# Patient Record
Sex: Female | Born: 1935
Health system: Southern US, Community
[De-identification: ages and names within clinical notes are randomized; demographics above are authoritative.]

## PROBLEM LIST (undated history)

## (undated) DIAGNOSIS — Z860100 Personal history of colon polyps, unspecified: Secondary | ICD-10-CM

## (undated) DIAGNOSIS — R519 Headache, unspecified: Secondary | ICD-10-CM

## (undated) DIAGNOSIS — E041 Nontoxic single thyroid nodule: Secondary | ICD-10-CM

## (undated) DIAGNOSIS — Z86018 Personal history of other benign neoplasm: Secondary | ICD-10-CM

## (undated) DIAGNOSIS — R9430 Abnormal result of cardiovascular function study, unspecified: Secondary | ICD-10-CM

## (undated) DIAGNOSIS — J45909 Unspecified asthma, uncomplicated: Secondary | ICD-10-CM

## (undated) DIAGNOSIS — R569 Unspecified convulsions: Secondary | ICD-10-CM

## (undated) DIAGNOSIS — K219 Gastro-esophageal reflux disease without esophagitis: Secondary | ICD-10-CM

## (undated) DIAGNOSIS — T8859XA Other complications of anesthesia, initial encounter: Secondary | ICD-10-CM

## (undated) DIAGNOSIS — E669 Obesity, unspecified: Secondary | ICD-10-CM

## (undated) DIAGNOSIS — J42 Unspecified chronic bronchitis: Secondary | ICD-10-CM

## (undated) DIAGNOSIS — R05 Cough: Secondary | ICD-10-CM

## (undated) DIAGNOSIS — R002 Palpitations: Secondary | ICD-10-CM

## (undated) DIAGNOSIS — K529 Noninfective gastroenteritis and colitis, unspecified: Secondary | ICD-10-CM

## (undated) DIAGNOSIS — C439 Malignant melanoma of skin, unspecified: Secondary | ICD-10-CM

## (undated) DIAGNOSIS — E538 Deficiency of other specified B group vitamins: Secondary | ICD-10-CM

## (undated) DIAGNOSIS — C50911 Malignant neoplasm of unspecified site of right female breast: Secondary | ICD-10-CM

## (undated) DIAGNOSIS — J302 Other seasonal allergic rhinitis: Secondary | ICD-10-CM

## (undated) DIAGNOSIS — R053 Chronic cough: Secondary | ICD-10-CM

## (undated) DIAGNOSIS — R51 Headache: Secondary | ICD-10-CM

## (undated) DIAGNOSIS — Z923 Personal history of irradiation: Secondary | ICD-10-CM

## (undated) DIAGNOSIS — G250 Essential tremor: Secondary | ICD-10-CM

## (undated) DIAGNOSIS — G5603 Carpal tunnel syndrome, bilateral upper limbs: Secondary | ICD-10-CM

## (undated) DIAGNOSIS — M1990 Unspecified osteoarthritis, unspecified site: Secondary | ICD-10-CM

## (undated) DIAGNOSIS — C189 Malignant neoplasm of colon, unspecified: Secondary | ICD-10-CM

## (undated) DIAGNOSIS — K589 Irritable bowel syndrome without diarrhea: Secondary | ICD-10-CM

## (undated) DIAGNOSIS — R55 Syncope and collapse: Secondary | ICD-10-CM

## (undated) DIAGNOSIS — C50919 Malignant neoplasm of unspecified site of unspecified female breast: Secondary | ICD-10-CM

## (undated) DIAGNOSIS — F419 Anxiety disorder, unspecified: Secondary | ICD-10-CM

## (undated) DIAGNOSIS — M810 Age-related osteoporosis without current pathological fracture: Secondary | ICD-10-CM

## (undated) DIAGNOSIS — M79673 Pain in unspecified foot: Secondary | ICD-10-CM

## (undated) DIAGNOSIS — IMO0002 Reserved for concepts with insufficient information to code with codable children: Secondary | ICD-10-CM

## (undated) DIAGNOSIS — I779 Disorder of arteries and arterioles, unspecified: Secondary | ICD-10-CM

## (undated) DIAGNOSIS — E871 Hypo-osmolality and hyponatremia: Secondary | ICD-10-CM

## (undated) DIAGNOSIS — I10 Essential (primary) hypertension: Secondary | ICD-10-CM

## (undated) DIAGNOSIS — E559 Vitamin D deficiency, unspecified: Secondary | ICD-10-CM

## (undated) DIAGNOSIS — G8929 Other chronic pain: Secondary | ICD-10-CM

## (undated) DIAGNOSIS — Z889 Allergy status to unspecified drugs, medicaments and biological substances status: Secondary | ICD-10-CM

## (undated) DIAGNOSIS — E785 Hyperlipidemia, unspecified: Secondary | ICD-10-CM

## (undated) DIAGNOSIS — T4145XA Adverse effect of unspecified anesthetic, initial encounter: Secondary | ICD-10-CM

## (undated) DIAGNOSIS — E876 Hypokalemia: Secondary | ICD-10-CM

## (undated) DIAGNOSIS — M2040 Other hammer toe(s) (acquired), unspecified foot: Secondary | ICD-10-CM

## (undated) DIAGNOSIS — C4490 Unspecified malignant neoplasm of skin, unspecified: Secondary | ICD-10-CM

## (undated) DIAGNOSIS — D649 Anemia, unspecified: Secondary | ICD-10-CM

## (undated) DIAGNOSIS — E039 Hypothyroidism, unspecified: Secondary | ICD-10-CM

## (undated) DIAGNOSIS — S92309A Fracture of unspecified metatarsal bone(s), unspecified foot, initial encounter for closed fracture: Secondary | ICD-10-CM

## (undated) HISTORY — PX: ULNAR TUNNEL RELEASE: SHX820

## (undated) HISTORY — DX: Palpitations: R00.2

## (undated) HISTORY — DX: Unspecified osteoarthritis, unspecified site: M19.90

## (undated) HISTORY — DX: Other chronic pain: G89.29

## (undated) HISTORY — DX: Anemia, unspecified: D64.9

## (undated) HISTORY — DX: Deficiency of other specified B group vitamins: E53.8

## (undated) HISTORY — DX: Personal history of colonic polyps: Z86.010

## (undated) HISTORY — DX: Obesity, unspecified: E66.9

## (undated) HISTORY — PX: HERNIA REPAIR: SHX51

## (undated) HISTORY — DX: Allergy status to unspecified drugs, medicaments and biological substances: Z88.9

## (undated) HISTORY — DX: Gastro-esophageal reflux disease without esophagitis: K21.9

## (undated) HISTORY — DX: Cough: R05

## (undated) HISTORY — DX: Other seasonal allergic rhinitis: J30.2

## (undated) HISTORY — PX: ABDOMINAL HYSTERECTOMY: SHX81

## (undated) HISTORY — DX: Abnormal result of cardiovascular function study, unspecified: R94.30

## (undated) HISTORY — PX: COLONOSCOPY: SHX174

## (undated) HISTORY — PX: CHOLECYSTECTOMY: SHX55

## (undated) HISTORY — DX: Headache: R51

## (undated) HISTORY — DX: Personal history of irradiation: Z92.3

## (undated) HISTORY — PX: BUNIONECTOMY: SHX129

## (undated) HISTORY — PX: KNEE ARTHROSCOPY: SHX127

## (undated) HISTORY — PX: CATARACT EXTRACTION W/ INTRAOCULAR LENS IMPLANT: SHX1309

## (undated) HISTORY — DX: Hyperlipidemia, unspecified: E78.5

## (undated) HISTORY — PX: EYE SURGERY: SHX253

## (undated) HISTORY — DX: Headache, unspecified: R51.9

## (undated) HISTORY — DX: Pain in unspecified foot: M79.673

## (undated) HISTORY — PX: BRAIN SURGERY: SHX531

## (undated) HISTORY — DX: Unspecified convulsions: R56.9

## (undated) HISTORY — DX: Essential tremor: G25.0

## (undated) HISTORY — DX: Personal history of colon polyps, unspecified: Z86.0100

## (undated) HISTORY — DX: Chronic cough: R05.3

## (undated) HISTORY — PX: POLYPECTOMY: SHX149

## (undated) HISTORY — DX: Essential (primary) hypertension: I10

## (undated) HISTORY — DX: Unspecified malignant neoplasm of skin, unspecified: C44.90

## (undated) HISTORY — DX: Anxiety disorder, unspecified: F41.9

## (undated) HISTORY — DX: Reserved for concepts with insufficient information to code with codable children: IMO0002

---

## 1997-07-24 ENCOUNTER — Ambulatory Visit (HOSPITAL_COMMUNITY): Admission: RE | Admit: 1997-07-24 | Discharge: 1997-07-24 | Payer: Self-pay | Admitting: Internal Medicine

## 1998-04-08 ENCOUNTER — Other Ambulatory Visit: Admission: RE | Admit: 1998-04-08 | Discharge: 1998-04-08 | Payer: Self-pay | Admitting: *Deleted

## 1998-05-01 ENCOUNTER — Ambulatory Visit (HOSPITAL_COMMUNITY): Admission: RE | Admit: 1998-05-01 | Discharge: 1998-05-01 | Payer: Self-pay | Admitting: *Deleted

## 1998-05-30 ENCOUNTER — Emergency Department (HOSPITAL_COMMUNITY): Admission: EM | Admit: 1998-05-30 | Discharge: 1998-05-30 | Payer: Self-pay | Admitting: Emergency Medicine

## 1998-07-23 ENCOUNTER — Ambulatory Visit (HOSPITAL_COMMUNITY): Admission: RE | Admit: 1998-07-23 | Discharge: 1998-07-23 | Payer: Self-pay | Admitting: *Deleted

## 1998-08-20 ENCOUNTER — Other Ambulatory Visit: Admission: RE | Admit: 1998-08-20 | Discharge: 1998-08-20 | Payer: Self-pay | Admitting: Specialist

## 1998-10-17 ENCOUNTER — Ambulatory Visit (HOSPITAL_COMMUNITY): Admission: RE | Admit: 1998-10-17 | Discharge: 1998-10-17 | Payer: Self-pay | Admitting: *Deleted

## 1999-03-21 ENCOUNTER — Ambulatory Visit (HOSPITAL_COMMUNITY): Admission: RE | Admit: 1999-03-21 | Discharge: 1999-03-21 | Payer: Self-pay | Admitting: Neurology

## 1999-03-21 ENCOUNTER — Encounter: Payer: Self-pay | Admitting: Neurology

## 1999-04-08 ENCOUNTER — Encounter: Admission: RE | Admit: 1999-04-08 | Discharge: 1999-06-02 | Payer: Self-pay | Admitting: Neurology

## 1999-04-29 ENCOUNTER — Other Ambulatory Visit: Admission: RE | Admit: 1999-04-29 | Discharge: 1999-04-29 | Payer: Self-pay | Admitting: Obstetrics & Gynecology

## 1999-05-12 ENCOUNTER — Encounter: Payer: Self-pay | Admitting: Obstetrics & Gynecology

## 1999-05-12 ENCOUNTER — Ambulatory Visit (HOSPITAL_COMMUNITY): Admission: RE | Admit: 1999-05-12 | Discharge: 1999-05-12 | Payer: Self-pay | Admitting: Obstetrics & Gynecology

## 1999-05-16 ENCOUNTER — Encounter: Admission: RE | Admit: 1999-05-16 | Discharge: 1999-05-16 | Payer: Self-pay | Admitting: Obstetrics & Gynecology

## 1999-05-16 ENCOUNTER — Encounter: Payer: Self-pay | Admitting: Obstetrics & Gynecology

## 1999-07-15 ENCOUNTER — Encounter: Payer: Self-pay | Admitting: General Surgery

## 1999-07-17 ENCOUNTER — Ambulatory Visit (HOSPITAL_COMMUNITY): Admission: RE | Admit: 1999-07-17 | Discharge: 1999-07-18 | Payer: Self-pay | Admitting: General Surgery

## 1999-07-17 ENCOUNTER — Encounter (INDEPENDENT_AMBULATORY_CARE_PROVIDER_SITE_OTHER): Payer: Self-pay | Admitting: *Deleted

## 2000-04-15 ENCOUNTER — Other Ambulatory Visit: Admission: RE | Admit: 2000-04-15 | Discharge: 2000-04-15 | Payer: Self-pay | Admitting: *Deleted

## 2000-05-12 ENCOUNTER — Ambulatory Visit (HOSPITAL_COMMUNITY): Admission: RE | Admit: 2000-05-12 | Discharge: 2000-05-12 | Payer: Self-pay | Admitting: *Deleted

## 2001-05-16 ENCOUNTER — Encounter: Payer: Self-pay | Admitting: Internal Medicine

## 2001-05-16 ENCOUNTER — Encounter: Admission: RE | Admit: 2001-05-16 | Discharge: 2001-05-16 | Payer: Self-pay | Admitting: Internal Medicine

## 2002-01-04 ENCOUNTER — Encounter (INDEPENDENT_AMBULATORY_CARE_PROVIDER_SITE_OTHER): Payer: Self-pay | Admitting: Specialist

## 2002-01-04 ENCOUNTER — Ambulatory Visit (HOSPITAL_COMMUNITY): Admission: RE | Admit: 2002-01-04 | Discharge: 2002-01-04 | Payer: Self-pay | Admitting: Gastroenterology

## 2002-05-11 ENCOUNTER — Encounter: Admission: RE | Admit: 2002-05-11 | Discharge: 2002-05-11 | Payer: Self-pay | Admitting: Internal Medicine

## 2002-08-21 ENCOUNTER — Encounter: Payer: Self-pay | Admitting: Obstetrics and Gynecology

## 2002-08-21 ENCOUNTER — Ambulatory Visit (HOSPITAL_COMMUNITY): Admission: RE | Admit: 2002-08-21 | Discharge: 2002-08-21 | Payer: Self-pay | Admitting: Obstetrics and Gynecology

## 2002-10-17 ENCOUNTER — Encounter: Admission: RE | Admit: 2002-10-17 | Discharge: 2002-10-17 | Payer: Self-pay | Admitting: Obstetrics and Gynecology

## 2002-10-17 ENCOUNTER — Encounter: Payer: Self-pay | Admitting: Obstetrics and Gynecology

## 2003-02-23 ENCOUNTER — Emergency Department (HOSPITAL_COMMUNITY): Admission: EM | Admit: 2003-02-23 | Discharge: 2003-02-23 | Payer: Self-pay | Admitting: Emergency Medicine

## 2003-07-31 ENCOUNTER — Encounter: Admission: RE | Admit: 2003-07-31 | Discharge: 2003-07-31 | Payer: Self-pay | Admitting: Obstetrics and Gynecology

## 2003-08-09 ENCOUNTER — Encounter: Admission: RE | Admit: 2003-08-09 | Discharge: 2003-08-09 | Payer: Self-pay | Admitting: General Surgery

## 2004-01-30 ENCOUNTER — Ambulatory Visit (HOSPITAL_COMMUNITY): Admission: RE | Admit: 2004-01-30 | Discharge: 2004-01-30 | Payer: Self-pay | Admitting: Gastroenterology

## 2004-03-05 ENCOUNTER — Ambulatory Visit: Payer: Self-pay | Admitting: Internal Medicine

## 2004-03-26 ENCOUNTER — Ambulatory Visit: Payer: Self-pay | Admitting: Internal Medicine

## 2004-04-16 ENCOUNTER — Ambulatory Visit: Payer: Self-pay | Admitting: Internal Medicine

## 2004-05-07 ENCOUNTER — Ambulatory Visit: Payer: Self-pay | Admitting: Internal Medicine

## 2004-05-28 ENCOUNTER — Ambulatory Visit: Payer: Self-pay | Admitting: Internal Medicine

## 2004-06-18 ENCOUNTER — Ambulatory Visit: Payer: Self-pay | Admitting: Internal Medicine

## 2004-07-08 ENCOUNTER — Ambulatory Visit: Payer: Self-pay | Admitting: Internal Medicine

## 2004-08-04 ENCOUNTER — Encounter: Admission: RE | Admit: 2004-08-04 | Discharge: 2004-08-04 | Payer: Self-pay | Admitting: Obstetrics and Gynecology

## 2004-08-06 ENCOUNTER — Ambulatory Visit: Payer: Self-pay | Admitting: Internal Medicine

## 2004-08-18 ENCOUNTER — Encounter: Admission: RE | Admit: 2004-08-18 | Discharge: 2004-08-18 | Payer: Self-pay | Admitting: Obstetrics and Gynecology

## 2004-08-27 ENCOUNTER — Ambulatory Visit: Payer: Self-pay | Admitting: Internal Medicine

## 2004-09-17 ENCOUNTER — Ambulatory Visit: Payer: Self-pay | Admitting: Internal Medicine

## 2004-09-18 ENCOUNTER — Encounter: Admission: RE | Admit: 2004-09-18 | Discharge: 2004-10-02 | Payer: Self-pay | Admitting: Neurology

## 2004-10-07 ENCOUNTER — Encounter: Admission: RE | Admit: 2004-10-07 | Discharge: 2005-01-05 | Payer: Self-pay | Admitting: Internal Medicine

## 2004-10-08 ENCOUNTER — Ambulatory Visit: Payer: Self-pay | Admitting: Internal Medicine

## 2004-11-12 ENCOUNTER — Ambulatory Visit: Payer: Self-pay | Admitting: Internal Medicine

## 2004-11-25 ENCOUNTER — Encounter: Admission: RE | Admit: 2004-11-25 | Discharge: 2004-11-25 | Payer: Self-pay | Admitting: Internal Medicine

## 2004-12-05 ENCOUNTER — Ambulatory Visit: Payer: Self-pay | Admitting: Internal Medicine

## 2004-12-15 ENCOUNTER — Other Ambulatory Visit: Admission: RE | Admit: 2004-12-15 | Discharge: 2004-12-15 | Payer: Self-pay | Admitting: Obstetrics and Gynecology

## 2005-01-09 ENCOUNTER — Ambulatory Visit: Payer: Self-pay | Admitting: Internal Medicine

## 2005-01-27 ENCOUNTER — Ambulatory Visit: Payer: Self-pay | Admitting: Internal Medicine

## 2005-02-23 ENCOUNTER — Ambulatory Visit: Payer: Self-pay | Admitting: Cardiology

## 2005-02-26 ENCOUNTER — Ambulatory Visit: Payer: Self-pay | Admitting: Cardiology

## 2005-06-10 ENCOUNTER — Ambulatory Visit: Payer: Self-pay | Admitting: Internal Medicine

## 2005-06-24 ENCOUNTER — Ambulatory Visit: Payer: Self-pay | Admitting: Internal Medicine

## 2005-06-25 ENCOUNTER — Ambulatory Visit: Payer: Self-pay | Admitting: Internal Medicine

## 2005-07-03 ENCOUNTER — Ambulatory Visit: Payer: Self-pay | Admitting: Pulmonary Disease

## 2005-07-08 ENCOUNTER — Ambulatory Visit: Payer: Self-pay | Admitting: Internal Medicine

## 2005-07-22 ENCOUNTER — Ambulatory Visit: Payer: Self-pay | Admitting: Internal Medicine

## 2005-07-22 ENCOUNTER — Ambulatory Visit: Payer: Self-pay | Admitting: Cardiology

## 2005-08-05 ENCOUNTER — Ambulatory Visit: Payer: Self-pay | Admitting: Internal Medicine

## 2005-08-17 ENCOUNTER — Encounter: Admission: RE | Admit: 2005-08-17 | Discharge: 2005-08-17 | Payer: Self-pay | Admitting: Internal Medicine

## 2005-08-19 ENCOUNTER — Ambulatory Visit: Payer: Self-pay | Admitting: Internal Medicine

## 2005-09-02 ENCOUNTER — Encounter: Payer: Self-pay | Admitting: Internal Medicine

## 2005-09-14 ENCOUNTER — Ambulatory Visit: Payer: Self-pay | Admitting: Internal Medicine

## 2005-10-07 ENCOUNTER — Ambulatory Visit: Payer: Self-pay | Admitting: Internal Medicine

## 2005-12-09 ENCOUNTER — Ambulatory Visit: Payer: Self-pay | Admitting: Internal Medicine

## 2005-12-14 ENCOUNTER — Ambulatory Visit: Payer: Self-pay | Admitting: Internal Medicine

## 2006-02-17 ENCOUNTER — Ambulatory Visit: Payer: Self-pay | Admitting: Internal Medicine

## 2006-02-26 ENCOUNTER — Ambulatory Visit: Payer: Self-pay | Admitting: Cardiology

## 2006-03-17 ENCOUNTER — Ambulatory Visit: Payer: Self-pay | Admitting: Internal Medicine

## 2006-04-14 ENCOUNTER — Ambulatory Visit: Payer: Self-pay | Admitting: Internal Medicine

## 2006-05-07 ENCOUNTER — Ambulatory Visit: Payer: Self-pay | Admitting: Internal Medicine

## 2006-06-16 ENCOUNTER — Ambulatory Visit: Payer: Self-pay | Admitting: Internal Medicine

## 2006-06-24 ENCOUNTER — Ambulatory Visit (HOSPITAL_COMMUNITY): Admission: RE | Admit: 2006-06-24 | Discharge: 2006-06-24 | Payer: Self-pay | Admitting: Gastroenterology

## 2006-07-12 ENCOUNTER — Encounter: Payer: Self-pay | Admitting: Internal Medicine

## 2006-07-14 ENCOUNTER — Ambulatory Visit: Payer: Self-pay | Admitting: Internal Medicine

## 2006-08-11 ENCOUNTER — Ambulatory Visit: Payer: Self-pay | Admitting: Internal Medicine

## 2006-08-13 ENCOUNTER — Ambulatory Visit: Payer: Self-pay

## 2006-08-23 ENCOUNTER — Encounter: Admission: RE | Admit: 2006-08-23 | Discharge: 2006-08-23 | Payer: Self-pay | Admitting: Obstetrics and Gynecology

## 2006-08-25 ENCOUNTER — Ambulatory Visit: Payer: Self-pay | Admitting: Internal Medicine

## 2006-09-06 ENCOUNTER — Ambulatory Visit: Payer: Self-pay | Admitting: Internal Medicine

## 2006-10-07 ENCOUNTER — Ambulatory Visit (HOSPITAL_COMMUNITY): Admission: RE | Admit: 2006-10-07 | Discharge: 2006-10-07 | Payer: Self-pay | Admitting: Specialist

## 2006-10-13 ENCOUNTER — Ambulatory Visit: Payer: Self-pay | Admitting: Internal Medicine

## 2006-10-29 DIAGNOSIS — J309 Allergic rhinitis, unspecified: Secondary | ICD-10-CM | POA: Insufficient documentation

## 2006-10-29 DIAGNOSIS — E785 Hyperlipidemia, unspecified: Secondary | ICD-10-CM

## 2006-10-29 DIAGNOSIS — E119 Type 2 diabetes mellitus without complications: Secondary | ICD-10-CM | POA: Insufficient documentation

## 2006-10-29 DIAGNOSIS — M1990 Unspecified osteoarthritis, unspecified site: Secondary | ICD-10-CM | POA: Insufficient documentation

## 2006-10-29 DIAGNOSIS — R51 Headache: Secondary | ICD-10-CM | POA: Insufficient documentation

## 2006-10-29 DIAGNOSIS — I10 Essential (primary) hypertension: Secondary | ICD-10-CM | POA: Insufficient documentation

## 2006-10-29 DIAGNOSIS — R519 Headache, unspecified: Secondary | ICD-10-CM | POA: Insufficient documentation

## 2006-10-29 DIAGNOSIS — E782 Mixed hyperlipidemia: Secondary | ICD-10-CM | POA: Insufficient documentation

## 2006-11-17 ENCOUNTER — Ambulatory Visit: Payer: Self-pay | Admitting: Internal Medicine

## 2006-11-24 ENCOUNTER — Telehealth: Payer: Self-pay | Admitting: *Deleted

## 2006-12-01 ENCOUNTER — Telehealth: Payer: Self-pay | Admitting: Internal Medicine

## 2006-12-01 DIAGNOSIS — R221 Localized swelling, mass and lump, neck: Secondary | ICD-10-CM

## 2006-12-01 DIAGNOSIS — R220 Localized swelling, mass and lump, head: Secondary | ICD-10-CM | POA: Insufficient documentation

## 2006-12-15 ENCOUNTER — Encounter: Admission: RE | Admit: 2006-12-15 | Discharge: 2006-12-15 | Payer: Self-pay | Admitting: Otolaryngology

## 2006-12-16 DIAGNOSIS — Z860100 Personal history of colon polyps, unspecified: Secondary | ICD-10-CM | POA: Insufficient documentation

## 2006-12-16 DIAGNOSIS — D649 Anemia, unspecified: Secondary | ICD-10-CM | POA: Insufficient documentation

## 2006-12-16 DIAGNOSIS — K219 Gastro-esophageal reflux disease without esophagitis: Secondary | ICD-10-CM | POA: Insufficient documentation

## 2006-12-22 ENCOUNTER — Ambulatory Visit: Payer: Self-pay | Admitting: Internal Medicine

## 2006-12-29 ENCOUNTER — Telehealth (INDEPENDENT_AMBULATORY_CARE_PROVIDER_SITE_OTHER): Payer: Self-pay | Admitting: *Deleted

## 2007-01-18 ENCOUNTER — Telehealth: Payer: Self-pay | Admitting: Internal Medicine

## 2007-01-24 ENCOUNTER — Encounter: Payer: Self-pay | Admitting: Internal Medicine

## 2007-02-02 ENCOUNTER — Ambulatory Visit: Payer: Self-pay | Admitting: Internal Medicine

## 2007-02-07 ENCOUNTER — Telehealth: Payer: Self-pay | Admitting: Internal Medicine

## 2007-02-18 ENCOUNTER — Encounter: Payer: Self-pay | Admitting: Internal Medicine

## 2007-03-07 ENCOUNTER — Ambulatory Visit: Payer: Self-pay | Admitting: Internal Medicine

## 2007-03-23 ENCOUNTER — Ambulatory Visit: Payer: Self-pay | Admitting: Internal Medicine

## 2007-03-28 ENCOUNTER — Telehealth: Payer: Self-pay | Admitting: Internal Medicine

## 2007-03-29 ENCOUNTER — Telehealth: Payer: Self-pay | Admitting: Internal Medicine

## 2007-04-15 ENCOUNTER — Encounter: Payer: Self-pay | Admitting: Internal Medicine

## 2007-04-18 ENCOUNTER — Encounter: Payer: Self-pay | Admitting: Internal Medicine

## 2007-04-29 ENCOUNTER — Telehealth: Payer: Self-pay | Admitting: Internal Medicine

## 2007-05-03 ENCOUNTER — Ambulatory Visit: Payer: Self-pay | Admitting: Internal Medicine

## 2007-05-03 LAB — CONVERTED CEMR LAB
Albumin: 3.4 g/dL — ABNORMAL LOW (ref 3.5–5.2)
BUN: 9 mg/dL (ref 6–23)
CO2: 31 meq/L (ref 19–32)
Calcium: 10 mg/dL (ref 8.4–10.5)
Chloride: 101 meq/L (ref 96–112)
Creatinine, Ser: 0.9 mg/dL (ref 0.4–1.2)
GFR calc Af Amer: 78 mL/min
GFR calc non Af Amer: 65 mL/min
Glucose, Bld: 248 mg/dL — ABNORMAL HIGH (ref 70–99)
Phosphorus: 3.7 mg/dL (ref 2.3–4.6)
Potassium: 4.3 meq/L (ref 3.5–5.1)
Sodium: 139 meq/L (ref 135–145)

## 2007-05-06 ENCOUNTER — Telehealth: Payer: Self-pay | Admitting: Internal Medicine

## 2007-05-19 ENCOUNTER — Telehealth: Payer: Self-pay | Admitting: Internal Medicine

## 2007-06-09 ENCOUNTER — Telehealth: Payer: Self-pay | Admitting: Internal Medicine

## 2007-06-29 ENCOUNTER — Ambulatory Visit: Payer: Self-pay | Admitting: Internal Medicine

## 2007-06-29 LAB — CONVERTED CEMR LAB
Bilirubin Urine: NEGATIVE
Glucose, Urine, Semiquant: NEGATIVE
Nitrite: POSITIVE
Specific Gravity, Urine: 1.025
Urobilinogen, UA: NEGATIVE
pH: 5.5

## 2007-06-30 ENCOUNTER — Encounter: Payer: Self-pay | Admitting: Internal Medicine

## 2007-07-25 ENCOUNTER — Encounter: Payer: Self-pay | Admitting: Internal Medicine

## 2007-08-03 ENCOUNTER — Encounter: Admission: RE | Admit: 2007-08-03 | Discharge: 2007-08-03 | Payer: Self-pay | Admitting: Orthopedic Surgery

## 2007-08-31 ENCOUNTER — Other Ambulatory Visit: Admission: RE | Admit: 2007-08-31 | Discharge: 2007-08-31 | Payer: Self-pay | Admitting: Obstetrics and Gynecology

## 2007-09-05 ENCOUNTER — Ambulatory Visit: Payer: Self-pay | Admitting: Internal Medicine

## 2007-09-20 ENCOUNTER — Encounter: Admission: RE | Admit: 2007-09-20 | Discharge: 2007-09-20 | Payer: Self-pay | Admitting: Internal Medicine

## 2007-09-21 ENCOUNTER — Ambulatory Visit: Payer: Self-pay | Admitting: Internal Medicine

## 2007-09-21 LAB — CONVERTED CEMR LAB
CRP, High Sensitivity: <1 — ABNORMAL LOW (ref 0.00–5.00)
Sed Rate: 10 mm/hr (ref 0–22)
Uric Acid, Serum: 4.8 mg/dL (ref 2.4–7.0)

## 2007-09-28 ENCOUNTER — Ambulatory Visit: Payer: Self-pay | Admitting: Internal Medicine

## 2007-09-28 LAB — CONVERTED CEMR LAB
BUN: 11 mg/dL (ref 6–23)
CO2: 26 meq/L (ref 19–32)
Calcium: 9.5 mg/dL (ref 8.4–10.5)
Chloride: 102 meq/L (ref 96–112)
Creatinine, Ser: 0.9 mg/dL (ref 0.4–1.2)
GFR calc Af Amer: 78 mL/min
GFR calc non Af Amer: 65 mL/min
Glucose, Bld: 122 mg/dL — ABNORMAL HIGH (ref 70–99)
Hgb A1c MFr Bld: 7.2 % — ABNORMAL HIGH (ref 4.6–6.0)
Potassium: 3.9 meq/L (ref 3.5–5.1)
Sodium: 137 meq/L (ref 135–145)

## 2007-10-28 ENCOUNTER — Encounter: Payer: Self-pay | Admitting: Internal Medicine

## 2007-12-21 ENCOUNTER — Ambulatory Visit: Payer: Self-pay | Admitting: Internal Medicine

## 2008-02-01 ENCOUNTER — Encounter: Payer: Self-pay | Admitting: Internal Medicine

## 2008-03-06 ENCOUNTER — Ambulatory Visit: Payer: Self-pay | Admitting: Internal Medicine

## 2008-03-06 DIAGNOSIS — R059 Cough, unspecified: Secondary | ICD-10-CM | POA: Insufficient documentation

## 2008-03-06 DIAGNOSIS — R05 Cough: Secondary | ICD-10-CM

## 2008-03-21 ENCOUNTER — Ambulatory Visit: Payer: Self-pay | Admitting: Internal Medicine

## 2008-03-21 DIAGNOSIS — K589 Irritable bowel syndrome without diarrhea: Secondary | ICD-10-CM | POA: Insufficient documentation

## 2008-03-21 DIAGNOSIS — H6980 Other specified disorders of Eustachian tube, unspecified ear: Secondary | ICD-10-CM | POA: Insufficient documentation

## 2008-03-23 ENCOUNTER — Telehealth (INDEPENDENT_AMBULATORY_CARE_PROVIDER_SITE_OTHER): Payer: Self-pay | Admitting: *Deleted

## 2008-05-17 ENCOUNTER — Ambulatory Visit: Payer: Self-pay | Admitting: Internal Medicine

## 2008-05-17 LAB — CONVERTED CEMR LAB
Cholesterol, target level: 200 mg/dL
HDL goal, serum: 40 mg/dL
LDL Goal: 100 mg/dL

## 2008-07-13 ENCOUNTER — Telehealth: Payer: Self-pay | Admitting: Internal Medicine

## 2008-08-07 ENCOUNTER — Encounter: Payer: Self-pay | Admitting: Internal Medicine

## 2008-08-16 ENCOUNTER — Ambulatory Visit: Payer: Self-pay | Admitting: Internal Medicine

## 2008-08-16 DIAGNOSIS — J0100 Acute maxillary sinusitis, unspecified: Secondary | ICD-10-CM | POA: Insufficient documentation

## 2008-09-05 ENCOUNTER — Ambulatory Visit: Payer: Self-pay | Admitting: Internal Medicine

## 2008-09-20 ENCOUNTER — Encounter: Admission: RE | Admit: 2008-09-20 | Discharge: 2008-09-20 | Payer: Self-pay | Admitting: Internal Medicine

## 2008-10-04 ENCOUNTER — Ambulatory Visit: Payer: Self-pay | Admitting: Internal Medicine

## 2008-10-04 LAB — CONVERTED CEMR LAB
Basophils Absolute: 0 10*3/uL (ref 0.0–0.1)
Basophils Relative: 0.1 % (ref 0.0–3.0)
Eosinophils Absolute: 0.1 10*3/uL (ref 0.0–0.7)
Eosinophils Relative: 1.1 % (ref 0.0–5.0)
HCT: 35.4 % — ABNORMAL LOW (ref 36.0–46.0)
Hemoglobin: 11.8 g/dL — ABNORMAL LOW (ref 12.0–15.0)
Iron: 91 ug/dL (ref 42–145)
Lymphocytes Relative: 36.6 % (ref 12.0–46.0)
Lymphs Abs: 2 10*3/uL (ref 0.7–4.0)
MCHC: 33.3 g/dL (ref 30.0–36.0)
MCV: 91.2 fL (ref 78.0–100.0)
Monocytes Absolute: 0.4 10*3/uL (ref 0.1–1.0)
Monocytes Relative: 6.6 % (ref 3.0–12.0)
Neutro Abs: 3 10*3/uL (ref 1.4–7.7)
Neutrophils Relative %: 55.6 % (ref 43.0–77.0)
Platelets: 262 10*3/uL (ref 150.0–400.0)
RBC: 3.88 M/uL (ref 3.87–5.11)
RDW: 13.1 % (ref 11.5–14.6)
TSH: 2.95 microintl units/mL (ref 0.35–5.50)
WBC: 5.5 10*3/uL (ref 4.5–10.5)

## 2008-10-11 ENCOUNTER — Ambulatory Visit: Payer: Self-pay | Admitting: Internal Medicine

## 2008-11-12 ENCOUNTER — Encounter: Payer: Self-pay | Admitting: Internal Medicine

## 2008-11-12 LAB — CONVERTED CEMR LAB: Hgb A1c MFr Bld: 7 %

## 2009-01-17 ENCOUNTER — Ambulatory Visit: Payer: Self-pay | Admitting: Internal Medicine

## 2009-01-18 ENCOUNTER — Telehealth: Payer: Self-pay | Admitting: Internal Medicine

## 2009-02-11 ENCOUNTER — Encounter: Payer: Self-pay | Admitting: Internal Medicine

## 2009-03-05 ENCOUNTER — Ambulatory Visit: Payer: Self-pay | Admitting: Internal Medicine

## 2009-03-05 DIAGNOSIS — J45909 Unspecified asthma, uncomplicated: Secondary | ICD-10-CM | POA: Insufficient documentation

## 2009-04-11 ENCOUNTER — Ambulatory Visit: Payer: Self-pay | Admitting: Internal Medicine

## 2009-05-28 ENCOUNTER — Encounter: Payer: Self-pay | Admitting: Internal Medicine

## 2009-07-11 ENCOUNTER — Ambulatory Visit: Payer: Self-pay | Admitting: Internal Medicine

## 2009-07-11 DIAGNOSIS — K911 Postgastric surgery syndromes: Secondary | ICD-10-CM | POA: Insufficient documentation

## 2009-07-11 DIAGNOSIS — G44209 Tension-type headache, unspecified, not intractable: Secondary | ICD-10-CM | POA: Insufficient documentation

## 2009-08-09 ENCOUNTER — Encounter: Payer: Self-pay | Admitting: Internal Medicine

## 2009-08-26 ENCOUNTER — Encounter: Payer: Self-pay | Admitting: Internal Medicine

## 2009-09-03 ENCOUNTER — Ambulatory Visit: Payer: Self-pay | Admitting: Internal Medicine

## 2009-09-05 ENCOUNTER — Ambulatory Visit: Payer: Self-pay | Admitting: Internal Medicine

## 2009-09-09 LAB — CONVERTED CEMR LAB
BUN: 12 mg/dL (ref 6–23)
Basophils Absolute: 0 10*3/uL (ref 0.0–0.1)
Basophils Relative: 0.5 % (ref 0.0–3.0)
CO2: 30 meq/L (ref 19–32)
Calcium: 9.2 mg/dL (ref 8.4–10.5)
Chloride: 103 meq/L (ref 96–112)
Cholesterol: 151 mg/dL (ref 0–200)
Creatinine, Ser: 0.7 mg/dL (ref 0.4–1.2)
Direct LDL: 85.7 mg/dL
Eosinophils Absolute: 0.1 10*3/uL (ref 0.0–0.7)
Eosinophils Relative: 0.8 % (ref 0.0–5.0)
Folate: 10.9 ng/mL
GFR calc non Af Amer: 83.38 mL/min (ref >60)
Glucose, Bld: 184 mg/dL — ABNORMAL HIGH (ref 70–99)
HCT: 32.5 % — ABNORMAL LOW (ref 36.0–46.0)
HDL: 42.1 mg/dL (ref >39.00)
Hemoglobin: 11.3 g/dL — ABNORMAL LOW (ref 12.0–15.0)
Hgb A1c MFr Bld: 7.4 % — ABNORMAL HIGH (ref 4.6–6.5)
Lymphocytes Relative: 28 % (ref 12.0–46.0)
Lymphs Abs: 1.7 10*3/uL (ref 0.7–4.0)
MCHC: 34.8 g/dL (ref 30.0–36.0)
MCV: 90.1 fL (ref 78.0–100.0)
Monocytes Absolute: 0.4 10*3/uL (ref 0.1–1.0)
Monocytes Relative: 6.5 % (ref 3.0–12.0)
Neutro Abs: 3.9 10*3/uL (ref 1.4–7.7)
Neutrophils Relative %: 64.2 % (ref 43.0–77.0)
Platelets: 260 10*3/uL (ref 150.0–400.0)
Potassium: 4.5 meq/L (ref 3.5–5.1)
RBC: 3.61 M/uL — ABNORMAL LOW (ref 3.87–5.11)
RDW: 13.9 % (ref 11.5–14.6)
Sodium: 140 meq/L (ref 135–145)
TSH: 2.86 microintl units/mL (ref 0.35–5.50)
Vitamin B-12: 501 pg/mL (ref 211–911)
WBC: 6 10*3/uL (ref 4.5–10.5)

## 2009-09-16 ENCOUNTER — Telehealth: Payer: Self-pay | Admitting: Internal Medicine

## 2009-09-23 ENCOUNTER — Encounter: Admission: RE | Admit: 2009-09-23 | Discharge: 2009-09-23 | Payer: Self-pay | Admitting: Internal Medicine

## 2009-12-02 ENCOUNTER — Encounter: Payer: Self-pay | Admitting: Internal Medicine

## 2009-12-05 ENCOUNTER — Ambulatory Visit: Payer: Self-pay | Admitting: Internal Medicine

## 2009-12-05 LAB — CONVERTED CEMR LAB
Cholesterol: 126 mg/dL
Glucose, Bld: 188 mg/dL
HDL: 48 mg/dL
Hgb A1c MFr Bld: 8.2 %

## 2009-12-06 ENCOUNTER — Encounter: Admission: RE | Admit: 2009-12-06 | Discharge: 2010-01-30 | Payer: Self-pay | Admitting: Internal Medicine

## 2010-03-04 ENCOUNTER — Ambulatory Visit: Payer: Self-pay | Admitting: Internal Medicine

## 2010-03-06 ENCOUNTER — Ambulatory Visit: Payer: Self-pay | Admitting: Internal Medicine

## 2010-03-06 DIAGNOSIS — M810 Age-related osteoporosis without current pathological fracture: Secondary | ICD-10-CM | POA: Insufficient documentation

## 2010-03-06 LAB — CONVERTED CEMR LAB: Hgb A1c MFr Bld: 7.6 % — ABNORMAL HIGH (ref 4.6–6.5)

## 2010-03-11 ENCOUNTER — Ambulatory Visit: Payer: Self-pay | Admitting: Internal Medicine

## 2010-03-11 ENCOUNTER — Encounter: Payer: Self-pay | Admitting: Internal Medicine

## 2010-03-24 ENCOUNTER — Encounter: Payer: Self-pay | Admitting: Internal Medicine

## 2010-05-15 ENCOUNTER — Ambulatory Visit
Admission: RE | Admit: 2010-05-15 | Discharge: 2010-05-15 | Payer: Self-pay | Source: Home / Self Care | Attending: Internal Medicine | Admitting: Internal Medicine

## 2010-05-25 ENCOUNTER — Encounter: Payer: Self-pay | Admitting: Obstetrics and Gynecology

## 2010-05-25 ENCOUNTER — Encounter: Payer: Self-pay | Admitting: Internal Medicine

## 2010-06-04 ENCOUNTER — Telehealth: Payer: Self-pay | Admitting: *Deleted

## 2010-06-04 DIAGNOSIS — N390 Urinary tract infection, site not specified: Secondary | ICD-10-CM

## 2010-06-04 MED ORDER — CIPROFLOXACIN HCL 250 MG PO TABS: 250.0000 mg | ORAL_TABLET | Freq: Two times a day (BID) | ORAL | Status: AC

## 2010-06-04 NOTE — Telephone Encounter (Signed)
Pt would like to have a RX or be seen for UTI from Dr. Lovell Sheehan. Dysuria, and bladder pain x one week.

## 2010-06-04 NOTE — Telephone Encounter (Signed)
Notified pt meds are sent in.

## 2010-06-04 NOTE — Telephone Encounter (Signed)
Have sent cipro to her pharmacy

## 2010-06-05 NOTE — Assessment & Plan Note (Signed)
Summary: rov 6 months///kp   Primary Provider/Referring Provider:  Stacie Glaze MD  CC:  6 month follow up visit-asthma and allergies..  History of Present Illness:  09/05/08- Allergic rhinitis, multiple environmental sensitivities Distraught- dog just died, now alone.  Difficult spring pollen season, esp in March. Now persstent dry cough. Right maxillary area soreness . Right ear pressure discomfort. Astepro helps but too sweet. Denies wheeze, purulent or bloody discharge .  March 05, 2009- Allergic rhinitis, multiple environmental sensitivities Was on antibbiotic for cataract surgery. since she finished that she notes a little supraorbital tenderness on right. Fexofenadine helps drainage in throat but dries her eye. Using an eye ointment. Had pneumovax again this year- got red large local reaction. Got flu vax. Denies chest problems- cough or wheeze.  [name]  3, 2011- Allergic rhinitis, multiple environmental sensitivities Staying in to avoid pollen. Inside or right nostril gets sore. Astelin has helped her and makes it easier to tolerate odors.[name] occasional cough and asks about a cough suppressant she can carry in her purse. Gets right occipital headaches. She is concerned about the shape of her right clavicle.  March 04, 2010- Allergic rhinitis, multiple environmental sensitivities Nurse-CC: 6 month follow up visit-asthma and allergies. CXR 09/2009- NAD. Had flu vax and up to date w/ pneumovax.  She reports right frontal and maxillary pressure pain for a month. She mows lawn herself w/ riding mower. Says this usually triggers her. Coughs at night- using Delsym. Asks about dust masks- recommended.  Clear thick mucus. Likes Astelin but got notice it won't be available so we discussed Astepro.  Cough comes at intervals. She uses Qvar in stretches when needed.    Asthma History    Initial Asthma Severity Rating:    Age range: 12+ years    Symptoms: 0-2 days/week    Nighttime  Awakenings: 0-2/month    Interferes w/ normal activity: no limitations    SABA use (not for EIB): 0-2 days/week    Asthma Severity Assessment: Intermittent   Preventive Screening-Counseling & Management  Alcohol-Tobacco     Smoking Status: never  Current Medications (verified): 1)  Boniva   Tabs (Ibandronate Sodium Tabs) .... Every Month 2)  Starlix 120 Mg  Tabs (Nateglinide) .... Take 1 Tablet By Mouth Three Times A Day 3)  Glucophage 500 Mg  Tabs (Metformin Hcl) .... Take 2 By Mouth Bid 4)  Calcium 600/vitamin D   Tabs (Calcium Carbonate-Vitamin D Tabs) .... Take 1 Tablet By Mouth Two Times A Day 5)  Red Yeast Rice Extract   Caps (Red Yeast Rice Extract Caps) .... Take 1 Tablet By Mouth Once A Day 6)  Alprazolam   Tabs (Alprazolam Tabs) 7)  Cyanocobalamin 1000 Mcg/ml Soln (Cyanocobalamin) .... One Cc Im Evey 3 Weeks 8)  Co Q-10 Plus Red Yeast Rice 60-600 Mg  Caps (Coenzyme Q10-Red Yeast Rice) .Marland Kitchen.. 1 Once Daily 9)  Red Wine Extract Plus   Caps (Misc Natural Products) .... Once Daily 10)  Alprazolam 0.25 Mg  Tabs (Alprazolam) .... As Needed 11)  Azelastine Hcl 137 Mcg/spray Soln (Azelastine Hcl) .... Two Spray in Each Nostril Two Times A Day 12)  Tarka 2-180 Mg  Tbcr (Trandolapril-Verapamil Hcl) .Marland Kitchen.. 1 Once Daily 13)  Zantac 150 Maximum Strength 150 Mg  Tabs (Ranitidine Hcl) .... Otc At The Drug Stor One A Night For At Least 2 Weeks Then As Needed  When You Feel Bloated 14)  Allegra 60 Mg  Tabs (Fexofenadine Hcl) .Marland KitchenMarland KitchenMarland Kitchen  1 Two Times A Day As Needed For Allergy 15)  Qvar 80 Mcg/act Aers (Beclomethasone Dipropionate) .... 2 Puffs and Rinse, Twice Daily 16)  Furosemide 20 Mg Tabs (Furosemide) .Marland Kitchen.. 1 Once Daily As Needed 17)  D 1000 Plus  Tabs (Fa-Cyanocobalamin-B6-D-Ca) .Marland Kitchen.. 1 Two Times A Day 18)  Benzonatate 100 Mg Caps (Benzonatate) .... One By Mouth Q 6 Hours As Needed Cough 19)  Ferrous Sulfate 325 (65 Fe) Mg Tbec (Ferrous Sulfate) .Marland Kitchen.. 1 Once Daily 20)  Colestid 5 Gm Gran (Colestipol  Hcl) .... 5gm in Water Daily ( [place] Have Flavors If She Desires) in Am As Directed  Allergies (verified): 1)  ! * Ivp Dye 2)  ! Duricef 3)  ! Sulfa 4)  ! Penicillin 5)  ! Tegretol 6)  ! Questran 7)  ! Ru-Tuss 8)  ! Actonel 9)  ! Atacand 10)  ! Norvasc 11)  ! Mobic 12)  ! * Bextra 13)  ! Micardis 14)  ! Asa 15)  ! * Zephrex La 16)  ! Tarka 17)  ! Sular 18)  ! Morphine 19)  ! Omnicef 20)  ! Clonidine Hcl 21)  ! Catapres 22)  ! Verapamil Hcl 23)  ! Solu-Medrol 24)  ! * Dust 25)  ! * Smoke 26)  ! * Ivp Dye 27)  ! Duricef 28)  ! * Sulphur 29)  ! Penicillin 30)  ! Tegretol 31)  ! * Cinebac 32)  ! * Nicobid 33)  ! * Questran 34)  ! * Ru-Tuss 35)  ! * Actonel 36)  ! * Atacand 37)  ! Norvasc 38)  ! * Mobic 39)  ! * Bextra 40)  ! * Ketek 41)  ! * Prevachol 42)  ! Hydrocodone 43)  ! Relafen 44)  ! * Niaspan 45)  ! Vicodin 46)  ! * Zephrex La 47)  ! Aspirin 48)  ! Seldane 49)  ! Codeine 50)  ! Vioxx 51)  ! Naprosyn 52)  ! * Zetia 53)  ! Celebrex 54)  ! * Loetrel 55)  ! * Micardis 56)  ! * Ziac 57)  ! * Tarka 58)  ! * Sular 59)  ! Morphine 60)  ! * Omnicef 61)  ! * Questran 62)  ! * Optivar Eye Drops 63)  ! * Clonodine 64)  ! Verapamil 65)  ! * Solumderol 66)  ! * Pneumonia 67)  ! * Pollen 68)  ! * Mold  Past History:  Past Medical History: Last updated: 12/16/2006 Allergies Diabetes mellitus, type II Hypertension Osteoarthritis Palpatations Allergic rhinitis Anemia-NOS Colonic polyps, hx of GERD Hyperlipidemia Chronic Facial Pain Acral Numbness Low Vit B12 Levels Upper Arm Knots Dylipidemia Chronic Foot Pain Chronic Dry Cough Obesity  Past Surgical History: Last updated: 12/16/2006 Colonoscopy Small Adenomatous polyp removal  Family History: Last updated: 12/16/2006 Family History of Cardiovascular disorder Family History Diabetes 1st degree relative Family History of Colon CA 1st degree relative <60  Social History: Last  updated: 09/05/2007 Retired Single Patient never smoked.   Risk Factors: Exercise: no (03/23/2007)  Risk Factors: Smoking Status: never (03/04/2010)  Review of Systems      See HPI       The patient complains of non-productive cough, headaches, and nasal congestion/difficulty breathing through nose.  The patient denies shortness of breath with activity, shortness of breath at rest, productive cough, coughing up blood, chest pain, irregular heartbeats, acid heartburn, indigestion, loss of appetite, weight change, abdominal pain, difficulty swallowing,  sore throat, tooth/dental problems, and sneezing.    Vital Signs:  Patient profile:   77 year old female Height:      68 inches Weight:      152.38 pounds BMI:     23.25 O2 Sat:      99 % on Room air Pulse rate:   58 / minute BP sitting:   124 / 68  (left arm) Cuff size:   regular  Vitals Entered By: Reynaldo Minium CMA (March 04, 2010 9:49 AM)  O2 Flow:  Room air CC: 6 month follow up visit-asthma and allergies.   Physical Exam  Additional Exam:  General: A/Ox3; pleasant and cooperative, NAD, talkative SKIN: no rash, lesions NODES: no lymphadenopathy HEENT: [place]/AT, EOM- WNL, Conjuctivae- clear, PERRLA, TM-WNL, Nose- clear, Throat- clear and wnl, dry oral mucosa. Mallampati  II NECK: Supple w/ fair ROM, JVD- none, normal carotid impulses w/o bruits Thyroid- CHEST: Clear to P&A, no cough or wheeze. HEART: RRR, no m/g/r heard ABDOMEN: medium build GEX:BMWU, nl pulses, no edema  NEURO: Grossly intact to observation      Impression & Recommendations:  Problem # 1:  ALLERGIC RHINITIS (ICD-477.9)  Rhinitis with headache seems to trigger with her mowing. She will try more use of a mask and will try Astepro. We can give neb today.  The following medications were removed from the medication list:    Rhinocort Aqua 32 Mcg/act Susp (Budesonide (nasal))    Nasonex 50 Mcg/act Susp (Mometasone furoate) .Marland Kitchen..Marland Kitchen Two sprays in the  nostril daily Her updated medication list for this problem includes:    Astepro 0.15 % Soln (Azelastine hcl) .Marland Kitchen... 1-2 puffs each nostril two times a day as needed    Allegra 60 Mg Tabs (Fexofenadine hcl) .Marland Kitchen... 1 two times a day as needed for allergy    Promethazine Hcl 25 Mg Tabs (Promethazine hcl) .Marland Kitchen... 1 every 4-6 hours as needed nausea and vomiting  Problem # 2:  ASTHMA (ICD-493.90) She describes some occasional cough. We discussed use of benzonatate and Qvar especially to make her more comfortable at church.   Medications Added to Medication List This Visit: 1)  Astepro 0.15 % Soln (Azelastine hcl) .Marland Kitchen.. 1-2 puffs each nostril two times a day as needed  Other Orders: Est. Patient Level IV (13244) Nebulizer Tx (01027)  Patient Instructions: 1)  Please schedule a follow-up appointment in 6 months. 2)  neb neo nasal 3)  Sample and script for Astepro nasal spray to try instead of Astelin. 4)    1-2 sprays each nostril twice daily when needed.  5)  Refilled meds. 6)  Try using benzonatate and Qvar before church to see if that will keep down the coughing.  7)  Ask drug store for Allegra, or the generic fexofenadine for an antihistamine Prescriptions: BENZONATATE 100 MG CAPS (BENZONATATE) one by mouth q 6 hours as needed cough  #90 x 3   Entered and Authorized by:   Waymon Budge MD   Signed by:   Waymon Budge MD on 03/04/2010   Method used:   Print then Give to Patient   RxID:   [id number] QVAR 80 MCG/ACT AERS (BECLOMETHASONE DIPROPIONATE) 2 puffs and rinse, twice daily  #3 x 3   Entered and Authorized by:   Waymon Budge MD   Signed by:   Waymon Budge MD on 03/04/2010   Method used:   Print then Give to Patient   RxID:   [id number] ASTEPRO 0.15 %  SOLN (AZELASTINE HCL) 1-2 puffs each nostril two times a day as needed  #3 x 3   Entered and Authorized by:   Waymon Budge MD   Signed by:   Waymon Budge MD on 03/04/2010   Method used:   Print then Give to  Patient   RxID:   [id number]      Medication Administration  Medication # 1:    Medication: EMR miscellaneous medications    Diagnosis: ALLERGIC RHINITIS (ICD-477.9)    Dose: 3 drops    Route: intranasal    Exp Date: 08/2011    Lot #: [id number]    Mfr: Bayer    Comments: Neo-Synephrine    Patient tolerated medication without complications    Given by: Katherine Mantle  Orders Added: 1)  Est. Patient Level IV [62130] 2)  Nebulizer Tx [86578]

## 2010-06-05 NOTE — Assessment & Plan Note (Signed)
Summary: 3 MONTH ROV./NRJ   Vital Signs:  Patient profile:   77 year old female Height:      68 inches Weight:      150 pounds BMI:     22.89 Temp:     98.2 degrees F oral Pulse rate:   72 / minute Resp:     14 per minute BP sitting:   132 / 78  (left arm)  Vitals Entered By: Willy Eddy, LPN (April 11, 2009 10:21 AM) CC: Hypertension Management   Primary Care Provider:  Stacie Glaze MD  CC:  Hypertension Management.  History of Present Illness: weight loss, but has not been trying but has a diarrhea syndrome for about oe week recently that has resolved She has IBS and possibly medication sensitivity Has increased nasal congestion and apparent eustation tube dysfunction using astelin but no nasal steriods hewr hypertension is under control  Hypertension History:      She denies headache, chest pain, palpitations, dyspnea with exertion, orthopnea, PND, peripheral edema, visual symptoms, neurologic problems, syncope, and side effects from treatment.        Positive major cardiovascular risk factors include female age 57 years old or older, diabetes, hyperlipidemia, and hypertension.  Negative major cardiovascular risk factors include non-tobacco-user status.     Preventive Screening-Counseling & Management  Alcohol-Tobacco     Smoking Status: never  Problems Prior to Update: 1)  Asthma  (ICD-493.90) 2)  Acute Maxillary Sinusitis  (ICD-461.0) 3)  Eustachian Tube Dysfunction, Right  (ICD-381.81) 4)  Irritable Bowel Syndrome  (ICD-564.1) 5)  Cough  (ICD-786.2) 6)  Family History of Colon Ca 1st Degree Relative <60  (ICD-V16.0) 7)  Family History Diabetes 1st Degree Relative  (ICD-V18.0) 8)  Hyperlipidemia  (ICD-272.4) 9)  Gerd  (ICD-530.81) 10)  Colonic Polyps, Hx of  (ICD-V12.72) 11)  Anemia-nos  (ICD-285.9) 12)  Allergic Rhinitis  (ICD-477.9) 13)  Osteoarthritis  (ICD-715.90) 14)  Hypertension  (ICD-401.9) 15)  Diabetes Mellitus, Type II   (ICD-250.00)  Medications Prior to Update: 1)  Cyanocobalamin 1000 Mcg/ml Soln (Cyanocobalamin) .... One Cc Im Evey 3 Weeks 2)  Starlix 120 Mg  Tabs (Nateglinide) .... Tid 3)  Glucophage 500 Mg  Tabs (Metformin Hcl) .... Tid 4)  Co Q-10 Plus Red Yeast Rice 60-600 Mg  Caps (Coenzyme Q10-Red Yeast Rice) .Marland Kitchen.. 1 Once Daily 5)  Red Wine Extract Plus   Caps (Misc Natural Products) .... Once Daily 6)  Alprazolam 0.25 Mg  Tabs (Alprazolam) .... As Needed 7)  Boniva 150 Mg  Tabs (Ibandronate Sodium) .... One By Mouth Monthy 8)  Astelin 137 Mcg/spray  Soln (Azelastine Hcl) .Marland Kitchen.. 1-2 Puffs Each Nostril Up To Twice Daily If Needed 9)  Tarka 2-180 Mg  Tbcr (Trandolapril-Verapamil Hcl) .Marland Kitchen.. 1 Once Daily 10)  Zantac 150 Maximum Strength 150 Mg  Tabs (Ranitidine Hcl) .... Otc At The Drug Stor One A Night For At Least 2 Weeks Then As Needed  When You Feel Bloated 11)  Solaraze 3 %  Gel (Diclofenac Sodium) .... Apply Twicedaily To Area 12)  Allegra 60 Mg  Tabs (Fexofenadine Hcl) .Marland Kitchen.. 1 Two Times A Day As Needed For Allergy 13)  Qvar 80 Mcg/act Aers (Beclomethasone Dipropionate) .... 2 Puffs and Rinse, Twice Daily 14)  Furosemide 20 Mg Tabs (Furosemide) .Marland Kitchen.. 1 Once Daily As Needed 15)  Lovaza 1 Gm Caps (Omega-3-Acid Ethyl Esters) .... 2 Two Times A Day 16)  Calcium 600/vitamin D 600-400 Mg-Unit Chew (Calcium Carbonate-Vitamin D) .Marland KitchenMarland KitchenMarland Kitchen  1 Two Times A Day 17)  D 1000 Plus  Tabs (Fa-Cyanocobalamin-B6-D-Ca) .Marland Kitchen.. 1 Two Times A Day 18)  K-Tabs 10 Meq Cr-Tabs (Potassium Chloride) .Marland Kitchen.. 1 Once Daily 19)  Benzonatate 100 Mg Caps (Benzonatate) .... One By Mouth Q 6 Hours As Needed Cough 20)  Ferrous Sulfate 325 (65 Fe) Mg Tbec (Ferrous Sulfate) .... One By Mouth Three Times A Week  Current Medications (verified): 1)  Cyanocobalamin 1000 Mcg/ml Soln (Cyanocobalamin) .... One Cc Im Evey 3 Weeks 2)  Starlix 120 Mg  Tabs (Nateglinide) .... Tid 3)  Glucophage 500 Mg  Tabs (Metformin Hcl) .... Tid 4)  Co Q-10 Plus Red Yeast  Rice 60-600 Mg  Caps (Coenzyme Q10-Red Yeast Rice) .Marland Kitchen.. 1 Once Daily 5)  Red Wine Extract Plus   Caps (Misc Natural Products) .... Once Daily 6)  Alprazolam 0.25 Mg  Tabs (Alprazolam) .... As Needed 7)  Boniva 150 Mg  Tabs (Ibandronate Sodium) .... One By Mouth Monthy 8)  Astelin 137 Mcg/spray  Soln (Azelastine Hcl) .Marland Kitchen.. 1-2 Puffs Each Nostril Up To Twice Daily If Needed 9)  Tarka 2-180 Mg  Tbcr (Trandolapril-Verapamil Hcl) .Marland Kitchen.. 1 Once Daily 10)  Zantac 150 Maximum Strength 150 Mg  Tabs (Ranitidine Hcl) .... Otc At The Drug Stor One A Night For At Least 2 Weeks Then As Needed  When You Feel Bloated 11)  Solaraze 3 %  Gel (Diclofenac Sodium) .... Apply Twicedaily To Area 12)  Allegra 60 Mg  Tabs (Fexofenadine Hcl) .Marland Kitchen.. 1 Two Times A Day As Needed For Allergy 13)  Qvar 80 Mcg/act Aers (Beclomethasone Dipropionate) .... 2 Puffs and Rinse, Twice Daily 14)  Furosemide 20 Mg Tabs (Furosemide) .Marland Kitchen.. 1 Once Daily As Needed 15)  Lovaza 1 Gm Caps (Omega-3-Acid Ethyl Esters) .... 2 Two Times A Day 16)  Calcium 600/vitamin D 600-400 Mg-Unit Chew (Calcium Carbonate-Vitamin D) .Marland Kitchen.. 1 Two Times A Day 17)  D 1000 Plus  Tabs (Fa-Cyanocobalamin-B6-D-Ca) .Marland Kitchen.. 1 Two Times A Day 18)  K-Tabs 10 Meq Cr-Tabs (Potassium Chloride) .Marland Kitchen.. 1 Once Daily 19)  Benzonatate 100 Mg Caps (Benzonatate) .... One By Mouth Q 6 Hours As Needed Cough 20)  Ferrous Sulfate 325 (65 Fe) Mg Tbec (Ferrous Sulfate) .... One By Mouth Three Times A Week  Allergies (verified): 1)  ! * Dust 2)  ! * Smoke 3)  ! * Ivp Dye 4)  ! Duricef 5)  ! * Sulphur 6)  ! Penicillin 7)  ! Tegretol 8)  ! * Cinebac 9)  ! * Nicobid 10)  ! * Questran 11)  ! * Ru-Tuss 12)  ! * Actonel 13)  ! * Atacand 14)  ! Norvasc 15)  ! * Mobic 16)  ! * Bextra 17)  ! * Ketek 18)  ! * Prevachol 19)  ! Hydrocodone 20)  ! Relafen 21)  ! * Niaspan 22)  ! Vicodin 23)  ! * Zephrex La 24)  ! Aspirin 25)  ! Seldane 26)  ! Codeine 27)  ! Vioxx 28)  ! Naprosyn 29)  ! *  Zetia 30)  ! Celebrex 31)  ! * Loetrel 32)  ! * Micardis 33)  ! * Ziac 34)  ! * Tarka 35)  ! * Sular 36)  ! Morphine 37)  ! * Omnicef 38)  ! * Questran 39)  ! * Optivar Eye Drops 40)  ! * Clonodine 41)  ! Verapamil 42)  ! * Solumderol 43)  ! * Pneumonia 44)  ! *  Pollen 45)  ! * Mold  Past History:  Family History: Last updated: 12/16/2006 Family History of Cardiovascular disorder Family History Diabetes 1st degree relative Family History of Colon CA 1st degree relative <60  Social History: Last updated: 09/05/2007 Retired Single Patient never smoked.   Risk Factors: Exercise: no (03/23/2007)  Risk Factors: Smoking Status: never (04/11/2009)  Past medical, surgical, family and social histories (including risk factors) reviewed, and no changes noted (except as noted below).  Past Medical History: Reviewed history from 12/16/2006 and no changes required. Allergies Diabetes mellitus, type II Hypertension Osteoarthritis Palpatations Allergic rhinitis Anemia-NOS Colonic polyps, hx of GERD Hyperlipidemia Chronic Facial Pain Acral Numbness Low Vit B12 Levels Upper Arm Knots Dylipidemia Chronic Foot Pain Chronic Dry Cough Obesity  Past Surgical History: Reviewed history from 12/16/2006 and no changes required. Colonoscopy Small Adenomatous polyp removal  Family History: Reviewed history from 12/16/2006 and no changes required. Family History of Cardiovascular disorder Family History Diabetes 1st degree relative Family History of Colon CA 1st degree relative <60  Social History: Reviewed history from 09/05/2007 and no changes required. Retired Single Patient never smoked.   Review of Systems  The patient denies anorexia, fever, weight loss, weight gain, vision loss, decreased hearing, hoarseness, chest pain, syncope, dyspnea on exertion, peripheral edema, prolonged cough, headaches, hemoptysis, abdominal pain, melena, hematochezia, severe  indigestion/heartburn, hematuria, incontinence, genital sores, muscle weakness, suspicious skin lesions, transient blindness, difficulty walking, depression, unusual weight change, abnormal bleeding, enlarged lymph nodes, angioedema, breast masses, and testicular masses.    Physical Exam  General:  healthy appearing.  well-developed.   Head:  normocephalic.   Eyes:  pupils equal and pupils round.   Ears:  R ear normal and L ear normal.   Nose:  nasal dischargemucosal pallor, mucosal edema, and airflow obstruction.   Mouth:  pharyngeal erythema and posterior lymphoid hypertrophy.   Neck:  No deformities, masses, or tenderness noted. Lungs:  Normal respiratory effort, chest expands symmetrically. Lungs are clear to auscultation, no crackles or wheezes. Heart:  Normal rate and regular rhythm. S1 and S2 normal without gallop, murmur, click, rub or other extra sounds. Abdomen:  Bowel sounds positive,abdomen soft and non-tender without masses, organomegaly or hernias noted. Extremities:  No clubbing, cyanosis, edema, or deformity noted with normal full range of motion of all joints.   Neurologic:  No cranial nerve deficits noted. Station and gait are normal. Plantar reflexes are down-going bilaterally. DTRs are symmetrical throughout. Sensory, motor and coordinative functions appear intact.   Impression & Recommendations:  Problem # 1:  EUSTACHIAN TUBE DYSFUNCTION, RIGHT (ICD-381.81) add nasonex  Problem # 2:  IRRITABLE BOWEL SYNDROME (ICD-564.1) stable pattern with diarrhea pattern primary  Problem # 3:  ANEMIA-NOS (ICD-285.9)  Her updated medication list for this problem includes:    Cyanocobalamin 1000 Mcg/ml Soln (Cyanocobalamin) ..... One cc im evey 3 weeks    D 1000 Plus Tabs (Fa-cyanocobalamin-b6-d-ca) .Marland Kitchen... 1 two times a day    Ferrous Sulfate 325 (65 Fe) Mg Tbec (Ferrous sulfate) ..... One by mouth three times a week  Hgb: 11.8 (10/04/2008)   Hct: 35.4 (10/04/2008)   Platelets:  262.0 (10/04/2008) RBC: 3.88 (10/04/2008)   RDW: 13.1 (10/04/2008)   WBC: 5.5 (10/04/2008) MCV: 91.2 (10/04/2008)   MCHC: 33.3 (10/04/2008) Iron: 91 (10/04/2008)   TSH: 2.95 (10/04/2008)  Problem # 4:  OSTEOARTHRITIS (ICD-715.90)  Discussed use of medications, application of heat or cold, and exercises.   Hypertension Assessment/Plan:      The patient's hypertensive  risk group is category C: Target organ damage and/or diabetes.  Today's blood pressure is 132/78.  Her blood pressure goal is < 130/80.  Patient Instructions: 1)  Please schedule a follow-up appointment in 3 months. Prescriptions: ZITHROMAX Z-PAK 250 MG TABS (AZITHROMYCIN) use as directed  #1 pk x 0   Entered and Authorized by:   Stacie Glaze MD   Signed by:   Stacie Glaze MD on 04/11/2009   Method used:   Electronically to        [facility]  [address] [id number]* (retail)       [address]       [place], [place]  [address]       Ph: [phone] or [phone]       Fax: [phone]   RxID:   [id number]    Immunization History:  Influenza Immunization History:    Influenza:  historical (04/03/2009)

## 2010-06-05 NOTE — Assessment & Plan Note (Signed)
Summary: 2 month roa/jls  Medications Added ZANTAC 150 MAXIMUM STRENGTH 150 MG  TABS (RANITIDINE HCL) OTC at the drug stor one a night for at least 2 weeks then as needed  when you feel bloated        Vital Signs:  Patient Profile:   77 Years Old Female Height:     68 inches BP sitting:   130 / 80                 History of Present Illness: Current Problems:   ACUTE CYSTITIS (ICD-595.0) FAMILY HISTORY OF COLON CA 1ST DEGREE RELATIVE <60 (ICD-V16.0) FAMILY HISTORY DIABETES 1ST DEGREE RELATIVE (ICD-V18.0) HYPERLIPIDEMIA (ICD-272.4) stable GERD (ICD-530.81) COLONIC POLYPS, HX OF (ICD-V12.72) ANEMIA-NOS (ICD-285.9)  recheck ALLERGIC RHINITIS (ICD-477.9) OSTEOARTHRITIS (ICD-715.90) HYPERTENSION (ICD-401.9)  intolerant of many many htn agents with refusal to try new medication DIABETES MELLITUS, TYPE II (ICD-250.00)  stable      Diabetes Management History:      The patient is a 77 years old female who comes in for evaluation of DM Type 2.  She has not been enrolled in the "Diabetic Education Program".  She states understanding of dietary principles and is following her diet appropriately.  No sensory loss is reported.  Self foot exams are being performed.  She is checking home blood sugars.  She says that she is not exercising regularly.        Her home fasting blood sugars are as follows: average: 110.    Hypertension History:      She denies headache, chest pain, palpitations, dyspnea with exertion, orthopnea, PND, peripheral edema, visual symptoms, neurologic problems, syncope, and side effects from treatment.        Positive major cardiovascular risk factors include female age 9 years old or older, diabetes, hyperlipidemia, and hypertension.        Current Allergies: ! * DUST ! * SMOKE ! * IVP DYE ! DURICEF ! * SULPHUR ! PENICILLIN ! TEGRETOL ! * CINEBAC ! * NICOBID ! Lanetta Inch ! * RU-TUSS ! * ACTONEL ! * ATACAND ! NORVASC ! * MOBIC ! * BEXTRA ! Johnathan Hausen ! * PREVACHOL ! HYDROCODONE ! RELAFEN ! * NIASPAN ! VICODIN ! * ZEPHREX LA ! ASPIRIN ! SELDANE ! CODEINE ! VIOXX ! NAPROSYN ! * ZETIA ! CELEBREX ! * LOETREL ! * MICARDIS ! [facility] ! Jolene Provost ! * SULAR ! MORPHINE ! * OMNICEF ! Lanetta Inch ! * OPTIVAR EYE DROPS ! * CLONODINE ! VERAPAMIL ! * SOLUMDEROL ! * POLLEN ! * MOLD  Past Medical History:    Reviewed history from 12/16/2006 and no changes required:       Allergies       Diabetes mellitus, type II       Hypertension       Osteoarthritis       Palpatations       Allergic rhinitis       Anemia-NOS       Colonic polyps, hx of       GERD       Hyperlipidemia       Chronic Facial Pain       Acral Numbness       Low Vit B12 Levels       Upper Arm Knots       Dylipidemia       Chronic Foot Pain       Chronic Dry Cough  Obesity  Past Surgical History:    Reviewed history from 12/16/2006 and no changes required:       Colonoscopy       Small Adenomatous polyp removal   Family History:    Reviewed history from 12/16/2006 and no changes required:       Family History of Cardiovascular disorder       Family History Diabetes 1st degree relative       Family History of Colon CA 1st degree relative <60  Social History:    Reviewed history from 12/16/2006 and no changes required:       Retired       Single    Review of Systems  The patient denies anorexia, fever, weight loss, weight gain, vision loss, decreased hearing, hoarseness, chest pain, syncope, dyspnea on exhertion, peripheral edema, prolonged cough, hemoptysis, abdominal pain, melena, hematochezia, severe indigestion/heartburn, hematuria, incontinence, genital sores, muscle weakness, suspicious skin lesions, transient blindness, difficulty walking, depression, unusual weight change, abnormal bleeding, enlarged lymph nodes, angioedema, and breast masses.     Physical Exam  General:     Well-developed,well-nourished,in no acute distress;  alert,appropriate and cooperative throughout examination Ears:     External ear exam shows no significant lesions or deformities.  Otoscopic examination reveals clear canals, tympanic membranes are intact bilaterally without bulging, retraction, inflammation or discharge. Hearing is grossly normal bilaterally. Nose:     no external deformity.   Mouth:     Oral mucosa and oropharynx without lesions or exudates.  Teeth in good repair. Chest Wall:     No deformities, masses, or tenderness noted. Lungs:     Normal respiratory effort, chest expands symmetrically. Lungs are clear to auscultation, no crackles or wheezes. Heart:     Normal rate and regular rhythm. S1 and S2 normal without gallop, murmur, click, rub or other extra sounds. Abdomen:     soft, non-tender, and bowel sounds hyperactive.   Msk:     normal ROM and no joint tenderness.      Impression & Recommendations:  Problem # 1:  HYPERTENSION (ICD-401.9)  Her updated medication list for this problem includes:    Tarka 2-180 Mg Tbcr (Trandolapril-verapamil hcl) ..... One by mouth daily  BP today: 130/80 Prior BP: 130/78 (05/03/2007)  Prior 10 Yr Risk Heart Disease: Not enough information (03/23/2007)  Labs Reviewed: Creat: 0.9 (05/03/2007)   Problem # 2:  DIABETES MELLITUS, TYPE II (ICD-250.00)  Her updated medication list for this problem includes:    Starlix 120 Mg Tabs (Nateglinide) .Marland Kitchen... Tid    Glucophage 500 Mg Tabs (Metformin hcl) .Marland Kitchen... Tid    Tarka 2-180 Mg Tbcr (Trandolapril-verapamil hcl) ..... One by mouth daily  Labs Reviewed: Creat: 0.9 (05/03/2007)      Problem # 3:  GERD (ICD-530.81) no taking any thing for ulcers Diagnostics Reviewed:  Discussed lifestyle modifications, diet, antacids/medications, and preventive measures. Handout provided.  Her updated medication list for this problem includes:    Zantac 150 Maximum Strength 150 Mg Tabs (Ranitidine hcl) ..... Otc at the drug stor one a night for  at least 2 weeks then as needed  when you feel bloated   Problem # 4:  ACUTE CYSTITIS (ICD-595.0) acute oin chronic Her updated medication list for this problem includes:    Cipro 500 Mg Tabs (Ciprofloxacin hcl) .Marland Kitchen... Take one (1) by mouth twice a day  Encouraged to push clear liquids, get enough rest, and take acetaminophen as needed. To be seen in 10 days if  no improvement, sooner if worse.  Orders: UA Dipstick w/Micro (automated) (81001)   Complete Medication List: 1)  Cyanocobalamin 1000 Mcg/ml Soln (Cyanocobalamin) .... One cc im every month 2)  Starlix 120 Mg Tabs (Nateglinide) .... Tid 3)  Glucophage 500 Mg Tabs (Metformin hcl) .... Tid 4)  Niacin Cr 500 Mg Tbcr (Niacin) .... Once daily 5)  Patanol 0.1 % Soln (Olopatadine hcl) .... As needed 6)  Red Wine Extract Plus Caps (Misc natural products) .... Once daily 7)  Alprazolam 0.25 Mg Tabs (Alprazolam) .... As needed 8)  Ultram Er 100 Mg Tb24 (Tramadol hcl) .... As needed\par 9)  Boniva 150 Mg Tabs (Ibandronate sodium) .... One by mouth monthy 10)  Astelin 137 Mcg/spray Soln (Azelastine hcl) .... Two sprays q nare bid 11)  Tarka 2-180 Mg Tbcr (Trandolapril-verapamil hcl) .... One by mouth daily 12)  Cobal-1000 1000 Mcg/ml Inj Soln (Cyanocobalamin) .Marland Kitchen.. 1cc im weekly 13)  Cipro 500 Mg Tabs (Ciprofloxacin hcl) .... Take one (1) by mouth twice a day 14)  Promethazine Hcl 25 Mg Tabs (Promethazine hcl) .... One by mouth q 4-6 hours 15)  Zantac 150 Maximum Strength 150 Mg Tabs (Ranitidine hcl) .... Otc at the drug stor one a night for at least 2 weeks then as needed  when you feel bloated  Diabetes Management Assessment/Plan:      The following lipid goals have been established for the patient: Total cholesterol goal of 200; LDL cholesterol goal of 100; HDL cholesterol goal of 40; Triglyceride goal of 200.  Her blood pressure goal is < 130/80.    Hypertension Assessment/Plan:      The patient's hypertensive risk group is category C:  Target organ damage and/or diabetes.  Today's blood pressure is 130/80.  Her blood pressure goal is < 130/80.   Patient Instructions: 1)  Please schedule a follow-up appointment in 3 months. 2)  If is good to titrate the tarka up and down 3)  as you need for the diarrhea and the hypertenson    ]  Appended Document: 2 month roa/jls

## 2010-06-05 NOTE — Progress Notes (Signed)
Summary: would like to talk to nurse   Phone Note Call from Patient Call back at Home Phone [phone]   Caller: Patient Call For: Arik Husmann Summary of Call: would like to talk to a nurse  Initial call taken by: Roselle Locus,  March 29, 2007 2:32 PM  Follow-up for Phone Call        See previous note.  Called patient. Follow-up by: Rudy Jew, RN,  March 29, 2007 2:34 PM

## 2010-06-05 NOTE — Letter (Signed)
Summary: [facility] Diabetes  [facility]   Imported By: Maryln Gottron 06/03/2009 15:06:44  _____________________________________________________________________  External Attachment:    Type:   Image     Comment:   External Document

## 2010-06-05 NOTE — Letter (Signed)
Summary:  [facility]ocr[facility] Diabetes  [facility]   Imported By: Maryln Gottron 04/03/2010 10:33:03  _____________________________________________________________________  External Attachment:    Type:   Image     Comment:   External Document

## 2010-06-05 NOTE — Assessment & Plan Note (Signed)
Summary: 3 MONTH ROV/NJR   Vital Signs:  Patient Profile:   77 Years Old Female Height:     68 inches Weight:      146 pounds Temp:     98.2 degrees F oral Pulse rate:   76 / minute Resp:     14 per minute BP sitting:   140 / 80  (left arm)  Vitals Entered By: Willy Eddy, LPN (December 21, 2007 9:13 AM)                 PCP:  Stacie Glaze MD  Chief Complaint:  roa.  History of Present Illness:  Hypertension Follow-Up      This is a 77 year old woman who presents for Hypertension follow-up.  The patient denies lightheadedness, urinary frequency, headaches, edema, impotence, rash, and fatigue.  The patient denies the following associated symptoms: chest pain, chest pressure, exercise intolerance, dyspnea, palpitations, syncope, leg edema, and pedal edema.  Compliance with medications (by patient report) has been near 100%.  The patient reports that dietary compliance has been excellent.  The patient reports exercising 3-4X per week.      Current Allergies: ! * DUST ! * SMOKE ! * IVP DYE ! DURICEF ! * SULPHUR ! PENICILLIN ! TEGRETOL ! * CINEBAC ! * NICOBID ! Lanetta Inch ! * RU-TUSS ! * ACTONEL ! * ATACAND ! NORVASC ! * MOBIC ! * BEXTRA ! Johnathan Hausen ! * PREVACHOL ! HYDROCODONE ! RELAFEN ! * NIASPAN ! VICODIN ! * ZEPHREX LA ! ASPIRIN ! SELDANE ! CODEINE ! VIOXX ! NAPROSYN ! * ZETIA ! CELEBREX ! * LOETREL ! * MICARDIS ! [facility] ! Jolene Provost ! * SULAR ! MORPHINE ! * OMNICEF ! Lanetta Inch ! * OPTIVAR EYE DROPS ! * CLONODINE ! VERAPAMIL ! * SOLUMDEROL ! * POLLEN ! * MOLD  Past Medical History:    Reviewed history from 12/16/2006 and no changes required:       Allergies       Diabetes mellitus, type II       Hypertension       Osteoarthritis       Palpatations       Allergic rhinitis       Anemia-NOS       Colonic polyps, hx of       GERD       Hyperlipidemia       Chronic Facial Pain       Acral Numbness       Low Vit B12 Levels  Upper Arm Knots       Dylipidemia       Chronic Foot Pain       Chronic Dry Cough       Obesity   Family History:    Reviewed history from 12/16/2006 and no changes required:       Family History of Cardiovascular disorder       Family History Diabetes 1st degree relative       Family History of Colon CA 1st degree relative <60  Social History:    Reviewed history from 09/05/2007 and no changes required:       Retired       Single       Patient never smoked.     Review of Systems  The patient denies anorexia, fever, weight loss, weight gain, vision loss, decreased hearing, hoarseness, chest pain, syncope, dyspnea on exertion, peripheral edema, prolonged cough,  headaches, hemoptysis, abdominal pain, melena, hematochezia, severe indigestion/heartburn, hematuria, incontinence, genital sores, muscle weakness, suspicious skin lesions, transient blindness, difficulty walking, depression, unusual weight change, abnormal bleeding, enlarged lymph nodes, angioedema, and breast masses.     Physical Exam  General:     healthy appearing.   Head:     normocephalic.   Eyes:     PERRLA/EOM intact; conjunctiva and sclera clear there is periorbital puffiness. Ears:     External ear exam shows no significant lesions or deformities.  Otoscopic examination reveals clear canals, tympanic membranes are intact bilaterally without bulging, retraction, inflammation or discharge. Hearing is grossly normal bilaterally. Nose:     I don't see any irritation or folliculitis in her nostrils.  There is some clear watery secretion. Mouth:     clear Neck:     no JVD.   Lungs:     clear bilaterally to auscultation and percussion Heart:     regular rate and rhythm, S1, S2 without murmurs, rubs, gallops, or clicks Abdomen:     soft, non-tender, and bowel sounds hyperactive.   Msk:     decreased ROM, joint tenderness, and joint swelling.   Pulses:     R and L carotid,radial,femoral,dorsalis pedis and  posterior tibial pulses are full and equal bilaterally Neurologic:     No cranial nerve deficits noted. Station and gait are normal. Plantar reflexes are down-going bilaterally. DTRs are symmetrical throughout. Sensory, motor and coordinative functions appear intact.    Impression & Recommendations:  Problem # 1:  HYPERLIPIDEMIA (ICD-272.4)  Prior 10 Yr Risk Heart Disease: Not enough information (03/23/2007)   Problem # 2:  GERD (ICD-530.81)  Her updated medication list for this problem includes:    Zantac 150 Maximum Strength 150 Mg Tabs (Ranitidine hcl) ..... Otc at the drug stor one a night for at least 2 weeks then as needed  when you feel bloated  Diagnostics Reviewed:  Discussed lifestyle modifications, diet, antacids/medications, and preventive measures. Handout provided.   Problem # 3:  HYPERTENSION (ICD-401.9)  Her updated medication list for this problem includes:    Tarka 2-180 Mg Tbcr (Trandolapril-verapamil hcl) .Marland Kitchen... 1/2 by mouth every other day y  BP today: 140/80 Prior BP: 140/80 (09/28/2007)  Prior 10 Yr Risk Heart Disease: Not enough information (03/23/2007)  Labs Reviewed: Creat: 0.9 (09/28/2007)   Problem # 4:  OSTEOARTHRITIS (ICD-715.90)  Her updated medication list for this problem includes:    Ultram Er 100 Mg Tb24 (Tramadol hcl) .Marland Kitchen... As needed\par Discussed use of medications, application of heat or cold, and exercises.   Complete Medication List: 1)  Cyanocobalamin 1000 Mcg/ml Soln (Cyanocobalamin) .... One cc im every month 2)  Starlix 120 Mg Tabs (Nateglinide) .... Tid 3)  Glucophage 500 Mg Tabs (Metformin hcl) .... Tid 4)  Co Q-10 Plus Red Yeast Rice 60-600 Mg Caps (Coenzyme q10-red yeast rice) .Marland Kitchen.. 1 once daily 5)  Patanol 0.1 % Soln (Olopatadine hcl) .... As needed 6)  Red Wine Extract Plus Caps (Misc natural products) .... Once daily 7)  Alprazolam 0.25 Mg Tabs (Alprazolam) .... As needed 8)  Ultram Er 100 Mg Tb24 (Tramadol hcl) .... As  needed\par 9)  Boniva 150 Mg Tabs (Ibandronate sodium) .... One by mouth monthy 10)  Astelin 137 Mcg/spray Soln (Azelastine hcl) .... Two sprays q nare bid 11)  Tarka 2-180 Mg Tbcr (Trandolapril-verapamil hcl) .... 1/2 by mouth every other day y 68)  Promethazine Hcl 25 Mg Tabs (Promethazine hcl) .... One by mouth  q 4-6 hours 13)  Zantac 150 Maximum Strength 150 Mg Tabs (Ranitidine hcl) .... Otc at the drug stor one a night for at least 2 weeks then as needed  when you feel bloated 14)  Solaraze 3 % Gel (Diclofenac sodium) .... Apply twicedaily to area 15)  Allegra 60 Mg Tabs (Fexofenadine hcl) .Marland Kitchen.. 1 two times a day as needed for allergy 16)  Benzonatate 100 Mg Caps (Benzonatate) .Marland Kitchen.. 1 three times a day as needed cough  Other Orders: UA Dipstick w/o Micro (automated)  (81003)   Patient Instructions: 1)  move the red rice yeast and all other night time suppliments to before dinner 2)  try the continue the fish oil... lovaza  space it out to see if you fell better 3)  TRIAL of immodium at bedtime ( mild form)  if you an find tablets even try one hald to start...   Prescriptions: SOLARAZE 3 %  GEL (DICLOFENAC SODIUM) apply twicedaily to area  #100gm x 3   Entered and Authorized by:   Stacie Glaze MD   Signed by:   Stacie Glaze MD on 12/21/2007   Method used:   Print then Give to Patient   RxID:   [id number] SOLARAZE 3 %  GEL (DICLOFENAC SODIUM)   #3 x 3   Entered by:   Willy Eddy, LPN   Authorized by:   Stacie Glaze MD   Signed by:   Willy Eddy, LPN on 14/78/2956   Method used:   Print then Give to Patient   RxID:   [id number] BONIVA 150 MG  TABS (IBANDRONATE SODIUM) one by mouth monthy  #3 x 3   Entered by:   Willy Eddy, LPN   Authorized by:   Stacie Glaze MD   Signed by:   Willy Eddy, LPN on 28/41/3244   Method used:   Print then Give to Patient   RxID:   [id number]  ]

## 2010-06-05 NOTE — Letter (Signed)
Summary: GOC note  GOC note   Imported By: Kassie Mends 02/24/2007 08:33:24  _____________________________________________________________________  External Attachment:    Type:   Image     Comment:   GOC note

## 2010-06-05 NOTE — Assessment & Plan Note (Signed)
Summary: FOLLOW UP/ MBW  Medications Added TARKA 2-180 MG  TBCR (TRANDOLAPRIL-VERAPAMIL HCL) 1/2 by mouth daily SOLARAZE 3 %  GEL (DICLOFENAC SODIUM)  ALLEGRA 60 MG  TABS (FEXOFENADINE HCL) 1 two times a day as needed for allergy BENZONATATE 100 MG  CAPS (BENZONATATE) 1 three times a day as needed cough        Visit Type:  Follow-up PCP:  Stacie Glaze MD  Chief Complaint:  follow up visit......Marland Kitchenreviewed meds.....  History of Present Illness: Current Problems:  ACUTE CYSTITIS (ICD-595.0) FAMILY HISTORY OF COLON CA 1ST DEGREE RELATIVE <60 (ICD-V16.0) FAMILY HISTORY DIABETES 1ST DEGREE RELATIVE (ICD-V18.0) HYPERLIPIDEMIA (ICD-272.4) GERD (ICD-530.81) COLONIC POLYPS, HX OF (ICD-V12.72) ANEMIA-NOS (ICD-285.9) ***ALLERGIC RHINITIS (ICD-477.9) OSTEOARTHRITIS (ICD-715.90) HYPERTENSION (ICD-401.9) DIABETES MELLITUS, TYPE II (ICD-250.00)  [name] returns for scheduled follow-up.  She says the pollen now is causing increased postnasal drip, which makes her nauseated.  She has been using Allegra D., but it raises her blood pressure.  She has used erythromycin ointment on a sore in her nose.  She likes Astelin nasal spray.  She had been on allergy vaccine for 10 years and quit.         Current Allergies (reviewed today): ! * DUST ! * SMOKE ! * IVP DYE ! DURICEF ! * SULPHUR ! PENICILLIN ! TEGRETOL ! * CINEBAC ! * NICOBID ! Lanetta Inch ! * RU-TUSS ! * ACTONEL ! * ATACAND ! NORVASC ! * MOBIC ! * BEXTRA ! Johnathan Hausen ! * PREVACHOL ! HYDROCODONE ! RELAFEN ! * NIASPAN ! VICODIN ! * ZEPHREX LA ! ASPIRIN ! SELDANE ! CODEINE ! VIOXX ! NAPROSYN ! * ZETIA ! CELEBREX ! * LOETREL ! * MICARDIS ! [facility] ! Jolene Provost ! * SULAR ! MORPHINE ! * OMNICEF ! Lanetta Inch ! * OPTIVAR EYE DROPS ! * CLONODINE ! VERAPAMIL ! * SOLUMDEROL ! * POLLEN ! * MOLD  Past Medical History:    Reviewed history from 12/16/2006 and no changes required:       Allergies       Diabetes  mellitus, type II       Hypertension       Osteoarthritis       Palpatations       Allergic rhinitis       Anemia-NOS       Colonic polyps, hx of       GERD       Hyperlipidemia       Chronic Facial Pain       Acral Numbness       Low Vit B12 Levels       Upper Arm Knots       Dylipidemia       Chronic Foot Pain       Chronic Dry Cough       Obesity  Past Surgical History:    Reviewed history from 12/16/2006 and no changes required:       Colonoscopy       Small Adenomatous polyp removal   Family History:    Reviewed history from 12/16/2006 and no changes required:       Family History of Cardiovascular disorder       Family History Diabetes 1st degree relative       Family History of Colon CA 1st degree relative <60  Social History:    Reviewed history from 12/16/2006 and no changes required:       Retired  Single       Patient never smoked.    Risk Factors:  Tobacco use:  never   Review of Systems      See HPI   Vital Signs:  Patient Profile:   77 Years Old Female Height:     68 inches Weight:      146 pounds BMI:     22.28 O2 Sat:      99 % O2 treatment:    Room Air Pulse rate:   65 / minute BP sitting:   120 / 58  (left arm) Cuff size:   large  Pt. in pain?   no  Vitals Entered By: Clarise Cruz Duncan Dull) (Prospero  4, 2009 10:36 AM)                  Physical Exam  General:     healthy appearing.   Eyes:     PERRLA/EOM intact; conjunctiva and sclera clear there is periorbital puffiness. Nose:     I don't see any irritation or folliculitis in her nostrils.  There is some clear watery secretion. Mouth:     clear Neck:     no JVD.   Lungs:     clear bilaterally to auscultation and percussion Heart:     regular rate and rhythm, S1, S2 without murmurs, rubs, gallops, or clicks Cervical Nodes:     no significant adenopathy Axillary Nodes:     no significant adenopathy     Impression & Recommendations:  Problem # 1:  ALLERGIC  RHINITIS (ICD-477.9) seasonal exacerbation of allergic rhinitis.  She has a tendency to feel intolerant to a great many medicines and exposures.  I'm not sure there is objective evidence for most of this.  She was skin test positive years ago and was on allergy vaccine as noted.  That supports the impression that she is symptomatic because of the pollen season.  Over the next few weeks that should improve steadily. Her updated medication list for this problem includes:    Astelin 137 Mcg/spray Soln (Azelastine hcl) .Marland Kitchen..Marland Kitchen Two sprays q nare bid    Promethazine Hcl 25 Mg Tabs (Promethazine hcl) ..... One by mouth q 4-6 hours    Allegra 60 Mg Tabs (Fexofenadine hcl) .Marland Kitchen... 1 two times a day as needed for allergy  Orders: Est. Patient Level III (16606)   Medications Added to Medication List This Visit: 1)  Tarka 2-180 Mg Tbcr (Trandolapril-verapamil hcl) .... 1/2 by mouth daily 2)  Solaraze 3 % Gel (Diclofenac sodium) 3)  Allegra 60 Mg Tabs (Fexofenadine hcl) .Marland Kitchen.. 1 two times a day as needed for allergy 4)  Benzonatate 100 Mg Caps (Benzonatate) .Marland Kitchen.. 1 three times a day as needed cough   Patient Instructions: 1)  Please schedule a follow-up appointment in 6 months. 2)  Try benzonatate perles for cough 3)  Try plain allegra/ fexofenadine  instead of  allegra-d for allergy without raising your BP    Prescriptions: BENZONATATE 100 MG  CAPS (BENZONATATE) 1 three times a day as needed cough  #30 x 1   Entered and Authorized by:   Waymon Budge MD   Signed by:   Waymon Budge MD on 09/05/2007   Method used:   Print then Give to Patient   RxID:   [id number] ALLEGRA 60 MG  TABS (FEXOFENADINE HCL) 1 two times a day as needed for allergy  #180 x 3   Entered and Authorized by:   Waymon Budge  MD   Signed by:   Waymon Budge MD on 09/05/2007   Method used:   Print then Give to Patient   RxID:   [id number]  ]

## 2010-06-05 NOTE — Progress Notes (Signed)
Summary: change Metformin  LMTCB 09/16/2009  Phone Note Call from Patient   Reason for Call: Acute Illness Summary of Call: left message on machine pt is c/o blood sugars being elevated - 270 this am. what does dr Lovell Sheehan suggest? Initial call taken by: Willy Eddy, LPN,  Coach 16, 2011 11:41 AM  Follow-up for Phone Call        change metofrmin to 500 2 two times a day per dr Lovell Sheehan Follow-up by: Willy Eddy, LPN,  Deiss 16, 2011 3:16 PM  Additional Follow-up for Phone Call Additional follow up Details #1::        LMTCB  pt notified. Lynann Beaver CMA  Torrence 17, 2011 9:43 AM   Additional Follow-up by: Lynann Beaver CMA,  Anwar 16, 2011 3:24 PM    New/Updated Medications: GLUCOPHAGE 500 MG  TABS (METFORMIN HCL) Take 2 by mouth bid

## 2010-06-05 NOTE — Progress Notes (Signed)
Summary: No,Can't take Cardura  Phone Note Call from Patient Call back at Home Phone [phone]   Caller: patient triage message Call For: Gavrielle Streck Summary of Call: Saw him last Wednesday  he put her on new blood pressure medicine  it is not working  what should she do 105 107 pulse  Initial call taken by: Roselle Locus,  March 28, 2007 2:10 PM  Follow-up for Phone Call        Pt. states she was seen last week and Dr. Lovell Sheehan changed her BP meds and since then......Marland Kitchenher pulse rate has increased to 105 at the highest...Marland KitchenMarland KitchenHer BP has only been as high as 162/89, and that was after she went back on her Tarca?  I do not see an office visit on this pt from last week. Follow-up by: Lynann Beaver CMA,  March 28, 2007 3:58 PM  Additional Follow-up for Phone Call Additional follow up Details #1::        Pt was placed on cardura 4 mg it will take time to  work... give it another week if possible the pother chart is under LBF code she has two charts please have them merge her. Additional Follow-up by: Stacie Glaze MD,  March 28, 2007 5:44 PM    Additional Follow-up for Phone Call Additional follow up Details #2::    LMTCB ..................................................................Marland KitchenRaelene Bott Spell, RN  March 29, 2007 8:44 AM Says she can't take the Cardura because it made her feel really bad, arms were weak, P107, felt heart beating.  Felt she'd just have to lay down and die.  Went back on Tarka x 3days. 152/87 P93 & 140/86 P77.  No diarrhea yet.  Follow-up by: Rudy Jew, RN,  March 29, 2007 2:43 PM  Additional Follow-up for Phone Call Additional follow up Details #3:: Details for Additional Follow-up Action Taken: has to use tarka Patient aware. ..................................................................Marland KitchenRudy Jew, RN  March 29, 2007 5:03 PM  Additional Follow-up by: Stacie Glaze MD,  March 29, 2007 3:29 PM

## 2010-06-05 NOTE — Assessment & Plan Note (Signed)
Summary: 2 month fup//ccm--rm 10   Vital Signs:  Patient profile:   77 year old female Height:      68 inches Weight:      151 pounds BMI:     23.04 Temp:     98.3 degrees F oral Pulse rate:   66 / minute Pulse rhythm:   regular Resp:     12 per minute BP sitting:   124 / 70  (left arm) Cuff size:   regular  Vitals Entered By: Mervin Kung CMA Duncan Dull) (May 15, 2010 10:58 AM) CC: 2 month follow up. Pt states she has been having increase gas and reflux since starting Janumet. Is Patient Diabetic? Yes Comments Pt agrees all med doses and directions are correct. Nicki Guadalajara Fergerson CMA Duncan Dull)  May 15, 2010 11:11 AM      Primary Care Provider:  Stacie Glaze MD  CC:  2 month follow up. Pt states she has been having increase gas and reflux since starting Janumet.Marland Kitchen  History of Present Illness: The pt has been switched to janumet and the blood glucoses are better she note hiccups and attributes them to the new medications She has a hx  multiple medication side effects and intolerances The pt has noted some increased in GERD with a hx of HH The endocrine consult was reviewed with the pt  Preventive Screening-Counseling & Management  Alcohol-Tobacco     Smoking Status: never     Tobacco Counseling: not indicated; no tobacco use  Problems Prior to Update: 1)  Osteoporosis  (ICD-733.00) 2)  Tension Type Headache Unspecified  (ICD-339.10) 3)  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) 4)  Asthma  (ICD-493.90) 5)  Acute Maxillary Sinusitis  (ICD-461.0) 6)  Eustachian Tube Dysfunction, Right  (ICD-381.81) 7)  Irritable Bowel Syndrome  (ICD-564.1) 8)  Cough  (ICD-786.2) 9)  Family History of Colon Ca 1st Degree Relative <60  (ICD-V16.0) 10)  Family History Diabetes 1st Degree Relative  (ICD-V18.0) 11)  Hyperlipidemia  (ICD-272.4) 12)  Gerd  (ICD-530.81) 13)  Colonic Polyps, Hx of  (ICD-V12.72) 14)  Anemia-nos  (ICD-285.9) 15)  Allergic Rhinitis  (ICD-477.9) 16)  Osteoarthritis   (ICD-715.90) 17)  Hypertension  (ICD-401.9) 18)  Diabetes Mellitus, Type II  (ICD-250.00) 19)  Symptom, Swelling in Head/neck  (ICD-784.2) 20)  Hyperlipidemia  (ICD-272.4) 21)  Headache  (ICD-784.0) 22)  Diabetes Mellitus, Type II  (ICD-250.00) 23)  Vasomotor Rhinitis  (ICD-477.9) 24)  Dm  (ICD-250.00) 25)  Osteoarthritis  (ICD-715.90) 26)  Hypertension  (ICD-401.9) 27)  Allergic Rhinitis  (ICD-477.9)  Medications Prior to Update: 1)  Starlix 120 Mg  Tabs (Nateglinide) .... Take 1 Tablet By Mouth Three Times A Day 2)  Janumet 50-1000 Mg Tabs (Sitagliptin-Metformin Hcl) .... One By Mouth Two Times A Day 3)  Calcium 600/vitamin D   Tabs (Calcium Carbonate-Vitamin D Tabs) .... Take 1 Tablet By Mouth Two Times A Day 4)  Red Yeast Rice Extract   Caps (Red Yeast Rice Extract Caps) .... Take 1 Tablet By Mouth Once A Day 5)  Cyanocobalamin 1000 Mcg/ml Soln (Cyanocobalamin) .... One Cc Im Evey 3 Weeks 6)  Co Q-10 Plus Red Yeast Rice 60-600 Mg  Caps (Coenzyme Q10-Red Yeast Rice) .Marland Kitchen.. 1 Once Daily 7)  Red Wine Extract Plus   Caps (Misc Natural Products) .... Once Daily 8)  Alprazolam 0.25 Mg  Tabs (Alprazolam) .... As Needed 9)  Astepro 0.15 % Soln (Azelastine Hcl) .Marland Kitchen.. 1-2 Puffs Each Nostril Two Times A Day As Needed  10)  Tarka 2-180 Mg  Tbcr (Trandolapril-Verapamil Hcl) .Marland Kitchen.. 1 Once Daily 11)  Zantac 150 Maximum Strength 150 Mg  Tabs (Ranitidine Hcl) .... Otc At The Drug Stor One A Night For At Least 2 Weeks Then As Needed  When You Feel Bloated 12)  Allegra 60 Mg  Tabs (Fexofenadine Hcl) .Marland Kitchen.. 1 Two Times A Day As Needed For Allergy 13)  Qvar 80 Mcg/act Aers (Beclomethasone Dipropionate) .... 2 Puffs and Rinse, Twice Daily 14)  Furosemide 20 Mg Tabs (Furosemide) .Marland Kitchen.. 1 Once Daily As Needed 15)  D 1000 Plus  Tabs (Fa-Cyanocobalamin-B6-D-Ca) .Marland Kitchen.. 1 Two Times A Day 16)  Benzonatate 100 Mg Caps (Benzonatate) .... One By Mouth Q 6 Hours As Needed Cough 17)  Ferrous Sulfate 325 (65 Fe) Mg Tbec (Ferrous  Sulfate) .Marland Kitchen.. 1 Once Daily 18)  Colestid 5 Gm Gran (Colestipol Hcl) .... 5gm in Water Daily ( [place] Have Flavors If She Desires) in Am As Directed 19)  Promethazine Hcl 25 Mg Tabs (Promethazine Hcl) .Marland Kitchen.. 1 Every 4-6 Hours As Needed Nausea and Vomiting 20)  Hydroxyzine Hcl 25 Mg Tabs (Hydroxyzine Hcl) .Marland Kitchen.. 1 Three Times A Day As Needed Itching  Current Medications (verified): 1)  Starlix 120 Mg  Tabs (Nateglinide) .... Take 1 Tablet By Mouth Three Times A Day 2)  Janumet 50-1000 Mg Tabs (Sitagliptin-Metformin Hcl) .... One By Mouth Two Times A Day 3)  Calcium 600/vitamin D   Tabs (Calcium Carbonate-Vitamin D Tabs) .... Take 1 Tablet By Mouth Two Times A Day 4)  Red Yeast Rice Extract   Caps (Red Yeast Rice Extract Caps) .... Take 1 Tablet By Mouth Once A Day 5)  Cyanocobalamin 1000 Mcg/ml Soln (Cyanocobalamin) .... One Cc Im Evey 3 Weeks 6)  Co Q-10 Plus Red Yeast Rice 60-600 Mg  Caps (Coenzyme Q10-Red Yeast Rice) .Marland Kitchen.. 1 Once Daily 7)  Red Wine Extract Plus   Caps (Misc Natural Products) .... Once Daily 8)  Alprazolam 0.25 Mg  Tabs (Alprazolam) .... As Needed 9)  Astepro 0.15 % Soln (Azelastine Hcl) .Marland Kitchen.. 1-2 Puffs Each Nostril Two Times A Day As Needed 10)  Tarka 2-180 Mg  Tbcr (Trandolapril-Verapamil Hcl) .Marland Kitchen.. 1 Once Daily 11)  Zantac 150 Maximum Strength 150 Mg  Tabs (Ranitidine Hcl) .... Otc At The Drug Stor One A Night For At Least 2 Weeks Then As Needed  When You Feel Bloated 12)  Allegra 60 Mg  Tabs (Fexofenadine Hcl) .Marland Kitchen.. 1 Two Times A Day As Needed For Allergy 13)  Qvar 80 Mcg/act Aers (Beclomethasone Dipropionate) .... 2 Puffs and Rinse, Twice Daily 14)  Furosemide 20 Mg Tabs (Furosemide) .Marland Kitchen.. 1 Once Daily As Needed 15)  D 1000 Plus  Tabs (Fa-Cyanocobalamin-B6-D-Ca) .Marland Kitchen.. 1 Two Times A Day 16)  Benzonatate 100 Mg Caps (Benzonatate) .... One By Mouth Q 6 Hours As Needed Cough 17)  Ferrous Sulfate 325 (65 Fe) Mg Tbec (Ferrous Sulfate) .Marland Kitchen.. 1 Once Daily 18)  Colestid 5 Gm Gran (Colestipol  Hcl) .... 5gm in Water Daily ( Sheehan Have Flavors If She Desires) in Am As Directed 19)  Promethazine Hcl 25 Mg Tabs (Promethazine Hcl) .Marland Kitchen.. 1 Every 4-6 Hours As Needed Nausea and Vomiting 20)  Hydroxyzine Hcl 25 Mg Tabs (Hydroxyzine Hcl) .Marland Kitchen.. 1 Three Times A Day As Needed Itching  Allergies: 1)  ! * Ivp Dye 2)  ! Duricef 3)  ! Sulfa 4)  ! Penicillin 5)  ! Tegretol 6)  ! Questran 7)  ! Ru-Tuss 8)  !  Actonel 9)  ! Atacand 10)  ! Norvasc 11)  ! Mobic 12)  ! * Bextra 13)  ! Micardis 14)  ! Asa 15)  ! * Zephrex La 16)  ! Tarka 17)  ! Sular 18)  ! Morphine 19)  ! Omnicef 20)  ! Clonidine Hcl 21)  ! Catapres 22)  ! Verapamil Hcl 23)  ! Solu-Medrol 24)  ! * Dust 25)  ! * Smoke 26)  ! * Ivp Dye 27)  ! Duricef 28)  ! * Sulphur 29)  ! Penicillin 30)  ! Tegretol 31)  ! * Cinebac 32)  ! * Nicobid 33)  ! * Questran 34)  ! * Ru-Tuss 35)  ! * Actonel 36)  ! * Atacand 37)  ! Norvasc 38)  ! * Mobic 39)  ! * Bextra 40)  ! * Ketek 41)  ! * Prevachol 42)  ! Hydrocodone 43)  ! Relafen 44)  ! * Niaspan 45)  ! Vicodin 46)  ! * Zephrex La 47)  ! Aspirin 48)  ! Seldane 49)  ! Codeine 50)  ! Vioxx 51)  ! Naprosyn 52)  ! * Zetia 53)  ! Celebrex 54)  ! * Loetrel 55)  ! * Micardis 56)  ! * Ziac 57)  ! * Tarka 58)  ! * Sular 59)  ! Morphine 60)  ! * Omnicef 61)  ! * Questran 62)  ! * Optivar Eye Drops 63)  ! * Clonodine 64)  ! Verapamil 65)  ! * Solumderol 66)  ! * Pneumonia 67)  ! * Pollen 68)  ! * Mold  Past History:  Family History: Last updated: 12/16/2006 Family History of Cardiovascular disorder Family History Diabetes 1st degree relative Family History of Colon CA 1st degree relative <60  Social History: Last updated: 09/05/2007 Retired Single Patient never smoked.   Risk Factors: Exercise: no (03/23/2007)  Risk Factors: Smoking Status: never (05/15/2010)  Past medical, surgical, family and social histories (including risk factors) reviewed, and no  changes noted (except as noted below).  Past Medical History: Reviewed history from 12/16/2006 and no changes required. Allergies Diabetes mellitus, type II Hypertension Osteoarthritis Palpatations Allergic rhinitis Anemia-NOS Colonic polyps, hx of GERD Hyperlipidemia Chronic Facial Pain Acral Numbness Low Vit B12 Levels Upper Arm Knots Dylipidemia Chronic Foot Pain Chronic Dry Cough Obesity  Past Surgical History: Reviewed history from 12/16/2006 and no changes required. Colonoscopy Small Adenomatous polyp removal  Family History: Reviewed history from 12/16/2006 and no changes required. Family History of Cardiovascular disorder Family History Diabetes 1st degree relative Family History of Colon CA 1st degree relative <60  Social History: Reviewed history from 09/05/2007 and no changes required. Retired Single Patient never smoked.   Review of Systems       The patient complains of hoarseness and severe indigestion/heartburn.  The patient denies anorexia, fever, weight loss, weight gain, vision loss, decreased hearing, chest pain, syncope, dyspnea on exertion, peripheral edema, prolonged cough, headaches, hemoptysis, abdominal pain, melena, hematochezia, hematuria, incontinence, genital sores, muscle weakness, suspicious skin lesions, transient blindness, difficulty walking, depression, unusual weight change, abnormal bleeding, enlarged lymph nodes, angioedema, and breast masses.    Physical Exam  General:  healthy appearing.  well-developed.   Head:  normocephalic.   Eyes:  pupils equal and pupils round.   Ears:  R ear normal and L ear normal.   Nose:  no external deformity and no nasal discharge.   Mouth:  pharyngeal erythema and posterior lymphoid  hypertrophy.   Neck:  No deformities, masses, or tenderness noted. Lungs:  Normal respiratory effort, chest expands symmetrically. Lungs are clear to auscultation, no crackles or wheezes. Heart:  Normal rate and  regular rhythm. S1 and S2 normal without gallop, murmur, click, rub or other extra sounds. Abdomen:  Bowel sounds positive,abdomen soft and non-tender without masses, organomegaly or hernias noted. Msk:  no joint swelling and no redness over joints.   Extremities:  No clubbing, cyanosis, edema, or deformity noted with normal full range of motion of all joints.   Neurologic:  alert & oriented X3, decreased sensation to PP, and decreased sensation to LT.     Impression & Recommendations:  Problem # 1:  HYPERTENSION (ICD-401.9) Assessment Unchanged  Her updated medication list for this problem includes:    Tarka 2-180 Mg Tbcr (Trandolapril-verapamil hcl) .Marland Kitchen... 1 once daily    Furosemide 20 Mg Tabs (Furosemide) .Marland Kitchen... 1 once daily as needed  BP today: 124/70 Prior BP: 140/76 (03/06/2010)  Prior 10 Yr Risk Heart Disease: Not enough information (03/23/2007)  Labs Reviewed: K+: 4.5 (09/05/2009) Creat: : 0.7 (09/05/2009)   Chol: 126 (12/05/2009)   HDL: 48 (12/05/2009)     Problem # 2:  DIABETES MELLITUS, TYPE II (ICD-250.00) Assessment: Unchanged  Her updated medication list for this problem includes:    Starlix 120 Mg Tabs (Nateglinide) .Marland Kitchen... Take 1 tablet by mouth three times a day    Janumet 50-1000 Mg Tabs (Sitagliptin-metformin hcl) ..... One by mouth two times a day    Tarka 2-180 Mg Tbcr (Trandolapril-verapamil hcl) .Marland Kitchen... 1 once daily  Labs Reviewed: Creat: 0.7 (09/05/2009)     Last Eye Exam: normal (01/02/2010) Reviewed HgBA1c results: 7.6 (03/06/2010)  8.2 (12/05/2009)  Problem # 3:  HYPERLIPIDEMIA (ICD-272.4)  Her updated medication list for this problem includes:    Colestid 5 Gm Gran (Colestipol hcl) .Marland Kitchen... 5gm in water daily ( [place] have flavors if she desires) in am as directed  Lipid Goals: Chol Goal: 200 (05/17/2008)   HDL Goal: 40 (05/17/2008)   LDL Goal: 100 (05/17/2008)   TG Goal: 150 (05/17/2008)  Prior 10 Yr Risk Heart Disease: Not enough information  (03/23/2007)   HDL:48 (12/05/2009), 42.10 (09/05/2009)  Chol:126 (12/05/2009), 151 (09/05/2009)  Complete Medication List: 1)  Starlix 120 Mg Tabs (Nateglinide) .... Take 1 tablet by mouth three times a day 2)  Janumet 50-1000 Mg Tabs (Sitagliptin-metformin hcl) .... One by mouth two times a day 3)  Calcium 600/vitamin D Tabs (Calcium carbonate-vitamin d tabs) .... Take 1 tablet by mouth two times a day 4)  Red Yeast Rice Extract Caps (Red yeast rice extract caps) .... Take 1 tablet by mouth once a day 5)  Cyanocobalamin 1000 Mcg/ml Soln (Cyanocobalamin) .... One cc im evey 3 weeks 6)  Co Q-10 Plus Red Yeast Rice 60-600 Mg Caps (Coenzyme q10-red yeast rice) .Marland Kitchen.. 1 once daily 7)  Red Wine Extract Plus Caps (Misc natural products) .... Once daily 8)  Alprazolam 0.25 Mg Tabs (Alprazolam) .... As needed 9)  Astepro 0.15 % Soln (Azelastine hcl) .Marland Kitchen.. 1-2 puffs each nostril two times a day as needed 10)  Tarka 2-180 Mg Tbcr (Trandolapril-verapamil hcl) .Marland Kitchen.. 1 once daily 11)  Zantac 150 Maximum Strength 150 Mg Tabs (Ranitidine hcl) .... Otc at the drug stor one a night for at least 2 weeks then as needed  when you feel bloated 12)  Allegra 60 Mg Tabs (Fexofenadine hcl) .Marland Kitchen.. 1 two times a day as needed for  allergy 13)  Qvar 80 Mcg/act Aers (Beclomethasone dipropionate) .... 2 puffs and rinse, twice daily 14)  Furosemide 20 Mg Tabs (Furosemide) .Marland Kitchen.. 1 once daily as needed 15)  D 1000 Plus Tabs (Fa-cyanocobalamin-b6-d-ca) .Marland Kitchen.. 1 two times a day 16)  Benzonatate 100 Mg Caps (Benzonatate) .... One by mouth q 6 hours as needed cough 17)  Ferrous Sulfate 325 (65 Fe) Mg Tbec (Ferrous sulfate) .Marland Kitchen.. 1 once daily 18)  Colestid 5 Gm Gran (Colestipol hcl) .... 5gm in water daily ( Gutierrez have flavors if she desires) in am as directed 19)  Promethazine Hcl 25 Mg Tabs (Promethazine hcl) .Marland Kitchen.. 1 every 4-6 hours as needed nausea and vomiting 20)  Hydroxyzine Hcl 25 Mg Tabs (Hydroxyzine hcl) .Marland Kitchen.. 1 three times a day as  needed itching 21)  Mupirocin 2 % Oint (Mupirocin) .... Apply to spot two times a day  Patient Instructions: 1)  Please schedule a follow-up appointment in 3 months. Prescriptions: MUPIROCIN 2 % OINT (MUPIROCIN) apply to spot two times a day  #15 x 0   Entered and Authorized by:   Stacie Glaze MD   Signed by:   Stacie Glaze MD on 05/15/2010   Method used:   Electronically to        [facility]  [address] [id number]* (retail)       [address]       [place], [place]  [address]       Ph: [phone] or [phone]       Fax: [phone]   RxID:   [id number]    Orders Added: 1)  Est. Patient Level IV [27253]    Current Allergies (reviewed today): ! * IVP DYE ! DURICEF ! SULFA ! PENICILLIN ! TEGRETOL ! QUESTRAN ! RU-TUSS ! ACTONEL ! ATACAND ! NORVASC ! MOBIC ! * BEXTRA ! MICARDIS ! ASA ! * ZEPHREX LA ! TARKA ! SULAR ! MORPHINE ! OMNICEF ! CLONIDINE HCL ! CATAPRES ! VERAPAMIL HCL ! SOLU-MEDROL ! * DUST ! * SMOKE ! * IVP DYE ! DURICEF ! * SULPHUR ! PENICILLIN ! TEGRETOL ! * CINEBAC ! * NICOBID ! Lanetta Inch ! * RU-TUSS ! * ACTONEL ! * ATACAND ! NORVASC ! * MOBIC ! * BEXTRA ! Johnathan Hausen ! * PREVACHOL ! HYDROCODONE ! RELAFEN ! * NIASPAN ! VICODIN ! * ZEPHREX LA ! ASPIRIN ! SELDANE ! CODEINE ! VIOXX ! NAPROSYN ! * ZETIA ! CELEBREX ! * LOETREL ! * MICARDIS ! [facility] ! Jolene Provost ! * SULAR ! MORPHINE ! * OMNICEF ! Lanetta Inch ! * OPTIVAR EYE DROPS ! * CLONODINE ! VERAPAMIL ! * SOLUMDEROL ! * PNEUMONIA ! * POLLEN ! * MOLD

## 2010-06-05 NOTE — Consult Note (Signed)
Summary: [facility] Endocrinology & Diabetes  [facility] Endocrinology & Diabetes   Imported By: Maryln Gottron 08/03/2007 15:48:58  _____________________________________________________________________  External Attachment:    Type:   Image     Comment:   External Document

## 2010-06-05 NOTE — Progress Notes (Signed)
Summary: Referral request  Phone Note Call from Patient Call back at Home Phone [phone]   Caller: Patient Call For: [name] Summary of Call: need a referral to see Dr. Ermalinda Barrios [phone]. appt for what is growing at throat. please call back Initial call taken by: Calvert Cantor,  December 01, 2006 12:37 PM  Follow-up for Phone Call        Pt reports the growth in neck/throat did not show anything on x-ray, however pt states it is effecting her voice at times, family and pt concerned.  Requesting referral to Dr. Dorma Russell. Follow-up by: Sid Falcon LPN,  December 01, 2006 12:44 PM  Additional Follow-up for Phone Call Additional follow up Details #1::        we will try to send to Dr Dorma Russell but he [place] not take medicare so npt Holben have to see Narda Bonds.  PC made to pt, explained Dr. Lovell Sheehan answer.  Pt reports she is on Medicare and was a pt of Dr. Dorma Russell approx. 10 years ago.   Pt requesting referral. Additional Follow-up by: Sid Falcon LPN,  December 01, 2006 2:02 PM  New Problems: SYMPTOM, SWELLING IN HEAD/NECK (ICD-784.2)   Additional Follow-up for Phone Call Additional follow up Details #2::    Dr. Dorma Russell is not seeing any new patients. Patient is aware. Appt with Dr.Newman 08/07 @ 3:30/pt aware Follow-up by: Florentina Addison,  December 02, 2006 3:40 PM  New Problems: SYMPTOM, SWELLING IN HEAD/NECK (ICD-784.2)

## 2010-06-05 NOTE — Progress Notes (Signed)
Summary: Refill  Phone Note Call from Patient Call back at Home Phone [phone]   Caller: Patient Call For: Stacie Glaze MD Reason for Call: Acute Illness Summary of Call: Patient needs her b12 shot cyanocobalamin 1000mg  refilled.  Please call it in to [facility] or call it in to somewhere local.  Patient wants big bottles called in Initial call taken by: Barnie Mort,  July 13, 2008 11:10 AM  Follow-up for Phone Call        pt informed Follow-up by: Willy Eddy, LPN,  July 13, 2008 11:15 AM      Prescriptions: CYANOCOBALAMIN 1000 MCG/ML SOLN (CYANOCOBALAMIN) one cc IM every month  #77ml vial x 3   Entered by:   Willy Eddy, LPN   Authorized by:   Stacie Glaze MD   Signed by:   Willy Eddy, LPN on 14/78/2956   Method used:   Faxed to ...       [facility] (mail-order)       [address]       [place], [place]  [address]       Ph: [phone]       Fax: [phone]   RxID:   [id number] CYANOCOBALAMIN 1000 MCG/ML SOLN (CYANOCOBALAMIN) one cc IM every month  #42ml vial x 3   Entered by:   Willy Eddy, LPN   Authorized by:   Stacie Glaze MD   Signed by:   Willy Eddy, LPN on 25/95/6387   Method used:   Print then Give to Patient   RxID:   [id number]

## 2010-06-05 NOTE — Progress Notes (Signed)
Summary: phone number busy,change to generic  Phone Note Call from Patient Call back at Home Phone [phone]   Caller: Patient Call For: dr jj Summary of Call: pt was given rx for cobal pt is requesting generic cyanocobalamin doc jj wrote on 05-03-07 . [facility] [phone] pt id  #[id number] 90 days with 3 refill for mailorder. Initial call taken by: Heron Sabins,  May 19, 2007 11:47 AM  Follow-up for Phone Call        call mail order and [facility] substitue generic Follow-up by: Stacie Glaze MD,  May 19, 2007 5:20 PM  Additional Follow-up for Phone Call Additional follow up Details #1::        Tried to call [facility], number busy, busy ..................................................................Marland KitchenSid Falcon LPN  May 19, 2007 5:39 PM Left message at [facility] and patient aware. Additional Follow-up by: Rudy Jew, RN,  May 20, 2007 8:30 AM

## 2010-06-05 NOTE — Assessment & Plan Note (Signed)
Summary: 3 month rov/njr   Vital Signs:  Patient profile:   77 year old female Height:      68 inches Weight:      148 pounds BMI:     22.58 Temp:     98.2 degrees F rectal Pulse rate:   76 / minute Resp:     14 per minute BP sitting:   130 / 80  (left arm)  Vitals Entered By: Willy Eddy, LPN (August 16, 2008 9:46 AM)  Primary Care Provider:  Stacie Glaze MD  CC:  roa, Type 2 diabetes mellitus follow-up, and URI symptoms.  History of Present Illness: URI symptoms anemia  probably  iron deficiency DM poorly controlled review labs from te outside source  Type 2 Diabetes Mellitus Follow-Up      This is a 77 year old woman who presents for Type 2 diabetes mellitus follow-up.  The patient denies polyuria, polydipsia, blurred vision, self managed hypoglycemia, hypoglycemia requiring help, weight loss, weight gain, and numbness of extremities.  The patient denies the following symptoms: poor wound healing.  Since the last visit the patient reports poor dietary compliance.  The patient has been measuring capillary blood glucose before breakfast.  Since the last visit, the patient reports having had eye care by a retinal specialist.    URI Symptoms      The patient also presents with URI symptoms.  The patient reports nasal congestion, purulent nasal discharge, sore throat, and dry cough.  The patient denies fever, low-grade fever (<100.5 degrees), fever of 100.5-103 degrees, fever of 103.1-104 degrees, fever to >104 degrees, stiff neck, dyspnea, wheezing, rash, vomiting, diarrhea, use of an antipyretic, and response to antipyretic.  The patient also reports itchy throat and severe fatigue.  The patient denies the following risk factors for Strep sinusitis: unilateral facial pain, unilateral nasal discharge, poor response to decongestant, double sickening, tooth pain, Strep exposure, tender adenopathy, and absence of cough.    Current Medications (verified): 1)  Cyanocobalamin 1000  Mcg/ml Soln (Cyanocobalamin) .... One Cc Im Evey 3 Weeks 2)  Starlix 120 Mg  Tabs (Nateglinide) .... Tid 3)  Glucophage 500 Mg  Tabs (Metformin Hcl) .... Tid 4)  Co Q-10 Plus Red Yeast Rice 60-600 Mg  Caps (Coenzyme Q10-Red Yeast Rice) .Marland Kitchen.. 1 Once Daily 5)  Red Wine Extract Plus   Caps (Misc Natural Products) .... Once Daily 6)  Alprazolam 0.25 Mg  Tabs (Alprazolam) .... As Needed 7)  Boniva 150 Mg  Tabs (Ibandronate Sodium) .... One By Mouth Monthy 8)  Astelin 137 Mcg/spray  Soln (Azelastine Hcl) .... Two Sprays Q Nare Bid 9)  Tarka 2-180 Mg  Tbcr (Trandolapril-Verapamil Hcl) .Marland Kitchen.. 1 Once Daily 10)  Zantac 150 Maximum Strength 150 Mg  Tabs (Ranitidine Hcl) .... Otc At The Drug Stor One A Night For At Least 2 Weeks Then As Needed  When You Feel Bloated 11)  Solaraze 3 %  Gel (Diclofenac Sodium) .... Apply Twicedaily To Area 12)  Allegra 60 Mg  Tabs (Fexofenadine Hcl) .Marland Kitchen.. 1 Two Times A Day As Needed For Allergy 13)  Qvar 80 Mcg/act Aers (Beclomethasone Dipropionate) .... 2 Puffs and Rinse, Twice Daily 14)  Furosemide 20 Mg Tabs (Furosemide) .Marland Kitchen.. 1 Once Daily As Needed 15)  Lovaza 1 Gm Caps (Omega-3-Acid Ethyl Esters) .... 2 Two Times A Day 16)  Calcium 600/vitamin D 600-400 Mg-Unit Chew (Calcium Carbonate-Vitamin D) .Marland Kitchen.. 1 Two Times A Day 17)  D 1000 Plus  Tabs (Fa-Cyanocobalamin-B6-D-Ca) .Marland KitchenMarland KitchenMarland Kitchen  1 Two Times A Day 18)  K-Tabs 10 Meq Cr-Tabs (Potassium Chloride) .Marland Kitchen.. 1 Once Daily 19)  Astepro 137 Mcg/spray Soln (Azelastine Hcl) .... Use As Directed  Allergies (verified): 1)  ! * Dust 2)  ! * Smoke 3)  ! * Ivp Dye 4)  ! Duricef 5)  ! * Sulphur 6)  ! Penicillin 7)  ! Tegretol 8)  ! * Cinebac 9)  ! * Nicobid 10)  ! * Questran 11)  ! * Ru-Tuss 12)  ! * Actonel 13)  ! * Atacand 14)  ! Norvasc 15)  ! * Mobic 16)  ! * Bextra 17)  ! * Ketek 18)  ! * Prevachol 19)  ! Hydrocodone 20)  ! Relafen 21)  ! * Niaspan 22)  ! Vicodin 23)  ! * Zephrex La 24)  ! Aspirin 25)  ! Seldane 26)  !  Codeine 27)  ! Vioxx 28)  ! Naprosyn 29)  ! * Zetia 30)  ! Celebrex 31)  ! * Loetrel 32)  ! * Micardis 33)  ! * Ziac 34)  ! * Tarka 35)  ! * Sular 36)  ! Morphine 37)  ! * Omnicef 38)  ! * Questran 39)  ! * Optivar Eye Drops 40)  ! * Clonodine 41)  ! Verapamil 42)  ! * Solumderol 43)  ! * Pollen 44)  ! * Mold  Past History:  Family History:    Family History of Cardiovascular disorder    Family History Diabetes 1st degree relative    Family History of Colon CA 1st degree relative <60     (12/16/2006)  Social History:    Retired    Single    Patient never smoked.      (09/05/2007)  Risk Factors:    Alcohol Use: N/A    >5 drinks/d w/in last 3 months: N/A    Caffeine Use: N/A    Diet: N/A    Exercise: no (03/23/2007)  Risk Factors:    Smoking Status: never (09/05/2007)    Packs/Day: N/A    Cigars/wk: N/A    Pipe Use/wk: N/A    Cans of tobacco/wk: N/A    Passive Smoke Exposure: N/A  Past medical, surgical, family and social histories (including risk factors) reviewed, and no changes noted (except as noted below).  Past Medical History:    Reviewed history from 12/16/2006 and no changes required:    Allergies    Diabetes mellitus, type II    Hypertension    Osteoarthritis    Palpatations    Allergic rhinitis    Anemia-NOS    Colonic polyps, hx of    GERD    Hyperlipidemia    Chronic Facial Pain    Acral Numbness    Low Vit B12 Levels    Upper Arm Knots    Dylipidemia    Chronic Foot Pain    Chronic Dry Cough    Obesity  Past Surgical History:    Reviewed history from 12/16/2006 and no changes required:    Colonoscopy    Small Adenomatous polyp removal  Family History:    Reviewed history from 12/16/2006 and no changes required:       Family History of Cardiovascular disorder       Family History Diabetes 1st degree relative       Family History of Colon CA 1st degree relative <60  Social History:    Reviewed history from 09/05/2007 and no  changes required:  Retired       Single       Patient never smoked.   Review of Systems       The patient complains of decreased hearing, hoarseness, prolonged cough, and headaches.  The patient denies anorexia, fever, weight loss, weight gain, vision loss, chest pain, syncope, dyspnea on exertion, peripheral edema, hemoptysis, abdominal pain, melena, hematochezia, severe indigestion/heartburn, hematuria, incontinence, genital sores, muscle weakness, suspicious skin lesions, transient blindness, difficulty walking, depression, unusual weight change, abnormal bleeding, enlarged lymph nodes, angioedema, and breast masses.    Physical Exam  General:  healthy appearing.  well-developed.   Head:  normocephalic.   Eyes:  No corneal or conjunctival inflammation noted. EOMI. Perrla. Funduscopic exam benign, without hemorrhages, exudates or papilledema. Vision grossly normal. Ears:  External ear exam shows no significant lesions or deformities.  Otoscopic examination reveals clear canals, tympanic membranes are intact bilaterally without bulging, retraction, inflammation or discharge. Hearing is grossly normal bilaterally. Mouth:  pharyngeal erythema and posterior lymphoid hypertrophy.   Neck:  No deformities, masses, or tenderness noted. Lungs:  Normal respiratory effort, chest expands symmetrically. Lungs are clear to auscultation, no crackles or wheezes. Heart:  Normal rate and regular rhythm. S1 and S2 normal without gallop, murmur, click, rub or other extra sounds. Abdomen:  Bowel sounds positive,abdomen soft and non-tender without masses, organomegaly or hernias noted. Msk:  decreased ROM, joint tenderness, and joint swelling.   Neurologic:  No cranial nerve deficits noted. Station and gait are normal. Plantar reflexes are down-going bilaterally. DTRs are symmetrical throughout. Sensory, motor and coordinative functions appear intact.   Impression & Recommendations:  Problem # 1:  ACUTE  MAXILLARY SINUSITIS (ICD-461.0)  Her updated medication list for this problem includes:    Astelin 137 Mcg/spray Soln (Azelastine hcl) .Marland Kitchen..Marland Kitchen Two sprays q nare bid    Astepro 137 Mcg/spray Soln (Azelastine hcl) ..... Use as directed    Azithromycin 250 Mg Tabs (Azithromycin) .Marland Kitchen..Marland Kitchen Two by mouth now, then one daily for 4 days    Benzonatate 100 Mg Caps (Benzonatate) ..... One by mouth q 6 hours as needed cough  Complete Medication List: 1)  Cyanocobalamin 1000 Mcg/ml Soln (Cyanocobalamin) .... One cc im evey 3 weeks 2)  Starlix 120 Mg Tabs (Nateglinide) .... Tid 3)  Glucophage 500 Mg Tabs (Metformin hcl) .... Tid 4)  Co Q-10 Plus Red Yeast Rice 60-600 Mg Caps (Coenzyme q10-red yeast rice) .Marland Kitchen.. 1 once daily 5)  Red Wine Extract Plus Caps (Misc natural products) .... Once daily 6)  Alprazolam 0.25 Mg Tabs (Alprazolam) .... As needed 7)  Boniva 150 Mg Tabs (Ibandronate sodium) .... One by mouth monthy 8)  Astelin 137 Mcg/spray Soln (Azelastine hcl) .... Two sprays q nare bid 9)  Tarka 2-180 Mg Tbcr (Trandolapril-verapamil hcl) .Marland Kitchen.. 1 once daily 10)  Zantac 150 Maximum Strength 150 Mg Tabs (Ranitidine hcl) .... Otc at the drug stor one a night for at least 2 weeks then as needed  when you feel bloated 11)  Solaraze 3 % Gel (Diclofenac sodium) .... Apply twicedaily to area 12)  Allegra 60 Mg Tabs (Fexofenadine hcl) .Marland Kitchen.. 1 two times a day as needed for allergy 13)  Qvar 80 Mcg/act Aers (Beclomethasone dipropionate) .... 2 puffs and rinse, twice daily 14)  Furosemide 20 Mg Tabs (Furosemide) .Marland Kitchen.. 1 once daily as needed 15)  Lovaza 1 Gm Caps (Omega-3-acid ethyl esters) .... 2 two times a day 16)  Calcium 600/vitamin D 600-400 Mg-unit Chew (Calcium carbonate-vitamin d) .Marland KitchenMarland KitchenMarland Kitchen 1  two times a day 17)  D 1000 Plus Tabs (Fa-cyanocobalamin-b6-d-ca) .Marland Kitchen.. 1 two times a day 18)  K-tabs 10 Meq Cr-tabs (Potassium chloride) .Marland Kitchen.. 1 once daily 19)  Astepro 137 Mcg/spray Soln (Azelastine hcl) .... Use as directed 20)   Azithromycin 250 Mg Tabs (Azithromycin) .... Two by mouth now, then one daily for 4 days 21)  Benzonatate 100 Mg Caps (Benzonatate) .... One by mouth q 6 hours as needed cough 22)  Tandem 162-115.2 Mg Caps (Ferrous fum-iron polysacch) .... One by mouth daily   Patient Instructions: 1)  Please schedule a follow-up appointment in 2 months. 2)  TSH prior to visit, ICD-9: 272.4  3)  CBC w/ Diff prior to visit, ICD-9:280.9 4)  iron  280.9 Prescriptions: TANDEM 162-115.2 MG CAPS (FERROUS FUM-IRON POLYSACCH) one by mouth daily  #90 x 3   Entered and Authorized by:   Stacie Glaze MD   Signed by:   Stacie Glaze MD on 08/16/2008   Method used:   Faxed to ...       [facility] (mail-order)       [address]       [place], [place]  [address]       Ph: [phone]       Fax: [phone]   RxID:   [id number] BENZONATATE 100 MG CAPS (BENZONATATE) one by mouth q 6 hours as needed cough  #30 x 1   Entered and Authorized by:   Stacie Glaze MD   Signed by:   Stacie Glaze MD on 08/16/2008   Method used:   Electronically to        [facility] [address] (retail)       [address]       [place], [place]  [address]       Ph: [phone]       Fax: [phone]   RxID:   [id number] AZITHROMYCIN 250 MG TABS (AZITHROMYCIN) two by mouth now, then one daily for 4 days  #6 x 1   Entered and Authorized by:   Stacie Glaze MD   Signed by:   Stacie Glaze MD on 08/16/2008   Method used:   Electronically to        [facility] [address] (retail)       [address]       [place], [place]  [address]       Ph: [phone]       Fax: [phone]   RxID:   [id number] CYANOCOBALAMIN 1000 MCG/ML SOLN (CYANOCOBALAMIN) one cc IM evey 3 weeks  #10cc vial x 1   Entered by:   Willy Eddy, LPN   Authorized by:   Stacie Glaze MD   Signed by:   Willy Eddy, LPN on 01/75/1025   Method used:   Faxed to ...        [facility]       [address] [address]       [place], [place]  [address]       Ph:        Fax: [phone]   RxID:   [id number]

## 2010-06-05 NOTE — Letter (Signed)
Summary: Dr Altheimer records  Dr Altheimer records   Imported By: Kassie Mends 08/02/2007 09:05:49  _____________________________________________________________________  External Attachment:    Type:   Image     Comment:   Dr Altheimer records

## 2010-06-05 NOTE — Consult Note (Signed)
Summary: Dr Altheimer note  Dr Altheimer note   Imported By: Kassie Mends 02/10/2008 12:46:44  _____________________________________________________________________  External Attachment:    Type:   Image     Comment:   Dr Altheimer note

## 2010-06-05 NOTE — Consult Note (Signed)
Summary: [facility]  [facility]   Imported By: Maryln Gottron 11/19/2008 09:58:14  _____________________________________________________________________  External Attachment:    Type:   Image     Comment:   External Document

## 2010-06-05 NOTE — Assessment & Plan Note (Signed)
Summary: ROA/M[facility]  Medications Added TARKA 2-180 MG  TBCR (TRANDOLAPRIL-VERAPAMIL HCL) one by mouth daily COBAL-1000 1000 MCG/ML INJ SOLN (CYANOCOBALAMIN) 1cc IM weekly CIPRO 500 MG TABS (CIPROFLOXACIN HCL) Take one (1) by mouth twice a day PROMETHAZINE HCL 25 MG  TABS (PROMETHAZINE HCL) one by mouth q 4-6 hours        Vital Signs:  Patient Profile:   76 Years Old Female Height:     68 inches Weight:      148 pounds Temp:     97.6 degrees F oral BP sitting:   130 / 78  (left arm)  Vitals Entered By: Raechel Ache, RN (May 03, 2007 1:17 PM)                 Chief Complaint:  ROV. Feels nauseous. Taking Tarks.Marland Kitchen  History of Present Illness: NAusia and dysuria for several days with possible passing of casts   Hypertension History:      She denies headache, chest pain, palpitations, dyspnea with exertion, orthopnea, PND, peripheral edema, visual symptoms, neurologic problems, syncope, and side effects from treatment.  She notes no problems with any antihypertensive medication side effects.  Further comments include: diarrhea.        Positive major cardiovascular risk factors include female age 51 years old or older, diabetes, hyperlipidemia, and hypertension.     Current Allergies (reviewed today): ! * DUST ! * SMOKE ! * IVP DYE ! DURICEF ! * SULPHUR ! PENICILLIN ! TEGRETOL ! * CINEBAC ! * NICOBID ! Lanetta Inch ! * RU-TUSS ! * ACTONEL ! * ATACAND ! NORVASC ! * MOBIC ! * BEXTRA ! Johnathan Hausen ! * PREVACHOL ! HYDROCODONE ! RELAFEN ! * NIASPAN ! VICODIN ! * ZEPHREX LA ! ASPIRIN ! SELDANE ! CODEINE ! VIOXX ! NAPROSYN ! * ZETIA ! CELEBREX ! * LOETREL ! * MICARDIS ! [facility] ! Jolene Provost ! * SULAR ! MORPHINE ! * OMNICEF ! Lanetta Inch ! * OPTIVAR EYE DROPS ! * CLONODINE ! VERAPAMIL ! * SOLUMDEROL ! * POLLEN ! * MOLD Updated/Current Medications (including changes made in today's visit):  CYANOCOBALAMIN 1000 MCG/ML SOLN (CYANOCOBALAMIN) EVERY  MONTH STARLIX 120 MG  TABS (NATEGLINIDE) TID GLUCOPHAGE 500 MG  TABS (METFORMIN HCL) TID NIACIN CR 500 MG  TBCR (NIACIN) once daily PATANOL 0.1 %  SOLN (OLOPATADINE HCL) as needed RED WINE EXTRACT PLUS   CAPS (MISC NATURAL PRODUCTS) once daily ALPRAZOLAM 0.25 MG  TABS (ALPRAZOLAM) as needed ULTRAM ER 100 MG  TB24 (TRAMADOL HCL) as needed\par BONIVA 150 MG  TABS (IBANDRONATE SODIUM) one by mouth monthy ASTELIN 137 MCG/SPRAY  SOLN (AZELASTINE HCL) two sprays q nare BID TARKA 2-180 MG  TBCR (TRANDOLAPRIL-VERAPAMIL HCL) one by mouth daily COBAL-1000 1000 MCG/ML INJ SOLN (CYANOCOBALAMIN) 1cc IM weekly CIPRO 500 MG TABS (CIPROFLOXACIN HCL) Take one (1) by mouth twice a day PROMETHAZINE HCL 25 MG  TABS (PROMETHAZINE HCL) one by mouth q 4-6 hours   Past Medical History:    Reviewed history from 12/16/2006 and no changes required:       Allergies       Diabetes mellitus, type II       Hypertension       Osteoarthritis       Palpatations       Allergic rhinitis       Anemia-NOS       Colonic polyps, hx of       GERD  Hyperlipidemia       Chronic Facial Pain       Acral Numbness       Low Vit B12 Levels       Upper Arm Knots       Dylipidemia       Chronic Foot Pain       Chronic Dry Cough       Obesity  Past Surgical History:    Reviewed history from 12/16/2006 and no changes required:       Colonoscopy       Small Adenomatous polyp removal   Family History:    Reviewed history from 12/16/2006 and no changes required:       Family History of Cardiovascular disorder       Family History Diabetes 1st degree relative       Family History of Colon CA 1st degree relative <60  Social History:    Reviewed history from 12/16/2006 and no changes required:       Retired       Single    Review of Systems  The patient denies anorexia, fever, weight loss, vision loss, decreased hearing, hoarseness, chest pain, syncope, dyspnea on exhertion, peripheral edema, prolonged cough,  hemoptysis, abdominal pain, melena, hematochezia, severe indigestion/heartburn, hematuria, genital sores, suspicious skin lesions, transient blindness, difficulty walking, depression, unusual weight change, abnormal bleeding, enlarged lymph nodes, angioedema, breast masses, and testicular masses.     Physical Exam  General:     Well-developed,well-nourished,in no acute distress; alert,appropriate and cooperative throughout examination Eyes:     No corneal or conjunctival inflammation noted. EOMI. Perrla. Funduscopic exam benign, without hemorrhages, exudates or papilledema. Vision grossly normal. Ears:     External ear exam shows no significant lesions or deformities.  Otoscopic examination reveals clear canals, tympanic membranes are intact bilaterally without bulging, retraction, inflammation or discharge. Hearing is grossly normal bilaterally. Mouth:     Oral mucosa and oropharynx without lesions or exudates.  Teeth in good repair. Neck:     No deformities, masses, or tenderness noted. Lungs:     Normal respiratory effort, chest expands symmetrically. Lungs are clear to auscultation, no crackles or wheezes. Heart:     Normal rate and regular rhythm. S1 and S2 normal without gallop, murmur, click, rub or other extra sounds.    Impression & Recommendations:  Problem # 1:  HYPERTENSION (ICD-401.9)  The following medications were removed from the medication list:    Lasix 20 Mg Tabs (Furosemide) ..... Once daily    Cardura 4 Mg Tab (Doxazosin mesylate) .Marland Kitchen... Take 1 tablet by mouth once a day at bedtime  Her updated medication list for this problem includes:    Tarka 2-180 Mg Tbcr (Trandolapril-verapamil hcl) ..... One by mouth daily  Orders: TLB-Renal Function Panel (80069-RENAL)   Problem # 2:  ANEMIA-NOS (ICD-285.9)  Her updated medication list for this problem includes:    Cyanocobalamin 1000 Mcg/ml Soln (Cyanocobalamin) ..... Every month    Cobal-1000 1000 Mcg/ml Inj Soln  (Cyanocobalamin) .Marland Kitchen... 1cc im weekly   Problem # 3:  DIABETES MELLITUS, TYPE II (ICD-250.00)  Her updated medication list for this problem includes:    Starlix 120 Mg Tabs (Nateglinide) .Marland Kitchen... Tid    Glucophage 500 Mg Tabs (Metformin hcl) .Marland Kitchen... Tid    Tarka 2-180 Mg Tbcr (Trandolapril-verapamil hcl) ..... One by mouth daily   Problem # 4:  ANEMIA-NOS (ICD-285.9)  Her updated medication list for this problem includes:    Cyanocobalamin 1000 Mcg/ml  Soln (Cyanocobalamin) ..... Every month    Cobal-1000 1000 Mcg/ml Inj Soln (Cyanocobalamin) .Marland Kitchen... 1cc im weekly   Problem # 5:  ACUTE CYSTITIS (ICD-595.0)  Her updated medication list for this problem includes:    Cipro 500 Mg Tabs (Ciprofloxacin hcl) .Marland Kitchen... Take one (1) by mouth twice a day  Encouraged to push clear liquids, get enough rest, and take acetaminophen as needed. To be seen in 10 days if no improvement, sooner if worse.   Complete Medication List: 1)  Cyanocobalamin 1000 Mcg/ml Soln (Cyanocobalamin) .... Every month 2)  Starlix 120 Mg Tabs (Nateglinide) .... Tid 3)  Glucophage 500 Mg Tabs (Metformin hcl) .... Tid 4)  Niacin Cr 500 Mg Tbcr (Niacin) .... Once daily 5)  Patanol 0.1 % Soln (Olopatadine hcl) .... As needed 6)  Red Wine Extract Plus Caps (Misc natural products) .... Once daily 7)  Alprazolam 0.25 Mg Tabs (Alprazolam) .... As needed 8)  Ultram Er 100 Mg Tb24 (Tramadol hcl) .... As needed\par 9)  Boniva 150 Mg Tabs (Ibandronate sodium) .... One by mouth monthy 10)  Astelin 137 Mcg/spray Soln (Azelastine hcl) .... Two sprays q nare bid 11)  Tarka 2-180 Mg Tbcr (Trandolapril-verapamil hcl) .... One by mouth daily 12)  Cobal-1000 1000 Mcg/ml Inj Soln (Cyanocobalamin) .Marland Kitchen.. 1cc im weekly 13)  Cipro 500 Mg Tabs (Ciprofloxacin hcl) .... Take one (1) by mouth twice a day 14)  Promethazine Hcl 25 Mg Tabs (Promethazine hcl) .... One by mouth q 4-6 hours  Hypertension Assessment/Plan:      The patient's hypertensive risk  group is category C: Target organ damage and/or diabetes.  Today's blood pressure is 130/78.  Her blood pressure goal is < 130/80.   Patient Instructions: 1)  Please schedule a follow-up appointment in 3 months.    Prescriptions: PROMETHAZINE HCL 25 MG  TABS (PROMETHAZINE HCL) one by mouth q 4-6 hours  #30 x 0   Entered and Authorized by:   Stacie Glaze MD   Signed by:   Stacie Glaze MD on 05/03/2007   Method used:   Print then Give to Patient   RxID:   [id number] CIPRO 500 MG TABS (CIPROFLOXACIN HCL) Take one (1) by mouth twice a day  #14 x 0   Entered and Authorized by:   Stacie Glaze MD   Signed by:   Stacie Glaze MD on 05/03/2007   Method used:   Print then Give to Patient   RxID:   [id number] COBAL-1000 1000 MCG/ML INJ SOLN (CYANOCOBALAMIN) 1cc IM weekly  #one vial x 3   Entered and Authorized by:   Stacie Glaze MD   Signed by:   Stacie Glaze MD on 05/03/2007   Method used:   Print then Give to Patient   RxID:   [id number] BONIVA 150 MG  TABS (IBANDRONATE SODIUM) one by mouth monthy  #3 x 3   Entered and Authorized by:   Stacie Glaze MD   Signed by:   Stacie Glaze MD on 05/03/2007   Method used:   Print then Give to Patient   RxID:   [id number] TARKA 2-180 MG  TBCR (TRANDOLAPRIL-VERAPAMIL HCL) one by mouth daily  #90 x 3   Entered and Authorized by:   Stacie Glaze MD   Signed by:   Stacie Glaze MD on 05/03/2007   Method used:   Print then Give to Patient   RxID:   [id number]  ]  Appended Document:  Orders Update     Clinical Lists Changes  Orders: Added new Service order of Venipuncture [phone]) - Signed

## 2010-06-05 NOTE — Assessment & Plan Note (Signed)
Summary: 3 month rov /njr   Vital Signs:  Patient profile:   77 year old female Height:      68 inches Weight:      150 pounds BMI:     22.89 Temp:     98.2 degrees F oral Pulse rate:   72 / minute Resp:     14 per minute BP sitting:   140 / 76  (left arm)  Vitals Entered By: Willy Eddy, LPN (March 06, 2010 9:28 AM) CC: roa Is Patient Diabetic? Yes Did you bring your meter with you today? No   Primary Care Provider:  Stacie Glaze MD  CC:  roa.  History of Present Illness: weigth and blood pressure are stable IBS is variable but is more consistant ( less diarrhea) has concerns about Boniva due to bone problems in mouth... 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) 3)  Asthma  (ICD-493.90) 4)  Acute Maxillary Sinusitis  (ICD-461.0) 5)  Eustachian Tube Dysfunction, Right  (ICD-381.81) 6)  Irritable Bowel Syndrome  (ICD-564.1) 7)  Cough  (ICD-786.2) 8)  Family History of Colon Ca 1st Degree Relative <60  (ICD-V16.0) 9)  Family History Diabetes 1st Degree Relative  (ICD-V18.0) 10)  Hyperlipidemia  (ICD-272.4) 11)  Gerd  (ICD-530.81) 12)  Colonic Polyps, Hx of  (ICD-V12.72) 13)  Anemia-nos  (ICD-285.9) 14)  Allergic Rhinitis  (ICD-477.9) 15)  Osteoarthritis  (ICD-715.90) 16)  Hypertension  (ICD-401.9) 17)  Diabetes Mellitus, Type II  (ICD-250.00) 18)  Symptom, Swelling in Head/neck  (ICD-784.2) 19)  Hyperlipidemia  (ICD-272.4) 20)  Headache  (ICD-784.0) 21)  Diabetes Mellitus, Type II  (ICD-250.00) 22)  Vasomotor Rhinitis  (ICD-477.9) 23)  Dm  (ICD-250.00) 24)  Osteoarthritis  (ICD-715.90) 25)  Hypertension  (ICD-401.9) 26)   Allergic Rhinitis  (ICD-477.9)  Current Problems (verified): 1)  Tension Type Headache Unspecified  (ICD-339.10) 2)  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) 3)  Asthma  (ICD-493.90) 4)  Acute Maxillary Sinusitis  (ICD-461.0) 5)  Eustachian Tube Dysfunction, Right  (ICD-381.81) 6)  Irritable Bowel Syndrome  (ICD-564.1) 7)  Cough  (ICD-786.2) 8)  Family History of Colon Ca 1st Degree Relative <60  (ICD-V16.0) 9)  Family History Diabetes 1st Degree Relative  (ICD-V18.0) 10)  Hyperlipidemia  (ICD-272.4) 11)  Gerd  (ICD-530.81) 12)  Colonic Polyps, Hx of  (ICD-V12.72) 13)  Anemia-nos  (ICD-285.9) 14)  Allergic Rhinitis  (ICD-477.9) 15)  Osteoarthritis  (ICD-715.90) 16)  Hypertension  (ICD-401.9) 17)  Diabetes Mellitus, Type II  (ICD-250.00) 18)  Symptom, Swelling in Head/neck  (ICD-784.2) 19)  Hyperlipidemia  (ICD-272.4) 20)  Headache  (ICD-784.0) 21)  Diabetes Mellitus, Type II  (ICD-250.00) 22)  Vasomotor Rhinitis  (ICD-477.9) 23)  Dm  (ICD-250.00) 24)  Osteoarthritis  (ICD-715.90) 25)  Hypertension  (ICD-401.9) 26)  Allergic Rhinitis  (ICD-477.9)  Medications Prior to Update: 1)  Boniva   Tabs (Ibandronate Sodium Tabs) .... Every Month 2)  Starlix 120 Mg  Tabs (Nateglinide) .... Take 1 Tablet By Mouth Three Times A Day 3)  Glucophage 500 Mg  Tabs (Metformin Hcl) .... Take 2 By Mouth Bid 4)  Calcium 600/vitamin D  Tabs (Calcium Carbonate-Vitamin D Tabs) .... Take 1 Tablet By Mouth Two Times A Day 5)  Red Yeast Rice Extract   Caps (Red Yeast Rice Extract Caps) .... Take 1 Tablet By Mouth Once A Day 6)  Cyanocobalamin 1000 Mcg/ml Soln (Cyanocobalamin) .... One Cc Im Evey 3 Weeks 7)  Co Q-10 Plus Red Yeast Rice 60-600 Mg  Caps (Coenzyme Q10-Red Yeast Rice) .Marland Kitchen.. 1 Once Daily 8)  Red Wine Extract Plus   Caps (Misc Natural Products) .... Once Daily 9)  Alprazolam 0.25 Mg  Tabs (Alprazolam) .... As Needed 10)  Astepro 0.15 % Soln (Azelastine Hcl) .Marland Kitchen.. 1-2 Puffs Each Nostril Two Times A Day As  Needed 11)  Tarka 2-180 Mg  Tbcr (Trandolapril-Verapamil Hcl) .Marland Kitchen.. 1 Once Daily 12)  Zantac 150 Maximum Strength 150 Mg  Tabs (Ranitidine Hcl) .... Otc At The Drug Stor One A Night For At Least 2 Weeks Then As Needed  When You Feel Bloated 13)  Allegra 60 Mg  Tabs (Fexofenadine Hcl) .Marland Kitchen.. 1 Two Times A Day As Needed For Allergy 14)  Qvar 80 Mcg/act Aers (Beclomethasone Dipropionate) .... 2 Puffs and Rinse, Twice Daily 15)  Furosemide 20 Mg Tabs (Furosemide) .Marland Kitchen.. 1 Once Daily As Needed 16)  D 1000 Plus  Tabs (Fa-Cyanocobalamin-B6-D-Ca) .Marland Kitchen.. 1 Two Times A Day 17)  Benzonatate 100 Mg Caps (Benzonatate) .... One By Mouth Q 6 Hours As Needed Cough 18)  Ferrous Sulfate 325 (65 Fe) Mg Tbec (Ferrous Sulfate) .Marland Kitchen.. 1 Once Daily 19)  Colestid 5 Gm Gran (Colestipol Hcl) .... 5gm in Water Daily ( [place] Have Flavors If She Desires) in Am As Directed  Current Medications (verified): 1)  Starlix 120 Mg  Tabs (Nateglinide) .... Take 1 Tablet By Mouth Three Times A Day 2)  Janumet 50-1000 Mg Tabs (Sitagliptin-Metformin Hcl) .... One By Mouth Two Times A Day 3)  Calcium 600/vitamin D   Tabs (Calcium Carbonate-Vitamin D Tabs) .... Take 1 Tablet By Mouth Two Times A Day 4)  Red Yeast Rice Extract   Caps (Red Yeast Rice Extract Caps) .... Take 1 Tablet By Mouth Once A Day 5)  Cyanocobalamin 1000 Mcg/ml Soln (Cyanocobalamin) .... One Cc Im Evey 3 Weeks 6)  Co Q-10 Plus Red Yeast Rice 60-600 Mg  Caps (Coenzyme Q10-Red Yeast Rice) .Marland Kitchen.. 1 Once Daily 7)  Red Wine Extract Plus   Caps (Misc Natural Products) .... Once Daily 8)  Alprazolam 0.25 Mg  Tabs (Alprazolam) .... As Needed 9)  Astepro 0.15 % Soln (Azelastine Hcl) .Marland Kitchen.. 1-2 Puffs Each Nostril Two Times A Day As Needed 10)  Tarka 2-180 Mg  Tbcr (Trandolapril-Verapamil Hcl) .Marland Kitchen.. 1 Once Daily 11)  Zantac 150 Maximum Strength 150 Mg  Tabs (Ranitidine Hcl) .... Otc At The Drug St[facility] One A Night For At Least 2 Weeks Then As Needed  When You Feel Bloated 12)  Allegra 60 Mg   Tabs (Fexofenadine Hcl) .Marland Kitchen.. 1 Two Times A Day As Needed For Allergy 13)  Qvar 80 Mcg/act Aers (Beclomethasone Dipropionate) .... 2 Puffs and Rinse, Twice Daily 14)  Furosemide 20 Mg Tabs (Furosemide) .Marland Kitchen.. 1 Once Daily As Needed 15)  D 1000 Plus  Tabs (Fa-Cyanocobalamin-B6-D-Ca) .Marland Kitchen.. 1 Two Times A Day 16)  Benzonatate 100 Mg Caps (Benzonatate) .... One By Mouth Q 6 Hours As Needed Cough 17)  Ferrous Sulfate 325 (65 Fe) Mg Tbec (Ferrous Sulfate) .Marland Kitchen.. 1 Once Daily 18)  Colestid 5 Gm Gran (Colestipol Hcl) .... 5gm in Water Daily ( [place]  Have Flavors If She Desires) in Am As Directed 19)  Promethazine Hcl 25 Mg Tabs (Promethazine Hcl) .Marland Kitchen.. 1 Every 4-6 Hours As Needed Nausea and Vomiting 20)  Hydroxyzine Hcl 25 Mg Tabs (Hydroxyzine Hcl) .Marland Kitchen.. 1 Three Times A Day As Needed Itching  Allergies (verified): 1)  ! * Ivp Dye 2)  ! Duricef 3)  ! Sulfa 4)  ! Penicillin 5)  ! Tegretol 6)  ! Questran 7)  ! Ru-Tuss 8)  ! Actonel 9)  ! Atacand 10)  ! Norvasc 11)  ! Mobic 12)  ! * Bextra 13)  ! Micardis 14)  ! Asa 15)  ! * Zephrex La 16)  ! Tarka 17)  ! Sular 18)  ! Morphine 19)  ! Omnicef 20)  ! Clonidine Hcl 21)  ! Catapres 22)  ! Verapamil Hcl 23)  ! Solu-Medrol 24)  ! * Dust 25)  ! * Smoke 26)  ! * Ivp Dye 27)  ! Duricef 28)  ! * Sulphur 29)  ! Penicillin 30)  ! Tegretol 31)  ! * Cinebac 32)  ! * Nicobid 33)  ! * Questran 34)  ! * Ru-Tuss 35)  ! * Actonel 36)  ! * Atacand 37)  ! Norvasc 38)  ! * Mobic 39)  ! * Bextra 40)  ! * Ketek 41)  ! * Prevachol 42)  ! Hydrocodone 43)  ! Relafen 44)  ! * Niaspan 45)  ! Vicodin 46)  ! * Zephrex La 47)  ! Aspirin 48)  ! Seldane 49)  ! Codeine 50)  ! Vioxx 51)  ! Naprosyn 52)  ! * Zetia 53)  ! Celebrex 54)  ! * Loetrel 55)  ! * Micardis 56)  ! * Ziac 57)  ! * Tarka 58)  ! * Sular 59)  ! Morphine 60)  ! * Omnicef 61)  ! * Questran 62)  ! * Optivar Eye Drops 63)  ! * Clonodine 64)  ! Verapamil 65)  ! * Solumderol 66)  ! *  Pneumonia 67)  ! * Pollen 68)  ! * Mold  Past History:  Family History: Last updated: 12/16/2006 Family History of Cardiovascular disorder Family History Diabetes 1st degree relative Family History of Colon CA 1st degree relative <60  Social History: Last updated: 09/05/2007 Retired Single Patient never smoked.   Risk Factors: Exercise: no (03/23/2007)  Risk Factors: Smoking Status: never (03/06/2010)  Past medical, surgical, family and social histories (including risk factors) reviewed, and no changes noted (except as noted below).  Past Medical History: Reviewed history from 12/16/2006 and no changes required. Allergies Diabetes mellitus, type II Hypertension Osteoarthritis Palpatations Allergic rhinitis Anemia-NOS Colonic polyps, hx of GERD Hyperlipidemia Chronic Facial Pain Acral Numbness Low Vit B12 Levels Upper Arm Knots Dylipidemia Chronic Foot Pain Chronic Dry Cough Obesity  Past Surgical History: Reviewed history from 12/16/2006 and no changes required. Colonoscopy Small Adenomatous polyp removal  Family History: Reviewed history from 12/16/2006 and no changes required. Family History of Cardiovascular disorder Family History Diabetes 1st degree relative Family History of Colon CA 1st degree relative <60  Social History: Reviewed history from 09/05/2007 and no changes required. Retired Single Patient never smoked.   Review of Systems  The patient denies anorexia, fever, weight loss, weight gain, vision loss, decreased hearing, hoarseness, chest pain, syncope, dyspnea on exertion, peripheral edema, prolonged cough, headaches, hemoptysis, abdominal pain, melena, hematochezia, severe indigestion/heartburn, hematuria, incontinence, genital sores, muscle weakness, suspicious skin lesions, transient blindness, difficulty  walking, depression, unusual weight change, abnormal bleeding, enlarged lymph nodes, angioedema, and breast masses.    Physical  Exam  General:  healthy appearing.  well-developed.   Eyes:  pupils equal and pupils round.   Ears:  R ear normal and L ear normal.   Nose:  no external deformity and no nasal discharge.   Mouth:  pharyngeal erythema and posterior lymphoid hypertrophy.   Neck:  No deformities, masses, or tenderness noted. Lungs:  Normal respiratory effort, chest expands symmetrically. Lungs are clear to auscultation, no crackles or wheezes. Heart:  Normal rate and regular rhythm. S1 and S2 normal without gallop, murmur, click, rub or other extra sounds. Abdomen:  Bowel sounds positive,abdomen soft and non-tender without masses, organomegaly or hernias noted.   Impression & Recommendations:  Problem # 1:  OSTEOPOROSIS (ICD-733.00)  stop the boniva and moniter for prolia The following medications were removed from the medication list:    Boniva 150 Mg Tabs (Ibandronate sodium) .Marland Kitchen... 1 ewvery month  Discussed medication use, applications of heat or ice, and exercises.   Orders: T-Bone Densitometry [phone])  Problem # 2:  DUMPING SYNDROME (ICD-564.2)  Problem # 3:  HYPERLIPIDEMIA (ICD-272.4)  Her updated medication list for this problem includes:    Colestid 5 Gm Gran (Colestipol hcl) .Marland Kitchen... 5gm in water daily ( [place] have flavors if she desires) in am as directed  Lipid Goals: Chol Goal: 200 (05/17/2008)   HDL Goal: 40 (05/17/2008)   LDL Goal: 100 (05/17/2008)   TG Goal: 150 (05/17/2008)  Prior 10 Yr Risk Heart Disease: Not enough information (03/23/2007)   HDL:48 (12/05/2009), 42.10 (09/05/2009)  Chol:126 (12/05/2009), 151 (09/05/2009)  Problem # 4:  GERD (ICD-530.81)  Her updated medication list for this problem includes:    Zantac 150 Maximum Strength 150 Mg Tabs (Ranitidine hcl) ..... Otc at the drug stor one a night for at least 2 weeks then as needed  when you feel bloated  Labs Reviewed: Hgb: 11.3 (09/05/2009)   Hct: 32.5 (09/05/2009)  Problem # 5:  DIABETES MELLITUS, TYPE II  (ICD-250.00)  Her updated medication list for this problem includes:    Starlix 120 Mg Tabs (Nateglinide) .Marland Kitchen... Take 1 tablet by mouth three times a day    Janumet 50-1000 Mg Tabs (Sitagliptin-metformin hcl) ..... One by mouth two times a day    Tarka 2-180 Mg Tbcr (Trandolapril-verapamil hcl) .Marland Kitchen... 1 once daily due to increased A1c and rising CBGs  Labs Reviewed: Creat: 0.7 (09/05/2009)     Last Eye Exam: normal (01/02/2010) Reviewed HgBA1c results: 8.2 (12/05/2009)  7.4 (09/05/2009)  Orders: Venipuncture (60454) TLB-A1C / Hgb A1C (Glycohemoglobin) (83036-A1C)  Complete Medication List: 1)  Starlix 120 Mg Tabs (Nateglinide) .... Take 1 tablet by mouth three times a day 2)  Janumet 50-1000 Mg Tabs (Sitagliptin-metformin hcl) .... One by mouth two times a day 3)  Calcium 600/vitamin D Tabs (Calcium carbonate-vitamin d tabs) .... Take 1 tablet by mouth two times a day 4)  Red Yeast Rice Extract Caps (Red yeast rice extract caps) .... Take 1 tablet by mouth once a day 5)  Cyanocobalamin 1000 Mcg/ml Soln (Cyanocobalamin) .... One cc im evey 3 weeks 6)  Co Q-10 Plus Red Yeast Rice 60-600 Mg Caps (Coenzyme q10-red yeast rice) .Marland Kitchen.. 1 once daily 7)  Red Wine Extract Plus Caps (Misc natural products) .... Once daily 8)  Alprazolam 0.25 Mg Tabs (Alprazolam) .... As needed 9)  Astepro 0.15 % Soln (Azelastine hcl) .Marland Kitchen.. 1-2 puffs each nostril  two times a day as needed 10)  Tarka 2-180 Mg Tbcr (Trandolapril-verapamil hcl) .Marland Kitchen.. 1 once daily 11)  Zantac 150 Maximum Strength 150 Mg Tabs (Ranitidine hcl) .... Otc at the drug stor one a night for at least 2 weeks then as needed  when you feel bloated 12)  Allegra 60 Mg Tabs (Fexofenadine hcl) .Marland Kitchen.. 1 two times a day as needed for allergy 13)  Qvar 80 Mcg/act Aers (Beclomethasone dipropionate) .... 2 puffs and rinse, twice daily 14)  Furosemide 20 Mg Tabs (Furosemide) .Marland Kitchen.. 1 once daily as needed 15)  D 1000 Plus Tabs (Fa-cyanocobalamin-b6-d-ca) .Marland Kitchen.. 1  two times a day 16)  Benzonatate 100 Mg Caps (Benzonatate) .... One by mouth q 6 hours as needed cough 17)  Ferrous Sulfate 325 (65 Fe) Mg Tbec (Ferrous sulfate) .Marland Kitchen.. 1 once daily 18)  Colestid 5 Gm Gran (Colestipol hcl) .... 5gm in water daily ( [place] have flavors if she desires) in am as directed 19)  Promethazine Hcl 25 Mg Tabs (Promethazine hcl) .Marland Kitchen.. 1 every 4-6 hours as needed nausea and vomiting 20)  Hydroxyzine Hcl 25 Mg Tabs (Hydroxyzine hcl) .Marland Kitchen.. 1 three times a day as needed itching  Patient Instructions: 1)  stop the boniva 2)  Please schedule a follow-up appointment in 2 months. Prescriptions: JANUMET 50-1000 MG TABS (SITAGLIPTIN-METFORMIN HCL) one by mouth two times a day  #60` x 0   Entered and Authorized by:   Stacie Glaze MD   Signed by:   Stacie Glaze MD on 03/06/2010   Method used:   Print then Give to Patient   RxID:   [id number] ALPRAZOLAM 0.25 MG  TABS (ALPRAZOLAM) as needed  #180 x 3   Entered by:   Willy Eddy, LPN   Authorized by:   Stacie Glaze MD   Signed by:   Willy Eddy, LPN on 14/78/2956   Method used:   Print then Give to Patient   RxID:   [id number] BONIVA 150 MG TABS (IBANDRONATE SODIUM) 1 ewvery month  #3 x 3   Entered by:   Willy Eddy, LPN   Authorized by:   Stacie Glaze MD   Signed by:   Willy Eddy, LPN on 28/41/3244   Method used:   Electronically to        [facility] (mail-order)       [facility]       [address]       [place], [place]  [address]       Ph: [phone]       Fax: [phone]   RxID:   [id number] HYDROXYZINE HCL 25 MG TABS (HYDROXYZINE HCL) 1 three times a day as needed itching  #270 x 1   Entered by:   Willy Eddy, LPN   Authorized by:   Stacie Glaze MD   Signed by:   Willy Eddy, LPN on 63/05/6008   Method used:   Electronically to        [facility] (mail-order)       [facility]       [address]       [place], [place]  [address]       Ph: [phone]       Fax: [phone]   RxID:   [id number] PROMETHAZINE HCL 25 MG TABS (PROMETHAZINE HCL) 1 every 4-6 hours as needed nausea and vomiting  #30 x  1   Entered by:   Willy Eddy, LPN   Authorized by:   Stacie Glaze MD   Signed by:   Willy Eddy, LPN on 16/02/9603   Method used:   Electronically to        [facility] (mail-order)       [facility]       [address]       [place], [place]  [address]       Ph: [phone]       Fax: [phone]   RxID:   [id number] COLESTID 5 GM GRAN (COLESTIPOL HCL) 5gm in water daily ( [place] have flavors if she desires) in AM as directed  #90day x 3   Entered by:   Willy Eddy, LPN   Authorized by:   Stacie Glaze MD   Signed by:   Willy Eddy, LPN on 02/28/2535   Method used:   Electronically to        [facility] (mail-order)       [facility]       [address]       [place], [place]  [address]       Ph: [phone]       Fax: [phone]   RxID:   [id number] FUROSEMIDE 20 MG TABS (FUROSEMIDE) 1 once daily as needed  #90 x 3   Entered by:   Willy Eddy, LPN   Authorized by:   Stacie Glaze MD   Signed by:   Willy Eddy, LPN on 32/35/5732   Method used:   Electronically to        [facility] (mail-order)       [facility]       [address]       [place], [place]  [address]       Ph: [phone]       Fax: [phone]   RxID:   [id number] Jolene Provost 2-180 MG  TBCR (TRANDOLAPRIL-VERAPAMIL HCL) 1 once daily  #90 x 3   Entered by:   Willy Eddy, LPN   Authorized by:   Stacie Glaze MD   Signed by:   Willy Eddy, LPN on 35/00/9381   Method used:   Electronically to        [facility] (mail-order)       [facility]       [address]       [place], [place]  [address]       Ph:  [phone]       Fax: [phone]   RxID:   [id number] CYANOCOBALAMIN 1000 MCG/ML SOLN (CYANOCOBALAMIN) one cc IM evey 3 weeks  #10cc vial x 1   Entered by:   Willy Eddy, LPN   Authorized by:   Stacie Glaze MD   Signed by:   Willy Eddy, LPN on 15/40/0867   Method used:   Electronically to        [facility] (mail-order)       [facility]       [address]       [place], [place]  [address]       Ph: [phone]       Fax: [phone]   RxID:   [id number]    Orders Added: 1)  Est. Patient Level IV [09735] 2)  T-Bone Densitometry [77080] 3)  Venipuncture [36415] 4)  TLB-A1C / Hgb A1C (Glycohemoglobin) [83036-A1C]  Appended Document: Orders Update     Clinical Lists Changes  Orders: Added new Service order of Specimen Handling ([id number]) - Signed

## 2010-06-05 NOTE — Progress Notes (Signed)
Summary: returned call  Phone Note Call from Patient Call back at Home Phone [phone]   Caller: Patient Call For: young Summary of Call: pt returned call from [place]. says Florentina Addison called her a wk ago but pt has been out. wants katie (not triage nurse) to call her back tomorrow morning. pt is going to mow her grass now.  Initial call taken by: Tivis Ringer, CNA,  Rock 16, 2011 9:17 AM  Follow-up for Phone Call        Pt aware of results.Reynaldo Minium CMA  Kretz 17, 2011 9:21 AM

## 2010-06-05 NOTE — Assessment & Plan Note (Signed)
Summary: 1 MO ROA//VN   Vital Signs:  Patient Profile:   77 Years Old Female Height:     68 inches Weight:      146 pounds Temp:     98.6 degrees F oral Pulse rate:   72 / minute Resp:     14 per minute BP sitting:   120 / 70  (left arm)  Vitals Entered By: Willy Eddy, LPN (December 22, 2006 12:33 PM)               Chief Complaint:  ROA/ CONTINUES TO TALK ABOUT CHANGING TARKA TO OTHER BP MED.  History of Present Illness: enlarged costochodrial joint at the right clavicle  Hypertension Follow-Up      This is a 77 year old woman who presents for Hypertension follow-up.  cant tolerate most BP meds.  The patient reports urinary frequency.  The patient denies the following associated symptoms: chest pain, chest pressure, exercise intolerance, dyspnea, palpitations, syncope, leg edema, and pedal edema.  Compliance with medications (by patient report) has been sporadic.  The patient reports that dietary compliance has been good.  The patient reports exercising 3-4X per week.  Complains of diarrhea from the tarka and cough  Current Allergies: ! * DUST ! * SMOKE ! * IVP DYE ! DURICEF ! * SULPHUR ! PENICILLIN ! TEGRETOL ! * CINEBAC ! * NICOBID ! Lanetta Inch ! * RU-TUSS ! * ACTONEL ! * ATACAND ! NORVASC ! * MOBIC ! * BEXTRA ! Johnathan Hausen ! * PREVACHOL ! HYDROCODONE ! RELAFEN ! * NIASPAN ! VICODIN ! * ZEPHREX LA ! ASPIRIN ! SELDANE ! CODEINE ! VIOXX ! NAPROSYN ! * ZETIA ! CELEBREX ! * LOETREL ! * MICARDIS ! [facility] ! Jolene Provost ! * SULAR ! MORPHINE ! * OMNICEF ! Lanetta Inch ! * OPTIVAR EYE DROPS ! * CLONODINE ! VERAPAMIL ! * SOLUMDEROL ! * POLLEN ! * MOLD  Past Medical History:    Reviewed history from 12/16/2006 and no changes required:       Allergies       Diabetes mellitus, type II       Hypertension       Osteoarthritis       Palpatations       Allergic rhinitis       Anemia-NOS       Colonic polyps, hx of       GERD       Hyperlipidemia  Chronic Facial Pain       Acral Numbness       Low Vit B12 Levels       Upper Arm Knots       Dylipidemia       Chronic Foot Pain       Chronic Dry Cough       Obesity  Past Surgical History:    Reviewed history from 12/16/2006 and no changes required:       Colonoscopy       Small Adenomatous polyp removal   Family History:    Reviewed history from 12/16/2006 and no changes required:       Family History of Cardiovascular disorder       Family History Diabetes 1st degree relative       Family History of Colon CA 1st degree relative <60  Social History:    Reviewed history from 12/16/2006 and no changes required:       Retired  Single    Review of Systems       The patient complains of abdominal pain.  The patient denies weight loss, decreased hearing, hoarseness, chest pain, dyspnea on exhertion, peripheral edema, prolonged cough, melena, hematochezia, severe indigestion/heartburn, hematuria, and incontinence.     Physical Exam  General:     eldery wf in nad Head:     normocephalic.   Ears:     R ear normal and L ear normal.   Nose:     no external deformity.   Mouth:     pharynx pink and moist.   Neck:     No deformities, masses, or tenderness noted. Chest Wall:     No deformities, masses, or tenderness noted. Lungs:     normal respiratory effort and no wheezes.   Heart:     normal rate and regular rhythm.   Abdomen:     soft, non-tender, and bowel sounds hyperactive.   Msk:     normal ROM and no joint tenderness.   Pulses:     R and L carotid,radial,femoral,dorsalis pedis and posterior tibial pulses are full and equal bilaterally    Impression & Recommendations:  Problem # 1:  HYPERTENSION (ICD-401.9)  The following medications were removed from the medication list:    Catapres-tts-1 0.1 Mg/24hr Ptwk (Clonidine hcl)    Verapamil Hcl 80 Mg Tabs (Verapamil hcl)    Bystolic 5 Mg Tabs (Nebivolol hcl)  Her updated medication list for this  problem includes:    Lasix 20 Mg Tabs (Furosemide) ..... Once daily    Tarka 2-180 Mg Tbcr (Trandolapril-verapamil hcl) .Marland Kitchen... As needed1/2 tablet  BP today: 120/70   The following medications were removed from the medication list:    Catapres-tts-1 0.1 Mg/24hr Ptwk (Clonidine hcl)    Tarka 2-180 Mg Tbcr (Trandolapril-verapamil hcl) .Marland Kitchen... As needed\par    Verapamil Hcl 80 Mg Tabs (Verapamil hcl)    Bystolic 5 Mg Tabs (Nebivolol hcl)  Her updated medication list for this problem includes:    Lasix 20 Mg Tabs (Furosemide) ..... Once daily    Tekturna 150 Mg Tabs (Aliskiren fumarate) ..... One a day   Problem # 2:  GERD (ICD-530.81)  Diagnostics Reviewed:  Discussed lifestyle modifications, diet, antacids/medications, and preventive measures. Handout provided.   Problem # 3:  DIABETES MELLITUS, TYPE II (ICD-250.00)  The following medications were removed from the medication list:    Tarka 2-180 Mg Tbcr (Trandolapril-verapamil hcl) .Marland Kitchen... As needed\par  Her updated medication list for this problem includes:    Starlix 120 Mg Tabs (Nateglinide) .Marland Kitchen... Tid    Glucophage 500 Mg Tabs (Metformin hcl) .Marland Kitchen... Tid   Complete Medication List: 1)  Cyanocobalamin 1000 Mcg/ml Soln (Cyanocobalamin) .... Every month 2)  Starlix 120 Mg Tabs (Nateglinide) .... Tid 3)  Glucophage 500 Mg Tabs (Metformin hcl) .... Tid 4)  Niacin Cr 500 Mg Tbcr (Niacin) .... Once daily 5)  Patanol 0.1 % Soln (Olopatadine hcl) .... As needed 6)  Lasix 20 Mg Tabs (Furosemide) .... Once daily 7)  Red Wine Extract Plus Caps (Misc natural products) .... Once daily 8)  Alprazolam 0.25 Mg Tabs (Alprazolam) .... As needed 9)  Darvocet-n 100 100-650 Mg Tabs (Propoxyphene n-apap) .... As needed 10)  Ultram Er 100 Mg Tb24 (Tramadol hcl) .... As needed\par 11)  Tekturna 150 Mg Tabs (Aliskiren fumarate) .... One a day   Patient Instructions: 1)  return in 6 weeks

## 2010-06-05 NOTE — Progress Notes (Signed)
Summary: another question  Phone Note Call from Patient Call back at [facility] Phone [phone]   Caller: Patient Call For: Meeghan Skipper Summary of Call: PULSE IS 118 BP 184/85 AND PATIENT TOOK TARKA AND NOW IT IS 116-PULSE AND BP IS 161/80 WANTS TO KNOW WHAT SHOULD SHE DO Initial call taken by: Barnie Mort,  February 07, 2007 10:50 AM  Follow-up for Phone Call        Per Dr. Lovell Sheehan will have to stay on the Tarka despite the diarrhea.  Patient advised.  Says her left arm tightness has subsided, now a weak feeling.  Concerned about having a heart attack.  142/98 P-98.  Still jittery.  Bags under eyes - eyelid surgery 2+wks ago.  Can she take extra furosemide?  She thinks she's retaining fluid.  Also says the Jolene Provost makes her kidneys stop working.       Follow-up by: Rudy Jew, RN,  February 07, 2007 11:39 AM  Additional Follow-up for Phone Call Additional follow up Details #1::        Guillette take an extra 1/2 lasix today    Additional Follow-up for Phone Call Additional follow up Details #2::    Patient advised per Dr. York Ram order that she can toake an extra 1/2 lasix today. Follow-up by: Rudy Jew, RN,  February 07, 2007 1:42 PM

## 2010-06-05 NOTE — Assessment & Plan Note (Signed)
Summary: 2 MONTH ROV/NJR   Vital Signs:  Patient Profile:   77 Years Old Female Height:     68 inches Weight:      148 pounds Temp:     98.4 degrees F oral Pulse rate:   76 / minute Resp:     14 per minute BP sitting:   136 / 80  (left arm)  Vitals Entered By: Willy Eddy, LPN (May 17, 2008 10:42 AM)                 PCP:  Stacie Glaze MD  Chief Complaint:  roa.  History of Present Illness: Had an elevated A1C and she though it was due to changing the verapamil she refused the mew medications she changes the verapamil to bedtime The diarrhea is the mail side effect but it is tolerable immodium is the only med that works for it  Lipid Management History:      Positive NCEP/ATP III risk factors include female age 38 years old or older, diabetes, and hypertension.  Negative NCEP/ATP III risk factors include non-tobacco-user status.       Prior Medication List:  CYANOCOBALAMIN 1000 MCG/ML SOLN (CYANOCOBALAMIN) one cc IM every month STARLIX 120 MG  TABS (NATEGLINIDE) TID GLUCOPHAGE 500 MG  TABS (METFORMIN HCL) TID CO Q-10 PLUS RED YEAST RICE 60-600 MG  CAPS (COENZYME Q10-RED YEAST RICE) 1 once daily RED WINE EXTRACT PLUS   CAPS (MISC NATURAL PRODUCTS) once daily ALPRAZOLAM 0.25 MG  TABS (ALPRAZOLAM) as needed BONIVA 150 MG  TABS (IBANDRONATE SODIUM) one by mouth monthy ASTELIN 137 MCG/SPRAY  SOLN (AZELASTINE HCL) two sprays q nare BID TARKA 2-180 MG  TBCR (TRANDOLAPRIL-VERAPAMIL HCL) 1 once daily ZANTAC 150 MAXIMUM STRENGTH 150 MG  TABS (RANITIDINE HCL) OTC at the drug stor one a night for at least 2 weeks then as needed  when you feel bloated SOLARAZE 3 %  GEL (DICLOFENAC SODIUM) apply twicedaily to area ALLEGRA 60 MG  TABS (FEXOFENADINE HCL) 1 two times a day as needed for allergy QVAR 80 MCG/ACT AERS (BECLOMETHASONE DIPROPIONATE) 2 puffs and rinse, twice daily FUROSEMIDE 20 MG TABS (FUROSEMIDE) 1 once daily as needed LEVSIN/SL 0.125 MG SUBL (HYOSCYAMINE  SULFATE) one by mouth q 2-4 hour when has diarrhea   Current Allergies (reviewed today): ! * DUST ! * SMOKE ! * IVP DYE ! DURICEF ! * SULPHUR ! PENICILLIN ! TEGRETOL ! * CINEBAC ! * NICOBID ! Lanetta Inch ! * RU-TUSS ! * ACTONEL ! * ATACAND ! NORVASC ! * MOBIC ! * BEXTRA ! Johnathan Hausen ! * PREVACHOL ! HYDROCODONE ! RELAFEN ! * NIASPAN ! VICODIN ! * ZEPHREX LA ! ASPIRIN ! SELDANE ! CODEINE ! VIOXX ! NAPROSYN ! * ZETIA ! CELEBREX ! * LOETREL ! * MICARDIS ! [facility] ! Jolene Provost ! * SULAR ! MORPHINE ! * OMNICEF ! Lanetta Inch ! * OPTIVAR EYE DROPS ! * CLONODINE ! VERAPAMIL ! * SOLUMDEROL ! * POLLEN ! * MOLD  Past Medical History:    Reviewed history from 12/16/2006 and no changes required:       Allergies       Diabetes mellitus, type II       Hypertension       Osteoarthritis       Palpatations       Allergic rhinitis       Anemia-NOS       Colonic polyps, hx of  GERD       Hyperlipidemia       Chronic Facial Pain       Acral Numbness       Low Vit B12 Levels       Upper Arm Knots       Dylipidemia       Chronic Foot Pain       Chronic Dry Cough       Obesity  Past Surgical History:    Reviewed history from 12/16/2006 and no changes required:       Colonoscopy       Small Adenomatous polyp removal   Family History:    Reviewed history from 12/16/2006 and no changes required:       Family History of Cardiovascular disorder       Family History Diabetes 1st degree relative       Family History of Colon CA 1st degree relative <60  Social History:    Reviewed history from 09/05/2007 and no changes required:       Retired       Single       Patient never smoked.    Risk Factors: Tobacco use:  never Exercise:  no   Review of Systems       The patient complains of hoarseness.  The patient denies anorexia, fever, weight loss, weight gain, vision loss, decreased hearing, chest pain, syncope, peripheral edema, prolonged cough, headaches,  hemoptysis, abdominal pain, melena, hematochezia, severe indigestion/heartburn, hematuria, incontinence, genital sores, muscle weakness, suspicious skin lesions, transient blindness, difficulty walking, depression, unusual weight change, abnormal bleeding, enlarged lymph nodes, angioedema, and breast masses.         the astepro helped and not using it daily, allergies then is the presumptive cause of the cough   Physical Exam  General:     healthy appearing.  well-developed.   Eyes:     No corneal or conjunctival inflammation noted. EOMI. Perrla. Funduscopic exam benign, without hemorrhages, exudates or papilledema. Vision grossly normal. Ears:     External ear exam shows no significant lesions or deformities.  Otoscopic examination reveals clear canals, tympanic membranes are intact bilaterally without bulging, retraction, inflammation or discharge. Hearing is grossly normal bilaterally. Mouth:     pharyngeal erythema and posterior lymphoid hypertrophy.   Neck:     No deformities, masses, or tenderness noted. Lungs:     Normal respiratory effort, chest expands symmetrically. Lungs are clear to auscultation, no crackles or wheezes. Heart:     Normal rate and regular rhythm. S1 and S2 normal without gallop, murmur, click, rub or other extra sounds. Abdomen:     Bowel sounds positive,abdomen soft and non-tender without masses, organomegaly or hernias noted.    Impression & Recommendations:  Problem # 1:  IRRITABLE BOWEL SYNDROME (ICD-564.1) diet and lomotil  Problem # 2:  HYPERLIPIDEMIA (ICD-272.4)  Her updated medication list for this problem includes:    Lovaza 1 Gm Caps (Omega-3-acid ethyl esters) .Marland Kitchen... 2 two times a day  Lipid Goals: Chol Goal: 200 (05/17/2008)   HDL Goal: 40 (05/17/2008)   LDL Goal: 100 (05/17/2008)   TG Goal: 150 (05/17/2008)  Prior 10 Yr Risk Heart Disease: Not enough information (03/23/2007)   Problem # 3:  HYPERTENSION (ICD-401.9)  Her updated  medication list for this problem includes:    Tarka 2-180 Mg Tbcr (Trandolapril-verapamil hcl) .Marland Kitchen... 1 once daily    Furosemide 20 Mg Tabs (Furosemide) .Marland Kitchen... 1 once daily as needed  BP  today: 136/80 Prior BP: 140/78 (03/21/2008)  Prior 10 Yr Risk Heart Disease: Not enough information (03/23/2007)  Labs Reviewed: Creat: 0.9 (09/28/2007)   Complete Medication List: 1)  Cyanocobalamin 1000 Mcg/ml Soln (Cyanocobalamin) .... One cc im every month 2)  Starlix 120 Mg Tabs (Nateglinide) .... Tid 3)  Glucophage 500 Mg Tabs (Metformin hcl) .... Tid 4)  Co Q-10 Plus Red Yeast Rice 60-600 Mg Caps (Coenzyme q10-red yeast rice) .Marland Kitchen.. 1 once daily 5)  Red Wine Extract Plus Caps (Misc natural products) .... Once daily 6)  Alprazolam 0.25 Mg Tabs (Alprazolam) .... As needed 7)  Boniva 150 Mg Tabs (Ibandronate sodium) .... One by mouth monthy 8)  Astelin 137 Mcg/spray Soln (Azelastine hcl) .... Two sprays q nare bid 9)  Tarka 2-180 Mg Tbcr (Trandolapril-verapamil hcl) .Marland Kitchen.. 1 once daily 10)  Zantac 150 Maximum Strength 150 Mg Tabs (Ranitidine hcl) .... Otc at the drug stor one a night for at least 2 weeks then as needed  when you feel bloated 11)  Solaraze 3 % Gel (Diclofenac sodium) .... Apply twicedaily to area 12)  Allegra 60 Mg Tabs (Fexofenadine hcl) .Marland Kitchen.. 1 two times a day as needed for allergy 13)  Qvar 80 Mcg/act Aers (Beclomethasone dipropionate) .... 2 puffs and rinse, twice daily 14)  Furosemide 20 Mg Tabs (Furosemide) .Marland Kitchen.. 1 once daily as needed 15)  Lovaza 1 Gm Caps (Omega-3-acid ethyl esters) .... 2 two times a day 16)  Calcium 600/vitamin D 600-400 Mg-unit Chew (Calcium carbonate-vitamin d) .Marland Kitchen.. 1 two times a day 17)  D 1000 Plus Tabs (Fa-cyanocobalamin-b6-d-ca) .Marland Kitchen.. 1 two times a day 18)  K-tabs 10 Meq Cr-tabs (Potassium chloride) .Marland Kitchen.. 1 once daily 19)  Astepro 137 Mcg/spray Soln (Azelastine hcl) .... Use as directed  Other Orders: Vit B12 1000 mcg (J3420) Admin of Therapeutic Inj   intramuscular or subcutaneous (60454)  Lipid Assessment/Plan:      Based on NCEP/ATP III, the patient's risk factor category is "history of diabetes".  From this information, the patient's calculated lipid goals are as follows: Total cholesterol goal is 200; LDL cholesterol goal is 100; HDL cholesterol goal is 40; Triglyceride goal is 150.     Patient Instructions: 1)  Please schedule a follow-up appointment in 3 months.   Prescriptions: TARKA 2-180 MG  TBCR (TRANDOLAPRIL-VERAPAMIL HCL) 1 once daily  #90 x 3   Entered and Authorized by:   Stacie Glaze MD   Signed by:   Stacie Glaze MD on 05/17/2008   Method used:   Print then Give to Patient   RxID:   [id number] ASTELIN 137 MCG/SPRAY  SOLN (AZELASTINE HCL) two sprays q nare BID  #3 vials x 3   Entered and Authorized by:   Stacie Glaze MD   Signed by:   Stacie Glaze MD on 05/17/2008   Method used:   Print then Give to Patient   RxID:   [id number]    Medication Administration  Injection # 1:    Medication: Vit B12 1000 mcg    Diagnosis: ANEMIA-NOS (ICD-285.9)    Route: IM    Site: L deltoid    Exp Date: 12/04/2009    Lot #: [id number]    Mfr: [place]    Patient tolerated injection without complications    Given by: Willy Eddy, LPN (May 17, 2008 11:12 AM)  Orders Added: 1)  Vit B12 1000 mcg [J3420] 2)  Admin of Therapeutic Inj  intramuscular or subcutaneous [[id number]] 3)  Est. Patient Level IV [16109]  Appended Document: 2 MONTH ROV/NJR b12 injection on this chart in error- flag sent to [facility] to remove charge

## 2010-06-05 NOTE — Consult Note (Signed)
Summary: [facility]  [facility]   Imported By: Lanelle Bal 11/10/2007 15:22:33  _____________________________________________________________________  External Attachment:    Type:   Image     Comment:   External Document

## 2010-06-05 NOTE — Assessment & Plan Note (Signed)
Summary: FOLLOW UP/ MBW   PCP:  Stacie Glaze MD  Chief Complaint:  yearly follow up.  History of Present Illness: Current Problems:  ACUTE CYSTITIS (ICD-595.0) FAMILY HISTORY OF COLON CA 1ST DEGREE RELATIVE <60 (ICD-V16.0) FAMILY HISTORY DIABETES 1ST DEGREE RELATIVE (ICD-V18.0) HYPERLIPIDEMIA (ICD-272.4) GERD (ICD-530.81) COLONIC POLYPS, HX OF (ICD-V12.72) ANEMIA-NOS (ICD-285.9) ALLERGIC RHINITIS (ICD-477.9) OSTEOARTHRITIS (ICD-715.90) HYPERTENSION (ICD-401.9) DIABETES MELLITUS, TYPE II (ICD-250.00)  From 09/05/07- [name] returns for scheduled follow-up.  She says the pollen now is causing increased postnasal drip, which makes her nauseated.  She has been using Allegra D., but it raises her blood pressure.  She has used erythromycin ointment on a sore in her nose.  She likes Astelin nasal spray.  She had been on allergy vaccine for 10 years and quit.  11.3.09- Allergic rhinitis, multiple environmental sensitivities Has not had recent problems, except truck fumes make maxillary areas hurt. Dry cough intermittent ande occasional, withut wheeze. Benzonatate doesn't help. Recognizes dry cough with ACEI- Tarka- which we discussed. Says it is annoying, but it was hard to find effective BP med she could tolerate.         Prior Medications Reviewed Using: List Brought by Patient  Updated Prior Medication List: CYANOCOBALAMIN 1000 MCG/ML SOLN (CYANOCOBALAMIN) one cc IM every month STARLIX 120 MG  TABS (NATEGLINIDE) TID GLUCOPHAGE 500 MG  TABS (METFORMIN HCL) TID CO Q-10 PLUS RED YEAST RICE 60-600 MG  CAPS (COENZYME Q10-RED YEAST RICE) 1 once daily RED WINE EXTRACT PLUS   CAPS (MISC NATURAL PRODUCTS) once daily ALPRAZOLAM 0.25 MG  TABS (ALPRAZOLAM) as needed ULTRAM ER 100 MG  TB24 (TRAMADOL HCL) as needed\par BONIVA 150 MG  TABS (IBANDRONATE SODIUM) one by mouth monthy ASTELIN 137 MCG/SPRAY  SOLN (AZELASTINE HCL) two sprays q nare BID TARKA 2-180 MG  TBCR (TRANDOLAPRIL-VERAPAMIL  HCL) 1/2 by mouth every other day y ZANTAC 150 MAXIMUM STRENGTH 150 MG  TABS (RANITIDINE HCL) OTC at the drug stor one a night for at least 2 weeks then as needed  when you feel bloated SOLARAZE 3 %  GEL (DICLOFENAC SODIUM) apply twicedaily to area ALLEGRA 60 MG  TABS (FEXOFENADINE HCL) 1 two times a day as needed for allergy  Current Allergies (reviewed today): ! * DUST ! * SMOKE ! * IVP DYE ! DURICEF ! * SULPHUR ! PENICILLIN ! TEGRETOL ! * CINEBAC ! * NICOBID ! Lanetta Inch ! * RU-TUSS ! * ACTONEL ! * ATACAND ! NORVASC ! * MOBIC ! * BEXTRA ! Johnathan Hausen ! * PREVACHOL ! HYDROCODONE ! RELAFEN ! * NIASPAN ! VICODIN ! * ZEPHREX LA ! ASPIRIN ! SELDANE ! CODEINE ! VIOXX ! NAPROSYN ! * ZETIA ! CELEBREX ! * LOETREL ! * MICARDIS ! [facility] ! Jolene Provost ! * SULAR ! MORPHINE ! * OMNICEF ! Lanetta Inch ! * OPTIVAR EYE DROPS ! * CLONODINE ! VERAPAMIL ! * SOLUMDEROL ! * POLLEN ! * MOLD  Past Medical History:    Reviewed history from 12/16/2006 and no changes required:       Allergies       Diabetes mellitus, type II       Hypertension       Osteoarthritis       Palpatations       Allergic rhinitis       Anemia-NOS       Colonic polyps, hx of       GERD       Hyperlipidemia  Chronic Facial Pain       Acral Numbness       Low Vit B12 Levels       Upper Arm Knots       Dylipidemia       Chronic Foot Pain       Chronic Dry Cough       Obesity     Review of Systems       Denies headache, chest pain, dyspnea, n/v/d, weight loss, fever, edema.     Vital Signs:  Patient Profile:   77 Years Old Female Height:     68 inches Weight:      152.50 pounds O2 Sat:      100 % O2 treatment:    Room Air Pulse rate:   71 / minute BP sitting:   122 / 66  (left arm) Cuff size:   regular  Vitals Entered By: Reynaldo Minium CMA (March 06, 2008 10:20 AM)             Comments Medications reviewed with patient Reynaldo Minium CMA  March 06, 2008 10:21 AM           Impression & Recommendations:  Problem # 1:  ALLERGIC RHINITIS (ICD-477.9) Nonspecific irritants seem more disturbing than specific allergens. General measures reviewed. The following medications were removed from the medication list:    Promethazine Hcl 25 Mg Tabs (Promethazine hcl) ..... One by mouth q 4-6 hours  Her updated medication list for this problem includes:    Astelin 137 Mcg/spray Soln (Azelastine hcl) .Marland Kitchen..Marland Kitchen Two sprays q nare bid    Allegra 60 Mg Tabs (Fexofenadine hcl) .Marland Kitchen... 1 two times a day as needed for allergy   Problem # 2:  COUGH (ICD-786.2) ACEI cough is part of this. She can discuss with Dr. Lovell Sheehan. Try Qvar.  Medications Added to Medication List This Visit: 1)  Qvar 80 Mcg/act Aers (Beclomethasone dipropionate) .... 2 puffs and rinse, twice daily   Patient Instructions: 1)  Please schedule a follow-up appointment in 6 months. 2)  Try the Qvar inhaler, 2 puffs and rinse twice daily. If it seems to be helping the cough, then we can rewrite a prescription to [facility] to [facility]. 3)  If Qvar hasn't helped the cough by the time you see Dr. Lovell Sheehan, then tell him we are suspicious the Jolene Provost is causing it.   Prescriptions: QVAR 80 MCG/ACT AERS (BECLOMETHASONE DIPROPIONATE) 2 puffs and rinse, twice daily  #1 x prn   Entered and Authorized by:   Waymon Budge MD   Signed by:   Waymon Budge MD on 03/06/2008   Method used:   Print then Give to Patient   RxID:   [id number]  ]

## 2010-06-05 NOTE — Assessment & Plan Note (Signed)
Summary: 2 MONTH ROV/NJR   Vital Signs:  Patient profile:   76 year old female Height:      68 inches Weight:      146 pounds BMI:     22.28 Temp:     98.2 degrees F oral Pulse rate:   68 / minute Resp:     14 per minute BP sitting:   120 / 80  (left arm)  Vitals Entered By: Willy Eddy, LPN (October 11, 2008 10:16 AM)  Primary Care Provider:  Stacie Glaze MD  CC:  roa labs.  History of Present Illness: increased energy and lass fatigue  Hypertension History:      She denies headache, chest pain, palpitations, dyspnea with exertion, orthopnea, PND, peripheral edema, visual symptoms, neurologic problems, syncope, and side effects from treatment.        Positive major cardiovascular risk factors include female age 53 years old or older, diabetes, hyperlipidemia, and hypertension.  Negative major cardiovascular risk factors include non-tobacco-user status.     Problems Prior to Update: 1)  Acute Maxillary Sinusitis  (ICD-461.0) 2)  Eustachian Tube Dysfunction, Right  (ICD-381.81) 3)  Irritable Bowel Syndrome  (ICD-564.1) 4)  Cough  (ICD-786.2) 5)  Family History of Colon Ca 1st Degree Relative <60  (ICD-V16.0) 6)  Family History Diabetes 1st Degree Relative  (ICD-V18.0) 7)  Hyperlipidemia  (ICD-272.4) 8)  Gerd  (ICD-530.81) 9)  Colonic Polyps, Hx of  (ICD-V12.72) 10)  Anemia-nos  (ICD-285.9) 11)  Allergic Rhinitis  (ICD-477.9) 12)  Osteoarthritis  (ICD-715.90) 13)  Hypertension  (ICD-401.9) 14)  Diabetes Mellitus, Type II  (ICD-250.00)  Medications Prior to Update: 1)  Cyanocobalamin 1000 Mcg/ml Soln (Cyanocobalamin) .... One Cc Im Evey 3 Weeks 2)  Starlix 120 Mg  Tabs (Nateglinide) .... Tid 3)  Glucophage 500 Mg  Tabs (Metformin Hcl) .... Tid 4)  Co Q-10 Plus Red Yeast Rice 60-600 Mg  Caps (Coenzyme Q10-Red Yeast Rice) .Marland Kitchen.. 1 Once Daily 5)  Red Wine Extract Plus   Caps (Misc Natural Products) .... Once Daily 6)  Alprazolam 0.25 Mg  Tabs (Alprazolam) .... As  Needed 7)  Boniva 150 Mg  Tabs (Ibandronate Sodium) .... One By Mouth Monthy 8)  Astelin 137 Mcg/spray  Soln (Azelastine Hcl) .... Two Sprays Q Nare Bid 9)  Tarka 2-180 Mg  Tbcr (Trandolapril-Verapamil Hcl) .Marland Kitchen.. 1 Once Daily 10)  Zantac 150 Maximum Strength 150 Mg  Tabs (Ranitidine Hcl) .... Otc At The Drug Stor One A Night For At Least 2 Weeks Then As Needed  When You Feel Bloated 11)  Solaraze 3 %  Gel (Diclofenac Sodium) .... Apply Twicedaily To Area 12)  Allegra 60 Mg  Tabs (Fexofenadine Hcl) .Marland Kitchen.. 1 Two Times A Day As Needed For Allergy 13)  Qvar 80 Mcg/act Aers (Beclomethasone Dipropionate) .... 2 Puffs and Rinse, Twice Daily 14)  Furosemide 20 Mg Tabs (Furosemide) .Marland Kitchen.. 1 Once Daily As Needed 15)  Lovaza 1 Gm Caps (Omega-3-Acid Ethyl Esters) .... 2 Two Times A Day 16)  Calcium 600/vitamin D 600-400 Mg-Unit Chew (Calcium Carbonate-Vitamin D) .Marland Kitchen.. 1 Two Times A Day 17)  D 1000 Plus  Tabs (Fa-Cyanocobalamin-B6-D-Ca) .Marland Kitchen.. 1 Two Times A Day 18)  K-Tabs 10 Meq Cr-Tabs (Potassium Chloride) .Marland Kitchen.. 1 Once Daily 19)  Astepro 137 Mcg/spray Soln (Azelastine Hcl) .... Use As Directed 20)  Benzonatate 100 Mg Caps (Benzonatate) .... One By Mouth Q 6 Hours As Needed Cough 21)  Ferrous Sulfate 325 (65 Fe) Mg Tbec (Ferrous  Sulfate) .Marland Kitchen.. 1 Once Daily  Current Medications (verified): 1)  Cyanocobalamin 1000 Mcg/ml Soln (Cyanocobalamin) .... One Cc Im Evey 3 Weeks 2)  Starlix 120 Mg  Tabs (Nateglinide) .... Tid 3)  Glucophage 500 Mg  Tabs (Metformin Hcl) .... Tid 4)  Co Q-10 Plus Red Yeast Rice 60-600 Mg  Caps (Coenzyme Q10-Red Yeast Rice) .Marland Kitchen.. 1 Once Daily 5)  Red Wine Extract Plus   Caps (Misc Natural Products) .... Once Daily 6)  Alprazolam 0.25 Mg  Tabs (Alprazolam) .... As Needed 7)  Boniva 150 Mg  Tabs (Ibandronate Sodium) .... One By Mouth Monthy 8)  Astelin 137 Mcg/spray  Soln (Azelastine Hcl) .... Two Sprays Q Nare Bid 9)  Tarka 2-180 Mg  Tbcr (Trandolapril-Verapamil Hcl) .Marland Kitchen.. 1 Once Daily 10)   Zantac 150 Maximum Strength 150 Mg  Tabs (Ranitidine Hcl) .... Otc At The Drug Stor One A Night For At Least 2 Weeks Then As Needed  When You Feel Bloated 11)  Solaraze 3 %  Gel (Diclofenac Sodium) .... Apply Twicedaily To Area 12)  Allegra 60 Mg  Tabs (Fexofenadine Hcl) .Marland Kitchen.. 1 Two Times A Day As Needed For Allergy 13)  Qvar 80 Mcg/act Aers (Beclomethasone Dipropionate) .... 2 Puffs and Rinse, Twice Daily 14)  Furosemide 20 Mg Tabs (Furosemide) .Marland Kitchen.. 1 Once Daily As Needed 15)  Lovaza 1 Gm Caps (Omega-3-Acid Ethyl Esters) .... 2 Two Times A Day 16)  Calcium 600/vitamin D 600-400 Mg-Unit Chew (Calcium Carbonate-Vitamin D) .Marland Kitchen.. 1 Two Times A Day 17)  D 1000 Plus  Tabs (Fa-Cyanocobalamin-B6-D-Ca) .Marland Kitchen.. 1 Two Times A Day 18)  K-Tabs 10 Meq Cr-Tabs (Potassium Chloride) .Marland Kitchen.. 1 Once Daily 19)  Astepro 137 Mcg/spray Soln (Azelastine Hcl) .... Use As Directed 20)  Benzonatate 100 Mg Caps (Benzonatate) .... One By Mouth Q 6 Hours As Needed Cough 21)  Ferrous Sulfate 325 (65 Fe) Mg Tbec (Ferrous Sulfate) .... One By Mouth Three Times A Week  Allergies (verified): 1)  ! * Dust 2)  ! * Smoke 3)  ! * Ivp Dye 4)  ! Duricef 5)  ! * Sulphur 6)  ! Penicillin 7)  ! Tegretol 8)  ! * Cinebac 9)  ! * Nicobid 10)  ! * Questran 11)  ! * Ru-Tuss 12)  ! * Actonel 13)  ! * Atacand 14)  ! Norvasc 15)  ! * Mobic 16)  ! * Bextra 17)  ! * Ketek 18)  ! * Prevachol 19)  ! Hydrocodone 20)  ! Relafen 21)  ! * Niaspan 22)  ! Vicodin 23)  ! * Zephrex La 24)  ! Aspirin 25)  ! Seldane 26)  ! Codeine 27)  ! Vioxx 28)  ! Naprosyn 29)  ! * Zetia 30)  ! Celebrex 31)  ! * Loetrel 32)  ! * Micardis 33)  ! * Ziac 34)  ! * Tarka 35)  ! * Sular 36)  ! Morphine 37)  ! * Omnicef 38)  ! * Questran 39)  ! * Optivar Eye Drops 40)  ! * Clonodine 41)  ! Verapamil 42)  ! * Solumderol 43)  ! * Pollen 44)  ! * Mold  Past History:  Family History: Last updated: 12/16/2006 Family History of Cardiovascular  disorder Family History Diabetes 1st degree relative Family History of Colon CA 1st degree relative <60  Social History: Last updated: 09/05/2007 Retired Single Patient never smoked.   Risk Factors: Exercise: no (03/23/2007)  Risk Factors: Smoking Status: never (  09/05/2007)  Past medical, surgical, family and social histories (including risk factors) reviewed, and no changes noted (except as noted below).  Past Medical History: Reviewed history from 12/16/2006 and no changes required. Allergies Diabetes mellitus, type II Hypertension Osteoarthritis Palpatations Allergic rhinitis Anemia-NOS Colonic polyps, hx of GERD Hyperlipidemia Chronic Facial Pain Acral Numbness Low Vit B12 Levels Upper Arm Knots Dylipidemia Chronic Foot Pain Chronic Dry Cough Obesity  Past Surgical History: Reviewed history from 12/16/2006 and no changes required. Colonoscopy Small Adenomatous polyp removal  Family History: Reviewed history from 12/16/2006 and no changes required. Family History of Cardiovascular disorder Family History Diabetes 1st degree relative Family History of Colon CA 1st degree relative <60  Social History: Reviewed history from 09/05/2007 and no changes required. Retired Single Patient never smoked.   Review of Systems       The patient complains of decreased hearing.  The patient denies anorexia, fever, weight loss, weight gain, vision loss, hoarseness, chest pain, syncope, dyspnea on exertion, peripheral edema, prolonged cough, headaches, hemoptysis, abdominal pain, melena, hematochezia, severe indigestion/heartburn, hematuria, incontinence, genital sores, muscle weakness, suspicious skin lesions, transient blindness, difficulty walking, depression, unusual weight change, abnormal bleeding, enlarged lymph nodes, angioedema, and breast masses.    Physical Exam  General:  healthy appearing.  well-developed.   Head:  normocephalic.   Eyes:  No corneal or  conjunctival inflammation noted. EOMI. Perrla. Funduscopic exam benign, without hemorrhages, exudates or papilledema. Vision grossly normal. Ears:  External ear exam shows no significant lesions or deformities.  Otoscopic examination reveals clear canals, tympanic membranes are intact bilaterally without bulging, retraction, inflammation or discharge. Hearing is grossly normal bilaterally. Nose:  nasal dischargemucosal pallor, mucosal edema, and airflow obstruction.   Mouth:  pharyngeal erythema and posterior lymphoid hypertrophy.   Neck:  No deformities, masses, or tenderness noted. Lungs:  Normal respiratory effort, chest expands symmetrically. Lungs are clear to auscultation, no crackles or wheezes. Heart:  Normal rate and regular rhythm. S1 and S2 normal without gallop, murmur, click, rub or other extra sounds. Abdomen:  Bowel sounds positive,abdomen soft and non-tender without masses, organomegaly or hernias noted.   Impression & Recommendations:  Problem # 1:  HYPERTENSION (ICD-401.9)  Her updated medication list for this problem includes:    Tarka 2-180 Mg Tbcr (Trandolapril-verapamil hcl) .Marland Kitchen... 1 once daily    Furosemide 20 Mg Tabs (Furosemide) .Marland Kitchen... 1 once daily as needed  BP today: 120/80 Prior BP: 134/82 (09/05/2008)  Prior 10 Yr Risk Heart Disease: Not enough information (03/23/2007)  Labs Reviewed: K+: 3.9 (09/28/2007) Creat: : 0.9 (09/28/2007)     Problem # 2:  IRRITABLE BOWEL SYNDROME (ICD-564.1) constistipation phase with increased gas the iron is causing constipation and gas her usually presentaton is loose stools  Problem # 3:  ANEMIA-NOS (ICD-285.9)  Her updated medication list for this problem includes:    Cyanocobalamin 1000 Mcg/ml Soln (Cyanocobalamin) ..... One cc im evey 3 weeks    D 1000 Plus Tabs (Fa-cyanocobalamin-b6-d-ca) .Marland Kitchen... 1 two times a day    Ferrous Sulfate 325 (65 Fe) Mg Tbec (Ferrous sulfate) ..... One by mouth three times a week  Complete  Medication List: 1)  Cyanocobalamin 1000 Mcg/ml Soln (Cyanocobalamin) .... One cc im evey 3 weeks 2)  Starlix 120 Mg Tabs (Nateglinide) .... Tid 3)  Glucophage 500 Mg Tabs (Metformin hcl) .... Tid 4)  Co Q-10 Plus Red Yeast Rice 60-600 Mg Caps (Coenzyme q10-red yeast rice) .Marland Kitchen.. 1 once daily 5)  Red Wine Extract Plus Caps (Misc  natural products) .... Once daily 6)  Alprazolam 0.25 Mg Tabs (Alprazolam) .... As needed 7)  Boniva 150 Mg Tabs (Ibandronate sodium) .... One by mouth monthy 8)  Astelin 137 Mcg/spray Soln (Azelastine hcl) .... Two sprays q nare bid 9)  Tarka 2-180 Mg Tbcr (Trandolapril-verapamil hcl) .Marland Kitchen.. 1 once daily 10)  Zantac 150 Maximum Strength 150 Mg Tabs (Ranitidine hcl) .... Otc at the drug stor one a night for at least 2 weeks then as needed  when you feel bloated 11)  Solaraze 3 % Gel (Diclofenac sodium) .... Apply twicedaily to area 12)  Allegra 60 Mg Tabs (Fexofenadine hcl) .Marland Kitchen.. 1 two times a day as needed for allergy 13)  Qvar 80 Mcg/act Aers (Beclomethasone dipropionate) .... 2 puffs and rinse, twice daily 14)  Furosemide 20 Mg Tabs (Furosemide) .Marland Kitchen.. 1 once daily as needed 15)  Lovaza 1 Gm Caps (Omega-3-acid ethyl esters) .... 2 two times a day 16)  Calcium 600/vitamin D 600-400 Mg-unit Chew (Calcium carbonate-vitamin d) .Marland Kitchen.. 1 two times a day 17)  D 1000 Plus Tabs (Fa-cyanocobalamin-b6-d-ca) .Marland Kitchen.. 1 two times a day 18)  K-tabs 10 Meq Cr-tabs (Potassium chloride) .Marland Kitchen.. 1 once daily 19)  Astepro 137 Mcg/spray Soln (Azelastine hcl) .... Use as directed 20)  Benzonatate 100 Mg Caps (Benzonatate) .... One by mouth q 6 hours as needed cough 21)  Ferrous Sulfate 325 (65 Fe) Mg Tbec (Ferrous sulfate) .... One by mouth three times a week  Hypertension Assessment/Plan:      The patient's hypertensive risk group is category C: Target organ damage and/or diabetes.  Today's blood pressure is 120/80.  Her blood pressure goal is < 130/80.  Patient Instructions: 1)  Please schedule a  follow-up appointment in 3 months.

## 2010-06-05 NOTE — Assessment & Plan Note (Signed)
Summary: 6 months/apc   Primary Provider/Referring Provider:  Stacie Glaze MD  CC:  6 month follow up visit.  History of Present Illness:  11.3.09- Allergic rhinitis, multiple environmental sensitivities Has not had recent problems, except truck fumes make maxillary areas hurt. Dry cough intermittent ande occasional, withut wheeze. Benzonatate doesn't help. Recognizes dry cough with ACEI- Tarka- which we discussed. Says it is annoying, but it was hard to find effective BP med she could tolerate.  09/05/08- Allergic rhinitis, multiple environmental sensitivities Distraught- dog just died, now alone.  Difficult spring pollen season, esp in March. Now persstent dry cough. Right maxillary area soreness . Right ear pressure discomfort. Astepro helps but too sweet. Denies wheeze, purulent or bloody discharge .  March 05, 2009- Allergic rhinitis, multiple environmental sensitivities Was on antibbiotic for cataract surgery. since she finished that she notes a little supraorbital tenderness on right. Fexofenadine helps drainage in throat but dries her eye. Using an eye ointment. Had pneumovax again this year- got red large local reaction. Got flu vax. Denies chest problems- cough or wheeze.  [place]  3, 2011- Allergic rhinitis, multiple environmental sensitivities Staying in to avoid pollen. Inside or right nostril gets sore. Astelin has helped her and makes it easier to tolerate odors.Colvin Caroli occasional cough and asks about a cough suppressant she can carry in her purse. Gets right occipital headaches. She is concerned about the shape of her right clavicle.  Current Medications (verified): 1)  Cyanocobalamin 1000 Mcg/ml Soln (Cyanocobalamin) .... One Cc Im Evey 3 Weeks 2)  Starlix 120 Mg  Tabs (Nateglinide) .... Tid 3)  Glucophage 500 Mg  Tabs (Metformin Hcl) .... Tid 4)  Co Q-10 Plus Red Yeast Rice 60-600 Mg  Caps (Coenzyme Q10-Red Yeast Rice) .Marland Kitchen.. 1 Once Daily 5)  Red Wine Extract Plus   Caps  (Misc Natural Products) .... Once Daily 6)  Alprazolam 0.25 Mg  Tabs (Alprazolam) .... As Needed 7)  Boniva 150 Mg  Tabs (Ibandronate Sodium) .... One By Mouth Monthy 8)  Astelin 137 Mcg/spray  Soln (Azelastine Hcl) .Marland Kitchen.. 1-2 Puffs Each Nostril Up To Twice Daily If Needed 9)  Tarka 2-180 Mg  Tbcr (Trandolapril-Verapamil Hcl) .Marland Kitchen.. 1 Once Daily 10)  Zantac 150 Maximum Strength 150 Mg  Tabs (Ranitidine Hcl) .... Otc At The Drug Stor One A Night For At Least 2 Weeks Then As Needed  When You Feel Bloated 11)  Solaraze 3 %  Gel (Diclofenac Sodium) .... Apply Twicedaily To Area 12)  Allegra 60 Mg  Tabs (Fexofenadine Hcl) .Marland Kitchen.. 1 Two Times A Day As Needed For Allergy 13)  Qvar 80 Mcg/act Aers (Beclomethasone Dipropionate) .... 2 Puffs and Rinse, Twice Daily 14)  Furosemide 20 Mg Tabs (Furosemide) .Marland Kitchen.. 1 Once Daily As Needed 15)  Lovaza 1 Gm Caps (Omega-3-Acid Ethyl Esters) .Marland Kitchen.. 1 Two Times A Day 16)  Calcium 600/vitamin D 600-400 Mg-Unit Chew (Calcium Carbonate-Vitamin D) .Marland Kitchen.. 1 Two Times A Day 17)  D 1000 Plus  Tabs (Fa-Cyanocobalamin-B6-D-Ca) .Marland Kitchen.. 1 Two Times A Day 18)  Benzonatate 100 Mg Caps (Benzonatate) .... One By Mouth Q 6 Hours As Needed Cough 19)  Ferrous Sulfate 325 (65 Fe) Mg Tbec (Ferrous Sulfate) .... One By Mouth Three Times A Week 20)  Nasonex 50 Mcg/act Susp (Mometasone Furoate) .... Two Sprays in The Nostril Daily 21)  Colestid 5 Gm Gran (Colestipol Hcl) .... 5gm in Water Daily ( Goffredo Have Flavors If She Desires) in Am As Directed  Allergies (verified): 1)  ! *  Dust 2)  ! * Smoke 3)  ! * Ivp Dye 4)  ! Duricef 5)  ! * Sulphur 6)  ! Penicillin 7)  ! Tegretol 8)  ! * Cinebac 9)  ! * Nicobid 10)  ! * Questran 11)  ! * Ru-Tuss 12)  ! * Actonel 13)  ! * Atacand 14)  ! Norvasc 15)  ! * Mobic 16)  ! * Bextra 17)  ! * Ketek 18)  ! * Prevachol 19)  ! Hydrocodone 20)  ! Relafen 21)  ! * Niaspan 22)  ! Vicodin 23)  ! * Zephrex La 24)  ! Aspirin 25)  ! Seldane 26)  !  Codeine 27)  ! Vioxx 28)  ! Naprosyn 29)  ! * Zetia 30)  ! Celebrex 31)  ! * Loetrel 32)  ! * Micardis 33)  ! * Ziac 34)  ! * Tarka 35)  ! * Sular 36)  ! Morphine 37)  ! * Omnicef 38)  ! * Questran 39)  ! * Optivar Eye Drops 40)  ! * Clonodine 41)  ! Verapamil 42)  ! * Solumderol 43)  ! * Pneumonia 44)  ! * Pollen 45)  ! * Mold  Past History:  Past Medical History: Last updated: 12/16/2006 Allergies Diabetes mellitus, type II Hypertension Osteoarthritis Palpatations Allergic rhinitis Anemia-NOS Colonic polyps, hx of GERD Hyperlipidemia Chronic Facial Pain Acral Numbness Low Vit B12 Levels Upper Arm Knots Dylipidemia Chronic Foot Pain Chronic Dry Cough Obesity  Past Surgical History: Last updated: 12/16/2006 Colonoscopy Small Adenomatous polyp removal  Family History: Last updated: 12/16/2006 Family History of Cardiovascular disorder Family History Diabetes 1st degree relative Family History of Colon CA 1st degree relative <60  Social History: Last updated: 09/05/2007 Retired Single Patient never smoked.   Risk Factors: Exercise: no (03/23/2007)  Risk Factors: Smoking Status: never (07/11/2009)  Review of Systems      See HPI  The patient denies anorexia, fever, weight loss, weight gain, vision loss, decreased hearing, hoarseness, chest pain, syncope, dyspnea on exertion, peripheral edema, prolonged cough, headaches, hemoptysis, and severe indigestion/heartburn.    Vital Signs:  Patient profile:   77 year old female Height:      68 inches Weight:      152.38 pounds BMI:     23.25 O2 Sat:      98 % on Room air Pulse rate:   57 / minute BP sitting:   142 / 80  (left arm) Cuff size:   regular  Vitals Entered By: Reynaldo Minium CMA ([place]  3, 2011 10:24 AM)  O2 Flow:  Room air  Physical Exam  Additional Exam:  General: A/Ox3; pleasant and cooperative, NAD, talkative SKIN: no rash, lesions NODES: no lymphadenopathy HEENT: /AT, EOM-  WNL, Conjuctivae- clear, PERRLA, TM-WNL, Nose- clear, Throat- clear and wnl, dry oral mucosa. Mallampati  II NECK: Supple w/ fair ROM, JVD- none, normal carotid impulses w/o bruits Thyroid- CHEST: Clear to P&A, clavicles seem normal with no associated mass. HEART: RRR, no m/g/r heard ABDOMEN:  HYQ:MVHQ, nl pulses, no edema  NEURO: Grossly intact to observation      Impression & Recommendations:  Problem # 1:  ASTHMA (ICD-493.90) Good control with no wheeze now. We will update CXR with attention to her right chest where she worries there Limpert be a change inthe shape of her right clavicle. I suspect age related muscle atrophy makes it more visible to her.  Problem # 2:  ALLERGIC RHINITIS (ICD-477.9)  Seasonal rhinitis, improved. Her updated medication list for this problem includes:    Astelin 137 Mcg/spray Soln (Azelastine hcl) .Marland Kitchen... 1-2 puffs each nostril up to twice daily if needed    Allegra 60 Mg Tabs (Fexofenadine hcl) .Marland Kitchen... 1 two times a day as needed for allergy    Nasonex 50 Mcg/act Susp (Mometasone furoate) .Marland Kitchen..Marland Kitchen Two sprays in the nostril daily  Other Orders: Est. Patient Level III (04540) T-2 View CXR (71020TC)  Patient Instructions: 1)  Please schedule a follow-up appointment in 6 months. 2)  A chest x-ray has been recommended.  Your imaging study [facility] require preauthorization.

## 2010-06-05 NOTE — Assessment & Plan Note (Signed)
Summary: 3 MNTH ROV//SLM   Vital Signs:  Patient profile:   77 year old female Height:      68 inches Weight:      152 pounds BMI:     23.20 Temp:     98.2 degrees F oral Pulse rate:   72 / minute Resp:     14 per minute BP sitting:   144 / 80  (left arm)  Vitals Entered By: Willy Eddy, LPN (January 17, 2009 10:35 AM)  Primary Care Provider:  Stacie Glaze MD  CC:  roa.  History of Present Illness: seeing the endocriniologist for DM and a1c was 7.0 has hot flas h and noted that her blood pressure was up and she took an extra 1/2 verapamil and the flushing went away   Hypertension History:      She denies headache, chest pain, palpitations, dyspnea with exertion, orthopnea, PND, peripheral edema, visual symptoms, neurologic problems, syncope, and side effects from treatment.        Positive major cardiovascular risk factors include female age 78 years old or older, diabetes, hyperlipidemia, and hypertension.  Negative major cardiovascular risk factors include non-tobacco-user status.     Problems Prior to Update: 1)  Acute Maxillary Sinusitis  (ICD-461.0) 2)  Eustachian Tube Dysfunction, Right  (ICD-381.81) 3)  Irritable Bowel Syndrome  (ICD-564.1) 4)  Cough  (ICD-786.2) 5)  Family History of Colon Ca 1st Degree Relative <60  (ICD-V16.0) 6)  Family History Diabetes 1st Degree Relative  (ICD-V18.0) 7)  Hyperlipidemia  (ICD-272.4) 8)  Gerd  (ICD-530.81) 9)  Colonic Polyps, Hx of  (ICD-V12.72) 10)  Anemia-nos  (ICD-285.9) 11)  Allergic Rhinitis  (ICD-477.9) 12)  Osteoarthritis  (ICD-715.90) 13)  Hypertension  (ICD-401.9) 14)  Diabetes Mellitus, Type II  (ICD-250.00)  Medications Prior to Update: 1)  Cyanocobalamin 1000 Mcg/ml Soln (Cyanocobalamin) .... One Cc Im Evey 3 Weeks 2)  Starlix 120 Mg  Tabs (Nateglinide) .... Tid 3)  Glucophage 500 Mg  Tabs (Metformin Hcl) .... Tid 4)  Co Q-10 Plus Red Yeast Rice 60-600 Mg  Caps (Coenzyme Q10-Red Yeast Rice) .Marland Kitchen.. 1  Once Daily 5)  Red Wine Extract Plus   Caps (Misc Natural Products) .... Once Daily 6)  Alprazolam 0.25 Mg  Tabs (Alprazolam) .... As Needed 7)  Boniva 150 Mg  Tabs (Ibandronate Sodium) .... One By Mouth Monthy 8)  Astelin 137 Mcg/spray  Soln (Azelastine Hcl) .... Two Sprays Q Nare Bid 9)  Tarka 2-180 Mg  Tbcr (Trandolapril-Verapamil Hcl) .Marland Kitchen.. 1 Once Daily 10)  Zantac 150 Maximum Strength 150 Mg  Tabs (Ranitidine Hcl) .... Otc At The Drug Stor One A Night For At Least 2 Weeks Then As Needed  When You Feel Bloated 11)  Solaraze 3 %  Gel (Diclofenac Sodium) .... Apply Twicedaily To Area 12)  Allegra 60 Mg  Tabs (Fexofenadine Hcl) .Marland Kitchen.. 1 Two Times A Day As Needed For Allergy 13)  Qvar 80 Mcg/act Aers (Beclomethasone Dipropionate) .... 2 Puffs and Rinse, Twice Daily 14)  Furosemide 20 Mg Tabs (Furosemide) .Marland Kitchen.. 1 Once Daily As Needed 15)  Lovaza 1 Gm Caps (Omega-3-Acid Ethyl Esters) .... 2 Two Times A Day 16)  Calcium 600/vitamin D 600-400 Mg-Unit Chew (Calcium Carbonate-Vitamin D) .Marland Kitchen.. 1 Two Times A Day 17)  D 1000 Plus  Tabs (Fa-Cyanocobalamin-B6-D-Ca) .Marland Kitchen.. 1 Two Times A Day 18)  K-Tabs 10 Meq Cr-Tabs (Potassium Chloride) .Marland Kitchen.. 1 Once Daily 19)  Astepro 137 Mcg/spray Soln (Azelastine Hcl) .... Use As  Directed 20)  Benzonatate 100 Mg Caps (Benzonatate) .... One By Mouth Q 6 Hours As Needed Cough 21)  Ferrous Sulfate 325 (65 Fe) Mg Tbec (Ferrous Sulfate) .... One By Mouth Three Times A Week  Current Medications (verified): 1)  Cyanocobalamin 1000 Mcg/ml Soln (Cyanocobalamin) .... One Cc Im Evey 3 Weeks 2)  Starlix 120 Mg  Tabs (Nateglinide) .... Tid 3)  Glucophage 500 Mg  Tabs (Metformin Hcl) .... Tid 4)  Co Q-10 Plus Red Yeast Rice 60-600 Mg  Caps (Coenzyme Q10-Red Yeast Rice) .Marland Kitchen.. 1 Once Daily 5)  Red Wine Extract Plus   Caps (Misc Natural Products) .... Once Daily 6)  Alprazolam 0.25 Mg  Tabs (Alprazolam) .... As Needed 7)  Boniva 150 Mg  Tabs (Ibandronate Sodium) .... One By Mouth Monthy 8)   Astelin 137 Mcg/spray  Soln (Azelastine Hcl) .... Two Sprays Q Nare Bid 9)  Tarka 2-180 Mg  Tbcr (Trandolapril-Verapamil Hcl) .Marland Kitchen.. 1 Once Daily 10)  Zantac 150 Maximum Strength 150 Mg  Tabs (Ranitidine Hcl) .... Otc At The Drug Stor One A Night For At Least 2 Weeks Then As Needed  When You Feel Bloated 11)  Solaraze 3 %  Gel (Diclofenac Sodium) .... Apply Twicedaily To Area 12)  Allegra 60 Mg  Tabs (Fexofenadine Hcl) .Marland Kitchen.. 1 Two Times A Day As Needed For Allergy 13)  Qvar 80 Mcg/act Aers (Beclomethasone Dipropionate) .... 2 Puffs and Rinse, Twice Daily 14)  Furosemide 20 Mg Tabs (Furosemide) .Marland Kitchen.. 1 Once Daily As Needed 15)  Lovaza 1 Gm Caps (Omega-3-Acid Ethyl Esters) .... 2 Two Times A Day 16)  Calcium 600/vitamin D 600-400 Mg-Unit Chew (Calcium Carbonate-Vitamin D) .Marland Kitchen.. 1 Two Times A Day 17)  D 1000 Plus  Tabs (Fa-Cyanocobalamin-B6-D-Ca) .Marland Kitchen.. 1 Two Times A Day 18)  K-Tabs 10 Meq Cr-Tabs (Potassium Chloride) .Marland Kitchen.. 1 Once Daily 19)  Astepro 137 Mcg/spray Soln (Azelastine Hcl) .... Use As Directed 20)  Benzonatate 100 Mg Caps (Benzonatate) .... One By Mouth Q 6 Hours As Needed Cough 21)  Ferrous Sulfate 325 (65 Fe) Mg Tbec (Ferrous Sulfate) .... One By Mouth Three Times A Week  Allergies (verified): 1)  ! * Dust 2)  ! * Smoke 3)  ! * Ivp Dye 4)  ! Duricef 5)  ! * Sulphur 6)  ! Penicillin 7)  ! Tegretol 8)  ! * Cinebac 9)  ! * Nicobid 10)  ! * Questran 11)  ! * Ru-Tuss 12)  ! * Actonel 13)  ! * Atacand 14)  ! Norvasc 15)  ! * Mobic 16)  ! * Bextra 17)  ! * Ketek 18)  ! * Prevachol 19)  ! Hydrocodone 20)  ! Relafen 21)  ! * Niaspan 22)  ! Vicodin 23)  ! * Zephrex La 24)  ! Aspirin 25)  ! Seldane 26)  ! Codeine 27)  ! Vioxx 28)  ! Naprosyn 29)  ! * Zetia 30)  ! Celebrex 31)  ! * Loetrel 32)  ! * Micardis 33)  ! * Ziac 34)  ! * Tarka 35)  ! * Sular 36)  ! Morphine 37)  ! * Omnicef 38)  ! * Questran 39)  ! * Optivar Eye Drops 40)  ! * Clonodine 41)  ! Verapamil 42)  !  * Solumderol 43)  ! * Pollen 44)  ! * Mold  Past History:  Family History: Last updated: 12/16/2006 Family History of Cardiovascular disorder Family History Diabetes 1st degree relative Family  History of Colon CA 1st degree relative <60  Social History: Last updated: 09/05/2007 Retired Single Patient never smoked.   Risk Factors: Exercise: no (03/23/2007)  Risk Factors: Smoking Status: never (09/05/2007)  Past medical, surgical, family and social histories (including risk factors) reviewed, and no changes noted (except as noted below).  Past Medical History: Reviewed history from 12/16/2006 and no changes required. Allergies Diabetes mellitus, type II Hypertension Osteoarthritis Palpatations Allergic rhinitis Anemia-NOS Colonic polyps, hx of GERD Hyperlipidemia Chronic Facial Pain Acral Numbness Low Vit B12 Levels Upper Arm Knots Dylipidemia Chronic Foot Pain Chronic Dry Cough Obesity  Past Surgical History: Reviewed history from 12/16/2006 and no changes required. Colonoscopy Small Adenomatous polyp removal  Family History: Reviewed history from 12/16/2006 and no changes required. Family History of Cardiovascular disorder Family History Diabetes 1st degree relative Family History of Colon CA 1st degree relative <60  Social History: Reviewed history from 09/05/2007 and no changes required. Retired Single Patient never smoked.   Review of Systems  The patient denies anorexia, fever, weight loss, weight gain, vision loss, decreased hearing, hoarseness, chest pain, syncope, dyspnea on exertion, peripheral edema, prolonged cough, headaches, hemoptysis, abdominal pain, melena, hematochezia, severe indigestion/heartburn, hematuria, incontinence, genital sores, muscle weakness, suspicious skin lesions, transient blindness, difficulty walking, depression, unusual weight change, abnormal bleeding, enlarged lymph nodes, angioedema, breast masses, and testicular  masses.    Physical Exam  General:  healthy appearing.  well-developed.   Head:  normocephalic.   Eyes:  pupils equal and pupils round.   Ears:  R ear normal and L ear normal.   Nose:  nasal dischargemucosal pallor, mucosal edema, and airflow obstruction.   Mouth:  pharyngeal erythema and posterior lymphoid hypertrophy.   Neck:  No deformities, masses, or tenderness noted. Lungs:  Normal respiratory effort, chest expands symmetrically. Lungs are clear to auscultation, no crackles or wheezes. Heart:  Normal rate and regular rhythm. S1 and S2 normal without gallop, murmur, click, rub or other extra sounds. Abdomen:  Bowel sounds positive,abdomen soft and non-tender without masses, organomegaly or hernias noted. Msk:  no joint swelling and no redness over joints.    Diabetes Management Exam:    Foot Exam (with socks and/or shoes not present):       Sensory-Pinprick/Light touch:          Left medial foot (L-4): diminished          Left dorsal foot (L-5): diminished          Left lateral foot (S-1): diminished          Right medial foot (L-4): diminished          Right dorsal foot (L-5): diminished    Eye Exam:       Eye Exam done elsewhere          Date: 12/18/2008          Results: normal          Done by: Priscille Kluver    Impression & Recommendations:  Problem # 1:  HYPERLIPIDEMIA (ICD-272.4) Assessment Unchanged  takes the red rice yueast with the niacin and this Pifer have caused some flushing Her updated medication list for this problem includes:    Lovaza 1 Gm Caps (Omega-3-acid ethyl esters) .Marland Kitchen... 2 two times a day  Lipid Goals: Chol Goal: 200 (05/17/2008)   HDL Goal: 40 (05/17/2008)   LDL Goal: 100 (05/17/2008)   TG Goal: 150 (05/17/2008)  Prior 10 Yr Risk Heart Disease: Not enough information (03/23/2007)  Problem #  2:  GERD (ICD-530.81)  Her updated medication list for this problem includes:    Zantac 150 Maximum Strength 150 Mg Tabs (Ranitidine hcl) ..... Otc at the  drug stor one a night for at least 2 weeks then as needed  when you feel bloated  Labs Reviewed: Hgb: 11.8 (10/04/2008)   Hct: 35.4 (10/04/2008)  Problem # 3:  HYPERTENSION (ICD-401.9)  Her updated medication list for this problem includes:    Tarka 2-180 Mg Tbcr (Trandolapril-verapamil hcl) .Marland Kitchen... 1 once daily    Furosemide 20 Mg Tabs (Furosemide) .Marland Kitchen... 1 once daily as needed  BP today: 144/80 Prior BP: 120/80 (10/11/2008)  Prior 10 Yr Risk Heart Disease: Not enough information (03/23/2007)  Labs Reviewed: K+: 3.9 (09/28/2007) Creat: : 0.9 (09/28/2007)     Problem # 4:  DIABETES MELLITUS, TYPE II (ICD-250.00)  the last a1c wa 7.0 Her updated medication list for this problem includes:    Starlix 120 Mg Tabs (Nateglinide) .Marland Kitchen... Tid    Glucophage 500 Mg Tabs (Metformin hcl) .Marland Kitchen... Tid    Tarka 2-180 Mg Tbcr (Trandolapril-verapamil hcl) .Marland Kitchen... 1 once daily  Labs Reviewed: Creat: 0.9 (09/28/2007)    Reviewed HgBA1c results: 7.0 (11/12/2008)  7.2 (09/28/2007)  Problem # 5:  Preventive Health Care (ICD-V70.0) due Td and pnemovax  Complete Medication List: 1)  Cyanocobalamin 1000 Mcg/ml Soln (Cyanocobalamin) .... One cc im evey 3 weeks 2)  Starlix 120 Mg Tabs (Nateglinide) .... Tid 3)  Glucophage 500 Mg Tabs (Metformin hcl) .... Tid 4)  Co Q-10 Plus Red Yeast Rice 60-600 Mg Caps (Coenzyme q10-red yeast rice) .Marland Kitchen.. 1 once daily 5)  Red Wine Extract Plus Caps (Misc natural products) .... Once daily 6)  Alprazolam 0.25 Mg Tabs (Alprazolam) .... As needed 7)  Boniva 150 Mg Tabs (Ibandronate sodium) .... One by mouth monthy 8)  Astelin 137 Mcg/spray Soln (Azelastine hcl) .... Two sprays q nare bid 9)  Tarka 2-180 Mg Tbcr (Trandolapril-verapamil hcl) .Marland Kitchen.. 1 once daily 10)  Zantac 150 Maximum Strength 150 Mg Tabs (Ranitidine hcl) .... Otc at the drug stor one a night for at least 2 weeks then as needed  when you feel bloated 11)  Solaraze 3 % Gel (Diclofenac sodium) .... Apply twicedaily  to area 12)  Allegra 60 Mg Tabs (Fexofenadine hcl) .Marland Kitchen.. 1 two times a day as needed for allergy 13)  Qvar 80 Mcg/act Aers (Beclomethasone dipropionate) .... 2 puffs and rinse, twice daily 14)  Furosemide 20 Mg Tabs (Furosemide) .Marland Kitchen.. 1 once daily as needed 15)  Lovaza 1 Gm Caps (Omega-3-acid ethyl esters) .... 2 two times a day 16)  Calcium 600/vitamin D 600-400 Mg-unit Chew (Calcium carbonate-vitamin d) .Marland Kitchen.. 1 two times a day 17)  D 1000 Plus Tabs (Fa-cyanocobalamin-b6-d-ca) .Marland Kitchen.. 1 two times a day 18)  K-tabs 10 Meq Cr-tabs (Potassium chloride) .Marland Kitchen.. 1 once daily 19)  Astepro 137 Mcg/spray Soln (Azelastine hcl) .... Use as directed 20)  Benzonatate 100 Mg Caps (Benzonatate) .... One by mouth q 6 hours as needed cough 21)  Ferrous Sulfate 325 (65 Fe) Mg Tbec (Ferrous sulfate) .... One by mouth three times a week  Hypertension Assessment/Plan:      The patient's hypertensive risk group is category C: Target organ damage and/or diabetes.  Today's blood pressure is 144/80.  Her blood pressure goal is < 130/80.  Patient Instructions: 1)  Please schedule a follow-up appointment in 3 months. Prescriptions: LOVAZA 1 GM CAPS (OMEGA-3-ACID ETHYL ESTERS) 2 two times a day  #  360 x 3   Entered by:   Willy Eddy, LPN   Authorized by:   Stacie Glaze MD   Signed by:   Willy Eddy, LPN on 16/02/9603   Method used:   Faxed to ...       [facility] Environmental education officer)       [address] [address]       [place], [place]  [address]       Ph: [phone]       Fax: [phone]   RxID:   [id number] FUROSEMIDE 20 MG TABS (FUROSEMIDE) 1 once daily as needed  #90 x 3   Entered by:   Willy Eddy, LPN   Authorized by:   Stacie Glaze MD   Signed by:   Willy Eddy, LPN on 02/28/2535   Method used:   Faxed to ...       [facility] Environmental education officer)       [address] [address]       [place], [place]  [address]       Ph: [phone]       Fax: [phone]   RxID:   [id number] ALLEGRA 60 MG  TABS  (FEXOFENADINE HCL) 1 two times a day as needed for allergy  #180 x 3   Entered by:   Willy Eddy, LPN   Authorized by:   Stacie Glaze MD   Signed by:   Willy Eddy, LPN on 32/35/5732   Method used:   Faxed to ...       [facility] Environmental education officer)       [address] [address]       [place], [place]  [address]       Ph: [phone]       Fax: [phone]   RxID:   [id number] TARKA 2-180 MG  TBCR (TRANDOLAPRIL-VERAPAMIL HCL) 1 once daily  #90 x 3   Entered by:   Willy Eddy, LPN   Authorized by:   Stacie Glaze MD   Signed by:   Willy Eddy, LPN on 35/00/9381   Method used:   Faxed to ...       [facility] Environmental education officer)       [address] [address]       [place], [place]  [address]       Ph: [phone]       Fax: [phone]   RxID:   [id number] BONIVA 150 MG  TABS (IBANDRONATE SODIUM) one by mouth monthy  #3 x 3   Entered by:   Willy Eddy, LPN   Authorized by:   Stacie Glaze MD   Signed by:   Willy Eddy, LPN on 15/40/0867   Method used:   Faxed to ...       [facility] Environmental education officer)       [address] [address]       [place], [place]  [address]       Ph: [phone]       Fax: [phone]   RxID:   [id number] CYANOCOBALAMIN 1000 MCG/ML SOLN (CYANOCOBALAMIN) one cc IM evey 3 weeks  #10cc vial x 1   Entered by:   Willy Eddy, LPN   Authorized by:   Stacie Glaze MD   Signed by:   Willy Eddy, LPN on 09/73/5329   Method used:   Faxed to ...       [facility] Environmental education officer)       [id number]  [place], [place]  [address]       Ph: [phone]       Fax: [phone]   RxID:   [id number]   Appended Document: Orders Update     Clinical Lists Changes  Orders: Added new Service order of TD Toxoids IM 7 YR + ([address]) - Signed Added new Service order of Admin 1st Vaccine (47425) - Signed Added new Service order of Pneumococcal Vaccine (95638) - Signed Added new Service order of Admin of Any Addtl Vaccine (75643) -  Signed Observations: Added new observation of PNEUMOVAXLOT: [id number]  (01/17/2009 11:26) Added new observation of PNEUMOVAXEXP: 02/25/2010  (01/17/2009 11:26) Added new observation of PNEUMOVAXBY: Willy Eddy, LPN  (41/66/0630 11:26) Added new observation of PNEUMOVAXRTE: IM  (01/17/2009 11:26) Added new observation of PNEUMOVAXDOS: 0.5 ml mL (01/17/2009 11:26) Added new observation of PNEUMOVAXMFR: Merck  (01/17/2009 11:26) Added new observation of PNEUMOVAXSIT: right deltoid  (01/17/2009 11:26) Added new observation of PNEUMOVAX: Pneumovax (Medicare)  (01/17/2009 11:26) Added new observation of TD BOOSTERLO: [id number]  (01/17/2009 11:26) Added new observation of TD BOOST EXP: 07/02/2010  (01/17/2009 11:26) Added new observation of TD BOOSTERBY: Willy Eddy, LPN  (35/57/3220 11:26) Added new observation of TD BOOSTERRT: IM  (01/17/2009 11:26) Added new observation of TDBOOSTERDSE: 0.5 ml  (01/17/2009 11:26) Added new observation of TD BOOSTERMF: Sanofi Pasteur  (01/17/2009 11:26) Added new observation of TD BOOST SIT: left deltoid  (01/17/2009 11:26) Added new observation of TD BOOSTER: Td  (01/17/2009 11:26)       Immunizations Administered:  Tetanus Vaccine:    Vaccine Type: Td    Site: left deltoid    Mfr: Sanofi Pasteur    Dose: 0.5 ml    Route: IM    Given by: Willy Eddy, LPN    Exp. Date: 07/02/2010    Lot #: [id number]  Pneumonia Vaccine:    Vaccine Type: Pneumovax (Medicare)    Site: right deltoid    Mfr: Merck    Dose: 0.5 ml    Route: IM    Given by: Willy Eddy, LPN    Exp. Date: 02/25/2010    Lot #: [id number]

## 2010-06-05 NOTE — Progress Notes (Signed)
Summary: scripts to Dr Shela Commons for sig  Phone Note Call from Patient Call back at [facility] Phone [phone]   Caller: Patient Call For: [name] Summary of Call: CALLED  LAST WEEK FOR RF'S HASN'T RECEIVED THEM  (THESE ARE [facility])  Initial call taken by: Shan Levans,  January 18, 2007 9:56 AM  Follow-up for Phone Call        [facility] fax number [phone]. DOB 2029/09/24.  GN#[phone].  All on fax 3RF & 73mo supply.  Astelin Nasal Spray 30 mg 2 sprays each nostril twice a day.  Furosemide 20 mg one daily. Boniva 150mg  one monthly.  Put on fax:  Credit card is on file.  To Dr. Lovell Sheehan for sig. Follow-up by: Rudy Jew, RN,  January 18, 2007 10:36 AM  Additional Follow-up for Phone Call Additional follow up Details #1::        Scripts signed per Dr. Lennon Alstrom.  To fax. Additional Follow-up by: Rudy Jew, RN,  January 18, 2007 11:26 AM    New/Updated Medications: BONIVA 150 MG  TABS (IBANDRONATE SODIUM) one by mouth monthy ASTELIN 137 MCG/SPRAY  SOLN (AZELASTINE HCL) two sprays q nare BID   Prescriptions: ALPRAZOLAM 0.25 MG  TABS (ALPRAZOLAM) as needed  #180 x 3   Entered by:   Rudy Jew, RN   Authorized by:   Alita Chyle Triage Nurse   Signed by:   Rudy Jew, RN on 01/18/2007   Method used:   Print then Give to Patient   RxID:   [id number] ASTELIN 137 MCG/SPRAY  SOLN (AZELASTINE HCL) two sprays q nare BID  #3 vials x 3   Entered by:   Rudy Jew, RN   Authorized by:   Alita Chyle Triage Nurse   Signed by:   Rudy Jew, RN on 01/18/2007   Method used:   Print then Give to Patient   RxID:   [id number] BONIVA 150 MG  TABS (IBANDRONATE SODIUM) one by mouth monthy  #3 x 3   Entered by:   Rudy Jew, RN   Authorized by:   Alita Chyle Triage Nurse   Signed by:   Rudy Jew, RN on 01/18/2007   Method used:   Print then Give to Patient   RxID:   [id number] LASIX 20 MG  TABS  (FUROSEMIDE) once daily  #90 x 3   Entered by:   Rudy Jew, RN   Authorized by:   Alita Chyle Triage Nurse   Signed by:   Rudy Jew, RN on 01/18/2007   Method used:   Print then Give to Patient   RxID:   [id number]

## 2010-06-05 NOTE — Progress Notes (Signed)
Summary: reaction to pneumonia  Phone Note Call from Patient   Summary of Call: [phone] Arm is swollen and red, hot and hurts down to hand. Initial call taken by: Lynann Beaver CMA,  January 18, 2009 2:51 PM  Follow-up for Phone Call        benadryl and ice it-- Follow-up by: Willy Eddy, LPN,  January 18, 2009 3:03 PM  Additional Follow-up for Phone Call Additional follow up Details #1::        Pt notiffied. Additional Follow-up by: Lynann Beaver CMA,  January 18, 2009 3:09 PM   New Allergies: ! * PNEUMONIA New Allergies: ! * PNEUMONIA

## 2010-06-05 NOTE — Assessment & Plan Note (Signed)
Summary: 3 month rov/njr   Vital Signs:  Patient profile:   77 year old female Height:      68 inches Weight:      152 pounds BMI:     23.20 Temp:     98.2 degrees F oral Pulse rate:   68 / minute Resp:     14 per minute BP sitting:   112 / 74  (left arm)  Vitals Entered By: Willy Eddy, LPN (December 05, 2009 9:29 AM) CC: roa, Type 2 diabetes mellitus follow-up Is Patient Diabetic? Yes Did you bring your meter with you today? No   Primary Care Provider:  Stacie Glaze MD  CC:  roa and Type 2 diabetes mellitus follow-up.  History of Present Illness:  Type 2 Diabetes Mellitus Follow-Up      This is a 77 year old woman who presents for Type 2 diabetes mellitus follow-up.  The patient denies polyuria, polydipsia, blurred vision, self managed hypoglycemia, hypoglycemia requiring help, weight loss, weight gain, and numbness of extremities.  The patient denies the following symptoms: neuropathic pain, chest pain, vomiting, orthostatic symptoms, poor wound healing, intermittent claudication, vision loss, and foot ulcer.  Since the last visit the patient reports poor dietary compliance.  The patient has been measuring capillary blood glucose before breakfast.  Since the last visit, the patient reports having had eye care by an ophthalmologist.  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) 3)  Asthma  (ICD-493.90) 4)  Acute Maxillary Sinusitis  (ICD-461.0) 5)  Eustachian Tube Dysfunction, Right  (ICD-381.81) 6)  Irritable Bowel Syndrome  (ICD-564.1) 7)  Cough  (ICD-786.2) 8)  Family History of Colon Ca 1st Degree Relative <60  (ICD-V16.0) 9)  Family History  Diabetes 1st Degree Relative  (ICD-V18.0) 10)  Hyperlipidemia  (ICD-272.4) 11)  Gerd  (ICD-530.81) 12)  Colonic Polyps, Hx of  (ICD-V12.72) 13)  Anemia-nos  (ICD-285.9) 14)  Allergic Rhinitis  (ICD-477.9) 15)  Osteoarthritis  (ICD-715.90) 16)  Hypertension  (ICD-401.9) 17)  Diabetes Mellitus, Type II  (ICD-250.00) 18)  Symptom, Swelling in Head/neck  (ICD-784.2) 19)  Hyperlipidemia  (ICD-272.4) 20)  Headache  (ICD-784.0) 21)  Diabetes Mellitus, Type II  (ICD-250.00) 22)  Vasomotor Rhinitis  (ICD-477.9) 23)  Dm  (ICD-250.00) 24)  Osteoarthritis  (ICD-715.90) 25)  Hypertension  (ICD-401.9) 26)  Allergic Rhinitis  (ICD-477.9)  Current Problems (verified): 1)  Tension Type Headache Unspecified  (ICD-339.10) 2)  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) 3)  Asthma  (ICD-493.90) 4)  Acute Maxillary Sinusitis  (ICD-461.0) 5)  Eustachian Tube Dysfunction, Right  (ICD-381.81) 6)  Irritable Bowel Syndrome  (ICD-564.1) 7)  Cough  (ICD-786.2) 8)  Family History of Colon Ca 1st Degree Relative <60  (ICD-V16.0) 9)  Family History Diabetes 1st Degree Relative  (ICD-V18.0) 10)  Hyperlipidemia  (ICD-272.4) 11)  Gerd  (ICD-530.81) 12)  Colonic Polyps, Hx of  (ICD-V12.72) 13)  Anemia-nos  (ICD-285.9) 14)  Allergic Rhinitis  (ICD-477.9) 15)  Osteoarthritis  (ICD-715.90) 16)  Hypertension  (ICD-401.9) 17)  Diabetes Mellitus, Type II  (ICD-250.00) 18)  Symptom, Swelling in Head/neck  (ICD-784.2) 19)  Hyperlipidemia  (ICD-272.4) 20)  Headache  (ICD-784.0) 21)  Diabetes Mellitus, Type II  (ICD-250.00) 22)  Vasomotor Rhinitis  (ICD-477.9) 23)  Dm  (ICD-250.00) 24)  Osteoarthritis  (ICD-715.90) 25)  Hypertension  (ICD-401.9) 26)  Allergic Rhinitis  (ICD-477.9)  Medications Prior to Update: 1)  Verapamil Hcl 80 Mg  Tabs (Verapamil Hcl) .... Take 1 Tablet By Mouth Two Times A Day 2)  Boniva   Tabs (Ibandronate Sodium Tabs) .... Every Month 3)  Starlix 120 Mg  Tabs (Nateglinide) .... Take 1 Tablet By Mouth  Three Times A Day 4)  Glucophage 500 Mg  Tabs (Metformin Hcl) .... Take 2 By Mouth Bid 5)  Calcium 600/vitamin D   Tabs (Calcium Carbonate-Vitamin D Tabs) .... Take 1 Tablet By Mouth Two Times A Day 6)  Red Yeast Rice Extract   Caps (Red Yeast Rice Extract Caps) .... Take 1 Tablet By Mouth Once A Day 7)  Niacin 500 Mg  Tbcr (Niacin) .... Take 1 Tablet By Mouth Once A Day 8)  Patanol   Soln (Olopatadine Hcl Soln) 9)  Lasix 20 Mg  Tabs (Furosemide) .... Take 1 Tablet By Mouth Once A Day As Needed 10)  Red Wine Extract Plus   Caps (Misc Natural Products) 11)  Rhinocort Aqua 32 Mcg/act  Susp (Budesonide (Nasal)) 12)  Allegra-D 12 Hour   Tb12 (Fexofenadine-Pseudoephedrine Tb12) 13)  Alprazolam   Tabs (Alprazolam Tabs) 14)  Darvocet-N 100   Tabs (Propoxyphene N-Apap Tabs) 15)  Cyanocobalamin 1000 Mcg/ml Soln (Cyanocobalamin) .... One Cc Im Evey 3 Weeks 16)  Co Q-10 Plus Red Yeast Rice 60-600 Mg  Caps (Coenzyme Q10-Red Yeast Rice) .Marland Kitchen.. 1 Once Daily 17)  Red Wine Extract Plus   Caps (Misc Natural Products) .... Once Daily 18)  Alprazolam 0.25 Mg  Tabs (Alprazolam) .... As Needed 19)  Astelin 137 Mcg/spray  Soln (Azelastine Hcl) .Marland Kitchen.. 1-2 Puffs Each Nostril Up To Twice Daily If Needed 20)  Tarka 2-180 Mg  Tbcr (Trandolapril-Verapamil Hcl) .Marland Kitchen.. 1 Once Daily 21)  Zantac 150 Maximum Strength 150 Mg  Tabs (Ranitidine Hcl) .... Otc At The Drug Stor One A Night For At Least 2 Weeks Then As Needed  When You Feel Bloated 22)  Solaraze 3 %  Gel (Diclofenac Sodium) .... Apply Twicedaily To Area 23)  Allegra 60 Mg  Tabs (Fexofenadine Hcl) .Marland Kitchen.. 1 Two Times A Day As Needed For Allergy 24)  Qvar 80 Mcg/act Aers (Beclomethasone Dipropionate) .... 2 Puffs and Rinse, Twice Daily 25)  Furosemide 20 Mg Tabs (Furosemide) .Marland Kitchen.. 1 Once Daily As Needed 26)  Lovaza 1 Gm Caps (Omega-3-Acid Ethyl Esters) .Marland Kitchen.. 1 Two Times A Day 27)  Calcium 600/vitamin D 600-400 Mg-Unit Chew (Calcium Carbonate-Vitamin D) .Marland Kitchen.. 1 Two Times A  Day 28)  D 1000 Plus  Tabs (Fa-Cyanocobalamin-B6-D-Ca) .Marland Kitchen.. 1 Two Times A Day 29)  Benzonatate 100 Mg Caps (Benzonatate) .... One By Mouth Q 6 Hours As Needed Cough 30)  Ferrous Sulfate 325 (65 Fe) Mg Tbec (Ferrous Sulfate) .Marland Kitchen.. 1 Once Daily 31)  Nasonex 50 Mcg/act Susp (Mometasone Furoate) .... Two Sprays in The Nostril Daily 32)  Colestid 5 Gm Gran (Colestipol Hcl) .... 5gm in Water Daily ( Fini Have Flavors If She Desires) in Am As Directed  Current Medications (verified): 1)  Verapamil Hcl 80 Mg  Tabs (Verapamil Hcl) .... Take 1 Tablet By Mouth Two Times A Day 2)  Boniva   Tabs (Ibandronate Sodium Tabs) .... Every Month 3)  Starlix 120 Mg  Tabs (Nateglinide) .... Take 1 Tablet By Mouth  Three Times A Day 4)  Glucophage 500 Mg  Tabs (Metformin Hcl) .... Take 2 By Mouth Bid 5)  Calcium 600/vitamin D   Tabs (Calcium Carbonate-Vitamin D Tabs) .... Take 1 Tablet By Mouth Two Times A Day 6)  Red Yeast Rice Extract   Caps (Red Yeast Rice Extract Caps) .... Take 1 Tablet By Mouth Once A Day 7)  Niacin 500 Mg  Tbcr (Niacin) .... Take 1 Tablet By Mouth Once A Day 8)  Patanol   Soln (Olopatadine Hcl Soln) 9)  Lasix 20 Mg  Tabs (Furosemide) .... Take 1 Tablet By Mouth Once A Day As Needed 10)  Red Wine Extract Plus   Caps (Misc Natural Products) 11)  Rhinocort Aqua 32 Mcg/act  Susp (Budesonide (Nasal)) 12)  Allegra-D 12 Hour   Tb12 (Fexofenadine-Pseudoephedrine Tb12) 13)  Alprazolam   Tabs (Alprazolam Tabs) 14)  Darvocet-N 100   Tabs (Propoxyphene N-Apap Tabs) 15)  Cyanocobalamin 1000 Mcg/ml Soln (Cyanocobalamin) .... One Cc Im Evey 3 Weeks 16)  Co Q-10 Plus Red Yeast Rice 60-600 Mg  Caps (Coenzyme Q10-Red Yeast Rice) .Marland Kitchen.. 1 Once Daily 17)  Red Wine Extract Plus   Caps (Misc Natural Products) .... Once Daily 18)  Alprazolam 0.25 Mg  Tabs (Alprazolam) .... As Needed 19)  Astelin 137 Mcg/spray  Soln (Azelastine Hcl) .Marland Kitchen.. 1-2 Puffs Each Nostril Up To Twice Daily If Needed 20)  Tarka 2-180 Mg  Tbcr  (Trandolapril-Verapamil Hcl) .Marland Kitchen.. 1 Once Daily 21)  Zantac 150 Maximum Strength 150 Mg  Tabs (Ranitidine Hcl) .... Otc At The Drug Stor One A Night For At Least 2 Weeks Then As Needed  When You Feel Bloated 22)  Solaraze 3 %  Gel (Diclofenac Sodium) .... Apply Twicedaily To Area 23)  Allegra 60 Mg  Tabs (Fexofenadine Hcl) .Marland Kitchen.. 1 Two Times A Day As Needed For Allergy 24)  Qvar 80 Mcg/act Aers (Beclomethasone Dipropionate) .... 2 Puffs and Rinse, Twice Daily 25)  Furosemide 20 Mg Tabs (Furosemide) .Marland Kitchen.. 1 Once Daily As Needed 26)  Lovaza 1 Gm Caps (Omega-3-Acid Ethyl Esters) .Marland Kitchen.. 1 Two Times A Day 27)  Calcium 600/vitamin D 600-400 Mg-Unit Chew (Calcium Carbonate-Vitamin D) .Marland Kitchen.. 1 Two Times A Day 28)  D 1000 Plus  Tabs (Fa-Cyanocobalamin-B6-D-Ca) .Marland Kitchen.. 1 Two Times A Day 29)  Benzonatate 100 Mg Caps (Benzonatate) .... One By Mouth Q 6 Hours As Needed Cough 30)  Ferrous Sulfate 325 (65 Fe) Mg Tbec (Ferrous Sulfate) .Marland Kitchen.. 1 Once Daily 31)  Nasonex 50 Mcg/act Susp (Mometasone Furoate) .... Two Sprays in The Nostril Daily 32)  Colestid 5 Gm Gran (Colestipol Hcl) .... 5gm in Water Daily ( Ishman Have Flavors If She Desires) in Am As Directed  Allergies (verified): 1)  ! * Ivp Dye 2)  ! Duricef 3)  ! Sulfa 4)  ! Penicillin 5)  ! Tegretol 6)  ! Questran 7)  ! Ru-Tuss 8)  ! Actonel 9)  ! Atacand 10)  ! Norvasc 11)  ! Mobic 12)  ! * Bextra 13)  ! Micardis 14)  ! Asa 15)  ! * Zephrex La 16)  ! Tarka 17)  ! Sular 18)  ! Morphine 19)  ! Omnicef 20)  ! Clonidine Hcl 21)  ! Catapres 22)  ! Verapamil Hcl 23)  ! Solu-Medrol 24)  ! * Dust 25)  ! * Smoke 26)  ! * Ivp Dye 27)  ! Duricef 28)  ! * Sulphur 29)  ! Penicillin 30)  ! Tegretol  31)  ! * Cinebac 32)  ! * Nicobid 33)  ! * Questran 34)  ! * Ru-Tuss 35)  ! * Actonel 36)  ! * Atacand 37)  ! Norvasc 38)  ! * Mobic 39)  ! * Bextra 40)  ! * Ketek 41)  ! * Prevachol 42)  ! Hydrocodone 43)  ! Relafen 44)  ! * Niaspan 45)  ! Vicodin 46)   ! * Zephrex La 47)  ! Aspirin 48)  ! Seldane 49)  ! Codeine 50)  ! Vioxx 51)  ! Naprosyn 52)  ! * Zetia 53)  ! Celebrex 54)  ! * Loetrel 55)  ! * Micardis 56)  ! * Ziac 57)  ! * Tarka 58)  ! * Sular 59)  ! Morphine 60)  ! * Omnicef 61)  ! * Questran 62)  ! * Optivar Eye Drops 63)  ! * Clonodine 64)  ! Verapamil 65)  ! * Solumderol 66)  ! * Pneumonia 67)  ! * Pollen 68)  ! * Mold  Past History:  Family History: Last updated: 12/16/2006 Family History of Cardiovascular disorder Family History Diabetes 1st degree relative Family History of Colon CA 1st degree relative <60  Social History: Last updated: 09/05/2007 Retired Single Patient never smoked.   Risk Factors: Exercise: no (03/23/2007)  Risk Factors: Smoking Status: never (12/05/2009)  Past medical, surgical, family and social histories (including risk factors) reviewed, and no changes noted (except as noted below).  Past Medical History: Reviewed history from 12/16/2006 and no changes required. Allergies Diabetes mellitus, type II Hypertension Osteoarthritis Palpatations Allergic rhinitis Anemia-NOS Colonic polyps, hx of GERD Hyperlipidemia Chronic Facial Pain Acral Numbness Low Vit B12 Levels Upper Arm Knots Dylipidemia Chronic Foot Pain Chronic Dry Cough Obesity  Past Surgical History: Reviewed history from 12/16/2006 and no changes required. Colonoscopy Small Adenomatous polyp removal  Family History: Reviewed history from 12/16/2006 and no changes required. Family History of Cardiovascular disorder Family History Diabetes 1st degree relative Family History of Colon CA 1st degree relative <60  Social History: Reviewed history from 09/05/2007 and no changes required. Retired Single Patient never smoked.   Review of Systems  The patient denies anorexia, fever, weight loss, weight gain, vision loss, decreased hearing, hoarseness, chest pain, syncope, dyspnea on exertion,  peripheral edema, prolonged cough, headaches, hemoptysis, abdominal pain, melena, hematochezia, severe indigestion/heartburn, hematuria, incontinence, genital sores, muscle weakness, suspicious skin lesions, transient blindness, difficulty walking, depression, unusual weight change, abnormal bleeding, enlarged lymph nodes, angioedema, and breast masses.    Physical Exam  General:  healthy appearing.  well-developed.   Head:  normocephalic.   Eyes:  pupils equal and pupils round.   Ears:  R ear normal and L ear normal.   Nose:  no external deformity and no nasal discharge.   Mouth:  pharyngeal erythema and posterior lymphoid hypertrophy.   Neck:  No deformities, masses, or tenderness noted. Lungs:  Normal respiratory effort, chest expands symmetrically. Lungs are clear to auscultation, no crackles or wheezes. Heart:  Normal rate and regular rhythm. S1 and S2 normal without gallop, murmur, click, rub or other extra sounds. Abdomen:  Bowel sounds positive,abdomen soft and non-tender without masses, organomegaly or hernias noted. Msk:  no joint swelling and no redness over joints.   Neurologic:  alert & oriented X3, decreased sensation to PP, and decreased sensation to LT.    Diabetes Management Exam:    Foot Exam (with socks and/or shoes not present):  Sensory-Pinprick/Light touch:          Left medial foot (L-4): diminished          Left dorsal foot (L-5): diminished          Left lateral foot (S-1): diminished          Right medial foot (L-4): diminished          Right dorsal foot (L-5): diminished          Right lateral foot (S-1): diminished       Inspection:          Left foot: normal          Right foot: normal    Eye Exam:       Eye Exam done elsewhere          Date: 01/02/2010          Results: normal          Done by: hollander   Impression & Recommendations:  Problem # 1:  ANEMIA-NOS (ICD-285.9)  Her updated medication list for this problem includes:     Cyanocobalamin 1000 Mcg/ml Soln (Cyanocobalamin) ..... One cc im evey 3 weeks    D 1000 Plus Tabs (Fa-cyanocobalamin-b6-d-ca) .Marland Kitchen... 1 two times a day    Ferrous Sulfate 325 (65 Fe) Mg Tbec (Ferrous sulfate) .Marland Kitchen... 1 once daily  Hgb: 11.3 (09/05/2009)   Hct: 32.5 (09/05/2009)   Platelets: 260.0 (09/05/2009) RBC: 3.61 (09/05/2009)   RDW: 13.9 (09/05/2009)   WBC: 6.0 (09/05/2009) MCV: 90.1 (09/05/2009)   MCHC: 34.8 (09/05/2009) Iron: 91 (10/04/2008)   B12: 501 (09/05/2009)   Folate: 10.9 (09/05/2009)   TSH: 2.86 (09/05/2009)  Problem # 2:  HYPERLIPIDEMIA (ICD-272.4)  Her updated medication list for this problem includes:    Niacin 500 Mg Tbcr (Niacin) .Marland Kitchen... Take 1 tablet by mouth once a day    Colestid 5 Gm Gran (Colestipol hcl) .Marland Kitchen... 5gm in water daily ( Rhymes have flavors if she desires) in am as directed  Lipid Goals: Chol Goal: 200 (05/17/2008)   HDL Goal: 40 (05/17/2008)   LDL Goal: 100 (05/17/2008)   TG Goal: 150 (05/17/2008)  Prior 10 Yr Risk Heart Disease: Not enough information (03/23/2007)   HDL:42.10 (09/05/2009)  Chol:151 (09/05/2009)  Problem # 3:  DIABETES MELLITUS, TYPE II (ICD-250.00) poor diet and refused the medications changes offerred by the rhematologist Her updated medication list for this problem includes:    Starlix 120 Mg Tabs (Nateglinide) .Marland Kitchen... Take 1 tablet by mouth three times a day    Glucophage 500 Mg Tabs (Metformin hcl) .Marland Kitchen... Take 2 by mouth bid    Tarka 2-180 Mg Tbcr (Trandolapril-verapamil hcl) .Marland Kitchen... 1 once daily  Labs Reviewed: Creat: 0.7 (09/05/2009)     Last Eye Exam: normal (01/02/2010) Reviewed HgBA1c results: 7.4 (09/05/2009)  7.0 (11/12/2008)  Orders: Diabetic Clinic Referral (Diabetic)  Complete Medication List: 1)  Verapamil Hcl 80 Mg Tabs (Verapamil hcl) .... Take 1 tablet by mouth two times a day 2)  Boniva Tabs (Ibandronate sodium tabs) .... Every month 3)  Starlix 120 Mg Tabs (Nateglinide) .... Take 1 tablet by mouth three times a  day 4)  Glucophage 500 Mg Tabs (Metformin hcl) .... Take 2 by mouth bid 5)  Calcium 600/vitamin D Tabs (Calcium carbonate-vitamin d tabs) .... Take 1 tablet by mouth two times a day 6)  Red Yeast Rice Extract Caps (Red yeast rice extract caps) .... Take 1 tablet by mouth once a day 7)  Niacin  500 Mg Tbcr (Niacin) .... Take 1 tablet by mouth once a day 8)  Patanol Soln (Olopatadine hcl soln) 9)  Lasix 20 Mg Tabs (Furosemide) .... Take 1 tablet by mouth once a day as needed 10)  Red Wine Extract Plus Caps (Misc natural products) 11)  Rhinocort Aqua 32 Mcg/act Susp (Budesonide (nasal)) 12)  Allegra-d 12 Hour Tb12 (Fexofenadine-pseudoephedrine tb12) 13)  Alprazolam Tabs (Alprazolam tabs) 14)  Darvocet-n 100 Tabs (Propoxyphene n-apap tabs) 15)  Cyanocobalamin 1000 Mcg/ml Soln (Cyanocobalamin) .... One cc im evey 3 weeks 16)  Co Q-10 Plus Red Yeast Rice 60-600 Mg Caps (Coenzyme q10-red yeast rice) .Marland Kitchen.. 1 once daily 17)  Red Wine Extract Plus Caps (Misc natural products) .... Once daily 18)  Alprazolam 0.25 Mg Tabs (Alprazolam) .... As needed 19)  Azelastine Hcl 137 Mcg/spray Soln (Azelastine hcl) .... Two spray in each nostril two times a day 20)  Tarka 2-180 Mg Tbcr (Trandolapril-verapamil hcl) .Marland Kitchen.. 1 once daily 21)  Zantac 150 Maximum Strength 150 Mg Tabs (Ranitidine hcl) .... Otc at the drug stor one a night for at least 2 weeks then as needed  when you feel bloated 22)  Solaraze 3 % Gel (Diclofenac sodium) .... Apply twicedaily to area 23)  Allegra 60 Mg Tabs (Fexofenadine hcl) .Marland Kitchen.. 1 two times a day as needed for allergy 24)  Qvar 80 Mcg/act Aers (Beclomethasone dipropionate) .... 2 puffs and rinse, twice daily 25)  Furosemide 20 Mg Tabs (Furosemide) .Marland Kitchen.. 1 once daily as needed 26)  Krill Oil 1000 Mg Caps (Krill oil) .... Two by mouth two times a day 27)  Calcium 600/vitamin D 600-400 Mg-unit Chew (Calcium carbonate-vitamin d) .Marland Kitchen.. 1 two times a day 28)  D 1000 Plus Tabs  (Fa-cyanocobalamin-b6-d-ca) .Marland Kitchen.. 1 two times a day 29)  Benzonatate 100 Mg Caps (Benzonatate) .... One by mouth q 6 hours as needed cough 30)  Ferrous Sulfate 325 (65 Fe) Mg Tbec (Ferrous sulfate) .Marland Kitchen.. 1 once daily 31)  Nasonex 50 Mcg/act Susp (Mometasone furoate) .... Two sprays in the nostril daily 32)  Colestid 5 Gm Gran (Colestipol hcl) .... 5gm in water daily ( Paiz have flavors if she desires) in am as directed  Patient Instructions: 1)  change the lovanza 2)  to kril oil ( [facility] or [employer] or [employer]) 3)  Please schedule a follow-up appointment in 3 months. Prescriptions: AZELASTINE HCL 137 MCG/SPRAY SOLN (AZELASTINE HCL) two spray in each nostril two times a day  #1 vial x 11   Entered and Authorized by:   Stacie Glaze MD   Signed by:   Stacie Glaze MD on 12/05/2009   Method used:   Electronically to        [facility]  [address] [id number]* (retail)       [address]       [place], [place]  [address]       Ph: [phone] or [phone]       Fax: [phone]   RxID:   [id number]

## 2010-06-05 NOTE — Assessment & Plan Note (Signed)
Summary: 3 month rov/njr   Vital Signs:  Patient Profile:   77 Years Old Female Height:     68 inches Weight:      146 pounds Temp:     98.6 degrees F oral Pulse rate:   80 / minute Resp:     14 per minute BP sitting:   140 / 78  (left arm)  Vitals Entered By: Willy Eddy, LPN (March 21, 2008 10:59 AM)                 PCP:  Stacie Glaze MD  Chief Complaint:  roa.  History of Present Illness:  Follow-Up Visit      This is a 77 year old woman who presents for Follow-up visit.  The patient denies chest pain, palpitations, dizziness, syncope, low blood sugar symptoms, high blood sugar symptoms, edema, SOB, DOE, PND, and orthopnea.  Since the last visit the patient notes problems with medications.  The patient reports monitoring BP.  When questioned about possible medication side effects, the patient notes GI upset.  with possible diarrhea and cough  Diabetes Management History:      The patient is a 77 years old female who comes in for evaluation of DM Type 2.  She has not been enrolled in the "Diabetic Education Program".  She is checking home blood sugars.  She says that she is not exercising regularly.       Current Allergies: ! * DUST ! * SMOKE ! * IVP DYE ! DURICEF ! * SULPHUR ! PENICILLIN ! TEGRETOL ! * CINEBAC ! * NICOBID ! Lanetta Inch ! * RU-TUSS ! * ACTONEL ! * ATACAND ! NORVASC ! * MOBIC ! * BEXTRA ! Johnathan Hausen ! * PREVACHOL ! HYDROCODONE ! RELAFEN ! * NIASPAN ! VICODIN ! * ZEPHREX LA ! ASPIRIN ! SELDANE ! CODEINE ! VIOXX ! NAPROSYN ! * ZETIA ! CELEBREX ! * LOETREL ! * MICARDIS ! [facility] ! Jolene Provost ! * SULAR ! MORPHINE ! * OMNICEF ! Lanetta Inch ! * OPTIVAR EYE DROPS ! * CLONODINE ! VERAPAMIL ! * SOLUMDEROL ! * POLLEN ! * MOLD  Past Medical History:    Reviewed history from 12/16/2006 and no changes required:       Allergies       Diabetes mellitus, type II       Hypertension       Osteoarthritis       Palpatations  Allergic rhinitis       Anemia-NOS       Colonic polyps, hx of       GERD       Hyperlipidemia       Chronic Facial Pain       Acral Numbness       Low Vit B12 Levels       Upper Arm Knots       Dylipidemia       Chronic Foot Pain       Chronic Dry Cough       Obesity  Past Surgical History:    Reviewed history from 12/16/2006 and no changes required:       Colonoscopy       Small Adenomatous polyp removal   Family History:    Reviewed history from 12/16/2006 and no changes required:       Family History of Cardiovascular disorder       Family History Diabetes 1st degree relative  Family History of Colon CA 1st degree relative <60  Social History:    Reviewed history from 09/05/2007 and no changes required:       Retired       Single       Patient never smoked.      Physical Exam  General:     healthy appearing.   Eyes:     No corneal or conjunctival inflammation noted. EOMI. Perrla. Funduscopic exam benign, without hemorrhages, exudates or papilledema. Vision grossly normal. Nose:     nasal dischargemucosal pallor, mucosal edema, and airflow obstruction.   Mouth:     pharyngeal erythema and posterior lymphoid hypertrophy.   Neck:     No deformities, masses, or tenderness noted. Lungs:     Normal respiratory effort, chest expands symmetrically. Lungs are clear to auscultation, no crackles or wheezes. Heart:     Normal rate and regular rhythm. S1 and S2 normal without gallop, murmur, click, rub or other extra sounds. Abdomen:     Bowel sounds positive,abdomen soft and non-tender without masses, organomegaly or hernias noted.    Impression & Recommendations:  Problem # 1:  COUGH (ICD-786.2) the Jolene Provost has been assessed as the possible cause but the qvar has helped the problem is trhat the pt has been on a host of blood pressure medicatons that have not been tolerated  Problem # 2:  IRRITABLE BOWEL SYNDROME (ICD-564.1) this Mcdill be the underlying  situation, pt has been consistant with the verapmil/tarka since september trial of levsin sl as needed diarrhea to see if this is more IBS  Problem # 3:  HYPERTENSION (ICD-401.9)  Her updated medication list for this problem includes:    Tarka 2-180 Mg Tbcr (Trandolapril-verapamil hcl) .Marland Kitchen... 1 once daily    Furosemide 20 Mg Tabs (Furosemide) .Marland Kitchen... 1 once daily as needed  BP today: 140/78 Prior BP: 122/66 (03/06/2008)  Prior 10 Yr Risk Heart Disease: Not enough information (03/23/2007)  Labs Reviewed: Creat: 0.9 (09/28/2007)   Problem # 4:  DIABETES MELLITUS, TYPE II (ICD-250.00)  Her updated medication list for this problem includes:    Starlix 120 Mg Tabs (Nateglinide) .Marland Kitchen... Tid    Glucophage 500 Mg Tabs (Metformin hcl) .Marland Kitchen... Tid    Tarka 2-180 Mg Tbcr (Trandolapril-verapamil hcl) .Marland Kitchen... 1 once daily  Labs Reviewed: HgBA1c: 7.2 (09/28/2007)   Creat: 0.9 (09/28/2007)     see Dr ALtimer's note for DM control   Problem # 5:  EUSTACHIAN TUBE DYSFUNCTION, RIGHT (ICD-381.81) trilsa of astipro .15 dialu  Complete Medication List: 1)  Cyanocobalamin 1000 Mcg/ml Soln (Cyanocobalamin) .... One cc im every month 2)  Starlix 120 Mg Tabs (Nateglinide) .... Tid 3)  Glucophage 500 Mg Tabs (Metformin hcl) .... Tid 4)  Co Q-10 Plus Red Yeast Rice 60-600 Mg Caps (Coenzyme q10-red yeast rice) .Marland Kitchen.. 1 once daily 5)  Red Wine Extract Plus Caps (Misc natural products) .... Once daily 6)  Alprazolam 0.25 Mg Tabs (Alprazolam) .... As needed 7)  Boniva 150 Mg Tabs (Ibandronate sodium) .... One by mouth monthy 8)  Astelin 137 Mcg/spray Soln (Azelastine hcl) .... Two sprays q nare bid 9)  Tarka 2-180 Mg Tbcr (Trandolapril-verapamil hcl) .Marland Kitchen.. 1 once daily 10)  Zantac 150 Maximum Strength 150 Mg Tabs (Ranitidine hcl) .... Otc at the drug stor one a night for at least 2 weeks then as needed  when you feel bloated 11)  Solaraze 3 % Gel (Diclofenac sodium) .... Apply twicedaily to area 12)  Allegra 60 Mg  Tabs (Fexofenadine hcl) .Marland Kitchen.. 1 two times a day as needed for allergy 13)  Qvar 80 Mcg/act Aers (Beclomethasone dipropionate) .... 2 puffs and rinse, twice daily 14)  Furosemide 20 Mg Tabs (Furosemide) .Marland Kitchen.. 1 once daily as needed 15)  Levsin/sl 0.125 Mg Subl (Hyoscyamine sulfate) .... One by mouth q 2-4 hour when has diarrhea  Diabetes Management Assessment/Plan:      The following lipid goals have been established for the patient: Total cholesterol goal of 200; LDL cholesterol goal of 100; HDL cholesterol goal of 40; Triglyceride goal of 200.  Her blood pressure goal is < 130/80.     Patient Instructions: 1)  Please schedule a follow-up appointment in 2 months.   Prescriptions: LEVSIN/SL 0.125 MG SUBL (HYOSCYAMINE SULFATE) one by mouth q 2-4 hour when has diarrhea  #30 x 11   Entered and Authorized by:   Stacie Glaze MD   Signed by:   Stacie Glaze MD on 03/21/2008   Method used:   Print then Give to Patient   RxID:   [id number] FUROSEMIDE 20 MG TABS (FUROSEMIDE) 1 once daily as needed  #90 x 3   Entered by:   Willy Eddy, LPN   Authorized by:   Stacie Glaze MD   Signed by:   Willy Eddy, LPN on 14/78/2956   Method used:   Print then Give to Patient   RxID:   [id number] ALPRAZOLAM 0.25 MG  TABS (ALPRAZOLAM) as needed  #180 x 3   Entered by:   Willy Eddy, LPN   Authorized by:   Stacie Glaze MD   Signed by:   Willy Eddy, LPN on 28/41/3244   Method used:   Print then Give to Patient   RxID:   [id number]  ]

## 2010-06-05 NOTE — Assessment & Plan Note (Signed)
Summary: ROV/MBW   Primary Provider/Referring Provider:  Stacie Glaze MD  CC:  6 month followup.  Pt c/o drainage and cough x 1 month.  Cough is prod with clear sputum.  Marland Kitchen  History of Present Illness: Current Problems:  ACUTE MAXILLARY SINUSITIS (ICD-461.0) EUSTACHIAN TUBE DYSFUNCTION, RIGHT (ICD-381.81) IRRITABLE BOWEL SYNDROME (ICD-564.1) COUGH (ICD-786.2) FAMILY HISTORY OF COLON CA 1ST DEGREE RELATIVE <60 (ICD-V16.0) FAMILY HISTORY DIABETES 1ST DEGREE RELATIVE (ICD-V18.0) HYPERLIPIDEMIA (ICD-272.4) GERD (ICD-530.81) COLONIC POLYPS, HX OF (ICD-V12.72) ANEMIA-NOS (ICD-285.9) ALLERGIC RHINITIS (ICD-477.9) OSTEOARTHRITIS (ICD-715.90) HYPERTENSION (ICD-401.9) DIABETES MELLITUS, TYPE II (ICD-250.00)  From 09/05/07- Mrs. Papin returns for scheduled follow-up.  She says the pollen now is causing increased postnasal drip, which makes her nauseated.  She has been using Allegra D., but it raises her blood pressure.  She has used erythromycin ointment on a sore in her nose.  She likes Astelin nasal spray.  She had been on allergy vaccine for 10 years and quit.  11.3.09- Allergic rhinitis, multiple environmental sensitivities Has not had recent problems, except truck fumes make maxillary areas hurt. Dry cough intermittent ande occasional, withut wheeze. Benzonatate doesn't help. Recognizes dry cough with ACEI- Tarka- which we discussed. Says it is annoying, but it was hard to find effective BP med she could tolerate.  09/05/08- Allergic rhinitis, multiple environmental sensitivities Distraught- dog just died, now alone.  Difficult spring pollen season, esp in March. Now persstent dry cough. Right maxillary area soreness . Right ear pressure discomfort. Astepro helps but too sweet. Denies wheeze, purulent or bloody discharge .     Current Medications (verified): 1)  Cyanocobalamin 1000 Mcg/ml Soln (Cyanocobalamin) .... One Cc Im Evey 3 Weeks 2)  Starlix 120 Mg  Tabs (Nateglinide) .... Tid 3)   Glucophage 500 Mg  Tabs (Metformin Hcl) .... Tid 4)  Co Q-10 Plus Red Yeast Rice 60-600 Mg  Caps (Coenzyme Q10-Red Yeast Rice) .Marland Kitchen.. 1 Once Daily 5)  Red Wine Extract Plus   Caps (Misc Natural Products) .... Once Daily 6)  Alprazolam 0.25 Mg  Tabs (Alprazolam) .... As Needed 7)  Boniva 150 Mg  Tabs (Ibandronate Sodium) .... One By Mouth Monthy 8)  Astelin 137 Mcg/spray  Soln (Azelastine Hcl) .... Two Sprays Q Nare Bid 9)  Tarka 2-180 Mg  Tbcr (Trandolapril-Verapamil Hcl) .Marland Kitchen.. 1 Once Daily 10)  Zantac 150 Maximum Strength 150 Mg  Tabs (Ranitidine Hcl) .... Otc At The Drug Stor One A Night For At Least 2 Weeks Then As Needed  When You Feel Bloated 11)  Solaraze 3 %  Gel (Diclofenac Sodium) .... Apply Twicedaily To Area 12)  Allegra 60 Mg  Tabs (Fexofenadine Hcl) .Marland Kitchen.. 1 Two Times A Day As Needed For Allergy 13)  Qvar 80 Mcg/act Aers (Beclomethasone Dipropionate) .... 2 Puffs and Rinse, Twice Daily 14)  Furosemide 20 Mg Tabs (Furosemide) .Marland Kitchen.. 1 Once Daily As Needed 15)  Lovaza 1 Gm Caps (Omega-3-Acid Ethyl Esters) .... 2 Two Times A Day 16)  Calcium 600/vitamin D 600-400 Mg-Unit Chew (Calcium Carbonate-Vitamin D) .Marland Kitchen.. 1 Two Times A Day 17)  D 1000 Plus  Tabs (Fa-Cyanocobalamin-B6-D-Ca) .Marland Kitchen.. 1 Two Times A Day 18)  K-Tabs 10 Meq Cr-Tabs (Potassium Chloride) .Marland Kitchen.. 1 Once Daily 19)  Astepro 137 Mcg/spray Soln (Azelastine Hcl) .... Use As Directed 20)  Benzonatate 100 Mg Caps (Benzonatate) .... One By Mouth Q 6 Hours As Needed Cough 21)  Ferrous Sulfate 325 (65 Fe) Mg Tbec (Ferrous Sulfate) .Marland Kitchen.. 1 Once Daily  Allergies (verified): 1)  ! *  Dust 2)  ! * Smoke 3)  ! * Ivp Dye 4)  ! Duricef 5)  ! * Sulphur 6)  ! Penicillin 7)  ! Tegretol 8)  ! * Cinebac 9)  ! * Nicobid 10)  ! * Questran 11)  ! * Ru-Tuss 12)  ! * Actonel 13)  ! * Atacand 14)  ! Norvasc 15)  ! * Mobic 16)  ! * Bextra 17)  ! * Ketek 18)  ! * Prevachol 19)  ! Hydrocodone 20)  ! Relafen 21)  ! * Niaspan 22)  ! Vicodin 23)  !  * Zephrex La 24)  ! Aspirin 25)  ! Seldane 26)  ! Codeine 27)  ! Vioxx 28)  ! Naprosyn 29)  ! * Zetia 30)  ! Celebrex 31)  ! * Loetrel 32)  ! * Micardis 33)  ! * Ziac 34)  ! * Tarka 35)  ! * Sular 36)  ! Morphine 37)  ! * Omnicef 38)  ! * Questran 39)  ! * Optivar Eye Drops 40)  ! * Clonodine 41)  ! Verapamil 42)  ! * Solumderol 43)  ! * Pollen 44)  ! * Mold  Past History:  Family History:    Family History of Cardiovascular disorder    Family History Diabetes 1st degree relative    Family History of Colon CA 1st degree relative <60     (12/16/2006)  Social History:    Retired    Single    Patient never smoked.      (09/05/2007)  Risk Factors:    Alcohol Use: N/A    >5 drinks/d w/in last 3 months: N/A    Caffeine Use: N/A    Diet: N/A    Exercise: no (03/23/2007)  Risk Factors:    Smoking Status: never (09/05/2007)    Packs/Day: N/A    Cigars/wk: N/A    Pipe Use/wk: N/A    Cans of tobacco/wk: N/A    Passive Smoke Exposure: N/A  Past medical, surgical, family and social histories (including risk factors) reviewed for relevance to current acute and chronic problems.  Past Medical History:    Reviewed history from 12/16/2006 and no changes required:    Allergies    Diabetes mellitus, type II    Hypertension    Osteoarthritis    Palpatations    Allergic rhinitis    Anemia-NOS    Colonic polyps, hx of    GERD    Hyperlipidemia    Chronic Facial Pain    Acral Numbness    Low Vit B12 Levels    Upper Arm Knots    Dylipidemia    Chronic Foot Pain    Chronic Dry Cough    Obesity  Past Surgical History:    Reviewed history from 12/16/2006 and no changes required:    Colonoscopy    Small Adenomatous polyp removal  Family History:    Reviewed history from 12/16/2006 and no changes required:       Family History of Cardiovascular disorder       Family History Diabetes 1st degree relative       Family History of Colon CA 1st degree relative  <60  Social History:    Reviewed history from 09/05/2007 and no changes required:       Retired       Single       Patient never smoked.   Review of Systems      See HPI  The patient complains of dyspnea on exertion.  The patient denies fever, weight loss, hoarseness, chest pain, syncope, peripheral edema, prolonged cough, headaches, hemoptysis, abdominal pain, and severe indigestion/heartburn.    Vital Signs:  Patient profile:   77 year old female Weight:      145 pounds BMI:     22.13 Pulse rate:   68 / minute BP sitting:   134 / 82  (left arm)  Vitals Entered By: Vernie Murders ([place]  5, 2010 10:18 AM)  O2 Sat on room air at rest %:  97  Physical Exam  Additional Exam:  General: A/Ox3; pleasant and cooperative, NAD, very thin still but looks brighter and stornger. SKIN: no rash, lesions NODES: no lymphadenopathy HEENT: [place]/AT, EOM- WNL, Conjuctivae- clear, PERRLA, TM-WNL, Nose- clear, Throat- clear and wnl NECK: Supple w/ fair ROM, JVD- none, normal carotid impulses w/o bruits Thyroid- CHEST: Clear to P&A, minor dry cough HEART: RRR, no m/g/r heard ABDOMEN:  FAO:ZHYQ, nl pulses, no edema  NEURO: Grossly intact to observation      Impression & Recommendations:  Problem # 1:  ALLERGIC RHINITIS (ICD-477.9)  Miild rhinosinusisits - will try neb and depo 40. Her updated medication list for this problem includes:    Astelin 137 Mcg/spray Soln (Azelastine hcl) .Marland Kitchen..Marland Kitchen Two sprays q nare bid    Allegra 60 Mg Tabs (Fexofenadine hcl) .Marland Kitchen... 1 two times a day as needed for allergy    Astepro 137 Mcg/spray Soln (Azelastine hcl) ..... Use as directed  Problem # 2:  COUGH (ICD-786.2)  Chronic cyclical cough, with no wheeze rhonchii or dullness.  Medications Added to Medication List This Visit: 1)  Ferrous Sulfate 325 (65 Fe) Mg Tbec (Ferrous sulfate) .Marland Kitchen.. 1 once daily  Other Orders: Est. Patient Level III (65784) Admin of Therapeutic Inj  intramuscular or subcutaneous  (69629) Depo- Medrol 40mg  (J1030) Nebulizer Tx (52841)  Patient Instructions: 1)  Please schedule a follow-up appointment in 6 months. 2)  neb nasal neo 3)  depo 40 4)  Try otc Delsym for cough- you can take it with the benzonatate if needed.   Medication Administration  Injection # 1:    Medication: Depo- Medrol 40mg     Diagnosis: ALLERGIC RHINITIS (ICD-477.9)    Route: SQ    Site: LUOQ gluteus    Exp Date: 07/2009    Lot #: [id number]    Mfr: Teva    Patient tolerated injection without complications    Given by: Reynaldo Minium CMA ([place]  5, 2010 11:07 AM)  Medication # 1:    Medication: EMR miscellaneous medications    Diagnosis: ALLERGIC RHINITIS (ICD-477.9)    Dose: 3 drops    Route: intranasal    Exp Date: 10/2009    Lot #: [id number]    Mfr: Bayer    Comments: Neo-Synephrine    Patient tolerated medication without complications    Given by: Reynaldo Minium CMA ([place]  5, 2010 11:08 AM)  Orders Added: 1)  Est. Patient Level III [40347] 2)  Admin of Therapeutic Inj  intramuscular or subcutaneous [96372] 3)  Depo- Medrol 40mg  [J1030] 4)  Nebulizer Tx [42595]

## 2010-06-05 NOTE — Progress Notes (Signed)
Summary: RESULTS   Phone Note Call from Patient Call back at Home Phone [phone]   Caller: Patient-VM Call For: DR Lennon Alstrom Reason for Call: Lab or Test Results Summary of Call: WOULD LIKE LAB RESULTS FROM TUESDAY. Initial call taken by: Warnell Forester,  May 06, 2007 3:53 PM  Follow-up for Phone Call        kidney function was good but sugar was high... schedule a A1C 250.00 Follow-up by: Stacie Glaze MD,  May 08, 2007 6:59 PM  Additional Follow-up for Phone Call Additional follow up Details #1::        Spoke to pt. and she has her Hgba1c results from another MD on 04/18/2007 .Marland Kitchen... HGBA1C: ...7.1 Additional Follow-up by: Lynann Beaver CMA,  May 09, 2007 8:33 AM

## 2010-06-05 NOTE — Assessment & Plan Note (Signed)
Summary: 3 month rov/njr   Vital Signs:  Patient profile:   77 year old female Height:      68 inches Weight:      146 pounds BMI:     22.28 Temp:     98.2 degrees F oral Pulse rate:   72 / minute Resp:     14 per minute BP sitting:   140 / 80  (left arm)  Vitals Entered By: Willy Eddy, LPN (July 11, 2009 10:50 AM) CC: roa, Hypertension Management   Primary Care Provider:  Stacie Glaze MD  CC:  roa and Hypertension Management.  History of Present Illness: Breathing is stable asthma blood presure has been  the diarrhea is persistant and been problem  pt has dumping syndrome and possibly related to the absense of the gal bladder DM  is stable  Hypertension History:      She denies headache, chest pain, palpitations, dyspnea with exertion, orthopnea, PND, peripheral edema, visual symptoms, neurologic problems, syncope, and side effects from treatment.        Positive major cardiovascular risk factors include female age 82 years old or older, diabetes, hyperlipidemia, and hypertension.  Negative major cardiovascular risk factors include non-tobacco-user status.     Preventive Screening-Counseling & Management  Alcohol-Tobacco     Smoking Status: never  Current Problems (verified): 1)  Asthma  (ICD-493.90) 2)  Acute Maxillary Sinusitis  (ICD-461.0) 3)  Eustachian Tube Dysfunction, Right  (ICD-381.81) 4)  Irritable Bowel Syndrome  (ICD-564.1) 5)  Cough  (ICD-786.2) 6)  Family History of Colon Ca 1st Degree Relative <60  (ICD-V16.0) 7)  Family History Diabetes 1st Degree Relative  (ICD-V18.0) 8)  Hyperlipidemia  (ICD-272.4) 9)  Gerd  (ICD-530.81) 10)  Colonic Polyps, Hx of  (ICD-V12.72) 11)  Anemia-nos  (ICD-285.9) 12)  Allergic Rhinitis  (ICD-477.9) 13)  Osteoarthritis  (ICD-715.90) 14)  Hypertension  (ICD-401.9) 15)  Diabetes Mellitus, Type II  (ICD-250.00)  Current Medications (verified): 1)  Cyanocobalamin 1000 Mcg/ml Soln (Cyanocobalamin) .... One Cc  Im Evey 3 Weeks 2)  Starlix 120 Mg  Tabs (Nateglinide) .... Tid 3)  Glucophage 500 Mg  Tabs (Metformin Hcl) .... Tid 4)  Co Q-10 Plus Red Yeast Rice 60-600 Mg  Caps (Coenzyme Q10-Red Yeast Rice) .Marland Kitchen.. 1 Once Daily 5)  Red Wine Extract Plus   Caps (Misc Natural Products) .... Once Daily 6)  Alprazolam 0.25 Mg  Tabs (Alprazolam) .... As Needed 7)  Boniva 150 Mg  Tabs (Ibandronate Sodium) .... One By Mouth Monthy 8)  Astelin 137 Mcg/spray  Soln (Azelastine Hcl) .Marland Kitchen.. 1-2 Puffs Each Nostril Up To Twice Daily If Needed 9)  Tarka 2-180 Mg  Tbcr (Trandolapril-Verapamil Hcl) .Marland Kitchen.. 1 Once Daily 10)  Zantac 150 Maximum Strength 150 Mg  Tabs (Ranitidine Hcl) .... Otc At The Drug Stor One A Night For At Least 2 Weeks Then As Needed  When You Feel Bloated 11)  Solaraze 3 %  Gel (Diclofenac Sodium) .... Apply Twicedaily To Area 12)  Allegra 60 Mg  Tabs (Fexofenadine Hcl) .Marland Kitchen.. 1 Two Times A Day As Needed For Allergy 13)  Qvar 80 Mcg/act Aers (Beclomethasone Dipropionate) .... 2 Puffs and Rinse, Twice Daily 14)  Furosemide 20 Mg Tabs (Furosemide) .Marland Kitchen.. 1 Once Daily As Needed 15)  Lovaza 1 Gm Caps (Omega-3-Acid Ethyl Esters) .Marland Kitchen.. 1 Two Times A Day 16)  Calcium 600/vitamin D 600-400 Mg-Unit Chew (Calcium Carbonate-Vitamin D) .Marland Kitchen.. 1 Two Times A Day 17)  D 1000 Plus  Tabs (Fa-Cyanocobalamin-B6-D-Ca) .Marland Kitchen.. 1 Two Times A Day 18)  Benzonatate 100 Mg Caps (Benzonatate) .... One By Mouth Q 6 Hours As Needed Cough 19)  Ferrous Sulfate 325 (65 Fe) Mg Tbec (Ferrous Sulfate) .... One By Mouth Three Times A Week 20)  Nasonex 50 Mcg/act Susp (Mometasone Furoate) .... Two Sprays in The Nostril Daily  Allergies (verified): 1)  ! * Dust 2)  ! * Smoke 3)  ! * Ivp Dye 4)  ! Duricef 5)  ! * Sulphur 6)  ! Penicillin 7)  ! Tegretol 8)  ! * Cinebac 9)  ! * Nicobid 10)  ! * Questran 11)  ! * Ru-Tuss 12)  ! * Actonel 13)  ! * Atacand 14)  ! Norvasc 15)  ! * Mobic 16)  ! * Bextra 17)  ! * Ketek 18)  ! * Prevachol 19)  !  Hydrocodone 20)  ! Relafen 21)  ! * Niaspan 22)  ! Vicodin 23)  ! * Zephrex La 24)  ! Aspirin 25)  ! Seldane 26)  ! Codeine 27)  ! Vioxx 28)  ! Naprosyn 29)  ! * Zetia 30)  ! Celebrex 31)  ! * Loetrel 32)  ! * Micardis 33)  ! * Ziac 34)  ! * Tarka 35)  ! * Sular 36)  ! Morphine 37)  ! * Omnicef 38)  ! * Questran 39)  ! * Optivar Eye Drops 40)  ! * Clonodine 41)  ! Verapamil 42)  ! * Solumderol 43)  ! * Pneumonia 44)  ! * Pollen 45)  ! * Mold  Past History:  Family History: Last updated: 12/16/2006 Family History of Cardiovascular disorder Family History Diabetes 1st degree relative Family History of Colon CA 1st degree relative <60  Social History: Last updated: 09/05/2007 Retired Single Patient never smoked.   Risk Factors: Exercise: no (03/23/2007)  Risk Factors: Smoking Status: never (07/11/2009)  Past medical, surgical, family and social histories (including risk factors) reviewed, and no changes noted (except as noted below).  Past Medical History: Reviewed history from 12/16/2006 and no changes required. Allergies Diabetes mellitus, type II Hypertension Osteoarthritis Palpatations Allergic rhinitis Anemia-NOS Colonic polyps, hx of GERD Hyperlipidemia Chronic Facial Pain Acral Numbness Low Vit B12 Levels Upper Arm Knots Dylipidemia Chronic Foot Pain Chronic Dry Cough Obesity  Past Surgical History: Reviewed history from 12/16/2006 and no changes required. Colonoscopy Small Adenomatous polyp removal  Family History: Reviewed history from 12/16/2006 and no changes required. Family History of Cardiovascular disorder Family History Diabetes 1st degree relative Family History of Colon CA 1st degree relative <60  Social History: Reviewed history from 09/05/2007 and no changes required. Retired Single Patient never smoked.   Review of Systems  The patient denies anorexia, fever, weight loss, weight gain, vision loss, decreased  hearing, hoarseness, chest pain, syncope, dyspnea on exertion, peripheral edema, prolonged cough, headaches, hemoptysis, abdominal pain, melena, hematochezia, severe indigestion/heartburn, hematuria, incontinence, genital sores, muscle weakness, suspicious skin lesions, transient blindness, difficulty walking, depression, unusual weight change, abnormal bleeding, enlarged lymph nodes, angioedema, and breast masses.    Physical Exam  General:  healthy appearing.  well-developed.   Head:  normocephalic.   Eyes:  pupils equal and pupils round.   Nose:  nasal dischargemucosal pallor, mucosal edema, and airflow obstruction.   Mouth:  pharyngeal erythema and posterior lymphoid hypertrophy.   Neck:  No deformities, masses, or tenderness noted. Lungs:  Normal respiratory effort, chest expands symmetrically. Lungs are clear to auscultation,  no crackles or wheezes. Heart:  Normal rate and regular rhythm. S1 and S2 normal without gallop, murmur, click, rub or other extra sounds. Abdomen:  Bowel sounds positive,abdomen soft and non-tender without masses, organomegaly or hernias noted.   Impression & Recommendations:  Problem # 1:  HYPERLIPIDEMIA (ICD-272.4)  Her updated medication list for this problem includes:    Lovaza 1 Gm Caps (Omega-3-acid ethyl esters) .Marland Kitchen... 1 two times a day    Colestid 5 Gm Gran (Colestipol hcl) .Marland Kitchen... 5gm in water daily ( Zmuda have flavors if she desires) in am as directed  Lipid Goals: Chol Goal: 200 (05/17/2008)   HDL Goal: 40 (05/17/2008)   LDL Goal: 100 (05/17/2008)   TG Goal: 150 (05/17/2008)  Prior 10 Yr Risk Heart Disease: Not enough information (03/23/2007)  Problem # 2:  DUMPING SYNDROME (ICD-564.2) colestid daily trial for bile induced dumping  Problem # 3:  HYPERTENSION (ICD-401.9) Assessment: Unchanged  Her updated medication list for this problem includes:    Tarka 2-180 Mg Tbcr (Trandolapril-verapamil hcl) .Marland Kitchen... 1 once daily    Furosemide 20 Mg Tabs  (Furosemide) .Marland Kitchen... 1 once daily as needed  BP today: 140/80 Prior BP: 132/78 (04/11/2009)  Prior 10 Yr Risk Heart Disease: Not enough information (03/23/2007)  Labs Reviewed: K+: 3.9 (09/28/2007) Creat: : 0.9 (09/28/2007)     Problem # 4:  TENSION TYPE HEADACHE UNSPECIFIED (ICD-339.10) eye strain  Problem # 5:  IRRITABLE BOWEL SYNDROME (ICD-564.1)  Problem # 6:  DIABETES MELLITUS, TYPE II (ICD-250.00) per the diabetologist Her updated medication list for this problem includes:    Starlix 120 Mg Tabs (Nateglinide) .Marland Kitchen... Tid    Glucophage 500 Mg Tabs (Metformin hcl) .Marland Kitchen... Tid    Tarka 2-180 Mg Tbcr (Trandolapril-verapamil hcl) .Marland Kitchen... 1 once daily  Complete Medication List: 1)  Cyanocobalamin 1000 Mcg/ml Soln (Cyanocobalamin) .... One cc im evey 3 weeks 2)  Starlix 120 Mg Tabs (Nateglinide) .... Tid 3)  Glucophage 500 Mg Tabs (Metformin hcl) .... Tid 4)  Co Q-10 Plus Red Yeast Rice 60-600 Mg Caps (Coenzyme q10-red yeast rice) .Marland Kitchen.. 1 once daily 5)  Red Wine Extract Plus Caps (Misc natural products) .... Once daily 6)  Alprazolam 0.25 Mg Tabs (Alprazolam) .... As needed 7)  Boniva 150 Mg Tabs (Ibandronate sodium) .... One by mouth monthy 8)  Astelin 137 Mcg/spray Soln (Azelastine hcl) .Marland Kitchen.. 1-2 puffs each nostril up to twice daily if needed 9)  Tarka 2-180 Mg Tbcr (Trandolapril-verapamil hcl) .Marland Kitchen.. 1 once daily 10)  Zantac 150 Maximum Strength 150 Mg Tabs (Ranitidine hcl) .... Otc at the drug stor one a night for at least 2 weeks then as needed  when you feel bloated 11)  Solaraze 3 % Gel (Diclofenac sodium) .... Apply twicedaily to area 12)  Allegra 60 Mg Tabs (Fexofenadine hcl) .Marland Kitchen.. 1 two times a day as needed for allergy 13)  Qvar 80 Mcg/act Aers (Beclomethasone dipropionate) .... 2 puffs and rinse, twice daily 14)  Furosemide 20 Mg Tabs (Furosemide) .Marland Kitchen.. 1 once daily as needed 15)  Lovaza 1 Gm Caps (Omega-3-acid ethyl esters) .Marland Kitchen.. 1 two times a day 16)  Calcium 600/vitamin D  600-400 Mg-unit Chew (Calcium carbonate-vitamin d) .Marland Kitchen.. 1 two times a day 17)  D 1000 Plus Tabs (Fa-cyanocobalamin-b6-d-ca) .Marland Kitchen.. 1 two times a day 18)  Benzonatate 100 Mg Caps (Benzonatate) .... One by mouth q 6 hours as needed cough 19)  Ferrous Sulfate 325 (65 Fe) Mg Tbec (Ferrous sulfate) .... One by mouth three times  a week 20)  Nasonex 50 Mcg/act Susp (Mometasone furoate) .... Two sprays in the nostril daily 21)  Colestid 5 Gm Gran (Colestipol hcl) .... 5gm in water daily ( [place] have flavors if she desires) in am as directed  Hypertension Assessment/Plan:      The patient's hypertensive risk group is category C: Target organ damage and/or diabetes.  Today's blood pressure is 140/80.  Her blood pressure goal is < 130/80.  Patient Instructions: 1)  Please schedule a follow-up appointment in 2 months. 2)  take the colestid 5 gm daily to stop the diarrhea, and you Jarosz stop the fish oil ( lovaza) Prescriptions: COLESTID 5 GM GRAN (COLESTIPOL HCL) 5gm in water daily ( Brensinger have flavors if she desires) in AM as directed  #30 day sup. x 11   Entered and Authorized by:   Stacie Glaze MD   Signed by:   Stacie Glaze MD on 07/11/2009   Method used:   Electronically to        [facility]  [address] [id number]* (retail)       [address]       [place], [place]  [address]       Ph: [phone] or [phone]       Fax: [phone]   RxID:   [id number]   Appended Document: Orders Update     Clinical Lists Changes  Orders: Added new Service order of Prescription Created Electronically [phone]) - Signed

## 2010-06-05 NOTE — Assessment & Plan Note (Signed)
Summary: 3 MONTH ROV/NJR   Vital Signs:  Patient Profile:   77 Years Old Female Height:     68 inches Weight:      148 pounds Temp:     98.2 degrees F oral Pulse rate:   80 / minute Resp:     14 per minute BP sitting:   140 / 80  (left arm)  Vitals Entered By: Willy Eddy, LPN (Down 27, 2009 10:52 AM)                 PCP:  Stacie Glaze MD  Chief Complaint:  roa.  History of Present Illness:  Follow-Up Visit      This is a 77 year old woman who presents for Follow-up visit.  severe OA in knee.  The patient complains of palpitations.  Since the last visit the patient notes problems with medications and being seen by a specialist.  The patient reports taking meds as prescribed, monitoring BP, and dietary compliance.  When questioned about possible medication side effects, the patient notes cramping and muscle aches.      Current Allergies: ! * DUST ! * SMOKE ! * IVP DYE ! DURICEF ! * SULPHUR ! PENICILLIN ! TEGRETOL ! * CINEBAC ! * NICOBID ! Lanetta Inch ! * RU-TUSS ! * ACTONEL ! * ATACAND ! NORVASC ! * MOBIC ! * BEXTRA ! Johnathan Hausen ! * PREVACHOL ! HYDROCODONE ! RELAFEN ! * NIASPAN ! VICODIN ! * ZEPHREX LA ! ASPIRIN ! SELDANE ! CODEINE ! VIOXX ! NAPROSYN ! * ZETIA ! CELEBREX ! * LOETREL ! * MICARDIS ! [facility] ! Jolene Provost ! * SULAR ! MORPHINE ! * OMNICEF ! Lanetta Inch ! * OPTIVAR EYE DROPS ! * CLONODINE ! VERAPAMIL ! * SOLUMDEROL ! * POLLEN ! * MOLD   Family History:    Reviewed history from 12/16/2006 and no changes required:       Family History of Cardiovascular disorder       Family History Diabetes 1st degree relative       Family History of Colon CA 1st degree relative <60  Social History:    Reviewed history from 09/05/2007 and no changes required:       Retired       Single       Patient never smoked.     Review of Systems  The patient denies anorexia, fever, weight loss, weight gain, vision loss, decreased hearing,  hoarseness, chest pain, syncope, dyspnea on exertion, peripheral edema, prolonged cough, headaches, hemoptysis, abdominal pain, melena, hematochezia, severe indigestion/heartburn, hematuria, incontinence, genital sores, muscle weakness, suspicious skin lesions, transient blindness, difficulty walking, depression, unusual weight change, abnormal bleeding, enlarged lymph nodes, and angioedema.     Physical Exam  General:     healthy appearing.   Eyes:     PERRLA/EOM intact; conjunctiva and sclera clear there is periorbital puffiness. Nose:     I don't see any irritation or folliculitis in her nostrils.  There is some clear watery secretion. Mouth:     clear Neck:     no JVD.   Lungs:     clear bilaterally to auscultation and percussion Heart:     regular rate and rhythm, S1, S2 without murmurs, rubs, gallops, or clicks Abdomen:     soft, non-tender, and bowel sounds hyperactive.   Msk:     decreased ROM, joint tenderness, and joint swelling.   Pulses:     R and  L carotid,radial,femoral,dorsalis pedis and posterior tibial pulses are full and equal bilaterally Neurologic:     No cranial nerve deficits noted. Station and gait are normal. Plantar reflexes are down-going bilaterally. DTRs are symmetrical throughout. Sensory, motor and coordinative functions appear intact.    Impression & Recommendations:  Problem # 1:  OSTEOARTHRITIS (ICD-715.90)  Her updated medication list for this problem includes:    Ultram Er 100 Mg Tb24 (Tramadol hcl) .Marland Kitchen... As needed\par Discussed use of medications, application of heat or cold, and exercises.   Problem # 2:  DIABETES MELLITUS, TYPE II (ICD-250.00)  Her updated medication list for this problem includes:    Starlix 120 Mg Tabs (Nateglinide) .Marland Kitchen... Tid    Glucophage 500 Mg Tabs (Metformin hcl) .Marland Kitchen... Tid    Tarka 2-180 Mg Tbcr (Trandolapril-verapamil hcl) .Marland Kitchen... 1/2 by mouth every other day y  Labs Reviewed: Creat: 0.9 (05/03/2007)      Orders: TLB-A1C / Hgb A1C (Glycohemoglobin) (83036-A1C)   Problem # 3:  GERD (ICD-530.81)  Her updated medication list for this problem includes:    Zantac 150 Maximum Strength 150 Mg Tabs (Ranitidine hcl) ..... Otc at the drug stor one a night for at least 2 weeks then as needed  when you feel bloated  Diagnostics Reviewed:  Discussed lifestyle modifications, diet, antacids/medications, and preventive measures. Handout provided.   Complete Medication List: 1)  Cyanocobalamin 1000 Mcg/ml Soln (Cyanocobalamin) .... One cc im every month 2)  Starlix 120 Mg Tabs (Nateglinide) .... Tid 3)  Glucophage 500 Mg Tabs (Metformin hcl) .... Tid 4)  Niacin Cr 500 Mg Tbcr (Niacin) .... Once daily 5)  Patanol 0.1 % Soln (Olopatadine hcl) .... As needed 6)  Red Wine Extract Plus Caps (Misc natural products) .... Once daily 7)  Alprazolam 0.25 Mg Tabs (Alprazolam) .... As needed 8)  Ultram Er 100 Mg Tb24 (Tramadol hcl) .... As needed\par 9)  Boniva 150 Mg Tabs (Ibandronate sodium) .... One by mouth monthy 10)  Astelin 137 Mcg/spray Soln (Azelastine hcl) .... Two sprays q nare bid 11)  Tarka 2-180 Mg Tbcr (Trandolapril-verapamil hcl) .... 1/2 by mouth every other day y 6)  Promethazine Hcl 25 Mg Tabs (Promethazine hcl) .... One by mouth q 4-6 hours 13)  Zantac 150 Maximum Strength 150 Mg Tabs (Ranitidine hcl) .... Otc at the drug stor one a night for at least 2 weeks then as needed  when you feel bloated 14)  Solaraze 3 % Gel (Diclofenac sodium) 15)  Allegra 60 Mg Tabs (Fexofenadine hcl) .Marland Kitchen.. 1 two times a day as needed for allergy 16)  Benzonatate 100 Mg Caps (Benzonatate) .Marland Kitchen.. 1 three times a day as needed cough  Other Orders: TLB-BMP (Basic Metabolic Panel-BMET) (80048-METABOL)   Patient Instructions: 1)  Please schedule a follow-up appointment in 3 months.   ]  Appended Document: Orders Update     Clinical Lists Changes  Observations: Added new observation of COMMENTS: [name] RMA  [place] 27, 2009 12:59 PM  (09/28/2007 12:58) Added new observation of PH URINE: 5.5  (09/28/2007 12:58) Added new observation of SPEC GR URIN: <1.005  (09/28/2007 12:58) Added new observation of APPEARANCE U: Clear  (09/28/2007 12:58) Added new observation of UA COLOR: yellow  (09/28/2007 12:58) Added new observation of WBC DIPSTK U: negative  (09/28/2007 12:58) Added new observation of NITRITE URN: negative  (09/28/2007 12:58) Added new observation of UROBILINOGEN: 0.2  (09/28/2007 12:58) Added new observation of PROTEIN, URN: negative  (09/28/2007 12:58) Added new observation of  BLOOD UR DIP: negative  (09/28/2007 12:58) Added new observation of KETONES URN: negative  (09/28/2007 12:58) Added new observation of BILIRUBIN UR: negative  (09/28/2007 12:58) Added new observation of GLUCOSE, URN: negative  (09/28/2007 12:58)       Laboratory Results   Urine Tests   Date/Time Reported: [place] 27, 2009 12:59 PM   Routine Urinalysis   Color: yellow Appearance: Clear Glucose: negative   (Normal Range: Negative) Bilirubin: negative   (Normal Range: Negative) Ketone: negative   (Normal Range: Negative) Spec. Gravity: <1.005   (Normal Range: 1.003-1.035) Blood: negative   (Normal Range: Negative) pH: 5.5   (Normal Range: 5.0-8.0) Protein: negative   (Normal Range: Negative) Urobilinogen: 0.2   (Normal Range: 0-1) Nitrite: negative   (Normal Range: Negative) Leukocyte Esterace: negative   (Normal Range: Negative)    Comments: [name] RMA  [place] 27, 2009 12:59 PM

## 2010-06-05 NOTE — Letter (Signed)
Summary: [facility] Endocrinology and Diabetes  [facility] Endocrinology and Diabetes   Imported By: Maryln Gottron 02/15/2009 10:59:45  _____________________________________________________________________  External Attachment:    Type:   Image     Comment:   External Document

## 2010-06-05 NOTE — Letter (Signed)
Summary: [facility] Endocrinology & Diabetes  [facility] Endocrinology & Diabetes   Imported By: Maryln Gottron 08/30/2009 10:35:56  _____________________________________________________________________  External Attachment:    Type:   Image     Comment:   External Document

## 2010-06-05 NOTE — Letter (Signed)
Summary: [facility] Endocrinology   Imported By: Maryln Gottron 08/10/2008 15:20:32  _____________________________________________________________________  External Attachment:    Type:   Image     Comment:   External Document

## 2010-06-05 NOTE — Assessment & Plan Note (Signed)
Summary: W6A/FUP/RCD   Vital Signs:  Patient Profile:   77 Years Old Female Height:     68 inches Weight:      148 pounds Temp:     98.2 degrees F oral Pulse rate:   80 / minute Resp:     14 per minute BP sitting:   150 / 84  (left arm)  Vitals Entered By: Willy Eddy, LPN (February 02, 2007 9:04 AM)                 Chief Complaint:  roa/c/o couldnt tolerate new bp med-didnt bring bp down. and so pt went back to tarka.  Hypertension History:      She denies headache, chest pain, palpitations, dyspnea with exertion, orthopnea, PND, peripheral edema, visual symptoms, neurologic problems, and syncope.  She notes the following problems with antihypertensive medication side effects: cough and diarrhea ( multiple BM from the verapamil and cought from the ace).        Positive major cardiovascular risk factors include female age 35 years old or older, diabetes, hyperlipidemia, and hypertension.     Current Allergies: ! * DUST ! * SMOKE ! * IVP DYE ! DURICEF ! * SULPHUR ! PENICILLIN ! TEGRETOL ! * CINEBAC ! * NICOBID ! Lanetta Inch ! * RU-TUSS ! * ACTONEL ! * ATACAND ! NORVASC ! * MOBIC ! * BEXTRA ! Johnathan Hausen ! * PREVACHOL ! HYDROCODONE ! RELAFEN ! * NIASPAN ! VICODIN ! * ZEPHREX LA ! ASPIRIN ! SELDANE ! CODEINE ! VIOXX ! NAPROSYN ! * ZETIA ! CELEBREX ! * LOETREL ! * MICARDIS ! [facility] ! Jolene Provost ! * SULAR ! MORPHINE ! * OMNICEF ! Lanetta Inch ! * OPTIVAR EYE DROPS ! * CLONODINE ! VERAPAMIL ! * SOLUMDEROL ! * POLLEN ! * MOLD  Past Medical History:    Reviewed history from 12/16/2006 and no changes required:       Allergies       Diabetes mellitus, type II       Hypertension       Osteoarthritis       Palpatations       Allergic rhinitis       Anemia-NOS       Colonic polyps, hx of       GERD       Hyperlipidemia       Chronic Facial Pain       Acral Numbness       Low Vit B12 Levels       Upper Arm Knots       Dylipidemia       Chronic  Foot Pain       Chronic Dry Cough       Obesity  Past Surgical History:    Reviewed history from 12/16/2006 and no changes required:       Colonoscopy       Small Adenomatous polyp removal      Physical Exam  General:     eldery wf in nad Nose:     no external deformity.   Mouth:     pharynx pink and moist.   Neck:     No deformities, masses, or tenderness noted. Lungs:     normal respiratory effort and no wheezes.   Heart:     normal rate and regular rhythm.   Msk:     normal ROM and no joint tenderness.      Impression &  Recommendations:  Problem # 1:  HYPERTENSION (ICD-401.9)   The following medications were removed from the medication list:    Tekturna 150 Mg Tabs (Aliskiren fumarate) ..... One a day  Her updated medication list for this problem includes:    Lasix 20 Mg Tabs (Furosemide) ..... Once daily     Tarka 2-180 Mg Tbcr (Trandolapril-verapamil hcl) .Marland Kitchen... 1/2 once daily  hold and try    Hydralazine Hcl 25 Mg Tabs (Hydralazine hcl) ..... One by mouth tid  BP today: 150/84 Prior BP: 120/70 (12/22/2006)  10 Yr Risk Heart Disease: Not enough information  The following medications were removed from the medication list:    Tekturna 150 Mg Tabs (Aliskiren fumarate) ..... One a day  Her updated medication list for this problem includes:    Lasix 20 Mg Tabs (Furosemide) ..... Once daily    Tarka 2-180 Mg Tbcr (Trandolapril-verapamil hcl) .Marland Kitchen... 1/2 once daily    Hydralazine Hcl 25 Mg Tabs (Hydralazine hcl) ..... One by mouth tid   Problem # 2:  DIABETES MELLITUS, TYPE II (ICD-250.00)  Her updated medication list for this problem includes:    Starlix 120 Mg Tabs (Nateglinide) .Marland Kitchen... Tid    Glucophage 500 Mg Tabs (Metformin hcl) .Marland Kitchen... Tid    Tarka 2-180 Mg Tbcr (Trandolapril-verapamil hcl) .Marland Kitchen... 1/2 once daily   Complete Medication List: 1)  Cyanocobalamin 1000 Mcg/ml Soln (Cyanocobalamin) .... Every month 2)  Starlix 120 Mg Tabs (Nateglinide) ....  Tid 3)  Glucophage 500 Mg Tabs (Metformin hcl) .... Tid 4)  Niacin Cr 500 Mg Tbcr (Niacin) .... Once daily 5)  Patanol 0.1 % Soln (Olopatadine hcl) .... As needed 6)  Lasix 20 Mg Tabs (Furosemide) .... Once daily 7)  Red Wine Extract Plus Caps (Misc natural products) .... Once daily 8)  Alprazolam 0.25 Mg Tabs (Alprazolam) .... As needed 9)  Darvocet-n 100 100-650 Mg Tabs (Propoxyphene n-apap) .... As needed 10)  Ultram Er 100 Mg Tb24 (Tramadol hcl) .... As needed\par 11)  Boniva 150 Mg Tabs (Ibandronate sodium) .... One by mouth monthy 12)  Astelin 137 Mcg/spray Soln (Azelastine hcl) .... Two sprays q nare bid 13)  Tarka 2-180 Mg Tbcr (Trandolapril-verapamil hcl) .... 1/2 once daily 14)  Hydralazine Hcl 25 Mg Tabs (Hydralazine hcl) .... One by mouth tid  Hypertension Assessment/Plan:      The patient's hypertensive risk group is category C: Target organ damage and/or diabetes.  Today's blood pressure is 150/84.  Her blood pressure goal is < 130/80.   Patient Instructions: 1)  Please schedule a follow-up appointment in1i month 2)  Hold the tarka and take the hydralazine start with one twice a day 3)  If BP does not come down use it three times a day.    Prescriptions: HYDRALAZINE HCL 25 MG  TABS (HYDRALAZINE HCL) one by mouth TID  #90 x 3   Entered and Authorized by:   Stacie Glaze MD   Signed by:   Stacie Glaze MD on 02/02/2007   Method used:   Print then Give to Patient   RxID:   [id number]  ]

## 2010-06-05 NOTE — Assessment & Plan Note (Signed)
Summary: f/u 6 months///kp   Primary Provider/Referring Provider:  Stacie Glaze MD  CC:  6 month followup.  Pt denies any compaints today.Marland Kitchen  History of Present Illness: From 09/05/07- [name] returns for scheduled follow-up.  She says the pollen now is causing increased postnasal drip, which makes her nauseated.  She has been using Allegra D., but it raises her blood pressure.  She has used erythromycin ointment on a sore in her nose.  She likes Astelin nasal spray.  She had been on allergy vaccine for 10 years and quit.  11.3.09- Allergic rhinitis, multiple environmental sensitivities Has not had recent problems, except truck fumes make maxillary areas hurt. Dry cough intermittent ande occasional, withut wheeze. Benzonatate doesn't help. Recognizes dry cough with ACEI- Tarka- which we discussed. Says it is annoying, but it was hard to find effective BP med she could tolerate.  09/05/08- Allergic rhinitis, multiple environmental sensitivities Distraught- dog just died, now alone.  Difficult spring pollen season, esp in March. Now persstent dry cough. Right maxillary area soreness . Right ear pressure discomfort. Astepro helps but too sweet. Denies wheeze, purulent or bloody discharge .  March 05, 2009- Allergic rhinitis, multiple environmental sensitivities Was on antibbiotic for cataract surgery. since she finished that she notes a little supraorbital tenderness on right. Fexofenadine helps drainage in throat but dries her eye. Using an eye ointment. Had pneumovax again this year- got red large local reaction. Got flu vax. Denies chest problems- cough or wheeze.   Current Medications (verified): 1)  Cyanocobalamin 1000 Mcg/ml Soln (Cyanocobalamin) .... One Cc Im Evey 3 Weeks 2)  Starlix 120 Mg  Tabs (Nateglinide) .... Tid 3)  Glucophage 500 Mg  Tabs (Metformin Hcl) .... Tid 4)  Co Q-10 Plus Red Yeast Rice 60-600 Mg  Caps (Coenzyme Q10-Red Yeast Rice) .Marland Kitchen.. 1 Once Daily 5)  Red Wine  Extract Plus   Caps (Misc Natural Products) .... Once Daily 6)  Alprazolam 0.25 Mg  Tabs (Alprazolam) .... As Needed 7)  Boniva 150 Mg  Tabs (Ibandronate Sodium) .... One By Mouth Monthy 8)  Astelin 137 Mcg/spray  Soln (Azelastine Hcl) .... Two Sprays Q Nare Bid 9)  Tarka 2-180 Mg  Tbcr (Trandolapril-Verapamil Hcl) .Marland Kitchen.. 1 Once Daily 10)  Zantac 150 Maximum Strength 150 Mg  Tabs (Ranitidine Hcl) .... Otc At The Drug Stor One A Night For At Least 2 Weeks Then As Needed  When You Feel Bloated 11)  Solaraze 3 %  Gel (Diclofenac Sodium) .... Apply Twicedaily To Area 12)  Allegra 60 Mg  Tabs (Fexofenadine Hcl) .Marland Kitchen.. 1 Two Times A Day As Needed For Allergy 13)  Qvar 80 Mcg/act Aers (Beclomethasone Dipropionate) .... 2 Puffs and Rinse, Twice Daily 14)  Furosemide 20 Mg Tabs (Furosemide) .Marland Kitchen.. 1 Once Daily As Needed 15)  Lovaza 1 Gm Caps (Omega-3-Acid Ethyl Esters) .... 2 Two Times A Day 16)  Calcium 600/vitamin D 600-400 Mg-Unit Chew (Calcium Carbonate-Vitamin D) .Marland Kitchen.. 1 Two Times A Day 17)  D 1000 Plus  Tabs (Fa-Cyanocobalamin-B6-D-Ca) .Marland Kitchen.. 1 Two Times A Day 18)  K-Tabs 10 Meq Cr-Tabs (Potassium Chloride) .Marland Kitchen.. 1 Once Daily 19)  Astepro 137 Mcg/spray Soln (Azelastine Hcl) .... Use As Directed 20)  Benzonatate 100 Mg Caps (Benzonatate) .... One By Mouth Q 6 Hours As Needed Cough 21)  Ferrous Sulfate 325 (65 Fe) Mg Tbec (Ferrous Sulfate) .... One By Mouth Three Times A Week  Allergies (verified): 1)  ! * Dust 2)  ! * Smoke  3)  ! * Ivp Dye 4)  ! Duricef 5)  ! * Sulphur 6)  ! Penicillin 7)  ! Tegretol 8)  ! * Cinebac 9)  ! * Nicobid 10)  ! * Questran 11)  ! * Ru-Tuss 12)  ! * Actonel 13)  ! * Atacand 14)  ! Norvasc 15)  ! * Mobic 16)  ! * Bextra 17)  ! * Ketek 18)  ! * Prevachol 19)  ! Hydrocodone 20)  ! Relafen 21)  ! * Niaspan 22)  ! Vicodin 23)  ! * Zephrex La 24)  ! Aspirin 25)  ! Seldane 26)  ! Codeine 27)  ! Vioxx 28)  ! Naprosyn 29)  ! * Zetia 30)  ! Celebrex 31)  ! *  Loetrel 32)  ! * Micardis 33)  ! * Ziac 34)  ! * Tarka 35)  ! * Sular 36)  ! Morphine 37)  ! * Omnicef 38)  ! * Questran 39)  ! * Optivar Eye Drops 40)  ! * Clonodine 41)  ! Verapamil 42)  ! * Solumderol 43)  ! * Pneumonia 44)  ! * Pollen 45)  ! * Mold  Past History:  Past Medical History: Last updated: 12/16/2006 Allergies Diabetes mellitus, type II Hypertension Osteoarthritis Palpatations Allergic rhinitis Anemia-NOS Colonic polyps, hx of GERD Hyperlipidemia Chronic Facial Pain Acral Numbness Low Vit B12 Levels Upper Arm Knots Dylipidemia Chronic Foot Pain Chronic Dry Cough Obesity  Past Surgical History: Last updated: 12/16/2006 Colonoscopy Small Adenomatous polyp removal  Family History: Last updated: 12/16/2006 Family History of Cardiovascular disorder Family History Diabetes 1st degree relative Family History of Colon CA 1st degree relative <60  Social History: Last updated: 09/05/2007 Retired Single Patient never smoked.   Risk Factors: Exercise: no (03/23/2007)  Risk Factors: Smoking Status: never (09/05/2007)  Review of Systems      See HPI  The patient denies anorexia, fever, weight loss, weight gain, vision loss, decreased hearing, hoarseness, chest pain, syncope, dyspnea on exertion, peripheral edema, prolonged cough, headaches, hemoptysis, abdominal pain, and severe indigestion/heartburn.    Vital Signs:  Patient profile:   77 year old female Weight:      157 pounds O2 Sat:      97 % on Room air Pulse rate:   58 / minute BP sitting:   114 / 70  (left arm)  Vitals Entered By: Vernie Murders (March 05, 2009 10:42 AM)  O2 Flow:  Room air  Physical Exam  Additional Exam:  General: A/Ox3; pleasant and cooperative, NAD, talkative SKIN: no rash, lesions NODES: no lymphadenopathy HEENT: [place]/AT, EOM- WNL, Conjuctivae- clear, PERRLA, TM-WNL, Nose- clear, Throat- clear and wnl, dry oral mucosa NECK: Supple w/ fair ROM, JVD- none,  normal carotid impulses w/o bruits Thyroid- CHEST: Clear to P&A, minor dry cough HEART: RRR, no m/g/r heard ABDOMEN:  ZOX:WRUE, nl pulses, no edema  NEURO: Grossly intact to observation      Impression & Recommendations:  Problem # 1:  ALLERGIC RHINITIS (ICD-477.9) Doubt rhinosinusitis. Will try Neti pot rather than antibiotics now. Fair control. She is having some problem balancing drying antihistamine with over dry eyes. Discussed lower dose anitihistamine. The following medications were removed from the medication list:    Astepro 137 Mcg/spray Soln (Azelastine hcl) ..... Use as directed Her updated medication list for this problem includes:    Astelin 137 Mcg/spray Soln (Azelastine hcl) .Marland Kitchen... 1-2 puffs each nostril up to twice daily if needed  Allegra 60 Mg Tabs (Fexofenadine hcl) .Marland Kitchen... 1 two times a day as needed for allergy  Problem # 2:  ASTHMA (ICD-493.90) We are refilling meds.  Medications Added to Medication List This Visit: 1)  Astelin 137 Mcg/spray Soln (Azelastine hcl) .Marland Kitchen.. 1-2 puffs each nostril up to twice daily if needed  Other Orders: Est. Patient Level III (19147)  Patient Instructions: 1)  Please schedule a follow-up appointment in 6 months. 2)  Refilled Qvar and fexofenadine.  3)  Try cutting fexofenadine in half and just taking a half tab = 30 mg,  4)  Try otc nasal saline gel for dry nose. 5)  Try neti pot for rinsing your nose to help stuffiness and dryness. Prescriptions: ASTELIN 137 MCG/SPRAY  SOLN (AZELASTINE HCL) 1-2 puffs each nostril up to twice daily if needed  #3 x 3   Entered and Authorized by:   Waymon Budge MD   Signed by:   Waymon Budge MD on 03/05/2009   Method used:   Print then Give to Patient   RxID:   [id number] QVAR 80 MCG/ACT AERS (BECLOMETHASONE DIPROPIONATE) 2 puffs and rinse, twice daily  #3 x 3   Entered and Authorized by:   Waymon Budge MD   Signed by:   Waymon Budge MD on 03/05/2009   Method used:    Print then Give to Patient   RxID:   [id number] ALLEGRA 60 MG  TABS (FEXOFENADINE HCL) 1 two times a day as needed for allergy  #180 x 3   Entered and Authorized by:   Waymon Budge MD   Signed by:   Waymon Budge MD on 03/05/2009   Method used:   Print then Give to Patient   RxID:   [id number]

## 2010-06-05 NOTE — Progress Notes (Signed)
Summary: rx refills  Phone Note Call from Patient   Caller: Patient Call For: Young Summary of Call: Requesting rx for allegra and Qvar be called to [phone].  90 day supply with additional refills.  Pt ID is [id number] Initial call taken by: Vernie Murders,  March 23, 2008 12:17 PM  Follow-up for Phone Call        Spoke with pt.  [facility]  This was done and she is aware. Follow-up by: Vernie Murders,  March 23, 2008 12:21 PM      Prescriptions: ALLEGRA 60 MG  TABS (FEXOFENADINE HCL) 1 two times a day as needed for allergy  #180 x 3   Entered by:   Vernie Murders   Authorized by:   Waymon Budge MD   Signed by:   Vernie Murders on 03/23/2008   Method used:   Faxed to ...       [facility] (mail-order)       [address]       [place], [place]  [address]       Ph: [phone]       Fax: [phone]   RxID:   [id number] QVAR 80 MCG/ACT AERS (BECLOMETHASONE DIPROPIONATE) 2 puffs and rinse, twice daily  #3 x 3   Entered by:   Vernie Murders   Authorized by:   Waymon Budge MD   Signed by:   Vernie Murders on 03/23/2008   Method used:   Faxed to ...       [facility] (mail-order)       [address]       [place], [place]  [address]       Ph: [phone]       Fax: [phone]   RxID:   [id number]

## 2010-06-05 NOTE — Assessment & Plan Note (Signed)
Summary: 2 month rov/njr   Vital Signs:  Patient profile:   77 year old female Height:      68 inches Weight:      152 pounds BMI:     23.20 Temp:     98.2 degrees F oral Pulse rate:   64 / minute Resp:     14 per minute BP sitting:   130 / 80  (left arm)  Vitals Entered By: Willy Eddy, LPN ([place]  5, 1610 9:46 AM) CC: roa- colestid is working well to control diarrhe, Lipid Management   Primary Care Provider:  Stacie Glaze MD  CC:  roa- colestid is working well to control diarrhe and Lipid Management.  History of Present Illness: The pts cough and conjestion better the diarrhea is better with the medications the pt has tolerated the colestid still on the lovasa ( omega 3)  Lipid Management History:      Positive NCEP/ATP III risk factors include female age 62 years old or older, diabetes, and hypertension.  Negative NCEP/ATP III risk factors include non-tobacco-user status, no ASHD (atherosclerotic heart disease), no prior stroke/TIA, no peripheral vascular disease, and no history of aortic aneurysm.     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) 3)  Asthma  (ICD-493.90) 4)  Acute Maxillary Sinusitis  (ICD-461.0) 5)  Eustachian Tube Dysfunction, Right  (ICD-381.81) 6)  Irritable Bowel Syndrome  (ICD-564.1) 7)  Cough  (ICD-786.2) 8)  Family History of Colon Ca 1st Degree Relative <60  (ICD-V16.0) 9)  Family History Diabetes 1st Degree Relative  (ICD-V18.0) 10)  Hyperlipidemia  (ICD-272.4) 11)  Gerd  (ICD-530.81) 12)  Colonic Polyps, Hx of  (ICD-V12.72) 13)  Anemia-nos  (ICD-285.9) 14)  Allergic Rhinitis  (ICD-477.9) 15)  Osteoarthritis  (ICD-715.90) 16)  Hypertension  (ICD-401.9) 17)  Diabetes Mellitus, Type II  (ICD-250.00)  Current Medications (verified): 1)  Cyanocobalamin 1000 Mcg/ml Soln  (Cyanocobalamin) .... One Cc Im Evey 3 Weeks 2)  Starlix 120 Mg  Tabs (Nateglinide) .... Tid 3)  Glucophage 500 Mg  Tabs (Metformin Hcl) .... Tid 4)  Co Q-10 Plus Red Yeast Rice 60-600 Mg  Caps (Coenzyme Q10-Red Yeast Rice) .Marland Kitchen.. 1 Once Daily 5)  Red Wine Extract Plus   Caps (Misc Natural Products) .... Once Daily 6)  Alprazolam 0.25 Mg  Tabs (Alprazolam) .... As Needed 7)  Boniva 150 Mg  Tabs (Ibandronate Sodium) .... One By Mouth Monthy 8)  Astelin 137 Mcg/spray  Soln (Azelastine Hcl) .Marland Kitchen.. 1-2 Puffs Each Nostril Up To Twice Daily If Needed 9)  Tarka 2-180 Mg  Tbcr (Trandolapril-Verapamil Hcl) .Marland Kitchen.. 1 Once Daily 10)  Zantac 150 Maximum Strength 150 Mg  Tabs (Ranitidine Hcl) .... Otc At The Drug Stor One A Night For At Least 2 Weeks Then As Needed  When You Feel Bloated 11)  Solaraze 3 %  Gel (Diclofenac Sodium) .... Apply Twicedaily To Area 12)  Allegra 60 Mg  Tabs (Fexofenadine Hcl) .Marland Kitchen.. 1 Two Times A Day As Needed For Allergy 13)  Qvar 80 Mcg/act Aers (Beclomethasone Dipropionate) .... 2 Puffs and Rinse, Twice Daily 14)  Furosemide 20 Mg Tabs (Furosemide) .Marland Kitchen.. 1 Once Daily As Needed 15)  Lovaza 1 Gm Caps (Omega-3-Acid Ethyl Esters) .Marland Kitchen.. 1 Two Times A Day 16)  Calcium 600/vitamin D 600-400 Mg-Unit Chew (Calcium Carbonate-Vitamin D) .Marland Kitchen.. 1 Two Times  A Day 17)  D 1000 Plus  Tabs (Fa-Cyanocobalamin-B6-D-Ca) .Marland Kitchen.. 1 Two Times A Day 18)  Benzonatate 100 Mg Caps (Benzonatate) .... One By Mouth Q 6 Hours As Needed Cough 19)  Ferrous Sulfate 325 (65 Fe) Mg Tbec (Ferrous Sulfate) .... One By Mouth Three Times A Week 20)  Nasonex 50 Mcg/act Susp (Mometasone Furoate) .... Two Sprays in The Nostril Daily 21)  Colestid 5 Gm Gran (Colestipol Hcl) .... 5gm in Water Daily ( [place] Have Flavors If She Desires) in Am As Directed  Allergies (verified): 1)  ! * Dust 2)  ! * Smoke 3)  ! * Ivp Dye 4)  ! Duricef 5)  ! * Sulphur 6)  ! Penicillin 7)  ! Tegretol 8)  ! * Cinebac 9)  ! * Nicobid 10)  ! *  Questran 11)  ! * Ru-Tuss 12)  ! * Actonel 13)  ! * Atacand 14)  ! Norvasc 15)  ! * Mobic 16)  ! * Bextra 17)  ! * Ketek 18)  ! * Prevachol 19)  ! Hydrocodone 20)  ! Relafen 21)  ! * Niaspan 22)  ! Vicodin 23)  ! * Zephrex La 24)  ! Aspirin 25)  ! Seldane 26)  ! Codeine 27)  ! Vioxx 28)  ! Naprosyn 29)  ! * Zetia 30)  ! Celebrex 31)  ! * Loetrel 32)  ! * Micardis 33)  ! * Ziac 34)  ! * Tarka 35)  ! * Sular 36)  ! Morphine 37)  ! * Omnicef 38)  ! * Questran 39)  ! * Optivar Eye Drops 40)  ! * Clonodine 41)  ! Verapamil 42)  ! * Solumderol 43)  ! * Pneumonia 44)  ! * Pollen 45)  ! * Mold  Past History:  Family History: Last updated: 12/16/2006 Family History of Cardiovascular disorder Family History Diabetes 1st degree relative Family History of Colon CA 1st degree relative <60  Social History: Last updated: 09/05/2007 Retired Single Patient never smoked.   Risk Factors: Exercise: no (03/23/2007)  Risk Factors: Smoking Status: never (09/05/2009)  Past medical, surgical, family and social histories (including risk factors) reviewed, and no changes noted (except as noted below).  Past Medical History: Reviewed history from 12/16/2006 and no changes required. Allergies Diabetes mellitus, type II Hypertension Osteoarthritis Palpatations Allergic rhinitis Anemia-NOS Colonic polyps, hx of GERD Hyperlipidemia Chronic Facial Pain Acral Numbness Low Vit B12 Levels Upper Arm Knots Dylipidemia Chronic Foot Pain Chronic Dry Cough Obesity  Past Surgical History: Reviewed history from 12/16/2006 and no changes required. Colonoscopy Small Adenomatous polyp removal  Family History: Reviewed history from 12/16/2006 and no changes required. Family History of Cardiovascular disorder Family History Diabetes 1st degree relative Family History of Colon CA 1st degree relative <60  Social History: Reviewed history from 09/05/2007 and no changes  required. Retired Single Patient never smoked.   Review of Systems  The patient denies anorexia, fever, weight loss, vision loss, decreased hearing, hoarseness, chest pain, syncope, dyspnea on exertion, peripheral edema, prolonged cough, headaches, hemoptysis, abdominal pain, melena, hematochezia, severe indigestion/heartburn, hematuria, incontinence, genital sores, muscle weakness, suspicious skin lesions, transient blindness, difficulty walking, depression, unusual weight change, abnormal bleeding, enlarged lymph nodes, angioedema, and breast masses.    Physical Exam  General:  healthy appearing.  well-developed.   Head:  normocephalic.   Eyes:  pupils equal and pupils round.   Ears:  R ear normal and L ear normal.   Nose:  nasal dischargemucosal pallor, mucosal edema, and airflow obstruction.   Mouth:  pharyngeal erythema and posterior lymphoid hypertrophy.   Neck:  No deformities, masses, or tenderness noted. Lungs:  Normal respiratory effort, chest expands symmetrically. Lungs are clear to auscultation, no crackles or wheezes. Heart:  Normal rate and regular rhythm. S1 and S2 normal without gallop, murmur, click, rub or other extra sounds. Abdomen:  Bowel sounds positive,abdomen soft and non-tender without masses, organomegaly or hernias noted. Msk:  no joint swelling and no redness over joints.   Extremities:  No clubbing, cyanosis, edema, or deformity noted with normal full range of motion of all joints.     Impression & Recommendations:  Problem # 1:  DUMPING SYNDROME (ICD-564.2) the colestid is working  Problem # 2:  ANEMIA-NOS (ICD-285.9)  will obtain levels and give injectoon prior to the Her updated medication list for this problem includes:    Cyanocobalamin 1000 Mcg/ml Soln (Cyanocobalamin) ..... One cc im evey 3 weeks    D 1000 Plus Tabs (Fa-cyanocobalamin-b6-d-ca) .Marland Kitchen... 1 two times a day    Ferrous Sulfate 325 (65 Fe) Mg Tbec (Ferrous sulfate) ..... One by mouth  three times a week  Hgb: 11.8 (10/04/2008)   Hct: 35.4 (10/04/2008)   Platelets: 262.0 (10/04/2008) RBC: 3.88 (10/04/2008)   RDW: 13.1 (10/04/2008)   WBC: 5.5 (10/04/2008) MCV: 91.2 (10/04/2008)   MCHC: 33.3 (10/04/2008) Iron: 91 (10/04/2008)   TSH: 2.95 (10/04/2008)  Problem # 3:  HYPERLIPIDEMIA (ICD-272.4) monitering Her updated medication list for this problem includes:    Lovaza 1 Gm Caps (Omega-3-acid ethyl esters) .Marland Kitchen... 1 two times a day    Colestid 5 Gm Gran (Colestipol hcl) .Marland Kitchen... 5gm in water daily ( [place] have flavors if she desires) in am as directed  Orders: TLB-Cholesterol, HDL (83718-HDL) TLB-Cholesterol, Direct LDL (83721-DIRLDL) TLB-Cholesterol, Total (82465-CHO) TLB-TSH (Thyroid Stimulating Hormone) (84443-TSH)  Lipid Goals: Chol Goal: 200 (05/17/2008)   HDL Goal: 40 (05/17/2008)   LDL Goal: 100 (05/17/2008)   TG Goal: 150 (05/17/2008)  Prior 10 Yr Risk Heart Disease: Not enough information (03/23/2007)  Problem # 4:  DIABETES MELLITUS, TYPE II (ICD-250.00) moniter labs Her updated medication list for this problem includes:    Starlix 120 Mg Tabs (Nateglinide) .Marland Kitchen... Tid    Glucophage 500 Mg Tabs (Metformin hcl) .Marland Kitchen... Tid    Tarka 2-180 Mg Tbcr (Trandolapril-verapamil hcl) .Marland Kitchen... 1 once daily  Orders: TLB-BMP (Basic Metabolic Panel-BMET) (80048-METABOL) TLB-A1C / Hgb A1C (Glycohemoglobin) (83036-A1C)  Labs Reviewed: Creat: 0.9 (09/28/2007)     Last Eye Exam: normal (12/18/2008) Reviewed HgBA1c results: 7.0 (11/12/2008)  7.2 (09/28/2007)  Complete Medication List: 1)  Cyanocobalamin 1000 Mcg/ml Soln (Cyanocobalamin) .... One cc im evey 3 weeks 2)  Starlix 120 Mg Tabs (Nateglinide) .... Tid 3)  Glucophage 500 Mg Tabs (Metformin hcl) .... Tid 4)  Co Q-10 Plus Red Yeast Rice 60-600 Mg Caps (Coenzyme q10-red yeast rice) .Marland Kitchen.. 1 once daily 5)  Red Wine Extract Plus Caps (Misc natural products) .... Once daily 6)  Alprazolam 0.25 Mg Tabs (Alprazolam) .... As  needed 7)  Boniva 150 Mg Tabs (Ibandronate sodium) .... One by mouth monthy 8)  Astelin 137 Mcg/spray Soln (Azelastine hcl) .Marland Kitchen.. 1-2 puffs each nostril up to twice daily if needed 9)  Tarka 2-180 Mg Tbcr (Trandolapril-verapamil hcl) .Marland Kitchen.. 1 once daily 10)  Zantac 150 Maximum Strength 150 Mg Tabs (Ranitidine hcl) .... Otc at the drug stor one a night for at least 2 weeks then as needed  when  you feel bloated 11)  Solaraze 3 % Gel (Diclofenac sodium) .... Apply twicedaily to area 12)  Allegra 60 Mg Tabs (Fexofenadine hcl) .Marland Kitchen.. 1 two times a day as needed for allergy 13)  Qvar 80 Mcg/act Aers (Beclomethasone dipropionate) .... 2 puffs and rinse, twice daily 14)  Furosemide 20 Mg Tabs (Furosemide) .Marland Kitchen.. 1 once daily as needed 15)  Lovaza 1 Gm Caps (Omega-3-acid ethyl esters) .Marland Kitchen.. 1 two times a day 16)  Calcium 600/vitamin D 600-400 Mg-unit Chew (Calcium carbonate-vitamin d) .Marland Kitchen.. 1 two times a day 17)  D 1000 Plus Tabs (Fa-cyanocobalamin-b6-d-ca) .Marland Kitchen.. 1 two times a day 18)  Benzonatate 100 Mg Caps (Benzonatate) .... One by mouth q 6 hours as needed cough 19)  Ferrous Sulfate 325 (65 Fe) Mg Tbec (Ferrous sulfate) .... One by mouth three times a week 20)  Nasonex 50 Mcg/act Susp (Mometasone furoate) .... Two sprays in the nostril daily 21)  Colestid 5 Gm Gran (Colestipol hcl) .... 5gm in water daily ( [place]eltz have flavors if she desires) in am as directed  Other Orders: Venipuncture (40981) TLB-CBC Platelet - w/Differential (85025-CBCD) TLB-B12 + Folate Pnl (82746_82607-B12/FOL)  Lipid Assessment/Plan:      Based on NCEP/ATP III, the patient's risk factor category is "history of diabetes".  The patient's lipid goals are as follows: Total cholesterol goal is 200; LDL cholesterol goal is 100; HDL cholesterol goal is 40; Triglyceride goal is 150.    Patient Instructions: 1)  Please schedule a follow-up appointment in 3 months.  Appended Document: Orders Update     Clinical Lists  Changes  Orders: Added new Service order of Vit B12 1000 mcg (X9147) - Signed Added new Service order of Admin of Therapeutic Inj  intramuscular or subcutaneous (82956) - Signed       Medication Administration  Injection # 1:    Medication: Vit B12 1000 mcg    Diagnosis: ANEMIA-NOS (ICD-285.9)    Route: IM    Site: L deltoid    Exp Date: 01/31/2011    Lot #: [id number]    Mfr: American Regent    Patient tolerated injection without complications    Given by: Willy Eddy, LPN ([place]  5, 2130 12:13 PM)  Orders Added: 1)  Vit B12 1000 mcg [J3420] 2)  Admin of Therapeutic Inj  intramuscular or subcutaneous [86578]

## 2010-06-05 NOTE — Progress Notes (Signed)
Summary: REFILL TARKA   Phone Note Call from Patient Call back at [facility] Phone [phone]   Caller: Patient Call For: DR Lennon Alstrom Reason for Call: Refill Medication Summary of Call: HAVING TROUBLE KEEPING BP UNDER CONTROL WITH THE MEDS THAT SHE IS CURRENTLY ON. WANTS SOMETHING ELSE FAXED TO [employer]. DIASTOLIC RX DOES NOT WORK. WOULD LIKE REFILL OF TARKA. BP HIGHS 150/90  156/81, PULSE-102. SHE TOOK I/2 OF A  TARKA AND BP CAME DOWN TO 126/80. SHE WILL GO BACK 1/2 AT NIGHT. [employer] PHONE NUMBER IS [phone] Initial call taken by: Warnell Forester,  November 24, 2006 9:01 AM  Follow-up for Phone Call        this is requesting authorization for the [facility]  180/2 which is not covered by her [employer], benefits. She has failed many medications, including verapamil, Benicar, Altace, atenolol, coreg, and Cardizem.  Please contact [employer] for authorization of this medication.  Please notify patient that we [place] call in a temporary prescription of the medication at her expense because the authorization.  [place] take as much as two weeks. Follow-up by: Stacie Glaze MD,  November 24, 2006 11:19 AM  Additional Follow-up for Phone Call Additional follow up Details #1::        talked with pt.  SHe has enough tarka to last for several weeks.  WE will contact her with PA. Additional Follow-up by: Willy Eddy, LPN,  November 24, 2006 11:27 AM    Additional Follow-up for Phone Call Additional follow up Details #2::    Called [employer] 07/24 @ 4:00/Medication is covered under insurance. Patient has to pay a $22 co-pay. Pt aware 07/24 @ 4:03 Follow-up by: Florentina Addison,  November 25, 2006 4:03 PM  Additional Follow-up for Phone Call Additional follow up Details #3:: Details for Additional Follow-up Action Taken: talked pt and informed script is read for pick up

## 2010-06-05 NOTE — Progress Notes (Signed)
Summary: BP and BS concerns  Phone Note Call from Patient Call back at [facility] Phone [phone]   Caller: Patient Call For: JENKINS Summary of Call: PT HAVING TROUBLE WITH NEW MEDS  BLOOD PRESSURE CONTIUOUSLY GOING UP.  PLS CALL BACK  PT DOES NOT KNOW WHAT TO DO. Initial call taken by: Shan Levans,  December 29, 2006 9:47 AM  Follow-up for Phone Call        Spoke with pt, BP has been running 180/90, 179/89 pulse 89 156/84, pulse 84. BS 202 when she got up, which is unusual Samples she is trying now is Tekturna, enough to last for 6 weeks, pt experiencing headaches, "not herself".  Offered OV, unable to come due to eyelid surgery tomorrow.  Pt did not want Dr Cato Mulligan to get involved as she wants to wait until Dr Lovell Sheehan gets back.  Because of her eyelid surgery 8/28, she does not want to changed her meds now anyway.  Pt refused OV with Dr Lovell Sheehan upon his return as she "will not be able to get out due to her eyes being black and blue". Tarka causes diarrhea and cough Pharmacy is [facility] Follow-up by: Sid Falcon LPN,  December 29, 2006 11:46 AM  Additional Follow-up for Phone Call Additional follow up Details #1::        the med takes up to 4 weeks to reach optimum bp control with that dose. If not having other side effects continue to give it time.  Control of BS not worisome unless climbs over 250 then need to be seen Additional Follow-up by: Stacie Glaze MD,  January 04, 2007 8:02 AM    Additional Follow-up for Phone Call Additional follow up Details #2::    Called pt, gave her Dr Melody Comas advice.  Pt informed me she had already stopped taking the Tekturna last week Thursday when she had her eye surgery.  Her BS wasrunning in the 300's.  Pt began taking the Tarka again and is again experiencing diarrhea, however her BP this AM was 146/88 and her BS 179.  She has taken only 1/2 tab Tarka yesterday and today, however diarrhea continues.  Offered OV, pt refused stating, "I'm not  going anywhere with my eyes all black and blue"! Follow-up by: Sid Falcon LPN,  January 04, 2007 11:19 AM  Additional Follow-up for Phone Call Additional follow up Details #3:: Details for Additional Follow-up Action Taken: Let her stay on the tarka for now  Pt called back and informed her of Dr Lovell Sheehan message, she hopes to get the diarrhea under control.  Pt has OV scheduled for F/U on 02/02/07 Additional Follow-up by: Stacie Glaze MD,  January 04, 2007 1:57 PM

## 2010-06-05 NOTE — Progress Notes (Signed)
Summary: Waiting for fax or phone call, b 12 med generic pt called back  Medications Added CYANOCOBALAMIN 1000 MCG/ML SOLN (CYANOCOBALAMIN) one cc IM every month       Phone Note Call from Patient Call back at Home Phone [phone]   Caller: patient triage message Call For: [name] Summary of Call: she [facility].  She still does not have the medication as [facility] did not get anything from [place]. [id number] id#  Initial call taken by: Roselle Locus,  June 09, 2007 8:04 AM  Follow-up for Phone Call        Called pt, asked her to contact [facility] again, provide them with our fax or phone number to clarify their request.  She is frustrated with this system.  Offered to callin Rx to a local pharmacy, she refused, wants to use [facility] Follow-up by: Sid Falcon LPN,  June 09, 2007 11:19 AM  Additional Follow-up for Phone Call Additional follow up Details #1::        [facility] # to [facility] B 12 shot. [phone] id # [phone].  ..................................................................Marland KitchenRoselle Locus  June 09, 2007 12:33 PM    Additional Follow-up for Phone Call Additional follow up Details #2::    New mail order Rx for B12 at front window for her to mail to [facility] - left message. Follow-up by: Rudy Jew, RN,  June 10, 2007 2:25 PM  New/Updated Medications: CYANOCOBALAMIN 1000 MCG/ML SOLN (CYANOCOBALAMIN) one cc IM every month   Prescriptions: CYANOCOBALAMIN 1000 MCG/ML SOLN (CYANOCOBALAMIN) one cc IM every month  #2ml vial x 3   Entered by:   Rudy Jew, RN   Authorized by:   Stacie Glaze MD   Signed by:   Rudy Jew, RN on 06/10/2007   Method used:   Print then Give to Patient   RxID:   [id number]

## 2010-06-05 NOTE — Miscellaneous (Signed)
Summary: BONE DENSITY  Clinical Lists Changes  Orders: Added new Test order of T-Lumbar Vertebral Assessment (77082) - Signed 

## 2010-06-05 NOTE — Procedures (Signed)
Summary: Colonoscopy Report/[facility]  Colonoscopy Report/[facility]   Imported By: Maryln Gottron 09/09/2009 15:04:03  _____________________________________________________________________  External Attachment:    Type:   Image     Comment:   External Document

## 2010-06-05 NOTE — Progress Notes (Signed)
Summary: MED FOR NAUSEA AND WEAK    Phone Note Call from Patient Call back at Home Phone [phone]   Caller: PATIENT TRAIGE MESSAGE Call For: JENKINS Summary of Call: FEELS REALLY WEAK AND SICK TO HER STOMACH.  PLEASE CALL IN SOMETHING TO A DRUGSTORE THAT WILL DELIVER AS SHE DOES NOT FEEL LIKE GOING TO GET THE MED  Initial call taken by: Roselle Locus,  April 29, 2007 11:32 AM  Follow-up for Phone Call        she needs evaluation---I'm not sure what to call in. Follow-up by: Birdie Sons MD,  April 29, 2007 12:24 PM  Additional Follow-up for Phone Call Additional follow up Details #1::        PT REFUSES TO COME INTO OFFICE, GET DRESSED, GET A CAB , OR GET OTHER RIDE. SHE REFUSES ANY HELP ECXCEPT IF WE CALL IN SOMETHING FOR NAUSEA. SHE WAS TOLD TO BE EVALUATED BY ANY M.D. ANYWHERE. Additional Follow-up by: Arcola Jansky, RN,  April 29, 2007 4:58 PM

## 2010-06-05 NOTE — Letter (Signed)
Summary: [facility]  [facility]   Imported By: Maryln Gottron 12/12/2009 12:11:31  _____________________________________________________________________  External Attachment:    Type:   Image     Comment:   External Document

## 2010-06-05 NOTE — Letter (Signed)
Summary: Dr. Leslie Dales note  Dr. Leslie Dales note   Imported By: Kassie Mends 02/01/2007 08:54:57  _____________________________________________________________________  External Attachment:    Type:   Image     Comment:   Dr. Leslie Dales note

## 2010-06-05 NOTE — Consult Note (Signed)
Summary: [facility]  [name]   Imported By: Maryln Gottron 12/09/2009 15:48:32  _____________________________________________________________________  External Attachment:    Type:   Image     Comment:   External Document

## 2010-06-05 NOTE — Assessment & Plan Note (Signed)
Summary: ROA/IF  Medications Added CARDURA 4 MG TAB (DOXAZOSIN MESYLATE) Take 1 tablet by mouth once a day at bedtime        Vital Signs:  Patient Profile:   77 Years Old Female Height:     68 inches Weight:      148 pounds Temp:     98.5 degrees F oral Pulse rate:   84 / minute Resp:     14 per minute BP sitting:   140 / 84  (left arm)  Vitals Entered By: Willy Eddy, LPN (March 23, 2007 11:07 AM)                 Chief Complaint:  f/u stopped hydralazine back on tarka that causes diarrhea.  History of Present Illness: Intolerant of multiple medications verapamil works best  but gives pt diarhea   Diabetes Management History:      She has not been enrolled in the "Diabetic Education Program".  She states understanding of dietary principles and is following her diet appropriately.  No sensory loss is reported.  Self foot exams are being performed.  She is checking home blood sugars.  She says that she is not exercising regularly.        Other questions/concerns include: verapamil cannot be tolerated as well as ACE or ARB class.  Since her last visit, no infections have occurred.  No changes have been made to her treatment plan since last visit.    Hypertension History:      She notes the following problems with antihypertensive medication side effects: diarrhea.        Positive major cardiovascular risk factors include female age 29 years old or older, diabetes, hyperlipidemia, and hypertension.      Current Allergies: ! * DUST ! * SMOKE ! * IVP DYE ! DURICEF ! * SULPHUR ! PENICILLIN ! TEGRETOL ! * CINEBAC ! * NICOBID ! Lanetta Inch ! * RU-TUSS ! * ACTONEL ! * ATACAND ! NORVASC ! * MOBIC ! * BEXTRA ! Johnathan Hausen ! * PREVACHOL ! HYDROCODONE ! RELAFEN ! * NIASPAN ! VICODIN ! * ZEPHREX LA ! ASPIRIN ! SELDANE ! CODEINE ! VIOXX ! NAPROSYN ! * ZETIA ! CELEBREX ! * LOETREL ! * MICARDIS ! [facility] ! Jolene Provost ! * SULAR ! MORPHINE ! * OMNICEF ! Lanetta Inch ! * OPTIVAR EYE DROPS ! * CLONODINE ! VERAPAMIL ! * SOLUMDEROL ! * POLLEN ! * MOLD    Risk Factors:  Exercise:  no    Physical Exam  General:     eldery wf in nad Head:     normocephalic.   Eyes:     pupils equal and pupils round.   Ears:     R ear normal and L ear normal.   Mouth:     pharynx pink and moist.   Neck:     No deformities, masses, or tenderness noted. Lungs:     normal respiratory effort and no wheezes.   Heart:     normal rate and regular rhythm.   Abdomen:     soft, non-tender, and bowel sounds hyperactive.   Msk:     normal ROM and no joint tenderness.   Neurologic:     alert & oriented X3.      Impression & Recommendations:  Problem # 1:  HYPERTENSION (ICD-401.9)  The following medications were removed from the medication list:    Tarka 2-180 Mg Tbcr (Trandolapril-verapamil hcl) .Marland KitchenMarland KitchenMarland KitchenMarland Kitchen  1/2 once daily    Hydralazine Hcl 25 Mg Tabs (Hydralazine hcl) ..... One by mouth tid  Her updated medication list for this problem includes:    Lasix 20 Mg Tabs (Furosemide) ..... Once daily    Cardura 4 Mg Tab (Doxazosin mesylate) .Marland Kitchen... Take 1 tablet by mouth once a day at bedtime  BP today: 140/84 Prior BP: 120/70 (12/22/2006)  10 Yr Risk Heart Disease: Not enough information   Problem # 2:  DIABETES MELLITUS, TYPE II (ICD-250.00)  The following medications were removed from the medication list:    Tarka 2-180 Mg Tbcr (Trandolapril-verapamil hcl) .Marland Kitchen... 1/2 once daily  Her updated medication list for this problem includes:    Starlix 120 Mg Tabs (Nateglinide) .Marland Kitchen... Tid    Glucophage 500 Mg Tabs (Metformin hcl) .Marland Kitchen... Tid   Problem # 3:  GERD (ICD-530.81)  Diagnostics Reviewed:  Discussed lifestyle modifications, diet, antacids/medications, and preventive measures. Handout provided.   Complete Medication List: 1)  Cyanocobalamin 1000 Mcg/ml Soln (Cyanocobalamin) .... Every month 2)  Starlix 120 Mg Tabs (Nateglinide) .... Tid 3)   Glucophage 500 Mg Tabs (Metformin hcl) .... Tid 4)  Niacin Cr 500 Mg Tbcr (Niacin) .... Once daily 5)  Patanol 0.1 % Soln (Olopatadine hcl) .... As needed 6)  Lasix 20 Mg Tabs (Furosemide) .... Once daily 7)  Red Wine Extract Plus Caps (Misc natural products) .... Once daily 8)  Alprazolam 0.25 Mg Tabs (Alprazolam) .... As needed 9)  Ultram Er 100 Mg Tb24 (Tramadol hcl) .... As needed\par 10)  Boniva 150 Mg Tabs (Ibandronate sodium) .... One by mouth monthy 11)  Astelin 137 Mcg/spray Soln (Azelastine hcl) .... Two sprays q nare bid 12)  Cardura 4 Mg Tab (Doxazosin mesylate) .... Take 1 tablet by mouth once a day at bedtime  Diabetes Management Assessment/Plan:      The following lipid goals have been established for the patient: Total cholesterol goal of 200; LDL cholesterol goal of 100; HDL cholesterol goal of 40; Triglyceride goal of 200.  Her blood pressure goal is < 130/80.    Hypertension Assessment/Plan:      The patient's hypertensive risk group is category C: Target organ damage and/or diabetes.  Today's blood pressure is 140/84.  Her blood pressure goal is < 130/80.   Patient Instructions: 1)  take one half of the cardutra for one week then go to one whole cardura. Take it at bed time 2)  Please schedule a follow-up appointment in 1 month.    Prescriptions: CARDURA 4 MG TAB (DOXAZOSIN MESYLATE) Take 1 tablet by mouth once a day at bedtime  #30 x 11   Entered and Authorized by:   Stacie Glaze MD   Signed by:   Stacie Glaze MD on 03/23/2007   Method used:   Print then Give to Patient   RxID:   [id number]  ]

## 2010-07-29 ENCOUNTER — Encounter: Payer: Self-pay | Admitting: Internal Medicine

## 2010-08-14 ENCOUNTER — Ambulatory Visit: Payer: Self-pay | Admitting: Internal Medicine

## 2010-08-20 ENCOUNTER — Other Ambulatory Visit: Payer: Self-pay | Admitting: Internal Medicine

## 2010-08-20 DIAGNOSIS — Z1231 Encounter for screening mammogram for malignant neoplasm of breast: Secondary | ICD-10-CM

## 2010-09-01 ENCOUNTER — Encounter: Payer: Self-pay | Admitting: Internal Medicine

## 2010-09-01 ENCOUNTER — Ambulatory Visit (INDEPENDENT_AMBULATORY_CARE_PROVIDER_SITE_OTHER): Payer: Medicare Other | Admitting: Internal Medicine

## 2010-09-01 VITALS — BP 130/80 | HR 72 | Temp 98.2°F | Resp 16 | Ht 67.5 in | Wt 150.0 lb

## 2010-09-01 DIAGNOSIS — E785 Hyperlipidemia, unspecified: Secondary | ICD-10-CM

## 2010-09-01 DIAGNOSIS — K911 Postgastric surgery syndromes: Secondary | ICD-10-CM

## 2010-09-01 DIAGNOSIS — E119 Type 2 diabetes mellitus without complications: Secondary | ICD-10-CM

## 2010-09-01 DIAGNOSIS — I10 Essential (primary) hypertension: Secondary | ICD-10-CM

## 2010-09-01 DIAGNOSIS — D649 Anemia, unspecified: Secondary | ICD-10-CM

## 2010-09-01 LAB — BASIC METABOLIC PANEL
BUN: 10 mg/dL (ref 6–23)
CO2: 26 mEq/L (ref 19–32)
Calcium: 9.2 mg/dL (ref 8.4–10.5)
Chloride: 99 mEq/L (ref 96–112)
Creatinine, Ser: 0.9 mg/dL (ref 0.4–1.2)
GFR: 63.47 mL/min (ref >60.00)
Glucose, Bld: 154 mg/dL — ABNORMAL HIGH (ref 70–99)
Potassium: 3.7 mEq/L (ref 3.5–5.1)
Sodium: 133 mEq/L — ABNORMAL LOW (ref 135–145)

## 2010-09-01 LAB — CBC WITH DIFFERENTIAL/PLATELET
Basophils Absolute: 0 10*3/uL (ref 0.0–0.1)
Basophils Relative: 0.5 % (ref 0.0–3.0)
Eosinophils Absolute: 0 10*3/uL (ref 0.0–0.7)
Eosinophils Relative: 0.6 % (ref 0.0–5.0)
HCT: 33.8 % — ABNORMAL LOW (ref 36.0–46.0)
Hemoglobin: 11.8 g/dL — ABNORMAL LOW (ref 12.0–15.0)
Lymphocytes Relative: 33.3 % (ref 12.0–46.0)
Lymphs Abs: 1.9 10*3/uL (ref 0.7–4.0)
MCHC: 34.8 g/dL (ref 30.0–36.0)
MCV: 89.1 fl (ref 78.0–100.0)
Monocytes Absolute: 0.3 10*3/uL (ref 0.1–1.0)
Monocytes Relative: 6.2 % (ref 3.0–12.0)
Neutro Abs: 3.3 10*3/uL (ref 1.4–7.7)
Neutrophils Relative %: 59.4 % (ref 43.0–77.0)
Platelets: 244 10*3/uL (ref 150.0–400.0)
RBC: 3.79 Mil/uL — ABNORMAL LOW (ref 3.87–5.11)
RDW: 13.8 % (ref 11.5–14.6)
WBC: 5.6 10*3/uL (ref 4.5–10.5)

## 2010-09-01 LAB — LIPID PANEL
Cholesterol: 110 mg/dL (ref 0–200)
HDL: 34.6 mg/dL — ABNORMAL LOW (ref >39.00)
LDL Cholesterol: 52 mg/dL (ref 0–99)
Total CHOL/HDL Ratio: 3
Triglycerides: 115 mg/dL (ref 0.0–149.0)
VLDL: 23 mg/dL (ref 0.0–40.0)

## 2010-09-01 LAB — HEMOGLOBIN A1C: Hgb A1c MFr Bld: 7.2 % — ABNORMAL HIGH (ref 4.6–6.5)

## 2010-09-01 NOTE — Assessment & Plan Note (Signed)
Blood pressure stable today we will monitor her basic metabolic panel

## 2010-09-01 NOTE — Assessment & Plan Note (Signed)
Patient has been able to decrease her use of Colestid after starting January her diarrheal syndrome has markedly improved

## 2010-09-01 NOTE — Assessment & Plan Note (Signed)
Hemoglobin A1c will be drawn today.

## 2010-09-01 NOTE — Assessment & Plan Note (Signed)
The patient is due a lipid panel.

## 2010-09-01 NOTE — Progress Notes (Signed)
Subjective:    Patient ID: [name], female    DOB: 07-30-1929, 77 y.o.   MRN: [id number]  HPI  Patient is a 77 year old white female who presents for followup of hypertension hyperlipidemia adult-onset diabetes previously poorly controlled and a history of a irritable bowel with diarrhea prominence.  She was on Colestid her control of her diarrhea but since we placed her on January her dumping syndrome has improved as well as her blood sugars.  She states that her blood pressure has been stable she denies any chest pain PND shortness of breath or orthopnea  Review of Systems  Constitutional: Negative for activity change, appetite change and fatigue.  HENT: Negative for ear pain, congestion, neck pain, postnasal drip and sinus pressure.   Eyes: Negative for redness and visual disturbance.  Respiratory: Negative for cough, shortness of breath and wheezing.   Gastrointestinal: Negative for abdominal pain and abdominal distention.  Genitourinary: Negative for dysuria, frequency and menstrual problem.  Musculoskeletal: Negative for myalgias, joint swelling and arthralgias.  Skin: Negative for rash and wound.  Neurological: Negative for dizziness, weakness and headaches.  Hematological: Negative for adenopathy. Does not bruise/bleed easily.  Psychiatric/Behavioral: Negative for sleep disturbance and decreased concentration.       Past Medical History  Diagnosis Date  . Seasonal allergies   . Diabetes mellitus   . HTN (hypertension)   . Osteoarthritis   . Palpitations   . Anemia   . History of colonic polyps   . GERD (gastroesophageal reflux disease)   . Hyperlipidemia   . Chronic facial pain   . Vitamin B12 deficiency   . Dyslipidemia   . Chronic foot pain   . Chronic cough   . Obesity    Past Surgical History  Procedure Date  . Colonoscopy   . Polypectomy     small adenomatous    reports that she has never smoked. She does not have any smokeless tobacco history on  file. She reports that she does not drink alcohol or use illicit drugs. family history includes Colon cancer in an unspecified family member; Diabetes in her father; and Heart disease in her mother. Allergies  Allergen Reactions  . Amlodipine Besylate   . Aspirin   . Bisoprolol-Hydrochlorothiazide   . Candesartan Cilexetil   . Carbamazepine   . Cefadroxil   . Cefdinir   . Celecoxib   . Cholestyramine   . Clonidine   . Clonidine Hydrochloride   . Codeine   . Ezetimibe   . Hydrocodone   . Hydrocodone-Acetaminophen   . Meloxicam   . Methylprednisolone Sodium Succinate   . Morphine   . Nabumetone   . Naproxen   . Niacin   . Nisoldipine   . Penicillins   . Pseudoephedrine-Guaifenesin   . Risedronate Sodium   . Rofecoxib   . Sulfonamide Derivatives   . Telithromycin   . Telmisartan   . Terfenadine   . Trandolapril-Verapamil Hcl   . Verapamil     Objective:   Physical Exam Blood pressure 130/80, pulse 72, temperature 98.2 F (36.8 C), temperature source Oral, resp. rate 16, height 5' 7.5" (1.715 m), weight 150 lb (68.04 kg). The patient is an elderly appearing white female in no apparent distress HEENT shows arcus senilis pupils are equal round reactive to light and accommodation neck is supple lung fields were clear to auscultation and percussion heart examination revealed a regular rate and rhythm with a 1/6 systolic murmur abdomen is soft and nontender with bowel  sounds present all 4 quadrants extremity examination reveals trace edema neurologically she is oriented to person place and time but equal grips       Assessment & Plan:

## 2010-09-02 ENCOUNTER — Encounter: Payer: Self-pay | Admitting: Internal Medicine

## 2010-09-02 ENCOUNTER — Ambulatory Visit (INDEPENDENT_AMBULATORY_CARE_PROVIDER_SITE_OTHER): Payer: Medicare Other | Admitting: Internal Medicine

## 2010-09-02 VITALS — BP 130/62 | HR 62 | Ht 68.0 in | Wt 153.8 lb

## 2010-09-02 DIAGNOSIS — J309 Allergic rhinitis, unspecified: Secondary | ICD-10-CM

## 2010-09-02 DIAGNOSIS — R51 Headache: Secondary | ICD-10-CM

## 2010-09-02 DIAGNOSIS — R05 Cough: Secondary | ICD-10-CM

## 2010-09-02 DIAGNOSIS — R059 Cough, unspecified: Secondary | ICD-10-CM

## 2010-09-02 NOTE — Progress Notes (Signed)
  Subjective:    Patient ID: [name], female    DOB: [birth year], 76 y.o.   MRN: [id number]  HPI 21 yoF followed for allergies and asthma, complicated by a very long list of med intolerances. She stays in to avoid pollen. She remembers local reactions causing her to stop allergy shots years ago. Usually any discomfort is in right side of her face with pressure and soreness, but never on the left and with no nasal discharge,  wheeze or cough. She really likes Astepro.    Review of Systems See HPI Constitutional:   No weight loss, night sweats,  Fevers, chills, fatigue, lassitude. HEENT:   No headaches,  Difficulty swallowing,  Tooth/dental problems,  Sore throat,                No sneezing, itching, ear ache, CV:  No chest pain,  Orthopnea, PND, swelling in lower extremities, anasarca, dizziness, palpitations  GI  No heartburn, indigestion, abdominal pain, nausea, vomiting, diarrhea, change in bowel habits, loss of appetite  Resp: No shortness of breath with exertion or at rest.  No excess mucus, no productive cough,  No non-productive cough,  No coughing up of blood.  No change in color of mucus.  No wheezing.  No chest wall deformity  Skin: no rash or lesions.  GU: no dysuria, change in color of urine, no urgency or frequency.  No flank pain.  MS:  No joint pain or swelling.  No decreased range of motion.  No back pain.  Psych:  No change in mood or affect. No depression or anxiety.  No memory loss.      Objective:   Physical Exam General- Alert, Oriented, Affect-appropriate, Distress- none acute  Skin- rash-none, lesions- none, excoriation- none  Lymphadenopathy- none  Head- atraumatic  Eyes- Gross vision intact, PERRLA, conjunctivae clear secretions  Ears- normal for age - Hearing, canals, Tm  Nose- Clear, No-Septal dev - narrower on right, No- mucus, polyps, erosion, perforation   Throat- Mallampati II , mucosa clear , drainage- none, tonsils- atrophic  Neck-  flexible , trachea midline, no stridor , thyroid nl, carotid no bruit  Chest - symmetrical excursion , unlabored     Heart/CV- RRR , trace early systolic murmur aortic , no gallop  , no rub, nl s1 s2                     - JVD- none , edema- none, stasis changes- none, varices- none     Lung- clear to P&A, wheeze- none, cough- none , dullness-none, rub- none     Chest wall-  Abd- tender-no, distended-no, bowel sounds-present, HSM- no  Br/ Gen/ Rectal- Not done, not indicated  Extrem- cyanosis- none, clubbing, none, atrophy- none, strength- nl  Neuro- grossly intact to observation         Assessment & Plan:

## 2010-09-02 NOTE — Patient Instructions (Signed)
Fexofenadine/ Allegra is available over the counter without prescription at your drug store as needed.  Please call when you need refills.

## 2010-09-06 NOTE — Assessment & Plan Note (Signed)
An atopic component was defined in the past. At her age, vasomotor and nonallergic problems are more likely. She tries to avoid exposures and this is her best strategy.

## 2010-09-06 NOTE — Assessment & Plan Note (Signed)
There is usually some asymmetry in nasal/ sinus anatomy which Streater be important for her, but it comes and goes. Suspect much of this is tension or migraine, or possibly cluster, headache physiology.

## 2010-09-06 NOTE — Assessment & Plan Note (Signed)
Cough is not actively disturbing her now.

## 2010-09-16 NOTE — Op Note (Signed)
[name], [name] Wateen                     ACCOUNT NO.:  [id number]   MEDICAL RECORD NO.:  [id number]          PATIENT TYPE:   LOCATION:                                 FACILITY:   PHYSICIAN:  Erasmo Leventhal, M.D.DATE OF BIRTH:  [birth year]   DATE OF PROCEDURE:  10/06/2006  DATE OF DISCHARGE:                               OPERATIVE REPORT   PREOPERATIVE DIAGNOSIS:  Left knee probable torn medial and lateral  meniscus with osteoarthritis.   POSTOPERATIVE DIAGNOSES:  1. Left knee tears of medial and lateral menisci with grade 4      chondromalacia patella.  2. Osteoarthrosis.   PROCEDURES:  1. Left knee arthroscopic partial medial meniscectomy.  2. Partial lateral meniscectomy.  3. Chondroplasty of patella.   SURGEON:  Erasmo Leventhal, M.D.   ANESTHESIA:  Local with monitored anesthesia care.   ESTIMATED BLOOD LOSS:  Less than 10 mL.   DRAINS:  None.   COMPLICATIONS:  None.   TOURNIQUET TIME:  None.   DISPOSITION:  PACU stable.   OPERATIVE DETAILS:  The patient was cancelled in the holding area,  correct side was identified, the chart was signed appropriately, taken  to the operating room, IV antibiotics were given.  The lower extremity  was placed on monitored anesthesia care, blockade had been administered  in the holding area.  With the patient in a thigh older, prepped with  DuraPrep, draped in a sterile fashion.  Arthroscopic portals were  infiltrated with 20 mL of 1% lidocaine with epinephrine.  Proximal  medial, anteromedial and anterolateral portals were established.  Diagnostic arthroscopy was undertaken.  She had grade 4 chondromalacia  on two-thirds of patella __________ periphery.  The suprapatellar pouch,  medial gutters unremarkable.  The patellofemoral joint revealed normal  articular cartilage otherwise in the femoral trochlea.  Arthrosis was  only on the patella.  The ACL and PCL were intact, medial side was  inspected, degenerative type tear  posterior horn medial meniscus with a  large flap.  Utilizing __________ motorized shaver, a partial  meniscectomy performed, bladder __________ gently coagulated the  periphery of the meniscus.  I very careful to touch the meniscus only  and not the articular cartilage.  Medial articular cartilage showed  minimal softening.  Lateral side was inspected and unfortunately there  was extensive degenerative tearing of the anterior two-thirds lateral  meniscus which was completely repaired with a motorized shaver.  A  subtotal partial lateral meniscectomy was performed to provide a stable  base __________.  Hemostasis obtained and the edges were smoothed down  nicely with the arthroscope __________ system.  There was some mild  grade 3 chondromalacia lateral compartment and a chondroplasty performed  of the lateral tibial plateau.  The knee was sequentially reinspected,  irrigated __________ removed.  The 3 portals were closed using nylon  suture.  Marcaine with epinephrine, 20 mL, 0.25% was injected in the  knee joint at the end of the case to help with postoperative pain.  A  sterile dressing applied to the knee.  An ice pack was  applied.  She was  taken to the operating room and PACU in stable condition.  No  complications or problems.   PLAN:  Stabilization in the PACU and outpatient area and discharged to  home.           ______________________________  Erasmo Leventhal, M.D.     RAC/MEDQ  D:  10/07/2006  T:  10/07/2006  Job:  [id number]

## 2010-09-16 NOTE — Assessment & Plan Note (Signed)
[facility]                             PULMONARY OFFICE NOTE   NAME:[name], [name]                            MRN:          [id number]  DATE:03/07/2007                            DOB:          27-Aug-1929    PROBLEM:  1. Allergic rhinitis.  2. Vasomotor rhinitis.  3. Chronic facial pain/sinusitis versus trigeminal neuralgia.  4. Hypertension.  5. History of melanoma.   HISTORY:  She says Astelin has been the best medicine she has tried for  her nasal congestion and pressure discomfort, occasionally she will use  Afrin.  Blames allergies because some days she has watery rhinorrhea and  persistent drainage causes sores just inside her nostrils.  Occasional  painful, aching, right retroorbital pressure still occurs, pattern is  not different.  She tries Allegra D.  Had a melanoma resected from her  sternum.  She brings her medication list which is reviewed and charted.   OBJECTIVE:  Weight 148 pounds, BP 118/70, pulse 71, room air saturation  99%.  There is mild turbinate edema.  Bags under the eyes without  significant retroorbital edema or conjunctival injection.  Nasal airway  is not obstructed.  Palate is clear.  Lungs clear.  Voice quality  normal.   IMPRESSION:  Chronic facial pain syndrome [place] include some trigeminal  neuralgia and vasomotor rhinitis along with an allergic rhinitis.   PLAN:  1. She is going to try ipratropium 0.06^% nasal spray to see what      effect that has.  2. Schedule return in 6 months.     Clinton D. Maple Hudson, MD, Tonny Bollman, FACP  Electronically Signed    CDY/MedQ  DD: 03/12/2007  DT: 03/13/2007  Job #: [id number]   cc:   Stacie Glaze, MD

## 2010-09-19 NOTE — Op Note (Signed)
NAME:  [name], [name]                     ACCOUNT NO.:  [id number]   MEDICAL RECORD NO.:  [id number]          PATIENT TYPE:  AMB   LOCATION:  ENDO                         [facility]AC[facility]  [facility]   PHYSICIAN:  Bernette Redbird, M.D.   DATE OF BIRTH:  [birth year]   DATE OF PROCEDURE:  01/30/2004  DATE OF DISCHARGE:                                 OPERATIVE REPORT   PROCEDURE:  Colonoscopy.   INDICATIONS:  Family history of colon cancer in 2 first degree relatives  (father at age 28 and brother in his 56s), plus a personal history of a  small colonic adenoma having been removed 2 years ago.   FINDINGS:  Normal exam.   PROCEDURE:  The major purpose and risks of the procedure were familiar to  the patient from prior examination, and she provided written consent.  Sedation was fentanyl 87.5 mcg and Versed 9 mg IV without arrhythmias or  desaturation. The Olympus adjustable tension pediatric video colonoscope was  advanced with moderate difficulty through a rather spastic sigmoid region,  then with some external abdominal compression with the patient in the supine  position to the base of the cecum as identified by visualization of the  appendiceal orifice, after which pull back was performed. The quality of the  prep was very good, and it was felt that all areas were well seen.   No polyps, cancer, colitis, vascular malformations, or diverticular disease  were observed, and retroflexion of the rectum was unremarkable. No biopsies  were obtained. The patient tolerated the procedure well, and there were no  apparent complications.   IMPRESSION:  Normal colonoscopy in a patient with a prior history of colon  polyps and a family history of colon cancer (V12.72).   PLAN:  Follow up colonoscopy in 3 years, possibly under Diprivan sedation  since she had a fair amount discomfort despite generous amounts of IV  sedation today. This Minus in part be due to the outpatient use of Xanax.      RB/MEDQ  D:   01/30/2004  T:  01/30/2004  Job:  [id number]   cc:   Stacie Glaze, M.D. [facility]

## 2010-09-19 NOTE — Op Note (Signed)
   NAME:  [name], [name]                               ACCOUNT NO.:  [id number]   MEDICAL RECORD NO.:  [id number]                   PATIENT TYPE:  AMB   LOCATION:  ENDO                                 FACILITY:  [facility]   PHYSICIAN:  Florencia Reasons, M.D.             DATE OF BIRTH:  November 08, 1929   DATE OF PROCEDURE:  01/04/2002  DATE OF DISCHARGE:                                 OPERATIVE REPORT   PROCEDURE PERFORMED:  Colonoscopy with biopsies.   ENDOSCOPIST:  Florencia Reasons, M.D.   INDICATIONS FOR PROCEDURE:  Family history of colon cancer in a 77 year old  female whose last colonoscopy, five years ago, was negative.   FINDINGS:  Diminutive cecal polyp.   DESCRIPTION OF PROCEDURE:  The nature, purpose and risks of the procedure  were familiar to the patient from prior examination and she provided written  consent.  Sedation was fentanyl and Versed 8 mg IV without arrhythmias  or desaturation.   The Olympus adjustable tension pediatric video colonoscope was advanced  without significant difficulty to the cecum and for a short distance into a  normal-appearing terminal ileum, using some external abdominal compression  to control looping.  Pullback was then performed.  The quality of the prep  was excellent and it is felt that all areas were well seen.   There was a 2 mm sessile polyp in the cecum removed by a single cold biopsy.  No other polyps were seen.  There was no evidence of cancer, colitis or  vascular malformations or diverticulosis.  Retroflexion in the rectum as  well as reinspection of the rectosigmoid was unremarkable.   The patient tolerated the procedure well and there were no apparent  complications.   IMPRESSION:  Diminutive colonic polyp in a patient with a family history of  colon cancer.    PLAN:  Await pathology on the polyp.  Anticipate colonoscopic follow-up in  five years in view of the family history.           Florencia Reasons, M.D.    RVB/MEDQ  D:  01/04/2002  T:  01/04/2002  Job:  [id number]   cc:   Stacie Glaze, M.D. [facility]   Veverly Fells. Altheimer, M.D.  [address]  [place]  [place] [address]  Fax: [phone]

## 2010-09-19 NOTE — Assessment & Plan Note (Signed)
[facility]                               PULMONARY OFFICE NOTE   NAME:[name]                            MRN:          [id number]  DATE:12/14/2005                            DOB:          [birth year]    PROBLEMS:  1. Allergic rhinitis.  2. Vasomotor rhinitis.  3. Chronic facial pains/sinusitis versus trigeminal neuralgia.   HISTORY:  Three-month followup.  She says she was doing much better through  the summer until two or three days ago when she began again her pattern of  right-sided nasal congestion, itching, sneezing, some periorbital pressure  with scant nonpurulent discharge.  No real pain or fever.   MEDICATIONS:  Her list is charted and reviewed.  She continues the B12 shot,  Starlix, Glucophage, Allegra D 24 when needed, [place], calcium, Catapres-TTS  0.1, Patanol one drop daily, furosemide 30 mg, alprazolam, Rhinocort,  Astelin.   OBJECTIVE:  VITAL SIGNS:  Weight 152 pounds which is down about 6 pounds if  measured correctly since Wahlstrom.  Blood pressure 126/70, pulse regular at 58,  room air saturation 99%.  HEENT:  There is turbinate edema, right greater than left, and mild nasal  septal deviation without frank obstruction and without erythema or postnasal  drainage.  Her throat looks clear.  LUNGS:  Clear.  HEART:  Heart sounds regular.   IMPRESSION:  Rhinitis and nasal congestion.   PLAN:  1. Neo-Synephrine inhalation nasal.  2. Depo-Medrol 80 mg IM with steroid talk.  3. Schedule return in four months, early p.r.n.                                   Clinton D. Maple Hudson, MD, FCCP, FACP   CDY/MedQ  DD:  12/14/2005  DT:  12/15/2005  Job #:  [id number]

## 2010-09-19 NOTE — Assessment & Plan Note (Signed)
[facility]                             PULMONARY OFFICE NOTE   NAME:[name], [name]                            MRN:          [id number]  DATE:09/06/2006                            DOB:          07/06/1929    PROBLEM:  1. Allergic rhinitis.  2. Vasomotor rhinitis.  3. Chronic facial pain/sinusitis versus trigeminal neuralgia.   HISTORY:  She says she is doing quite well on followup, except she  blames the Spring pollen for itching eyes and increased pressure in the  right maxillary area, which is usually uncomfortable for her.  We  discussed her remote use of allergy vaccine.  She uses occasional Afrin  or Astelin mostly this time of year.  Allegra D over dries her.  Occasional sneeze.  Sensitive to non-specific odors.   OBJECTIVE:  Weight 152 pounds, blood pressure 118/68, pulse regular 59,  room air saturation 99%.  There is mild turbinate edema, bilateral  periorbital edema without conjunctival injections, speech does not sound  nasal and there is no visible post nasal drip or cervical adenopathy.  Heart sounds are regular without murmur.  Lungs sound clear.   IMPRESSION:  Allergic rhinitis, exacerbation of her chronic facial pain.  There is probably a component of vasomotor rhinitis.   PLAN:  1. Try decongestion Sudafed-PE as discussed.  2. Schedule return in 6 months, earlier p.r.n.     Clinton D. Maple Hudson, MD, Tonny Bollman, FACP  Electronically Signed    CDY/MedQ  DD: 09/06/2006  DT: 09/07/2006  Job #: [id number]

## 2010-09-19 NOTE — Op Note (Signed)
[name], [name]                     ACCOUNT NO.:  [id number]   MEDICAL RECORD NO.:  [id number]          PATIENT TYPE:  AMB   LOCATION:  ENDO                         FACILITY:  [facility]   PHYSICIAN:  Bernette Redbird, M.D.   DATE OF BIRTH:  04/07/1930   DATE OF PROCEDURE:  06/29/2006  DATE OF DISCHARGE:  06/24/2006                               OPERATIVE REPORT   PROCEDURE:  Colonoscopy.   INDICATIONS:  77 year old female with a very strong family history of  colon cancer, including her brother at age 71 and her sister at age 58.  The patient, herself, had a small adenomatous polyp removed about four  years ago, with her last surveillance colonoscopy having been 2 1/2  years ago.   FINDINGS:  Normal exam to the cecum.   PROCEDURE:  The nature, purpose, and risks of the procedure were  familiar to the patient from prior examinations and she provided written  consent.  Sedation was fentanyl and Diprivan by the anesthesia  department.  There was no clinical instability during the course of her  procedure.   The Pentax pediatric video colonoscope was advanced to the cecum as  identified by visualization of the ileocecal valve, the appendiceal  orifice, and transillumination of the deep right lower quadrant.  Pullback was then performed.  The quality of the prep was excellent and  it is felt that all areas were adequately seen.  The exam was somewhat  difficult due to a moderate amount of looping which was overcome by  having the patient in the supine position and applying external  abdominal pressure.  The sigmoid area, in addition, was somewhat fixated  and difficult to traverse.   This was a normal examination.  No polyps, cancer, colitis, vascular  malformations or diverticular disease were observed and retroflexion in  the rectum and reinspection of the rectum were unremarkable.  No  biopsies were obtained.  The patient tolerated the procedure well and  there were no apparent  complications.   IMPRESSION:  Normal colonoscopy in a patient with a family history of  colon cancer (V16.0).   PLAN:  Repeat colonoscopy in 2-1/2 years under Diprivan sedation.           ______________________________  Bernette Redbird, M.D.     RB/MEDQ  D:  06/29/2006  T:  06/29/2006  Job:  [id number]   cc:   Veverly Fells. Altheimer, M.D.  Fax: [phone]   Stacie Glaze, MD  [address] [place]  [place] [address]

## 2010-09-19 NOTE — Consult Note (Signed)
. [facility]  Patient:    [name], [name]                    MRN: [id number] Attending:  Janetta Hora. Gelene Mink, M.D. CC:         Anesthesia Department                          Consultation Report  ADDENDUM:  I received a phone call via my office from the patient complaining of what she told my office staff was mouth pain.  The record for the patient was obtained at [facility] and I reviewed the chart and telephoned the patient on April 30 at approximately 2:30 in the afternoon.  I discussed with the patient her concerns for her mouth pain.  The best summarization that I can make is that she had some disruption of her temporomandibular joint in the perioperative period.  Review of the record from March 15 indicates no complaints of this kind voiced y the patient during her postoperative period.  The intraoperative record indicates an easy oral intubation without any difficulty performed by the CRNA.  I was present at the induction and monitored the induction of anesthesia and the securing of the airway and checked back during the case.  The patient had a complaint of  pruritus in the postoperative period and some nausea, which was treated and documented in the post anesthesia care unit.  There was no other evidence of injury of the TMJ in the record.  Our conversation revolved around the necessity for intubation and the fact that we followed the normal protocols.  She seemed somewhat disagreeable about he procedure and seemed to have some indication from Dr. Kendrick Ranch that we had used some sort of clamp on her mouth and that we had opened her mouth excessively.  I did my best to diffuse some of these impressions that she had from Dr. Earlene Plater and tried to explain to her the progression of temporomandibular joint disorders and the need to probably have some evaluation if she saw fit.  It happens that today she feels hat the mouth pain is  gone, that she seems to have had a realignment of her jaw at ome point in the last day or so.  She is a little unclear about exactly how this occurred.  She states that she feels fine at the moment.  I assured her that I would be available to discuss it with her at any time.  I tried to impress upon her that, if she needs to discuss this episode if she would require future anesthetics, and she seemed to understand the need for that.  I have assured her that, at the time of subsequent anesthetics, that she would have her options discussed as to  what would be the safest and most comfortable way to secure her airway. DD:  09/01/99 TD:  09/02/99 Job: [id number]

## 2010-09-19 NOTE — Op Note (Signed)
[facility]  Patient:    BREELLE, HOLLYWOOD                      MRN: [id number] Proc. Date: 07/17/99 Adm. Date:  [id number] Attending:  Carson Myrtle                           Operative Report  PREOPERATIVE DIAGNOSIS:  Chronic cholecystolithiasis.  POSTOPERATIVE DIAGNOSIS:  Chronic cholecystolithiasis.  PROCEDURE:  Laparoscopic cholecystectomy.  SURGEON:  Timothy E. Earlene Plater, M.D.  ASSISTANT:  Gita Kudo, M.D.  ANESTHESIA:  CRNA supervised M.D.  INDICATIONS:  Ms. Angerer is 77 years old.  She takes a number of medications.  She is a diabetic on p.o. control and has enumerable allergies.  She has chronic cholecystolithiasis with food intolerance, right upper quadrant pain, and occasional nausea and vomiting.  After appropriate workup, normal liver function tests, and ultrasound, she elects to proceed with surgery.  DESCRIPTION OF PROCEDURE:  The patient was brought to the operating room and placed supine.  General endotracheal anesthesia administered.  The abdomen was scrubbed, prepped and draped in the usual fashion.  An infraumbilical incision was made. The fascia was identified and opened.  The peritoneum was identified and opened.  A  number of soft, fatty adhesions of the omentum were gently swept away.  The Hasson catheter was placed, tied in place, and the abdomen insufflated. Peritoneoscopy was limited to the right upper quadrant because of adhesions.  We did not attempt to take down any of the additional adhesions.  A second 10 mm trocar was placed in the midepigastrium, two 5 mm trocars in the right upper quadrant.  Each trocar ite had been injected with Marcaine prior to insertion.  The gallbladder was a little thickened and contained a number of adhesions.  It was grasped at the dome and retracted, the adhesions taken down.  Then appropriately retracted and carefully dissection at the base of the gallbladder  revealed a single normal appearing cystic duct entering the gallbladder.  This was dissected out, triply clipped and divided. Likewise an artery entering the gallbladder was dissected out, triply clipped, nd divided.  The gallbladder was then removed from the gallbladder bed without incident or complication.  The gallbladder bed was dry.  There was no evidence f bile or blood leak.  The irrigation was clear.  The gallbladder was then removed through the infraumbilical incision without difficulty.  That incision was tied and closed.  Irrigation was carried out.  Again it was clear.  All irrigation, CO2,  instruments, and trocars were removed.  The skin incisions were closed with 4-0  Monocryl.  Steri-Strips were applied.  Sponge, needle, and instrument counts were correct.  She tolerated it well and she was removed to the recovery room in good condition. DD:  07/17/99 TD:  07/17/99 Job: [id number]

## 2010-09-19 NOTE — Assessment & Plan Note (Signed)
[facility]                             PULMONARY OFFICE NOTE   NAME:[name]                            MRN:          [id number]  DATE:05/07/2006                            DOB:          1929/08/19    PROBLEM LIST:  1. Allergic rhinitis.  2. Vasomotor rhinitis.  3. Chronic facial pain/sinusitis versus trigeminal neuralgia.   HISTORY:  Bothered mostly that the cold outside air hurts the bones in  her face, indicating the maxillary areas bilaterally.  She asks about  using mineral oil nose drops, which she remembers from the 1960s.  I  discussed the problems with mineral oil in the airway and suggested  nasal saline gel instead.  Has noted some dry cough if she is around any  respiratory irritants such as smokes and perfumes.  She says that she  switched off of Tarka for a while, but that did not make any difference  and she is back on it.  Mostly, she is bothered by a dry nasal feeling.  She is using Astelin twice a day, and I explained that this might easily  over dry her, but she insists that she is better off with it than  without it.  No nosebleeds.  No wheezing.   MEDICATIONS:  1. Starlix 120 mg t.i.d.  2. Glucophage XR 500 mg t.i.d.  3. Omacor 1 gm b.i.d.  4. Calcium 1000 mg with vitamin D b.i.d.  5. Multivitamins.  6. Niacin 500 mg.  7. Furosemide 30 mg.  8. Vitamin B12 shot every 3 weeks.  9. Boniva once a month.  10.Astelin used b.i.d.  11.Red wine pill.  12.Tarka 1/2 nightly, 2/180.  13.Atenolol 25 mg.  14.Occasional use of Allegra D, Tylenol or alprazolam.  15.Has been using Rhinocort, stopped for a while and is interested in      trying it again.   OBJECTIVE:  VITAL SIGNS:  Weight 151 pounds, blood pressure 132/76,  pulse regular 64, room air saturation 100%.  Dry, atrophic nose, quiet chest, throat looks clear.  She is quite  conversational and seems in no distress. Periorbital puffiness and  bagginess.   IMPRESSION:   Rhinitis with a significant component of vasomotor rhinitis  and potential for over-drying.  Chronic facial pain probably reflects  the same issues.   PLAN:  1. Nasal saline gel to avoid over-drying.  2. Consider skipping doses of Astelin.  3. Rhinocort each nostril once daily.  4. Schedule to return in 4 months.     Clinton D. Maple Hudson, MD, Tonny Bollman, FACP  Electronically Signed    CDY/MedQ  DD: 05/07/2006  DT: 05/07/2006  Job #: [id number]

## 2010-09-19 NOTE — Op Note (Signed)
NAME:  [name], Yolanda                     ACCOUNT NO.:  [id number]   MEDICAL RECORD NO.:  [id number]          PATIENT TYPE:  AMB   LOCATION:  ENDO                         [facility]AC[facility]   PHYSICIAN:  Bernette Redbird, M.D.   DATE OF BIRTH:  06/18/1929   DATE OF PROCEDURE:  06/24/2006  DATE OF DISCHARGE:                               OPERATIVE REPORT   PROCEDURE:  Colonoscopy.   INDICATION:  A 77 year old female with a very strong family history of  colon cancer, including her brother at age 64 and her sister at age 57.  The patient herself had a small adenomatous polyp removed approximately  4 years ago.  The last surveillance colonoscopy was 2-1/2 years ago.   FINDINGS:  Normal exam to the cecum.   DESCRIPTION OF PROCEDURE:  The nature, purpose, and risks of the  procedure were familiar to the patient from prior examination, and she  provided written consent.  Sedation was fentanyl and Diprivan by the  anesthesia department.  There was no clinical instability during the  course of her procedure.   The Pentax pediatric video colonoscope was advanced to the cecum as  identified by visualization of the ileocecal valve, the appendiceal  orifice, and transillumination of a deep right lower quadrant.  Pullback  was then performed.  The quality of the prep was excellent, and it is  felt that all areas were adequately seen.   There was a moderate amount of looping overcome by having the patient in  the supine position and apply external abdominal compression.  The  sigmoid area was also a little bit fixated and difficult to get through.   This was a normal examination.  No.  Dictation ends at this point.           ______________________________  Bernette Redbird, M.D.     RB/MEDQ  D:  06/24/2006  T:  06/24/2006  Job:  [id number]   cc:   Veverly Fells. Altheimer, M.D.  Fax: [phone]   Stacie Glaze, MD  [address] [place]  [place] [address]

## 2010-09-25 ENCOUNTER — Ambulatory Visit
Admission: RE | Admit: 2010-09-25 | Discharge: 2010-09-25 | Disposition: A | Discharge status: Home / Self Care | Payer: Medicare Other | Source: Ambulatory Visit | Attending: Internal Medicine | Admitting: Internal Medicine

## 2010-09-25 DIAGNOSIS — Z1231 Encounter for screening mammogram for malignant neoplasm of breast: Secondary | ICD-10-CM

## 2010-09-30 ENCOUNTER — Telehealth: Payer: Self-pay | Admitting: Internal Medicine

## 2010-09-30 NOTE — Telephone Encounter (Signed)
Pt needs blood work results °

## 2010-09-30 NOTE — Telephone Encounter (Signed)
Pt informed wnl and stable

## 2010-11-06 ENCOUNTER — Telehealth: Payer: Self-pay | Admitting: Internal Medicine

## 2010-11-06 MED ORDER — SITAGLIPTIN PHOS-METFORMIN HCL 50-1000 MG PO TABS: 1.0000 | ORAL_TABLET | Freq: Two times a day (BID) | ORAL | Status: DC

## 2010-11-06 NOTE — Telephone Encounter (Signed)
Refill Janumet 50-1000mg . [facility] at ---[phone].

## 2010-11-06 NOTE — Telephone Encounter (Signed)
Sent in

## 2010-11-13 ENCOUNTER — Ambulatory Visit (INDEPENDENT_AMBULATORY_CARE_PROVIDER_SITE_OTHER): Payer: Medicare Other | Admitting: Internal Medicine

## 2010-11-13 ENCOUNTER — Encounter: Payer: Self-pay | Admitting: Internal Medicine

## 2010-11-13 VITALS — BP 132/80 | HR 76 | Temp 98.2°F | Resp 16 | Ht 68.0 in | Wt 150.0 lb

## 2010-11-13 DIAGNOSIS — D649 Anemia, unspecified: Secondary | ICD-10-CM

## 2010-11-13 DIAGNOSIS — E119 Type 2 diabetes mellitus without complications: Secondary | ICD-10-CM

## 2010-11-13 DIAGNOSIS — I10 Essential (primary) hypertension: Secondary | ICD-10-CM

## 2010-11-13 LAB — BASIC METABOLIC PANEL
BUN: 11 mg/dL (ref 6–23)
CO2: 26 mEq/L (ref 19–32)
Calcium: 9.2 mg/dL (ref 8.4–10.5)
Chloride: 101 mEq/L (ref 96–112)
Creatinine, Ser: 0.9 mg/dL (ref 0.4–1.2)
GFR: 65.94 mL/min (ref >60.00)
Glucose, Bld: 135 mg/dL — ABNORMAL HIGH (ref 70–99)
Potassium: 4.7 mEq/L (ref 3.5–5.1)
Sodium: 138 mEq/L (ref 135–145)

## 2010-11-13 LAB — HEMOGLOBIN A1C: Hgb A1c MFr Bld: 7.4 % — ABNORMAL HIGH (ref 4.6–6.5)

## 2010-11-13 NOTE — Patient Instructions (Signed)
Consider [place] [facility] on [address] by the [employer] for a pedicure to soften your calluses

## 2010-12-05 NOTE — Progress Notes (Signed)
Subjective:    Patient ID: [name], female    DOB: 10/21/1929, 77 y.o.   MRN: [id number]  HPI Complex patient presents for blood pressure control.  Review her allergy symptoms are significant. The treatment of this patient however she is tolerant of her current blood pressure medicine regimen and has been following that regimen more closely than previously.  She has chronic diarrhea which he associates with the medication which Hafen well be irritable bowel but could be secondary to verapamil she says this is controlled with current medications and she can tolerate the verapamil.     Review of Systems  Constitutional: Negative for activity change, appetite change and fatigue.  HENT: Negative for ear pain, congestion, neck pain, postnasal drip and sinus pressure.   Eyes: Negative for redness and visual disturbance.  Respiratory: Negative for cough, shortness of breath and wheezing.   Gastrointestinal: Negative for abdominal pain and abdominal distention.  Genitourinary: Negative for dysuria, frequency and menstrual problem.  Musculoskeletal: Negative for myalgias, joint swelling and arthralgias.  Skin: Negative for rash and wound.  Neurological: Negative for dizziness, weakness and headaches.  Hematological: Negative for adenopathy. Does not bruise/bleed easily.  Psychiatric/Behavioral: Negative for sleep disturbance and decreased concentration.   Past Medical History  Diagnosis Date  . Seasonal allergies   . Diabetes mellitus   . HTN (hypertension)   . Osteoarthritis   . Palpitations   . Anemia   . History of colonic polyps   . GERD (gastroesophageal reflux disease)   . Hyperlipidemia   . Chronic facial pain   . Vitamin B12 deficiency   . Dyslipidemia   . Chronic foot pain   . Chronic cough   . Obesity    Past Surgical History  Procedure Date  . Colonoscopy   . Polypectomy     small adenomatous    reports that she has never smoked. She does not have any smokeless  tobacco history on file. She reports that she does not drink alcohol or use illicit drugs. family history includes Colon cancer in an unspecified family member; Diabetes in her father; and Heart disease in her mother. Allergies  Allergen Reactions  . Amlodipine Besylate   . Aspirin   . Bisoprolol-Hydrochlorothiazide   . Candesartan Cilexetil   . Carbamazepine   . Cefadroxil   . Cefdinir   . Celecoxib   . Cholestyramine   . Clonidine   . Clonidine Hydrochloride   . Codeine   . Ezetimibe   . Hydrocodone   . Hydrocodone-Acetaminophen   . Meloxicam   . Methylprednisolone Sodium Succinate   . Morphine   . Nabumetone   . Naproxen   . Niacin   . Nisoldipine   . Penicillins   . Pseudoephedrine-Guaifenesin   . Risedronate Sodium   . Rofecoxib   . Sulfonamide Derivatives   . Telithromycin   . Telmisartan   . Terfenadine   . Trandolapril-Verapamil Hcl   . Verapamil         Objective:   Physical Exam  Vitals reviewed. Constitutional: She is oriented to person, place, and time. She appears well-developed and well-nourished. No distress.  HENT:  Head: Normocephalic and atraumatic.  Right Ear: External ear normal.  Left Ear: External ear normal.  Nose: Nose normal.  Mouth/Throat: Oropharynx is clear and moist.  Eyes: Conjunctivae and EOM are normal. Pupils are equal, round, and reactive to light.  Neck: Normal range of motion. Neck supple. No JVD present. No tracheal deviation present. No  thyromegaly present.  Cardiovascular: Normal rate, regular rhythm, normal heart sounds and intact distal pulses.   No murmur heard. Pulmonary/Chest: Effort normal and breath sounds normal. She has no wheezes. She exhibits no tenderness.  Abdominal: Soft. Bowel sounds are normal.  Musculoskeletal: Normal range of motion. She exhibits no edema and no tenderness.  Lymphadenopathy:    She has no cervical adenopathy.  Neurological: She is alert and oriented to person, place, and time. She has  normal reflexes. No cranial nerve deficit.  Skin: Skin is warm and dry. She is not diaphoretic.  Psychiatric: She has a normal mood and affect. Her behavior is normal.          Assessment & Plan:  We reviewed with the patient's the conclusions of her endocrinologist that she sees for control of her diabetes.  We are in agreement with current plan her blood pressures well controlled on the current combination of amlodipine and a beta blocker she does not have significant bradycardia from the combination of these medications.  She does require a diuretic for blood pressure control as well because of her current compliance with her medications her blood pressure is well-controlled.  Monitoring of basic metabolic panel potassium and renal function

## 2011-02-09 ENCOUNTER — Other Ambulatory Visit: Payer: Self-pay | Admitting: *Deleted

## 2011-02-09 MED ORDER — CYANOCOBALAMIN 1000 MCG/ML IJ SOLN: 1000.0000 ug | INTRAMUSCULAR | Status: DC

## 2011-02-12 ENCOUNTER — Ambulatory Visit: Payer: Medicare Other | Admitting: Internal Medicine

## 2011-02-13 ENCOUNTER — Ambulatory Visit (INDEPENDENT_AMBULATORY_CARE_PROVIDER_SITE_OTHER): Payer: Medicare Other | Admitting: Internal Medicine

## 2011-02-13 ENCOUNTER — Encounter: Payer: Self-pay | Admitting: Internal Medicine

## 2011-02-13 VITALS — BP 130/70 | HR 72 | Temp 97.9°F | Resp 16 | Ht 68.0 in | Wt 152.0 lb

## 2011-02-13 DIAGNOSIS — D519 Vitamin B12 deficiency anemia, unspecified: Secondary | ICD-10-CM

## 2011-02-13 DIAGNOSIS — K589 Irritable bowel syndrome without diarrhea: Secondary | ICD-10-CM

## 2011-02-13 DIAGNOSIS — T887XXA Unspecified adverse effect of drug or medicament, initial encounter: Secondary | ICD-10-CM

## 2011-02-13 DIAGNOSIS — I10 Essential (primary) hypertension: Secondary | ICD-10-CM

## 2011-02-13 DIAGNOSIS — E119 Type 2 diabetes mellitus without complications: Secondary | ICD-10-CM

## 2011-02-13 DIAGNOSIS — D518 Other vitamin B12 deficiency anemias: Secondary | ICD-10-CM

## 2011-02-13 DIAGNOSIS — E785 Hyperlipidemia, unspecified: Secondary | ICD-10-CM

## 2011-02-13 LAB — BASIC METABOLIC PANEL
BUN: 12 mg/dL (ref 6–23)
CO2: 27 mEq/L (ref 19–32)
Calcium: 9.1 mg/dL (ref 8.4–10.5)
Chloride: 105 mEq/L (ref 96–112)
Creatinine, Ser: 0.9 mg/dL (ref 0.4–1.2)
GFR: 61.83 mL/min (ref >60.00)
Glucose, Bld: 207 mg/dL — ABNORMAL HIGH (ref 70–99)
Potassium: 4 mEq/L (ref 3.5–5.1)
Sodium: 140 mEq/L (ref 135–145)

## 2011-02-13 LAB — HEPATIC FUNCTION PANEL
ALT: 24 U/L (ref 0–35)
AST: 25 U/L (ref 0–37)
Albumin: 3.6 g/dL (ref 3.5–5.2)
Alkaline Phosphatase: 42 U/L (ref 39–117)
Bilirubin, Direct: 0.1 mg/dL (ref 0.0–0.3)
Total Bilirubin: 0.5 mg/dL (ref 0.3–1.2)
Total Protein: 6.2 g/dL (ref 6.0–8.3)

## 2011-02-13 LAB — LIPID PANEL
Cholesterol: 139 mg/dL (ref 0–200)
HDL: 41.6 mg/dL (ref >39.00)
LDL Cholesterol: 65 mg/dL (ref 0–99)
Total CHOL/HDL Ratio: 3
Triglycerides: 162 mg/dL — ABNORMAL HIGH (ref 0.0–149.0)
VLDL: 32.4 mg/dL (ref 0.0–40.0)

## 2011-02-13 MED ORDER — CYANOCOBALAMIN 1000 MCG/ML IJ SOLN
1000.0000 ug | INTRAMUSCULAR | Status: DC
  Administered 2011-02-13: 1000 ug via INTRAMUSCULAR

## 2011-02-13 MED ORDER — ALPRAZOLAM 0.25 MG PO TABS: 0.2500 mg | ORAL_TABLET | Freq: Every evening | ORAL | Status: DC | PRN

## 2011-02-13 NOTE — Progress Notes (Signed)
  Subjective:    Patient ID: [name], female    DOB: Aug 19, 1929, 77 y.o.   MRN: [id number]  HPI  Blood pressure monitoring  Review of Systems     Objective:   Physical Exam        Assessment & Plan:  Blood pressure check today stable blood pressure current medications continue current medications refill as necessary. Continue followup with endocrinologist for diabetes

## 2011-02-13 NOTE — Patient Instructions (Signed)
Patient was instructed to continue all medications as prescribed. To stop at the checkout desk and schedule a followup appointment  

## 2011-02-19 LAB — BASIC METABOLIC PANEL
BUN: 12
CO2: 29
Calcium: 9.6
Chloride: 110
Creatinine, Ser: 0.88
GFR calc Af Amer: >60
GFR calc non Af Amer: >60
Glucose, Bld: 83
Potassium: 3.8
Sodium: 141

## 2011-02-19 LAB — CBC
HCT: 35.8 — ABNORMAL LOW
Hemoglobin: 12.1
MCHC: 33.8
MCV: 89.4
Platelets: 278
RBC: 4
RDW: 13.3
WBC: 5.3

## 2011-02-19 LAB — PROTIME-INR
INR: 1
Prothrombin Time: 13.3

## 2011-02-19 LAB — APTT: aPTT: 32

## 2011-03-09 ENCOUNTER — Ambulatory Visit: Payer: Medicare Other | Admitting: Internal Medicine

## 2011-04-20 ENCOUNTER — Telehealth: Payer: Self-pay

## 2011-04-20 ENCOUNTER — Other Ambulatory Visit: Payer: Self-pay | Admitting: *Deleted

## 2011-04-20 MED ORDER — CYANOCOBALAMIN 1000 MCG/ML IJ SOLN: 1000.0000 ug | INTRAMUSCULAR | Status: DC

## 2011-04-20 MED ORDER — TRANDOLAPRIL-VERAPAMIL HCL ER 2-180 MG PO TBCR: 1.0000 | EXTENDED_RELEASE_TABLET | Freq: Every day | ORAL | Status: DC

## 2011-04-20 MED ORDER — METFORMIN HCL 1000 MG PO TABS: 1000.0000 mg | ORAL_TABLET | Freq: Two times a day (BID) | ORAL | Status: DC

## 2011-04-20 NOTE — Telephone Encounter (Signed)
Pt needs a refill of Tarka, Glucophage XR, and Cyanocobalamin sent to [facility] (3 month supply).  Pt will need a supply of Tarka sent to [facility] on [address] until her rx from [facility] arrives.

## 2011-04-20 NOTE — Telephone Encounter (Signed)
Done

## 2011-06-16 ENCOUNTER — Encounter: Payer: Self-pay | Admitting: Internal Medicine

## 2011-06-16 ENCOUNTER — Ambulatory Visit (INDEPENDENT_AMBULATORY_CARE_PROVIDER_SITE_OTHER): Payer: Medicare Other | Admitting: Internal Medicine

## 2011-06-16 VITALS — BP 148/82 | HR 76 | Temp 98.3°F | Ht 67.5 in | Wt 150.0 lb

## 2011-06-16 DIAGNOSIS — I10 Essential (primary) hypertension: Secondary | ICD-10-CM

## 2011-06-16 DIAGNOSIS — E1165 Type 2 diabetes mellitus with hyperglycemia: Secondary | ICD-10-CM | POA: Diagnosis not present

## 2011-06-16 DIAGNOSIS — E1169 Type 2 diabetes mellitus with other specified complication: Secondary | ICD-10-CM

## 2011-06-16 MED ORDER — SITAGLIPTIN PHOS-METFORMIN HCL 50-1000 MG PO TABS: 1.0000 | ORAL_TABLET | Freq: Two times a day (BID) | ORAL | Status: DC

## 2011-06-16 NOTE — Patient Instructions (Signed)
The patient is instructed to continue all medications as prescribed. Schedule followup with check out clerk upon leaving the clinic  

## 2011-06-16 NOTE — Progress Notes (Signed)
Subjective:    Patient ID: [name], female    DOB: 1929/09/07, 78 y.o.   MRN: [id number]  HPI The patient is a 78 year old female who is followed for hypertension and diabetes and a history of irritable bowel syndrome.  The patient has recently discontinued her diabetic medication do to some television advertisements that suggested that it might be linked to pancreatic cancer.  We reviewed the claims and those TV adds and the safety profile of the medication and reassured her that as far as we can see from the current data there is no leg between her drug and pancreatic cancer and there was a definite correlation between her ability to control her blood glucoses and to taking that medication.     Review of Systems  Constitutional: Negative for activity change, appetite change and fatigue.  HENT: Negative for ear pain, congestion, neck pain, postnasal drip and sinus pressure.   Eyes: Negative for redness and visual disturbance.  Respiratory: Negative for cough, shortness of breath and wheezing.   Gastrointestinal: Negative for abdominal pain and abdominal distention.  Genitourinary: Negative for dysuria, frequency and menstrual problem.  Musculoskeletal: Negative for myalgias, joint swelling and arthralgias.  Skin: Negative for rash and wound.  Neurological: Negative for dizziness, weakness and headaches.  Hematological: Negative for adenopathy. Does not bruise/bleed easily.  Psychiatric/Behavioral: Negative for sleep disturbance and decreased concentration.   Past Medical History  Diagnosis Date  . Seasonal allergies   . Diabetes mellitus   . HTN (hypertension)   . Osteoarthritis   . Palpitations   . Anemia   . History of colonic polyps   . GERD (gastroesophageal reflux disease)   . Hyperlipidemia   . Chronic facial pain   . Vitamin B12 deficiency   . Dyslipidemia   . Chronic foot pain   . Chronic cough   . Obesity     History   Social History  . Marital Status:  Widowed    Spouse Name: N/A    Number of Children: N/A  . Years of Education: N/A   Occupational History  . retired    Social History Main Topics  . Smoking status: Never Smoker   . Smokeless tobacco: Not on file  . Alcohol Use: No  . Drug Use: No  . Sexually Active: Yes   Other Topics Concern  . Not on file   Social History Narrative  . No narrative on file    Past Surgical History  Procedure Date  . Colonoscopy   . Polypectomy     small adenomatous    Family History  Problem Relation Age of Onset  . Colon cancer    . Heart disease Mother   . Diabetes Father     Allergies  Allergen Reactions  . Amlodipine Besylate   . Aspirin   . Bisoprolol-Hydrochlorothiazide   . Candesartan Cilexetil   . Carbamazepine   . Cefadroxil   . Cefdinir   . Celecoxib   . Cholestyramine   . Clonidine   . Clonidine Hydrochloride   . Codeine   . Ezetimibe   . Hydrocodone   . Hydrocodone-Acetaminophen   . Meloxicam   . Methylprednisolone Sodium Succinate   . Morphine   . Nabumetone   . Naproxen   . Niacin   . Nisoldipine   . Penicillins   . Pseudoephedrine-Guaifenesin   . Risedronate Sodium   . Rofecoxib   . Sulfonamide Derivatives   . Telithromycin   . Telmisartan   .  Terfenadine   . Trandolapril-Verapamil Hcl   . Verapamil     Current Outpatient Prescriptions on File Prior to Visit  Medication Sig Dispense Refill  . ALPRAZolam (XANAX) 0.25 MG tablet Take 1 tablet (0.25 mg total) by mouth at bedtime as needed.  30 tablet  5  . Azelastine HCl (ASTEPRO) 0.15 % SOLN 1 spray by Nasal route 2 (two) times daily.        . beclomethasone (QVAR) 80 MCG/ACT inhaler Inhale 2 puffs into the lungs 2 (two) times daily.        . benzonatate (TESSALON) 100 MG capsule Take 100 mg by mouth 3 (three) times daily as needed.        . calcium carbonate (OS-CAL) 600 MG TABS Take 600 mg by mouth 2 (two) times daily with a meal.        . cholecalciferol (VITAMIN D) 1000 UNITS tablet Take  1,000 Units by mouth 2 (two) times daily.        . Coenzyme Q10-Red Yeast Rice (CO Q-10 PLUS RED YEAST RICE) 60-600 MG CAPS Take 1 capsule by mouth daily.        . colestipol (COLESTID) 5 G granules Take 5 g by mouth daily.        . cyanocobalamin (,VITAMIN B-12,) 1000 MCG/ML injection Inject 1 mL (1,000 mcg total) into the muscle every 21 ( twenty-one) days.  30 mL  3  . ferrous sulfate 325 (65 FE) MG tablet Take by mouth.       . fexofenadine (ALLEGRA) 60 MG tablet Take 60 mg by mouth 2 (two) times daily.        . furosemide (LASIX) 20 MG tablet Take 20 mg by mouth. 1 qd prn weakness      . hydrOXYzine (ATARAX) 25 MG tablet Take 25 mg by mouth 3 (three) times daily as needed.        . Misc Natural Products (RED WINE EXTRACT PLUS PO) Take 1 capsule by mouth daily.       . mupirocin (BACTROBAN) 2 % ointment Apply 1 application topically 2 (two) times daily.        . nateglinide (STARLIX) 120 MG tablet Take 120 mg by mouth 3 (three) times daily before meals.        . promethazine (PHENERGAN) 25 MG tablet Take 25 mg by mouth every 6 (six) hours as needed.        . ranitidine (ZANTAC) 150 MG capsule Take 150 mg by mouth 2 (two) times daily.        . trandolapril-verapamil (TARKA) 2-180 MG per tablet Take 1 tablet by mouth daily.  30 tablet  0   Current Facility-Administered Medications on File Prior to Visit  Medication Dose Route Frequency Provider Last Rate Last Dose  . cyanocobalamin ((VITAMIN B-12)) injection 1,000 mcg  1,000 mcg Intramuscular Q30 days Carrie Mew, MD   1,000 mcg at 02/13/11 0936    BP 148/82  Pulse 76  Temp(Src) 98.3 F (36.8 C) (Oral)  Ht 5' 7.5" (1.715 m)  Wt 150 lb (68.04 kg)  BMI 23.15 kg/m2       Objective:   Physical Exam  Nursing note and vitals reviewed. Constitutional: She is oriented to person, place, and time. She appears well-developed and well-nourished. No distress.  HENT:  Head: Normocephalic and atraumatic.  Right Ear: External ear  normal.  Left Ear: External ear normal.  Nose: Nose normal.  Mouth/Throat: Oropharynx is clear and moist.  Eyes:  Conjunctivae and EOM are normal. Pupils are equal, round, and reactive to light.  Neck: Normal range of motion. Neck supple. No JVD present. No tracheal deviation present. No thyromegaly present.  Cardiovascular: Normal rate, regular rhythm, normal heart sounds and intact distal pulses.   No murmur heard. Pulmonary/Chest: Effort normal and breath sounds normal. She has no wheezes. She exhibits no tenderness.  Abdominal: Soft. Bowel sounds are normal.  Musculoskeletal: Normal range of motion. She exhibits no edema and no tenderness.  Lymphadenopathy:    She has no cervical adenopathy.  Neurological: She is alert and oriented to person, place, and time. She has normal reflexes. No cranial nerve deficit.  Skin: Skin is warm and dry. She is not diaphoretic.  Psychiatric: She has a normal mood and affect. Her behavior is normal.          Assessment & Plan:  Patient had an argument with her endocrinologist about the Janumet and metformins he stopped taking it Now her sugars are running in the 200+ range.  We discussed the recent press about the [place] and stated that this drug has no current evidence that increases the risk of pancreatic cancer.  Her blood pressure stable on her current medications her diabetes is out of control and we will monitor and A1c prior to her next visit after she resumes the medication.   I have spent more than 30 minutes examining this patient face-to-face of which over half was spent in counseling

## 2011-08-13 ENCOUNTER — Other Ambulatory Visit (INDEPENDENT_AMBULATORY_CARE_PROVIDER_SITE_OTHER): Payer: Medicare Other

## 2011-08-13 DIAGNOSIS — E1169 Type 2 diabetes mellitus with other specified complication: Secondary | ICD-10-CM | POA: Diagnosis not present

## 2011-08-13 DIAGNOSIS — E1165 Type 2 diabetes mellitus with hyperglycemia: Secondary | ICD-10-CM

## 2011-08-14 LAB — HEMOGLOBIN A1C: Hgb A1c MFr Bld: 7.3 % — ABNORMAL HIGH (ref 4.6–6.5)

## 2011-08-17 ENCOUNTER — Encounter: Payer: Self-pay | Admitting: Internal Medicine

## 2011-08-17 ENCOUNTER — Ambulatory Visit (INDEPENDENT_AMBULATORY_CARE_PROVIDER_SITE_OTHER): Payer: Medicare Other | Admitting: Internal Medicine

## 2011-08-17 VITALS — BP 146/80 | HR 72 | Temp 98.4°F | Resp 16 | Ht 67.5 in | Wt 150.0 lb

## 2011-08-17 DIAGNOSIS — R197 Diarrhea, unspecified: Secondary | ICD-10-CM

## 2011-08-17 DIAGNOSIS — K219 Gastro-esophageal reflux disease without esophagitis: Secondary | ICD-10-CM | POA: Diagnosis not present

## 2011-08-17 DIAGNOSIS — I10 Essential (primary) hypertension: Secondary | ICD-10-CM | POA: Diagnosis not present

## 2011-08-17 DIAGNOSIS — K589 Irritable bowel syndrome without diarrhea: Secondary | ICD-10-CM

## 2011-08-17 DIAGNOSIS — IMO0001 Reserved for inherently not codable concepts without codable children: Secondary | ICD-10-CM

## 2011-08-17 MED ORDER — COLESTIPOL HCL 1 G PO TABS: 2.0000 g | ORAL_TABLET | Freq: Two times a day (BID) | ORAL | Status: DC

## 2011-08-17 NOTE — Patient Instructions (Signed)
The patient is instructed to continue all medications as prescribed. Schedule followup with check out clerk upon leaving the clinic  

## 2011-10-19 ENCOUNTER — Other Ambulatory Visit: Payer: Self-pay | Admitting: Internal Medicine

## 2011-10-19 DIAGNOSIS — Z1231 Encounter for screening mammogram for malignant neoplasm of breast: Secondary | ICD-10-CM

## 2011-11-16 ENCOUNTER — Ambulatory Visit
Admission: RE | Admit: 2011-11-16 | Discharge: 2011-11-16 | Disposition: A | Discharge status: Home / Self Care | Payer: Medicare Other | Source: Ambulatory Visit | Attending: Internal Medicine | Admitting: Internal Medicine

## 2011-11-16 DIAGNOSIS — Z1231 Encounter for screening mammogram for malignant neoplasm of breast: Secondary | ICD-10-CM | POA: Diagnosis not present

## 2011-11-19 ENCOUNTER — Encounter: Payer: Self-pay | Admitting: Internal Medicine

## 2011-11-19 ENCOUNTER — Ambulatory Visit (INDEPENDENT_AMBULATORY_CARE_PROVIDER_SITE_OTHER): Payer: Medicare Other | Admitting: Internal Medicine

## 2011-11-19 VITALS — BP 136/80 | HR 76 | Temp 98.6°F | Resp 16 | Ht 67.5 in | Wt 148.0 lb

## 2011-11-19 DIAGNOSIS — E785 Hyperlipidemia, unspecified: Secondary | ICD-10-CM | POA: Diagnosis not present

## 2011-11-19 DIAGNOSIS — I10 Essential (primary) hypertension: Secondary | ICD-10-CM

## 2011-11-19 DIAGNOSIS — E119 Type 2 diabetes mellitus without complications: Secondary | ICD-10-CM

## 2011-11-19 DIAGNOSIS — T887XXA Unspecified adverse effect of drug or medicament, initial encounter: Secondary | ICD-10-CM | POA: Diagnosis not present

## 2011-11-19 LAB — HEPATIC FUNCTION PANEL
ALT: 18 U/L (ref 0–35)
AST: 23 U/L (ref 0–37)
Albumin: 3.7 g/dL (ref 3.5–5.2)
Alkaline Phosphatase: 43 U/L (ref 39–117)
Bilirubin, Direct: 0.1 mg/dL (ref 0.0–0.3)
Total Bilirubin: 0.6 mg/dL (ref 0.3–1.2)
Total Protein: 6.2 g/dL (ref 6.0–8.3)

## 2011-11-19 LAB — BASIC METABOLIC PANEL
BUN: 15 mg/dL (ref 6–23)
CO2: 25 mEq/L (ref 19–32)
Calcium: 9.5 mg/dL (ref 8.4–10.5)
Chloride: 101 mEq/L (ref 96–112)
Creatinine, Ser: 1 mg/dL (ref 0.4–1.2)
GFR: 58.78 mL/min — ABNORMAL LOW (ref >60.00)
Glucose, Bld: 189 mg/dL — ABNORMAL HIGH (ref 70–99)
Potassium: 3.6 mEq/L (ref 3.5–5.1)
Sodium: 137 mEq/L (ref 135–145)

## 2011-11-19 LAB — LIPID PANEL
Cholesterol: 153 mg/dL (ref 0–200)
HDL: 41.9 mg/dL (ref >39.00)
LDL Cholesterol: 78 mg/dL (ref 0–99)
Total CHOL/HDL Ratio: 4
Triglycerides: 165 mg/dL — ABNORMAL HIGH (ref 0.0–149.0)
VLDL: 33 mg/dL (ref 0.0–40.0)

## 2011-11-19 LAB — HEMOGLOBIN A1C: Hgb A1c MFr Bld: 7.3 % — ABNORMAL HIGH (ref 4.6–6.5)

## 2011-11-19 LAB — TSH: TSH: 2.18 u[IU]/mL (ref 0.35–5.50)

## 2011-11-19 MED ORDER — NATEGLINIDE 120 MG PO TABS: 120.0000 mg | ORAL_TABLET | Freq: Three times a day (TID) | ORAL | Status: DC

## 2011-11-19 MED ORDER — DICLOFENAC SODIUM 3 % TD GEL: 1.0000 "application " | Freq: Two times a day (BID) | TRANSDERMAL | Status: DC

## 2011-11-19 NOTE — Progress Notes (Signed)
Subjective:    Patient ID: [name], female    DOB: 1930/03/07, 78 y.o.   MRN: [id number]  HPI  Patient is 78 year old female followed for high blood pressure and for chronic irritable bowel diarrhea prone She is due monitoring today for diabetes with a hemoglobin A1c she is due monitoring of her lipid and liver for control of cholesterol as well as any side effects of medications.  She also needs a basic metabolic panel to look at her potassium as well as her renal function.  Her blood pressure is stable on her current medications for IBS is stable with the use of the Colestid.  She has kept her weight and check her vital signs are all normal she denies any chest pain abnormal shortness of breath or swelling of extremities  Review of Systems  Constitutional: Negative for activity change, appetite change and fatigue.  HENT: Negative for ear pain, congestion, neck pain, postnasal drip and sinus pressure.   Eyes: Negative for redness and visual disturbance.  Respiratory: Negative for cough, shortness of breath and wheezing.   Gastrointestinal: Negative for abdominal pain and abdominal distention.  Genitourinary: Negative for dysuria, frequency and menstrual problem.  Musculoskeletal: Negative for myalgias, joint swelling and arthralgias.  Skin: Negative for rash and wound.  Neurological: Negative for dizziness, weakness and headaches.  Hematological: Negative for adenopathy. Does not bruise/bleed easily.  Psychiatric/Behavioral: Negative for disturbed wake/sleep cycle and decreased concentration.    The patient is instructed to continue all medications as prescribed. Schedule followup with check out clerk upon leaving the clinic Past Medical History  Diagnosis Date  . Seasonal allergies   . Diabetes mellitus   . HTN (hypertension)   . Osteoarthritis   . Palpitations   . Anemia   . History of colonic polyps   . GERD (gastroesophageal reflux disease)   . Hyperlipidemia   .  Chronic facial pain   . Vitamin B12 deficiency   . Dyslipidemia   . Chronic foot pain   . Chronic cough   . Obesity     History   Social History  . Marital Status: Widowed    Spouse Name: N/A    Number of Children: N/A  . Years of Education: N/A   Occupational History  . retired    Social History Main Topics  . Smoking status: Never Smoker   . Smokeless tobacco: Not on file  . Alcohol Use: No  . Drug Use: No  . Sexually Active: Yes   Other Topics Concern  . Not on file   Social History Narrative  . No narrative on file    Past Surgical History  Procedure Date  . Colonoscopy   . Polypectomy     small adenomatous    Family History  Problem Relation Age of Onset  . Colon cancer    . Heart disease Mother   . Diabetes Father     Allergies  Allergen Reactions  . Amlodipine Besylate   . Aspirin   . Bisoprolol-Hydrochlorothiazide   . Candesartan Cilexetil   . Carbamazepine   . Cefadroxil   . Cefdinir   . Celecoxib   . Cholestyramine   . Clonidine   . Clonidine Hydrochloride   . Codeine   . Ezetimibe   . Hydrocodone   . Hydrocodone-Acetaminophen   . Meloxicam   . Methylprednisolone Sodium Succinate   . Morphine   . Nabumetone   . Naproxen   . Niacin   . Nisoldipine   .  Penicillins   . Pseudoephedrine-Guaifenesin   . Risedronate Sodium   . Rofecoxib   . Sulfonamide Derivatives   . Telithromycin   . Telmisartan   . Terfenadine   . Trandolapril-Verapamil Hcl Er   . Verapamil     Current Outpatient Prescriptions on File Prior to Visit  Medication Sig Dispense Refill  . ALPRAZolam (XANAX) 0.25 MG tablet Take 1 tablet (0.25 mg total) by mouth at bedtime as needed.  30 tablet  5  . Azelastine HCl (ASTEPRO) 0.15 % SOLN 1 spray by Nasal route 2 (two) times daily.        . beclomethasone (QVAR) 80 MCG/ACT inhaler Inhale 2 puffs into the lungs 2 (two) times daily.        . benzonatate (TESSALON) 100 MG capsule Take 100 mg by mouth 3 (three) times  daily as needed.        . calcium carbonate (OS-CAL) 600 MG TABS Take 600 mg by mouth 2 (two) times daily with a meal.        . cholecalciferol (VITAMIN D) 1000 UNITS tablet Take 1,000 Units by mouth 2 (two) times daily.        . Coenzyme Q10-Red Yeast Rice (CO Q-10 PLUS RED YEAST RICE) 60-600 MG CAPS Take 1 capsule by mouth daily.        . colestipol (COLESTID) 1 G tablet Take 2 tablets (2 g total) by mouth 2 (two) times daily.  360 tablet  3  . cyanocobalamin (,VITAMIN B-12,) 1000 MCG/ML injection Inject 1 mL (1,000 mcg total) into the muscle every 21 ( twenty-one) days.  30 mL  3  . ferrous sulfate 325 (65 FE) MG tablet Take by mouth.       . fexofenadine (ALLEGRA) 60 MG tablet Take 60 mg by mouth 2 (two) times daily.        . fish oil-omega-3 fatty acids 1000 MG capsule Take 1 g by mouth daily.      . furosemide (LASIX) 20 MG tablet Take 20 mg by mouth. 1 qd prn weakness      . hydrOXYzine (ATARAX) 25 MG tablet Take 25 mg by mouth 3 (three) times daily as needed.        . Misc Natural Products (RED WINE EXTRACT PLUS PO) Take 1 capsule by mouth daily.       . mupirocin (BACTROBAN) 2 % ointment Apply 1 application topically 2 (two) times daily.        . promethazine (PHENERGAN) 25 MG tablet Take 25 mg by mouth every 6 (six) hours as needed.        . ranitidine (ZANTAC) 150 MG capsule Take 150 mg by mouth 2 (two) times daily.        . sitaGLIPtan-metformin (JANUMET) 50-1000 MG per tablet Take 1 tablet by mouth 2 (two) times daily with a meal.  180 tablet  3  . trandolapril-verapamil (TARKA) 2-180 MG per tablet Take 1 tablet by mouth daily.  30 tablet  0  . DISCONTD: nateglinide (STARLIX) 120 MG tablet Take 120 mg by mouth 3 (three) times daily before meals.        . Diclofenac Sodium 3 % GEL Place 1 application onto the skin 2 (two) times daily.  300 g  3   Current Facility-Administered Medications on File Prior to Visit  Medication Dose Route Frequency Provider Last Rate Last Dose  .  cyanocobalamin ((VITAMIN B-12)) injection 1,000 mcg  1,000 mcg Intramuscular Q30 days Stacie Glaze, MD  1,000 mcg at 02/13/11 0936    BP 136/80  Pulse 76  Temp 98.6 F (37 C)  Resp 16  Ht 5' 7.5" (1.715 m)  Wt 148 lb (67.132 kg)  BMI 22.84 kg/m2       Objective:   Physical Exam  Nursing note and vitals reviewed. Constitutional: She is oriented to person, place, and time. She appears well-developed and well-nourished. No distress.  HENT:  Head: Normocephalic and atraumatic.  Right Ear: External ear normal.  Left Ear: External ear normal.  Nose: Nose normal.  Mouth/Throat: Oropharynx is clear and moist.  Eyes: Conjunctivae and EOM are normal. Pupils are equal, round, and reactive to light.  Neck: Normal range of motion. Neck supple. No JVD present. No tracheal deviation present. No thyromegaly present.  Cardiovascular: Normal rate, regular rhythm, normal heart sounds and intact distal pulses.   No murmur heard. Pulmonary/Chest: Effort normal and breath sounds normal. She has no wheezes. She exhibits no tenderness.  Abdominal: Soft. Bowel sounds are normal.  Musculoskeletal: Normal range of motion. She exhibits no edema and no tenderness.  Lymphadenopathy:    She has no cervical adenopathy.  Neurological: She is alert and oriented to person, place, and time. She has normal reflexes. No cranial nerve deficit.  Skin: Skin is warm and dry. She is not diaphoretic.  Psychiatric: She has a normal mood and affect. Her behavior is normal.          Assessment & Plan:  Patient is seen for followup of her high blood pressure 4 history of  diarrhea prominent air-filled bowel syndrome and monitoring of adult onset diabetes.  We will get hemoglobin A1c to look at her diabetes we'll get liver functions for side effect of medications CBC differential for history of anemia we'll get basic metabolic panel to look at her renal function with a history of hypertension.  Her blood pressure  stable her IBS is stable on her current regimen her diabetes appears well controlled.

## 2011-11-19 NOTE — Patient Instructions (Addendum)
The patient is instructed to continue all medications as prescribed. Schedule followup with check out clerk upon leaving the clinic  

## 2011-12-10 DIAGNOSIS — H2510 Age-related nuclear cataract, unspecified eye: Secondary | ICD-10-CM | POA: Diagnosis not present

## 2011-12-10 DIAGNOSIS — Z961 Presence of intraocular lens: Secondary | ICD-10-CM | POA: Diagnosis not present

## 2011-12-10 DIAGNOSIS — E119 Type 2 diabetes mellitus without complications: Secondary | ICD-10-CM | POA: Diagnosis not present

## 2012-01-31 NOTE — Progress Notes (Signed)
Subjective:    Patient ID: [name], female    DOB: 08-21-1929, 78 y.o.   MRN: [id number]  HPI In Viveiros is a 78 year old female followed for hypertension, gastroesophageal reflux and adult onset diabetes.  She has irritable bowel primarily a diarrhea type irritable bowel and a list of medications that she is intolerant that is significant in size and breath her primary reason for being in color with any of these medications is the diarrhea which Strider be a pre-existing not linked issue   Review of Systems  Constitutional: Negative for activity change, appetite change and fatigue.  HENT: Negative for ear pain, congestion, neck pain, postnasal drip and sinus pressure.   Eyes: Negative for redness and visual disturbance.  Respiratory: Negative for cough, shortness of breath and wheezing.   Cardiovascular: Positive for palpitations and leg swelling.  Gastrointestinal: Positive for diarrhea and abdominal distention. Negative for abdominal pain.  Genitourinary: Negative for dysuria, frequency and menstrual problem.  Musculoskeletal: Negative for myalgias, joint swelling and arthralgias.  Skin: Negative for rash and wound.  Neurological: Positive for tremors and weakness. Negative for dizziness and headaches.  Hematological: Negative for adenopathy. Does not bruise/bleed easily.  Psychiatric/Behavioral: Negative for disturbed wake/sleep cycle and decreased concentration.   Past Medical History  Diagnosis Date  . Seasonal allergies   . Diabetes mellitus   . HTN (hypertension)   . Osteoarthritis   . Palpitations   . Anemia   . History of colonic polyps   . GERD (gastroesophageal reflux disease)   . Hyperlipidemia   . Chronic facial pain   . Vitamin B12 deficiency   . Dyslipidemia   . Chronic foot pain   . Chronic cough   . Obesity     History   Social History  . Marital Status: Widowed    Spouse Name: N/A    Number of Children: N/A  . Years of Education: N/A   Occupational  History  . retired    Social History Main Topics  . Smoking status: Never Smoker   . Smokeless tobacco: Not on file  . Alcohol Use: No  . Drug Use: No  . Sexually Active: Yes   Other Topics Concern  . Not on file   Social History Narrative  . No narrative on file    Past Surgical History  Procedure Date  . Colonoscopy   . Polypectomy     small adenomatous    Family History  Problem Relation Age of Onset  . Colon cancer    . Heart disease Mother   . Diabetes Father     Allergies  Allergen Reactions  . Amlodipine Besylate   . Aspirin   . Bisoprolol-Hydrochlorothiazide   . Candesartan Cilexetil   . Carbamazepine   . Cefadroxil   . Cefdinir   . Celecoxib   . Cholestyramine   . Clonidine   . Clonidine Hydrochloride   . Codeine   . Ezetimibe   . Hydrocodone   . Hydrocodone-Acetaminophen   . Meloxicam   . Methylprednisolone Sodium Succinate   . Morphine   . Nabumetone   . Naproxen   . Niacin   . Nisoldipine   . Penicillins   . Pseudoephedrine-Guaifenesin   . Risedronate Sodium   . Rofecoxib   . Sulfonamide Derivatives   . Telithromycin   . Telmisartan   . Terfenadine   . Trandolapril-Verapamil Hcl Er   . Verapamil     Current Outpatient Prescriptions on File Prior to Visit  Medication Sig Dispense Refill  . ALPRAZolam (XANAX) 0.25 MG tablet Take 1 tablet (0.25 mg total) by mouth at bedtime as needed.  30 tablet  5  . Azelastine HCl (ASTEPRO) 0.15 % SOLN 1 spray by Nasal route 2 (two) times daily.        . beclomethasone (QVAR) 80 MCG/ACT inhaler Inhale 2 puffs into the lungs 2 (two) times daily.        . benzonatate (TESSALON) 100 MG capsule Take 100 mg by mouth 3 (three) times daily as needed.        . calcium carbonate (OS-CAL) 600 MG TABS Take 600 mg by mouth 2 (two) times daily with a meal.        . cholecalciferol (VITAMIN D) 1000 UNITS tablet Take 1,000 Units by mouth 2 (two) times daily.        . Coenzyme Q10-Red Yeast Rice (CO Q-10 PLUS RED  YEAST RICE) 60-600 MG CAPS Take 1 capsule by mouth daily.        . cyanocobalamin (,VITAMIN B-12,) 1000 MCG/ML injection Inject 1 mL (1,000 mcg total) into the muscle every 21 ( twenty-one) days.  30 mL  3  . ferrous sulfate 325 (65 FE) MG tablet Take by mouth.       . fexofenadine (ALLEGRA) 60 MG tablet Take 60 mg by mouth 2 (two) times daily.        . furosemide (LASIX) 20 MG tablet Take 20 mg by mouth. 1 qd prn weakness      . hydrOXYzine (ATARAX) 25 MG tablet Take 25 mg by mouth 3 (three) times daily as needed.        . Misc Natural Products (RED WINE EXTRACT PLUS PO) Take 1 capsule by mouth daily.       . mupirocin (BACTROBAN) 2 % ointment Apply 1 application topically 2 (two) times daily.        . promethazine (PHENERGAN) 25 MG tablet Take 25 mg by mouth every 6 (six) hours as needed.        . ranitidine (ZANTAC) 150 MG capsule Take 150 mg by mouth 2 (two) times daily.        . sitaGLIPtan-metformin (JANUMET) 50-1000 MG per tablet Take 1 tablet by mouth 2 (two) times daily with a meal.  180 tablet  3  . trandolapril-verapamil (TARKA) 2-180 MG per tablet Take 1 tablet by mouth daily.  30 tablet  0  . colestipol (COLESTID) 1 G tablet Take 2 tablets (2 g total) by mouth 2 (two) times daily.  360 tablet  3  . Diclofenac Sodium 3 % GEL Place 1 application onto the skin 2 (two) times daily.  300 g  3  . nateglinide (STARLIX) 120 MG tablet Take 1 tablet (120 mg total) by mouth 3 (three) times daily before meals.  270 tablet  3   Current Facility-Administered Medications on File Prior to Visit  Medication Dose Route Frequency Provider Last Rate Last Dose  . cyanocobalamin ((VITAMIN B-12)) injection 1,000 mcg  1,000 mcg Intramuscular Q30 days Stacie Glaze, MD   1,000 mcg at 02/13/11 0936    BP 146/80  Pulse 72  Temp 98.4 F (36.9 C)  Resp 16  Ht 5' 7.5" (1.715 m)  Wt 150 lb (68.04 kg)  BMI 23.15 kg/m2       Objective:   Physical Exam  Nursing note and vitals  reviewed. Constitutional: She appears well-developed and well-nourished.  HENT:  Head: Normocephalic and atraumatic.  Eyes:  Conjunctivae normal are normal. Pupils are equal, round, and reactive to light.  Cardiovascular: Normal rate and regular rhythm.   Murmur heard. Pulmonary/Chest: Effort normal and breath sounds normal.  Abdominal: Soft. Bowel sounds are normal.  Musculoskeletal: She exhibits edema. She exhibits no tenderness.          Assessment & Plan:  Relatively stable hypertension on current medicine.  Persistent diarrhea but controlled with the use of Colestid.  Discussion of discontinuing this will as it might be contributory to her diarrheal syndrome.  Continue the use of Janumet for her diabetes but monitor carefully her renal function given her age metformin Foister soon need to be discontinued and transition to a different

## 2012-03-13 ENCOUNTER — Other Ambulatory Visit: Payer: Self-pay | Admitting: Internal Medicine

## 2012-03-22 ENCOUNTER — Ambulatory Visit (INDEPENDENT_AMBULATORY_CARE_PROVIDER_SITE_OTHER): Payer: Medicare Other | Admitting: Internal Medicine

## 2012-03-22 ENCOUNTER — Encounter: Payer: Self-pay | Admitting: Internal Medicine

## 2012-03-22 VITALS — BP 140/80 | HR 72 | Temp 98.3°F | Resp 16 | Ht 67.5 in | Wt 147.0 lb

## 2012-03-22 DIAGNOSIS — T887XXA Unspecified adverse effect of drug or medicament, initial encounter: Secondary | ICD-10-CM

## 2012-03-22 DIAGNOSIS — IMO0001 Reserved for inherently not codable concepts without codable children: Secondary | ICD-10-CM | POA: Diagnosis not present

## 2012-03-22 DIAGNOSIS — E785 Hyperlipidemia, unspecified: Secondary | ICD-10-CM

## 2012-03-22 MED ORDER — BENZONATATE 100 MG PO CAPS: 100.0000 mg | ORAL_CAPSULE | Freq: Three times a day (TID) | ORAL | Status: DC | PRN

## 2012-03-22 NOTE — Patient Instructions (Signed)
Exercise to Stay Healthy Exercise helps you become and stay healthy. EXERCISE IDEAS AND TIPS Choose exercises that:  You enjoy.  Fit into your day. You do not need to exercise really hard to be healthy. You can do exercises at a slow or medium level and stay healthy. You can:  Stretch before and after working out.  Try yoga, Pilates, or tai chi.  Lift weights.  Walk fast, swim, jog, run, climb stairs, bicycle, dance, or rollerskate.  Take aerobic classes. Exercises that burn about 150 calories:  Running 1  miles in 15 minutes.  Playing volleyball for 45 to 60 minutes.  Washing and waxing a car for 45 to 60 minutes.  Playing touch football for 45 minutes.  Walking 1  miles in 35 minutes.  Pushing a stroller 1  miles in 30 minutes.  Playing basketball for 30 minutes.  Raking leaves for 30 minutes.  Bicycling 5 miles in 30 minutes.  Walking 2 miles in 30 minutes.  Dancing for 30 minutes.  Shoveling snow for 15 minutes.  Swimming laps for 20 minutes.  Walking up stairs for 15 minutes.  Bicycling 4 miles in 15 minutes.  Gardening for 30 to 45 minutes.  Jumping rope for 15 minutes.  Washing windows or floors for 45 to 60 minutes. Document Released: 05/23/2010 Document Revised: 07/13/2011 Document Reviewed: 05/23/2010 [employer] Patient Information 2013 [employer].  

## 2012-03-22 NOTE — Progress Notes (Signed)
  Subjective:    Patient ID: [name], female    DOB: 1929-09-26, 78 y.o.   MRN: [id number]  HPI  Discussed patient's lack of exercise  The patient lost her daughter has not been exercising she states that that has caused increased arthritic pain as well as some instability of gait we discussed the role exercise in longevity.  Patient's cholesterol is stable we reviewed the patient's blood glucose control which is moderate with an A1c ranging in the 7s with the A1c less than 7.  She now history of medication at this time because of her intolerance of the medications which she's agreed to increase an exercise.  Review of Systems  Constitutional: Positive for fatigue. Negative for activity change and appetite change.  HENT: Negative for ear pain, congestion, neck pain, postnasal drip and sinus pressure.   Eyes: Negative for redness and visual disturbance.  Respiratory: Negative for cough, shortness of breath and wheezing.   Gastrointestinal: Negative for abdominal pain and abdominal distention.  Genitourinary: Negative for dysuria, frequency and menstrual problem.  Musculoskeletal: Negative for myalgias, joint swelling and arthralgias.  Skin: Negative for rash and wound.  Neurological: Positive for weakness. Negative for dizziness and headaches.  Hematological: Negative for adenopathy. Does not bruise/bleed easily.  Psychiatric/Behavioral: Negative for sleep disturbance and decreased concentration.       Objective:   Physical Exam  Nursing note and vitals reviewed. Constitutional: She is oriented to person, place, and time. She appears well-developed and well-nourished. No distress.  HENT:  Head: Normocephalic and atraumatic.  Right Ear: External ear normal.  Left Ear: External ear normal.  Nose: Nose normal.  Mouth/Throat: Oropharynx is clear and moist.  Eyes: Conjunctivae normal and EOM are normal. Pupils are equal, round, and reactive to light.  Neck: Normal range of motion.  Neck supple. No JVD present. No tracheal deviation present. No thyromegaly present.  Cardiovascular: Normal rate and regular rhythm.   Murmur heard. Pulmonary/Chest: Effort normal and breath sounds normal. She has no wheezes. She exhibits no tenderness.  Abdominal: Soft. Bowel sounds are normal.  Musculoskeletal: Normal range of motion. She exhibits no edema and no tenderness.  Lymphadenopathy:    She has no cervical adenopathy.  Neurological: She is alert and oriented to person, place, and time. She has normal reflexes. No cranial nerve deficit.  Skin: Skin is warm and dry. She is not diaphoretic.  Psychiatric: She has a normal mood and affect. Her behavior is normal.          Assessment & Plan:  Diabetes moderately well-controlled stable A1c is in the low sevens with a goal of 7 or less discussed exercise as the primary intervention.  The patient states that at age 78 she is not likely to change her diet.  We discussed exercise as a key role both in longevity and in arthritic control and stability of her blood pressure.  Her blood pressure stable on current medications

## 2012-05-04 DIAGNOSIS — Z923 Personal history of irradiation: Secondary | ICD-10-CM

## 2012-05-04 DIAGNOSIS — C50919 Malignant neoplasm of unspecified site of unspecified female breast: Secondary | ICD-10-CM

## 2012-05-04 DIAGNOSIS — S92309A Fracture of unspecified metatarsal bone(s), unspecified foot, initial encounter for closed fracture: Secondary | ICD-10-CM

## 2012-05-04 HISTORY — PX: BREAST LUMPECTOMY: SHX2

## 2012-05-04 HISTORY — DX: Malignant neoplasm of unspecified site of unspecified female breast: C50.919

## 2012-05-04 HISTORY — DX: Personal history of irradiation: Z92.3

## 2012-05-04 HISTORY — DX: Fracture of unspecified metatarsal bone(s), unspecified foot, initial encounter for closed fracture: S92.309A

## 2012-05-07 ENCOUNTER — Other Ambulatory Visit: Payer: Self-pay | Admitting: Internal Medicine

## 2012-05-12 ENCOUNTER — Other Ambulatory Visit: Payer: Self-pay | Admitting: Internal Medicine

## 2012-05-20 DIAGNOSIS — H16209 Unspecified keratoconjunctivitis, unspecified eye: Secondary | ICD-10-CM | POA: Diagnosis not present

## 2012-05-23 DIAGNOSIS — H16209 Unspecified keratoconjunctivitis, unspecified eye: Secondary | ICD-10-CM | POA: Diagnosis not present

## 2012-05-27 DIAGNOSIS — H16209 Unspecified keratoconjunctivitis, unspecified eye: Secondary | ICD-10-CM | POA: Diagnosis not present

## 2012-05-27 DIAGNOSIS — H2000 Unspecified acute and subacute iridocyclitis: Secondary | ICD-10-CM | POA: Diagnosis not present

## 2012-06-10 ENCOUNTER — Other Ambulatory Visit (INDEPENDENT_AMBULATORY_CARE_PROVIDER_SITE_OTHER): Payer: Medicare Other

## 2012-06-10 DIAGNOSIS — E785 Hyperlipidemia, unspecified: Secondary | ICD-10-CM

## 2012-06-10 DIAGNOSIS — T887XXA Unspecified adverse effect of drug or medicament, initial encounter: Secondary | ICD-10-CM | POA: Diagnosis not present

## 2012-06-10 DIAGNOSIS — IMO0001 Reserved for inherently not codable concepts without codable children: Secondary | ICD-10-CM

## 2012-06-10 LAB — LIPID PANEL
Cholesterol: 130 mg/dL (ref 0–200)
HDL: 43.5 mg/dL (ref >39.00)
LDL Cholesterol: 60 mg/dL (ref 0–99)
Total CHOL/HDL Ratio: 3
Triglycerides: 131 mg/dL (ref 0.0–149.0)
VLDL: 26.2 mg/dL (ref 0.0–40.0)

## 2012-06-10 LAB — CBC WITH DIFFERENTIAL/PLATELET
Basophils Absolute: 0.1 10*3/uL (ref 0.0–0.1)
Basophils Relative: 0.8 % (ref 0.0–3.0)
Eosinophils Absolute: 0.1 10*3/uL (ref 0.0–0.7)
Eosinophils Relative: 0.9 % (ref 0.0–5.0)
HCT: 38.2 % (ref 36.0–46.0)
Hemoglobin: 12.6 g/dL (ref 12.0–15.0)
Lymphocytes Relative: 33.9 % (ref 12.0–46.0)
Lymphs Abs: 2.2 10*3/uL (ref 0.7–4.0)
MCHC: 33 g/dL (ref 30.0–36.0)
MCV: 89.4 fl (ref 78.0–100.0)
Monocytes Absolute: 0.3 10*3/uL (ref 0.1–1.0)
Monocytes Relative: 5.3 % (ref 3.0–12.0)
Neutro Abs: 3.8 10*3/uL (ref 1.4–7.7)
Neutrophils Relative %: 59.1 % (ref 43.0–77.0)
Platelets: 286 10*3/uL (ref 150.0–400.0)
RBC: 4.28 Mil/uL (ref 3.87–5.11)
RDW: 14.3 % (ref 11.5–14.6)
WBC: 6.5 10*3/uL (ref 4.5–10.5)

## 2012-06-10 LAB — HEMOGLOBIN A1C: Hgb A1c MFr Bld: 7.2 % — ABNORMAL HIGH (ref 4.6–6.5)

## 2012-06-10 LAB — HEPATIC FUNCTION PANEL
ALT: 21 U/L (ref 0–35)
AST: 17 U/L (ref 0–37)
Albumin: 3.7 g/dL (ref 3.5–5.2)
Alkaline Phosphatase: 44 U/L (ref 39–117)
Bilirubin, Direct: 0.1 mg/dL (ref 0.0–0.3)
Total Bilirubin: 0.7 mg/dL (ref 0.3–1.2)
Total Protein: 6.6 g/dL (ref 6.0–8.3)

## 2012-06-13 DIAGNOSIS — H16209 Unspecified keratoconjunctivitis, unspecified eye: Secondary | ICD-10-CM | POA: Diagnosis not present

## 2012-06-20 ENCOUNTER — Other Ambulatory Visit: Payer: Self-pay | Admitting: Internal Medicine

## 2012-06-22 ENCOUNTER — Ambulatory Visit: Payer: Medicare Other | Admitting: Internal Medicine

## 2012-06-27 ENCOUNTER — Encounter: Payer: Self-pay | Admitting: Internal Medicine

## 2012-06-27 ENCOUNTER — Ambulatory Visit (INDEPENDENT_AMBULATORY_CARE_PROVIDER_SITE_OTHER): Payer: Medicare Other | Admitting: Internal Medicine

## 2012-06-27 VITALS — BP 184/90 | HR 88 | Temp 98.2°F | Resp 16 | Ht 67.5 in | Wt 149.0 lb

## 2012-06-27 DIAGNOSIS — K589 Irritable bowel syndrome without diarrhea: Secondary | ICD-10-CM

## 2012-06-27 DIAGNOSIS — T887XXA Unspecified adverse effect of drug or medicament, initial encounter: Secondary | ICD-10-CM | POA: Diagnosis not present

## 2012-06-27 DIAGNOSIS — I10 Essential (primary) hypertension: Secondary | ICD-10-CM

## 2012-06-27 DIAGNOSIS — R197 Diarrhea, unspecified: Secondary | ICD-10-CM | POA: Diagnosis not present

## 2012-06-27 MED ORDER — VERAPAMIL HCL 40 MG PO TABS: 40.0000 mg | ORAL_TABLET | Freq: Three times a day (TID) | ORAL | Status: DC

## 2012-06-27 NOTE — Progress Notes (Signed)
Subjective:    Patient ID: [name], female    DOB: 09/01/1929, 79 y.o.   MRN: [id number]  HPI Was not able to tolerate the tarka. The prescription medication caused a side  effect she felt nauseated and had vomiting. She was on verapamil the past the only side effect from verapamil cause looser stools.       Review of Systems  Constitutional: Negative for activity change, appetite change and fatigue.  HENT: Negative for ear pain, congestion, neck pain, postnasal drip and sinus pressure.   Eyes: Negative for redness and visual disturbance.  Respiratory: Negative for cough, shortness of breath and wheezing.   Gastrointestinal: Negative for abdominal pain and abdominal distention.  Genitourinary: Negative for dysuria, frequency and menstrual problem.  Musculoskeletal: Negative for myalgias, joint swelling and arthralgias.  Skin: Negative for rash and wound.  Neurological: Negative for dizziness, weakness and headaches.  Hematological: Negative for adenopathy. Does not bruise/bleed easily.  Psychiatric/Behavioral: Negative for sleep disturbance and decreased concentration.   Past Medical History  Diagnosis Date  . Seasonal allergies   . Diabetes mellitus   . HTN (hypertension)   . Osteoarthritis   . Palpitations   . Anemia   . History of colonic polyps   . GERD (gastroesophageal reflux disease)   . Hyperlipidemia   . Chronic facial pain   . Vitamin B12 deficiency   . Dyslipidemia   . Chronic foot pain   . Chronic cough   . Obesity     History   Social History  . Marital Status: Widowed    Spouse Name: N/A    Number of Children: N/A  . Years of Education: N/A   Occupational History  . retired    Social History Main Topics  . Smoking status: Never Smoker   . Smokeless tobacco: Not on file  . Alcohol Use: No  . Drug Use: No  . Sexually Active: Yes   Other Topics Concern  . Not on file   Social History Narrative  . No narrative on file    Past Surgical  History  Procedure Laterality Date  . Colonoscopy    . Polypectomy      small adenomatous    Family History  Problem Relation Age of Onset  . Colon cancer    . Heart disease Mother   . Diabetes Father     Allergies  Allergen Reactions  . Amlodipine Besylate   . Aspirin   . Bisoprolol-Hydrochlorothiazide   . Candesartan Cilexetil   . Carbamazepine   . Cefadroxil   . Cefdinir   . Celecoxib   . Cholestyramine   . Clonidine   . Clonidine Hydrochloride   . Codeine   . Ezetimibe   . Hydrocodone   . Hydrocodone-Acetaminophen   . Meloxicam   . Methylprednisolone Sodium Succinate   . Morphine   . Nabumetone   . Naproxen   . Niacin   . Nisoldipine   . Penicillins   . Pseudoephedrine-Guaifenesin   . Risedronate Sodium   . Rofecoxib   . Sulfonamide Derivatives   . Telithromycin   . Telmisartan   . Terfenadine   . Trandolapril-Verapamil Hcl Er     Current Outpatient Prescriptions on File Prior to Visit  Medication Sig Dispense Refill  . ALPRAZolam (XANAX) 0.25 MG tablet Take 1 tablet (0.25 mg total) by mouth at bedtime as needed.  30 tablet  5  . Azelastine HCl (ASTEPRO) 0.15 % SOLN 1 spray by Nasal route 2 (  two) times daily.        . beclomethasone (QVAR) 80 MCG/ACT inhaler Inhale 2 puffs into the lungs 2 (two) times daily.        . benzonatate (TESSALON) 100 MG capsule Take 1 capsule (100 mg total) by mouth 3 (three) times daily as needed.  270 capsule  1  . calcium carbonate (OS-CAL) 600 MG TABS Take 600 mg by mouth 2 (two) times daily with a meal.        . cholecalciferol (VITAMIN D) 1000 UNITS tablet Take 1,000 Units by mouth 2 (two) times daily.        . Coenzyme Q10-Red Yeast Rice (CO Q-10 PLUS RED YEAST RICE) 60-600 MG CAPS Take 1 capsule by mouth daily.        . COLESTID 1 G tablet TAKE 2 TABLETS BY MOUTH 2 TIMES A DAY  360 tablet  0  . cyanocobalamin (,VITAMIN B-12,) 1000 MCG/ML injection INJECT 1 ML INTO THE MUSCLE EVERY 21 DAYS  10 mL  2  . Diclofenac Sodium  3 % GEL Place 1 application onto the skin 2 (two) times daily.  300 g  3  . ferrous sulfate 325 (65 FE) MG tablet Take by mouth.       . fexofenadine (ALLEGRA) 60 MG tablet Take 60 mg by mouth 2 (two) times daily.        . fish oil-omega-3 fatty acids 1000 MG capsule Take 1 g by mouth daily.      . furosemide (LASIX) 20 MG tablet Take 20 mg by mouth. 1 qd prn weakness      . hydrOXYzine (ATARAX) 25 MG tablet Take 25 mg by mouth 3 (three) times daily as needed.        Marland Kitchen JANUMET 50-1000 MG per tablet TAKE 1 TABLET BY MOUTH TWO TIMES DAILY WITH A MEAL  180 tablet  0  . Misc Natural Products (RED WINE EXTRACT PLUS PO) Take 1 capsule by mouth daily.       . mupirocin (BACTROBAN) 2 % ointment Apply 1 application topically 2 (two) times daily.        . nateglinide (STARLIX) 120 MG tablet Take 1 tablet (120 mg total) by mouth 3 (three) times daily before meals.  270 tablet  3  . nateglinide (STARLIX) 120 MG tablet Take 1 tablet (120 mg total) by mouth 3 (three) times daily before meals.  270 tablet  3  . promethazine (PHENERGAN) 25 MG tablet Take 25 mg by mouth every 6 (six) hours as needed.        . ranitidine (ZANTAC) 150 MG capsule Take 150 mg by mouth 2 (two) times daily.         Current Facility-Administered Medications on File Prior to Visit  Medication Dose Route Frequency Provider Last Rate Last Dose  . cyanocobalamin ((VITAMIN B-12)) injection 1,000 mcg  1,000 mcg Intramuscular Q30 days Stacie Glaze, MD   1,000 mcg at 02/13/11 0936    BP 184/90  Pulse 88  Temp(Src) 98.2 F (36.8 C)  Resp 16  Ht 5' 7.5" (1.715 m)  Wt 149 lb (67.586 kg)  BMI 22.98 kg/m2       Objective:   Physical Exam  Nursing note and vitals reviewed. Constitutional: She is oriented to person, place, and time. She appears well-developed and well-nourished. No distress.  HENT:  Head: Normocephalic and atraumatic.  Right Ear: External ear normal.  Left Ear: External ear normal.  Nose: Nose normal.  Mouth/Throat: Oropharynx is clear and moist.  Eyes: Conjunctivae and EOM are normal. Pupils are equal, round, and reactive to light.  Neck: Normal range of motion. Neck supple. No JVD present. No tracheal deviation present. No thyromegaly present.  Cardiovascular: Normal rate and regular rhythm.   Murmur heard. Pulmonary/Chest: Effort normal and breath sounds normal. She has no wheezes. She exhibits no tenderness.  Abdominal: Soft. There is tenderness.  Musculoskeletal: She exhibits edema. She exhibits no tenderness.  Lymphadenopathy:    She has no cervical adenopathy.  Neurological: She is alert and oriented to person, place, and time. She has normal reflexes. No cranial nerve deficit.  Skin: She is not diaphoretic.          Assessment & Plan:  Resume low-dose verapamil 3 times a day Syndrome is controlled with current medication for her diarrhea Blood sugars are reported to be stable but an A1c  Was done at endocrine and was 7.2 which is stable for her, as well as a basic metabolic  Showing stable renal function Monitor blood pressure.

## 2012-07-29 ENCOUNTER — Other Ambulatory Visit: Payer: Self-pay | Admitting: *Deleted

## 2012-07-29 DIAGNOSIS — I10 Essential (primary) hypertension: Secondary | ICD-10-CM

## 2012-07-29 MED ORDER — VERAPAMIL HCL 40 MG PO TABS: 40.0000 mg | ORAL_TABLET | Freq: Three times a day (TID) | ORAL | Status: DC

## 2012-10-03 ENCOUNTER — Ambulatory Visit (INDEPENDENT_AMBULATORY_CARE_PROVIDER_SITE_OTHER): Payer: Medicare Other | Admitting: Internal Medicine

## 2012-10-03 ENCOUNTER — Encounter: Payer: Self-pay | Admitting: Internal Medicine

## 2012-10-03 VITALS — BP 136/80 | HR 76 | Temp 98.3°F | Resp 16 | Ht 67.5 in | Wt 146.0 lb

## 2012-10-03 DIAGNOSIS — E119 Type 2 diabetes mellitus without complications: Secondary | ICD-10-CM

## 2012-10-03 DIAGNOSIS — R5383 Other fatigue: Secondary | ICD-10-CM | POA: Diagnosis not present

## 2012-10-03 DIAGNOSIS — D649 Anemia, unspecified: Secondary | ICD-10-CM | POA: Diagnosis not present

## 2012-10-03 DIAGNOSIS — R5381 Other malaise: Secondary | ICD-10-CM

## 2012-10-03 DIAGNOSIS — I10 Essential (primary) hypertension: Secondary | ICD-10-CM | POA: Diagnosis not present

## 2012-10-03 LAB — BASIC METABOLIC PANEL
BUN: 10 mg/dL (ref 6–23)
CO2: 22 mEq/L (ref 19–32)
Calcium: 9.3 mg/dL (ref 8.4–10.5)
Chloride: 100 mEq/L (ref 96–112)
Creatinine, Ser: 0.8 mg/dL (ref 0.4–1.2)
GFR: 74.32 mL/min (ref >60.00)
Glucose, Bld: 161 mg/dL — ABNORMAL HIGH (ref 70–99)
Potassium: 3.7 mEq/L (ref 3.5–5.1)
Sodium: 134 mEq/L — ABNORMAL LOW (ref 135–145)

## 2012-10-03 LAB — CBC WITH DIFFERENTIAL/PLATELET
Basophils Absolute: 0 10*3/uL (ref 0.0–0.1)
Basophils Relative: 0.5 % (ref 0.0–3.0)
Eosinophils Absolute: 0 10*3/uL (ref 0.0–0.7)
Eosinophils Relative: 0.5 % (ref 0.0–5.0)
HCT: 37 % (ref 36.0–46.0)
Hemoglobin: 12.6 g/dL (ref 12.0–15.0)
Lymphocytes Relative: 24.6 % (ref 12.0–46.0)
Lymphs Abs: 2 10*3/uL (ref 0.7–4.0)
MCHC: 34.2 g/dL (ref 30.0–36.0)
MCV: 88.4 fl (ref 78.0–100.0)
Monocytes Absolute: 0.5 10*3/uL (ref 0.1–1.0)
Monocytes Relative: 5.8 % (ref 3.0–12.0)
Neutro Abs: 5.6 10*3/uL (ref 1.4–7.7)
Neutrophils Relative %: 68.6 % (ref 43.0–77.0)
Platelets: 313 10*3/uL (ref 150.0–400.0)
RBC: 4.19 Mil/uL (ref 3.87–5.11)
RDW: 14.4 % (ref 11.5–14.6)
WBC: 8.1 10*3/uL (ref 4.5–10.5)

## 2012-10-03 LAB — HEMOGLOBIN A1C: Hgb A1c MFr Bld: 7.4 % — ABNORMAL HIGH (ref 4.6–6.5)

## 2012-10-03 LAB — TSH: TSH: 2.18 u[IU]/mL (ref 0.35–5.50)

## 2012-10-03 NOTE — Patient Instructions (Signed)
The patient is instructed to continue all medications as prescribed. Schedule followup with check out clerk upon leaving the clinic  

## 2012-10-03 NOTE — Progress Notes (Signed)
Subjective:    Patient ID: [name], female    DOB: 01/20/1930, 79 y.o.   MRN: [id number]  HPI Complaint of excessive somnolence Has noted some decreased concentration and episodes of confusion Has not gotten lost nor forget to pay bill.    Review of Systems  Constitutional: Negative for activity change, appetite change and fatigue.  HENT: Negative for ear pain, congestion, neck pain, postnasal drip and sinus pressure.   Eyes: Negative for redness and visual disturbance.  Respiratory: Negative for cough, shortness of breath and wheezing.   Gastrointestinal: Negative for abdominal pain and abdominal distention.  Genitourinary: Negative for dysuria, frequency and menstrual problem.  Musculoskeletal: Negative for myalgias, joint swelling and arthralgias.  Skin: Negative for rash and wound.  Neurological: Negative for dizziness, weakness and headaches.  Hematological: Negative for adenopathy. Does not bruise/bleed easily.  Psychiatric/Behavioral: Negative for sleep disturbance and decreased concentration.   Past Medical History  Diagnosis Date  . Seasonal allergies   . Diabetes mellitus   . HTN (hypertension)   . Osteoarthritis   . Palpitations   . Anemia   . History of colonic polyps   . GERD (gastroesophageal reflux disease)   . Hyperlipidemia   . Chronic facial pain   . Vitamin B12 deficiency   . Dyslipidemia   . Chronic foot pain   . Chronic cough   . Obesity     History   Social History  . Marital Status: Widowed    Spouse Name: N/A    Number of Children: N/A  . Years of Education: N/A   Occupational History  . retired    Social History Main Topics  . Smoking status: Never Smoker   . Smokeless tobacco: Not on file  . Alcohol Use: No  . Drug Use: No  . Sexually Active: Yes   Other Topics Concern  . Not on file   Social History Narrative  . No narrative on file    Past Surgical History  Procedure Laterality Date  . Colonoscopy    . Polypectomy       small adenomatous    Family History  Problem Relation Age of Onset  . Colon cancer    . Heart disease Mother   . Diabetes Father     Allergies  Allergen Reactions  . Amlodipine Besylate   . Aspirin   . Bisoprolol-Hydrochlorothiazide   . Candesartan Cilexetil   . Carbamazepine   . Cefadroxil   . Cefdinir   . Celecoxib   . Cholestyramine   . Clonidine   . Clonidine Hydrochloride   . Codeine   . Ezetimibe   . Hydrocodone   . Hydrocodone-Acetaminophen   . Meloxicam   . Methylprednisolone Sodium Succinate   . Morphine   . Nabumetone   . Naproxen   . Niacin   . Nisoldipine   . Penicillins   . Pseudoephedrine-Guaifenesin   . Risedronate Sodium   . Rofecoxib   . Sulfonamide Derivatives   . Telithromycin   . Telmisartan   . Terfenadine   . Trandolapril-Verapamil Hcl Er     Current Outpatient Prescriptions on File Prior to Visit  Medication Sig Dispense Refill  . ALPRAZolam (XANAX) 0.25 MG tablet Take 1 tablet (0.25 mg total) by mouth at bedtime as needed.  30 tablet  5  . Azelastine HCl (ASTEPRO) 0.15 % SOLN 1 spray by Nasal route 2 (two) times daily.        . beclomethasone (QVAR) 80 MCG/ACT inhaler Inhale  2 puffs into the lungs 2 (two) times daily.        . benzonatate (TESSALON) 100 MG capsule Take 1 capsule (100 mg total) by mouth 3 (three) times daily as needed.  270 capsule  1  . calcium carbonate (OS-CAL) 600 MG TABS Take 600 mg by mouth 2 (two) times daily with a meal.        . cholecalciferol (VITAMIN D) 1000 UNITS tablet Take 1,000 Units by mouth 2 (two) times daily.        . COLESTID 1 G tablet TAKE 2 TABLETS BY MOUTH 2 TIMES A DAY  360 tablet  0  . cyanocobalamin (,VITAMIN B-12,) 1000 MCG/ML injection INJECT 1 ML INTO THE MUSCLE EVERY 21 DAYS  10 mL  2  . ferrous sulfate 325 (65 FE) MG tablet Take by mouth.       . fexofenadine (ALLEGRA) 60 MG tablet Take 60 mg by mouth 2 (two) times daily.        . fish oil-omega-3 fatty acids 1000 MG capsule Take 1 g  by mouth daily.      . furosemide (LASIX) 20 MG tablet Take 20 mg by mouth. 1 qd prn weakness      . JANUMET 50-1000 MG per tablet TAKE 1 TABLET BY MOUTH TWO TIMES DAILY WITH A MEAL  180 tablet  0  . nateglinide (STARLIX) 120 MG tablet Take 1 tablet (120 mg total) by mouth 3 (three) times daily before meals.  270 tablet  3  . promethazine (PHENERGAN) 25 MG tablet Take 25 mg by mouth every 6 (six) hours as needed.        . ranitidine (ZANTAC) 150 MG capsule Take 150 mg by mouth 2 (two) times daily.        . verapamil (CALAN) 40 MG tablet Take 1 tablet (40 mg total) by mouth 3 (three) times daily.  270 tablet  3  . Diclofenac Sodium 3 % GEL Place 1 application onto the skin 2 (two) times daily.  300 g  3  . hydrOXYzine (ATARAX) 25 MG tablet Take 25 mg by mouth 3 (three) times daily as needed.         Current Facility-Administered Medications on File Prior to Visit  Medication Dose Route Frequency Provider Last Rate Last Dose  . cyanocobalamin ((VITAMIN B-12)) injection 1,000 mcg  1,000 mcg Intramuscular Q30 days Stacie Glaze, MD   1,000 mcg at 02/13/11 0936    BP 136/80  Pulse 76  Temp(Src) 98.3 F (36.8 C)  Resp 16  Ht 5' 7.5" (1.715 m)  Wt 146 lb (66.225 kg)  BMI 22.52 kg/m2       Objective:   Physical Exam  Nursing note and vitals reviewed. Constitutional: She is oriented to person, place, and time. She appears well-developed and well-nourished. No distress.  HENT:  Head: Normocephalic and atraumatic.  Right Ear: External ear normal.  Left Ear: External ear normal.  Nose: Nose normal.  Mouth/Throat: Oropharynx is clear and moist.  Eyes: Conjunctivae and EOM are normal. Pupils are equal, round, and reactive to light.  Neck: Normal range of motion. Neck supple. No JVD present. No tracheal deviation present. No thyromegaly present.  Cardiovascular: Normal rate, regular rhythm, normal heart sounds and intact distal pulses.   No murmur heard. Pulmonary/Chest: Effort normal and  breath sounds normal. She has no wheezes. She exhibits no tenderness.  Abdominal: Soft. Bowel sounds are normal.  Musculoskeletal: Normal range of motion. She exhibits no  edema and no tenderness.  Lymphadenopathy:    She has no cervical adenopathy.  Neurological: She is alert and oriented to person, place, and time. She has normal reflexes. No cranial nerve deficit.  Skin: Skin is warm and dry. She is not diaphoretic.  Psychiatric: She has a normal mood and affect. Her behavior is normal.          Assessment & Plan:  Stable HTN Moderate increased fatigue Check CBC and TSH Memory loss? Passed clock face.

## 2012-10-10 ENCOUNTER — Other Ambulatory Visit: Payer: Self-pay

## 2012-10-10 DIAGNOSIS — Z1231 Encounter for screening mammogram for malignant neoplasm of breast: Secondary | ICD-10-CM

## 2012-10-11 ENCOUNTER — Other Ambulatory Visit: Payer: Self-pay | Admitting: Internal Medicine

## 2012-10-23 ENCOUNTER — Other Ambulatory Visit: Payer: Self-pay | Admitting: Internal Medicine

## 2012-11-11 ENCOUNTER — Telehealth: Payer: Self-pay | Admitting: Internal Medicine

## 2012-11-11 MED ORDER — SITAGLIPTIN PHOS-METFORMIN HCL 50-1000 MG PO TABS: ORAL_TABLET | ORAL | Status: DC

## 2012-11-11 NOTE — Telephone Encounter (Signed)
sent 

## 2012-11-11 NOTE — Telephone Encounter (Signed)
Pt requesting we call rx JANUMET 50-1000 MG per tablet to [facility]. She usually gets #180 for a 90-day supply. She is almost out and thought she had refills.

## 2012-11-16 ENCOUNTER — Ambulatory Visit
Admission: RE | Admit: 2012-11-16 | Discharge: 2012-11-16 | Disposition: A | Discharge status: Home / Self Care | Payer: Medicare Other | Source: Ambulatory Visit

## 2012-11-16 DIAGNOSIS — Z1231 Encounter for screening mammogram for malignant neoplasm of breast: Secondary | ICD-10-CM | POA: Diagnosis not present

## 2012-11-18 ENCOUNTER — Other Ambulatory Visit: Payer: Self-pay | Admitting: Internal Medicine

## 2012-11-18 DIAGNOSIS — R928 Other abnormal and inconclusive findings on diagnostic imaging of breast: Secondary | ICD-10-CM

## 2012-12-12 ENCOUNTER — Ambulatory Visit
Admission: RE | Admit: 2012-12-12 | Discharge: 2012-12-12 | Disposition: A | Discharge status: Home / Self Care | Payer: Medicare Other | Source: Ambulatory Visit | Attending: Internal Medicine | Admitting: Internal Medicine

## 2012-12-12 DIAGNOSIS — R928 Other abnormal and inconclusive findings on diagnostic imaging of breast: Secondary | ICD-10-CM | POA: Diagnosis not present

## 2012-12-14 DIAGNOSIS — H2510 Age-related nuclear cataract, unspecified eye: Secondary | ICD-10-CM | POA: Diagnosis not present

## 2012-12-14 DIAGNOSIS — H25049 Posterior subcapsular polar age-related cataract, unspecified eye: Secondary | ICD-10-CM | POA: Diagnosis not present

## 2012-12-14 DIAGNOSIS — E119 Type 2 diabetes mellitus without complications: Secondary | ICD-10-CM | POA: Diagnosis not present

## 2012-12-14 DIAGNOSIS — Z961 Presence of intraocular lens: Secondary | ICD-10-CM | POA: Diagnosis not present

## 2012-12-26 ENCOUNTER — Other Ambulatory Visit: Payer: Self-pay | Admitting: Internal Medicine

## 2012-12-26 ENCOUNTER — Ambulatory Visit
Admission: RE | Admit: 2012-12-26 | Discharge: 2012-12-26 | Disposition: A | Discharge status: Home / Self Care | Payer: Medicare Other | Source: Ambulatory Visit | Attending: Internal Medicine | Admitting: Internal Medicine

## 2012-12-26 ENCOUNTER — Ambulatory Visit: Admission: RE | Admit: 2012-12-26 | Payer: Medicare Other | Source: Ambulatory Visit

## 2012-12-26 DIAGNOSIS — R928 Other abnormal and inconclusive findings on diagnostic imaging of breast: Secondary | ICD-10-CM

## 2012-12-26 DIAGNOSIS — C50911 Malignant neoplasm of unspecified site of right female breast: Secondary | ICD-10-CM

## 2012-12-26 DIAGNOSIS — D0590 Unspecified type of carcinoma in situ of unspecified breast: Secondary | ICD-10-CM | POA: Diagnosis not present

## 2012-12-26 DIAGNOSIS — R921 Mammographic calcification found on diagnostic imaging of breast: Secondary | ICD-10-CM

## 2012-12-26 HISTORY — DX: Malignant neoplasm of unspecified site of right female breast: C50.911

## 2012-12-27 ENCOUNTER — Ambulatory Visit
Admission: RE | Admit: 2012-12-27 | Discharge: 2012-12-27 | Disposition: A | Discharge status: Home / Self Care | Payer: Medicare Other | Source: Ambulatory Visit | Attending: Internal Medicine | Admitting: Internal Medicine

## 2012-12-27 ENCOUNTER — Other Ambulatory Visit: Payer: Self-pay | Admitting: Internal Medicine

## 2012-12-27 DIAGNOSIS — R921 Mammographic calcification found on diagnostic imaging of breast: Secondary | ICD-10-CM

## 2012-12-27 DIAGNOSIS — D0511 Intraductal carcinoma in situ of right breast: Secondary | ICD-10-CM

## 2012-12-27 DIAGNOSIS — N6489 Other specified disorders of breast: Secondary | ICD-10-CM | POA: Diagnosis not present

## 2012-12-30 ENCOUNTER — Telehealth: Payer: Self-pay | Admitting: *Deleted

## 2012-12-30 DIAGNOSIS — C50111 Malignant neoplasm of central portion of right female breast: Secondary | ICD-10-CM

## 2012-12-30 DIAGNOSIS — C50119 Malignant neoplasm of central portion of unspecified female breast: Secondary | ICD-10-CM | POA: Insufficient documentation

## 2012-12-30 NOTE — Telephone Encounter (Signed)
Called and spoke with patient and confirmed [facility] appt for 01/04/13 at 12N.  Instructions and contact information.

## 2013-01-03 ENCOUNTER — Ambulatory Visit
Admission: RE | Admit: 2013-01-03 | Discharge: 2013-01-03 | Disposition: A | Discharge status: Home / Self Care | Payer: Medicare Other | Source: Ambulatory Visit | Attending: Internal Medicine | Admitting: Internal Medicine

## 2013-01-03 DIAGNOSIS — D0511 Intraductal carcinoma in situ of right breast: Secondary | ICD-10-CM

## 2013-01-03 DIAGNOSIS — D0590 Unspecified type of carcinoma in situ of unspecified breast: Secondary | ICD-10-CM | POA: Diagnosis not present

## 2013-01-03 MED ORDER — GADOBENATE DIMEGLUMINE 529 MG/ML IV SOLN
13.0000 mL | Freq: Once | INTRAVENOUS | Status: AC | PRN
  Administered 2013-01-03: 13 mL via INTRAVENOUS

## 2013-01-04 ENCOUNTER — Other Ambulatory Visit (HOSPITAL_BASED_OUTPATIENT_CLINIC_OR_DEPARTMENT_OTHER): Payer: Medicare Other | Admitting: Lab

## 2013-01-04 ENCOUNTER — Encounter: Payer: Self-pay | Admitting: Oncology

## 2013-01-04 ENCOUNTER — Ambulatory Visit (HOSPITAL_BASED_OUTPATIENT_CLINIC_OR_DEPARTMENT_OTHER): Payer: Medicare Other | Admitting: General Surgery

## 2013-01-04 ENCOUNTER — Encounter: Payer: Self-pay | Admitting: *Deleted

## 2013-01-04 ENCOUNTER — Ambulatory Visit
Admission: RE | Admit: 2013-01-04 | Discharge: 2013-01-04 | Disposition: A | Discharge status: Home / Self Care | Payer: Medicare Other | Source: Ambulatory Visit | Attending: Radiation Oncology | Admitting: Radiation Oncology

## 2013-01-04 ENCOUNTER — Encounter: Payer: Self-pay | Admitting: Specialist

## 2013-01-04 ENCOUNTER — Ambulatory Visit (HOSPITAL_BASED_OUTPATIENT_CLINIC_OR_DEPARTMENT_OTHER): Payer: Medicare Other | Admitting: Oncology

## 2013-01-04 ENCOUNTER — Ambulatory Visit (HOSPITAL_COMMUNITY)
Admission: RE | Admit: 2013-01-04 | Discharge: 2013-01-04 | Disposition: A | Discharge status: Home / Self Care | Payer: Medicare Other | Source: Ambulatory Visit | Attending: Oncology | Admitting: Oncology

## 2013-01-04 ENCOUNTER — Other Ambulatory Visit (INDEPENDENT_AMBULATORY_CARE_PROVIDER_SITE_OTHER): Payer: Self-pay | Admitting: General Surgery

## 2013-01-04 ENCOUNTER — Ambulatory Visit: Payer: Medicare Other

## 2013-01-04 ENCOUNTER — Ambulatory Visit: Payer: Medicare Other | Admitting: Physical Therapy

## 2013-01-04 VITALS — BP 160/89 | HR 80 | Temp 97.4°F | Resp 20 | Ht 67.5 in | Wt 141.0 lb

## 2013-01-04 DIAGNOSIS — D0590 Unspecified type of carcinoma in situ of unspecified breast: Secondary | ICD-10-CM

## 2013-01-04 DIAGNOSIS — C50119 Malignant neoplasm of central portion of unspecified female breast: Secondary | ICD-10-CM

## 2013-01-04 DIAGNOSIS — Z171 Estrogen receptor negative status [ER-]: Secondary | ICD-10-CM

## 2013-01-04 DIAGNOSIS — S92309A Fracture of unspecified metatarsal bone(s), unspecified foot, initial encounter for closed fracture: Secondary | ICD-10-CM | POA: Diagnosis not present

## 2013-01-04 DIAGNOSIS — W19XXXA Unspecified fall, initial encounter: Secondary | ICD-10-CM | POA: Insufficient documentation

## 2013-01-04 DIAGNOSIS — C50111 Malignant neoplasm of central portion of right female breast: Secondary | ICD-10-CM

## 2013-01-04 DIAGNOSIS — M7989 Other specified soft tissue disorders: Secondary | ICD-10-CM | POA: Diagnosis not present

## 2013-01-04 DIAGNOSIS — C50919 Malignant neoplasm of unspecified site of unspecified female breast: Secondary | ICD-10-CM | POA: Diagnosis not present

## 2013-01-04 LAB — COMPREHENSIVE METABOLIC PANEL (CC13)
ALT: 16 U/L (ref 0–55)
AST: 14 U/L (ref 5–34)
Albumin: 3.6 g/dL (ref 3.5–5.0)
Alkaline Phosphatase: 52 U/L (ref 40–150)
BUN: 10.3 mg/dL (ref 7.0–26.0)
CO2: 22 mEq/L (ref 22–29)
Calcium: 9.6 mg/dL (ref 8.4–10.4)
Chloride: 102 mEq/L (ref 98–109)
Creatinine: 1 mg/dL (ref 0.6–1.1)
Glucose: 285 mg/dl — ABNORMAL HIGH (ref 70–140)
Potassium: 3.9 mEq/L (ref 3.5–5.1)
Sodium: 138 mEq/L (ref 136–145)
Total Bilirubin: 1.01 mg/dL (ref 0.20–1.20)
Total Protein: 6.4 g/dL (ref 6.4–8.3)

## 2013-01-04 LAB — CBC WITH DIFFERENTIAL/PLATELET
BASO%: 0.5 % (ref 0.0–2.0)
Basophils Absolute: 0 10*3/uL (ref 0.0–0.1)
EOS%: 0.4 % (ref 0.0–7.0)
Eosinophils Absolute: 0 10*3/uL (ref 0.0–0.5)
HCT: 37.7 % (ref 34.8–46.6)
HGB: 12.7 g/dL (ref 11.6–15.9)
LYMPH%: 19.1 % (ref 14.0–49.7)
MCH: 30 pg (ref 25.1–34.0)
MCHC: 33.7 g/dL (ref 31.5–36.0)
MCV: 89.2 fL (ref 79.5–101.0)
MONO#: 0.7 10*3/uL (ref 0.1–0.9)
MONO%: 7.7 % (ref 0.0–14.0)
NEUT#: 6.4 10*3/uL (ref 1.5–6.5)
NEUT%: 72.3 % (ref 38.4–76.8)
Platelets: 300 10*3/uL (ref 145–400)
RBC: 4.22 10*6/uL (ref 3.70–5.45)
RDW: 13.9 % (ref 11.2–14.5)
WBC: 8.8 10*3/uL (ref 3.9–10.3)
lymph#: 1.7 10*3/uL (ref 0.9–3.3)

## 2013-01-04 NOTE — Progress Notes (Signed)
I met with the patient in breast clinic.  She was accompanied by her sister and niece.  She rated her distress as "0" and genuinely appeared not worried.  She was more concerned about having fallen yesterday in her kitchen.  I gave her information on support services, although she said she has much support from family and friends.  A pleasant lady with a wonderful sense of humor.

## 2013-01-04 NOTE — Progress Notes (Signed)
Chief Complaint: New diagnosis of breast cancer  History:    [name] is a 79 y.o. postmenopausal female referred by Dr. Li  for evaluation of recently diagnosed carcinoma of the right breast. She recently presented for a screening mamogram revealing calcifications at the 12 o'clock position.  Subsequent imaging included diagnostic mamogram showing Suspicious pleomorphic calcifications at the 12:00 position in the right breast measuring 1.1 cm.   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 She is seen now in [facility] for initial treatment planning.  She has experienced no breast lump,pain or discharge.  She does not have a personal history of any previous breast problems.  Findings at that time were the following:  Tumor size: 1.7 cm  Tumor grade: 3  Estrogen Receptor: negative Progesterone Receptor: negative  Her-2 neu: not obtained  Lymph node status: negative Neurovascular invasion: no Lymphatic invasion: no  Past Medical History  Diagnosis Date  . Seasonal allergies   . Diabetes mellitus   . HTN (hypertension)   . Osteoarthritis   . Palpitations   . Anemia   . History of colonic polyps   . GERD (gastroesophageal reflux disease)   . Hyperlipidemia   . Chronic facial pain   . Vitamin B12 deficiency   . Dyslipidemia   . Chronic foot pain   . Chronic cough   . Obesity     Past Surgical History  Procedure Laterality Date  . Colonoscopy    . Polypectomy      small adenomatous    Current Outpatient Prescriptions  Medication Sig Dispense Refill  . ALPRAZolam (XANAX) 0.25 MG tablet Take 1 tablet (0.25 mg total) by mouth at bedtime as needed.  30 tablet  5  . Azelastine HCl (ASTEPRO) 0.15 % SOLN 1 spray by Nasal route 2 (two) times daily.        . beclomethasone (QVAR) 80 MCG/ACT inhaler Inhale 2 puffs into the lungs 2 (two) times daily.        . benzonatate (TESSALON) 100 MG capsule Take 1 capsule (100  mg total) by mouth 3 (three) times daily as needed.  270 capsule  1  . calcium carbonate (OS-CAL) 600 MG TABS Take 600 mg by mouth 2 (two) times daily with a meal.        . cholecalciferol (VITAMIN D) 1000 UNITS tablet Take 1,000 Units by mouth daily at 6 (six) AM.       . cyanocobalamin (,VITAMIN B-12,) 1000 MCG/ML injection INJECT 1 ML INTO THE MUSCLE EVERY 21 DAYS  10 mL  2  . Diclofenac Sodium 3 % GEL Place 1 application onto the skin 2 (two) times daily.  300 g  3  . ferrous sulfate 325 (65 FE) MG tablet Take by mouth.       . fexofenadine (ALLEGRA) 60 MG tablet Take 60 mg by mouth 2 (two) times daily.        . fish oil-omega-3 fatty acids 1000 MG capsule Take 1 g by mouth 2 (two) times daily.       . furosemide (LASIX) 20 MG tablet Take 20 mg by mouth. 1 qd prn weakness      . hydrOXYzine (ATARAX) 25 MG tablet Take 25 mg by mouth 3 (three) times daily as needed.        . MICRONIZED COLESTIPOL HCL 1 G tablet TAKE 2 TABLETS TWICE A DAY  360 tablet  3  . promethazine (  PHENERGAN) 25 MG tablet Take 25 mg by mouth every 6 (six) hours as needed.        . ranitidine (ZANTAC) 150 MG capsule Take 150 mg by mouth 2 (two) times daily.        . sitaGLIPtan-metformin (JANUMET) 50-1000 MG per tablet TAKE 1 TABLET BY MOUTH TWO TIMES DAILY WITH A MEAL  180 tablet  3  . STARLIX 120 MG tablet TAKE 1 TABLET THREE TIMES A DAY BEFORE MEALS  270 tablet  2  . trandolapril-verapamil (TARKA) 2-180 MG per tablet Take 1 tablet by mouth daily.      . verapamil (CALAN) 40 MG tablet Take 1 tablet (40 mg total) by mouth 3 (three) times daily.  270 tablet  3   Current Facility-Administered Medications  Medication Dose Route Frequency Provider Last Rate Last Dose  . cyanocobalamin ((VITAMIN B-12)) injection 1,000 mcg  1,000 mcg Intramuscular Q30 days John E Jenkins, MD   1,000 mcg at 02/13/11 0936    Family History  Problem Relation Age of Onset  . Colon cancer    . Heart disease Mother   . Diabetes Father      History   Social History  . Marital Status: Widowed    Spouse Name: N/A    Number of Children: N/A  . Years of Education: N/A   Occupational History  . retired    Social History Main Topics  . Smoking status: Never Smoker   . Smokeless tobacco: Not on file  . Alcohol Use: No  . Drug Use: No  . Sexual Activity: Yes   Other Topics Concern  . Not on file   Social History Narrative  . No narrative on file     Review of Systems Constitutional: positive for fatigue    Cardiac: No chest pain or palpitations. History of "silent heart attack" years ago with negative stress test Lungs: Occasional cough. No shortness of breath Abdomen: Some chronic constipation. Colonoscopy up to date Skin: History of melanoma chest wall Musculoskeletal: Some chronic back and joint pain and arthritis and difficulty with mobility Psychiatric: Positive for anxiety Objective:    General: Alert, well-developed elderly Caucasian  female, in no distress Skin: Warm and dry without rash or infection. HEENT: No palpable masses or thyromegaly. Sclera nonicteric. Pupils equal round and reactive. Oropharynx clear. Breasts: Slight bruising just above the nipple on the right. No palpable masses in either breast. No palpable axillary adenopathy. Lymph nodes: No cervical, supraclavicular, or inguinal nodes palpable. Lungs: Breath sounds clear and equal without increased work of breathing Cardiovascular: Regular rate and rhythm without murmur. No JVD or edema. Peripheral pulses intact. Abdomen: Nondistended. Soft and nontender. No masses palpable. No organomegaly. No palpable hernias. Extremities: No edema or joint swelling or deformity. No chronic venous stasis changes. Neurologic: Alert and fully oriented. Gait normal.   Laboratory data:  CBC:  Lab Results  Component Value Date   WBC 8.8 01/04/2013   WBC 8.1 10/03/2012   RBC 4.22 01/04/2013   RBC 4.19 10/03/2012   HGB 12.7 01/04/2013   HGB 12.6 10/03/2012    HCT 37.7 01/04/2013   HCT 37.0 10/03/2012   PLT 300 01/04/2013   PLT 313.0 10/03/2012  ]  [facility] Labs:  Lab Results  Component Value Date   NA 138 01/04/2013   NA 134* 10/03/2012   K 3.9 01/04/2013   K 3.7 10/03/2012   CL 100 10/03/2012   CO2 22 01/04/2013   CO2 22 10/03/2012     BUN 10.3 01/04/2013   BUN 10 10/03/2012   CREATININE 1.0 01/04/2013   CREATININE 0.8 10/03/2012   CALCIUM 9.6 01/04/2013   CALCIUM 9.3 10/03/2012   PROT 6.4 01/04/2013   PROT 6.6 06/10/2012   BILITOT 1.01 01/04/2013   BILITOT 0.7 06/10/2012   BILIDIR 0.1 06/10/2012   ALKPHOS 52 01/04/2013   ALKPHOS 44 06/10/2012   AST 14 01/04/2013   AST 17 06/10/2012   ALT 16 01/04/2013   ALT 21 06/10/2012     Assessment  79 y.o. female with a new diagnosis of cancer of the the right breast 12:00 position.  Clinical 0, estrogen receptor negative. I discussed with the patient and family members present today initial surgical treatment options. We discussed options of breast conservation with lumpectomy or total mastectomy. Options for reconstruction were discussed. After discussion they have elected to proceed with right partial mastectomy.  We discussed the indications and nature of the procedure, and expected recovery, in detail. Surgical risks including anesthetic complications, cardiorespiratory complications, bleeding, infection, wound healing complications, blood clots, lymphedema, local and distant recurrence and possible need for further surgery based on the final pathology was discussed and understood.  Chemotherapy, hormonal therapy and radiation therapy have been discussed. They have been provided with literature regarding the treatment of breast cancer.  All questions were answered. They understand and agree to proceed and we will go ahead with scheduling.  Plan right partial mastectomy, needle localized, under general anesthesia as an outpatient  Philip Kotlyar T Ayla Dunigan MD, FACS  01/04/2013, 1:29 PM 

## 2013-01-04 NOTE — Progress Notes (Signed)
[name] [id number] [birth year] 79 y.o. 01/04/2013 1:57 PM  CC  Carrie Mew, MD [address] [place] [address] Dr. Chipper Herb  Dr. Glenna Fellows  REASON FOR CONSULTATION:  79 year old female with new diagnosis of right ductal carcinoma in situ. Patient is seen in the multidisciplinary breast clinic for discussion of treatment options.   STAGE:   Cancer of central portion of female breast   Primary site: Breast (Right)   Staging method: AJCC 7th Edition   Clinical: Stage 0 (Tis (DCIS), N0, cM0)   Summary: Stage 0 (Tis (DCIS), N0, cM0)  REFERRING PHYSICIAN: Dr. Glenna Fellows  HISTORY OF PRESENT ILLNESS:  [name] is a 79 y.o. female.  With past medical history significant for hypertension and diabetes. Patient also has multiple allergies to medications. Patient had a screening mammogram performed on 11/16/2012 which was suspicious for calcifications. She went on to have a diagnostic mammogram of the right breast on 12/12/2012, this revealed group of pleomorphic microcalcifications with linear forms (and arranged in a linear distribution) located within the anterior right breast at 12:00 position 1.2 cm superior to the nipple. The groups band 1.1 cm in greatest dimension. These were worrisome for DCIS. Because of this patient had a stereotactic biopsy performed on 12/26/2012. The pathology revealed ductal carcinoma in situ with calcifications high-grade estrogen receptor and progesterone receptor negative at 0% respectively. Patient had MRI of the breasts performed on 01/03/2013. In the right breast just above the nipple there was an area of non-mass enhancement associated with biopsy clip artifact. This area measured 1.7 x 1.2 x 0.9 cm. Lateral to this enhancement there were biopsy tract artifact. Elsewhere within the right breast no suspicious enhancement identified. Left breast no mass or abnormal enhancement. Lymph nodes no abnormal appearing lymph  nodes. Patient's case was discussed at the multidisciplinary breast conference. Radiology and pathology were reviewed. Patient is now seen in the multidisciplinary breast clinic for discussion of treatment options. She is accompanied by her sister and friend and she is without any complaints. She was also seen by Dr. Chipper Herb as well as Dr. Glenna Fellows.  Past Medical History: Past Medical History  Diagnosis Date  . Seasonal allergies   . Diabetes mellitus   . HTN (hypertension)   . Osteoarthritis   . Palpitations   . Anemia   . History of colonic polyps   . GERD (gastroesophageal reflux disease)   . Hyperlipidemia   . Chronic facial pain   . Vitamin B12 deficiency   . Dyslipidemia   . Chronic foot pain   . Chronic cough   . Obesity   . Anxiety   . Skin cancer     Past Surgical History: Past Surgical History  Procedure Laterality Date  . Colonoscopy    . Polypectomy      small adenomatous  . Abdominal hysterectomy    . Cholecystectomy    . Hernia repair      Family History: Family History  Problem Relation Age of Onset  . Colon cancer    . Heart disease Mother   . Diabetes Father   . Pancreatic cancer Father   . Bone cancer Sister   . Prostate cancer Brother   . Colon cancer Brother   . Rectal cancer Sister     Social History History  Substance Use Topics  . Smoking status: Never Smoker   . Smokeless tobacco: Not on file  . Alcohol Use: No    Allergies: Allergies  Allergen Reactions  . Sular [Nisoldipine Er]     "severe headaches, swelling eyes, hands, feet, shortness of breath, weak, flushed face, brain boiling, fluid retention, high blood sugar, nervous, heart fast beating"  . Actonel [Risedronate Sodium]   . Amlodipine Besylate   . Aspirin   . Atacand [Candesartan]   . Bextra [Valdecoxib]   . Bisoprolol-Hydrochlorothiazide   . Bystolic [Nebivolol Hcl]     "extreme weakness, heaviness in legs & arms, swelling in legs/arms/face, swollen  abdomen, pain in bladder, feet pain, soreness in chest"  . Candesartan Cilexetil   . Carbamazepine   . Cefadroxil   . Cefdinir   . Celecoxib   . Cholestyramine     "itching rash on stomach, bloated, nausea, vomiting, sleeplessness, extreme pain in arms"  . Clonidine   . Clonidine Hydrochloride   . Codeine   . Ezetimibe   . Hydrazine Yellow [Tartrazine]     "does not reduce high blood pressure, pain in arm, high pressure, felt like I was on verge of heart attack, really weak"  . Hydrocodone   . Hydrocodone-Acetaminophen   . Meloxicam   . Methylprednisolone Sodium Succinate   . Morphine   . Nabumetone   . Naproxen   . Niacin   . Niaspan [Niacin Er]     "fast heart beat, high blood pressure"  . Nisoldipine   . Norvasc [Amlodipine Besylate]     "extreme fluid retention/pain)  . Optivar [Azelastine Hcl]     "sensitive to light"  . Penicillins   . Pseudoephedrine-Guaifenesin   . Risedronate Sodium   . Rofecoxib   . Ru-Tuss [Chlorphen-Pse-Atrop-Hyos-Scop]   . Sulfonamide Derivatives   . Sulphur [Sulfur]   . Telithromycin   . Telmisartan     "headache, difficulty urinating, high blood sugar, fluid retention"  . Terfenadine   . Trandolapril-Verapamil Hcl Er   . Ziac [Bisoprolol-Hydrochlorothiazide]     "stopped urination"  . Iodinated Diagnostic Agents Rash    "All over"  . Valium [Diazepam] Other (See Comments)    Makes her mean and hyper    Current Medications: Current Outpatient Prescriptions  Medication Sig Dispense Refill  . calcium carbonate (OS-CAL) 600 MG TABS Take 600 mg by mouth 2 (two) times daily with a meal.        . cholecalciferol (VITAMIN D) 1000 UNITS tablet Take 1,000 Units by mouth daily at 6 (six) AM.       . ferrous sulfate 325 (65 FE) MG tablet Take by mouth.       . fish oil-omega-3 fatty acids 1000 MG capsule Take 1 g by mouth 2 (two) times daily.       Marland Kitchen MICRONIZED COLESTIPOL HCL 1 G tablet TAKE 2 TABLETS TWICE A DAY  360 tablet  3  .  sitaGLIPtan-metformin (JANUMET) 50-1000 MG per tablet TAKE 1 TABLET BY MOUTH TWO TIMES DAILY WITH A MEAL  180 tablet  3  . trandolapril-verapamil (TARKA) 2-180 MG per tablet Take 1 tablet by mouth daily.      Marland Kitchen ALPRAZolam (XANAX) 0.25 MG tablet Take 1 tablet (0.25 mg total) by mouth at bedtime as needed.  30 tablet  5  . Azelastine HCl (ASTEPRO) 0.15 % SOLN 1 spray by Nasal route 2 (two) times daily.        . beclomethasone (QVAR) 80 MCG/ACT inhaler Inhale 2 puffs into the lungs 2 (two) times daily.        . benzonatate (TESSALON) 100 MG capsule Take 1 capsule (100  mg total) by mouth 3 (three) times daily as needed.  270 capsule  1  . cyanocobalamin (,VITAMIN B-12,) 1000 MCG/ML injection INJECT 1 ML INTO THE MUSCLE EVERY 21 DAYS  10 mL  2  . Diclofenac Sodium 3 % GEL Place 1 application onto the skin 2 (two) times daily.  300 g  3  . fexofenadine (ALLEGRA) 60 MG tablet Take 60 mg by mouth 2 (two) times daily.        . furosemide (LASIX) 20 MG tablet Take 20 mg by mouth. 1 qd prn weakness      . hydrOXYzine (ATARAX) 25 MG tablet Take 25 mg by mouth 3 (three) times daily as needed.        . promethazine (PHENERGAN) 25 MG tablet Take 25 mg by mouth every 6 (six) hours as needed.        . ranitidine (ZANTAC) 150 MG capsule Take 150 mg by mouth 2 (two) times daily.        Marland Kitchen STARLIX 120 MG tablet TAKE 1 TABLET THREE TIMES A DAY BEFORE MEALS  270 tablet  2  . verapamil (CALAN) 40 MG tablet Take 1 tablet (40 mg total) by mouth 3 (three) times daily.  270 tablet  3   Current Facility-Administered Medications  Medication Dose Route Frequency Provider Last Rate Last Dose  . cyanocobalamin ((VITAMIN B-12)) injection 1,000 mcg  1,000 mcg Intramuscular Q30 days Stacie Glaze, MD   1,000 mcg at 02/13/11 5366    OB/GYN History: Menarche at age 86 patient underwent surgical menopause at 32 with a hysterectomy. She only received 2-3 months  of hormone replacement therapy. She is nulliparous she had one pregnancy  that ended up in miscarriage  Fertility Discussion: Not applicable Prior History of Cancer: Skin melanoma  Health Maintenance:  Colonoscopy yes  Bone Density no Last PAP smear unknown  ECOG PERFORMANCE STATUS: 1 - Symptomatic but completely ambulatory  Genetic Counseling/testing: no  REVIEW OF SYSTEMS:  A comprehensive review of systems was negative.  PHYSICAL EXAMINATION: Blood pressure 160/89, pulse 80, temperature 97.4 F (36.3 C), temperature source Oral, resp. rate 20, height 5' 7.5" (1.715 m), weight 141 lb (63.957 kg).  YQI:HKVQQ, healthy, no distress, well nourished and well developed SKIN: skin color, texture, turgor are normal HEAD: Normocephalic EYES: PERRLA, EOMI EARS: External ears normal OROPHARYNX:no exudate, no erythema and lips, buccal mucosa, and tongue normal  NECK: supple, no adenopathy LYMPH:  no palpable lymphadenopathy, no hepatosplenomegaly BREAST:left breast normal without mass, skin or nipple changes or axillary nodes, abnormal mass palpable in the right breast at the biopsy site consistent with hematoma LUNGS: clear to auscultation and percussion HEART: regular rate & rhythm ABDOMEN:abdomen soft, non-tender, normal bowel sounds and no masses or organomegaly BACK: No CVA tenderness EXTREMITIES:no clubbing, no cyanosis, left foot edema secondary to recent fall  NEURO: Grossly normal     STUDIES/RESULTS: Mr Breast Bilateral W Wo Contrast  01/03/2013   *RADIOLOGY REPORT*  Clinical Data:Newly diagnosed ductal carcinoma in situ following stereotactic guided core biopsy of pleomorphic calcifications in the right breast.  BUN and creatinine were obtained on site at [facility] at [address] Results:  BUN 9.0 mg/dL,  Creatinine 0.9 mg/dL.  BILATERAL BREAST MRI WITH AND WITHOUT CONTRAST  Technique: Multiplanar, multisequence MR images of both breasts were obtained prior to and following the intravenous administration of 13ml of Multihance.   THREE-DIMENSIONAL MR IMAGE RENDERING ON INDEPENDENT WORKSTATION: Three-dimensional MR images were rendered by post-processing  of the original MR data on an independent DynaCad workstation. The three-dimensional MR images were interpreted, and findings are reported in the following complete MRI report for this study.  Comparison:  Mammogram from the [facility] 12/26/2012 and earlier  FINDINGS:  Breast composition:  c:  Heterogeneous fibroglandular tissue  Background parenchymal enhancement: Minimal  Right breast:  Just above the right nipple, there is an area of non mass enhancement associated with biopsy clip artifact.  The enhancement shows slow wash-in/persistent type enhancement kinetics and measures 1.7 x 1.2 x 0.9 cm.  Lateral to this enhancement, there is biopsy tract artifact.  Findings are consistent with known area of ductal carcinoma in situ on recent biopsy.  Elsewhere within the right breast, no suspicious enhancement identified.  Left breast:  No mass or abnormal enhancement.  Lymph nodes:  No abnormal appearing lymph nodes.  Ancillary findings:  None.  IMPRESSION:  1.  Non mass enhancement within the area of known ductal carcinoma in situ in the right breast. 2.  No significant findings in the left breast.  RECOMMENDATION: Treatment plan  BI-RADS CATEGORY 6:  Known biopsy-proven malignancy - appropriate action should be taken.   Original Report Authenticated By: Norva Pavlov, M.D.   Mm [employer] R  12/12/2012   *RADIOLOGY REPORT*  Clinical Data:  Recall from screening mammography  DIGITAL DIAGNOSTIC RIGHT BREAST MAMMOGRAM  Comparison: Previous examinations.  Findings:  ACR Breast Density Category c:  The breast tissue is heterogeneously dense, which Shuster obscure small masses.  Magnification views the right breast demonstrate a group of pleomorphic microcalcifications with linear forms (and arranged in a linear distribution) located within the anterior right breast  at 12 o'clock position 1.2 cm superior to the nipple.  The group spans 1.1 cm in greatest dimension.  These calcifications are worrisome for DCIS.  I have discussed stereotactic core biopsy with the patient.  Per patient preference the patient has been scheduled for 12/26/2012 and 03:00 p.m.  IMPRESSION: Suspicious group of calcifications located within the right breast at 12 o'clock position (1.1 cm in diameter).  Stereotactic core biopsy is recommended and has been scheduled for 12/26/2012.  RECOMMENDATION: Right breast stereotactic core biopsy.  I have discussed the findings and recommendations with the patient. Results were also provided in writing at the conclusion of the visit.  If applicable, a reminder letter will be sent to the patient regarding her next appointment.  BI-RADS CATEGORY 4:  Suspicious abnormality - biopsy should be considered.   Original Report Authenticated By: Rolla Plate, M.D.   Mm [facility] Mgmt  12/27/2012   *RADIOLOGY REPORT*  ESTABLISHED PATIENT OFFICE VISIT - LEVEL III [phone])  Chief Complaint:  Patient status post right breast biopsy.  History:  Patient with right breast suspicious calcifications. A right breast biopsy of these calcifications was then performed.  Review of Systems:  Nothing pertinent.  Exam:  Patient is in no acute distress. A small amount of blood is seen at the biopsy site. No erythema.  Pathology:  Ductal carcinoma in situ with calcifications.  Assessment and Plan:  Patient with right breast calcifications status post core biopsy, with pathology demonstrating DCIS. This is a concordant result. The patient has been given a breast MRI appointment for 01/03/2013 and a multidisciplinary clinic appointment for 01/04/2013.   Original Report Authenticated By: Jerene Dilling, M.D.   Mm Rt Breast Bx W Loc Dev 1st Lesion Image Bx Spec Stereo Guide  12/26/2012   *RADIOLOGY  REPORT*  Clinical Data:  Suspicious right breast calcifications for biopsy   STEREOTACTIC-GUIDED VACUUM ASSISTED BIOPSY OF THE RIGHT BREAST AND SPECIMEN RADIOGRAPH  Comparison: Previous exams.  I met with the patient and we discussed the procedure of stereotactic-guided biopsy, including benefits and alternatives. We discussed the high likelihood of a successful procedure. We discussed the risks of the procedure, including infection, bleeding, tissue injury, clip migration, and inadequate sampling. Informed, written consent was given. The usual time-out protocol was performed immediately prior to the procedure.  Using sterile technique and 2% Lidocaine as local anesthetic, under stereotactic guidance, a 9 gauge vacuum-assisted device was used to perform core needle biopsy of right breast calcifications using a lateral approach.  Specimen radiograph was performed, showing inclusion of calcifications of concern.  Specimens with calcifications are identified for pathology.  At the conclusion of the procedure, a  tissue marker clip was deployed into the biopsy cavity.  Follow-up 2-view mammogram confirmed clip 9 mm cranial to the clustered calcifications.  IMPRESSION: Stereotactic-guided biopsy of right breast.  No apparent complications.   Original Report Authenticated By: Sherian Rein, M.D.     LABS:    Chemistry      Component Value Date/Time   NA 138 01/04/2013 1212   NA 134* 10/03/2012 1421   K 3.9 01/04/2013 1212   K 3.7 10/03/2012 1421   CL 100 10/03/2012 1421   CO2 22 01/04/2013 1212   CO2 22 10/03/2012 1421   BUN 10.3 01/04/2013 1212   BUN 10 10/03/2012 1421   CREATININE 1.0 01/04/2013 1212   CREATININE 0.8 10/03/2012 1421      Component Value Date/Time   CALCIUM 9.6 01/04/2013 1212   CALCIUM 9.3 10/03/2012 1421   ALKPHOS 52 01/04/2013 1212   ALKPHOS 44 06/10/2012 1041   AST 14 01/04/2013 1212   AST 17 06/10/2012 1041   ALT 16 01/04/2013 1212   ALT 21 06/10/2012 1041   BILITOT 1.01 01/04/2013 1212   BILITOT 0.7 06/10/2012 1041      Lab Results  Component Value Date   WBC 8.8 01/04/2013   HGB  12.7 01/04/2013   HCT 37.7 01/04/2013   MCV 89.2 01/04/2013   PLT 300 01/04/2013       PATHOLOGY: ADDITIONAL INFORMATION: PROGNOSTIC INDICATORS - ACIS Results: IMMUNOHISTOCHEMICAL AND MORPHOMETRIC ANALYSIS BY THE AUTOMATED CELLULAR IMAGING SYSTEM (ACIS) Estrogen Receptor: 0%, NEGATIVE Progesterone Receptor: 0%, NEGATIVE COMMENT: The negative hormone receptor study(ies) in this case have an internal positive control. REFERENCE RANGE ESTROGEN RECEPTOR NEGATIVE <1% POSITIVE =>1% PROGESTERONE RECEPTOR NEGATIVE <1% POSITIVE =>1% All controls stained appropriately Abigail Miyamoto MD Pathologist, Electronic Signature ( Signed 01/03/2013) FINAL DIAGNOSIS Diagnosis Breast, right, needle core biopsy - DUCTAL CARCINOMA IN SITU WITH CALCIFICATIONS. - SEE COMMENT. 1 of 2 FINAL for Marsiglia, Mila S [phone]) Microscopic Comment The carcinoma is high grade. Estrogen receptor and progesterone receptor studies will be performed and the results reported separately. The results were called to the [facility] on 12/27/2012. (JBK:kh 12-27-12)  ASSESSMENT    79 year old female with  #1 new diagnosis of high-grade ductal carcinoma in situ with calcifications of the right breast found on a screening mammogram. The core needle biopsy performed revealed estrogen receptor and progesterone receptor negative disease. Patient did have MRIs performed that showed a 1.7 cm area of concern. Patient is a good candidate for lumpectomy. She was seen by Dr. Glenna Fellows and Dr. Chipper Herb. Post lumpectomy patient would be a good candidate for radiation therapy.  #  2 because patient's tumor is ER negative we would not use antiestrogen therapy. But she will be followed clinically and radiographically.  Clinical Trial Eligibility: no Multidisciplinary conference discussion yes     PLAN:    #1 patient will proceed with lumpectomy initially.  #2 I will see her back in 2 months time for  followup.  #3 we will get an x-ray of the left foot to make sure she does not have a fracture.       Discussion: Patient is being treated per NCCN breast cancer care guidelines appropriate for stage.0   Thank you so much for allowing me to participate in the care of [name]. I will continue to follow up the patient with you and assist in her care.  All questions were answered. The patient knows to call the clinic with any problems, questions or concerns. We can certainly see the patient much sooner if necessary.  I spent 30 minutes counseling the patient face to face. The total time spent in the appointment was 30 minutes.  Drue Second, MD Medical/Oncology [facility] [phone] (beeper) [phone] (Office)  01/04/2013, 1:57 PM

## 2013-01-04 NOTE — Progress Notes (Signed)
Checked in new patient with no financial issues. Mail and phone only for communication and ok to leave message.I didn't ask if living will or poa. She did have her [facility] form °

## 2013-01-04 NOTE — Progress Notes (Signed)
[facility] Radiation Oncology NEW PATIENT EVALUATION  Name: [name] MRN: [id number]  Date:   01/04/2013           DOB: 26-Oct-1929  Status: outpatient   CC: Carrie Mew, MD  Hoxworth, Lorne Skeens, MD    REFERRING PHYSICIAN: Mariella Saa, MD   DIAGNOSIS: Stage 0 (Tis N0 M0) DCIS of the right breast   HISTORY OF PRESENT ILLNESS:  [name] is a 79 y.o. female who is seen today for the courtesy of Dr. Johna Sheriff at the [facility] for evaluation of her DCIS of the right breast. At the time of a screening mammogram on 11/16/2012 she was noted to have suspicious calcifications within the right breast. Additional views on 12/12/2012 showed a suspicious group of calcifications within the right breast at 12:00 over 1.1 cm. Stereotactic biopsy on 12/26/2012 was diagnostic for DCIS with calcifications. The DCIS was high grade and ER and PR negative. She is without complaints today. She seen today with Dr. Johna Sheriff and Dr. Welton Flakes.  PREVIOUS RADIATION THERAPY: No   PAST MEDICAL HISTORY:  has a past medical history of Seasonal allergies; Diabetes mellitus; HTN (hypertension); Osteoarthritis; Palpitations; Anemia; History of colonic polyps; GERD (gastroesophageal reflux disease); Hyperlipidemia; Chronic facial pain; Vitamin B12 deficiency; Dyslipidemia; Chronic foot pain; Chronic cough; Obesity; Anxiety; and Skin cancer.     PAST SURGICAL HISTORY:  Past Surgical History  Procedure Laterality Date  . Colonoscopy    . Polypectomy      small adenomatous  . Abdominal hysterectomy    . Cholecystectomy    . Hernia repair       FAMILY HISTORY: family history includes Bone cancer in her sister; Colon cancer in her brother and another family member; Diabetes in her father; Heart disease in her mother; Pancreatic cancer in her father; Prostate cancer in her brother; Rectal cancer in her sister. Her father died of pancreatic cancer 23, in her mother died following a stroke at 64. No  family history of breast cancer   SOCIAL HISTORY:  reports that she has never smoked. She does not have any smokeless tobacco history on file. She reports that she does not drink alcohol or use illicit drugs. Widowed for the past 8 years, no children. 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   ALLERGIES: Sular; Actonel; Amlodipine besylate; Aspirin; Atacand; Bextra; Bisoprolol-hydrochlorothiazide; Bystolic; Candesartan cilexetil; Carbamazepine; Cefadroxil; Cefdinir; Celecoxib; Cholestyramine; Clonidine; Clonidine hydrochloride; Codeine; Ezetimibe; Hydrazine yellow; Hydrocodone; Hydrocodone-acetaminophen; Meloxicam; Methylprednisolone sodium succinate; Morphine; Nabumetone; Naproxen; Niacin; Niaspan; Nisoldipine; Norvasc; Optivar; Penicillins; Pseudoephedrine-guaifenesin; Risedronate sodium; Rofecoxib; Ru-tuss; Sulfonamide derivatives; Sulphur; Telithromycin; Telmisartan; Terfenadine; Trandolapril-verapamil hcl er; Ziac; Iodinated diagnostic agents; and Valium   MEDICATIONS:  Current Outpatient Prescriptions  Medication Sig Dispense Refill  . ALPRAZolam (XANAX) 0.25 MG tablet Take 1 tablet (0.25 mg total) by mouth at bedtime as needed.  30 tablet  5  . Azelastine HCl (ASTEPRO) 0.15 % SOLN 1 spray by Nasal route 2 (two) times daily.        . beclomethasone (QVAR) 80 MCG/ACT inhaler Inhale 2 puffs into the lungs 2 (two) times daily.        . benzonatate (TESSALON) 100 MG capsule Take 1 capsule (100 mg total) by mouth 3 (three) times daily as needed.  270 capsule  1  . calcium carbonate (OS-CAL) 600 MG TABS Take 600 mg by mouth 2 (two) times daily with a meal.        . cholecalciferol (VITAMIN D) 1000 UNITS  tablet Take 1,000 Units by mouth daily at 6 (six) AM.       . cyanocobalamin (,VITAMIN B-12,) 1000 MCG/ML injection INJECT 1 ML INTO THE MUSCLE EVERY 21 DAYS  10 mL  2  . Diclofenac Sodium 3 % GEL Place 1 application onto the skin 2 (two) times daily.  300 g  3  . ferrous sulfate 325 (65 FE) MG  tablet Take by mouth.       . fexofenadine (ALLEGRA) 60 MG tablet Take 60 mg by mouth 2 (two) times daily.        . fish oil-omega-3 fatty acids 1000 MG capsule Take 1 g by mouth 2 (two) times daily.       . furosemide (LASIX) 20 MG tablet Take 20 mg by mouth. 1 qd prn weakness      . hydrOXYzine (ATARAX) 25 MG tablet Take 25 mg by mouth 3 (three) times daily as needed.        Marland Kitchen MICRONIZED COLESTIPOL HCL 1 G tablet TAKE 2 TABLETS TWICE A DAY  360 tablet  3  . promethazine (PHENERGAN) 25 MG tablet Take 25 mg by mouth every 6 (six) hours as needed.        . ranitidine (ZANTAC) 150 MG capsule Take 150 mg by mouth 2 (two) times daily.        . sitaGLIPtan-metformin (JANUMET) 50-1000 MG per tablet TAKE 1 TABLET BY MOUTH TWO TIMES DAILY WITH A MEAL  180 tablet  3  . STARLIX 120 MG tablet TAKE 1 TABLET THREE TIMES A DAY BEFORE MEALS  270 tablet  2  . trandolapril-verapamil (TARKA) 2-180 MG per tablet Take 1 tablet by mouth daily.      . verapamil (CALAN) 40 MG tablet Take 1 tablet (40 mg total) by mouth 3 (three) times daily.  270 tablet  3   Current Facility-Administered Medications  Medication Dose Route Frequency Provider Last Rate Last Dose  . cyanocobalamin ((VITAMIN B-12)) injection 1,000 mcg  1,000 mcg Intramuscular Q30 days Stacie Glaze, MD   1,000 mcg at 02/13/11 1610     REVIEW OF SYSTEMS:  Pertinent items are noted in HPI.    PHYSICAL EXAM:   Alert and oriented 79 year-old white female appearing her stated age. Wt Readings from Last 3 Encounters:  01/04/13 141 lb (63.957 kg)  10/03/12 146 lb (66.225 kg)  06/27/12 149 lb (67.586 kg)   Temp Readings from Last 3 Encounters:  01/04/13 97.4 F (36.3 C) Oral  10/03/12 98.3 F (36.8 C)   06/27/12 98.2 F (36.8 C)    BP Readings from Last 3 Encounters:  01/04/13 160/89  10/03/12 136/80  06/27/12 184/90   Pulse Readings from Last 3 Encounters:  01/04/13 80  10/03/12 76  06/27/12 88   Head and neck examination: Grossly  unremarkable. Nodes: Without palpable cervical, supraclavicular, or axillary lymphadenopathy. Chest: Lungs clear. Back: Without spinal or CVA tenderness. Heart: Regular in rhythm. Breasts: There is a punctate biopsy wound at approximately 11:00 along the right breast. No masses are appreciated. Left breast without masses or lesions. Abdomen without hepatomegaly. Extremities: Without edema. Neurologic examination: Grossly nonfocal.    LABORATORY DATA:  Lab Results  Component Value Date   WBC 8.8 01/04/2013   HGB 12.7 01/04/2013   HCT 37.7 01/04/2013   MCV 89.2 01/04/2013   PLT 300 01/04/2013   Lab Results  Component Value Date   NA 138 01/04/2013   K 3.9 01/04/2013   CL 100  10/03/2012   CO2 22 01/04/2013   Lab Results  Component Value Date   ALT 16 01/04/2013   AST 14 01/04/2013   ALKPHOS 52 01/04/2013   BILITOT 1.01 01/04/2013      IMPRESSION: Stage 0 (Tis N0 M0) high-grade DCIS. I explained to the patient and her family that her local treatment options include mastectomy versus partial mastectomy followed by radiation therapy. Since she is hormone receptor negative with high-grade DCIS, she should receive post operative radiation therapy. I would consider hypo-fractionated radiation therapy in view of her age. We discussed the potential acute and late toxicities of radiation therapy. Her prognosis is quite favorable. Lastly, I would obtain a post surgical right breast mammogram to confirm removal of all suspicious microcalcifications before initiation of radiation therapy.   PLAN: As discussed above.  I spent 40 minutes minutes face to face with the patient and more than 50% of that time was spent in counseling and/or coordination of care.

## 2013-01-04 NOTE — Patient Instructions (Addendum)
We discussed your pathology and radiology.  We discussed treatment options including surgery with a lumpectomy for early stage breast cancer (stage 0).  Post surgery you will need radiation therapy to help prevent recurrence locally of the breast cancer.  Since tumor is estrogen receptor negative you will not need any antiestrogen therapy. However I will continue to see you periodically for followup.  I will see you back in 2 months time in followup

## 2013-01-05 ENCOUNTER — Emergency Department (HOSPITAL_COMMUNITY)
Admission: EM | Admit: 2013-01-05 | Discharge: 2013-01-05 | Disposition: A | Discharge status: Home / Self Care | Payer: Medicare Other | Attending: Emergency Medicine | Admitting: Emergency Medicine

## 2013-01-05 ENCOUNTER — Telehealth (INDEPENDENT_AMBULATORY_CARE_PROVIDER_SITE_OTHER): Payer: Self-pay | Admitting: General Surgery

## 2013-01-05 ENCOUNTER — Telehealth: Payer: Self-pay | Admitting: *Deleted

## 2013-01-05 ENCOUNTER — Other Ambulatory Visit: Payer: Self-pay

## 2013-01-05 DIAGNOSIS — Z880 Allergy status to penicillin: Secondary | ICD-10-CM | POA: Diagnosis not present

## 2013-01-05 DIAGNOSIS — G8929 Other chronic pain: Secondary | ICD-10-CM | POA: Diagnosis not present

## 2013-01-05 DIAGNOSIS — E669 Obesity, unspecified: Secondary | ICD-10-CM | POA: Insufficient documentation

## 2013-01-05 DIAGNOSIS — E119 Type 2 diabetes mellitus without complications: Secondary | ICD-10-CM | POA: Diagnosis not present

## 2013-01-05 DIAGNOSIS — Z862 Personal history of diseases of the blood and blood-forming organs and certain disorders involving the immune mechanism: Secondary | ICD-10-CM | POA: Diagnosis not present

## 2013-01-05 DIAGNOSIS — I10 Essential (primary) hypertension: Secondary | ICD-10-CM | POA: Diagnosis not present

## 2013-01-05 DIAGNOSIS — Z85828 Personal history of other malignant neoplasm of skin: Secondary | ICD-10-CM | POA: Diagnosis not present

## 2013-01-05 DIAGNOSIS — D649 Anemia, unspecified: Secondary | ICD-10-CM | POA: Insufficient documentation

## 2013-01-05 DIAGNOSIS — Z860100 Personal history of colon polyps, unspecified: Secondary | ICD-10-CM | POA: Insufficient documentation

## 2013-01-05 DIAGNOSIS — K219 Gastro-esophageal reflux disease without esophagitis: Secondary | ICD-10-CM | POA: Insufficient documentation

## 2013-01-05 DIAGNOSIS — S92309A Fracture of unspecified metatarsal bone(s), unspecified foot, initial encounter for closed fracture: Secondary | ICD-10-CM | POA: Diagnosis not present

## 2013-01-05 DIAGNOSIS — Z8639 Personal history of other endocrine, nutritional and metabolic disease: Secondary | ICD-10-CM | POA: Insufficient documentation

## 2013-01-05 DIAGNOSIS — Y9389 Activity, other specified: Secondary | ICD-10-CM | POA: Insufficient documentation

## 2013-01-05 DIAGNOSIS — F411 Generalized anxiety disorder: Secondary | ICD-10-CM | POA: Insufficient documentation

## 2013-01-05 DIAGNOSIS — R296 Repeated falls: Secondary | ICD-10-CM | POA: Insufficient documentation

## 2013-01-05 DIAGNOSIS — S92302A Fracture of unspecified metatarsal bone(s), left foot, initial encounter for closed fracture: Secondary | ICD-10-CM

## 2013-01-05 DIAGNOSIS — Y92009 Unspecified place in unspecified non-institutional (private) residence as the place of occurrence of the external cause: Secondary | ICD-10-CM | POA: Insufficient documentation

## 2013-01-05 LAB — CBC WITH DIFFERENTIAL/PLATELET
Basophils Absolute: 0 10*3/uL (ref 0.0–0.1)
Basophils Relative: 0 % (ref 0–1)
Eosinophils Absolute: 0.1 10*3/uL (ref 0.0–0.7)
Eosinophils Relative: 1 % (ref 0–5)
HCT: 34.8 % — ABNORMAL LOW (ref 36.0–46.0)
Hemoglobin: 11.9 g/dL — ABNORMAL LOW (ref 12.0–15.0)
Lymphocytes Relative: 24 % (ref 12–46)
Lymphs Abs: 1.8 10*3/uL (ref 0.7–4.0)
MCH: 30.1 pg (ref 26.0–34.0)
MCHC: 34.2 g/dL (ref 30.0–36.0)
MCV: 87.9 fL (ref 78.0–100.0)
Monocytes Absolute: 0.7 10*3/uL (ref 0.1–1.0)
Monocytes Relative: 10 % (ref 3–12)
Neutro Abs: 4.9 10*3/uL (ref 1.7–7.7)
Neutrophils Relative %: 65 % (ref 43–77)
Platelets: 255 10*3/uL (ref 150–400)
RBC: 3.96 MIL/uL (ref 3.87–5.11)
RDW: 13.4 % (ref 11.5–15.5)
WBC: 7.5 10*3/uL (ref 4.0–10.5)

## 2013-01-05 LAB — URINALYSIS, ROUTINE W REFLEX MICROSCOPIC
Bilirubin Urine: NEGATIVE
Glucose, UA: 100 mg/dL — AB
Hgb urine dipstick: NEGATIVE
Ketones, ur: NEGATIVE mg/dL
Leukocytes, UA: NEGATIVE
Nitrite: NEGATIVE
Protein, ur: NEGATIVE mg/dL
Specific Gravity, Urine: 1.022 (ref 1.005–1.030)
Urobilinogen, UA: 0.2 mg/dL (ref 0.0–1.0)
pH: 5 (ref 5.0–8.0)

## 2013-01-05 LAB — GLUCOSE, CAPILLARY: Glucose-Capillary: 145 mg/dL — ABNORMAL HIGH (ref 70–99)

## 2013-01-05 LAB — POCT I-STAT, CHEM 8
BUN: 10 mg/dL (ref 6–23)
Calcium, Ion: 1.23 mmol/L (ref 1.13–1.30)
Chloride: 101 mEq/L (ref 96–112)
Creatinine, Ser: 0.8 mg/dL (ref 0.50–1.10)
Glucose, Bld: 167 mg/dL — ABNORMAL HIGH (ref 70–99)
HCT: 35 % — ABNORMAL LOW (ref 36.0–46.0)
Hemoglobin: 11.9 g/dL — ABNORMAL LOW (ref 12.0–15.0)
Potassium: 4.2 mEq/L (ref 3.5–5.1)
Sodium: 135 mEq/L (ref 135–145)
TCO2: 23 mmol/L (ref 0–100)

## 2013-01-05 MED ORDER — ACETAMINOPHEN 500 MG PO TABS
1000.0000 mg | ORAL_TABLET | Freq: Once | ORAL | Status: AC
  Administered 2013-01-05: 1000 mg via ORAL
  Filled 2013-01-05: qty 2

## 2013-01-05 NOTE — ED Provider Notes (Signed)
CSN: [id number]     Arrival date & time 01/05/13  1523 History   First MD Initiated Contact with Patient 01/05/13 1617     Chief Complaint  Patient presents with  . Foot Injury  . Fall   (Consider location/radiation/quality/duration/timing/severity/associated sxs/prior Treatment) HPI Comments: Patient is an 79 year old female with a history of hypertension, diabetes mellitus, palpitations, and ductal carcinoma in situ who presents for left foot pain x2 days. Patient states that she was at home when she next remembers realizing that she was on the floor. Patient states she was unable to get up from this position but called over to a couch. Shortly after patient noticed her left foot pain. Pain has been constant and aching since this time with intermittent sharp pains with weightbearing. Patient is to associated ecchymosis and swelling and denies associated numbness, tingling, pallor, or her foot being cold to touch. Patient went to see her oncologist yesterday for a followup who ordered an x-ray of her foot which was significant for fractures to her second through fifth metatarsals. Patient received a call from her oncologist this morning requesting that she come to the ED for further evaluation. When questioned further, patient is unable to explain how she ended up on her kitchen floor 2 days ago. She denies any syncope or falls since this time as well as any lightheadedness or dizziness, headache, visual disturbances, difficulty speaking or swallowing, extremity weakness, and N/V. Patient denies use of blood thinners.  Patient is a 79 y.o. female presenting with foot injury and fall. The history is provided by the patient. No language interpreter was used.  Foot Injury Associated symptoms: no fever   Fall Associated symptoms include arthralgias. Pertinent negatives include no fever, numbness or weakness.    Past Medical History  Diagnosis Date  . Seasonal allergies   . Diabetes mellitus   . HTN  (hypertension)   . Osteoarthritis   . Palpitations   . Anemia   . History of colonic polyps   . GERD (gastroesophageal reflux disease)   . Hyperlipidemia   . Chronic facial pain   . Vitamin B12 deficiency   . Dyslipidemia   . Chronic foot pain   . Chronic cough   . Obesity   . Anxiety   . Skin cancer    Past Surgical History  Procedure Laterality Date  . Colonoscopy    . Polypectomy      small adenomatous  . Abdominal hysterectomy    . Cholecystectomy    . Hernia repair     Family History  Problem Relation Age of Onset  . Colon cancer    . Heart disease Mother   . Diabetes Father   . Pancreatic cancer Father   . Bone cancer Sister   . Prostate cancer Brother   . Colon cancer Brother   . Rectal cancer Sister    History  Substance Use Topics  . Smoking status: Never Smoker   . Smokeless tobacco: Not on file  . Alcohol Use: No   OB History   Grav Para Term Preterm Abortions TAB SAB Ect Mult Living                 Review of Systems  Constitutional: Negative for fever.  Musculoskeletal: Positive for arthralgias.  Skin: Positive for color change. Negative for pallor.  Neurological: Negative for weakness and numbness.  All other systems reviewed and are negative.   Allergies  Sular; Actonel; Amlodipine besylate; Aspirin; Atacand; Bextra; Bisoprolol-hydrochlorothiazide; Bystolic;  Candesartan cilexetil; Carbamazepine; Cefadroxil; Cefdinir; Celecoxib; Cholestyramine; Clonidine; Clonidine hydrochloride; Codeine; Ezetimibe; Hydrazine yellow; Hydrocodone; Hydrocodone-acetaminophen; Meloxicam; Methylprednisolone sodium succinate; Morphine; Nabumetone; Naproxen; Niacin; Niaspan; Nisoldipine; Norvasc; Optivar; Penicillins; Pseudoephedrine-guaifenesin; Risedronate sodium; Rofecoxib; Ru-tuss; Sulfonamide derivatives; Sulphur; Telithromycin; Telmisartan; Terfenadine; Trandolapril-verapamil hcl er; Ziac; Iodinated diagnostic agents; and Valium  Home Medications   Current  Outpatient Rx  Name  Route  Sig  Dispense  Refill  . ALPRAZolam (XANAX) 0.25 MG tablet   Oral   Take 1 tablet (0.25 mg total) by mouth at bedtime as needed.   30 tablet   5   . Azelastine HCl (ASTEPRO) 0.15 % SOLN   Nasal   1 spray by Nasal route 2 (two) times daily.           . beclomethasone (QVAR) 80 MCG/ACT inhaler   Inhalation   Inhale 2 puffs into the lungs 2 (two) times daily.           . benzonatate (TESSALON) 100 MG capsule   Oral   Take 1 capsule (100 mg total) by mouth 3 (three) times daily as needed.   270 capsule   1   . calcium carbonate (OS-CAL) 600 MG TABS   Oral   Take 600 mg by mouth 2 (two) times daily with a meal.           . cholecalciferol (VITAMIN D) 1000 UNITS tablet   Oral   Take 1,000 Units by mouth daily at 6 (six) AM.          . cyanocobalamin (,VITAMIN B-12,) 1000 MCG/ML injection      INJECT 1 ML INTO THE MUSCLE EVERY 21 DAYS   10 mL   2   . Diclofenac Sodium 3 % GEL   Transdermal   Place 1 application onto the skin 2 (two) times daily.   300 g   3   . ferrous sulfate 325 (65 FE) MG tablet   Oral   Take by mouth.          . fexofenadine (ALLEGRA) 60 MG tablet   Oral   Take 60 mg by mouth 2 (two) times daily.           . fish oil-omega-3 fatty acids 1000 MG capsule   Oral   Take 1 g by mouth 2 (two) times daily.          . furosemide (LASIX) 20 MG tablet   Oral   Take 20 mg by mouth. 1 qd prn weakness         . hydrOXYzine (ATARAX) 25 MG tablet   Oral   Take 25 mg by mouth 3 (three) times daily as needed.           Marland Kitchen MICRONIZED COLESTIPOL HCL 1 G tablet      TAKE 2 TABLETS TWICE A DAY   360 tablet   3   . promethazine (PHENERGAN) 25 MG tablet   Oral   Take 25 mg by mouth every 6 (six) hours as needed.           . ranitidine (ZANTAC) 150 MG capsule   Oral   Take 150 mg by mouth 2 (two) times daily.           . sitaGLIPtan-metformin (JANUMET) 50-1000 MG per tablet      TAKE 1 TABLET BY MOUTH  TWO TIMES DAILY WITH A MEAL   180 tablet   3   . STARLIX 120 MG tablet  TAKE 1 TABLET THREE TIMES A DAY BEFORE MEALS   270 tablet   2   . trandolapril-verapamil (TARKA) 2-180 MG per tablet   Oral   Take 1 tablet by mouth daily.         . verapamil (CALAN) 40 MG tablet   Oral   Take 1 tablet (40 mg total) by mouth 3 (three) times daily.   270 tablet   3    BP 146/104  Pulse 70  Resp 18  SpO2 98%  Physical Exam  Nursing note and vitals reviewed. Constitutional: She is oriented to person, place, and time. She appears well-developed and well-nourished. No distress.  HENT:  Head: Normocephalic and atraumatic.  Eyes: Conjunctivae and EOM are normal. No scleral icterus.  Neck: Normal range of motion.  Cardiovascular: Normal rate, regular rhythm and intact distal pulses.   Distal radial, dorsalis pedis, and posterior tibial pulses 2+ bilaterally. Capillary refill normal and bilateral lower extremities.  Pulmonary/Chest: Effort normal. No respiratory distress.  Musculoskeletal:       Left foot: She exhibits decreased range of motion, tenderness, bony tenderness and swelling. She exhibits normal capillary refill and no crepitus.       Feet:  Diffuse swelling and ecchymosis of the dorsal aspect of the left foot extending from the base of the phalanges to the left ankle. Slight decreased range of motion secondary to discomfort.  Neurological: She is alert and oriented to person, place, and time. No sensory deficit. GCS eye subscore is 4. GCS verbal subscore is 5. GCS motor subscore is 6.  No sensory or motor deficits appreciated. Patient is ambulatory with a walker. DTRs normal and symmetric.  Skin: Skin is warm and dry. No rash noted. She is not diaphoretic. No erythema. No pallor.  Psychiatric: She has a normal mood and affect. Her behavior is normal.   ED Course  Procedures (including critical care time) Labs Review Labs Reviewed  CBC WITH DIFFERENTIAL - Abnormal;  Notable for the following:    Hemoglobin 11.9 (*)    HCT 34.8 (*)    All other components within normal limits  URINALYSIS, ROUTINE W REFLEX MICROSCOPIC - Abnormal; Notable for the following:    Glucose, UA 100 (*)    All other components within normal limits  GLUCOSE, CAPILLARY - Abnormal; Notable for the following:    Glucose-Capillary 145 (*)    All other components within normal limits  POCT I-STAT, CHEM 8 - Abnormal; Notable for the following:    Glucose, Bld 167 (*)    Hemoglobin 11.9 (*)    HCT 35.0 (*)    All other components within normal limits   Imaging Review Dg Foot Complete Left  01/04/2013   *RADIOLOGY REPORT*  Clinical Data: A fall with swelling of left foot yesterday  LEFT FOOT - COMPLETE 3+ VIEW  Comparison: None.  Findings: There are angulated fractures of the distal necks of the second , third , fourth and fifth metatarsals.  These all show apex medial angulation.  There are no other fractures.  There is a very small heel spur.  IMPRESSION: Fractures of the second through fifth metatarsals.   Original Report Authenticated By: Esperanza Heir, M.D.    Date: 01/05/2013  Rate: 57  Rhythm: normal sinus rhythm and premature atrial contractions (PAC)  QRS Axis: normal  Intervals: normal  ST/T Wave abnormalities: normal  Conduction Disutrbances:none  Narrative Interpretation: Sinus bradycardia with 2 PACs; no STEMI  Old EKG Reviewed: none available I have  personally reviewed and interpreted this EKG  MDM   1. Metatarsal fracture, left, closed, initial encounter    78 y/o female who presents for L foot pain x 2 days after a fall at home. Given story provided by patient and her inability to recall events leading up to her ending up on her kitchen floor, suspect syncopal episode. Patient lives alone in a house. Will w/u with CBC, Chem 8, and UA as well as EKG to assess for possible cause of syncope. Have discussed CT head with Dr. Oletta Lamas; do not believe CT indicated at this  time as fall occurred 2 days ago and patient has been ambulatory since, without focal neurologic deficits, and without recurrence of syncope.   EKG unremarkable. CBC without leukocytosis. There is no anemia or electrolyte imbalance. CBG stable at 145. UA without evidence of infection. Believe further work up able to be pursued as outpatient by patient's PCP. Have spoken with Dr. Luiz Blare who has reviewed the X-rays and L 2nd-5th metatarsal fractures; recommends WBAT with Cam Walker and outpatient follow up in office Monday or Tuesday. Cam walker applied in ED. Patient appropriate for d/c with orthopedic and PCP follow up. Indications for return discussed and patient agreeable to plan with no unaddressed concerns. Patient work up, assessment, and management plan discussed with Dr. Oletta Lamas prior to d/c who is in agreement.   Antony Madura, PA-C 01/05/13 2112

## 2013-01-05 NOTE — ED Notes (Signed)
Pt states she fell yesterday in her kitchen. Pt doesn't know why she fell. Pt went to her MDs office yesterday and they x-rayed her L foot. Pt states she fractures in her L foot. Pt has bruising and swelling to L foot. Pt ambulates with walker. Pt states she has been taking Tylenol for her pain. Pt states she wants "an orthopedist to look at my foot". Pt denies headache, dizziness, n/v. Pt a/o x 4.

## 2013-01-05 NOTE — Telephone Encounter (Signed)
[facility] [place] states no needle localization orders

## 2013-01-05 NOTE — Telephone Encounter (Signed)
sw pt gv  appt for 03/10/13 @ 12:30p. Pt is aware that i will mail a letter/cal...td

## 2013-01-06 ENCOUNTER — Other Ambulatory Visit (INDEPENDENT_AMBULATORY_CARE_PROVIDER_SITE_OTHER): Payer: Self-pay | Admitting: General Surgery

## 2013-01-06 DIAGNOSIS — D0511 Intraductal carcinoma in situ of right breast: Secondary | ICD-10-CM

## 2013-01-06 NOTE — ED Provider Notes (Signed)
Medical screening examination/treatment/procedure(s) were performed by non-physician practitioner and as supervising physician I was immediately available for consultation/collaboration.  [name]. Oletta Lamas, MD 01/06/13 4098

## 2013-01-09 DIAGNOSIS — S43429A Sprain of unspecified rotator cuff capsule, initial encounter: Secondary | ICD-10-CM | POA: Diagnosis not present

## 2013-01-09 DIAGNOSIS — S92309A Fracture of unspecified metatarsal bone(s), unspecified foot, initial encounter for closed fracture: Secondary | ICD-10-CM | POA: Diagnosis not present

## 2013-01-10 DIAGNOSIS — S92309A Fracture of unspecified metatarsal bone(s), unspecified foot, initial encounter for closed fracture: Secondary | ICD-10-CM | POA: Diagnosis not present

## 2013-01-11 ENCOUNTER — Encounter (HOSPITAL_COMMUNITY): Payer: Self-pay | Admitting: Pharmacy Technician

## 2013-01-12 ENCOUNTER — Encounter (HOSPITAL_COMMUNITY)
Admission: RE | Admit: 2013-01-12 | Discharge: 2013-01-12 | Disposition: A | Discharge status: Home / Self Care | Payer: Medicare Other | Source: Ambulatory Visit | Attending: General Surgery | Admitting: General Surgery

## 2013-01-12 ENCOUNTER — Encounter (HOSPITAL_COMMUNITY)
Admission: RE | Admit: 2013-01-12 | Discharge: 2013-01-12 | Disposition: A | Discharge status: Home / Self Care | Payer: Medicare Other | Source: Ambulatory Visit | Attending: Anesthesiology | Admitting: Anesthesiology

## 2013-01-12 ENCOUNTER — Other Ambulatory Visit (INDEPENDENT_AMBULATORY_CARE_PROVIDER_SITE_OTHER): Payer: Self-pay

## 2013-01-12 ENCOUNTER — Encounter (HOSPITAL_COMMUNITY): Payer: Self-pay

## 2013-01-12 DIAGNOSIS — Z01818 Encounter for other preprocedural examination: Secondary | ICD-10-CM | POA: Insufficient documentation

## 2013-01-12 DIAGNOSIS — Z01812 Encounter for preprocedural laboratory examination: Secondary | ICD-10-CM | POA: Diagnosis not present

## 2013-01-12 DIAGNOSIS — C50911 Malignant neoplasm of unspecified site of right female breast: Secondary | ICD-10-CM

## 2013-01-12 HISTORY — DX: Fracture of unspecified metatarsal bone(s), unspecified foot, initial encounter for closed fracture: S92.309A

## 2013-01-12 HISTORY — DX: Adverse effect of unspecified anesthetic, initial encounter: T41.45XA

## 2013-01-12 HISTORY — DX: Other complications of anesthesia, initial encounter: T88.59XA

## 2013-01-12 HISTORY — DX: Other hammer toe(s) (acquired), unspecified foot: M20.40

## 2013-01-12 HISTORY — DX: Malignant neoplasm of unspecified site of right female breast: C50.911

## 2013-01-12 HISTORY — DX: Malignant melanoma of skin, unspecified: C43.9

## 2013-01-12 NOTE — Pre-Procedure Instructions (Signed)
[name]  01/12/2013   Your procedure is scheduled on:  Tuesday, September 16  Report to [facility] at 0800 AM.  Call this number if you have problems the morning of surgery: [phone]   Remember:   Do not eat food or drink liquids after midnight.Monday night   Take these medicines the morning of surgery with A SIP OF WATER: Zantac, Allegra,   Do not wear jewelry, make-up or nail polish.  Do not wear lotions, powders, or perfumes. Do not wear deodorant.  Do not shave 48 hours prior to surgery. Men Brine shave face and neck.  Do not bring valuables to the hospital.  [facility] is not responsible  for any belongings or valuables.  Contacts, dentures or bridgework Lick not be worn into surgery.   Leave suitcase in the car. After surgery it [place] be brought to your room.  For patients admitted to the hospital, checkout time is 11:00 AM the day of  discharge.   Patients discharged the day of surgery will not be allowed to drive  home.  Name and phone number of your driver:    Special Instructions: Shower using CHG 2 nights before surgery and the night before surgery.  If you shower the day of surgery use CHG.  Use special wash - you have one bottle of CHG for all showers.  You should use approximately 1/3 of the bottle for each shower.   Please read over the following fact sheets that you were given: Pain Booklet, Coughing and Deep Breathing and Surgical Site Infection Prevention

## 2013-01-16 MED ORDER — CIPROFLOXACIN IN D5W 400 MG/200ML IV SOLN
400.0000 mg | INTRAVENOUS | Status: AC
  Administered 2013-01-17: 400 mg via INTRAVENOUS
  Filled 2013-01-16: qty 200

## 2013-01-17 ENCOUNTER — Ambulatory Visit
Admission: RE | Admit: 2013-01-17 | Discharge: 2013-01-17 | Disposition: A | Discharge status: Home / Self Care | Payer: Medicare Other | Source: Ambulatory Visit | Attending: General Surgery | Admitting: General Surgery

## 2013-01-17 ENCOUNTER — Encounter (HOSPITAL_COMMUNITY): Payer: Self-pay | Admitting: Certified Registered"

## 2013-01-17 ENCOUNTER — Encounter (HOSPITAL_COMMUNITY)
Admission: RE | Disposition: A | Discharge status: Home / Self Care | Payer: Self-pay | Source: Ambulatory Visit | Attending: General Surgery

## 2013-01-17 ENCOUNTER — Ambulatory Visit (HOSPITAL_COMMUNITY)
Admission: RE | Admit: 2013-01-17 | Discharge: 2013-01-17 | Disposition: A | Discharge status: Home / Self Care | Payer: Medicare Other | Source: Ambulatory Visit | Attending: General Surgery | Admitting: General Surgery

## 2013-01-17 ENCOUNTER — Ambulatory Visit (HOSPITAL_COMMUNITY): Payer: Medicare Other | Admitting: Certified Registered"

## 2013-01-17 ENCOUNTER — Encounter (HOSPITAL_COMMUNITY): Payer: Self-pay | Admitting: *Deleted

## 2013-01-17 DIAGNOSIS — Z79899 Other long term (current) drug therapy: Secondary | ICD-10-CM | POA: Diagnosis not present

## 2013-01-17 DIAGNOSIS — E119 Type 2 diabetes mellitus without complications: Secondary | ICD-10-CM | POA: Diagnosis not present

## 2013-01-17 DIAGNOSIS — D0511 Intraductal carcinoma in situ of right breast: Secondary | ICD-10-CM

## 2013-01-17 DIAGNOSIS — I10 Essential (primary) hypertension: Secondary | ICD-10-CM | POA: Diagnosis not present

## 2013-01-17 DIAGNOSIS — D249 Benign neoplasm of unspecified breast: Secondary | ICD-10-CM | POA: Insufficient documentation

## 2013-01-17 DIAGNOSIS — K219 Gastro-esophageal reflux disease without esophagitis: Secondary | ICD-10-CM | POA: Diagnosis not present

## 2013-01-17 DIAGNOSIS — C50111 Malignant neoplasm of central portion of right female breast: Secondary | ICD-10-CM

## 2013-01-17 DIAGNOSIS — D649 Anemia, unspecified: Secondary | ICD-10-CM | POA: Diagnosis not present

## 2013-01-17 DIAGNOSIS — D0590 Unspecified type of carcinoma in situ of unspecified breast: Secondary | ICD-10-CM | POA: Insufficient documentation

## 2013-01-17 HISTORY — PX: BREAST LUMPECTOMY WITH NEEDLE LOCALIZATION: SHX5759

## 2013-01-17 LAB — GLUCOSE, CAPILLARY: Glucose-Capillary: 175 mg/dL — ABNORMAL HIGH (ref 70–99)

## 2013-01-17 SURGERY — BREAST LUMPECTOMY WITH NEEDLE LOCALIZATION
Anesthesia: General | Site: Breast | Laterality: Right | Wound class: Clean

## 2013-01-17 MED ORDER — LIDOCAINE HCL (CARDIAC) 20 MG/ML IV SOLN
INTRAVENOUS | Status: DC | PRN
  Administered 2013-01-17: 50 mg via INTRAVENOUS

## 2013-01-17 MED ORDER — HYDROMORPHONE HCL PF 1 MG/ML IJ SOLN: 0.2500 mg | INTRAMUSCULAR | Status: DC | PRN

## 2013-01-17 MED ORDER — BUPIVACAINE-EPINEPHRINE 0.5% -1:200000 IJ SOLN
INTRAMUSCULAR | Status: DC | PRN
  Administered 2013-01-17: 10 mL

## 2013-01-17 MED ORDER — LACTATED RINGERS IV SOLN: INTRAVENOUS | Status: DC

## 2013-01-17 MED ORDER — FENTANYL CITRATE 0.05 MG/ML IJ SOLN: 50.0000 ug | INTRAMUSCULAR | Status: DC | PRN

## 2013-01-17 MED ORDER — BUPIVACAINE-EPINEPHRINE (PF) 0.5% -1:200000 IJ SOLN
INTRAMUSCULAR | Status: AC
  Filled 2013-01-17: qty 10

## 2013-01-17 MED ORDER — ACETAMINOPHEN 325 MG PO TABS
ORAL_TABLET | ORAL | Status: AC
  Administered 2013-01-17: 650 mg
  Filled 2013-01-17: qty 2

## 2013-01-17 MED ORDER — SUFENTANIL CITRATE 50 MCG/ML IV SOLN
INTRAVENOUS | Status: DC | PRN
  Administered 2013-01-17: 10 ug via INTRAVENOUS

## 2013-01-17 MED ORDER — CHLORHEXIDINE GLUCONATE 4 % EX LIQD: 1.0000 "application " | Freq: Once | CUTANEOUS | Status: DC

## 2013-01-17 MED ORDER — 0.9 % SODIUM CHLORIDE (POUR BTL) OPTIME
TOPICAL | Status: DC | PRN
  Administered 2013-01-17: 1000 mL

## 2013-01-17 MED ORDER — LACTATED RINGERS IV SOLN
INTRAVENOUS | Status: DC | PRN
  Administered 2013-01-17: 10:00:00 via INTRAVENOUS

## 2013-01-17 MED ORDER — TRAMADOL HCL 50 MG PO TABS: 50.0000 mg | ORAL_TABLET | Freq: Four times a day (QID) | ORAL | Status: DC | PRN

## 2013-01-17 MED ORDER — PROPOFOL 10 MG/ML IV BOLUS
INTRAVENOUS | Status: DC | PRN
  Administered 2013-01-17: 110 mg via INTRAVENOUS

## 2013-01-17 MED ORDER — ACETAMINOPHEN 325 MG PO TABS: 650.0000 mg | ORAL_TABLET | Freq: Once | ORAL | Status: DC

## 2013-01-17 MED ORDER — MIDAZOLAM HCL 5 MG/5ML IJ SOLN
INTRAMUSCULAR | Status: DC | PRN
  Administered 2013-01-17: 1 mg via INTRAVENOUS

## 2013-01-17 MED ORDER — ONDANSETRON HCL 4 MG/2ML IJ SOLN
INTRAMUSCULAR | Status: DC | PRN
  Administered 2013-01-17: 4 mg via INTRAVENOUS

## 2013-01-17 SURGICAL SUPPLY — 48 items
ADH SKN CLS APL DERMABOND .7 (GAUZE/BANDAGES/DRESSINGS) ×1
BLADE SURG 10 STRL SS (BLADE) ×2 IMPLANT
BLADE SURG 15 STRL LF DISP TIS (BLADE) ×1 IMPLANT
BLADE SURG 15 STRL SS (BLADE) ×2
CANISTER SUCTION 2500CC (MISCELLANEOUS) ×1 IMPLANT
CHLORAPREP W/TINT 26ML (MISCELLANEOUS) ×2 IMPLANT
CLIP TI MEDIUM 6 (CLIP) ×3 IMPLANT
CLOTH BEACON ORANGE TIMEOUT ST (SAFETY) ×2 IMPLANT
CONT SPEC 4OZ CLIKSEAL STRL BL (MISCELLANEOUS) ×1 IMPLANT
COVER SURGICAL LIGHT HANDLE (MISCELLANEOUS) ×2 IMPLANT
DERMABOND ADVANCED (GAUZE/BANDAGES/DRESSINGS) ×1
DERMABOND ADVANCED .7 DNX12 (GAUZE/BANDAGES/DRESSINGS) ×1 IMPLANT
DEVICE DUBIN SPECIMEN MAMMOGRA (MISCELLANEOUS) ×2 IMPLANT
DRAPE CHEST BREAST 15X10 FENES (DRAPES) ×2 IMPLANT
DRAPE UTILITY 15X26 W/TAPE STR (DRAPE) ×4 IMPLANT
ELECT COATED BLADE 2.86 ST (ELECTRODE) ×2 IMPLANT
ELECT REM PT RETURN 9FT ADLT (ELECTROSURGICAL) ×2
ELECTRODE REM PT RTRN 9FT ADLT (ELECTROSURGICAL) ×1 IMPLANT
GLOVE BIO SURGEON STRL SZ7.5 (GLOVE) ×1 IMPLANT
GLOVE BIOGEL PI IND STRL 7.0 (GLOVE) IMPLANT
GLOVE BIOGEL PI IND STRL 7.5 (GLOVE) IMPLANT
GLOVE BIOGEL PI IND STRL 8 (GLOVE) ×1 IMPLANT
GLOVE BIOGEL PI INDICATOR 7.0 (GLOVE) ×1
GLOVE BIOGEL PI INDICATOR 7.5 (GLOVE) ×1
GLOVE BIOGEL PI INDICATOR 8 (GLOVE) ×1
GLOVE SS BIOGEL STRL SZ 7.5 (GLOVE) ×2 IMPLANT
GLOVE SUPERSENSE BIOGEL SZ 7.5 (GLOVE) ×2
GLOVE SURG SS PI 7.0 STRL IVOR (GLOVE) ×1 IMPLANT
GOWN STRL NON-REIN LRG LVL3 (GOWN DISPOSABLE) ×3 IMPLANT
GOWN STRL REIN XL XLG (GOWN DISPOSABLE) ×2 IMPLANT
KIT BASIN OR (CUSTOM PROCEDURE TRAY) ×2 IMPLANT
KIT MARKER MARGIN INK (KITS) ×2 IMPLANT
KIT ROOM TURNOVER OR (KITS) ×2 IMPLANT
NDL HYPO 25X1 1.5 SAFETY (NEEDLE) ×1 IMPLANT
NEEDLE HYPO 25X1 1.5 SAFETY (NEEDLE) ×2 IMPLANT
NS IRRIG 1000ML POUR BTL (IV SOLUTION) ×2 IMPLANT
PACK SURGICAL SETUP 50X90 (CUSTOM PROCEDURE TRAY) ×2 IMPLANT
PAD ARMBOARD 7.5X6 YLW CONV (MISCELLANEOUS) ×2 IMPLANT
PENCIL BUTTON HOLSTER BLD 10FT (ELECTRODE) ×2 IMPLANT
SPONGE LAP 18X18 X RAY DECT (DISPOSABLE) ×2 IMPLANT
SUT MON AB 5-0 PS2 18 (SUTURE) ×2 IMPLANT
SUT VIC AB 3-0 SH 18 (SUTURE) ×2 IMPLANT
SYR BULB 3OZ (MISCELLANEOUS) ×2 IMPLANT
SYR CONTROL 10ML LL (SYRINGE) ×2 IMPLANT
TOWEL OR 17X24 6PK STRL BLUE (TOWEL DISPOSABLE) ×2 IMPLANT
TOWEL OR 17X26 10 PK STRL BLUE (TOWEL DISPOSABLE) ×2 IMPLANT
TUBE CONNECTING 12X1/4 (SUCTIONS) ×1 IMPLANT
YANKAUER SUCT BULB TIP NO VENT (SUCTIONS) ×1 IMPLANT

## 2013-01-17 NOTE — Anesthesia Preprocedure Evaluation (Addendum)
Anesthesia Evaluation  Patient identified by MRN, date of birth, ID band Patient awake    Reviewed: Allergy & Precautions, H&P , NPO status , Patient's Chart, lab work & pertinent test results  Airway Mallampati: II TM Distance: >3 FB Neck ROM: full    Dental no notable dental hx. (+) Teeth Intact and Dental Advisory Given   Pulmonary asthma ,  breath sounds clear to auscultation  Pulmonary exam normal       Cardiovascular Exercise Tolerance: Good hypertension, Pt. on medications Rhythm:regular Rate:Normal     Neuro/Psych negative neurological ROS  negative psych ROS   GI/Hepatic negative GI ROS, Neg liver ROS,   Endo/Other  diabetes, Well Controlled, Type 2, Oral Hypoglycemic Agents  Renal/GU negative Renal ROS  negative genitourinary   Musculoskeletal   Abdominal   Peds  Hematology negative hematology ROS (+)   Anesthesia Other Findings   Reproductive/Obstetrics negative OB ROS                          Anesthesia Physical Anesthesia Plan  ASA: III  Anesthesia Plan: General   Post-op Pain Management:    Induction: Intravenous  Airway Management Planned: LMA  Additional Equipment:   Intra-op Plan:   Post-operative Plan:   Informed Consent: I have reviewed the patients History and Physical, chart, labs and discussed the procedure including the risks, benefits and alternatives for the proposed anesthesia with the patient or authorized representative who has indicated his/her understanding and acceptance.   Dental Advisory Given  Plan Discussed with: CRNA and Surgeon  Anesthesia Plan Comments:        Anesthesia Quick Evaluation

## 2013-01-17 NOTE — Op Note (Signed)
Preoperative Diagnosis: DCIS right breast   Postoprative Diagnosis: DCIS right breast   Procedure: Procedure(s): BREAST LUMPECTOMY WITH NEEDLE LOCALIZATION   Surgeon: Glenna Fellows T   Assistants: None  Anesthesia:  General LMA anesthesia  Indications: Patient is an 79 year old female with a recent abnormal mammogram showing clustered microcalcifications just above and deep to the right nipple. Large core needle biopsy has revealed grade 3 ductal carcinoma in situ. After discussion of options and risks detailed elsewhere we elected to proceed with needle localized right breast lumpectomy.    Procedure Detail:  Following accurate needle localization at the breast center the patient brought to operating room, placed supine position on operating table and laryngeal mask general anesthesia induced. She received preoperative IV antibiotics. The right breast was widely sterilely prepped and draped. PAS were in place. Patient time out was performed and correct procedure verified. I made a curvilinear incision along the superior and lateral areolar border dissection was carried down into the subcutaneous tissue. The laterally placed wire was brought into the incision. Then working medially by excised a generous core of breast tissue around the shaft and the tip of the wire in an effort to obtain negative margins. The specimen mammography showed the marking clip and the calcifications contained within the specimen. The inferior edge although appearing clear fluid somewhat closer than the others and then I went back and took a several millimeter additional inferior margin which was marked and sent for permanent pathology. The soft tissue was infiltrated with Marcaine. The cavity was marked with clips. The breast the saphenous tissue was closed with interrupted 3-0 Vicryl the skin closed with subcutaneous 4-0 Monocryl and Dermabond. Sponge needle and instrument counts were correct.    Estimated Blood  Loss:  Minimal         Drains: none  Blood Given: none          Specimens: #1 right breast lumpectomy     #2 further inferior margin        Complications:  * No complications entered in OR log *         Disposition: PACU - hemodynamically stable.         Condition: stable

## 2013-01-17 NOTE — Transfer of Care (Signed)
Immediate Anesthesia Transfer of Care Note  Patient: [name]  Procedure(s) Performed: Procedure(s): BREAST LUMPECTOMY WITH NEEDLE LOCALIZATION (Right)  Patient Location: PACU  Anesthesia Type:General  Level of Consciousness: sedated  Airway & Oxygen Therapy: Patient Spontanous Breathing and Patient connected to nasal cannula oxygen  Post-op Assessment: Report given to PACU RN, Post -op Vital signs reviewed and stable and Patient moving all extremities  Post vital signs: Reviewed and stable  Complications: No apparent anesthesia complications

## 2013-01-17 NOTE — Interval H&P Note (Signed)
History and Physical Interval Note:  01/17/2013 9:59 AM  [name]  has presented today for surgery, with the diagnosis of DCIS right breast   The various methods of treatment have been discussed with the patient and family. After consideration of risks, benefits and other options for treatment, the patient has consented to  Procedure(s) with comments: BREAST LUMPECTOMY WITH NEEDLE LOCALIZATION (Right) - needle local 7:30 BCG  as a surgical intervention .  The patient's history has been reviewed, patient examined, no change in status, stable for surgery.  I have reviewed the patient's chart and labs.  Questions were answered to the patient's satisfaction.     Marko Skalski T

## 2013-01-17 NOTE — Anesthesia Postprocedure Evaluation (Signed)
  Anesthesia Post-op Note  Patient: [name]  Procedure(s) Performed: Procedure(s) (LRB): BREAST LUMPECTOMY WITH NEEDLE LOCALIZATION (Right)  Patient Location: PACU  Anesthesia Type: General  Level of Consciousness: awake and alert   Airway and Oxygen Therapy: Patient Spontanous Breathing  Post-op Pain: mild  Post-op Assessment: Post-op Vital signs reviewed, Patient's Cardiovascular Status Stable, Respiratory Function Stable, Patent Airway and No signs of Nausea or vomiting  Last Vitals:  Filed Vitals:   01/17/13 1152  BP: 140/78  Pulse: 64  Temp:   Resp: 15    Post-op Vital Signs: stable   Complications: No apparent anesthesia complications

## 2013-01-17 NOTE — H&P (View-Only) (Signed)
Chief Complaint: New diagnosis of breast cancer  History:    [name] is a 79 y.o. postmenopausal female referred by Dr. Dierdre Searles  for evaluation of recently diagnosed carcinoma of the right breast. She recently presented for a screening mamogram revealing calcifications at the 12 o'clock position.  Subsequent imaging included diagnostic mamogram showing Suspicious pleomorphic calcifications at the 12:00 position in the right breast measuring 1.1 cm.   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 She is seen now in [facility] for initial treatment planning.  She has experienced no breast lump,pain or discharge.  She does not have a personal history of any previous breast problems.  Findings at that time were the following:  Tumor size: 1.7 cm  Tumor grade: 3  Estrogen Receptor: negative Progesterone Receptor: negative  Her-2 neu: not obtained  Lymph node status: negative Neurovascular invasion: no Lymphatic invasion: no  Past Medical History  Diagnosis Date  . Seasonal allergies   . Diabetes mellitus   . HTN (hypertension)   . Osteoarthritis   . Palpitations   . Anemia   . History of colonic polyps   . GERD (gastroesophageal reflux disease)   . Hyperlipidemia   . Chronic facial pain   . Vitamin B12 deficiency   . Dyslipidemia   . Chronic foot pain   . Chronic cough   . Obesity     Past Surgical History  Procedure Laterality Date  . Colonoscopy    . Polypectomy      small adenomatous    Current Outpatient Prescriptions  Medication Sig Dispense Refill  . ALPRAZolam (XANAX) 0.25 MG tablet Take 1 tablet (0.25 mg total) by mouth at bedtime as needed.  30 tablet  5  . Azelastine HCl (ASTEPRO) 0.15 % SOLN 1 spray by Nasal route 2 (two) times daily.        . beclomethasone (QVAR) 80 MCG/ACT inhaler Inhale 2 puffs into the lungs 2 (two) times daily.        . benzonatate (TESSALON) 100 MG capsule Take 1 capsule (100  mg total) by mouth 3 (three) times daily as needed.  270 capsule  1  . calcium carbonate (OS-CAL) 600 MG TABS Take 600 mg by mouth 2 (two) times daily with a meal.        . cholecalciferol (VITAMIN D) 1000 UNITS tablet Take 1,000 Units by mouth daily at 6 (six) AM.       . cyanocobalamin (,VITAMIN B-12,) 1000 MCG/ML injection INJECT 1 ML INTO THE MUSCLE EVERY 21 DAYS  10 mL  2  . Diclofenac Sodium 3 % GEL Place 1 application onto the skin 2 (two) times daily.  300 g  3  . ferrous sulfate 325 (65 FE) MG tablet Take by mouth.       . fexofenadine (ALLEGRA) 60 MG tablet Take 60 mg by mouth 2 (two) times daily.        . fish oil-omega-3 fatty acids 1000 MG capsule Take 1 g by mouth 2 (two) times daily.       . furosemide (LASIX) 20 MG tablet Take 20 mg by mouth. 1 qd prn weakness      . hydrOXYzine (ATARAX) 25 MG tablet Take 25 mg by mouth 3 (three) times daily as needed.        Marland Kitchen MICRONIZED COLESTIPOL HCL 1 G tablet TAKE 2 TABLETS TWICE A DAY  360 tablet  3  . promethazine (  PHENERGAN) 25 MG tablet Take 25 mg by mouth every 6 (six) hours as needed.        . ranitidine (ZANTAC) 150 MG capsule Take 150 mg by mouth 2 (two) times daily.        . sitaGLIPtan-metformin (JANUMET) 50-1000 MG per tablet TAKE 1 TABLET BY MOUTH TWO TIMES DAILY WITH A MEAL  180 tablet  3  . STARLIX 120 MG tablet TAKE 1 TABLET THREE TIMES A DAY BEFORE MEALS  270 tablet  2  . trandolapril-verapamil (TARKA) 2-180 MG per tablet Take 1 tablet by mouth daily.      . verapamil (CALAN) 40 MG tablet Take 1 tablet (40 mg total) by mouth 3 (three) times daily.  270 tablet  3   Current Facility-Administered Medications  Medication Dose Route Frequency Provider Last Rate Last Dose  . cyanocobalamin ((VITAMIN B-12)) injection 1,000 mcg  1,000 mcg Intramuscular Q30 days Stacie Glaze, MD   1,000 mcg at 02/13/11 6213    Family History  Problem Relation Age of Onset  . Colon cancer    . Heart disease Mother   . Diabetes Father      History   Social History  . Marital Status: Widowed    Spouse Name: N/A    Number of Children: N/A  . Years of Education: N/A   Occupational History  . retired    Social History Main Topics  . Smoking status: Never Smoker   . Smokeless tobacco: Not on file  . Alcohol Use: No  . Drug Use: No  . Sexual Activity: Yes   Other Topics Concern  . Not on file   Social History Narrative  . No narrative on file     Review of Systems Constitutional: positive for fatigue    Cardiac: No chest pain or palpitations. History of "silent heart attack" years ago with negative stress test Lungs: Occasional cough. No shortness of breath Abdomen: Some chronic constipation. Colonoscopy up to date Skin: History of melanoma chest wall Musculoskeletal: Some chronic back and joint pain and arthritis and difficulty with mobility Psychiatric: Positive for anxiety Objective:    General: Alert, well-developed elderly Caucasian  female, in no distress Skin: Warm and dry without rash or infection. HEENT: No palpable masses or thyromegaly. Sclera nonicteric. Pupils equal round and reactive. Oropharynx clear. Breasts: Slight bruising just above the nipple on the right. No palpable masses in either breast. No palpable axillary adenopathy. Lymph nodes: No cervical, supraclavicular, or inguinal nodes palpable. Lungs: Breath sounds clear and equal without increased work of breathing Cardiovascular: Regular rate and rhythm without murmur. No JVD or edema. Peripheral pulses intact. Abdomen: Nondistended. Soft and nontender. No masses palpable. No organomegaly. No palpable hernias. Extremities: No edema or joint swelling or deformity. No chronic venous stasis changes. Neurologic: Alert and fully oriented. Gait normal.   Laboratory data:  CBC:  Lab Results  Component Value Date   WBC 8.8 01/04/2013   WBC 8.1 10/03/2012   RBC 4.22 01/04/2013   RBC 4.19 10/03/2012   HGB 12.7 01/04/2013   HGB 12.6 10/03/2012    HCT 37.7 01/04/2013   HCT 37.0 10/03/2012   PLT 300 01/04/2013   PLT 313.0 10/03/2012  ]  [facility] Labs:  Lab Results  Component Value Date   NA 138 01/04/2013   NA 134* 10/03/2012   K 3.9 01/04/2013   K 3.7 10/03/2012   CL 100 10/03/2012   CO2 22 01/04/2013   CO2 22 10/03/2012  BUN 10.3 01/04/2013   BUN 10 10/03/2012   CREATININE 1.0 01/04/2013   CREATININE 0.8 10/03/2012   CALCIUM 9.6 01/04/2013   CALCIUM 9.3 10/03/2012   PROT 6.4 01/04/2013   PROT 6.6 06/10/2012   BILITOT 1.01 01/04/2013   BILITOT 0.7 06/10/2012   BILIDIR 0.1 06/10/2012   ALKPHOS 52 01/04/2013   ALKPHOS 44 06/10/2012   AST 14 01/04/2013   AST 17 06/10/2012   ALT 16 01/04/2013   ALT 21 06/10/2012     Assessment  79 y.o. female with a new diagnosis of cancer of the the right breast 12:00 position.  Clinical 0, estrogen receptor negative. I discussed with the patient and family members present today initial surgical treatment options. We discussed options of breast conservation with lumpectomy or total mastectomy. Options for reconstruction were discussed. After discussion they have elected to proceed with right partial mastectomy.  We discussed the indications and nature of the procedure, and expected recovery, in detail. Surgical risks including anesthetic complications, cardiorespiratory complications, bleeding, infection, wound healing complications, blood clots, lymphedema, local and distant recurrence and possible need for further surgery based on the final pathology was discussed and understood.  Chemotherapy, hormonal therapy and radiation therapy have been discussed. They have been provided with literature regarding the treatment of breast cancer.  All questions were answered. They understand and agree to proceed and we will go ahead with scheduling.  Plan right partial mastectomy, needle localized, under general anesthesia as an outpatient  Mariella Saa MD, FACS  01/04/2013, 1:29 PM

## 2013-01-17 NOTE — Preoperative (Signed)
Beta Blockers   Reason not to administer Beta Blockers:Not Applicable 

## 2013-01-18 ENCOUNTER — Telehealth (INDEPENDENT_AMBULATORY_CARE_PROVIDER_SITE_OTHER): Payer: Self-pay | Admitting: General Surgery

## 2013-01-18 NOTE — Telephone Encounter (Signed)
Tried to call patient with path report but no answer.

## 2013-01-19 ENCOUNTER — Encounter (HOSPITAL_COMMUNITY): Payer: Self-pay | Admitting: General Surgery

## 2013-01-20 ENCOUNTER — Encounter (HOSPITAL_COMMUNITY): Payer: Self-pay | Admitting: *Deleted

## 2013-01-20 ENCOUNTER — Telehealth (INDEPENDENT_AMBULATORY_CARE_PROVIDER_SITE_OTHER): Payer: Self-pay

## 2013-01-20 NOTE — Telephone Encounter (Signed)
Called and left message for patient to call our office.  I will discuss patient's pathology report per Dr. Johna Sheriff instructions.  Please page me when patient calls.

## 2013-01-20 NOTE — Telephone Encounter (Signed)
Spoke to patient regarding her pathology results.  Patient understood that we need to re-excise the breast tissue to get a clear margin.  Spoke to [place] in scheduling and handwritten orders have been given to schedule patient.  Patient scheduled for Re-Excision of Right Breast on 01/24/13 @ 11:30 am @ [facility].  Message has been routed to Dr. Johna Sheriff to enter orders in [facility].  Patient agrees with plan as above.

## 2013-01-20 NOTE — Progress Notes (Signed)
SPOKE WITH PT BY PHONE TO REVIEW PREOP INSTRUCTIONS AND CHLORHEXIDINE INSTRUCTIONS / PRECAUTIONS.

## 2013-01-20 NOTE — Progress Notes (Signed)
Dr. Johna Sheriff - Please enter preop orders in Epic for [facility] - surgery is Tuesday  9/23.   Thanks!

## 2013-01-23 ENCOUNTER — Other Ambulatory Visit: Payer: Self-pay | Admitting: Internal Medicine

## 2013-01-23 ENCOUNTER — Other Ambulatory Visit (INDEPENDENT_AMBULATORY_CARE_PROVIDER_SITE_OTHER): Payer: Self-pay | Admitting: General Surgery

## 2013-01-23 ENCOUNTER — Telehealth: Payer: Self-pay | Admitting: *Deleted

## 2013-01-23 NOTE — Telephone Encounter (Signed)
CALLED PATIENT TO [facility] [place] VISIT FOR THIS PATIENT, SPOKE WITH PATIENT AND THIS APPT. HAS BEEN MOVED TO 02-21-13, PATIENT AGREED TO NEW TIME AND DATE.

## 2013-01-24 ENCOUNTER — Encounter (HOSPITAL_COMMUNITY)
Admission: RE | Disposition: A | Discharge status: Home / Self Care | Payer: Self-pay | Source: Ambulatory Visit | Attending: General Surgery

## 2013-01-24 ENCOUNTER — Encounter (HOSPITAL_COMMUNITY): Payer: Self-pay | Admitting: Certified Registered Nurse Anesthetist

## 2013-01-24 ENCOUNTER — Encounter (HOSPITAL_COMMUNITY): Payer: Self-pay | Admitting: *Deleted

## 2013-01-24 ENCOUNTER — Ambulatory Visit (HOSPITAL_COMMUNITY)
Admission: RE | Admit: 2013-01-24 | Discharge: 2013-01-24 | Disposition: A | Discharge status: Home / Self Care | Payer: Medicare Other | Source: Ambulatory Visit | Attending: General Surgery | Admitting: General Surgery

## 2013-01-24 ENCOUNTER — Ambulatory Visit (HOSPITAL_COMMUNITY): Payer: Medicare Other | Admitting: Certified Registered Nurse Anesthetist

## 2013-01-24 DIAGNOSIS — C50111 Malignant neoplasm of central portion of right female breast: Secondary | ICD-10-CM

## 2013-01-24 DIAGNOSIS — J45909 Unspecified asthma, uncomplicated: Secondary | ICD-10-CM | POA: Insufficient documentation

## 2013-01-24 DIAGNOSIS — D249 Benign neoplasm of unspecified breast: Secondary | ICD-10-CM | POA: Diagnosis not present

## 2013-01-24 DIAGNOSIS — E119 Type 2 diabetes mellitus without complications: Secondary | ICD-10-CM | POA: Diagnosis not present

## 2013-01-24 DIAGNOSIS — K219 Gastro-esophageal reflux disease without esophagitis: Secondary | ICD-10-CM | POA: Diagnosis not present

## 2013-01-24 DIAGNOSIS — C50919 Malignant neoplasm of unspecified site of unspecified female breast: Secondary | ICD-10-CM | POA: Insufficient documentation

## 2013-01-24 DIAGNOSIS — I10 Essential (primary) hypertension: Secondary | ICD-10-CM | POA: Diagnosis not present

## 2013-01-24 DIAGNOSIS — D0590 Unspecified type of carcinoma in situ of unspecified breast: Secondary | ICD-10-CM

## 2013-01-24 HISTORY — PX: BREAST BIOPSY: SHX20

## 2013-01-24 LAB — GLUCOSE, CAPILLARY
Glucose-Capillary: 144 mg/dL — ABNORMAL HIGH (ref 70–99)
Glucose-Capillary: 206 mg/dL — ABNORMAL HIGH (ref 70–99)

## 2013-01-24 SURGERY — BREAST BIOPSY
Anesthesia: General | Site: Breast | Laterality: Right | Wound class: Clean

## 2013-01-24 MED ORDER — ONDANSETRON HCL 4 MG/2ML IJ SOLN
INTRAMUSCULAR | Status: DC | PRN
  Administered 2013-01-24: 4 mg via INTRAVENOUS

## 2013-01-24 MED ORDER — CIPROFLOXACIN IN D5W 400 MG/200ML IV SOLN
400.0000 mg | INTRAVENOUS | Status: AC
  Administered 2013-01-24: 400 mg via INTRAVENOUS

## 2013-01-24 MED ORDER — MEPERIDINE HCL 50 MG/ML IJ SOLN: 6.2500 mg | INTRAMUSCULAR | Status: DC | PRN

## 2013-01-24 MED ORDER — BUPIVACAINE-EPINEPHRINE 0.5% -1:200000 IJ SOLN
INTRAMUSCULAR | Status: DC | PRN
  Administered 2013-01-24: 30 mL

## 2013-01-24 MED ORDER — EPHEDRINE SULFATE 50 MG/ML IJ SOLN
INTRAMUSCULAR | Status: DC | PRN
  Administered 2013-01-24 (×2): 5 mg via INTRAVENOUS

## 2013-01-24 MED ORDER — CHLORHEXIDINE GLUCONATE 4 % EX LIQD
1.0000 "application " | Freq: Once | CUTANEOUS | Status: DC
  Filled 2013-01-24: qty 15

## 2013-01-24 MED ORDER — BUPIVACAINE HCL 0.5 % IJ SOLN
INTRAMUSCULAR | Status: DC | PRN
  Administered 2013-01-24: 20 mL

## 2013-01-24 MED ORDER — CIPROFLOXACIN IN D5W 400 MG/200ML IV SOLN
INTRAVENOUS | Status: AC
  Filled 2013-01-24: qty 200

## 2013-01-24 MED ORDER — PROMETHAZINE HCL 25 MG/ML IJ SOLN: 6.2500 mg | INTRAMUSCULAR | Status: DC | PRN

## 2013-01-24 MED ORDER — PROPOFOL 10 MG/ML IV BOLUS
INTRAVENOUS | Status: DC | PRN
  Administered 2013-01-24: 160 mg via INTRAVENOUS

## 2013-01-24 MED ORDER — FENTANYL CITRATE 0.05 MG/ML IJ SOLN
25.0000 ug | INTRAMUSCULAR | Status: DC | PRN
  Administered 2013-01-24: 50 ug via INTRAVENOUS

## 2013-01-24 MED ORDER — LACTATED RINGERS IV SOLN
INTRAVENOUS | Status: DC
  Administered 2013-01-24: 12:00:00 via INTRAVENOUS
  Administered 2013-01-24: 1000 mL via INTRAVENOUS

## 2013-01-24 MED ORDER — FENTANYL CITRATE 0.05 MG/ML IJ SOLN
INTRAMUSCULAR | Status: AC
  Filled 2013-01-24: qty 2

## 2013-01-24 MED ORDER — BUPIVACAINE HCL (PF) 0.5 % IJ SOLN
INTRAMUSCULAR | Status: AC
  Filled 2013-01-24: qty 30

## 2013-01-24 MED ORDER — FENTANYL CITRATE 0.05 MG/ML IJ SOLN
INTRAMUSCULAR | Status: DC | PRN
  Administered 2013-01-24 (×2): 50 ug via INTRAVENOUS

## 2013-01-24 MED ORDER — LIDOCAINE HCL (CARDIAC) 20 MG/ML IV SOLN
INTRAVENOUS | Status: DC | PRN
  Administered 2013-01-24: 50 mg via INTRAVENOUS

## 2013-01-24 MED ORDER — 0.9 % SODIUM CHLORIDE (POUR BTL) OPTIME
TOPICAL | Status: DC | PRN
  Administered 2013-01-24: 1000 mL

## 2013-01-24 MED ORDER — BUPIVACAINE-EPINEPHRINE 0.5% -1:200000 IJ SOLN
INTRAMUSCULAR | Status: AC
  Filled 2013-01-24: qty 1

## 2013-01-24 MED ORDER — MIDAZOLAM HCL 5 MG/5ML IJ SOLN
INTRAMUSCULAR | Status: DC | PRN
  Administered 2013-01-24: 1 mg via INTRAVENOUS

## 2013-01-24 SURGICAL SUPPLY — 32 items
ADH SKN CLS APL DERMABOND .7 (GAUZE/BANDAGES/DRESSINGS) ×1
BLADE HEX COATED 2.75 (ELECTRODE) ×2 IMPLANT
BLADE SURG 15 STRL LF DISP TIS (BLADE) ×2 IMPLANT
BLADE SURG 15 STRL SS (BLADE) ×2
BLADE SURG SZ10 CARB STEEL (BLADE) ×2 IMPLANT
CANISTER SUCTION 2500CC (MISCELLANEOUS) ×2 IMPLANT
CHLORAPREP W/TINT 10.5 ML (MISCELLANEOUS) ×2 IMPLANT
CLOTH BEACON ORANGE TIMEOUT ST (SAFETY) ×1 IMPLANT
DECANTER SPIKE VIAL GLASS SM (MISCELLANEOUS) ×1 IMPLANT
DERMABOND ADVANCED (GAUZE/BANDAGES/DRESSINGS) ×1
DERMABOND ADVANCED .7 DNX12 (GAUZE/BANDAGES/DRESSINGS) IMPLANT
DRAPE LAPAROTOMY TRNSV 102X78 (DRAPE) ×2 IMPLANT
ELECT REM PT RETURN 9FT ADLT (ELECTROSURGICAL) ×2
ELECTRODE REM PT RTRN 9FT ADLT (ELECTROSURGICAL) ×1 IMPLANT
GAUZE SPONGE 4X4 16PLY XRAY LF (GAUZE/BANDAGES/DRESSINGS) ×1 IMPLANT
GOWN PREVENTION PLUS LG XLONG (DISPOSABLE) ×1 IMPLANT
GOWN STRL REIN XL XLG (GOWN DISPOSABLE) ×5 IMPLANT
KIT BASIN OR (CUSTOM PROCEDURE TRAY) ×2 IMPLANT
MARKER SKIN DUAL TIP RULER LAB (MISCELLANEOUS) ×1 IMPLANT
NDL HYPO 25X1 1.5 SAFETY (NEEDLE) ×1 IMPLANT
NEEDLE HYPO 22GX1.5 SAFETY (NEEDLE) ×2 IMPLANT
NEEDLE HYPO 25X1 1.5 SAFETY (NEEDLE) IMPLANT
NS IRRIG 1000ML POUR BTL (IV SOLUTION) ×2 IMPLANT
PACK BASIC VI WITH GOWN DISP (CUSTOM PROCEDURE TRAY) ×2 IMPLANT
PENCIL BUTTON HOLSTER BLD 10FT (ELECTRODE) ×2 IMPLANT
SOL PREP POV-IOD 16OZ 10% (MISCELLANEOUS) ×1 IMPLANT
SPONGE GAUZE 4X4 12PLY (GAUZE/BANDAGES/DRESSINGS) ×1 IMPLANT
SUT VIC AB 3-0 SH 8-18 (SUTURE) ×1 IMPLANT
SYR CONTROL 10ML LL (SYRINGE) ×2 IMPLANT
TOWEL OR 17X26 10 PK STRL BLUE (TOWEL DISPOSABLE) ×2 IMPLANT
WATER STERILE IRR 1500ML POUR (IV SOLUTION) ×1 IMPLANT
YANKAUER SUCT BULB TIP 10FT TU (MISCELLANEOUS) ×2 IMPLANT

## 2013-01-24 NOTE — Preoperative (Signed)
Beta Blockers   Reason not to administer Beta Blockers:Not Applicable 

## 2013-01-24 NOTE — Transfer of Care (Signed)
Immediate Anesthesia Transfer of Care Note  Patient: [name]  Procedure(s) Performed: Procedure(s): RE-EXCICION OF BREAST CANCER, ANTERIOR MARGINS (Right)  Patient Location: PACU  Anesthesia Type:General  Level of Consciousness: awake, oriented, patient cooperative and confused  Airway & Oxygen Therapy: Patient Spontanous Breathing and Patient connected to face mask oxygen  Post-op Assessment: Report given to PACU RN, Post -op Vital signs reviewed and stable and Patient moving all extremities  Post vital signs: Reviewed and stable  Complications: No apparent anesthesia complications

## 2013-01-24 NOTE — H&P (View-Only) (Signed)
Checked in new patient with no financial issues. Mail and phone only for communication and ok to leave message.I didn't ask if living will or poa. She did have her [facility] form

## 2013-01-24 NOTE — Op Note (Signed)
Preoperative Diagnosis: right breast cancer, DCIS  Postoprative Diagnosis: right breast cancer, DCIS  Procedure: Procedure(s): RE-EXCICION OF BREAST CANCER, ANTERIOR MARGINS   Surgeon: Glenna Fellows T   Assistants: None  Anesthesia:  General LMA anesthesia  Indications: Patient is an 79 year old female recently diagnosed with ductal carcinoma in situ in the right breast. Last week she underwent needle localized lumpectomy with findings of a 2.8 cm tumor with preoperative imaging indicating 1.7 cm. The anterior margin was positive with the remainder of the margins negative. I discussed this with the patient including options and we have elected to proceed with reexcision of her anterior margin. We discussed alternatives and the nature of the surgery risks of bleeding, infection, and anesthetic complications and she is in agreement.  Procedure Detail:  Patient was brought to the operating room, placed in the supine position on the operating table, and general endotracheal anesthesia induced. The right breast was widely sterilely prepped and draped. She received preoperative IV antibiotics. The Dermabond dressing had been removed prior to the prep and her incision was healing nicely with no evidence of infection or seroma or other complications. Patient timeout was performed the correct procedure verified. The previous incision was sharply opened and suture material removed. Deep sutures were sharply removed and the entire lumpectomy cavity completely opened and exposed. The anterior margin was Behind the nipple areolar complex. This was already relatively thin but I exposed this area and sharply removed all tissue essentially up to the areolar skin and included the ducts directly behind the nipple. This was about a 5 mm thick specimen and approximately 2-1/2 cm in diameter. This was oriented with a suture and sent for permanent pathology. The wound was thoroughly irrigated. The breast and  subcutaneous tissue were closed with interrupted 3-0 Vicryl the skin was closed with a running subcuticular 4-0 Monocryl and Dermabond. Sponge needle and instrument counts were correct.    Findings: As above  Estimated Blood Loss:  Minimal         Drains: none  Blood Given: none          Specimens: Breast lumpectomy reexcision, further anterior margin, oriented with suture        Complications:  * No complications entered in OR log *         Disposition: PACU - hemodynamically stable.         Condition: stable

## 2013-01-24 NOTE — Anesthesia Postprocedure Evaluation (Signed)
  Anesthesia Post-op Note  Patient: [name]  Procedure(s) Performed: Procedure(s) (LRB): RE-EXCICION OF BREAST CANCER, ANTERIOR MARGINS (Right)  Patient Location: PACU  Anesthesia Type: General  Level of Consciousness: awake and alert   Airway and Oxygen Therapy: Patient Spontanous Breathing  Post-op Pain: mild  Post-op Assessment: Post-op Vital signs reviewed, Patient's Cardiovascular Status Stable, Respiratory Function Stable, Patent Airway and No signs of Nausea or vomiting  Last Vitals:  Filed Vitals:   01/24/13 1325  BP: 129/71  Pulse: 68  Temp: 36.1 C  Resp: 16    Post-op Vital Signs: stable   Complications: No apparent anesthesia complications

## 2013-01-24 NOTE — Interval H&P Note (Signed)
History and Physical Interval Note:  01/24/2013 10:38 AM  [name]  has presented today for surgery, with the diagnosis of right breast cancer.  Recent lumpectomy for DCIS has shown a positive anterior margin.  We have elected to proceed with re excision of her anterior margin.  The various methods of treatment have been discussed with the patient and family. After consideration of risks, benefits and other options for treatment, the patient has consented to  Procedure(s): RE-EXCICION OF BREAST CANCER, ANTERIOR MARGIN (Right) as a surgical intervention .  The patient's history has been reviewed, patient examined, no change in status, stable for surgery.  I have reviewed the patient's chart and labs.  Questions were answered to the patient's satisfaction.     Kodi Steil T

## 2013-01-24 NOTE — Anesthesia Preprocedure Evaluation (Addendum)
Anesthesia Evaluation  Patient identified by MRN, date of birth, ID band Patient awake    Reviewed: Allergy & Precautions, H&P , NPO status , Patient's Chart, lab work & pertinent test results  History of Anesthesia Complications Negative for: history of anesthetic complications  Airway Mallampati: II TM Distance: >3 FB Neck ROM: Full    Dental no notable dental hx.    Pulmonary neg pulmonary ROS, asthma ,  breath sounds clear to auscultation  Pulmonary exam normal       Cardiovascular hypertension, Pt. on medications Rhythm:Regular Rate:Normal     Neuro/Psych negative neurological ROS  negative psych ROS   GI/Hepatic negative GI ROS, Neg liver ROS,   Endo/Other  diabetes, Oral Hypoglycemic Agents  Renal/GU negative Renal ROS  negative genitourinary   Musculoskeletal negative musculoskeletal ROS (+)   Abdominal   Peds negative pediatric ROS (+)  Hematology negative hematology ROS (+)   Anesthesia Other Findings   Reproductive/Obstetrics negative OB ROS                          Anesthesia Physical Anesthesia Plan  ASA: II  Anesthesia Plan: General   Post-op Pain Management:    Induction: Intravenous  Airway Management Planned: LMA  Additional Equipment:   Intra-op Plan:   Post-operative Plan:   Informed Consent: I have reviewed the patients History and Physical, chart, labs and discussed the procedure including the risks, benefits and alternatives for the proposed anesthesia with the patient or authorized representative who has indicated his/her understanding and acceptance.   Dental advisory given  Plan Discussed with: CRNA  Anesthesia Plan Comments:         Anesthesia Quick Evaluation

## 2013-01-25 ENCOUNTER — Encounter (HOSPITAL_COMMUNITY): Payer: Self-pay | Admitting: General Surgery

## 2013-01-27 ENCOUNTER — Telehealth (INDEPENDENT_AMBULATORY_CARE_PROVIDER_SITE_OTHER): Payer: Self-pay

## 2013-01-27 NOTE — Telephone Encounter (Signed)
Patient called for pathology report.  She also needs a 2 week appointment.

## 2013-01-27 NOTE — Telephone Encounter (Signed)
Discuss pathology with the patient. Further inferior margin was negative for tumor.

## 2013-01-30 ENCOUNTER — Other Ambulatory Visit (INDEPENDENT_AMBULATORY_CARE_PROVIDER_SITE_OTHER): Payer: Medicare Other

## 2013-01-30 ENCOUNTER — Other Ambulatory Visit: Payer: Self-pay | Admitting: *Deleted

## 2013-01-30 DIAGNOSIS — E785 Hyperlipidemia, unspecified: Secondary | ICD-10-CM

## 2013-01-30 DIAGNOSIS — E1310 Other specified diabetes mellitus with ketoacidosis without coma: Secondary | ICD-10-CM | POA: Diagnosis not present

## 2013-01-30 DIAGNOSIS — E1110 Type 2 diabetes mellitus with ketoacidosis without coma: Secondary | ICD-10-CM

## 2013-01-30 LAB — HEMOGLOBIN A1C: Hgb A1c MFr Bld: 7.5 % — ABNORMAL HIGH (ref 4.6–6.5)

## 2013-01-30 LAB — HEPATIC FUNCTION PANEL
ALT: 18 U/L (ref 0–35)
AST: 17 U/L (ref 0–37)
Albumin: 3.8 g/dL (ref 3.5–5.2)
Alkaline Phosphatase: 57 U/L (ref 39–117)
Bilirubin, Direct: 0.1 mg/dL (ref 0.0–0.3)
Total Bilirubin: 0.7 mg/dL (ref 0.3–1.2)
Total Protein: 6.4 g/dL (ref 6.0–8.3)

## 2013-01-30 LAB — LIPID PANEL
Cholesterol: 148 mg/dL (ref 0–200)
HDL: 47.1 mg/dL (ref >39.00)
LDL Cholesterol: 65 mg/dL (ref 0–99)
Total CHOL/HDL Ratio: 3
Triglycerides: 179 mg/dL — ABNORMAL HIGH (ref 0.0–149.0)
VLDL: 35.8 mg/dL (ref 0.0–40.0)

## 2013-01-31 ENCOUNTER — Ambulatory Visit: Payer: Medicare Other

## 2013-01-31 ENCOUNTER — Ambulatory Visit: Payer: Medicare Other | Admitting: Radiation Oncology

## 2013-02-02 ENCOUNTER — Ambulatory Visit (INDEPENDENT_AMBULATORY_CARE_PROVIDER_SITE_OTHER): Payer: Medicare Other | Admitting: General Surgery

## 2013-02-02 ENCOUNTER — Encounter (INDEPENDENT_AMBULATORY_CARE_PROVIDER_SITE_OTHER): Payer: Self-pay | Admitting: General Surgery

## 2013-02-02 VITALS — BP 120/80 | HR 84 | Temp 98.6°F | Resp 14 | Ht 67.5 in | Wt 142.6 lb

## 2013-02-02 DIAGNOSIS — S92309A Fracture of unspecified metatarsal bone(s), unspecified foot, initial encounter for closed fracture: Secondary | ICD-10-CM | POA: Diagnosis not present

## 2013-02-02 DIAGNOSIS — C50119 Malignant neoplasm of central portion of unspecified female breast: Secondary | ICD-10-CM

## 2013-02-02 DIAGNOSIS — C50111 Malignant neoplasm of central portion of right female breast: Secondary | ICD-10-CM

## 2013-02-02 NOTE — Progress Notes (Signed)
History: The patient returns for followup status post reexcision of DCIS right breast due to positive anterior margin. This was negative for further tumor. Her margins are all now clear. She has little itching at the incision but no other complaints.  Exam: Appears well. Her incision is nicely healed without complication.  Assessment and plan: Doing well following lumpectomy for DCIS right breast approximately 2.3 cm with negative margins. She has followup arranged at the cancer Center for radiation and medical oncology. I will see her back in 6 months.

## 2013-02-03 DIAGNOSIS — S92309A Fracture of unspecified metatarsal bone(s), unspecified foot, initial encounter for closed fracture: Secondary | ICD-10-CM | POA: Diagnosis not present

## 2013-02-06 ENCOUNTER — Encounter: Payer: Self-pay | Admitting: Internal Medicine

## 2013-02-06 ENCOUNTER — Ambulatory Visit (INDEPENDENT_AMBULATORY_CARE_PROVIDER_SITE_OTHER): Payer: Medicare Other | Admitting: Internal Medicine

## 2013-02-06 VITALS — BP 136/80 | HR 72 | Temp 98.6°F | Resp 16 | Ht 67.5 in | Wt 141.0 lb

## 2013-02-06 DIAGNOSIS — D649 Anemia, unspecified: Secondary | ICD-10-CM

## 2013-02-06 DIAGNOSIS — I10 Essential (primary) hypertension: Secondary | ICD-10-CM | POA: Diagnosis not present

## 2013-02-06 DIAGNOSIS — Z23 Encounter for immunization: Secondary | ICD-10-CM | POA: Diagnosis not present

## 2013-02-06 DIAGNOSIS — E119 Type 2 diabetes mellitus without complications: Secondary | ICD-10-CM | POA: Diagnosis not present

## 2013-02-06 MED ORDER — SITAGLIP PHOS-METFORMIN HCL ER 100-1000 MG PO TB24: 1.0000 | ORAL_TABLET | Freq: Every day | ORAL | Status: DC

## 2013-02-06 NOTE — Progress Notes (Signed)
Subjective:    Patient ID: [name], female    DOB: [birth year], 79 y.o.   MRN: [id number]  HPI  This is an 79 year old female followed for hypertensionhistory of hyperlipidemia and a history of mild to moderate asthma and allergic rhinitis.  She presents today for routine followup for her DM Had surgery for breast cancer Has noted "passing out" episodes with possible hypoglycemia? Has noted falls with these episodes  Review of Systems  Constitutional: Negative for activity change, appetite change and fatigue.  HENT: Positive for congestion. Negative for ear pain, neck pain, postnasal drip and sinus pressure.   Eyes: Negative for redness and visual disturbance.  Respiratory: Negative for cough, shortness of breath and wheezing.   Gastrointestinal: Negative for abdominal pain and abdominal distention.  Genitourinary: Negative for dysuria, frequency and menstrual problem.  Musculoskeletal: Negative for myalgias, joint swelling and arthralgias.       Falls  Skin: Negative for rash and wound.  Neurological: Positive for syncope and light-headedness. Negative for dizziness, weakness and headaches.  Hematological: Negative for adenopathy. Does not bruise/bleed easily.  Psychiatric/Behavioral: Negative for sleep disturbance and decreased concentration.   Past Medical History  Diagnosis Date  . Seasonal allergies   . HTN (hypertension)   . Osteoarthritis   . Palpitations   . Anemia   . History of colonic polyps   . GERD (gastroesophageal reflux disease)   . Hyperlipidemia   . Chronic facial pain   . Vitamin B12 deficiency   . Dyslipidemia   . Chronic foot pain   . Chronic cough   . Obesity   . Anxiety   . Skin cancer   . Complication of anesthesia     Sore jaw; could not chew or move mouth  . Diabetes mellitus     type 2 niddm x 20 years  . Cancer of right breast 2014  . Melanoma   . Metatarsal bone fracture 2014  . Hammer toe     bilateral    History   Social  History  . Marital Status: Widowed    Spouse Name: N/A    Number of Children: N/A  . Years of Education: N/A   Occupational History  . retired    Social History Main Topics  . Smoking status: Never Smoker   . Smokeless tobacco: Not on file  . Alcohol Use: No  . Drug Use: No  . Sexual Activity: Yes   Other Topics Concern  . Not on file   Social History Narrative  . No narrative on file    Past Surgical History  Procedure Laterality Date  . Colonoscopy    . Polypectomy      small adenomatous  . Abdominal hysterectomy    . Cholecystectomy    . Hernia repair    . Knee arthroscopy Bilateral   . Ulnar tunnel release    . Eye surgery    . Cataract extraction w/ intraocular lens implant Right   . Bunionectomy    . Breast lumpectomy with needle localization Right 01/17/2013    Procedure: BREAST LUMPECTOMY WITH NEEDLE LOCALIZATION;  Surgeon: Mariella Saa, MD;  Location: [facility];  Service: General;  Laterality: Right;  . Breast biopsy Right 01/24/2013    Procedure: RE-EXCICION OF BREAST CANCER, ANTERIOR MARGINS;  Surgeon: Mariella Saa, MD;  Location: [facility];  Service: General;  Laterality: Right;    Family History  Problem Relation Age of Onset  . Colon cancer    . Heart  disease Mother   . Diabetes Father   . Pancreatic cancer Father   . Bone cancer Sister   . Prostate cancer Brother   . Colon cancer Brother   . Rectal cancer Sister     Allergies  Allergen Reactions  . Sular [Nisoldipine Er]     "severe headaches, swelling eyes, hands, feet, shortness of breath, weak, flushed face, brain boiling, fluid retention, high blood sugar, nervous, heart fast beating"  . Actonel [Risedronate Sodium]   . Amlodipine Besylate   . Aspirin   . Atacand [Candesartan]   . Bextra [Valdecoxib]   . Bisoprolol-Hydrochlorothiazide   . Bystolic [Nebivolol Hcl]     "extreme weakness, heaviness in legs & arms, swelling in legs/arms/face, swollen abdomen, pain in bladder, feet  pain, soreness in chest"  . Candesartan Cilexetil   . Carbamazepine   . Cefadroxil   . Cefdinir   . Celecoxib   . Cholestyramine     "itching rash on stomach, bloated, nausea, vomiting, sleeplessness, extreme pain in arms"  . Clonidine   . Clonidine Hydrochloride   . Codeine   . Ezetimibe   . Hydrazine Yellow [Tartrazine]     "does not reduce high blood pressure, pain in arm, high pressure, felt like I was on verge of heart attack, really weak"  . Hydrocodone   . Hydrocodone-Acetaminophen   . Meloxicam   . Methylprednisolone Sodium Succinate   . Morphine   . Nabumetone   . Naproxen   . Niacin   . Niaspan [Niacin Er]     "fast heart beat, high blood pressure"  . Nisoldipine   . Norvasc [Amlodipine Besylate]     "extreme fluid retention/pain)  . Optivar [Azelastine Hcl]     "sensitive to light"  . Penicillins   . Pseudoephedrine-Guaifenesin   . Risedronate Sodium   . Rofecoxib   . Ru-Tuss [Chlorphen-Pse-Atrop-Hyos-Scop]   . Sulfonamide Derivatives   . Sulphur [Sulfur]   . Telithromycin   . Telmisartan     "headache, difficulty urinating, high blood sugar, fluid retention"  . Terfenadine   . Trandolapril-Verapamil Hcl Er   . Ziac [Bisoprolol-Hydrochlorothiazide]     "stopped urination"  . Iodinated Diagnostic Agents Rash    "All over"  . Valium [Diazepam] Other (See Comments)    Makes her mean and hyper    Current Outpatient Prescriptions on File Prior to Visit  Medication Sig Dispense Refill  . acetaminophen (TYLENOL) 500 MG tablet Take 1,000 mg by mouth every 6 (six) hours as needed for pain.      Marland Kitchen ALPRAZolam (XANAX) 0.25 MG tablet Take 0.25 mg by mouth at bedtime as needed for anxiety.      . benzonatate (TESSALON) 100 MG capsule Take 100 mg by mouth daily as needed.      . calcium carbonate (OS-CAL) 600 MG TABS Take 600 mg by mouth 2 (two) times daily with a meal.       . cholecalciferol (VITAMIN D) 1000 UNITS tablet Take 1,000 Units by mouth daily at 6 (six)  AM.       . colestipol (COLESTID) 1 G tablet Take 2 g by mouth 2 (two) times daily.      . cyanocobalamin (,VITAMIN B-12,) 1000 MCG/ML injection Inject 1,000 mcg into the muscle every 21 ( twenty-one) days.      . CYANOCOBALAMIN IJ Inject 1 mL as directed every 21 ( twenty-one) days. Vitamin B-12 1000 mcg-ml injection.      . Diclofenac Sodium 3 %  GEL Place 1 application onto the skin 2 (two) times daily.      . ferrous sulfate 325 (65 FE) MG tablet Take 325 mg by mouth daily with breakfast.       . fexofenadine (ALLEGRA) 60 MG tablet Take 60 mg by mouth daily.       . fish oil-omega-3 fatty acids 1000 MG capsule Take 1 g by mouth 2 (two) times daily.       Marland Kitchen lidocaine (LIDODERM) 5 % Place 1 patch onto the skin daily. Remove & Discard patch within 12 hours or as directed by MD      . nateglinide (STARLIX) 120 MG tablet Take 120 mg by mouth 3 (three) times daily before meals.      . polyvinyl alcohol (LIQUIFILM TEARS) 1.4 % ophthalmic solution Place 1 drop into both eyes as needed (dry eyes).      . ranitidine (ZANTAC) 150 MG capsule Take 150 mg by mouth 2 (two) times daily.       Marland Kitchen TARKA 2-180 MG per tablet TAKE 1 TABLET DAILY  90 tablet  2  . traMADol (ULTRAM) 50 MG tablet Take 1-2 tablets (50-100 mg total) by mouth every 6 (six) hours as needed for pain.  20 tablet  1  . trandolapril-verapamil (TARKA) 2-180 MG per tablet Take 1 tablet by mouth daily.       Current Facility-Administered Medications on File Prior to Visit  Medication Dose Route Frequency Provider Last Rate Last Dose  . cyanocobalamin ((VITAMIN B-12)) injection 1,000 mcg  1,000 mcg Intramuscular Q30 days Stacie Glaze, MD   1,000 mcg at 02/13/11 0936    BP 136/80  Pulse 72  Temp(Src) 98.6 F (37 C)  Resp 16  Ht 5' 7.5" (1.715 m)  Wt 141 lb (63.957 kg)  BMI 21.75 kg/m2       Objective:   Physical Exam  Constitutional: She is oriented to person, place, and time. She appears well-developed and well-nourished.  Eyes:  Conjunctivae are normal. Pupils are equal, round, and reactive to light.  Cardiovascular: Normal rate and regular rhythm.   Murmur heard. Pulmonary/Chest: Effort normal and breath sounds normal.  Abdominal: Soft. Bowel sounds are normal.  Musculoskeletal: Normal range of motion.  Neurological: She is oriented to person, place, and time.  Psychiatric: She has a normal mood and affect. Her behavior is normal.          Assessment & Plan:  I suspect hypoglycemia with waves of weakness. Fury be due to the metformin  Trial of extend release with 1/2 the metformin

## 2013-02-16 ENCOUNTER — Encounter: Payer: Self-pay | Admitting: Radiation Oncology

## 2013-02-16 DIAGNOSIS — C50911 Malignant neoplasm of unspecified site of right female breast: Secondary | ICD-10-CM | POA: Insufficient documentation

## 2013-02-16 NOTE — Progress Notes (Signed)
Location of Breast Cancer: right 12:00 o'clock  Histology per Pathology Report:  12/26/12 Breast, right, needle core biopsy - DUCTAL CARCINOMA IN SITU WITH CALCIFICATIONS.  01/17/13 Diagnosis 1. 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 - DUCTAL CARCINOMA IN SITU SHOWS FOCAL NECROSIS AND ASSOCIATED CALCIFICATION. - ANTERIOR MARGIN IS POSITIVE FOR DUCTAL CARCINOMA IN SITU. - DUCTAL CARCINOMA IN SITU IS EXTREMELY CLOSE TO INFERIOR MARGIN (<0.1 CM) ON INITIAL LUMPECTOMY SPECIMEN, PLEASE SEE SPECIMEN #2 FOR FINAL MARGIN STATUS. - OTHER MARGINS ARE NEGATIVE - SEE ONCOLOGY TEMPLATE 2. Breast, excision, Right further inferior margin - BENIGN BREAST PARENCHYMA - FIBROADENOMA PRESENT. - NO HYPERPLASIA, ATYPIA, OR MALIGNANCY IDENTIFIED.  01/24/13 Breast, excision, right - BENIGN BREAST TISSUE WITH PREVIOUS BIOPSY SITE CHANGES. - NO EVIDENCE OF ATYPIA OR MALIGNANCY. - INKED MARGINS, NEGATIVE FOR ATYPIA OR MALIGNANCY.  Receptor Status: ER(-), PR (-), Her2-neu ()  Did patient present with symptoms (if so, please note symptoms) or was this found on screening mammography?: screening mammography  Past/Anticipated interventions by surgeon, if any: 01/17/13 right Lumpectomy, 01/24/13 re-excision of right breast  Past/Anticipated interventions by medical oncology, if any: Chemotherapy - none, FU appt w/Dr Welton Flakes 03/10/13  Lymphedema issues, if any:    none  Pain issues, if any:  no  SAFETY ISSUES:  Prior radiation? no  Pacemaker/ICD? no  Possible current pregnancy? no  Is the patient on methotrexate? no  Current Complaints / other details:  Widowed x 8 years, no children, performed office work. Pt reports "twinges in my right breast at times".    Glennie Hawk, RN 02/16/2013,1:26 PM

## 2013-02-21 ENCOUNTER — Encounter: Payer: Self-pay | Admitting: Radiation Oncology

## 2013-02-21 ENCOUNTER — Other Ambulatory Visit: Payer: Self-pay | Admitting: *Deleted

## 2013-02-21 ENCOUNTER — Telehealth: Payer: Self-pay | Admitting: *Deleted

## 2013-02-21 ENCOUNTER — Ambulatory Visit
Admission: RE | Admit: 2013-02-21 | Discharge: 2013-02-21 | Disposition: A | Discharge status: Home / Self Care | Payer: Medicare Other | Source: Ambulatory Visit | Attending: Radiation Oncology | Admitting: Radiation Oncology

## 2013-02-21 VITALS — BP 143/83 | HR 75 | Temp 98.4°F | Resp 20 | Wt 141.6 lb

## 2013-02-21 DIAGNOSIS — C50119 Malignant neoplasm of central portion of unspecified female breast: Secondary | ICD-10-CM | POA: Diagnosis not present

## 2013-02-21 DIAGNOSIS — D0590 Unspecified type of carcinoma in situ of unspecified breast: Secondary | ICD-10-CM | POA: Diagnosis not present

## 2013-02-21 DIAGNOSIS — C50111 Malignant neoplasm of central portion of right female breast: Secondary | ICD-10-CM

## 2013-02-21 MED ORDER — SITAGLIP PHOS-METFORMIN HCL ER 100-1000 MG PO TB24: 1.0000 | ORAL_TABLET | Freq: Every day | ORAL | Status: DC

## 2013-02-21 NOTE — Progress Notes (Signed)
Please see the Nurse Progress Note in the MD Initial Consult Encounter for this patient. 

## 2013-02-21 NOTE — Progress Notes (Signed)
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     ALLERGIES: Sular; Actonel; Amlodipine besylate; Aspirin; Atacand; Bextra; Bisoprolol-hydrochlorothiazide; Bystolic; Candesartan cilexetil; Carbamazepine; Cefadroxil; Cefdinir; Celecoxib; Cholestyramine; Clonidine; Clonidine hydrochloride; Codeine; Ezetimibe; Hydrazine yellow; Hydrocodone; Hydrocodone-acetaminophen; Meloxicam; Methylprednisolone sodium succinate; Morphine; Nabumetone; Naproxen; Niacin; Niaspan; Nisoldipine; Norvasc; Optivar; Penicillins; Pseudoephedrine-guaifenesin; Risedronate sodium; Rofecoxib; Ru-tuss; Sulfonamide derivatives; Sulphur; Telithromycin; Telmisartan; Terfenadine; Trandolapril-verapamil hcl er; Ziac; Iodinated diagnostic agents; and Valium   MEDICATIONS:  Current Outpatient Prescriptions  Medication Sig Dispense Refill  . acetaminophen (TYLENOL) 500 MG tablet Take 1,000 mg by mouth every 6 (six) hours as needed for pain.      Marland Kitchen ALPRAZolam (XANAX) 0.25 MG tablet Take 0.25 mg by mouth at bedtime as needed for anxiety.      . benzonatate (TESSALON) 100 MG capsule Take 100 mg by mouth daily as needed.      . calcium carbonate (OS-CAL) 600 MG TABS Take 600 mg by mouth 2 (two) times daily with a meal.       . cholecalciferol (VITAMIN D) 1000 UNITS tablet Take 1,000 Units by mouth daily at 6 (six) AM.       . colestipol (COLESTID) 1 G tablet Take 2 g by mouth 2 (two) times daily.      . cyanocobalamin (,VITAMIN B-12,) 1000 MCG/ML injection Inject 1,000 mcg into the muscle every 21 ( twenty-one) days.      . CYANOCOBALAMIN IJ Inject 1 mL as directed every 21 ( twenty-one) days. Vitamin B-12 1000 mcg-ml injection.      . Diclofenac Sodium 3 % GEL Place 1 application onto the skin 2 (two) times  daily.      . ferrous sulfate 325 (65 FE) MG tablet Take 325 mg by mouth daily with breakfast.       . fexofenadine (ALLEGRA) 60 MG tablet Take 60 mg by mouth daily.       . fish oil-omega-3 fatty acids 1000 MG capsule Take 1 g by mouth 2 (two) times daily.       Marland Kitchen lidocaine (LIDODERM) 5 % Place 1 patch onto the skin daily. Remove & Discard patch within 12 hours or as directed by MD      . nateglinide (STARLIX) 120 MG tablet Take 120 mg by mouth 3 (three) times daily before meals.      . polyvinyl alcohol (LIQUIFILM TEARS) 1.4 % ophthalmic solution Place 1 drop into both eyes as needed (dry eyes).      . ranitidine (ZANTAC) 150 MG capsule Take 150 mg by mouth 2 (two) times daily.       . SitaGLIPtin-MetFORMIN HCl (JANUMET XR) [phone] MG TB24 Take 1 tablet by mouth daily.  90 tablet  3  . TARKA 2-180 MG per tablet TAKE 1 TABLET DAILY  90 tablet  2  . traMADol (ULTRAM) 50 MG tablet Take 1-2 tablets (50-100 mg total) by mouth every 6 (six) hours as needed for pain.  20 tablet  1  . trandolapril-verapamil (TARKA) 2-180 MG per tablet Take 1 tablet by mouth daily.       Current Facility-Administered Medications  Medication Dose Route Frequency Provider Last Rate Last Dose  . cyanocobalamin ((VITAMIN B-12)) injection 1,000 mcg  1,000 mcg Intramuscular Q30 days Balinda Quails  Lovell Sheehan, MD   1,000 mcg at 02/13/11 1610     NARRATIVE: [name] visits today for review and scheduling of her right breast radiation therapy. I first saw the patient at the [facility] on September 3 after she presented with suspicious calcifications within the right breast at approximately 12:00. A stereotactic biopsy on 12/26/2012 was diagnostic for high-grade DCIS which was ER/PR negative. Dr. Johna Sheriff performed a right partial mastectomy on 01/17/2013. She was found have intermediate to high-grade DCIS measuring approximately 2.8 cm, and necrosis was seen. The margin was less than 0.1 cm, inferiorly. Therefore, she underwent reexcision on  01/24/2013 a previous biopsy site changes were seen with no evidence for residual malignancy or atypia. She is doing well postoperatively.   PHYSICAL EXAM:   weight is 141 lb 9.6 oz (64.229 kg). Her oral temperature is 98.4 F (36.9 C). Her blood pressure is 143/83 and her pulse is 75. Her respiration is 20.  Head and neck examination: Grossly unremarkable. Nodes: Without palpable cervical, supraclavicular, or axillary lymphadenopathy. Chest: Lungs clear. Breasts: There is a periareolar partial mastectomy wound extending from 9 to 2:00 along the right breast. No masses are appreciated. Left breast without masses or lesions. Extremities: Without edema.   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 We discussed local treatment options which include mastectomy versus partial mastectomy and consideration of postoperative radiation therapy. She desires breast preservation. Since she had a negative reexcision, I do not feel strongly about obtaining a baseline mammogram prior to initiation of radiation therapy. Radiation therapy options include hypofractionated radiation therapy versus standard fractionation. I think is a good candidate for hypo-fractionated radiation therapy. I anticipate delivering approximately 4250 cGy in 17 sessions. We discussed the potential acute and late toxicities of radiation therapy. Consent is signed today.    PLAN: She will return for simulation/treatment planning early next week.   I spent 30 minutes minutes face to face with the  patient and more than 50% of that time was spent in counseling and/or coordination of care.

## 2013-02-21 NOTE — Telephone Encounter (Signed)
Left message on machine resent

## 2013-02-21 NOTE — Addendum Note (Signed)
Encounter addended by: Glennie Hawk, RN on: 02/21/2013  3:27 PM<BR>     Documentation filed: Charges VN

## 2013-02-21 NOTE — Telephone Encounter (Signed)
Pt states she did not receive Janumet XR from [facility]. She received the old dose Janumet not xr. Pt needs new rx sent to them and would like a call back .

## 2013-02-27 ENCOUNTER — Ambulatory Visit
Admission: RE | Admit: 2013-02-27 | Discharge: 2013-02-27 | Disposition: A | Discharge status: Home / Self Care | Payer: Medicare Other | Source: Ambulatory Visit | Attending: Radiation Oncology | Admitting: Radiation Oncology

## 2013-02-27 DIAGNOSIS — R5381 Other malaise: Secondary | ICD-10-CM | POA: Diagnosis not present

## 2013-02-27 DIAGNOSIS — Z510 Encounter for antineoplastic radiation therapy: Secondary | ICD-10-CM | POA: Diagnosis not present

## 2013-02-27 DIAGNOSIS — C50111 Malignant neoplasm of central portion of right female breast: Secondary | ICD-10-CM

## 2013-02-27 DIAGNOSIS — L539 Erythematous condition, unspecified: Secondary | ICD-10-CM | POA: Diagnosis not present

## 2013-02-27 DIAGNOSIS — C50119 Malignant neoplasm of central portion of unspecified female breast: Secondary | ICD-10-CM | POA: Diagnosis not present

## 2013-02-27 DIAGNOSIS — C50919 Malignant neoplasm of unspecified site of unspecified female breast: Secondary | ICD-10-CM | POA: Diagnosis not present

## 2013-02-27 NOTE — Progress Notes (Signed)
Complex simulation/treatment planning note: The patient was placed supine on a custom breast board. A custom neck mold was constructed for immobilization. Her right breast field borders were marked with radiopaque wires along her partial mastectomy scar. She was then scanned. She was set up to medial and lateral right breast tangents. 2 separate multileaf collimators were designed to conform the field. I prescribing 4250 cGy 17 sessions utilizing 6 MV photons. There is no boost.

## 2013-03-06 ENCOUNTER — Ambulatory Visit
Admission: RE | Admit: 2013-03-06 | Discharge: 2013-03-06 | Disposition: A | Discharge status: Home / Self Care | Payer: Medicare Other | Source: Ambulatory Visit | Attending: Radiation Oncology | Admitting: Radiation Oncology

## 2013-03-06 DIAGNOSIS — R5381 Other malaise: Secondary | ICD-10-CM | POA: Diagnosis not present

## 2013-03-06 DIAGNOSIS — C50919 Malignant neoplasm of unspecified site of unspecified female breast: Secondary | ICD-10-CM | POA: Diagnosis not present

## 2013-03-06 DIAGNOSIS — C50119 Malignant neoplasm of central portion of unspecified female breast: Secondary | ICD-10-CM | POA: Diagnosis not present

## 2013-03-06 DIAGNOSIS — L539 Erythematous condition, unspecified: Secondary | ICD-10-CM | POA: Diagnosis not present

## 2013-03-06 DIAGNOSIS — Z510 Encounter for antineoplastic radiation therapy: Secondary | ICD-10-CM | POA: Diagnosis not present

## 2013-03-07 ENCOUNTER — Ambulatory Visit
Admission: RE | Admit: 2013-03-07 | Discharge: 2013-03-07 | Disposition: A | Discharge status: Home / Self Care | Payer: Medicare Other | Source: Ambulatory Visit | Attending: Radiation Oncology | Admitting: Radiation Oncology

## 2013-03-07 DIAGNOSIS — L539 Erythematous condition, unspecified: Secondary | ICD-10-CM | POA: Diagnosis not present

## 2013-03-07 DIAGNOSIS — R5381 Other malaise: Secondary | ICD-10-CM | POA: Diagnosis not present

## 2013-03-07 DIAGNOSIS — C50919 Malignant neoplasm of unspecified site of unspecified female breast: Secondary | ICD-10-CM | POA: Diagnosis not present

## 2013-03-07 DIAGNOSIS — Z510 Encounter for antineoplastic radiation therapy: Secondary | ICD-10-CM | POA: Diagnosis not present

## 2013-03-07 DIAGNOSIS — C50119 Malignant neoplasm of central portion of unspecified female breast: Secondary | ICD-10-CM | POA: Diagnosis not present

## 2013-03-08 ENCOUNTER — Ambulatory Visit
Admission: RE | Admit: 2013-03-08 | Discharge: 2013-03-08 | Disposition: A | Discharge status: Home / Self Care | Payer: Medicare Other | Source: Ambulatory Visit | Attending: Radiation Oncology | Admitting: Radiation Oncology

## 2013-03-08 DIAGNOSIS — R5381 Other malaise: Secondary | ICD-10-CM | POA: Diagnosis not present

## 2013-03-08 DIAGNOSIS — Z510 Encounter for antineoplastic radiation therapy: Secondary | ICD-10-CM | POA: Diagnosis not present

## 2013-03-08 DIAGNOSIS — C50919 Malignant neoplasm of unspecified site of unspecified female breast: Secondary | ICD-10-CM | POA: Diagnosis not present

## 2013-03-08 DIAGNOSIS — L539 Erythematous condition, unspecified: Secondary | ICD-10-CM | POA: Diagnosis not present

## 2013-03-08 DIAGNOSIS — C50111 Malignant neoplasm of central portion of right female breast: Secondary | ICD-10-CM

## 2013-03-08 MED ORDER — RADIAPLEXRX EX GEL
Freq: Once | CUTANEOUS | Status: AC
  Administered 2013-03-08: 15:00:00 via TOPICAL

## 2013-03-08 MED ORDER — ALRA NON-METALLIC DEODORANT (RAD-ONC)
1.0000 "application " | Freq: Once | TOPICAL | Status: AC
  Administered 2013-03-08: 1 via TOPICAL

## 2013-03-08 NOTE — Progress Notes (Signed)
Post sim ed completed w/pt. Gave pt "Radiation and You" booklet w/all pertinent information marked and discussed, re: fatigue, skin irritation/care, pain, nutrition. Gave pt Radiaplex and Alra w/instructions for proper use. Pt verbalized understanding.

## 2013-03-09 ENCOUNTER — Ambulatory Visit
Admission: RE | Admit: 2013-03-09 | Discharge: 2013-03-09 | Disposition: A | Discharge status: Home / Self Care | Payer: Medicare Other | Source: Ambulatory Visit | Attending: Radiation Oncology | Admitting: Radiation Oncology

## 2013-03-09 DIAGNOSIS — C50919 Malignant neoplasm of unspecified site of unspecified female breast: Secondary | ICD-10-CM | POA: Diagnosis not present

## 2013-03-09 DIAGNOSIS — S92309A Fracture of unspecified metatarsal bone(s), unspecified foot, initial encounter for closed fracture: Secondary | ICD-10-CM | POA: Diagnosis not present

## 2013-03-09 DIAGNOSIS — Z510 Encounter for antineoplastic radiation therapy: Secondary | ICD-10-CM | POA: Diagnosis not present

## 2013-03-09 DIAGNOSIS — R5381 Other malaise: Secondary | ICD-10-CM | POA: Diagnosis not present

## 2013-03-09 DIAGNOSIS — L539 Erythematous condition, unspecified: Secondary | ICD-10-CM | POA: Diagnosis not present

## 2013-03-10 ENCOUNTER — Ambulatory Visit
Admission: RE | Admit: 2013-03-10 | Discharge: 2013-03-10 | Disposition: A | Discharge status: Home / Self Care | Payer: Medicare Other | Source: Ambulatory Visit | Attending: Radiation Oncology | Admitting: Radiation Oncology

## 2013-03-10 ENCOUNTER — Telehealth: Payer: Self-pay | Admitting: *Deleted

## 2013-03-10 ENCOUNTER — Encounter: Payer: Self-pay | Admitting: Oncology

## 2013-03-10 ENCOUNTER — Ambulatory Visit (HOSPITAL_BASED_OUTPATIENT_CLINIC_OR_DEPARTMENT_OTHER): Payer: Medicare Other | Admitting: Oncology

## 2013-03-10 VITALS — BP 162/84 | HR 82 | Temp 98.4°F | Resp 18 | Ht 67.5 in | Wt 141.3 lb

## 2013-03-10 DIAGNOSIS — R5381 Other malaise: Secondary | ICD-10-CM | POA: Diagnosis not present

## 2013-03-10 DIAGNOSIS — C50111 Malignant neoplasm of central portion of right female breast: Secondary | ICD-10-CM

## 2013-03-10 DIAGNOSIS — Z510 Encounter for antineoplastic radiation therapy: Secondary | ICD-10-CM | POA: Diagnosis not present

## 2013-03-10 DIAGNOSIS — Z171 Estrogen receptor negative status [ER-]: Secondary | ICD-10-CM | POA: Diagnosis not present

## 2013-03-10 DIAGNOSIS — L539 Erythematous condition, unspecified: Secondary | ICD-10-CM | POA: Diagnosis not present

## 2013-03-10 DIAGNOSIS — C50919 Malignant neoplasm of unspecified site of unspecified female breast: Secondary | ICD-10-CM | POA: Diagnosis not present

## 2013-03-10 DIAGNOSIS — D0590 Unspecified type of carcinoma in situ of unspecified breast: Secondary | ICD-10-CM | POA: Diagnosis not present

## 2013-03-10 NOTE — Progress Notes (Signed)
[name] [id number] Apr 28, 1930 79 y.o. 03/10/2013 12:50 PM  CC  Yolanda Mew, MD [address] [place] [address] Dr. Chipper [name]  Dr. Glenna [name]  Diagnosis:  79 year old female with new diagnosis of right ductal carcinoma in situ. Patient is seen in the multidisciplinary breast clinic for discussion of treatment options.   STAGE:   Cancer of central portion of female breast   Primary site: Breast (Right)   Staging method: AJCC 7th Edition   Clinical: Stage 0 (Tis (DCIS), N0, cM0)   Summary: Stage 0 (Tis (DCIS), N0, cM0)  REFERRING PHYSICIAN: Dr. Glenna [name]  Prior therapy:  [name] is a 79 y.o. female with :  #1 Patient had a screening mammogram performed on 11/16/2012 which was suspicious for calcifications. She went on to have a diagnostic mammogram of the right breast on 12/12/2012, this revealed group of pleomorphic microcalcifications with linear forms (and arranged in a linear distribution) located within the anterior right breast at 12:00 position 1.2 cm superior to the nipple. The groups band 1.1 cm in greatest dimension. These were worrisome for DCIS. Because of this patient had a stereotactic biopsy performed on 12/26/2012. The pathology revealed ductal carcinoma in situ with calcifications high-grade estrogen receptor and progesterone receptor negative at 0% respectively. Patient had MRI of the breasts performed on 01/03/2013. In the right breast just above the nipple there was an area of non-mass enhancement associated with biopsy clip artifact. This area measured 1.7 x 1.2 x 0.9 cm. Lateral to this enhancement there were biopsy tract artifact. Elsewhere within the right breast no suspicious enhancement identified. Left breast no mass or abnormal enhancement. Lymph nodes no abnormal appearing lymph nodes.  #2 patient is status post lumpectomy  Performed 01/17/2013 with the final pathology revealing: Diagnosis  1. Breast, lumpectomy,  Right  - INTERMEDIATE TO HIGH GRADE DUCTAL CARCINOMA IN SITU INVOLVING APPROXIMATELY 2.8  CM OF BREAST PARENCHYMA.  - DUCTAL CARCINOMA IN SITU SHOWS FOCAL NECROSIS AND ASSOCIATED CALCIFICATION.  - ANTERIOR MARGIN IS POSITIVE FOR DUCTAL CARCINOMA IN SITU.  - DUCTAL CARCINOMA IN SITU IS EXTREMELY CLOSE TO INFERIOR MARGIN (<0.1 CM) ON  INITIAL LUMPECTOMY SPECIMEN,  PLEASE SEE SPECIMEN #2 FOR FINAL MARGIN STATUS.  - OTHER MARGINS ARE NEGATIVE  - SEE ONCOLOGY TEMPLATE  2. Breast, excision, Right further inferior margin  - BENIGN BREAST PARENCHYMA  - FIBROADENOMA PRESENT.  - NO HYPERPLASIA, ATYPIA, OR MALIGNANCY IDENTIFIED.  01/24/13  Breast, excision, right  - BENIGN BREAST TISSUE WITH PREVIOUS BIOPSY SITE CHANGES.  - NO EVIDENCE OF ATYPIA OR MALIGNANCY.  - INKED MARGINS, NEGATIVE FOR ATYPIA OR MALIGNANCY.  #3 patient has been seen by Dr. Chipper [name] and she will undergo postlumpectomy radiation therapy   current therapy: Curative intent radiation therapy   Interval history: Patient is seen in followup today. She has had her lumpectomy of the right breast. Her final pathology revealed only DCIS. Postoperatively she has done well. Her tumor was ER negative. She has been seen by Dr. Chipper [name] and is undergoing postlumpectomy radiation therapy which started this past Tuesday. So far she is tolerating it well. She currently has no complaints. Remainder of the 10 point review of systems is negative.   Past Medical History: Past Medical History  Diagnosis Date  . Seasonal allergies   . HTN (hypertension)   . Osteoarthritis   . Palpitations   . Anemia   . History of colonic polyps   . GERD (gastroesophageal reflux disease)   .  Hyperlipidemia   . Chronic facial pain   . Vitamin B12 deficiency   . Dyslipidemia   . Chronic foot pain   . Chronic cough   . Obesity   . Anxiety   . Skin cancer   . Complication of anesthesia     Sore jaw; could not chew or move mouth  . Diabetes  mellitus     type 2 niddm x 20 years  . Melanoma   . Metatarsal bone fracture 2014  . Hammer toe     bilateral  . Cancer of right breast 12/26/12    right breast 12:00 o'clock    Past Surgical History: Past Surgical History  Procedure Laterality Date  . Colonoscopy    . Polypectomy      small adenomatous  . Abdominal hysterectomy    . Cholecystectomy    . Hernia repair    . Knee arthroscopy Bilateral   . Ulnar tunnel release    . Eye surgery    . Cataract extraction w/ intraocular lens implant Right   . Bunionectomy    . Breast lumpectomy with needle localization Right 01/17/2013    Procedure: BREAST LUMPECTOMY WITH NEEDLE LOCALIZATION;  Surgeon: Mariella Saa, MD;  Location: [facility] OR;  Service: General;  Laterality: Right;  . Breast biopsy Right 01/24/2013    Procedure: RE-EXCICION OF BREAST CANCER, ANTERIOR MARGINS;  Surgeon: Mariella Saa, MD;  Location: [facility];  Service: General;  Laterality: Right;    Family History: Family History  Problem Relation Age of Onset  . Colon cancer    . Heart disease Mother   . Diabetes Father   . Pancreatic cancer Father   . Bone cancer Sister   . Prostate cancer Brother   . Colon cancer Brother   . Rectal cancer Sister     Social History History  Substance Use Topics  . Smoking status: Never Smoker   . Smokeless tobacco: Not on file  . Alcohol Use: No    Allergies: Allergies  Allergen Reactions  . Sular [Nisoldipine Er]     "severe headaches, swelling eyes, hands, feet, shortness of breath, weak, flushed face, brain boiling, fluid retention, high blood sugar, nervous, heart fast beating"  . Actonel [Risedronate Sodium]   . Amlodipine Besylate   . Aspirin   . Atacand [Candesartan]   . Bextra [Valdecoxib]   . Bisoprolol-Hydrochlorothiazide   . Bystolic [Nebivolol Hcl]     "extreme weakness, heaviness in legs & arms, swelling in legs/arms/face, swollen abdomen, pain in bladder, feet pain, soreness in chest"  .  Candesartan Cilexetil   . Carbamazepine   . Cefadroxil   . Cefdinir   . Celecoxib   . Cholestyramine     "itching rash on stomach, bloated, nausea, vomiting, sleeplessness, extreme pain in arms"  . Clonidine   . Clonidine Hydrochloride   . Codeine   . Ezetimibe   . Hydrazine Yellow [Tartrazine]     "does not reduce high blood pressure, pain in arm, high pressure, felt like I was on verge of heart attack, really weak"  . Hydrocodone   . Hydrocodone-Acetaminophen   . Meloxicam   . Methylprednisolone Sodium Succinate   . Morphine   . Nabumetone   . Naproxen   . Niacin   . Niaspan [Niacin Er]     "fast heart beat, high blood pressure"  . Nisoldipine   . Norvasc [Amlodipine Besylate]     "extreme fluid retention/pain)  . Aliene Altes Hcl]     "  sensitive to light"  . Penicillins   . Pseudoephedrine-Guaifenesin   . Risedronate Sodium   . Rofecoxib   . Ru-Tuss [Chlorphen-Pse-Atrop-Hyos-Scop]   . Sulfonamide Derivatives   . Sulphur [Sulfur]   . Telithromycin   . Telmisartan     "headache, difficulty urinating, high blood sugar, fluid retention"  . Terfenadine   . Trandolapril-Verapamil Hcl Er   . Ziac [Bisoprolol-Hydrochlorothiazide]     "stopped urination"  . Iodinated Diagnostic Agents Rash    "All over"  . Valium [Diazepam] Other (See Comments)    Makes her mean and hyper    Current Medications: Current Outpatient Prescriptions  Medication Sig Dispense Refill  . acetaminophen (TYLENOL) 500 MG tablet Take 1,000 mg by mouth every 6 (six) hours as needed for pain.      Marland Kitchen ALPRAZolam (XANAX) 0.25 MG tablet Take 0.25 mg by mouth at bedtime as needed for anxiety.      . benzonatate (TESSALON) 100 MG capsule Take 100 mg by mouth daily as needed.      . calcium carbonate (OS-CAL) 600 MG TABS Take 600 mg by mouth 2 (two) times daily with a meal.       . cholecalciferol (VITAMIN D) 1000 UNITS tablet Take 1,000 Units by mouth daily at 6 (six) AM.       . colestipol  (COLESTID) 1 G tablet Take 2 g by mouth 2 (two) times daily.      . cyanocobalamin (,VITAMIN B-12,) 1000 MCG/ML injection Inject 1,000 mcg into the muscle every 21 ( twenty-one) days.      . CYANOCOBALAMIN IJ Inject 1 mL as directed every 21 ( twenty-one) days. Vitamin B-12 1000 mcg-ml injection.      . Diclofenac Sodium 3 % GEL Place 1 application onto the skin 2 (two) times daily.      . ferrous sulfate 325 (65 FE) MG tablet Take 325 mg by mouth daily with breakfast.       . fexofenadine (ALLEGRA) 60 MG tablet Take 60 mg by mouth daily.       . fish oil-omega-3 fatty acids 1000 MG capsule Take 1 g by mouth 2 (two) times daily.       . hyaluronate sodium (RADIAPLEXRX) GEL Apply 1 application topically 2 (two) times daily.      Marland Kitchen lidocaine (LIDODERM) 5 % Place 1 patch onto the skin daily. Remove & Discard patch within 12 hours or as directed by MD      . nateglinide (STARLIX) 120 MG tablet Take 120 mg by mouth 3 (three) times daily before meals.      . polyvinyl alcohol (LIQUIFILM TEARS) 1.4 % ophthalmic solution Place 1 drop into both eyes as needed (dry eyes).      . ranitidine (ZANTAC) 150 MG capsule Take 150 mg by mouth 2 (two) times daily.       . SitaGLIPtin-MetFORMIN HCl (JANUMET XR) [phone] MG TB24 Take 1 tablet by mouth daily.  90 tablet  3  . TARKA 2-180 MG per tablet TAKE 1 TABLET DAILY  90 tablet  2  . traMADol (ULTRAM) 50 MG tablet Take 1-2 tablets (50-100 mg total) by mouth every 6 (six) hours as needed for pain.  20 tablet  1  . trandolapril-verapamil (TARKA) 2-180 MG per tablet Take 1 tablet by mouth daily.       Current Facility-Administered Medications  Medication Dose Route Frequency Provider Last Rate Last Dose  . cyanocobalamin ((VITAMIN B-12)) injection 1,000 mcg  1,000 mcg Intramuscular  Q30 days Stacie Glaze, MD   1,000 mcg at 02/13/11 1610    OB/GYN History: Menarche at age 65 patient underwent surgical menopause at 68 with a hysterectomy. She only received 2-3 months   of hormone replacement therapy. She is nulliparous she had one pregnancy that ended up in miscarriage  Fertility Discussion: Not applicable Prior History of Cancer: Skin melanoma  Health Maintenance:  Colonoscopy yes  Bone Density no Last PAP smear unknown  ECOG PERFORMANCE STATUS: 1 - Symptomatic but completely ambulatory  Genetic Counseling/testing: no  REVIEW OF SYSTEMS:  A comprehensive review of systems was negative.  PHYSICAL EXAMINATION: Blood pressure 162/84, pulse 82, temperature 98.4 F (36.9 C), temperature source Oral, resp. rate 18, height 5' 7.5" (1.715 m), weight 141 lb 4.8 oz (64.093 kg).  RUE:AVWUJ, healthy, no distress, well nourished and well developed SKIN: skin color, texture, turgor are normal HEAD: Normocephalic EYES: PERRLA, EOMI EARS: External ears normal OROPHARYNX:no exudate, no erythema and lips, buccal mucosa, and tongue normal  NECK: supple, no adenopathy LYMPH:  no palpable lymphadenopathy, no hepatosplenomegaly BREAST:left breast normal without mass, skin or nipple changes or axillary nodes, abnormal mass palpable in the right breast healing lumpectomy scar  LUNGS: clear to auscultation and percussion HEART: regular rate & rhythm ABDOMEN:abdomen soft, non-tender, normal bowel sounds and no masses or organomegaly BACK: No CVA tenderness EXTREMITIES:no clubbing, no cyanosis, left foot edema secondary to recent fall  NEURO: Grossly normal     STUDIES/RESULTS: Mr Breast Bilateral W Wo Contrast  01/03/2013   *RADIOLOGY REPORT*  Clinical Data:Newly diagnosed ductal carcinoma in situ following stereotactic guided core biopsy of pleomorphic calcifications in the right breast.  BUN and creatinine were obtained on site at [facility] at [address] Results:  BUN 9.0 mg/dL,  Creatinine 0.9 mg/dL.  BILATERAL BREAST MRI WITH AND WITHOUT CONTRAST  Technique: Multiplanar, multisequence MR images of both breasts were obtained prior to and  following the intravenous administration of 13ml of Multihance.  THREE-DIMENSIONAL MR IMAGE RENDERING ON INDEPENDENT WORKSTATION: Three-dimensional MR images were rendered by post-processing  of the original MR data on an independent DynaCad workstation. The three-dimensional MR images were interpreted, and findings are reported in the following complete MRI report for this study.  Comparison:  Mammogram from [facility] 12/26/2012 and earlier  FINDINGS:  Breast composition:  c:  Heterogeneous fibroglandular tissue  Background parenchymal enhancement: Minimal  Right breast:  Just above the right nipple, there is an area of non mass enhancement associated with biopsy clip artifact.  The enhancement shows slow wash-in/persistent type enhancement kinetics and measures 1.7 x 1.2 x 0.9 cm.  Lateral to this enhancement, there is biopsy tract artifact.  Findings are consistent with known area of ductal carcinoma in situ on recent biopsy.  Elsewhere within the right breast, no suspicious enhancement identified.  Left breast:  No mass or abnormal enhancement.  Lymph nodes:  No abnormal appearing lymph nodes.  Ancillary findings:  None.  IMPRESSION:  1.  Non mass enhancement within the area of known ductal carcinoma in situ in the right breast. 2.  No significant findings in the left breast.  RECOMMENDATION: Treatment plan  BI-RADS CATEGORY 6:  Known biopsy-proven malignancy - appropriate action should be taken.   Original Report Authenticated By: Norva Pavlov, M.D.   [facility] [employer] R  12/12/2012   *RADIOLOGY REPORT*  Clinical Data:  Recall from screening mammography  DIGITAL DIAGNOSTIC RIGHT BREAST MAMMOGRAM  Comparison:  Previous examinations.  Findings:  ACR Breast Density Category c:  The breast tissue is heterogeneously dense, which Cowie obscure small masses.  Magnification views the right breast demonstrate a group of pleomorphic microcalcifications with linear forms (and arranged in  a linear distribution) located within the anterior right breast at 12 o'clock position 1.2 cm superior to the nipple.  The group spans 1.1 cm in greatest dimension.  These calcifications are worrisome for DCIS.  I have discussed stereotactic core biopsy with the patient.  Per patient preference the patient has been scheduled for 12/26/2012 and 03:00 p.m.  IMPRESSION: Suspicious group of calcifications located within the right breast at 12 o'clock position (1.1 cm in diameter).  Stereotactic core biopsy is recommended and has been scheduled for 12/26/2012.  RECOMMENDATION: Right breast stereotactic core biopsy.  I have discussed the findings and recommendations with the patient. Results were also provided in writing at the conclusion of the visit.  If applicable, a reminder letter will be sent to the patient regarding her next appointment.  BI-RADS CATEGORY 4:  Suspicious abnormality - biopsy should be considered.   Original Report Authenticated By: Rolla Plate, M.D.   Mm [facility]  12/27/2012   *RADIOLOGY REPORT*  ESTABLISHED PATIENT OFFICE VISIT - LEVEL III [phone])  Chief Complaint:  Patient status post right breast biopsy.  History:  Patient with right breast suspicious calcifications. A right breast biopsy of these calcifications was then performed.  Review of Systems:  Nothing pertinent.  Exam:  Patient is in no acute distress. A small amount of blood is seen at the biopsy site. No erythema.  Pathology:  Ductal carcinoma in situ with calcifications.  Assessment and Plan:  Patient with right breast calcifications status post core biopsy, with pathology demonstrating DCIS. This is a concordant result. The patient has been given a breast MRI appointment for 01/03/2013 and a multidisciplinary clinic appointment for 01/04/2013.   Original Report Authenticated By: Jerene Dilling, M.D.   Mm Rt Breast Bx W Loc Dev 1st Lesion Image Bx Spec Stereo Guide  12/26/2012   *RADIOLOGY REPORT*  Clinical  Data:  Suspicious right breast calcifications for biopsy  STEREOTACTIC-GUIDED VACUUM ASSISTED BIOPSY OF THE RIGHT BREAST AND SPECIMEN RADIOGRAPH  Comparison: Previous exams.  I met with the patient and we discussed the procedure of stereotactic-guided biopsy, including benefits and alternatives. We discussed the high likelihood of a successful procedure. We discussed the risks of the procedure, including infection, bleeding, tissue injury, clip migration, and inadequate sampling. Informed, written consent was given. The usual time-out protocol was performed immediately prior to the procedure.  Using sterile technique and 2% Lidocaine as local anesthetic, under stereotactic guidance, a 9 gauge vacuum-assisted device was used to perform core needle biopsy of right breast calcifications using a lateral approach.  Specimen radiograph was performed, showing inclusion of calcifications of concern.  Specimens with calcifications are identified for pathology.  At the conclusion of the procedure, a  tissue marker clip was deployed into the biopsy cavity.  Follow-up 2-view mammogram confirmed clip 9 mm cranial to the clustered calcifications.  IMPRESSION: Stereotactic-guided biopsy of right breast.  No apparent complications.   Original Report Authenticated By: Sherian Rein, M.D.     LABS:    Chemistry      Component Value Date/Time   NA 135 01/05/2013 1805   NA 138 01/04/2013 1212   K 4.2 01/05/2013 1805   K 3.9 01/04/2013 1212   CL 101 01/05/2013 1805   CO2 22  01/04/2013 1212   CO2 22 10/03/2012 1421   BUN 10 01/05/2013 1805   BUN 10.3 01/04/2013 1212   CREATININE 0.80 01/05/2013 1805   CREATININE 1.0 01/04/2013 1212      Component Value Date/Time   CALCIUM 9.6 01/04/2013 1212   CALCIUM 9.3 10/03/2012 1421   ALKPHOS 57 01/30/2013 1126   ALKPHOS 52 01/04/2013 1212   AST 17 01/30/2013 1126   AST 14 01/04/2013 1212   ALT 18 01/30/2013 1126   ALT 16 01/04/2013 1212   BILITOT 0.7 01/30/2013 1126   BILITOT 1.01 01/04/2013 1212       Lab Results  Component Value Date   WBC 7.5 01/05/2013   HGB 11.9* 01/05/2013   HCT 35.0* 01/05/2013   MCV 87.9 01/05/2013   PLT 255 01/05/2013    PATHOLOGY:  as above  ASSESSMENT    79 year old female with  #1 with DCIS of the right breast status post lumpectomy September 2014. Tumor was ER negative. She is proceeding with radiation therapy..  #2 because patient's tumor is ER negative we would not use antiestrogen therapy. But she will be followed clinically and radiographically.    PLAN:    #1 patient will finish up radiation therapy. Thereafter she will only be on observation from my perspective since her tumor was ER/PR negative.  #2 I will see her back in 6 months time for followup.         Thank you so much for allowing me to participate in the care of [name]. I will continue to follow up the patient with you and assist in her care.  All questions were answered. The patient knows to call the clinic with any problems, questions or concerns. We can certainly see the patient much sooner if necessary.  I spent 30 minutes counseling the patient face to face. The total time spent in the appointment was 30 minutes.  Drue Second, MD Medical/Oncology [facility] [phone] (beeper) [phone] (Office)  03/10/2013, 12:50 PM

## 2013-03-10 NOTE — Telephone Encounter (Signed)
appts made and printed...td 

## 2013-03-13 ENCOUNTER — Ambulatory Visit: Admission: RE | Admit: 2013-03-13 | Payer: Medicare Other | Source: Ambulatory Visit | Admitting: Radiation Oncology

## 2013-03-13 ENCOUNTER — Ambulatory Visit
Admission: RE | Admit: 2013-03-13 | Discharge: 2013-03-13 | Disposition: A | Discharge status: Home / Self Care | Payer: Medicare Other | Source: Ambulatory Visit | Attending: Radiation Oncology | Admitting: Radiation Oncology

## 2013-03-13 ENCOUNTER — Encounter: Payer: Self-pay | Admitting: Radiation Oncology

## 2013-03-13 DIAGNOSIS — L539 Erythematous condition, unspecified: Secondary | ICD-10-CM | POA: Diagnosis not present

## 2013-03-13 DIAGNOSIS — R5381 Other malaise: Secondary | ICD-10-CM | POA: Diagnosis not present

## 2013-03-13 DIAGNOSIS — C50919 Malignant neoplasm of unspecified site of unspecified female breast: Secondary | ICD-10-CM | POA: Diagnosis not present

## 2013-03-13 DIAGNOSIS — Z510 Encounter for antineoplastic radiation therapy: Secondary | ICD-10-CM | POA: Diagnosis not present

## 2013-03-13 NOTE — Progress Notes (Signed)
Weekly Management Note:  Site: Right breast Current Dose:  1250  cGy Projected Dose: 4250  cGy  Narrative: The patient is seen today for routine under treatment assessment. CBCT/MVCT images/port films were reviewed. The chart was reviewed.   She is without complaints today except for mild left breast discomfort. She left today before I could examine her.  Physical Examination: There were no vitals filed for this visit..  Weight:  . Examination will be performed tomorrow.  Impression: Tolerating radiation therapy well.  Plan: Continue radiation therapy as planned.

## 2013-03-14 ENCOUNTER — Other Ambulatory Visit: Payer: Self-pay | Admitting: *Deleted

## 2013-03-14 ENCOUNTER — Ambulatory Visit
Admission: RE | Admit: 2013-03-14 | Discharge: 2013-03-14 | Disposition: A | Discharge status: Home / Self Care | Payer: Medicare Other | Source: Ambulatory Visit | Attending: Radiation Oncology | Admitting: Radiation Oncology

## 2013-03-14 ENCOUNTER — Telehealth: Payer: Self-pay | Admitting: Internal Medicine

## 2013-03-14 DIAGNOSIS — Z510 Encounter for antineoplastic radiation therapy: Secondary | ICD-10-CM | POA: Diagnosis not present

## 2013-03-14 DIAGNOSIS — C50919 Malignant neoplasm of unspecified site of unspecified female breast: Secondary | ICD-10-CM | POA: Diagnosis not present

## 2013-03-14 DIAGNOSIS — L539 Erythematous condition, unspecified: Secondary | ICD-10-CM | POA: Diagnosis not present

## 2013-03-14 DIAGNOSIS — C50111 Malignant neoplasm of central portion of right female breast: Secondary | ICD-10-CM

## 2013-03-14 DIAGNOSIS — R5381 Other malaise: Secondary | ICD-10-CM | POA: Diagnosis not present

## 2013-03-14 DIAGNOSIS — C50119 Malignant neoplasm of central portion of unspecified female breast: Secondary | ICD-10-CM | POA: Diagnosis not present

## 2013-03-14 MED ORDER — CYANOCOBALAMIN 1000 MCG/ML IJ SOLN: 1000.0000 ug | INTRAMUSCULAR | Status: DC

## 2013-03-14 MED ORDER — "NEEDLE (DISP) 23G X 1"" MISC": 1.0000 mL | Status: DC

## 2013-03-14 NOTE — Progress Notes (Signed)
Weekly Management Note:  Site: Right breast Current Dose:  1500  cGy Projected Dose: 4250  cGy  Narrative: The patient is seen today for routine under treatment assessment. CBCT/MVCT images/port films were reviewed. The chart was reviewed.   Yesterday she was complaining of left breast discomfort but this has resolved. No complaints today.  Physical Examination: There were no vitals filed for this visit..  Weight:  . No significant skin changes along the right breast.  Impression: Tolerating radiation therapy well.  Plan: Continue radiation therapy as planned.

## 2013-03-14 NOTE — Progress Notes (Signed)
Pt seen in treatment area by Dr Dayton Scrape; no nurse evaluation completed.

## 2013-03-14 NOTE — Telephone Encounter (Signed)
Pt needs [facility]xs [facility] Vit b12(med) injections and needles. Pt would like enough for 90 day supply

## 2013-03-14 NOTE — Telephone Encounter (Signed)
done

## 2013-03-15 ENCOUNTER — Ambulatory Visit
Admission: RE | Admit: 2013-03-15 | Discharge: 2013-03-15 | Disposition: A | Discharge status: Home / Self Care | Payer: Medicare Other | Source: Ambulatory Visit | Attending: Radiation Oncology | Admitting: Radiation Oncology

## 2013-03-15 DIAGNOSIS — R5381 Other malaise: Secondary | ICD-10-CM | POA: Diagnosis not present

## 2013-03-15 DIAGNOSIS — Z510 Encounter for antineoplastic radiation therapy: Secondary | ICD-10-CM | POA: Diagnosis not present

## 2013-03-15 DIAGNOSIS — C50919 Malignant neoplasm of unspecified site of unspecified female breast: Secondary | ICD-10-CM | POA: Diagnosis not present

## 2013-03-15 DIAGNOSIS — L539 Erythematous condition, unspecified: Secondary | ICD-10-CM | POA: Diagnosis not present

## 2013-03-16 ENCOUNTER — Ambulatory Visit
Admission: RE | Admit: 2013-03-16 | Discharge: 2013-03-16 | Disposition: A | Discharge status: Home / Self Care | Payer: Medicare Other | Source: Ambulatory Visit | Attending: Radiation Oncology | Admitting: Radiation Oncology

## 2013-03-16 DIAGNOSIS — Z510 Encounter for antineoplastic radiation therapy: Secondary | ICD-10-CM | POA: Diagnosis not present

## 2013-03-16 DIAGNOSIS — C50919 Malignant neoplasm of unspecified site of unspecified female breast: Secondary | ICD-10-CM | POA: Diagnosis not present

## 2013-03-16 DIAGNOSIS — R5381 Other malaise: Secondary | ICD-10-CM | POA: Diagnosis not present

## 2013-03-16 DIAGNOSIS — L539 Erythematous condition, unspecified: Secondary | ICD-10-CM | POA: Diagnosis not present

## 2013-03-17 ENCOUNTER — Ambulatory Visit
Admission: RE | Admit: 2013-03-17 | Discharge: 2013-03-17 | Disposition: A | Discharge status: Home / Self Care | Payer: Medicare Other | Source: Ambulatory Visit | Attending: Radiation Oncology | Admitting: Radiation Oncology

## 2013-03-17 DIAGNOSIS — C50919 Malignant neoplasm of unspecified site of unspecified female breast: Secondary | ICD-10-CM | POA: Diagnosis not present

## 2013-03-17 DIAGNOSIS — Z510 Encounter for antineoplastic radiation therapy: Secondary | ICD-10-CM | POA: Diagnosis not present

## 2013-03-17 DIAGNOSIS — R5381 Other malaise: Secondary | ICD-10-CM | POA: Diagnosis not present

## 2013-03-17 DIAGNOSIS — L539 Erythematous condition, unspecified: Secondary | ICD-10-CM | POA: Diagnosis not present

## 2013-03-20 ENCOUNTER — Ambulatory Visit
Admission: RE | Admit: 2013-03-20 | Discharge: 2013-03-20 | Disposition: A | Discharge status: Home / Self Care | Payer: Medicare Other | Source: Ambulatory Visit | Attending: Radiation Oncology | Admitting: Radiation Oncology

## 2013-03-20 ENCOUNTER — Encounter: Payer: Self-pay | Admitting: *Deleted

## 2013-03-20 ENCOUNTER — Telehealth: Payer: Self-pay | Admitting: Internal Medicine

## 2013-03-20 VITALS — BP 135/83 | HR 88 | Temp 99.0°F | Resp 20 | Wt 141.4 lb

## 2013-03-20 DIAGNOSIS — Z510 Encounter for antineoplastic radiation therapy: Secondary | ICD-10-CM | POA: Diagnosis not present

## 2013-03-20 DIAGNOSIS — L539 Erythematous condition, unspecified: Secondary | ICD-10-CM | POA: Diagnosis not present

## 2013-03-20 DIAGNOSIS — C50919 Malignant neoplasm of unspecified site of unspecified female breast: Secondary | ICD-10-CM | POA: Diagnosis not present

## 2013-03-20 DIAGNOSIS — R5381 Other malaise: Secondary | ICD-10-CM | POA: Diagnosis not present

## 2013-03-20 DIAGNOSIS — C50111 Malignant neoplasm of central portion of right female breast: Secondary | ICD-10-CM

## 2013-03-20 NOTE — Progress Notes (Signed)
Pt denies pain, fatigue, loss of appetite. She is applying Radiaplex to right breast treatment area. 

## 2013-03-20 NOTE — Telephone Encounter (Signed)
Patient Information:  Caller Name: [name]  Phone: [phone]  Patient: [name], [name]  Gender: Female  DOB: [birth year]  Age: 79 Years  PCP: [name] (Adults only)  Office Follow Up:  Does the office need to follow up with this patient?: No  Instructions For The Office: [name]   Symptoms  Reason For Call & Symptoms: [name]/Brother calling that pt has fallen and has been confused.  He is trying to find out if she has an appt with Dr. Lovell [name] this week.  Reviewed Health History In EMR: [name]  Reviewed Medications In EMR: [name]  Reviewed Allergies In EMR: [name]  Reviewed Surgeries / Procedures: [name]  Date of Onset of Symptoms: Unknown  Guideline(s) Used:  No Protocol Available - Information Only  Disposition Per Guideline:   Home Care  Reason For Disposition Reached:   Information only question and nurse able to answer  Advice Given:  Call Back If:  New symptoms develop  You become worse.  RN Overrode Recommendation:  Make Appointment  Trying to find out if pt has an appt with Dr. Lovell [name] this week.  Pt does not have [name] F/U appt with Dr/ [name] until 07/2013.  Instructed [name]/Brother who is not her POA to try and get some more information about her confusion and call us back to schedule an appt.

## 2013-03-20 NOTE — Progress Notes (Signed)
Weekly Management Note:  Site: Right breast Current Dose:  2500  cGy Projected Dose: 4250  cGy  Narrative: The patient is seen today for routine under treatment assessment. CBCT/MVCT images/port films were reviewed. The chart was reviewed.   No complaints today. She has Radioplex gel to use when necessary.  Physical Examination:  Filed Vitals:   03/20/13 1440  BP: 135/83  Pulse: 88  Temp: 99 F (37.2 C)  Resp: 20  .  Weight: 141 lb 6.4 oz (64.139 kg). Faint erythema the skin with no areas of desquamation.  Impression: Tolerating radiation therapy well.  Plan: Continue radiation therapy as planned.

## 2013-03-20 NOTE — Progress Notes (Signed)
Mailed after appt letter to pt. 

## 2013-03-21 ENCOUNTER — Ambulatory Visit
Admission: RE | Admit: 2013-03-21 | Discharge: 2013-03-21 | Disposition: A | Discharge status: Home / Self Care | Payer: Medicare Other | Source: Ambulatory Visit | Attending: Radiation Oncology | Admitting: Radiation Oncology

## 2013-03-21 DIAGNOSIS — Z510 Encounter for antineoplastic radiation therapy: Secondary | ICD-10-CM | POA: Diagnosis not present

## 2013-03-21 DIAGNOSIS — C50919 Malignant neoplasm of unspecified site of unspecified female breast: Secondary | ICD-10-CM | POA: Diagnosis not present

## 2013-03-21 DIAGNOSIS — L539 Erythematous condition, unspecified: Secondary | ICD-10-CM | POA: Diagnosis not present

## 2013-03-21 DIAGNOSIS — R5381 Other malaise: Secondary | ICD-10-CM | POA: Diagnosis not present

## 2013-03-21 DIAGNOSIS — C50119 Malignant neoplasm of central portion of unspecified female breast: Secondary | ICD-10-CM | POA: Diagnosis not present

## 2013-03-22 ENCOUNTER — Ambulatory Visit
Admission: RE | Admit: 2013-03-22 | Discharge: 2013-03-22 | Disposition: A | Discharge status: Home / Self Care | Payer: Medicare Other | Source: Ambulatory Visit | Attending: Radiation Oncology | Admitting: Radiation Oncology

## 2013-03-22 DIAGNOSIS — C50919 Malignant neoplasm of unspecified site of unspecified female breast: Secondary | ICD-10-CM | POA: Diagnosis not present

## 2013-03-22 DIAGNOSIS — R5381 Other malaise: Secondary | ICD-10-CM | POA: Diagnosis not present

## 2013-03-22 DIAGNOSIS — Z510 Encounter for antineoplastic radiation therapy: Secondary | ICD-10-CM | POA: Diagnosis not present

## 2013-03-22 DIAGNOSIS — L539 Erythematous condition, unspecified: Secondary | ICD-10-CM | POA: Diagnosis not present

## 2013-03-23 ENCOUNTER — Ambulatory Visit
Admission: RE | Admit: 2013-03-23 | Discharge: 2013-03-23 | Disposition: A | Discharge status: Home / Self Care | Payer: Medicare Other | Source: Ambulatory Visit | Attending: Radiation Oncology | Admitting: Radiation Oncology

## 2013-03-23 DIAGNOSIS — R5381 Other malaise: Secondary | ICD-10-CM | POA: Diagnosis not present

## 2013-03-23 DIAGNOSIS — C50919 Malignant neoplasm of unspecified site of unspecified female breast: Secondary | ICD-10-CM | POA: Diagnosis not present

## 2013-03-23 DIAGNOSIS — Z510 Encounter for antineoplastic radiation therapy: Secondary | ICD-10-CM | POA: Diagnosis not present

## 2013-03-23 DIAGNOSIS — L539 Erythematous condition, unspecified: Secondary | ICD-10-CM | POA: Diagnosis not present

## 2013-03-24 ENCOUNTER — Ambulatory Visit
Admission: RE | Admit: 2013-03-24 | Discharge: 2013-03-24 | Disposition: A | Discharge status: Home / Self Care | Payer: Medicare Other | Source: Ambulatory Visit | Attending: Radiation Oncology | Admitting: Radiation Oncology

## 2013-03-24 DIAGNOSIS — L539 Erythematous condition, unspecified: Secondary | ICD-10-CM | POA: Diagnosis not present

## 2013-03-24 DIAGNOSIS — R5381 Other malaise: Secondary | ICD-10-CM | POA: Diagnosis not present

## 2013-03-24 DIAGNOSIS — C50919 Malignant neoplasm of unspecified site of unspecified female breast: Secondary | ICD-10-CM | POA: Diagnosis not present

## 2013-03-24 DIAGNOSIS — Z510 Encounter for antineoplastic radiation therapy: Secondary | ICD-10-CM | POA: Diagnosis not present

## 2013-03-27 ENCOUNTER — Ambulatory Visit
Admission: RE | Admit: 2013-03-27 | Discharge: 2013-03-27 | Disposition: A | Discharge status: Home / Self Care | Payer: Medicare Other | Source: Ambulatory Visit | Attending: Radiation Oncology | Admitting: Radiation Oncology

## 2013-03-27 ENCOUNTER — Encounter: Payer: Self-pay | Admitting: Radiation Oncology

## 2013-03-27 VITALS — BP 137/85 | HR 85 | Temp 98.2°F | Resp 20 | Wt 141.8 lb

## 2013-03-27 DIAGNOSIS — C50111 Malignant neoplasm of central portion of right female breast: Secondary | ICD-10-CM

## 2013-03-27 DIAGNOSIS — C50919 Malignant neoplasm of unspecified site of unspecified female breast: Secondary | ICD-10-CM | POA: Diagnosis not present

## 2013-03-27 DIAGNOSIS — L539 Erythematous condition, unspecified: Secondary | ICD-10-CM | POA: Diagnosis not present

## 2013-03-27 DIAGNOSIS — Z510 Encounter for antineoplastic radiation therapy: Secondary | ICD-10-CM | POA: Diagnosis not present

## 2013-03-27 DIAGNOSIS — R5381 Other malaise: Secondary | ICD-10-CM | POA: Diagnosis not present

## 2013-03-27 NOTE — Progress Notes (Signed)
Pt denies pain, loss of appetite. She is fatigued. She denies skin changes, irritation of her right breast.

## 2013-03-27 NOTE — Progress Notes (Signed)
Weekly Management Note:  Site: Right breast Current Dose:  3750  cGy Projected Dose: 4250  cGy  Narrative: The patient is seen today for routine under treatment assessment. CBCT/MVCT images/port films were reviewed. The chart was reviewed.   She is without complaints today except for fatigue. She has Radioplex gel to use when necessary.  Physical Examination:  Filed Vitals:   03/27/13 1406  BP: 137/85  Pulse: 85  Temp: 98.2 F (36.8 C)  Resp: 20  .  Weight: 141 lb 12.8 oz (64.32 kg). There is mild erythema the skin along the right breast with no areas of desquamation.  Impression: Tolerating radiation therapy well.  Plan: Continue radiation therapy as planned. She will finish radiation therapy this Wednesday and then return here for a followup visit in one month.

## 2013-03-28 ENCOUNTER — Ambulatory Visit
Admission: RE | Admit: 2013-03-28 | Discharge: 2013-03-28 | Disposition: A | Discharge status: Home / Self Care | Payer: Medicare Other | Source: Ambulatory Visit | Attending: Radiation Oncology | Admitting: Radiation Oncology

## 2013-03-28 DIAGNOSIS — R5381 Other malaise: Secondary | ICD-10-CM | POA: Diagnosis not present

## 2013-03-28 DIAGNOSIS — Z510 Encounter for antineoplastic radiation therapy: Secondary | ICD-10-CM | POA: Diagnosis not present

## 2013-03-28 DIAGNOSIS — C50919 Malignant neoplasm of unspecified site of unspecified female breast: Secondary | ICD-10-CM | POA: Diagnosis not present

## 2013-03-28 DIAGNOSIS — L539 Erythematous condition, unspecified: Secondary | ICD-10-CM | POA: Diagnosis not present

## 2013-03-29 ENCOUNTER — Encounter: Payer: Self-pay | Admitting: Radiation Oncology

## 2013-03-29 ENCOUNTER — Ambulatory Visit
Admission: RE | Admit: 2013-03-29 | Discharge: 2013-03-29 | Disposition: A | Discharge status: Home / Self Care | Payer: Medicare Other | Source: Ambulatory Visit | Attending: Radiation Oncology | Admitting: Radiation Oncology

## 2013-03-29 DIAGNOSIS — C50919 Malignant neoplasm of unspecified site of unspecified female breast: Secondary | ICD-10-CM | POA: Diagnosis not present

## 2013-03-29 DIAGNOSIS — R5381 Other malaise: Secondary | ICD-10-CM | POA: Diagnosis not present

## 2013-03-29 DIAGNOSIS — L539 Erythematous condition, unspecified: Secondary | ICD-10-CM | POA: Diagnosis not present

## 2013-03-29 DIAGNOSIS — Z510 Encounter for antineoplastic radiation therapy: Secondary | ICD-10-CM | POA: Diagnosis not present

## 2013-03-29 NOTE — Progress Notes (Signed)
[facility] Radiation Oncology End of Treatment Note  Name:[name]  Date: 03/29/2013 ZOX:[id number] DOB:November 26, 1929   Status:outpatient    CC: Carrie Mew, MD  Dr. Jaclynn Guarneri  REFERRING PHYSICIAN:   Dr. 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                NARRATIVE: Ms. Butcher tolerated her treatment well with no significant region dermatitis by completion of therapy. She used Radioplex gel during her course of therapy. She did have moderate fatigue.                            PLAN: Routine followup in one month. Patient instructed to call if questions or worsening complaints in interim.

## 2013-04-13 DIAGNOSIS — M25579 Pain in unspecified ankle and joints of unspecified foot: Secondary | ICD-10-CM | POA: Diagnosis not present

## 2013-05-01 ENCOUNTER — Encounter: Payer: Self-pay | Admitting: *Deleted

## 2013-05-09 ENCOUNTER — Ambulatory Visit
Admission: RE | Admit: 2013-05-09 | Discharge: 2013-05-09 | Disposition: A | Discharge status: Home / Self Care | Payer: Medicare Other | Source: Ambulatory Visit | Attending: Radiation Oncology | Admitting: Radiation Oncology

## 2013-05-09 VITALS — BP 165/71 | HR 81 | Temp 98.4°F | Wt 139.5 lb

## 2013-05-09 DIAGNOSIS — C50111 Malignant neoplasm of central portion of right female breast: Secondary | ICD-10-CM

## 2013-05-09 NOTE — Progress Notes (Signed)
Patient here for routine one month follow up completion of radiation to right breast.Denies pain or skin problems.Main concern is continued fatigue.Patient under observation with Dr. Chancy Milroy  As pathology was ER-PR-to follow up in [place] 2015.

## 2013-05-09 NOTE — Progress Notes (Signed)
CC: Dr. Marcy Panning  Followup note:  [name] returns today approximately 5 weeks following completion of radiation therapy following conservative surgery in the management of her high-grade DCIS (receptor negative) of the right breast. She is without complaints today. She does not have an appointment to see Dr. Excell Seltzer, but she does have an appointment to see Dr. Humphrey Rolls for a followup visit on 09/11/2013.  Physical examination: Alert and oriented. Filed Vitals:   05/09/13 1155  BP: 165/71  Pulse: 81  Temp: 98.4 F (36.9 C)   Head and neck examination: Grossly unremarkable. Nodes: Without palpable cervical, supraclavicular, or axillary lymphadenopathy. Chest: Lungs clear. Breasts: There is minimal residual hyperpigmentation the skin along the right breast with minimal thickening. No masses are appreciated. Left breast without masses or lesions.  Impression: Satisfactory progress.  Plan: She see Dr. Humphrey Rolls on 09/11/2013. I explained her that she is to be followed by Dr. Humphrey Rolls, Dr. Excell Seltzer, or me.  She should have a baseline right breast mammogram no later than August 2015. She is in favor of being followed by Dr. Humphrey Rolls. She is to let me know if she changes her mind.

## 2013-06-01 ENCOUNTER — Other Ambulatory Visit: Payer: Self-pay | Admitting: Internal Medicine

## 2013-06-21 ENCOUNTER — Encounter (INDEPENDENT_AMBULATORY_CARE_PROVIDER_SITE_OTHER): Payer: Self-pay | Admitting: General Surgery

## 2013-07-10 ENCOUNTER — Ambulatory Visit: Payer: Medicare Other | Admitting: Internal Medicine

## 2013-07-10 DIAGNOSIS — Z0289 Encounter for other administrative examinations: Secondary | ICD-10-CM

## 2013-08-06 ENCOUNTER — Encounter (HOSPITAL_COMMUNITY): Payer: Self-pay | Admitting: Emergency Medicine

## 2013-08-06 ENCOUNTER — Emergency Department (HOSPITAL_COMMUNITY)
Admission: EM | Admit: 2013-08-06 | Discharge: 2013-08-06 | Disposition: A | Discharge status: Home / Self Care | Payer: Medicare Other | Attending: Emergency Medicine | Admitting: Emergency Medicine

## 2013-08-06 ENCOUNTER — Emergency Department (HOSPITAL_COMMUNITY): Payer: Medicare Other

## 2013-08-06 DIAGNOSIS — Z9849 Cataract extraction status, unspecified eye: Secondary | ICD-10-CM | POA: Diagnosis not present

## 2013-08-06 DIAGNOSIS — Z8739 Personal history of other diseases of the musculoskeletal system and connective tissue: Secondary | ICD-10-CM | POA: Insufficient documentation

## 2013-08-06 DIAGNOSIS — E669 Obesity, unspecified: Secondary | ICD-10-CM | POA: Diagnosis not present

## 2013-08-06 DIAGNOSIS — Z79899 Other long term (current) drug therapy: Secondary | ICD-10-CM | POA: Insufficient documentation

## 2013-08-06 DIAGNOSIS — G8929 Other chronic pain: Secondary | ICD-10-CM | POA: Insufficient documentation

## 2013-08-06 DIAGNOSIS — Z8781 Personal history of (healed) traumatic fracture: Secondary | ICD-10-CM | POA: Insufficient documentation

## 2013-08-06 DIAGNOSIS — Z923 Personal history of irradiation: Secondary | ICD-10-CM | POA: Diagnosis not present

## 2013-08-06 DIAGNOSIS — K219 Gastro-esophageal reflux disease without esophagitis: Secondary | ICD-10-CM | POA: Insufficient documentation

## 2013-08-06 DIAGNOSIS — F411 Generalized anxiety disorder: Secondary | ICD-10-CM | POA: Diagnosis not present

## 2013-08-06 DIAGNOSIS — Z9889 Other specified postprocedural states: Secondary | ICD-10-CM | POA: Insufficient documentation

## 2013-08-06 DIAGNOSIS — Z880 Allergy status to penicillin: Secondary | ICD-10-CM | POA: Diagnosis not present

## 2013-08-06 DIAGNOSIS — E119 Type 2 diabetes mellitus without complications: Secondary | ICD-10-CM | POA: Insufficient documentation

## 2013-08-06 DIAGNOSIS — I10 Essential (primary) hypertension: Secondary | ICD-10-CM | POA: Diagnosis not present

## 2013-08-06 DIAGNOSIS — Z860100 Personal history of colon polyps, unspecified: Secondary | ICD-10-CM | POA: Insufficient documentation

## 2013-08-06 DIAGNOSIS — R55 Syncope and collapse: Secondary | ICD-10-CM | POA: Diagnosis not present

## 2013-08-06 DIAGNOSIS — Z791 Long term (current) use of non-steroidal anti-inflammatories (NSAID): Secondary | ICD-10-CM | POA: Diagnosis not present

## 2013-08-06 DIAGNOSIS — M1990 Unspecified osteoarthritis, unspecified site: Secondary | ICD-10-CM | POA: Insufficient documentation

## 2013-08-06 DIAGNOSIS — D649 Anemia, unspecified: Secondary | ICD-10-CM | POA: Diagnosis not present

## 2013-08-06 DIAGNOSIS — Z853 Personal history of malignant neoplasm of breast: Secondary | ICD-10-CM | POA: Insufficient documentation

## 2013-08-06 DIAGNOSIS — E538 Deficiency of other specified B group vitamins: Secondary | ICD-10-CM | POA: Diagnosis not present

## 2013-08-06 DIAGNOSIS — Z85820 Personal history of malignant melanoma of skin: Secondary | ICD-10-CM | POA: Diagnosis not present

## 2013-08-06 DIAGNOSIS — E785 Hyperlipidemia, unspecified: Secondary | ICD-10-CM | POA: Diagnosis not present

## 2013-08-06 LAB — CBC WITH DIFFERENTIAL/PLATELET
Basophils Absolute: 0 10*3/uL (ref 0.0–0.1)
Basophils Relative: 0 % (ref 0–1)
Eosinophils Absolute: 0 10*3/uL (ref 0.0–0.7)
Eosinophils Relative: 1 % (ref 0–5)
HCT: 36.3 % (ref 36.0–46.0)
Hemoglobin: 12.3 g/dL (ref 12.0–15.0)
Lymphocytes Relative: 24 % (ref 12–46)
Lymphs Abs: 1.4 10*3/uL (ref 0.7–4.0)
MCH: 29.9 pg (ref 26.0–34.0)
MCHC: 33.9 g/dL (ref 30.0–36.0)
MCV: 88.1 fL (ref 78.0–100.0)
Monocytes Absolute: 0.6 10*3/uL (ref 0.1–1.0)
Monocytes Relative: 9 % (ref 3–12)
Neutro Abs: 3.9 10*3/uL (ref 1.7–7.7)
Neutrophils Relative %: 66 % (ref 43–77)
Platelets: 261 10*3/uL (ref 150–400)
RBC: 4.12 MIL/uL (ref 3.87–5.11)
RDW: 13 % (ref 11.5–15.5)
WBC: 6 10*3/uL (ref 4.0–10.5)

## 2013-08-06 LAB — COMPREHENSIVE METABOLIC PANEL
ALT: 12 U/L (ref 0–35)
AST: 16 U/L (ref 0–37)
Albumin: 3.7 g/dL (ref 3.5–5.2)
Alkaline Phosphatase: 57 U/L (ref 39–117)
BUN: 11 mg/dL (ref 6–23)
CO2: 24 mEq/L (ref 19–32)
Calcium: 9.5 mg/dL (ref 8.4–10.5)
Chloride: 96 mEq/L (ref 96–112)
Creatinine, Ser: 0.77 mg/dL (ref 0.50–1.10)
GFR calc Af Amer: 89 mL/min — ABNORMAL LOW (ref >90)
GFR calc non Af Amer: 77 mL/min — ABNORMAL LOW (ref >90)
Glucose, Bld: 201 mg/dL — ABNORMAL HIGH (ref 70–99)
Potassium: 3.9 mEq/L (ref 3.7–5.3)
Sodium: 135 mEq/L — ABNORMAL LOW (ref 137–147)
Total Bilirubin: 0.3 mg/dL (ref 0.3–1.2)
Total Protein: 6.6 g/dL (ref 6.0–8.3)

## 2013-08-06 LAB — I-STAT CHEM 8, ED
BUN: 10 mg/dL (ref 6–23)
Calcium, Ion: 1.22 mmol/L (ref 1.13–1.30)
Chloride: 97 mEq/L (ref 96–112)
Creatinine, Ser: 0.9 mg/dL (ref 0.50–1.10)
Glucose, Bld: 199 mg/dL — ABNORMAL HIGH (ref 70–99)
HCT: 38 % (ref 36.0–46.0)
Hemoglobin: 12.9 g/dL (ref 12.0–15.0)
Potassium: 3.8 mEq/L (ref 3.7–5.3)
Sodium: 137 mEq/L (ref 137–147)
TCO2: 25 mmol/L (ref 0–100)

## 2013-08-06 LAB — TROPONIN I: Troponin I: <0.3 ng/mL (ref <0.30)

## 2013-08-06 LAB — CBG MONITORING, ED: Glucose-Capillary: 182 mg/dL — ABNORMAL HIGH (ref 70–99)

## 2013-08-06 MED ORDER — SODIUM CHLORIDE 0.9 % IV SOLN
INTRAVENOUS | Status: DC
  Administered 2013-08-06: 20 mL/h via INTRAVENOUS

## 2013-08-06 NOTE — ED Notes (Signed)
Pt ambulating independently w/ steady gait on d/c in no acute distress, A&Ox4.D/c instructions reviewed w/ pt and family - pt and family deny any further questions or concerns at present.  

## 2013-08-06 NOTE — ED Notes (Signed)
MD Allen at bedside.

## 2013-08-06 NOTE — ED Notes (Signed)
Pt from home c/o of a syncopal episode that family reports lasted for a few minutes. Negative stroke screen. Family reports that this has happen before and it was hypoglycemia. Pt alert oriented and talking clearly at this time.

## 2013-08-06 NOTE — ED Provider Notes (Signed)
CSN: [id number]     Arrival date & time 08/06/13  1815 History   First MD Initiated Contact with Patient 08/06/13 1851     Chief Complaint  Patient presents with  . Loss of Consciousness     (Consider location/radiation/quality/duration/timing/severity/associated sxs/prior Treatment) Patient is a 80 y.o. female presenting with syncope. The history is provided by the patient.  Loss of Consciousness  patient here complaining of syncopal event while sitting down which occurred just prior to arrival. This is at least the patient's second episode of this. She denied a symptoms prior to the event. She denied having chest pain or headache. She was not short of breath. No abdominal pain. No recent vomiting or diarrhea. According to her daughter-in-law, who is a Marine scientist, did so last for possibly 2 minutes. There was no seizure activity. There is no postictal period. Patient not having any palpitations before the incident. She now feels at her baseline. No new medications used recently.  Past Medical History  Diagnosis Date  . Seasonal allergies   . HTN (hypertension)   . Osteoarthritis   . Palpitations   . Anemia   . History of colonic polyps   . GERD (gastroesophageal reflux disease)   . Hyperlipidemia   . Chronic facial pain   . Vitamin B12 deficiency   . Dyslipidemia   . Chronic foot pain   . Chronic cough   . Obesity   . Anxiety   . Skin cancer   . Complication of anesthesia     Sore jaw; could not chew or move mouth  . Diabetes mellitus     type 2 niddm x 20 years  . Melanoma   . Metatarsal bone fracture 2014  . Hammer toe     bilateral  . Cancer of right breast 12/26/12    right breast 12:00 o'clock, DCIS  . Hx of radiation therapy 03/07/13- 03/29/13    right breast 4250 cGy 17 sessions   Past Surgical History  Procedure Laterality Date  . Colonoscopy    . Polypectomy      small adenomatous  . Abdominal hysterectomy    . Cholecystectomy    . Hernia repair    . Knee  arthroscopy Bilateral   . Ulnar tunnel release    . Eye surgery    . Cataract extraction w/ intraocular lens implant Right   . Bunionectomy    . Breast lumpectomy with needle localization Right 01/17/2013    Procedure: BREAST LUMPECTOMY WITH NEEDLE LOCALIZATION;  Surgeon: Edward Jolly, MD;  Location: [place];  Service: General;  Laterality: Right;  . Breast biopsy Right 01/24/2013    Procedure: RE-EXCICION OF BREAST CANCER, ANTERIOR MARGINS;  Surgeon: Edward Jolly, MD;  Location: [facility];  Service: General;  Laterality: Right;   Family History  Problem Relation Age of Onset  . Colon cancer    . Heart disease Mother   . Diabetes Father   . Pancreatic cancer Father   . Bone cancer Sister   . Prostate cancer Brother   . Colon cancer Brother   . Rectal cancer Sister    History  Substance Use Topics  . Smoking status: Never Smoker   . Smokeless tobacco: Not on file  . Alcohol Use: No   OB History   Grav Para Term Preterm Abortions TAB SAB Ect Mult Living                 Review of Systems  Cardiovascular: Positive for syncope.  All other systems reviewed and are negative.      Allergies  Sular; Actonel; Amlodipine besylate; Aspirin; Atacand; Bextra; Bisoprolol-hydrochlorothiazide; Bystolic; Candesartan cilexetil; Carbamazepine; Cefadroxil; Cefdinir; Celecoxib; Cholestyramine; Clonidine; Clonidine hydrochloride; Codeine; Ezetimibe; Hydrazine yellow; Hydrocodone; Hydrocodone-acetaminophen; Meloxicam; Methylprednisolone sodium succinate; Morphine; Nabumetone; Naproxen; Niacin; Niaspan; Nisoldipine; Norvasc; Optivar; Penicillins; Pseudoephedrine-guaifenesin; Risedronate sodium; Rofecoxib; Ru-tuss; Sulfonamide derivatives; [place]; Telithromycin; Telmisartan; Terfenadine; Trandolapril-verapamil hcl er; Ziac; Iodinated diagnostic agents; and Valium  Home Medications   Current Outpatient Rx  Name  Route  Sig  Dispense  Refill  . acetaminophen (TYLENOL) 500 MG tablet    Oral   Take 1,000 mg by mouth every 6 (six) hours as needed for pain.         Marland Kitchen ALPRAZolam (XANAX) 0.25 MG tablet   Oral   Take 0.25 mg by mouth at bedtime as needed for anxiety.         . benzonatate (TESSALON) 100 MG capsule   Oral   Take 100 mg by mouth daily as needed.         . calcium carbonate (OS-CAL) 600 MG TABS   Oral   Take 600 mg by mouth 2 (two) times daily with a meal.          . cholecalciferol (VITAMIN D) 1000 UNITS tablet   Oral   Take 1,000 Units by mouth daily at 6 (six) AM.          . colestipol (COLESTID) 1 G tablet   Oral   Take 2 g by mouth 2 (two) times daily.         . cyanocobalamin (,VITAMIN B-12,) 1000 MCG/ML injection   Intramuscular   Inject 1 mL (1,000 mcg total) into the muscle every 21 ( twenty-one) days.   10 mL   3   . CYANOCOBALAMIN IJ   Injection   Inject 1 mL as directed every 21 ( twenty-one) days. Vitamin B-12 1000 mcg-ml injection.         . Diclofenac Sodium 3 % GEL   Transdermal   Place 1 application onto the skin 2 (two) times daily.         . ferrous sulfate 325 (65 FE) MG tablet   Oral   Take 325 mg by mouth daily with breakfast.          . fexofenadine (ALLEGRA) 60 MG tablet   Oral   Take 60 mg by mouth daily.          . fish oil-omega-3 fatty acids 1000 MG capsule   Oral   Take 1 g by mouth 2 (two) times daily.          . hyaluronate sodium (RADIAPLEXRX) GEL   Topical   Apply 1 application topically 2 (two) times daily.         Marland Kitchen lidocaine (LIDODERM) 5 %   Transdermal   Place 1 patch onto the skin daily. Remove & Discard patch within 12 hours or as directed by MD         . nateglinide (STARLIX) 120 MG tablet   Oral   Take 120 mg by mouth 3 (three) times daily before meals.         Marland Kitchen NEEDLE, DISP, 23 G 23G X 1" MISC   Does not apply   1 mL by Does not apply route every 21 ( twenty-one) days.   90 each   1   . polyvinyl alcohol (LIQUIFILM TEARS) 1.4 % ophthalmic solution    Both Eyes  Place 1 drop into both eyes as needed (dry eyes).         . ranitidine (ZANTAC) 150 MG capsule   Oral   Take 150 mg by mouth 2 (two) times daily.          . SitaGLIPtin-MetFORMIN HCl (JANUMET XR) [phone] MG TB24   Oral   Take 1 tablet by mouth daily.   90 tablet   3   . STARLIX 120 MG tablet      TAKE 1 TABLET THREE TIMES A DAY BEFORE MEALS   270 tablet   3   . TARKA 2-180 MG per tablet      TAKE 1 TABLET DAILY   90 tablet   2   . traMADol (ULTRAM) 50 MG tablet   Oral   Take 1-2 tablets (50-100 mg total) by mouth every 6 (six) hours as needed for pain.   20 tablet   1   . trandolapril-verapamil (TARKA) 2-180 MG per tablet   Oral   Take 1 tablet by mouth daily.          BP 176/82  Pulse 80  Temp(Src) 99.1 F (37.3 C) (Oral)  Resp 16  SpO2 97% Physical Exam  Nursing note and vitals reviewed. Constitutional: She is oriented to person, place, and time. She appears well-developed and well-nourished.  Non-toxic appearance. No distress.  HENT:  Head: Normocephalic and atraumatic.  Eyes: Conjunctivae, EOM and lids are normal. Pupils are equal, round, and reactive to light.  Neck: Normal range of motion. Neck supple. No tracheal deviation present. No mass present.  Cardiovascular: Normal rate, regular rhythm and normal heart sounds.  Exam reveals no gallop.   No murmur heard. Pulmonary/Chest: Effort normal and breath sounds normal. No stridor. No respiratory distress. She has no decreased breath sounds. She has no wheezes. She has no rhonchi. She has no rales.  Abdominal: Soft. Normal appearance and bowel sounds are normal. She exhibits no distension. There is no tenderness. There is no rebound and no CVA tenderness.  Musculoskeletal: Normal range of motion. She exhibits no edema and no tenderness.  Neurological: She is alert and oriented to person, place, and time. She has normal strength. No cranial nerve deficit or sensory deficit. GCS eye subscore  is 4. GCS verbal subscore is 5. GCS motor subscore is 6.  Skin: Skin is warm and dry. No abrasion and no rash noted.  Psychiatric: She has a normal mood and affect. Her speech is normal and behavior is normal.    ED Course  Procedures (including critical care time) Labs Review Labs Reviewed  CBG MONITORING, ED - Abnormal; Notable for the following:    Glucose-Capillary 182 (*)    All other components within normal limits  I-STAT CHEM 8, ED - Abnormal; Notable for the following:    Glucose, Bld 199 (*)    All other components within normal limits  TROPONIN I  CBC WITH DIFFERENTIAL  COMPREHENSIVE METABOLIC PANEL   Imaging Review No results found.   EKG Interpretation   Date/Time:  Sunday August 06 2013 19:07:29 EDT Ventricular Rate:  76 PR Interval:  192 QRS Duration: 95 QT Interval:  441 QTC Calculation: 496 R Axis:   -41 Text Interpretation:  Sinus rhythm Inferior infarct, old Lead(s) V6 were  not used for morphology analysis Confirmed by Zenia Resides  MD, Colin Ellers ([id number])  on 08/06/2013 7:09:28 PM      MDM   Final diagnoses:  None   Patient offered admission for  evaluation of her syncope which she has declined at this time. I spent an extended period of time trying to convince her to stay in the hospital in front of her family but she is adamant that she likely the home. Risk of sudden cardiac death explained to the patient and she accepts this. She has capacity to make this decision. She is strongly encouraged to return if she changes her mind or if she has another syncopal event.     Leota Jacobsen, MD 08/06/13 [phone]

## 2013-08-06 NOTE — ED Notes (Signed)
CBG registered 182 on ED Glucometer

## 2013-08-06 NOTE — Discharge Instructions (Signed)
You were offered admission for evaluation of why you are passing out and you have declined at this point. Please followup with your Dr. next week or return here if you should pass out again or if you change your mind about being admitted to the hospital Syncope Syncope is a fainting spell. This means the person loses consciousness and drops to the ground. The person is generally unconscious for less than 5 minutes. The person [place] have some muscle twitches for up to 15 seconds before waking up and returning to normal. Syncope occurs more often in elderly people, but it can happen to anyone. While most causes of syncope are not dangerous, syncope can be a sign of a serious medical problem. It is important to seek medical care.  CAUSES  Syncope is caused by a sudden decrease in blood flow to the brain. The specific cause is often not determined. Factors that can trigger syncope include:  Taking medicines that lower blood pressure.  Sudden changes in posture, such as standing up suddenly.  Taking more medicine than prescribed.  Standing in one place for too long.  Seizure disorders.  Dehydration and excessive exposure to heat.  Low blood sugar (hypoglycemia).  Straining to have a bowel movement.  Heart disease, irregular heartbeat, or other circulatory problems.  Fear, emotional distress, seeing blood, or severe pain. SYMPTOMS  Right before fainting, you [place]:  Feel dizzy or lightheaded.  Feel nauseous.  See all white or all black in your field of vision.  Have cold, clammy skin. DIAGNOSIS  Your caregiver will ask about your symptoms, perform a physical exam, and perform electrocardiography (ECG) to record the electrical activity of your heart. Your caregiver Gaza also perform other heart or blood tests to determine the cause of your syncope. TREATMENT  In most cases, no treatment is needed. Depending on the cause of your syncope, your caregiver Helbert recommend changing or stopping some  of your medicines. HOME CARE INSTRUCTIONS  Have someone stay with you until you feel stable.  Do not drive, operate machinery, or play sports until your caregiver says it is okay.  Keep all follow-up appointments as directed by your caregiver.  Lie down right away if you start feeling like you might faint. Breathe deeply and steadily. Wait until all the symptoms have passed.  Drink enough fluids to keep your urine clear or pale yellow.  If you are taking blood pressure or heart medicine, get up slowly, taking several minutes to sit and then stand. This can reduce dizziness. SEEK IMMEDIATE MEDICAL CARE IF:   You have a severe headache.  You have unusual pain in the chest, abdomen, or back.  You are bleeding from the mouth or rectum, or you have black or tarry stool.  You have an irregular or very fast heartbeat.  You have pain with breathing.  You have repeated fainting or seizure-like jerking during an episode.  You faint when sitting or lying down.  You have confusion.  You have difficulty walking.  You have severe weakness.  You have vision problems. If you fainted, call your local emergency services (911 in [place] Do not drive yourself to the hospital.  MAKE SURE YOU:  Understand these instructions.  Will watch your condition.  Will get help right away if you are not doing well or get worse. Document Released: 04/20/2005 Document Revised: 10/20/2011 Document Reviewed: 06/19/2011 [facility] Patient Information 2014 [place].

## 2013-08-07 ENCOUNTER — Encounter: Payer: Self-pay | Admitting: Family

## 2013-08-07 ENCOUNTER — Telehealth: Payer: Self-pay | Admitting: Internal Medicine

## 2013-08-07 ENCOUNTER — Ambulatory Visit (INDEPENDENT_AMBULATORY_CARE_PROVIDER_SITE_OTHER): Payer: Medicare Other | Admitting: Family

## 2013-08-07 VITALS — BP 140/62 | HR 93 | Temp 98.3°F | Ht 67.5 in | Wt 142.0 lb

## 2013-08-07 DIAGNOSIS — R55 Syncope and collapse: Secondary | ICD-10-CM

## 2013-08-07 DIAGNOSIS — K219 Gastro-esophageal reflux disease without esophagitis: Secondary | ICD-10-CM | POA: Diagnosis not present

## 2013-08-07 DIAGNOSIS — E119 Type 2 diabetes mellitus without complications: Secondary | ICD-10-CM | POA: Diagnosis not present

## 2013-08-07 MED ORDER — LIDOCAINE 5 % EX PTCH: 1.0000 | MEDICATED_PATCH | CUTANEOUS | Status: DC

## 2013-08-07 NOTE — Telephone Encounter (Signed)
Noted pt coming to see [facility]

## 2013-08-07 NOTE — Telephone Encounter (Signed)
Patient Information:  Caller Name: [facility]  Phone: [phone]  Patient: [name], [name]  Gender: Female  DOB: 19-Apr-1930  Age: 80 Years  PCP: Benay Pillow (Adults only)  Office Follow Up:  Does the office need to follow up with this patient?: No  Instructions For The Office: N/A   Symptoms  Reason For Call & Symptoms: Pt is calling and states that she blacked out on 08/06/13 and was seen at [facility] ED and was instructed to see PCP this week;  ED wanted pt to stay for observation overnight but pt declined; denies any sx today; having trouble remembering things over the last couple months following anethesia  Reviewed Health History In EMR: Yes  Reviewed Medications In EMR: Yes  Reviewed Allergies In EMR: Yes  Reviewed Surgeries / Procedures: Yes  Date of Onset of Symptoms: 08/06/2013  Guideline(s) Used:  No Protocol Available - Sick Adult  Disposition Per Guideline:   See Today in Office  Reason For Disposition Reached:   Nursing judgment  Advice Given:  Call Back If:  New symptoms develop  Patient Will Follow Care Advice:  YES  Appointment Scheduled:  08/07/2013 15:30:00 Appointment Scheduled Provider:  Roxy Cedar (Family Practice)  Pt states that she will not see anyone else but Dr Arnoldo Morale. Pt states that she was told by Dr Arnoldo Morale if she is refused an appt with him, she is to come to the office and sit until he sees her.  Office called and made aware.  Dr Arnoldo Morale instructed that she needs to keep appt at 3:30pm and he will be there to see her with Megan Salon.  Pt not very happy with appt but states she will see what she can do. Triage RN reviewed the importance of being seen due to her sx; voices understanding

## 2013-08-07 NOTE — Progress Notes (Signed)
Pre visit review using our clinic review tool, if applicable. No additional management support is needed unless otherwise documented below in the visit note. 

## 2013-08-07 NOTE — Patient Instructions (Signed)
Syncope  Syncope is a fainting spell. This means the person loses consciousness and drops to the ground. The person is generally unconscious for less than 5 minutes. The person [place] have some muscle twitches for up to 15 seconds before waking up and returning to normal. Syncope occurs more often in elderly people, but it can happen to anyone. While most causes of syncope are not dangerous, syncope can be a sign of a serious medical problem. It is important to seek medical care.   CAUSES   Syncope is caused by a sudden decrease in blood flow to the brain. The specific cause is often not determined. Factors that can trigger syncope include:   Taking medicines that lower blood pressure.   Sudden changes in posture, such as standing up suddenly.   Taking more medicine than prescribed.   Standing in one place for too long.   Seizure disorders.   Dehydration and excessive exposure to heat.   Low blood sugar (hypoglycemia).   Straining to have a bowel movement.   Heart disease, irregular heartbeat, or other circulatory problems.   Fear, emotional distress, seeing blood, or severe pain.  SYMPTOMS   Right before fainting, you [place]:   Feel dizzy or lightheaded.   Feel nauseous.   See all white or all black in your field of vision.   Have cold, clammy skin.  DIAGNOSIS   Your caregiver will ask about your symptoms, perform a physical exam, and perform electrocardiography (ECG) to record the electrical activity of your heart. Your caregiver Solorio also perform other heart or blood tests to determine the cause of your syncope.  TREATMENT   In most cases, no treatment is needed. Depending on the cause of your syncope, your caregiver Brenn recommend changing or stopping some of your medicines.  HOME CARE INSTRUCTIONS   Have someone stay with you until you feel stable.   Do not drive, operate machinery, or play sports until your caregiver says it is okay.   Keep all follow-up appointments as directed by your  caregiver.   Lie down right away if you start feeling like you might faint. Breathe deeply and steadily. Wait until all the symptoms have passed.   Drink enough fluids to keep your urine clear or pale yellow.   If you are taking blood pressure or heart medicine, get up slowly, taking several minutes to sit and then stand. This can reduce dizziness.  SEEK IMMEDIATE MEDICAL CARE IF:    You have a severe headache.   You have unusual pain in the chest, abdomen, or back.   You are bleeding from the mouth or rectum, or you have black or tarry stool.   You have an irregular or very fast heartbeat.   You have pain with breathing.   You have repeated fainting or seizure-like jerking during an episode.   You faint when sitting or lying down.   You have confusion.   You have difficulty walking.   You have severe weakness.   You have vision problems.  If you fainted, call your local emergency services (911 in [place] Do not drive yourself to the hospital.   MAKE SURE YOU:   Understand these instructions.   Will watch your condition.   Will get help right away if you are not doing well or get worse.  Document Released: 04/20/2005 Document Revised: 10/20/2011 Document Reviewed: 06/19/2011  [employer] Patient Information 2014 [employer].

## 2013-08-08 ENCOUNTER — Telehealth: Payer: Self-pay | Admitting: Internal Medicine

## 2013-08-08 NOTE — Progress Notes (Signed)
Subjective: A     Patient ID: [name], female    DOB: 1929/09/13, 80 y.o.   MRN: [id number]  HPI 80 year old white female, nonsmoker, is in today after being evaluated in the ED yesterday for syncope witnessed by her daughter who is a Therapist, sports. Daughter reports she was out several seconds has had previous episodes similar to this about 5-6 times. Has not seen cardiology in several years. She was advised to stay overnight for observation the patient refused and left. They're requesting a cardiac referral. Reports occasionally having on and vomiting but has not taken her evening medications. Is unsure if she still taken Zantac or Prilosec for reflux. He has multiple allergies and believes that some of her medications are making her ill. Reports a vague history of a silent MI many years ago.    Review of Systems  Constitutional: Negative.   HENT: Negative.   Respiratory: Negative.   Cardiovascular: Negative.  Negative for chest pain, palpitations and leg swelling.  Gastrointestinal: Negative.   Endocrine: Negative.   Genitourinary: Negative.   Musculoskeletal: Negative.   Skin: Negative.   Allergic/Immunologic: Negative.   Neurological: Positive for dizziness and syncope. Negative for light-headedness and headaches.  Hematological: Negative.   Psychiatric/Behavioral: Negative.    Past Medical History  Diagnosis Date  . Seasonal allergies   . HTN (hypertension)   . Osteoarthritis   . Palpitations   . Anemia   . History of colonic polyps   . GERD (gastroesophageal reflux disease)   . Hyperlipidemia   . Chronic facial pain   . Vitamin B12 deficiency   . Dyslipidemia   . Chronic foot pain   . Chronic cough   . Obesity   . Anxiety   . Skin cancer   . Complication of anesthesia     Sore jaw; could not chew or move mouth  . Diabetes mellitus     type 2 niddm x 20 years  . Melanoma   . Metatarsal bone fracture 2014  . Hammer toe     bilateral  . Cancer of right breast 12/26/12   right breast 12:00 o'clock, DCIS  . Hx of radiation therapy 03/07/13- 03/29/13    right breast 4250 cGy 17 sessions    History   Social History  . Marital Status: Widowed    Spouse Name: N/A    Number of Children: N/A  . Years of Education: N/A   Occupational History  . retired    Social History Main Topics  . Smoking status: Never Smoker   . Smokeless tobacco: Not on file  . Alcohol Use: No  . Drug Use: No  . Sexual Activity: Yes     Comment: menarche age 63, hysterectomy age 78, HRT x 2-3 mos, G1- miscarriage   Other Topics Concern  . Not on file   Social History Narrative  . No narrative on file    Past Surgical History  Procedure Laterality Date  . Colonoscopy    . Polypectomy      small adenomatous  . Abdominal hysterectomy    . Cholecystectomy    . Hernia repair    . Knee arthroscopy Bilateral   . Ulnar tunnel release    . Eye surgery    . Cataract extraction w/ intraocular lens implant Right   . Bunionectomy    . Breast lumpectomy with needle localization Right 01/17/2013    Procedure: BREAST LUMPECTOMY WITH NEEDLE LOCALIZATION;  Surgeon: Edward Jolly, MD;  Location: [facility];  Service: General;  Laterality: Right;  . Breast biopsy Right 01/24/2013    Procedure: RE-EXCICION OF BREAST CANCER, ANTERIOR MARGINS;  Surgeon: Edward Jolly, MD;  Location: [facility];  Service: General;  Laterality: Right;    Family History  Problem Relation Age of Onset  . Colon cancer    . Heart disease Mother   . Diabetes Father   . Pancreatic cancer Father   . Bone cancer Sister   . Prostate cancer Brother   . Colon cancer Brother   . Rectal cancer Sister     Allergies  Allergen Reactions  . Sular [Nisoldipine Er]     "severe headaches, swelling eyes, hands, feet, shortness of breath, weak, flushed face, brain boiling, fluid retention, high blood sugar, nervous, heart fast beating"  . Actonel [Risedronate Sodium]   . Amlodipine Besylate   . Aspirin   .  Atacand [Candesartan]   . Bextra [Valdecoxib]   . Bisoprolol-Hydrochlorothiazide   . Bystolic [Nebivolol Hcl]     "extreme weakness, heaviness in legs & arms, swelling in legs/arms/face, swollen abdomen, pain in bladder, feet pain, soreness in chest"  . Candesartan Cilexetil   . Carbamazepine   . Cefadroxil   . Cefdinir   . Celecoxib   . Cholestyramine     "itching rash on stomach, bloated, nausea, vomiting, sleeplessness, extreme pain in arms"  . Clonidine   . Clonidine Hydrochloride   . Codeine   . Ezetimibe   . Hydrazine Yellow [Tartrazine]     "does not reduce high blood pressure, pain in arm, high pressure, felt like I was on verge of heart attack, really weak"  . Hydrocodone   . Hydrocodone-Acetaminophen   . Meloxicam   . Methylprednisolone Sodium Succinate   . Morphine   . Nabumetone   . Naproxen   . Niacin   . Niaspan [Niacin Er]     "fast heart beat, high blood pressure"  . Nisoldipine   . Norvasc [Amlodipine Besylate]     "extreme fluid retention/pain)  . Optivar [Azelastine Hcl]     "sensitive to light"  . Penicillins   . Pseudoephedrine-Guaifenesin   . Risedronate Sodium   . Rofecoxib   . Ru-Tuss [Chlorphen-Pse-Atrop-Hyos-Scop]   . Sulfonamide Derivatives   . [place] [Sulfur]   . Telithromycin   . Telmisartan     "headache, difficulty urinating, high blood sugar, fluid retention"  . Terfenadine   . Trandolapril-Verapamil Hcl Er   . Ziac [Bisoprolol-Hydrochlorothiazide]     "stopped urination"  . Iodinated Diagnostic Agents Rash    "All over"  . Valium [Diazepam] Other (See Comments)    Makes her mean and hyper    Current Outpatient Prescriptions on File Prior to Visit  Medication Sig Dispense Refill  . acetaminophen (TYLENOL) 500 MG tablet Take 1,000 mg by mouth every 6 (six) hours as needed for pain.      Marland Kitchen ALPRAZolam (XANAX) 0.25 MG tablet Take 0.25 mg by mouth at bedtime as needed for anxiety.      . benzonatate (TESSALON) 100 MG capsule Take  100 mg by mouth daily as needed for cough.       . calcium carbonate (OS-CAL) 600 MG TABS Take 600 mg by mouth 2 (two) times daily with a meal.       . cholecalciferol (VITAMIN D) 1000 UNITS tablet Take 1,000 Units by mouth daily at 6 (six) AM.       . colestipol (COLESTID) 1 G tablet Take  2 g by mouth 2 (two) times daily.      . cyanocobalamin (,VITAMIN B-12,) 1000 MCG/ML injection Inject 1 mL (1,000 mcg total) into the muscle every 21 ( twenty-one) days.  10 mL  3  . ferrous sulfate 325 (65 FE) MG tablet Take 325 mg by mouth daily with breakfast.       . fexofenadine (ALLEGRA) 60 MG tablet Take 60 mg by mouth daily.       . fish oil-omega-3 fatty acids 1000 MG capsule Take 1 g by mouth daily.       . nateglinide (STARLIX) 120 MG tablet Take 120 mg by mouth daily.       . polyvinyl alcohol (LIQUIFILM TEARS) 1.4 % ophthalmic solution Place 1 drop into both eyes as needed (dry eyes).      . ranitidine (ZANTAC) 150 MG capsule Take 150 mg by mouth 2 (two) times daily.       . SitaGLIPtin-MetFORMIN HCl (JANUMET XR) [phone] MG TB24 Take 1 tablet by mouth daily.  90 tablet  3  . trandolapril-verapamil (TARKA) 2-180 MG per tablet Take 1 tablet by mouth daily.       Current Facility-Administered Medications on File Prior to Visit  Medication Dose Route Frequency Provider Last Rate Last Dose  . cyanocobalamin ((VITAMIN B-12)) injection 1,000 mcg  1,000 mcg Intramuscular Q30 days Ricard Dillon, MD   1,000 mcg at 02/13/11 0936    BP 140/62  Pulse 93  Temp(Src) 98.3 F (36.8 C) (Oral)  Ht 5' 7.5" (1.715 m)  Wt 142 lb (64.411 kg)  BMI 21.90 kg/m2  SpO2 98%chart    Objective:   Physical Exam  Constitutional: She is oriented to person, place, and time. She appears well-developed and well-nourished.  HENT:  Head: Normocephalic.  Right Ear: External ear normal.  Left Ear: External ear normal.  Nose: Nose normal.  Mouth/Throat: Oropharynx is clear and moist.  Neck: Normal range of motion. Neck  supple. No thyromegaly present.  Cardiovascular: Normal rate, regular rhythm and normal heart sounds.   Pulmonary/Chest: Effort normal and breath sounds normal.  Abdominal: Soft. Bowel sounds are normal.  Musculoskeletal: Normal range of motion. She exhibits no edema and no tenderness.  Neurological: She is alert and oriented to person, place, and time. She has normal reflexes.  Skin: Skin is warm and dry.  Psychiatric: She has a normal mood and affect.          Assessment & Plan:  [name] was seen today for er follow up.  Diagnoses and associated orders for this visit:  Syncope and collapse - Ambulatory referral to Cardiology - Holter monitor - 24 hour; Future - Carotid duplex; Future  Other Orders - lidocaine (LIDODERM) 5 %; Place 1 patch onto the skin daily. Remove & Discard patch within 12 hours or as directed by MD   Call the office with any questions or concerns. Recheck as scheduled and as needed.

## 2013-08-08 NOTE — Telephone Encounter (Signed)
Received call from [facility] with appointment dates and times for pt.  I called pt but LMOM for her to call our office or [place] to get appointment dates and times.  The appointments are on the appointment desk for carotid doppler, monitor placement and an appointment with DR. Skains.

## 2013-08-09 NOTE — Telephone Encounter (Signed)
[name] is scheduled for Monitor Placement on 08/14/13 at 11:30 and Carotid Doppler at 12 pm same day, per [facility].  Vaughan Basta would also like for you to remind [name] of her appointment with Dr. Marlou Porch on 08/31/13 at 1:15.  I called yesterday and left her a message to call the office for her appointment date and times.

## 2013-08-11 ENCOUNTER — Encounter (HOSPITAL_COMMUNITY): Payer: Medicare Other

## 2013-08-14 ENCOUNTER — Encounter: Payer: Self-pay | Admitting: *Deleted

## 2013-08-14 ENCOUNTER — Encounter (INDEPENDENT_AMBULATORY_CARE_PROVIDER_SITE_OTHER): Payer: Medicare Other

## 2013-08-14 ENCOUNTER — Ambulatory Visit (HOSPITAL_COMMUNITY): Payer: Medicare Other | Attending: Family | Admitting: Cardiology

## 2013-08-14 DIAGNOSIS — R55 Syncope and collapse: Secondary | ICD-10-CM

## 2013-08-14 DIAGNOSIS — I6529 Occlusion and stenosis of unspecified carotid artery: Secondary | ICD-10-CM | POA: Diagnosis not present

## 2013-08-14 NOTE — Progress Notes (Signed)
Carotid duplex complete 

## 2013-08-14 NOTE — Progress Notes (Signed)
Patient ID: [name], female   DOB: 1930/01/25, 80 y.o.   MRN: [id number] E-[facility] 24 hour holter monitor applied to patient.

## 2013-08-21 ENCOUNTER — Telehealth: Payer: Self-pay | Admitting: Internal Medicine

## 2013-08-21 NOTE — Telephone Encounter (Signed)
Pt was seen by padonda on 08/07/13 and is wanting to get her results from her heart monitoring test was was done on 08/14/13

## 2013-08-21 NOTE — Telephone Encounter (Signed)
Spoke with to advise that no results have been documented yet. Status still says "in process." Advised pt that I would call cardiology to see when it be resulted.  Fax number and Padonda Campbell's name given to [facility] rep who states the nurse will look into it

## 2013-08-23 NOTE — Telephone Encounter (Signed)
We have not heard from [facility] concerning pt's holter results. Pt has appt with Dr. Ron Parker in [place]. Message sent to Dr. Ron Parker

## 2013-08-24 ENCOUNTER — Telehealth: Payer: Self-pay | Admitting: Oncology

## 2013-08-24 NOTE — Telephone Encounter (Signed)
, °

## 2013-08-25 ENCOUNTER — Telehealth: Payer: Self-pay

## 2013-08-25 NOTE — Telephone Encounter (Signed)
Relevant patient education mailed to patient.  

## 2013-08-27 ENCOUNTER — Encounter: Payer: Self-pay | Admitting: Cardiology

## 2013-08-27 DIAGNOSIS — R9389 Abnormal findings on diagnostic imaging of other specified body structures: Secondary | ICD-10-CM | POA: Insufficient documentation

## 2013-08-27 DIAGNOSIS — I739 Peripheral vascular disease, unspecified: Secondary | ICD-10-CM

## 2013-08-27 DIAGNOSIS — R55 Syncope and collapse: Secondary | ICD-10-CM | POA: Insufficient documentation

## 2013-08-27 DIAGNOSIS — I779 Disorder of arteries and arterioles, unspecified: Secondary | ICD-10-CM | POA: Insufficient documentation

## 2013-08-27 NOTE — Progress Notes (Signed)
Patient ID: [name], female   DOB: 1929-11-21, 80 y.o.   MRN: [id number] I have been informed that I will be seeing the patient in the future. I have been reviewing the chart. The carotid Doppler shows only slight disease. However there is mention of abnormal appearance of the thyroid. There are small avascular structures in both lobes. I will ask primary care to followup this finding.  Daryel November, MD

## 2013-08-28 ENCOUNTER — Other Ambulatory Visit: Payer: Self-pay | Admitting: Internal Medicine

## 2013-08-28 DIAGNOSIS — E049 Nontoxic goiter, unspecified: Secondary | ICD-10-CM

## 2013-08-28 NOTE — Telephone Encounter (Signed)
Please let the patient know that the monitor has been reviewed. There are no significant abnormalities. At times there is a mild variation in her heart rate that I am not concerned about. We will discuss this when I see her in the office.

## 2013-08-28 NOTE — Telephone Encounter (Signed)
I spoke with Dr. Arnoldo Morale today. He will followup the thyroid findings on the patient's carotid Doppler

## 2013-08-28 NOTE — Telephone Encounter (Signed)
The pt is advised, she verbalized understanding and she has a follow up appointment scheduled on 5/5 at 3 pm that she is aware of.

## 2013-08-30 ENCOUNTER — Ambulatory Visit
Admission: RE | Admit: 2013-08-30 | Discharge: 2013-08-30 | Disposition: A | Discharge status: Home / Self Care | Payer: Medicare Other | Source: Ambulatory Visit | Attending: Internal Medicine | Admitting: Internal Medicine

## 2013-08-30 DIAGNOSIS — E049 Nontoxic goiter, unspecified: Secondary | ICD-10-CM

## 2013-08-30 DIAGNOSIS — E042 Nontoxic multinodular goiter: Secondary | ICD-10-CM | POA: Diagnosis not present

## 2013-08-31 ENCOUNTER — Institutional Professional Consult (permissible substitution): Payer: Medicare Other | Admitting: Cardiology

## 2013-08-31 ENCOUNTER — Telehealth: Payer: Self-pay | Admitting: Internal Medicine

## 2013-08-31 DIAGNOSIS — E041 Nontoxic single thyroid nodule: Secondary | ICD-10-CM

## 2013-08-31 NOTE — Telephone Encounter (Signed)
Pt req call back about the results of her thyroid US

## 2013-09-01 NOTE — Telephone Encounter (Signed)
There are multiple nodules in the thyroid as well as some associated lymphadenopathy that might suggest thyroiditis or other diagnoses that need to be evaluated by an endocrinologist please refer her to endocrine for examination and possible biopsy of her thyroid

## 2013-09-01 NOTE — Telephone Encounter (Signed)
Patient is aware of results and referral placed

## 2013-09-05 ENCOUNTER — Encounter: Payer: Self-pay | Admitting: Cardiology

## 2013-09-05 ENCOUNTER — Ambulatory Visit (INDEPENDENT_AMBULATORY_CARE_PROVIDER_SITE_OTHER): Payer: Medicare Other | Admitting: Cardiology

## 2013-09-05 VITALS — BP 130/78 | HR 72 | Ht 67.5 in | Wt 142.0 lb

## 2013-09-05 DIAGNOSIS — I6529 Occlusion and stenosis of unspecified carotid artery: Secondary | ICD-10-CM | POA: Diagnosis not present

## 2013-09-05 DIAGNOSIS — I779 Disorder of arteries and arterioles, unspecified: Secondary | ICD-10-CM

## 2013-09-05 DIAGNOSIS — I739 Peripheral vascular disease, unspecified: Secondary | ICD-10-CM

## 2013-09-05 DIAGNOSIS — I10 Essential (primary) hypertension: Secondary | ICD-10-CM

## 2013-09-05 DIAGNOSIS — R55 Syncope and collapse: Secondary | ICD-10-CM | POA: Diagnosis not present

## 2013-09-05 DIAGNOSIS — E042 Nontoxic multinodular goiter: Secondary | ICD-10-CM | POA: Diagnosis not present

## 2013-09-05 NOTE — Patient Instructions (Signed)
Your physician has requested that you have an echocardiogram. Echocardiography is a painless test that uses sound waves to create images of your heart. It provides your doctor with information about the size and shape of your heart and how well your heart's chambers and valves are working. This procedure takes approximately one hour. There are no restrictions for this procedure.   Your physician recommends that you schedule a follow-up appointment in: 10 weeks  Dr Ron Parker is referring you to one of our EP cardiologist for possible LINQ.

## 2013-09-05 NOTE — Assessment & Plan Note (Signed)
Blood pressure is treated. No change in therapy. 

## 2013-09-05 NOTE — Assessment & Plan Note (Signed)
She has multiple thyroid nodules that to meet criteria for biopsy. Primary care is referring her to endocrinology.

## 2013-09-05 NOTE — Assessment & Plan Note (Signed)
Her carotid Doppler revealed mild bilateral disease on August 16, 2012. No further workup needed.

## 2013-09-05 NOTE — Progress Notes (Signed)
Patient ID: [name], female   DOB: [birth year], 80 y.o.   MRN: [id number]    HPI  The patient is referred to me for cardiac evaluation and care for an episode of syncope. I saw this patient in the remote past. I am not able to obtain any of these records. She tells me that she had an abnormal EKG in the past and that together we decided it was nothing to worry about. I assume she had an echo in the past but I do not have that data.  More recently the patient has had several episodes of syncope. She remembers an episode from August, 2014. The more recent episode was in April, 2015. This episode was witnessed. The patient had no warning. She found herself on the ground as witnessed by her family. There was no seizure. There was no incontinence. She was taken to the emergency room and assessed fully. No obvious etiology was found. Hospitalization was recommended but the patient refused. She went home and wore a 24-hour Holter monitor. That study revealed frequent PACs and rare PVCs. I outlined the report in the record. There was sinus rhythm with rates from 41-105. There was a 5 beat run of atrial tachycardia with a rate of 140. There were 5 seconds of sinus bradycardia with a rate of 41 at 7:34 in the morning. There was a 12 beat run of atrial tachycardia with a rate of 118 at 8:00 in the morning. This was followed by sinus bradycardia. The patient did not have any significant symptoms with these rhythms.  Carotid Doppler revealed only minimal disease. However it was noted that she had bilateral thyroid nodules. She has had a thyroid ultrasound which does show multiple nodules. The nodules to meet criteria for biopsy. Her primary care team is referring her to endocrinology for this evaluation. TSH has not been done. I consider this but failed to order a TSH while she was here.  The patient's episodes of syncope seem rather dramatic. She is a good historian. She gets absolutely no warning. Her  two-dimensional echo is not yet done. Her EKG reveals sinus rhythm but decreased R wave laterally. There is also question of an inferior infarct.  Allergies  Allergen Reactions  . Sular [Nisoldipine Er]     "severe headaches, swelling eyes, hands, feet, shortness of breath, weak, flushed face, brain boiling, fluid retention, high blood sugar, nervous, heart fast beating"  . Actonel [Risedronate Sodium]   . Amlodipine Besylate   . Aspirin   . Atacand [Candesartan]   . Bextra [Valdecoxib]   . Bisoprolol-Hydrochlorothiazide   . Bystolic [Nebivolol Hcl]     "extreme weakness, heaviness in legs & arms, swelling in legs/arms/face, swollen abdomen, pain in bladder, feet pain, soreness in chest"  . Candesartan Cilexetil   . Carbamazepine   . Cefadroxil   . Cefdinir   . Celecoxib   . Cholestyramine     "itching rash on stomach, bloated, nausea, vomiting, sleeplessness, extreme pain in arms"  . Clonidine   . Clonidine Hydrochloride   . Codeine   . Ezetimibe   . Hydrazine Yellow [Tartrazine]     "does not reduce high blood pressure, pain in arm, high pressure, felt like I was on verge of heart attack, really weak"  . Hydrocodone   . Hydrocodone-Acetaminophen   . Meloxicam   . Methylprednisolone Sodium Succinate   . Morphine   . Nabumetone   . Naproxen   . Niacin   . Niaspan Durene Cal  Er]     "fast heart beat, high blood pressure"  . Nisoldipine   . Norvasc [Amlodipine Besylate]     "extreme fluid retention/pain)  . Optivar [Azelastine Hcl]     "sensitive to light"  . Penicillins   . Pseudoephedrine-Guaifenesin   . Risedronate Sodium   . Rofecoxib   . Ru-Tuss [Chlorphen-Pse-Atrop-Hyos-Scop]   . Sulfonamide Derivatives   . [place] [Sulfur]   . Telithromycin   . Telmisartan     "headache, difficulty urinating, high blood sugar, fluid retention"  . Terfenadine   . Trandolapril-Verapamil Hcl Er   . Ziac [Bisoprolol-Hydrochlorothiazide]     "stopped urination"  . Iodinated  Diagnostic Agents Rash    "All over"  . Valium [Diazepam] Other (See Comments)    Makes her mean and hyper    Current Outpatient Prescriptions  Medication Sig Dispense Refill  . acetaminophen (TYLENOL) 500 MG tablet Take 1,000 mg by mouth every 6 (six) hours as needed for pain.      Marland Kitchen ALPRAZolam (XANAX) 0.25 MG tablet Take 0.25 mg by mouth at bedtime as needed for anxiety.      . benzonatate (TESSALON) 100 MG capsule Take 100 mg by mouth daily as needed for cough.       . calcium carbonate (OS-CAL) 600 MG TABS Take 600 mg by mouth 2 (two) times daily with a meal.       . cholecalciferol (VITAMIN D) 1000 UNITS tablet Take 1,000 Units by mouth daily at 6 (six) AM.       . colestipol (COLESTID) 1 G tablet Take 2 g by mouth 2 (two) times daily.      . cyanocobalamin (,VITAMIN B-12,) 1000 MCG/ML injection Inject 1 mL (1,000 mcg total) into the muscle every 21 ( twenty-one) days.  10 mL  3  . ferrous sulfate 325 (65 FE) MG tablet Take 325 mg by mouth daily with breakfast.       . fexofenadine (ALLEGRA) 60 MG tablet Take 60 mg by mouth daily.       . fish oil-omega-3 fatty acids 1000 MG capsule Take 1 g by mouth daily.       Marland Kitchen lidocaine (LIDODERM) 5 % Place 1 patch onto the skin daily. Remove & Discard patch within 12 hours or as directed by MD  90 patch  1  . nateglinide (STARLIX) 120 MG tablet Take 120 mg by mouth daily.       . polyvinyl alcohol (LIQUIFILM TEARS) 1.4 % ophthalmic solution Place 1 drop into both eyes as needed (dry eyes).      . ranitidine (ZANTAC) 150 MG capsule Take 150 mg by mouth 2 (two) times daily.       . SitaGLIPtin-MetFORMIN HCl (JANUMET XR) [phone] MG TB24 Take 1 tablet by mouth daily.  90 tablet  3  . trandolapril-verapamil (TARKA) 2-180 MG per tablet Take 1 tablet by mouth daily.       Current Facility-Administered Medications  Medication Dose Route Frequency Provider Last Rate Last Dose  . cyanocobalamin ((VITAMIN B-12)) injection 1,000 mcg  1,000 mcg  Intramuscular Q30 days Ricard Dillon, MD   1,000 mcg at 02/13/11 4034    History   Social History  . Marital Status: Widowed    Spouse Name: N/A    Number of Children: N/A  . Years of Education: N/A   Occupational History  . retired    Social History Main Topics  . Smoking status: Never Smoker   . Smokeless tobacco:  Not on file  . Alcohol Use: No  . Drug Use: No  . Sexual Activity: Yes     Comment: menarche age 57, hysterectomy age 50, HRT x 2-3 mos, G1- miscarriage   Other Topics Concern  . Not on file   Social History Narrative  . No narrative on file    Family History  Problem Relation Age of Onset  . Colon cancer    . Heart disease Mother   . Diabetes Father   . Pancreatic cancer Father   . Bone cancer Sister   . Prostate cancer Brother   . Colon cancer Brother   . Rectal cancer Sister     Past Medical History  Diagnosis Date  . Seasonal allergies   . HTN (hypertension)   . Osteoarthritis   . Palpitations   . Anemia   . History of colonic polyps   . GERD (gastroesophageal reflux disease)   . Hyperlipidemia   . Chronic facial pain   . Vitamin B12 deficiency   . Dyslipidemia   . Chronic foot pain   . Chronic cough   . Obesity   . Anxiety   . Skin cancer   . Complication of anesthesia     Sore jaw; could not chew or move mouth  . Diabetes mellitus     type 2 niddm x 20 years  . Melanoma   . Metatarsal bone fracture 2014  . Hammer toe     bilateral  . Cancer of right breast 12/26/12    right breast 12:00 o'clock, DCIS  . Hx of radiation therapy 03/07/13- 03/29/13    right breast 4250 cGy 17 sessions    Past Surgical History  Procedure Laterality Date  . Colonoscopy    . Polypectomy      small adenomatous  . Abdominal hysterectomy    . Cholecystectomy    . Hernia repair    . Knee arthroscopy Bilateral   . Ulnar tunnel release    . Eye surgery    . Cataract extraction w/ intraocular lens implant Right   . Bunionectomy    . Breast  lumpectomy with needle localization Right 01/17/2013    Procedure: BREAST LUMPECTOMY WITH NEEDLE LOCALIZATION;  Surgeon: Edward Jolly, MD;  Location: [facility];  Service: General;  Laterality: Right;  . Breast biopsy Right 01/24/2013    Procedure: RE-EXCICION OF BREAST CANCER, ANTERIOR MARGINS;  Surgeon: Edward Jolly, MD;  Location: [facility];  Service: General;  Laterality: Right;    Patient Active Problem List   Diagnosis Date Noted  . Multiple thyroid nodules 09/05/2013  . Syncope 08/27/2013  . Carotid artery disease 08/27/2013  . Abnormal thyroid ultrasound 08/27/2013  . Cancer of right breast   . Cancer of central portion of female breast 12/30/2012  . OSTEOPOROSIS 03/06/2010  . TENSION TYPE HEADACHE UNSPECIFIED 07/11/2009  . ASTHMA 03/05/2009  . ACUTE MAXILLARY SINUSITIS 08/16/2008  . EUSTACHIAN TUBE DYSFUNCTION, RIGHT 03/21/2008  . IRRITABLE BOWEL SYNDROME  diarrhea type 03/21/2008  . COUGH 03/06/2008  . ANEMIA-NOS 12/16/2006  . GERD 12/16/2006  . COLONIC POLYPS, HX OF 12/16/2006  . DM w/o Complication Type II 53/61/4431  . HYPERLIPIDEMIA 10/29/2006  . HYPERTENSION 10/29/2006  . Allergic rhinitis, cause unspecified 10/29/2006  . OSTEOARTHRITIS 10/29/2006  . HEADACHE 10/29/2006    ROS   Patient denies fever, chills, headache, sweats, rash, change in vision, change in hearing, chest pain, cough, nausea or vomiting, urinary symptoms. All other systems are reviewed and are  negative.  PHYSICAL EXAM  The patient is here with her daughter who is also a reliable historian. She is oriented to person time and place. Affect is normal. Head is atraumatic. Conjunctiva and sclerae are normal. There is no jugulovenous distention. Lungs are clear. Respiratory effort is nonlabored. Cardiac exam reveals S1 and S2. There no clicks or significant murmurs. Abdomen is soft. There is no peripheral edema. There no musculoskeletal deformities. There are no skin rashes.  Filed Vitals:    09/05/13 1527  BP: 130/78  Pulse: 72  Height: 5' 7.5" (1.715 m)  Weight: 142 lb (64.411 kg)   EKG is done today and reviewed by me. I have compared it to the recent EKG in the hospital and an older EKG. There is no change. There is sinus rhythm. There is evidence to suggest an old inferior infarct. Cannot rule out anterior infarct. The EKG is unchanged.  ASSESSMENT & PLAN

## 2013-09-05 NOTE — Assessment & Plan Note (Addendum)
The patient has had syncope. She has had several episodes in the past year. The most recent episode occurred in April, 2015. She had no warning. It was witnessed. Her monitor reveals mild supraventricular arrhythmias and mild sinus bradycardia. There was no definite atrial fibrillation. I am impressed and concerned by this syncope. I feel that a LINQ monitor [place] be very helpful. I am referring her to electrophysiology for consideration of this device. It is possible that the family should proceed with neurologic workup also. I will see her for cardiology followup as we gather more information.    As part of today's evaluation I spent greater than 60 minutes with her total care. More than half of this time was directly with the patient and her family reviewing all aspects of her history and the evaluation. I talked with her about the need for electrophysiology evaluation. In addition to this time I had previously reviewed her records extensively.

## 2013-09-11 ENCOUNTER — Other Ambulatory Visit: Payer: Self-pay | Admitting: Internal Medicine

## 2013-09-11 ENCOUNTER — Ambulatory Visit: Payer: Medicare Other | Admitting: Oncology

## 2013-09-13 ENCOUNTER — Other Ambulatory Visit: Payer: Self-pay | Admitting: Internal Medicine

## 2013-09-19 ENCOUNTER — Ambulatory Visit (INDEPENDENT_AMBULATORY_CARE_PROVIDER_SITE_OTHER): Payer: Medicare Other | Admitting: Endocrinology

## 2013-09-19 ENCOUNTER — Encounter: Payer: Self-pay | Admitting: Endocrinology

## 2013-09-19 VITALS — BP 124/76 | HR 86 | Temp 97.6°F | Ht 67.5 in | Wt 143.0 lb

## 2013-09-19 DIAGNOSIS — I6529 Occlusion and stenosis of unspecified carotid artery: Secondary | ICD-10-CM

## 2013-09-19 DIAGNOSIS — E042 Nontoxic multinodular goiter: Secondary | ICD-10-CM | POA: Diagnosis not present

## 2013-09-19 LAB — TSH: TSH: 1.33 u[IU]/mL (ref 0.35–4.50)

## 2013-09-19 NOTE — Progress Notes (Signed)
Subjective:    Patient ID: [name], female    DOB: Jul 08, 1929, 80 y.o.   MRN: [id number]  HPI Pt had a syncopal episode in April of 2015.  She was eval with carotid US, and there was incidentally noted to have moderate nodules in the thyroid.  No assoc pain.  No previous thyroid problems.  She had XRT to the right breast in 2014.   Past Medical History  Diagnosis Date  . Seasonal allergies   . HTN (hypertension)   . Osteoarthritis   . Palpitations   . Anemia   . History of colonic polyps   . GERD (gastroesophageal reflux disease)   . Hyperlipidemia   . Chronic facial pain   . Vitamin B12 deficiency   . Dyslipidemia   . Chronic foot pain   . Chronic cough   . Obesity   . Anxiety   . Skin cancer   . Complication of anesthesia     Sore jaw; could not chew or move mouth  . Diabetes mellitus     type 2 niddm x 20 years  . Melanoma   . Metatarsal bone fracture 2014  . Hammer toe     bilateral  . Cancer of right breast 12/26/12    right breast 12:00 o'clock, DCIS  . Hx of radiation therapy 03/07/13- 03/29/13    right breast 4250 cGy 17 sessions    Past Surgical History  Procedure Laterality Date  . Colonoscopy    . Polypectomy      small adenomatous  . Abdominal hysterectomy    . Cholecystectomy    . Hernia repair    . Knee arthroscopy Bilateral   . Ulnar tunnel release    . Eye surgery    . Cataract extraction w/ intraocular lens implant Right   . Bunionectomy    . Breast lumpectomy with needle localization Right 01/17/2013    Procedure: BREAST LUMPECTOMY WITH NEEDLE LOCALIZATION;  Surgeon: Edward Jolly, MD;  Location: [facility];  Service: General;  Laterality: Right;  . Breast biopsy Right 01/24/2013    Procedure: RE-EXCICION OF BREAST CANCER, ANTERIOR MARGINS;  Surgeon: Edward Jolly, MD;  Location: [facility];  Service: General;  Laterality: Right;    History   Social History  . Marital Status: Widowed    Spouse Name: N/A    Number of Children: N/A  .  Years of Education: N/A   Occupational History  . retired    Social History Main Topics  . Smoking status: Never Smoker   . Smokeless tobacco: Not on file  . Alcohol Use: No  . Drug Use: No  . Sexual Activity: Yes     Comment: menarche age 42, hysterectomy age 51, HRT x 2-3 mos, G1- miscarriage   Other Topics Concern  . Not on file   Social History Narrative  . No narrative on file    Current Outpatient Prescriptions on File Prior to Visit  Medication Sig Dispense Refill  . acetaminophen (TYLENOL) 500 MG tablet Take 1,000 mg by mouth every 6 (six) hours as needed for pain.      Marland Kitchen ALPRAZolam (XANAX) 0.25 MG tablet Take 0.25 mg by mouth at bedtime as needed for anxiety.      . calcium carbonate (OS-CAL) 600 MG TABS Take 600 mg by mouth 2 (two) times daily with a meal.       . cholecalciferol (VITAMIN D) 1000 UNITS tablet Take 1,000 Units by mouth daily at 6 (  six) AM.       . cyanocobalamin (,VITAMIN B-12,) 1000 MCG/ML injection Inject 1 mL (1,000 mcg total) into the muscle every 21 ( twenty-one) days.  10 mL  3  . ferrous sulfate 325 (65 FE) MG tablet Take 325 mg by mouth daily with breakfast.       . fexofenadine (ALLEGRA) 60 MG tablet Take 60 mg by mouth daily.       . fish oil-omega-3 fatty acids 1000 MG capsule Take 1 g by mouth daily.       Marland Kitchen lidocaine (LIDODERM) 5 % Place 1 patch onto the skin daily. Remove & Discard patch within 12 hours or as directed by MD  90 patch  1  . MICRONIZED COLESTIPOL HCL 1 G tablet TAKE 2 TABLETS TWICE A DAY  360 tablet  2  . nateglinide (STARLIX) 120 MG tablet Take 120 mg by mouth daily.       . polyvinyl alcohol (LIQUIFILM TEARS) 1.4 % ophthalmic solution Place 1 drop into both eyes as needed (dry eyes).      . ranitidine (ZANTAC) 150 MG capsule Take 150 mg by mouth 2 (two) times daily.       . SitaGLIPtin-MetFORMIN HCl (JANUMET XR) [phone] MG TB24 Take 1 tablet by mouth daily.  90 tablet  3  . TARKA 2-180 MG per tablet TAKE 1 TABLET DAILY  90  tablet  1  . trandolapril-verapamil (TARKA) 2-180 MG per tablet Take 1 tablet by mouth daily.       Current Facility-Administered Medications on File Prior to Visit  Medication Dose Route Frequency Provider Last Rate Last Dose  . cyanocobalamin ((VITAMIN B-12)) injection 1,000 mcg  1,000 mcg Intramuscular Q30 days Ricard Dillon, MD   1,000 mcg at 02/13/11 8469    Allergies  Allergen Reactions  . Sular [Nisoldipine Er]     "severe headaches, swelling eyes, hands, feet, shortness of breath, weak, flushed face, brain boiling, fluid retention, high blood sugar, nervous, heart fast beating"  . Actonel [Risedronate Sodium]   . Amlodipine Besylate   . Aspirin   . Atacand [Candesartan]   . Bextra [Valdecoxib]   . Bisoprolol-Hydrochlorothiazide   . Bystolic [Nebivolol Hcl]     "extreme weakness, heaviness in legs & arms, swelling in legs/arms/face, swollen abdomen, pain in bladder, feet pain, soreness in chest"  . Candesartan Cilexetil   . Carbamazepine   . Cefadroxil   . Cefdinir   . Celecoxib   . Cholestyramine     "itching rash on stomach, bloated, nausea, vomiting, sleeplessness, extreme pain in arms"  . Clonidine   . Clonidine Hydrochloride   . Codeine   . Ezetimibe   . Hydrazine Yellow [Tartrazine]     "does not reduce high blood pressure, pain in arm, high pressure, felt like I was on verge of heart attack, really weak"  . Hydrocodone   . Hydrocodone-Acetaminophen   . Meloxicam   . Methylprednisolone Sodium Succinate   . Morphine   . Nabumetone   . Naproxen   . Niacin   . Niaspan [Niacin Er]     "fast heart beat, high blood pressure"  . Nisoldipine   . Norvasc [Amlodipine Besylate]     "extreme fluid retention/pain)  . Optivar [Azelastine Hcl]     "sensitive to light"  . Penicillins   . Pseudoephedrine-Guaifenesin   . Risedronate Sodium   . Rofecoxib   . Ru-Tuss [Chlorphen-Pse-Atrop-Hyos-Scop]   . Sulfonamide Derivatives   . [place] [Sulfur]   .  Telithromycin     . Telmisartan     "headache, difficulty urinating, high blood sugar, fluid retention"  . Terfenadine   . Trandolapril-Verapamil Hcl Er   . Ziac [Bisoprolol-Hydrochlorothiazide]     "stopped urination"  . Iodinated Diagnostic Agents Rash    "All over"  . Valium [Diazepam] Other (See Comments)    Makes her mean and hyper    Family History  Problem Relation Age of Onset  . Colon cancer    . Heart disease Mother   . Diabetes Father   . Pancreatic cancer Father   . Bone cancer Sister   . Prostate cancer Brother   . Colon cancer Brother   . Rectal cancer Sister   . Thyroid disease Sister     benign goiter resected    BP 124/76  Pulse 86  Temp(Src) 97.6 F (36.4 C) (Oral)  Ht 5' 7.5" (1.715 m)  Wt 143 lb (64.864 kg)  BMI 22.05 kg/m2  SpO2 96%    Review of Systems denies hair loss, muscle cramps, weight gain, constipation, numbness, blurry vision, cold intolerance, easy bruising, headache, and chest pain.  She attributes cough to "tarka" medication.  She reports difficulty with concentration, rhinorrhea, and dry skin.      Objective:   Physical Exam VS: see vs page GEN: no distress HEAD: head: no deformity eyes: no periorbital swelling, no proptosis external nose and ears are normal mouth: no lesion seen NECK: I can only appreciate 1 thyroid nodule (left, 2 cm).   CHEST WALL: no deformity LUNGS:  Clear to auscultation CV: reg rate and rhythm, no murmur MUSCULOSKELETAL: muscle bulk and strength are grossly normal.  no obvious joint swelling.  gait is normal and steady EXTEMITIES: no deformity.   no edema PULSES: no carotid bruit NEURO:  cn 2-12 grossly intact.   readily moves all 4's.  sensation is intact to touch on all 4's.   SKIN:  Normal texture and temperature.  No rash or suspicious lesion is visible.   NODES:  None palpable at the neck.  PSYCH: alert, well-oriented.  Does not appear anxious nor depressed.   outside test results are reviewed: (i reviewed  [place] report) Lab Results  Component Value Date   TSH 2.18 10/03/2012      Assessment & Plan:  Multinodular goiter, new to me: probably hereditary.  fhx of benign goiter makes malignancy very unlikely. PAD: this and advanced age are relative contraindications to elective surgery.  However, we can reconsider if rapid growth.   breast cancer: she had XRT for this.  However, it is unlikely that the thyroid bed was included in the field, so no need to consider that in the eval of the thyroid.    Patient Instructions  A thyroid blood test is requested for you today.  We'll contact you with results.   Given your advanced age, a thyroid biopsy is very unlikely to help you.  However, I am willing to do it, if you decide to.  If so, please let me know.   Please come back for a follow-up appointment in 6-12 months.   most of the time, a "lumpy thyroid" will eventually become overactive.  this is usually a slow process, happening over the span of many years.

## 2013-09-19 NOTE — Patient Instructions (Signed)
A thyroid blood test is requested for you today.  We'll contact you with results.   Given your advanced age, a thyroid biopsy is very unlikely to help you.  However, I am willing to do it, if you decide to.  If so, please let me know.   Please come back for a follow-up appointment in 6-12 months.   most of the time, a "lumpy thyroid" will eventually become overactive.  this is usually a slow process, happening over the span of many years.

## 2013-09-20 ENCOUNTER — Ambulatory Visit (HOSPITAL_COMMUNITY): Payer: Medicare Other | Attending: Internal Medicine | Admitting: Radiology

## 2013-09-20 DIAGNOSIS — R55 Syncope and collapse: Secondary | ICD-10-CM | POA: Insufficient documentation

## 2013-09-20 NOTE — Progress Notes (Signed)
Echocardiogram performed.  

## 2013-09-26 ENCOUNTER — Encounter (INDEPENDENT_AMBULATORY_CARE_PROVIDER_SITE_OTHER): Payer: Self-pay

## 2013-09-26 ENCOUNTER — Encounter: Payer: Self-pay | Admitting: Internal Medicine

## 2013-09-26 ENCOUNTER — Ambulatory Visit (INDEPENDENT_AMBULATORY_CARE_PROVIDER_SITE_OTHER): Payer: Medicare Other | Admitting: Internal Medicine

## 2013-09-26 VITALS — BP 142/78 | HR 81 | Ht 67.5 in | Wt 142.2 lb

## 2013-09-26 DIAGNOSIS — R55 Syncope and collapse: Secondary | ICD-10-CM

## 2013-09-26 DIAGNOSIS — I10 Essential (primary) hypertension: Secondary | ICD-10-CM

## 2013-09-26 NOTE — Assessment & Plan Note (Signed)
Her episodes are of unclear etiology. I have asked the patient to undergo insertion of an ILR. Hopefully we can characterize the mechanism with ILR insertion. She is on no AV nodal blocking meds.

## 2013-09-26 NOTE — Assessment & Plan Note (Signed)
Her blood pressure is minimally elevated today. She will maintain a low sodium diet. No change in meds for now. She is on no AV nodal blocking drugs.

## 2013-09-26 NOTE — Progress Notes (Signed)
HPI [name] is referred today by Dr. Ron Parker for evaluation of syncope. She is a pleasant 80 yo woman with a h/o multiple allergies and DM who began experiencing syncope approx. 8 months ago. In the interim she has had 5 episodes. She has worn a cardiac monitor which was unrevealing. She notes that her syncopal episodes occur suddenly without warning. No prodrome. No correlation to activity or posture or time of the day or palpitations.  Allergies  Allergen Reactions  . Sular [Nisoldipine Er]     "severe headaches, swelling eyes, hands, feet, shortness of breath, weak, flushed face, brain boiling, fluid retention, high blood sugar, nervous, heart fast beating"  . Tegretol [Carbamazepine]     Blood poisoning   . Actonel [Risedronate Sodium]   . Amlodipine Besylate   . Aspirin   . Atacand [Candesartan]   . Bextra [Valdecoxib]   . Bisoprolol-Hydrochlorothiazide   . Bystolic [Nebivolol Hcl]     "extreme weakness, heaviness in legs & arms, swelling in legs/arms/face, swollen abdomen, pain in bladder, feet pain, soreness in chest"  . Candesartan Cilexetil   . Cefadroxil   . Cefdinir   . Celecoxib   . Cholestyramine     "itching rash on stomach, bloated, nausea, vomiting, sleeplessness, extreme pain in arms"  . Clonidine   . Clonidine Hydrochloride   . Codeine   . Ezetimibe   . Hydrazine Yellow [Tartrazine]     "does not reduce high blood pressure, pain in arm, high pressure, felt like I was on verge of heart attack, really weak"  . Hydrocodone   . Hydrocodone-Acetaminophen   . Meloxicam   . Methylprednisolone Sodium Succinate   . Morphine   . Nabumetone   . Naproxen   . Niacin   . Niaspan [Niacin Er]     "fast heart beat, high blood pressure"  . Nisoldipine   . Norvasc [Amlodipine Besylate]     "extreme fluid retention/pain)  . Optivar [Azelastine Hcl]     "sensitive to light"  . Penicillins   . Pseudoephedrine-Guaifenesin   . Risedronate Sodium   . Rofecoxib   .  Ru-Tuss [Chlorphen-Pse-Atrop-Hyos-Scop]   . Sulfonamide Derivatives   . [place] [Sulfur]   . Telithromycin   . Telmisartan     "headache, difficulty urinating, high blood sugar, fluid retention"  . Terfenadine   . Trandolapril-Verapamil Hcl Er   . Ziac [Bisoprolol-Hydrochlorothiazide]     "stopped urination"  . Iodinated Diagnostic Agents Rash    "All over"  . Valium [Diazepam] Other (See Comments)    Makes her mean and hyper     Current Outpatient Prescriptions  Medication Sig Dispense Refill  . acetaminophen (TYLENOL) 500 MG tablet Take 1,000 mg by mouth every 6 (six) hours as needed for pain.      Marland Kitchen ALPRAZolam (XANAX) 0.25 MG tablet Take 0.25 mg by mouth at bedtime as needed for anxiety.      . calcium carbonate (OS-CAL) 600 MG TABS Take 600 mg by mouth daily.       . cholecalciferol (VITAMIN D) 1000 UNITS tablet Take 1,000 Units by mouth daily at 6 (six) AM.       . cyanocobalamin (,VITAMIN B-12,) 1000 MCG/ML injection Inject 1 mL (1,000 mcg total) into the muscle every 21 ( twenty-one) days.  10 mL  3  . ferrous sulfate 325 (65 FE) MG tablet Take 325 mg by mouth daily with breakfast.       . fexofenadine (ALLEGRA) 60  MG tablet Take 60 mg by mouth daily.       . fish oil-omega-3 fatty acids 1000 MG capsule Take 1 g by mouth daily.       Marland Kitchen lidocaine (LIDODERM) 5 % Place 1 patch onto the skin daily. Remove & Discard patch within 12 hours or as directed by MD  90 patch  1  . MICRONIZED COLESTIPOL HCL 1 G tablet TAKE 2 TABLETS TWICE A DAY  360 tablet  2  . nateglinide (STARLIX) 120 MG tablet Take 120 mg by mouth as needed.       . polyvinyl alcohol (LIQUIFILM TEARS) 1.4 % ophthalmic solution Place 1 drop into both eyes as needed (dry eyes).      . ranitidine (ZANTAC) 150 MG capsule Take 150 mg by mouth as needed.       . SitaGLIPtin-MetFORMIN HCl (JANUMET XR) [phone] MG TB24 Take 1 tablet by mouth daily.  90 tablet  3  . trandolapril-verapamil (TARKA) 2-180 MG per tablet Take 1  tablet by mouth daily.       Current Facility-Administered Medications  Medication Dose Route Frequency Provider Last Rate Last Dose  . cyanocobalamin ((VITAMIN B-12)) injection 1,000 mcg  1,000 mcg Intramuscular Q30 days Ricard Dillon, MD   1,000 mcg at 02/13/11 7616     Past Medical History  Diagnosis Date  . Seasonal allergies   . HTN (hypertension)   . Osteoarthritis   . Palpitations   . Anemia   . History of colonic polyps   . GERD (gastroesophageal reflux disease)   . Hyperlipidemia   . Chronic facial pain   . Vitamin B12 deficiency   . Dyslipidemia   . Chronic foot pain   . Chronic cough   . Obesity   . Anxiety   . Skin cancer   . Complication of anesthesia     Sore jaw; could not chew or move mouth  . Diabetes mellitus     type 2 niddm x 20 years  . Melanoma   . Metatarsal bone fracture 2014  . Hammer toe     bilateral  . Cancer of right breast 12/26/12    right breast 12:00 o'clock, DCIS  . Hx of radiation therapy 03/07/13- 03/29/13    right breast 4250 cGy 17 sessions    ROS:   All systems reviewed and negative except as noted in the HPI.   Past Surgical History  Procedure Laterality Date  . Colonoscopy    . Polypectomy      small adenomatous  . Abdominal hysterectomy    . Cholecystectomy    . Hernia repair    . Knee arthroscopy Bilateral   . Ulnar tunnel release    . Eye surgery    . Cataract extraction w/ intraocular lens implant Right   . Bunionectomy    . Breast lumpectomy with needle localization Right 01/17/2013    Procedure: BREAST LUMPECTOMY WITH NEEDLE LOCALIZATION;  Surgeon: Edward Jolly, MD;  Location: [facility];  Service: General;  Laterality: Right;  . Breast biopsy Right 01/24/2013    Procedure: RE-EXCICION OF BREAST CANCER, ANTERIOR MARGINS;  Surgeon: Edward Jolly, MD;  Location: [facility];  Service: General;  Laterality: Right;     Family History  Problem Relation Age of Onset  . Colon cancer    . Heart disease Mother     . Diabetes Father   . Pancreatic cancer Father   . Bone cancer Sister   . Prostate cancer  Brother   . Colon cancer Brother   . Rectal cancer Sister   . Thyroid disease Sister     benign goiter resected     History   Social History  . Marital Status: Widowed    Spouse Name: N/A    Number of Children: N/A  . Years of Education: N/A   Occupational History  . retired    Social History Main Topics  . Smoking status: Never Smoker   . Smokeless tobacco: Not on file  . Alcohol Use: No  . Drug Use: No  . Sexual Activity: Yes     Comment: menarche age 14, hysterectomy age 73, HRT x 2-3 mos, G1- miscarriage   Other Topics Concern  . Not on file   Social History Narrative  . No narrative on file     BP 142/78  Pulse 81  Ht 5' 7.5" (1.715 m)  Wt 142 lb 3.2 oz (64.501 kg)  BMI 21.93 kg/m2  Physical Exam:  Well appearing 80 yo woman, NAD HEENT: Unremarkable Neck:  No JVD, no thyromegally Back:  No CVA tenderness Lungs:  Clear with no wheezes HEART:  Regular rate rhythm, no murmurs, no rubs, no clicks Abd:  soft, positive bowel sounds, no organomegally, no rebound, no guarding Ext:  2 plus pulses, no edema, no cyanosis, no clubbing Skin:  No rashes no nodules Neuro:  CN II through XII intact, motor grossly intact  EKG - nsr  Assess/Plan:

## 2013-09-26 NOTE — Patient Instructions (Signed)
Please be at the hospital at 12:00 on 10/09/13 for LINQ implant  Okay to eat prior to procedure

## 2013-09-27 ENCOUNTER — Encounter: Payer: Self-pay | Admitting: Cardiology

## 2013-09-27 DIAGNOSIS — R9430 Abnormal result of cardiovascular function study, unspecified: Secondary | ICD-10-CM | POA: Insufficient documentation

## 2013-10-05 ENCOUNTER — Encounter (HOSPITAL_COMMUNITY): Payer: Self-pay | Admitting: Pharmacy Technician

## 2013-10-09 ENCOUNTER — Emergency Department (HOSPITAL_COMMUNITY): Payer: Medicare Other

## 2013-10-09 ENCOUNTER — Other Ambulatory Visit: Payer: Self-pay

## 2013-10-09 ENCOUNTER — Encounter (HOSPITAL_COMMUNITY): Payer: Self-pay | Admitting: Emergency Medicine

## 2013-10-09 ENCOUNTER — Encounter (HOSPITAL_COMMUNITY): Admission: RE | Payer: Self-pay | Source: Ambulatory Visit

## 2013-10-09 ENCOUNTER — Ambulatory Visit (HOSPITAL_COMMUNITY): Admission: RE | Admit: 2013-10-09 | Payer: Medicare Other | Source: Ambulatory Visit | Admitting: Internal Medicine

## 2013-10-09 ENCOUNTER — Inpatient Hospital Stay (HOSPITAL_COMMUNITY)
Admission: EM | Admit: 2013-10-09 | Discharge: 2013-10-27 | DRG: 025 | Disposition: A | Discharge status: Skilled Nursing Facility | Payer: Medicare Other | Attending: Neurosurgery | Admitting: Neurosurgery

## 2013-10-09 DIAGNOSIS — S065X9A Traumatic subdural hemorrhage with loss of consciousness of unspecified duration, initial encounter: Secondary | ICD-10-CM | POA: Diagnosis present

## 2013-10-09 DIAGNOSIS — R51 Headache: Secondary | ICD-10-CM

## 2013-10-09 DIAGNOSIS — G936 Cerebral edema: Secondary | ICD-10-CM | POA: Diagnosis not present

## 2013-10-09 DIAGNOSIS — I779 Disorder of arteries and arterioles, unspecified: Secondary | ICD-10-CM | POA: Diagnosis present

## 2013-10-09 DIAGNOSIS — R404 Transient alteration of awareness: Secondary | ICD-10-CM | POA: Diagnosis not present

## 2013-10-09 DIAGNOSIS — Y92009 Unspecified place in unspecified non-institutional (private) residence as the place of occurrence of the external cause: Secondary | ICD-10-CM

## 2013-10-09 DIAGNOSIS — M6281 Muscle weakness (generalized): Secondary | ICD-10-CM | POA: Diagnosis not present

## 2013-10-09 DIAGNOSIS — M1990 Unspecified osteoarthritis, unspecified site: Secondary | ICD-10-CM | POA: Diagnosis present

## 2013-10-09 DIAGNOSIS — K5900 Constipation, unspecified: Secondary | ICD-10-CM | POA: Diagnosis not present

## 2013-10-09 DIAGNOSIS — J45909 Unspecified asthma, uncomplicated: Secondary | ICD-10-CM | POA: Diagnosis present

## 2013-10-09 DIAGNOSIS — E785 Hyperlipidemia, unspecified: Secondary | ICD-10-CM | POA: Diagnosis present

## 2013-10-09 DIAGNOSIS — R5381 Other malaise: Secondary | ICD-10-CM | POA: Diagnosis not present

## 2013-10-09 DIAGNOSIS — R55 Syncope and collapse: Secondary | ICD-10-CM | POA: Diagnosis not present

## 2013-10-09 DIAGNOSIS — R296 Repeated falls: Secondary | ICD-10-CM | POA: Diagnosis present

## 2013-10-09 DIAGNOSIS — Z85828 Personal history of other malignant neoplasm of skin: Secondary | ICD-10-CM

## 2013-10-09 DIAGNOSIS — Z961 Presence of intraocular lens: Secondary | ICD-10-CM | POA: Diagnosis not present

## 2013-10-09 DIAGNOSIS — S0003XA Contusion of scalp, initial encounter: Secondary | ICD-10-CM | POA: Diagnosis not present

## 2013-10-09 DIAGNOSIS — K219 Gastro-esophageal reflux disease without esophagitis: Secondary | ICD-10-CM | POA: Diagnosis present

## 2013-10-09 DIAGNOSIS — M549 Dorsalgia, unspecified: Secondary | ICD-10-CM | POA: Diagnosis not present

## 2013-10-09 DIAGNOSIS — S0083XA Contusion of other part of head, initial encounter: Secondary | ICD-10-CM | POA: Diagnosis not present

## 2013-10-09 DIAGNOSIS — F411 Generalized anxiety disorder: Secondary | ICD-10-CM | POA: Diagnosis present

## 2013-10-09 DIAGNOSIS — I6200 Nontraumatic subdural hemorrhage, unspecified: Secondary | ICD-10-CM | POA: Diagnosis present

## 2013-10-09 DIAGNOSIS — D434 Neoplasm of uncertain behavior of spinal cord: Secondary | ICD-10-CM | POA: Diagnosis not present

## 2013-10-09 DIAGNOSIS — D320 Benign neoplasm of cerebral meninges: Secondary | ICD-10-CM | POA: Diagnosis not present

## 2013-10-09 DIAGNOSIS — R58 Hemorrhage, not elsewhere classified: Secondary | ICD-10-CM | POA: Diagnosis not present

## 2013-10-09 DIAGNOSIS — Z79899 Other long term (current) drug therapy: Secondary | ICD-10-CM | POA: Diagnosis not present

## 2013-10-09 DIAGNOSIS — S22009A Unspecified fracture of unspecified thoracic vertebra, initial encounter for closed fracture: Secondary | ICD-10-CM | POA: Diagnosis not present

## 2013-10-09 DIAGNOSIS — R221 Localized swelling, mass and lump, neck: Secondary | ICD-10-CM | POA: Diagnosis not present

## 2013-10-09 DIAGNOSIS — J9819 Other pulmonary collapse: Secondary | ICD-10-CM | POA: Diagnosis not present

## 2013-10-09 DIAGNOSIS — F419 Anxiety disorder, unspecified: Secondary | ICD-10-CM | POA: Diagnosis present

## 2013-10-09 DIAGNOSIS — S066XAA Traumatic subarachnoid hemorrhage with loss of consciousness status unknown, initial encounter: Secondary | ICD-10-CM | POA: Diagnosis not present

## 2013-10-09 DIAGNOSIS — E782 Mixed hyperlipidemia: Secondary | ICD-10-CM | POA: Diagnosis present

## 2013-10-09 DIAGNOSIS — I10 Essential (primary) hypertension: Secondary | ICD-10-CM | POA: Diagnosis not present

## 2013-10-09 DIAGNOSIS — IMO0002 Reserved for concepts with insufficient information to code with codable children: Secondary | ICD-10-CM | POA: Diagnosis not present

## 2013-10-09 DIAGNOSIS — E119 Type 2 diabetes mellitus without complications: Secondary | ICD-10-CM | POA: Diagnosis not present

## 2013-10-09 DIAGNOSIS — S066X9A Traumatic subarachnoid hemorrhage with loss of consciousness of unspecified duration, initial encounter: Secondary | ICD-10-CM | POA: Diagnosis not present

## 2013-10-09 DIAGNOSIS — S22080A Wedge compression fracture of T11-T12 vertebra, initial encounter for closed fracture: Secondary | ICD-10-CM | POA: Diagnosis present

## 2013-10-09 DIAGNOSIS — D649 Anemia, unspecified: Secondary | ICD-10-CM | POA: Diagnosis not present

## 2013-10-09 DIAGNOSIS — I2510 Atherosclerotic heart disease of native coronary artery without angina pectoris: Secondary | ICD-10-CM | POA: Diagnosis present

## 2013-10-09 DIAGNOSIS — G939 Disorder of brain, unspecified: Secondary | ICD-10-CM | POA: Diagnosis not present

## 2013-10-09 DIAGNOSIS — Z853 Personal history of malignant neoplasm of breast: Secondary | ICD-10-CM

## 2013-10-09 DIAGNOSIS — Z9849 Cataract extraction status, unspecified eye: Secondary | ICD-10-CM

## 2013-10-09 DIAGNOSIS — M159 Polyosteoarthritis, unspecified: Secondary | ICD-10-CM | POA: Diagnosis not present

## 2013-10-09 DIAGNOSIS — R5383 Other fatigue: Secondary | ICD-10-CM | POA: Diagnosis not present

## 2013-10-09 DIAGNOSIS — G9389 Other specified disorders of brain: Secondary | ICD-10-CM | POA: Diagnosis present

## 2013-10-09 DIAGNOSIS — S1093XA Contusion of unspecified part of neck, initial encounter: Secondary | ICD-10-CM | POA: Diagnosis not present

## 2013-10-09 DIAGNOSIS — R569 Unspecified convulsions: Secondary | ICD-10-CM | POA: Diagnosis present

## 2013-10-09 DIAGNOSIS — G8929 Other chronic pain: Secondary | ICD-10-CM | POA: Diagnosis present

## 2013-10-09 DIAGNOSIS — R262 Difficulty in walking, not elsewhere classified: Secondary | ICD-10-CM | POA: Diagnosis not present

## 2013-10-09 DIAGNOSIS — Z923 Personal history of irradiation: Secondary | ICD-10-CM

## 2013-10-09 DIAGNOSIS — D432 Neoplasm of uncertain behavior of brain, unspecified: Secondary | ICD-10-CM | POA: Diagnosis not present

## 2013-10-09 DIAGNOSIS — S065XAA Traumatic subdural hemorrhage with loss of consciousness status unknown, initial encounter: Secondary | ICD-10-CM | POA: Diagnosis present

## 2013-10-09 DIAGNOSIS — R220 Localized swelling, mass and lump, head: Secondary | ICD-10-CM | POA: Diagnosis not present

## 2013-10-09 DIAGNOSIS — Z794 Long term (current) use of insulin: Secondary | ICD-10-CM | POA: Diagnosis present

## 2013-10-09 DIAGNOSIS — I6529 Occlusion and stenosis of unspecified carotid artery: Secondary | ICD-10-CM | POA: Diagnosis present

## 2013-10-09 DIAGNOSIS — I739 Peripheral vascular disease, unspecified: Secondary | ICD-10-CM

## 2013-10-09 DIAGNOSIS — M8448XA Pathological fracture, other site, initial encounter for fracture: Secondary | ICD-10-CM | POA: Diagnosis not present

## 2013-10-09 DIAGNOSIS — D332 Benign neoplasm of brain, unspecified: Secondary | ICD-10-CM | POA: Diagnosis present

## 2013-10-09 LAB — URINALYSIS, ROUTINE W REFLEX MICROSCOPIC
Bilirubin Urine: NEGATIVE
Glucose, UA: 250 mg/dL — AB
Hgb urine dipstick: NEGATIVE
Ketones, ur: 15 mg/dL — AB
Leukocytes, UA: NEGATIVE
Nitrite: NEGATIVE
Protein, ur: NEGATIVE mg/dL
Specific Gravity, Urine: 1.023 (ref 1.005–1.030)
Urobilinogen, UA: 0.2 mg/dL (ref 0.0–1.0)
pH: 5.5 (ref 5.0–8.0)

## 2013-10-09 LAB — BASIC METABOLIC PANEL
BUN: 8 mg/dL (ref 6–23)
CO2: 24 mEq/L (ref 19–32)
Calcium: 9.1 mg/dL (ref 8.4–10.5)
Chloride: 100 mEq/L (ref 96–112)
Creatinine, Ser: 0.69 mg/dL (ref 0.50–1.10)
GFR calc Af Amer: >90 mL/min (ref >90)
GFR calc non Af Amer: 79 mL/min — ABNORMAL LOW (ref >90)
Glucose, Bld: 203 mg/dL — ABNORMAL HIGH (ref 70–99)
Potassium: 3.6 mEq/L — ABNORMAL LOW (ref 3.7–5.3)
Sodium: 136 mEq/L — ABNORMAL LOW (ref 137–147)

## 2013-10-09 LAB — CBC WITH DIFFERENTIAL/PLATELET
Basophils Absolute: 0 10*3/uL (ref 0.0–0.1)
Basophils Relative: 0 % (ref 0–1)
Eosinophils Absolute: 0 10*3/uL (ref 0.0–0.7)
Eosinophils Relative: 0 % (ref 0–5)
HCT: 33.8 % — ABNORMAL LOW (ref 36.0–46.0)
Hemoglobin: 11.6 g/dL — ABNORMAL LOW (ref 12.0–15.0)
Lymphocytes Relative: 13 % (ref 12–46)
Lymphs Abs: 1 10*3/uL (ref 0.7–4.0)
MCH: 29.8 pg (ref 26.0–34.0)
MCHC: 34.3 g/dL (ref 30.0–36.0)
MCV: 86.9 fL (ref 78.0–100.0)
Monocytes Absolute: 0.6 10*3/uL (ref 0.1–1.0)
Monocytes Relative: 8 % (ref 3–12)
Neutro Abs: 5.7 10*3/uL (ref 1.7–7.7)
Neutrophils Relative %: 79 % — ABNORMAL HIGH (ref 43–77)
Platelets: 231 10*3/uL (ref 150–400)
RBC: 3.89 MIL/uL (ref 3.87–5.11)
RDW: 13.6 % (ref 11.5–15.5)
WBC: 7.3 10*3/uL (ref 4.0–10.5)

## 2013-10-09 LAB — I-STAT TROPONIN, ED: Troponin i, poc: 0 ng/mL (ref 0.00–0.08)

## 2013-10-09 LAB — CBG MONITORING, ED: Glucose-Capillary: 323 mg/dL — ABNORMAL HIGH (ref 70–99)

## 2013-10-09 SURGERY — LOOP RECORDER IMPLANT
Anesthesia: LOCAL

## 2013-10-09 MED ORDER — ONDANSETRON HCL 4 MG/2ML IJ SOLN: 4.0000 mg | Freq: Four times a day (QID) | INTRAMUSCULAR | Status: DC | PRN

## 2013-10-09 MED ORDER — OXYCODONE HCL 5 MG PO TABS
5.0000 mg | ORAL_TABLET | ORAL | Status: DC | PRN
  Administered 2013-10-10 – 2013-10-13 (×9): 5 mg via ORAL
  Filled 2013-10-09 (×9): qty 1

## 2013-10-09 MED ORDER — FAMOTIDINE 20 MG PO TABS
20.0000 mg | ORAL_TABLET | Freq: Every day | ORAL | Status: DC
  Administered 2013-10-10 – 2013-10-11 (×2): 20 mg via ORAL
  Filled 2013-10-09 (×2): qty 1

## 2013-10-09 MED ORDER — CALCIUM CARBONATE 600 MG PO TABS: 600.0000 mg | ORAL_TABLET | Freq: Every day | ORAL | Status: DC

## 2013-10-09 MED ORDER — VITAMIN D3 25 MCG (1000 UNIT) PO TABS
1000.0000 [IU] | ORAL_TABLET | Freq: Every day | ORAL | Status: DC
  Administered 2013-10-10 – 2013-10-27 (×17): 1000 [IU] via ORAL
  Filled 2013-10-09 (×20): qty 1

## 2013-10-09 MED ORDER — INSULIN ASPART 100 UNIT/ML ~~LOC~~ SOLN
0.0000 [IU] | Freq: Three times a day (TID) | SUBCUTANEOUS | Status: DC
  Administered 2013-10-10: 5 [IU] via SUBCUTANEOUS
  Administered 2013-10-10 (×2): 7 [IU] via SUBCUTANEOUS

## 2013-10-09 MED ORDER — POLYVINYL ALCOHOL 1.4 % OP SOLN
1.0000 [drp] | Freq: Every day | OPHTHALMIC | Status: DC | PRN
  Filled 2013-10-09: qty 15

## 2013-10-09 MED ORDER — FENTANYL CITRATE 0.05 MG/ML IJ SOLN
50.0000 ug | Freq: Once | INTRAMUSCULAR | Status: AC
  Administered 2013-10-09: 50 ug via INTRAVENOUS
  Filled 2013-10-09: qty 2

## 2013-10-09 MED ORDER — OMEGA-3-ACID ETHYL ESTERS 1 G PO CAPS
1.0000 g | ORAL_CAPSULE | Freq: Every day | ORAL | Status: DC
  Administered 2013-10-10 – 2013-10-27 (×17): 1 g via ORAL
  Filled 2013-10-09 (×20): qty 1

## 2013-10-09 MED ORDER — CALCIUM CARBONATE 1250 (500 CA) MG PO TABS
1.0000 | ORAL_TABLET | Freq: Every day | ORAL | Status: DC
  Administered 2013-10-10 – 2013-10-27 (×17): 500 mg via ORAL
  Filled 2013-10-09 (×24): qty 1

## 2013-10-09 MED ORDER — TRANDOLAPRIL 2 MG PO TABS
2.0000 mg | ORAL_TABLET | Freq: Every day | ORAL | Status: DC
  Administered 2013-10-10 – 2013-10-27 (×17): 2 mg via ORAL
  Filled 2013-10-09 (×18): qty 1

## 2013-10-09 MED ORDER — METFORMIN HCL ER 500 MG PO TB24
1000.0000 mg | ORAL_TABLET | Freq: Once | ORAL | Status: AC
  Administered 2013-10-09: 1000 mg via ORAL
  Filled 2013-10-09: qty 2

## 2013-10-09 MED ORDER — VERAPAMIL HCL ER 180 MG PO TBCR
180.0000 mg | EXTENDED_RELEASE_TABLET | Freq: Every day | ORAL | Status: DC
  Administered 2013-10-10 – 2013-10-27 (×18): 180 mg via ORAL
  Filled 2013-10-09 (×18): qty 1

## 2013-10-09 MED ORDER — COLESTIPOL HCL 1 G PO TABS
2.0000 g | ORAL_TABLET | Freq: Two times a day (BID) | ORAL | Status: DC
  Administered 2013-10-09 – 2013-10-13 (×6): 2 g via ORAL
  Filled 2013-10-09 (×12): qty 2

## 2013-10-09 MED ORDER — DEXAMETHASONE SODIUM PHOSPHATE 10 MG/ML IJ SOLN
20.0000 mg | Freq: Once | INTRAMUSCULAR | Status: AC
  Administered 2013-10-09: 20 mg via INTRAVENOUS
  Filled 2013-10-09: qty 2

## 2013-10-09 MED ORDER — LINAGLIPTIN 5 MG PO TABS
5.0000 mg | ORAL_TABLET | Freq: Every day | ORAL | Status: DC
  Administered 2013-10-10: 5 mg via ORAL
  Filled 2013-10-09: qty 1

## 2013-10-09 MED ORDER — SODIUM CHLORIDE 0.9 % IV SOLN
INTRAVENOUS | Status: DC
  Administered 2013-10-09 – 2013-10-12 (×3): via INTRAVENOUS

## 2013-10-09 MED ORDER — LORATADINE 10 MG PO TABS
10.0000 mg | ORAL_TABLET | Freq: Every day | ORAL | Status: DC
  Administered 2013-10-13 – 2013-10-27 (×11): 10 mg via ORAL
  Filled 2013-10-09 (×19): qty 1

## 2013-10-09 MED ORDER — ACETAMINOPHEN 500 MG PO TABS: 1000.0000 mg | ORAL_TABLET | Freq: Four times a day (QID) | ORAL | Status: DC | PRN

## 2013-10-09 MED ORDER — LINAGLIPTIN 5 MG PO TABS
5.0000 mg | ORAL_TABLET | Freq: Every day | ORAL | Status: DC
  Administered 2013-10-09 – 2013-10-12 (×4): 5 mg via ORAL
  Filled 2013-10-09 (×4): qty 1

## 2013-10-09 MED ORDER — FENTANYL CITRATE 0.05 MG/ML IJ SOLN
25.0000 ug | Freq: Once | INTRAMUSCULAR | Status: AC
  Administered 2013-10-09: 25 ug via INTRAVENOUS
  Filled 2013-10-09: qty 2

## 2013-10-09 MED ORDER — ALPRAZOLAM 0.25 MG PO TABS
0.2500 mg | ORAL_TABLET | Freq: Every evening | ORAL | Status: DC | PRN
  Administered 2013-10-11 – 2013-10-20 (×3): 0.25 mg via ORAL
  Filled 2013-10-09 (×3): qty 1

## 2013-10-09 MED ORDER — INSULIN ASPART 100 UNIT/ML ~~LOC~~ SOLN
0.0000 [IU] | Freq: Every day | SUBCUTANEOUS | Status: DC
  Administered 2013-10-09: 4 [IU] via SUBCUTANEOUS
  Filled 2013-10-09: qty 1

## 2013-10-09 MED ORDER — DEXAMETHASONE SODIUM PHOSPHATE 4 MG/ML IJ SOLN
6.0000 mg | Freq: Four times a day (QID) | INTRAMUSCULAR | Status: DC
  Administered 2013-10-09 – 2013-10-12 (×11): 6 mg via INTRAVENOUS
  Filled 2013-10-09 (×6): qty 1.5
  Filled 2013-10-09: qty 2
  Filled 2013-10-09 (×8): qty 1.5

## 2013-10-09 MED ORDER — METFORMIN HCL ER 500 MG PO TB24
1000.0000 mg | ORAL_TABLET | Freq: Every day | ORAL | Status: DC
  Administered 2013-10-10 – 2013-10-12 (×3): 1000 mg via ORAL
  Filled 2013-10-09 (×5): qty 2

## 2013-10-09 MED ORDER — FERROUS SULFATE 325 (65 FE) MG PO TABS
325.0000 mg | ORAL_TABLET | Freq: Every day | ORAL | Status: DC
  Administered 2013-10-10 – 2013-10-27 (×17): 325 mg via ORAL
  Filled 2013-10-09 (×23): qty 1

## 2013-10-09 MED ORDER — OMEGA-3 FATTY ACIDS 1000 MG PO CAPS: 1.0000 g | ORAL_CAPSULE | Freq: Every day | ORAL | Status: DC

## 2013-10-09 MED ORDER — ONDANSETRON HCL 4 MG PO TABS: 4.0000 mg | ORAL_TABLET | Freq: Four times a day (QID) | ORAL | Status: DC | PRN

## 2013-10-09 MED ORDER — LIDOCAINE 5 % EX PTCH
1.0000 | MEDICATED_PATCH | CUTANEOUS | Status: DC
  Administered 2013-10-10 – 2013-10-27 (×18): 1 via TRANSDERMAL
  Filled 2013-10-09 (×25): qty 1

## 2013-10-09 MED ORDER — SITAGLIP PHOS-METFORMIN HCL ER 100-1000 MG PO TB24: 1.0000 | ORAL_TABLET | Freq: Every day | ORAL | Status: DC

## 2013-10-09 MED ORDER — TRANDOLAPRIL-VERAPAMIL HCL ER 2-180 MG PO TBCR: 1.0000 | EXTENDED_RELEASE_TABLET | Freq: Every day | ORAL | Status: DC

## 2013-10-09 NOTE — Consult Note (Signed)
Reason for Consult:sdhReferring Physician: er  Anastasia Tompson Horace is an 80 y.o. female.  HPI: 80 y/o female with history of breast tumor who today had asyncopal episode and fell hitting her head. Work up included a ct head and because of the findings we were called for consult  Past Medical History  Diagnosis Date  . Seasonal allergies   . HTN (hypertension)   . Osteoarthritis   . Palpitations   . Anemia   . History of colonic polyps   . GERD (gastroesophageal reflux disease)   . Hyperlipidemia   . Chronic facial pain   . Vitamin B12 deficiency   . Dyslipidemia   . Chronic foot pain   . Chronic cough   . Obesity   . Anxiety   . Skin cancer   . Complication of anesthesia     Sore jaw; could not chew or move mouth  . Diabetes mellitus     type 2 niddm x 20 years  . Melanoma   . Metatarsal bone fracture 2014  . Hammer toe     bilateral  . Cancer of right breast 12/26/12    right breast 12:00 o'clock, DCIS  . Hx of radiation therapy 03/07/13- 03/29/13    right breast 4250 cGy 17 sessions  . Ejection fraction     Past Surgical History  Procedure Laterality Date  . Colonoscopy    . Polypectomy      small adenomatous  . Abdominal hysterectomy    . Cholecystectomy    . Hernia repair    . Knee arthroscopy Bilateral   . Ulnar tunnel release    . Eye surgery    . Cataract extraction w/ intraocular lens implant Right   . Bunionectomy    . Breast lumpectomy with needle localization Right 01/17/2013    Procedure: BREAST LUMPECTOMY WITH NEEDLE LOCALIZATION;  Surgeon: Edward Jolly, MD;  Location: [place];  Service: General;  Laterality: Right;  . Breast biopsy Right 01/24/2013    Procedure: RE-EXCICION OF BREAST CANCER, ANTERIOR MARGINS;  Surgeon: Edward Jolly, MD;  Location: [facility];  Service: General;  Laterality: Right;    Family History  Problem Relation Age of Onset  . Colon cancer    . Heart disease Mother   . Diabetes Father   . Pancreatic cancer Father   . Bone  cancer Sister   . Prostate cancer Brother   . Colon cancer Brother   . Rectal cancer Sister   . Thyroid disease Sister     benign goiter resected    Social History:  reports that she has never smoked. She does not have any smokeless tobacco history on file. She reports that she does not drink alcohol or use illicit drugs.  Allergies:  Allergies  Allergen Reactions  . Sular [Nisoldipine Er]     "severe headaches, swelling eyes, hands, feet, shortness of breath, weak, flushed face, brain boiling, fluid retention, high blood sugar, nervous, heart fast beating"  . Tegretol [Carbamazepine]     Blood poisoning   . Actonel [Risedronate Sodium]     unknown  . Amlodipine Besylate     unknown  . Aspirin     unknown  . Atacand [Candesartan]     unknown  . Bextra [Valdecoxib]     unknown  . Bisoprolol-Hydrochlorothiazide     uknown  . Bystolic [Nebivolol Hcl]     "extreme weakness, heaviness in legs & arms, swelling in legs/arms/face, swollen abdomen, pain in bladder, feet pain,  soreness in chest"  . Candesartan Cilexetil     unknown  . Cefadroxil     unknown  . Cefdinir Swelling    Vaginal irritation, breathing,   . Cholestyramine     "itching rash on stomach, bloated, nausea, vomiting, sleeplessness, extreme pain in arms"  . Clonidine Other (See Comments)    Dry mouth, fluid retention  . Clonidine Hydrochloride Other (See Comments)    Dry mouth, fluid retention  . Codeine Nausea And Vomiting  . Ezetimibe Other (See Comments)    Made weak  . Hydrazine Yellow [Tartrazine]     "does not reduce high blood pressure, pain in arm, high pressure, felt like I was on verge of heart attack, really weak"  . Hydrocodone     unknown  . Hydrocodone-Acetaminophen     unknown  . Meloxicam     unknown  . Methylprednisolone Sodium Succinate     unknown  . Morphine Other (See Comments)    Feels morbid, weak, still in pain  . Nabumetone     unknown  . Naproxen Other (See Comments)     Shrinks bladder  . Niacin Other (See Comments)    Fast heart beat  . Niaspan [Niacin Er]     "fast heart beat, high blood pressure"  . Nisoldipine     unknown  . Norvasc [Amlodipine Besylate]     "extreme fluid retention/pain)  . Optivar [Azelastine Hcl]     "sensitive to light"  . Penicillins     unknown  . Pseudoephedrine-Guaifenesin     unknown  . Risedronate Sodium     unknown  . Rofecoxib     unknown  . Ru-Tuss [Chlorphen-Pse-Atrop-Hyos-Scop]     unknown  . Sulfonamide Derivatives     unknown  . [place] [Sulfur]     unknown  . Telithromycin     unknown  . Telmisartan     "headache, difficulty urinating, high blood sugar, fluid retention"  . Terfenadine     unknown  . Trandolapril-Verapamil Hcl Er     Headache, difficulty urinating, high blood sugar, fluid retention  . Ziac [Bisoprolol-Hydrochlorothiazide]     "stopped urination"  . Celecoxib Rash    unknown  . Iodinated Diagnostic Agents Rash    "All over"  . Valium [Diazepam] Other (See Comments)    Makes her mean and hyper    Medications:see HP  Results for orders placed during the hospital encounter of 10/09/13 (from the past 48 hour(s))  CBC WITH DIFFERENTIAL     Status: Abnormal   Collection Time    10/09/13  3:51 PM      Result Value Ref Range   WBC 7.3  4.0 - 10.5 K/uL   RBC 3.89  3.87 - 5.11 MIL/uL   Hemoglobin 11.6 (*) 12.0 - 15.0 g/dL   HCT 33.8 (*) 36.0 - 46.0 %   MCV 86.9  78.0 - 100.0 fL   MCH 29.8  26.0 - 34.0 pg   MCHC 34.3  30.0 - 36.0 g/dL   RDW 13.6  11.5 - 15.5 %   Platelets 231  150 - 400 K/uL   Neutrophils Relative % 79 (*) 43 - 77 %   Neutro Abs 5.7  1.7 - 7.7 K/uL   Lymphocytes Relative 13  12 - 46 %   Lymphs Abs 1.0  0.7 - 4.0 K/uL   Monocytes Relative 8  3 - 12 %   Monocytes Absolute 0.6  0.1 - 1.0 K/uL  Eosinophils Relative 0  0 - 5 %   Eosinophils Absolute 0.0  0.0 - 0.7 K/uL   Basophils Relative 0  0 - 1 %   Basophils Absolute 0.0  0.0 - 0.1 K/uL  BASIC METABOLIC  PANEL     Status: Abnormal   Collection Time    10/09/13  3:51 PM      Result Value Ref Range   Sodium 136 (*) 137 - 147 mEq/L   Potassium 3.6 (*) 3.7 - 5.3 mEq/L   Chloride 100  96 - 112 mEq/L   CO2 24  19 - 32 mEq/L   Glucose, Bld 203 (*) 70 - 99 mg/dL   BUN 8  6 - 23 mg/dL   Creatinine, Ser 0.69  0.50 - 1.10 mg/dL   Calcium 9.1  8.4 - 10.5 mg/dL   GFR calc non Af Amer 79 (*) >90 mL/min   GFR calc Af Amer >90  >90 mL/min   Comment: (NOTE)     The eGFR has been calculated using the CKD EPI equation.     This calculation has not been validated in all clinical situations.     eGFR's persistently <90 mL/min signify possible Chronic Kidney     Disease.  Randolm Idol, ED     Status: None   Collection Time    10/09/13  3:56 PM      Result Value Ref Range   Troponin i, poc 0.00  0.00 - 0.08 ng/mL   Comment 3            Comment: Due to the release kinetics of cTnI,     a negative result within the first hours     of the onset of symptoms does not rule out     myocardial infarction with certainty.     If myocardial infarction is still suspected,     repeat the test at appropriate intervals.  URINALYSIS, ROUTINE W REFLEX MICROSCOPIC     Status: Abnormal   Collection Time    10/09/13  5:26 PM      Result Value Ref Range   Color, Urine YELLOW  YELLOW   APPearance CLEAR  CLEAR   Specific Gravity, Urine 1.023  1.005 - 1.030   pH 5.5  5.0 - 8.0   Glucose, UA 250 (*) NEGATIVE mg/dL   Hgb urine dipstick NEGATIVE  NEGATIVE   Bilirubin Urine NEGATIVE  NEGATIVE   Ketones, ur 15 (*) NEGATIVE mg/dL   Protein, ur NEGATIVE  NEGATIVE mg/dL   Urobilinogen, UA 0.2  0.0 - 1.0 mg/dL   Nitrite NEGATIVE  NEGATIVE   Leukocytes, UA NEGATIVE  NEGATIVE   Comment: MICROSCOPIC NOT DONE ON URINES WITH NEGATIVE PROTEIN, BLOOD, LEUKOCYTES, NITRITE, OR GLUCOSE <1000 mg/dL.    Dg Lumbar Spine Complete  10/09/2013   CLINICAL DATA:  Fall  EXAM: LUMBAR SPINE - COMPLETE 4+ VIEW  COMPARISON:  Chest  radiograph dated 08/06/2013  FINDINGS: New T12 wedge compression deformity with 50% loss of height anteriorly and some loss of height of the posterior margin. No obvious retropulsion. Osteopenia. Severe narrowing of the L4-5 disc with endplate changes and vacuum.  IMPRESSION: New T12 compression fracture as described.   Electronically Signed   By: Maryclare Bean M.D.   On: 10/09/2013 16:47   Ct Head Wo Contrast  10/09/2013   CLINICAL DATA:  80 year old female status post fall with head injury. Bruises and pain. Initial encounter.  EXAM: CT HEAD WITHOUT CONTRAST  TECHNIQUE: Contiguous axial  images were obtained from the base of the skull through the vertex without intravenous contrast.  COMPARISON:  Neck CT 12/15/2006.  FINDINGS: Visualized paranasal sinuses and mastoids are clear. There is superficial right periorbital soft tissue stranding, mostly pre female are (series 3, image 6). Right orbital walls appear intact. Postoperative changes to the right globe with otherwise negative visualized right intraorbital soft tissues.  Negative left orbit and other scalp soft tissues.  Calvarium intact.  Hyperdense extra-axial hemorrhage along the right convexity anteriorly focally measuring up to 9 mm in thickness (series 2, image 24), does appear mildly lobulated but overall most resembles subdural hematoma.  There is some mass effect on the right hemisphere related this finding, but also and isodense to slightly hyperdense brain mass affecting the anterior right frontal lobe (series 2, images 15 and 16), measuring at least 46 mm diameter. There is surrounding white matter hypodensity in a vasogenic edema pattern.  Subsequently there is leftward midline shift of 6 mm at the septum callosum. No ventriculomegaly. Basilar cisterns are patent. Elsewhere normal gray-white matter differentiation. No other acute intracranial hemorrhage identified.  IMPRESSION: 1. Acute, posttraumatic extra-axial hemorrhage along the anterior right  convexity, favor subdural hematoma and measuring up to 9 mm in thickness. 2. Mass effect and vasogenic edema also in the anterior right frontal lobe which appears related to a large inferior right frontal lobe mass. Favor large extra-axial meningioma, Brain MRI without and with contrast would confirm. 3. Subsequent leftward midline shift of 6 mm. 4. No acute fracture identified. Mild soft tissue injury about the right orbit. Critical Value/emergent results were called by telephone at the time of interpretation on 10/09/2013 at 4:52 PM to Dr. Sherwood Gambler , who verbally acknowledged these results.   Electronically Signed   By: Lars Pinks M.D.   On: 10/09/2013 16:54    Review of Systems  Constitutional: Negative.   Eyes: Positive for pain.  Respiratory: Negative.   Cardiovascular: Negative.   Gastrointestinal: Negative.   Genitourinary: Negative.   Musculoskeletal: Negative.   Skin: Negative.   Neurological: Positive for dizziness and headaches.  Endo/Heme/Allergies: Negative.   Psychiatric/Behavioral: Negative.    Blood pressure 169/89, pulse 77, temperature 97.5 F (36.4 C), temperature source Oral, resp. rate 22, SpO2 94.00%. Physical Exam hent, right eye with periorbital hematoma. Tenderness in the area. No blood or csf in ears or nose. Neck, nl. Cv, nl. Lungs, clear. Abdomen, soft. Extremities, nl. NEURO alert, oriented x 3. Cn normal except the right pupil is 1 mm smaller than the left. Normal strength. Ct head shows a small fronta sdh but there is a mass in the frontal lobe close to the falx with surrounding edema  Assessment/Plan: Started on decadron. To be transferred to [place]. Will do a mri brain tonite or tomorrow.I spoke with the patient about this. With her and brother. Will f/u  Floyce Stakes 10/09/2013, 8:42 PM

## 2013-10-09 NOTE — ED Provider Notes (Signed)
CSN: [id number]     Arrival date & time 10/09/13  1452 History   First MD Initiated Contact with Patient 10/09/13 1527     Chief Complaint  Patient presents with  . Fall     (Consider location/radiation/quality/duration/timing/severity/associated sxs/prior Treatment) HPI 80 year old female presents after being unable to get off of the floor due to back pain. Couple days ago she fell in her house and injured her back and her right head. She thinks she Plata passed out. She's had multiple syncope episodes over the last year. She's had workup as an outpatient by her PCP and has seen cardiology. She was supposed to get a heart monitor today however the patient was unable to get off the floor to get to the appointment. She denies any chest pain or shortness of breath. No abdominal pain. She rates the back pain as a 9 and was unrelieved by Tylenol. Denies any headaches or vomiting. Family didn't side relates that she is at her normal mental status. She's not been confused during these episodes but this seemed to black out for about a minute. She was seen in the ER 2 months ago for these episodes and declined admission. Since then she's had an echocardiogram apparently is not know the results. Patient denies any lower extremity weakness or any incontinence.  Past Medical History  Diagnosis Date  . Seasonal allergies   . HTN (hypertension)   . Osteoarthritis   . Palpitations   . Anemia   . History of colonic polyps   . GERD (gastroesophageal reflux disease)   . Hyperlipidemia   . Chronic facial pain   . Vitamin B12 deficiency   . Dyslipidemia   . Chronic foot pain   . Chronic cough   . Obesity   . Anxiety   . Skin cancer   . Complication of anesthesia     Sore jaw; could not chew or move mouth  . Diabetes mellitus     type 2 niddm x 20 years  . Melanoma   . Metatarsal bone fracture 2014  . Hammer toe     bilateral  . Cancer of right breast 12/26/12    right breast 12:00 o'clock, DCIS  .  Hx of radiation therapy 03/07/13- 03/29/13    right breast 4250 cGy 17 sessions  . Ejection fraction    Past Surgical History  Procedure Laterality Date  . Colonoscopy    . Polypectomy      small adenomatous  . Abdominal hysterectomy    . Cholecystectomy    . Hernia repair    . Knee arthroscopy Bilateral   . Ulnar tunnel release    . Eye surgery    . Cataract extraction w/ intraocular lens implant Right   . Bunionectomy    . Breast lumpectomy with needle localization Right 01/17/2013    Procedure: BREAST LUMPECTOMY WITH NEEDLE LOCALIZATION;  Surgeon: Edward Jolly, MD;  Location: [place];  Service: General;  Laterality: Right;  . Breast biopsy Right 01/24/2013    Procedure: RE-EXCICION OF BREAST CANCER, ANTERIOR MARGINS;  Surgeon: Edward Jolly, MD;  Location: [facility];  Service: General;  Laterality: Right;   Family History  Problem Relation Age of Onset  . Colon cancer    . Heart disease Mother   . Diabetes Father   . Pancreatic cancer Father   . Bone cancer Sister   . Prostate cancer Brother   . Colon cancer Brother   . Rectal cancer Sister   .  Thyroid disease Sister     benign goiter resected   History  Substance Use Topics  . Smoking status: Never Smoker   . Smokeless tobacco: Not on file  . Alcohol Use: No   OB History   Grav Para Term Preterm Abortions TAB SAB Ect Mult Living                 Review of Systems  Constitutional: Negative for fever.  Respiratory: Negative for shortness of breath.   Cardiovascular: Negative for chest pain.  Gastrointestinal: Negative for vomiting and abdominal pain.  Genitourinary: Negative for dysuria.  Musculoskeletal: Positive for back pain.  Neurological: Positive for syncope. Negative for weakness, numbness and headaches.  All other systems reviewed and are negative.     Allergies  Sular; Tegretol; Actonel; Amlodipine besylate; Aspirin; Atacand; Bextra; Bisoprolol-hydrochlorothiazide; Bystolic; Candesartan  cilexetil; Cefadroxil; Cefdinir; Cholestyramine; Clonidine; Clonidine hydrochloride; Codeine; Ezetimibe; Hydrazine yellow; Hydrocodone; Hydrocodone-acetaminophen; Meloxicam; Methylprednisolone sodium succinate; Morphine; Nabumetone; Naproxen; Niacin; Niaspan; Nisoldipine; Norvasc; Optivar; Penicillins; Pseudoephedrine-guaifenesin; Risedronate sodium; Rofecoxib; Ru-tuss; Sulfonamide derivatives; [place]; Telithromycin; Telmisartan; Terfenadine; Trandolapril-verapamil hcl er; Ziac; Celecoxib; Iodinated diagnostic agents; and Valium  Home Medications   Prior to Admission medications   Medication Sig Start Date End Date Taking? Authorizing Provider  acetaminophen (TYLENOL) 500 MG tablet Take 1,000 mg by mouth every 6 (six) hours as needed for pain.   Yes Historical Provider, MD  ALPRAZolam Duanne Moron) 0.25 MG tablet Take 0.25 mg by mouth at bedtime as needed for anxiety. 02/13/11  Yes Ricard Dillon, MD  calcium carbonate (OS-CAL) 600 MG TABS Take 600 mg by mouth daily.    Yes Historical Provider, MD  cholecalciferol (VITAMIN D) 1000 UNITS tablet Take 1,000 Units by mouth daily.    Yes Historical Provider, MD  colestipol (COLESTID) 1 G tablet Take 2 g by mouth 2 (two) times daily.   Yes Historical Provider, MD  cyanocobalamin (,VITAMIN B-12,) 1000 MCG/ML injection Inject 1 mL (1,000 mcg total) into the muscle every 21 ( twenty-one) days. 03/14/13  Yes Ricard Dillon, MD  ferrous sulfate 325 (65 FE) MG tablet Take 325 mg by mouth daily with breakfast.    Yes Historical Provider, MD  fexofenadine (ALLEGRA) 60 MG tablet Take 60 mg by mouth daily.    Yes Historical Provider, MD  fish oil-omega-3 fatty acids 1000 MG capsule Take 1 g by mouth daily.    Yes Historical Provider, MD  lidocaine (LIDODERM) 5 % Place 1 patch onto the skin daily. Remove & Discard patch within 12 hours or as directed by MD 08/07/13  Yes Timoteo Gaul, FNP  polyvinyl alcohol (LIQUIFILM TEARS) 1.4 % ophthalmic solution Place 1 drop into  both eyes daily as needed for dry eyes.    Yes Historical Provider, MD  ranitidine (ZANTAC) 150 MG capsule Take 150 mg by mouth daily as needed for heartburn.    Yes Historical Provider, MD  SitaGLIPtin-MetFORMIN HCl (JANUMET XR) [phone] MG TB24 Take 1 tablet by mouth daily. 02/21/13  Yes Ricard Dillon, MD  trandolapril-verapamil (TARKA) 2-180 MG per tablet Take 1 tablet by mouth daily.   Yes Historical Provider, MD   BP 161/85  Pulse 67  Temp(Src) 97.6 F (36.4 C) (Oral)  Resp 18  SpO2 95% Physical Exam  Nursing note and vitals reviewed. Constitutional: She is oriented to person, place, and time. She appears well-developed and well-nourished.  HENT:  Head: Normocephalic.    Right Ear: External ear normal.  Left Ear: External ear normal.  Nose:  Nose normal.  Eyes: EOM are normal. Pupils are equal, round, and reactive to light. Right eye exhibits no discharge. Left eye exhibits no discharge.  Cardiovascular: Normal rate, regular rhythm and normal heart sounds.   Pulmonary/Chest: Effort normal and breath sounds normal.  Abdominal: Soft. There is no tenderness.  Musculoskeletal:       Lumbar back: She exhibits tenderness (mild).  Neurological: She is alert and oriented to person, place, and time.  Normal strength in all 4 extremities  Skin: Skin is warm and dry.    ED Course  Procedures (including critical care time) Labs Review Labs Reviewed  CBC WITH DIFFERENTIAL - Abnormal; Notable for the following:    Hemoglobin 11.6 (*)    HCT 33.8 (*)    Neutrophils Relative % 79 (*)    All other components within normal limits  BASIC METABOLIC PANEL  I-STAT TROPOININ, ED    Imaging Review Dg Lumbar Spine Complete  10/09/2013   CLINICAL DATA:  Fall  EXAM: LUMBAR SPINE - COMPLETE 4+ VIEW  COMPARISON:  Chest radiograph dated 08/06/2013  FINDINGS: New T12 wedge compression deformity with 50% loss of height anteriorly and some loss of height of the posterior margin. No obvious  retropulsion. Osteopenia. Severe narrowing of the L4-5 disc with endplate changes and vacuum.  IMPRESSION: New T12 compression fracture as described.   Electronically Signed   By: Maryclare Bean M.D.   On: 10/09/2013 16:47   Ct Head Wo Contrast  10/09/2013   CLINICAL DATA:  80 year old female status post fall with head injury. Bruises and pain. Initial encounter.  EXAM: CT HEAD WITHOUT CONTRAST  TECHNIQUE: Contiguous axial images were obtained from the base of the skull through the vertex without intravenous contrast.  COMPARISON:  Neck CT 12/15/2006.  FINDINGS: Visualized paranasal sinuses and mastoids are clear. There is superficial right periorbital soft tissue stranding, mostly pre female are (series 3, image 6). Right orbital walls appear intact. Postoperative changes to the right globe with otherwise negative visualized right intraorbital soft tissues.  Negative left orbit and other scalp soft tissues.  Calvarium intact.  Hyperdense extra-axial hemorrhage along the right convexity anteriorly focally measuring up to 9 mm in thickness (series 2, image 24), does appear mildly lobulated but overall most resembles subdural hematoma.  There is some mass effect on the right hemisphere related this finding, but also and isodense to slightly hyperdense brain mass affecting the anterior right frontal lobe (series 2, images 15 and 16), measuring at least 46 mm diameter. There is surrounding white matter hypodensity in a vasogenic edema pattern.  Subsequently there is leftward midline shift of 6 mm at the septum callosum. No ventriculomegaly. Basilar cisterns are patent. Elsewhere normal gray-white matter differentiation. No other acute intracranial hemorrhage identified.  IMPRESSION: 1. Acute, posttraumatic extra-axial hemorrhage along the anterior right convexity, favor subdural hematoma and measuring up to 9 mm in thickness. 2. Mass effect and vasogenic edema also in the anterior right frontal lobe which appears related to  a large inferior right frontal lobe mass. Favor large extra-axial meningioma, Brain MRI without and with contrast would confirm. 3. Subsequent leftward midline shift of 6 mm. 4. No acute fracture identified. Mild soft tissue injury about the right orbit. Critical Value/emergent results were called by telephone at the time of interpretation on 10/09/2013 at 4:52 PM to Dr. Sherwood Gambler , who verbally acknowledged these results.   Electronically Signed   By: Lars Pinks M.D.   On: 10/09/2013 16:54  Date: 10/09/2013  Rate: 73  Rhythm: normal sinus rhythm  QRS Axis: left  Intervals: normal  ST/T Wave abnormalities: nonspecific T wave changes  Conduction Disutrbances:none  Narrative Interpretation:   Old EKG Reviewed: unchanged    MDM   Final diagnoses:  T12 compression fracture  Frontal mass of brain    Patient is neurologically intact here and in no distress. No AMS. CT here shows concern for meningioma vs met given breast cancer history. Has some surrounding edema and also a subdural, likely from fall 2 days ago. Has T12 fracture as well. Discussed CT results with Dr. Joya Salm, who recommends 20 mg decadron IV, and will need MRI. Would like hospitalist to admit to [facility], will transfer via [place].    Ephraim Hamburger, MD 10/10/13 [phone]

## 2013-10-09 NOTE — ED Notes (Addendum)
Per EMS- fell Saturday and was able to move off of floor. C/o diffuse back pain that feels like muscle tightening. Increased weakness. Missed appt at [facility] for internal heart monitor d/t continual syncopal episodes. Slid out of bed into floor this morning. A&Ox4 for EMS. Bruise over right eye from fall Saturday. VS: BP 150/100 HR 70 CBG 236. C/o 8/10 pain. Has not eaten today or taken any of meds. Hx DM II.

## 2013-10-09 NOTE — Progress Notes (Signed)
Clinical Social Work Department BRIEF PSYCHOSOCIAL ASSESSMENT 10/09/2013  Patient:  [name], [name]     Account Number:  [id number]     Admit date:  10/09/2013  Clinical Social Worker:  Luretha Rued  Date/Time:  10/09/2013 05:40 PM  Referred by:  CSW  Date Referred:  10/09/2013  Other Referral:   Interview type:  Patient Other interview type:   sister at bedside    PSYCHOSOCIAL DATA Living Status:  ALONE Admitted from facility:   Level of care:   Primary support name:  [name] Primary support relationship to patient:  SIBLING Degree of support available:   High    CURRENT CONCERNS  Other Concerns:    SOCIAL WORK ASSESSMENT / PLAN CSW met with patient and family at bedside to complete this assessment.  Patient presents as alert, oreint x4, cooperative, and calm.  Patient reports she does not have a POA and do not want one at this time.  Patient reports 6 falls in the past year which resulted in a couple broken foot bones and a cracked vertibre.  Patient was found today by her sister after falling by her sister on the floor in her home.  Patient do not current have services and is not clear what services she Gaus need.  Patient want to wait until she is transferred and talk to the surgeon to get a better understanding of the potential limitations of the surgery than talk to the social worker about services.   Assessment/plan status:  Psychosocial Support/Ongoing Assessment of Needs Other assessment/ plan:   Information/referral to community resources:   None at this time    PATIENT'S/FAMILY'S RESPONSE TO PLAN OF CARE: Patient and family expressed thier appreciation of the support provided by the social work department.     Chesley Noon, MSW, [place], 10/09/2013 Evening Clinical Social Worker [phone]

## 2013-10-09 NOTE — H&P (Addendum)
History and Physical:    [name] VHQ:[id number] DOB: [birth year] DOA: 10/09/2013  Referring physician: Dr. Regenia Skeeter PCP: Georgetta Haber, MD   Chief Complaint: Syncope/fall.  History of Present Illness:   [name] is an 80 y.o. female with a PMH of breast cancer (DCIS) status post right lumpectomy and postlumpectomy radiation therapy, recurrent syncope evaluated by cardiology with plans to have a ILR inserted at 12:00 today, but fell around 9:30 a.m. And couldn't get up, was found by family around 1:00 pm on the floor.  EMS brought her to [facility] for further evaluation.  The patient reports that she also fell 2 days ago, sustaining head trauma (right sided) and trauma to her back.  She has had back pain and headache since the fall 2 days ago.    ROS:   Constitutional: No fever, no chills;  Appetite normal; No weight loss, no weight gain, + fatigue.  HEENT: No blurry vision, no diplopia, no pharyngitis, no dysphagia CV: No chest pain, no palpitations, no PND, no orthopnea, no edema.  Resp: No SOB, + dry cough, no pleuritic pain. GI: No nausea, no vomiting, no diarrhea, no melena, no hematochezia, no constipation, no abdominal pain.  GU: No dysuria, no hematuria, no frequency, no urgency. MSK: no myalgias, + knee/back arthralgias.  Neuro:  + headache, no focal neurological deficits, + history of drug induced seizure (tegretol).  Psych: No depression, + anxiety.  Endo: No heat intolerance, no cold intolerance, no polyuria, no polydipsia  Skin: No rashes, + skin lesions.  Heme: No easy bruising.  Travel history: No recent travel.   Past Medical History:   Past Medical History  Diagnosis Date  . Seasonal allergies   . HTN (hypertension)   . Osteoarthritis   . Palpitations   . Anemia   . History of colonic polyps   . GERD (gastroesophageal reflux disease)   . Hyperlipidemia   . Chronic facial pain   . Vitamin B12 deficiency   . Dyslipidemia   . Chronic foot pain   . Chronic cough     . Obesity   . Anxiety   . Skin cancer   . Complication of anesthesia     Sore jaw; could not chew or move mouth  . Diabetes mellitus     type 2 niddm x 20 years  . Melanoma   . Metatarsal bone fracture 2014  . Hammer toe     bilateral  . Cancer of right breast 12/26/12    right breast 12:00 o'clock, DCIS  . Hx of radiation therapy 03/07/13- 03/29/13    right breast 4250 cGy 17 sessions  . Ejection fraction     Past Surgical History:   Past Surgical History  Procedure Laterality Date  . Colonoscopy    . Polypectomy      small adenomatous  . Abdominal hysterectomy    . Cholecystectomy    . Hernia repair    . Knee arthroscopy Bilateral   . Ulnar tunnel release    . Eye surgery    . Cataract extraction w/ intraocular lens implant Right   . Bunionectomy    . Breast lumpectomy with needle localization Right 01/17/2013    Procedure: BREAST LUMPECTOMY WITH NEEDLE LOCALIZATION;  Surgeon: Edward Jolly, MD;  Location: [facility];  Service: General;  Laterality: Right;  . Breast biopsy Right 01/24/2013    Procedure: RE-EXCICION OF BREAST CANCER, ANTERIOR MARGINS;  Surgeon: Edward Jolly, MD;  Location: [facility];  Service: General;  Laterality: Right;    Social History:   History   Social History  . Marital Status: Widowed    Spouse Name: N/A    Number of Children: 0  . Years of Education: N/A   Occupational History  . retired    Social History Main Topics  . Smoking status: Never Smoker   . Smokeless tobacco: Not on file  . Alcohol Use: No  . Drug Use: No  . Sexual Activity: Yes     Comment: menarche age 29, hysterectomy age 76, HRT x 2-3 mos, G1- miscarriage   Other Topics Concern  . Not on file   Social History Narrative   Widowed, lives alone.  Ambulates independently.     Family history:   Family History  Problem Relation Age of Onset  . Colon cancer    . Heart disease Mother   . Diabetes Father   . Pancreatic cancer Father   . Bone cancer Sister    . Prostate cancer Brother   . Colon cancer Brother   . Rectal cancer Sister   . Thyroid disease Sister     benign goiter resected    Allergies   Sular; Tegretol; Actonel; Amlodipine besylate; Aspirin; Atacand; Bextra; Bisoprolol-hydrochlorothiazide; Bystolic; Candesartan cilexetil; Cefadroxil; Cefdinir; Cholestyramine; Clonidine; Clonidine hydrochloride; Codeine; Ezetimibe; Hydrazine yellow; Hydrocodone; Hydrocodone-acetaminophen; Meloxicam; Methylprednisolone sodium succinate; Morphine; Nabumetone; Naproxen; Niacin; Niaspan; Nisoldipine; Norvasc; Optivar; Penicillins; Pseudoephedrine-guaifenesin; Risedronate sodium; Rofecoxib; Ru-tuss; Sulfonamide derivatives; Linden; Telithromycin; Telmisartan; Terfenadine; Trandolapril-verapamil hcl er; Ziac; Celecoxib; Iodinated diagnostic agents; and Valium  Current Medications:   Prior to Admission medications   Medication Sig Start Date End Date Taking? Authorizing Provider  acetaminophen (TYLENOL) 500 MG tablet Take 1,000 mg by mouth every 6 (six) hours as needed for pain.   Yes Historical Provider, MD  ALPRAZolam Duanne Moron) 0.25 MG tablet Take 0.25 mg by mouth at bedtime as needed for anxiety. 02/13/11  Yes Ricard Dillon, MD  calcium carbonate (OS-CAL) 600 MG TABS Take 600 mg by mouth daily.    Yes Historical Provider, MD  cholecalciferol (VITAMIN D) 1000 UNITS tablet Take 1,000 Units by mouth daily.    Yes Historical Provider, MD  colestipol (COLESTID) 1 G tablet Take 2 g by mouth 2 (two) times daily.   Yes Historical Provider, MD  cyanocobalamin (,VITAMIN B-12,) 1000 MCG/ML injection Inject 1 mL (1,000 mcg total) into the muscle every 21 ( twenty-one) days. 03/14/13  Yes Ricard Dillon, MD  ferrous sulfate 325 (65 FE) MG tablet Take 325 mg by mouth daily with breakfast.    Yes Historical Provider, MD  fexofenadine (ALLEGRA) 60 MG tablet Take 60 mg by mouth daily.    Yes Historical Provider, MD  fish oil-omega-3 fatty acids 1000 MG capsule Take 1 g  by mouth daily.    Yes Historical Provider, MD  lidocaine (LIDODERM) 5 % Place 1 patch onto the skin daily. Remove & Discard patch within 12 hours or as directed by MD 08/07/13  Yes Timoteo Gaul, FNP  polyvinyl alcohol (LIQUIFILM TEARS) 1.4 % ophthalmic solution Place 1 drop into both eyes daily as needed for dry eyes.    Yes Historical Provider, MD  ranitidine (ZANTAC) 150 MG capsule Take 150 mg by mouth daily as needed for heartburn.    Yes Historical Provider, MD  SitaGLIPtin-MetFORMIN HCl (JANUMET XR) [phone] MG TB24 Take 1 tablet by mouth daily. 02/21/13  Yes Ricard Dillon, MD  trandolapril-verapamil (TARKA) 2-180 MG per tablet Take 1 tablet by  mouth daily.   Yes Historical Provider, MD    Physical Exam:   Filed Vitals:   10/09/13 1535 10/09/13 1536 10/09/13 1719 10/09/13 1727  BP:  161/85 175/80 175/80  Pulse: 70 67 74 72  Temp:  97.6 F (36.4 C)  98 F (36.7 C)  TempSrc:  Oral  Oral  Resp: 22 18 17 16   SpO2: 96% 95% 97% 97%     Physical Exam: Blood pressure 175/80, pulse 72, temperature 98 F (36.7 C), temperature source Oral, resp. rate 16, SpO2 97.00%. Gen: No acute distress. Head: Normocephalic, right periorbital hematoma. Eyes: Anisocoria, left pupil larger, EOMI, sclerae nonicteric. Mouth: Oropharynx with dry mucous membranes. Neck: Supple, no thyromegaly, no lymphadenopathy, no jugular venous distention. Chest: Lungs CTAB. CV: Heart sounds R/R/R, no M/R/G. Abdomen: Soft, nontender, nondistended with normal active bowel sounds. Extremities: Extremities without C/E/C. Skin: Warm and dry. Neuro: Alert and oriented times 3; Pupillary changes noted above.  Generalized weakness, no focal motor deficits. Psych: Mood and affect normal.   Data Review:    Labs: Basic Metabolic Panel:  Recent Labs Lab 10/09/13 1551  NA 136*  K 3.6*  CL 100  CO2 24  GLUCOSE 203*  BUN 8  CREATININE 0.69  CALCIUM 9.1   CBC:  Recent Labs Lab 10/09/13 1551  WBC 7.3    NEUTROABS 5.7  HGB 11.6*  HCT 33.8*  MCV 86.9  PLT 231    Radiographic Studies: Dg Lumbar Spine Complete  10/09/2013   CLINICAL DATA:  Fall  EXAM: LUMBAR SPINE - COMPLETE 4+ VIEW  COMPARISON:  Chest radiograph dated 08/06/2013  FINDINGS: New T12 wedge compression deformity with 50% loss of height anteriorly and some loss of height of the posterior margin. No obvious retropulsion. Osteopenia. Severe narrowing of the L4-5 disc with endplate changes and vacuum.  IMPRESSION: New T12 compression fracture as described.   Electronically Signed   By: Maryclare Bean M.D.   On: 10/09/2013 16:47   Ct Head Wo Contrast  10/09/2013   CLINICAL DATA:  80 year old female status post fall with head injury. Bruises and pain. Initial encounter.  EXAM: CT HEAD WITHOUT CONTRAST  TECHNIQUE: Contiguous axial images were obtained from the base of the skull through the vertex without intravenous contrast.  COMPARISON:  Neck CT 12/15/2006.  FINDINGS: Visualized paranasal sinuses and mastoids are clear. There is superficial right periorbital soft tissue stranding, mostly pre female are (series 3, image 6). Right orbital walls appear intact. Postoperative changes to the right globe with otherwise negative visualized right intraorbital soft tissues.  Negative left orbit and other scalp soft tissues.  Calvarium intact.  Hyperdense extra-axial hemorrhage along the right convexity anteriorly focally measuring up to 9 mm in thickness (series 2, image 24), does appear mildly lobulated but overall most resembles subdural hematoma.  There is some mass effect on the right hemisphere related this finding, but also and isodense to slightly hyperdense brain mass affecting the anterior right frontal lobe (series 2, images 15 and 16), measuring at least 46 mm diameter. There is surrounding white matter hypodensity in a vasogenic edema pattern.  Subsequently there is leftward midline shift of 6 mm at the septum callosum. No ventriculomegaly. Basilar  cisterns are patent. Elsewhere normal gray-white matter differentiation. No other acute intracranial hemorrhage identified.  IMPRESSION: 1. Acute, posttraumatic extra-axial hemorrhage along the anterior right convexity, favor subdural hematoma and measuring up to 9 mm in thickness. 2. Mass effect and vasogenic edema also in the anterior right frontal lobe  which appears related to a large inferior right frontal lobe mass. Favor large extra-axial meningioma, Brain MRI without and with contrast would confirm. 3. Subsequent leftward midline shift of 6 mm. 4. No acute fracture identified. Mild soft tissue injury about the right orbit. Critical Value/emergent results were called by telephone at the time of interpretation on 10/09/2013 at 4:52 PM to Dr. Sherwood Gambler , who verbally acknowledged these results.   Electronically Signed   By: Lars Pinks M.D.   On: 10/09/2013 16:54    EKG: Independently reviewed. Sinus rhythm at 73 BPM. Atrial premature complex.  Abnormal R-wave progression, late transition. Inferior infarct, old. Lateral leads are also involved.   Assessment/Plan:   Principal Problem: Subdural hemorrhage / right frontal lobe mass  SDH secondary to trauma, but CT showed an incidentally discovered right frontal lobe mass.  Decadron 20 mg X 1 followed by 6 mg Q 6 hours.  MRI w/wo contrast to further characterize.  Neurosurgery consulted.  Neuro checks Q 2 hours.  Active Problems: T 12 compression fracture  Pain control.  PT/OT when stable.  DM w/o Complication Type II  Continue Janumet XR.  SSI Q AC/HS.  HYPERLIPIDEMIA  Continue Colestid.  HYPERTENSION  Continue Tarka.  GERD  Continue H2 blocker.  Syncope  Outpatient loop recorder on discharge.  Anxiety  Continue Xanax.  DVT prophylaxis  SCDs.   Code Status: Full. Family Communication: Nephew at bedside. Disposition Plan: Home when stable.  Time spent: 1 hour.  [facility] Pager [phone] Cell: [phone]   If 7PM-7AM, please contact night-coverage [email or web address] Password [email or web address] 10/09/2013, 6:15 PM    **Disclaimer: This note was dictated with voice recognition software. Similar sounding words can inadvertently be transcribed and this note Borges contain transcription errors which Frerking not have been corrected upon publication of note.**

## 2013-10-09 NOTE — ED Notes (Signed)
Attempted calling report to [facility]. RN will return call for report shortly.

## 2013-10-09 NOTE — ED Notes (Signed)
Initial contact- pt A&O x4. In NAD. Patient's account matches with EMS history. Hooked up to EKG immediately. PIV placed and labs drawn. Patient does have right red/purple bruise over right eye. Denies HA today and denies vision changes. No other complaints at this time. Able to move all extremities with strength.

## 2013-10-09 NOTE — ED Notes (Signed)
All patient belongings sent with patient and [facility].

## 2013-10-09 NOTE — ED Notes (Signed)
Bed: WA20 Expected date:  Expected time:  Means of arrival:  Comments: EMS- 80yr old, fall, back pain

## 2013-10-09 NOTE — ED Notes (Signed)
C spine cleared per request from [facility]. Per MD Regenia Skeeter.

## 2013-10-09 NOTE — ED Notes (Signed)
Pt to CT and Xray

## 2013-10-09 NOTE — ED Notes (Signed)
MD Regenia Skeeter ok to give Janumet. Pharmacy does not carry this specific medication. Pharmacist to order medication with same component.

## 2013-10-10 ENCOUNTER — Inpatient Hospital Stay (HOSPITAL_COMMUNITY): Payer: Medicare Other

## 2013-10-10 DIAGNOSIS — D649 Anemia, unspecified: Secondary | ICD-10-CM

## 2013-10-10 DIAGNOSIS — R51 Headache: Secondary | ICD-10-CM

## 2013-10-10 DIAGNOSIS — F411 Generalized anxiety disorder: Secondary | ICD-10-CM

## 2013-10-10 DIAGNOSIS — I10 Essential (primary) hypertension: Secondary | ICD-10-CM

## 2013-10-10 DIAGNOSIS — E785 Hyperlipidemia, unspecified: Secondary | ICD-10-CM

## 2013-10-10 LAB — BASIC METABOLIC PANEL
BUN: 9 mg/dL (ref 6–23)
CO2: 22 mEq/L (ref 19–32)
Calcium: 9 mg/dL (ref 8.4–10.5)
Chloride: 93 mEq/L — ABNORMAL LOW (ref 96–112)
Creatinine, Ser: 0.6 mg/dL (ref 0.50–1.10)
GFR calc Af Amer: >90 mL/min (ref >90)
GFR calc non Af Amer: 83 mL/min — ABNORMAL LOW (ref >90)
Glucose, Bld: 325 mg/dL — ABNORMAL HIGH (ref 70–99)
Potassium: 3.9 mEq/L (ref 3.7–5.3)
Sodium: 132 mEq/L — ABNORMAL LOW (ref 137–147)

## 2013-10-10 LAB — CBC
HCT: 35.1 % — ABNORMAL LOW (ref 36.0–46.0)
Hemoglobin: 12.1 g/dL (ref 12.0–15.0)
MCH: 30 pg (ref 26.0–34.0)
MCHC: 34.5 g/dL (ref 30.0–36.0)
MCV: 86.9 fL (ref 78.0–100.0)
Platelets: 219 10*3/uL (ref 150–400)
RBC: 4.04 MIL/uL (ref 3.87–5.11)
RDW: 13.5 % (ref 11.5–15.5)
WBC: 8.2 10*3/uL (ref 4.0–10.5)

## 2013-10-10 LAB — HEPATIC FUNCTION PANEL
ALT: 10 U/L (ref 0–35)
AST: 11 U/L (ref 0–37)
Albumin: 3.5 g/dL (ref 3.5–5.2)
Alkaline Phosphatase: 72 U/L (ref 39–117)
Bilirubin, Direct: <0.2 mg/dL (ref 0.0–0.3)
Total Bilirubin: 0.5 mg/dL (ref 0.3–1.2)
Total Protein: 6.6 g/dL (ref 6.0–8.3)

## 2013-10-10 LAB — GLUCOSE, CAPILLARY: Glucose-Capillary: 327 mg/dL — ABNORMAL HIGH (ref 70–99)

## 2013-10-10 LAB — MRSA PCR SCREENING: MRSA by PCR: NEGATIVE

## 2013-10-10 MED ORDER — INSULIN ASPART 100 UNIT/ML ~~LOC~~ SOLN
0.0000 [IU] | SUBCUTANEOUS | Status: DC
  Administered 2013-10-10: 8 [IU] via SUBCUTANEOUS
  Administered 2013-10-11: 11 [IU] via SUBCUTANEOUS
  Administered 2013-10-11 (×2): 3 [IU] via SUBCUTANEOUS

## 2013-10-10 MED ORDER — GADOBENATE DIMEGLUMINE 529 MG/ML IV SOLN
10.0000 mL | Freq: Once | INTRAVENOUS | Status: AC
  Administered 2013-10-10: 10 mL via INTRAVENOUS

## 2013-10-10 NOTE — Progress Notes (Signed)
Paged Baltazar Najjar concerning blood glucose. Patient is currently receiving Decadron every 6 hours.  Awaiting new orders, patient is on ACHS blood sugars at this time.  Will continue to monitor closely.

## 2013-10-10 NOTE — Progress Notes (Signed)
Inpatient Diabetes Program Recommendations  AACE/ADA: New Consensus Statement on Inpatient Glycemic Control (2013)  Target Ranges:  Prepandial:   less than 140 mg/dL      Peak postprandial:   less than 180 mg/dL (1-2 hours)      Critically ill patients:  140 - 180 mg/dL   Reason for Assessment:  Results for SIENNA, STONEHOCKER (MRN [id number]) as of 10/10/2013 12:00  Ref. Range 10/09/2013 21:20 10/10/2013 08:11  Glucose-Capillary Latest Range: 70-99 mg/dL 323 (H) 327 (H)   Diabetes history: Type 2 diabetes Outpatient Diabetes medications: Janumet XR 100/1000 mg daily Current orders for Inpatient glycemic control: Metformin 1000 mg daily, Tradgenta 5 mg daily, Novolog sensitive tid with meals and HS  Note that patient is on IV steroids which is likely contributing to elevated CBG's.  Brozowski consider increasing Novolog to moderate q 4 hours while on steroids.  Reichart also need addition of basal insulin such as Levemir 12 units daily if CBG's remain greater than 180 mg/dL.    Thanks, Adah Perl, RN, BC-ADM Inpatient Diabetes Coordinator Pager [phone]

## 2013-10-10 NOTE — Progress Notes (Signed)
Patient ID: [name], female   DOB: Dec 30, 1929, 80 y.o.   MRN: [id number] Stable. For brain mri today

## 2013-10-10 NOTE — Progress Notes (Signed)
[facility] TEAM 1 - Stepdown/ICU TEAM Progress Note  [name] [id number]:[id number] DOB: 1930-02-28 DOA: 10/09/2013 PCP: Georgetta Haber, MD  Admit HPI / Brief Narrative: [name] is an 80 y.o.  PMHx anxiety, irritable bowel syndrome, HTN, CAD, asthma, HLD, Diabetes Type 2, Breast cancer (DCIS) status post right lumpectomy and postlumpectomy radiation therapy, recurrent syncope evaluated by cardiology with plans to have a considerable loop recorder ( ILR) inserted at 12:00 today, but fell around 9:30 a.m. And couldn't get up, was found by family around 1:00 pm on the floor. EMS brought [facility] for [facility] evaluation. The patient reports that she also fell 2 days ago, sustaining head trauma (right sided) and trauma to her back. She has had back pain and headache since the fall 2 days ago.    HPI/Subjective: 6/9 Patient A./O. x4, joking with family and staff. Just returning from MRI  Assessment/Plan: Subdural hemorrhage / right frontal lobe mass  -SDH secondary to trauma, but CT showed an incidentally discovered right frontal lobe mass.  -Decadron 20 mg X 1 followed by 6 mg Q 6 hours.  -MRI w/wo contrast results pending .  -Neurosurgery recommendations pending findings on MRI   -Neuro checks Q 2 hours.  T 12 compression fracture  -Pain control. (Oxycodone IR 5 mg q 4hr PRN pain, Lidoderm patch) - PT/OT when stable.  DM w/o Complication Type II -Continue Epogen to 5 mg daily  -Metformin XR 1000 mg daily  -Increase to moderate SSI . HYPERLIPIDEMIA  -Continue Colestid.  HYPERTENSION  -Continue Tarka. 180 mg/2 mg daily  GERD  -Continue H2 blocker.  Syncope  -Outpatient loop recorder on discharge.  Anxiety  Continue Xanax. 0.25 mg QHS PRN      Code Status: FULL Family Communication: no family present at time of exam Disposition Plan: Per neurosurgery    Consultants: Dr. Leeroy Cha (neurosurgery)    Procedure/Significant Events: 6/8 DG L-spine; New T12 wedge  compression deformity with 50% loss of height anteriorly/some loss height of the posterior margin 6/8 CT head without contrast -Acute, posttraumatic extra-axial hemorrhage along the anterior right convexity, favor subdural hematoma and measuring up to 9 mm in thickness.  -Mass effect and vasogenic edema in anterior right frontal lobe which appears related to large inferior right frontal lobe mass. Favor large extra-axial meningioma, - leftward midline shift of 6 mm.      Culture 6/8 MRSA by PCR negative  Antibiotics: NA  DVT prophylaxis: SCD   Devices N/A   LINES / TUBES:  6/8 20 ga left antecubital    Continuous Infusions: . sodium chloride 50 mL/hr at 10/10/13 0800    Objective: VITAL SIGNS: Temp: 97.8 F (36.6 C) (06/09 1500) Temp src: Oral (06/09 1500) BP: 141/69 mmHg (06/09 0810) Pulse Rate: 73 (06/09 0811) SPO2; FIO2:   Intake/Output Summary (Last 24 hours) at 10/10/13 1701 Last data filed at 10/10/13 1500  Gross per 24 hour  Intake    790 ml  Output   1350 ml  Net   -560 ml     Exam: General: A./O. x4, NAD, No acute respiratory distress, large hematoma right eye Lungs: Clear to auscultation bilaterally without wheezes or crackles Cardiovascular: Regular rate and rhythm without murmur gallop or rub normal S1 and S2 Abdomen: Nontender, nondistended, soft, bowel sounds positive, no rebound, no ascites, no appreciable mass Extremities: No significant cyanosis, clubbing, or edema bilateral lower extremities Neurologic; pupils equal round reactive to light and accommodation, cranial nerves II through  XII intact, tongue/uvula midline, extremity strength 5/5 all extremities, sensation intact throughout, DTR bilateral knee 2+, did not ambulate patient  Data Reviewed: Basic Metabolic Panel:  Recent Labs Lab 10/09/13 1551 10/10/13 0250  NA 136* 132*  K 3.6* 3.9  CL 100 93*  CO2 24 22  GLUCOSE 203* 325*  BUN 8 9  CREATININE 0.69 0.60  CALCIUM 9.1  9.0   Liver Function Tests:  Recent Labs Lab 10/10/13 0250  AST 11  ALT 10  ALKPHOS 72  BILITOT 0.5  PROT 6.6  ALBUMIN 3.5   No results found for this basename: LIPASE, AMYLASE,  in the last 168 hours No results found for this basename: AMMONIA,  in the last 168 hours CBC:  Recent Labs Lab 10/09/13 1551 10/10/13 0250  WBC 7.3 8.2  NEUTROABS 5.7  --   HGB 11.6* 12.1  HCT 33.8* 35.1*  MCV 86.9 86.9  PLT 231 219   Cardiac Enzymes: No results found for this basename: CKTOTAL, CKMB, CKMBINDEX, TROPONINI,  in the last 168 hours BNP (last 3 results) No results found for this basename: PROBNP,  in the last 8760 hours CBG:  Recent Labs Lab 10/09/13 2120 10/10/13 0811  GLUCAP 323* 327*    Recent Results (from the past 240 hour(s))  MRSA PCR SCREENING     Status: None   Collection Time    10/09/13 11:16 PM      Result Value Ref Range Status   MRSA by PCR NEGATIVE  NEGATIVE Final   Comment:            The GeneXpert MRSA Assay (FDA     approved for NASAL specimens     only), is one component of a     comprehensive MRSA colonization     surveillance program. It is not     intended to diagnose MRSA     infection nor to guide or     monitor treatment for     MRSA infections.     Studies:  Recent x-ray studies have been reviewed in detail by the Attending Physician  Scheduled Meds:  Scheduled Meds: . calcium carbonate  1 tablet Oral Q breakfast  . cholecalciferol  1,000 Units Oral Daily  . colestipol  2 g Oral BID  . dexamethasone  6 mg Intravenous 4 times per day  . famotidine  20 mg Oral Daily  . ferrous sulfate  325 mg Oral Q breakfast  . insulin aspart  0-5 Units Subcutaneous QHS  . insulin aspart  0-9 Units Subcutaneous TID WC  . lidocaine  1 patch Transdermal Q24H  . linagliptin  5 mg Oral Daily  . loratadine  10 mg Oral Daily  . metFORMIN  1,000 mg Oral Q breakfast  . omega-3 acid ethyl esters  1 g Oral Daily  . trandolapril  2 mg Oral Daily  .  verapamil  180 mg Oral Daily    Time spent on care of this patient: 40 mins   Allie Bossier , MD   [facility] Office  [phone] Pager [phone]  On-Call/Text Page:      [email or web address]      password [email or web address]  If 7PM-7AM, please contact night-coverage [email or web address] Password [email or web address] 10/10/2013, 5:01 PM   LOS: 1 day

## 2013-10-10 NOTE — Progress Notes (Signed)
Patient ID: [name], female   DOB: [birth year], 80 y.o.   MRN: [id number] Neuro stable. Mri brain pending

## 2013-10-10 NOTE — Progress Notes (Signed)
Utilization review completed.  

## 2013-10-10 NOTE — Significant Event (Signed)
Received patient from [place] transfer from [facility].  Patient Alert and oriented, family at bedside.  Patient oriented to environment, call bell placed within reach, bed alarm on, bed rails up X2.  BP 146/82  Pulse 73  Temp(Src) 97.4 F (36.3 C) (Axillary)  Resp 17  Ht 5' 7.5" (1.715 m)  Wt 61.6 kg (135 lb 12.9 oz)  BMI 20.94 kg/m2  SpO2 95%.  Will continue to monitor patient closely, admitting team and Neurosurgery notified of patients arrival.

## 2013-10-11 LAB — GLUCOSE, CAPILLARY
Glucose-Capillary: 168 mg/dL — ABNORMAL HIGH (ref 70–99)
Glucose-Capillary: 272 mg/dL — ABNORMAL HIGH (ref 70–99)
Glucose-Capillary: 282 mg/dL — ABNORMAL HIGH (ref 70–99)
Glucose-Capillary: 309 mg/dL — ABNORMAL HIGH (ref 70–99)
Glucose-Capillary: 319 mg/dL — ABNORMAL HIGH (ref 70–99)
Glucose-Capillary: 177 mg/dL — ABNORMAL HIGH (ref 70–99)
Glucose-Capillary: 96 mg/dL (ref 70–99)
Glucose-Capillary: 343 mg/dL — ABNORMAL HIGH (ref 70–99)
Glucose-Capillary: 276 mg/dL — ABNORMAL HIGH (ref 70–99)

## 2013-10-11 MED ORDER — FAMOTIDINE 20 MG PO TABS
20.0000 mg | ORAL_TABLET | Freq: Two times a day (BID) | ORAL | Status: DC
  Administered 2013-10-11 – 2013-10-27 (×31): 20 mg via ORAL
  Filled 2013-10-11 (×39): qty 1

## 2013-10-11 MED ORDER — INSULIN ASPART 100 UNIT/ML ~~LOC~~ SOLN
0.0000 [IU] | Freq: Three times a day (TID) | SUBCUTANEOUS | Status: DC
  Administered 2013-10-11: 11 [IU] via SUBCUTANEOUS
  Administered 2013-10-12: 8 [IU] via SUBCUTANEOUS

## 2013-10-11 NOTE — Progress Notes (Signed)
Paged Dr. Cyndy Freeze at [phone] regarding Q2 neuro check order upon transfer. Awaiting call back.  New orders placed.

## 2013-10-11 NOTE — Progress Notes (Signed)
Patient is transferred from unit 3S to room 4N27 at this time. Alert and in stable condition.

## 2013-10-11 NOTE — Progress Notes (Signed)
Paged Dr. Thereasa Solo at [phone] regarding possible d/c of neuro checks Q2 upon transfer.  Awaiting call back.  Did not hear back by 1708. Paged Dr. Cyndy Freeze.

## 2013-10-11 NOTE — Progress Notes (Signed)
Pt transferred to 4N27 at 1740.

## 2013-10-11 NOTE — Progress Notes (Signed)
Patient ID: [name], female   DOB: [birth year], 80 y.o.   MRN: [id number] Neuro stable. Mri shows a meningioma.i will be out of town till next Monday. Dr Christella Noa to f/u

## 2013-10-11 NOTE — Progress Notes (Signed)
[facility] TEAM 1 - Stepdown/ICU TEAM Progress Note  [name] PNT:[id number] DOB: 02-13-1930 DOA: 10/09/2013 PCP: Georgetta Haber, MD  Admit HPI / Brief Narrative: 80 y.o. F w/ Hx anxiety, irritable bowel syndrome, HTN, CAD, asthma, HLD, Diabetes Type 2, Breast cancer (DCIS) status post right lumpectomy and postlumpectomy radiation therapy, and recurrent syncope evaluated by Cardiology with plans for a loop recorder inserted at 12:00 the day of her admit, who fell around 9:30 a.m. and couldn't get up.  She was found by family around 1:00 pm on the floor. EMS brought her to [facility] for further evaluation. The patient reported that she also fell 2 days prior, sustaining head trauma (right sided) and trauma to her back. She has had back pain and headache since the first fall.   HPI/Subjective: The pt is in good spirits. She c/o only a mild HA.  No N/V, cp, sob, or abdom pain.  She is anxious to go home, but her family is concerned that she is very unstable on her feet and unfit to live alone at present.    Assessment/Plan:  Subdural hemorrhage -SDH secondary to trauma, but CT showed an incidentally discovered right frontal lobe mass (see below) -Decadron 20 mg X 1 followed by 6 mg Q 6 hours -Neurosurgery following  -will plan to recheck CT of head in 24hrs to assure SDH remains stable in size   Right frontal lobe mass  -MRI confirms mass is c/w a meningioma, and suggests is causing 30mm shift to L  -per pt, Dr. Joya Salm is planning resection of meningioma Monday of next week   T 12 compression fracture  -Pain control -PT/OT to begin   DM w/o Complication Type II -CBG reasonably well controlled at present - no change in tx plan today  HYPERLIPIDEMIA  -Continue Colestid  HYPERTENSION  -BP currently well controlled - follow trend   GERD  -Continue H2 blocker  Syncope  -Outpatient loop recorder to be reconsidered post-op in outpt setting  Anxiety  -Continue Xanax. 0.25 mg QHS  PRN  Code Status: FULL Family Communication: spoke w/ pt and multiple other family members at the bedside  Disposition Plan: transfer to Neuro floor - PT/OT to determine if pt stable for independent living - Naves have to consider staying in hospital until surgery can be completed  Consultants: Dr. Leeroy Cha (neurosurgery)  Procedure/Significant Events:  6/8 DG L-spine; New T12 wedge compression deformity with 50% loss of height anteriorly/some loss height of the posterior margin  6/8 CT head without contrast -Acute, posttraumatic extra-axial hemorrhage along the anterior right convexity, favor subdural hematoma and measuring up to 9 mm in thickness.  -Mass effect and vasogenic edema in anterior right frontal lobe which appears related to large inferior right frontal lobe mass. Favor large extra-axial meningioma, - leftward midline shift of 6 mm.   6/9 MRI Brain Acute right hemispheric subdural hematoma.  Meningioma in the right frontal lobe along the roof of the right  orbit. This extends across the cribriform plate with mild extension  to the left of midline. There is vasogenic edema and a peritumoral  cyst above the mass. 7 mm midline shift to the left.   Culture 6/8 MRSA by PCR negative  Antibiotics: NA  DVT prophylaxis: SCD   LINES / TUBES:  6/8 20 ga left antecubital  Objective: VITAL SIGNS: Blood pressure 135/61, pulse 72, temperature 98.1 F (36.7 C), temperature source Oral, resp. rate 14, height 5' 7.5" (1.715 m), weight 61.6 kg (  135 lb 12.9 oz), SpO2 98.00%.  Intake/Output Summary (Last 24 hours) at 10/11/13 1432 Last data filed at 10/11/13 1200  Gross per 24 hour  Intake   1420 ml  Output    750 ml  Net    670 ml   Exam: General: No acute respiratory distress, large bruise about R eye mostly yellow in discoloration now Lungs: Clear to auscultation bilaterally without wheezes or crackles Cardiovascular: Regular rate and rhythm without murmur gallop  or rub normal S1 and S2 Abdomen: Nontender, nondistended, soft, bowel sounds positive, no rebound, no ascites, no appreciable mass Extremities: No significant cyanosis, clubbing, or edema bilateral lower extremities Neurologic; cranial nerves II through XII intact, tongue/uvula midline, extremity strength 5/5 all extremities, sensation intact throughout  Data Reviewed: Basic Metabolic Panel:  Recent Labs Lab 10/09/13 1551 10/10/13 0250  NA 136* 132*  K 3.6* 3.9  CL 100 93*  CO2 24 22  GLUCOSE 203* 325*  BUN 8 9  CREATININE 0.69 0.60  CALCIUM 9.1 9.0   Liver Function Tests:  Recent Labs Lab 10/10/13 0250  AST 11  ALT 10  ALKPHOS 72  BILITOT 0.5  PROT 6.6  ALBUMIN 3.5   CBC:  Recent Labs Lab 10/09/13 1551 10/10/13 0250  WBC 7.3 8.2  NEUTROABS 5.7  --   HGB 11.6* 12.1  HCT 33.8* 35.1*  MCV 86.9 86.9  PLT 231 219   CBG:  Recent Labs Lab 10/10/13 2100 10/10/13 2343 10/11/13 0408 10/11/13 0758 10/11/13 1150  GLUCAP 272* 309* 168* 177* 96    Recent Results (from the past 240 hour(s))  MRSA PCR SCREENING     Status: None   Collection Time    10/09/13 11:16 PM      Result Value Ref Range Status   MRSA by PCR NEGATIVE  NEGATIVE Final   Comment:            The GeneXpert MRSA Assay (FDA     approved for NASAL specimens     only), is one component of a     comprehensive MRSA colonization     surveillance program. It is not     intended to diagnose MRSA     infection nor to guide or     monitor treatment for     MRSA infections.    Studies:  Recent x-ray studies have been reviewed in detail by the Attending Physician  Scheduled Meds:  Scheduled Meds: . calcium carbonate  1 tablet Oral Q breakfast  . cholecalciferol  1,000 Units Oral Daily  . colestipol  2 g Oral BID  . dexamethasone  6 mg Intravenous 4 times per day  . famotidine  20 mg Oral Daily  . ferrous sulfate  325 mg Oral Q breakfast  . insulin aspart  0-15 Units Subcutaneous Q4H  .  lidocaine  1 patch Transdermal Q24H  . linagliptin  5 mg Oral Daily  . loratadine  10 mg Oral Daily  . metFORMIN  1,000 mg Oral Q breakfast  . omega-3 acid ethyl esters  1 g Oral Daily  . trandolapril  2 mg Oral Daily  . verapamil  180 mg Oral Daily   Time spent on care of this patient: 35 mins  Cherene Altes, MD [facility] For Consults/Admissions - Flow Manager - [phone] Office  [phone] Pager [phone]  On-Call/Text Page:      [email or web address]      password [facility]  10/11/2013, 2:32 PM   LOS: 2 days

## 2013-10-11 NOTE — Progress Notes (Signed)
Report called to [facility] on 4N.

## 2013-10-12 LAB — GLUCOSE, CAPILLARY
Glucose-Capillary: 295 mg/dL — ABNORMAL HIGH (ref 70–99)
Glucose-Capillary: 375 mg/dL — ABNORMAL HIGH (ref 70–99)
Glucose-Capillary: 299 mg/dL — ABNORMAL HIGH (ref 70–99)

## 2013-10-12 LAB — BASIC METABOLIC PANEL
BUN: 19 mg/dL (ref 6–23)
CO2: 21 mEq/L (ref 19–32)
Calcium: 8.4 mg/dL (ref 8.4–10.5)
Chloride: 95 mEq/L — ABNORMAL LOW (ref 96–112)
Creatinine, Ser: 0.78 mg/dL (ref 0.50–1.10)
GFR calc Af Amer: 88 mL/min — ABNORMAL LOW (ref >90)
GFR calc non Af Amer: 76 mL/min — ABNORMAL LOW (ref >90)
Glucose, Bld: 289 mg/dL — ABNORMAL HIGH (ref 70–99)
Potassium: 4.2 mEq/L (ref 3.7–5.3)
Sodium: 131 mEq/L — ABNORMAL LOW (ref 137–147)

## 2013-10-12 LAB — CBC
HCT: 30.4 % — ABNORMAL LOW (ref 36.0–46.0)
Hemoglobin: 10.3 g/dL — ABNORMAL LOW (ref 12.0–15.0)
MCH: 29.7 pg (ref 26.0–34.0)
MCHC: 33.9 g/dL (ref 30.0–36.0)
MCV: 87.6 fL (ref 78.0–100.0)
Platelets: 252 10*3/uL (ref 150–400)
RBC: 3.47 MIL/uL — ABNORMAL LOW (ref 3.87–5.11)
RDW: 13.9 % (ref 11.5–15.5)
WBC: 10.1 10*3/uL (ref 4.0–10.5)

## 2013-10-12 LAB — PROTIME-INR
INR: 1.08 (ref 0.00–1.49)
Prothrombin Time: 13.8 seconds (ref 11.6–15.2)

## 2013-10-12 LAB — HEMOGLOBIN A1C
Hgb A1c MFr Bld: 7.7 % — ABNORMAL HIGH (ref <5.7)
Mean Plasma Glucose: 174 mg/dL — ABNORMAL HIGH (ref <117)

## 2013-10-12 LAB — APTT: aPTT: 25 seconds (ref 24–37)

## 2013-10-12 LAB — SODIUM, URINE, RANDOM: Sodium, Ur: 35 mEq/L

## 2013-10-12 LAB — OSMOLALITY, URINE: Osmolality, Ur: 376 mOsm/kg — ABNORMAL LOW (ref 390–1090)

## 2013-10-12 MED ORDER — DEXAMETHASONE 4 MG PO TABS: 4.0000 mg | ORAL_TABLET | Freq: Four times a day (QID) | ORAL | Status: DC

## 2013-10-12 MED ORDER — INSULIN ASPART 100 UNIT/ML ~~LOC~~ SOLN
0.0000 [IU] | Freq: Three times a day (TID) | SUBCUTANEOUS | Status: DC
  Administered 2013-10-12: 5 [IU] via SUBCUTANEOUS
  Administered 2013-10-12: 9 [IU] via SUBCUTANEOUS
  Administered 2013-10-13 (×2): 5 [IU] via SUBCUTANEOUS
  Administered 2013-10-13 – 2013-10-14 (×2): 3 [IU] via SUBCUTANEOUS
  Administered 2013-10-14: 7 [IU] via SUBCUTANEOUS
  Administered 2013-10-14: 2 [IU] via SUBCUTANEOUS
  Administered 2013-10-15: 5 [IU] via SUBCUTANEOUS
  Administered 2013-10-15: 2 [IU] via SUBCUTANEOUS
  Administered 2013-10-15: 9 [IU] via SUBCUTANEOUS
  Administered 2013-10-16 (×2): 3 [IU] via SUBCUTANEOUS

## 2013-10-12 MED ORDER — INSULIN GLARGINE 100 UNIT/ML ~~LOC~~ SOLN
10.0000 [IU] | Freq: Every day | SUBCUTANEOUS | Status: DC
  Administered 2013-10-12: 10 [IU] via SUBCUTANEOUS
  Filled 2013-10-12 (×2): qty 0.1

## 2013-10-12 MED ORDER — DEXAMETHASONE 4 MG PO TABS
4.0000 mg | ORAL_TABLET | Freq: Two times a day (BID) | ORAL | Status: DC
  Administered 2013-10-12 – 2013-10-19 (×14): 4 mg via ORAL
  Filled 2013-10-12 (×17): qty 1

## 2013-10-12 NOTE — Evaluation (Signed)
Physical Therapy Evaluation Patient Details Name: [name] MRN: [id number] DOB: Perfecto 27, 1931 Today's Date: 10/12/2013   History of Present Illness  Admitted due to multiple falls due to syncope with subsequent SDH and facial trauma.  Clinical Impression  Pt admitted with/for syncope and falls.  Pt currently limited functionally due to the problems listed below.  (see problems list.)  Pt will benefit from PT to maximize function and safety to be able to get home safely with limited available assist of family or friends.     Follow Up Recommendations Home health PT    Equipment Recommendations  Other (comment) (TBA)    Recommendations for Other Services       Precautions / Restrictions Precautions Precautions: Fall Restrictions Weight Bearing Restrictions: No      Mobility  Bed Mobility Overal bed mobility: Modified Independent                Transfers Overall transfer level: Needs assistance   Transfers: Sit to/from Stand Sit to Stand: Supervision         General transfer comment: supervision for safety  Ambulation/Gait Ambulation/Gait assistance: Min guard Ambulation Distance (Feet): 600 Feet Assistive device: None Gait Pattern/deviations: Step-through pattern (some wandering and scissoring)   Gait velocity interpretation: Below normal speed for age/gender General Gait Details: mildly unsteady with mild wandering and scissoring to maintain steadiness.  No overt LOB and more fluid as distance progresses  Financial trader Rankin (Stroke Patients Only)       Balance Overall balance assessment: Needs assistance Sitting-balance support: No upper extremity supported Sitting balance-Leahy Scale: Fair     Standing balance support: No upper extremity supported Standing balance-Leahy Scale: Fair                               Pertinent Vitals/Pain     Home Living Family/patient expects to be  discharged to:: Private residence Living Arrangements: Alone Available Help at Discharge: Available PRN/intermittently;Family Type of Home: House Home Access: Stairs to enter Entrance Stairs-Rails: Psychiatric nurse of Steps: 4 Home Layout: One level Home Equipment: Environmental consultant - 2 wheels      Prior Function Level of Independence: Independent               Hand Dominance        Extremity/Trunk Assessment               Lower Extremity Assessment: Overall WFL for tasks assessed;Generalized weakness         Communication   Communication: No difficulties  Cognition Arousal/Alertness: Awake/alert Behavior During Therapy: WFL for tasks assessed/performed Overall Cognitive Status: Within Functional Limits for tasks assessed                      General Comments General comments (skin integrity, edema, etc.): Part of the session was show pt several ways to get up out of the floor, but pt didn't want to practice today.    Exercises        Assessment/Plan    PT Assessment Patient needs continued PT services  PT Diagnosis Generalized weakness   PT Problem List Decreased strength;Decreased activity tolerance;Decreased mobility  PT Treatment Interventions Gait training;Functional mobility training;Therapeutic activities;Patient/family education   PT Goals (Current goals can be found in the Care Plan section) Acute Rehab PT Goals Patient Stated  Goal: Figure out why I keep passing out PT Goal Formulation: With patient Time For Goal Achievement: 10/24/13 Potential to Achieve Goals: Good    Frequency Min 3X/week   Barriers to discharge Decreased caregiver support pt has life alert and ADT, but still feels the need to bar her doors with sticks from the inside so no one can get in the house.    Co-evaluation               End of Session   Activity Tolerance: Patient tolerated treatment well Patient left: in chair;with call bell/phone  within reach Nurse Communication: Mobility status         Time: 1215-1234 PT Time Calculation (min): 19 min   Charges:   PT Evaluation $Initial PT Evaluation Tier I: 1 Procedure PT Treatments $Gait Training: 8-22 mins   PT G Codes:          Zohan Shiflet, Tessie Fass 10/12/2013, 1:35 PM 10/12/2013  Donnella Sham, PT [phone]  (pager)

## 2013-10-12 NOTE — Progress Notes (Signed)
Patient ID: [name], female   DOB: 1929-08-30, 80 y.o.   MRN: [id number] BP 159/64  Pulse 88  Temp(Src) 97.4 F (36.3 C) (Oral)  Resp 18  Ht 5' 7.5" (1.715 m)  Wt 61.6 kg (135 lb 12.9 oz)  BMI 20.94 kg/m2  SpO2 99% Alert and oriented x 4, speech is cleaR AND fluent Following all commands Moving all extremities well Will decrease decadron. Doing well plan for or next week.

## 2013-10-12 NOTE — Clinical Documentation Improvement (Signed)
Neurology MD's, NP's, and PA's  Per MRI "There is vasogenic edema and a peritumoral cyst above the mass. 7 mm midline shift to the left.  "  Medicare rules require specification as to whether an inpatient diagnosis was present at the time of admission.    Please clarify if the following diagnosis Vasogenic Edema was:       Present at the time of admission   NOT present at the time of inpatient admission and it developed during the inpatient stay   Unable to clinically determine whether the condition was present on admission.   Documentation insufficient to determine if condition was present at the time of inpatient admission  Thank You, Ree Kida ,RN Clinical Documentation Specialist:  [facility]

## 2013-10-12 NOTE — Progress Notes (Signed)
[facility] PROGRESS NOTE  [name] JAS:[id number] DOB: 1930-03-08 DOA: 10/09/2013 PCP: Georgetta Haber, MD  Brief summary  80 y.o. F w/ Hx anxiety, irritable bowel syndrome, HTN, CAD, asthma, HLD, Diabetes Type 2, Breast cancer (DCIS) status post right lumpectomy and postlumpectomy radiation therapy, and recurrent syncope, fall evaluated by Cardiology with plans for a loop recorder inserted at 12:00 the day of her admit, who fell around 9:30 a.m. and couldn't get up. She was found by family around 1:00 pm on the floor. EMS brought her to [facility] for further evaluation. The patient reported that she also fell 2 days prior, sustaining head trauma (right sided) and trauma to her back. She has had back pain and headache since the first fall.   Assessment/Plan:  1. Subdural hemorrhage  -SDH secondary to trauma, but CT showed an incidentally discovered right frontal lobe mass (see below)  -Decadron 20 mg X 1 followed by 6 mg Q 6 hours  -Neurosurgery following; plan to recheck CT of head AM to assure SDH remains stable in size  2. Right frontal lobe mass  -MRI confirms mass is c/w a meningioma, and suggests is causing 48mm shift to L  -per pt, Dr. Joya Salm is planning resection of meningioma Monday of next week  3. T 12 compression fracture  -Pain control; PT/OT to begin   4. DM w/o Complication Type II; AL9F-7.9;  hopld PO meds; monitor on ISS  5. HPL; Continue Colestid  6. HTN; BP currently well controlled - follow trend  6. GERD; Continue H2 blocker  7. Syncope recurrent fall; monitor on tele'; echo (09/2013): LVEF normal; carotid doppler unremarkable (08/2011) -Outpatient loop recorder to be reconsidered post-op in outpt setting  -PT ot eval  8. Anxiety; Continue Xanax. 0.25 mg QHS PRN -? Steroid side effect with worsening anxiety; monitor  9. Hypo Na, ? Cerebral salt wasting; obtain, urine Na, osm; Iv NS;    Code Status: full Family Communication: d/w patient, her brother  (indicate  person spoken with, relationship, and if by phone, the number) Disposition Plan: pend clinical improvement    Consultants:  Neurosurgery  Consultants:  Dr. Leeroy Cha (neurosurgery)  Procedure/Significant Events:  6/8 DG L-spine; New T12 wedge compression deformity with 50% loss of height anteriorly/some loss height of the posterior margin  6/8 CT head without contrast  -Acute, posttraumatic extra-axial hemorrhage along the anterior right convexity, favor subdural hematoma and measuring up to 9 mm in thickness.  -Mass effect and vasogenic edema in anterior right frontal lobe which appears related to large inferior right frontal lobe mass. Favor large extra-axial meningioma,  - leftward midline shift of 6 mm.  6/9 MRI Brain  Acute right hemispheric subdural hematoma.  Meningioma in the right frontal lobe along the roof of the right  orbit. This extends across the cribriform plate with mild extension  to the left of midline. There is vasogenic edema and a peritumoral  cyst above the mass. 7 mm midline shift to the left.  Culture  6/8 MRSA by PCR negative  Antibiotics:  NA   HPI/Subjective: alert  Objective: Filed Vitals:   10/12/13 1045  BP: 133/65  Pulse: 66  Temp: 97.3 F (36.3 C)  Resp: 16    Intake/Output Summary (Last 24 hours) at 10/12/13 1129 Last data filed at 10/12/13 0900  Gross per 24 hour  Intake    290 ml  Output      0 ml  Net    290 ml  Filed Weights   10/09/13 2206  Weight: 61.6 kg (135 lb 12.9 oz)    Exam:   General:  alert  Cardiovascular: s1,s2 rrr  Respiratory: CTA BL  Abdomen: soft, nt, nd   Musculoskeletal: no LE edema  Data Reviewed: Basic Metabolic Panel:  Recent Labs Lab 10/09/13 1551 October 28, 2013 0250 10/12/13 0449  NA 136* 132* 131*  K 3.6* 3.9 4.2  CL 100 93* 95*  CO2 24 22 21   GLUCOSE 203* 325* 289*  BUN 8 9 19   CREATININE 0.69 0.60 0.78  CALCIUM 9.1 9.0 8.4   Liver Function Tests:  Recent Labs Lab  October 28, 2013 0250  AST 11  ALT 10  ALKPHOS 72  BILITOT 0.5  PROT 6.6  ALBUMIN 3.5   No results found for this basename: LIPASE, AMYLASE,  in the last 168 hours No results found for this basename: AMMONIA,  in the last 168 hours CBC:  Recent Labs Lab 10/09/13 1551 10/28/2013 0250 10/12/13 0449  WBC 7.3 8.2 10.1  NEUTROABS 5.7  --   --   HGB 11.6* 12.1 10.3*  HCT 33.8* 35.1* 30.4*  MCV 86.9 86.9 87.6  PLT 231 219 252   Cardiac Enzymes: No results found for this basename: CKTOTAL, CKMB, CKMBINDEX, TROPONINI,  in the last 168 hours BNP (last 3 results) No results found for this basename: PROBNP,  in the last 8760 hours CBG:  Recent Labs Lab 10/11/13 0758 10/11/13 1150 10/11/13 1544 10/11/13 2224 10/12/13 0653  GLUCAP 177* 96 343* 276* 295*    Recent Results (from the past 240 hour(s))  MRSA PCR SCREENING     Status: None   Collection Time    10/09/13 11:16 PM      Result Value Ref Range Status   MRSA by PCR NEGATIVE  NEGATIVE Final   Comment:            The GeneXpert MRSA Assay (FDA     approved for NASAL specimens     only), is one component of a     comprehensive MRSA colonization     surveillance program. It is not     intended to diagnose MRSA     infection nor to guide or     monitor treatment for     MRSA infections.     Studies: Mr Kizzie Fantasia Contrast  October 28, 2013   CLINICAL DATA:  Fall with head injury. Subdural hematoma. Intracranial mass identified on CT  EXAM: MRI HEAD WITHOUT AND WITH CONTRAST  TECHNIQUE: Multiplanar, multiecho pulse sequences of the brain and surrounding structures were obtained without and with intravenous contrast.  CONTRAST:  5mL MULTIHANCE GADOBENATE DIMEGLUMINE 529 MG/ML IV SOLN  COMPARISON:  CT head 10/09/2013  FINDINGS: Acute subdural hemorrhage on the right again noted and similar to the CT. This extends from the right frontal lobe posteriorly into the occipital lobe. The subdural hematoma measures up to 8 mm. There is also a  small amount of right tentorial subdural hematoma.  Solid enhancing extra-axial mass is present above the right orbit. This extends across the cribriform plate and crosses the midline by several mm. The mass shows homogeneous enhancement and measures approximately 3.9 x 4.7 x 2.9 cm. There is mass-effect on the brain with 7 mm of midline shift. 10 x 20 mm cyst is present just above the enhancing portion of the mass. There is vasogenic edema in the right frontal lobe. No other enhancing mass lesion.  Negative for acute ischemic infarction.  IMPRESSION: Acute  right hemispheric subdural hematoma.  Meningioma in the right frontal lobe along the roof of the right orbit. This extends across the cribriform plate with mild extension to the left of midline. There is vasogenic edema and a peritumoral cyst above the mass. 7 mm midline shift to the left.   Electronically Signed   By: Franchot Gallo M.D.   On: 10/10/2013 17:15    Scheduled Meds: . calcium carbonate  1 tablet Oral Q breakfast  . cholecalciferol  1,000 Units Oral Daily  . colestipol  2 g Oral BID  . dexamethasone  6 mg Intravenous 4 times per day  . famotidine  20 mg Oral BID  . ferrous sulfate  325 mg Oral Q breakfast  . insulin aspart  0-15 Units Subcutaneous TID WC  . lidocaine  1 patch Transdermal Q24H  . linagliptin  5 mg Oral Daily  . loratadine  10 mg Oral Daily  . metFORMIN  1,000 mg Oral Q breakfast  . omega-3 acid ethyl esters  1 g Oral Daily  . trandolapril  2 mg Oral Daily  . verapamil  180 mg Oral Daily   Continuous Infusions: . sodium chloride 10 mL/hr at 10/11/13 1552    Principal Problem:   Subdural hemorrhage Active Problems:   DM w/o Complication Type II   HYPERLIPIDEMIA   HYPERTENSION   GERD   Syncope   Carotid artery disease   Right frontal lobe mass   Anxiety   Compression fracture of T12 vertebra    Time spent: >35 minutes     Kinnie Feil  [facility] Pager [phone]. If 7PM-7AM, please  contact night-coverage at [email or web address], password [facility] 10/12/2013, 11:29 AM  LOS: 3 days

## 2013-10-12 NOTE — Progress Notes (Signed)
Occupational Therapy Evaluation and Discharge Patient Details Name: [name] MRN: [id number] DOB: 1929-08-31 Today's Date: 10/12/2013    History of Present Illness Pt is 80 y.o. Female admitted 10/09/13 for syncope and multiple falls with subsequent SDH and facial trauma.    Clinical Impression   PTA pt lived at home alone and was independent with ADLs and functional mobility, however recent history of falls due to syncope. Pt verbalized technique of standing and pausing to wait for "head to clear" before walking. Pt overall at Supervision level for ADLs. Pt would benefit from [facility] for environmental modification and independence with ADLs. No further acute OT needs.     Follow Up Recommendations  Home health OT;Supervision/Assistance - 24 hour    Equipment Recommendations  None recommended by OT       Precautions / Restrictions Precautions Precautions: Fall Restrictions Weight Bearing Restrictions: No      Mobility Bed Mobility Overal bed mobility: Modified Independent                Transfers Overall transfer level: Needs assistance Equipment used: None Transfers: Sit to/from Stand Sit to Stand: Supervision         General transfer comment: supervision for safety    Balance Overall balance assessment: Needs assistance Sitting-balance support: No upper extremity supported Sitting balance-Leahy Scale: Fair     Standing balance support: No upper extremity supported Standing balance-Leahy Scale: Fair                              ADL Overall ADL's : Needs assistance/impaired Eating/Feeding: Independent;Sitting   Grooming: Supervision/safety;Standing   Upper Body Bathing: Sitting;Set up   Lower Body Bathing: Supervison/ safety;Sit to/from stand;Set up   Upper Body Dressing : Set up;Sitting   Lower Body Dressing: Supervision/safety;Set up;Sit to/from stand   Toilet Transfer: Ambulation;Min guard;BSC   Toileting- Water quality scientist and  Hygiene: Supervision/safety;Sit to/from stand   Tub/ Shower Transfer: Walk-in shower;Min guard;Ambulation;Shower seat   Functional mobility during ADLs: Min guard General ADL Comments: Pt is at Supervision level for ADLs. Pt was able to don/doff socks and ambulated to bathroom with min Guard.      Vision  Pt reports no change from baseline.                   Perception Perception Perception Tested?: No   Praxis Praxis Praxis tested?: Within functional limits    Pertinent Vitals/Pain NAD     Hand Dominance Right   Extremity/Trunk Assessment Upper Extremity Assessment Upper Extremity Assessment: Overall WFL for tasks assessed   Lower Extremity Assessment Lower Extremity Assessment: Overall WFL for tasks assessed   Cervical / Trunk Assessment Cervical / Trunk Assessment: Normal   Communication Communication Communication: No difficulties   Cognition Arousal/Alertness: Awake/alert Behavior During Therapy: WFL for tasks assessed/performed Overall Cognitive Status: Within Functional Limits for tasks assessed                        Exercises Exercises: Other exercises Other Exercises Other Exercises: Pt c/o UEs feeling weak at times and demonstrated exercises to increase strength (shoulder flexion/abduction, elbow flex/ext) and encouraged pt to perform daily.         Home Living Family/patient expects to be discharged to:: Private residence Living Arrangements: Alone Available Help at Discharge: Available PRN/intermittently;Family Type of Home: House Home Access: Stairs to enter [employer] of Steps: 4 Entrance Stairs-Rails: Right;Left  Home Layout: One level     Bathroom Shower/Tub: Walk-in shower         Home Equipment: Environmental consultant - 2 wheels;Shower seat - built in          Prior Functioning/Environment Level of Independence: Independent                                       End of Session  Activity Tolerance:  Patient tolerated treatment well Patient left: in bed;with call bell/phone within reach;with bed alarm set   Time: 1413-1446 OT Time Calculation (min): 33 min Charges:  OT General Charges $OT Visit: 1 Procedure OT Evaluation $Initial OT Evaluation Tier I: 1 Procedure OT Treatments $Self Care/Home Management : 23-37 mins  [name], [name] [phone] 10/12/2013, 2:59 PM

## 2013-10-12 NOTE — Progress Notes (Signed)
Inpatient Diabetes Program Recommendations  AACE/ADA: New Consensus Statement on Inpatient Glycemic Control (2013)  Target Ranges:  Prepandial:   less than 140 mg/dL      Peak postprandial:   less than 180 mg/dL (1-2 hours)      Critically ill patients:  140 - 180 mg/dL   Reason for Assessment:Results for LEIGH, BLAS (MRN [id number]) as of 10/12/2013 14:25  Ref. Range 10/11/2013 15:44 10/11/2013 22:24 10/12/2013 06:53 10/12/2013 12:18  Glucose-Capillary Latest Range: 70-99 mg/dL 343 (H) 276 (H) 295 (H) 375 (H)    Results for BAILEE, METTER (MRN [id number]) as of 10/12/2013 14:25  Ref. Range 10/12/2013 04:49  Hemoglobin A1C Latest Range: <5.7 % 7.7 (H)   Diabetes history: Type 2 diabetes Outpatient Diabetes medications: Janumet XR [phone] mg daily Current orders for Inpatient glycemic control:  Novolog sensitive tid with meals.  Consider adding basal insulin such as Levemir 12 units daily.  Sent text page to Dr. Daleen Bo.  Thanks, Adah Perl, RN, BC-ADM Inpatient Diabetes Coordinator Pager [phone]

## 2013-10-13 ENCOUNTER — Encounter (HOSPITAL_COMMUNITY): Payer: Self-pay

## 2013-10-13 ENCOUNTER — Inpatient Hospital Stay (HOSPITAL_COMMUNITY): Payer: Medicare Other

## 2013-10-13 LAB — BASIC METABOLIC PANEL
BUN: 19 mg/dL (ref 6–23)
CO2: 22 mEq/L (ref 19–32)
Calcium: 8.2 mg/dL — ABNORMAL LOW (ref 8.4–10.5)
Chloride: 100 mEq/L (ref 96–112)
Creatinine, Ser: 0.68 mg/dL (ref 0.50–1.10)
GFR calc Af Amer: >90 mL/min (ref >90)
GFR calc non Af Amer: 80 mL/min — ABNORMAL LOW (ref >90)
Glucose, Bld: 304 mg/dL — ABNORMAL HIGH (ref 70–99)
Potassium: 4.4 mEq/L (ref 3.7–5.3)
Sodium: 135 mEq/L — ABNORMAL LOW (ref 137–147)

## 2013-10-13 LAB — GLUCOSE, CAPILLARY
Glucose-Capillary: 276 mg/dL — ABNORMAL HIGH (ref 70–99)
Glucose-Capillary: 261 mg/dL — ABNORMAL HIGH (ref 70–99)
Glucose-Capillary: 224 mg/dL — ABNORMAL HIGH (ref 70–99)
Glucose-Capillary: 318 mg/dL — ABNORMAL HIGH (ref 70–99)

## 2013-10-13 MED ORDER — POLYETHYLENE GLYCOL 3350 17 G PO PACK
17.0000 g | PACK | Freq: Every day | ORAL | Status: DC
  Administered 2013-10-13 – 2013-10-27 (×11): 17 g via ORAL
  Filled 2013-10-13 (×16): qty 1

## 2013-10-13 MED ORDER — INSULIN GLARGINE 100 UNIT/ML ~~LOC~~ SOLN
18.0000 [IU] | Freq: Every day | SUBCUTANEOUS | Status: DC
  Administered 2013-10-13: 18 [IU] via SUBCUTANEOUS
  Filled 2013-10-13 (×2): qty 0.18

## 2013-10-13 MED ORDER — OXYCODONE HCL 5 MG PO TABS
10.0000 mg | ORAL_TABLET | ORAL | Status: DC | PRN
  Administered 2013-10-13 – 2013-10-26 (×20): 10 mg via ORAL
  Filled 2013-10-13 (×20): qty 2

## 2013-10-13 MED ORDER — SENNA 8.6 MG PO TABS
1.0000 | ORAL_TABLET | Freq: Every day | ORAL | Status: DC
  Administered 2013-10-14 – 2013-10-27 (×12): 8.6 mg via ORAL
  Filled 2013-10-13 (×15): qty 1

## 2013-10-13 NOTE — Care Management Note (Unsigned)
    Page 1 of 1   10/13/2013     4:02:53 PM CARE MANAGEMENT NOTE 10/13/2013  Patient:  [name], [name]   Account Number:  [id number]  Date Initiated:  10/10/2013  Documentation initiated by:  Marvetta Gibbons  Subjective/Objective Assessment:   Pt admitted s/p fall with left frontal lobe mass found on CT     Action/Plan:   PTA pt lived at home- NCM to follow for d/c needs   Anticipated DC Date:  10/13/2013   Anticipated DC Plan:        [place]  CM consult      Choice offered to / List presented to:             Status of service:  In process, will continue to follow Medicare Important Message given?  YES (If response is "NO", the following Medicare IM given date fields will be blank) Date Medicare IM given:  10/13/2013 Date Additional Medicare IM given:    Discharge Disposition:    Per UR Regulation:  Reviewed for med. necessity/level of care/duration of stay  If discussed at [place] of Stay Meetings, dates discussed:    Comments:  10/13/13 [place], MSN, CM- Met with patient to provide Medicare IM letter. Patient requested that CM sign due to fatigue, but verbalized understanding.  Copy left at bedside.   10/10/13- 1200- Marvetta Gibbons RN, BSN [phone] Admission Medicare IM given 10/09/13

## 2013-10-13 NOTE — Progress Notes (Signed)
Physical Therapy Treatment Patient Details Name: [name] MRN: [id number] DOB: [birth year] Today's Date: 10/13/2013    History of Present Illness 80 y.o. female admitted to [facility] on 10/09/13 with recurrent falls.  Found to have new T12 compression fx.  MRI revealed Acute right hemispheric subdural hematoma, and Meningioma in the right frontal lobe.  Neurosurgery consulted and planned brain surgery.    PT Comments    Pt reports significant back pain.  Continues to be unsteady on her feet and quick to move despite balance deficits.  She is at high risk for falls without assistance.  PT will continue to follow acutely.  We did discuss possible CIR level therapies depending on how she does after surgery.    Follow Up Recommendations  Home health PT     Equipment Recommendations  None recommended by PT    Recommendations for Other Services   NA     Precautions / Restrictions Precautions Precautions: Fall Precaution Comments: h/o multiple falls    Mobility  Bed Mobility Overal bed mobility: Modified Independent             General bed mobility comments: uses bed rail for leverage  Transfers Overall transfer level: Needs assistance Equipment used: None Transfers: Sit to/from Stand Sit to Stand: Supervision         General transfer comment: supervision for safety due to heavy [place] on hands for transitions  Ambulation/Gait Ambulation/Gait assistance: Min assist;Min guard Ambulation Distance (Feet): 250 Feet Assistive device: None Gait Pattern/deviations: Step-through pattern;Staggering left;Staggering right     General Gait Details: Pt with staggering gait pattern, worse with turns requiring min assist on one turn (180) in the hall to prevent LOB.         Balance Overall balance assessment: Needs assistance Sitting-balance support: Feet supported Sitting balance-Leahy Scale: Fair     Standing balance support: No upper extremity supported Standing  balance-Leahy Scale: Fair                      Cognition Arousal/Alertness: Awake/alert Behavior During Therapy: Impulsive Overall Cognitive Status: Within Functional Limits for tasks assessed                             Pertinent Vitals/Pain See vitals flow sheet.            PT Goals (current goals can now be found in the care plan section) Acute Rehab PT Goals Patient Stated Goal: to go home after all of this Progress towards PT goals: Progressing toward goals    Frequency  Min 3X/week    PT Plan Current plan remains appropriate       End of Session   Activity Tolerance: Patient limited by pain Patient left: in bed;with call bell/phone within reach;with bed alarm set     Time: 1730-1751 PT Time Calculation (min): 21 min  Charges:  $Gait Training: 8-22 mins            [name], [place], DPT [phone]   10/13/2013, 5:57 PM

## 2013-10-13 NOTE — Progress Notes (Addendum)
[facility] PROGRESS NOTE  Cerys Winget Medel QIH:[id number] DOB: [birth year] DOA: 10/09/2013 PCP: Georgetta Haber, MD  Brief summary  80 y.o. F w/ Hx anxiety, irritable bowel syndrome, HTN, CAD, asthma, HLD, Diabetes Type 2, Breast cancer (DCIS) status post right lumpectomy and postlumpectomy radiation therapy, and recurrent syncope, fall evaluated by Cardiology with plans for a loop recorder inserted at 12:00 the day of her admit, who fell around 9:30 a.m. and couldn't get up. She was found by family around 1:00 pm on the floor. EMS brought her to [facility] for further evaluation. The patient reported that she also fell 2 days prior, sustaining head trauma (right sided) and trauma to her back. She has had back pain and headache since the first fall.   Assessment/Plan:  1. Subdural hemorrhage  -SDH secondary to trauma, but CT showed an incidentally discovered right frontal lobe mass (see below)  -Decadron 20 mg X 1 followed by 6 mg Q 6 hours/taper per neurosurgery   -Neurosurgery following; repeat CT: no change  2. Right frontal lobe mass  -MRI confirms mass is c/w a meningioma, and suggests is causing 29mm shift to L  -per neurosurgery: resection of meningioma Monday of next week  3. T 12 compression fracture  -Pain control; cont lidocaine patch, K pad, increased oxycontine 10 prn; monitor for side effects'  PT/OT: [facility] PT   4. DM w/o Complication Type II; OV5I-4.3;   -uncontrolled due to steroids; started lantus increased 18 units +ISS: hold PO meds; monitor on ISS  5. HPL; Continue Colestid  6. HTN; BP currently well controlled - follow trend  6. GERD; Continue H2 blocker  7. Syncope recurrent fall; monitor on tele'; echo (09/2013): LVEF normal; carotid doppler unremarkable (08/2011) -Outpatient loop recorder to be reconsidered post-op in outpt setting  -PT ot eval: [facility] PT 8. Anxiety; Continue Xanax. 0.25 mg QHS PRN -? Steroid side effect with worsening anxiety; taper steroids;  monitor  9.  Hypo Na, ? Cerebral salt wasting; obtain, urine Na, osm;  -improved on IVF; IV NS as needed ;    Code Status: full Family Communication: d/w patient, her brother  (indicate person spoken with, relationship, and if by phone, the number) Disposition Plan: pend clinical improvement    Consultants:  Neurosurgery  Consultants:  Dr. Leeroy Cha (neurosurgery)  Procedure/Significant Events:  6/8 DG L-spine; New T12 wedge compression deformity with 50% loss of height anteriorly/some loss height of the posterior margin  6/8 CT head without contrast  -Acute, posttraumatic extra-axial hemorrhage along the anterior right convexity, favor subdural hematoma and measuring up to 9 mm in thickness.  -Mass effect and vasogenic edema in anterior right frontal lobe which appears related to large inferior right frontal lobe mass. Favor large extra-axial meningioma,  - leftward midline shift of 6 mm.  6/9 MRI Brain  Acute right hemispheric subdural hematoma.  Meningioma in the right frontal lobe along the roof of the right  orbit. This extends across the cribriform plate with mild extension  to the left of midline. There is vasogenic edema and a peritumoral  cyst above the mass. 7 mm midline shift to the left.  Culture  6/8 MRSA by PCR negative  Antibiotics:  NA   HPI/Subjective: alert  Objective: Filed Vitals:   10/13/13 1002  BP: 157/78  Pulse: 59  Temp: 97.3 F (36.3 C)  Resp: 20    Intake/Output Summary (Last 24 hours) at 10/13/13 1021 Last data filed at 10/13/13 0829  Gross per 24 hour  Intake    360 ml  Output    900 ml  Net   -540 ml   Filed Weights   10/09/13 2206  Weight: 61.6 kg (135 lb 12.9 oz)    Exam:   General:  alert  Cardiovascular: s1,s2 rrr  Respiratory: CTA BL  Abdomen: soft, nt, nd   Musculoskeletal: no LE edema  Data Reviewed: Basic Metabolic Panel:  Recent Labs Lab 10/09/13 1551 10/10/13 0250 10/12/13 0449 10/13/13 0632  NA 136* 132*  131* 135*  K 3.6* 3.9 4.2 4.4  CL 100 93* 95* 100  CO2 24 22 21 22   GLUCOSE 203* 325* 289* 304*  BUN 8 9 19 19   CREATININE 0.69 0.60 0.78 0.68  CALCIUM 9.1 9.0 8.4 8.2*   Liver Function Tests:  Recent Labs Lab 10/10/13 0250  AST 11  ALT 10  ALKPHOS 72  BILITOT 0.5  PROT 6.6  ALBUMIN 3.5   No results found for this basename: LIPASE, AMYLASE,  in the last 168 hours No results found for this basename: AMMONIA,  in the last 168 hours CBC:  Recent Labs Lab 10/09/13 1551 10/10/13 0250 10/12/13 0449  WBC 7.3 8.2 10.1  NEUTROABS 5.7  --   --   HGB 11.6* 12.1 10.3*  HCT 33.8* 35.1* 30.4*  MCV 86.9 86.9 87.6  PLT 231 219 252   Cardiac Enzymes: No results found for this basename: CKTOTAL, CKMB, CKMBINDEX, TROPONINI,  in the last 168 hours BNP (last 3 results) No results found for this basename: PROBNP,  in the last 8760 hours CBG:  Recent Labs Lab 10/11/13 2224 10/12/13 0653 10/12/13 1218 10/12/13 1648 10/13/13 0655  GLUCAP 276* 295* 375* 299* 276*    Recent Results (from the past 240 hour(s))  MRSA PCR SCREENING     Status: None   Collection Time    10/09/13 11:16 PM      Result Value Ref Range Status   MRSA by PCR NEGATIVE  NEGATIVE Final   Comment:            The GeneXpert MRSA Assay (FDA     approved for NASAL specimens     only), is one component of a     comprehensive MRSA colonization     surveillance program. It is not     intended to diagnose MRSA     infection nor to guide or     monitor treatment for     MRSA infections.     Studies: Ct Head Wo Contrast  10/13/2013   CLINICAL DATA:  Fall.  Followup subdural hemorrhage.  EXAM: CT HEAD WITHOUT CONTRAST  TECHNIQUE: Contiguous axial images were obtained from the base of the skull through the vertex without intravenous contrast.  COMPARISON:  MRI 10/10/2013, CT 10/09/2013  FINDINGS: Right-sided high-density subdural hematoma is unchanged measuring up to 8.3 mm in thickness. No new hemorrhage.  Right  frontal meningioma also unchanged from prior studies measuring 3.8 x 4.6 cm. There is vasogenic edema in the right frontal lobe.  No hydrocephalus. Mild midline shift is unchanged related to the subdural hemorrhage and the meningioma. Midline shift measures approximately 4.0 mm. No acute infarct.  IMPRESSION: Right-sided subdural hematoma unchanged.  No new hemorrhage  Right frontal meningioma unchanged.  Mild midline shift to the left unchanged.   Electronically Signed   By: Franchot Gallo M.D.   On: 10/13/2013 07:47    Scheduled Meds: . calcium carbonate  1 tablet Oral Q breakfast  . cholecalciferol  1,000 Units Oral Daily  . colestipol  2 g Oral BID  . dexamethasone  4 mg Oral BID  . famotidine  20 mg Oral BID  . ferrous sulfate  325 mg Oral Q breakfast  . insulin aspart  0-9 Units Subcutaneous TID WC  . insulin glargine  10 Units Subcutaneous QHS  . lidocaine  1 patch Transdermal Q24H  . loratadine  10 mg Oral Daily  . omega-3 acid ethyl esters  1 g Oral Daily  . trandolapril  2 mg Oral Daily  . verapamil  180 mg Oral Daily   Continuous Infusions: . sodium chloride 75 mL/hr at 10/12/13 1347    Principal Problem:   Subdural hemorrhage Active Problems:   DM w/o Complication Type II   HYPERLIPIDEMIA   HYPERTENSION   GERD   Syncope   Carotid artery disease   Right frontal lobe mass   Anxiety   Compression fracture of T12 vertebra    Time spent: >35 minutes     Kinnie Feil  [facility] Pager [phone]. If 7PM-7AM, please contact night-coverage at [email or web address], password [facility] 10/13/2013, 10:21 AM  LOS: 4 days

## 2013-10-13 NOTE — Progress Notes (Signed)
UR complete.  Jaimi Belle RN, MSN 

## 2013-10-14 ENCOUNTER — Inpatient Hospital Stay (HOSPITAL_COMMUNITY): Payer: Medicare Other

## 2013-10-14 LAB — BLOOD GAS, ARTERIAL
Acid-base deficit: 1.3 mmol/L (ref 0.0–2.0)
Bicarbonate: 22.3 mEq/L (ref 20.0–24.0)
Drawn by: 248681
O2 Content: 4 L/min
O2 Saturation: 99.9 %
Patient temperature: 98.6
TCO2: 23.3 mmol/L (ref 0–100)
pCO2 arterial: 33.5 mmHg — ABNORMAL LOW (ref 35.0–45.0)
pH, Arterial: 7.438 (ref 7.350–7.450)
pO2, Arterial: 115 mmHg — ABNORMAL HIGH (ref 80.0–100.0)

## 2013-10-14 LAB — COMPREHENSIVE METABOLIC PANEL
ALT: 16 U/L (ref 0–35)
AST: 17 U/L (ref 0–37)
Albumin: 2.7 g/dL — ABNORMAL LOW (ref 3.5–5.2)
Alkaline Phosphatase: 56 U/L (ref 39–117)
BUN: 19 mg/dL (ref 6–23)
CO2: 22 mEq/L (ref 19–32)
Calcium: 8.7 mg/dL (ref 8.4–10.5)
Chloride: 93 mEq/L — ABNORMAL LOW (ref 96–112)
Creatinine, Ser: 0.7 mg/dL (ref 0.50–1.10)
GFR calc Af Amer: >90 mL/min (ref >90)
GFR calc non Af Amer: 79 mL/min — ABNORMAL LOW (ref >90)
Glucose, Bld: 222 mg/dL — ABNORMAL HIGH (ref 70–99)
Potassium: 4.2 mEq/L (ref 3.7–5.3)
Sodium: 132 mEq/L — ABNORMAL LOW (ref 137–147)
Total Bilirubin: 0.3 mg/dL (ref 0.3–1.2)
Total Protein: 5.4 g/dL — ABNORMAL LOW (ref 6.0–8.3)

## 2013-10-14 LAB — CBC
HCT: 32.2 % — ABNORMAL LOW (ref 36.0–46.0)
Hemoglobin: 11 g/dL — ABNORMAL LOW (ref 12.0–15.0)
MCH: 29.6 pg (ref 26.0–34.0)
MCHC: 34.2 g/dL (ref 30.0–36.0)
MCV: 86.8 fL (ref 78.0–100.0)
Platelets: 267 10*3/uL (ref 150–400)
RBC: 3.71 MIL/uL — ABNORMAL LOW (ref 3.87–5.11)
RDW: 13.7 % (ref 11.5–15.5)
WBC: 9.1 10*3/uL (ref 4.0–10.5)

## 2013-10-14 LAB — GLUCOSE, CAPILLARY
Glucose-Capillary: 214 mg/dL — ABNORMAL HIGH (ref 70–99)
Glucose-Capillary: 303 mg/dL — ABNORMAL HIGH (ref 70–99)
Glucose-Capillary: 161 mg/dL — ABNORMAL HIGH (ref 70–99)
Glucose-Capillary: 304 mg/dL — ABNORMAL HIGH (ref 70–99)

## 2013-10-14 MED ORDER — INSULIN GLARGINE 100 UNIT/ML ~~LOC~~ SOLN
20.0000 [IU] | Freq: Every day | SUBCUTANEOUS | Status: DC
  Administered 2013-10-14 – 2013-10-21 (×8): 20 [IU] via SUBCUTANEOUS
  Filled 2013-10-14 (×9): qty 0.2

## 2013-10-14 MED ORDER — LEVETIRACETAM 500 MG/5ML IV SOLN
500.0000 mg | Freq: Two times a day (BID) | INTRAVENOUS | Status: DC
  Filled 2013-10-14 (×2): qty 5

## 2013-10-14 MED ORDER — LEVETIRACETAM 500 MG/5ML IV SOLN
500.0000 mg | Freq: Two times a day (BID) | INTRAVENOUS | Status: DC
  Administered 2013-10-15 – 2013-10-19 (×9): 500 mg via INTRAVENOUS
  Filled 2013-10-14 (×13): qty 5

## 2013-10-14 MED ORDER — SODIUM CHLORIDE 0.9 % IV SOLN
1000.0000 mg | Freq: Once | INTRAVENOUS | Status: AC
  Administered 2013-10-14: 1000 mg via INTRAVENOUS
  Filled 2013-10-14: qty 10

## 2013-10-14 MED ORDER — LORAZEPAM 2 MG/ML IJ SOLN: 1.0000 mg | Freq: Once | INTRAMUSCULAR | Status: DC

## 2013-10-14 MED ORDER — BISACODYL 10 MG RE SUPP
10.0000 mg | Freq: Every day | RECTAL | Status: DC | PRN
  Administered 2013-10-15: 10 mg via RECTAL
  Filled 2013-10-14: qty 1

## 2013-10-14 NOTE — Progress Notes (Signed)
Overall stable. No new issues or problems. Examination stable.  Large right frontal meningioma. Plan surgical resection this coming week. No new recommendations. Continue steroids and

## 2013-10-14 NOTE — Progress Notes (Addendum)
RN noted patient was unresponsive as rn was leaving the patient's room. Patient was eating lunch with family member present in room and suddenly patient became unresponsive. Emergency alert was initiated and Rapid response nurse was called for further assessment of patient. RN noticed patient had food in her mouth and suction food out. MD notified and at bedside- Orders given and carried out per protocol. Patient not following commands. VSS and [place] was applied on 4L. RN will continue to monitor patient. Conley Rolls I RN

## 2013-10-14 NOTE — Progress Notes (Addendum)
[facility] PROGRESS NOTE  [name] DEY:[id number] DOB: December 13, 1929 DOA: 10/09/2013 PCP: Georgetta Haber, MD  Brief summary  80 y.o. F w/ Hx anxiety, irritable bowel syndrome, HTN, CAD, asthma, HLD, Diabetes Type 2, Breast cancer (DCIS) status post right lumpectomy and postlumpectomy radiation therapy, and recurrent syncope, fall evaluated by Cardiology with plans for a loop recorder inserted at 12:00 the day of her admit, who fell around 9:30 a.m. and couldn't get up. She was found by family around 1:00 pm on the floor. EMS brought her to [facility] for further evaluation. The patient reported that she also fell 2 days prior, sustaining head trauma (right sided) and trauma to her back. She has had back pain and headache since the first fall.   Assessment/Plan:  1. Subdural hemorrhage  -SDH secondary to trauma, but CT showed an incidentally discovered right frontal lobe mass (see below)  -Decadron 20 mg X 1 followed by 6 mg Q 6 hours/taper per neurosurgery   -Neurosurgery following; repeat CT: no change  2. Right frontal lobe mass  -MRI confirms mass is c/w a meningioma, and suggests is causing 38mm shift to L  -per neurosurgery: resection of meningioma Monday of next week  3. T 12 compression fracture  -Pain control; cont lidocaine patch, K pad, increased oxycontine 10 prn; monitor for side effects'  PT/OT: [facility] PT;  4. DM w/o Complication Type II; DJ4H-7.0;   -uncontrolled due to steroids; started lantus increased 18 units +ISS: hold PO meds; monitor on ISS  5. HPL; Continue Colestid  6. HTN; BP currently well controlled - follow trend  6. GERD; Continue H2 blocker  7. Syncope recurrent fall; monitor on tele'; echo (09/2013): LVEF normal; carotid doppler unremarkable (08/2011) -Outpatient loop recorder to be reconsidered post-op in outpt setting  -PT ot eval: [facility] PT 8. Anxiety; Continue Xanax. 0.25 mg QHS PRN -? Steroid side effect with worsening anxiety; taper steroids;  monitor  9.  Hypo Na, ? Cerebral salt wasting; obtain, urine Na, osm;  -improved on IVF; IV NS as needed ;  10. H/o diarrhea, now constipation; abd exam benign; hold colestid; added bowel regimen    Code Status: full Family Communication: d/w patient, her brother  (indicate person spoken with, relationship, and if by phone, the number) Disposition Plan: pend clinical improvement, OR per neurosurgery     Consultants:  Neurosurgery  Consultants:  Dr. Leeroy Cha (neurosurgery)  Procedure/Significant Events:  6/8 DG L-spine; New T12 wedge compression deformity with 50% loss of height anteriorly/some loss height of the posterior margin  6/8 CT head without contrast  -Acute, posttraumatic extra-axial hemorrhage along the anterior right convexity, favor subdural hematoma and measuring up to 9 mm in thickness.  -Mass effect and vasogenic edema in anterior right frontal lobe which appears related to large inferior right frontal lobe mass. Favor large extra-axial meningioma,  - leftward midline shift of 6 mm.  6/9 MRI Brain  Acute right hemispheric subdural hematoma.  Meningioma in the right frontal lobe along the roof of the right  orbit. This extends across the cribriform plate with mild extension  to the left of midline. There is vasogenic edema and a peritumoral  cyst above the mass. 7 mm midline shift to the left.  Culture  6/8 MRSA by PCR negative  Antibiotics:  NA   HPI/Subjective: alert  Objective: Filed Vitals:   10/14/13 0525  BP: 154/89  Pulse: 50  Temp: 97.8 F (36.6 C)  Resp: 18    Intake/Output Summary (  Last 24 hours) at 10/14/13 0909 Last data filed at 10/14/13 0700  Gross per 24 hour  Intake    360 ml  Output      0 ml  Net    360 ml   Filed Weights   10/09/13 2206  Weight: 61.6 kg (135 lb 12.9 oz)    Exam:   General:  alert  Cardiovascular: s1,s2 rrr  Respiratory: CTA BL  Abdomen: soft, nt, nd   Musculoskeletal: no LE edema  Data Reviewed: Basic  Metabolic Panel:  Recent Labs Lab 10/09/13 1551 10/10/13 0250 10/12/13 0449 10/13/13 0632  NA 136* 132* 131* 135*  K 3.6* 3.9 4.2 4.4  CL 100 93* 95* 100  CO2 24 22 21 22   GLUCOSE 203* 325* 289* 304*  BUN 8 9 19 19   CREATININE 0.69 0.60 0.78 0.68  CALCIUM 9.1 9.0 8.4 8.2*   Liver Function Tests:  Recent Labs Lab 10/10/13 0250  AST 11  ALT 10  ALKPHOS 72  BILITOT 0.5  PROT 6.6  ALBUMIN 3.5   No results found for this basename: LIPASE, AMYLASE,  in the last 168 hours No results found for this basename: AMMONIA,  in the last 168 hours CBC:  Recent Labs Lab 10/09/13 1551 10/10/13 0250 10/12/13 0449  WBC 7.3 8.2 10.1  NEUTROABS 5.7  --   --   HGB 11.6* 12.1 10.3*  HCT 33.8* 35.1* 30.4*  MCV 86.9 86.9 87.6  PLT 231 219 252   Cardiac Enzymes: No results found for this basename: CKTOTAL, CKMB, CKMBINDEX, TROPONINI,  in the last 168 hours BNP (last 3 results) No results found for this basename: PROBNP,  in the last 8760 hours CBG:  Recent Labs Lab 10/13/13 0655 10/13/13 1208 10/13/13 1634 10/13/13 2212 10/14/13 0701  GLUCAP 276* 261* 224* 318* 214*    Recent Results (from the past 240 hour(s))  MRSA PCR SCREENING     Status: None   Collection Time    10/09/13 11:16 PM      Result Value Ref Range Status   MRSA by PCR NEGATIVE  NEGATIVE Final   Comment:            The GeneXpert MRSA Assay (FDA     approved for NASAL specimens     only), is one component of a     comprehensive MRSA colonization     surveillance program. It is not     intended to diagnose MRSA     infection nor to guide or     monitor treatment for     MRSA infections.     Studies: Ct Head Wo Contrast  10/13/2013   CLINICAL DATA:  Fall.  Followup subdural hemorrhage.  EXAM: CT HEAD WITHOUT CONTRAST  TECHNIQUE: Contiguous axial images were obtained from the base of the skull through the vertex without intravenous contrast.  COMPARISON:  MRI 10/10/2013, CT 10/09/2013  FINDINGS:  Right-sided high-density subdural hematoma is unchanged measuring up to 8.3 mm in thickness. No new hemorrhage.  Right frontal meningioma also unchanged from prior studies measuring 3.8 x 4.6 cm. There is vasogenic edema in the right frontal lobe.  No hydrocephalus. Mild midline shift is unchanged related to the subdural hemorrhage and the meningioma. Midline shift measures approximately 4.0 mm. No acute infarct.  IMPRESSION: Right-sided subdural hematoma unchanged.  No new hemorrhage  Right frontal meningioma unchanged.  Mild midline shift to the left unchanged.   Electronically Signed   By: Franchot Gallo M.D.  On: 10/13/2013 07:47    Scheduled Meds: . calcium carbonate  1 tablet Oral Q breakfast  . cholecalciferol  1,000 Units Oral Daily  . colestipol  2 g Oral BID  . dexamethasone  4 mg Oral BID  . famotidine  20 mg Oral BID  . ferrous sulfate  325 mg Oral Q breakfast  . insulin aspart  0-9 Units Subcutaneous TID WC  . insulin glargine  18 Units Subcutaneous QHS  . lidocaine  1 patch Transdermal Q24H  . loratadine  10 mg Oral Daily  . omega-3 acid ethyl esters  1 g Oral Daily  . polyethylene glycol  17 g Oral Daily  . senna  1 tablet Oral Daily  . trandolapril  2 mg Oral Daily  . verapamil  180 mg Oral Daily   Continuous Infusions:    Principal Problem:   Subdural hemorrhage Active Problems:   DM w/o Complication Type II   HYPERLIPIDEMIA   HYPERTENSION   GERD   Syncope   Carotid artery disease   Right frontal lobe mass   Anxiety   Compression fracture of T12 vertebra    Time spent: >35 minutes     Kinnie Feil  [facility] Pager [phone]. If 7PM-7AM, please contact night-coverage at [email or web address], password [facility] 10/14/2013, 9:09 AM  LOS: 5 days

## 2013-10-14 NOTE — Significant Event (Signed)
Rapid Response Event Note  Overview: Time Called: 1216 Arrival Time: 1220 Event Type: Neurologic  Initial Focused Assessment:  Called by Rn to evaluate patient who is suddenly unresponsive.  Upon  My arrival to patients room Rn and family at bedside, RN suctioning food out of patients mouth.  AS per Rn patient was eating lunch, Rn was speaking to patient and patient did not respond and noted her to be unresponsive.  Patient currently following some commands, not speaking.  VSS, on nasal cannula 4 lpm.     Interventions:  RN suctioning mouth, MD at bedside.  Orders given.  Rn to call if assistance needed   Event Summary:   at      at          Kennedy Bucker

## 2013-10-14 NOTE — Progress Notes (Addendum)
[facility] PROGRESS NOTE  [name] KDX:[id number] DOB: 01-06-1930 DOA: 10/09/2013 PCP: Georgetta Haber, MD  Assessment/Plan: Sudden change in mental status around 12.15 PM; Patient noted have less responsive, lethargic, but able to move extremities, does not follow commands; ? Episode of choking; VS stable, no hypoxia  -STAT CT head  f/u on intracranial mass, bleeding; CXR r/o aspiration; ABG; close monitor    [name]  [facility] Pager [phone]. If 7PM-7AM, please contact night-coverage at [email or web address], password [facility] 10/14/2013, 12:35 PM  LOS: 5 days     [id number];    CXR: no clear infiltrates,  CT head: Stable 8 mm right subdural hematoma. No new hemorrhage or  infarction. Stable large right frontal meningioma with base Janet edema in the right frontal lobe.  ? underlying seizure with brain mass, bleeding -start keprra, obtain EEG, called neurology evaluation   [name]  10/14/13 14.20

## 2013-10-14 NOTE — Consult Note (Addendum)
Neurology Consultation Reason for Consult: Episode of unresponsiveness Referring Physician: Daleen Bo, U  CC: Episode of unresponsiveness  History is obtained from: Brother, patient  HPI: [name] is a 80 y.o. female with a history of recurrent syncope and recent falls who was found to have a right-sided subdural hematoma as well as a large right meningioma. She has been started on steroids per neurosurgery and the plan is to remove this next week.  Earlier today, she was found to be competent to be unresponsive. She initially was completely unresponsive, but subsequently was confused and taken for CT scan. CT shows stable intracranial findings. Since returning from [place], her brother states that she has been sleepy but has seemed to come back close to her normal self.   ROS: A 14 point ROS was performed and is negative except as noted in the HPI.  Past Medical History  Diagnosis Date  . Seasonal allergies   . HTN (hypertension)   . Osteoarthritis   . Palpitations   . Anemia   . History of colonic polyps   . GERD (gastroesophageal reflux disease)   . Hyperlipidemia   . Chronic facial pain   . Vitamin B12 deficiency   . Dyslipidemia   . Chronic foot pain   . Chronic cough   . Obesity   . Anxiety   . Skin cancer   . Complication of anesthesia     Sore jaw; could not chew or move mouth  . Diabetes mellitus     type 2 niddm x 20 years  . Melanoma   . Metatarsal bone fracture 2014  . Hammer toe     bilateral  . Cancer of right breast 12/26/12    right breast 12:00 o'clock, DCIS  . Hx of radiation therapy 03/07/13- 03/29/13    right breast 4250 cGy 17 sessions  . Ejection fraction     Family History: No history of seizures  Social History: Tob: Denies  Exam: Current vital signs: BP 152/52  Pulse 54  Temp(Src) 98.5 F (36.9 C) (Oral)  Resp 18  Ht 5' 7.5" (1.715 m)  Wt 61.6 kg (135 lb 12.9 oz)  BMI 20.94 kg/m2  SpO2 100% Vital signs in last 24 hours: Temp:  [97.3 F  (36.3 C)-98.5 F (36.9 C)] 98.5 F (36.9 C) (06/13 1338) Pulse Rate:  [50-57] 54 (06/13 1338) Resp:  [16-18] 18 (06/13 1338) BP: (144-179)/(52-99) 152/52 mmHg (06/13 1338) SpO2:  [95 %-100 %] 100 % (06/13 1338)  General: In bed, NAD CV: Regular rate and rhythm Mental Status: Patient is awake, alert, oriented to person, place, month, year, and situation. Immediate and remote memory are intact. Patient is able to give a clear and coherent history. No signs of aphasia or neglect She is able to perform world backwards, but difficulty with serial sevens and remembering at 5 minutes. Cranial Nerves: II: Visual Fields are full in left eye decreased peripheral vision in right eye. Pupils are equal, round, and reactive to light.  Discs are difficult to visualize. III,IV, VI: EOMI without ptosis or diploplia with exception of abduction of right eye(old per pt) V: Facial sensation is symmetric to temperature VII: Facial movement is symmetric.  VIII: hearing is intact to voice X: Uvula elevates symmetrically XI: Shoulder shrug is symmetric. XII: tongue is midline without atrophy or fasciculations.  Motor: Tone is normal. Bulk is normal. 5/5 strength was present in all four extremities, though hip flexion is limited on the right due to pain. Sensory:  Sensation is symmetric to light touch and temperature in the arms and legs. Deep Tendon Reflexes: 2+ and symmetric in the biceps and patellae.  Cerebellar: Slow but intact finger-nose-finger bilaterally Gait: Not tested secondary to patient safety concerns        I have reviewed labs in epic and the results pertinent to this consultation are: Elevated glucose at 300  I have reviewed the images obtained: CT head- subdural hematoma and meningioma  Impression: 80 year old female with recurrent syncope of unclear etiology as well as loss of consciousness while seated today. Given that she has to possible seizure foci, as well as a  postictal period, I do think a seizure is high in the differential I would favor starting treatment for it at this time.  Recommendations: 1) EEG 2) Keppra 500 mg twice a day folate 1 g load 3) Will continue to follow  Roland Rack, MD [facility] [phone]  If 7pm- 7am, please page neurology on call as listed in [place].

## 2013-10-15 LAB — GLUCOSE, CAPILLARY
Glucose-Capillary: 181 mg/dL — ABNORMAL HIGH (ref 70–99)
Glucose-Capillary: 282 mg/dL — ABNORMAL HIGH (ref 70–99)
Glucose-Capillary: 384 mg/dL — ABNORMAL HIGH (ref 70–99)
Glucose-Capillary: 346 mg/dL — ABNORMAL HIGH (ref 70–99)

## 2013-10-15 MED ORDER — ACETAMINOPHEN 325 MG PO TABS
650.0000 mg | ORAL_TABLET | Freq: Four times a day (QID) | ORAL | Status: DC | PRN
  Administered 2013-10-20 – 2013-10-27 (×9): 650 mg via ORAL
  Filled 2013-10-15 (×11): qty 2

## 2013-10-15 NOTE — Progress Notes (Signed)
Subjective: Complains of abnormal smell, but not episodic. No further seizures  Exam: Filed Vitals:   10/15/13 0623  BP: 132/67  Pulse: 75  Temp: 98.3 F (36.8 C)  Resp: 18   Gen: In bed, NAD MS: Awake, alert, interactive and appropriate UO:HFGBM, EOMI with exception of abduction in right eye(old deficit) Motor: MAEW Sensory:intact to LT   Impression: 80 yo F with meningioma and SDH with episode of unresponsiveness as well as previous episodes of syncope of unclear etiology. I suspect seizure and she has been started on keppra. The current abnormal smell is unusual, but it is not episodic to suggest aura.   Recommendations: 1) Continue keppra 500mg  BID.  2) will continue to follow.   Roland Rack, MD [facility] [phone]  If 7pm- 7am, please page neurology on call as listed in [place].

## 2013-10-15 NOTE — Progress Notes (Addendum)
[facility] PROGRESS NOTE  [name] PRF:[id number] DOB: 1929-08-23 DOA: 10/09/2013 PCP: Georgetta Haber, MD  Brief summary  80 y.o. F w/ Hx anxiety, irritable bowel syndrome, HTN, CAD, asthma, HLD, Diabetes Type 2, Breast cancer (DCIS) status post right lumpectomy and postlumpectomy radiation therapy, and recurrent syncope, fall evaluated by Cardiology with plans for a loop recorder inserted at 12:00 the day of her admit, who fell around 9:30 a.m. and couldn't get up. She was found by family around 1:00 pm on the floor. EMS brought her to [facility] for further evaluation. The patient reported that she also fell 2 days prior, sustaining head trauma (right sided) and trauma to her back. She has had back pain and headache since the first fall.   Assessment/Plan:  1. Subdural hemorrhage  -SDH secondary to trauma, but CT showed an incidentally discovered right frontal lobe mass (see below)  -Decadron 20 mg X 1 followed by 6 mg Q 6 hours/taper per neurosurgery   -Neurosurgery following; repeat CT: no change  2. Right frontal lobe mass  -MRI confirms mass is c/w a meningioma, and suggests is causing 81mm shift to L  -per neurosurgery: resection of meningioma Monday of next week  3. T 12 compression fracture  -Pain control; cont lidocaine patch, K pad, increased oxycontine 10 prn; monitor for side effects'  PT/OT: [facility] PT;  4. DM w/o Complication Type II; DJ5T-0.1;   -uncontrolled due to steroids; started lantus increased 18 units +ISS: hold PO meds; monitor on ISS  5. HTN; BP currently well controlled - follow trend  6. Syncope recurrent fall; monitor on tele'; echo (09/2013): LVEF normal; carotid doppler unremarkable (08/2011) -Outpatient loop recorder to be reconsidered post-op in outpt setting  -PT ot eval: [facility] PT 7. Anxiety; Continue Xanax. 0.25 mg QHS PRN -? Steroid side effect with worsening anxiety; taper steroids;  monitor  8. Hypo Na, ? Cerebral salt wasting; obtain, urine Na, osm;   -improved on IVF; IV NS as needed ;  9. H/o diarrhea, now constipation; abd exam benign; hold colestid; added bowel regimen  10. Syncope vs seizure on 6/13; (more likely seizuire) -ABG-[id number]; CXR: no clear infiltrates, afebrile; monitor  -CT head: Stable 8 mm right subdural hematoma. No new hemorrhage or infarction. Stable large right frontal meningioma with base Janet edema in the right frontal lobe.  Likely underlying seizure with brain mass, bleeding  -started keprra, pend EEG, appreciate neurology evaluation   Code Status: full Family Communication: d/w patient, her brother  (indicate person spoken with, relationship, and if by phone, the number) Disposition Plan: pend clinical improvement, OR per neurosurgery     Consultants:  Neurosurgery  Consultants:  Dr. Leeroy Cha (neurosurgery)  Procedure/Significant Events:  6/8 DG L-spine; New T12 wedge compression deformity with 50% loss of height anteriorly/some loss height of the posterior margin  6/8 CT head without contrast  -Acute, posttraumatic extra-axial hemorrhage along the anterior right convexity, favor subdural hematoma and measuring up to 9 mm in thickness.  -Mass effect and vasogenic edema in anterior right frontal lobe which appears related to large inferior right frontal lobe mass. Favor large extra-axial meningioma,  - leftward midline shift of 6 mm.  6/9 MRI Brain  Acute right hemispheric subdural hematoma.  Meningioma in the right frontal lobe along the roof of the right  orbit. This extends across the cribriform plate with mild extension  to the left of midline. There is vasogenic edema and a peritumoral  cyst above the mass. 7 mm  midline shift to the left.  Culture  6/8 MRSA by PCR negative  Antibiotics:  NA   HPI/Subjective: alert  Objective: Filed Vitals:   10/15/13 0623  BP: 132/67  Pulse: 75  Temp: 98.3 F (36.8 C)  Resp: 18    Intake/Output Summary (Last 24 hours) at 10/15/13  0932 Last data filed at 10/14/13 2200  Gross per 24 hour  Intake    840 ml  Output      0 ml  Net    840 ml   Filed Weights   10/09/13 2206  Weight: 61.6 kg (135 lb 12.9 oz)    Exam:   General:  alert  Cardiovascular: s1,s2 rrr  Respiratory: CTA BL  Abdomen: soft, nt, nd   Musculoskeletal: no LE edema  Data Reviewed: Basic Metabolic Panel:  Recent Labs Lab 10/09/13 1551 10/10/13 0250 10/12/13 0449 10/13/13 0632 10/14/13 1340  NA 136* 132* 131* 135* 132*  K 3.6* 3.9 4.2 4.4 4.2  CL 100 93* 95* 100 93*  CO2 24 22 21 22 22   GLUCOSE 203* 325* 289* 304* 222*  BUN 8 9 19 19 19   CREATININE 0.69 0.60 0.78 0.68 0.70  CALCIUM 9.1 9.0 8.4 8.2* 8.7   Liver Function Tests:  Recent Labs Lab 10/10/13 0250 10/14/13 1340  AST 11 17  ALT 10 16  ALKPHOS 72 56  BILITOT 0.5 0.3  PROT 6.6 5.4*  ALBUMIN 3.5 2.7*   No results found for this basename: LIPASE, AMYLASE,  in the last 168 hours No results found for this basename: AMMONIA,  in the last 168 hours CBC:  Recent Labs Lab 10/09/13 1551 10/10/13 0250 10/12/13 0449 10/14/13 1340  WBC 7.3 8.2 10.1 9.1  NEUTROABS 5.7  --   --   --   HGB 11.6* 12.1 10.3* 11.0*  HCT 33.8* 35.1* 30.4* 32.2*  MCV 86.9 86.9 87.6 86.8  PLT 231 219 252 267   Cardiac Enzymes: No results found for this basename: CKTOTAL, CKMB, CKMBINDEX, TROPONINI,  in the last 168 hours BNP (last 3 results) No results found for this basename: PROBNP,  in the last 8760 hours CBG:  Recent Labs Lab 10/14/13 0701 10/14/13 1210 10/14/13 1634 10/14/13 2136 10/15/13 0644  GLUCAP 214* 303* 161* 304* 181*    Recent Results (from the past 240 hour(s))  MRSA PCR SCREENING     Status: None   Collection Time    10/09/13 11:16 PM      Result Value Ref Range Status   MRSA by PCR NEGATIVE  NEGATIVE Final   Comment:            The GeneXpert MRSA Assay (FDA     approved for NASAL specimens     only), is one component of a     comprehensive MRSA  colonization     surveillance program. It is not     intended to diagnose MRSA     infection nor to guide or     monitor treatment for     MRSA infections.     Studies: Ct Head Wo Contrast  10/14/2013   CLINICAL DATA:  Mental status changes.  Decreasing responsiveness.  EXAM: CT HEAD WITHOUT CONTRAST  TECHNIQUE: Contiguous axial images were obtained from the base of the skull through the vertex without intravenous contrast.  COMPARISON:  10/13/2013.  MRI 10/10/2013.  FINDINGS: Large mass again noted in the right frontal region measuring up to approximately 4.4 cm. This was shown on prior MRI  to represent a meningioma. Surrounding vasogenic edema of the right frontal lobe.  Right high density subdural he hematoma is again noted measuring 8 mm in thickness, unchanged. No new areas of hemorrhage. No hydrocephalus. Mild midline shift mainly in the right frontal region is again noted, likely due to both the subdural hematoma and the meningioma.  No acute calvarial abnormality.  IMPRESSION: Stable 8 mm right subdural hematoma. No new hemorrhage or infarction.  Stable large right frontal meningioma with base Janet edema in the right frontal lobe.   Electronically Signed   By: Rolm Baptise M.D.   On: 10/14/2013 13:40   Dg Chest Port 1 View  10/14/2013   CLINICAL DATA:  Rule out aspiration.  EXAM: PORTABLE CHEST - 1 VIEW  COMPARISON:  08/06/2013  FINDINGS: Heart size and mediastinal contours within normal limits for technique. Mild streaky retrocardiac opacity. No pulmonary edema. No effusion or pneumothorax. No acute osseous findings.  IMPRESSION: Mild atelectasis in the retrocardiac lung.   Electronically Signed   By: Jorje Guild M.D.   On: 10/14/2013 13:30    Scheduled Meds: . calcium carbonate  1 tablet Oral Q breakfast  . cholecalciferol  1,000 Units Oral Daily  . dexamethasone  4 mg Oral BID  . famotidine  20 mg Oral BID  . ferrous sulfate  325 mg Oral Q breakfast  . insulin aspart  0-9 Units  Subcutaneous TID WC  . insulin glargine  20 Units Subcutaneous QHS  . levETIRAcetam  500 mg Intravenous Q12H  . lidocaine  1 patch Transdermal Q24H  . loratadine  10 mg Oral Daily  . omega-3 acid ethyl esters  1 g Oral Daily  . polyethylene glycol  17 g Oral Daily  . senna  1 tablet Oral Daily  . trandolapril  2 mg Oral Daily  . verapamil  180 mg Oral Daily   Continuous Infusions:    Principal Problem:   Subdural hemorrhage Active Problems:   DM w/o Complication Type II   HYPERLIPIDEMIA   HYPERTENSION   GERD   Syncope   Carotid artery disease   Right frontal lobe mass   Anxiety   Compression fracture of T12 vertebra    Time spent: >35 minutes     Kinnie Feil  [facility] Pager [phone]. If 7PM-7AM, please contact night-coverage at [email or web address], password [facility] 10/15/2013, 9:32 AM  LOS: 6 days

## 2013-10-15 NOTE — Progress Notes (Signed)
Patient with seizure episode yesterday without adverse event. Have returned to baseline. Followup head CT scan stable.  Patient with significant right frontal meningioma. Plan surgical resection this week. No new recommendations. Continue Keppra for seizure control.

## 2013-10-16 ENCOUNTER — Inpatient Hospital Stay (HOSPITAL_COMMUNITY): Payer: Medicare Other

## 2013-10-16 LAB — GLUCOSE, CAPILLARY
Glucose-Capillary: 215 mg/dL — ABNORMAL HIGH (ref 70–99)
Glucose-Capillary: 248 mg/dL — ABNORMAL HIGH (ref 70–99)
Glucose-Capillary: 401 mg/dL — ABNORMAL HIGH (ref 70–99)
Glucose-Capillary: 253 mg/dL — ABNORMAL HIGH (ref 70–99)

## 2013-10-16 MED ORDER — HYDRALAZINE HCL 20 MG/ML IJ SOLN: 10.0000 mg | Freq: Four times a day (QID) | INTRAMUSCULAR | Status: DC | PRN

## 2013-10-16 MED ORDER — INSULIN ASPART 100 UNIT/ML ~~LOC~~ SOLN
4.0000 [IU] | Freq: Three times a day (TID) | SUBCUTANEOUS | Status: DC
  Administered 2013-10-16 – 2013-10-27 (×31): 4 [IU] via SUBCUTANEOUS

## 2013-10-16 MED ORDER — INSULIN ASPART 100 UNIT/ML ~~LOC~~ SOLN
0.0000 [IU] | Freq: Three times a day (TID) | SUBCUTANEOUS | Status: DC
  Administered 2013-10-16: 15 [IU] via SUBCUTANEOUS
  Administered 2013-10-17: 3 [IU] via SUBCUTANEOUS
  Administered 2013-10-17: 2 [IU] via SUBCUTANEOUS
  Administered 2013-10-17: 15 [IU] via SUBCUTANEOUS
  Administered 2013-10-18: 5 [IU] via SUBCUTANEOUS
  Administered 2013-10-19 (×3): 8 [IU] via SUBCUTANEOUS
  Administered 2013-10-20 (×2): 5 [IU] via SUBCUTANEOUS
  Administered 2013-10-20: 8 [IU] via SUBCUTANEOUS
  Administered 2013-10-21: 5 [IU] via SUBCUTANEOUS
  Administered 2013-10-21: 8 [IU] via SUBCUTANEOUS
  Administered 2013-10-21: 11 [IU] via SUBCUTANEOUS
  Administered 2013-10-22 – 2013-10-23 (×3): 15 [IU] via SUBCUTANEOUS
  Administered 2013-10-23 – 2013-10-24 (×3): 11 [IU] via SUBCUTANEOUS
  Administered 2013-10-24: 8 [IU] via SUBCUTANEOUS
  Administered 2013-10-25 – 2013-10-26 (×3): 5 [IU] via SUBCUTANEOUS
  Administered 2013-10-26: 11 [IU] via SUBCUTANEOUS
  Administered 2013-10-27: 8 [IU] via SUBCUTANEOUS
  Administered 2013-10-27: 4 [IU] via SUBCUTANEOUS

## 2013-10-16 NOTE — Progress Notes (Signed)
Physical Therapy Treatment Patient Details Name: [name] MRN: [id number] DOB: [birth year] Today's Date: 10/16/2013    History of Present Illness 80 y.o. female admitted to [facility] on 10/09/13 with recurrent falls.  Found to have new T12 compression fx.  MRI revealed Acute right hemispheric subdural hematoma, and Meningioma in the right frontal lobe.  Neurosurgery consulted and planned brain surgery.    PT Comments    Pt needed some encourage to participate today due to increased back pain.  Pt deferred exercise in favor of walking in the halls only.  Follow Up Recommendations  Home health PT     Equipment Recommendations  None recommended by PT    Recommendations for Other Services       Precautions / Restrictions Precautions Precautions: Fall Precaution Comments: h/o multiple falls Restrictions Weight Bearing Restrictions: No    Mobility  Bed Mobility Overal bed mobility: Modified Independent                Transfers Overall transfer level: Needs assistance Equipment used: None Transfers: Sit to/from Stand Sit to Stand: Supervision         General transfer comment: mild unsteady on initial stand  Ambulation/Gait Ambulation/Gait assistance: Min guard Ambulation Distance (Feet): 700 Feet Assistive device: None Gait Pattern/deviations: Step-through pattern;Drifts right/left;Scissoring Gait velocity: prefers slower, but can increase speed when cued Gait velocity interpretation: Below normal speed for age/gender General Gait Details: mildly staggering gait with some scissoring and drifting.  As she increases speed and focus she can smooth out.  Scanning cause increased instability instantly.   Stairs            Wheelchair Mobility    Modified Rankin (Stroke Patients Only)       Balance Overall balance assessment: No apparent balance deficits (not formally assessed) Sitting-balance support: No upper extremity supported Sitting balance-Leahy Scale:  Good       Standing balance-Leahy Scale: Fair                      Cognition Arousal/Alertness: Awake/alert Behavior During Therapy: WFL for tasks assessed/performed Overall Cognitive Status: Within Functional Limits for tasks assessed                      Exercises      General Comments        Pertinent Vitals/Pain Back pain; on heating pad    Home Living                      Prior Function            PT Goals (current goals can now be found in the care plan section) Acute Rehab PT Goals Patient Stated Goal: to go home after all of this PT Goal Formulation: With patient Time For Goal Achievement: 10/24/13 Potential to Achieve Goals: Good Progress towards PT goals: Progressing toward goals    Frequency  Min 3X/week    PT Plan Current plan remains appropriate    Co-evaluation             End of Session   Activity Tolerance: Patient limited by pain Patient left: in bed;with call bell/phone within reach;with bed alarm set     Time: 5188-4166 PT Time Calculation (min): 19 min  Charges:  $Gait Training: 8-22 mins                    G Codes:  Ebelyn Bohnet, Tessie Fass 10/16/2013, 4:13 PM 10/16/2013  Donnella Sham, [place] [phone]  (pager)

## 2013-10-16 NOTE — Procedures (Signed)
ELECTROENCEPHALOGRAM REPORT  Date of Study: 10/16/2013  Patient's Name: [name] MRN: [id number] Date of Birth: [birth year]  Referring Provider: Dr. Rowe Clack  Clinical History: This is an 79 year old woman with recurrent syncope and falls found to have a right-sided subdural hematoma and large right meningioma, who had an episode of unresponsiveness followed by confusion  Medications: Keppra, Lantus, Senokot, Decadron, Lopressor, NovoLog, Mavik  Technical Summary: A multichannel digital EEG recording measured by the international 10-20 system with electrodes applied with paste and impedances below 5000 ohms performed as portable with EKG monitoring in an awake and asleep patient.  Hyperventilation and photic stimulation were not performed.  The digital EEG was referentially recorded, reformatted, and digitally filtered in a variety of bipolar and referential montages for optimal display.   Description: The patient is awake and asleep during the recording.  During maximal wakefulness, there is a symmetric, medium voltage 8 Hz posterior dominant rhythm that attenuates with eye opening. This is admixed with a moderate amount of diffuse 5-7 Hz theta and occasional bursts of diffuse 2-3 Hz delta slowing of the waking background. There is additional occasional focal 2-3 Hz delta slowing seen over the right frontocentrotemporal region, at times sharply contoured over the right frontopolar region without clear epileptogenic potential.  During drowsiness and sleep, there is an increase in theta slowing of the background with occasional vertex waves seen. Hyperventilation and photic stimulation were not performed. There were no clear epileptiform discharges or electrographic seizures seen.    EKG lead showed irregular rhythm.  Impression: This awake and asleep EEG is abnormal due to the presence of: 1. Mild to moderate diffuse slowing of the waking background  2. Additional occasional focal  slowing over the right frontocentrotemporal regions  Clinical Correlation of the above findings indicates bilateral cerebral dysfunction with additional focal cerebral dysfunction over the right frontocentral temporal region. Diffuse cerebral dysfunction is non-specific in etiology and can be seen with hypoxic/ischemic injury, toxic/metabolic encephalopathies, neurodegenerative disorders, or medication effect. Additional focal slowing over the right frontocentrotemporal region indicates focal cerebral dysfunction suggestive of underlying structural or physiologic abnormality. The absence of epileptiform discharges does not rule out a clinical diagnosis of epilepsy.  Clinical correlation is advised.   Ellouise Newer, M.D.

## 2013-10-16 NOTE — Progress Notes (Signed)
EEG completed at bedside.  Results pending. 

## 2013-10-16 NOTE — Progress Notes (Signed)
Subjective: No further seizures, no further smell.   Exam: Filed Vitals:   10/16/13 0611  BP: 173/74  Pulse: 50  Temp: 97.3 F (36.3 C)  Resp: 16   Gen: In bed, NAD MS: Awake, alert, interactive and appropriate, oriented to place, year, month QT:MAUQJ, EOMI with exception of abduction in right eye(old deficit) Motor: MAEW Sensory:intact to LT   Impression: 80 yo F with meningioma and SDH with episode of unresponsiveness as well as previous episodes of syncope of unclear etiology. I suspect seizure and she has been started on keppra.   Recommendations: 1) Continue keppra 500mg  BID.  2) will continue to follow.   Roland Rack, MD [facility] [phone]  If 7pm- 7am, please page neurology on call as listed in [place].

## 2013-10-16 NOTE — Progress Notes (Signed)
Inpatient Diabetes Program Recommendations  AACE/ADA: New Consensus Statement on Inpatient Glycemic Control (2013)  Target Ranges:  Prepandial:   less than 140 mg/dL      Peak postprandial:   less than 180 mg/dL (1-2 hours)      Critically ill patients:  140 - 180 mg/dL   Reason for Assessment:  Results for [name], MCCAFFERY ([name]) as of 10/16/2013 09:43  Ref. Range 10/15/2013 06:44 10/15/2013 11:31 10/15/2013 16:55 10/15/2013 21:39 10/16/2013 06:19  Glucose-Capillary Latest Range: 70-99 mg/dL 181 (H) 282 (H) 384 (H) 346 (H) 215 (H)   Diabetes history: Type 2 diabetes Outpatient Diabetes medications: Janumet [phone] mg daily Current orders for Inpatient glycemic control: Lantus 20 q HS, Novolog sensitive tid with meals  Note that patient is on Decadron 4 mg bid which is likely contributing to elevated CBG's.  Consider adding Novolog meal coverage 4 units tid with meals (Hold if patient eats less than 50%).    Thanks, Adah Perl, RN, BC-ADM Inpatient Diabetes Coordinator Pager [phone]

## 2013-10-16 NOTE — Progress Notes (Signed)
[facility] PROGRESS NOTE  [name] ATF:[id number] DOB: 07/21/1929 DOA: 10/09/2013 PCP: Georgetta Haber, MD  Brief summary  80 y.o. F w/ Hx anxiety, irritable bowel syndrome, HTN, CAD, asthma, HLD, Diabetes Type 2, Breast cancer (DCIS) status post right lumpectomy and postlumpectomy radiation therapy, and recurrent syncope, fall evaluated by Cardiology with plans for a loop recorder inserted at 12:00 the day of her admit, who fell around 9:30 a.m. and couldn't get up. She was found by family around 1:00 pm on the floor. EMS brought her to [facility] for further evaluation. The patient reported that she also fell 2 days prior, sustaining head trauma (right sided) and trauma to her back. She has had back pain and headache since the first fall.   Assessment/Plan:  1. Subdural hemorrhage  -SDH secondary to trauma, but CT showed an incidentally discovered right frontal lobe mass (see below)  -Decadron 20 mg X 1 followed by 6 mg Q 6 hours/taper per neurosurgery   -Neurosurgery following; repeat CT: no change  2. Right frontal lobe mass  -MRI confirms mass is c/w a meningioma, and suggests is causing 42mm shift to L  -per neurosurgery: resection of meningioma Monday of next week  3. T 12 compression fracture  -Pain control; cont lidocaine patch, K pad, increased oxycontine 10 prn; monitor for side effects'  PT/OT: [facility] PT;  4. DM w/o Complication Type II; YH0W-2.3;   -uncontrolled due to steroids; started lantus increased 20 units+as[part 4U +ISS: hold PO meds; monitor on ISS  5. HTN; BP currently well controlled - follow trend, prn hydralazine   6. Syncope recurrent fall; monitor on tele'; echo (09/2013): LVEF normal; carotid doppler unremarkable (08/2011) -Outpatient loop recorder to be reconsidered post-op in outpt setting  -PT ot eval: [facility] PT 7. Anxiety; Continue Xanax. 0.25 mg QHS PRN -? Steroid side effect with worsening anxiety; taper steroids;  monitor  8. Hypo Na, ? Cerebral salt wasting;  obtain, urine Na, osm;  -improved on IVF; IV NS as needed ;  9. H/o diarrhea, now constipation; abd exam benign; hold colestid; added bowel regimen  constipation resolved had BM on 6/14;  10. Probable seizure on 6/13;, started on keppra  -ABG-[id number]; CXR: no clear infiltrates, afebrile; monitor  -CT head: Stable 8 mm right subdural hematoma. No new hemorrhage or infarction. Stable large right frontal meningioma with base Janet edema in the right frontal lobe.  Likely underlying seizure with brain mass, bleeding  -started keprra, pend EEG, appreciate neurology evaluation   Code Status: full Family Communication: d/w patient, her brother  (indicate person spoken with, relationship, and if by phone, the number) Disposition Plan: pend clinical improvement, OR per neurosurgery  This week    Consultants:  Neurosurgery  Consultants:  Dr. Leeroy Cha (neurosurgery)  Procedure/Significant Events:  6/8 DG L-spine; New T12 wedge compression deformity with 50% loss of height anteriorly/some loss height of the posterior margin  6/8 CT head without contrast  -Acute, posttraumatic extra-axial hemorrhage along the anterior right convexity, favor subdural hematoma and measuring up to 9 mm in thickness.  -Mass effect and vasogenic edema in anterior right frontal lobe which appears related to large inferior right frontal lobe mass. Favor large extra-axial meningioma,  - leftward midline shift of 6 mm.  6/9 MRI Brain  Acute right hemispheric subdural hematoma.  Meningioma in the right frontal lobe along the roof of the right  orbit. This extends across the cribriform plate with mild extension  to the left of midline. There  is vasogenic edema and a peritumoral  cyst above the mass. 7 mm midline shift to the left.  Culture  6/8 MRSA by PCR negative  Antibiotics:  NA   HPI/Subjective: alert  Objective: Filed Vitals:   10/16/13 0611  BP: 173/74  Pulse: 50  Temp: 97.3 F (36.3 C)   Resp: 16    Intake/Output Summary (Last 24 hours) at 10/16/13 1010 Last data filed at 10/15/13 1700  Gross per 24 hour  Intake    360 ml  Output      0 ml  Net    360 ml   Filed Weights   10/09/13 2206  Weight: 61.6 kg (135 lb 12.9 oz)    Exam:   General:  alert  Cardiovascular: s1,s2 rrr  Respiratory: CTA BL  Abdomen: soft, nt, nd   Musculoskeletal: no LE edema  Data Reviewed: Basic Metabolic Panel:  Recent Labs Lab 10/09/13 1551 10/10/13 0250 10/12/13 0449 10/13/13 0632 10/14/13 1340  NA 136* 132* 131* 135* 132*  K 3.6* 3.9 4.2 4.4 4.2  CL 100 93* 95* 100 93*  CO2 24 22 21 22 22   GLUCOSE 203* 325* 289* 304* 222*  BUN 8 9 19 19 19   CREATININE 0.69 0.60 0.78 0.68 0.70  CALCIUM 9.1 9.0 8.4 8.2* 8.7   Liver Function Tests:  Recent Labs Lab 10/10/13 0250 10/14/13 1340  AST 11 17  ALT 10 16  ALKPHOS 72 56  BILITOT 0.5 0.3  PROT 6.6 5.4*  ALBUMIN 3.5 2.7*   No results found for this basename: LIPASE, AMYLASE,  in the last 168 hours No results found for this basename: AMMONIA,  in the last 168 hours CBC:  Recent Labs Lab 10/09/13 1551 10/10/13 0250 10/12/13 0449 10/14/13 1340  WBC 7.3 8.2 10.1 9.1  NEUTROABS 5.7  --   --   --   HGB 11.6* 12.1 10.3* 11.0*  HCT 33.8* 35.1* 30.4* 32.2*  MCV 86.9 86.9 87.6 86.8  PLT 231 219 252 267   Cardiac Enzymes: No results found for this basename: CKTOTAL, CKMB, CKMBINDEX, TROPONINI,  in the last 168 hours BNP (last 3 results) No results found for this basename: PROBNP,  in the last 8760 hours CBG:  Recent Labs Lab 10/15/13 0644 10/15/13 1131 10/15/13 1655 10/15/13 2139 10/16/13 0619  GLUCAP 181* 282* 384* 346* 215*    Recent Results (from the past 240 hour(s))  MRSA PCR SCREENING     Status: None   Collection Time    10/09/13 11:16 PM      Result Value Ref Range Status   MRSA by PCR NEGATIVE  NEGATIVE Final   Comment:            The GeneXpert MRSA Assay (FDA     approved for NASAL  specimens     only), is one component of a     comprehensive MRSA colonization     surveillance program. It is not     intended to diagnose MRSA     infection nor to guide or     monitor treatment for     MRSA infections.     Studies: Ct Head Wo Contrast  10/14/2013   CLINICAL DATA:  Mental status changes.  Decreasing responsiveness.  EXAM: CT HEAD WITHOUT CONTRAST  TECHNIQUE: Contiguous axial images were obtained from the base of the skull through the vertex without intravenous contrast.  COMPARISON:  10/13/2013.  MRI 10/10/2013.  FINDINGS: Large mass again noted in the right frontal  region measuring up to approximately 4.4 cm. This was shown on prior MRI to represent a meningioma. Surrounding vasogenic edema of the right frontal lobe.  Right high density subdural he hematoma is again noted measuring 8 mm in thickness, unchanged. No new areas of hemorrhage. No hydrocephalus. Mild midline shift mainly in the right frontal region is again noted, likely due to both the subdural hematoma and the meningioma.  No acute calvarial abnormality.  IMPRESSION: Stable 8 mm right subdural hematoma. No new hemorrhage or infarction.  Stable large right frontal meningioma with base Janet edema in the right frontal lobe.   Electronically Signed   By: Rolm Baptise M.D.   On: 10/14/2013 13:40   Dg Chest Port 1 View  10/14/2013   CLINICAL DATA:  Rule out aspiration.  EXAM: PORTABLE CHEST - 1 VIEW  COMPARISON:  08/06/2013  FINDINGS: Heart size and mediastinal contours within normal limits for technique. Mild streaky retrocardiac opacity. No pulmonary edema. No effusion or pneumothorax. No acute osseous findings.  IMPRESSION: Mild atelectasis in the retrocardiac lung.   Electronically Signed   By: Jorje Guild M.D.   On: 10/14/2013 13:30    Scheduled Meds: . calcium carbonate  1 tablet Oral Q breakfast  . cholecalciferol  1,000 Units Oral Daily  . dexamethasone  4 mg Oral BID  . famotidine  20 mg Oral BID  .  ferrous sulfate  325 mg Oral Q breakfast  . insulin aspart  0-9 Units Subcutaneous TID WC  . insulin glargine  20 Units Subcutaneous QHS  . levETIRAcetam  500 mg Intravenous Q12H  . lidocaine  1 patch Transdermal Q24H  . loratadine  10 mg Oral Daily  . omega-3 acid ethyl esters  1 g Oral Daily  . polyethylene glycol  17 g Oral Daily  . senna  1 tablet Oral Daily  . trandolapril  2 mg Oral Daily  . verapamil  180 mg Oral Daily   Continuous Infusions:    Principal Problem:   Subdural hemorrhage Active Problems:   DM w/o Complication Type II   HYPERLIPIDEMIA   HYPERTENSION   GERD   Syncope   Carotid artery disease   Right frontal lobe mass   Anxiety   Compression fracture of T12 vertebra    Time spent: >35 minutes     Kinnie Feil  [facility] Pager [phone]. If 7PM-7AM, please contact night-coverage at [email or web address], password [facility] 10/16/2013, 10:10 AM  LOS: 7 days

## 2013-10-17 LAB — GLUCOSE, CAPILLARY
Glucose-Capillary: 165 mg/dL — ABNORMAL HIGH (ref 70–99)
Glucose-Capillary: 143 mg/dL — ABNORMAL HIGH (ref 70–99)
Glucose-Capillary: 383 mg/dL — ABNORMAL HIGH (ref 70–99)
Glucose-Capillary: 326 mg/dL — ABNORMAL HIGH (ref 70–99)

## 2013-10-17 NOTE — Progress Notes (Signed)
Physical Therapy Treatment Patient Details Name: [name] MRN: [id number] DOB: [birth year] Today's Date: 10/17/2013    History of Present Illness 80 y.o. female admitted to [facility] on 10/09/13 with recurrent falls.  Found to have new T12 compression fx.  MRI revealed Acute right hemispheric subdural hematoma, and Meningioma in the right frontal lobe.  Neurosurgery consulted and planned brain surgery.    PT Comments     Needs lots of encouragement due to back pain.  Surgery planned for 6/17.   Follow Up Recommendations  Home health PT     Equipment Recommendations  None recommended by PT    Recommendations for Other Services       Precautions / Restrictions Precautions Precautions: Fall Precaution Comments: h/o multiple falls    Mobility  Bed Mobility Overal bed mobility: Modified Independent             General bed mobility comments: Reinforced best technique for bed mobility  Transfers Overall transfer level: Needs assistance Equipment used: None Transfers: Sit to/from Stand Sit to Stand: Supervision         General transfer comment: mild unsteady on initial stand  Ambulation/Gait Ambulation/Gait assistance: Supervision Ambulation Distance (Feet): 600 Feet Assistive device: None Gait Pattern/deviations: Step-through pattern   Gait velocity interpretation: Below normal speed for age/gender General Gait Details: mildly staggering gait with some scissoring and drifting.  As she increases speed and focus she can smooth out.   Stairs Stairs: Yes Stairs assistance: Supervision Stair Management: One rail Left;Alternating pattern;Forwards Number of Stairs: 5 General stair comments: steady with rail  Wheelchair Mobility    Modified Rankin (Stroke Patients Only)       Balance Overall balance assessment: Needs assistance Sitting-balance support: Feet supported;No upper extremity supported Sitting balance-Leahy Scale: Good     Standing balance support:  No upper extremity supported Standing balance-Leahy Scale: Fair                      Cognition Arousal/Alertness: Awake/alert Behavior During Therapy: WFL for tasks assessed/performed Overall Cognitive Status: Within Functional Limits for tasks assessed                      Exercises      General Comments        Pertinent Vitals/Pain 7/10 LBP managed by med and heat pad.     Home Living                      Prior Function            PT Goals (current goals can now be found in the care plan section) Acute Rehab PT Goals PT Goal Formulation: With patient Time For Goal Achievement: 10/24/13 Potential to Achieve Goals: Good Progress towards PT goals: Progressing toward goals    Frequency  Min 3X/week    PT Plan Current plan remains appropriate    Co-evaluation             End of Session   Activity Tolerance: Patient limited by pain Patient left: in bed;with call bell/phone within reach;with bed alarm set     Time: 1545-1605 PT Time Calculation (min): 20 min  Charges:  $Gait Training: 8-22 mins                    G Codes:      Khylee Algeo, Tessie Fass 10/17/2013, 4:21 PM 10/17/2013  Donnella Sham, PT [phone]  (pager)

## 2013-10-17 NOTE — Progress Notes (Signed)
i will be seeing [name] this pm. We are going to schedule her for resection of the tumor tomorrow.

## 2013-10-17 NOTE — Progress Notes (Signed)
UR COMPLETED  

## 2013-10-17 NOTE — Progress Notes (Signed)
[facility] PROGRESS NOTE  [name] [place] ONG:[id number] DOB: July 10, 1929 DOA: 10/09/2013 PCP: Georgetta Haber, MD  Brief summary  80 y.o. F w/ Hx anxiety, irritable bowel syndrome, HTN, CAD, asthma, HLD, Diabetes Type 2, Breast cancer (DCIS) status post right lumpectomy and postlumpectomy radiation therapy, and recurrent syncope, fall evaluated by Cardiology with plans for a loop recorder inserted at 12:00 the day of her admit, who fell around 9:30 a.m. and couldn't get up. She was found by family around 1:00 pm on the floor. EMS brought her to [facility] for further evaluation. The patient reported that she also fell 2 days prior, sustaining head trauma (right sided) and trauma to her back. She has had back pain and headache since the first fall.   Assessment/Plan:  1. Subdural hemorrhage  -SDH secondary to trauma, but CT showed an incidentally discovered right frontal lobe mass (see below)  -Decadron 20 mg X 1 followed by 6 mg Q 6 hours/taper per neurosurgery   -Neurosurgery following; repeat CT: no change  2. Right frontal lobe mass  -MRI confirms mass is c/w a meningioma, and suggests is causing 63mm shift to L  -awaiting neurosurgery to arrange resection of meningioma; this week  3. T 12 compression fracture  -Pain control; cont lidocaine patch, K pad, increased oxycontine 10 prn; monitor for side effects'  PT/OT: [facility] PT;  4. DM w/o Complication Type II; GM0N-0.2;   -uncontrolled due to steroids; started lantus increased 20 units+aspart 4U +ISS: hold PO meds; monitor on ISS  5. HTN; BP currently well controlled - follow trend, prn hydralazine   6. Syncope recurrent fall; monitor on tele'; echo (09/2013): LVEF normal; carotid doppler unremarkable (08/2011) -Outpatient loop recorder to be reconsidered post-op in outpt setting  -PT ot eval: [facility] PT 7. Anxiety; Continue Xanax. 0.25 mg QHS PRN -? Steroid side effect with worsening anxiety; tapering steroids;  monitor  8. Hypo Na, ? Cerebral salt  wasting; obtain, urine Na, osm;  -improved on IVF; IV NS as needed ;  9. H/o diarrhea, now constipation; abd exam benign; hold colestid; added bowel regimen  constipation resolved had BM on 6/14;  10. Probable seizure on 6/13;, Likely underlying seizure with brain mass, bleeding  -started keprra, appreciate neurology evaluation   Code Status: full Family Communication: d/w patient, her brother  (indicate person spoken with, relationship, and if by phone, the number) Disposition Plan: pend clinical improvement, OR per neurosurgery  This week    Consultants:  Neurosurgery  Consultants:  Dr. Leeroy Cha (neurosurgery)  Procedure/Significant Events:  6/8 DG L-spine; New T12 wedge compression deformity with 50% loss of height anteriorly/some loss height of the posterior margin  6/8 CT head without contrast  -Acute, posttraumatic extra-axial hemorrhage along the anterior right convexity, favor subdural hematoma and measuring up to 9 mm in thickness.  -Mass effect and vasogenic edema in anterior right frontal lobe which appears related to large inferior right frontal lobe mass. Favor large extra-axial meningioma,  - leftward midline shift of 6 mm.  6/9 MRI Brain  Acute right hemispheric subdural hematoma.  Meningioma in the right frontal lobe along the roof of the right  orbit. This extends across the cribriform plate with mild extension  to the left of midline. There is vasogenic edema and a peritumoral  cyst above the mass. 7 mm midline shift to the left.  Culture  6/8 MRSA by PCR negative  Antibiotics:  NA   HPI/Subjective: alert  Objective: Filed Vitals:   10/17/13 0936  BP: 131/69  Pulse: 64  Temp: 97.8 F (36.6 C)  Resp: 20    Intake/Output Summary (Last 24 hours) at 10/17/13 1032 Last data filed at 10/17/13 0900  Gross per 24 hour  Intake   1100 ml  Output      0 ml  Net   1100 ml   Filed Weights   10/09/13 2206  Weight: 61.6 kg (135 lb 12.9 oz)     Exam:   General:  alert  Cardiovascular: s1,s2 rrr  Respiratory: CTA BL  Abdomen: soft, nt, nd   Musculoskeletal: no LE edema  Data Reviewed: Basic Metabolic Panel:  Recent Labs Lab 10/12/13 0449 10/13/13 0632 10/14/13 1340  NA 131* 135* 132*  K 4.2 4.4 4.2  CL 95* 100 93*  CO2 21 22 22   GLUCOSE 289* 304* 222*  BUN 19 19 19   CREATININE 0.78 0.68 0.70  CALCIUM 8.4 8.2* 8.7   Liver Function Tests:  Recent Labs Lab 10/14/13 1340  AST 17  ALT 16  ALKPHOS 56  BILITOT 0.3  PROT 5.4*  ALBUMIN 2.7*   No results found for this basename: LIPASE, AMYLASE,  in the last 168 hours No results found for this basename: AMMONIA,  in the last 168 hours CBC:  Recent Labs Lab 10/12/13 0449 10/14/13 1340  WBC 10.1 9.1  HGB 10.3* 11.0*  HCT 30.4* 32.2*  MCV 87.6 86.8  PLT 252 267   Cardiac Enzymes: No results found for this basename: CKTOTAL, CKMB, CKMBINDEX, TROPONINI,  in the last 168 hours BNP (last 3 results) No results found for this basename: PROBNP,  in the last 8760 hours CBG:  Recent Labs Lab 10/16/13 0619 10/16/13 1158 10/16/13 1634 10/16/13 2131 10/17/13 0710  GLUCAP 215* 248* 401* 253* 165*    Recent Results (from the past 240 hour(s))  MRSA PCR SCREENING     Status: None   Collection Time    10/09/13 11:16 PM      Result Value Ref Range Status   MRSA by PCR NEGATIVE  NEGATIVE Final   Comment:            The GeneXpert MRSA Assay (FDA     approved for NASAL specimens     only), is one component of a     comprehensive MRSA colonization     surveillance program. It is not     intended to diagnose MRSA     infection nor to guide or     monitor treatment for     MRSA infections.     Studies: No results found.  Scheduled Meds: . calcium carbonate  1 tablet Oral Q breakfast  . cholecalciferol  1,000 Units Oral Daily  . dexamethasone  4 mg Oral BID  . famotidine  20 mg Oral BID  . ferrous sulfate  325 mg Oral Q breakfast  . insulin  aspart  0-15 Units Subcutaneous TID WC  . insulin aspart  4 Units Subcutaneous TID WC  . insulin glargine  20 Units Subcutaneous QHS  . levETIRAcetam  500 mg Intravenous Q12H  . lidocaine  1 patch Transdermal Q24H  . loratadine  10 mg Oral Daily  . omega-3 acid ethyl esters  1 g Oral Daily  . polyethylene glycol  17 g Oral Daily  . senna  1 tablet Oral Daily  . trandolapril  2 mg Oral Daily  . verapamil  180 mg Oral Daily   Continuous Infusions:    Principal Problem:   Subdural  hemorrhage Active Problems:   DM w/o Complication Type II   HYPERLIPIDEMIA   HYPERTENSION   GERD   Syncope   Carotid artery disease   Right frontal lobe mass   Anxiety   Compression fracture of T12 vertebra    Time spent: >35 minutes     Kinnie Feil  [facility] Pager [phone]. If 7PM-7AM, please contact night-coverage at [email or web address], password [facility] 10/17/2013, 10:32 AM  LOS: 8 days

## 2013-10-18 ENCOUNTER — Encounter (HOSPITAL_COMMUNITY): Payer: Self-pay | Admitting: Anesthesiology

## 2013-10-18 ENCOUNTER — Encounter (HOSPITAL_COMMUNITY)
Admission: EM | Disposition: A | Discharge status: Skilled Nursing Facility | Payer: Self-pay | Source: Home / Self Care | Attending: Neurosurgery

## 2013-10-18 ENCOUNTER — Inpatient Hospital Stay (HOSPITAL_COMMUNITY): Payer: Medicare Other | Admitting: Anesthesiology

## 2013-10-18 ENCOUNTER — Encounter (HOSPITAL_COMMUNITY): Payer: Medicare Other | Admitting: Anesthesiology

## 2013-10-18 DIAGNOSIS — D332 Benign neoplasm of brain, unspecified: Secondary | ICD-10-CM | POA: Diagnosis present

## 2013-10-18 HISTORY — PX: CRANIOTOMY: SHX93

## 2013-10-18 LAB — GLUCOSE, CAPILLARY
Glucose-Capillary: 233 mg/dL — ABNORMAL HIGH (ref 70–99)
Glucose-Capillary: 79 mg/dL (ref 70–99)
Glucose-Capillary: 338 mg/dL — ABNORMAL HIGH (ref 70–99)

## 2013-10-18 LAB — TYPE AND SCREEN
ABO/RH(D): A POS
Antibody Screen: NEGATIVE

## 2013-10-18 LAB — ABO/RH: ABO/RH(D): A POS

## 2013-10-18 SURGERY — CRANIOTOMY TUMOR EXCISION
Anesthesia: General | Laterality: Right

## 2013-10-18 MED ORDER — LACTATED RINGERS IV SOLN: INTRAVENOUS | Status: DC | PRN

## 2013-10-18 MED ORDER — BIOTENE DRY MOUTH MT LIQD
15.0000 mL | Freq: Two times a day (BID) | OROMUCOSAL | Status: DC
  Administered 2013-10-18 – 2013-10-27 (×17): 15 mL via OROMUCOSAL

## 2013-10-18 MED ORDER — LABETALOL HCL 5 MG/ML IV SOLN
INTRAVENOUS | Status: DC | PRN
  Administered 2013-10-18 (×2): 10 mg via INTRAVENOUS

## 2013-10-18 MED ORDER — DEXTROSE 5 % IV SOLN
INTRAVENOUS | Status: DC | PRN
Start: 2013-10-18 — End: 2013-10-18
  Administered 2013-10-18: 15:00:00 via INTRAVENOUS

## 2013-10-18 MED ORDER — SODIUM CHLORIDE 0.9 % IV SOLN
INTRAVENOUS | Status: DC
  Administered 2013-10-18 – 2013-10-20 (×4): via INTRAVENOUS

## 2013-10-18 MED ORDER — FENTANYL CITRATE 0.05 MG/ML IJ SOLN
INTRAMUSCULAR | Status: DC | PRN
  Administered 2013-10-18 (×3): 100 ug via INTRAVENOUS

## 2013-10-18 MED ORDER — HYDROMORPHONE HCL PF 1 MG/ML IJ SOLN
0.2500 mg | INTRAMUSCULAR | Status: DC | PRN
  Administered 2013-10-18 – 2013-10-20 (×2): 0.5 mg via INTRAVENOUS
  Filled 2013-10-18 (×2): qty 1

## 2013-10-18 MED ORDER — MANNITOL 20 % IV SOLN
INTRAVENOUS | Status: DC | PRN
  Administered 2013-10-18: 16:00:00 via INTRAVENOUS

## 2013-10-18 MED ORDER — ROCURONIUM BROMIDE 100 MG/10ML IV SOLN
INTRAVENOUS | Status: DC | PRN
  Administered 2013-10-18: 50 mg via INTRAVENOUS

## 2013-10-18 MED ORDER — POVIDONE-IODINE 10 % EX OINT
TOPICAL_OINTMENT | CUTANEOUS | Status: DC | PRN
  Administered 2013-10-18: 1 via TOPICAL

## 2013-10-18 MED ORDER — ONDANSETRON HCL 4 MG/2ML IJ SOLN: 4.0000 mg | INTRAMUSCULAR | Status: DC | PRN

## 2013-10-18 MED ORDER — NALOXONE HCL 0.4 MG/ML IJ SOLN: 0.0800 mg | INTRAMUSCULAR | Status: DC | PRN

## 2013-10-18 MED ORDER — DEXAMETHASONE SODIUM PHOSPHATE 4 MG/ML IJ SOLN
4.0000 mg | Freq: Three times a day (TID) | INTRAMUSCULAR | Status: DC
  Filled 2013-10-18 (×2): qty 1

## 2013-10-18 MED ORDER — LACTATED RINGERS IV SOLN
INTRAVENOUS | Status: DC | PRN
Start: 2013-10-18 — End: 2013-10-18
  Administered 2013-10-18: 16:00:00 via INTRAVENOUS

## 2013-10-18 MED ORDER — GLYCOPYRROLATE 0.2 MG/ML IJ SOLN
INTRAMUSCULAR | Status: DC | PRN
  Administered 2013-10-18: 0.4 mg via INTRAVENOUS

## 2013-10-18 MED ORDER — DEXTROSE-NACL 5-0.45 % IV SOLN
INTRAVENOUS | Status: AC
  Administered 2013-10-18: 13:00:00 via INTRAVENOUS

## 2013-10-18 MED ORDER — THROMBIN 5000 UNITS EX SOLR
OROMUCOSAL | Status: DC | PRN
  Administered 2013-10-18 (×2): via TOPICAL

## 2013-10-18 MED ORDER — PANTOPRAZOLE SODIUM 40 MG IV SOLR
40.0000 mg | Freq: Every day | INTRAVENOUS | Status: DC
  Administered 2013-10-18: 40 mg via INTRAVENOUS
  Filled 2013-10-18 (×2): qty 40

## 2013-10-18 MED ORDER — ONDANSETRON HCL 4 MG/2ML IJ SOLN: 4.0000 mg | Freq: Once | INTRAMUSCULAR | Status: AC | PRN

## 2013-10-18 MED ORDER — LABETALOL HCL 5 MG/ML IV SOLN
INTRAVENOUS | Status: AC
  Filled 2013-10-18: qty 4

## 2013-10-18 MED ORDER — VANCOMYCIN HCL 1000 MG IV SOLR
1000.0000 mg | INTRAVENOUS | Status: DC | PRN
  Administered 2013-10-18: 1000 mg via INTRAVENOUS

## 2013-10-18 MED ORDER — SURGIFOAM 100 EX MISC
CUTANEOUS | Status: DC | PRN
  Administered 2013-10-18: 16:00:00 via TOPICAL

## 2013-10-18 MED ORDER — DEXAMETHASONE SODIUM PHOSPHATE 10 MG/ML IJ SOLN
INTRAMUSCULAR | Status: DC | PRN
  Administered 2013-10-18: 10 mg via INTRAVENOUS

## 2013-10-18 MED ORDER — PROPOFOL 10 MG/ML IV BOLUS
INTRAVENOUS | Status: AC
  Filled 2013-10-18: qty 20

## 2013-10-18 MED ORDER — ARTIFICIAL TEARS OP OINT
TOPICAL_OINTMENT | OPHTHALMIC | Status: DC | PRN
  Administered 2013-10-18: 1 via OPHTHALMIC

## 2013-10-18 MED ORDER — NEOSTIGMINE METHYLSULFATE 10 MG/10ML IV SOLN
INTRAVENOUS | Status: AC
  Filled 2013-10-18: qty 1

## 2013-10-18 MED ORDER — VANCOMYCIN HCL IN DEXTROSE 1-5 GM/200ML-% IV SOLN
INTRAVENOUS | Status: AC
  Filled 2013-10-18: qty 200

## 2013-10-18 MED ORDER — PROPOFOL 10 MG/ML IV BOLUS
INTRAVENOUS | Status: DC | PRN
  Administered 2013-10-18: 200 mg via INTRAVENOUS
  Administered 2013-10-18 (×2): 50 mg via INTRAVENOUS

## 2013-10-18 MED ORDER — NEOSTIGMINE METHYLSULFATE 10 MG/10ML IV SOLN
INTRAVENOUS | Status: DC | PRN
  Administered 2013-10-18: 3 mg via INTRAVENOUS

## 2013-10-18 MED ORDER — VECURONIUM BROMIDE 10 MG IV SOLR
INTRAVENOUS | Status: DC | PRN
  Administered 2013-10-18: 4 mg via INTRAVENOUS
  Administered 2013-10-18: 2 mg via INTRAVENOUS

## 2013-10-18 MED ORDER — DEXAMETHASONE SODIUM PHOSPHATE 4 MG/ML IJ SOLN
4.0000 mg | Freq: Four times a day (QID) | INTRAMUSCULAR | Status: DC
  Administered 2013-10-20 (×2): 4 mg via INTRAVENOUS
  Filled 2013-10-18 (×4): qty 1

## 2013-10-18 MED ORDER — BUPIVACAINE-EPINEPHRINE 0.5% -1:200000 IJ SOLN
INTRAMUSCULAR | Status: DC | PRN
  Administered 2013-10-18: 18 mL

## 2013-10-18 MED ORDER — 0.9 % SODIUM CHLORIDE (POUR BTL) OPTIME
TOPICAL | Status: DC | PRN
  Administered 2013-10-18 (×2): 1000 mL

## 2013-10-18 MED ORDER — LIDOCAINE HCL (CARDIAC) 20 MG/ML IV SOLN
INTRAVENOUS | Status: DC | PRN
  Administered 2013-10-18: 70 mg via INTRAVENOUS

## 2013-10-18 MED ORDER — MORPHINE SULFATE 2 MG/ML IJ SOLN
1.0000 mg | INTRAMUSCULAR | Status: DC | PRN
  Administered 2013-10-18: 1 mg via INTRAVENOUS
  Filled 2013-10-18: qty 1

## 2013-10-18 MED ORDER — SODIUM CHLORIDE 0.9 % IV SOLN
10.0000 mg | INTRAVENOUS | Status: DC | PRN
  Administered 2013-10-18: 25 ug/min via INTRAVENOUS

## 2013-10-18 MED ORDER — ALBUMIN HUMAN 5 % IV SOLN
INTRAVENOUS | Status: DC | PRN
Start: 2013-10-18 — End: 2013-10-18
  Administered 2013-10-18 (×2): via INTRAVENOUS

## 2013-10-18 MED ORDER — LABETALOL HCL 5 MG/ML IV SOLN: 10.0000 mg | INTRAVENOUS | Status: DC | PRN

## 2013-10-18 MED ORDER — FENTANYL CITRATE 0.05 MG/ML IJ SOLN
INTRAMUSCULAR | Status: AC
  Filled 2013-10-18: qty 5

## 2013-10-18 MED ORDER — SODIUM CHLORIDE 0.9 % IV SOLN
INTRAVENOUS | Status: DC | PRN
  Administered 2013-10-18 (×2): via INTRAVENOUS

## 2013-10-18 MED ORDER — SODIUM CHLORIDE 0.9 % IV SOLN: 500.0000 mg | Freq: Two times a day (BID) | INTRAVENOUS | Status: DC

## 2013-10-18 MED ORDER — ONDANSETRON HCL 4 MG/2ML IJ SOLN
INTRAMUSCULAR | Status: DC | PRN
  Administered 2013-10-18 (×2): 4 mg via INTRAVENOUS

## 2013-10-18 MED ORDER — ONDANSETRON HCL 4 MG PO TABS
4.0000 mg | ORAL_TABLET | ORAL | Status: DC | PRN
  Filled 2013-10-18: qty 1

## 2013-10-18 MED ORDER — EPHEDRINE SULFATE 50 MG/ML IJ SOLN
INTRAMUSCULAR | Status: DC | PRN
  Administered 2013-10-18: 10 mg via INTRAVENOUS

## 2013-10-18 MED ORDER — SODIUM CHLORIDE 0.9 % IV SOLN
500.0000 mg | Freq: Two times a day (BID) | INTRAVENOUS | Status: AC
  Administered 2013-10-19 (×2): 500 mg via INTRAVENOUS
  Filled 2013-10-18 (×2): qty 500

## 2013-10-18 MED ORDER — PROMETHAZINE HCL 25 MG PO TABS: 12.5000 mg | ORAL_TABLET | ORAL | Status: DC | PRN

## 2013-10-18 MED ORDER — DEXAMETHASONE SODIUM PHOSPHATE 10 MG/ML IJ SOLN
6.0000 mg | Freq: Four times a day (QID) | INTRAMUSCULAR | Status: AC
  Administered 2013-10-19 (×4): 6 mg via INTRAVENOUS
  Filled 2013-10-18 (×2): qty 1
  Filled 2013-10-18 (×2): qty 0.6

## 2013-10-18 MED ORDER — ONDANSETRON HCL 4 MG/2ML IJ SOLN
INTRAMUSCULAR | Status: AC
  Filled 2013-10-18: qty 2

## 2013-10-18 SURGICAL SUPPLY — 85 items
APL SKNCLS STERI-STRIP NONHPOA (GAUZE/BANDAGES/DRESSINGS) ×2
BENZOIN TINCTURE PRP APPL 2/3 (GAUZE/BANDAGES/DRESSINGS) ×2 IMPLANT
BIT DRILL WIRE PASS 1.3MM (BIT) IMPLANT
BLADE 10 SAFETY STRL DISP (BLADE) ×2 IMPLANT
BLADE ULTRA TIP 2M (BLADE) ×2 IMPLANT
BNDG GAUZE ELAST 4 BULKY (GAUZE/BANDAGES/DRESSINGS) IMPLANT
BRUSH SCRUB EZ 1% IODOPHOR (MISCELLANEOUS) ×2 IMPLANT
BUR ACORN 6.0 PRECISION (BURR) ×2 IMPLANT
BUR ADDG 1.1 (BURR) IMPLANT
BUR MATCHSTICK NEURO 3.0 LAGG (BURR) IMPLANT
BUR ROUTER D-58 CRANI (BURR) IMPLANT
CANISTER SUCT 3000ML (MISCELLANEOUS) ×2 IMPLANT
CLIP TI MEDIUM 6 (CLIP) IMPLANT
CONT SPEC 4OZ CLIKSEAL STRL BL (MISCELLANEOUS) ×4 IMPLANT
CORDS BIPOLAR (ELECTRODE) ×2 IMPLANT
COVER TABLE BACK 60X90 (DRAPES) ×2 IMPLANT
DECANTER SPIKE VIAL GLASS SM (MISCELLANEOUS) ×2 IMPLANT
DRAIN JACKSON PRATT 10MM FLAT (MISCELLANEOUS) ×1 IMPLANT
DRAIN SNY WOU 7FLT (WOUND CARE) IMPLANT
DRAIN SUBARACHNOID (WOUND CARE) IMPLANT
DRAPE CAMERA VIDEO/LASER (DRAPES) IMPLANT
DRAPE LONG LASER MIC (DRAPES) IMPLANT
DRAPE MICROSCOPE LEICA (MISCELLANEOUS) IMPLANT
DRAPE NEUROLOGICAL W/INCISE (DRAPES) ×2 IMPLANT
DRAPE PROXIMA HALF (DRAPES) ×1 IMPLANT
DRAPE SURG IRRIG POUCH 19X23 (DRAPES) IMPLANT
DRAPE WARM FLUID 44X44 (DRAPE) ×2 IMPLANT
DRILL WIRE PASS 1.3MM (BIT)
DURAPREP 6ML APPLICATOR 50/CS (WOUND CARE) IMPLANT
ELECT CAUTERY BLADE 6.4 (BLADE) ×2 IMPLANT
ELECT REM PT RETURN 9FT ADLT (ELECTROSURGICAL) ×2
ELECTRODE REM PT RTRN 9FT ADLT (ELECTROSURGICAL) ×1 IMPLANT
EVACUATOR 1/8 PVC DRAIN (DRAIN) IMPLANT
EVACUATOR SILICONE 100CC (DRAIN) ×1 IMPLANT
FORCEPS BIPOLAR SPETZLER 8 1.0 (NEUROSURGERY SUPPLIES) ×1 IMPLANT
GAUZE SPONGE 4X4 16PLY XRAY LF (GAUZE/BANDAGES/DRESSINGS) IMPLANT
GLOVE BIOGEL M 8.0 STRL (GLOVE) ×2 IMPLANT
GLOVE ECLIPSE 6.5 STRL STRAW (GLOVE) ×2 IMPLANT
GLOVE EXAM NITRILE LRG STRL (GLOVE) IMPLANT
GLOVE EXAM NITRILE MD LF STRL (GLOVE) IMPLANT
GLOVE EXAM NITRILE XL STR (GLOVE) IMPLANT
GLOVE EXAM NITRILE XS STR PU (GLOVE) IMPLANT
GOWN STRL REUS W/ TWL LRG LVL3 (GOWN DISPOSABLE) ×1 IMPLANT
GOWN STRL REUS W/ TWL XL LVL3 (GOWN DISPOSABLE) IMPLANT
GOWN STRL REUS W/TWL 2XL LVL3 (GOWN DISPOSABLE) IMPLANT
GOWN STRL REUS W/TWL LRG LVL3 (GOWN DISPOSABLE) ×2
GOWN STRL REUS W/TWL XL LVL3 (GOWN DISPOSABLE)
GRAFT DURAGEN MATRIX 2WX2L ×2 IMPLANT
HEMOSTAT SURGICEL 2X14 (HEMOSTASIS) IMPLANT
HOOK DURA (MISCELLANEOUS) ×2 IMPLANT
KIT BASIN OR (CUSTOM PROCEDURE TRAY) ×2 IMPLANT
KIT ROOM TURNOVER OR (KITS) ×2 IMPLANT
NDL HYPO 25X1 1.5 SAFETY (NEEDLE) ×1 IMPLANT
NEEDLE HYPO 25X1 1.5 SAFETY (NEEDLE) ×2 IMPLANT
NS IRRIG 1000ML POUR BTL (IV SOLUTION) ×2 IMPLANT
PACK CRANIOTOMY (CUSTOM PROCEDURE TRAY) ×2 IMPLANT
PAD EYE OVAL STERILE LF (GAUZE/BANDAGES/DRESSINGS) IMPLANT
PATTIES SURGICAL .25X.25 (GAUZE/BANDAGES/DRESSINGS) IMPLANT
PATTIES SURGICAL .5 X.5 (GAUZE/BANDAGES/DRESSINGS) IMPLANT
PATTIES SURGICAL .5 X3 (DISPOSABLE) IMPLANT
PATTIES SURGICAL 1/4 X 3 (GAUZE/BANDAGES/DRESSINGS) IMPLANT
PATTIES SURGICAL 1X1 (DISPOSABLE) IMPLANT
PLATE 1.5 4HOLE LONG STRAIGHT (Plate) ×2 IMPLANT
PLATE 1.5/0.5 18.5MM BURR HOLE (Plate) ×1 IMPLANT
RUBBERBAND STERILE (MISCELLANEOUS) IMPLANT
SCREW SELF DRILL HT 1.5/4MM (Screw) ×7 IMPLANT
SPONGE GAUZE 4X4 12PLY (GAUZE/BANDAGES/DRESSINGS) ×2 IMPLANT
SPONGE NEURO XRAY DETECT 1X3 (DISPOSABLE) IMPLANT
SPONGE SURGIFOAM ABS GEL 100 (HEMOSTASIS) IMPLANT
STAPLER SKIN PROX WIDE 3.9 (STAPLE) ×2 IMPLANT
SUT ETHILON 3 0 FSL (SUTURE) ×1 IMPLANT
SUT NURALON 4 0 TR CR/8 (SUTURE) ×6 IMPLANT
SUT SILK 0 TIES 10X30 (SUTURE) IMPLANT
SUT VIC AB 2-0 CP2 18 (SUTURE) ×3 IMPLANT
SYR 20ML ECCENTRIC (SYRINGE) ×2 IMPLANT
SYR CONTROL 10ML LL (SYRINGE) ×1 IMPLANT
TIP NONSTICK .5MMX23CM (INSTRUMENTS) ×2
TIP NONSTICK .5X23 (INSTRUMENTS) IMPLANT
TIP SONASTAR STD MISONIX 1.9 (TRAY / TRAY PROCEDURE) IMPLANT
TOWEL OR 17X24 6PK STRL BLUE (TOWEL DISPOSABLE) ×2 IMPLANT
TOWEL OR 17X26 10 PK STRL BLUE (TOWEL DISPOSABLE) ×3 IMPLANT
TRAY FOLEY CATH 14FRSI W/METER (CATHETERS) ×2 IMPLANT
TUBE CONNECTING 12X1/4 (SUCTIONS) ×2 IMPLANT
UNDERPAD 30X30 INCONTINENT (UNDERPADS AND DIAPERS) ×2 IMPLANT
WATER STERILE IRR 1000ML POUR (IV SOLUTION) ×2 IMPLANT

## 2013-10-18 NOTE — Anesthesia Procedure Notes (Addendum)
Procedure Name: Intubation Date/Time: 10/18/2013 3:05 PM Performed by: Jacquiline Doe A Pre-anesthesia Checklist: Patient identified, Timeout performed, Emergency Drugs available, Suction available and Patient being monitored Patient Re-evaluated:Patient Re-evaluated prior to inductionOxygen Delivery Method: Circle system utilized Preoxygenation: Pre-oxygenation with 100% oxygen Intubation Type: IV induction and Cricoid Pressure applied Ventilation: Mask ventilation without difficulty Laryngoscope Size: Mac and 3 Grade View: Grade I Tube type: Oral Tube size: 7.5 mm Number of attempts: 1 Airway Equipment and Method: Stylet and LTA kit utilized Secured at: 22 cm Tube secured with: Tape Dental Injury: Teeth and Oropharynx as per pre-operative assessment

## 2013-10-18 NOTE — Transfer of Care (Signed)
Immediate Anesthesia Transfer of Care Note  Patient: [name]  Procedure(s) Performed: Procedure(s): CRANIOTOMY TUMOR EXCISION (Right)  Patient Location: PACU  Anesthesia Type:General  Level of Consciousness: awake, alert , oriented and patient cooperative  Airway & Oxygen Therapy: Patient Spontanous Breathing and Patient connected to nasal cannula oxygen  Post-op Assessment: Report given to PACU RN, Post -op Vital signs reviewed and stable and Patient moving all extremities  Post vital signs: Reviewed and stable  Complications: No apparent anesthesia complications

## 2013-10-18 NOTE — Anesthesia Postprocedure Evaluation (Signed)
  Anesthesia Post-op Note  Patient: [name]  Procedure(s) Performed: Procedure(s): CRANIOTOMY TUMOR EXCISION (Right)  Patient Location: PACU  Anesthesia Type:General  Level of Consciousness: awake  Airway and Oxygen Therapy: Patient Spontanous Breathing  Post-op Pain: mild  Post-op Assessment: Post-op Vital signs reviewed  Post-op Vital Signs: Reviewed  Last Vitals:  Filed Vitals:   10/18/13 1252  BP: 135/60  Pulse: 66  Temp: 36.7 C  Resp: 20    Complications: No apparent anesthesia complications

## 2013-10-18 NOTE — Anesthesia Preprocedure Evaluation (Signed)
Anesthesia Evaluation  Patient identified by MRN, date of birth, ID band Patient awake    Reviewed: Allergy & Precautions, H&P , NPO status , Patient's Chart, lab work & pertinent test results  Airway       Dental   Pulmonary asthma ,          Cardiovascular hypertension, + Peripheral Vascular Disease     Neuro/Psych  Headaches,    GI/Hepatic GERD-  ,  Endo/Other  diabetes, Type 2, Oral Hypoglycemic Agents  Renal/GU      Musculoskeletal   Abdominal   Peds  Hematology  (+) anemia ,   Anesthesia Other Findings IBS  Reproductive/Obstetrics                           Anesthesia Physical Anesthesia Plan  ASA: II  Anesthesia Plan: General   Post-op Pain Management:    Induction: Intravenous  Airway Management Planned: Oral ETT  Additional Equipment:   Intra-op Plan:   Post-operative Plan: Extubation in OR and Possible Post-op intubation/ventilation  Informed Consent: I have reviewed the patients History and Physical, chart, labs and discussed the procedure including the risks, benefits and alternatives for the proposed anesthesia with the patient or authorized representative who has indicated his/her understanding and acceptance.     Plan Discussed with:   Anesthesia Plan Comments:         Anesthesia Quick Evaluation

## 2013-10-18 NOTE — Progress Notes (Signed)
Inpatient Diabetes Program Recommendations  AACE/ADA: New Consensus Statement on Inpatient Glycemic Control (2013)  Target Ranges:  Prepandial:   less than 140 mg/dL      Peak postprandial:   less than 180 mg/dL (1-2 hours)      Critically ill patients:  140 - 180 mg/dL   Reason for Assessment:  Results for KEARSTIN, LEARN (MRN [id number]) as of 10/18/2013 10:56  Ref. Range 10/17/2013 07:10 10/17/2013 11:44 10/17/2013 16:51 10/17/2013 20:51 10/18/2013 06:34  Glucose-Capillary Latest Range: 70-99 mg/dL 165 (H) 143 (H) 383 (H) 326 (H) 233 (H)   Diabetes history:  Type 2 diabetes  Outpatient Diabetes medications: Janumet XR [phone] mg q 24 hours Current orders for Inpatient glycemic control:  Lantus 20 units daily, Novolog 4 units tid with meals, Novolog moderate tid with meals and Decadron 4 mg bid  CBG's likely increased due to steroids.  Note plans for surgery.  Buchberger consider IV insulin/GlucoStabilizer during perioperative period to maintain CBG's 140-180 mg/dL.    Adah Perl, RN, BC-ADM Inpatient Diabetes Coordinator Pager [phone]

## 2013-10-18 NOTE — OR Nursing (Signed)
Report received over the phone from Dr Lyndon Code, Specimen showed Meningioma.  Report immediately relayed to Dr. Joya Salm during case.

## 2013-10-18 NOTE — Progress Notes (Signed)
[facility] PROGRESS NOTE  Haliegh Khurana Brisbon YTK:[id number] DOB: Kinnear 09, 1931 DOA: 10/09/2013 PCP: Georgetta Haber, MD  Brief summary   80 y.o. F w/ Hx anxiety, irritable bowel syndrome, HTN, CAD, asthma, HLD, Diabetes Type 2, Breast cancer (DCIS) status post right lumpectomy and postlumpectomy radiation therapy, and recurrent syncope, fall evaluated by Cardiology with plans for a loop recorder inserted at 12:00 the day of her admit, who fell around 9:30 a.m. and couldn't get up. She was found by family around 1:00 pm on the floor. EMS brought her to [facility] for further evaluation. The patient reported that she also fell 2 days prior, sustaining head trauma (right sided) and trauma to her back. She has had back pain and headache since the first fall.     Assessment/Plan:    1. Subdural hemorrhage  -SDH secondary to trauma, but CT showed an incidentally discovered right frontal lobe mass (see below)  -Continue Decadron, neurosurgery taking the patient for frontal lobe mass resection on 10/18/2013, discussed with neurosurgeon Dr. Joya Salm, patient will be transferred to neuro ICU under his care postop.    2. Right frontal lobe mass  -MRI confirms mass is c/w a meningioma, and suggests is causing 43mm shift to L  -Continue steroids, neurosurgery taking the patient for frontal lobe mass resection on 10/18/2013, discussed with neurosurgeon Dr. Joya Salm, patient will be transferred to neuro ICU under his care postop.    3. T 12 compression fracture  -Pain control; cont lidocaine patch, K pad, increased oxycontine 10 prn; monitor for side effects'  PT/OT: [place] [facility];Outpatient followup monitor.    4. DM w/o Complication Type II; FT7D-2.2;   -uncontrolled due to steroids; started lantus increased 20 units+as[part 4U +ISS: hold PO meds; monitor on ISS ,Once n.p.o. For surgery we'll place on low-dose D5 half-normal drip to avoid hypoglycemia, while in the OR please continue every hour Accu-Chek.  CBG (last  3)   Recent Labs  10/17/13 2051 10/18/13 0634 10/18/13 1204  GLUCAP 326* 233* 79       5. HTN; BP currently well controlled - follow trend, prn hydralazine       6. Syncope recurrent fall; monitor on tele'; echo (09/2013): LVEF normal; carotid doppler unremarkable (08/2011) -Outpatient loop recorder to be reconsidered post-op in outpt setting  -PT ot eval: [facility] PT    7. Anxiety; Continue Xanax. 0.25 mg QHS PRN -? Steroid side effect with worsening anxiety; taper steroids;  monitor     8. Hypo Na, ? Cerebral salt wasting; obtain, urine Na, osm;  -improved on IVF; IV NS as needed ;     9. H/o diarrhea, now constipation; abd exam benign; hold colestid; added bowel regimen  constipation resolved had BM on 6/14;     10. Probable seizure on 6/13;, started on keppra  -Neurology following, EEG noted, likely due to combination of subdural hemorrhage and brain mass, will defer further management and long-term anti-seizure medication management to neurology.     Code Status: full Family Communication: d/w patient, her brother  (indicate person spoken with, relationship, and if by phone, the number) Disposition Plan: pend clinical improvement, OR per neurosurgery  This week    Consultants:  Neurosurgery  Consultants:  Dr. Leeroy Cha (neurosurgery)     Procedure/Significant Events:    6/8 DG L-spine; New T12 wedge compression deformity with 50% loss of height anteriorly/some loss height of the posterior margin    6/8 CT head without contrast  -Acute, posttraumatic extra-axial hemorrhage along the anterior  right convexity, favor subdural hematoma and measuring up to 9 mm in thickness.  -Mass effect and vasogenic edema in anterior right frontal lobe which appears related to large inferior right frontal lobe mass. Favor large extra-axial meningioma,  - leftward midline shift of 6 mm.    6/9 MRI Brain  Acute right hemispheric subdural hematoma.  Meningioma in  the right frontal lobe along the roof of the right  orbit. This extends across the cribriform plate with mild extension  to the left of midline. There is vasogenic edema and a peritumoral  cyst above the mass. 7 mm midline shift to the left.     EEG -  Impression:  This awake and asleep EEG is abnormal due to the presence of:  1. Mild to moderate diffuse slowing of the waking background  2. Additional occasional focal slowing over the right frontocentrotemporal regions   Clinical Correlation of the above findings indicates bilateral cerebral dysfunction with additional focal cerebral dysfunction over the right frontocentral temporal region. Diffuse cerebral dysfunction is non-specific in etiology and can be seen with hypoxic/ischemic injury, toxic/metabolic encephalopathies, neurodegenerative disorders, or medication effect. Additional focal slowing over the right frontocentrotemporal region indicates focal cerebral dysfunction suggestive of underlying structural or physiologic abnormality. The absence of epileptiform discharges does not rule out a clinical diagnosis of epilepsy. Clinical correlation is advised.   Culture  6/8 MRSA by PCR negative  Antibiotics:  NA    HPI/Subjective: alert  Objective: Filed Vitals:   10/18/13 1015  BP: 140/62  Pulse: 58  Temp: 98 F (36.7 C)  Resp: 20    Intake/Output Summary (Last 24 hours) at 10/18/13 1228 Last data filed at 10/17/13 1700  Gross per 24 hour  Intake    360 ml  Output      0 ml  Net    360 ml   Filed Weights   10/09/13 2206  Weight: 61.6 kg (135 lb 12.9 oz)    Exam:   General:  alert  Cardiovascular: s1,s2 rrr  Respiratory: CTA BL  Abdomen: soft, nt, nd   Musculoskeletal: no LE edema  Data Reviewed: Basic Metabolic Panel:  Recent Labs Lab 10/12/13 0449 10/13/13 0632 10/14/13 1340  NA 131* 135* 132*  K 4.2 4.4 4.2  CL 95* 100 93*  CO2 21 22 22   GLUCOSE 289* 304* 222*  BUN 19 19 19   CREATININE  0.78 0.68 0.70  CALCIUM 8.4 8.2* 8.7   Liver Function Tests:  Recent Labs Lab 10/14/13 1340  AST 17  ALT 16  ALKPHOS 56  BILITOT 0.3  PROT 5.4*  ALBUMIN 2.7*   No results found for this basename: LIPASE, AMYLASE,  in the last 168 hours No results found for this basename: AMMONIA,  in the last 168 hours CBC:  Recent Labs Lab 10/12/13 0449 10/14/13 1340  WBC 10.1 9.1  HGB 10.3* 11.0*  HCT 30.4* 32.2*  MCV 87.6 86.8  PLT 252 267   Cardiac Enzymes: No results found for this basename: CKTOTAL, CKMB, CKMBINDEX, TROPONINI,  in the last 168 hours BNP (last 3 results) No results found for this basename: PROBNP,  in the last 8760 hours CBG:  Recent Labs Lab 10/17/13 1144 10/17/13 1651 10/17/13 2051 10/18/13 0634 10/18/13 1204  GLUCAP 143* 383* 326* 233* 79    Recent Results (from the past 240 hour(s))  MRSA PCR SCREENING     Status: None   Collection Time    10/09/13 11:16 PM  Result Value Ref Range Status   MRSA by PCR NEGATIVE  NEGATIVE Final   Comment:            The GeneXpert MRSA Assay (FDA     approved for NASAL specimens     only), is one component of a     comprehensive MRSA colonization     surveillance program. It is not     intended to diagnose MRSA     infection nor to guide or     monitor treatment for     MRSA infections.     Studies: No results found.  Scheduled Meds: . calcium carbonate  1 tablet Oral Q breakfast  . cholecalciferol  1,000 Units Oral Daily  . dexamethasone  4 mg Oral BID  . famotidine  20 mg Oral BID  . ferrous sulfate  325 mg Oral Q breakfast  . insulin aspart  0-15 Units Subcutaneous TID WC  . insulin aspart  4 Units Subcutaneous TID WC  . insulin glargine  20 Units Subcutaneous QHS  . levETIRAcetam  500 mg Intravenous Q12H  . lidocaine  1 patch Transdermal Q24H  . loratadine  10 mg Oral Daily  . omega-3 acid ethyl esters  1 g Oral Daily  . polyethylene glycol  17 g Oral Daily  . senna  1 tablet Oral Daily  .  trandolapril  2 mg Oral Daily  . verapamil  180 mg Oral Daily   Continuous Infusions:    Principal Problem:   Subdural hemorrhage Active Problems:   DM w/o Complication Type II   HYPERLIPIDEMIA   HYPERTENSION   GERD   Syncope   Carotid artery disease   Right frontal lobe mass   Anxiety   Compression fracture of T12 vertebra    Time spent: >35 minutes     [facility]  [facility] Pager [phone]. If 7PM-7AM, please contact night-coverage at [email or web address], password [facility] 10/18/2013, 12:28 PM  LOS: 9 days

## 2013-10-18 NOTE — Progress Notes (Signed)
PT Cancellation Note  Patient Details Name: [name] MRN: [id number] DOB: [birth year]   Cancelled Treatment:    Reason Eval/Treat Not Completed: Medical issues which prohibited therapy;Other (comment) (Pt went to OR for frontal lobe mass resection today), per notes it sounds like she will be in ICU post-op.  PT will need new orders to resume post-op.  PT to follow along to await new orders.  Thanks,    Barbarann Ehlers. [place], [place], DPT [phone]   10/18/2013, 4:07 PM

## 2013-10-18 NOTE — Progress Notes (Signed)
ANTIBIOTIC CONSULT NOTE - INITIAL  Pharmacy Consult for Vancomycin Indication: post-op prophylaxis  Allergies  Allergen Reactions  . Sular [Nisoldipine Er]     "severe headaches, swelling eyes, hands, feet, shortness of breath, weak, flushed face, brain boiling, fluid retention, high blood sugar, nervous, heart fast beating"  . Tegretol [Carbamazepine]     Blood poisoning   . Actonel [Risedronate Sodium]     unknown  . Amlodipine Besylate     unknown  . Aspirin     unknown  . Atacand [Candesartan]     unknown  . Bextra [Valdecoxib]     unknown  . Bisoprolol-Hydrochlorothiazide     uknown  . Bystolic [Nebivolol Hcl]     "extreme weakness, heaviness in legs & arms, swelling in legs/arms/face, swollen abdomen, pain in bladder, feet pain, soreness in chest"  . Candesartan Cilexetil     unknown  . Cefadroxil     unknown  . Cefdinir Swelling    Vaginal irritation, breathing,   . Cholestyramine     "itching rash on stomach, bloated, nausea, vomiting, sleeplessness, extreme pain in arms"  . Clonidine Other (See Comments)    Dry mouth, fluid retention  . Clonidine Hydrochloride Other (See Comments)    Dry mouth, fluid retention  . Codeine Nausea And Vomiting  . Ezetimibe Other (See Comments)    Made weak  . Hydrazine Yellow [Tartrazine]     "does not reduce high blood pressure, pain in arm, high pressure, felt like I was on verge of heart attack, really weak"  . Hydrocodone     unknown  . Hydrocodone-Acetaminophen     unknown  . Meloxicam     unknown  . Methylprednisolone Sodium Succinate     unknown  . Morphine Other (See Comments)    Feels morbid, weak, still in pain  . Nabumetone     unknown  . Naproxen Other (See Comments)    Shrinks bladder  . Niacin Other (See Comments)    Fast heart beat  . Niaspan [Niacin Er]     "fast heart beat, high blood pressure"  . Nisoldipine     unknown  . Norvasc [Amlodipine Besylate]     "extreme fluid retention/pain)  .  Optivar [Azelastine Hcl]     "sensitive to light"  . Penicillins     unknown  . Pseudoephedrine-Guaifenesin     unknown  . Risedronate Sodium     unknown  . Rofecoxib     unknown  . Ru-Tuss [Chlorphen-Pse-Atrop-Hyos-Scop]     unknown  . Sulfonamide Derivatives     unknown  . [place] [Sulfur]     unknown  . Telithromycin     unknown  . Telmisartan     "headache, difficulty urinating, high blood sugar, fluid retention"  . Terfenadine     unknown  . Trandolapril-Verapamil Hcl Er     Headache, difficulty urinating, high blood sugar, fluid retention  . Ziac [Bisoprolol-Hydrochlorothiazide]     "stopped urination"  . Celecoxib Rash    unknown  . Iodinated Diagnostic Agents Rash    "All over"  . Valium [Diazepam] Other (See Comments)    Makes her mean and hyper    Patient Measurements: Height: 5' 7.5" (171.5 cm) Weight: 135 lb 12.9 oz (61.6 kg) IBW/kg (Calculated) : 62.75 Adjusted Body Weight: n/a  Vital Signs: Temp: 98 F (36.7 C) (06/17 1915) Temp src: Oral (06/17 1252) BP: 123/50 mmHg (06/17 1915) Pulse Rate: 58 (06/17 1915) Intake/Output from previous day: 06/16  0701 - 06/17 0700 In: 620 [P.O.:620] Out: -  Intake/Output from this shift: Total I/O In: -  Out: 300 [Urine:250; Drains:50]  Labs: No results found for this basename: WBC, HGB, PLT, LABCREA, CREATININE,  in the last 72 hours Estimated Creatinine Clearance: 53.6 ml/min (by C-G formula based on Cr of 0.7). No results found for this basename: VANCOTROUGH, Corlis Leak, VANCORANDOM, [place], GENTPEAK, GENTRANDOM, TOBRATROUGH, TOBRAPEAK, TOBRARND, AMIKACINPEAK, AMIKACINTROU, AMIKACIN,  in the last 72 hours   Microbiology: Recent Results (from the past 720 hour(s))  MRSA PCR SCREENING     Status: None   Collection Time    10/09/13 11:16 PM      Result Value Ref Range Status   MRSA by PCR NEGATIVE  NEGATIVE Final   Comment:            The GeneXpert MRSA Assay (FDA     approved for NASAL specimens      only), is one component of a     comprehensive MRSA colonization     surveillance program. It is not     intended to diagnose MRSA     infection nor to guide or     monitor treatment for     MRSA infections.    Medical History: Past Medical History  Diagnosis Date  . Seasonal allergies   . HTN (hypertension)   . Osteoarthritis   . Palpitations   . Anemia   . History of colonic polyps   . GERD (gastroesophageal reflux disease)   . Hyperlipidemia   . Chronic facial pain   . Vitamin B12 deficiency   . Dyslipidemia   . Chronic foot pain   . Chronic cough   . Obesity   . Anxiety   . Skin cancer   . Complication of anesthesia     Sore jaw; could not chew or move mouth  . Diabetes mellitus     type 2 niddm x 20 years  . Melanoma   . Metatarsal bone fracture 2014  . Hammer toe     bilateral  . Cancer of right breast 12/26/12    right breast 12:00 o'clock, DCIS  . Hx of radiation therapy 03/07/13- 03/29/13    right breast 4250 cGy 17 sessions  . Ejection fraction     Medications:  Scheduled:  . calcium carbonate  1 tablet Oral Q breakfast  . cholecalciferol  1,000 Units Oral Daily  . [START ON 10/19/2013] dexamethasone  6 mg Intravenous 4 times per day   Followed by  . [START ON 10/20/2013] dexamethasone  4 mg Intravenous 4 times per day   Followed by  . [START ON 10/21/2013] dexamethasone  4 mg Intravenous 3 times per day  . dexamethasone  4 mg Oral BID  . famotidine  20 mg Oral BID  . ferrous sulfate  325 mg Oral Q breakfast  . insulin aspart  0-15 Units Subcutaneous TID WC  . insulin aspart  4 Units Subcutaneous TID WC  . insulin glargine  20 Units Subcutaneous QHS  . levETIRAcetam  500 mg Intravenous Q12H  . lidocaine  1 patch Transdermal Q24H  . loratadine  10 mg Oral Daily  . omega-3 acid ethyl esters  1 g Oral Daily  . pantoprazole (PROTONIX) IV  40 mg Intravenous QHS  . polyethylene glycol  17 g Oral Daily  . senna  1 tablet Oral Daily  . trandolapril  2  mg Oral Daily  . vancomycin      . [START  ON 10/19/2013] vancomycin  500 mg Intravenous Q12H  . verapamil  180 mg Oral Daily   Assessment: 80 yo female s/p craniotomy for tumor excision.  Pharmacy asked to dose vancomycin for 24 hrs post-op for surgical prophylaxis.  She received 1g dose of vancomycin in the OR at 1450 PM.  CrCl ~ 55 ml/min.  Goal of Therapy:  Prevention of surgical infection  Plan:  1. Vancomycin 500 mg IV q 12 hrs x 2 doses. 2. Pharmacy will sign-off.  Thank you!  Uvaldo Rising, BCPS  Clinical Pharmacist Pager [phone]  10/18/2013 7:58 PM

## 2013-10-19 LAB — BASIC METABOLIC PANEL
BUN: 19 mg/dL (ref 6–23)
CO2: 19 mEq/L (ref 19–32)
Calcium: 7.5 mg/dL — ABNORMAL LOW (ref 8.4–10.5)
Chloride: 95 mEq/L — ABNORMAL LOW (ref 96–112)
Creatinine, Ser: 0.66 mg/dL (ref 0.50–1.10)
GFR calc Af Amer: >90 mL/min (ref >90)
GFR calc non Af Amer: 81 mL/min — ABNORMAL LOW (ref >90)
Glucose, Bld: 362 mg/dL — ABNORMAL HIGH (ref 70–99)
Potassium: 4.5 mEq/L (ref 3.7–5.3)
Sodium: 130 mEq/L — ABNORMAL LOW (ref 137–147)

## 2013-10-19 LAB — GLUCOSE, CAPILLARY
Glucose-Capillary: 281 mg/dL — ABNORMAL HIGH (ref 70–99)
Glucose-Capillary: 267 mg/dL — ABNORMAL HIGH (ref 70–99)
Glucose-Capillary: 272 mg/dL — ABNORMAL HIGH (ref 70–99)
Glucose-Capillary: 288 mg/dL — ABNORMAL HIGH (ref 70–99)

## 2013-10-19 LAB — CBC
HCT: 27.2 % — ABNORMAL LOW (ref 36.0–46.0)
Hemoglobin: 9.1 g/dL — ABNORMAL LOW (ref 12.0–15.0)
MCH: 29.5 pg (ref 26.0–34.0)
MCHC: 33.5 g/dL (ref 30.0–36.0)
MCV: 88.3 fL (ref 78.0–100.0)
Platelets: 218 10*3/uL (ref 150–400)
RBC: 3.08 MIL/uL — ABNORMAL LOW (ref 3.87–5.11)
RDW: 14 % (ref 11.5–15.5)
WBC: 15.5 10*3/uL — ABNORMAL HIGH (ref 4.0–10.5)

## 2013-10-19 MED ORDER — LEVETIRACETAM 500 MG PO TABS
500.0000 mg | ORAL_TABLET | Freq: Two times a day (BID) | ORAL | Status: DC
  Administered 2013-10-19 – 2013-10-27 (×16): 500 mg via ORAL
  Filled 2013-10-19 (×22): qty 1

## 2013-10-19 MED ORDER — PANTOPRAZOLE SODIUM 40 MG PO TBEC
40.0000 mg | DELAYED_RELEASE_TABLET | Freq: Every day | ORAL | Status: DC
  Administered 2013-10-19 – 2013-10-26 (×8): 40 mg via ORAL
  Filled 2013-10-19 (×8): qty 1

## 2013-10-19 NOTE — Op Note (Signed)
[name], [name]                     ACCOUNT NO.:  [id number]  MEDICAL RECORD NO.:  [id number]  LOCATION:  3M11C                        FACILITY:  [facility]  PHYSICIAN:  Leeroy Cha, M.D.   DATE OF BIRTH:  1929-06-29  DATE OF PROCEDURE:  10/18/2013 DATE OF DISCHARGE:                              OPERATIVE REPORT   PREOPERATIVE DIAGNOSES:  Right frontal meningioma.  Small subdural hematoma, right side.  Trauma to the eye orbit.  POSTOPERATIVE DIAGNOSES:  Right frontal meningioma.  Small subdural hematoma, right side.  Trauma to the eye orbit.  PROCEDURE:  Right frontal craniotomy.  Total gross resection of the tumor.  Meningioma by frozen section.  Microscope.  SURGEON:  Leeroy Cha, M.D.  ASSISTANT:  Ashok Pall, M.D.  CLINICAL HISTORY:  [name] is a lady who fell at home, hit her head, and she was brought to the emergency room.  It was found that she had a traumatic injury to the right eye with subdural hematoma.  Upon evaluation of the CT and later on with the MRI, we found that she has a right frontal tumor with edema and shift, most likely meningioma.  She has a history of carcinoma of the breast.  Surgery was advised.  Her family knew the risk with the surgery including unable to remove the ___tumor_______, paralysis, infection.  PROCEDURE IN DETAIL:  The patient was taken to the OR and after intubation, the right side of the head was shaved and 3 pins were applied to the head.  She was positioned in a lateral manner with the right side up.  Then the right side of the temporal and frontoparietal area were cleaned with DuraPrep and drapes were applied.  An incision going from the right ear into an inch to the midline frontally was made. _raeny clipsthe falx_________ were applied to the edges.  The temporal bone was elevated. Drill hole was made and then connected with the craniotome.  The dura mater was opened.  Immediately hyperventilation plus IV mannitol was given.   The brain retractor were inserted into the right frontal area and dissection was carried out slowly.  Immediately, we came into the tumor.  The tumor was quite vascularized.  The specimen was sent to the laboratory that came back as a meningioma.  Then with the help of the microscope, we started dissecting the tumor away from the brain.  First in the midline and then in the lateral aspect, and at the end, at the base and _the falx_________ .  The tumor mostly attached to the orbit of the right side.  Total gross resection was achieved with coagulation of the final growth.  Having done this and having good resection, the area was irrigated.  Hemostasis was done with bipolar.  A drain was left in the subdural space.  The dura mater was replaced with DuraGen.  The bone flap was pulled back in place with plates and__________ screws, and the scalp was closed with Vicryl and nylon and staples.  The patient is going to go back to the ICU for at least overnight care.          ______________________________ Stann Mainland  Joya Salm, M.D.     EB/MEDQ  D:  10/18/2013  T:  10/19/2013  Job:  [id number]

## 2013-10-19 NOTE — Progress Notes (Signed)
PT Cancellation Note  Patient Details Name: Devetta Hagenow Wittwer MRN: [id number] DOB: 1929-10-20   Cancelled Treatment:    Reason Eval/Treat Not Completed: Fatigue/lethargy limiting ability to participate;Patient declined, no reason specified.  Pt requesting to return tomorrow.   Duncan Dull 10/19/2013, [place], [place] DPT  [phone]

## 2013-10-19 NOTE — Progress Notes (Signed)
Inpatient Diabetes Program Recommendations  AACE/ADA: New Consensus Statement on Inpatient Glycemic Control (2013)  Target Ranges:  Prepandial:   less than 140 mg/dL      Peak postprandial:   less than 180 mg/dL (1-2 hours)      Critically ill patients:  140 - 180 mg/dL     Results for [name], [name] (MRN [id number]) as of 10/19/2013 08:46  Ref. Range 10/18/2013 06:34 10/18/2013 12:04 10/18/2013 22:07  Glucose-Capillary Latest Range: 70-99 mg/dL 233 (H) 79 338 (H)    Results for [name], [name] (MRN [id number]) as of 10/19/2013 08:46  Ref. Range 10/19/2013 08:15  Glucose-Capillary Latest Range: 70-99 mg/dL 281 (H)     Noted Decadron increased to 6 mg QID.  Patient POD #1 Craniotomy.  CBGs significantly elevated.   MD- Please consider increasing Lantus insulin to 25 units QHS   Will follow Wyn Quaker RN, MSN, CDE Diabetes Coordinator Inpatient Diabetes Program Team Pager: [phone] (8a-10p)

## 2013-10-19 NOTE — Progress Notes (Signed)
Patient doing well after surgery.  No further seizures.  Would recommend continue Keppra 500 mg BID.  Neurosurgery to follow.   No further neurologic intervention is recommended at this time.  If further questions arise, please call or page at that time.  Thank you for allowing neurology to participate in the care of this patient.  Etta Quill PA-C [facility] [phone]  10/19/2013, 11:00 AM

## 2013-10-19 NOTE — Progress Notes (Signed)
Patient ID: [name], female   DOB: Apr 15, 1930, 80 y.o.   MRN: [id number] Doing well. No deficits. C/o incisional pain. See orders

## 2013-10-20 LAB — POCT I-STAT 7, (LYTES, BLD GAS, ICA,H+H)
Bicarbonate: 24.9 mEq/L — ABNORMAL HIGH (ref 20.0–24.0)
Calcium, Ion: 1.11 mmol/L — ABNORMAL LOW (ref 1.13–1.30)
HCT: 27 % — ABNORMAL LOW (ref 36.0–46.0)
Hemoglobin: 9.2 g/dL — ABNORMAL LOW (ref 12.0–15.0)
O2 Saturation: 100 %
Patient temperature: 36
Potassium: 4 mEq/L (ref 3.7–5.3)
Sodium: 130 mEq/L — ABNORMAL LOW (ref 137–147)
TCO2: 26 mmol/L (ref 0–100)
pCO2 arterial: 38.9 mmHg (ref 35.0–45.0)
pH, Arterial: 7.41 (ref 7.350–7.450)
pO2, Arterial: 346 mmHg — ABNORMAL HIGH (ref 80.0–100.0)

## 2013-10-20 LAB — GLUCOSE, CAPILLARY
Glucose-Capillary: 268 mg/dL — ABNORMAL HIGH (ref 70–99)
Glucose-Capillary: 235 mg/dL — ABNORMAL HIGH (ref 70–99)
Glucose-Capillary: 216 mg/dL — ABNORMAL HIGH (ref 70–99)

## 2013-10-20 MED ORDER — DEXAMETHASONE 4 MG PO TABS
4.0000 mg | ORAL_TABLET | Freq: Three times a day (TID) | ORAL | Status: DC
  Administered 2013-10-21 – 2013-10-23 (×7): 4 mg via ORAL
  Filled 2013-10-20 (×12): qty 1

## 2013-10-20 MED ORDER — DEXAMETHASONE 4 MG PO TABS
4.0000 mg | ORAL_TABLET | Freq: Four times a day (QID) | ORAL | Status: AC
  Administered 2013-10-20 (×3): 4 mg via ORAL
  Filled 2013-10-20: qty 1

## 2013-10-20 NOTE — Progress Notes (Signed)
Inpatient Diabetes Program Recommendations  AACE/ADA: New Consensus Statement on Inpatient Glycemic Control (2013)  Target Ranges:  Prepandial:   less than 140 mg/dL      Peak postprandial:   less than 180 mg/dL (1-2 hours)      Critically ill patients:  140 - 180 mg/dL    Results for [name], [name] (MRN [id number]) as of 10/20/2013 11:05  Ref. Range 10/19/2013 08:15 10/19/2013 12:04 10/19/2013 17:13 10/19/2013 22:04  Glucose-Capillary Latest Range: 70-99 mg/dL 281 (H) 267 (H) 272 (H) 288 (H)    Results for [name], [name] (MRN [id number]) as of 10/20/2013 11:05  Ref. Range 10/20/2013 08:18  Glucose-Capillary Latest Range: 70-99 mg/dL 268 (H)      CBGs significantly elevated. Note that patient was getting both PO Decadron and IV Decadron.  RN called MD and had Decadron order changed to Decadron 4 mg PO QID.   MD- Please consider the following:  1) Increase Lantus insulin to 25 units QHS 2) Increase Novolog Meal Coverage to 6 units tid with meals    Will follow Wyn Quaker RN, MSN, CDE Diabetes Coordinator Inpatient Diabetes Program Team Pager: [phone] (8a-10p)

## 2013-10-20 NOTE — Progress Notes (Signed)
Patient ID: [name], female   DOB: [birth year], 80 y.o.   MRN: [id number] Doing freat. Ambulating with help. No pain. Plan to remove drain in am

## 2013-10-20 NOTE — Evaluation (Signed)
Physical Therapy Evaluation Patient Details Name: [name] MRN: [id number] DOB: 06-Jul-1929 Today's Date: 10/20/2013   History of Present Illness  80 y.o. female admitted to [facility] on 10/09/13 with recurrent falls.  Found to have new T12 compression fx.  MRI revealed Acute right hemispheric subdural hematoma, and Meningioma in the right frontal lobe.  Neurosurgery consulted and planned brain surgery.  Clinical Impression  Patient seen and re-evaluated for mobility post tumor resection.  Patient complains of blurry vision right eye and increased headache and pressure. Balance deficits evident upon re-evaluation despite use of assistive device. Patient with 3 noted LOB and staggering in room, provided patient with RW to use for ambulation and patient ran into objects x4. Nsg made aware. Patient also complaining of some chest congestion. At this time recommend continued skilled PT in addition to supervision 24/7 and HHPT upon discharge.    Follow Up Recommendations Home health PT;Supervision/Assistance - 24 hour    Equipment Recommendations  None recommended by PT    Recommendations for Other Services       Precautions / Restrictions Precautions Precautions: Fall Precaution Comments: h/o multiple falls Restrictions Weight Bearing Restrictions: No      Mobility  Bed Mobility Overal bed mobility: Modified Independent             General bed mobility comments: Reinforced best technique for bed mobility  Transfers Overall transfer level: Needs assistance Equipment used: None Transfers: Sit to/from Stand Sit to Stand: Min assist         General transfer comment: mild unsteady on initial stand  Ambulation/Gait Ambulation/Gait assistance: Min assist Ambulation Distance (Feet): 340 Feet Assistive device: Rolling walker (2 wheeled) Gait Pattern/deviations: Step-through pattern;Decreased stride length;Drifts right/left;Trunk flexed Gait velocity: prefers slower, but can increase  speed when cued Gait velocity interpretation: Below normal speed for age/gender General Gait Details: staggering gait with some scissoring and drifting. Multipoe times ran into objects with RW, VCs to direct to tasks and for control and positioning within RW.   Stairs            Wheelchair Mobility    Modified Rankin (Stroke Patients Only)       Balance Overall balance assessment: Needs assistance Sitting-balance support: Feet supported Sitting balance-Leahy Scale: Good       Standing balance-Leahy Scale: Fair                               Pertinent Vitals/Pain VSS.    Home Living Family/patient expects to be discharged to:: Private residence Living Arrangements: Alone Available Help at Discharge: Available PRN/intermittently;Family Type of Home: House Home Access: Stairs to enter Entrance Stairs-Rails: Psychiatric nurse of Steps: 4 Home Layout: One level Home Equipment: Environmental consultant - 2 wheels;Shower seat - built in      Prior Function Level of Independence: Independent               Hand Dominance   Dominant Hand: Right    Extremity/Trunk Assessment                      Cervical / Trunk Assessment: Normal  Communication   Communication: No difficulties  Cognition Arousal/Alertness: Awake/alert Behavior During Therapy: WFL for tasks assessed/performed Overall Cognitive Status: Impaired/Different from baseline Area of Impairment: Safety/judgement;Awareness;Problem solving         Safety/Judgement: Decreased awareness of safety Awareness: Emergent Problem Solving: Requires verbal cues;Requires tactile cues  General Comments      Exercises Other Exercises Other Exercises: Pt complains of blurry vision right eye and increased headache?pressure       Assessment/Plan    PT Assessment Patient needs continued PT services  PT Diagnosis Generalized weakness   PT Problem List Decreased  strength;Decreased activity tolerance;Decreased mobility  PT Treatment Interventions Gait training;Functional mobility training;Therapeutic activities;Patient/family education   PT Goals (Current goals can be found in the Care Plan section) Acute Rehab PT Goals Patient Stated Goal: to go home after all of this PT Goal Formulation: With patient Time For Goal Achievement: 10/24/13 Potential to Achieve Goals: Good    Frequency Min 3X/week   Barriers to discharge Decreased caregiver support      Co-evaluation               End of Session   Activity Tolerance: Patient limited by pain Patient left: in chair;with call bell/phone within reach Nurse Communication: Mobility status         Time: 9417-4081 PT Time Calculation (min): 26 min   Charges:   PT Evaluation $PT Re-evaluation: 1 Procedure PT Treatments $Gait Training: 8-22 mins $Therapeutic Activity: 8-22 mins   PT G CodesDuncan Dull 10/20/2013, 5:02 PM Alben Deeds, [place] DPT  [phone]

## 2013-10-21 LAB — GLUCOSE, CAPILLARY
Glucose-Capillary: 330 mg/dL — ABNORMAL HIGH (ref 70–99)
Glucose-Capillary: 279 mg/dL — ABNORMAL HIGH (ref 70–99)
Glucose-Capillary: 325 mg/dL — ABNORMAL HIGH (ref 70–99)
Glucose-Capillary: 248 mg/dL — ABNORMAL HIGH (ref 70–99)

## 2013-10-21 NOTE — Progress Notes (Signed)
Pt seen and examined. No issues overnight. Pt reports improvement in the blurry vision she was experiencing yesterday afternoon in the right eye.  EXAM: Temp:  [97.3 F (36.3 C)-98 F (36.7 C)] 97.3 F (36.3 C) (06/20 0827) Pulse Rate:  [57-99] 60 (06/20 0700) Resp:  [12-24] 18 (06/20 0700) BP: (107-155)/(53-128) 117/90 mmHg (06/20 0700) SpO2:  [93 %-100 %] 96 % (06/20 0700) Intake/Output     06/19 0701 - 06/20 0700 06/20 0701 - 06/21 0700   P.O. 1560    I.V. (mL/kg) 900 (14.6)    IV Piggyback     Total Intake(mL/kg) 2460 (39.9)    Urine (mL/kg/hr) 5100 (3.4)    Drains 120 (0.1)    Total Output 5220     Net -2760          Stool Occurrence 1 x     Awake, alert, oriented Speech fluent CN grossly intact, ?RAPD OD Moving all ext well  LABS: Lab Results  Component Value Date   CREATININE 0.66 10/19/2013   BUN 19 10/19/2013   NA 130* 10/19/2013   K 4.5 10/19/2013   CL 95* 10/19/2013   CO2 19 10/19/2013   Lab Results  Component Value Date   WBC 15.5* 10/19/2013   HGB 9.1* 10/19/2013   HCT 27.2* 10/19/2013   MCV 88.3 10/19/2013   PLT 218 10/19/2013    IMPRESSION: - 80 y.o. female s/p right frontal crani for meningioma/small SDH - Neurologically stable  PLAN: - JP d/c'ed - Transfer to floor - Cont steroid taper

## 2013-10-21 NOTE — Progress Notes (Signed)
Patient was transferred from the unit. When patient came to the floor and into bed blood was noticed coming from patient head. I reinforced patient bandage and contacted Dr. No new orders given just continue to monitor.

## 2013-10-22 ENCOUNTER — Encounter (HOSPITAL_COMMUNITY): Payer: Self-pay | Admitting: Neurosurgery

## 2013-10-22 LAB — GLUCOSE, CAPILLARY
Glucose-Capillary: 177 mg/dL — ABNORMAL HIGH (ref 70–99)
Glucose-Capillary: 211 mg/dL — ABNORMAL HIGH (ref 70–99)
Glucose-Capillary: 486 mg/dL — ABNORMAL HIGH (ref 70–99)
Glucose-Capillary: 351 mg/dL — ABNORMAL HIGH (ref 70–99)
Glucose-Capillary: 149 mg/dL — ABNORMAL HIGH (ref 70–99)

## 2013-10-22 MED ORDER — INSULIN GLARGINE 100 UNIT/ML ~~LOC~~ SOLN
35.0000 [IU] | Freq: Every day | SUBCUTANEOUS | Status: DC
  Administered 2013-10-22 – 2013-10-26 (×5): 35 [IU] via SUBCUTANEOUS
  Filled 2013-10-22 (×6): qty 0.35

## 2013-10-22 NOTE — Progress Notes (Signed)
No issues overnight. Pt had some drainage from wound, reinforced. No further drainage. No complaints this am.  EXAM:  BP 132/74  Pulse 81  Temp(Src) 98.6 F (37 C) (Oral)  Resp 18  Ht 5' 7.5" (1.715 m)  Wt 61.6 kg (135 lb 12.9 oz)  BMI 20.94 kg/m2  SpO2 94%  Awake, alert, oriented  Speech fluent, appropriate  CN grossly intact  5/5 BUE/BLE   IMPRESSION:  80 y.o. female POD# 3 s/p crani for meningioma - Neurologically stable  PLAN: - Cont mobilization with PT/OT - Keppra - Continue decadron taper

## 2013-10-22 NOTE — Progress Notes (Signed)
Dr. Kathyrn Sheriff made aware of pt's CBG of 486. New orders received.

## 2013-10-23 ENCOUNTER — Ambulatory Visit: Payer: Medicare Other | Admitting: Internal Medicine

## 2013-10-23 LAB — GLUCOSE, CAPILLARY
Glucose-Capillary: 115 mg/dL — ABNORMAL HIGH (ref 70–99)
Glucose-Capillary: 380 mg/dL — ABNORMAL HIGH (ref 70–99)
Glucose-Capillary: 412 mg/dL — ABNORMAL HIGH (ref 70–99)
Glucose-Capillary: 305 mg/dL — ABNORMAL HIGH (ref 70–99)
Glucose-Capillary: 163 mg/dL — ABNORMAL HIGH (ref 70–99)

## 2013-10-23 MED ORDER — DEXAMETHASONE 2 MG PO TABS
2.0000 mg | ORAL_TABLET | Freq: Three times a day (TID) | ORAL | Status: DC
  Administered 2013-10-23 – 2013-10-27 (×12): 2 mg via ORAL
  Filled 2013-10-23 (×17): qty 1

## 2013-10-23 NOTE — Progress Notes (Signed)
UR COMPLETED  

## 2013-10-23 NOTE — Progress Notes (Signed)
Patient ID: [name], female   DOB: 1930/01/14, 80 y.o.   MRN: [id number] Doing well. Drain out. No final path report .

## 2013-10-23 NOTE — Progress Notes (Signed)
Physical Therapy Treatment Patient Details Name: [name] MRN: [id number] DOB: [birth year] Today's Date: 10/23/2013    History of Present Illness 80 y.o. female admitted to [facility] on 10/09/13 with recurrent falls.  Found to have new T12 compression fx.  MRI revealed Acute right hemispheric subdural hematoma, and Meningioma in the right frontal lobe.  Neurosurgery consulted and planned brain surgery.    PT Comments    Making gains with gait and balance, noted pt did not run RW into objects this session as she had last session; Would like to verify that pt has 24 hour assist available to her at dc home  Follow Up Recommendations  Home health PT;Supervision/Assistance - 24 hour     Equipment Recommendations  None recommended by PT    Recommendations for Other Services OT consult     Precautions / Restrictions Precautions Precautions: Fall Precaution Comments: h/o multiple falls    Mobility  Bed Mobility Overal bed mobility: Modified Independent             General bed mobility comments: Reinforced best technique for bed mobility  Transfers Overall transfer level: Needs assistance Equipment used: Rolling walker (2 wheeled) Transfers: Sit to/from Stand Sit to Stand: Min guard         General transfer comment: mild unsteady on initial stand  Ambulation/Gait Ambulation/Gait assistance: Min guard Ambulation Distance (Feet): 340 Feet Assistive device: Rolling walker (2 wheeled) Gait Pattern/deviations: Step-through pattern (erratic step-width with occasional scissor)     General Gait Details: While pt did have tow noted steps with "catching" on her stance LE, no overt loss of balance noted and pt did not run inot objects with her RW   Stairs            Wheelchair Mobility    Modified Rankin (Stroke Patients Only)       Balance     Sitting balance-Leahy Scale: Good       Standing balance-Leahy Scale: Fair                      Cognition  Arousal/Alertness: Awake/alert Behavior During Therapy: WFL for tasks assessed/performed Overall Cognitive Status: Within Functional Limits for tasks assessed                      Exercises      General Comments        Pertinent Vitals/Pain Back pain from her compression fx Laid down on Kpad (for warm water circulation)    Home Living                      Prior Function            PT Goals (current goals can now be found in the care plan section) Acute Rehab PT Goals Patient Stated Goal: to go home after all of this PT Goal Formulation: With patient Time For Goal Achievement: 10/24/13 Potential to Achieve Goals: Good Progress towards PT goals: Progressing toward goals    Frequency  Min 3X/week    PT Plan Current plan remains appropriate    Co-evaluation             End of Session Equipment Utilized During Treatment: Gait belt Activity Tolerance: Patient tolerated treatment well Patient left: in bed;with call bell/phone within reach     Time: 1358-1425 PT Time Calculation (min): 27 min  Charges:  $Gait Training: 23-37 mins  G Codes:      Roney Marion Hamff 10/23/2013, 3:24 PM  Roney Marion, [place]  Acute Rehabilitation Services Pager [phone] Office [phone]

## 2013-10-24 LAB — GLUCOSE, CAPILLARY
Glucose-Capillary: 188 mg/dL — ABNORMAL HIGH (ref 70–99)
Glucose-Capillary: 298 mg/dL — ABNORMAL HIGH (ref 70–99)
Glucose-Capillary: 348 mg/dL — ABNORMAL HIGH (ref 70–99)
Glucose-Capillary: 320 mg/dL — ABNORMAL HIGH (ref 70–99)
Glucose-Capillary: 124 mg/dL — ABNORMAL HIGH (ref 70–99)

## 2013-10-24 NOTE — Progress Notes (Signed)
Patient ID: [name], female   DOB: 1929/06/05, 80 y.o.   MRN: [id number] Doing well, wound dry. Rehabilitation to see her. Lives alone

## 2013-10-24 NOTE — Progress Notes (Signed)
Pt not in need of inpt rehab admission at her current functional level with therapy . Ambulated 340 feet minguard assist level. Rec [place]. [id number]

## 2013-10-25 LAB — GLUCOSE, CAPILLARY
Glucose-Capillary: 84 mg/dL (ref 70–99)
Glucose-Capillary: 74 mg/dL (ref 70–99)
Glucose-Capillary: 211 mg/dL — ABNORMAL HIGH (ref 70–99)
Glucose-Capillary: 208 mg/dL — ABNORMAL HIGH (ref 70–99)
Glucose-Capillary: 180 mg/dL — ABNORMAL HIGH (ref 70–99)

## 2013-10-25 NOTE — Progress Notes (Signed)
Physical Therapy Treatment Patient Details Name: [name] MRN: [id number] DOB: August 23, 1929 Today's Date: 10/25/2013    History of Present Illness 80 y.o. female admitted to [facility] on 10/09/13 with recurrent falls.  Found to have new T12 compression fx.  MRI revealed Acute right hemispheric subdural hematoma, and Meningioma in the right frontal lobe.  Neurosurgery consulted and planned brain surgery.    PT Comments    Pt progressing with mobility. Pt requires max encouragement to participate and speaks negatively throughout session regarding her progress. Due to pt not being a CIR candidate, discussed D/C options with pt. Pt refusing SNF at this time and becomes agitated when SNF is mentioned. Pt will benefit from HHPT and use of RW upon acute D/C. Pt stated she [place] could have a family member help her out as needed. Will cont to follow per POC.   Follow Up Recommendations  Home health PT;Supervision/Assistance - 24 hour (if pt is denied SNF)     Equipment Recommendations  None recommended by PT    Recommendations for Other Services OT consult     Precautions / Restrictions Precautions Precautions: Fall Precaution Comments: h/o multiple falls Restrictions Weight Bearing Restrictions: No    Mobility  Bed Mobility Overal bed mobility: Modified Independent             General bed mobility comments: pt use of handrails and HOB was elevated  Transfers Overall transfer level: Needs assistance Equipment used: Rolling walker (2 wheeled) Transfers: Sit to/from Stand Sit to Stand: Supervision         General transfer comment: supervision for safety and min cues for proper hand placement ; pt was able to transfer sit to stand from mat in gym without armrests   Ambulation/Gait Ambulation/Gait assistance: Supervision Ambulation Distance (Feet): 400 Feet (200 x 2 ) Assistive device: Rolling walker (2 wheeled) Gait Pattern/deviations: Step-through pattern;Decreased stride  length;Narrow base of support;Trunk flexed Gait velocity: can incr with max cues  Gait velocity interpretation: Below normal speed for age/gender General Gait Details: no LOB noted with ambulation; cues for upright posture and to look foward vs at ground when ambulating; pt required sitting rest break due to fatigue; performed head turns and was able to manage RW with head turns   Stairs Stairs:  (did not want to practice steps today)          Wheelchair Mobility    Modified Rankin (Stroke Patients Only)       Balance Overall balance assessment: History of Falls;No apparent balance deficits (not formally assessed)         Standing balance support: During functional activity;No upper extremity supported Standing balance-Leahy Scale: Fair Standing balance comment: was able to stand without UE and weighshift minmally                     Cognition Arousal/Alertness: Awake/alert Behavior During Therapy: Flat affect Overall Cognitive Status: Within Functional Limits for tasks assessed                      Exercises General Exercises - Lower Extremity Ankle Circles/Pumps: AROM;Both;10 reps;Supine Heel Slides: AROM;Both;5 reps;Supine;Strengthening    General Comments General comments (skin integrity, edema, etc.): at length discussion regarding rehab progress and D/C recommedations pt very concerned and reports she cannot D/C home due to not having support; however, she is also refusing SNF; will benefit from CM and CSW to consult       Pertinent Vitals/Pain C/o chronic  back pain    Home Living                      Prior Function            PT Goals (current goals can now be found in the care plan section) Acute Rehab PT Goals Patient Stated Goal: to get rehab then go home PT Goal Formulation: With patient Time For Goal Achievement: 10/28/13 Potential to Achieve Goals: Good Progress towards PT goals: Progressing toward goals     Frequency  Min 3X/week    PT Plan Current plan remains appropriate    Co-evaluation             End of Session Equipment Utilized During Treatment: Gait belt Activity Tolerance: Patient tolerated treatment well Patient left: in bed;with call bell/phone within reach;with family/visitor present     Time: 3716-9678 PT Time Calculation (min): 26 min  Charges:  [employer] Training: 23-37 mins                    G CodesElie Confer [place], [place]  [phone] 10/25/2013, 1:19 PM
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
## 2013-10-25 NOTE — Progress Notes (Signed)
Pt very discouraged about the looks of her hair, asked pt if she wanted this RN to shave the rest of it, so it can start growing back all at once. Pt requested hair to be shaved. Pt thankful for this RN offering, states she feels better. Will continue to monitor.

## 2013-10-25 NOTE — Progress Notes (Signed)
Talked to patient about DCP; received call from [facility] with inpt rehab, patient is doing too well and she does not qualify to go to inpatient rehab and recommends home with [facility]; CM talked to patient about [facility] services with family members present. Patient stated " how are they going to get in the house, I have an alarm system" ...Marland KitchenMarland KitchenCM offered to patient a service to assist patient if she fell and could not get up/ lifeline system Patient stated that she has had that and does not want it again because it does not work. CM offered to have the Soc Worker stop by to see if she qualified for SNF placement...Marland KitchenMarland KitchenPatient stated " Hell No." Sister present in room stated that this is what she does all day, just lie in the bed and does not want to get up until 12 or 1pm.   Options CM can arrange [facility] services at discharge, meals on wheels,  Lifeline system. Patient does not qualify for Inpt rehab per Pamala Hurry with [place] rehab Patient is refusing SNF placement  Attending MD, please help with DCP; B Pennie Rushing [phone]

## 2013-10-25 NOTE — Progress Notes (Signed)
Received call from [facility], she contacted [employer] for SNF placement, they could not accept patient because she is doing too well; Only option at this point is home with therapy if the patient will allow CM to make arrangements; Aneta Mins [phone]

## 2013-10-25 NOTE — Clinical Social Work Note (Signed)
CSW consulted by [facility] regarding possible SNF placement at [facility]. CSW consulted with [facility] admissions coordinator. Per [facility] coordinator, pt will not qualify for SNF placement due to pt's progressed ambulation's. CSW updated RNCM regarding information above. CSW signing off.  Lubertha Sayres, MSW, [facility] Licensed Clinical Social Worker [phone] and [phone]

## 2013-10-26 LAB — GLUCOSE, CAPILLARY
Glucose-Capillary: 60 mg/dL — ABNORMAL LOW (ref 70–99)
Glucose-Capillary: 327 mg/dL — ABNORMAL HIGH (ref 70–99)
Glucose-Capillary: 244 mg/dL — ABNORMAL HIGH (ref 70–99)
Glucose-Capillary: 246 mg/dL — ABNORMAL HIGH (ref 70–99)

## 2013-10-26 NOTE — Progress Notes (Signed)
Physical Therapy Treatment Patient Details Name: [name] MRN: [id number] DOB: August 12, 1929 Today's Date: 10/26/2013    History of Present Illness 80 y.o. female admitted to [facility] on 10/09/13 with recurrent falls.  Found to have new T12 compression fx.  MRI revealed Acute right hemispheric subdural hematoma, and Meningioma in the right frontal lobe.  Neurosurgery consulted and planned brain surgery.    PT Comments    Pt continues to require max encouragement to participate in therapy. Is at supervision level for mobility at this time. Will benefit from additional therapy upon acute stay for high level balance activities. Pt reports she will have 24/7 (A) if D/C on Monday. Max encouragement to mobilize 2-3x daily with nursing as tolerated, pt stated "im not going to walk, i dont care what you or anyone else says, it makes me feel terrible."  Cont to follow up with pt per POC.   Follow Up Recommendations  Home health PT;Supervision/Assistance - 24 hour (reports she will have (A) on monday)     Equipment Recommendations  None recommended by PT    Recommendations for Other Services OT consult     Precautions / Restrictions Precautions Precautions: Fall Precaution Comments: h/o multiple falls Restrictions Weight Bearing Restrictions: No    Mobility  Bed Mobility Overal bed mobility: Modified Independent             General bed mobility comments: incr time and use of handrails with HOB flattened  Transfers Overall transfer level: Needs assistance Equipment used: Rolling walker (2 wheeled) Transfers: Sit to/from Stand Sit to Stand: Supervision         General transfer comment: supervision for min cues for safety and hand placement with RW   Ambulation/Gait Ambulation/Gait assistance: Supervision Ambulation Distance (Feet): 300 Feet Assistive device: Rolling walker (2 wheeled) Gait Pattern/deviations: Step-through pattern;Decreased stride length;Narrow base of support;Trunk  flexed Gait velocity: can incr with max cues    General Gait Details: pt able to ambulate while navigating objects; no LOB noted with mobility; supervision for safety    Stairs            Wheelchair Mobility    Modified Rankin (Stroke Patients Only)       Balance Overall balance assessment: History of Falls Sitting-balance support: Feet supported;No upper extremity supported Sitting balance-Leahy Scale: Normal Sitting balance - Comments: sat EOB; denied dizziness    Standing balance support: During functional activity;Bilateral upper extremity supported Standing balance-Leahy Scale: Fair Standing balance comment: able to stand minimal amount of time without UE support and weightshift; no LOB noted             High level balance activites: Head turns;Direction changes;Turns High Level Balance Comments: no LOB noted when negotiating obstacles or with direction changes and head turns     Cognition Arousal/Alertness: Awake/alert Behavior During Therapy: Flat affect Overall Cognitive Status: Within Functional Limits for tasks assessed                      Exercises General Exercises - Lower Extremity Ankle Circles/Pumps: AROM;Both;10 reps;Supine    General Comments General comments (skin integrity, edema, etc.): pt continues to be reluctant to participate in therapy; however, then states 'im not getting the therapy i need to go home"; max encouragement to ambulate hallway as tolerated with nursing      Pertinent Vitals/Pain C/o chronic back pain; no new c/o pain during session     Home Living  Prior Function            PT Goals (current goals can now be found in the care plan section) Acute Rehab PT Goals Patient Stated Goal: go home monday PT Goal Formulation: With patient Time For Goal Achievement: 10/28/13 Potential to Achieve Goals: Good Progress towards PT goals: Progressing toward goals    Frequency  Min  3X/week    PT Plan Current plan remains appropriate    Co-evaluation             End of Session Equipment Utilized During Treatment: Gait belt Activity Tolerance: Patient tolerated treatment well Patient left: in chair;with call bell/phone within reach;with family/visitor present     Time: 6979-4801 PT Time Calculation (min): 16 min  Charges:  $Gait Training: 8-22 mins                    G CodesMelina Modena [place] , [place]  [phone]  10/26/2013, 2:47 PM

## 2013-10-26 NOTE — Progress Notes (Signed)
10/26/13- Results for Cerullo, Patriciaann S (MRN [id number]) as of 10/26/2013 11:47  Ref. Range 10/26/2013 06:39  Glucose-Capillary Latest Range: 70-99 mg/dL 60 (L)   Note low CBG this AM. Please consider decrease of Lantus insulin to 25 units q HS.   Thanks, Adah Perl, RN, BC-ADM Inpatient Diabetes Coordinator Pager [phone]
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
## 2013-10-26 NOTE — Progress Notes (Signed)
Doing well. Lives alone. Can not go to SNF or rehabilitation. Wound dry

## 2013-10-26 NOTE — Progress Notes (Signed)
Patient has asked several staff members about her [name]; CM talked to patient and instructed her to have someone go to her home and get the LTC Policy and bring them to the hospital, if not her PCP can fill out the paper work after discharge for additional help at home; The hospital only have Medicare and Tricare policy as insurance payer; Mindi Slicker RN,BSN,MHA [phone]

## 2013-10-27 DIAGNOSIS — S066XAA Traumatic subarachnoid hemorrhage with loss of consciousness status unknown, initial encounter: Secondary | ICD-10-CM | POA: Diagnosis not present

## 2013-10-27 DIAGNOSIS — S22009A Unspecified fracture of unspecified thoracic vertebra, initial encounter for closed fracture: Secondary | ICD-10-CM | POA: Diagnosis not present

## 2013-10-27 DIAGNOSIS — S066X9A Traumatic subarachnoid hemorrhage with loss of consciousness of unspecified duration, initial encounter: Secondary | ICD-10-CM | POA: Diagnosis not present

## 2013-10-27 DIAGNOSIS — I6200 Nontraumatic subdural hemorrhage, unspecified: Secondary | ICD-10-CM | POA: Diagnosis not present

## 2013-10-27 DIAGNOSIS — R58 Hemorrhage, not elsewhere classified: Secondary | ICD-10-CM | POA: Diagnosis not present

## 2013-10-27 DIAGNOSIS — I10 Essential (primary) hypertension: Secondary | ICD-10-CM | POA: Diagnosis not present

## 2013-10-27 DIAGNOSIS — K219 Gastro-esophageal reflux disease without esophagitis: Secondary | ICD-10-CM | POA: Diagnosis not present

## 2013-10-27 DIAGNOSIS — I2510 Atherosclerotic heart disease of native coronary artery without angina pectoris: Secondary | ICD-10-CM | POA: Diagnosis not present

## 2013-10-27 DIAGNOSIS — R55 Syncope and collapse: Secondary | ICD-10-CM | POA: Diagnosis not present

## 2013-10-27 DIAGNOSIS — M8448XA Pathological fracture, other site, initial encounter for fracture: Secondary | ICD-10-CM | POA: Diagnosis not present

## 2013-10-27 DIAGNOSIS — M6281 Muscle weakness (generalized): Secondary | ICD-10-CM | POA: Diagnosis not present

## 2013-10-27 DIAGNOSIS — E785 Hyperlipidemia, unspecified: Secondary | ICD-10-CM | POA: Diagnosis not present

## 2013-10-27 DIAGNOSIS — M159 Polyosteoarthritis, unspecified: Secondary | ICD-10-CM | POA: Diagnosis not present

## 2013-10-27 DIAGNOSIS — F411 Generalized anxiety disorder: Secondary | ICD-10-CM | POA: Diagnosis not present

## 2013-10-27 DIAGNOSIS — D320 Benign neoplasm of cerebral meninges: Secondary | ICD-10-CM | POA: Diagnosis not present

## 2013-10-27 DIAGNOSIS — C50919 Malignant neoplasm of unspecified site of unspecified female breast: Secondary | ICD-10-CM | POA: Diagnosis not present

## 2013-10-27 DIAGNOSIS — E119 Type 2 diabetes mellitus without complications: Secondary | ICD-10-CM | POA: Diagnosis not present

## 2013-10-27 DIAGNOSIS — R262 Difficulty in walking, not elsewhere classified: Secondary | ICD-10-CM | POA: Diagnosis not present

## 2013-10-27 LAB — GLUCOSE, CAPILLARY
Glucose-Capillary: 269 mg/dL — ABNORMAL HIGH (ref 70–99)
Glucose-Capillary: 202 mg/dL — ABNORMAL HIGH (ref 70–99)

## 2013-10-27 MED ORDER — DEXAMETHASONE 2 MG PO TABS: 2.0000 mg | ORAL_TABLET | Freq: Every day | ORAL | Status: DC

## 2013-10-27 MED ORDER — DEXAMETHASONE SODIUM PHOSPHATE 4 MG/ML IJ SOLN: 2.0000 mg | INTRAMUSCULAR | Status: DC

## 2013-10-27 NOTE — Clinical Documentation Improvement (Signed)
Hospitalist  MD's, PA's, and NP's    In interpretation of MRI of head "vasogenic edema" is mentioned but it is not listed under diagnoses.  If this diagnosis is  an appropriate secondary diagnosis please document in notes and discharge summary.  Thank you   Possible Clinical Conditions?  Cerebral Edema  Vasogenic edema  Other Condition  Cannot Clinically Determine    Risk Factors: SDH  Diagnostics: MRI of the head  Treatment:  Decadron IV  Thank You, Ree Kida ,RN Clinical Documentation Specialist:  [phone]  [facility] Information Management

## 2013-10-27 NOTE — Progress Notes (Signed)
Talked to patient about DCP with Soc Worker Constance Holster and Asst Director [name] Malta present; her niece is unable to stay with her at discharge and the patient does not have anyone else to assist her at home; Patient is agreeable to go to a nursing home until she can work out arrangements at home; Mindi Slicker RN,BSN,MHA [phone]

## 2013-10-27 NOTE — Progress Notes (Signed)
Stable. Wound dry. Waiting for help at home before dc

## 2013-10-27 NOTE — Discharge Summary (Signed)
Physician Discharge Summary  Patient ID: [name] MRN: [id number] DOB/AGE: 1929-09-20 79 y.o.  Admit date: 10/09/2013 Discharge date: 10/27/2013  Admission Diagnoses:cerebral meningioma  Discharge Diagnoses:  Principal Problem:   Subdural hemorrhage Active Problems:   DM w/o Complication Type II   HYPERLIPIDEMIA   HYPERTENSION   GERD   Syncope   Carotid artery disease   Right frontal lobe mass   Anxiety   Compression fracture of T12 vertebra   Brain tumor (benign)   Discharged Condition: no weakness  Hospital Course: surgery  Consults rehabilitation medicine  Significant Diagnostic Studies: mri  Treatments: craniotomy  Discharge Exam: Blood pressure 136/61, pulse 79, temperature 97.5 F (36.4 C), temperature source Oral, resp. rate 17, height 5' 7.5" (1.715 m), weight 135 lb 12.9 oz (61.6 kg), SpO2 97.00%. No weakness. Wound healing  Disposition: SNF     Medication List    ASK your doctor about these medications       acetaminophen 500 MG tablet  Commonly known as:  TYLENOL  Take 1,000 mg by mouth every 6 (six) hours as needed for pain.     ALPRAZolam 0.25 MG tablet  Commonly known as:  XANAX  Take 0.25 mg by mouth at bedtime as needed for anxiety.     calcium carbonate 600 MG Tabs tablet  Commonly known as:  OS-CAL  Take 600 mg by mouth daily.     cholecalciferol 1000 UNITS tablet  Commonly known as:  VITAMIN D  Take 1,000 Units by mouth daily.     colestipol 1 G tablet  Commonly known as:  COLESTID  Take 2 g by mouth 2 (two) times daily.     cyanocobalamin 1000 MCG/ML injection  Commonly known as:  (VITAMIN B-12)  Inject 1 mL (1,000 mcg total) into the muscle every 21 ( twenty-one) days.     ferrous sulfate 325 (65 FE) MG tablet  Take 325 mg by mouth daily with breakfast.     fexofenadine 60 MG tablet  Commonly known as:  ALLEGRA  Take 60 mg by mouth daily.     fish oil-omega-3 fatty acids 1000 MG capsule  Take 1 g by mouth daily.     lidocaine 5 %  Commonly known as:  LIDODERM  Place 1 patch onto the skin daily. Remove & Discard patch within 12 hours or as directed by MD     polyvinyl alcohol 1.4 % ophthalmic solution  Commonly known as:  LIQUIFILM TEARS  Place 1 drop into both eyes daily as needed for dry eyes.     ranitidine 150 MG capsule  Commonly known as:  ZANTAC  Take 150 mg by mouth daily as needed for heartburn.     SitaGLIPtin-MetFORMIN HCl [phone] MG Tb24  Commonly known as:  JANUMET XR  Take 1 tablet by mouth daily.     trandolapril-verapamil 2-180 MG per tablet  Commonly known as:  TARKA  Take 1 tablet by mouth daily.         Signed: Floyce Stakes 10/27/2013, 2:26 PM  Cerebral meningiom

## 2013-10-27 NOTE — Progress Notes (Signed)
Results for TANYLA, STEGE (MRN [id number]) as of 10/27/2013 09:27  Ref. Range 10/26/2013 06:39 10/26/2013 12:07 10/26/2013 17:05 10/26/2013 22:25 10/27/2013 06:12  Glucose-Capillary Latest Range: 70-99 mg/dL 60 (L) 327 (H) 244 (H) 246 (H) 269 (H)   CBGs greater than 180 mg/dl.  Recommend increasing Novolog meal coverage to 6 units TID if CBGs continue to be elevated.  Will follow.  Harvel Ricks RN BSN CDE

## 2013-10-27 NOTE — Progress Notes (Signed)
Patient's staples and stitches removed, patient tolerated well.

## 2013-10-27 NOTE — Progress Notes (Signed)
CSW met with pt who is agreeable to SNF placement. CSW to do a SNF search and assist with dc today.  Full assessment to follow.  Hunt Oris, MSW, [place]

## 2013-10-27 NOTE — Progress Notes (Signed)
Called the MD's office to let him know that the social worker has gotten a place for the patient, was given a number to page and i did call and the OR called his aware about signing the [facility]

## 2013-10-27 NOTE — Progress Notes (Signed)
Patient is been transferred to a nursing home, report called to [facility] the receiving nurse. Condition stable

## 2013-10-27 NOTE — Progress Notes (Signed)
Clinical Social Work Department CLINICAL SOCIAL WORK PLACEMENT NOTE 10/27/2013  Patient:  [name], [name]  Account Number:  [id number] Admit date:  10/09/2013  Clinical Social Worker:  Hunt Oris, Latanya Presser  Date/time:  10/27/2013 03:21 PM  Clinical Social Work is seeking post-discharge placement for this patient at the following level of care:   SKILLED NURSING   (*CSW will update this form in Epic as items are completed)   10/27/2013  Patient/family provided with [place] Department of Clinical Social Work's list of facilities offering this level of care within the geographic area requested by the patient (or if unable, by the patient's family).  10/27/2013  Patient/family informed of their freedom to choose among providers that offer the needed level of care, that participate in Medicare, Medicaid or managed care program needed by the patient, have an available bed and are willing to accept the patient.    Patient/family informed of [facility]' ownership interest in [facility], as well as of the fact that they are under no obligation to receive care at this facility.  PASARR submitted to EDS on 10/27/2013 PASARR number received on 10/27/2013  FL2 transmitted to all facilities in geographic area requested by pt/family on  10/27/2013 FL2 transmitted to all facilities within larger geographic area on   Patient informed that his/her managed care company has contracts with or will negotiate with  certain facilities, including the following:     Patient/family informed of bed offers received:  10/27/2013 Patient chooses bed at [facility] Physician recommends and patient chooses bed at    Patient to be transferred to [facility] on  10/27/2013 Patient to be transferred to facility by [facility] Patient and family notified of transfer on 10/27/2013 Name of family member notified:  [name]  The following physician request were  entered in Epic:   Additional Comments:  Hunt Oris, MSW, [place]

## 2013-10-27 NOTE — Progress Notes (Signed)
CSW provided bed offers. Pt adamant she wants to go home however agreeable agreeable to SNF placement as she does not have assistance at home. Pt has chosen placement at [facility], message left with admissions rep. CSW requested RN to contact MD for dc paperwork and FL2 to be signed.  Hunt Oris, MSW, [place]

## 2013-11-03 DIAGNOSIS — R55 Syncope and collapse: Secondary | ICD-10-CM | POA: Diagnosis not present

## 2013-11-03 DIAGNOSIS — C50919 Malignant neoplasm of unspecified site of unspecified female breast: Secondary | ICD-10-CM | POA: Diagnosis not present

## 2013-11-03 DIAGNOSIS — I6200 Nontraumatic subdural hemorrhage, unspecified: Secondary | ICD-10-CM | POA: Diagnosis not present

## 2013-11-03 DIAGNOSIS — I10 Essential (primary) hypertension: Secondary | ICD-10-CM | POA: Diagnosis not present

## 2013-11-03 DIAGNOSIS — D320 Benign neoplasm of cerebral meninges: Secondary | ICD-10-CM | POA: Diagnosis not present

## 2013-11-03 DIAGNOSIS — S22009A Unspecified fracture of unspecified thoracic vertebra, initial encounter for closed fracture: Secondary | ICD-10-CM | POA: Diagnosis not present

## 2013-11-06 ENCOUNTER — Ambulatory Visit: Payer: Medicare Other | Admitting: Cardiology

## 2013-11-16 ENCOUNTER — Telehealth: Payer: Self-pay | Admitting: Internal Medicine

## 2013-11-16 NOTE — Telephone Encounter (Signed)
Caller: Carolyn/Other; Phone: [phone]; Reason for Call: Carolyn/Niece is calling for Pt needing Keppra clarification.  Pt was placed on Keppra after Craniotomy on 6-17 by Dr Joya Salm, Neurologist.  Pt doesn't have f/u appt w/ Neuro.  Pt had rehab at [facility] post Craniotomy, was taking Keppra while at facility.  Pt is completely out of Keppra now she is home.  Pt/Niece not certain how long Pt was to be placed on Keppra.  Per Niece, Pt was advised to f/u w/ Dr Arnoldo Morale.  Per EPIC, last OV w/ Dr Arnoldo Morale was 02-2013.  Pt is asymptomatic at triage.  Please review w/ Dr Arnoldo Morale if MD would like to have f/u appt w/ Pt to discuss her Keppra, appt not offered d/t RN not certain what length of time Pt needed for appt or if Dr Arnoldo Morale feels Pt should f/u w/ Neuro.  Pt has been out of [place] for several days.  Please call Niece/Pt back.

## 2013-11-16 NOTE — Telephone Encounter (Signed)
Please advise 

## 2013-11-17 ENCOUNTER — Telehealth: Payer: Self-pay | Admitting: Cardiology

## 2013-11-17 ENCOUNTER — Encounter: Payer: Self-pay | Admitting: Cardiology

## 2013-11-17 ENCOUNTER — Emergency Department (HOSPITAL_COMMUNITY)
Admission: EM | Admit: 2013-11-17 | Discharge: 2013-11-17 | Disposition: A | Discharge status: Home / Self Care | Payer: Medicare Other | Attending: Emergency Medicine | Admitting: Emergency Medicine

## 2013-11-17 ENCOUNTER — Emergency Department (HOSPITAL_COMMUNITY): Payer: Medicare Other

## 2013-11-17 ENCOUNTER — Encounter (HOSPITAL_COMMUNITY): Payer: Self-pay | Admitting: Emergency Medicine

## 2013-11-17 DIAGNOSIS — E538 Deficiency of other specified B group vitamins: Secondary | ICD-10-CM | POA: Insufficient documentation

## 2013-11-17 DIAGNOSIS — D332 Benign neoplasm of brain, unspecified: Secondary | ICD-10-CM | POA: Diagnosis not present

## 2013-11-17 DIAGNOSIS — Z85820 Personal history of malignant melanoma of skin: Secondary | ICD-10-CM | POA: Diagnosis not present

## 2013-11-17 DIAGNOSIS — K219 Gastro-esophageal reflux disease without esophagitis: Secondary | ICD-10-CM | POA: Insufficient documentation

## 2013-11-17 DIAGNOSIS — D649 Anemia, unspecified: Secondary | ICD-10-CM | POA: Insufficient documentation

## 2013-11-17 DIAGNOSIS — Y92009 Unspecified place in unspecified non-institutional (private) residence as the place of occurrence of the external cause: Secondary | ICD-10-CM | POA: Diagnosis not present

## 2013-11-17 DIAGNOSIS — Z860100 Personal history of colon polyps, unspecified: Secondary | ICD-10-CM | POA: Insufficient documentation

## 2013-11-17 DIAGNOSIS — Z79899 Other long term (current) drug therapy: Secondary | ICD-10-CM | POA: Diagnosis not present

## 2013-11-17 DIAGNOSIS — I10 Essential (primary) hypertension: Secondary | ICD-10-CM | POA: Insufficient documentation

## 2013-11-17 DIAGNOSIS — E785 Hyperlipidemia, unspecified: Secondary | ICD-10-CM | POA: Insufficient documentation

## 2013-11-17 DIAGNOSIS — Z85828 Personal history of other malignant neoplasm of skin: Secondary | ICD-10-CM | POA: Insufficient documentation

## 2013-11-17 DIAGNOSIS — Y9389 Activity, other specified: Secondary | ICD-10-CM | POA: Diagnosis not present

## 2013-11-17 DIAGNOSIS — W1809XA Striking against other object with subsequent fall, initial encounter: Secondary | ICD-10-CM | POA: Insufficient documentation

## 2013-11-17 DIAGNOSIS — S1093XA Contusion of unspecified part of neck, initial encounter: Secondary | ICD-10-CM

## 2013-11-17 DIAGNOSIS — Z8781 Personal history of (healed) traumatic fracture: Secondary | ICD-10-CM | POA: Diagnosis not present

## 2013-11-17 DIAGNOSIS — G8929 Other chronic pain: Secondary | ICD-10-CM | POA: Insufficient documentation

## 2013-11-17 DIAGNOSIS — Z880 Allergy status to penicillin: Secondary | ICD-10-CM | POA: Insufficient documentation

## 2013-11-17 DIAGNOSIS — Z853 Personal history of malignant neoplasm of breast: Secondary | ICD-10-CM | POA: Insufficient documentation

## 2013-11-17 DIAGNOSIS — Z923 Personal history of irradiation: Secondary | ICD-10-CM | POA: Insufficient documentation

## 2013-11-17 DIAGNOSIS — IMO0002 Reserved for concepts with insufficient information to code with codable children: Secondary | ICD-10-CM | POA: Diagnosis not present

## 2013-11-17 DIAGNOSIS — E119 Type 2 diabetes mellitus without complications: Secondary | ICD-10-CM | POA: Insufficient documentation

## 2013-11-17 DIAGNOSIS — Z48811 Encounter for surgical aftercare following surgery on the nervous system: Secondary | ICD-10-CM | POA: Diagnosis not present

## 2013-11-17 DIAGNOSIS — E669 Obesity, unspecified: Secondary | ICD-10-CM | POA: Insufficient documentation

## 2013-11-17 DIAGNOSIS — S0990XA Unspecified injury of head, initial encounter: Secondary | ICD-10-CM | POA: Insufficient documentation

## 2013-11-17 DIAGNOSIS — S0003XA Contusion of scalp, initial encounter: Secondary | ICD-10-CM | POA: Insufficient documentation

## 2013-11-17 DIAGNOSIS — W19XXXA Unspecified fall, initial encounter: Secondary | ICD-10-CM

## 2013-11-17 DIAGNOSIS — F411 Generalized anxiety disorder: Secondary | ICD-10-CM | POA: Diagnosis not present

## 2013-11-17 DIAGNOSIS — S0083XA Contusion of other part of head, initial encounter: Secondary | ICD-10-CM | POA: Insufficient documentation

## 2013-11-17 DIAGNOSIS — Z8739 Personal history of other diseases of the musculoskeletal system and connective tissue: Secondary | ICD-10-CM | POA: Diagnosis not present

## 2013-11-17 DIAGNOSIS — S0081XA Abrasion of other part of head, initial encounter: Secondary | ICD-10-CM

## 2013-11-17 LAB — URINALYSIS, ROUTINE W REFLEX MICROSCOPIC
Bilirubin Urine: NEGATIVE
Glucose, UA: NEGATIVE mg/dL
Hgb urine dipstick: NEGATIVE
Ketones, ur: NEGATIVE mg/dL
Nitrite: NEGATIVE
Protein, ur: NEGATIVE mg/dL
Specific Gravity, Urine: 1.023 (ref 1.005–1.030)
Urobilinogen, UA: 0.2 mg/dL (ref 0.0–1.0)
pH: 5 (ref 5.0–8.0)

## 2013-11-17 LAB — URINE MICROSCOPIC-ADD ON

## 2013-11-17 LAB — I-STAT CHEM 8, ED
BUN: 6 mg/dL (ref 6–23)
Calcium, Ion: 1.17 mmol/L (ref 1.13–1.30)
Chloride: 95 mEq/L — ABNORMAL LOW (ref 96–112)
Creatinine, Ser: 0.6 mg/dL (ref 0.50–1.10)
Glucose, Bld: 194 mg/dL — ABNORMAL HIGH (ref 70–99)
HCT: 30 % — ABNORMAL LOW (ref 36.0–46.0)
Hemoglobin: 10.2 g/dL — ABNORMAL LOW (ref 12.0–15.0)
Potassium: 4.1 mEq/L (ref 3.7–5.3)
Sodium: 129 mEq/L — ABNORMAL LOW (ref 137–147)
TCO2: 21 mmol/L (ref 0–100)

## 2013-11-17 LAB — CBG MONITORING, ED: Glucose-Capillary: 194 mg/dL — ABNORMAL HIGH (ref 70–99)

## 2013-11-17 MED ORDER — LEVETIRACETAM 500 MG PO TABS: 500.0000 mg | ORAL_TABLET | Freq: Two times a day (BID) | ORAL | Status: DC

## 2013-11-17 MED ORDER — LIDOCAINE 5 % EX PTCH: 1.0000 | MEDICATED_PATCH | CUTANEOUS | Status: DC

## 2013-11-17 MED ORDER — CIPROFLOXACIN HCL 250 MG PO TABS: 250.0000 mg | ORAL_TABLET | Freq: Two times a day (BID) | ORAL | Status: DC

## 2013-11-17 NOTE — ED Notes (Signed)
Pt discussed with dr Eulis Foster.  c-t head ordered

## 2013-11-17 NOTE — Telephone Encounter (Signed)
Spoke with pt's niece, Hoyle Sauer to advise of Dr. Arnoldo Morale note. Per Dr. Arnoldo Morale, pt should schedule appt with Dr. Doug Sou at [facility] to establish.   During call, Hoyle Sauer says that pt fell today and wanted Dr. Arnoldo Morale to order a test to see if her brain is bleeding. Advised that she needs to go to the ER NOW due to recent brain surgery. Pt and niece verbalized understanding

## 2013-11-17 NOTE — Telephone Encounter (Signed)
The pt had an OV with Dr Ron Parker on 5/5 for syncope. At that OV Dr Ron Parker ordered an Echo which was normal and referred her to EP for possible LINQ implantation. Since that visit the pt has been diagnosed with a brain tumor that she thinks was causing most of her problems. She wants to know if she needs to keep her f/u appointment with Dr Ron Parker that is scheduled for 7/30. Please advise.

## 2013-11-17 NOTE — Telephone Encounter (Signed)
New message  Pt called to cancel appt for 11/30/2013 with Dr. Katz/// Pt states that her MRI was completed in the hospital and found that the problem was a brain tumer.. She believes that she doesnt need the appt anymore. Please call to confirm with pt that the appt is not needed before we cancel the appt.

## 2013-11-17 NOTE — ED Notes (Signed)
The pts pupils are unequal.  Lt is 4.0 reacts and her rt pupil is 2 and reactive. Cataract performed on the rt eye and she has acontact lens in that eye

## 2013-11-17 NOTE — ED Provider Notes (Signed)
CSN: [id number]     Arrival date & time 11/17/13  1814 History   First MD Initiated Contact with Patient 11/17/13 1950     Chief Complaint  Patient presents with  . Fall     (Consider location/radiation/quality/duration/timing/severity/associated sxs/prior Treatment) Patient is a 80 y.o. female presenting with fall. The history is provided by the patient.  Fall   She was at home today, when she fell. She states that she stood up, felt weak and tumbled forward. She struck her head on a coffee table. There was no other prodrome. She was able to get up and walk on her own, afterwards. No other injuries. She was discharged from rehabilitation, yesterday, after removal of brain tumor and 10/09/13. There are no other known modifying factors.   Past Medical History  Diagnosis Date  . Seasonal allergies   . HTN (hypertension)   . Osteoarthritis   . Palpitations   . Anemia   . History of colonic polyps   . GERD (gastroesophageal reflux disease)   . Hyperlipidemia   . Chronic facial pain   . Vitamin B12 deficiency   . Dyslipidemia   . Chronic foot pain   . Chronic cough   . Obesity   . Anxiety   . Skin cancer   . Complication of anesthesia     Sore jaw; could not chew or move mouth  . Diabetes mellitus     type 2 niddm x 20 years  . Melanoma   . Metatarsal bone fracture 2014  . Hammer toe     bilateral  . Cancer of right breast 12/26/12    right breast 12:00 o'clock, DCIS  . Hx of radiation therapy 03/07/13- 03/29/13    right breast 4250 cGy 17 sessions  . Ejection fraction    Past Surgical History  Procedure Laterality Date  . Colonoscopy    . Polypectomy      small adenomatous  . Abdominal hysterectomy    . Cholecystectomy    . Hernia repair    . Knee arthroscopy Bilateral   . Ulnar tunnel release    . Eye surgery    . Cataract extraction w/ intraocular lens implant Right   . Bunionectomy    . Breast lumpectomy with needle localization Right 01/17/2013   Procedure: BREAST LUMPECTOMY WITH NEEDLE LOCALIZATION;  Surgeon: Edward Jolly, MD;  Location: [facility];  Service: General;  Laterality: Right;  . Breast biopsy Right 01/24/2013    Procedure: RE-EXCICION OF BREAST CANCER, ANTERIOR MARGINS;  Surgeon: Edward Jolly, MD;  Location: [facility];  Service: General;  Laterality: Right;  . Craniotomy Right 10/18/2013    Procedure: CRANIOTOMY TUMOR EXCISION;  Surgeon: Floyce Stakes, MD;  Location: [facility];  Service: Neurosurgery;  Laterality: Right;   Family History  Problem Relation Age of Onset  . Colon cancer    . Heart disease Mother   . Diabetes Father   . Pancreatic cancer Father   . Bone cancer Sister   . Prostate cancer Brother   . Colon cancer Brother   . Rectal cancer Sister   . Thyroid disease Sister     benign goiter resected   History  Substance Use Topics  . Smoking status: Never Smoker   . Smokeless tobacco: Not on file  . Alcohol Use: No   OB History   Grav Para Term Preterm Abortions TAB SAB Ect Mult Living  Review of Systems  All other systems reviewed and are negative.     Allergies  Sular; Tegretol; Ace inhibitors; Actonel; Amlodipine besylate; Aspirin; Atacand; Bextra; Bisoprolol-hydrochlorothiazide; Bystolic; Candesartan cilexetil; Cefadroxil; Cefdinir; Cholestyramine; Clonidine; Clonidine hydrochloride; Codeine; Ezetimibe; Hydrazine yellow; Hydrocodone; Hydrocodone-acetaminophen; Meloxicam; Methylprednisolone sodium succinate; Morphine; Nabumetone; Naproxen; Niacin; Niaspan; Nisoldipine; Norvasc; Optivar; Penicillins; Pseudoephedrine-guaifenesin; Risedronate sodium; Rofecoxib; Ru-tuss; Sulfonamide derivatives; [place]; Telithromycin; Telmisartan; Terfenadine; Trandolapril-verapamil hcl er; Ziac; Celecoxib; Iodinated diagnostic agents; and Valium  Home Medications   Prior to Admission medications   Medication Sig Start Date End Date Taking? Authorizing Provider  acetaminophen (TYLENOL)  500 MG tablet Take 1,000 mg by mouth every 6 (six) hours as needed for pain.    Historical Provider, MD  ALPRAZolam Duanne Moron) 0.25 MG tablet Take 0.25 mg by mouth at bedtime as needed for anxiety. 02/13/11   Ricard Dillon, MD  calcium carbonate (OS-CAL) 600 MG TABS Take 600 mg by mouth daily.     Historical Provider, MD  cholecalciferol (VITAMIN D) 1000 UNITS tablet Take 1,000 Units by mouth daily.     Historical Provider, MD  colestipol (COLESTID) 1 G tablet Take 2 g by mouth 2 (two) times daily.    Historical Provider, MD  cyanocobalamin (,VITAMIN B-12,) 1000 MCG/ML injection Inject 1 mL (1,000 mcg total) into the muscle every 21 ( twenty-one) days. 03/14/13   Ricard Dillon, MD  ferrous sulfate 325 (65 FE) MG tablet Take 325 mg by mouth daily with breakfast.     Historical Provider, MD  fexofenadine (ALLEGRA) 60 MG tablet Take 60 mg by mouth daily.     Historical Provider, MD  fish oil-omega-3 fatty acids 1000 MG capsule Take 1 g by mouth daily.     Historical Provider, MD  levETIRAcetam (KEPPRA) 500 MG tablet Take 1 tablet (500 mg total) by mouth 2 (two) times daily. 11/17/13   Ricard Dillon, MD  lidocaine (LIDODERM) 5 % Place 1 patch onto the skin daily. Remove & Discard patch within 12 hours or as directed by MD 11/17/13   Ricard Dillon, MD  polyvinyl alcohol (LIQUIFILM TEARS) 1.4 % ophthalmic solution Place 1 drop into both eyes daily as needed for dry eyes.     Historical Provider, MD  ranitidine (ZANTAC) 150 MG capsule Take 150 mg by mouth daily as needed for heartburn.     Historical Provider, MD  SitaGLIPtin-MetFORMIN HCl (JANUMET XR) [phone] MG TB24 Take 1 tablet by mouth daily. 02/21/13   Ricard Dillon, MD  trandolapril-verapamil (TARKA) 2-180 MG per tablet Take 1 tablet by mouth daily.    Historical Provider, MD   BP 155/77  Pulse 78  Temp(Src) 97.7 F (36.5 C) (Oral)  Resp 18  SpO2 99% Physical Exam  Nursing note and vitals reviewed. Constitutional: She is oriented to person,  place, and time. She appears well-developed.  Elderly, frail  HENT:  Head: Normocephalic and atraumatic.  Healing right craniotomy wound without dehiscence, or associated swelling. Mild swelling over right zygoma without associated crepitation. Small abrasion over right zygoma, not bleeding.  Eyes: Conjunctivae and EOM are normal. Pupils are equal, round, and reactive to light.  No pupillary asymmetry, when examined.  Neck: Normal range of motion and phonation normal. Neck supple.  Cardiovascular: Normal rate, regular rhythm and intact distal pulses.   Pulmonary/Chest: Effort normal and breath sounds normal. She exhibits no tenderness.  Abdominal: Soft. She exhibits no distension. There is no tenderness. There is no guarding.  Musculoskeletal: Normal range of motion.  Neurological:  She is alert and oriented to person, place, and time. She exhibits normal muscle tone.  Skin: Skin is warm and dry.  Psychiatric: She has a normal mood and affect. Her behavior is normal. Judgment and thought content normal.    ED Course  Procedures (including critical care time) Medications - No data to display  Patient Vitals for the past 24 hrs:  BP Temp Temp src Pulse Resp SpO2  11/17/13 1842 155/77 mmHg 97.7 F (36.5 C) Oral 78 18 99 %    8:45 PM Reevaluation with update and discussion. After initial assessment and treatment, an updated evaluation reveals No further c/o. She would like to have an antibiotic prescription. Findings discussed with pt.and family member. Maven Rosander L    Labs Review Labs Reviewed  CBG MONITORING, ED - Abnormal; Notable for the following:    Glucose-Capillary 194 (*)    All other components within normal limits  I-STAT CHEM 8, ED - Abnormal; Notable for the following:    Sodium 129 (*)    Chloride 95 (*)    Glucose, Bld 194 (*)    Hemoglobin 10.2 (*)    HCT 30.0 (*)    All other components within normal limits  URINE CULTURE  URINALYSIS, ROUTINE W REFLEX  MICROSCOPIC    Imaging Review Ct Head Wo Contrast  11/17/2013   CLINICAL DATA:  Status post fall  EXAM: CT HEAD WITHOUT CONTRAST  TECHNIQUE: Contiguous axial images were obtained from the base of the skull through the vertex without intravenous contrast.  COMPARISON:  10/14/2013  FINDINGS: There is interval removal of the low large right frontal meningioma. There is right frontal white matter low attenuation which Antu reflect gliosis versus edema from the large meningioma which has been subsequently removed. There is no evidence of mass effect, midline shift, or extra-axial fluid collections. There is no evidence of a space-occupying lesion or intracranial hemorrhage. There is no evidence of a cortical-based area of acute infarction. There is right frontal lobe encephalomalacia. There is generalized cerebral atrophy. There is periventricular white matter low attenuation likely secondary to microangiopathy.  The ventricles and sulci are appropriate for the patient's age. The basal cisterns are patent.  Visualized portions of the orbits are unremarkable. The visualized portions of the paranasal sinuses and mastoid air cells are unremarkable. Cerebrovascular atherosclerotic calcifications are noted.  There is evidence of prior right frontal craniotomy.  IMPRESSION: 1. No acute intracranial pathology. 2. Interval removal of the large right frontal meningioma with right frontal lobe encephalomalacia. There is right frontal white matter low attenuation which Trick reflect gliosis versus edema from the large meningioma which has been subsequently removed.   Electronically Signed   By: Kathreen Devoid   On: 11/17/2013 19:23     EKG Interpretation None      MDM   Final diagnoses:  Fall, initial encounter  Head injury, initial encounter  Contusion of face, initial encounter  Abrasion of face, initial encounter    Fall, cause not clear. Minor injuries. Possible UTI, but she is asymptomatic for it. She  preferred trial of empiric antibiotics. Cultures pending.  Nursing Notes Reviewed/ Care Coordinated Applicable Imaging Reviewed Interpretation of Laboratory Data incorporated into ED treatment  The patient appears reasonably screened and/or stabilized for discharge and I doubt any other medical condition or other [facility] requiring further screening, evaluation, or treatment in the ED at this time prior to discharge.  Plan: Home Medications- Cipro; Home Treatments- rest; return here if the recommended treatment, does  not improve the symptoms; Recommended follow up- PCP 1 week for check up    Richarda Blade, MD 11/19/13 1308

## 2013-11-17 NOTE — Progress Notes (Unsigned)
Patient ID: [name], female   DOB: 1929/06/27, 80 y.o.   MRN: [id number] Plans were being made for ILR implantaion by Dr. Lovena Le to help assess syncope. In the meantime, she has been diagnosed with a large meningioma that was removed surgically. It seems this was probably the cause of her problems. No further cardiac evaluation will be planned at  this time.  Daryel November, MD

## 2013-11-17 NOTE — Telephone Encounter (Signed)
Left message for [name] to call back

## 2013-11-17 NOTE — Telephone Encounter (Signed)
Need to either establish the patient with a primary care physician here at [facility] will refer her to neurology for followup. I believe it is important for her to continue taking the [place] and [place] give her a refill of this medication until she has an appointment to followup with her new primary care physician

## 2013-11-17 NOTE — ED Notes (Signed)
The pt fell earlier today .  She just had brain surgery  And  Was just discharged yesterday.  She had a brain tumor removed.  She fell onto the incision .  When she first fell today she could not see from her rt eye but now the vision is coming back.  No blood thinners

## 2013-11-17 NOTE — ED Notes (Signed)
The pt has some redness rt side of her face and she struck her incision.

## 2013-11-17 NOTE — Telephone Encounter (Signed)
She does NOT need to see cardiology in follow-up.

## 2013-11-17 NOTE — Discharge Instructions (Signed)
Abrasions An abrasion is a cut or scrape of the skin. Abrasions do not go through all layers of the skin. HOME CARE  If a bandage (dressing) was put on your wound, change it as told by your doctor. If the bandage sticks, soak it off with warm.  Wash the area with water and soap 2 times a day. Rinse off the soap. Pat the area dry with a clean towel.  Put on medicated cream (ointment) as told by your doctor.  Change your bandage right away if it gets wet or dirty.  Only take medicine as told by your doctor.  See your doctor within 24-48 hours to get your wound checked.  Check your wound for redness, puffiness (swelling), or yellowish-white fluid (pus). GET HELP RIGHT AWAY IF:   You have more pain in the wound.  You have redness, swelling, or tenderness around the wound.  You have pus coming from the wound.  You have a fever or lasting symptoms for more than 2-3 days.  You have a fever and your symptoms suddenly get worse.  You have a bad smell coming from the wound or bandage. MAKE SURE YOU:   Understand these instructions.  Will watch your condition.  Will get help right away if you are not doing well or get worse. Document Released: 10/07/2007 Document Revised: 01/13/2012 Document Reviewed: 03/24/2011 [facility] Patient Information 2015 [place], [place]. This information is not intended to replace advice given to you by your health care provider. Make sure you discuss any questions you have with your health care provider.  Contusion A contusion is a deep bruise. Contusions happen when an injury causes bleeding under the skin. Signs of bruising include pain, puffiness (swelling), and discolored skin. The contusion Poupard turn blue, purple, or yellow. HOME CARE   Put ice on the injured area.  Put ice in a plastic bag.  Place a towel between your skin and the bag.  Leave the ice on for 15-20 minutes, 03-04 times a day.  Only take medicine as told by your doctor.  Rest the  injured area.  If possible, raise (elevate) the injured area to lessen puffiness. GET HELP RIGHT AWAY IF:   You have more bruising or puffiness.  You have pain that is getting worse.  Your puffiness or pain is not helped by medicine. MAKE SURE YOU:   Understand these instructions.  Will watch your condition.  Will get help right away if you are not doing well or get worse. Document Released: 10/07/2007 Document Revised: 07/13/2011 Document Reviewed: 02/23/2011 [facility] Patient Information 2015 [place], [place]. This information is not intended to replace advice given to you by your health care provider. Make sure you discuss any questions you have with your health care provider.  Head Injury, Adult You have a head injury. Headaches and throwing up (vomiting) are common after a head injury. It should be easy to wake up from sleeping. Sometimes you must stay in the hospital. Most problems happen within the first 24 hours. Side effects Botkins occur up to 7-10 days after the injury.  WHAT ARE THE TYPES OF HEAD INJURIES? Head injuries can be as minor as a bump. Some head injuries can be more severe. More severe head injuries include:  A jarring injury to the brain (concussion).  A bruise of the brain (contusion). This mean there is bleeding in the brain that can cause swelling.  A cracked skull (skull fracture).  Bleeding in the brain that collects, clots, and forms  a bump (hematoma). . WHEN SHOULD I GET HELP RIGHT AWAY?   You are confused or sleepy.  You cannot be woken up.  You feel sick to your stomach (nauseous) or keep throwing up.  Your dizziness or unsteadiness is get worse.  You have very bad, lasting headaches that are not helped by medicine.  You cannot use your arms or legs like normal  You cannot walk.  You notice changes in the black spots in the center of the colored part of your eye (pupil).  You have clear or bloody fluid coming from your nose or ears.  You  have trouble seeing. During the next 24 hours after the injury, you must stay with someone who can watch you. This person should get help right away (call 911 in the [place] if you start to shake and are not able to control it (seizures), you become pass out, or you are unable to wake up. HOW CAN I PREVENT A HEAD INJURY IN THE FUTURE?  Wear seat belts.  Wear helmets while bike riding and playing sports like football.  Stay away from dangerous activities around the house. WHEN CAN I RETURN TO NORMAL ACTIVITIES AND ATHLETICS? See your doctor before doing these activities. You should not do normal activities or play contact sports until 1 week after the following symptoms have stopped:  Headache that does not go away.  Dizziness.  Poor attention.  Confusion.  Memory problems.  Sickness to your stomach or throwing up.  Tiredness.  Fussiness.  Bothered by bright lights or loud noises.  Anxiousness or depression.  Restless sleep. MAKE SURE YOU:   Understand these instructions.  Will watch your condition.  Will get help right away if you are not doing well or get worse. Document Released: 04/02/2008 Document Revised: 02/08/2013 Document Reviewed: 12/26/2012 [facility] Patient Information 2015 [place], [place]. This information is not intended to replace advice given to you by your health care provider. Make sure you discuss any questions you have with your health care provider.

## 2013-11-19 LAB — URINE CULTURE: Colony Count: 45000

## 2013-11-21 DIAGNOSIS — Z48811 Encounter for surgical aftercare following surgery on the nervous system: Secondary | ICD-10-CM | POA: Diagnosis not present

## 2013-11-21 DIAGNOSIS — D332 Benign neoplasm of brain, unspecified: Secondary | ICD-10-CM | POA: Diagnosis not present

## 2013-11-22 NOTE — Telephone Encounter (Signed)
The pt is advised and she states that she now wants to keep her 7/30 appointment with Dr Ron Parker because her sister is wearing a heart monitor at this time and she states "just to be on the safe side I better keep that appointment". Will forward message to Dr Ron Parker as an Juluis Rainier.

## 2013-11-23 ENCOUNTER — Telehealth: Payer: Self-pay | Admitting: Internal Medicine

## 2013-11-23 DIAGNOSIS — Z48811 Encounter for surgical aftercare following surgery on the nervous system: Secondary | ICD-10-CM | POA: Diagnosis not present

## 2013-11-23 DIAGNOSIS — D332 Benign neoplasm of brain, unspecified: Secondary | ICD-10-CM | POA: Diagnosis not present

## 2013-11-23 NOTE — Telephone Encounter (Signed)
[name] from  [facility]  Occupation therapy pt is  2 times for 1 week and 1 time for 1 week.  Expect  discharge next week. Was for  meal preparation, energy conservation and exercise

## 2013-11-24 DIAGNOSIS — Z48811 Encounter for surgical aftercare following surgery on the nervous system: Secondary | ICD-10-CM | POA: Diagnosis not present

## 2013-11-24 DIAGNOSIS — D332 Benign neoplasm of brain, unspecified: Secondary | ICD-10-CM | POA: Diagnosis not present

## 2013-11-27 ENCOUNTER — Telehealth: Payer: Self-pay | Admitting: Internal Medicine

## 2013-11-27 DIAGNOSIS — Z48811 Encounter for surgical aftercare following surgery on the nervous system: Secondary | ICD-10-CM | POA: Diagnosis not present

## 2013-11-27 DIAGNOSIS — D332 Benign neoplasm of brain, unspecified: Secondary | ICD-10-CM | POA: Diagnosis not present

## 2013-11-27 NOTE — Telephone Encounter (Signed)
bayada wanted to inform that pt had a fall on yesterday, attending to changing her cat water, pt fell and landed on bottom on a iron coffee table and fell backwards and hit her head on a patio rail, pt declined seeing doctor, because they kept her 5 hrs in the er when she fell last time. Pt has bruise and puffiness around the right eye. bayada needs order to extend occupational therapy 1 additional time this week and 1 additional next week for pet care training.

## 2013-11-28 DIAGNOSIS — Z48811 Encounter for surgical aftercare following surgery on the nervous system: Secondary | ICD-10-CM | POA: Diagnosis not present

## 2013-11-28 DIAGNOSIS — D332 Benign neoplasm of brain, unspecified: Secondary | ICD-10-CM | POA: Diagnosis not present

## 2013-11-28 NOTE — Telephone Encounter (Signed)
Ok per Dr Arnoldo Morale, verbal order left on Don's cell phone

## 2013-11-30 ENCOUNTER — Ambulatory Visit (INDEPENDENT_AMBULATORY_CARE_PROVIDER_SITE_OTHER): Payer: Medicare Other | Admitting: Cardiology

## 2013-11-30 VITALS — BP 162/103 | HR 102 | Ht 67.5 in | Wt 136.0 lb

## 2013-11-30 DIAGNOSIS — I6529 Occlusion and stenosis of unspecified carotid artery: Secondary | ICD-10-CM

## 2013-11-30 DIAGNOSIS — I10 Essential (primary) hypertension: Secondary | ICD-10-CM

## 2013-11-30 DIAGNOSIS — I6200 Nontraumatic subdural hemorrhage, unspecified: Secondary | ICD-10-CM

## 2013-11-30 DIAGNOSIS — I779 Disorder of arteries and arterioles, unspecified: Secondary | ICD-10-CM

## 2013-11-30 DIAGNOSIS — R55 Syncope and collapse: Secondary | ICD-10-CM | POA: Diagnosis not present

## 2013-11-30 DIAGNOSIS — D332 Benign neoplasm of brain, unspecified: Secondary | ICD-10-CM | POA: Diagnosis not present

## 2013-11-30 DIAGNOSIS — I739 Peripheral vascular disease, unspecified: Secondary | ICD-10-CM

## 2013-11-30 DIAGNOSIS — Z48811 Encounter for surgical aftercare following surgery on the nervous system: Secondary | ICD-10-CM | POA: Diagnosis not present

## 2013-11-30 NOTE — Patient Instructions (Addendum)
Your physician recommends that you continue on your current medications as directed. Please refer to the Current Medication list given to you today.  Your physician has recommended that you wear an event monitor for 21 days. Event monitors are medical devices that record the heart's electrical activity. Doctors most often [place] these monitors to diagnose arrhythmias. Arrhythmias are problems with the speed or rhythm of the heartbeat. The monitor is a small, portable device. You can wear one while you do your normal daily activities. This is usually used to diagnose what is causing palpitations/syncope (passing out).  Dr Ron Parker is referring you to a Primary care MD within 2 weeks and Dr Ellin Goodie (neuro surgeon).  We have scheduled you an appointment with a physician assistant Megan Salon) on August 13 at 9:545 am  We will contact you concerning you referral to Dr Ellin Goodie (needs to be seen soon).  Your physician recommends that you schedule a follow-up appointment in: 5 weeks

## 2013-11-30 NOTE — Assessment & Plan Note (Signed)
The patient has mild carotid disease. No further workup.

## 2013-11-30 NOTE — Assessment & Plan Note (Signed)
The patient had a meningioma removed it was quite large in June, 2015. She needs neurosurgical and neurologic followup. I will help arrange this. It would seem that this might explain her falling spells. However we are still not sure.

## 2013-11-30 NOTE — Progress Notes (Signed)
Patient ID: [name], female   DOB: [birth year], 80 y.o.   MRN: [id number]    HPI  Patient is seen for followup episodes of syncope. I saw the patient in [place], 2015. I was concerned about her history of syncope. She had worn a Holter monitor showing no marked bradycardia or tachycardia arrhythmias. Because her episodes were relatively infrequent, I referred her for consideration of an implantable loop recorder. She was seen by Dr. Lovena Le. He felt that placing a loop recorder would be very reasonable approach. Plans were being made to do this. However, the patient was then admitted to the hospital. She turned out to have a large meningioma. This was successfully removed by Dr. Joya Salm. She is here today for followup. Unfortunately she continues to fall. It is very difficult to know if this is true syncope or not. I do not see any records of followup with neurosurgery or neurology since her hospitalization. I will help to arrange this.  As part of today's evaluation I have reviewed carefully her hospital records. I have reviewed also the costal done previously by Dr. Lovena Le.  Allergies  Allergen Reactions  . Bystolic [Nebivolol Hcl] Other (See Comments)    "extreme weakness, heaviness in legs & arms, swelling in legs/arms/face, swollen abdomen, pain in bladder, feet pain, soreness in chest"  . Cholestyramine     "itching rash on stomach, bloated, nausea, vomiting, sleeplessness, extreme pain in arms"  . Hydrazine Yellow [Tartrazine] Other (See Comments)    "does not reduce high blood pressure, pain in arm, high pressure, felt like I was on verge of heart attack, really weak"  . Morphine Other (See Comments)    Feels morbid, weak, still in pain  . Niacin Palpitations    Fast heart beat  . Niaspan [Niacin Er] Palpitations and Other (See Comments)    "fast heart beat, high blood pressure"  . Norvasc [Amlodipine Besylate] Other (See Comments)    "extreme fluid retention/pain)  . Optivar [Azelastine  Hcl] Photosensitivity  . Sular [Nisoldipine Er] Other (See Comments)    "severe headaches, swelling eyes, hands, feet, shortness of breath, weak, flushed face, brain boiling, fluid retention, high blood sugar, nervous, heart fast beating"  . Tegretol [Carbamazepine] Other (See Comments)    Blood poisoning   . Telmisartan Other (See Comments)    "headache, difficulty urinating, high blood sugar, fluid retention"  . Cefdinir Swelling    Vaginal irritation, breathing,   . Clonidine Other (See Comments)    Dry mouth, fluid retention  . Clonidine Hydrochloride Other (See Comments)    Dry mouth, fluid retention  . Codeine Nausea And Vomiting  . Ezetimibe Other (See Comments)    Made weak  . Naproxen Other (See Comments)    Shrinks bladder  . Ziac [Bisoprolol-Hydrochlorothiazide] Other (See Comments)    "stopped urination"  . Ace Inhibitors Other (See Comments)    unknown  . Actonel [Risedronate Sodium] Other (See Comments)    unknown  . Amlodipine Besylate Other (See Comments)    unknown  . Aspirin Other (See Comments)    unknown  . Atacand [Candesartan] Other (See Comments)    unknown  . Bextra [Valdecoxib] Other (See Comments)    unknown  . Bisoprolol-Hydrochlorothiazide Other (See Comments)    unknown  . Candesartan Cilexetil Other (See Comments)    unknown  . Cefadroxil Other (See Comments)    unknown  . Celecoxib Rash  . Hydrocodone Other (See Comments)    unknown  . Hydrocodone-Acetaminophen  Other (See Comments)    unknown  . Iodinated Diagnostic Agents Rash    "All over"  . Meloxicam Other (See Comments)    unknown  . Methylprednisolone Sodium Succinate Other (See Comments)    unknown  . Nabumetone Other (See Comments)    unknown  . Penicillins Other (See Comments)    unknown  . Pseudoephedrine-Guaifenesin Other (See Comments)    unknown  . Risedronate Sodium Other (See Comments)    unknown  . Rofecoxib Other (See Comments)    unknown  . Ru-Tuss  [Chlorphen-Pse-Atrop-Hyos-Scop] Other (See Comments)    unknown  . Sulfonamide Derivatives Other (See Comments)    unknown  . Sulphur [Sulfur] Other (See Comments)    unknown  . Telithromycin Other (See Comments)    unknown  . Terfenadine Other (See Comments)    unknown  . Trandolapril-Verapamil Hcl Er Other (See Comments)    Headache, difficulty urinating, high blood sugar, fluid retention  . Valium [Diazepam] Other (See Comments)    Makes her mean and hyper    Current Outpatient Prescriptions  Medication Sig Dispense Refill  . acetaminophen (TYLENOL) 500 MG tablet Take 1,000 mg by mouth every 6 (six) hours as needed for pain.      Marland Kitchen ALPRAZolam (XANAX) 0.25 MG tablet Take 0.25 mg by mouth at bedtime.       . calcium carbonate (OS-CAL) 600 MG TABS Take 600 mg by mouth daily.       . cholecalciferol (VITAMIN D) 1000 UNITS tablet Take 1,000 Units by mouth daily.       . ciprofloxacin (CIPRO) 250 MG tablet Take 1 tablet (250 mg total) by mouth every 12 (twelve) hours.  10 tablet  0  . colestipol (COLESTID) 1 G tablet Take 2 g by mouth 2 (two) times daily.      . ferrous sulfate 325 (65 FE) MG tablet Take 325 mg by mouth daily with breakfast.       . fexofenadine (ALLEGRA) 60 MG tablet Take 60 mg by mouth daily.       . fish oil-omega-3 fatty acids 1000 MG capsule Take 1 g by mouth daily.       Marland Kitchen levETIRAcetam (KEPPRA) 500 MG tablet Take 1 tablet (500 mg total) by mouth 2 (two) times daily.  180 tablet  0  . lidocaine (LIDODERM) 5 % Place 1 patch onto the skin daily. Remove & Discard patch within 12 hours or as directed by MD  90 patch  1  . ranitidine (ZANTAC) 150 MG capsule Take 150 mg by mouth daily as needed for heartburn.       . SitaGLIPtin-MetFORMIN HCl (JANUMET XR) [phone] MG TB24 Take 1 tablet by mouth daily.  90 tablet  3  . trandolapril-verapamil (TARKA) 2-180 MG per tablet Take 1 tablet by mouth daily.       Current Facility-Administered Medications  Medication Dose Route  Frequency Provider Last Rate Last Dose  . cyanocobalamin ((VITAMIN B-12)) injection 1,000 mcg  1,000 mcg Intramuscular Q30 days Ricard Dillon, MD   1,000 mcg at 02/13/11 3220    History   Social History  . Marital Status: Widowed    Spouse Name: N/A    Number of Children: 0  . Years of Education: N/A   Occupational History  . retired    Social History Main Topics  . Smoking status: Never Smoker   . Smokeless tobacco: Not on file  . Alcohol Use: No  . Drug Use: No  .  Sexual Activity: Yes     Comment: menarche age 23, hysterectomy age 70, HRT x 2-3 mos, G1- miscarriage   Other Topics Concern  . Not on file   Social History Narrative   Widowed, lives alone.  Ambulates independently.     Family History  Problem Relation Age of Onset  . Colon cancer    . Heart disease Mother   . Diabetes Father   . Pancreatic cancer Father   . Bone cancer Sister   . Prostate cancer Brother   . Colon cancer Brother   . Rectal cancer Sister   . Thyroid disease Sister     benign goiter resected    Past Medical History  Diagnosis Date  . Seasonal allergies   . HTN (hypertension)   . Osteoarthritis   . Palpitations   . Anemia   . History of colonic polyps   . GERD (gastroesophageal reflux disease)   . Hyperlipidemia   . Chronic facial pain   . Vitamin B12 deficiency   . Dyslipidemia   . Chronic foot pain   . Chronic cough   . Obesity   . Anxiety   . Skin cancer   . Complication of anesthesia     Sore jaw; could not chew or move mouth  . Diabetes mellitus     type 2 niddm x 20 years  . Melanoma   . Metatarsal bone fracture 2014  . Hammer toe     bilateral  . Cancer of right breast 12/26/12    right breast 12:00 o'clock, DCIS  . Hx of radiation therapy 03/07/13- 03/29/13    right breast 4250 cGy 17 sessions  . Ejection fraction     Past Surgical History  Procedure Laterality Date  . Colonoscopy    . Polypectomy      small adenomatous  . Abdominal hysterectomy    .  Cholecystectomy    . Hernia repair    . Knee arthroscopy Bilateral   . Ulnar tunnel release    . Eye surgery    . Cataract extraction w/ intraocular lens implant Right   . Bunionectomy    . Breast lumpectomy with needle localization Right 01/17/2013    Procedure: BREAST LUMPECTOMY WITH NEEDLE LOCALIZATION;  Surgeon: Edward Jolly, MD;  Location: [facility];  Service: General;  Laterality: Right;  . Breast biopsy Right 01/24/2013    Procedure: RE-EXCICION OF BREAST CANCER, ANTERIOR MARGINS;  Surgeon: Edward Jolly, MD;  Location: [facility];  Service: General;  Laterality: Right;  . Craniotomy Right 10/18/2013    Procedure: CRANIOTOMY TUMOR EXCISION;  Surgeon: Floyce Stakes, MD;  Location: [facility];  Service: Neurosurgery;  Laterality: Right;    Patient Active Problem List   Diagnosis Date Noted  . Brain tumor (benign) 10/18/2013  . Right frontal lobe mass 10/09/2013  . Subdural hemorrhage 10/09/2013  . Anxiety 10/09/2013  . Compression fracture of T12 vertebra 10/09/2013  . Ejection fraction   . Multiple thyroid nodules 09/05/2013  . Syncope 08/27/2013  . Carotid artery disease 08/27/2013  . Abnormal thyroid ultrasound 08/27/2013  . Cancer of right breast   . Cancer of central portion of female breast 12/30/2012  . OSTEOPOROSIS 03/06/2010  . TENSION TYPE HEADACHE UNSPECIFIED 07/11/2009  . ASTHMA 03/05/2009  . ACUTE MAXILLARY SINUSITIS 08/16/2008  . EUSTACHIAN TUBE DYSFUNCTION, RIGHT 03/21/2008  . IRRITABLE BOWEL SYNDROME  diarrhea type 03/21/2008  . COUGH 03/06/2008  . ANEMIA-NOS 12/16/2006  . GERD 12/16/2006  . COLONIC  POLYPS, HX OF 12/16/2006  . DM w/o Complication Type II 60/08/5995  . HYPERLIPIDEMIA 10/29/2006  . HYPERTENSION 10/29/2006  . Allergic rhinitis, cause unspecified 10/29/2006  . OSTEOARTHRITIS 10/29/2006  . HEADACHE 10/29/2006    ROS   Patient denies fever, chills, headache, sweats, rash, change in vision, change in hearing, chest pain, cough,  nausea or vomiting, urinary symptoms. All other systems are reviewed and are negative.  PHYSICAL EXAM   The patient's hair is short but growing back after her neurosurgery. She has a nicely healed scar along the right side of her head. She is oriented to person time and place. Affect is normal. Sclera and conjunctiva are normal. There is no jugular venous distention. Lungs are clear. Respiratory effort is nonlabored. Cardiac exam reveals S1 and S2. There are no clicks or significant murmurs. The abdomen is soft. There is no peripheral edema. The scar on her head is nicely healed.  Filed Vitals:   11/30/13 1502  BP: 162/103  Pulse: 102  Height: 5' 7.5" (1.715 m)  Weight: 136 lb (61.689 kg)     ASSESSMENT & PLAN

## 2013-11-30 NOTE — Assessment & Plan Note (Signed)
The patient needs to have her primary care followup. We are arranging for this. It is very important that she have someone overseeing her overall care.

## 2013-11-30 NOTE — Assessment & Plan Note (Signed)
The patient continues to have some falling spells. The history is not clear. She Sereno still need an implantable loop recorder. However I am hesitant to proceed with this without having further input from neurosurgery and neurology. I do feel it is appropriate to place a 21 day event monitor and this will be done. I will encourage followup with her primary team and neurosurgery and neurology. I will then see her back for cardiology followup.  As part of today's evaluation I spent greater than 25 minutes with her total care. More than half of this time is been with direct contact with her.

## 2013-11-30 NOTE — Assessment & Plan Note (Signed)
It is my understanding that patient had a subdural around the time that her meningioma was diagnosed. This has been treated.

## 2013-11-30 NOTE — Assessment & Plan Note (Signed)
Blood pressure is running on the higher side at this point. We do not know what it is running at home. There is question that she did have some orthostatic symptoms. Have chosen not to change her meds today.

## 2013-12-01 ENCOUNTER — Telehealth: Payer: Self-pay | Admitting: Cardiology

## 2013-12-01 ENCOUNTER — Encounter: Payer: Self-pay | Admitting: Family Medicine

## 2013-12-01 ENCOUNTER — Ambulatory Visit (INDEPENDENT_AMBULATORY_CARE_PROVIDER_SITE_OTHER): Payer: Medicare Other | Admitting: Family Medicine

## 2013-12-01 VITALS — BP 136/80 | HR 84 | Temp 97.8°F | Resp 18 | Ht 67.5 in | Wt 136.0 lb

## 2013-12-01 DIAGNOSIS — R5381 Other malaise: Secondary | ICD-10-CM

## 2013-12-01 DIAGNOSIS — E871 Hypo-osmolality and hyponatremia: Secondary | ICD-10-CM

## 2013-12-01 DIAGNOSIS — I6529 Occlusion and stenosis of unspecified carotid artery: Secondary | ICD-10-CM

## 2013-12-01 DIAGNOSIS — Z9181 History of falling: Secondary | ICD-10-CM | POA: Diagnosis not present

## 2013-12-01 DIAGNOSIS — R5383 Other fatigue: Secondary | ICD-10-CM

## 2013-12-01 DIAGNOSIS — D6489 Other specified anemias: Secondary | ICD-10-CM

## 2013-12-01 DIAGNOSIS — R531 Weakness: Secondary | ICD-10-CM

## 2013-12-01 NOTE — Telephone Encounter (Signed)
Follow up:    Pt returning call about appt with the [facility]

## 2013-12-01 NOTE — Telephone Encounter (Signed)
Spoke with patient who states she is trying to reach Dr. Harley Hallmark office.  I gave patient the phone number and she verbalized understanding and gratitude.

## 2013-12-01 NOTE — Telephone Encounter (Signed)
Follow up:    Pt stated she is returning RN call today about 55min ago.  Pt stated she call go today her Sister will take her.  Pt wants a call back today. Please.

## 2013-12-01 NOTE — Progress Notes (Signed)
Subjective:    Patient ID: [name], female    DOB: 1930-04-18, 80 y.o.   MRN: [id number]  Hypertension Pertinent negatives include no chest pain, headaches or shortness of breath.   Patient is seen for medical followup. Her current primary provider was not available. Recent history is that she had meningioma excision back in June. Prior to that time, she was having episodes of dizziness, unstable gait, and possible syncope. She saw a cardiologist yesterday and had elevated blood pressure 162/103. She states she has not had neurosurgical followup since her surgery. She got home from rehabilitation July 16.  Patient has had niece staying with her but as of today, niece is no longer available. They are somewhat desperate for home health assistance. She has had physical therapy and occupational therapy since discharge from rehabilitation. She is ambulating with a walker.  Chronic problems include remote history of breast cancer, type 2 diabetes, GERD, hyperlipidemia, hypertension, irritable bowel syndrome, osteoarthritis, osteoporosis, history of subdural hemorrhage  She actually had one subsequent fall following her surgery and on July 17 had repeat CT in hospital which did not show any acute findings.  She has history of hyponatremia with sodium 130 during hospitalization and anemia with hemoglobin 9.1. Most recent A1c 7.7%. Overall feels weak. Feels unbalanced and feels like physical therapy has not helped much. No fever .  No dysuria or cough.  Past Medical History  Diagnosis Date  . Seasonal allergies   . HTN (hypertension)   . Osteoarthritis   . Palpitations   . Anemia   . History of colonic polyps   . GERD (gastroesophageal reflux disease)   . Hyperlipidemia   . Chronic facial pain   . Vitamin B12 deficiency   . Dyslipidemia   . Chronic foot pain   . Chronic cough   . Obesity   . Anxiety   . Complication of anesthesia     Sore jaw; could not chew or move mouth  . Diabetes  mellitus     type 2 niddm x 20 years  . Metatarsal bone fracture 2014  . Hammer toe     bilateral  . Hx of radiation therapy 03/07/13- 03/29/13    right breast 4250 cGy 17 sessions  . Ejection fraction   . Skin cancer   . Melanoma   . 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 Colonoscopy    . Polypectomy      small adenomatous  . Abdominal hysterectomy    . Cholecystectomy    . Hernia repair    . Knee arthroscopy Bilateral   . Ulnar tunnel release    . Eye surgery    . Cataract extraction w/ intraocular lens implant Right   . Bunionectomy    . Breast lumpectomy with needle localization Right 01/17/2013    Procedure: BREAST LUMPECTOMY WITH NEEDLE LOCALIZATION;  Surgeon: Edward Jolly, MD;  Location: [facility];  Service: General;  Laterality: Right;  . Breast biopsy Right 01/24/2013    Procedure: RE-EXCICION OF BREAST CANCER, ANTERIOR MARGINS;  Surgeon: Edward Jolly, MD;  Location: [facility];  Service: General;  Laterality: Right;  . Craniotomy Right 10/18/2013    Procedure: CRANIOTOMY TUMOR EXCISION;  Surgeon: Floyce Stakes, MD;  Location: [facility];  Service: Neurosurgery;  Laterality: Right;    reports that she has never smoked. She does not have any smokeless  tobacco history on file. She reports that she does not drink alcohol or use illicit drugs. family history includes Bone cancer in her sister; Colon cancer in her brother and another family member; Diabetes in her father; Heart disease in her mother; Pancreatic cancer in her father; Prostate cancer in her brother; Rectal cancer in her sister; Thyroid disease in her sister. Allergies  Allergen Reactions  . Bystolic [Nebivolol Hcl] Other (See Comments)    "extreme weakness, heaviness in legs & arms, swelling in legs/arms/face, swollen abdomen, pain in bladder, feet pain, soreness in chest"  . Cholestyramine     "itching rash on stomach,  bloated, nausea, vomiting, sleeplessness, extreme pain in arms"  . Hydrazine Yellow [Tartrazine] Other (See Comments)    "does not reduce high blood pressure, pain in arm, high pressure, felt like I was on verge of heart attack, really weak"  . Morphine Other (See Comments)    Feels morbid, weak, still in pain  . Niacin Palpitations    Fast heart beat  . Niaspan [Niacin Er] Palpitations and Other (See Comments)    "fast heart beat, high blood pressure"  . Norvasc [Amlodipine Besylate] Other (See Comments)    "extreme fluid retention/pain)  . Optivar [Azelastine Hcl] Photosensitivity  . Sular [Nisoldipine Er] Other (See Comments)    "severe headaches, swelling eyes, hands, feet, shortness of breath, weak, flushed face, brain boiling, fluid retention, high blood sugar, nervous, heart fast beating"  . Tegretol [Carbamazepine] Other (See Comments)    Blood poisoning   . Telmisartan Other (See Comments)    "headache, difficulty urinating, high blood sugar, fluid retention"  . Cefdinir Swelling    Vaginal irritation, breathing,   . Clonidine Other (See Comments)    Dry mouth, fluid retention  . Clonidine Hydrochloride Other (See Comments)    Dry mouth, fluid retention  . Codeine Nausea And Vomiting  . Ezetimibe Other (See Comments)    Made weak  . Naproxen Other (See Comments)    Shrinks bladder  . Ziac [Bisoprolol-Hydrochlorothiazide] Other (See Comments)    "stopped urination"  . Ace Inhibitors Other (See Comments)    unknown  . Actonel [Risedronate Sodium] Other (See Comments)    unknown  . Amlodipine Besylate Other (See Comments)    unknown  . Aspirin Other (See Comments)    unknown  . Atacand [Candesartan] Other (See Comments)    unknown  . Bextra [Valdecoxib] Other (See Comments)    unknown  . Bisoprolol-Hydrochlorothiazide Other (See Comments)    unknown  . Candesartan Cilexetil Other (See Comments)    unknown  . Cefadroxil Other (See Comments)    unknown  .  Celecoxib Rash  . Hydrocodone Other (See Comments)    unknown  . Hydrocodone-Acetaminophen Other (See Comments)    unknown  . Iodinated Diagnostic Agents Rash    "All over"  . Meloxicam Other (See Comments)    unknown  . Methylprednisolone Sodium Succinate Other (See Comments)    unknown  . Nabumetone Other (See Comments)    unknown  . Penicillins Other (See Comments)    unknown  . Pseudoephedrine-Guaifenesin Other (See Comments)    unknown  . Risedronate Sodium Other (See Comments)    unknown  . Rofecoxib Other (See Comments)    unknown  . Ru-Tuss [Chlorphen-Pse-Atrop-Hyos-Scop] Other (See Comments)    unknown  . Sulfonamide Derivatives Other (See Comments)    unknown  . Sulphur [Sulfur] Other (See Comments)    unknown  . Telithromycin Other (See  Comments)    unknown  . Terfenadine Other (See Comments)    unknown  . Trandolapril-Verapamil Hcl Er Other (See Comments)    Headache, difficulty urinating, high blood sugar, fluid retention  . Valium [Diazepam] Other (See Comments)    Makes her mean and hyper      Review of Systems  Constitutional: Positive for fatigue.  HENT: Negative for trouble swallowing.   Respiratory: Negative for cough and shortness of breath.   Cardiovascular: Negative for chest pain.  Gastrointestinal: Negative for abdominal pain.  Genitourinary: Negative for dysuria.  Neurological: Positive for dizziness and weakness. Negative for headaches.  Psychiatric/Behavioral: Negative for confusion and agitation.       Objective:   Physical Exam  Constitutional: She is oriented to person, place, and time. She appears well-developed and well-nourished.  HENT:  Large scar right side of scalp from recent surgery. No erythema or edema.  Neck: Neck supple. No thyromegaly present.  Cardiovascular: Normal rate.  Exam reveals no gallop.   Pulmonary/Chest: Effort normal and breath sounds normal. No respiratory distress. She has no wheezes. She has no rales.   Musculoskeletal: She exhibits no edema.  Neurological: She is alert and oriented to person, place, and time. No cranial nerve deficit.  Psychiatric: She has a normal mood and affect. Her behavior is normal.          Assessment & Plan:  #1 recent meningioma excision right frontal brain. Set up referral back to neurosurgeon whom she has not seen since her surgery. #2 unstable gait with high risk for falls. She has had extensive home physical therapy and occupational therapy. Try to set up home health referral for further assistance Ambulate with walker at all times #3 history of hyponatremia. Recheck electrolytes #4 history of anemia. Recheck CBC #5 type 2 diabetes with marginal control. We'll defer further followup to new primary

## 2013-12-01 NOTE — Progress Notes (Signed)
Pre visit review using our clinic review tool, if applicable. No additional management support is needed unless otherwise documented below in the visit note. 

## 2013-12-02 LAB — BASIC METABOLIC PANEL
BUN: 12 mg/dL (ref 6–23)
CO2: 22 mEq/L (ref 19–32)
Calcium: 9 mg/dL (ref 8.4–10.5)
Chloride: 100 mEq/L (ref 96–112)
Creat: 0.78 mg/dL (ref 0.50–1.10)
Glucose, Bld: 92 mg/dL (ref 70–99)
Potassium: 4.4 mEq/L (ref 3.5–5.3)
Sodium: 134 mEq/L — ABNORMAL LOW (ref 135–145)

## 2013-12-02 LAB — CBC WITH DIFFERENTIAL/PLATELET
Basophils Absolute: 0 10*3/uL (ref 0.0–0.1)
Basophils Relative: 0 % (ref 0–1)
Eosinophils Absolute: 0.2 10*3/uL (ref 0.0–0.7)
Eosinophils Relative: 2 % (ref 0–5)
HCT: 36.5 % (ref 36.0–46.0)
Hemoglobin: 12.2 g/dL (ref 12.0–15.0)
Lymphocytes Relative: 29 % (ref 12–46)
Lymphs Abs: 2.6 10*3/uL (ref 0.7–4.0)
MCH: 29.3 pg (ref 26.0–34.0)
MCHC: 33.4 g/dL (ref 30.0–36.0)
MCV: 87.7 fL (ref 78.0–100.0)
Monocytes Absolute: 0.6 10*3/uL (ref 0.1–1.0)
Monocytes Relative: 7 % (ref 3–12)
Neutro Abs: 5.6 10*3/uL (ref 1.7–7.7)
Neutrophils Relative %: 62 % (ref 43–77)
Platelets: 359 10*3/uL (ref 150–400)
RBC: 4.16 MIL/uL (ref 3.87–5.11)
RDW: 14.8 % (ref 11.5–15.5)
WBC: 9.1 10*3/uL (ref 4.0–10.5)

## 2013-12-04 ENCOUNTER — Telehealth: Payer: Self-pay | Admitting: Internal Medicine

## 2013-12-04 DIAGNOSIS — D332 Benign neoplasm of brain, unspecified: Secondary | ICD-10-CM | POA: Diagnosis not present

## 2013-12-04 DIAGNOSIS — Z48811 Encounter for surgical aftercare following surgery on the nervous system: Secondary | ICD-10-CM | POA: Diagnosis not present

## 2013-12-04 NOTE — Telephone Encounter (Signed)
Don had already ended visit with pt, however I did notify him that Abby Potash was made aware of pt's heart rate

## 2013-12-04 NOTE — Telephone Encounter (Signed)
This is a duplicate.  Verbal order was already given

## 2013-12-04 NOTE — Telephone Encounter (Signed)
Per Timmothy Sours after feeding her cats pt's HR was 119 and 107 at end of visit.

## 2013-12-04 NOTE — Telephone Encounter (Signed)
[name], therapist with [name] calling to report pt's rapid heart rate.  Pt's heart rate is 116, resting.  Pt has not other complaints or symptoms.  Please return call to [facility] with treatment plan.

## 2013-12-04 NOTE — Telephone Encounter (Signed)
Recheck heart rate at rest in 30 min

## 2013-12-04 NOTE — Telephone Encounter (Signed)
Please advise 

## 2013-12-05 DIAGNOSIS — Z48811 Encounter for surgical aftercare following surgery on the nervous system: Secondary | ICD-10-CM | POA: Diagnosis not present

## 2013-12-05 DIAGNOSIS — D332 Benign neoplasm of brain, unspecified: Secondary | ICD-10-CM | POA: Diagnosis not present

## 2013-12-07 DIAGNOSIS — Z483 Aftercare following surgery for neoplasm: Secondary | ICD-10-CM | POA: Diagnosis not present

## 2013-12-07 DIAGNOSIS — I69919 Unspecified symptoms and signs involving cognitive functions following unspecified cerebrovascular disease: Secondary | ICD-10-CM | POA: Diagnosis not present

## 2013-12-07 DIAGNOSIS — R55 Syncope and collapse: Secondary | ICD-10-CM | POA: Diagnosis not present

## 2013-12-07 DIAGNOSIS — D332 Benign neoplasm of brain, unspecified: Secondary | ICD-10-CM | POA: Diagnosis not present

## 2013-12-07 DIAGNOSIS — Z48811 Encounter for surgical aftercare following surgery on the nervous system: Secondary | ICD-10-CM | POA: Diagnosis not present

## 2013-12-08 ENCOUNTER — Telehealth: Payer: Self-pay | Admitting: Internal Medicine

## 2013-12-08 DIAGNOSIS — D332 Benign neoplasm of brain, unspecified: Secondary | ICD-10-CM | POA: Diagnosis not present

## 2013-12-08 DIAGNOSIS — Z48811 Encounter for surgical aftercare following surgery on the nervous system: Secondary | ICD-10-CM | POA: Diagnosis not present

## 2013-12-08 NOTE — Telephone Encounter (Signed)
Pt reported 3 three days ago she stopped taking her rx keppra, reports that her tremors in her hands stopped, pt reports today she started back taking the keppra and the tremors are coming back and tremors were very severe 3 days ago. Pt would like to know if there is any other medication that she can take in place of the keppra.

## 2013-12-11 NOTE — Telephone Encounter (Signed)
Pt will need an appt with another provider, Dr. Arnoldo Morale is no longer here

## 2013-12-12 DIAGNOSIS — D332 Benign neoplasm of brain, unspecified: Secondary | ICD-10-CM | POA: Diagnosis not present

## 2013-12-12 DIAGNOSIS — Z48811 Encounter for surgical aftercare following surgery on the nervous system: Secondary | ICD-10-CM | POA: Diagnosis not present

## 2013-12-13 NOTE — Telephone Encounter (Signed)
Spoke with don at [facility] and he informed me to call pt directly, unable to reach pt, no vm set up

## 2013-12-14 ENCOUNTER — Ambulatory Visit: Payer: Medicare Other | Admitting: Family

## 2013-12-14 DIAGNOSIS — Z48811 Encounter for surgical aftercare following surgery on the nervous system: Secondary | ICD-10-CM | POA: Diagnosis not present

## 2013-12-14 DIAGNOSIS — D332 Benign neoplasm of brain, unspecified: Secondary | ICD-10-CM | POA: Diagnosis not present

## 2013-12-19 DIAGNOSIS — D332 Benign neoplasm of brain, unspecified: Secondary | ICD-10-CM | POA: Diagnosis not present

## 2013-12-19 DIAGNOSIS — Z48811 Encounter for surgical aftercare following surgery on the nervous system: Secondary | ICD-10-CM | POA: Diagnosis not present

## 2013-12-20 ENCOUNTER — Telehealth: Payer: Self-pay | Admitting: *Deleted

## 2013-12-20 ENCOUNTER — Encounter: Payer: Self-pay | Admitting: *Deleted

## 2013-12-20 ENCOUNTER — Encounter (INDEPENDENT_AMBULATORY_CARE_PROVIDER_SITE_OTHER): Payer: Medicare Other

## 2013-12-20 DIAGNOSIS — R55 Syncope and collapse: Secondary | ICD-10-CM

## 2013-12-20 NOTE — Progress Notes (Signed)
Patient ID: [name], female   DOB: [birth year], 80 y.o.   MRN: [id number] E-cardio verite 30 day cardiac event monitor applied to patient.

## 2013-12-20 NOTE — Progress Notes (Signed)
Patient ID: [name], female   DOB: 05-Jan-1930, 80 y.o.   MRN: [id number] Correction. E-Cardio verite 21 day cardiac event monitor applied to patient.

## 2013-12-20 NOTE — Telephone Encounter (Signed)
Received call from [place] at [employer] of critical reading. Call back number is [phone]. Per AJ they received activation from pt that she had syncopal episode at 12:16 Central Time. They have been unable to reach pt. Monitor at the time showed SR 90.  I tried to reach pt at home number but no answer. I called emergency contact listed for pt (her sister).  Sister's husband reports sister is at pt's home but pt is not there. Pt's car not at home. I asked that they have pt contact our office when they get in touch with her. I spoke with Joellen Jersey who put monitor on pt. Katie reports pt did accidentally push syncope button on monitor when in office. Pt also had home health nurse with her when here for monitor appt.  Joellen Jersey will contact [employer] to get reading of pt's current monitor report.  I tried again to reach pt but no answer.

## 2013-12-20 NOTE — Telephone Encounter (Signed)
[name] reviewed information from [employer] and only activation was when pt was in office. Readings show pt is SR.  Pt also returned call and reports she has not pushed button since leaving office. She is feeling fine.

## 2013-12-21 DIAGNOSIS — D320 Benign neoplasm of cerebral meninges: Secondary | ICD-10-CM | POA: Insufficient documentation

## 2013-12-21 DIAGNOSIS — G8929 Other chronic pain: Secondary | ICD-10-CM | POA: Insufficient documentation

## 2013-12-26 DIAGNOSIS — D332 Benign neoplasm of brain, unspecified: Secondary | ICD-10-CM | POA: Diagnosis not present

## 2013-12-26 DIAGNOSIS — Z48811 Encounter for surgical aftercare following surgery on the nervous system: Secondary | ICD-10-CM | POA: Diagnosis not present

## 2013-12-27 ENCOUNTER — Ambulatory Visit (INDEPENDENT_AMBULATORY_CARE_PROVIDER_SITE_OTHER): Payer: Medicare Other | Admitting: Family Medicine

## 2013-12-27 ENCOUNTER — Encounter: Payer: Self-pay | Admitting: Family Medicine

## 2013-12-27 ENCOUNTER — Telehealth: Payer: Self-pay | Admitting: Internal Medicine

## 2013-12-27 VITALS — BP 145/78 | HR 82 | Temp 97.3°F | Wt 138.0 lb

## 2013-12-27 DIAGNOSIS — R609 Edema, unspecified: Secondary | ICD-10-CM | POA: Diagnosis not present

## 2013-12-27 LAB — COMPREHENSIVE METABOLIC PANEL
ALT: 14 U/L (ref 0–35)
AST: 20 U/L (ref 0–37)
Albumin: 3.5 g/dL (ref 3.5–5.2)
Alkaline Phosphatase: 47 U/L (ref 39–117)
BUN: 11 mg/dL (ref 6–23)
CO2: 23 mEq/L (ref 19–32)
Calcium: 9.2 mg/dL (ref 8.4–10.5)
Chloride: 100 mEq/L (ref 96–112)
Creatinine, Ser: 0.7 mg/dL (ref 0.4–1.2)
GFR: 81.17 mL/min (ref >60.00)
Glucose, Bld: 130 mg/dL — ABNORMAL HIGH (ref 70–99)
Potassium: 4.5 mEq/L (ref 3.5–5.1)
Sodium: 132 mEq/L — ABNORMAL LOW (ref 135–145)
Total Bilirubin: 0.6 mg/dL (ref 0.2–1.2)
Total Protein: 6 g/dL (ref 6.0–8.3)

## 2013-12-27 LAB — CBC
HCT: 35.7 % — ABNORMAL LOW (ref 36.0–46.0)
Hemoglobin: 11.7 g/dL — ABNORMAL LOW (ref 12.0–15.0)
MCHC: 32.7 g/dL (ref 30.0–36.0)
MCV: 91.1 fl (ref 78.0–100.0)
Platelets: 349 10*3/uL (ref 150.0–400.0)
RBC: 3.92 Mil/uL (ref 3.87–5.11)
RDW: 15.6 % — ABNORMAL HIGH (ref 11.5–15.5)
WBC: 9.1 10*3/uL (ref 4.0–10.5)

## 2013-12-27 LAB — TSH: TSH: 2.59 u[IU]/mL (ref 0.35–4.50)

## 2013-12-27 MED ORDER — FUROSEMIDE 20 MG PO TABS: 20.0000 mg | ORAL_TABLET | Freq: Every day | ORAL | Status: DC

## 2013-12-27 NOTE — Progress Notes (Signed)
Yolanda Reddish, MD Phone: [phone]  Subjective:   [name] is a 80 y.o. year old very pleasant female patient who presents with the following:  Edema Swelling for about a week in bilateral legs. Seems to be slightly worse on left (no history of injury to the left leg other than bilateral knee surgery). Not sure of cause of swelling. Asks for fluid pill. Has some urinary hesitancy 2 weeks. Urinating smaller amount. No dysuria. No polyuria. States this has been a problem for her in the past and she has taking Lasix as needed. She refuses compression stockings and she does like how they appear. She has noted some puffiness below her eyes as well. ROS- no orthopnea, PND, shortness of breath or chest pain.   Past Medical History-benign brain tumor s/p removal 7 weeks ago, thyroid nodules, CAD, asthma, IBS, DM II, HTN, HLD, OA  Medications- reviewed and updated Current Outpatient Prescriptions  Medication Sig Dispense Refill  . acetaminophen (TYLENOL) 500 MG tablet Take 1,000 mg by mouth every 6 (six) hours as needed for pain.      Marland Kitchen ALPRAZolam (XANAX) 0.25 MG tablet Take 0.25 mg by mouth at bedtime.       . calcium carbonate (OS-CAL) 600 MG TABS Take 600 mg by mouth daily.       . cholecalciferol (VITAMIN D) 1000 UNITS tablet Take 1,000 Units by mouth daily.       . colestipol (COLESTID) 1 G tablet Take 2 g by mouth 2 (two) times daily.      . ferrous sulfate 325 (65 FE) MG tablet Take 325 mg by mouth daily with breakfast.       . fexofenadine (ALLEGRA) 60 MG tablet Take 60 mg by mouth daily.       . fish oil-omega-3 fatty acids 1000 MG capsule Take 1 g by mouth daily.       . ranitidine (ZANTAC) 150 MG capsule Take 150 mg by mouth daily as needed for heartburn.       . SitaGLIPtin-MetFORMIN HCl (JANUMET XR) [id number] MG TB24 Take 1 tablet by mouth daily.  90 tablet  3  . trandolapril-verapamil (TARKA) 2-180 MG per tablet Take 1 tablet by mouth daily.      Marland Kitchen lidocaine (LIDODERM) 5 % Place  1 patch onto the skin daily. Remove & Discard patch within 12 hours or as directed by MD  90 patch  1   Current Facility-Administered Medications  Medication Dose Route Frequency Provider Last Rate Last Dose  . cyanocobalamin ((VITAMIN B-12)) injection 1,000 mcg  1,000 mcg Intramuscular Q30 days Ricard Dillon, MD   1,000 mcg at 02/13/11 3825    Objective: BP 145/78  Pulse 82  Temp(Src) 97.3 F (36.3 C)  Wt 138 lb (62.596 kg) Gen: NAD, resting comfortably HEENT: slight puffiness under eyes, Mucous membranes are moist. CV: RRR no murmurs rubs or gallops, No JVD. 2+ pulses DP. Lungs: CTAB no crackles, wheeze, rhonchi Abdomen: soft/nontender/nondistended/normal bowel sounds.  Ext: trace edema bilaterally, no calf tenderness  Assessment/Plan:  Edema Minimal on exam with only trace amount. No JVD and also without CHF symptoms other than edema. She does have grade 1 diastolic dysfunction. I highly suspect this is venous insufficiency but patient refuses compression stockings. Check basic labs-CMET, CBC, UA, Urine culture. Did not check BNP as thought unlikely. I do have some renal concerns given puffiness around eyes but honestly this could be a chronic issue that patient is just more aware of the  due to edema in legs.  I encouraged compression stockings but patient refused. She does have some hyponatremia in the past so I discussed would like to avoid Lasix but patient adamantly she needs this and requests 90 day supply. We compromised on a 30 day supply to just use 10 pills and see how swelling does. I did counsel patient about CHF risks and things to look out for given history of diastolic dysfunction.  Orders Placed This Encounter  Procedures  . Comprehensive metabolic panel    [place]  . CBC    [place]  . TSH      . POCT urinalysis dipstick    In house   Meds ordered this encounter  Medications  . furosemide (LASIX) 20 MG tablet    Sig: Take 1 tablet (20 mg total) by  mouth daily. Do not take until I call you about labs.    Dispense:  30 tablet    Refill:  0

## 2013-12-27 NOTE — Telephone Encounter (Signed)
appt today with Dr Yong Channel at 1:30

## 2013-12-27 NOTE — Telephone Encounter (Signed)
Patient Information:  Caller Name: [place]  Phone: [phone]  Patient: [name], [name]  Gender: Female  DOB: 11/24/1929  Age: 80 Years  PCP: Benay Pillow (Adults only, leaving end of July 2015)  Office Follow Up:  Does the office need to follow up with this patient?: No  Instructions For The Office: N/A  RN Note:  Afebrile. LMP unk. Post op Brain surgery in the beginning of June 2015 and was seen for 6 week follow up. Onset of ankle edema noticable yesterday, 12/26/2013, urination decreased, abdomen feels distended, feels worn out since surgery (in house therapy ended yesterday, 12/26/2013). She also states her BP is high, 150'[name]/101. She is presently wearing a cardiac halter monitor for the last week, neurosugeon put on. She has tried calling her surgeon and neurologist and no calls back. Per [facility] guidelline: All emergent signs and symptoms of Edema, Atraumatic protocol ruled out except 'New or worsening fatigue, weakness, confusion or change in function. Home Care advice given, FALL Precautions advised and scheduled appointment for today, 12/27/2013 @ 1:30 with Dr. Yong Channel  Symptoms  Reason For Call & Symptoms: She has new bilateral ankle edema  Reviewed Health History In EMR: Yes  Reviewed Medications In EMR: Yes  Reviewed Allergies In EMR: Yes  Reviewed Surgeries / Procedures: Yes  Date of Onset of Symptoms: 12/25/2013  Guideline([name]) Used:  No Protocol Available - Sick Adult  Disposition Per Guideline:   Go to Office Now  Reason For Disposition Reached:   Nursing judgment  Advice Given:  N/A  RN Overrode Recommendation:  Follow Up With Office Later  She states her Blood Sugar was "30" yesterday and needs to eat now before she goes to office. RN/CAN made appointment for today @ 13:30, she agreed to be driven after she eats.

## 2013-12-27 NOTE — Patient Instructions (Signed)
If your labs are ok, you can take 10 days of lasix and keep the other 20 pills in case this occurs again.   I think this is either from venous insufficiency or the fact your heart does not relax well. I do not want to prescribe chronic daily lasix long term due to some electrolyte abnormalities in the past. Please monitor your weight daily and if increases 5 lbs in 3 days or 3 lbs in a day, please let us know. If you have shortness of breath or chest pain, we need to know about that ASAP.   Thanks, Dr. Yong Channel  P.S. As discussed, I do not prescribe xanax for sleep (if you choose to establish with me)

## 2013-12-28 ENCOUNTER — Other Ambulatory Visit: Payer: Medicare Other

## 2013-12-28 ENCOUNTER — Telehealth: Payer: Self-pay | Admitting: Internal Medicine

## 2013-12-28 LAB — URINALYSIS
Bilirubin Urine: NEGATIVE
Hgb urine dipstick: NEGATIVE
Ketones, ur: NEGATIVE
Leukocytes, UA: NEGATIVE
Nitrite: NEGATIVE
Specific Gravity, Urine: <=1.005 — AB (ref 1.000–1.030)
Total Protein, Urine: NEGATIVE
Urine Glucose: NEGATIVE
Urobilinogen, UA: 0.2 (ref 0.0–1.0)
pH: 5.5 (ref 5.0–8.0)

## 2013-12-28 NOTE — Telephone Encounter (Signed)
[employer] would like additional information about a form dr Arnoldo Morale sent in /signed and dated 8/24.  Pt states she has brain tumor and brain bleeding but none of that is mentioned on the form. They would like a cb Did you send?

## 2013-12-28 NOTE — Addendum Note (Signed)
Addended by: Elmer Picker on: 12/28/2013 10:36 AM   Modules accepted: Orders

## 2014-01-03 ENCOUNTER — Telehealth: Payer: Self-pay | Admitting: Internal Medicine

## 2014-01-03 NOTE — Telephone Encounter (Signed)
Caller: [name]/Patient; Phone: [phone]; Reason for Call: Called with insurance issue.  [place] sent letter to Dr [name] but has not received a reply.  The insurance requires the MD response before insurance claim will be paid.  Hospitalized at [facility] in June 2015 for brain tumor.  Had MRI and surgery approximately 10/18/13.  Then, was in [facility] for rehab for 3 weeks.    RN contacted [name]/office who transferred to [facility] in billing.Pt became upset at multiple transfers and telephone prompts and decided to hang up, if someone will call her back. Pt information relayed to [name] at offsite billing office who said they don't handle this; the office needs to follow up.   Office: Please call [name] back ASAP to advise about the status of Dr [name] response for the insurance issue.

## 2014-01-05 ENCOUNTER — Encounter: Payer: Self-pay | Admitting: Cardiology

## 2014-01-05 ENCOUNTER — Ambulatory Visit (INDEPENDENT_AMBULATORY_CARE_PROVIDER_SITE_OTHER): Payer: Medicare Other | Admitting: Cardiology

## 2014-01-05 VITALS — BP 130/82 | HR 76 | Ht 67.5 in | Wt 129.0 lb

## 2014-01-05 DIAGNOSIS — I6529 Occlusion and stenosis of unspecified carotid artery: Secondary | ICD-10-CM | POA: Diagnosis not present

## 2014-01-05 DIAGNOSIS — I6200 Nontraumatic subdural hemorrhage, unspecified: Secondary | ICD-10-CM | POA: Diagnosis not present

## 2014-01-05 DIAGNOSIS — R609 Edema, unspecified: Secondary | ICD-10-CM

## 2014-01-05 DIAGNOSIS — R55 Syncope and collapse: Secondary | ICD-10-CM | POA: Diagnosis not present

## 2014-01-05 NOTE — Assessment & Plan Note (Signed)
The patient's rhythm is stable. She's not had any recurrent syncope. She's worn an  event recorder for 15 days. She has not had any symptoms. There've been no significant arrhythmias. There was confusion early on that she [place] have had a passing out spell. This did not occur. No further workup at this time.

## 2014-01-05 NOTE — Assessment & Plan Note (Signed)
The patient complains of edema. However there is no evidence of volume overload. I have told her family that we should not use a diuretic. This could lead to problems in syncope.

## 2014-01-05 NOTE — Progress Notes (Signed)
Patient ID: [name], female   DOB: [birth year], 80 y.o.   MRN: [id number]    HPI  Patient is seen to followup a recent history of syncope. I saw the patient in the office November 30, 2013. There was an extensive review of her overall status. I made a decision to place an event recorder. She has worn this since August 19, 15 days. She has sinus rhythm. There has been some confusion about whether she actually had a passing out spell while wearing the monitor. She did not have any significant spells. There has been no syncope and no arrhythmias. She is tired of wearing it and wants it off today.  In addition she had slight edema and saw Dr. Yong Channel of primary care. Very appropriately it was noted that she did not have significant edema and a diuretic was not started. She does not have signs of congestive heart failure. Leg elevation and support hose is recommended for probable venous insufficiency.  Allergies  Allergen Reactions  . Bystolic [Nebivolol Hcl] Other (See Comments)    "extreme weakness, heaviness in legs & arms, swelling in legs/arms/face, swollen abdomen, pain in bladder, feet pain, soreness in chest"  . Cholestyramine     "itching rash on stomach, bloated, nausea, vomiting, sleeplessness, extreme pain in arms"  . Hydrazine Yellow [Tartrazine] Other (See Comments)    "does not reduce high blood pressure, pain in arm, high pressure, felt like I was on verge of heart attack, really weak"  . Morphine Other (See Comments)    Feels morbid, weak, still in pain  . Niacin Palpitations    Fast heart beat  . Niaspan [Niacin Er] Palpitations and Other (See Comments)    "fast heart beat, high blood pressure"  . Norvasc [Amlodipine Besylate] Other (See Comments)    "extreme fluid retention/pain)  . Optivar [Azelastine Hcl] Photosensitivity  . Sular [Nisoldipine Er] Other (See Comments)    "severe headaches, swelling eyes, hands, feet, shortness of breath, weak, flushed face, brain boiling, fluid  retention, high blood sugar, nervous, heart fast beating"  . Tegretol [Carbamazepine] Other (See Comments)    Blood poisoning   . Telmisartan Other (See Comments)    "headache, difficulty urinating, high blood sugar, fluid retention"  . Cefdinir Swelling    Vaginal irritation, breathing,   . Clonidine Other (See Comments)    Dry mouth, fluid retention  . Clonidine Hydrochloride Other (See Comments)    Dry mouth, fluid retention  . Codeine Nausea And Vomiting  . Ezetimibe Other (See Comments)    Made weak  . Naproxen Other (See Comments)    Shrinks bladder  . Ziac [Bisoprolol-Hydrochlorothiazide] Other (See Comments)    "stopped urination"  . Keppra [Levetiracetam]     Shaking  . Ace Inhibitors Other (See Comments)    unknown  . Actonel [Risedronate Sodium] Other (See Comments)    unknown  . Amlodipine Besylate Other (See Comments)    unknown  . Aspirin Other (See Comments)    unknown  . Atacand [Candesartan] Other (See Comments)    unknown  . Bextra [Valdecoxib] Other (See Comments)    unknown  . Bisoprolol-Hydrochlorothiazide Other (See Comments)    unknown  . Candesartan Cilexetil Other (See Comments)    unknown  . Cefadroxil Other (See Comments)    unknown  . Celecoxib Rash  . Hydrocodone Other (See Comments)    unknown  . Hydrocodone-Acetaminophen Other (See Comments)    unknown  . Iodinated Diagnostic Agents Rash    "  All over"  . Meloxicam Other (See Comments)    unknown  . Methylprednisolone Sodium Succinate Other (See Comments)    unknown  . Nabumetone Other (See Comments)    unknown  . Penicillins Other (See Comments)    unknown  . Pseudoephedrine-Guaifenesin Other (See Comments)    unknown  . Risedronate Sodium Other (See Comments)    unknown  . Rofecoxib Other (See Comments)    unknown  . Ru-Tuss [Chlorphen-Pse-Atrop-Hyos-Scop] Other (See Comments)    unknown  . Sulfonamide Derivatives Other (See Comments)    unknown  . Sulphur [Sulfur] Other  (See Comments)    unknown  . Telithromycin Other (See Comments)    unknown  . Terfenadine Other (See Comments)    unknown  . Trandolapril-Verapamil Hcl Er Other (See Comments)    Headache, difficulty urinating, high blood sugar, fluid retention  . Valium [Diazepam] Other (See Comments)    Makes her mean and hyper    Current Outpatient Prescriptions  Medication Sig Dispense Refill  . acetaminophen (TYLENOL) 500 MG tablet Take 1,000 mg by mouth every 6 (six) hours as needed for pain.      Marland Kitchen ALPRAZolam (XANAX) 0.25 MG tablet Take 0.25 mg by mouth at bedtime.       . calcium carbonate (OS-CAL) 600 MG TABS Take 600 mg by mouth daily.       . cholecalciferol (VITAMIN D) 1000 UNITS tablet Take 1,000 Units by mouth daily.       . colestipol (COLESTID) 1 G tablet Take 2 g by mouth 2 (two) times daily.      . ferrous sulfate 325 (65 FE) MG tablet Take 325 mg by mouth daily with breakfast.       . fexofenadine (ALLEGRA) 60 MG tablet Take 60 mg by mouth daily.       . fish oil-omega-3 fatty acids 1000 MG capsule Take 1 g by mouth daily.       . furosemide (LASIX) 20 MG tablet Take 1 tablet (20 mg total) by mouth daily. Do not take until I call you about labs.  30 tablet  0  . lidocaine (LIDODERM) 5 % Place 1 patch onto the skin daily. Remove & Discard patch within 12 hours or as directed by MD  90 patch  1  . ranitidine (ZANTAC) 150 MG capsule Take 150 mg by mouth daily as needed for heartburn.       . SitaGLIPtin-MetFORMIN HCl (JANUMET XR) 302 201 8947 MG TB24 Take 1 tablet by mouth daily.  90 tablet  3  . trandolapril-verapamil (TARKA) 2-180 MG per tablet Take 1 tablet by mouth daily.       Current Facility-Administered Medications  Medication Dose Route Frequency Provider Last Rate Last Dose  . cyanocobalamin ((VITAMIN B-12)) injection 1,000 mcg  1,000 mcg Intramuscular Q30 days Ricard Dillon, MD   1,000 mcg at 02/13/11 9628    History   Social History  . Marital Status: Widowed    Spouse  Name: N/A    Number of Children: 0  . Years of Education: N/A   Occupational History  . retired    Social History Main Topics  . Smoking status: Never Smoker   . Smokeless tobacco: Not on file  . Alcohol Use: No  . Drug Use: No  . Sexual Activity: Yes     Comment: menarche age 64, hysterectomy age 82, HRT x 2-3 mos, G1- miscarriage   Other Topics Concern  . Not on file  Social History Narrative   Widowed, lives alone.  Ambulates independently.     Family History  Problem Relation Age of Onset  . Colon cancer    . Heart disease Mother   . Diabetes Father   . Pancreatic cancer Father   . Bone cancer Sister   . Prostate cancer Brother   . Colon cancer Brother   . Rectal cancer Sister   . Thyroid disease Sister     benign goiter resected    Past Medical History  Diagnosis Date  . Seasonal allergies   . HTN (hypertension)   . Osteoarthritis   . Palpitations   . Anemia   . History of colonic polyps   . GERD (gastroesophageal reflux disease)   . Hyperlipidemia   . Chronic facial pain   . Vitamin B12 deficiency   . Dyslipidemia   . Chronic foot pain   . Chronic cough   . Obesity   . Anxiety   . Complication of anesthesia     Sore jaw; could not chew or move mouth  . Diabetes mellitus     type 2 niddm x 20 years  . Metatarsal bone fracture 2014  . Hammer toe     bilateral  . Hx of radiation therapy 03/07/13- 03/29/13    right breast 4250 cGy 17 sessions  . Ejection fraction   . Skin cancer   . Melanoma   . 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 Colonoscopy    . Polypectomy      small adenomatous  . Abdominal hysterectomy    . Cholecystectomy    . Hernia repair    . Knee arthroscopy Bilateral   . Ulnar tunnel release    . Eye surgery    . Cataract extraction w/ intraocular lens implant Right   . Bunionectomy    . Breast lumpectomy with needle localization Right  01/17/2013    Procedure: BREAST LUMPECTOMY WITH NEEDLE LOCALIZATION;  Surgeon: Edward Jolly, MD;  Location: [facility];  Service: General;  Laterality: Right;  . Breast biopsy Right 01/24/2013    Procedure: RE-EXCICION OF BREAST CANCER, ANTERIOR MARGINS;  Surgeon: Edward Jolly, MD;  Location: [facility];  Service: General;  Laterality: Right;  . Craniotomy Right 10/18/2013    Procedure: CRANIOTOMY TUMOR EXCISION;  Surgeon: Floyce Stakes, MD;  Location: [facility];  Service: Neurosurgery;  Laterality: Right;    Patient Active Problem List   Diagnosis Date Noted  . Brain tumor (benign) 10/18/2013  . Right frontal lobe mass 10/09/2013  . Subdural hemorrhage 10/09/2013  . Anxiety 10/09/2013  . Compression fracture of T12 vertebra 10/09/2013  . Ejection fraction   . Multiple thyroid nodules 09/05/2013  . Syncope 08/27/2013  . Carotid artery disease 08/27/2013  . Abnormal thyroid ultrasound 08/27/2013  . Cancer of right breast   . Cancer of central portion of female breast 12/30/2012  . OSTEOPOROSIS 03/06/2010  . TENSION TYPE HEADACHE UNSPECIFIED 07/11/2009  . ASTHMA 03/05/2009  . ACUTE MAXILLARY SINUSITIS 08/16/2008  . EUSTACHIAN TUBE DYSFUNCTION, RIGHT 03/21/2008  . IRRITABLE BOWEL SYNDROME  diarrhea type 03/21/2008  . COUGH 03/06/2008  . ANEMIA-NOS 12/16/2006  . GERD 12/16/2006  . COLONIC POLYPS, HX OF 12/16/2006  . DM w/o Complication Type II 29/52/8413  . HYPERLIPIDEMIA 10/29/2006  . HYPERTENSION 10/29/2006  . Allergic rhinitis, cause unspecified 10/29/2006  . OSTEOARTHRITIS 10/29/2006  .  HEADACHE 10/29/2006    ROS   Patient denies fever, chills, headache, sweats, rash, change in vision, change in hearing, chest pain, cough, nausea vomiting, urinary symptoms. All other systems are reviewed and are negative.  PHYSICAL EXAM  Patient is oriented to person time and place. Affect is normal. She continues to recover from her head surgery. There is no jugulovenous  distention. Lungs are clear. Respiratory effort is nonlabored. Cardiac exam reveals S1 and S2. The rhythm is regular. Abdomen is soft. There is no significant peripheral edema.  Filed Vitals:   01/05/14 1339  BP: 130/82  Pulse: 76  Height: 5' 7.5" (1.715 m)  Weight: 129 lb (58.514 kg)      ASSESSMENT & PLAN

## 2014-01-05 NOTE — Patient Instructions (Signed)
**Note De-identified  Obfuscation** Your physician recommends that you continue on your current medications as directed. Please refer to the Current Medication list given to you today.  Your physician wants you to follow-up in: 6 months. You will receive a reminder letter in the mail two months in advance. If you don't receive a letter, please call our office to schedule the follow-up appointment.  

## 2014-01-05 NOTE — Assessment & Plan Note (Signed)
This occurred around the time that the patient had a diagnosis of meningioma that was treated. She continues to recover.

## 2014-01-10 ENCOUNTER — Other Ambulatory Visit: Payer: Self-pay | Admitting: Internal Medicine

## 2014-01-11 NOTE — Telephone Encounter (Signed)
This was faxed

## 2014-01-25 ENCOUNTER — Telehealth: Payer: Self-pay

## 2014-01-25 NOTE — Telephone Encounter (Signed)
The pt is advised, Per Dr Ron Parker, that her Event monitor results showed no arrythmia's, syncope, pauses, brady or tachycardia. The pt verbalized understanding.

## 2014-01-26 ENCOUNTER — Ambulatory Visit (INDEPENDENT_AMBULATORY_CARE_PROVIDER_SITE_OTHER): Payer: Medicare Other | Admitting: Endocrinology

## 2014-01-26 ENCOUNTER — Encounter: Payer: Self-pay | Admitting: Endocrinology

## 2014-01-26 VITALS — BP 136/80 | HR 115 | Temp 97.9°F | Ht 67.5 in | Wt 132.0 lb

## 2014-01-26 DIAGNOSIS — E119 Type 2 diabetes mellitus without complications: Secondary | ICD-10-CM

## 2014-01-26 DIAGNOSIS — I6529 Occlusion and stenosis of unspecified carotid artery: Secondary | ICD-10-CM

## 2014-01-26 LAB — HEMOGLOBIN A1C: Hgb A1c MFr Bld: 7 % — ABNORMAL HIGH (ref 4.6–6.5)

## 2014-01-26 MED ORDER — NATEGLINIDE 60 MG PO TABS: 60.0000 mg | ORAL_TABLET | Freq: Three times a day (TID) | ORAL | Status: DC

## 2014-01-26 MED ORDER — METFORMIN HCL ER 500 MG PO TB24: ORAL_TABLET | ORAL | Status: DC

## 2014-01-26 NOTE — Patient Instructions (Addendum)
Please call Dr Arnoldo Morale' office at [place], and request a new regular doctor.   good diet and exercise habits significanly improve the control of your diabetes.  please let me know if you wish to be referred to a dietician.  high blood sugar is very risky to your health.  you should see an eye doctor and dentist every year.  You are at higher than average risk for pneumonia and hepatitis-B.  You should be vaccinated against both.   controlling your blood pressure and cholesterol drastically reduces the damage diabetes does to your body.  this also applies to quitting smoking.  please discuss these with your doctor.  check your blood sugar once a day.  vary the time of day when you check, between before the 3 meals, and at bedtime.  also check if you have symptoms of your blood sugar being too high or too low.  please keep a record of the readings and bring it to your next appointment here.  You can write it on any piece of paper.  please call us sooner if your blood sugar goes below 70, or if you have a lot of readings over 200. Please see a foot specialist.  you will receive a phone call, about a day and time for an appointment. i have changed the janumet to just metformin blood tests are being requested for you today.  We'll contact you with results. Based on the results, we'll know if we need to add-back the starlix.   Please come back for a follow-up appointment in January.

## 2014-01-26 NOTE — Progress Notes (Signed)
Subjective:    Patient ID: [name], female    DOB: [birth year], 80 y.o.   MRN: [id number]  HPI pt states DM was dx'ed in 1995; he has mild neuropathy of the lower extremities; he is unaware of any associated chronic complications; she took insulin for only a brief time after dx; pt says her diet and exercise is limits by health problems; he has never had GDM, pancreatitis, severe hypoglycemia or DKA.  She wants to d/c [place], due to fear of cancer. Past Medical History  Diagnosis Date  . Seasonal allergies   . HTN (hypertension)   . Osteoarthritis   . Palpitations   . Anemia   . History of colonic polyps   . GERD (gastroesophageal reflux disease)   . Hyperlipidemia   . Chronic facial pain   . Vitamin B12 deficiency   . Dyslipidemia   . Chronic foot pain   . Chronic cough   . Obesity   . Anxiety   . Complication of anesthesia     Sore jaw; could not chew or move mouth  . Diabetes mellitus     type 2 niddm x 20 years  . Metatarsal bone fracture 2014  . Hammer toe     bilateral  . Hx of radiation therapy 03/07/13- 03/29/13    right breast 4250 cGy 17 sessions  . Ejection fraction   . Skin cancer   . Melanoma   . 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 Colonoscopy    . Polypectomy      small adenomatous  . Abdominal hysterectomy    . Cholecystectomy    . Hernia repair    . Knee arthroscopy Bilateral   . Ulnar tunnel release    . Eye surgery    . Cataract extraction w/ intraocular lens implant Right   . Bunionectomy    . Breast lumpectomy with needle localization Right 01/17/2013    Procedure: BREAST LUMPECTOMY WITH NEEDLE LOCALIZATION;  Surgeon: Edward Jolly, MD;  Location: [facility];  Service: General;  Laterality: Right;  . Breast biopsy Right 01/24/2013    Procedure: RE-EXCICION OF BREAST CANCER, ANTERIOR MARGINS;  Surgeon: Edward Jolly, MD;  Location: [facility];  Service:  General;  Laterality: Right;  . Craniotomy Right 10/18/2013    Procedure: CRANIOTOMY TUMOR EXCISION;  Surgeon: Floyce Stakes, MD;  Location: [facility];  Service: Neurosurgery;  Laterality: Right;    History   Social History  . Marital Status: Widowed    Spouse Name: N/A    Number of Children: 0  . Years of Education: N/A   Occupational History  . retired    Social History Main Topics  . Smoking status: Never Smoker   . Smokeless tobacco: Not on file  . Alcohol Use: No  . Drug Use: No  . Sexual Activity: Yes     Comment: menarche age 75, hysterectomy age 62, HRT x 2-3 mos, G1- miscarriage   Other Topics Concern  . Not on file   Social History Narrative   Widowed, lives alone.  Ambulates independently.     Current Outpatient Prescriptions on File Prior to Visit  Medication Sig Dispense Refill  . acetaminophen (TYLENOL) 500 MG tablet Take 1,000 mg by mouth every 6 (six) hours as needed for pain.      Marland Kitchen ALPRAZolam (XANAX) 0.25 MG tablet Take 0.25 mg  by mouth at bedtime.       . calcium carbonate (OS-CAL) 600 MG TABS Take 600 mg by mouth daily.       . cholecalciferol (VITAMIN D) 1000 UNITS tablet Take 1,000 Units by mouth daily.       . colestipol (COLESTID) 1 G tablet Take 2 g by mouth 2 (two) times daily.      . ferrous sulfate 325 (65 FE) MG tablet Take 325 mg by mouth daily with breakfast.       . fexofenadine (ALLEGRA) 60 MG tablet Take 60 mg by mouth daily.       . fish oil-omega-3 fatty acids 1000 MG capsule Take 1 g by mouth daily.       . furosemide (LASIX) 20 MG tablet Take 1 tablet (20 mg total) by mouth daily. Do not take until I call you about labs.  30 tablet  0  . lidocaine (LIDODERM) 5 % Place 1 patch onto the skin daily. Remove & Discard patch within 12 hours or as directed by MD  90 patch  1  . ranitidine (ZANTAC) 150 MG capsule Take 150 mg by mouth daily as needed for heartburn.       . trandolapril-verapamil (TARKA) 2-180 MG per tablet Take 1 tablet by  mouth daily.       Current Facility-Administered Medications on File Prior to Visit  Medication Dose Route Frequency Provider Last Rate Last Dose  . cyanocobalamin ((VITAMIN B-12)) injection 1,000 mcg  1,000 mcg Intramuscular Q30 days Ricard Dillon, MD   1,000 mcg at 02/13/11 8657    Allergies  Allergen Reactions  . Bystolic [Nebivolol Hcl] Other (See Comments)    "extreme weakness, heaviness in legs & arms, swelling in legs/arms/face, swollen abdomen, pain in bladder, feet pain, soreness in chest"  . Cholestyramine     "itching rash on stomach, bloated, nausea, vomiting, sleeplessness, extreme pain in arms"  . Hydrazine Yellow [Tartrazine] Other (See Comments)    "does not reduce high blood pressure, pain in arm, high pressure, felt like I was on verge of heart attack, really weak"  . Morphine Other (See Comments)    Feels morbid, weak, still in pain  . Niacin Palpitations    Fast heart beat  . Niaspan [Niacin Er] Palpitations and Other (See Comments)    "fast heart beat, high blood pressure"  . Norvasc [Amlodipine Besylate] Other (See Comments)    "extreme fluid retention/pain)  . Optivar [Azelastine Hcl] Photosensitivity  . Sular [Nisoldipine Er] Other (See Comments)    "severe headaches, swelling eyes, hands, feet, shortness of breath, weak, flushed face, brain boiling, fluid retention, high blood sugar, nervous, heart fast beating"  . Tegretol [Carbamazepine] Other (See Comments)    Blood poisoning   . Telmisartan Other (See Comments)    "headache, difficulty urinating, high blood sugar, fluid retention"  . Cefdinir Swelling    Vaginal irritation, breathing,   . Clonidine Other (See Comments)    Dry mouth, fluid retention  . Clonidine Hydrochloride Other (See Comments)    Dry mouth, fluid retention  . Codeine Nausea And Vomiting  . Ezetimibe Other (See Comments)    Made weak  . Naproxen Other (See Comments)    Shrinks bladder  . Ziac [Bisoprolol-Hydrochlorothiazide]  Other (See Comments)    "stopped urination"  . Keppra [Levetiracetam]     Shaking  . Ace Inhibitors Other (See Comments)    unknown  . Actonel [Risedronate Sodium] Other (See Comments)  unknown  . Amlodipine Besylate Other (See Comments)    unknown  . Aspirin Other (See Comments)    unknown  . Atacand [Candesartan] Other (See Comments)    unknown  . Bextra [Valdecoxib] Other (See Comments)    unknown  . Bisoprolol-Hydrochlorothiazide Other (See Comments)    unknown  . Candesartan Cilexetil Other (See Comments)    unknown  . Cefadroxil Other (See Comments)    unknown  . Celecoxib Rash  . Hydrocodone Other (See Comments)    unknown  . Hydrocodone-Acetaminophen Other (See Comments)    unknown  . Iodinated Diagnostic Agents Rash    "All over"  . Meloxicam Other (See Comments)    unknown  . Methylprednisolone Sodium Succinate Other (See Comments)    unknown  . Nabumetone Other (See Comments)    unknown  . Penicillins Other (See Comments)    unknown  . Pseudoephedrine-Guaifenesin Other (See Comments)    unknown  . Risedronate Sodium Other (See Comments)    unknown  . Rofecoxib Other (See Comments)    unknown  . Ru-Tuss [Chlorphen-Pse-Atrop-Hyos-Scop] Other (See Comments)    unknown  . Sulfonamide Derivatives Other (See Comments)    unknown  . Sulphur [Sulfur] Other (See Comments)    unknown  . Telithromycin Other (See Comments)    unknown  . Terfenadine Other (See Comments)    unknown  . Trandolapril-Verapamil Hcl Er Other (See Comments)    Headache, difficulty urinating, high blood sugar, fluid retention  . Valium [Diazepam] Other (See Comments)    Makes her mean and hyper    Family History  Problem Relation Age of Onset  . Colon cancer    . Heart disease Mother   . Diabetes Father   . Pancreatic cancer Father   . Bone cancer Sister   . Prostate cancer Brother   . Colon cancer Brother   . Rectal cancer Sister   . Thyroid disease Sister     benign  goiter resected    BP 136/80  Pulse 115  Temp(Src) 97.9 F (36.6 C) (Oral)  Ht 5' 7.5" (1.715 m)  Wt 132 lb (59.875 kg)  BMI 20.36 kg/m2  SpO2 95%   Review of Systems denies weight loss, headache, chest pain, sob, n/v, urinary frequency, muscle cramps, excessive diaphoresis, depression, rhinorrhea, and easy bruising.  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  She has cold intolerance    Objective:   Physical Exam VS: see vs page GEN: no distress HEAD: head: no deformity eyes: no periorbital swelling, no proptosis external nose and ears are normal mouth: no lesion seen NECK: supple, thyroid is not enlarged CHEST WALL: no deformity LUNGS:  Clear to auscultation CV: reg rate and rhythm, no murmur ABD: abdomen is soft, nontender.  no hepatosplenomegaly.  not distended.  no hernia.  MUSCULOSKELETAL: muscle bulk and strength are grossly normal.  no obvious joint swelling.  gait is normal and steady EXTEMITIES: no deformity, except for hammer toes.  no ulcer on the feet.  feet are of normal color and temp.  no edema. PULSES: dorsalis pedis intact bilat.  no carotid bruit.   NEURO:  cn 2-12 grossly intact.   readily moves all 4's.  sensation is intact to touch on the feet SKIN:  Normal texture and temperature.  No rash or suspicious lesion is visible.  heavy callus at the left foot NODES:  None palpable at the neck PSYCH: alert, well-oriented.  Does not appear anxious nor depressed.  Lab Results  Component Value Date   HGBA1C 7.7* 10/12/2013   i reviewed electrocardiogram (10/10/13)  i have reviewed the following outside records: Office notes     Assessment & Plan:  DM: mild exacerbation. Foot callus, new.   Patient is advised the following: Patient Instructions  Please call Dr Arnoldo Morale' office at [place], and request a new regular doctor.   good diet and exercise habits significanly improve the control of your diabetes.  please let me know if you wish to be  referred to a dietician.  high blood sugar is very risky to your health.  you should see an eye doctor and dentist every year.  You are at higher than average risk for pneumonia and hepatitis-B.  You should be vaccinated against both.   controlling your blood pressure and cholesterol drastically reduces the damage diabetes does to your body.  this also applies to quitting smoking.  please discuss these with your doctor.  check your blood sugar once a day.  vary the time of day when you check, between before the 3 meals, and at bedtime.  also check if you have symptoms of your blood sugar being too high or too low.  please keep a record of the readings and bring it to your next appointment here.  You can write it on any piece of paper.  please call us sooner if your blood sugar goes below 70, or if you have a lot of readings over 200. Please see a foot specialist.  you will receive a phone call, about a day and time for an appointment. i have changed the janumet to just metformin blood tests are being requested for you today.  We'll contact you with results. Based on the results, we'll know if we need to add-back the starlix.   Please come back for a follow-up appointment in January.

## 2014-01-30 DIAGNOSIS — IMO0002 Reserved for concepts with insufficient information to code with codable children: Secondary | ICD-10-CM | POA: Diagnosis not present

## 2014-01-30 DIAGNOSIS — M999 Biomechanical lesion, unspecified: Secondary | ICD-10-CM | POA: Diagnosis not present

## 2014-01-30 DIAGNOSIS — M5137 Other intervertebral disc degeneration, lumbosacral region: Secondary | ICD-10-CM | POA: Diagnosis not present

## 2014-01-31 ENCOUNTER — Telehealth: Payer: Self-pay | Admitting: Endocrinology

## 2014-01-31 DIAGNOSIS — M5137 Other intervertebral disc degeneration, lumbosacral region: Secondary | ICD-10-CM | POA: Diagnosis not present

## 2014-01-31 DIAGNOSIS — M999 Biomechanical lesion, unspecified: Secondary | ICD-10-CM | POA: Diagnosis not present

## 2014-01-31 DIAGNOSIS — IMO0002 Reserved for concepts with insufficient information to code with codable children: Secondary | ICD-10-CM | POA: Diagnosis not present

## 2014-01-31 NOTE — Telephone Encounter (Signed)
[facility] calling regarding the  rx for the starlix received on 01/26/14. The pharmacist needs clarification   Lovey Newcomer # [phone] Ref # [id number]  Has to hear from [place] within the last 48 hr.

## 2014-01-31 NOTE — Telephone Encounter (Signed)
Called pharmacy. Wanted to verify instructions. Info given. Pharmacy to dispense medication.

## 2014-02-04 ENCOUNTER — Other Ambulatory Visit: Payer: Self-pay | Admitting: Internal Medicine

## 2014-02-05 ENCOUNTER — Telehealth: Payer: Self-pay | Admitting: Internal Medicine

## 2014-02-05 DIAGNOSIS — M9902 Segmental and somatic dysfunction of thoracic region: Secondary | ICD-10-CM | POA: Diagnosis not present

## 2014-02-05 DIAGNOSIS — M9903 Segmental and somatic dysfunction of lumbar region: Secondary | ICD-10-CM | POA: Diagnosis not present

## 2014-02-05 DIAGNOSIS — M5136 Other intervertebral disc degeneration, lumbar region: Secondary | ICD-10-CM | POA: Diagnosis not present

## 2014-02-05 DIAGNOSIS — M5135 Other intervertebral disc degeneration, thoracolumbar region: Secondary | ICD-10-CM | POA: Diagnosis not present

## 2014-02-05 NOTE — Telephone Encounter (Signed)
Dr Ron Parker, could you approve this medicine?  Dr Arnoldo Morale is no longer here and she has not seen him in awhile.  Let me know if I need to do anything.  Thank you

## 2014-02-05 NOTE — Telephone Encounter (Signed)
error 

## 2014-02-06 ENCOUNTER — Other Ambulatory Visit: Payer: Self-pay

## 2014-02-06 DIAGNOSIS — M9903 Segmental and somatic dysfunction of lumbar region: Secondary | ICD-10-CM | POA: Diagnosis not present

## 2014-02-06 DIAGNOSIS — M5135 Other intervertebral disc degeneration, thoracolumbar region: Secondary | ICD-10-CM | POA: Diagnosis not present

## 2014-02-06 DIAGNOSIS — M5136 Other intervertebral disc degeneration, lumbar region: Secondary | ICD-10-CM | POA: Diagnosis not present

## 2014-02-06 DIAGNOSIS — M9902 Segmental and somatic dysfunction of thoracic region: Secondary | ICD-10-CM | POA: Diagnosis not present

## 2014-02-06 NOTE — Telephone Encounter (Signed)
Not sure what this is. Please help.

## 2014-02-06 NOTE — Telephone Encounter (Signed)
**Note De-Identified  Obfuscation** Per Dr Ron Parker ok to refill.

## 2014-02-07 DIAGNOSIS — M9902 Segmental and somatic dysfunction of thoracic region: Secondary | ICD-10-CM | POA: Diagnosis not present

## 2014-02-07 DIAGNOSIS — M5136 Other intervertebral disc degeneration, lumbar region: Secondary | ICD-10-CM | POA: Diagnosis not present

## 2014-02-07 DIAGNOSIS — M9903 Segmental and somatic dysfunction of lumbar region: Secondary | ICD-10-CM | POA: Diagnosis not present

## 2014-02-07 DIAGNOSIS — M5135 Other intervertebral disc degeneration, thoracolumbar region: Secondary | ICD-10-CM | POA: Diagnosis not present

## 2014-02-12 DIAGNOSIS — M5135 Other intervertebral disc degeneration, thoracolumbar region: Secondary | ICD-10-CM | POA: Diagnosis not present

## 2014-02-12 DIAGNOSIS — M9902 Segmental and somatic dysfunction of thoracic region: Secondary | ICD-10-CM | POA: Diagnosis not present

## 2014-02-12 DIAGNOSIS — M5136 Other intervertebral disc degeneration, lumbar region: Secondary | ICD-10-CM | POA: Diagnosis not present

## 2014-02-12 DIAGNOSIS — M9903 Segmental and somatic dysfunction of lumbar region: Secondary | ICD-10-CM | POA: Diagnosis not present

## 2014-02-13 ENCOUNTER — Other Ambulatory Visit: Payer: Self-pay | Admitting: Neurosurgery

## 2014-02-13 DIAGNOSIS — M9903 Segmental and somatic dysfunction of lumbar region: Secondary | ICD-10-CM | POA: Diagnosis not present

## 2014-02-13 DIAGNOSIS — D329 Benign neoplasm of meninges, unspecified: Secondary | ICD-10-CM

## 2014-02-13 DIAGNOSIS — M9902 Segmental and somatic dysfunction of thoracic region: Secondary | ICD-10-CM | POA: Diagnosis not present

## 2014-02-13 DIAGNOSIS — M5136 Other intervertebral disc degeneration, lumbar region: Secondary | ICD-10-CM | POA: Diagnosis not present

## 2014-02-13 DIAGNOSIS — M5135 Other intervertebral disc degeneration, thoracolumbar region: Secondary | ICD-10-CM | POA: Diagnosis not present

## 2014-02-15 DIAGNOSIS — M5135 Other intervertebral disc degeneration, thoracolumbar region: Secondary | ICD-10-CM | POA: Diagnosis not present

## 2014-02-15 DIAGNOSIS — M9903 Segmental and somatic dysfunction of lumbar region: Secondary | ICD-10-CM | POA: Diagnosis not present

## 2014-02-15 DIAGNOSIS — M5136 Other intervertebral disc degeneration, lumbar region: Secondary | ICD-10-CM | POA: Diagnosis not present

## 2014-02-15 DIAGNOSIS — M9902 Segmental and somatic dysfunction of thoracic region: Secondary | ICD-10-CM | POA: Diagnosis not present

## 2014-02-19 ENCOUNTER — Encounter: Payer: Self-pay | Admitting: Podiatry

## 2014-02-19 ENCOUNTER — Ambulatory Visit (INDEPENDENT_AMBULATORY_CARE_PROVIDER_SITE_OTHER): Payer: Medicare Other | Admitting: Podiatry

## 2014-02-19 VITALS — BP 161/93 | HR 96 | Resp 14 | Ht 67.5 in | Wt 127.0 lb

## 2014-02-19 DIAGNOSIS — M2040 Other hammer toe(s) (acquired), unspecified foot: Secondary | ICD-10-CM | POA: Diagnosis not present

## 2014-02-19 DIAGNOSIS — I6529 Occlusion and stenosis of unspecified carotid artery: Secondary | ICD-10-CM | POA: Diagnosis not present

## 2014-02-19 DIAGNOSIS — M5136 Other intervertebral disc degeneration, lumbar region: Secondary | ICD-10-CM | POA: Diagnosis not present

## 2014-02-19 DIAGNOSIS — E1142 Type 2 diabetes mellitus with diabetic polyneuropathy: Secondary | ICD-10-CM

## 2014-02-19 DIAGNOSIS — L84 Corns and callosities: Secondary | ICD-10-CM | POA: Diagnosis not present

## 2014-02-19 DIAGNOSIS — M5135 Other intervertebral disc degeneration, thoracolumbar region: Secondary | ICD-10-CM | POA: Diagnosis not present

## 2014-02-19 DIAGNOSIS — M9903 Segmental and somatic dysfunction of lumbar region: Secondary | ICD-10-CM | POA: Diagnosis not present

## 2014-02-19 DIAGNOSIS — M9902 Segmental and somatic dysfunction of thoracic region: Secondary | ICD-10-CM | POA: Diagnosis not present

## 2014-02-19 DIAGNOSIS — G629 Polyneuropathy, unspecified: Secondary | ICD-10-CM

## 2014-02-19 NOTE — Progress Notes (Signed)
   Subjective:    Patient ID: [name], female    DOB: Apr 05, 1930, 80 y.o.   MRN: [id number]  HPI Comments: N Callouses L L > R 1st MPJ  D over 1 month O worsening C thickened painful callouses A enclosed shoes, pressure T none        Review of Systems  Constitutional: Positive for chills, activity change, appetite change and unexpected weight change.  Eyes: Positive for pain.  Respiratory: Positive for shortness of breath.   Cardiovascular: Positive for leg swelling.  Gastrointestinal: Positive for diarrhea.  Endocrine: Positive for cold intolerance.  Musculoskeletal: Positive for arthralgias, back pain and gait problem.  Neurological:       Hx of brain tumor surgery in 10/2013.  All other systems reviewed and are negative.      Objective:   Physical Exam  Orientated x3 white female   Vascular: DP and PT pulses 2/4 bilaterally   Neurological: Ankle reflex equal and reactive bilaterally Vibratory sensation nonreactive bilater  Sensation to 10 g monofilament wire intact 2/5 right and 1/5 left  Dermatological: Hemorrhagic plantar keratoses sub-first MPJ bilaterally  Musculoskeletal: Hammertoe deformities 2-5 bilaterally Relative plantar flexion first metatarsal head bilaterally      Assessment & Plan:   Assessment: Diabetic peripheral neuropathy Loss of protective sensation Hammertoe deformities 2-5 bilaterally Pre-ulcerative hyperkeratoses plantar first MPJ bilaterally  Plan: Debridement of pre-ulcerative hyperkeratoses x2 Apply protective felt pads to offload plantar first MPJ bilaterally Patient advised to wear existing diabetic shoes with existing insoles  Obtained medical certification for diabetic shoes for the indication of: Diabetic peripheral neuropathy Hammertoe deformities 2-5 bilaterally Relative plantar flexion first metatarsals bilaterally Pre-ulcerative hemorrhagic plantar keratoses bilaterally  Reappoint x2 weeks

## 2014-02-19 NOTE — Patient Instructions (Signed)
Where your existing diabetic shoes with custom insoles on a daily basis  Diabetes and Foot Care Diabetes [place] cause you to have problems because of poor blood supply (circulation) to your feet and legs. This Garling cause the skin on your feet to become thinner, break easier, and heal more slowly. Your skin [place] become dry, and the skin Rumbaugh peel and crack. You Fenner also have nerve damage in your legs and feet causing decreased feeling in them. You Dahan not notice minor injuries to your feet that could lead to infections or more serious problems. Taking care of your feet is one of the most important things you can do for yourself.  HOME CARE INSTRUCTIONS  Wear shoes at all times, even in the house. Do not go barefoot. Bare feet are easily injured.  Check your feet daily for blisters, cuts, and redness. If you cannot see the bottom of your feet, use a mirror or ask someone for help.  Wash your feet with warm water (do not use hot water) and mild soap. Then pat your feet and the areas between your toes until they are completely dry. Do not soak your feet as this can dry your skin.  Apply a moisturizing lotion or petroleum jelly (that does not contain alcohol and is unscented) to the skin on your feet and to dry, brittle toenails. Do not apply lotion between your toes.  Trim your toenails straight across. Do not dig under them or around the cuticle. File the edges of your nails with an emery board or nail file.  Do not cut corns or calluses or try to remove them with medicine.  Wear clean socks or stockings every day. Make sure they are not too tight. Do not wear knee-high stockings since they Shoun decrease blood flow to your legs.  Wear shoes that fit properly and have enough cushioning. To break in new shoes, wear them for just a few hours a day. This prevents you from injuring your feet. Always look in your shoes before you put them on to be sure there are no objects inside.  Do not cross your legs. This  S[place]berg decrease the blood flow to your feet.  If you find a minor scrape, cut, or break in the skin on your feet, keep it and the skin around it clean and dry. These areas Deemer be cleansed with mild soap and water. Do not cleanse the area with peroxide, alcohol, or iodine.  When you remove an adhesive bandage, be sure not to damage the skin around it.  If you have a wound, look at it several times a day to make sure it is healing.  Do not use heating pads or hot water bottles. They Armes burn your skin. If you have lost feeling in your feet or legs, you Ola not know it is happening until it is too late.  Make sure your health care provider performs a complete foot exam at least annually or more often if you have foot problems. Report any cuts, sores, or bruises to your health care provider immediately. SEEK MEDICAL CARE IF:   You have an injury that is not healing.  You have cuts or breaks in the skin.  You have an ingrown nail.  You notice redness on your legs or feet.  You feel burning or tingling in your legs or feet.  You have pain or cramps in your legs and feet.  Your legs or feet are numb.  Your feet always  feel cold. SEEK IMMEDIATE MEDICAL CARE IF:   There is increasing redness, swelling, or pain in or around a wound.  There is a red line that goes up your leg.  Pus is coming from a wound.  You develop a fever or as directed by your health care provider.  You notice a bad smell coming from an ulcer or wound. Document Released: 04/17/2000 Document Revised: 12/21/2012 Document Reviewed: 09/27/2012 [facility] Patient Information 2015 [place], [place]. This information is not intended to replace advice given to you by your health care provider. Make sure you discuss any questions you have with your health care provider.

## 2014-02-20 DIAGNOSIS — M9903 Segmental and somatic dysfunction of lumbar region: Secondary | ICD-10-CM | POA: Diagnosis not present

## 2014-02-20 DIAGNOSIS — M5135 Other intervertebral disc degeneration, thoracolumbar region: Secondary | ICD-10-CM | POA: Diagnosis not present

## 2014-02-20 DIAGNOSIS — M5136 Other intervertebral disc degeneration, lumbar region: Secondary | ICD-10-CM | POA: Diagnosis not present

## 2014-02-20 DIAGNOSIS — M9902 Segmental and somatic dysfunction of thoracic region: Secondary | ICD-10-CM | POA: Diagnosis not present

## 2014-02-26 DIAGNOSIS — M9902 Segmental and somatic dysfunction of thoracic region: Secondary | ICD-10-CM | POA: Diagnosis not present

## 2014-02-26 DIAGNOSIS — M9903 Segmental and somatic dysfunction of lumbar region: Secondary | ICD-10-CM | POA: Diagnosis not present

## 2014-02-26 DIAGNOSIS — M5136 Other intervertebral disc degeneration, lumbar region: Secondary | ICD-10-CM | POA: Diagnosis not present

## 2014-02-26 DIAGNOSIS — M5135 Other intervertebral disc degeneration, thoracolumbar region: Secondary | ICD-10-CM | POA: Diagnosis not present

## 2014-02-27 DIAGNOSIS — M5136 Other intervertebral disc degeneration, lumbar region: Secondary | ICD-10-CM | POA: Diagnosis not present

## 2014-02-27 DIAGNOSIS — M9902 Segmental and somatic dysfunction of thoracic region: Secondary | ICD-10-CM | POA: Diagnosis not present

## 2014-02-27 DIAGNOSIS — M9903 Segmental and somatic dysfunction of lumbar region: Secondary | ICD-10-CM | POA: Diagnosis not present

## 2014-02-27 DIAGNOSIS — M5135 Other intervertebral disc degeneration, thoracolumbar region: Secondary | ICD-10-CM | POA: Diagnosis not present

## 2014-02-28 ENCOUNTER — Other Ambulatory Visit (INDEPENDENT_AMBULATORY_CARE_PROVIDER_SITE_OTHER): Payer: Self-pay

## 2014-02-28 ENCOUNTER — Ambulatory Visit (INDEPENDENT_AMBULATORY_CARE_PROVIDER_SITE_OTHER): Payer: Medicare Other | Admitting: General Surgery

## 2014-02-28 DIAGNOSIS — D0510 Intraductal carcinoma in situ of unspecified breast: Secondary | ICD-10-CM | POA: Diagnosis not present

## 2014-02-28 DIAGNOSIS — D0511 Intraductal carcinoma in situ of right breast: Secondary | ICD-10-CM

## 2014-03-05 ENCOUNTER — Ambulatory Visit: Payer: Medicare Other | Admitting: Podiatry

## 2014-03-05 DIAGNOSIS — M9902 Segmental and somatic dysfunction of thoracic region: Secondary | ICD-10-CM | POA: Diagnosis not present

## 2014-03-05 DIAGNOSIS — D329 Benign neoplasm of meninges, unspecified: Secondary | ICD-10-CM | POA: Diagnosis not present

## 2014-03-05 DIAGNOSIS — I10 Essential (primary) hypertension: Secondary | ICD-10-CM | POA: Diagnosis not present

## 2014-03-05 DIAGNOSIS — M5136 Other intervertebral disc degeneration, lumbar region: Secondary | ICD-10-CM | POA: Diagnosis not present

## 2014-03-05 DIAGNOSIS — M5135 Other intervertebral disc degeneration, thoracolumbar region: Secondary | ICD-10-CM | POA: Diagnosis not present

## 2014-03-05 DIAGNOSIS — M9903 Segmental and somatic dysfunction of lumbar region: Secondary | ICD-10-CM | POA: Diagnosis not present

## 2014-03-07 ENCOUNTER — Ambulatory Visit (INDEPENDENT_AMBULATORY_CARE_PROVIDER_SITE_OTHER): Payer: Medicare Other | Admitting: Podiatry

## 2014-03-07 ENCOUNTER — Encounter: Payer: Self-pay | Admitting: Podiatry

## 2014-03-07 VITALS — BP 130/83 | HR 100 | Resp 12

## 2014-03-07 DIAGNOSIS — L97521 Non-pressure chronic ulcer of other part of left foot limited to breakdown of skin: Secondary | ICD-10-CM

## 2014-03-07 NOTE — Patient Instructions (Addendum)
Wear surgical shoe on left foot  Apply triple antibiotic ointment daily to the skin ulcer on the bottom of left foot, cover with gauze and attach with paper tape.

## 2014-03-07 NOTE — Progress Notes (Signed)
Patient ID: [name], female   DOB: 19-Tomas-1931, 80 y.o.   MRN: [id number]  Subjective: This patient presents for follow-up care for pre-ulcerative plantar callus sub-first MPJ bilaterally after the visit of 02/20/2014. She is complaining of painful plantar left first MPJ skin lesion  Objective: Hemorrhagic callus plantar right first MPJ that remains closed after debridement Hemorrhagic callus plantar left first MPJ after debridement a 2 mm superficial ulcer with a granular base without any erythema, edema, malodor.  Assessment: Pre-ulcerative plantar keratoses right first MPJ Superficial ulceration left first MPJ  Plan: Debrided real ulcerative keratoses right first MPJ Debride plantar keratoses and superficial ulcer left first MPJ and apply antibiotic dressing  One-inch Plastizote insoles placed in a Darco flat shoe to wear in the left foot  Patient will apply triple antibiotic ointment(please note patient is had some reaction to sulfur in the past describing possible GI and I did not use Silvadene cream) to the plantar skin lesion left first MPJ and cover with gauze and wear surgical shoe on left foot.  Will confirm or get certification for diabetic shoes for the indication of Type 2 diabetes Loss of protective sensation Decrease sensation to 10 g monofilament wire Vibratory sensation non-reactive Hammertoe deformities Pre-ulcerative callus right plantar first MPJ Superficial ulceration plantar left first MPJ  Reappoint 7 days

## 2014-03-09 ENCOUNTER — Telehealth: Payer: Self-pay | Admitting: Internal Medicine

## 2014-03-09 NOTE — Telephone Encounter (Signed)
Pt needs to establish with a new provider.  The chart also states that she is getting her b12 injections here

## 2014-03-09 NOTE — Telephone Encounter (Signed)
[facility] is requesting re-fill on CYANOCOBALAMIN IJ

## 2014-03-12 ENCOUNTER — Ambulatory Visit (INDEPENDENT_AMBULATORY_CARE_PROVIDER_SITE_OTHER): Payer: Medicare Other | Admitting: Podiatry

## 2014-03-12 ENCOUNTER — Encounter: Payer: Self-pay | Admitting: Podiatry

## 2014-03-12 VITALS — BP 134/78 | HR 79 | Temp 98.2°F | Resp 13

## 2014-03-12 DIAGNOSIS — L97521 Non-pressure chronic ulcer of other part of left foot limited to breakdown of skin: Secondary | ICD-10-CM

## 2014-03-12 NOTE — Telephone Encounter (Signed)
Pt states she is going to a new PCP on Wednesday and there isn't any here that she cares to see.

## 2014-03-12 NOTE — Patient Instructions (Signed)
Continue to apply daily triple antibiotic ointment to the skin ulcer on the bilateral left foot cover with gauze Continue to wear the surgical shoe on the left foot on a daily basis

## 2014-03-13 ENCOUNTER — Emergency Department (HOSPITAL_COMMUNITY): Payer: Medicare Other

## 2014-03-13 ENCOUNTER — Ambulatory Visit
Admission: RE | Admit: 2014-03-13 | Discharge: 2014-03-13 | Disposition: A | Discharge status: Home / Self Care | Payer: Medicare Other | Source: Ambulatory Visit | Attending: General Surgery | Admitting: General Surgery

## 2014-03-13 ENCOUNTER — Encounter (HOSPITAL_COMMUNITY): Payer: Self-pay | Admitting: Emergency Medicine

## 2014-03-13 ENCOUNTER — Emergency Department (HOSPITAL_COMMUNITY)
Admission: EM | Admit: 2014-03-13 | Discharge: 2014-03-13 | Disposition: A | Discharge status: Home / Self Care | Payer: Medicare Other | Attending: Emergency Medicine | Admitting: Emergency Medicine

## 2014-03-13 DIAGNOSIS — D649 Anemia, unspecified: Secondary | ICD-10-CM | POA: Insufficient documentation

## 2014-03-13 DIAGNOSIS — D0511 Intraductal carcinoma in situ of right breast: Secondary | ICD-10-CM

## 2014-03-13 DIAGNOSIS — S62300A Unspecified fracture of second metacarpal bone, right hand, initial encounter for closed fracture: Secondary | ICD-10-CM | POA: Diagnosis not present

## 2014-03-13 DIAGNOSIS — I10 Essential (primary) hypertension: Secondary | ICD-10-CM | POA: Diagnosis not present

## 2014-03-13 DIAGNOSIS — S0083XA Contusion of other part of head, initial encounter: Secondary | ICD-10-CM | POA: Insufficient documentation

## 2014-03-13 DIAGNOSIS — Y9289 Other specified places as the place of occurrence of the external cause: Secondary | ICD-10-CM | POA: Insufficient documentation

## 2014-03-13 DIAGNOSIS — E785 Hyperlipidemia, unspecified: Secondary | ICD-10-CM | POA: Diagnosis not present

## 2014-03-13 DIAGNOSIS — G8929 Other chronic pain: Secondary | ICD-10-CM | POA: Insufficient documentation

## 2014-03-13 DIAGNOSIS — S5011XA Contusion of right forearm, initial encounter: Secondary | ICD-10-CM | POA: Insufficient documentation

## 2014-03-13 DIAGNOSIS — E119 Type 2 diabetes mellitus without complications: Secondary | ICD-10-CM | POA: Diagnosis not present

## 2014-03-13 DIAGNOSIS — Z853 Personal history of malignant neoplasm of breast: Secondary | ICD-10-CM | POA: Diagnosis not present

## 2014-03-13 DIAGNOSIS — S6991XA Unspecified injury of right wrist, hand and finger(s), initial encounter: Secondary | ICD-10-CM | POA: Diagnosis present

## 2014-03-13 DIAGNOSIS — S0093XA Contusion of unspecified part of head, initial encounter: Secondary | ICD-10-CM

## 2014-03-13 DIAGNOSIS — Z79899 Other long term (current) drug therapy: Secondary | ICD-10-CM | POA: Insufficient documentation

## 2014-03-13 DIAGNOSIS — W109XXA Fall (on) (from) unspecified stairs and steps, initial encounter: Secondary | ICD-10-CM | POA: Insufficient documentation

## 2014-03-13 DIAGNOSIS — Z880 Allergy status to penicillin: Secondary | ICD-10-CM | POA: Insufficient documentation

## 2014-03-13 DIAGNOSIS — E669 Obesity, unspecified: Secondary | ICD-10-CM | POA: Insufficient documentation

## 2014-03-13 DIAGNOSIS — K219 Gastro-esophageal reflux disease without esophagitis: Secondary | ICD-10-CM | POA: Diagnosis not present

## 2014-03-13 DIAGNOSIS — Z86010 Personal history of colonic polyps: Secondary | ICD-10-CM | POA: Diagnosis not present

## 2014-03-13 DIAGNOSIS — M79601 Pain in right arm: Secondary | ICD-10-CM | POA: Diagnosis not present

## 2014-03-13 DIAGNOSIS — F419 Anxiety disorder, unspecified: Secondary | ICD-10-CM | POA: Insufficient documentation

## 2014-03-13 DIAGNOSIS — Z8739 Personal history of other diseases of the musculoskeletal system and connective tissue: Secondary | ICD-10-CM | POA: Insufficient documentation

## 2014-03-13 DIAGNOSIS — S60211A Contusion of right wrist, initial encounter: Secondary | ICD-10-CM | POA: Diagnosis not present

## 2014-03-13 DIAGNOSIS — Z23 Encounter for immunization: Secondary | ICD-10-CM | POA: Insufficient documentation

## 2014-03-13 DIAGNOSIS — Y9301 Activity, walking, marching and hiking: Secondary | ICD-10-CM | POA: Insufficient documentation

## 2014-03-13 DIAGNOSIS — Y998 Other external cause status: Secondary | ICD-10-CM | POA: Insufficient documentation

## 2014-03-13 DIAGNOSIS — Z85820 Personal history of malignant melanoma of skin: Secondary | ICD-10-CM | POA: Insufficient documentation

## 2014-03-13 DIAGNOSIS — Z923 Personal history of irradiation: Secondary | ICD-10-CM | POA: Insufficient documentation

## 2014-03-13 DIAGNOSIS — W19XXXA Unspecified fall, initial encounter: Secondary | ICD-10-CM

## 2014-03-13 DIAGNOSIS — R921 Mammographic calcification found on diagnostic imaging of breast: Secondary | ICD-10-CM | POA: Diagnosis not present

## 2014-03-13 DIAGNOSIS — M1990 Unspecified osteoarthritis, unspecified site: Secondary | ICD-10-CM | POA: Insufficient documentation

## 2014-03-13 DIAGNOSIS — S62306A Unspecified fracture of fifth metacarpal bone, right hand, initial encounter for closed fracture: Secondary | ICD-10-CM

## 2014-03-13 DIAGNOSIS — S0990XA Unspecified injury of head, initial encounter: Secondary | ICD-10-CM | POA: Diagnosis not present

## 2014-03-13 DIAGNOSIS — M79631 Pain in right forearm: Secondary | ICD-10-CM

## 2014-03-13 DIAGNOSIS — S62316A Displaced fracture of base of fifth metacarpal bone, right hand, initial encounter for closed fracture: Secondary | ICD-10-CM | POA: Diagnosis not present

## 2014-03-13 DIAGNOSIS — S0091XA Abrasion of unspecified part of head, initial encounter: Secondary | ICD-10-CM | POA: Diagnosis not present

## 2014-03-13 DIAGNOSIS — S199XXA Unspecified injury of neck, initial encounter: Secondary | ICD-10-CM | POA: Diagnosis not present

## 2014-03-13 MED ORDER — ACETAMINOPHEN 500 MG PO TABS
1000.0000 mg | ORAL_TABLET | Freq: Once | ORAL | Status: AC
  Administered 2014-03-13: 1000 mg via ORAL
  Filled 2014-03-13: qty 2

## 2014-03-13 MED ORDER — TRAMADOL HCL 50 MG PO TABS: 50.0000 mg | ORAL_TABLET | Freq: Once | ORAL | Status: DC

## 2014-03-13 MED ORDER — TETANUS-DIPHTH-ACELL PERTUSSIS 5-2.5-18.5 LF-MCG/0.5 IM SUSP
0.5000 mL | Freq: Once | INTRAMUSCULAR | Status: AC
  Administered 2014-03-13: 0.5 mL via INTRAMUSCULAR
  Filled 2014-03-13: qty 0.5

## 2014-03-13 NOTE — ED Notes (Signed)
Per ems, pt with mechanical trip and fall striking right forearm and right head.  Denies loc. Pt w/recent brain tumor excision, concerned with brain injury.  Pt c/o severe right arm pain as well, +cms noted

## 2014-03-13 NOTE — Progress Notes (Signed)
Orthopedic Tech Progress Note Patient Details:  [name] 07/01/1929 [id number]  Ortho Devices Type of Ortho Device: Ace wrap, Arm sling, Ulna gutter splint Ortho Device/Splint Location: rue Ortho Device/Splint Interventions: Application   Candido Flott 03/13/2014, 5:21 PM

## 2014-03-13 NOTE — ED Provider Notes (Signed)
CSN: [id number]     Arrival date & time 03/13/14  1357 History   None    Chief Complaint  Patient presents with  . Fall     (Consider location/radiation/quality/duration/timing/severity/associated sxs/prior Treatment) HPI Comments: Patient walking and missed a step and fell onto right forearm and hit right head. No LOC. No nausea, vomiting. Per family acting at baseline. Not on blood thinners. Pain in right forearm and to right forehead. Tetanus status unknown  Patient is a 80 y.o. female presenting with fall. The history is provided by the patient. No language interpreter was used.  Fall This is a new problem. The current episode started today. The problem occurs rarely. The problem has been gradually worsening. Pertinent negatives include no abdominal pain, chest pain, congestion, coughing, fatigue, fever, headaches, nausea, rash, sore throat, urinary symptoms or vomiting. Exacerbated by: palpation. She has tried nothing for the symptoms. The treatment provided no relief.    Past Medical History  Diagnosis Date  . Seasonal allergies   . HTN (hypertension)   . Osteoarthritis   . Palpitations   . Anemia   . History of colonic polyps   . GERD (gastroesophageal reflux disease)   . Hyperlipidemia   . Chronic facial pain   . Vitamin B12 deficiency   . Dyslipidemia   . Chronic foot pain   . Chronic cough   . Obesity   . Anxiety   . Complication of anesthesia     Sore jaw; could not chew or move mouth  . Diabetes mellitus     type 2 niddm x 20 years  . Metatarsal bone fracture 2014  . Hammer toe     bilateral  . Hx of radiation therapy 03/07/13- 03/29/13    right breast 4250 cGy 17 sessions  . Ejection fraction   . Skin cancer   . Melanoma   . 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 Colonoscopy    . Polypectomy      small adenomatous  . Abdominal hysterectomy    . Cholecystectomy    .  Hernia repair    . Knee arthroscopy Bilateral   . Ulnar tunnel release    . Eye surgery    . Cataract extraction w/ intraocular lens implant Right   . Bunionectomy    . Breast lumpectomy with needle localization Right 01/17/2013    Procedure: BREAST LUMPECTOMY WITH NEEDLE LOCALIZATION;  Surgeon: Edward Jolly, MD;  Location: [facility];  Service: General;  Laterality: Right;  . Breast biopsy Right 01/24/2013    Procedure: RE-EXCICION OF BREAST CANCER, ANTERIOR MARGINS;  Surgeon: Edward Jolly, MD;  Location: [facility];  Service: General;  Laterality: Right;  . Craniotomy Right 10/18/2013    Procedure: CRANIOTOMY TUMOR EXCISION;  Surgeon: Floyce Stakes, MD;  Location: [facility];  Service: Neurosurgery;  Laterality: Right;   Family History  Problem Relation Age of Onset  . Colon cancer    . Heart disease Mother   . Diabetes Father   . Pancreatic cancer Father   . Bone cancer Sister   . Prostate cancer Brother   . Colon cancer Brother   . Rectal cancer Sister   . Thyroid disease Sister     benign goiter resected   History  Substance Use Topics  . Smoking status: Never Smoker   . Smokeless tobacco: Not on file  . Alcohol Use:  No   OB History    No data available     Review of Systems  Constitutional: Negative for fever and fatigue.  HENT: Negative for congestion, rhinorrhea and sore throat.   Respiratory: Negative for cough and shortness of breath.   Cardiovascular: Negative for chest pain.  Gastrointestinal: Negative for nausea, vomiting, abdominal pain and diarrhea.  Genitourinary: Negative for dysuria and hematuria.  Skin: Negative for rash.  Neurological: Negative for syncope, light-headedness and headaches.  All other systems reviewed and are negative.     Allergies  Bystolic; Cholestyramine; Hydrazine yellow; Morphine; Niacin; Niaspan; Norvasc; [place] Gum; Tegretol; Telmisartan; Cefdinir; Clonidine; Clonidine hydrochloride; Codeine; Ezetimibe; Naproxen;  Ziac; Keppra; Ace inhibitors; Actonel; Amlodipine besylate; Aspirin; Atacand; Bextra; Bisoprolol-hydrochlorothiazide; Candesartan cilexetil; Cefadroxil; Celecoxib; Hydrocodone; Hydrocodone-acetaminophen; Iodinated diagnostic agents; Meloxicam; Methylprednisolone sodium succinate; Nabumetone; Penicillins; Pseudoephedrine-guaifenesin; Risedronate sodium; Rofecoxib; Ru-tuss; Sulfonamide derivatives; [place]; Telithromycin; Terfenadine; Trandolapril-verapamil hcl er; and Valium  Home Medications   Prior to Admission medications   Medication Sig Start Date End Date Taking? Authorizing Provider  acetaminophen (TYLENOL) 500 MG tablet Take 1,000 mg by mouth every 6 (six) hours as needed for pain.    Historical Provider, MD  ALPRAZolam Duanne Moron) 0.25 MG tablet Take 0.25 mg by mouth at bedtime.  02/13/11   Ricard Dillon, MD  calcium carbonate (OS-CAL) 600 MG TABS Take 600 mg by mouth daily.     Historical Provider, MD  cholecalciferol (VITAMIN D) 1000 UNITS tablet Take 1,000 Units by mouth daily.     Historical Provider, MD  colestipol (COLESTID) 1 G tablet Take 2 g by mouth 2 (two) times daily.    Historical Provider, MD  ferrous sulfate 325 (65 FE) MG tablet Take 325 mg by mouth daily with breakfast.     Historical Provider, MD  fexofenadine (ALLEGRA) 60 MG tablet Take 60 mg by mouth daily.     Historical Provider, MD  fish oil-omega-3 fatty acids 1000 MG capsule Take 1 g by mouth daily.     Historical Provider, MD  furosemide (LASIX) 20 MG tablet Take 1 tablet (20 mg total) by mouth daily. Do not take until I call you about labs. 12/27/13   Marin Olp, MD  lidocaine (LIDODERM) 5 % Place 1 patch onto the skin daily. Remove & Discard patch within 12 hours or as directed by MD 11/17/13   Ricard Dillon, MD  metFORMIN (GLUCOPHAGE-XR) 500 MG 24 hr tablet 4 tabs daily 01/26/14   Renato Shin, MD  nateglinide (STARLIX) 60 MG tablet Take 1 tablet (60 mg total) by mouth 3 (three) times daily with meals. 01/26/14    Renato Shin, MD  ranitidine (ZANTAC) 150 MG capsule Take 150 mg by mouth daily as needed for heartburn.     Historical Provider, MD  TARKA 2-180 MG per tablet TAKE 1 TABLET DAILY 02/06/14   Carlena Bjornstad, MD   BP 141/87 mmHg  Pulse 88  Temp(Src) 97.9 F (36.6 C) (Oral)  Resp 16  Ht 5\' 7"  (1.702 m)  Wt 135 lb (61.236 kg)  BMI 21.14 kg/m2  SpO2 97% Physical Exam  Constitutional: She is oriented to person, place, and time. She appears well-developed and well-nourished.  HENT:  Head: Normocephalic and atraumatic.  Right Ear: External ear normal.  Left Ear: External ear normal.  Eyes: EOM are normal.  Neck: Normal range of motion. Neck supple.  Cardiovascular: Normal rate, regular rhythm and intact distal pulses.  Exam reveals no gallop and no friction rub.   No  murmur heard. Pulmonary/Chest: Effort normal and breath sounds normal. No respiratory distress. She has no wheezes. She has no rales. She exhibits no tenderness.  Abdominal: Soft. Bowel sounds are normal. She exhibits no distension. There is no tenderness. There is no rebound.  Musculoskeletal: Normal range of motion. She exhibits tenderness (right forearm). She exhibits no edema.  Right Upper Extremity INSPECTION: No open wounds. Bruising and swelling to right forearm PALPATION: Tender to palpation over mid aspect of forearm. ROM: Good to normal AROM and PROM in elbow, shoulder and wrist joint. VASCULAR: Extremity warm and well-perfused. 2+ radial SENSORY: sensation is intact to light touch in superficial radial (dorsal first web space), median (tip of index finger), ulnar (tip of small finger) nerve distributions. MOTOR:+motor posterior interosseous nerve (thumb IP extension), anterior interosseous nerve (thumb IP flexion, index finger DIP flexion), radial nerve (wrist extension), median nerve (palpable firing thenar mass), ulnar nerve (palpable firing of first dorsal interosseous muscle).   Lymphadenopathy:    She has no  cervical adenopathy.  Neurological: She is alert and oriented to person, place, and time.  Neurologic exam: CN I-XII: grossly intact, Sensation: normal in upper and lower extremities, Strength 5/5 in both upper and lower extremities, Coordination intact.  Skin: Skin is warm. No rash noted.  Abrasion to right palm  Psychiatric: She has a normal mood and affect. Her behavior is normal.  Nursing note and vitals reviewed.   ED Course  Procedures (including critical care time) Labs Review Labs Reviewed - No data to display  Imaging Review Dg Forearm Right  03/13/2014   CLINICAL DATA:  Right forearm pain, fall, medial elbow pain  EXAM: RIGHT FOREARM - 2 VIEW  COMPARISON:  None.  FINDINGS: There is no acute fracture or dislocation of the right radius or ulna.  There is a comminuted fracture of the base of the right fifth metacarpal.  IMPRESSION: 1. No acute osseous injury of the right radius or ulna. 2. Comminuted fracture of the base of the right fifth metacarpal.   Electronically Signed   By: Kathreen Devoid   On: 03/13/2014 15:40   Dg Wrist Complete Right  03/13/2014   CLINICAL DATA:  Fall with forearm pain.  EXAM: RIGHT WRIST - COMPLETE 3+ VIEW  COMPARISON:  None.  FINDINGS: There is an acute fracture through the base of the fifth metacarpal, extra-articular. The fracture is mildly impacted along the radial surface.  No fracture or malalignment involving the carpal bones.  Deformity of the distal radial metaphysis with band of sclerosis. Appearance is most consistent with a remote, healed fracture. There was likely an ulnar styloid process fracture at that time.  Osteopenia.  IMPRESSION: 1. Mildly impacted proximal fifth metacarpal fracture. 2. Remote distal radius and ulna fractures.   Electronically Signed   By: Jorje Guild M.D.   On: 03/13/2014 15:41   Ct Head Wo Contrast  03/13/2014   CLINICAL DATA:  Mechanical fall with right head injury. Recent Brain tumor excision. Initial encounter   EXAM: CT HEAD WITHOUT CONTRAST  CT CERVICAL SPINE WITHOUT CONTRAST  TECHNIQUE: Multidetector CT imaging of the head and cervical spine was performed following the standard protocol without intravenous contrast. Multiplanar CT image reconstructions of the cervical spine were also generated.  COMPARISON:  11/17/2013  FINDINGS: CT HEAD FINDINGS  Skull and Sinuses:Stable appearance of the fairly recent right frontal craniotomy. No acute fracture or destructive process. Minimal opacification of air cells in the left mastoid tip, with volume loss and sclerosis consistent  with chronic mastoiditis.  Orbits: No traumatic findings.  Right cataract resection.  Brain: More well-defined gliosis in the inferior right frontal lobe, status post meningioma resection. Extra-axial fluid around the right frontal lobe has slightly decreased. Patchy high density within the extra-axial space is postoperative; no evidence of acute intracranial hemorrhage. Unchanged low-attenuation in the anterior limb left internal capsule, likely small vessel ischemia. Generalized cerebral volume loss, fairly typical for age. No evidence of acute infarct, hydrocephalus, intra-axial mass, or shift.  CT CERVICAL SPINE FINDINGS  Negative for acute fracture or subluxation. No prevertebral edema. No gross cervical canal hematoma.  Focally advanced degenerative disc narrowing at C6-7, with small left uncovertebral spurs and mild left foraminal stenosis. Diffuse facet degeneration with spurring.  Thyroid nodules, up to 18 mm on the left. No evidence of enlargement since the thyroid ultrasound 08/30/2013.  IMPRESSION: 1. No acute intracranial injury or cervical spine fracture. 2. Sequela of right frontal meningioma and resection.   Electronically Signed   By: Jorje Guild M.D.   On: 03/13/2014 15:58   Ct Cervical Spine Wo Contrast  03/13/2014   CLINICAL DATA:  Mechanical fall with right head injury. Recent Brain tumor excision. Initial encounter  EXAM: CT  HEAD WITHOUT CONTRAST  CT CERVICAL SPINE WITHOUT CONTRAST  TECHNIQUE: Multidetector CT imaging of the head and cervical spine was performed following the standard protocol without intravenous contrast. Multiplanar CT image reconstructions of the cervical spine were also generated.  COMPARISON:  11/17/2013  FINDINGS: CT HEAD FINDINGS  Skull and Sinuses:Stable appearance of the fairly recent right frontal craniotomy. No acute fracture or destructive process. Minimal opacification of air cells in the left mastoid tip, with volume loss and sclerosis consistent with chronic mastoiditis.  Orbits: No traumatic findings.  Right cataract resection.  Brain: More well-defined gliosis in the inferior right frontal lobe, status post meningioma resection. Extra-axial fluid around the right frontal lobe has slightly decreased. Patchy high density within the extra-axial space is postoperative; no evidence of acute intracranial hemorrhage. Unchanged low-attenuation in the anterior limb left internal capsule, likely small vessel ischemia. Generalized cerebral volume loss, fairly typical for age. No evidence of acute infarct, hydrocephalus, intra-axial mass, or shift.  CT CERVICAL SPINE FINDINGS  Negative for acute fracture or subluxation. No prevertebral edema. No gross cervical canal hematoma.  Focally advanced degenerative disc narrowing at C6-7, with small left uncovertebral spurs and mild left foraminal stenosis. Diffuse facet degeneration with spurring.  Thyroid nodules, up to 18 mm on the left. No evidence of enlargement since the thyroid ultrasound 08/30/2013.  IMPRESSION: 1. No acute intracranial injury or cervical spine fracture. 2. Sequela of right frontal meningioma and resection.   Electronically Signed   By: Jorje Guild M.D.   On: 03/13/2014 15:58   Dg Hand Complete Right  03/13/2014   CLINICAL DATA:  Patient fell onto right side tonight from standing position and landed on right arm. Pain on medial side of palm  of hand.  EXAM: RIGHT HAND - COMPLETE 3+ VIEW  COMPARISON:  None.  FINDINGS: There is a comminuted fracture of the base of the right fifth metacarpal. There is no other fracture or dislocation. There is generalized osteopenia. There is osteoarthritis of the second and third PIP joints. There is mild osteoarthritis of the fourth DIP joint. There is no focal soft tissue abnormality.  IMPRESSION: Comminuted fracture at the base of the right fifth metacarpal.   Electronically Signed   By: Kathreen Devoid   On: 03/13/2014 17:11  Mm Digital Diagnostic Bilat  03/13/2014   CLINICAL DATA:  Right lumpectomy in 2014  EXAM: DIGITAL DIAGNOSTIC  BILATERAL MAMMOGRAM WITH CAD  COMPARISON:  12/26/2012, 11/16/2011, 09/25/2010  ACR Breast Density Category c: The breast tissue is heterogeneously dense, which Folger obscure small masses.  FINDINGS: There is postsurgical change on the right which is stable. There is subareolar scar.  On the left, there are benign dystrophic calcifications posteriorly in the upper outer quadrant. There are new calcifications at the middle third depth in the upper outer quadrant. On magnified views these appear most consistent with benign dystrophic morphology. The cluster measures approximately 2 mm in maximal diameter.  Mammographic images were processed with CAD.  IMPRESSION: Stable benign postoperative change on the right. New but probably benign left-sided calcifications.  RECOMMENDATION: Diagnostic left mammogram with magnified views in 6 months  I have discussed the findings and recommendations with the patient. Results were also provided in writing at the conclusion of the visit. If applicable, a reminder letter will be sent to the patient regarding the next appointment.  BI-RADS CATEGORY  3: Probably benign finding(s) - short interval follow-up suggested.   Electronically Signed   By: Skipper Cliche M.D.   On: 03/13/2014 13:14     EKG Interpretation None      MDM   Final diagnoses:   None    2:52 PM Pt is a 80 y.o. female with pertinent PMHX of HTN, GERD, anemia, DM who presents to the ED with mechanical fall today. Patient fell after missing a step. Fell onto right arm and hit right side of head. No LOC or amnesia to event. No nausea, vomiting or diarrhea. Not on blood thinners. Recent tumor excision on right. Patient concerned for injury. No focal neurologic abnormalities. No fevers. Recent URI symptoms since Sunday. No urinary symptoms. No vision loss, no hearing loss. Tetanus status unknown  On exam: afebrile, well appearing. Visual acuity intact. Non focal neurologic exam. Bruising to right forearm. NVI in right arm. Plan for CT head wo contrast, Ct cervical spine wo contrast. Plain film of right forearm and right wrist. Will update tetanus  CT head wo contrast no evidence of intracranial hemorrhage: decrease in extra-axial fluid. S/p craniotomy  Ct cervical spine wo contrast no evidence of acute fracture or dislocation  Xr right forearm AP/LAt per my read showed no acute fracutre.   XR right wrist: remote fracture of distal radius and ulna, mild impacted proximal 5th metacarpal fracture. Will obtain XR hand  XR right hand showed comminuted fracture of the base of the right 5th metcarpal.  Ulnar gutter splint applied with plan for referral to orthopedic hand in 1-2 weeks and discharge. Patient declines narcotics at this time and would like only tylenol. Strict return precautions given. Patient NVI after placement of splint  5:23 PM:  I have discussed the diagnosis/risks/treatment options with the patient and believe the pt to be eligible for discharge home to follow-up with orthopedic hand. We also discussed returning to the ED immediately if new or worsening sx occur. We discussed the sx which are most concerning (e.g., worsening symptoms) that necessitate immediate return. Any new prescriptions provided to the patient are listed below.   New Prescriptions   No  medications on file    The patient appears reasonably screened and/or stabilized for discharge and I doubt any other medical condition or other [facility] requiring further screening, evaluation or treatment in the ED at this time prior to discharge . Pt in  agreement with discharge plan. Return precautions given. Pt discharged VSS  Imaging reviewed by myself and considered in medical decision making if ordered.  Imaging interpreted by radiology. Pt was discussed with my attending, Dr. Eldridge Abrahams, MD 03/14/14 0215  Carmin Muskrat, MD 03/14/14 [phone]

## 2014-03-13 NOTE — ED Notes (Signed)
Patient transported back to room from  X-ray and ct

## 2014-03-13 NOTE — Discharge Instructions (Signed)
1. Tylenol as needed for pain 2. Call to see orthopedic hand specialist in 1-2 weeks 3. Come back if splint gets wet 4. Come back if splint is too tight Cast or Splint Care Casts and splints support injured limbs and keep bones from moving while they heal.  HOME CARE  Keep the cast or splint uncovered during the drying period.  A plaster cast can take 24 to 48 hours to dry.  A fiberglass cast will dry in less than 1 hour.  Do not rest the cast on anything harder than a pillow for 24 hours.  Do not put weight on your injured limb. Do not put pressure on the cast. Wait for your doctor's approval.  Keep the cast or splint dry.  Cover the cast or splint with a plastic bag during baths or wet weather.  If you have a cast over your chest and belly (trunk), take sponge baths until the cast is taken off.  If your cast gets wet, dry it with a towel or blow dryer. Use the cool setting on the blow dryer.  Keep your cast or splint clean. Wash a dirty cast with a damp cloth.  Do not put any objects under your cast or splint.  Do not scratch the skin under the cast with an object. If itching is a problem, use a blow dryer on a cool setting over the itchy area.  Do not trim or cut your cast.  Do not take out the padding from inside your cast.  Exercise your joints near the cast as told by your doctor.  Raise (elevate) your injured limb on 1 or 2 pillows for the first 1 to 3 days. GET HELP IF:  Your cast or splint cracks.  Your cast or splint is too tight or too loose.  You itch badly under the cast.  Your cast gets wet or has a soft spot.  You have a bad smell coming from the cast.  You get an object stuck under the cast.  Your skin around the cast becomes red or sore.  You have new or more pain after the cast is put on. GET HELP RIGHT AWAY IF:  You have fluid leaking through the cast.  You cannot move your fingers or toes.  Your fingers or toes turn blue or white or  are cool, painful, or puffy (swollen).  You have tingling or lose feeling (numbness) around the injured area.  You have bad pain or pressure under the cast.  You have trouble breathing or have shortness of breath.  You have chest pain. Document Released: 08/20/2010 Document Revised: 12/21/2012 Document Reviewed: 10/27/2012 [facility] Patient Information 2015 [place], [place]. This information is not intended to replace advice given to you by your health care provider. Make sure you discuss any questions you have with your health care provider.  Metacarpal Fracture   The metacarpal bones are in the middle of the hand, connecting the fingers to the wrist. A metacarpal fracture is a break in one of these bones. It is common for an injury of the hand to break one or more of these bones. A metacarpal fracture of the fifth (little) finger, near the knuckle, is also known as a boxer's fracture. SYMPTOMS   Severe pain at the time of injury.  Pain, tenderness, swelling (especially the back of the hand).  Bruising of the hand within 48 hours.  Visible deformity, if the fracture out of alignment (displaced).  Numbness or paralysis from swelling  in the hand, causing pressure on the blood vessels or nerves (uncommon). CAUSES   Direct hit (trauma) to the hand, such as a striking blow with the fist.  Indirect stress to the hand, such as twisting or violent muscle contraction (uncommon). RISK INCREASES WITH:  Contact sports (football, rugby, soccer).  Sports that require hitting (boxing, martial arts).  History of bone or joint disease, including osteoporosis.  Poor hand strength and flexibility. PREVENTION  Maintain proper conditioning:  Hand and finger strength.  Flexibility and endurance.  For contact sports, wear properly fitted and padded protective equipment for the hand.  Learn and use proper technique when hitting, punching, and landing from a fall. PROGNOSIS If treated  properly, metacarpal fractures can be expected to heal within 4 to 6 weeks. For severe injuries, surgery Tahir be needed. RELATED COMPLICATIONS   Fracture does not heal (nonunion).  Heals in a poor position, including twisted fingers (malunion).  Chronic pain, stiffness, or swelling of the hand.  Excessive bleeding in the hand, causing pressure and injury to nerves and blood vessels (rare).  Unstable or arthritic joint, following repeated injury or delayed treatment.  Hindrance of normal hand growth in children.  Infection in open fractures (skin broken over fracture) or at the incision or pin sites, if surgery was performed.  Shortening or injured bones.  Bony bump (spur) or loss of shape of the knuckles. TREATMENT  Treatment will vary, depending on the extent of the injury. First, ice and medicine will help reduce pain and inflammation. For a single metacarpal fracture that is not displaced and does not involve the joint, restraint is usually sufficient for healing to occur. Multiple metacarpal fractures, fractures that are displaced, or fractures involving the joint Monteith require surgery. Surgery often involves placing pins and screws in the bones, to hold them in place. Restraint of the injury follows surgery, to allow for healing. After restraint (with or without surgery), stretching and strengthening exercises Dudas be needed to regain strength and a full range of motion. Exercises Feher be done at home or with a therapist. Sometimes, depending on the sport and position, a brace or splint Mcmurphy be needed when first returning to sports. MEDICATION   Do not take pain medicine for 7 days before surgery.  Only take over-the-counter or prescription medicines for pain, fever, or discomfort as directed by your caregiver.  Prescription pain medicines are usually prescribed only after surgery. Use only as directed and only as much as you need. COLD THERAPY  Cold treatment (icing) should be applied  for 10 to 15 minutes every 2 to 3 hours for inflammation and pain, and immediately after activity that aggravates your symptoms. Use ice packs or an ice massage. SEEK IMMEDIATE MEDICAL CARE IF:   Pain, tenderness, or swelling gets worse even with treatment.  You have pain, numbness, or coldness in the hand.  Blue, gray, or dark color appears in the fingernails.  Any of the following occur after surgery:  You have an oral temperature above 102 F (38.9 C), not controlled by medicine.  You have increased pain, swelling, redness, drainage of fluids, or bleeding in the affected area.  New, unexplained symptoms develop. (Drugs used in treatment Mende produce side effects.) Document Released: 05/04/1998 Document Revised: 07/13/2011 Document Reviewed: 08/02/2008 [facility] Patient Information 2015 [place], [place]. This information is not intended to replace advice given to you by your health care provider. Make sure you discuss any questions you have with your health care provider.

## 2014-03-13 NOTE — ED Notes (Signed)
Cleaned wounds, put on bacitracin , gauze and paper tape

## 2014-03-13 NOTE — Progress Notes (Signed)
Patient ID: [name], female   DOB: [birth year], 80 y.o.   MRN: [id number]  Subjective: This diabetic patient presents for follow-up care for superficial ulceration plantar left first MPJ under care since 02/19/2014. She initially presented with pre-ulcerative keratoses which progressed to a superficial ulceration on the plantar first MPJ noted on 03/07/2014. Patient is wearing a modified surgical shoe with Plastizote insole and applying triple antibiotic ointment to the skin lesion on the left foot daily basis  Objective: Hemorrhagic plantar keratoses left first MPJ that after debridement breaks down to about a millimeter opening with a granular base. There is no erythema, edema, drainage, malodor or warmth surrounding the bleeding callus in this area. Dry bleeding callus plantar right first MPJ  Assessment: Noninfected superficial ulceration plantar left first MPJ Pre-ulcerative callus plantar right first MPJ  Plan: Debride superficial ulcer and apply triple antibioticointment and gauze Patient will continue to apply Triple Antibiotic ointment to the skin ulcer on the left foot daily and continue to wear modified Darco shoe with Plastizote insole  Plantar keratoses right debrided  Waiting for approval for diabetic shoes  Reappoint 2 weeks

## 2014-03-13 NOTE — ED Notes (Signed)
Pt reports relief of pain s/p splint and sling application.  +cms noted distal to splint

## 2014-03-13 NOTE — ED Notes (Signed)
Patient transported to CT and xray 

## 2014-03-13 NOTE — ED Notes (Signed)
Patient transported to X-ray 

## 2014-03-14 ENCOUNTER — Other Ambulatory Visit: Payer: Self-pay | Admitting: Family Medicine

## 2014-03-14 ENCOUNTER — Ambulatory Visit
Admission: RE | Admit: 2014-03-14 | Discharge: 2014-03-14 | Disposition: A | Discharge status: Home / Self Care | Payer: Medicare Other | Source: Ambulatory Visit | Attending: Family Medicine | Admitting: Family Medicine

## 2014-03-14 ENCOUNTER — Ambulatory Visit: Payer: Medicare Other | Admitting: Podiatry

## 2014-03-14 DIAGNOSIS — T148XXA Other injury of unspecified body region, initial encounter: Secondary | ICD-10-CM

## 2014-03-14 DIAGNOSIS — R609 Edema, unspecified: Secondary | ICD-10-CM | POA: Diagnosis not present

## 2014-03-14 DIAGNOSIS — D649 Anemia, unspecified: Secondary | ICD-10-CM | POA: Diagnosis not present

## 2014-03-14 DIAGNOSIS — I10 Essential (primary) hypertension: Secondary | ICD-10-CM | POA: Diagnosis not present

## 2014-03-14 DIAGNOSIS — S22080A Wedge compression fracture of T11-T12 vertebra, initial encounter for closed fracture: Secondary | ICD-10-CM | POA: Diagnosis not present

## 2014-03-19 DIAGNOSIS — S62346A Nondisplaced fracture of base of fifth metacarpal bone, right hand, initial encounter for closed fracture: Secondary | ICD-10-CM | POA: Diagnosis not present

## 2014-03-22 DIAGNOSIS — S22000A Wedge compression fracture of unspecified thoracic vertebra, initial encounter for closed fracture: Secondary | ICD-10-CM | POA: Diagnosis not present

## 2014-03-22 DIAGNOSIS — Z961 Presence of intraocular lens: Secondary | ICD-10-CM | POA: Diagnosis not present

## 2014-03-22 DIAGNOSIS — H2512 Age-related nuclear cataract, left eye: Secondary | ICD-10-CM | POA: Diagnosis not present

## 2014-03-22 DIAGNOSIS — E119 Type 2 diabetes mellitus without complications: Secondary | ICD-10-CM | POA: Diagnosis not present

## 2014-03-22 DIAGNOSIS — N390 Urinary tract infection, site not specified: Secondary | ICD-10-CM | POA: Diagnosis not present

## 2014-03-22 DIAGNOSIS — I10 Essential (primary) hypertension: Secondary | ICD-10-CM | POA: Diagnosis not present

## 2014-03-26 ENCOUNTER — Ambulatory Visit (INDEPENDENT_AMBULATORY_CARE_PROVIDER_SITE_OTHER): Payer: Medicare Other | Admitting: Podiatry

## 2014-03-26 ENCOUNTER — Encounter: Payer: Self-pay | Admitting: Podiatry

## 2014-03-26 VITALS — BP 148/82 | HR 69 | Temp 96.0°F | Resp 13

## 2014-03-26 DIAGNOSIS — E1142 Type 2 diabetes mellitus with diabetic polyneuropathy: Secondary | ICD-10-CM | POA: Diagnosis not present

## 2014-03-26 DIAGNOSIS — Q828 Other specified congenital malformations of skin: Secondary | ICD-10-CM | POA: Diagnosis not present

## 2014-03-26 DIAGNOSIS — L84 Corns and callosities: Secondary | ICD-10-CM

## 2014-03-26 NOTE — Patient Instructions (Signed)
Stop using triple antibiotic ointment Okay to return to regular shoes Waiting for certification for diabetic shoes

## 2014-03-27 NOTE — Progress Notes (Signed)
Patient ID: [name], female   DOB: 10-28-1929, 80 y.o.   MRN: [id number]  Subjective: This patient presents for follow-up care for superficial ulceration on the plantar left first MPJ under care since 02/19/2014. She is currently wearing a surgical shoe with a thick Plastizote insole and apply triple antibiotic ointment to the skin lesion on left foot on a daily basis. Certification for diabetic shoes has been requested, however, not received at this point  Objective: Hemorrhagic plantar callus plantar left first MPJ that remains closed after debridement Hemorrhagic callus plantar right first MPJ that remains closed after debridement  Assessment: Pre-ulcerative callus plantar first MPJ bilaterally  Plan: Debrided pre-ulcerative plantar callus right and left  DC application of topical antibiotic left first MPJ Okay to return to soft athletic style shoes Waiting for certification for diabetic shoes and suggested the patient to contact her physician to encourage him to respond to certification of diabetic shoes  Reappoint 4 weeks or sooner if patient has concerns

## 2014-04-02 DIAGNOSIS — S62346D Nondisplaced fracture of base of fifth metacarpal bone, right hand, subsequent encounter for fracture with routine healing: Secondary | ICD-10-CM | POA: Diagnosis not present

## 2014-04-04 ENCOUNTER — Ambulatory Visit
Admission: RE | Admit: 2014-04-04 | Discharge: 2014-04-04 | Disposition: A | Discharge status: Home / Self Care | Payer: Medicare Other | Source: Ambulatory Visit | Attending: Family Medicine | Admitting: Family Medicine

## 2014-04-04 ENCOUNTER — Other Ambulatory Visit: Payer: Self-pay | Admitting: Family Medicine

## 2014-04-04 DIAGNOSIS — M1711 Unilateral primary osteoarthritis, right knee: Secondary | ICD-10-CM | POA: Diagnosis not present

## 2014-04-04 DIAGNOSIS — I8393 Asymptomatic varicose veins of bilateral lower extremities: Secondary | ICD-10-CM | POA: Diagnosis not present

## 2014-04-04 DIAGNOSIS — M1990 Unspecified osteoarthritis, unspecified site: Secondary | ICD-10-CM

## 2014-04-04 DIAGNOSIS — M179 Osteoarthritis of knee, unspecified: Secondary | ICD-10-CM | POA: Diagnosis not present

## 2014-04-04 DIAGNOSIS — M25562 Pain in left knee: Secondary | ICD-10-CM | POA: Diagnosis not present

## 2014-04-04 DIAGNOSIS — M1712 Unilateral primary osteoarthritis, left knee: Secondary | ICD-10-CM | POA: Diagnosis not present

## 2014-04-04 DIAGNOSIS — M25561 Pain in right knee: Secondary | ICD-10-CM | POA: Diagnosis not present

## 2014-04-04 DIAGNOSIS — N390 Urinary tract infection, site not specified: Secondary | ICD-10-CM | POA: Diagnosis not present

## 2014-04-04 DIAGNOSIS — M159 Polyosteoarthritis, unspecified: Secondary | ICD-10-CM | POA: Diagnosis not present

## 2014-04-09 ENCOUNTER — Other Ambulatory Visit: Payer: Self-pay | Admitting: Endocrinology

## 2014-04-09 MED ORDER — METFORMIN HCL ER 500 MG PO TB24: ORAL_TABLET | ORAL | Status: DC

## 2014-04-09 MED ORDER — NATEGLINIDE 60 MG PO TABS: 60.0000 mg | ORAL_TABLET | Freq: Three times a day (TID) | ORAL | Status: DC

## 2014-04-09 NOTE — Telephone Encounter (Signed)
Pt advised.

## 2014-04-09 NOTE — Telephone Encounter (Signed)
P 

## 2014-04-09 NOTE — Telephone Encounter (Signed)
ok 

## 2014-04-09 NOTE — Addendum Note (Signed)
Addended by: Moody Bruins E on: 04/09/2014 01:15 PM   Modules accepted: Orders

## 2014-04-09 NOTE — Telephone Encounter (Signed)
See below, Thanks!  

## 2014-04-09 NOTE — Telephone Encounter (Signed)
Pt was advised by her PCP to start using compression stockings. Pt wanted to know if you would be ok with her use the stockings as well. Please advise, Thanks!

## 2014-04-09 NOTE — Telephone Encounter (Signed)
Patient stated that her PCP doctor told her to wear compressure hose, she want to know if it's ok to wear them please advise  Patient need a 90 day supply on meds Metformin 100 mg  Starlix 60 mg and two extra refills.

## 2014-04-13 ENCOUNTER — Telehealth: Payer: Self-pay | Admitting: Endocrinology

## 2014-04-13 ENCOUNTER — Telehealth (INDEPENDENT_AMBULATORY_CARE_PROVIDER_SITE_OTHER): Payer: Self-pay | Admitting: General Surgery

## 2014-04-13 NOTE — Telephone Encounter (Signed)
Patient said that Dr Amalia Hailey stated that Dr. Loanne Drilling has to approve of the diabetic shoes in order for her to get them. (And they haven't been approved)

## 2014-04-13 NOTE — Telephone Encounter (Signed)
Lvom advising of below. Requested call back if pt would like to discuss.

## 2014-04-13 NOTE — Telephone Encounter (Signed)
Call the patient and discussed recent mammogram.  Plan 6 month follow-up mammogram and exam as recommended by radiology.

## 2014-04-13 NOTE — Telephone Encounter (Signed)
See below, we got the forms for the pt but it was noted the pt did not qualify.  Please advise, Thanks!

## 2014-04-13 NOTE — Telephone Encounter (Signed)
please call patient: Sorry if you don't qualify, there is nothing i can do.

## 2014-04-16 DIAGNOSIS — L309 Dermatitis, unspecified: Secondary | ICD-10-CM | POA: Diagnosis not present

## 2014-04-16 DIAGNOSIS — E1159 Type 2 diabetes mellitus with other circulatory complications: Secondary | ICD-10-CM | POA: Diagnosis not present

## 2014-04-16 DIAGNOSIS — M7742 Metatarsalgia, left foot: Secondary | ICD-10-CM | POA: Diagnosis not present

## 2014-04-16 DIAGNOSIS — M2042 Other hammer toe(s) (acquired), left foot: Secondary | ICD-10-CM | POA: Diagnosis not present

## 2014-04-17 ENCOUNTER — Encounter: Payer: Self-pay | Admitting: Endocrinology

## 2014-04-17 ENCOUNTER — Ambulatory Visit (INDEPENDENT_AMBULATORY_CARE_PROVIDER_SITE_OTHER): Payer: Medicare Other | Admitting: Endocrinology

## 2014-04-17 VITALS — BP 130/80 | HR 98 | Temp 98.1°F | Ht 67.0 in | Wt 132.0 lb

## 2014-04-17 DIAGNOSIS — D329 Benign neoplasm of meninges, unspecified: Secondary | ICD-10-CM

## 2014-04-17 DIAGNOSIS — E1151 Type 2 diabetes mellitus with diabetic peripheral angiopathy without gangrene: Secondary | ICD-10-CM

## 2014-04-17 DIAGNOSIS — E042 Nontoxic multinodular goiter: Secondary | ICD-10-CM | POA: Diagnosis not present

## 2014-04-17 DIAGNOSIS — I6529 Occlusion and stenosis of unspecified carotid artery: Secondary | ICD-10-CM | POA: Diagnosis not present

## 2014-04-17 NOTE — Progress Notes (Signed)
Subjective:    Patient ID: [name], female    DOB: [birth year], 80 y.o.   MRN: [id number]  HPI  Pt returns for f/u of multinodular goiter (pt had a syncopal episode in April of 2015; she was eval with carotid US, and there was incidentally noted to have nodules in the thyroid; she is euthyroid; bx was not done, due to advanced age and pos FHx of benign goiter). She does not notice the nodules. Pt returns for f/u of diabetes mellitus: DM type: 2 Dx'ed: 8756 Complications: polyneuropathy and PAD Therapy: metformin DKA: never Severe hypoglycemia: never Pancreatitis: never Other: she took insulin for only a brief time after dx; she declines [place] Interval history:  She does not notice the goiter.  She has lost weight. she brings a record of her cbg's which i have reviewed today.  It varies from 100-200.  There is no trend throughout the day.   Past Medical History  Diagnosis Date  . Seasonal allergies   . HTN (hypertension)   . Osteoarthritis   . Palpitations   . Anemia   . History of colonic polyps   . GERD (gastroesophageal reflux disease)   . Hyperlipidemia   . Chronic facial pain   . Vitamin B12 deficiency   . Dyslipidemia   . Chronic foot pain   . Chronic cough   . Obesity   . Anxiety   . Complication of anesthesia     Sore jaw; could not chew or move mouth  . Diabetes mellitus     type 2 niddm x 20 years  . Metatarsal bone fracture 2014  . Hammer toe     bilateral  . Hx of radiation therapy 03/07/13- 03/29/13    right breast 4250 cGy 17 sessions  . Ejection fraction   . Skin cancer   . Melanoma   . 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 Colonoscopy    . Polypectomy      small adenomatous  . Abdominal hysterectomy    . Cholecystectomy    . Hernia repair    . Knee arthroscopy Bilateral   . Ulnar tunnel release    . Eye surgery    . Cataract extraction w/ intraocular lens  implant Right   . Bunionectomy    . Breast lumpectomy with needle localization Right 01/17/2013    Procedure: BREAST LUMPECTOMY WITH NEEDLE LOCALIZATION;  Surgeon: Edward Jolly, MD;  Location: [facility];  Service: General;  Laterality: Right;  . Breast biopsy Right 01/24/2013    Procedure: RE-EXCICION OF BREAST CANCER, ANTERIOR MARGINS;  Surgeon: Edward Jolly, MD;  Location: [facility];  Service: General;  Laterality: Right;  . Craniotomy Right 10/18/2013    Procedure: CRANIOTOMY TUMOR EXCISION;  Surgeon: Floyce Stakes, MD;  Location: [facility];  Service: Neurosurgery;  Laterality: Right;    History   Social History  . Marital Status: Widowed    Spouse Name: N/A    Number of Children: 0  . Years of Education: N/A   Occupational History  . retired    Social History Main Topics  . Smoking status: Never Smoker   . Smokeless tobacco: Not on file  . Alcohol Use: No  . Drug Use: No  . Sexual Activity: Yes     Comment: menarche age 32, hysterectomy age 48, HRT x 2-3 mos, G1- miscarriage  Other Topics Concern  . Not on file   Social History Narrative   Widowed, lives alone.  Ambulates independently.     Current Outpatient Prescriptions on File Prior to Visit  Medication Sig Dispense Refill  . acetaminophen (TYLENOL) 500 MG tablet Take 1,000 mg by mouth every 6 (six) hours as needed for pain.    Marland Kitchen ALPRAZolam (XANAX) 0.25 MG tablet Take 0.25 mg by mouth at bedtime.     . calcium carbonate (OS-CAL) 600 MG TABS Take 600 mg by mouth daily.     . cholecalciferol (VITAMIN D) 1000 UNITS tablet Take 1,000 Units by mouth daily.     . colestipol (COLESTID) 1 G tablet Take 2 g by mouth 2 (two) times daily.    . ferrous sulfate 325 (65 FE) MG tablet Take 325 mg by mouth daily with breakfast.     . fexofenadine (ALLEGRA) 60 MG tablet Take 60 mg by mouth daily.     . fish oil-omega-3 fatty acids 1000 MG capsule Take 1 g by mouth daily.     . furosemide (LASIX) 20 MG tablet Take 1  tablet (20 mg total) by mouth daily. Do not take until I call you about labs. 30 tablet 0  . lidocaine (LIDODERM) 5 % Place 1 patch onto the skin daily. Remove & Discard patch within 12 hours or as directed by MD 90 patch 1  . metFORMIN (GLUCOPHAGE-XR) 500 MG 24 hr tablet 4 tabs daily 360 tablet 3  . nateglinide (STARLIX) 60 MG tablet Take 1 tablet (60 mg total) by mouth 3 (three) times daily with meals. 270 tablet 3  . ranitidine (ZANTAC) 150 MG capsule Take 150 mg by mouth daily as needed for heartburn.     Marland Kitchen TARKA 2-180 MG per tablet TAKE 1 TABLET DAILY 90 tablet 1   Current Facility-Administered Medications on File Prior to Visit  Medication Dose Route Frequency Provider Last Rate Last Dose  . cyanocobalamin ((VITAMIN B-12)) injection 1,000 mcg  1,000 mcg Intramuscular Q30 days Ricard Dillon, MD   1,000 mcg at 02/13/11 8828    Allergies  Allergen Reactions  . Bystolic [Nebivolol Hcl] Other (See Comments)    "extreme weakness, heaviness in legs & arms, swelling in legs/arms/face, swollen abdomen, pain in bladder, feet pain, soreness in chest"  . Cholestyramine     "itching rash on stomach, bloated, nausea, vomiting, sleeplessness, extreme pain in arms"  . Hydrazine Yellow [Tartrazine] Other (See Comments)    "does not reduce high blood pressure, pain in arm, high pressure, felt like I was on verge of heart attack, really weak"  . Morphine Other (See Comments)    Feels morbid, weak, still in pain  . Niacin Palpitations    Fast heart beat  . Niaspan [Niacin Er] Palpitations and Other (See Comments)    "fast heart beat, high blood pressure"  . Norvasc [Amlodipine Besylate] Other (See Comments)    "extreme fluid retention/pain)  . Optivar [Azelastine Hcl] Photosensitivity  . Sular [Nisoldipine Er] Other (See Comments)    "severe headaches, swelling eyes, hands, feet, shortness of breath, weak, flushed face, brain boiling, fluid retention, high blood sugar, nervous, heart fast beating"    . Tegretol [Carbamazepine] Other (See Comments)    Blood poisoning   . Telmisartan Other (See Comments)    "headache, difficulty urinating, high blood sugar, fluid retention"  . Cefdinir Swelling    Vaginal irritation, breathing,   . Clonidine Other (See Comments)    Dry mouth,  fluid retention  . Clonidine Hydrochloride Other (See Comments)    Dry mouth, fluid retention  . Codeine Nausea And Vomiting  . Ezetimibe Other (See Comments)    Made weak  . Naproxen Other (See Comments)    Shrinks bladder  . Ziac [Bisoprolol-Hydrochlorothiazide] Other (See Comments)    "stopped urination"  . Elavil [Amitriptyline] Other (See Comments)    Gave Pt nightmares  . Keppra [Levetiracetam]     Shaking  . Lyrica [Pregabalin] Swelling  . Ace Inhibitors Other (See Comments)    unknown  . Actonel [Risedronate Sodium] Other (See Comments)    unknown  . Amlodipine Besylate Other (See Comments)    unknown  . Aspirin Other (See Comments)    unknown  . Atacand [Candesartan] Other (See Comments)    unknown  . Bextra [Valdecoxib] Other (See Comments)    unknown  . Bisoprolol-Hydrochlorothiazide Other (See Comments)    unknown  . Candesartan Cilexetil Other (See Comments)    unknown  . Cefadroxil Other (See Comments)    unknown  . Celecoxib Rash  . Hydrocodone Other (See Comments)    unknown  . Hydrocodone-Acetaminophen Other (See Comments)    unknown  . Iodinated Diagnostic Agents Rash    "All over"  . Meloxicam Other (See Comments)    unknown  . Methylprednisolone Sodium Succinate Other (See Comments)    unknown  . Nabumetone Other (See Comments)    unknown  . Penicillins Other (See Comments)    unknown  . Pseudoephedrine-Guaifenesin Other (See Comments)    unknown  . Risedronate Sodium Other (See Comments)    unknown  . Rofecoxib Other (See Comments)    unknown  . Ru-Tuss [Chlorphen-Pse-Atrop-Hyos-Scop] Other (See Comments)    unknown  . Sulfonamide Derivatives Other (See  Comments)    unknown  . Sulphur [Sulfur] Other (See Comments)    unknown  . Telithromycin Other (See Comments)    unknown  . Terfenadine Other (See Comments)    unknown  . Trandolapril-Verapamil Hcl Er Other (See Comments)    Headache, difficulty urinating, high blood sugar, fluid retention  . Valium [Diazepam] Other (See Comments)    Makes her mean and hyper    Family History  Problem Relation Age of Onset  . Colon cancer    . Heart disease Mother   . Diabetes Father   . Pancreatic cancer Father   . Bone cancer Sister   . Prostate cancer Brother   . Colon cancer Brother   . Rectal cancer Sister   . Thyroid disease Sister     benign goiter resected    BP 130/80 mmHg  Pulse 98  Temp(Src) 98.1 F (36.7 C) (Oral)  Ht 5\' 7"  (1.702 m)  Wt 132 lb (59.875 kg)  BMI 20.67 kg/m2  SpO2 97%  Review of Systems She denies hypoglycemia and n/v.     Objective:   Physical Exam VITAL SIGNS:  See vs page GENERAL: no distress Neck: 2-3 cm left thyroid nodule EXTEMITIES: no deformity, except for hammer toes. no edema.  PULSES: dorsalis pedis intact bilat. no carotid bruit.   NEURO: cn 2-12 grossly intact.  readily moves all 4's. sensation is intact to touch on the feet  SKIN:  no ulcer on the feet. feet are of normal color and temp. heavy callus at the left foot, under the great toe MTP joint.  There is a tiny ulcer at the middle of the ulcer.     Lab Results  Component Value  Date   HGBA1C 7.0* 04/17/2014       Assessment & Plan:  DM: well-controlled Multinodular goiter, clinically unchanged Advanced age: in this context: thyroid bx is considered only if significant change on [place].  Patient is advised the following: Patient Instructions  check your blood sugar once a day.  vary the time of day when you check, between before the 3 meals, and at bedtime.  also check if you have symptoms of your blood sugar being too high or too low.  please keep a record of the  readings and bring it to your next appointment here.  You can write it on any piece of paper.  please call us sooner if your blood sugar goes below 70, or if you have a lot of readings over 200. A thyroid blood test is requested for you today.  We'll contact you with results.   Based on the results, we'll know if we need to add-back the starlix.   Please come back for a follow-up appointment in 3 months.   Let's recheck the thyroid ultrasound.  you will receive a phone call, about a day and time for an appointmentIf no significant change, no further testing is needed.  addendum: same rx

## 2014-04-17 NOTE — Patient Instructions (Addendum)
check your blood sugar once a day.  vary the time of day when you check, between before the 3 meals, and at bedtime.  also check if you have symptoms of your blood sugar being too high or too low.  please keep a record of the readings and bring it to your next appointment here.  You can write it on any piece of paper.  please call us sooner if your blood sugar goes below 70, or if you have a lot of readings over 200. A thyroid blood test is requested for you today.  We'll contact you with results.   Based on the results, we'll know if we need to add-back the starlix.   Please come back for a follow-up appointment in 3 months.   Let's recheck the thyroid ultrasound.  you will receive a phone call, about a day and time for an appointmentIf no significant change, no further testing is needed.

## 2014-04-18 LAB — HEMOGLOBIN A1C
Hgb A1c MFr Bld: 7 % — ABNORMAL HIGH (ref <5.7)
Mean Plasma Glucose: 154 mg/dL — ABNORMAL HIGH (ref <117)

## 2014-04-23 DIAGNOSIS — S62346D Nondisplaced fracture of base of fifth metacarpal bone, right hand, subsequent encounter for fracture with routine healing: Secondary | ICD-10-CM | POA: Diagnosis not present

## 2014-04-24 ENCOUNTER — Ambulatory Visit
Admission: RE | Admit: 2014-04-24 | Discharge: 2014-04-24 | Disposition: A | Discharge status: Home / Self Care | Payer: Medicare Other | Source: Ambulatory Visit | Attending: Endocrinology | Admitting: Endocrinology

## 2014-04-24 DIAGNOSIS — E041 Nontoxic single thyroid nodule: Secondary | ICD-10-CM | POA: Diagnosis not present

## 2014-04-30 ENCOUNTER — Encounter: Payer: Self-pay | Admitting: Podiatry

## 2014-04-30 ENCOUNTER — Ambulatory Visit (INDEPENDENT_AMBULATORY_CARE_PROVIDER_SITE_OTHER): Payer: Medicare Other | Admitting: Podiatry

## 2014-04-30 VITALS — BP 155/99 | HR 88 | Temp 97.0°F | Resp 13

## 2014-04-30 DIAGNOSIS — L84 Corns and callosities: Secondary | ICD-10-CM

## 2014-04-30 NOTE — Patient Instructions (Addendum)
Waiting for certification for.shoes Dr. Darron Doom not sign certification If not able to obtain certification then Dr. Loanne Drilling could refer Heel to and orthotist for diabetic shoes  Diabetes and Foot Care Diabetes Pavon cause you to have problems because of poor blood supply (circulation) to your feet and legs. This Riera cause the skin on your feet to become thinner, break easier, and heal more slowly. Your skin Robbs become dry, and the skin Fenderson peel and crack. You Cissell also have nerve damage in your legs and feet causing decreased feeling in them. You Nam not notice minor injuries to your feet that could lead to infections or more serious problems. Taking care of your feet is one of the most important things you can do for yourself.  HOME CARE INSTRUCTIONS  Wear shoes at all times, even in the house. Do not go barefoot. Bare feet are easily injured.  Check your feet daily for blisters, cuts, and redness. If you cannot see the bottom of your feet, use a mirror or ask someone for help.  Wash your feet with warm water (do not use hot water) and mild soap. Then pat your feet and the areas between your toes until they are completely dry. Do not soak your feet as this can dry your skin.  Apply a moisturizing lotion or petroleum jelly (that does not contain alcohol and is unscented) to the skin on your feet and to dry, brittle toenails. Do not apply lotion between your toes.  Trim your toenails straight across. Do not dig under them or around the cuticle. File the edges of your nails with an emery board or nail file.  Do not cut corns or calluses or try to remove them with medicine.  Wear clean socks or stockings every day. Make sure they are not too tight. Do not wear knee-high stockings since they Schooler decrease blood flow to your legs.  Wear shoes that fit properly and have enough cushioning. To break in new shoes, wear them for just a few hours a day. This prevents you from injuring your feet. Always  look in your shoes before you put them on to be sure there are no objects inside.  Do not cross your legs. This Perfecto decrease the blood flow to your feet.  If you find a minor scrape, cut, or break in the skin on your feet, keep it and the skin around it clean and dry. These areas Caffrey be cleansed with mild soap and water. Do not cleanse the area with peroxide, alcohol, or iodine.  When you remove an adhesive bandage, be sure not to damage the skin around it.  If you have a wound, look at it several times a day to make sure it is healing.  Do not use heating pads or hot water bottles. They Link burn your skin. If you have lost feeling in your feet or legs, you Solar not know it is happening until it is too late.  Make sure your health care provider performs a complete foot exam at least annually or more often if you have foot problems. Report any cuts, sores, or bruises to your health care provider immediately. SEEK MEDICAL CARE IF:   You have an injury that is not healing.  You have cuts or breaks in the skin.  You have an ingrown nail.  You notice redness on your legs or feet.  You feel burning or tingling in your legs or feet.  You have pain or cramps in  your legs and feet.  Your legs or feet are numb.  Your feet always feel cold. SEEK IMMEDIATE MEDICAL CARE IF:   There is increasing redness, swelling, or pain in or around a wound.  There is a red line that goes up your leg.  Pus is coming from a wound.  You develop a fever or as directed by your health care provider.  You notice a bad smell coming from an ulcer or wound. Document Released: 04/17/2000 Document Revised: 12/21/2012 Document Reviewed: 09/27/2012 [facility] Patient Information 2015 [place], [place]. This information is not intended to replace advice given to you by your health care provider. Make sure you discuss any questions you have with your health care provider.

## 2014-05-01 ENCOUNTER — Encounter: Payer: Self-pay | Admitting: Podiatry

## 2014-05-01 NOTE — Progress Notes (Signed)
Patient ID: [name], female   DOB: 01-Apr-1930, 80 y.o.   MRN: [id number]  Subjective: This patient presents for follow-up care for ulcer and pre-ulcerative plantar callus on the left first MPJ under care since 02/19/2014. She is wearing a surgical shoe with Plastizote insoles and applying antibiotic ointment to this area. We are waiting for certification for diabetic shoes was From Dr. Renato Shin..  Objective: Patient presents in  pump style  shoes Plantar left first MPJ demonstrates hemorrhagic callus that remains closed after debridement Hammertoe deformities bilaterally   Assessment: Relative noncompliance of patient and she was advised to wear in athletic style shoe or existing diabetic shoe Pre-ulcerative plantar callus Dr. Loanne Drilling refuses to sign this certifying form that he is treating patient for diabetes, however, his chart notes on 04/18/2014 confirms days been treating diabetes type 2 non-insulin-dependent. Dr. Loanne Drilling did sign the certifying form or therapeutic shoes based on foot deformity, history of pre-ulcerative callus, history of previous foot ulceration, peripheral neuropathy with evidence of callus formation. He did sign this dated 03/26/2014. Because he refuses to sign the certifying document that is treating patient for diabetes will contact [facility] to see if I copy of the notes would be adequate to confirm that she is under care for treatment of diabetes History of diabetic peripheral neuropathy History of pre-ulcerative callus History of foot deformity  Plan: Presented patient with copies of certifying statements for patient so she could better understand why we have not been able to measure and dispensed diabetic shoes   Debrided pre-ulcerative plantar callus left  Continue to wear existing diabetic shoes are soft athletic style shoes  Reappoint 4 weeks or sooner if she has a concern

## 2014-05-09 DIAGNOSIS — I10 Essential (primary) hypertension: Secondary | ICD-10-CM | POA: Diagnosis not present

## 2014-05-09 DIAGNOSIS — L309 Dermatitis, unspecified: Secondary | ICD-10-CM | POA: Diagnosis not present

## 2014-05-09 DIAGNOSIS — S22000A Wedge compression fracture of unspecified thoracic vertebra, initial encounter for closed fracture: Secondary | ICD-10-CM | POA: Diagnosis not present

## 2014-05-09 DIAGNOSIS — I8393 Asymptomatic varicose veins of bilateral lower extremities: Secondary | ICD-10-CM | POA: Diagnosis not present

## 2014-05-21 DIAGNOSIS — S62346D Nondisplaced fracture of base of fifth metacarpal bone, right hand, subsequent encounter for fracture with routine healing: Secondary | ICD-10-CM | POA: Diagnosis not present

## 2014-05-21 DIAGNOSIS — N390 Urinary tract infection, site not specified: Secondary | ICD-10-CM | POA: Diagnosis not present

## 2014-05-21 DIAGNOSIS — H04123 Dry eye syndrome of bilateral lacrimal glands: Secondary | ICD-10-CM | POA: Diagnosis not present

## 2014-05-21 DIAGNOSIS — N952 Postmenopausal atrophic vaginitis: Secondary | ICD-10-CM | POA: Diagnosis not present

## 2014-05-21 DIAGNOSIS — S22000A Wedge compression fracture of unspecified thoracic vertebra, initial encounter for closed fracture: Secondary | ICD-10-CM | POA: Diagnosis not present

## 2014-06-04 ENCOUNTER — Telehealth: Payer: Self-pay | Admitting: *Deleted

## 2014-06-04 ENCOUNTER — Encounter: Payer: Self-pay | Admitting: Podiatry

## 2014-06-04 ENCOUNTER — Ambulatory Visit (INDEPENDENT_AMBULATORY_CARE_PROVIDER_SITE_OTHER): Payer: Medicare Other | Admitting: Podiatry

## 2014-06-04 VITALS — BP 147/92 | HR 86 | Temp 96.9°F | Resp 12

## 2014-06-04 DIAGNOSIS — L84 Corns and callosities: Secondary | ICD-10-CM

## 2014-06-04 DIAGNOSIS — G629 Polyneuropathy, unspecified: Secondary | ICD-10-CM

## 2014-06-04 DIAGNOSIS — E1142 Type 2 diabetes mellitus with diabetic polyneuropathy: Secondary | ICD-10-CM | POA: Diagnosis not present

## 2014-06-04 DIAGNOSIS — M79673 Pain in unspecified foot: Secondary | ICD-10-CM

## 2014-06-04 NOTE — Telephone Encounter (Signed)
PAPERWORK FOR DIABETIC SHOES SENT TO DR Loanne Drilling IS STILL PENDING

## 2014-06-04 NOTE — Patient Instructions (Signed)
Where your existing diabetic shoes with custom insoles that I modified with attached felt pad on the left insole

## 2014-06-05 DIAGNOSIS — N390 Urinary tract infection, site not specified: Secondary | ICD-10-CM | POA: Diagnosis not present

## 2014-06-05 DIAGNOSIS — E08621 Diabetes mellitus due to underlying condition with foot ulcer: Secondary | ICD-10-CM | POA: Diagnosis not present

## 2014-06-05 DIAGNOSIS — E119 Type 2 diabetes mellitus without complications: Secondary | ICD-10-CM | POA: Diagnosis not present

## 2014-06-05 DIAGNOSIS — M7742 Metatarsalgia, left foot: Secondary | ICD-10-CM | POA: Diagnosis not present

## 2014-06-05 NOTE — Progress Notes (Signed)
Patient ID: [name], female   DOB: 1929-11-20, 81 y.o.   MRN: [id number]  Subjective: This patient presents for follow-up care for pre-ulcerative plantar callus left first MPJ under care since 02/19/2014. Today patient is wearing existing diabetic shoe with custom insole. We are waiting for certification for diabetic shoes. Dr. Loanne Drilling would not completely sign certification for diabetic shoes and we are trying to get certification for other M.D. treating patient for diabetes.  Objective: Hemorrhagic plantar keratoses left first MPJ that has fragile closure Existing diabetic insole is in good state of repair  Assessment: Pre-ulcerative plantar keratoses Diabetic for neuropathy  Plan: Debrided bleeding callus plantar aspect left foot I attached an additional surgical felt pad to offload the plantar left first MPJ on the custom insole  Reappoint 2 weeks

## 2014-06-13 DIAGNOSIS — D225 Melanocytic nevi of trunk: Secondary | ICD-10-CM | POA: Diagnosis not present

## 2014-06-13 DIAGNOSIS — Z08 Encounter for follow-up examination after completed treatment for malignant neoplasm: Secondary | ICD-10-CM | POA: Diagnosis not present

## 2014-06-13 DIAGNOSIS — Z85820 Personal history of malignant melanoma of skin: Secondary | ICD-10-CM | POA: Diagnosis not present

## 2014-06-13 DIAGNOSIS — L814 Other melanin hyperpigmentation: Secondary | ICD-10-CM | POA: Diagnosis not present

## 2014-06-13 DIAGNOSIS — L820 Inflamed seborrheic keratosis: Secondary | ICD-10-CM | POA: Diagnosis not present

## 2014-06-18 ENCOUNTER — Ambulatory Visit: Payer: Medicare Other | Admitting: Podiatry

## 2014-06-19 ENCOUNTER — Other Ambulatory Visit: Payer: Self-pay | Admitting: Family Medicine

## 2014-06-19 DIAGNOSIS — S22000A Wedge compression fracture of unspecified thoracic vertebra, initial encounter for closed fracture: Secondary | ICD-10-CM

## 2014-06-25 ENCOUNTER — Encounter: Payer: Self-pay | Admitting: Podiatry

## 2014-06-25 ENCOUNTER — Ambulatory Visit (INDEPENDENT_AMBULATORY_CARE_PROVIDER_SITE_OTHER): Payer: Medicare Other | Admitting: Podiatry

## 2014-06-25 DIAGNOSIS — L84 Corns and callosities: Secondary | ICD-10-CM

## 2014-06-25 DIAGNOSIS — E1142 Type 2 diabetes mellitus with diabetic polyneuropathy: Secondary | ICD-10-CM | POA: Diagnosis not present

## 2014-06-25 DIAGNOSIS — G629 Polyneuropathy, unspecified: Secondary | ICD-10-CM | POA: Diagnosis not present

## 2014-06-25 NOTE — Patient Instructions (Signed)
We are referring you to [facility] for diabetic shoes with custom insoles  Diabetes and Foot Care Diabetes Wangerin cause you to have problems because of poor blood supply (circulation) to your feet and legs. This Loflin cause the skin on your feet to become thinner, break easier, and heal more slowly. Your skin Frerking become dry, and the skin Schwebach peel and crack. You Coultas also have nerve damage in your legs and feet causing decreased feeling in them. You Millirons not notice minor injuries to your feet that could lead to infections or more serious problems. Taking care of your feet is one of the most important things you can do for yourself.  HOME CARE INSTRUCTIONS  Wear shoes at all times, even in the house. Do not go barefoot. Bare feet are easily injured.  Check your feet daily for blisters, cuts, and redness. If you cannot see the bottom of your feet, use a mirror or ask someone for help.  Wash your feet with warm water (do not use hot water) and mild soap. Then pat your feet and the areas between your toes until they are completely dry. Do not soak your feet as this can dry your skin.  Apply a moisturizing lotion or petroleum jelly (that does not contain alcohol and is unscented) to the skin on your feet and to dry, brittle toenails. Do not apply lotion between your toes.  Trim your toenails straight across. Do not dig under them or around the cuticle. File the edges of your nails with an emery board or nail file.  Do not cut corns or calluses or try to remove them with medicine.  Wear clean socks or stockings every day. Make sure they are not too tight. Do not wear knee-high stockings since they Christenberry decrease blood flow to your legs.  Wear shoes that fit properly and have enough cushioning. To break in new shoes, wear them for just a few hours a day. This prevents you from injuring your feet. Always look in your shoes before you put them on to be sure there are no objects inside.  Do not cross your legs. This  Jurewicz decrease the blood flow to your feet.  If you find a minor scrape, cut, or break in the skin on your feet, keep it and the skin around it clean and dry. These areas Burby be cleansed with mild soap and water. Do not cleanse the area with peroxide, alcohol, or iodine.  When you remove an adhesive bandage, be sure not to damage the skin around it.  If you have a wound, look at it several times a day to make sure it is healing.  Do not use heating pads or hot water bottles. They Wendorf burn your skin. If you have lost feeling in your feet or legs, you Ogarro not know it is happening until it is too late.  Make sure your health care provider performs a complete foot exam at least annually or more often if you have foot problems. Report any cuts, sores, or bruises to your health care provider immediately. SEEK MEDICAL CARE IF:   You have an injury that is not healing.  You have cuts or breaks in the skin.  You have an ingrown nail.  You notice redness on your legs or feet.  You feel burning or tingling in your legs or feet.  You have pain or cramps in your legs and feet.  Your legs or feet are numb.  Your feet always  feel cold. SEEK IMMEDIATE MEDICAL CARE IF:   There is increasing redness, swelling, or pain in or around a wound.  There is a red line that goes up your leg.  Pus is coming from a wound.  You develop a fever or as directed by your health care provider.  You notice a bad smell coming from an ulcer or wound. Document Released: 04/17/2000 Document Revised: 12/21/2012 Document Reviewed: 09/27/2012 [facility] Patient Information 2015 [place], [place]. This information is not intended to replace advice given to you by your health care provider. Make sure you discuss any questions you have with your health care provider.

## 2014-06-25 NOTE — Progress Notes (Signed)
Patient ID: [name], female   DOB: 04/08/1930, 81 y.o.   MRN: [id number]  Subjective: This patient again says for follow-up care for the ulcer plantar callus sub-left first MPJ under care since 02/19/2014. She is currently wearing a diabetic shoe with custom insoles. We have attempted to contact her primary care physician Dr. Loanne Drilling and Dr. Ernie Hew for certification, both of whom have not completed correctly the submitted form and therefore were unable to certify her shoes  Objective: Hemorrhagic plantar keratoses sub-left first MPJ Existing diabetic shoes with custom insoles were modified again today with attaching a felt pad to offload the left first MPJ  Assessment: Pre-ulcerative plantar keratoses sub-left first MPJ Diabetic peripheral neuropathy  Plan: Debride hemorrhagic plantar keratoses left Attach additional felt pad on existing custom insole on left first MPJ ot Referred patient to [facility] for diabetic shoes with Plastizote insoles to offload the left first MPJ for the indication of diabetic neuropathy and pre-ulcerative callus  Reappoint 3 weeks

## 2014-06-26 DIAGNOSIS — M179 Osteoarthritis of knee, unspecified: Secondary | ICD-10-CM | POA: Diagnosis not present

## 2014-06-26 DIAGNOSIS — N952 Postmenopausal atrophic vaginitis: Secondary | ICD-10-CM | POA: Diagnosis not present

## 2014-06-26 DIAGNOSIS — N390 Urinary tract infection, site not specified: Secondary | ICD-10-CM | POA: Diagnosis not present

## 2014-06-26 DIAGNOSIS — I10 Essential (primary) hypertension: Secondary | ICD-10-CM | POA: Diagnosis not present

## 2014-07-09 ENCOUNTER — Ambulatory Visit
Admission: RE | Admit: 2014-07-09 | Discharge: 2014-07-09 | Disposition: A | Discharge status: Home / Self Care | Payer: Medicare Other | Source: Ambulatory Visit | Attending: Family Medicine | Admitting: Family Medicine

## 2014-07-09 DIAGNOSIS — M4854XA Collapsed vertebra, not elsewhere classified, thoracic region, initial encounter for fracture: Secondary | ICD-10-CM | POA: Diagnosis not present

## 2014-07-09 DIAGNOSIS — S22000A Wedge compression fracture of unspecified thoracic vertebra, initial encounter for closed fracture: Secondary | ICD-10-CM

## 2014-07-09 DIAGNOSIS — Z853 Personal history of malignant neoplasm of breast: Secondary | ICD-10-CM | POA: Diagnosis not present

## 2014-07-16 ENCOUNTER — Ambulatory Visit (INDEPENDENT_AMBULATORY_CARE_PROVIDER_SITE_OTHER): Payer: Medicare Other | Admitting: Podiatry

## 2014-07-16 ENCOUNTER — Encounter: Payer: Self-pay | Admitting: Podiatry

## 2014-07-16 ENCOUNTER — Other Ambulatory Visit (HOSPITAL_COMMUNITY): Payer: Self-pay | Admitting: Family Medicine

## 2014-07-16 VITALS — BP 169/90 | HR 69 | Resp 12

## 2014-07-16 DIAGNOSIS — E1142 Type 2 diabetes mellitus with diabetic polyneuropathy: Secondary | ICD-10-CM | POA: Diagnosis not present

## 2014-07-16 DIAGNOSIS — G629 Polyneuropathy, unspecified: Secondary | ICD-10-CM | POA: Diagnosis not present

## 2014-07-16 DIAGNOSIS — IMO0002 Reserved for concepts with insufficient information to code with codable children: Secondary | ICD-10-CM

## 2014-07-16 DIAGNOSIS — M546 Pain in thoracic spine: Secondary | ICD-10-CM

## 2014-07-16 DIAGNOSIS — L84 Corns and callosities: Secondary | ICD-10-CM

## 2014-07-16 NOTE — Progress Notes (Signed)
Patient ID: Karli Wickizer Folse, female   DOB: 1930-04-07, 81 y.o.   MRN: [id number]  Subjective: This patient presents for follow-up care for pre-ulcerative plantar callus sub-left first MPJ under our care since 02/19/2014. Diabetic shoes certification was not obtained from Dr. Loanne Drilling or Dr. Ernie Hew. Patient was referred to [facility] for diabetic shoes.  Objective: Hemorrhagic plantar keratoses of left first MPJ that remains closed after debridement Existing diabetic shoes with custom insoles were modified with a felt pad to offload the left first MPJ  Assessment: Pre-ulcerative plantar keratoses sub-left first MPJ Diabetic peripheral neuropathy  Plan: Debridement of hemorrhagic plantar keratoses left Maintain existing diabetic shoes with insoles and dancers pad attached to the insole left offload the left first MPJ  I informed patient that both physicians would not sign certification for the diabetic shoes and I have referred patient to [facility] for diabetic shoes with Plastizote insoles to offload the left first MPJ for the indication of diabetic neuropathy and pre-ulcerative callus. Patient again seems confused about the refusal of the two above physicians would not sign a certification for diabetic shoes. I explained to her that I could not dispense shoes unless they would sign the certification and that was the reason I referred to [facility].  Reappoint 4 weeks

## 2014-07-16 NOTE — Patient Instructions (Signed)
Diabetes and Foot Care Diabetes Rode cause you to have problems because of poor blood supply (circulation) to your feet and legs. This Gsell cause the skin on your feet to become thinner, break easier, and heal more slowly. Your skin Dunson become dry, and the skin Wehmeyer peel and crack. You Rayner also have nerve damage in your legs and feet causing decreased feeling in them. You Tamm not notice minor injuries to your feet that could lead to infections or more serious problems. Taking care of your feet is one of the most important things you can do for yourself.  HOME CARE INSTRUCTIONS  Wear shoes at all times, even in the house. Do not go barefoot. Bare feet are easily injured.  Check your feet daily for blisters, cuts, and redness. If you cannot see the bottom of your feet, use a mirror or ask someone for help.  Wash your feet with warm water (do not use hot water) and mild soap. Then pat your feet and the areas between your toes until they are completely dry. Do not soak your feet as this can dry your skin.  Apply a moisturizing lotion or petroleum jelly (that does not contain alcohol and is unscented) to the skin on your feet and to dry, brittle toenails. Do not apply lotion between your toes.  Trim your toenails straight across. Do not dig under them or around the cuticle. File the edges of your nails with an emery board or nail file.  Do not cut corns or calluses or try to remove them with medicine.  Wear clean socks or stockings every day. Make sure they are not too tight. Do not wear knee-high stockings since they Bradstreet decrease blood flow to your legs.  Wear shoes that fit properly and have enough cushioning. To break in new shoes, wear them for just a few hours a day. This prevents you from injuring your feet. Always look in your shoes before you put them on to be sure there are no objects inside.  Do not cross your legs. This Mandt decrease the blood flow to your feet.  If you find a minor scrape,  cut, or break in the skin on your feet, keep it and the skin around it clean and dry. These areas Dowler be cleansed with mild soap and water. Do not cleanse the area with peroxide, alcohol, or iodine.  When you remove an adhesive bandage, be sure not to damage the skin around it.  If you have a wound, look at it several times a day to make sure it is healing.  Do not use heating pads or hot water bottles. They Lamons burn your skin. If you have lost feeling in your feet or legs, you Hoselton not know it is happening until it is too late.  Make sure your health care provider performs a complete foot exam at least annually or more often if you have foot problems. Report any cuts, sores, or bruises to your health care provider immediately. SEEK MEDICAL CARE IF:   You have an injury that is not healing.  You have cuts or breaks in the skin.  You have an ingrown nail.  You notice redness on your legs or feet.  You feel burning or tingling in your legs or feet.  You have pain or cramps in your legs and feet.  Your legs or feet are numb.  Your feet always feel cold. SEEK IMMEDIATE MEDICAL CARE IF:   There is increasing redness,   swelling, or pain in or around a wound.  There is a red line that goes up your leg.  Pus is coming from a wound.  You develop a fever or as directed by your health care provider.  You notice a bad smell coming from an ulcer or wound. Document Released: 04/17/2000 Document Revised: 12/21/2012 Document Reviewed: 09/27/2012 [employer] Patient Information 2015 [employer]. This information is not intended to replace advice given to you by your health care provider. Make sure you discuss any questions you have with your health care provider.  

## 2014-07-17 ENCOUNTER — Encounter: Payer: Self-pay | Admitting: Endocrinology

## 2014-07-17 ENCOUNTER — Ambulatory Visit (INDEPENDENT_AMBULATORY_CARE_PROVIDER_SITE_OTHER): Payer: Medicare Other | Admitting: Endocrinology

## 2014-07-17 VITALS — BP 156/90 | HR 101 | Temp 97.6°F | Ht 67.0 in | Wt 132.0 lb

## 2014-07-17 DIAGNOSIS — E1151 Type 2 diabetes mellitus with diabetic peripheral angiopathy without gangrene: Secondary | ICD-10-CM

## 2014-07-17 DIAGNOSIS — E042 Nontoxic multinodular goiter: Secondary | ICD-10-CM

## 2014-07-17 LAB — MICROALBUMIN / CREATININE URINE RATIO
Creatinine,U: 120.3 mg/dL
Microalb Creat Ratio: 3.9 mg/g (ref 0.0–30.0)
Microalb, Ur: 4.7 mg/dL — ABNORMAL HIGH (ref 0.0–1.9)

## 2014-07-17 LAB — HEMOGLOBIN A1C: Hgb A1c MFr Bld: 7.1 % — ABNORMAL HIGH (ref 4.6–6.5)

## 2014-07-17 LAB — TSH: TSH: 3.83 u[IU]/mL (ref 0.35–4.50)

## 2014-07-17 MED ORDER — NATEGLINIDE 120 MG PO TABS: 120.0000 mg | ORAL_TABLET | Freq: Three times a day (TID) | ORAL | Status: DC

## 2014-07-17 NOTE — Patient Instructions (Addendum)
check your blood sugar once a day.  vary the time of day when you check, between before the 3 meals, and at bedtime.  also check if you have symptoms of your blood sugar being too high or too low.  please keep a record of the readings and bring it to your next appointment here.  You can write it on any piece of paper.  please call us sooner if your blood sugar goes below 70, or if you have a lot of readings over 200. blood tests are being requested for you today.  We'll let you know about the results.  If it is high, we can double the nateglinide, or add "invokana."  Please come back for a follow-up appointment in 3 months.   please call [phone] to get an appointment with a new primary doctor.

## 2014-07-17 NOTE — Progress Notes (Signed)
Subjective:    Patient ID: [name], female    DOB: [birth year], 81 y.o.   MRN: [id number]  HPI Pt returns for f/u of multinodular goiter (pt had a syncopal episode in April of 2015; she was eval with carotid US, and there was incidentally noted to have nodules in the thyroid; she is euthyroid; bx was not done, due to advanced age and FHx of benign goiter). She does not notice the nodules.  Pt returns for f/u of diabetes mellitus:  DM type: 2 Dx'ed: 7564 Complications: polyneuropathy and PAD Therapy: 2 oral meds DKA: never Severe hypoglycemia: never.   Pancreatitis: never.   Other: she took insulin for only a brief time after dx; she declines [place].  Interval history:  She does not notice the goiter.  She has lost weight. she brings a record of her cbg's which i have reviewed today.  It varies from 153-290.  There is no trend throughout the day.   She will have vertebroplasty in 2 days, but no steroid injection.   Past Medical History  Diagnosis Date  . Seasonal allergies   . HTN (hypertension)   . Osteoarthritis   . Palpitations   . Anemia   . History of colonic polyps   . GERD (gastroesophageal reflux disease)   . Hyperlipidemia   . Chronic facial pain   . Vitamin B12 deficiency   . Dyslipidemia   . Chronic foot pain   . Chronic cough   . Obesity   . Anxiety   . Complication of anesthesia     Sore jaw; could not chew or move mouth  . Diabetes mellitus     type 2 niddm x 20 years  . Metatarsal bone fracture 2014  . Hammer toe     bilateral  . Hx of radiation therapy 03/07/13- 03/29/13    right breast 4250 cGy 17 sessions  . Ejection fraction   . Skin cancer   . Melanoma   . 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 Colonoscopy    . Polypectomy      small adenomatous  . Abdominal hysterectomy    . Cholecystectomy    . Hernia repair    . Knee arthroscopy Bilateral   . Ulnar  tunnel release    . Eye surgery    . Cataract extraction w/ intraocular lens implant Right   . Bunionectomy    . Breast lumpectomy with needle localization Right 01/17/2013    Procedure: BREAST LUMPECTOMY WITH NEEDLE LOCALIZATION;  Surgeon: Edward Jolly, MD;  Location: [facility];  Service: General;  Laterality: Right;  . Breast biopsy Right 01/24/2013    Procedure: RE-EXCICION OF BREAST CANCER, ANTERIOR MARGINS;  Surgeon: Edward Jolly, MD;  Location: [facility];  Service: General;  Laterality: Right;  . Craniotomy Right 10/18/2013    Procedure: CRANIOTOMY TUMOR EXCISION;  Surgeon: Floyce Stakes, MD;  Location: [facility];  Service: Neurosurgery;  Laterality: Right;    History   Social History  . Marital Status: Widowed    Spouse Name: N/A  . Number of Children: 0  . Years of Education: N/A   Occupational History  . retired    Social History Main Topics  . Smoking status: Never Smoker   . Smokeless tobacco: Not on file  . Alcohol Use: No  . Drug Use: No  . Sexual Activity: Yes  Comment: menarche age 8, hysterectomy age 26, HRT x 2-3 mos, G1- miscarriage   Other Topics Concern  . Not on file   Social History Narrative   Widowed, lives alone.  Ambulates independently.     Current Outpatient Prescriptions on File Prior to Visit  Medication Sig Dispense Refill  . acetaminophen (TYLENOL) 500 MG tablet Take 1,000 mg by mouth every 6 (six) hours as needed for pain.    Marland Kitchen ALPRAZolam (XANAX) 0.25 MG tablet Take 0.25 mg by mouth at bedtime.     . calcium carbonate (OS-CAL) 600 MG TABS Take 600 mg by mouth daily.     . cholecalciferol (VITAMIN D) 1000 UNITS tablet Take 1,000 Units by mouth daily.     . colestipol (COLESTID) 1 G tablet Take 2 g by mouth 2 (two) times daily.    . ferrous sulfate 325 (65 FE) MG tablet Take 325 mg by mouth daily with breakfast.     . fexofenadine (ALLEGRA) 60 MG tablet Take 60 mg by mouth daily.     . fish oil-omega-3 fatty acids 1000 MG  capsule Take 1 g by mouth daily.     . furosemide (LASIX) 20 MG tablet Take 1 tablet (20 mg total) by mouth daily. Do not take until I call you about labs. (Patient taking differently: Take 20 mg by mouth daily as needed for fluid or edema. Do not take until I call you about labs.) 30 tablet 0  . lidocaine (LIDODERM) 5 % Place 1 patch onto the skin daily. Remove & Discard patch within 12 hours or as directed by MD (Patient taking differently: Place 1 patch onto the skin daily as needed (for pain). Remove & Discard patch within 12 hours or as directed by MD) 90 patch 1  . metFORMIN (GLUCOPHAGE-XR) 500 MG 24 hr tablet 4 tabs daily (Patient taking differently: Take 500 mg by mouth 4 (four) times daily -  with meals and at bedtime. ) 360 tablet 3  . ranitidine (ZANTAC) 150 MG capsule Take 150 mg by mouth daily as needed for heartburn.     Marland Kitchen TARKA 2-180 MG per tablet TAKE 1 TABLET DAILY 90 tablet 1   Current Facility-Administered Medications on File Prior to Visit  Medication Dose Route Frequency Provider Last Rate Last Dose  . cyanocobalamin ((VITAMIN B-12)) injection 1,000 mcg  1,000 mcg Intramuscular Q30 days Ricard Dillon, MD   1,000 mcg at 02/13/11 8338    Allergies  Allergen Reactions  . Bystolic [Nebivolol Hcl] Other (See Comments)    "extreme weakness, heaviness in legs & arms, swelling in legs/arms/face, swollen abdomen, pain in bladder, feet pain, soreness in chest"  . Cholestyramine     "itching rash on stomach, bloated, nausea, vomiting, sleeplessness, extreme pain in arms"  . Hydrazine Yellow [Tartrazine] Other (See Comments)    "does not reduce high blood pressure, pain in arm, high pressure, felt like I was on verge of heart attack, really weak"  . Morphine Other (See Comments)    Feels morbid, weak, still in pain  . Niacin Palpitations    Fast heart beat  . Niaspan [Niacin Er] Palpitations and Other (See Comments)    "fast heart beat, high blood pressure"  . Norvasc [Amlodipine  Besylate] Other (See Comments)    "extreme fluid retention/pain)  . Optivar [Azelastine Hcl] Photosensitivity  . Sular [Nisoldipine Er] Other (See Comments)    "severe headaches, swelling eyes, hands, feet, shortness of breath, weak, flushed face, brain boiling, fluid  retention, high blood sugar, nervous, heart fast beating"  . Tegretol [Carbamazepine] Other (See Comments)    Blood poisoning   . Telmisartan Other (See Comments)    "headache, difficulty urinating, high blood sugar, fluid retention"  . Cefdinir Swelling    Vaginal irritation, breathing,   . Clonidine Other (See Comments)    Dry mouth, fluid retention  . Clonidine Hydrochloride Other (See Comments)    Dry mouth, fluid retention  . Codeine Nausea And Vomiting  . Ezetimibe Other (See Comments)    Made weak  . Naproxen Other (See Comments)    Shrinks bladder  . Ziac [Bisoprolol-Hydrochlorothiazide] Other (See Comments)    "stopped urination"  . Elavil [Amitriptyline] Other (See Comments)    Gave Pt nightmares  . Keppra [Levetiracetam]     Shaking  . Lyrica [Pregabalin] Swelling  . Ace Inhibitors Other (See Comments)    unknown  . Actonel [Risedronate Sodium] Other (See Comments)    unknown  . Amlodipine Besylate Other (See Comments)    unknown  . Aspirin Other (See Comments)    unknown  . Atacand [Candesartan] Other (See Comments)    unknown  . Bextra [Valdecoxib] Other (See Comments)    unknown  . Bisoprolol-Hydrochlorothiazide Other (See Comments)    unknown  . Candesartan Cilexetil Other (See Comments)    unknown  . Cefadroxil Other (See Comments)    unknown  . Celecoxib Rash  . Hydrocodone Other (See Comments)    unknown  . Hydrocodone-Acetaminophen Other (See Comments)    unknown  . Iodinated Diagnostic Agents Rash    "All over"  . Meloxicam Other (See Comments)    unknown  . Methylprednisolone Sodium Succinate Other (See Comments)    unknown  . Nabumetone Other (See Comments)    unknown  .  Penicillins Other (See Comments)    unknown  . Pseudoephedrine-Guaifenesin Other (See Comments)    unknown  . Risedronate Sodium Other (See Comments)    unknown  . Rofecoxib Other (See Comments)    unknown  . Ru-Tuss [Chlorphen-Pse-Atrop-Hyos-Scop] Other (See Comments)    unknown  . Sulfonamide Derivatives Other (See Comments)    unknown  . Sulphur [Sulfur] Other (See Comments)    unknown  . Telithromycin Other (See Comments)    unknown  . Terfenadine Other (See Comments)    unknown  . Trandolapril-Verapamil Hcl Er Other (See Comments)    Headache, difficulty urinating, high blood sugar, fluid retention  . Valium [Diazepam] Other (See Comments)    Makes her mean and hyper    Family History  Problem Relation Age of Onset  . Colon cancer    . Heart disease Mother   . Diabetes Father   . Pancreatic cancer Father   . Bone cancer Sister   . Prostate cancer Brother   . Colon cancer Brother   . Rectal cancer Sister   . Thyroid disease Sister     benign goiter resected    BP 156/90 mmHg  Pulse 101  Temp(Src) 97.6 F (36.4 C) (Oral)  Ht 5\' 7"  (1.702 m)  Wt 132 lb (59.875 kg)  BMI 20.67 kg/m2  SpO2 96%  Review of Systems She denies hypoglycemia and weight change    Objective:   Physical Exam VITAL SIGNS:  See vs page GENERAL: no distress Pulses: dorsalis pedis intact bilat.   MSK: no deformity of the feet, except for a few hammer toes.   CV: no leg edema Skin:  no ulcer on the feet.  normal color and temp on the feet. Neuro: sensation is intact to touch on the feet.     Lab Results  Component Value Date   HGBA1C 7.1* 07/17/2014   Lab Results  Component Value Date   TSH 3.83 07/17/2014      Assessment & Plan:  DM: slightly worse Multinodular goiter: stable. euthyroid.  We'll follow [place] and TSH.   Patient is advised the following: Patient Instructions  check your blood sugar once a day.  vary the time of day when you check, between before the 3 meals,  and at bedtime.  also check if you have symptoms of your blood sugar being too high or too low.  please keep a record of the readings and bring it to your next appointment here.  You can write it on any piece of paper.  please call us sooner if your blood sugar goes below 70, or if you have a lot of readings over 200. blood tests are being requested for you today.  We'll let you know about the results.  If it is high, we can double the nateglinide, or add "invokana."  Please come back for a follow-up appointment in 3 months.   please call [phone] to get an appointment with a new primary doctor.     addendum: double the nateglinide

## 2014-07-18 ENCOUNTER — Other Ambulatory Visit: Payer: Self-pay | Admitting: Radiology

## 2014-07-18 ENCOUNTER — Other Ambulatory Visit (HOSPITAL_COMMUNITY): Payer: TRICARE For Life (TFL)

## 2014-07-19 ENCOUNTER — Ambulatory Visit (HOSPITAL_COMMUNITY)
Admission: RE | Admit: 2014-07-19 | Discharge: 2014-07-19 | Disposition: A | Discharge status: Home / Self Care | Payer: Medicare Other | Source: Ambulatory Visit | Attending: Interventional Radiology | Admitting: Interventional Radiology

## 2014-07-19 ENCOUNTER — Ambulatory Visit (HOSPITAL_COMMUNITY)
Admission: RE | Admit: 2014-07-19 | Discharge: 2014-07-19 | Disposition: A | Discharge status: Home / Self Care | Payer: Medicare Other | Source: Ambulatory Visit | Attending: Family Medicine | Admitting: Family Medicine

## 2014-07-19 ENCOUNTER — Encounter (HOSPITAL_COMMUNITY): Payer: Self-pay

## 2014-07-19 DIAGNOSIS — E119 Type 2 diabetes mellitus without complications: Secondary | ICD-10-CM | POA: Insufficient documentation

## 2014-07-19 DIAGNOSIS — S22088A Other fracture of T11-T12 vertebra, initial encounter for closed fracture: Secondary | ICD-10-CM | POA: Diagnosis not present

## 2014-07-19 DIAGNOSIS — M546 Pain in thoracic spine: Secondary | ICD-10-CM

## 2014-07-19 DIAGNOSIS — M4854XA Collapsed vertebra, not elsewhere classified, thoracic region, initial encounter for fracture: Secondary | ICD-10-CM | POA: Diagnosis not present

## 2014-07-19 DIAGNOSIS — Z85828 Personal history of other malignant neoplasm of skin: Secondary | ICD-10-CM | POA: Insufficient documentation

## 2014-07-19 DIAGNOSIS — Z79899 Other long term (current) drug therapy: Secondary | ICD-10-CM | POA: Diagnosis not present

## 2014-07-19 DIAGNOSIS — F419 Anxiety disorder, unspecified: Secondary | ICD-10-CM | POA: Diagnosis not present

## 2014-07-19 DIAGNOSIS — IMO0002 Reserved for concepts with insufficient information to code with codable children: Secondary | ICD-10-CM | POA: Insufficient documentation

## 2014-07-19 DIAGNOSIS — Z853 Personal history of malignant neoplasm of breast: Secondary | ICD-10-CM | POA: Diagnosis not present

## 2014-07-19 DIAGNOSIS — D649 Anemia, unspecified: Secondary | ICD-10-CM | POA: Insufficient documentation

## 2014-07-19 DIAGNOSIS — E538 Deficiency of other specified B group vitamins: Secondary | ICD-10-CM | POA: Diagnosis not present

## 2014-07-19 DIAGNOSIS — M1990 Unspecified osteoarthritis, unspecified site: Secondary | ICD-10-CM | POA: Insufficient documentation

## 2014-07-19 DIAGNOSIS — I10 Essential (primary) hypertension: Secondary | ICD-10-CM | POA: Diagnosis not present

## 2014-07-19 DIAGNOSIS — K219 Gastro-esophageal reflux disease without esophagitis: Secondary | ICD-10-CM | POA: Insufficient documentation

## 2014-07-19 DIAGNOSIS — E669 Obesity, unspecified: Secondary | ICD-10-CM | POA: Insufficient documentation

## 2014-07-19 DIAGNOSIS — Z9079 Acquired absence of other genital organ(s): Secondary | ICD-10-CM | POA: Insufficient documentation

## 2014-07-19 DIAGNOSIS — E785 Hyperlipidemia, unspecified: Secondary | ICD-10-CM | POA: Diagnosis not present

## 2014-07-19 DIAGNOSIS — W19XXXA Unspecified fall, initial encounter: Secondary | ICD-10-CM | POA: Insufficient documentation

## 2014-07-19 DIAGNOSIS — M549 Dorsalgia, unspecified: Secondary | ICD-10-CM | POA: Insufficient documentation

## 2014-07-19 DIAGNOSIS — Z9049 Acquired absence of other specified parts of digestive tract: Secondary | ICD-10-CM | POA: Diagnosis not present

## 2014-07-19 DIAGNOSIS — J302 Other seasonal allergic rhinitis: Secondary | ICD-10-CM | POA: Insufficient documentation

## 2014-07-19 LAB — CBC WITH DIFFERENTIAL/PLATELET
Basophils Absolute: 0 10*3/uL (ref 0.0–0.1)
Basophils Relative: 0 % (ref 0–1)
Eosinophils Absolute: 0 10*3/uL (ref 0.0–0.7)
Eosinophils Relative: 0 % (ref 0–5)
HCT: 34.7 % — ABNORMAL LOW (ref 36.0–46.0)
Hemoglobin: 11.4 g/dL — ABNORMAL LOW (ref 12.0–15.0)
Lymphocytes Relative: 18 % (ref 12–46)
Lymphs Abs: 1.5 10*3/uL (ref 0.7–4.0)
MCH: 30 pg (ref 26.0–34.0)
MCHC: 32.9 g/dL (ref 30.0–36.0)
MCV: 91.3 fL (ref 78.0–100.0)
Monocytes Absolute: 0.6 10*3/uL (ref 0.1–1.0)
Monocytes Relative: 7 % (ref 3–12)
Neutro Abs: 6.3 10*3/uL (ref 1.7–7.7)
Neutrophils Relative %: 75 % (ref 43–77)
Platelets: 258 10*3/uL (ref 150–400)
RBC: 3.8 MIL/uL — ABNORMAL LOW (ref 3.87–5.11)
RDW: 13.7 % (ref 11.5–15.5)
WBC: 8.4 10*3/uL (ref 4.0–10.5)

## 2014-07-19 LAB — PROTIME-INR
INR: 1 (ref 0.00–1.49)
Prothrombin Time: 13.3 seconds (ref 11.6–15.2)

## 2014-07-19 LAB — GLUCOSE, CAPILLARY: Glucose-Capillary: 153 mg/dL — ABNORMAL HIGH (ref 70–99)

## 2014-07-19 MED ORDER — FENTANYL CITRATE 0.05 MG/ML IJ SOLN
INTRAMUSCULAR | Status: AC
  Filled 2014-07-19: qty 6

## 2014-07-19 MED ORDER — LIDOCAINE HCL (PF) 1 % IJ SOLN
INTRAMUSCULAR | Status: AC
  Filled 2014-07-19: qty 30

## 2014-07-19 MED ORDER — FLUMAZENIL 0.5 MG/5ML IV SOLN
INTRAVENOUS | Status: AC
  Filled 2014-07-19: qty 5

## 2014-07-19 MED ORDER — MIDAZOLAM HCL 2 MG/2ML IJ SOLN
INTRAMUSCULAR | Status: AC
  Filled 2014-07-19: qty 6

## 2014-07-19 MED ORDER — VANCOMYCIN HCL IN DEXTROSE 1-5 GM/200ML-% IV SOLN
1000.0000 mg | Freq: Once | INTRAVENOUS | Status: AC
  Administered 2014-07-19: 1000 mg via INTRAVENOUS
  Filled 2014-07-19: qty 200

## 2014-07-19 MED ORDER — SODIUM CHLORIDE 0.9 % IV SOLN
INTRAVENOUS | Status: DC
  Administered 2014-07-19: 09:00:00 via INTRAVENOUS

## 2014-07-19 MED ORDER — FENTANYL CITRATE 0.05 MG/ML IJ SOLN
INTRAMUSCULAR | Status: AC | PRN
  Administered 2014-07-19: 50 ug via INTRAVENOUS
  Administered 2014-07-19 (×2): 25 ug via INTRAVENOUS

## 2014-07-19 MED ORDER — ACETAMINOPHEN 325 MG PO TABS
650.0000 mg | ORAL_TABLET | Freq: Four times a day (QID) | ORAL | Status: DC | PRN
Start: 2014-07-19 — End: 2014-07-20
  Administered 2014-07-19: 650 mg via ORAL
  Filled 2014-07-19: qty 2

## 2014-07-19 MED ORDER — NALOXONE HCL 0.4 MG/ML IJ SOLN
INTRAMUSCULAR | Status: AC
  Filled 2014-07-19: qty 1

## 2014-07-19 MED ORDER — MIDAZOLAM HCL 2 MG/2ML IJ SOLN
INTRAMUSCULAR | Status: AC | PRN
  Administered 2014-07-19: 0.5 mg via INTRAVENOUS
  Administered 2014-07-19: 1 mg via INTRAVENOUS
  Administered 2014-07-19: 0.5 mg via INTRAVENOUS

## 2014-07-19 NOTE — Procedures (Signed)
Kyphoplasty and bone biopsy thoracic R48 No complication No blood loss. See complete dictation in [facility]

## 2014-07-19 NOTE — Discharge Instructions (Addendum)
Call [facility] Radiology for any problems or Questions. [phone]  Balloon Kyphoplasty Balloon kyphoplasty is a procedure in which orthopedic balloons are used to gently raise a collapsed vertebral body in an attempt to alleviate pain. This condition is also called a vertebral compression fracture, or VCF. Most often the cause of this collapse is due to osteoporosis of the vertebral body, which makes the bone susceptible to minor trauma. Osteoporosis is a condition that comes on with aging. Osteoporosis is due to a loss of mineral from the bone. This causes a softening of the bones. The diagnosis of these fractures is usually made with X-rays or magnetic resonance imaging (MRI). Some advantages of this procedure are:  Pain relief.  Restoration of vertebral body height.  Correction of spinal deformity.  Relatively low complication rate. The procedure is usually done by orthopedic surgeons, neurosurgeons, interventional radiologists, and interventional neuroradiologists who specialize in treating the spine with balloon kyphoplasty.  This procedure is not useful for:  Patients with young, healthy bones or those who have sustained a vertebral body fracture or collapse in a major accident.  Patients with spinal curvature such as scoliosis or kyphosis that is due to causes other than osteoporosis.  Patients who suffer from spinal stenosis or herniated discs with nerve or spinal cord compression and loss of neurological function not associated with a vertebral compression fracture.  Patients with known metastatic disease of the spine. THE BENEFITS OF BALLOON KYPHOPLASTY INCLUDE:  Reduction in back pain. If there is pain due to the procedure, it will typically lessen within two weeks.  Improved quality of life.  Improved mobility (you can get around better).  Improved ability to perform activities of daily living. PROCEDURE  The kyphoplasty procedure involves the use of a balloon to restore  the vertebral body height and shape. This is followed by placement of bone cement to strengthen it. The procedure Chisom be done under intravenous sedation, local anesthetic, or general anesthetic. The patient lies face-down on the operating room table. X-ray machines are used to show the collapsed bones.  The surgeon makes two small incisions. A tube is then inserted into the center of the vertebral body. Through this tube, balloons are placed in the vertebral body. Then the balloons are inflated. This creates a cavity. This pushes the bone back toward its normal height and shape.  Once the cavity is created, the surgeon removes the inflatable balloon. The cement is mixed and used to fill the cavity in a slow and controlled fashion. The cement hardens. Then the surgeon takes out the tubes. The incisions are closed with a single stitch. Patients usually go home the same day. Patients can go back to all normal activities of daily living as soon as possible. There are no restrictions.  RISKS AND COMPLICATIONS As with any surgery, there are potential risks. Although the procedure is designed to minimize risks, complications [place] occur. Be sure to discuss the risks with your caregiver. Balloon kyphoplasty is not right for all patients. Complications Kamer require more treatments. Complications that can occur include:  The usual risks of local or general anesthetics apply. These risks depend on the patient's overall health.  Heart attack (myocardial infarction).  Stroke (cerebrovascular accident).  A blockage in the lung (pulmonary embolism - there is a very small chance of the cement traveling to lungs).  There is a small risk of the bone cement leaking from within the boundaries of the vertebral body. In most cases, this rare event does not  cause any problems. However, if the cement does leak it Stenner cause:  Pain.  Altered sensation.  Very rarely, paralysis.  Should the cement leak further, more  significant surgery Dinunzio be needed to stop the irritation of the nerves or spinal cord.  In very rare circumstances, the cement Diguglielmo irritate or damage the spinal cord or nerves.  Heart stops beating (cardiac arrest).  Excessive bleeding (hemorrhage).  Infection (there is a small chance of the cement block becoming infected at the time of surgery or even years later). Document Released: 03/26/2004 Document Revised: 09/04/2013 Document Reviewed: 10/30/2008 [facility] Patient Information 2015 [place], [place]. This information is not intended to replace advice given to you by your health care provider. Make sure you discuss any questions you have with your health care provider. Conscious Sedation, Adult, Care After Refer to this sheet in the next few weeks. These instructions provide you with information on caring for yourself after your procedure. Your health care provider [place]clanahan also give you more specific instructions. Your treatment has been planned according to current medical practices, but problems sometimes occur. Call your health care provider if you have any problems or questions after your procedure. WHAT TO EXPECT AFTER THE PROCEDURE  After your procedure:  You Dorgan feel sleepy, clumsy, and have poor balance for several hours.  Vomiting Behrens occur if you eat too soon after the procedure. HOME CARE INSTRUCTIONS  Do not participate in any activities where you could become injured for at least 24 hours. Do not:  Drive.  Swim.  Ride a bicycle.  Operate heavy machinery.  Cook.  Use power tools.  Climb ladders.  Work from a high place.  Do not make important decisions or sign legal documents until you are improved.  If you vomit, drink water, juice, or soup when you can drink without vomiting. Make sure you have little or no nausea before eating solid foods.  Only take over-the-counter or prescription medicines for pain, discomfort, or fever as directed by your health care  provider.  Make sure you and your family fully understand everything about the medicines given to you, including what side effects [place] occur.  You should not drink alcohol, take sleeping pills, or take medicines that cause drowsiness for at least 24 hours.  If you smoke, do not smoke without supervision.  If you are feeling better, you Weesner resume normal activities 24 hours after you were sedated.  Keep all appointments with your health care provider. SEEK MEDICAL CARE IF:  Your skin is pale or bluish in color.  You continue to feel nauseous or vomit.  Your pain is getting worse and is not helped by medicine.  You have bleeding or swelling.  You are still sleepy or feeling clumsy after 24 hours. SEEK IMMEDIATE MEDICAL CARE IF:  You develop a rash.  You have difficulty breathing.  You develop any type of allergic problem.  You have a fever. MAKE SURE YOU:  Understand these instructions.  Will watch your condition.  Will get help right away if you are not doing well or get worse. Document Released: 02/08/2013 Document Reviewed: 02/08/2013 [facility] Patient Information 2015 [place], [place]. This information is not intended to replace advice given to you by your health care provider. Make sure you discuss any questions you have with your health care provider.

## 2014-07-19 NOTE — H&P (Signed)
Chief Complaint: "Back pain."  Referring Physician(s): Dewey,Elizabeth R  History of Present Illness: [name] is a 81 y.o. female with history of multiple falls she states her last fall was in November 2015 and has been c/o worsening lower back pain since then. She rates her pain 5/10 lying flat and 10/10 with movement. She takes (2) Tylenol every 4 hours without much change in pain and is unable to tolerate pain medications secondary to nausea. She does c/o questionable loss of bowel and bladder function, however states this is not all the time and has been intermittent for years. She also states that sometimes she is just unable to make it to the restroom in time, but does have sensation and awareness of need to urinate/deficate. She does have history of a brain tumor that she has previously had surgery for and states her urine incontinence occurred even then. She denies any chest pain, shortness of breath or palpitations. She denies any active signs of bleeding or excessive bruising, she did bite her tongue in her sleep last night and the left side of her tongue is swollen. She denies any recent fever or chills. The patient denies any history of sleep apnea or chronic oxygen use. She has previously tolerated sedation without complications.    Past Medical History  Diagnosis Date  . Seasonal allergies   . HTN (hypertension)   . Osteoarthritis   . Palpitations   . Anemia   . History of colonic polyps   . GERD (gastroesophageal reflux disease)   . Hyperlipidemia   . Chronic facial pain   . Vitamin B12 deficiency   . Dyslipidemia   . Chronic foot pain   . Chronic cough   . Obesity   . Anxiety   . Complication of anesthesia     Sore jaw; could not chew or move mouth  . Diabetes mellitus     type 2 niddm x 20 years  . Metatarsal bone fracture 2014  . Hammer toe     bilateral  . Hx of radiation therapy 03/07/13- 03/29/13    right breast 4250 cGy 17 sessions  . Ejection fraction    . Skin cancer   . Melanoma   . 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 Colonoscopy    . Polypectomy      small adenomatous  . Abdominal hysterectomy    . Cholecystectomy    . Hernia repair    . Knee arthroscopy Bilateral   . Ulnar tunnel release    . Eye surgery    . Cataract extraction w/ intraocular lens implant Right   . Bunionectomy    . Breast lumpectomy with needle localization Right 01/17/2013    Procedure: BREAST LUMPECTOMY WITH NEEDLE LOCALIZATION;  Surgeon: Edward Jolly, MD;  Location: [facility];  Service: General;  Laterality: Right;  . Breast biopsy Right 01/24/2013    Procedure: RE-EXCICION OF BREAST CANCER, ANTERIOR MARGINS;  Surgeon: Edward Jolly, MD;  Location: [facility];  Service: General;  Laterality: Right;  . Craniotomy Right 10/18/2013    Procedure: CRANIOTOMY TUMOR EXCISION;  Surgeon: Floyce Stakes, MD;  Location: [place] [facility];  Service: Neurosurgery;  Laterality: Right;    Allergies: Bystolic; Cholestyramine; Hydrazine yellow; Morphine; Niacin; Niaspan; Norvasc; OptivarPrescott Gum; Tegretol; Telmisartan; Cefdinir; Clonidine; Clonidine hydrochloride; Codeine; Ezetimibe; Naproxen; Ziac; Elavil; Keppra; Lyrica; Ace inhibitors; Actonel; Amlodipine besylate;  Aspirin; Atacand; Bextra; Bisoprolol-hydrochlorothiazide; Candesartan cilexetil; Cefadroxil; Celecoxib; Hydrocodone; Hydrocodone-acetaminophen; Iodinated diagnostic agents; Meloxicam; Methylprednisolone sodium succinate; Nabumetone; Penicillins; Pseudoephedrine-guaifenesin; Risedronate sodium; Rofecoxib; Ru-tuss; Sulfonamide derivatives; [place]; Telithromycin; Terfenadine; Trandolapril-verapamil hcl er; and Valium  Medications: Prior to Admission medications   Medication Sig Start Date End Date Taking? Authorizing Provider  furosemide (LASIX) 20 MG tablet Take 1 tablet (20 mg total) by mouth daily. Do not take until I call  you about labs. Patient taking differently: Take 20 mg by mouth daily as needed for fluid or edema. Do not take until I call you about labs. 12/27/13  Yes Marin Olp, MD  lidocaine (LIDODERM) 5 % Place 1 patch onto the skin daily. Remove & Discard patch within 12 hours or as directed by MD Patient taking differently: Place 1 patch onto the skin daily as needed (for pain). Remove & Discard patch within 12 hours or as directed by MD 11/17/13  Yes Ricard Dillon, MD  metFORMIN (GLUCOPHAGE-XR) 500 MG 24 hr tablet 4 tabs daily Patient taking differently: Take 500 mg by mouth 4 (four) times daily -  with meals and at bedtime.  04/09/14  Yes Renato Shin, MD  acetaminophen (TYLENOL) 500 MG tablet Take 1,000 mg by mouth every 6 (six) hours as needed for pain.    Historical Provider, MD  ALPRAZolam Duanne Moron) 0.25 MG tablet Take 0.25 mg by mouth at bedtime.  02/13/11   Ricard Dillon, MD  calcium carbonate (OS-CAL) 600 MG TABS Take 600 mg by mouth daily.     Historical Provider, MD  cholecalciferol (VITAMIN D) 1000 UNITS tablet Take 1,000 Units by mouth daily.     Historical Provider, MD  colestipol (COLESTID) 1 G tablet Take 2 g by mouth 2 (two) times daily.    Historical Provider, MD  ferrous sulfate 325 (65 FE) MG tablet Take 325 mg by mouth daily with breakfast.     Historical Provider, MD  fexofenadine (ALLEGRA) 60 MG tablet Take 60 mg by mouth daily.     Historical Provider, MD  fish oil-omega-3 fatty acids 1000 MG capsule Take 1 g by mouth daily.     Historical Provider, MD  nateglinide (STARLIX) 120 MG tablet Take 1 tablet (120 mg total) by mouth 3 (three) times daily with meals. 07/17/14   Renato Shin, MD  nitrofurantoin, macrocrystal-monohydrate, (MACROBID) 100 MG capsule  06/05/14   Historical Provider, MD  ranitidine (ZANTAC) 150 MG capsule Take 150 mg by mouth daily as needed for heartburn.     Historical Provider, MD  Preston Fleeting 2-180 MG per tablet TAKE 1 TABLET DAILY 02/06/14   Carlena Bjornstad, MD       Family History  Problem Relation Age of Onset  . Colon cancer    . Heart disease Mother   . Diabetes Father   . Pancreatic cancer Father   . Bone cancer Sister   . Prostate cancer Brother   . Colon cancer Brother   . Rectal cancer Sister   . Thyroid disease Sister     benign goiter resected    History   Social History  . Marital Status: Widowed    Spouse Name: N/A  . Number of Children: 0  . Years of Education: N/A   Occupational History  . retired    Social History Main Topics  . Smoking status: Never Smoker   . Smokeless tobacco: Not on file  . Alcohol Use: No  . Drug Use: No  . Sexual Activity: Yes  Comment: menarche age 78, hysterectomy age 6, HRT x 2-3 mos, G1- miscarriage   Other Topics Concern  . None   Social History Narrative   Widowed, lives alone.  Ambulates independently.    Review of Systems: A 12 point ROS discussed and pertinent positives are indicated in the HPI above.  All other systems are negative.  Review of Systems  Vital Signs: BP 132/97 mmHg  Pulse 89  Temp(Src) 98.7 F (37.1 C) (Oral)  Resp 16  SpO2 97%  Physical Exam  Constitutional: She is oriented to person, place, and time. No distress.  HENT:  Head: Normocephalic.  Left side of tongue swollen, non-obstructing the airway  Neck: No tracheal deviation present.  Cardiovascular: Normal rate and regular rhythm.  Exam reveals no gallop and no friction rub.   No murmur heard. Pulmonary/Chest: Effort normal and breath sounds normal. No respiratory distress. She has no wheezes. She has no rales.  Abdominal: Soft. Bowel sounds are normal. She exhibits no distension. There is no tenderness.  Musculoskeletal:  TTP T12 region and lower cervical region  Neurological: She is alert and oriented to person, place, and time.  Skin: She is not diaphoretic.    Mallampati Score:  MD Evaluation Airway: WNL Heart: WNL Abdomen: WNL Chest/ Lungs: WNL ASA  Classification:  3 Mallampati/Airway Score: Two  Imaging: Mr Thoracic Spine Wo Contrast  07/09/2014   CLINICAL DATA:  Back pain.  History of breast cancer.  EXAM: MRI THORACIC SPINE WITHOUT CONTRAST  TECHNIQUE: Multiplanar, multisequence MR imaging of the thoracic spine was performed. No intravenous contrast was administered.  COMPARISON:  None.  FINDINGS: Severe compression fracture T12. Mild amount of central edema. The majority of the vertebral body has fatty signal and this appears to be a chronic fracture. Retropulsion of bone into the canal causing mild spinal stenosis. This appears to be a benign fracture. Mild chronic compression fracture T4.  No acute fracture.  No evidence of metastatic disease.  Spinal cord signal is normal.  No cord compression or cord lesion.  Hemangioma at T6 vertebral body on the left at T10 vertebral body on the right.  Mild thoracic disc degeneration without spinal stenosis.  T2-3: Small central disc protrusion  T3-4: Small central disc protrusion  T4-5: Small central disc protrusion  T4-5: Small central disc protrusion  T9-10: Small central disc protrusion  10 x 15 mm bilateral thoracic meningoceles at T9-10.  Small left  lateral thoracic meningocele at T11-12  IMPRESSION: Chronic compression fracture T12. This is a severe compression fracture with retropulsion of bone into the canal causing mild spinal stenosis.  Mild chronic fracture of T4  No acute fracture or metastatic disease.   Electronically Signed   By: Franchot Gallo M.D.   On: 07/09/2014 14:17    Labs:  CBC:  Recent Labs  10/19/13 0500 11/17/13 2010 12/01/13 1659 12/27/13 1356 07/19/14 0830  WBC 15.5*  --  9.1 9.1 8.4  HGB 9.1* 10.2* 12.2 11.7* 11.4*  HCT 27.2* 30.0* 36.5 35.7* 34.7*  PLT 218  --  359 349.0 258    COAGS:  Recent Labs  10/12/13 0449 07/19/14 0830  INR 1.08 1.00  APTT 25  --     BMP:  Recent Labs  10/12/13 0449 10/13/13 0632 10/14/13 1340  10/19/13 0500 11/17/13 2010 12/01/13 1659  12/27/13 1356  NA 131* 135* 132*  < > 130* 129* 134* 132*  K 4.2 4.4 4.2  < > 4.5 4.1 4.4 4.5  CL 95*  100 93*  --  95* 95* 100 100  CO2 21 22 22   --  19  --  22 23  GLUCOSE 289* 304* 222*  --  362* 194* 92 130*  BUN 19 19 19   --  19 6 12 11   CALCIUM 8.4 8.2* 8.7  --  7.5*  --  9.0 9.2  CREATININE 0.78 0.68 0.70  --  0.66 0.60 0.78 0.7  GFRNONAA 76* 80* 79*  --  81*  --   --   --   GFRAA 88* >90 >90  --  >90  --   --   --   < > = values in this interval not displayed.  LIVER FUNCTION TESTS:  Recent Labs  08/06/13 1846 10/10/13 0250 10/14/13 1340 12/27/13 1356  BILITOT 0.3 0.5 0.3 0.6  AST 16 11 17 20   ALT 12 10 16 14   ALKPHOS 57 72 56 47  PROT 6.6 6.6 5.4* 6.0  ALBUMIN 3.7 3.5 2.7* 3.5   Assessment and Plan: History of multiple falls, last fall November/2015 Uncontrolled lower back pain, MRI done 07/09/14 revealed T12 compression fracture, images reviewed with Dr. Vernard Gambles who feels T12 is new since November and on MRI has some edema with no evidence of cord compression, amendable to VP/KP Patient is unable to tolerate pain medication The patient has been NPO, no blood thinners taken, labs and vitals have been reviewed. Risks and Benefits discussed with the patient including, but not limited to education regarding the natural healing process of compression fractures without intervention, bleeding, infection, cement migration which [place] cause spinal cord damage, paralysis, pulmonary embolism or even death. All of the patient's questions were answered, patient is agreeable to proceed. Consent signed and in chart. History of breast cancer History of brain tumor s/p resection 10/2013   Thank you for this interesting consult.  I greatly enjoyed meeting [name] and look forward to participating in their care.  SignedHedy Jacob 07/19/2014, 9:06 AM   I spent a total of 20 Minutes in face to face in clinical consultation, greater than 50% of which was counseling/coordinating  care for uncontrolled back pain and T12 compression fracture.

## 2014-07-20 ENCOUNTER — Emergency Department (HOSPITAL_COMMUNITY)
Admission: EM | Admit: 2014-07-20 | Discharge: 2014-07-20 | Disposition: A | Discharge status: Home / Self Care | Payer: Medicare Other | Attending: Emergency Medicine | Admitting: Emergency Medicine

## 2014-07-20 ENCOUNTER — Telehealth: Payer: Self-pay | Admitting: Endocrinology

## 2014-07-20 ENCOUNTER — Ambulatory Visit: Payer: Medicare Other | Admitting: Cardiology

## 2014-07-20 ENCOUNTER — Encounter (HOSPITAL_COMMUNITY): Payer: Self-pay | Admitting: Emergency Medicine

## 2014-07-20 DIAGNOSIS — M1990 Unspecified osteoarthritis, unspecified site: Secondary | ICD-10-CM | POA: Diagnosis not present

## 2014-07-20 DIAGNOSIS — E785 Hyperlipidemia, unspecified: Secondary | ICD-10-CM | POA: Diagnosis not present

## 2014-07-20 DIAGNOSIS — S01512A Laceration without foreign body of oral cavity, initial encounter: Secondary | ICD-10-CM | POA: Diagnosis not present

## 2014-07-20 DIAGNOSIS — K219 Gastro-esophageal reflux disease without esophagitis: Secondary | ICD-10-CM | POA: Insufficient documentation

## 2014-07-20 DIAGNOSIS — G8929 Other chronic pain: Secondary | ICD-10-CM | POA: Insufficient documentation

## 2014-07-20 DIAGNOSIS — Z8781 Personal history of (healed) traumatic fracture: Secondary | ICD-10-CM | POA: Insufficient documentation

## 2014-07-20 DIAGNOSIS — F419 Anxiety disorder, unspecified: Secondary | ICD-10-CM | POA: Diagnosis not present

## 2014-07-20 DIAGNOSIS — Z79899 Other long term (current) drug therapy: Secondary | ICD-10-CM | POA: Diagnosis not present

## 2014-07-20 DIAGNOSIS — Z880 Allergy status to penicillin: Secondary | ICD-10-CM | POA: Insufficient documentation

## 2014-07-20 DIAGNOSIS — E669 Obesity, unspecified: Secondary | ICD-10-CM | POA: Insufficient documentation

## 2014-07-20 DIAGNOSIS — Y9289 Other specified places as the place of occurrence of the external cause: Secondary | ICD-10-CM | POA: Diagnosis not present

## 2014-07-20 DIAGNOSIS — Y9389 Activity, other specified: Secondary | ICD-10-CM | POA: Insufficient documentation

## 2014-07-20 DIAGNOSIS — Z853 Personal history of malignant neoplasm of breast: Secondary | ICD-10-CM | POA: Insufficient documentation

## 2014-07-20 DIAGNOSIS — Y998 Other external cause status: Secondary | ICD-10-CM | POA: Diagnosis not present

## 2014-07-20 DIAGNOSIS — I10 Essential (primary) hypertension: Secondary | ICD-10-CM | POA: Diagnosis not present

## 2014-07-20 DIAGNOSIS — E119 Type 2 diabetes mellitus without complications: Secondary | ICD-10-CM | POA: Diagnosis not present

## 2014-07-20 DIAGNOSIS — Z8719 Personal history of other diseases of the digestive system: Secondary | ICD-10-CM | POA: Insufficient documentation

## 2014-07-20 DIAGNOSIS — X58XXXA Exposure to other specified factors, initial encounter: Secondary | ICD-10-CM | POA: Diagnosis not present

## 2014-07-20 MED ORDER — CLINDAMYCIN HCL 300 MG PO CAPS
300.0000 mg | ORAL_CAPSULE | Freq: Once | ORAL | Status: AC
  Administered 2014-07-20: 300 mg via ORAL
  Filled 2014-07-20: qty 1

## 2014-07-20 MED ORDER — CLINDAMYCIN HCL 300 MG PO CAPS: 300.0000 mg | ORAL_CAPSULE | Freq: Three times a day (TID) | ORAL | Status: DC

## 2014-07-20 NOTE — Telephone Encounter (Signed)
Called and spoke with pt about lab results and pt is aware. See result note.

## 2014-07-20 NOTE — ED Provider Notes (Signed)
81 year old female presents after having a tongue laceration from approximately 48 hours ago. States that the tongue feels uncomfortable, has some difficulty speaking because of her tongue swelling, the bleeding has stopped, on exam she has a linear laceration running longitudinally down the tongue, no foreign bodies in this location, there is a slight amount of fibrinous exudate building up in the [place] caused by the laceration. This is large, 44 hours old, not amenable to repair the emergency department. We'll start clindamycin and referred to dentistry or ENT. Patient was informed of the plan and is in agreement.  Medical screening examination/treatment/procedure(s) were conducted as a shared visit with non-physician practitioner(s) and myself.  I personally evaluated the patient during the encounter.  Clinical Impression:   Final diagnoses:  Tongue laceration, initial encounter         Noemi Chapel, MD 07/22/14 [phone]

## 2014-07-20 NOTE — ED Notes (Signed)
Pt reports 2 night ago she woke up with blood all over her. Pt reports it looked like a murder scene. Pt states she realized her tongue was but and now is concerned because she feels like it is infected. Pt tongue is split from with large flap protruding. Pt is alert and oriented.

## 2014-07-20 NOTE — Telephone Encounter (Signed)
Pt returning call for lab results  

## 2014-07-20 NOTE — Discharge Instructions (Signed)
Mouth Laceration A mouth laceration is a cut inside the mouth. TREATMENT  Because of all the bacteria in the mouth, lacerations are usually not stitched (sutured) unless the wound is gaping open. Sometimes, a couple sutures Laham be placed just to hold the edges of the wound together and to speed healing. Over the next 1 to 2 days, you will see that the wound edges appear gray in color. The edges Echevarria appear ragged and slightly spread apart. Because of all the normal bacteria in the mouth, these wounds are contaminated, but this is not an infection that needs antibiotics. Most wounds heal with no problems despite their appearance. HOME CARE INSTRUCTIONS   Rinse your mouth with a warm, saltwater wash 4 to 6 times per day, or as your caregiver instructs.  Continue oral hygiene and gentle tooth brushing as normal, if possible.  Do not eat or drink hot food or beverages while your mouth is still numb.  Eat a bland diet to avoid irritation from acidic foods.  Only take over-the-counter or prescription medicines for pain, discomfort, or fever as directed by your caregiver.  Follow up with your caregiver as instructed. You Dapolito need to see your caregiver for a wound check in 48 to 72 hours to make sure your wound is healing.  If your laceration was sutured, do not play with the sutures or knots with your tongue. If you do this, they will gradually loosen and Yett become untied. You Adel need a tetanus shot if:  You cannot remember when you had your last tetanus shot.  You have never had a tetanus shot. If you get a tetanus shot, your arm Lerew swell, get red, and feel warm to the touch. This is common and not a problem. If you need a tetanus shot and you choose not to have one, there is a rare chance of getting tetanus. Sickness from tetanus can be serious. SEEK MEDICAL CARE IF:   You develop swelling or increasing pain in the wound or in other parts of your face.  You have a fever.  You develop  swollen, tender glands in the throat.  You notice the wound edges do not stay together after your sutures have been removed.  You see pus coming from the wound. Some drainage in the mouth is normal. MAKE SURE YOU:   Understand these instructions.  Will watch your condition.  Will get help right away if you are not doing well or get worse. Document Released: 04/20/2005 Document Revised: 07/13/2011 Document Reviewed: 10/23/2010 [facility] Patient Information 2015 [place], [place]. This information is not intended to replace advice given to you by your health care provider. Make sure you discuss any questions you have with your health care provider.

## 2014-07-20 NOTE — ED Provider Notes (Signed)
CSN: [id number]     Arrival date & time 07/20/14  1958 History  This chart was scribed for Charlann Lange, PA-C, working with Noemi Chapel, MD by Starleen Arms, ED Scribe. This patient was seen in room [place] and the patient's care was started at 10:31 PM.   Chief Complaint  Patient presents with  . Mouth Injury   The history is provided by the patient. No language interpreter was used.   HPI Comments: [name] is a 81 y.o. female who presents to the Emergency Department complaining of a mouth injury onset 2 nights ago.  Patient reports she awoke in the night and discovered that there was a large laceration on her tongue.  She reports the pain associated with the injury has been worsening.  Patient reports she has not been seen for this complaint before.  Patient reports she had a had a kyphoplasty yesterday.    Past Medical History  Diagnosis Date  . Seasonal allergies   . HTN (hypertension)   . Osteoarthritis   . Palpitations   . Anemia   . History of colonic polyps   . GERD (gastroesophageal reflux disease)   . Hyperlipidemia   . Chronic facial pain   . Vitamin B12 deficiency   . Dyslipidemia   . Chronic foot pain   . Chronic cough   . Obesity   . Anxiety   . Complication of anesthesia     Sore jaw; could not chew or move mouth  . Diabetes mellitus     type 2 niddm x 20 years  . Metatarsal bone fracture 2014  . Hammer toe     bilateral  . Hx of radiation therapy 03/07/13- 03/29/13    right breast 4250 cGy 17 sessions  . Ejection fraction   . Skin cancer   . Melanoma   . 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 Colonoscopy    . Polypectomy      small adenomatous  . Abdominal hysterectomy    . Cholecystectomy    . Hernia repair    . Knee arthroscopy Bilateral   . Ulnar tunnel release    . Eye surgery    . Cataract extraction w/ intraocular lens implant Right   . Bunionectomy    .  Breast lumpectomy with needle localization Right 01/17/2013    Procedure: BREAST LUMPECTOMY WITH NEEDLE LOCALIZATION;  Surgeon: Edward Jolly, MD;  Location: [facility];  Service: General;  Laterality: Right;  . Breast biopsy Right 01/24/2013    Procedure: RE-EXCICION OF BREAST CANCER, ANTERIOR MARGINS;  Surgeon: Edward Jolly, MD;  Location: [facility];  Service: General;  Laterality: Right;  . Craniotomy Right 10/18/2013    Procedure: CRANIOTOMY TUMOR EXCISION;  Surgeon: Floyce Stakes, MD;  Location: [place] [facility];  Service: Neurosurgery;  Laterality: Right;   Family History  Problem Relation Age of Onset  . Colon cancer    . Heart disease Mother   . Diabetes Father   . Pancreatic cancer Father   . Bone cancer Sister   . Prostate cancer Brother   . Colon cancer Brother   . Rectal cancer Sister   . Thyroid disease Sister     benign goiter resected   History  Substance Use Topics  . Smoking status: Never Smoker   . Smokeless tobacco: Not on file  . Alcohol Use: No   OB  History    No data available     Review of Systems  HENT: Positive for dental problem.       Allergies  Bystolic; Cholestyramine; Hydrazine yellow; Morphine; Niacin; Niaspan; Norvasc; OptivarPrescott Gum; Tegretol; Telmisartan; Cefdinir; Clonidine; Clonidine hydrochloride; Codeine; Ezetimibe; Naproxen; Ziac; Elavil; Keppra; Lyrica; Ace inhibitors; Actonel; Amlodipine besylate; Aspirin; Atacand; Bextra; Bisoprolol-hydrochlorothiazide; Candesartan cilexetil; Cefadroxil; Celecoxib; Hydrocodone; Hydrocodone-acetaminophen; Iodinated diagnostic agents; Meloxicam; Methylprednisolone sodium succinate; Nabumetone; Penicillins; Pseudoephedrine-guaifenesin; Risedronate sodium; Rofecoxib; Ru-tuss; Sulfonamide derivatives; [place]; Telithromycin; Terfenadine; Trandolapril-verapamil hcl er; and Valium  Home Medications   Prior to Admission medications   Medication Sig Start Date End Date Taking? Authorizing Provider   acetaminophen (TYLENOL) 500 MG tablet Take 1,000 mg by mouth every 6 (six) hours as needed for pain.    Historical Provider, MD  ALPRAZolam Duanne Moron) 0.25 MG tablet Take 0.25 mg by mouth at bedtime.  02/13/11   Ricard Dillon, MD  calcium carbonate (OS-CAL) 600 MG TABS Take 600 mg by mouth daily.     Historical Provider, MD  colestipol (COLESTID) 1 G tablet Take 2 g by mouth 2 (two) times daily.    Historical Provider, MD  cyanocobalamin (,VITAMIN B-12,) 1000 MCG/ML injection Inject 1,000 mcg into the muscle every 21 ( twenty-one) days.    Historical Provider, MD  ferrous sulfate 325 (65 FE) MG tablet Take 325 mg by mouth daily with breakfast.     Historical Provider, MD  fexofenadine (ALLEGRA) 60 MG tablet Take 60 mg by mouth every other day.     Historical Provider, MD  fish oil-omega-3 fatty acids 1000 MG capsule Take 1 g by mouth daily.     Historical Provider, MD  furosemide (LASIX) 20 MG tablet Take 1 tablet (20 mg total) by mouth daily. Do not take until I call you about labs. Patient taking differently: Take 20 mg by mouth daily as needed for fluid or edema. Do not take until I call you about labs. 12/27/13   Marin Olp, MD  lidocaine (LIDODERM) 5 % Place 1 patch onto the skin daily. Remove & Discard patch within 12 hours or as directed by MD Patient taking differently: Place 1 patch onto the skin daily as needed (for pain). Remove & Discard patch within 12 hours or as directed by MD 11/17/13   Ricard Dillon, MD  metFORMIN (GLUCOPHAGE-XR) 500 MG 24 hr tablet 4 tabs daily Patient taking differently: Take 500 mg by mouth 4 (four) times daily -  with meals and at bedtime.  04/09/14   Renato Shin, MD  nateglinide (STARLIX) 120 MG tablet Take 1 tablet (120 mg total) by mouth 3 (three) times daily with meals. 07/17/14   Renato Shin, MD  nitrofurantoin, macrocrystal-monohydrate, (MACROBID) 100 MG capsule Take 100 mg by mouth daily.  06/05/14   Historical Provider, MD  Polyethyl Glycol-Propyl  Glycol 0.4-0.3 % SOLN Place 2 drops into both eyes 2 (two) times daily as needed (for pain and dry eyes).    Historical Provider, MD  ranitidine (ZANTAC) 150 MG capsule Take 150 mg by mouth daily as needed for heartburn.     Historical Provider, MD  TARKA 2-180 MG per tablet TAKE 1 TABLET DAILY 02/06/14   Carlena Bjornstad, MD   BP 170/70 mmHg  Pulse 72  Temp(Src) 98.6 F (37 C) (Oral)  Resp 15  SpO2 100% Physical Exam  Constitutional: She is oriented to person, place, and time. She appears well-developed and well-nourished. No distress.  HENT:  Head: Normocephalic and atraumatic.  Large linear laceration to left tongue, not through and through. No bleeding.   Eyes: Conjunctivae and EOM are normal.  Neck: Neck supple. No tracheal deviation present.  Cardiovascular: Normal rate.   Pulmonary/Chest: Effort normal. No respiratory distress.  Musculoskeletal: Normal range of motion.  Neurological: She is alert and oriented to person, place, and time.  Skin: Skin is warm and dry.  Psychiatric: She has a normal mood and affect. Her behavior is normal.  Nursing note and vitals reviewed.   ED Course  Procedures (including critical care time)  DIAGNOSTIC STUDIES: Oxygen Saturation is 100% on RA, normal by my interpretation.    COORDINATION OF CARE:    Labs Review Labs Reviewed - No data to display   EKG Interpretation None      MDM   Final diagnoses:  None    1. Tongue laceration  No repair required or recommended today. Will provide abx to prevent infection. Will refer to ENT for further management. Dr. Sabra Heck has seen and evaluated the patient.   I personally performed the services described in this documentation, which was scribed in my presence. The recorded information has been reviewed and is accurate.      Charlann Lange, PA-C 07/20/14 2251  Noemi Chapel, MD 07/22/14 [place], MD 07/22/14 [phone]

## 2014-07-20 NOTE — Telephone Encounter (Signed)
Patient would like to obtain her lab results   Please advise     Thank you

## 2014-07-20 NOTE — Telephone Encounter (Signed)
Attempted to call pt; no answer.  Will attempt to call again at a later time.  See result note.

## 2014-07-24 DIAGNOSIS — S01512A Laceration without foreign body of oral cavity, initial encounter: Secondary | ICD-10-CM | POA: Diagnosis not present

## 2014-07-24 DIAGNOSIS — H6123 Impacted cerumen, bilateral: Secondary | ICD-10-CM | POA: Diagnosis not present

## 2014-08-06 ENCOUNTER — Other Ambulatory Visit: Payer: Self-pay | Admitting: General Surgery

## 2014-08-06 DIAGNOSIS — R921 Mammographic calcification found on diagnostic imaging of breast: Secondary | ICD-10-CM

## 2014-08-07 DIAGNOSIS — S01512D Laceration without foreign body of oral cavity, subsequent encounter: Secondary | ICD-10-CM | POA: Diagnosis not present

## 2014-08-08 DIAGNOSIS — N390 Urinary tract infection, site not specified: Secondary | ICD-10-CM | POA: Diagnosis not present

## 2014-08-08 DIAGNOSIS — E119 Type 2 diabetes mellitus without complications: Secondary | ICD-10-CM | POA: Diagnosis not present

## 2014-08-08 DIAGNOSIS — I10 Essential (primary) hypertension: Secondary | ICD-10-CM | POA: Diagnosis not present

## 2014-08-13 ENCOUNTER — Ambulatory Visit (INDEPENDENT_AMBULATORY_CARE_PROVIDER_SITE_OTHER): Payer: Medicare Other | Admitting: Podiatry

## 2014-08-13 ENCOUNTER — Encounter: Payer: Self-pay | Admitting: Podiatry

## 2014-08-13 VITALS — BP 136/70 | HR 66 | Temp 96.6°F | Resp 12

## 2014-08-13 DIAGNOSIS — G629 Polyneuropathy, unspecified: Secondary | ICD-10-CM

## 2014-08-13 DIAGNOSIS — L84 Corns and callosities: Secondary | ICD-10-CM | POA: Diagnosis not present

## 2014-08-13 DIAGNOSIS — E1142 Type 2 diabetes mellitus with diabetic polyneuropathy: Secondary | ICD-10-CM

## 2014-08-13 NOTE — Patient Instructions (Signed)
Diabetes and Foot Care Diabetes Senn cause you to have problems because of poor blood supply (circulation) to your feet and legs. This Melander cause the skin on your feet to become thinner, break easier, and heal more slowly. Your skin Quezada become dry, and the skin Stansbery peel and crack. You Matar also have nerve damage in your legs and feet causing decreased feeling in them. You Teutsch not notice minor injuries to your feet that could lead to infections or more serious problems. Taking care of your feet is one of the most important things you can do for yourself.  HOME CARE INSTRUCTIONS  Wear shoes at all times, even in the house. Do not go barefoot. Bare feet are easily injured.  Check your feet daily for blisters, cuts, and redness. If you cannot see the bottom of your feet, use a mirror or ask someone for help.  Wash your feet with warm water (do not use hot water) and mild soap. Then pat your feet and the areas between your toes until they are completely dry. Do not soak your feet as this can dry your skin.  Apply a moisturizing lotion or petroleum jelly (that does not contain alcohol and is unscented) to the skin on your feet and to dry, brittle toenails. Do not apply lotion between your toes.  Trim your toenails straight across. Do not dig under them or around the cuticle. File the edges of your nails with an emery board or nail file.  Do not cut corns or calluses or try to remove them with medicine.  Wear clean socks or stockings every day. Make sure they are not too tight. Do not wear knee-high stockings since they Colglazier decrease blood flow to your legs.  Wear shoes that fit properly and have enough cushioning. To break in new shoes, wear them for just a few hours a day. This prevents you from injuring your feet. Always look in your shoes before you put them on to be sure there are no objects inside.  Do not cross your legs. This Thien decrease the blood flow to your feet.  If you find a minor scrape,  cut, or break in the skin on your feet, keep it and the skin around it clean and dry. These areas Korte be cleansed with mild soap and water. Do not cleanse the area with peroxide, alcohol, or iodine.  When you remove an adhesive bandage, be sure not to damage the skin around it.  If you have a wound, look at it several times a day to make sure it is healing.  Do not use heating pads or hot water bottles. They Kregel burn your skin. If you have lost feeling in your feet or legs, you Mckeag not know it is happening until it is too late.  Make sure your health care provider performs a complete foot exam at least annually or more often if you have foot problems. Report any cuts, sores, or bruises to your health care provider immediately. SEEK MEDICAL CARE IF:   You have an injury that is not healing.  You have cuts or breaks in the skin.  You have an ingrown nail.  You notice redness on your legs or feet.  You feel burning or tingling in your legs or feet.  You have pain or cramps in your legs and feet.  Your legs or feet are numb.  Your feet always feel cold. SEEK IMMEDIATE MEDICAL CARE IF:   There is increasing redness,   swelling, or pain in or around a wound.  There is a red line that goes up your leg.  Pus is coming from a wound.  You develop a fever or as directed by your health care provider.  You notice a bad smell coming from an ulcer or wound. Document Released: 04/17/2000 Document Revised: 12/21/2012 Document Reviewed: 09/27/2012 ExitCare Patient Information 2015 [employer]. This information is not intended to replace advice given to you by your health care provider. Make sure you discuss any questions you have with your health care provider.  

## 2014-08-14 NOTE — Progress Notes (Signed)
Patient ID: [name], female   DOB: [birth year], 81 y.o.   MRN: [id number]  Subjective: This patient presents for ongoing care for pre-ulcerative plantar callus sub-left first MPJ under our care since 02/19/2014  Objective: Bleeding plantar callus sub-left first MPJ remains closed after debridement there is no surrounding erythema, edema, warmth or drainage  Assessment: History of diabetic peripheral neuropathy Pre-ulcerative plantar keratoses sub-left first MPJ  Plan: Debrided plantar keratoses 1 Maintain existing diabetic shoes with insoles and dancers pad attached to insoles on left  Reappoint 4 weeks

## 2014-08-15 ENCOUNTER — Other Ambulatory Visit: Payer: Self-pay | Admitting: *Deleted

## 2014-08-15 ENCOUNTER — Ambulatory Visit (INDEPENDENT_AMBULATORY_CARE_PROVIDER_SITE_OTHER): Payer: Medicare Other | Admitting: Diagnostic Neuroimaging

## 2014-08-15 ENCOUNTER — Encounter: Payer: Self-pay | Admitting: Diagnostic Neuroimaging

## 2014-08-15 VITALS — BP 144/82 | HR 80 | Ht 67.0 in | Wt 133.4 lb

## 2014-08-15 DIAGNOSIS — R569 Unspecified convulsions: Secondary | ICD-10-CM

## 2014-08-15 DIAGNOSIS — D329 Benign neoplasm of meninges, unspecified: Secondary | ICD-10-CM

## 2014-08-15 MED ORDER — LACOSAMIDE 100 MG PO TABS: 100.0000 mg | ORAL_TABLET | Freq: Two times a day (BID) | ORAL | Status: DC

## 2014-08-15 MED ORDER — LACOSAMIDE 50 MG PO TABS: 50.0000 mg | ORAL_TABLET | Freq: Two times a day (BID) | ORAL | Status: DC

## 2014-08-15 NOTE — Patient Instructions (Signed)
Start vimpat 50mg  twice a day. After 1 month increase to 100mg  twice a day.  No driving until seizure free x 6 months.

## 2014-08-15 NOTE — Progress Notes (Signed)
[facility]  PATIENT: [name] DOB: 07-28-1929  REFERRING CLINICIAN: Elie Goody HISTORY FROM: patient and niece REASON FOR VISIT: new consult     HISTORICAL  CHIEF COMPLAINT:  Chief Complaint  Patient presents with  . New Evaluation    r/o seizures     HISTORY OF PRESENT ILLNESS:   81 year old female here for evaluation of tongue laceration and possible seizures. In 2015 patient was having intermittent episodes of falling down. There is question of whether she had unresponsiveness with these events. She was initially evaluated for syncopal workup with heart testing. Ultimately she was admitted to the hospital in June 2015 for confusion, altered mental status, recurrent falls. She's found to have a large right frontal meningioma and right subdural hematoma. Patient was treated with surgical resection, Decadron and antiseizure medication. Neurology consult was obtained who felt patient was also having intermittent complex partial seizures. She was discharged on Keppra 500 mg twice a day. 2 weeks after discharge patient stopped his medication on her own due to side effects of jitteriness, dizziness.  Patient continues to live on her own, continues to have intermittent falls and confusion spells. In 07/18/2014 patient woke up with large amount of blood on her pillow and bed sheets. She had a severe tongue laceration. No muscle aches, incontinence, confusion. The next day patient went to the hospital for kyphoplasty procedure. On 07/20/2014 patient went to the emergency room for evaluation of possible infection of tongue laceration. She followed up with ENT Dr. Constance Holster. Due to history of meningioma and seizures, patient was referred back to me for consultation.  Patient still drives, short distances to church and grocery store. She is also had lower extremity weakness and numbness. She has diabetes.   REVIEW OF SYSTEMS: Full 14 system review of systems performed and notable  only for weight loss swelling in legs feeling cold joint pain allergies anxiety headache confusion memory loss.   ALLERGIES: Allergies  Allergen Reactions  . Bystolic [Nebivolol Hcl] Other (See Comments)    "extreme weakness, heaviness in legs & arms, swelling in legs/arms/face, swollen abdomen, pain in bladder, feet pain, soreness in chest"  . Cholestyramine     "itching rash on stomach, bloated, nausea, vomiting, sleeplessness, extreme pain in arms"  . Hydrazine Yellow [Tartrazine] Other (See Comments)    "does not reduce high blood pressure, pain in arm, high pressure, felt like I was on verge of heart attack, really weak"  . Morphine Other (See Comments)    Feels morbid, weak, still in pain  . Niacin Palpitations    Fast heart beat  . Niaspan [Niacin Er] Palpitations and Other (See Comments)    "fast heart beat, high blood pressure"  . Norvasc [Amlodipine Besylate] Other (See Comments)    "extreme fluid retention/pain)  . Optivar [Azelastine Hcl] Photosensitivity  . Sular [Nisoldipine Er] Other (See Comments)    "severe headaches, swelling eyes, hands, feet, shortness of breath, weak, flushed face, brain boiling, fluid retention, high blood sugar, nervous, heart fast beating"  . Tegretol [Carbamazepine] Other (See Comments)    Blood poisoning   . Telmisartan Other (See Comments)    "headache, difficulty urinating, high blood sugar, fluid retention"  . Cefdinir Swelling    Vaginal irritation, breathing,   . Clonidine Other (See Comments)    Dry mouth, fluid retention  . Clonidine Hydrochloride Other (See Comments)    Dry mouth, fluid retention  . Codeine Nausea And Vomiting  . Ezetimibe Other (See Comments)  Made weak  . Naproxen Other (See Comments)    Shrinks bladder  . Ziac [Bisoprolol-Hydrochlorothiazide] Other (See Comments)    "stopped urination"  . Elavil [Amitriptyline] Other (See Comments)    Gave Pt nightmares  . Keppra [Levetiracetam]     Shaking  . Lyrica  [Pregabalin] Swelling  . Ace Inhibitors Other (See Comments)    unknown  . Actonel [Risedronate Sodium] Other (See Comments)    unknown  . Amlodipine Besylate Other (See Comments)    unknown  . Aspirin Other (See Comments)    unknown  . Atacand [Candesartan] Other (See Comments)    unknown  . Bextra [Valdecoxib] Other (See Comments)    unknown  . Bisoprolol-Hydrochlorothiazide Other (See Comments)    unknown  . Candesartan Cilexetil Other (See Comments)    unknown  . Cefadroxil Other (See Comments)    unknown  . Celecoxib Rash  . Hydrocodone Other (See Comments)    unknown  . Hydrocodone-Acetaminophen Other (See Comments)    unknown  . Iodinated Diagnostic Agents Rash    "All over"  . Meloxicam Other (See Comments)    unknown  . Methylprednisolone Sodium Succinate Other (See Comments)    unknown  . Nabumetone Other (See Comments)    unknown  . Penicillins Other (See Comments)    unknown  . Pseudoephedrine-Guaifenesin Other (See Comments)    unknown  . Risedronate Sodium Other (See Comments)    unknown  . Rofecoxib Other (See Comments)    unknown  . Ru-Tuss [Chlorphen-Pse-Atrop-Hyos-Scop] Other (See Comments)    unknown  . Sulfonamide Derivatives Other (See Comments)    unknown  . Sulphur [Sulfur] Other (See Comments)    unknown  . Telithromycin Other (See Comments)    unknown  . Terfenadine Other (See Comments)    unknown  . Trandolapril-Verapamil Hcl Er Other (See Comments)    Headache, difficulty urinating, high blood sugar, fluid retention  . Valium [Diazepam] Other (See Comments)    Makes her mean and hyper    HOME MEDICATIONS: Outpatient Prescriptions Prior to Visit  Medication Sig Dispense Refill  . acetaminophen (TYLENOL) 500 MG tablet Take 1,000 mg by mouth every 6 (six) hours as needed for pain.    Marland Kitchen ALPRAZolam (XANAX) 0.25 MG tablet Take 0.25 mg by mouth at bedtime.     . colestipol (COLESTID) 1 G tablet Take 2 g by mouth 2 (two) times daily.      . cyanocobalamin (,VITAMIN B-12,) 1000 MCG/ML injection Inject 1,000 mcg into the muscle every 21 ( twenty-one) days.    . fexofenadine (ALLEGRA) 60 MG tablet Take 60 mg by mouth every other day.     . furosemide (LASIX) 20 MG tablet Take 1 tablet (20 mg total) by mouth daily. Do not take until I call you about labs. (Patient taking differently: Take 20 mg by mouth daily as needed for fluid or edema. Do not take until I call you about labs.) 30 tablet 0  . lidocaine (LIDODERM) 5 % Place 1 patch onto the skin daily. Remove & Discard patch within 12 hours or as directed by MD (Patient taking differently: Place 1 patch onto the skin daily as needed (for pain). Remove & Discard patch within 12 hours or as directed by MD) 90 patch 1  . metFORMIN (GLUCOPHAGE-XR) 500 MG 24 hr tablet 4 tabs daily (Patient taking differently: Take 500 mg by mouth 4 (four) times daily -  with meals and at bedtime. ) 360 tablet 3  .  nateglinide (STARLIX) 120 MG tablet Take 1 tablet (120 mg total) by mouth 3 (three) times daily with meals. 270 tablet 3  . ranitidine (ZANTAC) 150 MG capsule Take 150 mg by mouth daily as needed for heartburn.     Marland Kitchen TARKA 2-180 MG per tablet TAKE 1 TABLET DAILY 90 tablet 1  . ferrous sulfate 325 (65 FE) MG tablet Take 325 mg by mouth daily with breakfast.     . calcium carbonate (OS-CAL) 600 MG TABS Take 600 mg by mouth daily.     Vladimir Faster Glycol-Propyl Glycol 0.4-0.3 % SOLN Place 2 drops into both eyes 2 (two) times daily as needed (for pain and dry eyes).    . clindamycin (CLEOCIN) 300 MG capsule Take 1 capsule (300 mg total) by mouth 3 (three) times daily. 30 capsule 0  . fish oil-omega-3 fatty acids 1000 MG capsule Take 1 g by mouth daily.     . nitrofurantoin, macrocrystal-monohydrate, (MACROBID) 100 MG capsule Take 100 mg by mouth daily.   0   Facility-Administered Medications Prior to Visit  Medication Dose Route Frequency Provider Last Rate Last Dose  . cyanocobalamin ((VITAMIN B-12))  injection 1,000 mcg  1,000 mcg Intramuscular Q30 days Ricard Dillon, MD   1,000 mcg at 02/13/11 1660    PAST MEDICAL HISTORY: Past Medical History  Diagnosis Date  . Seasonal allergies   . HTN (hypertension)   . Osteoarthritis   . Palpitations   . Anemia   . History of colonic polyps   . GERD (gastroesophageal reflux disease)   . Hyperlipidemia   . Chronic facial pain   . Vitamin B12 deficiency   . Dyslipidemia   . Chronic foot pain   . Chronic cough   . Obesity   . Anxiety   . Complication of anesthesia     Sore jaw; could not chew or move mouth  . Diabetes mellitus     type 2 niddm x 20 years  . Metatarsal bone fracture 2014  . Hammer toe     bilateral  . Hx of radiation therapy 03/07/13- 03/29/13    right breast 4250 cGy 17 sessions  . Ejection fraction   . Skin cancer   . Melanoma   . Cancer of right breast 12/26/12    right breast 12:00 o'clock, DCIS    PAST SURGICAL HISTORY: Past Surgical History  Procedure Laterality Date  . Colonoscopy    . Polypectomy      small adenomatous  . Abdominal hysterectomy    . Cholecystectomy    . Hernia repair    . Knee arthroscopy Bilateral   . Ulnar tunnel release    . Eye surgery    . Cataract extraction w/ intraocular lens implant Right   . Bunionectomy    . Breast lumpectomy with needle localization Right 01/17/2013    Procedure: BREAST LUMPECTOMY WITH NEEDLE LOCALIZATION;  Surgeon: Edward Jolly, MD;  Location: [facility];  Service: General;  Laterality: Right;  . Breast biopsy Right 01/24/2013    Procedure: RE-EXCICION OF BREAST CANCER, ANTERIOR MARGINS;  Surgeon: Edward Jolly, MD;  Location: [facility];  Service: General;  Laterality: Right;  . Craniotomy Right 10/18/2013    Procedure: CRANIOTOMY TUMOR EXCISION;  Surgeon: Floyce Stakes, MD;  Location: [facility];  Service: Neurosurgery;  Laterality: Right;    FAMILY HISTORY: Family History  Problem Relation Age of Onset  . Colon cancer    . Heart disease  Mother   .  Diabetes Father   . Pancreatic cancer Father   . Bone cancer Sister   . Prostate cancer Brother   . Colon cancer Brother   . Rectal cancer Sister   . Thyroid disease Sister     benign goiter resected    SOCIAL HISTORY:  History   Social History  . Marital Status: Widowed    Spouse Name: N/A  . Number of Children: 0  . Years of Education: college   Occupational History  . retired    Social History Main Topics  . Smoking status: Never Smoker   . Smokeless tobacco: Not on file  . Alcohol Use: No  . Drug Use: No  . Sexual Activity: Yes     Comment: menarche age 42, hysterectomy age 36, HRT x 2-3 mos, G1- miscarriage   Other Topics Concern  . Not on file   Social History Narrative   Widowed, lives alone.  Ambulates independently.    Drinks decaf only     PHYSICAL EXAM  Filed Vitals:   08/15/14 1414  BP: 144/82  Pulse: 80  Height: 5\' 7"  (1.702 m)  Weight: 133 lb 6.4 oz (60.51 kg)    Body mass index is 20.89 kg/(m^2).  No exam data present  No flowsheet data found.  GENERAL EXAM: Patient is in no distress; well developed, nourished and groomed; neck is supple  CARDIOVASCULAR: Regular rate and rhythm, no murmurs, no carotid bruits  NEUROLOGIC: MENTAL STATUS: awake, alert, oriented to person, place and time, recent and remote memory intact, normal attention and concentration, language fluent, comprehension intact, naming intact, fund of knowledge appropriate; argumentative  CRANIAL NERVE: no papilledema on fundoscopic exam, pupils equal and reactive to light, visual fields full to confrontation, extraocular muscles intact, no nystagmus, facial sensation and strength symmetric, hearing intact, palate elevates symmetrically, uvula midline, shoulder shrug symmetric, tongue midline. MOTOR: normal bulk and tone, full strength in the BUE; BLE WEAKNESS (HF 3, KE 4, KF 4, DF 3); BRADYKINESIA IN BUE AND BLE SENSORY: ABSENT VIB IN TOES; DECR VIB AT ANKLES;  DECR PP, LT IN LOWER EXT COORDINATION: finger-nose-finger, fine finger movements normal REFLEXES: BUE 1, BLE 0 GAIT/STATION: narrow based gait; SHORT STEPS, SLOW, UNSTEADY; DIFF WITH HEEL GAIT     DIAGNOSTIC DATA (LABS, IMAGING, TESTING) - I reviewed patient records, labs, notes, testing and imaging myself where available.  Lab Results  Component Value Date   WBC 8.4 07/19/2014   HGB 11.4* 07/19/2014   HCT 34.7* 07/19/2014   MCV 91.3 07/19/2014   PLT 258 07/19/2014      Component Value Date/Time   NA 132* 12/27/2013 1356   NA 138 01/04/2013 1212   K 4.5 12/27/2013 1356   K 3.9 01/04/2013 1212   CL 100 12/27/2013 1356   CO2 23 12/27/2013 1356   CO2 22 01/04/2013 1212   GLUCOSE 130* 12/27/2013 1356   GLUCOSE 285* 01/04/2013 1212   BUN 11 12/27/2013 1356   BUN 10.3 01/04/2013 1212   CREATININE 0.7 12/27/2013 1356   CREATININE 0.78 12/01/2013 1659   CREATININE 1.0 01/04/2013 1212   CALCIUM 9.2 12/27/2013 1356   CALCIUM 9.6 01/04/2013 1212   PROT 6.0 12/27/2013 1356   PROT 6.4 01/04/2013 1212   ALBUMIN 3.5 12/27/2013 1356   ALBUMIN 3.6 01/04/2013 1212   AST 20 12/27/2013 1356   AST 14 01/04/2013 1212   ALT 14 12/27/2013 1356   ALT 16 01/04/2013 1212   ALKPHOS 47 12/27/2013 1356   ALKPHOS 52  01/04/2013 1212   BILITOT 0.6 12/27/2013 1356   BILITOT 1.01 01/04/2013 1212   GFRNONAA 81* 10/19/2013 0500   GFRAA >90 10/19/2013 0500   Lab Results  Component Value Date   CHOL 148 01/30/2013   HDL 47.10 01/30/2013   LDLCALC 65 01/30/2013   LDLDIRECT 85.7 09/05/2009   TRIG 179.0* 01/30/2013   CHOLHDL 3 01/30/2013   Lab Results  Component Value Date   HGBA1C 7.1* 07/17/2014   Lab Results  Component Value Date   VITAMINB12 501 09/05/2009   Lab Results  Component Value Date   TSH 3.83 07/17/2014    03/13/14 CT head / cervical spine [I reviewed images myself and agree with interpretation. -VRP]  1. No acute intracranial injury or cervical spine fracture. 2.  Sequela of right frontal meningioma and resection.  10/10/13 MRI brain [I reviewed images myself and agree with interpretation. -VRP]  - Acute right hemispheric subdural hematoma. Meningioma in the right frontal lobe along the roof of the right orbit. This extends across the cribriform plate with mild extension to the left of midline. There is vasogenic edema and a peritumoral cyst above the mass. 7 mm midline shift to the left.  10/16/13 EEG  - This awake and asleep EEG is abnormal due to the presence of: 1. Mild to moderate diffuse slowing of the waking background  2. Additional occasional focal slowing over the right frontocentrotemporal regions - Clinical Correlation of the above findings indicates bilateral cerebral dysfunction with additional focal cerebral dysfunction over the right frontocentral temporal region. Diffuse cerebral dysfunction is non-specific in etiology and can be seen with hypoxic/ischemic injury, toxic/metabolic encephalopathies, neurodegenerative disorders, or medication effect. Additional focal slowing over the right frontocentrotemporal region indicates focal cerebral dysfunction suggestive of underlying structural or physiologic abnormality. The absence of epileptiform discharges does not rule out a clinical diagnosis of epilepsy. Clinical correlation is advised.    ASSESSMENT AND PLAN  81 y.o. year old female here with intermittent falls, unresponsive spells, confusion, comp legs partial seizures, found to have large right frontal meningioma and acute right hemisphere subdural hematoma in June 2015. She is status post surgery. Patient was prescribed antiseizure medication but she discontinued this without letting anyone know, due to side effect of medication. Now patient with unexplained tongue laceration in March 2016, likely due to breakthrough seizure.  PLAN: - start vimpat 50mg  BID x 1 month; then 100mg  BID - MRI brain follow up - no driving until seizure free x 6  months; patient has been cutting down her driving anyways, and given her lower extremity weakness, which is likely related to diabetic peripheral neuropathy, I recommend that she transition away from driving altogether. - seizure precautions reviewed  Orders Placed This Encounter  Procedures  . MR Brain W Wo Contrast   Meds ordered this encounter  Medications  . lacosamide (VIMPAT) 50 MG TABS tablet    Sig: Take 1 tablet (50 mg total) by mouth 2 (two) times daily.    Dispense:  60 tablet    Refill:  0  . Lacosamide (VIMPAT) 100 MG TABS    Sig: Take 1 tablet (100 mg total) by mouth 2 (two) times daily.    Dispense:  60 tablet    Refill:  12   Return in about 3 months (around 11/14/2014).    Penni Bombard, MD 10/28/9483, 4:62 PM Certified in Neurology, Neurophysiology and Neuroimaging  [facility] [address] [place], [place] [address] [phone]

## 2014-08-16 ENCOUNTER — Telehealth: Payer: Self-pay | Admitting: Diagnostic Neuroimaging

## 2014-08-16 NOTE — Telephone Encounter (Signed)
Patient requesting a return call regarding information on sheet she filled out at [facility] on 08/15/14.  Patient wouldn't give me any additional information.  Please call and advise.

## 2014-08-16 NOTE — Telephone Encounter (Signed)
Spoke with Dr. Leta Baptist who told me to tell the pt that she had already had an EEG in the hospital which was abnormal and that he would recommend her take the anti seizure medication.  I called the pt and relayed this information and stated that she would try the medication and see how it worked for her.

## 2014-08-16 NOTE — Telephone Encounter (Signed)
Pt is calling wanting to know if there is some kind of test that she can take to show she had a seizure.  Please call and advise.

## 2014-08-16 NOTE — Telephone Encounter (Signed)
Spoke with the pt and she asked me to remove her sisters name from the [facility]. She does not want her to be able to receive any information about her. I told her I would contact Medical Records. She thanked me

## 2014-08-21 ENCOUNTER — Telehealth: Payer: Self-pay | Admitting: Diagnostic Neuroimaging

## 2014-08-21 NOTE — Telephone Encounter (Signed)
Patient is calling regarding her need to drive again.  She stated the doctor does not want her to drive but she needs to to get errands done.  Please call.

## 2014-08-22 NOTE — Telephone Encounter (Signed)
Spoke to the pt on the phone and asked her what she wanted me to do about her driving. She stated that she needs Dr. Leta Baptist to give an order for "more hours to cover her service to have someone drive her back and forth to errands and appts". I asked her about the company she is using. She stated she is going to a different company and they were coming out on Monday. I told her to have them call the office about requirements for more coverage on hours. She said she would have [employer] call us on Monday.

## 2014-08-28 DIAGNOSIS — S01512D Laceration without foreign body of oral cavity, subsequent encounter: Secondary | ICD-10-CM | POA: Diagnosis not present

## 2014-08-29 ENCOUNTER — Ambulatory Visit
Admission: RE | Admit: 2014-08-29 | Discharge: 2014-08-29 | Disposition: A | Discharge status: Home / Self Care | Payer: Medicare Other | Source: Ambulatory Visit | Attending: Diagnostic Neuroimaging | Admitting: Diagnostic Neuroimaging

## 2014-08-29 DIAGNOSIS — D329 Benign neoplasm of meninges, unspecified: Secondary | ICD-10-CM | POA: Diagnosis not present

## 2014-08-29 MED ORDER — GADOBENATE DIMEGLUMINE 529 MG/ML IV SOLN
12.0000 mL | Freq: Once | INTRAVENOUS | Status: AC | PRN
  Administered 2014-08-29: 12 mL via INTRAVENOUS

## 2014-08-30 ENCOUNTER — Telehealth: Payer: Self-pay | Admitting: Diagnostic Neuroimaging

## 2014-08-30 NOTE — Telephone Encounter (Signed)
Patient called asking about results from MRI. Please call and advise.

## 2014-08-31 ENCOUNTER — Telehealth: Payer: Self-pay | Admitting: Neurology

## 2014-08-31 NOTE — Telephone Encounter (Signed)
I called the patient. She had some nausea on the Vimpat 50 mg bid. She is to go to one a day for 3 days, then go up to 50 mg bid. I discussed the MRI results with her.   MRI brain 08/30/14:  IMPRESSION:  Abnormal MRI brain (with and without) demonstrating: 1. Sequelae of right inferior frontal meningioma resection and overlying craniotomy changes. Residual right frontal parenchymal edema/gliosis and overlying dural enhancement in the right frontal region.  2. No meningioma recurrence. No acute findings.  3. Compared to MRI from 10/10/13, there is significant improvement in mass effect from prior meningioma following tumor resection and also resolution of right hemisphere subdural hemorrhage.

## 2014-09-03 ENCOUNTER — Other Ambulatory Visit: Payer: Self-pay | Admitting: Family

## 2014-09-03 DIAGNOSIS — I6522 Occlusion and stenosis of left carotid artery: Secondary | ICD-10-CM

## 2014-09-03 NOTE — Telephone Encounter (Signed)
I called patient. No answer. -VRP

## 2014-09-03 NOTE — Telephone Encounter (Signed)
Patient called and stated that she spoke with Dr. Jannifer Franklin last week about MRI results but she has not heard back about her lab results and would like to speak with you regarding those. Please call and advise.

## 2014-09-04 NOTE — Telephone Encounter (Signed)
Note from 09/03/14 at 0455 pm.: Called and spoke with the pt who was very angry that I personally did not call her back. I asked her why she was upset because Dr. Jannifer [name] had already called her MRI results to her on Friday 08/31/14. She stated that I had told her I would call her back. I stated that I cannot call abnormal results. She stated that she did not care because I needed to call her back. I asked what it was she needed from me, she stated labs. I looked in the chart and was able to locate labs from march 15 and 17. She became angry and stated that she had labwork in April. I asked her where she had her lab work done and she told me the [facility]. I told her i would call them tomorrow 09/04/14 and get them to fax it to me but that if it was abnormal, it would need to be interpreted by the doctor before i could inform her of the results. She stated she did not care for my attitude and i apologized stating that this was how things were done, that i could not interpret abnormal labs and it would take some time to get back to her. She stated she no longer wanted to see Dr. Leta [name] or me. She wanted to become a Dr. Jannifer [name] pt, i asked if she wanted me to make her a new patient appt. She said yes. Made her an appt with Dr. Jannifer [name] and left Charisse Klinefelter, RN a note about this. Informed Dr. Leta [name] who stated that he would try to call the pt

## 2014-09-11 ENCOUNTER — Ambulatory Visit (HOSPITAL_COMMUNITY): Payer: Medicare Other | Attending: Cardiovascular Disease

## 2014-09-11 DIAGNOSIS — I6522 Occlusion and stenosis of left carotid artery: Secondary | ICD-10-CM

## 2014-09-11 DIAGNOSIS — I6523 Occlusion and stenosis of bilateral carotid arteries: Secondary | ICD-10-CM | POA: Diagnosis not present

## 2014-09-17 ENCOUNTER — Encounter: Payer: Self-pay | Admitting: Podiatry

## 2014-09-17 ENCOUNTER — Ambulatory Visit (INDEPENDENT_AMBULATORY_CARE_PROVIDER_SITE_OTHER): Payer: Medicare Other | Admitting: Podiatry

## 2014-09-17 VITALS — BP 127/76 | HR 77 | Temp 96.6°F | Resp 12

## 2014-09-17 DIAGNOSIS — L84 Corns and callosities: Secondary | ICD-10-CM

## 2014-09-17 DIAGNOSIS — G629 Polyneuropathy, unspecified: Secondary | ICD-10-CM

## 2014-09-17 DIAGNOSIS — E1142 Type 2 diabetes mellitus with diabetic polyneuropathy: Secondary | ICD-10-CM

## 2014-09-17 NOTE — Patient Instructions (Signed)
Diabetes and Foot Care Diabetes Pettinato cause you to have problems because of poor blood supply (circulation) to your feet and legs. This Hugh cause the skin on your feet to become thinner, break easier, and heal more slowly. Your skin Haroon become dry, and the skin Ganci peel and crack. You Bhargava also have nerve damage in your legs and feet causing decreased feeling in them. You Pernice not notice minor injuries to your feet that could lead to infections or more serious problems. Taking care of your feet is one of the most important things you can do for yourself.  HOME CARE INSTRUCTIONS  Wear shoes at all times, even in the house. Do not go barefoot. Bare feet are easily injured.  Check your feet daily for blisters, cuts, and redness. If you cannot see the bottom of your feet, use a mirror or ask someone for help.  Wash your feet with warm water (do not use hot water) and mild soap. Then pat your feet and the areas between your toes until they are completely dry. Do not soak your feet as this can dry your skin.  Apply a moisturizing lotion or petroleum jelly (that does not contain alcohol and is unscented) to the skin on your feet and to dry, brittle toenails. Do not apply lotion between your toes.  Trim your toenails straight across. Do not dig under them or around the cuticle. File the edges of your nails with an emery board or nail file.  Do not cut corns or calluses or try to remove them with medicine.  Wear clean socks or stockings every day. Make sure they are not too tight. Do not wear knee-high stockings since they Meskill decrease blood flow to your legs.  Wear shoes that fit properly and have enough cushioning. To break in new shoes, wear them for just a few hours a day. This prevents you from injuring your feet. Always look in your shoes before you put them on to be sure there are no objects inside.  Do not cross your legs. This Gerry decrease the blood flow to your feet.  If you find a minor scrape,  cut, or break in the skin on your feet, keep it and the skin around it clean and dry. These areas Crayton be cleansed with mild soap and water. Do not cleanse the area with peroxide, alcohol, or iodine.  When you remove an adhesive bandage, be sure not to damage the skin around it.  If you have a wound, look at it several times a day to make sure it is healing.  Do not use heating pads or hot water bottles. They Coffelt burn your skin. If you have lost feeling in your feet or legs, you Garciaperez not know it is happening until it is too late.  Make sure your health care provider performs a complete foot exam at least annually or more often if you have foot problems. Report any cuts, sores, or bruises to your health care provider immediately. SEEK MEDICAL CARE IF:   You have an injury that is not healing.  You have cuts or breaks in the skin.  You have an ingrown nail.  You notice redness on your legs or feet.  You feel burning or tingling in your legs or feet.  You have pain or cramps in your legs and feet.  Your legs or feet are numb.  Your feet always feel cold. SEEK IMMEDIATE MEDICAL CARE IF:   There is increasing redness,   swelling, or pain in or around a wound.  There is a red line that goes up your leg.  Pus is coming from a wound.  You develop a fever or as directed by your health care provider.  You notice a bad smell coming from an ulcer or wound. Document Released: 04/17/2000 Document Revised: 12/21/2012 Document Reviewed: 09/27/2012 [employer] Patient Information 2015 [employer]. This information is not intended to replace advice given to you by your health care provider. Make sure you discuss any questions you have with your health care provider.  

## 2014-09-18 ENCOUNTER — Ambulatory Visit
Admission: RE | Admit: 2014-09-18 | Discharge: 2014-09-18 | Disposition: A | Discharge status: Home / Self Care | Payer: Medicare Other | Source: Ambulatory Visit | Attending: General Surgery | Admitting: General Surgery

## 2014-09-18 DIAGNOSIS — R921 Mammographic calcification found on diagnostic imaging of breast: Secondary | ICD-10-CM | POA: Diagnosis not present

## 2014-09-18 NOTE — Progress Notes (Signed)
Patient ID: [name], female   DOB: [birth year], 81 y.o.   MRN: [id number]  Subjective: This patient presents for ongoing debridement of pre-ulcerative plantar callus of left first MPJ under our care since 02/19/2014. Patient is wearing current diabetic insole with additional felt pad attached to the plantar aspect left first MPJ to offload the area. We attempted to obtain certification from patient's primary physician who refused to sign to certification paperwork. I referred patient to biotech for diabetic shoes and custom insoles. At this time patient is not presented to biotech  Objective: Orientated 3 Plantar left first MPJ has bleeding callus that after debridement remains closed Plantar callus sub-right first MPJ without any bleeding within the callus  Assessment: Diabetic with a history of peripheral neuropathy Pre-ulcerative plantar keratoses left Plantar callus right  Plan: Debridement of pre-ulcerative plantar keratoses and keratoses without any bleeding Maintain custom insoles with additional pads attached to insoles  Reappoint 4 weeks

## 2014-09-26 DIAGNOSIS — E1159 Type 2 diabetes mellitus with other circulatory complications: Secondary | ICD-10-CM | POA: Diagnosis not present

## 2014-09-26 DIAGNOSIS — R51 Headache: Secondary | ICD-10-CM | POA: Diagnosis not present

## 2014-09-26 DIAGNOSIS — N390 Urinary tract infection, site not specified: Secondary | ICD-10-CM | POA: Diagnosis not present

## 2014-09-26 DIAGNOSIS — I10 Essential (primary) hypertension: Secondary | ICD-10-CM | POA: Diagnosis not present

## 2014-10-03 DIAGNOSIS — Z0289 Encounter for other administrative examinations: Secondary | ICD-10-CM

## 2014-10-16 ENCOUNTER — Ambulatory Visit (INDEPENDENT_AMBULATORY_CARE_PROVIDER_SITE_OTHER): Payer: Medicare Other | Admitting: Neurology

## 2014-10-16 ENCOUNTER — Telehealth: Payer: Self-pay | Admitting: Neurology

## 2014-10-16 ENCOUNTER — Encounter: Payer: Self-pay | Admitting: Neurology

## 2014-10-16 VITALS — BP 163/85 | HR 75 | Ht 67.0 in | Wt 133.4 lb

## 2014-10-16 DIAGNOSIS — G40909 Epilepsy, unspecified, not intractable, without status epilepticus: Secondary | ICD-10-CM | POA: Insufficient documentation

## 2014-10-16 DIAGNOSIS — R569 Unspecified convulsions: Secondary | ICD-10-CM | POA: Diagnosis not present

## 2014-10-16 DIAGNOSIS — I6522 Occlusion and stenosis of left carotid artery: Secondary | ICD-10-CM

## 2014-10-16 HISTORY — DX: Unspecified convulsions: R56.9

## 2014-10-16 MED ORDER — LAMOTRIGINE 25 MG PO TABS: ORAL_TABLET | ORAL | Status: DC

## 2014-10-16 NOTE — Progress Notes (Signed)
Reason for visit: Seizures  [name] is an 81 y.o. female  History of present illness:  [name] is an 81 year old right-handed white female with a history of a right frontal meningioma status post resection. The patient had onset of tongue biting coming out of sleep on 07/18/2014. The patient was seen in April by Dr. Leta Baptist, and she was felt that had a seizure event. MRI of the brain was repeated, showing encephalomalacia and gliomatosis of the right frontal lobe. The patient underwent treatment with Keppra, but she could not tolerate the medication. She felt that the medication made her too drowsy. The patient was placed on Vimpat, but the patient suffered similar side effects. She currently is on no medications for seizures. She has not had any definite seizure recurrence. She does report some mild word finding problems that have occurred since resection of the meningioma. She denies any falls, or persistent numbness or weakness of the face, arms, or legs. She has aid and assistance coming into the house 6 hours on Mondays, Tuesdays, Thursdays and Fridays and 4 hours on Wednesdays. The patient returns for further management.  Past Medical History  Diagnosis Date  . Seasonal allergies   . HTN (hypertension)   . Osteoarthritis   . Palpitations   . Anemia   . History of colonic polyps   . GERD (gastroesophageal reflux disease)   . Hyperlipidemia   . Chronic facial pain   . Vitamin B12 deficiency   . Dyslipidemia   . Chronic foot pain   . Chronic cough   . Obesity   . Anxiety   . Complication of anesthesia     Sore jaw; could not chew or move mouth  . Diabetes mellitus     type 2 niddm x 20 years  . Metatarsal bone fracture 2014  . Hammer toe     bilateral  . Hx of radiation therapy 03/07/13- 03/29/13    right breast 4250 cGy 17 sessions  . Ejection fraction   . Skin cancer   . Melanoma   . Cancer of right breast 12/26/12    right breast 12:00 o'clock, DCIS  .  Convulsions/seizures 10/16/2014    Past Surgical History  Procedure Laterality Date  . Colonoscopy    . Polypectomy      small adenomatous  . Abdominal hysterectomy    . Cholecystectomy    . Hernia repair    . Knee arthroscopy Bilateral   . Ulnar tunnel release    . Eye surgery    . Cataract extraction w/ intraocular lens implant Right   . Bunionectomy    . Breast lumpectomy with needle localization Right 01/17/2013    Procedure: BREAST LUMPECTOMY WITH NEEDLE LOCALIZATION;  Surgeon: Edward Jolly, MD;  Location: [facility];  Service: General;  Laterality: Right;  . Breast biopsy Right 01/24/2013    Procedure: RE-EXCICION OF BREAST CANCER, ANTERIOR MARGINS;  Surgeon: Edward Jolly, MD;  Location: [facility];  Service: General;  Laterality: Right;  . Craniotomy Right 10/18/2013    Procedure: CRANIOTOMY TUMOR EXCISION;  Surgeon: Floyce Stakes, MD;  Location: [facility];  Service: Neurosurgery;  Laterality: Right;    Family History  Problem Relation Age of Onset  . Colon cancer    . Heart disease Mother   . Diabetes Father   . Pancreatic cancer Father   . Bone cancer Sister   . Prostate cancer Brother   . Colon cancer Brother   .  Rectal cancer Sister   . Thyroid disease Sister     benign goiter resected    Social history:  reports that she has never smoked. She does not have any smokeless tobacco history on file. She reports that she does not drink alcohol or use illicit drugs.    Allergies  Allergen Reactions  . Bystolic [Nebivolol Hcl] Other (See Comments)    "extreme weakness, heaviness in legs & arms, swelling in legs/arms/face, swollen abdomen, pain in bladder, feet pain, soreness in chest"  . Cholestyramine     "itching rash on stomach, bloated, nausea, vomiting, sleeplessness, extreme pain in arms"  . Hydrazine Yellow [Tartrazine] Other (See Comments)    "does not reduce high blood pressure, pain in arm, high pressure, felt like I was on verge of heart attack,  really weak"  . Morphine Other (See Comments)    Feels morbid, weak, still in pain  . Niacin Palpitations    Fast heart beat  . Niaspan [Niacin Er] Palpitations and Other (See Comments)    "fast heart beat, high blood pressure"  . Norvasc [Amlodipine Besylate] Other (See Comments)    "extreme fluid retention/pain)  . Optivar [Azelastine Hcl] Photosensitivity  . Sular [Nisoldipine Er] Other (See Comments)    "severe headaches, swelling eyes, hands, feet, shortness of breath, weak, flushed face, brain boiling, fluid retention, high blood sugar, nervous, heart fast beating"  . Tegretol [Carbamazepine] Other (See Comments)    Blood poisoning   . Telmisartan Other (See Comments)    "headache, difficulty urinating, high blood sugar, fluid retention"  . Cefdinir Swelling    Vaginal irritation, breathing,   . Clonidine Other (See Comments)    Dry mouth, fluid retention  . Clonidine Hydrochloride Other (See Comments)    Dry mouth, fluid retention  . Codeine Nausea And Vomiting  . Ezetimibe Other (See Comments)    Made weak  . Naproxen Other (See Comments)    Shrinks bladder  . Ziac [Bisoprolol-Hydrochlorothiazide] Other (See Comments)    "stopped urination"  . Elavil [Amitriptyline] Other (See Comments)    Gave Pt nightmares  . Keppra [Levetiracetam]     Shaking  . Lyrica [Pregabalin] Swelling  . Ace Inhibitors Other (See Comments)    unknown  . Actonel [Risedronate Sodium] Other (See Comments)    unknown  . Amlodipine Besylate Other (See Comments)    unknown  . Aspirin Other (See Comments)    unknown  . Atacand [Candesartan] Other (See Comments)    unknown  . Bextra [Valdecoxib] Other (See Comments)    unknown  . Bisoprolol-Hydrochlorothiazide Other (See Comments)    unknown  . Candesartan Cilexetil Other (See Comments)    unknown  . Cefadroxil Other (See Comments)    unknown  . Celecoxib Rash  . Hydrocodone Other (See Comments)    unknown  . Hydrocodone-Acetaminophen  Other (See Comments)    unknown  . Iodinated Diagnostic Agents Rash    "All over"  . Meloxicam Other (See Comments)    unknown  . Methylprednisolone Sodium Succinate Other (See Comments)    unknown  . Nabumetone Other (See Comments)    unknown  . Penicillins Other (See Comments)    unknown  . Pseudoephedrine-Guaifenesin Other (See Comments)    unknown  . Risedronate Sodium Other (See Comments)    unknown  . Rofecoxib Other (See Comments)    unknown  . Ru-Tuss [Chlorphen-Pse-Atrop-Hyos-Scop] Other (See Comments)    unknown  . Sulfonamide Derivatives Other (See Comments)  unknown  . Sulphur [Sulfur] Other (See Comments)    unknown  . Telithromycin Other (See Comments)    unknown  . Terfenadine Other (See Comments)    unknown  . Trandolapril-Verapamil Hcl Er Other (See Comments)    Headache, difficulty urinating, high blood sugar, fluid retention  . Valium [Diazepam] Other (See Comments)    Makes her mean and hyper    Medications:  Prior to Admission medications   Medication Sig Start Date End Date Taking? Authorizing Provider  acetaminophen (TYLENOL) 500 MG tablet Take 1,000 mg by mouth every 6 (six) hours as needed for pain.   Yes Historical Provider, MD  ALPRAZolam (XANAX) 0.25 MG tablet Take 0.25 mg by mouth at bedtime.  02/13/11  Yes Ricard Dillon, MD  calcium carbonate (OS-CAL) 600 MG TABS Take 600 mg by mouth daily.    Yes Historical Provider, MD  colestipol (COLESTID) 1 G tablet Take 2 g by mouth 2 (two) times daily.   Yes Historical Provider, MD  cyanocobalamin (,VITAMIN B-12,) 1000 MCG/ML injection Inject 1,000 mcg into the muscle every 21 ( twenty-one) days.   Yes Historical Provider, MD  fexofenadine (ALLEGRA) 60 MG tablet Take 60 mg by mouth every other day.    Yes Historical Provider, MD  furosemide (LASIX) 20 MG tablet Take 1 tablet (20 mg total) by mouth daily. Do not take until I call you about labs. Patient taking differently: Take 20 mg by mouth daily as  needed for fluid or edema. Do not take until I call you about labs. 12/27/13  Yes Marin Olp, MD  Lacosamide (VIMPAT) 100 MG TABS Take 1 tablet (100 mg total) by mouth 2 (two) times daily. 08/15/14  Yes Penni Bombard, MD  lacosamide (VIMPAT) 50 MG TABS tablet Take 1 tablet (50 mg total) by mouth 2 (two) times daily. 08/15/14  Yes Vikram R Penumalli, MD  lidocaine (LIDODERM) 5 % Place 1 patch onto the skin daily. Remove & Discard patch within 12 hours or as directed by MD Patient taking differently: Place 1 patch onto the skin daily as needed (for pain). Remove & Discard patch within 12 hours or as directed by MD 11/17/13  Yes Ricard Dillon, MD  metFORMIN (GLUCOPHAGE-XR) 500 MG 24 hr tablet 4 tabs daily Patient taking differently: Take 500 mg by mouth 4 (four) times daily -  with meals and at bedtime.  04/09/14  Yes Renato Shin, MD  nateglinide (STARLIX) 120 MG tablet Take 1 tablet (120 mg total) by mouth 3 (three) times daily with meals. 07/17/14  Yes Renato Shin, MD  Polyethyl Glycol-Propyl Glycol 0.4-0.3 % SOLN Place 2 drops into both eyes 2 (two) times daily as needed (for pain and dry eyes).   Yes Historical Provider, MD  ranitidine (ZANTAC) 150 MG capsule Take 150 mg by mouth daily as needed for heartburn.    Yes Historical Provider, MD  Preston Fleeting 2-180 MG per tablet TAKE 1 TABLET DAILY 02/06/14  Yes Carlena Bjornstad, MD    ROS:  Out of a complete 14 system review of symptoms, the patient complains only of the following symptoms, and all other reviewed systems are negative.  Weight change Eye itching Leg swelling Back pain, neck pain Memory loss, headache, weakness, tremors Confusion, anxiety  Blood pressure 163/85, pulse 75, height 5\' 7"  (1.702 m), weight 133 lb 6.4 oz (60.51 kg).  Physical Exam  General: The patient is alert and cooperative at the time of the examination.  Skin: No  significant peripheral edema is noted.   Neurologic Exam  Mental status: The patient is alert  and oriented x 3 at the time of the examination. The patient has apparent normal recent and remote memory, with an apparently normal attention span and concentration ability.   Cranial nerves: Facial symmetry is present. Speech is normal, no aphasia or dysarthria is noted. Extraocular movements are full. Visual fields are full.  Motor: The patient has good strength in all 4 extremities.  Sensory examination: Soft touch sensation is symmetric on the face, arms, and legs.  Coordination: The patient has good finger-nose-finger and heel-to-shin bilaterally.  Gait and station: The patient has a normal gait. Tandem gait is slightly unsteady. Romberg is negative. No drift is seen.  Reflexes: Deep tendon reflexes are symmetric.   MRI brain 08/30/14:  IMPRESSION:  Abnormal MRI brain (with and without) demonstrating: 1. Sequelae of right inferior frontal meningioma resection and overlying craniotomy changes. Residual right frontal parenchymal edema/gliosis and overlying dural enhancement in the right frontal region.  2. No meningioma recurrence. No acute findings.  3. Compared to MRI from 10/10/13, there is significant improvement in mass effect from prior meningioma following tumor resection and also resolution of right hemisphere subdural hemorrhage.  * MRI scan images were reviewed online. I agree with the written report.    Assessment/Plan:  1. Right frontal meningioma resection  2. Seizures secondary to #1  The patient is to go on another seizure medication at this time. She has quite an impressive list of allergies to various medications. The patient will be placed on Lamictal, going up slowly. The patient will contact her office when she gets to 75 mg twice daily. We will convert her to 100 mg twice daily. The patient is not to operate a motor vehicle for least 6 months following the last seizure event. She will follow-up in 3 months.  Yolanda Alexanders MD 10/16/2014 7:26  PM  [facility] [address] [place], [place] [address]  Phone [phone] Fax [phone]

## 2014-10-16 NOTE — Patient Instructions (Addendum)
We will start Lamictal for the seizures, begin a 25 mg twice daily for 2 weeks, then take 50 mg twice daily for 2 weeks, then go to 75 mg twice daily. After another 2 weeks, if you are tolerating the medication, call our office and I will call in a maintenance dose of the medication for you.  Epilepsy Epilepsy is a disorder in which a person has repeated seizures over time. A seizure is a release of abnormal electrical activity in the brain. Seizures can cause a change in attention, behavior, or the ability to remain awake and alert (altered mental status). Seizures often involve uncontrollable shaking (convulsions).  Most people with epilepsy lead normal lives. However, people with epilepsy are at an increased risk of falls, accidents, and injuries. Therefore, it is important to begin treatment right away. CAUSES  Epilepsy has many possible causes. Anything that disturbs the normal pattern of brain cell activity can lead to seizures. This Chewning include:   Head injury.  Birth trauma.  High fever as a child.  Stroke.  Bleeding into or around the brain.  Certain drugs.  Prolonged low oxygen, such as what occurs after CPR efforts.  Abnormal brain development.  Certain illnesses, such as meningitis, encephalitis (brain infection), malaria, and other infections.  An imbalance of nerve signaling chemicals (neurotransmitters).  SIGNS AND SYMPTOMS  The symptoms of a seizure can vary greatly from one person to another. Right before a seizure, you Kole have a warning (aura) that a seizure is about to occur. An aura Lembo include the following symptoms:  Fear or anxiety.  Nausea.  Feeling like the room is spinning (vertigo).  Vision changes, such as seeing flashing lights or spots. Common symptoms during a seizure include:  Abnormal sensations, such as an abnormal smell or a bitter taste in the mouth.   Sudden, general body stiffness.   Convulsions that involve rhythmic jerking of  the face, arm, or leg on one or both sides.   Sudden change in consciousness.   Appearing to be awake but not responding.   Appearing to be asleep but cannot be awakened.   Grimacing, chewing, lip smacking, drooling, tongue biting, or loss of bowel or bladder control. After a seizure, you Fluegel feel sleepy for a while. DIAGNOSIS  Your health care provider will ask about your symptoms and take a medical history. Descriptions from any witnesses to your seizures will be very helpful in the diagnosis. A physical exam, including a detailed neurological exam, is necessary. Various tests Bisher be done, such as:   An electroencephalogram (EEG). This is a painless test of your brain waves. In this test, a diagram is created of your brain waves. These diagrams can be interpreted by a specialist.  An MRI of the brain.   A CT scan of the brain.   A spinal tap (lumbar puncture, LP).  Blood tests to check for signs of infection or abnormal blood chemistry. TREATMENT  There is no cure for epilepsy, but it is generally treatable. Once epilepsy is diagnosed, it is important to begin treatment as soon as possible. For most people with epilepsy, seizures can be controlled with medicines. The following Prewitt also be used:  A pacemaker for the brain (vagus nerve stimulator) can be used for people with seizures that are not well controlled by medicine.  Surgery on the brain. For some people, epilepsy eventually goes away. HOME CARE INSTRUCTIONS   Follow your health care provider's recommendations on driving and safety in  normal activities.  Get enough rest. Lack of sleep can cause seizures.  Only take over-the-counter or prescription medicines as directed by your health care provider. Take any prescribed medicine exactly as directed.  Avoid any known triggers of your seizures.  Keep a seizure diary. Record what you recall about any seizure, especially any possible trigger.   Make sure the people  you live and work with know that you are prone to seizures. They should receive instructions on how to help you. In general, a witness to a seizure should:   Cushion your head and body.   Turn you on your side.   Avoid unnecessarily restraining you.   Not place anything inside your mouth.   Call for emergency medical help if there is any question about what has occurred.   Follow up with your health care provider as directed. You Rumery need regular blood tests to monitor the levels of your medicine.  SEEK MEDICAL CARE IF:   You develop signs of infection or other illness. This might increase the risk of a seizure.   You seem to be having more frequent seizures.   Your seizure pattern is changing.  SEEK IMMEDIATE MEDICAL CARE IF:   You have a seizure that does not stop after a few moments.   You have a seizure that causes any difficulty in breathing.   You have a seizure that results in a very severe headache.   You have a seizure that leaves you with the inability to speak or use a part of your body.  Document Released: 04/20/2005 Document Revised: 02/08/2013 Document Reviewed: 11/30/2012 [facility] Patient Information 2015 [place], [place]. This information is not intended to replace advice given to you by your health care provider. Make sure you discuss any questions you have with your health care provider.

## 2014-10-16 NOTE — Telephone Encounter (Signed)
I called the patient. She wanted to know if it is ok to get a Iran for the hair. I indicated that it is ok with me, no medical contraindications.

## 2014-10-16 NOTE — Telephone Encounter (Signed)
Patient called requesting to speak with [facility]. She chose not to give details.She stated she forgot to ask at last office visit. Please call and advise. Patient can be reached at [phone].

## 2014-10-22 ENCOUNTER — Ambulatory Visit: Payer: Medicare Other | Admitting: Podiatry

## 2014-10-24 DIAGNOSIS — I10 Essential (primary) hypertension: Secondary | ICD-10-CM | POA: Diagnosis not present

## 2014-10-24 DIAGNOSIS — F419 Anxiety disorder, unspecified: Secondary | ICD-10-CM | POA: Diagnosis not present

## 2014-10-24 DIAGNOSIS — R51 Headache: Secondary | ICD-10-CM | POA: Diagnosis not present

## 2014-10-29 ENCOUNTER — Encounter: Payer: Self-pay | Admitting: Endocrinology

## 2014-10-29 ENCOUNTER — Ambulatory Visit (INDEPENDENT_AMBULATORY_CARE_PROVIDER_SITE_OTHER): Payer: Medicare Other | Admitting: Endocrinology

## 2014-10-29 VITALS — BP 132/70 | HR 73 | Temp 97.9°F | Ht 67.0 in | Wt 135.0 lb

## 2014-10-29 DIAGNOSIS — I6522 Occlusion and stenosis of left carotid artery: Secondary | ICD-10-CM | POA: Diagnosis not present

## 2014-10-29 DIAGNOSIS — E1151 Type 2 diabetes mellitus with diabetic peripheral angiopathy without gangrene: Secondary | ICD-10-CM

## 2014-10-29 LAB — HEMOGLOBIN A1C: Hgb A1c MFr Bld: 6.7 % — ABNORMAL HIGH (ref 4.6–6.5)

## 2014-10-29 NOTE — Progress Notes (Signed)
Subjective:    Patient ID: [name], female    DOB: [birth year], 81 y.o.   MRN: [id number]  HPI Pt returns for f/u of multinodular goiter (pt had a syncopal episode in April of 2015; she was eval with carotid US, and there was incidentally noted to have nodules in the thyroid; she is euthyroid; bx was not done, due to advanced age and FHx of benign goiter; [place] in late 2015 was not significantly changed). She does not notice the nodules.  Pt returns for f/u of diabetes mellitus:  DM type: 2 Dx'ed: 4536 Complications: polyneuropathy and PAD.  Therapy: 2 oral meds DKA: never Severe hypoglycemia: never.   Pancreatitis: never.   Other: she took insulin for only a brief time after dx; she declines [place].  Interval history:  She has lost weight. she brings a record of her cbg's which i have reviewed today.  It varies from 153-290.  There is no trend throughout the day.   Past Medical History  Diagnosis Date  . Seasonal allergies   . HTN (hypertension)   . Osteoarthritis   . Palpitations   . Anemia   . History of colonic polyps   . GERD (gastroesophageal reflux disease)   . Hyperlipidemia   . Chronic facial pain   . Vitamin B12 deficiency   . Dyslipidemia   . Chronic foot pain   . Chronic cough   . Obesity   . Anxiety   . Complication of anesthesia     Sore jaw; could not chew or move mouth  . Diabetes mellitus     type 2 niddm x 20 years  . Metatarsal bone fracture 2014  . Hammer toe     bilateral  . Hx of radiation therapy 03/07/13- 03/29/13    right breast 4250 cGy 17 sessions  . Ejection fraction   . Skin cancer   . Melanoma   . Cancer of right breast 12/26/12    right breast 12:00 o'clock, DCIS  . Convulsions/seizures 10/16/2014    Past Surgical History  Procedure Laterality Date  . Colonoscopy    . Polypectomy      small adenomatous  . Abdominal hysterectomy    . Cholecystectomy    . Hernia repair    . Knee arthroscopy Bilateral   . Ulnar tunnel release    .  Eye surgery    . Cataract extraction w/ intraocular lens implant Right   . Bunionectomy    . Breast lumpectomy with needle localization Right 01/17/2013    Procedure: BREAST LUMPECTOMY WITH NEEDLE LOCALIZATION;  Surgeon: Edward Jolly, MD;  Location: [facility];  Service: General;  Laterality: Right;  . Breast biopsy Right 01/24/2013    Procedure: RE-EXCICION OF BREAST CANCER, ANTERIOR MARGINS;  Surgeon: Edward Jolly, MD;  Location: [facility];  Service: General;  Laterality: Right;  . Craniotomy Right 10/18/2013    Procedure: CRANIOTOMY TUMOR EXCISION;  Surgeon: Floyce Stakes, MD;  Location: [facility];  Service: Neurosurgery;  Laterality: Right;    History   Social History  . Marital Status: Widowed    Spouse Name: N/A  . Number of Children: 0  . Years of Education: college   Occupational History  . retired    Social History Main Topics  . Smoking status: Never Smoker   . Smokeless tobacco: Not on file  . Alcohol Use: No  . Drug Use: No  . Sexual Activity: Yes     Comment: menarche age  13, hysterectomy age 42, HRT x 2-3 mos, G1- miscarriage   Other Topics Concern  . Not on file   Social History Narrative   Widowed, lives alone.  Ambulates independently.    Drinks decaf only   Patient is right handed.    Current Outpatient Prescriptions on File Prior to Visit  Medication Sig Dispense Refill  . acetaminophen (TYLENOL) 500 MG tablet Take 1,000 mg by mouth every 6 (six) hours as needed for pain.    Marland Kitchen ALPRAZolam (XANAX) 0.25 MG tablet Take 0.25 mg by mouth at bedtime.     . calcium carbonate (OS-CAL) 600 MG TABS Take 600 mg by mouth daily.     . colestipol (COLESTID) 1 G tablet Take 2 g by mouth 2 (two) times daily.    . cyanocobalamin (,VITAMIN B-12,) 1000 MCG/ML injection Inject 1,000 mcg into the muscle every 21 ( twenty-one) days.    . fexofenadine (ALLEGRA) 60 MG tablet Take 60 mg by mouth every other day.     . furosemide (LASIX) 20 MG tablet Take 1 tablet (20  mg total) by mouth daily. Do not take until I call you about labs. (Patient taking differently: Take 20 mg by mouth daily as needed for fluid or edema. Do not take until I call you about labs.) 30 tablet 0  . lamoTRIgine (LAMICTAL) 25 MG tablet 1 tablet twice a day for 2 weeks, then take 2 tablets twice a day for 2 weeks, then take 3 tablets twice a day 180 tablet 1  . lidocaine (LIDODERM) 5 % Place 1 patch onto the skin daily. Remove & Discard patch within 12 hours or as directed by MD (Patient taking differently: Place 1 patch onto the skin daily as needed (for pain). Remove & Discard patch within 12 hours or as directed by MD) 90 patch 1  . metFORMIN (GLUCOPHAGE-XR) 500 MG 24 hr tablet 4 tabs daily (Patient taking differently: Take 500 mg by mouth 4 (four) times daily -  with meals and at bedtime. ) 360 tablet 3  . nateglinide (STARLIX) 120 MG tablet Take 1 tablet (120 mg total) by mouth 3 (three) times daily with meals. 270 tablet 3  . Polyethyl Glycol-Propyl Glycol 0.4-0.3 % SOLN Place 2 drops into both eyes 2 (two) times daily as needed (for pain and dry eyes).    . ranitidine (ZANTAC) 150 MG capsule Take 150 mg by mouth daily as needed for heartburn.     Marland Kitchen TARKA 2-180 MG per tablet TAKE 1 TABLET DAILY 90 tablet 1   Current Facility-Administered Medications on File Prior to Visit  Medication Dose Route Frequency Provider Last Rate Last Dose  . cyanocobalamin ((VITAMIN B-12)) injection 1,000 mcg  1,000 mcg Intramuscular Q30 days Ricard Dillon, MD   1,000 mcg at 02/13/11 7371    Allergies  Allergen Reactions  . Bystolic [Nebivolol Hcl] Other (See Comments)    "extreme weakness, heaviness in legs & arms, swelling in legs/arms/face, swollen abdomen, pain in bladder, feet pain, soreness in chest"  . Cholestyramine     "itching rash on stomach, bloated, nausea, vomiting, sleeplessness, extreme pain in arms"  . Hydrazine Yellow [Tartrazine] Other (See Comments)    "does not reduce high blood  pressure, pain in arm, high pressure, felt like I was on verge of heart attack, really weak"  . Morphine Other (See Comments)    Feels morbid, weak, still in pain  . Niacin Palpitations    Fast heart beat  . Niaspan [  Niacin Er] Palpitations and Other (See Comments)    "fast heart beat, high blood pressure"  . Norvasc [Amlodipine Besylate] Other (See Comments)    "extreme fluid retention/pain)  . Optivar [Azelastine Hcl] Photosensitivity  . Sular [Nisoldipine Er] Other (See Comments)    "severe headaches, swelling eyes, hands, feet, shortness of breath, weak, flushed face, brain boiling, fluid retention, high blood sugar, nervous, heart fast beating"  . Tegretol [Carbamazepine] Other (See Comments)    Blood poisoning   . Telmisartan Other (See Comments)    "headache, difficulty urinating, high blood sugar, fluid retention"  . Cefdinir Swelling    Vaginal irritation, breathing,   . Clonidine Other (See Comments)    Dry mouth, fluid retention  . Clonidine Hydrochloride Other (See Comments)    Dry mouth, fluid retention  . Codeine Nausea And Vomiting  . Ezetimibe Other (See Comments)    Made weak  . Naproxen Other (See Comments)    Shrinks bladder  . Ziac [Bisoprolol-Hydrochlorothiazide] Other (See Comments)    "stopped urination"  . Elavil [Amitriptyline] Other (See Comments)    Gave Pt nightmares  . Keppra [Levetiracetam]     Shaking  . Lyrica [Pregabalin] Swelling  . Ace Inhibitors Other (See Comments)    unknown  . Actonel [Risedronate Sodium] Other (See Comments)    unknown  . Amlodipine Besylate Other (See Comments)    unknown  . Aspirin Other (See Comments)    unknown  . Atacand [Candesartan] Other (See Comments)    unknown  . Bextra [Valdecoxib] Other (See Comments)    unknown  . Bisoprolol-Hydrochlorothiazide Other (See Comments)    unknown  . Candesartan Cilexetil Other (See Comments)    unknown  . Cefadroxil Other (See Comments)    unknown  . Celecoxib Rash   . Hydrocodone Other (See Comments)    unknown  . Hydrocodone-Acetaminophen Other (See Comments)    unknown  . Iodinated Diagnostic Agents Rash    "All over"  . Meloxicam Other (See Comments)    unknown  . Methylprednisolone Sodium Succinate Other (See Comments)    unknown  . Nabumetone Other (See Comments)    unknown  . Penicillins Other (See Comments)    unknown  . Pseudoephedrine-Guaifenesin Other (See Comments)    unknown  . Risedronate Sodium Other (See Comments)    unknown  . Rofecoxib Other (See Comments)    unknown  . Ru-Tuss [Chlorphen-Pse-Atrop-Hyos-Scop] Other (See Comments)    unknown  . Sulfonamide Derivatives Other (See Comments)    unknown  . Sulphur [Sulfur] Other (See Comments)    unknown  . Telithromycin Other (See Comments)    unknown  . Terfenadine Other (See Comments)    unknown  . Trandolapril-Verapamil Hcl Er Other (See Comments)    Headache, difficulty urinating, high blood sugar, fluid retention  . Valium [Diazepam] Other (See Comments)    Makes her mean and hyper    Family History  Problem Relation Age of Onset  . Colon cancer    . Heart disease Mother   . Diabetes Father   . Pancreatic cancer Father   . Bone cancer Sister   . Prostate cancer Brother   . Colon cancer Brother   . Rectal cancer Sister   . Thyroid disease Sister     benign goiter resected    BP 132/70 mmHg  Pulse 73  Temp(Src) 97.9 F (36.6 C) (Oral)  Ht 5\' 7"  (1.702 m)  Wt 135 lb (61.236 kg)  BMI 21.14 kg/m2  SpO2 98%  Review of Systems She denies hypoglycemia and weight change    Objective:   Physical Exam VITAL SIGNS:  See vs page GENERAL: no distress Pulses: dorsalis pedis intact bilat.   MSK: no deformity of the feet, except for hammer toes CV: 1+ bilat leg edema.   Skin:  no ulcer on the feet.  normal color and temp on the feet.   Neuro: sensation is intact to touch on the feet.     Lab Results  Component Value Date   HGBA1C 6.7* 10/29/2014        Assessment & Plan:  DM: well-controlled.   Patient is advised the following: Patient Instructions  check your blood sugar once a day.  vary the time of day when you check, between before the 3 meals, and at bedtime.  also check if you have symptoms of your blood sugar being too high or too low.  please keep a record of the readings and bring it to your next appointment here.  You can write it on any piece of paper.  please call us sooner if your blood sugar goes below 70, or if you have a lot of readings over 200.    blood tests are being requested for you today.  We'll let you know about the results.  If it is high, we can change the nateglinide to "repaglinide," or add "invokana."  Please come back for a follow-up appointment in 4 months.    addendum: Please continue the same medications

## 2014-10-29 NOTE — Patient Instructions (Addendum)
check your blood sugar once a day.  vary the time of day when you check, between before the 3 meals, and at bedtime.  also check if you have symptoms of your blood sugar being too high or too low.  please keep a record of the readings and bring it to your next appointment here.  You can write it on any piece of paper.  please call us sooner if your blood sugar goes below 70, or if you have a lot of readings over 200.    blood tests are being requested for you today.  We'll let you know about the results.  If it is high, we can change the nateglinide to "repaglinide," or add "invokana."  Please come back for a follow-up appointment in 4 months.

## 2014-10-30 ENCOUNTER — Encounter: Payer: Self-pay | Admitting: Podiatry

## 2014-10-30 ENCOUNTER — Ambulatory Visit (INDEPENDENT_AMBULATORY_CARE_PROVIDER_SITE_OTHER): Payer: Medicare Other | Admitting: Podiatry

## 2014-10-30 ENCOUNTER — Telehealth: Payer: Self-pay | Admitting: Neurology

## 2014-10-30 VITALS — BP 167/100 | HR 84 | Resp 18

## 2014-10-30 DIAGNOSIS — G629 Polyneuropathy, unspecified: Secondary | ICD-10-CM | POA: Diagnosis not present

## 2014-10-30 DIAGNOSIS — L84 Corns and callosities: Secondary | ICD-10-CM | POA: Diagnosis not present

## 2014-10-30 DIAGNOSIS — E1142 Type 2 diabetes mellitus with diabetic polyneuropathy: Secondary | ICD-10-CM | POA: Diagnosis not present

## 2014-10-30 MED ORDER — TOPIRAMATE 25 MG PO TABS: ORAL_TABLET | ORAL | Status: DC

## 2014-10-30 NOTE — Telephone Encounter (Signed)
I called the patient. The patient indicates that she is having side effects from Lamictal. She is already tried Vimpat, Keppra, carbamazepine with side effects. The patient is allergic to sulfa drugs. We will try low-dose Topamax to see if she can tolerate this.

## 2014-10-30 NOTE — Progress Notes (Signed)
   Subjective:    Patient ID: [name], female    DOB: January 04, 1930, 81 y.o.   MRN: [id number]  HPI  THE PLACE ON THE BOTTOM OF MY LEFT FOOT IS CRUSTY AND IT WAS LOOSE AND I CUT ON IT AND I THINK I CUT TOO DEEP AND IS SORE AND HURTS AND I USED A BANDAID AND POLYSPORIN  Patient presents for ongoing debridement of pre-ulcerative plantar callus in the left foot.  Review of Systems     Objective:   Physical Exam Patient wearing diabetic shoes with insoles  Bleeding callus sub-first MPJ left without any surrounding erythema, edema, malodor Plantar callus sub-first MPJ right without bleeding within the callus       Assessment & Plan:   Assessment: Pre-ulcerative plantar keratoses left Plantar callus right Diabetic with a history of peripheral neuropathy  Plan: Debridement of pre-ulcerative callus and keratoses without any bleeding  Maintain diabetic shoes with insoles  Reappoint 4 weeks

## 2014-10-30 NOTE — Telephone Encounter (Signed)
I called the patient. She stated that she has been having terrible side effects from the Lamictal. She has been experiencing all of the ones listed below and she has been biting her tongue when she talks. She described it as feeling like her tongue is getting in the way of talking. She has also had a lot of itching right after she takes the Lamictal. The past few days she has only taken 1 tablet and has not taken any today. Can she be switched to another medication?

## 2014-10-30 NOTE — Telephone Encounter (Signed)
Patient is calling in regard Rx lamictal 25 mg 2 X day.  Patient states she is having side effects are irritable, drowsy, excessive talking, feels like she is going to fall, swollen ankles. Please call.

## 2014-10-30 NOTE — Patient Instructions (Signed)
Diabetes and Foot Care Diabetes Tippin cause you to have problems because of poor blood supply (circulation) to your feet and legs. This Askin cause the skin on your feet to become thinner, break easier, and heal more slowly. Your skin Kho become dry, and the skin Florek peel and crack. You Chuong also have nerve damage in your legs and feet causing decreased feeling in them. You Helzer not notice minor injuries to your feet that could lead to infections or more serious problems. Taking care of your feet is one of the most important things you can do for yourself.  HOME CARE INSTRUCTIONS  Wear shoes at all times, even in the house. Do not go barefoot. Bare feet are easily injured.  Check your feet daily for blisters, cuts, and redness. If you cannot see the bottom of your feet, use a mirror or ask someone for help.  Wash your feet with warm water (do not use hot water) and mild soap. Then pat your feet and the areas between your toes until they are completely dry. Do not soak your feet as this can dry your skin.  Apply a moisturizing lotion or petroleum jelly (that does not contain alcohol and is unscented) to the skin on your feet and to dry, brittle toenails. Do not apply lotion between your toes.  Trim your toenails straight across. Do not dig under them or around the cuticle. File the edges of your nails with an emery board or nail file.  Do not cut corns or calluses or try to remove them with medicine.  Wear clean socks or stockings every day. Make sure they are not too tight. Do not wear knee-high stockings since they Row decrease blood flow to your legs.  Wear shoes that fit properly and have enough cushioning. To break in new shoes, wear them for just a few hours a day. This prevents you from injuring your feet. Always look in your shoes before you put them on to be sure there are no objects inside.  Do not cross your legs. This Jeppsen decrease the blood flow to your feet.  If you find a minor scrape,  cut, or break in the skin on your feet, keep it and the skin around it clean and dry. These areas Roell be cleansed with mild soap and water. Do not cleanse the area with peroxide, alcohol, or iodine.  When you remove an adhesive bandage, be sure not to damage the skin around it.  If you have a wound, look at it several times a day to make sure it is healing.  Do not use heating pads or hot water bottles. They Bialas burn your skin. If you have lost feeling in your feet or legs, you Nethery not know it is happening until it is too late.  Make sure your health care provider performs a complete foot exam at least annually or more often if you have foot problems. Report any cuts, sores, or bruises to your health care provider immediately. SEEK MEDICAL CARE IF:   You have an injury that is not healing.  You have cuts or breaks in the skin.  You have an ingrown nail.  You notice redness on your legs or feet.  You feel burning or tingling in your legs or feet.  You have pain or cramps in your legs and feet.  Your legs or feet are numb.  Your feet always feel cold. SEEK IMMEDIATE MEDICAL CARE IF:   There is increasing redness,   swelling, or pain in or around a wound.  There is a red line that goes up your leg.  Pus is coming from a wound.  You develop a fever or as directed by your health care provider.  You notice a bad smell coming from an ulcer or wound. Document Released: 04/17/2000 Document Revised: 12/21/2012 Document Reviewed: 09/27/2012 Exit[employer] Patient Information 2015 [employer]. This information is not intended to replace advice given to you by your health care provider. Make sure you discuss any questions you have with your health care provider.  

## 2014-11-07 ENCOUNTER — Encounter (HOSPITAL_COMMUNITY): Payer: Self-pay | Admitting: Cardiology

## 2014-11-07 ENCOUNTER — Emergency Department (HOSPITAL_COMMUNITY)
Admission: EM | Admit: 2014-11-07 | Discharge: 2014-11-07 | Disposition: A | Discharge status: Home / Self Care | Payer: Medicare Other | Attending: Emergency Medicine | Admitting: Emergency Medicine

## 2014-11-07 ENCOUNTER — Telehealth: Payer: Self-pay | Admitting: Neurology

## 2014-11-07 ENCOUNTER — Emergency Department (HOSPITAL_COMMUNITY): Payer: Medicare Other

## 2014-11-07 DIAGNOSIS — K219 Gastro-esophageal reflux disease without esophagitis: Secondary | ICD-10-CM | POA: Diagnosis not present

## 2014-11-07 DIAGNOSIS — S00532A Contusion of oral cavity, initial encounter: Secondary | ICD-10-CM | POA: Insufficient documentation

## 2014-11-07 DIAGNOSIS — X58XXXA Exposure to other specified factors, initial encounter: Secondary | ICD-10-CM | POA: Diagnosis not present

## 2014-11-07 DIAGNOSIS — Y929 Unspecified place or not applicable: Secondary | ICD-10-CM | POA: Diagnosis not present

## 2014-11-07 DIAGNOSIS — E119 Type 2 diabetes mellitus without complications: Secondary | ICD-10-CM | POA: Diagnosis not present

## 2014-11-07 DIAGNOSIS — I10 Essential (primary) hypertension: Secondary | ICD-10-CM | POA: Insufficient documentation

## 2014-11-07 DIAGNOSIS — F419 Anxiety disorder, unspecified: Secondary | ICD-10-CM | POA: Diagnosis not present

## 2014-11-07 DIAGNOSIS — Z85828 Personal history of other malignant neoplasm of skin: Secondary | ICD-10-CM | POA: Diagnosis not present

## 2014-11-07 DIAGNOSIS — M1990 Unspecified osteoarthritis, unspecified site: Secondary | ICD-10-CM | POA: Insufficient documentation

## 2014-11-07 DIAGNOSIS — Y939 Activity, unspecified: Secondary | ICD-10-CM | POA: Insufficient documentation

## 2014-11-07 DIAGNOSIS — Z8709 Personal history of other diseases of the respiratory system: Secondary | ICD-10-CM | POA: Diagnosis not present

## 2014-11-07 DIAGNOSIS — G8929 Other chronic pain: Secondary | ICD-10-CM | POA: Insufficient documentation

## 2014-11-07 DIAGNOSIS — Z8781 Personal history of (healed) traumatic fracture: Secondary | ICD-10-CM | POA: Insufficient documentation

## 2014-11-07 DIAGNOSIS — E785 Hyperlipidemia, unspecified: Secondary | ICD-10-CM | POA: Diagnosis not present

## 2014-11-07 DIAGNOSIS — Z86010 Personal history of colonic polyps: Secondary | ICD-10-CM | POA: Diagnosis not present

## 2014-11-07 DIAGNOSIS — Z79899 Other long term (current) drug therapy: Secondary | ICD-10-CM | POA: Insufficient documentation

## 2014-11-07 DIAGNOSIS — Z853 Personal history of malignant neoplasm of breast: Secondary | ICD-10-CM | POA: Diagnosis not present

## 2014-11-07 DIAGNOSIS — E669 Obesity, unspecified: Secondary | ICD-10-CM | POA: Insufficient documentation

## 2014-11-07 DIAGNOSIS — G40909 Epilepsy, unspecified, not intractable, without status epilepticus: Secondary | ICD-10-CM | POA: Insufficient documentation

## 2014-11-07 DIAGNOSIS — R569 Unspecified convulsions: Secondary | ICD-10-CM

## 2014-11-07 DIAGNOSIS — D649 Anemia, unspecified: Secondary | ICD-10-CM | POA: Insufficient documentation

## 2014-11-07 DIAGNOSIS — Z880 Allergy status to penicillin: Secondary | ICD-10-CM | POA: Insufficient documentation

## 2014-11-07 DIAGNOSIS — Y999 Unspecified external cause status: Secondary | ICD-10-CM | POA: Insufficient documentation

## 2014-11-07 DIAGNOSIS — G9389 Other specified disorders of brain: Secondary | ICD-10-CM | POA: Diagnosis not present

## 2014-11-07 LAB — BASIC METABOLIC PANEL
Anion gap: 9 (ref 5–15)
BUN: 12 mg/dL (ref 6–20)
CO2: 24 mmol/L (ref 22–32)
Calcium: 9.2 mg/dL (ref 8.9–10.3)
Chloride: 104 mmol/L (ref 101–111)
Creatinine, Ser: 0.75 mg/dL (ref 0.44–1.00)
GFR calc Af Amer: >60 mL/min (ref >60)
GFR calc non Af Amer: >60 mL/min (ref >60)
Glucose, Bld: 121 mg/dL — ABNORMAL HIGH (ref 65–99)
Potassium: 4 mmol/L (ref 3.5–5.1)
Sodium: 137 mmol/L (ref 135–145)

## 2014-11-07 LAB — URINALYSIS, ROUTINE W REFLEX MICROSCOPIC
Bilirubin Urine: NEGATIVE
Glucose, UA: NEGATIVE mg/dL
Hgb urine dipstick: NEGATIVE
Ketones, ur: NEGATIVE mg/dL
Leukocytes, UA: NEGATIVE
Nitrite: NEGATIVE
Protein, ur: NEGATIVE mg/dL
Specific Gravity, Urine: 1.008 (ref 1.005–1.030)
Urobilinogen, UA: 0.2 mg/dL (ref 0.0–1.0)
pH: 6.5 (ref 5.0–8.0)

## 2014-11-07 LAB — CBC WITH DIFFERENTIAL/PLATELET
Basophils Absolute: 0 10*3/uL (ref 0.0–0.1)
Basophils Relative: 0 % (ref 0–1)
Eosinophils Absolute: 0 10*3/uL (ref 0.0–0.7)
Eosinophils Relative: 0 % (ref 0–5)
HCT: 35.4 % — ABNORMAL LOW (ref 36.0–46.0)
Hemoglobin: 11.6 g/dL — ABNORMAL LOW (ref 12.0–15.0)
Lymphocytes Relative: 21 % (ref 12–46)
Lymphs Abs: 1.7 10*3/uL (ref 0.7–4.0)
MCH: 28.6 pg (ref 26.0–34.0)
MCHC: 32.8 g/dL (ref 30.0–36.0)
MCV: 87.2 fL (ref 78.0–100.0)
Monocytes Absolute: 0.5 10*3/uL (ref 0.1–1.0)
Monocytes Relative: 7 % (ref 3–12)
Neutro Abs: 5.6 10*3/uL (ref 1.7–7.7)
Neutrophils Relative %: 72 % (ref 43–77)
Platelets: 255 10*3/uL (ref 150–400)
RBC: 4.06 MIL/uL (ref 3.87–5.11)
RDW: 13.7 % (ref 11.5–15.5)
WBC: 7.8 10*3/uL (ref 4.0–10.5)

## 2014-11-07 MED ORDER — ACETAMINOPHEN 500 MG PO TABS
1000.0000 mg | ORAL_TABLET | Freq: Once | ORAL | Status: AC
  Administered 2014-11-07: 1000 mg via ORAL
  Filled 2014-11-07: qty 2

## 2014-11-07 MED ORDER — GI COCKTAIL ~~LOC~~
30.0000 mL | Freq: Once | ORAL | Status: AC
  Administered 2014-11-07: 30 mL via ORAL
  Filled 2014-11-07: qty 30

## 2014-11-07 NOTE — Telephone Encounter (Signed)
Events noted. The patient is currently in the ER for an evaluation. The patient will need to start the Topamax soon. She has been unable to tolerate a multitude of other medications.

## 2014-11-07 NOTE — ED Notes (Signed)
Pt to department reporting that she thinks she had seizure last night. Reports she bit her tongue and feels slightly confused. Reports she had a brain tumor removed last year.

## 2014-11-07 NOTE — Telephone Encounter (Signed)
[name] called on behalf of the patient and requested to speak with the nurse or Dr. Jannifer Franklin. The patient woke up this morning and had apparently bitten her tongue at some point throughout the night but does not remember, she is also experiencing some confusion. They would like to know if she should come in and be seen by Dr. Jannifer Franklin. Please call and advise.

## 2014-11-07 NOTE — Telephone Encounter (Signed)
[name] called back and wanted to let [facility] RN know that they are taking the patient to the ED. No need to return call.

## 2014-11-07 NOTE — Discharge Instructions (Signed)
Follow-up with your neurologist.  Return here as needed

## 2014-11-07 NOTE — Telephone Encounter (Signed)
I called Manuela Schwartz (care companion). She stated that the patient's tongue is blue and swollen. She also stated that the patient is confused and has been since she woke up. She could not remember her niece stayed the night with her last night. She also c/o a headache that came on shortly after waking up. Manuela Schwartz stated the patient woke up around 7 am. She also stated the patient has not started the Topamax Dr. Jannifer Franklin recently prescribed. She is waiting for it to arrive in the mail, she said. I consulted with Dr. Felecia Shelling, who recommended the patient go to the ED. He stated the confusion should have worn off within about an hour of the seizure. I called Manuela Schwartz back and explained this. She stated they would go to the ED.

## 2014-11-07 NOTE — ED Provider Notes (Signed)
CSN: [id number]     Arrival date & time 11/07/14  1210 History   First MD Initiated Contact with Patient 11/07/14 1220     Chief Complaint  Patient presents with  . Seizures     (Consider location/radiation/quality/duration/timing/severity/associated sxs/prior Treatment) HPI Patient presents to the emergency department with seizure that occurred while sleeping.  The patient states she woke up with bruising and swelling to the tongue.  The patient, states she has had this issue once or twice before due to her tumor removed in the right frontal lobe.  Patient states that she was supposed start taking a new seizure medicine, but had not started taking it yet.  She states that she was due to start taking it today.  The patient states that she does not chest pain, shortness of breath, headache, blurred vision, weakness, dizziness, back pain, neck pain, fever, dysuria, incontinence, abdominal pain, or syncope.  The patient states that she did have some confusion when she woke up this morning from sleep Past Medical History  Diagnosis Date  . Seasonal allergies   . HTN (hypertension)   . Osteoarthritis   . Palpitations   . Anemia   . History of colonic polyps   . GERD (gastroesophageal reflux disease)   . Hyperlipidemia   . Chronic facial pain   . Vitamin B12 deficiency   . Dyslipidemia   . Chronic foot pain   . Chronic cough   . Obesity   . Anxiety   . Complication of anesthesia     Sore jaw; could not chew or move mouth  . Diabetes mellitus     type 2 niddm x 20 years  . Metatarsal bone fracture 2014  . Hammer toe     bilateral  . Hx of radiation therapy 03/07/13- 03/29/13    right breast 4250 cGy 17 sessions  . Ejection fraction   . Skin cancer   . Melanoma   . Cancer of right breast 12/26/12    right breast 12:00 o'clock, DCIS  . Convulsions/seizures 10/16/2014   Past Surgical History  Procedure Laterality Date  . Colonoscopy    . Polypectomy      small adenomatous  .  Abdominal hysterectomy    . Cholecystectomy    . Hernia repair    . Knee arthroscopy Bilateral   . Ulnar tunnel release    . Eye surgery    . Cataract extraction w/ intraocular lens implant Right   . Bunionectomy    . Breast lumpectomy with needle localization Right 01/17/2013    Procedure: BREAST LUMPECTOMY WITH NEEDLE LOCALIZATION;  Surgeon: Edward Jolly, MD;  Location: [facility];  Service: General;  Laterality: Right;  . Breast biopsy Right 01/24/2013    Procedure: RE-EXCICION OF BREAST CANCER, ANTERIOR MARGINS;  Surgeon: Edward Jolly, MD;  Location: [facility];  Service: General;  Laterality: Right;  . Craniotomy Right 10/18/2013    Procedure: CRANIOTOMY TUMOR EXCISION;  Surgeon: Floyce Stakes, MD;  Location: [facility];  Service: Neurosurgery;  Laterality: Right;   Family History  Problem Relation Age of Onset  . Colon cancer    . Heart disease Mother   . Diabetes Father   . Pancreatic cancer Father   . Bone cancer Sister   . Prostate cancer Brother   . Colon cancer Brother   . Rectal cancer Sister   . Thyroid disease Sister     benign goiter resected   History  Substance Use Topics  .  Smoking status: Never Smoker   . Smokeless tobacco: Not on file  . Alcohol Use: No   OB History    No data available     Review of Systems  All other systems negative except as documented in the HPI. All pertinent positives and negatives as reviewed in the HPI.ray   Allergies  Bystolic; Cholestyramine; Hydrazine yellow; Morphine; Niacin; Niaspan; Norvasc; Opt[place]ar[place] Gum; Tegretol; Telmisartan; Cefdinir; Clonidine; Clonidine hydrochloride; Codeine; Ezetimibe; Naproxen; Ziac; Elavil; Keppra; Lamictal; Lyrica; Ace inhibitors; Actonel; Amlodipine besylate; Aspirin; Atacand; Bextra; Bisoprolol-hydrochlorothiazide; Candesartan cilexetil; Cefadroxil; Celecoxib; Hydrocodone; Hydrocodone-acetaminophen; Iodinated diagnostic agents; Meloxicam; Methylprednisolone sodium succinate;  Nabumetone; Penicillins; Pseudoephedrine-guaifenesin; Risedronate sodium; Rofecoxib; Ru-tuss; Sulfonamide derivatives; [place]; Telithromycin; Terfenadine; Trandolapril-verapamil hcl er; and Valium  Home Medications   Prior to Admission medications   Medication Sig Start Date End Date Taking? Authorizing Provider  acetaminophen (TYLENOL) 500 MG tablet Take 1,000 mg by mouth every 6 (six) hours as needed for pain.    Historical Provider, MD  ALPRAZolam Duanne Moron) 0.25 MG tablet Take 0.25 mg by mouth at bedtime.  02/13/11   Ricard Dillon, MD  calcium carbonate (OS-CAL) 600 MG TABS Take 600 mg by mouth daily.     Historical Provider, MD  colestipol (COLESTID) 1 G tablet Take 2 g by mouth 2 (two) times daily.    Historical Provider, MD  cyanocobalamin (,VITAMIN B-12,) 1000 MCG/ML injection Inject 1,000 mcg into the muscle every 21 ( twenty-one) days.    Historical Provider, MD  fexofenadine (ALLEGRA) 60 MG tablet Take 60 mg by mouth every other day.     Historical Provider, MD  furosemide (LASIX) 20 MG tablet Take 1 tablet (20 mg total) by mouth daily. Do not take until I call you about labs. Patient taking differently: Take 20 mg by mouth daily as needed for fluid or edema. Do not take until I call you about labs. 12/27/13   Marin Olp, MD  lidocaine (LIDODERM) 5 % Place 1 patch onto the skin daily. Remove & Discard patch within 12 hours or as directed by MD Patient taking differently: Place 1 patch onto the skin daily as needed (for pain). Remove & Discard patch within 12 hours or as directed by MD 11/17/13   Ricard Dillon, MD  metFORMIN (GLUCOPHAGE-XR) 500 MG 24 hr tablet 4 tabs daily Patient taking differently: Take 500 mg by mouth 4 (four) times daily -  with meals and at bedtime.  04/09/14   Renato Shin, MD  nateglinide (STARLIX) 120 MG tablet Take 1 tablet (120 mg total) by mouth 3 (three) times daily with meals. 07/17/14   Renato Shin, MD  Polyethyl Glycol-Propyl Glycol 0.4-0.3 % SOLN Place  2 drops into both eyes 2 (two) times daily as needed (for pain and dry eyes).    Historical Provider, MD  ranitidine (ZANTAC) 150 MG capsule Take 150 mg by mouth daily as needed for heartburn.     Historical Provider, MD  Preston Fleeting 2-180 MG per tablet TAKE 1 TABLET DAILY 02/06/14   Carlena Bjornstad, MD  topiramate (TOPAMAX) 25 MG tablet 1 tablet at night for one week, then take 1 tablet twice daily for one week, then take 1 tablet in the morning and 2 in the evening 10/30/14   Kathrynn Ducking, MD   BP 174/89 mmHg  Pulse 74  Temp(Src) 98.6 F (37 C) (Oral)  Resp 16  Wt 135 lb (61.236 kg)  SpO2 99% Physical Exam  Constitutional: She is oriented to person, place, and  time. She appears well-developed and well-nourished. No distress.  HENT:  Mouth/Throat: Uvula is midline, oropharynx is clear and moist and mucous membranes are normal.    Eyes: Pupils are equal, round, and reactive to light.  Neck: Normal range of motion. Neck supple.  Cardiovascular: Normal rate, regular rhythm and normal heart sounds.  Exam reveals no gallop and no friction rub.   No murmur heard. Pulmonary/Chest: Effort normal and breath sounds normal. No respiratory distress.  Musculoskeletal: She exhibits no edema.  Neurological: She is alert and oriented to person, place, and time. She exhibits normal muscle tone. Coordination normal.  Skin: Skin is warm and dry. No rash noted. No erythema.  Psychiatric: She has a normal mood and affect. Her behavior is normal.  Nursing note and vitals reviewed.   ED Course  Procedures (including critical care time) Labs Review Labs Reviewed  BASIC METABOLIC PANEL - Abnormal; Notable for the following:    Glucose, Bld 121 (*)    All other components within normal limits  CBC WITH DIFFERENTIAL/PLATELET - Abnormal; Notable for the following:    Hemoglobin 11.6 (*)    HCT 35.4 (*)    All other components within normal limits  URINALYSIS, ROUTINE W REFLEX MICROSCOPIC (NOT AT [facility])     Imaging Review Ct Head Wo Contrast  11/07/2014   CLINICAL DATA:  Previous meningioma resection. Questionable seizure last night.  EXAM: CT HEAD WITHOUT CONTRAST  TECHNIQUE: Contiguous axial images were obtained from the base of the skull through the vertex without intravenous contrast.  COMPARISON:  Head CT March 13, 2014; brain MRI August 29, 2014  FINDINGS: The ventricles are normal in size and configuration. There is mild parietal lobe atrophy bilaterally, stable. There is encephalomalacia in the right frontal lobe region in the area of previous tumor removal. Currently there is no demonstrable mass, hemorrhage, or midline shift. There remains a small amount of extra-axial fluid in the area of previous tumor resection in the right frontal lobe region, stable. A small membrane in this area is stable. There is no new extra-axial fluid. There is small vessel disease in the anterior centra semiovale bilaterally as well as in the left internal capsule, stable. No acute infarct is appreciable on this study. There is evidence of previous right frontal craniotomy. No new bone lesions are identified. Mastoid air cells bilaterally are clear.  IMPRESSION: Stable right frontal lobe encephalomalacia. Small amount of extra-axial fluid in the right frontal region, stable. No new extra-axial fluid. No hemorrhage or mass effect. Small vessel disease is stable, most notably in the anterior centra semiovale bilaterally and in the anterior limb of the left internal capsule. No acute infarct apparent.   Electronically Signed   By: Lowella Grip III M.D.   On: 11/07/2014 14:03   Assessment stable here in the emergency department.  We will have her follow-up with her neurologist.  Told to return here as needed.  Patient agrees the plan and all questions were answered   Dalia Heading, PA-C 11/09/14 1610  Pattricia Boss, MD 11/09/14 [id number]

## 2014-11-12 ENCOUNTER — Telehealth: Payer: Self-pay | Admitting: Neurology

## 2014-11-12 MED ORDER — PREGABALIN 50 MG PO CAPS: 50.0000 mg | ORAL_CAPSULE | Freq: Two times a day (BID) | ORAL | Status: DC

## 2014-11-12 NOTE — Telephone Encounter (Signed)
I called the patient. She cannot take the topamax. I will try Lyrica. She had another seizure last week.

## 2014-11-12 NOTE — Telephone Encounter (Signed)
Patient is calling in regard to Rx topiramate and states that she also takes metformin and states that she read that these drugs should not be taken together.  Also the topiramate seems to be making her eyes dry. Please call patient.

## 2014-11-12 NOTE — Telephone Encounter (Signed)
[name] is returning your call.

## 2014-11-12 NOTE — Telephone Encounter (Signed)
Museum/gallery conservator, from [employer], regarding June 14th letter. They would like to speak with the MD to gather more information about the pt. Please call [phone], ext [phone].

## 2014-11-12 NOTE — Telephone Encounter (Signed)
I spoke to [name]. She wanted to explain why [place] came to the conclusion that the patient would have 16 hours of care per week. She stated that she spoke to the patient and the caregiver who has been going into the home. Both stated that the patient did not need any help with bathing/dressing. She only needs help with laundry, housekeeping and transportation to and from doctor appointments. Unfortunately, the plan does not cover for pet care, so that cannot be included in the time. Amber wanted to make sure there wasn't anything else that was going on she didn't know about. I told her I did not think there was any other information. She asked that I let Dr. Jannifer Franklin know how they made their decision. I told her I would.

## 2014-11-12 NOTE — Telephone Encounter (Signed)
Sounds like the patient is receiving an appropriate amount of home care.

## 2014-11-12 NOTE — Telephone Encounter (Signed)
I called [name] and left a voicemail.

## 2014-11-14 ENCOUNTER — Ambulatory Visit: Payer: Medicare Other | Admitting: Diagnostic Neuroimaging

## 2014-11-15 ENCOUNTER — Telehealth: Payer: Self-pay | Admitting: Neurology

## 2014-11-15 DIAGNOSIS — R569 Unspecified convulsions: Secondary | ICD-10-CM

## 2014-11-15 DIAGNOSIS — R269 Unspecified abnormalities of gait and mobility: Secondary | ICD-10-CM

## 2014-11-15 MED ORDER — PHENYTOIN SODIUM EXTENDED 100 MG PO CAPS: ORAL_CAPSULE | ORAL | Status: DC

## 2014-11-15 NOTE — Telephone Encounter (Signed)
I called [name] and left a voicemail.

## 2014-11-15 NOTE — Telephone Encounter (Signed)
[name] an Medical illustrator for the patient is calling with the patient on the line as well who is authorizing this call. [name] states the patient does have [place] care but the plan of care needs to be discussed so insurance will pay. You can call [name] or if you had rather call the patient at [phone]. Thank you.

## 2014-11-15 NOTE — Telephone Encounter (Signed)
Patient called stating she can take the pregabalin (LYRICA) 50 MG capsule . The Lyrica has caused her gain 10 lbs in 4 days. She talked with Dr on call last night and told her to call back today to discuss something else to take. She also states when she had seizure last Tuesday night she bit her tongue and it is not healing well and would like recommendation for that issue as well. Please call and advise. Patient can be reached at [phone].

## 2014-11-15 NOTE — Telephone Encounter (Signed)
Ben returned call and requested to speak with [facility]. Please call and advise.

## 2014-11-15 NOTE — Telephone Encounter (Signed)
The patient indicates that she has taken Lyrica previously, this has resulted in significant weight gain. I will try low-dose Dilantin.

## 2014-11-15 NOTE — Telephone Encounter (Signed)
I called [name] and left a voicemail. I called the patient. She wanted to wait to discuss her insurance when [place] could be on the phone. We will wait for [facility] to call back and then he can connect Dorotha to the call.

## 2014-11-16 DIAGNOSIS — T1501XA Foreign body in cornea, right eye, initial encounter: Secondary | ICD-10-CM | POA: Diagnosis not present

## 2014-11-19 ENCOUNTER — Telehealth: Payer: Self-pay | Admitting: Neurology

## 2014-11-19 ENCOUNTER — Telehealth: Payer: Self-pay | Admitting: Family Medicine

## 2014-11-19 NOTE — Telephone Encounter (Signed)
I called the insurance agent. He believes that she needs more aid and assistance in the home environment. The patient has fallen, has difficulty getting up. She will need some help with bathing and dressing. I will call the patient.    I called the patient, left a message, I will call back later.

## 2014-11-19 NOTE — Telephone Encounter (Signed)
Error

## 2014-11-19 NOTE — Telephone Encounter (Signed)
I spoke to [place]. [name] wondered if Dr. Jannifer Franklin would be able to place an order for home health/assistance while bathing/dressing. He is trying to keep the same level of assistance in the home, rather than letting it be reduced to 16 hours per week. While on the phone, Angelena mentioned that she has fallen several times and could not get up for a couple of hours. She stated she has an emergency alert system, but when she falls, she cannot remember to use it. She thinks maybe she has had seizures that make her fall and that is why she cannot remember to use her button. They would like to know if we could call or send another letter stating this to the insurance company.

## 2014-11-19 NOTE — Telephone Encounter (Signed)
[facility] is the [facility] agency that the patient uses. I will send an order into increase the amount of aid that the patient is getting.  The phone number is [phone].

## 2014-11-23 DIAGNOSIS — Z800 Family history of malignant neoplasm of digestive organs: Secondary | ICD-10-CM | POA: Diagnosis not present

## 2014-11-23 DIAGNOSIS — Z86010 Personal history of colonic polyps: Secondary | ICD-10-CM | POA: Diagnosis not present

## 2014-11-26 DIAGNOSIS — H16042 Marginal corneal ulcer, left eye: Secondary | ICD-10-CM | POA: Diagnosis not present

## 2014-11-27 ENCOUNTER — Encounter: Payer: Self-pay | Admitting: Podiatry

## 2014-11-27 ENCOUNTER — Ambulatory Visit (INDEPENDENT_AMBULATORY_CARE_PROVIDER_SITE_OTHER): Payer: Medicare Other | Admitting: Podiatry

## 2014-11-27 VITALS — BP 142/75 | HR 63 | Resp 18

## 2014-11-27 DIAGNOSIS — G629 Polyneuropathy, unspecified: Secondary | ICD-10-CM

## 2014-11-27 DIAGNOSIS — L84 Corns and callosities: Secondary | ICD-10-CM | POA: Diagnosis not present

## 2014-11-27 DIAGNOSIS — E1142 Type 2 diabetes mellitus with diabetic polyneuropathy: Secondary | ICD-10-CM | POA: Diagnosis not present

## 2014-11-27 NOTE — Patient Instructions (Signed)
Diabetes and Foot Care Diabetes Bovenzi cause you to have problems because of poor blood supply (circulation) to your feet and legs. This Seith cause the skin on your feet to become thinner, break easier, and heal more slowly. Your skin Dougan become dry, and the skin Putzier peel and crack. You Kim also have nerve damage in your legs and feet causing decreased feeling in them. You Kliewer not notice minor injuries to your feet that could lead to infections or more serious problems. Taking care of your feet is one of the most important things you can do for yourself.  HOME CARE INSTRUCTIONS  Wear shoes at all times, even in the house. Do not go barefoot. Bare feet are easily injured.  Check your feet daily for blisters, cuts, and redness. If you cannot see the bottom of your feet, use a mirror or ask someone for help.  Wash your feet with warm water (do not use hot water) and mild soap. Then pat your feet and the areas between your toes until they are completely dry. Do not soak your feet as this can dry your skin.  Apply a moisturizing lotion or petroleum jelly (that does not contain alcohol and is unscented) to the skin on your feet and to dry, brittle toenails. Do not apply lotion between your toes.  Trim your toenails straight across. Do not dig under them or around the cuticle. File the edges of your nails with an emery board or nail file.  Do not cut corns or calluses or try to remove them with medicine.  Wear clean socks or stockings every day. Make sure they are not too tight. Do not wear knee-high stockings since they Boehne decrease blood flow to your legs.  Wear shoes that fit properly and have enough cushioning. To break in new shoes, wear them for just a few hours a day. This prevents you from injuring your feet. Always look in your shoes before you put them on to be sure there are no objects inside.  Do not cross your legs. This Ryans decrease the blood flow to your feet.  If you find a minor scrape,  cut, or break in the skin on your feet, keep it and the skin around it clean and dry. These areas Mcpeek be cleansed with mild soap and water. Do not cleanse the area with peroxide, alcohol, or iodine.  When you remove an adhesive bandage, be sure not to damage the skin around it.  If you have a wound, look at it several times a day to make sure it is healing.  Do not use heating pads or hot water bottles. They Rausch burn your skin. If you have lost feeling in your feet or legs, you Waguespack not know it is happening until it is too late.  Make sure your health care provider performs a complete foot exam at least annually or more often if you have foot problems. Report any cuts, sores, or bruises to your health care provider immediately. SEEK MEDICAL CARE IF:   You have an injury that is not healing.  You have cuts or breaks in the skin.  You have an ingrown nail.  You notice redness on your legs or feet.  You feel burning or tingling in your legs or feet.  You have pain or cramps in your legs and feet.  Your legs or feet are numb.  Your feet always feel cold. SEEK IMMEDIATE MEDICAL CARE IF:   There is increasing redness,   swelling, or pain in or around a wound.  There is a red line that goes up your leg.  Pus is coming from a wound.  You develop a fever or as directed by your health care provider.  You notice a bad smell coming from an ulcer or wound. Document Released: 04/17/2000 Document Revised: 12/21/2012 Document Reviewed: 09/27/2012 ExitCare Patient Information 2015 [employer]. This information is not intended to replace advice given to you by your health care provider. Make sure you discuss any questions you have with your health care provider.  

## 2014-11-27 NOTE — Progress Notes (Signed)
   Subjective:    Patient ID: [name], female    DOB: 25-Jan-1930, 81 y.o.   MRN: [id number]  HPI I HAVE SOME CALLUSES ON BOTH OF MY FEET THAT NEED TO BE TRIMMED UP AND I HAD EYE SURGERY COUPLE OF WEEKS AGO AND I NOW HAVE AN INFECTION IN MY LEFT EYE  This patient presents for a scheduled visit for debridement of pre-ulcerative plantar callus  Review of Systems  All other systems reviewed and are negative.      Today for a:   Physical Exam  Pleasant orientated 3 Bleeding callus plantar first MPJ right and left that remain closed after debridement. There is no surrounding erythema, edema, warmth, drainage    Assessment & Plan:   Assessment: Diabetic with a history of peripheral neuropathy Pre-ulcerative plantar callus sub-first MPJ bilaterally  Plan: Debridement of pre-ulcerative calluses 2 without any bleeding Maintain diabetic shoes with custom insoles  Reappoint 4 weeks

## 2014-11-29 DIAGNOSIS — H16041 Marginal corneal ulcer, right eye: Secondary | ICD-10-CM | POA: Diagnosis not present

## 2014-12-06 DIAGNOSIS — H16012 Central corneal ulcer, left eye: Secondary | ICD-10-CM | POA: Diagnosis not present

## 2014-12-11 ENCOUNTER — Telehealth: Payer: Self-pay | Admitting: Neurology

## 2014-12-11 MED ORDER — PHENYTOIN SODIUM EXTENDED 100 MG PO CAPS: ORAL_CAPSULE | ORAL | Status: DC

## 2014-12-11 NOTE — Telephone Encounter (Signed)
I called patient. She is tolerating the Dilantin fairly well, mild tremor over the last couple days. I will call in a prescription for the medication, she is on 200 mg at night.

## 2014-12-11 NOTE — Telephone Encounter (Signed)
I called the patient. She stated that recently she has started to have tremors in her hands. She stated this did not start as soon as she increased to 2 tablets of Dilantin. She is curious as to whether she should remain on Dilantin or try another medication. She states the shaking isn't terrible. She can still write. It is just something she has noticed. She also requested that any medication we prescribe go through [facility].

## 2014-12-11 NOTE — Telephone Encounter (Signed)
Patient is requesting to speak to Dr Jannifer Franklin regarding whether or not to refill phenytoin (DILANTIN) 100 MG ER capsule or prescribe something different. Started having shaking in hands a couple of days ago after increasing from 1 at bedtime to 2 at bedtime.

## 2014-12-12 DIAGNOSIS — L820 Inflamed seborrheic keratosis: Secondary | ICD-10-CM | POA: Diagnosis not present

## 2014-12-12 DIAGNOSIS — Z85820 Personal history of malignant melanoma of skin: Secondary | ICD-10-CM | POA: Diagnosis not present

## 2014-12-12 DIAGNOSIS — L814 Other melanin hyperpigmentation: Secondary | ICD-10-CM | POA: Diagnosis not present

## 2014-12-12 DIAGNOSIS — D225 Melanocytic nevi of trunk: Secondary | ICD-10-CM | POA: Diagnosis not present

## 2014-12-12 DIAGNOSIS — Z08 Encounter for follow-up examination after completed treatment for malignant neoplasm: Secondary | ICD-10-CM | POA: Diagnosis not present

## 2014-12-13 NOTE — Telephone Encounter (Signed)
Error

## 2014-12-25 ENCOUNTER — Ambulatory Visit (INDEPENDENT_AMBULATORY_CARE_PROVIDER_SITE_OTHER): Payer: Medicare Other | Admitting: Podiatry

## 2014-12-25 ENCOUNTER — Encounter: Payer: Self-pay | Admitting: Podiatry

## 2014-12-25 DIAGNOSIS — L84 Corns and callosities: Secondary | ICD-10-CM | POA: Diagnosis not present

## 2014-12-25 DIAGNOSIS — E1142 Type 2 diabetes mellitus with diabetic polyneuropathy: Secondary | ICD-10-CM

## 2014-12-25 DIAGNOSIS — G629 Polyneuropathy, unspecified: Secondary | ICD-10-CM

## 2014-12-25 NOTE — Patient Instructions (Signed)
Diabetes and Foot Care Diabetes Dehaven cause you to have problems because of poor blood supply (circulation) to your feet and legs. This Bouler cause the skin on your feet to become thinner, break easier, and heal more slowly. Your skin Yarde become dry, and the skin Badeaux peel and crack. You Yowell also have nerve damage in your legs and feet causing decreased feeling in them. You Croghan not notice minor injuries to your feet that could lead to infections or more serious problems. Taking care of your feet is one of the most important things you can do for yourself.  HOME CARE INSTRUCTIONS  Wear shoes at all times, even in the house. Do not go barefoot. Bare feet are easily injured.  Check your feet daily for blisters, cuts, and redness. If you cannot see the bottom of your feet, use a mirror or ask someone for help.  Wash your feet with warm water (do not use hot water) and mild soap. Then pat your feet and the areas between your toes until they are completely dry. Do not soak your feet as this can dry your skin.  Apply a moisturizing lotion or petroleum jelly (that does not contain alcohol and is unscented) to the skin on your feet and to dry, brittle toenails. Do not apply lotion between your toes.  Trim your toenails straight across. Do not dig under them or around the cuticle. File the edges of your nails with an emery board or nail file.  Do not cut corns or calluses or try to remove them with medicine.  Wear clean socks or stockings every day. Make sure they are not too tight. Do not wear knee-high stockings since they Talford decrease blood flow to your legs.  Wear shoes that fit properly and have enough cushioning. To break in new shoes, wear them for just a few hours a day. This prevents you from injuring your feet. Always look in your shoes before you put them on to be sure there are no objects inside.  Do not cross your legs. This Angelo decrease the blood flow to your feet.  If you find a minor scrape,  cut, or break in the skin on your feet, keep it and the skin around it clean and dry. These areas Dillehay be cleansed with mild soap and water. Do not cleanse the area with peroxide, alcohol, or iodine.  When you remove an adhesive bandage, be sure not to damage the skin around it.  If you have a wound, look at it several times a day to make sure it is healing.  Do not use heating pads or hot water bottles. They Celani burn your skin. If you have lost feeling in your feet or legs, you Shorten not know it is happening until it is too late.  Make sure your health care provider performs a complete foot exam at least annually or more often if you have foot problems. Report any cuts, sores, or bruises to your health care provider immediately. SEEK MEDICAL CARE IF:   You have an injury that is not healing.  You have cuts or breaks in the skin.  You have an ingrown nail.  You notice redness on your legs or feet.  You feel burning or tingling in your legs or feet.  You have pain or cramps in your legs and feet.  Your legs or feet are numb.  Your feet always feel cold. SEEK IMMEDIATE MEDICAL CARE IF:   There is increasing redness,   swelling, or pain in or around a wound.  There is a red line that goes up your leg.  Pus is coming from a wound.  You develop a fever or as directed by your health care provider.  You notice a bad smell coming from an ulcer or wound. Document Released: 04/17/2000 Document Revised: 12/21/2012 Document Reviewed: 09/27/2012 ExitCare Patient Information 2015 [employer]. This information is not intended to replace advice given to you by your health care provider. Make sure you discuss any questions you have with your health care provider.  

## 2014-12-25 NOTE — Progress Notes (Signed)
Patient ID: [name], female   DOB: 11/11/1929, 81 y.o.   MRN: [id number]  Subjective: This patient presents for ongoing debridement of pre-ulcerative plantar keratoses associated with diabetic peripheral neuropathy in the left foot at approximately 4 week intervals. Today she also requesting toenail debridement  Objective: Bleeding callus plantar left first MPJ that remains closed after debridement Plantar keratoses sub-first MPJ right with no bleeding within the callused Incurvated toenails 6-10  Assessment: Diabetic peripheral neuropathy Pre-ulcerative plantar keratoses 1 left Plantar keratoses 1 right Incurvated non-dystrophic toenails 6-10  Plan: Debridement plantar keratoses and pre-ulcerative keratoses 2 without any bleeding Debride nails 10 without any bleeding  Extend visit for debridement of pre-ulcerative plantar keratoses 6 weeks Continue to wear diabetic shoes with custom insoles

## 2014-12-27 ENCOUNTER — Encounter: Payer: Self-pay | Admitting: Endocrinology

## 2014-12-27 DIAGNOSIS — H25012 Cortical age-related cataract, left eye: Secondary | ICD-10-CM | POA: Diagnosis not present

## 2014-12-27 DIAGNOSIS — H2512 Age-related nuclear cataract, left eye: Secondary | ICD-10-CM | POA: Diagnosis not present

## 2014-12-27 DIAGNOSIS — Z961 Presence of intraocular lens: Secondary | ICD-10-CM | POA: Diagnosis not present

## 2014-12-27 LAB — HM DIABETES EYE EXAM

## 2015-01-03 ENCOUNTER — Other Ambulatory Visit: Payer: Self-pay | Admitting: Gastroenterology

## 2015-01-03 DIAGNOSIS — Z1211 Encounter for screening for malignant neoplasm of colon: Secondary | ICD-10-CM | POA: Diagnosis not present

## 2015-01-03 DIAGNOSIS — Z800 Family history of malignant neoplasm of digestive organs: Secondary | ICD-10-CM | POA: Diagnosis not present

## 2015-01-03 DIAGNOSIS — K635 Polyp of colon: Secondary | ICD-10-CM | POA: Diagnosis not present

## 2015-01-03 DIAGNOSIS — D120 Benign neoplasm of cecum: Secondary | ICD-10-CM | POA: Diagnosis not present

## 2015-01-16 ENCOUNTER — Ambulatory Visit (INDEPENDENT_AMBULATORY_CARE_PROVIDER_SITE_OTHER): Payer: Medicare Other | Admitting: Neurology

## 2015-01-16 ENCOUNTER — Telehealth: Payer: Self-pay | Admitting: Endocrinology

## 2015-01-16 ENCOUNTER — Encounter: Payer: Self-pay | Admitting: Neurology

## 2015-01-16 VITALS — BP 161/88 | HR 65 | Ht 67.0 in | Wt 135.0 lb

## 2015-01-16 DIAGNOSIS — R569 Unspecified convulsions: Secondary | ICD-10-CM | POA: Diagnosis not present

## 2015-01-16 DIAGNOSIS — I6522 Occlusion and stenosis of left carotid artery: Secondary | ICD-10-CM | POA: Diagnosis not present

## 2015-01-16 DIAGNOSIS — Z5181 Encounter for therapeutic drug level monitoring: Secondary | ICD-10-CM | POA: Diagnosis not present

## 2015-01-16 NOTE — Patient Instructions (Addendum)
We will start Vitamin D suplimentation, 1000 IU a day.   Epilepsy Epilepsy is a disorder in which a person has repeated seizures over time. A seizure is a release of abnormal electrical activity in the brain. Seizures can cause a change in attention, behavior, or the ability to remain awake and alert (altered mental status). Seizures often involve uncontrollable shaking (convulsions).  Most people with epilepsy lead normal lives. However, people with epilepsy are at an increased risk of falls, accidents, and injuries. Therefore, it is important to begin treatment right away. CAUSES  Epilepsy has many possible causes. Anything that disturbs the normal pattern of brain cell activity can lead to seizures. This Klink include:   Head injury.  Birth trauma.  High fever as a child.  Stroke.  Bleeding into or around the brain.  Certain drugs.  Prolonged low oxygen, such as what occurs after CPR efforts.  Abnormal brain development.  Certain illnesses, such as meningitis, encephalitis (brain infection), malaria, and other infections.  An imbalance of nerve signaling chemicals (neurotransmitters).  SIGNS AND SYMPTOMS  The symptoms of a seizure can vary greatly from one person to another. Right before a seizure, you Bealer have a warning (aura) that a seizure is about to occur. An aura Laura include the following symptoms:  Fear or anxiety.  Nausea.  Feeling like the room is spinning (vertigo).  Vision changes, such as seeing flashing lights or spots. Common symptoms during a seizure include:  Abnormal sensations, such as an abnormal smell or a bitter taste in the mouth.   Sudden, general body stiffness.   Convulsions that involve rhythmic jerking of the face, arm, or leg on one or both sides.   Sudden change in consciousness.   Appearing to be awake but not responding.   Appearing to be asleep but cannot be awakened.   Grimacing, chewing, lip smacking, drooling, tongue  biting, or loss of bowel or bladder control. After a seizure, you Mersch feel sleepy for a while. DIAGNOSIS  Your health care provider will ask about your symptoms and take a medical history. Descriptions from any witnesses to your seizures will be very helpful in the diagnosis. A physical exam, including a detailed neurological exam, is necessary. Various tests Haese be done, such as:   An electroencephalogram (EEG). This is a painless test of your brain waves. In this test, a diagram is created of your brain waves. These diagrams can be interpreted by a specialist.  An MRI of the brain.   A CT scan of the brain.   A spinal tap (lumbar puncture, LP).  Blood tests to check for signs of infection or abnormal blood chemistry. TREATMENT  There is no cure for epilepsy, but it is generally treatable. Once epilepsy is diagnosed, it is important to begin treatment as soon as possible. For most people with epilepsy, seizures can be controlled with medicines. The following Dingley also be used:  A pacemaker for the brain (vagus nerve stimulator) can be used for people with seizures that are not well controlled by medicine.  Surgery on the brain. For some people, epilepsy eventually goes away. HOME CARE INSTRUCTIONS   Follow your health care provider's recommendations on driving and safety in normal activities.  Get enough rest. Lack of sleep can cause seizures.  Only take over-the-counter or prescription medicines as directed by your health care provider. Take any prescribed medicine exactly as directed.  Avoid any known triggers of your seizures.  Keep a seizure diary. Record  what you recall about any seizure, especially any possible trigger.   Make sure the people you live and work with know that you are prone to seizures. They should receive instructions on how to help you. In general, a witness to a seizure should:   Cushion your head and body.   Turn you on your side.   Avoid  unnecessarily restraining you.   Not place anything inside your mouth.   Call for emergency medical help if there is any question about what has occurred.   Follow up with your health care provider as directed. You Mccole need regular blood tests to monitor the levels of your medicine.  SEEK MEDICAL CARE IF:   You develop signs of infection or other illness. This might increase the risk of a seizure.   You seem to be having more frequent seizures.   Your seizure pattern is changing.  SEEK IMMEDIATE MEDICAL CARE IF:   You have a seizure that does not stop after a few moments.   You have a seizure that causes any difficulty in breathing.   You have a seizure that results in a very severe headache.   You have a seizure that leaves you with the inability to speak or use a part of your body.  Document Released: 04/20/2005 Document Revised: 02/08/2013 Document Reviewed: 11/30/2012 [facility] Patient Information 2015 [place], [place]. This information is not intended to replace advice given to you by your health care provider. Make sure you discuss any questions you have with your health care provider.

## 2015-01-16 NOTE — Telephone Encounter (Signed)
Kristi from [facility] calling to clarify patient prescription, [phone] ref #[id number]

## 2015-01-16 NOTE — Progress Notes (Signed)
Reason for visit: Seizures  [name] is an 81 y.o. female  History of present illness:  [name] is an 81 year old right-handed white female with a history of a right frontal meningioma, status post resection. The patient has encephalomalacia at the site of surgery, and seizures subsequently. The patient has been tried on a multitude of anticonvulsives, finally she has been able to tolerate Dilantin at 200 mg at night. She last had a seizure on 11/06/2014 with tongue biting. The seizure occurred at night. The patient is not operating motor vehicle. She is having brief episodes of confusion, difficulty with word finding lasting up to 5 minutes, these episodes Lowy occur on average once a week. The last episode was one week ago. She has not had any falls. No more episodes of tongue biting or bowel or bladder incontinence have been noted. She returns to this office for an evaluation.  Past Medical History  Diagnosis Date  . Seasonal allergies   . HTN (hypertension)   . Osteoarthritis   . Palpitations   . Anemia   . History of colonic polyps   . GERD (gastroesophageal reflux disease)   . Hyperlipidemia   . Chronic facial pain   . Vitamin B12 deficiency   . Dyslipidemia   . Chronic foot pain   . Chronic cough   . Obesity   . Anxiety   . Complication of anesthesia     Sore jaw; could not chew or move mouth  . Diabetes mellitus     type 2 niddm x 20 years  . Metatarsal bone fracture 2014  . Hammer toe     bilateral  . Hx of radiation therapy 03/07/13- 03/29/13    right breast 4250 cGy 17 sessions  . Ejection fraction   . Skin cancer   . Melanoma   . Cancer of right breast 12/26/12    right breast 12:00 o'clock, DCIS  . Convulsions/seizures 10/16/2014    Past Surgical History  Procedure Laterality Date  . Colonoscopy    . Polypectomy      small adenomatous  . Abdominal hysterectomy    . Cholecystectomy    . Hernia repair    . Knee arthroscopy Bilateral   . Ulnar tunnel  release    . Eye surgery    . Cataract extraction w/ intraocular lens implant Right   . Bunionectomy    . Breast lumpectomy with needle localization Right 01/17/2013    Procedure: BREAST LUMPECTOMY WITH NEEDLE LOCALIZATION;  Surgeon: Edward Jolly, MD;  Location: [facility];  Service: General;  Laterality: Right;  . Breast biopsy Right 01/24/2013    Procedure: RE-EXCICION OF BREAST CANCER, ANTERIOR MARGINS;  Surgeon: Edward Jolly, MD;  Location: [facility];  Service: General;  Laterality: Right;  . Craniotomy Right 10/18/2013    Procedure: CRANIOTOMY TUMOR EXCISION;  Surgeon: Floyce Stakes, MD;  Location: [facility];  Service: Neurosurgery;  Laterality: Right;    Family History  Problem Relation Age of Onset  . Colon cancer    . Heart disease Mother   . Diabetes Father   . Pancreatic cancer Father   . Bone cancer Sister   . Prostate cancer Brother   . Colon cancer Brother   . Rectal cancer Sister   . Thyroid disease Sister     benign goiter resected    Social history:  reports that she has never smoked. She has never used smokeless tobacco. She reports that she does  not drink alcohol or use illicit drugs.    Allergies  Allergen Reactions  . Bystolic [Nebivolol Hcl] Other (See Comments)    "extreme weakness, heaviness in legs & arms, swelling in legs/arms/face, swollen abdomen, pain in bladder, feet pain, soreness in chest"  . Cholestyramine     "itching rash on stomach, bloated, nausea, vomiting, sleeplessness, extreme pain in arms"  . Hydrazine Yellow [Tartrazine] Other (See Comments)    "does not reduce high blood pressure, pain in arm, high pressure, felt like I was on verge of heart attack, really weak"  . Morphine Other (See Comments)    Feels morbid, weak, still in pain  . Niacin Palpitations    Fast heart beat  . Niaspan [Niacin Er] Palpitations and Other (See Comments)    "fast heart beat, high blood pressure"  . Norvasc [Amlodipine Besylate] Other (See  Comments)    "extreme fluid retention/pain)  . Optivar [Azelastine Hcl] Photosensitivity  . Sular [Nisoldipine Er] Other (See Comments)    "severe headaches, swelling eyes, hands, feet, shortness of breath, weak, flushed face, brain boiling, fluid retention, high blood sugar, nervous, heart fast beating"  . Tegretol [Carbamazepine] Other (See Comments)    Blood poisoning   . Telmisartan Other (See Comments)    "headache, difficulty urinating, high blood sugar, fluid retention"  . Cefdinir Swelling    Vaginal irritation, breathing,   . Clonidine Other (See Comments)    Dry mouth, fluid retention  . Clonidine Hydrochloride Other (See Comments)    Dry mouth, fluid retention  . Codeine Nausea And Vomiting  . Ezetimibe Other (See Comments)    Made weak  . Naproxen Other (See Comments)    Shrinks bladder  . Ziac [Bisoprolol-Hydrochlorothiazide] Other (See Comments)    "stopped urination"  . Elavil [Amitriptyline] Other (See Comments)    Gave Pt nightmares  . Keppra [Levetiracetam]     Shaking  . Lamictal [Lamotrigine]     itching  . Lyrica [Pregabalin] Swelling  . Topamax [Topiramate]     Dry eyes  . Ace Inhibitors Other (See Comments)    unknown  . Actonel [Risedronate Sodium] Other (See Comments)    unknown  . Amlodipine Besylate Other (See Comments)    unknown  . Aspirin Other (See Comments)    unknown  . Atacand [Candesartan] Other (See Comments)    unknown  . Bextra [Valdecoxib] Other (See Comments)    unknown  . Bisoprolol-Hydrochlorothiazide Other (See Comments)    unknown  . Candesartan Cilexetil Other (See Comments)    unknown  . Cefadroxil Other (See Comments)    unknown  . Celecoxib Rash  . Hydrocodone Other (See Comments)    unknown  . Hydrocodone-Acetaminophen Other (See Comments)    unknown  . Iodinated Diagnostic Agents Rash    "All over"  . Meloxicam Other (See Comments)    unknown  . Methylprednisolone Sodium Succinate Other (See Comments)     unknown  . Nabumetone Other (See Comments)    unknown  . Penicillins Other (See Comments)    unknown  . Pseudoephedrine-Guaifenesin Other (See Comments)    unknown  . Risedronate Sodium Other (See Comments)    unknown  . Rofecoxib Other (See Comments)    unknown  . Ru-Tuss [Chlorphen-Pse-Atrop-Hyos-Scop] Other (See Comments)    unknown  . Sulfonamide Derivatives Other (See Comments)    unknown  . Sulphur [Sulfur] Other (See Comments)    unknown  . Telithromycin Other (See Comments)    unknown  .  Terfenadine Other (See Comments)    unknown  . Trandolapril-Verapamil Hcl Er Other (See Comments)    Headache, difficulty urinating, high blood sugar, fluid retention  . Valium [Diazepam] Other (See Comments)    Makes her mean and hyper    Medications:  Prior to Admission medications   Medication Sig Start Date End Date Taking? Authorizing Provider  acetaminophen (TYLENOL) 500 MG tablet Take 1,000 mg by mouth every 6 (six) hours as needed for pain.   Yes Historical Provider, MD  ALPRAZolam (XANAX) 0.25 MG tablet Take 0.25 mg by mouth at bedtime.  02/13/11  Yes Ricard Dillon, MD  calcium carbonate (OS-CAL) 600 MG TABS Take 600 mg by mouth daily.    Yes Historical Provider, MD  colestipol (COLESTID) 1 G tablet Take 2 g by mouth 2 (two) times daily.   Yes Historical Provider, MD  cyanocobalamin (,VITAMIN B-12,) 1000 MCG/ML injection Inject 1,000 mcg into the muscle every 21 ( twenty-one) days.   Yes Historical Provider, MD  erythromycin ophthalmic ointment instill 1 drop into left eye three times a day 11/16/14  Yes Historical Provider, MD  fexofenadine (ALLEGRA) 60 MG tablet Take 60 mg by mouth every other day.    Yes Historical Provider, MD  furosemide (LASIX) 20 MG tablet Take 1 tablet (20 mg total) by mouth daily. Do not take until I call you about labs. Patient taking differently: Take 20 mg by mouth daily as needed for fluid or edema. Do not take until I call you about labs. 12/27/13   Yes Marin Olp, MD  lidocaine (LIDODERM) 5 % Place 1 patch onto the skin daily. Remove & Discard patch within 12 hours or as directed by MD Patient taking differently: Place 1 patch onto the skin daily as needed (for pain). Remove & Discard patch within 12 hours or as directed by MD 11/17/13  Yes Ricard Dillon, MD  metFORMIN (GLUCOPHAGE-XR) 500 MG 24 hr tablet 4 tabs daily Patient taking differently: Take 500 mg by mouth 4 (four) times daily -  with meals and at bedtime.  04/09/14  Yes Renato Shin, MD  nateglinide (STARLIX) 120 MG tablet Take 1 tablet (120 mg total) by mouth 3 (three) times daily with meals. 07/17/14  Yes Renato Shin, MD  phenytoin (DILANTIN) 100 MG ER capsule take 2 capsules at night 12/11/14  Yes Kathrynn Ducking, MD  Polyethyl Glycol-Propyl Glycol 0.4-0.3 % SOLN Place 2 drops into both eyes 2 (two) times daily as needed (for pain and dry eyes).   Yes Historical Provider, MD  ranitidine (ZANTAC) 150 MG capsule Take 150 mg by mouth daily as needed for heartburn.    Yes Historical Provider, MD  Preston Fleeting 2-180 MG per tablet TAKE 1 TABLET DAILY 02/06/14  Yes Carlena Bjornstad, MD    ROS:  Out of a complete 14 system review of symptoms, the patient complains only of the following symptoms, and all other reviewed systems are negative.  Eye itching Daytime sleepiness Back pain Memory loss, headache, seizures Confusion, anxiety  Blood pressure 161/88, pulse 65, height 5\' 7"  (1.702 m), weight 135 lb (61.236 kg).  Physical Exam  General: The patient is alert and cooperative at the time of the examination.  Skin: No significant peripheral edema is noted.   Neurologic Exam  Mental status: The patient is alert and oriented x 3 at the time of the examination. The patient has apparent normal recent and remote memory, with an apparently normal attention span and concentration  ability.   Cranial nerves: Facial symmetry is present. Speech is normal, no aphasia or dysarthria is  noted. Extraocular movements are full. Visual fields are full.  Motor: The patient has good strength in all 4 extremities.  Sensory examination: Soft touch sensation is symmetric on the face, arms, and legs.  Coordination: The patient has good finger-nose-finger and heel-to-shin bilaterally.  Gait and station: The patient has a normal gait. Tandem gait is slightly unsteady. Romberg is negative. No drift is seen.  Reflexes: Deep tendon reflexes are symmetric.   Assessment/Plan:  1. Right frontal meningioma, status post resection  2. Seizures secondary to #1  The patient is now on Dilantin. We will check blood work today. She is having brief episodes of slight confusion lasting up to 5 minutes, on average once a week. These events could potentially also represent seizures. If the Dilantin dosing can be increased, we will do this. The patient will go on vitamin D supplementation, 1000 international units daily. We will follow-up in 4 months, sooner if needed. The patient is not to operate a motor vehicle for least 6 months following the last seizure event on 11/06/2014.  Jill Alexanders MD 01/16/2015 9:06 PM  [facility] [address] [place], [place] [address]  Phone [phone] Fax [phone]

## 2015-01-17 ENCOUNTER — Telehealth: Payer: Self-pay | Admitting: Neurology

## 2015-01-17 LAB — CBC WITH DIFFERENTIAL/PLATELET
Basophils Absolute: 0 10*3/uL (ref 0.0–0.2)
Basos: 1 %
EOS (ABSOLUTE): 0.1 10*3/uL (ref 0.0–0.4)
Eos: 1 %
Hematocrit: 34 % (ref 34.0–46.6)
Hemoglobin: 11.1 g/dL (ref 11.1–15.9)
Immature Grans (Abs): 0 10*3/uL (ref 0.0–0.1)
Immature Granulocytes: 0 %
Lymphocytes Absolute: 1.6 10*3/uL (ref 0.7–3.1)
Lymphs: 27 %
MCH: 29.4 pg (ref 26.6–33.0)
MCHC: 32.6 g/dL (ref 31.5–35.7)
MCV: 90 fL (ref 79–97)
Monocytes Absolute: 0.5 10*3/uL (ref 0.1–0.9)
Monocytes: 8 %
Neutrophils Absolute: 3.9 10*3/uL (ref 1.4–7.0)
Neutrophils: 63 %
Platelets: 257 10*3/uL (ref 150–379)
RBC: 3.78 x10E6/uL (ref 3.77–5.28)
RDW: 15.7 % — ABNORMAL HIGH (ref 12.3–15.4)
WBC: 6.1 10*3/uL (ref 3.4–10.8)

## 2015-01-17 LAB — COMPREHENSIVE METABOLIC PANEL
ALT: 20 IU/L (ref 0–32)
AST: 14 IU/L (ref 0–40)
Albumin/Globulin Ratio: 2.2 (ref 1.1–2.5)
Albumin: 4 g/dL (ref 3.5–4.7)
Alkaline Phosphatase: 85 IU/L (ref 39–117)
BUN/Creatinine Ratio: 12 (ref 11–26)
BUN: 9 mg/dL (ref 8–27)
Bilirubin Total: <0.2 mg/dL (ref 0.0–1.2)
CO2: 22 mmol/L (ref 18–29)
Calcium: 9.2 mg/dL (ref 8.7–10.3)
Chloride: 104 mmol/L (ref 97–108)
Creatinine, Ser: 0.74 mg/dL (ref 0.57–1.00)
GFR calc Af Amer: 87 mL/min/{1.73_m2} (ref >59)
GFR calc non Af Amer: 76 mL/min/{1.73_m2} (ref >59)
Globulin, Total: 1.8 g/dL (ref 1.5–4.5)
Glucose: 138 mg/dL — ABNORMAL HIGH (ref 65–99)
Potassium: 4 mmol/L (ref 3.5–5.2)
Sodium: 143 mmol/L (ref 134–144)
Total Protein: 5.8 g/dL — ABNORMAL LOW (ref 6.0–8.5)

## 2015-01-17 LAB — PHENYTOIN LEVEL, TOTAL: Phenytoin (Dilantin), Serum: 2.9 ug/mL — ABNORMAL LOW (ref 10.0–20.0)

## 2015-01-17 MED ORDER — PHENYTOIN SODIUM EXTENDED 100 MG PO CAPS: 300.0000 mg | ORAL_CAPSULE | Freq: Every day | ORAL | Status: DC

## 2015-01-17 NOTE — Telephone Encounter (Signed)
Called patient. The blood work shows a relatively unremarkable comprehensive metabolic profile, CBC. The Dilantin level is low at 2.9. I will go up to the 300 mg nightly dose of the Dilantin. The patient is contact me if she has side effects.

## 2015-01-17 NOTE — Telephone Encounter (Signed)
Pharmacy called wanting to verify the nategilnide. The current rx stated the pt is taking 120 mg tid. Is this correct? Please advise, Thanks!

## 2015-01-17 NOTE — Telephone Encounter (Signed)
Yes, correct 

## 2015-01-18 NOTE — Telephone Encounter (Signed)
I contacted the pharmacy and and advised of correct dosage. They advised me the pt had cancelled the order because it was not correct. I attempted to reach the pt but was not able to reach her. I will try to contact her at a later time.

## 2015-01-21 NOTE — Telephone Encounter (Signed)
Requested call back from the patient to advise.

## 2015-01-22 ENCOUNTER — Encounter: Payer: Self-pay | Admitting: Internal Medicine

## 2015-01-22 ENCOUNTER — Ambulatory Visit (INDEPENDENT_AMBULATORY_CARE_PROVIDER_SITE_OTHER): Payer: Medicare Other | Admitting: Internal Medicine

## 2015-01-22 VITALS — BP 160/90 | HR 76 | Temp 97.8°F | Resp 14 | Ht 67.5 in | Wt 136.1 lb

## 2015-01-22 DIAGNOSIS — I10 Essential (primary) hypertension: Secondary | ICD-10-CM | POA: Diagnosis not present

## 2015-01-22 DIAGNOSIS — E1151 Type 2 diabetes mellitus with diabetic peripheral angiopathy without gangrene: Secondary | ICD-10-CM | POA: Diagnosis not present

## 2015-01-22 DIAGNOSIS — R569 Unspecified convulsions: Secondary | ICD-10-CM | POA: Diagnosis not present

## 2015-01-22 DIAGNOSIS — E785 Hyperlipidemia, unspecified: Secondary | ICD-10-CM | POA: Diagnosis not present

## 2015-01-22 DIAGNOSIS — I6522 Occlusion and stenosis of left carotid artery: Secondary | ICD-10-CM | POA: Diagnosis not present

## 2015-01-22 NOTE — Progress Notes (Signed)
Pre visit review using our clinic review tool, if applicable. No additional management support is needed unless otherwise documented below in the visit note. 

## 2015-01-22 NOTE — Patient Instructions (Signed)
We have given you the letter for the claims department with the information you need on it.   We will not change the medicines today but see you back in about 6 months and if the blood pressure is still high can consider trying to change the dose or medicine you are taking.   Fall Prevention and Home Safety Falls cause injuries and can affect all age groups. It is possible to use preventive measures to significantly decrease the likelihood of falls. There are many simple measures which can make your home safer and prevent falls. OUTDOORS  Repair cracks and edges of walkways and driveways.  Remove high doorway thresholds.  Trim shrubbery on the main path into your home.  Have good outside lighting.  Clear walkways of tools, rocks, debris, and clutter.  Check that handrails are not broken and are securely fastened. Both sides of steps should have handrails.  Have leaves, snow, and ice cleared regularly.  Use sand or salt on walkways during winter months.  In the garage, clean up grease or oil spills. BATHROOM  Install night lights.  Install grab bars by the toilet and in the tub and shower.  Use non-skid mats or decals in the tub or shower.  Place a plastic non-slip stool in the shower to sit on, if needed.  Keep floors dry and clean up all water on the floor immediately.  Remove soap buildup in the tub or shower on a regular basis.  Secure bath mats with non-slip, double-sided rug tape.  Remove throw rugs and tripping hazards from the floors. BEDROOMS  Install night lights.  Make sure a bedside light is easy to reach.  Do not use oversized bedding.  Keep a telephone by your bedside.  Have a firm chair with side arms to use for getting dressed.  Remove throw rugs and tripping hazards from the floor. KITCHEN  Keep handles on pots and pans turned toward the center of the stove. Use back burners when possible.  Clean up spills quickly and allow time for  drying.  Avoid walking on wet floors.  Avoid hot utensils and knives.  Position shelves so they are not too high or low.  Place commonly used objects within easy reach.  If necessary, use a sturdy step stool with a grab bar when reaching.  Keep electrical cables out of the way.  Do not use floor polish or wax that makes floors slippery. If you must use wax, use non-skid floor wax.  Remove throw rugs and tripping hazards from the floor. STAIRWAYS  Never leave objects on stairs.  Place handrails on both sides of stairways and use them. Fix any loose handrails. Make sure handrails on both sides of the stairways are as long as the stairs.  Check carpeting to make sure it is firmly attached along stairs. Make repairs to worn or loose carpet promptly.  Avoid placing throw rugs at the top or bottom of stairways, or properly secure the rug with carpet tape to prevent slippage. Get rid of throw rugs, if possible.  Have an electrician put in a light switch at the top and bottom of the stairs. OTHER FALL PREVENTION TIPS  Wear low-heel or rubber-soled shoes that are supportive and fit well. Wear closed toe shoes.  When using a stepladder, make sure it is fully opened and both spreaders are firmly locked. Do not climb a closed stepladder.  Add color or contrast paint or tape to grab bars and handrails in your home.  Place contrasting color strips on first and last steps.  Learn and use mobility aids as needed. Install an electrical emergency response system.  Turn on lights to avoid dark areas. Replace light bulbs that burn out immediately. Get light switches that glow.  Arrange furniture to create clear pathways. Keep furniture in the same place.  Firmly attach carpet with non-skid or double-sided tape.  Eliminate uneven floor surfaces.  Select a carpet pattern that does not visually hide the edge of steps.  Be aware of all pets. OTHER HOME SAFETY TIPS  Set the water temperature  for 120 F (48.8 C).  Keep emergency numbers on or near the telephone.  Keep smoke detectors on every level of the home and near sleeping areas. Document Released: 04/10/2002 Document Revised: 10/20/2011 Document Reviewed: 07/10/2011 [facility] Patient Information 2015 [place], [place]. This information is not intended to replace advice given to you by your health care provider. Make sure you discuss any questions you have with your health care provider.

## 2015-01-23 NOTE — Progress Notes (Signed)
   Subjective:    Patient ID: [name], female    DOB: 04/27/1930, 81 y.o.   MRN: [id number]  HPI The patient is an 81 YO female coming in for follow up of her chronic problems including: hypertension (BP elevated today, extensive allergies, taking lasix and tarka 2-355, not complicated), hyperlipidemia (taking colestipol, not able to tolerate statins, unclear if she is at goal), GERD (using ranitidine prn and controlled, not complicated). No new complaints.   PMH, [facility], social history reviewed and updated.   Review of Systems  Constitutional: Positive for activity change and fatigue. Negative for fever, chills, appetite change and unexpected weight change.  HENT: Negative.   Eyes: Negative.   Respiratory: Negative for cough, chest tightness, shortness of breath and wheezing.   Cardiovascular: Negative for chest pain, palpitations and leg swelling.  Gastrointestinal: Negative for nausea, abdominal pain, diarrhea, constipation and abdominal distention.  Musculoskeletal: Positive for back pain and arthralgias. Negative for myalgias.  Skin: Negative.   Neurological: Positive for seizures and weakness. Negative for dizziness, light-headedness and headaches.  Psychiatric/Behavioral: Negative.       Objective:   Physical Exam  Constitutional: She appears well-developed and well-nourished.  HENT:  Head: Normocephalic.  Eyes: EOM are normal.  Neck: Normal range of motion.  Cardiovascular: Normal rate and regular rhythm.   Pulmonary/Chest: Effort normal and breath sounds normal.  Abdominal: Soft. Bowel sounds are normal. She exhibits no distension. There is no tenderness. There is no rebound.  Musculoskeletal: She exhibits no edema.  Neurological: Coordination abnormal.  Balance is poor  Skin: Skin is warm and dry.   Filed Vitals:   01/22/15 1413  BP: 160/90  Pulse: 76  Temp: 97.8 F (36.6 C)  TempSrc: Oral  Resp: 14  Height: 5' 7.5" (1.715 m)  Weight: 136 lb 1.9 oz (61.744 kg)    SpO2: 98%      Assessment & Plan:

## 2015-01-24 ENCOUNTER — Encounter: Payer: Self-pay | Admitting: Internal Medicine

## 2015-01-24 NOTE — Assessment & Plan Note (Signed)
She does have still some seizures and working on adjusting her dosing. Gave her letter to state that she should not be alone for her long term care.

## 2015-01-24 NOTE — Assessment & Plan Note (Signed)
Seeing endo and able to tolerate metformin and starlix.

## 2015-01-24 NOTE — Assessment & Plan Note (Signed)
BP is moderately elevated today and per her reports is better at home. Given her extensive list of allergies will hold off on changes today. Continue lasix, tarka. Reviewed recent labs and no labs today.

## 2015-01-24 NOTE — Assessment & Plan Note (Signed)
Currently taking colestipol and allergic to statins. Last lipid panel at goal and given recent labs will not draw until next visit. Getting records from last PCP.

## 2015-01-25 ENCOUNTER — Telehealth: Payer: Self-pay | Admitting: *Deleted

## 2015-01-25 MED ORDER — LIDOCAINE 5 % EX PTCH: 1.0000 | MEDICATED_PATCH | CUTANEOUS | Status: DC

## 2015-01-25 MED ORDER — ALPRAZOLAM 0.25 MG PO TABS: 0.2500 mg | ORAL_TABLET | Freq: Every day | ORAL | Status: DC

## 2015-01-25 MED ORDER — CYANOCOBALAMIN 1000 MCG/ML IJ SOLN: 1000.0000 ug | INTRAMUSCULAR | Status: DC

## 2015-01-25 NOTE — Telephone Encounter (Signed)
Notified pt with md response. Verified which local pharmacy she uses faxed alprazolam to [facility]...Johny Chess

## 2015-01-25 NOTE — Telephone Encounter (Signed)
Receive call pt states she forgot to get refills whn she saw md on Wednesday. She is requesting alprazolam, B12, and lidoderm patches 90 day supply [facility]...Johny Chess

## 2015-01-25 NOTE — Telephone Encounter (Signed)
Xanax will not be filled for 90 days but 30 days supply approved. [facility].

## 2015-01-30 ENCOUNTER — Telehealth: Payer: Self-pay | Admitting: Neurology

## 2015-01-30 NOTE — Telephone Encounter (Signed)
[facility] [phone] ext [phone] called to gather information on patient. Please call back.

## 2015-01-31 NOTE — Telephone Encounter (Signed)
[name] [phone] is calling back. States no one has called her back yet.

## 2015-01-31 NOTE — Telephone Encounter (Signed)
I called Amber and left a voicemail. 

## 2015-02-04 ENCOUNTER — Telehealth: Payer: Self-pay | Admitting: Internal Medicine

## 2015-02-04 ENCOUNTER — Encounter (HOSPITAL_COMMUNITY): Payer: Self-pay | Admitting: *Deleted

## 2015-02-04 ENCOUNTER — Emergency Department (HOSPITAL_COMMUNITY)
Admission: EM | Admit: 2015-02-04 | Discharge: 2015-02-04 | Disposition: A | Discharge status: Home / Self Care | Payer: Medicare Other | Attending: Emergency Medicine | Admitting: Emergency Medicine

## 2015-02-04 ENCOUNTER — Telehealth: Payer: Self-pay | Admitting: Neurology

## 2015-02-04 DIAGNOSIS — M1990 Unspecified osteoarthritis, unspecified site: Secondary | ICD-10-CM | POA: Diagnosis not present

## 2015-02-04 DIAGNOSIS — E669 Obesity, unspecified: Secondary | ICD-10-CM | POA: Diagnosis not present

## 2015-02-04 DIAGNOSIS — G8929 Other chronic pain: Secondary | ICD-10-CM | POA: Insufficient documentation

## 2015-02-04 DIAGNOSIS — I10 Essential (primary) hypertension: Secondary | ICD-10-CM | POA: Diagnosis not present

## 2015-02-04 DIAGNOSIS — Z862 Personal history of diseases of the blood and blood-forming organs and certain disorders involving the immune mechanism: Secondary | ICD-10-CM | POA: Insufficient documentation

## 2015-02-04 DIAGNOSIS — L299 Pruritus, unspecified: Secondary | ICD-10-CM | POA: Insufficient documentation

## 2015-02-04 DIAGNOSIS — Z79899 Other long term (current) drug therapy: Secondary | ICD-10-CM | POA: Diagnosis not present

## 2015-02-04 DIAGNOSIS — T420X5A Adverse effect of hydantoin derivatives, initial encounter: Secondary | ICD-10-CM | POA: Diagnosis not present

## 2015-02-04 DIAGNOSIS — K219 Gastro-esophageal reflux disease without esophagitis: Secondary | ICD-10-CM | POA: Diagnosis not present

## 2015-02-04 DIAGNOSIS — Z85828 Personal history of other malignant neoplasm of skin: Secondary | ICD-10-CM | POA: Diagnosis not present

## 2015-02-04 DIAGNOSIS — T50905A Adverse effect of unspecified drugs, medicaments and biological substances, initial encounter: Secondary | ICD-10-CM

## 2015-02-04 DIAGNOSIS — E119 Type 2 diabetes mellitus without complications: Secondary | ICD-10-CM | POA: Diagnosis not present

## 2015-02-04 DIAGNOSIS — Z880 Allergy status to penicillin: Secondary | ICD-10-CM | POA: Diagnosis not present

## 2015-02-04 DIAGNOSIS — Z853 Personal history of malignant neoplasm of breast: Secondary | ICD-10-CM | POA: Insufficient documentation

## 2015-02-04 DIAGNOSIS — R410 Disorientation, unspecified: Secondary | ICD-10-CM | POA: Diagnosis not present

## 2015-02-04 DIAGNOSIS — Z86010 Personal history of colonic polyps: Secondary | ICD-10-CM | POA: Diagnosis not present

## 2015-02-04 LAB — COMPREHENSIVE METABOLIC PANEL
ALT: 21 U/L (ref 14–54)
AST: 20 U/L (ref 15–41)
Albumin: 3.5 g/dL (ref 3.5–5.0)
Alkaline Phosphatase: 82 U/L (ref 38–126)
Anion gap: 9 (ref 5–15)
BUN: 13 mg/dL (ref 6–20)
CO2: 23 mmol/L (ref 22–32)
Calcium: 8.3 mg/dL — ABNORMAL LOW (ref 8.9–10.3)
Chloride: 100 mmol/L — ABNORMAL LOW (ref 101–111)
Creatinine, Ser: 0.85 mg/dL (ref 0.44–1.00)
GFR calc Af Amer: >60 mL/min (ref >60)
GFR calc non Af Amer: >60 mL/min (ref >60)
Glucose, Bld: 123 mg/dL — ABNORMAL HIGH (ref 65–99)
Potassium: 4 mmol/L (ref 3.5–5.1)
Sodium: 132 mmol/L — ABNORMAL LOW (ref 135–145)
Total Bilirubin: 0.4 mg/dL (ref 0.3–1.2)
Total Protein: 5.4 g/dL — ABNORMAL LOW (ref 6.5–8.1)

## 2015-02-04 LAB — PHENYTOIN LEVEL, TOTAL: Phenytoin Lvl: 3 ug/mL — ABNORMAL LOW (ref 10.0–20.0)

## 2015-02-04 MED ORDER — DIPHENHYDRAMINE HCL 25 MG PO CAPS
25.0000 mg | ORAL_CAPSULE | Freq: Once | ORAL | Status: AC
  Administered 2015-02-04: 25 mg via ORAL
  Filled 2015-02-04: qty 1

## 2015-02-04 NOTE — Telephone Encounter (Signed)
Pt called before going the ED stating that she wanted an appt with Dr. Jannifer Franklin on Wed. It was expressed to the pt the go to ED and that her nurse here would be notified.

## 2015-02-04 NOTE — Telephone Encounter (Signed)
Pt called stating she thinks she is having a reaction to phenytoin (DILANTIN) 100 MG ER capsule since increasing medication to 3 capsule on 01/17/15. She is having intermittent itching all over-no rash except on shoulder but skin turns red, crying spells, not sleeping, intermittent depression. Pt called pharmacy yesterday and she was  advised her to go to ED. And also advised her not to stop taking medication as she could have severe seizure so pt took 2 capsules last night. Please call and advise at [phone]

## 2015-02-04 NOTE — Telephone Encounter (Signed)
I called patient and left voicemail asking her to call me back. Per [facility], patient was going to the ED.

## 2015-02-04 NOTE — ED Notes (Signed)
Pt has several allergies, is taking dilantin for seizures and had increase in dosage one month ago. Since then pt is unable to sleep, reports depression, anxiety and itching. Denies SI or HI.

## 2015-02-04 NOTE — Telephone Encounter (Signed)
I called the patient. She described all the same symptoms listed below. She has not started any new medications recently. I spoke to Dr. Rexene Alberts [facility]) who advised the patient go to the ED. Dr. Rexene Alberts felt like the patient needs to be evaluated urgently. I relayed this to the patient. She stated she does not feel like going to the ED. She states she just wants to sleep because she didn't sleep much last night. I again advised that with the symptoms she is having she needs to be treated. She stated she would think about it. She really wants to talk to Dr. Jannifer Franklin. I advised I could make an appointment for her on Wednesday when he returns to the office, but that this would not be our first choice of action in this situation. She stated she would think about it and call back.

## 2015-02-04 NOTE — ED Notes (Signed)
Pt complaining of long wait on waiting room, states she is very hungry and she will like to get treatment soon. Pt updated on plan of care.

## 2015-02-04 NOTE — ED Provider Notes (Signed)
CSN: [id number]     Arrival date & time 02/04/15  1548 History   First MD Initiated Contact with Patient 02/04/15 [place]     Chief Complaint  Patient presents with  . Allergic Reaction     (Consider location/radiation/quality/duration/timing/severity/associated sxs/prior Treatment) HPI  Increased her dose of phenytoin few weeks ago and since that time as a progressively worsening pruritus, confusion, restlessness, depression. No fevers, nausea, vomiting, urinary symptoms. Has had decrease in seizures and thus did not want to stop her phenytoin. No other associated symptoms.  Past Medical History  Diagnosis Date  . Seasonal allergies   . HTN (hypertension)   . Osteoarthritis   . Palpitations   . Anemia   . History of colonic polyps   . GERD (gastroesophageal reflux disease)   . Hyperlipidemia   . Chronic facial pain   . Vitamin B12 deficiency   . Dyslipidemia   . Chronic foot pain   . Chronic cough   . Obesity   . Anxiety   . Complication of anesthesia     Sore jaw; could not chew or move mouth  . Diabetes mellitus     type 2 niddm x 20 years  . Metatarsal bone fracture 2014  . Hammer toe     bilateral  . Hx of radiation therapy 03/07/13- 03/29/13    right breast 4250 cGy 17 sessions  . Ejection fraction   . Skin cancer   . Melanoma ([place])   . Cancer of right breast ([place]) 12/26/12    right breast 12:00 o'clock, DCIS  . Convulsions/seizures ([place]) 10/16/2014   Past Surgical History  Procedure Laterality Date  . Colonoscopy    . Polypectomy      small adenomatous  . Abdominal hysterectomy    . Cholecystectomy    . Hernia repair    . Knee arthroscopy Bilateral   . Ulnar tunnel release    . Eye surgery    . Cataract extraction w/ intraocular lens implant Right   . Bunionectomy    . Breast lumpectomy with needle localization Right 01/17/2013    Procedure: BREAST LUMPECTOMY WITH NEEDLE LOCALIZATION;  Surgeon: Edward Jolly, MD;  Location: [facility];  Service: General;   Laterality: Right;  . Breast biopsy Right 01/24/2013    Procedure: RE-EXCICION OF BREAST CANCER, ANTERIOR MARGINS;  Surgeon: Edward Jolly, MD;  Location: [facility];  Service: General;  Laterality: Right;  . Craniotomy Right 10/18/2013    Procedure: CRANIOTOMY TUMOR EXCISION;  Surgeon: Floyce Stakes, MD;  Location: [facility];  Service: Neurosurgery;  Laterality: Right;   Family History  Problem Relation Age of Onset  . Colon cancer    . Heart disease Mother   . Diabetes Father   . Pancreatic cancer Father   . Bone cancer Sister   . Prostate cancer Brother   . Colon cancer Brother   . Rectal cancer Sister   . Thyroid disease Sister     benign goiter resected   Social History  Substance Use Topics  . Smoking status: Never Smoker   . Smokeless tobacco: Never Used  . Alcohol Use: No   OB History    No data available     Review of Systems  Constitutional: Negative for fever and chills.  Genitourinary: Negative for dysuria.  Musculoskeletal: Negative for myalgias and back pain.  Skin: Negative for rash and wound.  Neurological: Negative for seizures and syncope.  Psychiatric/Behavioral: Positive for confusion and dysphoric mood. Negative for  self-injury.  All other systems reviewed and are negative.     Allergies  Bystolic; Cholestyramine; Hydrazine yellow; Morphine; Niacin; Niaspan; Norvasc; [place] Gum; Tegretol; Telmisartan; Cefdinir; Clonidine; Clonidine hydrochloride; Codeine; Ezetimibe; Naproxen; Ziac; Elavil; Keppra; Lamictal; Lyrica; Topamax; Ace inhibitors; Actonel; Amlodipine besylate; Aspirin; Atacand; Bextra; Bisoprolol-hydrochlorothiazide; Candesartan cilexetil; Cefadroxil; Celecoxib; Hydrocodone; Hydrocodone-acetaminophen; Iodinated diagnostic agents; Meloxicam; Methylprednisolone sodium succinate; Nabumetone; Penicillins; Pseudoephedrine-guaifenesin; Risedronate sodium; Rofecoxib; Ru-tuss; Sulfonamide derivatives; [place]; Telithromycin; Terfenadine;  Trandolapril-verapamil hcl er; and Valium  Home Medications   Prior to Admission medications   Medication Sig Start Date End Date Taking? Authorizing Provider  acetaminophen (TYLENOL) 500 MG tablet Take 1,000 mg by mouth every 6 (six) hours as needed for pain.   Yes Historical Provider, MD  ALPRAZolam (XANAX) 0.25 MG tablet Take 1 tablet (0.25 mg total) by mouth at bedtime. 01/25/15  Yes Hoyt Koch, MD  calcium carbonate (OS-CAL) 600 MG TABS Take 600 mg by mouth daily.    Yes Historical Provider, MD  colestipol (COLESTID) 1 G tablet Take 2 g by mouth 2 (two) times daily.   Yes Historical Provider, MD  cyanocobalamin (,VITAMIN B-12,) 1000 MCG/ML injection Inject 1 mL (1,000 mcg total) into the muscle every 21 ( twenty-one) days. 01/25/15  Yes Hoyt Koch, MD  fexofenadine (ALLEGRA) 60 MG tablet Take 60 mg by mouth daily as needed for allergies.    Yes Historical Provider, MD  furosemide (LASIX) 20 MG tablet Take 1 tablet (20 mg total) by mouth daily. Do not take until I call you about labs. Patient taking differently: Take 20 mg by mouth daily as needed for fluid or edema. Do not take until I call you about labs. 12/27/13  Yes Marin Olp, MD  lidocaine (LIDODERM) 5 % Place 1 patch onto the skin daily. Remove & Discard patch within 12 hours or as directed by MD Patient taking differently: Place 1 patch onto the skin daily as needed (pain). Remove & Discard patch within 12 hours or as directed by MD 01/25/15  Yes Hoyt Koch, MD  metFORMIN (GLUCOPHAGE-XR) 500 MG 24 hr tablet 4 tabs daily Patient taking differently: Take 500 mg by mouth 4 (four) times daily -  with meals and at bedtime.  04/09/14  Yes Renato Shin, MD  nateglinide (STARLIX) 120 MG tablet Take 1 tablet (120 mg total) by mouth 3 (three) times daily with meals. 07/17/14  Yes Renato Shin, MD  phenytoin (DILANTIN) 100 MG ER capsule Take 3 capsules (300 mg total) by mouth at bedtime. 01/17/15  Yes Kathrynn Ducking, MD  Polyethyl Glycol-Propyl Glycol 0.4-0.3 % SOLN Place 2 drops into both eyes at bedtime.    Yes Historical Provider, MD  ranitidine (ZANTAC) 150 MG capsule Take 150 mg by mouth daily as needed for heartburn.    Yes Historical Provider, MD  TARKA 2-180 MG per tablet TAKE 1 TABLET DAILY 02/06/14  Yes Carlena Bjornstad, MD   BP 151/68 mmHg  Pulse 73  Temp(Src) 98.3 F (36.8 C) (Oral)  Resp 16  SpO2 96% Physical Exam  Constitutional: She is oriented to person, place, and time. She appears well-developed and well-nourished.  HENT:  Head: Normocephalic and atraumatic.  Eyes: Conjunctivae and EOM are normal. Right eye exhibits no discharge. Left eye exhibits no discharge.  Cardiovascular: Normal rate and regular rhythm.   Pulmonary/Chest: Effort normal and breath sounds normal. No respiratory distress.  Abdominal: Soft. She exhibits no distension. There is no tenderness. There is no rebound.  Musculoskeletal: Normal  range of motion. She exhibits no edema or tenderness.  Neurological: She is alert and oriented to person, place, and time.  Skin: Skin is warm and dry.  Nursing note and vitals reviewed.   ED Course  Procedures (including critical care time) Labs Review Labs Reviewed  COMPREHENSIVE METABOLIC PANEL - Abnormal; Notable for the following:    Sodium 132 (*)    Chloride 100 (*)    Glucose, Bld 123 (*)    Calcium 8.3 (*)    Total Protein 5.4 (*)    All other components within normal limits  PHENYTOIN LEVEL, TOTAL - Abnormal; Notable for the following:    Phenytoin Lvl 3.0 (*)    All other components within normal limits  URINALYSIS, ROUTINE W REFLEX MICROSCOPIC (NOT AT [facility])    Imaging Review No results found. I have personally reviewed and evaluated these images and lab results as part of my medical decision-making.   EKG Interpretation None      MDM   Final diagnoses:  Medication adverse effect, initial encounter   We'll check phenytoin level to be sure  she is not supratherapeutic. We'll check urinalysis to ensure that she doesn't have urinary tract infection that could be causing her symptoms as well. Secondary to pruritus from check liver function labs to make sure bilirubin is not elevated. If these labs are all okay I will suggest switching her phenytoin tomorrow morningand take Benadryl as needed for sleep and following up with a neurologist for further medication changes.  Labs withotu evidence of liver damage. Will dc with plan as described.   I have personally and contemperaneously reviewed labs and imaging and used in my decision making as above.   A medical screening exam was performed and I feel the patient has had an appropriate workup for their chief complaint at this time and likelihood of emergent condition existing is low. They have been counseled on decision, discharge, follow up and which symptoms necessitate immediate return to the emergency department. They or their family verbally stated understanding and agreement with plan and discharged in stable condition.      Merrily Pew, MD 02/05/15 [phone]

## 2015-02-04 NOTE — Telephone Encounter (Signed)
Left message for Amber to call me back.

## 2015-02-04 NOTE — ED Notes (Signed)
Family member and pt complaining about the time waiting on the ED, pt state that if we are not going to keep her on the hospital they will like to be release and go home, MD to be notified.

## 2015-02-04 NOTE — Telephone Encounter (Signed)
Amber from [place] wants to talk with you about a letter that was sent from Dr. Sharlet Salina dated 9/20 She can be reached at [place]

## 2015-02-04 NOTE — Telephone Encounter (Signed)
I called Amber. She needed to know when the patient's last visit and next visit is. I gave her this information. She asked if anything new happened to the patient. I advised she is still having seizures and Dr. Jannifer Franklin adjusted her medications when she was last here.

## 2015-02-04 NOTE — ED Notes (Signed)
Pt took her gown out and dress her self with street clothes. Pt is eating supper from hospital cafeteria at this time. States she can't give [place] a urine sample at this time. MD to be notified.

## 2015-02-05 ENCOUNTER — Ambulatory Visit (INDEPENDENT_AMBULATORY_CARE_PROVIDER_SITE_OTHER): Payer: Medicare Other | Admitting: Podiatry

## 2015-02-05 ENCOUNTER — Encounter: Payer: Self-pay | Admitting: Podiatry

## 2015-02-05 DIAGNOSIS — L84 Corns and callosities: Secondary | ICD-10-CM | POA: Diagnosis not present

## 2015-02-05 DIAGNOSIS — E1142 Type 2 diabetes mellitus with diabetic polyneuropathy: Secondary | ICD-10-CM

## 2015-02-05 NOTE — Telephone Encounter (Signed)
I tried to call Manuela Schwartz. Line busy.

## 2015-02-05 NOTE — Telephone Encounter (Signed)
The patient called back. [name] went to the ED. They checked her Dilantin level and said it was not toxic, per patient. It was actually low (3.0). ED doctor advised [name] follow up with Dr. Jannifer Franklin. Appointment scheduled for tomorrow 10/5. [name] states [name] fell last night on her bottom and it took her a while to get up. [name] is not sure exactly how or why [name] fell. I advised I would let Dr. Jannifer Franklin know what is going on prior to appointment tomorrow.

## 2015-02-05 NOTE — Telephone Encounter (Signed)
I called the patient. She is trying to get her niece to bring her at 56. I advised I could work her in at 29, but felt like if she could get here at 4 that would be best. She verbalized understanding and stated she would call me back to let me know which time she plans on coming.

## 2015-02-05 NOTE — Patient Instructions (Signed)
Diabetes and Foot Care Diabetes Haq cause you to have problems because of poor blood supply (circulation) to your feet and legs. This [place] cause the skin on your feet to become thinner, break easier, and heal more slowly. Your skin [place] become dry, and the skin [place] peel and crack. You Washer also have nerve damage in your legs and feet causing decreased feeling in them. You [place] not notice minor injuries to your feet that could lead to infections or more serious problems. Taking care of your feet is one of the most important things you can do for yourself.  HOME CARE INSTRUCTIONS  Wear shoes at all times, even in the house. Do not go barefoot. Bare feet are easily injured.  Check your feet daily for blisters, cuts, and redness. If you cannot see the bottom of your feet, use a mirror or ask someone for help.  Wash your feet with warm water (do not use hot water) and mild soap. Then pat your feet and the areas between your toes until they are completely dry. Do not soak your feet as this can dry your skin.  Apply a moisturizing lotion or petroleum jelly (that does not contain alcohol and is unscented) to the skin on your feet and to dry, brittle toenails. Do not apply lotion between your toes.  Trim your toenails straight across. Do not dig under them or around the cuticle. File the edges of your nails with an emery board or nail file.  Do not cut corns or calluses or try to remove them with medicine.  Wear clean socks or stockings every day. Make sure they are not too tight. Do not wear knee-high stockings since they [place] decrease blood flow to your legs.  Wear shoes that fit properly and have enough cushioning. To break in new shoes, wear them for just a few hours a day. This prevents you from injuring your feet. Always look in your shoes before you put them on to be sure there are no objects inside.  Do not cross your legs. This [place] decrease the blood flow to your feet.  If you find a minor scrape,  cut, or break in the skin on your feet, keep it and the skin around it clean and dry. These areas Wirt be cleansed with mild soap and water. Do not cleanse the area with peroxide, alcohol, or iodine.  When you remove an adhesive bandage, be sure not to damage the skin around it.  If you have a wound, look at it several times a day to make sure it is healing.  Do not use heating pads or hot water bottles. They Farone burn your skin. If you have lost feeling in your feet or legs, you Herne not know it is happening until it is too late.  Make sure your health care provider performs a complete foot exam at least annually or more often if you have foot problems. Report any cuts, sores, or bruises to your health care provider immediately. SEEK MEDICAL CARE IF:   You have an injury that is not healing.  You have cuts or breaks in the skin.  You have an ingrown nail.  You notice redness on your legs or feet.  You feel burning or tingling in your legs or feet.  You have pain or cramps in your legs and feet.  Your legs or feet are numb.  Your feet always feel cold. SEEK IMMEDIATE MEDICAL CARE IF:   There is increasing redness,   swelling, or pain in or around a wound.  There is a red line that goes up your leg.  Pus is coming from a wound.  You develop a fever or as directed by your health care provider.  You notice a bad smell coming from an ulcer or wound. Document Released: 04/17/2000 Document Revised: 12/21/2012 Document Reviewed: 09/27/2012 ExitCare Patient Information 2015 [employer]. This information is not intended to replace advice given to you by your health care provider. Make sure you discuss any questions you have with your health care provider.  

## 2015-02-05 NOTE — Progress Notes (Signed)
Patient ID: [name], female   DOB: 1930-02-24, 81 y.o.   MRN: [id number]  Subjective: This patient presents for ongoing debridement of pre-ulcerative plantar keratoses at approximate 4-6 weeks intervals  Objective: Orientated 3 No open wounds noted bilaterally Bleeding callus plantar left first, bilaterally  Assessment: Pre-ulcerative calluses 2 Diabetic peripheral neuropathy  Plan: Debride pre-ulcerative calluses 2 without any bleeding  Reappoint 6 weeks

## 2015-02-05 NOTE — Telephone Encounter (Addendum)
Caregiver, Manuela Schwartz , called and would like to know if pt can be seen tomorrow at a different time. She would like and earlier appt in the day, just not an 8am appt. Please call and [phone]. Manuela Schwartz will be leaving in 20-30 min.

## 2015-02-05 NOTE — Telephone Encounter (Signed)
Amber from [facility] has called back

## 2015-02-06 ENCOUNTER — Encounter: Payer: Self-pay | Admitting: Neurology

## 2015-02-06 ENCOUNTER — Ambulatory Visit (INDEPENDENT_AMBULATORY_CARE_PROVIDER_SITE_OTHER): Payer: Medicare Other | Admitting: Neurology

## 2015-02-06 ENCOUNTER — Telehealth: Payer: Self-pay | Admitting: Neurology

## 2015-02-06 VITALS — BP 143/80 | HR 85 | Ht 67.0 in | Wt 136.0 lb

## 2015-02-06 DIAGNOSIS — I6522 Occlusion and stenosis of left carotid artery: Secondary | ICD-10-CM | POA: Diagnosis not present

## 2015-02-06 DIAGNOSIS — R569 Unspecified convulsions: Secondary | ICD-10-CM

## 2015-02-06 MED ORDER — PHENYTOIN 50 MG PO CHEW: 50.0000 mg | CHEWABLE_TABLET | Freq: Every day | ORAL | Status: DC

## 2015-02-06 NOTE — Telephone Encounter (Signed)
Spoke with [name]. Per Neuro, patient has not had any seizures since July. Patient [name] not qualify for any additional care. I sent the last office note just in case it might help support Dr. Donneta Romberg request for additional care.

## 2015-02-06 NOTE — Progress Notes (Signed)
Reason for visit: Seizures  [name] is an 81 y.o. female  History of present illness:  [name] is an 81 year old right-handed white female with a history of meningioma resection, encephalomalacia, and subsequent seizures. The patient has been placed on a multitude of anti-epileptic medications, she could not tolerate many of them. The patient has been placed on Dilantin, she seems to be doing fairly well with this. She recently was increased on the Dilantin from 200 mg daily to 300 mg daily with a blood level of 2.9. She felt poorly on this dose increase, feeling somewhat depressed and irritable. The patient indicates that she did not miss a dose, she was seen in the emergency room as she felt bad on 02/04/2015. The patient had a blood level of 3.0 with the Dilantin 2 weeks after she increased the medication. She indicates that she went home that evening, and suffered a seizure while asleep in bed, she woke up on the floor, she had bowel incontinence. The patient returns for an evaluation.  Past Medical History  Diagnosis Date  . Seasonal allergies   . HTN (hypertension)   . Osteoarthritis   . Palpitations   . Anemia   . History of colonic polyps   . GERD (gastroesophageal reflux disease)   . Hyperlipidemia   . Chronic facial pain   . Vitamin B12 deficiency   . Dyslipidemia   . Chronic foot pain   . Chronic cough   . Obesity   . Anxiety   . Complication of anesthesia     Sore jaw; could not chew or move mouth  . Diabetes mellitus     type 2 niddm x 20 years  . Metatarsal bone fracture 2014  . Hammer toe     bilateral  . Hx of radiation therapy 03/07/13- 03/29/13    right breast 4250 cGy 17 sessions  . Ejection fraction   . Skin cancer   . Melanoma ([place])   . Cancer of right breast ([place]) 12/26/12    right breast 12:00 o'clock, DCIS  . Convulsions/seizures ([place]) 10/16/2014    Past Surgical History  Procedure Laterality Date  . Colonoscopy    . Polypectomy      small  adenomatous  . Abdominal hysterectomy    . Cholecystectomy    . Hernia repair    . Knee arthroscopy Bilateral   . Ulnar tunnel release    . Eye surgery    . Cataract extraction w/ intraocular lens implant Right   . Bunionectomy    . Breast lumpectomy with needle localization Right 01/17/2013    Procedure: BREAST LUMPECTOMY WITH NEEDLE LOCALIZATION;  Surgeon: Edward Jolly, MD;  Location: [facility];  Service: General;  Laterality: Right;  . Breast biopsy Right 01/24/2013    Procedure: RE-EXCICION OF BREAST CANCER, ANTERIOR MARGINS;  Surgeon: Edward Jolly, MD;  Location: [facility];  Service: General;  Laterality: Right;  . Craniotomy Right 10/18/2013    Procedure: CRANIOTOMY TUMOR EXCISION;  Surgeon: Floyce Stakes, MD;  Location: [facility];  Service: Neurosurgery;  Laterality: Right;    Family History  Problem Relation Age of Onset  . Colon cancer    . Heart disease Mother   . Diabetes Father   . Pancreatic cancer Father   . Bone cancer Sister   . Prostate cancer Brother   . Colon cancer Brother   . Rectal cancer Sister   . Thyroid disease Sister     benign  goiter resected    Social history:  reports that she has never smoked. She has never used smokeless tobacco. She reports that she does not drink alcohol or use illicit drugs.    Allergies  Allergen Reactions  . Bystolic [Nebivolol Hcl] Other (See Comments)    "extreme weakness, heaviness in legs & arms, swelling in legs/arms/face, swollen abdomen, pain in bladder, feet pain, soreness in chest"  . Cholestyramine     "itching rash on stomach, bloated, nausea, vomiting, sleeplessness, extreme pain in arms"  . Hydrazine Yellow [Tartrazine] Other (See Comments)    "does not reduce high blood pressure, pain in arm, high pressure, felt like I was on verge of heart attack, really weak"  . Morphine Other (See Comments)    Feels morbid, weak, still in pain  . Niacin Palpitations    Fast heart beat  . Niaspan [Niacin Er]  Palpitations and Other (See Comments)    "fast heart beat, high blood pressure"  . Norvasc [Amlodipine Besylate] Other (See Comments)    "extreme fluid retention/pain)  . Optivar [Azelastine Hcl] Photosensitivity  . Sular [Nisoldipine Er] Other (See Comments)    "severe headaches, swelling eyes, hands, feet, shortness of breath, weak, flushed face, brain boiling, fluid retention, high blood sugar, nervous, heart fast beating"  . Tegretol [Carbamazepine] Other (See Comments)    Blood poisoning   . Telmisartan Other (See Comments)    "headache, difficulty urinating, high blood sugar, fluid retention"  . Cefdinir Swelling    Vaginal irritation, breathing,   . Clonidine Other (See Comments)    Dry mouth, fluid retention  . Clonidine Hydrochloride Other (See Comments)    Dry mouth, fluid retention  . Codeine Nausea And Vomiting  . Ezetimibe Other (See Comments)    Made weak  . Naproxen Other (See Comments)    Shrinks bladder  . Ziac [Bisoprolol-Hydrochlorothiazide] Other (See Comments)    "stopped urination"  . Elavil [Amitriptyline] Other (See Comments)    Gave Pt nightmares  . Keppra [Levetiracetam]     Shaking  . Lamictal [Lamotrigine]     itching  . Lyrica [Pregabalin] Swelling  . Topamax [Topiramate]     Dry eyes  . Ace Inhibitors Other (See Comments)    unknown  . Actonel [Risedronate Sodium] Other (See Comments)    unknown  . Amlodipine Besylate Other (See Comments)    unknown  . Aspirin Other (See Comments)    unknown  . Atacand [Candesartan] Other (See Comments)    unknown  . Bextra [Valdecoxib] Other (See Comments)    unknown  . Bisoprolol-Hydrochlorothiazide Other (See Comments)    unknown  . Candesartan Cilexetil Other (See Comments)    unknown  . Cefadroxil Other (See Comments)    unknown  . Celecoxib Rash  . Hydrocodone Other (See Comments)    unknown  . Hydrocodone-Acetaminophen Other (See Comments)    unknown  . Iodinated Diagnostic Agents Rash     "All over"  . Meloxicam Other (See Comments)    unknown  . Methylprednisolone Sodium Succinate Other (See Comments)    unknown  . Nabumetone Other (See Comments)    unknown  . Penicillins Other (See Comments)    unknown  . Pseudoephedrine-Guaifenesin Other (See Comments)    unknown  . Risedronate Sodium Other (See Comments)    unknown  . Rofecoxib Other (See Comments)    unknown  . Ru-Tuss [Chlorphen-Pse-Atrop-Hyos-Scop] Other (See Comments)    unknown  . Sulfonamide Derivatives Other (See Comments)  unknown  . Sulphur [Sulfur] Other (See Comments)    unknown  . Telithromycin Other (See Comments)    unknown  . Terfenadine Other (See Comments)    unknown  . Trandolapril-Verapamil Hcl Er Other (See Comments)    Headache, difficulty urinating, high blood sugar, fluid retention  . Valium [Diazepam] Other (See Comments)    Makes her mean and hyper    Medications:  Prior to Admission medications   Medication Sig Start Date End Date Taking? Authorizing Provider  acetaminophen (TYLENOL) 500 MG tablet Take 1,000 mg by mouth every 6 (six) hours as needed for pain.   Yes Historical Provider, MD  ALPRAZolam (XANAX) 0.25 MG tablet Take 1 tablet (0.25 mg total) by mouth at bedtime. 01/25/15  Yes Hoyt Koch, MD  calcium carbonate (OS-CAL) 600 MG TABS Take 600 mg by mouth daily.    Yes Historical Provider, MD  colestipol (COLESTID) 1 G tablet Take 2 g by mouth 2 (two) times daily.   Yes Historical Provider, MD  cyanocobalamin (,VITAMIN B-12,) 1000 MCG/ML injection Inject 1 mL (1,000 mcg total) into the muscle every 21 ( twenty-one) days. 01/25/15  Yes Hoyt Koch, MD  fexofenadine (ALLEGRA) 60 MG tablet Take 60 mg by mouth daily as needed for allergies.    Yes Historical Provider, MD  furosemide (LASIX) 20 MG tablet Take 1 tablet (20 mg total) by mouth daily. Do not take until I call you about labs. Patient taking differently: Take 20 mg by mouth daily as needed for fluid  or edema. Do not take until I call you about labs. 12/27/13  Yes Marin Olp, MD  lidocaine (LIDODERM) 5 % Place 1 patch onto the skin daily. Remove & Discard patch within 12 hours or as directed by MD Patient taking differently: Place 1 patch onto the skin daily as needed (pain). Remove & Discard patch within 12 hours or as directed by MD 01/25/15  Yes Hoyt Koch, MD  metFORMIN (GLUCOPHAGE-XR) 500 MG 24 hr tablet 4 tabs daily Patient taking differently: Take 500 mg by mouth 4 (four) times daily -  with meals and at bedtime.  04/09/14  Yes Renato Shin, MD  nateglinide (STARLIX) 120 MG tablet Take 1 tablet (120 mg total) by mouth 3 (three) times daily with meals. 07/17/14  Yes Renato Shin, MD  phenytoin (DILANTIN) 100 MG ER capsule Take 3 capsules (300 mg total) by mouth at bedtime. 01/17/15  Yes Kathrynn Ducking, MD  Polyethyl Glycol-Propyl Glycol 0.4-0.3 % SOLN Place 2 drops into both eyes at bedtime.    Yes Historical Provider, MD  ranitidine (ZANTAC) 150 MG capsule Take 150 mg by mouth daily as needed for heartburn.    Yes Historical Provider, MD  TARKA 2-180 MG per tablet TAKE 1 TABLET DAILY 02/06/14  Yes Carlena Bjornstad, MD  phenytoin (DILANTIN) 50 MG tablet Chew 1 tablet (50 mg total) by mouth daily. 02/06/15   Kathrynn Ducking, MD    ROS:  Out of a complete 14 system review of symptoms, the patient complains only of the following symptoms, and all other reviewed systems are negative.  Back pain  Itching  Seizures, weakness  Confusion, depression    Blood pressure 143/80, pulse 85, height 5\' 7"  (1.702 m), weight 136 lb (61.689 kg).  Physical Exam  General: The patient is alert and cooperative at the time of the examination.  Skin: No significant peripheral edema is noted.   Neurologic Exam  Mental status: The  patient is alert and oriented x 3 at the time of the examination. The patient has apparent normal recent and remote memory, with an apparently normal attention  span and concentration ability.   Cranial nerves: Facial symmetry is present. Speech is normal, no aphasia or dysarthria is noted. Extraocular movements are full. Visual fields are full.  Motor: The patient has good strength in all 4 extremities.  Sensory examination: Soft touch sensation is symmetric on the face, arms, and legs.  Coordination: The patient has good finger-nose-finger and heel-to-shin bilaterally.  Gait and station: The patient has a normal gait. Tandem gait is slightly unsteady. Romberg is negative. No drift is seen.  Reflexes: Deep tendon reflexes are symmetric.   Assessment/Plan:  1. History of seizures, recent recurrence   2. Meningioma resection   The patient apparently had a recent seizure event. The patient has essentially had no bump in the blood levels with a 100 mg daily increase in the Dilantin dosing. The patient indicates that she did not miss any doses and she was taking 3 capsules daily. I will go up on the dose taking 150 mg in the morning, 200 mg in the evening of Dilantin, recheck the blood level in 2 weeks. The patient is not operating motor vehicle. She has a revisit in January 2017.   Jill Alexanders MD 02/06/2015 6:03 PM  [facility] [address] [place],  [address]  Phone [phone] Fax [phone]

## 2015-02-06 NOTE — Patient Instructions (Addendum)
   We will increase the Dilantin to 150 mg in the morning and 200 mg in the evening.   Seizure, Adult A seizure is abnormal electrical activity in the brain. Seizures usually last from 30 seconds to 2 minutes. There are various types of seizures. Before a seizure, you Olesky have a warning sensation (aura) that a seizure is about to occur. An aura Waugh include the following symptoms:   Fear or anxiety.  Nausea.  Feeling like the room is spinning (vertigo).  Vision changes, such as seeing flashing lights or spots. Common symptoms during a seizure include:  A change in attention or behavior (altered mental status).  Convulsions with rhythmic jerking movements.  Drooling.  Rapid eye movements.  Grunting.  Loss of bladder and bowel control.  Bitter taste in the mouth.  Tongue biting. After a seizure, you Bouyer feel confused and sleepy. You Pfefferle also have an injury resulting from convulsions during the seizure. HOME CARE INSTRUCTIONS   If you are given medicines, take them exactly as prescribed by your health care provider.  Keep all follow-up appointments as directed by your health care provider.  Do not swim or drive or engage in risky activity during which a seizure could cause further injury to you or others until your health care provider says it is OK.  Get adequate rest.  Teach friends and family what to do if you have a seizure. They should:  Lay you on the ground to prevent a fall.  Put a cushion under your head.  Loosen any tight clothing around your neck.  Turn you on your side. If vomiting occurs, this helps keep your airway clear.  Stay with you until you recover.  Know whether or not you need emergency care. SEEK IMMEDIATE MEDICAL CARE IF:  The seizure lasts longer than 5 minutes.  The seizure is severe or you do not wake up immediately after the seizure.  You have an altered mental status after the seizure.  You are having more frequent or worsening  seizures. Someone should drive you to the emergency department or call local emergency services (911 in [place] MAKE SURE YOU:  Understand these instructions.  Will watch your condition.  Will get help right away if you are not doing well or get worse.   This information is not intended to replace advice given to you by your health care provider. Make sure you discuss any questions you have with your health care provider.   Document Released: 04/17/2000 Document Revised: 05/11/2014 Document Reviewed: 11/30/2012 Elsevier Interactive Patient Education [employer].

## 2015-02-06 NOTE — Telephone Encounter (Signed)
I have are a written a letter, this was submitted 10/16/2014. I will redo the letter.

## 2015-02-06 NOTE — Telephone Encounter (Signed)
Pt wanted to remind Dr. Jannifer Franklin to send letter to her insurance for additional assistance in her home.

## 2015-02-07 NOTE — Telephone Encounter (Signed)
[employer] called, wanted update on any recommendation about her home care. What is needed . Please call [phone], [name].

## 2015-02-07 NOTE — Telephone Encounter (Signed)
I called [name] and left a voicemail. I advised that Dr. Jannifer Franklin felt like the patient's home health should not be cut back and that she needs the same amount of care she was getting. I asked that she call back if she has any other questions.

## 2015-02-08 ENCOUNTER — Telehealth (HOSPITAL_COMMUNITY): Payer: Self-pay

## 2015-02-08 ENCOUNTER — Telehealth: Payer: Self-pay | Admitting: Internal Medicine

## 2015-02-08 NOTE — Telephone Encounter (Signed)
Will you please call patient and schedule a hospital follow up visit with Dr. Sharlet Salina?

## 2015-02-08 NOTE — Telephone Encounter (Signed)
Patient was in the ED, and states that she would like a recommendation for [facility]. I recommended a f/u, but she stated that she wanted to talk to you first.

## 2015-02-08 NOTE — Telephone Encounter (Signed)
Patient returned your call.

## 2015-02-08 NOTE — Telephone Encounter (Signed)
Left message for patient to call back  

## 2015-02-08 NOTE — Telephone Encounter (Signed)
Patient calling to request copy of discharge papers.  Discharge papers mailed to [facility] address.

## 2015-02-11 DIAGNOSIS — M47816 Spondylosis without myelopathy or radiculopathy, lumbar region: Secondary | ICD-10-CM | POA: Diagnosis not present

## 2015-02-11 DIAGNOSIS — M9903 Segmental and somatic dysfunction of lumbar region: Secondary | ICD-10-CM | POA: Diagnosis not present

## 2015-02-12 DIAGNOSIS — M9903 Segmental and somatic dysfunction of lumbar region: Secondary | ICD-10-CM | POA: Diagnosis not present

## 2015-02-12 DIAGNOSIS — M47816 Spondylosis without myelopathy or radiculopathy, lumbar region: Secondary | ICD-10-CM | POA: Diagnosis not present

## 2015-02-13 ENCOUNTER — Other Ambulatory Visit: Payer: Self-pay | Admitting: General Surgery

## 2015-02-13 DIAGNOSIS — R921 Mammographic calcification found on diagnostic imaging of breast: Secondary | ICD-10-CM

## 2015-02-13 NOTE — Telephone Encounter (Signed)
I called the patient. She stated she talked to the insurance company and they stated Dr. Jannifer Franklin' letter specified that she only needs a few hours each day of in home care. I verified that he recommended they not cut her hours from what they were previously. She stated her PCP had wrote a letter on her behalf advising that she needs 24 hour care. She would like to know if Dr. Jannifer Franklin would write a similar letter? She is scared to be alone d/t her seizures.

## 2015-02-13 NOTE — Telephone Encounter (Signed)
I called patient. The patient likely does not require 24/7 supervision. The letter was to maintain the level of care that she was currently receiving. The patient wants to have a copy of that her, we will send her this.

## 2015-02-13 NOTE — Telephone Encounter (Signed)
Patient called, would like to speak with [facility] regarding letter to insurance company/long term care plan. Please call patient [phone]. Insurance company is giving conflicting information.

## 2015-02-14 DIAGNOSIS — M9903 Segmental and somatic dysfunction of lumbar region: Secondary | ICD-10-CM | POA: Diagnosis not present

## 2015-02-14 DIAGNOSIS — M47816 Spondylosis without myelopathy or radiculopathy, lumbar region: Secondary | ICD-10-CM | POA: Diagnosis not present

## 2015-02-18 DIAGNOSIS — M9903 Segmental and somatic dysfunction of lumbar region: Secondary | ICD-10-CM | POA: Diagnosis not present

## 2015-02-18 DIAGNOSIS — M47816 Spondylosis without myelopathy or radiculopathy, lumbar region: Secondary | ICD-10-CM | POA: Diagnosis not present

## 2015-02-20 ENCOUNTER — Telehealth: Payer: Self-pay | Admitting: Neurology

## 2015-02-20 ENCOUNTER — Other Ambulatory Visit (INDEPENDENT_AMBULATORY_CARE_PROVIDER_SITE_OTHER): Payer: Self-pay

## 2015-02-20 DIAGNOSIS — Z5181 Encounter for therapeutic drug level monitoring: Secondary | ICD-10-CM

## 2015-02-20 DIAGNOSIS — Z0289 Encounter for other administrative examinations: Secondary | ICD-10-CM

## 2015-02-20 NOTE — Telephone Encounter (Signed)
I called patient. She is to come in to recheck the Dilantin level as we recently increased the dosing.

## 2015-02-21 ENCOUNTER — Telehealth: Payer: Self-pay | Admitting: Neurology

## 2015-02-21 DIAGNOSIS — M9903 Segmental and somatic dysfunction of lumbar region: Secondary | ICD-10-CM | POA: Diagnosis not present

## 2015-02-21 DIAGNOSIS — M47816 Spondylosis without myelopathy or radiculopathy, lumbar region: Secondary | ICD-10-CM | POA: Diagnosis not present

## 2015-02-21 LAB — PHENYTOIN LEVEL, TOTAL: Phenytoin (Dilantin), Serum: 8.4 ug/mL — ABNORMAL LOW (ref 10.0–20.0)

## 2015-02-21 NOTE — Telephone Encounter (Signed)
I called patient. The Dilantin levels 8.4. She in the past has had a lot of problems tolerating medications, she has not had any further seizures. She indicates that she feels that the Dilantin makes her somewhat nervous at times. We will keep the dosing at 50 mg in the morning and 300 mg in the evening, but if she believes that she has had another seizure episode, we will go up to 100 mg in the morning and 300 mg in the evening. The patient will let me know if she is having problems.

## 2015-02-25 DIAGNOSIS — M47816 Spondylosis without myelopathy or radiculopathy, lumbar region: Secondary | ICD-10-CM | POA: Diagnosis not present

## 2015-02-25 DIAGNOSIS — M9903 Segmental and somatic dysfunction of lumbar region: Secondary | ICD-10-CM | POA: Diagnosis not present

## 2015-02-28 DIAGNOSIS — M9903 Segmental and somatic dysfunction of lumbar region: Secondary | ICD-10-CM | POA: Diagnosis not present

## 2015-02-28 DIAGNOSIS — M47816 Spondylosis without myelopathy or radiculopathy, lumbar region: Secondary | ICD-10-CM | POA: Diagnosis not present

## 2015-03-01 ENCOUNTER — Ambulatory Visit (INDEPENDENT_AMBULATORY_CARE_PROVIDER_SITE_OTHER): Payer: Medicare Other | Admitting: Endocrinology

## 2015-03-01 ENCOUNTER — Encounter: Payer: Self-pay | Admitting: Endocrinology

## 2015-03-01 VITALS — BP 134/84 | HR 65 | Temp 97.8°F | Ht 67.5 in | Wt 135.0 lb

## 2015-03-01 DIAGNOSIS — E1151 Type 2 diabetes mellitus with diabetic peripheral angiopathy without gangrene: Secondary | ICD-10-CM

## 2015-03-01 DIAGNOSIS — I6522 Occlusion and stenosis of left carotid artery: Secondary | ICD-10-CM | POA: Diagnosis not present

## 2015-03-01 LAB — POCT GLYCOSYLATED HEMOGLOBIN (HGB A1C): Hemoglobin A1C: 6.6

## 2015-03-01 MED ORDER — STARLIX 120 MG PO TABS: 120.0000 mg | ORAL_TABLET | Freq: Three times a day (TID) | ORAL | Status: DC

## 2015-03-01 NOTE — Progress Notes (Signed)
Subjective:    Patient ID: [name], female    DOB: 09/02/1929, 81 y.o.   MRN: [id number]  HPI Pt returns for f/u of multinodular goiter (after a syncopal episode in April of 2015, she was eval with carotid US, and there was incidentally noted to have nodules in the thyroid; she is euthyroid; bx was not done, due to advanced age and FHx of benign goiter; [place] in late 2015 was not significantly changed). She does not notice the nodules.  Pt returns for f/u of diabetes mellitus:  DM type: 2 Dx'ed: 6962 Complications: polyneuropathy and PAD.   Therapy: 2 oral meds DKA: never Severe hypoglycemia: never.   Pancreatitis: never.   Other: she took insulin for only a brief time after dx; she declines [place]; pt says she wants brand-name starlix, as the tab resembles another med.   Interval history:  She has lost weight. she brings a record of her cbg's which i have reviewed today.  It varies from 153-290.  There is no trend throughout the day.  Past Medical History  Diagnosis Date  . Seasonal allergies   . HTN (hypertension)   . Osteoarthritis   . Palpitations   . Anemia   . History of colonic polyps   . GERD (gastroesophageal reflux disease)   . Hyperlipidemia   . Chronic facial pain   . Vitamin B12 deficiency   . Dyslipidemia   . Chronic foot pain   . Chronic cough   . Obesity   . Anxiety   . Complication of anesthesia     Sore jaw; could not chew or move mouth  . Diabetes mellitus     type 2 niddm x 20 years  . Metatarsal bone fracture 2014  . Hammer toe     bilateral  . Hx of radiation therapy 03/07/13- 03/29/13    right breast 4250 cGy 17 sessions  . Ejection fraction   . Skin cancer   . Melanoma ([place])   . Cancer of right breast ([place]) 12/26/12    right breast 12:00 o'clock, DCIS  . Convulsions/seizures ([place]) 10/16/2014    Past Surgical History  Procedure Laterality Date  . Colonoscopy    . Polypectomy      small adenomatous  . Abdominal hysterectomy    .  Cholecystectomy    . Hernia repair    . Knee arthroscopy Bilateral   . Ulnar tunnel release    . Eye surgery    . Cataract extraction w/ intraocular lens implant Right   . Bunionectomy    . Breast lumpectomy with needle localization Right 01/17/2013    Procedure: BREAST LUMPECTOMY WITH NEEDLE LOCALIZATION;  Surgeon: Edward Jolly, MD;  Location: [facility];  Service: General;  Laterality: Right;  . Breast biopsy Right 01/24/2013    Procedure: RE-EXCICION OF BREAST CANCER, ANTERIOR MARGINS;  Surgeon: Edward Jolly, MD;  Location: [facility];  Service: General;  Laterality: Right;  . Craniotomy Right 10/18/2013    Procedure: CRANIOTOMY TUMOR EXCISION;  Surgeon: Floyce Stakes, MD;  Location: [facility];  Service: Neurosurgery;  Laterality: Right;    Social History   Social History  . Marital Status: Widowed    Spouse Name: N/A  . Number of Children: 0  . Years of Education: college   Occupational History  . retired    Social History Main Topics  . Smoking status: Never Smoker   . Smokeless tobacco: Never Used  . Alcohol Use: No  .  Drug Use: No  . Sexual Activity: Yes     Comment: menarche age 45, hysterectomy age 54, HRT x 2-3 mos, G1- miscarriage   Other Topics Concern  . Not on file   Social History Narrative   Widowed, lives alone.  Ambulates independently.    Drinks decaf only   Patient is right handed.    Current Outpatient Prescriptions on File Prior to Visit  Medication Sig Dispense Refill  . acetaminophen (TYLENOL) 500 MG tablet Take 1,000 mg by mouth every 6 (six) hours as needed for pain.    Marland Kitchen ALPRAZolam (XANAX) 0.25 MG tablet Take 1 tablet (0.25 mg total) by mouth at bedtime. 30 tablet 0  . calcium carbonate (OS-CAL) 600 MG TABS Take 600 mg by mouth daily.     . colestipol (COLESTID) 1 G tablet Take 2 g by mouth 2 (two) times daily.    . cyanocobalamin (,VITAMIN B-12,) 1000 MCG/ML injection Inject 1 mL (1,000 mcg total) into the muscle every 21 (  twenty-one) days. 3 mL 3  . fexofenadine (ALLEGRA) 60 MG tablet Take 60 mg by mouth daily as needed for allergies.     . furosemide (LASIX) 20 MG tablet Take 1 tablet (20 mg total) by mouth daily. Do not take until I call you about labs. (Patient taking differently: Take 20 mg by mouth daily as needed for fluid or edema. Do not take until I call you about labs.) 30 tablet 0  . lidocaine (LIDODERM) 5 % Place 1 patch onto the skin daily. Remove & Discard patch within 12 hours or as directed by MD (Patient taking differently: Place 1 patch onto the skin daily as needed (pain). Remove & Discard patch within 12 hours or as directed by MD) 90 patch 1  . metFORMIN (GLUCOPHAGE-XR) 500 MG 24 hr tablet 4 tabs daily (Patient taking differently: Take 500 mg by mouth 4 (four) times daily -  with meals and at bedtime. ) 360 tablet 3  . phenytoin (DILANTIN) 100 MG ER capsule Take 3 capsules (300 mg total) by mouth at bedtime. (Patient taking differently: Take 300 mg by mouth. 1 1/2 in the morning and 2 in the evening.) 270 capsule 1  . phenytoin (DILANTIN) 50 MG tablet Chew 1 tablet (50 mg total) by mouth daily. 30 tablet 2  . Polyethyl Glycol-Propyl Glycol 0.4-0.3 % SOLN Place 2 drops into both eyes at bedtime.     . ranitidine (ZANTAC) 150 MG capsule Take 150 mg by mouth daily as needed for heartburn.     Marland Kitchen TARKA 2-180 MG per tablet TAKE 1 TABLET DAILY 90 tablet 1   No current facility-administered medications on file prior to visit.    Allergies  Allergen Reactions  . Bystolic [Nebivolol Hcl] Other (See Comments)    "extreme weakness, heaviness in legs & arms, swelling in legs/arms/face, swollen abdomen, pain in bladder, feet pain, soreness in chest"  . Cholestyramine     "itching rash on stomach, bloated, nausea, vomiting, sleeplessness, extreme pain in arms"  . Hydrazine Yellow [Tartrazine] Other (See Comments)    "does not reduce high blood pressure, pain in arm, high pressure, felt like I was on verge  of heart attack, really weak"  . Morphine Other (See Comments)    Feels morbid, weak, still in pain  . Niacin Palpitations    Fast heart beat  . Niaspan [Niacin Er] Palpitations and Other (See Comments)    "fast heart beat, high blood pressure"  . Norvasc [Amlodipine  Besylate] Other (See Comments)    "extreme fluid retention/pain)  . Optivar [Azelastine Hcl] Photosensitivity  . Sular [Nisoldipine Er] Other (See Comments)    "severe headaches, swelling eyes, hands, feet, shortness of breath, weak, flushed face, brain boiling, fluid retention, high blood sugar, nervous, heart fast beating"  . Tegretol [Carbamazepine] Other (See Comments)    Blood poisoning   . Telmisartan Other (See Comments)    "headache, difficulty urinating, high blood sugar, fluid retention"  . Cefdinir Swelling    Vaginal irritation, breathing,   . Clonidine Other (See Comments)    Dry mouth, fluid retention  . Clonidine Hydrochloride Other (See Comments)    Dry mouth, fluid retention  . Codeine Nausea And Vomiting  . Ezetimibe Other (See Comments)    Made weak  . Naproxen Other (See Comments)    Shrinks bladder  . Ziac [Bisoprolol-Hydrochlorothiazide] Other (See Comments)    "stopped urination"  . Elavil [Amitriptyline] Other (See Comments)    Gave Pt nightmares  . Keppra [Levetiracetam]     Shaking  . Lamictal [Lamotrigine]     itching  . Lyrica [Pregabalin] Swelling  . Topamax [Topiramate]     Dry eyes  . Ace Inhibitors Other (See Comments)    unknown  . Actonel [Risedronate Sodium] Other (See Comments)    unknown  . Amlodipine Besylate Other (See Comments)    unknown  . Aspirin Other (See Comments)    unknown  . Atacand [Candesartan] Other (See Comments)    unknown  . Bextra [Valdecoxib] Other (See Comments)    unknown  . Bisoprolol-Hydrochlorothiazide Other (See Comments)    unknown  . Candesartan Cilexetil Other (See Comments)    unknown  . Cefadroxil Other (See Comments)    unknown    . Celecoxib Rash  . Hydrocodone Other (See Comments)    unknown  . Hydrocodone-Acetaminophen Other (See Comments)    unknown  . Iodinated Diagnostic Agents Rash    "All over"  . Meloxicam Other (See Comments)    unknown  . Methylprednisolone Sodium Succinate Other (See Comments)    unknown  . Nabumetone Other (See Comments)    unknown  . Penicillins Other (See Comments)    unknown  . Pseudoephedrine-Guaifenesin Other (See Comments)    unknown  . Risedronate Sodium Other (See Comments)    unknown  . Rofecoxib Other (See Comments)    unknown  . Ru-Tuss [Chlorphen-Pse-Atrop-Hyos-Scop] Other (See Comments)    unknown  . Sulfonamide Derivatives Other (See Comments)    unknown  . Sulphur [Sulfur] Other (See Comments)    unknown  . Telithromycin Other (See Comments)    unknown  . Terfenadine Other (See Comments)    unknown  . Trandolapril-Verapamil Hcl Er Other (See Comments)    Headache, difficulty urinating, high blood sugar, fluid retention  . Valium [Diazepam] Other (See Comments)    Makes her mean and hyper    Family History  Problem Relation Age of Onset  . Colon cancer    . Heart disease Mother   . Diabetes Father   . Pancreatic cancer Father   . Bone cancer Sister   . Prostate cancer Brother   . Colon cancer Brother   . Rectal cancer Sister   . Thyroid disease Sister     benign goiter resected    BP 134/84 mmHg  Pulse 65  Temp(Src) 97.8 F (36.6 C) (Oral)  Ht 5' 7.5" (1.715 m)  Wt 135 lb (61.236 kg)  BMI 20.82 kg/m2  SpO2 99%    Review of Systems She denies hypoglycemia.      Objective:   Physical Exam VITAL SIGNS:  See vs page GENERAL: no distress Pulses: dorsalis pedis intact bilat.   MSK: no deformity of the feet, except for hammer toes.   CV: no leg edema Skin:  no ulcer on the feet.  normal color and temp on the feet. Neuro: sensation is intact to touch on the feet.    A1c=6.6%    Assessment & Plan:  DM: well-controlled  Patient  is advised the following: Patient Instructions  Please continue the same medications.   Please come back for a follow-up appointment in 4-6 months.   check your blood sugar once a day.  vary the time of day when you check, between before the 3 meals, and at bedtime.  also check if you have symptoms of your blood sugar being too high or too low.  please keep a record of the readings and bring it to your next appointment here (or you can bring the meter itself).  You can write it on any piece of paper.  please call us sooner if your blood sugar goes below 70, or if you have a lot of readings over 200.

## 2015-03-01 NOTE — Patient Instructions (Addendum)
Please continue the same medications. Please come back for a follow-up appointment in 4-6 months  check your blood sugar once a day.  vary the time of day when you check, between before the 3 meals, and at bedtime.  also check if you have symptoms of your blood sugar being too high or too low.  please keep a record of the readings and bring it to your next appointment here (or you can bring the meter itself).  You can write it on any piece of paper.  please call us sooner if your blood sugar goes below 70, or if you have a lot of readings over 200.   

## 2015-03-04 ENCOUNTER — Other Ambulatory Visit: Payer: Self-pay

## 2015-03-04 DIAGNOSIS — M47816 Spondylosis without myelopathy or radiculopathy, lumbar region: Secondary | ICD-10-CM | POA: Diagnosis not present

## 2015-03-04 DIAGNOSIS — M9903 Segmental and somatic dysfunction of lumbar region: Secondary | ICD-10-CM | POA: Diagnosis not present

## 2015-03-04 MED ORDER — PHENYTOIN SODIUM EXTENDED 100 MG PO CAPS: 300.0000 mg | ORAL_CAPSULE | Freq: Every day | ORAL | Status: DC

## 2015-03-04 NOTE — Telephone Encounter (Signed)
Rx has been sent to [facility], Receipt confirmed by pharmacy.

## 2015-03-04 NOTE — Telephone Encounter (Signed)
Patient called to request refill of phenytoin (DILANTIN) 100 MG ER capsule. Patient requests that Rx be sent to [facility].

## 2015-03-05 ENCOUNTER — Other Ambulatory Visit: Payer: Self-pay

## 2015-03-05 ENCOUNTER — Telehealth: Payer: Self-pay | Admitting: *Deleted

## 2015-03-05 ENCOUNTER — Telehealth: Payer: Self-pay | Admitting: Endocrinology

## 2015-03-05 MED ORDER — PHENYTOIN 50 MG PO CHEW: 50.0000 mg | CHEWABLE_TABLET | Freq: Every day | ORAL | Status: DC

## 2015-03-05 MED ORDER — ALPRAZOLAM 0.25 MG PO TABS: 0.2500 mg | ORAL_TABLET | Freq: Every day | ORAL | Status: DC

## 2015-03-05 NOTE — Telephone Encounter (Signed)
50mg  Rx sent to [facility].  Receipt confirmed by pharmacy.

## 2015-03-05 NOTE — Telephone Encounter (Signed)
Receive call pt states she is [facility] Alprazolam 0.25 mg 90 day supply...[name]

## 2015-03-05 NOTE — Telephone Encounter (Signed)
Patient called back. Patient needs refills of phenytoin (DILANTIN) 50 MG tablet not 100mg . Patient has 5 left of 50mg .

## 2015-03-05 NOTE — Telephone Encounter (Signed)
I contacted the pt. She stated the problem she has is her blood pressure medication and the starlix look the exact same. Patient stated she cannot tell the difference and is worried she will take the wrong medication.

## 2015-03-05 NOTE — Telephone Encounter (Signed)
Called pt gave her md response pt states she doesn't want 30 day. Its much cheaper if md write for 90 days. She states she will have to find another md that would rx 90 day. Shredded rx that md sign...Johny Chess

## 2015-03-05 NOTE — Telephone Encounter (Signed)
We do not fill 90 days of xanax but can send in 30 day supply. Please fax.

## 2015-03-05 NOTE — Telephone Encounter (Signed)
He [facility] rx needs to say dispence as written and will be needing a PA

## 2015-03-05 NOTE — Telephone Encounter (Signed)
I contacted the pt and advised we are working on getting her medication approved.

## 2015-03-05 NOTE — Telephone Encounter (Signed)
Ok, i'll do PA 

## 2015-03-05 NOTE — Telephone Encounter (Signed)
please call patient: i received prior auth for starlix They want to know what problem you had with generic. Please let me know

## 2015-03-11 DIAGNOSIS — M47816 Spondylosis without myelopathy or radiculopathy, lumbar region: Secondary | ICD-10-CM | POA: Diagnosis not present

## 2015-03-11 DIAGNOSIS — H04123 Dry eye syndrome of bilateral lacrimal glands: Secondary | ICD-10-CM | POA: Diagnosis not present

## 2015-03-11 DIAGNOSIS — M9903 Segmental and somatic dysfunction of lumbar region: Secondary | ICD-10-CM | POA: Diagnosis not present

## 2015-03-12 ENCOUNTER — Telehealth: Payer: Self-pay

## 2015-03-12 DIAGNOSIS — R195 Other fecal abnormalities: Secondary | ICD-10-CM

## 2015-03-12 NOTE — Telephone Encounter (Signed)
Pt came up front and requested for a stool card. Sx of dark stool after starting Phenytoin.  PCP advise okay to place order for same.  Pt advise.  Order placed.   If positive, pt will need an appt to evaluate further.

## 2015-03-19 ENCOUNTER — Other Ambulatory Visit: Payer: Medicare Other

## 2015-03-19 ENCOUNTER — Other Ambulatory Visit: Payer: Self-pay

## 2015-03-19 ENCOUNTER — Other Ambulatory Visit: Payer: Self-pay | Admitting: Internal Medicine

## 2015-03-19 ENCOUNTER — Other Ambulatory Visit (INDEPENDENT_AMBULATORY_CARE_PROVIDER_SITE_OTHER): Payer: Medicare Other

## 2015-03-19 ENCOUNTER — Encounter: Payer: Self-pay | Admitting: Podiatry

## 2015-03-19 ENCOUNTER — Ambulatory Visit (INDEPENDENT_AMBULATORY_CARE_PROVIDER_SITE_OTHER): Payer: Medicare Other | Admitting: Podiatry

## 2015-03-19 DIAGNOSIS — R195 Other fecal abnormalities: Secondary | ICD-10-CM

## 2015-03-19 DIAGNOSIS — L84 Corns and callosities: Secondary | ICD-10-CM

## 2015-03-19 DIAGNOSIS — E1142 Type 2 diabetes mellitus with diabetic polyneuropathy: Secondary | ICD-10-CM

## 2015-03-19 DIAGNOSIS — M47816 Spondylosis without myelopathy or radiculopathy, lumbar region: Secondary | ICD-10-CM | POA: Diagnosis not present

## 2015-03-19 DIAGNOSIS — R300 Dysuria: Secondary | ICD-10-CM

## 2015-03-19 DIAGNOSIS — M9903 Segmental and somatic dysfunction of lumbar region: Secondary | ICD-10-CM | POA: Diagnosis not present

## 2015-03-19 LAB — HEMOCCULT SLIDES (X 3 CARDS)
Fecal Occult Blood: NEGATIVE
OCCULT 1: NEGATIVE
OCCULT 2: NEGATIVE
OCCULT 3: NEGATIVE
OCCULT 4: NEGATIVE
OCCULT 5: NEGATIVE

## 2015-03-19 MED ORDER — NATEGLINIDE 120 MG PO TABS: 120.0000 mg | ORAL_TABLET | Freq: Three times a day (TID) | ORAL | Status: DC

## 2015-03-19 NOTE — Progress Notes (Signed)
Patient ID: [name], female   DOB: Jan 19, 1930, 81 y.o.   MRN: [id number]  Subjective: This patient presents at six-week intervals for debridement of pre-ulcerative plantar keratoses on the right and left feet  Objective: Bleeding callus sub-first MPJ bilaterally No open wounds bilaterally  Assessment: Pre-ulcerative plantar callus 2 Diabetic peripheral neuropathy  Plan: Debridement of pre-ulcerative calluses 2 without any bleeding Wear diabetic shoes with custom insoles  Reappoint 6 weeks

## 2015-03-19 NOTE — Patient Instructions (Signed)
Diabetes and Foot Care Diabetes Doody cause you to have problems because of poor blood supply (circulation) to your feet and legs. This Keeven cause the skin on your feet to become thinner, break easier, and heal more slowly. Your skin Fleek become dry, and the skin Lundeen peel and crack. You Battin also have nerve damage in your legs and feet causing decreased feeling in them. You Shimmel not notice minor injuries to your feet that could lead to infections or more serious problems. Taking care of your feet is one of the most important things you can do for yourself.  HOME CARE INSTRUCTIONS  Wear shoes at all times, even in the house. Do not go barefoot. Bare feet are easily injured.  Check your feet daily for blisters, cuts, and redness. If you cannot see the bottom of your feet, use a mirror or ask someone for help.  Wash your feet with warm water (do not use hot water) and mild soap. Then pat your feet and the areas between your toes until they are completely dry. Do not soak your feet as this can dry your skin.  Apply a moisturizing lotion or petroleum jelly (that does not contain alcohol and is unscented) to the skin on your feet and to dry, brittle toenails. Do not apply lotion between your toes.  Trim your toenails straight across. Do not dig under them or around the cuticle. File the edges of your nails with an emery board or nail file.  Do not cut corns or calluses or try to remove them with medicine.  Wear clean socks or stockings every day. Make sure they are not too tight. Do not wear knee-high stockings since they Wann decrease blood flow to your legs.  Wear shoes that fit properly and have enough cushioning. To break in new shoes, wear them for just a few hours a day. This prevents you from injuring your feet. Always look in your shoes before you put them on to be sure there are no objects inside.  Do not cross your legs. This Kobrin decrease the blood flow to your feet.  If you find a minor scrape,  cut, or break in the skin on your feet, keep it and the skin around it clean and dry. These areas Giangregorio be cleansed with mild soap and water. Do not cleanse the area with peroxide, alcohol, or iodine.  When you remove an adhesive bandage, be sure not to damage the skin around it.  If you have a wound, look at it several times a day to make sure it is healing.  Do not use heating pads or hot water bottles. They Mauro burn your skin. If you have lost feeling in your feet or legs, you Borah not know it is happening until it is too late.  Make sure your health care provider performs a complete foot exam at least annually or more often if you have foot problems. Report any cuts, sores, or bruises to your health care provider immediately. SEEK MEDICAL CARE IF:   You have an injury that is not healing.  You have cuts or breaks in the skin.  You have an ingrown nail.  You notice redness on your legs or feet.  You feel burning or tingling in your legs or feet.  You have pain or cramps in your legs and feet.  Your legs or feet are numb.  Your feet always feel cold. SEEK IMMEDIATE MEDICAL CARE IF:   There is increasing redness,   swelling, or pain in or around a wound.  There is a red line that goes up your leg.  Pus is coming from a wound.  You develop a fever or as directed by your health care provider.  You notice a bad smell coming from an ulcer or wound.   This information is not intended to replace advice given to you by your health care provider. Make sure you discuss any questions you have with your health care provider.   Document Released: 04/17/2000 Document Revised: 12/21/2012 Document Reviewed: 09/27/2012 Elsevier Interactive Patient Education 2016 [employer] Inc[employer]  

## 2015-03-20 ENCOUNTER — Telehealth: Payer: Self-pay | Admitting: Internal Medicine

## 2015-03-20 MED ORDER — FOSFOMYCIN TROMETHAMINE 3 G PO PACK: 3.0000 g | PACK | Freq: Once | ORAL | Status: DC

## 2015-03-20 NOTE — Telephone Encounter (Signed)
PLEASE NOTE: All timestamps contained within this report are represented as [place] Standard Time. CONFIDENTIALTY NOTICE: This fax transmission is intended only for the addressee. It contains information that is legally privileged, confidential or otherwise protected from use or disclosure. If you are not the intended recipient, you are strictly prohibited from reviewing, disclosing, copying using or disseminating any of this information or taking any action in reliance on or regarding this information. If you have received this fax in error, please notify us immediately by telephone so that we can arrange for its return to [place]. Phone: [phone], Toll-Free: [phone], Fax: [phone] Page: 1 of 1 Call Id: [id number] [facility] - Client [place] Patient Name: [name] DOB: 14-Feb-1930 Initial Comment Caller states she believes she has a UTI. She has burning and pain and nauseous. Nurse Assessment Nurse: Marcelline Deist, RN, Lynda Date/Time (Eastern Time): 03/20/2015 12:08:40 PM Confirm and document reason for call. If symptomatic, describe symptoms. ---Caller states she believes she has a UTI. She has burning and pain and nauseous. Is taking an OTC AZO product. She would like an antibiotic called in. Symptoms started yesterday. Left a specimen yesterday afternoon, hasn't heard back. Uses [facility] [phone]. Has multiple medication allergies. Has the patient traveled out of the country within the last 30 days? ---Not Applicable Does the patient have any new or worsening symptoms? ---Yes Will a triage be completed? ---Yes Related visit to physician within the last 2 weeks? ---No Does the PT have any chronic conditions? (i.e. diabetes, asthma, etc.) ---Yes List chronic conditions. ---diabetes, seizures d/t brain tumor which was removed, takes BP rx, cholesterol rx Guidelines Guideline Title Affirmed Question  Affirmed Notes Urination Pain - Female Diabetes mellitus or weak immune system (e.g., HIV positive, cancer chemotherapy, transplant patient) Final Disposition User See Physician within 4 Hours (or PCP triage) Marcelline Deist, RN, Lynda Comments Caller states she would prefer to know what the urinalysis results are and have rx called in rather than coming back to office. She is having to urinate too frequently, almost incontinent at times. Please contact her with results from urine specimen and whether they can call a rx into her pharmacy. Referrals REFERRED TO PCP OFFICE Disagree/Comply: Comply

## 2015-03-20 NOTE — Telephone Encounter (Signed)
States would like to know lab results before caregiver leaves today at 1pm in order to pick up script if needed.

## 2015-03-20 NOTE — Telephone Encounter (Signed)
Fosfomycin sent to pharmacy

## 2015-03-21 ENCOUNTER — Other Ambulatory Visit: Payer: Self-pay

## 2015-03-21 ENCOUNTER — Other Ambulatory Visit: Payer: Self-pay | Admitting: General Surgery

## 2015-03-21 DIAGNOSIS — R921 Mammographic calcification found on diagnostic imaging of breast: Secondary | ICD-10-CM

## 2015-03-21 NOTE — Telephone Encounter (Signed)
Called pt and left a message about rx of abx at pharm. Offered our assistance if there were any questions.

## 2015-03-25 ENCOUNTER — Other Ambulatory Visit (HOSPITAL_COMMUNITY)
Admission: RE | Admit: 2015-03-25 | Discharge: 2015-03-25 | Disposition: A | Discharge status: Home / Self Care | Payer: Medicare Other | Source: Ambulatory Visit | Attending: Family Medicine | Admitting: Family Medicine

## 2015-03-25 ENCOUNTER — Emergency Department (INDEPENDENT_AMBULATORY_CARE_PROVIDER_SITE_OTHER)
Admission: EM | Admit: 2015-03-25 | Discharge: 2015-03-25 | Disposition: A | Discharge status: Home / Self Care | Payer: Medicare Other | Source: Home / Self Care | Attending: Family Medicine | Admitting: Family Medicine

## 2015-03-25 ENCOUNTER — Ambulatory Visit
Admission: RE | Admit: 2015-03-25 | Discharge: 2015-03-25 | Disposition: A | Discharge status: Home / Self Care | Payer: Medicare Other | Source: Ambulatory Visit | Attending: General Surgery | Admitting: General Surgery

## 2015-03-25 ENCOUNTER — Other Ambulatory Visit: Payer: Self-pay | Admitting: General Surgery

## 2015-03-25 ENCOUNTER — Encounter (HOSPITAL_COMMUNITY): Payer: Self-pay | Admitting: Emergency Medicine

## 2015-03-25 ENCOUNTER — Other Ambulatory Visit (HOSPITAL_COMMUNITY): Admission: AD | Admit: 2015-03-25 | Payer: Self-pay | Source: Ambulatory Visit | Admitting: Family Medicine

## 2015-03-25 DIAGNOSIS — N644 Mastodynia: Secondary | ICD-10-CM

## 2015-03-25 DIAGNOSIS — N390 Urinary tract infection, site not specified: Secondary | ICD-10-CM | POA: Insufficient documentation

## 2015-03-25 DIAGNOSIS — R921 Mammographic calcification found on diagnostic imaging of breast: Secondary | ICD-10-CM

## 2015-03-25 DIAGNOSIS — N6489 Other specified disorders of breast: Secondary | ICD-10-CM | POA: Diagnosis not present

## 2015-03-25 LAB — URINALYSIS W MICROSCOPIC + REFLEX CULTURE: Yeast: NONE SEEN [HPF]

## 2015-03-25 LAB — POCT URINALYSIS DIP (DEVICE)
Bilirubin Urine: NEGATIVE
Glucose, UA: 100 mg/dL — AB
Nitrite: POSITIVE — AB
Protein, ur: 100 mg/dL — AB
Specific Gravity, Urine: 1.015 (ref 1.005–1.030)
Urobilinogen, UA: 1 mg/dL (ref 0.0–1.0)
pH: 5 (ref 5.0–8.0)

## 2015-03-25 MED ORDER — ONDANSETRON HCL 4 MG PO TABS: 4.0000 mg | ORAL_TABLET | Freq: Four times a day (QID) | ORAL | Status: DC

## 2015-03-25 MED ORDER — CIPROFLOXACIN HCL 250 MG PO TABS: 250.0000 mg | ORAL_TABLET | Freq: Two times a day (BID) | ORAL | Status: DC

## 2015-03-25 NOTE — Discharge Instructions (Signed)
Antibiotic Medicine °Antibiotic medicines are used to treat infections caused by bacteria. They work by hurting or killing the germs that are making you sick. °HOW WILL MY MEDICINE BE PICKED? °There are many kinds of antibiotic medicines. To help your doctor pick one, tell your doctor if: °· You have any allergies. °· You are pregnant or plan to get pregnant. °· You are breastfeeding. °· You are taking any medicines. These include over-the-counter medicines, prescription medicines, and herbal remedies. °· You have a medical condition or problem. °If you have questions about why your medicine was picked, ask. °FOR HOW LONG SHOULD I TAKE MY MEDICINE? °Take your medicine for as long as your doctor tells you to. Do not stop taking it when you feel better. If you stop taking it too soon: °· You Larmore start to feel sick again. °· Your infection Genao get harder to treat. °· New problems Hileman develop. °WHAT IF I MISS A DOSE? °Try not to miss any doses of antibiotic medicine. If you miss a dose: °· Take the dose as soon as you can. °· If you are taking 2 doses a day, take the next dose in 5 to 6 hours. °· If you are taking 3 or more doses a day, take the next dose in 2 to 4 hours. Then go back to the normal schedule. °If you cannot take a missed dose, take the next dose on time. Then take the missed dose after you have taken all the doses as told by your doctor, as if you had one more dose left. °DOES THIS MEDICINE AFFECT BIRTH CONTROL? °Birth control pills Kollmann not work while you are on antibiotic medicines. If you are taking birth control pills, keep taking them as usual. Use a second form of birth control, such as a condom. Keep using the second form of birth control until you are finished with your current 1 month cycle of birth control pills. °GET HELP IF: °· You get worse. °· You do not feel better a few days after starting the medicine. °· You throw up (vomit). °· There are white patches in your mouth. °· You have new  joint pain after starting the medicine. °· You have new muscle aches after starting the medicine. °· You had a fever before starting the medicine, and it comes back. °· You have any symptoms of an allergic reaction, such as an itchy rash. If this happens, stop taking the medicine. °GET HELP RIGHT AWAY IF: °· Your pee (urine) turns dark or becomes blood-colored. °· Your skin turns yellow. °· You bruise or bleed easily. °· You have very bad watery poop (diarrhea) and cramps in your belly (abdomen). °· You have a very bad headache. °· You have signs of a very bad allergic reaction, such as: °¨ Trouble breathing. °¨ Wheezing. °¨ Swelling of the lips, tongue, or face. °¨ Fainting. °¨ Blisters on the skin or in the mouth. °If you have signs of a very bad allergic reaction, stop taking the antibiotic medicine right away. °  °This information is not intended to replace advice given to you by your health care provider. Make sure you discuss any questions you have with your health care provider. °  °Document Released: 01/28/2008 Document Revised: 01/09/2015 Document Reviewed: 09/05/2014 °Elsevier Interactive Patient Education ©2016 Elsevier Inc. °Urinary Tract Infection °Urinary tract infections (UTIs) can develop anywhere along your urinary tract. Your urinary tract is your body's drainage system for removing wastes and extra water. Your urinary tract includes   two kidneys, two ureters, a bladder, and a urethra. Your kidneys are a pair of bean-shaped organs. Each kidney is about the size of your fist. They are located below your ribs, one on each side of your spine. °CAUSES °Infections are caused by microbes, which are microscopic organisms, including fungi, viruses, and bacteria. These organisms are so small that they can only be seen through a microscope. Bacteria are the microbes that most commonly cause UTIs. °SYMPTOMS  °Symptoms of UTIs Beadles vary by age and gender of the patient and by the location of the infection.  Symptoms in young women typically include a frequent and intense urge to urinate and a painful, burning feeling in the bladder or urethra during urination. Older women and men are more likely to be tired, shaky, and weak and have muscle aches and abdominal pain. A fever Solazzo mean the infection is in your kidneys. Other symptoms of a kidney infection include pain in your back or sides below the ribs, nausea, and vomiting. °DIAGNOSIS °To diagnose a UTI, your caregiver will ask you about your symptoms. Your caregiver will also ask you to provide a urine sample. The urine sample will be tested for bacteria and white blood cells. White blood cells are made by your body to help fight infection. °TREATMENT  °Typically, UTIs can be treated with medication. Because most UTIs are caused by a bacterial infection, they usually can be treated with the use of antibiotics. The choice of antibiotic and length of treatment depend on your symptoms and the type of bacteria causing your infection. °HOME CARE INSTRUCTIONS °· If you were prescribed antibiotics, take them exactly as your caregiver instructs you. Finish the medication even if you feel better after you have only taken some of the medication. °· Drink enough water and fluids to keep your urine clear or pale yellow. °· Avoid caffeine, tea, and carbonated beverages. They tend to irritate your bladder. °· Empty your bladder often. Avoid holding urine for long periods of time. °· Empty your bladder before and after sexual intercourse. °· After a bowel movement, women should cleanse from front to back. Use each tissue only once. °SEEK MEDICAL CARE IF:  °· You have back pain. °· You develop a fever. °· Your symptoms do not begin to resolve within 3 days. °SEEK IMMEDIATE MEDICAL CARE IF:  °· You have severe back pain or lower abdominal pain. °· You develop chills. °· You have nausea or vomiting. °· You have continued burning or discomfort with urination. °MAKE SURE YOU:   °· Understand these instructions. °· Will watch your condition. °· Will get help right away if you are not doing well or get worse. °  °This information is not intended to replace advice given to you by your health care provider. Make sure you discuss any questions you have with your health care provider. °  °Document Released: 01/28/2005 Document Revised: 01/09/2015 Document Reviewed: 05/29/2011 °Elsevier Interactive Patient Education ©2016 Elsevier Inc. ° °

## 2015-03-25 NOTE — ED Notes (Addendum)
Pt comes in with possible UTI that started last Friday with freq urination, burn and pressure Pt gave urine specimen at PCP office last Wednesday and prescribed Monurol x 1 packet with relief States sx's restarted today C/o nausea and slight fever

## 2015-03-25 NOTE — ED Provider Notes (Signed)
CSN: [id number]     Arrival date & time 03/25/15  1615 History   First MD Initiated Contact with Patient 03/25/15 1803     Chief Complaint  Patient presents with  . Urinary Tract Infection   (Consider location/radiation/quality/duration/timing/severity/associated sxs/prior Treatment) HPI Comments: 81-year-old female presents with a complaint of possible UTI. She is having nausea, dysuria and urinary frequency that began last week. Symptoms have been somewhat intermittent and that she took a dose of Monuril and felt better for a few days. This morning she got worse with the above symptoms. Denies fever or chills.   Past Medical History  Diagnosis Date  . Seasonal allergies   . HTN (hypertension)   . Osteoarthritis   . Palpitations   . Anemia   . History of colonic polyps   . GERD (gastroesophageal reflux disease)   . Hyperlipidemia   . Chronic facial pain   . Vitamin B12 deficiency   . Dyslipidemia   . Chronic foot pain   . Chronic cough   . Obesity   . Anxiety   . Complication of anesthesia     Sore jaw; could not chew or move mouth  . Diabetes mellitus     type 2 niddm x 20 years  . Metatarsal bone fracture 2014  . Hammer toe     bilateral  . Hx of radiation therapy 03/07/13- 03/29/13    right breast 4250 cGy 17 sessions  . Ejection fraction   . Skin cancer   . Melanoma ([place])   . Cancer of right breast ([place]) 12/26/12    right breast 12:00 o'clock, DCIS  . Convulsions/seizures ([place]) 10/16/2014   Past Surgical History  Procedure Laterality Date  . Colonoscopy    . Polypectomy      small adenomatous  . Abdominal hysterectomy    . Cholecystectomy    . Hernia repair    . Knee arthroscopy Bilateral   . Ulnar tunnel release    . Eye surgery    . Cataract extraction w/ intraocular lens implant Right   . Bunionectomy    . Breast lumpectomy with needle localization Right 01/17/2013    Procedure: BREAST LUMPECTOMY WITH NEEDLE LOCALIZATION;  Surgeon: Edward Jolly,  MD;  Location: [facility];  Service: General;  Laterality: Right;  . Breast biopsy Right 01/24/2013    Procedure: RE-EXCICION OF BREAST CANCER, ANTERIOR MARGINS;  Surgeon: Edward Jolly, MD;  Location: [facility];  Service: General;  Laterality: Right;  . Craniotomy Right 10/18/2013    Procedure: CRANIOTOMY TUMOR EXCISION;  Surgeon: Floyce Stakes, MD;  Location: [facility];  Service: Neurosurgery;  Laterality: Right;   Family History  Problem Relation Age of Onset  . Colon cancer    . Heart disease Mother   . Diabetes Father   . Pancreatic cancer Father   . Bone cancer Sister   . Prostate cancer Brother   . Colon cancer Brother   . Rectal cancer Sister   . Thyroid disease Sister     benign goiter resected   Social History  Substance Use Topics  . Smoking status: Never Smoker   . Smokeless tobacco: Never Used  . Alcohol Use: No   OB History    No data available     Review of Systems  Constitutional: Positive for activity change. Negative for fever and chills.  HENT: Negative.   Respiratory: Negative.   Genitourinary: Positive for dysuria, urgency and frequency. Negative for pelvic pain.  Musculoskeletal: Negative.  Allergies  Bystolic; Cholestyramine; Hydrazine yellow; Morphine; Niacin; Niaspan; Norvasc; OptivarPrescott Gum; Tegretol; Telmisartan; Cefdinir; Clonidine; Clonidine hydrochloride; Codeine; Ezetimibe; Naproxen; Ziac; Elavil; Keppra; Lamictal; Lyrica; Topamax; Ace inhibitors; Actonel; Amlodipine besylate; Aspirin; Atacand; Bextra; Bisoprolol-hydrochlorothiazide; Candesartan cilexetil; Cefadroxil; Celecoxib; Hydrocodone; Hydrocodone-acetaminophen; Iodinated diagnostic agents; Meloxicam; Methylprednisolone sodium succinate; Nabumetone; Penicillins; Pseudoephedrine-guaifenesin; Risedronate sodium; Rofecoxib; Ru-tuss; Sulfonamide derivatives; Carpenter; Telithromycin; Terfenadine; Trandolapril-verapamil hcl er; and Valium  Home Medications   Prior to Admission medications    Medication Sig Start Date End Date Taking? Authorizing Provider  acetaminophen (TYLENOL) 500 MG tablet Take 1,000 mg by mouth every 6 (six) hours as needed for pain.    Historical Provider, MD  ALPRAZolam Duanne Moron) 0.25 MG tablet Take 1 tablet (0.25 mg total) by mouth at bedtime. 03/05/15   Hoyt Koch, MD  calcium carbonate (OS-CAL) 600 MG TABS Take 600 mg by mouth daily.     Historical Provider, MD  ciprofloxacin (CIPRO) 250 MG tablet Take 1 tablet (250 mg total) by mouth 2 (two) times daily. 03/25/15   Janne Napoleon, NP  colestipol (COLESTID) 1 G tablet Take 2 g by mouth 2 (two) times daily.    Historical Provider, MD  cyanocobalamin (,VITAMIN B-12,) 1000 MCG/ML injection Inject 1 mL (1,000 mcg total) into the muscle every 21 ( twenty-one) days. 01/25/15   Hoyt Koch, MD  fexofenadine (ALLEGRA) 60 MG tablet Take 60 mg by mouth daily as needed for allergies.     Historical Provider, MD  fosfomycin (MONUROL) 3 G PACK Take 3 g by mouth once. 03/20/15   Golden Circle, FNP  furosemide (LASIX) 20 MG tablet Take 1 tablet (20 mg total) by mouth daily. Do not take until I call you about labs. Patient taking differently: Take 20 mg by mouth daily as needed for fluid or edema. Do not take until I call you about labs. 12/27/13   Marin Olp, MD  lidocaine (LIDODERM) 5 % Place 1 patch onto the skin daily. Remove & Discard patch within 12 hours or as directed by MD Patient taking differently: Place 1 patch onto the skin daily as needed (pain). Remove & Discard patch within 12 hours or as directed by MD 01/25/15   Hoyt Koch, MD  metFORMIN (GLUCOPHAGE-XR) 500 MG 24 hr tablet 4 tabs daily Patient taking differently: Take 500 mg by mouth 4 (four) times daily -  with meals and at bedtime.  04/09/14   Renato Shin, MD  nateglinide (STARLIX) 120 MG tablet Take 1 tablet (120 mg total) by mouth 3 (three) times daily with meals. 03/19/15   Renato Shin, MD  ondansetron (ZOFRAN) 4 MG tablet  Take 1 tablet (4 mg total) by mouth every 6 (six) hours. 03/25/15   Janne Napoleon, NP  phenytoin (DILANTIN) 100 MG ER capsule Take 3 capsules (300 mg total) by mouth at bedtime. 03/04/15   Kathrynn Ducking, MD  phenytoin (DILANTIN) 50 MG tablet Chew 1 tablet (50 mg total) by mouth daily. 03/05/15   Kathrynn Ducking, MD  Polyethyl Glycol-Propyl Glycol 0.4-0.3 % SOLN Place 2 drops into both eyes at bedtime.     Historical Provider, MD  ranitidine (ZANTAC) 150 MG capsule Take 150 mg by mouth daily as needed for heartburn.     Historical Provider, MD  Preston Fleeting 2-180 MG per tablet TAKE 1 TABLET DAILY 02/06/14   Carlena Bjornstad, MD   Meds Ordered and Administered this Visit  Medications - No data to display  BP 194/99 mmHg  Pulse 83  Temp(Src) 98 F (36.7 C) (Oral)  Resp 20  SpO2 96% No data found.   Physical Exam  Constitutional: She appears well-developed and well-nourished. No distress.  Neck: Normal range of motion. Neck supple.  Cardiovascular: Normal rate.   Pulmonary/Chest: Effort normal. No respiratory distress.  Neurological: She is alert.  Skin: Skin is warm and dry.  Psychiatric: She has a normal mood and affect.  Nursing note and vitals reviewed.   ED Course  Procedures (including critical care time)  Labs Review Labs Reviewed  POCT URINALYSIS DIP (DEVICE) - Abnormal; Notable for the following:    Glucose, UA 100 (*)    Ketones, ur TRACE (*)    Hgb urine dipstick TRACE (*)    Protein, ur 100 (*)    Nitrite POSITIVE (*)    Leukocytes, UA LARGE (*)    All other components within normal limits  URINE CULTURE    Imaging Review  Visual Acuity Review  Right Eye Distance:   Left Eye Distance:   Bilateral Distance:    Right Eye Near:   Left Eye Near:    Bilateral Near:         MDM   1. UTI (lower urinary tract infection)    cipro 250 bid zofran for nausea Lots of fluids    Janne Napoleon, NP 03/25/15 Vernelle Emerald

## 2015-03-26 ENCOUNTER — Telehealth: Payer: Self-pay | Admitting: Endocrinology

## 2015-03-26 NOTE — Telephone Encounter (Signed)
I contacted the pt and advised of note below. Pt wanted to know if we could changed Starlix to another medication. Pt is very concerned about her BP med and the Starlix looking similar. Pt stated she has no method of being able to tell them apart.  Please advise, Thanks!

## 2015-03-26 NOTE — Telephone Encounter (Signed)
[id number] for brand starlix

## 2015-03-26 NOTE — Telephone Encounter (Signed)
Attempted to reach the pt. The PA for the brand name starlix was declined by her insurance. Rx will have to be the generic. Will try the pt at a later time to advise of this note.

## 2015-03-27 MED ORDER — REPAGLINIDE 0.5 MG PO TABS: 0.5000 mg | ORAL_TABLET | Freq: Three times a day (TID) | ORAL | Status: DC

## 2015-03-27 NOTE — Telephone Encounter (Signed)
Ok, i have sent a prescription to your pharmacy  

## 2015-03-28 LAB — URINE CULTURE
Culture: >=100000
Special Requests: NORMAL

## 2015-03-29 NOTE — ED Notes (Signed)
Final report of urine C&S positive for proteus, sensitive to rx provided day of visit

## 2015-04-02 ENCOUNTER — Other Ambulatory Visit: Payer: Self-pay

## 2015-04-02 DIAGNOSIS — E538 Deficiency of other specified B group vitamins: Secondary | ICD-10-CM | POA: Diagnosis not present

## 2015-04-02 DIAGNOSIS — Z0000 Encounter for general adult medical examination without abnormal findings: Secondary | ICD-10-CM | POA: Diagnosis not present

## 2015-04-02 DIAGNOSIS — R946 Abnormal results of thyroid function studies: Secondary | ICD-10-CM | POA: Diagnosis not present

## 2015-04-02 DIAGNOSIS — I10 Essential (primary) hypertension: Secondary | ICD-10-CM | POA: Diagnosis not present

## 2015-04-02 DIAGNOSIS — Z79899 Other long term (current) drug therapy: Secondary | ICD-10-CM | POA: Diagnosis not present

## 2015-04-02 DIAGNOSIS — E079 Disorder of thyroid, unspecified: Secondary | ICD-10-CM | POA: Diagnosis not present

## 2015-04-02 MED ORDER — REPAGLINIDE 0.5 MG PO TABS: 0.5000 mg | ORAL_TABLET | Freq: Three times a day (TID) | ORAL | Status: DC

## 2015-04-04 DIAGNOSIS — Z961 Presence of intraocular lens: Secondary | ICD-10-CM | POA: Diagnosis not present

## 2015-04-04 DIAGNOSIS — H25011 Cortical age-related cataract, right eye: Secondary | ICD-10-CM | POA: Diagnosis not present

## 2015-04-05 DIAGNOSIS — F419 Anxiety disorder, unspecified: Secondary | ICD-10-CM | POA: Diagnosis not present

## 2015-04-05 DIAGNOSIS — E119 Type 2 diabetes mellitus without complications: Secondary | ICD-10-CM | POA: Diagnosis not present

## 2015-04-05 DIAGNOSIS — R2689 Other abnormalities of gait and mobility: Secondary | ICD-10-CM | POA: Diagnosis not present

## 2015-04-05 DIAGNOSIS — M6281 Muscle weakness (generalized): Secondary | ICD-10-CM | POA: Diagnosis not present

## 2015-04-05 DIAGNOSIS — G4089 Other seizures: Secondary | ICD-10-CM | POA: Diagnosis not present

## 2015-04-05 DIAGNOSIS — Z7984 Long term (current) use of oral hypoglycemic drugs: Secondary | ICD-10-CM | POA: Diagnosis not present

## 2015-04-05 DIAGNOSIS — I10 Essential (primary) hypertension: Secondary | ICD-10-CM | POA: Diagnosis not present

## 2015-04-05 DIAGNOSIS — Z9181 History of falling: Secondary | ICD-10-CM | POA: Diagnosis not present

## 2015-04-10 DIAGNOSIS — G4089 Other seizures: Secondary | ICD-10-CM | POA: Diagnosis not present

## 2015-04-10 DIAGNOSIS — E119 Type 2 diabetes mellitus without complications: Secondary | ICD-10-CM | POA: Diagnosis not present

## 2015-04-10 DIAGNOSIS — M6281 Muscle weakness (generalized): Secondary | ICD-10-CM | POA: Diagnosis not present

## 2015-04-10 DIAGNOSIS — R2689 Other abnormalities of gait and mobility: Secondary | ICD-10-CM | POA: Diagnosis not present

## 2015-04-10 DIAGNOSIS — F419 Anxiety disorder, unspecified: Secondary | ICD-10-CM | POA: Diagnosis not present

## 2015-04-10 DIAGNOSIS — I10 Essential (primary) hypertension: Secondary | ICD-10-CM | POA: Diagnosis not present

## 2015-04-11 DIAGNOSIS — E119 Type 2 diabetes mellitus without complications: Secondary | ICD-10-CM | POA: Diagnosis not present

## 2015-04-11 DIAGNOSIS — R946 Abnormal results of thyroid function studies: Secondary | ICD-10-CM | POA: Diagnosis not present

## 2015-04-11 DIAGNOSIS — D0510 Intraductal carcinoma in situ of unspecified breast: Secondary | ICD-10-CM | POA: Diagnosis not present

## 2015-04-12 DIAGNOSIS — I10 Essential (primary) hypertension: Secondary | ICD-10-CM | POA: Diagnosis not present

## 2015-04-12 DIAGNOSIS — F419 Anxiety disorder, unspecified: Secondary | ICD-10-CM | POA: Diagnosis not present

## 2015-04-12 DIAGNOSIS — G4089 Other seizures: Secondary | ICD-10-CM | POA: Diagnosis not present

## 2015-04-12 DIAGNOSIS — R2689 Other abnormalities of gait and mobility: Secondary | ICD-10-CM | POA: Diagnosis not present

## 2015-04-12 DIAGNOSIS — M6281 Muscle weakness (generalized): Secondary | ICD-10-CM | POA: Diagnosis not present

## 2015-04-12 DIAGNOSIS — E119 Type 2 diabetes mellitus without complications: Secondary | ICD-10-CM | POA: Diagnosis not present

## 2015-04-15 ENCOUNTER — Telehealth: Payer: Self-pay | Admitting: Neurology

## 2015-04-15 DIAGNOSIS — R197 Diarrhea, unspecified: Secondary | ICD-10-CM | POA: Diagnosis not present

## 2015-04-15 DIAGNOSIS — R946 Abnormal results of thyroid function studies: Secondary | ICD-10-CM | POA: Diagnosis not present

## 2015-04-15 DIAGNOSIS — E1159 Type 2 diabetes mellitus with other circulatory complications: Secondary | ICD-10-CM | POA: Diagnosis not present

## 2015-04-15 DIAGNOSIS — E039 Hypothyroidism, unspecified: Secondary | ICD-10-CM | POA: Diagnosis not present

## 2015-04-15 NOTE — Telephone Encounter (Signed)
It appears that the patient never actually went up on the Dilantin dose when instructed from 200-300 mg a day, this likely explains why the blood levels never increased. When the patient was increased to 350 mg a day, the blood levels did go up, probably because she was actually taking 250 mg daily. The patient will go back down to 250 mg a day of the Dilantin.

## 2015-04-15 NOTE — Telephone Encounter (Signed)
Patient called about phenytoin (DILANTIN) 100 MG ER capsule, saw on the bottle where it says to take 3 at night has been taking 2, has taken 3 at night for 5-6 days and it does a number on her, feels weak,"I feel like I'm dying", "it's affecting my balance, feels like I'll fall over, nerves in body are real sensitive, it's affecting my brain, I can't think, would like to go down to 2 a night, has taken 2 a night for the last 2 nights, hasn't seen a difference yet, I feel lifeless, I can't function". Patient also states Dr. Ernie Hew told her last week that she needs to come back today to go over results of thyroid, thyroid was not normal.

## 2015-04-16 DIAGNOSIS — E119 Type 2 diabetes mellitus without complications: Secondary | ICD-10-CM | POA: Diagnosis not present

## 2015-04-16 DIAGNOSIS — R2689 Other abnormalities of gait and mobility: Secondary | ICD-10-CM | POA: Diagnosis not present

## 2015-04-16 DIAGNOSIS — I10 Essential (primary) hypertension: Secondary | ICD-10-CM | POA: Diagnosis not present

## 2015-04-16 DIAGNOSIS — F419 Anxiety disorder, unspecified: Secondary | ICD-10-CM | POA: Diagnosis not present

## 2015-04-16 DIAGNOSIS — G4089 Other seizures: Secondary | ICD-10-CM | POA: Diagnosis not present

## 2015-04-16 DIAGNOSIS — M6281 Muscle weakness (generalized): Secondary | ICD-10-CM | POA: Diagnosis not present

## 2015-04-17 DIAGNOSIS — H25812 Combined forms of age-related cataract, left eye: Secondary | ICD-10-CM | POA: Diagnosis not present

## 2015-04-17 DIAGNOSIS — H2512 Age-related nuclear cataract, left eye: Secondary | ICD-10-CM | POA: Diagnosis not present

## 2015-04-18 DIAGNOSIS — I10 Essential (primary) hypertension: Secondary | ICD-10-CM | POA: Diagnosis not present

## 2015-04-18 DIAGNOSIS — G4089 Other seizures: Secondary | ICD-10-CM | POA: Diagnosis not present

## 2015-04-18 DIAGNOSIS — F419 Anxiety disorder, unspecified: Secondary | ICD-10-CM | POA: Diagnosis not present

## 2015-04-18 DIAGNOSIS — E119 Type 2 diabetes mellitus without complications: Secondary | ICD-10-CM | POA: Diagnosis not present

## 2015-04-18 DIAGNOSIS — M6281 Muscle weakness (generalized): Secondary | ICD-10-CM | POA: Diagnosis not present

## 2015-04-18 DIAGNOSIS — R2689 Other abnormalities of gait and mobility: Secondary | ICD-10-CM | POA: Diagnosis not present

## 2015-04-22 DIAGNOSIS — G4089 Other seizures: Secondary | ICD-10-CM | POA: Diagnosis not present

## 2015-04-22 DIAGNOSIS — M6281 Muscle weakness (generalized): Secondary | ICD-10-CM | POA: Diagnosis not present

## 2015-04-22 DIAGNOSIS — I10 Essential (primary) hypertension: Secondary | ICD-10-CM | POA: Diagnosis not present

## 2015-04-22 DIAGNOSIS — E119 Type 2 diabetes mellitus without complications: Secondary | ICD-10-CM | POA: Diagnosis not present

## 2015-04-22 DIAGNOSIS — R2689 Other abnormalities of gait and mobility: Secondary | ICD-10-CM | POA: Diagnosis not present

## 2015-04-22 DIAGNOSIS — F419 Anxiety disorder, unspecified: Secondary | ICD-10-CM | POA: Diagnosis not present

## 2015-04-23 DIAGNOSIS — M6281 Muscle weakness (generalized): Secondary | ICD-10-CM | POA: Diagnosis not present

## 2015-04-23 DIAGNOSIS — G4089 Other seizures: Secondary | ICD-10-CM | POA: Diagnosis not present

## 2015-04-23 DIAGNOSIS — R2689 Other abnormalities of gait and mobility: Secondary | ICD-10-CM | POA: Diagnosis not present

## 2015-04-23 DIAGNOSIS — I10 Essential (primary) hypertension: Secondary | ICD-10-CM | POA: Diagnosis not present

## 2015-04-24 DIAGNOSIS — E119 Type 2 diabetes mellitus without complications: Secondary | ICD-10-CM | POA: Diagnosis not present

## 2015-04-24 DIAGNOSIS — I10 Essential (primary) hypertension: Secondary | ICD-10-CM | POA: Diagnosis not present

## 2015-04-24 DIAGNOSIS — G4089 Other seizures: Secondary | ICD-10-CM | POA: Diagnosis not present

## 2015-04-24 DIAGNOSIS — M6281 Muscle weakness (generalized): Secondary | ICD-10-CM | POA: Diagnosis not present

## 2015-04-24 DIAGNOSIS — R2689 Other abnormalities of gait and mobility: Secondary | ICD-10-CM | POA: Diagnosis not present

## 2015-04-24 DIAGNOSIS — F419 Anxiety disorder, unspecified: Secondary | ICD-10-CM | POA: Diagnosis not present

## 2015-04-30 ENCOUNTER — Encounter: Payer: Self-pay | Admitting: Podiatry

## 2015-04-30 ENCOUNTER — Ambulatory Visit (INDEPENDENT_AMBULATORY_CARE_PROVIDER_SITE_OTHER): Payer: Medicare Other | Admitting: Podiatry

## 2015-04-30 VITALS — BP 128/76 | HR 69 | Resp 12

## 2015-04-30 DIAGNOSIS — E119 Type 2 diabetes mellitus without complications: Secondary | ICD-10-CM | POA: Diagnosis not present

## 2015-04-30 DIAGNOSIS — M6281 Muscle weakness (generalized): Secondary | ICD-10-CM | POA: Diagnosis not present

## 2015-04-30 DIAGNOSIS — G4089 Other seizures: Secondary | ICD-10-CM | POA: Diagnosis not present

## 2015-04-30 DIAGNOSIS — F419 Anxiety disorder, unspecified: Secondary | ICD-10-CM | POA: Diagnosis not present

## 2015-04-30 DIAGNOSIS — L84 Corns and callosities: Secondary | ICD-10-CM | POA: Diagnosis not present

## 2015-04-30 DIAGNOSIS — R2689 Other abnormalities of gait and mobility: Secondary | ICD-10-CM | POA: Diagnosis not present

## 2015-04-30 DIAGNOSIS — E1142 Type 2 diabetes mellitus with diabetic polyneuropathy: Secondary | ICD-10-CM | POA: Diagnosis not present

## 2015-04-30 DIAGNOSIS — I10 Essential (primary) hypertension: Secondary | ICD-10-CM | POA: Diagnosis not present

## 2015-04-30 NOTE — Patient Instructions (Signed)
Diabetes and Foot Care Diabetes Hoffmann cause you to have problems because of poor blood supply (circulation) to your feet and legs. This Henkin cause the skin on your feet to become thinner, break easier, and heal more slowly. Your skin Bracken become dry, and the skin Siegrist peel and crack. You Arcia also have nerve damage in your legs and feet causing decreased feeling in them. You Stankiewicz not notice minor injuries to your feet that could lead to infections or more serious problems. Taking care of your feet is one of the most important things you can do for yourself.  HOME CARE INSTRUCTIONS  Wear shoes at all times, even in the house. Do not go barefoot. Bare feet are easily injured.  Check your feet daily for blisters, cuts, and redness. If you cannot see the bottom of your feet, use a mirror or ask someone for help.  Wash your feet with warm water (do not use hot water) and mild soap. Then pat your feet and the areas between your toes until they are completely dry. Do not soak your feet as this can dry your skin.  Apply a moisturizing lotion or petroleum jelly (that does not contain alcohol and is unscented) to the skin on your feet and to dry, brittle toenails. Do not apply lotion between your toes.  Trim your toenails straight across. Do not dig under them or around the cuticle. File the edges of your nails with an emery board or nail file.  Do not cut corns or calluses or try to remove them with medicine.  Wear clean socks or stockings every day. Make sure they are not too tight. Do not wear knee-high stockings since they Dickenson decrease blood flow to your legs.  Wear shoes that fit properly and have enough cushioning. To break in new shoes, wear them for just a few hours a day. This prevents you from injuring your feet. Always look in your shoes before you put them on to be sure there are no objects inside.  Do not cross your legs. This Golomb decrease the blood flow to your feet.  If you find a minor scrape,  cut, or break in the skin on your feet, keep it and the skin around it clean and dry. These areas Hunkele be cleansed with mild soap and water. Do not cleanse the area with peroxide, alcohol, or iodine.  When you remove an adhesive bandage, be sure not to damage the skin around it.  If you have a wound, look at it several times a day to make sure it is healing.  Do not use heating pads or hot water bottles. They Pancake burn your skin. If you have lost feeling in your feet or legs, you Helbert not know it is happening until it is too late.  Make sure your health care provider performs a complete foot exam at least annually or more often if you have foot problems. Report any cuts, sores, or bruises to your health care provider immediately. SEEK MEDICAL CARE IF:   You have an injury that is not healing.  You have cuts or breaks in the skin.  You have an ingrown nail.  You notice redness on your legs or feet.  You feel burning or tingling in your legs or feet.  You have pain or cramps in your legs and feet.  Your legs or feet are numb.  Your feet always feel cold. SEEK IMMEDIATE MEDICAL CARE IF:   There is increasing redness,   swelling, or pain in or around a wound.  There is a red line that goes up your leg.  Pus is coming from a wound.  You develop a fever or as directed by your health care provider.  You notice a bad smell coming from an ulcer or wound.   This information is not intended to replace advice given to you by your health care provider. Make sure you discuss any questions you have with your health care provider.   Document Released: 04/17/2000 Document Revised: 12/21/2012 Document Reviewed: 09/27/2012 Elsevier Interactive Patient Education 2016 Elsevier Inc.  

## 2015-04-30 NOTE — Progress Notes (Signed)
Patient ID: [name], female   DOB: 09-09-1929, 81 y.o.   MRN: [id number]  Subjective: This patient presents for a scheduled visit of proximally 6 weeks for debridement of pre-ulcerative plantar calluses on the right and left feet  Objective: Patient wearing pump style shoes Bleeding callus sub-first MPJ bilaterally that remains closed after debridement  Assessment: Diabetic peripheral neuropathy Pre-ulcerative plantar callus 2 Noncompliance of patient and she wear heel shoes  Plan: Debrided pre-ulcerative calluses 2 without any bleeding Patient advised to wear diabetic shoes with custom insoles  Reappoint 6

## 2015-05-03 DIAGNOSIS — I10 Essential (primary) hypertension: Secondary | ICD-10-CM | POA: Diagnosis not present

## 2015-05-03 DIAGNOSIS — R2689 Other abnormalities of gait and mobility: Secondary | ICD-10-CM | POA: Diagnosis not present

## 2015-05-03 DIAGNOSIS — G4089 Other seizures: Secondary | ICD-10-CM | POA: Diagnosis not present

## 2015-05-03 DIAGNOSIS — M6281 Muscle weakness (generalized): Secondary | ICD-10-CM | POA: Diagnosis not present

## 2015-05-03 DIAGNOSIS — F419 Anxiety disorder, unspecified: Secondary | ICD-10-CM | POA: Diagnosis not present

## 2015-05-03 DIAGNOSIS — E119 Type 2 diabetes mellitus without complications: Secondary | ICD-10-CM | POA: Diagnosis not present

## 2015-05-13 DIAGNOSIS — G4089 Other seizures: Secondary | ICD-10-CM | POA: Diagnosis not present

## 2015-05-13 DIAGNOSIS — M6281 Muscle weakness (generalized): Secondary | ICD-10-CM | POA: Diagnosis not present

## 2015-05-13 DIAGNOSIS — I10 Essential (primary) hypertension: Secondary | ICD-10-CM | POA: Diagnosis not present

## 2015-05-13 DIAGNOSIS — R2689 Other abnormalities of gait and mobility: Secondary | ICD-10-CM | POA: Diagnosis not present

## 2015-05-13 DIAGNOSIS — E119 Type 2 diabetes mellitus without complications: Secondary | ICD-10-CM | POA: Diagnosis not present

## 2015-05-13 DIAGNOSIS — F419 Anxiety disorder, unspecified: Secondary | ICD-10-CM | POA: Diagnosis not present

## 2015-05-14 ENCOUNTER — Telehealth: Payer: Self-pay

## 2015-05-14 ENCOUNTER — Ambulatory Visit (INDEPENDENT_AMBULATORY_CARE_PROVIDER_SITE_OTHER): Payer: Medicare Other | Admitting: Adult Health

## 2015-05-14 ENCOUNTER — Encounter: Payer: Self-pay | Admitting: Adult Health

## 2015-05-14 VITALS — BP 141/89 | HR 74 | Ht 67.5 in | Wt 136.0 lb

## 2015-05-14 DIAGNOSIS — R569 Unspecified convulsions: Secondary | ICD-10-CM

## 2015-05-14 DIAGNOSIS — Z5181 Encounter for therapeutic drug level monitoring: Secondary | ICD-10-CM

## 2015-05-14 NOTE — Progress Notes (Signed)
I have read the note, and I agree with the clinical assessment and plan.  [name],[name]   

## 2015-05-14 NOTE — Telephone Encounter (Signed)
Spoke to [place] @ PCP office. Will have 05/14/15 lab work ordered by NP-Megan drawn on 05/23/15 along with other lab work by PCP. Called patient to inform. No answer.

## 2015-05-14 NOTE — Progress Notes (Signed)
PATIENT: [name] DOB: 1929/06/17  REASON FOR VISIT: follow up HISTORY FROM: patient  HISTORY OF PRESENT ILLNESS: [name] is an 82 year old female with a history of meningioma resection, encephalomalacia and subsequent seizures. She returns today for follow-up. The patient is currently on Dilantin and tolerating it well. The patient denies any seizure events however she states that on December 29 her caregiver had a hard time waking her up. She states that she went to her bedroom and laid across the bed to place eyedrops in her eyes. She became very sleepy and went to sleep. When he did arouse her she moved to a another room with him but then went right back to sleep. He called EMS who evaluated the patient. She did not go to the hospital. There was no witnessed seizure activity. She denies biting her tongue or loss of the bladder or bowels. The patient states that she did not sleep well the night before so she feels that she was just sleepy. She denies missing any of her medications. She states that she was sick during this time. The patient states that she currently takes Dilantin 150 mg in the morning and 200 mg in the evening. She does state that she has issues with her balance but this has been ongoing. She reports that she has a "knot" in the back of the head as well as in the right frontal region where her incision is. She denies any discomfort/pain. She denies any neurological changes. She reports that she recently was diagnosed with hyperthyroidism and was started on Synthroid. She returns today for an evaluation.  HISTORY 02/06/15 ([name]): [name] is an 82 year old right-handed white female with a history of meningioma resection, encephalomalacia, and subsequent seizures. The patient has been placed on a multitude of anti-epileptic medications, she could not tolerate many of them. The patient has been placed on Dilantin, she seems to be doing fairly well with this. She recently was  increased on the Dilantin from 200 mg daily to 300 mg daily with a blood level of 2.9. She felt poorly on this dose increase, feeling somewhat depressed and irritable. The patient indicates that she did not miss a dose, she was seen in the emergency room as she felt bad on 02/04/2015. The patient had a blood level of 3.0 with the Dilantin 2 weeks after she increased the medication. She indicates that she went home that evening, and suffered a seizure while asleep in bed, she woke up on the floor, she had bowel incontinence. The patient returns for an evaluation.  REVIEW OF SYSTEMS: Out of a complete 14 system review of symptoms, the patient complains only of the following symptoms, and all other reviewed systems are negative.  Fatigue, runny nose, trouble swallowing, ear pain, cough, Black stools, diarrhea, daytime sleepiness, back pain, itching, urgency, frequent infections, swollen lymph nodes, bruise easily, anemia, seizures, weakness, nervous/anxious  ALLERGIES: Allergies  Allergen Reactions  . Bystolic [Nebivolol Hcl] Other (See Comments)    "extreme weakness, heaviness in legs & arms, swelling in legs/arms/face, swollen abdomen, pain in bladder, feet pain, soreness in chest"  . Cholestyramine     "itching rash on stomach, bloated, nausea, vomiting, sleeplessness, extreme pain in arms"  . Hydrazine Yellow [Tartrazine] Other (See Comments)    "does not reduce high blood pressure, pain in arm, high pressure, felt like I was on verge of heart attack, really weak"  . Morphine Other (See Comments)    Feels morbid, weak, still  in pain  . Niacin Palpitations    Fast heart beat  . Niaspan [Niacin Er] Palpitations and Other (See Comments)    "fast heart beat, high blood pressure"  . Norvasc [Amlodipine Besylate] Other (See Comments)    "extreme fluid retention/pain)  . Optivar [Azelastine Hcl] Photosensitivity  . Sular [Nisoldipine Er] Other (See Comments)    "severe headaches, swelling eyes,  hands, feet, shortness of breath, weak, flushed face, brain boiling, fluid retention, high blood sugar, nervous, heart fast beating"  . Tegretol [Carbamazepine] Other (See Comments)    Blood poisoning   . Telmisartan Other (See Comments)    "headache, difficulty urinating, high blood sugar, fluid retention"  . Cefdinir Swelling    Vaginal irritation, breathing,   . Clonidine Other (See Comments)    Dry mouth, fluid retention  . Clonidine Hydrochloride Other (See Comments)    Dry mouth, fluid retention  . Codeine Nausea And Vomiting  . Ezetimibe Other (See Comments)    Made weak  . Naproxen Other (See Comments)    Shrinks bladder  . Ziac [Bisoprolol-Hydrochlorothiazide] Other (See Comments)    "stopped urination"  . Elavil [Amitriptyline] Other (See Comments)    Gave Pt nightmares  . Keppra [Levetiracetam]     Shaking  . Lamictal [Lamotrigine]     itching  . Lyrica [Pregabalin] Swelling  . Topamax [Topiramate]     Dry eyes  . Ace Inhibitors Other (See Comments)    unknown  . Actonel [Risedronate Sodium] Other (See Comments)    unknown  . Amlodipine Besylate Other (See Comments)    unknown  . Aspirin Other (See Comments)    unknown  . Atacand [Candesartan] Other (See Comments)    unknown  . Bextra [Valdecoxib] Other (See Comments)    unknown  . Bisoprolol-Hydrochlorothiazide Other (See Comments)    unknown  . Candesartan Cilexetil Other (See Comments)    unknown  . Cefadroxil Other (See Comments)    unknown  . Celecoxib Rash  . Hydrocodone Other (See Comments)    unknown  . Hydrocodone-Acetaminophen Other (See Comments)    unknown  . Iodinated Diagnostic Agents Rash    "All over"  . Meloxicam Other (See Comments)    unknown  . Methylprednisolone Sodium Succinate Other (See Comments)    unknown  . Nabumetone Other (See Comments)    unknown  . Penicillins Other (See Comments)    unknown  . Pseudoephedrine-Guaifenesin Other (See Comments)    unknown  .  Risedronate Sodium Other (See Comments)    unknown  . Rofecoxib Other (See Comments)    unknown  . Ru-Tuss [Chlorphen-Pse-Atrop-Hyos-Scop] Other (See Comments)    unknown  . Sulfonamide Derivatives Other (See Comments)    unknown  . Sulphur [Sulfur] Other (See Comments)    unknown  . Telithromycin Other (See Comments)    unknown  . Terfenadine Other (See Comments)    unknown  . Trandolapril-Verapamil Hcl Er Other (See Comments)    Headache, difficulty urinating, high blood sugar, fluid retention  . Valium [Diazepam] Other (See Comments)    Makes her mean and hyper    HOME MEDICATIONS: Outpatient Prescriptions Prior to Visit  Medication Sig Dispense Refill  . acetaminophen (TYLENOL) 500 MG tablet Take 1,000 mg by mouth every 6 (six) hours as needed for pain.    Marland Kitchen ALPRAZolam (XANAX) 0.25 MG tablet Take 1 tablet (0.25 mg total) by mouth at bedtime. 30 tablet 1  . calcium carbonate (OS-CAL) 600 MG TABS Take 600  mg by mouth daily.     . colestipol (COLESTID) 1 G tablet Take 2 g by mouth 2 (two) times daily.    . cyanocobalamin (,VITAMIN B-12,) 1000 MCG/ML injection Inject 1 mL (1,000 mcg total) into the muscle every 21 ( twenty-one) days. 3 mL 3  . fexofenadine (ALLEGRA) 60 MG tablet Take 60 mg by mouth daily as needed for allergies.     . furosemide (LASIX) 20 MG tablet Take 1 tablet (20 mg total) by mouth daily. Do not take until I call you about labs. (Patient taking differently: Take 20 mg by mouth daily as needed for fluid or edema. Do not take until I call you about labs.) 30 tablet 0  . lidocaine (LIDODERM) 5 % Place 1 patch onto the skin daily. Remove & Discard patch within 12 hours or as directed by MD (Patient taking differently: Place 1 patch onto the skin daily as needed (pain). Remove & Discard patch within 12 hours or as directed by MD) 90 patch 1  . metFORMIN (GLUCOPHAGE-XR) 500 MG 24 hr tablet 4 tabs daily (Patient taking differently: Take 500 mg by mouth 4 (four) times  daily -  with meals and at bedtime. ) 360 tablet 3  . ondansetron (ZOFRAN) 4 MG tablet Take 1 tablet (4 mg total) by mouth every 6 (six) hours. 12 tablet 0  . phenytoin (DILANTIN) 100 MG ER capsule Take 3 capsules (300 mg total) by mouth at bedtime. (Patient taking differently: Take 200 mg by mouth at bedtime. ) 270 capsule 1  . phenytoin (DILANTIN) 50 MG tablet Chew 1 tablet (50 mg total) by mouth daily. 90 tablet 1  . Polyethyl Glycol-Propyl Glycol 0.4-0.3 % SOLN Place 2 drops into both eyes at bedtime.     . ranitidine (ZANTAC) 150 MG capsule Take 150 mg by mouth daily as needed for heartburn.     . repaglinide (PRANDIN) 0.5 MG tablet Take 1 tablet (0.5 mg total) by mouth 3 (three) times daily before meals. 90 tablet 11  . TARKA 2-180 MG per tablet TAKE 1 TABLET DAILY 90 tablet 1  . ciprofloxacin (CIPRO) 250 MG tablet Take 1 tablet (250 mg total) by mouth 2 (two) times daily. 14 tablet 0  . fosfomycin (MONUROL) 3 G PACK Take 3 g by mouth once. 1 g 0   No facility-administered medications prior to visit.    PAST MEDICAL HISTORY: Past Medical History  Diagnosis Date  . Seasonal allergies   . HTN (hypertension)   . Osteoarthritis   . Palpitations   . Anemia   . History of colonic polyps   . GERD (gastroesophageal reflux disease)   . Hyperlipidemia   . Chronic facial pain   . Vitamin B12 deficiency   . Dyslipidemia   . Chronic foot pain   . Chronic cough   . Obesity   . Anxiety   . Complication of anesthesia     Sore jaw; could not chew or move mouth  . Diabetes mellitus     type 2 niddm x 20 years  . Metatarsal bone fracture 2014  . Hammer toe     bilateral  . Hx of radiation therapy 03/07/13- 03/29/13    right breast 4250 cGy 17 sessions  . Ejection fraction   . Skin cancer   . Melanoma ([place])   . Cancer of right breast ([place]) 12/26/12    right breast 12:00 o'clock, DCIS  . Convulsions/seizures ([place]) 10/16/2014    PAST SURGICAL HISTORY: Past  Surgical History  Procedure  Laterality Date  . Colonoscopy    . Polypectomy      small adenomatous  . Abdominal hysterectomy    . Cholecystectomy    . Hernia repair    . Knee arthroscopy Bilateral   . Ulnar tunnel release    . Eye surgery    . Cataract extraction w/ intraocular lens implant Right   . Bunionectomy    . Breast lumpectomy with needle localization Right 01/17/2013    Procedure: BREAST LUMPECTOMY WITH NEEDLE LOCALIZATION;  Surgeon: Edward Jolly, MD;  Location: [facility];  Service: General;  Laterality: Right;  . Breast biopsy Right 01/24/2013    Procedure: RE-EXCICION OF BREAST CANCER, ANTERIOR MARGINS;  Surgeon: Edward Jolly, MD;  Location: [facility];  Service: General;  Laterality: Right;  . Craniotomy Right 10/18/2013    Procedure: CRANIOTOMY TUMOR EXCISION;  Surgeon: Floyce Stakes, MD;  Location: [facility];  Service: Neurosurgery;  Laterality: Right;    FAMILY HISTORY: Family History  Problem Relation Age of Onset  . Colon cancer    . Heart disease Mother   . Diabetes Father   . Pancreatic cancer Father   . Bone cancer Sister   . Prostate cancer Brother   . Colon cancer Brother   . Rectal cancer Sister   . Thyroid disease Sister     benign goiter resected    SOCIAL HISTORY: Social History   Social History  . Marital Status: Widowed    Spouse Name: N/A  . Number of Children: 0  . Years of Education: college   Occupational History  . retired    Social History Main Topics  . Smoking status: Never Smoker   . Smokeless tobacco: Never Used  . Alcohol Use: No  . Drug Use: No  . Sexual Activity: Yes     Comment: menarche age 89, hysterectomy age 41, HRT x 2-3 mos, G1- miscarriage   Other Topics Concern  . Not on file   Social History Narrative   Widowed, lives alone.  Ambulates independently.    Drinks decaf only   Patient is right handed.      PHYSICAL EXAM  Filed Vitals:   05/14/15 1127  BP: 141/89  Pulse: 74  Height: 5' 7.5" (1.715 m)  Weight: 136 lb  (61.689 kg)   Body mass index is 20.97 kg/(m^2).  Generalized: Well developed, in no acute distress  HEENT: Small nodule in the right parietal region of the scalp. No erythema or pain to palpitation. Small nodule in the right frontal region near incision scar. No erythema.   Neurological examination  Mentation: Alert oriented to time, place, history taking. Follows all commands speech and language fluent Cranial nerve II-XII: Pupils were equal round reactive to light. Extraocular movements were full, visual field were full on confrontational test. Facial sensation and strength were normal. Uvula tongue midline. Head turning and shoulder shrug  were normal and symmetric. Motor: The motor testing reveals 5 over 5 strength of all 4 extremities. Good symmetric motor tone is noted throughout.  Sensory: Sensory testing is intact to soft touch on all 4 extremities. No evidence of extinction is noted.  Coordination: Cerebellar testing reveals good finger-nose-finger and heel-to-shin bilaterally.  Gait and station: Gait is normal. Tandem gait is unsteady Romberg is negative. No drift is seen.  Reflexes: Deep tendon reflexes are symmetric and normal bilaterally.   DIAGNOSTIC DATA (LABS, IMAGING, TESTING) - I reviewed patient records, labs, notes, testing and  imaging myself where available.  Lab Results  Component Value Date   WBC 6.1 01/16/2015   HGB 11.6* 11/07/2014   HCT 34.0 01/16/2015   MCV 90 01/16/2015   PLT 257 01/16/2015      Component Value Date/Time   NA 132* 02/04/2015 2000   NA 143 01/16/2015 1229   NA 138 01/04/2013 1212   K 4.0 02/04/2015 2000   K 3.9 01/04/2013 1212   CL 100* 02/04/2015 2000   CO2 23 02/04/2015 2000   CO2 22 01/04/2013 1212   GLUCOSE 123* 02/04/2015 2000   GLUCOSE 138* 01/16/2015 1229   GLUCOSE 285* 01/04/2013 1212   BUN 13 02/04/2015 2000   BUN 9 01/16/2015 1229   BUN 10.3 01/04/2013 1212   CREATININE 0.85 02/04/2015 2000   CREATININE 0.78 12/01/2013  1659   CREATININE 1.0 01/04/2013 1212   CALCIUM 8.3* 02/04/2015 2000   CALCIUM 9.6 01/04/2013 1212   PROT 5.4* 02/04/2015 2000   PROT 5.8* 01/16/2015 1229   PROT 6.4 01/04/2013 1212   ALBUMIN 3.5 02/04/2015 2000   ALBUMIN 4.0 01/16/2015 1229   ALBUMIN 3.6 01/04/2013 1212   AST 20 02/04/2015 2000   AST 14 01/04/2013 1212   ALT 21 02/04/2015 2000   ALT 16 01/04/2013 1212   ALKPHOS 82 02/04/2015 2000   ALKPHOS 52 01/04/2013 1212   BILITOT 0.4 02/04/2015 2000   BILITOT <0.2 01/16/2015 1229   BILITOT 1.01 01/04/2013 1212   GFRNONAA >60 02/04/2015 2000   GFRAA >60 02/04/2015 2000   Lab Results  Component Value Date   CHOL 148 01/30/2013   HDL 47.10 01/30/2013   LDLCALC 65 01/30/2013   LDLDIRECT 85.7 09/05/2009   TRIG 179.0* 01/30/2013   CHOLHDL 3 01/30/2013     Lab Results  Component Value Date   TSH 3.83 07/17/2014      ASSESSMENT AND PLAN 82 y.o. year old female  has a past medical history of Seasonal allergies; HTN (hypertension); Osteoarthritis; Palpitations; Anemia; History of colonic polyps; GERD (gastroesophageal reflux disease); Hyperlipidemia; Chronic facial pain; Vitamin B12 deficiency; Dyslipidemia; Chronic foot pain; Chronic cough; Obesity; Anxiety; Complication of anesthesia; Diabetes mellitus; Metatarsal bone fracture (2014); Hammer toe; radiation therapy (03/07/13- 03/29/13); Ejection fraction; Skin cancer; Melanoma ([place]); Cancer of right breast ([place]) (12/26/12); and Convulsions/seizures ([place]) (10/16/2014). here with:  1. Seizures 2. History of meningioma  Overall the patient is doing well. She will continue on the current dose of Dilantin. We need to check blood work however the patient took her medications this morning. She will come back in for blood work. Patient advised that if she has any seizure episode she should let us know. She will follow-up in 3 months or sooner if needed.     Ward Givens, MSN, NP-C 05/14/2015, 12:56 PM [facility] [address] [place], [place] [address] [phone]

## 2015-05-14 NOTE — Patient Instructions (Signed)
We need to check bloodwork- when you have not taken your dilantin- will ask Primary care about adding it on If your symptoms worsen or you develop new symptoms please let us know.

## 2015-05-16 DIAGNOSIS — I10 Essential (primary) hypertension: Secondary | ICD-10-CM | POA: Diagnosis not present

## 2015-05-16 DIAGNOSIS — E119 Type 2 diabetes mellitus without complications: Secondary | ICD-10-CM | POA: Diagnosis not present

## 2015-05-16 DIAGNOSIS — F419 Anxiety disorder, unspecified: Secondary | ICD-10-CM | POA: Diagnosis not present

## 2015-05-16 DIAGNOSIS — G4089 Other seizures: Secondary | ICD-10-CM | POA: Diagnosis not present

## 2015-05-16 DIAGNOSIS — R2689 Other abnormalities of gait and mobility: Secondary | ICD-10-CM | POA: Diagnosis not present

## 2015-05-16 DIAGNOSIS — M6281 Muscle weakness (generalized): Secondary | ICD-10-CM | POA: Diagnosis not present

## 2015-05-17 NOTE — Telephone Encounter (Signed)
I called and spoke to pt and relayed that she can have done at her pcp,  Lattie Haw at pcp was notified.  I instructed to here that she needs to take her dilantin with her to pcp and take med after having her blood drawn.   She verbalized understanding.

## 2015-05-17 NOTE — Telephone Encounter (Signed)
Pt called said she had not heard back from [facility]. Msg relayed to pt but she is inquiring if she should take dilantin before labs on 05/23/15. She said PCP labs she will be fasting. Please call her at [phone] and please leave VM.

## 2015-05-23 DIAGNOSIS — F419 Anxiety disorder, unspecified: Secondary | ICD-10-CM | POA: Diagnosis not present

## 2015-05-23 DIAGNOSIS — E538 Deficiency of other specified B group vitamins: Secondary | ICD-10-CM | POA: Diagnosis not present

## 2015-05-23 DIAGNOSIS — I10 Essential (primary) hypertension: Secondary | ICD-10-CM | POA: Diagnosis not present

## 2015-05-23 DIAGNOSIS — Z79899 Other long term (current) drug therapy: Secondary | ICD-10-CM | POA: Diagnosis not present

## 2015-05-23 DIAGNOSIS — R2689 Other abnormalities of gait and mobility: Secondary | ICD-10-CM | POA: Diagnosis not present

## 2015-05-23 DIAGNOSIS — M6281 Muscle weakness (generalized): Secondary | ICD-10-CM | POA: Diagnosis not present

## 2015-05-23 DIAGNOSIS — G4089 Other seizures: Secondary | ICD-10-CM | POA: Diagnosis not present

## 2015-05-23 DIAGNOSIS — D649 Anemia, unspecified: Secondary | ICD-10-CM | POA: Diagnosis not present

## 2015-05-23 DIAGNOSIS — E039 Hypothyroidism, unspecified: Secondary | ICD-10-CM | POA: Diagnosis not present

## 2015-05-23 DIAGNOSIS — E119 Type 2 diabetes mellitus without complications: Secondary | ICD-10-CM | POA: Diagnosis not present

## 2015-05-23 DIAGNOSIS — E782 Mixed hyperlipidemia: Secondary | ICD-10-CM | POA: Diagnosis not present

## 2015-05-23 NOTE — Telephone Encounter (Signed)
Patient called to advise that she went to Dr. Dewey/PCP to have dilantin level checked and states they didn't know a thing about her getting it checked there.

## 2015-05-23 NOTE — Telephone Encounter (Signed)
I called the patient. She stated that they did go ahead and draw her dilantin level while she was at Dr. Ival Bible office. She wanted [place] to be aware that they didn't have record of needing to draw it because she is worried that we will not receive the results. I promised her that we would follow up with Dr. Ival Bible office.

## 2015-05-24 ENCOUNTER — Telehealth: Payer: Self-pay | Admitting: Neurology

## 2015-05-24 NOTE — Telephone Encounter (Signed)
I spoke to [place] at Dr. Ival Bible office. She is going to send the patient's lab results once Dr. Ival Bible nurse has a chance to look at them.

## 2015-05-24 NOTE — Telephone Encounter (Signed)
The patient has had blood work done through her primary care physician, a CBC was normal, with white count of 5.5, hemoglobin 12.0, hematocrit of 35.5, platelets of 278. The chemistry profiles unremarkable, BUN was 9, creatinine of 0.68. Sodium 140. Liver profile was unremarkable. TSH of 2.87. Dilantin level was slightly low at 6.5 ,  vitamin B12 level of 595.

## 2015-05-24 NOTE — Telephone Encounter (Signed)
I received the Dilantin results (6.5). Results placed on Dr. Jannifer Franklin' desk.

## 2015-05-25 DIAGNOSIS — R2689 Other abnormalities of gait and mobility: Secondary | ICD-10-CM | POA: Diagnosis not present

## 2015-05-25 DIAGNOSIS — G4089 Other seizures: Secondary | ICD-10-CM | POA: Diagnosis not present

## 2015-05-25 DIAGNOSIS — I10 Essential (primary) hypertension: Secondary | ICD-10-CM | POA: Diagnosis not present

## 2015-05-25 DIAGNOSIS — E119 Type 2 diabetes mellitus without complications: Secondary | ICD-10-CM | POA: Diagnosis not present

## 2015-05-25 DIAGNOSIS — F419 Anxiety disorder, unspecified: Secondary | ICD-10-CM | POA: Diagnosis not present

## 2015-05-25 DIAGNOSIS — M6281 Muscle weakness (generalized): Secondary | ICD-10-CM | POA: Diagnosis not present

## 2015-05-26 DIAGNOSIS — E119 Type 2 diabetes mellitus without complications: Secondary | ICD-10-CM | POA: Diagnosis not present

## 2015-05-26 DIAGNOSIS — R2689 Other abnormalities of gait and mobility: Secondary | ICD-10-CM | POA: Diagnosis not present

## 2015-05-26 DIAGNOSIS — G4089 Other seizures: Secondary | ICD-10-CM | POA: Diagnosis not present

## 2015-05-26 DIAGNOSIS — F419 Anxiety disorder, unspecified: Secondary | ICD-10-CM | POA: Diagnosis not present

## 2015-05-26 DIAGNOSIS — I10 Essential (primary) hypertension: Secondary | ICD-10-CM | POA: Diagnosis not present

## 2015-05-26 DIAGNOSIS — M6281 Muscle weakness (generalized): Secondary | ICD-10-CM | POA: Diagnosis not present

## 2015-05-28 DIAGNOSIS — E1159 Type 2 diabetes mellitus with other circulatory complications: Secondary | ICD-10-CM | POA: Diagnosis not present

## 2015-05-28 DIAGNOSIS — I10 Essential (primary) hypertension: Secondary | ICD-10-CM | POA: Diagnosis not present

## 2015-05-28 DIAGNOSIS — E039 Hypothyroidism, unspecified: Secondary | ICD-10-CM | POA: Diagnosis not present

## 2015-05-28 DIAGNOSIS — E782 Mixed hyperlipidemia: Secondary | ICD-10-CM | POA: Diagnosis not present

## 2015-05-29 ENCOUNTER — Other Ambulatory Visit: Payer: Self-pay | Admitting: Endocrinology

## 2015-05-30 DIAGNOSIS — G4089 Other seizures: Secondary | ICD-10-CM | POA: Diagnosis not present

## 2015-05-30 DIAGNOSIS — F419 Anxiety disorder, unspecified: Secondary | ICD-10-CM | POA: Diagnosis not present

## 2015-05-30 DIAGNOSIS — E119 Type 2 diabetes mellitus without complications: Secondary | ICD-10-CM | POA: Diagnosis not present

## 2015-05-30 DIAGNOSIS — M6281 Muscle weakness (generalized): Secondary | ICD-10-CM | POA: Diagnosis not present

## 2015-05-30 DIAGNOSIS — R2689 Other abnormalities of gait and mobility: Secondary | ICD-10-CM | POA: Diagnosis not present

## 2015-05-30 DIAGNOSIS — I10 Essential (primary) hypertension: Secondary | ICD-10-CM | POA: Diagnosis not present

## 2015-06-07 ENCOUNTER — Telehealth: Payer: Self-pay | Admitting: Neurology

## 2015-06-07 NOTE — Telephone Encounter (Signed)
I called patient. The patient is running a low Dilantin level, she is taking 200 mg of Dilantin in the evening, 150 mg in the morning. She Proud have some transient itching without rash following her dosing. She Sarracino have some mild gait instability. She is not having seizures, however. I would not alter the dosing of the Dilantin, I would not switch to another medication.

## 2015-06-07 NOTE — Telephone Encounter (Signed)
Patient is calling to discuss lab results.

## 2015-06-07 NOTE — Telephone Encounter (Signed)
I called the patient. She stated that she knew her Dilantin level was lower than it should be (it was 6.5). She wondered if she needed to switch to a different medication. She states she cannot take more Dilantin because it makes her itch and have trouble walking.

## 2015-06-10 ENCOUNTER — Telehealth: Payer: Self-pay | Admitting: Neurology

## 2015-06-10 NOTE — Telephone Encounter (Signed)
[employer] called to get status on pts driving . Duerr call [place] ext [phone]. Thank you

## 2015-06-11 ENCOUNTER — Ambulatory Visit (INDEPENDENT_AMBULATORY_CARE_PROVIDER_SITE_OTHER): Payer: Medicare Other | Admitting: Podiatry

## 2015-06-11 ENCOUNTER — Encounter: Payer: Self-pay | Admitting: Podiatry

## 2015-06-11 DIAGNOSIS — L84 Corns and callosities: Secondary | ICD-10-CM

## 2015-06-11 DIAGNOSIS — E1142 Type 2 diabetes mellitus with diabetic polyneuropathy: Secondary | ICD-10-CM | POA: Diagnosis not present

## 2015-06-11 NOTE — Patient Instructions (Signed)
Diabetes and Foot Care Diabetes [place] cause you to have problems because of poor blood supply (circulation) to your feet and legs. This Raffo cause the skin on your feet to become thinner, break easier, and heal more slowly. Your skin Blume become dry, and the skin Recine peel and crack. You Mori also have nerve damage in your legs and feet causing decreased feeling in them. You Revell not notice minor injuries to your feet that could lead to infections or more serious problems. Taking care of your feet is one of the most important things you can do for yourself.  HOME CARE INSTRUCTIONS  Wear shoes at all times, even in the house. Do not go barefoot. Bare feet are easily injured.  Check your feet daily for blisters, cuts, and redness. If you cannot see the bottom of your feet, use a mirror or ask someone for help.  Wash your feet with warm water (do not use hot water) and mild soap. Then pat your feet and the areas between your toes until they are completely dry. Do not soak your feet as this can dry your skin.  Apply a moisturizing lotion or petroleum jelly (that does not contain alcohol and is unscented) to the skin on your feet and to dry, brittle toenails. Do not apply lotion between your toes.  Trim your toenails straight across. Do not dig under them or around the cuticle. File the edges of your nails with an emery board or nail file.  Do not cut corns or calluses or try to remove them with medicine.  Wear clean socks or stockings every day. Make sure they are not too tight. Do not wear knee-high stockings since they Depp decrease blood flow to your legs.  Wear shoes that fit properly and have enough cushioning. To break in new shoes, wear them for just a few hours a day. This prevents you from injuring your feet. Always look in your shoes before you put them on to be sure there are no objects inside.  Do not cross your legs. This [place] decrease the blood flow to your feet.  If you find a minor scrape,  cut, or break in the skin on your feet, keep it and the skin around it clean and dry. These areas [place] be cleansed with mild soap and water. Do not cleanse the area with peroxide, alcohol, or iodine.  When you remove an adhesive bandage, be sure not to damage the skin around it.  If you have a wound, look at it several times a day to make sure it is healing.  Do not use heating pads or hot water bottles. They Amodei burn your skin. If you have lost feeling in your feet or legs, you Studzinski not know it is happening until it is too late.  Make sure your health care provider performs a complete foot exam at least annually or more often if you have foot problems. Report any cuts, sores, or bruises to your health care provider immediately. SEEK MEDICAL CARE IF:   You have an injury that is not healing.  You have cuts or breaks in the skin.  You have an ingrown nail.  You notice redness on your legs or feet.  You feel burning or tingling in your legs or feet.  You have pain or cramps in your legs and feet.  Your legs or feet are numb.  Your feet always feel cold. SEEK IMMEDIATE MEDICAL CARE IF:   There is increasing redness,   swelling, or pain in or around a wound.  There is a red line that goes up your leg.  Pus is coming from a wound.  You develop a fever or as directed by your health care provider.  You notice a bad smell coming from an ulcer or wound.   This information is not intended to replace advice given to you by your health care provider. Make sure you discuss any questions you have with your health care provider.   Document Released: 04/17/2000 Document Revised: 12/21/2012 Document Reviewed: 09/27/2012 Elsevier Interactive Patient Education 2016 [employer]vier Inc.  

## 2015-06-11 NOTE — Telephone Encounter (Signed)
I called the patient and left voicemail asking her to call back. When she calls back, please remind her that she still needs to come in to have her Dilantin level checked.

## 2015-06-11 NOTE — Telephone Encounter (Signed)
I called Amber with [employer] and advised that, per Jinny Blossom, NP, the patient should not drive for now. I advised that the patient had a seizure-like event in December (as described by caregiver) and we advise patients not drive for 6 months following any seizure activity.

## 2015-06-11 NOTE — Telephone Encounter (Signed)
The patient did have an episode on December 29th that the caregiver labeled a seizure- unsure if this truly represented a seizure but for that reason I would recommend that she not drive for now. She was suppose to come in to have blood work for [place] to check her drug levels-- she has not done this. Please remind the patient that she should come in for blood work.

## 2015-06-11 NOTE — Progress Notes (Signed)
Patient ID: [name], female   DOB: Oct 15, 1929, 82 y.o.   MRN: [id number]  Subjective: This patient presents for ongoing debridement of pre-ulcerative plantar calluses right and left feet at approximately 6 week intervals. Patient is known diabetic with peripheral neuropathy. Patient has diabetic shoes at that on the wears them at home  Objective: Plantar first MPJ calluses have bleeding within the callus and remain closed after debridement. There is no surrounding erythema, edema or drainage surrounding these plantar calluses bilaterally  Assessment: Pre-ulcerative plantar calluses 2 Diabetic peripheral neuropathy  Plan: Debrided pre-ulcerative plantar calluses 2 Patient advised to wear diabetic shoes and ongoing regular basis  Reappoint 6 weeks

## 2015-06-12 DIAGNOSIS — D485 Neoplasm of uncertain behavior of skin: Secondary | ICD-10-CM | POA: Diagnosis not present

## 2015-06-12 DIAGNOSIS — Z85820 Personal history of malignant melanoma of skin: Secondary | ICD-10-CM | POA: Diagnosis not present

## 2015-06-12 DIAGNOSIS — L812 Freckles: Secondary | ICD-10-CM | POA: Diagnosis not present

## 2015-06-12 DIAGNOSIS — L821 Other seborrheic keratosis: Secondary | ICD-10-CM | POA: Diagnosis not present

## 2015-06-12 NOTE — Telephone Encounter (Signed)
I called and spoke to the patient. She stated she had her Dilantin level checked at her PCP's office. Please refer to phone note 06/07/15.

## 2015-06-20 ENCOUNTER — Telehealth: Payer: Self-pay | Admitting: Endocrinology

## 2015-06-20 NOTE — Telephone Encounter (Signed)
I contacted the pt. She would like for [place] to try and get the brand name Starlix approved through her insurance.  Please advise if we can try and get the medication approved.

## 2015-06-20 NOTE — Telephone Encounter (Signed)
Pt said she called [facility] because ever since they switched her medication to the Generic, her BP and Sugar levels have not been under control and that Dr. Loanne Drilling needs to send in an Rx that says brand name only so the insurance will pay for it.  The Pharmacy # is [phone]

## 2015-06-20 NOTE — Telephone Encounter (Signed)
Ok, i'll do PA 

## 2015-06-21 DIAGNOSIS — Z7689 Persons encountering health services in other specified circumstances: Secondary | ICD-10-CM

## 2015-06-21 NOTE — Telephone Encounter (Signed)
done

## 2015-06-21 NOTE — Telephone Encounter (Signed)
PA placed on your desk.  

## 2015-06-24 NOTE — Telephone Encounter (Signed)
PA faxed. Waiting on response.

## 2015-07-15 DIAGNOSIS — E782 Mixed hyperlipidemia: Secondary | ICD-10-CM | POA: Diagnosis not present

## 2015-07-15 DIAGNOSIS — F411 Generalized anxiety disorder: Secondary | ICD-10-CM | POA: Diagnosis not present

## 2015-07-15 DIAGNOSIS — K219 Gastro-esophageal reflux disease without esophagitis: Secondary | ICD-10-CM | POA: Diagnosis not present

## 2015-07-15 DIAGNOSIS — I10 Essential (primary) hypertension: Secondary | ICD-10-CM | POA: Diagnosis not present

## 2015-07-16 ENCOUNTER — Ambulatory Visit (INDEPENDENT_AMBULATORY_CARE_PROVIDER_SITE_OTHER): Payer: Medicare Other | Admitting: Podiatry

## 2015-07-16 ENCOUNTER — Encounter: Payer: Self-pay | Admitting: Podiatry

## 2015-07-16 VITALS — BP 144/59 | HR 63 | Temp 96.4°F | Resp 14

## 2015-07-16 DIAGNOSIS — L84 Corns and callosities: Secondary | ICD-10-CM

## 2015-07-16 DIAGNOSIS — E1142 Type 2 diabetes mellitus with diabetic polyneuropathy: Secondary | ICD-10-CM

## 2015-07-16 NOTE — Patient Instructions (Signed)
Today there were is irritated corn on top of your second left toe. Please apply topical antibiotic ointment such as Polysporin and a Band-Aid daily until the skin looks normal. Please where your diabetic shoes or a soft shoes. If you notice any sudden increase in pain, swelling, redness, warmth, drainage present to emergency department or contact office  Diabetes and Foot Care Diabetes Casimir cause you to have problems because of poor blood supply (circulation) to your feet and legs. This Crooke cause the skin on your feet to become thinner, break easier, and heal more slowly. Your skin Burback become dry, and the skin Molnar peel and crack. You Riling also have nerve damage in your legs and feet causing decreased feeling in them. You Hinkle not notice minor injuries to your feet that could lead to infections or more serious problems. Taking care of your feet is one of the most important things you can do for yourself.  HOME CARE INSTRUCTIONS  Wear shoes at all times, even in the house. Do not go barefoot. Bare feet are easily injured.  Check your feet daily for blisters, cuts, and redness. If you cannot see the bottom of your feet, use a mirror or ask someone for help.  Wash your feet with warm water (do not use hot water) and mild soap. Then pat your feet and the areas between your toes until they are completely dry. Do not soak your feet as this can dry your skin.  Apply a moisturizing lotion or petroleum jelly (that does not contain alcohol and is unscented) to the skin on your feet and to dry, brittle toenails. Do not apply lotion between your toes.  Trim your toenails straight across. Do not dig under them or around the cuticle. File the edges of your nails with an emery board or nail file.  Do not cut corns or calluses or try to remove them with medicine.  Wear clean socks or stockings every day. Make sure they are not too tight. Do not wear knee-high stockings since they Jantz decrease blood flow to your  legs.  Wear shoes that fit properly and have enough cushioning. To break in new shoes, wear them for just a few hours a day. This prevents you from injuring your feet. Always look in your shoes before you put them on to be sure there are no objects inside.  Do not cross your legs. This Eimer decrease the blood flow to your feet.  If you find a minor scrape, cut, or break in the skin on your feet, keep it and the skin around it clean and dry. These areas Kuzel be cleansed with mild soap and water. Do not cleanse the area with peroxide, alcohol, or iodine.  When you remove an adhesive bandage, be sure not to damage the skin around it.  If you have a wound, look at it several times a day to make sure it is healing.  Do not use heating pads or hot water bottles. They Teat burn your skin. If you have lost feeling in your feet or legs, you Moye not know it is happening until it is too late.  Make sure your health care provider performs a complete foot exam at least annually or more often if you have foot problems. Report any cuts, sores, or bruises to your health care provider immediately. SEEK MEDICAL CARE IF:   You have an injury that is not healing.  You have cuts or breaks in the skin.  You  have an ingrown nail.  You notice redness on your legs or feet.  You feel burning or tingling in your legs or feet.  You have pain or cramps in your legs and feet.  Your legs or feet are numb.  Your feet always feel cold. SEEK IMMEDIATE MEDICAL CARE IF:   There is increasing redness, swelling, or pain in or around a wound.  There is a red line that goes up your leg.  Pus is coming from a wound.  You develop a fever or as directed by your health care provider.  You notice a bad smell coming from an ulcer or wound.   This information is not intended to replace advice given to you by your health care provider. Make sure you discuss any questions you have with your health care provider.    Document Released: 04/17/2000 Document Revised: 12/21/2012 Document Reviewed: 09/27/2012 Elsevier Interactive Patient Education [employer].

## 2015-07-16 NOTE — Progress Notes (Signed)
Patient ID: [name], female   DOB: 11-May-1929, 82 y.o.   MRN: [id number]  Subjective: This patient presents again at six-week intervals for debridement of pre-ulcerative plantar callus first MPJ bilaterally. She has been instructed to wear diabetic shoes, however, she admits she only wears them occasionally or in the house. Also in the last several weeks patient is noticed a corn on the second left toe which is applied Polysporin to the area.  Objective: Orientated 3 Patient wearing high-heeled shoes Hammertoe 2-5 bilaterally Eschar dorsal PIPJ without any surrounding erythema, edema, drainage Bleeding callus plantar first MPJ bilaterally DP and PT pulses 2/4 bilaterally Decreased sensation to Semmes monofilament wire  Assessment: Diabetic peripheral neuropathy Noncompliance of patient and she continues to wear dress shoes or nondiabetic shoes Pre-ulcerative noninfected eschar dorsal second left MPJ Pre-ulcerative plantar callus first MPJ bilaterally  Plan: Today I advised the patient strongly to wear diabetic shoes at all times. I recommended she apply topical antibiotic ointment and Band-Aid to the second right toe daily until the skin looks normal. She was advised she develops any sudden redness, swelling, pain present to ED our office  The pre-ulcerative plantar callus first MPJ were debrided, bilaterally without a bleeding  Reappoint 6 weeks

## 2015-07-22 ENCOUNTER — Ambulatory Visit: Payer: Medicare Other | Admitting: Internal Medicine

## 2015-08-12 ENCOUNTER — Encounter: Payer: Self-pay | Admitting: Adult Health

## 2015-08-12 ENCOUNTER — Ambulatory Visit (INDEPENDENT_AMBULATORY_CARE_PROVIDER_SITE_OTHER): Payer: Medicare Other | Admitting: Adult Health

## 2015-08-12 VITALS — BP 152/92 | HR 92 | Ht 67.75 in | Wt 133.8 lb

## 2015-08-12 DIAGNOSIS — Z8603 Personal history of neoplasm of uncertain behavior: Secondary | ICD-10-CM

## 2015-08-12 DIAGNOSIS — Z9889 Other specified postprocedural states: Secondary | ICD-10-CM

## 2015-08-12 DIAGNOSIS — R569 Unspecified convulsions: Secondary | ICD-10-CM

## 2015-08-12 NOTE — Patient Instructions (Signed)
Continue Dilantin If your symptoms worsen or you develop new symptoms please let us know.   

## 2015-08-12 NOTE — Progress Notes (Signed)
[name]: [name] DOB: [birth year]  REASON FOR VISIT: follow up- history of meningioma, subsequent seizures HISTORY FROM: [name]  HISTORY OF PRESENT ILLNESS: [name] is an 82 year old female with a history of meningioma resection, encephalomalacia and subsequent seizures. She returns today for follow-up. She is currently on Dilantin taking 200 mg in Yolanda morning and 150 mg in Yolanda evening. She states that she is tolerating this well. She denies any seizure events. She is currently not operating a motor vehicle. She was instructed that she cannot drive until June providing that she was seizure free. She lives at home alone. Although she does have assistance during Yolanda day. She is able to complete all ADLs independently. She prepares most of her meals without difficulty. She denies any changes with her gait or balance. Denies any falls. She states occasionally she will feel dizzy but that is intermittent. She states that she continues to have some discomfort at Yolanda incision site but nothing severe. She also reports a "knot" in Yolanda occipital region. This has not changed in size and is not sensitive or  causes her any discomfort. She returns today for an evaluation.  HISTORY 05/14/15 (MM): [name] is an 82 year old female with a history of meningioma resection, encephalomalacia and subsequent seizures. She returns today for follow-up. [name] is currently on Dilantin and tolerating it well. [name] denies any seizure events however she states that on December 29 her caregiver had a hard time waking her up. She states that she went to her bedroom and laid across Yolanda bed to place eyedrops in her eyes. She became very sleepy and went to sleep. When he did arouse her she moved to a another room with him but then went right back to sleep. He called EMS who evaluated [name]. She did not go to Yolanda hospital. There was no witnessed seizure activity. She denies biting her tongue or loss of Yolanda bladder or  bowels. [name] states that she did not sleep well Yolanda night before so she feels that she was just sleepy. She denies missing any of her medications. She states that she was sick during this time. [name] states that she currently takes Dilantin 150 mg in Yolanda morning and 200 mg in Yolanda evening. She does state that she has issues with her balance but this has been ongoing. She reports that she has a "knot" in Yolanda back of Yolanda head as well as in Yolanda right frontal region where her incision is. She denies any discomfort/pain. She denies any neurological changes. She reports that she recently was diagnosed with hyperthyroidism and was started on Synthroid. She returns today for an evaluation.  HISTORY 02/06/15 ([name]): [name] is an 82 year old right-handed white female with a history of meningioma resection, encephalomalacia, and subsequent seizures. [name] has been placed on a multitude of anti-epileptic medications, she could not tolerate many of them. [name] has been placed on Dilantin, she seems to be doing fairly well with this. She recently was increased on Yolanda Dilantin from 200 mg daily to 300 mg daily with a blood level of 2.9. She felt poorly on this dose increase, feeling somewhat depressed and irritable. [name] indicates that she did not miss a dose, she was seen in Yolanda emergency room as she felt bad on 02/04/2015. [name] had a blood level of 3.0 with Yolanda Dilantin 2 weeks after she increased Yolanda medication. She indicates that she went home that evening, and  suffered a seizure while asleep in bed, she woke up on Yolanda floor, she had bowel incontinence. [name] returns for an evaluation.  REVIEW OF SYSTEMS: Out of a complete 14 system review of symptoms, [name] complains only of Yolanda following symptoms, and all other reviewed systems are negative.  See history of present illness   ALLERGIES: Allergies  Allergen Reactions  . Bystolic [Nebivolol Hcl] Other (See Comments)     "extreme weakness, heaviness in legs & arms, swelling in legs/arms/face, swollen abdomen, pain in bladder, feet pain, soreness in chest"  . Cholestyramine     "itching rash on stomach, bloated, nausea, vomiting, sleeplessness, extreme pain in arms"  . Hydrazine Yellow [Tartrazine] Other (See Comments)    "does not reduce high blood pressure, pain in arm, high pressure, felt like I was on verge of heart attack, really weak"  . Morphine Other (See Comments)    Feels morbid, weak, still in pain  . Niacin Palpitations    Fast heart beat  . Niaspan [Niacin Er] Palpitations and Other (See Comments)    "fast heart beat, high blood pressure"  . Norvasc [Amlodipine Besylate] Other (See Comments)    "extreme fluid retention/pain)  . Optivar [Azelastine Hcl] Photosensitivity  . Sular [Nisoldipine Er] Other (See Comments)    "severe headaches, swelling eyes, hands, feet, shortness of breath, weak, flushed face, brain boiling, fluid retention, high blood sugar, nervous, heart fast beating"  . Tegretol [Carbamazepine] Other (See Comments)    Blood poisoning   . Telmisartan Other (See Comments)    "headache, difficulty urinating, high blood sugar, fluid retention"  . Cefdinir Swelling    Vaginal irritation, breathing,   . Clonidine Other (See Comments)    Dry mouth, fluid retention  . Clonidine Hydrochloride Other (See Comments)    Dry mouth, fluid retention  . Codeine Nausea And Vomiting  . Ezetimibe Other (See Comments)    Made weak  . Naproxen Other (See Comments)    Shrinks bladder  . Ziac [Bisoprolol-Hydrochlorothiazide] Other (See Comments)    "stopped urination"  . Elavil [Amitriptyline] Other (See Comments)    Gave Pt nightmares  . Keppra [Levetiracetam]     Shaking  . Lamictal [Lamotrigine]     itching  . Lyrica [Pregabalin] Swelling  . Topamax [Topiramate]     Dry eyes  . Ace Inhibitors Other (See Comments)    unknown  . Actonel [Risedronate Sodium] Other (See Comments)     unknown  . Amlodipine Besylate Other (See Comments)    unknown  . Aspirin Other (See Comments)    unknown  . Atacand [Candesartan] Other (See Comments)    unknown  . Bextra [Valdecoxib] Other (See Comments)    unknown  . Bisoprolol-Hydrochlorothiazide Other (See Comments)    unknown  . Candesartan Cilexetil Other (See Comments)    unknown  . Cefadroxil Other (See Comments)    unknown  . Celecoxib Rash  . Hydrocodone Other (See Comments)    unknown  . Hydrocodone-Acetaminophen Other (See Comments)    unknown  . Iodinated Diagnostic Agents Rash    "All over"  . Meloxicam Other (See Comments)    unknown  . Methylprednisolone Sodium Succinate Other (See Comments)    unknown  . Nabumetone Other (See Comments)    unknown  . Penicillins Other (See Comments)    unknown  . Pseudoephedrine-Guaifenesin Other (See Comments)    unknown  . Risedronate Sodium Other (See Comments)    unknown  . Rofecoxib Other (  See Comments)    unknown  . Ru-Tuss [Chlorphen-Pse-Atrop-Hyos-Scop] Other (See Comments)    unknown  . Sulfonamide Derivatives Other (See Comments)    unknown  . Sulphur [Sulfur] Other (See Comments)    unknown  . Telithromycin Other (See Comments)    unknown  . Terfenadine Other (See Comments)    unknown  . Trandolapril-Verapamil Hcl Er Other (See Comments)    Headache, difficulty urinating, high blood sugar, fluid retention  . Valium [Diazepam] Other (See Comments)    Makes her mean and hyper    HOME MEDICATIONS: Outpatient Prescriptions Prior to Visit  Medication Sig Dispense Refill  . acetaminophen (TYLENOL) 500 MG tablet Take 1,000 mg by mouth every 6 (six) hours as needed for pain.    Marland Kitchen ALPRAZolam (XANAX) 0.25 MG tablet Take 1 tablet (0.25 mg total) by mouth at bedtime. 30 tablet 1  . calcium carbonate (OS-CAL) 600 MG TABS Take 600 mg by mouth daily.     . colestipol (COLESTID) 1 G tablet Take 2 g by mouth 2 (two) times daily.    . cyanocobalamin (,VITAMIN  B-12,) 1000 MCG/ML injection Inject 1 mL (1,000 mcg total) into Yolanda muscle every 21 ( twenty-one) days. 3 mL 3  . fexofenadine (ALLEGRA) 60 MG tablet Take 60 mg by mouth daily as needed for allergies.     . furosemide (LASIX) 20 MG tablet Take 1 tablet (20 mg total) by mouth daily. Do not take until I call you about labs. ([name] taking differently: Take 20 mg by mouth daily as needed for fluid or edema. Do not take until I call you about labs.) 30 tablet 0  . lidocaine (LIDODERM) 5 % Place 1 patch onto Yolanda skin daily. Remove & Discard patch within 12 hours or as directed by MD ([name] taking differently: Place 1 patch onto Yolanda skin daily as needed (pain). Remove & Discard patch within 12 hours or as directed by MD) 90 patch 1  . metFORMIN (GLUCOPHAGE-XR) 500 MG 24 hr tablet TAKE 4 TABLETS DAILY 360 tablet 2  . ondansetron (ZOFRAN) 4 MG tablet Take 1 tablet (4 mg total) by mouth every 6 (six) hours. 12 tablet 0  . phenytoin (DILANTIN) 100 MG ER capsule Take 3 capsules (300 mg total) by mouth at bedtime. ([name] taking differently: Take 200 mg by mouth at bedtime. ) 270 capsule 1  . phenytoin (DILANTIN) 50 MG tablet Chew 1 tablet (50 mg total) by mouth daily. ([name] taking differently: Chew 150 mg by mouth daily. ) 90 tablet 1  . Polyethyl Glycol-Propyl Glycol 0.4-0.3 % SOLN Place 2 drops into both eyes at bedtime.     . ranitidine (ZANTAC) 150 MG capsule Take 150 mg by mouth daily as needed for heartburn.     . repaglinide (PRANDIN) 0.5 MG tablet Take 1 tablet (0.5 mg total) by mouth 3 (three) times daily before meals. 90 tablet 11  . TARKA 2-180 MG per tablet TAKE 1 TABLET DAILY 90 tablet 1   No facility-administered medications prior to visit.    PAST MEDICAL HISTORY: Past Medical History  Diagnosis Date  . Seasonal allergies   . HTN (hypertension)   . Osteoarthritis   . Palpitations   . Anemia   . History of colonic polyps   . GERD (gastroesophageal reflux disease)   .  Hyperlipidemia   . Chronic facial pain   . Vitamin B12 deficiency   . Dyslipidemia   . Chronic foot pain   . Chronic cough   .  Obesity   . Anxiety   . Complication of anesthesia     Sore jaw; could not chew or move mouth  . Diabetes mellitus     type 2 niddm x 20 years  . Metatarsal bone fracture 2014  . Hammer toe     bilateral  . Hx of radiation therapy 03/07/13- 03/29/13    right breast 4250 cGy 17 sessions  . Ejection fraction   . Skin cancer   . Melanoma ([place])   . Cancer of right breast ([facility]) 12/26/12    right breast 12:00 o'clock, DCIS  . Convulsions/seizures ([place]) 10/16/2014    PAST SURGICAL HISTORY: Past Surgical History  Procedure Laterality Date  . Colonoscopy    . Polypectomy      small adenomatous  . Abdominal hysterectomy    . Cholecystectomy    . Hernia repair    . Knee arthroscopy Bilateral   . Ulnar tunnel release    . Eye surgery    . Cataract extraction w/ intraocular lens implant Right   . Bunionectomy    . Breast lumpectomy with needle localization Right 01/17/2013    Procedure: BREAST LUMPECTOMY WITH NEEDLE LOCALIZATION;  Surgeon: Edward Jolly, MD;  Location: [facility];  Service: General;  Laterality: Right;  . Breast biopsy Right 01/24/2013    Procedure: RE-EXCICION OF BREAST CANCER, ANTERIOR MARGINS;  Surgeon: Edward Jolly, MD;  Location: [facility];  Service: General;  Laterality: Right;  . Craniotomy Right 10/18/2013    Procedure: CRANIOTOMY TUMOR EXCISION;  Surgeon: Floyce Stakes, MD;  Location: [name];  Service: Neurosurgery;  Laterality: Right;    FAMILY HISTORY: Family History  Problem Relation Age of Onset  . Colon cancer    . Heart disease Mother   . Diabetes Father   . Pancreatic cancer Father   . Bone cancer Sister   . Prostate cancer Brother   . Colon cancer Brother   . Rectal cancer Sister   . Thyroid disease Sister     benign goiter resected    SOCIAL HISTORY: Social History   Social History  . Marital  Status: Widowed    Spouse Name: N/A  . Number of Children: 0  . Years of Education: college   Occupational History  . retired    Social History Main Topics  . Smoking status: Never Smoker   . Smokeless tobacco: Never Used  . Alcohol Use: No  . Drug Use: No  . Sexual Activity: Yes     Comment: menarche age 24, hysterectomy age 86, HRT x 2-3 mos, G1- miscarriage   Other Topics Concern  . Not on file   Social History Narrative   Widowed, lives alone.  Ambulates independently.    Drinks decaf only   [name] is right handed.      PHYSICAL EXAM  Filed Vitals:   08/12/15 1116  BP: 152/92  Pulse: 92  Height: 5' 7.75" (1.721 m)  Weight: 133 lb 12.8 oz (60.691 kg)   Body mass index is 20.49 kg/(m^2).  Generalized: Well developed, in no acute distress  HEENT: Small nodule in Yolanda right parietal region of Yolanda scalp. No erythema or pain to palpitation. Small nodule in Yolanda right frontal region near incision scar. No erythema.   Neurological examination  Mentation: Alert oriented to time, place, history taking. Follows all commands speech and language fluent Cranial nerve II-XII: Pupils were equal round reactive to light. Extraocular movements were full, visual field were full on confrontational  test. Facial sensation and strength were normal. Uvula tongue midline. Head turning and shoulder shrug  were normal and symmetric. Motor: Yolanda motor testing reveals 5 over 5 strength of all 4 extremities. Good symmetric motor tone is noted throughout.  Sensory: Sensory testing is intact to soft touch on all 4 extremities. No evidence of extinction is noted.  Coordination: Cerebellar testing reveals good finger-nose-finger and heel-to-shin bilaterally.  Gait and station: Gait is normal. Tandem gait is normal. Romberg is negative. No drift is seen.  Reflexes: Deep tendon reflexes are symmetric and normal bilaterally.   DIAGNOSTIC DATA (LABS, IMAGING, TESTING) - I reviewed [name] records,  labs, notes, testing and imaging myself where available.  Lab Results  Component Value Date   WBC 6.1 01/16/2015   HGB 11.6* 11/07/2014   HCT 34.0 01/16/2015   MCV 90 01/16/2015   PLT 257 01/16/2015      Component Value Date/Time   NA 132* 02/04/2015 2000   NA 143 01/16/2015 1229   NA 138 01/04/2013 1212   K 4.0 02/04/2015 2000   K 3.9 01/04/2013 1212   CL 100* 02/04/2015 2000   CO2 23 02/04/2015 2000   CO2 22 01/04/2013 1212   GLUCOSE 123* 02/04/2015 2000   GLUCOSE 138* 01/16/2015 1229   GLUCOSE 285* 01/04/2013 1212   BUN 13 02/04/2015 2000   BUN 9 01/16/2015 1229   BUN 10.3 01/04/2013 1212   CREATININE 0.85 02/04/2015 2000   CREATININE 0.78 12/01/2013 1659   CREATININE 1.0 01/04/2013 1212   CALCIUM 8.3* 02/04/2015 2000   CALCIUM 9.6 01/04/2013 1212   PROT 5.4* 02/04/2015 2000   PROT 5.8* 01/16/2015 1229   PROT 6.4 01/04/2013 1212   ALBUMIN 3.5 02/04/2015 2000   ALBUMIN 4.0 01/16/2015 1229   ALBUMIN 3.6 01/04/2013 1212   AST 20 02/04/2015 2000   AST 14 01/04/2013 1212   ALT 21 02/04/2015 2000   ALT 16 01/04/2013 1212   ALKPHOS 82 02/04/2015 2000   ALKPHOS 52 01/04/2013 1212   BILITOT 0.4 02/04/2015 2000   BILITOT <0.2 01/16/2015 1229   BILITOT 1.01 01/04/2013 1212   GFRNONAA >60 02/04/2015 2000   GFRAA >60 02/04/2015 2000        ASSESSMENT AND PLAN 82 y.o. year old female  has a past medical history of Seasonal allergies; HTN (hypertension); Osteoarthritis; Palpitations; Anemia; History of colonic polyps; GERD (gastroesophageal reflux disease); Hyperlipidemia; Chronic facial pain; Vitamin B12 deficiency; Dyslipidemia; Chronic foot pain; Chronic cough; Obesity; Anxiety; Complication of anesthesia; Diabetes mellitus; Metatarsal bone fracture (2014); Hammer toe; radiation therapy (03/07/13- 03/29/13); Ejection fraction; Skin cancer; Melanoma ([place]); Cancer of right breast ([place]) (12/26/12); and Convulsions/seizures ([place]) (10/16/2014). here with:  1. Seizures 2.  History of meningioma  Overall [name] is doing well. She has not had any subsequent seizures. She will continue Dilantin. She recently had levels checked in February and no changes were made to her dosing. [name] should not drive until she's been seizure-free for 6 months-- which will be in June. [name] advised that she has any seizures she she'll let us know. She will follow-up in 6 months with Dr. Jannifer [place].     Ward Givens, MSN, NP-C 08/12/2015, 11:40 AM [facility] [address] [place],  [address] [phone]

## 2015-08-12 NOTE — Progress Notes (Signed)
I have read the note, and I agree with the clinical assessment and plan.  [name],[name]   

## 2015-08-18 ENCOUNTER — Encounter (HOSPITAL_COMMUNITY): Payer: Self-pay | Admitting: Nurse Practitioner

## 2015-08-18 ENCOUNTER — Emergency Department (HOSPITAL_COMMUNITY)
Admission: EM | Admit: 2015-08-18 | Discharge: 2015-08-19 | Disposition: A | Discharge status: Home / Self Care | Payer: Medicare Other | Attending: Emergency Medicine | Admitting: Emergency Medicine

## 2015-08-18 DIAGNOSIS — S199XXA Unspecified injury of neck, initial encounter: Secondary | ICD-10-CM | POA: Diagnosis not present

## 2015-08-18 DIAGNOSIS — Z79899 Other long term (current) drug therapy: Secondary | ICD-10-CM | POA: Diagnosis not present

## 2015-08-18 DIAGNOSIS — Z880 Allergy status to penicillin: Secondary | ICD-10-CM | POA: Insufficient documentation

## 2015-08-18 DIAGNOSIS — M1990 Unspecified osteoarthritis, unspecified site: Secondary | ICD-10-CM | POA: Insufficient documentation

## 2015-08-18 DIAGNOSIS — Z862 Personal history of diseases of the blood and blood-forming organs and certain disorders involving the immune mechanism: Secondary | ICD-10-CM | POA: Insufficient documentation

## 2015-08-18 DIAGNOSIS — E669 Obesity, unspecified: Secondary | ICD-10-CM | POA: Insufficient documentation

## 2015-08-18 DIAGNOSIS — Z7984 Long term (current) use of oral hypoglycemic drugs: Secondary | ICD-10-CM | POA: Diagnosis not present

## 2015-08-18 DIAGNOSIS — E119 Type 2 diabetes mellitus without complications: Secondary | ICD-10-CM | POA: Diagnosis not present

## 2015-08-18 DIAGNOSIS — I10 Essential (primary) hypertension: Secondary | ICD-10-CM | POA: Insufficient documentation

## 2015-08-18 DIAGNOSIS — Z86010 Personal history of colonic polyps: Secondary | ICD-10-CM | POA: Insufficient documentation

## 2015-08-18 DIAGNOSIS — Y9389 Activity, other specified: Secondary | ICD-10-CM | POA: Insufficient documentation

## 2015-08-18 DIAGNOSIS — Y9289 Other specified places as the place of occurrence of the external cause: Secondary | ICD-10-CM | POA: Diagnosis not present

## 2015-08-18 DIAGNOSIS — S4991XA Unspecified injury of right shoulder and upper arm, initial encounter: Secondary | ICD-10-CM | POA: Diagnosis present

## 2015-08-18 DIAGNOSIS — Z853 Personal history of malignant neoplasm of breast: Secondary | ICD-10-CM | POA: Insufficient documentation

## 2015-08-18 DIAGNOSIS — Y998 Other external cause status: Secondary | ICD-10-CM | POA: Insufficient documentation

## 2015-08-18 DIAGNOSIS — G8929 Other chronic pain: Secondary | ICD-10-CM | POA: Insufficient documentation

## 2015-08-18 DIAGNOSIS — W010XXA Fall on same level from slipping, tripping and stumbling without subsequent striking against object, initial encounter: Secondary | ICD-10-CM | POA: Insufficient documentation

## 2015-08-18 DIAGNOSIS — S59911A Unspecified injury of right forearm, initial encounter: Secondary | ICD-10-CM | POA: Diagnosis not present

## 2015-08-18 DIAGNOSIS — S0990XA Unspecified injury of head, initial encounter: Secondary | ICD-10-CM | POA: Diagnosis not present

## 2015-08-18 DIAGNOSIS — F419 Anxiety disorder, unspecified: Secondary | ICD-10-CM | POA: Insufficient documentation

## 2015-08-18 DIAGNOSIS — S42291A Other displaced fracture of upper end of right humerus, initial encounter for closed fracture: Secondary | ICD-10-CM | POA: Insufficient documentation

## 2015-08-18 DIAGNOSIS — K219 Gastro-esophageal reflux disease without esophagitis: Secondary | ICD-10-CM | POA: Diagnosis not present

## 2015-08-18 DIAGNOSIS — Z85820 Personal history of malignant melanoma of skin: Secondary | ICD-10-CM | POA: Insufficient documentation

## 2015-08-18 DIAGNOSIS — M25511 Pain in right shoulder: Secondary | ICD-10-CM | POA: Diagnosis not present

## 2015-08-18 DIAGNOSIS — Z923 Personal history of irradiation: Secondary | ICD-10-CM | POA: Insufficient documentation

## 2015-08-18 DIAGNOSIS — S42211A Unspecified displaced fracture of surgical neck of right humerus, initial encounter for closed fracture: Secondary | ICD-10-CM | POA: Diagnosis not present

## 2015-08-18 DIAGNOSIS — T148 Other injury of unspecified body region: Secondary | ICD-10-CM | POA: Diagnosis not present

## 2015-08-18 NOTE — ED Notes (Signed)
Pt c/o right shoulder pain fall after she skid on the floor. Currently guarding and splitting it, minimal range of motion, unable to assess obvious displacement.

## 2015-08-18 NOTE — ED Notes (Signed)
Bed: [id number] Expected date:  Expected time:  Means of arrival:  Comments: EMS 82yo F / fall shoulder pain

## 2015-08-19 ENCOUNTER — Encounter (HOSPITAL_COMMUNITY): Payer: Self-pay | Admitting: Emergency Medicine

## 2015-08-19 ENCOUNTER — Emergency Department (HOSPITAL_COMMUNITY): Payer: Medicare Other

## 2015-08-19 DIAGNOSIS — M25511 Pain in right shoulder: Secondary | ICD-10-CM | POA: Diagnosis not present

## 2015-08-19 DIAGNOSIS — S42291A Other displaced fracture of upper end of right humerus, initial encounter for closed fracture: Secondary | ICD-10-CM | POA: Diagnosis not present

## 2015-08-19 DIAGNOSIS — S0990XA Unspecified injury of head, initial encounter: Secondary | ICD-10-CM | POA: Diagnosis not present

## 2015-08-19 DIAGNOSIS — S199XXA Unspecified injury of neck, initial encounter: Secondary | ICD-10-CM | POA: Diagnosis not present

## 2015-08-19 MED ORDER — FENTANYL CITRATE (PF) 100 MCG/2ML IJ SOLN
50.0000 ug | INTRAMUSCULAR | Status: AC | PRN
  Administered 2015-08-19 (×2): 50 ug via INTRAVENOUS
  Filled 2015-08-19 (×2): qty 2

## 2015-08-19 MED ORDER — LIDOCAINE 5 % EX PTCH
1.0000 | MEDICATED_PATCH | CUTANEOUS | Status: DC
  Administered 2015-08-19: 1 via TRANSDERMAL
  Filled 2015-08-19 (×2): qty 1

## 2015-08-19 MED ORDER — KETOROLAC TROMETHAMINE 30 MG/ML IJ SOLN
30.0000 mg | Freq: Once | INTRAMUSCULAR | Status: AC
  Administered 2015-08-19: 30 mg via INTRAVENOUS
  Filled 2015-08-19: qty 1

## 2015-08-19 MED ORDER — FENTANYL CITRATE (PF) 100 MCG/2ML IJ SOLN
50.0000 ug | Freq: Once | INTRAMUSCULAR | Status: AC
  Administered 2015-08-19: 50 ug via INTRAVENOUS
  Filled 2015-08-19: qty 2

## 2015-08-19 MED ORDER — ACETAMINOPHEN 500 MG PO TABS
1000.0000 mg | ORAL_TABLET | Freq: Once | ORAL | Status: DC
  Filled 2015-08-19: qty 2

## 2015-08-19 MED ORDER — LIDOCAINE 5 % EX PTCH: 1.0000 | MEDICATED_PATCH | CUTANEOUS | Status: DC

## 2015-08-19 MED ORDER — ACETAMINOPHEN 500 MG PO TABS
1000.0000 mg | ORAL_TABLET | Freq: Once | ORAL | Status: AC
  Administered 2015-08-19: 1000 mg via ORAL
  Filled 2015-08-19: qty 2

## 2015-08-19 MED ORDER — OXYCODONE-ACETAMINOPHEN 5-325 MG PO TABS
1.0000 | ORAL_TABLET | Freq: Once | ORAL | Status: DC
  Filled 2015-08-19: qty 1

## 2015-08-19 NOTE — ED Notes (Signed)
Dr.Palaumbo notified of pt refusal to take Percocet due to being told by another physician that she should not take that medication. Pt did not know what reaction was caused by medication.

## 2015-08-19 NOTE — ED Notes (Signed)
Pt reports understanding of discharge information. No questions at time of discharge 
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
## 2015-08-26 ENCOUNTER — Other Ambulatory Visit: Payer: Self-pay | Admitting: Endocrinology

## 2015-08-26 ENCOUNTER — Telehealth: Payer: Self-pay

## 2015-08-26 DIAGNOSIS — S42294D Other nondisplaced fracture of upper end of right humerus, subsequent encounter for fracture with routine healing: Secondary | ICD-10-CM | POA: Diagnosis not present

## 2015-08-26 MED ORDER — NATEGLINIDE 120 MG PO TABS: ORAL_TABLET | ORAL | Status: DC

## 2015-08-26 NOTE — Telephone Encounter (Signed)
i sent rx refill

## 2015-08-26 NOTE — Telephone Encounter (Signed)
Pt is requesting a refill on Starlix 120 mg. There are two different directions on the medication instructions at this time. Could you review and advise how the pt should take the medication. Thanks!

## 2015-08-27 ENCOUNTER — Ambulatory Visit: Payer: Medicare Other | Admitting: Podiatry

## 2015-08-29 ENCOUNTER — Telehealth: Payer: Self-pay | Admitting: Endocrinology

## 2015-08-30 ENCOUNTER — Ambulatory Visit: Payer: Medicare Other | Admitting: Endocrinology

## 2015-09-03 ENCOUNTER — Encounter: Payer: Self-pay | Admitting: Endocrinology

## 2015-09-03 ENCOUNTER — Ambulatory Visit (INDEPENDENT_AMBULATORY_CARE_PROVIDER_SITE_OTHER): Payer: Medicare Other | Admitting: Endocrinology

## 2015-09-03 VITALS — BP 146/94 | HR 116 | Temp 97.6°F | Ht 67.5 in | Wt 132.0 lb

## 2015-09-03 DIAGNOSIS — E1151 Type 2 diabetes mellitus with diabetic peripheral angiopathy without gangrene: Secondary | ICD-10-CM

## 2015-09-03 LAB — POCT GLYCOSYLATED HEMOGLOBIN (HGB A1C): Hemoglobin A1C: 8.2

## 2015-09-03 MED ORDER — SITAGLIPTIN PHOSPHATE 100 MG PO TABS: 100.0000 mg | ORAL_TABLET | Freq: Every day | ORAL | Status: DC

## 2015-09-03 MED ORDER — LINAGLIPTIN 5 MG PO TABS: 5.0000 mg | ORAL_TABLET | Freq: Every day | ORAL | Status: DC

## 2015-09-03 MED ORDER — NATEGLINIDE 120 MG PO TABS: 120.0000 mg | ORAL_TABLET | Freq: Three times a day (TID) | ORAL | Status: DC

## 2015-09-03 NOTE — Patient Instructions (Addendum)
i have sent a prescription to your pharmacy,  For an additional diabetes medication.    Please come back for a follow-up appointment in 3 months.   check your blood sugar once a day.  vary the time of day when you check, between before the 3 meals, and at bedtime.  also check if you have symptoms of your blood sugar being too high or too low.  please keep a record of the readings and bring it to your next appointment here (or you can bring the meter itself).  You can write it on any piece of paper.  please call us sooner if your blood sugar goes below 70, or if you have a lot of readings over 200.

## 2015-09-03 NOTE — Progress Notes (Signed)
Subjective:    Patient ID: [name], female    DOB: Apr 11, 1930, 82 y.o.   MRN: [id number]  HPI Pt returns for f/u of multinodular goiter (after a syncopal episode in April of 2015, she was eval with carotid US, and there was incidentally noted to have nodules in the thyroid; she is euthyroid; bx was not done, due to advanced age and FHx of benign goiter; [place] in late 2015 was not significantly changed). She does not notice the nodules.  Pt returns for f/u of diabetes mellitus:  DM type: 2 Dx'ed: [id number] Complications: polyneuropathy and PAD.   Therapy: 2 oral meds DKA: never Severe hypoglycemia: never.   Pancreatitis: never.   Other: she took insulin for only a brief time after dx; she declines Tong[place]; pt says she wants brand-name starlix, as the tab resembles another med.   Interval history:  She stopped metformin, due to diarrhea.  pt states she feels well in general.  no cbg record, but states cbg's are in the mid-100's.  Past Medical History  Diagnosis Date  . Seasonal allergies   . HTN (hypertension)   . Osteoarthritis   . Palpitations   . Anemia   . History of colonic polyps   . GERD (gastroesophageal reflux disease)   . Hyperlipidemia   . Chronic facial pain   . Vitamin B12 deficiency   . Dyslipidemia   . Chronic foot pain   . Chronic cough   . Obesity   . Anxiety   . Complication of anesthesia     Sore jaw; could not chew or move mouth  . Diabetes mellitus     type 2 niddm x 20 years  . Metatarsal bone fracture 2014  . Hammer toe     bilateral  . Hx of radiation therapy 03/07/13- 03/29/13    right breast 4250 cGy 17 sessions  . Ejection fraction   . Skin cancer   . Melanoma ([place])   . Cancer of right breast ([place]) 12/26/12    right breast 12:00 o'clock, DCIS  . Convulsions/seizures ([place]) 10/16/2014    Past Surgical History  Procedure Laterality Date  . Colonoscopy    . Polypectomy      small adenomatous  . Abdominal hysterectomy    . Cholecystectomy    .  Hernia repair    . Knee arthroscopy Bilateral   . Ulnar tunnel release    . Eye surgery    . Cataract extraction w/ intraocular lens implant Right   . Bunionectomy    . Breast lumpectomy with needle localization Right 01/17/2013    Procedure: BREAST LUMPECTOMY WITH NEEDLE LOCALIZATION;  Surgeon: Edward Jolly, MD;  Location: [facility];  Service: General;  Laterality: Right;  . Breast biopsy Right 01/24/2013    Procedure: RE-EXCICION OF BREAST CANCER, ANTERIOR MARGINS;  Surgeon: Edward Jolly, MD;  Location: [facility];  Service: General;  Laterality: Right;  . Craniotomy Right 10/18/2013    Procedure: CRANIOTOMY TUMOR EXCISION;  Surgeon: Floyce Stakes, MD;  Location: [facility];  Service: Neurosurgery;  Laterality: Right;    Social History   Social History  . Marital Status: Widowed    Spouse Name: N/A  . Number of Children: 0  . Years of Education: college   Occupational History  . retired    Social History Main Topics  . Smoking status: Never Smoker   . Smokeless tobacco: Never Used  . Alcohol Use: No  . Drug Use: No  .  Sexual Activity: Yes     Comment: menarche age 45, hysterectomy age 53, HRT x 2-3 mos, G1- miscarriage   Other Topics Concern  . Not on file   Social History Narrative   Widowed, lives alone.  Ambulates independently.    Drinks decaf only   Patient is right handed.    Current Outpatient Prescriptions on File Prior to Visit  Medication Sig Dispense Refill  . acetaminophen (TYLENOL) 500 MG tablet Take 1,000 mg by mouth every 6 (six) hours as needed for pain.    Marland Kitchen ALPRAZolam (XANAX) 0.25 MG tablet Take 1 tablet (0.25 mg total) by mouth at bedtime. 30 tablet 1  . cholecalciferol (VITAMIN D) 1000 units tablet Take 1,000 Units by mouth daily.    . colestipol (COLESTID) 1 G tablet Take 1-2 g by mouth 3 (three) times daily. Take 2 tablets in the morning, 1 tablet at noon, and 2 tablets in the evening    . cyanocobalamin (,VITAMIN B-12,) 1000 MCG/ML  injection Inject 1 mL (1,000 mcg total) into the muscle every 21 ( twenty-one) days. 3 mL 3  . fexofenadine (ALLEGRA) 60 MG tablet Take 60 mg by mouth daily as needed for allergies.     . furosemide (LASIX) 20 MG tablet Take 1 tablet (20 mg total) by mouth daily. Do not take until I call you about labs. (Patient taking differently: Take 20 mg by mouth daily as needed for fluid or edema. Do not take until I call you about labs.) 30 tablet 0  . levothyroxine (SYNTHROID, LEVOTHROID) 50 MCG tablet     . lidocaine (LIDODERM) 5 % Place 1 patch onto the skin daily. Remove & Discard patch within 12 hours or as directed by MD (Patient taking differently: Place 1 patch onto the skin daily as needed (pain). Remove & Discard patch within 12 hours or as directed by MD) 90 patch 1  . lidocaine (LIDODERM) 5 % Place 1 patch onto the skin daily. Remove & Discard patch within 12 hours or as directed by MD 30 patch 0  . OVER THE COUNTER MEDICATION Take 1 capsule by mouth daily. Recall Max memory supplement    . phenytoin (DILANTIN) 100 MG ER capsule Take 3 capsules (300 mg total) by mouth at bedtime. (Patient taking differently: Take 200 mg by mouth at bedtime. ) 270 capsule 1  . phenytoin (DILANTIN) 50 MG tablet Chew 1 tablet (50 mg total) by mouth daily. (Patient taking differently: Chew 150 mg by mouth daily. ) 90 tablet 1  . Polyethyl Glycol-Propyl Glycol 0.4-0.3 % SOLN Place 2 drops into both eyes at bedtime.     . ranitidine (ZANTAC) 150 MG capsule Take 150 mg by mouth daily as needed for heartburn.     Marland Kitchen TARKA 2-180 MG per tablet TAKE 1 TABLET DAILY 90 tablet 1  . calcium carbonate (OS-CAL) 600 MG TABS Take 600 mg by mouth daily. Reported on 09/03/2015    . escitalopram (LEXAPRO) 5 MG tablet Take 1 tablet by mouth daily. Reported on 09/03/2015     No current facility-administered medications on file prior to visit.    Allergies  Allergen Reactions  . Bystolic [Nebivolol Hcl] Other (See Comments)    "extreme  weakness, heaviness in legs & arms, swelling in legs/arms/face, swollen abdomen, pain in bladder, feet pain, soreness in chest"  . Cholestyramine     "itching rash on stomach, bloated, nausea, vomiting, sleeplessness, extreme pain in arms"  . Hydrazine Yellow [Tartrazine] Other (See Comments)    "  does not reduce high blood pressure, pain in arm, high pressure, felt like I was on verge of heart attack, really weak"  . Morphine Other (See Comments)    Feels morbid, weak, still in pain  . Niacin Palpitations    Fast heart beat  . Niaspan [Niacin Er] Palpitations and Other (See Comments)    "fast heart beat, high blood pressure"  . Norvasc [Amlodipine Besylate] Other (See Comments)    "extreme fluid retention/pain)  . Optivar [Azelastine Hcl] Photosensitivity  . Sular [Nisoldipine Er] Other (See Comments)    "severe headaches, swelling eyes, hands, feet, shortness of breath, weak, flushed face, brain boiling, fluid retention, high blood sugar, nervous, heart fast beating"  . Tegretol [Carbamazepine] Other (See Comments)    Blood poisoning   . Telmisartan Other (See Comments)    "headache, difficulty urinating, high blood sugar, fluid retention"  . Cefdinir Swelling    Vaginal irritation, breathing,   . Clonidine Other (See Comments)    Dry mouth, fluid retention  . Clonidine Hydrochloride Other (See Comments)    Dry mouth, fluid retention  . Codeine Nausea And Vomiting  . Ezetimibe Other (See Comments)    Made weak  . Naproxen Other (See Comments)    Shrinks bladder  . Ziac [Bisoprolol-Hydrochlorothiazide] Other (See Comments)    "stopped urination"  . Elavil [Amitriptyline] Other (See Comments)    Gave Pt nightmares  . Keppra [Levetiracetam] Other (See Comments)    Shaking  . Lamictal [Lamotrigine]     itching  . Lyrica [Pregabalin] Swelling  . Topamax [Topiramate]     Dry eyes  . Ace Inhibitors Other (See Comments)    unknown  . Actonel [Risedronate Sodium] Other (See  Comments)    unknown  . Amlodipine Besylate Other (See Comments)    unknown  . Aspirin Other (See Comments)    unknown  . Atacand [Candesartan] Other (See Comments)    unknown  . Bextra [Valdecoxib] Other (See Comments)    unknown  . Bisoprolol-Hydrochlorothiazide Other (See Comments)    unknown  . Candesartan Cilexetil Other (See Comments)    unknown  . Cefadroxil Other (See Comments)    unknown  . Celecoxib Rash  . Hydrocodone Other (See Comments)    unknown  . Hydrocodone-Acetaminophen Other (See Comments)    unknown  . Iodinated Diagnostic Agents Rash    "All over"  . Meloxicam Other (See Comments)    unknown  . Methylprednisolone Sodium Succinate Other (See Comments)    unknown  . Nabumetone Other (See Comments)    unknown  . Penicillins Other (See Comments)    unknown  . Pseudoephedrine-Guaifenesin Other (See Comments)    unknown  . Risedronate Sodium Other (See Comments)    unknown  . Rofecoxib Other (See Comments)    unknown  . Ru-Tuss [Chlorphen-Pse-Atrop-Hyos-Scop] Other (See Comments)    unknown  . Sulfonamide Derivatives Other (See Comments)    unknown  . Sulphur [Sulfur] Other (See Comments)    unknown  . Telithromycin Other (See Comments)    unknown  . Terfenadine Other (See Comments)    unknown  . Trandolapril-Verapamil Hcl Er Other (See Comments)    Headache, difficulty urinating, high blood sugar, fluid retention  Pt is taking Tarka (trandolapril-verapamil) currently, but requests the medication stay in her allergy list  . Valium [Diazepam] Other (See Comments)    Makes her mean and hyper    Family History  Problem Relation Age of Onset  . Colon cancer    .  Heart disease Mother   . Diabetes Father   . Pancreatic cancer Father   . Bone cancer Sister   . Prostate cancer Brother   . Colon cancer Brother   . Rectal cancer Sister   . Thyroid disease Sister     benign goiter resected    BP 146/94 mmHg  Pulse 116  Temp(Src) 97.6 F  (36.4 C) (Oral)  Ht 5' 7.5" (1.715 m)  Wt 132 lb (59.875 kg)  BMI 20.36 kg/m2  SpO2 98%  Review of Systems She denies hypoglycemia.    Objective:   Physical Exam VITAL SIGNS:  See vs page GENERAL: no distress Pulses: dorsalis pedis intact bilat.   MSK: no deformity of the feet, except for bunions.   CV: no leg edema Skin:  no ulcer on the feet, but there are several calluses.  normal color and temp on the feet. Neuro: sensation is intact to touch on the feet, but decreased from normal.   A1c=8.2%  outside test results are reviewed: TSH=2.9     Assessment & Plan:  DM: worse. Goiter: euthyroid on rx.  same medication.  Patient is advised the following: Patient Instructions  i have sent a prescription to your pharmacy,  For an additional diabetes medication.    Please come back for a follow-up appointment in 3 months.   check your blood sugar once a day.  vary the time of day when you check, between before the 3 meals, and at bedtime.  also check if you have symptoms of your blood sugar being too high or too low.  please keep a record of the readings and bring it to your next appointment here (or you can bring the meter itself).  You can write it on any piece of paper.  please call us sooner if your blood sugar goes below 70, or if you have a lot of readings over 200.

## 2015-09-10 ENCOUNTER — Ambulatory Visit: Payer: Medicare Other | Admitting: Podiatry

## 2015-09-11 ENCOUNTER — Telehealth: Payer: Self-pay | Admitting: Endocrinology

## 2015-09-11 NOTE — Telephone Encounter (Signed)
See note below and please advise, Thanks! 

## 2015-09-11 NOTE — Telephone Encounter (Signed)
Patient takes Tonga and stated that her b/s has been increasing, and in the side effects cancer is one of them, she had cancer before, and want Dr Loanne Drilling to put her on another medication. Please advise

## 2015-09-11 NOTE — Telephone Encounter (Signed)
Last week, it was changed to "tradjenta." Do you need [place] to resend rx?

## 2015-09-11 NOTE — Telephone Encounter (Signed)
Attempted to reach the pt. Pt was unavailable. Will try again at a later time.  

## 2015-09-12 ENCOUNTER — Other Ambulatory Visit: Payer: Self-pay | Admitting: Endocrinology

## 2015-09-12 MED ORDER — GLYBURIDE 1.25 MG PO TABS: 0.6250 mg | ORAL_TABLET | Freq: Every day | ORAL | Status: DC

## 2015-09-12 NOTE — Telephone Encounter (Signed)
See note below and please advise, Thanks! 

## 2015-09-12 NOTE — Telephone Encounter (Signed)
Patient stated that the Tradjenta is in the same family as Januvia, it causes cancer as well and she will not take it, she stated the pharmacy have been trying to call but couldn't get through, Glipizide and glyburide she will try, [facility]

## 2015-09-12 NOTE — Telephone Encounter (Signed)
Ok, i have sent a prescription to your pharmacy, for the glyburide This medication has a tendency to push the blood sugar too low, so you need to take a tiny amount Please call if cbg goes below 70. i'll see you next time.

## 2015-09-12 NOTE — Telephone Encounter (Signed)
Attempted to reach the pt. Pt was unavailable. Will try again at a later time.  

## 2015-09-13 ENCOUNTER — Other Ambulatory Visit: Payer: Self-pay | Admitting: Neurology

## 2015-09-13 NOTE — Telephone Encounter (Signed)
I contacted the pt and advised of note below and she voiced understanding.  

## 2015-09-17 DIAGNOSIS — I10 Essential (primary) hypertension: Secondary | ICD-10-CM | POA: Diagnosis not present

## 2015-09-17 DIAGNOSIS — E119 Type 2 diabetes mellitus without complications: Secondary | ICD-10-CM | POA: Diagnosis not present

## 2015-09-17 DIAGNOSIS — E782 Mixed hyperlipidemia: Secondary | ICD-10-CM | POA: Diagnosis not present

## 2015-09-17 DIAGNOSIS — F419 Anxiety disorder, unspecified: Secondary | ICD-10-CM | POA: Diagnosis not present

## 2015-09-18 ENCOUNTER — Other Ambulatory Visit: Payer: Self-pay

## 2015-09-18 ENCOUNTER — Telehealth: Payer: Self-pay | Admitting: Endocrinology

## 2015-09-18 ENCOUNTER — Other Ambulatory Visit: Payer: Self-pay | Admitting: Neurology

## 2015-09-18 MED ORDER — NATEGLINIDE 120 MG PO TABS: 120.0000 mg | ORAL_TABLET | Freq: Three times a day (TID) | ORAL | Status: DC

## 2015-09-18 NOTE — Telephone Encounter (Signed)
Attempted to reach the pt. Pt was unavailable, will try again at a later time.

## 2015-09-18 NOTE — Telephone Encounter (Signed)
Pt just received the Diabetes med today   Pt needs starlix and keeps receiving the generic

## 2015-09-19 ENCOUNTER — Other Ambulatory Visit: Payer: Self-pay | Admitting: Adult Health

## 2015-09-19 MED ORDER — PHENYTOIN 50 MG PO CHEW: 50.0000 mg | CHEWABLE_TABLET | Freq: Every day | ORAL | Status: DC

## 2015-09-19 NOTE — Telephone Encounter (Signed)
Spoke to caregiver.   [facility] [address] is where to send 15 day supply as she has one pill left.

## 2015-09-19 NOTE — Telephone Encounter (Signed)
Pts caregiver called asking for a 15 day supply of PHENYTOIN INFATABS 50 MG tablet be sent to local pharmacy. Pt is out of medication. [place] [facility] [phone] [facility]: [phone] , confirm receipt of rx.

## 2015-09-19 NOTE — Telephone Encounter (Signed)
Attempted to reach the pt. Pt was unavailable. Will try again at a later time.  

## 2015-09-19 NOTE — Telephone Encounter (Signed)
I spoke to [facility].  Should receive Phenytoin (dilantin) 100mg  ER tabs on 09-21-15.  The Phenytoin infatabs 50mg  should be one week.  Can expedite next day $21 fee or 2 day $15 fee.  Can get emergency overide 10-15 days supply from local pharm.  Local pharmacy to call [phone].  Pt out of the infatabs.  I LMVM for pt to return call.

## 2015-09-19 NOTE — Telephone Encounter (Signed)
A prescription for the Dilantin was sent in.

## 2015-09-23 ENCOUNTER — Telehealth: Payer: Self-pay | Admitting: Endocrinology

## 2015-09-23 DIAGNOSIS — S42294D Other nondisplaced fracture of upper end of right humerus, subsequent encounter for fracture with routine healing: Secondary | ICD-10-CM | POA: Diagnosis not present

## 2015-09-23 NOTE — Telephone Encounter (Signed)
See note below and please advise, Thanks! 

## 2015-09-23 NOTE — Telephone Encounter (Signed)
It is not safe to increase this medication Please reconsider the medication i recommended for you.

## 2015-09-23 NOTE — Telephone Encounter (Signed)
Glyburide 1.25 mg pill is not helping her at all she cannot get her BS under 250

## 2015-09-24 ENCOUNTER — Telehealth: Payer: Self-pay | Admitting: Endocrinology

## 2015-09-24 MED ORDER — REPAGLINIDE 2 MG PO TABS: 2.0000 mg | ORAL_TABLET | Freq: Three times a day (TID) | ORAL | Status: DC

## 2015-09-24 NOTE — Telephone Encounter (Signed)
I contacted the pt and advised of note below. Pt stated she is ok with changing the starlix and glyburide to repaglinide.

## 2015-09-24 NOTE — Telephone Encounter (Signed)
I contacted the pt. Message fwd to MD in separate telephone note.

## 2015-09-24 NOTE — Telephone Encounter (Signed)
Pt advised of med being sent.

## 2015-09-24 NOTE — Telephone Encounter (Signed)
I contacted the pt. Pt stated she did not want to try the Tradjent because her pharmacy told her it was in the same family has Januvia. Pt also stated the pharmacy advised her [place] can cause cancer and she has had breast cancer in the bast. Pt wanted to know what else she could try. She stated her fast blood sugar was 221 this morning.  Please advise, Thanks!

## 2015-09-24 NOTE — Addendum Note (Signed)
Addended by: Verlin Grills T on: 09/24/2015 04:53 PM   Modules accepted: Orders

## 2015-09-24 NOTE — Addendum Note (Signed)
Addended by: Renato Shin on: 09/24/2015 04:42 PM   Modules accepted: Orders, Medications

## 2015-09-24 NOTE — Telephone Encounter (Signed)
PT calling again about her sugars being very high and is upset that she did not get a call back yesterday.

## 2015-09-24 NOTE — Telephone Encounter (Signed)
Another option is to change starlix and glyburide to repaglinide.  Ok?

## 2015-09-24 NOTE — Telephone Encounter (Signed)
Ok, i have sent a prescription to your pharmacy  

## 2015-10-01 ENCOUNTER — Encounter: Payer: Self-pay | Admitting: Podiatry

## 2015-10-01 ENCOUNTER — Ambulatory Visit (INDEPENDENT_AMBULATORY_CARE_PROVIDER_SITE_OTHER): Payer: Medicare Other | Admitting: Podiatry

## 2015-10-01 DIAGNOSIS — L84 Corns and callosities: Secondary | ICD-10-CM

## 2015-10-01 DIAGNOSIS — E1142 Type 2 diabetes mellitus with diabetic polyneuropathy: Secondary | ICD-10-CM | POA: Diagnosis not present

## 2015-10-01 NOTE — Patient Instructions (Signed)
Diabetes and Foot Care Diabetes Bonilla cause you to have problems because of poor blood supply (circulation) to your feet and legs. This Slay cause the skin on your feet to become thinner, break easier, and heal more slowly. Your skin Zawistowski become dry, and the skin Galas peel and crack. You Michael also have nerve damage in your legs and feet causing decreased feeling in them. You Scholle not notice minor injuries to your feet that could lead to infections or more serious problems. Taking care of your feet is one of the most important things you can do for yourself.  HOME CARE INSTRUCTIONS  Wear shoes at all times, even in the house. Do not go barefoot. Bare feet are easily injured.  Check your feet daily for blisters, cuts, and redness. If you cannot see the bottom of your feet, use a mirror or ask someone for help.  Wash your feet with warm water (do not use hot water) and mild soap. Then pat your feet and the areas between your toes until they are completely dry. Do not soak your feet as this can dry your skin.  Apply a moisturizing lotion or petroleum jelly (that does not contain alcohol and is unscented) to the skin on your feet and to dry, brittle toenails. Do not apply lotion between your toes.  Trim your toenails straight across. Do not dig under them or around the cuticle. File the edges of your nails with an emery board or nail file.  Do not cut corns or calluses or try to remove them with medicine.  Wear clean socks or stockings every day. Make sure they are not too tight. Do not wear knee-high stockings since they Welge decrease blood flow to your legs.  Wear shoes that fit properly and have enough cushioning. To break in new shoes, wear them for just a few hours a day. This prevents you from injuring your feet. Always look in your shoes before you put them on to be sure there are no objects inside.  Do not cross your legs. This Mackert decrease the blood flow to your feet.  If you find a minor scrape,  cut, or break in the skin on your feet, keep it and the skin around it clean and dry. These areas Quach be cleansed with mild soap and water. Do not cleanse the area with peroxide, alcohol, or iodine.  When you remove an adhesive bandage, be sure not to damage the skin around it.  If you have a wound, look at it several times a day to make sure it is healing.  Do not use heating pads or hot water bottles. They Mazzola burn your skin. If you have lost feeling in your feet or legs, you Gatchel not know it is happening until it is too late.  Make sure your health care provider performs a complete foot exam at least annually or more often if you have foot problems. Report any cuts, sores, or bruises to your health care provider immediately. SEEK MEDICAL CARE IF:   You have an injury that is not healing.  You have cuts or breaks in the skin.  You have an ingrown nail.  You notice redness on your legs or feet.  You feel burning or tingling in your legs or feet.  You have pain or cramps in your legs and feet.  Your legs or feet are numb.  Your feet always feel cold. SEEK IMMEDIATE MEDICAL CARE IF:   There is increasing redness,   swelling, or pain in or around a wound.  There is a red line that goes up your leg.  Pus is coming from a wound.  You develop a fever or as directed by your health care provider.  You notice a bad smell coming from an ulcer or wound.   This information is not intended to replace advice given to you by your health care provider. Make sure you discuss any questions you have with your health care provider.   Document Released: 04/17/2000 Document Revised: 12/21/2012 Document Reviewed: 09/27/2012 Elsevier Interactive Patient Education 2016 [employer] Inc[employer]  

## 2015-10-01 NOTE — Progress Notes (Signed)
Patient ID: [name], female   DOB: 1930/03/02, 82 y.o.   MRN: [id number]  Subjective: This patient presents again at six-week intervals for debridement of pre-ulcerative plantar callus first MPJ bilaterally. She has been instructed to wear diabetic shoes, however, she admits she only wears them occasionally or in the house. Also in the last several weeks patient is noticed a corn on the second left toe which is applied Polysporin to the area. The patient extended visit from previous visit of 07/16/2015 because of arm fracture which reduces her mobility  Objective: Orientated 3 Patient wearing diabetic shoes today Hammertoe 2-5 bilaterally Bleeding callus plantar first MPJ bilaterally DP and PT pulses 2/4 bilaterally Decreased sensation to Semmes monofilament wire  Assessment: Diabetic peripheral neuropathy Pre-ulcerative noninfected eschar dorsal second left MPJ Pre-ulcerative plantar callus first MPJ bilaterally  Plan: Today I advised the patient strongly to wear diabetic shoes at all times.  She was advised she develops any sudden redness, swelling, pain present to ED our office  The pre-ulcerative plantar callus first MPJ after and the plantar callus right were debrided, bilaterally without any bleeding  Reappoint 6 weeks

## 2015-10-02 ENCOUNTER — Other Ambulatory Visit: Payer: Self-pay

## 2015-10-02 ENCOUNTER — Telehealth: Payer: Self-pay | Admitting: Endocrinology

## 2015-10-02 MED ORDER — REPAGLINIDE 2 MG PO TABS: 2.0000 mg | ORAL_TABLET | Freq: Three times a day (TID) | ORAL | Status: DC

## 2015-10-02 NOTE — Telephone Encounter (Signed)
I contacted the pt and advised I had contacted [facility] and they stated the Prandin had been shipped on 09/29/2015. Pt voiced understanding and requested a 30 day supply to be sent to her local pharmacy to hold her over until her medication is delivered. Rx submitted. Pt voiced understanding.

## 2015-10-02 NOTE — Telephone Encounter (Signed)
PT said that [facility] has not refilled whatever medication that was supposed to replace her Metformin yet and she has not had any type of medication and she needs to know what is going on because her sugar is very high and is making her sick.

## 2015-10-02 NOTE — Telephone Encounter (Signed)
Prandin sent to local pharmacy.

## 2015-10-07 ENCOUNTER — Other Ambulatory Visit: Payer: Self-pay | Admitting: Internal Medicine

## 2015-10-08 ENCOUNTER — Telehealth: Payer: Self-pay | Admitting: Endocrinology

## 2015-10-08 NOTE — Telephone Encounter (Signed)
See note below and please advise, Thanks! 

## 2015-10-08 NOTE — Telephone Encounter (Signed)
I contacted the pt and advised of note below. Pt stated when she was on insulin previously and she stated it made her feet cold. Pt stated she would not like to go back on the insulin at this time.

## 2015-10-08 NOTE — Telephone Encounter (Signed)
Patient stated that medication  repaglinide (PRANDIN) 2 MG tablet is not working. B/S has been running high. Breakfast 227 evening 200 before bed 179 patient is feeling bad, please advise

## 2015-10-08 NOTE — Telephone Encounter (Signed)
please call patient: As you have had trouble with multiple DM meds, you should consider going back to the insulin. Do you know how to inject, or due you need to see [name]?

## 2015-10-11 MED ORDER — METFORMIN HCL ER 500 MG PO TB24: 500.0000 mg | ORAL_TABLET | Freq: Every day | ORAL | Status: DC

## 2015-10-11 NOTE — Telephone Encounter (Signed)
Ok, but first, for safety, i need you to please list off the DM meds you are taking

## 2015-10-11 NOTE — Telephone Encounter (Signed)
I contacted the pt and advised of note below and voiced understanding.  

## 2015-10-11 NOTE — Telephone Encounter (Signed)
invokana needs new [facility]-she has gone back on metformin again yesterday because her sugar was so high

## 2015-10-11 NOTE — Telephone Encounter (Signed)
Ok, i have [facility] a [facility]. For your safety, let's start with 1 pill per day

## 2015-10-11 NOTE — Telephone Encounter (Signed)
I contacted the pt. Pt stated she is taking 500 mg of metformin tid and starlix 120 mg tid.

## 2015-10-11 NOTE — Telephone Encounter (Signed)
See notes below and please advise, thanks!

## 2015-10-14 ENCOUNTER — Telehealth: Payer: Self-pay | Admitting: Endocrinology

## 2015-10-14 NOTE — Telephone Encounter (Signed)
Patient stated that the medication metFORMIN (GLUCOPHAGE-XR) 500 MG 24 hr tablet is not working, it is not making her b/s go down, she is having diarrhea, and acid reflux, please advise

## 2015-10-14 NOTE — Telephone Encounter (Signed)
See note below and please advise, Thanks! 

## 2015-10-14 NOTE — Telephone Encounter (Signed)
Ov next available 

## 2015-10-15 NOTE — Telephone Encounter (Signed)
Pt added to the scheduled for 11:15 am on Friday.

## 2015-10-15 NOTE — Telephone Encounter (Signed)
I contacted the pt and advised of note below. Right now we have an appointment on 10/18/2015 at 1 pm. Pt stated this appointment time would not work. Pt wanted know if she could be worked in around 11 on Friday because she would like to see you before you go on vacation.  Please advise, Thanks!

## 2015-10-15 NOTE — Telephone Encounter (Signed)
ok 

## 2015-10-17 ENCOUNTER — Ambulatory Visit: Payer: Medicare Other | Admitting: Endocrinology

## 2015-10-18 ENCOUNTER — Ambulatory Visit (INDEPENDENT_AMBULATORY_CARE_PROVIDER_SITE_OTHER): Payer: Medicare Other | Admitting: Endocrinology

## 2015-10-18 ENCOUNTER — Ambulatory Visit: Payer: Medicare Other | Admitting: Endocrinology

## 2015-10-18 ENCOUNTER — Telehealth: Payer: Self-pay | Admitting: Endocrinology

## 2015-10-18 ENCOUNTER — Encounter: Payer: Self-pay | Admitting: Endocrinology

## 2015-10-18 VITALS — BP 140/72 | HR 82 | Temp 98.6°F | Ht 67.5 in | Wt 134.0 lb

## 2015-10-18 DIAGNOSIS — E1151 Type 2 diabetes mellitus with diabetic peripheral angiopathy without gangrene: Secondary | ICD-10-CM | POA: Diagnosis not present

## 2015-10-18 MED ORDER — PIOGLITAZONE HCL 45 MG PO TABS: 45.0000 mg | ORAL_TABLET | Freq: Every day | ORAL | Status: DC

## 2015-10-18 MED ORDER — PRODIGY VOICE BLOOD GLUCOSE W/DEVICE KIT: PACK | Status: AC

## 2015-10-18 MED ORDER — STARLIX 120 MG PO TABS: 120.0000 mg | ORAL_TABLET | Freq: Three times a day (TID) | ORAL | Status: DC

## 2015-10-18 MED ORDER — METFORMIN HCL ER 500 MG PO TB24: 1000.0000 mg | ORAL_TABLET | Freq: Every day | ORAL | Status: DC

## 2015-10-18 NOTE — Progress Notes (Signed)
Pre visit review using our clinic review tool, if applicable. No additional management support is needed unless otherwise documented below in the visit note. 

## 2015-10-18 NOTE — Progress Notes (Signed)
Subjective:    Patient ID: [name], female    DOB: [birth year], 82 y.o.   MRN: [id number]  HPI Pt returns for f/u of diabetes mellitus:  DM type: 2 Dx'ed: [id number] Complications: polyneuropathy and PAD.   Therapy: 2 oral meds DKA: never Severe hypoglycemia: never.   Pancreatitis: never.   Other: she took insulin for only a brief time after dx; she declines [place]; pt says she wants brand-name starlix, as the tab resembles another med; she declines prandin; she can tolerate metformin-XR only 1000/d--she gets diarrhea if she takes any more.   Interval history:  she brings a record of her cbg's which i have reviewed today.  It varies from 138-400.  There is no trend throughout the day. Pt has multinodular goiter (after a syncopal episode in April of 2015, she was eval with carotid US, and there was incidentally noted to have nodules in the thyroid; she is euthyroid; bx was not done, due to advanced age and FHx of benign goiter; [place] in late 2015 was not significantly changed).  Past Medical History  Diagnosis Date  . Seasonal allergies   . HTN (hypertension)   . Osteoarthritis   . Palpitations   . Anemia   . History of colonic polyps   . GERD (gastroesophageal reflux disease)   . Hyperlipidemia   . Chronic facial pain   . Vitamin B12 deficiency   . Dyslipidemia   . Chronic foot pain   . Chronic cough   . Obesity   . Anxiety   . Complication of anesthesia     Sore jaw; could not chew or move mouth  . Diabetes mellitus     type 2 niddm x 20 years  . Metatarsal bone fracture 2014  . Hammer toe     bilateral  . Hx of radiation therapy 03/07/13- 03/29/13    right breast 4250 cGy 17 sessions  . Ejection fraction   . Skin cancer   . Melanoma ([place])   . Cancer of right breast ([place]) 12/26/12    right breast 12:00 o'clock, DCIS  . Convulsions/seizures ([place]) 10/16/2014    Past Surgical History  Procedure Laterality Date  . Colonoscopy    . Polypectomy      small adenomatous  .  Abdominal hysterectomy    . Cholecystectomy    . Hernia repair    . Knee arthroscopy Bilateral   . Ulnar tunnel release    . Eye surgery    . Cataract extraction w/ intraocular lens implant Right   . Bunionectomy    . Breast lumpectomy with needle localization Right 01/17/2013    Procedure: BREAST LUMPECTOMY WITH NEEDLE LOCALIZATION;  Surgeon: Edward Jolly, MD;  Location: [facility];  Service: General;  Laterality: Right;  . Breast biopsy Right 01/24/2013    Procedure: RE-EXCICION OF BREAST CANCER, ANTERIOR MARGINS;  Surgeon: Edward Jolly, MD;  Location: [facility];  Service: General;  Laterality: Right;  . Craniotomy Right 10/18/2013    Procedure: CRANIOTOMY TUMOR EXCISION;  Surgeon: Floyce Stakes, MD;  Location: [facility];  Service: Neurosurgery;  Laterality: Right;    Social History   Social History  . Marital Status: Widowed    Spouse Name: N/A  . Number of Children: 0  . Years of Education: college   Occupational History  . retired    Social History Main Topics  . Smoking status: Never Smoker   . Smokeless tobacco: Never Used  . Alcohol Use: No  .  Drug Use: No  . Sexual Activity: Yes     Comment: menarche age 71, hysterectomy age 32, HRT x 2-3 mos, G1- miscarriage   Other Topics Concern  . Not on file   Social History Narrative   Widowed, lives alone.  Ambulates independently.    Drinks decaf only   Patient is right handed.    Current Outpatient Prescriptions on File Prior to Visit  Medication Sig Dispense Refill  . acetaminophen (TYLENOL) 500 MG tablet Take 1,000 mg by mouth every 6 (six) hours as needed for pain.    Marland Kitchen ALPRAZolam (XANAX) 0.25 MG tablet Take 1 tablet (0.25 mg total) by mouth at bedtime. 30 tablet 1  . calcium carbonate (OS-CAL) 600 MG TABS Take 600 mg by mouth daily. Reported on 09/03/2015    . cholecalciferol (VITAMIN D) 1000 units tablet Take 1,000 Units by mouth daily.    . colestipol (COLESTID) 1 G tablet Take 1-2 g by mouth 3 (three)  times daily. Take 2 tablets in the morning, 1 tablet at noon, and 2 tablets in the evening    . cyanocobalamin (,VITAMIN B-12,) 1000 MCG/ML injection INJECT 1 ML INTRAMUSCULARLY EVERY 21 DAYS 3 mL 2  . escitalopram (LEXAPRO) 5 MG tablet Take 1 tablet by mouth daily. Reported on 09/03/2015    . fexofenadine (ALLEGRA) 60 MG tablet Take 60 mg by mouth daily as needed for allergies.     . furosemide (LASIX) 20 MG tablet Take 1 tablet (20 mg total) by mouth daily. Do not take until I call you about labs. (Patient taking differently: Take 20 mg by mouth daily as needed for fluid or edema. Do not take until I call you about labs.) 30 tablet 0  . levothyroxine (SYNTHROID, LEVOTHROID) 50 MCG tablet     . lidocaine (LIDODERM) 5 % Place 1 patch onto the skin daily. Remove & Discard patch within 12 hours or as directed by MD (Patient taking differently: Place 1 patch onto the skin daily as needed (pain). Remove & Discard patch within 12 hours or as directed by MD) 90 patch 1  . lidocaine (LIDODERM) 5 % Place 1 patch onto the skin daily. Remove & Discard patch within 12 hours or as directed by MD 30 patch 0  . OVER THE COUNTER MEDICATION Take 1 capsule by mouth daily. Recall Max memory supplement    . phenytoin (DILANTIN) 100 MG ER capsule TAKE 3 CAPSULES AT BEDTIME 270 capsule 3  . phenytoin (DILANTIN) 50 MG tablet Chew 1 tablet (50 mg total) by mouth daily. 15 tablet 1  . PHENYTOIN INFATABS 50 MG tablet CHEW 1 TABLET DAILY 90 tablet 0  . Polyethyl Glycol-Propyl Glycol 0.4-0.3 % SOLN Place 2 drops into both eyes at bedtime.     . ranitidine (ZANTAC) 150 MG capsule Take 150 mg by mouth daily as needed for heartburn.     Marland Kitchen TARKA 2-180 MG per tablet TAKE 1 TABLET DAILY 90 tablet 1  . traMADol (ULTRAM) 50 MG tablet Take by mouth every 6 (six) hours as needed.     No current facility-administered medications on file prior to visit.    Allergies  Allergen Reactions  . Bystolic [Nebivolol Hcl] Other (See Comments)      "extreme weakness, heaviness in legs & arms, swelling in legs/arms/face, swollen abdomen, pain in bladder, feet pain, soreness in chest"  . Cholestyramine     "itching rash on stomach, bloated, nausea, vomiting, sleeplessness, extreme pain in arms"  . Hydrazine Yellow [  Tartrazine] Other (See Comments)    "does not reduce high blood pressure, pain in arm, high pressure, felt like I was on verge of heart attack, really weak"  . Morphine Other (See Comments)    Feels morbid, weak, still in pain  . Niacin Palpitations    Fast heart beat  . Niaspan [Niacin Er] Palpitations and Other (See Comments)    "fast heart beat, high blood pressure"  . Norvasc [Amlodipine Besylate] Other (See Comments)    "extreme fluid retention/pain)  . Optivar [Azelastine Hcl] Photosensitivity  . Sular [Nisoldipine Er] Other (See Comments)    "severe headaches, swelling eyes, hands, feet, shortness of breath, weak, flushed face, brain boiling, fluid retention, high blood sugar, nervous, heart fast beating"  . Tegretol [Carbamazepine] Other (See Comments)    Blood poisoning   . Telmisartan Other (See Comments)    "headache, difficulty urinating, high blood sugar, fluid retention"  . Cefdinir Swelling    Vaginal irritation, breathing,   . Clonidine Other (See Comments)    Dry mouth, fluid retention  . Clonidine Hydrochloride Other (See Comments)    Dry mouth, fluid retention  . Codeine Nausea And Vomiting  . Ezetimibe Other (See Comments)    Made weak  . Naproxen Other (See Comments)    Shrinks bladder  . Ziac [Bisoprolol-Hydrochlorothiazide] Other (See Comments)    "stopped urination"  . Elavil [Amitriptyline] Other (See Comments)    Gave Pt nightmares  . Keppra [Levetiracetam] Other (See Comments)    Shaking  . Lamictal [Lamotrigine]     itching  . Lyrica [Pregabalin] Swelling  . Topamax [Topiramate]     Dry eyes  . Ace Inhibitors Other (See Comments)    unknown  . Actonel [Risedronate Sodium]  Other (See Comments)    unknown  . Amlodipine Besylate Other (See Comments)    unknown  . Aspirin Other (See Comments)    unknown  . Atacand [Candesartan] Other (See Comments)    unknown  . Bextra [Valdecoxib] Other (See Comments)    unknown  . Bisoprolol-Hydrochlorothiazide Other (See Comments)    unknown  . Candesartan Cilexetil Other (See Comments)    unknown  . Cefadroxil Other (See Comments)    unknown  . Celecoxib Rash  . Hydrocodone Other (See Comments)    unknown  . Hydrocodone-Acetaminophen Other (See Comments)    unknown  . Iodinated Diagnostic Agents Rash    "All over"  . Meloxicam Other (See Comments)    unknown  . Methylprednisolone Sodium Succinate Other (See Comments)    unknown  . Nabumetone Other (See Comments)    unknown  . Penicillins Other (See Comments)    unknown  . Pseudoephedrine-Guaifenesin Other (See Comments)    unknown  . Risedronate Sodium Other (See Comments)    unknown  . Rofecoxib Other (See Comments)    unknown  . Ru-Tuss [Chlorphen-Pse-Atrop-Hyos-Scop] Other (See Comments)    unknown  . Sulfonamide Derivatives Other (See Comments)    unknown  . Sulphur [Sulfur] Other (See Comments)    unknown  . Telithromycin Other (See Comments)    unknown  . Terfenadine Other (See Comments)    unknown  . Trandolapril-Verapamil Hcl Er Other (See Comments)    Headache, difficulty urinating, high blood sugar, fluid retention  Pt is taking Tarka (trandolapril-verapamil) currently, but requests the medication stay in her allergy list  . Valium [Diazepam] Other (See Comments)    Makes her mean and hyper    Family History  Problem Relation  Age of Onset  . Colon cancer    . Heart disease Mother   . Diabetes Father   . Pancreatic cancer Father   . Bone cancer Sister   . Prostate cancer Brother   . Colon cancer Brother   . Rectal cancer Sister   . Thyroid disease Sister     benign goiter resected    BP 140/72 mmHg  Pulse 82  Temp(Src)  98.6 F (37 C) (Oral)  Ht 5' 7.5" (1.715 m)  Wt 134 lb (60.782 kg)  BMI 20.67 kg/m2  SpO2 98%  Review of Systems She denies hypoglycemia    Objective:   Physical Exam VITAL SIGNS:  See vs page GENERAL: no distress Pulses: dorsalis pedis intact bilat.   MSK: no deformity of the feet, except for bunions and hammer toes.   CV: no leg edema Skin:  no ulcer on the feet, but there are several calluses.  normal color and temp on the feet. Neuro: sensation is intact to touch on the feet, but decreased from normal.   Lab Results  Component Value Date   HGBA1C 8.2 09/03/2015   Lab Results  Component Value Date   CREATININE 0.85 02/04/2015   BUN 13 02/04/2015   NA 132* 02/04/2015   K 4.0 02/04/2015   CL 100* 02/04/2015   CO2 23 02/04/2015      Assessment & Plan:  Type 2 DM: she needs increased rx.  She Zelaya be evolving type 1 multiple drug intolerances: these limit rx options.  Patient is advised the following: Patient Instructions  check your blood sugar once a day.  vary the time of day when you check, between before the 3 meals, and at bedtime.  also check if you have symptoms of your blood sugar being too high or too low.  please keep a record of the readings and bring it to your next appointment here (or you can bring the meter itself).  You can write it on any piece of paper.  please call us sooner if your blood sugar goes below 70, or if you have a lot of readings over 200. For now, please take the metformin 2/day, and:  Starlix, 3 times a day (just before each meal), and:  Add "pioglitizone."  i have sent a prescription to your pharmacy.   Please note that this is a slow-acting medication.   It is important to carefully watch the blood sugar, as you Hamil need to resume the insulin.   Please come back for a follow-up appointment in 1 month.  Renato Shin, MD

## 2015-10-18 NOTE — Patient Instructions (Addendum)
check your blood sugar once a day.  vary the time of day when you check, between before the 3 meals, and at bedtime.  also check if you have symptoms of your blood sugar being too high or too low.  please keep a record of the readings and bring it to your next appointment here (or you can bring the meter itself).  You can write it on any piece of paper.  please call us sooner if your blood sugar goes below 70, or if you have a lot of readings over 200. For now, please take the metformin 2/day, and:  Starlix, 3 times a day (just before each meal), and:  Add "pioglitizone."  i have sent a prescription to your pharmacy.   Please note that this is a slow-acting medication.   It is important to carefully watch the blood sugar, as you [name] need to resume the insulin.   Please come back for a follow-up appointment in 1 month.

## 2015-10-18 NOTE — Telephone Encounter (Signed)
Patient ned a prescription for her prodigy meter send  [facility] [address] [place], [place] [address] [phone] (Phone) [phone] (Fax)

## 2015-10-18 NOTE — Telephone Encounter (Signed)
Rx submitted per pt's request.  

## 2015-10-21 DIAGNOSIS — S42294D Other nondisplaced fracture of upper end of right humerus, subsequent encounter for fracture with routine healing: Secondary | ICD-10-CM | POA: Diagnosis not present

## 2015-10-22 ENCOUNTER — Telehealth: Payer: Self-pay | Admitting: Endocrinology

## 2015-10-22 MED ORDER — GLUCOSE BLOOD VI STRP: ORAL_STRIP | Status: DC

## 2015-10-22 NOTE — Telephone Encounter (Signed)
Rx printed and faxed to [facility].

## 2015-10-22 NOTE — Telephone Encounter (Signed)
PT called and said that she did not need the actual meter called in, she needs the test strips for the prodigy meter called in.  She wants them sent into [facility] # [phone]

## 2015-10-28 DIAGNOSIS — S42201D Unspecified fracture of upper end of right humerus, subsequent encounter for fracture with routine healing: Secondary | ICD-10-CM | POA: Diagnosis not present

## 2015-10-29 DIAGNOSIS — F411 Generalized anxiety disorder: Secondary | ICD-10-CM | POA: Diagnosis not present

## 2015-10-29 DIAGNOSIS — M542 Cervicalgia: Secondary | ICD-10-CM | POA: Diagnosis not present

## 2015-10-29 DIAGNOSIS — M9901 Segmental and somatic dysfunction of cervical region: Secondary | ICD-10-CM | POA: Diagnosis not present

## 2015-10-29 DIAGNOSIS — M546 Pain in thoracic spine: Secondary | ICD-10-CM | POA: Diagnosis not present

## 2015-10-29 DIAGNOSIS — M9902 Segmental and somatic dysfunction of thoracic region: Secondary | ICD-10-CM | POA: Diagnosis not present

## 2015-10-29 DIAGNOSIS — E782 Mixed hyperlipidemia: Secondary | ICD-10-CM | POA: Diagnosis not present

## 2015-10-29 DIAGNOSIS — I10 Essential (primary) hypertension: Secondary | ICD-10-CM | POA: Diagnosis not present

## 2015-10-29 DIAGNOSIS — E119 Type 2 diabetes mellitus without complications: Secondary | ICD-10-CM | POA: Diagnosis not present

## 2015-10-31 DIAGNOSIS — S42201D Unspecified fracture of upper end of right humerus, subsequent encounter for fracture with routine healing: Secondary | ICD-10-CM | POA: Diagnosis not present

## 2015-11-04 DIAGNOSIS — S42201D Unspecified fracture of upper end of right humerus, subsequent encounter for fracture with routine healing: Secondary | ICD-10-CM | POA: Diagnosis not present

## 2015-11-07 DIAGNOSIS — S42201D Unspecified fracture of upper end of right humerus, subsequent encounter for fracture with routine healing: Secondary | ICD-10-CM | POA: Diagnosis not present

## 2015-11-11 DIAGNOSIS — S42201D Unspecified fracture of upper end of right humerus, subsequent encounter for fracture with routine healing: Secondary | ICD-10-CM | POA: Diagnosis not present

## 2015-11-12 DIAGNOSIS — M9902 Segmental and somatic dysfunction of thoracic region: Secondary | ICD-10-CM | POA: Diagnosis not present

## 2015-11-12 DIAGNOSIS — M546 Pain in thoracic spine: Secondary | ICD-10-CM | POA: Diagnosis not present

## 2015-11-12 DIAGNOSIS — M9901 Segmental and somatic dysfunction of cervical region: Secondary | ICD-10-CM | POA: Diagnosis not present

## 2015-11-12 DIAGNOSIS — M542 Cervicalgia: Secondary | ICD-10-CM | POA: Diagnosis not present

## 2015-11-14 ENCOUNTER — Telehealth: Payer: Self-pay | Admitting: Adult Health

## 2015-11-14 DIAGNOSIS — S42201D Unspecified fracture of upper end of right humerus, subsequent encounter for fracture with routine healing: Secondary | ICD-10-CM | POA: Diagnosis not present

## 2015-11-14 NOTE — Telephone Encounter (Signed)
Per MR request OV 08/12/15 faxed to [facility] 11/14/15

## 2015-11-18 DIAGNOSIS — M9901 Segmental and somatic dysfunction of cervical region: Secondary | ICD-10-CM | POA: Diagnosis not present

## 2015-11-18 DIAGNOSIS — M546 Pain in thoracic spine: Secondary | ICD-10-CM | POA: Diagnosis not present

## 2015-11-18 DIAGNOSIS — S42201D Unspecified fracture of upper end of right humerus, subsequent encounter for fracture with routine healing: Secondary | ICD-10-CM | POA: Diagnosis not present

## 2015-11-18 DIAGNOSIS — M9902 Segmental and somatic dysfunction of thoracic region: Secondary | ICD-10-CM | POA: Diagnosis not present

## 2015-11-18 DIAGNOSIS — M542 Cervicalgia: Secondary | ICD-10-CM | POA: Diagnosis not present

## 2015-11-19 ENCOUNTER — Ambulatory Visit: Payer: Medicare Other | Admitting: Podiatry

## 2015-11-19 ENCOUNTER — Encounter: Payer: Self-pay | Admitting: Podiatry

## 2015-11-19 DIAGNOSIS — L608 Other nail disorders: Secondary | ICD-10-CM

## 2015-11-19 DIAGNOSIS — L84 Corns and callosities: Secondary | ICD-10-CM

## 2015-11-19 DIAGNOSIS — E1142 Type 2 diabetes mellitus with diabetic polyneuropathy: Secondary | ICD-10-CM

## 2015-11-19 NOTE — Progress Notes (Signed)
Patient ID: [name], female   DOB: 31-Mar-1930, 82 y.o.   MRN: [id number]  Subjective: This patient presents again at six-week intervals for debridement of pre-ulcerative plantar callus first MPJ bilaterally. She has been instructed to wear diabetic shoes, however, she admits she only wears them occasionally or in the house.  History of fracture vertebrae with pending treatment with a neurosurgeon  Objective: Orientated 3 Patient wearing diabetic shoes today Hammertoe 2-5 bilaterally Bleeding callus plantar first MPJ bilaterally Incurvated toenails 6-10 DP and PT pulses 2/4 bilaterally Decreased sensation to Semmes monofilament wire  Assessment: Diabetic peripheral neuropathy Pre-ulcerative noninfected eschar dorsal second left MPJ Pre-ulcerative plantar callus first MPJ bilaterally Incurvated toenails 6-10  Plan: Today I advised the patient strongly to wear diabetic shoes at all times. The pre-ulcerative plantar callus first MPJ after and the plantar callus right were debrided, bilaterally without any bleeding Debrided incurvated toenails 6-10 mechanically and electronically without any bleeding  Reappoint 6 weeks

## 2015-11-19 NOTE — Patient Instructions (Signed)
Diabetes and Foot Care Diabetes Lorton cause you to have problems because of poor blood supply (circulation) to your feet and legs. This Dirr cause the skin on your feet to become thinner, break easier, and heal more slowly. Your skin Weseman become dry, and the skin Steffek peel and crack. You Carden also have nerve damage in your legs and feet causing decreased feeling in them. You Aikey not notice minor injuries to your feet that could lead to infections or more serious problems. Taking care of your feet is one of the most important things you can do for yourself.  HOME CARE INSTRUCTIONS  Wear shoes at all times, even in the house. Do not go barefoot. Bare feet are easily injured.  Check your feet daily for blisters, cuts, and redness. If you cannot see the bottom of your feet, use a mirror or ask someone for help.  Wash your feet with warm water (do not use hot water) and mild soap. Then pat your feet and the areas between your toes until they are completely dry. Do not soak your feet as this can dry your skin.  Apply a moisturizing lotion or petroleum jelly (that does not contain alcohol and is unscented) to the skin on your feet and to dry, brittle toenails. Do not apply lotion between your toes.  Trim your toenails straight across. Do not dig under them or around the cuticle. File the edges of your nails with an emery board or nail file.  Do not cut corns or calluses or try to remove them with medicine.  Wear clean socks or stockings every day. Make sure they are not too tight. Do not wear knee-high stockings since they Franchini decrease blood flow to your legs.  Wear shoes that fit properly and have enough cushioning. To break in new shoes, wear them for just a few hours a day. This prevents you from injuring your feet. Always look in your shoes before you put them on to be sure there are no objects inside.  Do not cross your legs. This Mastrangelo decrease the blood flow to your feet.  If you find a minor scrape,  cut, or break in the skin on your feet, keep it and the skin around it clean and dry. These areas Billy be cleansed with mild soap and water. Do not cleanse the area with peroxide, alcohol, or iodine.  When you remove an adhesive bandage, be sure not to damage the skin around it.  If you have a wound, look at it several times a day to make sure it is healing.  Do not use heating pads or hot water bottles. They Escamilla burn your skin. If you have lost feeling in your feet or legs, you Jenniges not know it is happening until it is too late.  Make sure your health care provider performs a complete foot exam at least annually or more often if you have foot problems. Report any cuts, sores, or bruises to your health care provider immediately. SEEK MEDICAL CARE IF:   You have an injury that is not healing.  You have cuts or breaks in the skin.  You have an ingrown nail.  You notice redness on your legs or feet.  You feel burning or tingling in your legs or feet.  You have pain or cramps in your legs and feet.  Your legs or feet are numb.  Your feet always feel cold. SEEK IMMEDIATE MEDICAL CARE IF:   There is increasing redness,   swelling, or pain in or around a wound.  There is a red line that goes up your leg.  Pus is coming from a wound.  You develop a fever or as directed by your health care provider.  You notice a bad smell coming from an ulcer or wound.   This information is not intended to replace advice given to you by your health care provider. Make sure you discuss any questions you have with your health care provider.   Document Released: 04/17/2000 Document Revised: 12/21/2012 Document Reviewed: 09/27/2012 Elsevier Interactive Patient Education 2016 [employer]  

## 2015-11-21 DIAGNOSIS — S42201D Unspecified fracture of upper end of right humerus, subsequent encounter for fracture with routine healing: Secondary | ICD-10-CM | POA: Diagnosis not present

## 2015-11-25 DIAGNOSIS — S32001A Stable burst fracture of unspecified lumbar vertebra, initial encounter for closed fracture: Secondary | ICD-10-CM | POA: Diagnosis not present

## 2015-11-25 DIAGNOSIS — S42201D Unspecified fracture of upper end of right humerus, subsequent encounter for fracture with routine healing: Secondary | ICD-10-CM | POA: Diagnosis not present

## 2015-11-25 DIAGNOSIS — I10 Essential (primary) hypertension: Secondary | ICD-10-CM | POA: Diagnosis not present

## 2015-11-28 DIAGNOSIS — S42201A Unspecified fracture of upper end of right humerus, initial encounter for closed fracture: Secondary | ICD-10-CM | POA: Diagnosis not present

## 2015-11-29 DIAGNOSIS — M47814 Spondylosis without myelopathy or radiculopathy, thoracic region: Secondary | ICD-10-CM | POA: Diagnosis not present

## 2015-11-29 DIAGNOSIS — M5126 Other intervertebral disc displacement, lumbar region: Secondary | ICD-10-CM | POA: Diagnosis not present

## 2015-11-29 DIAGNOSIS — S32001A Stable burst fracture of unspecified lumbar vertebra, initial encounter for closed fracture: Secondary | ICD-10-CM | POA: Diagnosis not present

## 2015-12-02 DIAGNOSIS — S42201D Unspecified fracture of upper end of right humerus, subsequent encounter for fracture with routine healing: Secondary | ICD-10-CM | POA: Diagnosis not present

## 2015-12-03 DIAGNOSIS — H5310 Unspecified subjective visual disturbances: Secondary | ICD-10-CM | POA: Diagnosis not present

## 2015-12-05 DIAGNOSIS — S42201D Unspecified fracture of upper end of right humerus, subsequent encounter for fracture with routine healing: Secondary | ICD-10-CM | POA: Diagnosis not present

## 2015-12-09 DIAGNOSIS — S42201D Unspecified fracture of upper end of right humerus, subsequent encounter for fracture with routine healing: Secondary | ICD-10-CM | POA: Diagnosis not present

## 2015-12-10 DIAGNOSIS — L821 Other seborrheic keratosis: Secondary | ICD-10-CM | POA: Diagnosis not present

## 2015-12-10 DIAGNOSIS — D1801 Hemangioma of skin and subcutaneous tissue: Secondary | ICD-10-CM | POA: Diagnosis not present

## 2015-12-10 DIAGNOSIS — D235 Other benign neoplasm of skin of trunk: Secondary | ICD-10-CM | POA: Diagnosis not present

## 2015-12-10 DIAGNOSIS — L820 Inflamed seborrheic keratosis: Secondary | ICD-10-CM | POA: Diagnosis not present

## 2015-12-12 DIAGNOSIS — S32001A Stable burst fracture of unspecified lumbar vertebra, initial encounter for closed fracture: Secondary | ICD-10-CM | POA: Diagnosis not present

## 2015-12-12 DIAGNOSIS — S42201D Unspecified fracture of upper end of right humerus, subsequent encounter for fracture with routine healing: Secondary | ICD-10-CM | POA: Diagnosis not present

## 2015-12-16 ENCOUNTER — Ambulatory Visit (INDEPENDENT_AMBULATORY_CARE_PROVIDER_SITE_OTHER): Payer: Medicare Other | Admitting: Endocrinology

## 2015-12-16 ENCOUNTER — Encounter: Payer: Self-pay | Admitting: Endocrinology

## 2015-12-16 ENCOUNTER — Other Ambulatory Visit: Payer: Self-pay

## 2015-12-16 VITALS — BP 143/83 | HR 91 | Temp 98.1°F | Ht 68.0 in | Wt 138.8 lb

## 2015-12-16 DIAGNOSIS — E1151 Type 2 diabetes mellitus with diabetic peripheral angiopathy without gangrene: Secondary | ICD-10-CM

## 2015-12-16 DIAGNOSIS — E1310 Other specified diabetes mellitus with ketoacidosis without coma: Secondary | ICD-10-CM

## 2015-12-16 LAB — POCT GLYCOSYLATED HEMOGLOBIN (HGB A1C): Hemoglobin A1C: 7.7

## 2015-12-16 LAB — BASIC METABOLIC PANEL
BUN: 14 mg/dL (ref 6–23)
CO2: 27 mEq/L (ref 19–32)
Calcium: 9.1 mg/dL (ref 8.4–10.5)
Chloride: 101 mEq/L (ref 96–112)
Creatinine, Ser: 0.87 mg/dL (ref 0.40–1.20)
GFR: 65.97 mL/min (ref >60.00)
Glucose, Bld: 265 mg/dL — ABNORMAL HIGH (ref 70–99)
Potassium: 3.9 mEq/L (ref 3.5–5.1)
Sodium: 138 mEq/L (ref 135–145)

## 2015-12-16 LAB — TSH: TSH: 1.84 u[IU]/mL (ref 0.35–4.50)

## 2015-12-16 MED ORDER — PRANDIN 1 MG PO TABS: 1.0000 mg | ORAL_TABLET | Freq: Three times a day (TID) | ORAL | 3 refills | Status: DC

## 2015-12-16 NOTE — Patient Instructions (Addendum)
check your blood sugar once a day.  vary the time of day when you check, between before the 3 meals, and at bedtime.  also check if you have symptoms of your blood sugar being too high or too low.  please keep a record of the readings and bring it to your next appointment here (or you can bring the meter itself).  You can write it on any piece of paper.  please call us sooner if your blood sugar goes below 70, or if you have a lot of readings over 200.   Please stop taking the pioglitizone.   We will also need to stop the glyburide with time.   Please come back for a follow-up appointment in 2 months.

## 2015-12-16 NOTE — Progress Notes (Signed)
Subjective:    Patient ID: [name], female    DOB: [birth year], 82 y.o.   MRN: [id number]  HPI Pt returns for f/u of diabetes mellitus:  DM type: 2 Dx'ed: 3704 Complications: polyneuropathy and PAD.   Therapy: 3 oral meds DKA: never Severe hypoglycemia: never.   Pancreatitis: never.   Other: she took insulin for only a brief time after dx; she declines [place]; pt says she wants brand-name starlix, as the tab resembles another med; she declines prandin; she can tolerate metformin-XR only 1000/d--she gets diarrhea if she takes any more.   Interval history:  She declines generic starlix.  no cbg record, but states cbg's are approx 200.  pt states she feels well in general. Pt has multinodular goiter (after a syncopal episode in April of 2015, she was eval with carotid US, and there was incidentally noted to have nodules in the thyroid; she is euthyroid; bx was not done, due to advanced age and FHx of benign goiter; [place] in late 2015 was not significantly changed).   She has slight swelling of the legs, but no assoc SOB.   Past Medical History:  Diagnosis Date  . Anemia   . Anxiety   . Cancer of right breast ([place]) 12/26/12   right breast 12:00 o'clock, DCIS  . Chronic cough   . Chronic facial pain   . Chronic foot pain   . Complication of anesthesia    Sore jaw; could not chew or move mouth  . Convulsions/seizures ([place]) 10/16/2014  . Diabetes mellitus    type 2 niddm x 20 years  . Dyslipidemia   . Ejection fraction   . GERD (gastroesophageal reflux disease)   . Hammer toe    bilateral  . History of colonic polyps   . HTN (hypertension)   . Hx of radiation therapy 03/07/13- 03/29/13   right breast 4250 cGy 17 sessions  . Hyperlipidemia   . Melanoma ([place])   . Metatarsal bone fracture 2014  . Obesity   . Osteoarthritis   . Palpitations   . Seasonal allergies   . Skin cancer   . Vitamin B12 deficiency     Past Surgical History:  Procedure Laterality Date  . ABDOMINAL  HYSTERECTOMY    . BREAST BIOPSY Right 01/24/2013   Procedure: RE-EXCICION OF BREAST CANCER, ANTERIOR MARGINS;  Surgeon: Edward Jolly, MD;  Location: [facility];  Service: General;  Laterality: Right;  . BREAST LUMPECTOMY WITH NEEDLE LOCALIZATION Right 01/17/2013   Procedure: BREAST LUMPECTOMY WITH NEEDLE LOCALIZATION;  Surgeon: Edward Jolly, MD;  Location: [facility];  Service: General;  Laterality: Right;  . BUNIONECTOMY    . CATARACT EXTRACTION W/ INTRAOCULAR LENS IMPLANT Right   . CHOLECYSTECTOMY    . COLONOSCOPY    . CRANIOTOMY Right 10/18/2013   Procedure: CRANIOTOMY TUMOR EXCISION;  Surgeon: Floyce Stakes, MD;  Location: [facility];  Service: Neurosurgery;  Laterality: Right;  . EYE SURGERY    . HERNIA REPAIR    . KNEE ARTHROSCOPY Bilateral   . POLYPECTOMY     small adenomatous  . ULNAR TUNNEL RELEASE      Social History   Social History  . Marital status: Widowed    Spouse name: N/A  . Number of children: 0  . Years of education: college   Occupational History  . retired    Social History Main Topics  . Smoking status: Never Smoker  . Smokeless tobacco: Never Used  . Alcohol use  No  . Drug use: No  . Sexual activity: Yes     Comment: menarche age 69, hysterectomy age 59, HRT x 2-3 mos, G1- miscarriage   Other Topics Concern  . Not on file   Social History Narrative   Widowed, lives alone.  Ambulates independently.    Drinks decaf only   Patient is right handed.    Current Outpatient Prescriptions on File Prior to Visit  Medication Sig Dispense Refill  . acetaminophen (TYLENOL) 500 MG tablet Take 1,000 mg by mouth every 6 (six) hours as needed for pain.    Marland Kitchen ALPRAZolam (XANAX) 0.25 MG tablet Take 1 tablet (0.25 mg total) by mouth at bedtime. 30 tablet 1  . Blood Glucose Monitoring Suppl (PRODIGY VOICE BLOOD GLUCOSE) w/Device KIT Use to check blood sugar 1 time per day. 1 each 2  . cholecalciferol (VITAMIN D) 1000 units tablet Take 1,000 Units by mouth  daily.    . colestipol (COLESTID) 1 G tablet Take 1-2 g by mouth 3 (three) times daily. Take 2 tablets in the morning, 1 tablet at noon, and 2 tablets in the evening    . cyanocobalamin (,VITAMIN B-12,) 1000 MCG/ML injection INJECT 1 ML INTRAMUSCULARLY EVERY 21 DAYS 3 mL 2  . fexofenadine (ALLEGRA) 60 MG tablet Take 60 mg by mouth daily as needed for allergies.     . furosemide (LASIX) 20 MG tablet Take 1 tablet (20 mg total) by mouth daily. Do not take until I call you about labs. (Patient taking differently: Take 20 mg by mouth daily as needed for fluid or edema. Do not take until I call you about labs.) 30 tablet 0  . glucose blood test strip Use to check blood sugar 1 time per day. 100 each 3  . lidocaine (LIDODERM) 5 % Place 1 patch onto the skin daily. Remove & Discard patch within 12 hours or as directed by MD (Patient taking differently: Place 1 patch onto the skin daily as needed (pain). Remove & Discard patch within 12 hours or as directed by MD) 90 patch 1  . metFORMIN (GLUCOPHAGE-XR) 500 MG 24 hr tablet Take 2 tablets (1,000 mg total) by mouth daily with breakfast. 180 tablet 3  . OVER THE COUNTER MEDICATION Take 1 capsule by mouth daily. Recall Max memory supplement    . phenytoin (DILANTIN) 100 MG ER capsule TAKE 3 CAPSULES AT BEDTIME 270 capsule 3  . phenytoin (DILANTIN) 50 MG tablet Chew 1 tablet (50 mg total) by mouth daily. 15 tablet 1  . PHENYTOIN INFATABS 50 MG tablet CHEW 1 TABLET DAILY 90 tablet 0  . Polyethyl Glycol-Propyl Glycol 0.4-0.3 % SOLN Place 2 drops into both eyes at bedtime.     . ranitidine (ZANTAC) 150 MG capsule Take 150 mg by mouth daily as needed for heartburn.     Marland Kitchen TARKA 2-180 MG per tablet TAKE 1 TABLET DAILY 90 tablet 1  . traMADol (ULTRAM) 50 MG tablet Take by mouth every 6 (six) hours as needed.    . calcium carbonate (OS-CAL) 600 MG TABS Take 600 mg by mouth daily. Reported on 09/03/2015    . escitalopram (LEXAPRO) 5 MG tablet Take 1 tablet by mouth daily.  Reported on 09/03/2015     No current facility-administered medications on file prior to visit.     Allergies  Allergen Reactions  . Bystolic [Nebivolol Hcl] Other (See Comments)    "extreme weakness, heaviness in legs & arms, swelling in legs/arms/face, swollen abdomen, pain in  bladder, feet pain, soreness in chest"  . Cholestyramine     "itching rash on stomach, bloated, nausea, vomiting, sleeplessness, extreme pain in arms"  . Hydrazine Yellow [Tartrazine] Other (See Comments)    "does not reduce high blood pressure, pain in arm, high pressure, felt like I was on verge of heart attack, really weak"  . Morphine Other (See Comments)    Feels morbid, weak, still in pain  . Niacin Palpitations    Fast heart beat  . Niaspan [Niacin Er] Palpitations and Other (See Comments)    "fast heart beat, high blood pressure"  . Norvasc [Amlodipine Besylate] Other (See Comments)    "extreme fluid retention/pain)  . Optivar [Azelastine Hcl] Photosensitivity  . Sular [Nisoldipine Er] Other (See Comments)    "severe headaches, swelling eyes, hands, feet, shortness of breath, weak, flushed face, brain boiling, fluid retention, high blood sugar, nervous, heart fast beating"  . Tegretol [Carbamazepine] Other (See Comments)    Blood poisoning   . Telmisartan Other (See Comments)    "headache, difficulty urinating, high blood sugar, fluid retention"  . Cefdinir Swelling    Vaginal irritation, breathing,   . Clonidine Other (See Comments)    Dry mouth, fluid retention  . Clonidine Hydrochloride Other (See Comments)    Dry mouth, fluid retention  . Codeine Nausea And Vomiting  . Ezetimibe Other (See Comments)    Made weak  . Naproxen Other (See Comments)    Shrinks bladder  . Ziac [Bisoprolol-Hydrochlorothiazide] Other (See Comments)    "stopped urination"  . Elavil [Amitriptyline] Other (See Comments)    Gave Pt nightmares  . Keppra [Levetiracetam] Other (See Comments)    Shaking  . Lamictal  [Lamotrigine]     itching  . Lyrica [Pregabalin] Swelling  . Topamax [Topiramate]     Dry eyes  . Ace Inhibitors Other (See Comments)    unknown  . Actonel [Risedronate Sodium] Other (See Comments)    unknown  . Amlodipine Besylate Other (See Comments)    unknown  . Aspirin Other (See Comments)    unknown  . Atacand [Candesartan] Other (See Comments)    unknown  . Bextra [Valdecoxib] Other (See Comments)    unknown  . Bisoprolol-Hydrochlorothiazide Other (See Comments)    unknown  . Candesartan Cilexetil Other (See Comments)    unknown  . Cefadroxil Other (See Comments)    unknown  . Celecoxib Rash  . Hydrocodone Other (See Comments)    unknown  . Hydrocodone-Acetaminophen Other (See Comments)    unknown  . Iodinated Diagnostic Agents Rash    "All over"  . Meloxicam Other (See Comments)    unknown  . Methylprednisolone Sodium Succinate Other (See Comments)    unknown  . Nabumetone Other (See Comments)    unknown  . Penicillins Other (See Comments)    unknown  . Pseudoephedrine-Guaifenesin Other (See Comments)    unknown  . Risedronate Sodium Other (See Comments)    unknown  . Rofecoxib Other (See Comments)    unknown  . Ru-Tuss [Chlorphen-Pse-Atrop-Hyos-Scop] Other (See Comments)    unknown  . Sulfonamide Derivatives Other (See Comments)    unknown  . Sulphur [Sulfur] Other (See Comments)    unknown  . Telithromycin Other (See Comments)    unknown  . Terfenadine Other (See Comments)    unknown  . Trandolapril-Verapamil Hcl Er Other (See Comments)    Headache, difficulty urinating, high blood sugar, fluid retention  Pt is taking Tarka (trandolapril-verapamil) currently, but requests the  medication stay in her allergy list  . Valium [Diazepam] Other (See Comments)    Makes her mean and hyper    Family History  Problem Relation Age of Onset  . Colon cancer    . Heart disease Mother   . Diabetes Father   . Pancreatic cancer Father   . Bone cancer Sister    . Prostate cancer Brother   . Colon cancer Brother   . Rectal cancer Sister   . Thyroid disease Sister     benign goiter resected    BP (!) 143/83   Pulse 91   Temp 98.1 F (36.7 C) (Oral)   Ht '5\' 8"'  (1.727 m)   Wt 138 lb 12.8 oz (63 kg)   SpO2 96%   BMI 21.10 kg/m   Review of Systems She denies hypoglycemia.      Objective:   Physical Exam VITAL SIGNS:  See vs page.   GENERAL: no distress.   Ext: 1+ bilat leg edema.    Lab Results  Component Value Date   HGBA1C 7.7 12/16/2015   Lab Results  Component Value Date   CREATININE 0.87 12/16/2015   BUN 14 12/16/2015   NA 138 12/16/2015   K 3.9 12/16/2015   CL 101 12/16/2015   CO2 27 12/16/2015      Assessment & Plan:  Edema, new DM: this is the best control this pt should aim for, given this SU-containing regimen.  Renal failure: mild, but enough to need to d/c glyburide with time.

## 2015-12-16 NOTE — Progress Notes (Signed)
Pre visit review using our clinic tool,if applicable. No additional management support is needed unless otherwise documented below in the visit note.  

## 2015-12-17 ENCOUNTER — Other Ambulatory Visit: Payer: Self-pay | Admitting: *Deleted

## 2015-12-17 DIAGNOSIS — H5310 Unspecified subjective visual disturbances: Secondary | ICD-10-CM | POA: Diagnosis not present

## 2015-12-17 MED ORDER — LEVOTHYROXINE SODIUM 50 MCG PO TABS: 50.0000 ug | ORAL_TABLET | Freq: Every day | ORAL | 1 refills | Status: DC

## 2015-12-24 ENCOUNTER — Encounter: Payer: Self-pay | Admitting: Podiatry

## 2015-12-24 ENCOUNTER — Ambulatory Visit (INDEPENDENT_AMBULATORY_CARE_PROVIDER_SITE_OTHER): Payer: Medicare Other | Admitting: Podiatry

## 2015-12-24 DIAGNOSIS — L609 Nail disorder, unspecified: Secondary | ICD-10-CM | POA: Diagnosis not present

## 2015-12-24 DIAGNOSIS — E1142 Type 2 diabetes mellitus with diabetic polyneuropathy: Secondary | ICD-10-CM | POA: Diagnosis not present

## 2015-12-24 DIAGNOSIS — L84 Corns and callosities: Secondary | ICD-10-CM | POA: Diagnosis not present

## 2015-12-24 DIAGNOSIS — L608 Other nail disorders: Secondary | ICD-10-CM

## 2015-12-24 NOTE — Patient Instructions (Signed)
Diabetes and Foot Care Diabetes Modeste cause you to have problems because of poor blood supply (circulation) to your feet and legs. This Glasper cause the skin on your feet to become thinner, break easier, and heal more slowly. Your skin Benjamin become dry, and the skin Font peel and crack. You Rill also have nerve damage in your legs and feet causing decreased feeling in them. You Hum not notice minor injuries to your feet that could lead to infections or more serious problems. Taking care of your feet is one of the most important things you can do for yourself.  HOME CARE INSTRUCTIONS  Wear shoes at all times, even in the house. Do not go barefoot. Bare feet are easily injured.  Check your feet daily for blisters, cuts, and redness. If you cannot see the bottom of your feet, use a mirror or ask someone for help.  Wash your feet with warm water (do not use hot water) and mild soap. Then pat your feet and the areas between your toes until they are completely dry. Do not soak your feet as this can dry your skin.  Apply a moisturizing lotion or petroleum jelly (that does not contain alcohol and is unscented) to the skin on your feet and to dry, brittle toenails. Do not apply lotion between your toes.  Trim your toenails straight across. Do not dig under them or around the cuticle. File the edges of your nails with an emery board or nail file.  Do not cut corns or calluses or try to remove them with medicine.  Wear clean socks or stockings every day. Make sure they are not too tight. Do not wear knee-high stockings since they Journey decrease blood flow to your legs.  Wear shoes that fit properly and have enough cushioning. To break in new shoes, wear them for just a few hours a day. This prevents you from injuring your feet. Always look in your shoes before you put them on to be sure there are no objects inside.  Do not cross your legs. This Durnil decrease the blood flow to your feet.  If you find a minor scrape,  cut, or break in the skin on your feet, keep it and the skin around it clean and dry. These areas Otero be cleansed with mild soap and water. Do not cleanse the area with peroxide, alcohol, or iodine.  When you remove an adhesive bandage, be sure not to damage the skin around it.  If you have a wound, look at it several times a day to make sure it is healing.  Do not use heating pads or hot water bottles. They Doolan burn your skin. If you have lost feeling in your feet or legs, you Chevalier not know it is happening until it is too late.  Make sure your health care provider performs a complete foot exam at least annually or more often if you have foot problems. Report any cuts, sores, or bruises to your health care provider immediately. SEEK MEDICAL CARE IF:   You have an injury that is not healing.  You have cuts or breaks in the skin.  You have an ingrown nail.  You notice redness on your legs or feet.  You feel burning or tingling in your legs or feet.  You have pain or cramps in your legs and feet.  Your legs or feet are numb.  Your feet always feel cold. SEEK IMMEDIATE MEDICAL CARE IF:   There is increasing redness,   swelling, or pain in or around a wound.  There is a red line that goes up your leg.  Pus is coming from a wound.  You develop a fever or as directed by your health care provider.  You notice a bad smell coming from an ulcer or wound.   This information is not intended to replace advice given to you by your health care provider. Make sure you discuss any questions you have with your health care provider.   Document Released: 04/17/2000 Document Revised: 12/21/2012 Document Reviewed: 09/27/2012 Elsevier Interactive Patient Education 2016 Elsevier Inc.  

## 2015-12-24 NOTE — Progress Notes (Signed)
Patient ID: [name], female   DOB: 1930-04-14, 82 y.o.   MRN: [id number]    Subjective: This patient presents again at six-week intervals for debridement of pre-ulcerative plantar callus first MPJ bilaterally. She has been instructed to wear diabetic shoes, however, she admits she only wears them occasionally or in the house.  History of fracture vertebrae with pending treatment with a neurosurgeon  Objective: Orientated 3 Patient wearing diabetic shoes today Hammertoe 2-5 bilaterally Bleeding callus plantar first MPJ bilaterally Incurvated toenails 6-10 DP and PT pulses 2/4 bilaterally Decreased sensation to Semmes monofilament wire  Assessment: Diabetic peripheral neuropathy Pre-ulcerative noninfected eschar dorsal second left MPJ Pre-ulcerative plantar callus first MPJ bilaterally Incurvated toenails 6-10  Plan: Today I advised the patient strongly to wear diabetic shoes at all times. The pre-ulcerative plantar callus first MPJ after and the plantar callus right were debrided, bilaterally without any bleeding Debrided incurvated toenails 6-10 mechanically and electronically without any bleeding  Reappoint 6 weeks

## 2015-12-30 DIAGNOSIS — Z23 Encounter for immunization: Secondary | ICD-10-CM | POA: Diagnosis not present

## 2015-12-30 DIAGNOSIS — S22000A Wedge compression fracture of unspecified thoracic vertebra, initial encounter for closed fracture: Secondary | ICD-10-CM | POA: Diagnosis not present

## 2015-12-31 ENCOUNTER — Ambulatory Visit: Payer: Medicare Other | Admitting: Podiatry

## 2015-12-31 ENCOUNTER — Encounter: Payer: Self-pay | Admitting: Endocrinology

## 2015-12-31 DIAGNOSIS — Z961 Presence of intraocular lens: Secondary | ICD-10-CM | POA: Diagnosis not present

## 2015-12-31 DIAGNOSIS — H524 Presbyopia: Secondary | ICD-10-CM | POA: Diagnosis not present

## 2015-12-31 DIAGNOSIS — E119 Type 2 diabetes mellitus without complications: Secondary | ICD-10-CM | POA: Diagnosis not present

## 2015-12-31 LAB — HM DIABETES EYE EXAM

## 2016-01-08 ENCOUNTER — Telehealth: Payer: Self-pay | Admitting: Neurology

## 2016-01-08 NOTE — Telephone Encounter (Signed)
Patient paged on call. She is "numb all over today". She is numb it started with the arms and is all over her whole body, even her face and head and "everywhere" symmetrically. It started this afternoon. No focal weakness. She has been tired otherwise she is fine at her baseline in no distress whatsoever. No fevers, no new pain, no dizziness, no changes in bowel or bladder, no other new deficits today, no vision changes, no alteration of consciousness, no other problems, she is ambulating at baseline. I told her if it gets worse, if she has breathing difficulty, worsened generalized weakness, fever, chills, headache, focal weakness, slurred speech or anything else she should call 911. She should also have a family or friend with her tonight until the morning. Will let Dr. Jannifer Franklin know to call her in the morning.

## 2016-01-09 NOTE — Telephone Encounter (Signed)
I called patient. She is feeling back to normal now. She had not eaten the afternoon that this total body numbness started. She did not check her blood sugars. The patient will contact me if this is a recurring problem, she was concerned that this is related to Dilantin, but she has been on this medication for quite some time. We will watch for now.

## 2016-01-10 ENCOUNTER — Other Ambulatory Visit: Payer: Self-pay | Admitting: Neurology

## 2016-01-15 ENCOUNTER — Other Ambulatory Visit: Payer: Self-pay | Admitting: Neurology

## 2016-01-21 DIAGNOSIS — M546 Pain in thoracic spine: Secondary | ICD-10-CM | POA: Diagnosis not present

## 2016-01-21 DIAGNOSIS — G8929 Other chronic pain: Secondary | ICD-10-CM | POA: Diagnosis not present

## 2016-01-27 DIAGNOSIS — Z136 Encounter for screening for cardiovascular disorders: Secondary | ICD-10-CM | POA: Diagnosis not present

## 2016-01-27 DIAGNOSIS — I10 Essential (primary) hypertension: Secondary | ICD-10-CM | POA: Diagnosis not present

## 2016-01-27 DIAGNOSIS — Z79899 Other long term (current) drug therapy: Secondary | ICD-10-CM | POA: Diagnosis not present

## 2016-01-27 DIAGNOSIS — E039 Hypothyroidism, unspecified: Secondary | ICD-10-CM | POA: Diagnosis not present

## 2016-01-28 DIAGNOSIS — H5310 Unspecified subjective visual disturbances: Secondary | ICD-10-CM | POA: Diagnosis not present

## 2016-02-03 DIAGNOSIS — J309 Allergic rhinitis, unspecified: Secondary | ICD-10-CM | POA: Diagnosis not present

## 2016-02-03 DIAGNOSIS — Z0000 Encounter for general adult medical examination without abnormal findings: Secondary | ICD-10-CM | POA: Diagnosis not present

## 2016-02-03 DIAGNOSIS — I10 Essential (primary) hypertension: Secondary | ICD-10-CM | POA: Diagnosis not present

## 2016-02-03 DIAGNOSIS — E119 Type 2 diabetes mellitus without complications: Secondary | ICD-10-CM | POA: Diagnosis not present

## 2016-02-04 ENCOUNTER — Ambulatory Visit (INDEPENDENT_AMBULATORY_CARE_PROVIDER_SITE_OTHER): Payer: Medicare Other | Admitting: Podiatry

## 2016-02-04 ENCOUNTER — Encounter: Payer: Self-pay | Admitting: Podiatry

## 2016-02-04 VITALS — BP 126/58 | HR 52 | Resp 16

## 2016-02-04 DIAGNOSIS — B351 Tinea unguium: Secondary | ICD-10-CM | POA: Diagnosis not present

## 2016-02-04 DIAGNOSIS — L608 Other nail disorders: Secondary | ICD-10-CM

## 2016-02-04 DIAGNOSIS — E1142 Type 2 diabetes mellitus with diabetic polyneuropathy: Secondary | ICD-10-CM

## 2016-02-04 DIAGNOSIS — L84 Corns and callosities: Secondary | ICD-10-CM | POA: Diagnosis not present

## 2016-02-04 NOTE — Progress Notes (Signed)
Patient ID: [name], female   DOB: 1929-12-04, 82 y.o.   MRN: [id number]    Subjective: This patient presents again at six-week intervals for debridement of pre-ulcerative plantar callus first MPJ bilaterally. She has been instructed to wear diabetic shoes, however, she admits she only wears them occasionally or in the house.  History of fracture vertebrae with pending treatment with a neurosurgeon  Objective: Orientated 3 Hammertoe 2-5 bilaterally Bleeding callus plantar first MPJ bilaterally Incurvated toenails 6-10 DP and PT pulses 2/4 bilaterally Decreased sensation to Semmes monofilament wire Incurvated toenails 6-10   Assessment: Diabetic peripheral neuropathy Pre-ulcerative plantar callus first MPJ bilaterally Incurvated toenails 6-10  Plan: Today I advised the patient strongly to wear diabetic shoes at all times. The pre-ulcerative plantar callus first MPJ after and the plantar callus right were debrided, bilaterally without any bleeding Debrided incurvated toenails 6-10 mechanically and electronically without any bleeding Start to wear diabetic shoes on a daily basis  Reappoint 6 weeks

## 2016-02-04 NOTE — Patient Instructions (Signed)
Diabetes and Foot Care Diabetes Glahn cause you to have problems because of poor blood supply (circulation) to your feet and legs. This Hunnell cause the skin on your feet to become thinner, break easier, and heal more slowly. Your skin Beier become dry, and the skin Gouge peel and crack. You Bobo also have nerve damage in your legs and feet causing decreased feeling in them. You Bushey not notice minor injuries to your feet that could lead to infections or more serious problems. Taking care of your feet is one of the most important things you can do for yourself.  HOME CARE INSTRUCTIONS  Wear shoes at all times, even in the house. Do not go barefoot. Bare feet are easily injured.  Check your feet daily for blisters, cuts, and redness. If you cannot see the bottom of your feet, use a mirror or ask someone for help.  Wash your feet with warm water (do not use hot water) and mild soap. Then pat your feet and the areas between your toes until they are completely dry. Do not soak your feet as this can dry your skin.  Apply a moisturizing lotion or petroleum jelly (that does not contain alcohol and is unscented) to the skin on your feet and to dry, brittle toenails. Do not apply lotion between your toes.  Trim your toenails straight across. Do not dig under them or around the cuticle. File the edges of your nails with an emery board or nail file.  Do not cut corns or calluses or try to remove them with medicine.  Wear clean socks or stockings every day. Make sure they are not too tight. Do not wear knee-high stockings since they Wingard decrease blood flow to your legs.  Wear shoes that fit properly and have enough cushioning. To break in new shoes, wear them for just a few hours a day. This prevents you from injuring your feet. Always look in your shoes before you put them on to be sure there are no objects inside.  Do not cross your legs. This Schroeck decrease the blood flow to your feet.  If you find a minor scrape,  cut, or break in the skin on your feet, keep it and the skin around it clean and dry. These areas Mccaskey be cleansed with mild soap and water. Do not cleanse the area with peroxide, alcohol, or iodine.  When you remove an adhesive bandage, be sure not to damage the skin around it.  If you have a wound, look at it several times a day to make sure it is healing.  Do not use heating pads or hot water bottles. They Peacock burn your skin. If you have lost feeling in your feet or legs, you Basey not know it is happening until it is too late.  Make sure your health care provider performs a complete foot exam at least annually or more often if you have foot problems. Report any cuts, sores, or bruises to your health care provider immediately. SEEK MEDICAL CARE IF:   You have an injury that is not healing.  You have cuts or breaks in the skin.  You have an ingrown nail.  You notice redness on your legs or feet.  You feel burning or tingling in your legs or feet.  You have pain or cramps in your legs and feet.  Your legs or feet are numb.  Your feet always feel cold. SEEK IMMEDIATE MEDICAL CARE IF:   There is increasing redness,   swelling, or pain in or around a wound.  There is a red line that goes up your leg.  Pus is coming from a wound.  You develop a fever or as directed by your health care provider.  You notice a bad smell coming from an ulcer or wound.   This information is not intended to replace advice given to you by your health care provider. Make sure you discuss any questions you have with your health care provider.   Document Released: 04/17/2000 Document Revised: 12/21/2012 Document Reviewed: 09/27/2012 Elsevier Interactive Patient Education 2016 Elsevier Inc.  

## 2016-02-10 DIAGNOSIS — M546 Pain in thoracic spine: Secondary | ICD-10-CM | POA: Diagnosis not present

## 2016-02-10 DIAGNOSIS — S32001A Stable burst fracture of unspecified lumbar vertebra, initial encounter for closed fracture: Secondary | ICD-10-CM | POA: Diagnosis not present

## 2016-02-11 ENCOUNTER — Ambulatory Visit (INDEPENDENT_AMBULATORY_CARE_PROVIDER_SITE_OTHER): Payer: Medicare Other | Admitting: Endocrinology

## 2016-02-11 ENCOUNTER — Telehealth: Payer: Self-pay | Admitting: Endocrinology

## 2016-02-11 ENCOUNTER — Encounter: Payer: Self-pay | Admitting: Endocrinology

## 2016-02-11 VITALS — Ht 68.0 in | Wt 135.0 lb

## 2016-02-11 DIAGNOSIS — S22080A Wedge compression fracture of T11-T12 vertebra, initial encounter for closed fracture: Secondary | ICD-10-CM

## 2016-02-11 DIAGNOSIS — E1151 Type 2 diabetes mellitus with diabetic peripheral angiopathy without gangrene: Secondary | ICD-10-CM | POA: Diagnosis not present

## 2016-02-11 LAB — POCT GLYCOSYLATED HEMOGLOBIN (HGB A1C): Hemoglobin A1C: 7.3

## 2016-02-11 MED ORDER — PRANDIN 2 MG PO TABS: 2.0000 mg | ORAL_TABLET | Freq: Three times a day (TID) | ORAL | 11 refills | Status: DC

## 2016-02-11 MED ORDER — METFORMIN HCL ER 500 MG PO TB24: 500.0000 mg | ORAL_TABLET | Freq: Every day | ORAL | 3 refills | Status: DC

## 2016-02-11 NOTE — Progress Notes (Signed)
Subjective:    Patient ID: [name], female    DOB: 02-Nov-1929, 82 y.o.   MRN: [id number]  HPI Pt returns for f/u of diabetes mellitus:  DM type: 2 Dx'ed: 4801 Complications: polyneuropathy and PAD.   Therapy: 3 oral meds.  DKA: never Severe hypoglycemia: never.   Pancreatitis: never.   Other: she took insulin for only a brief time after dx; she declines [place]; pt says she wants brand-name starlix, as the tab resembles another med; she declines prandin; she can tolerate metformin-XR only 1000/d--she gets diarrhea if she takes any more; she had to d/c pioglitizone, due to edema.   Interval history:  She received a steroid injection into the lower back yesterday.  Since then, cbg's have increased to the 200's.  She still has intermittent diarrhea.   Pt has multinodular goiter (after a syncopal episode in April of 2015, she was eval with carotid US, and there was incidentally noted to have nodules in the thyroid; she is euthyroid; bx was not done, due to advanced age and FHx of benign goiter; [place] in late 2015 was not significantly changed).   Past Medical History:  Diagnosis Date  . Anemia   . Anxiety   . Cancer of right breast ([place]) 12/26/12   right breast 12:00 o'clock, DCIS  . Chronic cough   . Chronic facial pain   . Chronic foot pain   . Complication of anesthesia    Sore jaw; could not chew or move mouth  . Convulsions/seizures ([place]) 10/16/2014  . Diabetes mellitus    type 2 niddm x 20 years  . Dyslipidemia   . Ejection fraction   . GERD (gastroesophageal reflux disease)   . Hammer toe    bilateral  . History of colonic polyps   . HTN (hypertension)   . Hx of radiation therapy 03/07/13- 03/29/13   right breast 4250 cGy 17 sessions  . Hyperlipidemia   . Melanoma ([place])   . Metatarsal bone fracture 2014  . Obesity   . Osteoarthritis   . Palpitations   . Seasonal allergies   . Skin cancer   . Vitamin B12 deficiency     Past Surgical History:  Procedure Laterality  Date  . ABDOMINAL HYSTERECTOMY    . BREAST BIOPSY Right 01/24/2013   Procedure: RE-EXCICION OF BREAST CANCER, ANTERIOR MARGINS;  Surgeon: Edward Jolly, MD;  Location: [facility];  Service: General;  Laterality: Right;  . BREAST LUMPECTOMY WITH NEEDLE LOCALIZATION Right 01/17/2013   Procedure: BREAST LUMPECTOMY WITH NEEDLE LOCALIZATION;  Surgeon: Edward Jolly, MD;  Location: [facility];  Service: General;  Laterality: Right;  . BUNIONECTOMY    . CATARACT EXTRACTION W/ INTRAOCULAR LENS IMPLANT Right   . CHOLECYSTECTOMY    . COLONOSCOPY    . CRANIOTOMY Right 10/18/2013   Procedure: CRANIOTOMY TUMOR EXCISION;  Surgeon: Floyce Stakes, MD;  Location: [facility];  Service: Neurosurgery;  Laterality: Right;  . EYE SURGERY    . HERNIA REPAIR    . KNEE ARTHROSCOPY Bilateral   . POLYPECTOMY     small adenomatous  . ULNAR TUNNEL RELEASE      Social History   Social History  . Marital status: Widowed    Spouse name: N/A  . Number of children: 0  . Years of education: college   Occupational History  . retired    Social History Main Topics  . Smoking status: Never Smoker  . Smokeless tobacco: Never Used  . Alcohol  use No  . Drug use: No  . Sexual activity: Yes     Comment: menarche age 66, hysterectomy age 57, HRT x 2-3 mos, G1- miscarriage   Other Topics Concern  . Not on file   Social History Narrative   Widowed, lives alone.  Ambulates independently.    Drinks decaf only   Patient is right handed.    Current Outpatient Prescriptions on File Prior to Visit  Medication Sig Dispense Refill  . acetaminophen (TYLENOL) 500 MG tablet Take 1,000 mg by mouth every 6 (six) hours as needed for pain.    Marland Kitchen ALPRAZolam (XANAX) 0.25 MG tablet Take 1 tablet (0.25 mg total) by mouth at bedtime. 30 tablet 1  . Blood Glucose Monitoring Suppl (PRODIGY VOICE BLOOD GLUCOSE) w/Device KIT Use to check blood sugar 1 time per day. 1 each 2  . calcium carbonate (OS-CAL) 600 MG TABS Take 600 mg by  mouth daily. Reported on 09/03/2015    . cholecalciferol (VITAMIN D) 1000 units tablet Take 600 Units by mouth daily.     . colestipol (COLESTID) 1 G tablet Take 1-2 g by mouth 3 (three) times daily. Take 2 tablets in the morning, 1 tablet at noon, and 2 tablets in the evening    . cyanocobalamin (,VITAMIN B-12,) 1000 MCG/ML injection INJECT 1 ML INTRAMUSCULARLY EVERY 21 DAYS 3 mL 2  . fexofenadine (ALLEGRA) 60 MG tablet Take 60 mg by mouth daily as needed for allergies.     . furosemide (LASIX) 20 MG tablet Take 1 tablet (20 mg total) by mouth daily. Do not take until I call you about labs. (Patient taking differently: Take 20 mg by mouth daily as needed for fluid or edema. Do not take until I call you about labs.) 30 tablet 0  . glucose blood test strip Use to check blood sugar 1 time per day. 100 each 3  . levothyroxine (SYNTHROID, LEVOTHROID) 50 MCG tablet Take 1 tablet (50 mcg total) by mouth daily before breakfast. 90 tablet 1  . lidocaine (LIDODERM) 5 % Place 1 patch onto the skin daily. Remove & Discard patch within 12 hours or as directed by MD (Patient taking differently: Place 1 patch onto the skin daily as needed (pain). Remove & Discard patch within 12 hours or as directed by MD) 90 patch 1  . OVER THE COUNTER MEDICATION Take 1 capsule by mouth daily. Recall Max memory supplement    . phenytoin (DILANTIN) 100 MG ER capsule take 2 capsules by mouth at bedtime 180 capsule 1  . PHENYTOIN INFATABS 50 MG tablet CHEW 1 TABLET DAILY 90 tablet 0  . Polyethyl Glycol-Propyl Glycol 0.4-0.3 % SOLN Place 2 drops into both eyes at bedtime.     . ranitidine (ZANTAC) 150 MG capsule Take 150 mg by mouth daily as needed for heartburn.     Marland Kitchen TARKA 2-180 MG per tablet TAKE 1 TABLET DAILY 90 tablet 1  . traMADol (ULTRAM) 50 MG tablet Take by mouth every 6 (six) hours as needed.    Marland Kitchen escitalopram (LEXAPRO) 5 MG tablet Take 1 tablet by mouth daily. Reported on 09/03/2015     No current facility-administered  medications on file prior to visit.     Allergies  Allergen Reactions  . Bystolic [Nebivolol Hcl] Other (See Comments)    "extreme weakness, heaviness in legs & arms, swelling in legs/arms/face, swollen abdomen, pain in bladder, feet pain, soreness in chest"  . Cholestyramine     "itching rash on  stomach, bloated, nausea, vomiting, sleeplessness, extreme pain in arms"  . Hydrazine Yellow [Tartrazine] Other (See Comments)    "does not reduce high blood pressure, pain in arm, high pressure, felt like I was on verge of heart attack, really weak"  . Morphine Other (See Comments)    Feels morbid, weak, still in pain  . Niacin Palpitations    Fast heart beat  . Niaspan [Niacin Er] Palpitations and Other (See Comments)    "fast heart beat, high blood pressure"  . Norvasc [Amlodipine Besylate] Other (See Comments)    "extreme fluid retention/pain)  . Optivar [Azelastine Hcl] Photosensitivity  . Sular [Nisoldipine Er] Other (See Comments)    "severe headaches, swelling eyes, hands, feet, shortness of breath, weak, flushed face, brain boiling, fluid retention, high blood sugar, nervous, heart fast beating"  . Tegretol [Carbamazepine] Other (See Comments)    Blood poisoning   . Telmisartan Other (See Comments)    "headache, difficulty urinating, high blood sugar, fluid retention"  . Cefdinir Swelling    Vaginal irritation, breathing,   . Clonidine Other (See Comments)    Dry mouth, fluid retention  . Clonidine Hydrochloride Other (See Comments)    Dry mouth, fluid retention  . Codeine Nausea And Vomiting  . Ezetimibe Other (See Comments)    Made weak  . Naproxen Other (See Comments)    Shrinks bladder  . Ziac [Bisoprolol-Hydrochlorothiazide] Other (See Comments)    "stopped urination"  . Elavil [Amitriptyline] Other (See Comments)    Gave Pt nightmares  . Keppra [Levetiracetam] Other (See Comments)    Shaking  . Lamictal [Lamotrigine]     itching  . Lyrica [Pregabalin] Swelling  .  Topamax [Topiramate]     Dry eyes  . Ace Inhibitors Other (See Comments)    unknown  . Actonel [Risedronate Sodium] Other (See Comments)    unknown  . Amlodipine Besylate Other (See Comments)    unknown  . Aspirin Other (See Comments)    unknown  . Atacand [Candesartan] Other (See Comments)    unknown  . Bextra [Valdecoxib] Other (See Comments)    unknown  . Bisoprolol-Hydrochlorothiazide Other (See Comments)    unknown  . Candesartan Cilexetil Other (See Comments)    unknown  . Cefadroxil Other (See Comments)    unknown  . Celecoxib Rash  . Hydrocodone Other (See Comments)    unknown  . Hydrocodone-Acetaminophen Other (See Comments)    unknown  . Iodinated Diagnostic Agents Rash    "All over"  . Meloxicam Other (See Comments)    unknown  . Methylprednisolone Sodium Succinate Other (See Comments)    unknown  . Nabumetone Other (See Comments)    unknown  . Penicillins Other (See Comments)    unknown  . Pseudoephedrine-Guaifenesin Other (See Comments)    unknown  . Risedronate Sodium Other (See Comments)    unknown  . Rofecoxib Other (See Comments)    unknown  . Ru-Tuss [Chlorphen-Pse-Atrop-Hyos-Scop] Other (See Comments)    unknown  . Sulfonamide Derivatives Other (See Comments)    unknown  . Sulphur [Sulfur] Other (See Comments)    unknown  . Telithromycin Other (See Comments)    unknown  . Terfenadine Other (See Comments)    unknown  . Trandolapril-Verapamil Hcl Er Other (See Comments)    Headache, difficulty urinating, high blood sugar, fluid retention  Pt is taking Tarka (trandolapril-verapamil) currently, but requests the medication stay in her allergy list  . Valium [Diazepam] Other (See Comments)  Makes her mean and hyper    Family History  Problem Relation Age of Onset  . Heart disease Mother   . Diabetes Father   . Pancreatic cancer Father   . Colon cancer    . Bone cancer Sister   . Prostate cancer Brother   . Colon cancer Brother   .  Rectal cancer Sister   . Thyroid disease Sister     benign goiter resected    Ht '5\' 8"'  (1.727 m)   Wt 135 lb (61.2 kg)   BMI 20.53 kg/m    Review of Systems She denies hypoglycemia.     Objective:   Physical Exam VITAL SIGNS:  See vs page GENERAL: no distress Pulses: dorsalis pedis intact bilat.   MSK: no deformity of the feet, except for bunions and hammer toes.   CV: no leg edema Skin:  no ulcer on the feet.  normal color and temp on the feet. Neuro: sensation is intact to touch on the feet, but decreased from normal.    A1c=7.3%    Assessment & Plan:  Type 2 DM: well-controlled Edema: this limits oral rx options Low-back pain: steroid injection is affecting glycemic control.

## 2016-02-11 NOTE — Telephone Encounter (Signed)
please call Dr Ival Bible office: I received a request from neurosurgery: Do you treat or want to treat the osteoporosis?  If not, we are happy to address this.

## 2016-02-11 NOTE — Patient Instructions (Addendum)
check your blood sugar once a day.  vary the time of day when you check, between before the 3 meals, and at bedtime.  also check if you have symptoms of your blood sugar being too high or too low.  please keep a record of the readings and bring it to your next appointment here (or you can bring the meter itself).  You can write it on any piece of paper.  please call us sooner if your blood sugar goes below 70, or if you have a lot of readings over 200.   Please stop taking the glyburide, and: Reduce the metformin to 1 pill each morning, and:  Increase the prandin to 2 mg 3 times a day (just before each meal).  I have sent a prescription to your pharmacy.   The effect of the steroid shot is gone in approx 1 week.  Call if the blood sugar stays over 200, so we can add a temporary medication.    Please come back for a follow-up appointment in 4 months.

## 2016-02-12 ENCOUNTER — Telehealth: Payer: Self-pay | Admitting: Endocrinology

## 2016-02-12 ENCOUNTER — Other Ambulatory Visit: Payer: Self-pay | Admitting: Anesthesiology

## 2016-02-12 DIAGNOSIS — M8580 Other specified disorders of bone density and structure, unspecified site: Secondary | ICD-10-CM

## 2016-02-12 NOTE — Telephone Encounter (Signed)
Dr Ernie Hew office called stated he will not be treating patient for Vitamin D. [phone]

## 2016-02-12 NOTE — Telephone Encounter (Signed)
please call patient: We have been asked to address the osteoporosis.  Please have a bone density test, then come back here approx 1 week later.  I hope this is ok.  Please let us know if not.

## 2016-02-12 NOTE — Telephone Encounter (Signed)
I contacted Dr. Bea Graff nurse and left a voicemail advising of message. Requested a call back on how they are going to proceed.

## 2016-02-12 NOTE — Telephone Encounter (Signed)
See message to be advised. Thanks!  

## 2016-02-13 ENCOUNTER — Other Ambulatory Visit: Payer: Self-pay | Admitting: Family Medicine

## 2016-02-13 DIAGNOSIS — R921 Mammographic calcification found on diagnostic imaging of breast: Secondary | ICD-10-CM

## 2016-02-13 NOTE — Telephone Encounter (Signed)
I contacted the patient and discussed message below. Pateint advised me her Neuro Surgeon, Dr. Maryjean Ka has ordered and scheduled this test for the patient on 02/24/2016. Please advise, Thanks!

## 2016-02-13 NOTE — Telephone Encounter (Signed)
Please ask pt to f/u here a few days after the x-ray

## 2016-02-14 NOTE — Telephone Encounter (Signed)
Patient is returning your call.  

## 2016-02-14 NOTE — Telephone Encounter (Signed)
I contacted the patient and she agreed to scheduling her follow up for 03/02/2016 at 145 pm.

## 2016-02-14 NOTE — Telephone Encounter (Signed)
I contacted the patient and left a voicemail advising of patient to call back and schedule a follow up.

## 2016-02-17 ENCOUNTER — Ambulatory Visit (INDEPENDENT_AMBULATORY_CARE_PROVIDER_SITE_OTHER): Payer: Medicare Other | Admitting: Neurology

## 2016-02-17 ENCOUNTER — Encounter: Payer: Self-pay | Admitting: Neurology

## 2016-02-17 VITALS — BP 142/85 | HR 74 | Ht 68.0 in | Wt 137.0 lb

## 2016-02-17 DIAGNOSIS — R569 Unspecified convulsions: Secondary | ICD-10-CM

## 2016-02-17 DIAGNOSIS — Z5181 Encounter for therapeutic drug level monitoring: Secondary | ICD-10-CM | POA: Diagnosis not present

## 2016-02-17 MED ORDER — PHENYTOIN SODIUM EXTENDED 100 MG PO CAPS: ORAL_CAPSULE | ORAL | Status: DC

## 2016-02-17 NOTE — Progress Notes (Signed)
Reason for visit: Seizures  [name] is an 82 y.o. female  History of present illness:  [name] is an 82 year old right-handed white female with a history of seizures. The patient has not had any recurrence since last seen. The patient remains on Dilantin taking 150 mg in the morning and 200 mg in the evening. She does have some gait instability, she has not had any recent falls, but she will stumble on occasion. She feels nervous, she had an episode of total body numbness on 01/11/2016 that gradually resolved. The patient has quite an impressive list of medication intolerances. She returns this office for an evaluation. She is on vitamin D supplementation.  Past Medical History:  Diagnosis Date  . Anemia   . Anxiety   . Cancer of right breast ([place]) 12/26/12   right breast 12:00 o'clock, DCIS  . Chronic cough   . Chronic facial pain   . Chronic foot pain   . Complication of anesthesia    Sore jaw; could not chew or move mouth  . Convulsions/seizures ([place]) 10/16/2014  . Diabetes mellitus    type 2 niddm x 20 years  . Dyslipidemia   . Ejection fraction   . GERD (gastroesophageal reflux disease)   . Hammer toe    bilateral  . History of colonic polyps   . HTN (hypertension)   . Hx of radiation therapy 03/07/13- 03/29/13   right breast 4250 cGy 17 sessions  . Hyperlipidemia   . Melanoma ([place])   . Metatarsal bone fracture 2014  . Obesity   . Osteoarthritis   . Palpitations   . Seasonal allergies   . Skin cancer   . Vitamin B12 deficiency     Past Surgical History:  Procedure Laterality Date  . ABDOMINAL HYSTERECTOMY    . BREAST BIOPSY Right 01/24/2013   Procedure: RE-EXCICION OF BREAST CANCER, ANTERIOR MARGINS;  Surgeon: Edward Jolly, MD;  Location: [facility];  Service: General;  Laterality: Right;  . BREAST LUMPECTOMY WITH NEEDLE LOCALIZATION Right 01/17/2013   Procedure: BREAST LUMPECTOMY WITH NEEDLE LOCALIZATION;  Surgeon: Edward Jolly, MD;  Location: [facility];   Service: General;  Laterality: Right;  . BUNIONECTOMY    . CATARACT EXTRACTION W/ INTRAOCULAR LENS IMPLANT Right   . CHOLECYSTECTOMY    . COLONOSCOPY    . CRANIOTOMY Right 10/18/2013   Procedure: CRANIOTOMY TUMOR EXCISION;  Surgeon: Floyce Stakes, MD;  Location: [facility];  Service: Neurosurgery;  Laterality: Right;  . EYE SURGERY    . HERNIA REPAIR    . KNEE ARTHROSCOPY Bilateral   . POLYPECTOMY     small adenomatous  . ULNAR TUNNEL RELEASE      Family History  Problem Relation Age of Onset  . Heart disease Mother   . Diabetes Father   . Pancreatic cancer Father   . Colon cancer    . Bone cancer Sister   . Prostate cancer Brother   . Colon cancer Brother   . Rectal cancer Sister   . Thyroid disease Sister     benign goiter resected    Social history:  reports that she has never smoked. She has never used smokeless tobacco. She reports that she does not drink alcohol or use drugs.    Allergies  Allergen Reactions  . Bystolic [Nebivolol Hcl] Other (See Comments)    "extreme weakness, heaviness in legs & arms, swelling in legs/arms/face, swollen abdomen, pain in bladder, feet pain, soreness in chest"  .  Cholestyramine     "itching rash on stomach, bloated, nausea, vomiting, sleeplessness, extreme pain in arms"  . Hydrazine Yellow [Tartrazine] Other (See Comments)    "does not reduce high blood pressure, pain in arm, high pressure, felt like I was on verge of heart attack, really weak"  . Morphine Other (See Comments)    Feels morbid, weak, still in pain  . Niacin Palpitations    Fast heart beat  . Niaspan [Niacin Er] Palpitations and Other (See Comments)    "fast heart beat, high blood pressure"  . Norvasc [Amlodipine Besylate] Other (See Comments)    "extreme fluid retention/pain)  . Optivar [Azelastine Hcl] Photosensitivity  . Sular [Nisoldipine Er] Other (See Comments)    "severe headaches, swelling eyes, hands, feet, shortness of breath, weak, flushed face,  brain boiling, fluid retention, high blood sugar, nervous, heart fast beating"  . Tegretol [Carbamazepine] Other (See Comments)    Blood poisoning   . Telmisartan Other (See Comments)    "headache, difficulty urinating, high blood sugar, fluid retention"  . Cefdinir Swelling    Vaginal irritation, breathing,   . Clonidine Other (See Comments)    Dry mouth, fluid retention  . Clonidine Hydrochloride Other (See Comments)    Dry mouth, fluid retention  . Codeine Nausea And Vomiting  . Ezetimibe Other (See Comments)    Made weak  . Naproxen Other (See Comments)    Shrinks bladder  . Ziac [Bisoprolol-Hydrochlorothiazide] Other (See Comments)    "stopped urination"  . Elavil [Amitriptyline] Other (See Comments)    Gave Pt nightmares  . Keppra [Levetiracetam] Other (See Comments)    Shaking  . Lamictal [Lamotrigine]     itching  . Lyrica [Pregabalin] Swelling  . Topamax [Topiramate]     Dry eyes  . Ace Inhibitors Other (See Comments)    unknown  . Actonel [Risedronate Sodium] Other (See Comments)    unknown  . Amlodipine Besylate Other (See Comments)    unknown  . Aspirin Other (See Comments)    unknown  . Atacand [Candesartan] Other (See Comments)    unknown  . Bextra [Valdecoxib] Other (See Comments)    unknown  . Bisoprolol-Hydrochlorothiazide Other (See Comments)    unknown  . Candesartan Cilexetil Other (See Comments)    unknown  . Cefadroxil Other (See Comments)    unknown  . Celecoxib Rash  . Hydrocodone Other (See Comments)    unknown  . Hydrocodone-Acetaminophen Other (See Comments)    unknown  . Iodinated Diagnostic Agents Rash    "All over"  . Meloxicam Other (See Comments)    unknown  . Methylprednisolone Sodium Succinate Other (See Comments)    unknown  . Nabumetone Other (See Comments)    unknown  . Penicillins Other (See Comments)    unknown  . Pseudoephedrine-Guaifenesin Other (See Comments)    unknown  . Risedronate Sodium Other (See Comments)      unknown  . Rofecoxib Other (See Comments)    unknown  . Ru-Tuss [Chlorphen-Pse-Atrop-Hyos-Scop] Other (See Comments)    unknown  . Sulfonamide Derivatives Other (See Comments)    unknown  . Sulphur [Sulfur] Other (See Comments)    unknown  . Telithromycin Other (See Comments)    unknown  . Terfenadine Other (See Comments)    unknown  . Trandolapril-Verapamil Hcl Er Other (See Comments)    Headache, difficulty urinating, high blood sugar, fluid retention  Pt is taking Tarka (trandolapril-verapamil) currently, but requests the medication stay in her allergy list  .  Valium [Diazepam] Other (See Comments)    Makes her mean and hyper    Medications:  Prior to Admission medications   Medication Sig Start Date End Date Taking? Authorizing Provider  acetaminophen (TYLENOL) 500 MG tablet Take 1,000 mg by mouth every 6 (six) hours as needed for pain.   Yes Historical Provider, MD  ALPRAZolam (XANAX) 0.25 MG tablet Take 1 tablet (0.25 mg total) by mouth at bedtime. 03/05/15  Yes Hoyt Koch, MD  Blood Glucose Monitoring Suppl (PRODIGY VOICE BLOOD GLUCOSE) w/Device KIT Use to check blood sugar 1 time per day. 10/18/15  Yes Renato Shin, MD  calcium carbonate (OS-CAL) 600 MG TABS Take 600 mg by mouth daily. Reported on 09/03/2015   Yes Historical Provider, MD  cholecalciferol (VITAMIN D) 1000 units tablet Take 600 Units by mouth daily.    Yes Historical Provider, MD  colestipol (COLESTID) 1 G tablet Take 1-2 g by mouth 3 (three) times daily. Take 2 tablets in the morning, 1 tablet at noon, and 2 tablets in the evening   Yes Historical Provider, MD  cyanocobalamin (,VITAMIN B-12,) 1000 MCG/ML injection INJECT 1 ML INTRAMUSCULARLY EVERY 21 DAYS 10/07/15  Yes Hoyt Koch, MD  fexofenadine (ALLEGRA) 60 MG tablet Take 60 mg by mouth daily as needed for allergies.    Yes Historical Provider, MD  furosemide (LASIX) 20 MG tablet Take 1 tablet (20 mg total) by mouth daily. Do not take until  I call you about labs. Patient taking differently: Take 20 mg by mouth daily as needed for fluid or edema. Do not take until I call you about labs. 12/27/13  Yes Marin Olp, MD  glucose blood test strip Use to check blood sugar 1 time per day. 10/22/15  Yes Renato Shin, MD  levothyroxine (SYNTHROID, LEVOTHROID) 50 MCG tablet Take 1 tablet (50 mcg total) by mouth daily before breakfast. 12/17/15  Yes Renato Shin, MD  lidocaine (LIDODERM) 5 % Place 1 patch onto the skin daily. Remove & Discard patch within 12 hours or as directed by MD Patient taking differently: Place 1 patch onto the skin daily as needed (pain). Remove & Discard patch within 12 hours or as directed by MD 01/25/15  Yes Hoyt Koch, MD  metFORMIN (GLUCOPHAGE-XR) 500 MG 24 hr tablet Take 1 tablet (500 mg total) by mouth daily with breakfast. 02/11/16  Yes Renato Shin, MD  OVER THE COUNTER MEDICATION Take 1 capsule by mouth daily. Recall Max memory supplement   Yes Historical Provider, MD  phenytoin (DILANTIN) 100 MG ER capsule take 2 capsules by mouth at bedtime 01/15/16  Yes Kathrynn Ducking, MD  PHENYTOIN INFATABS 50 MG tablet CHEW 1 TABLET DAILY 01/10/16  Yes Kathrynn Ducking, MD  Polyethyl Glycol-Propyl Glycol 0.4-0.3 % SOLN Place 2 drops into both eyes at bedtime.    Yes Historical Provider, MD  PRANDIN 2 MG tablet Take 1 tablet (2 mg total) by mouth 3 (three) times daily before meals. 02/11/16  Yes Renato Shin, MD  ranitidine (ZANTAC) 150 MG capsule Take 150 mg by mouth daily as needed for heartburn.    Yes Historical Provider, MD  TARKA 2-180 MG per tablet TAKE 1 TABLET DAILY 02/06/14  Yes Carlena Bjornstad, MD  traMADol (ULTRAM) 50 MG tablet Take by mouth every 6 (six) hours as needed.   Yes Historical Provider, MD  escitalopram (LEXAPRO) 5 MG tablet Take 1 tablet by mouth daily. Reported on 09/03/2015 07/15/15   Historical Provider, MD  ROS:  Out of a complete 14 system review of symptoms, the patient complains only  of the following symptoms, and all other reviewed systems are negative.  Runny nose Eye itching, double vision Cough Excessive eating Diarrhea  Blood pressure (!) 142/85, pulse 74, height '5\' 8"'  (1.727 m), weight 137 lb (62.1 kg).  Physical Exam  General: The patient is alert and cooperative at the time of the examination.  Skin: No significant peripheral edema is noted.   Neurologic Exam  Mental status: The patient is alert and oriented x 3 at the time of the examination. The patient has apparent normal recent and remote memory, with an apparently normal attention span and concentration ability.   Cranial nerves: Facial symmetry is present. Speech is normal, no aphasia or dysarthria is noted. Extraocular movements are full. Visual fields are full.  Motor: The patient has good strength in all 4 extremities.  Sensory examination: Soft touch sensation is symmetric on the face, arms, and legs.  Coordination: The patient has good finger-nose-finger and heel-to-shin bilaterally.  Gait and station: The patient has a normal gait. Tandem gait is slightly unsteady. Romberg is negative. No drift is seen.  Reflexes: Deep tendon reflexes are symmetric.   Assessment/Plan:  1. History of seizures  The patient has done well with the seizures, she is having some mild gait instability, we will need to check blood work today to exclude mild Dilantin toxicity. She will follow-up in 6 months. The patient does operate a motor vehicle short distances.  Jill Alexanders MD 02/17/2016 11:08 AM  [facility] [address] [place], [place] [address]  Phone [phone] Fax [phone]

## 2016-02-18 LAB — CBC WITH DIFFERENTIAL/PLATELET
Basophils Absolute: 0 10*3/uL (ref 0.0–0.2)
Basos: 0 %
EOS (ABSOLUTE): 0.1 10*3/uL (ref 0.0–0.4)
Eos: 1 %
Hematocrit: 35.1 % (ref 34.0–46.6)
Hemoglobin: 11.5 g/dL (ref 11.1–15.9)
Immature Grans (Abs): 0 10*3/uL (ref 0.0–0.1)
Immature Granulocytes: 0 %
Lymphocytes Absolute: 1.9 10*3/uL (ref 0.7–3.1)
Lymphs: 25 %
MCH: 31.2 pg (ref 26.6–33.0)
MCHC: 32.8 g/dL (ref 31.5–35.7)
MCV: 95 fL (ref 79–97)
Monocytes Absolute: 0.5 10*3/uL (ref 0.1–0.9)
Monocytes: 6 %
Neutrophils Absolute: 5.2 10*3/uL (ref 1.4–7.0)
Neutrophils: 68 %
Platelets: 294 10*3/uL (ref 150–379)
RBC: 3.69 x10E6/uL — ABNORMAL LOW (ref 3.77–5.28)
RDW: 14.5 % (ref 12.3–15.4)
WBC: 7.7 10*3/uL (ref 3.4–10.8)

## 2016-02-18 LAB — COMPREHENSIVE METABOLIC PANEL
ALT: 30 IU/L (ref 0–32)
AST: 21 IU/L (ref 0–40)
Albumin/Globulin Ratio: 2 (ref 1.2–2.2)
Albumin: 4 g/dL (ref 3.5–4.7)
Alkaline Phosphatase: 97 IU/L (ref 39–117)
BUN/Creatinine Ratio: 18 (ref 12–28)
BUN: 13 mg/dL (ref 8–27)
Bilirubin Total: <0.2 mg/dL (ref 0.0–1.2)
CO2: 23 mmol/L (ref 18–29)
Calcium: 9.1 mg/dL (ref 8.7–10.3)
Chloride: 96 mmol/L (ref 96–106)
Creatinine, Ser: 0.74 mg/dL (ref 0.57–1.00)
GFR calc Af Amer: 87 mL/min/{1.73_m2} (ref >59)
GFR calc non Af Amer: 75 mL/min/{1.73_m2} (ref >59)
Globulin, Total: 2 g/dL (ref 1.5–4.5)
Glucose: 262 mg/dL — ABNORMAL HIGH (ref 65–99)
Potassium: 4.8 mmol/L (ref 3.5–5.2)
Sodium: 136 mmol/L (ref 134–144)
Total Protein: 6 g/dL (ref 6.0–8.5)

## 2016-02-18 LAB — PHENYTOIN LEVEL, TOTAL: Phenytoin (Dilantin), Serum: 5.5 ug/mL — ABNORMAL LOW (ref 10.0–20.0)

## 2016-02-19 ENCOUNTER — Telehealth: Payer: Self-pay | Admitting: *Deleted

## 2016-02-19 NOTE — Telephone Encounter (Signed)
Called and spoke to patient about lab results per Dr Jannifer Franklin note. Pt verbalized understanding.

## 2016-02-19 NOTE — Telephone Encounter (Signed)
-----   Message from Kathrynn Ducking, MD sent at 02/18/2016  5:40 PM EDT ----- Blood work shows high random glucose. Otherwise chemistry panel and LFT's and CBC are OK. Dilantin level is low, but the patient is not having any seizures, no dose adjustment. Please call the patient. ----- Message ----- From: Lavone Neri Lab Results In Sent: 02/18/2016   5:40 AM To: Kathrynn Ducking, MD

## 2016-02-24 ENCOUNTER — Ambulatory Visit: Payer: Medicare Other | Admitting: Neurology

## 2016-02-25 ENCOUNTER — Ambulatory Visit
Admission: RE | Admit: 2016-02-25 | Discharge: 2016-02-25 | Disposition: A | Discharge status: Home / Self Care | Payer: Medicare Other | Source: Ambulatory Visit | Attending: Anesthesiology | Admitting: Anesthesiology

## 2016-02-25 DIAGNOSIS — M8580 Other specified disorders of bone density and structure, unspecified site: Secondary | ICD-10-CM

## 2016-02-25 DIAGNOSIS — Z780 Asymptomatic menopausal state: Secondary | ICD-10-CM | POA: Diagnosis not present

## 2016-02-25 DIAGNOSIS — M810 Age-related osteoporosis without current pathological fracture: Secondary | ICD-10-CM | POA: Diagnosis not present

## 2016-03-02 ENCOUNTER — Other Ambulatory Visit (INDEPENDENT_AMBULATORY_CARE_PROVIDER_SITE_OTHER): Payer: Medicare Other

## 2016-03-02 ENCOUNTER — Encounter: Payer: Self-pay | Admitting: Endocrinology

## 2016-03-02 ENCOUNTER — Ambulatory Visit (INDEPENDENT_AMBULATORY_CARE_PROVIDER_SITE_OTHER): Payer: Medicare Other | Admitting: Endocrinology

## 2016-03-02 VITALS — BP 134/64 | HR 94 | Ht 68.0 in | Wt 137.0 lb

## 2016-03-02 DIAGNOSIS — E1151 Type 2 diabetes mellitus with diabetic peripheral angiopathy without gangrene: Secondary | ICD-10-CM | POA: Diagnosis not present

## 2016-03-02 DIAGNOSIS — M810 Age-related osteoporosis without current pathological fracture: Secondary | ICD-10-CM

## 2016-03-02 LAB — TSH: TSH: 1.6 u[IU]/mL (ref 0.35–4.50)

## 2016-03-02 LAB — VITAMIN D 25 HYDROXY (VIT D DEFICIENCY, FRACTURES): VITD: 25.88 ng/mL — ABNORMAL LOW (ref 30.00–100.00)

## 2016-03-02 MED ORDER — BROMOCRIPTINE MESYLATE 0.8 MG PO TABS: 0.8000 mg | ORAL_TABLET | Freq: Every day | ORAL | 11 refills | Status: DC

## 2016-03-02 NOTE — Patient Instructions (Addendum)
check your blood sugar once a day.  vary the time of day when you check, between before the 3 meals, and at bedtime.  also check if you have symptoms of your blood sugar being too high or too low.  please keep a record of the readings and bring it to your next appointment here (or you can bring the meter itself).  You can write it on any piece of paper.  please call us sooner if your blood sugar goes below 70, or if you have a lot of readings over 200.   I have sent a prescription to your pharmacy, to add "bromocriptine."   Take calcium 1200 mg per day, and vitamin-D, 400 units per day It is critically important to prevent falling down (keep floor areas well-lit, dry, and free of loose objects.  If you have a cane, walker, or wheelchair, you should use it, even for short trips around the house.  Wear flat-soled shoes.  Also, try not to rush).   blood tests are requested for you today.  We'll let you know about the results. Based on the results, you should consider a once a year infusion called "reclast."  Please come back for a follow-up appointment in 3 months.

## 2016-03-02 NOTE — Progress Notes (Signed)
 Subjective:    Patient ID: [name], female    DOB: 11/17/1929, 82 y.o.   MRN: [id number]  HPI Pt was noted to have osteoporosis in 2017.  She has never been on medication for this.  she has had these bony fractures: left foot, T-spine, right wrist, and right humerus.  These happens with seizures or other falls.  She has no history of any of the following: multiple myeloma, renal dz, prolonged bedrest, alcoholism, smoking, liver dz, vid-d deficiency, or primary hyperparathyroidism.  She does not take heparin.  She had TSH at age 42.  She has moderate pain at the mid-back, but no recent assoc falls.    Pt also returns for f/u of diabetes mellitus:  DM type: 2 (but lean body habitus suggests she is developing type 1).   Dx'ed: 1995 Complications: polyneuropathy and PAD.   Therapy: 3 oral meds.  DKA: never Severe hypoglycemia: never.   Pancreatitis: never.   Other: she took insulin for only a brief time after dx; she declines januvia; pt says she wants brand-name starlix, as the tab resembles another med; she declines prandin; she can tolerate metformin-XR only 1000/d--she gets diarrhea if she takes any more; she had to d/c pioglitizone, due to edema; she declnes januvia, due to fear of cancer.  Interval history:  No further steroids.  she brings a record of her cbg's which i have reviewed today.  It varies from 196-323.  There is no trend throughout the day.    Past Medical History:  Diagnosis Date  . Anemia   . Anxiety   . Cancer of right breast (HCC) 12/26/12   right breast 12:00 o'clock, DCIS  . Chronic cough   . Chronic facial pain   . Chronic foot pain   . Complication of anesthesia    Sore jaw; could not chew or move mouth  . Convulsions/seizures (HCC) 10/16/2014  . Diabetes mellitus    type 2 niddm x 20 years  . Dyslipidemia   . Ejection fraction   . GERD (gastroesophageal reflux disease)   . Hammer toe    bilateral  . History of colonic polyps   . HTN (hypertension)   .  Hx of radiation therapy 03/07/13- 03/29/13   right breast 4250 cGy 17 sessions  . Hyperlipidemia   . Melanoma (HCC)   . Metatarsal bone fracture 2014  . Obesity   . Osteoarthritis   . Palpitations   . Seasonal allergies   . Skin cancer   . Vitamin B12 deficiency     Past Surgical History:  Procedure Laterality Date  . ABDOMINAL HYSTERECTOMY    . BREAST BIOPSY Right 01/24/2013   Procedure: RE-EXCICION OF BREAST CANCER, ANTERIOR MARGINS;  Surgeon: Benjamin T Hoxworth, MD;  Location: [facility];  Service: General;  Laterality: Right;  . BREAST LUMPECTOMY WITH NEEDLE LOCALIZATION Right 01/17/2013   Procedure: BREAST LUMPECTOMY WITH NEEDLE LOCALIZATION;  Surgeon: Benjamin T Hoxworth, MD;  Location: [facility];  Service: General;  Laterality: Right;  . BUNIONECTOMY    . CATARACT EXTRACTION W/ INTRAOCULAR LENS IMPLANT Right   . CHOLECYSTECTOMY    . COLONOSCOPY    . CRANIOTOMY Right 10/18/2013   Procedure: CRANIOTOMY TUMOR EXCISION;  Surgeon: Ernesto M Botero, MD;  Location: [facility];  Service: Neurosurgery;  Laterality: Right;  . EYE SURGERY    . HERNIA REPAIR    . KNEE ARTHROSCOPY Bilateral   . POLYPECTOMY     small adenomatous  . ULNAR TUNNEL   RELEASE      Social History   Social History  . Marital status: Widowed    Spouse name: N/A  . Number of children: 0  . Years of education: college   Occupational History  . retired    Social History Main Topics  . Smoking status: Never Smoker  . Smokeless tobacco: Never Used  . Alcohol use No  . Drug use: No  . Sexual activity: Yes     Comment: menarche age 31, hysterectomy age 72, HRT x 2-3 mos, G1- miscarriage   Other Topics Concern  . Not on file   Social History Narrative   Widowed, lives alone.  Ambulates independently.    Drinks decaf only   Patient is right handed.    Current Outpatient Prescriptions on File Prior to Visit  Medication Sig Dispense Refill  . acetaminophen (TYLENOL) 500 MG tablet Take 1,000 mg by mouth  every 6 (six) hours as needed for pain.    Marland Kitchen ALPRAZolam (XANAX) 0.25 MG tablet Take 1 tablet (0.25 mg total) by mouth at bedtime. 30 tablet 1  . Blood Glucose Monitoring Suppl (PRODIGY VOICE BLOOD GLUCOSE) w/Device KIT Use to check blood sugar 1 time per day. 1 each 2  . calcium carbonate (OS-CAL) 600 MG TABS Take 600 mg by mouth daily. Reported on 09/03/2015    . cholecalciferol (VITAMIN D) 1000 units tablet Take 600 Units by mouth daily.     . colestipol (COLESTID) 1 G tablet Take 1-2 g by mouth 3 (three) times daily. Take 2 tablets in the morning, 1 tablet at noon, and 2 tablets in the evening    . cyanocobalamin (,VITAMIN B-12,) 1000 MCG/ML injection INJECT 1 ML INTRAMUSCULARLY EVERY 21 DAYS 3 mL 2  . escitalopram (LEXAPRO) 5 MG tablet Take 1 tablet by mouth daily. Reported on 09/03/2015    . fexofenadine (ALLEGRA) 60 MG tablet Take 60 mg by mouth daily as needed for allergies.     . furosemide (LASIX) 20 MG tablet Take 1 tablet (20 mg total) by mouth daily. Do not take until I call you about labs. (Patient taking differently: Take 20 mg by mouth daily as needed for fluid or edema. Do not take until I call you about labs.) 30 tablet 0  . glucose blood test strip Use to check blood sugar 1 time per day. 100 each 3  . levothyroxine (SYNTHROID, LEVOTHROID) 50 MCG tablet Take 1 tablet (50 mcg total) by mouth daily before breakfast. 90 tablet 1  . lidocaine (LIDODERM) 5 % Place 1 patch onto the skin daily. Remove & Discard patch within 12 hours or as directed by MD (Patient taking differently: Place 1 patch onto the skin daily as needed (pain). Remove & Discard patch within 12 hours or as directed by MD) 90 patch 1  . metFORMIN (GLUCOPHAGE-XR) 500 MG 24 hr tablet Take 1 tablet (500 mg total) by mouth daily with breakfast. 90 tablet 3  . OVER THE COUNTER MEDICATION Take 1 capsule by mouth daily. Recall Max memory supplement    . phenytoin (DILANTIN) 100 MG ER capsule One capsule in the morning and two at  night    . PHENYTOIN INFATABS 50 MG tablet CHEW 1 TABLET DAILY 90 tablet 0  . Polyethyl Glycol-Propyl Glycol 0.4-0.3 % SOLN Place 2 drops into both eyes at bedtime.     Marland Kitchen PRANDIN 2 MG tablet Take 1 tablet (2 mg total) by mouth 3 (three) times daily before meals. 270 tablet 11  .  ranitidine (ZANTAC) 150 MG capsule Take 150 mg by mouth daily as needed for heartburn.     Marland Kitchen TARKA 2-180 MG per tablet TAKE 1 TABLET DAILY 90 tablet 1  . traMADol (ULTRAM) 50 MG tablet Take by mouth every 6 (six) hours as needed.     No current facility-administered medications on file prior to visit.     Allergies  Allergen Reactions  . Bystolic [Nebivolol Hcl] Other (See Comments)    "extreme weakness, heaviness in legs & arms, swelling in legs/arms/face, swollen abdomen, pain in bladder, feet pain, soreness in chest"  . Cholestyramine     "itching rash on stomach, bloated, nausea, vomiting, sleeplessness, extreme pain in arms"  . Hydrazine Yellow [Tartrazine] Other (See Comments)    "does not reduce high blood pressure, pain in arm, high pressure, felt like I was on verge of heart attack, really weak"  . Morphine Other (See Comments)    Feels morbid, weak, still in pain  . Niacin Palpitations    Fast heart beat  . Niaspan [Niacin Er] Palpitations and Other (See Comments)    "fast heart beat, high blood pressure"  . Norvasc [Amlodipine Besylate] Other (See Comments)    "extreme fluid retention/pain)  . Optivar [Azelastine Hcl] Photosensitivity  . Sular [Nisoldipine Er] Other (See Comments)    "severe headaches, swelling eyes, hands, feet, shortness of breath, weak, flushed face, brain boiling, fluid retention, high blood sugar, nervous, heart fast beating"  . Tegretol [Carbamazepine] Other (See Comments)    Blood poisoning   . Telmisartan Other (See Comments)    "headache, difficulty urinating, high blood sugar, fluid retention"  . Cefdinir Swelling    Vaginal irritation, breathing,   . Clonidine Other  (See Comments)    Dry mouth, fluid retention  . Clonidine Hydrochloride Other (See Comments)    Dry mouth, fluid retention  . Codeine Nausea And Vomiting  . Ezetimibe Other (See Comments)    Made weak  . Naproxen Other (See Comments)    Shrinks bladder  . Ziac [Bisoprolol-Hydrochlorothiazide] Other (See Comments)    "stopped urination"  . Elavil [Amitriptyline] Other (See Comments)    Gave Pt nightmares  . Keppra [Levetiracetam] Other (See Comments)    Shaking  . Lamictal [Lamotrigine]     itching  . Lyrica [Pregabalin] Swelling  . Topamax [Topiramate]     Dry eyes  . Ace Inhibitors Other (See Comments)    unknown  . Actonel [Risedronate Sodium] Other (See Comments)    unknown  . Amlodipine Besylate Other (See Comments)    unknown  . Aspirin Other (See Comments)    unknown  . Atacand [Candesartan] Other (See Comments)    unknown  . Bextra [Valdecoxib] Other (See Comments)    unknown  . Bisoprolol-Hydrochlorothiazide Other (See Comments)    unknown  . Candesartan Cilexetil Other (See Comments)    unknown  . Cefadroxil Other (See Comments)    unknown  . Celecoxib Rash  . Hydrocodone Other (See Comments)    unknown  . Hydrocodone-Acetaminophen Other (See Comments)    unknown  . Iodinated Diagnostic Agents Rash    "All over"  . Meloxicam Other (See Comments)    unknown  . Methylprednisolone Sodium Succinate Other (See Comments)    unknown  . Nabumetone Other (See Comments)    unknown  . Penicillins Other (See Comments)    unknown  . Pseudoephedrine-Guaifenesin Other (See Comments)    unknown  . Risedronate Sodium Other (See Comments)  unknown  . Rofecoxib Other (See Comments)    unknown  . Ru-Tuss [Chlorphen-Pse-Atrop-Hyos-Scop] Other (See Comments)    unknown  . Sulfonamide Derivatives Other (See Comments)    unknown  . Sulphur [Sulfur] Other (See Comments)    unknown  . Telithromycin Other (See Comments)    unknown  . Terfenadine Other (See Comments)     unknown  . Trandolapril-Verapamil Hcl Er Other (See Comments)    Headache, difficulty urinating, high blood sugar, fluid retention  Pt is taking Tarka (trandolapril-verapamil) currently, but requests the medication stay in her allergy list  . Valium [Diazepam] Other (See Comments)    Makes her mean and hyper    Family History  Problem Relation Age of Onset  . Heart disease Mother   . Osteoporosis Mother   . Diabetes Father   . Pancreatic cancer Father   . Colon cancer    . Bone cancer Sister   . Prostate cancer Brother   . Colon cancer Brother   . Rectal cancer Sister   . Thyroid disease Sister     benign goiter resected    BP 134/64   Pulse 94   Ht 5' 8" (1.727 m)   Wt 137 lb (62.1 kg)   SpO2 98%   BMI 20.83 kg/m   Review of Systems denies weight loss, hematuria, cold intolerance, edema, skin rash, insomnia, cramps, memory loss, easy bruising, and rhinorrhea.  Heartburn is well-controlled.      Objective:   Physical Exam Vital signs: see vs page Gen: elderly, frail, no distress Spine: no kyphosis Gait: normal and steady   The BMD measured at Femur Total Left is 0.637 g/cm2 with a T-score of -2.9. This patient is considered osteoporotic according to World Health Organization (WHO) criteria. L-4 was excluded due to degenerative changes.  Site Region Measured Date Measured Age YA BMD Significant CHANGE T-score DualFemur Total Left 02/25/2016    83.9         -2.9    0.637 g/cm2  AP Spine  L1-L3      02/25/2016    [age]         -2.4    0.914 g/cm2  Lab Results  Component Value Date   CALCIUM 9.1 02/17/2016   CAION 1.17 11/17/2013   PHOS 3.7 05/03/2007   Lab Results  Component Value Date   HGBA1C 7.3 02/11/2016      Assessment & Plan:  Osteoporosis, new to me. Back pain: she is at risk for falls Type 2 DM, with PAD: she needs increased rx, if it can be done with a regimen that avoids or minimizes hypoglycemia.   Patient is advised the  following: Patient Instructions  check your blood sugar once a day.  vary the time of day when you check, between before the 3 meals, and at bedtime.  also check if you have symptoms of your blood sugar being too high or too low.  please keep a record of the readings and bring it to your next appointment here (or you can bring the meter itself).  You can write it on any piece of paper.  please call us sooner if your blood sugar goes below 70, or if you have a lot of readings over 200.   I have sent a prescription to your pharmacy, to add "bromocriptine."   Take calcium 1200 mg per day, and vitamin-D, 400 units per day It is critically important to prevent falling down (keep floor areas well-lit, dry, and   free of loose objects.  If you have a cane, walker, or wheelchair, you should use it, even for short trips around the house.  Wear flat-soled shoes.  Also, try not to rush).   blood tests are requested for you today.  We'll let you know about the results. Based on the results, you should consider a once a year infusion called "reclast."  Please come back for a follow-up appointment in 3 months.     

## 2016-03-03 LAB — PTH, INTACT AND CALCIUM
Calcium: 9 mg/dL (ref 8.6–10.4)
PTH: 61 pg/mL (ref 14–64)

## 2016-03-03 LAB — MICROALBUMIN / CREATININE URINE RATIO
Creatinine,U: 124.7 mg/dL
Microalb Creat Ratio: 0.6 mg/g (ref 0.0–30.0)
Microalb, Ur: <0.7 mg/dL (ref 0.0–1.9)

## 2016-03-04 LAB — PROTEIN ELECTROPHORESIS, SERUM
Albumin ELP: 3.9 g/dL (ref 3.8–4.8)
Alpha-1-Globulin: 0.3 g/dL (ref 0.2–0.3)
Alpha-2-Globulin: 0.6 g/dL (ref 0.5–0.9)
Beta 2: 0.2 g/dL (ref 0.2–0.5)
Beta Globulin: 0.4 g/dL (ref 0.4–0.6)
Gamma Globulin: 0.4 g/dL — ABNORMAL LOW (ref 0.8–1.7)
Total Protein, Serum Electrophoresis: 5.8 g/dL — ABNORMAL LOW (ref 6.1–8.1)

## 2016-03-09 DIAGNOSIS — I10 Essential (primary) hypertension: Secondary | ICD-10-CM | POA: Diagnosis not present

## 2016-03-09 DIAGNOSIS — S32001A Stable burst fracture of unspecified lumbar vertebra, initial encounter for closed fracture: Secondary | ICD-10-CM | POA: Diagnosis not present

## 2016-03-09 DIAGNOSIS — M546 Pain in thoracic spine: Secondary | ICD-10-CM | POA: Diagnosis not present

## 2016-03-13 ENCOUNTER — Encounter: Payer: Self-pay | Admitting: Endocrinology

## 2016-03-17 ENCOUNTER — Telehealth: Payer: Self-pay | Admitting: *Deleted

## 2016-03-17 ENCOUNTER — Ambulatory Visit (INDEPENDENT_AMBULATORY_CARE_PROVIDER_SITE_OTHER): Payer: Medicare Other | Admitting: Podiatry

## 2016-03-17 ENCOUNTER — Encounter: Payer: Self-pay | Admitting: Podiatry

## 2016-03-17 VITALS — BP 160/94 | HR 82 | Temp 97.7°F | Resp 14

## 2016-03-17 DIAGNOSIS — E1142 Type 2 diabetes mellitus with diabetic polyneuropathy: Secondary | ICD-10-CM | POA: Diagnosis not present

## 2016-03-17 DIAGNOSIS — L608 Other nail disorders: Secondary | ICD-10-CM

## 2016-03-17 DIAGNOSIS — L98499 Non-pressure chronic ulcer of skin of other sites with unspecified severity: Secondary | ICD-10-CM | POA: Diagnosis not present

## 2016-03-17 DIAGNOSIS — E11622 Type 2 diabetes mellitus with other skin ulcer: Secondary | ICD-10-CM | POA: Diagnosis not present

## 2016-03-17 MED ORDER — SILVER SULFADIAZINE 1 % EX CREA: 1.0000 "application " | TOPICAL_CREAM | Freq: Every day | CUTANEOUS | 0 refills | Status: DC

## 2016-03-17 NOTE — Progress Notes (Signed)
Patient ID: [name], female   DOB: May 31, 1929, 82 y.o.   MRN: [id number]    This patient presents again at six-week intervals for debridement of pre-ulcerative plantar callus first MPJ bilaterally. She has been instructed to wear diabetic shoes. Patient states that she is notice some increased dried blood in the plantar skin ulcer left foot since the visit of 02/04/2016. She denies any any drainage or warmth in the area.   Objective: Orientated 3 Hammertoe 2-5 bilaterally Bleeding callus plantar first MPJ left that breaks down into a superficial ulcer with a 5 mm granular place within the 2 cm macerated area. Is no surrounding erythema, edema, drainage, warmth Plantar callus sub-first MPJ right Incurvated toenails 6-10 DP and PT pulses 2/4 bilaterally Sensation to 10 g monofilament wire intact 2/5 right 0/5 left Vibratory sensation nonreactive bilaterally Ankle reflex equal and reactive bilaterally Incurvated toenails 6-10   Assessment: Diabetic peripheral neuropathy Superficial skin ulcer plantar left first MPJ without clinical sign of infection Incurvated toenails 6-10  Plan: Debride for official ulcer plantar left first MPJ t and apply Silvadene cream. (Please note patient has a history of sulfur, however she says this GI only and denies any history of respiratory distress or skin lesions with oral sulfur). Debride plantar callus right first MPJ Debride incurvated toenails 6-10 without any bleeding Attached felt pad to insole on patient's athletic style shoe Patient advised to wear existing diabetic shoes Patient will continue apply Silvadene cream once daily to skin ulcer on the plantar aspect left foot and cover with a Band-Aid Instructed patient if she knows any sudden increase in pain, swelling, redness to present to the emergency department  Reappoint 7 days

## 2016-03-17 NOTE — Patient Instructions (Signed)
Apply the Silvadene cream to the skin ulcer on the bottom of the left foot daily and cover with a and a Where your diabetic shoes or the athletic shoe that I attached a felt pad was attached If you notice any sudden increased pain, swelling, redness present to ED   Diabetes and Foot Care Diabetes Mabus cause you to have problems because of poor blood supply (circulation) to your feet and legs. This Michalowski cause the skin on your feet to become thinner, break easier, and heal more slowly. Your skin [place] become dry, and the skin Cadotte peel and crack. You Fehrenbach also have nerve damage in your legs and feet causing decreased feeling in them. You Eckles not notice minor injuries to your feet that could lead to infections or more serious problems. Taking care of your feet is one of the most important things you can do for yourself. Follow these instructions at home:  Wear shoes at all times, even in the house. Do not go barefoot. Bare feet are easily injured.  Check your feet daily for blisters, cuts, and redness. If you cannot see the bottom of your feet, use a mirror or ask someone for help.  Wash your feet with warm water (do not use hot water) and mild soap. Then pat your feet and the areas between your toes until they are completely dry. Do not soak your feet as this can dry your skin.  Apply a moisturizing lotion or petroleum jelly (that does not contain alcohol and is unscented) to the skin on your feet and to dry, brittle toenails. Do not apply lotion between your toes.  Trim your toenails straight across. Do not dig under them or around the cuticle. File the edges of your nails with an emery board or nail file.  Do not cut corns or calluses or try to remove them with medicine.  Wear clean socks or stockings every day. Make sure they are not too tight. Do not wear knee-high stockings since they Kurtenbach decrease blood flow to your legs.  Wear shoes that fit properly and have enough cushioning. To break in new  shoes, wear them for just a few hours a day. This prevents you from injuring your feet. Always look in your shoes before you put them on to be sure there are no objects inside.  Do not cross your legs. This Krul decrease the blood flow to your feet.  If you find a minor scrape, cut, or break in the skin on your feet, keep it and the skin around it clean and dry. These areas Hallstrom be cleansed with mild soap and water. Do not cleanse the area with peroxide, alcohol, or iodine.  When you remove an adhesive bandage, be sure not to damage the skin around it.  If you have a wound, look at it several times a day to make sure it is healing.  Do not use heating pads or hot water bottles. They Vandalen burn your skin. If you have lost feeling in your feet or legs, you Deharo not know it is happening until it is too late.  Make sure your health care provider performs a complete foot exam at least annually or more often if you have foot problems. Report any cuts, sores, or bruises to your health care provider immediately. Contact a health care provider if:  You have an injury that is not healing.  You have cuts or breaks in the skin.  You have an ingrown nail.  You notice redness on your legs or feet.  You feel burning or tingling in your legs or feet.  You have pain or cramps in your legs and feet.  Your legs or feet are numb.  Your feet always feel cold. Get help right away if:  There is increasing redness, swelling, or pain in or around a wound.  There is a red line that goes up your leg.  Pus is coming from a wound.  You develop a fever or as directed by your health care provider.  You notice a bad smell coming from an ulcer or wound. This information is not intended to replace advice given to you by your health care provider. Make sure you discuss any questions you have with your health care provider. Document Released: 04/17/2000 Document Revised: 09/26/2015 Document Reviewed:  09/27/2012 Elsevier Interactive Patient Education  2017 [employer].

## 2016-03-17 NOTE — Telephone Encounter (Signed)
Pt called states the rx is not at the [facility] on [address]. I reviewed the Medication orders and [facility] on [address] has confirmed the rx received 12:57pm today. Unable to leave a message the phone number has been changed.

## 2016-03-17 NOTE — Telephone Encounter (Signed)
Pt left message stating she would like the Silvadene cream to go to the [facility]. Silvadene sent to [facility].

## 2016-03-18 ENCOUNTER — Telehealth: Payer: Self-pay | Admitting: Endocrinology

## 2016-03-18 DIAGNOSIS — E1151 Type 2 diabetes mellitus with diabetic peripheral angiopathy without gangrene: Secondary | ICD-10-CM

## 2016-03-18 NOTE — Telephone Encounter (Signed)
Patient stated she can't take medication Bromocriptine because it interfere with other medication please advise

## 2016-03-18 NOTE — Telephone Encounter (Signed)
See message and please advise, Thanks!  

## 2016-03-18 NOTE — Telephone Encounter (Signed)
Alternative is to back to the insulin, which I think is best.  OK with you?

## 2016-03-19 NOTE — Telephone Encounter (Signed)
Ok, I did referral 

## 2016-03-19 NOTE — Telephone Encounter (Signed)
I contacted the patent and advised of message via voicemail. Requested a call back if the patient would like to discuss further.

## 2016-03-19 NOTE — Telephone Encounter (Signed)
Please call this she is very upset at the fact Loanne Drilling want her taking insulin

## 2016-03-19 NOTE — Telephone Encounter (Signed)
I contacted the patient and she stated she would like to meet with [name] before starting insulin injections. Patient stated she would prefer the once a day insulin over the meal time insulin.

## 2016-03-24 ENCOUNTER — Encounter: Payer: Self-pay | Admitting: Podiatry

## 2016-03-24 ENCOUNTER — Ambulatory Visit (INDEPENDENT_AMBULATORY_CARE_PROVIDER_SITE_OTHER): Payer: Medicare Other | Admitting: Podiatry

## 2016-03-24 VITALS — BP 148/90 | HR 79 | Resp 16

## 2016-03-24 DIAGNOSIS — L84 Corns and callosities: Secondary | ICD-10-CM | POA: Diagnosis not present

## 2016-03-24 DIAGNOSIS — E1142 Type 2 diabetes mellitus with diabetic polyneuropathy: Secondary | ICD-10-CM

## 2016-03-24 NOTE — Progress Notes (Signed)
Patient ID: [name], female   DOB: 04-16-1930, 82 y.o.   MRN: [id number]   Subjective: This patient presents for follow-up from the visit of 03/17/2016. At that time a superficial skin ulcer was diagnosed and treated on the plantar aspect left first MPJ. Patient currently applying Silvadene cream to this lesion and covering with a Band-Aid light gauze and wearing diabetic shoes   Objective: Orientated 3 Hammertoe 2-5 bilaterally Bleeding callus plantar left first MPJ without any surrounding erythema, edema, drainage, warmth. Lesion remains closed after debridement Plantar callus sub-first MPJ right Incurvated toenails 6-10 DP and PT pulses 2/4 bilaterally Sensation to 10 g monofilament wire intact 2/5 right 0/5 left Vibratory sensation nonreactive bilaterally Ankle reflex equal and reactive bilaterally Incurvated toenails 6-10   Assessment: Diabetic peripheral neuropathy Pre-ulcerative plantar callus left first MPJ  Plan: DC Silvadene cream Debrided pre-ulcerative plantar callus left Continue to wear diabetic shoes with Plastizote insoles and additional felt to offload the plantar first MPJ left  Reappoint 6 weeks

## 2016-03-24 NOTE — Patient Instructions (Signed)
Where your existing diabetic shoes with custom insoles on ongoing continuous daily basis  Diabetes and Foot Care Diabetes Gasbarro cause you to have problems because of poor blood supply (circulation) to your feet and legs. This Fahr cause the skin on your feet to become thinner, break easier, and heal more slowly. Your skin Cage become dry, and the skin Votta peel and crack. You Blumstein also have nerve damage in your legs and feet causing decreased feeling in them. You Sausedo not notice minor injuries to your feet that could lead to infections or more serious problems. Taking care of your feet is one of the most important things you can do for yourself. Follow these instructions at home:  Wear shoes at all times, even in the house. Do not go barefoot. Bare feet are easily injured.  Check your feet daily for blisters, cuts, and redness. If you cannot see the bottom of your feet, use a mirror or ask someone for help.  Wash your feet with warm water (do not use hot water) and mild soap. Then pat your feet and the areas between your toes until they are completely dry. Do not soak your feet as this can dry your skin.  Apply a moisturizing lotion or petroleum jelly (that does not contain alcohol and is unscented) to the skin on your feet and to dry, brittle toenails. Do not apply lotion between your toes.  Trim your toenails straight across. Do not dig under them or around the cuticle. File the edges of your nails with an emery board or nail file.  Do not cut corns or calluses or try to remove them with medicine.  Wear clean socks or stockings every day. Make sure they are not too tight. Do not wear knee-high stockings since they Stettler decrease blood flow to your legs.  Wear shoes that fit properly and have enough cushioning. To break in new shoes, wear them for just a few hours a day. This prevents you from injuring your feet. Always look in your shoes before you put them on to be sure there are no objects  inside.  Do not cross your legs. This Skarda decrease the blood flow to your feet.  If you find a minor scrape, cut, or break in the skin on your feet, keep it and the skin around it clean and dry. These areas Wessell be cleansed with mild soap and water. Do not cleanse the area with peroxide, alcohol, or iodine.  When you remove an adhesive bandage, be sure not to damage the skin around it.  If you have a wound, look at it several times a day to make sure it is healing.  Do not use heating pads or hot water bottles. They Erdmann burn your skin. If you have lost feeling in your feet or legs, you Lutzke not know it is happening until it is too late.  Make sure your health care provider performs a complete foot exam at least annually or more often if you have foot problems. Report any cuts, sores, or bruises to your health care provider immediately. Contact a health care provider if:  You have an injury that is not healing.  You have cuts or breaks in the skin.  You have an ingrown nail.  You notice redness on your legs or feet.  You feel burning or tingling in your legs or feet.  You have pain or cramps in your legs and feet.  Your legs or feet are numb.  Your feet always feel cold. Get help right away if:  There is increasing redness, swelling, or pain in or around a wound.  There is a red line that goes up your leg.  Pus is coming from a wound.  You develop a fever or as directed by your health care provider.  You notice a bad smell coming from an ulcer or wound. This information is not intended to replace advice given to you by your health care provider. Make sure you discuss any questions you have with your health care provider. Document Released: 04/17/2000 Document Revised: 09/26/2015 Document Reviewed: 09/27/2012 Elsevier Interactive Patient Education  2017 [employer].

## 2016-03-30 ENCOUNTER — Other Ambulatory Visit: Payer: Self-pay | Admitting: Family Medicine

## 2016-03-30 ENCOUNTER — Ambulatory Visit
Admission: RE | Admit: 2016-03-30 | Discharge: 2016-03-30 | Disposition: A | Discharge status: Home / Self Care | Payer: Medicare Other | Source: Ambulatory Visit | Attending: Family Medicine | Admitting: Family Medicine

## 2016-03-30 DIAGNOSIS — N6310 Unspecified lump in the right breast, unspecified quadrant: Secondary | ICD-10-CM

## 2016-03-30 DIAGNOSIS — N6489 Other specified disorders of breast: Secondary | ICD-10-CM | POA: Diagnosis not present

## 2016-03-30 DIAGNOSIS — R921 Mammographic calcification found on diagnostic imaging of breast: Secondary | ICD-10-CM

## 2016-03-31 DIAGNOSIS — E119 Type 2 diabetes mellitus without complications: Secondary | ICD-10-CM | POA: Diagnosis not present

## 2016-04-06 DIAGNOSIS — M810 Age-related osteoporosis without current pathological fracture: Secondary | ICD-10-CM | POA: Diagnosis not present

## 2016-04-06 DIAGNOSIS — I10 Essential (primary) hypertension: Secondary | ICD-10-CM | POA: Diagnosis not present

## 2016-04-06 DIAGNOSIS — Z6821 Body mass index (BMI) 21.0-21.9, adult: Secondary | ICD-10-CM | POA: Diagnosis not present

## 2016-04-06 DIAGNOSIS — E119 Type 2 diabetes mellitus without complications: Secondary | ICD-10-CM | POA: Diagnosis not present

## 2016-04-13 DIAGNOSIS — H5000 Unspecified esotropia: Secondary | ICD-10-CM | POA: Diagnosis not present

## 2016-04-15 ENCOUNTER — Telehealth: Payer: Self-pay | Admitting: Endocrinology

## 2016-04-15 NOTE — Telephone Encounter (Signed)
Please call patient, she is talking about her insurance covering an appt, do not remember what it is.  please advise.

## 2016-04-16 NOTE — Telephone Encounter (Signed)
I contacted the patient on 04/15/2016. She wanted to verify if Medicare would cover the nutrition visit Dr. Loanne Drilling had referred her to. I advised I was not sure about this and advised for her to either contact medicare to verify or the nutrition center. Patient voiced understanding and had no further questions.

## 2016-04-20 ENCOUNTER — Other Ambulatory Visit: Payer: Self-pay | Admitting: Neurology

## 2016-04-20 DIAGNOSIS — M546 Pain in thoracic spine: Secondary | ICD-10-CM | POA: Diagnosis not present

## 2016-04-23 NOTE — Telephone Encounter (Signed)
See message and please advise. I could not locate this medication on the current list?

## 2016-04-23 NOTE — Telephone Encounter (Signed)
Pharmacy haven't received starlix medication please advise   [place], [place] [phone] (Phone) [phone] (Fax)

## 2016-04-25 ENCOUNTER — Other Ambulatory Visit: Payer: Self-pay | Admitting: Endocrinology

## 2016-04-25 MED ORDER — PRANDIN 2 MG PO TABS: 2.0000 mg | ORAL_TABLET | Freq: Three times a day (TID) | ORAL | 11 refills | Status: DC

## 2016-04-25 NOTE — Telephone Encounter (Signed)
She is on repaglinide, not nateglinide (Prandin).   I have refilled the repaglinide prn.

## 2016-04-28 ENCOUNTER — Ambulatory Visit: Payer: Medicare Other | Admitting: Nutrition

## 2016-04-29 NOTE — Telephone Encounter (Signed)
I contacted the patient and advised message. Patient stated she had contacted her Pharmacy ([facility]) and was advised the rx for starlix could be sent to her if a order was submitted. Patient stated the Starlix worked better for her than the prandin. Patient stated she has had a few readings in the 200's while on the prandin. Pateint wanted to know if the prandin could be discontinued and the sarlix could be submitted. Patient was advised Dr. Loanne Drilling is currently out of town and she was ok with waiting for him to return to address this question.

## 2016-04-30 NOTE — Telephone Encounter (Signed)
I contacted the patient and advised of message. Patient voiced understanding and had no further questions at this time.  

## 2016-04-30 NOTE — Telephone Encounter (Signed)
please call patient: Please continue the same repaglinide, as last a1c was good.

## 2016-05-05 ENCOUNTER — Ambulatory Visit: Payer: Medicare Other | Admitting: Nutrition

## 2016-05-05 ENCOUNTER — Ambulatory Visit: Payer: Medicare Other | Admitting: Podiatry

## 2016-05-05 DIAGNOSIS — M810 Age-related osteoporosis without current pathological fracture: Secondary | ICD-10-CM | POA: Diagnosis not present

## 2016-05-11 DIAGNOSIS — E119 Type 2 diabetes mellitus without complications: Secondary | ICD-10-CM | POA: Diagnosis not present

## 2016-05-11 DIAGNOSIS — Z6821 Body mass index (BMI) 21.0-21.9, adult: Secondary | ICD-10-CM | POA: Diagnosis not present

## 2016-05-11 DIAGNOSIS — M810 Age-related osteoporosis without current pathological fracture: Secondary | ICD-10-CM | POA: Diagnosis not present

## 2016-05-11 DIAGNOSIS — I10 Essential (primary) hypertension: Secondary | ICD-10-CM | POA: Diagnosis not present

## 2016-05-12 ENCOUNTER — Encounter: Payer: Self-pay | Admitting: Podiatry

## 2016-05-12 ENCOUNTER — Ambulatory Visit (INDEPENDENT_AMBULATORY_CARE_PROVIDER_SITE_OTHER): Payer: Medicare Other | Admitting: Podiatry

## 2016-05-12 ENCOUNTER — Encounter: Payer: Medicare Other | Admitting: Nutrition

## 2016-05-12 VITALS — BP 153/90 | HR 76 | Temp 96.0°F | Resp 14

## 2016-05-12 DIAGNOSIS — E11622 Type 2 diabetes mellitus with other skin ulcer: Secondary | ICD-10-CM | POA: Diagnosis not present

## 2016-05-12 DIAGNOSIS — L98499 Non-pressure chronic ulcer of skin of other sites with unspecified severity: Secondary | ICD-10-CM

## 2016-05-12 DIAGNOSIS — L02612 Cutaneous abscess of left foot: Secondary | ICD-10-CM | POA: Diagnosis not present

## 2016-05-12 MED ORDER — SULFAMETHOXAZOLE-TRIMETHOPRIM 800-160 MG PO TABS: 1.0000 | ORAL_TABLET | Freq: Two times a day (BID) | ORAL | 1 refills | Status: DC

## 2016-05-12 NOTE — Progress Notes (Signed)
   Subjective:    Patient ID: [name], female    DOB: [birth year], 83 y.o.   MRN: [id number]  HPI     Patient presents today with approximately a week and half history of a painful swollen red second left toe. Patient notices that it began as a blister and became progressively more red or swollen and notice some drainage from the area. Patient has had no self treatment rate any other professional treatment Patient is diabetic with a history of neuropathy   Review of Systems  All other systems reviewed and are negative.      Objective:   Physical Exam  Patient orientated 3 and appears in no apparent pain  BP of 153/90 Pulse 76 Respiration 14 Temperature 96.0 Fahrenheit  Objective: Orientated 3 No peripheral edema or calf edema or calf tenderness bilaterally DP and PT pulses 2/4 bilaterally Capillary reflex immediate bilaterally  Neurological: Sensation to 10 g monofilament wire intact 1/5 right 0/5 left Vibratory sensation nonreactive bilaterally Ankle reflex equal and reactive bilaterally  Dermatological: Erythematous edematous second left toe. There is blistering with bloody drainage in the distal second left toe. Erythema extends from the distal toe proximally to the MPJ. There is low-grade warmth Plantar callus first MPJ bilaterally  Musculoskeletal: Second left toe is contracted and rigid at the PIPJ       Assessment & Plan:   Assessment: Abscess cellulitis second toe left foot  Plan: Puncture and debrided up abscess and submit drainage for culture and sensitivity Apply antibiotic dressing Dispensed surgical shoe Rx Septra DS by mouth twice a day 7 days Rx Betadine soaks daily application topical antibiotic ointment to the abscess site Please note patient has multiple drug allergies and when questioned about sulfur she recalls nausea Patient advised that she knows any sudden increase or redness, pain, swelling present emergency  department   Reappoint 7 days

## 2016-05-12 NOTE — Patient Instructions (Signed)
Begin taking Bactrim DS by mouth one twice a day Soak your foot at least once a day and Betadine and apply topical antibiotic ointment such as triple antibiotic ointment and cover with gauze If you develop any sudden increase in pain, swelling, redness, fever present to emergency department   Betadine Soak Instructions  Purchase an 8 oz. bottle of BETADINE solution (Povidone)  THE DAY AFTER THE PROCEDURE  Place 1 tablespoon of betadine solution in a quart of warm tap water.  Submerge your foot or feet with outer bandage intact for the initial soak; this will allow the bandage to become moist and wet for easy lift off.  Once you remove your bandage, continue to soak in the solution for 2 minutes.  This soak should be done twice a day.  Next, remove your foot or feet from solution, blot dry the affected area and cover.  You Wettstein use a band aid large enough to cover the area or use gauze and tape.  Apply other medications to the area as directed by the doctor such as cortisporin otic solution (ear drops) or neosporin.  IF YOUR SKIN BECOMES IRRITATED WHILE USING THESE INSTRUCTIONS, IT IS OKAY TO SWITCH TO EPSOM SALTS AND WATER OR WHITE VINEGAR AND WATER.

## 2016-05-13 ENCOUNTER — Other Ambulatory Visit: Payer: Self-pay

## 2016-05-13 ENCOUNTER — Telehealth: Payer: Self-pay | Admitting: *Deleted

## 2016-05-13 NOTE — Patient Outreach (Signed)
[place] [facility]) Care Management  05/13/2016  [name] Jan 19, 1930 [id number]  Referral received from primary care office regarding starting insulin. RNCM spoke with client. Two patient identifiers confirmed. History of diabetes, numerous falls, brain tumor (removed), seizures.  Medications-Client reports she is going to start insulin when her medications come. [facility]. Client reports she was on Starlix at one time, but was unable to continue to get the brand name. Client reports difficulty with getting approval for Brand name through insurance company. Therefore she has stopped taking it. Cllient also reports she will be running out of strips sooner than before because she is taking her blood sugar readings more often now. Pharmacy referral.  History of Diabetes-difficulty managing blood sugars. Today blood sugar was 227.  Wound left foot second toe-managed by Dr. Little [name].  Plan: community care coordinator and pharmacy referral.  Yolanda Silversmith, RN, MSN, [place] Coordinator Cell: [phone]

## 2016-05-13 NOTE — Telephone Encounter (Signed)
Valinda Hoar states has questions concerning pt's specimen. I spoke with [place] and she states the specimen was in the bag, but the specimen was not labeled, and even with the orders in the bag with the specimen the label must be on the specimen. [place] Siren is sending a form to be signed by the person that ordered and bagged the lab.

## 2016-05-15 LAB — WOUND CULTURE: Gram Stain: NONE SEEN

## 2016-05-18 ENCOUNTER — Other Ambulatory Visit: Payer: Self-pay

## 2016-05-18 NOTE — Patient Outreach (Signed)
Telephone assessment:  New referral for DM.  Placed call to patient who answered and reports that she is having problems with her DM. Reports that her DM has been out of control for greater than 1 month. Patient reports that her CBG run 200-300. Reports that she has cancelled all appointments with Dr. Loanne Drilling and now Dr. Ernie Hew is managing her DM.  Reports that she is waiting for her insulin to arrive by mail. Reports that she has took insulin in 1995 and has given herself b12 injections in the past.  Patient is concerned about her wound on her toe. Reports that she has follow up planned with Dr. Amalia Hailey tomorrow.  Reports MD appointment for her back pain on Monday 05/25/2016.  Offered home visit for assessment on 1/ 24 and patient has accepted.   Confirmed address. Patient reports a red bow on mailbox to alert that it is the correct driveway.   Provided my contact name and phone number if patient needs me sooner than 05/27/2016.  PLAN: Home visit on 05/27/2016 at 11am.  Provided contact information.  Tomasa Rand, RN, BSN, CEN [facility] [employer] [phone]

## 2016-05-19 ENCOUNTER — Ambulatory Visit (INDEPENDENT_AMBULATORY_CARE_PROVIDER_SITE_OTHER): Payer: Medicare Other | Admitting: Podiatry

## 2016-05-19 ENCOUNTER — Encounter: Payer: Self-pay | Admitting: Podiatry

## 2016-05-19 VITALS — BP 145/85 | HR 90 | Temp 96.8°F | Resp 14

## 2016-05-19 DIAGNOSIS — L02612 Cutaneous abscess of left foot: Secondary | ICD-10-CM

## 2016-05-19 DIAGNOSIS — E11622 Type 2 diabetes mellitus with other skin ulcer: Secondary | ICD-10-CM | POA: Diagnosis not present

## 2016-05-19 DIAGNOSIS — L98499 Non-pressure chronic ulcer of skin of other sites with unspecified severity: Secondary | ICD-10-CM

## 2016-05-19 MED ORDER — SULFAMETHOXAZOLE-TRIMETHOPRIM 800-160 MG PO TABS: 1.0000 | ORAL_TABLET | Freq: Two times a day (BID) | ORAL | 1 refills | Status: DC

## 2016-05-19 NOTE — Progress Notes (Signed)
Patient ID: [name], female   DOB: 12-09-1929, 83 y.o.   MRN: [id number]   Subjective: This patient presents from the follow-up visit of 05/12/2016 for treatment of a distal ulcer and cellulitis on the second left toe. Patient states that she was able to tolerate the Bactrim DS until the last dose out of 7 days. She describes nausea. The nausea is gradually improving. She denies any other bodily complaints including skin rashes, diarrhea, difficulty breathing. Rashes states that the surgical shoe that was dispensed was difficult for her to tolerate and she's not wearing  Patient is diabetic and is having difficulty controlling blood glucose levels recently  Objective: BP 145/85 Pulse 90 Respiration 14 Temperature 96.8 Fahrenheit Patient is responsive and does not appear to be in apparent pain   Objective: Orientated 3 No peripheral edema or calf edema or calf tenderness bilaterally DP and PT pulses 2/4 bilaterally Capillary reflex immediate bilaterally  Neurological: Sensation to 10 g monofilament wire intact 1/5 right 0/5 left Vibratory sensation nonreactive bilaterally Ankle reflex equal and reactive bilaterally  Dermatological: Erythematous edematous distal second left toe with a 5 mm superficial ulcer. There is no active drainage. There scaling over the distal second left toe and after debridement reveals a granular ulcer base. The erythema has receded from the initial visit of 05/12/2016 extending beyond the dorsal MPJ  Musculoskeletal: Second left toe is contracted and rigid at the PIPJ  Results of bone culture dated 05/12/2016: Abundant Klebsiella pneumoniae Abundant gram-negative rods Moderate gram-positive cocci in pairs and clusters  Sensitivity includes Bactrim Levaquin Cipro Augmentin  Assessment: Reducing cellulitis second right toe Ulcer distal second left toe Hammertoe second left toe Patient has multiple drug intolerances to antibiotics including the  above-mentioned  Plan: Debrided ulcer on second left toe and apply antibiotic ointment dressing Dispensed silicone toe prop elevate toes 2-5 left Maintain Betadine soaks Patient advised to wait several days and resume Bactrim DS twice a day to her tolerance Advised that if she develops any sudden increase in pain, swelling, redness, fever, warmth, drainage present to the emergency department  Reappoint 7 days

## 2016-05-19 NOTE — Patient Instructions (Signed)
Weight several days for the nausea to resolve and try to restart the Bactrim DS by mouth one twice a day Continue to soak the toe once daily 1-2 minutes and Betadine Apply topical antibiotic ointment to the end of the toe daily Wear the toe prop on the third left toe Please bring in the surgical shoe so we can check for sizing If you develop any sudden increase in pain, swelling, redness, fever present to the emergency department  Reappoint 7 days

## 2016-05-25 DIAGNOSIS — M546 Pain in thoracic spine: Secondary | ICD-10-CM | POA: Diagnosis not present

## 2016-05-25 DIAGNOSIS — I10 Essential (primary) hypertension: Secondary | ICD-10-CM | POA: Diagnosis not present

## 2016-05-26 ENCOUNTER — Ambulatory Visit (INDEPENDENT_AMBULATORY_CARE_PROVIDER_SITE_OTHER): Payer: Medicare Other | Admitting: Podiatry

## 2016-05-26 ENCOUNTER — Encounter: Payer: Self-pay | Admitting: Podiatry

## 2016-05-26 VITALS — BP 118/69 | HR 87 | Resp 14

## 2016-05-26 DIAGNOSIS — L98499 Non-pressure chronic ulcer of skin of other sites with unspecified severity: Secondary | ICD-10-CM | POA: Diagnosis not present

## 2016-05-26 DIAGNOSIS — E11622 Type 2 diabetes mellitus with other skin ulcer: Secondary | ICD-10-CM

## 2016-05-26 NOTE — Progress Notes (Signed)
Patient ID: [name], female   DOB: [birth year], 83 y.o.   MRN: [id number]   Subjective: This patient presents for follow-up care for ulcers cellulitis onset symptoms approximately 05/05/2016 with initial office visit on 05/12/2016. Patient was able to tolerate 6 of 7 days of Bactrim DS prescribed initially on the visit of 05/12/2016. On the visit of 05/19/2016 patient was instructed to allow the nausea vomiting to resolve and restart the Bactrim DS. Patient was able to tolerate an additional 3 days of Bactrim DS for a total of 9 days. At this time patient is nausea is resolving   Objective: Orientated 3 No peripheral edema or calf edema or calf tenderness bilaterally DP and PT pulses 2/4 bilaterally Capillary reflex immediate bilaterally  Neurological: Sensation to 10 g monofilament wire intact 1/5 right 0/5 left Vibratory sensation nonreactive bilaterally Ankle reflex equal and reactive bilaterally  Dermatological: Erythematous edematous distal second left toe with a 2 mm superficial ulcer. There is no active drainage. There scaling over the distal second left toe and after debridement reveals a granular ulcer base. The erythema has receded from the initial visit of 05/12/2016 and now is localized to the distal toe. There is no active drainage, warmth, malodor  Musculoskeletal: Second left toe is contracted and rigid at the PIPJ  Results of bone culture dated 05/12/2016: Abundant Klebsiella pneumoniae Abundant gram-negative rods Moderate gram-positive cocci in pairs and clusters  Sensitivity includes Bactrim Levaquin Cipro Augmentin  Assessment: Reducing cellulitis second right toe Superficial distal second left toe Hammertoe second left toe Patient has multiple drug intolerances to antibiotics including the above-mentioned  Plan: Debrided ulcer on second left toe and apply antibiotic ointment dressing Continue using silicone toe prop elevate toes 2-5 left Continue  surgical shoe on right foot Maintain Betadine soaks Patient advised to wait several days and resume Bactrim DS twice a day to her tolerance Advised that if she develops any sudden increase in pain, swelling, redness, fever, warmth, drainage present to the emergency department  Reappoint 7 days

## 2016-05-26 NOTE — Patient Instructions (Signed)
After the nausea resolves continue to take the Bactrim DS one twice a day Continue to wear the toe prop on a continuous basis Continue to wear the surgical shoe If you develop any sudden increase of redness, fever,, drainage, pain present to emergency department   Diabetes and Foot Care Diabetes Rauth cause you to have problems because of poor blood supply (circulation) to your feet and legs. This Werntz cause the skin on your feet to become thinner, break easier, and heal more slowly. Your skin Mcvey become dry, and the skin Kipnis peel and crack. You Lowden also have nerve damage in your legs and feet causing decreased feeling in them. You Morel not notice minor injuries to your feet that could lead to infections or more serious problems. Taking care of your feet is one of the most important things you can do for yourself. Follow these instructions at home:  Wear shoes at all times, even in the house. Do not go barefoot. Bare feet are easily injured.  Check your feet daily for blisters, cuts, and redness. If you cannot see the bottom of your feet, use a mirror or ask someone for help.  Wash your feet with warm water (do not use hot water) and mild soap. Then pat your feet and the areas between your toes until they are completely dry. Do not soak your feet as this can dry your skin.  Apply a moisturizing lotion or petroleum jelly (that does not contain alcohol and is unscented) to the skin on your feet and to dry, brittle toenails. Do not apply lotion between your toes.  Trim your toenails straight across. Do not dig under them or around the cuticle. File the edges of your nails with an emery board or nail file.  Do not cut corns or calluses or try to remove them with medicine.  Wear clean socks or stockings every day. Make sure they are not too tight. Do not wear knee-high stockings since they Follett decrease blood flow to your legs.  Wear shoes that fit properly and have enough cushioning. To break in new  shoes, wear them for just a few hours a day. This prevents you from injuring your feet. Always look in your shoes before you put them on to be sure there are no objects inside.  Do not cross your legs. This Keckler decrease the blood flow to your feet.  If you find a minor scrape, cut, or break in the skin on your feet, keep it and the skin around it clean and dry. These areas Kipper be cleansed with mild soap and water. Do not cleanse the area with peroxide, alcohol, or iodine.  When you remove an adhesive bandage, be sure not to damage the skin around it.  If you have a wound, look at it several times a day to make sure it is healing.  Do not use heating pads or hot water bottles. They Thrun burn your skin. If you have lost feeling in your feet or legs, you Wegman not know it is happening until it is too late.  Make sure your health care provider performs a complete foot exam at least annually or more often if you have foot problems. Report any cuts, sores, or bruises to your health care provider immediately. Contact a health care provider if:  You have an injury that is not healing.  You have cuts or breaks in the skin.  You have an ingrown nail.  You notice redness on your  legs or feet.  You feel burning or tingling in your legs or feet.  You have pain or cramps in your legs and feet.  Your legs or feet are numb.  Your feet always feel cold. Get help right away if:  There is increasing redness, swelling, or pain in or around a wound.  There is a red line that goes up your leg.  Pus is coming from a wound.  You develop a fever or as directed by your health care provider.  You notice a bad smell coming from an ulcer or wound. This information is not intended to replace advice given to you by your health care provider. Make sure you discuss any questions you have with your health care provider. Document Released: 04/17/2000 Document Revised: 09/26/2015 Document Reviewed:  09/27/2012 Elsevier Interactive Patient Education  2017 [employer].

## 2016-05-27 ENCOUNTER — Other Ambulatory Visit: Payer: Self-pay

## 2016-05-27 NOTE — Patient Outreach (Addendum)
[name]) Care Management   05/27/2016  [name] May 10, 1929 [id number]  Camara Renstrom Mccleave is an 83 y.o. female 1 Arrived for home visit.   Caregiver present.  Subjective: Patient reports to me that she has still not gotten her insulin. Patient reports that she takes one metformin daily due to this causes her diarrhea. States that she has called MD office.  Reports that she can not find out anything from [facility].  Patient reports that she is very worried about her blood sugar readings. ( see averaged greater than 250).  Patient reports that she is allergic to everything.  Reports that generic medications do not work for her and she struggles to make people understand this.  She reports that she as a wound on her left 2nd toe that started out as a blood blister. States that she saw her "foot specialist" and was told that it was because of her poorly control blood sugar.   Patient lives alone and has caregivers 5 days per week. Patient reports MD asked her to monitor her CBG 3 times per day and she states that she does not like to do this so often because her fingers are sore.   Objective:  Awake and alert. Home neat and clean.  Ambulated with limp due to wooden shoe on the left foot.  Dressing intact to left 2nd toe applied by Dr. Amalia Hailey yesterday.  Unable to visualize toe. Patient was instructed not to remove dressing for 1-2 days.  Vitals:   05/27/16 1111  BP: 132/70  Pulse: 89  Resp: 18  SpO2: 98%  Weight: 132 lb (59.9 kg)  Height: 1.702 m ('5\' 7"'$ )   Review of Systems  Constitutional: Negative.   HENT: Negative.   Eyes: Positive for double vision.  Respiratory: Negative.   Cardiovascular: Negative.   Gastrointestinal: Negative.   Genitourinary: Negative.   Musculoskeletal: Positive for falls and joint pain.  Skin:       Reports wound on the left 2nd toe.  Neurological: Negative.   Endo/Heme/Allergies: Negative.   Psychiatric/Behavioral: Negative.      Physical Exam  Constitutional: She is oriented to person, place, and time. She appears well-developed and well-nourished.  Cardiovascular: Normal rate, regular rhythm, normal heart sounds and intact distal pulses.   Respiratory: Effort normal and breath sounds normal.  GI: Soft. Bowel sounds are normal.  Musculoskeletal: Normal range of motion. She exhibits no edema.  Neurological: She is alert and oriented to person, place, and time.  Skin: Skin is warm and dry.  Dressing intact to the left 2nd toe  Psychiatric: She has a normal mood and affect. Her behavior is normal. Judgment and thought content normal.    Encounter Medications:   Outpatient Encounter Prescriptions as of 05/27/2016  Medication Sig Note  . acetaminophen (TYLENOL) 500 MG tablet Take 1,000 mg by mouth every 6 (six) hours as needed for pain.   Marland Kitchen ALPRAZolam (XANAX) 0.25 MG tablet Take 1 tablet (0.25 mg total) by mouth at bedtime.   . Biotin 1000 MCG tablet Take 1,000 mcg by mouth 3 (three) times a week.   . Blood Glucose Monitoring Suppl (PRODIGY VOICE BLOOD GLUCOSE) w/Device KIT Use to check blood sugar 1 time per day.   . Cholecalciferol 5000 units capsule Take 5,000 Units by mouth daily.   . colestipol (COLESTID) 1 G tablet Take 1-2 g by mouth 3 (three) times daily. Take 2 tablets in the morning, 1 tablet at noon, and 2  tablets in the evening   . cyanocobalamin (,VITAMIN B-12,) 1000 MCG/ML injection INJECT 1 ML INTRAMUSCULARLY EVERY 21 DAYS   . escitalopram (LEXAPRO) 10 MG tablet Take 10 mg by mouth daily.   . Ferrous Sulfate 142 (45 Fe) MG TBCR Take 1 tablet by mouth daily.   . fexofenadine (ALLEGRA) 60 MG tablet Take 60 mg by mouth daily as needed for allergies.    Marland Kitchen glucose blood test strip Use to check blood sugar 1 time per day.   . levothyroxine (SYNTHROID, LEVOTHROID) 50 MCG tablet Take 1 tablet (50 mcg total) by mouth daily before breakfast.   . lidocaine (LIDODERM) 5 % Place 1 patch onto the skin daily.  Remove & Discard patch within 12 hours or as directed by MD (Patient taking differently: Place 1 patch onto the skin daily as needed (pain). Remove & Discard patch within 12 hours or as directed by MD)   . metFORMIN (GLUCOPHAGE-XR) 500 MG 24 hr tablet Take 1 tablet (500 mg total) by mouth daily with breakfast. 05/27/2016: Takes one tablet at bedtime only.  . nateglinide (STARLIX) 60 MG tablet Take 120 mg by mouth 3 (three) times daily with meals. 2 tablets 3 times per day   . OVER THE COUNTER MEDICATION Take 1 capsule by mouth daily. Recall Max memory supplement   . phenytoin (DILANTIN) 100 MG ER capsule One capsule in the morning and two at night   . PHENYTOIN INFATABS 50 MG tablet CHEW 1 TABLET DAILY   . Polyethyl Glycol-Propyl Glycol 0.4-0.3 % SOLN Place 2 drops into both eyes at bedtime.    . ranitidine (ZANTAC) 150 MG capsule Take 150 mg by mouth daily as needed for heartburn.    . sulfamethoxazole-trimethoprim (BACTRIM DS) 800-160 MG tablet Take 1 tablet by mouth 2 (two) times daily.   Marland Kitchen TARKA 2-180 MG per tablet TAKE 1 TABLET DAILY   . traMADol (ULTRAM) 50 MG tablet Take by mouth every 6 (six) hours as needed.   . Bromocriptine Mesylate 0.8 MG TABS Take 1 tablet (0.8 mg total) by mouth daily. (Patient not taking: Reported on 05/27/2016)   . calcium carbonate (OS-CAL) 600 MG TABS Take 600 mg by mouth daily. Reported on 09/03/2015   . cholecalciferol (VITAMIN D) 1000 units tablet Take 600 Units by mouth daily.    Marland Kitchen escitalopram (LEXAPRO) 5 MG tablet Take 1 tablet by mouth daily. Reported on 09/03/2015 08/19/2015: Pt stopped taking this past week  . furosemide (LASIX) 20 MG tablet Take 1 tablet (20 mg total) by mouth daily. Do not take until I call you about labs. (Patient not taking: Reported on 05/27/2016) 07/19/2014: .  Marland Kitchen PRANDIN 2 MG tablet Take 1 tablet (2 mg total) by mouth 3 (three) times daily before meals. (Patient not taking: Reported on 05/27/2016)   . silver sulfADIAZINE (SILVADENE) 1 % cream  Apply 1 application topically daily. (Patient not taking: Reported on 05/27/2016)    No facility-administered encounter medications on file as of 05/27/2016.     Functional Status:   In your present state of health, do you have any difficulty performing the following activities: 05/27/2016  Hearing? N  Vision? Y  Difficulty concentrating or making decisions? N  Walking or climbing stairs? Y  Dressing or bathing? N  Doing errands, shopping? Y  Preparing Food and eating ? Y  Using the Toilet? N  In the past six months, have you accidently leaked urine? N  Do you have problems with loss of bowel control?  N  Managing your Medications? Y  Managing your Finances? N  Housekeeping or managing your Housekeeping? Y  Some recent data might be hidden    Fall/Depression Screening:    PHQ 2/9 Scores 05/27/2016 05/13/2016 02/06/2013 11/19/2011  PHQ - 2 Score 0 0 0 0   Fall Risk  05/27/2016 02/06/2013  Falls in the past year? Yes Yes  Number falls in past yr: 2 or more 2 or more  Injury with Fall? Yes -  Risk Factor Category  High Fall Risk -  Risk for fall due to : History of fall(s);Impaired mobility Impaired mobility;Impaired balance/gait  Follow up Falls prevention discussed -   Assessment:   (1) Provided [facility] new patient packet.  Reviewed goals of [facility] program and confirmed interest in participation. Provided [facility] calendar and my contact information.  (2) patient reports insulin has not arrived. (3) wound on left 2nd toe.  Saw Dr. Amalia Hailey on 05/26/2016 (4) lives alone but has caregivers. (5) no advanced directives. (6) polypharmacy with multiple duplicate bottles in the home.  (7) reports 2 falls in the last year.   Plan:  (1) consent scanned into chart. (2) Placed call to [facility] and confirmed that order is processing and will be shipped in 3-5 days from today with 2 day delivery.  Placed order for [facility] pharmacy consult.  (3) encouraged patient to follow directions by Dr. Amalia Hailey.   Encouraged patient to take all medications as prescribed.  Reviewed reason why to call MD. Increased drainage, fever, swelling and redness.  (4) Encouraged patient to ask for assistance if needed. (5) Reviewed importance of completing advanced directives. Provided advance directive packet.  (6)referral to [place]. (7) reviewed fall risk.  Encouraged patient to use night light and avoid tripping hazards. Encouraged daily exercise.   Goals setting and care planning during home visit and primary goal is to learn how to use insulin and have better control of DM.   TELEPHONE CALL DURING HOME VISIT:  Placed call to [facility] while at patients home. Patient gave consent to express scripts to speak with me about any questions.  [facility] confirm order was received on 05/17/2016.  Needed approval which was received on 05/27/2016.  Expected ship date of 1/27-1/29 with predicted arrival of 1/29-1/31.  THN CM Care Plan Problem One   Flowsheet Row Most Recent Value  Care Plan Problem One  Patient reports DM uncontrolled with a new sore on left 2nd toe.   Role Documenting the Problem One  Care Management [place] for Problem One  Active  THN Long Term Goal (31-90 days)  Patient will report improved CBG readings in the next 45 days.  THN Long Term Goal Start Date  05/27/16  Interventions for Problem One Long Term Goal  Placed call to [facility] to inquire about insulin delivery.  Placed order for [name] pharmacy consult to assist patient.   THN CM Short Term Goal #1 (0-30 days)  Patient will reports delivery of insulin in the next 7 days.  THN CM Short Term Goal #1 Start Date  05/27/16  Interventions for Short Term Goal #1  reviewed importance of patient calling me when insulin arrives. Place call to [facility]. Will inquire from MD dose of insulin.  THN CM Short Term Goal #2 (0-30 days)  Patient will verbalize feeling comfortable giving herself insulin in the next 30 days.   THN CM Short Term Goal #2 Start Date  05/27/16  Interventions for Short  Term Goal #2  provided reassurace to patient that I would assist her with feeling comfortable with insulin. Home visit planned next week when insulin arrives.       This note routed to MD.  Tomasa Rand, RN, BSN, CEN [place] Coordinator [phone]

## 2016-05-29 ENCOUNTER — Other Ambulatory Visit: Payer: Self-pay

## 2016-05-29 NOTE — Patient Outreach (Signed)
Care coordination: Placed call to MD office to inquire about insulin dose and to report delay in patient receiving Insulin.  Spoke with Wells Guiles who states that patient dose of insulin is 10 units per day.  Read back and confirmed dose.   PLAN: will coordinate home visit with pharmacy when insulin arrives to patients home.  Tomasa Rand, RN, BSN, CEN [facility] [employer] [phone]

## 2016-06-01 DIAGNOSIS — H5000 Unspecified esotropia: Secondary | ICD-10-CM | POA: Diagnosis not present

## 2016-06-02 ENCOUNTER — Ambulatory Visit: Payer: Medicare Other | Admitting: Endocrinology

## 2016-06-02 ENCOUNTER — Ambulatory Visit (INDEPENDENT_AMBULATORY_CARE_PROVIDER_SITE_OTHER): Payer: Medicare Other | Admitting: Podiatry

## 2016-06-02 ENCOUNTER — Encounter: Payer: Self-pay | Admitting: Podiatry

## 2016-06-02 ENCOUNTER — Other Ambulatory Visit: Payer: Self-pay

## 2016-06-02 ENCOUNTER — Ambulatory Visit (INDEPENDENT_AMBULATORY_CARE_PROVIDER_SITE_OTHER): Payer: Medicare Other

## 2016-06-02 VITALS — BP 147/78 | HR 72 | Resp 14

## 2016-06-02 DIAGNOSIS — L98499 Non-pressure chronic ulcer of skin of other sites with unspecified severity: Secondary | ICD-10-CM

## 2016-06-02 DIAGNOSIS — E11622 Type 2 diabetes mellitus with other skin ulcer: Secondary | ICD-10-CM

## 2016-06-02 DIAGNOSIS — R52 Pain, unspecified: Secondary | ICD-10-CM

## 2016-06-02 NOTE — Patient Outreach (Signed)
Care Coordination: Patient returned call. States that her niece assisted her in starting her insulin about 3 days ago. Reports CBG reading on 1/29 am was 147,  Pm reading of 120.  Reports today's fasting CBG was 112.   States that she feels okay.  PLAN: offered to come to home for home visit with myself and [facility] pharmacy to reviewed insulin administration and patient agrees to home visit,  In basket message sent to [facility] pharmacist to confirm pending appointment for tomorrow.  Tomasa Rand, RN, BSN, CEN [facility] [employer] [phone]

## 2016-06-02 NOTE — Progress Notes (Signed)
Patient ID: [name], female   DOB: [birth year], 83 y.o.   MRN: [id number]

## 2016-06-02 NOTE — Patient Outreach (Signed)
Care Coordination: Received voicemail from patient stating that she has her insulin.   Discussed visit options with [facility] pharmacist Joseph Berkshire.    Placed call to patient to arrange home visit. No answer. Left a message requesting a call back.  Tomasa Rand, RN, BSN, CEN [facility] [employer] [phone]

## 2016-06-02 NOTE — Patient Instructions (Signed)
apply Silvadene cream to the skin ulcer on the second left toe daily and cover with gauze Wear the surgical shoe on the left foot Wear the toe prop on the toes are your left foot If you notice any sudden increase in pain, swelling, redness, fever presents emergency room  Diabetes and Foot Care Diabetes [place]mon cause you to have problems because of poor blood supply (circulation) to your feet and legs. This [place] cause the skin on your feet to become thinner, break easier, and heal more slowly. Your skin Frech become dry, and the skin Schultes peel and crack. You Micek also have nerve damage in your legs and feet causing decreased feeling in them. You Schmutz not notice minor injuries to your feet that could lead to infections or more serious problems. Taking care of your feet is one of the most important things you can do for yourself. Follow these instructions at home:  Wear shoes at all times, even in the house. Do not go barefoot. Bare feet are easily injured.  Check your feet daily for blisters, cuts, and redness. If you cannot see the bottom of your feet, use a mirror or ask someone for help.  Wash your feet with warm water (do not use hot water) and mild soap. Then pat your feet and the areas between your toes until they are completely dry. Do not soak your feet as this can dry your skin.  Apply a moisturizing lotion or petroleum jelly (that does not contain alcohol and is unscented) to the skin on your feet and to dry, brittle toenails. Do not apply lotion between your toes.  Trim your toenails straight across. Do not dig under them or around the cuticle. File the edges of your nails with an emery board or nail file.  Do not cut corns or calluses or try to remove them with medicine.  Wear clean socks or stockings every day. Make sure they are not too tight. Do not wear knee-high stockings since they [place] decrease blood flow to your legs.  Wear shoes that fit properly and have enough cushioning. To break  in new shoes, wear them for just a few hours a day. This prevents you from injuring your feet. Always look in your shoes before you put them on to be sure there are no objects inside.  Do not cross your legs. This [place] decrease the blood flow to your feet.  If you find a minor scrape, cut, or break in the skin on your feet, keep it and the skin around it clean and dry. These areas M[place] be cleansed with mild soap and water. Do not cleanse the area with peroxide, alcohol, or iodine.  When you remove an adhesive bandage, be sure not to damage the skin around it.  If you have a wound, look at it several times a day to make sure it is healing.  Do not use heating pads or hot water bottles. They Sozio burn your skin. If you have lost feeling in your feet or legs, you Zarrella not know it is happening until it is too late.  Make sure your health care provider performs a complete foot exam at least annually or more often if you have foot problems. Report any cuts, sores, or bruises to your health care provider immediately. Contact a health care provider if:  You have an injury that is not healing.  You have cuts or breaks in the skin.  You have an ingrown nail.  You notice redness on your legs or feet.  You feel burning or tingling in your legs or feet.  You have pain or cramps in your legs and feet.  Your legs or feet are numb.  Your feet always feel cold. Get help right away if:  There is increasing redness, swelling, or pain in or around a wound.  There is a red line that goes up your leg.  Pus is coming from a wound.  You develop a fever or as directed by your health care provider.  You notice a bad smell coming from an ulcer or wound. This information is not intended to replace advice given to you by your health care provider. Make sure you discuss any questions you have with your health care provider. Document Released: 04/17/2000 Document Revised: 09/26/2015 Document Reviewed:  09/27/2012 Elsevier Interactive Patient Education  2017 [employer].

## 2016-06-02 NOTE — Progress Notes (Signed)
Patient ID: [name], female   DOB: 10-23-1929, 83 y.o.   MRN: [id number]    Subjective: This patient presents for follow-up care for ulcers cellulitis onset symptoms approximately 05/05/2016 with initial office visit on 05/12/2016. Patient was able to tolerate 6 of 7 days of Bactrim DS prescribed initially on the visit of 05/12/2016. On the visit of 05/19/2016 patient was instructed to allow the nausea vomiting to resolve and restart the Bactrim DS. Patient was able to tolerate an additional 3 days of Bactrim DS for a total of 9 days. At this time patient is nausea is resolving. Patient states today on the visit of 06/02/2016 and she was able to complete all remaining doses of Bactrim DS for a total of 14 doses. The last several days patient describes minimal nausea   Objective: Orientated 3 No peripheral edema or calf edema or calf tenderness bilaterally DP and PT pulses 2/4 bilaterally Capillary reflex immediate bilaterally  Neurological: Sensation to 10 g monofilament wire intact 1/5 right 0/5 left Vibratory sensation nonreactive bilaterally Ankle reflex equal and reactive bilaterally  Dermatological: Erythematous edematous distal second left toe with a 3 mm superficial ulcer. There is no active drainage. There scaling over the distal second left toe and after debridement reveals a granular ulcer base. The erythema has receded from the initial visit of 05/12/2016 and now is localized to the distal toe. There is no active drainage, warmth, malodor. Low-grade edema distal second left toe reducing from initial visit of May 22, 2016  Musculoskeletal: Second left toe is contracted and rigid at the PIPJ  Study Result     Intact bony structures fracture and/or dislocation There are no cortical disruptions noted in the distal second left toe No gas formation noted  Radiographic impression: No x-ray signs of osteomyelitis/or bone activity in the distal aspect second left toe     Assessment: Resolving ulceration cellulitis second toe left foot  Plan: Debrided ulcer apply Silvadene cream Continue wearing toe prop on left foot Continue wearing surgical shoe As patient has had extreme difficulty tolerating oral antibiotics and clinically the symptoms are improving I will reevaluate 7 days to determine if any further antibiotics are prescribed Discussed possibility of possible toe amputation with patient Patient instructed if she knows any sudden increase in pain, swelling, redness, fever to present to the emergency department  Appointment 7 days

## 2016-06-03 ENCOUNTER — Other Ambulatory Visit: Payer: Self-pay | Admitting: Pharmacist

## 2016-06-03 ENCOUNTER — Other Ambulatory Visit: Payer: Self-pay

## 2016-06-03 NOTE — Patient Outreach (Signed)
[facility]) Care Management   06/03/2016  [name] [birth year] [id number]  Kimla Furth Mcginnity is an 83 y.o. female Acute home visit with Joseph Berkshire to teach insulin administration.  Subjective: Patient reports that CBG levels are much better since she started taking her insulin 4 days ago. Today of 95. Ate a doughnut last night for dinner/bedtime snacks.  Reports that she usually does not eat dinner at night.  Reports that she saw "foot doctor" yesterday and foot is getting better.  Reports that she will go back to foot doctor in 1 week.    CBG readings per home log:  06/03/2016   95                                                     06/02/2016  275 (evening)  112  (am)                                                     06/01/2016  120 ( evening)    147  (am)                                                     05/31/2016   180(evening)    187  ( am)                                                     05/30/2016    86  (evening)                                                     05/29/2016   263  ( evening)   189  ( am)                                                     05/28/2016   297   (evening)    197  ( am)   Denies any problems today. No pain.    Objective:  Awake and alert.  Ambulating without difficulty.  Vitals:   06/03/16 1306  BP: 120/62  Pulse: 82  Resp: 16  SpO2: 98%   Review of Systems  Constitutional: Negative.   HENT: Negative.   Eyes: Negative.   Respiratory: Negative.   Cardiovascular: Negative.   Gastrointestinal: Negative.   Genitourinary: Negative.   Musculoskeletal: Negative.   Skin:       Wound on the left foot.   Neurological: Negative.   Endo/Heme/Allergies: Negative.   Psychiatric/Behavioral: Negative.     Physical Exam  Constitutional: She is  oriented to person, place, and time.  Cardiovascular: Normal rate.   Respiratory: Effort normal.  Neurological: She is alert and oriented to person, place, and time.  Skin:  Wearing wooden shoe  to the left foot.   Psychiatric: She has a normal mood and affect. Her behavior is normal. Judgment and thought content normal.    Encounter Medications:   Outpatient Encounter Prescriptions as of 06/03/2016  Medication Sig Note  . acetaminophen (TYLENOL) 500 MG tablet Take 1,000 mg by mouth every 6 (six) hours as needed for pain.   Marland Kitchen ALPRAZolam (XANAX) 0.25 MG tablet Take 1 tablet (0.25 mg total) by mouth at bedtime.   . Biotin 1000 MCG tablet Take 1,000 mcg by mouth 3 (three) times a week.   . Blood Glucose Monitoring Suppl (PRODIGY VOICE BLOOD GLUCOSE) w/Device KIT Use to check blood sugar 1 time per day.   . Bromocriptine Mesylate 0.8 MG TABS Take 1 tablet (0.8 mg total) by mouth daily. (Patient not taking: Reported on 05/27/2016)   . calcium carbonate (OS-CAL) 600 MG TABS Take 600 mg by mouth daily. Reported on 09/03/2015   . cholecalciferol (VITAMIN D) 1000 units tablet Take 600 Units by mouth daily.    . Cholecalciferol 5000 units capsule Take 5,000 Units by mouth daily.   . colestipol (COLESTID) 1 G tablet Take 1-2 g by mouth 3 (three) times daily. Take 2 tablets in the morning, 1 tablet at noon, and 2 tablets in the evening   . cyanocobalamin (,VITAMIN B-12,) 1000 MCG/ML injection INJECT 1 ML INTRAMUSCULARLY EVERY 21 DAYS   . escitalopram (LEXAPRO) 10 MG tablet Take 10 mg by mouth daily.   Marland Kitchen escitalopram (LEXAPRO) 5 MG tablet Take 1 tablet by mouth daily. Reported on 09/03/2015 08/19/2015: Pt stopped taking this past week  . Ferrous Sulfate 142 (45 Fe) MG TBCR Take 1 tablet by mouth daily.   . fexofenadine (ALLEGRA) 60 MG tablet Take 60 mg by mouth daily as needed for allergies.    . furosemide (LASIX) 20 MG tablet Take 1 tablet (20 mg total) by mouth daily. Do not take until I call you about labs. (Patient not taking: Reported on 05/27/2016) 07/19/2014: .  . glucose blood test strip Use to check blood sugar 1 time per day.   . Insulin Glargine (BASAGLAR KWIKPEN) 100 UNIT/ML SOPN Inject 10  Units into the skin daily.   Marland Kitchen levothyroxine (SYNTHROID, LEVOTHROID) 50 MCG tablet Take 1 tablet (50 mcg total) by mouth daily before breakfast.   . lidocaine (LIDODERM) 5 % Place 1 patch onto the skin daily. Remove & Discard patch within 12 hours or as directed by MD (Patient taking differently: Place 1 patch onto the skin daily as needed (pain). Remove & Discard patch within 12 hours or as directed by MD)   . metFORMIN (GLUCOPHAGE-XR) 500 MG 24 hr tablet Take 1 tablet (500 mg total) by mouth daily with breakfast. (Patient taking differently: Take 1,000 mg by mouth at bedtime. )   . nateglinide (STARLIX) 60 MG tablet Take 120 mg by mouth 3 (three) times daily with meals. 2 tablets 3 times per day   . OVER THE COUNTER MEDICATION Take 1 capsule by mouth daily. Recall Max memory supplement   . phenytoin (DILANTIN) 100 MG ER capsule One capsule in the morning and two at night   . PHENYTOIN INFATABS 50 MG tablet CHEW 1 TABLET DAILY   . Polyethyl Glycol-Propyl Glycol 0.4-0.3 % SOLN Place 2 drops into both eyes at  bedtime.    Marland Kitchen PRANDIN 2 MG tablet Take 1 tablet (2 mg total) by mouth 3 (three) times daily before meals. (Patient not taking: Reported on 05/27/2016)   . ranitidine (ZANTAC) 150 MG capsule Take 150 mg by mouth daily as needed for heartburn.    . silver sulfADIAZINE (SILVADENE) 1 % cream Apply 1 application topically daily. (Patient not taking: Reported on 05/27/2016)   . sulfamethoxazole-trimethoprim (BACTRIM DS) 800-160 MG tablet Take 1 tablet by mouth 2 (two) times daily.   Marland Kitchen TARKA 2-180 MG per tablet TAKE 1 TABLET DAILY   . traMADol (ULTRAM) 50 MG tablet Take 50 mg by mouth every 6 (six) hours as needed.     No facility-administered encounter medications on file as of 06/03/2016.     Functional Status:   In your present state of health, do you have any difficulty performing the following activities: 05/27/2016  Hearing? N  Vision? Y  Difficulty concentrating or making decisions? N   Walking or climbing stairs? Y  Dressing or bathing? N  Doing errands, shopping? Y  Preparing Food and eating ? Y  Using the Toilet? N  In the past six months, have you accidently leaked urine? N  Do you have problems with loss of bowel control? N  Managing your Medications? Y  Managing your Finances? N  Housekeeping or managing your Housekeeping? Y  Some recent data might be hidden    Fall/Depression Screening:    PHQ 2/9 Scores 05/27/2016 05/13/2016 02/06/2013 11/19/2011  PHQ - 2 Score 0 0 0 0    Assessment:   (1) medications reviewed by pharmacy. (2) CBG today of 95 (3) follow up with primary MD on 06/08/2016. (4) not eating 3 meals and a snack. (5) questions about how to apply insulin needle.   Plan:  (1) see pharmacy note. (2) Encouraged patient to eat a snack at bedtime.  Reviewed importance of proteins (3) Encouraged patient to take her CBG log with her to MD appointment. (4) Again reviewed DM diet.   (5) Reviewed how to changes needles, reviewed proper technique of taking insulin and prepping pen.  Reviewed low blood sugars signs and symptoms and how to treat. Encouraged patient to get soda, OJ and some candy to have on hand for hypoglycemic symptoms.  Encouraged peanut butter sandwich at bedtime. EMMI education provided about insulin pens, rotation of sites.   Next follow up planned via phone in 2 weeks. Reviewed this plan with patient and she is in agreement to call sooner if needed.   THN CM Care Plan Problem One   Flowsheet Row Most Recent Value  Care Plan Problem One  Patient reports DM uncontrolled with a new sore on left 2nd toe.   Role Documenting the Problem One  Care Management [place] for Problem One  Active  THN Long Term Goal (31-90 days)  Patient will report improved CBG readings in the next 45 days.  THN Long Term Goal Start Date  05/27/16  Interventions for Problem One Long Term Goal  Reviewed insulin administration, changing needles.   THN  CM Short Term Goal #1 (0-30 days)  Patient will reports delivery of insulin in the next 7 days.  THN CM Short Term Goal #1 Start Date  05/27/16  [facility] CM Short Term Goal #1 Met Date  06/03/16  Interventions for Short Term Goal #1  reviewed importance of patient calling me when insulin arrives. [facility] call [facility]. Will inquire from MD dose  of insulin.  THN CM Short Term Goal #2 (0-30 days)  Patient will verbalize feeling comfortable giving herself insulin in the next 30 days.  THN CM Short Term Goal #2 Start Date  05/27/16  Interventions for Short Term Goal #2  Provided education about insulin administration.  Patient able to confirm feeling comfortable with self administration. Will continue to follow.       Tomasa Rand, RN, BSN, CEN [facility] [employer] [phone]

## 2016-06-03 NOTE — Patient Outreach (Addendum)
[facility]) Care Management  [place]   06/03/16  [name] 01-Jun-1929 [id number]  Subjective: 83 year old female referred to [place] for medication management as patient has been started on basaglar insulin requiring administration instruction and patient also has old expired medications which she does not want to depart with.  [facility] pharmacist home visit performed today with assistance of [facility] RN.   Patient Denies seizures.   Stats she is Able to tolerate metformin XR 1000 mg daily.   Patient states that [facility] sends generic Starlix and that it does not work. She refuses to take generic medications and is currently taking an expired Starlix which she refuses to discard even though she currently has the generic that is in date.   Patient is taking Basaglar insulin in the evening, which she started last Friday. Her niece taught her how to use the insulin pen. When asked to demonstrate using her insulin pen, she dialed the pen up to 11. Patient likes that the insulin has brought her sugars down.   Patient says that if her BG is low, she eats something. Counseled patient on better options for hypoglycemia management such as hard candy or regular sugar if she sees a BG < 70.   Patient reports that she does not eat supper. She only eats a snack before bedtime (such as a doughnut).  Breakfast: Cereal, cup of coffee, doughnut, bacon, eggs, grits.  CBG readings per home log:  06/03/2016   95                                                     06/02/2016  275 (evening)  112  (am)                                                     06/01/2016  120 ( evening)    147  (am)                                                     05/31/2016   180(evening)    187  ( am)                                                     05/30/2016    86  (evening)                                                     05/29/2016   263  ( evening)   189  ( am)                    05/28/2016   297   (evening)    197  (  am)  Objective:   Encounter Medications: Outpatient Encounter Prescriptions as of 06/03/2016  Medication Sig Note  . acetaminophen (TYLENOL) 500 MG tablet Take 1,000 mg by mouth every 6 (six) hours as needed for pain.   Marland Kitchen ALPRAZolam (XANAX) 0.25 MG tablet Take 1 tablet (0.25 mg total) by mouth at bedtime.   . Biotin 1000 MCG tablet Take 1,000 mcg by mouth 3 (three) times a week.   . Blood Glucose Monitoring Suppl (PRODIGY VOICE BLOOD GLUCOSE) w/Device KIT Use to check blood sugar 1 time per day.   . Bromocriptine Mesylate 0.8 MG TABS Take 1 tablet (0.8 mg total) by mouth daily. (Patient not taking: Reported on 05/27/2016)   . calcium carbonate (OS-CAL) 600 MG TABS Take 600 mg by mouth daily. Reported on 09/03/2015   . cholecalciferol (VITAMIN D) 1000 units tablet Take 600 Units by mouth daily.    . Cholecalciferol 5000 units capsule Take 5,000 Units by mouth daily.   . colestipol (COLESTID) 1 G tablet Take 1-2 g by mouth 3 (three) times daily. Take 2 tablets in the morning, 1 tablet at noon, and 2 tablets in the evening   . cyanocobalamin (,VITAMIN B-12,) 1000 MCG/ML injection INJECT 1 ML INTRAMUSCULARLY EVERY 21 DAYS   . escitalopram (LEXAPRO) 10 MG tablet Take 10 mg by mouth daily.   Marland Kitchen escitalopram (LEXAPRO) 5 MG tablet Take 1 tablet by mouth daily. Reported on 09/03/2015 08/19/2015: Pt stopped taking this past week  . Ferrous Sulfate 142 (45 Fe) MG TBCR Take 1 tablet by mouth daily.   . fexofenadine (ALLEGRA) 60 MG tablet Take 60 mg by mouth daily as needed for allergies.    . furosemide (LASIX) 20 MG tablet Take 1 tablet (20 mg total) by mouth daily. Do not take until I call you about labs. (Patient not taking: Reported on 05/27/2016) 07/19/2014: .  . glucose blood test strip Use to check blood sugar 1 time per day.   . levothyroxine (SYNTHROID, LEVOTHROID) 50 MCG tablet Take 1 tablet (50 mcg total) by mouth daily before breakfast.   .  lidocaine (LIDODERM) 5 % Place 1 patch onto the skin daily. Remove & Discard patch within 12 hours or as directed by MD (Patient taking differently: Place 1 patch onto the skin daily as needed (pain). Remove & Discard patch within 12 hours or as directed by MD)   . metFORMIN (GLUCOPHAGE-XR) 500 MG 24 hr tablet Take 1 tablet (500 mg total) by mouth daily with breakfast. 05/27/2016: Takes one tablet at bedtime only.  . nateglinide (STARLIX) 60 MG tablet Take 120 mg by mouth 3 (three) times daily with meals. 2 tablets 3 times per day   . OVER THE COUNTER MEDICATION Take 1 capsule by mouth daily. Recall Max memory supplement   . phenytoin (DILANTIN) 100 MG ER capsule One capsule in the morning and two at night   . PHENYTOIN INFATABS 50 MG tablet CHEW 1 TABLET DAILY   . Polyethyl Glycol-Propyl Glycol 0.4-0.3 % SOLN Place 2 drops into both eyes at bedtime.    Marland Kitchen PRANDIN 2 MG tablet Take 1 tablet (2 mg total) by mouth 3 (three) times daily before meals. (Patient not taking: Reported on 05/27/2016)   . ranitidine (ZANTAC) 150 MG capsule Take 150 mg by mouth daily as needed for heartburn.    . silver sulfADIAZINE (SILVADENE) 1 % cream Apply 1 application topically daily. (Patient not taking: Reported on 05/27/2016)   . sulfamethoxazole-trimethoprim (BACTRIM DS) 800-160 MG tablet Take 1  tablet by mouth 2 (two) times daily.   Marland Kitchen TARKA 2-180 MG per tablet TAKE 1 TABLET DAILY   . traMADol (ULTRAM) 50 MG tablet Take by mouth every 6 (six) hours as needed.    No facility-administered encounter medications on file as of 06/03/2016.     Functional Status: In your present state of health, do you have any difficulty performing the following activities: 05/27/2016  Hearing? N  Vision? Y  Difficulty concentrating or making decisions? N  Walking or climbing stairs? Y  Dressing or bathing? N  Doing errands, shopping? Y  Preparing Food and eating ? Y  Using the Toilet? N  In the past six months, have you accidently  leaked urine? N  Do you have problems with loss of bowel control? N  Managing your Medications? Y  Managing your Finances? N  Housekeeping or managing your Housekeeping? Y  Some recent data might be hidden    Fall/Depression Screening: PHQ 2/9 Scores 05/27/2016 05/13/2016 02/06/2013 11/19/2011  PHQ - 2 Score 0 0 0 0    Assessment: Drugs sorted by system: Neurologic/Psychologic: alprazolam 0.25 mg at bedtime as needed, escitalopram 10 mg daily, phenytoin infatabs 50 mg daily, phenytoin 100 mg every morning and 200 mg at night  Cardiovascular: furosemide 20 mg as needed (medication expired but refuses to discard), Tarka 2/180 mg (trandopril/verapamil) daily   Pulmonary/Allergy:fexofenadine  Gastrointestinal:ranitidine 150 mg daily as needed  Endocrine: colestipol 1-2 g three times daily, levothyroxine 50 mcg daily, Basaglar 10 units daily, metformin XR 1000 mg daily, Starlix 120 mg three times daily (expired but refuses to take her generic supply)  Topical: lidocaine patches  Pain: acetaminophen, tramadol   Vitamins/Minerals: biotin, calcium carbonate (not taking), vitamin D, vitamin b12 injections, ferrous sulfate, recall max supplement  Medications to avoid in the elderly: alprazolam Other issues noted: Patient refuses to discard expired medications and is currently taking expired Starlix. CBGs are improving since starting Basaglar 4 days ago. Patient does not eat dinner at night which [place] put her at risk for hypoglycemia.     Plan: Counseled on signs/symptoms/treatment of hypoglycemia using rule of 15s. Discussed eating protein rich snack instead of a carbohydrate prior to bedtime to help stabilize her blood glucose over time.  Discussed low carbohydrate diet, portion sizes, and reading nutrition labels. Provided patient with "Healthy Meals Diabetes Handout." Discarded some of patients expired medications but patient refused to discard most of her expired medications including her  Starlix which she is currently still taking.  Discussed alternate diabetes medications such as SGLT2 inhibitors including risks vs benefits and patient was open to considering these medications.  Will contact Dr Ernie Hew to make aware of Starlix and to consider transition to SGLT2 inhibitor.  Discussed statins and aspirin but patient states that "she will never try these medications"  [place] will followup in 2 weeks to see if patient needs continued [place], PharmD, [place] PGY2 Pharmacy Resident [phone]

## 2016-06-04 ENCOUNTER — Telehealth: Payer: Self-pay | Admitting: Podiatry

## 2016-06-04 ENCOUNTER — Other Ambulatory Visit: Payer: Self-pay | Admitting: Podiatry

## 2016-06-04 ENCOUNTER — Telehealth: Payer: Self-pay

## 2016-06-04 DIAGNOSIS — E11622 Type 2 diabetes mellitus with other skin ulcer: Secondary | ICD-10-CM

## 2016-06-04 DIAGNOSIS — L98499 Non-pressure chronic ulcer of skin of other sites with unspecified severity: Principal | ICD-10-CM

## 2016-06-04 NOTE — Telephone Encounter (Signed)
Patient called requesting refill on unknown rx, Attempted to call her back, but was unable to leave voice mail.

## 2016-06-04 NOTE — Telephone Encounter (Signed)
Pt called and was told by Dr Amalia Hailey to use silvadene cream and she thought she had some at home but does not. Can we call in a rx [facility] to [facility] [address].She asked if we could due it before 2 pm today so her aid could pick it up

## 2016-06-08 ENCOUNTER — Ambulatory Visit: Payer: Medicare Other | Admitting: Endocrinology

## 2016-06-09 ENCOUNTER — Ambulatory Visit (INDEPENDENT_AMBULATORY_CARE_PROVIDER_SITE_OTHER): Payer: Medicare Other | Admitting: Podiatry

## 2016-06-09 ENCOUNTER — Encounter: Payer: Self-pay | Admitting: Podiatry

## 2016-06-09 VITALS — BP 158/89 | HR 82 | Resp 14

## 2016-06-09 DIAGNOSIS — L84 Corns and callosities: Secondary | ICD-10-CM | POA: Diagnosis not present

## 2016-06-09 DIAGNOSIS — L608 Other nail disorders: Secondary | ICD-10-CM

## 2016-06-09 DIAGNOSIS — E1142 Type 2 diabetes mellitus with diabetic polyneuropathy: Secondary | ICD-10-CM | POA: Diagnosis not present

## 2016-06-09 DIAGNOSIS — Z6821 Body mass index (BMI) 21.0-21.9, adult: Secondary | ICD-10-CM | POA: Diagnosis not present

## 2016-06-09 DIAGNOSIS — E1159 Type 2 diabetes mellitus with other circulatory complications: Secondary | ICD-10-CM | POA: Diagnosis not present

## 2016-06-09 NOTE — Progress Notes (Signed)
Patient ID: [name], female   DOB: 1930/02/01, 83 y.o.   MRN: [id number]    Subjective: This patient presents for follow-up care for ulcers cellulitis onset symptoms approximately 05/05/2016 with initial office visit on 05/12/2016. Patient was able to tolerate 6 of 7 days of Bactrim DS prescribed initially on the visit of 05/12/2016. On the visit of 05/19/2016 patient was instructed to allow the nausea vomiting to resolve and restart the Bactrim DS. Patient was able to tolerate an additional 3 days of Bactrim DS for a total of 9 days. At this time patient is nausea is resolving. Patient states today on the visit of 06/02/2016 and she was able to complete all remaining doses of Bactrim DS for a total of 14 doses. The last several days patient describes minimal nausea No antibiotics in the past 7 days  Objective: Orientated 3 No peripheral edema or calf edema or calf tenderness bilaterally DP and PT pulses 2/4 bilaterally Capillary reflex immediate bilaterally  Neurological: Sensation to 10 g monofilament wire intact 1/5 right 0/5 left Vibratory sensation nonreactive bilaterally Ankle reflex equal and reactive bilaterally  Dermatological: Erythematous edematous distal second left toe with an eschar that remains closed after debridement. There is no active drainage. There scaling over the distal second left toe and after debridement reveals a granular ulcer base. The erythema has receded from the initial visit of 05/12/2016 and now is localized to the distal toe. There is no active drainage, warmth, malodor. Low-grade edema distal second left toe reducing from initial visit of May 22, 2016  Musculoskeletal: Second left toe is contracted and rigid at the PIPJ  Study Result     Intact bony structures fracture and/or dislocation There are no cortical disruptions noted in the distal second left toe No gas formation noted  Radiographic impression: No x-ray signs of  osteomyelitis/or bone activity in the distal aspect second left toe    Assessment: Resolving ulceration cellulitis second toe left foot  Plan:  Continue wearing toe prop on left foot Continue wearing surgical shoe As patient has had extreme difficulty tolerating oral antibiotics and clinically the symptoms are improving I will reevaluate 7 days to determine if any further antibiotics are prescribed Discussed possibility of possible toe amputation with patient Patient instructed if she knows any sudden increase in pain, swelling, redness, fever to present to the emergency department  Appointment 7 days

## 2016-06-09 NOTE — Patient Instructions (Signed)
Wear the surgical shoe on the left foot Wear the toe prop on the toes in the left foot Apply small amount of topical antibiotic ointment around the end of the second If you notice any sudden increase in pain, swelling, redness, drainage, fevers present to the emergency department  Diabetes and Foot Care Diabetes Kluger cause you to have problems because of poor blood supply (circulation) to your feet and legs. This Nixon cause the skin on your feet to become thinner, break easier, and heal more slowly. Your skin Ard become dry, and the skin Howse peel and crack. You Calvi also have nerve damage in your legs and feet causing decreased feeling in them. You Simonis not notice minor injuries to your feet that could lead to infections or more serious problems. Taking care of your feet is one of the most important things you can do for yourself. Follow these instructions at home:  Wear shoes at all times, even in the house. Do not go barefoot. Bare feet are easily injured.  Check your feet daily for blisters, cuts, and redness. If you cannot see the bottom of your feet, use a mirror or ask someone for help.  Wash your feet with warm water (do not use hot water) and mild soap. Then pat your feet and the areas between your toes until they are completely dry. Do not soak your feet as this can dry your skin.  Apply a moisturizing lotion or petroleum jelly (that does not contain alcohol and is unscented) to the skin on your feet and to dry, brittle toenails. Do not apply lotion between your toes.  Trim your toenails straight across. Do not dig under them or around the cuticle. File the edges of your nails with an emery board or nail file.  Do not cut corns or calluses or try to remove them with medicine.  Wear clean socks or stockings every day. Make sure they are not too tight. Do not wear knee-high stockings since they Krueger decrease blood flow to your legs.  Wear shoes that fit properly and have enough cushioning.  To break in new shoes, wear them for just a few hours a day. This prevents you from injuring your feet. Always look in your shoes before you put them on to be sure there are no objects inside.  Do not cross your legs. This Podolski decrease the blood flow to your feet.  If you find a minor scrape, cut, or break in the skin on your feet, keep it and the skin around it clean and dry. These areas Broker be cleansed with mild soap and water. Do not cleanse the area with peroxide, alcohol, or iodine.  When you remove an adhesive bandage, be sure not to damage the skin around it.  If you have a wound, look at it several times a day to make sure it is healing.  Do not use heating pads or hot water bottles. They Hinson burn your skin. If you have lost feeling in your feet or legs, you Bonus not know it is happening until it is too late.  Make sure your health care provider performs a complete foot exam at least annually or more often if you have foot problems. Report any cuts, sores, or bruises to your health care provider immediately. Contact a health care provider if:  You have an injury that is not healing.  You have cuts or breaks in the skin.  You have an ingrown nail.  You notice  redness on your legs or feet.  You feel burning or tingling in your legs or feet.  You have pain or cramps in your legs and feet.  Your legs or feet are numb.  Your feet always feel cold. Get help right away if:  There is increasing redness, swelling, or pain in or around a wound.  There is a red line that goes up your leg.  Pus is coming from a wound.  You develop a fever or as directed by your health care provider.  You notice a bad smell coming from an ulcer or wound. This information is not intended to replace advice given to you by your health care provider. Make sure you discuss any questions you have with your health care provider. Document Released: 04/17/2000 Document Revised: 09/26/2015 Document  Reviewed: 09/27/2012 Elsevier Interactive Patient Education  2017 [employer].

## 2016-06-14 ENCOUNTER — Other Ambulatory Visit: Payer: Self-pay | Admitting: Endocrinology

## 2016-06-16 ENCOUNTER — Ambulatory Visit (INDEPENDENT_AMBULATORY_CARE_PROVIDER_SITE_OTHER): Payer: Medicare Other | Admitting: Podiatry

## 2016-06-16 ENCOUNTER — Encounter: Payer: Self-pay | Admitting: Podiatry

## 2016-06-16 VITALS — BP 135/75 | HR 83

## 2016-06-16 DIAGNOSIS — L84 Corns and callosities: Secondary | ICD-10-CM

## 2016-06-16 DIAGNOSIS — E1142 Type 2 diabetes mellitus with diabetic polyneuropathy: Secondary | ICD-10-CM | POA: Diagnosis not present

## 2016-06-16 NOTE — Progress Notes (Signed)
Patient ID: [name], female   DOB: May 12, 1929, 83 y.o.   MRN: [id number]    Subjective: This patient presents for follow-up care for ulcers cellulitis onset symptoms approximately 05/05/2016 with initial office visit on 05/12/2016. Patient was able to tolerate 6 of 7 days of Bactrim DS prescribed initially on the visit of 05/12/2016. On the visit of 05/19/2016 patient was instructed to allow the nausea vomiting to resolve and restart the Bactrim DS. Patient was able to tolerate an additional 3 days of Bactrim DS for a total of 9 days. At this time patient is nausea is resolving.Patient states today on the visit of 06/02/2016 and she was able to complete all remaining doses of Bactrim DS for a total of 14 doses. The last several days patient describes minimal nausea No antibiotics in the past  14 days  Objective: Orientated 3 No peripheral edema or calf edema or calf tenderness bilaterally DP and PT pulses 2/4 bilaterally Capillary reflex immediate bilaterally  Neurological: Sensation to 10 g monofilament wire intact 1/5 right 0/5 left Vibratory sensation nonreactive bilaterally Ankle reflex equal and reactive bilaterally  Dermatological: Minimal Erythematous edematous distal second left toe with an eschar that remains closed after debridement. There is no active drainage. There scaling over the distal second left toe and after debridement reveals a granular ulcer base. The erythema has receded from the initial visit of 05/12/2016 and now is localized to the distal toe. There is no active drainage, warmth, malodor.Low-grade edema distal second left toe reducing from initial visit of May 22, 2016, continuous improvement  Musculoskeletal: Second left toe is contracted and rigid at the PIPJ  Study Result     Intact bony structures fracture and/or dislocation There are no cortical disruptions noted in the distal second left toe No gas formation noted  Radiographic  impression: No x-ray signs of osteomyelitis/or bone activity in the distal aspect second left toe    Assessment: Resolving ulceration cellulitis second toe left foot  Plan:  Continue wearing toe prop on left foot Dispensed an additional silicone toe prop Continue wearing surgical shoe  Reappoint 3 weeks

## 2016-06-16 NOTE — Patient Instructions (Signed)
Wear the surgical shoe on the left foot Wear the toe prop on the left toes If you notice any sudden increase in pain, swelling, redness, fever, warmth, drainage present to the emergency department  Diabetes and Foot Care Diabetes Bordelon cause you to have problems because of poor blood supply (circulation) to your feet and legs. This Pellegrini cause the skin on your feet to become thinner, break easier, and heal more slowly. Your skin Speegle become dry, and the skin Salinas peel and crack. You Mula also have nerve damage in your legs and feet causing decreased feeling in them. You Padia not notice minor injuries to your feet that could lead to infections or more serious problems. Taking care of your feet is one of the most important things you can do for yourself. Follow these instructions at home:  Wear shoes at all times, even in the house. Do not go barefoot. Bare feet are easily injured.  Check your feet daily for blisters, cuts, and redness. If you cannot see the bottom of your feet, use a mirror or ask someone for help.  Wash your feet with warm water (do not use hot water) and mild soap. Then pat your feet and the areas between your toes until they are completely dry. Do not soak your feet as this can dry your skin.  Apply a moisturizing lotion or petroleum jelly (that does not contain alcohol and is unscented) to the skin on your feet and to dry, brittle toenails. Do not apply lotion between your toes.  Trim your toenails straight across. Do not dig under them or around the cuticle. File the edges of your nails with an emery board or nail file.  Do not cut corns or calluses or try to remove them with medicine.  Wear clean socks or stockings every day. Make sure they are not too tight. Do not wear knee-high stockings since they Doering decrease blood flow to your legs.  Wear shoes that fit properly and have enough cushioning. To break in new shoes, wear them for just a few hours a day. This prevents you from  injuring your feet. Always look in your shoes before you put them on to be sure there are no objects inside.  Do not cross your legs. This Rusconi decrease the blood flow to your feet.  If you find a minor scrape, cut, or break in the skin on your feet, keep it and the skin around it clean and dry. These areas Scorsone be cleansed with mild soap and water. Do not cleanse the area with peroxide, alcohol, or iodine.  When you remove an adhesive bandage, be sure not to damage the skin around it.  If you have a wound, look at it several times a day to make sure it is healing.  Do not use heating pads or hot water bottles. They Sweeney burn your skin. If you have lost feeling in your feet or legs, you Chilson not know it is happening until it is too late.  Make sure your health care provider performs a complete foot exam at least annually or more often if you have foot problems. Report any cuts, sores, or bruises to your health care provider immediately. Contact a health care provider if:  You have an injury that is not healing.  You have cuts or breaks in the skin.  You have an ingrown nail.  You notice redness on your legs or feet.  You feel burning or tingling in your legs  or feet.  You have pain or cramps in your legs and feet.  Your legs or feet are numb.  Your feet always feel cold. Get help right away if:  There is increasing redness, swelling, or pain in or around a wound.  There is a red line that goes up your leg.  Pus is coming from a wound.  You develop a fever or as directed by your health care provider.  You notice a bad smell coming from an ulcer or wound. This information is not intended to replace advice given to you by your health care provider. Make sure you discuss any questions you have with your health care provider. Document Released: 04/17/2000 Document Revised: 09/26/2015 Document Reviewed: 09/27/2012 Elsevier Interactive Patient Education  2017 [employer].

## 2016-06-17 ENCOUNTER — Other Ambulatory Visit: Payer: Self-pay

## 2016-06-17 ENCOUNTER — Other Ambulatory Visit: Payer: Self-pay | Admitting: Pharmacist

## 2016-06-17 NOTE — Patient Outreach (Addendum)
[facility]) Care Management  06/17/2016  [name] 16-Nov-1929 [id number]  83 year old female referred to [place] for medication management.  Received call today from [place] stating patient's insulin was increased to Basaglar 15 units daily but patient has been unable to check her CBGs.  At last home visit patient had some borderline low CBGs on 10 units daily so this Scheirer increase her risk of hypoglycemia.  Called patient who denies signs/symptoms of hypoglycemia but is unable to check her CBGs.  She states she cannot remember the company of which she receives her diabetic testing supplies.  Called Dr Theone Murdoch office and they state they received a prior authorization from [place] and someone from their office was following that up but that person is not in the office today.  I have asked Dr Theone Murdoch office to send a prescription to [name] on [address] to see if the prescription can be approved by [employer] until the mail order can be resolved.  Plan: Followup via telephone tomorrow.  Bennye Alm, PharmD, BCPS [facility] PGY2 Pharmacy Resident [phone]   Addendum: [facility] pharmacy and the test strip prescription is requiring a prior authorization as Freestyle or Precision meter is preferred.  Will send fax to [facility] office for [facility] strips and meter.

## 2016-06-17 NOTE — Patient Outreach (Signed)
Follow up telephone assessment; Placed call to patient who reports that she is doing okay. States that she is out of test strips and has been out for a week. Reports unable to monitor CBG for 1 week. Reports that Dr. Ernie Hew increased insulin to 15 units daily.  Patient reports that she does not know if she has had any lows. Patient reports no problems giving herself insulin.  States that it is "easier than she thought"  Patient reports that she has no idea where her strip prescription was sent or when she will get any strips.  States the company that she use to use went out of business. Reports that she saw podiatrist yesterday and has to continue to wear her surgical shoe.  Reports toe is healing slowly.    Denies any other new problems or concerns.   PLAN: Placed call to [place] to see if he had any idea of where prescription was sent.  Joseph Berkshire states that he will follow up with MD.  I reviewed with patient that I would call her back or have the pharmacist call her back. Reminded patient of the importance of eating regular meals and a snack.   Will follow up when information is gathered.  Tomasa Rand, RN, BSN, CEN [facility] [employer] [phone]

## 2016-06-18 ENCOUNTER — Other Ambulatory Visit: Payer: Self-pay | Admitting: Pharmacist

## 2016-06-18 ENCOUNTER — Other Ambulatory Visit: Payer: Self-pay | Admitting: Internal Medicine

## 2016-06-18 NOTE — Patient Outreach (Signed)
[facility]) Care Management  06/18/2016  [name] [birth year] [id number]  83 year old female referred to [place] for medication management.  Patients Basaglar was recently increased to 15 units daily by Dr Ernie Hew but patient is out of her Prodigy CBG test strips. Dr Ernie Hew sent the prescription to [place] for the [facility] but [facility] stated yesterday that the prescription would require a prior authorization as Freestyle or Precision meter is preferred.  Today I called [place] and asked them to run a test claim for a Freestyle meter but they state this was not covered by her [facility] either.  Called patient and she denies signs/symptoms of hypoglycemia.  She states she called Medicare and found a company "[employer] [employer]" which stated they could provide a meter for her.  She states she called Dr Ival Bible office and asked them to send a prescription for strips to this company.    Plan: Will notify [facility] RN that we are waiting for the meter to be shipped from the diabetic supply company Will followup with patient next week to see if she has received notification the meter has been shipped  Bennye Alm, PharmD, [place] PGY2 Pharmacy Resident [phone]

## 2016-06-21 ENCOUNTER — Ambulatory Visit: Payer: Self-pay | Admitting: Pharmacist

## 2016-06-23 ENCOUNTER — Other Ambulatory Visit: Payer: Self-pay | Admitting: Pharmacist

## 2016-06-23 NOTE — Patient Outreach (Addendum)
[facility]) Care Management  06/23/2016  [name] Oct 11, 1929 [id number]  83 year old female referred to [place] for medication management. Called patient today to discuss if she had heard anything from the mail order pharmacy about her test strips as she is at high risk for hypoglycemia.  Was unable to reach patient via telephone and have left HIPAA compliant voicemail asking her to return my call.  Plan: Will followup in 1-2 days via telephone  Bennye Alm, PharmD, [place] PGY2 Pharmacy Resident [phone]   Addendum: Received call from patient who states she has not received call from [facility] regarding her diabetic testing supplies.  She denies dizziness, sweating, blurred vision but is not sure if she has had any hypoglycemic episodes.  Called [facility] supply and they state the Prodigy strips are backordered.  I have asked them to switch the meter/strips to TrueMetrix and they state the order will be sent today or tomorrow.  Plan: Home visit tomorrow to provide patient with [facility] TrueMetrix meter and 10 CBG test strips.  Will educate patient on how to use meter during visit.  Bennye Alm, PharmD, [place] PGY2 Pharmacy Resident [phone]

## 2016-06-24 ENCOUNTER — Ambulatory Visit: Payer: Medicare Other | Admitting: Pharmacist

## 2016-06-24 ENCOUNTER — Other Ambulatory Visit: Payer: Self-pay | Admitting: Pharmacist

## 2016-06-24 NOTE — Patient Outreach (Signed)
[facility]) Care Management  [place]   06/24/2016  [name] Jun 02, 1929 [id number]  Subjective: 83 year old female referred to [place] for medication management.  Pharmacy home visit today to take patient TrueMetrix meter with 10 test strips until her new CBG meter arrives in the mail.    Patient denies signs/symptoms of hypoglycemia despite not being able to check her CBGs.  Patient CBG today 155 mg/dL with new meter.    Plan: Delivered TrueMetrix meter + 10 test strips.  Instructed patient on how to use new meter and strips.   Counseled on signs/symptoms/mangement of hypoglycemia. Will followup patient CBGs in 1 week and [place] will likely sign off at this point if patient has received new CBG meter  Bennye Alm, PharmD, [place] PGY2 Pharmacy Resident [phone]

## 2016-06-30 ENCOUNTER — Telehealth: Payer: Self-pay | Admitting: *Deleted

## 2016-06-30 NOTE — Telephone Encounter (Addendum)
[name] - Dr. Anna Genre, asked if pt is on blood thinner. I reviewed list and did not see any listed, and informed [name]. 07/01/2016-[name] - Dr. Anna Genre office states Dr. Trenton Gammon would like a copy of pt's last medication list pt states she gave to our office. I told her I would have one of the clerical staff email. [name] states email to [email or web address]

## 2016-07-01 ENCOUNTER — Other Ambulatory Visit: Payer: Self-pay | Admitting: Pharmacist

## 2016-07-01 NOTE — Patient Outreach (Addendum)
[place] [facility]) Care Management  07/01/2016  [name] 1930/05/02 [id number]  Subjective:  83 year old female referred to [place] for medication management.  Called today to followup patient CBGs after providing patient with TrueMetrix blood glucose meter + 10 strips last week until she could receive her strips from [facility].  Patient reported fasting CBGs ranging from 28 mg/dL to 89 mg/dL and states she has one strip left.  She reports with hypoglycemic episode she had signs/symptoms of weakness/shakiness and treated with toast and jelly.  Patient reports she is currently taking Basaglar 15 units daily except for yesterday she took 10 units daily due to having dental surgery with decreased food intake yesterday limited .  Patient does not like taking generic Starlix but has been taking it.    [facility] pharmacist home visit was completed following telephone call to provide patient with more test strips until strips arrive in the mail. Patients reported CBG was incorrect as patient was reading meter upside down.    CBG today when arrived in patients home (1 hour post lunch): 234 mg/dL   Fasting CBG Values:  2/27 620 AM 50 mg/dL 2/26 743 AM 80 mg/dL 2/25 655 AM 64 mg/dL 2/25 652 AM 53 mg/dL  2/24 742 AM 82 mg/dL 2/23 708 AM 77 mg/dL  2/22 619 AM 68 mg/dL 2/21 1103 AM 155 mg/dL  Diet: States she has to have something sweet after she eats and is not interested in stopping Exercise: Exercise bike 30 minutes/day for 2-3 days per week.    Objective:  02/11/16 A1C 7.3 Outpatient Encounter Prescriptions as of 07/01/2016  Medication Sig  . acetaminophen (TYLENOL) 500 MG tablet Take 1,000 mg by mouth every 6 (six) hours as needed for pain.  Marland Kitchen ALPRAZolam (XANAX) 0.25 MG tablet Take 1 tablet (0.25 mg total) by mouth at bedtime. (Patient taking differently: Take 0.25 mg by mouth at bedtime as needed. )  . Cholecalciferol 5000 units capsule Take 5,000 Units by mouth daily.  .  colestipol (COLESTID) 1 G tablet Take 1-2 g by mouth 3 (three) times daily. Take 2 tablets in the morning, 1 tablet at noon, and 2 tablets in the evening  . cyanocobalamin (,VITAMIN B-12,) 1000 MCG/ML injection INJECT 1 ML INTRAMUSCULARLY EVERY 21 DAYS  . doxycycline (VIBRAMYCIN) 100 MG capsule Take 100 mg by mouth 2 (two) times daily.  Marland Kitchen escitalopram (LEXAPRO) 10 MG tablet Take 10 mg by mouth daily.  . ferrous sulfate 325 (65 FE) MG tablet Take 325 mg by mouth every other day.  . fexofenadine (ALLEGRA) 60 MG tablet Take 60 mg by mouth daily as needed for allergies.   . furosemide (LASIX) 20 MG tablet Take 1 tablet (20 mg total) by mouth daily. Do not take until I call you about labs. (Patient taking differently: Take 20 mg by mouth as needed. Do not take until I call you about labs.)  . Insulin Glargine (BASAGLAR KWIKPEN) 100 UNIT/ML SOPN Inject 15 Units into the skin daily.   Marland Kitchen levothyroxine (SYNTHROID, LEVOTHROID) 50 MCG tablet TAKE 1 TABLET DAILY BEFORE BREAKFAST  . lidocaine (LIDODERM) 5 % Place 1 patch onto the skin daily. Remove & Discard patch within 12 hours or as directed by MD (Patient taking differently: Place 1 patch onto the skin daily as needed (pain). Remove & Discard patch within 12 hours or as directed by MD)  . metFORMIN (GLUCOPHAGE-XR) 500 MG 24 hr tablet Take 1 tablet (500 mg total) by mouth daily  with breakfast. (Patient taking differently: Take 500 mg by mouth at bedtime. )  . nateglinide (STARLIX) 60 MG tablet Take 120 mg by mouth 3 (three) times daily with meals. 2 tablets 3 times per day  . OVER THE COUNTER MEDICATION Take 1 capsule by mouth daily. Recall Max memory supplement  . phenytoin (DILANTIN) 100 MG ER capsule One capsule in the morning and two at night  . PHENYTOIN INFATABS 50 MG tablet CHEW 1 TABLET DAILY  . Polyethyl Glycol-Propyl Glycol 0.4-0.3 % SOLN Place 2 drops into both eyes at bedtime.   . ranitidine (ZANTAC) 150 MG capsule Take 150 mg by mouth daily as  needed for heartburn.   . SSD 1 % cream APPLY ONE APPLICATION TOPICALLY DAILY  . TARKA 2-180 MG per tablet TAKE 1 TABLET DAILY  . traMADol (ULTRAM) 50 MG tablet Take 50 mg by mouth every 6 (six) hours as needed.   . Biotin 1000 MCG tablet Take 1,000 mcg by mouth daily.   . Blood Glucose Monitoring Suppl (PRODIGY VOICE BLOOD GLUCOSE) w/Device KIT Use to check blood sugar 1 time per day. (Patient not taking: Reported on 06/24/2016)  . calcitonin, salmon, (MIACALCIN/FORTICAL) 200 UNIT/ACT nasal spray Place 1 spray into alternate nostrils daily.  Marland Kitchen glucose blood test strip Use to check blood sugar 1 time per day. (Patient not taking: Reported on 06/24/2016)  . [DISCONTINUED] calcium carbonate (OS-CAL) 600 MG TABS Take 600 mg by mouth daily. Reported on 09/03/2015   No facility-administered encounter medications on file as of 07/01/2016.     Assessment:  Diabetes: A1C slightly above goal <7% with hypoglycemia/nocturnal hypoglycemia following Basaglar increase from 10 units to 15 units daily. Patient has had all fasting blood glucose <100 and several <70 mg/dL.  Patient was reading meter upside down when reported a CBG of 28 mg/dL. Patient currently also on metformin 500 mg daily, nateglinide 120 mg three times daily. Patient does not like to take generic medications and is asking about what other medications she can take for diabetes.    Plan: -Counseled on signs/symptoms and treatment of hypoglycemia.  Instructed patient to treat lows with faster acting regular soda which she had on hand. -Called/Faxed Dr Theone Murdoch office to consider decreasing Basaglar or discontinuing Basaglar given patients age/A1C.  Did not hear anything from Dr Theone Murdoch office following phone call.  Instructed patient that I could not instruct her to take 10 units as she requested instead of prescribed 15 units but if she felt this would make her feel safer then that was her choice.  -Will take patient 18 more TrueMetrix test strips today  until supply can arrive.  Instructed her to check at least fasting and a few post prandials (limited due to number of test strips). -Per [facility] test strips were sent out on 06/30/16 and should arrive within 5-7 business days.   -Discussed starting SGLT2 inhibitor and patient would be open to starting a medication. Will ask Dr Ernie Hew to consider Jardiance as patient does need further post prandial control of CBGs -Will followup via telephone in 1 week.   Bennye Alm, PharmD, [place] PGY2 Pharmacy Resident [phone]

## 2016-07-03 ENCOUNTER — Other Ambulatory Visit: Payer: Self-pay

## 2016-07-03 ENCOUNTER — Other Ambulatory Visit: Payer: Self-pay | Admitting: Pharmacist

## 2016-07-03 NOTE — Patient Outreach (Signed)
Telephone follow up: Placed call to patient who reports that she is doing better. States CBG strips have not arrived yet.  Reports that shoes continues to wear her wooden shoe.  States that she feels her toe is healing. Patient reports follow up with foot doctor "next Tuesday" and is hoping to not have to wear wooden shoe any longer.  DM: reports fasting CBG on 3/1 -69.  Reports feeling weak and shaky. Reports that she ate something and felt better. Reports today's CBG of 93.  States that she took 8 units of insulin last night and ate a bedtime snack.   PLAN: Will plan follow up call next week. Notified [facility] pharmacist.  Will send this note to MD about low CBG reading and be symptomatic.  Tomasa Rand, RN, BSN, CEN [facility] [employer] [phone]

## 2016-07-03 NOTE — Patient Outreach (Signed)
[facility]) Care Management  07/03/2016  Kambria Klimaszewski Valido 1929-06-01 [id number]   Received call from Wells Guiles at [facility] office and she instructed patient is to discontinue Basaglar per Dr Ernie Hew.  She has been unable to reach Ms Dohmen but has not had success.  Dr Ernie Hew wants her to reschedule her appointment for a few weeks after she has had time to track her CBGs fasting and post prandial. [facility] pharmacist placed call to Ms Kostelnik to discontinue Basaglar but have been unable to reach her.    Plan:  Discussed changes with patient and instructed her to stop Basaglar Insulin and to check her CBGs as instructed once she receives her test strips in the mail. Discussed trial of Jardiance with Dr Theone Murdoch nurse as a possibility for starting medication that can lower her A1C without the risk of hypoglycemia as well as a medication that is brand name which patient seems to prefer.    Bennye Alm, PharmD, [place] PGY2 Pharmacy Resident [phone]

## 2016-07-07 ENCOUNTER — Other Ambulatory Visit: Payer: Self-pay

## 2016-07-07 ENCOUNTER — Ambulatory Visit (INDEPENDENT_AMBULATORY_CARE_PROVIDER_SITE_OTHER): Payer: Medicare Other | Admitting: Podiatry

## 2016-07-07 ENCOUNTER — Encounter: Payer: Self-pay | Admitting: Podiatry

## 2016-07-07 VITALS — BP 151/66 | HR 70

## 2016-07-07 DIAGNOSIS — E1142 Type 2 diabetes mellitus with diabetic polyneuropathy: Secondary | ICD-10-CM

## 2016-07-07 DIAGNOSIS — L84 Corns and callosities: Secondary | ICD-10-CM | POA: Diagnosis not present

## 2016-07-07 DIAGNOSIS — Q828 Other specified congenital malformations of skin: Secondary | ICD-10-CM | POA: Diagnosis not present

## 2016-07-07 NOTE — Patient Instructions (Signed)
Continue wearing the surgical shoe on the left foot Wear the toe prop on the left toes Diabetes and Foot Care Diabetes [place] cause you to have problems because of poor blood supply (circulation) to your feet and legs. This Rennels cause the skin on your feet to become thinner, break easier, and heal more slowly. Your skin Spira become dry, and the skin Opdahl peel and crack. You Buechele also have nerve damage in your legs and feet causing decreased feeling in them. You Pekala not notice minor injuries to your feet that could lead to infections or more serious problems. Taking care of your feet is one of the most important things you can do for yourself. Follow these instructions at home:  Wear shoes at all times, even in the house. Do not go barefoot. Bare feet are easily injured.  Check your feet daily for blisters, cuts, and redness. If you cannot see the bottom of your feet, use a mirror or ask someone for help.  Wash your feet with warm water (do not use hot water) and mild soap. Then pat your feet and the areas between your toes until they are completely dry. Do not soak your feet as this can dry your skin.  Apply a moisturizing lotion or petroleum jelly (that does not contain alcohol and is unscented) to the skin on your feet and to dry, brittle toenails. Do not apply lotion between your toes.  Trim your toenails straight across. Do not dig under them or around the cuticle. File the edges of your nails with an emery board or nail file.  Do not cut corns or calluses or try to remove them with medicine.  Wear clean socks or stockings every day. Make sure they are not too tight. Do not wear knee-high stockings since they Ranieri decrease blood flow to your legs.  Wear shoes that fit properly and have enough cushioning. To break in new shoes, wear them for just a few hours a day. This prevents you from injuring your feet. Always look in your shoes before you put them on to be sure there are no objects  inside.  Do not cross your legs. This Litsey decrease the blood flow to your feet.  If you find a minor scrape, cut, or break in the skin on your feet, keep it and the skin around it clean and dry. These areas Wilczak be cleansed with mild soap and water. Do not cleanse the area with peroxide, alcohol, or iodine.  When you remove an adhesive bandage, be sure not to damage the skin around it.  If you have a wound, look at it several times a day to make sure it is healing.  Do not use heating pads or hot water bottles. They Macdowell burn your skin. If you have lost feeling in your feet or legs, you Bealer not know it is happening until it is too late.  Make sure your health care provider performs a complete foot exam at least annually or more often if you have foot problems. Report any cuts, sores, or bruises to your health care provider immediately. Contact a health care provider if:  You have an injury that is not healing.  You have cuts or breaks in the skin.  You have an ingrown nail.  You notice redness on your legs or feet.  You feel burning or tingling in your legs or feet.  You have pain or cramps in your legs and feet.  Your legs or feet  are numb.  Your feet always feel cold. Get help right away if:  There is increasing redness, swelling, or pain in or around a wound.  There is a red line that goes up your leg.  Pus is coming from a wound.  You develop a fever or as directed by your health care provider.  You notice a bad smell coming from an ulcer or wound. This information is not intended to replace advice given to you by your health care provider. Make sure you discuss any questions you have with your health care provider. Document Released: 04/17/2000 Document Revised: 09/26/2015 Document Reviewed: 09/27/2012 Elsevier Interactive Patient Education  2017 [employer].

## 2016-07-07 NOTE — Patient Outreach (Signed)
Telephone call: Incoming call back from patient.    I relay suggestion from pharmacy to monitor CBG tonight and in am. Informed patient that [place] would contact patient tomorrow.  She voiced understanding.  Tomasa Rand, RN, BSN, CEN [facility] [employer] [phone]

## 2016-07-07 NOTE — Progress Notes (Signed)
Patient ID: [name], female   DOB: 1929-09-02, 83 y.o.   MRN: [id number]    Subjective: This patient presents for follow-up care for ulcers cellulitis onset symptoms approximately 05/05/2016 with initial office visit on 05/12/2016. Patient was able to tolerate 6 of 7 days of Bactrim DS prescribed initially on the visit of 05/12/2016. On the visit of 05/19/2016 patient was instructed to allow the nausea vomiting to resolve and restart the Bactrim DS. Patient was able to tolerate an additional 3 days of Bactrim DS for a total of 9 days. At this time patient is nausea is resolving.Patient states today on the visit of 06/02/2016 and she was able to complete all remaining doses of Bactrim DS for a total of 14 doses. The last several days patient describes minimal nausea.   Objective: Orientated 3 No peripheral edema or calf edema or calf tenderness bilaterally DP and PT pulses 2/4 bilaterally Capillary reflex immediate bilaterally  Neurological: Sensation to 10 g monofilament wire intact 1/5 right 0/5 left Vibratory sensation nonreactive bilaterally Ankle reflex equal and reactive bilaterally  Dermatological: Minimal Erythematous edematous distal second left toe with an eschar that remains closed after debridement. There is no active drainage. There scaling over the distal second left toe and after debridement minimal distal callus  The erythema has receded from the initial visit of 05/12/2016 and now is localized to the distal toe. There is no active drainage, warmth, malodor.Low-grade edema distal second left toe reducing from initial visit of May 22, 2016, continuous improvement  Musculoskeletal: Second left toe is contracted and rigid at the PIPJ   Assessment: Resolving ulceration cellulitis second toe left foot Pre-ulcerative callus distal second left toe Hammertoe second left toe  Plan: Debrided pre-ulcerative callus distal second left toe Continue wearing toe prop on  left foot Dispensed an additional silicone toe prop Continue wearing surgical shoe Discussed the possibility of toe amputation because of the severe hammertoe and difficulty tolerating a close shoe  Reappoint 4 weeks

## 2016-07-07 NOTE — Patient Outreach (Signed)
Telephone assessment: Incoming call from patient who reports that she does not feel well. States she stopped her insulin as directed on 07/03/2016 and got sick the next day with NVD.  States she did not do much all weekend because she felt bad.  Patient reports that she is only monitoring CBG once a day because her strips still have not arrived.   07/04/2016  CBG   159 07/05/2016  CBG   143 07/06/2016  CBG   216 07/07/2016  CBG   172.  Patient thinks stopping her insulin  "cold [place]" was a shock to her body.  Denies fever and states that she is able to keep liquids down.   Reports that she has follow up planned with Dr. Ernie Hew on 07/14/2016.  PLAN: [facility] pharmacist, Joseph Berkshire to inquire about these symptoms after stopping insulin... Reviewed readings and symptoms.  Placed call back to patient to ask her monitor CBG again tonight after eating. No answer. [facility] pharmacy will plan to follow up with patient tomorrow.    Tomasa Rand, RN, BSN, CEN [facility] [employer] [phone]

## 2016-07-08 ENCOUNTER — Other Ambulatory Visit: Payer: Self-pay | Admitting: Pharmacist

## 2016-07-08 NOTE — Patient Outreach (Signed)
[facility]) Care Management  07/08/2016  [name] [birth year] [id number]   Patient reports diarrhea and vomiting over the weekend which has now resolved she believes.  Denies fever or chills.  Reports she is staying well hydrated but reports she has been trying to stay hydrated.  She denies signs/symptoms of hypoglycemia since stopping insulin.  She has not yet received her CBG test strips and has ~8 CBG test strips remaining.    CBG fasting today: 155 mg/dL  CBG random/post prandial last night: 289 mg/dL   07/04/2016  CBG   159 07/05/2016  CBG   143 07/06/2016  CBG   216 07/07/2016  CBG   172    Plan: Instructed patient to drink a lot of water to stay hydrated following her vomiting/diarrhea episode over the weekend.   Per A1 Diabetes medicare will not pay for test strips until 07/18/16. Patient should call 15th or 16th.   [facility] pharmacy is currently assisting patient with CBG test strips until patient receives her test strips.   Will followup with Dr Ernie Hew about test strip prescription as the reason medicare is likely not paying until 3/17 is because patient has been checking more times per day than the prescription is written for. [place] will open medication management case as patient will likely need ongoing assistance with diabetes.    Bennye Alm, PharmD, BCPS [facility] PGY2 Pharmacy Resident [phone]  [facility] CM Care Plan Problem One   Flowsheet Row Most Recent Value  Care Plan Problem One  Knowledge deficit related to diabetes management as evidenced by hypoglycemic episodes  Role Documenting the Problem One  Clinical Pharmacist  Care Plan for Problem One  Active  [facility] CM Short Term Goal #1 (0-30 days)  Patient will check CBGs at least daily over the next 2 weeks and properly treat hypoglycemic episodes as evidenced by patient report  [facility] CM Short Term Goal #1 Start Date  07/08/16  Interventions for Short Term Goal #1  Counseled on signs/symptoms/treatment of  hypoglycemia.  [facility] pharamcist is assisting with patient obtaining CBG test strips

## 2016-07-14 DIAGNOSIS — E1159 Type 2 diabetes mellitus with other circulatory complications: Secondary | ICD-10-CM | POA: Diagnosis not present

## 2016-07-14 DIAGNOSIS — Z6821 Body mass index (BMI) 21.0-21.9, adult: Secondary | ICD-10-CM | POA: Diagnosis not present

## 2016-07-20 ENCOUNTER — Other Ambulatory Visit: Payer: Self-pay

## 2016-07-20 NOTE — Patient Outreach (Signed)
Telephone assessment:  Placed call to patient who reports to me that her CBG strips were to be mailed to her today.  States that she felt that she had a low blood sugar at church yesterday when she got shaky before eating lunch.  Also felt like she had a low blood sugar at midnight when she had numbness in her toes.  Reported that she did not check her CBG because she is running out of strips.   Patient reports that she was restarted on 5 units of insulin every day about 1 week ago. Reports CBG this am ( fasting ) was 116. Patient reports that she has been eating a bedtime snack since she restarted her insulin.  Patient reports that she will have labs repeated the first week of April.  PLAN: Will  Update [facility] pharmacist Joseph Berkshire about awaiting arrival of CBG supplies and recent concerns for hypoglycemia.  Either Merleen Nicely or myself will follow up with patient at the end of the week.  Tomasa Rand, RN, BSN, CEN [facility] [employer] [phone]

## 2016-07-21 DIAGNOSIS — M546 Pain in thoracic spine: Secondary | ICD-10-CM | POA: Diagnosis not present

## 2016-07-27 ENCOUNTER — Other Ambulatory Visit: Payer: Self-pay

## 2016-07-27 NOTE — Patient Outreach (Signed)
Telephone assessment:  Placed follow up call to patient regarding CBG supplies. Patient states that she got 100 strips for her current meter and then she states that she got a new meter with 200 strips for the new meter. States that she is set with supplies. Patient reports that she got a "steroid" injection in her back 6 days ago. Reports that since then her CBG range has been 200-300. Reports fasting CBG today of 225.  States that she has stopped her metformin due to diarrhea.  Reports that she is taking generic for starlix and 5 units of insulin per day.   Patient has labs on 4/4 and MD visit on 4/9.  Patient states that she would like to try a different oral medication for her Dm that does not cause diarrhea.   PLAN: Patient has continues to have medication concerns but no nursing concerns at this time. Will plan to close to nursing and remains active with [place] for medication concerns. Encouraged patient to call me in the future for nursing concerns.  Will send MD update and this note.  Will notify [place] of nursing closing.  Tomasa Rand, RN, BSN, CEN [facility] [employer] [phone]

## 2016-08-03 DIAGNOSIS — E1159 Type 2 diabetes mellitus with other circulatory complications: Secondary | ICD-10-CM | POA: Diagnosis not present

## 2016-08-03 DIAGNOSIS — Z79899 Other long term (current) drug therapy: Secondary | ICD-10-CM | POA: Diagnosis not present

## 2016-08-03 DIAGNOSIS — E782 Mixed hyperlipidemia: Secondary | ICD-10-CM | POA: Diagnosis not present

## 2016-08-03 DIAGNOSIS — E039 Hypothyroidism, unspecified: Secondary | ICD-10-CM | POA: Diagnosis not present

## 2016-08-05 ENCOUNTER — Other Ambulatory Visit: Payer: Self-pay | Admitting: Pharmacist

## 2016-08-05 NOTE — Patient Outreach (Signed)
[facility]) Care Management  08/05/2016  [name], 1931 [id number]  83 year old female referred to [place] for medication management. Called patient today to followup blood glucose values.  Today she states she has received her blood glucose test strips and has been checking CBGs 3-4 times per day.  Reports CBGs mostly in the 200s fastings.   She is also checking 2 hours post prandial but sometimes forgets these.  She is a little frustrated checking so often recently.    Denies signs/symptoms of hypoglycemia.    Plan: Will schedule home visit for this week to further evaluate blood glucose and need for diabetes medication management. Will plan to assess blood glucose values and medication adherence at this visit.   Bennye Alm, PharmD, [place] PGY2 Pharmacy Resident [phone]

## 2016-08-07 ENCOUNTER — Other Ambulatory Visit: Payer: Self-pay | Admitting: Pharmacist

## 2016-08-07 ENCOUNTER — Encounter: Payer: Self-pay | Admitting: Pharmacist

## 2016-08-07 ENCOUNTER — Other Ambulatory Visit: Payer: Self-pay | Admitting: Neurology

## 2016-08-07 NOTE — Patient Outreach (Signed)
[facility]) Care Management  [place]  08/07/16  [name] 09-Jul-1929 [id number]  Subjective: 83 year old female referred to [place] for medication management.  Today during pharmacy home visit patient states she is doing well and she finally received her blood glucose test strips from mail order.  She states she has followup with Dr Ernie Hew next Monday.  Patient reports compliance to her medication regimen.    Denies signs/symptoms of hypoglycemia.  Nocturia 1x/night (decreased from 3x/night) Denies pain/burning upon urination.  Reports self foot exams.  Denies changes and is seeing podiatry.   Reports adherence to medications   Reports diarrhea with 500 mg metformin XR.  Has not been taking this for ~1 month.    Diet:  Reports "eating what she wants." She doesn't eat a lot a night and sometimes eats cheese/peanut butter crackers for protein at night.  Lunch is her biggest meal and she often eats a dessert.    Denies seizures.   Her tooth pain has resolved.  Has finished Doxycycline for tooth infection.    Objective:  Vitals:   08/07/16 1415  BP: (!) 156/78  Pulse: 76    Encounter Medications: Outpatient Encounter Prescriptions as of 08/07/2016  Medication Sig Note  . acetaminophen (TYLENOL) 500 MG tablet Take 1,000 mg by mouth every 6 (six) hours as needed for pain.   Marland Kitchen ALPRAZolam (XANAX) 0.25 MG tablet Take 1 tablet (0.25 mg total) by mouth at bedtime. (Patient taking differently: Take 0.25 mg by mouth at bedtime as needed. )   . Biotin 1000 MCG tablet Take 1,000 mcg by mouth daily.    . Blood Glucose Monitoring Suppl (PRODIGY VOICE BLOOD GLUCOSE) w/Device KIT Use to check blood sugar 1 time per day.   . Cholecalciferol 5000 units capsule Take 5,000 Units by mouth daily.   . colestipol (COLESTID) 1 G tablet Take 1-2 g by mouth 3 (three) times daily. Take 2 tablets in the morning, 1 tablet at noon, and 2 tablets in the evening   . cyanocobalamin  (,VITAMIN B-12,) 1000 MCG/ML injection INJECT 1 ML INTRAMUSCULARLY EVERY 21 DAYS   . escitalopram (LEXAPRO) 10 MG tablet Take 10 mg by mouth daily.   . ferrous sulfate 325 (65 FE) MG tablet Take 325 mg by mouth every other day.   . fexofenadine (ALLEGRA) 60 MG tablet Take 60 mg by mouth daily as needed for allergies.    . furosemide (LASIX) 20 MG tablet Take 1 tablet (20 mg total) by mouth daily. Do not take until I call you about labs. (Patient taking differently: Take 20 mg by mouth as needed. Do not take until I call you about labs.) 07/19/2014: .  . glucose blood test strip Use to check blood sugar 1 time per day.   . Insulin Glargine (BASAGLAR KWIKPEN) 100 UNIT/ML SOPN Inject 5 Units into the skin daily.    Marland Kitchen levothyroxine (SYNTHROID, LEVOTHROID) 50 MCG tablet TAKE 1 TABLET DAILY BEFORE BREAKFAST   . lidocaine (LIDODERM) 5 % Place 1 patch onto the skin daily. Remove & Discard patch within 12 hours or as directed by MD (Patient taking differently: Place 1 patch onto the skin daily as needed (pain). Remove & Discard patch within 12 hours or as directed by MD)   . nateglinide (STARLIX) 60 MG tablet Take 120 mg by mouth 3 (three) times daily with meals. 2 tablets 3 times per day   . OVER THE COUNTER MEDICATION Take 1 capsule by  mouth daily. Recall Max memory supplement   . phenytoin (DILANTIN) 100 MG ER capsule One capsule in the morning and two at night   . PHENYTOIN INFATABS 50 MG tablet CHEW 1 TABLET DAILY   . Polyethyl Glycol-Propyl Glycol 0.4-0.3 % SOLN Place 2 drops into both eyes at bedtime.    . ranitidine (ZANTAC) 150 MG capsule Take 150 mg by mouth daily as needed for heartburn.    . traMADol (ULTRAM) 50 MG tablet Take 50 mg by mouth every 6 (six) hours as needed.    . trandolapril-verapamil (TARKA) 2-240 MG tablet Take 1 tablet by mouth daily.   . [DISCONTINUED] TARKA 2-180 MG per tablet TAKE 1 TABLET DAILY 06/03/2016: ER 2/240 mg  . calcitonin, salmon, (MIACALCIN/FORTICAL) 200 UNIT/ACT  nasal spray Place 1 spray into alternate nostrils daily.   . metFORMIN (GLUCOPHAGE-XR) 500 MG 24 hr tablet Take 1 tablet (500 mg total) by mouth daily with breakfast. (Patient not taking: Reported on 08/07/2016)   . [DISCONTINUED] doxycycline (VIBRAMYCIN) 100 MG capsule Take 100 mg by mouth 2 (two) times daily.   . [DISCONTINUED] SSD 1 % cream APPLY ONE APPLICATION TOPICALLY DAILY    No facility-administered encounter medications on file as of 08/07/2016.     Functional Status: In your present state of health, do you have any difficulty performing the following activities: 07/01/2016 05/27/2016  Hearing? N N  Vision? N Y  Difficulty concentrating or making decisions? N N  Walking or climbing stairs? Y Y  Dressing or bathing? N N  Doing errands, shopping? [name]  Preparing Food and eating ? N Y  Using the Toilet? N N  In the past six months, have you accidently leaked urine? N N  Do you have problems with loss of bowel control? N N  Managing your Medications? N Y  Managing your Finances? N N  Housekeeping or managing your Housekeeping? [name]  Some recent data might be hidden    Fall/Depression Screening: PHQ 2/9 Scores 07/01/2016 05/27/2016 05/13/2016 02/06/2013 11/19/2011  PHQ - 2 Score 0 0 0 0 0    Assessment: Diabetes: Currently uncontrolled with elevated blood glucose following Lantus dose decrease from 15 to 5 units daily.  Patient has history of hypoglycemic episodes with 15 units daily dose.  Patient also currently on nateglinide 120 mg three times daily. She has been unable to tolerate metformin XR 500 mg due to diarrhea.  Patient reports adherence to her medications. Patient is willing to trial SGLT2 inhibitor due to lack of hypoglycemia and cardiovascular risk reduction.   Plan: Discussed signs/symptoms/treatment of hypoglycemia Will contact Dr Ernie Hew to consider addition of Januvia based on lack of hypoglycemia risk, post prandial blood glucose control and cardiovascular risk reduction.   Will also notify of patients elevated blood pressure during home visit.  Followup telephone call in 2-3 weeks and will consider signing off at this time.   Bennye Alm, PharmD, [place] PGY2 Pharmacy Resident [phone]  [facility] CM Care Plan Problem One     Most Recent Value  Care Plan Problem One  Knowledge deficit related to diabetes management as evidenced by hypoglycemic episodes  Role Documenting the Problem One  Clinical Pharmacist  Care Plan for Problem One  Active  [facility] CM Short Term Goal #1 (0-30 days)  Patient will check CBGs at least daily over the next 2 weeks and properly treat hypoglycemic episodes as evidenced by patient report  [facility] CM Short Term Goal #1 Start Date  07/08/16  [facility] CM Short Term Goal #1 Met Date  08/07/16  Interventions for Short Term Goal #1  Counseled on signs/symptoms/treatment of hypoglycemia.  [facility] pharamcist is assisting with patient obtaining CBG test strips      

## 2016-08-10 DIAGNOSIS — E119 Type 2 diabetes mellitus without complications: Secondary | ICD-10-CM | POA: Diagnosis not present

## 2016-08-10 DIAGNOSIS — E039 Hypothyroidism, unspecified: Secondary | ICD-10-CM | POA: Diagnosis not present

## 2016-08-10 DIAGNOSIS — I10 Essential (primary) hypertension: Secondary | ICD-10-CM | POA: Diagnosis not present

## 2016-08-10 DIAGNOSIS — E782 Mixed hyperlipidemia: Secondary | ICD-10-CM | POA: Diagnosis not present

## 2016-08-17 ENCOUNTER — Ambulatory Visit: Payer: Medicare Other | Admitting: Neurology

## 2016-08-17 ENCOUNTER — Telehealth: Payer: Self-pay | Admitting: *Deleted

## 2016-08-17 NOTE — Telephone Encounter (Signed)
Called and spoke with pt. Advised office closed and have to r/s  Her follow up appt today. We will call back tomorrow at the [place] to r/s. Apologized for any inconvenience . She verbalized understanding and appreciation for call.

## 2016-08-18 ENCOUNTER — Ambulatory Visit (INDEPENDENT_AMBULATORY_CARE_PROVIDER_SITE_OTHER): Payer: Medicare Other | Admitting: Neurology

## 2016-08-18 ENCOUNTER — Encounter: Payer: Self-pay | Admitting: Neurology

## 2016-08-18 VITALS — BP 140/86 | HR 81 | Ht 67.0 in | Wt 137.5 lb

## 2016-08-18 DIAGNOSIS — G250 Essential tremor: Secondary | ICD-10-CM

## 2016-08-18 DIAGNOSIS — R569 Unspecified convulsions: Secondary | ICD-10-CM | POA: Diagnosis not present

## 2016-08-18 DIAGNOSIS — Z5181 Encounter for therapeutic drug level monitoring: Secondary | ICD-10-CM

## 2016-08-18 DIAGNOSIS — I10 Essential (primary) hypertension: Secondary | ICD-10-CM | POA: Diagnosis not present

## 2016-08-18 DIAGNOSIS — M8580 Other specified disorders of bone density and structure, unspecified site: Secondary | ICD-10-CM | POA: Diagnosis not present

## 2016-08-18 DIAGNOSIS — G4489 Other headache syndrome: Secondary | ICD-10-CM | POA: Diagnosis not present

## 2016-08-18 DIAGNOSIS — M546 Pain in thoracic spine: Secondary | ICD-10-CM | POA: Diagnosis not present

## 2016-08-18 HISTORY — DX: Essential tremor: G25.0

## 2016-08-18 MED ORDER — ALPRAZOLAM 0.25 MG PO TABS: 0.2500 mg | ORAL_TABLET | Freq: Every day | ORAL | 5 refills | Status: DC

## 2016-08-18 NOTE — Telephone Encounter (Signed)
Called pt. Offered appt today with Dr Jannifer Franklin but pt declined. She sent car off to get inspected. No way to appt. Made appt with CW,MD for 09/01/16 at 12pm. Advised her to check in at 1130am. She verbalized understanding.

## 2016-08-18 NOTE — Progress Notes (Signed)
Reason for visit: Seizures  [name] is an 83 y.o. female  History of present illness:  Ms. Bogue is an 83 year old right-handed white female with a history of seizures, she has done well on low-dose Dilantin, she claimed there has been no recurrent seizures since last seen. The patient has come in with new issues. The patient claims that 3 weeks ago she began having headaches in the frontotemporal area and behind the right eye. She denies any visual changes, nausea or vomiting, confusion, or dizziness. She has had no speech or swallowing changes. She does report some allergy symptoms associated with the pollen. She denies any sinus drainage or stuffiness in the sinuses. She has not had any new numbness or weakness or balance changes. She also reports onset of a tremor that began about 3 or 4 months ago, she reports that a sister and a brother also have similar tremors. The tremors are affecting both upper extremities, and are present with using the arms, but not at rest. She has difficulty with handwriting and with feeding herself. She returns for an evaluation.  Past Medical History:  Diagnosis Date  . Anemia   . Anxiety   . Cancer of right breast ([place]) 12/26/12   right breast 12:00 o'clock, DCIS  . Chronic cough   . Chronic facial pain   . Chronic foot pain   . Complication of anesthesia    Sore jaw; could not chew or move mouth  . Convulsions/seizures ([place]) 10/16/2014  . Diabetes mellitus    type 2 niddm x 20 years  . Dyslipidemia   . Ejection fraction   . GERD (gastroesophageal reflux disease)   . Hammer toe    bilateral  . History of colonic polyps   . HTN (hypertension)   . Hx of radiation therapy 03/07/13- 03/29/13   right breast 4250 cGy 17 sessions  . Hyperlipidemia   . Melanoma ([place])   . Metatarsal bone fracture 2014  . Obesity   . Osteoarthritis   . Palpitations   . Seasonal allergies   . Skin cancer   . Vitamin B12 deficiency     Past Surgical History:    Procedure Laterality Date  . ABDOMINAL HYSTERECTOMY    . BRAIN SURGERY    . BREAST BIOPSY Right 01/24/2013   Procedure: RE-EXCICION OF BREAST CANCER, ANTERIOR MARGINS;  Surgeon: Edward Jolly, MD;  Location: [facility];  Service: General;  Laterality: Right;  . BREAST LUMPECTOMY WITH NEEDLE LOCALIZATION Right 01/17/2013   Procedure: BREAST LUMPECTOMY WITH NEEDLE LOCALIZATION;  Surgeon: Edward Jolly, MD;  Location: [facility];  Service: General;  Laterality: Right;  . BUNIONECTOMY    . CATARACT EXTRACTION W/ INTRAOCULAR LENS IMPLANT Right   . CHOLECYSTECTOMY    . COLONOSCOPY    . CRANIOTOMY Right 10/18/2013   Procedure: CRANIOTOMY TUMOR EXCISION;  Surgeon: Floyce Stakes, MD;  Location: [facility];  Service: Neurosurgery;  Laterality: Right;  . EYE SURGERY    . HERNIA REPAIR    . KNEE ARTHROSCOPY Bilateral   . POLYPECTOMY     small adenomatous  . ULNAR TUNNEL RELEASE      Family History  Problem Relation Age of Onset  . Heart disease Mother   . Osteoporosis Mother   . Diabetes Father   . Pancreatic cancer Father   . Colon cancer    . Bone cancer Sister   . Prostate cancer Brother   . Colon cancer Brother   .  Rectal cancer Sister   . Thyroid disease Sister     benign goiter resected    Social history:  reports that she has never smoked. She has never used smokeless tobacco. She reports that she does not drink alcohol or use drugs.    Allergies  Allergen Reactions  . Bystolic [Nebivolol Hcl] Other (See Comments)    "extreme weakness, heaviness in legs & arms, swelling in legs/arms/face, swollen abdomen, pain in bladder, feet pain, soreness in chest"  . Cholestyramine     "itching rash on stomach, bloated, nausea, vomiting, sleeplessness, extreme pain in arms"  . Hydrazine Yellow [Tartrazine] Other (See Comments)    "does not reduce high blood pressure, pain in arm, high pressure, felt like I was on verge of heart attack, really weak"  . Morphine Other (See Comments)     Feels morbid, weak, still in pain  . Niacin Palpitations    Fast heart beat  . Niaspan [Niacin Er] Palpitations and Other (See Comments)    "fast heart beat, high blood pressure"  . Norvasc [Amlodipine Besylate] Other (See Comments)    "extreme fluid retention/pain)  . Optivar [Azelastine Hcl] Photosensitivity  . Repaglinide Hives  . Sular [Nisoldipine Er] Other (See Comments)    "severe headaches, swelling eyes, hands, feet, shortness of breath, weak, flushed face, brain boiling, fluid retention, high blood sugar, nervous, heart fast beating"  . Tegretol [Carbamazepine] Other (See Comments)    Blood poisoning   . Telmisartan Other (See Comments)    "headache, difficulty urinating, high blood sugar, fluid retention"  . Cefdinir Swelling    Vaginal irritation, breathing,   . Clonidine Other (See Comments)    Dry mouth, fluid retention  . Clonidine Hydrochloride Other (See Comments)    Dry mouth, fluid retention  . Codeine Nausea And Vomiting  . Ezetimibe Other (See Comments)    Made weak  . Naproxen Other (See Comments)    Shrinks bladder  . Ziac [Bisoprolol-Hydrochlorothiazide] Other (See Comments)    "stopped urination"  . Elavil [Amitriptyline] Other (See Comments)    Gave Pt nightmares  . Keppra [Levetiracetam] Other (See Comments)    Shaking  . Lamictal [Lamotrigine]     itching  . Lyrica [Pregabalin] Swelling  . Topamax [Topiramate]     Dry eyes  . Ace Inhibitors Other (See Comments)    unknown  . Actonel [Risedronate Sodium] Other (See Comments)    unknown  . Amlodipine Besylate Other (See Comments)    unknown  . Aspirin Other (See Comments)    unknown  . Atacand [Candesartan] Other (See Comments)    unknown  . Bextra [Valdecoxib] Other (See Comments)    unknown  . Bisoprolol-Hydrochlorothiazide Other (See Comments)    unknown  . Candesartan Cilexetil Other (See Comments)    unknown  . Cefadroxil Other (See Comments)    unknown  . Celecoxib Rash  .  Hydrocodone Other (See Comments)    unknown  . Hydrocodone-Acetaminophen Other (See Comments)    unknown  . Iodinated Diagnostic Agents Rash    "All over"  . Meloxicam Other (See Comments)    unknown  . Methylprednisolone Sodium Succinate Other (See Comments)    unknown  . Nabumetone Other (See Comments)    unknown  . Penicillins Other (See Comments)    unknown  . Pseudoephedrine-Guaifenesin Other (See Comments)    unknown  . Risedronate Sodium Other (See Comments)    unknown  . Rofecoxib Other (See Comments)    unknown  .  Ru-Tuss [Chlorphen-Pse-Atrop-Hyos-Scop] Other (See Comments)    unknown  . Sulfonamide Derivatives Other (See Comments)    unknown  . Sulphur [Sulfur] Other (See Comments)    unknown  . Telithromycin Other (See Comments)    unknown  . Terfenadine Other (See Comments)    unknown  . Trandolapril-Verapamil Hcl Er Other (See Comments)    Headache, difficulty urinating, high blood sugar, fluid retention  Pt is taking Tarka (trandolapril-verapamil) currently, but requests the medication stay in her allergy list  . Valium [Diazepam] Other (See Comments)    Makes her mean and hyper    Medications:  Prior to Admission medications   Medication Sig Start Date End Date Taking? Authorizing Provider  acetaminophen (TYLENOL) 500 MG tablet Take 1,000 mg by mouth every 6 (six) hours as needed for pain.   Yes Historical Provider, MD  ALPRAZolam (XANAX) 0.25 MG tablet Take 1 tablet (0.25 mg total) by mouth at bedtime. Patient taking differently: Take 0.25 mg by mouth at bedtime as needed.  03/05/15  Yes Hoyt Koch, MD  Biotin 1000 MCG tablet Take 1,000 mcg by mouth daily.    Yes Historical Provider, MD  Blood Glucose Monitoring Suppl (PRODIGY VOICE BLOOD GLUCOSE) w/Device KIT Use to check blood sugar 1 time per day. 10/18/15  Yes Renato Shin, MD  calcitonin, salmon, (MIACALCIN/FORTICAL) 200 UNIT/ACT nasal spray Place 1 spray into alternate nostrils daily. 06/09/16   Yes Historical Provider, MD  Cholecalciferol 5000 units capsule Take 5,000 Units by mouth daily.   Yes Historical Provider, MD  colestipol (COLESTID) 1 G tablet Take 1-2 g by mouth 3 (three) times daily. Take 2 tablets in the morning, 1 tablet at noon, and 2 tablets in the evening   Yes Historical Provider, MD  cyanocobalamin (,VITAMIN B-12,) 1000 MCG/ML injection INJECT 1 ML INTRAMUSCULARLY EVERY 21 DAYS 06/18/16  Yes Hoyt Koch, MD  escitalopram (LEXAPRO) 10 MG tablet Take 10 mg by mouth daily.   Yes Historical Provider, MD  ferrous sulfate 325 (65 FE) MG tablet Take 325 mg by mouth every other day.   Yes Historical Provider, MD  fexofenadine (ALLEGRA) 60 MG tablet Take 60 mg by mouth daily as needed for allergies.    Yes Historical Provider, MD  furosemide (LASIX) 20 MG tablet Take 1 tablet (20 mg total) by mouth daily. Do not take until I call you about labs. Patient taking differently: Take 20 mg by mouth as needed. Do not take until I call you about labs. 12/27/13  Yes Marin Olp, MD  glucose blood test strip Use to check blood sugar 1 time per day. 10/22/15  Yes Renato Shin, MD  Insulin Glargine (BASAGLAR KWIKPEN) 100 UNIT/ML SOPN Inject 10 Units into the skin daily.  05/27/16  Yes Historical Provider, MD  levothyroxine (SYNTHROID, LEVOTHROID) 50 MCG tablet TAKE 1 TABLET DAILY BEFORE BREAKFAST 06/14/16  Yes Renato Shin, MD  lidocaine (LIDODERM) 5 % Place 1 patch onto the skin daily. Remove & Discard patch within 12 hours or as directed by MD Patient taking differently: Place 2 patches onto the skin daily as needed (pain). Remove & Discard patch within 12 hours or as directed by MD 01/25/15  Yes Hoyt Koch, MD  nateglinide (STARLIX) 60 MG tablet Take 120 mg by mouth 3 (three) times daily with meals. 2 tablets 3 times per day   Yes Historical Provider, MD  OVER THE COUNTER MEDICATION Take 1 capsule by mouth daily. Recall Max memory supplement   Yes  Historical Provider, MD    phenytoin (DILANTIN) 100 MG ER capsule TAKE 3 CAPSULES AT BEDTIME Patient taking differently: TAKE 3 CAPSULES AT BEDTIME. 2 capsules at bedtime and 1 in the morning 08/10/16  Yes Kathrynn Ducking, MD  PHENYTOIN INFATABS 50 MG tablet CHEW 1 TABLET DAILY 04/22/16  Yes Kathrynn Ducking, MD  Polyethyl Glycol-Propyl Glycol 0.4-0.3 % SOLN Place 2 drops into both eyes at bedtime.    Yes Historical Provider, MD  ranitidine (ZANTAC) 150 MG capsule Take 150 mg by mouth daily as needed for heartburn.    Yes Historical Provider, MD  traMADol (ULTRAM) 50 MG tablet Take 50 mg by mouth every 6 (six) hours as needed.    Yes Historical Provider, MD  trandolapril-verapamil (TARKA) 2-240 MG tablet Take 1 tablet by mouth daily.   Yes Historical Provider, MD    ROS:  Out of a complete 14 system review of symptoms, the patient complains only of the following symptoms, and all other reviewed systems are negative.  Fatigue Eye itching, double vision Cough Diarrhea Daytime sleepiness Frequent infections Back pain Itching Headache, tremors, agitation  Blood pressure 140/86, pulse 81, height _0  (1.702 m), weight 137 lb 8 oz (62.4 kg).  Physical Exam  General: The patient is alert and cooperative at the time of the examination.  Eyes: Pupils are equal, round, and react to light. Discs are flat bilaterally.  Neck: Neck is supple, no carotid bruits are noted  Skin: No significant peripheral edema is noted.   Neurologic Exam  Mental status: The patient is alert and oriented x 3 at the time of the examination. The patient has apparent normal recent and remote memory, with an apparently normal attention span and concentration ability.   Cranial nerves: Facial symmetry is present. Speech is normal, no aphasia or dysarthria is noted. Extraocular movements are full. Visual fields are full.  Motor: The patient has good strength in all 4 extremities.  Sensory examination: Soft touch sensation is symmetric  on the face, arms, and legs.  Coordination: The patient has good finger-nose-finger and heel-to-shin bilaterally. Intention tremors are noted in a symmetric fashion with both upper extremities.  Gait and station: The patient has a normal gait. Tandem gait is unstable. Romberg is negative. No drift is seen.  Reflexes: Deep tendon reflexes are symmetric.   Assessment/Plan:  1. History of seizures, well controlled  2. Essential tremor  3. Right frontotemporal headache  The patient has developed a new headache in the right frontotemporal area. The patient will be evaluated for this. She is taking Ultram if needed for pain. She will be sent for a CT of the head, she will have blood work done today. The patient will be followed for the tremor, she has a strong family history of tremor and likely has an essential tremor. She will follow-up in about 4 months. She is doing well with the seizures.  Jill Alexanders MD 08/18/2016 5:36 PM  [facility] [address] [place], [place] [address]  Phone [phone] Fax [phone]

## 2016-08-18 NOTE — Patient Instructions (Signed)
   We will get CT of the head. We will check blood work.

## 2016-08-18 NOTE — Telephone Encounter (Signed)
Called pt. Scheduled appt for 5pm today (ok per CW,MD) check in 430pm. She verbalized understanding.

## 2016-08-19 ENCOUNTER — Telehealth: Payer: Self-pay | Admitting: Neurology

## 2016-08-19 MED ORDER — ALPRAZOLAM 0.25 MG PO TABS: 0.2500 mg | ORAL_TABLET | Freq: Every day | ORAL | 1 refills | Status: DC

## 2016-08-19 NOTE — Telephone Encounter (Signed)
Faxed printed/signed rx alprazolam to pt pharmacy. Fax: [phone]. Received confirmation.

## 2016-08-19 NOTE — Addendum Note (Signed)
Addended by: Hope Pigeon on: 08/19/2016 10:55 AM   Modules accepted: Orders

## 2016-08-19 NOTE — Telephone Encounter (Signed)
Printed rx, awaiting MD signature  

## 2016-08-19 NOTE — Telephone Encounter (Signed)
Patient is calling to get a new Rx called to [facility] for ALPRAZolam (XANAX) 0.25 MG tablet #90 with 3 refills.

## 2016-08-19 NOTE — Telephone Encounter (Signed)
Spoke with CW,MD. He gave VO to send qty90, 1 refill.

## 2016-08-20 ENCOUNTER — Other Ambulatory Visit (INDEPENDENT_AMBULATORY_CARE_PROVIDER_SITE_OTHER): Payer: Self-pay

## 2016-08-20 DIAGNOSIS — Z5181 Encounter for therapeutic drug level monitoring: Secondary | ICD-10-CM | POA: Diagnosis not present

## 2016-08-20 DIAGNOSIS — G4489 Other headache syndrome: Secondary | ICD-10-CM | POA: Diagnosis not present

## 2016-08-20 DIAGNOSIS — Z0289 Encounter for other administrative examinations: Secondary | ICD-10-CM

## 2016-08-21 LAB — PHENYTOIN LEVEL, TOTAL: Phenytoin (Dilantin), Serum: 5.8 ug/mL — ABNORMAL LOW (ref 10.0–20.0)

## 2016-08-21 LAB — SEDIMENTATION RATE: Sed Rate: 2 mm/hr (ref 0–40)

## 2016-08-21 LAB — C-REACTIVE PROTEIN: CRP: 0.7 mg/L (ref 0.0–4.9)

## 2016-08-24 ENCOUNTER — Other Ambulatory Visit: Payer: Medicare Other

## 2016-08-24 ENCOUNTER — Telehealth: Payer: Self-pay | Admitting: *Deleted

## 2016-08-24 ENCOUNTER — Other Ambulatory Visit: Payer: Self-pay | Admitting: Pharmacist

## 2016-08-24 NOTE — Telephone Encounter (Signed)
-----   Message from Kathrynn Ducking, MD sent at 08/23/2016  1:35 PM EDT ----- Blood work was OK, Dilantin level is low but is stable from prior blood work, no change in dosing. Please call the patient. ----- Message ----- From: Lavone Neri Lab Results In Sent: 08/21/2016   7:43 AM To: Kathrynn Ducking, MD

## 2016-08-24 NOTE — Patient Outreach (Signed)
[facility]) Care Management  08/24/2016  [name] 1929/06/09 [id number]   83 year old female referred to [facility] for medication management.  Received call from patient today in which she is concerned she is having a side effect from her basaglar insulin.  Patient reports a hand tremor and headaches.  She also reports a confusion episode where she mixed up the days on Sunday.  Patient denies hypoglycemic episodes or CBGs <70 mg/dL during these episodes. Patient states she has a CT scan tomorrow but denies signs/symptoms of seizures.  She is hesitant to take her insulin and states she skipped the insulin last night due to thinking it was causing the tremor/headaches/confusion.  Patient reports adherence to her medications except for skipping insulin last night.    Patient reported fasting CBGs: 171, 126, 260, 174, 179, 205, 273, 137 mg/dL  Reported 2 hours post prandial CBGs: 326, 306, 329, 213, 310, 232 mg/dL   Plan: Instructed patient to contact Dr Ernie Hew about tremor, headaches, confusion episodes. [facility] pharmacist will send fax to notify provider. Instructed patient that if CBGs >70 and not having signs/symptoms of hypoglycemia then the insulin is likely not the cause of her symptoms. Discussed importance of adherence to her medications Will followup in 1 week via telephone as scheduled.    Bennye Alm, PharmD, [place] PGY2 Pharmacy Resident [phone]

## 2016-08-24 NOTE — Telephone Encounter (Signed)
Called and spoke with patient about lab results per CW,MD note. Patient verbalized understanding.   Patient states the insulin is new. She started this about 3 weeks ago. [name] started around the same time. She slept Sunday morning through church because she thought it was Saturday. She wanted to let Dr Jannifer Franklin know. Advised I will forward message to him. Advised her to contact PCP about this. They Sitts want to evaluate her and discuss this further.

## 2016-08-25 ENCOUNTER — Encounter: Payer: Self-pay | Admitting: Podiatry

## 2016-08-25 ENCOUNTER — Ambulatory Visit
Admission: RE | Admit: 2016-08-25 | Discharge: 2016-08-25 | Disposition: A | Discharge status: Home / Self Care | Payer: Medicare Other | Source: Ambulatory Visit | Attending: Neurology | Admitting: Neurology

## 2016-08-25 ENCOUNTER — Ambulatory Visit (INDEPENDENT_AMBULATORY_CARE_PROVIDER_SITE_OTHER): Payer: Medicare Other | Admitting: Podiatry

## 2016-08-25 DIAGNOSIS — G4489 Other headache syndrome: Secondary | ICD-10-CM

## 2016-08-25 DIAGNOSIS — L84 Corns and callosities: Secondary | ICD-10-CM | POA: Diagnosis not present

## 2016-08-25 DIAGNOSIS — G250 Essential tremor: Secondary | ICD-10-CM | POA: Diagnosis not present

## 2016-08-25 DIAGNOSIS — L608 Other nail disorders: Secondary | ICD-10-CM | POA: Diagnosis not present

## 2016-08-25 DIAGNOSIS — R51 Headache: Secondary | ICD-10-CM | POA: Diagnosis not present

## 2016-08-25 DIAGNOSIS — R569 Unspecified convulsions: Secondary | ICD-10-CM | POA: Diagnosis not present

## 2016-08-25 DIAGNOSIS — E1142 Type 2 diabetes mellitus with diabetic polyneuropathy: Secondary | ICD-10-CM

## 2016-08-25 NOTE — Patient Instructions (Signed)
Wear the toe prop on the left foot on ongoing continuous daily basis Wear diabetic shoes daily  Diabetes and Foot Care Diabetes [place] cause you to have problems because of poor blood supply (circulation) to your feet and legs. This [place] cause the skin on your feet to become thinner, break easier, and heal more slowly. Your skin Reetz become dry, and the skin Tramell peel and crack. You Coard also have nerve damage in your legs and feet causing decreased feeling in them. You [place] not notice minor injuries to your feet that could lead to infections or more serious problems. Taking care of your feet is one of the most important things you can do for yourself. Follow these instructions at home:  Wear shoes at all times, even in the house. Do not go barefoot. Bare feet are easily injured.  Check your feet daily for blisters, cuts, and redness. If you cannot see the bottom of your feet, use a mirror or ask someone for help.  Wash your feet with warm water (do not use hot water) and mild soap. Then pat your feet and the areas between your toes until they are completely dry. Do not soak your feet as this can dry your skin.  Apply a moisturizing lotion or petroleum jelly (that does not contain alcohol and is unscented) to the skin on your feet and to dry, brittle toenails. Do not apply lotion between your toes.  Trim your toenails straight across. Do not dig under them or around the cuticle. File the edges of your nails with an emery board or nail file.  Do not cut corns or calluses or try to remove them with medicine.  Wear clean socks or stockings every day. Make sure they are not too tight. Do not wear knee-high stockings since they Bolding decrease blood flow to your legs.  Wear shoes that fit properly and have enough cushioning. To break in new shoes, wear them for just a few hours a day. This prevents you from injuring your feet. Always look in your shoes before you put them on to be sure there are no objects  inside.  Do not cross your legs. This [place] decrease the blood flow to your feet.  If you find a minor scrape, cut, or break in the skin on your feet, keep it and the skin around it clean and dry. These areas Thompson be cleansed with mild soap and water. Do not cleanse the area with peroxide, alcohol, or iodine.  When you remove an adhesive bandage, be sure not to damage the skin around it.  If you have a wound, look at it several times a day to make sure it is healing.  Do not use heating pads or hot water bottles. They Salatino burn your skin. If you have lost feeling in your feet or legs, you Kracht not know it is happening until it is too late.  Make sure your health care provider performs a complete foot exam at least annually or more often if you have foot problems. Report any cuts, sores, or bruises to your health care provider immediately. Contact a health care provider if:  You have an injury that is not healing.  You have cuts or breaks in the skin.  You have an ingrown nail.  You notice redness on your legs or feet.  You feel burning or tingling in your legs or feet.  You have pain or cramps in your legs and feet.  Your legs or  feet are numb.  Your feet always feel cold. Get help right away if:  There is increasing redness, swelling, or pain in or around a wound.  There is a red line that goes up your leg.  Pus is coming from a wound.  You develop a fever or as directed by your health care provider.  You notice a bad smell coming from an ulcer or wound. This information is not intended to replace advice given to you by your health care provider. Make sure you discuss any questions you have with your health care provider. Document Released: 04/17/2000 Document Revised: 09/26/2015 Document Reviewed: 09/27/2012 Elsevier Interactive Patient Education  2017 [employer].

## 2016-08-25 NOTE — Progress Notes (Signed)
Patient ID: [name], female   DOB: 10-11-1929, 83 y.o.   MRN: [id number]   Subjective: This patient presents today for follow-up evaluation of skin ulcerations on the second left toe with onset on January 2018 with subsequent local wound care resulting in pre-ulcerative callus on the distal left toe on the visit of 07/07/2016. Patient underwent oral antibiotics, debridement and local wound care. Patient also is requesting nail debridement Patient is a diabetic with poor control    Orientated 3 No peripheral edema or calf edema or calf tenderness bilaterally DP and PT pulses 2/4 bilaterally Capillary reflex immediate bilaterally  Neurological: Sensation to 10 g monofilament wire intact 1/5 right 0/5 left Vibratory sensation nonreactive bilaterally Ankle reflex equal and reactive bilaterally  Dermatological: Minimal Erythematous edematous distal second left toe with an eschar that remains closed after debridement. There is no active drainage. There scaling over the distal second left toe and after debridement minimal distal callus  The erythema has receded from the initial visit of 05/12/2016 and now is localized to the distal toe. There is no active drainage, warmth, malodor.Low-grade edema distal second left toe reducing from initial visit of May 22, 2016,continuous improvement The toenails elongated, incurvated and deformed 6-10 Atrophic skin with absent hair growth bilaterally The ulcerative plantar callus first MPJ bilaterally Musculoskeletal: Second left toe is contracted and rigid at the Reform 2-5 bilaterally  Assessment: Resolved ulceration cellulitis second toe left foot Pre-ulcerative callus distal second left toe Hammertoe second left toe Incurvated toenails 6-10 Diabetic peripheral neuropathy  Plan: Debrided pre-ulcerative callus distal second left toe Debridement toenails 6-10 mechanically and left without any bleeding  Reappoint 8 weeks  for skin a nail debridement Continue wearing toe prop on left foot

## 2016-08-27 ENCOUNTER — Telehealth: Payer: Self-pay | Admitting: Neurology

## 2016-08-27 NOTE — Telephone Encounter (Signed)
I called patient. CT the head is unremarkable, no change from one year ago, chronic right frontal encephalomalacia seen. I discussed this with the patient. Headaches are improving, we will cancel the [place] 1st appointment.   CT brain 08/27/16:  IMPRESSION:  Abnormal CT head (without) demonstrating: 1. Right frontal encephalomalacia and overlying craniotomy changes.  2. Chronic lacunar infarction in the left basal ganglia.  3. No acute findings. 4. No change from CT on 08/19/15.

## 2016-08-31 DIAGNOSIS — E119 Type 2 diabetes mellitus without complications: Secondary | ICD-10-CM | POA: Diagnosis not present

## 2016-08-31 DIAGNOSIS — R410 Disorientation, unspecified: Secondary | ICD-10-CM | POA: Diagnosis not present

## 2016-08-31 DIAGNOSIS — Z6822 Body mass index (BMI) 22.0-22.9, adult: Secondary | ICD-10-CM | POA: Diagnosis not present

## 2016-09-01 ENCOUNTER — Other Ambulatory Visit: Payer: Self-pay | Admitting: Pharmacist

## 2016-09-01 ENCOUNTER — Ambulatory Visit: Payer: Self-pay | Admitting: Neurology

## 2016-09-01 NOTE — Patient Outreach (Signed)
[facility]) Care Management  09/01/2016  [name] [birth year] [id number]  83 y.o. year old female referred to [facility] for Medication Management (Pharmacy telephone outreach )   Was unable to reach patient via telephone today and have left HIPAA compliant voicemail asking him to return my call (unsuccessful outreach #1).  Plan: Followup in 2-3 days via telephone  Bennye Alm, PharmD, [place] PGY2 Pharmacy Resident [phone]

## 2016-09-04 ENCOUNTER — Other Ambulatory Visit: Payer: Self-pay | Admitting: Pharmacist

## 2016-09-04 ENCOUNTER — Ambulatory Visit: Payer: Self-pay | Admitting: Pharmacist

## 2016-09-04 NOTE — Patient Outreach (Signed)
[facility]) Care Management  09/04/2016  [name] 1929-12-14 [id number]  83 y.o. year old female referred to [facility] for Medication Management (Pharmacy telephone outreach)   Was unable to reach patient via telephone today and have left HIPAA compliant voicemail asking him to return my call (unsuccessful outreach #2).  Plan: Followup in 1 week via telephone  Bennye Alm, PharmD, [place] PGY2 Pharmacy Resident [phone]

## 2016-09-07 ENCOUNTER — Encounter: Payer: Self-pay | Admitting: Podiatry

## 2016-09-07 ENCOUNTER — Ambulatory Visit (INDEPENDENT_AMBULATORY_CARE_PROVIDER_SITE_OTHER): Payer: Medicare Other | Admitting: Podiatry

## 2016-09-07 DIAGNOSIS — E0842 Diabetes mellitus due to underlying condition with diabetic polyneuropathy: Secondary | ICD-10-CM | POA: Diagnosis not present

## 2016-09-07 DIAGNOSIS — L97512 Non-pressure chronic ulcer of other part of right foot with fat layer exposed: Secondary | ICD-10-CM | POA: Diagnosis not present

## 2016-09-07 DIAGNOSIS — I70235 Atherosclerosis of native arteries of right leg with ulceration of other part of foot: Secondary | ICD-10-CM | POA: Diagnosis not present

## 2016-09-09 ENCOUNTER — Other Ambulatory Visit: Payer: Self-pay | Admitting: Pharmacist

## 2016-09-09 NOTE — Progress Notes (Signed)
   Subjective:  Patient with a history of diabetes mellitus presents today for evaluation of an ulceration to the plantar right forefoot onset three weeks ago. She states she cut a piece of the callus from her foot using scissors three days ago. She denies any pain or any other complaints. Patient presents today for further treatment and evaluation   Objective/Physical Exam General: The patient is alert and oriented x3 in no acute distress.  Dermatology:  Wound #1 noted to the right plantar medial forefoot secondary to DM measuring 0.3 x 0.3 x 0.1 cm (LxWxD).   To the noted ulceration(s), there is no eschar. There is a moderate amount of slough, fibrin, and necrotic tissue noted. Granulation tissue and wound base is red. There is a minimal amount of serosanguineous drainage noted. There is no exposed bone muscle-tendon ligament or joint. There is no malodor. Periwound integrity is intact. Skin is warm, dry and supple bilateral lower extremities.  Vascular: Palpable pedal pulses bilaterally. No edema or erythema noted. Capillary refill within normal limits.  Neurological: Epicritic and protective threshold absent bilaterally.   Musculoskeletal Exam: Range of motion within normal limits to all pedal and ankle joints bilateral. Muscle strength 5/5 in all groups bilateral.   Assessment: #1 Ulcer secondary to diabetes mellitus noted to the right plantar medial forefoot #2 diabetes mellitus w/ peripheral neuropathy   Plan of Care:  #1 Patient was evaluated. #2 medically necessary excisional debridement including subcutaneous tissue was performed using a tissue nipper and a chisel blade. Excisional debridement of all the necrotic nonviable tissue down to healthy bleeding viable tissue was performed with post-debridement measurements same as pre-. #3 the wound was cleansed and dry sterile dressing applied. #4 patient is to return to clinic when necessary.   Edrick Kins, DPM [facility]  Dr. Edrick Kins, [address]                                        [place], [place] [address]                Office [phone]  Fax [phone]

## 2016-09-09 NOTE — Patient Outreach (Signed)
[facility]) Care Management  09/09/2016  [name] [birth year] [id number]   83 year old female referred to [facility] for medication management.  Today patient states she saw Dr Ernie Hew last week and Jardiance 25 mg daily was started.  She has self discontinued generic Nataglenide but She has been taking brand Starlix that she found 60 mg three times daily with meals as she does not believe the generic nateglenide works for her.   She continues on Basaglar 10 units daily.  Reports adherence to her medications except nateglinide.    Denies hypoglycemia and Reports improved blood glucose.  She has Dr Ernie Hew followup in 6 weeks.    She states she cut her foot last Friday.  Her foot was seen by Dr Amalia Hailey on 09/07/16. She reports this is healing but does have some bleeding.  Plan: Discussed signs/symptoms/treatment of hypoglycemia Will plan [facility] pharmacy home visit in 2 weeks to access medication management and CBG log.   Discussed importance of adherence to medications  Bennye Alm, PharmD, [place] PGY2 Pharmacy Resident [phone]

## 2016-09-22 ENCOUNTER — Encounter: Payer: Self-pay | Admitting: Pharmacist

## 2016-09-22 ENCOUNTER — Other Ambulatory Visit: Payer: Self-pay | Admitting: Pharmacist

## 2016-09-22 NOTE — Patient Outreach (Signed)
[facility]) Care Management  [place]  09/22/2016  Sophiya Morello Demauro March 24, 1930 [id number]  Subjective: 83 year old female referred to [place] for medication management.   Today patient reports post prandial CBGs remain elevated and she is not taking atorvastatin or starlix.  She also reports itching for 1 hour after on arms or legs after taking Basaglar (2 pens remaining) but she still takes the medication.  She went to beach last week which Zettler have affected some of her CBGs.  She is expected to go for steroid injection in her back in June.    Denies hypoglycemia except 1 episode last night (did not have any symptoms).   States sometimes feels week but this is rare.   Denies chest pain. Denies seizures.  Patient reports A1C in March or April was 7.6% Has followup with Dr Ernie Hew next week.   Objective:  Fasting CBG: 141, 208, 141, 123, 291, 108, 129, 146, 261, 90, 116, 120, 146, 129, 135, 163, 172, 136 mg/dL   CBG 2 hours after lunch: 220, 266, 271, 271, 246, 238, 272, 131, 195, 115, 210, 210, 279, 182, 302 mg/dL   CBG 2 hours after dinner: 56, 168, 228, 216, 219, 180, 259, 248, 170, 151, 86, 270, 195 mg/dL   Encounter Medications: Outpatient Encounter Prescriptions as of 09/22/2016  Medication Sig Note  . acetaminophen (TYLENOL) 500 MG tablet Take 1,000 mg by mouth every 6 (six) hours as needed for pain.   Marland Kitchen ALPRAZolam (XANAX) 0.25 MG tablet Take 1 tablet (0.25 mg total) by mouth at bedtime.   . Biotin 1000 MCG tablet Take 1,000 mcg by mouth daily.    . Blood Glucose Monitoring Suppl (PRODIGY VOICE BLOOD GLUCOSE) w/Device KIT Use to check blood sugar 1 time per day.   . Cholecalciferol 5000 units capsule Take 5,000 Units by mouth daily.   . colestipol (COLESTID) 1 G tablet Take 1-2 g by mouth 3 (three) times daily. Take 2 tablets in the morning, 1 tablet at noon, and 2 tablets in the evening   . cyanocobalamin (,VITAMIN B-12,) 1000 MCG/ML injection INJECT 1  ML INTRAMUSCULARLY EVERY 21 DAYS   . escitalopram (LEXAPRO) 10 MG tablet Take 10 mg by mouth daily.   . ferrous sulfate 325 (65 FE) MG tablet Take 325 mg by mouth every other day.   . fexofenadine (ALLEGRA) 60 MG tablet Take 60 mg by mouth daily as needed for allergies.    . furosemide (LASIX) 20 MG tablet Take 1 tablet (20 mg total) by mouth daily. Do not take until I call you about labs. (Patient taking differently: Take 20 mg by mouth as needed. Do not take until I call you about labs.) 07/19/2014: .  . glucose blood test strip Use to check blood sugar 1 time per day.   . Insulin Glargine (BASAGLAR KWIKPEN) 100 UNIT/ML SOPN Inject 10 Units into the skin daily.    Marland Kitchen JARDIANCE 25 MG TABS tablet Take 1 tablet by mouth daily.   Marland Kitchen levothyroxine (SYNTHROID, LEVOTHROID) 50 MCG tablet TAKE 1 TABLET DAILY BEFORE BREAKFAST   . lidocaine (LIDODERM) 5 % Place 1 patch onto the skin daily. Remove & Discard patch within 12 hours or as directed by MD (Patient taking differently: Place 2 patches onto the skin daily as needed (pain). Remove & Discard patch within 12 hours or as directed by MD)   . OVER THE COUNTER MEDICATION Take 1 capsule by mouth daily. Recall Max memory supplement   .  phenytoin (DILANTIN) 100 MG ER capsule TAKE 3 CAPSULES AT BEDTIME (Patient taking differently: TAKE 3 CAPSULES AT BEDTIME. 2 capsules at bedtime and 1 in the morning)   . PHENYTOIN INFATABS 50 MG tablet CHEW 1 TABLET DAILY   . Polyethyl Glycol-Propyl Glycol 0.4-0.3 % SOLN Place 2 drops into both eyes at bedtime.    . ranitidine (ZANTAC) 150 MG capsule Take 150 mg by mouth daily as needed for heartburn.    . traMADol (ULTRAM) 50 MG tablet Take 50 mg by mouth every 6 (six) hours as needed.    . trandolapril-verapamil (TARKA) 2-240 MG tablet Take 1 tablet by mouth daily.   Marland Kitchen atorvastatin (LIPITOR) 20 MG tablet Take 1 tablet by mouth daily.   . calcitonin, salmon, (MIACALCIN/FORTICAL) 200 UNIT/ACT nasal spray Place 1 spray into  alternate nostrils daily.   . nateglinide (STARLIX) 60 MG tablet Take 120 mg by mouth 3 (three) times daily with meals. 2 tablets 3 times per day    No facility-administered encounter medications on file as of 09/22/2016.     Functional Status: In your present state of health, do you have any difficulty performing the following activities: 07/01/2016 05/27/2016  Hearing? N N  Vision? N Y  Difficulty concentrating or making decisions? N N  Walking or climbing stairs? Y Y  Dressing or bathing? N N  Doing errands, shopping? [name]  Preparing Food and eating ? N Y  Using the Toilet? N N  In the past six months, have you accidently leaked urine? N N  Do you have problems with loss of bowel control? N N  Managing your Medications? N Y  Managing your Finances? N N  Housekeeping or managing your Housekeeping? Y Y  Some recent data might be hidden    Fall/Depression Screening: Fall Risk  07/01/2016 05/27/2016 02/06/2013  Falls in the past year? Yes Yes Yes  Number falls in past yr: 2 or more 2 or more 2 or more  Injury with Fall? Yes Yes -  Risk Factor Category  High Fall Risk High Fall Risk -  Risk for fall due to : History of fall(s);Impaired balance/gait;Impaired mobility;Medication side effect;Other (Comment) History of fall(s);Impaired mobility Impaired mobility;Impaired balance/gait  Risk for fall due to (comments): History of seizures, brain tumor - -  Follow up Falls prevention discussed;Education provided Falls prevention discussed -   PHQ 2/9 Scores 07/01/2016 05/27/2016 05/13/2016 02/06/2013 11/19/2011  PHQ - 2 Score 0 0 0 0 0      Assessment: Diabetes: Currently slightly above goal at 7.6%.  Patient reports adherence to her insulin and Jardiance but denies adherence with generic Starlix due to "it not working for her" which [place]en[place]ush be potentially related to progressive beta cell loss.  Plan: Discussed mechanism of action, efficacy and side effects of atorvastatin.  Patient has agreed to  start taking atorvastatin.  Discussed low carbohydrate diet  Discussed signs/symptoms/treatment of hypoglycemia Will ask Dr Ernie Hew to consider further titration of diabetes regimen with GLP1 agonist such as once weekly Trulicity which [place] help with post prandial control of CBGs without further risk of hypoglycemia [facility] pharmacy will sign off as patient has progressed with care plan and states adherent to her regimen without significant hypoglycemic episodes.  Please reconsult if needed.   Bennye Alm, PharmD, BCPS [facility] PGY2 Pharmacy Resident [phone]  [facility] CM Care Plan Problem One     Most Recent Value  Care Plan Problem One  Knowledge deficit related to diabetes management as evidenced  by hypoglycemic episodes  Role Documenting the Problem One  Clinical Pharmacist  Care Plan for Problem One  Active  [facility] CM Short Term Goal #1   Patient will check CBGs at least daily over the next 2 weeks and properly treat hypoglycemic episodes as evidenced by patient report  [facility] CM Short Term Goal #1 Start Date  07/08/16  [facility] CM Short Term Goal #1 Met Date  09/22/16  Interventions for Short Term Goal #1  Counseled on signs/symptoms/treatment of hypoglycemia.  [facility] pharamcist is assisting with patient obtaining CBG test strips

## 2016-10-05 DIAGNOSIS — M546 Pain in thoracic spine: Secondary | ICD-10-CM | POA: Diagnosis not present

## 2016-10-06 DIAGNOSIS — Z6822 Body mass index (BMI) 22.0-22.9, adult: Secondary | ICD-10-CM | POA: Diagnosis not present

## 2016-10-06 DIAGNOSIS — E119 Type 2 diabetes mellitus without complications: Secondary | ICD-10-CM | POA: Diagnosis not present

## 2016-10-06 DIAGNOSIS — I10 Essential (primary) hypertension: Secondary | ICD-10-CM | POA: Diagnosis not present

## 2016-10-06 DIAGNOSIS — Z79899 Other long term (current) drug therapy: Secondary | ICD-10-CM | POA: Diagnosis not present

## 2016-10-20 ENCOUNTER — Encounter: Payer: Self-pay | Admitting: Podiatry

## 2016-10-20 ENCOUNTER — Ambulatory Visit (INDEPENDENT_AMBULATORY_CARE_PROVIDER_SITE_OTHER): Payer: Medicare Other | Admitting: Podiatry

## 2016-10-20 DIAGNOSIS — E1142 Type 2 diabetes mellitus with diabetic polyneuropathy: Secondary | ICD-10-CM | POA: Diagnosis not present

## 2016-10-20 DIAGNOSIS — M79674 Pain in right toe(s): Secondary | ICD-10-CM

## 2016-10-20 DIAGNOSIS — M79675 Pain in left toe(s): Secondary | ICD-10-CM | POA: Diagnosis not present

## 2016-10-20 DIAGNOSIS — B351 Tinea unguium: Secondary | ICD-10-CM

## 2016-10-20 DIAGNOSIS — M79676 Pain in unspecified toe(s): Secondary | ICD-10-CM | POA: Diagnosis not present

## 2016-10-20 DIAGNOSIS — L84 Corns and callosities: Secondary | ICD-10-CM | POA: Diagnosis not present

## 2016-10-20 DIAGNOSIS — Q828 Other specified congenital malformations of skin: Secondary | ICD-10-CM

## 2016-10-20 NOTE — Patient Instructions (Signed)
Wear the toe prop on the left foot daily  Diabetes and Foot Care Diabetes [place] cause you to have problems because of poor blood supply (circulation) to your feet and legs. This Siebert cause the skin on your feet to become thinner, break easier, and heal more slowly. Your skin Ruehl become dry, and the skin Colclasure peel and crack. You Thinnes also have nerve damage in your legs and feet causing decreased feeling in them. You Kleinman not notice minor injuries to your feet that could lead to infections or more serious problems. Taking care of your feet is one of the most important things you can do for yourself. Follow these instructions at home:  Wear shoes at all times, even in the house. Do not go barefoot. Bare feet are easily injured.  Check your feet daily for blisters, cuts, and redness. If you cannot see the bottom of your feet, use a mirror or ask someone for help.  Wash your feet with warm water (do not use hot water) and mild soap. Then pat your feet and the areas between your toes until they are completely dry. Do not soak your feet as this can dry your skin.  Apply a moisturizing lotion or petroleum jelly (that does not contain alcohol and is unscented) to the skin on your feet and to dry, brittle toenails. Do not apply lotion between your toes.  Trim your toenails straight across. Do not dig under them or around the cuticle. File the edges of your nails with an emery board or nail file.  Do not cut corns or calluses or try to remove them with medicine.  Wear clean socks or stockings every day. Make sure they are not too tight. Do not wear knee-high stockings since they Early decrease blood flow to your legs.  Wear shoes that fit properly and have enough cushioning. To break in new shoes, wear them for just a few hours a day. This prevents you from injuring your feet. Always look in your shoes before you put them on to be sure there are no objects inside.  Do not cross your legs. This Lajara decrease the  blood flow to your feet.  If you find a minor scrape, cut, or break in the skin on your feet, keep it and the skin around it clean and dry. These areas Broker be cleansed with mild soap and water. Do not cleanse the area with peroxide, alcohol, or iodine.  When you remove an adhesive bandage, be sure not to damage the skin around it.  If you have a wound, look at it several times a day to make sure it is healing.  Do not use heating pads or hot water bottles. They Woody burn your skin. If you have lost feeling in your feet or legs, you Peeler not know it is happening until it is too late.  Make sure your health care provider performs a complete foot exam at least annually or more often if you have foot problems. Report any cuts, sores, or bruises to your health care provider immediately. Contact a health care provider if:  You have an injury that is not healing.  You have cuts or breaks in the skin.  You have an ingrown nail.  You notice redness on your legs or feet.  You feel burning or tingling in your legs or feet.  You have pain or cramps in your legs and feet.  Your legs or feet are numb.  Your feet always feel  cold. Get help right away if:  There is increasing redness, swelling, or pain in or around a wound.  There is a red line that goes up your leg.  Pus is coming from a wound.  You develop a fever or as directed by your health care provider.  You notice a bad smell coming from an ulcer or wound. This information is not intended to replace advice given to you by your health care provider. Make sure you discuss any questions you have with your health care provider. Document Released: 04/17/2000 Document Revised: 09/26/2015 Document Reviewed: 09/27/2012 Elsevier Interactive Patient Education  2017 [employer].

## 2016-10-20 NOTE — Progress Notes (Signed)
Patient ID: [name], female   DOB: December 24, 1929, 83 y.o.   MRN: [id number]   Subjective: This diabetic patient presents today for follow-up care for debridement of mycotic toenails and evaluation of pre-ulcerative calluses on the right and left feet. Patient has history of ulceration cellulitis second left toe that has resolved. She also had a visit on 09/07/2016 with Dr. Amalia Hailey for a diabetic ulcer which is resolved and heal since the visit of 09/07/2016  Objective: Orientated 3 No peripheral edema or calf edema or calf tenderness bilaterally DP and PT pulses 2/4 bilaterally Capillary reflex immediate bilaterally  Neurological: Sensation to 10 g monofilament wire intact 1/5 right 0/5 left Vibratory sensation nonreactive bilaterally Ankle reflex equal and reactive bilaterally  Dermatological: No open skin lesions bilaterally Atrophic skin with absent hair growth bilaterally Plantar calluses first MPJ with bleeding within the callus, bilaterally Callus distal second left toe with bleeding within the callus Toenails are elongated, brittle, deformed, discolored 6-10  Musculoskeletal: Hammertoe 2-5 bilaterally  Assessment: Diabetic peripheral neuropathy Hammertoe deformities bilaterally Resolved cellulitis second toe left foot Pre-ulcerative callus distal second toe left pre-ulcerative plantar callus first MPJ bilaterally Current toenails with symptoms 6-10  Plan: Debridement of toenails 6-10 mechanically analytical without a bleeding Debride all pre-ulcerative keratoses right and left without a bleeding Maintain silicone toe prop left foot  Reappoint 2 months

## 2016-11-16 DIAGNOSIS — I10 Essential (primary) hypertension: Secondary | ICD-10-CM | POA: Diagnosis not present

## 2016-11-16 DIAGNOSIS — M546 Pain in thoracic spine: Secondary | ICD-10-CM | POA: Diagnosis not present

## 2016-11-16 DIAGNOSIS — M8580 Other specified disorders of bone density and structure, unspecified site: Secondary | ICD-10-CM | POA: Diagnosis not present

## 2016-11-18 ENCOUNTER — Encounter: Payer: Self-pay | Admitting: Endocrinology

## 2016-11-18 ENCOUNTER — Ambulatory Visit (INDEPENDENT_AMBULATORY_CARE_PROVIDER_SITE_OTHER): Payer: Medicare Other | Admitting: Endocrinology

## 2016-11-18 VITALS — BP 130/82 | HR 84 | Wt 133.2 lb

## 2016-11-18 DIAGNOSIS — E1151 Type 2 diabetes mellitus with diabetic peripheral angiopathy without gangrene: Secondary | ICD-10-CM

## 2016-11-18 DIAGNOSIS — E559 Vitamin D deficiency, unspecified: Secondary | ICD-10-CM | POA: Diagnosis not present

## 2016-11-18 DIAGNOSIS — I70235 Atherosclerosis of native arteries of right leg with ulceration of other part of foot: Secondary | ICD-10-CM

## 2016-11-18 LAB — POCT GLYCOSYLATED HEMOGLOBIN (HGB A1C): Hemoglobin A1C: 8

## 2016-11-18 LAB — VITAMIN D 25 HYDROXY (VIT D DEFICIENCY, FRACTURES): VITD: 34.36 ng/mL (ref 30.00–100.00)

## 2016-11-18 MED ORDER — INSULIN LISPRO 100 UNIT/ML (KWIKPEN): 3.0000 [IU] | PEN_INJECTOR | Freq: Three times a day (TID) | SUBCUTANEOUS | 11 refills | Status: DC

## 2016-11-18 NOTE — Patient Instructions (Addendum)
Please change the basaglar and nateglinide to "humalog," 3-4 units 3 times a day (just before each meal).  For your safety, take this right before you eat, and only when you eat.   check your blood sugar twice a day.  vary the time of day when you check, between before the 3 meals, and at bedtime.  also check if you have symptoms of your blood sugar being too high or too low.  please keep a record of the readings and bring it to your next appointment here (or you can bring the meter itself).  You can write it on any piece of paper.  please call us sooner if your blood sugar goes below 70, or if you have a lot of readings over 200. Please call or message [place] next week, to tell us how the blood sugar is doing.   Please continue the same jardiance for now.   Take calcium 1200 mg per day, and vitamin-D, 5000 units per day.   blood tests are requested for you today.  We'll let you know about the results.  Please have the "reclast" infusion, once a year.  you will receive a phone call, about a day and time for an appointment Please come back for a follow-up appointment in 6 weeks.

## 2016-11-18 NOTE — Progress Notes (Signed)
Subjective:    Patient ID: [name], female    DOB: 10-15-1929, 83 y.o.   MRN: [id number]  HPI Pt returns for f/u of osteoporosis (dx'ed 2017; he has never been on medication for this; she has had these bony fractures: left foot, T-spine, right wrist, and right humerus; these happens with seizures or other falls).   Pt also returns for f/u of diabetes mellitus:  DM type: 2 (but lean body habitus suggests she is developing type 1).   Dx'ed: 6301 Complications: polyneuropathy and PAD.   Therapy: 2 oral meds, and basaglar.  DKA: never.   Severe hypoglycemia: never.   Pancreatitis: never.   Other: she took insulin for only a brief time after dx; she declines [place]; pt says she wants brand-name starlix, as the tab resembles another med; she declines prandin; she can tolerate metformin-XR only 1000/d--she gets diarrhea if she takes any more; she had to d/c pioglitizone, due to edema; she declnes [place], due to fear of cancer.  Interval history:  No recent steroids.  she brings a record of her cbg's which I have reviewed today.  It varies from 87-320.  It is in general higher as the day goes on.  she was rx'ed basaglar, 10 units each morning.  She did not tolerate 14 units qd, due to fasting hypoglycemia.  She no longer takes the nateglinde.   She takes vit-D, 5000 units qd.  No recent falls.  Past Medical History:  Diagnosis Date  . Anemia   . Anxiety   . Cancer of right breast ([place]) 12/26/12   right breast 12:00 o'clock, DCIS  . Chronic cough   . Chronic facial pain   . Chronic foot pain   . Complication of anesthesia    Sore jaw; could not chew or move mouth  . Convulsions/seizures ([place]) 10/16/2014  . Diabetes mellitus    type 2 niddm x 20 years  . Dyslipidemia   . Ejection fraction   . GERD (gastroesophageal reflux disease)   . Hammer toe    bilateral  . History of colonic polyps   . HTN (hypertension)   . Hx of radiation therapy 03/07/13- 03/29/13   right breast 4250 cGy 17  sessions  . Hyperlipidemia   . Melanoma ([place])   . Metatarsal bone fracture 2014  . Obesity   . Osteoarthritis   . Palpitations   . Seasonal allergies   . Skin cancer   . Tremor, essential 08/18/2016  . Vitamin B12 deficiency     Past Surgical History:  Procedure Laterality Date  . ABDOMINAL HYSTERECTOMY    . BRAIN SURGERY    . BREAST BIOPSY Right 01/24/2013   Procedure: RE-EXCICION OF BREAST CANCER, ANTERIOR MARGINS;  Surgeon: Edward Jolly, MD;  Location: [facility];  Service: General;  Laterality: Right;  . BREAST LUMPECTOMY WITH NEEDLE LOCALIZATION Right 01/17/2013   Procedure: BREAST LUMPECTOMY WITH NEEDLE LOCALIZATION;  Surgeon: Edward Jolly, MD;  Location: [facility];  Service: General;  Laterality: Right;  . BUNIONECTOMY    . CATARACT EXTRACTION W/ INTRAOCULAR LENS IMPLANT Right   . CHOLECYSTECTOMY    . COLONOSCOPY    . CRANIOTOMY Right 10/18/2013   Procedure: CRANIOTOMY TUMOR EXCISION;  Surgeon: Floyce Stakes, MD;  Location: [facility];  Service: Neurosurgery;  Laterality: Right;  . EYE SURGERY    . HERNIA REPAIR    . KNEE ARTHROSCOPY Bilateral   . POLYPECTOMY     small adenomatous  . ULNAR  TUNNEL RELEASE      Social History   Social History  . Marital status: Widowed    Spouse name: N/A  . Number of children: 0  . Years of education: college   Occupational History  . retired    Social History Main Topics  . Smoking status: Never Smoker  . Smokeless tobacco: Never Used  . Alcohol use No  . Drug use: No  . Sexual activity: Yes     Comment: menarche age 19, hysterectomy age 50, HRT x 2-3 mos, G1- miscarriage   Other Topics Concern  . Not on file   Social History Narrative   Widowed, lives alone.  Ambulates independently.    Drinks decaf only   Patient is right handed.    Current Outpatient Prescriptions on File Prior to Visit  Medication Sig Dispense Refill  . acetaminophen (TYLENOL) 500 MG tablet Take 1,000 mg by mouth every 6 (six) hours  as needed for pain.    . ALPRAZolam (XANAX) 0.25 MG tablet Take 1 tablet (0.25 mg total) by mouth at bedtime. 90 tablet 1  . atorvastatin (LIPITOR) 20 MG tablet Take 1 tablet by mouth daily.    . Biotin 1000 MCG tablet Take 1,000 mcg by mouth daily.     . Blood Glucose Monitoring Suppl (PRODIGY VOICE BLOOD GLUCOSE) w/Device KIT Use to check blood sugar 1 time per day. 1 each 2  . calcitonin, salmon, (MIACALCIN/FORTICAL) 200 UNIT/ACT nasal spray Place 1 spray into alternate nostrils daily.    . Cholecalciferol 5000 units capsule Take 5,000 Units by mouth daily.    . colestipol (COLESTID) 1 G tablet Take 1-2 g by mouth 3 (three) times daily. Take 2 tablets in the morning, 1 tablet at noon, and 2 tablets in the evening    . cyanocobalamin (,VITAMIN B-12,) 1000 MCG/ML injection INJECT 1 ML INTRAMUSCULARLY EVERY 21 DAYS 3 mL 2  . escitalopram (LEXAPRO) 10 MG tablet Take 10 mg by mouth daily.    . ferrous sulfate 325 (65 FE) MG tablet Take 325 mg by mouth every other day.    . fexofenadine (ALLEGRA) 60 MG tablet Take 60 mg by mouth daily as needed for allergies.     . furosemide (LASIX) 20 MG tablet Take 1 tablet (20 mg total) by mouth daily. Do not take until I call you about labs. (Patient taking differently: Take 20 mg by mouth as needed. Do not take until I call you about labs.) 30 tablet 0  . glucose blood test strip Use to check blood sugar 1 time per day. 100 each 3  . JARDIANCE 25 MG TABS tablet Take 1 tablet by mouth daily.    . levothyroxine (SYNTHROID, LEVOTHROID) 50 MCG tablet TAKE 1 TABLET DAILY BEFORE BREAKFAST 90 tablet 1  . lidocaine (LIDODERM) 5 % Place 1 patch onto the skin daily. Remove & Discard patch within 12 hours or as directed by MD (Patient taking differently: Place 2 patches onto the skin daily as needed (pain). Remove & Discard patch within 12 hours or as directed by MD) 90 patch 1  . OVER THE COUNTER MEDICATION Take 1 capsule by mouth daily. Recall Max memory supplement    .  phenytoin (DILANTIN) 100 MG ER capsule TAKE 3 CAPSULES AT BEDTIME (Patient taking differently: TAKE 3 CAPSULES AT BEDTIME. 2 capsules at bedtime and 1 in the morning) 270 capsule 3  . PHENYTOIN INFATABS 50 MG tablet CHEW 1 TABLET DAILY 90 tablet 3  . Polyethyl Glycol-Propyl   Glycol 0.4-0.3 % SOLN Place 2 drops into both eyes at bedtime.     . ranitidine (ZANTAC) 150 MG capsule Take 150 mg by mouth daily as needed for heartburn.     . traMADol (ULTRAM) 50 MG tablet Take 50 mg by mouth every 6 (six) hours as needed.     . trandolapril-verapamil (TARKA) 2-240 MG tablet Take 1 tablet by mouth daily.     No current facility-administered medications on file prior to visit.     Allergies  Allergen Reactions  . Bystolic [Nebivolol Hcl] Other (See Comments)    "extreme weakness, heaviness in legs & arms, swelling in legs/arms/face, swollen abdomen, pain in bladder, feet pain, soreness in chest"  . Cholestyramine     "itching rash on stomach, bloated, nausea, vomiting, sleeplessness, extreme pain in arms"  . Hydrazine Yellow [Tartrazine] Other (See Comments)    "does not reduce high blood pressure, pain in arm, high pressure, felt like I was on verge of heart attack, really weak"  . Morphine Other (See Comments)    Feels morbid, weak, still in pain  . Niacin Palpitations    Fast heart beat  . Niaspan [Niacin Er] Palpitations and Other (See Comments)    "fast heart beat, high blood pressure"  . Norvasc [Amlodipine Besylate] Other (See Comments)    "extreme fluid retention/pain)  . Optivar [Azelastine Hcl] Photosensitivity  . Repaglinide Hives  . Sular [Nisoldipine Er] Other (See Comments)    "severe headaches, swelling eyes, hands, feet, shortness of breath, weak, flushed face, brain boiling, fluid retention, high blood sugar, nervous, heart fast beating"  . Tegretol [Carbamazepine] Other (See Comments)    Blood poisoning   . Telmisartan Other (See Comments)    "headache, difficulty urinating,  high blood sugar, fluid retention"  . Cefdinir Swelling    Vaginal irritation, breathing,   . Clonidine Other (See Comments)    Dry mouth, fluid retention  . Clonidine Hydrochloride Other (See Comments)    Dry mouth, fluid retention  . Codeine Nausea And Vomiting  . Ezetimibe Other (See Comments)    Made weak  . Naproxen Other (See Comments)    Shrinks bladder  . Ziac [Bisoprolol-Hydrochlorothiazide] Other (See Comments)    "stopped urination"  . Elavil [Amitriptyline] Other (See Comments)    Gave Pt nightmares  . Keppra [Levetiracetam] Other (See Comments)    Shaking  . Lamictal [Lamotrigine]     itching  . Lyrica [Pregabalin] Swelling  . Topamax [Topiramate]     Dry eyes  . Ace Inhibitors Other (See Comments)    unknown  . Actonel [Risedronate Sodium] Other (See Comments)    unknown  . Amlodipine Besylate Other (See Comments)    unknown  . Aspirin Other (See Comments)    unknown  . Atacand [Candesartan] Other (See Comments)    unknown  . Bextra [Valdecoxib] Other (See Comments)    unknown  . Bisoprolol-Hydrochlorothiazide Other (See Comments)    unknown  . Candesartan Cilexetil Other (See Comments)    unknown  . Cefadroxil Other (See Comments)    unknown  . Celecoxib Rash  . Hydrocodone Other (See Comments)    unknown  . Hydrocodone-Acetaminophen Other (See Comments)    unknown  . Iodinated Diagnostic Agents Rash    "All over"  . Meloxicam Other (See Comments)    unknown  . Methylprednisolone Sodium Succinate Other (See Comments)    unknown  . Nabumetone Other (See Comments)    unknown  . Penicillins Other (  See Comments)    unknown  . Pseudoephedrine-Guaifenesin Other (See Comments)    unknown  . Risedronate Sodium Other (See Comments)    unknown  . Rofecoxib Other (See Comments)    unknown  . Ru-Tuss [Chlorphen-Pse-Atrop-Hyos-Scop] Other (See Comments)    unknown  . Sulfonamide Derivatives Other (See Comments)    unknown  . Sulphur [Sulfur] Other  (See Comments)    unknown  . Telithromycin Other (See Comments)    unknown  . Terfenadine Other (See Comments)    unknown  . Trandolapril-Verapamil Hcl Er Other (See Comments)    Headache, difficulty urinating, high blood sugar, fluid retention  Pt is taking Tarka (trandolapril-verapamil) currently, but requests the medication stay in her allergy list  . Valium [Diazepam] Other (See Comments)    Makes her mean and hyper    Family History  Problem Relation Age of Onset  . Heart disease Mother   . Osteoporosis Mother   . Diabetes Father   . Pancreatic cancer Father   . Colon cancer Unknown   . Bone cancer Sister   . Prostate cancer Brother   . Colon cancer Brother   . Rectal cancer Sister   . Thyroid disease Sister        benign goiter resected    BP 130/82   Pulse 84   Wt 133 lb 3.2 oz (60.4 kg)   SpO2 94%   BMI 20.86 kg/m   Review of Systems She denies hypoglycemia    Objective:   Physical Exam Vital signs: see vs page Gen: elderly, frail, no distress Spine: no kyphosis Gait: normal and steady Pulses: foot pulses are intact bilaterally.   MSK: no deformity of the feet or ankles, except for hammer toes.  CV: no edema of the legs or ankles, but there are bilat vv's Skin:  no ulcer on the feet or ankles.  normal color and temp on the feet and ankles.  Neuro: sensation is intact to touch on the feet and ankles.     Lab Results  Component Value Date   HGBA1C 8.0 11/18/2016   25-OH vit-D=35     Assessment & Plan:  Osteoporosis: she needs reclast. Vit-D deficiency: well-replaced Type 2 DM, with PAD: she needs insulin.   Patient Instructions  Please change the basaglar and nateglinide to "humalog," 3-4 units 3 times a day (just before each meal).  For your safety, take this right before you eat, and only when you eat.   check your blood sugar twice a day.  vary the time of day when you check, between before the 3 meals, and at bedtime.  also check if you have  symptoms of your blood sugar being too high or too low.  please keep a record of the readings and bring it to your next appointment here (or you can bring the meter itself).  You can write it on any piece of paper.  please call us sooner if your blood sugar goes below 70, or if you have a lot of readings over 200. Please call or message [place] next week, to tell us how the blood sugar is doing.   Please continue the same jardiance for now.   Take calcium 1200 mg per day, and vitamin-D, 5000 units per day.   blood tests are requested for you today.  We'll let you know about the results.  Please have the "reclast" infusion, once a year.  you will receive a phone call, about a day and time  for an appointment Please come back for a follow-up appointment in 6 weeks.     

## 2016-11-19 ENCOUNTER — Other Ambulatory Visit: Payer: Self-pay

## 2016-11-19 MED ORDER — INSULIN PEN NEEDLE 32G X 4 MM MISC: 2 refills | Status: AC

## 2016-11-20 ENCOUNTER — Other Ambulatory Visit (INDEPENDENT_AMBULATORY_CARE_PROVIDER_SITE_OTHER): Payer: Medicare Other

## 2016-11-20 DIAGNOSIS — E1151 Type 2 diabetes mellitus with diabetic peripheral angiopathy without gangrene: Secondary | ICD-10-CM | POA: Diagnosis not present

## 2016-11-20 LAB — BASIC METABOLIC PANEL
BUN: 16 mg/dL (ref 6–23)
CO2: 24 mEq/L (ref 19–32)
Calcium: 8.8 mg/dL (ref 8.4–10.5)
Chloride: 103 mEq/L (ref 96–112)
Creatinine, Ser: 1.08 mg/dL (ref 0.40–1.20)
GFR: 51.29 mL/min — ABNORMAL LOW (ref >60.00)
Glucose, Bld: 323 mg/dL — ABNORMAL HIGH (ref 70–99)
Potassium: 4.2 mEq/L (ref 3.5–5.1)
Sodium: 137 mEq/L (ref 135–145)

## 2016-11-23 NOTE — Progress Notes (Signed)
Called left VM again about kidney function and reclast appointment.

## 2016-11-27 ENCOUNTER — Telehealth: Payer: Self-pay | Admitting: Endocrinology

## 2016-11-27 NOTE — Telephone Encounter (Signed)
Please increase the humalog to 5-6 units 3 times a day (just before each meal). Please call or message [place] next week, to tell us how the blood sugar is doing

## 2016-11-27 NOTE — Telephone Encounter (Signed)
Patient called in to advise that she does not think the new insulin is doing well.  11/26/2016 2:05- 306 8:15- 223  11/27/2016 Wake up- 215 4:30- 293  Verified home #

## 2016-11-30 NOTE — Telephone Encounter (Signed)
Patient came and spoke with our Diabetic educator on her insulin. Patient has no further questions at this time.

## 2016-11-30 NOTE — Telephone Encounter (Signed)
Patient unable to inject her humalog insulin. Supply [place] be defective.   Please advise.  Thank you.  -LL

## 2016-11-30 NOTE — Telephone Encounter (Signed)
Patient notified of MD's instructions and voiced understanding.

## 2016-12-03 ENCOUNTER — Telehealth: Payer: Self-pay

## 2016-12-03 ENCOUNTER — Telehealth: Payer: Self-pay | Admitting: Endocrinology

## 2016-12-03 NOTE — Telephone Encounter (Signed)
Patient called to speak to nurse directly to give her blood sugar readings to. Call patient to advise.

## 2016-12-03 NOTE — Telephone Encounter (Signed)
Called and informed patient of changes and she understood with no questions.

## 2016-12-03 NOTE — Telephone Encounter (Signed)
Returned patient's call on blood sugar readings.  Taking 4 units: 7/28: 9:20pm 162 10:45pm 247 7/29: 3:05pm 245 7:45pm 206 7/30: 8:45am 189 Taking 5 units: 7/31: 3:40pm 194  Taking 6 units: 8/1: 7:21am 172 12:35pm 224 4:00pm 277 8/2: 7:05am 170  Patient feels readings are better on 6 units but think dose needs to be higher to get numbers down. She also states she eats a large lunch & always has desert. There is desert at most meals. Advise?

## 2016-12-03 NOTE — Telephone Encounter (Signed)
Routing to you °

## 2016-12-03 NOTE — Telephone Encounter (Signed)
This is good progress.  We have learned that the humalog is the right insulin for you.  Please increase to 7 units 3 times a day (just before each meal).  Please call or message [place] next week, to tell us how the blood sugar is doing.

## 2016-12-03 NOTE — Telephone Encounter (Signed)
Returned patients call.

## 2016-12-08 ENCOUNTER — Ambulatory Visit: Payer: Medicare Other | Admitting: Podiatry

## 2016-12-08 DIAGNOSIS — Z85820 Personal history of malignant melanoma of skin: Secondary | ICD-10-CM | POA: Diagnosis not present

## 2016-12-08 DIAGNOSIS — L821 Other seborrheic keratosis: Secondary | ICD-10-CM | POA: Diagnosis not present

## 2016-12-08 DIAGNOSIS — L820 Inflamed seborrheic keratosis: Secondary | ICD-10-CM | POA: Diagnosis not present

## 2016-12-08 DIAGNOSIS — D225 Melanocytic nevi of trunk: Secondary | ICD-10-CM | POA: Diagnosis not present

## 2016-12-08 DIAGNOSIS — L814 Other melanin hyperpigmentation: Secondary | ICD-10-CM | POA: Diagnosis not present

## 2016-12-09 ENCOUNTER — Encounter: Payer: Self-pay | Admitting: Podiatry

## 2016-12-09 ENCOUNTER — Ambulatory Visit (INDEPENDENT_AMBULATORY_CARE_PROVIDER_SITE_OTHER): Payer: Medicare Other | Admitting: Podiatry

## 2016-12-09 DIAGNOSIS — B351 Tinea unguium: Secondary | ICD-10-CM | POA: Diagnosis not present

## 2016-12-09 DIAGNOSIS — M79675 Pain in left toe(s): Secondary | ICD-10-CM

## 2016-12-09 DIAGNOSIS — E1142 Type 2 diabetes mellitus with diabetic polyneuropathy: Secondary | ICD-10-CM | POA: Diagnosis not present

## 2016-12-09 DIAGNOSIS — L84 Corns and callosities: Secondary | ICD-10-CM

## 2016-12-09 DIAGNOSIS — M79674 Pain in right toe(s): Secondary | ICD-10-CM | POA: Diagnosis not present

## 2016-12-09 NOTE — Patient Instructions (Signed)
Diabetes and Foot Care Diabetes Varnell cause you to have problems because of poor blood supply (circulation) to your feet and legs. This Fellenz cause the skin on your feet to become thinner, break easier, and heal more slowly. Your skin Diers become dry, and the skin Biehn peel and crack. You Birr also have nerve damage in your legs and feet causing decreased feeling in them. You Rumple not notice minor injuries to your feet that could lead to infections or more serious problems. Taking care of your feet is one of the most important things you can do for yourself. Follow these instructions at home:  Wear shoes at all times, even in the house. Do not go barefoot. Bare feet are easily injured.  Check your feet daily for blisters, cuts, and redness. If you cannot see the bottom of your feet, use a mirror or ask someone for help.  Wash your feet with warm water (do not use hot water) and mild soap. Then pat your feet and the areas between your toes until they are completely dry. Do not soak your feet as this can dry your skin.  Apply a moisturizing lotion or petroleum jelly (that does not contain alcohol and is unscented) to the skin on your feet and to dry, brittle toenails. Do not apply lotion between your toes.  Trim your toenails straight across. Do not dig under them or around the cuticle. File the edges of your nails with an emery board or nail file.  Do not cut corns or calluses or try to remove them with medicine.  Wear clean socks or stockings every day. Make sure they are not too tight. Do not wear knee-high stockings since they Bidinger decrease blood flow to your legs.  Wear shoes that fit properly and have enough cushioning. To break in new shoes, wear them for just a few hours a day. This prevents you from injuring your feet. Always look in your shoes before you put them on to be sure there are no objects inside.  Do not cross your legs. This Melikian decrease the blood flow to your feet.  If you find a  minor scrape, cut, or break in the skin on your feet, keep it and the skin around it clean and dry. These areas Bolander be cleansed with mild soap and water. Do not cleanse the area with peroxide, alcohol, or iodine.  When you remove an adhesive bandage, be sure not to damage the skin around it.  If you have a wound, look at it several times a day to make sure it is healing.  Do not use heating pads or hot water bottles. They Lawson burn your skin. If you have lost feeling in your feet or legs, you Liggins not know it is happening until it is too late.  Make sure your health care provider performs a complete foot exam at least annually or more often if you have foot problems. Report any cuts, sores, or bruises to your health care provider immediately. Contact a health care provider if:  You have an injury that is not healing.  You have cuts or breaks in the skin.  You have an ingrown nail.  You notice redness on your legs or feet.  You feel burning or tingling in your legs or feet.  You have pain or cramps in your legs and feet.  Your legs or feet are numb.  Your feet always feel cold. Get help right away if:  There is increasing   redness, swelling, or pain in or around a wound.  There is a red line that goes up your leg.  Pus is coming from a wound.  You develop a fever or as directed by your health care provider.  You notice a bad smell coming from an ulcer or wound. This information is not intended to replace advice given to you by your health care provider. Make sure you discuss any questions you have with your health care provider. Document Released: 04/17/2000 Document Revised: 09/26/2015 Document Reviewed: 09/27/2012 Elsevier Interactive Patient Education  2017 [employer]  

## 2016-12-09 NOTE — Progress Notes (Signed)
Patient ID: [name], female   DOB: December 29, 1929, 82 y.o.   MRN: [id number]    Subjective: This diabetic patient presents today for follow-up care for debridement of mycotic toenails and evaluation of pre-ulcerative calluses on the right and left feet. Patient has history of ulceratiion recurrent ulcerations. Please note patient's primary care physician will not certify for diabetic shoes  Objective: Orientated 3 No peripheral edema or calf edema or calf tenderness bilaterally DP and PT pulses 2/4 bilaterally Capillary reflex immediate bilaterally  Neurological: Sensation to 10 g monofilament wire intact 1/5 right 0/5 left Vibratory sensation nonreactive bilaterally Ankle reflex equal and reactive bilaterally  Dermatological: No open skin lesions bilaterally Atrophic skin with absent hair growth bilaterally Plantar calluses first MPJ with bleeding within the callus, bilaterally Callus distal second left toe with bleeding within the callus Toenails are elongated, brittle, deformed, discolored 6-10  Musculoskeletal: Hammertoe 2-5 bilaterally  Assessment: Diabetic peripheral neuropathy Hammertoe deformities bilaterally Pre-ulcerative callus distal second toe left pre-ulcerative plantar callus first MPJ bilaterally Symptomatic mycotic toenails 6-10  Plan: Debridement of toenails 6-10 mechanically and electrically without any bleeding Debride all pre-ulcerative keratoses right and left without any bleeding Maintain silicone toe prop left foot Had a discussion with patient today about lack certification by patient's physician which prevents [place] from dispensing diabetic shoes. I provided patient again our standard form to present to patient's primary care physician to see if he would consider signing this  Reappoint 2 months

## 2016-12-16 ENCOUNTER — Telehealth: Payer: Self-pay | Admitting: *Deleted

## 2016-12-16 ENCOUNTER — Encounter: Payer: Self-pay | Admitting: *Deleted

## 2016-12-16 NOTE — Telephone Encounter (Signed)
Patient has been notified that plan of care-home care from [facility] is completed and up front for pick up.  Patient has been advised of the $50 fee and will pay at the time she picks up the paperwork.

## 2016-12-16 NOTE — Telephone Encounter (Signed)
Gave completed/signed form for alternative plan of care-home care from [facility] back to medical records to process for the patient.

## 2016-12-18 DIAGNOSIS — Z0289 Encounter for other administrative examinations: Secondary | ICD-10-CM

## 2016-12-29 ENCOUNTER — Encounter: Payer: Self-pay | Admitting: Endocrinology

## 2016-12-29 ENCOUNTER — Ambulatory Visit (INDEPENDENT_AMBULATORY_CARE_PROVIDER_SITE_OTHER): Payer: Medicare Other | Admitting: Endocrinology

## 2016-12-29 VITALS — BP 120/72 | HR 69 | Wt 134.2 lb

## 2016-12-29 DIAGNOSIS — E1151 Type 2 diabetes mellitus with diabetic peripheral angiopathy without gangrene: Secondary | ICD-10-CM | POA: Diagnosis not present

## 2016-12-29 DIAGNOSIS — I70235 Atherosclerosis of native arteries of right leg with ulceration of other part of foot: Secondary | ICD-10-CM | POA: Diagnosis not present

## 2016-12-29 NOTE — Patient Instructions (Addendum)
check your blood sugar twice a day.  vary the time of day when you check, between before the 3 meals, and at bedtime.  also check if you have symptoms of your blood sugar being too high or too low.  please keep a record of the readings and bring it to your next appointment here (or you can bring the meter itself).  You can write it on any piece of paper.  please call us sooner if your blood sugar goes below 70, or if you have a lot of readings over 200.     Take calcium 1200 mg per day, and vitamin-D, 5000 units per day.   Please have the "reclast" infusion.  you will receive a phone call, about a day and time for an appointment.   Please continue the same humalog and jardiance.   Please come back for a follow-up appointment in 3 months.    In my opinion, you do qualify for diabetic shoes.  I'll do the form when I receive.

## 2016-12-29 NOTE — Progress Notes (Signed)
 Subjective:    Patient ID: [name], female    DOB: 04/17/1930, 83 y.o.   MRN: [id number]  HPI Pt returns for f/u of osteoporosis (dx'ed 2017; he has never been on medication for this; she has had these bony fractures: left foot, T-spine, right wrist, and right humerus; these happens with seizures or other falls).   Pt also returns for f/u of diabetes mellitus:   DM type: 2 (but lean body habitus suggests she is developing type 1).   Dx'ed: 1995 Complications: polyneuropathy and PAD.   Therapy: Jardiance and humalog (insulin since 2018).  DKA: never.    Severe hypoglycemia: never.  Pancreatitis: never.  Other: pt says she wants brand-name starlix, as the generic tab resembles another med; she declines prandin; she can tolerate metformin-XR only 1000/d--she gets diarrhea if she takes any more; she had to d/c pioglitizone, due to edema; she declines januvia, due to fear of cancer.   Interval history: she brings a record of her cbg's which I have reviewed today.  It varies from 80-300, but most are in the 100's.  For a week, she took starlix instead of jardiance, and says it worked better.  pt states she feels well in general.   She takes vit-D, 5000 units qd.  No recent falls.   Past Medical History:  Diagnosis Date  . Anemia   . Anxiety   . Cancer of right breast (HCC) 12/26/12   right breast 12:00 o'clock, DCIS  . Chronic cough   . Chronic facial pain   . Chronic foot pain   . Complication of anesthesia    Sore jaw; could not chew or move mouth  . Convulsions/seizures (HCC) 10/16/2014  . Diabetes mellitus    type 2 niddm x 20 years  . Dyslipidemia   . Ejection fraction   . GERD (gastroesophageal reflux disease)   . Hammer toe    bilateral  . History of colonic polyps   . HTN (hypertension)   . Hx of radiation therapy 03/07/13- 03/29/13   right breast 4250 cGy 17 sessions  . Hyperlipidemia   . Melanoma (HCC)   . Metatarsal bone fracture 2014  . Obesity   . Osteoarthritis     . Palpitations   . Seasonal allergies   . Skin cancer   . Tremor, essential 08/18/2016  . Vitamin B12 deficiency     Past Surgical History:  Procedure Laterality Date  . ABDOMINAL HYSTERECTOMY    . BRAIN SURGERY    . BREAST BIOPSY Right 01/24/2013   Procedure: RE-EXCICION OF BREAST CANCER, ANTERIOR MARGINS;  Surgeon: Benjamin T Hoxworth, MD;  Location: [facility];  Service: General;  Laterality: Right;  . BREAST LUMPECTOMY WITH NEEDLE LOCALIZATION Right 01/17/2013   Procedure: BREAST LUMPECTOMY WITH NEEDLE LOCALIZATION;  Surgeon: Benjamin T Hoxworth, MD;  Location: [facility];  Service: General;  Laterality: Right;  . BUNIONECTOMY    . CATARACT EXTRACTION W/ INTRAOCULAR LENS IMPLANT Right   . CHOLECYSTECTOMY    . COLONOSCOPY    . CRANIOTOMY Right 10/18/2013   Procedure: CRANIOTOMY TUMOR EXCISION;  Surgeon: Ernesto M Botero, MD;  Location: [facility];  Service: Neurosurgery;  Laterality: Right;  . EYE SURGERY    . HERNIA REPAIR    . KNEE ARTHROSCOPY Bilateral   . POLYPECTOMY     small adenomatous  . ULNAR TUNNEL RELEASE      Social History   Social History  . Marital status: Widowed    Spouse name:   N/A  . Number of children: 0  . Years of education: college   Occupational History  . retired    Social History Main Topics  . Smoking status: Never Smoker  . Smokeless tobacco: Never Used  . Alcohol use No  . Drug use: No  . Sexual activity: Yes     Comment: menarche age 19, hysterectomy age 50, HRT x 2-3 mos, G1- miscarriage   Other Topics Concern  . Not on file   Social History Narrative   Widowed, lives alone.  Ambulates independently.    Drinks decaf only   Patient is right handed.    Current Outpatient Prescriptions on File Prior to Visit  Medication Sig Dispense Refill  . acetaminophen (TYLENOL) 500 MG tablet Take 1,000 mg by mouth every 6 (six) hours as needed for pain.    . ALPRAZolam (XANAX) 0.25 MG tablet Take 1 tablet (0.25 mg total) by mouth at bedtime. 90  tablet 1  . atorvastatin (LIPITOR) 20 MG tablet Take 1 tablet by mouth daily.    . Biotin 1000 MCG tablet Take 1,000 mcg by mouth daily.     . Blood Glucose Monitoring Suppl (PRODIGY VOICE BLOOD GLUCOSE) w/Device KIT Use to check blood sugar 1 time per day. 1 each 2  . calcitonin, salmon, (MIACALCIN/FORTICAL) 200 UNIT/ACT nasal spray Place 1 spray into alternate nostrils daily.    . Cholecalciferol 5000 units capsule Take 5,000 Units by mouth daily.    . colestipol (COLESTID) 1 G tablet Take 1-2 g by mouth 3 (three) times daily. Take 2 tablets in the morning, 1 tablet at noon, and 2 tablets in the evening    . cyanocobalamin (,VITAMIN B-12,) 1000 MCG/ML injection INJECT 1 ML INTRAMUSCULARLY EVERY 21 DAYS 3 mL 2  . escitalopram (LEXAPRO) 10 MG tablet Take 10 mg by mouth daily.    . ferrous sulfate 325 (65 FE) MG tablet Take 325 mg by mouth every other day.    . fexofenadine (ALLEGRA) 60 MG tablet Take 60 mg by mouth daily as needed for allergies.     . furosemide (LASIX) 20 MG tablet Take 1 tablet (20 mg total) by mouth daily. Do not take until I call you about labs. (Patient taking differently: Take 20 mg by mouth as needed. Do not take until I call you about labs.) 30 tablet 0  . glucose blood test strip Use to check blood sugar 1 time per day. 100 each 3  . insulin lispro (HUMALOG KWIKPEN) 100 UNIT/ML KiwkPen Inject 0.03-0.04 mLs (3-4 Units total) into the skin 3 (three) times daily with meals. And pen needles 1/day (Patient taking differently: Inject 7 Units into the skin 3 (three) times daily with meals. And pen needles 1/day) 15 mL 11  . Insulin Pen Needle 32G X 4 MM MISC Used to inject insulin 3x daily 270 each 2  . JARDIANCE 25 MG TABS tablet Take 1 tablet by mouth daily.    . levothyroxine (SYNTHROID, LEVOTHROID) 50 MCG tablet TAKE 1 TABLET DAILY BEFORE BREAKFAST 90 tablet 1  . lidocaine (LIDODERM) 5 % Place 1 patch onto the skin daily. Remove & Discard patch within 12 hours or as directed  by MD (Patient taking differently: Place 2 patches onto the skin daily as needed (pain). Remove & Discard patch within 12 hours or as directed by MD) 90 patch 1  . OVER THE COUNTER MEDICATION Take 1 capsule by mouth daily. Recall Max memory supplement    . phenytoin (DILANTIN) 100   MG ER capsule TAKE 3 CAPSULES AT BEDTIME (Patient taking differently: TAKE 3 CAPSULES AT BEDTIME. 2 capsules at bedtime and 1 in the morning) 270 capsule 3  . PHENYTOIN INFATABS 50 MG tablet CHEW 1 TABLET DAILY 90 tablet 3  . Polyethyl Glycol-Propyl Glycol 0.4-0.3 % SOLN Place 2 drops into both eyes at bedtime.     . ranitidine (ZANTAC) 150 MG capsule Take 150 mg by mouth daily as needed for heartburn.     . traMADol (ULTRAM) 50 MG tablet Take 50 mg by mouth every 6 (six) hours as needed.     . trandolapril-verapamil (TARKA) 2-240 MG tablet Take 1 tablet by mouth daily.     No current facility-administered medications on file prior to visit.     Allergies  Allergen Reactions  . Bystolic [Nebivolol Hcl] Other (See Comments)    "extreme weakness, heaviness in legs & arms, swelling in legs/arms/face, swollen abdomen, pain in bladder, feet pain, soreness in chest"  . Cholestyramine     "itching rash on stomach, bloated, nausea, vomiting, sleeplessness, extreme pain in arms"  . Hydrazine Yellow [Tartrazine] Other (See Comments)    "does not reduce high blood pressure, pain in arm, high pressure, felt like I was on verge of heart attack, really weak"  . Morphine Other (See Comments)    Feels morbid, weak, still in pain  . Niacin Palpitations    Fast heart beat  . Niaspan [Niacin Er] Palpitations and Other (See Comments)    "fast heart beat, high blood pressure"  . Norvasc [Amlodipine Besylate] Other (See Comments)    "extreme fluid retention/pain)  . Optivar [Azelastine Hcl] Photosensitivity  . Repaglinide Hives  . Sular [Nisoldipine Er] Other (See Comments)    "severe headaches, swelling eyes, hands, feet,  shortness of breath, weak, flushed face, brain boiling, fluid retention, high blood sugar, nervous, heart fast beating"  . Tegretol [Carbamazepine] Other (See Comments)    Blood poisoning   . Telmisartan Other (See Comments)    "headache, difficulty urinating, high blood sugar, fluid retention"  . Cefdinir Swelling    Vaginal irritation, breathing,   . Clonidine Other (See Comments)    Dry mouth, fluid retention  . Clonidine Hydrochloride Other (See Comments)    Dry mouth, fluid retention  . Codeine Nausea And Vomiting  . Ezetimibe Other (See Comments)    Made weak  . Naproxen Other (See Comments)    Shrinks bladder  . Ziac [Bisoprolol-Hydrochlorothiazide] Other (See Comments)    "stopped urination"  . Elavil [Amitriptyline] Other (See Comments)    Gave Pt nightmares  . Keppra [Levetiracetam] Other (See Comments)    Shaking  . Lamictal [Lamotrigine]     itching  . Lyrica [Pregabalin] Swelling  . Topamax [Topiramate]     Dry eyes  . Ace Inhibitors Other (See Comments)    unknown  . Actonel [Risedronate Sodium] Other (See Comments)    unknown  . Amlodipine Besylate Other (See Comments)    unknown  . Aspirin Other (See Comments)    unknown  . Atacand [Candesartan] Other (See Comments)    unknown  . Bextra [Valdecoxib] Other (See Comments)    unknown  . Bisoprolol-Hydrochlorothiazide Other (See Comments)    unknown  . Candesartan Cilexetil Other (See Comments)    unknown  . Cefadroxil Other (See Comments)    unknown  . Celecoxib Rash  . Hydrocodone Other (See Comments)    unknown  . Hydrocodone-Acetaminophen Other (See Comments)    unknown  .  Iodinated Diagnostic Agents Rash    "All over"  . Meloxicam Other (See Comments)    unknown  . Methylprednisolone Sodium Succinate Other (See Comments)    unknown  . Nabumetone Other (See Comments)    unknown  . Penicillins Other (See Comments)    unknown  . Pseudoephedrine-Guaifenesin Other (See Comments)    unknown  .  Risedronate Sodium Other (See Comments)    unknown  . Rofecoxib Other (See Comments)    unknown  . Ru-Tuss [Chlorphen-Pse-Atrop-Hyos-Scop] Other (See Comments)    unknown  . Sulfonamide Derivatives Other (See Comments)    unknown  . Sulphur [Sulfur] Other (See Comments)    unknown  . Telithromycin Other (See Comments)    unknown  . Terfenadine Other (See Comments)    unknown  . Trandolapril-Verapamil Hcl Er Other (See Comments)    Headache, difficulty urinating, high blood sugar, fluid retention  Pt is taking Tarka (trandolapril-verapamil) currently, but requests the medication stay in her allergy list  . Valium [Diazepam] Other (See Comments)    Makes her mean and hyper    Family History  Problem Relation Age of Onset  . Heart disease Mother   . Osteoporosis Mother   . Diabetes Father   . Pancreatic cancer Father   . Colon cancer Unknown   . Bone cancer Sister   . Prostate cancer Brother   . Colon cancer Brother   . Rectal cancer Sister   . Thyroid disease Sister        benign goiter resected    BP 120/72   Pulse 69   Wt 134 lb 3.2 oz (60.9 kg)   SpO2 96%   BMI 21.02 kg/m    Review of Systems She denies hypoglycemia.      Objective:   Physical Exam Vital signs: see vs page.   Gen: elderly, frail, no distress.   Spine: no kyphosis.   Gait: normal and steady.   Pulses: foot pulses are intact bilaterally.   MSK: no deformity of the feet or ankles, except for hammer toes.   CV: no edema of the legs or ankles, but there are bilat vv's.   Skin:  no ulcer on the feet or ankles.  normal color and temp on the feet and ankles.   Neuro: sensation is intact to touch on the feet and ankles.    Lab Results  Component Value Date   HGBA1C 8.0 11/18/2016   Lab Results  Component Value Date   CREATININE 1.08 11/20/2016   BUN 16 11/20/2016   NA 137 11/20/2016   K 4.2 11/20/2016   CL 103 11/20/2016   CO2 24 11/20/2016       Assessment & Plan:    Insulin-requiring type 2 DM, with PAD: she needs increased rx, if it can be done with a regimen that avoids or minimizes hypoglycemia.  I explained that starlix and jardiance are redundant.  She agrees to d/c starlix.  We discussed DM rx options, and the fact that she has many strong feelings about which medications she should take.    Patient Instructions  check your blood sugar twice a day.  vary the time of day when you check, between before the 3 meals, and at bedtime.  also check if you have symptoms of your blood sugar being too high or too low.  please keep a record of the readings and bring it to your next appointment here (or you can bring the meter itself).  You can   write it on any piece of paper.  please call us sooner if your blood sugar goes below 70, or if you have a lot of readings over 200.     Take calcium 1200 mg per day, and vitamin-D, 5000 units per day.   Please have the "reclast" infusion.  you will receive a phone call, about a day and time for an appointment.   Please continue the same humalog and jardiance.   Please come back for a follow-up appointment in 3 months.    In my opinion, you do qualify for diabetic shoes.  I'll do the form when I receive.      

## 2017-01-05 DIAGNOSIS — E119 Type 2 diabetes mellitus without complications: Secondary | ICD-10-CM | POA: Diagnosis not present

## 2017-01-05 DIAGNOSIS — H524 Presbyopia: Secondary | ICD-10-CM | POA: Diagnosis not present

## 2017-01-05 LAB — HM DIABETES EYE EXAM

## 2017-01-11 DIAGNOSIS — M546 Pain in thoracic spine: Secondary | ICD-10-CM | POA: Diagnosis not present

## 2017-01-12 ENCOUNTER — Ambulatory Visit (INDEPENDENT_AMBULATORY_CARE_PROVIDER_SITE_OTHER): Payer: Medicare Other | Admitting: Endocrinology

## 2017-01-12 DIAGNOSIS — M810 Age-related osteoporosis without current pathological fracture: Secondary | ICD-10-CM | POA: Diagnosis not present

## 2017-01-13 NOTE — Patient Instructions (Signed)
Drink at least 4 glasses of water today. Continue taking Vitamin D as directed by Dr. Loanne Drilling Call if questions.

## 2017-01-13 NOTE — Progress Notes (Signed)
Per Dr. Cordelia Pen note on 12/29/16, and after discussion of what Reclast does, side effects and type of administration, the patient signed off on the consent for receiving Reclast.  An IV was started in left antecubital space with a 20G needle.  An IV of normal saline was infused to verify patency of IV.  The IV of Reclast was then started at 10:45 and infused for 30 min.  Pt. Is allergic to many drugs, and so this was infused slowly to make sure patient had no complaints.  There were no complaints of any kind during the infusion.  After infusion of 5mg . The IV was flushed with normal saline and discontinued.  The site showed no signes of redness or swelling.  She was  Encouraged to drink at least 4 glasses of water today, and to continue her osteoporosis medications as prescribe by Dr. Loanne Drilling.  She had no final questions.

## 2017-01-15 ENCOUNTER — Encounter: Payer: Self-pay | Admitting: Endocrinology

## 2017-01-18 ENCOUNTER — Telehealth: Payer: Self-pay | Admitting: Endocrinology

## 2017-01-18 NOTE — Telephone Encounter (Signed)
Patient states that the reclast injection has caused her to have really painful urination. She states that AZO is not helping a lot and she can barely make it to the bathroom. Can something be called in to help her symptoms? She states that she has a caregiver with her until 3 pm and would like them to go to the pharmacy and pick something up before she leaves. [facility] [place].

## 2017-01-18 NOTE — Telephone Encounter (Signed)
Ov tomorrow 10:15 AM

## 2017-01-18 NOTE — Telephone Encounter (Signed)
Called patient & made appt for tomorrow.

## 2017-01-18 NOTE — Telephone Encounter (Signed)
Please advise 

## 2017-01-18 NOTE — Telephone Encounter (Signed)
Can you please advise the patient on this? Thank you !

## 2017-01-18 NOTE — Telephone Encounter (Signed)
Patient requesting a call to discuss the reclast injection. Call patient to advise, okay to leave a detailed message.

## 2017-01-19 ENCOUNTER — Ambulatory Visit (INDEPENDENT_AMBULATORY_CARE_PROVIDER_SITE_OTHER): Payer: Medicare Other | Admitting: Endocrinology

## 2017-01-19 ENCOUNTER — Encounter: Payer: Self-pay | Admitting: Endocrinology

## 2017-01-19 VITALS — BP 124/70 | HR 89 | Wt 131.6 lb

## 2017-01-19 DIAGNOSIS — I70235 Atherosclerosis of native arteries of right leg with ulceration of other part of foot: Secondary | ICD-10-CM

## 2017-01-19 DIAGNOSIS — E1151 Type 2 diabetes mellitus with diabetic peripheral angiopathy without gangrene: Secondary | ICD-10-CM | POA: Diagnosis not present

## 2017-01-19 DIAGNOSIS — R300 Dysuria: Secondary | ICD-10-CM | POA: Diagnosis not present

## 2017-01-19 DIAGNOSIS — Z0289 Encounter for other administrative examinations: Secondary | ICD-10-CM

## 2017-01-19 LAB — POCT URINALYSIS DIPSTICK
Bilirubin, UA: NEGATIVE
Glucose, UA: 2000
Ketones, UA: NEGATIVE
Nitrite, UA: NEGATIVE
Protein, UA: NEGATIVE
Spec Grav, UA: 1.01 (ref 1.010–1.025)
Urobilinogen, UA: 0.2 E.U./dL
pH, UA: 6 (ref 5.0–8.0)

## 2017-01-19 LAB — POCT GLYCOSYLATED HEMOGLOBIN (HGB A1C): Hemoglobin A1C: 7.2

## 2017-01-19 MED ORDER — PHENAZOPYRIDINE HCL 100 MG PO TABS: 100.0000 mg | ORAL_TABLET | Freq: Three times a day (TID) | ORAL | 0 refills | Status: DC | PRN

## 2017-01-19 NOTE — Patient Instructions (Addendum)
We are checking a urine culture.   I have sent a prescription to your pharmacy, for the pyridium medication. Please ask Dr. Ernie Hew if the symptoms persist for 2 more days.

## 2017-01-19 NOTE — Progress Notes (Signed)
Subjective:    Patient ID: [name], female    DOB: October 21, 1929, 83 y.o.   MRN: [id number]  HPI Pt states 5 days of moderate pain at the urethra, in the context of urination, and assoc generalized itching.   Past Medical History:  Diagnosis Date  . Anemia   . Anxiety   . Cancer of right breast ([place]) 12/26/12   right breast 12:00 o'clock, DCIS  . Chronic cough   . Chronic facial pain   . Chronic foot pain   . Complication of anesthesia    Sore jaw; could not chew or move mouth  . Convulsions/seizures ([place]) 10/16/2014  . Diabetes mellitus    type 2 niddm x 20 years  . Dyslipidemia   . Ejection fraction   . GERD (gastroesophageal reflux disease)   . Hammer toe    bilateral  . History of colonic polyps   . HTN (hypertension)   . Hx of radiation therapy 03/07/13- 03/29/13   right breast 4250 cGy 17 sessions  . Hyperlipidemia   . Melanoma ([place])   . Metatarsal bone fracture 2014  . Obesity   . Osteoarthritis   . Palpitations   . Seasonal allergies   . Skin cancer   . Tremor, essential 08/18/2016  . Vitamin B12 deficiency     Past Surgical History:  Procedure Laterality Date  . ABDOMINAL HYSTERECTOMY    . BRAIN SURGERY    . BREAST BIOPSY Right 01/24/2013   Procedure: RE-EXCICION OF BREAST CANCER, ANTERIOR MARGINS;  Surgeon: Edward Jolly, MD;  Location: [facility];  Service: General;  Laterality: Right;  . BREAST LUMPECTOMY WITH NEEDLE LOCALIZATION Right 01/17/2013   Procedure: BREAST LUMPECTOMY WITH NEEDLE LOCALIZATION;  Surgeon: Edward Jolly, MD;  Location: [facility];  Service: General;  Laterality: Right;  . BUNIONECTOMY    . CATARACT EXTRACTION W/ INTRAOCULAR LENS IMPLANT Right   . CHOLECYSTECTOMY    . COLONOSCOPY    . CRANIOTOMY Right 10/18/2013   Procedure: CRANIOTOMY TUMOR EXCISION;  Surgeon: Floyce Stakes, MD;  Location: [facility];  Service: Neurosurgery;  Laterality: Right;  . EYE SURGERY    . HERNIA REPAIR    . KNEE ARTHROSCOPY Bilateral   .  POLYPECTOMY     small adenomatous  . ULNAR TUNNEL RELEASE      Social History   Social History  . Marital status: Widowed    Spouse name: N/A  . Number of children: 0  . Years of education: college   Occupational History  . retired    Social History Main Topics  . Smoking status: Never Smoker  . Smokeless tobacco: Never Used  . Alcohol use No  . Drug use: No  . Sexual activity: Yes     Comment: menarche age 6, hysterectomy age 13, HRT x 2-3 mos, G1- miscarriage   Other Topics Concern  . Not on file   Social History Narrative   Widowed, lives alone.  Ambulates independently.    Drinks decaf only   Patient is right handed.    Current Outpatient Prescriptions on File Prior to Visit  Medication Sig Dispense Refill  . acetaminophen (TYLENOL) 500 MG tablet Take 1,000 mg by mouth every 6 (six) hours as needed for pain.    Marland Kitchen ALPRAZolam (XANAX) 0.25 MG tablet Take 1 tablet (0.25 mg total) by mouth at bedtime. 90 tablet 1  . atorvastatin (LIPITOR) 20 MG tablet Take 1 tablet by mouth daily.    . Biotin  1000 MCG tablet Take 1,000 mcg by mouth daily.     . Blood Glucose Monitoring Suppl (PRODIGY VOICE BLOOD GLUCOSE) w/Device KIT Use to check blood sugar 1 time per day. 1 each 2  . calcitonin, salmon, (MIACALCIN/FORTICAL) 200 UNIT/ACT nasal spray Place 1 spray into alternate nostrils daily.    . Cholecalciferol 5000 units capsule Take 5,000 Units by mouth daily.    . colestipol (COLESTID) 1 G tablet Take 1-2 g by mouth 3 (three) times daily. Take 2 tablets in the morning, 1 tablet at noon, and 2 tablets in the evening    . cyanocobalamin (,VITAMIN B-12,) 1000 MCG/ML injection INJECT 1 ML INTRAMUSCULARLY EVERY 21 DAYS 3 mL 2  . escitalopram (LEXAPRO) 10 MG tablet Take 10 mg by mouth daily.    . ferrous sulfate 325 (65 FE) MG tablet Take 325 mg by mouth every other day.    . fexofenadine (ALLEGRA) 60 MG tablet Take 60 mg by mouth daily as needed for allergies.     . furosemide (LASIX)  20 MG tablet Take 1 tablet (20 mg total) by mouth daily. Do not take until I call you about labs. (Patient taking differently: Take 20 mg by mouth as needed. Do not take until I call you about labs.) 30 tablet 0  . glucose blood test strip Use to check blood sugar 1 time per day. 100 each 3  . insulin lispro (HUMALOG KWIKPEN) 100 UNIT/ML KiwkPen Inject 0.03-0.04 mLs (3-4 Units total) into the skin 3 (three) times daily with meals. And pen needles 1/day (Patient taking differently: Inject 7 Units into the skin 3 (three) times daily with meals. And pen needles 1/day) 15 mL 11  . Insulin Pen Needle 32G X 4 MM MISC Used to inject insulin 3x daily 270 each 2  . JARDIANCE 25 MG TABS tablet Take 1 tablet by mouth daily.    Marland Kitchen levothyroxine (SYNTHROID, LEVOTHROID) 50 MCG tablet TAKE 1 TABLET DAILY BEFORE BREAKFAST 90 tablet 1  . lidocaine (LIDODERM) 5 % Place 1 patch onto the skin daily. Remove & Discard patch within 12 hours or as directed by MD (Patient taking differently: Place 2 patches onto the skin daily as needed (pain). Remove & Discard patch within 12 hours or as directed by MD) 90 patch 1  . OVER THE COUNTER MEDICATION Take 1 capsule by mouth daily. Recall Max memory supplement    . phenytoin (DILANTIN) 100 MG ER capsule TAKE 3 CAPSULES AT BEDTIME (Patient taking differently: TAKE 3 CAPSULES AT BEDTIME. 2 capsules at bedtime and 1 in the morning) 270 capsule 3  . PHENYTOIN INFATABS 50 MG tablet CHEW 1 TABLET DAILY 90 tablet 3  . Polyethyl Glycol-Propyl Glycol 0.4-0.3 % SOLN Place 2 drops into both eyes at bedtime.     . ranitidine (ZANTAC) 150 MG capsule Take 150 mg by mouth daily as needed for heartburn.     . traMADol (ULTRAM) 50 MG tablet Take 50 mg by mouth every 6 (six) hours as needed.     . trandolapril-verapamil (TARKA) 2-240 MG tablet Take 1 tablet by mouth daily.     No current facility-administered medications on file prior to visit.     Allergies  Allergen Reactions  . Bystolic  [Nebivolol Hcl] Other (See Comments)    "extreme weakness, heaviness in legs & arms, swelling in legs/arms/face, swollen abdomen, pain in bladder, feet pain, soreness in chest"  . Cholestyramine     "itching rash on stomach, bloated, nausea, vomiting, sleeplessness,  extreme pain in arms"  . Hydrazine Yellow [Tartrazine] Other (See Comments)    "does not reduce high blood pressure, pain in arm, high pressure, felt like I was on verge of heart attack, really weak"  . Morphine Other (See Comments)    Feels morbid, weak, still in pain  . Niacin Palpitations    Fast heart beat  . Niaspan [Niacin Er] Palpitations and Other (See Comments)    "fast heart beat, high blood pressure"  . Norvasc [Amlodipine Besylate] Other (See Comments)    "extreme fluid retention/pain)  . Optivar [Azelastine Hcl] Photosensitivity  . Repaglinide Hives  . Sular [Nisoldipine Er] Other (See Comments)    "severe headaches, swelling eyes, hands, feet, shortness of breath, weak, flushed face, brain boiling, fluid retention, high blood sugar, nervous, heart fast beating"  . Tegretol [Carbamazepine] Other (See Comments)    Blood poisoning   . Telmisartan Other (See Comments)    "headache, difficulty urinating, high blood sugar, fluid retention"  . Cefdinir Swelling    Vaginal irritation, breathing,   . Clonidine Other (See Comments)    Dry mouth, fluid retention  . Clonidine Hydrochloride Other (See Comments)    Dry mouth, fluid retention  . Codeine Nausea And Vomiting  . Ezetimibe Other (See Comments)    Made weak  . Naproxen Other (See Comments)    Shrinks bladder  . Ziac [Bisoprolol-Hydrochlorothiazide] Other (See Comments)    "stopped urination"  . Elavil [Amitriptyline] Other (See Comments)    Gave Pt nightmares  . Keppra [Levetiracetam] Other (See Comments)    Shaking  . Lamictal [Lamotrigine]     itching  . Lyrica [Pregabalin] Swelling  . Topamax [Topiramate]     Dry eyes  . Ace Inhibitors Other (See  Comments)    unknown  . Actonel [Risedronate Sodium] Other (See Comments)    unknown  . Amlodipine Besylate Other (See Comments)    unknown  . Aspirin Other (See Comments)    unknown  . Atacand [Candesartan] Other (See Comments)    unknown  . Bextra [Valdecoxib] Other (See Comments)    unknown  . Bisoprolol-Hydrochlorothiazide Other (See Comments)    unknown  . Candesartan Cilexetil Other (See Comments)    unknown  . Cefadroxil Other (See Comments)    unknown  . Celecoxib Rash  . Hydrocodone Other (See Comments)    unknown  . Hydrocodone-Acetaminophen Other (See Comments)    unknown  . Iodinated Diagnostic Agents Rash    "All over"  . Meloxicam Other (See Comments)    unknown  . Methylprednisolone Sodium Succinate Other (See Comments)    unknown  . Nabumetone Other (See Comments)    unknown  . Penicillins Other (See Comments)    unknown  . Pseudoephedrine-Guaifenesin Other (See Comments)    unknown  . Risedronate Sodium Other (See Comments)    unknown  . Rofecoxib Other (See Comments)    unknown  . Ru-Tuss [Chlorphen-Pse-Atrop-Hyos-Scop] Other (See Comments)    unknown  . Sulfonamide Derivatives Other (See Comments)    unknown  . Sulphur [Sulfur] Other (See Comments)    unknown  . Telithromycin Other (See Comments)    unknown  . Terfenadine Other (See Comments)    unknown  . Trandolapril-Verapamil Hcl Er Other (See Comments)    Headache, difficulty urinating, high blood sugar, fluid retention  Pt is taking Tarka (trandolapril-verapamil) currently, but requests the medication stay in her allergy list  . Valium [Diazepam] Other (See Comments)    Makes  her mean and hyper    Family History  Problem Relation Age of Onset  . Heart disease Mother   . Osteoporosis Mother   . Diabetes Father   . Pancreatic cancer Father   . Colon cancer Unknown   . Bone cancer Sister   . Prostate cancer Brother   . Colon cancer Brother   . Rectal cancer Sister   . Thyroid  disease Sister        benign goiter resected    BP 124/70   Pulse 89   Wt 131 lb 9.6 oz (59.7 kg)   SpO2 96%   BMI 20.61 kg/m    Review of Systems Denies rash and fever    Objective:   Physical Exam VITAL SIGNS:  See vs page GENERAL: no distress Skin: no rash is seen      Assessment & Plan:  Dysuria, new, uncertain etiology.  Very unlikely due to reclast Mult drug allergies: these limit rx options if there is UTI, so we'll check urine c/s  Patient Instructions  We are checking a urine culture.   I have sent a prescription to your pharmacy, for the pyridium medication. Please ask Dr. Ernie Hew if the symptoms persist for 2 more days.

## 2017-01-21 LAB — CULTURE, URINE COMPREHENSIVE
MICRO NUMBER:: 81029871
SPECIMEN QUALITY:: ADEQUATE

## 2017-01-22 NOTE — Progress Notes (Signed)
Tried to call patient but received no answer.

## 2017-01-25 ENCOUNTER — Telehealth: Payer: Self-pay | Admitting: Endocrinology

## 2017-01-25 DIAGNOSIS — Z23 Encounter for immunization: Secondary | ICD-10-CM | POA: Diagnosis not present

## 2017-01-25 DIAGNOSIS — N390 Urinary tract infection, site not specified: Secondary | ICD-10-CM | POA: Diagnosis not present

## 2017-01-25 DIAGNOSIS — L299 Pruritus, unspecified: Secondary | ICD-10-CM | POA: Diagnosis not present

## 2017-01-25 DIAGNOSIS — T50905D Adverse effect of unspecified drugs, medicaments and biological substances, subsequent encounter: Secondary | ICD-10-CM | POA: Diagnosis not present

## 2017-01-25 NOTE — Telephone Encounter (Signed)
Called and spoke with patient. Her PCP prescribed her an antibiotic for UTI.

## 2017-01-25 NOTE — Telephone Encounter (Signed)
Patient calling to get lab results. Patient also wants her office visit notes sent to Dr. Rachell Cipro, PCP before her 11:00am appointment this morning with her due to not feeling better from her last visit with Dr. Loanne Drilling.

## 2017-01-26 ENCOUNTER — Telehealth: Payer: Self-pay | Admitting: Endocrinology

## 2017-01-26 ENCOUNTER — Other Ambulatory Visit: Payer: Self-pay

## 2017-01-26 MED ORDER — INSULIN LISPRO 100 UNIT/ML (KWIKPEN): 3.0000 [IU] | PEN_INJECTOR | Freq: Three times a day (TID) | SUBCUTANEOUS | 11 refills | Status: DC

## 2017-01-26 NOTE — Telephone Encounter (Signed)
MEDICATION: insulin lispro (HUMALOG KWIKPEN) 100 UNIT/ML KiwkPen  PHARMACY:   [place], [place] - [address] [phone] (Phone) [phone] (Fax)     IS THIS A 90 DAY SUPPLY : Y  IS PATIENT OUT OF MEDICATION: N  IF NOT; HOW MUCH IS LEFT: enough for today  LAST APPOINTMENT DATE: 01/19/17  NEXT APPOINTMENT DATE: 03/30/17  OTHER COMMENTS: Patient stated she does not need anymore needles. Patient stated her dosage has changed to 7 units. Pharmacy stated a new Rx needs to be wrote for change in dose.    **Let patient know to contact pharmacy at the end of the day to make sure medication is ready. **  ** Please notify patient to allow 48-72 hours to process**  **Encourage patient to contact the pharmacy for refills or they can request refills through [facility]**

## 2017-01-26 NOTE — Telephone Encounter (Signed)
Tried to call patient to see how she was taking Humalog. However, I am sending in as prescribed.

## 2017-02-03 DIAGNOSIS — Z6821 Body mass index (BMI) 21.0-21.9, adult: Secondary | ICD-10-CM | POA: Diagnosis not present

## 2017-02-03 DIAGNOSIS — Z0000 Encounter for general adult medical examination without abnormal findings: Secondary | ICD-10-CM | POA: Diagnosis not present

## 2017-02-03 DIAGNOSIS — R05 Cough: Secondary | ICD-10-CM | POA: Diagnosis not present

## 2017-02-08 ENCOUNTER — Ambulatory Visit: Payer: Medicare Other | Admitting: Podiatry

## 2017-02-15 DIAGNOSIS — M8580 Other specified disorders of bone density and structure, unspecified site: Secondary | ICD-10-CM | POA: Diagnosis not present

## 2017-02-15 DIAGNOSIS — I10 Essential (primary) hypertension: Secondary | ICD-10-CM | POA: Diagnosis not present

## 2017-02-15 DIAGNOSIS — M546 Pain in thoracic spine: Secondary | ICD-10-CM | POA: Diagnosis not present

## 2017-02-22 ENCOUNTER — Ambulatory Visit (INDEPENDENT_AMBULATORY_CARE_PROVIDER_SITE_OTHER): Payer: Medicare Other | Admitting: Podiatry

## 2017-02-22 ENCOUNTER — Other Ambulatory Visit: Payer: Self-pay | Admitting: Family Medicine

## 2017-02-22 ENCOUNTER — Encounter: Payer: Self-pay | Admitting: Podiatry

## 2017-02-22 DIAGNOSIS — M79674 Pain in right toe(s): Secondary | ICD-10-CM

## 2017-02-22 DIAGNOSIS — E1142 Type 2 diabetes mellitus with diabetic polyneuropathy: Secondary | ICD-10-CM

## 2017-02-22 DIAGNOSIS — Z853 Personal history of malignant neoplasm of breast: Secondary | ICD-10-CM

## 2017-02-22 DIAGNOSIS — B351 Tinea unguium: Secondary | ICD-10-CM | POA: Diagnosis not present

## 2017-02-22 DIAGNOSIS — L84 Corns and callosities: Secondary | ICD-10-CM | POA: Diagnosis not present

## 2017-02-22 DIAGNOSIS — M79675 Pain in left toe(s): Secondary | ICD-10-CM

## 2017-02-22 DIAGNOSIS — Z9889 Other specified postprocedural states: Secondary | ICD-10-CM

## 2017-02-22 DIAGNOSIS — R921 Mammographic calcification found on diagnostic imaging of breast: Secondary | ICD-10-CM

## 2017-02-22 NOTE — Progress Notes (Signed)
Patient ID: [name], female   DOB: [birth year], 83 y.o.   MRN: [id number]    Subjective: This diabetic patient presents today for follow-up care for debridement of mycotic toenails and evaluation of pre-ulcerative calluses on the right and left feet. Patient has history of ulceratiion recurrent ulcerations. Please note patient's primary care physician will not certify for diabetic shoes  Objective: Orientated 3 No peripheral edema or calf edema or calf tenderness bilaterally DP and PT pulses 2/4 bilaterally Capillary reflex immediate bilaterally  Neurological: Sensation to 10 g monofilament wire intact 1/5 right 0/5 left Vibratory sensation nonreactive bilaterally Ankle reflex equal and reactive bilaterally  Dermatological: No open skin lesions bilaterally Atrophic skin with absent hair growth bilaterally Plantar calluses first MPJ with bleeding within the callus, bilaterally Callus distal second left toe with bleeding within the callus Toenails are elongated, brittle, deformed, discolored 6-10  Musculoskeletal: Hammertoe 2-5 bilaterally  Assessment: Diabetic peripheral neuropathy Hammertoe deformities bilaterally Pre-ulcerative callus distal second toe left pre-ulcerative plantar callus first MPJ bilaterally Symptomatic mycotic toenails 6-10  Plan: Debridement of toenails 6-10 mechanically and electrically without any bleeding Debride all pre-ulcerative keratoses right and left without any bleeding Maintain silicone toe prop left foot Had a discussion with patient today about lack certification by patient's physician. Patient presents with after visit summary dated 12/29/2016 which includes a note by Dr. Larae Grooms stating that in his opinion the patient qualifies for diabetic shoes and he will be willing to sign a form. In the past multiple forms have been submitted to Dr. Loanne Drilling  and he has refused to sign the forms  Indications for shoes: Diabetic peripheral  neuropathy Hammertoes right and left Loss of vibratory sensation Loss of protective sensation Pre-ulcerative plantar callus sub-first MPJ bilaterally  Reappoint 2 weeks

## 2017-02-22 NOTE — Patient Instructions (Signed)
Diabetes and Foot Care Diabetes Yip cause you to have problems because of poor blood supply (circulation) to your feet and legs. This Poppen cause the skin on your feet to become thinner, break easier, and heal more slowly. Your skin Onofre become dry, and the skin Tocci peel and crack. You Haig also have nerve damage in your legs and feet causing decreased feeling in them. You Seif not notice minor injuries to your feet that could lead to infections or more serious problems. Taking care of your feet is one of the most important things you can do for yourself. Follow these instructions at home:  Wear shoes at all times, even in the house. Do not go barefoot. Bare feet are easily injured.  Check your feet daily for blisters, cuts, and redness. If you cannot see the bottom of your feet, use a mirror or ask someone for help.  Wash your feet with warm water (do not use hot water) and mild soap. Then pat your feet and the areas between your toes until they are completely dry. Do not soak your feet as this can dry your skin.  Apply a moisturizing lotion or petroleum jelly (that does not contain alcohol and is unscented) to the skin on your feet and to dry, brittle toenails. Do not apply lotion between your toes.  Trim your toenails straight across. Do not dig under them or around the cuticle. File the edges of your nails with an emery board or nail file.  Do not cut corns or calluses or try to remove them with medicine.  Wear clean socks or stockings every day. Make sure they are not too tight. Do not wear knee-high stockings since they Sanders decrease blood flow to your legs.  Wear shoes that fit properly and have enough cushioning. To break in new shoes, wear them for just a few hours a day. This prevents you from injuring your feet. Always look in your shoes before you put them on to be sure there are no objects inside.  Do not cross your legs. This Chisum decrease the blood flow to your feet.  If you find a  minor scrape, cut, or break in the skin on your feet, keep it and the skin around it clean and dry. These areas Trew be cleansed with mild soap and water. Do not cleanse the area with peroxide, alcohol, or iodine.  When you remove an adhesive bandage, be sure not to damage the skin around it.  If you have a wound, look at it several times a day to make sure it is healing.  Do not use heating pads or hot water bottles. They Falter burn your skin. If you have lost feeling in your feet or legs, you Ishman not know it is happening until it is too late.  Make sure your health care provider performs a complete foot exam at least annually or more often if you have foot problems. Report any cuts, sores, or bruises to your health care provider immediately. Contact a health care provider if:  You have an injury that is not healing.  You have cuts or breaks in the skin.  You have an ingrown nail.  You notice redness on your legs or feet.  You feel burning or tingling in your legs or feet.  You have pain or cramps in your legs and feet.  Your legs or feet are numb.  Your feet always feel cold. Get help right away if:  There is increasing   redness, swelling, or pain in or around a wound.  There is a red line that goes up your leg.  Pus is coming from a wound.  You develop a fever or as directed by your health care provider.  You notice a bad smell coming from an ulcer or wound. This information is not intended to replace advice given to you by your health care provider. Make sure you discuss any questions you have with your health care provider. Document Released: 04/17/2000 Document Revised: 09/26/2015 Document Reviewed: 09/27/2012 Elsevier Interactive Patient Education  2017 [employer]  

## 2017-02-25 ENCOUNTER — Ambulatory Visit: Payer: Medicare Other | Admitting: Neurology

## 2017-03-02 ENCOUNTER — Encounter: Payer: Self-pay | Admitting: Neurology

## 2017-03-02 ENCOUNTER — Ambulatory Visit (INDEPENDENT_AMBULATORY_CARE_PROVIDER_SITE_OTHER): Payer: Medicare Other | Admitting: Neurology

## 2017-03-02 VITALS — BP 147/78 | HR 72 | Ht 67.0 in | Wt 133.5 lb

## 2017-03-02 DIAGNOSIS — I70235 Atherosclerosis of native arteries of right leg with ulceration of other part of foot: Secondary | ICD-10-CM | POA: Diagnosis not present

## 2017-03-02 DIAGNOSIS — R569 Unspecified convulsions: Secondary | ICD-10-CM | POA: Diagnosis not present

## 2017-03-02 DIAGNOSIS — G250 Essential tremor: Secondary | ICD-10-CM | POA: Diagnosis not present

## 2017-03-02 MED ORDER — ALPRAZOLAM 0.25 MG PO TABS: 0.2500 mg | ORAL_TABLET | Freq: Every day | ORAL | 1 refills | Status: DC

## 2017-03-02 MED ORDER — PRIMIDONE 50 MG PO TABS: 50.0000 mg | ORAL_TABLET | Freq: Every day | ORAL | 5 refills | Status: DC

## 2017-03-02 NOTE — Progress Notes (Signed)
Faxed printed/signed rx to [facility] at [phone]. Received fax confirmation.

## 2017-03-02 NOTE — Progress Notes (Signed)
Reason for visit: Seizures  [name] is an 83 y.o. female  History of present illness:  Ms. Crooker is an 83 year old right-handed white female with a history of a seizure disorder associated with encephalomalacia of the right frontal area.  The patient has developed tremors that have affected both hands, she has a brother that also has similar tremor.  The patient has difficulty with handwriting, she would drop things because of the tremor.  She takes low-dose alprazolam at night for sleep.  She is on Dilantin for her seizures, the seizures have remained very well controlled.  The patient still operates a motor vehicle on occasion.  She returns for an evaluation.  A recent CT scan of the brain showed good stability from prior studies.  Past Medical History:  Diagnosis Date  . Anemia   . Anxiety   . Cancer of right breast ([place]) 12/26/12   right breast 12:00 o'clock, DCIS  . Chronic cough   . Chronic facial pain   . Chronic foot pain   . Complication of anesthesia    Sore jaw; could not chew or move mouth  . Convulsions/seizures ([place]) 10/16/2014  . Diabetes mellitus    type 2 niddm x 20 years  . Dyslipidemia   . Ejection fraction   . GERD (gastroesophageal reflux disease)   . Hammer toe    bilateral  . History of colonic polyps   . HTN (hypertension)   . Hx of radiation therapy 03/07/13- 03/29/13   right breast 4250 cGy 17 sessions  . Hyperlipidemia   . Melanoma ([place])   . Metatarsal bone fracture 2014  . Obesity   . Osteoarthritis   . Palpitations   . Seasonal allergies   . Skin cancer   . Tremor, essential 08/18/2016  . Vitamin B12 deficiency     Past Surgical History:  Procedure Laterality Date  . ABDOMINAL HYSTERECTOMY    . BRAIN SURGERY    . BREAST BIOPSY Right 01/24/2013   Procedure: RE-EXCICION OF BREAST CANCER, ANTERIOR MARGINS;  Surgeon: Edward Jolly, MD;  Location: [facility];  Service: General;  Laterality: Right;  . BREAST LUMPECTOMY WITH NEEDLE  LOCALIZATION Right 01/17/2013   Procedure: BREAST LUMPECTOMY WITH NEEDLE LOCALIZATION;  Surgeon: Edward Jolly, MD;  Location: [facility];  Service: General;  Laterality: Right;  . BUNIONECTOMY    . CATARACT EXTRACTION W/ INTRAOCULAR LENS IMPLANT Right   . CHOLECYSTECTOMY    . COLONOSCOPY    . CRANIOTOMY Right 10/18/2013   Procedure: CRANIOTOMY TUMOR EXCISION;  Surgeon: Floyce Stakes, MD;  Location: [facility];  Service: Neurosurgery;  Laterality: Right;  . EYE SURGERY    . HERNIA REPAIR    . KNEE ARTHROSCOPY Bilateral   . POLYPECTOMY     small adenomatous  . ULNAR TUNNEL RELEASE      Family History  Problem Relation Age of Onset  . Heart disease Mother   . Osteoporosis Mother   . Diabetes Father   . Pancreatic cancer Father   . Colon cancer Unknown   . Bone cancer Sister   . Prostate cancer Brother   . Colon cancer Brother   . Rectal cancer Sister   . Thyroid disease Sister        benign goiter resected    Social history:  reports that she has never smoked. She has never used smokeless tobacco. She reports that she does not drink alcohol or use drugs.    Allergies  Allergen Reactions  . Bystolic [Nebivolol Hcl] Other (See Comments)    "extreme weakness, heaviness in legs & arms, swelling in legs/arms/face, swollen abdomen, pain in bladder, feet pain, soreness in chest"  . Cholestyramine     "itching rash on stomach, bloated, nausea, vomiting, sleeplessness, extreme pain in arms"  . Hydrazine Yellow [Tartrazine] Other (See Comments)    "does not reduce high blood pressure, pain in arm, high pressure, felt like I was on verge of heart attack, really weak"  . Morphine Other (See Comments)    Feels morbid, weak, still in pain  . Niacin Palpitations    Fast heart beat  . Niaspan [Niacin Er] Palpitations and Other (See Comments)    "fast heart beat, high blood pressure"  . Norvasc [Amlodipine Besylate] Other (See Comments)    "extreme fluid retention/pain)  . Optivar  [Azelastine Hcl] Photosensitivity  . Repaglinide Hives  . Sular [Nisoldipine Er] Other (See Comments)    "severe headaches, swelling eyes, hands, feet, shortness of breath, weak, flushed face, brain boiling, fluid retention, high blood sugar, nervous, heart fast beating"  . Tegretol [Carbamazepine] Other (See Comments)    Blood poisoning   . Telmisartan Other (See Comments)    "headache, difficulty urinating, high blood sugar, fluid retention"  . Cefdinir Swelling    Vaginal irritation, breathing,   . Clonidine Other (See Comments)    Dry mouth, fluid retention  . Clonidine Hydrochloride Other (See Comments)    Dry mouth, fluid retention  . Codeine Nausea And Vomiting  . Ezetimibe Other (See Comments)    Made weak  . Naproxen Other (See Comments)    Shrinks bladder  . Ziac [Bisoprolol-Hydrochlorothiazide] Other (See Comments)    "stopped urination"  . Elavil [Amitriptyline] Other (See Comments)    Gave Pt nightmares  . Keppra [Levetiracetam] Other (See Comments)    Shaking  . Lamictal [Lamotrigine]     itching  . Lyrica [Pregabalin] Swelling  . Topamax [Topiramate]     Dry eyes  . Ace Inhibitors Other (See Comments)    unknown  . Actonel [Risedronate Sodium] Other (See Comments)    unknown  . Amlodipine Besylate Other (See Comments)    unknown  . Aspirin Other (See Comments)    unknown  . Atacand [Candesartan] Other (See Comments)    unknown  . Bextra [Valdecoxib] Other (See Comments)    unknown  . Bisoprolol-Hydrochlorothiazide Other (See Comments)    unknown  . Candesartan Cilexetil Other (See Comments)    unknown  . Cefadroxil Other (See Comments)    unknown  . Celecoxib Rash  . Hydrocodone Other (See Comments)    unknown  . Hydrocodone-Acetaminophen Other (See Comments)    unknown  . Iodinated Diagnostic Agents Rash    "All over"  . Meloxicam Other (See Comments)    unknown  . Methylprednisolone Sodium Succinate Other (See Comments)    unknown  .  Nabumetone Other (See Comments)    unknown  . Penicillins Other (See Comments)    unknown  . Pseudoephedrine-Guaifenesin Other (See Comments)    unknown  . Risedronate Sodium Other (See Comments)    unknown  . Rofecoxib Other (See Comments)    unknown  . Ru-Tuss [Chlorphen-Pse-Atrop-Hyos-Scop] Other (See Comments)    unknown  . Sulfonamide Derivatives Other (See Comments)    unknown  . Sulphur [Sulfur] Other (See Comments)    unknown  . Telithromycin Other (See Comments)    unknown  . Terfenadine Other (See Comments)  unknown  . Trandolapril-Verapamil Hcl Er Other (See Comments)    Headache, difficulty urinating, high blood sugar, fluid retention  Pt is taking Tarka (trandolapril-verapamil) currently, but requests the medication stay in her allergy list  . Valium [Diazepam] Other (See Comments)    Makes her mean and hyper    Medications:  Prior to Admission medications   Medication Sig Start Date End Date Taking? Authorizing Provider  acetaminophen (TYLENOL) 500 MG tablet Take 1,000 mg by mouth every 6 (six) hours as needed for pain.   Yes [provider]  ALPRAZolam (XANAX) 0.25 MG tablet Take 1 tablet (0.25 mg total) by mouth at bedtime. 08/19/16  Yes Kathrynn Ducking, MD  atorvastatin (LIPITOR) 20 MG tablet Take 1 tablet by mouth daily. 08/10/16  Yes [provider]  Biotin 1000 MCG tablet Take 1,000 mcg by mouth daily.    Yes [provider]  Blood Glucose Monitoring Suppl (PRODIGY VOICE BLOOD GLUCOSE) w/Device KIT Use to check blood sugar 1 time per day. 10/18/15  Yes Renato Shin, MD  calcitonin, salmon, (MIACALCIN/FORTICAL) 200 UNIT/ACT nasal spray Place 1 spray into alternate nostrils daily. 06/09/16  Yes [provider]  Cholecalciferol 5000 units capsule Take 5,000 Units by mouth daily.   Yes [provider]  colestipol (COLESTID) 1 G tablet Take 1-2 g by mouth 3 (three) times daily. Take 2 tablets in the morning, 1 tablet at  noon, and 2 tablets in the evening   Yes [provider]  cyanocobalamin (,VITAMIN B-12,) 1000 MCG/ML injection INJECT 1 ML INTRAMUSCULARLY EVERY 21 DAYS 06/18/16  Yes Hoyt Koch, MD  DiphenhydrAMINE HCl (BENADRYL ALLERGY PO) Take 1 tablet by mouth as needed.   Yes [provider]  escitalopram (LEXAPRO) 10 MG tablet Take 10 mg by mouth daily.   Yes [provider]  ferrous sulfate 325 (65 FE) MG tablet Take 325 mg by mouth every other day.   Yes [provider]  furosemide (LASIX) 20 MG tablet Take 1 tablet (20 mg total) by mouth daily. Do not take until I call you about labs. Patient taking differently: Take 20 mg by mouth as needed. Do not take until I call you about labs. 12/27/13  Yes Marin Olp, MD  glucose blood test strip Use to check blood sugar 1 time per day. 10/22/15  Yes Renato Shin, MD  insulin lispro (HUMALOG KWIKPEN) 100 UNIT/ML KiwkPen Inject 0.03-0.04 mLs (3-4 Units total) into the skin 3 (three) times daily with meals. And pen needles 1/day 01/26/17  Yes Renato Shin, MD  Insulin Pen Needle 32G X 4 MM MISC Used to inject insulin 3x daily 11/19/16  Yes Renato Shin, MD  JARDIANCE 25 MG TABS tablet Take 1 tablet by mouth daily. 08/31/16  Yes [provider]  levothyroxine (SYNTHROID, LEVOTHROID) 50 MCG tablet TAKE 1 TABLET DAILY BEFORE BREAKFAST 06/14/16  Yes Renato Shin, MD  lidocaine (LIDODERM) 5 % Place 1 patch onto the skin daily. Remove & Discard patch within 12 hours or as directed by MD Patient taking differently: Place 2 patches onto the skin daily as needed (pain). Remove & Discard patch within 12 hours or as directed by MD 01/25/15  Yes Hoyt Koch, MD  OVER THE COUNTER MEDICATION Take 1 capsule by mouth daily. Recall Max memory supplement   Yes [provider]  phenytoin (DILANTIN) 100 MG ER capsule TAKE 3 CAPSULES AT BEDTIME Patient taking differently: TAKE 3 CAPSULES AT BEDTIME. 2 capsules at  bedtime  and 1 in the morning 08/10/16  Yes Kathrynn Ducking, MD  PHENYTOIN INFATABS 50 MG tablet CHEW 1 TABLET DAILY 04/22/16  Yes Kathrynn Ducking, MD  Polyethyl Glycol-Propyl Glycol 0.4-0.3 % SOLN Place 2 drops into both eyes at bedtime.    Yes [provider]  ranitidine (ZANTAC) 150 MG capsule Take 150 mg by mouth daily as needed for heartburn.    Yes [provider]  traMADol (ULTRAM) 50 MG tablet Take 50 mg by mouth every 6 (six) hours as needed.    Yes [provider]  trandolapril-verapamil (TARKA) 2-240 MG tablet Take 1 tablet by mouth daily.   Yes [provider]    ROS:  Out of a complete 14 system review of symptoms, the patient complains only of the following symptoms, and all other reviewed systems are negative.  Fatigue Runny nose Eye itching, double vision, cough Leg swelling Excessive thirst Diarrhea Daytime sleepiness Urgency of the bladder Back pain Itching Anemia Weakness, tremors Agitation  Blood pressure (!) 147/78, pulse 72, height _0  (1.702 m), weight 133 lb 8 oz (60.6 kg).  Physical Exam  General: The patient is alert and cooperative at the time of the examination.  Skin: No significant peripheral edema is noted.   Neurologic Exam  Mental status: The patient is alert and oriented x 3 at the time of the examination. The patient has apparent normal recent and remote memory, with an apparently normal attention span and concentration ability.   Cranial nerves: Facial symmetry is present. Speech is normal, no aphasia or dysarthria is noted. Extraocular movements are full. Visual fields are full.  Motor: The patient has good strength in all 4 extremities.  Sensory examination: Soft touch sensation is symmetric on the face, arms, and legs.  Coordination: The patient has good finger-nose-finger and heel-to-shin bilaterally.  The patient does have tremors with arms outstretched, the tremors are symmetric.  Mild  tremor seen with finger-nose-finger bilaterally.  Gait and station: The patient has a normal gait. Tandem gait is unsteady. Romberg is negative. No drift is seen.  Reflexes: Deep tendon reflexes are symmetric.   CT brain 08/27/16:  IMPRESSION:  Abnormal CT head (without) demonstrating: 1. Right frontal encephalomalacia and overlying craniotomy changes.  2. Chronic lacunar infarction in the left basal ganglia.  3. No acute findings. 4. No change from CT on 08/19/15.  * CT scan images were reviewed online. I agree with the written report.    Assessment/Plan:  1.  History of seizures, well controlled  2.  Essential tremor  3.  History of headache, resolved  The patient is doing much better currently with the headache, the headache is gone.  The patient continues to have an essential tremor, her brother has a similar tremor.  We will try low-dose Mysoline for this, but the patient has had difficulty tolerating multiple medications in the past.  The patient will follow up in 6 months.  A prescription for the 50 mg tablets of Mysoline were sent in, a prescription for her alprazolam was sent in.  She will call for any dose adjustments.  Jill Alexanders MD 03/02/2017 10:18 AM  [facility] [address] [place], [place] [address]  Phone [phone] Fax [phone]

## 2017-03-02 NOTE — Patient Instructions (Signed)
   We will start Mysoline 50 mg daily for the tremor. Call for any dose adjustments.  Main side effect is drowsiness.

## 2017-03-09 ENCOUNTER — Ambulatory Visit: Payer: Medicare Other | Admitting: Orthotics

## 2017-03-09 DIAGNOSIS — E1142 Type 2 diabetes mellitus with diabetic polyneuropathy: Secondary | ICD-10-CM

## 2017-03-09 DIAGNOSIS — L97512 Non-pressure chronic ulcer of other part of right foot with fat layer exposed: Secondary | ICD-10-CM

## 2017-03-09 NOTE — Progress Notes (Signed)
Patient presents today for diabetic shoe measurement and foam casting.  Goals of diabetic shoes/inserts to offer protection from conditions secondary to DM2, offer relief from sheer forces that could lead to ulcerations, protect the foot, and offer greater stability. Patient is under supervision of DPM [place] Physician managing patients DM2: [name] Patient has following documented conditions to qualify for diabetic shoes/inserts: Patient measured with brannock device:. 45M

## 2017-03-21 NOTE — Addendum Note (Signed)
Addended by: Renato Shin on: 03/21/2017 01:04 PM   Modules accepted: Level of Service

## 2017-03-23 ENCOUNTER — Emergency Department (HOSPITAL_COMMUNITY): Payer: Medicare Other

## 2017-03-23 ENCOUNTER — Emergency Department (HOSPITAL_COMMUNITY)
Admission: EM | Admit: 2017-03-23 | Discharge: 2017-03-23 | Disposition: A | Discharge status: Home / Self Care | Payer: Medicare Other | Attending: Emergency Medicine | Admitting: Emergency Medicine

## 2017-03-23 ENCOUNTER — Other Ambulatory Visit: Payer: Self-pay

## 2017-03-23 ENCOUNTER — Encounter (HOSPITAL_COMMUNITY): Payer: Self-pay

## 2017-03-23 DIAGNOSIS — I2510 Atherosclerotic heart disease of native coronary artery without angina pectoris: Secondary | ICD-10-CM | POA: Insufficient documentation

## 2017-03-23 DIAGNOSIS — D649 Anemia, unspecified: Secondary | ICD-10-CM | POA: Insufficient documentation

## 2017-03-23 DIAGNOSIS — I10 Essential (primary) hypertension: Secondary | ICD-10-CM | POA: Diagnosis not present

## 2017-03-23 DIAGNOSIS — Z794 Long term (current) use of insulin: Secondary | ICD-10-CM | POA: Diagnosis not present

## 2017-03-23 DIAGNOSIS — S46911D Strain of unspecified muscle, fascia and tendon at shoulder and upper arm level, right arm, subsequent encounter: Secondary | ICD-10-CM | POA: Diagnosis not present

## 2017-03-23 DIAGNOSIS — W01198A Fall on same level from slipping, tripping and stumbling with subsequent striking against other object, initial encounter: Secondary | ICD-10-CM | POA: Diagnosis not present

## 2017-03-23 DIAGNOSIS — Y929 Unspecified place or not applicable: Secondary | ICD-10-CM | POA: Diagnosis not present

## 2017-03-23 DIAGNOSIS — E119 Type 2 diabetes mellitus without complications: Secondary | ICD-10-CM | POA: Insufficient documentation

## 2017-03-23 DIAGNOSIS — J45909 Unspecified asthma, uncomplicated: Secondary | ICD-10-CM | POA: Insufficient documentation

## 2017-03-23 DIAGNOSIS — G8929 Other chronic pain: Secondary | ICD-10-CM | POA: Insufficient documentation

## 2017-03-23 DIAGNOSIS — Y999 Unspecified external cause status: Secondary | ICD-10-CM | POA: Insufficient documentation

## 2017-03-23 DIAGNOSIS — Y9389 Activity, other specified: Secondary | ICD-10-CM | POA: Diagnosis not present

## 2017-03-23 DIAGNOSIS — Z79899 Other long term (current) drug therapy: Secondary | ICD-10-CM | POA: Diagnosis not present

## 2017-03-23 DIAGNOSIS — S4991XA Unspecified injury of right shoulder and upper arm, initial encounter: Secondary | ICD-10-CM | POA: Diagnosis not present

## 2017-03-23 DIAGNOSIS — S4991XD Unspecified injury of right shoulder and upper arm, subsequent encounter: Secondary | ICD-10-CM | POA: Diagnosis present

## 2017-03-23 DIAGNOSIS — M25511 Pain in right shoulder: Secondary | ICD-10-CM | POA: Diagnosis not present

## 2017-03-23 MED ORDER — ACETAMINOPHEN 500 MG PO TABS
1000.0000 mg | ORAL_TABLET | Freq: Once | ORAL | Status: AC
  Administered 2017-03-23: 1000 mg via ORAL
  Filled 2017-03-23: qty 2

## 2017-03-23 MED ORDER — ACETAMINOPHEN 500 MG PO TABS: 1000.0000 mg | ORAL_TABLET | Freq: Three times a day (TID) | ORAL | 0 refills | Status: AC

## 2017-03-23 NOTE — ED Provider Notes (Signed)
[facility] EMERGENCY DEPARTMENT Provider Note  CSN: [id number] Arrival date & time: 03/23/17 1527  Chief Complaint(s) Fall and Shoulder Pain  HPI [name] is a 83 y.o. female   The history is provided by the patient.  Shoulder Pain   This is a new problem. Episode onset: 6 days. The problem occurs constantly. Progression since onset: had mild soreness until this morning when awoke, she noted the pain was worse. The pain is present in the right shoulder. The quality of the pain is described as aching. The pain is moderate. Associated symptoms include limited range of motion. The symptoms are aggravated by contact and activity. She has tried OTC pain medications and rest for the symptoms. There has been a history of trauma (mechanical fall 6 days).    Past Medical History Past Medical History:  Diagnosis Date  . Anemia   . Anxiety   . Cancer of right breast ([place]) 12/26/12   right breast 12:00 o'clock, DCIS  . Chronic cough   . Chronic facial pain   . Chronic foot pain   . Complication of anesthesia    Sore jaw; could not chew or move mouth  . Convulsions/seizures ([place]) 10/16/2014  . Diabetes mellitus    type 2 niddm x 20 years  . Dyslipidemia   . Ejection fraction   . GERD (gastroesophageal reflux disease)   . Hammer toe    bilateral  . History of colonic polyps   . HTN (hypertension)   . Hx of radiation therapy 03/07/13- 03/29/13   right breast 4250 cGy 17 sessions  . Hyperlipidemia   . Melanoma ([place])   . Metatarsal bone fracture 2014  . Obesity   . Osteoarthritis   . Palpitations   . Seasonal allergies   . Skin cancer   . Tremor, essential 08/18/2016  . Vitamin B12 deficiency    Patient Active Problem List   Diagnosis Date Noted  . Dysuria 01/19/2017  . Vitamin D deficiency 11/18/2016  . Tremor, essential 08/18/2016  . Convulsions/seizures ([place]) 10/16/2014  . Brain tumor (benign) ([place]) 10/18/2013  . Subdural hemorrhage ([place]) 10/09/2013  .  Anxiety 10/09/2013  . Compression fracture of T12 vertebra ([place]) 10/09/2013  . Ejection fraction   . Multiple thyroid nodules 09/05/2013  . Syncope 08/27/2013  . Carotid artery disease ([place]) 08/27/2013  . Abnormal thyroid ultrasound 08/27/2013  . Cancer of right breast ([place])   . Cancer of central portion of female breast ([place]) 12/30/2012  . Osteoporosis 03/06/2010  . ASTHMA 03/05/2009  . IRRITABLE BOWEL SYNDROME  diarrhea type 03/21/2008  . ANEMIA-NOS 12/16/2006  . GERD 12/16/2006  . Diabetes ([place]) 10/29/2006  . Hyperlipidemia 10/29/2006  . Essential hypertension 10/29/2006  . Allergic rhinitis, cause unspecified 10/29/2006  . OSTEOARTHRITIS 10/29/2006   Home Medication(s) Prior to Admission medications   Medication Sig Start Date End Date Taking? Authorizing Provider  acetaminophen (TYLENOL) 500 MG tablet Take 2 tablets (1,000 mg total) by mouth every 8 (eight) hours for 10 days. 03/23/17 04/02/17  Fatima Blank, MD  ALPRAZolam Duanne Moron) 0.25 MG tablet Take 1 tablet (0.25 mg total) by mouth at bedtime. 03/02/17   Kathrynn Ducking, MD  atorvastatin (LIPITOR) 20 MG tablet Take 1 tablet by mouth daily. 08/10/16   [provider]  Biotin 1000 MCG tablet Take 1,000 mcg by mouth daily.     [provider]  Blood Glucose Monitoring Suppl (PRODIGY VOICE BLOOD GLUCOSE) w/Device KIT Use to check blood sugar 1  time per day. 10/18/15   Renato Shin, MD  calcitonin, salmon, (MIACALCIN/FORTICAL) 200 UNIT/ACT nasal spray Place 1 spray into alternate nostrils daily. 06/09/16   [provider]  Cholecalciferol 5000 units capsule Take 5,000 Units by mouth daily.    [provider]  colestipol (COLESTID) 1 G tablet Take 1-2 g by mouth 3 (three) times daily. Take 2 tablets in the morning, 1 tablet at noon, and 2 tablets in the evening    [provider]  cyanocobalamin (,VITAMIN B-12,) 1000 MCG/ML injection INJECT 1 ML INTRAMUSCULARLY EVERY 21 DAYS 06/18/16    Hoyt Koch, MD  DiphenhydrAMINE HCl (BENADRYL ALLERGY PO) Take 1 tablet by mouth as needed.    [provider]  escitalopram (LEXAPRO) 10 MG tablet Take 10 mg by mouth daily.    [provider]  ferrous sulfate 325 (65 FE) MG tablet Take 325 mg by mouth every other day.    [provider]  furosemide (LASIX) 20 MG tablet Take 1 tablet (20 mg total) by mouth daily. Do not take until I call you about labs. Patient taking differently: Take 20 mg by mouth as needed. Do not take until I call you about labs. 12/27/13   Marin Olp, MD  glucose blood test strip Use to check blood sugar 1 time per day. 10/22/15   Renato Shin, MD  insulin lispro (HUMALOG KWIKPEN) 100 UNIT/ML KiwkPen Inject 0.03-0.04 mLs (3-4 Units total) into the skin 3 (three) times daily with meals. And pen needles 1/day 01/26/17   Renato Shin, MD  Insulin Pen Needle 32G X 4 MM MISC Used to inject insulin 3x daily 11/19/16   Renato Shin, MD  JARDIANCE 25 MG TABS tablet Take 1 tablet by mouth daily. 08/31/16   [provider]  levothyroxine (SYNTHROID, LEVOTHROID) 50 MCG tablet TAKE 1 TABLET DAILY BEFORE BREAKFAST 06/14/16   Renato Shin, MD  lidocaine (LIDODERM) 5 % Place 1 patch onto the skin daily. Remove & Discard patch within 12 hours or as directed by MD Patient taking differently: Place 2 patches onto the skin daily as needed (pain). Remove & Discard patch within 12 hours or as directed by MD 01/25/15   Hoyt Koch, MD  OVER THE COUNTER MEDICATION Take 1 capsule by mouth daily. Recall Max memory supplement    [provider]  phenytoin (DILANTIN) 100 MG ER capsule TAKE 3 CAPSULES AT BEDTIME Patient taking differently: TAKE 3 CAPSULES AT BEDTIME. 2 capsules at bedtime and 1 in the morning 08/10/16   Kathrynn Ducking, MD  PHENYTOIN INFATABS 50 MG tablet CHEW 1 TABLET DAILY 04/22/16   Kathrynn Ducking, MD  Polyethyl Glycol-Propyl Glycol 0.4-0.3 % SOLN Place 2  drops into both eyes at bedtime.     [provider]  primidone (MYSOLINE) 50 MG tablet Take 1 tablet (50 mg total) by mouth daily. 03/02/17   Kathrynn Ducking, MD  ranitidine (ZANTAC) 150 MG capsule Take 150 mg by mouth daily as needed for heartburn.     [provider]  traMADol (ULTRAM) 50 MG tablet Take 50 mg by mouth every 6 (six) hours as needed.     [provider]  trandolapril-verapamil (TARKA) 2-240 MG tablet Take 1 tablet by mouth daily.    [provider]  Past Surgical History Past Surgical History:  Procedure Laterality Date  . ABDOMINAL HYSTERECTOMY    . BRAIN SURGERY    . BREAST BIOPSY Right 01/24/2013   Procedure: RE-EXCICION OF BREAST CANCER, ANTERIOR MARGINS;  Surgeon: Edward Jolly, MD;  Location: [facility];  Service: General;  Laterality: Right;  . BREAST LUMPECTOMY WITH NEEDLE LOCALIZATION Right 01/17/2013   Procedure: BREAST LUMPECTOMY WITH NEEDLE LOCALIZATION;  Surgeon: Edward Jolly, MD;  Location: [place];  Service: General;  Laterality: Right;  . BUNIONECTOMY    . CATARACT EXTRACTION W/ INTRAOCULAR LENS IMPLANT Right   . CHOLECYSTECTOMY    . COLONOSCOPY    . CRANIOTOMY Right 10/18/2013   Procedure: CRANIOTOMY TUMOR EXCISION;  Surgeon: Floyce Stakes, MD;  Location: [facility];  Service: Neurosurgery;  Laterality: Right;  . EYE SURGERY    . HERNIA REPAIR    . KNEE ARTHROSCOPY Bilateral   . POLYPECTOMY     small adenomatous  . ULNAR TUNNEL RELEASE     Family History Family History  Problem Relation Age of Onset  . Heart disease Mother   . Osteoporosis Mother   . Diabetes Father   . Pancreatic cancer Father   . Colon cancer Unknown   . Bone cancer Sister   . Prostate cancer Brother   . Colon cancer Brother   . Rectal cancer Sister   . Thyroid disease Sister        benign goiter  resected    Social History Social History   Tobacco Use  . Smoking status: Never Smoker  . Smokeless tobacco: Never Used  Substance Use Topics  . Alcohol use: No  . Drug use: No   Allergies Bystolic [nebivolol hcl]; Cholestyramine; Hydrazine yellow [tartrazine]; Morphine; Niacin; Niaspan [niacin er]; Norvasc [amlodipine besylate]; Optivar [azelastine hcl]; Repaglinide; Sular [nisoldipine er]; Tegretol [carbamazepine]; Telmisartan; Cefdinir; Clonidine; Clonidine hydrochloride; Codeine; Ezetimibe; Naproxen; Ziac [bisoprolol-hydrochlorothiazide]; Elavil [amitriptyline]; Keppra [levetiracetam]; Lamictal [lamotrigine]; Lyrica [pregabalin]; Topamax [topiramate]; Ace inhibitors; Actonel [risedronate sodium]; Amlodipine besylate; Aspirin; Atacand [candesartan]; Bextra [valdecoxib]; Bisoprolol-hydrochlorothiazide; Candesartan cilexetil; Cefadroxil; Celecoxib; Hydrocodone; Hydrocodone-acetaminophen; Iodinated diagnostic agents; Meloxicam; Methylprednisolone sodium succinate; Nabumetone; Penicillins; Pseudoephedrine-guaifenesin; Risedronate sodium; Rofecoxib; Ru-tuss [chlorphen-pse-atrop-hyos-scop]; Sulfonamide derivatives; [place] [sulfur]; Telithromycin; Terfenadine; Trandolapril-verapamil hcl er; and Valium [diazepam]  Review of Systems Review of Systems All other systems are reviewed and are negative for acute change except as noted in the HPI  Physical Exam Vital Signs  I have reviewed the triage vital signs BP (!) 170/89   Pulse 60   Temp 98.4 F (36.9 C) (Oral)   Resp 20   Ht '5\' 7"'  (1.702 m)   Wt 60.3 kg (133 lb)   SpO2 96%   BMI 20.83 kg/m   Physical Exam  Constitutional: She is oriented to person, place, and time. She appears well-developed and well-nourished. No distress.  HENT:  Head: Normocephalic and atraumatic.  Right Ear: External ear normal.  Left Ear: External ear normal.  Nose: Nose normal.  Eyes: Conjunctivae and EOM are normal. No scleral icterus.  Neck: Normal  range of motion and phonation normal.  Cardiovascular: Normal rate and regular rhythm.  Pulmonary/Chest: Effort normal. No stridor. No respiratory distress.  Abdominal: She exhibits no distension.  Musculoskeletal: She exhibits no edema.       Right shoulder: She exhibits tenderness.       Cervical back: She exhibits tenderness.       Back:  Neurological: She is alert and oriented to person, place, and time.  Skin: She is not  diaphoretic.  Psychiatric: She has a normal mood and affect. Her behavior is normal.  Vitals reviewed.   ED Results and Treatments Labs (all labs ordered are listed, but only abnormal results are displayed) Labs Reviewed - No data to display                                                                                                                       EKG  EKG Interpretation  Date/Time:    Ventricular Rate:    PR Interval:    QRS Duration:   QT Interval:    QTC Calculation:   R Axis:     Text Interpretation:        Radiology Dg Shoulder Right  Result Date: 03/23/2017 CLINICAL DATA:  Pain after fall on Wednesday. Remote right upper arm fracture 1 year ago not requiring surgery. EXAM: RIGHT SHOULDER - 2+ VIEW COMPARISON:  08/19/2015 FINDINGS: Healed fracture at the junction of the right humeral head and neck. No acute fracture nor joint dislocations. The [facility] joint is maintained. The adjacent ribs and lung are nonacute. Soft tissues are unremarkable. Surgical clips project over the right breast. IMPRESSION: Chronic healed fracture of the junction of the right humeral head and neck. No acute osseous abnormality. Electronically Signed   By: Ashley Royalty M.D.   On: 03/23/2017 17:13   Dg Humerus Right  Result Date: 03/23/2017 CLINICAL DATA:  Pain after fall on Wednesday. Remote proximal humerus fracture. EXAM: RIGHT HUMERUS - 2+ VIEW COMPARISON:  Right shoulder radiographs 08/19/2015 FINDINGS: Healed fracture of the right humeral neck and head. No acute  fracture nor joint dislocations. The [facility] joint is maintained. No soft tissue abnormalities. IMPRESSION: Chronic healed fracture deformity of the right humeral neck and head since prior comparison. No acute osseous abnormality. Electronically Signed   By: Ashley Royalty M.D.   On: 03/23/2017 17:14   Pertinent labs & imaging results that were available during my care of the patient were reviewed by me and considered in my medical decision making (see chart for details).  Medications Ordered in ED Medications  acetaminophen (TYLENOL) tablet 1,000 mg (not administered)                                                                                                                                    Procedures Procedures  (including critical care time)  Medical Decision Making / ED Course I  have reviewed the nursing notes for this encounter and the patient's prior records (if available in EHR or on provided paperwork).    Plain film without evidence of acute fracture.  Presentation is more consistent with muscle strain/spasm of the right parascapular/shoulder girdle muscle groups.  Patient has allergies to multiple medications and can only take Tylenol and tramadol for her pain.  She is already taking Tylenol and tramadol at this time.  Patient is already using lidocaine patches recommended she also use muscle creams.   The patient is safe for discharge with strict return precautions.   Final Clinical Impression(s) / ED Diagnoses Final diagnoses:  Muscle strain of right shoulder, subsequent encounter    Disposition: Discharge  Condition: Good  I have discussed the results, Dx and Tx plan with the patient who expressed understanding and agree(s) with the plan. Discharge instructions discussed at great length. The patient was given strict return precautions who verbalized understanding of the instructions. No further questions at time of discharge.    ED Discharge Orders        Ordered     acetaminophen (TYLENOL) 500 MG tablet  Every 8 hours     03/23/17 1805       Follow Up: [name], MD [place] [place] [address] [phone]  Schedule an appointment as soon as possible for a visit  in 5-7 days, If symptoms do not improve or  worsen    This chart was dictated using voice recognition software.  Despite best efforts to proofread,  errors can occur which can change the documentation meaning.   Fatima Blank, MD 03/23/17 [phone]

## 2017-03-23 NOTE — ED Triage Notes (Signed)
Pt. Fell on Wednesday night . She was walking with her walker and the walker turned and she fell onto her rt. Shoulder, arm and rt. Leg.  She has limited movement to her rt. Arm.  She has a hx of rt.upper arm fx.    She denies hitting her head .  Her pain is severe.  Skin is p/w/d.

## 2017-03-23 NOTE — ED Notes (Signed)
ED Provider at bedside. 

## 2017-03-23 NOTE — Discharge Instructions (Signed)
You [place] use over-the-counter Acetaminophen (Tylenol), topical muscle creams such as SalonPas, Icy Hot, Bengay, etc. Please stretch, apply heat, and have massage therapy for additional assistance.  

## 2017-03-30 ENCOUNTER — Encounter: Payer: Self-pay | Admitting: Endocrinology

## 2017-03-30 ENCOUNTER — Ambulatory Visit (INDEPENDENT_AMBULATORY_CARE_PROVIDER_SITE_OTHER): Payer: Medicare Other | Admitting: Endocrinology

## 2017-03-30 VITALS — BP 132/78 | HR 55 | Wt 139.0 lb

## 2017-03-30 DIAGNOSIS — M810 Age-related osteoporosis without current pathological fracture: Secondary | ICD-10-CM

## 2017-03-30 DIAGNOSIS — I70235 Atherosclerosis of native arteries of right leg with ulceration of other part of foot: Secondary | ICD-10-CM | POA: Diagnosis not present

## 2017-03-30 DIAGNOSIS — E1151 Type 2 diabetes mellitus with diabetic peripheral angiopathy without gangrene: Secondary | ICD-10-CM | POA: Diagnosis not present

## 2017-03-30 LAB — POCT GLYCOSYLATED HEMOGLOBIN (HGB A1C): Hemoglobin A1C: 6.8

## 2017-03-30 MED ORDER — INSULIN LISPRO 100 UNIT/ML (KWIKPEN): 7.0000 [IU] | PEN_INJECTOR | Freq: Three times a day (TID) | SUBCUTANEOUS | 11 refills | Status: DC

## 2017-03-30 NOTE — Patient Instructions (Addendum)
check your blood sugar twice a day.  vary the time of day when you check, between before the 3 meals, and at bedtime.  also check if you have symptoms of your blood sugar being too high or too low.  please keep a record of the readings and bring it to your next appointment here (or you can bring the meter itself).  You can write it on any piece of paper.  please call us sooner if your blood sugar goes below 70, or if you have a lot of readings over 200.     Please continue the same insulin. Let's recheck the bone density test.  you will receive a phone call, about a day and time for an appointment. Please come back for a follow-up appointment in 3 months.

## 2017-03-30 NOTE — Progress Notes (Signed)
Subjective:    Patient ID: [name], female    DOB: 12-05-1929, 83 y.o.   MRN: [id number]  HPI Pt returns for f/u of osteoporosis (dx'ed 2017; she had first Reclast infusion in Sept, 2018; she has had these bony fractures: left foot, T-spine, right wrist, and right humerus; these happens with seizures or other falls).   Pt also returns for f/u of diabetes mellitus:   DM type: 2 (but lean body habitus suggests she is developing type 1).   Dx'ed: 0354 Complications: polyneuropathy and PAD.   Therapy: insulin since 2018.  DKA: never.    Severe hypoglycemia: never.  Pancreatitis: never.  Other: she takes multiple daily injections.    Interval history: she brings a record of her cbg's with I have reviewed today.  It varies from 63-200's. There is no trend throughout the day.  pt states she feels well in general.  She stopped jardiance She takes vit-D, 5000 units qd.  No recent falls.  Past Medical History:  Diagnosis Date  . Anemia   . Anxiety   . Cancer of right breast ([place]) 12/26/12   right breast 12:00 o'clock, DCIS  . Chronic cough   . Chronic facial pain   . Chronic foot pain   . Complication of anesthesia    Sore jaw; could not chew or move mouth  . Convulsions/seizures ([place]) 10/16/2014  . Diabetes mellitus    type 2 niddm x 20 years  . Dyslipidemia   . Ejection fraction   . GERD (gastroesophageal reflux disease)   . Hammer toe    bilateral  . History of colonic polyps   . HTN (hypertension)   . Hx of radiation therapy 03/07/13- 03/29/13   right breast 4250 cGy 17 sessions  . Hyperlipidemia   . Melanoma ([place])   . Metatarsal bone fracture 2014  . Obesity   . Osteoarthritis   . Palpitations   . Seasonal allergies   . Skin cancer   . Tremor, essential 08/18/2016  . Vitamin B12 deficiency     Past Surgical History:  Procedure Laterality Date  . ABDOMINAL HYSTERECTOMY    . BRAIN SURGERY    . BREAST BIOPSY Right 01/24/2013   Procedure: RE-EXCICION OF BREAST CANCER,  ANTERIOR MARGINS;  Surgeon: Edward Jolly, MD;  Location: [facility];  Service: General;  Laterality: Right;  . BREAST LUMPECTOMY WITH NEEDLE LOCALIZATION Right 01/17/2013   Procedure: BREAST LUMPECTOMY WITH NEEDLE LOCALIZATION;  Surgeon: Edward Jolly, MD;  Location: [facility];  Service: General;  Laterality: Right;  . BUNIONECTOMY    . CATARACT EXTRACTION W/ INTRAOCULAR LENS IMPLANT Right   . CHOLECYSTECTOMY    . COLONOSCOPY    . CRANIOTOMY Right 10/18/2013   Procedure: CRANIOTOMY TUMOR EXCISION;  Surgeon: Floyce Stakes, MD;  Location: [facility];  Service: Neurosurgery;  Laterality: Right;  . EYE SURGERY    . HERNIA REPAIR    . KNEE ARTHROSCOPY Bilateral   . POLYPECTOMY     small adenomatous  . ULNAR TUNNEL RELEASE      Social History   Socioeconomic History  . Marital status: Widowed    Spouse name: Not on file  . Number of children: 0  . Years of education: college  . Highest education level: Not on file  Social Needs  . Financial resource strain: Not on file  . Food insecurity - worry: Not on file  . Food insecurity - inability: Not on file  . Transportation needs -  medical: Not on file  . Transportation needs - non-medical: Not on file  Occupational History  . Occupation: retired  Tobacco Use  . Smoking status: Never Smoker  . Smokeless tobacco: Never Used  Substance and Sexual Activity  . Alcohol use: No  . Drug use: No  . Sexual activity: Yes    Comment: menarche age 33, hysterectomy age 48, HRT x 2-3 mos, G1- miscarriage  Other Topics Concern  . Not on file  Social History Narrative   Widowed, lives alone.  Ambulates independently.    Drinks decaf only   Patient is right handed.    Current Outpatient Medications on File Prior to Visit  Medication Sig Dispense Refill  . acetaminophen (TYLENOL) 500 MG tablet Take 2 tablets (1,000 mg total) by mouth every 8 (eight) hours for 10 days. 30 tablet 0  . ALPRAZolam (XANAX) 0.25 MG tablet Take 1 tablet (0.25  mg total) by mouth at bedtime. 90 tablet 1  . atorvastatin (LIPITOR) 20 MG tablet Take 1 tablet by mouth daily.    . Biotin 1000 MCG tablet Take 1,000 mcg by mouth daily.     . Blood Glucose Monitoring Suppl (PRODIGY VOICE BLOOD GLUCOSE) w/Device KIT Use to check blood sugar 1 time per day. 1 each 2  . calcitonin, salmon, (MIACALCIN/FORTICAL) 200 UNIT/ACT nasal spray Place 1 spray into alternate nostrils daily.    . Cholecalciferol 5000 units capsule Take 5,000 Units by mouth daily.    . colestipol (COLESTID) 1 G tablet Take 1-2 g by mouth 3 (three) times daily. Take 2 tablets in the morning, 1 tablet at noon, and 2 tablets in the evening    . cyanocobalamin (,VITAMIN B-12,) 1000 MCG/ML injection INJECT 1 ML INTRAMUSCULARLY EVERY 21 DAYS 3 mL 2  . DiphenhydrAMINE HCl (BENADRYL ALLERGY PO) Take 1 tablet by mouth as needed.    Marland Kitchen escitalopram (LEXAPRO) 10 MG tablet Take 10 mg by mouth daily.    . ferrous sulfate 325 (65 FE) MG tablet Take 325 mg by mouth every other day.    . furosemide (LASIX) 20 MG tablet Take 1 tablet (20 mg total) by mouth daily. Do not take until I call you about labs. (Patient taking differently: Take 20 mg by mouth as needed. Do not take until I call you about labs.) 30 tablet 0  . glucose blood test strip Use to check blood sugar 1 time per day. 100 each 3  . Insulin Pen Needle 32G X 4 MM MISC Used to inject insulin 3x daily 270 each 2  . levothyroxine (SYNTHROID, LEVOTHROID) 50 MCG tablet TAKE 1 TABLET DAILY BEFORE BREAKFAST 90 tablet 1  . lidocaine (LIDODERM) 5 % Place 1 patch onto the skin daily. Remove & Discard patch within 12 hours or as directed by MD (Patient taking differently: Place 2 patches onto the skin daily as needed (pain). Remove & Discard patch within 12 hours or as directed by MD) 90 patch 1  . OVER THE COUNTER MEDICATION Take 1 capsule by mouth daily. Recall Max memory supplement    . phenytoin (DILANTIN) 100 MG ER capsule TAKE 3 CAPSULES AT BEDTIME (Patient  taking differently: TAKE 3 CAPSULES AT BEDTIME. 2 capsules at bedtime and 1 in the morning) 270 capsule 3  . PHENYTOIN INFATABS 50 MG tablet CHEW 1 TABLET DAILY 90 tablet 3  . Polyethyl Glycol-Propyl Glycol 0.4-0.3 % SOLN Place 2 drops into both eyes at bedtime.     . primidone (MYSOLINE) 50 MG tablet  Take 1 tablet (50 mg total) by mouth daily. 30 tablet 5  . ranitidine (ZANTAC) 150 MG capsule Take 150 mg by mouth daily as needed for heartburn.     . traMADol (ULTRAM) 50 MG tablet Take 50 mg by mouth every 6 (six) hours as needed.     . trandolapril-verapamil (TARKA) 2-240 MG tablet Take 1 tablet by mouth daily.    Marland Kitchen JARDIANCE 25 MG TABS tablet Take 1 tablet by mouth daily.     No current facility-administered medications on file prior to visit.     Allergies  Allergen Reactions  . Bystolic [Nebivolol Hcl] Other (See Comments)    "extreme weakness, heaviness in legs & arms, swelling in legs/arms/face, swollen abdomen, pain in bladder, feet pain, soreness in chest"  . Cholestyramine     "itching rash on stomach, bloated, nausea, vomiting, sleeplessness, extreme pain in arms"  . Hydrazine Yellow [Tartrazine] Other (See Comments)    "does not reduce high blood pressure, pain in arm, high pressure, felt like I was on verge of heart attack, really weak"  . Morphine Other (See Comments)    Feels morbid, weak, still in pain  . Niacin Palpitations    Fast heart beat  . Niaspan [Niacin Er] Palpitations and Other (See Comments)    "fast heart beat, high blood pressure"  . Norvasc [Amlodipine Besylate] Other (See Comments)    "extreme fluid retention/pain)  . Optivar [Azelastine Hcl] Photosensitivity  . Repaglinide Hives  . Sular [Nisoldipine Er] Other (See Comments)    "severe headaches, swelling eyes, hands, feet, shortness of breath, weak, flushed face, brain boiling, fluid retention, high blood sugar, nervous, heart fast beating"  . Tegretol [Carbamazepine] Other (See Comments)    Blood  poisoning   . Telmisartan Other (See Comments)    "headache, difficulty urinating, high blood sugar, fluid retention"  . Cefdinir Swelling    Vaginal irritation, breathing,   . Clonidine Other (See Comments)    Dry mouth, fluid retention  . Clonidine Hydrochloride Other (See Comments)    Dry mouth, fluid retention  . Codeine Nausea And Vomiting  . Ezetimibe Other (See Comments)    Made weak  . Naproxen Other (See Comments)    Shrinks bladder  . Ziac [Bisoprolol-Hydrochlorothiazide] Other (See Comments)    "stopped urination"  . Elavil [Amitriptyline] Other (See Comments)    Gave Pt nightmares  . Keppra [Levetiracetam] Other (See Comments)    Shaking  . Lamictal [Lamotrigine]     itching  . Lyrica [Pregabalin] Swelling  . Topamax [Topiramate]     Dry eyes  . Ace Inhibitors Other (See Comments)    unknown  . Actonel [Risedronate Sodium] Other (See Comments)    unknown  . Amlodipine Besylate Other (See Comments)    unknown  . Aspirin Other (See Comments)    unknown  . Atacand [Candesartan] Other (See Comments)    unknown  . Bextra [Valdecoxib] Other (See Comments)    unknown  . Bisoprolol-Hydrochlorothiazide Other (See Comments)    unknown  . Candesartan Cilexetil Other (See Comments)    unknown  . Cefadroxil Other (See Comments)    unknown  . Celecoxib Rash  . Hydrocodone Other (See Comments)    unknown  . Hydrocodone-Acetaminophen Other (See Comments)    unknown  . Iodinated Diagnostic Agents Rash    "All over"  . Meloxicam Other (See Comments)    unknown  . Methylprednisolone Sodium Succinate Other (See Comments)    unknown  .  Nabumetone Other (See Comments)    unknown  . Penicillins Other (See Comments)    unknown  . Pseudoephedrine-Guaifenesin Other (See Comments)    unknown  . Risedronate Sodium Other (See Comments)    unknown  . Rofecoxib Other (See Comments)    unknown  . Ru-Tuss [Chlorphen-Pse-Atrop-Hyos-Scop] Other (See Comments)    unknown  .  Sulfonamide Derivatives Other (See Comments)    unknown  . Sulphur [Sulfur] Other (See Comments)    unknown  . Telithromycin Other (See Comments)    unknown  . Terfenadine Other (See Comments)    unknown  . Trandolapril-Verapamil Hcl Er Other (See Comments)    Headache, difficulty urinating, high blood sugar, fluid retention  Pt is taking Tarka (trandolapril-verapamil) currently, but requests the medication stay in her allergy list  . Valium [Diazepam] Other (See Comments)    Makes her mean and hyper    Family History  Problem Relation Age of Onset  . Heart disease Mother   . Osteoporosis Mother   . Diabetes Father   . Pancreatic cancer Father   . Colon cancer Unknown   . Bone cancer Sister   . Prostate cancer Brother   . Colon cancer Brother   . Rectal cancer Sister   . Thyroid disease Sister        benign goiter resected    BP 132/78 (BP Location: Left Arm, Patient Position: Sitting, Cuff Size: Normal)   Pulse (!) 55   Wt 139 lb (63 kg)   SpO2 95%   BMI 21.77 kg/m   Review of Systems Denies LOC    Objective:   Physical Exam Vital signs: see vs page.   Gen: elderly, frail, no distress.   Spine: no kyphosis.   Gait: normal and steady.   Pulses: foot pulses are intact bilaterally.   MSK: no deformity of the feet or ankles, except for hammer toes.   CV: no edema of the legs or ankles, but there are bilat vv's.   Skin:  no ulcer on the feet or ankles.  normal color and temp on the feet and ankles.   Neuro: sensation is intact to touch on the feet and ankles.   Lab Results  Component Value Date   HGBA1C 6.8 03/30/2017      Assessment & Plan:  Insulin-requiring type 2 DM, with PAD: well-controlled.  We'll need to add basal at some point.  Osteoporosis: due for recheck.   Patient Instructions  check your blood sugar twice a day.  vary the time of day when you check, between before the 3 meals, and at bedtime.  also check if you have symptoms of your blood  sugar being too high or too low.  please keep a record of the readings and bring it to your next appointment here (or you can bring the meter itself).  You can write it on any piece of paper.  please call us sooner if your blood sugar goes below 70, or if you have a lot of readings over 200.     Please continue the same insulin. Let's recheck the bone density test.  you will receive a phone call, about a day and time for an appointment. Please come back for a follow-up appointment in 3 months.

## 2017-04-01 ENCOUNTER — Telehealth: Payer: Self-pay | Admitting: Neurology

## 2017-04-01 MED ORDER — PRIMIDONE 50 MG PO TABS: 100.0000 mg | ORAL_TABLET | Freq: Every day | ORAL | 1 refills | Status: DC

## 2017-04-01 NOTE — Telephone Encounter (Signed)
I called the patient.  The patient indicates that the Mysoline at the 50 mg does work well for several weeks, but the tremors have returned.  She can go up on the dose to 100 mg at night.  She fell recently, but she indicates it is because that she turned her walker and the walker fell over, not noted any particular change in her balance.

## 2017-04-01 NOTE — Telephone Encounter (Signed)
I called the patient, I left a message, I will call back later. 

## 2017-04-01 NOTE — Telephone Encounter (Signed)
Pt called said she was doing good on primidone (MYSOLINE) 50 MG tablet until she fell at home on 03/17/17. Pt said she went to ED 03/23/17 for spams in the shoulder caused from the fall. Pt said since the fall she has been "nervous" on the inside and tremors in the hands again. Please call to advise

## 2017-04-05 ENCOUNTER — Ambulatory Visit
Admission: RE | Admit: 2017-04-05 | Discharge: 2017-04-05 | Disposition: A | Discharge status: Home / Self Care | Payer: Medicare Other | Source: Ambulatory Visit | Attending: Family Medicine | Admitting: Family Medicine

## 2017-04-05 ENCOUNTER — Other Ambulatory Visit: Payer: Self-pay | Admitting: Family Medicine

## 2017-04-05 DIAGNOSIS — N6310 Unspecified lump in the right breast, unspecified quadrant: Secondary | ICD-10-CM

## 2017-04-05 DIAGNOSIS — M19011 Primary osteoarthritis, right shoulder: Secondary | ICD-10-CM | POA: Diagnosis not present

## 2017-04-05 DIAGNOSIS — Z853 Personal history of malignant neoplasm of breast: Secondary | ICD-10-CM

## 2017-04-05 DIAGNOSIS — M25519 Pain in unspecified shoulder: Secondary | ICD-10-CM | POA: Diagnosis not present

## 2017-04-05 DIAGNOSIS — R921 Mammographic calcification found on diagnostic imaging of breast: Secondary | ICD-10-CM

## 2017-04-05 DIAGNOSIS — Z6821 Body mass index (BMI) 21.0-21.9, adult: Secondary | ICD-10-CM | POA: Diagnosis not present

## 2017-04-05 DIAGNOSIS — N6489 Other specified disorders of breast: Secondary | ICD-10-CM | POA: Diagnosis not present

## 2017-04-05 DIAGNOSIS — Z9181 History of falling: Secondary | ICD-10-CM | POA: Diagnosis not present

## 2017-04-05 DIAGNOSIS — Z9889 Other specified postprocedural states: Secondary | ICD-10-CM

## 2017-04-05 HISTORY — DX: Malignant neoplasm of unspecified site of unspecified female breast: C50.919

## 2017-04-05 HISTORY — DX: Personal history of irradiation: Z92.3

## 2017-04-06 ENCOUNTER — Ambulatory Visit (INDEPENDENT_AMBULATORY_CARE_PROVIDER_SITE_OTHER)
Admission: RE | Admit: 2017-04-06 | Discharge: 2017-04-06 | Disposition: A | Discharge status: Home / Self Care | Payer: Medicare Other | Source: Ambulatory Visit | Attending: Endocrinology | Admitting: Endocrinology

## 2017-04-06 DIAGNOSIS — M810 Age-related osteoporosis without current pathological fracture: Secondary | ICD-10-CM | POA: Diagnosis not present

## 2017-04-12 ENCOUNTER — Ambulatory Visit: Payer: Medicare Other | Admitting: Orthotics

## 2017-04-13 ENCOUNTER — Ambulatory Visit: Payer: Medicare Other | Admitting: Orthotics

## 2017-04-20 ENCOUNTER — Encounter: Payer: Self-pay | Admitting: Neurology

## 2017-04-20 ENCOUNTER — Telehealth: Payer: Self-pay | Admitting: Neurology

## 2017-04-20 NOTE — Telephone Encounter (Signed)
Pt called she needs a letter sent to [facility] [place]/long term care insurance stating she needs 28 hrs care per week. Pt said they are wanting to only give her 16 hrs, she needs 28hrs. Please call to advise

## 2017-04-20 NOTE — Telephone Encounter (Signed)
Pt called back. She said note needs to list: That is called an alternative plan of care her name as [name]   Policy # [id number]  Fax# [phone] [facility]  Attn: Claims Dept [place], [place] [address]

## 2017-04-20 NOTE — Telephone Encounter (Signed)
I called the patient.  The patient indicates that she needs 28 hours of aid and assistance in the home environment.  The patient indicates that the major reason for this is back pain, she can not be up on her feet for more than a few moments without having to sit down, she needs help with housework and cooking.  The patient indicates that the assistance will help her drive about town, but she does operate a Teacher, music independently, she is able to go to the grocery store and go to church on her own.  I will try to write a note to increase the number of hours that she is getting.  She will call back with the fax # for the long-term health care insurance.

## 2017-04-20 NOTE — Telephone Encounter (Signed)
Faxed letter as requested. Received fax confirmation.

## 2017-05-03 ENCOUNTER — Ambulatory Visit: Payer: TRICARE For Life (TFL) | Admitting: Podiatry

## 2017-05-04 ENCOUNTER — Emergency Department (HOSPITAL_COMMUNITY)
Admission: EM | Admit: 2017-05-04 | Discharge: 2017-05-04 | Disposition: A | Discharge status: Home / Self Care | Payer: Medicare Other | Attending: Emergency Medicine | Admitting: Emergency Medicine

## 2017-05-04 ENCOUNTER — Other Ambulatory Visit: Payer: Self-pay

## 2017-05-04 DIAGNOSIS — Z79899 Other long term (current) drug therapy: Secondary | ICD-10-CM | POA: Insufficient documentation

## 2017-05-04 DIAGNOSIS — W010XXA Fall on same level from slipping, tripping and stumbling without subsequent striking against object, initial encounter: Secondary | ICD-10-CM | POA: Insufficient documentation

## 2017-05-04 DIAGNOSIS — E119 Type 2 diabetes mellitus without complications: Secondary | ICD-10-CM | POA: Insufficient documentation

## 2017-05-04 DIAGNOSIS — M25559 Pain in unspecified hip: Secondary | ICD-10-CM | POA: Diagnosis not present

## 2017-05-04 DIAGNOSIS — Z853 Personal history of malignant neoplasm of breast: Secondary | ICD-10-CM | POA: Insufficient documentation

## 2017-05-04 DIAGNOSIS — Z794 Long term (current) use of insulin: Secondary | ICD-10-CM | POA: Diagnosis not present

## 2017-05-04 DIAGNOSIS — F419 Anxiety disorder, unspecified: Secondary | ICD-10-CM | POA: Diagnosis not present

## 2017-05-04 DIAGNOSIS — M533 Sacrococcygeal disorders, not elsewhere classified: Secondary | ICD-10-CM | POA: Insufficient documentation

## 2017-05-04 DIAGNOSIS — J45909 Unspecified asthma, uncomplicated: Secondary | ICD-10-CM | POA: Insufficient documentation

## 2017-05-04 DIAGNOSIS — Z9049 Acquired absence of other specified parts of digestive tract: Secondary | ICD-10-CM | POA: Insufficient documentation

## 2017-05-04 DIAGNOSIS — Z85828 Personal history of other malignant neoplasm of skin: Secondary | ICD-10-CM | POA: Diagnosis not present

## 2017-05-04 DIAGNOSIS — I10 Essential (primary) hypertension: Secondary | ICD-10-CM | POA: Insufficient documentation

## 2017-05-04 DIAGNOSIS — Z833 Family history of diabetes mellitus: Secondary | ICD-10-CM | POA: Diagnosis not present

## 2017-05-04 DIAGNOSIS — M545 Low back pain: Secondary | ICD-10-CM | POA: Diagnosis not present

## 2017-05-04 MED ORDER — ACETAMINOPHEN 500 MG PO TABS: 1000.0000 mg | ORAL_TABLET | Freq: Three times a day (TID) | ORAL | 0 refills | Status: AC

## 2017-05-04 MED ORDER — ACETAMINOPHEN 500 MG PO TABS
1000.0000 mg | ORAL_TABLET | Freq: Once | ORAL | Status: AC
  Administered 2017-05-04: 1000 mg via ORAL
  Filled 2017-05-04: qty 2

## 2017-05-04 NOTE — ED Triage Notes (Signed)
Pt seen at [facility] u/c today for left buttock area d/t fall 04-22-17.  Pt had xrays today and left lip xray shows need f/u with MRI.  Pt reports balance issues, was found to have brain tumor in 2015 but still has had balance issues.  When pt fell 04-22-17 she felt dizzy and reached out to grab something to hold on to but nothing was there.

## 2017-05-04 NOTE — ED Provider Notes (Signed)
[facility] EMERGENCY DEPARTMENT Provider Note  CSN: [id number] Arrival date & time: 05/04/17 1252  Chief Complaint(s) Back Pain  HPI [name] is a 84 y.o. female who presents to the emergency department for tailbone pain following a mechanical fall 12 days ago.  Patient was evaluated by urgent care earlier today and had plain film which did not reveal any acute fractures however radiologist recommended MRI if patient had persistent symptoms.  Urgent care physician sent the patient here to the emergency department for MRI.  Patient reports that she has been taken Tylenol and tramadol for the pain with some improvement however pain still persist.  Her pain is exacerbated with sitting, lying, ambulation.  The pain is isolated to the tailbone region.  She denies any bladder/bowel incontinence.  She denies any lower extremity weakness or loss of sensation.  Denies any other physical complaints.  No other alleviating or aggravating factors.  HPI  Past Medical History Past Medical History:  Diagnosis Date  . Anemia   . Anxiety   . Breast cancer ([place]) 2014   right breast  . Cancer of right breast ([place]) 12/26/12   right breast 12:00 o'clock, DCIS  . Chronic cough   . Chronic facial pain   . Chronic foot pain   . Complication of anesthesia    Sore jaw; could not chew or move mouth  . Convulsions/seizures ([place]) 10/16/2014  . Diabetes mellitus    type 2 niddm x 20 years  . Dyslipidemia   . Ejection fraction   . GERD (gastroesophageal reflux disease)   . Hammer toe    bilateral  . History of colonic polyps   . HTN (hypertension)   . Hx of radiation therapy 03/07/13- 03/29/13   right breast 4250 cGy 17 sessions  . Hyperlipidemia   . Melanoma ([place])   . Metatarsal bone fracture 2014  . Obesity   . Osteoarthritis   . Palpitations   . Personal history of radiation therapy 2014  . Seasonal allergies   . Skin cancer   . Tremor, essential 08/18/2016  . Vitamin B12  deficiency    Patient Active Problem List   Diagnosis Date Noted  . Dysuria 01/19/2017  . Vitamin D deficiency 11/18/2016  . Tremor, essential 08/18/2016  . Convulsions/seizures ([place]) 10/16/2014  . Brain tumor (benign) ([place]) 10/18/2013  . Subdural hemorrhage ([place]) 10/09/2013  . Anxiety 10/09/2013  . Compression fracture of T12 vertebra ([place]) 10/09/2013  . Ejection fraction   . Multiple thyroid nodules 09/05/2013  . Syncope 08/27/2013  . Carotid artery disease ([place]) 08/27/2013  . Abnormal thyroid ultrasound 08/27/2013  . Cancer of right breast ([place])   . Cancer of central portion of female breast ([place]) 12/30/2012  . Osteoporosis 03/06/2010  . ASTHMA 03/05/2009  . IRRITABLE BOWEL SYNDROME  diarrhea type 03/21/2008  . ANEMIA-NOS 12/16/2006  . GERD 12/16/2006  . Diabetes ([place]) 10/29/2006  . Hyperlipidemia 10/29/2006  . Essential hypertension 10/29/2006  . Allergic rhinitis, cause unspecified 10/29/2006  . OSTEOARTHRITIS 10/29/2006   Home Medication(s) Prior to Admission medications   Medication Sig Start Date End Date Taking? Authorizing Provider  acetaminophen (TYLENOL) 500 MG tablet Take 2 tablets (1,000 mg total) by mouth every 8 (eight) hours for 5 days. Do not take more than 4000 mg of acetaminophen (Tylenol) in a 24-hour period. Please note that other medicines that you Espiritu be prescribed [place] have Tylenol as well. 05/04/17 05/09/17  Fatima Blank, MD  ALPRAZolam Duanne Moron) 0.25 MG  tablet Take 1 tablet (0.25 mg total) by mouth at bedtime. 03/02/17   Kathrynn Ducking, MD  atorvastatin (LIPITOR) 20 MG tablet Take 1 tablet by mouth daily. 08/10/16   [provider]  Biotin 1000 MCG tablet Take 1,000 mcg by mouth daily.     [provider]  Blood Glucose Monitoring Suppl (PRODIGY VOICE BLOOD GLUCOSE) w/Device KIT Use to check blood sugar 1 time per day. 10/18/15   Renato Shin, MD  calcitonin, salmon, (MIACALCIN/FORTICAL) 200 UNIT/ACT nasal spray Place 1 spray into  alternate nostrils daily. 06/09/16   [provider]  Cholecalciferol 5000 units capsule Take 5,000 Units by mouth daily.    [provider]  colestipol (COLESTID) 1 G tablet Take 1-2 g by mouth 3 (three) times daily. Take 2 tablets in the morning, 1 tablet at noon, and 2 tablets in the evening    [provider]  cyanocobalamin (,VITAMIN B-12,) 1000 MCG/ML injection INJECT 1 ML INTRAMUSCULARLY EVERY 21 DAYS 06/18/16   Hoyt Koch, MD  DiphenhydrAMINE HCl (BENADRYL ALLERGY PO) Take 1 tablet by mouth as needed.    [provider]  escitalopram (LEXAPRO) 10 MG tablet Take 10 mg by mouth daily.    [provider]  ferrous sulfate 325 (65 FE) MG tablet Take 325 mg by mouth every other day.    [provider]  furosemide (LASIX) 20 MG tablet Take 1 tablet (20 mg total) by mouth daily. Do not take until I call you about labs. Patient taking differently: Take 20 mg by mouth as needed. Do not take until I call you about labs. 12/27/13   Marin Olp, MD  glucose blood test strip Use to check blood sugar 1 time per day. 10/22/15   Renato Shin, MD  insulin lispro (HUMALOG KWIKPEN) 100 UNIT/ML KiwkPen Inject 0.07 mLs (7 Units total) into the skin 3 (three) times daily with meals. And pen needles 1/day 03/30/17   Renato Shin, MD  Insulin Pen Needle 32G X 4 MM MISC Used to inject insulin 3x daily 11/19/16   Renato Shin, MD  JARDIANCE 25 MG TABS tablet Take 1 tablet by mouth daily. 08/31/16   [provider]  levothyroxine (SYNTHROID, LEVOTHROID) 50 MCG tablet TAKE 1 TABLET DAILY BEFORE BREAKFAST 06/14/16   Renato Shin, MD  lidocaine (LIDODERM) 5 % Place 1 patch onto the skin daily. Remove & Discard patch within 12 hours or as directed by MD Patient taking differently: Place 2 patches onto the skin daily as needed (pain). Remove & Discard patch within 12 hours or as directed by MD 01/25/15   Hoyt Koch, MD  OVER THE COUNTER  MEDICATION Take 1 capsule by mouth daily. Recall Max memory supplement    [provider]  phenytoin (DILANTIN) 100 MG ER capsule TAKE 3 CAPSULES AT BEDTIME Patient taking differently: TAKE 3 CAPSULES AT BEDTIME. 2 capsules at bedtime and 1 in the morning 08/10/16   Kathrynn Ducking, MD  PHENYTOIN INFATABS 50 MG tablet CHEW 1 TABLET DAILY 04/22/16   Kathrynn Ducking, MD  Polyethyl Glycol-Propyl Glycol 0.4-0.3 % SOLN Place 2 drops into both eyes at bedtime.     [provider]  primidone (MYSOLINE) 50 MG tablet Take 2 tablets (100 mg total) by mouth daily. 04/01/17   Kathrynn Ducking, MD  ranitidine (ZANTAC) 150 MG capsule Take 150 mg by mouth daily as needed for heartburn.     [provider]  traMADol (ULTRAM) 50 MG  tablet Take 50 mg by mouth every 6 (six) hours as needed.     [provider]  trandolapril-verapamil (TARKA) 2-240 MG tablet Take 1 tablet by mouth daily.    [provider]                                                                                                                                    Past Surgical History Past Surgical History:  Procedure Laterality Date  . ABDOMINAL HYSTERECTOMY    . BRAIN SURGERY    . BREAST BIOPSY Right 01/24/2013   Procedure: RE-EXCICION OF BREAST CANCER, ANTERIOR MARGINS;  Surgeon: Edward Jolly, MD;  Location: [facility];  Service: General;  Laterality: Right;  . BREAST LUMPECTOMY Right 2014  . BREAST LUMPECTOMY WITH NEEDLE LOCALIZATION Right 01/17/2013   Procedure: BREAST LUMPECTOMY WITH NEEDLE LOCALIZATION;  Surgeon: Edward Jolly, MD;  Location: [facility];  Service: General;  Laterality: Right;  . BUNIONECTOMY    . CATARACT EXTRACTION W/ INTRAOCULAR LENS IMPLANT Right   . CHOLECYSTECTOMY    . COLONOSCOPY    . CRANIOTOMY Right 10/18/2013   Procedure: CRANIOTOMY TUMOR EXCISION;  Surgeon: Floyce Stakes, MD;  Location: [facility];  Service: Neurosurgery;  Laterality: Right;  . EYE  SURGERY    . HERNIA REPAIR    . KNEE ARTHROSCOPY Bilateral   . POLYPECTOMY     small adenomatous  . ULNAR TUNNEL RELEASE     Family History Family History  Problem Relation Age of Onset  . Heart disease Mother   . Osteoporosis Mother   . Diabetes Father   . Pancreatic cancer Father   . Colon cancer Unknown   . Bone cancer Sister   . Prostate cancer Brother   . Colon cancer Brother   . Rectal cancer Sister   . Thyroid disease Sister        benign goiter resected    Social History Social History   Tobacco Use  . Smoking status: Never Smoker  . Smokeless tobacco: Never Used  Substance Use Topics  . Alcohol use: No  . Drug use: No   Allergies Bystolic [nebivolol hcl]; Cholestyramine; Hydrazine yellow [tartrazine]; Morphine; Niacin; Niaspan [niacin er]; Norvasc [amlodipine besylate]; Optivar [azelastine hcl]; Repaglinide; Sular [nisoldipine er]; Tegretol [carbamazepine]; Telmisartan; Cefdinir; Clonidine; Clonidine hydrochloride; Codeine; Ezetimibe; Naproxen; Ziac [bisoprolol-hydrochlorothiazide]; Elavil [amitriptyline]; Keppra [levetiracetam]; Lamictal [lamotrigine]; Lyrica [pregabalin]; Topamax [topiramate]; Ace inhibitors; Actonel [risedronate sodium]; Amlodipine besylate; Aspirin; Atacand [candesartan]; Bextra [valdecoxib]; Bisoprolol-hydrochlorothiazide; Candesartan cilexetil; Cefadroxil; Celecoxib; Hydrocodone; Hydrocodone-acetaminophen; Iodinated diagnostic agents; Meloxicam; Methylprednisolone sodium succinate; Nabumetone; Penicillins; Pseudoephedrine-guaifenesin; Risedronate sodium; Rofecoxib; Ru-tuss [chlorphen-pse-atrop-hyos-scop]; Sulfonamide derivatives; Why [sulfur]; Telithromycin; Terfenadine; Trandolapril-verapamil hcl er; and Valium [diazepam]  Review of Systems Review of Systems All other systems are reviewed and are negative for acute change except as noted in the HPI  Physical Exam Vital Signs  I have reviewed the triage vital signs BP (!) 163/75 (BP  Location:  Left Arm)   Pulse 72   Temp 98.4 F (36.9 C) (Oral)   Resp 18   SpO2 96%   Physical Exam  Constitutional: She is oriented to person, place, and time. She appears well-developed and well-nourished. No distress.  HENT:  Head: Normocephalic and atraumatic.  Right Ear: External ear normal.  Left Ear: External ear normal.  Nose: Nose normal.  Eyes: Conjunctivae and EOM are normal. No scleral icterus.  Neck: Normal range of motion and phonation normal.  Cardiovascular: Normal rate and regular rhythm.  Pulmonary/Chest: Effort normal. No stridor. No respiratory distress.  Abdominal: She exhibits no distension.  Musculoskeletal: Normal range of motion. She exhibits no edema.       Lumbar back: She exhibits bony tenderness.       Back:  Neurological: She is alert and oriented to person, place, and time.  Spine Exam: Strength: 5/5 throughout LE bilaterally (hip flexion/extension, adduction/abduction; knee flexion/extension; foot dorsiflexion/plantarflexion, inversion/eversion; great toe inversion) Sensation: Intact to light touch in proximal and distal LE bilaterally Antalgic gait.    Skin: She is not diaphoretic.  Psychiatric: She has a normal mood and affect. Her behavior is normal.  Vitals reviewed.   ED Results and Treatments Labs (all labs ordered are listed, but only abnormal results are displayed) Labs Reviewed - No data to display                                                                                                                       EKG  EKG Interpretation  Date/Time:    Ventricular Rate:    PR Interval:    QRS Duration:   QT Interval:    QTC Calculation:   R Axis:     Text Interpretation:        Radiology No results found. Pertinent labs & imaging results that were available during my care of the patient were reviewed by me and considered in my medical decision making (see chart for details).  Medications Ordered in ED Medications    acetaminophen (TYLENOL) tablet 1,000 mg (1,000 mg Oral Given 05/04/17 2017)                                                                                                                                    Procedures Procedures  (including critical care time)  Medical Decision Making / ED Course I have reviewed the nursing notes for this  encounter and the patient's prior records (if available in EHR or on provided paperwork).    Patient presents with tailbone pain following mechanical fall almost 2 weeks ago.  Patient brought the read from plain film from urgent care which showed no acute fractures.  On history and exam there is no suspicion for cauda equina.  I had a lengthy discussion with the patient and family member regarding additional imaging.  With shared decision making we felt that MRI or CT scan would not be beneficial at this time, due to the fact that there would be no change in management.   Recommended continued supportive management including ice and follow-up with her primary care provider and chronic pain specialist.  The patient appears reasonably screened and/or stabilized for discharge and I doubt any other medical condition or other [facility] requiring further screening, evaluation, or treatment in the ED at this time prior to discharge.   Final Clinical Impression(s) / ED Diagnoses Final diagnoses:  Tail bone pain    Disposition: Discharge  Condition: Good  I have discussed the results, Dx and Tx plan with the patient who expressed understanding and agree(s) with the plan. Discharge instructions discussed at great length. The patient was given strict return precautions who verbalized understanding of the instructions. No further questions at time of discharge.    ED Discharge Orders        Ordered    acetaminophen (TYLENOL) 500 MG tablet  Every 8 hours     05/04/17 2031       Follow Up: [name], MD [address] [place]  [address] [phone]  Schedule an appointment as soon as possible for a visit in 2 weeks If symptoms do not improve or  worsen    This chart was dictated using voice recognition software.  Despite best efforts to proofread,  errors can occur which can change the documentation meaning.   Fatima Blank, MD 05/04/17 2041

## 2017-05-11 ENCOUNTER — Encounter: Payer: Self-pay | Admitting: Endocrinology

## 2017-05-11 ENCOUNTER — Telehealth: Payer: Self-pay | Admitting: Endocrinology

## 2017-05-11 ENCOUNTER — Ambulatory Visit (INDEPENDENT_AMBULATORY_CARE_PROVIDER_SITE_OTHER): Payer: Medicare Other | Admitting: Podiatry

## 2017-05-11 DIAGNOSIS — E1142 Type 2 diabetes mellitus with diabetic polyneuropathy: Secondary | ICD-10-CM | POA: Diagnosis not present

## 2017-05-11 DIAGNOSIS — M79675 Pain in left toe(s): Secondary | ICD-10-CM

## 2017-05-11 DIAGNOSIS — L84 Corns and callosities: Secondary | ICD-10-CM | POA: Diagnosis not present

## 2017-05-11 DIAGNOSIS — E0842 Diabetes mellitus due to underlying condition with diabetic polyneuropathy: Secondary | ICD-10-CM

## 2017-05-11 DIAGNOSIS — E1151 Type 2 diabetes mellitus with diabetic peripheral angiopathy without gangrene: Secondary | ICD-10-CM

## 2017-05-11 DIAGNOSIS — M79674 Pain in right toe(s): Secondary | ICD-10-CM

## 2017-05-11 NOTE — Telephone Encounter (Signed)
Patient came in today requesting Dr Loanne Drilling immediately sign the paperwork for the patient's therapeutic shoes. She became upset with Loma Sousa and told Loma Sousa to "get off her ass and bring the paperwork to Dr. Loanne Drilling" like she is telling her to do. Courtney then came and immediately asked for assistance from [employer]. Colletta Maryland walked out and spoke with the patient explained that we would get the paperwork to Dr. Loanne Drilling but that it could take up to 48 hours to complete. She was not as visibly upset with Colletta Maryland. Colletta Maryland took the pt's number and let her know we would call her with Dr. Rosario Adie determination and if it is completed we will send it to her foot MD for her.

## 2017-05-11 NOTE — Progress Notes (Addendum)
Subjective: 84 y.o. returns the office today for painful, elongated, thickened toenails which she cannot trim herself. Denies any redness or drainage around the nails. She also has calluses to both of her feet that are thick and painful. No swelling, redness, drainage. Denies any acute changes since last appointment and no new complaints today. Denies any systemic complaints such as fevers, chills, nausea, vomiting.   She is awaiting diabetic shoes  PCP: [name], MD   Objective: AAO 3, NAD DP/PT pulses palpable but somewhat decreased, CRT less than 3 seconds Protective sensation decreased with Simms Weinstein monofilament Nails hypertrophic, dystrophic, elongated, brittle, discolored 10. There is tenderness overlying the nails 1-5 bilaterally. There is no surrounding erythema or drainage along the nail sites. Hyperkeratotic lesion bilateral submetatarsal 5 as well as left distal second toe.  Upon debridement there is no underlying ulceration, drainage or any signs of infection noted. No open lesions or pre-ulcerative lesions are identified. Prominent metatarsal heads plantarly with atrophy of the fat pad but hammertoe contractures present. No other areas of tenderness bilateral lower extremities. No overlying edema, erythema, increased warmth. No pain with calf compression, swelling, warmth, erythema.  Assessment: Patient presents with symptomatic onychomycosis; hyperkeratotic lesions  Plan: -Treatment options including alternatives, risks, complications were discussed -Nails sharply debrided 10 without complication/bleeding. -Hyperkeratotic pre-ulcerative lesions were sharply debrided today without any complications or bleeding x3. -Awaiting certification for diabetic shoes -Discussed daily foot inspection. If there are any changes, to call the office immediately.  -Follow-up in 3 months or sooner if any problems are to arise. In the meantime, encouraged to call the office  with any questions, concerns, changes symptoms.  Celesta Gentile, DPM

## 2017-05-11 NOTE — Telephone Encounter (Signed)
I called [employer] directly to see if they could take just the one form signed but according to medicare guidelines they can't. I called patient & stated that Dr. Loanne Drilling was not going to sign other form. I gave her information & number of [employer] maybe they could fit her for shoes. I apologized & stated that there was nothing more we could do.

## 2017-05-11 NOTE — Telephone Encounter (Signed)
This form has previously been addressed.

## 2017-05-12 ENCOUNTER — Telehealth: Payer: Self-pay | Admitting: Podiatry

## 2017-05-12 NOTE — Telephone Encounter (Signed)
Late entry from 1.8.19  Talked with pt and Dr Jacqualyn Posey has changed dx code and we are refaxing paperwork to Dr Loanne Drilling to see if he will agree with [place] so we can get her shoes. Pt was thankful for this and I told her I would keep her updated.

## 2017-05-17 DIAGNOSIS — M546 Pain in thoracic spine: Secondary | ICD-10-CM | POA: Diagnosis not present

## 2017-05-20 ENCOUNTER — Telehealth: Payer: Self-pay | Admitting: Endocrinology

## 2017-05-20 NOTE — Telephone Encounter (Signed)
Patient dismissed from [facility] by Renato Shin MD , effective May 11, 2017. Dismissal letter sent out by certified / registered mail. [email or web address]

## 2017-05-21 DIAGNOSIS — J069 Acute upper respiratory infection, unspecified: Secondary | ICD-10-CM | POA: Diagnosis not present

## 2017-05-21 DIAGNOSIS — Z6822 Body mass index (BMI) 22.0-22.9, adult: Secondary | ICD-10-CM | POA: Diagnosis not present

## 2017-05-24 ENCOUNTER — Telehealth: Payer: Self-pay | Admitting: Endocrinology

## 2017-05-24 ENCOUNTER — Other Ambulatory Visit: Payer: Self-pay

## 2017-05-24 MED ORDER — INSULIN LISPRO 100 UNIT/ML (KWIKPEN): 7.0000 [IU] | PEN_INJECTOR | Freq: Three times a day (TID) | SUBCUTANEOUS | 3 refills | Status: DC

## 2017-05-24 NOTE — Telephone Encounter (Signed)
Done

## 2017-05-24 NOTE — Telephone Encounter (Signed)
Patient needs script for Humalog Quick Pen-7 units-3xday sent to [facility]. Patient is out of medication

## 2017-05-28 DIAGNOSIS — Z748 Other problems related to care provider dependency: Secondary | ICD-10-CM | POA: Diagnosis not present

## 2017-05-28 DIAGNOSIS — W19XXXA Unspecified fall, initial encounter: Secondary | ICD-10-CM | POA: Diagnosis not present

## 2017-05-28 DIAGNOSIS — J069 Acute upper respiratory infection, unspecified: Secondary | ICD-10-CM | POA: Diagnosis not present

## 2017-05-28 DIAGNOSIS — R6889 Other general symptoms and signs: Secondary | ICD-10-CM | POA: Diagnosis not present

## 2017-06-01 DIAGNOSIS — I10 Essential (primary) hypertension: Secondary | ICD-10-CM | POA: Diagnosis not present

## 2017-06-01 DIAGNOSIS — M545 Low back pain: Secondary | ICD-10-CM | POA: Diagnosis not present

## 2017-06-01 DIAGNOSIS — M546 Pain in thoracic spine: Secondary | ICD-10-CM | POA: Diagnosis not present

## 2017-06-02 ENCOUNTER — Telehealth: Payer: Self-pay | Admitting: Podiatry

## 2017-06-02 DIAGNOSIS — M8008XD Age-related osteoporosis with current pathological fracture, vertebra(e), subsequent encounter for fracture with routine healing: Secondary | ICD-10-CM | POA: Diagnosis not present

## 2017-06-02 DIAGNOSIS — I10 Essential (primary) hypertension: Secondary | ICD-10-CM | POA: Diagnosis not present

## 2017-06-02 DIAGNOSIS — F419 Anxiety disorder, unspecified: Secondary | ICD-10-CM | POA: Diagnosis not present

## 2017-06-02 DIAGNOSIS — E1142 Type 2 diabetes mellitus with diabetic polyneuropathy: Secondary | ICD-10-CM | POA: Diagnosis not present

## 2017-06-02 DIAGNOSIS — M545 Low back pain: Secondary | ICD-10-CM | POA: Diagnosis not present

## 2017-06-02 DIAGNOSIS — R296 Repeated falls: Secondary | ICD-10-CM | POA: Diagnosis not present

## 2017-06-02 NOTE — Telephone Encounter (Signed)
Pt left voicemail checking on diabetic shoes.And asked if you could recommend a endocrinologist.   I returned call and told pt Dr Loanne Drilling did not sign off on pt getting the shoes. I told her you were out of the office and I could not recommend an endocrinogist at this time.[name] I did ask her if she has a pcp that she is currently seeing and maybe seeing if they would sign off on the diabetic shoes. Pt states she has an appt with her pcp on 2.11.19 and I told her to ask her if she would sign off on the shoes this time for 2019 and to see if her pcp had any recommendations for a new endocrinologist.[name] and I will change the paperwork and fax it to them.

## 2017-06-02 NOTE — Telephone Encounter (Signed)
Let her know that that her PCP should make the referral to endocrinology. If she needs a recommendation I would say Dr. Buddy Duty.

## 2017-06-03 NOTE — Telephone Encounter (Signed)
Received signed domestic return receipt verifying delivery of certified letter on May 31, 2017. Article number [phone] [id number]

## 2017-06-07 DIAGNOSIS — I10 Essential (primary) hypertension: Secondary | ICD-10-CM | POA: Diagnosis not present

## 2017-06-07 DIAGNOSIS — M8008XD Age-related osteoporosis with current pathological fracture, vertebra(e), subsequent encounter for fracture with routine healing: Secondary | ICD-10-CM | POA: Diagnosis not present

## 2017-06-07 DIAGNOSIS — E1142 Type 2 diabetes mellitus with diabetic polyneuropathy: Secondary | ICD-10-CM | POA: Diagnosis not present

## 2017-06-07 DIAGNOSIS — M545 Low back pain: Secondary | ICD-10-CM | POA: Diagnosis not present

## 2017-06-07 DIAGNOSIS — R296 Repeated falls: Secondary | ICD-10-CM | POA: Diagnosis not present

## 2017-06-07 DIAGNOSIS — F419 Anxiety disorder, unspecified: Secondary | ICD-10-CM | POA: Diagnosis not present

## 2017-06-08 ENCOUNTER — Other Ambulatory Visit: Payer: Self-pay | Admitting: Neurology

## 2017-06-08 DIAGNOSIS — M810 Age-related osteoporosis without current pathological fracture: Secondary | ICD-10-CM | POA: Diagnosis not present

## 2017-06-08 DIAGNOSIS — E782 Mixed hyperlipidemia: Secondary | ICD-10-CM | POA: Diagnosis not present

## 2017-06-08 DIAGNOSIS — Z79899 Other long term (current) drug therapy: Secondary | ICD-10-CM | POA: Diagnosis not present

## 2017-06-09 DIAGNOSIS — E1142 Type 2 diabetes mellitus with diabetic polyneuropathy: Secondary | ICD-10-CM | POA: Diagnosis not present

## 2017-06-09 DIAGNOSIS — F419 Anxiety disorder, unspecified: Secondary | ICD-10-CM | POA: Diagnosis not present

## 2017-06-09 DIAGNOSIS — M8008XD Age-related osteoporosis with current pathological fracture, vertebra(e), subsequent encounter for fracture with routine healing: Secondary | ICD-10-CM | POA: Diagnosis not present

## 2017-06-09 DIAGNOSIS — I10 Essential (primary) hypertension: Secondary | ICD-10-CM | POA: Diagnosis not present

## 2017-06-09 DIAGNOSIS — R296 Repeated falls: Secondary | ICD-10-CM | POA: Diagnosis not present

## 2017-06-09 DIAGNOSIS — M545 Low back pain: Secondary | ICD-10-CM | POA: Diagnosis not present

## 2017-06-14 DIAGNOSIS — E559 Vitamin D deficiency, unspecified: Secondary | ICD-10-CM | POA: Diagnosis not present

## 2017-06-14 DIAGNOSIS — E782 Mixed hyperlipidemia: Secondary | ICD-10-CM | POA: Diagnosis not present

## 2017-06-14 DIAGNOSIS — F411 Generalized anxiety disorder: Secondary | ICD-10-CM | POA: Diagnosis not present

## 2017-06-14 DIAGNOSIS — E1142 Type 2 diabetes mellitus with diabetic polyneuropathy: Secondary | ICD-10-CM | POA: Diagnosis not present

## 2017-06-14 DIAGNOSIS — Z6822 Body mass index (BMI) 22.0-22.9, adult: Secondary | ICD-10-CM | POA: Diagnosis not present

## 2017-06-14 DIAGNOSIS — M545 Low back pain: Secondary | ICD-10-CM | POA: Diagnosis not present

## 2017-06-14 DIAGNOSIS — R296 Repeated falls: Secondary | ICD-10-CM | POA: Diagnosis not present

## 2017-06-14 DIAGNOSIS — I10 Essential (primary) hypertension: Secondary | ICD-10-CM | POA: Diagnosis not present

## 2017-06-14 DIAGNOSIS — F419 Anxiety disorder, unspecified: Secondary | ICD-10-CM | POA: Diagnosis not present

## 2017-06-14 DIAGNOSIS — M8008XD Age-related osteoporosis with current pathological fracture, vertebra(e), subsequent encounter for fracture with routine healing: Secondary | ICD-10-CM | POA: Diagnosis not present

## 2017-06-15 DIAGNOSIS — M545 Low back pain: Secondary | ICD-10-CM | POA: Diagnosis not present

## 2017-06-15 DIAGNOSIS — I10 Essential (primary) hypertension: Secondary | ICD-10-CM | POA: Diagnosis not present

## 2017-06-15 DIAGNOSIS — M546 Pain in thoracic spine: Secondary | ICD-10-CM | POA: Diagnosis not present

## 2017-06-16 DIAGNOSIS — M8008XD Age-related osteoporosis with current pathological fracture, vertebra(e), subsequent encounter for fracture with routine healing: Secondary | ICD-10-CM | POA: Diagnosis not present

## 2017-06-16 DIAGNOSIS — M545 Low back pain: Secondary | ICD-10-CM | POA: Diagnosis not present

## 2017-06-16 DIAGNOSIS — E1142 Type 2 diabetes mellitus with diabetic polyneuropathy: Secondary | ICD-10-CM | POA: Diagnosis not present

## 2017-06-16 DIAGNOSIS — I10 Essential (primary) hypertension: Secondary | ICD-10-CM | POA: Diagnosis not present

## 2017-06-16 DIAGNOSIS — R296 Repeated falls: Secondary | ICD-10-CM | POA: Diagnosis not present

## 2017-06-16 DIAGNOSIS — F419 Anxiety disorder, unspecified: Secondary | ICD-10-CM | POA: Diagnosis not present

## 2017-06-21 ENCOUNTER — Ambulatory Visit (INDEPENDENT_AMBULATORY_CARE_PROVIDER_SITE_OTHER): Payer: Medicare Other | Admitting: Podiatry

## 2017-06-21 ENCOUNTER — Encounter: Payer: Self-pay | Admitting: Podiatry

## 2017-06-21 DIAGNOSIS — E1142 Type 2 diabetes mellitus with diabetic polyneuropathy: Secondary | ICD-10-CM | POA: Diagnosis not present

## 2017-06-21 DIAGNOSIS — Q828 Other specified congenital malformations of skin: Secondary | ICD-10-CM

## 2017-06-21 DIAGNOSIS — M79674 Pain in right toe(s): Secondary | ICD-10-CM | POA: Diagnosis not present

## 2017-06-21 DIAGNOSIS — E1151 Type 2 diabetes mellitus with diabetic peripheral angiopathy without gangrene: Secondary | ICD-10-CM

## 2017-06-21 DIAGNOSIS — F419 Anxiety disorder, unspecified: Secondary | ICD-10-CM | POA: Diagnosis not present

## 2017-06-21 DIAGNOSIS — M79675 Pain in left toe(s): Secondary | ICD-10-CM | POA: Diagnosis not present

## 2017-06-21 DIAGNOSIS — M79676 Pain in unspecified toe(s): Secondary | ICD-10-CM | POA: Diagnosis not present

## 2017-06-21 DIAGNOSIS — M545 Low back pain: Secondary | ICD-10-CM | POA: Diagnosis not present

## 2017-06-21 DIAGNOSIS — R296 Repeated falls: Secondary | ICD-10-CM | POA: Diagnosis not present

## 2017-06-21 DIAGNOSIS — I10 Essential (primary) hypertension: Secondary | ICD-10-CM | POA: Diagnosis not present

## 2017-06-21 DIAGNOSIS — B351 Tinea unguium: Secondary | ICD-10-CM | POA: Diagnosis not present

## 2017-06-21 DIAGNOSIS — M8008XD Age-related osteoporosis with current pathological fracture, vertebra(e), subsequent encounter for fracture with routine healing: Secondary | ICD-10-CM | POA: Diagnosis not present

## 2017-06-23 DIAGNOSIS — R296 Repeated falls: Secondary | ICD-10-CM | POA: Diagnosis not present

## 2017-06-23 DIAGNOSIS — E1142 Type 2 diabetes mellitus with diabetic polyneuropathy: Secondary | ICD-10-CM | POA: Diagnosis not present

## 2017-06-23 DIAGNOSIS — I10 Essential (primary) hypertension: Secondary | ICD-10-CM | POA: Diagnosis not present

## 2017-06-23 DIAGNOSIS — M545 Low back pain: Secondary | ICD-10-CM | POA: Diagnosis not present

## 2017-06-23 DIAGNOSIS — F419 Anxiety disorder, unspecified: Secondary | ICD-10-CM | POA: Diagnosis not present

## 2017-06-23 DIAGNOSIS — M8008XD Age-related osteoporosis with current pathological fracture, vertebra(e), subsequent encounter for fracture with routine healing: Secondary | ICD-10-CM | POA: Diagnosis not present

## 2017-06-23 NOTE — Progress Notes (Signed)
Subjective: 84 y.o. returns the office today for painful, elongated, thickened toenails which she cannot trim herself. Denies any redness or drainage around the nails. She also has calluses to both of her feet that are thick and painful. No swelling, redness, drainage. Denies any acute changes since last appointment and no new complaints today. Denies any systemic complaints such as fevers, chills, nausea, vomiting.   She is awaiting diabetic shoes  Objective: AAO 3, NAD DP/PT pulses palpable but somewhat decreased, CRT less than 3 seconds Protective sensation decreased with Simms Weinstein monofilament Nails hypertrophic, dystrophic, elongated, brittle, discolored 10. There is tenderness overlying the nails 1-5 bilaterally. There is no surrounding erythema or drainage along the nail sites. Hyperkeratotic lesions bilateral submetatarsal.  Upon debridement there is no underlying ulceration, drainage or signs of infection. No other open lesions or pre-ulcerative lesions are identified. Prominent metatarsal heads plantarly with atrophy of the fat pad but hammertoe contractures present. No other areas of tenderness bilateral lower extremities. No overlying edema, erythema, increased warmth. No pain with calf compression, swelling, warmth, erythema.  Assessment: Patient presents with symptomatic onychomycosis; hyperkeratotic lesions  Plan: -Treatment options including alternatives, risks, complications were discussed -Nails sharply debrided 10 without complication/bleeding. -Hyperkeratotic pre-ulcerative lesions were sharply debrided today without any complications or bleeding x2 -Awaiting diabetic shoes -Discussed daily foot inspection. If there are any changes, to call the office immediately.  -Follow-up in 3 months or sooner if any problems are to arise. In the meantime, encouraged to call the office with any questions, concerns, changes symptoms.  Celesta Gentile, DPM

## 2017-06-28 DIAGNOSIS — M8008XD Age-related osteoporosis with current pathological fracture, vertebra(e), subsequent encounter for fracture with routine healing: Secondary | ICD-10-CM | POA: Diagnosis not present

## 2017-06-28 DIAGNOSIS — R296 Repeated falls: Secondary | ICD-10-CM | POA: Diagnosis not present

## 2017-06-28 DIAGNOSIS — M545 Low back pain: Secondary | ICD-10-CM | POA: Diagnosis not present

## 2017-06-28 DIAGNOSIS — E1142 Type 2 diabetes mellitus with diabetic polyneuropathy: Secondary | ICD-10-CM | POA: Diagnosis not present

## 2017-06-28 DIAGNOSIS — I10 Essential (primary) hypertension: Secondary | ICD-10-CM | POA: Diagnosis not present

## 2017-06-28 DIAGNOSIS — F419 Anxiety disorder, unspecified: Secondary | ICD-10-CM | POA: Diagnosis not present

## 2017-06-29 ENCOUNTER — Ambulatory Visit: Payer: Medicare Other | Admitting: Endocrinology

## 2017-07-01 DIAGNOSIS — M545 Low back pain: Secondary | ICD-10-CM | POA: Diagnosis not present

## 2017-07-01 DIAGNOSIS — F419 Anxiety disorder, unspecified: Secondary | ICD-10-CM | POA: Diagnosis not present

## 2017-07-01 DIAGNOSIS — M8008XD Age-related osteoporosis with current pathological fracture, vertebra(e), subsequent encounter for fracture with routine healing: Secondary | ICD-10-CM | POA: Diagnosis not present

## 2017-07-01 DIAGNOSIS — I10 Essential (primary) hypertension: Secondary | ICD-10-CM | POA: Diagnosis not present

## 2017-07-01 DIAGNOSIS — R296 Repeated falls: Secondary | ICD-10-CM | POA: Diagnosis not present

## 2017-07-01 DIAGNOSIS — E1142 Type 2 diabetes mellitus with diabetic polyneuropathy: Secondary | ICD-10-CM | POA: Diagnosis not present

## 2017-07-05 DIAGNOSIS — H26493 Other secondary cataract, bilateral: Secondary | ICD-10-CM | POA: Diagnosis not present

## 2017-07-05 LAB — HM DIABETES EYE EXAM

## 2017-07-12 ENCOUNTER — Other Ambulatory Visit: Payer: Self-pay

## 2017-07-12 NOTE — Patient Outreach (Signed)
[place] [facility]) Care Management  07/12/2017  Peja Allender Hermida 26-Feb-1930 [id number]   Telephone Screen  Referral Date: 07/12/17 Referral Source: Self Referral Referral Reason: "has had [facility] services in the past and is in dire need of help with diabetes" Insurance: Medicare  Outreach attempt # 1 to patient. Spoke with patient and screening completed.   Social: Patient resides in her home alone. She states that she has paid private duty caregivers that assist her 28 hrs/week. She is fairly independent with ADLs/IADLs. She voices that she does not drive and relies on her caregivers to take her to appts. Patient reports multiple falls in the home, at least three since Christmas. She states that she "doesn't have a good balance." She voices she was getting HHPT but was recently discharged. She voices she has walker in the home.    Conditions: Per chart review, patient has PMH of Diabetes with neuropathy, osteoporosis, anemia, anxiety, right breast CA(2014), seizures, HLD, GERD, OA, HTN and skin cancer. Patient reports that she is checking blood sugars about once per day. She states that lately she has been having hypoglycemic issues at night/evenings. Patient voices she is symptomatic and normally drinks a coke to get her blood sugar back up. Per patient report her last A1C was 7.6. Patient states that she was "dismissed" by her endocrinologist (Dr. Loanne Drilling). She does not think it was fair or right. She reports she was dismissed because she showed up to office requesting MD sign paperwork for diabetic shoes. However, MD office notes state that patient was very upset and disruptive during encounter. She voices she was recommended to go to see Dr. Dellis Filbert Kerr(endocrinologist) but she called the office and he is unable to see new patients until Oct. RN CM discussed with patient asking PCP if she will cover/manage Diabetes until she finds another specialist. Patient very anxious and concerned about  finding an new MD and does not want PCP to manage condition.    Medications: She reports she is taking 10 meds. She gets her meds via [facility]. Patient concerned that she will soon run of her Humalog insulin since she has no MD to prescribe med. RN CM discussed with patient about alerting PCP to see if MD will write script for med until she gets established with endocrinologist but she did not seem satisfied with this response.    Appointments: She voices that she saw CP last 06/14/17. She reports that she is scheduled to have laser surgery on her right eye 07/14/17.   Advance Directives: Patient reports that she had a living will but "shredded it" after her spouse died. She voices voices spouse had one but she did not like how the medical team treated him because he had on. She is adamant and insistent that she will not complete another one. However, RN CM discussed patient at least considering selecting a surrogate decision maker. She states that she would like for her niece-Carolyn Jimmye Norman to be one of the designee along with her other nice. Patient interested in further info on this.    Consent: [facility] services reviewed and discussed. Patient gave verbal consent for services.   Plan: RN CM will notify [facility] administrative assistant of case status. RN CM will send [place] referral for polypharmacy med review and possible med mgmt. RN CM will send Celina RN referral for further in home eval/assessment of care needs in regards to mgmt of Diabetes and other chronic conditions.  Enzo Montgomery, RN,BSN,CCM [place]  Management Telephonic Care Management Coordinator Direct Phone: [phone] Toll Free: [phone] Fax: [phone]

## 2017-07-13 ENCOUNTER — Other Ambulatory Visit: Payer: Self-pay | Admitting: *Deleted

## 2017-07-13 NOTE — Patient Outreach (Signed)
[place] [facility]) Care Management  07/13/2017  Rand Boller Brandenburger 09/11/1929 [id number]    Referral received from telephonic care manager requesting home assessment and evaluation of needs regarding diabetes management.  Per chart, she also has history of hypertension, CAD, hyperlipidemia, and osteoarthritis.  Call placed to member, identity verified.  This care manager introduced self and purpose of call, [facility] care management services explained.  She denies any urgent concerns at this time, agrees to home visit next week.  Will perform initial assessment and individualized care plan at that time.  Valente David, [place], MSN [place] [phone]

## 2017-07-14 DIAGNOSIS — H26491 Other secondary cataract, right eye: Secondary | ICD-10-CM | POA: Diagnosis not present

## 2017-07-19 ENCOUNTER — Other Ambulatory Visit: Payer: Self-pay | Admitting: *Deleted

## 2017-07-19 ENCOUNTER — Encounter: Payer: Self-pay | Admitting: *Deleted

## 2017-07-19 NOTE — Patient Outreach (Signed)
[facility]) Care Management   07/19/2017  [name] 09-04-1929 [id number]  [name] is an 84 y.o. female  Subjective:   Member alert and oriented x3, denies pain or discomfort.  Report compliance with medications and daily checks of blood sugar.  Admits that she has started drinking more regular Coke lately, state "they just taste so good."  Report living alone, but has caregivers Monday-Friday between 4-6 hours daily.  Objective:   Review of Systems  Constitutional: Negative.   HENT: Negative.   Eyes: Negative.   Respiratory: Negative.   Cardiovascular: Negative.   Gastrointestinal: Negative.   Genitourinary: Negative.   Musculoskeletal: Negative.   Skin: Negative.     Physical Exam  Constitutional: She is oriented to person, place, and time. She appears well-developed and well-nourished.  Neck: Normal range of motion.  Cardiovascular: Normal rate, regular rhythm and normal heart sounds.  Respiratory: Effort normal and breath sounds normal.  GI: Soft. Bowel sounds are normal.  Musculoskeletal: Normal range of motion.  Neurological: She is alert and oriented to person, place, and time.  Skin: Skin is warm and dry.   BP (!) 142/70   Pulse 85   Resp 18   Ht 1.702 m ('5\' 7"' )   Wt 136 lb (61.7 kg)   SpO2 99%   BMI 21.30 kg/m   Encounter Medications:   Outpatient Encounter Medications as of 07/19/2017  Medication Sig Note  . ALPRAZolam (XANAX) 0.25 MG tablet Take 1 tablet (0.25 mg total) by mouth at bedtime. 03/02/2017: [facility]ed [facility] at [phone]. Received fax confirmation.   Marland Kitchen atorvastatin (LIPITOR) 20 MG tablet Take 1 tablet by mouth daily.   . Biotin 1000 MCG tablet Take 1,000 mcg by mouth daily.    . Blood Glucose Monitoring Suppl (PRODIGY VOICE BLOOD GLUCOSE) w/Device KIT Use to check blood sugar 1 time per day.   . calcitonin, salmon, (MIACALCIN/FORTICAL) 200 UNIT/ACT nasal spray Place 1 spray into alternate  nostrils daily.   . colestipol (COLESTID) 1 G tablet Take 1-2 g by mouth 3 (three) times daily. Take 2 tablets in the morning, 1 tablet at noon, and 2 tablets in the evening   . cyanocobalamin (,VITAMIN B-12,) 1000 MCG/ML injection INJECT 1 ML INTRAMUSCULARLY EVERY 21 DAYS   . DiphenhydrAMINE HCl (BENADRYL ALLERGY PO) Take 1 tablet by mouth as needed.   Marland Kitchen escitalopram (LEXAPRO) 10 MG tablet Take 10 mg by mouth daily.   . ferrous sulfate 325 (65 FE) MG tablet Take 325 mg by mouth every other day.   . furosemide (LASIX) 20 MG tablet Take 1 tablet (20 mg total) by mouth daily. Do not take until I call you about labs. (Patient taking differently: Take 20 mg by mouth as needed. Do not take until I call you about labs.) 07/19/2014: .  . glucose blood test strip Use to check blood sugar 1 time per day.   . insulin lispro (HUMALOG KWIKPEN) 100 UNIT/ML KiwkPen Inject 0.07 mLs (7 Units total) into the skin 3 (three) times daily with meals. And pen needles 1/day   . Insulin Pen Needle 32G X 4 MM MISC Used to inject insulin 3x daily   . levothyroxine (SYNTHROID, LEVOTHROID) 50 MCG tablet TAKE 1 TABLET DAILY BEFORE BREAKFAST   . lidocaine (LIDODERM) 5 % Place 1 patch onto the skin daily. Remove & Discard patch within 12 hours or as directed by MD (Patient taking differently: Place 2 patches onto the skin daily  as needed (pain). Remove & Discard patch within 12 hours or as directed by MD)   . OVER THE COUNTER MEDICATION Take 1 capsule by mouth daily. Recall Max memory supplement   . phenytoin (DILANTIN) 100 MG ER capsule TAKE 3 CAPSULES AT BEDTIME (Patient taking differently: TAKE 3 CAPSULES AT BEDTIME. 2 capsules at bedtime and 1 in the morning)   . PHENYTOIN INFATABS 50 MG tablet CHEW 1 TABLET DAILY   . Polyethyl Glycol-Propyl Glycol 0.4-0.3 % SOLN Place 2 drops into both eyes at bedtime.    . primidone (MYSOLINE) 50 MG tablet Take 2 tablets (100 mg total) by mouth daily.   . ranitidine (ZANTAC) 150 MG capsule  Take 150 mg by mouth daily as needed for heartburn.    . traMADol (ULTRAM) 50 MG tablet Take 50 mg by mouth every 6 (six) hours as needed.    . trandolapril-verapamil (TARKA) 2-240 MG tablet Take 1 tablet by mouth daily.   . Cholecalciferol 5000 units capsule Take 5,000 Units by mouth daily.   Marland Kitchen JARDIANCE 25 MG TABS tablet Take 1 tablet by mouth daily.    No facility-administered encounter medications on file as of 07/19/2017.     Functional Status:   In your present state of health, do you have any difficulty performing the following activities: 07/19/2017  Hearing? N  Vision? N  Difficulty concentrating or making decisions? N  Walking or climbing stairs? Y  Dressing or bathing? N  Doing errands, shopping? Y  Preparing Food and eating ? Y  Using the Toilet? N  In the past six months, have you accidently leaked urine? Y  Do you have problems with loss of bowel control? N  Managing your Medications? N  Managing your Finances? N  Housekeeping or managing your Housekeeping? Y  Some recent data might be hidden    Fall/Depression Screening:    Fall Risk  07/12/2017 07/01/2016 05/27/2016  Falls in the past year? Yes Yes Yes  Number falls in past yr: 2 or more 2 or more 2 or more  Injury with Fall? Yes Yes Yes  Comment - Broke foot, wrist, back -  Risk Factor Category  High Fall Risk High Fall Risk High Fall Risk  Risk for fall due to : History of fall(s);Impaired vision;Impaired balance/gait;Medication side effect;Impaired mobility History of fall(s);Impaired balance/gait;Impaired mobility;Medication side effect;Other (Comment) History of fall(s);Impaired mobility  Risk for fall due to: Comment - History of seizures, brain tumor -  Follow up Education provided;Falls prevention discussed;Falls evaluation completed Falls prevention discussed;Education provided Falls prevention discussed   PHQ 2/9 Scores 07/12/2017 07/01/2016 05/27/2016 05/13/2016 02/06/2013 11/19/2011  PHQ - 2 Score 0 0 0 0 0 0     Assessment:    Met with member at scheduled time.  Hoyle Sauer, caregiver, present during visit.  Member report she is doing well and only contacted [facility] because she needed help finding new endocrinologist and getting diabetic shoes.  Assessment complete, discussed recent falls.  She report she has completed physical therapy, falls are no longer a concern.  Continues to return conversation back to finding new MD and obtaining shoes.  She state she would like to establish with Dr. Buddy Duty, but he don't have any openings until October.  She contacted [facility] requesting assistance hoping that we could get her appointment sooner than October.  Call placed to Dr. Buddy Duty office to inquire steps for new patients.  She will have to have a year's worth of records related to diabetes sent to  office for review (fax number [phone]) , appointment will be made after review of records.  Member and caregiver report this has already been requested on 1/31.  They were told they would receive a call if appointment could be made prior to October.    Member was not satisfied with this and wanted [facility] to help expedite service.  She is made aware that quickness of appointment is up to the office and will be determined after review of records.  She is advised to consider other endocrinologists if she is unable to get appointment within the time frame she is searching for.  Her goal is to become active with new MD to manage diabetes as well as sign for shoes.  Advised to inquire about primary MD or podiatrist signing for them if she is unable to get in with endocrinologist soon.  She verbalizes understanding.  She denies any further concerns at this time.  Provided with [facility] calendar and contact information for this care manager.  Plan:   Will follow up with Dr. Cindra Eves office to confirm they have received information from Dr. Cordelia Pen office. Will follow up with member within the next 2 weeks regarding transition of records to Dr.  Cindra Eves office and to confirm appointment has been scheduled.  THN CM Care Plan Problem One     Most Recent Value  Care Plan Problem One  Risk for complications of diabetes as evidenced by no assigned endocrinologist  Role Documenting the Problem One  Care Management [place] for Problem One  Active  [facility] Long Term Goal   Member will have new endocrinologist assigned within the next 45 days  THN Long Term Goal Start Date  07/19/17  Interventions for Problem One Long Term Goal  Call placed to desired endocrinologist office to discuss scheduling new patient appointment.  Member and caregiver advised to steps (providing office with records) to have appointment scheduled  THN CM Short Term Goal #1   Member will report decrease in sugar intake (coke) over the next 4 weeks  THN CM Short Term Goal #1 Start Date  07/19/17  Interventions for Short Term Goal #1  Educated on importance of drinking diet or zero drinks in effort to manage blood sugars  THN CM Short Term Goal #2   Member will have plan for new diabetic shoes within the next 4 weeks  THN CM Short Term Goal #2 Start Date  07/19/17  Interventions for Short Term Goal #2  Advised member and caregiver to discuss options for primary MD and/or podiatrist to sign for diabetic shoes until she is active with new endocrinologist.     Valente David, RN, MSN [place] Manager [phone]

## 2017-07-20 ENCOUNTER — Other Ambulatory Visit: Payer: Self-pay

## 2017-07-20 NOTE — Patient Outreach (Addendum)
[place] [facility]) Care Management  07/20/2017  [name] Jan 03, 1930 [id number]  Successful outreach attempt to [name] for medication assistance for Humalog.  HIPAA identifiers verified.  84 year old female referred for medication assistance.  [place] services requested for Humalog.  PMHx includes, but not limited to, diabetes mellitus, HTN, CAD, Hypothyroidism,  Asthma, GERD, Hypercholesterolemia   Subjective: [name] reports joint pain in her shoulder and elbow, along with the cramping in the "vein of my leg".  She states that this pain is due to her atorvastatin that she started several months ago.  She verbalized that she has stopped taking the atorvastatin two days ago and that the pain is better.   Patient also reports that she no longer takes Jardiance for her diabetes because it caused a yeast infection and she felt it wasn't working.  Patient states that [name] was aware, but that he is no longer her doctor.   Patient reports that she does not have any problem affording her insulin or any of her medications.  She gets her prescriptions through [facility] mail order.  States her insulin costs $24/month for a box of her pens.    Medication Review: Medications Reviewed Today    Reviewed by [name], [facility] (Pharmacist) on 07/20/17 at 1109  Med List Status: <None>  Medication Order Taking? Sig Documenting Provider Last Dose Status Informant  ALPRAZolam (XANAX) 0.25 MG tablet [id number] Yes Take 1 tablet (0.25 mg total) by mouth at bedtime. Yolanda Ducking, MD Taking Active Self           Med Note [name], [place] L   Tue Mar 02, 2017 10:42 AM) F[facility]ed printed/signed [facility] at [phone]. Received fax confirmation.   atorvastatin (LIPITOR) 20 MG tablet [id number] No Take 1 tablet by mouth daily. [provider] Not Taking Active            Med Note ([name], Donalynn Furlong   Tue Jul 20, 2017 11:04 AM) Pt reported not taking since  07/18/17 due to pain in joints and leg pain.  Biotin 1000 MCG tablet [id number] Yes Take 1,000 mcg by mouth daily.  [provider] Taking Active   Blood Glucose Monitoring Suppl (PRODIGY VOICE BLOOD GLUCOSE) w/Device KIT [id number] Yes Use to check blood sugar 1 time per day. Yolanda Shin, MD Taking Active   Cholecalciferol 5000 units capsule [id number] Yes Take 10,000 Units by mouth daily.  [provider] Taking Active Self           Med Note ([name], Donalynn Furlong   Tue Jul 20, 2017 11:06 AM) Pt reports she was told to take 2 tablets daily b/c she had low Vit D.  colestipol (COLESTID) 1 G tablet [id number] Yes Take 1-2 g by mouth 3 (three) times daily. Take 2 tablets in the morning, 1 tablet at noon, and 1 tablet in the evening. (per pt report) [provider] Taking Active Self  cyanocobalamin (,VITAMIN B-12,) 1000 MCG/ML injection [id number] Yes INJECT 1 ML INTRAMUSCULARLY EVERY 21 DAYS Yolanda Koch, MD Taking Active   DiphenhydrAMINE HCl (BENADRYL ALLERGY PO) [id number] Yes Take 1 tablet by mouth as needed. [provider] Taking Active   escitalopram (LEXAPRO) 10 MG tablet [id number] Yes Take 10 mg by mouth daily. [provider] Taking Active   ferrous sulfate 325 (65 FE) MG tablet [id number] Yes Take 325 mg by mouth every other day. [provider] Taking Active   furosemide (  LASIX) 20 MG tablet [id number] Yes Take 1 tablet (20 mg total) by mouth daily. Do not take until I call you about labs.  Patient taking differently:  Take 20 mg by mouth as needed. Do not take until I call you about labs.   Yolanda Olp, MD Taking Active Self           Med Note [name], [name]   Thu Jul 19, 2014  8:44 AM) .  glucose blood test strip [id number] Yes Use to check blood sugar 1 time per day. Yolanda Shin, MD Taking Active   insulin lispro [facility]) 100 UNIT/ML Mayer Masker [id number] Yes Inject 0.07 mLs (7 Units total) into the skin 3 (three)  times daily with meals. And pen needles 1/day Yolanda Shin, MD Taking Active   Insulin Pen Needle 32G X 4 MM MISC [id number] Yes Used to inject insulin 3x daily Yolanda Shin, MD Taking Active   levothyroxine (SYNTHROID, LEVOTHROID) 50 MCG tablet [id number] Yes TAKE 1 TABLET DAILY BEFORE Yolanda Stack, MD Taking Active   lidocaine (LIDODERM) 5 % [id number] Yes Place 1 patch onto the skin daily. Remove & Discard patch within 12 hours or as directed by MD  Patient taking differently:  Place 2 patches onto the skin daily as needed (pain). Remove & Discard patch within 12 hours or as directed by MD   Yolanda Koch, MD Taking Active Self  phenytoin (DILANTIN) 100 MG ER capsule [id number] Yes TAKE 3 CAPSULES AT BEDTIME  Patient taking differently:  TAKE 3 CAPSULES AT BEDTIME. 2 capsules at bedtime and 1 in the morning   Yolanda Ducking, MD Taking Active   PHENYTOIN INFATABS 50 MG tablet [id number] Yes CHEW 1 TABLET DAILY  Patient taking differently:  CHEW 1 TABLET EVERY MORNING.   Yolanda Ducking, MD Taking Active Self  Polyethyl Glycol-Propyl Glycol 0.4-0.3 % SOLN [id number] Yes Place 2 drops into both eyes at bedtime.  [provider] Taking Active Self  primidone (MYSOLINE) 50 MG tablet [id number] Yes Take 2 tablets (100 mg total) by mouth daily. Yolanda Ducking, MD Taking Active   ranitidine (ZANTAC) 150 MG capsule [id number] Yes Take 150 mg by mouth daily as needed for heartburn.  [provider] Taking Active Self  traMADol (ULTRAM) 50 MG tablet [id number] Yes Take 50 mg by mouth every 6 (six) hours as needed.  [provider] Taking Active   trandolapril-verapamil (TARKA) 2-240 MG tablet [id number] Yes Take 1 tablet by mouth daily. [provider] Taking Active          Assessment:  Patient reports CBG of 204 this am and 244 yesterday evening.  HgA1C was 6.8% on 03/30/17  Plan:   I will route my note to PCP, [name], to make her aware  that patient is no longer taking Jardiance and is reporting elevated blood glucose levels.  Patient has also stopped atorvastatin for perceived side effects of joint pain and leg cramping.  Note patient's extensive allergy list.    [place] will close patient's case as she has no medication assistance needs.  I am happy to assist in the future as needs arise.  I will alert [facility] CMA of case closure.  Joetta Manners, PharmD [place] [phone]

## 2017-07-31 ENCOUNTER — Encounter: Payer: Self-pay | Admitting: *Deleted

## 2017-08-03 ENCOUNTER — Ambulatory Visit (INDEPENDENT_AMBULATORY_CARE_PROVIDER_SITE_OTHER): Payer: Medicare Other | Admitting: Podiatry

## 2017-08-03 ENCOUNTER — Encounter: Payer: Self-pay | Admitting: Podiatry

## 2017-08-03 DIAGNOSIS — M79674 Pain in right toe(s): Secondary | ICD-10-CM | POA: Diagnosis not present

## 2017-08-03 DIAGNOSIS — E1151 Type 2 diabetes mellitus with diabetic peripheral angiopathy without gangrene: Secondary | ICD-10-CM

## 2017-08-03 DIAGNOSIS — M79675 Pain in left toe(s): Secondary | ICD-10-CM | POA: Diagnosis not present

## 2017-08-03 DIAGNOSIS — Q828 Other specified congenital malformations of skin: Secondary | ICD-10-CM

## 2017-08-03 DIAGNOSIS — B351 Tinea unguium: Secondary | ICD-10-CM

## 2017-08-04 NOTE — Progress Notes (Signed)
Subjective: 84 y.o. returns the office today for painful, elongated, thickened toenails which she cannot trim herself. Denies any redness or drainage around the nails.  She also states that the hammertoes left second toe needs more cushion she is asking there is anything we can do to cushion this.  She is to think area but occasionally gets painful. Denies any acute changes since last appointment and no new complaints today. Denies any systemic complaints such as fevers, chills, nausea, vomiting.   Objective: AAO 3, NAD DP/PT pulses palpable but somewhat decreased, CRT less than 3 seconds Protective sensation decreased with Simms Weinstein monofilament Nails hypertrophic, dystrophic, elongated, brittle, discolored 10. There is tenderness overlying the nails 1-5 bilaterally. There is no surrounding erythema or drainage along the nail sites. Hammertoe contracture present bilaterally 1 cm left second toe with a small hyperkeratotic lesion dorsal PIPJ.  No underlying ulceration drainage or any signs of infection. Hyperkeratotic lesions bilateral submetatarsal 1.  Upon debridement no underlying ulceration drainage or any signs of infection. Prominent metatarsal heads plantarly with atrophy of the fat pad but hammertoe contractures present. No other areas of tenderness bilateral lower extremities. No overlying edema, erythema, increased warmth. No pain with calf compression, swelling, warmth, erythema.  Assessment: Patient presents with symptomatic onychomycosis; hyperkeratotic lesions  Plan: -Treatment options including alternatives, risks, complications were discussed -Nails sharply debrided 10 without complication/bleeding. -Hyperkeratotic pre-ulcerative lesions were sharply debrided today without any complications or bleeding x2 -Awaiting diabetic shoes dispensed offloading pads for the hammertoes. -Dispensed offloading pads for the hammer toes -Discussed daily foot inspection. If there are  any changes, to call the office immediately.  -Follow-up in 3 months or sooner if any problems are to arise. In the meantime, encouraged to call the office with any questions, concerns, changes symptoms.  Celesta Gentile, DPM

## 2017-08-05 ENCOUNTER — Other Ambulatory Visit: Payer: Self-pay | Admitting: *Deleted

## 2017-08-05 NOTE — Patient Outreach (Signed)
[place] [facility]) Care Management  08/05/2017  [name] Feb 04, 1930 [id number]   Call placed to member to follow up contact with new endocrinologist.  She report she has not been contacted to move her appointment date up, next available remains in October.  State she contacted primary MD office to have insulin refilled as she will run out today.  Report she was told that this would be the only refill and all others going forward will need to be done by endocrinologist.  State she has requested primary MD office to contact Dr. Cindra Eves office directly to see if appointment could be moved up.    This care manger placed call to primary MD office, spoke with [place] regarding member's concerns of needing MD to manage diabetes and insulin.  Request made to have office contact Dr. Buddy Duty for referral in effort to have member established before she will need another refill.  They will contact Dr. Buddy Duty.  Member denies any urgent concerns, will follow up within the next 2 weeks.  Valente David, [place], MSN [place] [phone]

## 2017-08-09 DIAGNOSIS — E559 Vitamin D deficiency, unspecified: Secondary | ICD-10-CM | POA: Diagnosis not present

## 2017-08-16 DIAGNOSIS — Z6822 Body mass index (BMI) 22.0-22.9, adult: Secondary | ICD-10-CM | POA: Diagnosis not present

## 2017-08-16 DIAGNOSIS — E559 Vitamin D deficiency, unspecified: Secondary | ICD-10-CM | POA: Diagnosis not present

## 2017-08-16 DIAGNOSIS — K219 Gastro-esophageal reflux disease without esophagitis: Secondary | ICD-10-CM | POA: Diagnosis not present

## 2017-08-16 DIAGNOSIS — E039 Hypothyroidism, unspecified: Secondary | ICD-10-CM | POA: Diagnosis not present

## 2017-08-16 DIAGNOSIS — F419 Anxiety disorder, unspecified: Secondary | ICD-10-CM | POA: Diagnosis not present

## 2017-08-16 DIAGNOSIS — R251 Tremor, unspecified: Secondary | ICD-10-CM | POA: Diagnosis not present

## 2017-08-16 DIAGNOSIS — R197 Diarrhea, unspecified: Secondary | ICD-10-CM | POA: Diagnosis not present

## 2017-08-17 DIAGNOSIS — M546 Pain in thoracic spine: Secondary | ICD-10-CM | POA: Diagnosis not present

## 2017-08-19 ENCOUNTER — Other Ambulatory Visit: Payer: Self-pay | Admitting: *Deleted

## 2017-08-19 NOTE — Patient Outreach (Signed)
[facility]) Care Management  08/19/2017  [name] 1929/08/11 [id number]   Call placed to member to follow up on appointment with endocrinologist.  No answer, HIPAA compliant voice message left.  Will await call back.  If no call back, will follow up within the next 2 weeks.  Valente David, [place], MSN [place] [phone]

## 2017-08-20 DIAGNOSIS — I10 Essential (primary) hypertension: Secondary | ICD-10-CM | POA: Diagnosis not present

## 2017-08-20 DIAGNOSIS — E039 Hypothyroidism, unspecified: Secondary | ICD-10-CM | POA: Diagnosis not present

## 2017-08-20 DIAGNOSIS — E11319 Type 2 diabetes mellitus with unspecified diabetic retinopathy without macular edema: Secondary | ICD-10-CM | POA: Diagnosis not present

## 2017-08-20 DIAGNOSIS — E119 Type 2 diabetes mellitus without complications: Secondary | ICD-10-CM | POA: Diagnosis not present

## 2017-08-20 DIAGNOSIS — E1140 Type 2 diabetes mellitus with diabetic neuropathy, unspecified: Secondary | ICD-10-CM | POA: Diagnosis not present

## 2017-08-20 DIAGNOSIS — E1121 Type 2 diabetes mellitus with diabetic nephropathy: Secondary | ICD-10-CM | POA: Diagnosis not present

## 2017-08-20 DIAGNOSIS — Z794 Long term (current) use of insulin: Secondary | ICD-10-CM | POA: Diagnosis not present

## 2017-08-20 DIAGNOSIS — L84 Corns and callosities: Secondary | ICD-10-CM | POA: Diagnosis not present

## 2017-08-20 DIAGNOSIS — M810 Age-related osteoporosis without current pathological fracture: Secondary | ICD-10-CM | POA: Diagnosis not present

## 2017-08-25 DIAGNOSIS — Z8603 Personal history of neoplasm of uncertain behavior: Secondary | ICD-10-CM | POA: Insufficient documentation

## 2017-08-25 DIAGNOSIS — M810 Age-related osteoporosis without current pathological fracture: Secondary | ICD-10-CM | POA: Insufficient documentation

## 2017-08-25 DIAGNOSIS — E041 Nontoxic single thyroid nodule: Secondary | ICD-10-CM | POA: Insufficient documentation

## 2017-08-25 DIAGNOSIS — Q782 Osteopetrosis: Secondary | ICD-10-CM | POA: Insufficient documentation

## 2017-08-25 DIAGNOSIS — M2040 Other hammer toe(s) (acquired), unspecified foot: Secondary | ICD-10-CM | POA: Insufficient documentation

## 2017-08-25 DIAGNOSIS — E039 Hypothyroidism, unspecified: Secondary | ICD-10-CM | POA: Insufficient documentation

## 2017-08-25 DIAGNOSIS — Z9889 Other specified postprocedural states: Secondary | ICD-10-CM | POA: Insufficient documentation

## 2017-08-25 DIAGNOSIS — Z853 Personal history of malignant neoplasm of breast: Secondary | ICD-10-CM | POA: Insufficient documentation

## 2017-08-30 ENCOUNTER — Telehealth: Payer: Self-pay | Admitting: Podiatry

## 2017-08-30 NOTE — Telephone Encounter (Signed)
Pt left voicemail on 4.26.19 stating she is now seeing Jacolyn Reedy (endocrinolgist) for her diabetes and the number to that office is [phone]. It is on [address]   I returned call to patient and left a message that Jacolyn Reedy is a NP and is not allowed by medicare to sign off on the shoes it has to be a MD/DO. If she has not seen a MD/Do in that practice she needs to make an appt to see one so they can sign for pt to get shoes. I asked pt to give me a call back and let me know.

## 2017-08-31 ENCOUNTER — Ambulatory Visit (INDEPENDENT_AMBULATORY_CARE_PROVIDER_SITE_OTHER): Payer: Medicare Other | Admitting: Adult Health

## 2017-08-31 ENCOUNTER — Telehealth: Payer: Self-pay | Admitting: *Deleted

## 2017-08-31 ENCOUNTER — Encounter: Payer: Self-pay | Admitting: Adult Health

## 2017-08-31 VITALS — BP 128/72 | HR 84 | Ht 67.0 in | Wt 140.2 lb

## 2017-08-31 DIAGNOSIS — G250 Essential tremor: Secondary | ICD-10-CM

## 2017-08-31 DIAGNOSIS — Z5181 Encounter for therapeutic drug level monitoring: Secondary | ICD-10-CM | POA: Diagnosis not present

## 2017-08-31 DIAGNOSIS — R569 Unspecified convulsions: Secondary | ICD-10-CM | POA: Diagnosis not present

## 2017-08-31 DIAGNOSIS — R413 Other amnesia: Secondary | ICD-10-CM | POA: Diagnosis not present

## 2017-08-31 NOTE — Progress Notes (Signed)
I have read the note, and I agree with the clinical assessment and plan.  Charles K [name]   

## 2017-08-31 NOTE — Progress Notes (Signed)
PATIENT: [name] DOB: 01-03-1930  REASON FOR VISIT: follow up HISTORY FROM: patient  HISTORY OF PRESENT ILLNESS: Today 08/31/17 [name] is an 84 year old female with a history of seizures and essential tremor.  She returns today for follow-up.  She reports that Mysoline has been beneficial for her tremor.  She still has good days and bad days.  She does state that taking Mysoline in the morning offered her more benefit than taking it at bedtime.  She denies any seizure events.  She lives at home alone but she has caregivers that come in approximately 28 hours a week to offer her assistance with ADLs and household chores.  They also take her to and from doctor's visits.  She states that she has 3-4 falls in December.  Her primary care sent her to physical therapy.  She states that they have suggested that she uses a cane or walker but she refuses.  She states that she has issues with word finding.  She can be in the middle of a conversation but cannot think of the word that she wants to use.  She typically does recall it later.  She also states that her granddaughter has made mention that she is forgetful.  She returns today for an evaluation.  HISTORY 03/02/17:  [name] is an 84 year old right-handed white female with a history of a seizure disorder associated with encephalomalacia of the right frontal area.  The patient has developed tremors that have affected both hands, she has a brother that also has similar tremor.  The patient has difficulty with handwriting, she would drop things because of the tremor.  She takes low-dose alprazolam at night for sleep.  She is on Dilantin for her seizures, the seizures have remained very well controlled.  The patient still operates a motor vehicle on occasion.  She returns for an evaluation.  A recent CT scan of the brain showed good stability from prior studies.   REVIEW OF SYSTEMS: Out of a complete 14 system review of symptoms, the patient complains  only of the following symptoms, and all other reviewed systems are negative.  See HPI  ALLERGIES: Allergies  Allergen Reactions  . Bystolic [Nebivolol Hcl] Other (See Comments)    "extreme weakness, heaviness in legs & arms, swelling in legs/arms/face, swollen abdomen, pain in bladder, feet pain, soreness in chest"  . Cholestyramine     "itching rash on stomach, bloated, nausea, vomiting, sleeplessness, extreme pain in arms"  . Hydrazine Yellow [Tartrazine] Other (See Comments)    "does not reduce high blood pressure, pain in arm, high pressure, felt like I was on verge of heart attack, really weak"  . Morphine Other (See Comments)    Feels morbid, weak, still in pain  . Niacin Palpitations    Fast heart beat  . Niaspan [Niacin Er] Palpitations and Other (See Comments)    "fast heart beat, high blood pressure"  . Norvasc [Amlodipine Besylate] Other (See Comments)    "extreme fluid retention/pain)  . Optivar [Azelastine Hcl] Photosensitivity  . Repaglinide Hives  . Sular [Nisoldipine Er] Other (See Comments)    "severe headaches, swelling eyes, hands, feet, shortness of breath, weak, flushed face, brain boiling, fluid retention, high blood sugar, nervous, heart fast beating"  . Tegretol [Carbamazepine] Other (See Comments)    Blood poisoning   . Telmisartan Other (See Comments)    "headache, difficulty urinating, high blood sugar, fluid retention"  . Cefdinir Swelling    Vaginal irritation,  breathing,   . Clonidine Other (See Comments)    Dry mouth, fluid retention  . Clonidine Hydrochloride Other (See Comments)    Dry mouth, fluid retention  . Codeine Nausea And Vomiting  . Ezetimibe Other (See Comments)    Made weak  . Naproxen Other (See Comments)    Shrinks bladder  . Ziac [Bisoprolol-Hydrochlorothiazide] Other (See Comments)    "stopped urination"  . Elavil [Amitriptyline] Other (See Comments)    Gave Pt nightmares  . Keppra [Levetiracetam] Other (See Comments)     Shaking  . Lamictal [Lamotrigine]     itching  . Lyrica [Pregabalin] Swelling  . Topamax [Topiramate]     Dry eyes  . Ace Inhibitors Other (See Comments)    unknown  . Actonel [Risedronate Sodium] Other (See Comments)    unknown  . Amlodipine Besylate Other (See Comments)    unknown  . Aspirin Other (See Comments)    unknown  . Atacand [Candesartan] Other (See Comments)    unknown  . Bextra [Valdecoxib] Other (See Comments)    unknown  . Bisoprolol-Hydrochlorothiazide Other (See Comments)    unknown  . Candesartan Cilexetil Other (See Comments)    unknown  . Cefadroxil Other (See Comments)    unknown  . Celecoxib Rash  . Hydrocodone Other (See Comments)    unknown  . Hydrocodone-Acetaminophen Other (See Comments)    unknown  . Iodinated Diagnostic Agents Rash    "All over" "All over"  . Meloxicam Other (See Comments)    unknown  . Methylprednisolone Sodium Succinate Other (See Comments)    unknown  . Nabumetone Other (See Comments)    unknown  . Penicillins Other (See Comments)    unknown  . Pseudoephedrine-Guaifenesin Other (See Comments)    unknown  . Risedronate Sodium Other (See Comments)    unknown  . Rofecoxib Other (See Comments)    unknown  . Ru-Tuss [Chlorphen-Pse-Atrop-Hyos-Scop] Other (See Comments)    unknown  . Sulfonamide Derivatives Other (See Comments)    unknown  . Sulphur [Sulfur] Other (See Comments)    unknown  . Telithromycin Other (See Comments)    unknown  . Terfenadine Other (See Comments)    unknown  . Trandolapril-Verapamil Hcl Er Other (See Comments)    Headache, difficulty urinating, high blood sugar, fluid retention  Pt is taking Tarka (trandolapril-verapamil) currently, but requests the medication stay in her allergy list  . Trandolapril-Verapamil Hcl Er Other (See Comments)    Headache, difficulty urinating, high blood sugar, fluid retention  Pt is taking Tarka (trandolapril-verapamil) currently, but requests the medication  stay in her allergy list  . Valium [Diazepam] Other (See Comments)    Makes her mean and hyper    HOME MEDICATIONS: Outpatient Medications Prior to Visit  Medication Sig Dispense Refill  . ALPRAZolam (XANAX) 0.25 MG tablet Take 1 tablet (0.25 mg total) by mouth at bedtime. 90 tablet 1  . atorvastatin (LIPITOR) 20 MG tablet Take 1 tablet by mouth daily.    . Biotin 1000 MCG tablet Take 1,000 mcg by mouth daily.     . Blood Glucose Monitoring Suppl (PRODIGY VOICE BLOOD GLUCOSE) w/Device KIT Use to check blood sugar 1 time per day. 1 each 2  . Cholecalciferol 5000 units capsule Take 10,000 Units by mouth daily.     . colestipol (COLESTID) 1 G tablet Take 1-2 g by mouth 3 (three) times daily. Take 2 tablets in the morning, 1 tablet at noon, and 1 tablet in the evening. (  per pt report)    . cyanocobalamin (,VITAMIN B-12,) 1000 MCG/ML injection INJECT 1 ML INTRAMUSCULARLY EVERY 21 DAYS 3 mL 2  . DiphenhydrAMINE HCl (BENADRYL ALLERGY PO) Take 1 tablet by mouth as needed.    Marland Kitchen escitalopram (LEXAPRO) 10 MG tablet Take 10 mg by mouth daily.    . ferrous sulfate 325 (65 FE) MG tablet Take 325 mg by mouth every other day.    . furosemide (LASIX) 20 MG tablet Take 1 tablet (20 mg total) by mouth daily. Do not take until I call you about labs. (Patient taking differently: Take 20 mg by mouth as needed. Do not take until I call you about labs.) 30 tablet 0  . glucose blood test strip Use to check blood sugar 1 time per day. 100 each 3  . insulin lispro (HUMALOG KWIKPEN) 100 UNIT/ML KiwkPen Inject 0.07 mLs (7 Units total) into the skin 3 (three) times daily with meals. And pen needles 1/day 15 pen 3  . Insulin Pen Needle 32G X 4 MM MISC Used to inject insulin 3x daily 270 each 2  . levothyroxine (SYNTHROID, LEVOTHROID) 50 MCG tablet TAKE 1 TABLET DAILY BEFORE BREAKFAST 90 tablet 1  . lidocaine (LIDODERM) 5 % Place 1 patch onto the skin daily. Remove & Discard patch within 12 hours or as directed by MD  (Patient taking differently: Place 2 patches onto the skin daily as needed (pain). Remove & Discard patch within 12 hours or as directed by MD) 90 patch 1  . phenytoin (DILANTIN) 100 MG ER capsule TAKE 3 CAPSULES AT BEDTIME (Patient taking differently: TAKE 3 CAPSULES AT BEDTIME. 2 capsules at bedtime and 1 in the morning) 270 capsule 3  . PHENYTOIN INFATABS 50 MG tablet CHEW 1 TABLET DAILY (Patient taking differently: CHEW 1 TABLET EVERY MORNING.) 90 tablet 3  . Polyethyl Glycol-Propyl Glycol 0.4-0.3 % SOLN Place 2 drops into both eyes at bedtime.     . primidone (MYSOLINE) 50 MG tablet Take 2 tablets (100 mg total) by mouth daily. 180 tablet 1  . ranitidine (ZANTAC) 150 MG capsule Take 150 mg by mouth daily as needed for heartburn.     . traMADol (ULTRAM) 50 MG tablet Take 50 mg by mouth every 6 (six) hours as needed.     . trandolapril-verapamil (TARKA) 2-240 MG tablet Take 1 tablet by mouth daily.     No facility-administered medications prior to visit.     PAST MEDICAL HISTORY: Past Medical History:  Diagnosis Date  . Anemia   . Anxiety   . Breast cancer ([place]) 2014   right breast  . Cancer of right breast ([place]) 12/26/12   right breast 12:00 o'clock, DCIS  . Chronic cough   . Chronic facial pain   . Chronic foot pain   . Complication of anesthesia    Sore jaw; could not chew or move mouth  . Convulsions/seizures ([place]) 10/16/2014  . Diabetes mellitus    type 2 niddm x 20 years  . Dyslipidemia   . Ejection fraction   . GERD (gastroesophageal reflux disease)   . Hammer toe    bilateral  . History of colonic polyps   . HTN (hypertension)   . Hx of radiation therapy 03/07/13- 03/29/13   right breast 4250 cGy 17 sessions  . Hyperlipidemia   . Melanoma ([place])   . Metatarsal bone fracture 2014  . Obesity   . Osteoarthritis   . Palpitations   . Personal history of radiation therapy 2014  .  Seasonal allergies   . Skin cancer   . Tremor, essential 08/18/2016  . Vitamin B12  deficiency     PAST SURGICAL HISTORY: Past Surgical History:  Procedure Laterality Date  . ABDOMINAL HYSTERECTOMY    . BRAIN SURGERY    . BREAST BIOPSY Right 01/24/2013   Procedure: RE-EXCICION OF BREAST CANCER, ANTERIOR MARGINS;  Surgeon: Edward Jolly, MD;  Location: [facility];  Service: General;  Laterality: Right;  . BREAST LUMPECTOMY Right 2014  . BREAST LUMPECTOMY WITH NEEDLE LOCALIZATION Right 01/17/2013   Procedure: BREAST LUMPECTOMY WITH NEEDLE LOCALIZATION;  Surgeon: Edward Jolly, MD;  Location: [facility];  Service: General;  Laterality: Right;  . BUNIONECTOMY    . CATARACT EXTRACTION W/ INTRAOCULAR LENS IMPLANT Right   . CHOLECYSTECTOMY    . COLONOSCOPY    . CRANIOTOMY Right 10/18/2013   Procedure: CRANIOTOMY TUMOR EXCISION;  Surgeon: Floyce Stakes, MD;  Location: [facility];  Service: Neurosurgery;  Laterality: Right;  . EYE SURGERY    . HERNIA REPAIR    . KNEE ARTHROSCOPY Bilateral   . POLYPECTOMY     small adenomatous  . ULNAR TUNNEL RELEASE      FAMILY HISTORY: Family History  Problem Relation Age of Onset  . Heart disease Mother   . Osteoporosis Mother   . Diabetes Father   . Pancreatic cancer Father   . Colon cancer Unknown   . Bone cancer Sister   . Prostate cancer Brother   . Colon cancer Brother   . Rectal cancer Sister   . Thyroid disease Sister        benign goiter resected    SOCIAL HISTORY: Social History   Socioeconomic History  . Marital status: Widowed    Spouse name: Not on file  . Number of children: 0  . Years of education: college  . Highest education level: Not on file  Occupational History  . Occupation: retired  Scientific laboratory technician  . Financial resource strain: Not on file  . Food insecurity:    Worry: Not on file    Inability: Not on file  . Transportation needs:    Medical: Not on file    Non-medical: Not on file  Tobacco Use  . Smoking status: Never Smoker  . Smokeless tobacco: Never Used  Substance and Sexual  Activity  . Alcohol use: No  . Drug use: No  . Sexual activity: Yes    Comment: menarche age 16, hysterectomy age 66, HRT x 2-3 mos, G1- miscarriage  Lifestyle  . Physical activity:    Days per week: Not on file    Minutes per session: Not on file  . Stress: Not on file  Relationships  . Social connections:    Talks on phone: Not on file    Gets together: Not on file    Attends religious service: Not on file    Active member of club or organization: Not on file    Attends meetings of clubs or organizations: Not on file    Relationship status: Not on file  . Intimate partner violence:    Fear of current or ex partner: Not on file    Emotionally abused: Not on file    Physically abused: Not on file    Forced sexual activity: Not on file  Other Topics Concern  . Not on file  Social History Narrative   Widowed, lives alone.  Ambulates independently.    Drinks decaf only   Patient is right  handed.      PHYSICAL EXAM  Vitals:   08/31/17 1016  BP: 128/72  Pulse: 84  Weight: 140 lb 3.2 oz (63.6 kg)  Height: '5\' 7"'  (1.702 m)   Body mass index is 21.96 kg/m.   MMSE - Mini Mental State Exam 08/31/2017  Orientation to time 5  Orientation to Place 5  Registration 3  Attention/ Calculation 4  Recall 2  Language- name 2 objects 2  Language- repeat 1  Language- follow 3 step command 3  Language- read & follow direction 1  Write a sentence 1  Copy design 0  Total score 27      Generalized: Well developed, in no acute distress   Neurological examination  Mentation: Alert oriented to time, place, history taking. Follows all commands speech and language fluent Cranial nerve II-XII: Pupils were equal round reactive to light. Extraocular movements were full, visual field were full on confrontational test. Facial sensation and strength were normal. Uvula tongue midline. Head turning and shoulder shrug  were normal and symmetric. Motor: The motor testing reveals 5 over 5  strength of all 4 extremities. Good symmetric motor tone is noted throughout.  Sensory: Sensory testing is intact to soft touch on all 4 extremities. No evidence of extinction is noted.  Coordination: Cerebellar testing reveals good finger-nose-finger and heel-to-shin bilaterally.  Gait and station: Gait is normal. Tandem gait is normal. Romberg is negative. No drift is seen.  Reflexes: Deep tendon reflexes are symmetric and normal bilaterally.   DIAGNOSTIC DATA (LABS, IMAGING, TESTING) - I reviewed patient records, labs, notes, testing and imaging myself where available.  Lab Results  Component Value Date   WBC 7.7 02/17/2016   HGB 11.5 02/17/2016   HCT 35.1 02/17/2016   MCV 95 02/17/2016   PLT 294 02/17/2016      Component Value Date/Time   NA 137 11/20/2016 0900   NA 136 02/17/2016 1148   NA 138 01/04/2013 1212   K 4.2 11/20/2016 0900   K 3.9 01/04/2013 1212   CL 103 11/20/2016 0900   CO2 24 11/20/2016 0900   CO2 22 01/04/2013 1212   GLUCOSE 323 (H) 11/20/2016 0900   GLUCOSE 285 (H) 01/04/2013 1212   BUN 16 11/20/2016 0900   BUN 13 02/17/2016 1148   BUN 10.3 01/04/2013 1212   CREATININE 1.08 11/20/2016 0900   CREATININE 0.78 12/01/2013 1659   CREATININE 1.0 01/04/2013 1212   CALCIUM 8.8 11/20/2016 0900   CALCIUM 9.6 01/04/2013 1212   PROT 6.0 02/17/2016 1148   PROT 6.4 01/04/2013 1212   ALBUMIN 4.0 02/17/2016 1148   ALBUMIN 3.6 01/04/2013 1212   AST 21 02/17/2016 1148   AST 14 01/04/2013 1212   ALT 30 02/17/2016 1148   ALT 16 01/04/2013 1212   ALKPHOS 97 02/17/2016 1148   ALKPHOS 52 01/04/2013 1212   BILITOT <0.2 02/17/2016 1148   BILITOT 1.01 01/04/2013 1212   GFRNONAA 75 02/17/2016 1148   GFRAA 87 02/17/2016 1148   Lab Results  Component Value Date   CHOL 148 01/30/2013   HDL 47.10 01/30/2013   LDLCALC 65 01/30/2013   LDLDIRECT 85.7 09/05/2009   TRIG 179.0 (H) 01/30/2013   CHOLHDL 3 01/30/2013   Lab Results  Component Value Date   HGBA1C 6.8  03/30/2017   Lab Results  Component Value Date   VITAMINB12 501 09/05/2009   Lab Results  Component Value Date   TSH 1.60 03/02/2016      ASSESSMENT AND PLAN 84  y.o. year old female  has a past medical history of Anemia, Anxiety, Breast cancer ([place]) (2014), Cancer of right breast ([place]) (12/26/12), Chronic cough, Chronic facial pain, Chronic foot pain, Complication of anesthesia, Convulsions/seizures ([place]) (10/16/2014), Diabetes mellitus, Dyslipidemia, Ejection fraction, GERD (gastroesophageal reflux disease), Hammer toe, History of colonic polyps, HTN (hypertension), radiation therapy (03/07/13- 03/29/13), Hyperlipidemia, Melanoma ([place]), Metatarsal bone fracture (2014), Obesity, Osteoarthritis, Palpitations, Personal history of radiation therapy (2014), Seasonal allergies, Skin cancer, Tremor, essential (08/18/2016), and Vitamin B12 deficiency. here with:   1.Seizures 2.  Essential tremor 3.  Memory disturbance  The patient will continue on Dilantin for seizures.  I will check blood work today.  She will continue taking Mysoline 100 mg daily for tremor.  Her memory score today was 27 out of 30.  We will continue to monitor the memory at this time.  She is advised that if her symptoms worsen or she develops new symptoms she should let us know.  She will follow-up in 6 months or sooner if needed.      Ward Givens, MSN, NP-C 08/31/2017, 10:25 AM [facility] [address], [place], [place] [address] [phone]

## 2017-08-31 NOTE — Telephone Encounter (Signed)
Before patient left office today she requested a copy of 04/20/17 letter from Dr Jannifer Franklin. She received a copy and stated it was incorrect. She stated she has never received only 16 hrs/week of assistance. She has been paying out of pocket and receiving 28 hrs/week because her insurance is only paying for 16 hours. She stated it is because of what the letter states. This RN advised her that Dr Jannifer Franklin clearly stated at end of letter she needs 28 hrs/week, and he would have gotten the 16 hrs/week information from her. She again stated she has never gotten only 16 hours. This RN advised her that if she wants insurance to pay for 28 hrs she needs to call them and find out exactly what they require. Then she can bring that information or paperwork to this office for assistance. Advised her that would not guarantee her insurance will cover, but we will assist her as best we are able.  She verbalized understanding, stated she would contact her insurance.

## 2017-08-31 NOTE — Patient Instructions (Signed)
Your Plan:  Continue Mysoline and dilantin Check blood work today Will monitor memory symptoms If your symptoms worsen or you develop new symptoms please let us know.    Thank you for coming to see [place] at [facility]. I hope we have been able to provide you high quality care today.  You Klar receive a patient satisfaction survey over the next few weeks. We would appreciate your feedback and comments so that we Bean continue to improve ourselves and the health of our patients.

## 2017-09-01 LAB — CBC WITH DIFFERENTIAL/PLATELET
Basophils Absolute: 0 10*3/uL (ref 0.0–0.2)
Basos: 0 %
EOS (ABSOLUTE): 0.1 10*3/uL (ref 0.0–0.4)
Eos: 1 %
Hematocrit: 36.2 % (ref 34.0–46.6)
Hemoglobin: 11.7 g/dL (ref 11.1–15.9)
Immature Grans (Abs): 0 10*3/uL (ref 0.0–0.1)
Immature Granulocytes: 0 %
Lymphocytes Absolute: 1.6 10*3/uL (ref 0.7–3.1)
Lymphs: 20 %
MCH: 31.6 pg (ref 26.6–33.0)
MCHC: 32.3 g/dL (ref 31.5–35.7)
MCV: 98 fL — ABNORMAL HIGH (ref 79–97)
Monocytes Absolute: 0.6 10*3/uL (ref 0.1–0.9)
Monocytes: 7 %
Neutrophils Absolute: 5.9 10*3/uL (ref 1.4–7.0)
Neutrophils: 72 %
Platelets: 243 10*3/uL (ref 150–379)
RBC: 3.7 x10E6/uL — ABNORMAL LOW (ref 3.77–5.28)
RDW: 14.3 % (ref 12.3–15.4)
WBC: 8.1 10*3/uL (ref 3.4–10.8)

## 2017-09-01 LAB — COMPREHENSIVE METABOLIC PANEL
ALT: 28 IU/L (ref 0–32)
AST: 26 IU/L (ref 0–40)
Albumin/Globulin Ratio: 2.4 — ABNORMAL HIGH (ref 1.2–2.2)
Albumin: 4.1 g/dL (ref 3.5–4.7)
Alkaline Phosphatase: 86 IU/L (ref 39–117)
BUN/Creatinine Ratio: 13 (ref 12–28)
BUN: 11 mg/dL (ref 8–27)
Bilirubin Total: 0.2 mg/dL (ref 0.0–1.2)
CO2: 20 mmol/L (ref 20–29)
Calcium: 9.1 mg/dL (ref 8.7–10.3)
Chloride: 104 mmol/L (ref 96–106)
Creatinine, Ser: 0.84 mg/dL (ref 0.57–1.00)
GFR calc Af Amer: 73 mL/min/{1.73_m2} (ref >59)
GFR calc non Af Amer: 64 mL/min/{1.73_m2} (ref >59)
Globulin, Total: 1.7 g/dL (ref 1.5–4.5)
Glucose: 223 mg/dL — ABNORMAL HIGH (ref 65–99)
Potassium: 4.3 mmol/L (ref 3.5–5.2)
Sodium: 138 mmol/L (ref 134–144)
Total Protein: 5.8 g/dL — ABNORMAL LOW (ref 6.0–8.5)

## 2017-09-01 LAB — PRIMIDONE, SERUM
Phenobarbital, Serum: 4 ug/mL — ABNORMAL LOW (ref 15–40)
Primidone Lvl: 2.9 ug/mL — ABNORMAL LOW (ref 5.0–12.0)

## 2017-09-01 LAB — PHENYTOIN LEVEL, TOTAL: Phenytoin (Dilantin), Serum: 4.6 ug/mL — ABNORMAL LOW (ref 10.0–20.0)

## 2017-09-01 LAB — PHENOBARBITAL LEVEL: Phenobarbital, Serum: 4 ug/mL — ABNORMAL LOW (ref 15–40)

## 2017-09-02 ENCOUNTER — Other Ambulatory Visit: Payer: Self-pay | Admitting: *Deleted

## 2017-09-02 NOTE — Patient Outreach (Signed)
[facility]) Care Management  09/02/2017  [name] [birth year] [id number]   Call placed to member to follow up on current health conditions and contact with endocrinologist.  She report she is doing well, is now active with endocrinologist affiliated with [facility] (office located in [place]).  She state her insulin has been renewed and she has plan for new diabetic shoes.  She denies any other needs at this time.  She is open to ongoing follow up telephonically from health coach.  Will notify primary MD of discipline closure, will place referral to health coach.  [facility] CM Care Plan Problem One     Most Recent Value  Care Plan Problem One  Risk for complications of diabetes as evidenced by no assigned endocrinologist  Role Documenting the Problem One  Care Management [place] for Problem One  Not Active  [facility] Long Term Goal   Member will have new endocrinologist assigned within the next 45 days  [facility] Long Term Goal Start Date  07/19/17  [facility] Long Term Goal Met Date  09/02/17  [facility] CM Short Term Goal #1   Member will report decrease in sugar intake (coke) over the next 4 weeks  [facility] CM Short Term Goal #1 Start Date  07/19/17  [facility] CM Short Term Goal #1 Met Date  09/02/17  [facility] CM Short Term Goal #2   Member will have plan for new diabetic shoes within the next 4 weeks  [facility] CM Short Term Goal #2 Start Date  07/19/17  [facility] CM Short Term Goal #2 Met Date  09/02/17     Valente David, RN, MSN [place] [phone]

## 2017-09-06 ENCOUNTER — Other Ambulatory Visit: Payer: Self-pay | Admitting: *Deleted

## 2017-09-06 ENCOUNTER — Telehealth: Payer: Self-pay | Admitting: *Deleted

## 2017-09-06 MED ORDER — ALPRAZOLAM 0.25 MG PO TABS: 0.2500 mg | ORAL_TABLET | Freq: Every day | ORAL | 1 refills | Status: DC

## 2017-09-06 NOTE — Telephone Encounter (Signed)
-----   Message from Kathrynn Ducking, MD sent at 09/03/2017 12:58 PM EDT ----- Blood work reveals an elevated blood sugar, slightly low total protein.  CBC is relatively unremarkable.  Primidone and phenobarbital levels are low, Dilantin level is low, no change in dosing, patient is well controlled with seizures.  Please call the patient. ----- Message ----- From: Brandon Melnick, RN Sent: 09/03/2017  12:49 PM To: Kathrynn Ducking, MD    ----- Message ----- From: Interface, [facility] Lab Results In Sent: 09/01/2017   7:40 AM To: Ward Givens, NP

## 2017-09-06 NOTE — Telephone Encounter (Signed)
Called and spoke w/ patient. Relayed results per CW,MD note. She verbalized understanding.   She saw MM,NP on 08/31/17. She relayed that on 08/30/17 she woke up and she has incontinence of her bowel in the middle of the night. Denies incontinence of bladder. She believes this could have been a seizure that caused this.  She did not mention this to MM,NP when she saw her on 08/31/17. Advised I will relay to Dr. Jannifer Franklin to make sure he does not want to make any adjustments to medications. Otherwise, advised her to call back if she has any new or worsening symptoms.

## 2017-09-06 NOTE — Telephone Encounter (Signed)
Dr. Jannifer Franklin- ok to refill rx xanax?  Received fax request from [facility] to send refill for rx xanax 0.25mg  tablet qty 90.   I checked [place] drug registry and she last filled rx 06/10/17 qty 35.  She is also receiving rx tramadol 50mg  tablet from Monango Desai,MD/[facility]. Last received rx 06/08/17 qty 180.

## 2017-09-06 NOTE — Telephone Encounter (Signed)
I called the patient.  The patient reports that she had a bout of diarrhea in her sleep.  She did not have a normal bowel movement.  She did not have urinary incontinence, she did not bite her tongue, she did not recall any muscle soreness the next day.  I am not sure at all that this represented a seizure, if this recurs, the patient will let me know.  She does report episodes of diarrhea frequently.  For now, I would not change any of her medications.

## 2017-09-07 NOTE — Telephone Encounter (Signed)
Faxed printed/signed rx to [facility] at [phone]. Received fax confirmation.

## 2017-09-08 ENCOUNTER — Encounter: Payer: Self-pay | Admitting: *Deleted

## 2017-09-14 ENCOUNTER — Ambulatory Visit: Payer: Medicare Other | Admitting: Podiatry

## 2017-09-20 ENCOUNTER — Other Ambulatory Visit: Payer: Self-pay | Admitting: *Deleted

## 2017-09-20 ENCOUNTER — Ambulatory Visit: Payer: Medicare Other | Admitting: Podiatry

## 2017-09-20 NOTE — Patient Outreach (Signed)
[facility]) Care Management  09/20/2017  [name] 06/03/1929 [id number]   RN Health Coach Initial Assessment  Referral Date:  09/06/2017 Referral Source:  Transfer from [place] Reason for Referral:  Continued Disease Management Education Insurance:  Medicare   Outreach Attempt:  Successful telephone outreach to patient for introductory call.  HIPAA verified with patient.  RN Health Coach introduced self and role.  Patient verbally agrees to monthly telephone outreaches  Plan:  [place] will make outreach to complete initial telephone assessment within the next 10 business days.   [place] [phone] [name]

## 2017-09-23 ENCOUNTER — Ambulatory Visit: Payer: Medicare Other | Admitting: *Deleted

## 2017-09-24 DIAGNOSIS — R197 Diarrhea, unspecified: Secondary | ICD-10-CM | POA: Diagnosis not present

## 2017-09-28 ENCOUNTER — Other Ambulatory Visit: Payer: Self-pay | Admitting: *Deleted

## 2017-09-28 DIAGNOSIS — R197 Diarrhea, unspecified: Secondary | ICD-10-CM | POA: Diagnosis not present

## 2017-09-28 NOTE — Patient Outreach (Signed)
[place]) Care Management  09/28/2017  Christabelle Hanzlik Virgen [birth year] [id number]   RN Health Coach Initial Assessment  Referral Date:  09/06/2017 Referral Source:  Transfer from [place] Reason for Referral:  Continued Disease Management Education Insurance:  Medicare   Outreach Attempt:   Outreach attempt #1 to patient for initial telephone assessment. No answer. RN Health Coach left HIPAA compliant voicemail message along with contact information.  Plan:  RN Health Coach will send unsuccessful outreach letter to patient.  RN Health Coach will make another outreach attempt to patient within 3-4 business days if no return call back from patient.   [place] [phone] [email or web address]

## 2017-09-29 ENCOUNTER — Other Ambulatory Visit: Payer: Self-pay | Admitting: *Deleted

## 2017-09-29 NOTE — Patient Outreach (Signed)
[place] [facility]) Care Management  09/29/2017  [name] 20-Jan-1930 [id number]   RN Health Coach Initial Assessment  Referral Date:09/06/2017 Referral Source:Transfer from [place] Nurse Reason for Referral:Continued Disease Management Education Insurance:Medicare   Outreach Attempt:  Received incoming telephone call from patient.  HIPAA verified with patient. Patient returning call from message left yesterday.  Discussed timing of initial telephone assessment with patient.  Patient continues to verbally agree to monthly telephone outreaches.  Plan:  RN Health Coach will make another attempt to complete initial telephone assessment within the next 5 business days.  [name] [phone] [name]

## 2017-09-30 ENCOUNTER — Other Ambulatory Visit: Payer: Self-pay | Admitting: Neurology

## 2017-10-01 ENCOUNTER — Ambulatory Visit: Payer: Self-pay | Admitting: *Deleted

## 2017-10-04 DIAGNOSIS — M546 Pain in thoracic spine: Secondary | ICD-10-CM | POA: Diagnosis not present

## 2017-10-04 DIAGNOSIS — M545 Low back pain: Secondary | ICD-10-CM | POA: Diagnosis not present

## 2017-10-07 ENCOUNTER — Encounter: Payer: Self-pay | Admitting: *Deleted

## 2017-10-07 ENCOUNTER — Other Ambulatory Visit: Payer: Self-pay | Admitting: *Deleted

## 2017-10-07 NOTE — Patient Outreach (Signed)
[facility]) Care Management  [place]  10/08/2017   [name] [birth year] [id number]   [place] Initial Assessment   Referral Date:  09/06/2017 Referral Source:  Transfer from [place] Reason for Referral:  Continued Disease Management Education Insurance:  Medicare   Outreach Attempt:  Successful telephone outreach to patient for initial telephone assessment.  HIPAA verified with patient.  Patient completed initial telephone assessment.  Social:  Patient lives at home alone.  States she is independent with ADLs and requires assistance with IADLs.  Patient verbalizes she receives assistance at home with private pay aides 28 hours a week, Monday-Friday.  Ambulates independently but reports about 6 falls in the past year, last fall last week outside.  Reports injuring her arm and back during one fall last Easter.  Patient stating she has more difficulties ambulating using a walker and has falling with trying to use a walker.  Does have Life Alert, for which she had to use during fall outside last week to receive assistance getting up.  Falls precautions and preventions reviewed with patient.  Patient reports her private pay caregivers assist transporting her to her medical appointments.  DME in the home include:  CBG meter, grab bars in the shower and around the toilet, built in shower sear, bedside commode, blood pressure cuff, scale, and rolling walker.  Conditions:  Per chart review and discussion with patient, PMH include but not limited to:  Diabetes, GERD, osteoporosis, breast cancer, brain tumor with excision in 2015, seizures, hypertension, hypothyroidism and hyperlipidemia.  Reports no recent emergency room visits or hospitalizations.  States she monitors her blood sugars about once a day at various times a day per the recommendations of her previous Endocrinologist.  Has not taken her blood sugar yet today.  Recent Hgb A1C was 7.2 with goal of  7.  Patient does report being recently diagnosed with CDIFF and currently taking vancomycin by mouth.  Reports stools are less watery and less frequent.  States she is still having about 4 bowel movements a day.  Patient encouraged to stay hydrated.  Medications:  Patient unable to verbalize how many medications she takes.  Reviewed medication list with patient and patient takes about 21 medications.  Manages her medications herself with weekly pill box fills. States her caregivers will give her reminders to take her medications at times.  Self administers insulin injections.  Denies any difficulties affording medications.   Encounter Medications:  Outpatient Encounter Medications as of 10/07/2017  Medication Sig Note  . ALPRAZolam (XANAX) 0.25 MG tablet Take 1 tablet (0.25 mg total) by mouth at bedtime. 09/07/2017: [facility] at [phone]. Received fax confirmation.    Marland Kitchen atorvastatin (LIPITOR) 20 MG tablet Take 1 tablet by mouth daily. 10/07/2017: Patient stated she is taking medication now  . Biotin 1000 MCG tablet Take 1,000 mcg by mouth daily.    . Blood Glucose Monitoring Suppl (PRODIGY VOICE BLOOD GLUCOSE) w/Device KIT Use to check blood sugar 1 time per day.   . Cholecalciferol 5000 units capsule Take 10,000 Units by mouth daily.  07/20/2017: Pt reports she was told to take 2 tablets daily b/c she had low Vit D.  . colestipol (COLESTID) 1 G tablet Take 1-2 g by mouth 3 (three) times daily. Take 2 tablets in the morning, 1 tablet at noon, and 1 tablet in the evening. (per pt report) 10/07/2017: Patient reports taking 2 tablets in morning and 2  tablets at night  . cyanocobalamin (,VITAMIN B-12,) 1000 MCG/ML injection INJECT 1 ML INTRAMUSCULARLY EVERY 21 DAYS   . DiphenhydrAMINE HCl (BENADRYL ALLERGY PO) Take 1 tablet by mouth as needed.   Marland Kitchen escitalopram (LEXAPRO) 10 MG tablet Take 10 mg by mouth daily. 10/07/2017: Patient reports taking 20 mg daily  . ferrous sulfate 325  (65 FE) MG tablet Take 325 mg by mouth every other day.   Marland Kitchen glucose blood test strip Use to check blood sugar 1 time per day.   . insulin lispro (HUMALOG KWIKPEN) 100 UNIT/ML KiwkPen Inject 0.07 mLs (7 Units total) into the skin 3 (three) times daily with meals. And pen needles 1/day   . Insulin Pen Needle 32G X 4 MM MISC Used to inject insulin 3x daily   . levothyroxine (SYNTHROID, LEVOTHROID) 50 MCG tablet TAKE 1 TABLET DAILY BEFORE BREAKFAST   . lidocaine (LIDODERM) 5 % Place 1 patch onto the skin daily. Remove & Discard patch within 12 hours or as directed by MD (Patient taking differently: Place 2 patches onto the skin daily as needed (pain). Remove & Discard patch within 12 hours or as directed by MD)   . phenytoin (DILANTIN) 100 MG ER capsule TAKE 3 CAPSULES AT BEDTIME (Patient taking differently: TAKE 3 CAPSULES AT BEDTIME. 2 capsules at bedtime and 1 in the morning) 10/07/2017: Taking 2 tablets at bedtime and 1 tablet in the morning  . PHENYTOIN INFATABS 50 MG tablet CHEW 1 TABLET DAILY (Patient taking differently: CHEW 1 TABLET EVERY MORNING.) 08/31/2017: 08/31/17 taking 1 capsule in AM, 2 caps at bedtime  . Polyethyl Glycol-Propyl Glycol 0.4-0.3 % SOLN Place 2 drops into both eyes at bedtime.  08/31/2017: Applies in Am and PM  . primidone (MYSOLINE) 50 MG tablet TAKE 2 TABLETS DAILY   . ranitidine (ZANTAC) 150 MG capsule Take 150 mg by mouth daily as needed for heartburn.    . traMADol (ULTRAM) 50 MG tablet Take 50 mg by mouth every 6 (six) hours as needed.    . trandolapril-verapamil (TARKA) 2-240 MG tablet Take 1 tablet by mouth daily.   . furosemide (LASIX) 20 MG tablet Take 1 tablet (20 mg total) by mouth daily. Do not take until I call you about labs. (Patient taking differently: Take 20 mg by mouth as needed. Do not take until I call you about labs.) 07/19/2014: .   No facility-administered encounter medications on file as of 10/07/2017.     Functional Status:  In your present state of  health, do you have any difficulty performing the following activities: 10/07/2017 07/19/2017  Hearing? N N  Vision? Y N  Comment double vision and some difficulty seeing -  Difficulty concentrating or making decisions? Y N  Walking or climbing stairs? Y Y  Dressing or bathing? N N  Doing errands, shopping? [name]  Preparing Food and eating ? Y Y  Using the Toilet? N N  In the past six months, have you accidently leaked urine? N Y  Do you have problems with loss of bowel control? Y N  Managing your Medications? Y N  Managing your Finances? N N  Housekeeping or managing your Housekeeping? Y Y  Some recent data might be hidden    Fall/Depression Screening: Fall Risk  10/07/2017 08/31/2017 07/12/2017  Falls in the past year? Yes Yes Yes  Number falls in past yr: 2 or more 2 or more 2 or more  Comment about 6 - -  Injury with Fall? Yes No  Yes  Comment fractured veterbrae and arm last year - -  Risk Factor Category  High Fall Risk - High Fall Risk  Risk for fall due to : History of fall(s);Impaired balance/gait;Impaired mobility Other (Comment) History of fall(s);Impaired vision;Impaired balance/gait;Medication side effect;Impaired mobility  Risk for fall due to: Comment - unsure of why she fell -  Follow up Education provided;Falls prevention discussed - Education provided;Falls prevention discussed;Falls evaluation completed   PHQ 2/9 Scores 10/07/2017 07/12/2017 07/01/2016 05/27/2016 05/13/2016 02/06/2013 11/19/2011  PHQ - 2 Score 0 0 0 0 0 0 0    THN CM Care Plan Problem One     Most Recent Value  Care Plan Problem One  Knowledge deficiet related to self management of falls, diarrhea and diabetes  Role Documenting the Problem One  Charlo for Problem One  Active  THN Long Term Goal   Patient will decrease Hgb A1C by 0.2 points in the next 90 days.  THN Long Term Goal Start Date  10/07/17  Interventions for Problem One Long Term Goal  Reviewed and discussed care plan and goals  with patient, encouraged to keep and attend medical appointments, reviewed medications with patient and encouraged medication compliance, discussed monitoring blood sugars more frequently and recording results, sending Living Well with Diabetes Booklet, sending 2019 Calendar Booklet to assist with appointments and recording blood sugars  THN CM Short Term Goal #1   Patient will report no falls in the next 30 days  THN CM Short Term Goal #1 Start Date  10/07/17  Interventions for Short Term Goal #1  Sending EMMI on Fall Prevention, sending EMMI on Getting Up From a Fall, reviewed fall precautions and preventions with patient, encouraged patient to wear life alert alarm at all times, discussed the use of walker or cane with ambulation  THN CM Short Term Goal #2   Patient will verbalize resolution of diarrhea in the next 30 days.  THN CM Short Term Goal #2 Start Date  10/07/17  Interventions for Short Term Goal #2  Discussed diagnosis of CDiff with patient, encouraged patient to stay hydrated, encouraged patient to keep and attend appointment with Gastroentrologist on Monday, discussed hand hygiene with patient, sending EMMI on CDiff      Appointments:  Patient states she last saw her primary care provider about 2 weeks ago (unsure of exact date), and has follow up with her in July 2019 (again unsure of exact date).  Patient encouraged to verify appointment date and time.  States she follows up with Gastroenterologist on Monday, June 10 for CDIFF diagnosis.  Patient has established care with a new Endocrinologist FNP and saw her on 08/20/2017.  Has follow up appointment on 11/19/2017.  Advanced Directives:  Verbalizes she has [place] in place and does not wish to make any adjustments to her advance directive at this time.   Consent:  [facility] services reviewed and discussed with patient.  Patient verbally agrees to monthly telephone outreaches.  Plan: RN Health Coach will send primary  MD barriers letter. RN Health Coach will route initial telephone assessment note to primary MD. [place] will send patient Welcome Packet. RN Health Coach will send patient Living Well with Diabetes Packet. RN Health Coach will send patient 2019 Calendar Booklet. RN Health Coach will send patient EMMI on Getting Up from a Fall. RN Health Coach will send patient EMMI on Preventing Falls. RN Health Coach will send patient EMMI on Clostridium Difficile  Discharge Instructions. RN Health Coach will make next monthly outreach to patient in the month of July.   [place] [phone] [email or web address] [email or web address]

## 2017-10-11 ENCOUNTER — Ambulatory Visit (INDEPENDENT_AMBULATORY_CARE_PROVIDER_SITE_OTHER): Payer: Medicare Other | Admitting: Podiatry

## 2017-10-11 ENCOUNTER — Encounter: Payer: Self-pay | Admitting: Podiatry

## 2017-10-11 DIAGNOSIS — E1151 Type 2 diabetes mellitus with diabetic peripheral angiopathy without gangrene: Secondary | ICD-10-CM | POA: Diagnosis not present

## 2017-10-11 DIAGNOSIS — M79674 Pain in right toe(s): Secondary | ICD-10-CM | POA: Diagnosis not present

## 2017-10-11 DIAGNOSIS — Q828 Other specified congenital malformations of skin: Secondary | ICD-10-CM

## 2017-10-11 DIAGNOSIS — A0472 Enterocolitis due to Clostridium difficile, not specified as recurrent: Secondary | ICD-10-CM | POA: Diagnosis not present

## 2017-10-11 DIAGNOSIS — B351 Tinea unguium: Secondary | ICD-10-CM

## 2017-10-11 DIAGNOSIS — M79675 Pain in left toe(s): Secondary | ICD-10-CM | POA: Diagnosis not present

## 2017-10-12 NOTE — Progress Notes (Signed)
Subjective: 84 y.o. returns the office today for painful, elongated, thickened toenails which she cannot trim herself as well as for painful calluses. Denies any redness or drainage around the nails/calluses.  SDenies any acute changes since last appointment and no new complaints today. Denies any systemic complaints such as fevers, chills, nausea, vomiting.   Objective: AAO 3, NAD DP/PT pulses palpable but somewhat decreased, CRT less than 3 seconds Protective sensation decreased with Simms Weinstein monofilament Nails hypertrophic, dystrophic, elongated, brittle, discolored 10. There is tenderness overlying the nails 1-5 bilaterally. There is no surrounding erythema or drainage along the nail sites. On the left second toe with a small hyperkeratotic lesion.  No underlying ulceration drainage or any signs of infection. Hyperkeratotic lesions bilateral submetatarsal 1.  Upon debridement no underlying ulceration drainage or any signs of infection. Prominent metatarsal heads plantarly with atrophy of the fat pad but hammertoe contractures present. No other areas of tenderness bilateral lower extremities. No overlying edema, erythema, increased warmth. No pain with calf compression, swelling, warmth, erythema.  Assessment: Patient presents with symptomatic onychomycosis; hyperkeratotic lesions  Plan: -Treatment options including alternatives, risks, complications were discussed -Nails sharply debrided 10 without complication/bleeding. -Hyperkeratotic pre-ulcerative lesions were sharply debrided today without any complications or bleeding x3 -Awaiting diabetic shoes -Discussed daily foot inspection. If there are any changes, to call the office immediately.  -Follow-up as scheduled or sooner if any problems are to arise. In the meantime, encouraged to call the office with any questions, concerns, changes symptoms.  Celesta Gentile, DPM

## 2017-11-08 DIAGNOSIS — M546 Pain in thoracic spine: Secondary | ICD-10-CM | POA: Diagnosis not present

## 2017-11-12 ENCOUNTER — Other Ambulatory Visit: Payer: Self-pay | Admitting: *Deleted

## 2017-11-12 ENCOUNTER — Encounter: Payer: Self-pay | Admitting: *Deleted

## 2017-11-12 NOTE — Patient Outreach (Signed)
[place]) Care Management  11/12/2017  [name] Mar 24, 1930 [id number]   RN Health Coach Monthly Outreach  Referral Date:09/06/2017 Referral Source:Transfer from [facility] Nurse Reason for Referral:Continued Disease Management Education Insurance:Medicare   Outreach Attempt:  Successful telephone outreach to patient for monthly telephone assessment.  HIPAA verified with patient.  Patient verbalizing she is having elevated blood sugars related to a steroid injection in her back she received on July 1st.  Reports her blood sugar had gotten as high as 550.   At that time she called the on call physician for her Endocrinologist who suggested she increased insulin doses to 10 units three times a day while her blood sugars are elevated.  Patient reports fasting blood sugar this morning was 227 and states that her fasting ranges has decreased to 200's with increase in insulin doses.  Patient also reporting she had a recent death in her family and she feels the stress of the death is contributing to her elevated blood sugars.  Continues to take the increase doses of insulin for now.  Reports her diarrhea/CDIFF has subsided.  Denies any falls in the last month.  Appointments:  Patient reporting she has scheduled appointments, 11/22/2017 with Dr. Ernie Hew and 11/19/2017 with NP Huffman.  Patient encouraged to keep and attend these appointments.  Plan: RN Health Coach will resend patient Copy. RN Health Coach will resend patient Living Well with Diabetes Packet. RN Health Coach will resend patient 2019 Calendar Booklet. RN Health Coach will resend patient EMMI on Getting Up from a Fall. RN Health Coach will resend patient EMMI on Preventing Falls. RN Health Coach will make next monthly outreach to patient in the month of August.  Yolanda Kehres RN [place] [phone] [name]

## 2017-11-15 ENCOUNTER — Other Ambulatory Visit: Payer: Self-pay | Admitting: *Deleted

## 2017-11-15 ENCOUNTER — Other Ambulatory Visit: Payer: Medicare Other | Admitting: Orthotics

## 2017-11-15 DIAGNOSIS — I10 Essential (primary) hypertension: Secondary | ICD-10-CM | POA: Diagnosis not present

## 2017-11-15 DIAGNOSIS — E119 Type 2 diabetes mellitus without complications: Secondary | ICD-10-CM | POA: Diagnosis not present

## 2017-11-15 DIAGNOSIS — E039 Hypothyroidism, unspecified: Secondary | ICD-10-CM | POA: Diagnosis not present

## 2017-11-15 DIAGNOSIS — D649 Anemia, unspecified: Secondary | ICD-10-CM | POA: Diagnosis not present

## 2017-11-15 DIAGNOSIS — K5900 Constipation, unspecified: Secondary | ICD-10-CM | POA: Diagnosis not present

## 2017-11-15 DIAGNOSIS — E782 Mixed hyperlipidemia: Secondary | ICD-10-CM | POA: Diagnosis not present

## 2017-11-15 DIAGNOSIS — E559 Vitamin D deficiency, unspecified: Secondary | ICD-10-CM | POA: Diagnosis not present

## 2017-11-15 DIAGNOSIS — Z79899 Other long term (current) drug therapy: Secondary | ICD-10-CM | POA: Diagnosis not present

## 2017-11-15 MED ORDER — PHENYTOIN SODIUM EXTENDED 100 MG PO CAPS: ORAL_CAPSULE | ORAL | 1 refills | Status: DC

## 2017-11-15 NOTE — Progress Notes (Signed)
I called pt. She reported that she takes 100mg  ER (2 caps at bedtime and 1 cap in the am). [facility]

## 2017-11-17 ENCOUNTER — Other Ambulatory Visit: Payer: Self-pay | Admitting: *Deleted

## 2017-11-17 MED ORDER — PHENYTOIN SODIUM EXTENDED 100 MG PO CAPS
ORAL_CAPSULE | ORAL | 1 refills | Status: DC
Start: 2017-11-17 — End: 2018-06-27

## 2017-11-19 DIAGNOSIS — E119 Type 2 diabetes mellitus without complications: Secondary | ICD-10-CM | POA: Diagnosis not present

## 2017-11-19 DIAGNOSIS — Z79890 Hormone replacement therapy (postmenopausal): Secondary | ICD-10-CM | POA: Diagnosis not present

## 2017-11-19 DIAGNOSIS — E785 Hyperlipidemia, unspecified: Secondary | ICD-10-CM | POA: Diagnosis not present

## 2017-11-19 DIAGNOSIS — Z794 Long term (current) use of insulin: Secondary | ICD-10-CM | POA: Diagnosis not present

## 2017-11-19 DIAGNOSIS — Q782 Osteopetrosis: Secondary | ICD-10-CM | POA: Diagnosis not present

## 2017-11-19 DIAGNOSIS — E039 Hypothyroidism, unspecified: Secondary | ICD-10-CM | POA: Diagnosis not present

## 2017-11-22 DIAGNOSIS — Z6823 Body mass index (BMI) 23.0-23.9, adult: Secondary | ICD-10-CM | POA: Diagnosis not present

## 2017-11-22 DIAGNOSIS — E039 Hypothyroidism, unspecified: Secondary | ICD-10-CM | POA: Diagnosis not present

## 2017-11-22 DIAGNOSIS — E559 Vitamin D deficiency, unspecified: Secondary | ICD-10-CM | POA: Diagnosis not present

## 2017-11-22 DIAGNOSIS — F411 Generalized anxiety disorder: Secondary | ICD-10-CM | POA: Diagnosis not present

## 2017-11-22 DIAGNOSIS — E782 Mixed hyperlipidemia: Secondary | ICD-10-CM | POA: Diagnosis not present

## 2017-11-30 ENCOUNTER — Ambulatory Visit (INDEPENDENT_AMBULATORY_CARE_PROVIDER_SITE_OTHER): Payer: Medicare Other | Admitting: Orthotics

## 2017-11-30 DIAGNOSIS — L821 Other seborrheic keratosis: Secondary | ICD-10-CM | POA: Diagnosis not present

## 2017-11-30 DIAGNOSIS — E1142 Type 2 diabetes mellitus with diabetic polyneuropathy: Secondary | ICD-10-CM

## 2017-11-30 DIAGNOSIS — D1801 Hemangioma of skin and subcutaneous tissue: Secondary | ICD-10-CM | POA: Diagnosis not present

## 2017-11-30 DIAGNOSIS — L97512 Non-pressure chronic ulcer of other part of right foot with fat layer exposed: Secondary | ICD-10-CM

## 2017-11-30 DIAGNOSIS — Z85820 Personal history of malignant melanoma of skin: Secondary | ICD-10-CM | POA: Diagnosis not present

## 2017-11-30 DIAGNOSIS — D229 Melanocytic nevi, unspecified: Secondary | ICD-10-CM | POA: Diagnosis not present

## 2017-11-30 DIAGNOSIS — Q828 Other specified congenital malformations of skin: Secondary | ICD-10-CM | POA: Diagnosis not present

## 2017-11-30 DIAGNOSIS — E1151 Type 2 diabetes mellitus with diabetic peripheral angiopathy without gangrene: Secondary | ICD-10-CM

## 2017-11-30 DIAGNOSIS — E0842 Diabetes mellitus due to underlying condition with diabetic polyneuropathy: Secondary | ICD-10-CM | POA: Diagnosis not present

## 2017-11-30 DIAGNOSIS — L570 Actinic keratosis: Secondary | ICD-10-CM | POA: Diagnosis not present

## 2017-11-30 DIAGNOSIS — L814 Other melanin hyperpigmentation: Secondary | ICD-10-CM | POA: Diagnosis not present

## 2017-11-30 NOTE — Progress Notes (Signed)

## 2017-12-06 DIAGNOSIS — I10 Essential (primary) hypertension: Secondary | ICD-10-CM | POA: Diagnosis not present

## 2017-12-06 DIAGNOSIS — M545 Low back pain: Secondary | ICD-10-CM | POA: Diagnosis not present

## 2017-12-06 DIAGNOSIS — M546 Pain in thoracic spine: Secondary | ICD-10-CM | POA: Diagnosis not present

## 2017-12-07 ENCOUNTER — Ambulatory Visit: Payer: Self-pay | Admitting: *Deleted

## 2017-12-13 ENCOUNTER — Ambulatory Visit (INDEPENDENT_AMBULATORY_CARE_PROVIDER_SITE_OTHER): Payer: Medicare Other | Admitting: Podiatry

## 2017-12-13 ENCOUNTER — Encounter: Payer: Self-pay | Admitting: Podiatry

## 2017-12-13 DIAGNOSIS — M79675 Pain in left toe(s): Secondary | ICD-10-CM | POA: Diagnosis not present

## 2017-12-13 DIAGNOSIS — E1151 Type 2 diabetes mellitus with diabetic peripheral angiopathy without gangrene: Secondary | ICD-10-CM

## 2017-12-13 DIAGNOSIS — B351 Tinea unguium: Secondary | ICD-10-CM | POA: Diagnosis not present

## 2017-12-13 DIAGNOSIS — Q828 Other specified congenital malformations of skin: Secondary | ICD-10-CM

## 2017-12-13 DIAGNOSIS — M79674 Pain in right toe(s): Secondary | ICD-10-CM

## 2017-12-14 ENCOUNTER — Other Ambulatory Visit: Payer: Self-pay | Admitting: *Deleted

## 2017-12-14 NOTE — Patient Outreach (Signed)
[place] [facility]) Care Management  12/14/2017  [name] 09-22-1929 [id number]   RN Health Coach Monthly Outreach  Referral Date:09/06/2017 Referral Source:Transfer from [facility] Nurse Reason for Referral:Continued Disease Management Education Insurance:Medicare   Outreach Attempt:  Outreach attempt #1 to patient for monthly follow up. No answer. RN Health Coach left HIPAA compliant voicemail message along with contact information.  Plan:  RN Health Coach will send unsuccessful outreach letter to patient.  RN Health Coach will make another outreach attempt to patient within 5 business days if no return call back from patient.  [place] [phone] [email or web address]

## 2017-12-14 NOTE — Progress Notes (Signed)
Subjective: 84 y.o. returns the office today for painful, elongated, thickened toenails which she cannot trim herself as well as for painful calluses. Denies any redness or drainage around the nails/calluses.  She states the shoes feel great and have helped very much so. Denies any acute changes since last appointment and no new complaints today. Denies any systemic complaints such as fevers, chills, nausea, vomiting.   Today she reports that she did fall last week and she hit her head but she did not get the hospital.  She states that her head does not hurt she said no other injury.  Objective: AAO 3, NAD DP/PT pulses palpable but somewhat decreased, CRT less than 3 seconds Protective sensation decreased with Simms Weinstein monofilament Nails hypertrophic, dystrophic, elongated, brittle, discolored 10. There is tenderness overlying the nails 1-5 bilaterally. There is no surrounding erythema or drainage along the nail sites. On the left second toe with a small hyperkeratotic lesion.  No underlying ulceration drainage or any signs of infection. Hyperkeratotic lesions bilateral submetatarsal 1.  Upon debridement no underlying ulceration drainage or any signs of infection. Prominent metatarsal heads plantarly with atrophy of the fat pad but hammertoe contractures present. No pain with calf compression, swelling, warmth, erythema.  Assessment: Patient presents with symptomatic onychomycosis; hyperkeratotic lesions  Plan: -Treatment options including alternatives, risks, complications were discussed -Nails sharply debrided 10 without complication/bleeding. -Hyperkeratotic pre-ulcerative lesions were sharply debrided today without any complications or bleeding x3 -Diabetic shoes are fitting well.  She is very happy with this. -She does report a fall and injuring her head although she denies any symptoms she did not go to the hospital or see her doctor.  I encouraged her to follow with her  primary care physician.  She tripped going down steps while carrying things on both hands -Discussed daily foot inspection. If there are any changes, to call the office immediately.  -Follow-up as scheduled or sooner if any problems are to arise. In the meantime, encouraged to call the office with any questions, concerns, changes symptoms.  Celesta Gentile, DPM

## 2017-12-17 ENCOUNTER — Other Ambulatory Visit: Payer: Self-pay | Admitting: *Deleted

## 2017-12-17 NOTE — Patient Outreach (Signed)
[facility]) Care Management  12/17/2017  [name] [place] [birth year] [id number]   RN Health Coach Monthly Outreach  Referral Date:09/06/2017 Referral Source:Transfer from [facility] Nurse Reason for Referral:Continued Disease Management Education Insurance:Medicare  Outreach Attempt:  Outreach attempt #2 to patient for monthly follow up. No answer. RN Health Coach left HIPAA compliant voicemail message along with contact information.  Plan:  RN Health Coach will make another outreach attempt to patient within 3-4 business days if no return call back from patient.  [name] [phone] [name]

## 2017-12-23 ENCOUNTER — Other Ambulatory Visit: Payer: Self-pay | Admitting: *Deleted

## 2017-12-23 ENCOUNTER — Encounter: Payer: Self-pay | Admitting: *Deleted

## 2017-12-23 NOTE — Patient Outreach (Signed)
[facility]) Care Management  12/23/2017  [name] [birth year] [id number]   RN Health Coach Monthly Outreach  Referral Date:09/06/2017 Referral Source:Transfer from [facility] Nurse Reason for Referral:Continued Disease Management Education Insurance:Medicare   Outreach Attempt: Successful telephone outreach to patient for monthly follow up.  HIPAA verified with patient. Patient reporting a fall last Friday, 12/17/2017.  States she fell down about 2-3 stairs while her hands were full preventing her from holding onto the rail and hitting the back of her head on the concrete. Denies any injuries she is aware of but states she did not seek medical attention.  Does report being weak and sore and states she has had urinary frequency and burning and bowel frequency as well.  Wonders if she has a urinary tract infection.  Encouraged patient to contact her physician to be seen and patient stating she thinks she will await upcoming appointment next Tuesday.  Patient stating since the fall she has not felt up to keeping a check on her blood sugars.  Today's blood sugar was 189, after eating lunch.  Reports still taking 10 units meal coverage insulin.  Acknowledges one hypoglycemic episode of blood sugar in 70's and being very symptomatic.  Encouraged patient to monitor blood sugars with insulin administration or at least twice a day as recommended by physician.  Fall precautions and preventions reviewed and discussed.  Appointments:  Reports scheduled appointment with Dr. Ernie Hew on Tuesday, 12/28/2017.  Attended appointment with Endocrinology on 11/19/2017 and scheduled follow up is 01/17/2018.  Plan:  RN Health Coach will make next monthly outreach to patient in the month of September.   [place] [phone] [email or web address]

## 2017-12-24 DIAGNOSIS — R197 Diarrhea, unspecified: Secondary | ICD-10-CM | POA: Diagnosis not present

## 2017-12-24 DIAGNOSIS — Z8619 Personal history of other infectious and parasitic diseases: Secondary | ICD-10-CM | POA: Diagnosis not present

## 2017-12-24 DIAGNOSIS — D649 Anemia, unspecified: Secondary | ICD-10-CM | POA: Diagnosis not present

## 2017-12-28 DIAGNOSIS — I10 Essential (primary) hypertension: Secondary | ICD-10-CM | POA: Diagnosis not present

## 2017-12-28 DIAGNOSIS — E039 Hypothyroidism, unspecified: Secondary | ICD-10-CM | POA: Diagnosis not present

## 2017-12-28 DIAGNOSIS — Z79899 Other long term (current) drug therapy: Secondary | ICD-10-CM | POA: Diagnosis not present

## 2017-12-28 DIAGNOSIS — E119 Type 2 diabetes mellitus without complications: Secondary | ICD-10-CM | POA: Diagnosis not present

## 2017-12-28 DIAGNOSIS — R609 Edema, unspecified: Secondary | ICD-10-CM | POA: Diagnosis not present

## 2017-12-28 DIAGNOSIS — D649 Anemia, unspecified: Secondary | ICD-10-CM | POA: Diagnosis not present

## 2017-12-28 DIAGNOSIS — E782 Mixed hyperlipidemia: Secondary | ICD-10-CM | POA: Diagnosis not present

## 2018-01-04 DIAGNOSIS — Z23 Encounter for immunization: Secondary | ICD-10-CM | POA: Diagnosis not present

## 2018-01-24 DIAGNOSIS — F411 Generalized anxiety disorder: Secondary | ICD-10-CM | POA: Diagnosis not present

## 2018-01-24 DIAGNOSIS — D519 Vitamin B12 deficiency anemia, unspecified: Secondary | ICD-10-CM | POA: Diagnosis not present

## 2018-01-24 DIAGNOSIS — E1165 Type 2 diabetes mellitus with hyperglycemia: Secondary | ICD-10-CM | POA: Diagnosis not present

## 2018-01-24 DIAGNOSIS — Z6822 Body mass index (BMI) 22.0-22.9, adult: Secondary | ICD-10-CM | POA: Diagnosis not present

## 2018-01-24 DIAGNOSIS — D649 Anemia, unspecified: Secondary | ICD-10-CM | POA: Diagnosis not present

## 2018-01-24 DIAGNOSIS — Z794 Long term (current) use of insulin: Secondary | ICD-10-CM | POA: Diagnosis not present

## 2018-01-24 DIAGNOSIS — Q782 Osteopetrosis: Secondary | ICD-10-CM | POA: Diagnosis not present

## 2018-01-24 DIAGNOSIS — E119 Type 2 diabetes mellitus without complications: Secondary | ICD-10-CM | POA: Diagnosis not present

## 2018-01-24 DIAGNOSIS — E039 Hypothyroidism, unspecified: Secondary | ICD-10-CM | POA: Diagnosis not present

## 2018-01-24 DIAGNOSIS — I10 Essential (primary) hypertension: Secondary | ICD-10-CM | POA: Diagnosis not present

## 2018-01-25 ENCOUNTER — Encounter: Payer: Self-pay | Admitting: *Deleted

## 2018-01-25 ENCOUNTER — Other Ambulatory Visit: Payer: Self-pay | Admitting: *Deleted

## 2018-01-25 NOTE — Patient Outreach (Signed)
[facility]) Care Management  [place]  01/25/2018   [name] 05-27-1929 [id number]   [place] Monthly Outreach   Referral Date:  09/06/2017 Referral Source:  Transfer from [place] Reason for Referral:  Continued Disease Management Education Insurance:  Medicare   Outreach Attempt:  Successful telephone outreach to patient for monthly follow up.  HIPAA verified with patient.  Patient reporting she is feeling some better.  Continues to report back pain being managed by pain medication and pain patches.  States she is going to try to not get another steroid injection for as long as she can tolerate.  Has been monitoring her blood sugars.  Fasting blood sugar this morning was 200 with fasting ranges 200-210's.  Reports Hgb A1C checked yesterday was 6.9.  Denies any falls since last month's fall.  Reports she is less sore from the previous fall.  Encounter Medications:  Outpatient Encounter Medications as of 01/25/2018  Medication Sig Note  . ALPRAZolam (XANAX) 0.25 MG tablet Take 1 tablet (0.25 mg total) by mouth at bedtime. 09/07/2017: [facility] at [phone]. Received fax confirmation.    Marland Kitchen atorvastatin (LIPITOR) 20 MG tablet Take 1 tablet by mouth daily. 10/07/2017: Patient stated she is taking medication now  . Biotin 1000 MCG tablet Take 1,000 mcg by mouth daily.    . Blood Glucose Monitoring Suppl (PRODIGY VOICE BLOOD GLUCOSE) w/Device KIT Use to check blood sugar 1 time per day.   . Cholecalciferol 5000 units capsule Take 10,000 Units by mouth daily.  07/20/2017: Pt reports she was told to take 2 tablets daily b/c she had low Vit D.  . colestipol (COLESTID) 1 G tablet Take 1-2 g by mouth 3 (three) times daily. Take 2 tablets in the morning, 1 tablet at noon, and 1 tablet in the evening. (per pt report) 10/07/2017: Patient reports taking 2 tablets in morning and 2 tablets at night  . cyanocobalamin (,VITAMIN  B-12,) 1000 MCG/ML injection INJECT 1 ML INTRAMUSCULARLY EVERY 21 DAYS   . escitalopram (LEXAPRO) 10 MG tablet Take 10 mg by mouth daily. 01/25/2018: Taking 10 mg daily  . ferrous sulfate 325 (65 FE) MG tablet Take 325 mg by mouth every other day.   . furosemide (LASIX) 20 MG tablet Take 1 tablet (20 mg total) by mouth daily. Do not take until I call you about labs. (Patient taking differently: Take 20 mg by mouth as needed. Do not take until I call you about labs.) 07/19/2014: .  . glucose blood test strip Use to check blood sugar 1 time per day.   . insulin lispro (HUMALOG KWIKPEN) 100 UNIT/ML KiwkPen Inject 0.07 mLs (7 Units total) into the skin 3 (three) times daily with meals. And pen needles 1/day 11/12/2017: Currently taking 10 units 3 times a day after steroid injection for time being  . Insulin Pen Needle 32G X 4 MM MISC Used to inject insulin 3x daily   . levothyroxine (SYNTHROID, LEVOTHROID) 50 MCG tablet TAKE 1 TABLET DAILY BEFORE BREAKFAST 01/25/2018: Reports taking 75 mcg daily  . lidocaine (LIDODERM) 5 % Place 1 patch onto the skin daily. Remove & Discard patch within 12 hours or as directed by MD (Patient taking differently: Place 2 patches onto the skin daily as needed (pain). Remove & Discard patch within 12 hours or as directed by MD)   . phenytoin (DILANTIN) 100 MG ER capsule 2 capsules at bedtime and 1 in the  morning   . PHENYTOIN INFATABS 50 MG tablet CHEW 1 TABLET DAILY (Patient taking differently: CHEW 1 TABLET EVERY MORNING.) 08/31/2017: 08/31/17 taking 1 capsule in AM, 2 caps at bedtime  . Polyethyl Glycol-Propyl Glycol 0.4-0.3 % SOLN Place 2 drops into both eyes at bedtime.  08/31/2017: Applies in Am and PM  . primidone (MYSOLINE) 50 MG tablet TAKE 2 TABLETS DAILY   . ranitidine (ZANTAC) 150 MG capsule Take 150 mg by mouth daily as needed for heartburn.    . traMADol (ULTRAM) 50 MG tablet Take 50 mg by mouth every 6 (six) hours as needed.    . trandolapril-verapamil (TARKA) 2-240  MG tablet Take 1 tablet by mouth daily.   . DiphenhydrAMINE HCl (BENADRYL ALLERGY PO) Take 1 tablet by mouth as needed.    No facility-administered encounter medications on file as of 01/25/2018.     Functional Status:  In your present state of health, do you have any difficulty performing the following activities: 10/07/2017 07/19/2017  Hearing? N N  Vision? Y N  Comment double vision and some difficulty seeing -  Difficulty concentrating or making decisions? Y N  Walking or climbing stairs? Y Y  Dressing or bathing? N N  Doing errands, shopping? [name]  Preparing Food and eating ? Y Y  Using the Toilet? N N  In the past six months, have you accidently leaked urine? N Y  Do you have problems with loss of bowel control? Y N  Managing your Medications? Y N  Managing your Finances? N N  Housekeeping or managing your Housekeeping? Y Y  Some recent data might be hidden    Fall/Depression Screening: Fall Risk  01/25/2018 12/23/2017 11/12/2017  Falls in the past year? Yes Yes Yes  Comment no fall since 12/17/2017 Fall 12/17/2017 down stairs No falls in the last month  Number falls in past yr: 2 or more 2 or more 2 or more  Comment - - -  Injury with Fall? Yes Yes Yes  Comment - - -  Risk Factor Category  High Fall Risk High Fall Risk High Fall Risk  Risk for fall due to : History of fall(s);Impaired balance/gait;Medication side effect;Impaired mobility;Impaired vision Impaired balance/gait;Impaired mobility;Medication side effect;Impaired vision;History of fall(s) History of fall(s);Impaired balance/gait;Medication side effect;Impaired mobility  Risk for fall due to: Comment - - -  Follow up Falls prevention discussed;Education provided Falls evaluation completed;Education provided;Falls prevention discussed;Follow up appointment Education provided;Falls prevention discussed   PHQ 2/9 Scores 10/07/2017 07/12/2017 07/01/2016 05/27/2016 05/13/2016 02/06/2013 11/19/2011  PHQ - 2 Score 0 0 0 0 0 0 0     THN CM Care Plan Problem One     Most Recent Value  Care Plan Problem One  Knowledge deficiet related to self management of falls and diabetes  Role Documenting the Problem One  [place] for Problem One  Active  THN Long Term Goal   Patient will maintain Hgb A1C at or below 7 in the next 90 days  THN Long Term Goal Start Date  01/25/18  [facility] Long Term Goal Met Date  01/25/18  Interventions for Problem One Long Term Goal  Congrtulated patient on current Hgb A1C of 6.9, discussed pain management, encouraged to contact provider for sustained elevated blood sugars, fall precautions and preventions discussed, encouraged to keep and attend medical appointments  THN CM Short Term Goal #1   Patient will report fasting blood sugar ranges of less than 200 in the next 90  days.  THN CM Short Term Goal #1 Start Date  01/25/18  [facility] CM Short Term Goal #1 Met Date  01/25/18  Interventions for Short Term Goal #1  Reviewed signs and symptoms of hypo and hyperglycemia, encouraged healthier lower carbohydrate food and drink options,  reviewed medications and encoruaged compliance  THN CM Short Term Goal #2      THN CM Short Term Goal #2 Start Date  12/23/17  [facility] CM Short Term Goal #2 Met Date  01/25/18     Appointments:  Reports attending appointments with Dr. Ernie Hew, primary care and Jacolyn Reedy NP with Endocrinology on yesterday, 01/24/2018.  Follow up with endocrinology she thinks is in December 2019, she is to confirm.  Scheduled follow up with primary care is 02/07/2018 for a wellness visit.  Plan: RN Health Coach will send primary care provider Quarterly Update. RN Health Coach will make next telephone outreach in the month of December.  [place] [phone] [email or web address]

## 2018-01-28 ENCOUNTER — Other Ambulatory Visit (HOSPITAL_COMMUNITY): Payer: Self-pay

## 2018-01-31 ENCOUNTER — Ambulatory Visit (HOSPITAL_COMMUNITY)
Admission: RE | Admit: 2018-01-31 | Discharge: 2018-01-31 | Disposition: A | Discharge status: Home / Self Care | Payer: Medicare Other | Source: Ambulatory Visit | Attending: Nurse Practitioner | Admitting: Nurse Practitioner

## 2018-01-31 DIAGNOSIS — H524 Presbyopia: Secondary | ICD-10-CM | POA: Diagnosis not present

## 2018-01-31 DIAGNOSIS — H26493 Other secondary cataract, bilateral: Secondary | ICD-10-CM | POA: Diagnosis not present

## 2018-01-31 DIAGNOSIS — M810 Age-related osteoporosis without current pathological fracture: Secondary | ICD-10-CM | POA: Insufficient documentation

## 2018-01-31 MED ORDER — ZOLEDRONIC ACID 5 MG/100ML IV SOLN
INTRAVENOUS | Status: AC
Start: 2018-01-31 — End: 2018-01-31
  Administered 2018-01-31: 5 mg via INTRAVENOUS
  Filled 2018-01-31: qty 100

## 2018-01-31 MED ORDER — ZOLEDRONIC ACID 5 MG/100ML IV SOLN
5.0000 mg | Freq: Once | INTRAVENOUS | Status: AC
  Administered 2018-01-31: 5 mg via INTRAVENOUS

## 2018-02-07 DIAGNOSIS — Z0000 Encounter for general adult medical examination without abnormal findings: Secondary | ICD-10-CM | POA: Diagnosis not present

## 2018-02-07 DIAGNOSIS — Z6822 Body mass index (BMI) 22.0-22.9, adult: Secondary | ICD-10-CM | POA: Diagnosis not present

## 2018-02-21 ENCOUNTER — Ambulatory Visit (INDEPENDENT_AMBULATORY_CARE_PROVIDER_SITE_OTHER): Payer: Medicare Other | Admitting: Podiatry

## 2018-02-21 ENCOUNTER — Encounter: Payer: Self-pay | Admitting: Podiatry

## 2018-02-21 DIAGNOSIS — E1151 Type 2 diabetes mellitus with diabetic peripheral angiopathy without gangrene: Secondary | ICD-10-CM | POA: Diagnosis not present

## 2018-02-21 DIAGNOSIS — B351 Tinea unguium: Secondary | ICD-10-CM | POA: Diagnosis not present

## 2018-02-21 DIAGNOSIS — Q828 Other specified congenital malformations of skin: Secondary | ICD-10-CM | POA: Diagnosis not present

## 2018-02-21 DIAGNOSIS — M79675 Pain in left toe(s): Secondary | ICD-10-CM | POA: Diagnosis not present

## 2018-02-21 DIAGNOSIS — M79674 Pain in right toe(s): Secondary | ICD-10-CM

## 2018-02-22 DIAGNOSIS — H5340 Unspecified visual field defects: Secondary | ICD-10-CM | POA: Diagnosis not present

## 2018-02-22 DIAGNOSIS — Z961 Presence of intraocular lens: Secondary | ICD-10-CM | POA: Diagnosis not present

## 2018-02-22 NOTE — Progress Notes (Signed)
Subjective: 84 y.o. returns the office today for painful, elongated, thickened toenails which she cannot trim herself as well as for painful calluses. Denies any redness or drainage around the nails/calluses. Denies any acute changes since last appointment and no new complaints today. Denies any systemic complaints such as fevers, chills, nausea, vomiting.   Objective: AAO 3, NAD DP/PT pulses palpable but somewhat decreased, CRT less than 3 seconds Protective sensation decreased with Simms Weinstein monofilament Nails hypertrophic, dystrophic, elongated, brittle, discolored 10. There is tenderness overlying the nails 1-5 bilaterally. There is no surrounding erythema or drainage along the nail sites. On the left second toe with a small hyperkeratotic lesion.  No underlying ulceration drainage or any signs of infection. Hyperkeratotic lesions bilateral submetatarsal 1 and left dorsal 2nd toe.  Upon debridement no underlying ulceration drainage or any signs of infection. Prominent metatarsal heads plantarly with atrophy of the fat pad but hammertoe contractures present. No pain with calf compression, swelling, warmth, erythema.  Assessment: Patient presents with symptomatic onychomycosis; hyperkeratotic lesions  Plan: -Treatment options including alternatives, risks, complications were discussed -Nails sharply debrided 10 without complication/bleeding. -Hyperkeratotic pre-ulcerative lesions were sharply debrided today without any complications or bleeding x3 -Discussed daily foot inspection. If there are any changes, to call the office immediately.  -Follow-up as scheduled or sooner if any problems are to arise. In the meantime, encouraged to call the office with any questions, concerns, changes symptoms.  Celesta Gentile, DPM

## 2018-02-24 ENCOUNTER — Other Ambulatory Visit: Payer: Self-pay | Admitting: Family Medicine

## 2018-02-24 DIAGNOSIS — Z853 Personal history of malignant neoplasm of breast: Secondary | ICD-10-CM

## 2018-03-15 ENCOUNTER — Ambulatory Visit: Payer: Medicare Other | Admitting: Adult Health

## 2018-03-17 ENCOUNTER — Telehealth: Payer: Self-pay | Admitting: Adult Health

## 2018-03-17 NOTE — Telephone Encounter (Addendum)
Spoke with patient and advised her to call her PCP. She stated she did and was asked to come in and be seen. She stated she's not been out of bed since Sunday and doesn't feel like going anywhere, has never felt this bad. This RN advised she call her PCP again and find a way to get to their office to be seen. Advised her she [place] have something more than a cold, and she needs to be seen to be evaluated. This RN advised her she is treated here for seizures only, and it is appropriate for her PCP to see her and treat her for her current issues. She verbalized understanding.  This RN called her niece, Hoyle Sauer on [place] and advised her of above conversation. She saw Ms. Ahles on Monday and stated the patient was up, ate and didn't have a fever. She is taking mucinex and OTC for cough. She has caregivers each day. Tomorrow her sister will be visiting, and Hoyle Sauer stated her sister will take her to the dr if she needs to go. Hoyle Sauer assured this RN that Ms Murillo is taken good care. She thanked this Therapist, sports for this call.

## 2018-03-17 NOTE — Telephone Encounter (Signed)
Pt is asking for NP Megan to have an antibiotic called in for her because of a bad cold she has been trying to get over.Please call  Pt uses  [facility] [phone] - [place], [place]

## 2018-03-18 DIAGNOSIS — J019 Acute sinusitis, unspecified: Secondary | ICD-10-CM | POA: Diagnosis not present

## 2018-03-21 ENCOUNTER — Other Ambulatory Visit: Payer: Self-pay | Admitting: Adult Health

## 2018-03-21 DIAGNOSIS — R1084 Generalized abdominal pain: Secondary | ICD-10-CM | POA: Diagnosis not present

## 2018-03-21 MED ORDER — ALPRAZOLAM 0.25 MG PO TABS
0.2500 mg | ORAL_TABLET | Freq: Every day | ORAL | 0 refills | Status: DC
Start: 2018-03-21 — End: 2018-04-04

## 2018-03-21 NOTE — Telephone Encounter (Signed)
[place] Database Verified LR 12-17-17 Qty: 90

## 2018-03-21 NOTE — Telephone Encounter (Signed)
Pt called stating she is currently out of medication for ALPRAZolam (XANAX) 0.25 MG tablet [facility]

## 2018-03-28 ENCOUNTER — Ambulatory Visit (INDEPENDENT_AMBULATORY_CARE_PROVIDER_SITE_OTHER): Payer: Medicare Other | Admitting: Neurology

## 2018-03-28 ENCOUNTER — Encounter: Payer: Self-pay | Admitting: Neurology

## 2018-03-28 VITALS — BP 138/80 | HR 65 | Ht 67.5 in | Wt 144.0 lb

## 2018-03-28 DIAGNOSIS — R569 Unspecified convulsions: Secondary | ICD-10-CM | POA: Diagnosis not present

## 2018-03-28 DIAGNOSIS — G250 Essential tremor: Secondary | ICD-10-CM

## 2018-03-28 MED ORDER — PRIMIDONE 50 MG PO TABS: ORAL_TABLET | ORAL | 1 refills | Status: DC

## 2018-03-28 NOTE — Progress Notes (Signed)
Reason for visit: Seizures, essential tremor  [name] is an 84 y.o. female  History of present illness:  [name] is an 84 year old right-handed white female with a history of an essential tremor.  The patient believes that the Mysoline has worked well but the effects of the medication are wearing off towards the evening.  The patient is having some anxiety issues, she will take alprazolam if needed.  The patient has not had any recent seizures, she Custodio have had an event in [place] 2019, she had a bitten tongue and was not sure exactly how it happened.  The patient has had some gait instability, she underwent physical therapy about 1 year ago, she had a recent fall about 2 weeks ago and bruised her knees.  The patient has difficulty getting up when she falls.  She does not use a cane or a walker.  She does have some mild memory issues that have remained stable over time.  Past Medical History:  Diagnosis Date  . Anemia   . Anxiety   . Breast cancer ([place]) 2014   right breast  . Cancer of right breast ([place]) 12/26/12   right breast 12:00 o'clock, DCIS  . Chronic cough   . Chronic facial pain   . Chronic foot pain   . Complication of anesthesia    Sore jaw; could not chew or move mouth  . Convulsions/seizures ([place]) 10/16/2014  . Diabetes mellitus    type 2 niddm x 20 years  . Dyslipidemia   . Ejection fraction   . GERD (gastroesophageal reflux disease)   . Hammer toe    bilateral  . History of colonic polyps   . HTN (hypertension)   . Hx of radiation therapy 03/07/13- 03/29/13   right breast 4250 cGy 17 sessions  . Hyperlipidemia   . Melanoma ([place])   . Metatarsal bone fracture 2014  . Obesity   . Osteoarthritis   . Palpitations   . Personal history of radiation therapy 2014  . Seasonal allergies   . Skin cancer   . Tremor, essential 08/18/2016  . Vitamin B12 deficiency     Past Surgical History:  Procedure Laterality Date  . ABDOMINAL HYSTERECTOMY    . BRAIN SURGERY    .  BREAST BIOPSY Right 01/24/2013   Procedure: RE-EXCICION OF BREAST CANCER, ANTERIOR MARGINS;  Surgeon: Edward Jolly, MD;  Location: [facility];  Service: General;  Laterality: Right;  . BREAST LUMPECTOMY Right 2014  . BREAST LUMPECTOMY WITH NEEDLE LOCALIZATION Right 01/17/2013   Procedure: BREAST LUMPECTOMY WITH NEEDLE LOCALIZATION;  Surgeon: Edward Jolly, MD;  Location: [place] [facility];  Service: General;  Laterality: Right;  . BUNIONECTOMY    . CATARACT EXTRACTION W/ INTRAOCULAR LENS IMPLANT Right   . CHOLECYSTECTOMY    . COLONOSCOPY    . CRANIOTOMY Right 10/18/2013   Procedure: CRANIOTOMY TUMOR EXCISION;  Surgeon: Floyce Stakes, MD;  Location: [facility];  Service: Neurosurgery;  Laterality: Right;  . EYE SURGERY    . HERNIA REPAIR    . KNEE ARTHROSCOPY Bilateral   . POLYPECTOMY     small adenomatous  . ULNAR TUNNEL RELEASE      Family History  Problem Relation Age of Onset  . Heart disease Mother   . Osteoporosis Mother   . Diabetes Father   . Pancreatic cancer Father   . Colon cancer Unknown   . Bone cancer Sister   . Prostate cancer Brother   .  Colon cancer Brother   . Rectal cancer Sister   . Thyroid disease Sister        benign goiter resected    Social history:  reports that she has never smoked. She has never used smokeless tobacco. She reports that she does not drink alcohol or use drugs.    Allergies  Allergen Reactions  . Bystolic [Nebivolol Hcl] Other (See Comments)    "extreme weakness, heaviness in legs & arms, swelling in legs/arms/face, swollen abdomen, pain in bladder, feet pain, soreness in chest"  . Cholestyramine     "itching rash on stomach, bloated, nausea, vomiting, sleeplessness, extreme pain in arms"  . Hydrazine Yellow [Tartrazine] Other (See Comments)    "does not reduce high blood pressure, pain in arm, high pressure, felt like I was on verge of heart attack, really weak"  . Morphine Other (See Comments)    Feels morbid, weak, still in pain   . Niacin Palpitations    Fast heart beat  . Niaspan [Niacin Er] Palpitations and Other (See Comments)    "fast heart beat, high blood pressure"  . Norvasc [Amlodipine Besylate] Other (See Comments)    "extreme fluid retention/pain)  . Optivar [Azelastine Hcl] Photosensitivity  . Repaglinide Hives  . Sular [Nisoldipine Er] Other (See Comments)    "severe headaches, swelling eyes, hands, feet, shortness of breath, weak, flushed face, brain boiling, fluid retention, high blood sugar, nervous, heart fast beating"  . Tegretol [Carbamazepine] Other (See Comments)    Blood poisoning   . Telmisartan Other (See Comments)    "headache, difficulty urinating, high blood sugar, fluid retention"  . Cefdinir Swelling    Vaginal irritation, breathing,   . Clonidine Other (See Comments)    Dry mouth, fluid retention  . Clonidine Hydrochloride Other (See Comments)    Dry mouth, fluid retention  . Codeine Nausea And Vomiting  . Ezetimibe Other (See Comments)    Made weak  . Naproxen Other (See Comments)    Shrinks bladder  . Ziac [Bisoprolol-Hydrochlorothiazide] Other (See Comments)    "stopped urination"  . Elavil [Amitriptyline] Other (See Comments)    Gave Pt nightmares  . Keppra [Levetiracetam] Other (See Comments)    Shaking  . Lamictal [Lamotrigine]     itching  . Lyrica [Pregabalin] Swelling  . Topamax [Topiramate]     Dry eyes  . Ace Inhibitors Other (See Comments)    unknown  . Actonel [Risedronate Sodium] Other (See Comments)    unknown  . Amlodipine Besylate Other (See Comments)    unknown  . Aspirin Other (See Comments)    unknown  . Atacand [Candesartan] Other (See Comments)    unknown  . Bextra [Valdecoxib] Other (See Comments)    unknown  . Bisoprolol-Hydrochlorothiazide Other (See Comments)    unknown  . Candesartan Cilexetil Other (See Comments)    unknown  . Cefadroxil Other (See Comments)    unknown  . Celecoxib Rash  . Hydrocodone Other (See Comments)     unknown  . Hydrocodone-Acetaminophen Other (See Comments)    unknown  . Iodinated Diagnostic Agents Rash    "All over" "All over"  . Meloxicam Other (See Comments)    unknown  . Methylprednisolone Sodium Succinate Other (See Comments)    unknown  . Nabumetone Other (See Comments)    unknown  . Penicillins Other (See Comments)    unknown  . Pseudoephedrine-Guaifenesin Other (See Comments)    unknown  . Risedronate Sodium Other (See Comments)  unknown  . Rofecoxib Other (See Comments)    unknown  . Ru-Tuss [Chlorphen-Pse-Atrop-Hyos-Scop] Other (See Comments)    unknown  . Sulfonamide Derivatives Other (See Comments)    unknown  . Sulphur [Sulfur] Other (See Comments)    unknown  . Telithromycin Other (See Comments)    unknown  . Terfenadine Other (See Comments)    unknown  . Trandolapril-Verapamil Hcl Er Other (See Comments)    Headache, difficulty urinating, high blood sugar, fluid retention  Pt is taking Tarka (trandolapril-verapamil) currently, but requests the medication stay in her allergy list  . Trandolapril-Verapamil Hcl Er Other (See Comments)    Headache, difficulty urinating, high blood sugar, fluid retention  Pt is taking Tarka (trandolapril-verapamil) currently, but requests the medication stay in her allergy list  . Valium [Diazepam] Other (See Comments)    Makes her mean and hyper    Medications:  Prior to Admission medications   Medication Sig Start Date End Date Taking? Authorizing Provider  ALPRAZolam (XANAX) 0.25 MG tablet Take 1 tablet (0.25 mg total) by mouth at bedtime. 03/21/18  Yes Ward Givens, NP  amoxicillin-clavulanate (AUGMENTIN) 875-125 MG tablet Take 1 tablet by mouth 2 (two) times daily. 03/18/18  Yes [provider]  atorvastatin (LIPITOR) 20 MG tablet Take 1 tablet by mouth daily. 08/10/16  Yes [provider]  Biotin 1000 MCG tablet Take 1,000 mcg by mouth daily.    Yes [provider]  Blood Glucose  Monitoring Suppl (PRODIGY VOICE BLOOD GLUCOSE) w/Device KIT Use to check blood sugar 1 time per day. 10/18/15  Yes Renato Shin, MD  Cholecalciferol 5000 units capsule Take 10,000 Units by mouth daily.    Yes [provider]  colestipol (COLESTID) 1 G tablet Take 1-2 g by mouth 3 (three) times daily. Take 2 tablets in the morning, 1 tablet at noon, and 1 tablet in the evening. (per pt report)   Yes [provider]  cyanocobalamin (,VITAMIN B-12,) 1000 MCG/ML injection INJECT 1 ML INTRAMUSCULARLY EVERY 21 DAYS 06/18/16  Yes Hoyt Koch, MD  DiphenhydrAMINE HCl (BENADRYL ALLERGY PO) Take 1 tablet by mouth as needed.   Yes [provider]  escitalopram (LEXAPRO) 10 MG tablet Take 10 mg by mouth daily.   Yes [provider]  ferrous sulfate 325 (65 FE) MG tablet Take 325 mg by mouth every other day.   Yes [provider]  furosemide (LASIX) 20 MG tablet Take 1 tablet (20 mg total) by mouth daily. Do not take until I call you about labs. Patient taking differently: Take 20 mg by mouth as needed. Do not take until I call you about labs. 12/27/13  Yes Marin Olp, MD  glucose blood test strip Use to check blood sugar 1 time per day. 10/22/15  Yes Renato Shin, MD  insulin lispro (HUMALOG KWIKPEN) 100 UNIT/ML KiwkPen Inject 0.07 mLs (7 Units total) into the skin 3 (three) times daily with meals. And pen needles 1/day 05/24/17  Yes Renato Shin, MD  Insulin Pen Needle 32G X 4 MM MISC Used to inject insulin 3x daily 11/19/16  Yes Renato Shin, MD  levothyroxine (SYNTHROID, LEVOTHROID) 50 MCG tablet TAKE 1 TABLET DAILY BEFORE BREAKFAST 06/14/16  Yes Renato Shin, MD  lidocaine (LIDODERM) 5 % Place 1 patch onto the skin daily. Remove & Discard patch within 12 hours or as directed by MD Patient taking differently: Place 2 patches onto the skin daily as needed (pain). Remove & Discard patch within 12 hours  or as directed by MD 01/25/15  Yes Hoyt Koch, MD  phenytoin (DILANTIN) 100 MG ER capsule 2 capsules at bedtime and 1 in the morning 11/17/17  Yes Millikan, Megan, NP  PHENYTOIN INFATABS 50 MG tablet CHEW 1 TABLET DAILY Patient taking differently: CHEW 1 TABLET EVERY MORNING. 06/08/17  Yes Kathrynn Ducking, MD  Polyethyl Glycol-Propyl Glycol 0.4-0.3 % SOLN Place 2 drops into both eyes at bedtime.    Yes [provider]  predniSONE (STERAPRED UNI-PAK 48 TAB) 10 MG (48) TBPK tablet Take 10 mg by mouth 2 (two) times daily. 03/18/18  Yes [provider]  primidone (MYSOLINE) 50 MG tablet Two tablets in the morning and 1 in the evening 03/28/18  Yes Kathrynn Ducking, MD  traMADol (ULTRAM) 50 MG tablet Take 50 mg by mouth every 6 (six) hours as needed.    Yes [provider]  trandolapril-verapamil (TARKA) 2-240 MG tablet Take 1 tablet by mouth daily.   Yes [provider]  ranitidine (ZANTAC) 150 MG capsule Take 150 mg by mouth daily as needed for heartburn.     [provider]    ROS:  Out of a complete 14 system review of symptoms, the patient complains only of the following symptoms, and all other reviewed systems are negative.  Fatigue Eye itching, double vision, loss of vision Cough Excessive thirst Diarrhea Back pain, walking difficulty Itching Memory loss, weakness, tremors Anxiety  Blood pressure 138/80, pulse 65, height 5' 7.5" (1.715 m), weight 144 lb (65.3 kg).  Physical Exam  General: The patient is alert and cooperative at the time of the examination.  Skin: No significant peripheral edema is noted.   Neurologic Exam  Mental status: The patient is alert and oriented x 3 at the time of the examination. The patient has apparent normal recent and remote memory, with an apparently normal attention span and concentration ability.  Mini-Mental status examination done today shows a total score 29/30.   Cranial nerves: Facial symmetry is present. Speech is normal, no  aphasia or dysarthria is noted. Extraocular movements are full. Visual fields are full.  Motor: The patient has good strength in all 4 extremities.  Sensory examination: Soft touch sensation is symmetric on the face, arms, and legs.  Coordination: The patient has good finger-nose-finger and heel-to-shin bilaterally.  Mild tremors are noted with finger-nose-finger bilaterally.  Gait and station: The patient has a minimally wide-based gait, the patient [place] have some slight instability with turns.  The patient is able to walk independently.  Tandem gait was not performed.  Romberg is negative. No drift is seen.  Reflexes: Deep tendon reflexes are symmetric.   Assessment/Plan:  1.  Essential tremor  2.  Mild memory disturbance  3.  Gait disturbance  4.  History of seizures  The patient does not wish to undergo physical therapy.  The patient will go up slightly on the Mysoline taking 100 mg in the morning and 50 mg in the evening.  The patient will follow-up in 6 months.  She will contact me if any new issues arise.  A prescription was sent in for the Mysoline.  The memory issues will be followed over time.  Jill Alexanders MD 03/28/2018 12:37 PM  [facility] [address] [place], [place] [address]  Phone [phone] Fax [phone]

## 2018-04-04 ENCOUNTER — Telehealth: Payer: Self-pay | Admitting: Neurology

## 2018-04-04 MED ORDER — ALPRAZOLAM 0.25 MG PO TABS: 0.2500 mg | ORAL_TABLET | Freq: Every day | ORAL | 1 refills | Status: DC

## 2018-04-04 NOTE — Addendum Note (Signed)
Addended by: Kathrynn Ducking on: 04/04/2018 10:14 AM   Modules accepted: Orders

## 2018-04-04 NOTE — Telephone Encounter (Signed)
Pt is asking for her refill of ALPRAZolam (XANAX) 0.25 MG tablet, pt states she has been without it for 3-4 weeks, pt is asking if 30 days worth can be sent to  [facility] [phone] and the remainder be sent to [place] states she has been using benadryl to replace the medication but she is not sleeping well.  Pt states since she has been told she can take for anxiety during the day she is asking for more than a 90 day amount, please call

## 2018-04-04 NOTE — Telephone Encounter (Signed)
A prescription for alprazolam will be given.

## 2018-04-04 NOTE — Telephone Encounter (Signed)
I spoke with pt.  She would like entire rx. sent to [facility]. Rx. faxed to [facility] as requested/fim

## 2018-04-05 DIAGNOSIS — M546 Pain in thoracic spine: Secondary | ICD-10-CM | POA: Diagnosis not present

## 2018-04-05 DIAGNOSIS — I10 Essential (primary) hypertension: Secondary | ICD-10-CM | POA: Diagnosis not present

## 2018-04-05 DIAGNOSIS — M545 Low back pain: Secondary | ICD-10-CM | POA: Diagnosis not present

## 2018-04-11 ENCOUNTER — Telehealth: Payer: Self-pay | Admitting: *Deleted

## 2018-04-11 ENCOUNTER — Ambulatory Visit
Admission: RE | Admit: 2018-04-11 | Discharge: 2018-04-11 | Disposition: A | Discharge status: Home / Self Care | Payer: Medicare Other | Source: Ambulatory Visit | Attending: Family Medicine | Admitting: Family Medicine

## 2018-04-11 DIAGNOSIS — Z853 Personal history of malignant neoplasm of breast: Secondary | ICD-10-CM

## 2018-04-11 DIAGNOSIS — R922 Inconclusive mammogram: Secondary | ICD-10-CM | POA: Diagnosis not present

## 2018-04-11 MED ORDER — ALPRAZOLAM 0.25 MG PO TABS: 0.2500 mg | ORAL_TABLET | Freq: Three times a day (TID) | ORAL | 1 refills | Status: DC | PRN

## 2018-04-11 NOTE — Addendum Note (Signed)
Addended by: Kathrynn Ducking on: 04/11/2018 05:12 PM   Modules accepted: Orders

## 2018-04-11 NOTE — Telephone Encounter (Signed)
I called the patient.  The patient will be receiving her new alprazolam prescription tomorrow, I will change the prescription in the computer, whenever she needs a refill, she is to contact our office so I can send in the new prescription.

## 2018-04-11 NOTE — Telephone Encounter (Addendum)
I contacted the patient. Patient is scheduled to receive her Xanax 0.25 mg 1 tablet at bedtime refill from 04/04/2018 [facility] tomorrow . Patient wanted to know if it would be ok to start 1 tablet prn for anxiety during the day for panic attacks along with the daily night time dosage once she receives the prescription tomorrow? Please advise

## 2018-04-11 NOTE — Telephone Encounter (Signed)
Pt has a question about rx alprazolam. please call.  [phone]

## 2018-04-25 ENCOUNTER — Encounter: Payer: Self-pay | Admitting: Podiatry

## 2018-04-25 ENCOUNTER — Ambulatory Visit: Payer: Medicare Other | Admitting: Podiatry

## 2018-04-25 ENCOUNTER — Ambulatory Visit (INDEPENDENT_AMBULATORY_CARE_PROVIDER_SITE_OTHER): Payer: Medicare Other | Admitting: Podiatry

## 2018-04-25 DIAGNOSIS — M79674 Pain in right toe(s): Secondary | ICD-10-CM | POA: Diagnosis not present

## 2018-04-25 DIAGNOSIS — E039 Hypothyroidism, unspecified: Secondary | ICD-10-CM | POA: Diagnosis not present

## 2018-04-25 DIAGNOSIS — L84 Corns and callosities: Secondary | ICD-10-CM | POA: Diagnosis not present

## 2018-04-25 DIAGNOSIS — M79675 Pain in left toe(s): Secondary | ICD-10-CM

## 2018-04-25 DIAGNOSIS — Q828 Other specified congenital malformations of skin: Secondary | ICD-10-CM

## 2018-04-25 DIAGNOSIS — E1165 Type 2 diabetes mellitus with hyperglycemia: Secondary | ICD-10-CM | POA: Diagnosis not present

## 2018-04-25 DIAGNOSIS — B351 Tinea unguium: Secondary | ICD-10-CM

## 2018-04-25 DIAGNOSIS — Q782 Osteopetrosis: Secondary | ICD-10-CM | POA: Diagnosis not present

## 2018-04-25 DIAGNOSIS — E1142 Type 2 diabetes mellitus with diabetic polyneuropathy: Secondary | ICD-10-CM

## 2018-04-25 DIAGNOSIS — Z794 Long term (current) use of insulin: Secondary | ICD-10-CM | POA: Diagnosis not present

## 2018-04-25 NOTE — Patient Instructions (Addendum)
Corns and Calluses Corns are small areas of thickened skin that occur on the top, sides, or tip of a toe. They contain a cone-shaped core with a point that can press on a nerve below. This causes pain.  Calluses are areas of thickened skin that can occur anywhere on the body, including the hands, fingers, palms, soles of the feet, and heels. Calluses are usually larger than corns. What are the causes? Corns and calluses are caused by rubbing (friction) or pressure, such as from shoes that are too tight or do not fit properly. What increases the risk? Corns are more likely to develop in people who have misshapen toes (toe deformities), such as hammer toes. Calluses can occur with friction to any area of the skin. They are more likely to develop in people who:  Work with their hands.  Wear shoes that fit poorly, are too tight, or are high-heeled.  Have toe deformities. What are the signs or symptoms? Symptoms of a corn or callus include:  A hard growth on the skin.  Pain or tenderness under the skin.  Redness and swelling.  Increased discomfort while wearing tight-fitting shoes, if your feet are affected. If a corn or callus becomes infected, symptoms Dhondt include:  Redness and swelling that gets worse.  Pain.  Fluid, blood, or pus draining from the corn or callus. How is this diagnosed? Corns and calluses [place] be diagnosed based on your symptoms, your medical history, and a physical exam. How is this treated? Treatment for corns and calluses Stuck include:  Removing the cause of the friction or pressure. This Turi involve: ? Changing your shoes. ? Wearing shoe inserts (orthotics) or other protective layers in your shoes, such as a corn pad. ? Wearing gloves.  Applying medicine to the skin (topical medicine) to help soften skin in the hardened, thickened areas.  Removing layers of dead skin with a file to reduce the size of the corn or callus.  Removing the corn or callus with a  scalpel or laser.  Taking antibiotic medicines, if your corn or callus is infected.  Having surgery, if a toe deformity is the cause. Follow these instructions at home:   Take over-the-counter and prescription medicines only as told by your health care provider.  If you were prescribed an antibiotic, take it as told by your health care provider. Do not stop taking it even if your condition starts to improve.  Wear shoes that fit well. Avoid wearing high-heeled shoes and shoes that are too tight or too loose.  Wear any padding, protective layers, gloves, or orthotics as told by your health care provider.  Soak your hands or feet and then use a file or pumice stone to soften your corn or callus. Do this as told by your health care provider.  Check your corn or callus every day for symptoms of infection. Contact a health care provider if you:  Notice that your symptoms do not improve with treatment.  Have redness or swelling that gets worse.  Notice that your corn or callus becomes painful.  Have fluid, blood, or pus coming from your corn or callus.  Have new symptoms. Summary  Corns are small areas of thickened skin that occur on the top, sides, or tip of a toe.  Calluses are areas of thickened skin that can occur anywhere on the body, including the hands, fingers, palms, and soles of the feet. Calluses are usually larger than corns.  Corns and calluses are caused by   rubbing (friction) or pressure, such as from shoes that are too tight or do not fit properly.  Treatment [place] include wearing any padding, protective layers, gloves, or orthotics as told by your health care provider. This information is not intended to replace advice given to you by your health care provider. Make sure you discuss any questions you have with your health care provider. Document Released: 01/25/2004 Document Revised: 03/03/2017 Document Reviewed: 03/03/2017 Elsevier Interactive Patient Education   2019 Elsevier Inc.  Onychomycosis/Fungal Toenails  WHAT IS IT? An infection that lies within the keratin of your nail plate that is caused by a fungus.  WHY ME? Fungal infections affect all ages, sexes, races, and creeds.  There Hedges be many factors that predispose you to a fungal infection such as age, coexisting medical conditions such as diabetes, or an autoimmune disease; stress, medications, fatigue, genetics, etc.  Bottom line: fungus thrives in a warm, moist environment and your shoes offer such a location.  IS IT CONTAGIOUS? Theoretically, yes.  You do not want to share shoes, nail clippers or files with someone who has fungal toenails.  Walking around barefoot in the same room or sleeping in the same bed is unlikely to transfer the organism.  It is important to realize, however, that fungus can spread easily from one nail to the next on the same foot.  HOW DO WE TREAT THIS?  There are several ways to treat this condition.  Treatment S[place] depend on many factors such as age, medications, pregnancy, liver and kidney conditions, etc.  It is best to ask your doctor which options are available to you.  1. No treatment.   Unlike many other medical concerns, you can live with this condition.  However for many people this can be a painful condition and Gutmann lead to ingrown toenails or a bacterial infection.  It is recommended that you keep the nails cut short to help reduce the amount of fungal nail. 2. Topical treatment.  These range from herbal remedies to prescription strength nail lacquers.  About 40-50% effective, topicals require twice daily application for approximately 9 to 12 months or until an entirely new nail has grown out.  The most effective topicals are medical grade medications available through physicians offices. 3. Oral antifungal medications.  With an 80-90% cure rate, the most common oral medication requires 3 to 4 months of therapy and stays in your system for a year as the new nail  grows out.  Oral antifungal medications do require blood work to make sure it is a safe drug for you.  A liver function panel will be performed prior to starting the medication and after the first month of treatment.  It is important to have the blood work performed to avoid any harmful side effects.  In general, this medication safe but blood work is required. 4. Laser Therapy.  This treatment is performed by applying a specialized laser to the affected nail plate.  This therapy is noninvasive, fast, and non-painful.  It is not covered by insurance and is therefore, out of pocket.  The results have been very good with a 80-95% cure rate.  The [facility] is the only practice in the area to offer this therapy. 5. Permanent Nail Avulsion.  Removing the entire nail so that a new nail will not grow back. Diabetes Mellitus and Foot Care Foot care is an important part of your health, especially when you have diabetes. Diabetes [place] cause you to have problems   because of poor blood flow (circulation) to your feet and legs, which can cause your skin to:  Become thinner and drier.  Break more easily.  Heal more slowly.  Peel and crack. You Marmo also have nerve damage (neuropathy) in your legs and feet, causing decreased feeling in them. This means that you [place] not notice minor injuries to your feet that could lead to more serious problems. Noticing and addressing any potential problems early is the best way to prevent future foot problems. How to care for your feet Foot hygiene  Wash your feet daily with warm water and mild soap. Do not use hot water. Then, pat your feet and the areas between your toes until they are completely dry. Do not soak your feet as this can dry your skin.  Trim your toenails straight across. Do not dig under them or around the cuticle. File the edges of your nails with an emery board or nail file.  Apply a moisturizing lotion or petroleum jelly to the skin on your feet and  to dry, brittle toenails. Use lotion that does not contain alcohol and is unscented. Do not apply lotion between your toes. Shoes and socks  Wear clean socks or stockings every day. Make sure they are not too tight. Do not wear knee-high stockings since they Youngers decrease blood flow to your legs.  Wear shoes that fit properly and have enough cushioning. Always look in your shoes before you put them on to be sure there are no objects inside.  To break in new shoes, wear them for just a few hours a day. This prevents injuries on your feet. Wounds, scrapes, corns, and calluses  Check your feet daily for blisters, cuts, bruises, sores, and redness. If you cannot see the bottom of your feet, use a mirror or ask someone for help.  Do not cut corns or calluses or try to remove them with medicine.  If you find a minor scrape, cut, or break in the skin on your feet, keep it and the skin around it clean and dry. You Fricker clean these areas with mild soap and water. Do not clean the area with peroxide, alcohol, or iodine.  If you have a wound, scrape, corn, or callus on your foot, look at it several times a day to make sure it is healing and not infected. Check for: ? Redness, swelling, or pain. ? Fluid or blood. ? Warmth. ? Pus or a bad smell. General instructions  Do not cross your legs. This Gervacio decrease blood flow to your feet.  Do not use heating pads or hot water bottles on your feet. They Smead burn your skin. If you have lost feeling in your feet or legs, you Ci[place]o not know this is happening until it is too late.  Protect your feet from hot and cold by wearing shoes, such as at the beach or on hot pavement.  Schedule a complete foot exam at least once a year (annually) or more often if you have foot problems. If you have foot problems, report any cuts, sores, or bruises to your health care provider immediately. Contact a health care provider if:  You have a medical condition that increases your  risk of infection and you have any cuts, sores, or bruises on your feet.  You have an injury that is not healing.  You have redness on your legs or feet.  You feel burning or tingling in your legs or feet.  You have pain   or cramps in your legs and feet.  Your legs or feet are numb.  Your feet always feel cold.  You have pain around a toenail. Get help right away if:  You have a wound, scrape, corn, or callus on your foot and: ? You have pain, swelling, or redness that gets worse. ? You have fluid or blood coming from the wound, scrape, corn, or callus. ? Your wound, scrape, corn, or callus feels warm to the touch. ? You have pus or a bad smell coming from the wound, scrape, corn, or callus. ? You have a fever. ? You have a red line going up your leg. Summary  Check your feet every day for cuts, sores, red spots, swelling, and blisters.  Moisturize feet and legs daily.  Wear shoes that fit properly and have enough cushioning.  If you have foot problems, report any cuts, sores, or bruises to your health care provider immediately.  Schedule a complete foot exam at least once a year (annually) or more often if you have foot problems. This information is not intended to replace advice given to you by your health care provider. Make sure you discuss any questions you have with your health care provider. Document Released: 04/17/2000 Document Revised: 06/02/2017 Document Reviewed: 05/22/2016 Elsevier Interactive Patient Education  2019 Elsevier Inc.  

## 2018-04-25 NOTE — Progress Notes (Signed)
Subjective: [name] presents with diabetes, diabetic neuropathy and cc of painful, discolored, thick toenails and painful calluses/corns which interfere with activities of daily living. Pain is aggravated when wearing enclosed shoe gear. Pain is relieved with periodic professional debridement.  She notes she must come as soon as her eligiblity allows due to callus and corn on left foot, both which bother her. She states her silicone toe pad is worn and she's requesting a new one on today.  [name], MD is her PCP.   Current Outpatient Medications:  .  ALPRAZolam (XANAX) 0.25 MG tablet, Take 1 tablet (0.25 mg total) by mouth 3 (three) times daily as needed for anxiety., Disp: 180 tablet, Rfl: 1 .  amoxicillin-clavulanate (AUGMENTIN) 875-125 MG tablet, Take 1 tablet by mouth 2 (two) times daily., Disp: , Rfl: 0 .  atorvastatin (LIPITOR) 20 MG tablet, Take 1 tablet by mouth daily., Disp: , Rfl:  .  Biotin 1000 MCG tablet, Take 1,000 mcg by mouth daily. , Disp: , Rfl:  .  Blood Glucose Monitoring Suppl (PRODIGY VOICE BLOOD GLUCOSE) w/Device KIT, Use to check blood sugar 1 time per day., Disp: 1 each, Rfl: 2 .  Cholecalciferol 5000 units capsule, Take 10,000 Units by mouth daily. , Disp: , Rfl:  .  colestipol (COLESTID) 1 G tablet, Take 1-2 g by mouth 3 (three) times daily. Take 2 tablets in the morning, 1 tablet at noon, and 1 tablet in the evening. (per pt report), Disp: , Rfl:  .  cyanocobalamin (,VITAMIN B-12,) 1000 MCG/ML injection, INJECT 1 ML INTRAMUSCULARLY EVERY 21 DAYS, Disp: 3 mL, Rfl: 2 .  DiphenhydrAMINE HCl (BENADRYL ALLERGY PO), Take 1 tablet by mouth as needed., Disp: , Rfl:  .  escitalopram (LEXAPRO) 10 MG tablet, Take 10 mg by mouth daily., Disp: , Rfl:  .  ferrous sulfate 325 (65 FE) MG tablet, Take 325 mg by mouth every other day., Disp: , Rfl:  .  furosemide (LASIX) 20 MG tablet, Take 1 tablet (20 mg total) by mouth daily. Do not take until I call you about labs.  (Patient taking differently: Take 20 mg by mouth as needed. Do not take until I call you about labs.), Disp: 30 tablet, Rfl: 0 .  glucose blood test strip, Use to check blood sugar 1 time per day., Disp: 100 each, Rfl: 3 .  insulin lispro (HUMALOG KWIKPEN) 100 UNIT/ML KiwkPen, Inject 0.07 mLs (7 Units total) into the skin 3 (three) times daily with meals. And pen needles 1/day, Disp: 15 pen, Rfl: 3 .  Insulin Pen Needle 32G X 4 MM MISC, Used to inject insulin 3x daily, Disp: 270 each, Rfl: 2 .  levothyroxine (SYNTHROID, LEVOTHROID) 50 MCG tablet, TAKE 1 TABLET DAILY BEFORE BREAKFAST, Disp: 90 tablet, Rfl: 1 .  lidocaine (LIDODERM) 5 %, Place 1 patch onto the skin daily. Remove & Discard patch within 12 hours or as directed by MD (Patient taking differently: Place 2 patches onto the skin daily as needed (pain). Remove & Discard patch within 12 hours or as directed by MD), Disp: 90 patch, Rfl: 1 .  phenytoin (DILANTIN) 100 MG ER capsule, 2 capsules at bedtime and 1 in the morning, Disp: 270 capsule, Rfl: 1 .  PHENYTOIN INFATABS 50 MG tablet, CHEW 1 TABLET DAILY (Patient taking differently: CHEW 1 TABLET EVERY MORNING.), Disp: 90 tablet, Rfl: 3 .  Polyethyl Glycol-Propyl Glycol 0.4-0.3 % SOLN, Place 2 drops into both eyes at bedtime. , Disp: , Rfl:  .  predniSONE (STERAPRED UNI-PAK 48 TAB) 10 MG (48) TBPK tablet, Take 10 mg by mouth 2 (two) times daily., Disp: , Rfl: 0 .  primidone (MYSOLINE) 50 MG tablet, Two tablets in the morning and 1 in the evening, Disp: 270 tablet, Rfl: 1 .  ranitidine (ZANTAC) 150 MG capsule, Take 150 mg by mouth daily as needed for heartburn. , Disp: , Rfl:  .  traMADol (ULTRAM) 50 MG tablet, Take 50 mg by mouth every 6 (six) hours as needed. , Disp: , Rfl:  .  trandolapril-verapamil (TARKA) 2-240 MG tablet, Take 1 tablet by mouth daily., Disp: , Rfl:   Allergies  Allergen Reactions  . Bystolic [Nebivolol Hcl] Other (See Comments)    "extreme weakness, heaviness in legs &  arms, swelling in legs/arms/face, swollen abdomen, pain in bladder, feet pain, soreness in chest"  . Cholestyramine     "itching rash on stomach, bloated, nausea, vomiting, sleeplessness, extreme pain in arms"  . Hydrazine Yellow [Tartrazine] Other (See Comments)    "does not reduce high blood pressure, pain in arm, high pressure, felt like I was on verge of heart attack, really weak"  . Morphine Other (See Comments)    Feels morbid, weak, still in pain  . Niacin Palpitations    Fast heart beat  . Niaspan [Niacin Er] Palpitations and Other (See Comments)    "fast heart beat, high blood pressure"  . Norvasc [Amlodipine Besylate] Other (See Comments)    "extreme fluid retention/pain)  . Optivar [Azelastine Hcl] Photosensitivity  . Repaglinide Hives  . Sular [Nisoldipine Er] Other (See Comments)    "severe headaches, swelling eyes, hands, feet, shortness of breath, weak, flushed face, brain boiling, fluid retention, high blood sugar, nervous, heart fast beating"  . Tegretol [Carbamazepine] Other (See Comments)    Blood poisoning   . Telmisartan Other (See Comments)    "headache, difficulty urinating, high blood sugar, fluid retention"  . Cefdinir Swelling    Vaginal irritation, breathing,   . Clonidine Other (See Comments)    Dry mouth, fluid retention  . Clonidine Hydrochloride Other (See Comments)    Dry mouth, fluid retention  . Codeine Nausea And Vomiting  . Ezetimibe Other (See Comments)    Made weak  . Naproxen Other (See Comments)    Shrinks bladder  . Ziac [Bisoprolol-Hydrochlorothiazide] Other (See Comments)    "stopped urination"  . Elavil [Amitriptyline] Other (See Comments)    Gave Pt nightmares  . Keppra [Levetiracetam] Other (See Comments)    Shaking  . Lamictal [Lamotrigine]     itching  . Lyrica [Pregabalin] Swelling  . Topamax [Topiramate]     Dry eyes  . Ace Inhibitors Other (See Comments)    unknown  . Actonel [Risedronate Sodium] Other (See Comments)     unknown  . Amlodipine Besylate Other (See Comments)    unknown  . Aspirin Other (See Comments)    unknown  . Atacand [Candesartan] Other (See Comments)    unknown  . Bextra [Valdecoxib] Other (See Comments)    unknown  . Bisoprolol-Hydrochlorothiazide Other (See Comments)    unknown  . Candesartan Cilexetil Other (See Comments)    unknown  . Cefadroxil Other (See Comments)    unknown  . Celecoxib Rash  . Hydrocodone Other (See Comments)    unknown  . Hydrocodone-Acetaminophen Other (See Comments)    unknown  . Iodinated Diagnostic Agents Rash    "All over" "All over"  . Meloxicam Other (See Comments)    unknown  .  Methylprednisolone Sodium Succinate Other (See Comments)    unknown  . Nabumetone Other (See Comments)    unknown  . Penicillins Other (See Comments)    unknown  . Pseudoephedrine-Guaifenesin Other (See Comments)    unknown  . Risedronate Sodium Other (See Comments)    unknown  . Rofecoxib Other (See Comments)    unknown  . Ru-Tuss [Chlorphen-Pse-Atrop-Hyos-Scop] Other (See Comments)    unknown  . Sulfonamide Derivatives Other (See Comments)    unknown  . Sulphur [Sulfur] Other (See Comments)    unknown  . Telithromycin Other (See Comments)    unknown  . Terfenadine Other (See Comments)    unknown  . Trandolapril-Verapamil Hcl Er Other (See Comments)    Headache, difficulty urinating, high blood sugar, fluid retention  Pt is taking Tarka (trandolapril-verapamil) currently, but requests the medication stay in her allergy list  . Trandolapril-Verapamil Hcl Er Other (See Comments)    Headache, difficulty urinating, high blood sugar, fluid retention  Pt is taking Tarka (trandolapril-verapamil) currently, but requests the medication stay in her allergy list  . Valium [Diazepam] Other (See Comments)    Makes her mean and hyper    Vascular Examination: Capillary refill time <3 seconds x 10 digits Dorsalis pedis and Posterior tibial pulses 1/4 b/l No  digital hair x 10 digits Skin temperature gradient WNL b/l  Dermatological Examination: Skin with normal turgor, texture and tone b/l  Toenails 1-5 b/l discolored, thick, dystrophic with subungual debris and pain with palpation to nailbeds due to thickness of nails.  Porokeratotic lesion submet head 1 b/l with visible subdermal hemorrhage noted on left foot lesion. No flocculence, no erythema, no edema, no drainage.   Hemorrhagic hyperkeratotic lesion noted distal tip of left 2nd digit and  dorsal PIPJ of left 2nd digit. No erythema, no edema, no drainage, no flocculence.   Musculoskeletal: Muscle strength 5/5 to all LE muscle groups  Rigid hammertoe deformity b/l 2nd digits  Semirigid hammertoe deformity digits 3-5 b/l  Neurological: Sensation diminished with 10 gram monofilament.   Assessment: 1. Painful onychomycosis toenails 1-5 b/l 2. Porokeratosis submet head 1 b/l 3. Corn distal tip left 2nd digit, dorsal PIPJ left 2nd digit 4. NIDDM with Diabetic neuropathy  Plan: 1. Continue diabetic foot care principles. Literature dispensed 2. Toenails 1-5 b/l were debrided in length and girth without iatrogenic bleeding. 3. Porokeratotic lesions  pared with sterile chisel blade. Corns debrided left 2nd digit. 4. Patient to continue soft, supportive shoe gear 5. Patient to report any pedal injuries to medical professional  6. Follow up 3 months. Patient/POA to call should there be a concern in the interim.

## 2018-04-29 ENCOUNTER — Ambulatory Visit: Payer: Self-pay | Admitting: *Deleted

## 2018-05-02 ENCOUNTER — Other Ambulatory Visit: Payer: Self-pay | Admitting: *Deleted

## 2018-05-02 NOTE — Patient Outreach (Signed)
[facility]) Care Management  05/02/2018  Tanyla Stege Lords 11-12-1929 [id number]   RN Health Coach Monthly Outreach  Referral Date:09/06/2017 Referral Source:Transfer from [facility] Nurse Reason for Referral:Continued Disease Management Education Insurance:Medicare   Outreach Attempt:  Outreach attempt #1 to patient for quarterly follow up. No answer. RN Health Coach left HIPAA compliant voicemail message along with contact information.  Plan:  RN Health Coach will make another outreach attempt within the month of January.  [place] [phone] [email or web address]

## 2018-05-18 ENCOUNTER — Other Ambulatory Visit: Payer: Self-pay | Admitting: *Deleted

## 2018-05-18 NOTE — Patient Outreach (Signed)
[facility]) Care Management  05/18/2018  Vanilla Heatherington Ney [address] 31, 1931 [id number]   RN Health Coach Quarterly Outreach  Referral Date:09/06/2017 Referral Source:Transfer from [facility] Nurse Reason for Referral:Continued Disease Management Education Insurance:Medicare   Outreach Attempt:  Outreach attempt #2 to patient for quarterly follow up. No answer. RN Health Coach left HIPAA compliant voicemail message along with contact information.  Plan:  RN Health Coach will make another outreach attempt within the month of February.  [place] [phone] [email or web address]

## 2018-06-03 DIAGNOSIS — N390 Urinary tract infection, site not specified: Secondary | ICD-10-CM | POA: Diagnosis not present

## 2018-06-15 ENCOUNTER — Encounter: Payer: Self-pay | Admitting: Podiatry

## 2018-06-15 ENCOUNTER — Encounter: Payer: Self-pay | Admitting: *Deleted

## 2018-06-15 ENCOUNTER — Other Ambulatory Visit: Payer: Self-pay | Admitting: *Deleted

## 2018-06-15 NOTE — Patient Outreach (Signed)
[place] [facility]) Care Management  [place]  06/16/2018   [name] Jun 12, 1929 [id number]    [place] Quarterly Outreach   Referral Date:  09/06/2017 Referral Source:  Transfer from [place] Reason for Referral:  Continued Disease Management Education Insurance:  Medicare   Outreach Attempt:  Successful telephone outreach to patient for quarterly follow up.  HIPAA verified with patient.  Patient stating she is doing much better; just getting over an urinary tract infection.  [facility] treatment for her symptoms.  Has completed her prescribed antibiotics and feels the urinary tract infection is completely resolved.  Patient states she has completed her round of steroids also for her intense headaches and since this her blood sugars have been better controlled.  Latest Hgb A1C is slightly increased to 7.3 on 04/25/2018 (goal is 7-7.5 per physician notes).  Had not checked blood sugar today until speaking with RN Health Coach.  Blood sugar 147 after eating supper.  States her blood sugars range in 100-140's at various times of the day.  Patient reports receiving assistance from various family members throughout the day to assist her with care at home.  Does report fall last month trying to sit down but missing the chair.  Fall precautions and preventions reviewed and discussed.  Encounter Medications:  Outpatient Encounter Medications as of 06/15/2018  Medication Sig Note  . ALPRAZolam (XANAX) 0.25 MG tablet Take 1 tablet (0.25 mg total) by mouth 3 (three) times daily as needed for anxiety.   Marland Kitchen atorvastatin (LIPITOR) 20 MG tablet Take 1 tablet by mouth daily. 10/07/2017: Patient stated she is taking medication now  . Biotin 1000 MCG tablet Take 1,000 mcg by mouth daily.    . Blood Glucose Monitoring Suppl (PRODIGY VOICE BLOOD GLUCOSE) w/Device KIT Use to check blood sugar 1 time per day.   . Cholecalciferol  5000 units capsule Take 10,000 Units by mouth daily.  07/20/2017: Pt reports she was told to take 2 tablets daily b/c she had low Vit D.  . colestipol (COLESTID) 1 G tablet Take 1-2 g by mouth 3 (three) times daily. Take 2 tablets in the morning, 1 tablet at noon, and 1 tablet in the evening. (per pt report) 10/07/2017: Patient reports taking 2 tablets in morning and 2 tablets at night  . cyanocobalamin (,VITAMIN B-12,) 1000 MCG/ML injection INJECT 1 ML INTRAMUSCULARLY EVERY 21 DAYS   . DiphenhydrAMINE HCl (BENADRYL ALLERGY PO) Take 1 tablet by mouth as needed.   Marland Kitchen escitalopram (LEXAPRO) 10 MG tablet Take 10 mg by mouth daily. 01/25/2018: Taking 10 mg daily  . ferrous sulfate 325 (65 FE) MG tablet Take 325 mg by mouth every other day.   . furosemide (LASIX) 20 MG tablet Take 1 tablet (20 mg total) by mouth daily. Do not take until I call you about labs. (Patient taking differently: Take 20 mg by mouth as needed. Do not take until I call you about labs.) 07/19/2014: .  . glucose blood test strip Use to check blood sugar 1 time per day.   . insulin lispro (HUMALOG KWIKPEN) 100 UNIT/ML KiwkPen Inject 0.07 mLs (7 Units total) into the skin 3 (three) times daily with meals. And pen needles 1/day 06/15/2018: Dose decreased to 7 units three times a day  . Insulin Pen Needle 32G X 4 MM MISC Used to inject insulin 3x daily   . levothyroxine (SYNTHROID, LEVOTHROID) 50 MCG tablet  TAKE 1 TABLET DAILY BEFORE BREAKFAST 01/25/2018: Reports taking 75 mcg daily  . lidocaine (LIDODERM) 5 % Place 1 patch onto the skin daily. Remove & Discard patch within 12 hours or as directed by MD (Patient taking differently: Place 2 patches onto the skin daily as needed (pain). Remove & Discard patch within 12 hours or as directed by MD)   . phenytoin (DILANTIN) 100 MG ER capsule 2 capsules at bedtime and 1 in the morning   . PHENYTOIN INFATABS 50 MG tablet CHEW 1 TABLET DAILY (Patient taking differently: CHEW 1 TABLET EVERY MORNING.)  08/31/2017: 08/31/17 taking 1 capsule in AM, 2 caps at bedtime  . Polyethyl Glycol-Propyl Glycol 0.4-0.3 % SOLN Place 2 drops into both eyes at bedtime.  08/31/2017: Applies in Am and PM  . primidone (MYSOLINE) 50 MG tablet Two tablets in the morning and 1 in the evening   . traMADol (ULTRAM) 50 MG tablet Take 50 mg by mouth every 6 (six) hours as needed.    . trandolapril-verapamil (TARKA) 2-240 MG tablet Take 1 tablet by mouth daily.   Marland Kitchen amoxicillin-clavulanate (AUGMENTIN) 875-125 MG tablet Take 1 tablet by mouth 2 (two) times daily. 06/15/2018: completed  . predniSONE (STERAPRED UNI-PAK 48 TAB) 10 MG (48) TBPK tablet Take 10 mg by mouth 2 (two) times daily. 06/15/2018: completed  . ranitidine (ZANTAC) 150 MG capsule Take 150 mg by mouth daily as needed for heartburn.     No facility-administered encounter medications on file as of 06/15/2018.     Functional Status:  In your present state of health, do you have any difficulty performing the following activities: 10/07/2017 07/19/2017  Hearing? N N  Vision? Y N  Comment double vision and some difficulty seeing -  Difficulty concentrating or making decisions? Y N  Walking or climbing stairs? Y Y  Dressing or bathing? N N  Doing errands, shopping? [name]  Preparing Food and eating ? Y Y  Using the Toilet? N N  In the past six months, have you accidently leaked urine? N Y  Do you have problems with loss of bowel control? Y N  Managing your Medications? Y N  Managing your Finances? N N  Housekeeping or managing your Housekeeping? Y Y  Some recent data might be hidden    Fall/Depression Screening: Fall Risk  06/15/2018 01/25/2018 12/23/2017  Falls in the past year? 1 Yes Yes  Comment Fell in January trying to sit in chair no fall since 12/17/2017 Fall 12/17/2017 down stairs  Number falls in past yr: 1 2 or more 2 or more  Comment - - -  Injury with Fall? 1 Yes Yes  Comment - - -  Risk Factor Category  High Risk (2 or more Points) High Fall Risk  High Fall Risk  Risk for fall due to : History of fall(s);Impaired vision;Medication side effect;Impaired balance/gait;Impaired mobility History of fall(s);Impaired balance/gait;Medication side effect;Impaired mobility;Impaired vision Impaired balance/gait;Impaired mobility;Medication side effect;Impaired vision;History of fall(s)  Risk for fall due to: Comment - - -  Follow up Falls evaluation completed;Falls prevention discussed;Education provided Falls prevention discussed;Education provided Falls evaluation completed;Education provided;Falls prevention discussed;Follow up appointment   PHQ 2/9 Scores 10/07/2017 07/12/2017 07/01/2016 05/27/2016 05/13/2016 02/06/2013 11/19/2011  PHQ - 2 Score 0 0 0 0 0 0 0   THN CM Care Plan Problem One     Most Recent Value  Care Plan Problem One  Knowledge deficiet related to self management of falls and diabetes  Role Documenting the Problem  One  [place] for Problem One  Active  THN Long Term Goal   Patient will maintain Hgb A1C below 7.5 in the next 90 days,  THN Long Term Goal Start Date  06/15/18  Interventions for Problem One Long Term Goal  Reviewed current Hgb A1C with patient and congratulated patient on being within provider recommended goal of 7-7.5, encouraged patient to monitor blood sugars at least daily if not more, discussed importance of taking meal coverage insulin only when eating meals and the importance of bedtime snack to prevent hypoglycemia, reviewed signs and symptoms of hypoglycemia and hyperglycemia, encouraged to keep and attend medical appointments, reviewed medications and indications and encouarged medication compliance, reviewed and discussed healthier low carbohydrate food and drink options  THN CM Short Term Goal #1   Patient will report no falls in the next 90 days.  THN CM Short Term Goal #1 Start Date  06/15/18  [facility] CM Short Term Goal #1 Met Date  06/15/18  Interventions for Short Term Goal #1  Fall precautions and  preventions reviewed and discussed, encouraged patient to utilize cane or walker with ambulation, encouraged patient to monitor her surroundings and place hand on chair prior to attempting to sitting down, encouraged patient to discuss numbness in extremities with providers, encouraged patient to take breaks throughout time of completing task  [facility] CM Short Term Goal #2         Appointments:  Patient last attended medical appointment with Tennis Must. Dewey on 01/24/2018 and has scheduled follow up on 08/01/2018.  Last seen Endocrinologist, Jacolyn Reedy FNP on 04/25/2018 and has scheduled follow up on 07/18/2018.  Plan: RN Health Coach will send primary care provider a quarterly update. RN Health Coach will make next telephone outreach to patient with in the month of [place].  [place] [phone] [email or web address]

## 2018-06-27 ENCOUNTER — Telehealth: Payer: Self-pay | Admitting: *Deleted

## 2018-06-27 MED ORDER — PHENYTOIN SODIUM EXTENDED 100 MG PO CAPS: ORAL_CAPSULE | ORAL | 2 refills | Status: DC

## 2018-06-27 MED ORDER — PHENYTOIN SODIUM EXTENDED 100 MG PO CAPS: ORAL_CAPSULE | ORAL | 0 refills | Status: DC

## 2018-06-27 NOTE — Telephone Encounter (Signed)
I contacted the pt. She states she is in need of a 30 day supply of Dilantin 100 mg and a 90 day supply of the same prescription. She states at this time she has plenty of the 50 mg Dilantin. 30 day has been sent to the [facility] on [address] and a 90 day supply of the 100 mg has been submitted to [facility]. Patient verbalized appreciation for the call and had no further questions/conerns.

## 2018-06-27 NOTE — Telephone Encounter (Signed)
Patient needs refills on Dilantin 100Mg  and 50Mg .  Needs the 100Mg  called to [facility] on [address] since she is out.   Usually gets these through [facility].  Please also send both to [facility] as well.  Please call patient with questions and to let her know it has been handled.  Needs a call back by 2:00PM  today

## 2018-07-01 DIAGNOSIS — J019 Acute sinusitis, unspecified: Secondary | ICD-10-CM | POA: Diagnosis not present

## 2018-07-12 ENCOUNTER — Telehealth: Payer: Self-pay | Admitting: Neurology

## 2018-07-12 MED ORDER — PRIMIDONE 50 MG PO TABS: 100.0000 mg | ORAL_TABLET | Freq: Two times a day (BID) | ORAL | 1 refills | Status: DC

## 2018-07-12 NOTE — Telephone Encounter (Signed)
Pt's niece Emelda Fear called stating that pts tremors are more than usual lately and would like to know if she can take an extra primidone (MYSOLINE) 50 MG tablet or if there is something else she can take for the tremors. Please advise.

## 2018-07-12 NOTE — Addendum Note (Signed)
Addended by: Kathrynn Ducking on: 07/12/2018 01:30 PM   Modules accepted: Orders

## 2018-07-12 NOTE — Telephone Encounter (Signed)
I called the patient.  The patient has had increasing tremors recently, I will go up on the Mysoline dose taking 100 mg twice daily, new prescription was sent in.

## 2018-07-14 ENCOUNTER — Other Ambulatory Visit: Payer: Self-pay

## 2018-07-14 MED ORDER — PRIMIDONE 50 MG PO TABS: ORAL_TABLET | ORAL | 1 refills | Status: DC

## 2018-07-15 DIAGNOSIS — I10 Essential (primary) hypertension: Secondary | ICD-10-CM | POA: Diagnosis not present

## 2018-07-15 DIAGNOSIS — E039 Hypothyroidism, unspecified: Secondary | ICD-10-CM | POA: Diagnosis not present

## 2018-07-15 DIAGNOSIS — E559 Vitamin D deficiency, unspecified: Secondary | ICD-10-CM | POA: Diagnosis not present

## 2018-07-18 DIAGNOSIS — R197 Diarrhea, unspecified: Secondary | ICD-10-CM | POA: Diagnosis not present

## 2018-07-19 DIAGNOSIS — R197 Diarrhea, unspecified: Secondary | ICD-10-CM | POA: Diagnosis not present

## 2018-07-24 ENCOUNTER — Other Ambulatory Visit: Payer: Self-pay | Admitting: Neurology

## 2018-07-25 ENCOUNTER — Telehealth: Payer: Self-pay | Admitting: Neurology

## 2018-07-25 DIAGNOSIS — N390 Urinary tract infection, site not specified: Secondary | ICD-10-CM | POA: Diagnosis not present

## 2018-07-25 DIAGNOSIS — R197 Diarrhea, unspecified: Secondary | ICD-10-CM | POA: Diagnosis not present

## 2018-07-25 DIAGNOSIS — K219 Gastro-esophageal reflux disease without esophagitis: Secondary | ICD-10-CM | POA: Diagnosis not present

## 2018-07-25 DIAGNOSIS — W19XXXA Unspecified fall, initial encounter: Secondary | ICD-10-CM | POA: Diagnosis not present

## 2018-07-25 MED ORDER — ALPRAZOLAM 0.25 MG PO TABS: 0.2500 mg | ORAL_TABLET | Freq: Three times a day (TID) | ORAL | 1 refills | Status: DC | PRN

## 2018-07-25 NOTE — Addendum Note (Signed)
Addended by: Kathrynn Ducking on: 07/25/2018 03:08 PM   Modules accepted: Orders

## 2018-07-25 NOTE — Telephone Encounter (Signed)
[place] drug registry verified, last refill was 04/07/18 # 90 for a 90 day supply.  Pt has been compliant with f/u's.

## 2018-07-25 NOTE — Telephone Encounter (Signed)
[facility] [phone]

## 2018-07-25 NOTE — Telephone Encounter (Signed)
Pt states she spoke with Dr Jannifer Franklin a few weeks ago and she was told by him that her ALPRAZolam Duanne Moron) 0.25 MG tablet would be called into [facility].  Pt has received her primidone (MYSOLINE) 50 MG tablet.  Pt states she is a nervouse wreck and very much in need of her ALPRAZolam (XANAX) 0.25 MG tablet.  Please call

## 2018-07-25 NOTE — Telephone Encounter (Signed)
A prescription will be sent in for the alprazolam.

## 2018-07-26 ENCOUNTER — Other Ambulatory Visit: Payer: Self-pay

## 2018-07-26 ENCOUNTER — Ambulatory Visit: Payer: Medicare Other | Admitting: Podiatry

## 2018-07-26 ENCOUNTER — Ambulatory Visit (INDEPENDENT_AMBULATORY_CARE_PROVIDER_SITE_OTHER): Payer: Medicare Other | Admitting: Podiatry

## 2018-07-26 DIAGNOSIS — E1142 Type 2 diabetes mellitus with diabetic polyneuropathy: Secondary | ICD-10-CM | POA: Diagnosis not present

## 2018-07-26 DIAGNOSIS — B351 Tinea unguium: Secondary | ICD-10-CM

## 2018-07-26 DIAGNOSIS — Q828 Other specified congenital malformations of skin: Secondary | ICD-10-CM | POA: Diagnosis not present

## 2018-07-26 DIAGNOSIS — L84 Corns and callosities: Secondary | ICD-10-CM | POA: Diagnosis not present

## 2018-07-26 DIAGNOSIS — M79675 Pain in left toe(s): Secondary | ICD-10-CM

## 2018-07-26 DIAGNOSIS — M79674 Pain in right toe(s): Secondary | ICD-10-CM | POA: Diagnosis not present

## 2018-07-26 NOTE — Patient Instructions (Signed)
Diabetes Mellitus and Foot Care Foot care is an important part of your health, especially when you have diabetes. Diabetes Barringer cause you to have problems because of poor blood flow (circulation) to your feet and legs, which can cause your skin to:  Become thinner and drier.  Break more easily.  Heal more slowly.  Peel and crack. You Sites also have nerve damage (neuropathy) in your legs and feet, causing decreased feeling in them. This means that you Pizzi not notice minor injuries to your feet that could lead to more serious problems. Noticing and addressing any potential problems early is the best way to prevent future foot problems. How to care for your feet Foot hygiene  Wash your feet daily with warm water and mild soap. Do not use hot water. Then, pat your feet and the areas between your toes until they are completely dry. Do not soak your feet as this can dry your skin.  Trim your toenails straight across. Do not dig under them or around the cuticle. File the edges of your nails with an emery board or nail file.  Apply a moisturizing lotion or petroleum jelly to the skin on your feet and to dry, brittle toenails. Use lotion that does not contain alcohol and is unscented. Do not apply lotion between your toes. Shoes and socks  Wear clean socks or stockings every day. Make sure they are not too tight. Do not wear knee-high stockings since they Langwell decrease blood flow to your legs.  Wear shoes that fit properly and have enough cushioning. Always look in your shoes before you put them on to be sure there are no objects inside.  To break in new shoes, wear them for just a few hours a day. This prevents injuries on your feet. Wounds, scrapes, corns, and calluses  Check your feet daily for blisters, cuts, bruises, sores, and redness. If you cannot see the bottom of your feet, use a mirror or ask someone for help.  Do not cut corns or calluses or try to remove them with medicine.  If you  find a minor scrape, cut, or break in the skin on your feet, keep it and the skin around it clean and dry. You Timpone clean these areas with mild soap and water. Do not clean the area with peroxide, alcohol, or iodine.  If you have a wound, scrape, corn, or callus on your foot, look at it several times a day to make sure it is healing and not infected. Check for: ? Redness, swelling, or pain. ? Fluid or blood. ? Warmth. ? Pus or a bad smell. General instructions  Do not cross your legs. This Hodder decrease blood flow to your feet.  Do not use heating pads or hot water bottles on your feet. They Giovannini burn your skin. If you have lost feeling in your feet or legs, you Thiemann not know this is happening until it is too late.  Protect your feet from hot and cold by wearing shoes, such as at the beach or on hot pavement.  Schedule a complete foot exam at least once a year (annually) or more often if you have foot problems. If you have foot problems, report any cuts, sores, or bruises to your health care provider immediately. Contact a health care provider if:  You have a medical condition that increases your risk of infection and you have any cuts, sores, or bruises on your feet.  You have an injury that is not   healing.  You have redness on your legs or feet.  You feel burning or tingling in your legs or feet.  You have pain or cramps in your legs and feet.  Your legs or feet are numb.  Your feet always feel cold.  You have pain around a toenail. Get help right away if:  You have a wound, scrape, corn, or callus on your foot and: ? You have pain, swelling, or redness that gets worse. ? You have fluid or blood coming from the wound, scrape, corn, or callus. ? Your wound, scrape, corn, or callus feels warm to the touch. ? You have pus or a bad smell coming from the wound, scrape, corn, or callus. ? You have a fever. ? You have a red line going up your leg. Summary  Check your feet every day  for cuts, sores, red spots, swelling, and blisters.  Moisturize feet and legs daily.  Wear shoes that fit properly and have enough cushioning.  If you have foot problems, report any cuts, sores, or bruises to your health care provider immediately.  Schedule a complete foot exam at least once a year (annually) or more often if you have foot problems. This information is not intended to replace advice given to you by your health care provider. Make sure you discuss any questions you have with your health care provider. Document Released: 04/17/2000 Document Revised: 06/02/2017 Document Reviewed: 05/22/2016 Elsevier Interactive Patient Education  2019 Elsevier Inc.  Onychomycosis/Fungal Toenails  WHAT IS IT? An infection that lies within the keratin of your nail plate that is caused by a fungus.  WHY ME? Fungal infections affect all ages, sexes, races, and creeds.  There Pongratz be many factors that predispose you to a fungal infection such as age, coexisting medical conditions such as diabetes, or an autoimmune disease; stress, medications, fatigue, genetics, etc.  Bottom line: fungus thrives in a warm, moist environment and your shoes offer such a location.  IS IT CONTAGIOUS? Theoretically, yes.  You do not want to share shoes, nail clippers or files with someone who has fungal toenails.  Walking around barefoot in the same room or sleeping in the same bed is unlikely to transfer the organism.  It is important to realize, however, that fungus can spread easily from one nail to the next on the same foot.  HOW DO WE TREAT THIS?  There are several ways to treat this condition.  Treatment Rasp depend on many factors such as age, medications, pregnancy, liver and kidney conditions, etc.  It is best to ask your doctor which options are available to you.  1. No treatment.   Unlike many other medical concerns, you can live with this condition.  However for many people this can be a painful condition and  [place] lead to ingrown toenails or a bacterial infection.  It is recommended that you keep the nails cut short to help reduce the amount of fungal nail. 2. Topical treatment.  These range from herbal remedies to prescription strength nail lacquers.  About 40-50% effective, topicals require twice daily application for approximately 9 to 12 months or until an entirely new nail has grown out.  The most effective topicals are medical grade medications available through physicians offices. 3. Oral antifungal medications.  With an 80-90% cure rate, the most common oral medication requires 3 to 4 months of therapy and stays in your system for a year as the new nail grows out.  Oral antifungal medications do require   blood work to make sure it is a safe drug for you.  A liver function panel will be performed prior to starting the medication and after the first month of treatment.  It is important to have the blood work performed to avoid any harmful side effects.  In general, this medication safe but blood work is required. 4. Laser Therapy.  This treatment is performed by applying a specialized laser to the affected nail plate.  This therapy is noninvasive, fast, and non-painful.  It is not covered by insurance and is therefore, out of pocket.  The results have been very good with a 80-95% cure rate.  The [facility] is the only practice in the area to offer this therapy. 5. Permanent Nail Avulsion.  Removing the entire nail so that a new nail will not grow back. 

## 2018-07-29 DIAGNOSIS — Z794 Long term (current) use of insulin: Secondary | ICD-10-CM | POA: Diagnosis not present

## 2018-07-29 DIAGNOSIS — E039 Hypothyroidism, unspecified: Secondary | ICD-10-CM | POA: Diagnosis not present

## 2018-07-29 DIAGNOSIS — E1165 Type 2 diabetes mellitus with hyperglycemia: Secondary | ICD-10-CM | POA: Diagnosis not present

## 2018-08-01 ENCOUNTER — Encounter: Payer: Self-pay | Admitting: Podiatry

## 2018-08-01 NOTE — Progress Notes (Signed)
Subjective: [name] presents to clinic with h/o diabetic neuropathy. Pain is aggravated when wearing enclosed shoe gear. Pain is relieved with periodic professional debridement.  She notes no new problems on today's visit.  [name], MD is her PCP.   Allergies  Allergen Reactions  . Bystolic [Nebivolol Hcl] Other (See Comments)    "extreme weakness, heaviness in legs & arms, swelling in legs/arms/face, swollen abdomen, pain in bladder, feet pain, soreness in chest"  . Cholestyramine     "itching rash on stomach, bloated, nausea, vomiting, sleeplessness, extreme pain in arms"  . Hydrazine Yellow [Tartrazine] Other (See Comments)    "does not reduce high blood pressure, pain in arm, high pressure, felt like I was on verge of heart attack, really weak"  . Morphine Other (See Comments)    Feels morbid, weak, still in pain  . Niacin Palpitations    Fast heart beat  . Niaspan [Niacin Er] Palpitations and Other (See Comments)    "fast heart beat, high blood pressure"  . Norvasc [Amlodipine Besylate] Other (See Comments)    "extreme fluid retention/pain)  . Optivar [Azelastine Hcl] Photosensitivity  . Repaglinide Hives  . Sular [Nisoldipine Er] Other (See Comments)    "severe headaches, swelling eyes, hands, feet, shortness of breath, weak, flushed face, brain boiling, fluid retention, high blood sugar, nervous, heart fast beating"  . Tegretol [Carbamazepine] Other (See Comments)    Blood poisoning   . Telmisartan Other (See Comments)    "headache, difficulty urinating, high blood sugar, fluid retention"  . Cefdinir Swelling    Vaginal irritation, breathing,   . Clonidine Other (See Comments)    Dry mouth, fluid retention  . Clonidine Hydrochloride Other (See Comments)    Dry mouth, fluid retention  . Codeine Nausea And Vomiting  . Ezetimibe Other (See Comments)    Made weak  . Naproxen Other (See Comments)    Shrinks bladder  . Ziac [Bisoprolol-Hydrochlorothiazide]  Other (See Comments)    "stopped urination"  . Elavil [Amitriptyline] Other (See Comments)    Gave Pt nightmares  . Keppra [Levetiracetam] Other (See Comments)    Shaking  . Lamictal [Lamotrigine]     itching  . Lyrica [Pregabalin] Swelling  . Topamax [Topiramate]     Dry eyes  . Ace Inhibitors Other (See Comments)    unknown  . Actonel [Risedronate Sodium] Other (See Comments)    unknown  . Amlodipine Besylate Other (See Comments)    unknown  . Aspirin Other (See Comments)    unknown  . Atacand [Candesartan] Other (See Comments)    unknown  . Bextra [Valdecoxib] Other (See Comments)    unknown  . Bisoprolol-Hydrochlorothiazide Other (See Comments)    unknown  . Candesartan Cilexetil Other (See Comments)    unknown  . Cefadroxil Other (See Comments)    unknown  . Celecoxib Rash  . Hydrocodone Other (See Comments)    unknown  . Hydrocodone-Acetaminophen Other (See Comments)    unknown  . Iodinated Diagnostic Agents Rash    "All over" "All over"  . Meloxicam Other (See Comments)    unknown  . Methylprednisolone Sodium Succinate Other (See Comments)    unknown  . Nabumetone Other (See Comments)    unknown  . Penicillins Other (See Comments)    unknown  . Pseudoephedrine-Guaifenesin Other (See Comments)    unknown  . Risedronate Sodium Other (See Comments)    unknown  . Rofecoxib Other (See Comments)    unknown  .  Ru-Tuss [Chlorphen-Pse-Atrop-Hyos-Scop] Other (See Comments)    unknown  . Sulfonamide Derivatives Other (See Comments)    unknown  . Sulphur [Sulfur] Other (See Comments)    unknown  . Telithromycin Other (See Comments)    unknown  . Terfenadine Other (See Comments)    unknown  . Trandolapril-Verapamil Hcl Er Other (See Comments)    Headache, difficulty urinating, high blood sugar, fluid retention  Pt is taking Tarka (trandolapril-verapamil) currently, but requests the medication stay in her allergy list  . Trandolapril-Verapamil Hcl Er Other  (See Comments)    Headache, difficulty urinating, high blood sugar, fluid retention  Pt is taking Tarka (trandolapril-verapamil) currently, but requests the medication stay in her allergy list  . Valium [Diazepam] Other (See Comments)    Makes her mean and hyper    Vascular Examination: Capillary refill time <3 seconds x 10 digits Dorsalis pedis and Posterior tibial pulses 1/4 b/l No digital hair x 10 digits Skin temperature gradient WNL b/l  Dermatological Examination: Skin with normal turgor, texture and tone b/l  Toenails 1-5 b/l discolored, thick, dystrophic with subungual debris and pain with palpation to nailbeds due to thickness of nails.  Porokeratotic lesion submet head 1 b/l with visible subdermal hemorrhage noted on left foot lesion. No flocculence, no erythema, no edema, no drainage.   Hemorrhagic hyperkeratotic lesion noted distal tip of left 2nd digit, right 3rd digit and dorsal PIPJ of left 2nd digit, left 5th digit. No erythema, no edema, no drainage, no flocculence.   Musculoskeletal: Muscle strength 5/5 to all LE muscle groups  Rigid hammertoe deformity b/l 2nd digits  Semirigid hammertoe deformity digits 3-5 b/l  Neurological: Sensation diminished with 10 gram monofilament.   Assessment: 1. Painful onychomycosis toenails 1-5 b/l 2. Porokeratosis submet head 1 b/l 3. Corn distal tip left 2nd digit, right 3rd digit, dorsal PIPJ left 2nd digit and dorsal 5th digit 4. NIDDM with Diabetic neuropathy  Plan: 1. Continue diabetic foot care principles. Literature dispensed 2. Toenails 1-5 b/l were debrided in length and girth without iatrogenic bleeding. 5. Porokeratosis submetatarsal head 1 b/l  pared and enucleated with sterile scalpel blade without incident.Corn(s) distal tip left 2nd digit, right 3rd digit, dorsal PIPJ left 2nd digit and dorsal 5th digit pared utilizing sterile scalpel blade without incident. 3. Patient to continue soft, supportive shoe gear  daily. 4. Patient to report any pedal injuries to medical professional  5. Follow up 3 months. Patient/POA to call should there be a concern in the interim.

## 2018-08-02 ENCOUNTER — Ambulatory Visit: Payer: Medicare Other | Admitting: Podiatry

## 2018-08-02 DIAGNOSIS — E039 Hypothyroidism, unspecified: Secondary | ICD-10-CM | POA: Diagnosis not present

## 2018-08-02 DIAGNOSIS — E559 Vitamin D deficiency, unspecified: Secondary | ICD-10-CM | POA: Diagnosis not present

## 2018-08-02 DIAGNOSIS — F411 Generalized anxiety disorder: Secondary | ICD-10-CM | POA: Diagnosis not present

## 2018-08-02 DIAGNOSIS — E782 Mixed hyperlipidemia: Secondary | ICD-10-CM | POA: Diagnosis not present

## 2018-08-02 DIAGNOSIS — I10 Essential (primary) hypertension: Secondary | ICD-10-CM | POA: Diagnosis not present

## 2018-08-03 DIAGNOSIS — I10 Essential (primary) hypertension: Secondary | ICD-10-CM | POA: Diagnosis not present

## 2018-08-03 DIAGNOSIS — Z719 Counseling, unspecified: Secondary | ICD-10-CM | POA: Diagnosis not present

## 2018-08-03 DIAGNOSIS — F411 Generalized anxiety disorder: Secondary | ICD-10-CM | POA: Diagnosis not present

## 2018-08-05 DIAGNOSIS — F411 Generalized anxiety disorder: Secondary | ICD-10-CM | POA: Diagnosis not present

## 2018-08-05 DIAGNOSIS — I10 Essential (primary) hypertension: Secondary | ICD-10-CM | POA: Diagnosis not present

## 2018-08-05 DIAGNOSIS — R5383 Other fatigue: Secondary | ICD-10-CM | POA: Diagnosis not present

## 2018-08-15 DIAGNOSIS — R5383 Other fatigue: Secondary | ICD-10-CM | POA: Diagnosis not present

## 2018-08-15 DIAGNOSIS — I10 Essential (primary) hypertension: Secondary | ICD-10-CM | POA: Diagnosis not present

## 2018-08-15 DIAGNOSIS — F331 Major depressive disorder, recurrent, moderate: Secondary | ICD-10-CM | POA: Diagnosis not present

## 2018-08-15 DIAGNOSIS — F411 Generalized anxiety disorder: Secondary | ICD-10-CM | POA: Diagnosis not present

## 2018-08-16 DIAGNOSIS — K580 Irritable bowel syndrome with diarrhea: Secondary | ICD-10-CM | POA: Diagnosis not present

## 2018-08-29 DIAGNOSIS — G4700 Insomnia, unspecified: Secondary | ICD-10-CM | POA: Diagnosis not present

## 2018-08-29 DIAGNOSIS — F411 Generalized anxiety disorder: Secondary | ICD-10-CM | POA: Diagnosis not present

## 2018-08-29 DIAGNOSIS — I10 Essential (primary) hypertension: Secondary | ICD-10-CM | POA: Diagnosis not present

## 2018-08-30 DIAGNOSIS — K580 Irritable bowel syndrome with diarrhea: Secondary | ICD-10-CM | POA: Diagnosis not present

## 2018-09-06 ENCOUNTER — Other Ambulatory Visit: Payer: Self-pay | Admitting: *Deleted

## 2018-09-06 ENCOUNTER — Encounter: Payer: Self-pay | Admitting: *Deleted

## 2018-09-06 NOTE — Patient Outreach (Signed)
[facility]) Care Management  [place]  09/06/2018   [name] 06-09-1929 [id number]   [place] Quarterly Outreach   Referral Date:  09/06/2017 Referral Source:  Transfer from [place] Reason for Referral:  Continued Disease Management Education Insurance:  Medicare   Outreach Attempt:  Successful telephone outreach to patient for follow up.  HIPAA verified with patient.  Patient reporting she has been having diarrhea and has been working with her gastroenterologist to help with this issue.  Reports it is getting better but still taking imodium daily.  Also reporting hypertensive episodes recently.  Admits to stress related to her sister in law recent death and inability to go to funeral.  Reports she has been followed by her primary care provider every to weeks to monitor her blood pressure.  Blood pressure to day was 130/80 (readings have been controlled lately).  Medications for diabetes have been adjusted.  Was having episodes of hypoglycemia with fast acting meal coverage insulin.  This was switched to oral hypoglycemic Jardiance.  Jardiance with titrated up with now hyperglycemic readings.  She has spoken with endocrinologist today and is to start on Lantus nightly in addition to the oral hypoglycemics.  Verbalizes she understands the medication changes at this time.  Fasting blood sugar this morning was 220 with fasting ranges of 220-300's.    Encounter Medications:  Outpatient Encounter Medications as of 09/06/2018  Medication Sig Note  . ALPRAZolam (XANAX) 0.25 MG tablet Take 1 tablet (0.25 mg total) by mouth 3 (three) times daily as needed for anxiety.   . Biotin 1000 MCG tablet Take 1,000 mcg by mouth daily.    . Blood Glucose Monitoring Suppl (PRODIGY VOICE BLOOD GLUCOSE) w/Device KIT Use to check blood sugar 1 time per day.   . Cholecalciferol 5000 units capsule Take 10,000 Units by mouth daily.  07/20/2017: Pt reports she was told to take  2 tablets daily b/c she had low Vit D.  . cyanocobalamin (,VITAMIN B-12,) 1000 MCG/ML injection INJECT 1 ML INTRAMUSCULARLY EVERY 21 DAYS   . DiphenhydrAMINE HCl (BENADRYL ALLERGY PO) Take 1 tablet by mouth as needed.   . empagliflozin (JARDIANCE) 25 MG TABS tablet Take 25 mg by mouth daily.   Marland Kitchen escitalopram (LEXAPRO) 10 MG tablet Take 10 mg by mouth daily. 01/25/2018: Taking 10 mg daily  . furosemide (LASIX) 20 MG tablet Take 1 tablet (20 mg total) by mouth daily. Do not take until I call you about labs. (Patient taking differently: Take 20 mg by mouth as needed. Do not take until I call you about labs.) 07/19/2014: .  . glucose blood test strip Use to check blood sugar 1 time per day.   . insulin glargine (LANTUS) 100 UNIT/ML injection Inject 10 Units into the skin daily.   . Insulin Pen Needle 32G X 4 MM MISC Used to inject insulin 3x daily   . levothyroxine (SYNTHROID, LEVOTHROID) 50 MCG tablet TAKE 1 TABLET DAILY BEFORE BREAKFAST 01/25/2018: Reports taking 75 mcg daily  . lidocaine (LIDODERM) 5 % Place 1 patch onto the skin daily. Remove & Discard patch within 12 hours or as directed by MD (Patient taking differently: Place 2 patches onto the skin daily as needed (pain). Remove & Discard patch within 12 hours or as directed by MD)   . loperamide (IMODIUM) 2 MG capsule Take 2 mg by mouth as needed for diarrhea or loose stools.   . phenytoin (DILANTIN) 100 MG ER capsule  2 capsules at bedtime and 1 in the morning   . PHENYTOIN INFATABS 50 MG tablet CHEW 1 TABLET DAILY   . Polyethyl Glycol-Propyl Glycol 0.4-0.3 % SOLN Place 2 drops into both eyes at bedtime.  08/31/2017: Applies in Am and PM  . primidone (MYSOLINE) 50 MG tablet Take 2 tablets by mouth twice daily   . Probiotic Product (ALIGN) 4 MG CAPS Take 2 capsules by mouth 3 (three) times daily after meals.   . saccharomyces boulardii (FLORASTOR) 250 MG capsule Take 250 mg by mouth 2 (two) times daily.   . traMADol (ULTRAM) 50 MG tablet Take 50 mg  by mouth every 6 (six) hours as needed.    . trandolapril-verapamil (TARKA) 2-240 MG tablet Take 1 tablet by mouth daily.   Marland Kitchen amoxicillin-clavulanate (AUGMENTIN) 500-125 MG tablet TK 1 T PO TID   . amoxicillin-clavulanate (AUGMENTIN) 875-125 MG tablet Take 1 tablet by mouth 2 (two) times daily. 06/15/2018: completed  . atorvastatin (LIPITOR) 20 MG tablet Take 1 tablet by mouth daily. 09/06/2018: Reports not taking  . colestipol (COLESTID) 1 G tablet Take 1-2 g by mouth 3 (three) times daily. Take 2 tablets in the morning, 1 tablet at noon, and 1 tablet in the evening. (per pt report) 09/06/2018: Reports was told to stop medication by GI doctor  . doxycycline (VIBRAMYCIN) 100 MG capsule    . ferrous sulfate 325 (65 FE) MG tablet Take 325 mg by mouth every other day. 09/06/2018: Reports not taking at this time  . insulin lispro (HUMALOG KWIKPEN) 100 UNIT/ML KiwkPen Inject 0.07 mLs (7 Units total) into the skin 3 (three) times daily with meals. And pen needles 1/day (Patient not taking: Reported on 09/06/2018) 06/15/2018: Dose decreased to 7 units three times a day  . predniSONE (STERAPRED UNI-PAK 48 TAB) 10 MG (48) TBPK tablet Take 10 mg by mouth 2 (two) times daily. 06/15/2018: completed  . ranitidine (ZANTAC) 150 MG capsule Take 150 mg by mouth daily as needed for heartburn.     No facility-administered encounter medications on file as of 09/06/2018.     Functional Status:  In your present state of health, do you have any difficulty performing the following activities: 09/06/2018 10/07/2017  Hearing? N N  Vision? Y Y  Comment double vision, trouble seeing out of right eye no peripheral vision double vision and some difficulty seeing  Difficulty concentrating or making decisions? Y Y  Comment forgetful -  Walking or climbing stairs? Y Y  Comment frequent falls and unable to climb stairs -  Dressing or bathing? N N  Doing errands, shopping? [name]  Comment caregivers help with shopping -  Preparing Food and  eating ? [name]  Comment caregivers assist with preparing meals -  Using the Toilet? N N  In the past six months, have you accidently leaked urine? N N  Do you have problems with loss of bowel control? Y Y  Comment has been having diarrhea (getting better) -  Managing your Medications? N Y  Managing your Finances? N N  Housekeeping or managing your Housekeeping? [name]  Comment has caregivers assist with house cleaning -  Some recent data might be hidden    Fall/Depression Screening: Fall Risk  09/06/2018 06/15/2018 01/25/2018  Falls in the past year? 1 1 Yes  Comment Last fall in March 2020 [place] in January trying to sit in chair no fall since 12/17/2017  Number falls in past yr: '1 1 2 ' or more  Comment - - -  Injury with Fall? 1 1 Yes  Comment - - -  Risk Factor Category  High Risk (2 or more Points) High Risk (2 or more Points) High Fall Risk  Risk for fall due to : History of fall(s);Impaired vision;Medication side effect;Impaired balance/gait;Impaired mobility History of fall(s);Impaired vision;Medication side effect;Impaired balance/gait;Impaired mobility History of fall(s);Impaired balance/gait;Medication side effect;Impaired mobility;Impaired vision  Risk for fall due to: Comment - - -  Follow up Education provided;Falls evaluation completed;Falls prevention discussed Falls evaluation completed;Falls prevention discussed;Education provided Falls prevention discussed;Education provided   [facility] 2/9 Scores 09/06/2018 10/07/2017 07/12/2017 07/01/2016 05/27/2016 05/13/2016 02/06/2013  PHQ - 2 Score 0 0 0 0 0 0 0   THN CM Care Plan Problem One     Most Recent Value  Care Plan Problem One  Knowledge deficiet related to self management of falls and diabetes  Role Documenting the Problem One  [place] for Problem One  Active  THN Long Term Goal   Patient will maintain Hgb A1C below 7.5 in the next 90 days,  THN Long Term Goal Start Date  09/06/18  Interventions for Problem One Long Term  Goal  Reviewed recent medication changes and encouraged medication compliance, reviewed recent blood sugar ranges and discussed ways to reduce ranges, reviewed blood pressure ranges and discused ways to [facility] lower blood pressures, encouraged diabetic low salt diet, discussed how stress affects blood sugars and blood pressure, discussed ways to help reduce diarrhea, encouraged to keep and attend scheduled appointments, encouraged to keep monitoring blood sugars and notifying endocrinologist of sugar ranges for medication adjustments,  encouraged patient to monitor for hypoglycemia with the initiation of long acting insulin soon  THN CM Short Term Goal #1   Patient will report no falls in the next 90 days.  THN CM Short Term Goal #1 Start Date  09/06/18  Interventions for Short Term Goal #1  Fall precautions reviewed and discussed, encouraged patient to review previously sent EMMI Fall education, discussed safety with family being able to enter the home in case of falls and not baracading all the doors, discussed rug safety, encouraged patient to use cane or walker to help balance, encouraged patient to call for help when she falls and cannot get up alone, encouraged patient to keep panic button with her at all times in case of fall  THN CM Short Term Goal #2         Appointments:  Patient reports receiving telehealth calls from primary care provider, Dr. Ernie Hew every 2 weeks to monitor hypertension.  Last attended telehealth appointment with endocrinologist on 07/29/2018 and has scheduled follow up on 09/30/2018.  Reports glucose trends to endocrinologist every few weeks for medication adjustments.  Plan: RN Health Coach will send primary care provider quarterly update. RN Health Coach will make next telephone outreach to patient within the month of July.  [place] [phone] [email or web address]

## 2018-09-15 DIAGNOSIS — R1313 Dysphagia, pharyngeal phase: Secondary | ICD-10-CM | POA: Diagnosis not present

## 2018-09-15 DIAGNOSIS — K580 Irritable bowel syndrome with diarrhea: Secondary | ICD-10-CM | POA: Diagnosis not present

## 2018-09-29 DIAGNOSIS — M25562 Pain in left knee: Secondary | ICD-10-CM | POA: Insufficient documentation

## 2018-10-03 DIAGNOSIS — M1712 Unilateral primary osteoarthritis, left knee: Secondary | ICD-10-CM | POA: Diagnosis not present

## 2018-10-03 DIAGNOSIS — M25562 Pain in left knee: Secondary | ICD-10-CM | POA: Diagnosis not present

## 2018-10-10 DIAGNOSIS — K580 Irritable bowel syndrome with diarrhea: Secondary | ICD-10-CM | POA: Diagnosis not present

## 2018-10-11 DIAGNOSIS — M25562 Pain in left knee: Secondary | ICD-10-CM | POA: Diagnosis not present

## 2018-10-17 DIAGNOSIS — E039 Hypothyroidism, unspecified: Secondary | ICD-10-CM | POA: Diagnosis not present

## 2018-10-17 DIAGNOSIS — S83282A Other tear of lateral meniscus, current injury, left knee, initial encounter: Secondary | ICD-10-CM | POA: Diagnosis not present

## 2018-10-17 DIAGNOSIS — E1165 Type 2 diabetes mellitus with hyperglycemia: Secondary | ICD-10-CM | POA: Diagnosis not present

## 2018-10-17 DIAGNOSIS — S83242A Other tear of medial meniscus, current injury, left knee, initial encounter: Secondary | ICD-10-CM | POA: Diagnosis not present

## 2018-10-17 DIAGNOSIS — Z794 Long term (current) use of insulin: Secondary | ICD-10-CM | POA: Diagnosis not present

## 2018-10-17 DIAGNOSIS — M1712 Unilateral primary osteoarthritis, left knee: Secondary | ICD-10-CM | POA: Diagnosis not present

## 2018-10-25 ENCOUNTER — Encounter (HOSPITAL_COMMUNITY): Payer: Self-pay | Admitting: Family Medicine

## 2018-10-25 ENCOUNTER — Emergency Department (HOSPITAL_COMMUNITY): Payer: Medicare Other

## 2018-10-25 ENCOUNTER — Inpatient Hospital Stay (HOSPITAL_COMMUNITY)
Admission: EM | Admit: 2018-10-25 | Discharge: 2018-10-29 | DRG: 563 | Disposition: A | Discharge status: Skilled Nursing Facility | Payer: Medicare Other | Attending: Internal Medicine | Admitting: Internal Medicine

## 2018-10-25 ENCOUNTER — Ambulatory Visit (INDEPENDENT_AMBULATORY_CARE_PROVIDER_SITE_OTHER): Payer: Medicare Other | Admitting: Podiatry

## 2018-10-25 ENCOUNTER — Other Ambulatory Visit: Payer: Self-pay

## 2018-10-25 VITALS — Temp 97.9°F

## 2018-10-25 DIAGNOSIS — E041 Nontoxic single thyroid nodule: Secondary | ICD-10-CM | POA: Diagnosis present

## 2018-10-25 DIAGNOSIS — M79674 Pain in right toe(s): Secondary | ICD-10-CM

## 2018-10-25 DIAGNOSIS — M810 Age-related osteoporosis without current pathological fracture: Secondary | ICD-10-CM | POA: Diagnosis present

## 2018-10-25 DIAGNOSIS — G9389 Other specified disorders of brain: Secondary | ICD-10-CM | POA: Diagnosis not present

## 2018-10-25 DIAGNOSIS — L84 Corns and callosities: Secondary | ICD-10-CM

## 2018-10-25 DIAGNOSIS — Z853 Personal history of malignant neoplasm of breast: Secondary | ICD-10-CM

## 2018-10-25 DIAGNOSIS — Z8781 Personal history of (healed) traumatic fracture: Secondary | ICD-10-CM

## 2018-10-25 DIAGNOSIS — Z91041 Radiographic dye allergy status: Secondary | ICD-10-CM

## 2018-10-25 DIAGNOSIS — M1712 Unilateral primary osteoarthritis, left knee: Secondary | ICD-10-CM | POA: Diagnosis not present

## 2018-10-25 DIAGNOSIS — M25462 Effusion, left knee: Secondary | ICD-10-CM

## 2018-10-25 DIAGNOSIS — E1165 Type 2 diabetes mellitus with hyperglycemia: Secondary | ICD-10-CM | POA: Diagnosis not present

## 2018-10-25 DIAGNOSIS — Z79890 Hormone replacement therapy (postmenopausal): Secondary | ICD-10-CM

## 2018-10-25 DIAGNOSIS — I10 Essential (primary) hypertension: Secondary | ICD-10-CM | POA: Diagnosis not present

## 2018-10-25 DIAGNOSIS — Z885 Allergy status to narcotic agent status: Secondary | ICD-10-CM

## 2018-10-25 DIAGNOSIS — Z20828 Contact with and (suspected) exposure to other viral communicable diseases: Secondary | ICD-10-CM | POA: Diagnosis not present

## 2018-10-25 DIAGNOSIS — S90421A Blister (nonthermal), right great toe, initial encounter: Secondary | ICD-10-CM | POA: Diagnosis not present

## 2018-10-25 DIAGNOSIS — Z882 Allergy status to sulfonamides status: Secondary | ICD-10-CM

## 2018-10-25 DIAGNOSIS — E1142 Type 2 diabetes mellitus with diabetic polyneuropathy: Secondary | ICD-10-CM

## 2018-10-25 DIAGNOSIS — S0083XA Contusion of other part of head, initial encounter: Secondary | ICD-10-CM | POA: Diagnosis not present

## 2018-10-25 DIAGNOSIS — S82035A Nondisplaced transverse fracture of left patella, initial encounter for closed fracture: Secondary | ICD-10-CM | POA: Diagnosis not present

## 2018-10-25 DIAGNOSIS — B351 Tinea unguium: Secondary | ICD-10-CM | POA: Diagnosis not present

## 2018-10-25 DIAGNOSIS — Z8249 Family history of ischemic heart disease and other diseases of the circulatory system: Secondary | ICD-10-CM

## 2018-10-25 DIAGNOSIS — R51 Headache: Secondary | ICD-10-CM | POA: Diagnosis not present

## 2018-10-25 DIAGNOSIS — S79912A Unspecified injury of left hip, initial encounter: Secondary | ICD-10-CM | POA: Diagnosis not present

## 2018-10-25 DIAGNOSIS — E860 Dehydration: Secondary | ICD-10-CM | POA: Diagnosis not present

## 2018-10-25 DIAGNOSIS — Z792 Long term (current) use of antibiotics: Secondary | ICD-10-CM

## 2018-10-25 DIAGNOSIS — G40909 Epilepsy, unspecified, not intractable, without status epilepticus: Secondary | ICD-10-CM | POA: Diagnosis present

## 2018-10-25 DIAGNOSIS — IMO0001 Reserved for inherently not codable concepts without codable children: Secondary | ICD-10-CM

## 2018-10-25 DIAGNOSIS — R52 Pain, unspecified: Secondary | ICD-10-CM | POA: Diagnosis not present

## 2018-10-25 DIAGNOSIS — Y92009 Unspecified place in unspecified non-institutional (private) residence as the place of occurrence of the external cause: Secondary | ICD-10-CM

## 2018-10-25 DIAGNOSIS — M1811 Unilateral primary osteoarthritis of first carpometacarpal joint, right hand: Secondary | ICD-10-CM | POA: Diagnosis not present

## 2018-10-25 DIAGNOSIS — G250 Essential tremor: Secondary | ICD-10-CM | POA: Diagnosis present

## 2018-10-25 DIAGNOSIS — K219 Gastro-esophageal reflux disease without esophagitis: Secondary | ICD-10-CM | POA: Diagnosis present

## 2018-10-25 DIAGNOSIS — S82002A Unspecified fracture of left patella, initial encounter for closed fracture: Secondary | ICD-10-CM | POA: Diagnosis present

## 2018-10-25 DIAGNOSIS — Z923 Personal history of irradiation: Secondary | ICD-10-CM

## 2018-10-25 DIAGNOSIS — E119 Type 2 diabetes mellitus without complications: Secondary | ICD-10-CM

## 2018-10-25 DIAGNOSIS — W010XXA Fall on same level from slipping, tripping and stumbling without subsequent striking against object, initial encounter: Secondary | ICD-10-CM | POA: Diagnosis present

## 2018-10-25 DIAGNOSIS — Z881 Allergy status to other antibiotic agents status: Secondary | ICD-10-CM

## 2018-10-25 DIAGNOSIS — Z85820 Personal history of malignant melanoma of skin: Secondary | ICD-10-CM

## 2018-10-25 DIAGNOSIS — Z833 Family history of diabetes mellitus: Secondary | ICD-10-CM

## 2018-10-25 DIAGNOSIS — M25562 Pain in left knee: Secondary | ICD-10-CM | POA: Diagnosis not present

## 2018-10-25 DIAGNOSIS — M47812 Spondylosis without myelopathy or radiculopathy, cervical region: Secondary | ICD-10-CM | POA: Diagnosis not present

## 2018-10-25 DIAGNOSIS — F419 Anxiety disorder, unspecified: Secondary | ICD-10-CM | POA: Diagnosis present

## 2018-10-25 DIAGNOSIS — E039 Hypothyroidism, unspecified: Secondary | ICD-10-CM | POA: Diagnosis present

## 2018-10-25 DIAGNOSIS — Z888 Allergy status to other drugs, medicaments and biological substances status: Secondary | ICD-10-CM

## 2018-10-25 DIAGNOSIS — M85841 Other specified disorders of bone density and structure, right hand: Secondary | ICD-10-CM | POA: Diagnosis not present

## 2018-10-25 DIAGNOSIS — Z794 Long term (current) use of insulin: Secondary | ICD-10-CM

## 2018-10-25 DIAGNOSIS — M79675 Pain in left toe(s): Secondary | ICD-10-CM

## 2018-10-25 DIAGNOSIS — Z79899 Other long term (current) drug therapy: Secondary | ICD-10-CM

## 2018-10-25 DIAGNOSIS — Z79891 Long term (current) use of opiate analgesic: Secondary | ICD-10-CM

## 2018-10-25 DIAGNOSIS — Z8349 Family history of other endocrine, nutritional and metabolic diseases: Secondary | ICD-10-CM

## 2018-10-25 DIAGNOSIS — S82032A Displaced transverse fracture of left patella, initial encounter for closed fracture: Secondary | ICD-10-CM | POA: Diagnosis not present

## 2018-10-25 DIAGNOSIS — Z880 Allergy status to penicillin: Secondary | ICD-10-CM

## 2018-10-25 DIAGNOSIS — E538 Deficiency of other specified B group vitamins: Secondary | ICD-10-CM | POA: Diagnosis present

## 2018-10-25 DIAGNOSIS — E876 Hypokalemia: Secondary | ICD-10-CM

## 2018-10-25 DIAGNOSIS — E871 Hypo-osmolality and hyponatremia: Secondary | ICD-10-CM

## 2018-10-25 DIAGNOSIS — W19XXXA Unspecified fall, initial encounter: Secondary | ICD-10-CM | POA: Diagnosis not present

## 2018-10-25 DIAGNOSIS — Z886 Allergy status to analgesic agent status: Secondary | ICD-10-CM

## 2018-10-25 DIAGNOSIS — J302 Other seasonal allergic rhinitis: Secondary | ICD-10-CM | POA: Diagnosis present

## 2018-10-25 DIAGNOSIS — Z8262 Family history of osteoporosis: Secondary | ICD-10-CM

## 2018-10-25 MED ORDER — LIDOCAINE 5 % EX PTCH
1.0000 | MEDICATED_PATCH | CUTANEOUS | Status: DC
  Administered 2018-10-25 – 2018-10-28 (×4): 1 via TRANSDERMAL
  Filled 2018-10-25 (×5): qty 1

## 2018-10-25 MED ORDER — ACETAMINOPHEN 500 MG PO TABS
1000.0000 mg | ORAL_TABLET | Freq: Once | ORAL | Status: AC
  Administered 2018-10-25: 1000 mg via ORAL
  Filled 2018-10-25: qty 2

## 2018-10-25 MED ORDER — FENTANYL CITRATE (PF) 100 MCG/2ML IJ SOLN
150.0000 ug | Freq: Once | INTRAMUSCULAR | Status: AC
  Administered 2018-10-25: 150 ug via INTRAVENOUS
  Filled 2018-10-25: qty 4

## 2018-10-25 MED ORDER — FENTANYL CITRATE (PF) 100 MCG/2ML IJ SOLN
100.0000 ug | Freq: Once | INTRAMUSCULAR | Status: AC
  Administered 2018-10-25: 100 ug via INTRAVENOUS
  Filled 2018-10-25: qty 2

## 2018-10-25 MED ORDER — BUPIVACAINE HCL (PF) 0.5 % IJ SOLN
20.0000 mL | Freq: Once | INTRAMUSCULAR | Status: AC
  Administered 2018-10-25: 20 mL
  Filled 2018-10-25: qty 30

## 2018-10-25 MED ORDER — FENTANYL CITRATE (PF) 100 MCG/2ML IJ SOLN
50.0000 ug | Freq: Once | INTRAMUSCULAR | Status: AC
  Administered 2018-10-25: 50 ug via INTRAVENOUS
  Filled 2018-10-25: qty 2

## 2018-10-25 MED ORDER — OXYCODONE HCL 5 MG PO TABS
5.0000 mg | ORAL_TABLET | Freq: Once | ORAL | Status: AC
  Administered 2018-10-25: 5 mg via ORAL
  Filled 2018-10-25: qty 1

## 2018-10-25 MED ORDER — OXYCODONE-ACETAMINOPHEN 5-325 MG PO TABS: 1.0000 | ORAL_TABLET | ORAL | 0 refills | Status: DC | PRN

## 2018-10-25 NOTE — ED Notes (Signed)
Notified ortho knee immobilizer order .

## 2018-10-25 NOTE — ED Notes (Signed)
Pt provided ice for her knee.

## 2018-10-25 NOTE — ED Notes (Signed)
Removed C collar with permission from [place], [place].

## 2018-10-25 NOTE — ED Triage Notes (Signed)
Patient is from and transported via [facility]. Patient was ambulatory from the bathroom to the bedroom. She fell backward and hit her head on the door. She lost her balance. She has torn ligaments on left knee piror to fall and her left knee twisted during the fall. EMS reports she has an inward rotation. She denies any LOC. She was given FENTANYL 137mcq during transport.

## 2018-10-25 NOTE — Patient Instructions (Signed)
Diabetes Mellitus and Foot Care Foot care is an important part of your health, especially when you have diabetes. Diabetes Hearne cause you to have problems because of poor blood flow (circulation) to your feet and legs, which can cause your skin to:  Become thinner and drier.  Break more easily.  Heal more slowly.  Peel and crack. You Beauregard also have nerve damage (neuropathy) in your legs and feet, causing decreased feeling in them. This means that you Sindelar not notice minor injuries to your feet that could lead to more serious problems. Noticing and addressing any potential problems early is the best way to prevent future foot problems. How to care for your feet Foot hygiene  Wash your feet daily with warm water and mild soap. Do not use hot water. Then, pat your feet and the areas between your toes until they are completely dry. Do not soak your feet as this can dry your skin.  Trim your toenails straight across. Do not dig under them or around the cuticle. File the edges of your nails with an emery board or nail file.  Apply a moisturizing lotion or petroleum jelly to the skin on your feet and to dry, brittle toenails. Use lotion that does not contain alcohol and is unscented. Do not apply lotion between your toes. Shoes and socks  Wear clean socks or stockings every day. Make sure they are not too tight. Do not wear knee-high stockings since they Koranda decrease blood flow to your legs.  Wear shoes that fit properly and have enough cushioning. Always look in your shoes before you put them on to be sure there are no objects inside.  To break in new shoes, wear them for just a few hours a day. This prevents injuries on your feet. Wounds, scrapes, corns, and calluses  Check your feet daily for blisters, cuts, bruises, sores, and redness. If you cannot see the bottom of your feet, use a mirror or ask someone for help.  Do not cut corns or calluses or try to remove them with medicine.  If you  find a minor scrape, cut, or break in the skin on your feet, keep it and the skin around it clean and dry. You Schlick clean these areas with mild soap and water. Do not clean the area with peroxide, alcohol, or iodine.  If you have a wound, scrape, corn, or callus on your foot, look at it several times a day to make sure it is healing and not infected. Check for: ? Redness, swelling, or pain. ? Fluid or blood. ? Warmth. ? Pus or a bad smell. General instructions  Do not cross your legs. This Witting decrease blood flow to your feet.  Do not use heating pads or hot water bottles on your feet. They Lizana burn your skin. If you have lost feeling in your feet or legs, you Zeman not know this is happening until it is too late.  Protect your feet from hot and cold by wearing shoes, such as at the beach or on hot pavement.  Schedule a complete foot exam at least once a year (annually) or more often if you have foot problems. If you have foot problems, report any cuts, sores, or bruises to your health care provider immediately. Contact a health care provider if:  You have a medical condition that increases your risk of infection and you have any cuts, sores, or bruises on your feet.  You have an injury that is not   healing.  You have redness on your legs or feet.  You feel burning or tingling in your legs or feet.  You have pain or cramps in your legs and feet.  Your legs or feet are numb.  Your feet always feel cold.  You have pain around a toenail. Get help right away if:  You have a wound, scrape, corn, or callus on your foot and: ? You have pain, swelling, or redness that gets worse. ? You have fluid or blood coming from the wound, scrape, corn, or callus. ? Your wound, scrape, corn, or callus feels warm to the touch. ? You have pus or a bad smell coming from the wound, scrape, corn, or callus. ? You have a fever. ? You have a red line going up your leg. Summary  Check your feet every day  for cuts, sores, red spots, swelling, and blisters.  Moisturize feet and legs daily.  Wear shoes that fit properly and have enough cushioning.  If you have foot problems, report any cuts, sores, or bruises to your health care provider immediately.  Schedule a complete foot exam at least once a year (annually) or more often if you have foot problems. This information is not intended to replace advice given to you by your health care provider. Make sure you discuss any questions you have with your health care provider. Document Released: 04/17/2000 Document Revised: 06/02/2017 Document Reviewed: 05/22/2016 Elsevier Interactive Patient Education  2019 [employer] Inc.  Onychomycosis/Fungal Toenails  WHAT IS IT? An infection that lies within the keratin of your nail plate that is caused by a fungus.  WHY ME? Fungal infections affect all ages, sexes, races, and creeds.  There Supinski be many factors that predispose you to a fungal infection such as age, coexisting medical conditions such as diabetes, or an autoimmune disease; stress, medications, fatigue, genetics, etc.  Bottom line: fungus thrives in a warm, moist environment and your shoes offer such a location.  IS IT CONTAGIOUS? Theoretically, yes.  You do not want to share shoes, nail clippers or files with someone who has fungal toenails.  Walking around barefoot in the same room or sleeping in the same bed is unlikely to transfer the organism.  It is important to realize, however, that fungus can spread easily from one nail to the next on the same foot.  HOW DO WE TREAT THIS?  There are several ways to treat this condition.  Treatment Salas depend on many factors such as age, medications, pregnancy, liver and kidney conditions, etc.  It is best to ask your doctor which options are available to you.  1. No treatment.   Unlike many other medical concerns, you can live with this condition.  However for many people this can be a painful condition and  Fagin lead to ingrown toenails or a bacterial infection.  It is recommended that you keep the nails cut short to help reduce the amount of fungal nail. 2. Topical treatment.  These range from herbal remedies to prescription strength nail lacquers.  About 40-50% effective, topicals require twice daily application for approximately 9 to 12 months or until an entirely new nail has grown out.  The most effective topicals are medical grade medications available through physicians offices. 3. Oral antifungal medications.  With an 80-90% cure rate, the most common oral medication requires 3 to 4 months of therapy and stays in your system for a year as the new nail grows out.  Oral antifungal medications do require   blood work to make sure it is a safe drug for you.  A liver function panel will be performed prior to starting the medication and after the first month of treatment.  It is important to have the blood work performed to avoid any harmful side effects.  In general, this medication safe but blood work is required. 4. Laser Therapy.  This treatment is performed by applying a specialized laser to the affected nail plate.  This therapy is noninvasive, fast, and non-painful.  It is not covered by insurance and is therefore, out of pocket.  The results have been very good with a 80-95% cure rate.  The [facility] is the only practice in the area to offer this therapy. 5. Permanent Nail Avulsion.  Removing the entire nail so that a new nail will not grow back. 

## 2018-10-25 NOTE — ED Notes (Signed)
Patient in radiology

## 2018-10-25 NOTE — ED Notes (Signed)
Dr. Billy Fischer and [place] Cer[place], [place] at bedside, drawing fluid off patients left knee.

## 2018-10-25 NOTE — H&P (Signed)
History and Physical   [facility] [place] [facility] [place] Admission History and Physical [employer], D.O.    Patient Name: [name] MR#: [id number] Date of Birth: 06/01/1929 Date of Admission: 10/25/2018  Referring MD/NP/PA: PA Ward Primary Care Physician: [name], MD  Chief Complaint:  Chief Complaint  Patient presents with  . Fall    HPI: [name] is a 85 y.o. female with a known history of multiple medical problems including diabetes, hypertension, hyperlipidemia, GERD presents to the emergency department for evaluation of left knee pain status post mechanical fall.  Patient was in a usual state of health until she was walking to the bathroom when she tripped and fell landing on her knees.  She did have head trauma but no loss of consciousness.  She complains of left knee pain, difficulty with ambulation.  Patient denies any preceding symptoms such as dizziness, lightheadedness, shortness of breath, palpitations..  Patient denies fevers/chills, weakness, dizziness, chest pain, shortness of breath, N/V/C/D, abdominal pain, dysuria/frequency, changes in mental status.   EMS/ED Course: Patient received fentanyl and knee immobilizer. Medical admission has been requested for further management of intractable pain and gait instability.  Patient lives alone.  Review of Systems:  CONSTITUTIONAL: No fever/chills, fatigue, weakness, weight gain/loss, headache. EYES: No blurry or double vision. ENT: No tinnitus, postnasal drip, redness or soreness of the oropharynx. RESPIRATORY: No cough, dyspnea, wheeze.  No hemoptysis.  CARDIOVASCULAR: No chest pain, palpitations, syncope, orthopnea. No lower extremity edema.  GASTROINTESTINAL: No nausea, vomiting, abdominal pain, diarrhea, constipation.  No hematemesis, melena or hematochezia. GENITOURINARY: No dysuria, frequency, hematuria. ENDOCRINE: No polyuria or nocturia. No heat or cold intolerance. HEMATOLOGY: No  anemia, bruising, bleeding. INTEGUMENTARY: No rashes, ulcers, lesions. MUSCULOSKELETAL: Positive left knee pain as above no arthritis, gout, dyspnea. NEUROLOGIC: No numbness, tingling, ataxia, seizure-type activity, weakness. PSYCHIATRIC: No anxiety, depression, insomnia.   Past Medical History:  Diagnosis Date  . Anemia   . Anxiety   . Breast cancer ([place]) 2014   right breast  . Cancer of right breast ([place]) 12/26/12   right breast 12:00 o'clock, DCIS  . Chronic cough   . Chronic facial pain   . Chronic foot pain   . Complication of anesthesia    Sore jaw; could not chew or move mouth  . Convulsions/seizures ([place]) 10/16/2014  . Diabetes mellitus    type 2 niddm x 20 years  . Dyslipidemia   . Ejection fraction   . GERD (gastroesophageal reflux disease)   . Hammer toe    bilateral  . History of colonic polyps   . HTN (hypertension)   . Hx of radiation therapy 03/07/13- 03/29/13   right breast 4250 cGy 17 sessions  . Hyperlipidemia   . Melanoma ([place])   . Metatarsal bone fracture 2014  . Multiple drug allergies   . Obesity   . Osteoarthritis   . Palpitations   . Personal history of radiation therapy 2014  . Seasonal allergies   . Skin cancer   . Tremor, essential 08/18/2016  . Vitamin B12 deficiency     Past Surgical History:  Procedure Laterality Date  . ABDOMINAL HYSTERECTOMY    . BRAIN SURGERY    . BREAST BIOPSY Right 01/24/2013   Procedure: RE-EXCICION OF BREAST CANCER, ANTERIOR MARGINS;  Surgeon: Edward Jolly, MD;  Location: [facility];  Service: General;  Laterality: Right;  . BREAST LUMPECTOMY Right 2014  . BREAST LUMPECTOMY WITH NEEDLE LOCALIZATION Right 01/17/2013   Procedure:  BREAST LUMPECTOMY WITH NEEDLE LOCALIZATION;  Surgeon: Edward Jolly, MD;  Location: [facility];  Service: General;  Laterality: Right;  . BUNIONECTOMY    . CATARACT EXTRACTION W/ INTRAOCULAR LENS IMPLANT Right   . CHOLECYSTECTOMY    . COLONOSCOPY    . CRANIOTOMY Right 10/18/2013    Procedure: CRANIOTOMY TUMOR EXCISION;  Surgeon: Floyce Stakes, MD;  Location: [facility];  Service: Neurosurgery;  Laterality: Right;  . EYE SURGERY    . HERNIA REPAIR    . KNEE ARTHROSCOPY Bilateral   . POLYPECTOMY     small adenomatous  . ULNAR TUNNEL RELEASE       reports that she has never smoked. She has never used smokeless tobacco. She reports that she does not drink alcohol or use drugs.  Allergies  Allergen Reactions  . Bystolic [Nebivolol Hcl] Other (See Comments)    "extreme weakness, heaviness in legs & arms, swelling in legs/arms/face, swollen abdomen, pain in bladder, feet pain, soreness in chest"  . Cholestyramine     "itching rash on stomach, bloated, nausea, vomiting, sleeplessness, extreme pain in arms"  . Hydrazine Yellow [Tartrazine] Other (See Comments)    "does not reduce high blood pressure, pain in arm, high pressure, felt like I was on verge of heart attack, really weak"  . Morphine Other (See Comments)    Feels morbid, weak, still in pain  . Niacin Palpitations    Fast heart beat  . Niaspan [Niacin Er] Palpitations and Other (See Comments)    "fast heart beat, high blood pressure"  . Norvasc [Amlodipine Besylate] Other (See Comments)    "extreme fluid retention/pain)  . Optivar [Azelastine Hcl] Photosensitivity  . Repaglinide Hives  . Sular [Nisoldipine Er] Other (See Comments)    "severe headaches, swelling eyes, hands, feet, shortness of breath, weak, flushed face, brain boiling, fluid retention, high blood sugar, nervous, heart fast beating"  . Tegretol [Carbamazepine] Other (See Comments)    Blood poisoning   . Telmisartan Other (See Comments)    "headache, difficulty urinating, high blood sugar, fluid retention"  . Cefdinir Swelling    Vaginal irritation, breathing,   . Clonidine Other (See Comments)    Dry mouth, fluid retention  . Clonidine Hydrochloride Other (See Comments)    Dry mouth, fluid retention  . Codeine Nausea And Vomiting   . Ezetimibe Other (See Comments)    Made weak  . Naproxen Other (See Comments)    Shrinks bladder  . Ziac [Bisoprolol-Hydrochlorothiazide] Other (See Comments)    "stopped urination"  . Elavil [Amitriptyline] Other (See Comments)    Gave Pt nightmares  . Hydralazine   . Iodine   . Keppra [Levetiracetam] Other (See Comments)    Shaking  . Lamictal [Lamotrigine]     itching  . Lyrica [Pregabalin] Swelling  . Pseudoephedrine   . Topamax [Topiramate]     Dry eyes  . Ace Inhibitors Other (See Comments)    unknown  . Actonel [Risedronate Sodium] Other (See Comments)    unknown  . Amlodipine Besylate Other (See Comments)    unknown  . Aspirin Other (See Comments)    unknown  . Atacand [Candesartan] Other (See Comments)    unknown  . Bextra [Valdecoxib] Other (See Comments)    unknown  . Bisoprolol-Hydrochlorothiazide Other (See Comments)    unknown  . Candesartan Cilexetil Other (See Comments)    unknown  . Cefadroxil Other (See Comments)    unknown  . Celecoxib Rash  . Hydrocodone Other (  See Comments)    unknown  . Hydrocodone-Acetaminophen Other (See Comments)    unknown  . Iodinated Diagnostic Agents Rash    "All over" "All over"  . Meloxicam Other (See Comments)    unknown  . Methylprednisolone Sodium Succinate Other (See Comments)    unknown  . Nabumetone Other (See Comments)    unknown  . Penicillins Other (See Comments)    unknown  . Pseudoephedrine-Guaifenesin Other (See Comments)    unknown  . Risedronate Sodium Other (See Comments)    unknown  . Rofecoxib Other (See Comments)    unknown  . Ru-Tuss [Chlorphen-Pse-Atrop-Hyos-Scop] Other (See Comments)    unknown  . Sulfonamide Derivatives Other (See Comments)    unknown  . Sulphur [Sulfur] Other (See Comments)    unknown  . Telithromycin Other (See Comments)    unknown  . Terfenadine Other (See Comments)    unknown  . Trandolapril-Verapamil Hcl Er Other (See Comments)    Headache, difficulty  urinating, high blood sugar, fluid retention  Pt is taking Tarka (trandolapril-verapamil) currently, but requests the medication stay in her allergy list  . Trandolapril-Verapamil Hcl Er Other (See Comments)    Headache, difficulty urinating, high blood sugar, fluid retention  Pt is taking Tarka (trandolapril-verapamil) currently, but requests the medication stay in her allergy list  . Valium [Diazepam] Other (See Comments)    Makes her mean and hyper    Family History  Problem Relation Age of Onset  . Heart disease Mother   . Osteoporosis Mother   . Diabetes Father   . Pancreatic cancer Father   . Colon cancer Other   . Bone cancer Sister   . Prostate cancer Brother   . Colon cancer Brother   . Rectal cancer Sister   . Thyroid disease Sister        benign goiter resected    Prior to Admission medications   Medication Sig Start Date End Date Taking? Authorizing Provider  Biotin 1000 MCG tablet Take 1,000 mcg by mouth daily.    Yes [provider]  Cholecalciferol 5000 units capsule Take 10,000 Units by mouth 2 (two) times a day.    Yes [provider]  cyanocobalamin (,VITAMIN B-12,) 1000 MCG/ML injection INJECT 1 ML INTRAMUSCULARLY EVERY 21 DAYS Patient taking differently: Inject 1,000 mcg into the muscle every 21 ( twenty-one) days.  06/18/16  Yes Hoyt Koch, MD  DiphenhydrAMINE HCl (BENADRYL ALLERGY PO) Take 1 tablet by mouth as needed.   Yes [provider]  empagliflozin (JARDIANCE) 10 MG TABS tablet Take 10 mg by mouth daily.   Yes [provider]  escitalopram (LEXAPRO) 10 MG tablet Take 10 mg by mouth daily.   Yes [provider]  famotidine (PEPCID) 20 MG tablet Take 20 mg by mouth daily.  07/25/18  Yes [provider]  insulin glargine (LANTUS) 100 UNIT/ML injection Inject 14 Units into the skin at bedtime.    Yes [provider]  levothyroxine (SYNTHROID) 75 MCG tablet Take 75 mcg by mouth daily  before breakfast.   Yes [provider]  lidocaine (LIDODERM) 5 % Place 1 patch onto the skin daily. Remove & Discard patch within 12 hours or as directed by MD Patient taking differently: Place 1-2 patches onto the skin daily as needed (pain). Remove & Discard patch within 12 hours or as directed by MD 01/25/15  Yes Hoyt Koch, MD  phenytoin (DILANTIN) 100 MG ER capsule 2 capsules at bedtime and 1  in the morning Patient taking differently: Take 1-2 mg by mouth 2 (two) times daily. Take 1 capsule (100 mg) in the morning and Take 2 capsules (200 mg) at bedtime 06/27/18  Yes Kathrynn Ducking, MD  PHENYTOIN INFATABS 50 MG tablet CHEW 1 TABLET DAILY Patient taking differently: Chew 50 mg by mouth daily. Take along with 100 mg tablet in the morning 07/25/18  Yes Kathrynn Ducking, MD  Polyethyl Glycol-Propyl Glycol 0.4-0.3 % SOLN Place 2 drops into both eyes 2 (two) times a day.    Yes [provider]  primidone (MYSOLINE) 50 MG tablet Take 2 tablets by mouth twice daily 07/14/18  Yes Kathrynn Ducking, MD  Probiotic Product (ALIGN) 4 MG CAPS Take 1 capsule by mouth 3 (three) times daily after meals.    Yes [provider]  saccharomyces boulardii (FLORASTOR) 250 MG capsule Take 250 mg by mouth 2 (two) times daily.   Yes [provider]  traMADol (ULTRAM) 50 MG tablet Take 50 mg by mouth every 6 (six) hours as needed.    Yes [provider]  trandolapril-verapamil (TARKA) 2-240 MG tablet Take 1 tablet by mouth daily.   Yes [provider]  ALPRAZolam (XANAX) 0.25 MG tablet Take 1 tablet (0.25 mg total) by mouth 3 (three) times daily as needed for anxiety. 07/25/18   Kathrynn Ducking, MD  amoxicillin-clavulanate (AUGMENTIN) 875-125 MG tablet Take 1 tablet by mouth 2 (two) times daily. 03/18/18   [provider]  Blood Glucose Monitoring Suppl (PRODIGY VOICE BLOOD GLUCOSE) w/Device KIT Use to check blood sugar 1 time per day. 10/18/15    Renato Shin, MD  furosemide (LASIX) 20 MG tablet Take 1 tablet (20 mg total) by mouth daily. Do not take until I call you about labs. Patient not taking: Reported on 10/25/2018 12/27/13   Marin Olp, MD  glucose blood test strip Use to check blood sugar 1 time per day. 10/22/15   Renato Shin, MD  insulin lispro (HUMALOG KWIKPEN) 100 UNIT/ML KiwkPen Inject 0.07 mLs (7 Units total) into the skin 3 (three) times daily with meals. And pen needles 1/day Patient not taking: Reported on 09/06/2018 05/24/17   Renato Shin, MD  Insulin Pen Needle 32G X 4 MM MISC Used to inject insulin 3x daily 11/19/16   Renato Shin, MD  levothyroxine (SYNTHROID, LEVOTHROID) 50 MCG tablet TAKE 1 TABLET DAILY BEFORE BREAKFAST Patient not taking: Reported on 10/25/2018 06/14/16   Renato Shin, MD  loperamide (IMODIUM) 2 MG capsule Take 2 mg by mouth as needed for diarrhea or loose stools.    [provider]  oxyCODONE-acetaminophen (PERCOCET/ROXICET) 5-325 MG tablet Take 1-2 tablets by mouth every 4 (four) hours as needed for severe pain. 10/25/18   Ward, Ozella Almond, PA-C    Physical Exam: Vitals:   10/25/18 2245 10/25/18 2315 10/25/18 2330 10/25/18 2345  BP: (!) 190/94 (!) 189/93 (!) 171/83 (!) 164/85  Pulse: (!) 54 (!) 58 66 73  Resp: 19 (!) _0 Temp:      TempSrc:      SpO2: 98% 98% 98% 99%  Weight:      Height:        GENERAL: 85 y.o.-year-old female patient, well-developed, well-nourished lying in the bed in no acute distress.  Pleasant and cooperative.   HEENT: Head atraumatic, normocephalic. Pupils equal. Mucus membranes moist. NECK: Supple. No JVD. CHEST: Normal breath sounds bilaterally. No wheezing, rales, rhonchi or crackles. No use of accessory  muscles of respiration.  No reproducible chest wall tenderness.  CARDIOVASCULAR: S1, S2 normal. No murmurs, rubs, or gallops. Cap refill <2 seconds. Pulses intact distally.  ABDOMEN: Soft, nondistended, nontender. No rebound, guarding,  rigidity. Normoactive bowel sounds present in all four quadrants.  EXTREMITIES: Left knee tenderness and decreased ROM 2/2 pain.  Immobilized.  No pedal edema, cyanosis, or clubbing. No calf tenderness or Homan's sign.  NEUROLOGIC: The patient is alert and oriented x 3. Cranial nerves II through XII are grossly intact with no focal sensorimotor deficit. PSYCHIATRIC:  Normal affect, mood, thought content. SKIN: Warm, dry, and intact without obvious rash, lesion, or ulcer.    Labs on Admission:  CBC: No results for input(s): WBC, NEUTROABS, HGB, HCT, MCV, PLT in the last 168 hours. Basic Metabolic Panel: No results for input(s): NA, K, CL, CO2, GLUCOSE, BUN, CREATININE, CALCIUM, MG, PHOS in the last 168 hours. GFR: CrCl cannot be calculated (Patient's most recent lab result is older than the maximum 21 days allowed.). Liver Function Tests: No results for input(s): AST, ALT, ALKPHOS, BILITOT, PROT, ALBUMIN in the last 168 hours. No results for input(s): LIPASE, AMYLASE in the last 168 hours. No results for input(s): AMMONIA in the last 168 hours. Coagulation Profile: No results for input(s): INR, PROTIME in the last 168 hours. Cardiac Enzymes: No results for input(s): CKTOTAL, CKMB, CKMBINDEX, TROPONINI in the last 168 hours. BNP (last 3 results) No results for input(s): PROBNP in the last 8760 hours. HbA1C: No results for input(s): HGBA1C in the last 72 hours. CBG: No results for input(s): GLUCAP in the last 168 hours. Lipid Profile: No results for input(s): CHOL, HDL, LDLCALC, TRIG, CHOLHDL, LDLDIRECT in the last 72 hours. Thyroid Function Tests: No results for input(s): TSH, T4TOTAL, FREET4, T3FREE, THYROIDAB in the last 72 hours. Anemia Panel: No results for input(s): VITAMINB12, FOLATE, FERRITIN, TIBC, IRON, RETICCTPCT in the last 72 hours. Urine analysis:    Component Value Date/Time   COLORURINE CANCELED 03/19/2015 1154   APPEARANCEUR CANCELED 03/19/2015 1154   LABSPEC  1.015 03/25/2015 1755   PHURINE 5.0 03/25/2015 1755   GLUCOSEU 100 (A) 03/25/2015 1755   GLUCOSEU NEGATIVE 12/28/2013 1036   HGBUR TRACE (A) 03/25/2015 1755   HGBUR trace-intact 06/29/2007 1304   BILIRUBINUR negative 01/19/2017 1049   KETONESUR TRACE (A) 03/25/2015 1755   PROTEINUR negative 01/19/2017 1049   PROTEINUR 100 (A) 03/25/2015 1755   UROBILINOGEN 0.2 01/19/2017 1049   UROBILINOGEN 1.0 03/25/2015 1755   NITRITE negative 01/19/2017 1049   NITRITE POSITIVE (A) 03/25/2015 1755   LEUKOCYTESUR Trace (A) 01/19/2017 1049   Sepsis Labs: _0 (procalcitonin:4,lacticidven:4) )No results found for this or any previous visit (from the past 240 hour(s)).   Radiological Exams on Admission: Ct Head Wo Contrast  Result Date: 10/25/2018 CLINICAL DATA:  85 year old and fall. EXAM: CT HEAD WITHOUT CONTRAST CT CERVICAL SPINE WITHOUT CONTRAST TECHNIQUE: Multidetector CT imaging of the head and cervical spine was performed following the standard protocol without intravenous contrast. Multiplanar CT image reconstructions of the cervical spine were also generated. COMPARISON:  08/19/2015 FINDINGS: CT HEAD FINDINGS Brain: Encephalomalacia in the right frontal lobe appears stable. Negative for acute hemorrhage, mass lesion, midline shift, hydrocephalus or large new infarct. Evidence for an old insult or infarct in the anterior limb of the left internal capsule. Vascular: No hyperdense vessel or unexpected calcification. Skull: Old right frontal craniotomy.  No acute bone abnormality. Sinuses/Orbits: Small amount of fluid in left mastoid air cells. Other: None CT CERVICAL SPINE  FINDINGS Alignment: Mild anterolisthesis at C5-C6. Skull base and vertebrae: No acute fracture. No primary bone lesion or focal pathologic process. Soft tissues and spinal canal: No prevertebral fluid or swelling. No visible canal hematoma. Again noted is a large left thyroid nodule measuring at least 2.3 cm. Similar finding was  present in 2017. Disc levels: Posterior disc space narrowing at C4-C5. Disc space narrowing at C6-C7. Bilateral facet arthropathy. Upper chest: Negative. Other: None IMPRESSION: 1. No acute intracranial abnormality. 2. Stable encephalomalacia in the right frontal lobe and previous right craniotomy. 3. No acute abnormality in cervical spine. 4. Degenerative changes in the cervical spine. 5. Persistent left thyroid nodule. Electronically Signed   By: Markus Daft M.D.   On: 10/25/2018 20:14   Ct Cervical Spine Wo Contrast  Result Date: 10/25/2018 CLINICAL DATA:  85 year old and fall. EXAM: CT HEAD WITHOUT CONTRAST CT CERVICAL SPINE WITHOUT CONTRAST TECHNIQUE: Multidetector CT imaging of the head and cervical spine was performed following the standard protocol without intravenous contrast. Multiplanar CT image reconstructions of the cervical spine were also generated. COMPARISON:  08/19/2015 FINDINGS: CT HEAD FINDINGS Brain: Encephalomalacia in the right frontal lobe appears stable. Negative for acute hemorrhage, mass lesion, midline shift, hydrocephalus or large new infarct. Evidence for an old insult or infarct in the anterior limb of the left internal capsule. Vascular: No hyperdense vessel or unexpected calcification. Skull: Old right frontal craniotomy.  No acute bone abnormality. Sinuses/Orbits: Small amount of fluid in left mastoid air cells. Other: None CT CERVICAL SPINE FINDINGS Alignment: Mild anterolisthesis at C5-C6. Skull base and vertebrae: No acute fracture. No primary bone lesion or focal pathologic process. Soft tissues and spinal canal: No prevertebral fluid or swelling. No visible canal hematoma. Again noted is a large left thyroid nodule measuring at least 2.3 cm. Similar finding was present in 2017. Disc levels: Posterior disc space narrowing at C4-C5. Disc space narrowing at C6-C7. Bilateral facet arthropathy. Upper chest: Negative. Other: None IMPRESSION: 1. No acute intracranial abnormality.  2. Stable encephalomalacia in the right frontal lobe and previous right craniotomy. 3. No acute abnormality in cervical spine. 4. Degenerative changes in the cervical spine. 5. Persistent left thyroid nodule. Electronically Signed   By: Markus Daft M.D.   On: 10/25/2018 20:14   Dg Knee Complete 4 Views Left  Result Date: 10/25/2018 CLINICAL DATA:  [place] and injured left knee. EXAM: LEFT KNEE - COMPLETE 4+ VIEW COMPARISON:  04/04/2014 FINDINGS: There is a nondisplaced fracture through the mid pole of the patella with an associated large joint effusion. No femur, tibia or fibula fracture is identified Tricompartmental degenerative changes noted with joint space narrowing, spurring and chondrocalcinosis. IMPRESSION: 1. Nondisplaced transverse fracture through the mid patella. 2. No femur, tibia or fibular fractures are identified. 3. Large knee joint effusion. Electronically Signed   By: Marijo Sanes M.D.   On: 10/25/2018 20:17   Dg Hand Complete Right  Result Date: 10/25/2018 CLINICAL DATA:  [place] and injured right hand. EXAM: RIGHT HAND - COMPLETE 3+ VIEW COMPARISON:  Radiographs 03/13/2014 FINDINGS: Moderate degenerative changes involving the DIP and PIP joints of the fingers with findings suggestive of erosive osteoarthritis. Diffuse osteopenia. Remote healed fracture involving the base of the fifth metacarpal. No definite acute hand or wrist fractures are identified. IMPRESSION: No acute fractures. Evidence of remote trauma. Electronically Signed   By: Marijo Sanes M.D.   On: 10/25/2018 20:21   Dg Hip Unilat W Or Wo Pelvis 2-3 Views Left  Result Date: 10/25/2018  CLINICAL DATA:  [place]. EXAM: DG HIP (WITH OR WITHOUT PELVIS) 2-3V LEFT COMPARISON:  None. FINDINGS: Both hips are normally located. No hip fracture or plain film evidence of AVN. The pubic symphysis and SI joints are intact. No pelvic fractures or bone lesions. IMPRESSION: No acute bony findings. Electronically Signed   By: Marijo Sanes M.D.    On: 10/25/2018 20:18    Assessment/Plan  This is a 85 y.o. female with a history of diabetes, hypertension, hyperlipidemia, GERD now being admitted with:  #. Intractable left knee pain 2/2 fractured patella - Admit observation - Pain control - Fentanyl with caution - PT eval - lives alone, [place] need help  #. H/o Diabetes - Accuchecks achs with RISS coverage - Heart healthy, carb controlled diet   #. H/O HTN - Continue home meds  Admission status: Obs IV Fluids: HL Diet/Nutrition: Heart healthy, carb controlled Consults called: None  DVT Px: Lovenox, SCDs and early ambulation. Code Status: Full Code  Disposition Plan: To be determined.   All the records are reviewed and case discussed with ED provider. Management plans discussed with the patient and/or family who express understanding and agree with plan of care.  [employer] D.O. on 10/25/2018 at 11:59 PM CC: Primary care physician; [name], MD   10/25/2018, 11:59 PM

## 2018-10-25 NOTE — ED Notes (Signed)
Bed: WA17 Expected date:  Expected time:  Means of arrival:  Comments: EMS fall

## 2018-10-25 NOTE — ED Provider Notes (Signed)
[facility] DEPT Provider Note   CSN: [id number] Arrival date & time: 10/25/18  1754    History   Chief Complaint Chief Complaint  Patient presents with   Fall    HPI [name] is a 85 y.o. female.     The history is provided by the patient and medical records. No language interpreter was used.    [name] is a 85 y.o. female  with a PMH as listed below who presents to the Emergency Department for evaluation after mechanical fall just prior to arrival. Patient states that she was trying to walk to bathroom when she tripped over her own feet and fell. She hit her head on the doorframe. No LOC. No n/v.  Her main complaint is worsening left knee pain.  Feels as if her knee twisted oddly and then struck the ground.  Followed by Dr. Theda Sers of orthopedics.  Given 150 mcg of fentanyl by EMS in route.    Past Medical History:  Diagnosis Date   Anemia    Anxiety    Breast cancer ([place]) 2014   right breast   Cancer of right breast ([place]) 12/26/12   right breast 12:00 o'clock, DCIS   Chronic cough    Chronic facial pain    Chronic foot pain    Complication of anesthesia    Sore jaw; could not chew or move mouth   Convulsions/seizures ([place]) 10/16/2014   Diabetes mellitus    type 2 niddm x 20 years   Dyslipidemia    Ejection fraction    GERD (gastroesophageal reflux disease)    Hammer toe    bilateral   History of colonic polyps    HTN (hypertension)    Hx of radiation therapy 03/07/13- 03/29/13   right breast 4250 cGy 17 sessions   Hyperlipidemia    Melanoma ([place])    Metatarsal bone fracture 2014   Multiple drug allergies    Obesity    Osteoarthritis    Palpitations    Personal history of radiation therapy 2014   Seasonal allergies    Skin cancer    Tremor, essential 08/18/2016   Vitamin B12 deficiency     Patient Active Problem List   Diagnosis Date Noted   Acquired hypothyroidism 08/25/2017   H/O  excision of tumor of brain meninges 08/25/2017   Hammer toe 08/25/2017   History of breast cancer 08/25/2017   Nontoxic thyroid nodule 08/25/2017   Osteopetrosis 08/25/2017   Dysuria 01/19/2017   Vitamin D deficiency 11/18/2016   Tremor, essential 08/18/2016   Convulsions/seizures ([place]) 10/16/2014   Brain tumor (benign) ([place]) 10/18/2013   Subdural hemorrhage ([place]) 10/09/2013   Anxiety 10/09/2013   Compression fracture of T12 vertebra ([place]) 10/09/2013   Ejection fraction    Multiple thyroid nodules 09/05/2013   Syncope 08/27/2013   Carotid artery disease ([place]) 08/27/2013   Abnormal thyroid ultrasound 08/27/2013   Cancer of right breast ([place])    Cancer of central portion of female breast ([place]) 12/30/2012   Osteoporosis 03/06/2010   ASTHMA 03/05/2009   IRRITABLE BOWEL SYNDROME  diarrhea type 03/21/2008   ANEMIA-NOS 12/16/2006   GERD 12/16/2006   Diabetes ([place]) 10/29/2006   Hyperlipidemia 10/29/2006   Essential hypertension 10/29/2006   Allergic rhinitis, cause unspecified 10/29/2006   OSTEOARTHRITIS 10/29/2006    Past Surgical History:  Procedure Laterality Date   ABDOMINAL HYSTERECTOMY     BRAIN SURGERY     BREAST BIOPSY Right 01/24/2013   Procedure: RE-EXCICION  OF BREAST CANCER, ANTERIOR MARGINS;  Surgeon: Edward Jolly, MD;  Location: [facility];  Service: General;  Laterality: Right;   BREAST LUMPECTOMY Right 2014   BREAST LUMPECTOMY WITH NEEDLE LOCALIZATION Right 01/17/2013   Procedure: BREAST LUMPECTOMY WITH NEEDLE LOCALIZATION;  Surgeon: Edward Jolly, MD;  Location: [facility];  Service: General;  Laterality: Right;   BUNIONECTOMY     CATARACT EXTRACTION W/ INTRAOCULAR LENS IMPLANT Right    CHOLECYSTECTOMY     COLONOSCOPY     CRANIOTOMY Right 10/18/2013   Procedure: CRANIOTOMY TUMOR EXCISION;  Surgeon: Floyce Stakes, MD;  Location: [facility];  Service: Neurosurgery;  Laterality: Right;   EYE SURGERY     HERNIA REPAIR      KNEE ARTHROSCOPY Bilateral    POLYPECTOMY     small adenomatous   ULNAR TUNNEL RELEASE       OB History   No obstetric history on file.      Home Medications    Prior to Admission medications   Medication Sig Start Date End Date Taking? Authorizing Provider  Biotin 1000 MCG tablet Take 1,000 mcg by mouth daily.    Yes [provider]  Cholecalciferol 5000 units capsule Take 10,000 Units by mouth 2 (two) times a day.    Yes [provider]  cyanocobalamin (,VITAMIN B-12,) 1000 MCG/ML injection INJECT 1 ML INTRAMUSCULARLY EVERY 21 DAYS Patient taking differently: Inject 1,000 mcg into the muscle every 21 ( twenty-one) days.  06/18/16  Yes Hoyt Koch, MD  DiphenhydrAMINE HCl (BENADRYL ALLERGY PO) Take 1 tablet by mouth as needed.   Yes [provider]  empagliflozin (JARDIANCE) 10 MG TABS tablet Take 10 mg by mouth daily.   Yes [provider]  escitalopram (LEXAPRO) 10 MG tablet Take 10 mg by mouth daily.   Yes [provider]  famotidine (PEPCID) 20 MG tablet Take 20 mg by mouth daily.  07/25/18  Yes [provider]  insulin glargine (LANTUS) 100 UNIT/ML injection Inject 14 Units into the skin at bedtime.    Yes [provider]  levothyroxine (SYNTHROID) 75 MCG tablet Take 75 mcg by mouth daily before breakfast.   Yes [provider]  lidocaine (LIDODERM) 5 % Place 1 patch onto the skin daily. Remove & Discard patch within 12 hours or as directed by MD Patient taking differently: Place 1-2 patches onto the skin daily as needed (pain). Remove & Discard patch within 12 hours or as directed by MD 01/25/15  Yes Hoyt Koch, MD  phenytoin (DILANTIN) 100 MG ER capsule 2 capsules at bedtime and 1 in the morning Patient taking differently: Take 1-2 mg by mouth 2 (two) times daily. Take 1 capsule (100 mg) in the morning and Take 2 capsules (200 mg) at bedtime 06/27/18  Yes Kathrynn Ducking, MD    PHENYTOIN INFATABS 50 MG tablet CHEW 1 TABLET DAILY Patient taking differently: Chew 50 mg by mouth daily. Take along with 100 mg tablet in the morning 07/25/18  Yes Kathrynn Ducking, MD  Polyethyl Glycol-Propyl Glycol 0.4-0.3 % SOLN Place 2 drops into both eyes 2 (two) times a day.    Yes [provider]  primidone (MYSOLINE) 50 MG tablet Take 2 tablets by mouth twice daily 07/14/18  Yes Kathrynn Ducking, MD  Probiotic Product (ALIGN) 4 MG CAPS Take 1 capsule by mouth 3 (three) times daily after meals.    Yes [provider]  saccharomyces boulardii (FLORASTOR) 250 MG capsule  Take 250 mg by mouth 2 (two) times daily.   Yes [provider]  traMADol (ULTRAM) 50 MG tablet Take 50 mg by mouth every 6 (six) hours as needed.    Yes [provider]  trandolapril-verapamil (TARKA) 2-240 MG tablet Take 1 tablet by mouth daily.   Yes [provider]  ALPRAZolam (XANAX) 0.25 MG tablet Take 1 tablet (0.25 mg total) by mouth 3 (three) times daily as needed for anxiety. 07/25/18   Kathrynn Ducking, MD  amoxicillin-clavulanate (AUGMENTIN) 875-125 MG tablet Take 1 tablet by mouth 2 (two) times daily. 03/18/18   [provider]  Blood Glucose Monitoring Suppl (PRODIGY VOICE BLOOD GLUCOSE) w/Device KIT Use to check blood sugar 1 time per day. 10/18/15   Renato Shin, MD  furosemide (LASIX) 20 MG tablet Take 1 tablet (20 mg total) by mouth daily. Do not take until I call you about labs. Patient not taking: Reported on 10/25/2018 12/27/13   Marin Olp, MD  glucose blood test strip Use to check blood sugar 1 time per day. 10/22/15   Renato Shin, MD  insulin lispro (HUMALOG KWIKPEN) 100 UNIT/ML KiwkPen Inject 0.07 mLs (7 Units total) into the skin 3 (three) times daily with meals. And pen needles 1/day Patient not taking: Reported on 09/06/2018 05/24/17   Renato Shin, MD  Insulin Pen Needle 32G X 4 MM MISC Used to inject insulin 3x daily 11/19/16   Renato Shin, MD  levothyroxine (SYNTHROID, LEVOTHROID) 50 MCG tablet TAKE 1 TABLET DAILY BEFORE BREAKFAST Patient not taking: Reported on 10/25/2018 06/14/16   Renato Shin, MD  loperamide (IMODIUM) 2 MG capsule Take 2 mg by mouth as needed for diarrhea or loose stools.    [provider]  oxyCODONE-acetaminophen (PERCOCET/ROXICET) 5-325 MG tablet Take 1-2 tablets by mouth every 4 (four) hours as needed for severe pain. 10/25/18   Denice Cardon, Ozella Almond, PA-C    Family History Family History  Problem Relation Age of Onset   Heart disease Mother    Osteoporosis Mother    Diabetes Father    Pancreatic cancer Father    Colon cancer Other    Bone cancer Sister    Prostate cancer Brother    Colon cancer Brother    Rectal cancer Sister    Thyroid disease Sister        benign goiter resected    Social History Social History   Tobacco Use   Smoking status: Never Smoker   Smokeless tobacco: Never Used  Substance Use Topics   Alcohol use: No   Drug use: No     Allergies   Bystolic [nebivolol hcl], Cholestyramine, Hydrazine yellow [tartrazine], Morphine, Niacin, Niaspan [niacin er], Norvasc [amlodipine besylate], Optivar [azelastine hcl], Repaglinide, Sular [nisoldipine er], Tegretol [carbamazepine], Telmisartan, Cefdinir, Clonidine, Clonidine hydrochloride, Codeine, Ezetimibe, Naproxen, Ziac [bisoprolol-hydrochlorothiazide], Elavil [amitriptyline], Hydralazine, Iodine, Keppra [levetiracetam], Lamictal [lamotrigine], Lyrica [pregabalin], Pseudoephedrine, Topamax [topiramate], Ace inhibitors, Actonel [risedronate sodium], Amlodipine besylate, Aspirin, Atacand [candesartan], Bextra [valdecoxib], Bisoprolol-hydrochlorothiazide, Candesartan cilexetil, Cefadroxil, Celecoxib, Hydrocodone, Hydrocodone-acetaminophen, Iodinated diagnostic agents, Meloxicam, Methylprednisolone sodium succinate, Nabumetone, Penicillins, Pseudoephedrine-guaifenesin, Risedronate sodium, Rofecoxib, Ru-tuss  [chlorphen-pse-atrop-hyos-scop], Sulfonamide derivatives, Sulphur [sulfur], Telithromycin, Terfenadine, Trandolapril-verapamil hcl er, Trandolapril-verapamil hcl er, and Valium [diazepam]   Review of Systems Review of Systems  Musculoskeletal: Positive for arthralgias, joint swelling and myalgias.  Skin: Negative for color change and wound.  Neurological: Positive for headaches. Negative for weakness and numbness.  All other systems reviewed and are negative.    Physical Exam Updated Vital Signs BP Marland Kitchen)  164/85    Pulse 73    Temp 97.9 F (36.6 C) (Oral)    Resp 18    Ht 5' 7.5" (1.715 m)    Wt 61.7 kg    SpO2 99%    BMI 20.99 kg/m   Physical Exam Vitals signs and nursing note reviewed.  Constitutional:      General: She is not in acute distress.    Appearance: She is well-developed.  HENT:     Head: Normocephalic.     Comments: Hematoma to left posterior head. Neck:     Musculoskeletal: Neck supple.     Comments: C-collar in place.  No midline tenderness appreciated. Cardiovascular:     Rate and Rhythm: Normal rate and regular rhythm.     Heart sounds: Normal heart sounds. No murmur.  Pulmonary:     Effort: Pulmonary effort is normal. No respiratory distress.     Breath sounds: Normal breath sounds.  Abdominal:     General: There is no distension.     Palpations: Abdomen is soft.     Tenderness: There is no abdominal tenderness.  Musculoskeletal:     Comments: Exquisite, diffuse tenderness to the left knee.  Joint effusion appreciated.  Decreased range of motion to the knee joint.  No tenderness to the hip or ankle. 2+ distal pulses and sensation intact x4.  Skin:    General: Skin is warm and dry.  Neurological:     General: No focal deficit present.     Mental Status: She is alert and oriented to person, place, and time.     Cranial Nerves: No cranial nerve deficit.      ED Treatments / Results  Labs (all labs ordered are listed, but only abnormal results are  displayed) Labs Reviewed  CBC WITH DIFFERENTIAL/PLATELET  BASIC METABOLIC PANEL    EKG None  Radiology Ct Head Wo Contrast  Result Date: 10/25/2018 CLINICAL DATA:  85 year old and fall. EXAM: CT HEAD WITHOUT CONTRAST CT CERVICAL SPINE WITHOUT CONTRAST TECHNIQUE: Multidetector CT imaging of the head and cervical spine was performed following the standard protocol without intravenous contrast. Multiplanar CT image reconstructions of the cervical spine were also generated. COMPARISON:  08/19/2015 FINDINGS: CT HEAD FINDINGS Brain: Encephalomalacia in the right frontal lobe appears stable. Negative for acute hemorrhage, mass lesion, midline shift, hydrocephalus or large new infarct. Evidence for an old insult or infarct in the anterior limb of the left internal capsule. Vascular: No hyperdense vessel or unexpected calcification. Skull: Old right frontal craniotomy.  No acute bone abnormality. Sinuses/Orbits: Small amount of fluid in left mastoid air cells. Other: None CT CERVICAL SPINE FINDINGS Alignment: Mild anterolisthesis at C5-C6. Skull base and vertebrae: No acute fracture. No primary bone lesion or focal pathologic process. Soft tissues and spinal canal: No prevertebral fluid or swelling. No visible canal hematoma. Again noted is a large left thyroid nodule measuring at least 2.3 cm. Similar finding was present in 2017. Disc levels: Posterior disc space narrowing at C4-C5. Disc space narrowing at C6-C7. Bilateral facet arthropathy. Upper chest: Negative. Other: None IMPRESSION: 1. No acute intracranial abnormality. 2. Stable encephalomalacia in the right frontal lobe and previous right craniotomy. 3. No acute abnormality in cervical spine. 4. Degenerative changes in the cervical spine. 5. Persistent left thyroid nodule. Electronically Signed   By: Markus Daft M.D.   On: 10/25/2018 20:14   Ct Cervical Spine Wo Contrast  Result Date: 10/25/2018 CLINICAL DATA:  85 year old and fall. EXAM: CT HEAD  WITHOUT  CONTRAST CT CERVICAL SPINE WITHOUT CONTRAST TECHNIQUE: Multidetector CT imaging of the head and cervical spine was performed following the standard protocol without intravenous contrast. Multiplanar CT image reconstructions of the cervical spine were also generated. COMPARISON:  08/19/2015 FINDINGS: CT HEAD FINDINGS Brain: Encephalomalacia in the right frontal lobe appears stable. Negative for acute hemorrhage, mass lesion, midline shift, hydrocephalus or large new infarct. Evidence for an old insult or infarct in the anterior limb of the left internal capsule. Vascular: No hyperdense vessel or unexpected calcification. Skull: Old right frontal craniotomy.  No acute bone abnormality. Sinuses/Orbits: Small amount of fluid in left mastoid air cells. Other: None CT CERVICAL SPINE FINDINGS Alignment: Mild anterolisthesis at C5-C6. Skull base and vertebrae: No acute fracture. No primary bone lesion or focal pathologic process. Soft tissues and spinal canal: No prevertebral fluid or swelling. No visible canal hematoma. Again noted is a large left thyroid nodule measuring at least 2.3 cm. Similar finding was present in 2017. Disc levels: Posterior disc space narrowing at C4-C5. Disc space narrowing at C6-C7. Bilateral facet arthropathy. Upper chest: Negative. Other: None IMPRESSION: 1. No acute intracranial abnormality. 2. Stable encephalomalacia in the right frontal lobe and previous right craniotomy. 3. No acute abnormality in cervical spine. 4. Degenerative changes in the cervical spine. 5. Persistent left thyroid nodule. Electronically Signed   By: Markus Daft M.D.   On: 10/25/2018 20:14   Dg Knee Complete 4 Views Left  Result Date: 10/25/2018 CLINICAL DATA:  [place] and injured left knee. EXAM: LEFT KNEE - COMPLETE 4+ VIEW COMPARISON:  04/04/2014 FINDINGS: There is a nondisplaced fracture through the mid pole of the patella with an associated large joint effusion. No femur, tibia or fibula fracture is  identified Tricompartmental degenerative changes noted with joint space narrowing, spurring and chondrocalcinosis. IMPRESSION: 1. Nondisplaced transverse fracture through the mid patella. 2. No femur, tibia or fibular fractures are identified. 3. Large knee joint effusion. Electronically Signed   By: Marijo Sanes M.D.   On: 10/25/2018 20:17   Dg Hand Complete Right  Result Date: 10/25/2018 CLINICAL DATA:  [place] and injured right hand. EXAM: RIGHT HAND - COMPLETE 3+ VIEW COMPARISON:  Radiographs 03/13/2014 FINDINGS: Moderate degenerative changes involving the DIP and PIP joints of the fingers with findings suggestive of erosive osteoarthritis. Diffuse osteopenia. Remote healed fracture involving the base of the fifth metacarpal. No definite acute hand or wrist fractures are identified. IMPRESSION: No acute fractures. Evidence of remote trauma. Electronically Signed   By: Marijo Sanes M.D.   On: 10/25/2018 20:21   Dg Hip Unilat W Or Wo Pelvis 2-3 Views Left  Result Date: 10/25/2018 CLINICAL DATA:  [place]. EXAM: DG HIP (WITH OR WITHOUT PELVIS) 2-3V LEFT COMPARISON:  None. FINDINGS: Both hips are normally located. No hip fracture or plain film evidence of AVN. The pubic symphysis and SI joints are intact. No pelvic fractures or bone lesions. IMPRESSION: No acute bony findings. Electronically Signed   By: Marijo Sanes M.D.   On: 10/25/2018 20:18    Procedures .Joint Aspiration/Arthrocentesis  Date/Time: 10/25/2018 11:29 PM Performed by: Clema Skousen, Ozella Almond, PA-C Authorized by: Amariah Kierstead, Ozella Almond, PA-C   Consent:    Consent obtained:  Verbal and written   Consent given by:  Patient   Risks discussed:  Bleeding, incomplete drainage, infection, nerve damage and pain   Alternatives discussed:  No treatment Universal protocol:    Procedure explained and questions answered to patient or proxy's satisfaction: yes     Relevant documents  present and verified: yes     Imaging studies available: yes      Required blood products, implants, devices, and special equipment available: yes     Immediately prior to procedure, a time out was called: yes     Patient identity confirmed:  Verbally with patient, arm band and provided demographic data Location:    Location:  Knee   Knee:  L knee Anesthesia (see MAR for exact dosages):    Anesthesia method:  Local infiltration   Local anesthetic:  Bupivacaine 0.5% w/o epi Procedure details:    Preparation: Patient was prepped and draped in usual sterile fashion     Needle gauge:  18 G   Ultrasound guidance: no     Approach:  Lateral   Aspirate amount:  30cc   Aspirate characteristics:  Blood-tinged and serous   Steroid injected: no     Specimen collected: no   Post-procedure details:    Patient tolerance of procedure:  Tolerated well, no immediate complications   (including critical care time)  Medications Ordered in ED Medications  lidocaine (LIDODERM) 5 % 1 patch (1 patch Transdermal Patch Applied 10/25/18 2000)  fentaNYL (SUBLIMAZE) injection 100 mcg (100 mcg Intravenous Given 10/25/18 1905)  fentaNYL (SUBLIMAZE) injection 150 mcg (150 mcg Intravenous Given 10/25/18 2001)  acetaminophen (TYLENOL) tablet 1,000 mg (1,000 mg Oral Given 10/25/18 2119)  oxyCODONE (Oxy IR/ROXICODONE) immediate release tablet 5 mg (5 mg Oral Given 10/25/18 2119)  bupivacaine (MARCAINE) 0.5 % injection 20 mL (20 mLs Infiltration Given by Other 10/25/18 2139)  fentaNYL (SUBLIMAZE) injection 50 mcg (50 mcg Intravenous Given 10/25/18 2350)     Initial Impression / Assessment and Plan / ED Course  I have reviewed the triage vital signs and the nursing notes.  Pertinent labs & imaging results that were available during my care of the patient were reviewed by me and considered in my medical decision making (see chart for details).  Clinical Course as of Oct 26 15  Tue Oct 25, 2018  1916 DG Knee Complete 4 Views Left [LM]  2101 DG Hand Complete Right [LM]    Clinical  Course User Index [LM] Niurka, Benecke Manes is a 85 y.o. female who presents to ED for evaluation after mechanical fall just prior to arrival. No focal neuro deficits on exam, but did hit head. CT head/c-spine were obtained and negative for acute findings. Plain film of the knee with nondisplaced transverse fracture through the mid patella as well as large knee joint effusion. Discussed with Dr. Lyla Glassing of orthopedics who recommends arthrocentesis for comfort and knee immobilizer. Arthrocentesis performed for comfort given such pain despite multiple doses of pain meds. See dictation above.   Unfortunately, patient with intractable pain despite several iv doses of pain medication as well as arthrocentesis for comfort.  Patient lives alone.  She does not feel comfortable going home in this amount of pain. She hasn't been able to ambulate in ED. Dr. Billy Fischer discussed risks of admission at length. Concerned about patient safety at home alone at her range with this new injury. Will discuss with hospitalist.  Hospitalist consulted who will admit. Labs pending.   Patient seen by and discussed with Dr. Billy Fischer who agrees with treatment plan.    Final Clinical Impressions(s) / ED Diagnoses   Final diagnoses:  Closed nondisplaced fracture of left patella, unspecified fracture morphology, initial encounter    ED Discharge Orders  Ordered    oxyCODONE-acetaminophen (PERCOCET/ROXICET) 5-325 MG tablet  Every 4 hours PRN     10/25/18 2320           Brydon Spahr, Ozella Almond, PA-C 10/26/18 0018    Gareth Morgan, MD 10/27/18 1626

## 2018-10-25 NOTE — ED Notes (Signed)
Patient has called out complain of pain and tearful. Notified York Cerise, [place] that patient is requesting additional pain medication. York Cerise, [place] stated she would need to consult with Dr. Billy Fischer about pain management.

## 2018-10-25 NOTE — Discharge Instructions (Signed)
It was my pleasure taking care of you today!   Pain medication only as needed for severe pain.   Call your orthopedist first thing in the morning to schedule a follow up appointment.   Return to ER for new or worsening symptoms, any additional concerns.

## 2018-10-25 NOTE — ED Notes (Signed)
Jaime, PA at bedside.  

## 2018-10-25 NOTE — ED Notes (Signed)
Went to bedside to speak with patient about discharging patient. She stated she could not go home and take care of herself. During her visit, she has reported she has care takers but states that no one can help her tonight. Spoke with Dr. Billy Fischer about this and she came to speak with patient. At the end of discussion, patient is going to speak with hospitalist about admission for pain control. Provided patient two warm blankets.

## 2018-10-26 ENCOUNTER — Other Ambulatory Visit: Payer: Self-pay | Admitting: *Deleted

## 2018-10-26 ENCOUNTER — Other Ambulatory Visit: Payer: Self-pay

## 2018-10-26 ENCOUNTER — Observation Stay (HOSPITAL_COMMUNITY): Payer: Medicare Other

## 2018-10-26 DIAGNOSIS — M6281 Muscle weakness (generalized): Secondary | ICD-10-CM | POA: Diagnosis not present

## 2018-10-26 DIAGNOSIS — S82002S Unspecified fracture of left patella, sequela: Secondary | ICD-10-CM | POA: Diagnosis not present

## 2018-10-26 DIAGNOSIS — Z8781 Personal history of (healed) traumatic fracture: Secondary | ICD-10-CM | POA: Diagnosis not present

## 2018-10-26 DIAGNOSIS — E538 Deficiency of other specified B group vitamins: Secondary | ICD-10-CM | POA: Diagnosis present

## 2018-10-26 DIAGNOSIS — Z794 Long term (current) use of insulin: Secondary | ICD-10-CM | POA: Diagnosis not present

## 2018-10-26 DIAGNOSIS — S0083XA Contusion of other part of head, initial encounter: Secondary | ICD-10-CM | POA: Diagnosis present

## 2018-10-26 DIAGNOSIS — Z923 Personal history of irradiation: Secondary | ICD-10-CM | POA: Diagnosis not present

## 2018-10-26 DIAGNOSIS — S82002A Unspecified fracture of left patella, initial encounter for closed fracture: Secondary | ICD-10-CM | POA: Diagnosis not present

## 2018-10-26 DIAGNOSIS — R52 Pain, unspecified: Secondary | ICD-10-CM | POA: Diagnosis not present

## 2018-10-26 DIAGNOSIS — I469 Cardiac arrest, cause unspecified: Secondary | ICD-10-CM | POA: Diagnosis not present

## 2018-10-26 DIAGNOSIS — G40909 Epilepsy, unspecified, not intractable, without status epilepticus: Secondary | ICD-10-CM | POA: Diagnosis present

## 2018-10-26 DIAGNOSIS — E871 Hypo-osmolality and hyponatremia: Secondary | ICD-10-CM | POA: Diagnosis not present

## 2018-10-26 DIAGNOSIS — E119 Type 2 diabetes mellitus without complications: Secondary | ICD-10-CM | POA: Diagnosis not present

## 2018-10-26 DIAGNOSIS — I10 Essential (primary) hypertension: Secondary | ICD-10-CM | POA: Diagnosis present

## 2018-10-26 DIAGNOSIS — F419 Anxiety disorder, unspecified: Secondary | ICD-10-CM | POA: Diagnosis present

## 2018-10-26 DIAGNOSIS — Z7401 Bed confinement status: Secondary | ICD-10-CM | POA: Diagnosis not present

## 2018-10-26 DIAGNOSIS — E041 Nontoxic single thyroid nodule: Secondary | ICD-10-CM | POA: Diagnosis present

## 2018-10-26 DIAGNOSIS — S82035A Nondisplaced transverse fracture of left patella, initial encounter for closed fracture: Secondary | ICD-10-CM | POA: Diagnosis not present

## 2018-10-26 DIAGNOSIS — R41841 Cognitive communication deficit: Secondary | ICD-10-CM | POA: Diagnosis not present

## 2018-10-26 DIAGNOSIS — J302 Other seasonal allergic rhinitis: Secondary | ICD-10-CM | POA: Diagnosis present

## 2018-10-26 DIAGNOSIS — Z853 Personal history of malignant neoplasm of breast: Secondary | ICD-10-CM | POA: Diagnosis not present

## 2018-10-26 DIAGNOSIS — M810 Age-related osteoporosis without current pathological fracture: Secondary | ICD-10-CM | POA: Diagnosis present

## 2018-10-26 DIAGNOSIS — R2681 Unsteadiness on feet: Secondary | ICD-10-CM | POA: Diagnosis not present

## 2018-10-26 DIAGNOSIS — M25562 Pain in left knee: Secondary | ICD-10-CM | POA: Diagnosis not present

## 2018-10-26 DIAGNOSIS — Z8262 Family history of osteoporosis: Secondary | ICD-10-CM | POA: Diagnosis not present

## 2018-10-26 DIAGNOSIS — Z9181 History of falling: Secondary | ICD-10-CM | POA: Diagnosis not present

## 2018-10-26 DIAGNOSIS — W010XXA Fall on same level from slipping, tripping and stumbling without subsequent striking against object, initial encounter: Secondary | ICD-10-CM | POA: Diagnosis present

## 2018-10-26 DIAGNOSIS — E860 Dehydration: Secondary | ICD-10-CM | POA: Diagnosis not present

## 2018-10-26 DIAGNOSIS — E1165 Type 2 diabetes mellitus with hyperglycemia: Secondary | ICD-10-CM | POA: Diagnosis not present

## 2018-10-26 DIAGNOSIS — S82009A Unspecified fracture of unspecified patella, initial encounter for closed fracture: Secondary | ICD-10-CM | POA: Insufficient documentation

## 2018-10-26 DIAGNOSIS — Z20828 Contact with and (suspected) exposure to other viral communicable diseases: Secondary | ICD-10-CM | POA: Diagnosis present

## 2018-10-26 DIAGNOSIS — W19XXXA Unspecified fall, initial encounter: Secondary | ICD-10-CM | POA: Diagnosis not present

## 2018-10-26 DIAGNOSIS — Y92009 Unspecified place in unspecified non-institutional (private) residence as the place of occurrence of the external cause: Secondary | ICD-10-CM | POA: Diagnosis not present

## 2018-10-26 DIAGNOSIS — M1712 Unilateral primary osteoarthritis, left knee: Secondary | ICD-10-CM | POA: Diagnosis present

## 2018-10-26 DIAGNOSIS — E876 Hypokalemia: Secondary | ICD-10-CM | POA: Diagnosis not present

## 2018-10-26 DIAGNOSIS — G250 Essential tremor: Secondary | ICD-10-CM | POA: Diagnosis present

## 2018-10-26 DIAGNOSIS — R2689 Other abnormalities of gait and mobility: Secondary | ICD-10-CM | POA: Diagnosis not present

## 2018-10-26 DIAGNOSIS — R278 Other lack of coordination: Secondary | ICD-10-CM | POA: Diagnosis not present

## 2018-10-26 DIAGNOSIS — Z85820 Personal history of malignant melanoma of skin: Secondary | ICD-10-CM | POA: Diagnosis not present

## 2018-10-26 DIAGNOSIS — M25462 Effusion, left knee: Secondary | ICD-10-CM | POA: Diagnosis not present

## 2018-10-26 DIAGNOSIS — K219 Gastro-esophageal reflux disease without esophagitis: Secondary | ICD-10-CM | POA: Diagnosis present

## 2018-10-26 DIAGNOSIS — M2550 Pain in unspecified joint: Secondary | ICD-10-CM | POA: Diagnosis not present

## 2018-10-26 LAB — BASIC METABOLIC PANEL
Anion gap: 9 (ref 5–15)
Anion gap: 10 (ref 5–15)
BUN: 12 mg/dL (ref 8–23)
BUN: 11 mg/dL (ref 8–23)
CO2: 24 mmol/L (ref 22–32)
CO2: 25 mmol/L (ref 22–32)
Calcium: 8.5 mg/dL — ABNORMAL LOW (ref 8.9–10.3)
Calcium: 8.5 mg/dL — ABNORMAL LOW (ref 8.9–10.3)
Chloride: 100 mmol/L (ref 98–111)
Chloride: 99 mmol/L (ref 98–111)
Creatinine, Ser: 0.63 mg/dL (ref 0.44–1.00)
Creatinine, Ser: 0.66 mg/dL (ref 0.44–1.00)
GFR calc Af Amer: >60 mL/min (ref >60)
GFR calc Af Amer: >60 mL/min (ref >60)
GFR calc non Af Amer: >60 mL/min (ref >60)
GFR calc non Af Amer: >60 mL/min (ref >60)
Glucose, Bld: 237 mg/dL — ABNORMAL HIGH (ref 70–99)
Glucose, Bld: 226 mg/dL — ABNORMAL HIGH (ref 70–99)
Potassium: 3.6 mmol/L (ref 3.5–5.1)
Potassium: 3.7 mmol/L (ref 3.5–5.1)
Sodium: 133 mmol/L — ABNORMAL LOW (ref 135–145)
Sodium: 134 mmol/L — ABNORMAL LOW (ref 135–145)

## 2018-10-26 LAB — CBC WITH DIFFERENTIAL/PLATELET
Abs Immature Granulocytes: 0.05 10*3/uL (ref 0.00–0.07)
Basophils Absolute: 0 10*3/uL (ref 0.0–0.1)
Basophils Relative: 0 %
Eosinophils Absolute: 0 10*3/uL (ref 0.0–0.5)
Eosinophils Relative: 0 %
HCT: 38.5 % (ref 36.0–46.0)
Hemoglobin: 12.8 g/dL (ref 12.0–15.0)
Immature Granulocytes: 0 %
Lymphocytes Relative: 9 %
Lymphs Abs: 1.1 10*3/uL (ref 0.7–4.0)
MCH: 31.8 pg (ref 26.0–34.0)
MCHC: 33.2 g/dL (ref 30.0–36.0)
MCV: 95.8 fL (ref 80.0–100.0)
Monocytes Absolute: 0.8 10*3/uL (ref 0.1–1.0)
Monocytes Relative: 7 %
Neutro Abs: 10.1 10*3/uL — ABNORMAL HIGH (ref 1.7–7.7)
Neutrophils Relative %: 84 %
Platelets: 251 10*3/uL (ref 150–400)
RBC: 4.02 MIL/uL (ref 3.87–5.11)
RDW: 13.6 % (ref 11.5–15.5)
WBC: 12.1 10*3/uL — ABNORMAL HIGH (ref 4.0–10.5)
nRBC: 0 % (ref 0.0–0.2)

## 2018-10-26 LAB — CBC
HCT: 39.4 % (ref 36.0–46.0)
Hemoglobin: 13 g/dL (ref 12.0–15.0)
MCH: 31.9 pg (ref 26.0–34.0)
MCHC: 33 g/dL (ref 30.0–36.0)
MCV: 96.8 fL (ref 80.0–100.0)
Platelets: 227 10*3/uL (ref 150–400)
RBC: 4.07 MIL/uL (ref 3.87–5.11)
RDW: 13.6 % (ref 11.5–15.5)
WBC: 11.2 10*3/uL — ABNORMAL HIGH (ref 4.0–10.5)
nRBC: 0 % (ref 0.0–0.2)

## 2018-10-26 LAB — GLUCOSE, CAPILLARY
Glucose-Capillary: 179 mg/dL — ABNORMAL HIGH (ref 70–99)
Glucose-Capillary: 179 mg/dL — ABNORMAL HIGH (ref 70–99)
Glucose-Capillary: 195 mg/dL — ABNORMAL HIGH (ref 70–99)
Glucose-Capillary: 156 mg/dL — ABNORMAL HIGH (ref 70–99)

## 2018-10-26 LAB — HEMOGLOBIN A1C
Hgb A1c MFr Bld: 8.4 % — ABNORMAL HIGH (ref 4.8–5.6)
Mean Plasma Glucose: 194.38 mg/dL

## 2018-10-26 LAB — SARS CORONAVIRUS 2 BY RT PCR (HOSPITAL ORDER, PERFORMED IN ~~LOC~~ HOSPITAL LAB): SARS Coronavirus 2: NEGATIVE

## 2018-10-26 MED ORDER — FENTANYL CITRATE (PF) 100 MCG/2ML IJ SOLN
INTRAMUSCULAR | Status: AC
  Filled 2018-10-26: qty 2

## 2018-10-26 MED ORDER — VITAMIN D 25 MCG (1000 UNIT) PO TABS
10000.0000 [IU] | ORAL_TABLET | Freq: Two times a day (BID) | ORAL | Status: DC
  Administered 2018-10-26 – 2018-10-29 (×5): 10000 [IU] via ORAL
  Filled 2018-10-26 (×4): qty 10

## 2018-10-26 MED ORDER — FENTANYL CITRATE (PF) 100 MCG/2ML IJ SOLN
12.5000 ug | INTRAMUSCULAR | Status: DC | PRN
  Administered 2018-10-26: 12.5 ug via INTRAVENOUS
  Filled 2018-10-26: qty 2

## 2018-10-26 MED ORDER — LOPERAMIDE HCL 2 MG PO CAPS
2.0000 mg | ORAL_CAPSULE | ORAL | Status: DC | PRN
  Administered 2018-10-29 (×3): 2 mg via ORAL
  Filled 2018-10-26 (×4): qty 1

## 2018-10-26 MED ORDER — ONDANSETRON HCL 4 MG PO TABS: 4.0000 mg | ORAL_TABLET | Freq: Four times a day (QID) | ORAL | Status: DC | PRN

## 2018-10-26 MED ORDER — FAMOTIDINE 20 MG PO TABS
20.0000 mg | ORAL_TABLET | Freq: Every day | ORAL | Status: DC
  Administered 2018-10-26 – 2018-10-29 (×4): 20 mg via ORAL
  Filled 2018-10-26 (×4): qty 1

## 2018-10-26 MED ORDER — FENTANYL CITRATE (PF) 100 MCG/2ML IJ SOLN: 12.5000 ug | Freq: Three times a day (TID) | INTRAMUSCULAR | Status: DC | PRN

## 2018-10-26 MED ORDER — FENTANYL CITRATE (PF) 100 MCG/2ML IJ SOLN
12.5000 ug | Freq: Once | INTRAMUSCULAR | Status: AC
  Administered 2018-10-26: 12.5 ug via INTRAVENOUS
  Filled 2018-10-26: qty 2

## 2018-10-26 MED ORDER — PHENYTOIN 50 MG PO CHEW
50.0000 mg | CHEWABLE_TABLET | Freq: Every day | ORAL | Status: DC
  Administered 2018-10-26 – 2018-10-29 (×4): 50 mg via ORAL
  Filled 2018-10-26 (×4): qty 1

## 2018-10-26 MED ORDER — INSULIN ASPART 100 UNIT/ML ~~LOC~~ SOLN
0.0000 [IU] | Freq: Three times a day (TID) | SUBCUTANEOUS | Status: DC
  Administered 2018-10-26 – 2018-10-27 (×4): 2 [IU] via SUBCUTANEOUS
  Administered 2018-10-27 (×2): 1 [IU] via SUBCUTANEOUS
  Administered 2018-10-28 – 2018-10-29 (×4): 2 [IU] via SUBCUTANEOUS

## 2018-10-26 MED ORDER — ALPRAZOLAM 0.25 MG PO TABS
0.2500 mg | ORAL_TABLET | Freq: Three times a day (TID) | ORAL | Status: DC | PRN
  Administered 2018-10-26 – 2018-10-27 (×2): 0.25 mg via ORAL
  Filled 2018-10-26 (×3): qty 1

## 2018-10-26 MED ORDER — PHENYTOIN SODIUM EXTENDED 100 MG PO CAPS
200.0000 mg | ORAL_CAPSULE | Freq: Every day | ORAL | Status: DC
  Administered 2018-10-26 – 2018-10-28 (×4): 200 mg via ORAL
  Filled 2018-10-26 (×4): qty 2

## 2018-10-26 MED ORDER — LEVOTHYROXINE SODIUM 75 MCG PO TABS
75.0000 ug | ORAL_TABLET | Freq: Every day | ORAL | Status: DC
  Administered 2018-10-26 – 2018-10-29 (×3): 75 ug via ORAL
  Filled 2018-10-26 (×3): qty 1

## 2018-10-26 MED ORDER — RISAQUAD PO CAPS
1.0000 | ORAL_CAPSULE | Freq: Three times a day (TID) | ORAL | Status: DC
  Administered 2018-10-26 – 2018-10-29 (×8): 1 via ORAL
  Filled 2018-10-26 (×8): qty 1

## 2018-10-26 MED ORDER — VERAPAMIL HCL ER 240 MG PO TBCR
240.0000 mg | EXTENDED_RELEASE_TABLET | Freq: Every day | ORAL | Status: DC
  Administered 2018-10-26 – 2018-10-29 (×4): 240 mg via ORAL
  Filled 2018-10-26 (×4): qty 1

## 2018-10-26 MED ORDER — ENOXAPARIN SODIUM 40 MG/0.4ML ~~LOC~~ SOLN
40.0000 mg | Freq: Every day | SUBCUTANEOUS | Status: DC
  Administered 2018-10-26 – 2018-10-29 (×4): 40 mg via SUBCUTANEOUS
  Filled 2018-10-26 (×4): qty 0.4

## 2018-10-26 MED ORDER — INSULIN ASPART 100 UNIT/ML ~~LOC~~ SOLN: 0.0000 [IU] | Freq: Every day | SUBCUTANEOUS | Status: DC

## 2018-10-26 MED ORDER — POLYVINYL ALCOHOL 1.4 % OP SOLN
2.0000 [drp] | Freq: Two times a day (BID) | OPHTHALMIC | Status: DC
  Administered 2018-10-26 – 2018-10-29 (×7): 2 [drp] via OPHTHALMIC
  Filled 2018-10-26: qty 15

## 2018-10-26 MED ORDER — METHOCARBAMOL 500 MG PO TABS
500.0000 mg | ORAL_TABLET | Freq: Four times a day (QID) | ORAL | Status: DC | PRN
  Administered 2018-10-26 – 2018-10-29 (×6): 500 mg via ORAL
  Filled 2018-10-26 (×6): qty 1

## 2018-10-26 MED ORDER — BIOTIN 1000 MCG PO TABS: 1000.0000 ug | ORAL_TABLET | Freq: Every day | ORAL | Status: DC

## 2018-10-26 MED ORDER — LIDOCAINE HCL 1 % IJ SOLN
10.0000 mL | Freq: Once | INTRAMUSCULAR | Status: DC
  Filled 2018-10-26: qty 10

## 2018-10-26 MED ORDER — OXYCODONE-ACETAMINOPHEN 5-325 MG PO TABS
1.0000 | ORAL_TABLET | ORAL | Status: DC | PRN
  Administered 2018-10-26 (×3): 1 via ORAL
  Filled 2018-10-26 (×3): qty 1

## 2018-10-26 MED ORDER — INSULIN GLARGINE 100 UNIT/ML ~~LOC~~ SOLN
14.0000 [IU] | Freq: Every day | SUBCUTANEOUS | Status: DC
  Administered 2018-10-26 – 2018-10-28 (×4): 14 [IU] via SUBCUTANEOUS
  Filled 2018-10-26 (×5): qty 0.14

## 2018-10-26 MED ORDER — LIDOCAINE 1% INJECTION FOR CIRCUMCISION
3.0000 mL | INJECTION | Freq: Once | INTRAVENOUS | Status: DC
  Filled 2018-10-26: qty 3

## 2018-10-26 MED ORDER — TRANDOLAPRIL-VERAPAMIL HCL ER 2-240 MG PO TBCR: 1.0000 | EXTENDED_RELEASE_TABLET | Freq: Every day | ORAL | Status: DC

## 2018-10-26 MED ORDER — FENTANYL CITRATE (PF) 100 MCG/2ML IJ SOLN: 12.5000 ug | Freq: Once | INTRAMUSCULAR | Status: DC

## 2018-10-26 MED ORDER — FENTANYL CITRATE (PF) 100 MCG/2ML IJ SOLN
25.0000 ug | INTRAMUSCULAR | Status: DC | PRN
  Administered 2018-10-26 (×2): 25 ug via INTRAVENOUS
  Filled 2018-10-26: qty 2

## 2018-10-26 MED ORDER — TRANDOLAPRIL 2 MG PO TABS
2.0000 mg | ORAL_TABLET | Freq: Every day | ORAL | Status: DC
  Administered 2018-10-26 – 2018-10-29 (×4): 2 mg via ORAL
  Filled 2018-10-26 (×4): qty 1

## 2018-10-26 MED ORDER — PHENYTOIN SODIUM EXTENDED 100 MG PO CAPS
100.0000 mg | ORAL_CAPSULE | Freq: Every day | ORAL | Status: DC
  Administered 2018-10-26 – 2018-10-29 (×4): 100 mg via ORAL
  Filled 2018-10-26 (×4): qty 1

## 2018-10-26 MED ORDER — NALOXONE HCL 0.4 MG/ML IJ SOLN
0.4000 mg | INTRAMUSCULAR | Status: DC | PRN
  Administered 2018-10-27: 0.4 mg via INTRAVENOUS

## 2018-10-26 MED ORDER — ONDANSETRON HCL 4 MG/2ML IJ SOLN
4.0000 mg | Freq: Four times a day (QID) | INTRAMUSCULAR | Status: DC | PRN
  Administered 2018-10-26: 4 mg via INTRAVENOUS
  Filled 2018-10-26: qty 2

## 2018-10-26 MED ORDER — POLYETHYLENE GLYCOL 3350 17 G PO PACK
17.0000 g | PACK | Freq: Every day | ORAL | Status: AC
  Filled 2018-10-26 (×3): qty 1

## 2018-10-26 MED ORDER — SACCHAROMYCES BOULARDII 250 MG PO CAPS
250.0000 mg | ORAL_CAPSULE | Freq: Two times a day (BID) | ORAL | Status: DC
  Administered 2018-10-26 – 2018-10-29 (×7): 250 mg via ORAL
  Filled 2018-10-26 (×7): qty 1

## 2018-10-26 MED ORDER — PRIMIDONE 50 MG PO TABS
100.0000 mg | ORAL_TABLET | Freq: Two times a day (BID) | ORAL | Status: DC
  Administered 2018-10-26 – 2018-10-29 (×8): 100 mg via ORAL
  Filled 2018-10-26 (×9): qty 2

## 2018-10-26 MED ORDER — NALOXONE HCL 0.4 MG/ML IJ SOLN
INTRAMUSCULAR | Status: AC
  Filled 2018-10-26: qty 1

## 2018-10-26 MED ORDER — MAGNESIUM CITRATE PO SOLN: 1.0000 | Freq: Once | ORAL | Status: DC | PRN

## 2018-10-26 MED ORDER — ESCITALOPRAM OXALATE 10 MG PO TABS
10.0000 mg | ORAL_TABLET | Freq: Every day | ORAL | Status: DC
  Administered 2018-10-26 – 2018-10-29 (×4): 10 mg via ORAL
  Filled 2018-10-26 (×4): qty 1

## 2018-10-26 NOTE — Procedures (Signed)
After verbal consent was obtained, the right knee was identified and prepared with Betadine. The knee was then injected from a superolateral approach with a mixture of 3cc of Lidocaine. Adequate time was allowed for the medication to take effect. An 18 gauge needle was then introduced from the superolateral aspect and then 115 cc of bloody fluid was aspirated. The patient tolerated the procedure well, without complications. The injection site was dressed with an adhesive bandage. The patient was advised to alert the nurse with any adverse reactions. The patient was advised to avoid aggressive physical activity for 48 hours and advised to ice the knee intermittently over the next 24 hours.  Theresa Duty, PA-C Orthopedic Surgery [facility] [place]

## 2018-10-26 NOTE — Progress Notes (Signed)
Orthopedic Tech Progress Note Patient Details:  [name] Jun 26, 1929 [id number] Pt already has a knee immobilizer on. Ortho Devices Type of Ortho Device: Knee Immobilizer Ortho Device/Splint Location: Lt knee Ortho Device/Splint Interventions: Application   Post Interventions Patient Tolerated: Well Instructions Provided: Adjustment of device, Care of device   Ladell Pier [facility] 10/26/2018, 10:20 AM

## 2018-10-26 NOTE — Progress Notes (Signed)
PT Cancellation Note  Patient Details Name: [name] MRN: [id number] DOB: 19-Jun-1929   Cancelled Treatment:     PT order received. Pt present with a nondisplaced patella fx. Pt is awaiting an ortho consult. Per RN, pt is c/o significant pain. Will hold evaluation until ortho clears pt for mobility and clarifies WB status.   Lelon Mast 10/26/2018, 10:32 AM

## 2018-10-26 NOTE — ED Notes (Signed)
Will Covid-19 swab be completed before patient is moved?

## 2018-10-26 NOTE — Progress Notes (Signed)
PHARMACIST - PHYSICIAN ORDER COMMUNICATION  CONCERNING: P&T Medication Policy on Herbal Medications  DESCRIPTION:  This patient's order for:  biotin  has been noted.  This product(s) is classified as an "herbal" or natural product. Due to a lack of definitive safety studies or FDA approval, nonstandard manufacturing practices, plus the potential risk of unknown drug-drug interactions while on inpatient medications, the Pharmacy and Therapeutics Committee does not permit the use of "herbal" or natural products of this type within [facility].   ACTION TAKEN: The pharmacy department is unable to verify this order at this time and your patient has been informed of this safety policy. Please reevaluate patient's clinical condition at discharge and address if the herbal or natural product(s) should be resumed at that time.   Thanks Dorrene German 10/26/2018 2:06 AM

## 2018-10-26 NOTE — Progress Notes (Signed)
Patient is admitted after midnight due to left knee pain after fall, she is found to have patellar fracture and large knee effusion, pain control is not adequate, increase prn iv fentanyl , add on oral percocet, repeat knee x ray showed increased effusion, ortho formally consulted.

## 2018-10-26 NOTE — Plan of Care (Signed)
Plan of care reveiwed and discussed with the patient.

## 2018-10-26 NOTE — ED Notes (Signed)
Gave report to [facility] for room 1327.

## 2018-10-26 NOTE — Consult Note (Addendum)
Patient ID: [name] MRN: [id number] DOB/AGE: 12-19-1929 85 y.o.  Admit date: 10/25/2018  Chief Complaint: Left knee pain  Subjective: [name] is an 85 year old female who presented to [facility] Emergency Department for left knee pain following a fall at home on 06/23. Patient lives at home by herself, and states she tripped and fell directly onto both knees. She is a patient of Dr. Theda Sers, and reports she has osteoarthritis in the left knee and experiences occasional buckling/instability in the joint. Workup in the ED showed a closed nondisplaced left patellar fracture on x-ray. Left knee was aspirated and knee immobilizer was placed per Dr. Lyla Glassing on 06/23. Orthopedics was consulted today due to recurrent effusion of the left knee. During exam today patient is endorsing increased pain due to muscle spasms causing her knee to "stiffen up."   Allergies: Allergies  Allergen Reactions   Bystolic [Nebivolol Hcl] Other (See Comments)    "extreme weakness, heaviness in legs & arms, swelling in legs/arms/face, swollen abdomen, pain in bladder, feet pain, soreness in chest"   Cholestyramine     "itching rash on stomach, bloated, nausea, vomiting, sleeplessness, extreme pain in arms"   Hydrazine Yellow [Tartrazine] Other (See Comments)    "does not reduce high blood pressure, pain in arm, high pressure, felt like I was on verge of heart attack, really weak"   Morphine Other (See Comments)    Feels morbid, weak, still in pain   Niacin Palpitations    Fast heart beat   Niaspan [Niacin Er] Palpitations and Other (See Comments)    "fast heart beat, high blood pressure"   Norvasc [Amlodipine Besylate] Other (See Comments)    "extreme fluid retention/pain)   Optivar [Azelastine Hcl] Photosensitivity   Repaglinide Hives   Sular [Nisoldipine Er] Other (See Comments)    "severe headaches, swelling eyes, hands, feet, shortness of breath, weak, flushed face, brain boiling, fluid  retention, high blood sugar, nervous, heart fast beating"   Tegretol [Carbamazepine] Other (See Comments)    Blood poisoning    Telmisartan Other (See Comments)    "headache, difficulty urinating, high blood sugar, fluid retention"   Cefdinir Swelling    Vaginal irritation, breathing,    Clonidine Other (See Comments)    Dry mouth, fluid retention   Clonidine Hydrochloride Other (See Comments)    Dry mouth, fluid retention   Codeine Nausea And Vomiting   Ezetimibe Other (See Comments)    Made weak   Naproxen Other (See Comments)    Shrinks bladder   Ziac [Bisoprolol-Hydrochlorothiazide] Other (See Comments)    "stopped urination"   Elavil [Amitriptyline] Other (See Comments)    Gave Pt nightmares   Hydralazine    Iodine    Keppra [Levetiracetam] Other (See Comments)    Shaking   Lamictal [Lamotrigine]     itching   Lyrica [Pregabalin] Swelling   Pseudoephedrine    Topamax [Topiramate]     Dry eyes   Ace Inhibitors Other (See Comments)    unknown   Actonel [Risedronate Sodium] Other (See Comments)    unknown   Amlodipine Besylate Other (See Comments)    unknown   Aspirin Other (See Comments)    unknown   Atacand [Candesartan] Other (See Comments)    unknown   Bextra [Valdecoxib] Other (See Comments)    unknown   Bisoprolol-Hydrochlorothiazide Other (See Comments)    unknown   Candesartan Cilexetil Other (See Comments)    unknown   Cefadroxil Other (  See Comments)    unknown   Celecoxib Rash   Hydrocodone Other (See Comments)    unknown   Hydrocodone-Acetaminophen Other (See Comments)    unknown   Iodinated Diagnostic Agents Rash    "All over" "All over"   Meloxicam Other (See Comments)    unknown   Methylprednisolone Sodium Succinate Other (See Comments)    unknown   Nabumetone Other (See Comments)    unknown   Penicillins Other (See Comments)    unknown   Pseudoephedrine-Guaifenesin Other (See Comments)    unknown    Risedronate Sodium Other (See Comments)    unknown   Rofecoxib Other (See Comments)    unknown   Ru-Tuss [Chlorphen-Pse-Atrop-Hyos-Scop] Other (See Comments)    unknown   Sulfonamide Derivatives Other (See Comments)    unknown   Sulphur [Sulfur] Other (See Comments)    unknown   Telithromycin Other (See Comments)    unknown   Terfenadine Other (See Comments)    unknown   Trandolapril-Verapamil Hcl Er Other (See Comments)    Headache, difficulty urinating, high blood sugar, fluid retention  Pt is taking Tarka (trandolapril-verapamil) currently, but requests the medication stay in her allergy list   Trandolapril-Verapamil Hcl Er Other (See Comments)    Headache, difficulty urinating, high blood sugar, fluid retention  Pt is taking Tarka (trandolapril-verapamil) currently, but requests the medication stay in her allergy list   Valium [Diazepam] Other (See Comments)    Makes her mean and hyper     Medications: Medications Prior to Admission  Medication Sig Dispense Refill Last Dose   Biotin 1000 MCG tablet Take 1,000 mcg by mouth daily.    10/25/2018 at Unknown time   Cholecalciferol 5000 units capsule Take 10,000 Units by mouth 2 (two) times a day.    10/25/2018 at Unknown time   cyanocobalamin (,VITAMIN B-12,) 1000 MCG/ML injection INJECT 1 ML INTRAMUSCULARLY EVERY 21 DAYS (Patient taking differently: Inject 1,000 mcg into the muscle every 21 ( twenty-one) days. ) 3 mL 2 Past Month at Unknown time   DiphenhydrAMINE HCl (BENADRYL ALLERGY PO) Take 1 tablet by mouth as needed.   10/24/2018 at Unknown time   empagliflozin (JARDIANCE) 10 MG TABS tablet Take 10 mg by mouth daily.   10/25/2018 at Unknown time   escitalopram (LEXAPRO) 10 MG tablet Take 10 mg by mouth daily.   10/25/2018 at Unknown time   famotidine (PEPCID) 20 MG tablet Take 20 mg by mouth daily.    Past Week at Unknown time   insulin glargine (LANTUS) 100 UNIT/ML injection Inject 14 Units into the skin at  bedtime.    10/24/2018 at Unknown time   levothyroxine (SYNTHROID) 75 MCG tablet Take 75 mcg by mouth daily before breakfast.   10/25/2018 at Unknown time   lidocaine (LIDODERM) 5 % Place 1 patch onto the skin daily. Remove & Discard patch within 12 hours or as directed by MD (Patient taking differently: Place 1-2 patches onto the skin daily as needed (pain). Remove & Discard patch within 12 hours or as directed by MD) 90 patch 1 10/25/2018 at Unknown time   phenytoin (DILANTIN) 100 MG ER capsule 2 capsules at bedtime and 1 in the morning (Patient taking differently: Take 1-2 mg by mouth 2 (two) times daily. Take 1 capsule (100 mg) in the morning and Take 2 capsules (200 mg) at bedtime) 270 capsule 2 10/25/2018 at 1000   PHENYTOIN INFATABS 50 MG tablet CHEW 1 TABLET DAILY (Patient taking differently: Chew 50  mg by mouth daily. Take along with 100 mg tablet in the morning) 90 tablet 3 10/25/2018 at 1000   Polyethyl Glycol-Propyl Glycol 0.4-0.3 % SOLN Place 2 drops into both eyes 2 (two) times a day.    10/25/2018 at Unknown time   primidone (MYSOLINE) 50 MG tablet Take 2 tablets by mouth twice daily 360 tablet 1 10/25/2018 at Unknown time   Probiotic Product (ALIGN) 4 MG CAPS Take 1 capsule by mouth 3 (three) times daily after meals.    10/25/2018 at Unknown time   saccharomyces boulardii (FLORASTOR) 250 MG capsule Take 250 mg by mouth 2 (two) times daily.   10/25/2018 at Unknown time   traMADol (ULTRAM) 50 MG tablet Take 50 mg by mouth every 6 (six) hours as needed.    10/25/2018 at Unknown time   trandolapril-verapamil (TARKA) 2-240 MG tablet Take 1 tablet by mouth daily.   10/24/2018 at Unknown time   ALPRAZolam (XANAX) 0.25 MG tablet Take 1 tablet (0.25 mg total) by mouth 3 (three) times daily as needed for anxiety. 180 tablet 1 unknown   amoxicillin-clavulanate (AUGMENTIN) 875-125 MG tablet Take 1 tablet by mouth 2 (two) times daily.  0 Not Taking at Unknown time   Blood Glucose Monitoring Suppl  (PRODIGY VOICE BLOOD GLUCOSE) w/Device KIT Use to check blood sugar 1 time per day. 1 each 2    furosemide (LASIX) 20 MG tablet Take 1 tablet (20 mg total) by mouth daily. Do not take until I call you about labs. (Patient not taking: Reported on 10/25/2018) 30 tablet 0 Not Taking at Unknown time   glucose blood test strip Use to check blood sugar 1 time per day. 100 each 3    insulin lispro (HUMALOG KWIKPEN) 100 UNIT/ML KiwkPen Inject 0.07 mLs (7 Units total) into the skin 3 (three) times daily with meals. And pen needles 1/day (Patient not taking: Reported on 09/06/2018) 15 pen 3 Completed Course at Unknown time   Insulin Pen Needle 32G X 4 MM MISC Used to inject insulin 3x daily 270 each 2    levothyroxine (SYNTHROID, LEVOTHROID) 50 MCG tablet TAKE 1 TABLET DAILY BEFORE BREAKFAST (Patient not taking: Reported on 10/25/2018) 90 tablet 1 Not Taking at Unknown time   loperamide (IMODIUM) 2 MG capsule Take 2 mg by mouth as needed for diarrhea or loose stools.   unknown    Past Medical History: Past Medical History:  Diagnosis Date   Anemia    Anxiety    Breast cancer ([place]) 2014   right breast   Cancer of right breast ([place]) 12/26/12   right breast 12:00 o'clock, DCIS   Chronic cough    Chronic facial pain    Chronic foot pain    Complication of anesthesia    Sore jaw; could not chew or move mouth   Convulsions/seizures ([place]) 10/16/2014   Diabetes mellitus    type 2 niddm x 20 years   Dyslipidemia    Ejection fraction    GERD (gastroesophageal reflux disease)    Hammer toe    bilateral   History of colonic polyps    HTN (hypertension)    Hx of radiation therapy 03/07/13- 03/29/13   right breast 4250 cGy 17 sessions   Hyperlipidemia    Melanoma ([place])    Metatarsal bone fracture 2014   Multiple drug allergies    Obesity    Osteoarthritis    Palpitations    Personal history of radiation therapy 2014   Seasonal allergies  Skin cancer    Tremor,  essential 08/18/2016   Vitamin B12 deficiency      Past Surgical History: Past Surgical History:  Procedure Laterality Date   ABDOMINAL HYSTERECTOMY     BRAIN SURGERY     BREAST BIOPSY Right 01/24/2013   Procedure: RE-EXCICION OF BREAST CANCER, ANTERIOR MARGINS;  Surgeon: Edward Jolly, MD;  Location: [facility];  Service: General;  Laterality: Right;   BREAST LUMPECTOMY Right 2014   BREAST LUMPECTOMY WITH NEEDLE LOCALIZATION Right 01/17/2013   Procedure: BREAST LUMPECTOMY WITH NEEDLE LOCALIZATION;  Surgeon: Edward Jolly, MD;  Location: [facility];  Service: General;  Laterality: Right;   BUNIONECTOMY     CATARACT EXTRACTION W/ INTRAOCULAR LENS IMPLANT Right    CHOLECYSTECTOMY     COLONOSCOPY     CRANIOTOMY Right 10/18/2013   Procedure: CRANIOTOMY TUMOR EXCISION;  Surgeon: Floyce Stakes, MD;  Location: [facility];  Service: Neurosurgery;  Laterality: Right;   EYE SURGERY     HERNIA REPAIR     KNEE ARTHROSCOPY Bilateral    POLYPECTOMY     small adenomatous   ULNAR TUNNEL RELEASE       Family History: Family History  Problem Relation Age of Onset   Heart disease Mother    Osteoporosis Mother    Diabetes Father    Pancreatic cancer Father    Colon cancer Other    Bone cancer Sister    Prostate cancer Brother    Colon cancer Brother    Rectal cancer Sister    Thyroid disease Sister        benign goiter resected    Social History: Social History   Tobacco Use   Smoking status: Never Smoker   Smokeless tobacco: Never Used  Substance Use Topics   Alcohol use: No     Review of Systems Pertinent items are noted in HPI.  Physical Exam:  Vitals:  General: alert, cooperative and mild distress due to pain  Musculoskeletal:  Left Knee Exam: Large effusion with no ecchymosis or erythema. Warm to touch. No rashes or lesions. Diffuse tenderness to palpation, with focal tenderness at the patella. Motor function intact, able to wiggle  toes. Distal pulses 2+. Calves soft and nontender. Dorsiflexion and plantar flexion intact.    LABS: Results for orders placed or performed during the hospital encounter of 10/25/18  SARS Coronavirus 2 (CEPHEID - Performed in [place] hospital lab), [facility] Order   Specimen: Nasopharyngeal Swab  Result Value Ref Range   SARS Coronavirus 2 NEGATIVE NEGATIVE  CBC with Differential  Result Value Ref Range   WBC 12.1 (H) 4.0 - 10.5 K/uL   RBC 4.02 3.87 - 5.11 MIL/uL   Hemoglobin 12.8 12.0 - 15.0 g/dL   HCT 38.5 36.0 - 46.0 %   MCV 95.8 80.0 - 100.0 fL   MCH 31.8 26.0 - 34.0 pg   MCHC 33.2 30.0 - 36.0 g/dL   RDW 13.6 11.5 - 15.5 %   Platelets 251 150 - 400 K/uL   nRBC 0.0 0.0 - 0.2 %   Neutrophils Relative % 84 %   Neutro Abs 10.1 (H) 1.7 - 7.7 K/uL   Lymphocytes Relative 9 %   Lymphs Abs 1.1 0.7 - 4.0 K/uL   Monocytes Relative 7 %   Monocytes Absolute 0.8 0.1 - 1.0 K/uL   Eosinophils Relative 0 %   Eosinophils Absolute 0.0 0.0 - 0.5 K/uL   Basophils Relative 0 %   Basophils Absolute 0.0 0.0 -  0.1 K/uL   Immature Granulocytes 0 %   Abs Immature Granulocytes 0.05 0.00 - 0.07 K/uL  Basic metabolic panel  Result Value Ref Range   Sodium 133 (L) 135 - 145 mmol/L   Potassium 3.6 3.5 - 5.1 mmol/L   Chloride 100 98 - 111 mmol/L   CO2 24 22 - 32 mmol/L   Glucose, Bld 237 (H) 70 - 99 mg/dL   BUN 12 8 - 23 mg/dL   Creatinine, Ser 0.63 0.44 - 1.00 mg/dL   Calcium 8.5 (L) 8.9 - 10.3 mg/dL   GFR calc non Af Amer >60 >60 mL/min   GFR calc Af Amer >60 >60 mL/min   Anion gap 9 5 - 15  Hemoglobin A1c  Result Value Ref Range   Hgb A1c MFr Bld 8.4 (H) 4.8 - 5.6 %   Mean Plasma Glucose 194.38 mg/dL  Basic metabolic panel  Result Value Ref Range   Sodium 134 (L) 135 - 145 mmol/L   Potassium 3.7 3.5 - 5.1 mmol/L   Chloride 99 98 - 111 mmol/L   CO2 25 22 - 32 mmol/L   Glucose, Bld 226 (H) 70 - 99 mg/dL   BUN 11 8 - 23 mg/dL   Creatinine, Ser 0.66 0.44 - 1.00 mg/dL   Calcium 8.5 (L) 8.9  - 10.3 mg/dL   GFR calc non Af Amer >60 >60 mL/min   GFR calc Af Amer >60 >60 mL/min   Anion gap 10 5 - 15  CBC  Result Value Ref Range   WBC 11.2 (H) 4.0 - 10.5 K/uL   RBC 4.07 3.87 - 5.11 MIL/uL   Hemoglobin 13.0 12.0 - 15.0 g/dL   HCT 39.4 36.0 - 46.0 %   MCV 96.8 80.0 - 100.0 fL   MCH 31.9 26.0 - 34.0 pg   MCHC 33.0 30.0 - 36.0 g/dL   RDW 13.6 11.5 - 15.5 %   Platelets 227 150 - 400 K/uL   nRBC 0.0 0.0 - 0.2 %  Glucose, capillary  Result Value Ref Range   Glucose-Capillary 179 (H) 70 - 99 mg/dL  Glucose, capillary  Result Value Ref Range   Glucose-Capillary 179 (H) 70 - 99 mg/dL   Recent Labs    10/26/18 0008 10/26/18 0213  HGB 12.8 13.0   Recent Labs    10/26/18 0008 10/26/18 0213  WBC 12.1* 11.2*  RBC 4.02 4.07  HCT 38.5 39.4  PLT 251 227   Recent Labs    10/26/18 0008 10/26/18 0213  NA 133* 134*  K 3.6 3.7  CL 100 99  CO2 24 25  BUN 12 11  CREATININE 0.63 0.66  GLUCOSE 237* 226*  CALCIUM 8.5* 8.5*    Assessment/Plan: Nondisplaced left patellar fracture with hemarthrosis  [name] has a closed nondisplaced left patellar fracture which will not require operative management. She does have a large effusion on exam, aspirated left knee of 115 cc's of bloody fluid. Patient tolerated procedure without difficulty. Continue knee immobilizer with no range of motion of the left leg. Weight-bearing as tolerated to the LLE. Apply frequent ice to left knee to help with swelling. Methocarbamol 500 mg q6 added to PO pain medications to alleviate spasms. She will follow-up in the office with [place] in 2 weeks for repeat x-rays to ensure the fracture has not displaced.    Theresa Duty, PA-C Orthopedic Surgery [facility] [place]

## 2018-10-26 NOTE — Patient Outreach (Signed)
[facility]) Care Management  10/26/2018  Melony Tenpas [place] [birth year] [id number]   RN Health CoachHosiptalization  Referral Date:09/06/2017 Referral Source:Transfer from [place] Nurse Reason for Referral:Continued Disease Management Education Insurance:Medicare   Outreach Attempt:  Received notification patient hospitalized at [facility] after fall at home and broken patella bone.  Awaiting Physical Therapy consult and recommendations.  Message sent to [facility] Liaison to assess discharging needs.  Plan:  RN Health Coach will await recommendations from [place] for patient's discharge needs.  [place] [phone] [email or web address]

## 2018-10-27 ENCOUNTER — Ambulatory Visit: Payer: Medicare Other | Admitting: Podiatry

## 2018-10-27 DIAGNOSIS — M25462 Effusion, left knee: Secondary | ICD-10-CM

## 2018-10-27 DIAGNOSIS — Z794 Long term (current) use of insulin: Secondary | ICD-10-CM

## 2018-10-27 DIAGNOSIS — S82002A Unspecified fracture of left patella, initial encounter for closed fracture: Secondary | ICD-10-CM

## 2018-10-27 DIAGNOSIS — E119 Type 2 diabetes mellitus without complications: Secondary | ICD-10-CM

## 2018-10-27 DIAGNOSIS — E871 Hypo-osmolality and hyponatremia: Secondary | ICD-10-CM

## 2018-10-27 LAB — COMPREHENSIVE METABOLIC PANEL
ALT: 16 U/L (ref 0–44)
AST: 12 U/L — ABNORMAL LOW (ref 15–41)
Albumin: 3.4 g/dL — ABNORMAL LOW (ref 3.5–5.0)
Alkaline Phosphatase: 67 U/L (ref 38–126)
Anion gap: 10 (ref 5–15)
BUN: 13 mg/dL (ref 8–23)
CO2: 22 mmol/L (ref 22–32)
Calcium: 8.6 mg/dL — ABNORMAL LOW (ref 8.9–10.3)
Chloride: 97 mmol/L — ABNORMAL LOW (ref 98–111)
Creatinine, Ser: 0.65 mg/dL (ref 0.44–1.00)
GFR calc Af Amer: >60 mL/min (ref >60)
GFR calc non Af Amer: >60 mL/min (ref >60)
Glucose, Bld: 193 mg/dL — ABNORMAL HIGH (ref 70–99)
Potassium: 3.6 mmol/L (ref 3.5–5.1)
Sodium: 129 mmol/L — ABNORMAL LOW (ref 135–145)
Total Bilirubin: 0.5 mg/dL (ref 0.3–1.2)
Total Protein: 5.8 g/dL — ABNORMAL LOW (ref 6.5–8.1)

## 2018-10-27 LAB — CBC WITH DIFFERENTIAL/PLATELET
Abs Immature Granulocytes: 0.06 10*3/uL (ref 0.00–0.07)
Basophils Absolute: 0 10*3/uL (ref 0.0–0.1)
Basophils Relative: 0 %
Eosinophils Absolute: 0 10*3/uL (ref 0.0–0.5)
Eosinophils Relative: 0 %
HCT: 37.4 % (ref 36.0–46.0)
Hemoglobin: 12.2 g/dL (ref 12.0–15.0)
Immature Granulocytes: 1 %
Lymphocytes Relative: 11 %
Lymphs Abs: 1.2 10*3/uL (ref 0.7–4.0)
MCH: 31.9 pg (ref 26.0–34.0)
MCHC: 32.6 g/dL (ref 30.0–36.0)
MCV: 97.9 fL (ref 80.0–100.0)
Monocytes Absolute: 1.1 10*3/uL — ABNORMAL HIGH (ref 0.1–1.0)
Monocytes Relative: 10 %
Neutro Abs: 8.7 10*3/uL — ABNORMAL HIGH (ref 1.7–7.7)
Neutrophils Relative %: 78 %
Platelets: 248 10*3/uL (ref 150–400)
RBC: 3.82 MIL/uL — ABNORMAL LOW (ref 3.87–5.11)
RDW: 13.6 % (ref 11.5–15.5)
WBC: 11.1 10*3/uL — ABNORMAL HIGH (ref 4.0–10.5)
nRBC: 0 % (ref 0.0–0.2)

## 2018-10-27 LAB — GLUCOSE, CAPILLARY
Glucose-Capillary: 152 mg/dL — ABNORMAL HIGH (ref 70–99)
Glucose-Capillary: 139 mg/dL — ABNORMAL HIGH (ref 70–99)
Glucose-Capillary: 122 mg/dL — ABNORMAL HIGH (ref 70–99)
Glucose-Capillary: 120 mg/dL — ABNORMAL HIGH (ref 70–99)

## 2018-10-27 LAB — MAGNESIUM: Magnesium: 1.8 mg/dL (ref 1.7–2.4)

## 2018-10-27 MED ORDER — ACETAMINOPHEN 325 MG PO TABS
650.0000 mg | ORAL_TABLET | Freq: Four times a day (QID) | ORAL | Status: DC | PRN
  Administered 2018-10-27 – 2018-10-29 (×5): 650 mg via ORAL
  Filled 2018-10-27 (×5): qty 2

## 2018-10-27 MED ORDER — SODIUM CHLORIDE 0.9 % IV SOLN
INTRAVENOUS | Status: AC
  Administered 2018-10-27: 10:00:00 via INTRAVENOUS

## 2018-10-27 NOTE — Progress Notes (Signed)
Patient confused and awake.

## 2018-10-27 NOTE — Progress Notes (Signed)
Patient alert and confused this morning.

## 2018-10-27 NOTE — NC FL2 (Addendum)
[place] LEVEL OF CARE SCREENING TOOL     IDENTIFICATION  Patient Name: [name] Birthdate: Jul 23, 1929 Sex: female Admission Date (Current Location): 10/25/2018  [facility] and [place] Number:  Herbalist and Address:  [facility],  [place] [address] [place]      Provider Number: [id number]  Attending Physician Name and Address:  Florencia Reasons, MD  Relative Name and Phone Number:       Current Level of Care: SNF Recommended Level of Care: [place] Prior Approval Number:    Date Approved/Denied:   PASRR Number: [id number]  Discharge Plan: SNF    Current Diagnoses: Patient Active Problem List   Diagnosis Date Noted  . Left patella fracture 10/26/2018  . Acquired hypothyroidism 08/25/2017  . 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 Hammer toe 08/25/2017  . History of breast cancer 08/25/2017  . Nontoxic thyroid nodule 08/25/2017  . Osteopetrosis 08/25/2017  . Dysuria 01/19/2017  . Vitamin D deficiency 11/18/2016  . Tremor, essential 08/18/2016  . Convulsions/seizures ([place]) 10/16/2014  . Brain tumor (benign) ([place]) 10/18/2013  . Subdural hemorrhage ([place]) 10/09/2013  . Anxiety 10/09/2013  . Compression fracture of T12 vertebra ([place]) 10/09/2013  . Ejection fraction   . Multiple thyroid nodules 09/05/2013  . Syncope 08/27/2013  . Carotid artery disease ([place]) 08/27/2013  . Abnormal thyroid ultrasound 08/27/2013  . Cancer of right breast ([place])   . Cancer of central portion of female breast ([place]) 12/30/2012  . Osteoporosis 03/06/2010  . ASTHMA 03/05/2009  . IRRITABLE BOWEL SYNDROME  diarrhea type 03/21/2008  . ANEMIA-NOS 12/16/2006  . GERD 12/16/2006  . Diabetes ([place]) 10/29/2006  . Hyperlipidemia 10/29/2006  . Essential hypertension 10/29/2006  . Allergic rhinitis, cause unspecified 10/29/2006  . OSTEOARTHRITIS 10/29/2006    Orientation RESPIRATION BLADDER Height & Weight     Self, Time,  Situation, Place  Normal Continent Weight: 61.7 kg Height:  5' 7.5" (171.5 cm)  BEHAVIORAL SYMPTOMS/MOOD NEUROLOGICAL BOWEL NUTRITION STATUS      Continent Diet(regular)  AMBULATORY STATUS COMMUNICATION OF NEEDS Skin   Extensive Assist Verbally Normal                       Personal Care Assistance Level of Assistance  Bathing, Feeding, Dressing Bathing Assistance: Limited assistance Feeding assistance: Limited assistance Dressing Assistance: Limited assistance     Functional Limitations Info             SPECIAL CARE FACTORS FREQUENCY                       Contractures Contractures Info: Not present    Additional Factors Info  Code Status Code Status Info: full             Current Medications (10/27/2018):  This is the current hospital active medication list Current Facility-Administered Medications  Medication Dose Route Frequency Provider Last Rate Last Dose  . 0.9 %  sodium chloride infusion   Intravenous Continuous Florencia Reasons, MD 75 mL/hr at 10/27/18 1018    . acetaminophen (TYLENOL) tablet 650 mg  650 mg Oral Q6H PRN Hollace Hayward K, NP   650 mg at 10/27/18 0941  . acidophilus (RISAQUAD) capsule 1 capsule  1 capsule Oral TID PC Hugelmeyer, Alexis, DO   1 capsule at 10/27/18 0843  . ALPRAZolam (XANAX) tablet 0.25 mg  0.25 mg Oral TID PRN Hugelmeyer, Alexis, DO   0.25  mg at 10/26/18 0819  . cholecalciferol (VITAMIN D3) tablet 10,000 Units  10,000 Units Oral BID Hugelmeyer, Alexis, DO   10,000 Units at 10/27/18 0844  . enoxaparin (LOVENOX) injection 40 mg  40 mg Subcutaneous Daily Hugelmeyer, Alexis, DO   40 mg at 10/27/18 0940  . escitalopram (LEXAPRO) tablet 10 mg  10 mg Oral Daily Hugelmeyer, Alexis, DO   10 mg at 10/27/18 0941  . famotidine (PEPCID) tablet 20 mg  20 mg Oral Daily Hugelmeyer, Alexis, DO   20 mg at 10/27/18 0941  . insulin aspart (novoLOG) injection 0-5 Units  0-5 Units Subcutaneous QHS Hugelmeyer, Alexis, DO      . insulin aspart  (novoLOG) injection 0-9 Units  0-9 Units Subcutaneous TID WC Hugelmeyer, Alexis, DO   2 Units at 10/27/18 0843  . insulin glargine (LANTUS) injection 14 Units  14 Units Subcutaneous QHS Hugelmeyer, Alexis, DO   14 Units at 10/26/18 2326  . levothyroxine (SYNTHROID) tablet 75 mcg  75 mcg Oral QAC breakfast Hugelmeyer, Alexis, DO   75 mcg at 10/26/18 0546  . lidocaine (LIDODERM) 5 % 1 patch  1 patch Transdermal Q24H Ward, Ozella Almond, PA-C   1 patch at 10/26/18 2104  . lidocaine (XYLOCAINE) 1 % (with pres) injection 10 mL  10 mL Other Once Florencia Reasons, MD      . loperamide (IMODIUM) capsule 2 mg  2 mg Oral PRN Hugelmeyer, Alexis, DO      . magnesium citrate solution 1 Bottle  1 Bottle Oral Once PRN Hugelmeyer, Alexis, DO      . methocarbamol (ROBAXIN) tablet 500 mg  500 mg Oral Q6H PRN Edmisten, Kristie L, PA   500 mg at 10/27/18 1023  . naloxone (NARCAN) 0.4 MG/ML injection           . naloxone (NARCAN) injection 0.4 mg  0.4 mg Intravenous PRN Hollace Hayward K, NP   0.4 mg at 10/27/18 0001  . ondansetron (ZOFRAN) tablet 4 mg  4 mg Oral Q6H PRN Hugelmeyer, Alexis, DO       Or  . ondansetron (ZOFRAN) injection 4 mg  4 mg Intravenous Q6H PRN Hugelmeyer, Alexis, DO   4 mg at 10/26/18 0748  . phenytoin (DILANTIN) chewable tablet 50 mg  50 mg Oral Daily Hugelmeyer, Alexis, DO   50 mg at 10/27/18 0940  . phenytoin (DILANTIN) ER capsule 100 mg  100 mg Oral Daily Hugelmeyer, Alexis, DO   100 mg at 10/27/18 0941  . phenytoin (DILANTIN) ER capsule 200 mg  200 mg Oral QHS Hugelmeyer, Alexis, DO   200 mg at 10/26/18 2326  . polyethylene glycol (MIRALAX / GLYCOLAX) packet 17 g  17 g Oral Daily Florencia Reasons, MD      . polyvinyl alcohol (LIQUIFILM TEARS) 1.4 % ophthalmic solution 2 drop  2 drop Both Eyes BID Hugelmeyer, Alexis, DO   2 drop at 10/27/18 0843  . primidone (MYSOLINE) tablet 100 mg  100 mg Oral BID Hugelmeyer, Alexis, DO   100 mg at 10/27/18 0843  . saccharomyces boulardii (FLORASTOR) capsule 250 mg  250 mg  Oral BID Hugelmeyer, Alexis, DO   250 mg at 10/27/18 0941  . trandolapril (MAVIK) tablet 2 mg  2 mg Oral Daily Hugelmeyer, Alexis, DO   2 mg at 10/27/18 0941   And  . verapamil (CALAN-SR) CR tablet 240 mg  240 mg Oral Daily Hugelmeyer, Alexis, DO   240 mg at 10/27/18 0941   Faxed out to area snf for  placment. Patient has chosen [facility]. Will have bed on Friday per [place] at [facility].  Covid testing acceptable.  Dr. Erlinda Hong notified. Discharge Medications: Please see discharge summary for a list of discharge medications.  Relevant Imaging Results:  Relevant Lab Results:   Additional Information [id number]  Leeroy Cha, RN

## 2018-10-27 NOTE — Progress Notes (Signed)
Patient extremely sleepy, not opening her eyes, only reflex responses. Paged floor coverage.

## 2018-10-27 NOTE — Evaluation (Signed)
Physical Therapy Evaluation Patient Details Name: [name] MRN: [id number] DOB: 01/10/1930 Today's Date: 10/27/2018   History of Present Illness  [name] is a 85 y.o. female with a known history of multiple medical problems including diabetes, hypertension, hyperlipidemia, GERD presents to the emergency department for evaluation of left knee pain status post mechanical fall.  Pt with non-surgical management of L non-displaced patellar fracture.  Clinical Impression  Pt admitted with above diagnosis. Pt currently with functional limitations due to the deficits listed below (see PT Problem List). PT recommending SNF at this time due to required amount of assist and high fall risk, however pt does would like to return home.  Would need 24/7 care if this is the case. Pt will benefit from skilled PT to increase their independence and safety with mobility to allow discharge to the venue listed below.       Follow Up Recommendations SNF;Supervision/Assistance - 24 hour    Equipment Recommendations       Recommendations for Other Services       Precautions / Restrictions Precautions Precautions: Fall Precaution Comments: No orders for weight bearing, per PA note, WBAT Required Braces or Orthoses: Knee Immobilizer - Left Knee Immobilizer - Left: On at all times Restrictions Weight Bearing Restrictions: No LLE Weight Bearing: Weight bearing as tolerated      Mobility  Bed Mobility Overal bed mobility: Needs Assistance Bed Mobility: Supine to Sit     Supine to sit: Min assist     General bed mobility comments: Assist to guide LLE to EOB and lower to ground.  Transfers Overall transfer level: Needs assistance Equipment used: Rolling walker (2 wheeled) Transfers: Sit to/from Stand Sit to Stand: Mod assist         General transfer comment: Cues for LLE management when sitting/standing, hand placement and safety to back up to chair with RW prior to  sitting.  Ambulation/Gait Ambulation/Gait assistance: Min assist;+2 safety/equipment(+2 for chair follow) Gait Distance (Feet): 30 Feet Assistive device: Rolling walker (2 wheeled) Gait Pattern/deviations: Step-to pattern;Decreased step length - right;Decreased stance time - left;Antalgic;Trunk flexed;Decreased weight shift to left Gait velocity: decreased      Stairs            Wheelchair Mobility    Modified Rankin (Stroke Patients Only)       Balance Overall balance assessment: Needs assistance   Sitting balance-Leahy Scale: Normal     Standing balance support: Single extremity supported;Bilateral upper extremity supported Standing balance-Leahy Scale: Poor Standing balance comment: Pt attempted to assist with clothing management following toileting, however had LOB posteriorly and needed increased assist to steady.                             Pertinent Vitals/Pain Pain Assessment: 0-10 Pain Score: 7  Pain Location: left knee Pain Descriptors / Indicators: Aching Pain Intervention(s): Premedicated before session;Monitored during session;Repositioned    Home Living Family/patient expects to be discharged to:: Private residence   Available Help at Discharge: Personal care attendant(caregiver 5d/wk 6 hrs er day except thursdays -4 hrs) Type of Home: House Home Access: Stairs to enter Entrance Stairs-Rails: Psychiatric nurse of Steps: 4 Home Layout: One level Home Equipment: Walker - 2 wheels;Bedside commode;Shower seat - built in      Prior Function Level of Independence: Independent               Journalist, newspaper  Extremity/Trunk Assessment   Upper Extremity Assessment Upper Extremity Assessment: Overall WFL for tasks assessed    Lower Extremity Assessment Lower Extremity Assessment: LLE deficits/detail;Generalized weakness LLE Deficits / Details: Pt with knee immobilizer at all times order, unable to test  formally LLE: Unable to fully assess due to immobilization    Cervical / Trunk Assessment Cervical / Trunk Assessment: Kyphotic  Communication   Communication: No difficulties  Cognition Arousal/Alertness: Awake/alert Behavior During Therapy: WFL for tasks assessed/performed Overall Cognitive Status: Within Functional Limits for tasks assessed                                        General Comments      Exercises     Assessment/Plan    PT Assessment Patient needs continued PT services  PT Problem List Decreased strength;Decreased range of motion;Decreased activity tolerance;Decreased balance;Decreased mobility;Decreased knowledge of use of DME;Decreased safety awareness;Decreased knowledge of precautions;Pain       PT Treatment Interventions DME instruction;Gait training;Functional mobility training;Therapeutic activities;Therapeutic exercise;Balance training;Neuromuscular re-education;Patient/family education    PT Goals (Current goals can be found in the Care Plan section)  Acute Rehab PT Goals Patient Stated Goal: to return home PT Goal Formulation: With patient Time For Goal Achievement: 11/10/18 Potential to Achieve Goals: Fair    Frequency Min 3X/week   Barriers to discharge Decreased caregiver support Pt does not have 24/7 care at this time    Co-evaluation               AM-PAC PT "6 Clicks" Mobility  Outcome Measure Help needed turning from your back to your side while in a flat bed without using bedrails?: A Lot Help needed moving from lying on your back to sitting on the side of a flat bed without using bedrails?: A Lot Help needed moving to and from a bed to a chair (including a wheelchair)?: A Lot Help needed standing up from a chair using your arms (e.g., wheelchair or bedside chair)?: A Lot Help needed to walk in hospital room?: A Lot Help needed climbing 3-5 steps with a railing? : Total 6 Click Score: 11    End of Session  Equipment Utilized During Treatment: Gait belt;Left knee immobilizer Activity Tolerance: Patient limited by pain Patient left: in chair;with call bell/phone within reach;with chair alarm set   PT Visit Diagnosis: Unsteadiness on feet (R26.81);Other abnormalities of gait and mobility (R26.89);Repeated falls (R29.6)    Time: 0962-8366 PT Time Calculation (min) (ACUTE ONLY): 39 min   Charges:   PT Evaluation $PT Eval Moderate Complexity: 1 Mod PT Treatments $Gait Training: 8-22 mins $Therapeutic Activity: 8-22 mins        Cameron Sprang, PT, MPT  10/27/18, 1:03 PM   Paytyn Mesta, Betha Loa 10/27/2018, 1:01 PM

## 2018-10-27 NOTE — Progress Notes (Signed)
PROGRESS NOTE  [name] LNL:[id number] DOB: [birth year] DOA: 10/25/2018 PCP: [name], MD  HPI/Recap of past 24 hours:  Left knee pain improved,  She is weak,   Assessment/Plan: Active Problems:   Left patella fracture  Intractable left knee pain 2/2  A closed nondisplaced fractured patella (not require operative management), large joint effusions after mechanical fall at home: -Patient was in a usual state of health until she was walking to the bathroom when she tripped and fell landing on her knees -S/p therapeutic arthrocentesis x1 one in the ED with 60cc removal, one from ortho on 6/24 with 115cc bloody fluids removed, knee pain has much improved after therapeutic arthrocentesis -per ortho" Continue knee immobilizer with no range of motion of the left leg. Weight-bearing as tolerated to the LLE. Apply frequent ice to left knee to help with swelling. Methocarbamol 500 mg q6 added to PO pain medications to alleviate spasms. She will follow-up in the office with [place] in 2 weeks for repeat x-rays to ensure the fracture has not displaced.    Hyponatremia  From poor oral intake? Start hydration , repeat lab in am  Insulin dependent dm2 , uncontrolled, with hyperglycemia ,  a1c 8.4 Continue insulin , home meds jardiance held in the hospital, plan to resume at discharge Follows [facility] , Roselyn Reef FNP  HTN: home meds tradolapril-verapamil  Home meds lasix held, she appear dehydrated  h/o brain tumor excision 2015, h/o  seizures (on Dilantin) H/o essential tremor on Mysoline She is followed by Neurology Dr Jannifer Franklin  H/o breast cancer 2014 , reports in remission  Code Status: full  Family Communication: patient   Disposition Plan: needs SNF, awaiting for bed   Consultants:  ortho  Procedures:  Bedside arthrocentesisx2  Antibiotics:  none   Objective: BP 124/68 (BP Location: Left Arm)   Pulse 67   Temp 98.1 F (36.7 C)  (Oral)   Resp 16   Ht 5' 7.5" (1.715 m)   Wt 61.7 kg   SpO2 99%   BMI 20.99 kg/m   Intake/Output Summary (Last 24 hours) at 10/27/2018 1609 Last data filed at 10/27/2018 1356 Gross per 24 hour  Intake 751.45 ml  Output 500 ml  Net 251.45 ml   Filed Weights   10/25/18 1810  Weight: 61.7 kg    Exam: Patient is examined daily including today on 10/27/2018, exams remain the same as of yesterday except that has changed    General:  NAD  Cardiovascular: RRR  Respiratory: CTABL  Abdomen: Soft/ND/NT, positive BS  Musculoskeletal: left knee in immobilizer  Neuro: alert, oriented   Data Reviewed: Basic Metabolic Panel: Recent Labs  Lab 10/26/18 0008 10/26/18 0213 10/27/18 0257  NA 133* 134* 129*  K 3.6 3.7 3.6  CL 100 99 97*  CO2 24 25 22   GLUCOSE 237* 226* 193*  BUN 12 11 13   CREATININE 0.63 0.66 0.65  CALCIUM 8.5* 8.5* 8.6*  MG  --   --  1.8   Liver Function Tests: Recent Labs  Lab 10/27/18 0257  AST 12*  ALT 16  ALKPHOS 67  BILITOT 0.5  PROT 5.8*  ALBUMIN 3.4*   No results for input(s): LIPASE, AMYLASE in the last 168 hours. No results for input(s): AMMONIA in the last 168 hours. CBC: Recent Labs  Lab 10/26/18 0008 10/26/18 0213 10/27/18 0257  WBC 12.1* 11.2* 11.1*  NEUTROABS 10.1*  --  8.7*  HGB 12.8 13.0 12.2  HCT  38.5 39.4 37.4  MCV 95.8 96.8 97.9  PLT 251 227 248   Cardiac Enzymes:   No results for input(s): CKTOTAL, CKMB, CKMBINDEX, TROPONINI in the last 168 hours. BNP (last 3 results) No results for input(s): BNP in the last 8760 hours.  ProBNP (last 3 results) No results for input(s): PROBNP in the last 8760 hours.  CBG: Recent Labs  Lab 10/26/18 1132 10/26/18 1638 10/26/18 2157 10/27/18 0731 10/27/18 1139  GLUCAP 179* 195* 156* 152* 139*    Recent Results (from the past 240 hour(s))  SARS Coronavirus 2 (CEPHEID - Performed in [place] hospital lab), Hosp Order     Status: None   Collection Time: 10/26/18 12:26 AM    Specimen: Nasopharyngeal Swab  Result Value Ref Range Status   SARS Coronavirus 2 NEGATIVE NEGATIVE Final    Comment: (NOTE) If result is NEGATIVE SARS-CoV-2 target nucleic acids are NOT DETECTED. The SARS-CoV-2 RNA is generally detectable in upper and lower  respiratory specimens during the acute phase of infection. The lowest  concentration of SARS-CoV-2 viral copies this assay can detect is 250  copies / mL. A negative result does not preclude SARS-CoV-2 infection  and should not be used as the sole basis for treatment or other  patient management decisions.  A negative result Donahue occur with  improper specimen collection / handling, submission of specimen other  than nasopharyngeal swab, presence of viral mutation(s) within the  areas targeted by this assay, and inadequate number of viral copies  (<250 copies / mL). A negative result must be combined with clinical  observations, patient history, and epidemiological information. If result is POSITIVE SARS-CoV-2 target nucleic acids are DETECTED. The SARS-CoV-2 RNA is generally detectable in upper and lower  respiratory specimens dur ing the acute phase of infection.  Positive  results are indicative of active infection with SARS-CoV-2.  Clinical  correlation with patient history and other diagnostic information is  necessary to determine patient infection status.  Positive results do  not rule out bacterial infection or co-infection with other viruses. If result is PRESUMPTIVE POSTIVE SARS-CoV-2 nucleic acids Crance BE PRESENT.   A presumptive positive result was obtained on the submitted specimen  and confirmed on repeat testing.  While 2019 novel coronavirus  (SARS-CoV-2) nucleic acids Rehfeld be present in the submitted sample  additional confirmatory testing Guimaraes be necessary for epidemiological  and / or clinical management purposes  to differentiate between  SARS-CoV-2 and other Sarbecovirus currently known to infect humans.  If  clinically indicated additional testing with an alternate test  methodology [phone]) is advised. The SARS-CoV-2 RNA is generally  detectable in upper and lower respiratory sp ecimens during the acute  phase of infection. The expected result is Negative. Fact Sheet for Patients:  StrictlyIdeas.no Fact Sheet for Healthcare Providers: BankingDealers.co.za This test is not yet approved or cleared by the [place] FDA and has been authorized for detection and/or diagnosis of SARS-CoV-2 by FDA under an Emergency Use Authorization (EUA).  This EUA will remain in effect (meaning this test can be used) for the duration of the COVID-19 declaration under Section 564(b)(1) of the Act, 21 U.S.C. section 360bbb-3(b)(1), unless the authorization is terminated or revoked sooner. Performed at [facility], [place] [address], [place], [place] [address]      Studies: No results found.  Scheduled Meds: . acidophilus  1 capsule Oral TID PC  . cholecalciferol  10,000 Units Oral BID  . enoxaparin (LOVENOX) injection  40  mg Subcutaneous Daily  . escitalopram  10 mg Oral Daily  . famotidine  20 mg Oral Daily  . insulin aspart  0-5 Units Subcutaneous QHS  . insulin aspart  0-9 Units Subcutaneous TID WC  . insulin glargine  14 Units Subcutaneous QHS  . levothyroxine  75 mcg Oral QAC breakfast  . lidocaine  1 patch Transdermal Q24H  . lidocaine  10 mL Other Once  . phenytoin  50 mg Oral Daily  . phenytoin  100 mg Oral Daily  . phenytoin  200 mg Oral QHS  . polyethylene glycol  17 g Oral Daily  . polyvinyl alcohol  2 drop Both Eyes BID  . primidone  100 mg Oral BID  . saccharomyces boulardii  250 mg Oral BID  . trandolapril  2 mg Oral Daily   And  . verapamil  240 mg Oral Daily    Continuous Infusions: . sodium chloride 75 mL/hr at 10/27/18 1356     Time spent: 37mins I have personally reviewed and interpreted on  10/27/2018  daily labs,  imagings as discussed above under date review session and assessment and plans.  I reviewed all nursing notes, pharmacy notes, consultant notes,  vitals, pertinent old records  I have discussed plan of care as described above with RN , patient  on 10/27/2018   Florencia Reasons MD, PhD  [facility] Pager [phone]. If 7PM-7AM, please contact night-coverage at [email or web address], password [facility] 10/27/2018, 4:09 PM  LOS: 1 day

## 2018-10-28 DIAGNOSIS — E876 Hypokalemia: Secondary | ICD-10-CM

## 2018-10-28 LAB — CBC WITH DIFFERENTIAL/PLATELET
Abs Immature Granulocytes: 0.02 10*3/uL (ref 0.00–0.07)
Basophils Absolute: 0 10*3/uL (ref 0.0–0.1)
Basophils Relative: 0 %
Eosinophils Absolute: 0 10*3/uL (ref 0.0–0.5)
Eosinophils Relative: 1 %
HCT: 31.9 % — ABNORMAL LOW (ref 36.0–46.0)
Hemoglobin: 10.3 g/dL — ABNORMAL LOW (ref 12.0–15.0)
Immature Granulocytes: 0 %
Lymphocytes Relative: 26 %
Lymphs Abs: 1.6 10*3/uL (ref 0.7–4.0)
MCH: 31.2 pg (ref 26.0–34.0)
MCHC: 32.3 g/dL (ref 30.0–36.0)
MCV: 96.7 fL (ref 80.0–100.0)
Monocytes Absolute: 0.7 10*3/uL (ref 0.1–1.0)
Monocytes Relative: 11 %
Neutro Abs: 3.7 10*3/uL (ref 1.7–7.7)
Neutrophils Relative %: 62 %
Platelets: 218 10*3/uL (ref 150–400)
RBC: 3.3 MIL/uL — ABNORMAL LOW (ref 3.87–5.11)
RDW: 13.2 % (ref 11.5–15.5)
WBC: 6.1 10*3/uL (ref 4.0–10.5)
nRBC: 0 % (ref 0.0–0.2)

## 2018-10-28 LAB — BASIC METABOLIC PANEL
Anion gap: 6 (ref 5–15)
BUN: 12 mg/dL (ref 8–23)
CO2: 25 mmol/L (ref 22–32)
Calcium: 7.9 mg/dL — ABNORMAL LOW (ref 8.9–10.3)
Chloride: 102 mmol/L (ref 98–111)
Creatinine, Ser: 0.6 mg/dL (ref 0.44–1.00)
GFR calc Af Amer: >60 mL/min (ref >60)
GFR calc non Af Amer: >60 mL/min (ref >60)
Glucose, Bld: 169 mg/dL — ABNORMAL HIGH (ref 70–99)
Potassium: 3.3 mmol/L — ABNORMAL LOW (ref 3.5–5.1)
Sodium: 133 mmol/L — ABNORMAL LOW (ref 135–145)

## 2018-10-28 LAB — GLUCOSE, CAPILLARY
Glucose-Capillary: 107 mg/dL — ABNORMAL HIGH (ref 70–99)
Glucose-Capillary: 158 mg/dL — ABNORMAL HIGH (ref 70–99)
Glucose-Capillary: 154 mg/dL — ABNORMAL HIGH (ref 70–99)
Glucose-Capillary: 175 mg/dL — ABNORMAL HIGH (ref 70–99)

## 2018-10-28 LAB — MAGNESIUM: Magnesium: 1.7 mg/dL (ref 1.7–2.4)

## 2018-10-28 MED ORDER — SODIUM CHLORIDE 0.9 % IV SOLN
INTRAVENOUS | Status: AC
  Administered 2018-10-28 – 2018-10-29 (×3): via INTRAVENOUS

## 2018-10-28 MED ORDER — SENNA 8.6 MG PO TABS
1.0000 | ORAL_TABLET | Freq: Every day | ORAL | Status: DC
  Administered 2018-10-28: 21:00:00 8.6 mg via ORAL
  Filled 2018-10-28: qty 1

## 2018-10-28 MED ORDER — MAGNESIUM SULFATE 2 GM/50ML IV SOLN
2.0000 g | Freq: Once | INTRAVENOUS | Status: AC
  Administered 2018-10-28: 2 g via INTRAVENOUS
  Filled 2018-10-28: qty 50

## 2018-10-28 MED ORDER — POTASSIUM CHLORIDE CRYS ER 20 MEQ PO TBCR
40.0000 meq | EXTENDED_RELEASE_TABLET | Freq: Once | ORAL | Status: AC
  Administered 2018-10-28: 40 meq via ORAL
  Filled 2018-10-28: qty 2

## 2018-10-28 NOTE — Progress Notes (Signed)
PROGRESS NOTE  [name] XIP:[id number] DOB: 24-Dec-1929 DOA: 10/25/2018 PCP: [name], MD  HPI/Recap of past 24 hours:  Left knee pain improved,  She is weak,  No fever, awaiting snf placement  Assessment/Plan: Active Problems:   Insulin dependent diabetes mellitus ([place])   Left patella fracture   Effusion of left knee joint   Hyponatremia  Intractable left knee pain 2/2  A closed nondisplaced fractured patella (not require operative management), large joint effusions after mechanical fall at home: -Patient was in a usual state of health until she was walking to the bathroom when she tripped and fell landing on her knees -S/p therapeutic arthrocentesis x1 one in the ED with 60cc removal, one from ortho on 6/24 with 115cc bloody fluids removed, knee pain has much improved after therapeutic arthrocentesis -per ortho" Continue knee immobilizer with no range of motion of the left leg. Weight-bearing as tolerated to the LLE. Apply frequent ice to left knee to help with swelling. Methocarbamol 500 mg q6 added to PO pain medications to alleviate spasms. She will follow-up in the office with [place] in 2 weeks for repeat x-rays to ensure the fracture has not displaced.    Hyponatremia  From poor oral intake? Start hydration , repeat lab in am  Hypokalemia/hypomagnesemia: Replace k and mag, repeat in am  Insulin dependent dm2 , uncontrolled, with hyperglycemia ,  a1c 8.4 Continue insulin , home meds jardiance held in the hospital, plan to resume at discharge Follows with endocrinology at [facility] , Roselyn Reef FNP  HTN: home meds tradolapril-verapamil  Home meds lasix held, she appear dehydrated  h/o brain tumor excision 2015, h/o  seizures (on Dilantin) H/o essential tremor on Mysoline She is followed by Neurology Dr Jannifer Franklin  H/o breast cancer 2014 , reports in remission  Code Status: full  Family Communication: patient   Disposition Plan: needs SNF, needs three  nights in the hospital per insurance requirement, d/c to snf on 6/27   Consultants:  ortho  Procedures:  Bedside arthrocentesisx2  Antibiotics:  none   Objective: BP 120/62   Pulse 78   Temp 97.9 F (36.6 C) (Oral)   Resp 18   Ht 5' 7.5" (1.715 m)   Wt 61.7 kg   SpO2 97%   BMI 20.99 kg/m   Intake/Output Summary (Last 24 hours) at 10/28/2018 1135 Last data filed at 10/28/2018 0853 Gross per 24 hour  Intake 2186.63 ml  Output 2300 ml  Net -113.37 ml   Filed Weights   10/25/18 1810  Weight: 61.7 kg    Exam: Patient is examined daily including today on 10/28/2018, exams remain the same as of yesterday except that has changed    General:  NAD  Cardiovascular: RRR  Respiratory: CTABL  Abdomen: Soft/ND/NT, positive BS  Musculoskeletal: left knee in immobilizer  Neuro: alert, oriented   Data Reviewed: Basic Metabolic Panel: Recent Labs  Lab 10/26/18 0008 10/26/18 0213 10/27/18 0257 10/28/18 0308  NA 133* 134* 129* 133*  K 3.6 3.7 3.6 3.3*  CL 100 99 97* 102  CO2 24 25 22 25   GLUCOSE 237* 226* 193* 169*  BUN 12 11 13 12   CREATININE 0.63 0.66 0.65 0.60  CALCIUM 8.5* 8.5* 8.6* 7.9*  MG  --   --  1.8 1.7   Liver Function Tests: Recent Labs  Lab 10/27/18 0257  AST 12*  ALT 16  ALKPHOS 67  BILITOT 0.5  PROT 5.8*  ALBUMIN 3.4*   No results  for input(s): LIPASE, AMYLASE in the last 168 hours. No results for input(s): AMMONIA in the last 168 hours. CBC: Recent Labs  Lab 10/26/18 0008 10/26/18 0213 10/27/18 0257 10/28/18 0308  WBC 12.1* 11.2* 11.1* 6.1  NEUTROABS 10.1*  --  8.7* 3.7  HGB 12.8 13.0 12.2 10.3*  HCT 38.5 39.4 37.4 31.9*  MCV 95.8 96.8 97.9 96.7  PLT 251 227 248 218   Cardiac Enzymes:   No results for input(s): CKTOTAL, CKMB, CKMBINDEX, TROPONINI in the last 168 hours. BNP (last 3 results) No results for input(s): BNP in the last 8760 hours.  ProBNP (last 3 results) No results for input(s): PROBNP in the last 8760  hours.  CBG: Recent Labs  Lab 10/27/18 1139 10/27/18 1630 10/27/18 2120 10/28/18 0728 10/28/18 1132  GLUCAP 139* 122* 120* 107* 158*    Recent Results (from the past 240 hour(s))  SARS Coronavirus 2 (CEPHEID - Performed in [place] hospital lab), Hosp Order     Status: None   Collection Time: 10/26/18 12:26 AM   Specimen: Nasopharyngeal Swab  Result Value Ref Range Status   SARS Coronavirus 2 NEGATIVE NEGATIVE Final    Comment: (NOTE) If result is NEGATIVE SARS-CoV-2 target nucleic acids are NOT DETECTED. The SARS-CoV-2 RNA is generally detectable in upper and lower  respiratory specimens during the acute phase of infection. The lowest  concentration of SARS-CoV-2 viral copies this assay can detect is 250  copies / mL. A negative result does not preclude SARS-CoV-2 infection  and should not be used as the sole basis for treatment or other  patient management decisions.  A negative result Raczkowski occur with  improper specimen collection / handling, submission of specimen other  than nasopharyngeal swab, presence of viral mutation(s) within the  areas targeted by this assay, and inadequate number of viral copies  (<250 copies / mL). A negative result must be combined with clinical  observations, patient history, and epidemiological information. If result is POSITIVE SARS-CoV-2 target nucleic acids are DETECTED. The SARS-CoV-2 RNA is generally detectable in upper and lower  respiratory specimens dur ing the acute phase of infection.  Positive  results are indicative of active infection with SARS-CoV-2.  Clinical  correlation with patient history and other diagnostic information is  necessary to determine patient infection status.  Positive results do  not rule out bacterial infection or co-infection with other viruses. If result is PRESUMPTIVE POSTIVE SARS-CoV-2 nucleic acids Verstraete BE PRESENT.   A presumptive positive result was obtained on the submitted specimen  and confirmed  on repeat testing.  While 2019 novel coronavirus  (SARS-CoV-2) nucleic acids Gero be present in the submitted sample  additional confirmatory testing Ruminski be necessary for epidemiological  and / or clinical management purposes  to differentiate between  SARS-CoV-2 and other Sarbecovirus currently known to infect humans.  If clinically indicated additional testing with an alternate test  methodology [phone]) is advised. The SARS-CoV-2 RNA is generally  detectable in upper and lower respiratory sp ecimens during the acute  phase of infection. The expected result is Negative. Fact Sheet for Patients:  StrictlyIdeas.no Fact Sheet for Healthcare Providers: BankingDealers.co.za This test is not yet approved or cleared by the [place] FDA and has been authorized for detection and/or diagnosis of SARS-CoV-2 by FDA under an Emergency Use Authorization (EUA).  This EUA will remain in effect (meaning this test can be used) for the duration of the COVID-19 declaration under Section 564(b)(1) of the Act, 21 U.S.C. section  360bbb-3(b)(1), unless the authorization is terminated or revoked sooner. Performed at [facility], [place] [address], [place], [place] [address]      Studies: No results found.  Scheduled Meds: . acidophilus  1 capsule Oral TID PC  . cholecalciferol  10,000 Units Oral BID  . enoxaparin (LOVENOX) injection  40 mg Subcutaneous Daily  . escitalopram  10 mg Oral Daily  . famotidine  20 mg Oral Daily  . insulin aspart  0-5 Units Subcutaneous QHS  . insulin aspart  0-9 Units Subcutaneous TID WC  . insulin glargine  14 Units Subcutaneous QHS  . levothyroxine  75 mcg Oral QAC breakfast  . lidocaine  1 patch Transdermal Q24H  . lidocaine  10 mL Other Once  . phenytoin  50 mg Oral Daily  . phenytoin  100 mg Oral Daily  . phenytoin  200 mg Oral QHS  . polyethylene glycol  17 g Oral Daily  . polyvinyl alcohol  2  drop Both Eyes BID  . primidone  100 mg Oral BID  . saccharomyces boulardii  250 mg Oral BID  . trandolapril  2 mg Oral Daily   And  . verapamil  240 mg Oral Daily    Continuous Infusions:    Time spent: 46mins I have personally reviewed and interpreted on  10/28/2018 daily labs,  imagings as discussed above under date review session and assessment and plans.  I reviewed all nursing notes, pharmacy notes, consultant notes,  vitals, pertinent old records  I have discussed plan of care as described above with RN , patient  on 10/28/2018   Florencia Reasons MD, PhD  [facility] Pager [phone]. If 7PM-7AM, please contact night-coverage at [email or web address], password [facility] 10/28/2018, 11:35 AM  LOS: 2 days

## 2018-10-28 NOTE — Progress Notes (Signed)
Physical Therapy Treatment Patient Details Name: [name] MRN: [id number] DOB: 11/10/1929 Today's Date: 10/28/2018    History of Present Illness [name] is a 85 y.o. female with a known history of multiple medical problems including diabetes, hypertension, hyperlipidemia, GERD presents to the emergency department for evaluation of left knee pain status post mechanical fall.  Pt with non-surgical management of L non-displaced patellar fracture.    PT Comments    Pt progressing, improved gait stability and tolerance today; continue to recommend SNF post acute.  Continue PT POC   Follow Up Recommendations  SNF;Supervision/Assistance - 24 hour     Equipment Recommendations  None recommended by PT    Recommendations for Other Services       Precautions / Restrictions Precautions Precautions: Fall Precaution Comments: No orders for weight bearing, per PA note, WBAT Required Braces or Orthoses: Knee Immobilizer - Left Knee Immobilizer - Left: On at all times Restrictions Weight Bearing Restrictions: No LLE Weight Bearing: Weight bearing as tolerated    Mobility  Bed Mobility Overal bed mobility: Needs Assistance Bed Mobility: Supine to Sit     Supine to sit: Min assist     General bed mobility comments: Assist to guide LLE to EOB and lower to ground.  Transfers Overall transfer level: Needs assistance Equipment used: Rolling walker (2 wheeled) Transfers: Sit to/from Stand Sit to Stand: Min assist         General transfer comment: Cues for LLE management when sitting/standing, hand placement and safety to back up to chair with RW prior to sitting.  Ambulation/Gait Ambulation/Gait assistance: Min assist;Min guard Gait Distance (Feet): 30 Feet Assistive device: Rolling walker (2 wheeled) Gait Pattern/deviations: Step-to pattern;Decreased step length - right;Decreased stance time - left;Antalgic;Trunk flexed;Decreased weight shift to left Gait velocity: decreased        Stairs             Wheelchair Mobility    Modified Rankin (Stroke Patients Only)       Balance                                            Cognition Arousal/Alertness: Awake/alert Behavior During Therapy: WFL for tasks assessed/performed Overall Cognitive Status: Within Functional Limits for tasks assessed                                 General Comments: incontinent of urine       Exercises General Exercises - Lower Extremity Ankle Circles/Pumps: AROM;Both;15 reps    General Comments        Pertinent Vitals/Pain Pain Assessment: 0-10 Pain Score: 5  Pain Location: left knee Pain Descriptors / Indicators: Aching Pain Intervention(s): Limited activity within patient's tolerance;Monitored during session;Repositioned;Premedicated before session    Home Living                      Prior Function            PT Goals (current goals can now be found in the care plan section) Acute Rehab PT Goals Patient Stated Goal: to return home PT Goal Formulation: With patient Time For Goal Achievement: 11/10/18 Potential to Achieve Goals: Fair Progress towards PT goals: Progressing toward goals    Frequency    Min 3X/week      PT  Plan Current plan remains appropriate    Co-evaluation              AM-PAC PT "6 Clicks" Mobility   Outcome Measure  Help needed turning from your back to your side while in a flat bed without using bedrails?: A Lot Help needed moving from lying on your back to sitting on the side of a flat bed without using bedrails?: A Little Help needed moving to and from a bed to a chair (including a wheelchair)?: A Little Help needed standing up from a chair using your arms (e.g., wheelchair or bedside chair)?: A Little Help needed to walk in hospital room?: A Little Help needed climbing 3-5 steps with a railing? : A Lot 6 Click Score: 16    End of Session Equipment Utilized During  Treatment: Gait belt;Left knee immobilizer Activity Tolerance: Patient tolerated treatment well Patient left: in chair;with call bell/phone within reach;with chair alarm set Nurse Communication: Mobility status PT Visit Diagnosis: Unsteadiness on feet (R26.81);Other abnormalities of gait and mobility (R26.89);Repeated falls (R29.6)     Time: 6438-3779 PT Time Calculation (min) (ACUTE ONLY): 29 min  Charges:  $Gait Training: 8-22 mins $Therapeutic Activity: 8-22 mins                     [name], PT  Pager: [phone] Acute Rehab Dept [facility]): [phone]   10/28/2018 '   [name] 10/28/2018, 12:56 PM

## 2018-10-28 NOTE — Care Management Important Message (Signed)
Important Message  Patient Details IM Letter given to Kathrin Greathouse SW to present to the Patient Name: [name] MRN: [id number] Date of Birth: 1929/07/08   Medicare Important Message Given:  Yes     Kerin Salen 10/28/2018, 12:15 PM

## 2018-10-29 DIAGNOSIS — R41841 Cognitive communication deficit: Secondary | ICD-10-CM | POA: Diagnosis not present

## 2018-10-29 DIAGNOSIS — D6489 Other specified anemias: Secondary | ICD-10-CM | POA: Diagnosis not present

## 2018-10-29 DIAGNOSIS — Z9181 History of falling: Secondary | ICD-10-CM | POA: Diagnosis not present

## 2018-10-29 DIAGNOSIS — M2550 Pain in unspecified joint: Secondary | ICD-10-CM | POA: Diagnosis not present

## 2018-10-29 DIAGNOSIS — M25462 Effusion, left knee: Secondary | ICD-10-CM | POA: Diagnosis not present

## 2018-10-29 DIAGNOSIS — K580 Irritable bowel syndrome with diarrhea: Secondary | ICD-10-CM | POA: Diagnosis not present

## 2018-10-29 DIAGNOSIS — G40909 Epilepsy, unspecified, not intractable, without status epilepticus: Secondary | ICD-10-CM | POA: Diagnosis not present

## 2018-10-29 DIAGNOSIS — Z79899 Other long term (current) drug therapy: Secondary | ICD-10-CM | POA: Diagnosis not present

## 2018-10-29 DIAGNOSIS — R2689 Other abnormalities of gait and mobility: Secondary | ICD-10-CM | POA: Diagnosis not present

## 2018-10-29 DIAGNOSIS — I10 Essential (primary) hypertension: Secondary | ICD-10-CM | POA: Diagnosis not present

## 2018-10-29 DIAGNOSIS — G4089 Other seizures: Secondary | ICD-10-CM | POA: Diagnosis not present

## 2018-10-29 DIAGNOSIS — E118 Type 2 diabetes mellitus with unspecified complications: Secondary | ICD-10-CM | POA: Diagnosis not present

## 2018-10-29 DIAGNOSIS — E876 Hypokalemia: Secondary | ICD-10-CM | POA: Diagnosis not present

## 2018-10-29 DIAGNOSIS — R278 Other lack of coordination: Secondary | ICD-10-CM | POA: Diagnosis not present

## 2018-10-29 DIAGNOSIS — E119 Type 2 diabetes mellitus without complications: Secondary | ICD-10-CM | POA: Diagnosis not present

## 2018-10-29 DIAGNOSIS — R2681 Unsteadiness on feet: Secondary | ICD-10-CM | POA: Diagnosis not present

## 2018-10-29 DIAGNOSIS — E871 Hypo-osmolality and hyponatremia: Secondary | ICD-10-CM | POA: Diagnosis not present

## 2018-10-29 DIAGNOSIS — S82002D Unspecified fracture of left patella, subsequent encounter for closed fracture with routine healing: Secondary | ICD-10-CM | POA: Diagnosis not present

## 2018-10-29 DIAGNOSIS — E038 Other specified hypothyroidism: Secondary | ICD-10-CM | POA: Diagnosis not present

## 2018-10-29 DIAGNOSIS — M6281 Muscle weakness (generalized): Secondary | ICD-10-CM | POA: Diagnosis not present

## 2018-10-29 DIAGNOSIS — Z794 Long term (current) use of insulin: Secondary | ICD-10-CM | POA: Diagnosis not present

## 2018-10-29 DIAGNOSIS — W19XXXA Unspecified fall, initial encounter: Secondary | ICD-10-CM | POA: Diagnosis not present

## 2018-10-29 DIAGNOSIS — Z7401 Bed confinement status: Secondary | ICD-10-CM | POA: Diagnosis not present

## 2018-10-29 DIAGNOSIS — R258 Other abnormal involuntary movements: Secondary | ICD-10-CM | POA: Diagnosis not present

## 2018-10-29 DIAGNOSIS — S82002S Unspecified fracture of left patella, sequela: Secondary | ICD-10-CM | POA: Diagnosis not present

## 2018-10-29 DIAGNOSIS — F418 Other specified anxiety disorders: Secondary | ICD-10-CM | POA: Diagnosis not present

## 2018-10-29 DIAGNOSIS — I469 Cardiac arrest, cause unspecified: Secondary | ICD-10-CM | POA: Diagnosis not present

## 2018-10-29 DIAGNOSIS — E1165 Type 2 diabetes mellitus with hyperglycemia: Secondary | ICD-10-CM | POA: Diagnosis not present

## 2018-10-29 DIAGNOSIS — S82002A Unspecified fracture of left patella, initial encounter for closed fracture: Secondary | ICD-10-CM | POA: Diagnosis not present

## 2018-10-29 LAB — BASIC METABOLIC PANEL
Anion gap: 6 (ref 5–15)
BUN: 12 mg/dL (ref 8–23)
CO2: 22 mmol/L (ref 22–32)
Calcium: 7.9 mg/dL — ABNORMAL LOW (ref 8.9–10.3)
Chloride: 106 mmol/L (ref 98–111)
Creatinine, Ser: 0.59 mg/dL (ref 0.44–1.00)
GFR calc Af Amer: >60 mL/min (ref >60)
GFR calc non Af Amer: >60 mL/min (ref >60)
Glucose, Bld: 226 mg/dL — ABNORMAL HIGH (ref 70–99)
Potassium: 4.4 mmol/L (ref 3.5–5.1)
Sodium: 134 mmol/L — ABNORMAL LOW (ref 135–145)

## 2018-10-29 LAB — CBC WITH DIFFERENTIAL/PLATELET
Abs Immature Granulocytes: 0.02 10*3/uL (ref 0.00–0.07)
Basophils Absolute: 0 10*3/uL (ref 0.0–0.1)
Basophils Relative: 1 %
Eosinophils Absolute: 0.1 10*3/uL (ref 0.0–0.5)
Eosinophils Relative: 1 %
HCT: 30.3 % — ABNORMAL LOW (ref 36.0–46.0)
Hemoglobin: 9.6 g/dL — ABNORMAL LOW (ref 12.0–15.0)
Immature Granulocytes: 0 %
Lymphocytes Relative: 27 %
Lymphs Abs: 1.6 10*3/uL (ref 0.7–4.0)
MCH: 31.1 pg (ref 26.0–34.0)
MCHC: 31.7 g/dL (ref 30.0–36.0)
MCV: 98.1 fL (ref 80.0–100.0)
Monocytes Absolute: 0.5 10*3/uL (ref 0.1–1.0)
Monocytes Relative: 9 %
Neutro Abs: 3.7 10*3/uL (ref 1.7–7.7)
Neutrophils Relative %: 62 %
Platelets: 231 10*3/uL (ref 150–400)
RBC: 3.09 MIL/uL — ABNORMAL LOW (ref 3.87–5.11)
RDW: 13.4 % (ref 11.5–15.5)
WBC: 5.9 10*3/uL (ref 4.0–10.5)
nRBC: 0 % (ref 0.0–0.2)

## 2018-10-29 LAB — GLUCOSE, CAPILLARY
Glucose-Capillary: 164 mg/dL — ABNORMAL HIGH (ref 70–99)
Glucose-Capillary: 169 mg/dL — ABNORMAL HIGH (ref 70–99)

## 2018-10-29 LAB — MAGNESIUM: Magnesium: 1.9 mg/dL (ref 1.7–2.4)

## 2018-10-29 NOTE — Plan of Care (Signed)
  Problem: Education: Goal: Knowledge of General Education information will improve Description: Including pain rating scale, medication(s)/side effects and non-pharmacologic comfort measures Outcome: Adequate for Discharge   Problem: Health Behavior/Discharge Planning: Goal: Ability to manage health-related needs will improve Outcome: Adequate for Discharge   Problem: Clinical Measurements: Goal: Ability to maintain clinical measurements within normal limits will improve Outcome: Adequate for Discharge Goal: Will remain free from infection Outcome: Adequate for Discharge Goal: Cardiovascular complication will be avoided Outcome: Adequate for Discharge   Problem: Activity: Goal: Risk for activity intolerance will decrease Outcome: Adequate for Discharge   Problem: Nutrition: Goal: Adequate nutrition will be maintained Outcome: Adequate for Discharge   Problem: Coping: Goal: Level of anxiety will decrease Outcome: Adequate for Discharge   Problem: Elimination: Goal: Will not experience complications related to bowel motility Outcome: Adequate for Discharge   Problem: Pain Managment: Goal: General experience of comfort will improve Outcome: Adequate for Discharge   Problem: Safety: Goal: Ability to remain free from injury will improve Outcome: Adequate for Discharge   Problem: Skin Integrity: Goal: Risk for impaired skin integrity will decrease Outcome: Adequate for Discharge

## 2018-10-29 NOTE — Progress Notes (Signed)
CSW attempted to call the pt on the pt's phone and was unable to reach her.  CSW called pt's niece Williams,Carolyn at [phone] and updated her and provided the pt's niece with the admission's person.  CSW will continue to follow for D/C needs.  Alphonse Guild. Tessah Patchen, LCSW, LCAS, CSI Transitions of Care Clinical Social Worker Care Coordination Department Ph: [phone]

## 2018-10-29 NOTE — Progress Notes (Signed)
   Subjective:    recheck left patella fracture/effusion s/p arthrocentesis Pt still c/o mild stiffness and trouble activating her quad Pain is improved greatly after draining blood/fluid Denies any new symptoms or issues  Patient reports pain as moderate.  Objective:   VITALS:   Vitals:   10/28/18 2208 10/29/18 0556  BP: 129/61 134/77  Pulse: 62 75  Resp: 16 14  Temp: 98.1 F (36.7 C) (!) 97.5 F (36.4 C)  SpO2: 97% 100%    Left knee currently in knee immobilizer lidoderm patch in place Mild effusion remains in left knee Pain with quad activation on the left  nv intact distally  LABS Recent Labs    10/27/18 0257 10/28/18 0308 10/29/18 0246  HGB 12.2 10.3* 9.6*  HCT 37.4 31.9* 30.3*  WBC 11.1* 6.1 5.9  PLT 248 218 231    Recent Labs    10/27/18 0257 10/28/18 0308 10/29/18 0246  NA 129* 133* 134*  K 3.6 3.3* 4.4  BUN 13 12 12   CREATININE 0.65 0.60 0.59  GLUCOSE 193* 169* 226*     Assessment/Plan:   Left non displaced patella fracture Recommend continued conservative care with knee immobilizer and pain management [place] benefit from some occasional icing to aid with swelling and effusion Will continue to monitor her progress Height weight bear as tolerated with the knee immobilizer in place     Brad Lianne Carreto PA-C, [place] is now [facility]  [place] [address], [address], [place], [place] [address] Phone: [phone] [email or web address] Facebook  [employer]

## 2018-10-29 NOTE — Progress Notes (Signed)
CSW sent pt's D/C summary via the hub and called pt's RN to complete the Med Necessity Form.  CSW will continue to follow for D/C needs.  Alphonse Guild. Guyla Bless, LCSW, LCAS, CSI Transitions of Care Clinical Social Worker Care Coordination Department Ph: [phone]

## 2018-10-29 NOTE — Discharge Summary (Signed)
Discharge Summary  [name] MHD:[id number] DOB: January 28, 1930  PCP: [name], MD  Admit date: 10/25/2018 Discharge date: 10/29/2018  Time spent: 50mns, more than 50% time spent on coordination of care.  Recommendations for Outpatient Follow-up:  1. F/u with SNF MD  for hospital discharge follow up, repeat cbc/bmp at follow up 2. F/u with orthopedics Dr CTheda Sers in one week  Discharge Diagnoses:  Active Hospital Problems   Diagnosis Date Noted   Hypokalemia    Effusion of left knee joint    Hyponatremia    Left patella fracture 10/26/2018   Insulin dependent diabetes mellitus ([facility] 10/29/2006    Resolved Hospital Problems  No resolved problems to display.    Discharge Condition: stable  Diet recommendation: heart healthy/carb modified  Filed Weights   10/25/18 1810  Weight: 61.7 kg    History of present illness: (per admitting MD Dr HAra Kussmaul Primary Care Physician: DFanny Bien MD  Chief Complaint:     Chief Complaint  Patient presents with   Fall    HPI: [name] is a 85y.o. female with a known history of multiple medical problems including diabetes, hypertension, hyperlipidemia, GERD presents to the emergency department for evaluation of left knee pain status post mechanical fall.  Patient was in a usual state of health until she was walking to the bathroom when she tripped and fell landing on her knees.  She did have head trauma but no loss of consciousness.  She complains of left knee pain, difficulty with ambulation.  Patient denies any preceding symptoms such as dizziness, lightheadedness, shortness of breath, palpitations..  Patient denies fevers/chills, weakness, dizziness, chest pain, shortness of breath, N/V/C/D, abdominal pain, dysuria/frequency, changes in mental status.   EMS/ED Course: Patient received fentanyl and knee immobilizer. Medical admission has been requested for further management of intractable pain and gait  instability.  Patient lives alone.    Hospital Course:  Active Problems:   Insulin dependent diabetes mellitus (HCC)   Left patella fracture   Effusion of left knee joint   Hyponatremia   Hypokalemia    Intractable left knee pain 2/2  A closed nondisplaced fractured patella (does not require operative management), large joint effusions after mechanical fall at home: -Patient was in a usual state of health until she was walking to the bathroom when she tripped and fell landing on her knees -S/p therapeutic arthrocentesis x1 one in the ED with 60cc removal, one from ortho on 6/24 with 115cc bloody fluids removed, knee pain has much improved after therapeutic arthrocentesis -per ortho" Continue knee immobilizer with no range of motion of the left leg. Weight-bearing as tolerated to the LLE.Apply frequent ice to left knee to help with swelling.Methocarbamol 500 mg q6 added to PO pain medications to alleviate spasms.She will follow-up in the office with [facility] 1 week for repeat x-rays to ensure the fracture has not displaced.   Hyponatremia  Sodium went down to 129 Likely From poor oral intake and dehydration Home meds lasix discontinued  Sodium normalized with hydration, sodium is 134 at discharge   Hypokalemia/hypomagnesemia: Replaced and normalized  Insulin dependent dm2 , uncontrolled, with hyperglycemia ,  a1c 8.4 Continue insulin  (lantus, humalog)  home meds jardiance held in the hospital,  resumed at discharge [facility] JRoselyn ReefFNP  HTN: home meds tradolapril-verapamil  Home meds lasix discontinued due to prone to have dehydration and hyponatremia  h/o brain tumor excision 2015, h/o  seizures : on Dilantin H/o essential tremor on [place] She is followed by Neurology Dr Jannifer Franklin  H/o breast cancer 2014 , reports in remission  Code Status: full  Family Communication: patient   Disposition Plan:  snf on  6/27   Consultants:  ortho  Procedures:  Bedside arthrocentesisx2  Antibiotics:  none  Discharge Exam: BP (!) 126/59    Pulse 65    Temp (!) 97.5 F (36.4 C) (Oral)    Resp 14    Ht 5' 7.5" (1.715 m)    Wt 61.7 kg    SpO2 100%    BMI 20.99 kg/m   General: NAD Cardiovascular: RRR Respiratory: CTABL Abdomen: Soft/ND/NT, positive BS Musculoskeletal: left knee in immobilizer Neuro: alert, oriented   Discharge Instructions You were cared for by a hospitalist during your hospital stay. If you have any questions about your discharge medications or the care you received while you were in the hospital after you are discharged, you can call the unit and asked to speak with the hospitalist on call if the hospitalist that took care of you is not available. Once you are discharged, your primary care physician will handle any further medical issues. Please note that NO REFILLS for any discharge medications will be authorized once you are discharged, as it is imperative that you return to your primary care physician (or establish a relationship with a primary care physician if you do not have one) for your aftercare needs so that they can reassess your need for medications and monitor your lab values.  Discharge Instructions    Diet Carb Modified   Complete by: As directed    Increase activity slowly   Complete by: As directed    Keep knee immobilizer , weight bearing as tolerated left knee     Allergies as of 10/29/2018      Reactions   Bystolic [nebivolol Hcl] Other (See Comments)   "extreme weakness, heaviness in legs & arms, swelling in legs/arms/face, swollen abdomen, pain in bladder, feet pain, soreness in chest"   Cholestyramine    "itching rash on stomach, bloated, nausea, vomiting, sleeplessness, extreme pain in arms"   Hydrazine Yellow [tartrazine] Other (See Comments)   "does not reduce high blood pressure, pain in arm, high pressure, felt like I was on verge of heart  attack, really weak"   Morphine Other (See Comments)   Feels morbid, weak, still in pain   Niacin Palpitations   Fast heart beat   Niaspan [niacin Er] Palpitations, Other (See Comments)   "fast heart beat, high blood pressure"   Norvasc [amlodipine Besylate] Other (See Comments)   "extreme fluid retention/pain)   Optivar [azelastine Hcl] Photosensitivity   Repaglinide Hives   Sular [nisoldipine Er] Other (See Comments)   "severe headaches, swelling eyes, hands, feet, shortness of breath, weak, flushed face, brain boiling, fluid retention, high blood sugar, nervous, heart fast beating"   Tegretol [carbamazepine] Other (See Comments)   Blood poisoning   Telmisartan Other (See Comments)   "headache, difficulty urinating, high blood sugar, fluid retention"   Cefdinir Swelling   Vaginal irritation, breathing,    Clonidine Other (See Comments)   Dry mouth, fluid retention   Clonidine Hydrochloride Other (See Comments)   Dry mouth, fluid retention   Codeine Nausea And Vomiting   Ezetimibe Other (See Comments)   Made weak   Naproxen Other (See Comments)   Shrinks bladder   Ziac [bisoprolol-hydrochlorothiazide] Other (See Comments)   "stopped urination"   Elavil [amitriptyline]  Other (See Comments)   Gave Pt nightmares   Hydralazine    Iodine    Keppra [levetiracetam] Other (See Comments)   Shaking   Lamictal [lamotrigine]    itching   Lyrica [pregabalin] Swelling   Pseudoephedrine    Topamax [topiramate]    Dry eyes   Ace Inhibitors Other (See Comments)   unknown   Actonel [risedronate Sodium] Other (See Comments)   unknown   Amlodipine Besylate Other (See Comments)   unknown   Aspirin Other (See Comments)   unknown   Atacand [candesartan] Other (See Comments)   unknown   Bextra [valdecoxib] Other (See Comments)   unknown   Bisoprolol-hydrochlorothiazide Other (See Comments)   unknown   Candesartan Cilexetil Other (See Comments)   unknown   Cefadroxil Other (See  Comments)   unknown   Celecoxib Rash   Hydrocodone Other (See Comments)   unknown   Hydrocodone-acetaminophen Other (See Comments)   unknown   Iodinated Diagnostic Agents Rash   "All over" "All over"   Meloxicam Other (See Comments)   unknown   Methylprednisolone Sodium Succinate Other (See Comments)   unknown   Nabumetone Other (See Comments)   unknown   Penicillins Other (See Comments)   unknown   Pseudoephedrine-guaifenesin Other (See Comments)   unknown   Risedronate Sodium Other (See Comments)   unknown   Rofecoxib Other (See Comments)   unknown   Ru-tuss [chlorphen-pse-atrop-hyos-scop] Other (See Comments)   unknown   Sulfonamide Derivatives Other (See Comments)   unknown   Sulphur [sulfur] Other (See Comments)   unknown   Telithromycin Other (See Comments)   unknown   Terfenadine Other (See Comments)   unknown   Trandolapril-verapamil Hcl Er Other (See Comments)   Headache, difficulty urinating, high blood sugar, fluid retention Pt is taking Tarka (trandolapril-verapamil) currently, but requests the medication stay in her allergy list   Trandolapril-verapamil Hcl Er Other (See Comments)   Headache, difficulty urinating, high blood sugar, fluid retention Pt is taking Tarka (trandolapril-verapamil) currently, but requests the medication stay in her allergy list   Valium [diazepam] Other (See Comments)   Makes her mean and hyper      Medication List    STOP taking these medications   ALPRAZolam 0.25 MG tablet Commonly known as: XANAX   amoxicillin-clavulanate 875-125 MG tablet Commonly known as: AUGMENTIN   furosemide 20 MG tablet Commonly known as: LASIX   traMADol 50 MG tablet Commonly known as: ULTRAM     TAKE these medications   Align 4 MG Caps Take 1 capsule by mouth 3 (three) times daily after meals.   BENADRYL ALLERGY PO Take 1 tablet by mouth as needed.   Biotin 1000 MCG tablet Take 1,000 mcg by mouth daily.   Cholecalciferol 125 MCG  (5000 UT) capsule Take 10,000 Units by mouth 2 (two) times a day.   cyanocobalamin 1000 MCG/ML injection Commonly known as: (VITAMIN B-12) INJECT 1 ML INTRAMUSCULARLY EVERY 21 DAYS What changed: See the new instructions.   escitalopram 10 MG tablet Commonly known as: LEXAPRO Take 10 mg by mouth daily.   famotidine 20 MG tablet Commonly known as: PEPCID Take 20 mg by mouth daily.   Florastor 250 MG capsule Generic drug: saccharomyces boulardii Take 250 mg by mouth 2 (two) times daily.   glucose blood test strip Use to check blood sugar 1 time per day.   insulin glargine 100 UNIT/ML injection Commonly known as: LANTUS Inject 14 Units into the skin at bedtime.  insulin lispro 100 UNIT/ML KiwkPen Commonly known as: HumaLOG KwikPen Inject 0.07 mLs (7 Units total) into the skin 3 (three) times daily with meals. And pen needles 1/day   Insulin Pen Needle 32G X 4 MM Misc Used to inject insulin 3x daily   Jardiance 10 MG Tabs tablet Generic drug: empagliflozin Take 10 mg by mouth daily.   levothyroxine 75 MCG tablet Commonly known as: SYNTHROID Take 75 mcg by mouth daily before breakfast. What changed: Another medication with the same name was removed. Continue taking this medication, and follow the directions you see here.   lidocaine 5 % Commonly known as: LIDODERM Place 1 patch onto the skin daily. Remove & Discard patch within 12 hours or as directed by MD What changed:   how much to take  when to take this  reasons to take this   loperamide 2 MG capsule Commonly known as: IMODIUM Take 2 mg by mouth as needed for diarrhea or loose stools.   oxyCODONE-acetaminophen 5-325 MG tablet Commonly known as: PERCOCET/ROXICET Take 1-2 tablets by mouth every 4 (four) hours as needed for severe pain.   phenytoin 100 MG ER capsule Commonly known as: DILANTIN 2 capsules at bedtime and 1 in the morning What changed:   how much to take  how to take this  when to take  this  additional instructions   Phenytoin Infatabs 50 MG tablet Generic drug: phenytoin CHEW 1 TABLET DAILY What changed: See the new instructions.   Polyethyl Glycol-Propyl Glycol 0.4-0.3 % Soln Place 2 drops into both eyes 2 (two) times a day.   primidone 50 MG tablet Commonly known as: MYSOLINE Take 2 tablets by mouth twice daily   Prodigy Voice Blood Glucose w/Device Kit Use to check blood sugar 1 time per day.   trandolapril-verapamil 2-240 MG tablet Commonly known as: TARKA Take 1 tablet by mouth daily.      Allergies  Allergen Reactions   Bystolic [Nebivolol Hcl] Other (See Comments)    "extreme weakness, heaviness in legs & arms, swelling in legs/arms/face, swollen abdomen, pain in bladder, feet pain, soreness in chest"   Cholestyramine     "itching rash on stomach, bloated, nausea, vomiting, sleeplessness, extreme pain in arms"   Hydrazine Yellow [Tartrazine] Other (See Comments)    "does not reduce high blood pressure, pain in arm, high pressure, felt like I was on verge of heart attack, really weak"   Morphine Other (See Comments)    Feels morbid, weak, still in pain   Niacin Palpitations    Fast heart beat   Niaspan [Niacin Er] Palpitations and Other (See Comments)    "fast heart beat, high blood pressure"   Norvasc [Amlodipine Besylate] Other (See Comments)    "extreme fluid retention/pain)   Optivar [Azelastine Hcl] Photosensitivity   Repaglinide Hives   Sular [Nisoldipine Er] Other (See Comments)    "severe headaches, swelling eyes, hands, feet, shortness of breath, weak, flushed face, brain boiling, fluid retention, high blood sugar, nervous, heart fast beating"   Tegretol [Carbamazepine] Other (See Comments)    Blood poisoning    Telmisartan Other (See Comments)    "headache, difficulty urinating, high blood sugar, fluid retention"   Cefdinir Swelling    Vaginal irritation, breathing,    Clonidine Other (See Comments)    Dry mouth,  fluid retention   Clonidine Hydrochloride Other (See Comments)    Dry mouth, fluid retention   Codeine Nausea And Vomiting   Ezetimibe Other (See Comments)  Made weak   Naproxen Other (See Comments)    Shrinks bladder   Ziac [Bisoprolol-Hydrochlorothiazide] Other (See Comments)    "stopped urination"   Elavil [Amitriptyline] Other (See Comments)    Gave Pt nightmares   Hydralazine    Iodine    Keppra [Levetiracetam] Other (See Comments)    Shaking   Lamictal [Lamotrigine]     itching   Lyrica [Pregabalin] Swelling   Pseudoephedrine    Topamax [Topiramate]     Dry eyes   Ace Inhibitors Other (See Comments)    unknown   Actonel [Risedronate Sodium] Other (See Comments)    unknown   Amlodipine Besylate Other (See Comments)    unknown   Aspirin Other (See Comments)    unknown   Atacand [Candesartan] Other (See Comments)    unknown   Bextra [Valdecoxib] Other (See Comments)    unknown   Bisoprolol-Hydrochlorothiazide Other (See Comments)    unknown   Candesartan Cilexetil Other (See Comments)    unknown   Cefadroxil Other (See Comments)    unknown   Celecoxib Rash   Hydrocodone Other (See Comments)    unknown   Hydrocodone-Acetaminophen Other (See Comments)    unknown   Iodinated Diagnostic Agents Rash    "All over" "All over"   Meloxicam Other (See Comments)    unknown   Methylprednisolone Sodium Succinate Other (See Comments)    unknown   Nabumetone Other (See Comments)    unknown   Penicillins Other (See Comments)    unknown   Pseudoephedrine-Guaifenesin Other (See Comments)    unknown   Risedronate Sodium Other (See Comments)    unknown   Rofecoxib Other (See Comments)    unknown   Ru-Tuss [Chlorphen-Pse-Atrop-Hyos-Scop] Other (See Comments)    unknown   Sulfonamide Derivatives Other (See Comments)    unknown   Sulphur [Sulfur] Other (See Comments)    unknown   Telithromycin Other (See Comments)    unknown    Terfenadine Other (See Comments)    unknown   Trandolapril-Verapamil Hcl Er Other (See Comments)    Headache, difficulty urinating, high blood sugar, fluid retention  Pt is taking Tarka (trandolapril-verapamil) currently, but requests the medication stay in her allergy list   Trandolapril-Verapamil Hcl Er Other (See Comments)    Headache, difficulty urinating, high blood sugar, fluid retention  Pt is taking Tarka (trandolapril-verapamil) currently, but requests the medication stay in her allergy list   Valium [Diazepam] Other (See Comments)    Makes her mean and hyper   Follow-up Information    Sydnee Cabal, MD Follow up in 1 week(s).   Specialty: Orthopedic Surgery Why: left patella fracture, repeat knee x ray to ensure the fracture has not displaced Contact information: [address] [place] [address] [phone]        [name], MD Follow up.   Specialty: Family Medicine Contact information: [address] [place] [address] [phone]            The results of significant diagnostics from this hospitalization (including imaging, microbiology, ancillary and laboratory) are listed below for reference.    Significant Diagnostic Studies: Dg Knee 1-2 Views Left  Result Date: 10/26/2018 CLINICAL DATA:  Increased pain and swelling.  Known fracture EXAM: LEFT KNEE - 1-2 VIEW COMPARISON:  10/25/2018 FINDINGS: Transverse fracture in the patella, known. Very large suprapatellar joint effusion that is increased and [place] contain fat. Background of osteopenia and degenerative marginal spurring. No new abnormality.  IMPRESSION: Known nondisplaced patellar fracture with increased joint effusion. Electronically Signed   By: Monte Fantasia M.D.   On: 10/26/2018 10:07   Ct Head Wo Contrast  Result Date: 10/25/2018 CLINICAL DATA:  85 year old and fall. EXAM: CT HEAD WITHOUT CONTRAST CT CERVICAL SPINE WITHOUT CONTRAST TECHNIQUE:  Multidetector CT imaging of the head and cervical spine was performed following the standard protocol without intravenous contrast. Multiplanar CT image reconstructions of the cervical spine were also generated. COMPARISON:  08/19/2015 FINDINGS: CT HEAD FINDINGS Brain: Encephalomalacia in the right frontal lobe appears stable. Negative for acute hemorrhage, mass lesion, midline shift, hydrocephalus or large new infarct. Evidence for an old insult or infarct in the anterior limb of the left internal capsule. Vascular: No hyperdense vessel or unexpected calcification. Skull: Old right frontal craniotomy.  No acute bone abnormality. Sinuses/Orbits: Small amount of fluid in left mastoid air cells. Other: None CT CERVICAL SPINE FINDINGS Alignment: Mild anterolisthesis at C5-C6. Skull base and vertebrae: No acute fracture. No primary bone lesion or focal pathologic process. Soft tissues and spinal canal: No prevertebral fluid or swelling. No visible canal hematoma. Again noted is a large left thyroid nodule measuring at least 2.3 cm. Similar finding was present in 2017. Disc levels: Posterior disc space narrowing at C4-C5. Disc space narrowing at C6-C7. Bilateral facet arthropathy. Upper chest: Negative. Other: None IMPRESSION: 1. No acute intracranial abnormality. 2. Stable encephalomalacia in the right frontal lobe and previous right craniotomy. 3. No acute abnormality in cervical spine. 4. Degenerative changes in the cervical spine. 5. Persistent left thyroid nodule. Electronically Signed   By: Markus Daft M.D.   On: 10/25/2018 20:14   Ct Cervical Spine Wo Contrast  Result Date: 10/25/2018 CLINICAL DATA:  85 year old and fall. EXAM: CT HEAD WITHOUT CONTRAST CT CERVICAL SPINE WITHOUT CONTRAST TECHNIQUE: Multidetector CT imaging of the head and cervical spine was performed following the standard protocol without intravenous contrast. Multiplanar CT image reconstructions of the cervical spine were also generated.  COMPARISON:  08/19/2015 FINDINGS: CT HEAD FINDINGS Brain: Encephalomalacia in the right frontal lobe appears stable. Negative for acute hemorrhage, mass lesion, midline shift, hydrocephalus or large new infarct. Evidence for an old insult or infarct in the anterior limb of the left internal capsule. Vascular: No hyperdense vessel or unexpected calcification. Skull: Old right frontal craniotomy.  No acute bone abnormality. Sinuses/Orbits: Small amount of fluid in left mastoid air cells. Other: None CT CERVICAL SPINE FINDINGS Alignment: Mild anterolisthesis at C5-C6. Skull base and vertebrae: No acute fracture. No primary bone lesion or focal pathologic process. Soft tissues and spinal canal: No prevertebral fluid or swelling. No visible canal hematoma. Again noted is a large left thyroid nodule measuring at least 2.3 cm. Similar finding was present in 2017. Disc levels: Posterior disc space narrowing at C4-C5. Disc space narrowing at C6-C7. Bilateral facet arthropathy. Upper chest: Negative. Other: None IMPRESSION: 1. No acute intracranial abnormality. 2. Stable encephalomalacia in the right frontal lobe and previous right craniotomy. 3. No acute abnormality in cervical spine. 4. Degenerative changes in the cervical spine. 5. Persistent left thyroid nodule. Electronically Signed   By: Markus Daft M.D.   On: 10/25/2018 20:14   Dg Knee Complete 4 Views Left  Result Date: 10/25/2018 CLINICAL DATA:  [place] and injured left knee. EXAM: LEFT KNEE - COMPLETE 4+ VIEW COMPARISON:  04/04/2014 FINDINGS: There is a nondisplaced fracture through the mid pole of the patella with an associated large joint effusion. No femur, tibia or fibula fracture is  identified Tricompartmental degenerative changes noted with joint space narrowing, spurring and chondrocalcinosis. IMPRESSION: 1. Nondisplaced transverse fracture through the mid patella. 2. No femur, tibia or fibular fractures are identified. 3. Large knee joint effusion.  Electronically Signed   By: Marijo Sanes M.D.   On: 10/25/2018 20:17   Dg Hand Complete Right  Result Date: 10/25/2018 CLINICAL DATA:  [place] and injured right hand. EXAM: RIGHT HAND - COMPLETE 3+ VIEW COMPARISON:  Radiographs 03/13/2014 FINDINGS: Moderate degenerative changes involving the DIP and PIP joints of the fingers with findings suggestive of erosive osteoarthritis. Diffuse osteopenia. Remote healed fracture involving the base of the fifth metacarpal. No definite acute hand or wrist fractures are identified. IMPRESSION: No acute fractures. Evidence of remote trauma. Electronically Signed   By: Marijo Sanes M.D.   On: 10/25/2018 20:21   Dg Hip Unilat W Or Wo Pelvis 2-3 Views Left  Result Date: 10/25/2018 CLINICAL DATA:  [place]. EXAM: DG HIP (WITH OR WITHOUT PELVIS) 2-3V LEFT COMPARISON:  None. FINDINGS: Both hips are normally located. No hip fracture or plain film evidence of AVN. The pubic symphysis and SI joints are intact. No pelvic fractures or bone lesions. IMPRESSION: No acute bony findings. Electronically Signed   By: Marijo Sanes M.D.   On: 10/25/2018 20:18    Microbiology: Recent Results (from the past 240 hour(s))  SARS Coronavirus 2 (CEPHEID - Performed in [place] hospital lab), Hosp Order     Status: None   Collection Time: 10/26/18 12:26 AM   Specimen: Nasopharyngeal Swab  Result Value Ref Range Status   SARS Coronavirus 2 NEGATIVE NEGATIVE Final    Comment: (NOTE) If result is NEGATIVE SARS-CoV-2 target nucleic acids are NOT DETECTED. The SARS-CoV-2 RNA is generally detectable in upper and lower  respiratory specimens during the acute phase of infection. The lowest  concentration of SARS-CoV-2 viral copies this assay can detect is 250  copies / mL. A negative result does not preclude SARS-CoV-2 infection  and should not be used as the sole basis for treatment or other  patient management decisions.  A negative result Strine occur with  improper specimen collection /  handling, submission of specimen other  than nasopharyngeal swab, presence of viral mutation(s) within the  areas targeted by this assay, and inadequate number of viral copies  (<250 copies / mL). A negative result must be combined with clinical  observations, patient history, and epidemiological information. If result is POSITIVE SARS-CoV-2 target nucleic acids are DETECTED. The SARS-CoV-2 RNA is generally detectable in upper and lower  respiratory specimens dur ing the acute phase of infection.  Positive  results are indicative of active infection with SARS-CoV-2.  Clinical  correlation with patient history and other diagnostic information is  necessary to determine patient infection status.  Positive results do  not rule out bacterial infection or co-infection with other viruses. If result is PRESUMPTIVE POSTIVE SARS-CoV-2 nucleic acids [place] BE PRESENT.   A presumptive positive result was obtained on the submitted specimen  and confirmed on repeat testing.  While 2019 novel coronavirus  (SARS-CoV-2) nucleic acids [place] be present in the submitted sample  additional confirmatory testing [place] be necessary for epidemiological  and / or clinical management purposes  to differentiate between  SARS-CoV-2 and other Sarbecovirus currently known to infect humans.  If clinically indicated additional testing with an alternate test  methodology [phone]) is advised. The SARS-CoV-2 RNA is generally  detectable in upper and lower respiratory sp ecimens during the acute  phase of infection. The expected result is Negative. Fact Sheet for Patients:  StrictlyIdeas.no Fact Sheet for Healthcare Providers: BankingDealers.co.za This test is not yet approved or cleared by the [place] FDA and has been authorized for detection and/or diagnosis of SARS-CoV-2 by FDA under an Emergency Use Authorization (EUA).  This EUA will remain in effect (meaning this  test can be used) for the duration of the COVID-19 declaration under Section 564(b)(1) of the Act, 21 U.S.C. section 360bbb-3(b)(1), unless the authorization is terminated or revoked sooner. Performed at [facility], [place] [address] [place], [place] [address]      Labs: Basic Metabolic Panel: Recent Labs  Lab 10/26/18 0008 10/26/18 0213 10/27/18 0257 10/28/18 0308 10/29/18 0246  NA 133* 134* 129* 133* 134*  K 3.6 3.7 3.6 3.3* 4.4  CL 100 99 97* 102 106  CO2 '24 25 22 25 22  ' GLUCOSE 237* 226* 193* 169* 226*  BUN '12 11 13 12 12  ' CREATININE 0.63 0.66 0.65 0.60 0.59  CALCIUM 8.5* 8.5* 8.6* 7.9* 7.9*  MG  --   --  1.8 1.7 1.9   Liver Function Tests: Recent Labs  Lab 10/27/18 0257  AST 12*  ALT 16  ALKPHOS 67  BILITOT 0.5  PROT 5.8*  ALBUMIN 3.4*   No results for input(s): LIPASE, AMYLASE in the last 168 hours. No results for input(s): AMMONIA in the last 168 hours. CBC: Recent Labs  Lab 10/26/18 0008 10/26/18 0213 10/27/18 0257 10/28/18 0308 10/29/18 0246  WBC 12.1* 11.2* 11.1* 6.1 5.9  NEUTROABS 10.1*  --  8.7* 3.7 3.7  HGB 12.8 13.0 12.2 10.3* 9.6*  HCT 38.5 39.4 37.4 31.9* 30.3*  MCV 95.8 96.8 97.9 96.7 98.1  PLT 251 227 248 218 231   Cardiac Enzymes: No results for input(s): CKTOTAL, CKMB, CKMBINDEX, TROPONINI in the last 168 hours. BNP: BNP (last 3 results) No results for input(s): BNP in the last 8760 hours.  ProBNP (last 3 results) No results for input(s): PROBNP in the last 8760 hours.  CBG: Recent Labs  Lab 10/28/18 0728 10/28/18 1132 10/28/18 1614 10/28/18 2206 10/29/18 0747  GLUCAP 107* 158* 154* 175* 164*       Signed:  Florencia Reasons MD, PhD  [facility] 10/29/2018, 10:22 AM

## 2018-10-29 NOTE — TOC Transition Note (Signed)
Transition of Care [facility]) - CM/SW Discharge Note   Patient Details  Name: [name] MRN: [id number] Date of Birth: [birth year]  Transition of Care [facility]) CM/SW Contact:  Claudine Mouton, LCSW Phone Number: 10/29/2018, 2:54 PM   Clinical Narrative:   CSW received a call from [name] at [facility] stating the patient has been offered a bed and has been accepted and that the pt can arrive on 10/29/2018.  The pt's accepting doctor is the SNF MD.  The room number will be 102-P.  The number for report is [phone].  RN updated.  CSW wil place pt's D/C packet in pt's chart.  RN aware.  CSW called pt's niece [name],[name] at [phone] and updated her and provided the pt's niece with the admission's person.   Final next level of care: Skilled Nursing Facility Barriers to Discharge: No Barriers Identified   Patient Goals and CMS Choice        Discharge Placement              Patient chooses bed at: [facility] SNF) Patient to be transferred to facility by: [name]) Name of family member notified: [name],[name] at [phone] Patient and family notified of of transfer: 10/29/18  Discharge Plan and Services                                     Social Determinants of Health ([place]) Interventions     Readmission Risk Interventions No flowsheet data found.

## 2018-10-29 NOTE — Progress Notes (Addendum)
CSW received a call from [place] at [facility] stating the patient has been offered a bed and has been accepted and that the pt can arrive on 10/29/2018.  The pt's accepting doctor is the SNF MD.  The room number will be 102-P.  The number for report is [phone].  RN updated.  CSW wil place pt's D/C packet in pt's chart.  RN aware.  1:28 PM [facility] called. RN aware.  CSW will update RN and EDP.  Alphonse Guild. Symir Mah, Latanya Presser, LCAS Clinical Social Worker Ph: [phone]

## 2018-10-31 ENCOUNTER — Other Ambulatory Visit: Payer: Self-pay | Admitting: *Deleted

## 2018-10-31 NOTE — Patient Outreach (Signed)
[facility]) Care Management  10/31/2018  [name] Perfect October 25, 1929 [id number]   RN Health CoachCase Closure  Referral Date:09/06/2017 Referral Source:Transfer from [facility] Nurse Reason for Referral:Continued Disease Management Education Insurance:Medicare   Outreach Attempt:  Patient discharged to [facility] from [facility] on 10/29/2018.  Plan:  RN Health Coach will close case at this time and patient is now at [facility].  RN Health Coach will send primary care provider Case Closure letter.  RN Health Coach will make patient inactive with [facility] at this time.  [place] [phone] [email or web address]

## 2018-11-01 DIAGNOSIS — S82002A Unspecified fracture of left patella, initial encounter for closed fracture: Secondary | ICD-10-CM | POA: Diagnosis not present

## 2018-11-01 DIAGNOSIS — E118 Type 2 diabetes mellitus with unspecified complications: Secondary | ICD-10-CM | POA: Diagnosis not present

## 2018-11-01 DIAGNOSIS — G40909 Epilepsy, unspecified, not intractable, without status epilepticus: Secondary | ICD-10-CM | POA: Diagnosis not present

## 2018-11-01 DIAGNOSIS — I10 Essential (primary) hypertension: Secondary | ICD-10-CM | POA: Diagnosis not present

## 2018-11-02 ENCOUNTER — Ambulatory Visit: Payer: Medicare Other | Admitting: *Deleted

## 2018-11-03 ENCOUNTER — Encounter: Payer: Self-pay | Admitting: Podiatry

## 2018-11-03 NOTE — Progress Notes (Signed)
Subjective: [name] presents today with history of diabetic neuropathy. Patient seen for follow up of chronic, painful mycotic toenails and corns and calluses which interfere with daily activities and routine tasks.  Pain is aggravated when wearing enclosed shoe gear. Pain is getting progressively worse and relieved with periodic professional debridement.   Yolanda Bien, MD is her PCP.  Current Outpatient Medications:  .  Biotin 1000 MCG tablet, Take 1,000 mcg by mouth daily. , Disp: , Rfl:  .  Blood Glucose Monitoring Suppl (PRODIGY VOICE BLOOD GLUCOSE) w/Device KIT, Use to check blood sugar 1 time per day., Disp: 1 each, Rfl: 2 .  Cholecalciferol 5000 units capsule, Take 10,000 Units by mouth 2 (two) times a day. , Disp: , Rfl:  .  cyanocobalamin (,VITAMIN B-12,) 1000 MCG/ML injection, INJECT 1 ML INTRAMUSCULARLY EVERY 21 DAYS (Patient taking differently: Inject 1,000 mcg into the muscle every 21 ( twenty-one) days. ), Disp: 3 mL, Rfl: 2 .  DiphenhydrAMINE HCl (BENADRYL ALLERGY PO), Take 1 tablet by mouth as needed., Disp: , Rfl:  .  empagliflozin (JARDIANCE) 10 MG TABS tablet, Take 10 mg by mouth daily., Disp: , Rfl:  .  escitalopram (LEXAPRO) 10 MG tablet, Take 10 mg by mouth daily., Disp: , Rfl:  .  famotidine (PEPCID) 20 MG tablet, Take 20 mg by mouth daily. , Disp: , Rfl:  .  glucose blood test strip, Use to check blood sugar 1 time per day., Disp: 100 each, Rfl: 3 .  insulin glargine (LANTUS) 100 UNIT/ML injection, Inject 14 Units into the skin at bedtime. , Disp: , Rfl:  .  insulin lispro (HUMALOG KWIKPEN) 100 UNIT/ML KiwkPen, Inject 0.07 mLs (7 Units total) into the skin 3 (three) times daily with meals. And pen needles 1/day (Patient not taking: Reported on 09/06/2018), Disp: 15 pen, Rfl: 3 .  Insulin Pen Needle 32G X 4 MM MISC, Used to inject insulin 3x daily, Disp: 270 each, Rfl: 2 .  levothyroxine (SYNTHROID) 75 MCG tablet, Take 75 mcg by mouth daily before breakfast., Disp: ,  Rfl:  .  lidocaine (LIDODERM) 5 %, Place 1 patch onto the skin daily. Remove & Discard patch within 12 hours or as directed by MD (Patient taking differently: Place 1-2 patches onto the skin daily as needed (pain). Remove & Discard patch within 12 hours or as directed by MD), Disp: 90 patch, Rfl: 1 .  loperamide (IMODIUM) 2 MG capsule, Take 2 mg by mouth as needed for diarrhea or loose stools., Disp: , Rfl:  .  oxyCODONE-acetaminophen (PERCOCET/ROXICET) 5-325 MG tablet, Take 1-2 tablets by mouth every 4 (four) hours as needed for severe pain., Disp: 15 tablet, Rfl: 0 .  phenytoin (DILANTIN) 100 MG ER capsule, 2 capsules at bedtime and 1 in the morning (Patient taking differently: Take 1-2 mg by mouth 2 (two) times daily. Take 1 capsule (100 mg) in the morning and Take 2 capsules (200 mg) at bedtime), Disp: 270 capsule, Rfl: 2 .  PHENYTOIN INFATABS 50 MG tablet, CHEW 1 TABLET DAILY (Patient taking differently: Chew 50 mg by mouth daily. Take along with 100 mg tablet in the morning), Disp: 90 tablet, Rfl: 3 .  Polyethyl Glycol-Propyl Glycol 0.4-0.3 % SOLN, Place 2 drops into both eyes 2 (two) times a day. , Disp: , Rfl:  .  primidone (MYSOLINE) 50 MG tablet, Take 2 tablets by mouth twice daily, Disp: 360 tablet, Rfl: 1 .  Probiotic Product (ALIGN) 4 MG CAPS, Take 1 capsule  by mouth 3 (three) times daily after meals. , Disp: , Rfl:  .  saccharomyces boulardii (FLORASTOR) 250 MG capsule, Take 250 mg by mouth 2 (two) times daily., Disp: , Rfl:  .  trandolapril-verapamil (TARKA) 2-240 MG tablet, Take 1 tablet by mouth daily., Disp: , Rfl:   Allergies  Allergen Reactions  . Bystolic [Nebivolol Hcl] Other (See Comments)    "extreme weakness, heaviness in legs & arms, swelling in legs/arms/face, swollen abdomen, pain in bladder, feet pain, soreness in chest"  . Cholestyramine     "itching rash on stomach, bloated, nausea, vomiting, sleeplessness, extreme pain in arms"  . Hydrazine Yellow [Tartrazine] Other  (See Comments)    "does not reduce high blood pressure, pain in arm, high pressure, felt like I was on verge of heart attack, really weak"  . Morphine Other (See Comments)    Feels morbid, weak, still in pain  . Niacin Palpitations    Fast heart beat  . Niaspan [Niacin Er] Palpitations and Other (See Comments)    "fast heart beat, high blood pressure"  . Norvasc [Amlodipine Besylate] Other (See Comments)    "extreme fluid retention/pain)  . Optivar [Azelastine Hcl] Photosensitivity  . Repaglinide Hives  . Sular [Nisoldipine Er] Other (See Comments)    "severe headaches, swelling eyes, hands, feet, shortness of breath, weak, flushed face, brain boiling, fluid retention, high blood sugar, nervous, heart fast beating"  . Tegretol [Carbamazepine] Other (See Comments)    Blood poisoning   . Telmisartan Other (See Comments)    "headache, difficulty urinating, high blood sugar, fluid retention"  . Cefdinir Swelling    Vaginal irritation, breathing,   . Clonidine Other (See Comments)    Dry mouth, fluid retention  . Clonidine Hydrochloride Other (See Comments)    Dry mouth, fluid retention  . Codeine Nausea And Vomiting  . Ezetimibe Other (See Comments)    Made weak  . Naproxen Other (See Comments)    Shrinks bladder  . Ziac [Bisoprolol-Hydrochlorothiazide] Other (See Comments)    "stopped urination"  . Elavil [Amitriptyline] Other (See Comments)    Gave Pt nightmares  . Hydralazine   . Iodine   . Keppra [Levetiracetam] Other (See Comments)    Shaking  . Lamictal [Lamotrigine]     itching  . Lyrica [Pregabalin] Swelling  . Pseudoephedrine   . Topamax [Topiramate]     Dry eyes  . Ace Inhibitors Other (See Comments)    unknown  . Actonel [Risedronate Sodium] Other (See Comments)    unknown  . Amlodipine Besylate Other (See Comments)    unknown  . Aspirin Other (See Comments)    unknown  . Atacand [Candesartan] Other (See Comments)    unknown  . Bextra [Valdecoxib] Other  (See Comments)    unknown  . Bisoprolol-Hydrochlorothiazide Other (See Comments)    unknown  . Candesartan Cilexetil Other (See Comments)    unknown  . Cefadroxil Other (See Comments)    unknown  . Celecoxib Rash  . Hydrocodone Other (See Comments)    unknown  . Hydrocodone-Acetaminophen Other (See Comments)    unknown  . Iodinated Diagnostic Agents Rash    "All over" "All over"  . Meloxicam Other (See Comments)    unknown  . Methylprednisolone Sodium Succinate Other (See Comments)    unknown  . Nabumetone Other (See Comments)    unknown  . Penicillins Other (See Comments)    unknown  . Pseudoephedrine-Guaifenesin Other (See Comments)    unknown  . Risedronate  Sodium Other (See Comments)    unknown  . Rofecoxib Other (See Comments)    unknown  . Ru-Tuss [Chlorphen-Pse-Atrop-Hyos-Scop] Other (See Comments)    unknown  . Sulfonamide Derivatives Other (See Comments)    unknown  . Sulphur [Sulfur] Other (See Comments)    unknown  . Telithromycin Other (See Comments)    unknown  . Terfenadine Other (See Comments)    unknown  . Trandolapril-Verapamil Hcl Er Other (See Comments)    Headache, difficulty urinating, high blood sugar, fluid retention  Pt is taking Tarka (trandolapril-verapamil) currently, but requests the medication stay in her allergy list  . Trandolapril-Verapamil Hcl Er Other (See Comments)    Headache, difficulty urinating, high blood sugar, fluid retention  Pt is taking Tarka (trandolapril-verapamil) currently, but requests the medication stay in her allergy list  . Valium [Diazepam] Other (See Comments)    Makes her mean and hyper    Objective: Vitals:   10/25/18 1126  Temp: 97.9 F (36.6 C)    Vascular Examination: Capillary refill time <3 seconds x 10 digits.  Dorsalis pedis pulses 1/4 b/l.  Posterior tibial pulses present 1/4 b/l.  No digital hair x 10 digits.  Skin temperature WNL b/l.  Dermatological Examination: Skin with normal  turgor, texture and tone b/l.  Toenails 1-5 b/l discolored, thick, dystrophic with subungual debris and pain with palpation to nailbeds due to thickness of nails.  Hyperkeratotic lesion(s) distal tip right 3rd digit. Porokeratotic lesions submet head 1 b/lNo erythema, no edema, no drainage, no flocculence noted.   She has a blister noted on the right 2nd digit. No erythema, no edema. No signs of infection.  Musculoskeletal: Muscle strength 5/5 to all LE muscle groups.  Hammertoe deformity 2-5 b/l.  Neurological: Sensation diminished with 10 gram monofilament.  Assessment: 1. Painful onychomycosis toenails 1-5 b/l 2. Corn right 3rd digit 3. Calluses submet head 1 b/l 4. NIDDM with neuropathy   Plan: 1. Toenails 1-5 b/l were debrided in length and girth without iatrogenic bleeding. 2. Calluses pared submetatarsal head(s) utilizing sterile scalpel blade without incident. Corn(s) pared right 3rd digit utilizing sterile scalpel blade without incident.  3. Sterile prep applied to right 3rd digit and blister lanced. Applied antibiotic ointment and light dressing. Patient instructed to apply triple antibiotic ointment once daily until healed.Call office if there are any problems. 4. Patient to continue soft, supportive shoe gear daily. 5. Patient to report any pedal injuries to medical professional immediately. 6. Follow up 3 months.  7. Patient/POA to call should there be a concern in the interim.

## 2018-11-15 DIAGNOSIS — S82002D Unspecified fracture of left patella, subsequent encounter for closed fracture with routine healing: Secondary | ICD-10-CM | POA: Diagnosis not present

## 2018-11-15 DIAGNOSIS — E1165 Type 2 diabetes mellitus with hyperglycemia: Secondary | ICD-10-CM | POA: Diagnosis not present

## 2018-11-15 DIAGNOSIS — K580 Irritable bowel syndrome with diarrhea: Secondary | ICD-10-CM | POA: Diagnosis not present

## 2018-11-17 ENCOUNTER — Other Ambulatory Visit: Payer: Self-pay | Admitting: *Deleted

## 2018-11-17 NOTE — Patient Outreach (Signed)
Member assessed for potential [name] Care Management needs as a benefit of [place] [employer].  [name] remains at [facility] receiving rehab therapy.  Spoke with [name] UM RN after her telephonic IDT meeting with [name] staff.   Disposition plan is for member to return home post SNF. She lives alone with supportive sister and niece.  Discussed that writer will plan to make referral to [name] upon SNF discharge since member was active with [name] prior to admission.  Will continue to collaborate with [facility] UM RN on member.    Yolanda Rolling, MSN-Ed, RN,BSN [place] Acute Care Coordinator [phone] [name]) [phone]  (Toll free office)

## 2018-11-18 DIAGNOSIS — R258 Other abnormal involuntary movements: Secondary | ICD-10-CM | POA: Diagnosis not present

## 2018-11-18 DIAGNOSIS — F418 Other specified anxiety disorders: Secondary | ICD-10-CM | POA: Diagnosis not present

## 2018-11-22 DIAGNOSIS — D6489 Other specified anemias: Secondary | ICD-10-CM | POA: Diagnosis not present

## 2018-11-22 DIAGNOSIS — G4089 Other seizures: Secondary | ICD-10-CM | POA: Diagnosis not present

## 2018-11-22 DIAGNOSIS — E871 Hypo-osmolality and hyponatremia: Secondary | ICD-10-CM | POA: Diagnosis not present

## 2018-11-22 DIAGNOSIS — E118 Type 2 diabetes mellitus with unspecified complications: Secondary | ICD-10-CM | POA: Diagnosis not present

## 2018-11-22 DIAGNOSIS — I10 Essential (primary) hypertension: Secondary | ICD-10-CM | POA: Diagnosis not present

## 2018-11-22 DIAGNOSIS — S82002D Unspecified fracture of left patella, subsequent encounter for closed fracture with routine healing: Secondary | ICD-10-CM | POA: Diagnosis not present

## 2018-11-24 DIAGNOSIS — E038 Other specified hypothyroidism: Secondary | ICD-10-CM | POA: Diagnosis not present

## 2018-11-24 DIAGNOSIS — S82002A Unspecified fracture of left patella, initial encounter for closed fracture: Secondary | ICD-10-CM | POA: Diagnosis not present

## 2018-11-24 DIAGNOSIS — R2689 Other abnormalities of gait and mobility: Secondary | ICD-10-CM | POA: Diagnosis not present

## 2018-11-24 DIAGNOSIS — E871 Hypo-osmolality and hyponatremia: Secondary | ICD-10-CM | POA: Diagnosis not present

## 2018-11-24 DIAGNOSIS — I10 Essential (primary) hypertension: Secondary | ICD-10-CM | POA: Diagnosis not present

## 2018-11-24 DIAGNOSIS — K580 Irritable bowel syndrome with diarrhea: Secondary | ICD-10-CM | POA: Diagnosis not present

## 2018-11-24 DIAGNOSIS — E118 Type 2 diabetes mellitus with unspecified complications: Secondary | ICD-10-CM | POA: Diagnosis not present

## 2018-11-27 DIAGNOSIS — G8929 Other chronic pain: Secondary | ICD-10-CM | POA: Diagnosis not present

## 2018-11-27 DIAGNOSIS — M810 Age-related osteoporosis without current pathological fracture: Secondary | ICD-10-CM | POA: Diagnosis not present

## 2018-11-27 DIAGNOSIS — D649 Anemia, unspecified: Secondary | ICD-10-CM | POA: Diagnosis not present

## 2018-11-27 DIAGNOSIS — K529 Noninfective gastroenteritis and colitis, unspecified: Secondary | ICD-10-CM | POA: Diagnosis not present

## 2018-11-27 DIAGNOSIS — S82035D Nondisplaced transverse fracture of left patella, subsequent encounter for closed fracture with routine healing: Secondary | ICD-10-CM | POA: Diagnosis not present

## 2018-11-27 DIAGNOSIS — E1165 Type 2 diabetes mellitus with hyperglycemia: Secondary | ICD-10-CM | POA: Diagnosis not present

## 2018-11-27 DIAGNOSIS — Z79899 Other long term (current) drug therapy: Secondary | ICD-10-CM | POA: Diagnosis not present

## 2018-11-27 DIAGNOSIS — I10 Essential (primary) hypertension: Secondary | ICD-10-CM | POA: Diagnosis not present

## 2018-11-27 DIAGNOSIS — M545 Low back pain: Secondary | ICD-10-CM | POA: Diagnosis not present

## 2018-11-27 DIAGNOSIS — G250 Essential tremor: Secondary | ICD-10-CM | POA: Diagnosis not present

## 2018-11-27 DIAGNOSIS — W19XXXD Unspecified fall, subsequent encounter: Secondary | ICD-10-CM | POA: Diagnosis not present

## 2018-11-27 DIAGNOSIS — E039 Hypothyroidism, unspecified: Secondary | ICD-10-CM | POA: Diagnosis not present

## 2018-11-27 DIAGNOSIS — M25572 Pain in left ankle and joints of left foot: Secondary | ICD-10-CM | POA: Diagnosis not present

## 2018-11-27 DIAGNOSIS — Z9181 History of falling: Secondary | ICD-10-CM | POA: Diagnosis not present

## 2018-11-27 DIAGNOSIS — Z794 Long term (current) use of insulin: Secondary | ICD-10-CM | POA: Diagnosis not present

## 2018-11-27 DIAGNOSIS — K219 Gastro-esophageal reflux disease without esophagitis: Secondary | ICD-10-CM | POA: Diagnosis not present

## 2018-11-27 DIAGNOSIS — E785 Hyperlipidemia, unspecified: Secondary | ICD-10-CM | POA: Diagnosis not present

## 2018-11-27 DIAGNOSIS — G40909 Epilepsy, unspecified, not intractable, without status epilepticus: Secondary | ICD-10-CM | POA: Diagnosis not present

## 2018-11-27 DIAGNOSIS — E8881 Metabolic syndrome: Secondary | ICD-10-CM | POA: Diagnosis not present

## 2018-11-27 DIAGNOSIS — M1712 Unilateral primary osteoarthritis, left knee: Secondary | ICD-10-CM | POA: Diagnosis not present

## 2018-11-27 DIAGNOSIS — Z8781 Personal history of (healed) traumatic fracture: Secondary | ICD-10-CM | POA: Diagnosis not present

## 2018-11-28 ENCOUNTER — Other Ambulatory Visit: Payer: Self-pay | Admitting: *Deleted

## 2018-11-28 DIAGNOSIS — S82015D Nondisplaced osteochondral fracture of left patella, subsequent encounter for closed fracture with routine healing: Secondary | ICD-10-CM | POA: Diagnosis not present

## 2018-11-28 DIAGNOSIS — M25572 Pain in left ankle and joints of left foot: Secondary | ICD-10-CM | POA: Diagnosis not present

## 2018-11-28 DIAGNOSIS — M1712 Unilateral primary osteoarthritis, left knee: Secondary | ICD-10-CM | POA: Diagnosis not present

## 2018-11-28 DIAGNOSIS — I10 Essential (primary) hypertension: Secondary | ICD-10-CM

## 2018-11-28 NOTE — Patient Outreach (Signed)
[facility]) Care Management  11/28/2018  [name] August 27, 1929 [id number]    Referral received 11/28/2018 Initial Outreach 11/28/2018  RN attempted outreach call to pt today however unsuccessful. RN able to leave a HIPAA approved voice message requesting a call back.  Will rescheduled another follow up call within 4 business days for pending services.  Raina Mina, RN Care Management Coordinator [place] Office [phone]

## 2018-11-28 NOTE — Patient Outreach (Addendum)
Member assessed for potential [facility] Care Management needs as a benefit of [place] Medicare.  Telephone call made to Mrs. Borowiak at [phone]. Patient identifiers confirmed. Mrs. Smotherman confirms that she discharged from [facility] over this past weekend. She is agreeable to [place] follow up telephonically. Discussed that she was previously active with [facility].  Mrs. Kamiya lives alone. She confirms she has caregivers Monday thru Friday from 9 am to 3 pm. Her sister stays with her over the weekends. She reports she is covered for meals.   Explained [facility] Care Management will not interfere or replace home health. Also discussed that follow up will be telephonic due to the current pandemic. Mrs. Pyon expressed understanding.  Mrs. Dewberry has a medical history of DM, HLD, GERD, HTN. Prior to recent admission, she sustained a mechanic fall at home that resulted in closed nondisplaced fractured patella (no surgery).  Mrs. Allcock reports she has her ortho appointment today at 2 pm. States she is supposed to have her televisit appointment with her PCP on tomorrow 11/29/18.   Will make referral to [facility] for complex case management and DM.    Marthenia Rolling, MSN-Ed, RN,BSN [place] Acute Care Coordinator [phone] [facility]) [phone]  (Toll free office)

## 2018-11-29 DIAGNOSIS — M545 Low back pain: Secondary | ICD-10-CM | POA: Diagnosis not present

## 2018-11-29 DIAGNOSIS — E119 Type 2 diabetes mellitus without complications: Secondary | ICD-10-CM | POA: Diagnosis not present

## 2018-11-29 DIAGNOSIS — M1712 Unilateral primary osteoarthritis, left knee: Secondary | ICD-10-CM | POA: Diagnosis not present

## 2018-11-29 DIAGNOSIS — M25572 Pain in left ankle and joints of left foot: Secondary | ICD-10-CM | POA: Diagnosis not present

## 2018-11-29 DIAGNOSIS — E1165 Type 2 diabetes mellitus with hyperglycemia: Secondary | ICD-10-CM | POA: Diagnosis not present

## 2018-11-29 DIAGNOSIS — S93402D Sprain of unspecified ligament of left ankle, subsequent encounter: Secondary | ICD-10-CM | POA: Diagnosis not present

## 2018-11-29 DIAGNOSIS — S82001S Unspecified fracture of right patella, sequela: Secondary | ICD-10-CM | POA: Diagnosis not present

## 2018-11-29 DIAGNOSIS — I10 Essential (primary) hypertension: Secondary | ICD-10-CM | POA: Diagnosis not present

## 2018-11-29 DIAGNOSIS — S82035D Nondisplaced transverse fracture of left patella, subsequent encounter for closed fracture with routine healing: Secondary | ICD-10-CM | POA: Diagnosis not present

## 2018-11-30 DIAGNOSIS — I10 Essential (primary) hypertension: Secondary | ICD-10-CM | POA: Diagnosis not present

## 2018-11-30 DIAGNOSIS — E1165 Type 2 diabetes mellitus with hyperglycemia: Secondary | ICD-10-CM | POA: Diagnosis not present

## 2018-11-30 DIAGNOSIS — M25572 Pain in left ankle and joints of left foot: Secondary | ICD-10-CM | POA: Diagnosis not present

## 2018-11-30 DIAGNOSIS — S82035D Nondisplaced transverse fracture of left patella, subsequent encounter for closed fracture with routine healing: Secondary | ICD-10-CM | POA: Diagnosis not present

## 2018-11-30 DIAGNOSIS — M1712 Unilateral primary osteoarthritis, left knee: Secondary | ICD-10-CM | POA: Diagnosis not present

## 2018-11-30 DIAGNOSIS — M545 Low back pain: Secondary | ICD-10-CM | POA: Diagnosis not present

## 2018-12-01 ENCOUNTER — Other Ambulatory Visit: Payer: Self-pay | Admitting: *Deleted

## 2018-12-01 ENCOUNTER — Encounter: Payer: Self-pay | Admitting: *Deleted

## 2018-12-01 NOTE — Patient Outreach (Signed)
[facility]) Care Management  12/01/2018  [name] 1929/12/25 [id number]    Referral received 11/28/2018 2nd outreach 12/01/2018 via SNF discharge  Transition of care  RN spoke with pt today and explained [facility] services and the purpose for today's call. Inquired if this was a good time to talk further concerning her management of care (pt receptive). Further discussed on her recent stay at the SNF and reason for admission related a fall resulting in injuries.   FALLS/SAFETY-Pt states she currently has a soft cass and immobilizer in place. Currently pending [facility] for PT/OT however has supportive help in the home most of the time. Pt has a in-home aide, sister and niece between the three in the home at all times. Pt also verified she has a emergency alert system in with her ADT system for both inside and outside in the yard. RN inquired further on use of DME in the home and verified the use. Pt states she is able to get up and move around some if needed but usually always has assistance. Reports use of a walker, cane and again awaiting [facility] to start with PT services. Pt has verified several falls over the year most with injuries but will definitely have more help to assistance to reduce her risk of falls. RN strongly encouraged pt to use her DME at all times to avoid ongoing injuries and take advantage of the help in the home at all times to again reduce the risk of falls. Pt indicates she is able to make it to the bedroom prior to elimination with no problems. RN continue to encouraged pt to use her 3-1 if needed at the bedside until she has more strength.  DIABETES-Pt states this is under control with her AM reading at 135 and a recent reduction in her insulin only taking Lantus. Pt documenting all her readings for provider to view as this was a recent change in her medications. Pt is aware of hyper-hypoglycemia and what to do if readings are acute.  HTN-Pt continue to states  this is a condition that she monitors daily with a home BP today at 135/80 denying any precipitating symptoms.   Pt is able to verify all her medications and RN offered further education on some of her existing medications. Verified pt has a sufficient supply with no needed refills.   RN offered ongoing follow up calls for transition of care with focus on a plan of care related to safety/fall prevention due to her most recent SNF discharged related injuries from a fall. Pt receptive and agree to RN's week calls over the next month followed by month contacts as she continues to recovery. RN also informed pt that Dr. Ernie Hew would be updated on her disposition with [facility] services. Again pt very apreciative and grateful for the call today. The follow up care plan was generated and discussed in detail along with the goals and interventions on how to obtain these goals. No other inquires or request at this time as RN will follow up next week with another transition of care contact.  [facility] CM Care Plan Problem One     Most Recent Value  Care Plan Problem One  Impaired safety-Falls related to balance and difficulty with gait  Role Documenting the Problem One  Care Management [place] for Problem One  Active  [facility] Long Term Goal   Pt will demonstrate use of safety measures to prevent falls within the next 90 days.  [facility] Long Term Goal Start Date  12/01/18  Interventions for Problem One Long Term Goal  Will discuss recent fall leading to rehab and strongly encouraged pt to use her DME when ambulating and standing.  Will stress the importance of work with PT/OT [facility] services to prevent furture falls and ongoing use of safety measures both inside and outside the home.  [facility] CM Short Term Goal #1   Adherence with post-op SNF medications within the next 30 days.  [facility] CM Short Term Goal #1 Start Date  12/01/18  Interventions for Short Term Goal #1  Will discuss the importanc eof taking all prescribed  medications to avoid acute events or issues from occuring. Will review and educate on all prescribed medications via discharge medications from SNF. Will re-evaluate this goal accordingly.  [facility] CM Short Term Goal #2   Adherence with attending all scheduled medical appointments within the next 30 days.  [facility] CM Short Term Goal #2 Start Date  12/01/18  Interventions for Short Term Goal #2  Will strongly encouraged pt to attend all medical appointment to avoid NO SHOW fees and for ongoing management of care related to his medical issues. Will verified pt has sufficient transportation sources and offer another resources if needed for ongoing attendence.      Raina Mina, RN Care Management Coordinator [place] Office [phone]

## 2018-12-02 DIAGNOSIS — M545 Low back pain: Secondary | ICD-10-CM | POA: Diagnosis not present

## 2018-12-02 DIAGNOSIS — S82035D Nondisplaced transverse fracture of left patella, subsequent encounter for closed fracture with routine healing: Secondary | ICD-10-CM | POA: Diagnosis not present

## 2018-12-02 DIAGNOSIS — M25572 Pain in left ankle and joints of left foot: Secondary | ICD-10-CM | POA: Diagnosis not present

## 2018-12-02 DIAGNOSIS — E1165 Type 2 diabetes mellitus with hyperglycemia: Secondary | ICD-10-CM | POA: Diagnosis not present

## 2018-12-02 DIAGNOSIS — M1712 Unilateral primary osteoarthritis, left knee: Secondary | ICD-10-CM | POA: Diagnosis not present

## 2018-12-02 DIAGNOSIS — I10 Essential (primary) hypertension: Secondary | ICD-10-CM | POA: Diagnosis not present

## 2018-12-06 DIAGNOSIS — E1165 Type 2 diabetes mellitus with hyperglycemia: Secondary | ICD-10-CM | POA: Diagnosis not present

## 2018-12-06 DIAGNOSIS — I10 Essential (primary) hypertension: Secondary | ICD-10-CM | POA: Diagnosis not present

## 2018-12-06 DIAGNOSIS — S82035D Nondisplaced transverse fracture of left patella, subsequent encounter for closed fracture with routine healing: Secondary | ICD-10-CM | POA: Diagnosis not present

## 2018-12-06 DIAGNOSIS — M25572 Pain in left ankle and joints of left foot: Secondary | ICD-10-CM | POA: Diagnosis not present

## 2018-12-06 DIAGNOSIS — M545 Low back pain: Secondary | ICD-10-CM | POA: Diagnosis not present

## 2018-12-06 DIAGNOSIS — M1712 Unilateral primary osteoarthritis, left knee: Secondary | ICD-10-CM | POA: Diagnosis not present

## 2018-12-07 ENCOUNTER — Other Ambulatory Visit: Payer: Self-pay | Admitting: *Deleted

## 2018-12-07 NOTE — Patient Outreach (Signed)
[facility]) Care Management  12/07/2018  [name] 02/16/1930 [id number]   Transition of care  RN spoke with pt today and received an update on her ongoing management of care. Pt reports ongoing services with the involved [place] agency with PT/OT participating. States she will have another follow up appointments with her orthopedic provider on Monday. Pt verified she continues to rear her soft cass and immobilizer as recommended. Pt having some swelling today indicating she did not elevate her leg as much on yesterday but her therapist worked with her and informed her to elevate more today to reduce the ongoing swelling. Pt denies any throbbing or pain at this time.   RN reviewed the current plan of care as pt verified ongoing supported from family and the hired aide to assist with needed. Verified adherence with medications and medical appointments. Along with use of equipment to prevent falls or injuries. Pt verified no falls or related incidents and she continues once again that she is working with her therapists. No needed community resources needed at this time as pt continue to progress through the plan of care without difficult.   Plan to follow up next week on the goals of care and update plan accordingly.   THN CM Care Plan Problem One     Most Recent Value  Care Plan Problem One  Impaired safety-Falls related to balance and difficulty with gait  Role Documenting the Problem One  Care Management [place] for Problem One  Active  THN Long Term Goal   Pt will demonstrate use of safety measures to prevent falls within the next 90 days.  THN Long Term Goal Start Date  12/01/18  Interventions for Problem One Long Term Goal  Will verify pt continues to work with PT/OT with [facility] services on exercises for strenghtening and endurance. Will continue to encouraged adherence and verify no falls or injuirs have occurred over the last weeks. Will encouraged pt to  use her assisted devices as recommended to prevent falls and/or injuries as she continues to recovery. Pt continues to verify soft cass and immobilizer remain in place.   THN CM Short Term Goal #1   Adherence with post-op SNF medications within the next 30 days.  THN CM Short Term Goal #1 Start Date  12/01/18  Interventions for Short Term Goal #1  Will verify pt has all medications and administering without delay. Will extend and verify adherence with this goal over the next few weeks. No problems or issues mentioned on this assessment.   THN CM Short Term Goal #2   Adherence with attending all scheduled medical appointments within the next 30 days.  THN CM Short Term Goal #2 Start Date  12/01/18  Interventions for Short Term Goal #2  Verified all pending appointments and verify pt has sufficient transportation to visit providers if requested. Will extend and re-evaluate this goal allow adherence for pt to comply with the scheduled appointments.      Raina Mina, RN Care Management Coordinator [place] Office [phone]

## 2018-12-08 DIAGNOSIS — M25572 Pain in left ankle and joints of left foot: Secondary | ICD-10-CM | POA: Diagnosis not present

## 2018-12-08 DIAGNOSIS — E1165 Type 2 diabetes mellitus with hyperglycemia: Secondary | ICD-10-CM | POA: Diagnosis not present

## 2018-12-08 DIAGNOSIS — M545 Low back pain: Secondary | ICD-10-CM | POA: Diagnosis not present

## 2018-12-08 DIAGNOSIS — M1712 Unilateral primary osteoarthritis, left knee: Secondary | ICD-10-CM | POA: Diagnosis not present

## 2018-12-08 DIAGNOSIS — S82035D Nondisplaced transverse fracture of left patella, subsequent encounter for closed fracture with routine healing: Secondary | ICD-10-CM | POA: Diagnosis not present

## 2018-12-08 DIAGNOSIS — I10 Essential (primary) hypertension: Secondary | ICD-10-CM | POA: Diagnosis not present

## 2018-12-12 DIAGNOSIS — S82002D Unspecified fracture of left patella, subsequent encounter for closed fracture with routine healing: Secondary | ICD-10-CM | POA: Diagnosis not present

## 2018-12-12 DIAGNOSIS — M25562 Pain in left knee: Secondary | ICD-10-CM | POA: Diagnosis not present

## 2018-12-15 ENCOUNTER — Other Ambulatory Visit: Payer: Self-pay | Admitting: *Deleted

## 2018-12-15 NOTE — Patient Outreach (Signed)
[facility]) Care Management  12/15/2018  Eleah Lahaie Grosvenor 12-24-1929 [id number]    Transition of care-Prevention Measures (falls)  RN spoke with pt today who provided an update ongoing her ongoing management of care. Pt reports she continues to have support from her sister, niece and hired aide services as a precaution while living alone. Pt reports her recent follow up appointments with her provider and a change in her Jerlyn Ly and removal of the immobilizer to allow more movement. Pt states her provider has recommended therapy on an out-patient based then possible PT/OT with [facility]. RN verified pt continue to use her walker at all times to prevent risk of falls (verified). RN discussed the current plan of care and adjusted interventions accordingly with ongoing encouraged. RN stressed the risk involved if pt is not adherent to use of her devices to prevent falls/injuires. Pt verbalized an understanding and agreed with the ongoing plan of care and will continue to adherence the goals of care that have been discussed.   Will follow up next week concerning pt progress.  THN CM Care Plan Problem One     Most Recent Value  Care Plan Problem One  Impaired safety-Falls related to balance and difficulty with gait  Role Documenting the Problem One  Care Management [place] for Problem One  Active  THN Long Term Goal   Pt will demonstrate use of safety measures to prevent falls within the next 90 days.  THN Long Term Goal Start Date  12/01/18  Interventions for Problem One Long Term Goal  Will continue to verify pt uses her assisted device for mobility and using safety precautions due to nogoing cass wear. Will strongly encourage pt to continue participation with her ongong therapist with ROM and suggestions for exercises at home. Will extend and continue to evaluate this goal accordingly.   THN CM Short Term Goal #1   Adherence with post-op SNF medications within the next 30  days.  THN CM Short Term Goal #1 Start Date  12/01/18  Interventions for Short Term Goal #1  Will continue to encouraged adherenc with this goal and verify pt's knowledge based on all her medications. Will extend to allow ongoing adherence with medication administration.  THN CM Short Term Goal #2   Adherence with attending all scheduled medical appointments within the next 30 days.  THN CM Short Term Goal #2 Start Date  12/01/18  [facility] CM Short Term Goal #2 Met Date  12/15/18  Interventions for Short Term Goal #2  Pt continue to attend all her medical appointments with sufficient sources. Will mark this goal met due to the ongoing support from her family hired assistance (aide)      Raina Mina, RN Care Management Coordinator [facility] [phone]

## 2018-12-16 DIAGNOSIS — I10 Essential (primary) hypertension: Secondary | ICD-10-CM | POA: Diagnosis not present

## 2018-12-16 DIAGNOSIS — M545 Low back pain: Secondary | ICD-10-CM | POA: Diagnosis not present

## 2018-12-16 DIAGNOSIS — E1165 Type 2 diabetes mellitus with hyperglycemia: Secondary | ICD-10-CM | POA: Diagnosis not present

## 2018-12-16 DIAGNOSIS — S82035D Nondisplaced transverse fracture of left patella, subsequent encounter for closed fracture with routine healing: Secondary | ICD-10-CM | POA: Diagnosis not present

## 2018-12-16 DIAGNOSIS — M25572 Pain in left ankle and joints of left foot: Secondary | ICD-10-CM | POA: Diagnosis not present

## 2018-12-16 DIAGNOSIS — M1712 Unilateral primary osteoarthritis, left knee: Secondary | ICD-10-CM | POA: Diagnosis not present

## 2018-12-19 ENCOUNTER — Other Ambulatory Visit: Payer: Self-pay

## 2018-12-19 ENCOUNTER — Ambulatory Visit (INDEPENDENT_AMBULATORY_CARE_PROVIDER_SITE_OTHER): Payer: Medicare Other | Admitting: Podiatry

## 2018-12-19 VITALS — Temp 97.9°F

## 2018-12-19 DIAGNOSIS — B351 Tinea unguium: Secondary | ICD-10-CM | POA: Diagnosis not present

## 2018-12-19 DIAGNOSIS — S82035D Nondisplaced transverse fracture of left patella, subsequent encounter for closed fracture with routine healing: Secondary | ICD-10-CM | POA: Diagnosis not present

## 2018-12-19 DIAGNOSIS — E0842 Diabetes mellitus due to underlying condition with diabetic polyneuropathy: Secondary | ICD-10-CM

## 2018-12-19 DIAGNOSIS — M545 Low back pain: Secondary | ICD-10-CM | POA: Diagnosis not present

## 2018-12-19 DIAGNOSIS — I10 Essential (primary) hypertension: Secondary | ICD-10-CM | POA: Diagnosis not present

## 2018-12-19 DIAGNOSIS — L84 Corns and callosities: Secondary | ICD-10-CM

## 2018-12-19 DIAGNOSIS — M79674 Pain in right toe(s): Secondary | ICD-10-CM

## 2018-12-19 DIAGNOSIS — M79675 Pain in left toe(s): Secondary | ICD-10-CM | POA: Diagnosis not present

## 2018-12-19 DIAGNOSIS — M1712 Unilateral primary osteoarthritis, left knee: Secondary | ICD-10-CM | POA: Diagnosis not present

## 2018-12-19 DIAGNOSIS — M25572 Pain in left ankle and joints of left foot: Secondary | ICD-10-CM | POA: Diagnosis not present

## 2018-12-19 DIAGNOSIS — E1165 Type 2 diabetes mellitus with hyperglycemia: Secondary | ICD-10-CM | POA: Diagnosis not present

## 2018-12-19 NOTE — Patient Instructions (Signed)
Diabetes Mellitus and Foot Care Foot care is an important part of your health, especially when you have diabetes. Diabetes Scifres cause you to have problems because of poor blood flow (circulation) to your feet and legs, which can cause your skin to:  Become thinner and drier.  Break more easily.  Heal more slowly.  Peel and crack. You Galano also have nerve damage (neuropathy) in your legs and feet, causing decreased feeling in them. This means that you Trindade not notice minor injuries to your feet that could lead to more serious problems. Noticing and addressing any potential problems early is the best way to prevent future foot problems. How to care for your feet Foot hygiene  Wash your feet daily with warm water and mild soap. Do not use hot water. Then, pat your feet and the areas between your toes until they are completely dry. Do not soak your feet as this can dry your skin.  Trim your toenails straight across. Do not dig under them or around the cuticle. File the edges of your nails with an emery board or nail file.  Apply a moisturizing lotion or petroleum jelly to the skin on your feet and to dry, brittle toenails. Use lotion that does not contain alcohol and is unscented. Do not apply lotion between your toes. Shoes and socks  Wear clean socks or stockings every day. Make sure they are not too tight. Do not wear knee-high stockings since they Carawan decrease blood flow to your legs.  Wear shoes that fit properly and have enough cushioning. Always look in your shoes before you put them on to be sure there are no objects inside.  To break in new shoes, wear them for just a few hours a day. This prevents injuries on your feet. Wounds, scrapes, corns, and calluses  Check your feet daily for blisters, cuts, bruises, sores, and redness. If you cannot see the bottom of your feet, use a mirror or ask someone for help.  Do not cut corns or calluses or try to remove them with medicine.  If you  find a minor scrape, cut, or break in the skin on your feet, keep it and the skin around it clean and dry. You Kosak clean these areas with mild soap and water. Do not clean the area with peroxide, alcohol, or iodine.  If you have a wound, scrape, corn, or callus on your foot, look at it several times a day to make sure it is healing and not infected. Check for: ? Redness, swelling, or pain. ? Fluid or blood. ? Warmth. ? Pus or a bad smell. General instructions  Do not cross your legs. This Sethi decrease blood flow to your feet.  Do not use heating pads or hot water bottles on your feet. They Dowen burn your skin. If you have lost feeling in your feet or legs, you Vanorder not know this is happening until it is too late.  Protect your feet from hot and cold by wearing shoes, such as at the beach or on hot pavement.  Schedule a complete foot exam at least once a year (annually) or more often if you have foot problems. If you have foot problems, report any cuts, sores, or bruises to your health care provider immediately. Contact a health care provider if:  You have a medical condition that increases your risk of infection and you have any cuts, sores, or bruises on your feet.  You have an injury that is not   healing.  You have redness on your legs or feet.  You feel burning or tingling in your legs or feet.  You have pain or cramps in your legs and feet.  Your legs or feet are numb.  Your feet always feel cold.  You have pain around a toenail. Get help right away if:  You have a wound, scrape, corn, or callus on your foot and: ? You have pain, swelling, or redness that gets worse. ? You have fluid or blood coming from the wound, scrape, corn, or callus. ? Your wound, scrape, corn, or callus feels warm to the touch. ? You have pus or a bad smell coming from the wound, scrape, corn, or callus. ? You have a fever. ? You have a red line going up your leg. Summary  Check your feet every day  for cuts, sores, red spots, swelling, and blisters.  Moisturize feet and legs daily.  Wear shoes that fit properly and have enough cushioning.  If you have foot problems, report any cuts, sores, or bruises to your health care provider immediately.  Schedule a complete foot exam at least once a year (annually) or more often if you have foot problems. This information is not intended to replace advice given to you by your health care provider. Make sure you discuss any questions you have with your health care provider. Document Released: 04/17/2000 Document Revised: 06/02/2017 Document Reviewed: 05/22/2016 Elsevier Patient Education  2020 Elsevier Inc.  Corns and Calluses Corns are small areas of thickened skin that occur on the top, sides, or tip of a toe. They contain a cone-shaped core with a point that can press on a nerve below. This causes pain.  Calluses are areas of thickened skin that can occur anywhere on the body, including the hands, fingers, palms, soles of the feet, and heels. Calluses are usually larger than corns. What are the causes? Corns and calluses are caused by rubbing (friction) or pressure, such as from shoes that are too tight or do not fit properly. What increases the risk? Corns are more likely to develop in people who have misshapen toes (toe deformities), such as hammer toes. Calluses can occur with friction to any area of the skin. They are more likely to develop in people who:  Work with their hands.  Wear shoes that fit poorly, are too tight, or are high-heeled.  Have toe deformities. What are the signs or symptoms? Symptoms of a corn or callus include:  A hard growth on the skin.  Pain or tenderness under the skin.  Redness and swelling.  Increased discomfort while wearing tight-fitting shoes, if your feet are affected. If a corn or callus becomes infected, symptoms Lerette include:  Redness and swelling that gets worse.  Pain.  Fluid, blood, or  pus draining from the corn or callus. How is this diagnosed? Corns and calluses Rohr be diagnosed based on your symptoms, your medical history, and a physical exam. How is this treated? Treatment for corns and calluses Giampietro include:  Removing the cause of the friction or pressure. This Hillis involve: ? Changing your shoes. ? Wearing shoe inserts (orthotics) or other protective layers in your shoes, such as a corn pad. ? Wearing gloves.  Applying medicine to the skin (topical medicine) to help soften skin in the hardened, thickened areas.  Removing layers of dead skin with a file to reduce the size of the corn or callus.  Removing the corn or callus with a scalpel or   laser.  Taking antibiotic medicines, if your corn or callus is infected.  Having surgery, if a toe deformity is the cause. Follow these instructions at home:   Take over-the-counter and prescription medicines only as told by your health care provider.  If you were prescribed an antibiotic, take it as told by your health care provider. Do not stop taking it even if your condition starts to improve.  Wear shoes that fit well. Avoid wearing high-heeled shoes and shoes that are too tight or too loose.  Wear any padding, protective layers, gloves, or orthotics as told by your health care provider.  Soak your hands or feet and then use a file or pumice stone to soften your corn or callus. Do this as told by your health care provider.  Check your corn or callus every day for symptoms of infection. Contact a health care provider if you:  Notice that your symptoms do not improve with treatment.  Have redness or swelling that gets worse.  Notice that your corn or callus becomes painful.  Have fluid, blood, or pus coming from your corn or callus.  Have new symptoms. Summary  Corns are small areas of thickened skin that occur on the top, sides, or tip of a toe.  Calluses are areas of thickened skin that can occur anywhere  on the body, including the hands, fingers, palms, and soles of the feet. Calluses are usually larger than corns.  Corns and calluses are caused by rubbing (friction) or pressure, such as from shoes that are too tight or do not fit properly.  Treatment Zmuda include wearing any padding, protective layers, gloves, or orthotics as told by your health care provider. This information is not intended to replace advice given to you by your health care provider. Make sure you discuss any questions you have with your health care provider. Document Released: 01/25/2004 Document Revised: 08/10/2018 Document Reviewed: 03/03/2017 Elsevier Patient Education  2020 Elsevier Inc.  Onychomycosis/Fungal Toenails  WHAT IS IT? An infection that lies within the keratin of your nail plate that is caused by a fungus.  WHY ME? Fungal infections affect all ages, sexes, races, and creeds.  There Buenger be many factors that predispose you to a fungal infection such as age, coexisting medical conditions such as diabetes, or an autoimmune disease; stress, medications, fatigue, genetics, etc.  Bottom line: fungus thrives in a warm, moist environment and your shoes offer such a location.  IS IT CONTAGIOUS? Theoretically, yes.  You do not want to share shoes, nail clippers or files with someone who has fungal toenails.  Walking around barefoot in the same room or sleeping in the same bed is unlikely to transfer the organism.  It is important to realize, however, that fungus can spread easily from one nail to the next on the same foot.  HOW DO WE TREAT THIS?  There are several ways to treat this condition.  Treatment Canale depend on many factors such as age, medications, pregnancy, liver and kidney conditions, etc.  It is best to ask your doctor which options are available to you.  1. No treatment.   Unlike many other medical concerns, you can live with this condition.  However for many people this can be a painful condition and Appelt  lead to ingrown toenails or a bacterial infection.  It is recommended that you keep the nails cut short to help reduce the amount of fungal nail. 2. Topical treatment.  These range from herbal remedies to prescription   strength nail lacquers.  About 40-50% effective, topicals require twice daily application for approximately 9 to 12 months or until an entirely new nail has grown out.  The most effective topicals are medical grade medications available through physicians offices. 3. Oral antifungal medications.  With an 80-90% cure rate, the most common oral medication requires 3 to 4 months of therapy and stays in your system for a year as the new nail grows out.  Oral antifungal medications do require blood work to make sure it is a safe drug for you.  A liver function panel will be performed prior to starting the medication and after the first month of treatment.  It is important to have the blood work performed to avoid any harmful side effects.  In general, this medication safe but blood work is required. 4. Laser Therapy.  This treatment is performed by applying a specialized laser to the affected nail plate.  This therapy is noninvasive, fast, and non-painful.  It is not covered by insurance and is therefore, out of pocket.  The results have been very good with a 80-95% cure rate.  The [facility] is the only practice in the area to offer this therapy. 5. Permanent Nail Avulsion.  Removing the entire nail so that a new nail will not grow back. 

## 2018-12-21 ENCOUNTER — Other Ambulatory Visit: Payer: Self-pay | Admitting: *Deleted

## 2018-12-21 NOTE — Patient Outreach (Signed)
[place] [facility]) Care Management  12/21/2018  [name] [birth year] [id number]    Transition of care -Falls  Unsuccessful outreach to pt today and unable to leave a message for follow.  Will attempted another outreach call within 4 business days for ongoing [facility] services.  Raina Mina, RN Care Management Coordinator [place] Office [phone]

## 2018-12-26 DIAGNOSIS — M25562 Pain in left knee: Secondary | ICD-10-CM | POA: Diagnosis not present

## 2018-12-27 ENCOUNTER — Other Ambulatory Visit: Payer: Self-pay | Admitting: *Deleted

## 2018-12-27 NOTE — Patient Outreach (Signed)
[facility]) Care Management  12/27/2018  [name] [birth year] [id number]  Transition of care-2nd unsuccessful outreach  RN attempt outreach for ongoing transition of care however unsuccessful. RN able to leave a HIPAA approved voice message requesting a call back however no response on the last two calls.   Will send outreach letter and await or a call back. RN will also scheduled one additional outreach call for ongoing [facility] services within the next 4 business days. Will update the plan of care at that time.  Raina Mina, RN Care Management Coordinator [place] Office [phone]

## 2018-12-28 ENCOUNTER — Encounter: Payer: Self-pay | Admitting: Podiatry

## 2018-12-28 ENCOUNTER — Encounter (HOSPITAL_BASED_OUTPATIENT_CLINIC_OR_DEPARTMENT_OTHER): Payer: Self-pay

## 2018-12-28 ENCOUNTER — Other Ambulatory Visit: Payer: Self-pay

## 2018-12-28 ENCOUNTER — Emergency Department (HOSPITAL_BASED_OUTPATIENT_CLINIC_OR_DEPARTMENT_OTHER)
Admission: EM | Admit: 2018-12-28 | Discharge: 2018-12-28 | Disposition: A | Discharge status: Home / Self Care | Payer: Medicare Other | Attending: Emergency Medicine | Admitting: Emergency Medicine

## 2018-12-28 ENCOUNTER — Emergency Department (HOSPITAL_BASED_OUTPATIENT_CLINIC_OR_DEPARTMENT_OTHER): Payer: Medicare Other

## 2018-12-28 DIAGNOSIS — Z853 Personal history of malignant neoplasm of breast: Secondary | ICD-10-CM | POA: Insufficient documentation

## 2018-12-28 DIAGNOSIS — S0990XA Unspecified injury of head, initial encounter: Secondary | ICD-10-CM | POA: Insufficient documentation

## 2018-12-28 DIAGNOSIS — J45909 Unspecified asthma, uncomplicated: Secondary | ICD-10-CM | POA: Diagnosis not present

## 2018-12-28 DIAGNOSIS — E119 Type 2 diabetes mellitus without complications: Secondary | ICD-10-CM | POA: Insufficient documentation

## 2018-12-28 DIAGNOSIS — S199XXA Unspecified injury of neck, initial encounter: Secondary | ICD-10-CM | POA: Diagnosis not present

## 2018-12-28 DIAGNOSIS — I10 Essential (primary) hypertension: Secondary | ICD-10-CM | POA: Insufficient documentation

## 2018-12-28 DIAGNOSIS — Z79899 Other long term (current) drug therapy: Secondary | ICD-10-CM | POA: Diagnosis not present

## 2018-12-28 DIAGNOSIS — W010XXA Fall on same level from slipping, tripping and stumbling without subsequent striking against object, initial encounter: Secondary | ICD-10-CM | POA: Diagnosis not present

## 2018-12-28 DIAGNOSIS — Z794 Long term (current) use of insulin: Secondary | ICD-10-CM | POA: Insufficient documentation

## 2018-12-28 DIAGNOSIS — Y999 Unspecified external cause status: Secondary | ICD-10-CM | POA: Insufficient documentation

## 2018-12-28 DIAGNOSIS — Y92000 Kitchen of unspecified non-institutional (private) residence as  the place of occurrence of the external cause: Secondary | ICD-10-CM | POA: Insufficient documentation

## 2018-12-28 DIAGNOSIS — Z885 Allergy status to narcotic agent status: Secondary | ICD-10-CM | POA: Diagnosis not present

## 2018-12-28 DIAGNOSIS — E785 Hyperlipidemia, unspecified: Secondary | ICD-10-CM | POA: Insufficient documentation

## 2018-12-28 DIAGNOSIS — W19XXXA Unspecified fall, initial encounter: Secondary | ICD-10-CM

## 2018-12-28 DIAGNOSIS — Y9389 Activity, other specified: Secondary | ICD-10-CM | POA: Diagnosis not present

## 2018-12-28 DIAGNOSIS — Z888 Allergy status to other drugs, medicaments and biological substances status: Secondary | ICD-10-CM | POA: Diagnosis not present

## 2018-12-28 MED ORDER — ACETAMINOPHEN 325 MG PO TABS
650.0000 mg | ORAL_TABLET | Freq: Once | ORAL | Status: DC
  Filled 2018-12-28: qty 2

## 2018-12-28 MED ORDER — FENTANYL CITRATE (PF) 100 MCG/2ML IJ SOLN: 12.5000 ug | Freq: Once | INTRAMUSCULAR | Status: DC

## 2018-12-28 MED ORDER — ONDANSETRON HCL 4 MG/2ML IJ SOLN: 2.0000 mg | Freq: Once | INTRAMUSCULAR | Status: DC

## 2018-12-28 NOTE — Discharge Instructions (Addendum)
You have been diagnosed today with fall and head injury.  At this time there does not appear to be the presence of an emergent medical condition, however there is always the potential for conditions to change. Please read and follow the below instructions.  Please return to the Emergency Department immediately for any new or worsening symptoms. Please be sure to follow up with your Primary Care Provider within one week regarding your visit today; please call their office to schedule an appointment even if you are feeling better for a follow-up visit. Please use your walker with ambulation to avoid future falls.  Get help right away if: You have: A very bad headache that is not helped by medicine. Trouble walking or weakness in your arms and legs. Clear or bloody fluid coming from your nose or ears. Changes in how you see (vision). Shaking movements that you cannot control. You lose your balance. You vomit. The black centers of your eyes (pupils) change in size. Your speech is slurred. Your dizziness gets worse. You pass out. You are sleepier than normal and have trouble staying awake. Your symptoms get worse. You have any new/concerning or worsening symptoms  Please read the additional information packets attached to your discharge summary.

## 2018-12-28 NOTE — TOC Initial Note (Addendum)
Transition of Care [facility]) - Initial/Assessment Note    Patient Details  Name: [name] MRN: [id number] Date of Birth: 05/23/1929  Transition of Care [facility]) CM/SW Contact:    Erenest Rasher, RN Phone Number: [phone] 12/28/2018, 6:15 PM  Clinical Narrative:                 Spoke to pt and offered choice for [facility]. Pt agreeable to [facility] program. States she pays for a caregiver to come during the day. She has RW at home. Explained Home First program and that program maybe able to provide a caregiver for evening hours along with HHPT, RN and OT. States her caregiver can provide a ride home for her tonight and put her to bed this evening. She has a bedside commode at home and will have caregiver put by her bed prior to leaving.   Discussed wheelchair, pt declined.  Expected Discharge Plan: [place] Barriers to Discharge: No Barriers Identified   Patient Goals and CMS Choice Patient states their goals for this hospitalization and ongoing recovery are:: want to be able to stay in home CMS Medicare.gov Compare Post Acute Care list provided to:: Patient Choice offered to / list presented to : Patient  Expected Discharge Plan and Services Expected Discharge Plan: [place]   Discharge Planning Services: CM Consult Post Acute Care Choice: [place] arrangements for the past 2 months: Single Family Home                           HH Arranged: RN, PT, OT, Nurse's Aide, Social Work [employer] Agency: [place] Date [place] [facility] Agency Contacted: 12/28/18 Time [place]: [id number] Representative spoke with at [place]: Adela Lank  Prior Living Arrangements/Services Living arrangements for the past 2 months: [place] with:: Self Patient language and need for interpreter reviewed:: Yes Do you feel safe going back to the place where you live?: Yes      Need for Family Participation in Patient Care: Yes  (Comment) Care giver support system in place?: Yes (comment) Current home services: Sitter, DME(rolling walker) Criminal Activity/Legal Involvement Pertinent to Current Situation/Hospitalization: No - Comment as needed  Activities of Daily Living      Permission Sought/Granted Permission sought to share information with : Case Manager, Family Supports, Other (comment), PCP Permission granted to share information with : Yes, Verbal Permission Granted  Share Information with NAME: Emelda Fear  Permission granted to share info w AGENCY: [name], [place] granted to share info w Relationship: sister  Permission granted to share info w Contact Information: [phone]  Emotional Assessment   Attitude/Demeanor/Rapport: Engaged Affect (typically observed): Accepting, Pleasant Orientation: : Oriented to Self, Oriented to Place, Oriented to  Time, Oriented to Situation   Psych Involvement: No (comment)  Admission diagnosis:  FALL/  HEAD, NECK SHOULDER AND LEFT ARM PAIN Patient Active Problem List   Diagnosis Date Noted  . Hypokalemia   . Effusion of left knee joint   . Hyponatremia   . Left patella fracture 10/26/2018  . Acquired hypothyroidism 08/25/2017  . 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 Hammer toe 08/25/2017  . History of breast cancer 08/25/2017  . Nontoxic thyroid nodule 08/25/2017  . Osteopetrosis 08/25/2017  . Dysuria 01/19/2017  . Vitamin D deficiency 11/18/2016  . Tremor, essential 08/18/2016  .  Convulsions/seizures ([place]) 10/16/2014  . Brain tumor (benign) ([place]) 10/18/2013  . Subdural hemorrhage ([place]) 10/09/2013  . Anxiety 10/09/2013  . Compression fracture of T12 vertebra ([place]) 10/09/2013  . Ejection fraction   . Multiple thyroid nodules 09/05/2013  . Syncope 08/27/2013  . Carotid artery disease ([place]) 08/27/2013  . Abnormal thyroid ultrasound 08/27/2013  . Cancer of right breast ([place])   . Cancer of central portion  of female breast ([place]) 12/30/2012  . Osteoporosis 03/06/2010  . ASTHMA 03/05/2009  . IRRITABLE BOWEL SYNDROME  diarrhea type 03/21/2008  . ANEMIA-NOS 12/16/2006  . GERD 12/16/2006  . Insulin dependent diabetes mellitus ([place]) 10/29/2006  . Hyperlipidemia 10/29/2006  . Essential hypertension 10/29/2006  . Allergic rhinitis, cause unspecified 10/29/2006  . OSTEOARTHRITIS 10/29/2006   PCP:  [name], MD Pharmacy:   [facility] [phone] [address] [place], [place] AT [place] [address] [place] [address] Phone: [phone] Fax: [phone]  [facility] [place], [place] [address] [place] [address] Phone: [phone] Fax: [phone]     Social Determinants of Health (SDOH) Interventions    Readmission Risk Interventions No flowsheet data found.

## 2018-12-28 NOTE — ED Provider Notes (Signed)
[facility] EMERGENCY DEPARTMENT Provider Note   CSN: [id number] Arrival date & time: 12/28/18  1449     History   Chief Complaint Chief Complaint  Patient presents with   Fall    HPI [name] is a 85 y.o. female presenting today after fall.  Patient lives home alone and reports that she lost her balance falling backwards onto her kitchen floor striking the back of her head.  She denies any loss of consciousness or blood thinner use.  She describes a mild throbbing sensation to the back of her head and left side of her neck that has been constant since her fall worsened with movement and palpation and without alleviating factors, she took tramadol and Xanax prior to arrival with minimal relief.  She denies any other injury today.  Denies vision changes, numbness/weakness, tingling, chest pain/shortness breath, abdominal pain, nausea/vomiting or pain to the extremity x4.     HPI  Past Medical History:  Diagnosis Date   Anemia    Anxiety    Breast cancer ([place]) 2014   right breast   Cancer of right breast ([place]) 12/26/12   right breast 12:00 o'clock, DCIS   Chronic cough    Chronic facial pain    Chronic foot pain    Complication of anesthesia    Sore jaw; could not chew or move mouth   Convulsions/seizures ([place]) 10/16/2014   Diabetes mellitus    type 2 niddm x 20 years   Dyslipidemia    Ejection fraction    GERD (gastroesophageal reflux disease)    Hammer toe    bilateral   History of colonic polyps    HTN (hypertension)    Hx of radiation therapy 03/07/13- 03/29/13   right breast 4250 cGy 17 sessions   Hyperlipidemia    Melanoma ([place])    Metatarsal bone fracture 2014   Multiple drug allergies    Obesity    Osteoarthritis    Palpitations    Personal history of radiation therapy 2014   Seasonal allergies    Skin cancer    Tremor, essential 08/18/2016   Vitamin B12 deficiency     Patient Active Problem List   Diagnosis Date  Noted   Hypokalemia    Effusion of left knee joint    Hyponatremia    Left patella fracture 10/26/2018   Acquired hypothyroidism 08/25/2017   H/O excision of tumor of brain meninges 08/25/2017   Hammer toe 08/25/2017   History of breast cancer 08/25/2017   Nontoxic thyroid nodule 08/25/2017   Osteopetrosis 08/25/2017   Dysuria 01/19/2017   Vitamin D deficiency 11/18/2016   Tremor, essential 08/18/2016   Convulsions/seizures ([place]) 10/16/2014   Brain tumor (benign) ([place]) 10/18/2013   Subdural hemorrhage ([place]) 10/09/2013   Anxiety 10/09/2013   Compression fracture of T12 vertebra ([place]) 10/09/2013   Ejection fraction    Multiple thyroid nodules 09/05/2013   Syncope 08/27/2013   Carotid artery disease ([place]) 08/27/2013   Abnormal thyroid ultrasound 08/27/2013   Cancer of right breast ([place])    Cancer of central portion of female breast ([place]) 12/30/2012   Osteoporosis 03/06/2010   ASTHMA 03/05/2009   IRRITABLE BOWEL SYNDROME  diarrhea type 03/21/2008   ANEMIA-NOS 12/16/2006   GERD 12/16/2006   Insulin dependent diabetes mellitus ([place]) 10/29/2006   Hyperlipidemia 10/29/2006   Essential hypertension 10/29/2006   Allergic rhinitis, cause unspecified 10/29/2006   OSTEOARTHRITIS 10/29/2006    Past Surgical History:  Procedure Laterality Date   ABDOMINAL  BRAIN SURGERY    °• BREAST BIOPSY Right 01/24/2013  ° Procedure: RE-EXCICION OF BREAST CANCER, ANTERIOR MARGINS;  Surgeon: Benjamin T Hoxworth, MD;  Location: [facility] ORS;  Service: General;  Laterality: Right;  °• BREAST LUMPECTOMY Right 2014  °• BREAST LUMPECTOMY WITH NEEDLE LOCALIZATION Right 01/17/2013  ° Procedure: BREAST LUMPECTOMY WITH NEEDLE LOCALIZATION;  Surgeon: Benjamin T Hoxworth, MD;  Location: [facility] OR;  Service: General;  Laterality: Right;  °• BUNIONECTOMY    °• CATARACT EXTRACTION W/ INTRAOCULAR LENS IMPLANT Right   °• CHOLECYSTECTOMY    °• COLONOSCOPY    °• CRANIOTOMY Right  10/18/2013  ° Procedure: CRANIOTOMY TUMOR EXCISION;  Surgeon: Ernesto M Botero, MD;  Location: [facility];  Service: Neurosurgery;  Laterality: Right;  °• EYE SURGERY    °• HERNIA REPAIR    °• KNEE ARTHROSCOPY Bilateral   °• POLYPECTOMY    ° small adenomatous  °• ULNAR TUNNEL RELEASE    °  ° °OB History   °No obstetric history on file. °  ° ° ° °Home Medications   ° °Prior to Admission medications   °Medication Sig Start Date End Date Taking? Authorizing Provider  °ALPRAZolam (XANAX) 0.25 MG tablet alprazolam 0.25 mg tablet °  0.25 mg by oral route. 07/25/18   [provider]  °Biotin 1000 MCG tablet Take 1,000 mcg by mouth daily.     [provider]  °Blood Glucose Monitoring Suppl (PRODIGY VOICE BLOOD GLUCOSE) w/Device KIT Use to check blood sugar 1 time per day. 10/18/15   Ellison, Sean, MD  °calcitonin, salmon, (MIACALCIN/FORTICAL) 200 UNIT/ACT nasal spray  11/29/18   [provider]  °Cholecalciferol 5000 units capsule Take 10,000 Units by mouth 2 (two) times a day.     [provider]  °cyanocobalamin (,VITAMIN B-12,) 1000 MCG/ML injection INJECT 1 ML INTRAMUSCULARLY EVERY 21 DAYS °Patient taking differently: Inject 1,000 mcg into the muscle every 21 ( twenty-one) days.  06/18/16   Crawford, Elizabeth A, MD  °DiphenhydrAMINE HCl (BENADRYL ALLERGY PO) Take 1 tablet by mouth as needed.    [provider]  °empagliflozin (JARDIANCE) 10 MG TABS tablet Take 10 mg by mouth daily.    [provider]  °escitalopram (LEXAPRO) 10 MG tablet Take 10 mg by mouth daily.    [provider]  °famotidine (PEPCID) 20 MG tablet Take 20 mg by mouth daily.  07/25/18   [provider]  °furosemide (LASIX) 20 MG tablet  11/28/18   [provider]  °glucose blood test strip Use to check blood sugar 1 time per day. 10/22/15   Ellison, Sean, MD  °insulin glargine (LANTUS) 100 UNIT/ML injection Inject 14 Units into the skin at bedtime.     [provider]  °insulin lispro (HUMALOG KWIKPEN) 100 UNIT/ML KiwkPen Inject 0.07 mLs (7 Units total) into the skin 3 (three) times daily with meals. And pen needles 1/day °Patient not taking: Reported on 12/01/2018 05/24/17   Ellison, Sean, MD  °Insulin Pen Needle 32G X 4 MM MISC Used to inject insulin 3x daily 11/19/16   Ellison, Sean, MD  °levothyroxine (SYNTHROID) 75 MCG tablet Take 75 mcg by mouth daily before breakfast.    [provider]  °lidocaine (LIDODERM) 5 % Place 1 patch onto the skin daily. Remove & Discard patch within 12 hours or as directed by MD °Patient taking differently: Place 1-2 patches onto the skin daily as needed (pain). Remove & Discard patch within 12 hours or as directed by MD   01/25/15   Crawford, Elizabeth A, MD  °loperamide (IMODIUM) 2 MG capsule Take 2 mg by mouth as needed for diarrhea or loose stools.    [provider]  °oxyCODONE-acetaminophen (PERCOCET/ROXICET) 5-325 MG tablet Take 1-2 tablets by mouth every 4 (four) hours as needed for severe pain. °Patient not taking: Reported on 12/01/2018 10/25/18   Ward, Jaime Pilcher, PA-C  °phenytoin (DILANTIN) 100 MG ER capsule 2 capsules at bedtime and 1 in the morning °Patient taking differently: Take 1-2 mg by mouth 2 (two) times daily. Take 1 capsule (100 mg) in the morning and Take 2 capsules (200 mg) at bedtime 06/27/18   [name], Charles K, MD  °PHENYTOIN INFATABS 50 MG tablet CHEW 1 TABLET DAILY °Patient taking differently: Chew 50 mg by mouth daily. Taking differently total at 150 mg AM and 200 MG PM 07/25/18   [name], Charles K, MD  °Polyethyl Glycol-Propyl Glycol 0.4-0.3 % SOLN Place 2 drops into both eyes 2 (two) times a day.     [provider]  °primidone (MYSOLINE) 50 MG tablet Take 2 tablets by mouth twice daily 07/14/18   [name], Charles K, MD  °Probiotic Product (ALIGN) 4 MG CAPS Take 1 capsule by mouth 3 (three) times daily after meals. Taking differently 1 cap daily    [provider]  °saccharomyces  boulardii (FLORASTOR) 250 MG capsule Take 250 mg by mouth 2 (two) times daily. Taking differently 1 tab daily    [provider]  °traMADol (ULTRAM) 50 MG tablet tramadol 50 mg tablet °  50 mg by oral route.    [provider]  °trandolapril-verapamil (TARKA) 2-240 MG tablet Take 1 tablet by mouth daily.    [provider]  °verapamil (CALAN-SR) 240 MG CR tablet verapamil ER (SR) 240 mg tablet,extended release ° TAKE 1 TABLET BY MOUTH EVERY DAY    [provider]  ° ° °Family History °Family History  °Problem Relation Age of Onset  °• Heart disease Mother   °• Osteoporosis Mother   °• Diabetes Father   °• Pancreatic cancer Father   °• Colon cancer Other   °• Bone cancer Sister   °• Prostate cancer Brother   °• Colon cancer Brother   °• Rectal cancer Sister   °• Thyroid disease Sister   °     benign goiter resected  ° ° °Social History °Social History  ° °Tobacco Use  °• Smoking status: Never Smoker  °• Smokeless tobacco: Never Used  °Substance Use Topics  °• Alcohol use: No  °• Drug use: No  ° ° ° °Allergies   °Bystolic [nebivolol hcl], Cholestyramine, Hydrazine yellow [tartrazine], Morphine, Niacin, Niaspan [niacin er], Norvasc [amlodipine besylate], Optivar [azelastine hcl], Repaglinide, Sular [nisoldipine er], Tegretol [carbamazepine], Telmisartan, Cefdinir, Clonidine, Clonidine hydrochloride, Codeine, Ezetimibe, Naproxen, Ziac [bisoprolol-hydrochlorothiazide], Elavil [amitriptyline], Hydralazine, Iodine, Keppra [levetiracetam], Lamictal [lamotrigine], Lyrica [pregabalin], Pseudoephedrine, Topamax [topiramate], Ace inhibitors, Actonel [risedronate sodium], Amlodipine besylate, Aspirin, Atacand [candesartan], Bextra [valdecoxib], Bisoprolol-hydrochlorothiazide, Candesartan cilexetil, Cefadroxil, Celecoxib, Hydrocodone, Hydrocodone-acetaminophen, Iodinated diagnostic agents, Meloxicam, Methylprednisolone sodium succinate, Nabumetone, Penicillins, Pseudoephedrine-guaifenesin,  Risedronate sodium, Rofecoxib, Ru-tuss [chlorphen-pse-atrop-hyos-scop], Sulfonamide derivatives, Sulphur [sulfur], Telithromycin, Terfenadine, Trandolapril-verapamil hcl er, Trandolapril-verapamil hcl er, and Valium [diazepam] ° ° °Review of Systems °Review of Systems Ten systems are reviewed and are negative for acute change except as noted in the HPI ° °Physical Exam °Updated Vital Signs °BP (!) 150/81 (BP Location: Left Arm)    Pulse 82    Temp 98.9 °F (37.2 °C) (Oral)    Resp 20      Resp 20    Ht 5' 7" (1.702 m)    Wt 59 kg    SpO2 99%    BMI 20.36 kg/m   Physical Exam Constitutional:      General: She is not in acute distress.    Appearance: Normal appearance. She is well-developed. She is not ill-appearing or diaphoretic.  HENT:     Head: Normocephalic and atraumatic. No raccoon eyes or Battle's sign.     Jaw: There is normal jaw occlusion. No trismus.     Comments: Patient reports tenderness to palpation of the occipital scalp, no injuries visible.    Right Ear: External ear normal.     Left Ear: External ear normal.     Nose: Nose normal. No rhinorrhea.     Right Nostril: No epistaxis.     Left Nostril: No epistaxis.     Mouth/Throat:     Mouth: Mucous membranes are moist.     Pharynx: Oropharynx is clear.  Eyes:     General: Vision grossly intact. Gaze aligned appropriately.     Extraocular Movements: Extraocular movements intact.     Pupils: Pupils are equal, round, and reactive to light.  Neck:     Musculoskeletal: Normal range of motion and neck supple.     Trachea: Trachea and phonation normal. No tracheal tenderness or tracheal deviation.      Comments: Patient with mild tenderness of the left paraspinal musculature to palpation. - After negative CT study, hard c-collar was removed, patient with appropriate range of motion of the neck for age.  No crepitus, step-off or deformity.  No midline tenderness.  Pain is primarily left-sided. Cardiovascular:     Rate and Rhythm: Normal rate  and regular rhythm.     Pulses:          Dorsalis pedis pulses are 2+ on the right side and 2+ on the left side.  Pulmonary:     Effort: Pulmonary effort is normal. No accessory muscle usage or respiratory distress.     Breath sounds: Normal breath sounds and air entry.  Chest:     Comments: No sign of injury of the chest Abdominal:     General: There is no distension.     Palpations: Abdomen is soft.     Tenderness: There is no abdominal tenderness. There is no guarding or rebound.     Comments: No sign of injury of the abdomen  Musculoskeletal: Normal range of motion.     Comments: No thoracic or lumbar midline spinal tenderness.  No crepitus step-off or deformity of the thoracic or lumbar spine.  No paraspinal muscular tenderness of the thoracic or lumbar spine.  No sign of injury to the back. - Patient reports that some tenderness of the left elbow since her fall.  She reports that pain is worse when she attempts to push herself up out of bed using her elbows.  No deformity, crepitus or swelling of the elbow.  Neurovascularly intact distally.  No erythema, no skin break or overlying changes.  She has full range of motion and 5/5 strength with all movements of the left upper extremity.  She does not want imaging of the left elbow. - All other major joints palpated and brought the range of motion without pain or crepitus.  Feet:     Right foot:     Protective Sensation: 3 sites tested. 3 sites sensed.     Left foot:     Protective Sensation: 3 sites tested.  3 sites sensed.  Skin:    General: Skin is warm and dry.  Neurological:     Mental Status: She is alert.     GCS: GCS eye subscore is 4. GCS verbal subscore is 5. GCS motor subscore is 6.     Comments: Speech is clear and goal oriented, follows commands Major Cranial nerves without deficit, no facial droop Normal strength in upper and lower extremities bilaterally including dorsiflexion and plantar flexion, strong and equal grip  strength Sensation normal to light and sharp touch Moves extremities without ataxia, coordination intact Normal gait for age  Psychiatric:        Behavior: Behavior normal.    ED Treatments / Results  Labs (all labs ordered are listed, but only abnormal results are displayed) Labs Reviewed - No data to display  EKG None  Radiology Ct Head Wo Contrast  Result Date: 12/28/2018 CLINICAL DATA:  Fall EXAM: CT HEAD WITHOUT CONTRAST CT CERVICAL SPINE WITHOUT CONTRAST TECHNIQUE: Multidetector CT imaging of the head and cervical spine was performed following the standard protocol without intravenous contrast. Multiplanar CT image reconstructions of the cervical spine were also generated. COMPARISON:  10/25/2018 FINDINGS: CT HEAD FINDINGS Brain: No evidence of acute infarction, hemorrhage, hydrocephalus, extra-axial collection or mass lesion/mass effect. Right frontal pole encephalomalacia and overlying right frontal craniotomy. Vascular: No hyperdense vessel or unexpected calcification. Skull: Right frontal craniotomy negative for fracture or focal lesion. Sinuses/Orbits: No acute finding. Other: None. CT CERVICAL SPINE FINDINGS Alignment: Normal. Skull base and vertebrae: No acute fracture. No primary bone lesion or focal pathologic process. Soft tissues and spinal canal: No prevertebral fluid or swelling. No visible canal hematoma. Disc levels: Mild multilevel disc space height loss and osteophytosis, focally moderate at C6-C7. Upper chest: Negative. Other: None. IMPRESSION: 1.  No acute intracranial pathology. 2. Unchanged right frontal pole encephalomalacia and overlying right frontal craniotomy. 3. No fracture or static subluxation of the cervical spine. Multilevel disc degenerative disease. Electronically Signed   By: Eddie Candle M.D.   On: 12/28/2018 16:32   Ct Cervical Spine Wo Contrast  Result Date: 12/28/2018 CLINICAL DATA:  Fall EXAM: CT HEAD WITHOUT CONTRAST CT CERVICAL SPINE WITHOUT  CONTRAST TECHNIQUE: Multidetector CT imaging of the head and cervical spine was performed following the standard protocol without intravenous contrast. Multiplanar CT image reconstructions of the cervical spine were also generated. COMPARISON:  10/25/2018 FINDINGS: CT HEAD FINDINGS Brain: No evidence of acute infarction, hemorrhage, hydrocephalus, extra-axial collection or mass lesion/mass effect. Right frontal pole encephalomalacia and overlying right frontal craniotomy. Vascular: No hyperdense vessel or unexpected calcification. Skull: Right frontal craniotomy negative for fracture or focal lesion. Sinuses/Orbits: No acute finding. Other: None. CT CERVICAL SPINE FINDINGS Alignment: Normal. Skull base and vertebrae: No acute fracture. No primary bone lesion or focal pathologic process. Soft tissues and spinal canal: No prevertebral fluid or swelling. No visible canal hematoma. Disc levels: Mild multilevel disc space height loss and osteophytosis, focally moderate at C6-C7. Upper chest: Negative. Other: None. IMPRESSION: 1.  No acute intracranial pathology. 2. Unchanged right frontal pole encephalomalacia and overlying right frontal craniotomy. 3. No fracture or static subluxation of the cervical spine. Multilevel disc degenerative disease. Electronically Signed   By: Eddie Candle M.D.   On: 12/28/2018 16:32    Procedures Procedures (including critical care time)  Medications Ordered in ED Medications  acetaminophen (TYLENOL) tablet 650 mg (650 mg Oral Not Given 12/28/18 1616)     Initial Impression / Assessment and Plan / ED  Course  I have reviewed the triage vital signs and the nursing notes.  Pertinent labs & imaging results that were available during my care of the patient were reviewed by me and considered in my medical decision making (see chart for details).    Patient arrives well-appearing in no acute distress.  Ambulatory.  No sign of injury.  She has some tenderness to the left cervical  paraspinal musculature as well as some occipital scalp tenderness.  No sign of injury to the chest, abdomen, back or extremities.  She does have some left elbow tenderness but full range of motion, appropriate strength and neurovascular intact she does not want imaging of the elbow and without sign of injury today feel this is reasonable.  Will obtain CT head and cervical spine.  Patient placed in a hard cervical collar. - CT Head/C-spine:    IMPRESSION:  1. No acute intracranial pathology.    2. Unchanged right frontal pole encephalomalacia and overlying right  frontal craniotomy.    3. No fracture or static subluxation of the cervical spine.  Multilevel disc degenerative disease.  - There is concern as patient lives home alone with multiple recent falls.  Additionally she is concerned that she will have difficulty getting out of bed due to soreness.  Case management consulted to assist patient. - Case manager has spoken with patient and her daughter who is at bedside over the phone.  They have set up follow-up for this patient.  I have placed a face-to-face consult for the patient, patient's daughter who is at bedside will be assisting patient until evaluation will be performed tomorrow.  Patient is Lidoderm patches at home to assist with her musculoskeletal pain after her fall. - Patient is now requesting discharge she is fully dressed and with her walker at bedside.  She has no further questions or concerns and states understanding of care plan and is agreeable.  At this time there does not appear to be any evidence of an acute emergency medical condition and the patient appears stable for discharge with appropriate outpatient follow up. Diagnosis was discussed with patient who verbalizes understanding of care plan and is agreeable to discharge. I have discussed return precautions with patient who verbalizes understanding of return precautions. Patient encouraged to follow-up with their  PCP. All questions answered.  Patient seen and evaluated by Dr. Tyrone Nine during this visit who agrees with discharge at this time.  Note: Portions of this report [place] have been transcribed using voice recognition software. Every effort was made to ensure accuracy; however, inadvertent computerized transcription errors [place] still be present. Final Clinical Impressions(s) / ED Diagnoses   Final diagnoses:  Fall, initial encounter  Injury of head, initial encounter    ED Discharge Orders    None       Gari Crown 12/28/18 Daniels, Dan, DO 12/28/18 2104

## 2018-12-28 NOTE — Progress Notes (Signed)
Subjective: [name] is a 85 y.o. y.o. female who presents today with cc of painful, discolored, thick toenails, b/l corns and calluses which interfere with daily activities. Pain is aggravated when wearing enclosed shoe gear and relieved with periodic professional debridement.  [name], MD is her PCP.   She voices no new pedal concerns on today's visit.   Current Outpatient Medications:  .  ALPRAZolam (XANAX) 0.25 MG tablet, alprazolam 0.25 mg tablet   0.25 mg by oral route., Disp: , Rfl:  .  Biotin 1000 MCG tablet, Take 1,000 mcg by mouth daily. , Disp: , Rfl:  .  Blood Glucose Monitoring Suppl (PRODIGY VOICE BLOOD GLUCOSE) w/Device KIT, Use to check blood sugar 1 time per day., Disp: 1 each, Rfl: 2 .  calcitonin, salmon, (MIACALCIN/FORTICAL) 200 UNIT/ACT nasal spray, , Disp: , Rfl:  .  Cholecalciferol 5000 units capsule, Take 10,000 Units by mouth 2 (two) times a day. , Disp: , Rfl:  .  cyanocobalamin (,VITAMIN B-12,) 1000 MCG/ML injection, INJECT 1 ML INTRAMUSCULARLY EVERY 21 DAYS (Patient taking differently: Inject 1,000 mcg into the muscle every 21 ( twenty-one) days. ), Disp: 3 mL, Rfl: 2 .  DiphenhydrAMINE HCl (BENADRYL ALLERGY PO), Take 1 tablet by mouth as needed., Disp: , Rfl:  .  empagliflozin (JARDIANCE) 10 MG TABS tablet, Take 10 mg by mouth daily., Disp: , Rfl:  .  escitalopram (LEXAPRO) 10 MG tablet, Take 10 mg by mouth daily., Disp: , Rfl:  .  famotidine (PEPCID) 20 MG tablet, Take 20 mg by mouth daily. , Disp: , Rfl:  .  furosemide (LASIX) 20 MG tablet, , Disp: , Rfl:  .  glucose blood test strip, Use to check blood sugar 1 time per day., Disp: 100 each, Rfl: 3 .  insulin glargine (LANTUS) 100 UNIT/ML injection, Inject 14 Units into the skin at bedtime. , Disp: , Rfl:  .  insulin lispro (HUMALOG KWIKPEN) 100 UNIT/ML KiwkPen, Inject 0.07 mLs (7 Units total) into the skin 3 (three) times daily with meals. And pen needles 1/day (Patient not taking: Reported on  12/01/2018), Disp: 15 pen, Rfl: 3 .  Insulin Pen Needle 32G X 4 MM MISC, Used to inject insulin 3x daily, Disp: 270 each, Rfl: 2 .  levothyroxine (SYNTHROID) 75 MCG tablet, Take 75 mcg by mouth daily before breakfast., Disp: , Rfl:  .  lidocaine (LIDODERM) 5 %, Place 1 patch onto the skin daily. Remove & Discard patch within 12 hours or as directed by MD (Patient taking differently: Place 1-2 patches onto the skin daily as needed (pain). Remove & Discard patch within 12 hours or as directed by MD), Disp: 90 patch, Rfl: 1 .  loperamide (IMODIUM) 2 MG capsule, Take 2 mg by mouth as needed for diarrhea or loose stools., Disp: , Rfl:  .  oxyCODONE-acetaminophen (PERCOCET/ROXICET) 5-325 MG tablet, Take 1-2 tablets by mouth every 4 (four) hours as needed for severe pain. (Patient not taking: Reported on 12/01/2018), Disp: 15 tablet, Rfl: 0 .  phenytoin (DILANTIN) 100 MG ER capsule, 2 capsules at bedtime and 1 in the morning (Patient taking differently: Take 1-2 mg by mouth 2 (two) times daily. Take 1 capsule (100 mg) in the morning and Take 2 capsules (200 mg) at bedtime), Disp: 270 capsule, Rfl: 2 .  PHENYTOIN INFATABS 50 MG tablet, CHEW 1 TABLET DAILY (Patient taking differently: Chew 50 mg by mouth daily. Taking differently total at 150 mg AM and 200 MG PM), Disp: 90  tablet, Rfl: 3 .  Polyethyl Glycol-Propyl Glycol 0.4-0.3 % SOLN, Place 2 drops into both eyes 2 (two) times a day. , Disp: , Rfl:  .  primidone (MYSOLINE) 50 MG tablet, Take 2 tablets by mouth twice daily, Disp: 360 tablet, Rfl: 1 .  Probiotic Product (ALIGN) 4 MG CAPS, Take 1 capsule by mouth 3 (three) times daily after meals. Taking differently 1 cap daily, Disp: , Rfl:  .  saccharomyces boulardii (FLORASTOR) 250 MG capsule, Take 250 mg by mouth 2 (two) times daily. Taking differently 1 tab daily, Disp: , Rfl:  .  traMADol (ULTRAM) 50 MG tablet, tramadol 50 mg tablet   50 mg by oral route., Disp: , Rfl:  .  trandolapril-verapamil (TARKA) 2-240  MG tablet, Take 1 tablet by mouth daily., Disp: , Rfl:  .  verapamil (CALAN-SR) 240 MG CR tablet, verapamil ER (SR) 240 mg tablet,extended release  TAKE 1 TABLET BY MOUTH EVERY DAY, Disp: , Rfl:   Allergies  Allergen Reactions  . Bystolic [Nebivolol Hcl] Other (See Comments)    "extreme weakness, heaviness in legs & arms, swelling in legs/arms/face, swollen abdomen, pain in bladder, feet pain, soreness in chest"  . Cholestyramine     "itching rash on stomach, bloated, nausea, vomiting, sleeplessness, extreme pain in arms"  . Hydrazine Yellow [Tartrazine] Other (See Comments)    "does not reduce high blood pressure, pain in arm, high pressure, felt like I was on verge of heart attack, really weak"  . Morphine Other (See Comments)    Feels morbid, weak, still in pain  . Niacin Palpitations    Fast heart beat  . Niaspan [Niacin Er] Palpitations and Other (See Comments)    "fast heart beat, high blood pressure"  . Norvasc [Amlodipine Besylate] Other (See Comments)    "extreme fluid retention/pain)  . Optivar [Azelastine Hcl] Photosensitivity  . Repaglinide Hives  . Sular [Nisoldipine Er] Other (See Comments)    "severe headaches, swelling eyes, hands, feet, shortness of breath, weak, flushed face, brain boiling, fluid retention, high blood sugar, nervous, heart fast beating"  . Tegretol [Carbamazepine] Other (See Comments)    Blood poisoning   . Telmisartan Other (See Comments)    "headache, difficulty urinating, high blood sugar, fluid retention"  . Cefdinir Swelling    Vaginal irritation, breathing,   . Clonidine Other (See Comments)    Dry mouth, fluid retention  . Clonidine Hydrochloride Other (See Comments)    Dry mouth, fluid retention  . Codeine Nausea And Vomiting  . Ezetimibe Other (See Comments)    Made weak  . Naproxen Other (See Comments)    Shrinks bladder  . Ziac [Bisoprolol-Hydrochlorothiazide] Other (See Comments)    "stopped urination"  . Elavil [Amitriptyline]  Other (See Comments)    Gave Pt nightmares  . Hydralazine   . Iodine   . Keppra [Levetiracetam] Other (See Comments)    Shaking  . Lamictal [Lamotrigine]     itching  . Lyrica [Pregabalin] Swelling  . Pseudoephedrine   . Topamax [Topiramate]     Dry eyes  . Ace Inhibitors Other (See Comments)    unknown  . Actonel [Risedronate Sodium] Other (See Comments)    unknown  . Amlodipine Besylate Other (See Comments)    unknown  . Aspirin Other (See Comments)    unknown  . Atacand [Candesartan] Other (See Comments)    unknown  . Bextra [Valdecoxib] Other (See Comments)    unknown  . Bisoprolol-Hydrochlorothiazide Other (See Comments)  unknown  . Candesartan Cilexetil Other (See Comments)    unknown  . Cefadroxil Other (See Comments)    unknown  . Celecoxib Rash  . Hydrocodone Other (See Comments)    unknown  . Hydrocodone-Acetaminophen Other (See Comments)    unknown  . Iodinated Diagnostic Agents Rash    "All over" "All over"  . Meloxicam Other (See Comments)    unknown  . Methylprednisolone Sodium Succinate Other (See Comments)    unknown  . Nabumetone Other (See Comments)    unknown  . Penicillins Other (See Comments)    unknown  . Pseudoephedrine-Guaifenesin Other (See Comments)    unknown  . Risedronate Sodium Other (See Comments)    unknown  . Rofecoxib Other (See Comments)    unknown  . Ru-Tuss [Chlorphen-Pse-Atrop-Hyos-Scop] Other (See Comments)    unknown  . Sulfonamide Derivatives Other (See Comments)    unknown  . Sulphur [Sulfur] Other (See Comments)    unknown  . Telithromycin Other (See Comments)    unknown  . Terfenadine Other (See Comments)    unknown  . Trandolapril-Verapamil Hcl Er Other (See Comments)    Headache, difficulty urinating, high blood sugar, fluid retention  Pt is taking Tarka (trandolapril-verapamil) currently, but requests the medication stay in her allergy list  . Trandolapril-Verapamil Hcl Er Other (See Comments)     Headache, difficulty urinating, high blood sugar, fluid retention  Pt is taking Tarka (trandolapril-verapamil) currently, but requests the medication stay in her allergy list  . Valium [Diazepam] Other (See Comments)    Makes her mean and hyper    Objective: Vitals:   12/19/18 0957  Temp: 97.9 F (36.6 C)    Vascular Examination: Capillary refill time <3 seconds x 10 digits.  Dorsalis pedis pulses 1/4 b/l.  Posterior tibial pulses 1/4 b/l.  Digital hair absent x 10 digits.  Skin temperature gradient WNL b/l.  Dermatological Examination: Skin with normal turgor, texture and tone b/l.  Toenails 1-5 b/l discolored, thick, dystrophic with subungual debris and pain with palpation to nailbeds due to thickness of nails.  Hyperkeratotic lesions submet head 1 b/l. Measures 1.5 x 1.5 cm. Noted subdermal hemorrhage. No erythema, no edema, no drainage, no flocculence noted.   Hyperkeratotic lesions noted distal tip right 2nd, 3rd digits. No erythema, no edema, no drainage, no flocculence noted.  Musculoskeletal: Muscle strength 5/5 to all LE muscle groups.  Hammertoe deformity b/l 2-5.   Neurological: Sensation diminished b/l with 10 gram monofilament.  Vibratory sensation diminished b/l.  Assessment: 1. Painful onychomycosis toenails 1-5 b/l 2.   Partial thickness ulceration submet head 1 b/l 3.  Corn right 2nd, 3rd digits 4.  NIDDM with peripheral neuropathy  Plan: 1. Continue diabetic foot care principles. Literature dispensed on today. 2. Toenails 1-5 b/l were debrided in length and girth without iatrogenic bleeding. 3. Partial thickness ulcerations debrided to level of dermis. Cleansed with alcohol and triple antibiotic ointment and bandaid applied. [place] remove dressing on tomorrow. Call office if she has any problems. 4. Hyperkeratotic lesion(s) right 2nd and 3rd digits pared with sterile scalpel blade without incident. 5. Patient to continue soft, supportive shoe gear  daily. 6. Patient to report any pedal injuries to medical professional immediately. 7. Follow up 6 weeks for recheck of submet head 1 lesions.  8. Patient/POA to call should there be a concern in the interim.

## 2018-12-28 NOTE — Progress Notes (Signed)
TOC CM received referral for pt with falls at home. Jonnie Finner RN CCM [phone]

## 2018-12-28 NOTE — ED Triage Notes (Signed)
Pt states she fell backwards in her kitchen floor ~2pm-pain to back of head, neck and left UE-pt took tramadol and xanax PTA-NAD-steady gait with own walker

## 2018-12-28 NOTE — ED Notes (Addendum)
Voicemail left for case manager.

## 2018-12-29 ENCOUNTER — Other Ambulatory Visit: Payer: Self-pay | Admitting: *Deleted

## 2018-12-29 NOTE — Patient Outreach (Signed)
[facility]) Care Management  12/29/2018  [name] May 17, 1929 [id number]    Follow up post ED visit 8/26-FALL  RN attempted outreach call today however unsuccessful and RN unable to leave a voice message.  Will continue attempts to reach pt for follow up. Note ED related to FALL.  Raina Mina, RN Care Management Coordinator [place] Office [phone]

## 2019-01-02 ENCOUNTER — Ambulatory Visit: Payer: Self-pay | Admitting: *Deleted

## 2019-01-02 DIAGNOSIS — M25562 Pain in left knee: Secondary | ICD-10-CM | POA: Diagnosis not present

## 2019-01-04 ENCOUNTER — Other Ambulatory Visit: Payer: Self-pay | Admitting: *Deleted

## 2019-01-04 NOTE — Patient Outreach (Signed)
[name]) Care Management  01/04/2019  [name] [birth year] [id number]    Transition of Care (Falls)  RN spoke with pt today and receive an update on pt's ongoing management of care. Pt reports she recent had a fall and underwent a scan that was clear with no injuries. Pt reports she no longer has a cast or immobilizer and continue to work with her out-patient therapist. States she has had two appointments. States she continue to use her walker and gets on the stationary bike twice weekly. Pt has not been able to complete to recommended exercises form her therapist. RN inquired on if she could do additional time on the bike to continue building her strength. Pt states she can try as discussed an additional 15 minutes on an additional day during the week. RN verified pt is taking all er medications with no problems or delays. Plan of care discussed with updates and goals added for pt's ongoing management of care. Will adjust interventions accordingly based upon pt's progress.   Will follow up in one month based upon the additional goal related to strengthening her balance with use of her stationary bike.  THN CM Care Plan Problem One     Most Recent Value  Care Plan Problem One  Impaired safety-Falls related to balance and difficulty with gait  Role Documenting the Problem One  Care Management [place] for Problem One  Active  THN Long Term Goal   Pt will demonstrate use of safety measures to prevent falls within the next 90 days.  THN Long Term Goal Start Date  12/01/18  Interventions for Problem One Long Term Goal  Will reiterate on safety due to a new fall reported with no injuries. Will strongly encouraged pt to continue to use her DME as recommended by her therapy. Will continue to encouraged pt to work with her therapist outpt for strenghtening her endurance for maintaining her balance.  THN CM Short Term Goal #1   Adherence with post-op SNF medications  within the next 30 days.  THN CM Short Term Goal #1 Start Date  12/01/18  THN CM Short Term Goal #1 Met Date  01/04/19  THN CM Short Term Goal #2   Pt will increase her exercise regimen within the next 30 days.  THN CM Short Term Goal #2 Start Date  01/04/19  Interventions for Short Term Goal #2  Will discuss pt adding an additional 15 mins of exercise on an additional day during the week to increase her balance and strength      Raina Mina, RN Care Management Coordinator [place] Office [phone]

## 2019-01-10 DIAGNOSIS — M25562 Pain in left knee: Secondary | ICD-10-CM | POA: Diagnosis not present

## 2019-01-12 DIAGNOSIS — M25572 Pain in left ankle and joints of left foot: Secondary | ICD-10-CM | POA: Diagnosis not present

## 2019-01-12 DIAGNOSIS — E1165 Type 2 diabetes mellitus with hyperglycemia: Secondary | ICD-10-CM | POA: Diagnosis not present

## 2019-01-12 DIAGNOSIS — M1712 Unilateral primary osteoarthritis, left knee: Secondary | ICD-10-CM | POA: Diagnosis not present

## 2019-01-12 DIAGNOSIS — S82035D Nondisplaced transverse fracture of left patella, subsequent encounter for closed fracture with routine healing: Secondary | ICD-10-CM | POA: Diagnosis not present

## 2019-01-16 DIAGNOSIS — M25562 Pain in left knee: Secondary | ICD-10-CM | POA: Diagnosis not present

## 2019-01-17 DIAGNOSIS — R159 Full incontinence of feces: Secondary | ICD-10-CM | POA: Diagnosis not present

## 2019-01-17 DIAGNOSIS — K580 Irritable bowel syndrome with diarrhea: Secondary | ICD-10-CM | POA: Diagnosis not present

## 2019-01-23 DIAGNOSIS — S82002D Unspecified fracture of left patella, subsequent encounter for closed fracture with routine healing: Secondary | ICD-10-CM | POA: Diagnosis not present

## 2019-01-23 DIAGNOSIS — M1712 Unilateral primary osteoarthritis, left knee: Secondary | ICD-10-CM | POA: Diagnosis not present

## 2019-01-24 DIAGNOSIS — H26492 Other secondary cataract, left eye: Secondary | ICD-10-CM | POA: Diagnosis not present

## 2019-01-24 DIAGNOSIS — M25562 Pain in left knee: Secondary | ICD-10-CM | POA: Diagnosis not present

## 2019-01-30 ENCOUNTER — Encounter: Payer: Self-pay | Admitting: Podiatry

## 2019-01-30 ENCOUNTER — Other Ambulatory Visit: Payer: Self-pay

## 2019-01-30 ENCOUNTER — Ambulatory Visit (INDEPENDENT_AMBULATORY_CARE_PROVIDER_SITE_OTHER): Payer: Medicare Other | Admitting: Podiatry

## 2019-01-30 DIAGNOSIS — M79674 Pain in right toe(s): Secondary | ICD-10-CM

## 2019-01-30 DIAGNOSIS — E11621 Type 2 diabetes mellitus with foot ulcer: Secondary | ICD-10-CM

## 2019-01-30 DIAGNOSIS — L97421 Non-pressure chronic ulcer of left heel and midfoot limited to breakdown of skin: Secondary | ICD-10-CM

## 2019-01-30 DIAGNOSIS — E1142 Type 2 diabetes mellitus with diabetic polyneuropathy: Secondary | ICD-10-CM

## 2019-01-30 DIAGNOSIS — M79675 Pain in left toe(s): Secondary | ICD-10-CM | POA: Diagnosis not present

## 2019-01-30 DIAGNOSIS — B351 Tinea unguium: Secondary | ICD-10-CM | POA: Diagnosis not present

## 2019-01-30 NOTE — Patient Instructions (Signed)
Diabetes Mellitus and Foot Care Foot care is an important part of your health, especially when you have diabetes. Diabetes [place] cause you to have problems because of poor blood flow (circulation) to your feet and legs, which can cause your skin to:  Become thinner and drier.  Break more easily.  Heal more slowly.  Peel and crack. You [place] also have nerve damage (neuropathy) in your legs and feet, causing decreased feeling in them. This means that you [place] not notice minor injuries to your feet that could lead to more serious problems. Noticing and addressing any potential problems early is the best way to prevent future foot problems. How to care for your feet Foot hygiene  Wash your feet daily with warm water and mild soap. Do not use hot water. Then, pat your feet and the areas between your toes until they are completely dry. Do not soak your feet as this can dry your skin.  Trim your toenails straight across. Do not dig under them or around the cuticle. File the edges of your nails with an emery board or nail file.  Apply a moisturizing lotion or petroleum jelly to the skin on your feet and to dry, brittle toenails. Use lotion that does not contain alcohol and is unscented. Do not apply lotion between your toes. Shoes and socks  Wear clean socks or stockings every day. Make sure they are not too tight. Do not wear knee-high stockings since they [place] decrease blood flow to your legs.  Wear shoes that fit properly and have enough cushioning. Always look in your shoes before you put them on to be sure there are no objects inside.  To break in new shoes, wear them for just a few hours a day. This prevents injuries on your feet. Wounds, scrapes, corns, and calluses  Check your feet daily for blisters, cuts, bruises, sores, and redness. If you cannot see the bottom of your feet, use a mirror or ask someone for help.  Do not cut corns or calluses or try to remove them with medicine.  If you  find a minor scrape, cut, or break in the skin on your feet, keep it and the skin around it clean and dry. You Albo clean these areas with mild soap and water. Do not clean the area with peroxide, alcohol, or iodine.  If you have a wound, scrape, corn, or callus on your foot, look at it several times a day to make sure it is healing and not infected. Check for: ? Redness, swelling, or pain. ? Fluid or blood. ? Warmth. ? Pus or a bad smell. General instructions  Do not cross your legs. This [place] decrease blood flow to your feet.  Do not use heating pads or hot water bottles on your feet. They Nin burn your skin. If you have lost feeling in your feet or legs, you Werntz not know this is happening until it is too late.  Protect your feet from hot and cold by wearing shoes, such as at the beach or on hot pavement.  Schedule a complete foot exam at least once a year (annually) or more often if you have foot problems. If you have foot problems, report any cuts, sores, or bruises to your health care provider immediately. Contact a health care provider if:  You have a medical condition that increases your risk of infection and you have any cuts, sores, or bruises on your feet.  You have an injury that is not   healing.  You have redness on your legs or feet.  You feel burning or tingling in your legs or feet.  You have pain or cramps in your legs and feet.  Your legs or feet are numb.  Your feet always feel cold.  You have pain around a toenail. Get help right away if:  You have a wound, scrape, corn, or callus on your foot and: ? You have pain, swelling, or redness that gets worse. ? You have fluid or blood coming from the wound, scrape, corn, or callus. ? Your wound, scrape, corn, or callus feels warm to the touch. ? You have pus or a bad smell coming from the wound, scrape, corn, or callus. ? You have a fever. ? You have a red line going up your leg. Summary  Check your feet every day  for cuts, sores, red spots, swelling, and blisters.  Moisturize feet and legs daily.  Wear shoes that fit properly and have enough cushioning.  If you have foot problems, report any cuts, sores, or bruises to your health care provider immediately.  Schedule a complete foot exam at least once a year (annually) or more often if you have foot problems. This information is not intended to replace advice given to you by your health care provider. Make sure you discuss any questions you have with your health care provider. Document Released: 04/17/2000 Document Revised: 06/02/2017 Document Reviewed: 05/22/2016 Elsevier Patient Education  2020 Else[employer]  Corns and Calluses Corns are small areas of thickened skin that occur on the top, sides, or tip of a toe. They contain a cone-shaped core with a point that can press on a nerve below. This causes pain.  Calluses are areas of thickened skin that can occur anywhere on the body, including the hands, fingers, palms, soles of the feet, and heels. Calluses are usually larger than corns. What are the causes? Corns and calluses are caused by rubbing (friction) or pressure, such as from shoes that are too tight or do not fit properly. What increases the risk? Corns are more likely to develop in people who have misshapen toes (toe deformities), such as hammer toes. Calluses can occur with friction to any area of the skin. They are more likely to develop in people who:  Work with their hands.  Wear shoes that fit poorly, are too tight, or are high-heeled.  Have toe deformities. What are the signs or symptoms? Symptoms of a corn or callus include:  A hard growth on the skin.  Pain or tenderness under the skin.  Redness and swelling.  Increased discomfort while wearing tight-fitting shoes, if your feet are affected. If a corn or callus becomes infected, symptoms [place] include:  Redness and swelling that gets worse.  Pain.  Fluid, blood, or  pus draining from the corn or callus. How is this diagnosed? Corns and calluses [place] be diagnosed based on your symptoms, your medical history, and a physical exam. How is this treated? Treatment for corns and calluses [place] include:  Removing the cause of the friction or pressure. This Bold involve: ? Changing your shoes. ? Wearing shoe inserts (orthotics) or other protective layers in your shoes, such as a corn pad. ? Wearing gloves.  Applying medicine to the skin (topical medicine) to help soften skin in the hardened, thickened areas.  Removing layers of dead skin with a file to reduce the size of the corn or callus.  Removing the corn or callus with a scalpel or   laser.  Taking antibiotic medicines, if your corn or callus is infected.  Having surgery, if a toe deformity is the cause. Follow these instructions at home:   Take over-the-counter and prescription medicines only as told by your health care provider.  If you were prescribed an antibiotic, take it as told by your health care provider. Do not stop taking it even if your condition starts to improve.  Wear shoes that fit well. Avoid wearing high-heeled shoes and shoes that are too tight or too loose.  Wear any padding, protective layers, gloves, or orthotics as told by your health care provider.  Soak your hands or feet and then use a file or pumice stone to soften your corn or callus. Do this as told by your health care provider.  Check your corn or callus every day for symptoms of infection. Contact a health care provider if you:  Notice that your symptoms do not improve with treatment.  Have redness or swelling that gets worse.  Notice that your corn or callus becomes painful.  Have fluid, blood, or pus coming from your corn or callus.  Have new symptoms. Summary  Corns are small areas of thickened skin that occur on the top, sides, or tip of a toe.  Calluses are areas of thickened skin that can occur anywhere  on the body, including the hands, fingers, palms, and soles of the feet. Calluses are usually larger than corns.  Corns and calluses are caused by rubbing (friction) or pressure, such as from shoes that are too tight or do not fit properly.  Treatment [place] include wearing any padding, protective layers, gloves, or orthotics as told by your health care provider. This information is not intended to replace advice given to you by your health care provider. Make sure you discuss any questions you have with your health care provider. Document Released: 01/25/2004 Document Revised: 08/10/2018 Document Reviewed: 03/03/2017 Elsevier Patient Education  2020 Elsevier Inc.  

## 2019-01-31 DIAGNOSIS — M25562 Pain in left knee: Secondary | ICD-10-CM | POA: Diagnosis not present

## 2019-02-01 NOTE — Progress Notes (Signed)
Subjective: Patient presents today with diabetes and cc of painful, discolored, thick toenails which interfere with daily activities. Pain is aggravated when wearing enclosed shoe gear. Pain is getting progressively worse and relieved with periodic professional debridement.  Yolanda Bien, MD is her PCP.   Current Outpatient Medications on File Prior to Visit  Medication Sig Dispense Refill  . ALPRAZolam (XANAX) 0.25 MG tablet alprazolam 0.25 mg tablet   0.25 mg by oral route.    . Biotin 1000 MCG tablet Take 1,000 mcg by mouth daily.     . Blood Glucose Monitoring Suppl (PRODIGY VOICE BLOOD GLUCOSE) w/Device KIT Use to check blood sugar 1 time per day. 1 each 2  . calcitonin, salmon, (MIACALCIN/FORTICAL) 200 UNIT/ACT nasal spray     . Cholecalciferol 5000 units capsule Take 10,000 Units by mouth 2 (two) times a day.     . cholestyramine (QUESTRAN) 4 g packet     . cyanocobalamin (,VITAMIN B-12,) 1000 MCG/ML injection INJECT 1 ML INTRAMUSCULARLY EVERY 21 DAYS (Patient taking differently: Inject 1,000 mcg into the muscle every 21 ( twenty-one) days. ) 3 mL 2  . DiphenhydrAMINE HCl (BENADRYL ALLERGY PO) Take 1 tablet by mouth as needed.    . empagliflozin (JARDIANCE) 10 MG TABS tablet Take 10 mg by mouth daily.    Marland Kitchen escitalopram (LEXAPRO) 10 MG tablet Take 10 mg by mouth daily.    . famotidine (PEPCID) 20 MG tablet Take 20 mg by mouth daily.     . furosemide (LASIX) 20 MG tablet     . glucose blood test strip Use to check blood sugar 1 time per day. 100 each 3  . insulin glargine (LANTUS) 100 UNIT/ML injection Inject 14 Units into the skin at bedtime.     . insulin lispro (HUMALOG KWIKPEN) 100 UNIT/ML KiwkPen Inject 0.07 mLs (7 Units total) into the skin 3 (three) times daily with meals. And pen needles 1/day (Patient not taking: Reported on 12/01/2018) 15 pen 3  . Insulin Pen Needle 32G X 4 MM MISC Used to inject insulin 3x daily 270 each 2  . levothyroxine (SYNTHROID) 75 MCG tablet Take  75 mcg by mouth daily before breakfast.    . lidocaine (LIDODERM) 5 % Place 1 patch onto the skin daily. Remove & Discard patch within 12 hours or as directed by MD (Patient taking differently: Place 1-2 patches onto the skin daily as needed (pain). Remove & Discard patch within 12 hours or as directed by MD) 90 patch 1  . loperamide (IMODIUM) 2 MG capsule Take 2 mg by mouth as needed for diarrhea or loose stools.    Marland Kitchen oxyCODONE-acetaminophen (PERCOCET/ROXICET) 5-325 MG tablet Take 1-2 tablets by mouth every 4 (four) hours as needed for severe pain. (Patient not taking: Reported on 12/01/2018) 15 tablet 0  . phenytoin (DILANTIN) 100 MG ER capsule 2 capsules at bedtime and 1 in the morning (Patient taking differently: Take 1-2 mg by mouth 2 (two) times daily. Take 1 capsule (100 mg) in the morning and Take 2 capsules (200 mg) at bedtime) 270 capsule 2  . PHENYTOIN INFATABS 50 MG tablet CHEW 1 TABLET DAILY (Patient taking differently: Chew 50 mg by mouth daily. Taking differently total at 150 mg AM and 200 MG PM) 90 tablet 3  . Polyethyl Glycol-Propyl Glycol 0.4-0.3 % SOLN Place 2 drops into both eyes 2 (two) times a day.     . primidone (MYSOLINE) 50 MG tablet Take 2 tablets by mouth twice daily 360  tablet 1  . Probiotic Product (ALIGN) 4 MG CAPS Take 1 capsule by mouth 3 (three) times daily after meals. Taking differently 1 cap daily    . saccharomyces boulardii (FLORASTOR) 250 MG capsule Take 250 mg by mouth 2 (two) times daily. Taking differently 1 tab daily    . traMADol (ULTRAM) 50 MG tablet tramadol 50 mg tablet   50 mg by oral route.    . trandolapril-verapamil (TARKA) 2-240 MG tablet Take 1 tablet by mouth daily.    . verapamil (CALAN-SR) 240 MG CR tablet verapamil ER (SR) 240 mg tablet,extended release  TAKE 1 TABLET BY MOUTH EVERY DAY     No current facility-administered medications on file prior to visit.      Allergies  Allergen Reactions  . Bystolic [Nebivolol Hcl] Other (See  Comments)    "extreme weakness, heaviness in legs & arms, swelling in legs/arms/face, swollen abdomen, pain in bladder, feet pain, soreness in chest"  . Cholestyramine     "itching rash on stomach, bloated, nausea, vomiting, sleeplessness, extreme pain in arms"  . Hydrazine Yellow [Tartrazine] Other (See Comments)    "does not reduce high blood pressure, pain in arm, high pressure, felt like I was on verge of heart attack, really weak"  . Morphine Other (See Comments)    Feels morbid, weak, still in pain  . Niacin Palpitations    Fast heart beat  . Niaspan [Niacin Er] Palpitations and Other (See Comments)    "fast heart beat, high blood pressure"  . Norvasc [Amlodipine Besylate] Other (See Comments)    "extreme fluid retention/pain)  . Optivar [Azelastine Hcl] Photosensitivity  . Repaglinide Hives  . Sular [Nisoldipine Er] Other (See Comments)    "severe headaches, swelling eyes, hands, feet, shortness of breath, weak, flushed face, brain boiling, fluid retention, high blood sugar, nervous, heart fast beating"  . Tegretol [Carbamazepine] Other (See Comments)    Blood poisoning   . Telmisartan Other (See Comments)    "headache, difficulty urinating, high blood sugar, fluid retention"  . Cefdinir Swelling    Vaginal irritation, breathing,   . Clonidine Other (See Comments)    Dry mouth, fluid retention  . Clonidine Hydrochloride Other (See Comments)    Dry mouth, fluid retention  . Codeine Nausea And Vomiting  . Ezetimibe Other (See Comments)    Made weak  . Naproxen Other (See Comments)    Shrinks bladder  . Ziac [Bisoprolol-Hydrochlorothiazide] Other (See Comments)    "stopped urination"  . Elavil [Amitriptyline] Other (See Comments)    Gave Pt nightmares  . Hydralazine   . Iodine   . Keppra [Levetiracetam] Other (See Comments)    Shaking  . Lamictal [Lamotrigine]     itching  . Lyrica [Pregabalin] Swelling  . Pseudoephedrine   . Topamax [Topiramate]     Dry eyes  .  Ace Inhibitors Other (See Comments)    unknown  . Actonel [Risedronate Sodium] Other (See Comments)    unknown  . Amlodipine Besylate Other (See Comments)    unknown  . Aspirin Other (See Comments)    unknown  . Atacand [Candesartan] Other (See Comments)    unknown  . Bextra [Valdecoxib] Other (See Comments)    unknown  . Bisoprolol-Hydrochlorothiazide Other (See Comments)    unknown  . Candesartan Cilexetil Other (See Comments)    unknown  . Cefadroxil Other (See Comments)    unknown  . Celecoxib Rash  . Hydrocodone Other (See Comments)    unknown  .  Hydrocodone-Acetaminophen Other (See Comments)    unknown  . Iodinated Diagnostic Agents Rash    "All over" "All over"  . Meloxicam Other (See Comments)    unknown  . Methylprednisolone Sodium Succinate Other (See Comments)    unknown  . Nabumetone Other (See Comments)    unknown  . Penicillins Other (See Comments)    unknown  . Pseudoephedrine-Guaifenesin Other (See Comments)    unknown  . Risedronate Sodium Other (See Comments)    unknown  . Rofecoxib Other (See Comments)    unknown  . Ru-Tuss [Chlorphen-Pse-Atrop-Hyos-Scop] Other (See Comments)    unknown  . Sulfonamide Derivatives Other (See Comments)    unknown  . Sulphur [Sulfur] Other (See Comments)    unknown  . Telithromycin Other (See Comments)    unknown  . Terfenadine Other (See Comments)    unknown  . Trandolapril-Verapamil Hcl Er Other (See Comments)    Headache, difficulty urinating, high blood sugar, fluid retention  Pt is taking Tarka (trandolapril-verapamil) currently, but requests the medication stay in her allergy list  . Trandolapril-Verapamil Hcl Er Other (See Comments)    Headache, difficulty urinating, high blood sugar, fluid retention  Pt is taking Tarka (trandolapril-verapamil) currently, but requests the medication stay in her allergy list  . Valium [Diazepam] Other (See Comments)    Makes her mean and hyper     Objective: There  were no vitals filed for this visit.  Vascular Examination: Capillary refill time  Subjective: [name] presents today with painful call uses and, thick toenails 1-5 b/l that she cannot cut and which interfere with daily activities.  Pain is aggravated when wearing enclosed shoe gear.  Yolanda Bien, MD is her PCP.   Current Outpatient Medications on File Prior to Visit  Medication Sig Dispense Refill  . ALPRAZolam (XANAX) 0.25 MG tablet alprazolam 0.25 mg tablet   0.25 mg by oral route.    . Biotin 1000 MCG tablet Take 1,000 mcg by mouth daily.     . Blood Glucose Monitoring Suppl (PRODIGY VOICE BLOOD GLUCOSE) w/Device KIT Use to check blood sugar 1 time per day. 1 each 2  . calcitonin, salmon, (MIACALCIN/FORTICAL) 200 UNIT/ACT nasal spray     . Cholecalciferol 5000 units capsule Take 10,000 Units by mouth 2 (two) times a day.     . cholestyramine (QUESTRAN) 4 g packet     . cyanocobalamin (,VITAMIN B-12,) 1000 MCG/ML injection INJECT 1 ML INTRAMUSCULARLY EVERY 21 DAYS (Patient taking differently: Inject 1,000 mcg into the muscle every 21 ( twenty-one) days. ) 3 mL 2  . DiphenhydrAMINE HCl (BENADRYL ALLERGY PO) Take 1 tablet by mouth as needed.    . empagliflozin (JARDIANCE) 10 MG TABS tablet Take 10 mg by mouth daily.    Marland Kitchen escitalopram (LEXAPRO) 10 MG tablet Take 10 mg by mouth daily.    . famotidine (PEPCID) 20 MG tablet Take 20 mg by mouth daily.     . furosemide (LASIX) 20 MG tablet     . glucose blood test strip Use to check blood sugar 1 time per day. 100 each 3  . insulin glargine (LANTUS) 100 UNIT/ML injection Inject 14 Units into the skin at bedtime.     . insulin lispro (HUMALOG KWIKPEN) 100 UNIT/ML KiwkPen Inject 0.07 mLs (7 Units total) into the skin 3 (three) times daily with meals. And pen needles 1/day (Patient not taking: Reported on 12/01/2018) 15 pen 3  . Insulin Pen Needle 32G X 4 MM MISC  Used to inject insulin 3x daily 270 each 2  . levothyroxine (SYNTHROID) 75  MCG tablet Take 75 mcg by mouth daily before breakfast.    . lidocaine (LIDODERM) 5 % Place 1 patch onto the skin daily. Remove & Discard patch within 12 hours or as directed by MD (Patient taking differently: Place 1-2 patches onto the skin daily as needed (pain). Remove & Discard patch within 12 hours or as directed by MD) 90 patch 1  . loperamide (IMODIUM) 2 MG capsule Take 2 mg by mouth as needed for diarrhea or loose stools.    Marland Kitchen oxyCODONE-acetaminophen (PERCOCET/ROXICET) 5-325 MG tablet Take 1-2 tablets by mouth every 4 (four) hours as needed for severe pain. (Patient not taking: Reported on 12/01/2018) 15 tablet 0  . phenytoin (DILANTIN) 100 MG ER capsule 2 capsules at bedtime and 1 in the morning (Patient taking differently: Take 1-2 mg by mouth 2 (two) times daily. Take 1 capsule (100 mg) in the morning and Take 2 capsules (200 mg) at bedtime) 270 capsule 2  . PHENYTOIN INFATABS 50 MG tablet CHEW 1 TABLET DAILY (Patient taking differently: Chew 50 mg by mouth daily. Taking differently total at 150 mg AM and 200 MG PM) 90 tablet 3  . Polyethyl Glycol-Propyl Glycol 0.4-0.3 % SOLN Place 2 drops into both eyes 2 (two) times a day.     . primidone (MYSOLINE) 50 MG tablet Take 2 tablets by mouth twice daily 360 tablet 1  . Probiotic Product (ALIGN) 4 MG CAPS Take 1 capsule by mouth 3 (three) times daily after meals. Taking differently 1 cap daily    . saccharomyces boulardii (FLORASTOR) 250 MG capsule Take 250 mg by mouth 2 (two) times daily. Taking differently 1 tab daily    . traMADol (ULTRAM) 50 MG tablet tramadol 50 mg tablet   50 mg by oral route.    . trandolapril-verapamil (TARKA) 2-240 MG tablet Take 1 tablet by mouth daily.    . verapamil (CALAN-SR) 240 MG CR tablet verapamil ER (SR) 240 mg tablet,extended release  TAKE 1 TABLET BY MOUTH EVERY DAY     No current facility-administered medications on file prior to visit.     Allergies  Allergen Reactions  . Bystolic [Nebivolol Hcl]  Other (See Comments)    "extreme weakness, heaviness in legs & arms, swelling in legs/arms/face, swollen abdomen, pain in bladder, feet pain, soreness in chest"  . Cholestyramine     "itching rash on stomach, bloated, nausea, vomiting, sleeplessness, extreme pain in arms"  . Hydrazine Yellow [Tartrazine] Other (See Comments)    "does not reduce high blood pressure, pain in arm, high pressure, felt like I was on verge of heart attack, really weak"  . Morphine Other (See Comments)    Feels morbid, weak, still in pain  . Niacin Palpitations    Fast heart beat  . Niaspan [Niacin Er] Palpitations and Other (See Comments)    "fast heart beat, high blood pressure"  . Norvasc [Amlodipine Besylate] Other (See Comments)    "extreme fluid retention/pain)  . Optivar [Azelastine Hcl] Photosensitivity  . Repaglinide Hives  . Sular [Nisoldipine Er] Other (See Comments)    "severe headaches, swelling eyes, hands, feet, shortness of breath, weak, flushed face, brain boiling, fluid retention, high blood sugar, nervous, heart fast beating"  . Tegretol [Carbamazepine] Other (See Comments)    Blood poisoning   . Telmisartan Other (See Comments)    "headache, difficulty urinating, high blood sugar, fluid retention"  . Cefdinir  Swelling    Vaginal irritation, breathing,   . Clonidine Other (See Comments)    Dry mouth, fluid retention  . Clonidine Hydrochloride Other (See Comments)    Dry mouth, fluid retention  . Codeine Nausea And Vomiting  . Ezetimibe Other (See Comments)    Made weak  . Naproxen Other (See Comments)    Shrinks bladder  . Ziac [Bisoprolol-Hydrochlorothiazide] Other (See Comments)    "stopped urination"  . Elavil [Amitriptyline] Other (See Comments)    Gave Pt nightmares  . Hydralazine   . Iodine   . Keppra [Levetiracetam] Other (See Comments)    Shaking  . Lamictal [Lamotrigine]     itching  . Lyrica [Pregabalin] Swelling  . Pseudoephedrine   . Topamax [Topiramate]     Dry  eyes  . Ace Inhibitors Other (See Comments)    unknown  . Actonel [Risedronate Sodium] Other (See Comments)    unknown  . Amlodipine Besylate Other (See Comments)    unknown  . Aspirin Other (See Comments)    unknown  . Atacand [Candesartan] Other (See Comments)    unknown  . Bextra [Valdecoxib] Other (See Comments)    unknown  . Bisoprolol-Hydrochlorothiazide Other (See Comments)    unknown  . Candesartan Cilexetil Other (See Comments)    unknown  . Cefadroxil Other (See Comments)    unknown  . Celecoxib Rash  . Hydrocodone Other (See Comments)    unknown  . Hydrocodone-Acetaminophen Other (See Comments)    unknown  . Iodinated Diagnostic Agents Rash    "All over" "All over"  . Meloxicam Other (See Comments)    unknown  . Methylprednisolone Sodium Succinate Other (See Comments)    unknown  . Nabumetone Other (See Comments)    unknown  . Penicillins Other (See Comments)    unknown  . Pseudoephedrine-Guaifenesin Other (See Comments)    unknown  . Risedronate Sodium Other (See Comments)    unknown  . Rofecoxib Other (See Comments)    unknown  . Ru-Tuss [Chlorphen-Pse-Atrop-Hyos-Scop] Other (See Comments)    unknown  . Sulfonamide Derivatives Other (See Comments)    unknown  . Sulphur [Sulfur] Other (See Comments)    unknown  . Telithromycin Other (See Comments)    unknown  . Terfenadine Other (See Comments)    unknown  . Trandolapril-Verapamil Hcl Er Other (See Comments)    Headache, difficulty urinating, high blood sugar, fluid retention  Pt is taking Tarka (trandolapril-verapamil) currently, but requests the medication stay in her allergy list  . Trandolapril-Verapamil Hcl Er Other (See Comments)    Headache, difficulty urinating, high blood sugar, fluid retention  Pt is taking Tarka (trandolapril-verapamil) currently, but requests the medication stay in her allergy list  . Valium [Diazepam] Other (See Comments)    Makes her mean and hyper     Objective: There were no vitals filed for this visit.  Vascular Examination: Capillary refill time <3 seconds x 10 digits.  Dorsalis pedis and Posterior tibial pulses 1/4 b/l.  Digital hair absent b/l.  Skin temperature gradient WNL b/l.  Dermatological Examination: Skin with normal turgor, texture and tone b/l.  Toenails 1-5 b/l discolored, thick, dystrophic with subungual debris and pain with palpation to nailbeds due to thickness of nails.  Partial thickness ulceration noted submet head 1 b/l. No erythema, no edema, no drainage. Measures 1.5 x 1.5 cm.  Musculoskeletal: Muscle strength 5/5 to all LE muscle groups.  Hammertoes 2-5 b/l.  No pain, crepitus or joint limitation noted with  ROM.   Neurological: Sensation diminished b/l  with 10 gram monofilament.  Vibratory sensation idiminished b/l.  Assessment: Painful onychomycosis toenails 1-5 b/l  Partial thickness ulceration submet head 1 b/l NIDDM with neuropathy  Plan: 1. Toenails 1-5 b/l were debrided in length and girth without iatrogenic bleeding. 2. Partial thickness ulcerations debrided submet head 1 b/l. Cleansed with alcohol. Light dressing applied.  3. Patient to continue soft, supportive shoe gear daily. 4. Patient to report any pedal injuries to medical professional immediately. 5. Follow up 6 weeks.  Patient/POA to call should there be a concern in the interim. x 10 digits.

## 2019-02-03 ENCOUNTER — Other Ambulatory Visit: Payer: Self-pay | Admitting: *Deleted

## 2019-02-03 NOTE — Patient Outreach (Signed)
[facility]) Care Management  02/03/2019  [name] [birth year] [id number]  Telephone Assessment-Preventive Measures related to Falls  RN attempted outreach call today however unsuccessful. RN able to leave a HIPAA approved voice message requesting a call back. Will continue to inquire on case management needs accordingly.  Will follow up next week with another outreach call for ongoing [facility] services.  Raina Mina, RN Care Management Coordinator [place] Office [phone]

## 2019-02-06 ENCOUNTER — Emergency Department (HOSPITAL_COMMUNITY)
Admission: EM | Admit: 2019-02-06 | Discharge: 2019-02-06 | Disposition: A | Discharge status: Home / Self Care | Payer: Medicare Other | Attending: Emergency Medicine | Admitting: Emergency Medicine

## 2019-02-06 DIAGNOSIS — R202 Paresthesia of skin: Secondary | ICD-10-CM | POA: Diagnosis not present

## 2019-02-06 DIAGNOSIS — I2510 Atherosclerotic heart disease of native coronary artery without angina pectoris: Secondary | ICD-10-CM | POA: Insufficient documentation

## 2019-02-06 DIAGNOSIS — M67812 Other specified disorders of synovium, left shoulder: Secondary | ICD-10-CM | POA: Diagnosis not present

## 2019-02-06 DIAGNOSIS — Z794 Long term (current) use of insulin: Secondary | ICD-10-CM | POA: Diagnosis not present

## 2019-02-06 DIAGNOSIS — I10 Essential (primary) hypertension: Secondary | ICD-10-CM | POA: Diagnosis not present

## 2019-02-06 DIAGNOSIS — Z79899 Other long term (current) drug therapy: Secondary | ICD-10-CM | POA: Insufficient documentation

## 2019-02-06 DIAGNOSIS — R112 Nausea with vomiting, unspecified: Secondary | ICD-10-CM | POA: Diagnosis not present

## 2019-02-06 DIAGNOSIS — R21 Rash and other nonspecific skin eruption: Secondary | ICD-10-CM | POA: Diagnosis present

## 2019-02-06 DIAGNOSIS — E119 Type 2 diabetes mellitus without complications: Secondary | ICD-10-CM | POA: Diagnosis not present

## 2019-02-06 DIAGNOSIS — R609 Edema, unspecified: Secondary | ICD-10-CM | POA: Diagnosis not present

## 2019-02-06 DIAGNOSIS — R1084 Generalized abdominal pain: Secondary | ICD-10-CM | POA: Diagnosis not present

## 2019-02-06 DIAGNOSIS — T7840XA Allergy, unspecified, initial encounter: Secondary | ICD-10-CM | POA: Insufficient documentation

## 2019-02-06 DIAGNOSIS — R52 Pain, unspecified: Secondary | ICD-10-CM | POA: Diagnosis not present

## 2019-02-06 DIAGNOSIS — M67819 Other specified disorders of synovium and tendon, unspecified shoulder: Secondary | ICD-10-CM | POA: Diagnosis not present

## 2019-02-06 DIAGNOSIS — M1712 Unilateral primary osteoarthritis, left knee: Secondary | ICD-10-CM | POA: Diagnosis not present

## 2019-02-06 DIAGNOSIS — M25512 Pain in left shoulder: Secondary | ICD-10-CM | POA: Diagnosis not present

## 2019-02-06 LAB — CBC WITH DIFFERENTIAL/PLATELET
Abs Immature Granulocytes: 0.04 10*3/uL (ref 0.00–0.07)
Basophils Absolute: 0 10*3/uL (ref 0.0–0.1)
Basophils Relative: 1 %
Eosinophils Absolute: 0 10*3/uL (ref 0.0–0.5)
Eosinophils Relative: 1 %
HCT: 36.6 % (ref 36.0–46.0)
Hemoglobin: 12.2 g/dL (ref 12.0–15.0)
Immature Granulocytes: 1 %
Lymphocytes Relative: 11 %
Lymphs Abs: 0.9 10*3/uL (ref 0.7–4.0)
MCH: 32 pg (ref 26.0–34.0)
MCHC: 33.3 g/dL (ref 30.0–36.0)
MCV: 96.1 fL (ref 80.0–100.0)
Monocytes Absolute: 0.3 10*3/uL (ref 0.1–1.0)
Monocytes Relative: 4 %
Neutro Abs: 7.1 10*3/uL (ref 1.7–7.7)
Neutrophils Relative %: 82 %
Platelets: 288 10*3/uL (ref 150–400)
RBC: 3.81 MIL/uL — ABNORMAL LOW (ref 3.87–5.11)
RDW: 15 % (ref 11.5–15.5)
WBC: 8.5 10*3/uL (ref 4.0–10.5)
nRBC: 0 % (ref 0.0–0.2)

## 2019-02-06 LAB — COMPREHENSIVE METABOLIC PANEL
ALT: 30 U/L (ref 0–44)
AST: 28 U/L (ref 15–41)
Albumin: 3.4 g/dL — ABNORMAL LOW (ref 3.5–5.0)
Alkaline Phosphatase: 78 U/L (ref 38–126)
Anion gap: 9 (ref 5–15)
BUN: 15 mg/dL (ref 8–23)
CO2: 24 mmol/L (ref 22–32)
Calcium: 8.2 mg/dL — ABNORMAL LOW (ref 8.9–10.3)
Chloride: 103 mmol/L (ref 98–111)
Creatinine, Ser: 0.83 mg/dL (ref 0.44–1.00)
GFR calc Af Amer: >60 mL/min (ref >60)
GFR calc non Af Amer: >60 mL/min (ref >60)
Glucose, Bld: 199 mg/dL — ABNORMAL HIGH (ref 70–99)
Potassium: 4.4 mmol/L (ref 3.5–5.1)
Sodium: 136 mmol/L (ref 135–145)
Total Bilirubin: 0.1 mg/dL — ABNORMAL LOW (ref 0.3–1.2)
Total Protein: 6 g/dL — ABNORMAL LOW (ref 6.5–8.1)

## 2019-02-06 MED ORDER — EPINEPHRINE 0.3 MG/0.3ML IJ SOAJ: 0.3000 mg | INTRAMUSCULAR | 0 refills | Status: DC | PRN

## 2019-02-06 NOTE — ED Provider Notes (Signed)
[facility] EMERGENCY DEPARTMENT Provider Note   CSN: [id number] Arrival date & time: 02/06/19  1501     History   Chief Complaint Chief Complaint  Patient presents with  . Allergic Reaction    HPI [name] is a 85 y.o. female.     85 yo F with a chief complaints of diffuse rash nausea and vomiting.  This occurred after she had 2 injections done at the orthopedic doctor's office today.  Had a Synvisc shot to her left knee and a cortisone shot to her left shoulder.  She is never had a reaction like this before to 1 of those medicines.  She also ate lunch prior to the event.  Denies any prior reaction like this before.  She called 911 and was picked up by the ambulance.  Was given Benadryl and Zofran with improvement.  She waited in the waiting room for 4 hours.  Currently she has no symptoms.  The history is provided by the patient.  Allergic Reaction Presenting symptoms: difficulty swallowing   Presenting symptoms: no wheezing   Illness Severity:  Severe Onset quality:  Sudden Duration:  5 hours Timing:  Constant Progression:  Unchanged Chronicity:  New Associated symptoms: diarrhea, nausea and vomiting   Associated symptoms: no chest pain, no congestion, no fever, no headaches, no myalgias, no rhinorrhea, no shortness of breath and no wheezing     Past Medical History:  Diagnosis Date  . Anemia   . Anxiety   . Breast cancer ([place]) 2014   right breast  . Cancer of right breast ([place]) 12/26/12   right breast 12:00 o'clock, DCIS  . Chronic cough   . Chronic facial pain   . Chronic foot pain   . Complication of anesthesia    Sore jaw; could not chew or move mouth  . Convulsions/seizures ([place]) 10/16/2014  . Diabetes mellitus    type 2 niddm x 20 years  . Dyslipidemia   . Ejection fraction   . GERD (gastroesophageal reflux disease)   . Hammer toe    bilateral  . History of colonic polyps   . HTN (hypertension)   . Hx of radiation therapy 03/07/13-  03/29/13   right breast 4250 cGy 17 sessions  . Hyperlipidemia   . Melanoma ([place])   . Metatarsal bone fracture 2014  . Multiple drug allergies   . Obesity   . Osteoarthritis   . Palpitations   . Personal history of radiation therapy 2014  . Seasonal allergies   . Skin cancer   . Tremor, essential 08/18/2016  . Vitamin B12 deficiency     Patient Active Problem List   Diagnosis Date Noted  . Hypokalemia   . Effusion of left knee joint   . Hyponatremia   . Left patella fracture 10/26/2018  . Acquired hypothyroidism 08/25/2017  . 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 Hammer toe 08/25/2017  . History of breast cancer 08/25/2017  . Nontoxic thyroid nodule 08/25/2017  . Osteopetrosis 08/25/2017  . Dysuria 01/19/2017  . Vitamin D deficiency 11/18/2016  . Tremor, essential 08/18/2016  . Convulsions/seizures ([place]) 10/16/2014  . Brain tumor (benign) ([place]) 10/18/2013  . Subdural hemorrhage ([place]) 10/09/2013  . Anxiety 10/09/2013  . Compression fracture of T12 vertebra ([place]) 10/09/2013  . Ejection fraction   . Multiple thyroid nodules 09/05/2013  . Syncope 08/27/2013  . Carotid artery disease ([place]) 08/27/2013  . Abnormal thyroid ultrasound 08/27/2013  . Cancer of right breast (  [place])   . Cancer of central portion of female breast ([place]) 12/30/2012  . Osteoporosis 03/06/2010  . ASTHMA 03/05/2009  . IRRITABLE BOWEL SYNDROME  diarrhea type 03/21/2008  . ANEMIA-NOS 12/16/2006  . GERD 12/16/2006  . Insulin dependent diabetes mellitus 10/29/2006  . Hyperlipidemia 10/29/2006  . Essential hypertension 10/29/2006  . Allergic rhinitis, cause unspecified 10/29/2006  . OSTEOARTHRITIS 10/29/2006    Past Surgical History:  Procedure Laterality Date  . ABDOMINAL HYSTERECTOMY    . BRAIN SURGERY    . BREAST BIOPSY Right 01/24/2013   Procedure: RE-EXCICION OF BREAST CANCER, ANTERIOR MARGINS;  Surgeon: Edward Jolly, MD;  Location: [facility];  Service: General;  Laterality:  Right;  . BREAST LUMPECTOMY Right 2014  . BREAST LUMPECTOMY WITH NEEDLE LOCALIZATION Right 01/17/2013   Procedure: BREAST LUMPECTOMY WITH NEEDLE LOCALIZATION;  Surgeon: Edward Jolly, MD;  Location: [facility];  Service: General;  Laterality: Right;  . BUNIONECTOMY    . CATARACT EXTRACTION W/ INTRAOCULAR LENS IMPLANT Right   . CHOLECYSTECTOMY    . COLONOSCOPY    . CRANIOTOMY Right 10/18/2013   Procedure: CRANIOTOMY TUMOR EXCISION;  Surgeon: Floyce Stakes, MD;  Location: [facility];  Service: Neurosurgery;  Laterality: Right;  . EYE SURGERY    . HERNIA REPAIR    . KNEE ARTHROSCOPY Bilateral   . POLYPECTOMY     small adenomatous  . ULNAR TUNNEL RELEASE       OB History   No obstetric history on file.      Home Medications    Prior to Admission medications   Medication Sig Start Date End Date Taking? Authorizing Provider  ALPRAZolam Duanne Moron) 0.25 MG tablet alprazolam 0.25 mg tablet   0.25 mg by oral route. 07/25/18   [provider]  Biotin 1000 MCG tablet Take 1,000 mcg by mouth daily.     [provider]  Blood Glucose Monitoring Suppl (PRODIGY VOICE BLOOD GLUCOSE) w/Device KIT Use to check blood sugar 1 time per day. 10/18/15   Renato Shin, MD  calcitonin, salmon, (MIACALCIN/FORTICAL) 200 UNIT/ACT nasal spray  11/29/18   [provider]  Cholecalciferol 5000 units capsule Take 10,000 Units by mouth 2 (two) times a day.     [provider]  cholestyramine Lucrezia Starch) 4 g packet  01/23/19   [provider]  cyanocobalamin (,VITAMIN B-12,) 1000 MCG/ML injection INJECT 1 ML INTRAMUSCULARLY EVERY 21 DAYS Patient taking differently: Inject 1,000 mcg into the muscle every 21 ( twenty-one) days.  06/18/16   Hoyt Koch, MD  DiphenhydrAMINE HCl (BENADRYL ALLERGY PO) Take 1 tablet by mouth as needed.    [provider]  empagliflozin (JARDIANCE) 10 MG TABS tablet Take 10 mg by mouth daily.    [provider]   EPINEPHrine 0.3 mg/0.3 mL IJ SOAJ injection Inject 0.3 mLs (0.3 mg total) into the muscle as needed for anaphylaxis. 02/06/19   Deno Etienne, DO  escitalopram (LEXAPRO) 10 MG tablet Take 10 mg by mouth daily.    [provider]  famotidine (PEPCID) 20 MG tablet Take 20 mg by mouth daily.  07/25/18   [provider]  furosemide (LASIX) 20 MG tablet  11/28/18   [provider]  glucose blood test strip Use to check blood sugar 1 time per day. 10/22/15   Renato Shin, MD  insulin glargine (LANTUS) 100 UNIT/ML injection Inject 14 Units into the skin at bedtime.     [provider]  insulin lispro (HUMALOG KWIKPEN) 100  UNIT/ML KiwkPen Inject 0.07 mLs (7 Units total) into the skin 3 (three) times daily with meals. And pen needles 1/day Patient not taking: Reported on 12/01/2018 05/24/17   Renato Shin, MD  Insulin Pen Needle 32G X 4 MM MISC Used to inject insulin 3x daily 11/19/16   Renato Shin, MD  levothyroxine (SYNTHROID) 75 MCG tablet Take 75 mcg by mouth daily before breakfast.    [provider]  lidocaine (LIDODERM) 5 % Place 1 patch onto the skin daily. Remove & Discard patch within 12 hours or as directed by MD Patient taking differently: Place 1-2 patches onto the skin daily as needed (pain). Remove & Discard patch within 12 hours or as directed by MD 01/25/15   Hoyt Koch, MD  loperamide (IMODIUM) 2 MG capsule Take 2 mg by mouth as needed for diarrhea or loose stools.    [provider]  oxyCODONE-acetaminophen (PERCOCET/ROXICET) 5-325 MG tablet Take 1-2 tablets by mouth every 4 (four) hours as needed for severe pain. Patient not taking: Reported on 12/01/2018 10/25/18   Ward, Ozella Almond, PA-C  phenytoin (DILANTIN) 100 MG ER capsule 2 capsules at bedtime and 1 in the morning Patient taking differently: Take 1-2 mg by mouth 2 (two) times daily. Take 1 capsule (100 mg) in the morning and Take 2 capsules (200 mg) at bedtime 06/27/18    Kathrynn Ducking, MD  PHENYTOIN INFATABS 50 MG tablet CHEW 1 TABLET DAILY Patient taking differently: Chew 50 mg by mouth daily. Taking differently total at 150 mg AM and 200 MG PM 07/25/18   Kathrynn Ducking, MD  Polyethyl Glycol-Propyl Glycol 0.4-0.3 % SOLN Place 2 drops into both eyes 2 (two) times a day.     [provider]  primidone (MYSOLINE) 50 MG tablet Take 2 tablets by mouth twice daily 07/14/18   Kathrynn Ducking, MD  Probiotic Product (ALIGN) 4 MG CAPS Take 1 capsule by mouth 3 (three) times daily after meals. Taking differently 1 cap daily    [provider]  saccharomyces boulardii (FLORASTOR) 250 MG capsule Take 250 mg by mouth 2 (two) times daily. Taking differently 1 tab daily    [provider]  traMADol (ULTRAM) 50 MG tablet tramadol 50 mg tablet   50 mg by oral route.    [provider]  trandolapril-verapamil (TARKA) 2-240 MG tablet Take 1 tablet by mouth daily.    [provider]  verapamil (CALAN-SR) 240 MG CR tablet verapamil ER (SR) 240 mg tablet,extended release  TAKE 1 TABLET BY MOUTH EVERY DAY    [provider]    Family History Family History  Problem Relation Age of Onset  . Heart disease Mother   . Osteoporosis Mother   . Diabetes Father   . Pancreatic cancer Father   . Colon cancer Other   . Bone cancer Sister   . Prostate cancer Brother   . Colon cancer Brother   . Rectal cancer Sister   . Thyroid disease Sister        benign goiter resected    Social History Social History   Tobacco Use  . Smoking status: Never Smoker  . Smokeless tobacco: Never Used  Substance Use Topics  . Alcohol use: No  . Drug use: No     Allergies   Bystolic [nebivolol hcl], Cholestyramine, Hydrazine yellow [tartrazine], Morphine, Niacin, Niaspan [niacin er], Norvasc [amlodipine besylate], Optivar [azelastine hcl], Repaglinide, Sular [nisoldipine er], Tegretol [carbamazepine], Telmisartan, Cefdinir, Clonidine,  Clonidine hydrochloride,  Codeine, Ezetimibe, Naproxen, Ziac [bisoprolol-hydrochlorothiazide], Elavil [amitriptyline], Hydralazine, Iodine, Keppra [levetiracetam], Lamictal [lamotrigine], Lyrica [pregabalin], Pseudoephedrine, Topamax [topiramate], Ace inhibitors, Actonel [risedronate sodium], Amlodipine besylate, Aspirin, Atacand [candesartan], Bextra [valdecoxib], Bisoprolol-hydrochlorothiazide, Candesartan cilexetil, Cefadroxil, Celecoxib, Hydrocodone, Hydrocodone-acetaminophen, Iodinated diagnostic agents, Meloxicam, Methylprednisolone sodium succinate, Nabumetone, Penicillins, Pseudoephedrine-guaifenesin, Risedronate sodium, Rofecoxib, Ru-tuss [chlorphen-pse-atrop-hyos-scop], Sulfonamide derivatives, Sulphur [sulfur], Telithromycin, Terfenadine, Trandolapril-verapamil hcl er, Trandolapril-verapamil hcl er, and Valium [diazepam]   Review of Systems Review of Systems  Constitutional: Negative for chills and fever.  HENT: Positive for trouble swallowing. Negative for congestion and rhinorrhea.   Eyes: Negative for redness and visual disturbance.  Respiratory: Negative for shortness of breath and wheezing.   Cardiovascular: Negative for chest pain and palpitations.  Gastrointestinal: Positive for diarrhea, nausea and vomiting.  Genitourinary: Negative for dysuria and urgency.  Musculoskeletal: Negative for arthralgias and myalgias.  Skin: Negative for pallor and wound.  Neurological: Positive for weakness. Negative for dizziness and headaches.     Physical Exam Updated Vital Signs BP (!) 167/83 (BP Location: Right Arm)   Pulse 75   Temp 98.4 F (36.9 C) (Oral)   Resp 16   Ht 5' 7.5" (1.715 m)   Wt 59 kg   SpO2 100%   BMI 20.06 kg/m   Physical Exam Vitals signs and nursing note reviewed.  Constitutional:      General: She is not in acute distress.    Appearance: She is well-developed. She is not diaphoretic.  HENT:     Head: Normocephalic and atraumatic.  Eyes:     Pupils: Pupils  are equal, round, and reactive to light.  Neck:     Musculoskeletal: Normal range of motion and neck supple.  Cardiovascular:     Rate and Rhythm: Normal rate and regular rhythm.     Heart sounds: No murmur. No friction rub. No gallop.   Pulmonary:     Effort: Pulmonary effort is normal.     Breath sounds: No wheezing or rales.  Abdominal:     General: There is no distension.     Palpations: Abdomen is soft.     Tenderness: There is no abdominal tenderness.  Musculoskeletal:        General: No tenderness.  Skin:    General: Skin is warm and dry.  Neurological:     Mental Status: She is alert and oriented to person, place, and time.  Psychiatric:        Behavior: Behavior normal.      ED Treatments / Results  Labs (all labs ordered are listed, but only abnormal results are displayed) Labs Reviewed  CBC WITH DIFFERENTIAL/PLATELET - Abnormal; Notable for the following components:      Result Value   RBC 3.81 (*)    All other components within normal limits  COMPREHENSIVE METABOLIC PANEL - Abnormal; Notable for the following components:   Glucose, Bld 199 (*)    Calcium 8.2 (*)    Total Protein 6.0 (*)    Albumin 3.4 (*)    Total Bilirubin 0.1 (*)    All other components within normal limits    EKG None  Radiology No results found.  Procedures Procedures (including critical care time)  Medications Ordered in ED Medications - No data to display   Initial Impression / Assessment and Plan / ED Course  I have reviewed the triage vital signs and the nursing notes.  Pertinent labs & imaging results that were available during my care of the patient were reviewed by me and considered in my  medical decision making (see chart for details).        85 yo F with a chief complaints of an allergic reaction.  The patient had anaphylaxis with hives and nausea vomiting and diarrhea.  She has had significant improvement since this.  Having no symptoms currently.  I was going  to start her on steroids of the family is a bit reluctant with her getting a steroid today.  Timeline wise it seems also less likely that it was due to an injection as her symptoms occurred after she had had lunch.  I discussed this with her and recommended that she follow-up with her family doctor.  Will prescribe her 2 EpiPen's.  Recommended H1 blockers for the next week.  7:21 PM:  I have discussed the diagnosis/risks/treatment options with the patient and family and believe the pt to be eligible for discharge home to follow-up with PCP. We also discussed returning to the ED immediately if new or worsening sx occur. We discussed the sx which are most concerning (e.g., sudden worsening sob, n/v/d, rash) that necessitate immediate return. Medications administered to the patient during their visit and any new prescriptions provided to the patient are listed below.  Medications given during this visit Medications - No data to display   The patient appears reasonably screen and/or stabilized for discharge and I doubt any other medical condition or other [facility] requiring further screening, evaluation, or treatment in the ED at this time prior to discharge.    Final Clinical Impressions(s) / ED Diagnoses   Final diagnoses:  Allergic reaction, initial encounter    ED Discharge Orders         Ordered    EPINEPHrine 0.3 mg/0.3 mL IJ SOAJ injection  As needed     02/06/19 [place], [place], DO 02/06/19 1921

## 2019-02-06 NOTE — Discharge Instructions (Signed)
You can take Benadryl 50 mg up to 3 times a day for the next week.  You could also elect to take something like Zyrtec which he can take 2 tablets a day.  This should be less sedating.  There is a chance that you have recurrent symptoms that you had today without any re-exposure to the offending agent.  If that happens you need to use the EpiPen and return to the emergency department.  Please follow-up with your family doctor.

## 2019-02-06 NOTE — ED Triage Notes (Addendum)
Pt had injection to knee this am and after lunch began having nausea, vomiting, diarrhea swelling to lips and tongue began to tingle and swell.  PT given 50 mg benadryl, 4 of zofran.  Given 500 ns through 18 g iv.  Pt in no distress.  Airway patent.

## 2019-02-07 ENCOUNTER — Other Ambulatory Visit: Payer: Self-pay | Admitting: *Deleted

## 2019-02-07 NOTE — Patient Outreach (Signed)
[facility]) Care Management  02/07/2019  Kareemah Gorgas Jackson 05-14-1929 [id number]    ED visit 10/5 and Telephone Assessment-Successful  RN spoke with pt today concerning her recent visit to the ED on 10/5. Pt states she had an allergic reaction, treated and sent home. No changes in her medications. Pt denies any residual effects and continue to do well with her recovery.  RN further inquired on her ongoing management of care related to her fall prevention. Pt states she is doing well and continue PT in her provider's office with recommended exercises she continue at home. States exercises on her bike for 30 minutes a day. Pt reports using her walker in the home and with long outings outside the home. Pt reports on some occassions she does not take her walker with her if it is a short distance with the ambulation. States it's to cumbersome and large to take her walker. RN encourage pt to use a cane however pt declined. Stress the importance of prevention measures to avoid future falls and/or injuries as pt verbalized an understanding. States her goals are to get rid of the walker and get up off the floor. RN inquired on an emergency alert system however pt reports she has a panic button to call for help from the police department and feel more comfortable with the police entering her home to assistance. Pt has used this services before and will continue with the service. Other events that have occur since the last conversation related to pt receiving ongoing left knee shots for ongiong lubrication to avoid pain and discomfort due to what pt described as "bone-to-bone".   Plan of care discussed along with goals and interventions were adjusted based upon pt's progress over the last month. Will continue to encourage adherence with avoid falls and falls prevention measures to continue to assist this pt in managing her care. Will scheduled another follow up next month and inquired further on pt's  progress.  THN CM Care Plan Problem One     Most Recent Value  Care Plan Problem One  Impaired safety-Falls related to balance and difficulty with gait  Role Documenting the Problem One  Care Management [place] for Problem One  Active  THN Long Term Goal   Pt will demonstrate use of safety measures to prevent falls within the next 90 days.  THN Long Term Goal Start Date  12/01/18  Interventions for Problem One Long Term Goal  Will discuss the use of a cane however declined but indicates he has ongong therapy in the provider's office and works with PT. Pt also independently exercises on her bike for 30 mins in the home. Will strongly encourage ongoing participation with her exercise regimen and use of her DME to avoid future falls. Will continue to encouraged strengthening exercises to prevent acute events.  THN CM Short Term Goal #2   Pt will increase her exercise regimen within the next 30 days.  THN CM Short Term Goal #2 Start Date  01/04/19  Interventions for Short Term Goal #2  Will encourage pt to continue to exercise regimens as pt continue to visit the office for her ongoing PT therapy. Pt also riding her bike at home 30 mins. RN will continue to encouraged ongoing recommendations from her therpay. Will re-evaluate next month on pt's progress.       Raina Mina, RN Care Management Coordinator [place] Office [phone]

## 2019-02-10 ENCOUNTER — Ambulatory Visit: Payer: Self-pay | Admitting: *Deleted

## 2019-02-13 DIAGNOSIS — M1712 Unilateral primary osteoarthritis, left knee: Secondary | ICD-10-CM | POA: Diagnosis not present

## 2019-02-13 DIAGNOSIS — M25562 Pain in left knee: Secondary | ICD-10-CM | POA: Diagnosis not present

## 2019-02-14 ENCOUNTER — Other Ambulatory Visit: Payer: Self-pay | Admitting: Family Medicine

## 2019-02-14 DIAGNOSIS — Z1231 Encounter for screening mammogram for malignant neoplasm of breast: Secondary | ICD-10-CM

## 2019-02-20 DIAGNOSIS — M25562 Pain in left knee: Secondary | ICD-10-CM | POA: Diagnosis not present

## 2019-02-22 DIAGNOSIS — H26492 Other secondary cataract, left eye: Secondary | ICD-10-CM | POA: Diagnosis not present

## 2019-02-27 DIAGNOSIS — M25512 Pain in left shoulder: Secondary | ICD-10-CM | POA: Diagnosis not present

## 2019-02-28 DIAGNOSIS — Z85820 Personal history of malignant melanoma of skin: Secondary | ICD-10-CM | POA: Diagnosis not present

## 2019-02-28 DIAGNOSIS — L814 Other melanin hyperpigmentation: Secondary | ICD-10-CM | POA: Diagnosis not present

## 2019-02-28 DIAGNOSIS — L570 Actinic keratosis: Secondary | ICD-10-CM | POA: Diagnosis not present

## 2019-02-28 DIAGNOSIS — L819 Disorder of pigmentation, unspecified: Secondary | ICD-10-CM | POA: Diagnosis not present

## 2019-02-28 DIAGNOSIS — L821 Other seborrheic keratosis: Secondary | ICD-10-CM | POA: Diagnosis not present

## 2019-02-28 DIAGNOSIS — D229 Melanocytic nevi, unspecified: Secondary | ICD-10-CM | POA: Diagnosis not present

## 2019-03-06 DIAGNOSIS — M25562 Pain in left knee: Secondary | ICD-10-CM | POA: Diagnosis not present

## 2019-03-10 ENCOUNTER — Other Ambulatory Visit: Payer: Self-pay | Admitting: *Deleted

## 2019-03-10 NOTE — Patient Outreach (Signed)
[facility]) Care Management  03/10/2019  [name] 05/23/1929 [id number]    Telephone Assessment-Unsuccessful  Outreach attempt today unsuccessful. Rn able to leave a HIPAA approved voice message requesting a call back. Will continue to engage as pt continue to manager her call.   PLAN: Will attempt another follow up call over the next week accordingly.  Raina Mina, RN Care Management Coordinator [place] Office [phone]

## 2019-03-13 DIAGNOSIS — M810 Age-related osteoporosis without current pathological fracture: Secondary | ICD-10-CM | POA: Diagnosis not present

## 2019-03-13 DIAGNOSIS — H02051 Trichiasis without entropian right upper eyelid: Secondary | ICD-10-CM | POA: Diagnosis not present

## 2019-03-13 DIAGNOSIS — E039 Hypothyroidism, unspecified: Secondary | ICD-10-CM | POA: Diagnosis not present

## 2019-03-13 DIAGNOSIS — E1165 Type 2 diabetes mellitus with hyperglycemia: Secondary | ICD-10-CM | POA: Diagnosis not present

## 2019-03-13 DIAGNOSIS — Z794 Long term (current) use of insulin: Secondary | ICD-10-CM | POA: Diagnosis not present

## 2019-03-14 ENCOUNTER — Other Ambulatory Visit: Payer: Self-pay

## 2019-03-14 ENCOUNTER — Encounter: Payer: Self-pay | Admitting: Podiatry

## 2019-03-14 ENCOUNTER — Ambulatory Visit (INDEPENDENT_AMBULATORY_CARE_PROVIDER_SITE_OTHER): Payer: Medicare Other | Admitting: Podiatry

## 2019-03-14 DIAGNOSIS — E1142 Type 2 diabetes mellitus with diabetic polyneuropathy: Secondary | ICD-10-CM

## 2019-03-14 DIAGNOSIS — M25512 Pain in left shoulder: Secondary | ICD-10-CM | POA: Diagnosis not present

## 2019-03-14 DIAGNOSIS — E08621 Diabetes mellitus due to underlying condition with foot ulcer: Secondary | ICD-10-CM

## 2019-03-14 DIAGNOSIS — L97501 Non-pressure chronic ulcer of other part of unspecified foot limited to breakdown of skin: Secondary | ICD-10-CM | POA: Diagnosis not present

## 2019-03-14 NOTE — Patient Instructions (Signed)
Diabetes Mellitus and Foot Care Foot care is an important part of your health, especially when you have diabetes. Diabetes [place] cause you to have problems because of poor blood flow (circulation) to your feet and legs, which can cause your skin to:  Become thinner and drier.  Break more easily.  Heal more slowly.  Peel and crack. You [place] also have nerve damage (neuropathy) in your legs and feet, causing decreased feeling in them. This means that you Mcniel not notice minor injuries to your feet that could lead to more serious problems. Noticing and addressing any potential problems early is the best way to prevent future foot problems. How to care for your feet Foot hygiene  Wash your feet daily with warm water and mild soap. Do not use hot water. Then, pat your feet and the areas between your toes until they are completely dry. Do not soak your feet as this can dry your skin.  Trim your toenails straight across. Do not dig under them or around the cuticle. File the edges of your nails with an emery board or nail file.  Apply a moisturizing lotion or petroleum jelly to the skin on your feet and to dry, brittle toenails. Use lotion that does not contain alcohol and is unscented. Do not apply lotion between your toes. Shoes and socks  Wear clean socks or stockings every day. Make sure they are not too tight. Do not wear knee-high stockings since they [place] decrease blood flow to your legs.  Wear shoes that fit properly and have enough cushioning. Always look in your shoes before you put them on to be sure there are no objects inside.  To break in new shoes, wear them for just a few hours a day. This prevents injuries on your feet. Wounds, scrapes, corns, and calluses  Check your feet daily for blisters, cuts, bruises, sores, and redness. If you cannot see the bottom of your feet, use a mirror or ask someone for help.  Do not cut corns or calluses or try to remove them with medicine.  If you  find a minor scrape, cut, or break in the skin on your feet, keep it and the skin around it clean and dry. You Lirette clean these areas with mild soap and water. Do not clean the area with peroxide, alcohol, or iodine.  If you have a wound, scrape, corn, or callus on your foot, look at it several times a day to make sure it is healing and not infected. Check for: ? Redness, swelling, or pain. ? Fluid or blood. ? Warmth. ? Pus or a bad smell. General instructions  Do not cross your legs. This [place] decrease blood flow to your feet.  Do not use heating pads or hot water bottles on your feet. They [place] burn your skin. If you have lost feeling in your feet or legs, you [place] not know this is happening until it is too late.  Protect your feet from hot and cold by wearing shoes, such as at the beach or on hot pavement.  Schedule a complete foot exam at least once a year (annually) or more often if you have foot problems. If you have foot problems, report any cuts, sores, or bruises to your health care provider immediately. Contact a health care provider if:  You have a medical condition that increases your risk of infection and you have any cuts, sores, or bruises on your feet.  You have an injury that is not   healing.  You have redness on your legs or feet.  You feel burning or tingling in your legs or feet.  You have pain or cramps in your legs and feet.  Your legs or feet are numb.  Your feet always feel cold.  You have pain around a toenail. Get help right away if:  You have a wound, scrape, corn, or callus on your foot and: ? You have pain, swelling, or redness that gets worse. ? You have fluid or blood coming from the wound, scrape, corn, or callus. ? Your wound, scrape, corn, or callus feels warm to the touch. ? You have pus or a bad smell coming from the wound, scrape, corn, or callus. ? You have a fever. ? You have a red line going up your leg. Summary  Check your feet every day  for cuts, sores, red spots, swelling, and blisters.  Moisturize feet and legs daily.  Wear shoes that fit properly and have enough cushioning.  If you have foot problems, report any cuts, sores, or bruises to your health care provider immediately.  Schedule a complete foot exam at least once a year (annually) or more often if you have foot problems. This information is not intended to replace advice given to you by your health care provider. Make sure you discuss any questions you have with your health care provider. Document Released: 04/17/2000 Document Revised: 06/02/2017 Document Reviewed: 05/22/2016 Elsevier Patient Education  2020 [employer]  

## 2019-03-17 ENCOUNTER — Telehealth: Payer: Self-pay | Admitting: Neurology

## 2019-03-17 NOTE — Telephone Encounter (Signed)
Pt has called stating that the medication that she is on for her tremors has worked great for her up until she had a hard fall and her head hit the floor.  Pt states the tremors are much worse and the medication is no longer working.  Pt is asking for a call to discuss a change to her medication

## 2019-03-19 NOTE — Progress Notes (Signed)
Subjective: [name] is a 85 y.o. y.o. female who presents for preventative diabetic foot care on today with h/o preulcerative calluses/corns b/l which interfere with daily activities.   [name] was hospitalized for allergic reaction to injection of knee or shoulder joint in October.  Her daughter is present during the visit.   They are asking that paperwork for her diabetic shoes be sent to [name].  Current Outpatient Medications on File Prior to Visit  Medication Sig Dispense Refill  . ALPRAZolam (XANAX) 0.25 MG tablet alprazolam 0.25 mg tablet   0.25 mg by oral route.    . Biotin 1000 MCG tablet Take 1,000 mcg by mouth daily.     . Blood Glucose Monitoring Suppl (PRODIGY VOICE BLOOD GLUCOSE) w/Device KIT Use to check blood sugar 1 time per day. 1 each 2  . calcitonin, salmon, (MIACALCIN/FORTICAL) 200 UNIT/ACT nasal spray     . Cholecalciferol 5000 units capsule Take 10,000 Units by mouth 2 (two) times a day.     . cholestyramine (QUESTRAN) 4 g packet     . cyanocobalamin (,VITAMIN B-12,) 1000 MCG/ML injection INJECT 1 ML INTRAMUSCULARLY EVERY 21 DAYS (Patient taking differently: Inject 1,000 mcg into the muscle every 21 ( twenty-one) days. ) 3 mL 2  . DiphenhydrAMINE HCl (BENADRYL ALLERGY PO) Take 1 tablet by mouth as needed.    . empagliflozin (JARDIANCE) 10 MG TABS tablet Take 10 mg by mouth daily.    Marland Kitchen EPINEPHrine 0.3 mg/0.3 mL IJ SOAJ injection Inject 0.3 mLs (0.3 mg total) into the muscle as needed for anaphylaxis. 2 each 0  . escitalopram (LEXAPRO) 10 MG tablet Take 10 mg by mouth daily.    . famotidine (PEPCID) 20 MG tablet Take 20 mg by mouth daily.     . furosemide (LASIX) 20 MG tablet     . glucose blood test strip Use to check blood sugar 1 time per day. 100 each 3  . insulin glargine (LANTUS) 100 UNIT/ML injection Inject 14 Units into the skin at bedtime.     . insulin lispro (HUMALOG KWIKPEN) 100 UNIT/ML KiwkPen Inject 0.07 mLs (7 Units total) into the  skin 3 (three) times daily with meals. And pen needles 1/day (Patient not taking: Reported on 12/01/2018) 15 pen 3  . Insulin Pen Needle 32G X 4 MM MISC Used to inject insulin 3x daily 270 each 2  . levothyroxine (SYNTHROID) 75 MCG tablet Take 75 mcg by mouth daily before breakfast.    . lidocaine (LIDODERM) 5 % Place 1 patch onto the skin daily. Remove & Discard patch within 12 hours or as directed by MD (Patient taking differently: Place 1-2 patches onto the skin daily as needed (pain). Remove & Discard patch within 12 hours or as directed by MD) 90 patch 1  . loperamide (IMODIUM) 2 MG capsule Take 2 mg by mouth as needed for diarrhea or loose stools.    . nitrofurantoin, macrocrystal-monohydrate, (MACROBID) 100 MG capsule nitrofurantoin monohydrate/macrocrystals 100 mg capsule    . oxyCODONE-acetaminophen (PERCOCET/ROXICET) 5-325 MG tablet Take 1-2 tablets by mouth every 4 (four) hours as needed for severe pain. (Patient not taking: Reported on 12/01/2018) 15 tablet 0  . phenytoin (DILANTIN) 100 MG ER capsule 2 capsules at bedtime and 1 in the morning (Patient taking differently: Take 1-2 mg by mouth 2 (two) times daily. Take 1 capsule (100 mg) in the morning and Take 2 capsules (200 mg) at bedtime) 270 capsule 2  . PHENYTOIN INFATABS 50  MG tablet CHEW 1 TABLET DAILY (Patient taking differently: Chew 50 mg by mouth daily. Taking differently total at 150 mg AM and 200 MG PM) 90 tablet 3  . Polyethyl Glycol-Propyl Glycol 0.4-0.3 % SOLN Place 2 drops into both eyes 2 (two) times a day.     . primidone (MYSOLINE) 50 MG tablet Take 2 tablets by mouth twice daily 360 tablet 1  . Probiotic Product (ALIGN) 4 MG CAPS Take 1 capsule by mouth 3 (three) times daily after meals. Taking differently 1 cap daily    . saccharomyces boulardii (FLORASTOR) 250 MG capsule Take 250 mg by mouth 2 (two) times daily. Taking differently 1 tab daily    . traMADol (ULTRAM) 50 MG tablet tramadol 50 mg tablet   50 mg by oral  route.    . trandolapril-verapamil (TARKA) 2-240 MG tablet Take 1 tablet by mouth daily.    . verapamil (CALAN-SR) 240 MG CR tablet verapamil ER (SR) 240 mg tablet,extended release  TAKE 1 TABLET BY MOUTH EVERY DAY     No current facility-administered medications on file prior to visit.     Allergies  Allergen Reactions  . Bystolic [Nebivolol Hcl] Other (See Comments)    "extreme weakness, heaviness in legs & arms, swelling in legs/arms/face, swollen abdomen, pain in bladder, feet pain, soreness in chest"  . Cholestyramine     "itching rash on stomach, bloated, nausea, vomiting, sleeplessness, extreme pain in arms"  . Hydrazine Yellow [Tartrazine] Other (See Comments)    "does not reduce high blood pressure, pain in arm, high pressure, felt like I was on verge of heart attack, really weak"  . Morphine Other (See Comments)    Feels morbid, weak, still in pain  . Niacin Palpitations    Fast heart beat  . Niaspan [Niacin Er] Palpitations and Other (See Comments)    "fast heart beat, high blood pressure"  . Norvasc [Amlodipine Besylate] Other (See Comments)    "extreme fluid retention/pain)  . Optivar [Azelastine Hcl] Photosensitivity  . Repaglinide Hives  . Sular [Nisoldipine Er] Other (See Comments)    "severe headaches, swelling eyes, hands, feet, shortness of breath, weak, flushed face, brain boiling, fluid retention, high blood sugar, nervous, heart fast beating"  . Tegretol [Carbamazepine] Other (See Comments)    Blood poisoning   . Telmisartan Other (See Comments)    "headache, difficulty urinating, high blood sugar, fluid retention"  . Cefdinir Swelling    Vaginal irritation, breathing,   . Clonidine Other (See Comments)    Dry mouth, fluid retention  . Clonidine Hydrochloride Other (See Comments)    Dry mouth, fluid retention  . Codeine Nausea And Vomiting  . Ezetimibe Other (See Comments)    Made weak  . Naproxen Other (See Comments)    Shrinks bladder  . Ziac  [Bisoprolol-Hydrochlorothiazide] Other (See Comments)    "stopped urination"  . Elavil [Amitriptyline] Other (See Comments)    Gave Pt nightmares  . Hydralazine   . Iodine   . Keppra [Levetiracetam] Other (See Comments)    Shaking  . Lamictal [Lamotrigine]     itching  . Lyrica [Pregabalin] Swelling  . Pseudoephedrine   . Topamax [Topiramate]     Dry eyes  . Ace Inhibitors Other (See Comments)    unknown  . Actonel [Risedronate Sodium] Other (See Comments)    unknown  . Amlodipine Besylate Other (See Comments)    unknown  . Aspirin Other (See Comments)    unknown  . Atacand [Candesartan]  Other (See Comments)    unknown  . Bextra [Valdecoxib] Other (See Comments)    unknown  . Bisoprolol-Hydrochlorothiazide Other (See Comments)    unknown  . Candesartan Cilexetil Other (See Comments)    unknown  . Cefadroxil Other (See Comments)    unknown  . Celecoxib Rash  . Hydrocodone Other (See Comments)    unknown  . Hydrocodone-Acetaminophen Other (See Comments)    unknown  . Iodinated Diagnostic Agents Rash    "All over" "All over"  . Meloxicam Other (See Comments)    unknown  . Methylprednisolone Sodium Succinate Other (See Comments)    unknown  . Nabumetone Other (See Comments)    unknown  . Penicillins Other (See Comments)    unknown  . Pseudoephedrine-Guaifenesin Other (See Comments)    unknown  . Risedronate Sodium Other (See Comments)    unknown  . Rofecoxib Other (See Comments)    unknown  . Ru-Tuss [Chlorphen-Pse-Atrop-Hyos-Scop] Other (See Comments)    unknown  . Sulfonamide Derivatives Other (See Comments)    unknown  . Sulphur [Sulfur] Other (See Comments)    unknown  . Telithromycin Other (See Comments)    unknown  . Terfenadine Other (See Comments)    unknown  . Trandolapril-Verapamil Hcl Er Other (See Comments)    Headache, difficulty urinating, high blood sugar, fluid retention  Pt is taking Tarka (trandolapril-verapamil) currently, but requests  the medication stay in her allergy list  . Trandolapril-Verapamil Hcl Er Other (See Comments)    Headache, difficulty urinating, high blood sugar, fluid retention  Pt is taking Tarka (trandolapril-verapamil) currently, but requests the medication stay in her allergy list  . Valium [Diazepam] Other (See Comments)    Makes her mean and hyper   Objective: There were no vitals filed for this visit.  Vascular Examination: Capillary refill time <3 seconds b/l.  Dorsalis pedis and Posterior tibial pulses faintly palpable b/l.  Digital hair absent b/l.   Skin temperature gradient WNL b/l.  Dermatological Examination: Skin with normal turgor, texture and tone b/l.  Toenails 1-5 b/l discolored, thick, dystrophic with subungual debris and pain with palpation to nailbeds due to thickness of nails.  Hyperkeratotic lesion submet head 1 b/l measures 1.0 x 1.0 cm each with subdermal hemorrhage of right foot lesion. No erythema, no edema, no drainage, no flocculence noted. No warmth.  Porokeratotic lesions distal tip right 3rd digit and left 2nd digit with subdermal hemorrhage and tenderness to palpation. Measures 0.5 x 0.5 cm each. No erythema, no edema, no drainage, no flocculence noted.  Musculoskeletal: Muscle strength 5/5 to all LE muscle groups b/l.  Hammertoes 2-5 b/l.  Neurological: Sensation diminished b/l with 10 gram monofilament.  Vibratory sensation diminished b/l.  Assessment: 1.  Partial thickness diabetic ulcer submet head 1, noninfected 2.  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  NIDDM with neuropathy  Plan: 1. Continue diabetic foot care principles. Literature dispensed on today. 2. Partial thickness ulcer submet head 1 right foot pared with sterile scalpel blade without incident. Callus pad applied. 3. Preulcerative porokeratoses  left 2nd digits pared and enucleated with sterile scalpel blade without incident. Porokeratosis right  3rd and pared and enucleated with pinpoint bleeding addressed with Lumicain Hemostatic solution. Cleansed with alcohol and light dressing applied.  Gick remove dressing on tomorrow. Dispensed digital toe caps for these digits for daily protection/comfort. 4. Patient to continue soft, supportive shoe gear daily.  5. We will send diabetic shoe paperwork to Dr.  Kirk Ruths Gorris as requested by Ms. Piscitelli.  6. Patient to report any pedal injuries to medical professional immediately. 7. Follow up 6 weeks.  8. Patient/POA to call should there be a concern in the interim.

## 2019-03-20 DIAGNOSIS — E1165 Type 2 diabetes mellitus with hyperglycemia: Secondary | ICD-10-CM | POA: Diagnosis not present

## 2019-03-20 DIAGNOSIS — M810 Age-related osteoporosis without current pathological fracture: Secondary | ICD-10-CM | POA: Diagnosis not present

## 2019-03-20 DIAGNOSIS — Z794 Long term (current) use of insulin: Secondary | ICD-10-CM | POA: Diagnosis not present

## 2019-03-20 DIAGNOSIS — M25512 Pain in left shoulder: Secondary | ICD-10-CM | POA: Diagnosis not present

## 2019-03-20 MED ORDER — PRIMIDONE 50 MG PO TABS: ORAL_TABLET | ORAL | 0 refills | Status: DC

## 2019-03-20 NOTE — Addendum Note (Signed)
Addended by: Verlin Grills T on: 03/20/2019 08:19 AM   Modules accepted: Orders

## 2019-03-20 NOTE — Telephone Encounter (Signed)
I reached out to the pt.  She reports increase in tremors since she fell and hit her head a couple of weeks ago. Pt also reports she is almost out of her primidone.  Pt was advised we would need to schedule and appt to further discuss this.  Pt was originally scheduled with SS, NP on 04/24/2019 but I advised we could move her appt up to 04/04/2019 and she was agreeable.   I have rescheduled to 04/04/2019 with MM, NP and called in a 30 day supply of Primidone to [facility] until the pt is seen. Pt was advised to call back if her sx worsen or if new sx presented.

## 2019-03-21 ENCOUNTER — Other Ambulatory Visit: Payer: Self-pay | Admitting: *Deleted

## 2019-03-21 DIAGNOSIS — K580 Irritable bowel syndrome with diarrhea: Secondary | ICD-10-CM | POA: Diagnosis not present

## 2019-03-21 DIAGNOSIS — K9089 Other intestinal malabsorption: Secondary | ICD-10-CM | POA: Diagnosis not present

## 2019-03-21 DIAGNOSIS — R159 Full incontinence of feces: Secondary | ICD-10-CM | POA: Diagnosis not present

## 2019-03-21 NOTE — Patient Outreach (Signed)
[facility]) Care Management  03/21/2019  Billie Vitacco Knoble 28-Apr-1930 [id number]   Telephone Assessment-Unsuccessful  RN attempted outreach call to pt today however unsuccessful. RN unable to leave a a message to request a call back. Will initiate another outreach call within the next week.  Raina Mina, RN Care Management Coordinator [place] Office [phone]

## 2019-03-24 DIAGNOSIS — F411 Generalized anxiety disorder: Secondary | ICD-10-CM | POA: Diagnosis not present

## 2019-03-24 DIAGNOSIS — E119 Type 2 diabetes mellitus without complications: Secondary | ICD-10-CM | POA: Diagnosis not present

## 2019-03-24 DIAGNOSIS — G4700 Insomnia, unspecified: Secondary | ICD-10-CM | POA: Diagnosis not present

## 2019-03-24 DIAGNOSIS — I10 Essential (primary) hypertension: Secondary | ICD-10-CM | POA: Diagnosis not present

## 2019-03-27 ENCOUNTER — Other Ambulatory Visit: Payer: Self-pay | Admitting: *Deleted

## 2019-03-27 DIAGNOSIS — M1712 Unilateral primary osteoarthritis, left knee: Secondary | ICD-10-CM | POA: Diagnosis not present

## 2019-03-27 NOTE — Patient Outreach (Signed)
[facility]) Care Management  03/27/2019  Kaylii Moronta Hausner 02/03/1930 [id number]    Telephone Assessment  RN attempted outreach call to pt today however unsuccessful and RN unable to leave a voice message to request a call back.  PLAN:  Will send outreach letter and await a call back to reume ongoing case management services. If no call back will close this case within the next 10 days.  Raina Mina, RN Care Management Coordinator [place] [facility] [phone]

## 2019-03-28 DIAGNOSIS — M1712 Unilateral primary osteoarthritis, left knee: Secondary | ICD-10-CM | POA: Diagnosis not present

## 2019-03-31 ENCOUNTER — Other Ambulatory Visit: Payer: Self-pay | Admitting: Neurology

## 2019-04-03 DIAGNOSIS — Z0000 Encounter for general adult medical examination without abnormal findings: Secondary | ICD-10-CM | POA: Diagnosis not present

## 2019-04-03 DIAGNOSIS — M170 Bilateral primary osteoarthritis of knee: Secondary | ICD-10-CM | POA: Diagnosis not present

## 2019-04-03 DIAGNOSIS — S82001S Unspecified fracture of right patella, sequela: Secondary | ICD-10-CM | POA: Diagnosis not present

## 2019-04-03 DIAGNOSIS — K219 Gastro-esophageal reflux disease without esophagitis: Secondary | ICD-10-CM | POA: Diagnosis not present

## 2019-04-03 DIAGNOSIS — I10 Essential (primary) hypertension: Secondary | ICD-10-CM | POA: Diagnosis not present

## 2019-04-04 ENCOUNTER — Other Ambulatory Visit: Payer: Self-pay

## 2019-04-04 ENCOUNTER — Ambulatory Visit (INDEPENDENT_AMBULATORY_CARE_PROVIDER_SITE_OTHER): Payer: Medicare Other | Admitting: Adult Health

## 2019-04-04 ENCOUNTER — Encounter: Payer: Self-pay | Admitting: Adult Health

## 2019-04-04 VITALS — BP 134/80 | HR 70 | Temp 97.5°F | Ht 67.5 in | Wt 136.4 lb

## 2019-04-04 DIAGNOSIS — G250 Essential tremor: Secondary | ICD-10-CM | POA: Diagnosis not present

## 2019-04-04 DIAGNOSIS — R569 Unspecified convulsions: Secondary | ICD-10-CM

## 2019-04-04 MED ORDER — PRIMIDONE 50 MG PO TABS: ORAL_TABLET | ORAL | 3 refills | Status: DC

## 2019-04-04 MED ORDER — PHENYTOIN SODIUM EXTENDED 100 MG PO CAPS: ORAL_CAPSULE | ORAL | 3 refills | Status: DC

## 2019-04-04 NOTE — Progress Notes (Signed)
PATIENT: [name] DOB: 1929-07-08  REASON FOR VISIT: follow up HISTORY FROM: patient  HISTORY OF PRESENT ILLNESS: Today 04/04/19:  [name] is an 85 year old female with a history of essential tremor.  She returns today for follow-up.  She states that she had a fall back in August and since then her tremors have been worse.  She did go to the ED and a CT scan of the head was completed.  The CT was relatively unremarkable.  She states the tremors are in both hands.  She notices that with most activities.  She states that she has a hard time holding onto objects.  She is currently taking primidone 100 mg in the morning and 100 mg in the evening.  She also has Xanax but only takes it at bedtime to help with sleep.  She states that when she fell she was emptying the dishwasher and fell backwards.  She returns today for follow-up.  HISTORY [name] is an 85 year old right-handed white female with a history of an essential tremor.  The patient believes that the Mysoline has worked well but the effects of the medication are wearing off towards the evening.  The patient is having some anxiety issues, she will take alprazolam if needed.  The patient has not had any recent seizures, she [name] have had an event in [name], she had a bitten tongue and was not sure exactly how it happened.  The patient has had some gait instability, she underwent physical therapy about 1 year ago, she had a recent fall about 2 weeks ago and bruised her knees.  The patient has difficulty getting up when she falls.  She does not use a cane or a walker.  She does have some mild memory issues that have remained stable over time.   REVIEW OF SYSTEMS: Out of a complete 14 system review of symptoms, the patient complains only of the following symptoms, and all other reviewed systems are negative.  See HPI  ALLERGIES: Allergies  Allergen Reactions  . Bystolic [Nebivolol Hcl] Other (See Comments)    "extreme weakness, heaviness  in legs & arms, swelling in legs/arms/face, swollen abdomen, pain in bladder, feet pain, soreness in chest"  . Cholestyramine     "itching rash on stomach, bloated, nausea, vomiting, sleeplessness, extreme pain in arms"  . Hydrazine Yellow [Tartrazine] Other (See Comments)    "does not reduce high blood pressure, pain in arm, high pressure, felt like I was on verge of heart attack, really weak"  . Morphine Other (See Comments)    Feels morbid, weak, still in pain  . Niacin Palpitations    Fast heart beat  . Niaspan [Niacin Er] Palpitations and Other (See Comments)    "fast heart beat, high blood pressure"  . Norvasc [Amlodipine Besylate] Other (See Comments)    "extreme fluid retention/pain)  . Optivar [Azelastine Hcl] Photosensitivity  . Repaglinide Hives  . Sular [Nisoldipine Er] Other (See Comments)    "severe headaches, swelling eyes, hands, feet, shortness of breath, weak, flushed face, brain boiling, fluid retention, high blood sugar, nervous, heart fast beating"  . Tegretol [Carbamazepine] Other (See Comments)    Blood poisoning   . Telmisartan Other (See Comments)    "headache, difficulty urinating, high blood sugar, fluid retention"  . Cefdinir Swelling    Vaginal irritation, breathing,   . Clonidine Other (See Comments)    Dry mouth, fluid retention  . Clonidine Hydrochloride Other (See Comments)  Dry mouth, fluid retention  . Codeine Nausea And Vomiting  . Ezetimibe Other (See Comments)    Made weak  . Naproxen Other (See Comments)    Shrinks bladder  . Ziac [Bisoprolol-Hydrochlorothiazide] Other (See Comments)    "stopped urination"  . Elavil [Amitriptyline] Other (See Comments)    Gave Pt nightmares  . Hydralazine   . Iodine   . Keppra [Levetiracetam] Other (See Comments)    Shaking  . Lamictal [Lamotrigine]     itching  . Lyrica [Pregabalin] Swelling  . Pseudoephedrine   . Topamax [Topiramate]     Dry eyes  . Ace Inhibitors Other (See Comments)     unknown  . Actonel [Risedronate Sodium] Other (See Comments)    unknown  . Amlodipine Besylate Other (See Comments)    unknown  . Aspirin Other (See Comments)    unknown  . Atacand [Candesartan] Other (See Comments)    unknown  . Bextra [Valdecoxib] Other (See Comments)    unknown  . Bisoprolol-Hydrochlorothiazide Other (See Comments)    unknown  . Candesartan Cilexetil Other (See Comments)    unknown  . Cefadroxil Other (See Comments)    unknown  . Celecoxib Rash  . Hydrocodone Other (See Comments)    unknown  . Hydrocodone-Acetaminophen Other (See Comments)    unknown  . Iodinated Diagnostic Agents Rash    "All over" "All over"  . Meloxicam Other (See Comments)    unknown  . Methylprednisolone Sodium Succinate Other (See Comments)    unknown  . Nabumetone Other (See Comments)    unknown  . Penicillins Other (See Comments)    unknown  . Pseudoephedrine-Guaifenesin Other (See Comments)    unknown  . Risedronate Sodium Other (See Comments)    unknown  . Rofecoxib Other (See Comments)    unknown  . Ru-Tuss [Chlorphen-Pse-Atrop-Hyos-Scop] Other (See Comments)    unknown  . Sulfonamide Derivatives Other (See Comments)    unknown  . Sulphur [Sulfur] Other (See Comments)    unknown  . Telithromycin Other (See Comments)    unknown  . Terfenadine Other (See Comments)    unknown  . Trandolapril-Verapamil Hcl Er Other (See Comments)    Headache, difficulty urinating, high blood sugar, fluid retention  Pt is taking Tarka (trandolapril-verapamil) currently, but requests the medication stay in her allergy list  . Trandolapril-Verapamil Hcl Er Other (See Comments)    Headache, difficulty urinating, high blood sugar, fluid retention  Pt is taking Tarka (trandolapril-verapamil) currently, but requests the medication stay in her allergy list  . Valium [Diazepam] Other (See Comments)    Makes her mean and hyper    HOME MEDICATIONS: Outpatient Medications Prior to Visit   Medication Sig Dispense Refill  . ALPRAZolam (XANAX) 0.25 MG tablet alprazolam 0.25 mg tablet   0.25 mg by oral route.    . Biotin 1000 MCG tablet Take 1,000 mcg by mouth daily.     . Blood Glucose Monitoring Suppl (PRODIGY VOICE BLOOD GLUCOSE) w/Device KIT Use to check blood sugar 1 time per day. 1 each 2  . calcitonin, salmon, (MIACALCIN/FORTICAL) 200 UNIT/ACT nasal spray     . Cholecalciferol 5000 units capsule Take 10,000 Units by mouth 2 (two) times a day.     . cholestyramine (QUESTRAN) 4 g packet     . cyanocobalamin (,VITAMIN B-12,) 1000 MCG/ML injection INJECT 1 ML INTRAMUSCULARLY EVERY 21 DAYS (Patient taking differently: Inject 1,000 mcg into the muscle every 21 ( twenty-one) days. ) 3 mL 2  .  DiphenhydrAMINE HCl (BENADRYL ALLERGY PO) Take 1 tablet by mouth as needed.    . empagliflozin (JARDIANCE) 10 MG TABS tablet Take 10 mg by mouth daily.    Marland Kitchen EPINEPHrine 0.3 mg/0.3 mL IJ SOAJ injection Inject 0.3 mLs (0.3 mg total) into the muscle as needed for anaphylaxis. 2 each 0  . escitalopram (LEXAPRO) 10 MG tablet Take 10 mg by mouth daily.    . famotidine (PEPCID) 20 MG tablet Take 20 mg by mouth daily.     . furosemide (LASIX) 20 MG tablet     . glucose blood test strip Use to check blood sugar 1 time per day. 100 each 3  . insulin glargine (LANTUS) 100 UNIT/ML injection Inject 14 Units into the skin at bedtime.     . insulin lispro (HUMALOG KWIKPEN) 100 UNIT/ML KiwkPen Inject 0.07 mLs (7 Units total) into the skin 3 (three) times daily with meals. And pen needles 1/day (Patient not taking: Reported on 12/01/2018) 15 pen 3  . Insulin Pen Needle 32G X 4 MM MISC Used to inject insulin 3x daily 270 each 2  . levothyroxine (SYNTHROID) 75 MCG tablet Take 75 mcg by mouth daily before breakfast.    . lidocaine (LIDODERM) 5 % Place 1 patch onto the skin daily. Remove & Discard patch within 12 hours or as directed by MD (Patient taking differently: Place 1-2 patches onto the skin daily as needed  (pain). Remove & Discard patch within 12 hours or as directed by MD) 90 patch 1  . loperamide (IMODIUM) 2 MG capsule Take 2 mg by mouth as needed for diarrhea or loose stools.    . nitrofurantoin, macrocrystal-monohydrate, (MACROBID) 100 MG capsule nitrofurantoin monohydrate/macrocrystals 100 mg capsule    . oxyCODONE-acetaminophen (PERCOCET/ROXICET) 5-325 MG tablet Take 1-2 tablets by mouth every 4 (four) hours as needed for severe pain. (Patient not taking: Reported on 12/01/2018) 15 tablet 0  . phenytoin (DILANTIN) 100 MG ER capsule Take 1 capsule (100 mg total) by mouth 2 (two) times daily. Take 1 capsule (100 mg) in the morning and Take 2 capsules (200 mg) at bedtime 270 capsule 3  . PHENYTOIN INFATABS 50 MG tablet CHEW 1 TABLET DAILY (Patient taking differently: Chew 50 mg by mouth daily. Taking differently total at 150 mg AM and 200 MG PM) 90 tablet 3  . Polyethyl Glycol-Propyl Glycol 0.4-0.3 % SOLN Place 2 drops into both eyes 2 (two) times a day.     . primidone (MYSOLINE) 50 MG tablet Take 2 tablets by mouth twice daily 120 tablet 0  . Probiotic Product (ALIGN) 4 MG CAPS Take 1 capsule by mouth 3 (three) times daily after meals. Taking differently 1 cap daily    . saccharomyces boulardii (FLORASTOR) 250 MG capsule Take 250 mg by mouth 2 (two) times daily. Taking differently 1 tab daily    . traMADol (ULTRAM) 50 MG tablet tramadol 50 mg tablet   50 mg by oral route.    . trandolapril-verapamil (TARKA) 2-240 MG tablet Take 1 tablet by mouth daily.    . verapamil (CALAN-SR) 240 MG CR tablet verapamil ER (SR) 240 mg tablet,extended release  TAKE 1 TABLET BY MOUTH EVERY DAY     No facility-administered medications prior to visit.     PAST MEDICAL HISTORY: Past Medical History:  Diagnosis Date  . Anemia   . Anxiety   . Breast cancer [facility]) 2014   right breast  . Cancer of right breast ([place]) 12/26/12   right breast 12:00  o'clock, DCIS  . Chronic cough   . Chronic facial pain   . Chronic  foot pain   . Complication of anesthesia    Sore jaw; could not chew or move mouth  . Convulsions/seizures ([place]) 10/16/2014  . Diabetes mellitus    type 2 niddm x 20 years  . Dyslipidemia   . Ejection fraction   . GERD (gastroesophageal reflux disease)   . Hammer toe    bilateral  . History of colonic polyps   . HTN (hypertension)   . Hx of radiation therapy 03/07/13- 03/29/13   right breast 4250 cGy 17 sessions  . Hyperlipidemia   . Melanoma ([place])   . Metatarsal bone fracture 2014  . Multiple drug allergies   . Obesity   . Osteoarthritis   . Palpitations   . Personal history of radiation therapy 2014  . Seasonal allergies   . Skin cancer   . Tremor, essential 08/18/2016  . Vitamin B12 deficiency     PAST SURGICAL HISTORY: Past Surgical History:  Procedure Laterality Date  . ABDOMINAL HYSTERECTOMY    . BRAIN SURGERY    . BREAST BIOPSY Right 01/24/2013   Procedure: RE-EXCICION OF BREAST CANCER, ANTERIOR MARGINS;  Surgeon: Edward Jolly, MD;  Location: [facility];  Service: General;  Laterality: Right;  . BREAST LUMPECTOMY Right 2014  . BREAST LUMPECTOMY WITH NEEDLE LOCALIZATION Right 01/17/2013   Procedure: BREAST LUMPECTOMY WITH NEEDLE LOCALIZATION;  Surgeon: Edward Jolly, MD;  Location: [facility];  Service: General;  Laterality: Right;  . BUNIONECTOMY    . CATARACT EXTRACTION W/ INTRAOCULAR LENS IMPLANT Right   . CHOLECYSTECTOMY    . COLONOSCOPY    . CRANIOTOMY Right 10/18/2013   Procedure: CRANIOTOMY TUMOR EXCISION;  Surgeon: Floyce Stakes, MD;  Location: [facility];  Service: Neurosurgery;  Laterality: Right;  . EYE SURGERY    . HERNIA REPAIR    . KNEE ARTHROSCOPY Bilateral   . POLYPECTOMY     small adenomatous  . ULNAR TUNNEL RELEASE      FAMILY HISTORY: Family History  Problem Relation Age of Onset  . Heart disease Mother   . Osteoporosis Mother   . Diabetes Father   . Pancreatic cancer Father   . Colon cancer Other   . Bone cancer Sister   .  Prostate cancer Brother   . Colon cancer Brother   . Rectal cancer Sister   . Thyroid disease Sister        benign goiter resected    SOCIAL HISTORY: Social History   Socioeconomic History  . Marital status: Widowed    Spouse name: Not on file  . Number of children: 0  . Years of education: college  . Highest education level: Not on file  Occupational History  . Occupation: retired  Scientific laboratory technician  . Financial resource strain: Not on file  . Food insecurity    Worry: Not on file    Inability: Not on file  . Transportation needs    Medical: Not on file    Non-medical: Not on file  Tobacco Use  . Smoking status: Never Smoker  . Smokeless tobacco: Never Used  Substance and Sexual Activity  . Alcohol use: No  . Drug use: No  . Sexual activity: Yes    Comment: menarche age 66, hysterectomy age 50, HRT x 2-3 mos, G1- miscarriage  Lifestyle  . Physical activity    Days per week: Not on file    Minutes per  session: Not on file  . Stress: Not on file  Relationships  . Social Herbalist on phone: Not on file    Gets together: Not on file    Attends religious service: Not on file    Active member of club or organization: Not on file    Attends meetings of clubs or organizations: Not on file    Relationship status: Not on file  . Intimate partner violence    Fear of current or ex partner: Not on file    Emotionally abused: Not on file    Physically abused: Not on file    Forced sexual activity: Not on file  Other Topics Concern  . Not on file  Social History Narrative   Widowed, lives alone.  Ambulates independently.    Drinks decaf only   Patient is right handed.      PHYSICAL EXAM  Vitals:   04/04/19 1324  BP: 134/80  Pulse: 70  Temp: (!) 97.5 F (36.4 C)  TempSrc: Oral  Weight: 136 lb 6.4 oz (61.9 kg)  Height: 5' 7.5" (1.715 m)   Body mass index is 21.05 kg/m.  Generalized: Well developed, in no acute distress   Neurological examination   Mentation: Alert oriented to time, place, history taking. Follows all commands speech and language fluent Cranial nerve II-XII: Pupils were equal round reactive to light. Extraocular movements were full, visual field were full on confrontational test.  Head turning and shoulder shrug  were normal and symmetric. Motor: The motor testing reveals 5 over 5 strength of all 4 extremities. Good symmetric motor tone is noted throughout.  Sensory: Sensory testing is intact to soft touch on all 4 extremities. No evidence of extinction is noted.  Coordination: Cerebellar testing reveals good finger-nose-finger and heel-to-shin bilaterally.  Intention tremor noted in finger-nose-finger bilaterally.  Mild tremor noted in both extremities at rest. Gait and station: Gait is normal.    DIAGNOSTIC DATA (LABS, IMAGING, TESTING) - I reviewed patient records, labs, notes, testing and imaging myself where available.  Lab Results  Component Value Date   WBC 8.5 02/06/2019   HGB 12.2 02/06/2019   HCT 36.6 02/06/2019   MCV 96.1 02/06/2019   PLT 288 02/06/2019      Component Value Date/Time   NA 136 02/06/2019 1511   NA 138 04/30/[name] 1053   NA 138 01/04/2013 1212   K 4.4 02/06/2019 1511   K 3.9 01/04/2013 1212   CL 103 02/06/2019 1511   CO2 24 02/06/2019 1511   CO2 22 01/04/2013 1212   GLUCOSE 199 (H) 02/06/2019 1511   GLUCOSE 285 (H) 01/04/2013 1212   BUN 15 02/06/2019 1511   BUN 11 04/30/[name] 1053   BUN 10.3 01/04/2013 1212   CREATININE 0.83 02/06/2019 1511   CREATININE 0.78 12/01/2013 1659   CREATININE 1.0 01/04/2013 1212   CALCIUM 8.2 (L) 02/06/2019 1511   CALCIUM 9.6 01/04/2013 1212   PROT 6.0 (L) 02/06/2019 1511   PROT 5.8 (L) 04/30/[name] 1053   PROT 6.4 01/04/2013 1212   ALBUMIN 3.4 (L) 02/06/2019 1511   ALBUMIN 4.1 04/30/[name] 1053   ALBUMIN 3.6 01/04/2013 1212   AST 28 02/06/2019 1511   AST 14 01/04/2013 1212   ALT 30 02/06/2019 1511   ALT 16 01/04/2013 1212   ALKPHOS 78 02/06/2019  1511   ALKPHOS 52 01/04/2013 1212   BILITOT 0.1 (L) 02/06/2019 1511   BILITOT 0.2 04/30/[name] 1053   BILITOT 1.01 01/04/2013 1212  GFRNONAA >60 02/06/2019 1511   GFRAA >60 02/06/2019 1511   Lab Results  Component Value Date   CHOL 148 01/30/2013   HDL 47.10 01/30/2013   LDLCALC 65 01/30/2013   LDLDIRECT 85.7 09/05/2009   TRIG 179.0 (H) 01/30/2013   CHOLHDL 3 01/30/2013   Lab Results  Component Value Date   HGBA1C 8.4 (H) 10/26/2018   Lab Results  Component Value Date   VITAMINB12 501 09/05/2009   Lab Results  Component Value Date   TSH 1.60 03/02/2016      ASSESSMENT AND PLAN 85 y.o. year old female  has a past medical history of Anemia, Anxiety, Breast cancer ([place]) (2014), Cancer of right breast ([place]) (12/26/12), Chronic cough, Chronic facial pain, Chronic foot pain, Complication of anesthesia, Convulsions/seizures ([place]) (10/16/2014), Diabetes mellitus, Dyslipidemia, Ejection fraction, GERD (gastroesophageal reflux disease), Hammer toe, History of colonic polyps, HTN (hypertension), radiation therapy (03/07/13- 03/29/13), Hyperlipidemia, Melanoma ([place]), Metatarsal bone fracture (2014), Multiple drug allergies, Obesity, Osteoarthritis, Palpitations, Personal history of radiation therapy (2014), Seasonal allergies, Skin cancer, Tremor, essential (08/18/2016), and Vitamin B12 deficiency. here with:  1.  Essential tremor 2.  Seizures  On exam the patient's tremor is fairly mild.  Advised that we will adjust her dosing for primidone.  She will take 100 mg in the morning, 50 mg at lunch and 50 mg at bedtime.  I will check a primidone level today.  She will continue on Dilantin for seizures.  I have advised that if her symptoms worsen or she develops new symptoms she should let us know.  She will follow-up in 6 months or sooner if needed.      Ward Givens, MSN, NP-C 04/04/2019, 1:22 PM [facility] [address], [place], [place] [address] [phone]

## 2019-04-04 NOTE — Patient Instructions (Signed)
Your Plan:  Change primidone- take 100mg  in the AM, 50 mg at lunch and 50 mg at dinner time.  If your symptoms worsen or you develop new symptoms please let us know.   Thank you for coming to see [place] at [facility]. I hope we have been able to provide you high quality care today.  You Starry receive a patient satisfaction survey over the next few weeks. We would appreciate your feedback and comments so that we Rzasa continue to improve ourselves and the health of our patients.

## 2019-04-04 NOTE — Progress Notes (Signed)
I have read the note, and I agree with the clinical assessment and plan.  Charles K [name]   

## 2019-04-06 ENCOUNTER — Other Ambulatory Visit: Payer: Self-pay

## 2019-04-06 ENCOUNTER — Telehealth: Payer: Self-pay

## 2019-04-06 DIAGNOSIS — K9089 Other intestinal malabsorption: Secondary | ICD-10-CM | POA: Diagnosis not present

## 2019-04-06 DIAGNOSIS — K580 Irritable bowel syndrome with diarrhea: Secondary | ICD-10-CM | POA: Diagnosis not present

## 2019-04-06 LAB — PRIMIDONE, SERUM
Phenobarbital, Serum: 12 ug/mL — ABNORMAL LOW (ref 15–40)
Primidone Lvl: NOT DETECTED ug/mL (ref 5.0–12.0)

## 2019-04-06 NOTE — Telephone Encounter (Signed)
I called [facility] at [phone]. They needed clarification on the dosage of the phenytoin capsule. I stated per last note and refill phenytoin (DILANTIN) 100 MG ER capsule [id number]   Order Details Dose, Route, Frequency: As Directed  Dispense Quantity: 270 capsule Refills: 3 Fills remaining: --        Sig: Take 1 capsule (100 mg) in the morning and Take 2 capsules (200 mg) at bedtime     The pharmacy department verbalized understanding.

## 2019-04-10 ENCOUNTER — Other Ambulatory Visit: Payer: Self-pay | Admitting: *Deleted

## 2019-04-10 DIAGNOSIS — M1712 Unilateral primary osteoarthritis, left knee: Secondary | ICD-10-CM | POA: Diagnosis not present

## 2019-04-11 ENCOUNTER — Telehealth: Payer: Self-pay | Admitting: *Deleted

## 2019-04-11 NOTE — Telephone Encounter (Signed)
I called pt and she is taking as prescribed now 2 tabs am, 1 tab noon, 1 tab dinner of primidone. Is doing better.  She states she did not take morning of appt as she wanted MM/NP to see how she was.   I relayed the results of her primidone as no level.  Pt verbalized understanding.

## 2019-04-11 NOTE — Telephone Encounter (Signed)
-----   Message from Ward Givens, NP sent at 04/10/2019  1:46 PM EST ----- Please inquire if patient is taking medication- Primidone level is none?

## 2019-04-17 ENCOUNTER — Ambulatory Visit
Admission: RE | Admit: 2019-04-17 | Discharge: 2019-04-17 | Disposition: A | Discharge status: Home / Self Care | Payer: Medicare Other | Source: Ambulatory Visit | Attending: Family Medicine | Admitting: Family Medicine

## 2019-04-17 ENCOUNTER — Other Ambulatory Visit: Payer: Self-pay

## 2019-04-17 DIAGNOSIS — Z1231 Encounter for screening mammogram for malignant neoplasm of breast: Secondary | ICD-10-CM

## 2019-04-17 DIAGNOSIS — M25562 Pain in left knee: Secondary | ICD-10-CM | POA: Diagnosis not present

## 2019-04-19 DIAGNOSIS — I10 Essential (primary) hypertension: Secondary | ICD-10-CM | POA: Diagnosis not present

## 2019-04-19 DIAGNOSIS — R202 Paresthesia of skin: Secondary | ICD-10-CM | POA: Diagnosis not present

## 2019-04-19 DIAGNOSIS — F411 Generalized anxiety disorder: Secondary | ICD-10-CM | POA: Diagnosis not present

## 2019-04-22 DIAGNOSIS — Z03818 Encounter for observation for suspected exposure to other biological agents ruled out: Secondary | ICD-10-CM | POA: Diagnosis not present

## 2019-04-24 ENCOUNTER — Ambulatory Visit: Payer: Medicare Other | Admitting: Neurology

## 2019-04-25 ENCOUNTER — Ambulatory Visit: Payer: Medicare Other | Admitting: Podiatry

## 2019-04-25 ENCOUNTER — Ambulatory Visit (INDEPENDENT_AMBULATORY_CARE_PROVIDER_SITE_OTHER): Payer: Medicare Other | Admitting: Orthotics

## 2019-04-25 ENCOUNTER — Ambulatory Visit (INDEPENDENT_AMBULATORY_CARE_PROVIDER_SITE_OTHER): Payer: Medicare Other | Admitting: Podiatry

## 2019-04-25 ENCOUNTER — Other Ambulatory Visit: Payer: Self-pay

## 2019-04-25 ENCOUNTER — Encounter: Payer: Self-pay | Admitting: Podiatry

## 2019-04-25 DIAGNOSIS — L84 Corns and callosities: Secondary | ICD-10-CM

## 2019-04-25 DIAGNOSIS — M79674 Pain in right toe(s): Secondary | ICD-10-CM

## 2019-04-25 DIAGNOSIS — M79675 Pain in left toe(s): Secondary | ICD-10-CM | POA: Diagnosis not present

## 2019-04-25 DIAGNOSIS — M2042 Other hammer toe(s) (acquired), left foot: Secondary | ICD-10-CM | POA: Diagnosis not present

## 2019-04-25 DIAGNOSIS — Q828 Other specified congenital malformations of skin: Secondary | ICD-10-CM

## 2019-04-25 DIAGNOSIS — E1142 Type 2 diabetes mellitus with diabetic polyneuropathy: Secondary | ICD-10-CM

## 2019-04-25 DIAGNOSIS — E0842 Diabetes mellitus due to underlying condition with diabetic polyneuropathy: Secondary | ICD-10-CM | POA: Diagnosis not present

## 2019-04-25 DIAGNOSIS — B351 Tinea unguium: Secondary | ICD-10-CM | POA: Diagnosis not present

## 2019-04-25 DIAGNOSIS — M2041 Other hammer toe(s) (acquired), right foot: Secondary | ICD-10-CM | POA: Diagnosis not present

## 2019-04-25 DIAGNOSIS — E08621 Diabetes mellitus due to underlying condition with foot ulcer: Secondary | ICD-10-CM

## 2019-04-25 DIAGNOSIS — L97501 Non-pressure chronic ulcer of other part of unspecified foot limited to breakdown of skin: Secondary | ICD-10-CM

## 2019-04-25 NOTE — Progress Notes (Signed)

## 2019-04-25 NOTE — Patient Instructions (Signed)
Diabetes Mellitus and Foot Care Foot care is an important part of your health, especially when you have diabetes. Diabetes Dimmitt cause you to have problems because of poor blood flow (circulation) to your feet and legs, which can cause your skin to:  Become thinner and drier.  Break more easily.  Heal more slowly.  Peel and crack. You Gindlesperger also have nerve damage (neuropathy) in your legs and feet, causing decreased feeling in them. This means that you [place] not notice minor injuries to your feet that could lead to more serious problems. Noticing and addressing any potential problems early is the best way to prevent future foot problems. How to care for your feet Foot hygiene  Wash your feet daily with warm water and mild soap. Do not use hot water. Then, pat your feet and the areas between your toes until they are completely dry. Do not soak your feet as this can dry your skin.  Trim your toenails straight across. Do not dig under them or around the cuticle. File the edges of your nails with an emery board or nail file.  Apply a moisturizing lotion or petroleum jelly to the skin on your feet and to dry, brittle toenails. Use lotion that does not contain alcohol and is unscented. Do not apply lotion between your toes. Shoes and socks  Wear clean socks or stockings every day. Make sure they are not too tight. Do not wear knee-high stockings since they Ryle decrease blood flow to your legs.  Wear shoes that fit properly and have enough cushioning. Always look in your shoes before you put them on to be sure there are no objects inside.  To break in new shoes, wear them for just a few hours a day. This prevents injuries on your feet. Wounds, scrapes, corns, and calluses  Check your feet daily for blisters, cuts, bruises, sores, and redness. If you cannot see the bottom of your feet, use a mirror or ask someone for help.  Do not cut corns or calluses or try to remove them with medicine.  If you  find a minor scrape, cut, or break in the skin on your feet, keep it and the skin around it clean and dry. You Kolinski clean these areas with mild soap and water. Do not clean the area with peroxide, alcohol, or iodine.  If you have a wound, scrape, corn, or callus on your foot, look at it several times a day to make sure it is healing and not infected. Check for: ? Redness, swelling, or pain. ? Fluid or blood. ? Warmth. ? Pus or a bad smell. General instructions  Do not cross your legs. This E[place]olito decrease blood flow to your feet.  Do not use heating pads or hot water bottles on your feet. They Sher burn your skin. If you have lost feeling in your feet or legs, you Berk[place] not know this is happening until it is too late.  Protect your feet from hot and cold by wearing shoes, such as at the beach or on hot pavement.  Schedule a complete foot exam at least once a year (annually) or more often if you have foot problems. If you have foot problems, report any cuts, sores, or bruises to your health care provider immediately. Contact a health care provider if:  You have a medical condition that increases your risk of infection and you have any cuts, sores, or bruises on your feet.  You have an injury that is not   healing.  You have redness on your legs or feet.  You feel burning or tingling in your legs or feet.  You have pain or cramps in your legs and feet.  Your legs or feet are numb.  Your feet always feel cold.  You have pain around a toenail. Get help right away if:  You have a wound, scrape, corn, or callus on your foot and: ? You have pain, swelling, or redness that gets worse. ? You have fluid or blood coming from the wound, scrape, corn, or callus. ? Your wound, scrape, corn, or callus feels warm to the touch. ? You have pus or a bad smell coming from the wound, scrape, corn, or callus. ? You have a fever. ? You have a red line going up your leg. Summary  Check your feet every day  for cuts, sores, red spots, swelling, and blisters.  Moisturize feet and legs daily.  Wear shoes that fit properly and have enough cushioning.  If you have foot problems, report any cuts, sores, or bruises to your health care provider immediately.  Schedule a complete foot exam at least once a year (annually) or more often if you have foot problems. This information is not intended to replace advice given to you by your health care provider. Make sure you discuss any questions you have with your health care provider. Document Released: 04/17/2000 Document Revised: 06/02/2017 Document Reviewed: 05/22/2016 Elsevier Patient Education  2020 [employer]  

## 2019-05-01 NOTE — Progress Notes (Signed)
Subjective: [name] is a 85 y.o. y.o. female with h/o diabetes who presents today for preventative diabetic foot care. Patient has painful, elongated mycotic toenails. She also has h/o partial thickness ulcer submet head 1 right foot which poses a risk and interfere with daily activities. Pain is aggravated when wearing enclosed shoe gear and relieved with periodic professional debridement.  She is also here to pick up diabetic shoes which are in today.  Yolanda Bien, MD is patient's PCP.   Medications reviewed in chart.  Allergies  Allergen Reactions  . Bystolic [Nebivolol Hcl] Other (See Comments)    "extreme weakness, heaviness in legs & arms, swelling in legs/arms/face, swollen abdomen, pain in bladder, feet pain, soreness in chest"  . Cholestyramine     "itching rash on stomach, bloated, nausea, vomiting, sleeplessness, extreme pain in arms"  . Hydrazine Yellow [Tartrazine] Other (See Comments)    "does not reduce high blood pressure, pain in arm, high pressure, felt like I was on verge of heart attack, really weak"  . Morphine Other (See Comments)    Feels morbid, weak, still in pain  . Niacin Palpitations    Fast heart beat  . Niaspan [Niacin Er] Palpitations and Other (See Comments)    "fast heart beat, high blood pressure"  . Norvasc [Amlodipine Besylate] Other (See Comments)    "extreme fluid retention/pain)  . Optivar [Azelastine Hcl] Photosensitivity  . Repaglinide Hives  . Sular [Nisoldipine Er] Other (See Comments)    "severe headaches, swelling eyes, hands, feet, shortness of breath, weak, flushed face, brain boiling, fluid retention, high blood sugar, nervous, heart fast beating"  . Tegretol [Carbamazepine] Other (See Comments)    Blood poisoning   . Telmisartan Other (See Comments)    "headache, difficulty urinating, high blood sugar, fluid retention"  . Amoxicillin-Pot Clavulanate Rash  . Cefdinir Swelling    Vaginal irritation, breathing,   .  Ciprofloxacin Hcl Hives  . Clonidine Other (See Comments)    Dry mouth, fluid retention  . Clonidine Hydrochloride Other (See Comments)    Dry mouth, fluid retention  . Codeine Nausea And Vomiting  . Ezetimibe Other (See Comments)    Made weak  . Hydroxychloroquine Other (See Comments)    Low platlets  . Naproxen Other (See Comments)    Shrinks bladder  . Sulfa Antibiotics Rash  . Ziac [Bisoprolol-Hydrochlorothiazide] Other (See Comments)    "stopped urination"  . Elavil [Amitriptyline] Other (See Comments)    Gave Pt nightmares  . Hydralazine   . Iodine   . Keppra [Levetiracetam] Other (See Comments)    Shaking  . Lamictal [Lamotrigine]     itching  . Lyrica [Pregabalin] Swelling  . Pseudoephedrine   . Topamax [Topiramate]     Dry eyes  . Ace Inhibitors Other (See Comments)    unknown  . Actonel [Risedronate Sodium] Other (See Comments)    unknown  . Amlodipine Besylate Other (See Comments)    unknown  . Aspirin Other (See Comments)    unknown  . Atacand [Candesartan] Other (See Comments)    unknown  . Bextra [Valdecoxib] Other (See Comments)    unknown  . Bisoprolol-Hydrochlorothiazide Other (See Comments)    unknown  . Candesartan Cilexetil Other (See Comments)    unknown  . Cefadroxil Other (See Comments)    unknown  . Celecoxib Rash  . Hydrocodone Other (See Comments)    unknown  . Hydrocodone-Acetaminophen Other (See Comments)    unknown  . Iodinated Diagnostic  Agents Rash    "All over" "All over"  . Meloxicam Other (See Comments)    unknown  . Methylprednisolone Sodium Succinate Other (See Comments)    unknown  . Nabumetone Other (See Comments)    unknown  . Penicillins Other (See Comments)    unknown  . Pseudoephedrine-Guaifenesin Other (See Comments)    unknown  . Risedronate Sodium Other (See Comments)    unknown  . Rofecoxib Other (See Comments)    unknown  . Ru-Tuss [Chlorphen-Pse-Atrop-Hyos-Scop] Other (See Comments)    unknown  .  Sulfonamide Derivatives Other (See Comments)    unknown  . Sulphur [Sulfur] Other (See Comments)    unknown  . Telithromycin Other (See Comments)    unknown  . Terfenadine Other (See Comments)    unknown  . Trandolapril-Verapamil Hcl Er Other (See Comments)    Headache, difficulty urinating, high blood sugar, fluid retention  Pt is taking Tarka (trandolapril-verapamil) currently, but requests the medication stay in her allergy list  . Trandolapril-Verapamil Hcl Er Other (See Comments)    Headache, difficulty urinating, high blood sugar, fluid retention  Pt is taking Tarka (trandolapril-verapamil) currently, but requests the medication stay in her allergy list  . Valium [Diazepam] Other (See Comments)    Makes her mean and hyper    Objective: There were no vitals filed for this visit.  VVascular Examination: Capillary refill time <3 seconds x 10 digits.  Dorsalis pedis pulses faintly palpable b/l.  Posterior tibial pulses faintly palpable b/l.  Digital hair absent b/l.  Skin temperature gradient WNL b/l.  Dermatological Examination: Skin with normal turgor, texture and tone b/l.  Toenails 1-5 b/l discolored, thick, dystrophic with subungual debris and pain with palpation to nailbeds due to thickness of nails.  Porokeratotic lesions submet head 1 b/l, distal tip right 3rd digit and left 2nd digit with subdermal hemorrhage and tenderness to palpation. No erythema, no edema, no drainage, no flocculence.   Musculoskeletal: Muscle strength 5/5 to all LE muscle groups b/l.  Hammertoes 2-5 b/l.  Neurological: Sensation diminished b/l with 10 gram monofilament.  Vibratory sensation diminished b/l.  Last A1c:  Hemoglobin A1C Latest Ref Rng & Units 10/26/2018  HGBA1C 4.8 - 5.6 % 8.4(H)  Some recent data might be hidden   Assessment: 1. Painful onychomycosis toenails 1-5 b/l 2.   Preulcerative lesions submet head 1 b/l, distal tip right 3rd digit and left 2nd digit 3.   Hammertoes 2-5 b/l 4.  NIDDM with neuropathy  Plan: 1. Continue diabetic foot care principles. Literature dispensed on today. 2. Toenails 1-5 b/l were debrided in length and girth without iatrogenic bleeding. 3. Preulcerative lesions submet head 1 b/l, distal tip right 3rd digit and left 2nd digit pared with sterile scalpel blade without incident. 4. Dispensed one pair diabetic shoes and 3 pair total contact insoles. Shoes were appropriate fit with no heel slippage. Reviewed warranty information and patient signed all paperwork stating patient received shoes, insert(s)/filler(s), break-in instructions and warranty information. Patient instructed not to wear shoes outside unless completely satisfied. Patient related understanding. 5. Patient to continue soft, supportive shoe gear daily. 6. Patient to report any pedal injuries to medical professional immediately. 7. Follow up 6 weeks for re-evaluation of diabetic shoes and preulcerative lesions.  8. Patient/POA to call should there be a concern in the interim.

## 2019-05-12 ENCOUNTER — Other Ambulatory Visit: Payer: Self-pay | Admitting: Neurology

## 2019-05-15 DIAGNOSIS — M25562 Pain in left knee: Secondary | ICD-10-CM | POA: Diagnosis not present

## 2019-05-16 ENCOUNTER — Other Ambulatory Visit: Payer: Self-pay | Admitting: *Deleted

## 2019-05-16 MED ORDER — PHENYTOIN SODIUM EXTENDED 100 MG PO CAPS: ORAL_CAPSULE | ORAL | 3 refills | Status: DC

## 2019-05-22 DIAGNOSIS — R569 Unspecified convulsions: Secondary | ICD-10-CM | POA: Diagnosis not present

## 2019-05-22 DIAGNOSIS — R519 Headache, unspecified: Secondary | ICD-10-CM | POA: Diagnosis not present

## 2019-05-22 DIAGNOSIS — F411 Generalized anxiety disorder: Secondary | ICD-10-CM | POA: Diagnosis not present

## 2019-05-22 DIAGNOSIS — I10 Essential (primary) hypertension: Secondary | ICD-10-CM | POA: Diagnosis not present

## 2019-05-25 ENCOUNTER — Ambulatory Visit: Payer: Medicare Other | Attending: Internal Medicine

## 2019-05-25 DIAGNOSIS — Z23 Encounter for immunization: Secondary | ICD-10-CM | POA: Insufficient documentation

## 2019-05-25 NOTE — Progress Notes (Signed)
   Covid-19 Vaccination Clinic  Name:  [name]    MRN: [id number] DOB: 23-Jan-1930  05/25/2019  [name] was observed post Covid-19 immunization for 30 minutes based on pre-vaccination screening without incidence. She was provided with Vaccine Information Sheet and instruction to access the V-Safe system.   [name] was instructed to call 911 with any severe reactions post vaccine: Marland Kitchen Difficulty breathing  . Swelling of your face and throat  . A fast heartbeat  . A bad rash all over your body  . Dizziness and weakness    Immunizations Administered    Name Date Dose VIS Date Route   Pfizer COVID-19 Vaccine 05/25/2019 11:17 AM 0.3 mL 04/14/2019 Intramuscular   Manufacturer: [place]   Lot: [id number]   [place]: [id number]

## 2019-05-29 ENCOUNTER — Telehealth: Payer: Self-pay

## 2019-05-29 NOTE — Telephone Encounter (Signed)
Patent called wanting to speak to someone about her ALPRAZolam (XANAX) 0.25 MG tablet patient was advised Dr.[name] did not prescribe this medication however patient state he spoke to her about the medication and she will not take "no for an answer" and would like to speak to someone about the medication.   Please follow up.

## 2019-05-29 NOTE — Telephone Encounter (Signed)
I called pt and she is needing xanax for anxiety.  She is taking primidone for her tremors.  She thought this would be gotten when her dilantin was sent but it was not.  She is asking for 3 [facility].  I asked her if her pcp prescribes since anxiety, but Dr Jannifer Franklin has done in past.Drug registry check xanax 0.5 last fill 08-01-2018 #180.   Please advise.

## 2019-05-30 MED ORDER — ALPRAZOLAM 0.25 MG PO TABS: 0.2500 mg | ORAL_TABLET | Freq: Every evening | ORAL | 0 refills | Status: DC | PRN

## 2019-05-30 NOTE — Telephone Encounter (Signed)
Refill sent.

## 2019-05-30 NOTE — Telephone Encounter (Signed)
I called pt and relayed that xanax called in.  90 tabs to [facility].  Pt to call us 10 days prior to finishing this to get [facility].  She verbalized understanding.

## 2019-06-06 ENCOUNTER — Encounter: Payer: Self-pay | Admitting: Podiatry

## 2019-06-06 ENCOUNTER — Other Ambulatory Visit: Payer: Self-pay

## 2019-06-06 ENCOUNTER — Ambulatory Visit (INDEPENDENT_AMBULATORY_CARE_PROVIDER_SITE_OTHER): Payer: Medicare Other | Admitting: Podiatry

## 2019-06-06 DIAGNOSIS — E1142 Type 2 diabetes mellitus with diabetic polyneuropathy: Secondary | ICD-10-CM | POA: Diagnosis not present

## 2019-06-06 DIAGNOSIS — L97501 Non-pressure chronic ulcer of other part of unspecified foot limited to breakdown of skin: Secondary | ICD-10-CM

## 2019-06-06 DIAGNOSIS — E08621 Diabetes mellitus due to underlying condition with foot ulcer: Secondary | ICD-10-CM

## 2019-06-06 NOTE — Patient Instructions (Signed)
Corns and Calluses Corns are small areas of thickened skin that occur on the top, sides, or tip of a toe. They contain a cone-shaped core with a point that can press on a nerve below. This causes pain.  Calluses are areas of thickened skin that can occur anywhere on the body, including the hands, fingers, palms, soles of the feet, and heels. Calluses are usually larger than corns. What are the causes? Corns and calluses are caused by rubbing (friction) or pressure, such as from shoes that are too tight or do not fit properly. What increases the risk? Corns are more likely to develop in people who have misshapen toes (toe deformities), such as hammer toes. Calluses can occur with friction to any area of the skin. They are more likely to develop in people who:  Work with their hands.  Wear shoes that fit poorly, are too tight, or are high-heeled.  Have toe deformities. What are the signs or symptoms? Symptoms of a corn or callus include:  A hard growth on the skin.  Pain or tenderness under the skin.  Redness and swelling.  Increased discomfort while wearing tight-fitting shoes, if your feet are affected. If a corn or callus becomes infected, symptoms Vanden[place]uvel include:  Redness and swelling that gets worse.  Pain.  Fluid, blood, or pus draining from the corn or callus. How is this diagnosed? Corns and calluses Harbach be diagnosed based on your symptoms, your medical history, and a physical exam. How is this treated? Treatment for corns and calluses Djordjevic include:  Removing the cause of the friction or pressure. This [place] involve: ? Changing your shoes. ? Wearing shoe inserts (orthotics) or other protective layers in your shoes, such as a corn pad. ? Wearing gloves.  Applying medicine to the skin (topical medicine) to help soften skin in the hardened, thickened areas.  Removing layers of dead skin with a file to reduce the size of the corn or callus.  Removing the corn or callus with a  scalpel or laser.  Taking antibiotic medicines, if your corn or callus is infected.  Having surgery, if a toe deformity is the cause. Follow these instructions at home:   Take over-the-counter and prescription medicines only as told by your health care provider.  If you were prescribed an antibiotic, take it as told by your health care provider. Do not stop taking it even if your condition starts to improve.  Wear shoes that fit well. Avoid wearing high-heeled shoes and shoes that are too tight or too loose.  Wear any padding, protective layers, gloves, or orthotics as told by your health care provider.  Soak your hands or feet and then use a file or pumice stone to soften your corn or callus. Do this as told by your health care provider.  Check your corn or callus every day for symptoms of infection. Contact a health care provider if you:  Notice that your symptoms do not improve with treatment.  Have redness or swelling that gets worse.  Notice that your corn or callus becomes painful.  Have fluid, blood, or pus coming from your corn or callus.  Have new symptoms. Summary  Corns are small areas of thickened skin that occur on the top, sides, or tip of a toe.  Calluses are areas of thickened skin that can occur anywhere on the body, including the hands, fingers, palms, and soles of the feet. Calluses are usually larger than corns.  Corns and calluses are caused by   rubbing (friction) or pressure, such as from shoes that are too tight or do not fit properly.  Treatment [place] include wearing any padding, protective layers, gloves, or orthotics as told by your health care provider. This information is not intended to replace advice given to you by your health care provider. Make sure you discuss any questions you have with your health care provider. Document Revised: 08/10/2018 Document Reviewed: 03/03/2017 Elsevier Patient Education  [place].  Diabetes Mellitus and  [place] care is an important part of your health, especially when you have diabetes. Diabetes Cosgriff cause you to have problems because of poor blood flow (circulation) to your feet and legs, which can cause your skin to:  Become thinner and drier.  Break more easily.  Heal more slowly.  Peel and crack. You Fiebig also have nerve damage (neuropathy) in your legs and feet, causing decreased feeling in them. This means that you Robards not notice minor injuries to your feet that could lead to more serious problems. Noticing and addressing any potential problems early is the best way to prevent future foot problems. How to care for your feet Foot hygiene  Wash your feet daily with warm water and mild soap. Do not use hot water. Then, pat your feet and the areas between your toes until they are completely dry. Do not soak your feet as this can dry your skin.  Trim your toenails straight across. Do not dig under them or around the cuticle. File the edges of your nails with an emery board or nail file.  Apply a moisturizing lotion or petroleum jelly to the skin on your feet and to dry, brittle toenails. Use lotion that does not contain alcohol and is unscented. Do not apply lotion between your toes. Shoes and socks  Wear clean socks or stockings every day. Make sure they are not too tight. Do not wear knee-high stockings since they Wit[place] decrease blood flow to your legs.  Wear shoes that fit properly and have enough cushioning. Always look in your shoes before you put them on to be sure there are no objects inside.  To break in new shoes, wear them for just a few hours a day. This prevents injuries on your feet. Wounds, scrapes, corns, and calluses  Check your feet daily for blisters, cuts, bruises, sores, and redness. If you cannot see the bottom of your feet, use a mirror or ask someone for help.  Do not cut corns or calluses or try to remove them with medicine.  If you find a minor scrape,  cut, or break in the skin on your feet, keep it and the skin around it clean and dry. You Faro clean these areas with mild soap and water. Do not clean the area with peroxide, alcohol, or iodine.  If you have a wound, scrape, corn, or callus on your foot, look at it several times a day to make sure it is healing and not infected. Check for: ? Redness, swelling, or pain. ? Fluid or blood. ? Warmth. ? Pus or a bad smell. General instructions  Do not cross your legs. This Roszak decrease blood flow to your feet.  Do not use heating pads or hot water bottles on your feet. They Thivierge burn your skin. If you have lost feeling in your feet or legs, you Adelson not know this is happening until it is too late.  Protect your feet from hot and cold by wearing shoes, such as at  the beach or on hot pavement.  Schedule a complete foot exam at least once a year (annually) or more often if you have foot problems. If you have foot problems, report any cuts, sores, or bruises to your health care provider immediately. Contact a health care provider if:  You have a medical condition that increases your risk of infection and you have any cuts, sores, or bruises on your feet.  You have an injury that is not healing.  You have redness on your legs or feet.  You feel burning or tingling in your legs or feet.  You have pain or cramps in your legs and feet.  Your legs or feet are numb.  Your feet always feel cold.  You have pain around a toenail. Get help right away if:  You have a wound, scrape, corn, or callus on your foot and: ? You have pain, swelling, or redness that gets worse. ? You have fluid or blood coming from the wound, scrape, corn, or callus. ? Your wound, scrape, corn, or callus feels warm to the touch. ? You have pus or a bad smell coming from the wound, scrape, corn, or callus. ? You have a fever. ? You have a red line going up your leg. Summary  Check your feet every day for cuts, sores, red  spots, swelling, and blisters.  Moisturize feet and legs daily.  Wear shoes that fit properly and have enough cushioning.  If you have foot problems, report any cuts, sores, or bruises to your health care provider immediately.  Schedule a complete foot exam at least once a year (annually) or more often if you have foot problems. This information is not intended to replace advice given to you by your health care provider. Make sure you discuss any questions you have with your health care provider. Document Revised: 01/11/2019 Document Reviewed: 05/22/2016 Elsevier Patient Education  [place].

## 2019-06-08 ENCOUNTER — Telehealth: Payer: Self-pay | Admitting: Adult Health

## 2019-06-08 NOTE — Telephone Encounter (Signed)
[place] @ [facility] [place] has called stating pt reached out to them for a refill on her ALPRAZolam (XANAX) 0.25 MG tablet

## 2019-06-08 NOTE — Telephone Encounter (Signed)
I called the patient.  She just got her Xanax prescription filled on 30 May 2019 we cannot give another refill for Xanax now.  The patient claims that she is taking at x1 twice during the day, the prescription is only written for 1 at night, the patient in the past has had the prescription written for 1 twice a day if needed.  The patient will call our office when she needs the next refill, we will send the prescription into the mail order pharmacy, [place]. The most recent prescription went into the local pharmacy.

## 2019-06-08 NOTE — Telephone Encounter (Signed)
I called pt and she stated that she get the xanax 0.25mg  tabs for 90 day supply and takes 1-3 per day prn.  She is [facility].  Drug registry last fill #90 one tabl po qhs anxiety 05-30-19.  Please advise.  She see's pain management for lidoderm patches and tramadol.

## 2019-06-10 NOTE — Progress Notes (Signed)
-    Subjective:  Patient ID: [name], female    DOB: 12/01/1929,  MRN: [id number]  Chief Complaint  Patient presents with  . Diabetes    A1C 8.4, 6 week follow up  . Callouses    preulcerative callus right hallux  . Nail Problem    bilateral thick painful toenails   She presents with her diabetic shoes and voices no issues with them on today's visit.  She states she has been wearing toe tunnel on right foot, but it slips off into her socks most times.  86 y.o. female presents for wound care. Hx confirmed with patient.  Objective:  Physical Exam: Wound Location: right submet head 1 (healed) Wound Measurement: 1.0 cm in diameter Wound Base: hyperkeratotic with no subdermal hemorrhage Peri-wound: Normal Exudate: None: wound tissue dry  Ulceration located distal tip right 4th digit.  Predebridement measurements carried out today of 0.5 cm in diameter.  No probing to bone, no undermining, no active pus or purulence noted.  No deep abscess, no edema, no cellulitis, no odor was encountered.    Postdebridement measurements today are: 0.6 x 0.4 x 0.1 cm.  No erythema or lymphangitis noted.  No probing to bone, no undermining, no active pus or purulence noted.  No deep abscess, no erythema, no edema,  no odor was encountered.  6 week Diabetic Shoe/Insert Check: Shoes with adequate length/width. No erythema noted to bony prominences. Inserts have offloading for submet heads 1, 5 b/l. Plantar lesions with improvement in appearance as evidenced by no visible subdermal hemorrhage under calluses today.  Assessment:   1. Diabetic ulcer of foot associated with diabetes mellitus due to underlying condition, limited to breakdown of skin, unspecified laterality, unspecified part of foot ([place])   2. Diabetic peripheral neuropathy associated with type 2 diabetes mellitus ([place])     Plan:  Patient was evaluated and treated and all questions answered. -Diabetic Shoes inspected on today's  visit. -Continue diabetic foot care principles. Literature dispensed on today.  -Continue diabetic shoes daily -Patient to report any pedal injuries to medical professional immediately. Ulcer was debrided of nonviable necrotic tissue and was resected to the level of dermis. Ulcer was cleansed with wound cleanser. Triple antibiotic ointment was applied to base of wound with light dressing. She Dolman remove on tomorrow. Continue toe tunnel daily. We did discuss possible in-office tenotomy of right digit. Will have Dr. Leigh Aurora input on next visit to determine if this is an option to prevent further ulcerations. -Patient instructed to report to emergency department with worsening appearance of ulcer/toe/foot, increased pain, foul odor, increased redness, swelling, drainage, fever, chills, nightsweats, nausea, vomiting, increased blood sugar.  -Patient/POA to call should there be question/concern in the interim.  Return in about 3 weeks (around 06/27/2019) for diabetic nail and callus trim/ Eliquis.

## 2019-06-12 DIAGNOSIS — M1712 Unilateral primary osteoarthritis, left knee: Secondary | ICD-10-CM | POA: Diagnosis not present

## 2019-06-14 ENCOUNTER — Ambulatory Visit: Payer: Medicare Other | Attending: Internal Medicine

## 2019-06-14 DIAGNOSIS — Z23 Encounter for immunization: Secondary | ICD-10-CM | POA: Insufficient documentation

## 2019-06-14 NOTE — Progress Notes (Signed)
   Covid-19 Vaccination Clinic  Name:  [name]    MRN: [id number] DOB: 1929/05/30  06/14/2019  [name] was observed post Covid-19 immunization for 30 minutes based on pre-vaccination screening without incidence. She was provided with Vaccine Information Sheet and instruction to access the V-Safe system.   [name] was instructed to call 911 with any severe reactions post vaccine: Marland Kitchen Difficulty breathing  . Swelling of your face and throat  . A fast heartbeat  . A bad rash all over your body  . Dizziness and weakness    Immunizations Administered    Name Date Dose VIS Date Route   Pfizer COVID-19 Vaccine 06/14/2019 10:23 AM 0.3 mL 04/14/2019 Intramuscular   Manufacturer: [place]   Lot: [id number]   [place]: [id number]

## 2019-06-19 DIAGNOSIS — M25562 Pain in left knee: Secondary | ICD-10-CM | POA: Diagnosis not present

## 2019-06-19 DIAGNOSIS — M8580 Other specified disorders of bone density and structure, unspecified site: Secondary | ICD-10-CM | POA: Diagnosis not present

## 2019-06-19 DIAGNOSIS — M546 Pain in thoracic spine: Secondary | ICD-10-CM | POA: Diagnosis not present

## 2019-06-19 DIAGNOSIS — S32001A Stable burst fracture of unspecified lumbar vertebra, initial encounter for closed fracture: Secondary | ICD-10-CM | POA: Diagnosis not present

## 2019-06-19 DIAGNOSIS — I10 Essential (primary) hypertension: Secondary | ICD-10-CM | POA: Diagnosis not present

## 2019-06-20 DIAGNOSIS — E1165 Type 2 diabetes mellitus with hyperglycemia: Secondary | ICD-10-CM | POA: Diagnosis not present

## 2019-06-20 DIAGNOSIS — Z794 Long term (current) use of insulin: Secondary | ICD-10-CM | POA: Diagnosis not present

## 2019-06-20 DIAGNOSIS — Q782 Osteopetrosis: Secondary | ICD-10-CM | POA: Diagnosis not present

## 2019-06-26 DIAGNOSIS — M810 Age-related osteoporosis without current pathological fracture: Secondary | ICD-10-CM | POA: Diagnosis not present

## 2019-06-26 DIAGNOSIS — M25562 Pain in left knee: Secondary | ICD-10-CM | POA: Diagnosis not present

## 2019-06-26 DIAGNOSIS — Z794 Long term (current) use of insulin: Secondary | ICD-10-CM | POA: Diagnosis not present

## 2019-06-26 DIAGNOSIS — E039 Hypothyroidism, unspecified: Secondary | ICD-10-CM | POA: Diagnosis not present

## 2019-06-26 DIAGNOSIS — E1165 Type 2 diabetes mellitus with hyperglycemia: Secondary | ICD-10-CM | POA: Diagnosis not present

## 2019-06-27 ENCOUNTER — Ambulatory Visit (INDEPENDENT_AMBULATORY_CARE_PROVIDER_SITE_OTHER): Payer: Medicare Other | Admitting: Podiatry

## 2019-06-27 ENCOUNTER — Other Ambulatory Visit: Payer: Self-pay

## 2019-06-27 ENCOUNTER — Encounter: Payer: Self-pay | Admitting: Podiatry

## 2019-06-27 DIAGNOSIS — Z8631 Personal history of diabetic foot ulcer: Secondary | ICD-10-CM

## 2019-06-27 DIAGNOSIS — E1142 Type 2 diabetes mellitus with diabetic polyneuropathy: Secondary | ICD-10-CM

## 2019-06-27 NOTE — Patient Instructions (Signed)
Diabetes Mellitus and Foot Care Foot care is an important part of your health, especially when you have diabetes. Diabetes Esselman cause you to have problems because of poor blood flow (circulation) to your feet and legs, which can cause your skin to:  Become thinner and drier.  Break more easily.  Heal more slowly.  Peel and crack. You Spaid also have nerve damage (neuropathy) in your legs and feet, causing decreased feeling in them. This means that you Zaucha not notice minor injuries to your feet that could lead to more serious problems. Noticing and addressing any potential problems early is the best way to prevent future foot problems. How to care for your feet Foot hygiene  Wash your feet daily with warm water and mild soap. Do not use hot water. Then, pat your feet and the areas between your toes until they are completely dry. Do not soak your feet as this can dry your skin.  Trim your toenails straight across. Do not dig under them or around the cuticle. File the edges of your nails with an emery board or nail file.  Apply a moisturizing lotion or petroleum jelly to the skin on your feet and to dry, brittle toenails. Use lotion that does not contain alcohol and is unscented. Do not apply lotion between your toes. Shoes and socks  Wear clean socks or stockings every day. Make sure they are not too tight. Do not wear knee-high stockings since they Hanger decrease blood flow to your legs.  Wear shoes that fit properly and have enough cushioning. Always look in your shoes before you put them on to be sure there are no objects inside.  To break in new shoes, wear them for just a few hours a day. This prevents injuries on your feet. Wounds, scrapes, corns, and calluses  Check your feet daily for blisters, cuts, bruises, sores, and redness. If you cannot see the bottom of your feet, use a mirror or ask someone for help.  Do not cut corns or calluses or try to remove them with medicine.  If you  find a minor scrape, cut, or break in the skin on your feet, keep it and the skin around it clean and dry. You Lortie clean these areas with mild soap and water. Do not clean the area with peroxide, alcohol, or iodine.  If you have a wound, scrape, corn, or callus on your foot, look at it several times a day to make sure it is healing and not infected. Check for: ? Redness, swelling, or pain. ? Fluid or blood. ? Warmth. ? Pus or a bad smell. General instructions  Do not cross your legs. This Filice decrease blood flow to your feet.  Do not use heating pads or hot water bottles on your feet. They Neace burn your skin. If you have lost feeling in your feet or legs, you Loja not know this is happening until it is too late.  Protect your feet from hot and cold by wearing shoes, such as at the beach or on hot pavement.  Schedule a complete foot exam at least once a year (annually) or more often if you have foot problems. If you have foot problems, report any cuts, sores, or bruises to your health care provider immediately. Contact a health care provider if:  You have a medical condition that increases your risk of infection and you have any cuts, sores, or bruises on your feet.  You have an injury that is not   healing.  You have redness on your legs or feet.  You feel burning or tingling in your legs or feet.  You have pain or cramps in your legs and feet.  Your legs or feet are numb.  Your feet always feel cold.  You have pain around a toenail. Get help right away if:  You have a wound, scrape, corn, or callus on your foot and: ? You have pain, swelling, or redness that gets worse. ? You have fluid or blood coming from the wound, scrape, corn, or callus. ? Your wound, scrape, corn, or callus feels warm to the touch. ? You have pus or a bad smell coming from the wound, scrape, corn, or callus. ? You have a fever. ? You have a red line going up your leg. Summary  Check your feet every day  for cuts, sores, red spots, swelling, and blisters.  Moisturize feet and legs daily.  Wear shoes that fit properly and have enough cushioning.  If you have foot problems, report any cuts, sores, or bruises to your health care provider immediately.  Schedule a complete foot exam at least once a year (annually) or more often if you have foot problems. This information is not intended to replace advice given to you by your health care provider. Make sure you discuss any questions you have with your health care provider. Document Revised: 01/11/2019 Document Reviewed: 05/22/2016 Elsevier Patient Education  2020 [employer]  

## 2019-06-29 NOTE — Progress Notes (Signed)
Subjective: [name] presents today for follow up of follow up ulcer distal tip right 4th digit. She says toe feels much better with use of toe tunnel.  She denies any increased redness, drainage or swelling of digit.   Allergies  Allergen Reactions  . Bystolic [Nebivolol Hcl] Other (See Comments)    "extreme weakness, heaviness in legs & arms, swelling in legs/arms/face, swollen abdomen, pain in bladder, feet pain, soreness in chest"  . Cholestyramine     "itching rash on stomach, bloated, nausea, vomiting, sleeplessness, extreme pain in arms"  . Hydrazine Yellow [Tartrazine] Other (See Comments)    "does not reduce high blood pressure, pain in arm, high pressure, felt like I was on verge of heart attack, really weak"  . Morphine Other (See Comments)    Feels morbid, weak, still in pain  . Niacin Palpitations    Fast heart beat  . Niaspan [Niacin Er] Palpitations and Other (See Comments)    "fast heart beat, high blood pressure"  . Norvasc [Amlodipine Besylate] Other (See Comments)    "extreme fluid retention/pain)  . Optivar [Azelastine Hcl] Photosensitivity  . Repaglinide Hives  . Sular [Nisoldipine Er] Other (See Comments)    "severe headaches, swelling eyes, hands, feet, shortness of breath, weak, flushed face, brain boiling, fluid retention, high blood sugar, nervous, heart fast beating"  . Tegretol [Carbamazepine] Other (See Comments)    Blood poisoning   . Telmisartan Other (See Comments)    "headache, difficulty urinating, high blood sugar, fluid retention"  . Amoxicillin-Pot Clavulanate Rash  . Cefdinir Swelling    Vaginal irritation, breathing,   . Ciprofloxacin Hcl Hives  . Clonidine Other (See Comments)    Dry mouth, fluid retention  . Clonidine Hydrochloride Other (See Comments)    Dry mouth, fluid retention  . Codeine Nausea And Vomiting  . Ezetimibe Other (See Comments)    Made weak  . Hydroxychloroquine Other (See Comments)    Low platlets  . Naproxen Other  (See Comments)    Shrinks bladder  . Sulfa Antibiotics Rash  . Ziac [Bisoprolol-Hydrochlorothiazide] Other (See Comments)    "stopped urination"  . Azelastine Other (See Comments)  . Elavil [Amitriptyline] Other (See Comments)    Gave Pt nightmares  . Empagliflozin Other (See Comments)    "Caused yeast infection, slowed my urine"  . Hydralazine   . Iodine   . Keppra [Levetiracetam] Other (See Comments)    Shaking  . Lamictal [Lamotrigine]     itching  . Lyrica [Pregabalin] Swelling  . Pseudoephedrine   . Topamax [Topiramate]     Dry eyes  . Ace Inhibitors Other (See Comments)    unknown  . Actonel [Risedronate Sodium] Other (See Comments)    unknown  . Amlodipine Besylate Other (See Comments)    unknown  . Aspirin Other (See Comments)    unknown  . Atacand [Candesartan] Other (See Comments)    unknown  . Bextra [Valdecoxib] Other (See Comments)    unknown  . Bisoprolol-Hydrochlorothiazide Other (See Comments)    unknown  . Candesartan Cilexetil Other (See Comments)    unknown  . Cefadroxil Other (See Comments)    unknown  . Celecoxib Rash  . Hydrocodone Other (See Comments)    unknown  . Hydrocodone-Acetaminophen Other (See Comments)    unknown  . Iodinated Diagnostic Agents Rash    "All over" "All over"  . Meloxicam Other (See Comments)    unknown  . Methylprednisolone Sodium Succinate Other (See Comments)  unknown  . Nabumetone Other (See Comments)    unknown  . Penicillins Other (See Comments)    unknown  . Pseudoephedrine-Guaifenesin Other (See Comments)    unknown  . Risedronate Sodium Other (See Comments)    unknown  . Rofecoxib Other (See Comments)    unknown  . Ru-Tuss [Chlorphen-Pse-Atrop-Hyos-Scop] Other (See Comments)    unknown  . Sulfonamide Derivatives Other (See Comments)    unknown  . Sulphur [Sulfur] Other (See Comments)    unknown  . Telithromycin Other (See Comments)    unknown  . Terfenadine Other (See Comments)    unknown  .  Trandolapril-Verapamil Hcl Er Other (See Comments)    Headache, difficulty urinating, high blood sugar, fluid retention  Pt is taking Tarka (trandolapril-verapamil) currently, but requests the medication stay in her allergy list  . Trandolapril-Verapamil Hcl Er Other (See Comments)    Headache, difficulty urinating, high blood sugar, fluid retention  Pt is taking Tarka (trandolapril-verapamil) currently, but requests the medication stay in her allergy list  . Valium [Diazepam] Other (See Comments)    Makes her mean and hyper    Objective: There were no vitals filed for this visit.  Vascular Examination:  Capillary fill time to digits <3s b/l, palpable DP pulses b/l, palpable PT pulses b/l, faintly palpable pedal pulses b/l, pedal hair absent b/l and skin temperature gradient within normal limits b/l  Dermatological Examination: Pedal skin with normal turgor, texture and tone bilaterally, no open wounds bilaterally, no interdigital macerations bilaterally and hyperkeratotic lesion distal tip right 4th digit with subdermal hemorrhage. No erythema, no edema, no drainage, no flocculence.   Musculoskeletal: Normal muscle strength 5/5 to all lower extremity muscle groups bilaterally, no pain crepitus or joint limitation noted with ROM b/l and hammertoes noted to the  2-5 bilaterally  Neurological: Protective sensation absent with 10g monofilament b/l and vibratory sensation absent b/l  Assessment: 1. Healed diabetic ulcer of foot   2. Diabetic peripheral neuropathy associated with type 2 diabetes mellitus (HCC)    Plan: -Ulcer pared without complication right 4th digit. -Digit is healed. Patient to continue to use toe tunnel right 4th digit daily.  -Patient to continue soft, supportive shoe gear daily. -Patient to report any pedal injuries to medical professional immediately. -She is to follow up in 9 weeks. If she has any problems, she was given [name]'s business card to call  office if she has any problems. -Patient/POA to call should there be question/concern in the interim.  Return in about 9 weeks (around 08/29/2019) for diabetic nail and callus trim.

## 2019-06-30 ENCOUNTER — Other Ambulatory Visit (HOSPITAL_COMMUNITY): Payer: Self-pay | Admitting: *Deleted

## 2019-07-03 ENCOUNTER — Encounter (HOSPITAL_COMMUNITY): Payer: Medicare Other

## 2019-07-11 DIAGNOSIS — M25562 Pain in left knee: Secondary | ICD-10-CM | POA: Diagnosis not present

## 2019-07-17 DIAGNOSIS — M25562 Pain in left knee: Secondary | ICD-10-CM | POA: Diagnosis not present

## 2019-07-20 ENCOUNTER — Other Ambulatory Visit: Payer: Self-pay

## 2019-07-20 MED ORDER — PHENYTOIN 50 MG PO CHEW: CHEWABLE_TABLET | ORAL | 3 refills | Status: DC

## 2019-07-24 ENCOUNTER — Telehealth: Payer: Self-pay | Admitting: Adult Health

## 2019-07-24 DIAGNOSIS — M1712 Unilateral primary osteoarthritis, left knee: Secondary | ICD-10-CM | POA: Diagnosis not present

## 2019-07-24 NOTE — Telephone Encounter (Signed)
1) Medication(s) Requested (by name): ALPRAZolam (XANAX) 0.25 MG tablet   2) [facility] Choice: [facility] 3) Special Requests:  Pharmacy called requesting the prescription for the alprazolam   Cb#[phone]  Ref#[id number] [place]

## 2019-07-24 NOTE — Telephone Encounter (Signed)
I see the note per Dr. Jannifer Franklin, No sure if the xanax is to be increased to BID?  Last fill 05-30-19 #90 [facility] one at [facility].

## 2019-07-25 ENCOUNTER — Telehealth: Payer: Self-pay | Admitting: Adult Health

## 2019-07-25 DIAGNOSIS — M25562 Pain in left knee: Secondary | ICD-10-CM | POA: Diagnosis not present

## 2019-07-25 NOTE — Telephone Encounter (Signed)
Can we call patient to see how often she is taking Xanax. In my last note she reported that she was only taking at bedtime to help with sleep?

## 2019-07-25 NOTE — Telephone Encounter (Signed)
Pt called wanting to know if she can increase the amount of primidone (MYSOLINE) 50 MG tablet she takes a day. She states that the dosage she takes in the morning does not last her all day. She would like to know if she can take three in the am and three in the afternoon because her tremors are getting real bad and she can hardly feed herself. Please advise.

## 2019-07-25 NOTE — Telephone Encounter (Signed)
I offered tomorrow at 0930 but was not able to come,  Next was available was 08-08-19 at 1300 on office.  She will come then.

## 2019-07-25 NOTE — Telephone Encounter (Signed)
If she has fallen I am concerned about increasing xanax or primidone. If she feels that tremor is worse. Let's get her in for a sooner office visit or virtual visit

## 2019-07-25 NOTE — Telephone Encounter (Signed)
I called pt.  She is taking she says xanax 1 tab po q[facility], then 3 x / month extra that she will take in am.  But then she noted she was out of the prescription.  Also c/o she has fallen.  I see another message about pprimidone.  She takes 3 tabs am, and is wanting to increase to to 3 tabs at noon, then 1 at bedtime.  She is very tremory.  [facility]

## 2019-08-02 ENCOUNTER — Telehealth: Payer: Self-pay | Admitting: Adult Health

## 2019-08-02 NOTE — Telephone Encounter (Signed)
I called pt about wanting to speak with MD. I stated the nurses call pt and consult with MD or NP. Pt stated she did not want to speak to [facility] NP because she will not increase her xanax or primodine. PT stated her friend had a stroke and she is stress and wants the xanax increase to take one during the day. Pt is currently taking xanax at [facility]. PT wanted MEgan NP to increase her primodine to 3 tabs in the am and 3 tabs at lunch for her tremors. Megan NP note states for pt to take primidone 2 tabs in the am, 1 at lunch and 1 at dinner. I stated Megan NP telephone note stated she has had fallen. I stated message will be sent to Dr. Jannifer Franklin and Jinny Blossom NP for evaluation.She verbalized understanding. Pt has an appt on 08/08/2019 with MEgan NP.

## 2019-08-02 NOTE — Telephone Encounter (Signed)
I spoke with MEgan NP that pt wants xanax increase to take one during the day and at [facility].and increasing her primodine.Megan NP stated pt has an appt with Dr. Jannifer Franklin on 08/08/2019 at 0100pm to be evaluated for increase tremors. She stated to notify Dr. Jannifer Franklin on pts request.

## 2019-08-02 NOTE — Telephone Encounter (Signed)
Pt called needing to discuss her medications with the provider. Pt requested to speak to MD. Please advise.

## 2019-08-03 ENCOUNTER — Telehealth: Payer: Self-pay | Admitting: Neurology

## 2019-08-03 NOTE — Telephone Encounter (Signed)
I called and talk with the patient.  She has had 3 falls since the summer 2020.  She will need to have blood work to ensure she is not toxic on the primidone and Dilantin.  I doubt that the alprazolam is really adding much to her gait instability.  Eliquis is on her medication list but she claims that she is not on this medication.

## 2019-08-03 NOTE — Telephone Encounter (Signed)
I called the patient.  The patient apparently fell in August 2020, she claims that she has had 2 falls since then.  She has had increase in her tremors, she wants to go up on the primidone.  She has started going to 3 tablets twice daily, I indicated that this is too much of an increase, she can go up by 50 mg in the morning and 150 mg in the evening for now and gradually increase the morning dose.  If the patient is having some problems with gait instability, we need to get blood levels of the primidone, phenobarbital and Dilantin.  We have to ensure that the Dilantin is not toxic and causing some gait instability.  She is on low-dose alprazolam taking 0.25 mg in the evenings, this is a fairly low dose.  I indicated that the patient does need to keep her appointment next week and she will need blood work.

## 2019-08-08 ENCOUNTER — Ambulatory Visit (INDEPENDENT_AMBULATORY_CARE_PROVIDER_SITE_OTHER): Payer: Medicare Other | Admitting: Adult Health

## 2019-08-08 ENCOUNTER — Other Ambulatory Visit: Payer: Self-pay

## 2019-08-08 ENCOUNTER — Encounter: Payer: Self-pay | Admitting: Adult Health

## 2019-08-08 VITALS — BP 156/76 | HR 79 | Temp 97.4°F | Ht 67.0 in | Wt 140.0 lb

## 2019-08-08 DIAGNOSIS — G250 Essential tremor: Secondary | ICD-10-CM

## 2019-08-08 DIAGNOSIS — M545 Low back pain: Secondary | ICD-10-CM | POA: Diagnosis not present

## 2019-08-08 DIAGNOSIS — R569 Unspecified convulsions: Secondary | ICD-10-CM | POA: Diagnosis not present

## 2019-08-08 DIAGNOSIS — Z5181 Encounter for therapeutic drug level monitoring: Secondary | ICD-10-CM | POA: Diagnosis not present

## 2019-08-08 DIAGNOSIS — M546 Pain in thoracic spine: Secondary | ICD-10-CM | POA: Diagnosis not present

## 2019-08-08 MED ORDER — ALPRAZOLAM 0.25 MG PO TABS: ORAL_TABLET | ORAL | 0 refills | Status: DC

## 2019-08-08 NOTE — Progress Notes (Signed)
PATIENT: [name] DOB: 1929/12/18  REASON FOR VISIT: follow up HISTORY FROM: patient  HISTORY OF PRESENT ILLNESS: Today 08/08/19:  [name] is an 86 year old female with a history of essential tremor.  She returns today for follow-up.  She recently spoke to Dr. Jannifer Franklin and he changed her primidone to taking 1 tablet in the morning and 3 tablets in the evening.  She remains on the same dose of Dilantin.  She states that she has had several falls.  She is in physical therapy.  She does not use a cane or walker.  Fortunately she is not suffered any significant injuries.  She reports that she takes Xanax 0.25 mg at bedtime consistently.  She reports on occasion she will have an anxiety attack during the day.  She states that typically she will take 1 tablet of Xanax and go to bed.  She states that this [name] happen 1-2 times a week.  She does feel that her tremors are worse.  She returns today for an evaluation.  HISTORY  04/04/19:  [name] is an 86 year old female with a history of essential tremor.  She returns today for follow-up.  She states that she had a fall back in August and since then her tremors have been worse.  She did go to the ED and a CT scan of the head was completed.  The CT was relatively unremarkable.  She states the tremors are in both hands.  She notices that with most activities.  She states that she has a hard time holding onto objects.  She is currently taking primidone 100 mg in the morning and 100 mg in the evening.  She also has Xanax but only takes it at bedtime to help with sleep.  She states that when she fell she was emptying the dishwasher and fell backwards.  She returns today for follow-up.   REVIEW OF SYSTEMS: Out of a complete 14 system review of symptoms, the patient complains only of the following symptoms, and all other reviewed systems are negative.  See HPI  ALLERGIES: Allergies  Allergen Reactions  . Bystolic [Nebivolol Hcl] Other (See Comments)   "extreme weakness, heaviness in legs & arms, swelling in legs/arms/face, swollen abdomen, pain in bladder, feet pain, soreness in chest"  . Cholestyramine     "itching rash on stomach, bloated, nausea, vomiting, sleeplessness, extreme pain in arms"  . Hydrazine Yellow [Tartrazine] Other (See Comments)    "does not reduce high blood pressure, pain in arm, high pressure, felt like I was on verge of heart attack, really weak"  . Morphine Other (See Comments)    Feels morbid, weak, still in pain  . Niacin Palpitations    Fast heart beat  . Niaspan [Niacin Er] Palpitations and Other (See Comments)    "fast heart beat, high blood pressure"  . Norvasc [Amlodipine Besylate] Other (See Comments)    "extreme fluid retention/pain)  . Optivar [Azelastine Hcl] Photosensitivity  . Repaglinide Hives  . Sular [Nisoldipine Er] Other (See Comments)    "severe headaches, swelling eyes, hands, feet, shortness of breath, weak, flushed face, brain boiling, fluid retention, high blood sugar, nervous, heart fast beating"  . Tegretol [Carbamazepine] Other (See Comments)    Blood poisoning   . Telmisartan Other (See Comments)    "headache, difficulty urinating, high blood sugar, fluid retention"  . Amoxicillin-Pot Clavulanate Rash  . Cefdinir Swelling    Vaginal irritation, breathing,   . Ciprofloxacin Hcl Hives  . Clonidine  Other (See Comments)    Dry mouth, fluid retention  . Clonidine Hydrochloride Other (See Comments)    Dry mouth, fluid retention  . Codeine Nausea And Vomiting  . Ezetimibe Other (See Comments)    Made weak  . Hydroxychloroquine Other (See Comments)    Low platlets  . Naproxen Other (See Comments)    Shrinks bladder  . Sulfa Antibiotics Rash  . Ziac [Bisoprolol-Hydrochlorothiazide] Other (See Comments)    "stopped urination"  . Azelastine Other (See Comments)  . Elavil [Amitriptyline] Other (See Comments)    Gave Pt nightmares  . Empagliflozin Other (See Comments)    "Caused  yeast infection, slowed my urine"  . Hydralazine   . Iodine   . Kenalog [Triamcinolone] Diarrhea  . Keppra [Levetiracetam] Other (See Comments)    Shaking  . Lamictal [Lamotrigine]     itching  . Lyrica [Pregabalin] Swelling  . Pseudoephedrine   . Topamax [Topiramate]     Dry eyes  . Ace Inhibitors Other (See Comments)    unknown  . Actonel [Risedronate Sodium] Other (See Comments)    unknown  . Amlodipine Besylate Other (See Comments)    unknown  . Aspirin Other (See Comments)    unknown  . Atacand [Candesartan] Other (See Comments)    unknown  . Bextra [Valdecoxib] Other (See Comments)    unknown  . Bisoprolol-Hydrochlorothiazide Other (See Comments)    unknown  . Candesartan Cilexetil Other (See Comments)    unknown  . Cefadroxil Other (See Comments)    unknown  . Celecoxib Rash  . Hydrocodone Other (See Comments)    unknown  . Hydrocodone-Acetaminophen Other (See Comments)    unknown  . Iodinated Diagnostic Agents Rash    "All over" "All over"  . Meloxicam Other (See Comments)    unknown  . Methylprednisolone Sodium Succinate Other (See Comments)    unknown  . Nabumetone Other (See Comments)    unknown  . Penicillins Other (See Comments)    unknown  . Pseudoephedrine-Guaifenesin Other (See Comments)    unknown  . Risedronate Sodium Other (See Comments)    unknown  . Rofecoxib Other (See Comments)    unknown  . Ru-Tuss [Chlorphen-Pse-Atrop-Hyos-Scop] Other (See Comments)    unknown  . Sulfonamide Derivatives Other (See Comments)    unknown  . Sulphur [Sulfur] Other (See Comments)    unknown  . Telithromycin Other (See Comments)    unknown  . Terfenadine Other (See Comments)    unknown  . Trandolapril-Verapamil Hcl Er Other (See Comments)    Headache, difficulty urinating, high blood sugar, fluid retention  Pt is taking Tarka (trandolapril-verapamil) currently, but requests the medication stay in her allergy list  . Trandolapril-Verapamil Hcl Er  Other (See Comments)    Headache, difficulty urinating, high blood sugar, fluid retention  Pt is taking Tarka (trandolapril-verapamil) currently, but requests the medication stay in her allergy list  . Valium [Diazepam] Other (See Comments)    Makes her mean and hyper    HOME MEDICATIONS: Outpatient Medications Prior to Visit  Medication Sig Dispense Refill  . ALPRAZolam (XANAX) 0.25 MG tablet Take 1 tablet (0.25 mg total) by mouth at bedtime as needed for anxiety. 90 tablet 0  . apixaban (ELIQUIS) 2.5 MG TABS tablet Take by mouth.    . Biotin 1000 MCG tablet Take 1,000 mcg by mouth daily.     . Blood Glucose Monitoring Suppl (PRODIGY VOICE BLOOD GLUCOSE) w/Device KIT Use to check blood sugar 1 time per  day. 1 each 2  . calcitonin, salmon, (MIACALCIN/FORTICAL) 200 UNIT/ACT nasal spray     . Cholecalciferol 5000 units capsule Take 10,000 Units by mouth 2 (two) times a day.     . cholestyramine (QUESTRAN) 4 g packet     . colesevelam (WELCHOL) 625 MG tablet colesevelam 625 mg tablet    . cyanocobalamin (,VITAMIN B-12,) 1000 MCG/ML injection INJECT 1 ML INTRAMUSCULARLY EVERY 21 DAYS (Patient taking differently: Inject 1,000 mcg into the muscle every 21 ( twenty-one) days. ) 3 mL 2  . diclofenac (VOLTAREN) 75 MG EC tablet diclofenac sodium 75 mg tablet,delayed release  TAKE 1 TABLET BY MOUTH TWICE DAILY    . diclofenac Sodium (VOLTAREN) 1 % GEL     . diltiazem (TIAZAC) 180 MG 24 hr capsule Take by mouth.    . DiphenhydrAMINE HCl (BENADRYL ALLERGY PO) Take 1 tablet by mouth as needed.    . docusate sodium (COLACE) 100 MG capsule Take 200 mg by mouth 2 (two) times daily.    . empagliflozin (JARDIANCE) 10 MG TABS tablet Take 10 mg by mouth daily.    Marland Kitchen EPINEPHrine 0.3 mg/0.3 mL IJ SOAJ injection Inject 0.3 mLs (0.3 mg total) into the muscle as needed for anaphylaxis. 2 each 0  . escitalopram (LEXAPRO) 10 MG tablet Take 10 mg by mouth daily.    . famotidine (PEPCID) 20 MG tablet Take 20 mg by  mouth daily.     . fluorometholone (FML) 0.1 % ophthalmic suspension fluorometholone 0.1 % eye drops,suspension    . furosemide (LASIX) 20 MG tablet     . glucose blood test strip Use to check blood sugar 1 time per day. 100 each 3  . insulin aspart (NOVOLOG) 100 UNIT/ML FlexPen Administer 2 units if BG between 250-300 or 3 units if BG >300 three times daily with meals. MAX TDD 9    . insulin aspart protamine - aspart (NOVOLOG 70/30 MIX) (70-30) 100 UNIT/ML FlexPen insulin aspar prot-insulin aspart 100 unit/mL (70-30) subcutaneous pen  Inject by subcutaneous route.    . insulin glargine (LANTUS) 100 UNIT/ML injection Inject 14 Units into the skin at bedtime.     . Insulin Pen Needle 32G X 4 MM MISC Used to inject insulin 3x daily 270 each 2  . levothyroxine (SYNTHROID) 75 MCG tablet Take 75 mcg by mouth daily before breakfast.    . lidocaine (LIDODERM) 5 % Place 1 patch onto the skin daily. Remove & Discard patch within 12 hours or as directed by MD (Patient taking differently: Place 1-2 patches onto the skin daily as needed (pain). Remove & Discard patch within 12 hours or as directed by MD) 90 patch 1  . loperamide (IMODIUM) 2 MG capsule Take 2 mg by mouth as needed for diarrhea or loose stools.    . phenytoin (DILANTIN) 100 MG ER capsule Take 1 capsule (100 mg) in the morning and Take 2 capsules (200 mg) at bedtime 270 capsule 3  . phenytoin (DILANTIN) 50 MG tablet Chew 50 mg by mouth daily.    . phenytoin (PHENYTOIN INFATABS) 50 MG tablet CHEW 1 TABLET DAILY 90 tablet 3  . Polyethyl Glycol-Propyl Glycol 0.4-0.3 % SOLN Place 2 drops into both eyes 2 (two) times a day.     . primidone (MYSOLINE) 50 MG tablet Take 2 tablets by mouth in the AM, 1 tablet at lunch and 1 tablet at dinner. 360 tablet 3  . Probiotic Product (ALIGN) 4 MG CAPS Take 1 capsule by mouth  3 (three) times daily after meals. Taking differently 1 cap daily    . traMADol (ULTRAM) 50 MG tablet tramadol 50 mg tablet   50 mg by oral  route.    . trandolapril-verapamil (TARKA) 2-240 MG tablet Take 1 tablet by mouth daily.    . verapamil (CALAN-SR) 240 MG CR tablet verapamil ER (SR) 240 mg tablet,extended release  TAKE 1 TABLET BY MOUTH EVERY DAY     No facility-administered medications prior to visit.    PAST MEDICAL HISTORY: Past Medical History:  Diagnosis Date  . Anemia   . Anxiety   . Breast cancer ([place]) 2014   right breast  . Cancer of right breast ([place]) 12/26/12   right breast 12:00 o'clock, DCIS  . Chronic cough   . Chronic facial pain   . Chronic foot pain   . Complication of anesthesia    Sore jaw; could not chew or move mouth  . Convulsions/seizures ([place]) 10/16/2014  . Diabetes mellitus    type 2 niddm x 20 years  . Dyslipidemia   . Ejection fraction   . GERD (gastroesophageal reflux disease)   . Hammer toe    bilateral  . History of colonic polyps   . HTN (hypertension)   . Hx of radiation therapy 03/07/13- 03/29/13   right breast 4250 cGy 17 sessions  . Hyperlipidemia   . Melanoma ([place])   . Metatarsal bone fracture 2014  . Multiple drug allergies   . Obesity   . Osteoarthritis   . Palpitations   . Personal history of radiation therapy 2014  . Seasonal allergies   . Skin cancer   . Tremor, essential 08/18/2016  . Vitamin B12 deficiency     PAST SURGICAL HISTORY: Past Surgical History:  Procedure Laterality Date  . ABDOMINAL HYSTERECTOMY    . BRAIN SURGERY    . BREAST BIOPSY Right 01/24/2013   Procedure: RE-EXCICION OF BREAST CANCER, ANTERIOR MARGINS;  Surgeon: Edward Jolly, MD;  Location: [facility];  Service: General;  Laterality: Right;  . BREAST LUMPECTOMY Right 2014  . BREAST LUMPECTOMY WITH NEEDLE LOCALIZATION Right 01/17/2013   Procedure: BREAST LUMPECTOMY WITH NEEDLE LOCALIZATION;  Surgeon: Edward Jolly, MD;  Location: [facility];  Service: General;  Laterality: Right;  . BUNIONECTOMY    . CATARACT EXTRACTION W/ INTRAOCULAR LENS IMPLANT Right   . CHOLECYSTECTOMY    .  COLONOSCOPY    . CRANIOTOMY Right 10/18/2013   Procedure: CRANIOTOMY TUMOR EXCISION;  Surgeon: Floyce Stakes, MD;  Location: [facility];  Service: Neurosurgery;  Laterality: Right;  . EYE SURGERY    . HERNIA REPAIR    . KNEE ARTHROSCOPY Bilateral   . POLYPECTOMY     small adenomatous  . ULNAR TUNNEL RELEASE      FAMILY HISTORY: Family History  Problem Relation Age of Onset  . Heart disease Mother   . Osteoporosis Mother   . Diabetes Father   . Pancreatic cancer Father   . Colon cancer Other   . Bone cancer Sister   . Prostate cancer Brother   . Colon cancer Brother   . Rectal cancer Sister   . Thyroid disease Sister        benign goiter resected    SOCIAL HISTORY: Social History   Socioeconomic History  . Marital status: Widowed    Spouse name: Not on file  . Number of children: 0  . Years of education: college  . Highest education level: Not on file  Occupational History  . Occupation: retired  Tobacco Use  . Smoking status: Never Smoker  . Smokeless tobacco: Never Used  Substance and Sexual Activity  . Alcohol use: No  . Drug use: No  . Sexual activity: Yes    Comment: menarche age 59, hysterectomy age 63, HRT x 2-3 mos, G1- miscarriage  Other Topics Concern  . Not on file  Social History Narrative   Widowed, lives alone.  Ambulates independently.    Drinks decaf only   Patient is right handed.   Social Determinants of Health   Financial Resource Strain:   . Difficulty of Paying Living Expenses:   Food Insecurity:   . Worried About Charity fundraiser in the Last Year:   . Arboriculturist in the Last Year:   Transportation Needs:   . Film/video editor (Medical):   Marland Kitchen Lack of Transportation (Non-Medical):   Physical Activity:   . Days of Exercise per Week:   . Minutes of Exercise per Session:   Stress:   . Feeling of Stress :   Social Connections:   . Frequency of Communication with Friends and Family:   . Frequency of Social Gatherings  with Friends and Family:   . Attends Religious Services:   . Active Member of Clubs or Organizations:   . Attends Archivist Meetings:   Marland Kitchen Marital Status:   Intimate Partner Violence:   . Fear of Current or Ex-Partner:   . Emotionally Abused:   Marland Kitchen Physically Abused:   . Sexually Abused:       PHYSICAL EXAM  Vitals:   08/08/19 1259  BP: (!) 156/76  Pulse: 79  Temp: (!) 97.4 F (36.3 C)  Weight: 140 lb (63.5 kg)  Height: '5\' 7"'  (1.702 m)   Body mass index is 21.93 kg/m.  Generalized: Well developed, in no acute distress   Neurological examination  Mentation: Alert oriented to time, place, history taking. Follows all commands speech and language fluent Cranial nerve II-XII: Pupils were equal round reactive to light. Extraocular movements were full, visual field were full on confrontational test. Head turning and shoulder shrug  were normal and symmetric. Motor: The motor testing reveals 5 over 5 strength of all 4 extremities. Good symmetric motor tone is noted throughout.  Sensory: Sensory testing is intact to soft touch on all 4 extremities. No evidence of extinction is noted.  Coordination: Cerebellar testing reveals good finger-nose-finger and heel-to-shin bilaterally.  Mild tremor noted in the upper extremities Gait and station: Patient's gait is slightly unsteady.  Tandem gait not attempted.   DIAGNOSTIC DATA (LABS, IMAGING, TESTING) - I reviewed patient records, labs, notes, testing and imaging myself where available.  Lab Results  Component Value Date   WBC 8.5 02/06/2019   HGB 12.2 02/06/2019   HCT 36.6 02/06/2019   MCV 96.1 02/06/2019   PLT 288 02/06/2019      Component Value Date/Time   NA 136 02/06/2019 1511   NA 138 08/31/2017 1053   NA 138 01/04/2013 1212   K 4.4 02/06/2019 1511   K 3.9 01/04/2013 1212   CL 103 02/06/2019 1511   CO2 24 02/06/2019 1511   CO2 22 01/04/2013 1212   GLUCOSE 199 (H) 02/06/2019 1511   GLUCOSE 285 (H) 01/04/2013  1212   BUN 15 02/06/2019 1511   BUN 11 08/31/2017 1053   BUN 10.3 01/04/2013 1212   CREATININE 0.83 02/06/2019 1511   CREATININE 0.78 12/01/2013 1659   CREATININE  1.0 01/04/2013 1212   CALCIUM 8.2 (L) 02/06/2019 1511   CALCIUM 9.6 01/04/2013 1212   PROT 6.0 (L) 02/06/2019 1511   PROT 5.8 (L) 08/31/2017 1053   PROT 6.4 01/04/2013 1212   ALBUMIN 3.4 (L) 02/06/2019 1511   ALBUMIN 4.1 08/31/2017 1053   ALBUMIN 3.6 01/04/2013 1212   AST 28 02/06/2019 1511   AST 14 01/04/2013 1212   ALT 30 02/06/2019 1511   ALT 16 01/04/2013 1212   ALKPHOS 78 02/06/2019 1511   ALKPHOS 52 01/04/2013 1212   BILITOT 0.1 (L) 02/06/2019 1511   BILITOT 0.2 08/31/2017 1053   BILITOT 1.01 01/04/2013 1212   GFRNONAA >60 02/06/2019 1511   GFRAA >60 02/06/2019 1511   Lab Results  Component Value Date   CHOL 148 01/30/2013   HDL 47.10 01/30/2013   LDLCALC 65 01/30/2013   LDLDIRECT 85.7 09/05/2009   TRIG 179.0 (H) 01/30/2013   CHOLHDL 3 01/30/2013   Lab Results  Component Value Date   HGBA1C 8.4 (H) 10/26/2018   Lab Results  Component Value Date   VITAMINB12 501 09/05/2009   Lab Results  Component Value Date   TSH 1.60 03/02/2016      ASSESSMENT AND PLAN 86 y.o. year old female  has a past medical history of Anemia, Anxiety, Breast cancer ([place]) (2014), Cancer of right breast ([place]) (12/26/12), Chronic cough, Chronic facial pain, Chronic foot pain, Complication of anesthesia, Convulsions/seizures ([place]) (10/16/2014), Diabetes mellitus, Dyslipidemia, Ejection fraction, GERD (gastroesophageal reflux disease), Hammer toe, History of colonic polyps, HTN (hypertension), radiation therapy (03/07/13- 03/29/13), Hyperlipidemia, Melanoma ([place]), Metatarsal bone fracture (2014), Multiple drug allergies, Obesity, Osteoarthritis, Palpitations, Personal history of radiation therapy (2014), Seasonal allergies, Skin cancer, Tremor, essential (08/18/2016), and Vitamin B12 deficiency. here with:  1.  Essential tremor 2.   Anxiety 3.  Frequent falls  -Continue current dose of primidone and Dilantin -I will check drug levels for toxicity -Per Dr. Tobey Grim note he does not feel that Xanax is contributing to her falls and felt that it was okay to increase her quantity.  I will give her 8 extra tablets a month. -Advised patient if her symptoms worsen or she develops new symptoms she should let us know.  I spent 30 minutes of face-to-face and non-face-to-face time with patient.  This included previsit chart review, lab review, study review, order entry, electronic health record documentation, patient education.  Ward Givens, MSN, NP-C 08/08/2019, 12:58 PM [facility] [address] [place], [place] [address] [phone]

## 2019-08-08 NOTE — Patient Instructions (Signed)
Your Plan:  Continue Primidone and Dilantin  Blood work today     Thank you for coming to see [place] at [facility]. I hope we have been able to provide you high quality care today.  You Schmeling receive a patient satisfaction survey over the next few weeks. We would appreciate your feedback and comments so that we Port continue to improve ourselves and the health of our patients.

## 2019-08-09 LAB — PRIMIDONE, SERUM
Phenobarbital, Serum: 14 ug/mL — ABNORMAL LOW (ref 15–40)
Primidone Lvl: 6.7 ug/mL (ref 5.0–12.0)

## 2019-08-09 LAB — PHENYTOIN LEVEL, TOTAL: Phenytoin (Dilantin), Serum: 9.4 ug/mL — ABNORMAL LOW (ref 10.0–20.0)

## 2019-08-10 NOTE — Telephone Encounter (Signed)
-----   Message from Ward Givens, NP sent at 08/10/2019  3:42 PM EDT ----- Advised that levels are normal.  Please advise patient that I plan to talk to Dr. Jannifer Franklin about adjusting her medication he will be back in office on Monday

## 2019-08-10 NOTE — Telephone Encounter (Signed)
I called pt and relayed the results of her labs and normal levels.  Dr. Jannifer Franklin to be consulted about adjusting medications.  She verbalized undnerstanding.

## 2019-08-14 ENCOUNTER — Telehealth: Payer: Self-pay | Admitting: Adult Health

## 2019-08-14 DIAGNOSIS — E1165 Type 2 diabetes mellitus with hyperglycemia: Secondary | ICD-10-CM | POA: Diagnosis not present

## 2019-08-14 DIAGNOSIS — Z794 Long term (current) use of insulin: Secondary | ICD-10-CM | POA: Diagnosis not present

## 2019-08-14 DIAGNOSIS — M25562 Pain in left knee: Secondary | ICD-10-CM | POA: Diagnosis not present

## 2019-08-14 NOTE — Telephone Encounter (Signed)
Please advise patient that I discussed her results with Dr. Jannifer Franklin.  On exam her tremor was fairly mild.  Unless the tremor is significantly interfering with her daily life Dr. Jannifer Franklin recommended that we keep her doses the same.  She can try using weighted utensils or wrist weights.  However if she is insistent that the tremor is interfering with her daily function we can increase by 50 mg.  She could take 1 tablet in the morning, 1 tablet at lunch and 3 tablets at bedtime.  If she decides to increase her medication please encourage her to be cautious with her gait.

## 2019-08-15 NOTE — Telephone Encounter (Signed)
noted 

## 2019-08-15 NOTE — Telephone Encounter (Signed)
I called pt and relayed the information per MM/NP and Dr Jannifer Franklin.  She will proceed as taking same doses at this time.  If things worsen then she will callus back.  She did mention falling again, (was not using her walker).  I relayed to used her walker at all times.  She verbalized understanding.

## 2019-08-28 DIAGNOSIS — M25561 Pain in right knee: Secondary | ICD-10-CM | POA: Diagnosis not present

## 2019-08-28 DIAGNOSIS — M1711 Unilateral primary osteoarthritis, right knee: Secondary | ICD-10-CM | POA: Diagnosis not present

## 2019-08-28 DIAGNOSIS — M1712 Unilateral primary osteoarthritis, left knee: Secondary | ICD-10-CM | POA: Diagnosis not present

## 2019-08-28 DIAGNOSIS — M170 Bilateral primary osteoarthritis of knee: Secondary | ICD-10-CM | POA: Diagnosis not present

## 2019-08-29 DIAGNOSIS — L905 Scar conditions and fibrosis of skin: Secondary | ICD-10-CM | POA: Diagnosis not present

## 2019-08-29 DIAGNOSIS — Z85820 Personal history of malignant melanoma of skin: Secondary | ICD-10-CM | POA: Diagnosis not present

## 2019-08-29 DIAGNOSIS — D485 Neoplasm of uncertain behavior of skin: Secondary | ICD-10-CM | POA: Diagnosis not present

## 2019-08-29 DIAGNOSIS — C44722 Squamous cell carcinoma of skin of right lower limb, including hip: Secondary | ICD-10-CM | POA: Diagnosis not present

## 2019-08-29 DIAGNOSIS — L820 Inflamed seborrheic keratosis: Secondary | ICD-10-CM | POA: Diagnosis not present

## 2019-08-29 DIAGNOSIS — L814 Other melanin hyperpigmentation: Secondary | ICD-10-CM | POA: Diagnosis not present

## 2019-08-29 DIAGNOSIS — D229 Melanocytic nevi, unspecified: Secondary | ICD-10-CM | POA: Diagnosis not present

## 2019-09-04 DIAGNOSIS — M1712 Unilateral primary osteoarthritis, left knee: Secondary | ICD-10-CM | POA: Diagnosis not present

## 2019-09-04 DIAGNOSIS — H04123 Dry eye syndrome of bilateral lacrimal glands: Secondary | ICD-10-CM | POA: Diagnosis not present

## 2019-09-04 DIAGNOSIS — M170 Bilateral primary osteoarthritis of knee: Secondary | ICD-10-CM | POA: Diagnosis not present

## 2019-09-04 DIAGNOSIS — M1711 Unilateral primary osteoarthritis, right knee: Secondary | ICD-10-CM | POA: Diagnosis not present

## 2019-09-04 NOTE — Telephone Encounter (Signed)
I called and LMVM for pt to continue with taking as prescribed 1 am 1 lunch and 3 bedtime of the primidone.  MM/NP will discuss with Dr. Jannifer Franklin next wk, unless we hear from him this week at some point.

## 2019-09-04 NOTE — Telephone Encounter (Signed)
Please let the patient know that she is very increased to 50 mg which is why Dr. Jannifer Franklin has suggested.  I am not sure that I would continue increasing this medication however I will discuss with Dr. Jannifer Franklin when he returns to the office.

## 2019-09-04 NOTE — Telephone Encounter (Signed)
Patient states she has major tremors in her hands and medication is not working for her and would like to know what else she could take or what options she has available.

## 2019-09-04 NOTE — Telephone Encounter (Signed)
Spoke to pt and she is taking primidone 50mg  po am, launc, 3 at bedtime (now when is initally spoke to her she was taking 1 tabs TID and 3 at bedtime, then she switched it on me.  She is still having tremors, and is making her a nervous wreck.  She is using utensil, but still shakes food off.

## 2019-09-05 ENCOUNTER — Other Ambulatory Visit: Payer: Self-pay

## 2019-09-05 ENCOUNTER — Ambulatory Visit (HOSPITAL_COMMUNITY)
Admission: RE | Admit: 2019-09-05 | Discharge: 2019-09-05 | Disposition: A | Discharge status: Home / Self Care | Payer: Medicare Other | Source: Ambulatory Visit | Attending: Nurse Practitioner | Admitting: Nurse Practitioner

## 2019-09-05 DIAGNOSIS — I10 Essential (primary) hypertension: Secondary | ICD-10-CM | POA: Diagnosis not present

## 2019-09-05 DIAGNOSIS — M810 Age-related osteoporosis without current pathological fracture: Secondary | ICD-10-CM | POA: Insufficient documentation

## 2019-09-05 DIAGNOSIS — D649 Anemia, unspecified: Secondary | ICD-10-CM | POA: Diagnosis not present

## 2019-09-05 DIAGNOSIS — I5189 Other ill-defined heart diseases: Secondary | ICD-10-CM | POA: Insufficient documentation

## 2019-09-05 DIAGNOSIS — R5383 Other fatigue: Secondary | ICD-10-CM | POA: Diagnosis not present

## 2019-09-05 DIAGNOSIS — Z7689 Persons encountering health services in other specified circumstances: Secondary | ICD-10-CM | POA: Diagnosis not present

## 2019-09-05 MED ORDER — ZOLEDRONIC ACID 5 MG/100ML IV SOLN: 5.0000 mg | Freq: Once | INTRAVENOUS | Status: AC

## 2019-09-05 MED ORDER — ZOLEDRONIC ACID 5 MG/100ML IV SOLN
INTRAVENOUS | Status: AC
  Administered 2019-09-05: 5 mg via INTRAVENOUS
  Filled 2019-09-05: qty 100

## 2019-09-11 ENCOUNTER — Other Ambulatory Visit: Payer: Self-pay

## 2019-09-11 ENCOUNTER — Encounter: Payer: Self-pay | Admitting: Podiatry

## 2019-09-11 ENCOUNTER — Ambulatory Visit (INDEPENDENT_AMBULATORY_CARE_PROVIDER_SITE_OTHER): Payer: Medicare Other | Admitting: Podiatry

## 2019-09-11 VITALS — Temp 96.8°F

## 2019-09-11 DIAGNOSIS — M1711 Unilateral primary osteoarthritis, right knee: Secondary | ICD-10-CM | POA: Diagnosis not present

## 2019-09-11 DIAGNOSIS — M170 Bilateral primary osteoarthritis of knee: Secondary | ICD-10-CM | POA: Diagnosis not present

## 2019-09-11 DIAGNOSIS — M79674 Pain in right toe(s): Secondary | ICD-10-CM | POA: Diagnosis not present

## 2019-09-11 DIAGNOSIS — E1142 Type 2 diabetes mellitus with diabetic polyneuropathy: Secondary | ICD-10-CM | POA: Diagnosis not present

## 2019-09-11 DIAGNOSIS — B351 Tinea unguium: Secondary | ICD-10-CM

## 2019-09-11 DIAGNOSIS — L84 Corns and callosities: Secondary | ICD-10-CM

## 2019-09-11 DIAGNOSIS — M1712 Unilateral primary osteoarthritis, left knee: Secondary | ICD-10-CM | POA: Diagnosis not present

## 2019-09-11 DIAGNOSIS — M79675 Pain in left toe(s): Secondary | ICD-10-CM

## 2019-09-11 NOTE — Patient Instructions (Signed)
Diabetes Mellitus and Foot Care Foot care is an important part of your health, especially when you have diabetes. Diabetes Dieudonne cause you to have problems because of poor blood flow (circulation) to your feet and legs, which can cause your skin to:  Become thinner and drier.  Break more easily.  Heal more slowly.  Peel and crack. You Pellicane also have nerve damage (neuropathy) in your legs and feet, causing decreased feeling in them. This means that you Castorena not notice minor injuries to your feet that could lead to more serious problems. Noticing and addressing any potential problems early is the best way to prevent future foot problems. How to care for your feet Foot hygiene  Wash your feet daily with warm water and mild soap. Do not use hot water. Then, pat your feet and the areas between your toes until they are completely dry. Do not soak your feet as this can dry your skin.  Trim your toenails straight across. Do not dig under them or around the cuticle. File the edges of your nails with an emery board or nail file.  Apply a moisturizing lotion or petroleum jelly to the skin on your feet and to dry, brittle toenails. Use lotion that does not contain alcohol and is unscented. Do not apply lotion between your toes. Shoes and socks  Wear clean socks or stockings every day. Make sure they are not too tight. Do not wear knee-high stockings since they Mirsky decrease blood flow to your legs.  Wear shoes that fit properly and have enough cushioning. Always look in your shoes before you put them on to be sure there are no objects inside.  To break in new shoes, wear them for just a few hours a day. This prevents injuries on your feet. Wounds, scrapes, corns, and calluses  Check your feet daily for blisters, cuts, bruises, sores, and redness. If you cannot see the bottom of your feet, use a mirror or ask someone for help.  Do not cut corns or calluses or try to remove them with medicine.  If you  find a minor scrape, cut, or break in the skin on your feet, keep it and the skin around it clean and dry. You Effertz clean these areas with mild soap and water. Do not clean the area with peroxide, alcohol, or iodine.  If you have a wound, scrape, corn, or callus on your foot, look at it several times a day to make sure it is healing and not infected. Check for: ? Redness, swelling, or pain. ? Fluid or blood. ? Warmth. ? Pus or a bad smell. General instructions  Do not cross your legs. This Lanphere decrease blood flow to your feet.  Do not use heating pads or hot water bottles on your feet. They Gowin burn your skin. If you have lost feeling in your feet or legs, you Inglis not know this is happening until it is too late.  Protect your feet from hot and cold by wearing shoes, such as at the beach or on hot pavement.  Schedule a complete foot exam at least once a year (annually) or more often if you have foot problems. If you have foot problems, report any cuts, sores, or bruises to your health care provider immediately. Contact a health care provider if:  You have a medical condition that increases your risk of infection and you have any cuts, sores, or bruises on your feet.  You have an injury that is not   healing.  You have redness on your legs or feet.  You feel burning or tingling in your legs or feet.  You have pain or cramps in your legs and feet.  Your legs or feet are numb.  Your feet always feel cold.  You have pain around a toenail. Get help right away if:  You have a wound, scrape, corn, or callus on your foot and: ? You have pain, swelling, or redness that gets worse. ? You have fluid or blood coming from the wound, scrape, corn, or callus. ? Your wound, scrape, corn, or callus feels warm to the touch. ? You have pus or a bad smell coming from the wound, scrape, corn, or callus. ? You have a fever. ? You have a red line going up your leg. Summary  Check your feet every day  for cuts, sores, red spots, swelling, and blisters.  Moisturize feet and legs daily.  Wear shoes that fit properly and have enough cushioning.  If you have foot problems, report any cuts, sores, or bruises to your health care provider immediately.  Schedule a complete foot exam at least once a year (annually) or more often if you have foot problems. This information is not intended to replace advice given to you by your health care provider. Make sure you discuss any questions you have with your health care provider. Document Revised: 01/11/2019 Document Reviewed: 05/22/2016 Elsevier Patient Education  2020 Elsevier Inc.  

## 2019-09-12 DIAGNOSIS — S32001A Stable burst fracture of unspecified lumbar vertebra, initial encounter for closed fracture: Secondary | ICD-10-CM | POA: Diagnosis not present

## 2019-09-12 DIAGNOSIS — Z8781 Personal history of (healed) traumatic fracture: Secondary | ICD-10-CM | POA: Diagnosis not present

## 2019-09-12 DIAGNOSIS — M546 Pain in thoracic spine: Secondary | ICD-10-CM | POA: Diagnosis not present

## 2019-09-17 NOTE — Progress Notes (Signed)
Subjective: [name] presents today at risk foot care with history of diabetic neuropathy and at risk foot care. Patient has history of ulceration of right 4th digit. She relates no new pedal concerns on today's visit.  She states she hasn't been on her feet as much lately.  Bartholome Bill, MD is patient's PCP. Last visit was: 09/05/2019.  Past Medical History:  Diagnosis Date  . Anemia   . Anxiety   . Breast cancer ([place]) 2014   right breast  . Cancer of right breast ([place]) 12/26/12   right breast 12:00 o'clock, DCIS  . Chronic cough   . Chronic facial pain   . Chronic foot pain   . Complication of anesthesia    Sore jaw; could not chew or move mouth  . Convulsions/seizures ([place]) 10/16/2014  . Diabetes mellitus    type 2 niddm x 20 years  . Dyslipidemia   . Ejection fraction   . GERD (gastroesophageal reflux disease)   . Hammer toe    bilateral  . History of colonic polyps   . HTN (hypertension)   . Hx of radiation therapy 03/07/13- 03/29/13   right breast 4250 cGy 17 sessions  . Hyperlipidemia   . Melanoma ([place])   . Metatarsal bone fracture 2014  . Multiple drug allergies   . Obesity   . Osteoarthritis   . Palpitations   . Personal history of radiation therapy 2014  . Seasonal allergies   . Skin cancer   . Tremor, essential 08/18/2016  . Vitamin B12 deficiency      Current Outpatient Medications on File Prior to Visit  Medication Sig Dispense Refill  . ALPRAZolam (XANAX) 0.25 MG tablet Take 1 tablet at bedtime as needed. Can take 1 tablet during the day if needed for anxiety 114 tablet 0  . apixaban (ELIQUIS) 2.5 MG TABS tablet Take by mouth.    . Biotin 1000 MCG tablet Take 1,000 mcg by mouth daily.     . Blood Glucose Monitoring Suppl (PRODIGY VOICE BLOOD GLUCOSE) w/Device KIT Use to check blood sugar 1 time per day. 1 each 2  . calcitonin, salmon, (MIACALCIN/FORTICAL) 200 UNIT/ACT nasal spray     . Cholecalciferol 5000 units capsule Take 10,000 Units by  mouth 2 (two) times a day.     . cholestyramine (QUESTRAN) 4 g packet     . colesevelam (WELCHOL) 625 MG tablet colesevelam 625 mg tablet    . cyanocobalamin (,VITAMIN B-12,) 1000 MCG/ML injection INJECT 1 ML INTRAMUSCULARLY EVERY 21 DAYS (Patient taking differently: Inject 1,000 mcg into the muscle every 21 ( twenty-one) days. ) 3 mL 2  . diclofenac (VOLTAREN) 75 MG EC tablet diclofenac sodium 75 mg tablet,delayed release  TAKE 1 TABLET BY MOUTH TWICE DAILY    . diclofenac Sodium (VOLTAREN) 1 % GEL     . diltiazem (TIAZAC) 180 MG 24 hr capsule Take by mouth.    . DiphenhydrAMINE HCl (BENADRYL ALLERGY PO) Take 1 tablet by mouth as needed.    . docusate sodium (COLACE) 100 MG capsule Take 200 mg by mouth 2 (two) times daily.    . empagliflozin (JARDIANCE) 10 MG TABS tablet Take 10 mg by mouth daily.    Marland Kitchen EPINEPHrine 0.3 mg/0.3 mL IJ SOAJ injection Inject 0.3 mLs (0.3 mg total) into the muscle as needed for anaphylaxis. 2 each 0  . escitalopram (LEXAPRO) 10 MG tablet Take 10 mg by mouth daily.    . famotidine (PEPCID) 20 MG tablet Take  20 mg by mouth daily.     . fluorometholone (FML) 0.1 % ophthalmic suspension fluorometholone 0.1 % eye drops,suspension    . furosemide (LASIX) 20 MG tablet     . glucose blood test strip Use to check blood sugar 1 time per day. 100 each 3  . insulin aspart (NOVOLOG) 100 UNIT/ML FlexPen Administer 2 units if BG between 250-300 or 3 units if BG >300 three times daily with meals. MAX TDD 9    . insulin aspart protamine - aspart (NOVOLOG 70/30 MIX) (70-30) 100 UNIT/ML FlexPen insulin aspar prot-insulin aspart 100 unit/mL (70-30) subcutaneous pen  Inject by subcutaneous route.    . insulin glargine (LANTUS) 100 UNIT/ML injection Inject 14 Units into the skin at bedtime.     . Insulin Pen Needle 32G X 4 MM MISC Used to inject insulin 3x daily 270 each 2  . levothyroxine (SYNTHROID) 75 MCG tablet Take 75 mcg by mouth daily before breakfast.    . lidocaine (LIDODERM) 5  % Place 1 patch onto the skin daily. Remove & Discard patch within 12 hours or as directed by MD (Patient taking differently: Place 1-2 patches onto the skin daily as needed (pain). Remove & Discard patch within 12 hours or as directed by MD) 90 patch 1  . loperamide (IMODIUM) 2 MG capsule Take 2 mg by mouth as needed for diarrhea or loose stools.    . phenytoin (DILANTIN) 100 MG ER capsule Take 1 capsule (100 mg) in the morning and Take 2 capsules (200 mg) at bedtime 270 capsule 3  . phenytoin (DILANTIN) 50 MG tablet Chew 50 mg by mouth daily.    . phenytoin (PHENYTOIN INFATABS) 50 MG tablet CHEW 1 TABLET DAILY 90 tablet 3  . Polyethyl Glycol-Propyl Glycol 0.4-0.3 % SOLN Place 2 drops into both eyes 2 (two) times a day.     . primidone (MYSOLINE) 50 MG tablet Take 2 tablets by mouth in the AM, 1 tablet at lunch and 1 tablet at dinner. (Patient taking differently: 50 mg. Take 1 tablets by mouth in the AM, 3 tablet at dinner.) 360 tablet 3  . Probiotic Product (ALIGN) 4 MG CAPS Take 1 capsule by mouth 3 (three) times daily after meals. Taking differently 1 cap daily    . traMADol (ULTRAM) 50 MG tablet tramadol 50 mg tablet   50 mg by oral route.    . trandolapril-verapamil (TARKA) 2-240 MG tablet Take 1 tablet by mouth daily.    . verapamil (CALAN-SR) 240 MG CR tablet verapamil ER (SR) 240 mg tablet,extended release  TAKE 1 TABLET BY MOUTH EVERY DAY    . silver sulfADIAZINE (SILVADENE) 1 % cream APPLY A SMALL AMOUNT TO THE AFFECTED AREA TWICE DAILY AS NEEDED     No current facility-administered medications on file prior to visit.     Allergies  Allergen Reactions  . Bystolic [Nebivolol Hcl] Other (See Comments)    "extreme weakness, heaviness in legs & arms, swelling in legs/arms/face, swollen abdomen, pain in bladder, feet pain, soreness in chest"  . Cholestyramine     "itching rash on stomach, bloated, nausea, vomiting, sleeplessness, extreme pain in arms"  . Hydrazine Yellow [Tartrazine]  Other (See Comments)    "does not reduce high blood pressure, pain in arm, high pressure, felt like I was on verge of heart attack, really weak"  . Morphine Other (See Comments)    Feels morbid, weak, still in pain  . Niacin Palpitations    Fast heart  beat  . Niaspan [Niacin Er] Palpitations and Other (See Comments)    "fast heart beat, high blood pressure"  . Norvasc [Amlodipine Besylate] Other (See Comments)    "extreme fluid retention/pain)  . Optivar [Azelastine Hcl] Photosensitivity  . Repaglinide Hives  . Sular [Nisoldipine Er] Other (See Comments)    "severe headaches, swelling eyes, hands, feet, shortness of breath, weak, flushed face, brain boiling, fluid retention, high blood sugar, nervous, heart fast beating"  . Tegretol [Carbamazepine] Other (See Comments)    Blood poisoning   . Telmisartan Other (See Comments)    "headache, difficulty urinating, high blood sugar, fluid retention"  . Amoxicillin-Pot Clavulanate Rash  . Cefdinir Swelling    Vaginal irritation, breathing,   . Ciprofloxacin Hcl Hives  . Clonidine Other (See Comments)    Dry mouth, fluid retention  . Clonidine Hydrochloride Other (See Comments)    Dry mouth, fluid retention  . Codeine Nausea And Vomiting  . Ezetimibe Other (See Comments)    Made weak  . Hydroxychloroquine Other (See Comments)    Low platlets  . Naproxen Other (See Comments)    Shrinks bladder  . Sulfa Antibiotics Rash  . Ziac [Bisoprolol-Hydrochlorothiazide] Other (See Comments)    "stopped urination"  . Azelastine Other (See Comments)  . Elavil [Amitriptyline] Other (See Comments)    Gave Pt nightmares  . Empagliflozin Other (See Comments)    "Caused yeast infection, slowed my urine"  . Hydralazine   . Iodine   . Kenalog [Triamcinolone] Diarrhea  . Keppra [Levetiracetam] Other (See Comments)    Shaking  . Lamictal [Lamotrigine]     itching  . Lyrica [Pregabalin] Swelling  . Pseudoephedrine   . Topamax [Topiramate]     Dry  eyes  . Ace Inhibitors Other (See Comments)    unknown  . Actonel [Risedronate Sodium] Other (See Comments)    unknown  . Amlodipine Besylate Other (See Comments)    unknown  . Aspirin Other (See Comments)    unknown  . Atacand [Candesartan] Other (See Comments)    unknown  . Bextra [Valdecoxib] Other (See Comments)    unknown  . Bisoprolol-Hydrochlorothiazide Other (See Comments)    unknown  . Candesartan Cilexetil Other (See Comments)    unknown  . Cefadroxil Other (See Comments)    unknown  . Celecoxib Rash  . Hydrocodone Other (See Comments)    unknown  . Hydrocodone-Acetaminophen Other (See Comments)    unknown  . Iodinated Diagnostic Agents Rash    "All over" "All over"  . Meloxicam Other (See Comments)    unknown  . Methylprednisolone Sodium Succinate Other (See Comments)    unknown  . Nabumetone Other (See Comments)    unknown  . Penicillins Other (See Comments)    unknown  . Pseudoephedrine-Guaifenesin Other (See Comments)    unknown  . Risedronate Sodium Other (See Comments)    unknown  . Rofecoxib Other (See Comments)    unknown  . Ru-Tuss [Chlorphen-Pse-Atrop-Hyos-Scop] Other (See Comments)    unknown  . Sulfonamide Derivatives Other (See Comments)    unknown  . Sulphur [Sulfur] Other (See Comments)    unknown  . Telithromycin Other (See Comments)    unknown  . Terfenadine Other (See Comments)    unknown  . Trandolapril-Verapamil Hcl Er Other (See Comments)    Headache, difficulty urinating, high blood sugar, fluid retention  Pt is taking Tarka (trandolapril-verapamil) currently, but requests the medication stay in her allergy list  . Trandolapril-Verapamil Hcl Er Other (  See Comments)    Headache, difficulty urinating, high blood sugar, fluid retention  Pt is taking Tarka (trandolapril-verapamil) currently, but requests the medication stay in her allergy list  . Valium [Diazepam] Other (See Comments)    Makes her mean and hyper     Objective: Vauda Salvucci Jollie is a pleasant 86 y.o. y.o. Patient Race: White or Caucasian [1]  female in NAD. AAO x 3.  Vitals:   09/11/19 1112  Temp: (!) 96.8 F (36 C)   Vascular Examination: Neurovascular status unchanged b/l. Capillary fill time to digits <3 seconds b/l. Faintly palpable pedal pulses b/l. Pedal hair absent b/l Skin temperature gradient within normal limits b/l. No edema noted b/l.  Dermatological Examination: Pedal skin with normal turgor, texture and tone bilaterally. No open wounds bilaterally. No interdigital macerations bilaterally. Toenails 1-5 b/l elongated, dystrophic, thickened, crumbly with subungual debris and tenderness to dorsal palpation. Hyperkeratotic lesion(s) R 4th toe, submet head 1 left foot and submet head 1 right foot.  No erythema, no edema, no drainage, no flocculence.  Musculoskeletal: Normal muscle strength 5/5 to all lower extremity muscle groups bilaterally. No gross bony deformities bilaterally. No pain crepitus or joint limitation noted with ROM b/l. Hammertoes noted to the 2-5 bilaterally.  Neurological Examination: Protective sensation diminished with 10g monofilament b/l.  Assessment: No diagnosis found.  Plan: -Examined patient. -No new findings. No new orders. -Continue diabetic foot care principles. Literature dispensed on today.  -Toenails 1-5 b/l were debrided in length and girth with sterile nail nippers and dremel without iatrogenic bleeding.  -Corn(s) R 4th toe pared utilizing sterile scalpel blade without complication or incident. Total number debrided=1. -Callus(es) submet head 1 left foot and submet head 1 right foot pared utilizing sterile scalpel blade without complication or incident. Total number debrided =2. -Patient to continue soft, supportive shoe gear daily. -Patient to report any pedal injuries to medical professional immediately. -Patient/POA to call should there be question/concern in the interim.  Return in  about 9 weeks (around 11/13/2019).  Marzetta Board, DPM

## 2019-09-18 DIAGNOSIS — M1711 Unilateral primary osteoarthritis, right knee: Secondary | ICD-10-CM | POA: Diagnosis not present

## 2019-09-28 ENCOUNTER — Telehealth: Payer: Self-pay | Admitting: Adult Health

## 2019-09-28 DIAGNOSIS — S060X0A Concussion without loss of consciousness, initial encounter: Secondary | ICD-10-CM

## 2019-09-28 DIAGNOSIS — R269 Unspecified abnormalities of gait and mobility: Secondary | ICD-10-CM

## 2019-09-28 DIAGNOSIS — G4489 Other headache syndrome: Secondary | ICD-10-CM

## 2019-09-28 NOTE — Telephone Encounter (Signed)
I called pt and she picked up then I could not hear her.  I called back and busy signal. I called her back.  She states has had 6 falls since 3 months.  Last fall in [employer] parking lot, fell backward hit r side of head on concrete. This was [facility] 09/15/19.  She called her pcp and they said to call us.  Having headaches, Has 2 months r sided worsening vision but this has worsened she feels since fall.  Last eye exam [facility] 2 months agao.  No n/v,  She is asking for MRI,  She has hx benign tumor.  I relayed closed now will forward message to Dr. Jannifer Franklin as MM/NP gone.  This Tiegs not be addressed until tomorrow. She it to go to ED for acute change.  No soon appts for MM or CW.

## 2019-09-28 NOTE — Telephone Encounter (Signed)
Pt is asking that Dr Jannifer Franklin orders a MRI for her due to a fall she had where she ended up hitting the back of her head on concrete.  Pt states as a result of this bad fall she is having problems on the right back side of her head.  The fall took place a week ago this upcoming Friday.  Please call

## 2019-09-29 ENCOUNTER — Telehealth: Payer: Self-pay | Admitting: Neurology

## 2019-09-29 NOTE — Addendum Note (Signed)
Addended by: Kathrynn Ducking on: 09/29/2019 07:46 AM   Modules accepted: Orders

## 2019-09-29 NOTE — Telephone Encounter (Signed)
I called the patient.  Over the last 3 to 4 months, she has had increasing problems with balance and fatigue.  She was recently told by her primary care physician that she had severe iron deficiency anemia.  No other evaluation was recommended, she was just told to go on iron supplementation.  I have indicated that she needs to contact her gastroenterologist, Dr.Bucemi, and get the blood work from Dr. Luciana Axe sent to him.  She Lemay need a GI work-up.  This could have an impact on her problems with walking.  She recently had physical therapy a month ago but this was mainly focused on knee problems and leg strengthening, not on balance.  I will get physical therapy set up for her.  She did recently have levels of primidone, phenobarbital, and Dilantin which were not toxic.  She has had multiple falls, she hit her head about a week ago with ongoing headache, she did not lose consciousness.  The patient will be set up for CT scan of the brain and for physical therapy.

## 2019-09-29 NOTE — Telephone Encounter (Signed)
Medicare/tricare order sent to GI. No auth they will reach out to the patient to schedule.  

## 2019-10-09 ENCOUNTER — Ambulatory Visit: Payer: Medicare Other | Admitting: Adult Health

## 2019-10-09 ENCOUNTER — Ambulatory Visit: Payer: Medicare Other | Admitting: Neurology

## 2019-10-10 DIAGNOSIS — D649 Anemia, unspecified: Secondary | ICD-10-CM | POA: Diagnosis not present

## 2019-10-13 ENCOUNTER — Telehealth: Payer: Self-pay | Admitting: Adult Health

## 2019-10-13 MED ORDER — PRIMIDONE 50 MG PO TABS: ORAL_TABLET | ORAL | 0 refills | Status: DC

## 2019-10-13 NOTE — Addendum Note (Signed)
Addended by: Kathrynn Ducking on: 10/13/2019 12:03 PM   Modules accepted: Orders

## 2019-10-13 NOTE — Telephone Encounter (Signed)
I will send in a prescription for the primidone.

## 2019-10-13 NOTE — Telephone Encounter (Signed)
Pt has called to report that she is out of her primidone (MYSOLINE) 50 MG tablet and her tremors are very bad.  Pt would like to know if she can have some called into  [facility] [phone] while she waits the 5 days for it to come from  [place] .  Pt is asking that a script be sent to  [place] .

## 2019-10-17 ENCOUNTER — Inpatient Hospital Stay: Admission: RE | Admit: 2019-10-17 | Payer: Medicare Other | Source: Ambulatory Visit

## 2019-10-17 DIAGNOSIS — L905 Scar conditions and fibrosis of skin: Secondary | ICD-10-CM | POA: Diagnosis not present

## 2019-10-17 DIAGNOSIS — Z85828 Personal history of other malignant neoplasm of skin: Secondary | ICD-10-CM | POA: Diagnosis not present

## 2019-10-18 ENCOUNTER — Other Ambulatory Visit: Payer: Self-pay | Admitting: Gastroenterology

## 2019-10-18 DIAGNOSIS — D649 Anemia, unspecified: Secondary | ICD-10-CM | POA: Diagnosis not present

## 2019-10-18 DIAGNOSIS — K9089 Other intestinal malabsorption: Secondary | ICD-10-CM | POA: Diagnosis not present

## 2019-10-18 DIAGNOSIS — R195 Other fecal abnormalities: Secondary | ICD-10-CM | POA: Diagnosis not present

## 2019-10-30 NOTE — Progress Notes (Signed)
Spoke with patient and caregiver, Emelda Fear when reviewing preprocedure instructions for procedure on 11/07/19.

## 2019-10-31 ENCOUNTER — Ambulatory Visit
Admission: RE | Admit: 2019-10-31 | Discharge: 2019-10-31 | Disposition: A | Discharge status: Home / Self Care | Payer: Medicare Other | Source: Ambulatory Visit | Attending: Neurology | Admitting: Neurology

## 2019-10-31 ENCOUNTER — Other Ambulatory Visit: Payer: Self-pay

## 2019-10-31 DIAGNOSIS — R269 Unspecified abnormalities of gait and mobility: Secondary | ICD-10-CM

## 2019-10-31 DIAGNOSIS — G4489 Other headache syndrome: Secondary | ICD-10-CM

## 2019-10-31 DIAGNOSIS — S060X0A Concussion without loss of consciousness, initial encounter: Secondary | ICD-10-CM

## 2019-11-02 ENCOUNTER — Telehealth: Payer: Self-pay | Admitting: Neurology

## 2019-11-02 NOTE — Telephone Encounter (Signed)
I tried to call pt and give her the CT results. I call the number three times and it just rang. It would not let me leave a voice mail.

## 2019-11-02 NOTE — Telephone Encounter (Signed)
  I have called multiple times, unable to get through to the patient on either number listed.  We will try to contact patient later today.  CT brain 10/31/19:  IMPRESSION:   CT head (without) demonstrating: - Stable, right frontal encephalomalacia and overlying craniotomy.

## 2019-11-03 ENCOUNTER — Other Ambulatory Visit (HOSPITAL_COMMUNITY)
Admission: RE | Admit: 2019-11-03 | Discharge: 2019-11-03 | Disposition: A | Discharge status: Home / Self Care | Payer: Medicare Other | Source: Ambulatory Visit | Attending: Gastroenterology | Admitting: Gastroenterology

## 2019-11-03 DIAGNOSIS — Z01812 Encounter for preprocedural laboratory examination: Secondary | ICD-10-CM | POA: Diagnosis present

## 2019-11-03 DIAGNOSIS — Z20822 Contact with and (suspected) exposure to covid-19: Secondary | ICD-10-CM | POA: Diagnosis not present

## 2019-11-03 LAB — SARS CORONAVIRUS 2 (TAT 6-24 HRS): SARS Coronavirus 2: NEGATIVE

## 2019-11-03 NOTE — Progress Notes (Signed)
Pre call done, spoke with pt, denies blood thinner use, aware to remain npo after midnight, made aware to be at hospital around 1100, questions answered

## 2019-11-07 ENCOUNTER — Encounter (HOSPITAL_COMMUNITY): Payer: Self-pay | Admitting: Gastroenterology

## 2019-11-07 ENCOUNTER — Encounter (HOSPITAL_COMMUNITY)
Admission: RE | Disposition: A | Discharge status: Home / Self Care | Payer: Self-pay | Source: Home / Self Care | Attending: Gastroenterology

## 2019-11-07 ENCOUNTER — Other Ambulatory Visit: Payer: Self-pay

## 2019-11-07 ENCOUNTER — Ambulatory Visit (HOSPITAL_COMMUNITY): Payer: Medicare Other | Admitting: Anesthesiology

## 2019-11-07 ENCOUNTER — Ambulatory Visit (HOSPITAL_COMMUNITY)
Admission: RE | Admit: 2019-11-07 | Discharge: 2019-11-07 | Disposition: A | Discharge status: Home / Self Care | Payer: Medicare Other | Attending: Gastroenterology | Admitting: Gastroenterology

## 2019-11-07 DIAGNOSIS — Z8719 Personal history of other diseases of the digestive system: Secondary | ICD-10-CM | POA: Diagnosis not present

## 2019-11-07 DIAGNOSIS — Z881 Allergy status to other antibiotic agents status: Secondary | ICD-10-CM | POA: Diagnosis not present

## 2019-11-07 DIAGNOSIS — Z800 Family history of malignant neoplasm of digestive organs: Secondary | ICD-10-CM | POA: Insufficient documentation

## 2019-11-07 DIAGNOSIS — K222 Esophageal obstruction: Secondary | ICD-10-CM | POA: Insufficient documentation

## 2019-11-07 DIAGNOSIS — D500 Iron deficiency anemia secondary to blood loss (chronic): Secondary | ICD-10-CM | POA: Insufficient documentation

## 2019-11-07 DIAGNOSIS — K449 Diaphragmatic hernia without obstruction or gangrene: Secondary | ICD-10-CM | POA: Insufficient documentation

## 2019-11-07 DIAGNOSIS — E119 Type 2 diabetes mellitus without complications: Secondary | ICD-10-CM | POA: Diagnosis not present

## 2019-11-07 DIAGNOSIS — Z79899 Other long term (current) drug therapy: Secondary | ICD-10-CM | POA: Insufficient documentation

## 2019-11-07 DIAGNOSIS — Z885 Allergy status to narcotic agent status: Secondary | ICD-10-CM | POA: Insufficient documentation

## 2019-11-07 DIAGNOSIS — Z888 Allergy status to other drugs, medicaments and biological substances status: Secondary | ICD-10-CM | POA: Insufficient documentation

## 2019-11-07 DIAGNOSIS — Z79890 Hormone replacement therapy (postmenopausal): Secondary | ICD-10-CM | POA: Diagnosis not present

## 2019-11-07 DIAGNOSIS — Z833 Family history of diabetes mellitus: Secondary | ICD-10-CM | POA: Insufficient documentation

## 2019-11-07 DIAGNOSIS — Z6820 Body mass index (BMI) 20.0-20.9, adult: Secondary | ICD-10-CM | POA: Insufficient documentation

## 2019-11-07 DIAGNOSIS — C182 Malignant neoplasm of ascending colon: Secondary | ICD-10-CM | POA: Insufficient documentation

## 2019-11-07 DIAGNOSIS — Z8249 Family history of ischemic heart disease and other diseases of the circulatory system: Secondary | ICD-10-CM | POA: Insufficient documentation

## 2019-11-07 DIAGNOSIS — Z880 Allergy status to penicillin: Secondary | ICD-10-CM | POA: Diagnosis not present

## 2019-11-07 DIAGNOSIS — M1990 Unspecified osteoarthritis, unspecified site: Secondary | ICD-10-CM | POA: Diagnosis not present

## 2019-11-07 DIAGNOSIS — E039 Hypothyroidism, unspecified: Secondary | ICD-10-CM | POA: Diagnosis not present

## 2019-11-07 DIAGNOSIS — D374 Neoplasm of uncertain behavior of colon: Secondary | ICD-10-CM | POA: Diagnosis not present

## 2019-11-07 DIAGNOSIS — I10 Essential (primary) hypertension: Secondary | ICD-10-CM | POA: Insufficient documentation

## 2019-11-07 DIAGNOSIS — Z882 Allergy status to sulfonamides status: Secondary | ICD-10-CM | POA: Diagnosis not present

## 2019-11-07 DIAGNOSIS — Z85820 Personal history of malignant melanoma of skin: Secondary | ICD-10-CM | POA: Diagnosis not present

## 2019-11-07 DIAGNOSIS — E669 Obesity, unspecified: Secondary | ICD-10-CM | POA: Diagnosis not present

## 2019-11-07 DIAGNOSIS — F419 Anxiety disorder, unspecified: Secondary | ICD-10-CM | POA: Insufficient documentation

## 2019-11-07 DIAGNOSIS — Z794 Long term (current) use of insulin: Secondary | ICD-10-CM | POA: Insufficient documentation

## 2019-11-07 DIAGNOSIS — R195 Other fecal abnormalities: Secondary | ICD-10-CM | POA: Insufficient documentation

## 2019-11-07 DIAGNOSIS — D120 Benign neoplasm of cecum: Secondary | ICD-10-CM | POA: Insufficient documentation

## 2019-11-07 DIAGNOSIS — Z923 Personal history of irradiation: Secondary | ICD-10-CM | POA: Insufficient documentation

## 2019-11-07 DIAGNOSIS — Z8349 Family history of other endocrine, nutritional and metabolic diseases: Secondary | ICD-10-CM | POA: Insufficient documentation

## 2019-11-07 DIAGNOSIS — Z886 Allergy status to analgesic agent status: Secondary | ICD-10-CM | POA: Insufficient documentation

## 2019-11-07 HISTORY — PX: BIOPSY: SHX5522

## 2019-11-07 HISTORY — PX: COLONOSCOPY WITH PROPOFOL: SHX5780

## 2019-11-07 HISTORY — PX: ESOPHAGOGASTRODUODENOSCOPY (EGD) WITH PROPOFOL: SHX5813

## 2019-11-07 LAB — GLUCOSE, CAPILLARY
Glucose-Capillary: 129 mg/dL — ABNORMAL HIGH (ref 70–99)
Glucose-Capillary: 104 mg/dL — ABNORMAL HIGH (ref 70–99)

## 2019-11-07 SURGERY — ESOPHAGOGASTRODUODENOSCOPY (EGD) WITH PROPOFOL
Anesthesia: Monitor Anesthesia Care

## 2019-11-07 MED ORDER — PROPOFOL 500 MG/50ML IV EMUL
INTRAVENOUS | Status: AC
  Filled 2019-11-07: qty 50

## 2019-11-07 MED ORDER — PROPOFOL 500 MG/50ML IV EMUL
INTRAVENOUS | Status: DC | PRN
  Administered 2019-11-07: 50 ug/kg/min via INTRAVENOUS

## 2019-11-07 MED ORDER — PROPOFOL 10 MG/ML IV BOLUS
INTRAVENOUS | Status: DC | PRN
  Administered 2019-11-07: 20 mg via INTRAVENOUS
  Administered 2019-11-07: 40 mg via INTRAVENOUS

## 2019-11-07 MED ORDER — LACTATED RINGERS IV SOLN: INTRAVENOUS | Status: DC

## 2019-11-07 SURGICAL SUPPLY — 25 items

## 2019-11-07 NOTE — H&P (Signed)
[name] is an 86 y.o. female.   Chief Complaint: Heme positive stool and anemia HPI: This is a rather well-preserved 86 year old female with a problem of anemia (hemoglobin 9.4 back in [place], came up to 10.5 following iron supplementation), as compared to a baseline hemoglobin of 12.2 last October.  No localizing GI tract symptoms.  There is a family history of colon cancer in 2 siblings with 6 colonoscopies by me showing some diminutive adenomatous, most recently 5 years ago.  Recent home Hemoccults came back 3 out of 3 strongly positive, not on any ulcerogenic medications  Past Medical History:  Diagnosis Date  . Anemia   . Anxiety   . Breast cancer ([place]) 2014   right breast  . Cancer of right breast ([place]) 12/26/12   right breast 12:00 o'clock, DCIS  . Chronic cough   . Chronic facial pain   . Chronic foot pain   . Complication of anesthesia    Sore jaw; could not chew or move mouth  . Convulsions/seizures ([place]) 10/16/2014  . Diabetes mellitus    type 2 niddm x 20 years  . Dyslipidemia   . Ejection fraction   . GERD (gastroesophageal reflux disease)   . Hammer toe    bilateral  . History of colonic polyps   . HTN (hypertension)   . Hx of radiation therapy 03/07/13- 03/29/13   right breast 4250 cGy 17 sessions  . Hyperlipidemia   . Melanoma ([place])   . Metatarsal bone fracture 2014  . Multiple drug allergies   . Obesity   . Osteoarthritis   . Palpitations   . Personal history of radiation therapy 2014  . Seasonal allergies   . Skin cancer   . Tremor, essential 08/18/2016  . Vitamin B12 deficiency     Past Surgical History:  Procedure Laterality Date  . ABDOMINAL HYSTERECTOMY    . BRAIN SURGERY    . BREAST BIOPSY Right 01/24/2013   Procedure: RE-EXCICION OF BREAST CANCER, ANTERIOR MARGINS;  Surgeon: Edward Jolly, MD;  Location: [facility];  Service: General;  Laterality: Right;  . BREAST LUMPECTOMY Right 2014  . BREAST LUMPECTOMY WITH NEEDLE LOCALIZATION Right 01/17/2013    Procedure: BREAST LUMPECTOMY WITH NEEDLE LOCALIZATION;  Surgeon: Edward Jolly, MD;  Location: [place];  Service: General;  Laterality: Right;  . BUNIONECTOMY    . CATARACT EXTRACTION W/ INTRAOCULAR LENS IMPLANT Right   . CHOLECYSTECTOMY    . COLONOSCOPY    . CRANIOTOMY Right 10/18/2013   Procedure: CRANIOTOMY TUMOR EXCISION;  Surgeon: Floyce Stakes, MD;  Location: [facility];  Service: Neurosurgery;  Laterality: Right;  . EYE SURGERY    . HERNIA REPAIR    . KNEE ARTHROSCOPY Bilateral   . POLYPECTOMY     small adenomatous  . ULNAR TUNNEL RELEASE      Family History  Problem Relation Age of Onset  . Heart disease Mother   . Osteoporosis Mother   . Diabetes Father   . Pancreatic cancer Father   . Colon cancer Other   . Bone cancer Sister   . Prostate cancer Brother   . Colon cancer Brother   . Rectal cancer Sister   . Thyroid disease Sister        benign goiter resected   Social History:  reports that she has never smoked. She has never used smokeless tobacco. She reports that she does not drink alcohol and does not use drugs.  Allergies:  Allergies  Allergen  Reactions  . Bystolic [Nebivolol Hcl] Other (See Comments)    "extreme weakness, heaviness in legs & arms, swelling in legs/arms/face, swollen abdomen, pain in bladder, feet pain, soreness in chest"  . Cholestyramine     "itching rash on stomach, bloated, nausea, vomiting, sleeplessness, extreme pain in arms"  . Hydrazine Yellow [Tartrazine] Other (See Comments)    "does not reduce high blood pressure, pain in arm, high pressure, felt like I was on verge of heart attack, really weak"  . Morphine Other (See Comments)    Feels morbid, weak, still in pain  . Niacin Palpitations    Fast heart beat  . Niaspan [Niacin Er] Palpitations and Other (See Comments)    "fast heart beat, high blood pressure"  . Norvasc [Amlodipine Besylate] Other (See Comments)    "extreme fluid retention/pain)  . Optivar [Azelastine  Hcl] Photosensitivity  . Repaglinide Hives  . Sular [Nisoldipine Er] Other (See Comments)    "severe headaches, swelling eyes, hands, feet, shortness of breath, weak, flushed face, brain boiling, fluid retention, high blood sugar, nervous, heart fast beating"  . Tegretol [Carbamazepine] Other (See Comments)    Blood poisoning   . Telmisartan Other (See Comments)    "headache, difficulty urinating, high blood sugar, fluid retention"  . Amoxicillin-Pot Clavulanate Rash  . Cefdinir Swelling    Vaginal irritation, breathing,   . Ciprofloxacin Hcl Hives  . Clonidine Other (See Comments)    Dry mouth, fluid retention  . Clonidine Hydrochloride Other (See Comments)    Dry mouth, fluid retention  . Codeine Nausea And Vomiting  . Ezetimibe Other (See Comments)    Made weak  . Hydroxychloroquine Other (See Comments)    Low platlets  . Naproxen Other (See Comments)    Shrinks bladder  . Sulfa Antibiotics Rash  . Ziac [Bisoprolol-Hydrochlorothiazide] Other (See Comments)    "stopped urination"  . Azelastine Other (See Comments)  . Elavil [Amitriptyline] Other (See Comments)    Gave Pt nightmares  . Empagliflozin Other (See Comments)    "Caused yeast infection, slowed my urine"  . Hydralazine   . Iodine   . Kenalog [Triamcinolone] Diarrhea  . Keppra [Levetiracetam] Other (See Comments)    Shaking  . Lamictal [Lamotrigine]     itching  . Lyrica [Pregabalin] Swelling  . Pseudoephedrine   . Topamax [Topiramate]     Dry eyes  . Ace Inhibitors Other (See Comments)    unknown  . Actonel [Risedronate Sodium] Other (See Comments)    unknown  . Amlodipine Besylate Other (See Comments)    unknown  . Aspirin Other (See Comments)    unknown  . Atacand [Candesartan] Other (See Comments)    unknown  . Bextra [Valdecoxib] Other (See Comments)    unknown  . Bisoprolol-Hydrochlorothiazide Other (See Comments)    unknown  . Candesartan Cilexetil Other (See Comments)    unknown  .  Cefadroxil Other (See Comments)    unknown  . Celecoxib Rash  . Hydrocodone Other (See Comments)    unknown  . Hydrocodone-Acetaminophen Other (See Comments)    unknown  . Iodinated Diagnostic Agents Rash    "All over" "All over"  . Meloxicam Other (See Comments)    unknown  . Methylprednisolone Sodium Succinate Other (See Comments)    unknown  . Nabumetone Other (See Comments)    unknown  . Penicillins Other (See Comments)    unknown  . Pseudoephedrine-Guaifenesin Other (See Comments)    unknown  . Risedronate Sodium Other (See  Comments)    unknown  . Rofecoxib Other (See Comments)    unknown  . Ru-Tuss [Chlorphen-Pse-Atrop-Hyos-Scop] Other (See Comments)    unknown  . Sulfonamide Derivatives Other (See Comments)    unknown  . Sulphur [Sulfur] Other (See Comments)    unknown  . Telithromycin Other (See Comments)    unknown  . Terfenadine Other (See Comments)    unknown  . Trandolapril-Verapamil Hcl Er Other (See Comments)    Headache, difficulty urinating, high blood sugar, fluid retention  Pt is taking Tarka (trandolapril-verapamil) currently, but requests the medication stay in her allergy list  . Trandolapril-Verapamil Hcl Er Other (See Comments)    Headache, difficulty urinating, high blood sugar, fluid retention  Pt is taking Tarka (trandolapril-verapamil) currently, but requests the medication stay in her allergy list  . Valium [Diazepam] Other (See Comments)    Makes her mean and hyper    Medications Prior to Admission  Medication Sig Dispense Refill  . ALPRAZolam (XANAX) 0.25 MG tablet Take 1 tablet at bedtime as needed. Can take 1 tablet during the day if needed for anxiety (Patient taking differently: Take 0.25 mg by mouth 2 (two) times daily as needed (anxiety.). ) 114 tablet 0  . Biotin 1000 MCG tablet Take 1,000 mcg by mouth daily.     . Blood Glucose Monitoring Suppl (PRODIGY VOICE BLOOD GLUCOSE) w/Device KIT Use to check blood sugar 1 time per day. 1  each 2  . Cholecalciferol (VITAMIN D3 PO) Take 1 tablet by mouth in the morning and at bedtime.    . cholestyramine (QUESTRAN) 4 g packet Take 4 g by mouth in the morning and at bedtime.    . cyanocobalamin (,VITAMIN B-12,) 1000 MCG/ML injection INJECT 1 ML INTRAMUSCULARLY EVERY 21 DAYS (Patient taking differently: Inject 1,000 mcg into the muscle every 21 ( twenty-one) days. ) 3 mL 2  . diclofenac Sodium (VOLTAREN) 1 % GEL Apply 1 application topically 4 (four) times daily as needed (pain.).     Marland Kitchen diphenhydrAMINE (BENADRYL) 25 MG tablet Take 25-50 mg by mouth every 6 (six) hours as needed (allergies.).    Marland Kitchen docusate sodium (COLACE) 100 MG capsule Take 200 mg by mouth 2 (two) times daily as needed (constipation.).     Marland Kitchen escitalopram (LEXAPRO) 10 MG tablet Take 10 mg by mouth daily.    Marland Kitchen glucose blood test strip Use to check blood sugar 1 time per day. 100 each 3  . insulin aspart (NOVOLOG) 100 UNIT/ML FlexPen Inject 2-3 Units into the skin in the morning, at noon, and at bedtime. Sliding Scale Insulin    . insulin glargine (LANTUS) 100 UNIT/ML injection Inject 14 Units into the skin at bedtime.     . Insulin Pen Needle 32G X 4 MM MISC Used to inject insulin 3x daily 270 each 2  . levothyroxine (SYNTHROID) 75 MCG tablet Take 75 mcg by mouth daily before breakfast.    . lidocaine (LIDODERM) 5 % Place 1 patch onto the skin daily. Remove & Discard patch within 12 hours or as directed by MD (Patient taking differently: Place 1-2 patches onto the skin daily as needed (pain). Remove & Discard patch within 12 hours or as directed by MD) 90 patch 1  . phenytoin (DILANTIN) 100 MG ER capsule Take 1 capsule (100 mg) in the morning and Take 2 capsules (200 mg) at bedtime (Patient taking differently: Take 100-200 mg by mouth See admin instructions. Take 1 capsule (100 mg) in the morning & Take  2 capsules (200 mg) at bedtime) 270 capsule 3  . phenytoin (DILANTIN) 50 MG tablet Chew 50 mg by mouth daily. Total morning  dose=150 mg    . Polyethyl Glycol-Propyl Glycol 0.4-0.3 % SOLN Place 2 drops into both eyes 2 (two) times a day.     . primidone (MYSOLINE) 50 MG tablet Take 1 tablets by mouth in the AM, 3 tablet at dinner. (Patient taking differently: Take 50-150 mg by mouth See admin instructions. Take 3 tablets (150 mg) by mouth in the morning, take 1 tablet (50 mg) by mouth at noon, & take 1 tablet (50 mg) by mouth at night.) 30 tablet 0  . traMADol (ULTRAM) 50 MG tablet Take 50 mg by mouth every 6 (six) hours as needed (pain.).     Marland Kitchen trandolapril-verapamil (TARKA) 2-240 MG tablet Take 1 tablet by mouth at bedtime.     Marland Kitchen EPINEPHrine 0.3 mg/0.3 mL IJ SOAJ injection Inject 0.3 mLs (0.3 mg total) into the muscle as needed for anaphylaxis. 2 each 0  . furosemide (LASIX) 20 MG tablet Take 20 mg by mouth daily as needed (swelling/fluid retention.).       Results for orders placed or performed during the hospital encounter of 11/07/19 (from the past 48 hour(s))  Glucose, capillary     Status: Abnormal   Collection Time: 11/07/19 12:01 PM  Result Value Ref Range   Glucose-Capillary 129 (H) 70 - 99 mg/dL    Comment: Glucose reference range applies only to samples taken after fasting for at least 8 hours.   No results found.  Review of Systems  Pulse 61, temperature 98.4 F (36.9 C), temperature source Oral, resp. rate 13, height 5' 7.5" (1.715 m), weight 61.2 kg. Physical Exam pleasant, no acute distress, mild pallor.  Neurologically and cognitively grossly intact.  Chest clear, heart normal without murmur or arrhythmia, abdomen is without evident mass or tenderness  Assessment/Plan Proceed to upper endoscopy (which the patient has never previously had, although she does not have upper tract symptoms) and colonoscopy.  The patient is familiar with the risks of these procedures and is agreeable to proceed.  Cleotis Nipper, MD 11/07/2019, 12:30 PM

## 2019-11-07 NOTE — Op Note (Signed)
[facility] Patient Name: [name] Procedure Date: 11/07/2019 MRN: [id number] Attending MD: Ronald Lobo , MD Date of Birth: June 23, 1929 CSN: [id number] Age: 86 Admit Type: Outpatient Procedure:                Upper GI endoscopy Indications:              Iron deficiency anemia secondary to chronic blood                            loss, Heme positive stool Providers:                Ronald Lobo, MD, Angus Seller, Clyde Lundborg,                            RN, Cletis Athens, Technician, Anne Fu                            CRNA, CRNA Referring MD:              Medicines:                Monitored Anesthesia Care Complications:            No immediate complications. Estimated Blood Loss:     Estimated blood loss: none. Procedure:                Pre-Anesthesia Assessment:                           - Prior to the procedure, a History and Physical                            was performed, and patient medications and                            allergies were reviewed. The patient's tolerance of                            previous anesthesia was also reviewed. The risks                            and benefits of the procedure and the sedation                            options and risks were discussed with the patient.                            All questions were answered, and informed consent                            was obtained. Prior Anticoagulants: The patient has                            taken no previous anticoagulant or antiplatelet                            agents.  ASA Grade Assessment: III - A patient with                            severe systemic disease. After reviewing the risks                            and benefits, the patient was deemed in                            satisfactory condition to undergo the procedure.                           After obtaining informed consent, the endoscope was                            passed under direct  vision. Throughout the                            procedure, the patient's blood pressure, pulse, and                            oxygen saturations were monitored continuously. The                            [id number]-H190 ([id number]) Olympus gastroscope was                            introduced through the mouth, and advanced to the                            second part of duodenum. The upper GI endoscopy was                            accomplished without difficulty. The patient                            tolerated the procedure well. Scope In: Scope Out: Findings:      The examined esophagus was normal.      A 2 cm hiatal hernia was present.      A non-obstructing and mild Schatzki ring was found at the       gastroesophageal junction.      The entire examined stomach was normal.      There is no endoscopic evidence of erythema or inflammatory changes       suggestive of gastritis, inflammation, ulceration, angiodysplasia or       erosion in the entire examined stomach.      The cardia and gastric fundus were normal on retroflexion.      The examined duodenum was normal.      There is no endoscopic evidence of ulceration or angiodysplasia in the       entire examined duodenum including careful inspection of the bulb. Impression:               - No source of heme positive stool or iron  deficiency anemia endoscopically evident.                           - Normal esophagus.                           - 2 cm hiatal hernia.                           - Non-obstructing and mild Schatzki ring.                           - Normal stomach.                           - Normal examined duodenum.                           - No specimens collected. Moderate Sedation:      This patient was sedated with monitored anesthesia care, not moderate       sedation. Recommendation:           - Perform a colonoscopy today. Procedure Code(s):        --- Professional ---                            [phone], Esophagogastroduodenoscopy, flexible,                            transoral; diagnostic, including collection of                            specimen(s) by brushing or washing, when performed                            (separate procedure) Diagnosis Code(s):        --- Professional ---                           D50.0, Iron deficiency anemia secondary to blood                            loss (chronic)                           R19.5, Other fecal abnormalities                           K22.2, Esophageal obstruction CPT copyright 2019 American Medical Association. All rights reserved. The codes documented in this report are preliminary and upon coder review [place]  be revised to meet current compliance requirements. Ronald Lobo, MD 11/07/2019 1:26:02 PM This report has been signed electronically. Number of Addenda: 0

## 2019-11-07 NOTE — Transfer of Care (Signed)
Immediate Anesthesia Transfer of Care Note  Patient: [name]  Procedure(s) Performed: Procedure(s): ESOPHAGOGASTRODUODENOSCOPY (EGD) WITH PROPOFOL (N/A) COLONOSCOPY WITH PROPOFOL (N/A) BIOPSY  Patient Location: PACU  Anesthesia Type:MAC  Level of Consciousness:  sedated, patient cooperative and responds to stimulation  Airway & Oxygen Therapy:Patient Spontanous Breathing and Patient connected to face mask oxgen  Post-op Assessment:  Report given to PACU RN and Post -op Vital signs reviewed and stable  Post vital signs:  Reviewed and stable  Last Vitals:  Vitals:   11/07/19 1141  Pulse: 61  Resp: 13  Temp: 50.3 C    Complications: No apparent anesthesia complications

## 2019-11-07 NOTE — Op Note (Signed)
[facility] Patient Name: [name] Procedure Date: 11/07/2019 MRN: [id number] Attending MD: Ronald Lobo , MD Date of Birth: [birth year] CSN: [id number] Age: 86 Admit Type: Outpatient Procedure:                Colonoscopy Indications:              Last colonoscopy: September 2016, Heme positive                            stool, Iron deficiency anemia secondary to chronic                            blood loss Providers:                Ronald Lobo, MD, Angus Seller, Clyde Lundborg,                            RN, Cletis [place], Technician, Delena Bali CRNA,                            CRNA Referring MD:              Medicines:                Monitored Anesthesia Care Complications:            No immediate complications. Estimated Blood Loss:     Estimated blood loss was minimal. Procedure:                Pre-Anesthesia Assessment:                           - Prior to the procedure, a History and Physical                            was performed, and patient medications and                            allergies were reviewed. The patient's tolerance of                            previous anesthesia was also reviewed. The risks                            and benefits of the procedure and the sedation                            options and risks were discussed with the patient.                            All questions were answered, and informed consent                            was obtained. Prior Anticoagulants: The patient has                            taken no previous anticoagulant or  antiplatelet                            agents. ASA Grade Assessment: III - A patient with                            severe systemic disease. After reviewing the risks                            and benefits, the patient was deemed in                            satisfactory condition to undergo the procedure.                           - Prior to the procedure, a History and  Physical                            was performed, and patient medications and                            allergies were reviewed. The patient's tolerance of                            previous anesthesia was also reviewed. The risks                            and benefits of the procedure and the sedation                            options and risks were discussed with the patient.                            All questions were answered, and informed consent                            was obtained. Prior Anticoagulants: The patient has                            taken no previous anticoagulant or antiplatelet                            agents. ASA Grade Assessment: III - A patient with                            severe systemic disease. After reviewing the risks                            and benefits, the patient was deemed in                            satisfactory condition to undergo the procedure.  After obtaining informed consent, the colonoscope                            was passed under direct vision. Throughout the                            procedure, the patient's blood pressure, pulse, and                            oxygen saturations were monitored continuously. The                            [id number]-HQ190L ([id number]) Olympus colonoscope was                            introduced through the anus and advanced to the the                            cecum, identified by appendiceal orifice and                            ileocecal valve. The colonoscopy was somewhat                            difficult due to significant looping. Successful                            completion of the procedure was aided by using                            manual pressure and straightening and shortening                            the scope to obtain bowel loop reduction. The                            patient tolerated the procedure well. The quality                             of the bowel preparation was excellent. Scope In: 12:50:22 PM Scope Out: 1:15:11 PM Scope Withdrawal Time: 0 hours 12 minutes 29 seconds  Total Procedure Duration: 0 hours 24 minutes 49 seconds  Findings:      The perianal and digital rectal examinations were normal.      A 20 mm polyp was found in the cecum. The polyp was semi-sessile.       Biopsies were taken with a cold forceps for histology. Estimated blood       loss was minimal.      An ulcerated, friable non-obstructing mass was found in the proximal       ascending colon, about 3 haustrations above the ileo-cecal valve. It       measured approximately 6-8 cm across. It was rather mobile, soft,       fleshy, and had superficial necrosis/mucosal ulceration. There was a       fair  amount of yellow material present, raising the question of       lipomatous characteristics. The mass was partially circumferential       (involving one-third of the lumen circumference). Multiple biopsies were       taken with a cold forceps for histology.      The exam was otherwise normal throughout the examined colon.      There is no endoscopic evidence of diverticula, inflammation or       angiodysplasia in the entire colon.      The retroflexed view of the distal rectum and anal verge was normal and       showed no anal or rectal abnormalities. Impression:               - One 20 mm polyp in the cecum. Biopsied.                           - Mass, somewhat friable, rule out malignancy, in                            the proximal ascending colon. Biopsied. This is the                            presumed source of the patient's heme positivity                            and anemia.                           - The distal rectum and anal verge are normal on                            retroflexion view. Moderate Sedation:      This patient was sedated with monitored anesthesia care, not moderate       sedation. Recommendation:           - Await  pathology results.                           - Refer to a surgeon at appointment to be scheduled. Procedure Code(s):        --- Professional ---                           [phone], Colonoscopy, flexible; with biopsy, single                            or multiple Diagnosis Code(s):        --- Professional ---                           K63.5, Polyp of colon                           D49.0, Neoplasm of unspecified behavior of                            digestive system  R19.5, Other fecal abnormalities                           D50.0, Iron deficiency anemia secondary to blood                            loss (chronic) CPT copyright 2019 American Medical Association. All rights reserved. The codes documented in this report are preliminary and upon coder review [place]  be revised to meet current compliance requirements. Ronald Lobo, MD 11/07/2019 1:42:31 PM This report has been signed electronically. Number of Addenda: 0

## 2019-11-07 NOTE — Anesthesia Preprocedure Evaluation (Signed)
Anesthesia Evaluation  Patient identified by MRN, date of birth, ID band Patient awake    Reviewed: Allergy & Precautions, NPO status , Patient's Chart, lab work & pertinent test results  Airway Mallampati: II  TM Distance: >3 FB Neck ROM: Full    Dental no notable dental hx.    Pulmonary neg pulmonary ROS,    Pulmonary exam normal breath sounds clear to auscultation       Cardiovascular hypertension, Pt. on medications Normal cardiovascular exam Rhythm:Regular Rate:Normal     Neuro/Psych negative neurological ROS  negative psych ROS   GI/Hepatic Neg liver ROS, GERD  ,  Endo/Other  diabetes, Type 2, Insulin DependentHypothyroidism   Renal/GU negative Renal ROS  negative genitourinary   Musculoskeletal negative musculoskeletal ROS (+)   Abdominal   Peds negative pediatric ROS (+)  Hematology  (+) anemia ,   Anesthesia Other Findings   Reproductive/Obstetrics negative OB ROS                             Anesthesia Physical Anesthesia Plan  ASA: III  Anesthesia Plan: MAC   Post-op Pain Management:    Induction: Intravenous  PONV Risk Score and Plan: 0  Airway Management Planned: Nasal Cannula  Additional Equipment:   Intra-op Plan:   Post-operative Plan:   Informed Consent: I have reviewed the patients History and Physical, chart, labs and discussed the procedure including the risks, benefits and alternatives for the proposed anesthesia with the patient or authorized representative who has indicated his/her understanding and acceptance.     Dental advisory given  Plan Discussed with: CRNA and Surgeon  Anesthesia Plan Comments:         Anesthesia Quick Evaluation

## 2019-11-07 NOTE — Discharge Instructions (Signed)
YOU HAD AN ENDOSCOPIC PROCEDURE TODAY: Refer to the procedure report and other information in the discharge instructions given to you for any specific questions about what was found during the examination. If this information does not answer your questions, please call [facility] office at [phone] to clarify.  ° °YOU SHOULD EXPECT: Some feelings of bloating in the abdomen. Passage of more gas than usual. Walking can help get rid of the air that was put into your GI tract during the procedure and reduce the bloating. If you had a lower endoscopy (such as a colonoscopy or flexible sigmoidoscopy) you Krammes notice spotting of blood in your stool or on the toilet paper. Some abdominal soreness Bergh be present for a day or two, also. ° °DIET: Your first meal following the procedure should be a light meal and then it is ok to progress to your normal diet. A half-sandwich or bowl of soup is an example of a good first meal. Heavy or fried foods are harder to digest and [place] make you feel nauseous or bloated. Drink plenty of fluids but you should avoid alcoholic beverages for 24 hours. If you had a esophageal dilation, please see attached instructions for diet.  ° °ACTIVITY: Your care partner should take you home directly after the procedure. You should plan to take it easy, moving slowly for the rest of the day. You can resume normal activity the day after the procedure however YOU SHOULD NOT DRIVE, use power tools, machinery or perform tasks that involve climbing or major physical exertion for 24 hours (because of the sedation medicines used during the test).  ° °SYMPTOMS TO REPORT IMMEDIATELY: °A gastroenterologist can be reached at any hour. Please call [phone]  for any of the following symptoms:  °• Following lower endoscopy (colonoscopy, flexible sigmoidoscopy) °Excessive amounts of blood in the stool  °Significant tenderness, worsening of abdominal pains  °Swelling of the abdomen that is new, acute  °Fever of 100°  or higher  °• Following upper endoscopy (EGD, EUS, ERCP, esophageal dilation) °Vomiting of blood or coffee ground material  °New, significant abdominal pain  °New, significant chest pain or pain under the shoulder blades  °Painful or persistently difficult swallowing  °New shortness of breath  °Black, tarry-looking or red, bloody stools ° °FOLLOW UP:  °If any biopsies were taken you will be contacted by phone or by letter within the next 1-3 weeks. Call [phone]  if you have not heard about the biopsies in 3 weeks.  °Please also call with any specific questions about appointments or follow up tests. °

## 2019-11-09 NOTE — Anesthesia Postprocedure Evaluation (Signed)
Anesthesia Post Note  Patient: [name]  Procedure([name]) Performed: ESOPHAGOGASTRODUODENOSCOPY (EGD) WITH PROPOFOL (N/A ) COLONOSCOPY WITH PROPOFOL (N/A ) BIOPSY     Patient location during evaluation: PACU Anesthesia Type: MAC Level of consciousness: awake and alert Pain management: pain level controlled Vital Signs Assessment: post-procedure vital signs reviewed and stable Respiratory status: spontaneous breathing, nonlabored ventilation, respiratory function stable and patient connected to nasal cannula oxygen Cardiovascular status: stable and blood pressure returned to baseline Postop Assessment: no apparent nausea or vomiting Anesthetic complications: no   No complications documented.  Last Vitals:  Vitals:   11/07/19 1341 11/07/19 1350  BP:  (!) 162/65  Pulse:  (!) 53  Resp:  14  Temp:    SpO2: 93% 98%    Last Pain:  Vitals:   11/07/19 1350  TempSrc:   PainSc: 0-No pain                 [name]

## 2019-11-10 ENCOUNTER — Encounter (HOSPITAL_COMMUNITY): Payer: Self-pay | Admitting: Gastroenterology

## 2019-11-10 LAB — SURGICAL PATHOLOGY

## 2019-11-13 ENCOUNTER — Other Ambulatory Visit: Payer: Self-pay | Admitting: Adult Health

## 2019-11-13 NOTE — Telephone Encounter (Signed)
Pt called back, please call back when available. (Noted by Shanda Howells.)

## 2019-11-13 NOTE — Telephone Encounter (Signed)
,   Elon Alas, MD  You 11 days ago  CW I tried to contact the patient several times earlier, unable to get through, please try to call please contact the patient later this afternoon and let her know that the CT scan of the brain is unchanged from a year ago. It shows an old injury to the right frontal area. Nothing new that would impact her balance and cause falls.

## 2019-11-13 NOTE — Telephone Encounter (Signed)
I return pts phone call and I stated per Dr. Jannifer Franklin the Ct scan pf the brain is unchanged from a year ago. It shows an old injury to the right frontal area nothing new that would impact her balance and cause falls. PT verbalized understanding.

## 2019-11-14 NOTE — Telephone Encounter (Signed)
Pt is up to date on her appts. Pt is due for a refill on xanax. [place] Controlled Substance Registry checked and is appropriate.

## 2019-11-16 ENCOUNTER — Inpatient Hospital Stay: Admission: RE | Admit: 2019-11-16 | Payer: Medicare Other | Source: Ambulatory Visit

## 2019-11-16 ENCOUNTER — Other Ambulatory Visit: Payer: Medicare Other

## 2019-11-16 ENCOUNTER — Other Ambulatory Visit: Payer: Self-pay | Admitting: Surgery

## 2019-11-16 DIAGNOSIS — C189 Malignant neoplasm of colon, unspecified: Secondary | ICD-10-CM

## 2019-11-20 ENCOUNTER — Other Ambulatory Visit: Payer: Self-pay

## 2019-11-20 ENCOUNTER — Ambulatory Visit (INDEPENDENT_AMBULATORY_CARE_PROVIDER_SITE_OTHER): Payer: Medicare Other | Admitting: Podiatry

## 2019-11-20 ENCOUNTER — Encounter: Payer: Self-pay | Admitting: Podiatry

## 2019-11-20 DIAGNOSIS — B351 Tinea unguium: Secondary | ICD-10-CM | POA: Diagnosis not present

## 2019-11-20 DIAGNOSIS — M2041 Other hammer toe(s) (acquired), right foot: Secondary | ICD-10-CM

## 2019-11-20 DIAGNOSIS — E1142 Type 2 diabetes mellitus with diabetic polyneuropathy: Secondary | ICD-10-CM | POA: Diagnosis not present

## 2019-11-20 DIAGNOSIS — M79675 Pain in left toe(s): Secondary | ICD-10-CM | POA: Diagnosis not present

## 2019-11-20 DIAGNOSIS — M79674 Pain in right toe(s): Secondary | ICD-10-CM | POA: Diagnosis not present

## 2019-11-20 DIAGNOSIS — L84 Corns and callosities: Secondary | ICD-10-CM

## 2019-11-20 DIAGNOSIS — M2042 Other hammer toe(s) (acquired), left foot: Secondary | ICD-10-CM

## 2019-11-22 ENCOUNTER — Ambulatory Visit (HOSPITAL_COMMUNITY)
Admission: RE | Admit: 2019-11-22 | Discharge: 2019-11-22 | Disposition: A | Discharge status: Home / Self Care | Payer: Medicare Other | Source: Ambulatory Visit | Attending: Surgery | Admitting: Surgery

## 2019-11-22 ENCOUNTER — Other Ambulatory Visit: Payer: Self-pay

## 2019-11-22 DIAGNOSIS — C189 Malignant neoplasm of colon, unspecified: Secondary | ICD-10-CM | POA: Diagnosis not present

## 2019-11-22 DIAGNOSIS — K862 Cyst of pancreas: Secondary | ICD-10-CM | POA: Diagnosis not present

## 2019-11-22 LAB — POCT I-STAT CREATININE: Creatinine, Ser: 0.8 mg/dL (ref 0.44–1.00)

## 2019-11-22 MED ORDER — DIPHENHYDRAMINE HCL 25 MG PO CAPS
ORAL_CAPSULE | ORAL | Status: AC
  Filled 2019-11-22: qty 2

## 2019-11-22 MED ORDER — IOHEXOL 300 MG/ML  SOLN
100.0000 mL | Freq: Once | INTRAMUSCULAR | Status: AC | PRN
  Administered 2019-11-22: 100 mL via INTRAVENOUS

## 2019-11-22 MED ORDER — SODIUM CHLORIDE (PF) 0.9 % IJ SOLN
INTRAMUSCULAR | Status: AC
  Filled 2019-11-22: qty 100

## 2019-11-22 NOTE — Progress Notes (Signed)
Subjective: [name] presents today at risk foot care with history of diabetic neuropathy, at risk foot care. Patient has history of ulceration of right 4th digit, painful corn(s) b/l 2nd toes, callus(es) b/l feet and painful mycotic nails.  Pain interferes with ambulation. Aggravating factors include wearing enclosed shoe gear.. She relates no new pedal concerns on today's visit.  Her caregiver is present during today's visit.  Bartholome Bill, MD is patient's PCP. Last visit was: 09/05/2019.  Past Medical History:  Diagnosis Date  . Anemia   . Anxiety   . Breast cancer ([place]) 2014   right breast  . Cancer of right breast ([place]) 12/26/12   right breast 12:00 o'clock, DCIS  . Chronic cough   . Chronic facial pain   . Chronic foot pain   . Complication of anesthesia    Sore jaw; could not chew or move mouth  . Convulsions/seizures ([place]) 10/16/2014  . Diabetes mellitus    type 2 niddm x 20 years  . Dyslipidemia   . Ejection fraction   . GERD (gastroesophageal reflux disease)   . Hammer toe    bilateral  . History of colonic polyps   . HTN (hypertension)   . Hx of radiation therapy 03/07/13- 03/29/13   right breast 4250 cGy 17 sessions  . Hyperlipidemia   . Melanoma ([place])   . Metatarsal bone fracture 2014  . Multiple drug allergies   . Obesity   . Osteoarthritis   . Palpitations   . Personal history of radiation therapy 2014  . Seasonal allergies   . Skin cancer   . Tremor, essential 08/18/2016  . Vitamin B12 deficiency      Current Outpatient Medications on File Prior to Visit  Medication Sig Dispense Refill  . acyclovir (ZOVIRAX) 400 MG tablet Take 400 mg by mouth 3 (three) times daily.    Marland Kitchen ALPRAZolam (XANAX) 0.25 MG tablet TAKE 1 TABLET AT BEDTIME AS NEEDED (CAN TAKE 1 TABLET DURING THE DAY IF NEEDED FOR ANXIETY) 60 tablet 5  . Biotin 1000 MCG tablet Take 1,000 mcg by mouth daily.     . Blood Glucose Monitoring Suppl (PRODIGY VOICE BLOOD GLUCOSE) w/Device KIT Use  to check blood sugar 1 time per day. 1 each 2  . Cholecalciferol (VITAMIN D3 PO) Take 1 tablet by mouth in the morning and at bedtime.    . cholestyramine (QUESTRAN) 4 g packet Take 4 g by mouth in the morning and at bedtime.    . clindamycin (CLEOCIN) 150 MG capsule Take 150 mg by mouth 3 (three) times daily.    . cyanocobalamin (,VITAMIN B-12,) 1000 MCG/ML injection INJECT 1 ML INTRAMUSCULARLY EVERY 21 DAYS (Patient taking differently: Inject 1,000 mcg into the muscle every 21 ( twenty-one) days. ) 3 mL 2  . diclofenac Sodium (VOLTAREN) 1 % GEL Apply 1 application topically 4 (four) times daily as needed (pain.).     Marland Kitchen diphenhydrAMINE (BENADRYL) 25 MG tablet Take 25-50 mg by mouth every 6 (six) hours as needed (allergies.).    Marland Kitchen docusate sodium (COLACE) 100 MG capsule Take 200 mg by mouth 2 (two) times daily as needed (constipation.).     Marland Kitchen EPINEPHrine 0.3 mg/0.3 mL IJ SOAJ injection Inject 0.3 mLs (0.3 mg total) into the muscle as needed for anaphylaxis. 2 each 0  . escitalopram (LEXAPRO) 10 MG tablet Take 10 mg by mouth daily.    . furosemide (LASIX) 20 MG tablet Take 20 mg by mouth daily as needed (  swelling/fluid retention.).     Marland Kitchen glucose blood test strip Use to check blood sugar 1 time per day. 100 each 3  . insulin aspart (NOVOLOG) 100 UNIT/ML FlexPen Inject 2-3 Units into the skin in the morning, at noon, and at bedtime. Sliding Scale Insulin    . insulin glargine (LANTUS) 100 UNIT/ML injection Inject 14 Units into the skin at bedtime.     . Insulin Pen Needle 32G X 4 MM MISC Used to inject insulin 3x daily 270 each 2  . levothyroxine (SYNTHROID) 75 MCG tablet Take 75 mcg by mouth daily before breakfast.    . lidocaine (LIDODERM) 5 % Place 1 patch onto the skin daily. Remove & Discard patch within 12 hours or as directed by MD (Patient taking differently: Place 1-2 patches onto the skin daily as needed (pain). Remove & Discard patch within 12 hours or as directed by MD) 90 patch 1  .  lidocaine (XYLOCAINE) 2 % solution 1 mL 4 (four) times daily.    . metroNIDAZOLE (FLAGYL) 500 MG tablet Take 500 mg by mouth 3 (three) times daily.    Marland Kitchen neomycin (MYCIFRADIN) 500 MG tablet Take 1,000 mg by mouth 3 (three) times daily.    . phenytoin (DILANTIN) 100 MG ER capsule Take 1 capsule (100 mg) in the morning and Take 2 capsules (200 mg) at bedtime (Patient taking differently: Take 100-200 mg by mouth See admin instructions. Take 1 capsule (100 mg) in the morning & Take 2 capsules (200 mg) at bedtime) 270 capsule 3  . phenytoin (DILANTIN) 50 MG tablet Chew 50 mg by mouth daily. Total morning dose=150 mg    . Polyethyl Glycol-Propyl Glycol 0.4-0.3 % SOLN Place 2 drops into both eyes 2 (two) times a day.     . polyethylene glycol-electrolytes (NULYTELY) 420 g solution Take by mouth as directed.    . predniSONE (DELTASONE) 50 MG tablet Take by mouth.    . primidone (MYSOLINE) 50 MG tablet Take 1 tablets by mouth in the AM, 3 tablet at dinner. (Patient taking differently: Take 50-150 mg by mouth See admin instructions. Take 3 tablets (150 mg) by mouth in the morning, take 1 tablet (50 mg) by mouth at noon, & take 1 tablet (50 mg) by mouth at night.) 30 tablet 0  . traMADol (ULTRAM) 50 MG tablet Take 50 mg by mouth every 6 (six) hours as needed (pain.).     Marland Kitchen trandolapril-verapamil (TARKA) 2-240 MG tablet Take 1 tablet by mouth at bedtime.      No current facility-administered medications on file prior to visit.     Allergies  Allergen Reactions  . Bystolic [Nebivolol Hcl] Other (See Comments)    "extreme weakness, heaviness in legs & arms, swelling in legs/arms/face, swollen abdomen, pain in bladder, feet pain, soreness in chest"  . Cholestyramine     "itching rash on stomach, bloated, nausea, vomiting, sleeplessness, extreme pain in arms"  . Hydrazine Yellow [Tartrazine] Other (See Comments)    "does not reduce high blood pressure, pain in arm, high pressure, felt like I was on verge of  heart attack, really weak"  . Morphine Other (See Comments)    Feels morbid, weak, still in pain  . Niacin Palpitations    Fast heart beat  . Niaspan [Niacin Er] Palpitations and Other (See Comments)    "fast heart beat, high blood pressure"  . Norvasc [Amlodipine Besylate] Other (See Comments)    "extreme fluid retention/pain)  . Optivar [Azelastine Hcl] Photosensitivity  .  Repaglinide Hives  . Sular [Nisoldipine Er] Other (See Comments)    "severe headaches, swelling eyes, hands, feet, shortness of breath, weak, flushed face, brain boiling, fluid retention, high blood sugar, nervous, heart fast beating"  . Tegretol [Carbamazepine] Other (See Comments)    Blood poisoning   . Telmisartan Other (See Comments)    "headache, difficulty urinating, high blood sugar, fluid retention"  . Amoxicillin-Pot Clavulanate Rash  . Cefdinir Swelling    Vaginal irritation, breathing,   . Ciprofloxacin Hcl Hives  . Clonidine Other (See Comments)    Dry mouth, fluid retention  . Clonidine Hydrochloride Other (See Comments)    Dry mouth, fluid retention  . Codeine Nausea And Vomiting  . Ezetimibe Other (See Comments)    Made weak  . Hydroxychloroquine Other (See Comments)    Low platlets  . Naproxen Other (See Comments)    Shrinks bladder  . Sulfa Antibiotics Rash  . Ziac [Bisoprolol-Hydrochlorothiazide] Other (See Comments)    "stopped urination"  . Azelastine Other (See Comments)  . Elavil [Amitriptyline] Other (See Comments)    Gave Pt nightmares  . Empagliflozin Other (See Comments)    "Caused yeast infection, slowed my urine"  . Hydralazine   . Iodine   . Kenalog [Triamcinolone] Diarrhea  . Keppra [Levetiracetam] Other (See Comments)    Shaking  . Lamictal [Lamotrigine]     itching  . Lyrica [Pregabalin] Swelling  . Pseudoephedrine   . Topamax [Topiramate]     Dry eyes  . Ace Inhibitors Other (See Comments)    unknown  . Actonel [Risedronate Sodium] Other (See Comments)     unknown  . Amlodipine Besylate Other (See Comments)    unknown  . Aspirin Other (See Comments)    unknown  . Atacand [Candesartan] Other (See Comments)    unknown  . Bextra [Valdecoxib] Other (See Comments)    unknown  . Bisoprolol-Hydrochlorothiazide Other (See Comments)    unknown  . Candesartan Cilexetil Other (See Comments)    unknown  . Cefadroxil Other (See Comments)    unknown  . Celecoxib Rash  . Hydrocodone Other (See Comments)    unknown  . Hydrocodone-Acetaminophen Other (See Comments)    unknown  . Iodinated Diagnostic Agents Rash    "All over" "All over"  . Meloxicam Other (See Comments)    unknown  . Methylprednisolone Sodium Succinate Other (See Comments)    unknown  . Nabumetone Other (See Comments)    unknown  . Penicillins Other (See Comments)    unknown  . Pseudoephedrine-Guaifenesin Other (See Comments)    unknown  . Risedronate Sodium Other (See Comments)    unknown  . Rofecoxib Other (See Comments)    unknown  . Ru-Tuss [Chlorphen-Pse-Atrop-Hyos-Scop] Other (See Comments)    unknown  . Sulfonamide Derivatives Other (See Comments)    unknown  . Sulphur [Sulfur] Other (See Comments)    unknown  . Telithromycin Other (See Comments)    unknown  . Terfenadine Other (See Comments)    unknown  . Trandolapril-Verapamil Hcl Er Other (See Comments)    Headache, difficulty urinating, high blood sugar, fluid retention  Pt is taking Tarka (trandolapril-verapamil) currently, but requests the medication stay in her allergy list  . Trandolapril-Verapamil Hcl Er Other (See Comments)    Headache, difficulty urinating, high blood sugar, fluid retention  Pt is taking Tarka (trandolapril-verapamil) currently, but requests the medication stay in her allergy list  . Valium [Diazepam] Other (See Comments)    Makes her mean  and hyper    Objective: [name] is a pleasant 86 y.o. Caucasian female, WD, WN in NAD.  AAO x 3.   There were no vitals filed for this  visit.   Vascular Examination: Neurovascular status unchanged b/l. Capillary fill time to digits <3 seconds b/l. Faintly palpable pedal pulses b/l. Pedal hair absent b/l Skin temperature gradient within normal limits b/l. No edema noted b/l.  Dermatological Examination: Pedal skin with normal turgor, texture and tone bilaterally. No open wounds bilaterally. No interdigital macerations bilaterally. Toenails 1-5 b/l elongated, discolored, dystrophic, thickened, crumbly with subungual debris and tenderness to dorsal palpation. Hyperkeratotic lesion(s) L 2nd toe, R 2nd toe, R 4th toe, submet head 1 left foot and submet head 1 right foot.  No erythema, no edema, no drainage, no flocculence.  Musculoskeletal: Normal muscle strength 5/5 to all lower extremity muscle groups bilaterally. No gross bony deformities bilaterally. No pain crepitus or joint limitation noted with ROM b/l. Hammertoes noted to the 2-5 bilaterally.  Neurological Examination: Protective sensation diminished with 10g monofilament b/l.  Assessment: 1. Pain due to onychomycosis of toenails of both feet   2. Corns and callosities   3. Acquired hammertoes of both feet   4. Diabetic peripheral neuropathy associated with type 2 diabetes mellitus ([place])    Plan: -Examined patient. -Continue diabetic foot care principles. -Toenails 1-5 b/l were debrided in length and girth with sterile nail nippers and dremel without iatrogenic bleeding.  -Corn(s) L 2nd toe, R 2nd toe and R 4th toe pared utilizing sterile scalpel blade without complication or incident. Total number debrided=3. -Callus(es) submet head 1 left foot and submet head 1 right foot pared utilizing sterile scalpel blade without complication or incident. Total number debrided =2. -Patient to report any pedal injuries to medical professional immediately. -Patient to continue soft, supportive shoe gear daily. -Patient/POA to call should there be question/concern in the  interim.  Return in about 9 weeks (around 01/22/2020) for diabetic nail and callus trim.  Marzetta Board, DPM

## 2019-11-28 ENCOUNTER — Encounter (HOSPITAL_COMMUNITY): Payer: Self-pay

## 2019-11-28 ENCOUNTER — Other Ambulatory Visit: Payer: Self-pay

## 2019-11-28 ENCOUNTER — Ambulatory Visit (HOSPITAL_COMMUNITY)
Admission: RE | Admit: 2019-11-28 | Discharge: 2019-11-28 | Disposition: A | Discharge status: Home / Self Care | Payer: Medicare Other | Source: Ambulatory Visit | Attending: Surgery | Admitting: Surgery

## 2019-11-28 DIAGNOSIS — J9811 Atelectasis: Secondary | ICD-10-CM | POA: Diagnosis not present

## 2019-11-28 DIAGNOSIS — M4854XA Collapsed vertebra, not elsewhere classified, thoracic region, initial encounter for fracture: Secondary | ICD-10-CM | POA: Diagnosis not present

## 2019-11-28 DIAGNOSIS — C189 Malignant neoplasm of colon, unspecified: Secondary | ICD-10-CM | POA: Diagnosis not present

## 2019-11-28 DIAGNOSIS — K838 Other specified diseases of biliary tract: Secondary | ICD-10-CM | POA: Diagnosis not present

## 2019-11-28 DIAGNOSIS — I700 Atherosclerosis of aorta: Secondary | ICD-10-CM | POA: Diagnosis not present

## 2019-11-28 HISTORY — DX: Malignant neoplasm of colon, unspecified: C18.9

## 2019-11-28 MED ORDER — SODIUM CHLORIDE (PF) 0.9 % IJ SOLN
INTRAMUSCULAR | Status: AC
  Filled 2019-11-28: qty 50

## 2019-11-28 MED ORDER — IOHEXOL 300 MG/ML  SOLN
75.0000 mL | Freq: Once | INTRAMUSCULAR | Status: AC | PRN
  Administered 2019-11-28: 75 mL via INTRAVENOUS

## 2019-12-04 ENCOUNTER — Encounter: Payer: Self-pay | Admitting: Neurology

## 2019-12-04 ENCOUNTER — Other Ambulatory Visit: Payer: Self-pay

## 2019-12-04 ENCOUNTER — Ambulatory Visit (INDEPENDENT_AMBULATORY_CARE_PROVIDER_SITE_OTHER): Payer: Medicare Other | Admitting: Neurology

## 2019-12-04 VITALS — BP 117/69 | HR 68 | Ht 67.5 in | Wt 135.0 lb

## 2019-12-04 DIAGNOSIS — R569 Unspecified convulsions: Secondary | ICD-10-CM

## 2019-12-04 DIAGNOSIS — R269 Unspecified abnormalities of gait and mobility: Secondary | ICD-10-CM

## 2019-12-04 HISTORY — DX: Unspecified abnormalities of gait and mobility: R26.9

## 2019-12-04 MED ORDER — PRIMIDONE 50 MG PO TABS: ORAL_TABLET | ORAL | 1 refills | Status: DC

## 2019-12-04 NOTE — Progress Notes (Signed)
Reason for visit: Essential tremor, gait disturbance  [name] is an 86 y.o. female  History of present illness:  [name] is an 86 year old right-handed white female with a history of a chronic gait disorder.  The patient has had some increased gait instability over the last year or so, she has had several recent falls and has bumped her head on one occasion.  The patient [name] fall forward or backwards.  The patient underwent a CT scan of the brain recently that showed no acute changes.  The patient has a walker at home but does not use it on a regular basis.  She has not had any recent physical therapy.  She recently was noted to have a significant iron deficiency anemia and work-up revealed evidence of colon cancer.  She will be having surgery in the near future.  She is on primidone and Dilantin, recent levels were low.  She feels that the primidone does help her tremor, her brother has a similar tremor.  Past Medical History:  Diagnosis Date  . Anemia   . Anxiety   . Breast cancer ([place]) 2014   right breast  . Cancer of right breast ([place]) 12/26/12   right breast 12:00 o'clock, DCIS  . Chronic cough   . Chronic facial pain   . Chronic foot pain   . Colon cancer ([place]) dx'd 11/2019  . Complication of anesthesia    Sore jaw; could not chew or move mouth  . Convulsions/seizures ([place]) 10/16/2014  . Diabetes mellitus    type 2 niddm x 20 years  . Dyslipidemia   . Ejection fraction   . GERD (gastroesophageal reflux disease)   . Hammer toe    bilateral  . History of colonic polyps   . HTN (hypertension)   . Hx of radiation therapy 03/07/13- 03/29/13   right breast 4250 cGy 17 sessions  . Hyperlipidemia   . Melanoma ([place])   . Metatarsal bone fracture 2014  . Multiple drug allergies   . Obesity   . Osteoarthritis   . Palpitations   . Personal history of radiation therapy 2014  . Seasonal allergies   . Skin cancer   . Tremor, essential 08/18/2016  . Vitamin B12 deficiency      Past Surgical History:  Procedure Laterality Date  . ABDOMINAL HYSTERECTOMY    . BIOPSY  11/07/2019   Procedure: BIOPSY;  Surgeon: Ronald Lobo, MD;  Location: [facility];  Service: Endoscopy;;  . BRAIN SURGERY    . BREAST BIOPSY Right 01/24/2013   Procedure: RE-EXCICION OF BREAST CANCER, ANTERIOR MARGINS;  Surgeon: Edward Jolly, MD;  Location: [facility];  Service: General;  Laterality: Right;  . BREAST LUMPECTOMY Right 2014  . BREAST LUMPECTOMY WITH NEEDLE LOCALIZATION Right 01/17/2013   Procedure: BREAST LUMPECTOMY WITH NEEDLE LOCALIZATION;  Surgeon: Edward Jolly, MD;  Location: [facility];  Service: General;  Laterality: Right;  . BUNIONECTOMY    . CATARACT EXTRACTION W/ INTRAOCULAR LENS IMPLANT Right   . CHOLECYSTECTOMY    . COLONOSCOPY    . COLONOSCOPY WITH PROPOFOL N/A 11/07/2019   Procedure: COLONOSCOPY WITH PROPOFOL;  Surgeon: Ronald Lobo, MD;  Location: [facility];  Service: Endoscopy;  Laterality: N/A;  . CRANIOTOMY Right 10/18/2013   Procedure: CRANIOTOMY TUMOR EXCISION;  Surgeon: Floyce Stakes, MD;  Location: [facility];  Service: Neurosurgery;  Laterality: Right;  . ESOPHAGOGASTRODUODENOSCOPY (EGD) WITH PROPOFOL N/A 11/07/2019   Procedure: ESOPHAGOGASTRODUODENOSCOPY (EGD) WITH PROPOFOL;  Surgeon: Buccini,  Herbie Baltimore, MD;  Location: [facility];  Service: Endoscopy;  Laterality: N/A;  . EYE SURGERY    . HERNIA REPAIR    . KNEE ARTHROSCOPY Bilateral   . POLYPECTOMY     small adenomatous  . ULNAR TUNNEL RELEASE      Family History  Problem Relation Age of Onset  . Heart disease Mother   . Osteoporosis Mother   . Diabetes Father   . Pancreatic cancer Father   . Colon cancer Other   . Bone cancer Sister   . Prostate cancer Brother   . Colon cancer Brother   . Rectal cancer Sister   . Thyroid disease Sister        benign goiter resected    Social history:  reports that she has never smoked. She has never used smokeless tobacco. She reports that she  does not drink alcohol and does not use drugs.    Allergies  Allergen Reactions  . Bystolic [Nebivolol Hcl] Other (See Comments)    "extreme weakness, heaviness in legs & arms, swelling in legs/arms/face, swollen abdomen, pain in bladder, feet pain, soreness in chest"  . Cholestyramine     "itching rash on stomach, bloated, nausea, vomiting, sleeplessness, extreme pain in arms"  . Hydrazine Yellow [Tartrazine] Other (See Comments)    "does not reduce high blood pressure, pain in arm, high pressure, felt like I was on verge of heart attack, really weak"  . Morphine Other (See Comments)    Feels morbid, weak, still in pain  . Niacin Palpitations    Fast heart beat  . Niaspan [Niacin Er] Palpitations and Other (See Comments)    "fast heart beat, high blood pressure"  . Norvasc [Amlodipine Besylate] Other (See Comments)    "extreme fluid retention/pain)  . Optivar [Azelastine Hcl] Photosensitivity  . Repaglinide Hives  . Sular [Nisoldipine Er] Other (See Comments)    "severe headaches, swelling eyes, hands, feet, shortness of breath, weak, flushed face, brain boiling, fluid retention, high blood sugar, nervous, heart fast beating"  . Tegretol [Carbamazepine] Other (See Comments)    Blood poisoning   . Telmisartan Other (See Comments)    "headache, difficulty urinating, high blood sugar, fluid retention"  . Amoxicillin-Pot Clavulanate Rash  . Cefdinir Swelling    Vaginal irritation, breathing,   . Ciprofloxacin Hcl Hives  . Clonidine Other (See Comments)    Dry mouth, fluid retention  . Clonidine Hydrochloride Other (See Comments)    Dry mouth, fluid retention  . Codeine Nausea And Vomiting  . Ezetimibe Other (See Comments)    Made weak  . Hydroxychloroquine Other (See Comments)    Low platlets  . Naproxen Other (See Comments)    Shrinks bladder  . Sulfa Antibiotics Rash  . Ziac [Bisoprolol-Hydrochlorothiazide] Other (See Comments)    "stopped urination"  . Azelastine Other  (See Comments)  . Elavil [Amitriptyline] Other (See Comments)    Gave Pt nightmares  . Empagliflozin Other (See Comments)    "Caused yeast infection, slowed my urine"  . Hydralazine   . Iodine   . Kenalog [Triamcinolone] Diarrhea  . Keppra [Levetiracetam] Other (See Comments)    Shaking  . Lamictal [Lamotrigine]     itching  . Lyrica [Pregabalin] Swelling  . Pseudoephedrine   . Topamax [Topiramate]     Dry eyes  . Ace Inhibitors Other (See Comments)    unknown  . Actonel [Risedronate Sodium] Other (See Comments)    unknown  . Amlodipine Besylate Other (See Comments)  unknown  . Aspirin Other (See Comments)    unknown  . Atacand [Candesartan] Other (See Comments)    unknown  . Bextra [Valdecoxib] Other (See Comments)    unknown  . Bisoprolol-Hydrochlorothiazide Other (See Comments)    unknown  . Candesartan Cilexetil Other (See Comments)    unknown  . Cefadroxil Other (See Comments)    unknown  . Celecoxib Rash  . Hydrocodone Other (See Comments)    unknown  . Hydrocodone-Acetaminophen Other (See Comments)    unknown  . Iodinated Diagnostic Agents Rash    "All over" "All over"  . Meloxicam Other (See Comments)    unknown  . Methylprednisolone Sodium Succinate Other (See Comments)    unknown  . Nabumetone Other (See Comments)    unknown  . Penicillins Other (See Comments)    unknown  . Pseudoephedrine-Guaifenesin Other (See Comments)    unknown  . Risedronate Sodium Other (See Comments)    unknown  . Rofecoxib Other (See Comments)    unknown  . Ru-Tuss [Chlorphen-Pse-Atrop-Hyos-Scop] Other (See Comments)    unknown  . Sulfonamide Derivatives Other (See Comments)    unknown  . Sulphur [Sulfur] Other (See Comments)    unknown  . Telithromycin Other (See Comments)    unknown  . Terfenadine Other (See Comments)    unknown  . Trandolapril-Verapamil Hcl Er Other (See Comments)    Headache, difficulty urinating, high blood sugar, fluid retention  Pt is  taking Tarka (trandolapril-verapamil) currently, but requests the medication stay in her allergy list  . Trandolapril-Verapamil Hcl Er Other (See Comments)    Headache, difficulty urinating, high blood sugar, fluid retention  Pt is taking Tarka (trandolapril-verapamil) currently, but requests the medication stay in her allergy list  . Valium [Diazepam] Other (See Comments)    Makes her mean and hyper    Medications:  Prior to Admission medications   Medication Sig Start Date End Date Taking? Authorizing Provider  ALPRAZolam (XANAX) 0.25 MG tablet TAKE 1 TABLET AT BEDTIME AS NEEDED (CAN TAKE 1 TABLET DURING THE DAY IF NEEDED FOR ANXIETY) 11/14/19  Yes Ward Givens, NP  Biotin 1000 MCG tablet Take 1,000 mcg by mouth daily.    Yes [provider]  Blood Glucose Monitoring Suppl (PRODIGY VOICE BLOOD GLUCOSE) w/Device KIT Use to check blood sugar 1 time per day. 10/18/15  Yes Renato Shin, MD  Cholecalciferol (VITAMIN D3 PO) Take 1 tablet by mouth in the morning and at bedtime.   Yes [provider]  cholestyramine (QUESTRAN) 4 g packet Take 4 g by mouth in the morning and at bedtime.   Yes [provider]  cyanocobalamin (,VITAMIN B-12,) 1000 MCG/ML injection INJECT 1 ML INTRAMUSCULARLY EVERY 21 DAYS Patient taking differently: Inject 1,000 mcg into the muscle every 21 ( twenty-one) days.  06/18/16  Yes Hoyt Koch, MD  diclofenac Sodium (VOLTAREN) 1 % GEL Apply 1 application topically 4 (four) times daily as needed (pain.).  06/01/19  Yes [provider]  diphenhydrAMINE (BENADRYL) 25 MG tablet Take 25-50 mg by mouth every 6 (six) hours as needed (allergies.).   Yes [provider]  docusate sodium (COLACE) 100 MG capsule Take 200 mg by mouth 2 (two) times daily as needed (constipation.).    Yes [provider]  EPINEPHrine 0.3 mg/0.3 mL IJ SOAJ injection Inject 0.3 mLs (0.3 mg total) into the muscle as needed for anaphylaxis. 02/06/19   Yes Deno Etienne, DO  escitalopram (LEXAPRO) 10 MG tablet Take 10 mg  by mouth daily.   Yes [provider]  furosemide (LASIX) 20 MG tablet Take 20 mg by mouth daily as needed (swelling/fluid retention.).  11/28/18  Yes [provider]  glucose blood test strip Use to check blood sugar 1 time per day. 10/22/15  Yes Renato Shin, MD  insulin aspart (NOVOLOG) 100 UNIT/ML FlexPen Inject 2-3 Units into the skin in the morning, at noon, and at bedtime. Sliding Scale Insulin 06/02/19  Yes [provider]  insulin glargine (LANTUS) 100 UNIT/ML injection Inject 14 Units into the skin at bedtime.    Yes [provider]  Insulin Pen Needle 32G X 4 MM MISC Used to inject insulin 3x daily 11/19/16  Yes Renato Shin, MD  levothyroxine (SYNTHROID) 75 MCG tablet Take 75 mcg by mouth daily before breakfast.   Yes [provider]  lidocaine (LIDODERM) 5 % Place 1 patch onto the skin daily. Remove & Discard patch within 12 hours or as directed by MD Patient taking differently: Place 1-2 patches onto the skin daily as needed (pain). Remove & Discard patch within 12 hours or as directed by MD 01/25/15  Yes Hoyt Koch, MD  lidocaine (XYLOCAINE) 2 % solution 1 mL 4 (four) times daily. 11/04/19  Yes [provider]  phenytoin (DILANTIN) 100 MG ER capsule Take 1 capsule (100 mg) in the morning and Take 2 capsules (200 mg) at bedtime Patient taking differently: Take 100-200 mg by mouth See admin instructions. Take 1 capsule (100 mg) in the morning & Take 2 capsules (200 mg) at bedtime 05/16/19  Yes Ward Givens, NP  phenytoin (DILANTIN) 50 MG tablet Chew 50 mg by mouth daily. Total morning dose=150 mg   Yes [provider]  Polyethyl Glycol-Propyl Glycol (SYSTANE OP) Apply to eye in the morning, at noon, and at bedtime.   Yes [provider]  primidone (MYSOLINE) 50 MG tablet Take 1 tablets by mouth in the AM, 3 tablet at dinner. Patient taking  differently: Take 50-150 mg by mouth See admin instructions. Take 3 tablets (150 mg) by mouth in the morning, take 1 tablet (50 mg) by mouth at noon, & take 1 tablet (50 mg) by mouth at night. 10/13/19  Yes Kathrynn Ducking, MD  traMADol (ULTRAM) 50 MG tablet Take 50 mg by mouth every 6 (six) hours as needed (pain.).    Yes [provider]  trandolapril-verapamil (TARKA) 2-240 MG tablet Take 1 tablet by mouth at bedtime.    Yes [provider]  acyclovir (ZOVIRAX) 400 MG tablet Take 400 mg by mouth 3 (three) times daily. 11/04/19   [provider]  clindamycin (CLEOCIN) 150 MG capsule Take 150 mg by mouth 3 (three) times daily. 11/04/19   [provider]  metroNIDAZOLE (FLAGYL) 500 MG tablet Take 500 mg by mouth 3 (three) times daily. Patient not taking: Reported on 12/04/2019 11/14/19   [provider]  neomycin (MYCIFRADIN) 500 MG tablet Take 1,000 mg by mouth 3 (three) times daily. Patient not taking: Reported on 12/04/2019 11/15/19   [provider]  polyethylene glycol-electrolytes (NULYTELY) 420 g solution Take by mouth as directed. Patient not taking: Reported on 12/04/2019 10/18/19   [provider]    ROS:  Out of a complete 14 system review of symptoms, the patient complains only of the following symptoms, and all other reviewed systems are negative.  Tremor Walking difficulty  Blood pressure 117/69, pulse 68, height 5' 7.5" (1.715 m), weight 135 lb (61.2 kg).  Physical Exam  General: The patient is alert and cooperative at the time of the examination.  Skin: No significant peripheral edema is noted.   Neurologic Exam  Mental status: The patient is alert and oriented x 3 at the time of the examination. The patient has apparent normal recent and remote memory, with an apparently normal attention span and concentration ability.   Cranial nerves: Facial symmetry is present. Speech is normal, no aphasia or dysarthria is noted.  Extraocular movements are full. Visual fields are full.  Motor: The patient has good strength in all 4 extremities.  Sensory examination: Soft touch sensation is symmetric on the face, arms, and legs.  Coordination: The patient has good finger-nose-finger and heel-to-shin bilaterally.  Mild tremors noted with finger-nose-finger bilaterally.  Minimal tremors noted translated into handwriting with drawing a spiral.  Gait and station: The patient has a slightly wide-based gait.  The patient is able to walk independently.  I have some slight instability with turns.  Romberg is negative but is unsteady.  Reflexes: Deep tendon reflexes are symmetric.   CT brain 10/31/19:  IMPRESSION:   CT head (without) demonstrating: - Stable, right frontal encephalomalacia and overlying craniotomy.   * CT scan images were reviewed online. I agree with the written report.    Assessment/Plan:  1.  Chronic gait disorder  2.  Essential tremor  The patient is had some worsening of gait, she will be having colon surgery in the near future, once she is healed up from this she will call our office and I will get her set up for some physical therapy.  She will go up slightly on the Mysoline dose taking 150 mg in the morning and 50 mg later in the day.  She will come back here in 6 months.  Jill Alexanders MD 12/04/2019 11:39 AM  [facility] [address] [place], [place] [address]  Phone [phone] Fax [phone]

## 2019-12-05 DIAGNOSIS — E782 Mixed hyperlipidemia: Secondary | ICD-10-CM | POA: Diagnosis not present

## 2019-12-05 DIAGNOSIS — R9431 Abnormal electrocardiogram [ECG] [EKG]: Secondary | ICD-10-CM | POA: Diagnosis not present

## 2019-12-05 DIAGNOSIS — Z01818 Encounter for other preprocedural examination: Secondary | ICD-10-CM | POA: Diagnosis not present

## 2019-12-05 DIAGNOSIS — D332 Benign neoplasm of brain, unspecified: Secondary | ICD-10-CM | POA: Diagnosis not present

## 2019-12-05 DIAGNOSIS — I6523 Occlusion and stenosis of bilateral carotid arteries: Secondary | ICD-10-CM | POA: Diagnosis not present

## 2019-12-05 DIAGNOSIS — I10 Essential (primary) hypertension: Secondary | ICD-10-CM | POA: Diagnosis not present

## 2019-12-11 ENCOUNTER — Other Ambulatory Visit: Payer: Self-pay | Admitting: Cardiology

## 2019-12-11 DIAGNOSIS — R9431 Abnormal electrocardiogram [ECG] [EKG]: Secondary | ICD-10-CM

## 2019-12-11 DIAGNOSIS — R195 Other fecal abnormalities: Secondary | ICD-10-CM | POA: Insufficient documentation

## 2019-12-11 DIAGNOSIS — Z01810 Encounter for preprocedural cardiovascular examination: Secondary | ICD-10-CM

## 2019-12-11 DIAGNOSIS — I6200 Nontraumatic subdural hemorrhage, unspecified: Secondary | ICD-10-CM | POA: Diagnosis not present

## 2019-12-11 DIAGNOSIS — E782 Mixed hyperlipidemia: Secondary | ICD-10-CM | POA: Diagnosis not present

## 2019-12-11 DIAGNOSIS — I10 Essential (primary) hypertension: Secondary | ICD-10-CM | POA: Diagnosis not present

## 2019-12-12 DIAGNOSIS — M8580 Other specified disorders of bone density and structure, unspecified site: Secondary | ICD-10-CM | POA: Diagnosis not present

## 2019-12-12 DIAGNOSIS — S32001A Stable burst fracture of unspecified lumbar vertebra, initial encounter for closed fracture: Secondary | ICD-10-CM | POA: Diagnosis not present

## 2019-12-12 DIAGNOSIS — Z8781 Personal history of (healed) traumatic fracture: Secondary | ICD-10-CM | POA: Diagnosis not present

## 2019-12-13 ENCOUNTER — Other Ambulatory Visit (HOSPITAL_COMMUNITY): Payer: Self-pay | Admitting: Family Medicine

## 2019-12-13 DIAGNOSIS — R9431 Abnormal electrocardiogram [ECG] [EKG]: Secondary | ICD-10-CM

## 2019-12-13 DIAGNOSIS — Z01818 Encounter for other preprocedural examination: Secondary | ICD-10-CM

## 2019-12-14 ENCOUNTER — Ambulatory Visit (HOSPITAL_COMMUNITY)
Admission: RE | Admit: 2019-12-14 | Discharge: 2019-12-14 | Disposition: A | Discharge status: Home / Self Care | Payer: Medicare Other | Source: Ambulatory Visit | Attending: Cardiology | Admitting: Cardiology

## 2019-12-14 ENCOUNTER — Other Ambulatory Visit: Payer: Self-pay

## 2019-12-14 DIAGNOSIS — Z01818 Encounter for other preprocedural examination: Secondary | ICD-10-CM | POA: Insufficient documentation

## 2019-12-14 DIAGNOSIS — Z85038 Personal history of other malignant neoplasm of large intestine: Secondary | ICD-10-CM | POA: Insufficient documentation

## 2019-12-14 DIAGNOSIS — R9431 Abnormal electrocardiogram [ECG] [EKG]: Secondary | ICD-10-CM | POA: Insufficient documentation

## 2019-12-14 LAB — ECHOCARDIOGRAM COMPLETE
Area-P 1/2: 3.21 cm2
S' Lateral: 2.7 cm

## 2019-12-14 NOTE — Progress Notes (Signed)
  Echocardiogram 2D Echocardiogram has been performed.  [name] 12/14/2019, 11:11 AM

## 2019-12-19 ENCOUNTER — Ambulatory Visit: Payer: Self-pay | Admitting: Surgery

## 2019-12-19 NOTE — H&P (Signed)
CC: Referred by Dr. Kalman Shan for newly diagnosed colon cancer  HPI: [name] is a very pleasant 39yoF with hx of HTN, DM, Seizures (now s/p excision of mass from head, no further seizures but on antiepileptics), who lives alone and gets around at home fine with the assistance of care aids. She has had a multi-month history of loose stool as well as anemia of unclear etiology. She underwent upper and lower endoscopy at Dr. Kalman Shan 11/07/19 which demonstrated a 2 cm polyp in the cecum that was semi-sessile and biopsied. Additionally, a mass in the proximal ascending colon 3 haustrations above the ileocecal valve that measured 6-8 cm across. Rather mobile, soft, fleshy with some ulceration. Biopsy returned invasive adenocarcinoma. The cecal polyp returned as a villous adenoma. She was referred to [place]. She denies any complaints today side from her baseline bowel changes with loose or watery stool. She denies nausea/vomiting or abdominal pain.  PMH: HTN, DM, Seizures (now s/p excision of mass from head, no further seizures but on antiepileptics); breast cancer (s/p Lumpectomy/SLN Dr. Excell Seltzer, 2014)  PSH: Laparoscopic cholecystectomy (07/17/1999, Dr. Lennie Hummer); TAH via low midline incision; ?R inguinal hernia repair many years ago  FHx: Reports her sister had rectal cancer and her father had pancreatic cancer  Social: Denies use of tobacco/EtOH/drugs. She is happily retired  ROS: A comprehensive 10 system review of systems was completed with the patient and pertinent findings as noted above.  The patient is a 86 year old female.   Allergies (Jacqueline Haggett, RMA; 08/10/8117 1:47 PM) Bystolic *BETA BLOCKERS*  Cholestyramine *ANTIHYPERLIPIDEMICS*  Itching, Rash. Hydrazine Sulfate *CHEMICALS*  Morphine Sulfate (PF) *ANALGESICS - OPIOID*  Allergies Reconciled  Niacin *VITAMINS*  Codeine Phosphate *ANALGESICS - OPIOID*  Sular *Calcium Channel Blockers**  Morphine Sulfate *ANALGESICS  - OPIOID*  TEGretol *ANTICONVULSANTS*  Cefdinir *CEPHALOSPORINS*  Telmisartan *ANTIHYPERTENSIVES*  Keppra *ANTICONVULSANTS*  Actonel *ENDOCRINE AND METABOLIC AGENTS - MISC.*  Ziac *ANTIHYPERTENSIVES*  Niaspan *ANTIHYPERLIPIDEMICS*  Norvasc *CALCIUM CHANNEL BLOCKERS*  Optivar *OPHTHALMIC AGENTS*  Repaglinide *ANTIDIABETICS*  Hives. cloNIDine *ANTIHYPERTENSIVES*  Ezetimibe *ANTIHYPERLIPIDEMICS*  Naproxen *ANALGESICS - ANTI-INFLAMMATORY*  Elavil *ANTIDEPRESSANTS*  Kenalog *CORTICOSTEROIDS*  Iodine (Antiseptic) *ANTISEPTICS & DISINFECTANTS*  LaMICtal *ANTICONVULSANTS*  Itching. Lyrica *ANTICONVULSANTS*  Pseudoephedrine *NASAL AGENTS - SYSTEMIC AND TOPICAL*  Topamax *ANTICONVULSANTS*  ACE Inhibitors  amLODIPine Bes+SyrSpend SF *CALCIUM CHANNEL BLOCKERS*  Aspirin *ANALGESICS - NonNarcotic*  Atacand *ANTIHYPERTENSIVES*   Medication History [employer], RMA; 11/14/2019 1:42 PM) Cyanocobalamin (1000MCG/ML Solution, Injection) Active. PrednisoLONE Acetate (1% Suspension, Ophthalmic) Active. Repaglinide (0.5MG  Tablet, Oral) Active. Gatifloxacin (0.5% Solution, Ophthalmic) Active. Neomycin-Polymyxin-Dexameth (3.5-10000-0.1 Ointment, Ophthalmic) Active. Colestipol HCl (1GM Tablet, Oral) Active. Lidocaine (5% Patch, External) Active. Diclofenac Sodium (3% Gel, Transdermal) Active. Furosemide (20MG  Tablet, Oral) Active. Xanax (0.25MG  Tablet, Oral) Active. Tarka (2-180MG  Tablet ER, Oral) Active. MetFORMIN HCl ER (500MG  Tablet ER 24HR, Oral) Active. ALPRAZolam (0.25MG  Tablet, Oral) Active. Benadryl Allergy (25MG  Tablet, Oral) Active. Biotin (Oral) Specific strength unknown - Active. Calcitonin ([place]) (200UNIT/ACT Solution, Nasal) Active. traMADol HCl (50MG  Tablet, Oral) Active. Acyclovir (400MG  Tablet, Oral) Active. Colesevelam HCl (625MG  Tablet, Oral) Active. Diclofenac Sodium (75MG  Tablet DR, Oral) Active. Escitalopram Oxalate  (10MG  Tablet, Oral) Active. Imiquimod (5% Cream, External) Active. Jardiance (10MG  Tablet, Oral) Active. Lantus SoloStar (100UNIT/ML Soln Pen-inj, Subcutaneous) Active. Lidocaine Viscous HCl (2% Solution, Mouth/Throat) Active. NovoLOG FlexPen (100UNIT/ML Soln Pen-inj, Subcutaneous) Active. PEG 3350-KCl-Na Bicarb-NaCl (420GM For Solution, Oral) Active. Phenytoin Infatabs (50MG  Tablet Chewable, Oral) Active. Primidone (50MG  Tablet, Oral) Active. Synthroid (75MCG Tablet, Oral) Active. Trandolapril-Verapamil HCl ER (2-240MG  Tablet  ER, Oral) Active. Silver sulfADIAZINE (1% Cream, External) Active. Medications Reconciled    Review of Systems Harrell Gave M. Talvin Christianson MD; 11/14/2019 2:01 PM) General Not Present- Appetite Loss, Chills, Fatigue, Fever, Night Sweats, Weight Gain and Weight Loss. Skin Present- Dryness. Not Present- Change in Wart/Mole, Hives, Jaundice, New Lesions, Non-Healing Wounds, Rash and Ulcer. HEENT Present- Seasonal Allergies, Visual Disturbances and Wears glasses/contact lenses. Not Present- Earache, Hearing Loss, Hoarseness, Nose Bleed, Oral Ulcers, Ringing in the Ears, Sinus Pain, Sore Throat and Yellow Eyes. Breast Not Present- Breast Mass, Breast Pain, Nipple Discharge and Skin Changes. Cardiovascular Present- Swelling of Extremities. Not Present- Chest Pain, Difficulty Breathing Lying Down, Leg Cramps, Palpitations, Rapid Heart Rate and Shortness of Breath. Musculoskeletal Present- Back Pain, Joint Pain, Joint Stiffness, Muscle Pain and Muscle Weakness. Not Present- Swelling of Extremities. Neurological Present- Decreased Memory, Headaches and Weakness. Not Present- Fainting, Numbness, Seizures, Tingling, Tremor and Trouble walking. Psychiatric Present- Anxiety and Change in Sleep Pattern. Not Present- Bipolar, Depression, Fearful and Frequent crying. Endocrine Present- Cold Intolerance and Hair Changes. Not Present- Excessive Hunger, Heat Intolerance, Hot flashes  and New Diabetes. Hematology Not Present- Blood Clots and Blood Thinners.  Vitals [employer] Haggett RMA; 11/14/2019 1:32 PM) 11/14/2019 1:32 PM Weight: 141.4 lb Height: 67.5in Body Surface Area: 1.75 m Body Mass Index: 21.82 kg/m  Temp.: 97.35F (Temporal)  Pulse: 84 (Regular)  P.OX: 90% (Room air) BP: 142/70(Sitting, Left Arm, Standard)       Physical Exam Harrell Gave M. Gamble Enderle MD; 11/14/2019 2:02 PM) The physical exam findings are as follows: Note: Constitutional: No acute distress; conversant; no deformities; wearing mask Eyes: Moist conjunctiva; no lid lag; anicteric sclerae; pupils equal round and reactive to light Neck: Trachea midline; no palpable thyromegaly Lungs: Normal respiratory effort; no tactile fremitus CV: Regular rate and rhythm; no palpable thrill; no pitting edema GI: Abdomen soft, nontender, nondistended; no palpable hepatosplenomegaly MSK: Normal gait; no clubbing/cyanosis Psychiatric: Appropriate affect; alert and oriented 3 Lymphatic: No palpable cervical or axillary lymphadenopathy    Assessment & Plan Harrell Gave M. Miley Lindon MD; 11/14/2019 2:06 PM) COLON CANCER (C18.9) Story: Ms. Heap is a very pleasant 15yoF with hx of HTN, DM, Seizures (now s/p excision of mass from head, no further seizures but on antiepileptics), breast cancer - here for evaluation of newly diagnosed colon cancer of ascending colon + endoscopically unresectable villous adenoma of cecum  -The anatomy and physiology of the GI tract was discussed at length with the patient. The pathophysiology of colon polyps and cancer was discussed at length with associated pictures. -Staging CT Chest/Abd/Pelvis -Preop CEA -Medical clearance for surgery - Dr. Luciana Axe; will defer to primary whether they want her to see cardiologist or not  -We reviewed options going forward. Assuming she is cleared for surgery and has no evidence of metastatic disease on the CAT scans, we discussed surgery as  an option. We discussed this is the most definitive option from a curative standpoint. We also reviewed other treatment modalities including no further treatment versus chemotherapy. We discussed from a surgical standpoint, laparoscopic and potential open techniques to a right hemicolectomy. Lesion was not tattooed per se but hopefully will be apparent on CT scan and additionally, there was good visualization of the cecum including the ileocecal valve and appendiceal orifice and this mass being 3 Haustra from the cecum. -The planned procedure, material risks (including, but not limited to, pain, bleeding, infection, scarring, need for blood transfusion, damage to surrounding structures- blood vessels/nerves/viscus/organs especially with adhesions present, damage to ureter,  urine leak, leak from anastomosis, need for additional procedures, worsening of pre-existing medical conditions, need for stoma which Vandoren be permanent, hernia, recurrence of cancer, DVT/PE, pneumonia, heart attack, stroke, death) benefits and alternatives to surgery were discussed at length. The patient's questions were answered to their satisfaction, they voiced understanding and elected to proceed with surgery. Additionally, we discussed typical postoperative expectations and the recovery process. We also discussed the areas were adjuvant chemotherapy [place] be recommended or indicated

## 2019-12-25 ENCOUNTER — Ambulatory Visit: Payer: Medicare Other

## 2019-12-30 ENCOUNTER — Encounter (HOSPITAL_COMMUNITY): Payer: Self-pay | Admitting: Emergency Medicine

## 2019-12-30 ENCOUNTER — Other Ambulatory Visit: Payer: Self-pay

## 2019-12-30 ENCOUNTER — Emergency Department (HOSPITAL_COMMUNITY): Payer: Medicare Other

## 2019-12-30 ENCOUNTER — Emergency Department (HOSPITAL_COMMUNITY)
Admission: EM | Admit: 2019-12-30 | Discharge: 2020-01-02 | Disposition: A | Discharge status: Skilled Nursing Facility | Payer: Medicare Other | Attending: Emergency Medicine | Admitting: Emergency Medicine

## 2019-12-30 DIAGNOSIS — Z794 Long term (current) use of insulin: Secondary | ICD-10-CM | POA: Diagnosis not present

## 2019-12-30 DIAGNOSIS — S82302A Unspecified fracture of lower end of left tibia, initial encounter for closed fracture: Secondary | ICD-10-CM | POA: Diagnosis not present

## 2019-12-30 DIAGNOSIS — W108XXA Fall (on) (from) other stairs and steps, initial encounter: Secondary | ICD-10-CM | POA: Diagnosis not present

## 2019-12-30 DIAGNOSIS — I10 Essential (primary) hypertension: Secondary | ICD-10-CM | POA: Diagnosis not present

## 2019-12-30 DIAGNOSIS — Y999 Unspecified external cause status: Secondary | ICD-10-CM | POA: Diagnosis not present

## 2019-12-30 DIAGNOSIS — Z79899 Other long term (current) drug therapy: Secondary | ICD-10-CM | POA: Diagnosis not present

## 2019-12-30 DIAGNOSIS — M810 Age-related osteoporosis without current pathological fracture: Secondary | ICD-10-CM | POA: Diagnosis not present

## 2019-12-30 DIAGNOSIS — Z20822 Contact with and (suspected) exposure to covid-19: Secondary | ICD-10-CM | POA: Diagnosis not present

## 2019-12-30 DIAGNOSIS — E119 Type 2 diabetes mellitus without complications: Secondary | ICD-10-CM | POA: Insufficient documentation

## 2019-12-30 DIAGNOSIS — C50911 Malignant neoplasm of unspecified site of right female breast: Secondary | ICD-10-CM | POA: Diagnosis not present

## 2019-12-30 DIAGNOSIS — C4490 Unspecified malignant neoplasm of skin, unspecified: Secondary | ICD-10-CM | POA: Diagnosis not present

## 2019-12-30 DIAGNOSIS — Y9301 Activity, walking, marching and hiking: Secondary | ICD-10-CM | POA: Insufficient documentation

## 2019-12-30 DIAGNOSIS — S99911A Unspecified injury of right ankle, initial encounter: Secondary | ICD-10-CM | POA: Diagnosis present

## 2019-12-30 DIAGNOSIS — M25572 Pain in left ankle and joints of left foot: Secondary | ICD-10-CM | POA: Diagnosis not present

## 2019-12-30 DIAGNOSIS — S82432A Displaced oblique fracture of shaft of left fibula, initial encounter for closed fracture: Secondary | ICD-10-CM | POA: Diagnosis not present

## 2019-12-30 DIAGNOSIS — C189 Malignant neoplasm of colon, unspecified: Secondary | ICD-10-CM | POA: Diagnosis not present

## 2019-12-30 DIAGNOSIS — Y9222 Religious institution as the place of occurrence of the external cause: Secondary | ICD-10-CM | POA: Insufficient documentation

## 2019-12-30 DIAGNOSIS — M6281 Muscle weakness (generalized): Secondary | ICD-10-CM | POA: Insufficient documentation

## 2019-12-30 DIAGNOSIS — S82392A Other fracture of lower end of left tibia, initial encounter for closed fracture: Secondary | ICD-10-CM | POA: Diagnosis not present

## 2019-12-30 DIAGNOSIS — S82232A Displaced oblique fracture of shaft of left tibia, initial encounter for closed fracture: Secondary | ICD-10-CM | POA: Diagnosis not present

## 2019-12-30 LAB — SARS CORONAVIRUS 2 BY RT PCR (HOSPITAL ORDER, PERFORMED IN ~~LOC~~ HOSPITAL LAB): SARS Coronavirus 2: NEGATIVE

## 2019-12-30 MED ORDER — METRONIDAZOLE 500 MG PO TABS
500.0000 mg | ORAL_TABLET | Freq: Three times a day (TID) | ORAL | Status: DC
  Administered 2019-12-30: 500 mg via ORAL
  Filled 2019-12-30: qty 1

## 2019-12-30 MED ORDER — INSULIN ASPART 100 UNIT/ML ~~LOC~~ SOLN
2.0000 [IU] | Freq: Three times a day (TID) | SUBCUTANEOUS | Status: DC | PRN
  Filled 2019-12-30: qty 0.03

## 2019-12-30 MED ORDER — DOCUSATE SODIUM 100 MG PO CAPS: 200.0000 mg | ORAL_CAPSULE | Freq: Two times a day (BID) | ORAL | Status: DC | PRN

## 2019-12-30 MED ORDER — THIAMINE HCL 100 MG PO TABS
250.0000 mg | ORAL_TABLET | Freq: Every day | ORAL | Status: DC
  Administered 2019-12-30 – 2020-01-01 (×3): 250 mg via ORAL
  Filled 2019-12-30 (×3): qty 3

## 2019-12-30 MED ORDER — ALPRAZOLAM 0.25 MG PO TABS
0.2500 mg | ORAL_TABLET | Freq: Every evening | ORAL | Status: DC | PRN
  Administered 2019-12-30 – 2020-01-01 (×2): 0.25 mg via ORAL
  Filled 2019-12-30 (×2): qty 1

## 2019-12-30 MED ORDER — LEVOTHYROXINE SODIUM 50 MCG PO TABS
75.0000 ug | ORAL_TABLET | Freq: Every day | ORAL | Status: DC
  Administered 2019-12-31 – 2020-01-02 (×3): 75 ug via ORAL
  Filled 2019-12-30 (×3): qty 1

## 2019-12-30 MED ORDER — NEOMYCIN SULFATE 500 MG PO TABS: 1000.0000 mg | ORAL_TABLET | Freq: Three times a day (TID) | ORAL | Status: DC

## 2019-12-30 MED ORDER — PRIMIDONE 50 MG PO TABS: 100.0000 mg | ORAL_TABLET | ORAL | Status: DC

## 2019-12-30 MED ORDER — TRAMADOL HCL 50 MG PO TABS
50.0000 mg | ORAL_TABLET | Freq: Once | ORAL | Status: AC
  Administered 2019-12-30: 50 mg via ORAL
  Filled 2019-12-30: qty 1

## 2019-12-30 MED ORDER — ONDANSETRON HCL 4 MG PO TABS
4.0000 mg | ORAL_TABLET | Freq: Once | ORAL | Status: AC
  Administered 2019-12-30: 4 mg via ORAL
  Filled 2019-12-30: qty 1

## 2019-12-30 MED ORDER — TRANDOLAPRIL-VERAPAMIL HCL ER 2-240 MG PO TBCR: 1.0000 | EXTENDED_RELEASE_TABLET | Freq: Every day | ORAL | Status: DC

## 2019-12-30 MED ORDER — VITAMIN D 25 MCG (1000 UNIT) PO TABS
5000.0000 [IU] | ORAL_TABLET | Freq: Two times a day (BID) | ORAL | Status: DC
  Administered 2019-12-31 – 2020-01-02 (×5): 5000 [IU] via ORAL
  Filled 2019-12-30 (×5): qty 5

## 2019-12-30 MED ORDER — TRAMADOL HCL 50 MG PO TABS
50.0000 mg | ORAL_TABLET | Freq: Four times a day (QID) | ORAL | Status: DC | PRN
  Administered 2019-12-31 – 2020-01-01 (×7): 50 mg via ORAL
  Filled 2019-12-30 (×7): qty 1

## 2019-12-30 MED ORDER — ESCITALOPRAM OXALATE 10 MG PO TABS
10.0000 mg | ORAL_TABLET | Freq: Every day | ORAL | Status: DC
  Administered 2019-12-30 – 2020-01-02 (×4): 10 mg via ORAL
  Filled 2019-12-30 (×4): qty 1

## 2019-12-30 MED ORDER — DICLOFENAC SODIUM 1 % EX GEL
2.0000 g | Freq: Four times a day (QID) | CUTANEOUS | Status: DC | PRN
  Filled 2019-12-30: qty 100

## 2019-12-30 NOTE — ED Provider Notes (Signed)
[name] DEPT Provider Note   CSN: [id number] Arrival date & time: 12/30/19  1700     History Chief Complaint  Patient presents with  . Ankle Pain  . Fall    [name] is a 86 y.o. female with past medical history significant for anemia, breast cancer, type 2 diabetes, chronic foot pain, bilateral hammertoe.  Not on anticoagulation.  HPI Patient presents to emergency department today with chief complaint of left ankle pain after a chemical fall happening just prior to arrival.  Patient states she was walking [place] the steps at church when she missed the last step.  She inverted her left ankle and landed on her left knee.  She denies hitting her head or loss of consciousness.  She has not been able to ambulate after the fall secondary to pain.  She is endorsing aching pain located in her left ankle radiating to her shin.  She states the pain is constant and is worse with any kind of movement.  She rates the pain 10 out of 10 in severity.  She did not take any medication for pain prior to arrival.She has a walker at home she occasionally uses. She is asking for tramadol for pain as it is what she takes for pain at home when needed. She has seen Dr. Theda Sers with [facility] for bilateral knee replacements in the past. She denies back pain, numbness, weakness, lower extremity edema.   Chart review also shows patient recent colon cancer diagnosis after biopsy resulted with invasive adenocarcinoma.  She is scheduled for a right hemicolectomy.    Past Medical History:  Diagnosis Date  . Anemia   . Anxiety   . Breast cancer ([place]) 2014   right breast  . Cancer of right breast ([place]) 12/26/12   right breast 12:00 o'clock, DCIS  . Chronic cough   . Chronic facial pain   . Chronic foot pain   . Colon cancer ([place]) dx'd 11/2019  . Complication of anesthesia    Sore jaw; could not chew or move mouth  . Convulsions/seizures ([place]) 10/16/2014  . Diabetes mellitus     type 2 niddm x 20 years  . Dyslipidemia   . Ejection fraction   . Gait abnormality 12/04/2019  . GERD (gastroesophageal reflux disease)   . Hammer toe    bilateral  . History of colonic polyps   . HTN (hypertension)   . Hx of radiation therapy 03/07/13- 03/29/13   right breast 4250 cGy 17 sessions  . Hyperlipidemia   . Melanoma ([place])   . Metatarsal bone fracture 2014  . Multiple drug allergies   . Obesity   . Osteoarthritis   . Palpitations   . Personal history of radiation therapy 2014  . Seasonal allergies   . Skin cancer   . Tremor, essential 08/18/2016  . Vitamin B12 deficiency     Patient Active Problem List   Diagnosis Date Noted  . Gait abnormality 12/04/2019  . Hypokalemia   . Effusion of left knee joint   . Hyponatremia   . Left patella fracture 10/26/2018  . Acquired hypothyroidism 08/25/2017  . 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 Hammer toe 08/25/2017  . History of breast cancer 08/25/2017  . Nontoxic thyroid nodule 08/25/2017  . Osteopetrosis 08/25/2017  . Dysuria 01/19/2017  . Vitamin D deficiency 11/18/2016  . Tremor, essential 08/18/2016  . Convulsions/seizures ([place]) 10/16/2014  . Brain tumor (benign) ([place]) 10/18/2013  . Subdural hemorrhage (  [place]) 10/09/2013  . Anxiety 10/09/2013  . Compression fracture of T12 vertebra ([place]) 10/09/2013  . Ejection fraction   . Multiple thyroid nodules 09/05/2013  . Syncope 08/27/2013  . Carotid artery disease ([place]) 08/27/2013  . Abnormal thyroid ultrasound 08/27/2013  . Cancer of right breast ([place])   . Cancer of central portion of female breast ([place]) 12/30/2012  . Osteoporosis 03/06/2010  . ASTHMA 03/05/2009  . IRRITABLE BOWEL SYNDROME  diarrhea type 03/21/2008  . ANEMIA-NOS 12/16/2006  . GERD 12/16/2006  . Insulin dependent diabetes mellitus 10/29/2006  . Hyperlipidemia 10/29/2006  . Essential hypertension 10/29/2006  . Allergic rhinitis, cause unspecified 10/29/2006  . OSTEOARTHRITIS  10/29/2006    Past Surgical History:  Procedure Laterality Date  . ABDOMINAL HYSTERECTOMY    . BIOPSY  11/07/2019   Procedure: BIOPSY;  Surgeon: Ronald Lobo, MD;  Location: [facility];  Service: Endoscopy;;  . BRAIN SURGERY    . BREAST BIOPSY Right 01/24/2013   Procedure: RE-EXCICION OF BREAST CANCER, ANTERIOR MARGINS;  Surgeon: Edward Jolly, MD;  Location: [facility] ORS;  Service: General;  Laterality: Right;  . BREAST LUMPECTOMY Right 2014  . BREAST LUMPECTOMY WITH NEEDLE LOCALIZATION Right 01/17/2013   Procedure: BREAST LUMPECTOMY WITH NEEDLE LOCALIZATION;  Surgeon: Edward Jolly, MD;  Location: [facility];  Service: General;  Laterality: Right;  . BUNIONECTOMY    . CATARACT EXTRACTION W/ INTRAOCULAR LENS IMPLANT Right   . CHOLECYSTECTOMY    . COLONOSCOPY    . COLONOSCOPY WITH PROPOFOL N/A 11/07/2019   Procedure: COLONOSCOPY WITH PROPOFOL;  Surgeon: Ronald Lobo, MD;  Location: [facility];  Service: Endoscopy;  Laterality: N/A;  . CRANIOTOMY Right 10/18/2013   Procedure: CRANIOTOMY TUMOR EXCISION;  Surgeon: Floyce Stakes, MD;  Location: [facility];  Service: Neurosurgery;  Laterality: Right;  . ESOPHAGOGASTRODUODENOSCOPY (EGD) WITH PROPOFOL N/A 11/07/2019   Procedure: ESOPHAGOGASTRODUODENOSCOPY (EGD) WITH PROPOFOL;  Surgeon: Ronald Lobo, MD;  Location: [facility];  Service: Endoscopy;  Laterality: N/A;  . EYE SURGERY    . HERNIA REPAIR    . KNEE ARTHROSCOPY Bilateral   . POLYPECTOMY     small adenomatous  . ULNAR TUNNEL RELEASE       OB History   No obstetric history on file.     Family History  Problem Relation Age of Onset  . Heart disease Mother   . Osteoporosis Mother   . Diabetes Father   . Pancreatic cancer Father   . Colon cancer Other   . Bone cancer Sister   . Prostate cancer Brother   . Colon cancer Brother   . Rectal cancer Sister   . Thyroid disease Sister        benign goiter resected    Social History   Tobacco Use  . Smoking  status: Never Smoker  . Smokeless tobacco: Never Used  Vaping Use  . Vaping Use: Never used  Substance Use Topics  . Alcohol use: No  . Drug use: No    Home Medications Prior to Admission medications   Medication Sig Start Date End Date Taking? Authorizing Provider  ALPRAZolam (XANAX) 0.25 MG tablet TAKE 1 TABLET AT BEDTIME AS NEEDED (CAN TAKE 1 TABLET DURING THE DAY IF NEEDED FOR ANXIETY) Patient taking differently: Take 0.25 mg by mouth at bedtime as needed for anxiety.  11/14/19   Ward Givens, NP  Biotin 1000 MCG tablet Take 1,000 mcg by mouth daily.     [provider]  Blood Glucose Monitoring Suppl (PRODIGY VOICE  BLOOD GLUCOSE) w/Device KIT Use to check blood sugar 1 time per day. 10/18/15   Renato Shin, MD  Cholecalciferol (VITAMIN D-3) 125 MCG (5000 UT) TABS Take 5,000 Units by mouth in the morning and at bedtime.    [provider]  cholestyramine light (PREVALITE) 4 g packet Take 0.5 packets by mouth in the morning and at bedtime. 11/29/19   [provider]  Cranberry-Vitamin C-Probiotic (AZO CRANBERRY PO) Take 1 tablet by mouth daily.    [provider]  cyanocobalamin (,VITAMIN B-12,) 1000 MCG/ML injection INJECT 1 ML INTRAMUSCULARLY EVERY 21 DAYS Patient taking differently: Inject 1,000 mcg into the muscle every 21 ( twenty-one) days.  06/18/16   Hoyt Koch, MD  diclofenac Sodium (VOLTAREN) 1 % GEL Apply 2 g topically 4 (four) times daily as needed (pain.).  06/01/19   [provider]  diphenhydrAMINE (BENADRYL) 25 MG tablet Take 25-50 mg by mouth every 6 (six) hours as needed (runny nose/allergies.).     [provider]  docusate sodium (COLACE) 100 MG capsule Take 200 mg by mouth 2 (two) times daily as needed (constipation.).     [provider]  escitalopram (LEXAPRO) 10 MG tablet Take 10 mg by mouth daily.    [provider]  glucose blood test strip Use to check blood sugar 1 time per day.  10/22/15   Renato Shin, MD  insulin aspart (NOVOLOG) 100 UNIT/ML FlexPen Inject 2-3 Units into the skin 3 (three) times daily as needed (blood sugars greater than 150=2 units & 200=3 units). Sliding Scale Insulin 06/02/19   [provider]  insulin glargine (LANTUS) 100 UNIT/ML injection Inject 14 Units into the skin at bedtime.     [provider]  Insulin Pen Needle 32G X 4 MM MISC Used to inject insulin 3x daily 11/19/16   Renato Shin, MD  levothyroxine (SYNTHROID) 75 MCG tablet Take 75 mcg by mouth daily before breakfast.    [provider]  lidocaine (LIDODERM) 5 % Place 1 patch onto the skin daily. Remove & Discard patch within 12 hours or as directed by MD Patient taking differently: Place 1-2 patches onto the skin daily as needed (pain). Remove & Discard patch within 12 hours or as directed by MD 01/25/15   Hoyt Koch, MD  metroNIDAZOLE (FLAGYL) 500 MG tablet Take 500 mg by mouth 3 (three) times daily.  11/14/19   [provider]  neomycin (MYCIFRADIN) 500 MG tablet Take 1,000 mg by mouth 3 (three) times daily.  11/15/19   [provider]  phenytoin (DILANTIN) 100 MG ER capsule Take 1 capsule (100 mg) in the morning and Take 2 capsules (200 mg) at bedtime Patient taking differently: Take 100-200 mg by mouth See admin instructions. Take 1 capsule (100 mg) in the morning & Take 2 capsules (200 mg) at bedtime 05/16/19   Ward Givens, NP  phenytoin (DILANTIN) 50 MG tablet Chew 50 mg by mouth daily. Total morning dose=150 mg    [provider]  Polyethyl Glycol-Propyl Glycol (SYSTANE OP) Place 1 drop into both eyes in the morning, at noon, in the evening, and at bedtime.     [provider]  primidone (MYSOLINE) 50 MG tablet Three tablets in the morning and one at dinner time Patient taking differently: Take 100-150 mg by mouth See admin instructions. Take 3 tablets (150 mg) by mouth in the morning & take 2 tablets (100 mg) by  mouth in the afternoon. 12/04/19   Jannifer Franklin,  Elon Alas, MD  silver sulfADIAZINE (SILVADENE) 1 % cream Apply 1 application topically daily as needed (itching.).    [provider]  Thiamine HCl (VITAMIN B-1) 250 MG tablet Take 250 mg by mouth at bedtime.    [provider]  traMADol (ULTRAM) 50 MG tablet Take 50 mg by mouth every 6 (six) hours as needed (pain.).     [provider]  trandolapril-verapamil (TARKA) 2-240 MG tablet Take 1 tablet by mouth at bedtime.     [provider]    Allergies    Bystolic [nebivolol hcl], Cholestyramine, Hydrazine yellow [tartrazine], Morphine, Niacin, Niaspan [niacin er], Norvasc [amlodipine besylate], Optivar [azelastine hcl], Repaglinide, Sular [nisoldipine er], Tegretol [carbamazepine], Telmisartan, Amoxicillin-pot clavulanate, Cefdinir, Ciprofloxacin hcl, Clonidine, Clonidine hydrochloride, Codeine, Ezetimibe, Hydroxychloroquine, Naproxen, Sulfa antibiotics, Ziac [bisoprolol-hydrochlorothiazide], Azelastine, Elavil [amitriptyline], Empagliflozin, Hydralazine, Iodine, Kenalog [triamcinolone], Keppra [levetiracetam], Lamictal [lamotrigine], Lyrica [pregabalin], Pseudoephedrine, Topamax [topiramate], Ace inhibitors, Actonel [risedronate sodium], Amlodipine besylate, Aspirin, Atacand [candesartan], Bextra [valdecoxib], Bisoprolol-hydrochlorothiazide, Candesartan cilexetil, Cefadroxil, Celecoxib, Hydrocodone, Hydrocodone-acetaminophen, Iodinated diagnostic agents, Meloxicam, Methylprednisolone sodium succinate, Nabumetone, Penicillins, Pseudoephedrine-guaifenesin, Risedronate sodium, Rofecoxib, Ru-tuss [chlorphen-pse-atrop-hyos-scop], Sulfonamide derivatives, Sulphur [sulfur], Telithromycin, Terfenadine, Trandolapril-verapamil hcl er, Trandolapril-verapamil hcl er, and Valium [diazepam]  Review of Systems   Review of Systems All other systems are reviewed and are negative for acute change except as noted in the HPI.  Physical Exam  Updated Vital Signs BP 117/76 (BP Location: Left Arm)   Pulse 91   Temp 98.9 F (37.2 C) (Oral)   Resp 18   SpO2 98%   Physical Exam Vitals and nursing note reviewed.  Constitutional:      General: She is not in acute distress.    Appearance: She is not ill-appearing.  HENT:     Head: Normocephalic and atraumatic.     Right Ear: Tympanic membrane and external ear normal.     Left Ear: Tympanic membrane and external ear normal.     Nose: Nose normal.     Mouth/Throat:     Mouth: Mucous membranes are moist.     Pharynx: Oropharynx is clear.  Eyes:     General: No scleral icterus.       Right eye: No discharge.        Left eye: No discharge.     Extraocular Movements: Extraocular movements intact.     Conjunctiva/sclera: Conjunctivae normal.     Pupils: Pupils are equal, round, and reactive to light.  Neck:     Vascular: No JVD.  Cardiovascular:     Rate and Rhythm: Normal rate and regular rhythm.     Pulses: Normal pulses.          Radial pulses are 2+ on the right side and 2+ on the left side.     Heart sounds: Normal heart sounds.  Pulmonary:     Comments: Lungs clear to auscultation in all fields. Symmetric chest rise. No wheezing, rales, or rhonchi. Abdominal:     Comments: Abdomen is soft, non-distended, and non-tender in all quadrants. No rigidity, no guarding. No peritoneal signs.  Musculoskeletal:     Cervical back: Normal range of motion.     Comments: Tenderness to palpation of   Skin:    General: Skin is warm and dry.     Capillary Refill: Capillary refill takes less than 2 seconds.  Neurological:     Mental Status: She is oriented to person, place, and time.     GCS: GCS eye subscore is 4. GCS verbal subscore is 5.  GCS motor subscore is 6.     Comments: Fluent speech, no facial droop.  Psychiatric:        Behavior: Behavior normal.     ED Results / Procedures / Treatments   Labs (all labs ordered are listed, but only abnormal results are displayed)  Labs Reviewed  SARS CORONAVIRUS 2 BY RT PCR (HOSPITAL ORDER, [facility])    EKG None  Radiology DG Tibia/Fibula Left  Result Date: 12/30/2019 CLINICAL DATA:  Fall with ankle pain EXAM: LEFT TIBIA AND FIBULA - 2 VIEW COMPARISON:  Left knee radiographs dated 10/25/2018 FINDINGS: There is an acute, mildly displaced oblique fracture of the distal tibia. This appears to extend to the tibiofibular syndesmosis. There is no ankle joint dislocation. The bones are diffusely demineralized. IMPRESSION: Acute, mildly displaced oblique fracture of the distal tibia. Electronically Signed   By: Zerita Boers M.D.   On: 12/30/2019 18:09   DG Ankle Complete Left  Result Date: 12/30/2019 CLINICAL DATA:  Fall with ankle pain. EXAM: LEFT ANKLE COMPLETE - 3+ VIEW COMPARISON:  Foot radiographs dated 01/04/2013. FINDINGS: There is an acute, mildly displaced oblique fracture of the distal tibia. This appears to extend to the tibiofibular syndesmosis. There is no ankle joint dislocation. The bones are diffusely demineralized. A plantar calcaneal enthesophyte is noted. IMPRESSION: Acute, mildly displaced oblique fracture of the distal tibia. Electronically Signed   By: Zerita Boers M.D.   On: 12/30/2019 17:53   DG Foot Complete Left  Result Date: 12/30/2019 CLINICAL DATA:  Fall with ankle pain. EXAM: LEFT FOOT - COMPLETE 3+ VIEW COMPARISON:  Foot radiographs dated 01/04/2013. FINDINGS: An oblique fracture of the distal tibia is partially imaged. Chronic appearing fractures of the distal second through fifth metatarsals are seen. The bones are diffusely demineralized. A plantar calcaneal enthesophyte is noted. There is no ankle joint dislocation. IMPRESSION: 1. Partially imaged oblique fracture of the distal tibia. Electronically Signed   By: Zerita Boers M.D.   On: 12/30/2019 17:54    Procedures Procedures (including critical care time)  Medications Ordered in ED Medications  ALPRAZolam  Duanne Moron) tablet 0.25 mg (0.25 mg Oral Given 12/30/19 2154)  metroNIDAZOLE (FLAGYL) tablet 500 mg (500 mg Oral Given 12/30/19 2153)  neomycin (MYCIFRADIN) tablet 1,000 mg (has no administration in time range)  levothyroxine (SYNTHROID) tablet 75 mcg (has no administration in time range)  Vitamin D-3 TABS 5,000 Units (has no administration in time range)  escitalopram (LEXAPRO) tablet 10 mg (10 mg Oral Given 12/30/19 2153)  docusate sodium (COLACE) capsule 200 mg (has no administration in time range)  diclofenac Sodium (VOLTAREN) 1 % topical gel 2 g (has no administration in time range)  insulin aspart (NOVOLOG) FlexPen 2-3 Units (has no administration in time range)  primidone (MYSOLINE) tablet 100-150 mg (has no administration in time range)  thiamine tablet 250 mg (250 mg Oral Given 12/30/19 2153)  traMADol (ULTRAM) tablet 50 mg (has no administration in time range)  trandolapril-verapamil (TARKA) 2-240 MG per CR tablet 1 tablet (has no administration in time range)  traMADol (ULTRAM) tablet 50 mg (50 mg Oral Given 12/30/19 2016)  ondansetron (ZOFRAN) tablet 4 mg (4 mg Oral Given 12/30/19 2016)    ED Course  I have reviewed the triage vital signs and the nursing notes.  Pertinent labs & imaging results that were available during my care of the patient were reviewed by me and considered in my medical decision making (see chart for details).  MDM Rules/Calculators/A&P                           History provided by patient with additional history obtained from chart review.    Patient presenting after mechanic fall. Not on anticoagulation, without head injury. Alert and oriented x 3. Left lateral malleolus tender to palpation and tender to palpation of distal tib fib. She is neurovascularly intact, strong DP pulses bilaterally. Compartments soft in left lower extremity. Xray shows acute, mildly displaced oblique fracture of the distal tibia. This appears to extend to the tibiofibular syndesmosis.     Case discussed with on call orthopedist Dr. Onnie Graham who viewed patient's images. He is recommending non operative treatment. Patient should be placed in a Cam walker and be non weight bearing. She will need to f/u ortho in 1 week outpatient for repeat xrays.  Patient does not feel she can care for herself at home given she would have to be non weight bearing and she uses a walker.  Patient will need to board overnight for PT eval. Home medications and diabetic diet ordered.Findings and plan of care discussed with supervising physician Dr. Regenia Skeeter.   Portions of this note were generated with [employer]. Dictation errors [place]ves occur despite best attempts at proofreading.   Final Clinical Impression(s) / ED Diagnoses Final diagnoses:  Closed fracture of distal end of left tibia, unspecified fracture morphology, initial encounter    Rx / DC Orders ED Discharge Orders    None       Flint Melter 12/30/19 2209    Sherwood Gambler, MD 12/30/19 2251

## 2019-12-30 NOTE — ED Triage Notes (Signed)
Patient here from home reporting that she fell down stairs and injured left ankle. Unable to bear weight. Denies hitting head.

## 2019-12-30 NOTE — Discharge Instructions (Addendum)
You need to wear the cam walker boot and not put any weight on your left leg.  Follow up with [facility] in 1 week to have repeat xrays

## 2019-12-31 ENCOUNTER — Other Ambulatory Visit: Payer: Self-pay

## 2019-12-31 ENCOUNTER — Encounter (HOSPITAL_COMMUNITY): Payer: Self-pay | Admitting: Emergency Medicine

## 2019-12-31 DIAGNOSIS — S82302A Unspecified fracture of lower end of left tibia, initial encounter for closed fracture: Secondary | ICD-10-CM | POA: Diagnosis not present

## 2019-12-31 LAB — CBG MONITORING, ED
Glucose-Capillary: 159 mg/dL — ABNORMAL HIGH (ref 70–99)
Glucose-Capillary: 149 mg/dL — ABNORMAL HIGH (ref 70–99)

## 2019-12-31 MED ORDER — PRIMIDONE 50 MG PO TABS
100.0000 mg | ORAL_TABLET | Freq: Every evening | ORAL | Status: DC
  Administered 2019-12-31 – 2020-01-01 (×2): 100 mg via ORAL
  Filled 2019-12-31 (×3): qty 2

## 2019-12-31 MED ORDER — PRIMIDONE 50 MG PO TABS
150.0000 mg | ORAL_TABLET | Freq: Every morning | ORAL | Status: DC
  Administered 2019-12-31 – 2020-01-02 (×3): 150 mg via ORAL
  Filled 2019-12-31 (×3): qty 3

## 2019-12-31 MED ORDER — PHENYTOIN SODIUM EXTENDED 100 MG PO CAPS
100.0000 mg | ORAL_CAPSULE | Freq: Every morning | ORAL | Status: DC
  Administered 2020-01-01 – 2020-01-02 (×2): 100 mg via ORAL
  Filled 2019-12-31: qty 1

## 2019-12-31 MED ORDER — VERAPAMIL HCL ER 240 MG PO TBCR
240.0000 mg | EXTENDED_RELEASE_TABLET | Freq: Every day | ORAL | Status: DC
  Administered 2019-12-31 – 2020-01-01 (×3): 240 mg via ORAL
  Filled 2019-12-31 (×3): qty 1

## 2019-12-31 MED ORDER — PHENYTOIN SODIUM EXTENDED 100 MG PO CAPS
200.0000 mg | ORAL_CAPSULE | Freq: Every day | ORAL | Status: DC
  Administered 2019-12-31 – 2020-01-01 (×2): 200 mg via ORAL
  Filled 2019-12-31 (×3): qty 2

## 2019-12-31 MED ORDER — TRANDOLAPRIL 2 MG PO TABS
2.0000 mg | ORAL_TABLET | Freq: Every day | ORAL | Status: DC
  Administered 2019-12-31 – 2020-01-01 (×3): 2 mg via ORAL
  Filled 2019-12-31 (×3): qty 1

## 2019-12-31 NOTE — Evaluation (Signed)
Physical Therapy Evaluation Patient Details Name: [name] MRN: [id number] DOB: 1930/02/03 Today's Date: 12/31/2019   History of Present Illness  86 yo female admitted with L distal tibia fx after falling at home. Ortho consulted-non op management with CAM boot and NWB status. Hx of anemia, breat ca, bil TKA, chronic foot pain, falls.  Clinical Impression  On eval in ED, pt required Max-Total Assist +2 for mobility. She was able to sit EOB for ~3-4 minutes with Min guard assist and external support of L LE. Pt reports 10/10 pain with activity. She required some encouragement to participate with PT (had recently been moved by nursing to another room and was still painful from that experience). Discussed d/c plan-pt does not wish to go to a SNF but she realizes she cannot manage at home alone in her current condition. Recommendation is for ST SNF for continued rehab.     Follow Up Recommendations SNF    Equipment Recommendations  None recommended by PT    Recommendations for Other Services       Precautions / Restrictions Precautions Precautions: Fall Required Braces or Orthoses:  (CAM boot L LE) Restrictions Weight Bearing Restrictions: Yes LLE Weight Bearing: Non weight bearing Other Position/Activity Restrictions: CAM boot      Mobility  Bed Mobility Overal bed mobility: Needs Assistance Bed Mobility: Supine to Sit;Sit to Supine     Supine to sit: Total assist;+2 for physical assistance;+2 for safety/equipment;HOB elevated Sit to supine: Max assist;+2 for physical assistance;+2 for safety/equipment   General bed mobility comments: Assist for trunk and bil LEs. Utilized bedpad to aid with scooting, positining. Increased time. Cues for breathing, technique. Pt sat EOB for ~3-4 minutes with Min guard assist and support of L LE.  Transfers                 General transfer comment: NT-pt unable 2*pain  Ambulation/Gait                Stairs             Wheelchair Mobility    Modified Rankin (Stroke Patients Only)       Balance Overall balance assessment: History of Falls;Needs assistance Sitting-balance support: Bilateral upper extremity supported;Feet unsupported Sitting balance-Leahy Scale: Poor                                       Pertinent Vitals/Pain Pain Assessment: Faces Faces Pain Scale: Hurts worst Pain Location: L lower leg Pain Descriptors / Indicators: Moaning;Grimacing;Crying Pain Intervention(s): Monitored during session;Repositioned;Limited activity within patient's tolerance    Home Living Family/patient expects to be discharged to:: Unsure Living Arrangements: Alone   Type of Home: House         Home Equipment: Walker - standard;Toilet riser;Grab bars - toilet;Grab bars - tub/shower      Prior Function Level of Independence: Independent               Hand Dominance        Extremity/Trunk Assessment   Upper Extremity Assessment Upper Extremity Assessment: Generalized weakness    Lower Extremity Assessment Lower Extremity Assessment: Generalized weakness    Cervical / Trunk Assessment Cervical / Trunk Assessment: Kyphotic  Communication   Communication: No difficulties  Cognition Arousal/Alertness: Awake/alert Behavior During Therapy: WFL for tasks assessed/performed Overall Cognitive Status: Within Functional Limits for tasks assessed  General Comments      Exercises     Assessment/Plan    PT Assessment Patient needs continued PT services  PT Problem List Decreased strength;Decreased mobility;Decreased activity tolerance;Decreased balance;Decreased knowledge of use of DME;Decreased range of motion;Pain;Decreased knowledge of precautions       PT Treatment Interventions Gait training;DME instruction;Therapeutic activities;Therapeutic exercise;Balance training;Functional mobility training    PT  Goals (Current goals can be found in the Care Plan section)  Acute Rehab PT Goals Patient Stated Goal: less pain PT Goal Formulation: With patient Time For Goal Achievement: 01/14/20 Potential to Achieve Goals: Good    Frequency Min 2X/week   Barriers to discharge Decreased caregiver support      Co-evaluation               AM-PAC PT "6 Clicks" Mobility  Outcome Measure Help needed turning from your back to your side while in a flat bed without using bedrails?: Total Help needed moving from lying on your back to sitting on the side of a flat bed without using bedrails?: Total Help needed moving to and from a bed to a chair (including a wheelchair)?: Total Help needed standing up from a chair using your arms (e.g., wheelchair or bedside chair)?: Total Help needed to walk in hospital room?: Total Help needed climbing 3-5 steps with a railing? : Total 6 Click Score: 6    End of Session   Activity Tolerance: Patient limited by pain Patient left: in bed;with call bell/phone within reach   PT Visit Diagnosis: Pain;Muscle weakness (generalized) (M62.81);History of falling (Z91.81) Pain - Right/Left: Left Pain - part of body: Ankle and joints of foot;Leg    Time: 0950-1010 PT Time Calculation (min) (ACUTE ONLY): 20 min   Charges:   PT Evaluation $PT Eval Low Complexity: Center, PT Acute Rehabilitation  Office: [phone] Pager: [phone]

## 2020-01-01 DIAGNOSIS — S82302A Unspecified fracture of lower end of left tibia, initial encounter for closed fracture: Secondary | ICD-10-CM | POA: Diagnosis not present

## 2020-01-01 LAB — HEMOGLOBIN A1C
Hgb A1c MFr Bld: 7.3 % — ABNORMAL HIGH (ref 4.8–5.6)
Mean Plasma Glucose: 162.81 mg/dL

## 2020-01-01 LAB — CBG MONITORING, ED
Glucose-Capillary: 194 mg/dL — ABNORMAL HIGH (ref 70–99)
Glucose-Capillary: 163 mg/dL — ABNORMAL HIGH (ref 70–99)
Glucose-Capillary: 230 mg/dL — ABNORMAL HIGH (ref 70–99)

## 2020-01-01 MED ORDER — INSULIN ASPART 100 UNIT/ML ~~LOC~~ SOLN
0.0000 [IU] | Freq: Three times a day (TID) | SUBCUTANEOUS | Status: DC
  Administered 2020-01-01 (×2): 1 [IU] via SUBCUTANEOUS
  Administered 2020-01-02: 2 [IU] via SUBCUTANEOUS
  Filled 2020-01-01: qty 0.06

## 2020-01-01 MED ORDER — PHENYTOIN 50 MG PO CHEW
50.0000 mg | CHEWABLE_TABLET | ORAL | Status: DC
  Administered 2020-01-01 – 2020-01-02 (×3): 50 mg via ORAL
  Filled 2020-01-01 (×3): qty 1

## 2020-01-01 MED ORDER — INSULIN ASPART 100 UNIT/ML ~~LOC~~ SOLN
0.0000 [IU] | Freq: Every day | SUBCUTANEOUS | Status: DC
  Administered 2020-01-01: 2 [IU] via SUBCUTANEOUS
  Filled 2020-01-01: qty 0.05

## 2020-01-01 MED ORDER — ENOXAPARIN SODIUM 40 MG/0.4ML ~~LOC~~ SOLN
40.0000 mg | SUBCUTANEOUS | Status: DC
  Administered 2020-01-01: 40 mg via SUBCUTANEOUS
  Filled 2020-01-01: qty 0.4

## 2020-01-01 NOTE — Progress Notes (Signed)
Per notes, pt has a PASRR:  [id number]

## 2020-01-01 NOTE — NC FL2 (Signed)
[place] LEVEL OF CARE SCREENING TOOL     IDENTIFICATION  Patient Name: [name] Birthdate: 1929/06/07 Sex: female Admission Date (Current Location): 12/30/2019  [facility] and [place] Number:  Herbalist and Address:  [facility],  [place] [address], [place]      Provider Number: [id number]  Attending Physician Name and Address:  Default, Provider, MD  Relative Name and Phone Number:       Current Level of Care: Hospital Recommended Level of Care: [place] Prior Approval Number:    Date Approved/Denied:   PASRR Number: [id number]  Discharge Plan: SNF    Current Diagnoses: Patient Active Problem List   Diagnosis Date Noted  . Gait abnormality 12/04/2019  . Hypokalemia   . Effusion of left knee joint   . Hyponatremia   . Left patella fracture 10/26/2018  . Acquired hypothyroidism 08/25/2017  . 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 Hammer toe 08/25/2017  . History of breast cancer 08/25/2017  . Nontoxic thyroid nodule 08/25/2017  . Osteopetrosis 08/25/2017  . Dysuria 01/19/2017  . Vitamin D deficiency 11/18/2016  . Tremor, essential 08/18/2016  . Convulsions/seizures ([place]) 10/16/2014  . Brain tumor (benign) ([place]) 10/18/2013  . Subdural hemorrhage ([place]) 10/09/2013  . Anxiety 10/09/2013  . Compression fracture of T12 vertebra ([place]) 10/09/2013  . Ejection fraction   . Multiple thyroid nodules 09/05/2013  . Syncope 08/27/2013  . Carotid artery disease ([place]) 08/27/2013  . Abnormal thyroid ultrasound 08/27/2013  . Cancer of right breast ([place])   . Cancer of central portion of female breast ([place]) 12/30/2012  . Osteoporosis 03/06/2010  . ASTHMA 03/05/2009  . IRRITABLE BOWEL SYNDROME  diarrhea type 03/21/2008  . ANEMIA-NOS 12/16/2006  . GERD 12/16/2006  . Insulin dependent diabetes mellitus 10/29/2006  . Hyperlipidemia 10/29/2006  . Essential hypertension 10/29/2006  . Allergic  rhinitis, cause unspecified 10/29/2006  . OSTEOARTHRITIS 10/29/2006    Orientation RESPIRATION BLADDER Height & Weight     Self, Time, Situation, Place  Normal Incontinent Weight: 135 lb (61.2 kg) Height:  5' 7.5" (171.5 cm)  BEHAVIORAL SYMPTOMS/MOOD NEUROLOGICAL BOWEL NUTRITION STATUS      Incontinent Diet (Carb-modified)  AMBULATORY STATUS COMMUNICATION OF NEEDS Skin   Extensive Assist Verbally Normal                       Personal Care Assistance Level of Assistance  Bathing, Dressing Bathing Assistance: Limited assistance   Dressing Assistance: Limited assistance     Functional Limitations Info             SPECIAL CARE FACTORS FREQUENCY  PT (By licensed PT), OT (By licensed OT)     PT Frequency: 5 OT Frequency: 5            Contractures Contractures Info: Not present    Additional Factors Info  Code Status, Allergies Code Status Info: FULL CODE Allergies Info: Bystolic (Nebivolol Hcl), Cholestyramine, Hydrazine Yellow (Tartrazine), Morphine, Niacin, Niaspan (Niacin Er), Norvasc (Amlodipine Besylate), Optivar (Azelastine Hcl), Repaglinide, Sular (Nisoldipine Er), Tegretol (Carbamazepin), Telmisartan, Amoxicillin-pot Clavulanate, Cefdinir, Ciprofloxacin Hcl, Clonidine, Clonidine Hydrochloride, Codeine, Ezetimibe, Hydroxychloroquine, Naproxen, Sulfa Antibiotics, Ziac (Bisoprolol-hydrochlorothiazide), Azelastine, Elavil (Amitriptyline), Empagliflozin, Hydralazine, Iodine, Kenalog (Triamcinolone), Keppra (Levetiracetam), Lamictal (Lamotrigine), Lyrica (Pregabalin), Pseudoephedrine, Topamax (Topiramate), Ace Inhibitors, Actonel (Risedronate Sodium), Amlodipine Besylate, Aspirin, Atacand (Candesartan), Bextra (Valdecoxib), Bisoprolol-hydrochlorothiazide, Candesartan Cilexetil, Cefadroxil, Celecoxib, Hydrocodone, Hydrocodone-acetaminophen, Iodinated Diagnostic Agents, Meloxicam, Methylprednisolone Sodium Succinate, Nabumetone, Penicillins, Pseudoephedrine-guaifenesin,  Risedronate Sodium,  Rofecoxib, Ru-tuss (Chlorphen-pse-atrop-hyos-scop), Sulfonamide Derivatives, Sulphur (Sulfur), Telithromycin, Terfenadine, Trandolapril-verapamil Hcl Er, Trandolapril-verapamil Hcl Er, Valium (Diazepam)           Current Medications (01/01/2020):  This is the current hospital active medication list Current Facility-Administered Medications  Medication Dose Route Frequency Provider Last Rate Last Admin  . ALPRAZolam Duanne Moron) tablet 0.25 mg  0.25 mg Oral QHS PRN Albrizze, Kaitlyn E, PA-C   0.25 mg at 01/01/20 2245  . cholecalciferol (VITAMIN D3) tablet 5,000 Units  5,000 Units Oral BID Albrizze, Kaitlyn E, PA-C   5,000 Units at 01/01/20 2245  . diclofenac Sodium (VOLTAREN) 1 % topical gel 2 g  2 g Topical QID PRN Albrizze, Kaitlyn E, PA-C      . docusate sodium (COLACE) capsule 200 mg  200 mg Oral BID PRN Albrizze, Kaitlyn E, PA-C      . enoxaparin (LOVENOX) injection 40 mg  40 mg Subcutaneous Q24H Lacretia Leigh, MD   40 mg at 01/01/20 1154  . escitalopram (LEXAPRO) tablet 10 mg  10 mg Oral Daily Albrizze, Kaitlyn E, PA-C   10 mg at 01/01/20 4503  . insulin aspart (novoLOG) injection 0-5 Units  0-5 Units Subcutaneous QHS Lacretia Leigh, MD   2 Units at 01/01/20 2245  . insulin aspart (novoLOG) injection 0-6 Units  0-6 Units Subcutaneous TID WC Lacretia Leigh, MD   1 Units at 01/01/20 1658  . levothyroxine (SYNTHROID) tablet 75 mcg  75 mcg Oral QAC breakfast Albrizze, Kaitlyn E, PA-C   75 mcg at 01/01/20 8882  . phenytoin (DILANTIN) chewable tablet 50 mg  50 mg Oral Daily Lacretia Leigh, MD   50 mg at 01/01/20 1223  . phenytoin (DILANTIN) ER capsule 100 mg  100 mg Oral q AM Pattricia Boss, MD   100 mg at 01/01/20 0927  . phenytoin (DILANTIN) ER capsule 200 mg  200 mg Oral QHS Pattricia Boss, MD   200 mg at 01/01/20 2246  . primidone (MYSOLINE) tablet 100 mg  100 mg Oral QPM Albrizze, Kaitlyn E, PA-C   100 mg at 01/01/20 1801  . primidone (MYSOLINE) tablet 150 mg  150 mg Oral q  morning - 10a Albrizze, Kaitlyn E, PA-C   150 mg at 01/01/20 0929  . thiamine tablet 250 mg  250 mg Oral QHS Albrizze, Kaitlyn E, PA-C   250 mg at 01/01/20 2246  . traMADol (ULTRAM) tablet 50 mg  50 mg Oral Q6H PRN Albrizze, Kaitlyn E, PA-C   50 mg at 01/01/20 1655  . trandolapril (MAVIK) tablet 2 mg  2 mg Oral QHS Albrizze, Kaitlyn E, PA-C   2 mg at 01/01/20 2246   And  . verapamil (CALAN-SR) CR tablet 240 mg  240 mg Oral QHS Albrizze, Kaitlyn E, PA-C   240 mg at 01/01/20 2246   Current Outpatient Medications  Medication Sig Dispense Refill  . ALPRAZolam (XANAX) 0.25 MG tablet TAKE 1 TABLET AT BEDTIME AS NEEDED (CAN TAKE 1 TABLET DURING THE DAY IF NEEDED FOR ANXIETY) (Patient taking differently: Take 0.25 mg by mouth at bedtime as needed for anxiety. ) 60 tablet 5  . Biotin 1000 MCG tablet Take 1,000 mcg by mouth daily.     . Blood Glucose Monitoring Suppl (PRODIGY VOICE BLOOD GLUCOSE) w/Device KIT Use to check blood sugar 1 time per day. 1 each 2  . Cholecalciferol (VITAMIN D-3) 125 MCG (5000 UT) TABS Take 5,000 Units by mouth in the morning and at bedtime.    . cholestyramine light (PREVALITE) 4  g packet Take 0.5 packets by mouth in the morning and at bedtime.    . Cranberry-Vitamin C-Probiotic (AZO CRANBERRY PO) Take 1 tablet by mouth daily.    . cyanocobalamin (,VITAMIN B-12,) 1000 MCG/ML injection INJECT 1 ML INTRAMUSCULARLY EVERY 21 DAYS (Patient taking differently: Inject 1,000 mcg into the muscle every 21 ( twenty-one) days. ) 3 mL 2  . diclofenac Sodium (VOLTAREN) 1 % GEL Apply 2 g topically 4 (four) times daily as needed (pain.).     Marland Kitchen diphenhydrAMINE (BENADRYL) 25 MG tablet Take 25-50 mg by mouth every 6 (six) hours as needed (runny nose/allergies.).     Marland Kitchen docusate sodium (COLACE) 100 MG capsule Take 200 mg by mouth 2 (two) times daily as needed (constipation.).     Marland Kitchen escitalopram (LEXAPRO) 10 MG tablet Take 10 mg by mouth daily.    Marland Kitchen glucose blood test strip Use to check blood sugar  1 time per day. 100 each 3  . insulin aspart (NOVOLOG) 100 UNIT/ML FlexPen Inject 2-3 Units into the skin 3 (three) times daily as needed (blood sugars greater than 150=2 units & 200=3 units). Sliding Scale Insulin    . insulin glargine (LANTUS) 100 UNIT/ML injection Inject 14 Units into the skin at bedtime.     . Insulin Pen Needle 32G X 4 MM MISC Used to inject insulin 3x daily 270 each 2  . levothyroxine (SYNTHROID) 75 MCG tablet Take 75 mcg by mouth daily before breakfast.    . phenytoin (DILANTIN) 100 MG ER capsule Take 1 capsule (100 mg) in the morning and Take 2 capsules (200 mg) at bedtime (Patient taking differently: Take 100-200 mg by mouth See admin instructions. Take 1 capsule (100 mg) in the morning & Take 2 capsules (200 mg) at bedtime) 270 capsule 3  . phenytoin (DILANTIN) 50 MG tablet Chew 50 mg by mouth daily. Total morning dose=150 mg    . Polyethyl Glycol-Propyl Glycol (SYSTANE OP) Place 1 drop into both eyes in the morning, at noon, in the evening, and at bedtime.     . primidone (MYSOLINE) 50 MG tablet Three tablets in the morning and one at dinner time (Patient taking differently: Take 100-150 mg by mouth See admin instructions. Take 3 tablets (150 mg) by mouth in the morning & take 2 tablets (100 mg) by mouth in the afternoon.) 360 tablet 1  . silver sulfADIAZINE (SILVADENE) 1 % cream Apply 1 application topically daily as needed (itching.).    Marland Kitchen Thiamine HCl (VITAMIN B-1) 250 MG tablet Take 250 mg by mouth at bedtime.    . traMADol (ULTRAM) 50 MG tablet Take 50 mg by mouth every 6 (six) hours as needed (pain.).     Marland Kitchen trandolapril-verapamil (TARKA) 2-240 MG tablet Take 1 tablet by mouth at bedtime.     . lidocaine (LIDODERM) 5 % Place 1 patch onto the skin daily. Remove & Discard patch within 12 hours or as directed by MD (Patient taking differently: Place 1-2 patches onto the skin daily as needed (pain). Remove & Discard patch within 12 hours or as directed by MD) 90 patch 1  .  metroNIDAZOLE (FLAGYL) 500 MG tablet Take 500 mg by mouth 3 (three) times daily.     Marland Kitchen neomycin (MYCIFRADIN) 500 MG tablet Take 1,000 mg by mouth 3 (three) times daily.        Discharge Medications: Please see discharge summary for a list of discharge medications.  Relevant Imaging Results:  Relevant Lab Results:   Additional  Information [id number]  Alphonse Guild Onnie Hatchel, LCSW

## 2020-01-01 NOTE — Progress Notes (Signed)
Assessment completed, verbal permisson obtained from pt,, referrals/signed fL-2 sent to area facilities (SNF) for review.  COVOD test completed 8/28 (negative) and PT recommendation for SNF obtained on 8/29 and sent w/referrals.  CSW spoke to Tammy at [facility] who will review pt's referral.  CSW will continue to follow for D/C needs.  Alphonse Guild. Nellie Chevalier  MSW, LCSW, LCAS, CCS Transitions of Care Clinical Social Worker Care Coordination Department Ph: [phone]

## 2020-01-01 NOTE — Progress Notes (Signed)
Orthopedic Tech Progress Note Patient Details:  [name] [birth year] [id number]  Ortho Devices Type of Ortho Device: CAM walker Ortho Device/Splint Location: left Ortho Device/Splint Interventions: Adjustment   Post Interventions Patient Tolerated: Well   [name] 01/01/2020, 9:32 AM

## 2020-01-01 NOTE — TOC Initial Note (Signed)
Transition of Care [facility]) - Initial/Assessment Note    Patient Details  Name: [name] MRN: [id number] Date of Birth: 06-Mar-1930  Transition of Care [facility]) CM/SW Contact:    Claudine Mouton, LCSW Phone Number: 01/01/2020, 11:01 PM  Clinical Narrative:   CSW spoke to pt who stated she lived alone and could not ambulate, nor transition easily and had no one in the home who could care for her at night as her caregivers only worked linited hours during the day.  Pt stated she could not live alone in her current state and chooses SNF care and states it is her only option.  CSW spoke with pt and confirmed pt's plan to be discharged to SNF to for rehab at discharge.  CSW provided active listening and validated pt's concerns that she be given as many choices as possible but conceded she had been to a couple of them and did not like them as a rule.   Pt did commit to going however. CSW was given permission to complete FL-2 and send referrals out to SNF facilities via the hub, per pt's request.  Pt has been living with the help of two caregivers that had limited hours in which to help her during the day only prior to being admitted to [facility] ED.                Expected Discharge Plan: Skilled Nursing Facility Barriers to Discharge: Insurance Authorization   Patient Goals and CMS Choice Patient states their goals for this hospitalization and ongoing recovery are:: To regain strength ands return home      Expected Discharge Plan and Services Expected Discharge Plan: [place] arrangements for the past 2 months: Single Family Home                                      Prior Living Arrangements/Services Living arrangements for the past 2 months: Single Family Home Lives with:: Self Patient language and need for interpreter reviewed:: No Do you feel safe going back to the place where you live?: No      Need for Family Participation in Patient Care: No  (Comment) Care giver support system in place?: No (comment) Current home services: Other (comment) (Aides/caregivers)    Activities of Daily Living      Permission Sought/Granted Permission sought to share information with : Chartered certified accountant granted to share information with : Yes, Verbal Permission Granted              Emotional Assessment Appearance:: Appears stated age   Affect (typically observed): Flat, Calm, Anxious, Appropriate Orientation: : Oriented to Place, Oriented to Self, Oriented to  Time, Oriented to Situation      Admission diagnosis:  Fall , Ankle Pain , Left , Total Body Pain  Patient Active Problem List   Diagnosis Date Noted  . Gait abnormality 12/04/2019  . Hypokalemia   . Effusion of left knee joint   . Hyponatremia   . Left patella fracture 10/26/2018  . Acquired hypothyroidism 08/25/2017  . 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 Hammer toe 08/25/2017  . History of breast cancer 08/25/2017  . Nontoxic thyroid nodule 08/25/2017  . Osteopetrosis 08/25/2017  . Dysuria 01/19/2017  . Vitamin D deficiency 11/18/2016  . Tremor, essential 08/18/2016  . Convulsions/seizures ([place]) 10/16/2014  .  Brain tumor (benign) ([place]) 10/18/2013  . Subdural hemorrhage ([place]) 10/09/2013  . Anxiety 10/09/2013  . Compression fracture of T12 vertebra ([place]) 10/09/2013  . Ejection fraction   . Multiple thyroid nodules 09/05/2013  . Syncope 08/27/2013  . Carotid artery disease ([place]) 08/27/2013  . Abnormal thyroid ultrasound 08/27/2013  . Cancer of right breast ([place])   . Cancer of central portion of female breast ([place]) 12/30/2012  . Osteoporosis 03/06/2010  . ASTHMA 03/05/2009  . IRRITABLE BOWEL SYNDROME  diarrhea type 03/21/2008  . ANEMIA-NOS 12/16/2006  . GERD 12/16/2006  . Insulin dependent diabetes mellitus 10/29/2006  . Hyperlipidemia 10/29/2006  . Essential hypertension 10/29/2006  . Allergic rhinitis, cause  unspecified 10/29/2006  . OSTEOARTHRITIS 10/29/2006   PCP:  Bartholome Bill, MD Pharmacy:   [facility] [phone] [place], [place] [address] [place] [address] Phone: [phone] Fax: [phone]  [facility] [place], [place] [address] [place] [address] Phone: [phone] Fax: [phone]     Social Determinants of Health (SDOH) Interventions    Readmission Risk Interventions No flowsheet data found.   CSW will continue to follow for D/C needs.  Alphonse Guild. Takeisha Cianci  MSW, LCSW, LCAS, CCS Transitions of Care Clinical Social Worker Care Coordination Department Ph: [phone]

## 2020-01-01 NOTE — Progress Notes (Signed)
DUE TO COVID-19 ONLY ONE VISITOR IS ALLOWED TO COME WITH YOU AND STAY IN THE WAITING ROOM ONLY DURING PRE OP AND PROCEDURE DAY OF SURGERY. THE 1 VISITOR  [name] WITH YOU AFTER SURGERY IN YOUR PRIVATE ROOM DURING VISITING HOURS ONLY!  YOU NEED TO HAVE A COVID 19 TEST ON_9/11/2019 ______ @_1010am______ , THIS TEST MUST BE DONE BEFORE SURGERY,  COVID TESTING SITE [address]  [address], IT IS ON THE RIGHT GOING OUT [address] APPROXIMATELY  2 MINUTES PAST [employer] ON THE RIGHT. ONCE YOUR COVID TEST IS COMPLETED,  PLEASE BEGIN THE QUARANTINE INSTRUCTIONS AS OUTLINED IN YOUR HANDOUT.                [name]  01/01/2020   Your procedure is scheduled on: 01/11/2020    Report to [facility] Main  Entrance   Report to admitting at    0530am AM     Call this number if you have problems the morning of surgery [phone]           REMEMBER: FOLLOW ALL [place]. DRINK 2 PRESURGERY ENSURE DRINKS THE NIGHT BEFORE SURGERY AT 1000 PM AND 1 PRESURGERY DRINK THE DAY OF THE PROCEDURE 3 HOURS PRIOR TO SCHEDULED SURGERY.  NOTHING BY MOUTH EXCEPT CLEAR LIQUIDS UNTIL THREE HOURS PRIOR TO SCHEDULED SURGERY. PLEASE FINISH PRESURGERY 3RD  ENSURE  DRINK PER SURGEON ORDER 3 HOURS PRIOR TO SCHEDULED SURGERY TIME WHICH NEEDS TO BE COMPLETED AT ___0430am______.   Clear liquid diet on day of bowel prep.   CLEAR LIQUID DIET   Foods Allowed                                                                      Coffee and tea, regular and decaf                           Plain Jell-O any favor except red or purple                                         Fruit ices (not with fruit pulp)                                      Iced Popsicles                                     Carbonated beverages, regular and diet                                    Cranberry, grape and apple juices Sports drinks like  Gatorade Lightly seasoned clear broth or consume(fat free) Sugar, honey syrup  _____________________________________________________________________      BRUSH YOUR TEETH MORNING OF SURGERY AND RINSE YOUR MOUTH OUT, NO CHEWING GUM CANDY OR  MINTS.     Take these medicines the morning of surgery with A SIP OF WATER:  Lexapro, Dilantin, synthroid, eye drops as usual, Take 1/2 of pm dose of Lantus nite before surgery  DO NOT TAKE ANY DIABETIC MEDICATIONS DAY OF YOUR SURGERY                               You [place] not have any metal on your body including hair pins and              piercings  Do not wear jewelry, make-up, lotions, powders or perfumes, deodorant             Do not wear nail polish on your fingernails.  Do not shave  48 hours prior to surgery.              Men [place] shave face and neck.   Do not bring valuables to the hospital. [place].  Contacts, dentures or bridgework [place] not be worn into surgery.  Leave suitcase in the car. After surgery it [place] be brought to your room.                 Please read over the following fact sheets you were given: _____________________________________________________________________  [name] - Preparing for Surgery Before surgery, you can play an important role.  Because skin is not sterile, your skin needs to be as free of germs as possible.  You can reduce the number of germs on your skin by washing with CHG (chlorahexidine gluconate) soap before surgery.  CHG is an antiseptic cleaner which kills germs and bonds with the skin to continue killing germs even after washing. Please DO NOT use if you have an allergy to CHG or antibacterial soaps.  If your skin becomes reddened/irritated stop using the CHG and inform your nurse when you arrive at Short Stay. Do not shave (including legs and underarms) for at least 48 hours prior to the first CHG shower.  You [place]ters shave your face/neck. Please  follow these instructions carefully:  1.  Shower with CHG Soap the night before surgery and the  morning of Surgery.  2.  If you choose to wash your hair, wash your hair first as usual with your  normal  shampoo.  3.  After you shampoo, rinse your hair and body thoroughly to remove the  shampoo.                           4.  Use CHG as you would any other liquid soap.  You can apply chg directly  to the skin and wash                       Gently with a scrungie or clean washcloth.  5.  Apply the CHG Soap to your body ONLY FROM THE NECK DOWN.   Do not use on face/ open                           Wound or open sores. Avoid contact with eyes, ears mouth and genitals (private parts).  Wash face,  Genitals (private parts) with your normal soap.             6.  Wash thoroughly, paying special attention to the area where your surgery  will be performed.  7.  Thoroughly rinse your body with warm water from the neck down.  8.  DO NOT shower/wash with your normal soap after using and rinsing off  the CHG Soap.                9.  Pat yourself dry with a clean towel.            10.  Wear clean pajamas.            11.  Place clean sheets on your bed the night of your first shower and do not  sleep with pets. Day of Surgery : Do not apply any lotions/deodorants the morning of surgery.  Please wear clean clothes to the hospital/surgery center.  FAILURE TO FOLLOW THESE INSTRUCTIONS [place] RESULT IN THE CANCELLATION OF YOUR SURGERY PATIENT SIGNATURE_________________________________  NURSE SIGNATURE__________________________________  ________________________________________________________________________

## 2020-01-01 NOTE — Progress Notes (Signed)
CSW spoke to pt who stated she lived alone and could not ambulate, nor transition easily and had no one in the home who could care for her at night as her caregivers only worked linited hours during the day.  Pt stated she could not live alone in her current state and chooses SNF care and states it is her only option.  CSW spoke with pt and confirmed pt's plan to be discharged to SNF to for rehab at discharge.  CSW provided active listening and validated pt's concerns that she be given as many choices as possible but conceded she had been to a couple of them and did not like them as a rule.   Pt did commit to going however. CSW was given permission to complete FL-2 and send referrals out to SNF facilities via the hub, per pt's request.  Pt has been living with the help of two caregivers that had limited hours in which to help her during the day only prior to being admitted to [facility] ED.  CSW will continue to follow for D/C needs.  Alphonse Guild. Kidada Ging  MSW, LCSW, LCAS, CCS Transitions of Care Clinical Social Worker Care Coordination Department Ph: [phone]

## 2020-01-01 NOTE — Progress Notes (Signed)
Patient fell on 12/30/19.  IN ED.  Remains in ED at [facility].  Spoke with nurse in ED- Patient waiting on SNF Bed or Rehab bed.  Called CCS and spoke with [facility] and made aware.  She will inform Dr Dema Severin. Made Roanna Banning ( anesthesia) aware.  Informed [facility] Scheduler to call pt's family and inform them preop appt on 01/02/20 to be cancelled.

## 2020-01-02 ENCOUNTER — Inpatient Hospital Stay (HOSPITAL_COMMUNITY)
Admission: RE | Admit: 2020-01-02 | Discharge: 2020-01-02 | Disposition: A | Discharge status: Home / Self Care | Payer: Medicare Other | Source: Ambulatory Visit

## 2020-01-02 DIAGNOSIS — E039 Hypothyroidism, unspecified: Secondary | ICD-10-CM | POA: Diagnosis present

## 2020-01-02 DIAGNOSIS — S82892D Other fracture of left lower leg, subsequent encounter for closed fracture with routine healing: Secondary | ICD-10-CM | POA: Diagnosis not present

## 2020-01-02 DIAGNOSIS — Z79899 Other long term (current) drug therapy: Secondary | ICD-10-CM | POA: Diagnosis not present

## 2020-01-02 DIAGNOSIS — I313 Pericardial effusion (noninflammatory): Secondary | ICD-10-CM | POA: Diagnosis not present

## 2020-01-02 DIAGNOSIS — E876 Hypokalemia: Secondary | ICD-10-CM | POA: Diagnosis not present

## 2020-01-02 DIAGNOSIS — E861 Hypovolemia: Secondary | ICD-10-CM | POA: Diagnosis not present

## 2020-01-02 DIAGNOSIS — R918 Other nonspecific abnormal finding of lung field: Secondary | ICD-10-CM | POA: Diagnosis not present

## 2020-01-02 DIAGNOSIS — R5381 Other malaise: Secondary | ICD-10-CM | POA: Diagnosis not present

## 2020-01-02 DIAGNOSIS — F411 Generalized anxiety disorder: Secondary | ICD-10-CM | POA: Diagnosis present

## 2020-01-02 DIAGNOSIS — E785 Hyperlipidemia, unspecified: Secondary | ICD-10-CM | POA: Diagnosis present

## 2020-01-02 DIAGNOSIS — M4854XA Collapsed vertebra, not elsewhere classified, thoracic region, initial encounter for fracture: Secondary | ICD-10-CM | POA: Diagnosis not present

## 2020-01-02 DIAGNOSIS — K567 Ileus, unspecified: Secondary | ICD-10-CM | POA: Diagnosis not present

## 2020-01-02 DIAGNOSIS — S82202D Unspecified fracture of shaft of left tibia, subsequent encounter for closed fracture with routine healing: Secondary | ICD-10-CM | POA: Diagnosis not present

## 2020-01-02 DIAGNOSIS — I700 Atherosclerosis of aorta: Secondary | ICD-10-CM | POA: Diagnosis not present

## 2020-01-02 DIAGNOSIS — G8929 Other chronic pain: Secondary | ICD-10-CM | POA: Diagnosis present

## 2020-01-02 DIAGNOSIS — M19022 Primary osteoarthritis, left elbow: Secondary | ICD-10-CM | POA: Diagnosis not present

## 2020-01-02 DIAGNOSIS — R339 Retention of urine, unspecified: Secondary | ICD-10-CM | POA: Diagnosis present

## 2020-01-02 DIAGNOSIS — Z85038 Personal history of other malignant neoplasm of large intestine: Secondary | ICD-10-CM | POA: Diagnosis not present

## 2020-01-02 DIAGNOSIS — R911 Solitary pulmonary nodule: Secondary | ICD-10-CM | POA: Diagnosis not present

## 2020-01-02 DIAGNOSIS — M1612 Unilateral primary osteoarthritis, left hip: Secondary | ICD-10-CM | POA: Diagnosis not present

## 2020-01-02 DIAGNOSIS — D519 Vitamin B12 deficiency anemia, unspecified: Secondary | ICD-10-CM | POA: Diagnosis present

## 2020-01-02 DIAGNOSIS — C50911 Malignant neoplasm of unspecified site of right female breast: Secondary | ICD-10-CM | POA: Diagnosis not present

## 2020-01-02 DIAGNOSIS — D649 Anemia, unspecified: Secondary | ICD-10-CM | POA: Diagnosis present

## 2020-01-02 DIAGNOSIS — E1165 Type 2 diabetes mellitus with hyperglycemia: Secondary | ICD-10-CM | POA: Diagnosis present

## 2020-01-02 DIAGNOSIS — C4490 Unspecified malignant neoplasm of skin, unspecified: Secondary | ICD-10-CM | POA: Diagnosis not present

## 2020-01-02 DIAGNOSIS — R52 Pain, unspecified: Secondary | ICD-10-CM | POA: Diagnosis not present

## 2020-01-02 DIAGNOSIS — G250 Essential tremor: Secondary | ICD-10-CM | POA: Diagnosis present

## 2020-01-02 DIAGNOSIS — Z20822 Contact with and (suspected) exposure to covid-19: Secondary | ICD-10-CM | POA: Diagnosis present

## 2020-01-02 DIAGNOSIS — I2510 Atherosclerotic heart disease of native coronary artery without angina pectoris: Secondary | ICD-10-CM | POA: Diagnosis not present

## 2020-01-02 DIAGNOSIS — K219 Gastro-esophageal reflux disease without esophagitis: Secondary | ICD-10-CM | POA: Diagnosis present

## 2020-01-02 DIAGNOSIS — E119 Type 2 diabetes mellitus without complications: Secondary | ICD-10-CM | POA: Diagnosis not present

## 2020-01-02 DIAGNOSIS — W1830XD Fall on same level, unspecified, subsequent encounter: Secondary | ICD-10-CM | POA: Diagnosis not present

## 2020-01-02 DIAGNOSIS — M810 Age-related osteoporosis without current pathological fracture: Secondary | ICD-10-CM | POA: Diagnosis present

## 2020-01-02 DIAGNOSIS — M6281 Muscle weakness (generalized): Secondary | ICD-10-CM | POA: Diagnosis present

## 2020-01-02 DIAGNOSIS — J984 Other disorders of lung: Secondary | ICD-10-CM | POA: Diagnosis not present

## 2020-01-02 DIAGNOSIS — K661 Hemoperitoneum: Secondary | ICD-10-CM | POA: Diagnosis not present

## 2020-01-02 DIAGNOSIS — C182 Malignant neoplasm of ascending colon: Secondary | ICD-10-CM | POA: Diagnosis present

## 2020-01-02 DIAGNOSIS — S91101A Unspecified open wound of right great toe without damage to nail, initial encounter: Secondary | ICD-10-CM | POA: Diagnosis not present

## 2020-01-02 DIAGNOSIS — Y838 Other surgical procedures as the cause of abnormal reaction of the patient, or of later complication, without mention of misadventure at the time of the procedure: Secondary | ICD-10-CM | POA: Diagnosis not present

## 2020-01-02 DIAGNOSIS — R269 Unspecified abnormalities of gait and mobility: Secondary | ICD-10-CM | POA: Diagnosis present

## 2020-01-02 DIAGNOSIS — R262 Difficulty in walking, not elsewhere classified: Secondary | ICD-10-CM | POA: Diagnosis present

## 2020-01-02 DIAGNOSIS — R531 Weakness: Secondary | ICD-10-CM | POA: Diagnosis not present

## 2020-01-02 DIAGNOSIS — S82231D Displaced oblique fracture of shaft of right tibia, subsequent encounter for closed fracture with routine healing: Secondary | ICD-10-CM | POA: Diagnosis not present

## 2020-01-02 DIAGNOSIS — E782 Mixed hyperlipidemia: Secondary | ICD-10-CM | POA: Diagnosis present

## 2020-01-02 DIAGNOSIS — Z4689 Encounter for fitting and adjustment of other specified devices: Secondary | ICD-10-CM | POA: Diagnosis not present

## 2020-01-02 DIAGNOSIS — E871 Hypo-osmolality and hyponatremia: Secondary | ICD-10-CM | POA: Diagnosis present

## 2020-01-02 DIAGNOSIS — Z743 Need for continuous supervision: Secondary | ICD-10-CM | POA: Diagnosis not present

## 2020-01-02 DIAGNOSIS — M533 Sacrococcygeal disorders, not elsewhere classified: Secondary | ICD-10-CM | POA: Diagnosis not present

## 2020-01-02 DIAGNOSIS — R2681 Unsteadiness on feet: Secondary | ICD-10-CM | POA: Diagnosis present

## 2020-01-02 DIAGNOSIS — I10 Essential (primary) hypertension: Secondary | ICD-10-CM | POA: Diagnosis present

## 2020-01-02 DIAGNOSIS — H1031 Unspecified acute conjunctivitis, right eye: Secondary | ICD-10-CM | POA: Diagnosis not present

## 2020-01-02 DIAGNOSIS — S82302A Unspecified fracture of lower end of left tibia, initial encounter for closed fracture: Secondary | ICD-10-CM | POA: Diagnosis not present

## 2020-01-02 DIAGNOSIS — I959 Hypotension, unspecified: Secondary | ICD-10-CM | POA: Diagnosis not present

## 2020-01-02 DIAGNOSIS — D62 Acute posthemorrhagic anemia: Secondary | ICD-10-CM | POA: Diagnosis not present

## 2020-01-02 DIAGNOSIS — S82245A Nondisplaced spiral fracture of shaft of left tibia, initial encounter for closed fracture: Secondary | ICD-10-CM | POA: Diagnosis not present

## 2020-01-02 DIAGNOSIS — Z853 Personal history of malignant neoplasm of breast: Secondary | ICD-10-CM | POA: Diagnosis not present

## 2020-01-02 DIAGNOSIS — E118 Type 2 diabetes mellitus with unspecified complications: Secondary | ICD-10-CM | POA: Diagnosis present

## 2020-01-02 DIAGNOSIS — R279 Unspecified lack of coordination: Secondary | ICD-10-CM | POA: Diagnosis not present

## 2020-01-02 DIAGNOSIS — S82309D Unspecified fracture of lower end of unspecified tibia, subsequent encounter for closed fracture with routine healing: Secondary | ICD-10-CM | POA: Diagnosis not present

## 2020-01-02 DIAGNOSIS — E041 Nontoxic single thyroid nodule: Secondary | ICD-10-CM | POA: Diagnosis not present

## 2020-01-02 DIAGNOSIS — C189 Malignant neoplasm of colon, unspecified: Secondary | ICD-10-CM | POA: Diagnosis present

## 2020-01-02 DIAGNOSIS — R059 Cough, unspecified: Secondary | ICD-10-CM | POA: Diagnosis not present

## 2020-01-02 DIAGNOSIS — R296 Repeated falls: Secondary | ICD-10-CM | POA: Diagnosis present

## 2020-01-02 DIAGNOSIS — M25572 Pain in left ankle and joints of left foot: Secondary | ICD-10-CM | POA: Diagnosis not present

## 2020-01-02 DIAGNOSIS — R112 Nausea with vomiting, unspecified: Secondary | ICD-10-CM | POA: Diagnosis not present

## 2020-01-02 DIAGNOSIS — R569 Unspecified convulsions: Secondary | ICD-10-CM | POA: Diagnosis not present

## 2020-01-02 DIAGNOSIS — J9811 Atelectasis: Secondary | ICD-10-CM | POA: Diagnosis not present

## 2020-01-02 DIAGNOSIS — R600 Localized edema: Secondary | ICD-10-CM | POA: Diagnosis not present

## 2020-01-02 DIAGNOSIS — K148 Other diseases of tongue: Secondary | ICD-10-CM | POA: Diagnosis not present

## 2020-01-02 DIAGNOSIS — Z794 Long term (current) use of insulin: Secondary | ICD-10-CM | POA: Diagnosis not present

## 2020-01-02 DIAGNOSIS — J45909 Unspecified asthma, uncomplicated: Secondary | ICD-10-CM | POA: Diagnosis not present

## 2020-01-02 DIAGNOSIS — T888XXA Other specified complications of surgical and medical care, not elsewhere classified, initial encounter: Secondary | ICD-10-CM | POA: Diagnosis not present

## 2020-01-02 DIAGNOSIS — Z23 Encounter for immunization: Secondary | ICD-10-CM | POA: Diagnosis not present

## 2020-01-02 LAB — CBC
HCT: 23.2 % — ABNORMAL LOW (ref 36.0–46.0)
Hemoglobin: 7.6 g/dL — ABNORMAL LOW (ref 12.0–15.0)
MCH: 27.5 pg (ref 26.0–34.0)
MCHC: 32.8 g/dL (ref 30.0–36.0)
MCV: 84.1 fL (ref 80.0–100.0)
Platelets: 263 10*3/uL (ref 150–400)
RBC: 2.76 MIL/uL — ABNORMAL LOW (ref 3.87–5.11)
RDW: 14 % (ref 11.5–15.5)
WBC: 8.3 10*3/uL (ref 4.0–10.5)
nRBC: 0 % (ref 0.0–0.2)

## 2020-01-02 LAB — COMPREHENSIVE METABOLIC PANEL
ALT: 15 U/L (ref 0–44)
AST: 13 U/L — ABNORMAL LOW (ref 15–41)
Albumin: 2.6 g/dL — ABNORMAL LOW (ref 3.5–5.0)
Alkaline Phosphatase: 58 U/L (ref 38–126)
Anion gap: 7 (ref 5–15)
BUN: 10 mg/dL (ref 8–23)
CO2: 22 mmol/L (ref 22–32)
Calcium: 7.7 mg/dL — ABNORMAL LOW (ref 8.9–10.3)
Chloride: 93 mmol/L — ABNORMAL LOW (ref 98–111)
Creatinine, Ser: 0.63 mg/dL (ref 0.44–1.00)
GFR calc Af Amer: >60 mL/min (ref >60)
GFR calc non Af Amer: >60 mL/min (ref >60)
Glucose, Bld: 177 mg/dL — ABNORMAL HIGH (ref 70–99)
Potassium: 4.3 mmol/L (ref 3.5–5.1)
Sodium: 122 mmol/L — ABNORMAL LOW (ref 135–145)
Total Bilirubin: 0.7 mg/dL (ref 0.3–1.2)
Total Protein: 5.1 g/dL — ABNORMAL LOW (ref 6.5–8.1)

## 2020-01-02 LAB — CBG MONITORING, ED: Glucose-Capillary: 206 mg/dL — ABNORMAL HIGH (ref 70–99)

## 2020-01-02 MED ORDER — TRAMADOL HCL 50 MG PO TABS: 50.0000 mg | ORAL_TABLET | Freq: Four times a day (QID) | ORAL | 0 refills | Status: DC | PRN

## 2020-01-02 NOTE — ED Notes (Signed)
[facility] called for transport. RN has attempted to call report to [facility] x3

## 2020-01-02 NOTE — TOC Progression Note (Signed)
Transition of Care [facility]) - Progression Note    Patient Details  Name: [name] MRN: [id number] Date of Birth: 06-08-1929  Transition of Care [facility]) CM/SW Contact  Cecila Satcher C Tarpley-Carter, LCSWA Phone Number: 01/02/2020, 9:50 AM  Clinical Narrative:    TOC CSW has spoken with Manning Charity niece, [phone] and Tonia Ghent Smith/pts sister, [phone].  When CSW presented them with the Medicare.gov information they stated they were unfamiliar with the different homes.  But they were all in with [facility]  [facility].    CSW spoke with Tammy Blakley/Admissions, [phone].  Tammy has accepted pt and provided information for transfer of care.  Room #:  164  Call Report #:  [phone]     Expected Discharge Plan: [place] Barriers to Discharge: Insurance Authorization complete-waiver  Expected Discharge Plan and Services Expected Discharge Plan: [place] arrangements for the past 2 months: Single Family Home                                       Social Determinants of Health (SDOH) Interventions    Readmission Risk Interventions No flowsheet data found.

## 2020-01-02 NOTE — ED Provider Notes (Signed)
Emergency Medicine Observation Re-evaluation Note  [name] is a 86 y.o. female, seen on rounds today.  Pt initially presented to the ED for complaints of Ankle Pain and Fall Currently, the patient is boarding awaiting SNF placement after fall .  Physical Exam  BP 112/76   Pulse 90   Temp 99 F (37.2 C) (Oral)   Resp 17   Ht 5' 7.5" (1.715 m)   Wt 61.2 kg   SpO2 92%   BMI 20.83 kg/m  Physical Exam General: No acute distress Cardiac: Good perfusion Lungs: Even unlabored respirations Psych: Calm and cooperative.   ED Course / MDM  EKG:    I have reviewed the labs performed to date as well as medications administered while in observation.  Recent changes in the last 24 hours include patient has been accepted to nursing facility, [facility].  She will require hard copies of new prescription medications with orthopedic follow-up which is already in place.   Plan  Current plan is for discharge to SNF.    Margette Fast, MD 01/03/20 [phone]

## 2020-01-05 DIAGNOSIS — Z853 Personal history of malignant neoplasm of breast: Secondary | ICD-10-CM | POA: Diagnosis not present

## 2020-01-05 DIAGNOSIS — D649 Anemia, unspecified: Secondary | ICD-10-CM | POA: Diagnosis not present

## 2020-01-05 DIAGNOSIS — C189 Malignant neoplasm of colon, unspecified: Secondary | ICD-10-CM | POA: Diagnosis not present

## 2020-01-05 DIAGNOSIS — D519 Vitamin B12 deficiency anemia, unspecified: Secondary | ICD-10-CM | POA: Diagnosis not present

## 2020-01-05 DIAGNOSIS — I10 Essential (primary) hypertension: Secondary | ICD-10-CM | POA: Diagnosis not present

## 2020-01-05 DIAGNOSIS — S82892D Other fracture of left lower leg, subsequent encounter for closed fracture with routine healing: Secondary | ICD-10-CM | POA: Diagnosis not present

## 2020-01-05 DIAGNOSIS — E785 Hyperlipidemia, unspecified: Secondary | ICD-10-CM | POA: Diagnosis not present

## 2020-01-08 DIAGNOSIS — D649 Anemia, unspecified: Secondary | ICD-10-CM | POA: Diagnosis not present

## 2020-01-08 DIAGNOSIS — S82892D Other fracture of left lower leg, subsequent encounter for closed fracture with routine healing: Secondary | ICD-10-CM | POA: Diagnosis not present

## 2020-01-08 DIAGNOSIS — D519 Vitamin B12 deficiency anemia, unspecified: Secondary | ICD-10-CM | POA: Diagnosis not present

## 2020-01-08 DIAGNOSIS — E118 Type 2 diabetes mellitus with unspecified complications: Secondary | ICD-10-CM | POA: Diagnosis not present

## 2020-01-08 DIAGNOSIS — E871 Hypo-osmolality and hyponatremia: Secondary | ICD-10-CM | POA: Diagnosis not present

## 2020-01-08 DIAGNOSIS — F411 Generalized anxiety disorder: Secondary | ICD-10-CM | POA: Diagnosis not present

## 2020-01-08 DIAGNOSIS — I10 Essential (primary) hypertension: Secondary | ICD-10-CM | POA: Diagnosis not present

## 2020-01-08 DIAGNOSIS — E785 Hyperlipidemia, unspecified: Secondary | ICD-10-CM | POA: Diagnosis not present

## 2020-01-09 ENCOUNTER — Other Ambulatory Visit (HOSPITAL_COMMUNITY): Payer: Medicare Other

## 2020-01-12 DIAGNOSIS — R569 Unspecified convulsions: Secondary | ICD-10-CM | POA: Diagnosis not present

## 2020-01-12 DIAGNOSIS — E118 Type 2 diabetes mellitus with unspecified complications: Secondary | ICD-10-CM | POA: Diagnosis not present

## 2020-01-12 DIAGNOSIS — E785 Hyperlipidemia, unspecified: Secondary | ICD-10-CM | POA: Diagnosis not present

## 2020-01-12 DIAGNOSIS — I10 Essential (primary) hypertension: Secondary | ICD-10-CM | POA: Diagnosis not present

## 2020-01-12 DIAGNOSIS — E871 Hypo-osmolality and hyponatremia: Secondary | ICD-10-CM | POA: Diagnosis not present

## 2020-01-12 DIAGNOSIS — K219 Gastro-esophageal reflux disease without esophagitis: Secondary | ICD-10-CM | POA: Diagnosis not present

## 2020-01-12 DIAGNOSIS — F411 Generalized anxiety disorder: Secondary | ICD-10-CM | POA: Diagnosis not present

## 2020-01-17 DIAGNOSIS — S82245A Nondisplaced spiral fracture of shaft of left tibia, initial encounter for closed fracture: Secondary | ICD-10-CM | POA: Diagnosis not present

## 2020-01-17 DIAGNOSIS — M25572 Pain in left ankle and joints of left foot: Secondary | ICD-10-CM | POA: Diagnosis not present

## 2020-01-19 DIAGNOSIS — Z853 Personal history of malignant neoplasm of breast: Secondary | ICD-10-CM | POA: Diagnosis not present

## 2020-01-19 DIAGNOSIS — C189 Malignant neoplasm of colon, unspecified: Secondary | ICD-10-CM | POA: Diagnosis not present

## 2020-01-19 DIAGNOSIS — E785 Hyperlipidemia, unspecified: Secondary | ICD-10-CM | POA: Diagnosis not present

## 2020-01-19 DIAGNOSIS — D649 Anemia, unspecified: Secondary | ICD-10-CM | POA: Diagnosis not present

## 2020-01-19 DIAGNOSIS — Z79899 Other long term (current) drug therapy: Secondary | ICD-10-CM | POA: Diagnosis not present

## 2020-01-19 DIAGNOSIS — I10 Essential (primary) hypertension: Secondary | ICD-10-CM | POA: Diagnosis not present

## 2020-01-19 DIAGNOSIS — S82892D Other fracture of left lower leg, subsequent encounter for closed fracture with routine healing: Secondary | ICD-10-CM | POA: Diagnosis not present

## 2020-01-19 DIAGNOSIS — R52 Pain, unspecified: Secondary | ICD-10-CM | POA: Diagnosis not present

## 2020-01-26 DIAGNOSIS — E785 Hyperlipidemia, unspecified: Secondary | ICD-10-CM | POA: Diagnosis not present

## 2020-01-26 DIAGNOSIS — S91101A Unspecified open wound of right great toe without damage to nail, initial encounter: Secondary | ICD-10-CM | POA: Diagnosis not present

## 2020-01-26 DIAGNOSIS — D519 Vitamin B12 deficiency anemia, unspecified: Secondary | ICD-10-CM | POA: Diagnosis not present

## 2020-01-26 DIAGNOSIS — D649 Anemia, unspecified: Secondary | ICD-10-CM | POA: Diagnosis not present

## 2020-01-26 DIAGNOSIS — I10 Essential (primary) hypertension: Secondary | ICD-10-CM | POA: Diagnosis not present

## 2020-01-26 DIAGNOSIS — F411 Generalized anxiety disorder: Secondary | ICD-10-CM | POA: Diagnosis not present

## 2020-01-26 DIAGNOSIS — S82892D Other fracture of left lower leg, subsequent encounter for closed fracture with routine healing: Secondary | ICD-10-CM | POA: Diagnosis not present

## 2020-02-01 DIAGNOSIS — Z4689 Encounter for fitting and adjustment of other specified devices: Secondary | ICD-10-CM | POA: Diagnosis not present

## 2020-02-02 DIAGNOSIS — S82892D Other fracture of left lower leg, subsequent encounter for closed fracture with routine healing: Secondary | ICD-10-CM | POA: Diagnosis not present

## 2020-02-02 DIAGNOSIS — K148 Other diseases of tongue: Secondary | ICD-10-CM | POA: Diagnosis not present

## 2020-02-07 DIAGNOSIS — R918 Other nonspecific abnormal finding of lung field: Secondary | ICD-10-CM | POA: Diagnosis not present

## 2020-02-12 DIAGNOSIS — E785 Hyperlipidemia, unspecified: Secondary | ICD-10-CM | POA: Diagnosis not present

## 2020-02-12 DIAGNOSIS — J984 Other disorders of lung: Secondary | ICD-10-CM | POA: Diagnosis not present

## 2020-02-12 DIAGNOSIS — D649 Anemia, unspecified: Secondary | ICD-10-CM | POA: Diagnosis not present

## 2020-02-12 DIAGNOSIS — C189 Malignant neoplasm of colon, unspecified: Secondary | ICD-10-CM | POA: Diagnosis not present

## 2020-02-12 DIAGNOSIS — S82202D Unspecified fracture of shaft of left tibia, subsequent encounter for closed fracture with routine healing: Secondary | ICD-10-CM | POA: Diagnosis not present

## 2020-02-12 DIAGNOSIS — S82209A Unspecified fracture of shaft of unspecified tibia, initial encounter for closed fracture: Secondary | ICD-10-CM | POA: Insufficient documentation

## 2020-02-12 DIAGNOSIS — M25572 Pain in left ankle and joints of left foot: Secondary | ICD-10-CM | POA: Diagnosis not present

## 2020-02-12 DIAGNOSIS — Z79899 Other long term (current) drug therapy: Secondary | ICD-10-CM | POA: Diagnosis not present

## 2020-02-12 DIAGNOSIS — D519 Vitamin B12 deficiency anemia, unspecified: Secondary | ICD-10-CM | POA: Diagnosis not present

## 2020-02-12 DIAGNOSIS — S82892D Other fracture of left lower leg, subsequent encounter for closed fracture with routine healing: Secondary | ICD-10-CM | POA: Diagnosis not present

## 2020-02-12 DIAGNOSIS — E118 Type 2 diabetes mellitus with unspecified complications: Secondary | ICD-10-CM | POA: Diagnosis not present

## 2020-02-12 DIAGNOSIS — I10 Essential (primary) hypertension: Secondary | ICD-10-CM | POA: Diagnosis not present

## 2020-02-14 DIAGNOSIS — C189 Malignant neoplasm of colon, unspecified: Secondary | ICD-10-CM | POA: Diagnosis not present

## 2020-02-17 ENCOUNTER — Encounter (HOSPITAL_COMMUNITY): Payer: Self-pay | Admitting: Emergency Medicine

## 2020-02-17 ENCOUNTER — Emergency Department (HOSPITAL_COMMUNITY): Payer: Medicare Other

## 2020-02-17 ENCOUNTER — Emergency Department (HOSPITAL_COMMUNITY)
Admission: EM | Admit: 2020-02-17 | Discharge: 2020-02-18 | Disposition: A | Discharge status: Home / Self Care | Payer: Medicare Other | Attending: Emergency Medicine | Admitting: Emergency Medicine

## 2020-02-17 ENCOUNTER — Other Ambulatory Visit: Payer: Self-pay

## 2020-02-17 DIAGNOSIS — I700 Atherosclerosis of aorta: Secondary | ICD-10-CM | POA: Diagnosis not present

## 2020-02-17 DIAGNOSIS — I2510 Atherosclerotic heart disease of native coronary artery without angina pectoris: Secondary | ICD-10-CM | POA: Diagnosis not present

## 2020-02-17 DIAGNOSIS — R531 Weakness: Secondary | ICD-10-CM | POA: Diagnosis not present

## 2020-02-17 DIAGNOSIS — Z79899 Other long term (current) drug therapy: Secondary | ICD-10-CM | POA: Insufficient documentation

## 2020-02-17 DIAGNOSIS — R5381 Other malaise: Secondary | ICD-10-CM | POA: Diagnosis not present

## 2020-02-17 DIAGNOSIS — E119 Type 2 diabetes mellitus without complications: Secondary | ICD-10-CM | POA: Insufficient documentation

## 2020-02-17 DIAGNOSIS — E039 Hypothyroidism, unspecified: Secondary | ICD-10-CM | POA: Diagnosis not present

## 2020-02-17 DIAGNOSIS — I313 Pericardial effusion (noninflammatory): Secondary | ICD-10-CM | POA: Diagnosis not present

## 2020-02-17 DIAGNOSIS — R911 Solitary pulmonary nodule: Secondary | ICD-10-CM | POA: Diagnosis not present

## 2020-02-17 DIAGNOSIS — Z85038 Personal history of other malignant neoplasm of large intestine: Secondary | ICD-10-CM | POA: Insufficient documentation

## 2020-02-17 DIAGNOSIS — I10 Essential (primary) hypertension: Secondary | ICD-10-CM | POA: Diagnosis not present

## 2020-02-17 DIAGNOSIS — J9811 Atelectasis: Secondary | ICD-10-CM | POA: Diagnosis not present

## 2020-02-17 DIAGNOSIS — R059 Cough, unspecified: Secondary | ICD-10-CM | POA: Diagnosis not present

## 2020-02-17 DIAGNOSIS — R918 Other nonspecific abnormal finding of lung field: Secondary | ICD-10-CM | POA: Diagnosis present

## 2020-02-17 DIAGNOSIS — Z853 Personal history of malignant neoplasm of breast: Secondary | ICD-10-CM | POA: Diagnosis not present

## 2020-02-17 DIAGNOSIS — J45909 Unspecified asthma, uncomplicated: Secondary | ICD-10-CM | POA: Diagnosis not present

## 2020-02-17 DIAGNOSIS — Z794 Long term (current) use of insulin: Secondary | ICD-10-CM | POA: Diagnosis not present

## 2020-02-17 DIAGNOSIS — M4854XA Collapsed vertebra, not elsewhere classified, thoracic region, initial encounter for fracture: Secondary | ICD-10-CM | POA: Diagnosis not present

## 2020-02-17 LAB — CBC WITH DIFFERENTIAL/PLATELET
Abs Immature Granulocytes: 0.06 10*3/uL (ref 0.00–0.07)
Basophils Absolute: 0.1 10*3/uL (ref 0.0–0.1)
Basophils Relative: 1 %
Eosinophils Absolute: 0.1 10*3/uL (ref 0.0–0.5)
Eosinophils Relative: 1 %
HCT: 28.6 % — ABNORMAL LOW (ref 36.0–46.0)
Hemoglobin: 8.6 g/dL — ABNORMAL LOW (ref 12.0–15.0)
Immature Granulocytes: 1 %
Lymphocytes Relative: 17 %
Lymphs Abs: 1.7 10*3/uL (ref 0.7–4.0)
MCH: 22.8 pg — ABNORMAL LOW (ref 26.0–34.0)
MCHC: 30.1 g/dL (ref 30.0–36.0)
MCV: 75.9 fL — ABNORMAL LOW (ref 80.0–100.0)
Monocytes Absolute: 0.8 10*3/uL (ref 0.1–1.0)
Monocytes Relative: 8 %
Neutro Abs: 7.3 10*3/uL (ref 1.7–7.7)
Neutrophils Relative %: 72 %
Platelets: 437 10*3/uL — ABNORMAL HIGH (ref 150–400)
RBC: 3.77 MIL/uL — ABNORMAL LOW (ref 3.87–5.11)
RDW: 15.9 % — ABNORMAL HIGH (ref 11.5–15.5)
WBC: 10.1 10*3/uL (ref 4.0–10.5)
nRBC: 0 % (ref 0.0–0.2)

## 2020-02-17 LAB — COMPREHENSIVE METABOLIC PANEL
ALT: 19 U/L (ref 0–44)
AST: 20 U/L (ref 15–41)
Albumin: 3.1 g/dL — ABNORMAL LOW (ref 3.5–5.0)
Alkaline Phosphatase: 95 U/L (ref 38–126)
Anion gap: 8 (ref 5–15)
BUN: 8 mg/dL (ref 8–23)
CO2: 23 mmol/L (ref 22–32)
Calcium: 8.6 mg/dL — ABNORMAL LOW (ref 8.9–10.3)
Chloride: 99 mmol/L (ref 98–111)
Creatinine, Ser: 0.71 mg/dL (ref 0.44–1.00)
GFR, Estimated: >60 mL/min (ref >60)
Glucose, Bld: 205 mg/dL — ABNORMAL HIGH (ref 70–99)
Potassium: 4.6 mmol/L (ref 3.5–5.1)
Sodium: 130 mmol/L — ABNORMAL LOW (ref 135–145)
Total Bilirubin: 0.6 mg/dL (ref 0.3–1.2)
Total Protein: 5.9 g/dL — ABNORMAL LOW (ref 6.5–8.1)

## 2020-02-17 NOTE — ED Notes (Signed)
Patient transported to X-ray 

## 2020-02-17 NOTE — ED Provider Notes (Signed)
[facility] EMERGENCY DEPARTMENT Provider Note   CSN: [id number] Arrival date & time: 02/17/20  1241     History Chief Complaint  Patient presents with  . lung mass    [name] is a 86 y.o. female.  HPI 86 year old female presents with concern for lung mass.  She has had 2 x-rays over the last 10 days or so that have both shown right hilar mass.  She states she has had a cough for a month or so.  She denies shortness of breath.  She states she is having outpatient work-up for anemia due to blood in her stools and colonic mass that is due for surgery.  Otherwise she has no acute complaints.   Past Medical History:  Diagnosis Date  . Anemia   . Anxiety   . Breast cancer ([place]) 2014   right breast  . Cancer of right breast ([place]) 12/26/12   right breast 12:00 o'clock, DCIS  . Chronic cough   . Chronic facial pain   . Chronic foot pain   . Colon cancer ([place]) dx'd 11/2019  . Complication of anesthesia    Sore jaw; could not chew or move mouth  . Convulsions/seizures ([place]) 10/16/2014  . Diabetes mellitus    type 2 niddm x 20 years  . Dyslipidemia   . Ejection fraction   . Gait abnormality 12/04/2019  . GERD (gastroesophageal reflux disease)   . Hammer toe    bilateral  . History of colonic polyps   . HTN (hypertension)   . Hx of radiation therapy 03/07/13- 03/29/13   right breast 4250 cGy 17 sessions  . Hyperlipidemia   . Melanoma ([place])   . Metatarsal bone fracture 2014  . Multiple drug allergies   . Obesity   . Osteoarthritis   . Palpitations   . Personal history of radiation therapy 2014  . Seasonal allergies   . Skin cancer   . Tremor, essential 08/18/2016  . Vitamin B12 deficiency     Patient Active Problem List   Diagnosis Date Noted  . Gait abnormality 12/04/2019  . Hypokalemia   . Effusion of left knee joint   . Hyponatremia   . Left patella fracture 10/26/2018  . Acquired hypothyroidism 08/25/2017  . H/O excision of tumor of brain  meninges 08/25/2017  . Hammer toe 08/25/2017  . History of breast cancer 08/25/2017  . Nontoxic thyroid nodule 08/25/2017  . Osteopetrosis 08/25/2017  . Dysuria 01/19/2017  . Vitamin D deficiency 11/18/2016  . Tremor, essential 08/18/2016  . Convulsions/seizures ([place]) 10/16/2014  . Brain tumor (benign) ([place]) 10/18/2013  . Subdural hemorrhage ([place]) 10/09/2013  . Anxiety 10/09/2013  . Compression fracture of T12 vertebra ([place]) 10/09/2013  . Ejection fraction   . Multiple thyroid nodules 09/05/2013  . Syncope 08/27/2013  . Carotid artery disease ([place]) 08/27/2013  . Abnormal thyroid ultrasound 08/27/2013  . Cancer of right breast ([place])   . Cancer of central portion of female breast ([place]) 12/30/2012  . Osteoporosis 03/06/2010  . ASTHMA 03/05/2009  . IRRITABLE BOWEL SYNDROME  diarrhea type 03/21/2008  . ANEMIA-NOS 12/16/2006  . GERD 12/16/2006  . Insulin dependent diabetes mellitus 10/29/2006  . Hyperlipidemia 10/29/2006  . Essential hypertension 10/29/2006  . Allergic rhinitis, cause unspecified 10/29/2006  . OSTEOARTHRITIS 10/29/2006    Past Surgical History:  Procedure Laterality Date  . ABDOMINAL HYSTERECTOMY    . BIOPSY  11/07/2019   Procedure: BIOPSY;  Surgeon: Ronald Lobo, MD;  Location: [facility];  Service: Endoscopy;;  . BRAIN SURGERY    . BREAST BIOPSY Right 01/24/2013   Procedure: RE-EXCICION OF BREAST CANCER, ANTERIOR MARGINS;  Surgeon: Edward Jolly, MD;  Location: [facility];  Service: General;  Laterality: Right;  . BREAST LUMPECTOMY Right 2014  . BREAST LUMPECTOMY WITH NEEDLE LOCALIZATION Right 01/17/2013   Procedure: BREAST LUMPECTOMY WITH NEEDLE LOCALIZATION;  Surgeon: Edward Jolly, MD;  Location: [place];  Service: General;  Laterality: Right;  . BUNIONECTOMY    . CATARACT EXTRACTION W/ INTRAOCULAR LENS IMPLANT Right   . CHOLECYSTECTOMY    . COLONOSCOPY    . COLONOSCOPY WITH PROPOFOL N/A 11/07/2019   Procedure: COLONOSCOPY WITH PROPOFOL;  Surgeon:  Ronald Lobo, MD;  Location: [facility];  Service: Endoscopy;  Laterality: N/A;  . CRANIOTOMY Right 10/18/2013   Procedure: CRANIOTOMY TUMOR EXCISION;  Surgeon: Floyce Stakes, MD;  Location: [facility];  Service: Neurosurgery;  Laterality: Right;  . ESOPHAGOGASTRODUODENOSCOPY (EGD) WITH PROPOFOL N/A 11/07/2019   Procedure: ESOPHAGOGASTRODUODENOSCOPY (EGD) WITH PROPOFOL;  Surgeon: Ronald Lobo, MD;  Location: [facility];  Service: Endoscopy;  Laterality: N/A;  . EYE SURGERY    . HERNIA REPAIR    . KNEE ARTHROSCOPY Bilateral   . POLYPECTOMY     small adenomatous  . ULNAR TUNNEL RELEASE       OB History   No obstetric history on file.     Family History  Problem Relation Age of Onset  . Heart disease Mother   . Osteoporosis Mother   . Diabetes Father   . Pancreatic cancer Father   . Colon cancer Other   . Bone cancer Sister   . Prostate cancer Brother   . Colon cancer Brother   . Rectal cancer Sister   . Thyroid disease Sister        benign goiter resected    Social History   Tobacco Use  . Smoking status: Never Smoker  . Smokeless tobacco: Never Used  Vaping Use  . Vaping Use: Never used  Substance Use Topics  . Alcohol use: No  . Drug use: No    Home Medications Prior to Admission medications   Medication Sig Start Date End Date Taking? Authorizing Provider  ALPRAZolam (XANAX) 0.25 MG tablet TAKE 1 TABLET AT BEDTIME AS NEEDED (CAN TAKE 1 TABLET DURING THE DAY IF NEEDED FOR ANXIETY) Patient taking differently: Take 0.25 mg by mouth at bedtime as needed for anxiety.  11/14/19   Ward Givens, NP  Biotin 1000 MCG tablet Take 1,000 mcg by mouth daily.     [provider]  Blood Glucose Monitoring Suppl (PRODIGY VOICE BLOOD GLUCOSE) w/Device KIT Use to check blood sugar 1 time per day. 10/18/15   Renato Shin, MD  Cholecalciferol (VITAMIN D-3) 125 MCG (5000 UT) TABS Take 5,000 Units by mouth in the morning and at bedtime.    [provider]   cholestyramine light (PREVALITE) 4 g packet Take 0.5 packets by mouth in the morning and at bedtime. 11/29/19   [provider]  Cranberry-Vitamin C-Probiotic (AZO CRANBERRY PO) Take 1 tablet by mouth daily.    [provider]  cyanocobalamin (,VITAMIN B-12,) 1000 MCG/ML injection INJECT 1 ML INTRAMUSCULARLY EVERY 21 DAYS Patient taking differently: Inject 1,000 mcg into the muscle every 21 ( twenty-one) days.  06/18/16   Hoyt Koch, MD  diclofenac Sodium (VOLTAREN) 1 % GEL Apply 2 g topically 4 (four) times daily as needed (pain.).  06/01/19   [provider]  diphenhydrAMINE (BENADRYL) 25 MG tablet Take 25-50 mg by mouth every 6 (six) hours as needed (runny nose/allergies.).     [provider]  docusate sodium (COLACE) 100 MG capsule Take 200 mg by mouth 2 (two) times daily as needed (constipation.).     [provider]  escitalopram (LEXAPRO) 10 MG tablet Take 10 mg by mouth daily.    [provider]  glucose blood test strip Use to check blood sugar 1 time per day. 10/22/15   Renato Shin, MD  insulin aspart (NOVOLOG) 100 UNIT/ML FlexPen Inject 2-3 Units into the skin 3 (three) times daily as needed (blood sugars greater than 150=2 units & 200=3 units). Sliding Scale Insulin 06/02/19   [provider]  insulin glargine (LANTUS) 100 UNIT/ML injection Inject 14 Units into the skin at bedtime.     [provider]  Insulin Pen Needle 32G X 4 MM MISC Used to inject insulin 3x daily 11/19/16   Renato Shin, MD  levothyroxine (SYNTHROID) 75 MCG tablet Take 75 mcg by mouth daily before breakfast.    [provider]  lidocaine (LIDODERM) 5 % Place 1 patch onto the skin daily. Remove & Discard patch within 12 hours or as directed by MD Patient taking differently: Place 1-2 patches onto the skin daily as needed (pain). Remove & Discard patch within 12 hours or as directed by MD 01/25/15   Hoyt Koch, MD   metroNIDAZOLE (FLAGYL) 500 MG tablet Take 500 mg by mouth 3 (three) times daily.  11/14/19   [provider]  neomycin (MYCIFRADIN) 500 MG tablet Take 1,000 mg by mouth 3 (three) times daily.  11/15/19   [provider]  phenytoin (DILANTIN) 100 MG ER capsule Take 1 capsule (100 mg) in the morning and Take 2 capsules (200 mg) at bedtime Patient taking differently: Take 100-200 mg by mouth See admin instructions. Take 1 capsule (100 mg) in the morning & Take 2 capsules (200 mg) at bedtime 05/16/19   Ward Givens, NP  phenytoin (DILANTIN) 50 MG tablet Chew 50 mg by mouth daily. Total morning dose=150 mg    [provider]  Polyethyl Glycol-Propyl Glycol (SYSTANE OP) Place 1 drop into both eyes in the morning, at noon, in the evening, and at bedtime.     [provider]  primidone (MYSOLINE) 50 MG tablet Three tablets in the morning and one at dinner time Patient taking differently: Take 100-150 mg by mouth See admin instructions. Take 3 tablets (150 mg) by mouth in the morning & take 2 tablets (100 mg) by mouth in the afternoon. 12/04/19   Kathrynn Ducking, MD  silver sulfADIAZINE (SILVADENE) 1 % cream Apply 1 application topically daily as needed (itching.).    [provider]  Thiamine HCl (VITAMIN B-1) 250 MG tablet Take 250 mg by mouth at bedtime.    [provider]  traMADol (ULTRAM) 50 MG tablet Take 1 tablet (50 mg total) by mouth every 6 (six) hours as needed for severe pain. 01/02/20   Long, Wonda Olds, MD  trandolapril-verapamil (TARKA) 2-240 MG tablet Take 1 tablet by mouth at bedtime.     [provider]    Allergies    Bystolic [nebivolol hcl], Cholestyramine, Hydrazine yellow [tartrazine], Morphine, Niacin, Niaspan [niacin er], Norvasc [amlodipine besylate], Optivar [azelastine hcl], Repaglinide, Sular [nisoldipine er], Tegretol [carbamazepine], Telmisartan, Amoxicillin-pot clavulanate, Cefdinir, Ciprofloxacin hcl, Clonidine,  Clonidine hydrochloride, Codeine, Ezetimibe, Hydroxychloroquine, Naproxen, Sulfa antibiotics, Ziac [bisoprolol-hydrochlorothiazide], Azelastine, Elavil [amitriptyline], Empagliflozin, Hydralazine, Iodine,  Kenalog [triamcinolone], Keppra [levetiracetam], Lamictal [lamotrigine], Lyrica [pregabalin], Pseudoephedrine, Topamax [topiramate], Ace inhibitors, Actonel [risedronate sodium], Amlodipine besylate, Aspirin, Atacand [candesartan], Bextra [valdecoxib], Bisoprolol-hydrochlorothiazide, Candesartan cilexetil, Cefadroxil, Celecoxib, Hydrocodone, Hydrocodone-acetaminophen, Iodinated diagnostic agents, Meloxicam, Methylprednisolone sodium succinate, Nabumetone, Penicillins, Pseudoephedrine-guaifenesin, Risedronate sodium, Rofecoxib, Ru-tuss [chlorphen-pse-atrop-hyos-scop], Sulfonamide derivatives, Sulphur [sulfur], Telithromycin, Terfenadine, Trandolapril-verapamil hcl er, Trandolapril-verapamil hcl er, and Valium [diazepam]  Review of Systems   Review of Systems  Constitutional: Negative for fever.  Respiratory: Positive for cough. Negative for shortness of breath.   Cardiovascular: Negative for chest pain.  All other systems reviewed and are negative.   Physical Exam Updated Vital Signs BP (!) 156/87   Pulse 88   Temp 97.9 F (36.6 C) (Oral)   Resp 20   Ht 5' 7.5" (1.715 m)   Wt 61.2 kg   SpO2 98%   BMI 20.83 kg/m   Physical Exam Vitals and nursing note reviewed.  Constitutional:      General: She is not in acute distress.    Appearance: She is well-developed. She is not ill-appearing or diaphoretic.  HENT:     Head: Normocephalic and atraumatic.     Right Ear: External ear normal.     Left Ear: External ear normal.     Nose: Nose normal.  Eyes:     General:        Right eye: No discharge.        Left eye: No discharge.  Cardiovascular:     Rate and Rhythm: Normal rate and regular rhythm.     Heart sounds: Normal heart sounds.  Pulmonary:     Effort: Pulmonary effort is normal.      Breath sounds: Normal breath sounds.  Abdominal:     Palpations: Abdomen is soft.     Tenderness: There is no abdominal tenderness.  Skin:    General: Skin is warm and dry.  Neurological:     Mental Status: She is alert.  Psychiatric:        Mood and Affect: Mood is not anxious.     ED Results / Procedures / Treatments   Labs (all labs ordered are listed, but only abnormal results are displayed) Labs Reviewed  COMPREHENSIVE METABOLIC PANEL - Abnormal; Notable for the following components:      Result Value   Sodium 130 (*)    Glucose, Bld 205 (*)    Calcium 8.6 (*)    Total Protein 5.9 (*)    Albumin 3.1 (*)    All other components within normal limits  CBC WITH DIFFERENTIAL/PLATELET - Abnormal; Notable for the following components:   RBC 3.77 (*)    Hemoglobin 8.6 (*)    HCT 28.6 (*)    MCV 75.9 (*)    MCH 22.8 (*)    RDW 15.9 (*)    Platelets 437 (*)    All other components within normal limits    EKG None  Radiology DG Chest 2 View  Result Date: 02/17/2020 CLINICAL DATA:  Cough.  Hypertension.  Reported lung mass. EXAM: CHEST - 2 VIEW COMPARISON:  Chest CT November 28, 2019 FINDINGS: No parenchymal lung mass is appreciable on current radiographic examination. There is slight bibasilar atelectasis. No edema or airspace opacity. Heart size and pulmonary vascularity are normal. No adenopathy. There is anterior wedging in several mid to lower thoracic vertebral bodies. Patient has had previous kyphoplasty procedure at T12. IMPRESSION: No parenchymal lung mass appreciable by radiography. No edema or airspace opacity. Slight bibasilar atelectasis. Heart size normal. No  adenopathy evident. Electronically Signed   By: Lowella Grip III M.D.   On: 02/17/2020 13:09   CT Chest Wo Contrast  Result Date: 02/17/2020 CLINICAL DATA:  86 year old female with pulmonary nodule. Colon cancer. EXAM: CT CHEST WITHOUT CONTRAST TECHNIQUE: Multidetector CT imaging of the chest was  performed following the standard protocol without IV contrast. COMPARISON:  Chest CT dated 11/28/2019. FINDINGS: Evaluation of this exam is limited in the absence of intravenous contrast. Cardiovascular: There is no cardiomegaly or pericardial effusion. There is coronary vascular calcification. Small pericardial effusion anterior to the heart measures 5 mm in thickness. Mild atherosclerotic calcification of the thoracic aorta. The central pulmonary arteries are grossly unremarkable. Mediastinum/Nodes: No hilar or mediastinal adenopathy. A cluster of small subcarinal lymph nodes. The esophagus is grossly unremarkable. A 16 mm left thyroid hypodense nodule as seen on the prior CT. Recommend thyroid US (ref: J Am Coll Radiol. 2015 Feb;12(2): 143-50).No mediastinal fluid collection. Lungs/Pleura: There is a 3 mm right apical nodule (27/4). Probable trace bilateral pleural effusions with bibasilar atelectasis. No consolidative changes. No pneumothorax. The central airways are patent. Upper Abdomen: No acute abnormality. Musculoskeletal: Osteopenia with degenerative changes of the spine. Several old compression fractures including mild compression fracture of the superior endplate of T2 and T3, and T8. Partially visualized vertebroplasty changes of T12. No acute osseous pathology. IMPRESSION: 1. No acute intrathoracic pathology. 2. Probable trace bilateral pleural effusions with bibasilar atelectasis. 3. A 3 mm right apical nodule. 4. A 16 mm left thyroid hypodense nodule as seen on the prior CT. Recommend thyroid US (ref: J Am Coll Radiol. 5. Aortic Atherosclerosis (ICD10-I70.0). Electronically Signed   By: Anner Crete M.D.   On: 02/17/2020 21:50    Procedures Procedures (including critical care time)  Medications Ordered in ED Medications - No data to display  ED Course  I have reviewed the triage vital signs and the nursing notes.  Pertinent labs & imaging results that were available during my care of  the patient were reviewed by me and considered in my medical decision making (see chart for details).    MDM Rules/Calculators/A&P                          Given to x-ray reports from her facility that showed right hilar mass, CT was obtained despite negative chest x-ray here.  This CT is unremarkable besides a small lung nodule.  Otherwise she is anemic though her hemoglobin is improved compared to a couple months ago.  Given this with work-up already in progress I do not think she needs further work-up or treatment in the ED.  Will discharge back to her facility. Final Clinical Impression(s) / ED Diagnoses Final diagnoses:  Lung nodule    Rx / DC Orders ED Discharge Orders    None       Sherwood Gambler, MD 02/17/20 2240

## 2020-02-17 NOTE — ED Notes (Signed)
Sign and held orders noted to be from August.

## 2020-02-17 NOTE — ED Notes (Signed)
[facility] contacted by Eduard Clos, EMT

## 2020-02-17 NOTE — ED Notes (Addendum)
Called [facility] at another number found online - [phone], number no longer in service

## 2020-02-17 NOTE — ED Triage Notes (Signed)
Pt to triage via [facility] from [facility] for 3cm mass on R lung.  Pt states they did x-rays due to cough since August.   Denies pain.  Denies SOB.

## 2020-02-17 NOTE — ED Notes (Signed)
Attempted to call report at [place] at [phone], no answer.

## 2020-02-18 DIAGNOSIS — R5381 Other malaise: Secondary | ICD-10-CM | POA: Diagnosis not present

## 2020-02-18 DIAGNOSIS — R279 Unspecified lack of coordination: Secondary | ICD-10-CM | POA: Diagnosis not present

## 2020-02-18 DIAGNOSIS — Z743 Need for continuous supervision: Secondary | ICD-10-CM | POA: Diagnosis not present

## 2020-02-19 ENCOUNTER — Telehealth: Payer: Self-pay | Admitting: Podiatry

## 2020-02-19 DIAGNOSIS — I10 Essential (primary) hypertension: Secondary | ICD-10-CM | POA: Diagnosis not present

## 2020-02-19 DIAGNOSIS — D519 Vitamin B12 deficiency anemia, unspecified: Secondary | ICD-10-CM | POA: Diagnosis not present

## 2020-02-19 DIAGNOSIS — E785 Hyperlipidemia, unspecified: Secondary | ICD-10-CM | POA: Diagnosis not present

## 2020-02-19 DIAGNOSIS — R918 Other nonspecific abnormal finding of lung field: Secondary | ICD-10-CM | POA: Diagnosis not present

## 2020-02-19 DIAGNOSIS — S82892D Other fracture of left lower leg, subsequent encounter for closed fracture with routine healing: Secondary | ICD-10-CM | POA: Diagnosis not present

## 2020-02-19 DIAGNOSIS — D649 Anemia, unspecified: Secondary | ICD-10-CM | POA: Diagnosis not present

## 2020-02-19 DIAGNOSIS — E041 Nontoxic single thyroid nodule: Secondary | ICD-10-CM | POA: Diagnosis not present

## 2020-02-20 ENCOUNTER — Ambulatory Visit: Payer: Medicare Other | Admitting: Podiatry

## 2020-02-20 DIAGNOSIS — M1612 Unilateral primary osteoarthritis, left hip: Secondary | ICD-10-CM | POA: Diagnosis not present

## 2020-02-20 DIAGNOSIS — M19022 Primary osteoarthritis, left elbow: Secondary | ICD-10-CM | POA: Diagnosis not present

## 2020-02-20 DIAGNOSIS — M533 Sacrococcygeal disorders, not elsewhere classified: Secondary | ICD-10-CM | POA: Diagnosis not present

## 2020-02-26 NOTE — Telephone Encounter (Signed)
error 

## 2020-02-29 ENCOUNTER — Other Ambulatory Visit: Payer: Self-pay | Admitting: Family Medicine

## 2020-02-29 DIAGNOSIS — E785 Hyperlipidemia, unspecified: Secondary | ICD-10-CM | POA: Diagnosis not present

## 2020-02-29 DIAGNOSIS — K219 Gastro-esophageal reflux disease without esophagitis: Secondary | ICD-10-CM | POA: Diagnosis not present

## 2020-02-29 DIAGNOSIS — E118 Type 2 diabetes mellitus with unspecified complications: Secondary | ICD-10-CM | POA: Diagnosis not present

## 2020-02-29 DIAGNOSIS — E041 Nontoxic single thyroid nodule: Secondary | ICD-10-CM

## 2020-02-29 DIAGNOSIS — H1031 Unspecified acute conjunctivitis, right eye: Secondary | ICD-10-CM | POA: Diagnosis not present

## 2020-02-29 DIAGNOSIS — R569 Unspecified convulsions: Secondary | ICD-10-CM | POA: Diagnosis not present

## 2020-02-29 DIAGNOSIS — D519 Vitamin B12 deficiency anemia, unspecified: Secondary | ICD-10-CM | POA: Diagnosis not present

## 2020-02-29 DIAGNOSIS — I10 Essential (primary) hypertension: Secondary | ICD-10-CM | POA: Diagnosis not present

## 2020-03-01 ENCOUNTER — Encounter (HOSPITAL_COMMUNITY): Payer: Self-pay

## 2020-03-01 NOTE — Patient Instructions (Addendum)
DUE TO COVID-19 ONLY ONE VISITOR IS ALLOWED TO COME WITH YOU AND STAY IN THE WAITING ROOM ONLY DURING PRE OP AND PROCEDURE.   IF YOU WILL BE ADMITTED INTO THE HOSPITAL YOU ARE ALLOWED ONE SUPPORT PERSON DURING VISITATION HOURS ONLY (10AM -8PM)   . The support person [place] change daily. . The support person must pass our screening, gel in and out, and wear a mask at all times, including in the patient's room. . Patients must also wear a mask when staff or their support person are in the room.   COVID SWAB TESTING MUST BE COMPLETED ON:  MORNING OF SURGERY      Your procedure is scheduled on: Wednesday, Nov. 3, 2021   Report to [facility] Main  Entrance    Report to admitting at 8:00 AM   Call this number if you have problems the morning of surgery [phone]   Please increase fluid intake the day before surgery to prevent dehydration   Dulcolax:  Give with water the day prior to surgery.    Neomycin and Metronidazole:  At 2 pm, 3 pm and 10 pm after Miralax bowel prep the day prior to surgery.    Miralax: Mix with 64 oz Gatorade/Powerade. Drink gradually over the next few hours (8 oz glass every 15-30 minutes) until gone the day prior to surgery   Drink 2 Gatorade drink at 10:00 PM the night before surgery   Do not eat food :After Midnight.   [place] have liquids until 8:00 AM   day of surgery  CLEAR LIQUID DIET  Foods Allowed                                                                     Foods Excluded  Water, Black Coffee and tea, regular and decaf                             liquids that you cannot  Plain Jell-O in any flavor  (No red)                                           see through such as: Fruit ices (not with fruit pulp)                                     milk, soups, orange juice              Iced Popsicles (No red)                                    All solid food                                   Apple juices Sports drinks like Gatorade (No  red) Lightly seasoned clear broth or consume(fat free) Sugar, honey syrup  Sample Menu Breakfast  Lunch                                     Supper Cranberry juice                    Beef broth                            Chicken broth Jell-O                                     Grape juice                           Apple juice Coffee or tea                        Jell-O                                      Popsicle                                                Coffee or tea                        Coffee or tea      Complete one Gatorade drink the morning of surgery at   8:00 AM    the day of surgery.   Oral Hygiene is also important to reduce your risk of infection.                                    Remember - BRUSH YOUR TEETH THE MORNING OF SURGERY WITH YOUR REGULAR TOOTHPASTE   Do NOT smoke after Midnight   Take these medicines the morning of surgery with A SIP OF WATER: Escitalopram, Levothyroxine, Phenytoin, Primidone, Tramadol   THE NIGHT BEFORE SURGERY ONLY TAKE HALF (1/2) INSULIN GLARGINE  DO NOT TAKE ANY ORAL DIABETIC MEDICATIONS DAY OF YOUR SURGERY                               You Bibian not have any metal on your body including hair pins, jewelry, and body piercings             Do not wear make-up, lotions, powders, perfumes/cologne, or deodorant             Do not wear nail polish.  Do not shave  48 hours prior to surgery.              Do not bring valuables to the hospital. [place].   Contacts, dentures or bridgework [place] not be worn into surgery.   Bring small overnight bag day of surgery.    Patients discharged the day of surgery will  not be allowed to drive home.   Special Instructions: Bring a copy of your healthcare power of attorney and living will documents         the day of surgery if you haven't scanned them in before.              Please read over the following fact sheets you  were given: IF YOU HAVE QUESTIONS ABOUT YOUR PRE OP [place] [phone]   How to Manage Your Diabetes Before and After Surgery  Why is it important to control my blood sugar before and after surgery? . Improving blood sugar levels before and after surgery helps healing and can limit problems. . A way of improving blood sugar control is eating a healthy diet by: o  Eating less sugar and carbohydrates o  Increasing activity/exercise o  Talking with your doctor about reaching your blood sugar goals . High blood sugars (greater than 180 mg/dL) can raise your risk of infections and slow your recovery, so you will need to focus on controlling your diabetes during the weeks before surgery. . Make sure that the doctor who takes care of your diabetes knows about your planned surgery including the date and location.  How do I manage my blood sugar before surgery? . Check your blood sugar at least 4 times a day, starting 2 days before surgery, to make sure that the level is not too high or low. o Check your blood sugar the morning of your surgery when you wake up and every 2 hours until you get to the Short Stay unit. . If your blood sugar is less than 70 mg/dL, you will need to treat for low blood sugar: o Do not take insulin. o Treat a low blood sugar (less than 70 mg/dL) with  cup of clear juice (cranberry or apple), 4 glucose tablets, OR glucose gel. o Recheck blood sugar in 15 minutes after treatment (to make sure it is greater than 70 mg/dL). If your blood sugar is not greater than 70 mg/dL on recheck, call [phone] for further instructions. . Report your blood sugar to the short stay nurse when you get to Short Stay.  . If you are admitted to the hospital after surgery: o Your blood sugar will be checked by the staff and you will probably be given insulin after surgery (instead of oral diabetes medicines) to make sure you have good blood sugar levels. o The goal for blood  sugar control after surgery is 80-180 mg/dL.   WHAT DO I DO ABOUT MY DIABETES MEDICATION?  Marland Kitchen Do not take oral diabetes medicines (pills) the morning of surgery.  . THE NIGHT BEFORE SURGERY, take HALF    units of   GLARGINE    insulin.       The day of surgery, do not take other diabetes injectables, including Byetta (exenatide), Bydureon (exenatide ER), Victoza (liraglutide), or Trulicity (dulaglutide).  . If your CBG is greater than 220 mg/dL, you [place] take  of your sliding scale  . (correction) dose of insulin.  Reviewed and Endorsed by [facility] Patient Education Committee, August 2015  [facility] - Preparing for Surgery Before surgery, you can play an important role.  Because skin is not sterile, your skin needs to be as free of germs as possible.  You can reduce the number of germs on your skin by washing with CHG (chlorahexidine gluconate) soap before surgery.  CHG is an antiseptic cleaner which kills germs and bonds  with the skin to continue killing germs even after washing. Please DO NOT use if you have an allergy to CHG or antibacterial soaps.  If your skin becomes reddened/irritated stop using the CHG and inform your nurse when you arrive at Short Stay. Do not shave (including legs and underarms) for at least 48 hours prior to the first CHG shower.  You [place] shave your face/neck.  Please follow these instructions carefully:  1.  Shower with CHG Soap the night before surgery and the  morning of surgery.  2.  If you choose to wash your hair, wash your hair first as usual with your normal  shampoo.  3.  After you shampoo, rinse your hair and body thoroughly to remove the shampoo.                             4.  Use CHG as you would any other liquid soap.  You can apply chg directly to the skin and wash.  Gently with a scrungie or clean washcloth.  5.  Apply the CHG Soap to your body ONLY FROM THE NECK DOWN.   Do   not use on face/ open                           Wound or open  sores. Avoid contact with eyes, ears mouth and   genitals (private parts).                       Wash face,  Genitals (private parts) with your normal soap.             6.  Wash thoroughly, paying special attention to the area where your    surgery  will be performed.  7.  Thoroughly rinse your body with warm water from the neck down.  8.  DO NOT shower/wash with your normal soap after using and rinsing off the CHG Soap.                9.  Pat yourself dry with a clean towel.            10.  Wear clean pajamas.            11.  Place clean sheets on your bed the night of your first shower and do not  sleep with pets. Day of Surgery : Do not apply any lotions/deodorants the morning of surgery.  Please wear clean clothes to the hospital/surgery center.  FAILURE TO FOLLOW THESE INSTRUCTIONS [place] RESULT IN THE CANCELLATION OF YOUR SURGERY  PATIENT SIGNATURE_________________________________  NURSE SIGNATURE__________________________________  ________________________________________________________________________   Adam Phenix  An incentive spirometer is a tool that can help keep your lungs clear and active. This tool measures how well you are filling your lungs with each breath. Taking long deep breaths [place] help reverse or decrease the chance of developing breathing (pulmonary) problems (especially infection) following:  A long period of time when you are unable to move or be active. BEFORE THE PROCEDURE   If the spirometer includes an indicator to show your best effort, your nurse or respiratory therapist will set it to a desired goal.  If possible, sit up straight or lean slightly forward. Try not to slouch.  Hold the incentive spirometer in an upright position. INSTRUCTIONS FOR USE  1. Sit on the edge of your bed if possible, or sit  up as far as you can in bed or on a chair. 2. Hold the incentive spirometer in an upright position. 3. Breathe out normally. 4. Place the  mouthpiece in your mouth and seal your lips tightly around it. 5. Breathe in slowly and as deeply as possible, raising the piston or the ball toward the top of the column. 6. Hold your breath for 3-5 seconds or for as long as possible. Allow the piston or ball to fall to the bottom of the column. 7. Remove the mouthpiece from your mouth and breathe out normally. 8. Rest for a few seconds and repeat Steps 1 through 7 at least 10 times every 1-2 hours when you are awake. Take your time and take a few normal breaths between deep breaths. 9. The spirometer [place] include an indicator to show your best effort. Use the indicator as a goal to work toward during each repetition. 10. After each set of 10 deep breaths, practice coughing to be sure your lungs are clear. If you have an incision (the cut made at the time of surgery), support your incision when coughing by placing a pillow or rolled up towels firmly against it. Once you are able to get out of bed, walk around indoors and cough well. You [place] stop using the incentive spirometer when instructed by your caregiver.  RISKS AND COMPLICATIONS  Take your time so you do not get dizzy or light-headed.  If you are in pain, you [place] need to take or ask for pain medication before doing incentive spirometry. It is harder to take a deep breath if you are having pain. AFTER USE  Rest and breathe slowly and easily.  It can be helpful to keep track of a log of your progress. Your caregiver can provide you with a simple table to help with this. If you are using the spirometer at home, follow these instructions: [place] IF:   You are having difficultly using the spirometer.  You have trouble using the spirometer as often as instructed.  Your pain medication is not giving enough relief while using the spirometer.  You develop fever of 100.5 F (38.1 C) or higher. SEEK IMMEDIATE MEDICAL CARE IF:   You cough up bloody sputum that had not been present  before.  You develop fever of 102 F (38.9 C) or greater.  You develop worsening pain at or near the incision site. MAKE SURE YOU:   Understand these instructions.  Will watch your condition.  Will get help right away if you are not doing well or get worse. Document Released: 08/31/2006 Document Revised: 07/13/2011 Document Reviewed: 11/01/2006 ExitCare Patient Information 2014 ExitCare, [place].   ________________________________________________________________________  WHAT IS A BLOOD TRANSFUSION? Blood Transfusion Information  A transfusion is the replacement of blood or some of its parts. Blood is made up of multiple cells which provide different functions.  Red blood cells carry oxygen and are used for blood loss replacement.  White blood cells fight against infection.  Platelets control bleeding.  Plasma helps clot blood.  Other blood products are available for specialized needs, such as hemophilia or other clotting disorders. BEFORE THE TRANSFUSION  Who gives blood for transfusions?   Healthy volunteers who are fully evaluated to make sure their blood is safe. This is blood bank blood. Transfusion therapy is the safest it has ever been in the practice of medicine. Before blood is taken from a donor, a complete history is taken to make sure that person has  no history of diseases nor engages in risky social behavior (examples are intravenous drug use or sexual activity with multiple partners). The donor's travel history is screened to minimize risk of transmitting infections, such as malaria. The donated blood is tested for signs of infectious diseases, such as HIV and hepatitis. The blood is then tested to be sure it is compatible with you in order to minimize the chance of a transfusion reaction. If you or a relative donates blood, this is often done in anticipation of surgery and is not appropriate for emergency situations. It takes many days to process the donated  blood. RISKS AND COMPLICATIONS Although transfusion therapy is very safe and saves many lives, the main dangers of transfusion include:   Getting an infectious disease.  Developing a transfusion reaction. This is an allergic reaction to something in the blood you were given. Every precaution is taken to prevent this. The decision to have a blood transfusion has been considered carefully by your caregiver before blood is given. Blood is not given unless the benefits outweigh the risks. AFTER THE TRANSFUSION  Right after receiving a blood transfusion, you will usually feel much better and more energetic. This is especially true if your red blood cells have gotten low (anemic). The transfusion raises the level of the red blood cells which carry oxygen, and this usually causes an energy increase.  The nurse administering the transfusion will monitor you carefully for complications. HOME CARE INSTRUCTIONS  No special instructions are needed after a transfusion. You Hyun find your energy is better. Speak with your caregiver about any limitations on activity for underlying diseases you [place] have. SEEK MEDICAL CARE IF:   Your condition is not improving after your transfusion.  You develop redness or irritation at the intravenous (IV) site. SEEK IMMEDIATE MEDICAL CARE IF:  Any of the following symptoms occur over the next 12 hours:  Shaking chills.  You have a temperature by mouth above 102 F (38.9 C), not controlled by medicine.  Chest, back, or muscle pain.  People around you feel you are not acting correctly or are confused.  Shortness of breath or difficulty breathing.  Dizziness and fainting.  You get a rash or develop hives.  You have a decrease in urine output.  Your urine turns a dark color or changes to pink, red, or brown. Any of the following symptoms occur over the next 10 days:  You have a temperature by mouth above 102 F (38.9 C), not controlled by  medicine.  Shortness of breath.  Weakness after normal activity.  The white part of the eye turns yellow (jaundice).  You have a decrease in the amount of urine or are urinating less often.  Your urine turns a dark color or changes to pink, red, or brown. Document Released: 04/17/2000 Document Revised: 07/13/2011 Document Reviewed: 12/05/2007 [facility] Patient Information 2014 [place], [place].  _______________________________________________________________________

## 2020-03-01 NOTE — Progress Notes (Addendum)
COVID Vaccine Completed: Yes Date COVID Vaccine completed: 05/25/19, 06/14/19 COVID vaccine manufacturer: Pfizer      PCP - Dr. Precious Haws Cardiologist - N/A  Chest x-ray - 02/17/20 in epic EKG - 12/05/19 requested from [facility] E[facility] - 12/14/19 in epic Cardiac Cath - N/A Pacemaker/ICD device last checked: N/A  Sleep Study - N/A CPAP - N/A  Fasting Blood Sugar - below 200 Checks Blood Sugar __1___ times a day  Blood Thinner Instructions:  N/A Aspirin Instructions: N/A Last Dose: N/A  Anesthesia review: Diabetes  Patient denies shortness of breath, fever, cough and chest pain at PAT appointment   Patient verbalized understanding of instructions that were given to them at the PAT appointment. Patient was also instructed that they will need to review over the PAT instructions again at home before surgery.

## 2020-03-02 ENCOUNTER — Other Ambulatory Visit (HOSPITAL_COMMUNITY): Payer: Medicare Other

## 2020-03-04 ENCOUNTER — Other Ambulatory Visit (HOSPITAL_COMMUNITY): Payer: Medicare Other

## 2020-03-04 ENCOUNTER — Encounter (HOSPITAL_COMMUNITY): Payer: Self-pay

## 2020-03-04 ENCOUNTER — Other Ambulatory Visit: Payer: Self-pay

## 2020-03-04 ENCOUNTER — Encounter (HOSPITAL_COMMUNITY)
Admission: RE | Admit: 2020-03-04 | Discharge: 2020-03-04 | Disposition: A | Discharge status: Home / Self Care | Payer: Medicare Other | Source: Ambulatory Visit | Attending: Surgery | Admitting: Surgery

## 2020-03-04 DIAGNOSIS — R569 Unspecified convulsions: Secondary | ICD-10-CM | POA: Diagnosis not present

## 2020-03-04 DIAGNOSIS — A419 Sepsis, unspecified organism: Secondary | ICD-10-CM | POA: Diagnosis not present

## 2020-03-04 DIAGNOSIS — R000 Tachycardia, unspecified: Secondary | ICD-10-CM | POA: Diagnosis not present

## 2020-03-04 DIAGNOSIS — C182 Malignant neoplasm of ascending colon: Secondary | ICD-10-CM | POA: Diagnosis not present

## 2020-03-04 DIAGNOSIS — E785 Hyperlipidemia, unspecified: Secondary | ICD-10-CM | POA: Diagnosis not present

## 2020-03-04 DIAGNOSIS — K862 Cyst of pancreas: Secondary | ICD-10-CM | POA: Diagnosis not present

## 2020-03-04 DIAGNOSIS — E118 Type 2 diabetes mellitus with unspecified complications: Secondary | ICD-10-CM | POA: Diagnosis not present

## 2020-03-04 DIAGNOSIS — T888XXA Other specified complications of surgical and medical care, not elsewhere classified, initial encounter: Secondary | ICD-10-CM | POA: Diagnosis not present

## 2020-03-04 DIAGNOSIS — K8689 Other specified diseases of pancreas: Secondary | ICD-10-CM | POA: Diagnosis not present

## 2020-03-04 DIAGNOSIS — D519 Vitamin B12 deficiency anemia, unspecified: Secondary | ICD-10-CM | POA: Diagnosis not present

## 2020-03-04 DIAGNOSIS — R188 Other ascites: Secondary | ICD-10-CM | POA: Diagnosis not present

## 2020-03-04 DIAGNOSIS — K219 Gastro-esophageal reflux disease without esophagitis: Secondary | ICD-10-CM | POA: Diagnosis not present

## 2020-03-04 DIAGNOSIS — Z23 Encounter for immunization: Secondary | ICD-10-CM | POA: Diagnosis not present

## 2020-03-04 DIAGNOSIS — Z01818 Encounter for other preprocedural examination: Secondary | ICD-10-CM | POA: Insufficient documentation

## 2020-03-04 DIAGNOSIS — K661 Hemoperitoneum: Secondary | ICD-10-CM | POA: Diagnosis not present

## 2020-03-04 DIAGNOSIS — Z20822 Contact with and (suspected) exposure to covid-19: Secondary | ICD-10-CM | POA: Diagnosis not present

## 2020-03-04 DIAGNOSIS — E871 Hypo-osmolality and hyponatremia: Secondary | ICD-10-CM | POA: Diagnosis not present

## 2020-03-04 DIAGNOSIS — C189 Malignant neoplasm of colon, unspecified: Secondary | ICD-10-CM | POA: Diagnosis not present

## 2020-03-04 DIAGNOSIS — I700 Atherosclerosis of aorta: Secondary | ICD-10-CM | POA: Diagnosis not present

## 2020-03-04 DIAGNOSIS — S82892D Other fracture of left lower leg, subsequent encounter for closed fracture with routine healing: Secondary | ICD-10-CM | POA: Diagnosis not present

## 2020-03-04 DIAGNOSIS — F411 Generalized anxiety disorder: Secondary | ICD-10-CM | POA: Diagnosis not present

## 2020-03-04 HISTORY — DX: Carpal tunnel syndrome, bilateral upper limbs: G56.03

## 2020-03-04 HISTORY — DX: Nontoxic single thyroid nodule: E04.1

## 2020-03-04 HISTORY — DX: Hypothyroidism, unspecified: E03.9

## 2020-03-04 HISTORY — DX: Unspecified chronic bronchitis: J42

## 2020-03-04 HISTORY — DX: Vitamin D deficiency, unspecified: E55.9

## 2020-03-04 HISTORY — DX: Hypokalemia: E87.6

## 2020-03-04 HISTORY — DX: Age-related osteoporosis without current pathological fracture: M81.0

## 2020-03-04 HISTORY — DX: Disorder of arteries and arterioles, unspecified: I77.9

## 2020-03-04 HISTORY — DX: Personal history of other benign neoplasm: Z86.018

## 2020-03-04 HISTORY — DX: Hypo-osmolality and hyponatremia: E87.1

## 2020-03-04 HISTORY — DX: Irritable bowel syndrome, unspecified: K58.9

## 2020-03-04 HISTORY — DX: Unspecified asthma, uncomplicated: J45.909

## 2020-03-04 HISTORY — DX: Syncope and collapse: R55

## 2020-03-04 LAB — COMPREHENSIVE METABOLIC PANEL
ALT: 35 U/L (ref 0–44)
AST: 30 U/L (ref 15–41)
Albumin: 3.7 g/dL (ref 3.5–5.0)
Alkaline Phosphatase: 140 U/L — ABNORMAL HIGH (ref 38–126)
Anion gap: 11 (ref 5–15)
BUN: 10 mg/dL (ref 8–23)
CO2: 22 mmol/L (ref 22–32)
Calcium: 9 mg/dL (ref 8.9–10.3)
Chloride: 97 mmol/L — ABNORMAL LOW (ref 98–111)
Creatinine, Ser: 0.69 mg/dL (ref 0.44–1.00)
GFR, Estimated: >60 mL/min (ref >60)
Glucose, Bld: 71 mg/dL (ref 70–99)
Potassium: 4.9 mmol/L (ref 3.5–5.1)
Sodium: 130 mmol/L — ABNORMAL LOW (ref 135–145)
Total Bilirubin: 0.4 mg/dL (ref 0.3–1.2)
Total Protein: 7 g/dL (ref 6.5–8.1)

## 2020-03-04 LAB — CBC WITH DIFFERENTIAL/PLATELET
Abs Immature Granulocytes: 0.02 10*3/uL (ref 0.00–0.07)
Basophils Absolute: 0 10*3/uL (ref 0.0–0.1)
Basophils Relative: 0 %
Eosinophils Absolute: 0.2 10*3/uL (ref 0.0–0.5)
Eosinophils Relative: 2 %
HCT: 27.1 % — ABNORMAL LOW (ref 36.0–46.0)
Hemoglobin: 8.2 g/dL — ABNORMAL LOW (ref 12.0–15.0)
Immature Granulocytes: 0 %
Lymphocytes Relative: 20 %
Lymphs Abs: 1.8 10*3/uL (ref 0.7–4.0)
MCH: 22.6 pg — ABNORMAL LOW (ref 26.0–34.0)
MCHC: 30.3 g/dL (ref 30.0–36.0)
MCV: 74.7 fL — ABNORMAL LOW (ref 80.0–100.0)
Monocytes Absolute: 0.9 10*3/uL (ref 0.1–1.0)
Monocytes Relative: 10 %
Neutro Abs: 6.2 10*3/uL (ref 1.7–7.7)
Neutrophils Relative %: 68 %
Platelets: 556 10*3/uL — ABNORMAL HIGH (ref 150–400)
RBC: 3.63 MIL/uL — ABNORMAL LOW (ref 3.87–5.11)
RDW: 16.4 % — ABNORMAL HIGH (ref 11.5–15.5)
WBC: 9 10*3/uL (ref 4.0–10.5)
nRBC: 0 % (ref 0.0–0.2)

## 2020-03-04 LAB — PROTIME-INR
INR: 1 (ref 0.8–1.2)
Prothrombin Time: 12.8 seconds (ref 11.4–15.2)

## 2020-03-04 LAB — APTT: aPTT: 33 seconds (ref 24–36)

## 2020-03-04 LAB — GLUCOSE, CAPILLARY: Glucose-Capillary: 142 mg/dL — ABNORMAL HIGH (ref 70–99)

## 2020-03-04 LAB — HEMOGLOBIN A1C
Hgb A1c MFr Bld: 8.6 % — ABNORMAL HIGH (ref 4.8–5.6)
Mean Plasma Glucose: 200.12 mg/dL

## 2020-03-05 MED ORDER — GENTAMICIN SULFATE 40 MG/ML IJ SOLN
280.0000 mg | INTRAVENOUS | Status: AC
  Administered 2020-03-06: 280 mg via INTRAVENOUS
  Filled 2020-03-05: qty 7

## 2020-03-05 MED ORDER — BUPIVACAINE LIPOSOME 1.3 % IJ SUSP
20.0000 mL | INTRAMUSCULAR | Status: DC
  Filled 2020-03-05: qty 20

## 2020-03-05 MED ORDER — CLINDAMYCIN PHOSPHATE 900 MG/50ML IV SOLN
900.0000 mg | INTRAVENOUS | Status: AC
  Administered 2020-03-06: 900 mg via INTRAVENOUS

## 2020-03-05 NOTE — Anesthesia Preprocedure Evaluation (Addendum)
Anesthesia Evaluation  Patient identified by MRN, date of birth, ID band Patient awake    Reviewed: Allergy & Precautions, NPO status , Patient's Chart, lab work & pertinent test results  History of Anesthesia Complications Negative for: history of anesthetic complications  Airway Mallampati: I  TM Distance: >3 FB Neck ROM: Full    Dental  (+) Teeth Intact   Pulmonary asthma ,    Pulmonary exam normal        Cardiovascular hypertension, Normal cardiovascular exam     Neuro/Psych Seizures -, Well Controlled,  Anxiety H/o meningioma    GI/Hepatic Neg liver ROS, GERD  ,Colon cancer   Endo/Other  diabetes, Type 2, Insulin DependentHypothyroidism   Renal/GU negative Renal ROS  negative genitourinary   Musculoskeletal  (+) Arthritis ,   Abdominal   Peds  Hematology  (+) anemia ,   Anesthesia Other Findings  H/o breast cancer  Echo 12/14/19: EF 60-65%, normal RV function, mildly elevated PASP, mild aortic sclerosis w/o stenosis  Reproductive/Obstetrics                          Anesthesia Physical Anesthesia Plan  ASA: III  Anesthesia Plan: General   Post-op Pain Management:    Induction: Intravenous  PONV Risk Score and Plan: 4 or greater and Ondansetron, Dexamethasone, Treatment [place] vary due to age or medical condition and Midazolam  Airway Management Planned: Oral ETT  Additional Equipment: Arterial line  Intra-op Plan:   Post-operative Plan: Extubation in OR  Informed Consent: I have reviewed the patients History and Physical, chart, labs and discussed the procedure including the risks, benefits and alternatives for the proposed anesthesia with the patient or authorized representative who has indicated his/her understanding and acceptance.     Dental advisory given  Plan Discussed with:   Anesthesia Plan Comments: (Per cardiology note 12/11/2019, "86 year old female referred  for preoperative cardiovascular evaluation. ECG borderline abnormal with sinus rhythm, left axis deviation, and possible old inferior MI. Patient denies clinical history of myocardial infarction, stroke, congestive heart failure chronic kidney disease. Patient has essential hypertension, blood pressure well controlled on current BP medications. The patient should be at low and acceptable risk for low risk laparoscopic right hemicolectomy.")       Anesthesia Quick Evaluation

## 2020-03-06 ENCOUNTER — Inpatient Hospital Stay (HOSPITAL_COMMUNITY): Payer: Medicare Other | Admitting: Physician Assistant

## 2020-03-06 ENCOUNTER — Encounter (HOSPITAL_COMMUNITY)
Admission: RE | Disposition: A | Discharge status: Home Health | Payer: Self-pay | Source: Home / Self Care | Attending: Surgery

## 2020-03-06 ENCOUNTER — Encounter (HOSPITAL_COMMUNITY): Payer: Self-pay | Admitting: Surgery

## 2020-03-06 ENCOUNTER — Other Ambulatory Visit: Payer: Self-pay

## 2020-03-06 ENCOUNTER — Inpatient Hospital Stay (HOSPITAL_COMMUNITY)
Admission: RE | Admit: 2020-03-06 | Discharge: 2020-03-12 | DRG: 329 | Disposition: A | Discharge status: Home Health | Payer: Medicare Other | Attending: Surgery | Admitting: Surgery

## 2020-03-06 DIAGNOSIS — Z923 Personal history of irradiation: Secondary | ICD-10-CM

## 2020-03-06 DIAGNOSIS — Z9049 Acquired absence of other specified parts of digestive tract: Secondary | ICD-10-CM | POA: Diagnosis not present

## 2020-03-06 DIAGNOSIS — D62 Acute posthemorrhagic anemia: Secondary | ICD-10-CM | POA: Diagnosis not present

## 2020-03-06 DIAGNOSIS — E782 Mixed hyperlipidemia: Secondary | ICD-10-CM | POA: Diagnosis present

## 2020-03-06 DIAGNOSIS — W1830XD Fall on same level, unspecified, subsequent encounter: Secondary | ICD-10-CM | POA: Diagnosis not present

## 2020-03-06 DIAGNOSIS — Y838 Other surgical procedures as the cause of abnormal reaction of the patient, or of later complication, without mention of misadventure at the time of the procedure: Secondary | ICD-10-CM | POA: Diagnosis not present

## 2020-03-06 DIAGNOSIS — T888XXA Other specified complications of surgical and medical care, not elsewhere classified, initial encounter: Secondary | ICD-10-CM | POA: Diagnosis not present

## 2020-03-06 DIAGNOSIS — R112 Nausea with vomiting, unspecified: Secondary | ICD-10-CM | POA: Diagnosis not present

## 2020-03-06 DIAGNOSIS — K661 Hemoperitoneum: Secondary | ICD-10-CM | POA: Diagnosis not present

## 2020-03-06 DIAGNOSIS — Z9189 Other specified personal risk factors, not elsewhere classified: Secondary | ICD-10-CM

## 2020-03-06 DIAGNOSIS — Z23 Encounter for immunization: Secondary | ICD-10-CM | POA: Diagnosis not present

## 2020-03-06 DIAGNOSIS — G8929 Other chronic pain: Secondary | ICD-10-CM | POA: Diagnosis present

## 2020-03-06 DIAGNOSIS — D649 Anemia, unspecified: Secondary | ICD-10-CM | POA: Diagnosis not present

## 2020-03-06 DIAGNOSIS — S82309D Unspecified fracture of lower end of unspecified tibia, subsequent encounter for closed fracture with routine healing: Secondary | ICD-10-CM

## 2020-03-06 DIAGNOSIS — R109 Unspecified abdominal pain: Secondary | ICD-10-CM | POA: Diagnosis not present

## 2020-03-06 DIAGNOSIS — G250 Essential tremor: Secondary | ICD-10-CM | POA: Diagnosis present

## 2020-03-06 DIAGNOSIS — Z743 Need for continuous supervision: Secondary | ICD-10-CM | POA: Diagnosis not present

## 2020-03-06 DIAGNOSIS — K567 Ileus, unspecified: Secondary | ICD-10-CM | POA: Diagnosis not present

## 2020-03-06 DIAGNOSIS — J302 Other seasonal allergic rhinitis: Secondary | ICD-10-CM | POA: Diagnosis present

## 2020-03-06 DIAGNOSIS — E039 Hypothyroidism, unspecified: Secondary | ICD-10-CM

## 2020-03-06 DIAGNOSIS — K589 Irritable bowel syndrome without diarrhea: Secondary | ICD-10-CM | POA: Diagnosis present

## 2020-03-06 DIAGNOSIS — Z20822 Contact with and (suspected) exposure to covid-19: Secondary | ICD-10-CM | POA: Diagnosis not present

## 2020-03-06 DIAGNOSIS — Z794 Long term (current) use of insulin: Secondary | ICD-10-CM

## 2020-03-06 DIAGNOSIS — E559 Vitamin D deficiency, unspecified: Secondary | ICD-10-CM | POA: Diagnosis not present

## 2020-03-06 DIAGNOSIS — R269 Unspecified abnormalities of gait and mobility: Secondary | ICD-10-CM

## 2020-03-06 DIAGNOSIS — R52 Pain, unspecified: Secondary | ICD-10-CM | POA: Diagnosis not present

## 2020-03-06 DIAGNOSIS — R000 Tachycardia, unspecified: Secondary | ICD-10-CM | POA: Diagnosis not present

## 2020-03-06 DIAGNOSIS — Z85820 Personal history of malignant melanoma of skin: Secondary | ICD-10-CM

## 2020-03-06 DIAGNOSIS — R279 Unspecified lack of coordination: Secondary | ICD-10-CM | POA: Diagnosis not present

## 2020-03-06 DIAGNOSIS — E871 Hypo-osmolality and hyponatremia: Secondary | ICD-10-CM | POA: Diagnosis not present

## 2020-03-06 DIAGNOSIS — Z889 Allergy status to unspecified drugs, medicaments and biological substances status: Secondary | ICD-10-CM

## 2020-03-06 DIAGNOSIS — Z886 Allergy status to analgesic agent status: Secondary | ICD-10-CM

## 2020-03-06 DIAGNOSIS — R600 Localized edema: Secondary | ICD-10-CM | POA: Diagnosis not present

## 2020-03-06 DIAGNOSIS — M810 Age-related osteoporosis without current pathological fracture: Secondary | ICD-10-CM | POA: Diagnosis present

## 2020-03-06 DIAGNOSIS — C182 Malignant neoplasm of ascending colon: Secondary | ICD-10-CM | POA: Diagnosis not present

## 2020-03-06 DIAGNOSIS — F29 Unspecified psychosis not due to a substance or known physiological condition: Secondary | ICD-10-CM | POA: Diagnosis not present

## 2020-03-06 DIAGNOSIS — R110 Nausea: Secondary | ICD-10-CM | POA: Diagnosis not present

## 2020-03-06 DIAGNOSIS — Z882 Allergy status to sulfonamides status: Secondary | ICD-10-CM

## 2020-03-06 DIAGNOSIS — Z800 Family history of malignant neoplasm of digestive organs: Secondary | ICD-10-CM

## 2020-03-06 DIAGNOSIS — F419 Anxiety disorder, unspecified: Secondary | ICD-10-CM | POA: Diagnosis not present

## 2020-03-06 DIAGNOSIS — Z8249 Family history of ischemic heart disease and other diseases of the circulatory system: Secondary | ICD-10-CM

## 2020-03-06 DIAGNOSIS — M6281 Muscle weakness (generalized): Secondary | ICD-10-CM | POA: Diagnosis not present

## 2020-03-06 DIAGNOSIS — E119 Type 2 diabetes mellitus without complications: Secondary | ICD-10-CM

## 2020-03-06 DIAGNOSIS — E1165 Type 2 diabetes mellitus with hyperglycemia: Secondary | ICD-10-CM | POA: Diagnosis not present

## 2020-03-06 DIAGNOSIS — D509 Iron deficiency anemia, unspecified: Secondary | ICD-10-CM

## 2020-03-06 DIAGNOSIS — Z91018 Allergy to other foods: Secondary | ICD-10-CM

## 2020-03-06 DIAGNOSIS — E876 Hypokalemia: Secondary | ICD-10-CM | POA: Diagnosis not present

## 2020-03-06 DIAGNOSIS — Z8719 Personal history of other diseases of the digestive system: Secondary | ICD-10-CM

## 2020-03-06 DIAGNOSIS — Z91041 Radiographic dye allergy status: Secondary | ICD-10-CM

## 2020-03-06 DIAGNOSIS — Z8262 Family history of osteoporosis: Secondary | ICD-10-CM

## 2020-03-06 DIAGNOSIS — Z888 Allergy status to other drugs, medicaments and biological substances status: Secondary | ICD-10-CM

## 2020-03-06 DIAGNOSIS — C189 Malignant neoplasm of colon, unspecified: Secondary | ICD-10-CM

## 2020-03-06 DIAGNOSIS — E875 Hyperkalemia: Secondary | ICD-10-CM | POA: Diagnosis present

## 2020-03-06 DIAGNOSIS — E785 Hyperlipidemia, unspecified: Secondary | ICD-10-CM | POA: Diagnosis not present

## 2020-03-06 DIAGNOSIS — R339 Retention of urine, unspecified: Secondary | ICD-10-CM | POA: Diagnosis present

## 2020-03-06 DIAGNOSIS — E861 Hypovolemia: Secondary | ICD-10-CM | POA: Diagnosis not present

## 2020-03-06 DIAGNOSIS — I700 Atherosclerosis of aorta: Secondary | ICD-10-CM | POA: Diagnosis not present

## 2020-03-06 DIAGNOSIS — Z7401 Bed confinement status: Secondary | ICD-10-CM | POA: Diagnosis not present

## 2020-03-06 DIAGNOSIS — R001 Bradycardia, unspecified: Secondary | ICD-10-CM | POA: Diagnosis not present

## 2020-03-06 DIAGNOSIS — Z881 Allergy status to other antibiotic agents status: Secondary | ICD-10-CM

## 2020-03-06 DIAGNOSIS — K219 Gastro-esophageal reflux disease without esophagitis: Secondary | ICD-10-CM | POA: Diagnosis present

## 2020-03-06 DIAGNOSIS — S82209D Unspecified fracture of shaft of unspecified tibia, subsequent encounter for closed fracture with routine healing: Secondary | ICD-10-CM | POA: Diagnosis not present

## 2020-03-06 DIAGNOSIS — Z885 Allergy status to narcotic agent status: Secondary | ICD-10-CM

## 2020-03-06 DIAGNOSIS — N736 Female pelvic peritoneal adhesions (postinfective): Secondary | ICD-10-CM | POA: Diagnosis present

## 2020-03-06 DIAGNOSIS — I10 Essential (primary) hypertension: Secondary | ICD-10-CM

## 2020-03-06 DIAGNOSIS — K580 Irritable bowel syndrome with diarrhea: Secondary | ICD-10-CM | POA: Diagnosis present

## 2020-03-06 DIAGNOSIS — D122 Benign neoplasm of ascending colon: Secondary | ICD-10-CM | POA: Diagnosis not present

## 2020-03-06 DIAGNOSIS — R531 Weakness: Secondary | ICD-10-CM | POA: Diagnosis not present

## 2020-03-06 DIAGNOSIS — M2550 Pain in unspecified joint: Secondary | ICD-10-CM | POA: Diagnosis not present

## 2020-03-06 DIAGNOSIS — K8689 Other specified diseases of pancreas: Secondary | ICD-10-CM | POA: Diagnosis not present

## 2020-03-06 DIAGNOSIS — K6389 Other specified diseases of intestine: Secondary | ICD-10-CM | POA: Diagnosis not present

## 2020-03-06 DIAGNOSIS — Z86011 Personal history of benign neoplasm of the brain: Secondary | ICD-10-CM

## 2020-03-06 DIAGNOSIS — K862 Cyst of pancreas: Secondary | ICD-10-CM | POA: Diagnosis not present

## 2020-03-06 DIAGNOSIS — G40909 Epilepsy, unspecified, not intractable, without status epilepticus: Secondary | ICD-10-CM

## 2020-03-06 DIAGNOSIS — R1084 Generalized abdominal pain: Secondary | ICD-10-CM | POA: Diagnosis not present

## 2020-03-06 DIAGNOSIS — Z853 Personal history of malignant neoplasm of breast: Secondary | ICD-10-CM

## 2020-03-06 DIAGNOSIS — R5381 Other malaise: Secondary | ICD-10-CM | POA: Diagnosis not present

## 2020-03-06 DIAGNOSIS — R188 Other ascites: Secondary | ICD-10-CM | POA: Diagnosis not present

## 2020-03-06 DIAGNOSIS — A419 Sepsis, unspecified organism: Secondary | ICD-10-CM | POA: Diagnosis not present

## 2020-03-06 DIAGNOSIS — R569 Unspecified convulsions: Secondary | ICD-10-CM

## 2020-03-06 DIAGNOSIS — R0902 Hypoxemia: Secondary | ICD-10-CM | POA: Diagnosis not present

## 2020-03-06 DIAGNOSIS — Z90710 Acquired absence of both cervix and uterus: Secondary | ICD-10-CM

## 2020-03-06 DIAGNOSIS — R2689 Other abnormalities of gait and mobility: Secondary | ICD-10-CM | POA: Diagnosis not present

## 2020-03-06 HISTORY — PX: LAPAROSCOPIC RIGHT HEMI COLECTOMY: SHX5926

## 2020-03-06 LAB — RESPIRATORY PANEL BY RT PCR (FLU A&B, COVID)
Influenza A by PCR: NEGATIVE
Influenza B by PCR: NEGATIVE
SARS Coronavirus 2 by RT PCR: NEGATIVE

## 2020-03-06 LAB — GLUCOSE, CAPILLARY
Glucose-Capillary: 176 mg/dL — ABNORMAL HIGH (ref 70–99)
Glucose-Capillary: 264 mg/dL — ABNORMAL HIGH (ref 70–99)
Glucose-Capillary: 193 mg/dL — ABNORMAL HIGH (ref 70–99)

## 2020-03-06 SURGERY — LAPAROSCOPIC RIGHT HEMI COLECTOMY
Anesthesia: General | Site: Abdomen | Laterality: Right

## 2020-03-06 MED ORDER — THIAMINE HCL 100 MG PO TABS
250.0000 mg | ORAL_TABLET | Freq: Every day | ORAL | Status: DC
  Administered 2020-03-06 – 2020-03-11 (×6): 250 mg via ORAL
  Filled 2020-03-06 (×6): qty 3

## 2020-03-06 MED ORDER — VITAMIN B-1 50 MG PO TABS
250.0000 mg | ORAL_TABLET | Freq: Every day | ORAL | Status: DC
  Filled 2020-03-06: qty 1

## 2020-03-06 MED ORDER — FENTANYL CITRATE (PF) 100 MCG/2ML IJ SOLN
INTRAMUSCULAR | Status: DC | PRN
  Administered 2020-03-06 (×4): 25 ug via INTRAVENOUS
  Administered 2020-03-06 (×2): 50 ug via INTRAVENOUS

## 2020-03-06 MED ORDER — TRANDOLAPRIL 2 MG PO TABS
2.0000 mg | ORAL_TABLET | Freq: Every day | ORAL | Status: DC
  Administered 2020-03-06: 2 mg via ORAL
  Filled 2020-03-06: qty 1

## 2020-03-06 MED ORDER — OXYCODONE HCL 5 MG/5ML PO SOLN: 5.0000 mg | Freq: Once | ORAL | Status: DC | PRN

## 2020-03-06 MED ORDER — BIOTIN 1000 MCG PO TABS: 1000.0000 ug | ORAL_TABLET | Freq: Every day | ORAL | Status: DC

## 2020-03-06 MED ORDER — FENTANYL CITRATE (PF) 100 MCG/2ML IJ SOLN
25.0000 ug | INTRAMUSCULAR | Status: DC | PRN
  Administered 2020-03-06 (×3): 25 ug via INTRAVENOUS

## 2020-03-06 MED ORDER — BISACODYL 5 MG PO TBEC: 20.0000 mg | DELAYED_RELEASE_TABLET | Freq: Once | ORAL | Status: DC

## 2020-03-06 MED ORDER — FENTANYL CITRATE (PF) 100 MCG/2ML IJ SOLN
INTRAMUSCULAR | Status: AC
  Filled 2020-03-06: qty 2

## 2020-03-06 MED ORDER — PROPOFOL 10 MG/ML IV BOLUS
INTRAVENOUS | Status: AC
  Filled 2020-03-06: qty 20

## 2020-03-06 MED ORDER — POLYVINYL ALCOHOL 1.4 % OP SOLN
1.0000 [drp] | Freq: Three times a day (TID) | OPHTHALMIC | Status: DC
  Administered 2020-03-06 – 2020-03-11 (×16): 1 [drp] via OPHTHALMIC
  Filled 2020-03-06: qty 15

## 2020-03-06 MED ORDER — INSULIN ASPART 100 UNIT/ML ~~LOC~~ SOLN
0.0000 [IU] | Freq: Every day | SUBCUTANEOUS | Status: DC
  Administered 2020-03-10: 3 [IU] via SUBCUTANEOUS
  Administered 2020-03-11: 2 [IU] via SUBCUTANEOUS

## 2020-03-06 MED ORDER — PRIMIDONE 50 MG PO TABS
100.0000 mg | ORAL_TABLET | Freq: Every day | ORAL | Status: DC
  Administered 2020-03-06 – 2020-03-11 (×6): 100 mg via ORAL
  Filled 2020-03-06 (×6): qty 2

## 2020-03-06 MED ORDER — SODIUM CHLORIDE (PF) 0.9 % IJ SOLN
INTRAMUSCULAR | Status: AC
  Filled 2020-03-06: qty 20

## 2020-03-06 MED ORDER — ONDANSETRON HCL 4 MG PO TABS
4.0000 mg | ORAL_TABLET | Freq: Four times a day (QID) | ORAL | Status: DC | PRN
  Administered 2020-03-10: 4 mg via ORAL
  Filled 2020-03-06 (×2): qty 1

## 2020-03-06 MED ORDER — VERAPAMIL HCL ER 240 MG PO TBCR
240.0000 mg | EXTENDED_RELEASE_TABLET | Freq: Every day | ORAL | Status: DC
  Administered 2020-03-06: 240 mg via ORAL
  Filled 2020-03-06: qty 1

## 2020-03-06 MED ORDER — ROCURONIUM BROMIDE 10 MG/ML (PF) SYRINGE
PREFILLED_SYRINGE | INTRAVENOUS | Status: AC
  Filled 2020-03-06: qty 10

## 2020-03-06 MED ORDER — BUPIVACAINE LIPOSOME 1.3 % IJ SUSP
INTRAMUSCULAR | Status: DC | PRN
  Administered 2020-03-06: 20 mL

## 2020-03-06 MED ORDER — CHLORHEXIDINE GLUCONATE CLOTH 2 % EX PADS: 6.0000 | MEDICATED_PAD | Freq: Once | CUTANEOUS | Status: DC

## 2020-03-06 MED ORDER — HEPARIN SODIUM (PORCINE) 5000 UNIT/ML IJ SOLN
5000.0000 [IU] | Freq: Three times a day (TID) | INTRAMUSCULAR | Status: DC
  Administered 2020-03-06 – 2020-03-07 (×2): 5000 [IU] via SUBCUTANEOUS
  Filled 2020-03-06 (×2): qty 1

## 2020-03-06 MED ORDER — CHOLESTYRAMINE LIGHT 4 G PO PACK
0.5000 | PACK | Freq: Every day | ORAL | Status: DC
  Administered 2020-03-06 – 2020-03-10 (×5): 0.5 via ORAL
  Filled 2020-03-06 (×7): qty 1

## 2020-03-06 MED ORDER — POLYETHYLENE GLYCOL 3350 17 GM/SCOOP PO POWD: 1.0000 | Freq: Once | ORAL | Status: DC

## 2020-03-06 MED ORDER — ONDANSETRON HCL 4 MG/2ML IJ SOLN
4.0000 mg | Freq: Four times a day (QID) | INTRAMUSCULAR | Status: DC | PRN
  Administered 2020-03-06 – 2020-03-09 (×7): 4 mg via INTRAVENOUS
  Filled 2020-03-06 (×7): qty 2

## 2020-03-06 MED ORDER — PHENYTOIN SODIUM EXTENDED 100 MG PO CAPS
200.0000 mg | ORAL_CAPSULE | Freq: Every day | ORAL | Status: DC
  Administered 2020-03-06 – 2020-03-11 (×6): 200 mg via ORAL
  Filled 2020-03-06 (×6): qty 2

## 2020-03-06 MED ORDER — PHENYTOIN SODIUM EXTENDED 100 MG PO CAPS
100.0000 mg | ORAL_CAPSULE | Freq: Every day | ORAL | Status: DC
  Administered 2020-03-07 – 2020-03-12 (×6): 100 mg via ORAL
  Filled 2020-03-06 (×6): qty 1

## 2020-03-06 MED ORDER — LACTATED RINGERS IV SOLN: INTRAVENOUS | Status: DC

## 2020-03-06 MED ORDER — TRANDOLAPRIL-VERAPAMIL HCL ER 2-240 MG PO TBCR: 1.0000 | EXTENDED_RELEASE_TABLET | Freq: Every day | ORAL | Status: DC

## 2020-03-06 MED ORDER — PROPOFOL 10 MG/ML IV BOLUS
INTRAVENOUS | Status: DC | PRN
  Administered 2020-03-06: 20 mg via INTRAVENOUS
  Administered 2020-03-06: 30 mg via INTRAVENOUS
  Administered 2020-03-06: 25 mg via INTRAVENOUS
  Administered 2020-03-06: 30 mg via INTRAVENOUS
  Administered 2020-03-06: 20 mg via INTRAVENOUS
  Administered 2020-03-06: 25 mg via INTRAVENOUS
  Administered 2020-03-06: 100 mg via INTRAVENOUS

## 2020-03-06 MED ORDER — ENSURE PRE-SURGERY PO LIQD
592.0000 mL | Freq: Once | ORAL | Status: DC
  Filled 2020-03-06: qty 592

## 2020-03-06 MED ORDER — ENSURE PRE-SURGERY PO LIQD
296.0000 mL | Freq: Once | ORAL | Status: DC
  Filled 2020-03-06: qty 296

## 2020-03-06 MED ORDER — NEOMYCIN SULFATE 500 MG PO TABS: 1000.0000 mg | ORAL_TABLET | ORAL | Status: DC

## 2020-03-06 MED ORDER — ORAL CARE MOUTH RINSE: 15.0000 mL | Freq: Once | OROMUCOSAL | Status: AC

## 2020-03-06 MED ORDER — ROCURONIUM BROMIDE 100 MG/10ML IV SOLN
INTRAVENOUS | Status: DC | PRN
  Administered 2020-03-06: 10 mg via INTRAVENOUS
  Administered 2020-03-06: 20 mg via INTRAVENOUS
  Administered 2020-03-06 (×2): 10 mg via INTRAVENOUS
  Administered 2020-03-06: 40 mg via INTRAVENOUS
  Administered 2020-03-06: 10 mg via INTRAVENOUS

## 2020-03-06 MED ORDER — PHENYTOIN 50 MG PO CHEW
50.0000 mg | CHEWABLE_TABLET | Freq: Every day | ORAL | Status: DC
  Administered 2020-03-07 – 2020-03-12 (×6): 50 mg via ORAL
  Filled 2020-03-06 (×6): qty 1

## 2020-03-06 MED ORDER — CHLORHEXIDINE GLUCONATE 0.12 % MT SOLN
15.0000 mL | Freq: Once | OROMUCOSAL | Status: AC
  Administered 2020-03-06: 15 mL via OROMUCOSAL

## 2020-03-06 MED ORDER — BUPIVACAINE-EPINEPHRINE (PF) 0.5% -1:200000 IJ SOLN
INTRAMUSCULAR | Status: AC
  Filled 2020-03-06: qty 30

## 2020-03-06 MED ORDER — ONDANSETRON HCL 4 MG/2ML IJ SOLN: 4.0000 mg | Freq: Once | INTRAMUSCULAR | Status: DC | PRN

## 2020-03-06 MED ORDER — ACETAMINOPHEN 500 MG PO TABS
1000.0000 mg | ORAL_TABLET | Freq: Four times a day (QID) | ORAL | Status: DC
  Administered 2020-03-06 – 2020-03-12 (×19): 1000 mg via ORAL
  Filled 2020-03-06 (×20): qty 2

## 2020-03-06 MED ORDER — LIDOCAINE 2% (20 MG/ML) 5 ML SYRINGE
INTRAMUSCULAR | Status: DC | PRN
  Administered 2020-03-06: 60 mg via INTRAVENOUS

## 2020-03-06 MED ORDER — FENTANYL CITRATE (PF) 100 MCG/2ML IJ SOLN
INTRAMUSCULAR | Status: AC
  Administered 2020-03-06: 25 ug via INTRAVENOUS
  Filled 2020-03-06: qty 2

## 2020-03-06 MED ORDER — EPHEDRINE SULFATE-NACL 50-0.9 MG/10ML-% IV SOSY
PREFILLED_SYRINGE | INTRAVENOUS | Status: DC | PRN
  Administered 2020-03-06 (×2): 5 mg via INTRAVENOUS

## 2020-03-06 MED ORDER — SODIUM CHLORIDE (PF) 0.9 % IJ SOLN
INTRAMUSCULAR | Status: DC | PRN
  Administered 2020-03-06: 15 mL

## 2020-03-06 MED ORDER — PHENYLEPHRINE HCL (PRESSORS) 10 MG/ML IV SOLN
INTRAVENOUS | Status: AC
  Filled 2020-03-06: qty 1

## 2020-03-06 MED ORDER — LACTATED RINGERS IV SOLN
INTRAVENOUS | Status: DC
  Administered 2020-03-06: 75 mL/h via INTRAVENOUS

## 2020-03-06 MED ORDER — PRIMIDONE 50 MG PO TABS
150.0000 mg | ORAL_TABLET | Freq: Every morning | ORAL | Status: DC
  Administered 2020-03-08 – 2020-03-12 (×5): 150 mg via ORAL
  Filled 2020-03-06 (×6): qty 3

## 2020-03-06 MED ORDER — ACETAMINOPHEN 500 MG PO TABS
1000.0000 mg | ORAL_TABLET | ORAL | Status: AC
  Administered 2020-03-06: 1000 mg via ORAL
  Filled 2020-03-06: qty 2

## 2020-03-06 MED ORDER — HYDROMORPHONE HCL 1 MG/ML IJ SOLN
0.5000 mg | INTRAMUSCULAR | Status: DC | PRN
  Administered 2020-03-06 – 2020-03-11 (×12): 0.5 mg via INTRAVENOUS
  Filled 2020-03-06 (×12): qty 0.5

## 2020-03-06 MED ORDER — LABETALOL HCL 5 MG/ML IV SOLN
INTRAVENOUS | Status: DC | PRN
  Administered 2020-03-06: 5 mg via INTRAVENOUS

## 2020-03-06 MED ORDER — SIMETHICONE 80 MG PO CHEW: 40.0000 mg | CHEWABLE_TABLET | Freq: Four times a day (QID) | ORAL | Status: DC | PRN

## 2020-03-06 MED ORDER — SUGAMMADEX SODIUM 200 MG/2ML IV SOLN
INTRAVENOUS | Status: DC | PRN
  Administered 2020-03-06: 125 mg via INTRAVENOUS

## 2020-03-06 MED ORDER — TRAMADOL HCL 50 MG PO TABS
50.0000 mg | ORAL_TABLET | Freq: Four times a day (QID) | ORAL | Status: DC | PRN
  Administered 2020-03-08: 50 mg via ORAL
  Filled 2020-03-06 (×2): qty 1

## 2020-03-06 MED ORDER — ONDANSETRON HCL 4 MG/2ML IJ SOLN
INTRAMUSCULAR | Status: DC | PRN
  Administered 2020-03-06: 4 mg via INTRAVENOUS

## 2020-03-06 MED ORDER — LABETALOL HCL 5 MG/ML IV SOLN
INTRAVENOUS | Status: AC
  Filled 2020-03-06: qty 4

## 2020-03-06 MED ORDER — ALBUMIN HUMAN 5 % IV SOLN
INTRAVENOUS | Status: AC
  Filled 2020-03-06: qty 250

## 2020-03-06 MED ORDER — ALVIMOPAN 12 MG PO CAPS
12.0000 mg | ORAL_CAPSULE | ORAL | Status: AC
  Administered 2020-03-06: 12 mg via ORAL
  Filled 2020-03-06: qty 1

## 2020-03-06 MED ORDER — ESCITALOPRAM OXALATE 10 MG PO TABS
10.0000 mg | ORAL_TABLET | Freq: Every day | ORAL | Status: DC
  Administered 2020-03-07 – 2020-03-12 (×6): 10 mg via ORAL
  Filled 2020-03-06 (×6): qty 1

## 2020-03-06 MED ORDER — DIPHENHYDRAMINE HCL 25 MG PO TABS
25.0000 mg | ORAL_TABLET | Freq: Four times a day (QID) | ORAL | Status: DC | PRN
  Administered 2020-03-10: 25 mg via ORAL
  Filled 2020-03-06: qty 2
  Filled 2020-03-06: qty 1
  Filled 2020-03-06: qty 2

## 2020-03-06 MED ORDER — HEPARIN SODIUM (PORCINE) 5000 UNIT/ML IJ SOLN
5000.0000 [IU] | Freq: Once | INTRAMUSCULAR | Status: AC
  Administered 2020-03-06: 5000 [IU] via SUBCUTANEOUS
  Filled 2020-03-06: qty 1

## 2020-03-06 MED ORDER — ENSURE SURGERY PO LIQD
237.0000 mL | Freq: Two times a day (BID) | ORAL | Status: DC
  Administered 2020-03-08 – 2020-03-09 (×3): 237 mL via ORAL
  Filled 2020-03-06 (×6): qty 237

## 2020-03-06 MED ORDER — BUPIVACAINE-EPINEPHRINE 0.5% -1:200000 IJ SOLN
INTRAMUSCULAR | Status: DC | PRN
  Administered 2020-03-06: 15 mL

## 2020-03-06 MED ORDER — LIDOCAINE 2% (20 MG/ML) 5 ML SYRINGE
INTRAMUSCULAR | Status: AC
  Filled 2020-03-06: qty 5

## 2020-03-06 MED ORDER — POLYETHYL GLYCOL-PROPYL GLYCOL 0.4-0.3 % OP GEL
Freq: Three times a day (TID) | OPHTHALMIC | Status: DC
  Filled 2020-03-06: qty 10

## 2020-03-06 MED ORDER — OXYCODONE HCL 5 MG PO TABS: 5.0000 mg | ORAL_TABLET | Freq: Once | ORAL | Status: DC | PRN

## 2020-03-06 MED ORDER — AMISULPRIDE (ANTIEMETIC) 5 MG/2ML IV SOLN: 10.0000 mg | Freq: Once | INTRAVENOUS | Status: DC | PRN

## 2020-03-06 MED ORDER — ALBUMIN HUMAN 5 % IV SOLN: INTRAVENOUS | Status: DC | PRN

## 2020-03-06 MED ORDER — ONDANSETRON HCL 4 MG/2ML IJ SOLN
INTRAMUSCULAR | Status: AC
  Filled 2020-03-06: qty 2

## 2020-03-06 MED ORDER — PHENYLEPHRINE 40 MCG/ML (10ML) SYRINGE FOR IV PUSH (FOR BLOOD PRESSURE SUPPORT)
PREFILLED_SYRINGE | INTRAVENOUS | Status: DC | PRN
  Administered 2020-03-06: 80 ug via INTRAVENOUS

## 2020-03-06 MED ORDER — ALVIMOPAN 12 MG PO CAPS
12.0000 mg | ORAL_CAPSULE | Freq: Two times a day (BID) | ORAL | Status: DC
  Administered 2020-03-07 (×2): 12 mg via ORAL
  Filled 2020-03-06 (×2): qty 1

## 2020-03-06 MED ORDER — ALPRAZOLAM 0.25 MG PO TABS
0.2500 mg | ORAL_TABLET | Freq: Every evening | ORAL | Status: DC | PRN
  Administered 2020-03-08 – 2020-03-11 (×3): 0.25 mg via ORAL
  Filled 2020-03-06 (×4): qty 1

## 2020-03-06 MED ORDER — ALUM & MAG HYDROXIDE-SIMETH 200-200-20 MG/5ML PO SUSP: 30.0000 mL | Freq: Four times a day (QID) | ORAL | Status: DC | PRN

## 2020-03-06 MED ORDER — INSULIN ASPART 100 UNIT/ML ~~LOC~~ SOLN
0.0000 [IU] | Freq: Three times a day (TID) | SUBCUTANEOUS | Status: DC
  Administered 2020-03-06: 5 [IU] via SUBCUTANEOUS
  Administered 2020-03-07: 2 [IU] via SUBCUTANEOUS
  Administered 2020-03-07: 3 [IU] via SUBCUTANEOUS
  Administered 2020-03-07: 2 [IU] via SUBCUTANEOUS
  Administered 2020-03-09: 5 [IU] via SUBCUTANEOUS
  Administered 2020-03-10: 7 [IU] via SUBCUTANEOUS
  Administered 2020-03-11: 3 [IU] via SUBCUTANEOUS
  Administered 2020-03-11: 1 [IU] via SUBCUTANEOUS

## 2020-03-06 MED ORDER — CLINDAMYCIN PHOSPHATE 900 MG/50ML IV SOLN
INTRAVENOUS | Status: AC
  Filled 2020-03-06: qty 50

## 2020-03-06 MED ORDER — LACTATED RINGERS IR SOLN
Status: DC | PRN
  Administered 2020-03-06: 1000 mL

## 2020-03-06 MED ORDER — METRONIDAZOLE 500 MG PO TABS: 1000.0000 mg | ORAL_TABLET | ORAL | Status: DC

## 2020-03-06 MED ORDER — LEVOTHYROXINE SODIUM 75 MCG PO TABS
75.0000 ug | ORAL_TABLET | Freq: Every day | ORAL | Status: DC
  Administered 2020-03-07 – 2020-03-12 (×6): 75 ug via ORAL
  Filled 2020-03-06 (×6): qty 1

## 2020-03-06 MED ORDER — 0.9 % SODIUM CHLORIDE (POUR BTL) OPTIME
TOPICAL | Status: DC | PRN
  Administered 2020-03-06: 2000 mL

## 2020-03-06 SURGICAL SUPPLY — 61 items
APL PRP STRL LF DISP 70% ISPRP (MISCELLANEOUS) ×1
APPLIER CLIP ROT 10 11.4 M/L (STAPLE)
APR CLP MED LRG 11.4X10 (STAPLE)
BLADE CLIPPER SURG (BLADE) IMPLANT
CABLE HIGH FREQUENCY MONO STRZ (ELECTRODE) ×4 IMPLANT
CELLS DAT CNTRL 66122 CELL SVR (MISCELLANEOUS) IMPLANT
CHLORAPREP W/TINT 26 (MISCELLANEOUS) ×2 IMPLANT
CLIP APPLIE ROT 10 11.4 M/L (STAPLE) IMPLANT
COVER WAND RF STERILE (DRAPES) IMPLANT
DECANTER SPIKE VIAL GLASS SM (MISCELLANEOUS) ×2 IMPLANT
DISSECTOR BLUNT TIP ENDO 5MM (MISCELLANEOUS) IMPLANT
DRSG OPSITE POSTOP 4X6 (GAUZE/BANDAGES/DRESSINGS) ×1 IMPLANT
ELECT REM PT RETURN 15FT ADLT (MISCELLANEOUS) ×2 IMPLANT
GAUZE SPONGE 4X4 12PLY STRL (GAUZE/BANDAGES/DRESSINGS) ×2 IMPLANT
GLOVE BIO SURGEON STRL SZ7.5 (GLOVE) ×2 IMPLANT
GLOVE ECLIPSE 8.0 STRL XLNG CF (GLOVE) ×2 IMPLANT
GOWN STRL REUS W/TWL XL LVL3 (GOWN DISPOSABLE) ×8 IMPLANT
KIT TURNOVER KIT A (KITS) ×1 IMPLANT
LIGASURE IMPACT 36 18CM CVD LR (INSTRUMENTS) IMPLANT
NS IRRIG 1000ML POUR BTL (IV SOLUTION) ×2 IMPLANT
PACK COLON (CUSTOM PROCEDURE TRAY) ×2 IMPLANT
PAD POSITIONING PINK XL (MISCELLANEOUS) ×1 IMPLANT
PENCIL SMOKE EVACUATOR (MISCELLANEOUS) IMPLANT
PROTECTOR NERVE ULNAR (MISCELLANEOUS) IMPLANT
RELOAD PROXIMATE 75MM BLUE (ENDOMECHANICALS) ×2 IMPLANT
RELOAD STAPLE 75 3.8 BLU REG (ENDOMECHANICALS) IMPLANT
RETRACTOR WND ALEXIS 18 MED (MISCELLANEOUS) IMPLANT
RTRCTR WOUND ALEXIS 18CM MED (MISCELLANEOUS)
SCISSORS LAP 5X35 DISP (ENDOMECHANICALS) ×2 IMPLANT
SEALER TISSUE G2 STRG ARTC 35C (ENDOMECHANICALS) IMPLANT
SET IRRIG TUBING LAPAROSCOPIC (IRRIGATION / IRRIGATOR) IMPLANT
SET TUBE SMOKE EVAC HIGH FLOW (TUBING) ×2 IMPLANT
SHEARS HARMONIC ACE PLUS 36CM (ENDOMECHANICALS) IMPLANT
SLEEVE ADV FIXATION 5X100MM (TROCAR) ×4 IMPLANT
SLEEVE ENDOPATH XCEL 5M (ENDOMECHANICALS) ×2 IMPLANT
STAPLER GUN LINEAR PROX 60 (STAPLE) ×1 IMPLANT
STAPLER PROXIMATE 75MM BLUE (STAPLE) ×2 IMPLANT
STAPLER VISISTAT 35W (STAPLE) ×2 IMPLANT
SUT MNCRL AB 4-0 PS2 18 (SUTURE) ×2 IMPLANT
SUT PDS AB 1 CT1 27 (SUTURE) ×4 IMPLANT
SUT PROLENE 2 0 CT2 30 (SUTURE) IMPLANT
SUT PROLENE 2 0 KS (SUTURE) IMPLANT
SUT SILK 2 0 (SUTURE) ×2
SUT SILK 2 0 SH CR/8 (SUTURE) ×2 IMPLANT
SUT SILK 2-0 18XBRD TIE 12 (SUTURE) ×1 IMPLANT
SUT SILK 3 0 (SUTURE) ×2
SUT SILK 3 0 SH CR/8 (SUTURE) ×2 IMPLANT
SUT SILK 3-0 18XBRD TIE 12 (SUTURE) ×1 IMPLANT
SYS LAPSCP GELPORT 120MM (MISCELLANEOUS)
SYSTEM LAPSCP GELPORT 120MM (MISCELLANEOUS) IMPLANT
TAPE CLOTH 4X10 WHT NS (GAUZE/BANDAGES/DRESSINGS) IMPLANT
TOWEL OR 17X26 10 PK STRL BLUE (TOWEL DISPOSABLE) ×2 IMPLANT
TRAY FOL W/BAG SLVR 16FR STRL (SET/KITS/TRAYS/PACK) IMPLANT
TRAY FOLEY MTR SLVR 14FR STAT (SET/KITS/TRAYS/PACK) ×1 IMPLANT
TRAY FOLEY MTR SLVR 16FR STAT (SET/KITS/TRAYS/PACK) IMPLANT
TRAY FOLEY W/BAG SLVR 16FR LF (SET/KITS/TRAYS/PACK) ×2
TROCAR ADV FIXATION 5X100MM (TROCAR) ×2 IMPLANT
TROCAR BALLN 12MMX100 BLUNT (TROCAR) ×2 IMPLANT
TROCAR XCEL NON-BLD 11X100MML (ENDOMECHANICALS) IMPLANT
TUBING CONNECTING 10 (TUBING) ×4 IMPLANT
YANKAUER SUCT BULB TIP NO VENT (SUCTIONS) ×2 IMPLANT

## 2020-03-06 NOTE — Anesthesia Postprocedure Evaluation (Signed)
Anesthesia Post Note  Patient: [name]  Procedure(s) Performed: LAPAROSCOPIC RIGHT HEMI COLECTOMY WITH TAP BLOCK AND LYSIS OF ADHESIONS (Right Abdomen)     Patient location during evaluation: PACU Anesthesia Type: General Level of consciousness: awake and alert Pain management: pain level controlled Vital Signs Assessment: post-procedure vital signs reviewed and stable Respiratory status: spontaneous breathing, nonlabored ventilation, respiratory function stable and patient connected to nasal cannula oxygen Cardiovascular status: blood pressure returned to baseline and stable Postop Assessment: no apparent nausea or vomiting Anesthetic complications: no   No complications documented.  Last Vitals:  Vitals:   03/06/20 1415 03/06/20 1430  BP: 123/67 129/69  Pulse: 81 82  Resp: 14 13  Temp:    SpO2: 97% 100%    Last Pain:  Vitals:   03/06/20 1430  TempSrc:   PainSc: [place]

## 2020-03-06 NOTE — Op Note (Signed)
PATIENT: [name]  86 y.o. female  Patient Care Team: Bartholome Bill, MD as PCP - General (Family Medicine)  PREOP DIAGNOSIS: Colon cancer  POSTOP DIAGNOSIS: Same  PROCEDURE:  1. Laparoscopic right hemicolectomy 2. Laparoscopic lysis of adhesions x 120 minutes 3. Bilateral transversus abdominus plane blocks  SURGEON: Sharon Mt. Dema Severin, MD  ASSISTANT: Sheria Lang, MD  ANESTHESIA: General endotracheal  EBL: 50 mL Total I/O In: 407 [IV [id number]] Out: 42 [Urine:500; Blood:50]  DRAINS: None  SPECIMEN: Right colon  COUNTS: Sponge, needle and instrument counts were reported correct x2  FINDINGS:  No obvious metastatic disease on visceral parietal peritoneum or liver. Mass in proximal ascending colon. Significant adhesions of small bowel to small bowel as well as mesentery, tethered in pelvis from prior surgery. Right hemicolectomy carried out - small polyp in cecum, large mass in proximal ascending colon.  NARRATIVE:  The patient was identified & brought into the operating room, placed supine on the operating table and SCDs were applied to the lower extremities. General endotracheal anesthesia was induced. The patient was positioned supine with arms tucked. Antibiotics were administered. A foley catheter was placed under sterile conditions. Hair in the region of planned surgery was clipped. The abdomen was prepped and draped in a sterile fashion. A timeout was performed confirming our patient and plan.   Beginning with the extraction port, a supraumbilical incision was made and carried down to the midline fascia. This was then incised with electrocautery. The peritoneum was identified and elevated between clamps and carefully opened sharply. A small Alexis wound protector with a cap and associated port was then placed. The abdomen was insufflated to 15 mmHg with CO2. A laparoscope was placed and camera inspection revealed no evidence of injury. Significant adhesions  were noted in the lower abdomen related to her prior procedures.  There was a window in the left hemiabdomen where I was able to place a 5 mm trocar under direct visualization.  Using scissors, the adhesions along the midline were carefully taken down sharply.  We were able to clear additional space along the left lower abdominal wall.  I was able to then place another 5 mm trocar under direct visualization.  Adhesiolysis then carried out laparoscopically taking down the small bowel from the abdominal wall and the terminal ileum was identified.  There was significant adhesions into her pelvis involving her distal ileum.  These had to be mobilized in order to facilitate the procedure.  This was all done carefully and sharply.  The small bowel was then inspected and noted to be free of any injury.  Omentum was also lysed to free it from the abdominal wall.  An additional 5 mm trocar was unable to be placed in the right hemiabdomen.  Bilateral TAP blocks were then performed under laparoscopic visualization using a mixture of 0.25% marcaine with epinepherine + Exparel. The abdomen was surveyed. The liver and peritoneum appeared normal.  There were no signs of metastatic disease.  Evident mass in the proximal ascending colon as expected.  There is no evident extension beyond the colon.  There were a fair amount of adhesions of small bowel to the medial side of her colon mesentery and therefore a lateral to medial approach was selected.  The cecum and ascending colon were then mobilized by incising the Donye Campanelli line of [place].  The hepatic flexure was partially taken down from this approach.  Following this, she was repositioned in reverse Trendelenburg.  Beginning on the proximal third  of the transverse colon, the omentum was incised and the lesser sac gained.  The gastrocolic ligament was taken down.  The hepatic flexure was fully mobilized.  The duodenum was identified and protected free of injury.  She had a lot of  small bowel adherent to the ascending mesocolon.  There was plenty of length for the small bowel to reach into her left upper quadrant.  At this point, the remainder of the procedure is going to be carried out through our extraction incision given the adhesions to her mesentery.  At this point, the abdomen was desufflated and the terminal ileum and right colon delivered through the wound protector.  Small bowel adhesions were then taken off of the a sending mesocolon.  After freeing all of these sharply, the small bowel was inspected and noted to be free of any injury. The terminal ileum was then transected using a GIA blue load stapler. A Babcock was placed on the ileum to maintain orientation. The ileocolic pedicle was identified.  The duodenum was inspected and noted to be well away from this.  This was then divided using the Enseal.  The pedicle was inspected and noted to be hemostatic. The distal point of transection was then identified on the transverse colon at a location that included the right branch of the middle colics, leaving the main middle colic feeding the remaining transverse colon. This was transected using another blue load GIA stapler.  The specimen was then passed off. Attention was turned to creating the anastomosis. The terminal ileum and transverse colon were inspected for orientation to ensure no twisting nor bowel included in the mesenteric defect. An anastomosis was created between the terminal ileum and the transverse colon using a 75 mm GIA blue load stapler. The staple line was inspected and noted to be hemostatic.  The common enterotomy channel was closed using a TA 60 blue load stapler. Hemostasis was achieved at the staple line using 3-0 silk U-stitches. 3-0 silk sutures were used to imbricate the corners of the staple line as well.  A 3-0 silk suture was placed securing the "crotch" of the anastomosis. The anastomosis was palpated and noted to be widely patent. This was then  placed back into the abdomen. The abdomen was then irrigated with sterile saline and hemostasis verified. The omentum was then brought down over the anastomosis. The laparoscopic ports were removed under direct visualization and the sites noted to be hemostatic. The Alexis wound protector was removed, counts were reported correct, and we switched to clean instruments, gowns and drapes.   The fascia was then closed using two running #1 PDS sutures.  The skin of all incision sites was closed with 4-0 monocryl subcuticular suture. Dermabond was placed on the port sites and a sterile dressing was placed over the abdominal incision. All counts were reported correct x2. The patient was then awakened from anesthesia and sent to the post anesthesia care unit in stable condition.    DISPOSITION: PACU in satisfactory condition

## 2020-03-06 NOTE — Consult Note (Signed)
Medical Consultation  [name] RAQ:[id number] DOB: [birth year] DOA: 03/06/2020 PCP: Bartholome Bill, MD   Requesting physician: Dr. Nadeen Landau Date of consultation: 03/06/20 Reason for consultation: Med mgmt  Impression/Recommendations Colon CA s/p right hemicolectomy     - mgmt per primary team  DM2     - on lantus 14 units HS at home     - SSI, starting on CLD per surgery, glucose checks     - A1c 8.6 (03/04/20)  Hypothyroidism     - continue levothyroxine 36mg AM  Anxiety     - continue lexapro, xanax  HTN     - continue tarka  HLD     - per chart review, this is diet controlled.   Hx of brain tumor s/p surgery Essential tremor     - [place] [facility]    - on mysoline 1546mAM, 5016mS; also on dilantin 150m47m, 200mg53m Recent fall w/ distal tibia fracture     - followed by Dr. HewitDoran Durand- initially had cast, but now in boot and has been cleared for ambulation     - continue rehab  Hyponatremia     - mild, let's get her some IVF w/ NS; follow labs  Microcytic anemia     - appears to be at her recent baseline     - check iron labs  TRH will follow-up again tomorrow. Please contact me if I can be of assistance in the meanwhile. Thank you for this consultation.  Chief Complaint: Colon CA  HPI:  Yolanda SJullisa Grigoryanis a 86 y.40 female with medical history significant of DM2, brain tumor s/p surgery, seizure/tremor, HTN, HLD, colon CA. Hx from chart review as patient is extremely groggy s/p anesthesia. She can answer short answers only right now. Was seen earlier this year by GI for several months of diarrhea and unexplained anemia. She completed EGD and c-scope. She was found to have multiple polyps and haustrations. Bx were obtained that should invasive adenocarcinoma. She presents today for right hemicolectomy w/ CCS. She has successfully completed this procedure. She has extensive comorbidity. TRH has been asked to assisted in medical management.    Review  of Systems:  Unable to obtain d/t mentation.   Past Medical History:  Diagnosis Date  . Anemia   . Anxiety   . Asthma   . Breast cancer (HCC) [place]   right breast  . Cancer of right breast (HCC) [place]5/14   right breast 12:00 o'clock, DCIS  . Carotid artery disease (HCC) [place] Carpal tunnel syndrome, bilateral   . Chronic bronchitis (HCC) [place] Chronic cough   . Chronic facial pain   . Chronic foot pain   . Colon cancer (HCC) [place] 11/2019  . Complication of anesthesia    Sore jaw; could not chew or move mouth, prolonged sedation  . Convulsions/seizures (HCC) [place]4/2016  . Diabetes mellitus    type 2 niddm x 20 years  . Dyslipidemia   . Ejection fraction   . Gait abnormality 12/04/2019  . GERD (gastroesophageal reflux disease)   . Hammer toe    bilateral  . History of colonic polyps   . History of meningioma   . HTN (hypertension)   . Hx of radiation therapy 03/07/13- 03/29/13   right breast 4250 cGy 17 sessions  . Hyperlipidemia   . Hypokalemia   . Hyponatremia   . Hypothyroidism   . IBS (  irritable bowel syndrome)   . Melanoma ([place])   . Metatarsal bone fracture 2014  . Multiple drug allergies   . Nontoxic thyroid nodule   . Obesity   . Osteoarthritis   . Osteoporosis   . Palpitations   . Personal history of radiation therapy 2014  . Seasonal allergies   . Skin cancer   . Syncope   . Tremor, essential 08/18/2016  . Vitamin B12 deficiency   . Vitamin D deficiency    Past Surgical History:  Procedure Laterality Date  . ABDOMINAL HYSTERECTOMY    . BIOPSY  11/07/2019   Procedure: BIOPSY;  Surgeon: Ronald Lobo, MD;  Location: [facility];  Service: Endoscopy;;  . BRAIN SURGERY    . BREAST BIOPSY Right 01/24/2013   Procedure: RE-EXCICION OF BREAST CANCER, ANTERIOR MARGINS;  Surgeon: Edward Jolly, MD;  Location: [facility];  Service: General;  Laterality: Right;  . BREAST LUMPECTOMY Right 2014  . BREAST LUMPECTOMY WITH NEEDLE LOCALIZATION Right 01/17/2013   Procedure:  BREAST LUMPECTOMY WITH NEEDLE LOCALIZATION;  Surgeon: Edward Jolly, MD;  Location: [facility];  Service: General;  Laterality: Right;  . BUNIONECTOMY Bilateral   . CATARACT EXTRACTION W/ INTRAOCULAR LENS IMPLANT Right   . CHOLECYSTECTOMY    . COLONOSCOPY    . COLONOSCOPY WITH PROPOFOL N/A 11/07/2019   Procedure: COLONOSCOPY WITH PROPOFOL;  Surgeon: Ronald Lobo, MD;  Location: [facility];  Service: Endoscopy;  Laterality: N/A;  . CRANIOTOMY Right 10/18/2013   Procedure: CRANIOTOMY TUMOR EXCISION;  Surgeon: Floyce Stakes, MD;  Location: [facility];  Service: Neurosurgery;  Laterality: Right;  . ESOPHAGOGASTRODUODENOSCOPY (EGD) WITH PROPOFOL N/A 11/07/2019   Procedure: ESOPHAGOGASTRODUODENOSCOPY (EGD) WITH PROPOFOL;  Surgeon: Ronald Lobo, MD;  Location: [facility];  Service: Endoscopy;  Laterality: N/A;  . EYE SURGERY    . HERNIA REPAIR    . KNEE ARTHROSCOPY Bilateral   . POLYPECTOMY     small adenomatous  . ULNAR TUNNEL RELEASE     Social History:  reports that she has never smoked. She has never used smokeless tobacco. She reports that she does not drink alcohol and does not use drugs.  Allergies  Allergen Reactions  . Bystolic [Nebivolol Hcl] Other (See Comments)    "extreme weakness, heaviness in legs & arms, swelling in legs/arms/face, swollen abdomen, pain in bladder, feet pain, soreness in chest"  . Cholestyramine     "itching rash on stomach, bloated, nausea, vomiting, sleeplessness, extreme pain in arms"  . Hydrazine Yellow [Tartrazine] Other (See Comments)    "does not reduce high blood pressure, pain in arm, high pressure, felt like I was on verge of heart attack, really weak"  . Morphine Other (See Comments)    Feels morbid, weak, still in pain  . Niacin Palpitations    Fast heart beat  . Niaspan [Niacin Er] Palpitations and Other (See Comments)    "fast heart beat, high blood pressure"  . Norvasc [Amlodipine Besylate] Other (See Comments)    "extreme fluid  retention/pain)  . Optivar [Azelastine Hcl] Photosensitivity  . Repaglinide Hives  . Sular [Nisoldipine Er] Other (See Comments)    "severe headaches, swelling eyes, hands, feet, shortness of breath, weak, flushed face, brain boiling, fluid retention, high blood sugar, nervous, heart fast beating"  . Tegretol [Carbamazepine] Other (See Comments)    Blood poisoning   . Telmisartan Other (See Comments)    "headache, difficulty urinating, high blood sugar, fluid retention"  . Amoxicillin-Pot Clavulanate Rash  . Cefdinir Swelling  Vaginal irritation, breathing,   . Ciprofloxacin Hcl Hives  . Clonidine Other (See Comments)    Dry mouth, fluid retention  . Clonidine Hydrochloride Other (See Comments)    Dry mouth, fluid retention  . Codeine Nausea And Vomiting  . Ezetimibe Other (See Comments)    Made weak  . Hydroxychloroquine Other (See Comments)    Low platlets  . Naproxen Other (See Comments)    Shrinks bladder  . Sulfa Antibiotics Rash  . Ziac [Bisoprolol-Hydrochlorothiazide] Other (See Comments)    "stopped urination"  . Azelastine Other (See Comments)  . Elavil [Amitriptyline] Other (See Comments)    Gave Pt nightmares  . Empagliflozin Other (See Comments)    "Caused yeast infection, slowed my urine"  . Hydralazine   . Iodine   . Kenalog [Triamcinolone] Diarrhea  . Keppra [Levetiracetam] Other (See Comments)    Shaking  . Lamictal [Lamotrigine]     itching  . Lyrica [Pregabalin] Swelling  . Pseudoephedrine   . Topamax [Topiramate]     Dry eyes  . Ace Inhibitors Other (See Comments)    unknown  . Actonel [Risedronate Sodium] Other (See Comments)    unknown  . Amlodipine Besylate Other (See Comments)    unknown  . Aspirin Other (See Comments)    unknown  . Atacand [Candesartan] Other (See Comments)    unknown  . Bextra [Valdecoxib] Other (See Comments)    unknown  . Bisoprolol-Hydrochlorothiazide Other (See Comments)    unknown  . Candesartan Cilexetil Other  (See Comments)    unknown  . Cefadroxil Other (See Comments)    unknown  . Celecoxib Rash  . Hydrocodone Other (See Comments)    unknown  . Hydrocodone-Acetaminophen Other (See Comments)    unknown  . Iodinated Diagnostic Agents Rash    "All over" "All over"  . Meloxicam Other (See Comments)    unknown  . Methylprednisolone Sodium Succinate Other (See Comments)    unknown  . Nabumetone Other (See Comments)    unknown  . Penicillins Other (See Comments)    unknown  . Pseudoephedrine-Guaifenesin Other (See Comments)    unknown  . Risedronate Sodium Other (See Comments)    unknown  . Rofecoxib Other (See Comments)    unknown  . Ru-Tuss [Chlorphen-Pse-Atrop-Hyos-Scop] Other (See Comments)    unknown  . Sulfonamide Derivatives Other (See Comments)    unknown  . Sulphur [Sulfur] Other (See Comments)    unknown  . Telithromycin Other (See Comments)    unknown  . Terfenadine Other (See Comments)    unknown  . Trandolapril-Verapamil Hcl Er Other (See Comments)    Headache, difficulty urinating, high blood sugar, fluid retention  Pt is taking Tarka (trandolapril-verapamil) currently, but requests the medication stay in her allergy list  . Trandolapril-Verapamil Hcl Er Other (See Comments)    Headache, difficulty urinating, high blood sugar, fluid retention  Pt is taking Tarka (trandolapril-verapamil) currently, but requests the medication stay in her allergy list  . Valium [Diazepam] Other (See Comments)    Makes her mean and hyper   Family History  Problem Relation Age of Onset  . Heart disease Mother   . Osteoporosis Mother   . Diabetes Father   . Pancreatic cancer Father   . Colon cancer Other   . Bone cancer Sister   . Prostate cancer Brother   . Colon cancer Brother   . Rectal cancer Sister   . Thyroid disease Sister        benign goiter  resected    Prior to Admission medications   Medication Sig Start Date End Date Taking? Authorizing Provider  ALPRAZolam  (XANAX) 0.25 MG tablet TAKE 1 TABLET AT BEDTIME AS NEEDED (CAN TAKE 1 TABLET DURING THE DAY IF NEEDED FOR ANXIETY) Patient taking differently: Take 0.25 mg by mouth at bedtime as needed for anxiety.  11/14/19  Yes Ward Givens, NP  Biotin 1000 MCG tablet Take 1,000 mcg by mouth daily.    Yes [provider]  Blood Glucose Monitoring Suppl (PRODIGY VOICE BLOOD GLUCOSE) w/Device KIT Use to check blood sugar 1 time per day. 10/18/15  Yes Renato Shin, MD  Cholecalciferol (VITAMIN D-3) 125 MCG (5000 UT) TABS Take 5,000 Units by mouth in the morning and at bedtime.   Yes [provider]  cholestyramine light (PREVALITE) 4 g packet Take 0.5 packets by mouth in the morning and at bedtime. 11/29/19  Yes [provider]  Cranberry-Vitamin C-Probiotic (AZO CRANBERRY PO) Take 1 tablet by mouth daily.   Yes [provider]  cyanocobalamin (,VITAMIN B-12,) 1000 MCG/ML injection INJECT 1 ML INTRAMUSCULARLY EVERY 21 DAYS Patient taking differently: Inject 1,000 mcg into the muscle every 21 ( twenty-one) days.  06/18/16  Yes Hoyt Koch, MD  diclofenac Sodium (VOLTAREN) 1 % GEL Apply 2 g topically 4 (four) times daily as needed (pain.).  06/01/19  Yes [provider]  diphenhydrAMINE (BENADRYL) 25 MG tablet Take 25-50 mg by mouth every 6 (six) hours as needed (runny nose/allergies.).    Yes [provider]  docusate sodium (COLACE) 100 MG capsule Take 200 mg by mouth 2 (two) times daily as needed (constipation.).    Yes [provider]  escitalopram (LEXAPRO) 10 MG tablet Take 10 mg by mouth daily.   Yes [provider]  glucose blood test strip Use to check blood sugar 1 time per day. 10/22/15  Yes Renato Shin, MD  insulin aspart (NOVOLOG) 100 UNIT/ML FlexPen Inject 2-3 Units into the skin 3 (three) times daily as needed (blood sugars greater than 150=2 units & 200=3 units). Sliding Scale Insulin 06/02/19  Yes [provider]   insulin glargine (LANTUS) 100 UNIT/ML injection Inject 14 Units into the skin at bedtime.    Yes [provider]  Insulin Pen Needle 32G X 4 MM MISC Used to inject insulin 3x daily 11/19/16  Yes Renato Shin, MD  levothyroxine (SYNTHROID) 75 MCG tablet Take 75 mcg by mouth daily before breakfast.   Yes [provider]  metroNIDAZOLE (FLAGYL) 500 MG tablet Take 500 mg by mouth 3 (three) times daily.  11/14/19  Yes [provider]  neomycin (MYCIFRADIN) 500 MG tablet Take 1,000 mg by mouth 3 (three) times daily.  11/15/19  Yes [provider]  phenytoin (DILANTIN) 100 MG ER capsule Take 1 capsule (100 mg) in the morning and Take 2 capsules (200 mg) at bedtime Patient taking differently: Take 100-200 mg by mouth See admin instructions. Take 1 capsule (100 mg) in the morning & Take 2 capsules (200 mg) at bedtime 05/16/19  Yes Ward Givens, NP  phenytoin (DILANTIN) 50 MG tablet Chew 50 mg by mouth daily. Total morning dose=150 mg   Yes [provider]  Polyethyl Glycol-Propyl Glycol (SYSTANE OP) Place 1 drop into both eyes in the morning, at noon, in the evening, and at bedtime.    Yes [provider]  primidone (MYSOLINE) 50 MG tablet Three tablets in the morning and one at dinner time Patient taking differently:  Take 100-150 mg by mouth See admin instructions. Take 3 tablets (150 mg) by mouth in the morning & take 2 tablets (100 mg) by mouth in the afternoon. 12/04/19  Yes Kathrynn Ducking, MD  silver sulfADIAZINE (SILVADENE) 1 % cream Apply 1 application topically daily as needed (itching.).   Yes [provider]  Thiamine HCl (VITAMIN B-1) 250 MG tablet Take 250 mg by mouth at bedtime.   Yes [provider]  traMADol (ULTRAM) 50 MG tablet Take 1 tablet (50 mg total) by mouth every 6 (six) hours as needed for severe pain. 01/02/20  Yes Long, Wonda Olds, MD  trandolapril-verapamil (TARKA) 2-240 MG tablet Take 1 tablet by mouth at  bedtime.    Yes [provider]  lidocaine (LIDODERM) 5 % Place 1 patch onto the skin daily. Remove & Discard patch within 12 hours or as directed by MD Patient taking differently: Place 1-2 patches onto the skin daily as needed (pain). Remove & Discard patch within 12 hours or as directed by MD 01/25/15   Hoyt Koch, MD   Physical Exam: Blood pressure 130/66, pulse 80, temperature (!) 97.4 F (36.3 C), resp. rate 20, weight 58.5 kg, SpO2 100 %. Vitals:   03/06/20 1345 03/06/20 1400  BP: 136/69 130/66  Pulse: 75 80  Resp: 12 20  Temp:  (!) 97.4 F (36.3 C)  SpO2: 100% 100%    General: 86 y.o. female resting in bed in NAD Eyes: PERRL, normal sclera ENMT: Nares patent w/o discharge, orophaynx clear, dentition normal, ears w/o discharge/lesions/ulcers Neck: Supple, trachea midline Cardiovascular: RRR, +S1, S2, no m/g/r, equal pulses throughout Respiratory: CTABL, no w/r/r, normal WOB GI: BS hypoactive, ND, global TTP, no masses noted, no organomegaly noted MSK: No e/c/c Skin: No rashes, bruises, ulcerations noted Neuro: A&O x 3 (groggy and slow to answer), no focal deficits observed Psyc: calm/cooperative; but quite groggy  Labs on Admission:  Basic Metabolic Panel: Recent Labs  Lab 03/04/20 1138  NA 130*  K 4.9  CL 97*  CO2 22  GLUCOSE 71  BUN 10  CREATININE 0.69  CALCIUM 9.0   Liver Function Tests: Recent Labs  Lab 03/04/20 1138  AST 30  ALT 35  ALKPHOS 140*  BILITOT 0.4  PROT 7.0  ALBUMIN 3.7   No results for input(s): LIPASE, AMYLASE in the last 168 hours. No results for input(s): AMMONIA in the last 168 hours. CBC: Recent Labs  Lab 03/04/20 1138  WBC 9.0  NEUTROABS 6.2  HGB 8.2*  HCT 27.1*  MCV 74.7*  PLT 556*   Cardiac Enzymes: No results for input(s): CKTOTAL, CKMB, CKMBINDEX, TROPONINI in the last 168 hours. BNP: Invalid input(s): POCBNP CBG: Recent Labs  Lab 03/04/20 1041 03/06/20 0816  GLUCAP 142* 176*     Radiological Exams on Admission: No results found.  Time spent: 60 minutes spent in the coordination of this consult.  Jonnie Finner DO [facility]  If 7PM-7AM, please contact night-coverage [email or web address] 03/06/2020, 2:14 PM

## 2020-03-06 NOTE — Anesthesia Procedure Notes (Signed)
Arterial Line Insertion Start/End11/07/2019 10:15 AM, 03/06/2020 10:20 AM Performed by: Lidia Collum, MD, anesthesiologist  Patient location: OR. Preanesthetic checklist: patient identified, IV checked, site marked, risks and benefits discussed, surgical consent, monitors and equipment checked, pre-op evaluation, timeout performed and anesthesia consent Right, radial was placed Catheter size: 20 G Hand hygiene performed , maximum sterile barriers used  and Seldinger technique used Allen's test indicative of satisfactory collateral circulation Attempts: 1 Procedure performed without using ultrasound guided technique. Following insertion, dressing applied. Post procedure assessment: normal  Patient tolerated the procedure well with no immediate complications.

## 2020-03-06 NOTE — Transfer of Care (Signed)
Immediate Anesthesia Transfer of Care Note  Patient: [name]  Procedure(s) Performed: LAPAROSCOPIC RIGHT HEMI COLECTOMY WITH TAP BLOCK AND LYSIS OF ADHESIONS (Right Abdomen)  Patient Location: PACU  Anesthesia Type:General  Level of Consciousness: drowsy  Airway & Oxygen Therapy: Patient Spontanous Breathing and Patient connected to face mask oxygen  Post-op Assessment: Report given to RN and Post -op Vital signs reviewed and stable  Post vital signs: Reviewed and stable  Last Vitals:  Vitals Value Taken Time  BP 140/74 03/06/20 1303  Temp    Pulse 74 03/06/20 1306  Resp 16 03/06/20 1306  SpO2 100 % 03/06/20 1306  Vitals shown include unvalidated device data.  Last Pain:  Vitals:   03/06/20 0822  TempSrc: Oral         Complications: No complications documented.

## 2020-03-06 NOTE — Anesthesia Procedure Notes (Signed)
Procedure Name: Intubation Date/Time: 03/06/2020 10:27 AM Performed by: Gwyndolyn Saxon, CRNA Pre-anesthesia Checklist: Patient identified, Emergency Drugs available, Suction available and Patient being monitored Patient Re-evaluated:Patient Re-evaluated prior to induction Oxygen Delivery Method: Circle system utilized Preoxygenation: Pre-oxygenation with 100% oxygen Induction Type: IV induction Ventilation: Mask ventilation without difficulty Laryngoscope Size: Miller and 2 Grade View: Grade I Tube type: Oral Tube size: 6.5 mm Number of attempts: 1 Airway Equipment and Method: Patient positioned with wedge pillow and Stylet Placement Confirmation: ETT inserted through vocal cords under direct vision,  positive ETCO2 and breath sounds checked- equal and bilateral Secured at: 21 cm Tube secured with: Tape Dental Injury: Teeth and Oropharynx as per pre-operative assessment

## 2020-03-06 NOTE — H&P (Signed)
CC: Referred by Dr. Kalman Shan for newly diagnosed colon cancer - here today for surgery  HPI: [name] is a very pleasant 20yoF with hx of HTN, DM, Seizures (now s/p excision of mass from head, no further seizures but on antiepileptics), who lives alone and gets around at home fine with the assistance of care aids. She has had a multi-month history of loose stool as well as anemia of unclear etiology. She underwent upper and lower endoscopy at Dr. Kalman Shan 11/07/19 which demonstrated a 2 cm polyp in the cecum that was semi-sessile and biopsied. Additionally, a mass in the proximal ascending colon 3 haustrations above the ileocecal valve that measured 6-8 cm across. Rather mobile, soft, fleshy with some ulceration. Biopsy returned invasive adenocarcinoma. The cecal polyp returned as a villous adenoma. She was referred to [place]. She denies any complaints today side from her baseline bowel changes with loose or watery stool. She denies nausea/vomiting or abdominal pain.  She was initially scheduled for surgery however, on August 28 unfortunately had a fall at home and sustained a distal tibia fracture. She has been followed with orthopedics, Dr. Doran Durand. She was initially managed with a cast and as of yesterday was transitioned to a boot. She reports that she has been cleared for ambulation but has sustained significant deconditioning as she has been a wheelchair since August 28. She is working with rehabilitation at this point in time. Since her fall, she voluntarily checked into a nursing home to assist in her care if she is previously lived independently at home with the aid of some caregivers. Overall, she reports she has been feeling well. She denies any fever/chills/nausea/following/abdominal pain. She is having regular bowel movements.  She denies any changes in her health or health history since we met in the office.   PMH: HTN, DM, Seizures (now s/p excision of mass from head, no further  seizures but on antiepileptics); breast cancer (s/p Lumpectomy/SLN Dr. Excell Seltzer, 2014)  PSH: Laparoscopic cholecystectomy (07/17/1999, Dr. Lennie Hummer); TAH via low midline incision; ?R inguinal hernia repair many years ago  FHx: Reports her sister had rectal cancer and her father had pancreatic cancer  Social: Denies use of tobacco/EtOH/drugs. She is happily retired  ROS: A comprehensive 10 system review of systems was completed with the patient and pertinent findings as noted above.   Past Medical History:  Diagnosis Date  . Anemia   . Anxiety   . Asthma   . Breast cancer ([place]) 2014   right breast  . Cancer of right breast ([place]) 12/26/12   right breast 12:00 o'clock, DCIS  . Carotid artery disease ([place])   . Carpal tunnel syndrome, bilateral   . Chronic bronchitis ([place])   . Chronic cough   . Chronic facial pain   . Chronic foot pain   . Colon cancer ([place]) dx'd 11/2019  . Complication of anesthesia    Sore jaw; could not chew or move mouth, prolonged sedation  . Convulsions/seizures ([place]) 10/16/2014  . Diabetes mellitus    type 2 niddm x 20 years  . Dyslipidemia   . Ejection fraction   . Gait abnormality 12/04/2019  . GERD (gastroesophageal reflux disease)   . Hammer toe    bilateral  . History of colonic polyps   . History of meningioma   . HTN (hypertension)   . Hx of radiation therapy 03/07/13- 03/29/13   right breast 4250 cGy 17 sessions  . Hyperlipidemia   . Hypokalemia   . Hyponatremia   .  Hypothyroidism   . IBS (irritable bowel syndrome)   . Melanoma ([place])   . Metatarsal bone fracture 2014  . Multiple drug allergies   . Nontoxic thyroid nodule   . Obesity   . Osteoarthritis   . Osteoporosis   . Palpitations   . Personal history of radiation therapy 2014  . Seasonal allergies   . Skin cancer   . Syncope   . Tremor, essential 08/18/2016  . Vitamin B12 deficiency   . Vitamin D deficiency     Past Surgical History:  Procedure Laterality Date  . ABDOMINAL  HYSTERECTOMY    . BIOPSY  11/07/2019   Procedure: BIOPSY;  Surgeon: Ronald Lobo, MD;  Location: [facility];  Service: Endoscopy;;  . BRAIN SURGERY    . BREAST BIOPSY Right 01/24/2013   Procedure: RE-EXCICION OF BREAST CANCER, ANTERIOR MARGINS;  Surgeon: Edward Jolly, MD;  Location: [facility];  Service: General;  Laterality: Right;  . BREAST LUMPECTOMY Right 2014  . BREAST LUMPECTOMY WITH NEEDLE LOCALIZATION Right 01/17/2013   Procedure: BREAST LUMPECTOMY WITH NEEDLE LOCALIZATION;  Surgeon: Edward Jolly, MD;  Location: [facility];  Service: General;  Laterality: Right;  . BUNIONECTOMY Bilateral   . CATARACT EXTRACTION W/ INTRAOCULAR LENS IMPLANT Right   . CHOLECYSTECTOMY    . COLONOSCOPY    . COLONOSCOPY WITH PROPOFOL N/A 11/07/2019   Procedure: COLONOSCOPY WITH PROPOFOL;  Surgeon: Ronald Lobo, MD;  Location: [facility];  Service: Endoscopy;  Laterality: N/A;  . CRANIOTOMY Right 10/18/2013   Procedure: CRANIOTOMY TUMOR EXCISION;  Surgeon: Floyce Stakes, MD;  Location: [facility];  Service: Neurosurgery;  Laterality: Right;  . ESOPHAGOGASTRODUODENOSCOPY (EGD) WITH PROPOFOL N/A 11/07/2019   Procedure: ESOPHAGOGASTRODUODENOSCOPY (EGD) WITH PROPOFOL;  Surgeon: Ronald Lobo, MD;  Location: [facility];  Service: Endoscopy;  Laterality: N/A;  . EYE SURGERY    . HERNIA REPAIR    . KNEE ARTHROSCOPY Bilateral   . POLYPECTOMY     small adenomatous  . ULNAR TUNNEL RELEASE      Family History  Problem Relation Age of Onset  . Heart disease Mother   . Osteoporosis Mother   . Diabetes Father   . Pancreatic cancer Father   . Colon cancer Other   . Bone cancer Sister   . Prostate cancer Brother   . Colon cancer Brother   . Rectal cancer Sister   . Thyroid disease Sister        benign goiter resected    Social:  reports that she has never smoked. She has never used smokeless tobacco. She reports that she does not drink alcohol and does not use drugs.  Allergies:   Allergies  Allergen Reactions  . Bystolic [Nebivolol Hcl] Other (See Comments)    "extreme weakness, heaviness in legs & arms, swelling in legs/arms/face, swollen abdomen, pain in bladder, feet pain, soreness in chest"  . Cholestyramine     "itching rash on stomach, bloated, nausea, vomiting, sleeplessness, extreme pain in arms"  . Hydrazine Yellow [Tartrazine] Other (See Comments)    "does not reduce high blood pressure, pain in arm, high pressure, felt like I was on verge of heart attack, really weak"  . Morphine Other (See Comments)    Feels morbid, weak, still in pain  . Niacin Palpitations    Fast heart beat  . Niaspan [Niacin Er] Palpitations and Other (See Comments)    "fast heart beat, high blood pressure"  . Norvasc [Amlodipine Besylate] Other (See Comments)    "  extreme fluid retention/pain)  . Optivar [Azelastine Hcl] Photosensitivity  . Repaglinide Hives  . Sular [Nisoldipine Er] Other (See Comments)    "severe headaches, swelling eyes, hands, feet, shortness of breath, weak, flushed face, brain boiling, fluid retention, high blood sugar, nervous, heart fast beating"  . Tegretol [Carbamazepine] Other (See Comments)    Blood poisoning   . Telmisartan Other (See Comments)    "headache, difficulty urinating, high blood sugar, fluid retention"  . Amoxicillin-Pot Clavulanate Rash  . Cefdinir Swelling    Vaginal irritation, breathing,   . Ciprofloxacin Hcl Hives  . Clonidine Other (See Comments)    Dry mouth, fluid retention  . Clonidine Hydrochloride Other (See Comments)    Dry mouth, fluid retention  . Codeine Nausea And Vomiting  . Ezetimibe Other (See Comments)    Made weak  . Hydroxychloroquine Other (See Comments)    Low platlets  . Naproxen Other (See Comments)    Shrinks bladder  . Sulfa Antibiotics Rash  . Ziac [Bisoprolol-Hydrochlorothiazide] Other (See Comments)    "stopped urination"  . Azelastine Other (See Comments)  . Elavil [Amitriptyline] Other (See  Comments)    Gave Pt nightmares  . Empagliflozin Other (See Comments)    "Caused yeast infection, slowed my urine"  . Hydralazine   . Iodine   . Kenalog [Triamcinolone] Diarrhea  . Keppra [Levetiracetam] Other (See Comments)    Shaking  . Lamictal [Lamotrigine]     itching  . Lyrica [Pregabalin] Swelling  . Pseudoephedrine   . Topamax [Topiramate]     Dry eyes  . Ace Inhibitors Other (See Comments)    unknown  . Actonel [Risedronate Sodium] Other (See Comments)    unknown  . Amlodipine Besylate Other (See Comments)    unknown  . Aspirin Other (See Comments)    unknown  . Atacand [Candesartan] Other (See Comments)    unknown  . Bextra [Valdecoxib] Other (See Comments)    unknown  . Bisoprolol-Hydrochlorothiazide Other (See Comments)    unknown  . Candesartan Cilexetil Other (See Comments)    unknown  . Cefadroxil Other (See Comments)    unknown  . Celecoxib Rash  . Hydrocodone Other (See Comments)    unknown  . Hydrocodone-Acetaminophen Other (See Comments)    unknown  . Iodinated Diagnostic Agents Rash    "All over" "All over"  . Meloxicam Other (See Comments)    unknown  . Methylprednisolone Sodium Succinate Other (See Comments)    unknown  . Nabumetone Other (See Comments)    unknown  . Penicillins Other (See Comments)    unknown  . Pseudoephedrine-Guaifenesin Other (See Comments)    unknown  . Risedronate Sodium Other (See Comments)    unknown  . Rofecoxib Other (See Comments)    unknown  . Ru-Tuss [Chlorphen-Pse-Atrop-Hyos-Scop] Other (See Comments)    unknown  . Sulfonamide Derivatives Other (See Comments)    unknown  . Sulphur [Sulfur] Other (See Comments)    unknown  . Telithromycin Other (See Comments)    unknown  . Terfenadine Other (See Comments)    unknown  . Trandolapril-Verapamil Hcl Er Other (See Comments)    Headache, difficulty urinating, high blood sugar, fluid retention  Pt is taking Tarka (trandolapril-verapamil) currently, but  requests the medication stay in her allergy list  . Trandolapril-Verapamil Hcl Er Other (See Comments)    Headache, difficulty urinating, high blood sugar, fluid retention  Pt is taking Tarka (trandolapril-verapamil) currently, but requests the medication stay in her allergy list  .  Valium [Diazepam] Other (See Comments)    Makes her mean and hyper    Medications: I have reviewed the patient's current medications.  Results for orders placed or performed during the hospital encounter of 03/06/20 (from the past 48 hour(s))  Glucose, capillary     Status: Abnormal   Collection Time: 03/06/20  8:16 AM  Result Value Ref Range   Glucose-Capillary 176 (H) 70 - 99 mg/dL    Comment: Glucose reference range applies only to samples taken after fasting for at least 8 hours.   Comment 1 Notify RN    Comment 2 Document in Chart     No results found.  ROS - all of the below systems have been reviewed with the patient and positives are indicated with bold text General: chills, fever or night sweats Eyes: blurry vision or double vision ENT: epistaxis or sore throat Allergy/Immunology: itchy/watery eyes or nasal congestion Hematologic/Lymphatic: bleeding problems, blood clots or swollen lymph nodes Endocrine: temperature intolerance or unexpected weight changes Breast: new or changing breast lumps or nipple discharge Resp: cough, shortness of breath, or wheezing CV: chest pain or dyspnea on exertion GI: as per HPI GU: dysuria, trouble voiding, or hematuria MSK: joint pain or joint stiffness Neuro: TIA or stroke symptoms Derm: pruritus and skin lesion changes Psych: anxiety and depression  PE Blood pressure (!) 142/74, pulse 78, temperature 97.6 F (36.4 C), temperature source Oral, resp. rate 16, weight 58.5 kg, SpO2 99 %. Constitutional: NAD; conversant Eyes: Moist conjunctiva; no lid lag; anicteric Lungs: Normal respiratory effort CV: RRR; no palpable thrills GI: Abd soft, NT/ND; no  palpable hepatosplenomegaly Psychiatric: Appropriate affect; alert and oriented x3  Results for orders placed or performed during the hospital encounter of 03/06/20 (from the past 48 hour(s))  Glucose, capillary     Status: Abnormal   Collection Time: 03/06/20  8:16 AM  Result Value Ref Range   Glucose-Capillary 176 (H) 70 - 99 mg/dL    Comment: Glucose reference range applies only to samples taken after fasting for at least 8 hours.   Comment 1 Notify RN    Comment 2 Document in Chart     No results found.  A/P: [name] is a very pleasant 84yoF with hx of HTN, DM, Seizures (now s/p excision of mass from head, no further seizures but on antiepileptics), breast cancer - here for surgery for colon cancer of ascending colon + endoscopically unresectable villous adenoma of cecum  -She has been cleared for surgery -Staging CT CAP which I have reviewed shows lesion to be in ~proximal ascending colon; there is another cecal polyp that was not resected but this was not readily apparent on her CT. No evidence of metastatic disease; some mesenteric adenopathy noted. -CAP showed some findings of chronic pancreatitis with some small cysts - will need follow-up imaging per rads in ~2 years - pancreas protocol  -We discussed again laparoscopic and potential open techniques to a right hemicolectomy.  -The planned procedure, material risks (including, but not limited to, pain, bleeding, infection, scarring, need for blood transfusion, damage to surrounding structures- blood vessels/nerves/viscus/organs especially with adhesions present, damage to ureter, urine leak, leak from anastomosis, need for additional procedures, worsening of pre-existing medical conditions, need for stoma which Drolet be permanent, hernia, recurrence of cancer, DVT/PE, pneumonia, heart attack, stroke, death) benefits and alternatives to surgery were discussed at length. The patient's questions were answered to her satisfaction, she  voiced understanding and elected to proceed with surgery. Additionally, we  discussed typical postoperative expectations and the recovery process. We also discussed the areas were adjuvant chemotherapy Volker be recommended or indicated  -I specifically spent extra time outlining increased risks of surgery in her particular case, what these [place] entail, given her change in activity status and that complications at her age carry a much higher overall burden including for mortality. She clearly expressed understanding and is wishing to proceed.  Sharon Mt. Dema Severin, M.D. [facility]

## 2020-03-07 ENCOUNTER — Other Ambulatory Visit: Payer: Self-pay

## 2020-03-07 ENCOUNTER — Encounter (HOSPITAL_COMMUNITY): Payer: Self-pay | Admitting: Surgery

## 2020-03-07 DIAGNOSIS — Z9049 Acquired absence of other specified parts of digestive tract: Secondary | ICD-10-CM

## 2020-03-07 LAB — CBC WITH DIFFERENTIAL/PLATELET
Abs Immature Granulocytes: 0.03 10*3/uL (ref 0.00–0.07)
Basophils Absolute: 0 10*3/uL (ref 0.0–0.1)
Basophils Relative: 0 %
Eosinophils Absolute: 0.3 10*3/uL (ref 0.0–0.5)
Eosinophils Relative: 3 %
HCT: 28.8 % — ABNORMAL LOW (ref 36.0–46.0)
Hemoglobin: 9.2 g/dL — ABNORMAL LOW (ref 12.0–15.0)
Immature Granulocytes: 0 %
Lymphocytes Relative: 10 %
Lymphs Abs: 0.9 10*3/uL (ref 0.7–4.0)
MCH: 25 pg — ABNORMAL LOW (ref 26.0–34.0)
MCHC: 31.9 g/dL (ref 30.0–36.0)
MCV: 78.3 fL — ABNORMAL LOW (ref 80.0–100.0)
Monocytes Absolute: 0.7 10*3/uL (ref 0.1–1.0)
Monocytes Relative: 8 %
Neutro Abs: 7.1 10*3/uL (ref 1.7–7.7)
Neutrophils Relative %: 79 %
Platelets: 338 10*3/uL (ref 150–400)
RBC: 3.68 MIL/uL — ABNORMAL LOW (ref 3.87–5.11)
RDW: 18 % — ABNORMAL HIGH (ref 11.5–15.5)
WBC: 9 10*3/uL (ref 4.0–10.5)
nRBC: 0 % (ref 0.0–0.2)

## 2020-03-07 LAB — CBC
HCT: 20.5 % — ABNORMAL LOW (ref 36.0–46.0)
Hemoglobin: 6.3 g/dL — CL (ref 12.0–15.0)
MCH: 22.8 pg — ABNORMAL LOW (ref 26.0–34.0)
MCHC: 30.7 g/dL (ref 30.0–36.0)
MCV: 74.3 fL — ABNORMAL LOW (ref 80.0–100.0)
Platelets: 383 10*3/uL (ref 150–400)
RBC: 2.76 MIL/uL — ABNORMAL LOW (ref 3.87–5.11)
RDW: 16.3 % — ABNORMAL HIGH (ref 11.5–15.5)
WBC: 10.1 10*3/uL (ref 4.0–10.5)
nRBC: 0 % (ref 0.0–0.2)

## 2020-03-07 LAB — GLUCOSE, CAPILLARY
Glucose-Capillary: 226 mg/dL — ABNORMAL HIGH (ref 70–99)
Glucose-Capillary: 185 mg/dL — ABNORMAL HIGH (ref 70–99)
Glucose-Capillary: 181 mg/dL — ABNORMAL HIGH (ref 70–99)
Glucose-Capillary: 127 mg/dL — ABNORMAL HIGH (ref 70–99)

## 2020-03-07 LAB — IRON AND TIBC
Iron: 14 ug/dL — ABNORMAL LOW (ref 28–170)
Saturation Ratios: 4 % — ABNORMAL LOW (ref 10.4–31.8)
TIBC: 321 ug/dL (ref 250–450)
UIBC: 307 ug/dL

## 2020-03-07 LAB — BASIC METABOLIC PANEL
Anion gap: 7 (ref 5–15)
BUN: 6 mg/dL — ABNORMAL LOW (ref 8–23)
CO2: 21 mmol/L — ABNORMAL LOW (ref 22–32)
Calcium: 7.6 mg/dL — ABNORMAL LOW (ref 8.9–10.3)
Chloride: 97 mmol/L — ABNORMAL LOW (ref 98–111)
Creatinine, Ser: 0.74 mg/dL (ref 0.44–1.00)
GFR, Estimated: >60 mL/min (ref >60)
Glucose, Bld: 214 mg/dL — ABNORMAL HIGH (ref 70–99)
Potassium: 4.3 mmol/L (ref 3.5–5.1)
Sodium: 125 mmol/L — ABNORMAL LOW (ref 135–145)

## 2020-03-07 LAB — PREPARE RBC (CROSSMATCH)

## 2020-03-07 MED ORDER — SODIUM CHLORIDE 0.9% IV SOLUTION: Freq: Once | INTRAVENOUS | Status: AC

## 2020-03-07 MED ORDER — VERAPAMIL HCL ER 120 MG PO TBCR
120.0000 mg | EXTENDED_RELEASE_TABLET | Freq: Every day | ORAL | Status: DC
  Administered 2020-03-07 – 2020-03-11 (×5): 120 mg via ORAL
  Filled 2020-03-07 (×6): qty 1

## 2020-03-07 MED ORDER — PROCHLORPERAZINE EDISYLATE 10 MG/2ML IJ SOLN
10.0000 mg | Freq: Four times a day (QID) | INTRAMUSCULAR | Status: DC | PRN
  Administered 2020-03-07 – 2020-03-08 (×3): 10 mg via INTRAVENOUS
  Filled 2020-03-07 (×3): qty 2

## 2020-03-07 MED ORDER — SODIUM CHLORIDE 0.9 % IV BOLUS
1000.0000 mL | Freq: Once | INTRAVENOUS | Status: AC
  Administered 2020-03-07: 1000 mL via INTRAVENOUS

## 2020-03-07 MED ORDER — SODIUM CHLORIDE 0.9 % IV SOLN: INTRAVENOUS | Status: DC

## 2020-03-07 NOTE — Progress Notes (Signed)
Education provided on the importance of mobility after surgery, including possible outcomes such as bowel ileus. Patient acknowledged risk, and continues to refuse to ambulate.

## 2020-03-07 NOTE — Progress Notes (Signed)
Pt bladder scanned and showed 82 ml. Reports she had just voided prior to transferring to unit when she had a large BM  Prior to coming. Reports this is also what she typically does at home since stools are diarrhea and mostly liquid anyway.

## 2020-03-07 NOTE — Progress Notes (Signed)
OT Cancellation Note  Patient Details Name: [name] MRN: [id number] DOB: [birth year]   Cancelled Treatment:    Reason Eval/Treat Not Completed: Medical issues which prohibited therapy. Patient with low H+H and transfer to 4th floor. Will check back as schedule permits.  Delbert Phenix OT OT pager: [phone]   Rosemary Holms 03/07/2020, 12:22 PM

## 2020-03-07 NOTE — Progress Notes (Signed)
PT Cancellation Note  Patient Details Name: [name] MRN: [id number] DOB: [birth year]   Cancelled Treatment:    Reason Eval/Treat Not Completed: Other (comment) Pt refuses.  Pt aware of complications from not mobilizing (RN also in room).     Brinson Tozzi,KATHrine E 03/07/2020, 3:34 PM Jannette Spanner PT, DPT Acute Rehabilitation Services Pager: [phone] Office: [phone]

## 2020-03-07 NOTE — Progress Notes (Signed)
CRITICAL VALUE STICKER  CRITICAL VALUE: Hgb 6.3  RECEIVER (on-site recipient of call): [name]  DATE & TIME NOTIFIED: 03/07/20 @0600   MESSENGER (representative from lab):  MD NOTIFIED: Sharlet Salina  TIME OF NOTIFICATION: 0608  RESPONSE: awaiting call back, paged on amion  Information also of nauseous episode and vomiting x2 since surgery and now has distended abdomen

## 2020-03-07 NOTE — Progress Notes (Signed)
Subjective Hemoglobin dropped some - anemic prior to surgery as well. Had some n/v last night but currently without nausea. Had blood admixed with stool during BM today. Mildly bloated feeling. Has not been up yet.  Objective: Vital signs in last 24 hours: Temp:  [97.3 F (36.3 C)-98.1 F (36.7 C)] 97.4 F (36.3 C) (11/04 0514) Pulse Rate:  [73-92] 92 (11/04 0514) Resp:  [12-20] 18 (11/04 0514) BP: (117-142)/(64-80) 125/69 (11/04 0514) SpO2:  [90 %-100 %] 100 % (11/04 0514) Weight:  [58.5 kg] 58.5 kg (11/04 0300) Last BM Date: 03/06/20  Intake/Output from previous day: 11/03 0701 - 11/04 0700 In: 2262 [P.O.:480; I.V.:1375; IV Piggyback:407] Out: 2150 [Urine:1800; Emesis/NG output:300; Blood:50] Intake/Output this shift: No intake/output data recorded.  Gen: NAD, comfortable CV: RRR Pulm: Normal work of breathing Abd: Soft, mildly distended; appropriately ttp around incisions which are c/d/i without erythema nor drainage. Ext: SCDs in place  Lab Results: CBC  Recent Labs    03/04/20 1138 03/07/20 0458  WBC 9.0 10.1  HGB 8.2* 6.3*  HCT 27.1* 20.5*  PLT 556* 383   BMET Recent Labs    03/04/20 1138 03/07/20 0458  NA 130* 125*  K 4.9 4.3  CL 97* 97*  CO2 22 21*  GLUCOSE 71 214*  BUN 10 6*  CREATININE 0.69 0.74  CALCIUM 9.0 7.6*   PT/INR Recent Labs    03/04/20 1138  LABPROT 12.8  INR 1.0   ABG No results for input(s): PHART, HCO3 in the last 72 hours.  Invalid input(s): PCO2, PO2  Studies/Results:  Anti-infectives: Anti-infectives (From admission, onward)   Start     Dose/Rate Route Frequency Ordered Stop   03/06/20 1400  neomycin (MYCIFRADIN) tablet 1,000 mg  Status:  Discontinued       "And" Linked Group Details   1,000 mg Oral 3 times per day 03/06/20 0816 03/06/20 0829   03/06/20 1400  metroNIDAZOLE (FLAGYL) tablet 1,000 mg  Status:  Discontinued       "And" Linked Group Details   1,000 mg Oral 3 times per day 03/06/20 0816 03/06/20 0829    03/06/20 0832  clindamycin (CLEOCIN) 900 MG/50ML IVPB       Note to Pharmacy: Charmayne Sheer   : cabinet override      03/06/20 0832 03/06/20 1047   03/06/20 0600  gentamicin (GARAMYCIN) 280 mg in dextrose 5 % 100 mL IVPB       "And" Linked Group Details   280 mg 107 mL/hr over 60 Minutes Intravenous On call to O.R. 03/05/20 0845 03/06/20 1014   03/05/20 0845  clindamycin (CLEOCIN) IVPB 900 mg       "And" Linked Group Details   900 mg 100 mL/hr over 30 Minutes Intravenous 60 min pre-op 03/05/20 0845 03/06/20 1053       Assessment/Plan: Patient Active Problem List   Diagnosis Date Noted  . S/P right hemicolectomy 03/06/2020  . Gait abnormality 12/04/2019  . Hypokalemia   . Effusion of left knee joint   . Hyponatremia   . Left patella fracture 10/26/2018  . Acquired hypothyroidism 08/25/2017  . 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 Hammer toe 08/25/2017  . History of breast cancer 08/25/2017  . Nontoxic thyroid nodule 08/25/2017  . Osteopetrosis 08/25/2017  . Dysuria 01/19/2017  . Vitamin D deficiency 11/18/2016  . Tremor, essential 08/18/2016  . Convulsions/seizures ([place]) 10/16/2014  . Brain tumor (benign) ([place]) 10/18/2013  . Subdural hemorrhage ([place]) 10/09/2013  . Anxiety 10/09/2013  .  Compression fracture of T12 vertebra ([place]) 10/09/2013  . Ejection fraction   . Multiple thyroid nodules 09/05/2013  . Syncope 08/27/2013  . Carotid artery disease ([place]) 08/27/2013  . Abnormal thyroid ultrasound 08/27/2013  . Cancer of right breast ([place])   . Cancer of central portion of female breast ([place]) 12/30/2012  . Osteoporosis 03/06/2010  . ASTHMA 03/05/2009  . IRRITABLE BOWEL SYNDROME  diarrhea type 03/21/2008  . ANEMIA-NOS 12/16/2006  . GERD 12/16/2006  . Insulin dependent diabetes mellitus 10/29/2006  . Hyperlipidemia 10/29/2006  . Essential hypertension 10/29/2006  . Allergic rhinitis, cause unspecified 10/29/2006  . OSTEOARTHRITIS 10/29/2006   s/p  Procedure(s): LAPAROSCOPIC RIGHT HEMI COLECTOMY WITH TAP BLOCK AND LYSIS OF ADHESIONS 03/06/2020  -We reviewed findings and procedure -Will plan to give 2U PRBC, gentle IVF bolus; monitor -NPO, MIVF -Cardiac monitoring - certainly at risk for afib/rvr given known left atrial dilation, fluid shifts, age, etc -Work with therapies -PPx: SCDs; holding chemical dvt ppx today given acute blood loss anemia -Dispo: Ultimately planning SNF which is where she resided preoperatively as well -I will reach out to her niece and update her as well   LOS: 1 day   Sharon Mt. Dema Severin, M.D. [facility] Use [email or web address] to contact on call provider

## 2020-03-07 NOTE — Progress Notes (Signed)
Asked by patients RN to see patient  Patient is s/p Laparoscopic right hemicolectomy, Laparoscopic lysis of adhesions x 120 minutes, Bilateral transversus abdominus plane blocks on 11/3 by Dr. Dema Severin  Patient reported to have 2 episodes of emesis overnight. She was noted by RN to have a bloody BM this AM. Hgb 6.3 this AM. Patient reports to me ongoing nausea and intermittent right sided abdominal pain and nausea. Foley out this AM. Patient has not voided since foley removal.   Vitals/PE Blood pressure 125/69, pulse 92, temperature (!) 97.4 F (36.3 C), temperature source Oral, resp. rate 18, height 5' 7.5" (1.715 m), weight 58.5 kg, SpO2 100 %. Gen: Awake and conversant    Card:  Reg rate Pulm:  CTAB, no W/R/R, effort normal Abd: Soft, moderate distension, right sided tenderness, hypoactive bowel sounds, Incisions with glue intact appears well and are without drainage, bleeding, or signs of infection Ext:  No LE edema Psych: A&Ox3 Skin: no rashes noted, warm and dry  Plan:  Discussed with Dr. Dema Severin  2U PRBC. D/c heparin  1L bolus over 2 hours Start cardiac monitoring and transfer to tele bed Make NPO If patient has not voided in 4 hours, will need bladder scan We will follow closely   Alferd Apa, [facility] 03/07/2020, 8:18 AM

## 2020-03-07 NOTE — Progress Notes (Signed)
Patient has an order for Verapamil that states "Take with Trandolapril", but has no Trandolapril order. Previously, order was linked in [facility] as one order. Message sent to on call for additional clarification, X. Blount. Message sent to current attending to ensure medication order is complete.

## 2020-03-07 NOTE — Progress Notes (Signed)
Report called to [place] on [address]. Just prior to tx pt was incontinent of another mod/lg amt of liquid red-brown stool. Pt stated she thought that she was also incontinent of some urine at the same time. Transferred via bed to rm 1435

## 2020-03-07 NOTE — Progress Notes (Signed)
PROGRESS NOTE    [name]  QQV:[id number] DOB: [birth year] DOA: 03/06/2020 PCP: Bartholome Bill, MD    Brief Narrative: 86 year old female with recently diagnosed colon cancer adenocarcinoma status post laparoscopic right hemi-colectomy and lysis of adhesions 03/06/2020 by Dr. Dema Severin.  TRH consulted for medical management.  Assessment & Plan:   Active Problems:   S/P right hemicolectomy  #1 status post laparoscopic right hemicolectomy with lysis of adhesions 03/06/2020 with acute blood loss anemia-2 units of packed RBC have been ordered by primary team.  Follow-up labs in a.m.  Hemoglobin today 6.3. Patient is n.p.o. we will keep her on normal saline.  #2 hyponatremia-sodium dropped from 1 30-1 25 DC Ringer lactate patient is currently receiving normal saline bolus.  After the bolus I will keep her on normal saline 75 cc an hour.  #3 type 2 diabetes on Lantus at home.  Continue SSI. CBG (last 3)  Recent Labs    03/06/20 2117 03/07/20 0741 03/07/20 1204  GLUCAP 193* 226* 185*    #4 history of essential hypertension -her blood pressure is soft hold BP meds.  Blood pressure 117/55. Echo from 12/14/2019-normal ejection fraction  #5 history of hypothyroidism continue levothyroxine  #6 history of brain tumor status post surgery complicated with seizures followed by neurology as an outpatient continue home meds Dilantin and Mysoline.  #7 recent distal tibia fracture followed by Ortho.   Estimated body mass index is 19.91 kg/m as calculated from the following:   Height as of this encounter: 5' 7.5" (1.715 m).   Weight as of this encounter: 58.5 kg.  DVT prophylaxis: Per primary team  code Status: Full code Family Communication: None at bedside  disposition Plan:  Status is: Inpatient   Subjective: Staff reports patient had loose bloody bowel movements overnight She denies any chest pain or shortness of breath or palpitations  Objective: Vitals:   03/07/20 1101  03/07/20 1238 03/07/20 1333 03/07/20 1452  BP: (!) 109/53 (!) 112/57 (!) 119/53 (!) 117/55  Pulse: 83 88 88 85  Resp: 13 14 14    Temp: 98 F (36.7 C) 97.9 F (36.6 C) 98.3 F (36.8 C) 97.8 F (36.6 C)  TempSrc: Oral Oral Oral Oral  SpO2: 100% 99% 99%   Weight:      Height:        Intake/Output Summary (Last 24 hours) at 03/07/2020 1505 Last data filed at 03/07/2020 1241 Gross per 24 hour  Intake 1095 ml  Output 1600 ml  Net -505 ml   Filed Weights   03/06/20 0822 03/07/20 0300  Weight: 58.5 kg 58.5 kg    Examination:  General exam: Appears mild distress due to pain Respiratory system: Clear to auscultation. Respiratory effort normal. Cardiovascular system: S1 & S2 heard, RRR. No JVD, murmurs, rubs, gallops or clicks. No pedal edema. Gastrointestinal system: Abdomen is distended, soft and tender. No organomegaly or masses felt. Normal bowel sounds heard.  Incision covered with intact dressing Central nervous system: Alert and oriented. No focal neurological deficits. Extremities: Symmetric 5 x 5 power. Skin: No rashes, lesions or ulcers Psychiatry: Judgement and insight appear normal. Mood & affect appropriate.     Data Reviewed: I have personally reviewed following labs and imaging studies  CBC: Recent Labs  Lab 03/04/20 1138 03/07/20 0458  WBC 9.0 10.1  NEUTROABS 6.2  --   HGB 8.2* 6.3*  HCT 27.1* 20.5*  MCV 74.7* 74.3*  PLT 556* 332   Basic Metabolic Panel: Recent Labs  Lab  03/04/20 1138 03/07/20 0458  NA 130* 125*  K 4.9 4.3  CL 97* 97*  CO2 22 21*  GLUCOSE 71 214*  BUN 10 6*  CREATININE 0.69 0.74  CALCIUM 9.0 7.6*   GFR: Estimated Creatinine Clearance: 45.8 mL/min (by C-G formula based on SCr of 0.74 mg/dL). Liver Function Tests: Recent Labs  Lab 03/04/20 1138  AST 30  ALT 35  ALKPHOS 140*  BILITOT 0.4  PROT 7.0  ALBUMIN 3.7   No results for input(s): LIPASE, AMYLASE in the last 168 hours. No results for input(s): AMMONIA in the last  168 hours. Coagulation Profile: Recent Labs  Lab 03/04/20 1138  INR 1.0   Cardiac Enzymes: No results for input(s): CKTOTAL, CKMB, CKMBINDEX, TROPONINI in the last 168 hours. BNP (last 3 results) No results for input(s): PROBNP in the last 8760 hours. HbA1C: No results for input(s): HGBA1C in the last 72 hours. CBG: Recent Labs  Lab 03/06/20 0816 03/06/20 1709 03/06/20 2117 03/07/20 0741 03/07/20 1204  GLUCAP 176* 264* 193* 226* 185*   Lipid Profile: No results for input(s): CHOL, HDL, LDLCALC, TRIG, CHOLHDL, LDLDIRECT in the last 72 hours. Thyroid Function Tests: No results for input(s): TSH, T4TOTAL, FREET4, T3FREE, THYROIDAB in the last 72 hours. Anemia Panel: Recent Labs    03/07/20 0458  TIBC 321  IRON 14*   Sepsis Labs: No results for input(s): PROCALCITON, LATICACIDVEN in the last 168 hours.  Recent Results (from the past 240 hour(s))  Respiratory Panel by RT PCR (Flu A&B, Covid) - Nasopharyngeal Swab     Status: None   Collection Time: 03/06/20  8:17 AM   Specimen: Nasopharyngeal Swab  Result Value Ref Range Status   SARS Coronavirus 2 by RT PCR NEGATIVE NEGATIVE Final    Comment: (NOTE) SARS-CoV-2 target nucleic acids are NOT DETECTED.  The SARS-CoV-2 RNA is generally detectable in upper respiratoy specimens during the acute phase of infection. The lowest concentration of SARS-CoV-2 viral copies this assay can detect is 131 copies/mL. A negative result does not preclude SARS-Cov-2 infection and should not be used as the sole basis for treatment or other patient management decisions. A negative result Stemler occur with  improper specimen collection/handling, submission of specimen other than nasopharyngeal swab, presence of viral mutation(s) within the areas targeted by this assay, and inadequate number of viral copies (<131 copies/mL). A negative result must be combined with clinical observations, patient history, and epidemiological information.  The expected result is Negative.  Fact Sheet for Patients:  PinkCheek.be  Fact Sheet for Healthcare Providers:  GravelBags.it  This test is no t yet approved or cleared by the [place] FDA and  has been authorized for detection and/or diagnosis of SARS-CoV-2 by FDA under an Emergency Use Authorization (EUA). This EUA will remain  in effect (meaning this test can be used) for the duration of the COVID-19 declaration under Section 564(b)(1) of the Act, 21 U.S.C. section 360bbb-3(b)(1), unless the authorization is terminated or revoked sooner.     Influenza A by PCR NEGATIVE NEGATIVE Final   Influenza B by PCR NEGATIVE NEGATIVE Final    Comment: (NOTE) The Xpert Xpress SARS-CoV-2/FLU/RSV assay is intended as an aid in  the diagnosis of influenza from Nasopharyngeal swab specimens and  should not be used as a sole basis for treatment. Nasal washings and  aspirates are unacceptable for Xpert Xpress SARS-CoV-2/FLU/RSV  testing.  Fact Sheet for Patients: PinkCheek.be  Fact Sheet for Healthcare Providers: GravelBags.it  This test is not yet  approved or cleared by the [place] and  has been authorized for detection and/or diagnosis of SARS-CoV-2 by  FDA under an Emergency Use Authorization (EUA). This EUA will remain  in effect (meaning this test can be used) for the duration of the  Covid-19 declaration under Section 564(b)(1) of the Act, 21  U.S.C. section 360bbb-3(b)(1), unless the authorization is  terminated or revoked. Performed at [facility], [place] [address], [place], [place] [address]          Radiology Studies: No results found.      Scheduled Meds: . acetaminophen  1,000 mg Oral Q6H  . alvimopan  12 mg Oral BID  . cholestyramine light  0.5 packet Oral QHS  . escitalopram  10 mg Oral Daily  . feeding supplement   237 mL Oral BID BM  . insulin aspart  0-5 Units Subcutaneous QHS  . insulin aspart  0-9 Units Subcutaneous TID WC  . levothyroxine  75 mcg Oral Q0600  . phenytoin  50 mg Oral Daily  . phenytoin  100 mg Oral Daily  . phenytoin  200 mg Oral QHS  . polyvinyl alcohol  1 drop Both Eyes TID  . primidone  100 mg Oral QPC supper  . primidone  150 mg Oral q morning - 10a  . thiamine  250 mg Oral QHS  . trandolapril  2 mg Oral QHS   And  . verapamil  240 mg Oral QHS   Continuous Infusions: . sodium chloride       LOS: 1 day   Georgette Shell, MD 03/07/2020, 3:05 PM

## 2020-03-07 NOTE — Progress Notes (Signed)
  Pt was bladder scanned and scan showed 64cc in bladder.

## 2020-03-08 ENCOUNTER — Other Ambulatory Visit (HOSPITAL_COMMUNITY): Payer: Self-pay | Admitting: Surgery

## 2020-03-08 DIAGNOSIS — Z9049 Acquired absence of other specified parts of digestive tract: Secondary | ICD-10-CM | POA: Diagnosis not present

## 2020-03-08 LAB — TYPE AND SCREEN
ABO/RH(D): A POS
Antibody Screen: NEGATIVE
Unit division: 0
Unit division: 0

## 2020-03-08 LAB — BPAM RBC
Blood Product Expiration Date: 202111272359
Blood Product Expiration Date: 202111272359
ISSUE DATE / TIME: 202111040848
ISSUE DATE / TIME: 202111041310
Unit Type and Rh: 6200
Unit Type and Rh: 6200

## 2020-03-08 LAB — BASIC METABOLIC PANEL
Anion gap: 11 (ref 5–15)
BUN: 5 mg/dL — ABNORMAL LOW (ref 8–23)
CO2: 18 mmol/L — ABNORMAL LOW (ref 22–32)
Calcium: 7.5 mg/dL — ABNORMAL LOW (ref 8.9–10.3)
Chloride: 97 mmol/L — ABNORMAL LOW (ref 98–111)
Creatinine, Ser: 0.71 mg/dL (ref 0.44–1.00)
GFR, Estimated: >60 mL/min (ref >60)
Glucose, Bld: 170 mg/dL — ABNORMAL HIGH (ref 70–99)
Potassium: 3.9 mmol/L (ref 3.5–5.1)
Sodium: 126 mmol/L — ABNORMAL LOW (ref 135–145)

## 2020-03-08 LAB — GLUCOSE, CAPILLARY
Glucose-Capillary: 147 mg/dL — ABNORMAL HIGH (ref 70–99)
Glucose-Capillary: 179 mg/dL — ABNORMAL HIGH (ref 70–99)
Glucose-Capillary: 102 mg/dL — ABNORMAL HIGH (ref 70–99)
Glucose-Capillary: 212 mg/dL — ABNORMAL HIGH (ref 70–99)

## 2020-03-08 LAB — CBC
HCT: 26.9 % — ABNORMAL LOW (ref 36.0–46.0)
Hemoglobin: 8.6 g/dL — ABNORMAL LOW (ref 12.0–15.0)
MCH: 24.9 pg — ABNORMAL LOW (ref 26.0–34.0)
MCHC: 32 g/dL (ref 30.0–36.0)
MCV: 78 fL — ABNORMAL LOW (ref 80.0–100.0)
Platelets: 290 10*3/uL (ref 150–400)
RBC: 3.45 MIL/uL — ABNORMAL LOW (ref 3.87–5.11)
RDW: 17.9 % — ABNORMAL HIGH (ref 11.5–15.5)
WBC: 7.8 10*3/uL (ref 4.0–10.5)
nRBC: 0 % (ref 0.0–0.2)

## 2020-03-08 MED ORDER — MICONAZOLE NITRATE 2 % EX CREA
TOPICAL_CREAM | Freq: Two times a day (BID) | CUTANEOUS | Status: DC
  Administered 2020-03-09 – 2020-03-10 (×2): 1 via TOPICAL
  Filled 2020-03-08 (×2): qty 28

## 2020-03-08 MED ORDER — CLOTRIMAZOLE 1 % VA CREA
1.0000 | TOPICAL_CREAM | Freq: Every day | VAGINAL | Status: AC
  Administered 2020-03-08 – 2020-03-10 (×3): 1 via VAGINAL
  Filled 2020-03-08 (×2): qty 45
  Filled 2020-03-08: qty 21

## 2020-03-08 MED ORDER — OXYCODONE HCL 5 MG PO TABS
5.0000 mg | ORAL_TABLET | Freq: Three times a day (TID) | ORAL | 0 refills | Status: DC | PRN
Start: 2020-03-08 — End: 2020-03-12

## 2020-03-08 MED FILL — oxyCODONE HCL 5 MG TABS: 5 | 3 days supply | Qty: 10 | Fill #0

## 2020-03-08 NOTE — Evaluation (Signed)
Physical Therapy Evaluation Patient Details Name: [name] MRN: [id number] DOB: 1929/11/24 Today's Date: 03/08/2020   History of Present Illness  Pt is 86 yo female with hx of HTN, DM, breast CA , and seizures.  She had recent L tib/fib (01/2020) fracture but reports she has been cleared for WBAT. Pt presented to hospital for surgery for colon CA and is s/p laparoscopic R hemicolectomy on 03/06/20.  Clinical Impression  Pt admitted with above diagnosis. Pt requiring min guard initially progressed to close supervision.  She was able to transfer and ambulate around obstacle with supervision. Presents with mild decreases in mobility, L LE strength, endurance, safety, and balance.  Pt has caregivers 8 hr a day and necessary DME.  Pt currently with functional limitations due to the deficits listed below (see PT Problem List). Pt will benefit from skilled PT to increase their independence and safety with mobility to allow discharge to the venue listed below.       Follow Up Recommendations Home health PT;Supervision - Intermittent    Equipment Recommendations  None recommended by PT    Recommendations for Other Services       Precautions / Restrictions Precautions Precautions: Fall      Mobility  Bed Mobility Overal bed mobility: Needs Assistance Bed Mobility: Sit to Supine       Sit to supine: Supervision        Transfers Overall transfer level: Needs assistance Equipment used: Rolling walker (2 wheeled) Transfers: Sit to/from Stand Sit to Stand: Supervision         General transfer comment: performed x 2 and performed toielting ADLs with supervision  Ambulation/Gait Ambulation/Gait assistance: Supervision;Min guard Gait Distance (Feet): 90 Feet Assistive device: Rolling walker (2 wheeled) Gait Pattern/deviations: Step-through pattern;Decreased stride length Gait velocity: decreased   General Gait Details: min guard progressed to supervision; mild decreased in stance  time on L; ambulated in room around obstacles and multiple turns  Financial trader Rankin (Stroke Patients Only)       Balance Overall balance assessment: Needs assistance Sitting-balance support: No upper extremity supported Sitting balance-Leahy Scale: Good     Standing balance support: No upper extremity supported;Bilateral upper extremity supported Standing balance-Leahy Scale: Good Standing balance comment: Used RW for ambulateion but able to stand at sink and shift weight for ADLs                             Pertinent Vitals/Pain Pain Assessment: Faces Faces Pain Scale: Hurts a little bit Pain Location: abdomen Pain Descriptors / Indicators: Sore Pain Intervention(s): Limited activity within patient's tolerance;Repositioned    Home Living Family/patient expects to be discharged to:: Private residence Living Arrangements: Alone Available Help at Discharge: Personal care attendant (personal care attendants during day) Type of Home: House Home Access: Stairs to enter Entrance Stairs-Rails: Psychiatric nurse of Steps: 4 Home Layout: One level;Other (Comment) Home Equipment: Walker - standard;Toilet riser;Grab bars - toilet;Grab bars - tub/shower;Shower seat;Bedside commode Additional Comments: caregivers Sun-Sat 8hrs  - reports they help her up in the morning and have her in pajamas and fed before they leave so that all she has to do is go to bed; states has BSC for toielting at night    Prior Function Level of Independence: Needs assistance   Gait / Transfers Assistance Needed: uses walker "mostly"; mostly household ambulation  now but prior to recent R tib/fib fracture could ambulate in community  ADL's / Homemaking Assistance Needed: patient reports caregivers help "with everything, I couldn't be there without them." can ambulate to bathroom for toileting mod I         Hand Dominance   Dominant  Hand: Right    Extremity/Trunk Assessment   Upper Extremity Assessment Upper Extremity Assessment: Defer to OT evaluation    Lower Extremity Assessment Lower Extremity Assessment: LLE deficits/detail;RLE deficits/detail RLE Deficits / Details: ROM WFL; MMT 5/5 LLE Deficits / Details: ROM WFL; MMT 4/5    Cervical / Trunk Assessment Cervical / Trunk Assessment: Normal  Communication   Communication: No difficulties  Cognition Arousal/Alertness: Awake/alert Behavior During Therapy: WFL for tasks assessed/performed Overall Cognitive Status: Within Functional Limits for tasks assessed                                        General Comments      Exercises     Assessment/Plan    PT Assessment Patient needs continued PT services  PT Problem List Decreased strength;Decreased mobility;Decreased activity tolerance;Decreased balance;Decreased knowledge of use of DME;Pain       PT Treatment Interventions DME instruction;Therapeutic activities;Modalities;Gait training;Therapeutic exercise;Patient/family education;Stair training;Balance training;Functional mobility training    PT Goals (Current goals can be found in the Care Plan section)  Acute Rehab PT Goals Patient Stated Goal: return home; does not want to go to rehab PT Goal Formulation: With patient/family Time For Goal Achievement: 03/22/20 Potential to Achieve Goals: Good    Frequency Min 3X/week   Barriers to discharge Decreased caregiver support      Co-evaluation               AM-PAC PT "6 Clicks" Mobility  Outcome Measure Help needed turning from your back to your side while in a flat bed without using bedrails?: None Help needed moving from lying on your back to sitting on the side of a flat bed without using bedrails?: A Little Help needed moving to and from a bed to a chair (including a wheelchair)?: None Help needed standing up from a chair using your arms (e.g., wheelchair or bedside  chair)?: None Help needed to walk in hospital room?: None Help needed climbing 3-5 steps with a railing? : A Little 6 Click Score: 22    End of Session   Activity Tolerance: Patient tolerated treatment well Patient left: in bed;with call bell/phone within reach;with bed alarm set;with family/visitor present Nurse Communication: Mobility status PT Visit Diagnosis: Other abnormalities of gait and mobility (R26.89);Muscle weakness (generalized) (M62.81)    Time: 5374-8270 PT Time Calculation (min) (ACUTE ONLY): 24 min   Charges:   PT Evaluation $PT Eval Low Complexity: 1 Low PT Treatments $Gait Training: 8-22 mins        Abran Richard, PT Acute Rehab Services Pager [phone] [facility] 03/08/2020, 11:01 AM

## 2020-03-08 NOTE — Progress Notes (Signed)
Patient up to [facility] for BM & urination

## 2020-03-08 NOTE — TOC Progression Note (Signed)
Transition of Care [facility]) - Progression Note    Patient Details  Name: Clela Hagadorn Westerhold MRN: [id number] Date of Birth: [birth year]  [facility] CM/SW Contact  Purcell Mouton, RN Phone Number: 03/08/2020, 3:18 PM  Clinical Narrative:     Spoke with pt's niece Hoyle Sauer concerning discharge needs.  Pt will go home with [facility] for HHPT and 24/7 caregivers .  Referral given to [facility] in house rep.     Expected Discharge Plan: [place] Barriers to Discharge: No Barriers Identified  Expected Discharge Plan and Services Expected Discharge Plan: [place]   Discharge Planning Services: CM Consult   Living arrangements for the past 2 months: Single Family Home                           HH Arranged: PT HH Agency: [facility] Date [place]: 03/08/20 Time [place]: 1518 Representative spoke with at [place]: Amy   Social Determinants of Health ([place]) Interventions    Readmission Risk Interventions No flowsheet data found.

## 2020-03-08 NOTE — Progress Notes (Signed)
PROGRESS NOTE    [name]  TDD:[id number] DOB: May 14, 1929 DOA: 03/06/2020 PCP: Bartholome Bill, MD    Brief Narrative: 86 year old female with recently diagnosed colon cancer adenocarcinoma status post laparoscopic right hemi-colectomy and lysis of adhesions 03/06/2020 by Dr. Dema Severin.  TRH consulted for medical management.  Assessment & Plan:   Active Problems:   S/P right hemicolectomy  #1 status post laparoscopic right hemicolectomy with lysis of adhesions 03/06/2020 with acute blood loss anemia-patient received 2 units of packed with appropriate improvement in hemoglobin.    #2 hyponatremia-continue NS sodium with mild improvement to 126.    #3 type 2 diabetes on Lantus at home.  Continue SSI. CBG (last 3)  Recent Labs    03/07/20 1706 03/07/20 2131 03/08/20 0748  GLUCAP 181* 127* 147*    #4 history of essential hypertension -her blood pressure has improved.  Continue antihypertensives. Echo from 12/14/2019-normal ejection fraction  #5 history of hypothyroidism continue levothyroxine  #6 history of brain tumor status post surgery complicated with seizures followed by neurology as an outpatient continue home meds Dilantin and Mysoline.  #7 recent distal tibia fracture followed by Ortho.  #8 swelling of the  upper lip-patient has allergies to 62 medications listed.  She has no difficulty swallowing speaking or breathing.  Monitor closely.   Estimated body mass index is 21.74 kg/m as calculated from the following:   Height as of this encounter: 5' 7.5" (1.715 m).   Weight as of this encounter: 63.9 kg.  DVT prophylaxis: Per primary team  code Status: Full code Family Communication: None at bedside  disposition Plan:  Status is: Inpatient   Subjective: Patient sitting up in chair sister by the bedside concerned about upper lip swelling which was not there yesterday.  She denies any breathing difficulty chest pain shortness of breath cough dysphagia. She is  requesting something for itching in the vaginal area she thinks it is a fungal infection.  Objective: Vitals:   03/07/20 2129 03/08/20 0440 03/08/20 0751 03/08/20 1155  BP: (!) 144/66 138/79 129/61 (!) 141/71  Pulse: 87 83 81 85  Resp: 15 16 17    Temp: 98.5 F (36.9 C) 98.1 F (36.7 C) (!) 97.4 F (36.3 C) 97.7 F (36.5 C)  TempSrc: Oral Oral Oral Oral  SpO2: 98% 97% 95% 97%  Weight:  63.9 kg    Height:        Intake/Output Summary (Last 24 hours) at 03/08/2020 1201 Last data filed at 03/08/2020 0400 Gross per 24 hour  Intake 1382.2 ml  Output 1 ml  Net 1381.2 ml   Filed Weights   03/06/20 0822 03/07/20 0300 03/08/20 0440  Weight: 58.5 kg 58.5 kg 63.9 kg    Examination:  General exam: Appears mild distress due to pain Respiratory system: Clear to auscultation. Respiratory effort normal. Cardiovascular system: S1 & S2 heard, RRR. No JVD, murmurs, rubs, gallops or clicks. No pedal edema. Gastrointestinal system: Abdomen is distended, soft and tender. No organomegaly or masses felt. Normal bowel sounds heard.  Incision covered with intact dressing Central nervous system: Alert and oriented. No focal neurological deficits. Extremities: Symmetric 5 x 5 power. Skin: No rashes, lesions or ulcers Psychiatry: Judgement and insight appear normal. Mood & affect appropriate.     Data Reviewed: I have personally reviewed following labs and imaging studies  CBC: Recent Labs  Lab 03/04/20 1138 03/07/20 0458 03/07/20 1829 03/08/20 0617  WBC 9.0 10.1 9.0 7.8  NEUTROABS 6.2  --  7.1  --  HGB 8.2* 6.3* 9.2* 8.6*  HCT 27.1* 20.5* 28.8* 26.9*  MCV 74.7* 74.3* 78.3* 78.0*  PLT 556* 383 338 063   Basic Metabolic Panel: Recent Labs  Lab 03/04/20 1138 03/07/20 0458 03/08/20 0617  NA 130* 125* 126*  K 4.9 4.3 3.9  CL 97* 97* 97*  CO2 22 21* 18*  GLUCOSE 71 214* 170*  BUN 10 6* 5*  CREATININE 0.69 0.74 0.71  CALCIUM 9.0 7.6* 7.5*   GFR: Estimated Creatinine Clearance:  49.1 mL/min (by C-G formula based on SCr of 0.71 mg/dL). Liver Function Tests: Recent Labs  Lab 03/04/20 1138  AST 30  ALT 35  ALKPHOS 140*  BILITOT 0.4  PROT 7.0  ALBUMIN 3.7   No results for input(s): LIPASE, AMYLASE in the last 168 hours. No results for input(s): AMMONIA in the last 168 hours. Coagulation Profile: Recent Labs  Lab 03/04/20 1138  INR 1.0   Cardiac Enzymes: No results for input(s): CKTOTAL, CKMB, CKMBINDEX, TROPONINI in the last 168 hours. BNP (last 3 results) No results for input(s): PROBNP in the last 8760 hours. HbA1C: No results for input(s): HGBA1C in the last 72 hours. CBG: Recent Labs  Lab 03/07/20 0741 03/07/20 1204 03/07/20 1706 03/07/20 2131 03/08/20 0748  GLUCAP 226* 185* 181* 127* 147*   Lipid Profile: No results for input(s): CHOL, HDL, LDLCALC, TRIG, CHOLHDL, LDLDIRECT in the last 72 hours. Thyroid Function Tests: No results for input(s): TSH, T4TOTAL, FREET4, T3FREE, THYROIDAB in the last 72 hours. Anemia Panel: Recent Labs    03/07/20 0458  TIBC 321  IRON 14*   Sepsis Labs: No results for input(s): PROCALCITON, LATICACIDVEN in the last 168 hours.  Recent Results (from the past 240 hour(s))  Respiratory Panel by RT PCR (Flu A&B, Covid) - Nasopharyngeal Swab     Status: None   Collection Time: 03/06/20  8:17 AM   Specimen: Nasopharyngeal Swab  Result Value Ref Range Status   SARS Coronavirus 2 by RT PCR NEGATIVE NEGATIVE Final    Comment: (NOTE) SARS-CoV-2 target nucleic acids are NOT DETECTED.  The SARS-CoV-2 RNA is generally detectable in upper respiratoy specimens during the acute phase of infection. The lowest concentration of SARS-CoV-2 viral copies this assay can detect is 131 copies/mL. A negative result does not preclude SARS-Cov-2 infection and should not be used as the sole basis for treatment or other patient management decisions. A negative result Lundstrom occur with  improper specimen collection/handling,  submission of specimen other than nasopharyngeal swab, presence of viral mutation(s) within the areas targeted by this assay, and inadequate number of viral copies (<131 copies/mL). A negative result must be combined with clinical observations, patient history, and epidemiological information. The expected result is Negative.  Fact Sheet for Patients:  PinkCheek.be  Fact Sheet for Healthcare Providers:  GravelBags.it  This test is no t yet approved or cleared by the [place] FDA and  has been authorized for detection and/or diagnosis of SARS-CoV-2 by FDA under an Emergency Use Authorization (EUA). This EUA will remain  in effect (meaning this test can be used) for the duration of the COVID-19 declaration under Section 564(b)(1) of the Act, 21 U.S.C. section 360bbb-3(b)(1), unless the authorization is terminated or revoked sooner.     Influenza A by PCR NEGATIVE NEGATIVE Final   Influenza B by PCR NEGATIVE NEGATIVE Final    Comment: (NOTE) The Xpert Xpress SARS-CoV-2/FLU/RSV assay is intended as an aid in  the diagnosis of influenza from Nasopharyngeal swab specimens and  should not be used as a sole basis for treatment. Nasal washings and  aspirates are unacceptable for Xpert Xpress SARS-CoV-2/FLU/RSV  testing.  Fact Sheet for Patients: PinkCheek.be  Fact Sheet for Healthcare Providers: GravelBags.it  This test is not yet approved or cleared by the [place] FDA and  has been authorized for detection and/or diagnosis of SARS-CoV-2 by  FDA under an Emergency Use Authorization (EUA). This EUA will remain  in effect (meaning this test can be used) for the duration of the  Covid-19 declaration under Section 564(b)(1) of the Act, 21  U.S.C. section 360bbb-3(b)(1), unless the authorization is  terminated or revoked. Performed at [facility], [place] [address] [place], [place] [address]          Radiology Studies: No results found.      Scheduled Meds: . acetaminophen  1,000 mg Oral Q6H  . cholestyramine light  0.5 packet Oral QHS  . escitalopram  10 mg Oral Daily  . feeding supplement  237 mL Oral BID BM  . insulin aspart  0-5 Units Subcutaneous QHS  . insulin aspart  0-9 Units Subcutaneous TID WC  . levothyroxine  75 mcg Oral Q0600  . phenytoin  50 mg Oral Daily  . phenytoin  100 mg Oral Daily  . phenytoin  200 mg Oral QHS  . polyvinyl alcohol  1 drop Both Eyes TID  . primidone  100 mg Oral QPC supper  . primidone  150 mg Oral q morning - 10a  . thiamine  250 mg Oral QHS  . verapamil  120 mg Oral QHS   Continuous Infusions: . sodium chloride 75 mL/hr at 03/08/20 0518     LOS: 2 days   Georgette Shell, MD 03/08/2020, 12:01 PM

## 2020-03-08 NOTE — Discharge Instructions (Signed)
POST OP INSTRUCTIONS AFTER COLON SURGERY  1. DIET: Be sure to include lots of fluids daily to stay hydrated - 64oz of water per day (8, 8 oz glasses).  Avoid fast food or heavy meals for the first couple of weeks as your are more likely to get nauseated. Avoid raw/uncooked fruits or vegetables for the first 4 weeks (its ok to have these if they are blended into smoothie form). If you have fruits/vegetables, make sure they are cooked until soft enough to mash on the roof of your mouth and chew your food well. Otherwise, diet as tolerated.  2. Take your usually prescribed home medications unless otherwise directed.  3. PAIN CONTROL: a. Pain is best controlled by a usual combination of three different methods TOGETHER: i. Ice/Heat ii. Over the counter pain medication iii. Prescription pain medication b. Most patients will experience some swelling and bruising around the surgical site.  Ice packs or heating pads (30-60 minutes up to 6 times a day) will help. Some people prefer to use ice alone, heat alone, alternating between ice & heat.  Experiment to what works for you.  Swelling and bruising can take several weeks to resolve.   c. It is helpful to take an over-the-counter pain medication regularly for the first few weeks: i. Ibuprofen (Motrin/Advil) - 200mg tabs - take 3 tabs (600mg) every 6 hours as needed for pain (unless you have been directed previously to avoid NSAIDs/ibuprofen) ii. Acetaminophen (Tylenol) - you Pettry take 650mg every 6 hours as needed. You can take this with motrin as they act differently on the body. If you are taking a narcotic pain medication that has acetaminophen in it, do not take over the counter tylenol at the same time. iii. NOTE: You Teffeteller take both of these medications together - most patients  find it most helpful when alternating between the two (i.e. Ibuprofen at 6am, tylenol at 9am, ibuprofen at 12pm ...) d. A  prescription for pain medication should be given to you  upon discharge.  Take your pain medication as prescribed if your pain is not adequatly controlled with the over-the-counter pain reliefs mentioned above.  4. Avoid getting constipated.  Between the surgery and the pain medications, it is common to experience some constipation.  Increasing fluid intake and taking a fiber supplement (such as Metamucil, Citrucel, FiberCon, MiraLax, etc) 1-2 times a day regularly will usually help prevent this problem from occurring.  A mild laxative (prune juice, Milk of Magnesia, MiraLax, etc) should be taken according to package directions if there are no bowel movements after 48 hours.    5. Dressing: Your incisions are covered in Dermabond which is like sterile superglue for the skin. This will come off on it's own in a couple weeks. It is waterproof and you [place]han bathe normally starting the day after your surgery in a shower. Avoid baths/pools/lakes/oceans until your wounds have fully healed.  6. ACTIVITIES as tolerated:   a. Avoid heavy lifting (>10lbs or 1 gallon of milk) for the next 6 weeks. b. You Douglass resume regular daily activities as tolerated--such as daily self-care, walking, climbing stairs--gradually increasing activities as tolerated.  If you can walk 30 minutes without difficulty, it is safe to try more intense activity such as jogging, treadmill, bicycling, low-impact aerobics.  c. DO NOT PUSH THROUGH PAIN.  Let pain be your guide: If it hurts to do something, don't do it. d. You [address] drive when you are no longer taking prescription pain medication, you   can comfortably wear a seatbelt, and you can safely maneuver your car and apply brakes.  7. FOLLOW UP in our office a. Please call [facility] at [phone] to set up an appointment to see your surgeon in the office for a follow-up appointment approximately 2 weeks after your surgery. b. Make sure that you call for this appointment the day you arrive home to insure a convenient appointment time.  9. If you  have disability or family leave forms that need to be completed, you Housey have them completed by your primary care physician's office; for return to work instructions, please ask our office staff and they will be happy to assist you in obtaining this documentation   When to call us [phone]: 1. Poor pain control 2. Reactions / problems with new medications (rash/itching, etc)  3. Fever over 101.5 F (38.5 C) 4. Inability to urinate 5. Nausea/vomiting 6. Worsening swelling or bruising 7. Continued bleeding from incision. 8. Increased pain, redness, or drainage from the incision  The clinic staff is available to answer your questions during regular business hours (8:30am-5pm).  Please don't hesitate to call and ask to speak to one of our nurses for clinical concerns.   A surgeon from [facility] is always on call at the hospitals   If you have a medical emergency, go to the nearest emergency room or call 911.  [facility] [address], [address], [place], [place]  [address] MAIN: [phone] FAX: [phone] [email or web address] 

## 2020-03-08 NOTE — Progress Notes (Signed)
Occupational Therapy Evaluation  Patient with functional deficits listed below impacting safety and independence with self care. At baseline patient has caregivers Sun-Sat 8hr/day to assist with "whatever I need." Patient utilizes BSC at night time mod I. Currently patient min A for bed mobility, min G for functional transfer and min A for LB ADL due to abdominal pain and decreased activity tolerance. Recommend continued acute OT services in order to facilitate D/C to venue listed below.     03/08/20 1300  OT Visit Information  Last OT Received On 03/08/20  Assistance Needed +1  History of Present Illness Pt is 86 yo female with hx of HTN, DM, breast CA , and seizures.  She had recent L tib/fib (01/2020) fracture but reports she has been cleared for WBAT. Pt presented to hospital for surgery for colon CA and is s/p laparoscopic R hemicolectomy on 03/06/20.  Precautions  Precautions Fall  Restrictions  Weight Bearing Restrictions No  Home Living  Family/patient expects to be discharged to: Private residence  Living Arrangements Alone  Available Help at Discharge Personal care attendant  Type of [place] to enter  Entrance Stairs-Number of Steps [place] One level;Other (Comment)  Bathroom Shower/Tub Walk-in Patent examiner - standard;Toilet riser;Grab bars - toilet;Grab bars - tub/shower;Shower seat;BSC  Additional Comments caregivers Sun-Sat 8hrs  - reports they help her up in the morning and have her in pajamas and fed before they leave so that all she has to do is go to bed; states has BSC for toielting at night  Prior Function  Level of Independence Needs assistance  Gait / Transfers Assistance Needed uses walker "mostly"; mostly household ambulation now but prior to recent R tib/fib fracture could ambulate in community  ADL's / [place] patient reports caregivers  help "with everything, I couldn't be there without them." can ambulate to bathroom for toileting mod I   Communication  Communication No difficulties  Pain Assessment  Pain Assessment 0-10  Pain Score 5  Pain Location abdomen  Pain Descriptors / Indicators Sore  Pain Intervention(s) Monitored during session  Cognition  Arousal/Alertness Awake/alert  Behavior During Therapy [facility] for tasks assessed/performed  Overall Cognitive Status Within Functional Limits for tasks assessed  Upper Extremity Assessment  Upper Extremity Assessment Generalized weakness  Lower Extremity Assessment  Lower Extremity Assessment Defer to PT evaluation  Cervical / Trunk Assessment  Cervical / Trunk Assessment Normal  ADL  Overall ADL's  Needs assistance/impaired  Grooming Wash/dry face;Set up;Sitting  Upper Body Bathing Set up;Sitting  Lower Body Bathing Minimal assistance;Sitting/lateral leans;Sit to/from stand  Upper Body Dressing  Set up;Sitting  Lower Body Dressing Minimal assistance;Sitting/lateral leans;Sit to/from stand  Lower Body Dressing Details (indicate cue type and reason) patient able to reach socks to pull up seated EOB  Toilet Transfer Stand-pivot;RW;BSC;Min Training and development officer Details (indicate cue type and reason) to recliner, min G  for steadying  Toileting- Clothing Manipulation and Hygiene Minimal assistance;Sit to/from stand  Functional mobility during ADLs Minimal assistance;Rolling walker  General ADL Comments patient requiring assistance with self care tasks due to decreased activity tolerance, strength and abdominal pain  Bed Mobility  Overal bed mobility Needs Assistance  Bed Mobility Rolling;Sidelying to Sit  Rolling Supervision  Sidelying to sit Min assist  General bed mobility comments min A to elevate trunk  Transfers  Overall transfer level Needs assistance  Equipment used Rolling  walker (2 wheeled)  Transfers Sit to/from Stand  Sit to Stand Min guard  General  transfer comment min G for steadying  Balance  Overall balance assessment Needs assistance  Sitting-balance support No upper extremity supported  Sitting balance-Leahy Scale Good  Standing balance support Bilateral upper extremity supported  Standing balance-Leahy Scale Fair  Standing balance comment able to stand statically without UE support, uses walker for transfer  OT - End of Session  Equipment Utilized During Treatment Rolling walker  Activity Tolerance Patient tolerated treatment well  Patient left in chair;with call bell/phone within reach;with chair alarm set  Nurse Communication Mobility status;Patient requests pain meds  OT Assessment  OT Recommendation/Assessment Patient needs continued OT Services  OT Visit Diagnosis Other abnormalities of gait and mobility (R26.89);Pain  Pain - part of body  (abdomen)  OT Problem List Decreased strength;Decreased activity tolerance;Impaired balance (sitting and/or standing);Pain  OT Plan  OT Frequency (ACUTE ONLY) Min 2X/week  OT Treatment/Interventions (ACUTE ONLY) Self-care/ADL training;Energy conservation;DME and/or AE instruction;Therapeutic activities;Balance training;Patient/family education  AM-PAC OT "6 Clicks" Daily Activity Outcome Measure (Version 2)  Help from another person eating meals? 4  Help from another person taking care of personal grooming? 3  Help from another person toileting, which includes using toliet, bedpan, or urinal? 3  Help from another person bathing (including washing, rinsing, drying)? 3  Help from another person to put on and taking off regular upper body clothing? 3  Help from another person to put on and taking off regular lower body clothing? 3  6 Click Score 19  OT Recommendation  Follow Up Recommendations Home health OT  OT Equipment None recommended by OT  Individuals Consulted  Consulted and Agree with Results and Recommendations Patient  Acute Rehab OT Goals  Patient Stated Goal return  home; does not want to go to rehab  OT Goal Formulation With patient  Time For Goal Achievement 03/22/20  Potential to Achieve Goals Good  OT Time Calculation  OT Start Time (ACUTE ONLY) 0738  OT Stop Time (ACUTE ONLY) 0807  OT Time Calculation (min) 29 min  OT General Charges  $OT Visit 1 Visit  OT Evaluation  $OT Eval Low Complexity 1 Low  OT Treatments  $Self Care/Home Management  8-22 mins  Written Expression  Dominant Hand Right   Delbert Phenix OT OT pager: [phone]

## 2020-03-08 NOTE — Progress Notes (Addendum)
Subjective No acute events. "feeling much better" after blood transfusion. Denies n/v overnight. Had some nausea yesterday. Having multiple BMs and passing flatus. Abdominal bloating ~resolved. Denies any abdominal pain. Denies blood in stool at present  Objective: Vital signs in last 24 hours: Temp:  [97.8 F (36.6 C)-98.5 F (36.9 C)] 98.1 F (36.7 C) (11/05 0440) Pulse Rate:  [83-92] 83 (11/05 0440) Resp:  [13-16] 16 (11/05 0440) BP: (109-144)/(49-79) 138/79 (11/05 0440) SpO2:  [95 %-100 %] 97 % (11/05 0440) Weight:  [63.9 kg] 63.9 kg (11/05 0440) Last BM Date: 03/07/20  Intake/Output from previous day: 11/04 0701 - 11/05 0700 In: 1382.2 [I.V.:752.2; Blood:630] Out: 1 [Stool:1] Intake/Output this shift: No intake/output data recorded.  Gen: NAD, comfortable CV: RRR Pulm: Normal work of breathing Abd: Soft, nontender throughout, nondistended. No rebound nor guarding. Incisions c/d/i without erythema or drainage. Ext: SCDs in place  Lab Results: CBC  Recent Labs    03/07/20 1829 03/08/20 0617  WBC 9.0 7.8  HGB 9.2* 8.6*  HCT 28.8* 26.9*  PLT 338 290   BMET Recent Labs    03/07/20 0458 03/08/20 0617  NA 125* 126*  K 4.3 3.9  CL 97* 97*  CO2 21* 18*  GLUCOSE 214* 170*  BUN 6* 5*  CREATININE 0.74 0.71  CALCIUM 7.6* 7.5*   PT/INR No results for input(s): LABPROT, INR in the last 72 hours. ABG No results for input(s): PHART, HCO3 in the last 72 hours.  Invalid input(s): PCO2, PO2  Studies/Results:  Anti-infectives: Anti-infectives (From admission, onward)   Start     Dose/Rate Route Frequency Ordered Stop   03/06/20 1400  neomycin (MYCIFRADIN) tablet 1,000 mg  Status:  Discontinued       "And" Linked Group Details   1,000 mg Oral 3 times per day 03/06/20 0816 03/06/20 0829   03/06/20 1400  metroNIDAZOLE (FLAGYL) tablet 1,000 mg  Status:  Discontinued       "And" Linked Group Details   1,000 mg Oral 3 times per day 03/06/20 0816 03/06/20 0829    03/06/20 0832  clindamycin (CLEOCIN) 900 MG/50ML IVPB       Note to Pharmacy: Charmayne Sheer   : cabinet override      03/06/20 0832 03/06/20 1047   03/06/20 0600  gentamicin (GARAMYCIN) 280 mg in dextrose 5 % 100 mL IVPB       "And" Linked Group Details   280 mg 107 mL/hr over 60 Minutes Intravenous On call to O.R. 03/05/20 0845 03/06/20 1014   03/05/20 0845  clindamycin (CLEOCIN) IVPB 900 mg       "And" Linked Group Details   900 mg 100 mL/hr over 30 Minutes Intravenous 60 min pre-op 03/05/20 0845 03/06/20 1053       Assessment/Plan: Patient Active Problem List   Diagnosis Date Noted  . S/P right hemicolectomy 03/06/2020  . Gait abnormality 12/04/2019  . Hypokalemia   . Effusion of left knee joint   . Hyponatremia   . Left patella fracture 10/26/2018  . Acquired hypothyroidism 08/25/2017  . 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 Hammer toe 08/25/2017  . History of breast cancer 08/25/2017  . Nontoxic thyroid nodule 08/25/2017  . Osteopetrosis 08/25/2017  . Dysuria 01/19/2017  . Vitamin D deficiency 11/18/2016  . Tremor, essential 08/18/2016  . Convulsions/seizures ([place]) 10/16/2014  . Brain tumor (benign) ([place]) 10/18/2013  . Subdural hemorrhage ([place]) 10/09/2013  . Anxiety 10/09/2013  . Compression fracture of T12 vertebra ([place]) 10/09/2013  .  Ejection fraction   . Multiple thyroid nodules 09/05/2013  . Syncope 08/27/2013  . Carotid artery disease ([place]) 08/27/2013  . Abnormal thyroid ultrasound 08/27/2013  . Cancer of right breast ([place])   . Cancer of central portion of female breast ([place]) 12/30/2012  . Osteoporosis 03/06/2010  . ASTHMA 03/05/2009  . IRRITABLE BOWEL SYNDROME  diarrhea type 03/21/2008  . ANEMIA-NOS 12/16/2006  . GERD 12/16/2006  . Insulin dependent diabetes mellitus 10/29/2006  . Hyperlipidemia 10/29/2006  . Essential hypertension 10/29/2006  . Allergic rhinitis, cause unspecified 10/29/2006  . OSTEOARTHRITIS 10/29/2006   s/p  Procedure(s): LAPAROSCOPIC RIGHT HEMI COLECTOMY WITH TAP BLOCK AND LYSIS OF ADHESIONS 03/06/2020  -Will trial clear liquids today -Continue MIVF until reliably tolerating oral intake -Hgb responded well to transfusion; monitor -Ambulate with therapies -PPx: SCDs; if hgb remains stable tomorrow, will start back on chemical dvt ppx -Dispo: Plans to return to SNF where she resided prior to surgery once clear for discharge   LOS: 2 days   Sharon Mt. Dema Severin, M.D. [facility] Use [email or web address] to contact on call provider

## 2020-03-08 NOTE — Progress Notes (Signed)
I have attempted to reach out to her niece Hoyle Sauer to keep her in the loop regarding her progress but have been unsuccessful at reaching her on numerous attempts  Nadeen Landau, M.D. [facility]

## 2020-03-09 DIAGNOSIS — C182 Malignant neoplasm of ascending colon: Principal | ICD-10-CM

## 2020-03-09 DIAGNOSIS — Z9189 Other specified personal risk factors, not elsewhere classified: Secondary | ICD-10-CM

## 2020-03-09 DIAGNOSIS — Z889 Allergy status to unspecified drugs, medicaments and biological substances status: Secondary | ICD-10-CM

## 2020-03-09 LAB — BASIC METABOLIC PANEL
Anion gap: 8 (ref 5–15)
BUN: 5 mg/dL — ABNORMAL LOW (ref 8–23)
CO2: 21 mmol/L — ABNORMAL LOW (ref 22–32)
Calcium: 7.7 mg/dL — ABNORMAL LOW (ref 8.9–10.3)
Chloride: 100 mmol/L (ref 98–111)
Creatinine, Ser: 0.63 mg/dL (ref 0.44–1.00)
GFR, Estimated: >60 mL/min (ref >60)
Glucose, Bld: 171 mg/dL — ABNORMAL HIGH (ref 70–99)
Potassium: 3.5 mmol/L (ref 3.5–5.1)
Sodium: 129 mmol/L — ABNORMAL LOW (ref 135–145)

## 2020-03-09 LAB — GLUCOSE, CAPILLARY
Glucose-Capillary: 165 mg/dL — ABNORMAL HIGH (ref 70–99)
Glucose-Capillary: 253 mg/dL — ABNORMAL HIGH (ref 70–99)
Glucose-Capillary: 211 mg/dL — ABNORMAL HIGH (ref 70–99)
Glucose-Capillary: 214 mg/dL — ABNORMAL HIGH (ref 70–99)

## 2020-03-09 LAB — CBC
HCT: 28 % — ABNORMAL LOW (ref 36.0–46.0)
Hemoglobin: 9 g/dL — ABNORMAL LOW (ref 12.0–15.0)
MCH: 24.9 pg — ABNORMAL LOW (ref 26.0–34.0)
MCHC: 32.1 g/dL (ref 30.0–36.0)
MCV: 77.3 fL — ABNORMAL LOW (ref 80.0–100.0)
Platelets: 338 10*3/uL (ref 150–400)
RBC: 3.62 MIL/uL — ABNORMAL LOW (ref 3.87–5.11)
RDW: 18.3 % — ABNORMAL HIGH (ref 11.5–15.5)
WBC: 7.9 10*3/uL (ref 4.0–10.5)
nRBC: 0 % (ref 0.0–0.2)

## 2020-03-09 MED ORDER — SODIUM CHLORIDE 0.9% FLUSH: 3.0000 mL | INTRAVENOUS | Status: DC | PRN

## 2020-03-09 MED ORDER — ASCORBIC ACID 500 MG PO TABS
500.0000 mg | ORAL_TABLET | Freq: Two times a day (BID) | ORAL | Status: DC
  Administered 2020-03-09 – 2020-03-12 (×7): 500 mg via ORAL
  Filled 2020-03-09 (×7): qty 1

## 2020-03-09 MED ORDER — HYDROCORTISONE (PERIANAL) 2.5 % EX CREA
1.0000 "application " | TOPICAL_CREAM | Freq: Four times a day (QID) | CUTANEOUS | Status: DC | PRN
  Filled 2020-03-09: qty 28.35

## 2020-03-09 MED ORDER — SODIUM CHLORIDE 0.9% FLUSH
3.0000 mL | Freq: Two times a day (BID) | INTRAVENOUS | Status: DC
  Administered 2020-03-09 – 2020-03-11 (×5): 3 mL via INTRAVENOUS

## 2020-03-09 MED ORDER — ALUM & MAG HYDROXIDE-SIMETH 200-200-20 MG/5ML PO SUSP: 30.0000 mL | Freq: Four times a day (QID) | ORAL | Status: DC | PRN

## 2020-03-09 MED ORDER — LACTATED RINGERS IV BOLUS: 1000.0000 mL | Freq: Three times a day (TID) | INTRAVENOUS | Status: AC | PRN

## 2020-03-09 MED ORDER — LIP MEDEX EX OINT
1.0000 "application " | TOPICAL_OINTMENT | Freq: Two times a day (BID) | CUTANEOUS | Status: DC
  Administered 2020-03-09 – 2020-03-12 (×7): 1 via TOPICAL
  Filled 2020-03-09 (×2): qty 7

## 2020-03-09 MED ORDER — HYDROCORTISONE 1 % EX CREA
1.0000 "application " | TOPICAL_CREAM | Freq: Three times a day (TID) | CUTANEOUS | Status: DC | PRN
  Filled 2020-03-09: qty 28

## 2020-03-09 MED ORDER — PSYLLIUM 95 % PO PACK
1.0000 | PACK | Freq: Every day | ORAL | Status: DC
  Filled 2020-03-09: qty 1

## 2020-03-09 MED ORDER — ENOXAPARIN SODIUM 40 MG/0.4ML ~~LOC~~ SOLN
40.0000 mg | SUBCUTANEOUS | Status: DC
  Administered 2020-03-09 – 2020-03-12 (×4): 40 mg via SUBCUTANEOUS
  Filled 2020-03-09 (×4): qty 0.4

## 2020-03-09 MED ORDER — METHOCARBAMOL 500 MG PO TABS
1000.0000 mg | ORAL_TABLET | Freq: Four times a day (QID) | ORAL | Status: DC | PRN
  Administered 2020-03-10: 1000 mg via ORAL
  Filled 2020-03-09 (×2): qty 2

## 2020-03-09 MED ORDER — MAGIC MOUTHWASH
15.0000 mL | Freq: Four times a day (QID) | ORAL | Status: DC | PRN
  Filled 2020-03-09: qty 15

## 2020-03-09 MED ORDER — OXYCODONE HCL 5 MG PO TABS
5.0000 mg | ORAL_TABLET | ORAL | Status: DC | PRN
  Administered 2020-03-09: 5 mg via ORAL
  Administered 2020-03-09: 10 mg via ORAL
  Administered 2020-03-10 – 2020-03-11 (×3): 5 mg via ORAL
  Filled 2020-03-09 (×2): qty 1
  Filled 2020-03-09: qty 2
  Filled 2020-03-09 (×2): qty 1

## 2020-03-09 MED ORDER — GUAIFENESIN-DM 100-10 MG/5ML PO SYRP: 10.0000 mL | ORAL_SOLUTION | ORAL | Status: DC | PRN

## 2020-03-09 MED ORDER — METHOCARBAMOL 1000 MG/10ML IJ SOLN
1000.0000 mg | Freq: Four times a day (QID) | INTRAVENOUS | Status: DC | PRN
  Filled 2020-03-09: qty 10

## 2020-03-09 MED ORDER — SODIUM CHLORIDE 0.9 % IV SOLN: 250.0000 mL | INTRAVENOUS | Status: DC | PRN

## 2020-03-09 MED ORDER — GLUCERNA SHAKE PO LIQD
237.0000 mL | Freq: Two times a day (BID) | ORAL | Status: DC
  Administered 2020-03-09 – 2020-03-11 (×4): 237 mL via ORAL
  Filled 2020-03-09 (×8): qty 237

## 2020-03-09 MED ORDER — ACETAMINOPHEN 500 MG PO TABS: 1000.0000 mg | ORAL_TABLET | Freq: Three times a day (TID) | ORAL | Status: DC

## 2020-03-09 MED ORDER — MENTHOL 3 MG MT LOZG: 1.0000 | LOZENGE | OROMUCOSAL | Status: DC | PRN

## 2020-03-09 MED ORDER — PSYLLIUM 95 % PO PACK
1.0000 | PACK | Freq: Two times a day (BID) | ORAL | Status: DC
  Administered 2020-03-11 – 2020-03-12 (×2): 1 via ORAL
  Filled 2020-03-09 (×7): qty 1

## 2020-03-09 MED ORDER — PHENOL 1.4 % MT LIQD: 1.0000 | OROMUCOSAL | Status: DC | PRN

## 2020-03-09 NOTE — Progress Notes (Addendum)
[name] [id number] [birth year]  CARE TEAM:  PCP: Bartholome Bill, MD  Outpatient Care Team: Patient Care Team: Bartholome Bill, MD as PCP - General (Family Medicine)  Inpatient Treatment Team: Treatment Team: Attending Provider: Ileana Roup, MD; Technician: Ernest Mallick, [place]; Rounding Team: Fatima Blank, MD; Technician: Gerhard Perches, NT; Registered Nurse: Arturo Morton, RN; Utilization Review: Alease Medina, RN; Registered Nurse: Meryle Ready, RN; Technician: Angie Fava, NT   Problem List:   Principal Problem:   Cancer of ascending colon pT3pN0 (0/12 LN) s/p lap right colectomy 03/06/2020 Active Problems:   Insulin-requiring or dependent type II diabetes mellitus ([place])   GERD   IRRITABLE BOWEL SYNDROME  diarrhea type   Anxiety   Convulsions/seizures ([place])   Gait abnormality   S/P right hemicolectomy   3 Days Post-Op  03/06/2020  PREOP DIAGNOSIS: Colon cancer  POSTOP DIAGNOSIS: Same  PROCEDURE:  1. Laparoscopic right hemicolectomy 2. Laparoscopic lysis of adhesions x 120 minutes 3. Bilateral transversus abdominus plane blocks  SURGEON: Sharon Mt. White, MD  FINDINGS:  No obvious metastatic disease on visceral parietal peritoneum or liver. Mass in proximal ascending colon. Significant adhesions of small bowel to small bowel as well as mesentery, tethered in pelvis from prior surgery. Right hemicolectomy carried out - small polyp in cecum, large mass in proximal ascending colon.  Assessment  OK  [facility] Stay = 3 days)  Plan:  -adv diet -supplement nutrition -pathology pT3pN0 -most likely no post adjuvant therapy but can discuss with Dr. Dema Severin in office -try to wean off IVF w PRN backup -try to improve pain control -patient prefers IV meds and claims allergies to numerous medications -Diabetic control with insulin -Hypertension control -Continue levothyroxine for hypothyroidism -VTE prophylaxis- SCDs,  etc -mobilize as tolerated to help recovery  -Disposition planning.  Initially plan was to go back to skilled nursing facility but Dr. Dema Severin notes family leaning towards.  Hopefully can transition on Monday if gradually improves  25 minutes spent in review, evaluation, examination, counseling, and coordination of care.  More than 50% of that time was spent in counseling.  03/09/2020    Subjective: (Chief complaint)  Patient with soreness.  Prefers IV medications.  Not much appetite but tolerating some liquids  Objective:  Vital signs:  Vitals:   03/08/20 1624 03/08/20 2000 03/09/20 0000 03/09/20 0501  BP: 139/67 131/65  133/71  Pulse: 84 86  76  Resp: '16  20 16  ' Temp: 98.8 F (37.1 C) 98.2 F (36.8 C)  98.3 F (36.8 C)  TempSrc: Oral Oral  Oral  SpO2: 96% 93%  95%  Weight:      Height:        Last BM Date: 03/08/20  Intake/Output   Yesterday:  11/05 0701 - 11/06 0700 In: 240 [P.O.:240] Out: -  This shift:  No intake/output data recorded.  Bowel function:  Flatus: YES  BM:  YES  Drain: (No drain)   Physical Exam:  General: Pt awake/alert in mild acute distress.  Somewhat cachectic but not toxic Eyes: PERRL, normal EOM.  Sclera clear.  No icterus Neuro: CN II-XII intact w/o focal sensory/motor deficits. Lymph: No head/neck/groin lymphadenopathy Psych:  No delerium/psychosis/paranoia.  Oriented x 3 HENT: Normocephalic, Mucus membranes moist.  No thrush Neck: Supple, No tracheal deviation.  No obvious thyromegaly Chest: No pain to chest wall compression.  Good respiratory excursion.  No audible wheezing CV:  Pulses intact.  Regular rhythm.  No major extremity  edema MS: Normal AROM mjr joints.  No obvious deformity  Abdomen: Soft.  Nondistended.  Mildly tender at incisions only.  No evidence of peritonitis.  No incarcerated hernias.  Ext:  No mjr edema.  No cyanosis Skin: No petechiae / purpurea.  No major sores.  Warm and dry    Results:    SURGICAL PATHOLOGY  CASE: [id number]  PATIENT: [name]  Surgical Pathology Report      Clinical History: Colon cancer (crm)      FINAL MICROSCOPIC DIAGNOSIS:   A. COLON, RIGHT, RESECTION:  - Invasive moderately differentiated adenocarcinoma, 4.7 cm, involving  proximal ascending colon  - Carcinoma invades into pericolonic soft tissue  - Negative for lymphovascular or perineural invasion  - Resection margins are negative for carcinoma  - Twelve lymph nodes, negative for carcinoma (0/12)  - A separate tubular adenoma without high-grade dysplasia  - See oncology table     ONCOLOGY TABLE:   COLON AND RECTUM: Resection, Including Transanal Disk Excision of  Rectal Neoplasms   Procedure: Right colon, resection  Tumor Site: Proximal ascending colon  Tumor Size: 4.7 cm  Macroscopic Tumor Perforation: Not identified  Histologic Type: Adenocarcinoma  Histologic Grade: G2: Moderately differentiated  Tumor Extension: Tumor invades pericolonic soft tissue  Margins: Uninvolved by tumor  Treatment Effect: No known presurgical therapy  Lymphovascular Invasion: Not identified  Perineural Invasion: Not identified  Tumor Deposits: Not identified  Regional Lymph Nodes:    Number of Lymph Nodes Involved: 0    Number of Lymph Nodes Examined: 12  Pathologic Stage Classification (pTNM, AJCC 8th Edition): pT3, pN0  Ancillary Studies: MMR / MSI testing will be ordered.  Representative Tumor Block: A6  Comments: None   (v4.1.0.0)      Omarr Hann DESCRIPTION:   Specimen: Right colon, received fresh  Specimen integrity: Intact  Specimen length: The specimen consists of 8 cm of terminal ileum and 22  cm of colon.  Tumor location: Proximal ascending colon  Tumor size: The tumor consists of a 4.7 x 2.2 cm sessile, focally  ulcerated, tan-red polypoid mass which measures 1.2 cm in maximum  thickness. The serosa is inked at the tumor.  Percent of bowel circumference  involved: 75%  Tumor distance to margins:            Proximal: 11 cm            Distal:14 cm            Radial (posterior ascending, posterior descending;  lateral and posterior mid-rectum; and entire lower 1/3 rectum): 8.5 cm  Macroscopic extent of tumor invasion: The tumor involves full-thickness  of the wall but does not grossly appear to extend beyond the wall.  Total presumed lymph nodes: There are 12 rubbery ovoid nodules grossly  consistent with lymph nodes which vary in size from 0.3 to 1.2 cm in  greatest dimension.  Extramural satellite tumor nodules: Not grossly identified  Mucosal polyp(s): There is a 1.0 x 0.9 x 0.7 cm sessile tan-red mucosal  polyp in the cecum.  Additional findings: The remainder the mucosa is glistening and tan.  The appendix is not present.  Block summary:  11 blocks submitted  1 = proximal margin  2 = distal margin  3-6 = tumor  7 = cecum polyp  8 = 1 sectioned lymph node  9 = 1 sectioned lymph node  10 = 6 whole lymph nodes  11 = 4 whole lymph nodes (GRP 03/07/2020)  Final Diagnosis performed by Jaquita Folds, MD.  Electronically  signed 03/08/2020  Technical and / or Professional components performed at [facility], [place] [address] [place], [place] [address].  Immunohistochemistry Technical component (if applicable) was performed  at [facility]. [address] [place],  [place], [place] [address].  IMMUNOHISTOCHEMISTRY DISCLAIMER (if applicable):  Some of these immunohistochemical stains Tromb[place] have been developed and the  performance characteristics determine by [facility]. Some  [place] not have been cleared or approved by the U.S. Food and Drug  Administration. The FDA has determined that such clearance or approval  is not necessary. This test is used for clinical purposes. It should not  be regarded as investigational or for research. This  laboratory is  certified under the [place]  (CLIA-88) as qualified to perform high complexity clinical laboratory  testing. The controls stained appropriately.   Cultures: Recent Results (from the past 720 hour(s))  Respiratory Panel by RT PCR (Flu A&B, Covid) - Nasopharyngeal Swab     Status: None   Collection Time: 03/06/20  8:17 AM   Specimen: Nasopharyngeal Swab  Result Value Ref Range Status   SARS Coronavirus 2 by RT PCR NEGATIVE NEGATIVE Final    Comment: (NOTE) SARS-CoV-2 target nucleic acids are NOT DETECTED.  The SARS-CoV-2 RNA is generally detectable in upper respiratoy specimens during the acute phase of infection. The lowest concentration of SARS-CoV-2 viral copies this assay can detect is 131 copies/mL. A negative result does not preclude SARS-Cov-2 infection and should not be used as the sole basis for treatment or other patient management decisions. A negative result Mendizabal occur with  improper specimen collection/handling, submission of specimen other than nasopharyngeal swab, presence of viral mutation(s) within the areas targeted by this assay, and inadequate number of viral copies (<131 copies/mL). A negative result must be combined with clinical observations, patient history, and epidemiological information. The expected result is Negative.  Fact Sheet for Patients:  PinkCheek.be  Fact Sheet for Healthcare Providers:  GravelBags.it  This test is no t yet approved or cleared by the [place] FDA and  has been authorized for detection and/or diagnosis of SARS-CoV-2 by FDA under an Emergency Use Authorization (EUA). This EUA will remain  in effect (meaning this test can be used) for the duration of the COVID-19 declaration under Section 564(b)(1) of the Act, 21 U.S.C. section 360bbb-3(b)(1), unless the authorization is terminated or revoked sooner.      Influenza A by PCR NEGATIVE NEGATIVE Final   Influenza B by PCR NEGATIVE NEGATIVE Final    Comment: (NOTE) The Xpert Xpress SARS-CoV-2/FLU/RSV assay is intended as an aid in  the diagnosis of influenza from Nasopharyngeal swab specimens and  should not be used as a sole basis for treatment. Nasal washings and  aspirates are unacceptable for Xpert Xpress SARS-CoV-2/FLU/RSV  testing.  Fact Sheet for Patients: PinkCheek.be  Fact Sheet for Healthcare Providers: GravelBags.it  This test is not yet approved or cleared by the [place] FDA and  has been authorized for detection and/or diagnosis of SARS-CoV-2 by  FDA under an Emergency Use Authorization (EUA). This EUA will remain  in effect (meaning this test can be used) for the duration of the  Covid-19 declaration under Section 564(b)(1) of the Act, 21  U.S.C. section 360bbb-3(b)(1), unless the authorization is  terminated or revoked. Performed at [facility], [place] [address], [place], [place] [address]  Labs: Results for orders placed or performed during the hospital encounter of 03/06/20 (from the past 48 hour(s))  Prepare RBC (crossmatch)     Status: None   Collection Time: 03/07/20  8:16 AM  Result Value Ref Range   Order Confirmation      BB SAMPLE OR UNITS ALREADY AVAILABLE Performed at [place] [address], [place], [place] [address]   Glucose, capillary     Status: Abnormal   Collection Time: 03/07/20 12:04 PM  Result Value Ref Range   Glucose-Capillary 185 (H) 70 - 99 mg/dL    Comment: Glucose reference range applies only to samples taken after fasting for at least 8 hours.  Glucose, capillary     Status: Abnormal   Collection Time: 03/07/20  5:06 PM  Result Value Ref Range   Glucose-Capillary 181 (H) 70 - 99 mg/dL    Comment: Glucose reference range applies only to samples taken after fasting for at least 8  hours.  CBC with Differential/Platelet     Status: Abnormal   Collection Time: 03/07/20  6:29 PM  Result Value Ref Range   WBC 9.0 4.0 - 10.5 K/uL   RBC 3.68 (L) 3.87 - 5.11 MIL/uL   Hemoglobin 9.2 (L) 12.0 - 15.0 g/dL    Comment: REPEATED TO VERIFY POST TRANSFUSION SPECIMEN DELTA CHECK NOTED    HCT 28.8 (L) 36 - 46 %   MCV 78.3 (L) 80.0 - 100.0 fL   MCH 25.0 (L) 26.0 - 34.0 pg   MCHC 31.9 30.0 - 36.0 g/dL   RDW 18.0 (H) 11.5 - 15.5 %   Platelets 338 150 - 400 K/uL   nRBC 0.0 0.0 - 0.2 %   Neutrophils Relative % 79 %   Neutro Abs 7.1 1.7 - 7.7 K/uL   Lymphocytes Relative 10 %   Lymphs Abs 0.9 0.7 - 4.0 K/uL   Monocytes Relative 8 %   Monocytes Absolute 0.7 0.1 - 1.0 K/uL   Eosinophils Relative 3 %   Eosinophils Absolute 0.3 0.0 - 0.5 K/uL   Basophils Relative 0 %   Basophils Absolute 0.0 0.0 - 0.1 K/uL   Immature Granulocytes 0 %   Abs Immature Granulocytes 0.03 0.00 - 0.07 K/uL    Comment: Performed at [facility], [place] [address], [place], [place] [address]  Glucose, capillary     Status: Abnormal   Collection Time: 03/07/20  9:31 PM  Result Value Ref Range   Glucose-Capillary 127 (H) 70 - 99 mg/dL    Comment: Glucose reference range applies only to samples taken after fasting for at least 8 hours.   Comment 1 Notify RN   CBC     Status: Abnormal   Collection Time: 03/08/20  6:17 AM  Result Value Ref Range   WBC 7.8 4.0 - 10.5 K/uL   RBC 3.45 (L) 3.87 - 5.11 MIL/uL   Hemoglobin 8.6 (L) 12.0 - 15.0 g/dL   HCT 26.9 (L) 36 - 46 %   MCV 78.0 (L) 80.0 - 100.0 fL   MCH 24.9 (L) 26.0 - 34.0 pg   MCHC 32.0 30.0 - 36.0 g/dL   RDW 17.9 (H) 11.5 - 15.5 %   Platelets 290 150 - 400 K/uL   nRBC 0.0 0.0 - 0.2 %    Comment: Performed at [facility], [place] [address], [place], [place] [address]  Basic metabolic panel     Status: Abnormal   Collection Time: 03/08/20  6:17  AM  Result Value Ref Range   Sodium 126 (L) 135 - 145 mmol/L    Potassium 3.9 3.5 - 5.1 mmol/L   Chloride 97 (L) 98 - 111 mmol/L   CO2 18 (L) 22 - 32 mmol/L   Glucose, Bld 170 (H) 70 - 99 mg/dL    Comment: Glucose reference range applies only to samples taken after fasting for at least 8 hours.   BUN 5 (L) 8 - 23 mg/dL   Creatinine, Ser 0.71 0.44 - 1.00 mg/dL   Calcium 7.5 (L) 8.9 - 10.3 mg/dL   GFR, Estimated >60 >60 mL/min    Comment: (NOTE) Calculated using the CKD-EPI Creatinine Equation (2021)    Anion gap 11 5 - 15    Comment: Performed at [facility], [place] [address] [place], [place] [address]  Glucose, capillary     Status: Abnormal   Collection Time: 03/08/20  7:48 AM  Result Value Ref Range   Glucose-Capillary 147 (H) 70 - 99 mg/dL    Comment: Glucose reference range applies only to samples taken after fasting for at least 8 hours.   Comment 1 Notify RN    Comment 2 Document in Chart   Glucose, capillary     Status: Abnormal   Collection Time: 03/08/20 11:52 AM  Result Value Ref Range   Glucose-Capillary 179 (H) 70 - 99 mg/dL    Comment: Glucose reference range applies only to samples taken after fasting for at least 8 hours.   Comment 1 Notify RN    Comment 2 Document in Chart   Glucose, capillary     Status: Abnormal   Collection Time: 03/08/20  4:19 PM  Result Value Ref Range   Glucose-Capillary 102 (H) 70 - 99 mg/dL    Comment: Glucose reference range applies only to samples taken after fasting for at least 8 hours.   Comment 1 Notify RN    Comment 2 Document in Chart   Glucose, capillary     Status: Abnormal   Collection Time: 03/08/20  8:41 PM  Result Value Ref Range   Glucose-Capillary 212 (H) 70 - 99 mg/dL    Comment: Glucose reference range applies only to samples taken after fasting for at least 8 hours.  CBC     Status: Abnormal   Collection Time: 03/09/20  5:58 AM  Result Value Ref Range   WBC 7.9 4.0 - 10.5 K/uL   RBC 3.62 (L) 3.87 - 5.11 MIL/uL   Hemoglobin 9.0 (L) 12.0 - 15.0 g/dL   HCT  28.0 (L) 36 - 46 %   MCV 77.3 (L) 80.0 - 100.0 fL   MCH 24.9 (L) 26.0 - 34.0 pg   MCHC 32.1 30.0 - 36.0 g/dL   RDW 18.3 (H) 11.5 - 15.5 %   Platelets 338 150 - 400 K/uL   nRBC 0.0 0.0 - 0.2 %    Comment: Performed at [facility], [place] [address] [place], [place] [address]  Basic metabolic panel     Status: Abnormal   Collection Time: 03/09/20  5:58 AM  Result Value Ref Range   Sodium 129 (L) 135 - 145 mmol/L   Potassium 3.5 3.5 - 5.1 mmol/L   Chloride 100 98 - 111 mmol/L   CO2 21 (L) 22 - 32 mmol/L   Glucose, Bld 171 (H) 70 - 99 mg/dL    Comment: Glucose reference range applies only to samples taken after fasting for at least 8 hours.  BUN 5 (L) 8 - 23 mg/dL   Creatinine, Ser 0.63 0.44 - 1.00 mg/dL   Calcium 7.7 (L) 8.9 - 10.3 mg/dL   GFR, Estimated >60 >60 mL/min    Comment: (NOTE) Calculated using the CKD-EPI Creatinine Equation (2021)    Anion gap 8 5 - 15    Comment: Performed at [facility], [place] [address] [place], [place] [address]  Glucose, capillary     Status: Abnormal   Collection Time: 03/09/20  7:50 AM  Result Value Ref Range   Glucose-Capillary 165 (H) 70 - 99 mg/dL    Comment: Glucose reference range applies only to samples taken after fasting for at least 8 hours.    Imaging / Studies: No results found.  Medications / Allergies: per chart  Antibiotics: Anti-infectives (From admission, onward)   Start     Dose/Rate Route Frequency Ordered Stop   03/06/20 1400  neomycin (MYCIFRADIN) tablet 1,000 mg  Status:  Discontinued       "And" Linked Group Details   1,000 mg Oral 3 times per day 03/06/20 0816 03/06/20 0829   03/06/20 1400  metroNIDAZOLE (FLAGYL) tablet 1,000 mg  Status:  Discontinued       "And" Linked Group Details   1,000 mg Oral 3 times per day 03/06/20 0816 03/06/20 0829   03/06/20 0832  clindamycin (CLEOCIN) 900 MG/50ML IVPB       Note to Pharmacy: Charmayne Sheer   : cabinet override      03/06/20  0832 03/06/20 1047   03/06/20 0600  gentamicin (GARAMYCIN) 280 mg in dextrose 5 % 100 mL IVPB       "And" Linked Group Details   280 mg 107 mL/hr over 60 Minutes Intravenous On call to O.R. 03/05/20 0845 03/06/20 1014   03/05/20 0845  clindamycin (CLEOCIN) IVPB 900 mg       "And" Linked Group Details   900 mg 100 mL/hr over 30 Minutes Intravenous 60 min pre-op 03/05/20 0845 03/06/20 1053        Note: Portions of this report [place] have been transcribed using voice recognition software. Every effort was made to ensure accuracy; however, inadvertent computerized transcription errors Rauh be present.   Any transcriptional errors that result from this process are unintentional.    Adin Hector, MD, FACS, MASCRS Gastrointestinal and Minimally Invasive Surgery  [facility] [address], [place], [place] [address] [phone] Fax [phone] Main/Paging  CONTACT INFORMATION: Weekday (9AM-5PM) concerns: Call [facility] main office at [phone] Weeknight (5PM-9AM) or Weekend/Holiday concerns: Check [email or web address] for General Surgery [facility] coverage (Please, do not use SecureChat as it is not reliable communication to operating surgeons for immediate patient care)      03/09/2020  7:53 AM

## 2020-03-09 NOTE — Progress Notes (Signed)
PROGRESS NOTE    [name]  YSA:[id number] DOB: 1929-09-25 DOA: 03/06/2020 PCP: Bartholome Bill, MD    Brief Narrative: 86 year old female with recently diagnosed colon cancer adenocarcinoma status post laparoscopic right hemi-colectomy and lysis of adhesions 03/06/2020 by Dr. Dema Severin.  TRH consulted for medical management.  Assessment & Plan:   Principal Problem:   Cancer of ascending colon pT3pN0 (0/12 LN) s/p lap right colectomy 03/06/2020 Active Problems:   Insulin-requiring or dependent type II diabetes mellitus ([place])   GERD   IRRITABLE BOWEL SYNDROME  diarrhea type   Anxiety   Convulsions/seizures (HCC)   Gait abnormality   S/P right hemicolectomy   History of multiple allergies  #1 status post laparoscopic right hemicolectomy with lysis of adhesions 03/06/2020 with acute blood loss anemia-patient received 2 units of packed with appropriate improvement in hemoglobin.  Hemoglobin 9.0.  #2 hyponatremia-improving on normal saline.  Sodium 129 from 136.  Continue normal saline 75 cc an hour.  #3 type 2 diabetes on Lantus at home.  Continue SSI.  Will need to restart home medications once she is able to take p.o. CBG (last 3)  Recent Labs    03/08/20 2041 03/09/20 0750 03/09/20 1130  GLUCAP 212* 165* 253*    #4 history of essential hypertension -blood pressure is 162/75.   Echo from 12/14/2019-normal ejection fraction  #5 history of hypothyroidism continue levothyroxine  #6 history of brain tumor status post surgery complicated with seizures followed by neurology as an outpatient continue home meds Dilantin and Mysoline.  #7 recent distal tibia fracture followed by Ortho.  #8 swelling of the  upper lip-patient has allergies to 62 medications listed.  She has no difficulty swallowing speaking or breathing.  Monitor closely.   Estimated body mass index is 21.63 kg/m as calculated from the following:   Height as of this encounter: 5' 7.5" (1.715 m).   Weight as of  this encounter: 63.6 kg.  DVT prophylaxis: Per primary team  code Status: Full code Family Communication: None at bedside  disposition Plan:  Status is: Inpatient   Subjective: She is resting in bed in no acute distress denies any nausea vomiting no difficulty in speaking or swallowing upper lip swelling decreased discussed with night staff no events overnight Objective: Vitals:   03/08/20 2000 03/09/20 0000 03/09/20 0501 03/09/20 1345  BP: 131/65  133/71 (!) 162/75  Pulse: 86  76 72  Resp:  20 16 20   Temp: 98.2 F (36.8 C)  98.3 F (36.8 C) 98.3 F (36.8 C)  TempSrc: Oral  Oral Oral  SpO2: 93%  95% 97%  Weight:    63.6 kg  Height:        Intake/Output Summary (Last 24 hours) at 03/09/2020 1434 Last data filed at 03/09/2020 1114 Gross per 24 hour  Intake 360 ml  Output --  Net 360 ml   Filed Weights   03/07/20 0300 03/08/20 0440 03/09/20 1345  Weight: 58.5 kg 63.9 kg 63.6 kg    Examination:  General exam: Appears mild distress due to pain Respiratory system: Clear to auscultation. Respiratory effort normal. Cardiovascular system: S1 & S2 heard, RRR. No JVD, murmurs, rubs, gallops or clicks. No pedal edema. Gastrointestinal system: Abdomen is distended, soft and tender. No organomegaly or masses felt. Normal bowel sounds heard.  Incision covered with intact dressing Central nervous system: Alert and oriented. No focal neurological deficits. Extremities: Symmetric 5 x 5 power. Skin: No rashes, lesions or ulcers Psychiatry: Judgement and insight appear  normal. Mood & affect appropriate.     Data Reviewed: I have personally reviewed following labs and imaging studies  CBC: Recent Labs  Lab 03/04/20 1138 03/07/20 0458 03/07/20 1829 03/08/20 0617 03/09/20 0558  WBC 9.0 10.1 9.0 7.8 7.9  NEUTROABS 6.2  --  7.1  --   --   HGB 8.2* 6.3* 9.2* 8.6* 9.0*  HCT 27.1* 20.5* 28.8* 26.9* 28.0*  MCV 74.7* 74.3* 78.3* 78.0* 77.3*  PLT 556* 383 338 290 193   Basic  Metabolic Panel: Recent Labs  Lab 03/04/20 1138 03/07/20 0458 03/08/20 0617 03/09/20 0558  NA 130* 125* 126* 129*  K 4.9 4.3 3.9 3.5  CL 97* 97* 97* 100  CO2 22 21* 18* 21*  GLUCOSE 71 214* 170* 171*  BUN 10 6* 5* 5*  CREATININE 0.69 0.74 0.71 0.63  CALCIUM 9.0 7.6* 7.5* 7.7*   GFR: Estimated Creatinine Clearance: 49.1 mL/min (by C-G formula based on SCr of 0.63 mg/dL). Liver Function Tests: Recent Labs  Lab 03/04/20 1138  AST 30  ALT 35  ALKPHOS 140*  BILITOT 0.4  PROT 7.0  ALBUMIN 3.7   No results for input(s): LIPASE, AMYLASE in the last 168 hours. No results for input(s): AMMONIA in the last 168 hours. Coagulation Profile: Recent Labs  Lab 03/04/20 1138  INR 1.0   Cardiac Enzymes: No results for input(s): CKTOTAL, CKMB, CKMBINDEX, TROPONINI in the last 168 hours. BNP (last 3 results) No results for input(s): PROBNP in the last 8760 hours. HbA1C: No results for input(s): HGBA1C in the last 72 hours. CBG: Recent Labs  Lab 03/08/20 1152 03/08/20 1619 03/08/20 2041 03/09/20 0750 03/09/20 1130  GLUCAP 179* 102* 212* 165* 253*   Lipid Profile: No results for input(s): CHOL, HDL, LDLCALC, TRIG, CHOLHDL, LDLDIRECT in the last 72 hours. Thyroid Function Tests: No results for input(s): TSH, T4TOTAL, FREET4, T3FREE, THYROIDAB in the last 72 hours. Anemia Panel: Recent Labs    03/07/20 0458  TIBC 321  IRON 14*   Sepsis Labs: No results for input(s): PROCALCITON, LATICACIDVEN in the last 168 hours.  Recent Results (from the past 240 hour(s))  Respiratory Panel by RT PCR (Flu A&B, Covid) - Nasopharyngeal Swab     Status: None   Collection Time: 03/06/20  8:17 AM   Specimen: Nasopharyngeal Swab  Result Value Ref Range Status   SARS Coronavirus 2 by RT PCR NEGATIVE NEGATIVE Final    Comment: (NOTE) SARS-CoV-2 target nucleic acids are NOT DETECTED.  The SARS-CoV-2 RNA is generally detectable in upper respiratoy specimens during the acute phase of  infection. The lowest concentration of SARS-CoV-2 viral copies this assay can detect is 131 copies/mL. A negative result does not preclude SARS-Cov-2 infection and should not be used as the sole basis for treatment or other patient management decisions. A negative result [place] occur with  improper specimen collection/handling, submission of specimen other than nasopharyngeal swab, presence of viral mutation(s) within the areas targeted by this assay, and inadequate number of viral copies (<131 copies/mL). A negative result must be combined with clinical observations, patient history, and epidemiological information. The expected result is Negative.  Fact Sheet for Patients:  PinkCheek.be  Fact Sheet for Healthcare Providers:  GravelBags.it  This test is no t yet approved or cleared by the [place] FDA and  has been authorized for detection and/or diagnosis of SARS-CoV-2 by FDA under an Emergency Use Authorization (EUA). This EUA will remain  in effect (meaning this test can be used) for  the duration of the COVID-19 declaration under Section 564(b)(1) of the Act, 21 U.S.C. section 360bbb-3(b)(1), unless the authorization is terminated or revoked sooner.     Influenza A by PCR NEGATIVE NEGATIVE Final   Influenza B by PCR NEGATIVE NEGATIVE Final    Comment: (NOTE) The Xpert Xpress SARS-CoV-2/FLU/RSV assay is intended as an aid in  the diagnosis of influenza from Nasopharyngeal swab specimens and  should not be used as a sole basis for treatment. Nasal washings and  aspirates are unacceptable for Xpert Xpress SARS-CoV-2/FLU/RSV  testing.  Fact Sheet for Patients: PinkCheek.be  Fact Sheet for Healthcare Providers: GravelBags.it  This test is not yet approved or cleared by the [place] FDA and  has been authorized for detection and/or diagnosis of SARS-CoV-2  by  FDA under an Emergency Use Authorization (EUA). This EUA will remain  in effect (meaning this test can be used) for the duration of the  Covid-19 declaration under Section 564(b)(1) of the Act, 21  U.S.C. section 360bbb-3(b)(1), unless the authorization is  terminated or revoked. Performed at [facility], [place] [address] [place], [place] [address]          Radiology Studies: No results found.      Scheduled Meds: . acetaminophen  1,000 mg Oral Q6H  . vitamin C  500 mg Oral BID  . cholestyramine light  0.5 packet Oral QHS  . clotrimazole  1 Applicatorful Vaginal QHS  . enoxaparin (LOVENOX) injection  40 mg Subcutaneous Q24H  . escitalopram  10 mg Oral Daily  . feeding supplement  237 mL Oral BID BM  . feeding supplement (GLUCERNA SHAKE)  237 mL Oral BID BM  . insulin aspart  0-5 Units Subcutaneous QHS  . insulin aspart  0-9 Units Subcutaneous TID WC  . levothyroxine  75 mcg Oral Q0600  . lip balm  1 application Topical BID  . miconazole   Topical BID  . phenytoin  50 mg Oral Daily  . phenytoin  100 mg Oral Daily  . phenytoin  200 mg Oral QHS  . polyvinyl alcohol  1 drop Both Eyes TID  . primidone  100 mg Oral QPC supper  . primidone  150 mg Oral q morning - 10a  . psyllium  1 packet Oral BID  . sodium chloride flush  3 mL Intravenous Q12H  . thiamine  250 mg Oral QHS  . verapamil  120 mg Oral QHS   Continuous Infusions: . sodium chloride    . lactated ringers    . methocarbamol (ROBAXIN) IV       LOS: 3 days   Georgette Shell, MD 03/09/2020, 2:34 PM

## 2020-03-09 NOTE — Plan of Care (Signed)
  Problem: Clinical Measurements: Goal: Postoperative complications will be avoided or minimized Outcome: Progressing   Problem: Skin Integrity: Goal: Demonstration of wound healing without infection will improve Outcome: Progressing   Problem: Education: Goal: Knowledge of General Education information will improve Description: Including pain rating scale, medication(s)/side effects and non-pharmacologic comfort measures Outcome: Progressing   Problem: Health Behavior/Discharge Planning: Goal: Ability to manage health-related needs will improve Outcome: Progressing   Problem: Clinical Measurements: Goal: Ability to maintain clinical measurements within normal limits will improve Outcome: Progressing Goal: Will remain free from infection Outcome: Progressing Goal: Diagnostic test results will improve Outcome: Progressing Goal: Respiratory complications will improve Outcome: Progressing Goal: Cardiovascular complication will be avoided Outcome: Progressing   Problem: Activity: Goal: Risk for activity intolerance will decrease Outcome: Progressing   Problem: Nutrition: Goal: Adequate nutrition will be maintained Outcome: Progressing   Problem: Coping: Goal: Level of anxiety will decrease Outcome: Progressing   Problem: Elimination: Goal: Will not experience complications related to bowel motility Outcome: Progressing Goal: Will not experience complications related to urinary retention Outcome: Progressing   Problem: Pain Managment: Goal: General experience of comfort will improve Outcome: Progressing   Problem: Safety: Goal: Ability to remain free from injury will improve Outcome: Progressing   Problem: Skin Integrity: Goal: Risk for impaired skin integrity will decrease Outcome: Progressing

## 2020-03-10 DIAGNOSIS — C182 Malignant neoplasm of ascending colon: Secondary | ICD-10-CM | POA: Diagnosis not present

## 2020-03-10 LAB — GLUCOSE, CAPILLARY
Glucose-Capillary: 165 mg/dL — ABNORMAL HIGH (ref 70–99)
Glucose-Capillary: 346 mg/dL — ABNORMAL HIGH (ref 70–99)
Glucose-Capillary: 145 mg/dL — ABNORMAL HIGH (ref 70–99)
Glucose-Capillary: 279 mg/dL — ABNORMAL HIGH (ref 70–99)

## 2020-03-10 MED ORDER — LIDOCAINE 5 % EX PTCH
1.0000 | MEDICATED_PATCH | Freq: Every day | CUTANEOUS | Status: DC | PRN
  Filled 2020-03-10: qty 2

## 2020-03-10 MED ORDER — DICLOFENAC SODIUM 1 % EX GEL
2.0000 g | Freq: Four times a day (QID) | CUTANEOUS | Status: DC | PRN
  Filled 2020-03-10: qty 100

## 2020-03-10 MED ORDER — SODIUM CHLORIDE 0.9 % IV SOLN: INTRAVENOUS | Status: DC

## 2020-03-10 MED ORDER — INSULIN GLARGINE 100 UNIT/ML ~~LOC~~ SOLN
14.0000 [IU] | Freq: Every day | SUBCUTANEOUS | Status: DC
  Administered 2020-03-10 – 2020-03-11 (×2): 14 [IU] via SUBCUTANEOUS
  Filled 2020-03-10 (×2): qty 0.14

## 2020-03-10 MED ORDER — DIPHENHYDRAMINE HCL 25 MG PO CAPS
25.0000 mg | ORAL_CAPSULE | Freq: Four times a day (QID) | ORAL | Status: DC | PRN
  Administered 2020-03-11 – 2020-03-12 (×2): 25 mg via ORAL
  Filled 2020-03-10 (×2): qty 1

## 2020-03-10 NOTE — Progress Notes (Signed)
PROGRESS NOTE    [name]  UMP:[id number] DOB: 1929-10-18 DOA: 03/06/2020 PCP: Bartholome Bill, MD    Brief Narrative: 86 year old female with recently diagnosed colon cancer adenocarcinoma status post laparoscopic right hemi-colectomy and lysis of adhesions 03/06/2020 by Dr. Dema Severin.  TRH consulted for medical management.  Assessment & Plan:   Principal Problem:   Cancer of ascending colon pT3pN0 (0/12 LN) s/p lap right colectomy 03/06/2020 Active Problems:   Insulin-requiring or dependent type II diabetes mellitus ([place])   GERD   IRRITABLE BOWEL SYNDROME  diarrhea type   Anxiety   Convulsions/seizures (HCC)   Gait abnormality   S/P right hemicolectomy   History of multiple allergies  #1 status post laparoscopic right hemicolectomy with lysis of adhesions 03/06/2020 with acute blood loss anemia-patient received 2 units of packed with appropriate improvement in hemoglobin.  Hemoglobin 9.0.  #2 hyponatremia-improving on normal saline.  Sodium 129 from 136.  Continue normal saline 75 cc an hour.  #3 type 2 diabetes on Lantus at home.  Continue SSI. Lantus restarted at 14 units nightly. CBG (last 3)  Recent Labs    03/09/20 2038 03/10/20 0722 03/10/20 1123  GLUCAP 214* 165* 346*    #4 history of essential hypertension -blood pressure is 172/78 Echo from 12/14/2019-normal ejection fraction  #5 history of hypothyroidism continue levothyroxine  #6 history of brain tumor status post surgery complicated with seizures followed by neurology as an outpatient continue home meds Dilantin and Mysoline.  #7 recent distal tibia fracture followed by Ortho.  #8 swelling of the  upper lip-patient has allergies to 62 medications listed.  She has no difficulty swallowing speaking or breathing.  Monitor closely.   Estimated body mass index is 21.63 kg/m as calculated from the following:   Height as of this encounter: 5' 7.5" (1.715 m).   Weight as of this encounter: 63.6 kg.  DVT  prophylaxis: Per primary team  code Status: Full code Family Communication: None at bedside  disposition Plan:  Status is: Inpatient   Subjective: Patient appears comfortable resting in bed no new complaints no events overnight. She denies any difficulty in swallowing no shortness of breath. Upper lip edema is decreasing. Objective: Vitals:   03/09/20 1345 03/09/20 2045 03/10/20 0601 03/10/20 1232  BP: (!) 162/75 (!) 166/85 (!) 153/76 (!) 172/78  Pulse: 72 83 75 84  Resp: 20 16 16 19   Temp: 98.3 F (36.8 C) 98.4 F (36.9 C) 97.7 F (36.5 C) 97.8 F (36.6 C)  TempSrc: Oral Oral Oral Oral  SpO2: 97% 94% 97% 99%  Weight: 63.6 kg     Height:        Intake/Output Summary (Last 24 hours) at 03/10/2020 1247 Last data filed at 03/10/2020 0957 Gross per 24 hour  Intake 120 ml  Output 3 ml  Net 117 ml   Filed Weights   03/07/20 0300 03/08/20 0440 03/09/20 1345  Weight: 58.5 kg 63.9 kg 63.6 kg    Examination: Patient on room air, upper lip edema decreasing  General exam: Appears comfortable Respiratory system: Clear to auscultation. Respiratory effort normal. Cardiovascular system: S1 & S2 heard, RRR. No JVD, murmurs, rubs, gallops or clicks. No pedal edema. Gastrointestinal system: Abdomen is distended, soft and tender. No organomegaly or masses felt. Normal bowel sounds heard.  Incision covered with intact dressing Central nervous system: Alert and oriented. No focal neurological deficits. Extremities: Symmetric 5 x 5 power. Skin: No rashes, lesions or ulcers Psychiatry: Judgement and insight appear normal. Mood &  affect appropriate.     Data Reviewed: I have personally reviewed following labs and imaging studies  CBC: Recent Labs  Lab 03/04/20 1138 03/07/20 0458 03/07/20 1829 03/08/20 0617 03/09/20 0558  WBC 9.0 10.1 9.0 7.8 7.9  NEUTROABS 6.2  --  7.1  --   --   HGB 8.2* 6.3* 9.2* 8.6* 9.0*  HCT 27.1* 20.5* 28.8* 26.9* 28.0*  MCV 74.7* 74.3* 78.3* 78.0* 77.3*   PLT 556* 383 338 290 782   Basic Metabolic Panel: Recent Labs  Lab 03/04/20 1138 03/07/20 0458 03/08/20 0617 03/09/20 0558  NA 130* 125* 126* 129*  K 4.9 4.3 3.9 3.5  CL 97* 97* 97* 100  CO2 22 21* 18* 21*  GLUCOSE 71 214* 170* 171*  BUN 10 6* 5* 5*  CREATININE 0.69 0.74 0.71 0.63  CALCIUM 9.0 7.6* 7.5* 7.7*   GFR: Estimated Creatinine Clearance: 49.1 mL/min (by C-G formula based on SCr of 0.63 mg/dL). Liver Function Tests: Recent Labs  Lab 03/04/20 1138  AST 30  ALT 35  ALKPHOS 140*  BILITOT 0.4  PROT 7.0  ALBUMIN 3.7   No results for input(s): LIPASE, AMYLASE in the last 168 hours. No results for input(s): AMMONIA in the last 168 hours. Coagulation Profile: Recent Labs  Lab 03/04/20 1138  INR 1.0   Cardiac Enzymes: No results for input(s): CKTOTAL, CKMB, CKMBINDEX, TROPONINI in the last 168 hours. BNP (last 3 results) No results for input(s): PROBNP in the last 8760 hours. HbA1C: No results for input(s): HGBA1C in the last 72 hours. CBG: Recent Labs  Lab 03/09/20 1130 03/09/20 1637 03/09/20 2038 03/10/20 0722 03/10/20 1123  GLUCAP 253* 211* 214* 165* 346*   Lipid Profile: No results for input(s): CHOL, HDL, LDLCALC, TRIG, CHOLHDL, LDLDIRECT in the last 72 hours. Thyroid Function Tests: No results for input(s): TSH, T4TOTAL, FREET4, T3FREE, THYROIDAB in the last 72 hours. Anemia Panel: No results for input(s): VITAMINB12, FOLATE, FERRITIN, TIBC, IRON, RETICCTPCT in the last 72 hours. Sepsis Labs: No results for input(s): PROCALCITON, LATICACIDVEN in the last 168 hours.  Recent Results (from the past 240 hour(s))  Respiratory Panel by RT PCR (Flu A&B, Covid) - Nasopharyngeal Swab     Status: None   Collection Time: 03/06/20  8:17 AM   Specimen: Nasopharyngeal Swab  Result Value Ref Range Status   SARS Coronavirus 2 by RT PCR NEGATIVE NEGATIVE Final    Comment: (NOTE) SARS-CoV-2 target nucleic acids are NOT DETECTED.  The SARS-CoV-2 RNA is  generally detectable in upper respiratoy specimens during the acute phase of infection. The lowest concentration of SARS-CoV-2 viral copies this assay can detect is 131 copies/mL. A negative result does not preclude SARS-Cov-2 infection and should not be used as the sole basis for treatment or other patient management decisions. A negative result [place] occur with  improper specimen collection/handling, submission of specimen other than nasopharyngeal swab, presence of viral mutation(s) within the areas targeted by this assay, and inadequate number of viral copies (<131 copies/mL). A negative result must be combined with clinical observations, patient history, and epidemiological information. The expected result is Negative.  Fact Sheet for Patients:  PinkCheek.be  Fact Sheet for Healthcare Providers:  GravelBags.it  This test is no t yet approved or cleared by the [place] FDA and  has been authorized for detection and/or diagnosis of SARS-CoV-2 by FDA under an Emergency Use Authorization (EUA). This EUA will remain  in effect (meaning this test can be used) for the duration of  the COVID-19 declaration under Section 564(b)(1) of the Act, 21 U.S.C. section 360bbb-3(b)(1), unless the authorization is terminated or revoked sooner.     Influenza A by PCR NEGATIVE NEGATIVE Final   Influenza B by PCR NEGATIVE NEGATIVE Final    Comment: (NOTE) The Xpert Xpress SARS-CoV-2/FLU/RSV assay is intended as an aid in  the diagnosis of influenza from Nasopharyngeal swab specimens and  should not be used as a sole basis for treatment. Nasal washings and  aspirates are unacceptable for Xpert Xpress SARS-CoV-2/FLU/RSV  testing.  Fact Sheet for Patients: PinkCheek.be  Fact Sheet for Healthcare Providers: GravelBags.it  This test is not yet approved or cleared by the [place] FDA and  has been authorized for detection and/or diagnosis of SARS-CoV-2 by  FDA under an Emergency Use Authorization (EUA). This EUA will remain  in effect (meaning this test can be used) for the duration of the  Covid-19 declaration under Section 564(b)(1) of the Act, 21  U.S.C. section 360bbb-3(b)(1), unless the authorization is  terminated or revoked. Performed at [facility], [place] [address] [place], [place] [address]          Radiology Studies: No results found.      Scheduled Meds: . acetaminophen  1,000 mg Oral Q6H  . vitamin C  500 mg Oral BID  . cholestyramine light  0.5 packet Oral QHS  . clotrimazole  1 Applicatorful Vaginal QHS  . enoxaparin (LOVENOX) injection  40 mg Subcutaneous Q24H  . escitalopram  10 mg Oral Daily  . feeding supplement (GLUCERNA SHAKE)  237 mL Oral BID BM  . insulin aspart  0-5 Units Subcutaneous QHS  . insulin aspart  0-9 Units Subcutaneous TID WC  . insulin glargine  14 Units Subcutaneous QHS  . levothyroxine  75 mcg Oral Q0600  . lip balm  1 application Topical BID  . miconazole   Topical BID  . phenytoin  50 mg Oral Daily  . phenytoin  100 mg Oral Daily  . phenytoin  200 mg Oral QHS  . polyvinyl alcohol  1 drop Both Eyes TID  . primidone  100 mg Oral QPC supper  . primidone  150 mg Oral q morning - 10a  . psyllium  1 packet Oral BID  . sodium chloride flush  3 mL Intravenous Q12H  . thiamine  250 mg Oral QHS  . verapamil  120 mg Oral QHS   Continuous Infusions: . sodium chloride    . lactated ringers    . methocarbamol (ROBAXIN) IV       LOS: 4 days   Georgette Shell, MD 03/10/2020, 12:47 PM

## 2020-03-10 NOTE — Plan of Care (Signed)
  Problem: Clinical Measurements: Goal: Postoperative complications will be avoided or minimized Outcome: Progressing   Problem: Skin Integrity: Goal: Demonstration of wound healing without infection will improve Outcome: Progressing   Problem: Education: Goal: Knowledge of General Education information will improve Description: Including pain rating scale, medication(s)/side effects and non-pharmacologic comfort measures Outcome: Progressing   Problem: Clinical Measurements: Goal: Ability to maintain clinical measurements within normal limits will improve Outcome: Progressing Goal: Will remain free from infection Outcome: Progressing Goal: Respiratory complications will improve Outcome: Progressing Goal: Cardiovascular complication will be avoided Outcome: Progressing   Problem: Activity: Goal: Risk for activity intolerance will decrease Outcome: Progressing   Problem: Nutrition: Goal: Adequate nutrition will be maintained Outcome: Progressing   Problem: Coping: Goal: Level of anxiety will decrease Outcome: Progressing   Problem: Elimination: Goal: Will not experience complications related to bowel motility Outcome: Progressing   Problem: Pain Managment: Goal: General experience of comfort will improve Outcome: Progressing   Problem: Safety: Goal: Ability to remain free from injury will improve Outcome: Progressing   Problem: Skin Integrity: Goal: Risk for impaired skin integrity will decrease Outcome: Progressing

## 2020-03-10 NOTE — Plan of Care (Signed)
  Problem: Clinical Measurements: Goal: Postoperative complications will be avoided or minimized Outcome: Progressing   Problem: Skin Integrity: Goal: Demonstration of wound healing without infection will improve Outcome: Progressing   Problem: Education: Goal: Knowledge of General Education information will improve Description: Including pain rating scale, medication(s)/side effects and non-pharmacologic comfort measures Outcome: Progressing   Problem: Health Behavior/Discharge Planning: Goal: Ability to manage health-related needs will improve Outcome: Progressing   Problem: Clinical Measurements: Goal: Ability to maintain clinical measurements within normal limits will improve Outcome: Progressing Goal: Will remain free from infection Outcome: Progressing Goal: Diagnostic test results will improve Outcome: Progressing Goal: Respiratory complications will improve Outcome: Progressing Goal: Cardiovascular complication will be avoided Outcome: Progressing   Problem: Activity: Goal: Risk for activity intolerance will decrease Outcome: Progressing   Problem: Nutrition: Goal: Adequate nutrition will be maintained Outcome: Progressing   Problem: Coping: Goal: Level of anxiety will decrease Outcome: Progressing   Problem: Elimination: Goal: Will not experience complications related to bowel motility Outcome: Progressing Goal: Will not experience complications related to urinary retention Outcome: Progressing   Problem: Pain Managment: Goal: General experience of comfort will improve Outcome: Progressing   Problem: Safety: Goal: Ability to remain free from injury will improve Outcome: Progressing   Problem: Skin Integrity: Goal: Risk for impaired skin integrity will decrease Outcome: Progressing

## 2020-03-10 NOTE — Progress Notes (Signed)
[name] [id number] [birth year]  CARE TEAM:  PCP: Bartholome Bill, MD  Outpatient Care Team: Patient Care Team: Bartholome Bill, MD as PCP - General (Family Medicine)  Inpatient Treatment Team: Treatment Team: Attending Provider: Ileana Roup, MD; Technician: Ernest Mallick, [place]; Rounding Team: Fatima Blank, MD; Registered Nurse: Ellouise Newer, RN; Utilization Review: Alease Medina, RN; Technician: Reynaldo Minium, [place]; Registered Nurse: Meryle Ready, RN   Problem List:   Principal Problem:   Cancer of ascending colon pT3pN0 (0/12 LN) s/p lap right colectomy 03/06/2020 Active Problems:   Insulin-requiring or dependent type II diabetes mellitus ([place])   GERD   IRRITABLE BOWEL SYNDROME  diarrhea type   Anxiety   Convulsions/seizures ([place])   Gait abnormality   S/P right hemicolectomy   History of multiple allergies   4 Days Post-Op  03/06/2020  PREOP DIAGNOSIS: Colon cancer  POSTOP DIAGNOSIS: Same  PROCEDURE:  1. Laparoscopic right hemicolectomy 2. Laparoscopic lysis of adhesions x 120 minutes 3. Bilateral transversus abdominus plane blocks  SURGEON: Sharon Mt. White, MD  FINDINGS:  No obvious metastatic disease on visceral parietal peritoneum or liver. Mass in proximal ascending colon. Significant adhesions of small bowel to small bowel as well as mesentery, tethered in pelvis from prior surgery. Right hemicolectomy carried out - small polyp in cecum, large mass in proximal ascending colon.  Assessment  OK  [facility] Stay = 4 days)  Plan:  -adv diet to solids.  We will do a dysphagia 3 given her swallowing issues -supplement nutrition -pathology pT3pN0 -most likely no post adjuvant therapy but can discuss with Dr. Dema Severin in office -try to wean off IVF w PRN backup -try to improve pain control -patient prefers IV meds and claims allergies to numerous medications -Diabetic control with insulin.  Still very hyperglycemic.  Restart  home Lantus.  Continue sliding scale -Hypertension control -Continue levothyroxine for hypothyroidism -VTE prophylaxis- SCDs, etc -mobilize as tolerated to help recovery  -Disposition planning.  Initially plan was to go back to skilled nursing facility but Dr. Dema Severin notes family leaning towards.  Hopefully can transition on Monday if gradually improves  30 minutes spent in review, evaluation, examination, counseling, and coordination of care.  More than 50% of that time was spent in counseling.  03/10/2020    Subjective: (Chief complaint)  Patient up in chair.  Pain better controlled.  Not much appetite but trying to take more in.  Elevated sugars  Objective:  Vital signs:  Vitals:   03/09/20 0501 03/09/20 1345 03/09/20 2045 03/10/20 0601  BP: 133/71 (!) 162/75 (!) 166/85 (!) 153/76  Pulse: 76 72 83 75  Resp: _0 Temp: 98.3 F (36.8 C) 98.3 F (36.8 C) 98.4 F (36.9 C) 97.7 F (36.5 C)  TempSrc: Oral Oral Oral Oral  SpO2: 95% 97% 94% 97%  Weight:  63.6 kg    Height:        Last BM Date: 03/08/20  Intake/Output   Yesterday:  11/06 0701 - 11/07 0700 In: 240 [P.O.:240] Out: -  This shift:  No intake/output data recorded.  Bowel function:  Flatus: YES  BM:  YES  Drain: (No drain)   Physical Exam:  General: Pt awake/alert in no acute distress.  Somewhat cachectic but not toxic Eyes: PERRL, normal EOM.  Sclera clear.  No icterus Neuro: CN II-XII intact w/o focal sensory/motor deficits. Lymph: No head/neck/groin lymphadenopathy Psych:  No delerium/psychosis/paranoia.  Oriented x 3 HENT: Normocephalic, Mucus membranes  moist.  No thrush Neck: Supple, No tracheal deviation.  No obvious thyromegaly Chest: No pain to chest wall compression.  Good respiratory excursion.  No audible wheezing CV:  Pulses intact.  Regular rhythm.  No major extremity edema MS: Normal AROM mjr joints.  No obvious deformity  Abdomen: Soft.  Nondistended.  Mildly tender  at incisions only.  No evidence of peritonitis.  No incarcerated hernias.  Ext:  No mjr edema.  No cyanosis Skin: No petechiae / purpurea.  No major sores.  Warm and dry    Results:   SURGICAL PATHOLOGY  CASE: [id number]  PATIENT: Alawna Kizziah  Surgical Pathology Report      Clinical History: Colon cancer (crm)      FINAL MICROSCOPIC DIAGNOSIS:   A. COLON, RIGHT, RESECTION:  - Invasive moderately differentiated adenocarcinoma, 4.7 cm, involving  proximal ascending colon  - Carcinoma invades into pericolonic soft tissue  - Negative for lymphovascular or perineural invasion  - Resection margins are negative for carcinoma  - Twelve lymph nodes, negative for carcinoma (0/12)  - A separate tubular adenoma without high-grade dysplasia  - See oncology table     ONCOLOGY TABLE:   COLON AND RECTUM: Resection, Including Transanal Disk Excision of  Rectal Neoplasms   Procedure: Right colon, resection  Tumor Site: Proximal ascending colon  Tumor Size: 4.7 cm  Macroscopic Tumor Perforation: Not identified  Histologic Type: Adenocarcinoma  Histologic Grade: G2: Moderately differentiated  Tumor Extension: Tumor invades pericolonic soft tissue  Margins: Uninvolved by tumor  Treatment Effect: No known presurgical therapy  Lymphovascular Invasion: Not identified  Perineural Invasion: Not identified  Tumor Deposits: Not identified  Regional Lymph Nodes:    Number of Lymph Nodes Involved: 0    Number of Lymph Nodes Examined: 12  Pathologic Stage Classification (pTNM, AJCC 8th Edition): pT3, pN0  Ancillary Studies: MMR / MSI testing will be ordered.  Representative Tumor Block: A6  Comments: None   (v4.1.0.0)      Theo Krumholz DESCRIPTION:   Specimen: Right colon, received fresh  Specimen integrity: Intact  Specimen length: The specimen consists of 8 cm of terminal ileum and 22  cm of colon.  Tumor location: Proximal ascending colon  Tumor size: The tumor consists of  a 4.7 x 2.2 cm sessile, focally  ulcerated, tan-red polypoid mass which measures 1.2 cm in maximum  thickness. The serosa is inked at the tumor.  Percent of bowel circumference involved: 75%  Tumor distance to margins:            Proximal: 11 cm            Distal:14 cm            Radial (posterior ascending, posterior descending;  lateral and posterior mid-rectum; and entire lower 1/3 rectum): 8.5 cm  Macroscopic extent of tumor invasion: The tumor involves full-thickness  of the wall but does not grossly appear to extend beyond the wall.  Total presumed lymph nodes: There are 12 rubbery ovoid nodules grossly  consistent with lymph nodes which vary in size from 0.3 to 1.2 cm in  greatest dimension.  Extramural satellite tumor nodules: Not grossly identified  Mucosal polyp(s): There is a 1.0 x 0.9 x 0.7 cm sessile tan-red mucosal  polyp in the cecum.  Additional findings: The remainder the mucosa is glistening and tan.  The appendix is not present.  Block summary:  11 blocks submitted  1 = proximal margin  2 = distal margin  3-6 = tumor  7 = cecum polyp  8 = 1 sectioned lymph node  9 = 1 sectioned lymph node  10 = 6 whole lymph nodes  11 = 4 whole lymph nodes (GRP 03/07/2020)     Final Diagnosis performed by Jaquita Folds, MD.  Electronically  signed 03/08/2020  Technical and / or Professional components performed at [facility], [place] [address] [place], [place] [address].  Immunohistochemistry Technical component (if applicable) was performed  at [facility] [address], [place],  [place], [place] [address].  IMMUNOHISTOCHEMISTRY DISCLAIMER (if applicable):  Some of these immunohistochemical stains Lenn have been developed and the  performance characteristics determine by [facility] Some  Suen not have been cleared or approved by the U.S. Food and Drug  Administration. The FDA has  determined that such clearance or approval  is not necessary. This test is used for clinical purposes. It should not  be regarded as investigational or for research. This laboratory is  certified under the [place]  (CLIA-88) as qualified to perform high complexity clinical laboratory  testing. The controls stained appropriately.   Cultures: Recent Results (from the past 720 hour(s))  Respiratory Panel by RT PCR (Flu A&B, Covid) - Nasopharyngeal Swab     Status: None   Collection Time: 03/06/20  8:17 AM   Specimen: Nasopharyngeal Swab  Result Value Ref Range Status   SARS Coronavirus 2 by RT PCR NEGATIVE NEGATIVE Final    Comment: (NOTE) SARS-CoV-2 target nucleic acids are NOT DETECTED.  The SARS-CoV-2 RNA is generally detectable in upper respiratoy specimens during the acute phase of infection. The lowest concentration of SARS-CoV-2 viral copies this assay can detect is 131 copies/mL. A negative result does not preclude SARS-Cov-2 infection and should not be used as the sole basis for treatment or other patient management decisions. A negative result [place] occur with  improper specimen collection/handling, submission of specimen other than nasopharyngeal swab, presence of viral mutation(s) within the areas targeted by this assay, and inadequate number of viral copies (<131 copies/mL). A negative result must be combined with clinical observations, patient history, and epidemiological information. The expected result is Negative.  Fact Sheet for Patients:  PinkCheek.be  Fact Sheet for Healthcare Providers:  GravelBags.it  This test is no t yet approved or cleared by the [place] FDA and  has been authorized for detection and/or diagnosis of SARS-CoV-2 by FDA under an Emergency Use Authorization (EUA). This EUA will remain  in effect (meaning this test can be used) for the  duration of the COVID-19 declaration under Section 564(b)(1) of the Act, 21 U.S.C. section 360bbb-3(b)(1), unless the authorization is terminated or revoked sooner.     Influenza A by PCR NEGATIVE NEGATIVE Final   Influenza B by PCR NEGATIVE NEGATIVE Final    Comment: (NOTE) The Xpert Xpress SARS-CoV-2/FLU/RSV assay is intended as an aid in  the diagnosis of influenza from Nasopharyngeal swab specimens and  should not be used as a sole basis for treatment. Nasal washings and  aspirates are unacceptable for Xpert Xpress SARS-CoV-2/FLU/RSV  testing.  Fact Sheet for Patients: PinkCheek.be  Fact Sheet for Healthcare Providers: GravelBags.it  This test is not yet approved or cleared by the [place] FDA and  has been authorized for detection and/or diagnosis of SARS-CoV-2 by  FDA under an Emergency Use Authorization (EUA). This EUA will remain  in effect (meaning this test can be used) for the duration of the  Covid-19 declaration under Section 564(b)(1) of the Act, 21  U.S.C. section 360bbb-3(b)(1), unless the authorization is  terminated or revoked. Performed at [facility], [place] [address], [place], [place] [address]     Labs: Results for orders placed or performed during the hospital encounter of 03/06/20 (from the past 48 hour(s))  Glucose, capillary     Status: Abnormal   Collection Time: 03/08/20 11:52 AM  Result Value Ref Range   Glucose-Capillary 179 (H) 70 - 99 mg/dL    Comment: Glucose reference range applies only to samples taken after fasting for at least 8 hours.   Comment 1 Notify RN    Comment 2 Document in Chart   Glucose, capillary     Status: Abnormal   Collection Time: 03/08/20  4:19 PM  Result Value Ref Range   Glucose-Capillary 102 (H) 70 - 99 mg/dL    Comment: Glucose reference range applies only to samples taken after fasting for at least 8 hours.   Comment 1 Notify RN     Comment 2 Document in Chart   Glucose, capillary     Status: Abnormal   Collection Time: 03/08/20  8:41 PM  Result Value Ref Range   Glucose-Capillary 212 (H) 70 - 99 mg/dL    Comment: Glucose reference range applies only to samples taken after fasting for at least 8 hours.  CBC     Status: Abnormal   Collection Time: 03/09/20  5:58 AM  Result Value Ref Range   WBC 7.9 4.0 - 10.5 K/uL   RBC 3.62 (L) 3.87 - 5.11 MIL/uL   Hemoglobin 9.0 (L) 12.0 - 15.0 g/dL   HCT 28.0 (L) 36 - 46 %   MCV 77.3 (L) 80.0 - 100.0 fL   MCH 24.9 (L) 26.0 - 34.0 pg   MCHC 32.1 30.0 - 36.0 g/dL   RDW 18.3 (H) 11.5 - 15.5 %   Platelets 338 150 - 400 K/uL   nRBC 0.0 0.0 - 0.2 %    Comment: Performed at [facility], [place] [address] [place], [place] [address]  Basic metabolic panel     Status: Abnormal   Collection Time: 03/09/20  5:58 AM  Result Value Ref Range   Sodium 129 (L) 135 - 145 mmol/L   Potassium 3.5 3.5 - 5.1 mmol/L   Chloride 100 98 - 111 mmol/L   CO2 21 (L) 22 - 32 mmol/L   Glucose, Bld 171 (H) 70 - 99 mg/dL    Comment: Glucose reference range applies only to samples taken after fasting for at least 8 hours.   BUN 5 (L) 8 - 23 mg/dL   Creatinine, Ser 0.63 0.44 - 1.00 mg/dL   Calcium 7.7 (L) 8.9 - 10.3 mg/dL   GFR, Estimated >60 >60 mL/min    Comment: (NOTE) Calculated using the CKD-EPI Creatinine Equation (2021)    Anion gap 8 5 - 15    Comment: Performed at [facility], [place] [address], [place], [place] [address]  Glucose, capillary     Status: Abnormal   Collection Time: 03/09/20  7:50 AM  Result Value Ref Range   Glucose-Capillary 165 (H) 70 - 99 mg/dL    Comment: Glucose reference range applies only to samples taken after fasting for at least 8 hours.  Glucose, capillary     Status: Abnormal   Collection Time: 03/09/20 11:30 AM  Result Value Ref Range   Glucose-Capillary 253 (H) 70 - 99 mg/dL  Comment: Glucose reference range applies only to  samples taken after fasting for at least 8 hours.  Glucose, capillary     Status: Abnormal   Collection Time: 03/09/20  4:37 PM  Result Value Ref Range   Glucose-Capillary 211 (H) 70 - 99 mg/dL    Comment: Glucose reference range applies only to samples taken after fasting for at least 8 hours.  Glucose, capillary     Status: Abnormal   Collection Time: 03/09/20  8:38 PM  Result Value Ref Range   Glucose-Capillary 214 (H) 70 - 99 mg/dL    Comment: Glucose reference range applies only to samples taken after fasting for at least 8 hours.    Imaging / Studies: No results found.  Medications / Allergies: per chart  Antibiotics: Anti-infectives (From admission, onward)   Start     Dose/Rate Route Frequency Ordered Stop   03/06/20 1400  neomycin (MYCIFRADIN) tablet 1,000 mg  Status:  Discontinued       "And" Linked Group Details   1,000 mg Oral 3 times per day 03/06/20 0816 03/06/20 0829   03/06/20 1400  metroNIDAZOLE (FLAGYL) tablet 1,000 mg  Status:  Discontinued       "And" Linked Group Details   1,000 mg Oral 3 times per day 03/06/20 0816 03/06/20 0829   03/06/20 0832  clindamycin (CLEOCIN) 900 MG/50ML IVPB       Note to Pharmacy: Charmayne Sheer   : cabinet override      03/06/20 0832 03/06/20 1047   03/06/20 0600  gentamicin (GARAMYCIN) 280 mg in dextrose 5 % 100 mL IVPB       "And" Linked Group Details   280 mg 107 mL/hr over 60 Minutes Intravenous On call to O.R. 03/05/20 0845 03/06/20 1014   03/05/20 0845  clindamycin (CLEOCIN) IVPB 900 mg       "And" Linked Group Details   900 mg 100 mL/hr over 30 Minutes Intravenous 60 min pre-op 03/05/20 0845 03/06/20 1053        Note: Portions of this report [place] have been transcribed using voice recognition software. Every effort was made to ensure accuracy; however, inadvertent computerized transcription errors [place] be present.   Any transcriptional errors that result from this process are unintentional.    Adin Hector, MD,  FACS, MASCRS Gastrointestinal and Minimally Invasive Surgery  [facility] [address], [place], [place] [address] [phone] Fax [phone] Main/Paging  CONTACT INFORMATION: Weekday (9AM-5PM) concerns: Call [facility] main office at [phone] Weeknight (5PM-9AM) or Weekend/Holiday concerns: Check [email or web address] for General Surgery [facility] coverage (Please, do not use SecureChat as it is not reliable communication to operating surgeons for immediate patient care)      03/10/2020  7:37 AM

## 2020-03-11 DIAGNOSIS — C182 Malignant neoplasm of ascending colon: Secondary | ICD-10-CM | POA: Diagnosis not present

## 2020-03-11 LAB — GLUCOSE, CAPILLARY
Glucose-Capillary: 95 mg/dL (ref 70–99)
Glucose-Capillary: 139 mg/dL — ABNORMAL HIGH (ref 70–99)
Glucose-Capillary: 224 mg/dL — ABNORMAL HIGH (ref 70–99)
Glucose-Capillary: 243 mg/dL — ABNORMAL HIGH (ref 70–99)

## 2020-03-11 LAB — BASIC METABOLIC PANEL
Anion gap: 11 (ref 5–15)
BUN: <5 mg/dL — ABNORMAL LOW (ref 8–23)
CO2: 20 mmol/L — ABNORMAL LOW (ref 22–32)
Calcium: 7.5 mg/dL — ABNORMAL LOW (ref 8.9–10.3)
Chloride: 99 mmol/L (ref 98–111)
Creatinine, Ser: 0.54 mg/dL (ref 0.44–1.00)
GFR, Estimated: >60 mL/min (ref >60)
Glucose, Bld: 108 mg/dL — ABNORMAL HIGH (ref 70–99)
Potassium: 2.9 mmol/L — ABNORMAL LOW (ref 3.5–5.1)
Sodium: 130 mmol/L — ABNORMAL LOW (ref 135–145)

## 2020-03-11 LAB — MAGNESIUM: Magnesium: 1.2 mg/dL — ABNORMAL LOW (ref 1.7–2.4)

## 2020-03-11 MED ORDER — POTASSIUM CHLORIDE CRYS ER 20 MEQ PO TBCR
40.0000 meq | EXTENDED_RELEASE_TABLET | Freq: Two times a day (BID) | ORAL | Status: DC
  Administered 2020-03-11: 40 meq via ORAL
  Filled 2020-03-11: qty 2

## 2020-03-11 MED ORDER — MAGNESIUM SULFATE 4 GM/100ML IV SOLN
4.0000 g | Freq: Once | INTRAVENOUS | Status: AC
  Administered 2020-03-11: 4 g via INTRAVENOUS
  Filled 2020-03-11: qty 100

## 2020-03-11 MED ORDER — POTASSIUM CHLORIDE 10 MEQ/100ML IV SOLN
10.0000 meq | INTRAVENOUS | Status: DC
  Filled 2020-03-11: qty 100

## 2020-03-11 MED ORDER — POTASSIUM CHLORIDE CRYS ER 20 MEQ PO TBCR
40.0000 meq | EXTENDED_RELEASE_TABLET | Freq: Three times a day (TID) | ORAL | Status: AC
  Administered 2020-03-11 (×2): 40 meq via ORAL
  Filled 2020-03-11: qty 4
  Filled 2020-03-11: qty 2

## 2020-03-11 NOTE — Care Management Important Message (Signed)
Important Message  Patient Details IM Letter given to the Patient Name: [name] MRN: [id number] Date of Birth: 1930-04-12   Medicare Important Message Given:  Yes     Kerin Salen 03/11/2020, 12:46 PM

## 2020-03-11 NOTE — Progress Notes (Signed)
Subjective No acute events. In tears this morning re:IV potassium that was ordered. Denies n/v. Having flatus and BM. Reports her niece had a fall and broke some ribs  Objective: Vital signs in last 24 hours: Temp:  [97.8 F (36.6 C)-98.2 F (36.8 C)] 98.2 F (36.8 C) (11/08 0520) Pulse Rate:  [82-84] 84 (11/08 0520) Resp:  [18-19] 18 (11/08 0520) BP: (157-172)/(78-82) 164/82 (11/08 0520) SpO2:  [98 %-99 %] 99 % (11/08 0520) Last BM Date: 03/10/20  Intake/Output from previous day: 11/07 0701 - 11/08 0700 In: 1429.2 [P.O.:600; I.V.:829.2] Out: 405 [Urine:403; Stool:2] Intake/Output this shift: No intake/output data recorded.  Gen: NAD, comfortable CV: RRR Pulm: Normal work of breathing Abd: Soft, minimal tenderness, nondistended; incisions c/d/i without drainage or erythema Ext: SCDs in place  Lab Results: CBC  Recent Labs    03/09/20 0558  WBC 7.9  HGB 9.0*  HCT 28.0*  PLT 338   BMET Recent Labs    03/09/20 0558 03/11/20 0458  NA 129* 130*  K 3.5 2.9*  CL 100 99  CO2 21* 20*  GLUCOSE 171* 108*  BUN 5* <5*  CREATININE 0.63 0.54  CALCIUM 7.7* 7.5*   PT/INR No results for input(s): LABPROT, INR in the last 72 hours. ABG No results for input(s): PHART, HCO3 in the last 72 hours.  Invalid input(s): PCO2, PO2  Studies/Results:  Anti-infectives: Anti-infectives (From admission, onward)   Start     Dose/Rate Route Frequency Ordered Stop   03/06/20 1400  neomycin (MYCIFRADIN) tablet 1,000 mg  Status:  Discontinued       "And" Linked Group Details   1,000 mg Oral 3 times per day 03/06/20 0816 03/06/20 0829   03/06/20 1400  metroNIDAZOLE (FLAGYL) tablet 1,000 mg  Status:  Discontinued       "And" Linked Group Details   1,000 mg Oral 3 times per day 03/06/20 0816 03/06/20 0829   03/06/20 0832  clindamycin (CLEOCIN) 900 MG/50ML IVPB       Note to Pharmacy: Charmayne Sheer   : cabinet override      03/06/20 0832 03/06/20 1047   03/06/20 0600  gentamicin  (GARAMYCIN) 280 mg in dextrose 5 % 100 mL IVPB       "And" Linked Group Details   280 mg 107 mL/hr over 60 Minutes Intravenous On call to O.R. 03/05/20 0845 03/06/20 1014   03/05/20 0845  clindamycin (CLEOCIN) IVPB 900 mg       "And" Linked Group Details   900 mg 100 mL/hr over 30 Minutes Intravenous 60 min pre-op 03/05/20 0845 03/06/20 1053       Assessment/Plan: Patient Active Problem List   Diagnosis Date Noted  . Cancer of ascending colon pT3pN0 (0/12 LN) s/p lap right colectomy 03/06/2020 03/09/2020  . History of multiple allergies 03/09/2020  . S/P right hemicolectomy 03/06/2020  . Closed fracture of shaft of tibia 02/12/2020  . Gait abnormality 12/04/2019  . Impaired left ventricular function 09/05/2019  . Hypokalemia   . Effusion of left knee joint   . Hyponatremia   . Left patella fracture 10/26/2018  . Fracture of patella 10/26/2018  . Acquired hypothyroidism 08/25/2017  . 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 Hammer toe 08/25/2017  . History of breast cancer 08/25/2017  . Nontoxic thyroid nodule 08/25/2017  . Osteopetrosis 08/25/2017  . Dysuria 01/19/2017  . Vitamin D deficiency 11/18/2016  . Tremor, essential 08/18/2016  . Convulsions/seizures ([place]) 10/16/2014  . Brain tumor (benign) ([place])  10/18/2013  . Subdural hemorrhage ([place]) 10/09/2013  . Anxiety 10/09/2013  . Compression fracture of T12 vertebra ([place]) 10/09/2013  . Nontoxic multinodular goiter 09/05/2013  . Syncope 08/27/2013  . Carotid artery disease ([place]) 08/27/2013  . Abnormal thyroid ultrasound 08/27/2013  . Cancer of central portion of female breast ([place]) 12/30/2012  . Osteoporosis 03/06/2010  . ASTHMA 03/05/2009  . Asthma 03/05/2009  . IRRITABLE BOWEL SYNDROME  diarrhea type 03/21/2008  . ANEMIA-NOS 12/16/2006  . GERD 12/16/2006  . Insulin-requiring or dependent type II diabetes mellitus ([place]) 10/29/2006  . Mixed hyperlipidemia 10/29/2006  . Essential hypertension 10/29/2006   . Allergic rhinitis, cause unspecified 10/29/2006  . OSTEOARTHRITIS 10/29/2006   s/p Procedure(s): LAPAROSCOPIC RIGHT HEMI COLECTOMY WITH TAP BLOCK AND LYSIS OF ADHESIONS 03/06/2020  -Diet as tolerated -Ok for replacement of potassium orally as IV seems to be quite an issue for her -Appreciate medicine's assistance in her care -Anticipate discharge back to SNF given changes at home as well -PPx: SQH, SCDs   LOS: 5 days   Sharon Mt. Dema Severin, M.D. [facility] Use [email or web address] to contact on call provider

## 2020-03-11 NOTE — TOC Progression Note (Signed)
Transition of Care [facility]) - Progression Note    Patient Details  Name: [name] MRN: [id number] Date of Birth: 09-05-1929  Transition of Care [facility]) CM/SW [place], LCSW Phone Number: 03/11/2020, 10:15 AM  Clinical Narrative:    CSW met with the patient at bedside to discuss SNF rehab placement. Patient declines rehab and prefers to return home. Patient reports her niece Hoyle Sauer assist 2 days Monday and Tuesday. Patient has help from additional caregivers Wednesday through Sunday. Patient reports her niece will assist with finding a replacement on Mon and Tues.   CSW reached out to the patient niece Hoyle Sauer. She explain she is trying to coordinate help for  Monday and Tuesday.    Expected Discharge Plan: [place] Barriers to Discharge: Continued Medical Work up  Expected Discharge Plan and Services Expected Discharge Plan: [place]   Discharge Planning Services: CM Consult   Living arrangements for the past 2 months: Single Family Home                           HH Arranged: PT [place]: [facility] Date [place]: 03/08/20 Time [place]: 1518 Representative spoke with at [place]: Amy   Social Determinants of Health ([place]) Interventions    Readmission Risk Interventions No flowsheet data found.

## 2020-03-11 NOTE — Progress Notes (Signed)
PT Cancellation Note  Patient Details Name: [name] MRN: [id number] DOB: March 28, 1930   Cancelled Treatment:    Reason Eval/Treat Not Completed: Other (comment).  Pt reports she is too groggy to do any therapy and asks to be reconsidered at another time.  Follow up as time and pt allow.   Ramond Dial 03/11/2020, 11:54 AM   Mee Hives, PT MS Acute Rehab Dept. Number: [place]

## 2020-03-11 NOTE — Progress Notes (Signed)
PROGRESS NOTE    [name]  ZCH:[id number] DOB: February 04, 1930 DOA: 03/06/2020 PCP: Bartholome Bill, MD    Brief Narrative: 86 year old female with recently diagnosed colon cancer adenocarcinoma status post laparoscopic right hemi-colectomy and lysis of adhesions 03/06/2020 by Dr. Dema Severin.  [facility] consulted for medical management.  Assessment & Plan:   Principal Problem:   Cancer of ascending colon pT3pN0 (0/12 LN) s/p lap right colectomy 03/06/2020 Active Problems:   Insulin-requiring or dependent type II diabetes mellitus ([place])   GERD   IRRITABLE BOWEL SYNDROME  diarrhea type   Anxiety   Convulsions/seizures ([facility])   Gait abnormality   S/P right hemicolectomy   History of multiple allergies  #1 status post laparoscopic right hemicolectomy with lysis of adhesions 03/06/2020 with acute blood loss anemia-patient received 2 units of packed with appropriate improvement in hemoglobin.  Hemoglobin 9.0.  #2 hyponatremia-likely due to nutritional deficiency and decreased p.o. intake improving on normal saline.  Decrease normal saline to 50 cc an hour.   #3 type 2 diabetes on Lantus at home.  Continue SSI. Lantus restarted at 14 units nightly. CBG (last 3)  Recent Labs    03/10/20 2110 03/11/20 0739 03/11/20 1149  GLUCAP 279* 95 139*    #4 history of essential hypertension -blood pressure 164/82.   Echo from 12/14/2019-normal ejection fraction  #5 history of hypothyroidism continue levothyroxine  #6 history of brain tumor status post surgery complicated with seizures followed by neurology as an outpatient continue home meds Dilantin and Mysoline.  #7 recent distal tibia fracture followed by Ortho.  #8 swelling of the  upper lip-patient has allergies to 62 medications listed.  She has no difficulty swallowing speaking or breathing.  Monitor closely.  #9 hypokalemia potassium 2.9 severe hypomagnesemia 1.2 replete both and recheck labs in a.m.   Estimated body mass index is 21.63 kg/m  as calculated from the following:   Height as of this encounter: 5' 7.5" (1.715 m).   Weight as of this encounter: 63.6 kg.  Subjective: She is adamant about going home.  Her niece was supposed to take care of her at home.  However her niece fell and broke a rib and not able to care for her.  However she is adamant about not going to a nursing home. Objective: Vitals:   03/10/20 0601 03/10/20 1232 03/10/20 2114 03/11/20 0520  BP: (!) 153/76 (!) 172/78 (!) 157/78 (!) 164/82  Pulse: 75 84 82 84  Resp: 16 19 18 18   Temp: 97.7 F (36.5 C) 97.8 F (36.6 C) 98 F (36.7 C) 98.2 F (36.8 C)  TempSrc: Oral Oral Oral Oral  SpO2: 97% 99% 98% 99%  Weight:      Height:        Intake/Output Summary (Last 24 hours) at 03/11/2020 1212 Last data filed at 03/11/2020 1025 Gross per 24 hour  Intake 1629.17 ml  Output 402 ml  Net 1227.17 ml   Filed Weights   03/07/20 0300 03/08/20 0440 03/09/20 1345  Weight: 58.5 kg 63.9 kg 63.6 kg    Examination: Patient on room air, upper lip edema decreasing  General exam: Appears comfortable Respiratory system: Clear to auscultation. Respiratory effort normal. Cardiovascular system: S1 & S2 heard, RRR. No JVD, murmurs, rubs, gallops or clicks. No pedal edema. Gastrointestinal system: Abdomen is distended, soft and tender. No organomegaly or masses felt. Normal bowel sounds heard.  Incision covered with intact dressing Central nervous system: Alert and oriented. No focal neurological deficits. Extremities: Symmetric 5  x 5 power. Skin: No rashes, lesions or ulcers Psychiatry: Judgement and insight appear normal. Mood & affect appropriate.     Data Reviewed: I have personally reviewed following labs and imaging studies  CBC: Recent Labs  Lab 03/07/20 0458 03/07/20 1829 03/08/20 0617 03/09/20 0558  WBC 10.1 9.0 7.8 7.9  NEUTROABS  --  7.1  --   --   HGB 6.3* 9.2* 8.6* 9.0*  HCT 20.5* 28.8* 26.9* 28.0*  MCV 74.3* 78.3* 78.0* 77.3*  PLT 383 338  290 202   Basic Metabolic Panel: Recent Labs  Lab 03/07/20 0458 03/08/20 0617 03/09/20 0558 03/11/20 0458  NA 125* 126* 129* 130*  K 4.3 3.9 3.5 2.9*  CL 97* 97* 100 99  CO2 21* 18* 21* 20*  GLUCOSE 214* 170* 171* 108*  BUN 6* 5* 5* <5*  CREATININE 0.74 0.71 0.63 0.54  CALCIUM 7.6* 7.5* 7.7* 7.5*  MG  --   --   --  1.2*   GFR: Estimated Creatinine Clearance: 49.1 mL/min (by C-G formula based on SCr of 0.54 mg/dL). Liver Function Tests: No results for input(s): AST, ALT, ALKPHOS, BILITOT, PROT, ALBUMIN in the last 168 hours. No results for input(s): LIPASE, AMYLASE in the last 168 hours. No results for input(s): AMMONIA in the last 168 hours. Coagulation Profile: No results for input(s): INR, PROTIME in the last 168 hours. Cardiac Enzymes: No results for input(s): CKTOTAL, CKMB, CKMBINDEX, TROPONINI in the last 168 hours. BNP (last 3 results) No results for input(s): PROBNP in the last 8760 hours. HbA1C: No results for input(s): HGBA1C in the last 72 hours. CBG: Recent Labs  Lab 03/10/20 1123 03/10/20 1639 03/10/20 2110 03/11/20 0739 03/11/20 1149  GLUCAP 346* 145* 279* 95 139*   Lipid Profile: No results for input(s): CHOL, HDL, LDLCALC, TRIG, CHOLHDL, LDLDIRECT in the last 72 hours. Thyroid Function Tests: No results for input(s): TSH, T4TOTAL, FREET4, T3FREE, THYROIDAB in the last 72 hours. Anemia Panel: No results for input(s): VITAMINB12, FOLATE, FERRITIN, TIBC, IRON, RETICCTPCT in the last 72 hours. Sepsis Labs: No results for input(s): PROCALCITON, LATICACIDVEN in the last 168 hours.  Recent Results (from the past 240 hour(s))  Respiratory Panel by RT PCR (Flu A&B, Covid) - Nasopharyngeal Swab     Status: None   Collection Time: 03/06/20  8:17 AM   Specimen: Nasopharyngeal Swab  Result Value Ref Range Status   SARS Coronavirus 2 by RT PCR NEGATIVE NEGATIVE Final    Comment: (NOTE) SARS-CoV-2 target nucleic acids are NOT DETECTED.  The SARS-CoV-2 RNA  is generally detectable in upper respiratoy specimens during the acute phase of infection. The lowest concentration of SARS-CoV-2 viral copies this assay can detect is 131 copies/mL. A negative result does not preclude SARS-Cov-2 infection and should not be used as the sole basis for treatment or other patient management decisions. A negative result [place] occur with  improper specimen collection/handling, submission of specimen other than nasopharyngeal swab, presence of viral mutation(s) within the areas targeted by this assay, and inadequate number of viral copies (<131 copies/mL). A negative result must be combined with clinical observations, patient history, and epidemiological information. The expected result is Negative.  Fact Sheet for Patients:  PinkCheek.be  Fact Sheet for Healthcare Providers:  GravelBags.it  This test is no t yet approved or cleared by the [place] FDA and  has been authorized for detection and/or diagnosis of SARS-CoV-2 by FDA under an Emergency Use Authorization (EUA). This EUA will remain  in effect (  meaning this test can be used) for the duration of the COVID-19 declaration under Section 564(b)(1) of the Act, 21 U.S.C. section 360bbb-3(b)(1), unless the authorization is terminated or revoked sooner.     Influenza A by PCR NEGATIVE NEGATIVE Final   Influenza B by PCR NEGATIVE NEGATIVE Final    Comment: (NOTE) The Xpert Xpress SARS-CoV-2/FLU/RSV assay is intended as an aid in  the diagnosis of influenza from Nasopharyngeal swab specimens and  should not be used as a sole basis for treatment. Nasal washings and  aspirates are unacceptable for Xpert Xpress SARS-CoV-2/FLU/RSV  testing.  Fact Sheet for Patients: PinkCheek.be  Fact Sheet for Healthcare Providers: GravelBags.it  This test is not yet approved or cleared by the [place] FDA and  has been authorized for detection and/or diagnosis of SARS-CoV-2 by  FDA under an Emergency Use Authorization (EUA). This EUA will remain  in effect (meaning this test can be used) for the duration of the  Covid-19 declaration under Section 564(b)(1) of the Act, 21  U.S.C. section 360bbb-3(b)(1), unless the authorization is  terminated or revoked. Performed at [facility], [place] [address] [place], [place] [address]          Radiology Studies: No results found.      Scheduled Meds: . acetaminophen  1,000 mg Oral Q6H  . vitamin C  500 mg Oral BID  . cholestyramine light  0.5 packet Oral QHS  . enoxaparin (LOVENOX) injection  40 mg Subcutaneous Q24H  . escitalopram  10 mg Oral Daily  . feeding supplement (GLUCERNA SHAKE)  237 mL Oral BID BM  . insulin aspart  0-5 Units Subcutaneous QHS  . insulin aspart  0-9 Units Subcutaneous TID WC  . insulin glargine  14 Units Subcutaneous QHS  . levothyroxine  75 mcg Oral Q0600  . lip balm  1 application Topical BID  . miconazole   Topical BID  . phenytoin  50 mg Oral Daily  . phenytoin  100 mg Oral Daily  . phenytoin  200 mg Oral QHS  . polyvinyl alcohol  1 drop Both Eyes TID  . potassium chloride  40 mEq Oral TID  . primidone  100 mg Oral QPC supper  . primidone  150 mg Oral q morning - 10a  . psyllium  1 packet Oral BID  . sodium chloride flush  3 mL Intravenous Q12H  . thiamine  250 mg Oral QHS  . verapamil  120 mg Oral QHS   Continuous Infusions: . sodium chloride    . sodium chloride 50 mL/hr at 03/11/20 0618  . magnesium sulfate bolus IVPB 4 g (03/11/20 1139)  . methocarbamol (ROBAXIN) IV       LOS: 5 days   Georgette Shell, MD 03/11/2020, 12:12 PM

## 2020-03-11 NOTE — Progress Notes (Signed)
I have attempted to contact her niece Hoyle Sauer again today to no avail  Nadeen Landau, [facility] Use [email or web address] to contact on call provider

## 2020-03-12 ENCOUNTER — Other Ambulatory Visit (HOSPITAL_COMMUNITY): Payer: Self-pay | Admitting: Surgery

## 2020-03-12 ENCOUNTER — Emergency Department (HOSPITAL_COMMUNITY): Payer: Medicare Other

## 2020-03-12 ENCOUNTER — Other Ambulatory Visit: Payer: Self-pay

## 2020-03-12 ENCOUNTER — Encounter (HOSPITAL_COMMUNITY): Payer: Self-pay

## 2020-03-12 ENCOUNTER — Inpatient Hospital Stay (HOSPITAL_COMMUNITY)
Admission: EM | Admit: 2020-03-12 | Discharge: 2020-03-21 | Disposition: A | Discharge status: Skilled Nursing Facility | Payer: Medicare Other | Source: Home / Self Care | Attending: Surgery | Admitting: Surgery

## 2020-03-12 DIAGNOSIS — R339 Retention of urine, unspecified: Secondary | ICD-10-CM | POA: Diagnosis not present

## 2020-03-12 DIAGNOSIS — Z800 Family history of malignant neoplasm of digestive organs: Secondary | ICD-10-CM

## 2020-03-12 DIAGNOSIS — E785 Hyperlipidemia, unspecified: Secondary | ICD-10-CM | POA: Diagnosis present

## 2020-03-12 DIAGNOSIS — E875 Hyperkalemia: Secondary | ICD-10-CM | POA: Diagnosis not present

## 2020-03-12 DIAGNOSIS — Z9841 Cataract extraction status, right eye: Secondary | ICD-10-CM

## 2020-03-12 DIAGNOSIS — Z90710 Acquired absence of both cervix and uterus: Secondary | ICD-10-CM

## 2020-03-12 DIAGNOSIS — Z8042 Family history of malignant neoplasm of prostate: Secondary | ICD-10-CM

## 2020-03-12 DIAGNOSIS — A419 Sepsis, unspecified organism: Secondary | ICD-10-CM

## 2020-03-12 DIAGNOSIS — Z8249 Family history of ischemic heart disease and other diseases of the circulatory system: Secondary | ICD-10-CM

## 2020-03-12 DIAGNOSIS — Z20822 Contact with and (suspected) exposure to covid-19: Secondary | ICD-10-CM | POA: Diagnosis present

## 2020-03-12 DIAGNOSIS — E871 Hypo-osmolality and hyponatremia: Secondary | ICD-10-CM | POA: Diagnosis present

## 2020-03-12 DIAGNOSIS — T8189XA Other complications of procedures, not elsewhere classified, initial encounter: Secondary | ICD-10-CM | POA: Diagnosis present

## 2020-03-12 DIAGNOSIS — Z8262 Family history of osteoporosis: Secondary | ICD-10-CM

## 2020-03-12 DIAGNOSIS — E876 Hypokalemia: Secondary | ICD-10-CM | POA: Diagnosis not present

## 2020-03-12 DIAGNOSIS — D62 Acute posthemorrhagic anemia: Secondary | ICD-10-CM | POA: Diagnosis present

## 2020-03-12 DIAGNOSIS — R112 Nausea with vomiting, unspecified: Secondary | ICD-10-CM | POA: Diagnosis present

## 2020-03-12 DIAGNOSIS — Z833 Family history of diabetes mellitus: Secondary | ICD-10-CM

## 2020-03-12 DIAGNOSIS — Z853 Personal history of malignant neoplasm of breast: Secondary | ICD-10-CM

## 2020-03-12 DIAGNOSIS — I10 Essential (primary) hypertension: Secondary | ICD-10-CM | POA: Diagnosis present

## 2020-03-12 DIAGNOSIS — Z8349 Family history of other endocrine, nutritional and metabolic diseases: Secondary | ICD-10-CM

## 2020-03-12 DIAGNOSIS — Z9114 Patient's other noncompliance with medication regimen: Secondary | ICD-10-CM

## 2020-03-12 DIAGNOSIS — Z794 Long term (current) use of insulin: Secondary | ICD-10-CM

## 2020-03-12 DIAGNOSIS — Z86011 Personal history of benign neoplasm of the brain: Secondary | ICD-10-CM

## 2020-03-12 DIAGNOSIS — K661 Hemoperitoneum: Secondary | ICD-10-CM | POA: Diagnosis present

## 2020-03-12 DIAGNOSIS — Z23 Encounter for immunization: Secondary | ICD-10-CM

## 2020-03-12 DIAGNOSIS — M810 Age-related osteoporosis without current pathological fracture: Secondary | ICD-10-CM | POA: Diagnosis present

## 2020-03-12 DIAGNOSIS — Y838 Other surgical procedures as the cause of abnormal reaction of the patient, or of later complication, without mention of misadventure at the time of the procedure: Secondary | ICD-10-CM | POA: Diagnosis present

## 2020-03-12 DIAGNOSIS — Z85820 Personal history of malignant melanoma of skin: Secondary | ICD-10-CM

## 2020-03-12 DIAGNOSIS — E039 Hypothyroidism, unspecified: Secondary | ICD-10-CM | POA: Diagnosis present

## 2020-03-12 DIAGNOSIS — Z85038 Personal history of other malignant neoplasm of large intestine: Secondary | ICD-10-CM

## 2020-03-12 DIAGNOSIS — Z8719 Personal history of other diseases of the digestive system: Secondary | ICD-10-CM

## 2020-03-12 DIAGNOSIS — Z923 Personal history of irradiation: Secondary | ICD-10-CM

## 2020-03-12 DIAGNOSIS — E1165 Type 2 diabetes mellitus with hyperglycemia: Secondary | ICD-10-CM | POA: Diagnosis present

## 2020-03-12 DIAGNOSIS — G40909 Epilepsy, unspecified, not intractable, without status epilepticus: Secondary | ICD-10-CM | POA: Diagnosis present

## 2020-03-12 DIAGNOSIS — C189 Malignant neoplasm of colon, unspecified: Secondary | ICD-10-CM | POA: Diagnosis present

## 2020-03-12 DIAGNOSIS — K567 Ileus, unspecified: Secondary | ICD-10-CM | POA: Diagnosis present

## 2020-03-12 DIAGNOSIS — Z9049 Acquired absence of other specified parts of digestive tract: Secondary | ICD-10-CM

## 2020-03-12 DIAGNOSIS — Z961 Presence of intraocular lens: Secondary | ICD-10-CM | POA: Diagnosis present

## 2020-03-12 DIAGNOSIS — B373 Candidiasis of vulva and vagina: Secondary | ICD-10-CM | POA: Diagnosis present

## 2020-03-12 DIAGNOSIS — E861 Hypovolemia: Secondary | ICD-10-CM | POA: Diagnosis not present

## 2020-03-12 HISTORY — DX: Nausea with vomiting, unspecified: R11.2

## 2020-03-12 LAB — BASIC METABOLIC PANEL
Anion gap: 10 (ref 5–15)
BUN: 6 mg/dL — ABNORMAL LOW (ref 8–23)
CO2: 19 mmol/L — ABNORMAL LOW (ref 22–32)
Calcium: 7.9 mg/dL — ABNORMAL LOW (ref 8.9–10.3)
Chloride: 101 mmol/L (ref 98–111)
Creatinine, Ser: 0.61 mg/dL (ref 0.44–1.00)
GFR, Estimated: >60 mL/min (ref >60)
Glucose, Bld: 81 mg/dL (ref 70–99)
Potassium: 4.7 mmol/L (ref 3.5–5.1)
Sodium: 130 mmol/L — ABNORMAL LOW (ref 135–145)

## 2020-03-12 LAB — CBC WITH DIFFERENTIAL/PLATELET
Abs Immature Granulocytes: 0.25 10*3/uL — ABNORMAL HIGH (ref 0.00–0.07)
Basophils Absolute: 0.1 10*3/uL (ref 0.0–0.1)
Basophils Relative: 0 %
Eosinophils Absolute: 0.3 10*3/uL (ref 0.0–0.5)
Eosinophils Relative: 1 %
HCT: 28.2 % — ABNORMAL LOW (ref 36.0–46.0)
Hemoglobin: 9 g/dL — ABNORMAL LOW (ref 12.0–15.0)
Immature Granulocytes: 1 %
Lymphocytes Relative: 8 %
Lymphs Abs: 1.8 10*3/uL (ref 0.7–4.0)
MCH: 25.4 pg — ABNORMAL LOW (ref 26.0–34.0)
MCHC: 31.9 g/dL (ref 30.0–36.0)
MCV: 79.7 fL — ABNORMAL LOW (ref 80.0–100.0)
Monocytes Absolute: 1.7 10*3/uL — ABNORMAL HIGH (ref 0.1–1.0)
Monocytes Relative: 7 %
Neutro Abs: 18.9 10*3/uL — ABNORMAL HIGH (ref 1.7–7.7)
Neutrophils Relative %: 83 %
Platelets: 484 10*3/uL — ABNORMAL HIGH (ref 150–400)
RBC: 3.54 MIL/uL — ABNORMAL LOW (ref 3.87–5.11)
RDW: 20.4 % — ABNORMAL HIGH (ref 11.5–15.5)
WBC: 23 10*3/uL — ABNORMAL HIGH (ref 4.0–10.5)
nRBC: 0 % (ref 0.0–0.2)

## 2020-03-12 LAB — RESPIRATORY PANEL BY RT PCR (FLU A&B, COVID)
Influenza A by PCR: NEGATIVE
Influenza B by PCR: NEGATIVE
SARS Coronavirus 2 by RT PCR: NEGATIVE

## 2020-03-12 LAB — COMPREHENSIVE METABOLIC PANEL
ALT: 22 U/L (ref 0–44)
AST: 21 U/L (ref 15–41)
Albumin: 2.8 g/dL — ABNORMAL LOW (ref 3.5–5.0)
Alkaline Phosphatase: 150 U/L — ABNORMAL HIGH (ref 38–126)
Anion gap: 10 (ref 5–15)
BUN: 8 mg/dL (ref 8–23)
CO2: 18 mmol/L — ABNORMAL LOW (ref 22–32)
Calcium: 7.6 mg/dL — ABNORMAL LOW (ref 8.9–10.3)
Chloride: 96 mmol/L — ABNORMAL LOW (ref 98–111)
Creatinine, Ser: 0.74 mg/dL (ref 0.44–1.00)
GFR, Estimated: >60 mL/min (ref >60)
Glucose, Bld: 253 mg/dL — ABNORMAL HIGH (ref 70–99)
Potassium: 5.5 mmol/L — ABNORMAL HIGH (ref 3.5–5.1)
Sodium: 124 mmol/L — ABNORMAL LOW (ref 135–145)
Total Bilirubin: 0.5 mg/dL (ref 0.3–1.2)
Total Protein: 5.7 g/dL — ABNORMAL LOW (ref 6.5–8.1)

## 2020-03-12 LAB — GLUCOSE, CAPILLARY
Glucose-Capillary: 84 mg/dL (ref 70–99)
Glucose-Capillary: 234 mg/dL — ABNORMAL HIGH (ref 70–99)

## 2020-03-12 LAB — LACTIC ACID, PLASMA
Lactic Acid, Venous: 2.2 mmol/L (ref 0.5–1.9)
Lactic Acid, Venous: 1.9 mmol/L (ref 0.5–1.9)

## 2020-03-12 LAB — SURGICAL PATHOLOGY

## 2020-03-12 LAB — PROTIME-INR
INR: 1 (ref 0.8–1.2)
Prothrombin Time: 12.6 seconds (ref 11.4–15.2)

## 2020-03-12 LAB — LIPASE, BLOOD: Lipase: 28 U/L (ref 11–51)

## 2020-03-12 LAB — MAGNESIUM: Magnesium: 2 mg/dL (ref 1.7–2.4)

## 2020-03-12 LAB — APTT: aPTT: 26 seconds (ref 24–36)

## 2020-03-12 MED ORDER — LACTATED RINGERS IV BOLUS (SEPSIS)
1000.0000 mL | Freq: Once | INTRAVENOUS | Status: AC
  Administered 2020-03-12: 1000 mL via INTRAVENOUS

## 2020-03-12 MED ORDER — METRONIDAZOLE IN NACL 5-0.79 MG/ML-% IV SOLN
500.0000 mg | Freq: Once | INTRAVENOUS | Status: AC
  Administered 2020-03-12: 500 mg via INTRAVENOUS
  Filled 2020-03-12: qty 100

## 2020-03-12 MED ORDER — LEVOTHYROXINE SODIUM 50 MCG PO TABS
75.0000 ug | ORAL_TABLET | Freq: Every day | ORAL | Status: DC
  Administered 2020-03-13 – 2020-03-21 (×8): 75 ug via ORAL
  Filled 2020-03-12 (×9): qty 1

## 2020-03-12 MED ORDER — ONDANSETRON HCL 4 MG/2ML IJ SOLN
4.0000 mg | Freq: Four times a day (QID) | INTRAMUSCULAR | Status: DC | PRN
  Administered 2020-03-12 – 2020-03-19 (×14): 4 mg via INTRAVENOUS
  Filled 2020-03-12 (×15): qty 2

## 2020-03-12 MED ORDER — DICLOFENAC SODIUM 1 % EX GEL
2.0000 g | Freq: Four times a day (QID) | CUTANEOUS | Status: DC | PRN
  Filled 2020-03-12: qty 100

## 2020-03-12 MED ORDER — SODIUM CHLORIDE 0.9 % IV SOLN
1.0000 g | Freq: Once | INTRAVENOUS | Status: AC
  Administered 2020-03-12: 1 g via INTRAVENOUS
  Filled 2020-03-12: qty 1

## 2020-03-12 MED ORDER — VANCOMYCIN HCL IN DEXTROSE 1-5 GM/200ML-% IV SOLN
1000.0000 mg | Freq: Once | INTRAVENOUS | Status: AC
  Administered 2020-03-12: 1000 mg via INTRAVENOUS
  Filled 2020-03-12: qty 200

## 2020-03-12 MED ORDER — PROMETHAZINE HCL 25 MG/ML IJ SOLN
6.2500 mg | Freq: Four times a day (QID) | INTRAMUSCULAR | Status: DC | PRN
  Administered 2020-03-13 – 2020-03-19 (×9): 6.25 mg via INTRAVENOUS
  Filled 2020-03-12 (×9): qty 1

## 2020-03-12 MED ORDER — OXYCODONE HCL 5 MG PO TABS
5.0000 mg | ORAL_TABLET | Freq: Four times a day (QID) | ORAL | 0 refills | Status: DC | PRN
Start: 2020-03-12 — End: 2020-03-19

## 2020-03-12 MED ORDER — INFLUENZA VAC A&B SA ADJ QUAD 0.5 ML IM PRSY
0.5000 mL | PREFILLED_SYRINGE | INTRAMUSCULAR | Status: AC
  Administered 2020-03-14: 0.5 mL via INTRAMUSCULAR
  Filled 2020-03-12: qty 0.5

## 2020-03-12 MED ORDER — OXYCODONE HCL 5 MG PO TABS
5.0000 mg | ORAL_TABLET | Freq: Four times a day (QID) | ORAL | Status: DC | PRN
  Administered 2020-03-13: 5 mg via ORAL
  Filled 2020-03-12: qty 1

## 2020-03-12 MED ORDER — LACTATED RINGERS IV SOLN: INTRAVENOUS | Status: DC

## 2020-03-12 MED ORDER — HEPARIN SODIUM (PORCINE) 5000 UNIT/ML IJ SOLN
5000.0000 [IU] | Freq: Three times a day (TID) | INTRAMUSCULAR | Status: DC
  Administered 2020-03-13: 5000 [IU] via SUBCUTANEOUS
  Filled 2020-03-12: qty 1

## 2020-03-12 MED ORDER — SIMETHICONE 80 MG PO CHEW
40.0000 mg | CHEWABLE_TABLET | Freq: Four times a day (QID) | ORAL | Status: DC | PRN
  Administered 2020-03-15: 40 mg via ORAL
  Filled 2020-03-12 (×2): qty 1

## 2020-03-12 MED ORDER — ONDANSETRON HCL 4 MG/2ML IJ SOLN
4.0000 mg | Freq: Once | INTRAMUSCULAR | Status: AC
  Administered 2020-03-12: 4 mg via INTRAVENOUS
  Filled 2020-03-12: qty 2

## 2020-03-12 MED ORDER — SODIUM CHLORIDE 0.9 % IV SOLN: INTRAVENOUS | Status: DC

## 2020-03-12 MED ORDER — DIPHENHYDRAMINE HCL 12.5 MG/5ML PO ELIX
12.5000 mg | ORAL_SOLUTION | Freq: Four times a day (QID) | ORAL | Status: DC | PRN
  Administered 2020-03-19: 12.5 mg via ORAL
  Filled 2020-03-12 (×2): qty 5

## 2020-03-12 MED ORDER — ONDANSETRON 4 MG PO TBDP: 4.0000 mg | ORAL_TABLET | Freq: Four times a day (QID) | ORAL | Status: DC | PRN

## 2020-03-12 MED ORDER — FENTANYL CITRATE (PF) 100 MCG/2ML IJ SOLN
12.5000 ug | INTRAMUSCULAR | Status: DC | PRN
  Administered 2020-03-12 – 2020-03-15 (×15): 12.5 ug via INTRAVENOUS
  Filled 2020-03-12 (×15): qty 2

## 2020-03-12 MED ORDER — FENTANYL CITRATE (PF) 100 MCG/2ML IJ SOLN
50.0000 ug | Freq: Once | INTRAMUSCULAR | Status: AC
  Administered 2020-03-12: 50 ug via INTRAVENOUS
  Filled 2020-03-12: qty 2

## 2020-03-12 MED ORDER — HYDROMORPHONE HCL 1 MG/ML IJ SOLN
0.5000 mg | Freq: Once | INTRAMUSCULAR | Status: AC
  Administered 2020-03-12: 0.5 mg via INTRAVENOUS
  Filled 2020-03-12: qty 1

## 2020-03-12 MED ORDER — INSULIN ASPART 100 UNIT/ML ~~LOC~~ SOLN
0.0000 [IU] | SUBCUTANEOUS | Status: DC
  Administered 2020-03-12: 5 [IU] via SUBCUTANEOUS
  Administered 2020-03-13 (×2): 3 [IU] via SUBCUTANEOUS
  Filled 2020-03-12: qty 0.15

## 2020-03-12 MED ORDER — DIPHENHYDRAMINE HCL 50 MG/ML IJ SOLN
12.5000 mg | Freq: Four times a day (QID) | INTRAMUSCULAR | Status: DC | PRN
  Administered 2020-03-12 – 2020-03-19 (×6): 12.5 mg via INTRAVENOUS
  Filled 2020-03-12 (×6): qty 1

## 2020-03-12 MED ORDER — ACETAMINOPHEN 325 MG PO TABS: 650.0000 mg | ORAL_TABLET | Freq: Four times a day (QID) | ORAL | Status: DC | PRN

## 2020-03-12 MED FILL — oxyCODONE HCL 5 MG TABS: 5 | 3 days supply | Qty: 10 | Fill #0

## 2020-03-12 NOTE — Plan of Care (Signed)
DC instructions given to patient and prescriptions were sent home with caregiver. All questions were answered and patient was taken home via [place].

## 2020-03-12 NOTE — ED Notes (Signed)
Date and time results received: 03/12/20 2113 (use smartphrase ".now" to insert current time)  Test: lactic acid Critical Value: 2.2  Name of Provider Notified: Alfredia Client, PA  Orders Received? Or Actions Taken?: condition known

## 2020-03-12 NOTE — ED Provider Notes (Signed)
[facility] DEPT Provider Note   CSN: [id number] Arrival date & time: 03/12/20  1721     History No chief complaint on file.    [name] is a 86 y.o. female with pertinent past medical history of hypertension, diabetes, breast cancer, seizures, recent laparoscopic right hemicolectomy on 03/06/20 by Dr. Dema Severin for colon cancer that presents emergency department today for abdominal pain and nausea.  Patient was discharged from [facility] this morning, patient states that she was doing fine this morning and wanted to go home.  Upon chart review patient was doing well per hospitalist and Dr. Dema Severin.  Was denying nausea vomiting and pain at that time.  Patient was discharged home with home health, there was a note that patient should be going to SNF but patient refused that at this time.  Patient lives alone at home, states that she went home and was feeling fine and then all of a sudden had sharp stabbing pain generalized in her abdomen with nausea and vomiting.  No hematemesis or bilious emesis.  Patient states that this abdominal pain is new, and she has been sick to her stomach.  States that she cannot stay at home alone, wants to come back into the hospital.  States that they tried to call Dr. Dema Severin without any success.  This morning patient did have bowel movement and normal flatus.  This morning patient was also tolerating diet.  Patient states that she was unable to eat after coming home, did take a tramadol without any relief.  Patient states this is the worst pain that she has had since the surgery.  HPI     Past Medical History:  Diagnosis Date  . Anemia   . Anxiety   . Asthma   . Breast cancer ([place]) 2014   right breast  . Cancer of right breast ([place]) 12/26/12   right breast 12:00 o'clock, DCIS  . Carotid artery disease ([place])   . Carpal tunnel syndrome, bilateral   . Chronic bronchitis ([place])   . Chronic cough   . Chronic facial pain   . Chronic foot  pain   . Colon cancer ([place]) dx'd 11/2019  . Complication of anesthesia    Sore jaw; could not chew or move mouth, prolonged sedation  . Convulsions/seizures ([place]) 10/16/2014  . Diabetes mellitus    type 2 niddm x 20 years  . Dyslipidemia   . Ejection fraction   . Gait abnormality 12/04/2019  . GERD (gastroesophageal reflux disease)   . Hammer toe    bilateral  . History of colonic polyps   . History of meningioma   . HTN (hypertension)   . Hx of radiation therapy 03/07/13- 03/29/13   right breast 4250 cGy 17 sessions  . Hyperlipidemia   . Hypokalemia   . Hyponatremia   . Hypothyroidism   . IBS (irritable bowel syndrome)   . Melanoma ([place])   . Metatarsal bone fracture 2014  . Multiple drug allergies   . Nontoxic thyroid nodule   . Obesity   . Osteoarthritis   . Osteoporosis   . Palpitations   . Personal history of radiation therapy 2014  . Seasonal allergies   . Skin cancer   . Syncope   . Tremor, essential 08/18/2016  . Vitamin B12 deficiency   . Vitamin D deficiency     Patient Active Problem List   Diagnosis Date Noted  . Nausea & vomiting 03/12/2020  . Cancer of ascending colon pT3pN0 (  0/12 LN) s/p lap right colectomy 03/06/2020 03/09/2020  . History of multiple allergies 03/09/2020  . S/P right hemicolectomy 03/06/2020  . Closed fracture of shaft of tibia 02/12/2020  . Gait abnormality 12/04/2019  . Impaired left ventricular function 09/05/2019  . Hypokalemia   . Effusion of left knee joint   . Hyponatremia   . Left patella fracture 10/26/2018  . Fracture of patella 10/26/2018  . Acquired hypothyroidism 08/25/2017  . 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 Hammer toe 08/25/2017  . History of breast cancer 08/25/2017  . Nontoxic thyroid nodule 08/25/2017  . Osteopetrosis 08/25/2017  . Dysuria 01/19/2017  . Vitamin D deficiency 11/18/2016  . Tremor, essential 08/18/2016  . Convulsions/seizures ([place]) 10/16/2014  . Brain tumor (benign) ([place])  10/18/2013  . Subdural hemorrhage ([place]) 10/09/2013  . Anxiety 10/09/2013  . Compression fracture of T12 vertebra ([place]) 10/09/2013  . Nontoxic multinodular goiter 09/05/2013  . Syncope 08/27/2013  . Carotid artery disease ([place]) 08/27/2013  . Abnormal thyroid ultrasound 08/27/2013  . Cancer of central portion of female breast ([place]) 12/30/2012  . Osteoporosis 03/06/2010  . ASTHMA 03/05/2009  . Asthma 03/05/2009  . IRRITABLE BOWEL SYNDROME  diarrhea type 03/21/2008  . ANEMIA-NOS 12/16/2006  . GERD 12/16/2006  . Insulin-requiring or dependent type II diabetes mellitus ([place]) 10/29/2006  . Mixed hyperlipidemia 10/29/2006  . Essential hypertension 10/29/2006  . Allergic rhinitis, cause unspecified 10/29/2006  . OSTEOARTHRITIS 10/29/2006    Past Surgical History:  Procedure Laterality Date  . ABDOMINAL HYSTERECTOMY    . BIOPSY  11/07/2019   Procedure: BIOPSY;  Surgeon: Ronald Lobo, MD;  Location: [facility];  Service: Endoscopy;;  . BRAIN SURGERY    . BREAST BIOPSY Right 01/24/2013   Procedure: RE-EXCICION OF BREAST CANCER, ANTERIOR MARGINS;  Surgeon: Edward Jolly, MD;  Location: [facility]S;  Service: General;  Laterality: Right;  . BREAST LUMPECTOMY Right 2014  . BREAST LUMPECTOMY WITH NEEDLE LOCALIZATION Right 01/17/2013   Procedure: BREAST LUMPECTOMY WITH NEEDLE LOCALIZATION;  Surgeon: Edward Jolly, MD;  Location: [facility];  Service: General;  Laterality: Right;  . BUNIONECTOMY Bilateral   . CATARACT EXTRACTION W/ INTRAOCULAR LENS IMPLANT Right   . CHOLECYSTECTOMY    . COLONOSCOPY    . COLONOSCOPY WITH PROPOFOL N/A 11/07/2019   Procedure: COLONOSCOPY WITH PROPOFOL;  Surgeon: Ronald Lobo, MD;  Location: [facility];  Service: Endoscopy;  Laterality: N/A;  . CRANIOTOMY Right 10/18/2013   Procedure: CRANIOTOMY TUMOR EXCISION;  Surgeon: Floyce Stakes, MD;  Location: [facility];  Service: Neurosurgery;  Laterality: Right;  . ESOPHAGOGASTRODUODENOSCOPY (EGD) WITH PROPOFOL  N/A 11/07/2019   Procedure: ESOPHAGOGASTRODUODENOSCOPY (EGD) WITH PROPOFOL;  Surgeon: Ronald Lobo, MD;  Location: [facility];  Service: Endoscopy;  Laterality: N/A;  . EYE SURGERY    . HERNIA REPAIR    . KNEE ARTHROSCOPY Bilateral   . LAPAROSCOPIC RIGHT HEMI COLECTOMY Right 03/06/2020   Procedure: LAPAROSCOPIC RIGHT HEMI COLECTOMY WITH TAP BLOCK AND LYSIS OF ADHESIONS;  Surgeon: Ileana Roup, MD;  Location: [facility];  Service: General;  Laterality: Right;  . POLYPECTOMY     small adenomatous  . ULNAR TUNNEL RELEASE       OB History   No obstetric history on file.     Family History  Problem Relation Age of Onset  . Heart disease Mother   . Osteoporosis Mother   . Diabetes Father   . Pancreatic cancer Father   . Colon cancer Other   .  Bone cancer Sister   . Prostate cancer Brother   . Colon cancer Brother   . Rectal cancer Sister   . Thyroid disease Sister        benign goiter resected    Social History   Tobacco Use  . Smoking status: Never Smoker  . Smokeless tobacco: Never Used  Vaping Use  . Vaping Use: Never used  Substance Use Topics  . Alcohol use: No  . Drug use: No    Home Medications Prior to Admission medications   Medication Sig Start Date End Date Taking? Authorizing Provider  ALPRAZolam (XANAX) 0.25 MG tablet TAKE 1 TABLET AT BEDTIME AS NEEDED (CAN TAKE 1 TABLET DURING THE DAY IF NEEDED FOR ANXIETY) Patient taking differently: Take 0.25 mg by mouth at bedtime as needed for anxiety.  11/14/19   Ward Givens, NP  Biotin 1000 MCG tablet Take 1,000 mcg by mouth daily.     [provider]  Blood Glucose Monitoring Suppl (PRODIGY VOICE BLOOD GLUCOSE) w/Device KIT Use to check blood sugar 1 time per day. 10/18/15   Renato Shin, MD  Cholecalciferol (VITAMIN D-3) 125 MCG (5000 UT) TABS Take 5,000 Units by mouth in the morning and at bedtime.    [provider]  cholestyramine light (PREVALITE) 4 g packet Take 0.5 packets by mouth  in the morning and at bedtime. 11/29/19   [provider]  Cranberry-Vitamin C-Probiotic (AZO CRANBERRY PO) Take 1 tablet by mouth daily.    [provider]  cyanocobalamin (,VITAMIN B-12,) 1000 MCG/ML injection INJECT 1 ML INTRAMUSCULARLY EVERY 21 DAYS Patient taking differently: Inject 1,000 mcg into the muscle every 21 ( twenty-one) days.  06/18/16   Hoyt Koch, MD  diclofenac Sodium (VOLTAREN) 1 % GEL Apply 2 g topically 4 (four) times daily as needed (pain.).  06/01/19   [provider]  diphenhydrAMINE (BENADRYL) 25 MG tablet Take 25-50 mg by mouth every 6 (six) hours as needed (runny nose/allergies.).     [provider]  docusate sodium (COLACE) 100 MG capsule Take 200 mg by mouth 2 (two) times daily as needed (constipation.).     [provider]  escitalopram (LEXAPRO) 10 MG tablet Take 10 mg by mouth daily.    [provider]  glucose blood test strip Use to check blood sugar 1 time per day. 10/22/15   Renato Shin, MD  insulin aspart (NOVOLOG) 100 UNIT/ML FlexPen Inject 2-3 Units into the skin 3 (three) times daily as needed (blood sugars greater than 150=2 units & 200=3 units). Sliding Scale Insulin 06/02/19   [provider]  insulin glargine (LANTUS) 100 UNIT/ML injection Inject 14 Units into the skin at bedtime.     [provider]  Insulin Pen Needle 32G X 4 MM MISC Used to inject insulin 3x daily 11/19/16   Renato Shin, MD  levothyroxine (SYNTHROID) 75 MCG tablet Take 75 mcg by mouth daily before breakfast.    [provider]  lidocaine (LIDODERM) 5 % Place 1 patch onto the skin daily. Remove & Discard patch within 12 hours or as directed by MD Patient taking differently: Place 1-2 patches onto the skin daily as needed (pain). Remove & Discard patch within 12 hours or as directed by MD 01/25/15   Hoyt Koch, MD  oxyCODONE (ROXICODONE) 5 MG immediate release tablet Take 1 tablet (5 mg  total) by mouth every 6 (six) hours as needed for up to 5 days (severe pain not controlled with tylenol  first - do not take with tramadol). 03/12/20 03/17/20  Ileana Roup, MD  phenytoin (DILANTIN) 100 MG ER capsule Take 1 capsule (100 mg) in the morning and Take 2 capsules (200 mg) at bedtime Patient taking differently: Take 100-200 mg by mouth See admin instructions. Take 1 capsule (100 mg) in the morning & Take 2 capsules (200 mg) at bedtime 05/16/19   Ward Givens, NP  phenytoin (DILANTIN) 50 MG tablet Chew 50 mg by mouth daily. Total morning dose=150 mg    [provider]  Polyethyl Glycol-Propyl Glycol (SYSTANE OP) Place 1 drop into both eyes in the morning, at noon, in the evening, and at bedtime.     [provider]  primidone (MYSOLINE) 50 MG tablet Three tablets in the morning and one at dinner time Patient taking differently: Take 100-150 mg by mouth See admin instructions. Take 3 tablets (150 mg) by mouth in the morning & take 2 tablets (100 mg) by mouth in the afternoon. 12/04/19   Kathrynn Ducking, MD  silver sulfADIAZINE (SILVADENE) 1 % cream Apply 1 application topically daily as needed (itching.).    [provider]  Thiamine HCl (VITAMIN B-1) 250 MG tablet Take 250 mg by mouth at bedtime.    [provider]  trandolapril-verapamil (TARKA) 2-240 MG tablet Take 1 tablet by mouth at bedtime.     [provider]    Allergies    Bystolic [nebivolol hcl], Cholestyramine, Hydrazine yellow [tartrazine], Morphine, Niacin, Niaspan [niacin er], Norvasc [amlodipine besylate], Optivar [azelastine hcl], Repaglinide, Sular [nisoldipine er], Tegretol [carbamazepine], Telmisartan, Amoxicillin-pot clavulanate, Cefdinir, Ciprofloxacin hcl, Clonidine, Clonidine hydrochloride, Codeine, Ezetimibe, Hydroxychloroquine, Naproxen, Sulfa antibiotics, Ziac [bisoprolol-hydrochlorothiazide], Azelastine, Elavil [amitriptyline], Empagliflozin, Hydralazine, Iodine,  Kenalog [triamcinolone], Keppra [levetiracetam], Lamictal [lamotrigine], Lyrica [pregabalin], Pseudoephedrine, Topamax [topiramate], Ace inhibitors, Actonel [risedronate sodium], Amlodipine besylate, Aspirin, Atacand [candesartan], Bextra [valdecoxib], Bisoprolol-hydrochlorothiazide, Candesartan cilexetil, Cefadroxil, Celecoxib, Hydrocodone, Hydrocodone-acetaminophen, Iodinated diagnostic agents, Meloxicam, Methylprednisolone sodium succinate, Nabumetone, Penicillins, Pseudoephedrine-guaifenesin, Risedronate sodium, Rofecoxib, Ru-tuss [chlorphen-pse-atrop-hyos-scop], Sulfonamide derivatives, Sulphur [sulfur], Telithromycin, Terfenadine, Trandolapril-verapamil hcl er, Trandolapril-verapamil hcl er, and Valium [diazepam]  Review of Systems   Review of Systems  Constitutional: Negative for chills, diaphoresis, fatigue and fever.  HENT: Negative for congestion, sore throat and trouble swallowing.   Eyes: Negative for pain and visual disturbance.  Respiratory: Negative for cough, shortness of breath and wheezing.   Cardiovascular: Negative for chest pain, palpitations and leg swelling.  Gastrointestinal: Positive for abdominal pain, nausea and vomiting. Negative for abdominal distention and diarrhea.  Genitourinary: Negative for difficulty urinating.  Musculoskeletal: Negative for back pain, neck pain and neck stiffness.  Skin: Negative for pallor.  Neurological: Negative for dizziness, speech difficulty, weakness and headaches.  Psychiatric/Behavioral: Negative for confusion.    Physical Exam Updated Vital Signs BP (!) 173/82   Pulse 89   Temp 97.7 F (36.5 C) (Oral)   Resp 15   Ht _0  (1.727 m)   SpO2 98%   BMI 21.32 kg/m   Physical Exam Constitutional:      General: She is in acute distress.     Appearance: Normal appearance. She is ill-appearing. She is not toxic-appearing or diaphoretic.  HENT:     Mouth/Throat:     Mouth: Mucous membranes are moist.     Pharynx: Oropharynx is  clear.  Eyes:     General: No scleral icterus.    Extraocular Movements: Extraocular movements intact.     Pupils: Pupils are equal, round, and reactive to light.  Cardiovascular:  Rate and Rhythm: Normal rate and regular rhythm.     Pulses: Normal pulses.     Heart sounds: Normal heart sounds.  Pulmonary:     Effort: Pulmonary effort is normal. No respiratory distress.     Breath sounds: Normal breath sounds. No stridor. No wheezing, rhonchi or rales.  Chest:     Chest wall: No tenderness.  Abdominal:     General: Abdomen is flat. There is distension.     Palpations: Abdomen is soft.     Tenderness: There is abdominal tenderness. There is guarding. There is no rebound.     Comments: Patient will scream when I touch her abdomen, tenderness generalized throughout with guarding.  Musculoskeletal:        General: No swelling or tenderness. Normal range of motion.     Cervical back: Normal range of motion and neck supple. No rigidity.     Right lower leg: No edema.     Left lower leg: No edema.  Skin:    General: Skin is warm and dry.     Capillary Refill: Capillary refill takes less than 2 seconds.     Coloration: Skin is not pale.  Neurological:     General: No focal deficit present.     Mental Status: She is alert and oriented to person, place, and time.  Psychiatric:        Mood and Affect: Mood normal.        Behavior: Behavior normal.     ED Results / Procedures / Treatments   Labs (all labs ordered are listed, but only abnormal results are displayed) Labs Reviewed  CBC WITH DIFFERENTIAL/PLATELET - Abnormal; Notable for the following components:      Result Value   WBC 23.0 (*)    RBC 3.54 (*)    Hemoglobin 9.0 (*)    HCT 28.2 (*)    MCV 79.7 (*)    MCH 25.4 (*)    RDW 20.4 (*)    Platelets 484 (*)    Neutro Abs 18.9 (*)    Monocytes Absolute 1.7 (*)    Abs Immature Granulocytes 0.25 (*)    All other components within normal limits  COMPREHENSIVE METABOLIC  PANEL - Abnormal; Notable for the following components:   Sodium 124 (*)    Potassium 5.5 (*)    Chloride 96 (*)    CO2 18 (*)    Glucose, Bld 253 (*)    Calcium 7.6 (*)    Total Protein 5.7 (*)    Albumin 2.8 (*)    Alkaline Phosphatase 150 (*)    All other components within normal limits  LACTIC ACID, PLASMA - Abnormal; Notable for the following components:   Lactic Acid, Venous 2.2 (*)    All other components within normal limits  URINE CULTURE  CULTURE, BLOOD (ROUTINE X 2)  CULTURE, BLOOD (ROUTINE X 2)  RESPIRATORY PANEL BY RT PCR (FLU A&B, COVID)  LIPASE, BLOOD  PROTIME-INR  APTT  URINALYSIS, ROUTINE W REFLEX MICROSCOPIC  LACTIC ACID, PLASMA    EKG None  Radiology CT ABDOMEN PELVIS WO CONTRAST  Result Date: 03/12/2020 CLINICAL DATA:  Acute generalized abdominal pain. EXAM: CT ABDOMEN AND PELVIS WITHOUT CONTRAST TECHNIQUE: Multidetector CT imaging of the abdomen and pelvis was performed following the standard protocol without IV contrast. COMPARISON:  November 22, 2019. FINDINGS: Lower chest: No acute abnormality. Hepatobiliary: No focal liver abnormality is seen. Status post cholecystectomy. No biliary dilatation. Pancreas: Grossly stable 15 mm cystic abnormality  is seen in pancreatic tail. No acute inflammation is noted. No ductal dilatation is noted. Spleen: Fluid is noted around the spleen. Adrenals/Urinary Tract: Adrenal glands are unremarkable. Kidneys are normal, without renal calculi, focal lesion, or hydronephrosis. Bladder is unremarkable. Stomach/Bowel: The stomach appears normal. Status post right hemicolectomy. Mildly dilated small bowel loops are noted proximally which most likely represent ileus. There does appear to be a moderate amount of high density material in the right lower quadrant and posterior pelvis consistent with hemorrhage. Vascular/Lymphatic: Aortic atherosclerosis. No enlarged abdominal or pelvic lymph nodes. Reproductive: Status post hysterectomy. No  adnexal masses. Other: No hernia is noted. Musculoskeletal: No acute or significant osseous findings. IMPRESSION: 1. Moderate amount of high density material is noted in the right lower quadrant and posterior pelvis consistent with hemorrhage. Critical Value/emergent results were called by telephone at the time of interpretation on 03/12/2020 at 8:12 pm to provider [facility] Naydelin Ziegler , who verbally acknowledged these results. 2. Mildly dilated small bowel loops are noted proximally which most likely represent ileus. 3. Grossly stable 15 mm cystic abnormality is seen in pancreatic tail. Follow-up with MRI in 1 year is recommended to ensure stability. 4. Aortic atherosclerosis. Aortic Atherosclerosis (ICD10-I70.0). Electronically Signed   By: Marijo Conception M.D.   On: 03/12/2020 20:12   DG Chest Port 1 View  Result Date: 03/12/2020 CLINICAL DATA:  Questionable sepsis, evaluate for abnormality, surgery 03/06/2020 for colon cancer EXAM: PORTABLE CHEST 1 VIEW COMPARISON:  CT 02/17/2020, radiograph 02/17/2020 FINDINGS: No consolidation, features of edema, pneumothorax, or effusion. Pulmonary vascularity is normally distributed. The aorta is calcified. The remaining cardiomediastinal contours are unremarkable. No acute osseous or soft tissue abnormality. Telemetry leads overlie the chest. IMPRESSION: No acute cardiopulmonary abnormality. Specifically, no focal consolidative opacity to suggest pneumonia. Electronically Signed   By: Lovena Le M.D.   On: 03/12/2020 19:59    Procedures .Critical Care Performed by: Alfredia Client, PA-C Authorized by: Alfredia Client, PA-C   Critical care provider statement:    Critical care time (minutes):  45   Critical care was necessary to treat or prevent imminent or life-threatening deterioration of the following conditions:  Sepsis   Critical care was time spent personally by me on the following activities:  Discussions with consultants, evaluation of patient's response to  treatment, examination of patient, ordering and performing treatments and interventions, ordering and review of laboratory studies, ordering and review of radiographic studies, pulse oximetry, re-evaluation of patient's condition, obtaining history from patient or surrogate and review of old charts   (including critical care time)  Medications Ordered in ED Medications  vancomycin (VANCOCIN) IVPB 1000 mg/200 mL premix (1,000 mg Intravenous New Bag/Given (Non-Interop) 03/12/20 2032)  0.9 %  sodium chloride infusion (has no administration in time range)  fentaNYL (SUBLIMAZE) injection 50 mcg (50 mcg Intravenous Given 03/12/20 1843)  lactated ringers bolus 1,000 mL (0 mLs Intravenous Stopped 03/12/20 2100)    And  lactated ringers bolus 1,000 mL (1,000 mLs Intravenous New Bag/Given 03/12/20 2023)  metroNIDAZOLE (FLAGYL) IVPB 500 mg (500 mg Intravenous New Bag/Given (Non-Interop) 03/12/20 2014)  ondansetron (ZOFRAN) injection 4 mg (4 mg Intravenous Given 03/12/20 2031)  HYDROmorphone (DILAUDID) injection 0.5 mg (0.5 mg Intravenous Given 03/12/20 2059)    ED Course  I have reviewed the triage vital signs and the nursing notes.  Pertinent labs & imaging results that were available during my care of the patient were reviewed by me and considered in my medical decision making (see chart for  details).    MDM Rules/Calculators/A&P                           Courtni Balash Harshfield is a 86 y.o. female with pertinent past medical history of hypertension, diabetes, breast cancer, seizures, recent laparoscopic right hemicolectomy on 03/06/20 by Dr. Dema Severin for colon cancer that presents emergency department today for abdominal pain and nausea.  Patient appears acutely ill, physical exam with peritoneal signs of abdomen.  Sepsis protocol initiated due to white count of 23 and tachycardia, intra-abdominal antibiotics started after blood cultures drawn.  CT abdomen ordered, patient is allergic to contrast will obtain without at  this time.  Differential to include perforation, complicated diverticulitis, obstruction, other acute intra-abdominal pathology.  Work-up today was electrolyte derangements including sodium of 124, potassium of 5.5.  CBC white count of 23, hemoglobin of 9, this does appear to be what it was for the past couple of days.  Lactic acid elevated to 2.2.  Urinalysis pending.   CT abdomen pelvis with bleeding noted in pelvis with ileus, will consult surgery at this time.  910 spoke to Dr. Redmond Pulling, general surgeon, who will admit the patient to surgery service.  I discussed this case with my attending physician who cosigned this note including patient's presenting symptoms, physical exam, and planned diagnostics and interventions. Attending physician stated agreement with plan or made changes to plan which were implemented.   Attending physician assessed patient at bedside.  Final Clinical Impression(s) / ED Diagnoses Final diagnoses:  Sepsis without acute organ dysfunction, due to unspecified organism [facility])    Rx / Mutual Orders ED Discharge Orders    None       Alfredia Client, PA-C 03/12/20 2118    Varney Biles, MD 03/12/20 2328

## 2020-03-12 NOTE — H&P (Signed)
CC: nausea, feel weak  Requesting provider: n/a  HPI: [name] is an 86 y.o. female who brought to the ER from home after being discharged from [facility] long earlier today with complaints of feeling weak, nausea, and abdominal pain.  She really cannot give me a lot of history.  She states that her pain worsened this afternoon.  She states that she just felt bad.  She also had nausea.  She denies any vomiting.  She denies any fever or chills.  She states that she had a bowel movement today.  Denies dysuria or frequency  Found to have a SIRS type picture in the emergency room with an elevated white blood cell count.  CT imaging was performed which demonstrated mild small bowel dilatation and fluid within the abdomen consistent with blood.  Her hemoglobin is stable from discharge but her leukocytosis is new.  She was given fluids and started on empiric antibiotics.  Urinalysis is not done yet.  Chest x-ray was unremarkable.  Initial lactate was elevated but repeat lactate was normal  She was recently in the hospital and underwent laparoscopic right hemicolectomy with extensive adhesiolysis by Dr. Dema Severin on November 3 for colon cancer.  Postop course complicated by bleeding which required blood transfusion.  She also had some hyponatremia which improved.  Additional comorbidities include hypertension, diabetes mellitus type 2, remote history of seizures, hypothyroidism, multiple allergies  Past Medical History:  Diagnosis Date  . Anemia   . Anxiety   . Asthma   . Breast cancer ([place]) 2014   right breast  . Cancer of right breast ([place]) 12/26/12   right breast 12:00 o'clock, DCIS  . Carotid artery disease ([place])   . Carpal tunnel syndrome, bilateral   . Chronic bronchitis ([place])   . Chronic cough   . Chronic facial pain   . Chronic foot pain   . Colon cancer ([place]) dx'd 11/2019  . Complication of anesthesia    Sore jaw; could not chew or move mouth, prolonged sedation  . Convulsions/seizures ([place])  10/16/2014  . Diabetes mellitus    type 2 niddm x 20 years  . Dyslipidemia   . Ejection fraction   . Gait abnormality 12/04/2019  . GERD (gastroesophageal reflux disease)   . Hammer toe    bilateral  . History of colonic polyps   . History of meningioma   . HTN (hypertension)   . Hx of radiation therapy 03/07/13- 03/29/13   right breast 4250 cGy 17 sessions  . Hyperlipidemia   . Hypokalemia   . Hyponatremia   . Hypothyroidism   . IBS (irritable bowel syndrome)   . Melanoma ([place])   . Metatarsal bone fracture 2014  . Multiple drug allergies   . Nontoxic thyroid nodule   . Obesity   . Osteoarthritis   . Osteoporosis   . Palpitations   . Personal history of radiation therapy 2014  . Seasonal allergies   . Skin cancer   . Syncope   . Tremor, essential 08/18/2016  . Vitamin B12 deficiency   . Vitamin D deficiency     Past Surgical History:  Procedure Laterality Date  . ABDOMINAL HYSTERECTOMY    . BIOPSY  11/07/2019   Procedure: BIOPSY;  Surgeon: Ronald Lobo, MD;  Location: [facility];  Service: Endoscopy;;  . BRAIN SURGERY    . BREAST BIOPSY Right 01/24/2013   Procedure: RE-EXCICION OF BREAST CANCER, ANTERIOR MARGINS;  Surgeon: Edward Jolly, MD;  Location: [facility];  Service: General;  Laterality: Right;  . BREAST LUMPECTOMY Right 2014  . BREAST LUMPECTOMY WITH NEEDLE LOCALIZATION Right 01/17/2013   Procedure: BREAST LUMPECTOMY WITH NEEDLE LOCALIZATION;  Surgeon: Edward Jolly, MD;  Location: [facility];  Service: General;  Laterality: Right;  . BUNIONECTOMY Bilateral   . CATARACT EXTRACTION W/ INTRAOCULAR LENS IMPLANT Right   . CHOLECYSTECTOMY    . COLONOSCOPY    . COLONOSCOPY WITH PROPOFOL N/A 11/07/2019   Procedure: COLONOSCOPY WITH PROPOFOL;  Surgeon: Ronald Lobo, MD;  [facility];  Service: Endoscopy;  Laterality: N/A;  . CRANIOTOMY Right 10/18/2013   Procedure: CRANIOTOMY TUMOR EXCISION;  Surgeon: Floyce Stakes, MD;  [facility];   Service: Neurosurgery;  Laterality: Right;  . ESOPHAGOGASTRODUODENOSCOPY (EGD) WITH PROPOFOL N/A 11/07/2019   Procedure: ESOPHAGOGASTRODUODENOSCOPY (EGD) WITH PROPOFOL;  Surgeon: Ronald Lobo, MD;  Location[facility];  Service: Endoscopy;  Laterality: N/A;  . EYE SURGERY    . HERNIA REPAIR    . KNEE ARTHROSCOPY Bilateral   . LAPAROSCOPIC RIGHT HEMI COLECTOMY Right 03/06/2020   Procedure: LAPAROSCOPIC RIGHT HEMI COLECTOMY WITH TAP BLOCK AND LYSIS OF ADHESIONS;  Surgeon: Ileana Roup, MD;  Location[facility];  Service: General;  Laterality: Right;  . POLYPECTOMY     small adenomatous  . ULNAR TUNNEL RELEASE      Family History  Problem Relation Age of Onset  . Heart disease Mother   . Osteoporosis Mother   . Diabetes Father   . Pancreatic cancer Father   . Colon cancer Other   . Bone cancer Sister   . Prostate cancer Brother   . Colon cancer Brother   . Rectal cancer Sister   . Thyroid disease Sister        benign goiter resected    Social:  reports that she has never smoked. She has never used smokeless tobacco. She reports that she does not drink alcohol and does not use drugs.  Allergies:  Allergies  Allergen Reactions  . Bystolic [Nebivolol Hcl] Other (See Comments)    "extreme weakness, heaviness in legs & arms, swelling in legs/arms/face, swollen abdomen, pain in bladder, feet pain, soreness in chest"  . Cholestyramine     "itching rash on stomach, bloated, nausea, vomiting, sleeplessness, extreme pain in arms"  . Hydrazine Yellow [Tartrazine] Other (See Comments)    "does not reduce high blood pressure, pain in arm, high pressure, felt like I was on verge of heart attack, really weak"  . Morphine Other (See Comments)    Feels morbid, weak, still in pain  . Niacin Palpitations    Fast heart beat  . Niaspan [Niacin Er] Palpitations and Other (See Comments)    "fast heart beat, high blood pressure"  . Norvasc [Amlodipine Besylate] Other (See Comments)     "extreme fluid retention/pain)  . Optivar [Azelastine Hcl] Photosensitivity  . Repaglinide Hives  . Sular [Nisoldipine Er] Other (See Comments)    "severe headaches, swelling eyes, hands, feet, shortness of breath, weak, flushed face, brain boiling, fluid retention, high blood sugar, nervous, heart fast beating"  . Tegretol [Carbamazepine] Other (See Comments)    Blood poisoning   . Telmisartan Other (See Comments)    "headache, difficulty urinating, high blood sugar, fluid retention"  . Amoxicillin-Pot Clavulanate Rash  . Cefdinir Swelling    Vaginal irritation, breathing,   . Ciprofloxacin Hcl Hives  . Clonidine Other (See Comments)    Dry mouth, fluid retention  . Clonidine Hydrochloride Other (See Comments)  Dry mouth, fluid retention  . Codeine Nausea And Vomiting  . Ezetimibe Other (See Comments)    Made weak  . Hydroxychloroquine Other (See Comments)    Low platlets  . Naproxen Other (See Comments)    Shrinks bladder  . Sulfa Antibiotics Rash  . Ziac [Bisoprolol-Hydrochlorothiazide] Other (See Comments)    "stopped urination"  . Azelastine Other (See Comments)  . Elavil [Amitriptyline] Other (See Comments)    Gave Pt nightmares  . Empagliflozin Other (See Comments)    "Caused yeast infection, slowed my urine"  . Hydralazine   . Iodine   . Kenalog [Triamcinolone] Diarrhea  . Keppra [Levetiracetam] Other (See Comments)    Shaking  . Lamictal [Lamotrigine]     itching  . Lyrica [Pregabalin] Swelling  . Pseudoephedrine   . Topamax [Topiramate]     Dry eyes  . Ace Inhibitors Other (See Comments)    unknown  . Actonel [Risedronate Sodium] Other (See Comments)    unknown  . Amlodipine Besylate Other (See Comments)    unknown  . Aspirin Other (See Comments)    unknown  . Atacand [Candesartan] Other (See Comments)    unknown  . Bextra [Valdecoxib] Other (See Comments)    unknown  . Bisoprolol-Hydrochlorothiazide Other (See Comments)    unknown  . Candesartan  Cilexetil Other (See Comments)    unknown  . Cefadroxil Other (See Comments)    unknown  . Celecoxib Rash  . Hydrocodone Other (See Comments)    unknown  . Hydrocodone-Acetaminophen Other (See Comments)    unknown  . Iodinated Diagnostic Agents Rash    "All over" "All over"  . Meloxicam Other (See Comments)    unknown  . Methylprednisolone Sodium Succinate Other (See Comments)    unknown  . Nabumetone Other (See Comments)    unknown  . Penicillins Other (See Comments)    unknown  . Pseudoephedrine-Guaifenesin Other (See Comments)    unknown  . Risedronate Sodium Other (See Comments)    unknown  . Rofecoxib Other (See Comments)    unknown  . Ru-Tuss [Chlorphen-Pse-Atrop-Hyos-Scop] Other (See Comments)    unknown  . Sulfonamide Derivatives Other (See Comments)    unknown  . Sulphur [Sulfur] Other (See Comments)    unknown  . Telithromycin Other (See Comments)    unknown  . Terfenadine Other (See Comments)    unknown  . Trandolapril-Verapamil Hcl Er Other (See Comments)    Headache, difficulty urinating, high blood sugar, fluid retention  Pt is taking Tarka (trandolapril-verapamil) currently, but requests the medication stay in her allergy list  . Trandolapril-Verapamil Hcl Er Other (See Comments)    Headache, difficulty urinating, high blood sugar, fluid retention  Pt is taking Tarka (trandolapril-verapamil) currently, but requests the medication stay in her allergy list  . Valium [Diazepam] Other (See Comments)    Makes her mean and hyper    Medications: I have reviewed the patient's current medications.  Results for orders placed or performed during the hospital encounter of 03/12/20 (from the past 48 hour(s))  CBC with Differential     Status: Abnormal   Collection Time: 03/12/20  6:46 PM  Result Value Ref Range   WBC 23.0 (H) 4.0 - 10.5 K/uL   RBC 3.54 (L) 3.87 - 5.11 MIL/uL   Hemoglobin 9.0 (L) 12.0 - 15.0 g/dL   HCT 28.2 (L) 36 - 46 %   MCV 79.7 (L) 80.0  - 100.0 fL   MCH 25.4 (L) 26.0 - 34.0 pg  MCHC 31.9 30.0 - 36.0 g/dL   RDW 20.4 (H) 11.5 - 15.5 %   Platelets 484 (H) 150 - 400 K/uL   nRBC 0.0 0.0 - 0.2 %   Neutrophils Relative % 83 %   Neutro Abs 18.9 (H) 1.7 - 7.7 K/uL   Lymphocytes Relative 8 %   Lymphs Abs 1.8 0.7 - 4.0 K/uL   Monocytes Relative 7 %   Monocytes Absolute 1.7 (H) 0.1 - 1.0 K/uL   Eosinophils Relative 1 %   Eosinophils Absolute 0.3 0.0 - 0.5 K/uL   Basophils Relative 0 %   Basophils Absolute 0.1 0.0 - 0.1 K/uL   Immature Granulocytes 1 %   Abs Immature Granulocytes 0.25 (H) 0.00 - 0.07 K/uL    Comment: Performed at [facility], [place] [address], [place], [place] [address]  Comprehensive metabolic panel     Status: Abnormal   Collection Time: 03/12/20  6:46 PM  Result Value Ref Range   Sodium 124 (L) 135 - 145 mmol/L   Potassium 5.5 (H) 3.5 - 5.1 mmol/L   Chloride 96 (L) 98 - 111 mmol/L   CO2 18 (L) 22 - 32 mmol/L   Glucose, Bld 253 (H) 70 - 99 mg/dL    Comment: Glucose reference range applies only to samples taken after fasting for at least 8 hours.   BUN 8 8 - 23 mg/dL   Creatinine, Ser 0.74 0.44 - 1.00 mg/dL   Calcium 7.6 (L) 8.9 - 10.3 mg/dL   Total Protein 5.7 (L) 6.5 - 8.1 g/dL   Albumin 2.8 (L) 3.5 - 5.0 g/dL   AST 21 15 - 41 U/L   ALT 22 0 - 44 U/L   Alkaline Phosphatase 150 (H) 38 - 126 U/L   Total Bilirubin 0.5 0.3 - 1.2 mg/dL   GFR, Estimated >60 >60 mL/min    Comment: (NOTE) Calculated using the CKD-EPI Creatinine Equation (2021)    Anion gap 10 5 - 15    Comment: Performed at [facility], [place] [address], [place], [place] [address]  Lipase, blood     Status: None   Collection Time: 03/12/20  6:46 PM  Result Value Ref Range   Lipase 28 11 - 51 U/L    Comment: Performed at [facility], [place] [address], [place], [place] [address]  Lactic acid, plasma     Status: Abnormal   Collection Time: 03/12/20  8:10 PM  Result Value Ref Range    Lactic Acid, Venous 2.2 (HH) 0.5 - 1.9 mmol/L    Comment: CRITICAL RESULT CALLED TO, READ BACK BY AND VERIFIED WITH: HOLLY, RN @ 2113 ON 03/12/20 Sandy Salaam Performed at [facility], [place] [address], [place], [place] [address]   Protime-INR     Status: None   Collection Time: 03/12/20  8:10 PM  Result Value Ref Range   Prothrombin Time 12.6 11.4 - 15.2 seconds   INR 1.0 0.8 - 1.2    Comment: (NOTE) INR goal varies based on device and disease states. Performed at [facility], [place] [address], [place], [place] [address]   APTT     Status: None   Collection Time: 03/12/20  8:10 PM  Result Value Ref Range   aPTT 26 24 - 36 seconds    Comment: Performed at [facility], [place] [address] [place], [place] [address]  Respiratory Panel by RT PCR (Flu A&B, Covid) - Nasopharyngeal Swab  Status: None   Collection Time: 03/12/20  8:42 PM   Specimen: Nasopharyngeal Swab  Result Value Ref Range   SARS Coronavirus 2 by RT PCR NEGATIVE NEGATIVE    Comment: (NOTE) SARS-CoV-2 target nucleic acids are NOT DETECTED.  The SARS-CoV-2 RNA is generally detectable in upper respiratoy specimens during the acute phase of infection. The lowest concentration of SARS-CoV-2 viral copies this assay can detect is 131 copies/mL. A negative result does not preclude SARS-Cov-2 infection and should not be used as the sole basis for treatment or other patient management decisions. A negative result Koenen occur with  improper specimen collection/handling, submission of specimen other than nasopharyngeal swab, presence of viral mutation(s) within the areas targeted by this assay, and inadequate number of viral copies (<131 copies/mL). A negative result must be combined with clinical observations, patient history, and epidemiological information. The expected result is Negative.  Fact Sheet for Patients:  PinkCheek.be  Fact Sheet  for Healthcare Providers:  GravelBags.it  This test is no t yet approved or cleared by the [place] FDA and  has been authorized for detection and/or diagnosis of SARS-CoV-2 by FDA under an Emergency Use Authorization (EUA). This EUA will remain  in effect (meaning this test can be used) for the duration of the COVID-19 declaration under Section 564(b)(1) of the Act, 21 U.S.C. section 360bbb-3(b)(1), unless the authorization is terminated or revoked sooner.     Influenza A by PCR NEGATIVE NEGATIVE   Influenza B by PCR NEGATIVE NEGATIVE    Comment: (NOTE) The Xpert Xpress SARS-CoV-2/FLU/RSV assay is intended as an aid in  the diagnosis of influenza from Nasopharyngeal swab specimens and  should not be used as a sole basis for treatment. Nasal washings and  aspirates are unacceptable for Xpert Xpress SARS-CoV-2/FLU/RSV  testing.  Fact Sheet for Patients: PinkCheek.be  Fact Sheet for Healthcare Providers: GravelBags.it  This test is not yet approved or cleared by the [place] FDA and  has been authorized for detection and/or diagnosis of SARS-CoV-2 by  FDA under an Emergency Use Authorization (EUA). This EUA will remain  in effect (meaning this test can be used) for the duration of the  Covid-19 declaration under Section 564(b)(1) of the Act, 21  U.S.C. section 360bbb-3(b)(1), unless the authorization is  terminated or revoked. Performed at [facility], [place] [address] [place], [place] [address]   Lactic acid, plasma     Status: None   Collection Time: 03/12/20 10:08 PM  Result Value Ref Range   Lactic Acid, Venous 1.9 0.5 - 1.9 mmol/L    Comment: Performed at [facility], [place] [address], [place], [place] [address]    CT ABDOMEN PELVIS WO CONTRAST  Result Date: 03/12/2020 CLINICAL DATA:  Acute generalized abdominal pain. EXAM: CT ABDOMEN AND  PELVIS WITHOUT CONTRAST TECHNIQUE: Multidetector CT imaging of the abdomen and pelvis was performed following the standard protocol without IV contrast. COMPARISON:  November 22, 2019. FINDINGS: Lower chest: No acute abnormality. Hepatobiliary: No focal liver abnormality is seen. Status post cholecystectomy. No biliary dilatation. Pancreas: Grossly stable 15 mm cystic abnormality is seen in pancreatic tail. No acute inflammation is noted. No ductal dilatation is noted. Spleen: Fluid is noted around the spleen. Adrenals/Urinary Tract: Adrenal glands are unremarkable. Kidneys are normal, without renal calculi, focal lesion, or hydronephrosis. Bladder is unremarkable. Stomach/Bowel: The stomach appears normal. Status post right hemicolectomy. Mildly dilated small bowel loops are noted proximally which most likely represent ileus. There does appear  to be a moderate amount of high density material in the right lower quadrant and posterior pelvis consistent with hemorrhage. Vascular/Lymphatic: Aortic atherosclerosis. No enlarged abdominal or pelvic lymph nodes. Reproductive: Status post hysterectomy. No adnexal masses. Other: No hernia is noted. Musculoskeletal: No acute or significant osseous findings. IMPRESSION: 1. Moderate amount of high density material is noted in the right lower quadrant and posterior pelvis consistent with hemorrhage. Critical Value/emergent results were called by telephone at the time of interpretation on 03/12/2020 at 8:12 pm to provider [facility] PATEL , who verbally acknowledged these results. 2. Mildly dilated small bowel loops are noted proximally which most likely represent ileus. 3. Grossly stable 15 mm cystic abnormality is seen in pancreatic tail. Follow-up with MRI in 1 year is recommended to ensure stability. 4. Aortic atherosclerosis. Aortic Atherosclerosis (ICD10-I70.0). Electronically Signed   By: Marijo Conception M.D.   On: 03/12/2020 20:12   DG Chest Port 1 View  Result Date:  03/12/2020 CLINICAL DATA:  Questionable sepsis, evaluate for abnormality, surgery 03/06/2020 for colon cancer EXAM: PORTABLE CHEST 1 VIEW COMPARISON:  CT 02/17/2020, radiograph 02/17/2020 FINDINGS: No consolidation, features of edema, pneumothorax, or effusion. Pulmonary vascularity is normally distributed. The aorta is calcified. The remaining cardiomediastinal contours are unremarkable. No acute osseous or soft tissue abnormality. Telemetry leads overlie the chest. IMPRESSION: No acute cardiopulmonary abnormality. Specifically, no focal consolidative opacity to suggest pneumonia. Electronically Signed   By: Lovena Le M.D.   On: 03/12/2020 19:59    ROS - all of the below systems have been reviewed with the patient and positives are indicated with bold text General: chills, fever or night sweats Eyes: blurry vision or double vision ENT: epistaxis or sore throat Allergy/Immunology: itchy/watery eyes or nasal congestion Hematologic/Lymphatic: bleeding problems, blood clots or swollen lymph nodes Endocrine: temperature intolerance or unexpected weight changes Breast: new or changing breast lumps or nipple discharge Resp: cough, shortness of breath, or wheezing CV: chest pain or dyspnea on exertion GI: as per HPI GU: dysuria, trouble voiding, or hematuria MSK: joint pain or joint stiffness Neuro: TIA or stroke symptoms Derm: pruritus and skin lesion changes Psych: anxiety and depression  PE Blood pressure 139/69, pulse 94, temperature (!) 97.4 F (36.3 C), resp. rate 17, height 5\' 8"  (1.727 m), SpO2 98 %. Constitutional: NAD; conversant; no deformities Eyes: Moist conjunctiva; no lid lag; anicteric; PERRL Neck: Trachea midline; no thyromegaly Lungs: Normal respiratory effort; no tactile fremitus CV: RRR; no palpable thrills; no pitting edema GI: Abd soft, nondistended fairly tender in right abdomen; incisions-clean, dry and intact; no palpable hepatosplenomegaly MSK: Normal gait; no  clubbing/cyanosis Psychiatric: Appropriate affect; alert and oriented x3 (person, city, state, date, month) Lymphatic: No palpable cervical or axillary lymphadenopathy Skin: A little pale, otherwise no rash, lesions or induration  Results for orders placed or performed during the hospital encounter of 03/12/20 (from the past 48 hour(s))  CBC with Differential     Status: Abnormal   Collection Time: 03/12/20  6:46 PM  Result Value Ref Range   WBC 23.0 (H) 4.0 - 10.5 K/uL   RBC 3.54 (L) 3.87 - 5.11 MIL/uL   Hemoglobin 9.0 (L) 12.0 - 15.0 g/dL   HCT 28.2 (L) 36 - 46 %   MCV 79.7 (L) 80.0 - 100.0 fL   MCH 25.4 (L) 26.0 - 34.0 pg   MCHC 31.9 30.0 - 36.0 g/dL   RDW 20.4 (H) 11.5 - 15.5 %   Platelets 484 (H) 150 - 400 K/uL  nRBC 0.0 0.0 - 0.2 %   Neutrophils Relative % 83 %   Neutro Abs 18.9 (H) 1.7 - 7.7 K/uL   Lymphocytes Relative 8 %   Lymphs Abs 1.8 0.7 - 4.0 K/uL   Monocytes Relative 7 %   Monocytes Absolute 1.7 (H) 0.1 - 1.0 K/uL   Eosinophils Relative 1 %   Eosinophils Absolute 0.3 0.0 - 0.5 K/uL   Basophils Relative 0 %   Basophils Absolute 0.1 0.0 - 0.1 K/uL   Immature Granulocytes 1 %   Abs Immature Granulocytes 0.25 (H) 0.00 - 0.07 K/uL    Comment: Performed at [facility], [place] [address], [place], [place] [address]  Comprehensive metabolic panel     Status: Abnormal   Collection Time: 03/12/20  6:46 PM  Result Value Ref Range   Sodium 124 (L) 135 - 145 mmol/L   Potassium 5.5 (H) 3.5 - 5.1 mmol/L   Chloride 96 (L) 98 - 111 mmol/L   CO2 18 (L) 22 - 32 mmol/L   Glucose, Bld 253 (H) 70 - 99 mg/dL    Comment: Glucose reference range applies only to samples taken after fasting for at least 8 hours.   BUN 8 8 - 23 mg/dL   Creatinine, Ser 0.74 0.44 - 1.00 mg/dL   Calcium 7.6 (L) 8.9 - 10.3 mg/dL   Total Protein 5.7 (L) 6.5 - 8.1 g/dL   Albumin 2.8 (L) 3.5 - 5.0 g/dL   AST 21 15 - 41 U/L   ALT 22 0 - 44 U/L   Alkaline Phosphatase 150 (H) 38 - 126 U/L    Total Bilirubin 0.5 0.3 - 1.2 mg/dL   GFR, Estimated >60 >60 mL/min    Comment: (NOTE) Calculated using the CKD-EPI Creatinine Equation (2021)    Anion gap 10 5 - 15    Comment: Performed at [facility], [place] [address], [place], [place] [address]  Lipase, blood     Status: None   Collection Time: 03/12/20  6:46 PM  Result Value Ref Range   Lipase 28 11 - 51 U/L    Comment: Performed at [facility], [place] [address] [place], [place] [address]  Lactic acid, plasma     Status: Abnormal   Collection Time: 03/12/20  8:10 PM  Result Value Ref Range   Lactic Acid, Venous 2.2 (HH) 0.5 - 1.9 mmol/L    Comment: CRITICAL RESULT CALLED TO, READ BACK BY AND VERIFIED WITH: HOLLY, RN @ 2113 ON 03/12/20 Sandy Salaam Performed at [facility], [place] [address], [place], [place] [address]   Protime-INR     Status: None   Collection Time: 03/12/20  8:10 PM  Result Value Ref Range   Prothrombin Time 12.6 11.4 - 15.2 seconds   INR 1.0 0.8 - 1.2    Comment: (NOTE) INR goal varies based on device and disease states. Performed at [facility], [place] [address], [place], [place] [address]   APTT     Status: None   Collection Time: 03/12/20  8:10 PM  Result Value Ref Range   aPTT 26 24 - 36 seconds    Comment: Performed at [facility], [place] [address] [place],  [address]  Respiratory Panel by RT PCR (Flu A&B, Covid) - Nasopharyngeal Swab     Status: None   Collection Time: 03/12/20  8:42 PM   Specimen: Nasopharyngeal Swab  Result Value Ref Range   SARS Coronavirus  2 by RT PCR NEGATIVE NEGATIVE    Comment: (NOTE) SARS-CoV-2 target nucleic acids are NOT DETECTED.  The SARS-CoV-2 RNA is generally detectable in upper respiratoy specimens during the acute phase of infection. The lowest concentration of SARS-CoV-2 viral copies this assay can detect is 131 copies/mL. A negative result does not preclude  SARS-Cov-2 infection and should not be used as the sole basis for treatment or other patient management decisions. A negative result Dilger occur with  improper specimen collection/handling, submission of specimen other than nasopharyngeal swab, presence of viral mutation(s) within the areas targeted by this assay, and inadequate number of viral copies (<131 copies/mL). A negative result must be combined with clinical observations, patient history, and epidemiological information. The expected result is Negative.  Fact Sheet for Patients:  PinkCheek.be  Fact Sheet for Healthcare Providers:  GravelBags.it  This test is no t yet approved or cleared by the [place] FDA and  has been authorized for detection and/or diagnosis of SARS-CoV-2 by FDA under an Emergency Use Authorization (EUA). This EUA will remain  in effect (meaning this test can be used) for the duration of the COVID-19 declaration under Section 564(b)(1) of the Act, 21 U.S.C. section 360bbb-3(b)(1), unless the authorization is terminated or revoked sooner.     Influenza A by PCR NEGATIVE NEGATIVE   Influenza B by PCR NEGATIVE NEGATIVE    Comment: (NOTE) The Xpert Xpress SARS-CoV-2/FLU/RSV assay is intended as an aid in  the diagnosis of influenza from Nasopharyngeal swab specimens and  should not be used as a sole basis for treatment. Nasal washings and  aspirates are unacceptable for Xpert Xpress SARS-CoV-2/FLU/RSV  testing.  Fact Sheet for Patients: PinkCheek.be  Fact Sheet for Healthcare Providers: GravelBags.it  This test is not yet approved or cleared by the [place] FDA and  has been authorized for detection and/or diagnosis of SARS-CoV-2 by  FDA under an Emergency Use Authorization (EUA). This EUA will remain  in effect (meaning this test can be used) for the duration of the  Covid-19  declaration under Section 564(b)(1) of the Act, 21  U.S.C. section 360bbb-3(b)(1), unless the authorization is  terminated or revoked. Performed at [facility], [place] [address], [place], [place] [address]   Lactic acid, plasma     Status: None   Collection Time: 03/12/20 10:08 PM  Result Value Ref Range   Lactic Acid, Venous 1.9 0.5 - 1.9 mmol/L    Comment: Performed at [facility], [place] [address] [place], [place] [address]    CT ABDOMEN PELVIS WO CONTRAST  Result Date: 03/12/2020 CLINICAL DATA:  Acute generalized abdominal pain. EXAM: CT ABDOMEN AND PELVIS WITHOUT CONTRAST TECHNIQUE: Multidetector CT imaging of the abdomen and pelvis was performed following the standard protocol without IV contrast. COMPARISON:  November 22, 2019. FINDINGS: Lower chest: No acute abnormality. Hepatobiliary: No focal liver abnormality is seen. Status post cholecystectomy. No biliary dilatation. Pancreas: Grossly stable 15 mm cystic abnormality is seen in pancreatic tail. No acute inflammation is noted. No ductal dilatation is noted. Spleen: Fluid is noted around the spleen. Adrenals/Urinary Tract: Adrenal glands are unremarkable. Kidneys are normal, without renal calculi, focal lesion, or hydronephrosis. Bladder is unremarkable. Stomach/Bowel: The stomach appears normal. Status post right hemicolectomy. Mildly dilated small bowel loops are noted proximally which most likely represent ileus. There does appear to be a moderate amount of high density material in the right lower quadrant and posterior pelvis consistent with hemorrhage. Vascular/Lymphatic: Aortic atherosclerosis. No  enlarged abdominal or pelvic lymph nodes. Reproductive: Status post hysterectomy. No adnexal masses. Other: No hernia is noted. Musculoskeletal: No acute or significant osseous findings. IMPRESSION: 1. Moderate amount of high density material is noted in the right lower quadrant and posterior pelvis consistent  with hemorrhage. Critical Value/emergent results were called by telephone at the time of interpretation on 03/12/2020 at 8:12 pm to provider [facility] PATEL , who verbally acknowledged these results. 2. Mildly dilated small bowel loops are noted proximally which most likely represent ileus. 3. Grossly stable 15 mm cystic abnormality is seen in pancreatic tail. Follow-up with MRI in 1 year is recommended to ensure stability. 4. Aortic atherosclerosis. Aortic Atherosclerosis (ICD10-I70.0). Electronically Signed   By: Marijo Conception M.D.   On: 03/12/2020 20:12   DG Chest Port 1 View  Result Date: 03/12/2020 CLINICAL DATA:  Questionable sepsis, evaluate for abnormality, surgery 03/06/2020 for colon cancer EXAM: PORTABLE CHEST 1 VIEW COMPARISON:  CT 02/17/2020, radiograph 02/17/2020 FINDINGS: No consolidation, features of edema, pneumothorax, or effusion. Pulmonary vascularity is normally distributed. The aorta is calcified. The remaining cardiomediastinal contours are unremarkable. No acute osseous or soft tissue abnormality. Telemetry leads overlie the chest. IMPRESSION: No acute cardiopulmonary abnormality. Specifically, no focal consolidative opacity to suggest pneumonia. Electronically Signed   By: Lovena Le M.D.   On: 03/12/2020 19:59    Imaging: Personally reviewed  A/P: Jezebel Pollet Artley is an 86 y.o. female with  Nausea, abdominal pain status post laparoscopic right hemicolectomy with adhesiolysis by Dr. Dema Severin November 3 Anemia -stable History of hypertension Diabetes mellitus type 2 Leukocytosis Hypothyroidism Hyponatremia -which [place] be chronic  Recommend admission to inpatient Bowel rest Gentle IV fluid hydration given her age, she had 2 L in the ER Repeat labs in the morning Start chemical DVT prophylaxis in the morning assuming a.m. hemoglobin is stable Pain and antiemetic medication as needed Sliding scale insulin Abrahamsen need [facility] consult for her general medical problems  I think the  fluid in the abdomen is old blood since she did bleed postoperatively requiring blood transfusion.  Her hemoglobin is stable so I do not think she is actively bleeding.  She had a noncontrasted CT but I do not see any evidence of perforation or free air  I am not sure if she is so tender just from the irritation of the peritoneum from the blood or another process  Could consider repeating her CT scan tomorrow with oral contrast  Follow-up urinalysis to see if that is the source of her leukocytosis In the interim we will continue empiric antibiotics for GI coverage  Follow-up sodium in the morning  We will alert Dr. Dema Severin in the morning  N.p.o. except meds, ice chips and sips of water tonight  Leighton Ruff. Redmond Pulling, MD, FACS General, Bariatric, & Minimally Invasive Surgery [facility], [place]

## 2020-03-12 NOTE — Progress Notes (Signed)
Subjective No acute events. Feeling much better now that no more IV potassium. Denies n/v. Having flatus and BMs. 1-2x/day. Tolerating diet.  Objective: Vital signs in last 24 hours: Temp:  [98.1 F (36.7 C)-98.5 F (36.9 C)] 98.2 F (36.8 C) (11/09 0618) Pulse Rate:  [68-86] 79 (11/09 0618) Resp:  [15-19] 16 (11/09 0618) BP: (132-179)/(62-84) 132/62 (11/09 0618) SpO2:  [97 %-100 %] 97 % (11/09 0618) Last BM Date: 03/11/20  Intake/Output from previous day: 11/08 0701 - 11/09 0700 In: 1935 [P.O.:640; I.V.:1200; IV Piggyback:95] Out: 0  Intake/Output this shift: No intake/output data recorded.  Gen: NAD, comfortable CV: RRR Pulm: Normal work of breathing Abd: Soft, not significantly tender; nondistended. Ext: SCDs in place  Lab Results: CBC  No results for input(s): WBC, HGB, HCT, PLT in the last 72 hours. BMET Recent Labs    03/11/20 0458 03/12/20 0524  NA 130* 130*  K 2.9* 4.7  CL 99 101  CO2 20* 19*  GLUCOSE 108* 81  BUN <5* 6*  CREATININE 0.54 0.61  CALCIUM 7.5* 7.9*   PT/INR No results for input(s): LABPROT, INR in the last 72 hours. ABG No results for input(s): PHART, HCO3 in the last 72 hours.  Invalid input(s): PCO2, PO2  Studies/Results:  Anti-infectives: Anti-infectives (From admission, onward)   Start     Dose/Rate Route Frequency Ordered Stop   03/06/20 1400  neomycin (MYCIFRADIN) tablet 1,000 mg  Status:  Discontinued       "And" Linked Group Details   1,000 mg Oral 3 times per day 03/06/20 0816 03/06/20 0829   03/06/20 1400  metroNIDAZOLE (FLAGYL) tablet 1,000 mg  Status:  Discontinued       "And" Linked Group Details   1,000 mg Oral 3 times per day 03/06/20 0816 03/06/20 0829   03/06/20 0832  clindamycin (CLEOCIN) 900 MG/50ML IVPB       Note to Pharmacy: Charmayne Sheer   : cabinet override      03/06/20 0832 03/06/20 1047   03/06/20 0600  gentamicin (GARAMYCIN) 280 mg in dextrose 5 % 100 mL IVPB       "And" Linked Group Details   280  mg 107 mL/hr over 60 Minutes Intravenous On call to O.R. 03/05/20 0845 03/06/20 1014   03/05/20 0845  clindamycin (CLEOCIN) IVPB 900 mg       "And" Linked Group Details   900 mg 100 mL/hr over 30 Minutes Intravenous 60 min pre-op 03/05/20 0845 03/06/20 1053       Assessment/Plan: Patient Active Problem List   Diagnosis Date Noted  . Cancer of ascending colon pT3pN0 (0/12 LN) s/p lap right colectomy 03/06/2020 03/09/2020  . History of multiple allergies 03/09/2020  . S/P right hemicolectomy 03/06/2020  . Closed fracture of shaft of tibia 02/12/2020  . Gait abnormality 12/04/2019  . Impaired left ventricular function 09/05/2019  . Hypokalemia   . Effusion of left knee joint   . Hyponatremia   . Left patella fracture 10/26/2018  . Fracture of patella 10/26/2018  . Acquired hypothyroidism 08/25/2017  . 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 Hammer toe 08/25/2017  . History of breast cancer 08/25/2017  . Nontoxic thyroid nodule 08/25/2017  . Osteopetrosis 08/25/2017  . Dysuria 01/19/2017  . Vitamin D deficiency 11/18/2016  . Tremor, essential 08/18/2016  . Convulsions/seizures ([place]) 10/16/2014  . Brain tumor (benign) ([place]) 10/18/2013  . Subdural hemorrhage ([place]) 10/09/2013  . Anxiety 10/09/2013  . Compression fracture of T12 vertebra (HCC)  10/09/2013  . Nontoxic multinodular goiter 09/05/2013  . Syncope 08/27/2013  . Carotid artery disease ([place]) 08/27/2013  . Abnormal thyroid ultrasound 08/27/2013  . Cancer of central portion of female breast ([place]) 12/30/2012  . Osteoporosis 03/06/2010  . ASTHMA 03/05/2009  . Asthma 03/05/2009  . IRRITABLE BOWEL SYNDROME  diarrhea type 03/21/2008  . ANEMIA-NOS 12/16/2006  . GERD 12/16/2006  . Insulin-requiring or dependent type II diabetes mellitus ([place]) 10/29/2006  . Mixed hyperlipidemia 10/29/2006  . Essential hypertension 10/29/2006  . Allergic rhinitis, cause unspecified 10/29/2006  . OSTEOARTHRITIS 10/29/2006   s/p  Procedure(s): LAPAROSCOPIC RIGHT HEMI COLECTOMY WITH TAP BLOCK AND LYSIS OF ADHESIONS 03/06/2020  -She states her sister is now here to assist in her care and she has arrangements for aids every day. She states the only place she will go is home.  -Home health arrangements made as well from our end -We have again reviewed expectations and plan -She wishes to go home today after lunch; this seems reasonable given burden of help she appears to have arranged for care at home as well -PPx: SQH, SCDs    LOS: 6 days   Nadeen Landau, MD [facility] Use [email or web address] to contact on call provider

## 2020-03-12 NOTE — Progress Notes (Signed)
Noted plans for discharge nothing new to add.  Home health arranged for discharge.  Electrolytes normal.  Patient tolerating p.o. intake.

## 2020-03-12 NOTE — ED Triage Notes (Signed)
Pt bib ems from home. Was d/c'ed from [facility] earlier today. Had surgery 11/3 for colon cancer. Endorses nausea, abd pain, weakness. Given 4mg  zofran with EMS. Per EMS, pt took insulin and "some pain medicine" prior to their arrival. Pt unable to tell how much she took.  EMS vitals: 130/64 HR96 99% on RA 97.31f BG 317

## 2020-03-12 NOTE — TOC Transition Note (Signed)
Transition of Care [facility]) - CM/SW Discharge Note   Patient Details  Name: [name] MRN: [id number] Date of Birth: [birth year]  [facility]) CM/SW Contact:  Lia Hopping, LCSW Phone Number: 03/12/2020, 10:54 AM   Clinical Narrative:   CSW confirm with nurse/pt. Medically stable to transfer Patient will transport home by PTAR. Patient sister Tonia Ghent will accept the patient at home.  [facility] staff Amy aware the patient will discharge today.  PTAR arranged.     Final next level of care: [place] Barriers to Discharge: Barriers Resolved   Patient Goals and CMS Choice Patient states their goals for this hospitalization and ongoing recovery are:: To get better to go home CMS Medicare.gov Compare Post Acute Care list provided to::  (Pt's niece Hoyle Sauer) Choice offered to / list presented to :  (To pt's niece Hoyle Sauer)  Discharge Placement                       Discharge Plan and Services   Discharge Planning Services: CM Consult                      HH Arranged: PT [facility] Agency: [facility] Date [place]: 03/08/20 Time [place]: 1518 Representative spoke with at [place]: Amy  Social Determinants of Health ([place]) Interventions     Readmission Risk Interventions No flowsheet data found.

## 2020-03-12 NOTE — ED Notes (Signed)
Date and time results received: 03/12/20 9:13 PM (use smartphrase ".now" to insert current time)  Test: Lactic Critical Value: 2.2  Name of Provider Notified: Deberah Pelton

## 2020-03-12 NOTE — Progress Notes (Signed)
A consult was received from an ED physician for vancomycin per pharmacy dosing (for an indication other than meningitis). The patient's profile has been reviewed for ht/wt/allergies/indication/available labs. A one time order has been placed for the above antibiotics.  Further antibiotics/pharmacy consults should be ordered by admitting physician if indicated.                       Reuel Boom, PharmD, BCPS [phone] 03/12/2020, 7:39 PM

## 2020-03-13 ENCOUNTER — Inpatient Hospital Stay (HOSPITAL_COMMUNITY): Payer: Medicare Other

## 2020-03-13 DIAGNOSIS — D649 Anemia, unspecified: Secondary | ICD-10-CM | POA: Diagnosis not present

## 2020-03-13 DIAGNOSIS — R109 Unspecified abdominal pain: Secondary | ICD-10-CM

## 2020-03-13 DIAGNOSIS — E871 Hypo-osmolality and hyponatremia: Secondary | ICD-10-CM

## 2020-03-13 DIAGNOSIS — I10 Essential (primary) hypertension: Secondary | ICD-10-CM | POA: Diagnosis not present

## 2020-03-13 DIAGNOSIS — R112 Nausea with vomiting, unspecified: Secondary | ICD-10-CM | POA: Diagnosis not present

## 2020-03-13 LAB — URINALYSIS, ROUTINE W REFLEX MICROSCOPIC
Bilirubin Urine: NEGATIVE
Glucose, UA: 50 mg/dL — AB
Hgb urine dipstick: NEGATIVE
Ketones, ur: 5 mg/dL — AB
Leukocytes,Ua: NEGATIVE
Nitrite: NEGATIVE
Protein, ur: NEGATIVE mg/dL
Specific Gravity, Urine: 1.013 (ref 1.005–1.030)
pH: 5 (ref 5.0–8.0)

## 2020-03-13 LAB — BASIC METABOLIC PANEL
Anion gap: 7 (ref 5–15)
Anion gap: 9 (ref 5–15)
BUN: 10 mg/dL (ref 8–23)
BUN: 12 mg/dL (ref 8–23)
CO2: 19 mmol/L — ABNORMAL LOW (ref 22–32)
CO2: 19 mmol/L — ABNORMAL LOW (ref 22–32)
Calcium: 7.6 mg/dL — ABNORMAL LOW (ref 8.9–10.3)
Calcium: 7.8 mg/dL — ABNORMAL LOW (ref 8.9–10.3)
Chloride: 99 mmol/L (ref 98–111)
Chloride: 101 mmol/L (ref 98–111)
Creatinine, Ser: 0.89 mg/dL (ref 0.44–1.00)
Creatinine, Ser: 1.22 mg/dL — ABNORMAL HIGH (ref 0.44–1.00)
GFR, Estimated: >60 mL/min (ref >60)
GFR, Estimated: 43 mL/min — ABNORMAL LOW (ref >60)
Glucose, Bld: 202 mg/dL — ABNORMAL HIGH (ref 70–99)
Glucose, Bld: 137 mg/dL — ABNORMAL HIGH (ref 70–99)
Potassium: 5.3 mmol/L — ABNORMAL HIGH (ref 3.5–5.1)
Potassium: 5.9 mmol/L — ABNORMAL HIGH (ref 3.5–5.1)
Sodium: 125 mmol/L — ABNORMAL LOW (ref 135–145)
Sodium: 129 mmol/L — ABNORMAL LOW (ref 135–145)

## 2020-03-13 LAB — CBC
HCT: 25.2 % — ABNORMAL LOW (ref 36.0–46.0)
Hemoglobin: 7.7 g/dL — ABNORMAL LOW (ref 12.0–15.0)
MCH: 25.3 pg — ABNORMAL LOW (ref 26.0–34.0)
MCHC: 30.6 g/dL (ref 30.0–36.0)
MCV: 82.9 fL (ref 80.0–100.0)
Platelets: 422 10*3/uL — ABNORMAL HIGH (ref 150–400)
RBC: 3.04 MIL/uL — ABNORMAL LOW (ref 3.87–5.11)
RDW: 21.2 % — ABNORMAL HIGH (ref 11.5–15.5)
WBC: 19.1 10*3/uL — ABNORMAL HIGH (ref 4.0–10.5)
nRBC: 0 % (ref 0.0–0.2)

## 2020-03-13 LAB — GLUCOSE, CAPILLARY
Glucose-Capillary: 189 mg/dL — ABNORMAL HIGH (ref 70–99)
Glucose-Capillary: 193 mg/dL — ABNORMAL HIGH (ref 70–99)
Glucose-Capillary: 149 mg/dL — ABNORMAL HIGH (ref 70–99)
Glucose-Capillary: 160 mg/dL — ABNORMAL HIGH (ref 70–99)
Glucose-Capillary: 127 mg/dL — ABNORMAL HIGH (ref 70–99)

## 2020-03-13 LAB — HEMOGLOBIN AND HEMATOCRIT, BLOOD
HCT: 32 % — ABNORMAL LOW (ref 36.0–46.0)
Hemoglobin: 10.8 g/dL — ABNORMAL LOW (ref 12.0–15.0)

## 2020-03-13 LAB — SODIUM, URINE, RANDOM: Sodium, Ur: 78 mmol/L

## 2020-03-13 LAB — PREPARE RBC (CROSSMATCH)

## 2020-03-13 MED ORDER — SODIUM CHLORIDE 0.9 % IV SOLN
1.0000 g | Freq: Two times a day (BID) | INTRAVENOUS | Status: DC
  Administered 2020-03-13 – 2020-03-14 (×3): 1 g via INTRAVENOUS
  Filled 2020-03-13 (×2): qty 1
  Filled 2020-03-13: qty 0.75
  Filled 2020-03-13: qty 1

## 2020-03-13 MED ORDER — PHENYTOIN 50 MG PO CHEW: 50.0000 mg | CHEWABLE_TABLET | Freq: Every day | ORAL | Status: DC

## 2020-03-13 MED ORDER — INSULIN ASPART 100 UNIT/ML ~~LOC~~ SOLN: 0.0000 [IU] | Freq: Every day | SUBCUTANEOUS | Status: DC

## 2020-03-13 MED ORDER — PHENYTOIN SODIUM EXTENDED 100 MG PO CAPS
200.0000 mg | ORAL_CAPSULE | Freq: Every day | ORAL | Status: DC
  Administered 2020-03-13 – 2020-03-20 (×7): 200 mg via ORAL
  Filled 2020-03-13 (×7): qty 2

## 2020-03-13 MED ORDER — PRIMIDONE 50 MG PO TABS
100.0000 mg | ORAL_TABLET | Freq: Every day | ORAL | Status: DC
  Administered 2020-03-13 – 2020-03-20 (×4): 100 mg via ORAL
  Filled 2020-03-13 (×8): qty 2

## 2020-03-13 MED ORDER — TRANDOLAPRIL-VERAPAMIL HCL ER 2-240 MG PO TBCR: 1.0000 | EXTENDED_RELEASE_TABLET | Freq: Every day | ORAL | Status: DC

## 2020-03-13 MED ORDER — PRIMIDONE 50 MG PO TABS
100.0000 mg | ORAL_TABLET | ORAL | Status: DC
Start: 2020-03-13 — End: 2020-03-13

## 2020-03-13 MED ORDER — CHLORHEXIDINE GLUCONATE CLOTH 2 % EX PADS
6.0000 | MEDICATED_PAD | Freq: Every day | CUTANEOUS | Status: DC
  Administered 2020-03-14 – 2020-03-15 (×2): 6 via TOPICAL

## 2020-03-13 MED ORDER — INSULIN ASPART 100 UNIT/ML ~~LOC~~ SOLN
0.0000 [IU] | Freq: Three times a day (TID) | SUBCUTANEOUS | Status: DC
  Administered 2020-03-13: 1 [IU] via SUBCUTANEOUS
  Administered 2020-03-13: 2 [IU] via SUBCUTANEOUS

## 2020-03-13 MED ORDER — INSULIN GLARGINE 100 UNIT/ML ~~LOC~~ SOLN
10.0000 [IU] | Freq: Every day | SUBCUTANEOUS | Status: DC
  Administered 2020-03-13 – 2020-03-18 (×4): 10 [IU] via SUBCUTANEOUS
  Filled 2020-03-13 (×6): qty 0.1

## 2020-03-13 MED ORDER — PHENYTOIN 50 MG PO CHEW
50.0000 mg | CHEWABLE_TABLET | Freq: Every day | ORAL | Status: DC
  Administered 2020-03-14 – 2020-03-21 (×7): 50 mg via ORAL
  Filled 2020-03-13 (×9): qty 1

## 2020-03-13 MED ORDER — SODIUM CHLORIDE 0.9 % IV SOLN
1.0000 g | Freq: Three times a day (TID) | INTRAVENOUS | Status: DC
  Administered 2020-03-13: 1 g via INTRAVENOUS
  Filled 2020-03-13 (×2): qty 1

## 2020-03-13 MED ORDER — TRANDOLAPRIL 2 MG PO TABS
2.0000 mg | ORAL_TABLET | Freq: Every day | ORAL | Status: DC
  Administered 2020-03-13 – 2020-03-20 (×7): 2 mg via ORAL
  Filled 2020-03-13 (×9): qty 1

## 2020-03-13 MED ORDER — PHENYTOIN SODIUM EXTENDED 100 MG PO CAPS: 100.0000 mg | ORAL_CAPSULE | ORAL | Status: DC

## 2020-03-13 MED ORDER — SODIUM CHLORIDE 0.9% IV SOLUTION: Freq: Once | INTRAVENOUS | Status: AC

## 2020-03-13 MED ORDER — VERAPAMIL HCL ER 240 MG PO TBCR
240.0000 mg | EXTENDED_RELEASE_TABLET | Freq: Every day | ORAL | Status: DC
  Administered 2020-03-13 – 2020-03-20 (×7): 240 mg via ORAL
  Filled 2020-03-13 (×9): qty 1

## 2020-03-13 MED ORDER — PRIMIDONE 50 MG PO TABS
150.0000 mg | ORAL_TABLET | Freq: Every day | ORAL | Status: DC
  Administered 2020-03-14 – 2020-03-21 (×7): 150 mg via ORAL
  Filled 2020-03-13 (×10): qty 3

## 2020-03-13 MED ORDER — MORPHINE SULFATE (PF) 2 MG/ML IV SOLN
1.0000 mg | Freq: Once | INTRAVENOUS | Status: AC
  Administered 2020-03-13: 1 mg via INTRAVENOUS
  Filled 2020-03-13: qty 1

## 2020-03-13 MED ORDER — PHENYTOIN SODIUM EXTENDED 100 MG PO CAPS
100.0000 mg | ORAL_CAPSULE | Freq: Every day | ORAL | Status: DC
  Administered 2020-03-14 – 2020-03-21 (×8): 100 mg via ORAL
  Filled 2020-03-13 (×8): qty 1

## 2020-03-13 MED ORDER — THIAMINE HCL 100 MG PO TABS
250.0000 mg | ORAL_TABLET | Freq: Every day | ORAL | Status: DC
  Administered 2020-03-13 – 2020-03-20 (×4): 250 mg via ORAL
  Filled 2020-03-13 (×4): qty 3

## 2020-03-13 NOTE — TOC Initial Note (Signed)
Transition of Care [facility]) - Initial/Assessment Note    Patient Details  Name: [name] MRN: [id number] Date of Birth: Oct 27, 1929  [facility]) CM/SW Contact:    Dessa Phi, RN Phone Number: 03/13/2020, 1:30 PM  Clinical Narrative: From home,alone-spoke to patient's sister in rm-d/c plan likely rehab-recent stay @ [facility](does not want to return). PT cons-await recc.                  Expected Discharge Plan: Skilled Nursing Facility Barriers to Discharge: Continued Medical Work up   Patient Goals and CMS Choice Patient states their goals for this hospitalization and ongoing recovery are:: go to rehab CMS Medicare.gov Compare Post Acute Care list provided to:: Patient Represenative (must comment) Choice offered to / list presented to : Patient, Sibling  Expected Discharge Plan and Services Expected Discharge Plan: [place]   Discharge Planning Services: CM Consult   Living arrangements for the past 2 months: Single Family Home                                      Prior Living Arrangements/Services Living arrangements for the past 2 months: Single Family Home Lives with:: Self Patient language and need for interpreter reviewed:: Yes Do you feel safe going back to the place where you live?: Yes      Need for Family Participation in Patient Care: No (Comment) Care giver support system in place?: Yes (comment) Current home services: DME (rw,3n1,w/c on order) Criminal Activity/Legal Involvement Pertinent to Current Situation/Hospitalization: No - Comment as needed  Activities of Daily Living Home Assistive Devices/Equipment: Environmental consultant (specify type), Bedside commode/3-in-1, Shower chair with back ADL Screening (condition at time of admission) Patient's cognitive ability adequate to safely complete daily activities?: Yes Is the patient deaf or have difficulty hearing?: No Does the patient have difficulty seeing, even when wearing  glasses/contacts?: No Does the patient have difficulty concentrating, remembering, or making decisions?: No Patient able to express need for assistance with ADLs?: Yes Does the patient have difficulty dressing or bathing?: No Independently performs ADLs?: Yes (appropriate for developmental age) Does the patient have difficulty walking or climbing stairs?: Yes Weakness of Legs: Left Weakness of Arms/Hands: None  Permission Sought/Granted Permission sought to share information with : Case Manager Permission granted to share information with : Yes, Verbal Permission Granted  Share Information with NAME: Case manager     Permission granted to share info w Relationship: Glenice Laine niece [phone]     Emotional Assessment Appearance:: Appears stated age Attitude/Demeanor/Rapport: Gracious Affect (typically observed): Accepting Orientation: : Oriented to Self, Oriented to Place, Oriented to  Time, Oriented to Situation Alcohol / Substance Use: Not Applicable Psych Involvement: No (comment)  Admission diagnosis:  Nausea & vomiting [R11.2] Sepsis without acute organ dysfunction, due to unspecified organism [facility]) [A41.9] Patient Active Problem List   Diagnosis Date Noted  . Nausea & vomiting 03/12/2020  . Cancer of ascending colon pT3pN0 (0/12 LN) s/p lap right colectomy 03/06/2020 03/09/2020  . History of multiple allergies 03/09/2020  . S/P right hemicolectomy 03/06/2020  . Closed fracture of shaft of tibia 02/12/2020  . Gait abnormality 12/04/2019  . Impaired left ventricular function 09/05/2019  . Hypokalemia   . Effusion of left knee joint   . Hyponatremia   . Left patella fracture 10/26/2018  . Fracture of patella 10/26/2018  . Acquired hypothyroidism 08/25/2017  .  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 Hammer toe 08/25/2017  . History of breast cancer 08/25/2017  . Nontoxic thyroid nodule 08/25/2017  . Osteopetrosis 08/25/2017  . Dysuria 01/19/2017   . Vitamin D deficiency 11/18/2016  . Tremor, essential 08/18/2016  . Convulsions/seizures ([place]) 10/16/2014  . Brain tumor (benign) ([place]) 10/18/2013  . Subdural hemorrhage ([place]) 10/09/2013  . Anxiety 10/09/2013  . Compression fracture of T12 vertebra ([place]) 10/09/2013  . Nontoxic multinodular goiter 09/05/2013  . Syncope 08/27/2013  . Carotid artery disease ([place]) 08/27/2013  . Abnormal thyroid ultrasound 08/27/2013  . Cancer of central portion of female breast ([place]) 12/30/2012  . Osteoporosis 03/06/2010  . ASTHMA 03/05/2009  . Asthma 03/05/2009  . IRRITABLE BOWEL SYNDROME  diarrhea type 03/21/2008  . ANEMIA-NOS 12/16/2006  . GERD 12/16/2006  . Insulin-requiring or dependent type II diabetes mellitus ([place]) 10/29/2006  . Mixed hyperlipidemia 10/29/2006  . Essential hypertension 10/29/2006  . Allergic rhinitis, cause unspecified 10/29/2006  . OSTEOARTHRITIS 10/29/2006   PCP:  Bartholome Bill, MD Pharmacy:   [facility] [phone] [place], [place] [address] [place] [address] Phone: [phone] Fax: [phone]  [facility] [place], [place] [address] [place] [address] Phone: [phone] Fax: [phone]     Social Determinants of Health ([place]) Interventions    Readmission Risk Interventions Readmission Risk Prevention Plan 03/13/2020  PCP or Specialist Appt within 3-5 Days Complete  HRI or [place] Complete  Social Work Consult for [place] Planning/Counseling Complete  Palliative Care Screening Complete  Medication Review Press photographer) Complete  Some recent data might be hidden

## 2020-03-13 NOTE — Progress Notes (Signed)
Pt has not urinated since arrival on unit at 2230. Pt assisted up to [facility] but unable to urinate. Bladder scan performed showing 331 mL. MD notified.

## 2020-03-13 NOTE — Progress Notes (Signed)
PHARMACY NOTE:  ANTIMICROBIAL RENAL DOSAGE ADJUSTMENT  Current antimicrobial regimen includes a mismatch between antimicrobial dosage and estimated renal function.  As per policy approved by the Pharmacy & Therapeutics and Medical Executive Committees, the antimicrobial dosage will be adjusted accordingly.  Current antimicrobial dosage:  Meropenem 1gm q8  Indication: Intra-abd infection  Renal Function:   Estimated Creatinine Clearance: 44.2 mL/min (by C-G formula based on SCr of 0.89 mg/dL). []      On intermittent HD, scheduled: []      On CRRT    Antimicrobial dosage has been changed to:  Meropenem 1gm q12  Thank you for allowing pharmacy to be a part of this patient's care.  Minda Ditto PharmD 03/13/2020 11:08 AM

## 2020-03-13 NOTE — Consult Note (Signed)
   [facility] Inpatient Consult   03/13/2020  [name] 12-27-1929 [id number]  Patient chart has been reviewed for readmissions less than 7 days and for high risk score for unplanned readmissions. Patient assessed for community [place] Management follow up needs.  Plan: Will continue to follow for progression and disposition plans.   Of note, [facility] Care Management services does not replace or interfere with any services that are arranged by inpatient case management or social work.  [name], MSN, [facility] Liaison Nurse Mobile Phone [phone]  Toll free office [phone]

## 2020-03-13 NOTE — Discharge Summary (Signed)
Patient ID: [name] MRN: [id number] DOB/AGE: 1929-11-20 86 y.o.  Admit date: 03/06/2020 Discharge date: 03/13/2020  Discharge Diagnoses Patient Active Problem List   Diagnosis Date Noted  . Nausea & vomiting 03/12/2020  . Cancer of ascending colon pT3pN0 (0/12 LN) s/p lap right colectomy 03/06/2020 03/09/2020  . History of multiple allergies 03/09/2020  . S/P right hemicolectomy 03/06/2020  . Closed fracture of shaft of tibia 02/12/2020  . Gait abnormality 12/04/2019  . Impaired left ventricular function 09/05/2019  . Hypokalemia   . Effusion of left knee joint   . Hyponatremia   . Left patella fracture 10/26/2018  . Fracture of patella 10/26/2018  . Acquired hypothyroidism 08/25/2017  . 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 Hammer toe 08/25/2017  . History of breast cancer 08/25/2017  . Nontoxic thyroid nodule 08/25/2017  . Osteopetrosis 08/25/2017  . Dysuria 01/19/2017  . Vitamin D deficiency 11/18/2016  . Tremor, essential 08/18/2016  . Convulsions/seizures ([place]) 10/16/2014  . Brain tumor (benign) ([place]) 10/18/2013  . Subdural hemorrhage ([place]) 10/09/2013  . Anxiety 10/09/2013  . Compression fracture of T12 vertebra ([place]) 10/09/2013  . Nontoxic multinodular goiter 09/05/2013  . Syncope 08/27/2013  . Carotid artery disease ([place]) 08/27/2013  . Abnormal thyroid ultrasound 08/27/2013  . Cancer of central portion of female breast ([place]) 12/30/2012  . Osteoporosis 03/06/2010  . ASTHMA 03/05/2009  . Asthma 03/05/2009  . IRRITABLE BOWEL SYNDROME  diarrhea type 03/21/2008  . ANEMIA-NOS 12/16/2006  . GERD 12/16/2006  . Insulin-requiring or dependent type II diabetes mellitus ([place]) 10/29/2006  . Mixed hyperlipidemia 10/29/2006  . Essential hypertension 10/29/2006  . Allergic rhinitis, cause unspecified 10/29/2006  . OSTEOARTHRITIS 10/29/2006    Consultants Internal medicine - comorbidity managemetn  Procedures Laparoscopic right hemicolectomy 03/06/20  with lysis of adhesions   Hospital Course: She was admitted postoperatively - had developed acute blood loss anemia and responded well to 2U PRBC. She initially had some bloody BMs as well. This resolved. Her diet was gradually advanced. She initially had nausea that resolved. She was offered/recommended returning to SNF for further recovery but refused and stated the only location she would go is home. Home health was arranged. She also arranged for her sister Tonia Ghent to assist in her care/around the home. She was having bowel function, tolerating diet, mobilizing to commode without difficulty. It was her opinion she would be perfectly fine at home with the care she had arranged. She was comfortable with and stable for discharge and additionally cleared for this by our hospitalist service. See notes from 03/12/20.   Allergies as of 03/12/2020      Reactions   Bystolic [nebivolol Hcl] Other (See Comments)   "extreme weakness, heaviness in legs & arms, swelling in legs/arms/face, swollen abdomen, pain in bladder, feet pain, soreness in chest"   Cholestyramine    "itching rash on stomach, bloated, nausea, vomiting, sleeplessness, extreme pain in arms"   Hydrazine Yellow [tartrazine] Other (See Comments)   "does not reduce high blood pressure, pain in arm, high pressure, felt like I was on verge of heart attack, really weak"   Morphine Other (See Comments)   Feels morbid, weak, still in pain   Niacin Palpitations   Fast heart beat   Niaspan [niacin Er] Palpitations, Other (See Comments)   "fast heart beat, high blood pressure"   Norvasc [amlodipine Besylate] Other (See Comments)   "extreme fluid retention/pain)   Optivar [azelastine Hcl] Photosensitivity   Repaglinide Hives   Sular [  nisoldipine Er] Other (See Comments)   "severe headaches, swelling eyes, hands, feet, shortness of breath, weak, flushed face, brain boiling, fluid retention, high blood sugar, nervous, heart fast beating"   Tegretol  [carbamazepine] Other (See Comments)   Blood poisoning   Telmisartan Other (See Comments)   "headache, difficulty urinating, high blood sugar, fluid retention"   Amoxicillin-pot Clavulanate Rash   Cefdinir Swelling   Vaginal irritation, breathing,    Ciprofloxacin Hcl Hives   Clonidine Other (See Comments)   Dry mouth, fluid retention   Clonidine Hydrochloride Other (See Comments)   Dry mouth, fluid retention   Codeine Nausea And Vomiting   Ezetimibe Other (See Comments)   Made weak   Hydroxychloroquine Other (See Comments)   Low platlets   Naproxen Other (See Comments)   Shrinks bladder   Sulfa Antibiotics Rash   Ziac [bisoprolol-hydrochlorothiazide] Other (See Comments)   "stopped urination"   Azelastine Other (See Comments)   Elavil [amitriptyline] Other (See Comments)   Gave Pt nightmares   Empagliflozin Other (See Comments)   "Caused yeast infection, slowed my urine"   Hydralazine    Iodine    Kenalog [triamcinolone] Diarrhea   Keppra [levetiracetam] Other (See Comments)   Shaking   Lamictal [lamotrigine]    itching   Lyrica [pregabalin] Swelling   Pseudoephedrine    Topamax [topiramate]    Dry eyes   Ace Inhibitors Other (See Comments)   unknown   Actonel [risedronate Sodium] Other (See Comments)   unknown   Amlodipine Besylate Other (See Comments)   unknown   Aspirin Other (See Comments)   unknown   Atacand [candesartan] Other (See Comments)   unknown   Bextra [valdecoxib] Other (See Comments)   unknown   Bisoprolol-hydrochlorothiazide Other (See Comments)   unknown   Candesartan Cilexetil Other (See Comments)   unknown   Cefadroxil Other (See Comments)   unknown   Celecoxib Rash   Hydrocodone Other (See Comments)   unknown   Hydrocodone-acetaminophen Other (See Comments)   unknown   Iodinated Diagnostic Agents Rash   "All over" "All over"   Meloxicam Other (See Comments)   unknown   Methylprednisolone Sodium Succinate Other (See Comments)    unknown   Nabumetone Other (See Comments)   unknown   Penicillins Other (See Comments)   unknown   Pseudoephedrine-guaifenesin Other (See Comments)   unknown   Risedronate Sodium Other (See Comments)   unknown   Rofecoxib Other (See Comments)   unknown   Ru-tuss [chlorphen-pse-atrop-hyos-scop] Other (See Comments)   unknown   Sulfonamide Derivatives Other (See Comments)   unknown   Sulphur [sulfur] Other (See Comments)   unknown   Telithromycin Other (See Comments)   unknown   Terfenadine Other (See Comments)   unknown   Trandolapril-verapamil Hcl Er Other (See Comments)   Headache, difficulty urinating, high blood sugar, fluid retention Pt is taking Tarka (trandolapril-verapamil) currently, but requests the medication stay in her allergy list   Trandolapril-verapamil Hcl Er Other (See Comments)   Headache, difficulty urinating, high blood sugar, fluid retention Pt is taking Tarka (trandolapril-verapamil) currently, but requests the medication stay in her allergy list   Valium [diazepam] Other (See Comments)   Makes her mean and hyper      Medication List    STOP taking these medications   metroNIDAZOLE 500 MG tablet Commonly known as: FLAGYL   neomycin 500 MG tablet Commonly known as: MYCIFRADIN   traMADol 50 MG tablet Commonly known as: Veatrice [place]  TAKE these medications   ALPRAZolam 0.25 MG tablet Commonly known as: XANAX TAKE 1 TABLET AT BEDTIME AS NEEDED (CAN TAKE 1 TABLET DURING THE DAY IF NEEDED FOR ANXIETY) What changed:   how much to take  how to take this  when to take this  reasons to take this  additional instructions   AZO CRANBERRY PO Take 1 tablet by mouth daily.   Biotin 1000 MCG tablet Take 1,000 mcg by mouth daily.   cholestyramine light 4 g packet Commonly known as: PREVALITE Take 0.5 packets by mouth in the morning and at bedtime.   cyanocobalamin 1000 MCG/ML injection Commonly known as: (VITAMIN B-12) INJECT 1 ML  INTRAMUSCULARLY EVERY 21 DAYS What changed: See the new instructions.   diclofenac Sodium 1 % Gel Commonly known as: VOLTAREN Apply 2 g topically 4 (four) times daily as needed (pain.).   diphenhydrAMINE 25 MG tablet Commonly known as: BENADRYL Take 25-50 mg by mouth every 6 (six) hours as needed (runny nose/allergies.).   docusate sodium 100 MG capsule Commonly known as: COLACE Take 200 mg by mouth 2 (two) times daily as needed (constipation.).   escitalopram 10 MG tablet Commonly known as: LEXAPRO Take 10 mg by mouth daily.   glucose blood test strip Use to check blood sugar 1 time per day.   insulin aspart 100 UNIT/ML FlexPen Commonly known as: NOVOLOG Inject 2-3 Units into the skin 3 (three) times daily as needed (blood sugars greater than 150=2 units & 200=3 units). Sliding Scale Insulin   insulin glargine 100 UNIT/ML injection Commonly known as: LANTUS Inject 14 Units into the skin at bedtime.   Insulin Pen Needle 32G X 4 MM Misc Used to inject insulin 3x daily   levothyroxine 75 MCG tablet Commonly known as: SYNTHROID Take 75 mcg by mouth daily before breakfast.   lidocaine 5 % Commonly known as: LIDODERM Place 1 patch onto the skin daily. Remove & Discard patch within 12 hours or as directed by MD What changed:   how much to take  when to take this  reasons to take this   oxyCODONE 5 MG immediate release tablet Commonly known as: Roxicodone Take 1 tablet (5 mg total) by mouth every 6 (six) hours as needed for up to 5 days (severe pain not controlled with tylenol first - do not take with tramadol).   phenytoin 100 MG ER capsule Commonly known as: DILANTIN Take 1 capsule (100 mg) in the morning and Take 2 capsules (200 mg) at bedtime What changed:   how much to take  how to take this  when to take this  additional instructions   phenytoin 50 MG tablet Commonly known as: DILANTIN Chew 50 mg by mouth daily. Total morning dose=150 mg   primidone  50 MG tablet Commonly known as: MYSOLINE Three tablets in the morning and one at dinner time What changed:   how much to take  how to take this  when to take this  additional instructions   Prodigy Voice Blood Glucose w/Device Kit Use to check blood sugar 1 time per day.   silver sulfADIAZINE 1 % cream Commonly known as: SILVADENE Apply 1 application topically daily as needed (itching.).   SYSTANE OP Place 1 drop into both eyes in the morning, at noon, in the evening, and at bedtime.   trandolapril-verapamil 2-240 MG tablet Commonly known as: TARKA Take 1 tablet by mouth at bedtime.   vitamin B-1 250 MG tablet Take 250 mg by mouth  at bedtime.   Vitamin D-3 125 MCG (5000 UT) Tabs Take 5,000 Units by mouth in the morning and at bedtime.         Follow-up Information    Ileana Roup, MD Follow up in 2 week(s).   Specialty: General Surgery Contact information: [address] [address] [phone]        [facility] Follow up.   Specialty: [place] Why: Please call the above number if you have any questions or concerns. the Agency will provide Home Health Physical Therapy.  Contact information: [place] [address] [phone]               Makaiya Geerdes M. Dema Severin, M.D. [facility]

## 2020-03-13 NOTE — Progress Notes (Signed)
PT Cancellation Note  Patient Details Name: [name] MRN: [id number] DOB: 1929/12/09   Cancelled Treatment:    Reason Eval/Treat Not Completed: Medical issues which prohibited therapy, has been medicated for pain.    Claretha Cooper 03/13/2020, 4:43 PM [place] Pager [phone] Office [phone]

## 2020-03-13 NOTE — Plan of Care (Signed)
  Problem: Pain Managment: Goal: General experience of comfort will improve Outcome: Progressing   

## 2020-03-13 NOTE — Progress Notes (Signed)
Subjective Soreness in abdomen is intervally improved from overnight. She denies emesis; some nausea however. She has been passing flatus overnight - reports large amount of flatus this morning. Has not voided   Objective: Vital signs in last 24 hours: Temp:  [97.4 F (36.3 C)-97.8 F (36.6 C)] 97.6 F (36.4 C) (11/10 0717) Pulse Rate:  [89-110] 101 (11/10 0717) Resp:  [12-24] 19 (11/10 0717) BP: (102-173)/(64-84) 126/71 (11/10 0717) SpO2:  [92 %-100 %] 92 % (11/10 0717) Weight:  [65 kg] 65 kg (11/09 2245) Last BM Date: 03/12/20  Intake/Output from previous day: 11/09 0701 - 11/10 0700 In: 3047 [I.V.:647; IV Piggyback:2400] Out: -  Intake/Output this shift: No intake/output data recorded.  Gen: NAD, comfortable, mentation appropriate - A&O x3 CV: RRR Pulm: Normal work of breathing Abd: Soft, ttp in RLQ; no left sided tenderness; no rebound nor guarding. Incisions c/d/i without erythema or drainage. Ext: SCDs in place  Lab Results: CBC  Recent Labs    03/12/20 1846 03/13/20 0525  WBC 23.0* 19.1*  HGB 9.0* 7.7*  HCT 28.2* 25.2*  PLT 484* 422*   BMET Recent Labs    03/12/20 1846 03/13/20 0525  NA 124* 125*  K 5.5* 5.3*  CL 96* 99  CO2 18* 19*  GLUCOSE 253* 202*  BUN 8 10  CREATININE 0.74 0.89  CALCIUM 7.6* 7.6*   PT/INR Recent Labs    03/12/20 2010  LABPROT 12.6  INR 1.0   ABG No results for input(s): PHART, HCO3 in the last 72 hours.  Invalid input(s): PCO2, PO2  Studies/Results:  Anti-infectives: Anti-infectives (From admission, onward)   Start     Dose/Rate Route Frequency Ordered Stop   03/13/20 0945  meropenem (MERREM) 1 g in sodium chloride 0.9 % 100 mL IVPB        1 g 200 mL/hr over 30 Minutes Intravenous Every 8 hours 03/13/20 0854     03/12/20 2200  meropenem (MERREM) 1 g in sodium chloride 0.9 % 100 mL IVPB        1 g 200 mL/hr over 30 Minutes Intravenous  Once 03/12/20 2150 03/12/20 2355   03/12/20 1945  metroNIDAZOLE (FLAGYL) IVPB  500 mg        500 mg 100 mL/hr over 60 Minutes Intravenous  Once 03/12/20 1936 03/12/20 2133   03/12/20 1945  vancomycin (VANCOCIN) IVPB 1000 mg/200 mL premix        1,000 mg 200 mL/hr over 60 Minutes Intravenous  Once 03/12/20 1936 03/12/20 2133       Assessment/Plan: Patient Active Problem List   Diagnosis Date Noted  . Nausea & vomiting 03/12/2020  . Cancer of ascending colon pT3pN0 (0/12 LN) s/p lap right colectomy 03/06/2020 03/09/2020  . History of multiple allergies 03/09/2020  . S/P right hemicolectomy 03/06/2020  . Closed fracture of shaft of tibia 02/12/2020  . Gait abnormality 12/04/2019  . Impaired left ventricular function 09/05/2019  . Hypokalemia   . Effusion of left knee joint   . Hyponatremia   . Left patella fracture 10/26/2018  . Fracture of patella 10/26/2018  . Acquired hypothyroidism 08/25/2017  . 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 Hammer toe 08/25/2017  . History of breast cancer 08/25/2017  . Nontoxic thyroid nodule 08/25/2017  . Osteopetrosis 08/25/2017  . Dysuria 01/19/2017  . Vitamin D deficiency 11/18/2016  . Tremor, essential 08/18/2016  . Convulsions/seizures ([place]) 10/16/2014  . Brain tumor (benign) ([place]) 10/18/2013  . Subdural hemorrhage ([place]) 10/09/2013  .  Anxiety 10/09/2013  . Compression fracture of T12 vertebra ([place]) 10/09/2013  . Nontoxic multinodular goiter 09/05/2013  . Syncope 08/27/2013  . Carotid artery disease ([place]) 08/27/2013  . Abnormal thyroid ultrasound 08/27/2013  . Cancer of central portion of female breast ([place]) 12/30/2012  . Osteoporosis 03/06/2010  . ASTHMA 03/05/2009  . Asthma 03/05/2009  . IRRITABLE BOWEL SYNDROME  diarrhea type 03/21/2008  . ANEMIA-NOS 12/16/2006  . GERD 12/16/2006  . Insulin-requiring or dependent type II diabetes mellitus ([place]) 10/29/2006  . Mixed hyperlipidemia 10/29/2006  . Essential hypertension 10/29/2006  . Allergic rhinitis, cause unspecified 10/29/2006  .  OSTEOARTHRITIS 10/29/2006    -CT with PO contrast to ensure no contrast extravasation from anastomosis. The CT she had in ER shows no evident free air and fluid density that is favored to be blood -Hgb 7.7 - she is fairly pale and feeling weak/fatigued. Mentating normally. We will proceed with PRBC transfusion given acute symptomatic blood loss anemia. -NPO, MIVF -Urinary retention - placing foley; UA is pending -Empiric IV abx -PPx: SCDs; holding chemical dvt ppx in light of above -I was not able to reach her sister Tonia Ghent today but was able to speak with her niece, Hoyle Sauer. We reviewed all of the above with her. She expressed understanding   LOS: 1 day   Sharon Mt. Dema Severin, M.D. [facility] Use [email or web address] to contact on call provider

## 2020-03-13 NOTE — Progress Notes (Signed)
Initial Nutrition Assessment  DOCUMENTATION CODES:   Not applicable  INTERVENTION:  - diet advancement as medically feasible.  - will complete NFPE at follow-up.   NUTRITION DIAGNOSIS:   Increased nutrient needs related to acute illness, cancer and cancer related treatments, post-op healing as evidenced by estimated needs.  GOAL:   Patient will meet greater than or equal to 90% of their needs  MONITOR:   Diet advancement, Labs, Weight trends  REASON FOR ASSESSMENT:   Malnutrition Screening Tool  ASSESSMENT:   86 y.o. female with medical history of chronic hyponatremia, type 2 DM, hypothyroidism, HTN, hyperlipidemia, left tib/fib fracture, breast cancer, brain tumor s/p surgery complicated with seizures, and colon cancer. She was hospitalized 11/3-11/9 for lap R hemicolectomy with extensive adhesions on 11/3 due to colon cancer. She returned to the ED due to persistent generalized weakness, nausea, and abdominal pain. In the ED, she was found to have SIRS and CT showed mild small bowel dilatation and fluid consistent with blood.  She has been NPO since admission. Unable to see patient this afternoon and no answer to attempted phone call this evening; noted patient's current location is listed as CT.   Patient was not seen by a RD during most recent admission or by a [place] RD at any time in the past.  Weight yesterday was 143 lb and this is the highest weight recorded since at least 02/06/19. No information documented in the edema section of flow sheet.   Per notes: - admitted due to N/V    Labs reviewed; CBGs: 193, 149, 160 mg/dl, Na: 125 mmol/l, K: 5.2 mmol/l, Ca: 7.6 mg/dl. Medications reviewed; 75 mcg oral synthroid/day, 250 mg thiamine/day. IVF; NS @ 100 ml/hr.     NUTRITION - FOCUSED PHYSICAL EXAM:  unable to complete at this time.   Diet Order:   Diet Order            Diet NPO time specified Except for: Ice Chips, Sips with Meds  Diet effective now                  EDUCATION NEEDS:   No education needs have been identified at this time  Skin:  Skin Assessment: Reviewed RN Assessment  Last BM:  11/9  Height:   Ht Readings from Last 1 Encounters:  03/12/20 5' 7.5" (1.715 m)    Weight:   Wt Readings from Last 1 Encounters:  03/12/20 65 kg     Estimated Nutritional Needs:  Kcal:  1820-1950 kcal Protein:  90-100 grams Fluid:  >/= 2 L/day     Jarome Matin, MS, RD, LDN, CNSC Inpatient Clinical Dietitian RD pager # available in AMION  After hours/weekend pager # available in [facility]

## 2020-03-13 NOTE — Progress Notes (Signed)
Observed and assisted foley cath placement with student

## 2020-03-13 NOTE — Consult Note (Signed)
Medical Consultation   [name]  WJX:[id number]  DOB: [birth year]  DOA: 03/12/2020  PCP: Bartholome Bill, MD     Requesting physician: Dr. Dema Severin, General Surgery  Reason for consultation: Medical comanagement    History of Present Illness: [name] is an 86 y.o. female with a past medical history of chronic hyponatremia, type 2 diabetes on insulin, hypothyroidism, hypertension, hyperlipidemia, left tib/fib fracture, breast cancer, brain tumor s/p surgery complicated with seizures, colon cancer followed by neurology outpatient, recent hospitalization from 11/3-11/9 for laparoscopic right hemicolectomy with extensive adhesiolysis by Dr. Dema Severin on 11/3 for colon cancer complicated by postop bleeding requiring blood transfusions who was discharged yesterday but had persistent generalized weakness, nausea and abdominal pain causing her to return to the ED soon after discharge.  She was seen by Dr. Redmond Pulling, general surgery, in the ED after she was found to have a SIRS type picture in the ED.  CT imaging was performed which showed mild small bowel dilatation and fluid within the abdomen consistent with blood however her Hb was stable from discharge but now with leukocytosis.  She was given fluids and started on empiric antibiotics.  CXR was unremarkable and initial lactate was elevated but repeat was normal.  Given her multiple medical comorbidities, TRH was consulted.  Currently she admits to persistent generalized weakness and abdominal pain not relieved by fentanyl with nausea without vomiting.  Sister at bedside states the patient had a black BM yesterday.  Otherwise no other complaints  Review of Systems:  Review of Systems  All other systems reviewed and are negative.  As per HPI otherwise 10 point review of systems negative.    Past Medical History: Past Medical History:  Diagnosis Date  . Anemia   . Anxiety   . Asthma   . Breast cancer ([place]) 2014   right breast   . Cancer of right breast ([place]) 12/26/12   right breast 12:00 o'clock, DCIS  . Carotid artery disease ([place])   . Carpal tunnel syndrome, bilateral   . Chronic bronchitis ([place])   . Chronic cough   . Chronic facial pain   . Chronic foot pain   . Colon cancer ([facility]) dx'd 11/2019  . Complication of anesthesia    Sore jaw; could not chew or move mouth, prolonged sedation  . Convulsions/seizures ([place]) 10/16/2014  . Diabetes mellitus    type 2 niddm x 20 years  . Dyslipidemia   . Ejection fraction   . Gait abnormality 12/04/2019  . GERD (gastroesophageal reflux disease)   . Hammer toe    bilateral  . History of colonic polyps   . History of meningioma   . HTN (hypertension)   . Hx of radiation therapy 03/07/13- 03/29/13   right breast 4250 cGy 17 sessions  . Hyperlipidemia   . Hypokalemia   . Hyponatremia   . Hypothyroidism   . IBS (irritable bowel syndrome)   . Melanoma ([place])   . Metatarsal bone fracture 2014  . Multiple drug allergies   . Nontoxic thyroid nodule   . Obesity   . Osteoarthritis   . Osteoporosis   . Palpitations   . Personal history of radiation therapy 2014  . Seasonal allergies   . Skin cancer   . Syncope   . Tremor, essential 08/18/2016  . Vitamin B12 deficiency   . Vitamin D deficiency     Past Surgical History: Past  Surgical History:  Procedure Laterality Date  . ABDOMINAL HYSTERECTOMY    . BIOPSY  11/07/2019   Procedure: BIOPSY;  Surgeon: Ronald Lobo, MD;  Location: [facility];  Service: Endoscopy;;  . BRAIN SURGERY    . BREAST BIOPSY Right 01/24/2013   Procedure: RE-EXCICION OF BREAST CANCER, ANTERIOR MARGINS;  Surgeon: Edward Jolly, MD;  Location: [facility];  Service: General;  Laterality: Right;  . BREAST LUMPECTOMY Right 2014  . BREAST LUMPECTOMY WITH NEEDLE LOCALIZATION Right 01/17/2013   Procedure: BREAST LUMPECTOMY WITH NEEDLE LOCALIZATION;  Surgeon: Edward Jolly, MD;  Location: [facility];  Service: General;  Laterality: Right;  .  BUNIONECTOMY Bilateral   . CATARACT EXTRACTION W/ INTRAOCULAR LENS IMPLANT Right   . CHOLECYSTECTOMY    . COLONOSCOPY    . COLONOSCOPY WITH PROPOFOL N/A 11/07/2019   Procedure: COLONOSCOPY WITH PROPOFOL;  Surgeon: Ronald Lobo, MD;  [facility];  Service: Endoscopy;  Laterality: N/A;  . CRANIOTOMY Right 10/18/2013   Procedure: CRANIOTOMY TUMOR EXCISION;  Surgeon: Floyce Stakes, MD;  [facility];  Service: Neurosurgery;  Laterality: Right;  . ESOPHAGOGASTRODUODENOSCOPY (EGD) WITH PROPOFOL N/A 11/07/2019   Procedure: ESOPHAGOGASTRODUODENOSCOPY (EGD) WITH PROPOFOL;  Surgeon: Ronald Lobo, MD;  [facility];  Service: Endoscopy;  Laterality: N/A;  . EYE SURGERY    . HERNIA REPAIR    . KNEE ARTHROSCOPY Bilateral   . LAPAROSCOPIC RIGHT HEMI COLECTOMY Right 03/06/2020   Procedure: LAPAROSCOPIC RIGHT HEMI COLECTOMY WITH TAP BLOCK AND LYSIS OF ADHESIONS;  Surgeon: Ileana Roup, MD;  Location[facility];  Service: General;  Laterality: Right;  . POLYPECTOMY     small adenomatous  . ULNAR TUNNEL RELEASE       Allergies:   Allergies  Allergen Reactions  . Bystolic [Nebivolol Hcl] Other (See Comments)    "extreme weakness, heaviness in legs & arms, swelling in legs/arms/face, swollen abdomen, pain in bladder, feet pain, soreness in chest"  . Cholestyramine     "itching rash on stomach, bloated, nausea, vomiting, sleeplessness, extreme pain in arms"  . Hydrazine Yellow [Tartrazine] Other (See Comments)    "does not reduce high blood pressure, pain in arm, high pressure, felt like I was on verge of heart attack, really weak"  . Morphine Other (See Comments)    Feels morbid, weak, still in pain  . Niacin Palpitations    Fast heart beat  . Niaspan [Niacin Er] Palpitations and Other (See Comments)    "fast heart beat, high blood pressure"  . Norvasc [Amlodipine Besylate] Other (See Comments)    "extreme fluid retention/pain)  . Optivar [Azelastine Hcl]  Photosensitivity  . Repaglinide Hives  . Sular [Nisoldipine Er] Other (See Comments)    "severe headaches, swelling eyes, hands, feet, shortness of breath, weak, flushed face, brain boiling, fluid retention, high blood sugar, nervous, heart fast beating"  . Tegretol [Carbamazepine] Other (See Comments)    Blood poisoning   . Telmisartan Other (See Comments)    "headache, difficulty urinating, high blood sugar, fluid retention"  . Amoxicillin-Pot Clavulanate Rash  . Cefdinir Swelling    Vaginal irritation, breathing,   . Ciprofloxacin Hcl Hives  . Clonidine Other (See Comments)    Dry mouth, fluid retention  . Clonidine Hydrochloride Other (See Comments)    Dry mouth, fluid retention  . Codeine Nausea And Vomiting  . Ezetimibe Other (See Comments)    Made weak  . Hydroxychloroquine Other (See Comments)    Low platlets  . Naproxen Other (  See Comments)    Shrinks bladder  . Sulfa Antibiotics Rash  . Ziac [Bisoprolol-Hydrochlorothiazide] Other (See Comments)    "stopped urination"  . Azelastine Other (See Comments)  . Elavil [Amitriptyline] Other (See Comments)    Gave Pt nightmares  . Empagliflozin Other (See Comments)    "Caused yeast infection, slowed my urine"  . Hydralazine   . Iodine   . Kenalog [Triamcinolone] Diarrhea  . Keppra [Levetiracetam] Other (See Comments)    Shaking  . Lamictal [Lamotrigine]     itching  . Lyrica [Pregabalin] Swelling  . Pseudoephedrine   . Topamax [Topiramate]     Dry eyes  . Ace Inhibitors Other (See Comments)    unknown  . Actonel [Risedronate Sodium] Other (See Comments)    unknown  . Amlodipine Besylate Other (See Comments)    unknown  . Aspirin Other (See Comments)    unknown  . Atacand [Candesartan] Other (See Comments)    unknown  . Bextra [Valdecoxib] Other (See Comments)    unknown  . Bisoprolol-Hydrochlorothiazide Other (See Comments)    unknown  . Candesartan Cilexetil Other (See Comments)    unknown  . Cefadroxil  Other (See Comments)    unknown  . Celecoxib Rash  . Hydrocodone Other (See Comments)    unknown  . Hydrocodone-Acetaminophen Other (See Comments)    unknown  . Iodinated Diagnostic Agents Rash    "All over" "All over"  . Meloxicam Other (See Comments)    unknown  . Methylprednisolone Sodium Succinate Other (See Comments)    unknown  . Nabumetone Other (See Comments)    unknown  . Penicillins Other (See Comments)    unknown  . Pseudoephedrine-Guaifenesin Other (See Comments)    unknown  . Risedronate Sodium Other (See Comments)    unknown  . Rofecoxib Other (See Comments)    unknown  . Ru-Tuss [Chlorphen-Pse-Atrop-Hyos-Scop] Other (See Comments)    unknown  . Sulfonamide Derivatives Other (See Comments)    unknown  . Sulphur [Sulfur] Other (See Comments)    unknown  . Telithromycin Other (See Comments)    unknown  . Terfenadine Other (See Comments)    unknown  . Trandolapril-Verapamil Hcl Er Other (See Comments)    Headache, difficulty urinating, high blood sugar, fluid retention  Pt is taking Tarka (trandolapril-verapamil) currently, but requests the medication stay in her allergy list  . Trandolapril-Verapamil Hcl Er Other (See Comments)    Headache, difficulty urinating, high blood sugar, fluid retention  Pt is taking Tarka (trandolapril-verapamil) currently, but requests the medication stay in her allergy list  . Valium [Diazepam] Other (See Comments)    Makes her mean and hyper     Social History:  reports that she has never smoked. She has never used smokeless tobacco. She reports that she does not drink alcohol and does not use drugs.   Family History: Family History  Problem Relation Age of Onset  . Heart disease Mother   . Osteoporosis Mother   . Diabetes Father   . Pancreatic cancer Father   . Colon cancer Other   . Bone cancer Sister   . Prostate cancer Brother   . Colon cancer Brother   . Rectal cancer Sister   . Thyroid disease Sister         benign goiter resected     Physical Exam: Vitals:   03/13/20 1105 03/13/20 1138 03/13/20 1436 03/13/20 1503  BP: 129/71 111/78 (!) 154/75 (!) 159/73  Pulse: (!) 103 (!) 108 100  97  Resp: 17 16 20    Temp: 98 F (36.7 C) 98.3 F (36.8 C) 98.3 F (36.8 C) (!) 97.5 F (36.4 C)  TempSrc: Oral  Oral Oral  SpO2:      Weight:      Height:        Physical Exam Vitals and nursing note reviewed. Exam conducted with a chaperone present.  Constitutional:      Appearance: She is ill-appearing.  HENT:     Head: Normocephalic and atraumatic.     Nose: Nose normal.     Mouth/Throat:     Mouth: Mucous membranes are moist.  Eyes:     Conjunctiva/sclera: Conjunctivae normal.  Cardiovascular:     Rate and Rhythm: Normal rate and regular rhythm.  Pulmonary:     Effort: Pulmonary effort is normal.     Breath sounds: Normal breath sounds.  Abdominal:     General: Bowel sounds are decreased. There is no distension.  Musculoskeletal:        General: No swelling or tenderness.  Skin:    Coloration: Skin is pale. Skin is not jaundiced.  Neurological:     Mental Status: She is alert. Mental status is at baseline.  Psychiatric:        Behavior: Behavior normal.        Thought Content: Thought content normal.      Data reviewed:  I have personally reviewed following labs and imaging studies Labs:  CBC: Recent Labs  Lab 03/07/20 1829 03/08/20 0617 03/09/20 0558 03/12/20 1846 03/13/20 0525  WBC 9.0 7.8 7.9 23.0* 19.1*  NEUTROABS 7.1  --   --  18.9*  --   HGB 9.2* 8.6* 9.0* 9.0* 7.7*  HCT 28.8* 26.9* 28.0* 28.2* 25.2*  MCV 78.3* 78.0* 77.3* 79.7* 82.9  PLT 338 290 338 484* 422*    Basic Metabolic Panel: Recent Labs  Lab 03/09/20 0558 03/09/20 0558 03/11/20 0458 03/11/20 0458 03/12/20 0524 03/12/20 0524 03/12/20 1846 03/13/20 0525  NA 129*  --  130*  --  130*  --  124* 125*  K 3.5   < > 2.9*   < > 4.7   < > 5.5* 5.3*  CL 100  --  99  --  101  --  96* 99  CO2 21*  --   20*  --  19*  --  18* 19*  GLUCOSE 171*  --  108*  --  81  --  253* 202*  BUN 5*  --  <5*  --  6*  --  8 10  CREATININE 0.63  --  0.54  --  0.61  --  0.74 0.89  CALCIUM 7.7*  --  7.5*  --  7.9*  --  7.6* 7.6*  MG  --   --  1.2*  --  2.0  --   --   --    < > = values in this interval not displayed.   GFR Estimated Creatinine Clearance: 44.2 mL/min (by C-G formula based on SCr of 0.89 mg/dL). Liver Function Tests: Recent Labs  Lab 03/12/20 1846  AST 21  ALT 22  ALKPHOS 150*  BILITOT 0.5  PROT 5.7*  ALBUMIN 2.8*   Recent Labs  Lab 03/12/20 1846  LIPASE 28   No results for input(s): AMMONIA in the last 168 hours. Coagulation profile Recent Labs  Lab 03/12/20 2010  INR 1.0    Cardiac Enzymes: No results for input(s): CKTOTAL, CKMB, CKMBINDEX, TROPONINI in the last  168 hours. BNP: Invalid input(s): POCBNP CBG: Recent Labs  Lab 03/12/20 2255 03/13/20 0448 03/13/20 0715 03/13/20 1104 03/13/20 1612  GLUCAP 234* 189* 193* 149* 160*   D-Dimer No results for input(s): DDIMER in the last 72 hours. Hgb A1c No results for input(s): HGBA1C in the last 72 hours. Lipid Profile No results for input(s): CHOL, HDL, LDLCALC, TRIG, CHOLHDL, LDLDIRECT in the last 72 hours. Thyroid function studies No results for input(s): TSH, T4TOTAL, T3FREE, THYROIDAB in the last 72 hours.  Invalid input(s): FREET3 Anemia work up No results for input(s): VITAMINB12, FOLATE, FERRITIN, TIBC, IRON, RETICCTPCT in the last 72 hours. Urinalysis    Component Value Date/Time   COLORURINE YELLOW 03/13/2020 1112   APPEARANCEUR CLEAR 03/13/2020 1112   LABSPEC 1.013 03/13/2020 1112   PHURINE 5.0 03/13/2020 1112   GLUCOSEU 50 (A) 03/13/2020 1112   GLUCOSEU NEGATIVE 12/28/2013 1036   HGBUR NEGATIVE 03/13/2020 1112   HGBUR trace-intact 06/29/2007 1304   BILIRUBINUR NEGATIVE 03/13/2020 1112   BILIRUBINUR negative 01/19/2017 1049   KETONESUR 5 (A) 03/13/2020 1112   PROTEINUR NEGATIVE 03/13/2020  1112   UROBILINOGEN 0.2 01/19/2017 1049   UROBILINOGEN 1.0 03/25/2015 1755   NITRITE NEGATIVE 03/13/2020 1112   LEUKOCYTESUR NEGATIVE 03/13/2020 1112     Microbiology Recent Results (from the past 240 hour(s))  Respiratory Panel by RT PCR (Flu A&B, Covid) - Nasopharyngeal Swab     Status: None   Collection Time: 03/06/20  8:17 AM   Specimen: Nasopharyngeal Swab  Result Value Ref Range Status   SARS Coronavirus 2 by RT PCR NEGATIVE NEGATIVE Final    Comment: (NOTE) SARS-CoV-2 target nucleic acids are NOT DETECTED.  The SARS-CoV-2 RNA is generally detectable in upper respiratoy specimens during the acute phase of infection. The lowest concentration of SARS-CoV-2 viral copies this assay can detect is 131 copies/mL. A negative result does not preclude SARS-Cov-2 infection and should not be used as the sole basis for treatment or other patient management decisions. A negative result Nipp occur with  improper specimen collection/handling, submission of specimen other than nasopharyngeal swab, presence of viral mutation(s) within the areas targeted by this assay, and inadequate number of viral copies (<131 copies/mL). A negative result must be combined with clinical observations, patient history, and epidemiological information. The expected result is Negative.  Fact Sheet for Patients:  PinkCheek.be  Fact Sheet for Healthcare Providers:  GravelBags.it  This test is no t yet approved or cleared by the [place] FDA and  has been authorized for detection and/or diagnosis of SARS-CoV-2 by FDA under an Emergency Use Authorization (EUA). This EUA will remain  in effect (meaning this test can be used) for the duration of the COVID-19 declaration under Section 564(b)(1) of the Act, 21 U.S.C. section 360bbb-3(b)(1), unless the authorization is terminated or revoked sooner.     Influenza A by PCR NEGATIVE NEGATIVE Final    Influenza B by PCR NEGATIVE NEGATIVE Final    Comment: (NOTE) The Xpert Xpress SARS-CoV-2/FLU/RSV assay is intended as an aid in  the diagnosis of influenza from Nasopharyngeal swab specimens and  should not be used as a sole basis for treatment. Nasal washings and  aspirates are unacceptable for Xpert Xpress SARS-CoV-2/FLU/RSV  testing.  Fact Sheet for Patients: PinkCheek.be  Fact Sheet for Healthcare Providers: GravelBags.it  This test is not yet approved or cleared by the [place] FDA and  has been authorized for detection and/or diagnosis of SARS-CoV-2 by  FDA under an Emergency Use Authorization (  EUA). This EUA will remain  in effect (meaning this test can be used) for the duration of the  Covid-19 declaration under Section 564(b)(1) of the Act, 21  U.S.C. section 360bbb-3(b)(1), unless the authorization is  terminated or revoked. Performed at [facility], [place] [address], [place], [place] [address]   Blood Culture (routine x 2)     Status: None (Preliminary result)   Collection Time: 03/12/20  8:00 PM   Specimen: BLOOD  Result Value Ref Range Status   Specimen Description   Final    BLOOD RIGHT ANTECUBITAL Performed at [facility] [address] [place], [place] [address]    Special Requests   Final    BOTTLES DRAWN AEROBIC AND ANAEROBIC Blood Culture adequate volume Performed at [facility] [address] [place], [place] [address]    Culture   Final    NO GROWTH < 12 HOURS Performed at [facility] [address] [place], [place] [address]    Report Status PENDING  Incomplete  Blood Culture (routine x 2)     Status: None (Preliminary result)   Collection Time: 03/12/20  8:10 PM   Specimen: BLOOD RIGHT FOREARM  Result Value Ref Range Status   Specimen Description   Final    BLOOD RIGHT FOREARM Performed at [facility] [address] [place], [place] [address]    Special Requests   Final    BOTTLES DRAWN AEROBIC AND ANAEROBIC Blood Culture adequate volume Performed at [facility] [address] [place], [place] [address]    Culture   Final    NO GROWTH < 12 HOURS Performed at [facility] [address] [place],  [address]    Report Status PENDING  Incomplete  Respiratory Panel by RT PCR (Flu A&B, Covid) - Nasopharyngeal Swab     Status: None   Collection Time: 03/12/20  8:42 PM   Specimen: Nasopharyngeal Swab  Result Value Ref Range Status   SARS Coronavirus 2 by RT PCR NEGATIVE NEGATIVE Final    Comment: (NOTE) SARS-CoV-2 target nucleic acids are NOT DETECTED.  The SARS-CoV-2 RNA is generally detectable in upper respiratoy specimens during the acute phase of infection. The lowest concentration of SARS-CoV-2 viral copies this assay can detect is 131 copies/mL. A negative result does not preclude SARS-Cov-2 infection and should not be used as the sole basis for treatment or other patient management decisions. A negative result [place] occur with  improper specimen collection/handling, submission of specimen other than nasopharyngeal swab, presence of viral mutation(s) within the areas targeted by this assay, and inadequate number of viral copies (<131 copies/mL). A negative result must be combined with clinical observations, patient history, and epidemiological information. The expected result is Negative.  Fact Sheet for Patients:  PinkCheek.be  Fact Sheet for Healthcare Providers:  GravelBags.it  This test is no t yet approved or cleared by the [place] FDA and  has been authorized for detection and/or diagnosis of SARS-CoV-2 by FDA under an Emergency Use Authorization (EUA). This EUA will remain  in effect (meaning this test can be used) for the duration of the COVID-19 declaration  under Section 564(b)(1) of the Act, 21 U.S.C. section 360bbb-3(b)(1), unless the authorization is terminated or revoked sooner.     Influenza A by PCR NEGATIVE NEGATIVE Final   Influenza B by PCR NEGATIVE NEGATIVE Final    Comment: (NOTE) The Xpert Xpress SARS-CoV-2/FLU/RSV assay  is intended as an aid in  the diagnosis of influenza from Nasopharyngeal swab specimens and  should not be used as a sole basis for treatment. Nasal washings and  aspirates are unacceptable for Xpert Xpress SARS-CoV-2/FLU/RSV  testing.  Fact Sheet for Patients: PinkCheek.be  Fact Sheet for Healthcare Providers: GravelBags.it  This test is not yet approved or cleared by the [place] FDA and  has been authorized for detection and/or diagnosis of SARS-CoV-2 by  FDA under an Emergency Use Authorization (EUA). This EUA will remain  in effect (meaning this test can be used) for the duration of the  Covid-19 declaration under Section 564(b)(1) of the Act, 21  U.S.C. section 360bbb-3(b)(1), unless the authorization is  terminated or revoked. Performed at [facility], [place] [address], [place], [place] [address]        Inpatient Medications:   Scheduled Meds: . [START ON 03/14/2020] Chlorhexidine Gluconate Cloth  6 each Topical Daily  . influenza vaccine adjuvanted  0.5 mL Intramuscular Tomorrow-1000  . insulin aspart  0-5 Units Subcutaneous QHS  . insulin aspart  0-9 Units Subcutaneous TID WC  . levothyroxine  75 mcg Oral QAC breakfast  . phenytoin  100 mg Oral Daily   And  . phenytoin  50 mg Oral Daily  . phenytoin  200 mg Oral QHS  . primidone  150 mg Oral Daily   And  . primidone  100 mg Oral QHS  . vitamin B-1  250 mg Oral QHS  . trandolapril  2 mg Oral QHS   And  . verapamil  240 mg Oral QHS   Continuous Infusions: . sodium chloride Stopped (03/13/20 1113)  . meropenem (MERREM) IV       Radiological Exams  on Admission: CT ABDOMEN PELVIS WO CONTRAST  Result Date: 03/13/2020 CLINICAL DATA:  Acute abdominal pain, recent findings of hemorrhage on CT examination EXAM: CT ABDOMEN AND PELVIS WITHOUT CONTRAST TECHNIQUE: Multidetector CT imaging of the abdomen and pelvis was performed following the standard protocol without IV contrast. COMPARISON:  03/12/2020 FINDINGS: Lower chest: Lung bases demonstrate mild bibasilar atelectasis. Cardiac blood pool shows decreased attenuation consistent with underlying anemia. Hepatobiliary: Gallbladder has been surgically removed. Liver is within normal limits. Pancreas: Stable cystic lesion is noted body/tail of the pancreas similar to that seen on the previous exam. A few scattered calcifications are noted likely related to chronic pancreatitis. Spleen: Normal in size without focal abnormality. Adrenals/Urinary Tract: Kidneys are well visualize without renal calculi or obstructive changes. The bladder is decompressed by Foley catheter. Stomach/Bowel: There are changes consistent with the recent right hemicolectomy in the right mid abdomen. The anastomosis appears patent. No areas of focal fluid collection or air collection are noted adjacent to the anastomotic site to suggest perforation or breakdown. Contrast material has not reached the level of the anastomosis however. The more distal colon is within normal limits. Small bowel demonstrates some mild dilatation increased from the previous day likely related to ileus. Vascular/Lymphatic: Aortic atherosclerosis. No enlarged abdominal or pelvic lymph nodes. Reproductive: Status post hysterectomy. No adnexal masses. Other: No herniation is identified. Postsurgical changes are noted in the anterior abdominal wall consistent with the given clinical history. Free fluid is noted slightly increased when compared with the prior exam and the area of hyperdense material in the right lower quadrant also appears mildly increased consistent with  local hemorrhage. This is consistent with the patient's recent drop in hemoglobin. Musculoskeletal: No acute or significant osseous findings. Changes of  prior vertebral augmentation are noted at T12. Chronic T8 compression deformity is noted as well. IMPRESSION: Persistent and slightly increased hyperdense material in the right lower quadrant consistent with the known hemorrhage. This corresponds with the patient's hemoglobin drop and corresponds with the overall anemia in the cardiac blood pool. Changes consistent with right hemicolectomy without evidence of perforation or focal pericolonic fluid collection. Mild dilatation of the small bowel related to ileus as well as some localized compression of the more distal small bowel by the focal right lower quadrant hematoma. These results were called by telephone at the time of interpretation on 03/13/2020 to provider CHRISTOPHER WHITE , who verbally acknowledged these results. Electronically Signed   By: Inez Catalina M.D.   On: 03/13/2020 13:07   CT ABDOMEN PELVIS WO CONTRAST  Result Date: 03/12/2020 CLINICAL DATA:  Acute generalized abdominal pain. EXAM: CT ABDOMEN AND PELVIS WITHOUT CONTRAST TECHNIQUE: Multidetector CT imaging of the abdomen and pelvis was performed following the standard protocol without IV contrast. COMPARISON:  November 22, 2019. FINDINGS: Lower chest: No acute abnormality. Hepatobiliary: No focal liver abnormality is seen. Status post cholecystectomy. No biliary dilatation. Pancreas: Grossly stable 15 mm cystic abnormality is seen in pancreatic tail. No acute inflammation is noted. No ductal dilatation is noted. Spleen: Fluid is noted around the spleen. Adrenals/Urinary Tract: Adrenal glands are unremarkable. Kidneys are normal, without renal calculi, focal lesion, or hydronephrosis. Bladder is unremarkable. Stomach/Bowel: The stomach appears normal. Status post right hemicolectomy. Mildly dilated small bowel loops are noted proximally which most  likely represent ileus. There does appear to be a moderate amount of high density material in the right lower quadrant and posterior pelvis consistent with hemorrhage. Vascular/Lymphatic: Aortic atherosclerosis. No enlarged abdominal or pelvic lymph nodes. Reproductive: Status post hysterectomy. No adnexal masses. Other: No hernia is noted. Musculoskeletal: No acute or significant osseous findings. IMPRESSION: 1. Moderate amount of high density material is noted in the right lower quadrant and posterior pelvis consistent with hemorrhage. Critical Value/emergent results were called by telephone at the time of interpretation on 03/12/2020 at 8:12 pm to provider [facility] PATEL , who verbally acknowledged these results. 2. Mildly dilated small bowel loops are noted proximally which most likely represent ileus. 3. Grossly stable 15 mm cystic abnormality is seen in pancreatic tail. Follow-up with MRI in 1 year is recommended to ensure stability. 4. Aortic atherosclerosis. Aortic Atherosclerosis (ICD10-I70.0). Electronically Signed   By: Marijo Conception M.D.   On: 03/12/2020 20:12   DG Chest Port 1 View  Result Date: 03/12/2020 CLINICAL DATA:  Questionable sepsis, evaluate for abnormality, surgery 03/06/2020 for colon cancer EXAM: PORTABLE CHEST 1 VIEW COMPARISON:  CT 02/17/2020, radiograph 02/17/2020 FINDINGS: No consolidation, features of edema, pneumothorax, or effusion. Pulmonary vascularity is normally distributed. The aorta is calcified. The remaining cardiomediastinal contours are unremarkable. No acute osseous or soft tissue abnormality. Telemetry leads overlie the chest. IMPRESSION: No acute cardiopulmonary abnormality. Specifically, no focal consolidative opacity to suggest pneumonia. Electronically Signed   By: Lovena Le M.D.   On: 03/12/2020 19:59    Impression/Recommendations Active Problems:   Nausea & vomiting  1. Colon cancer s/p right hemicolectomy on 03/06/2020 with Dr. Dema Severin 1. Management per  primary team  2. Type 2 diabetes 1. Currently n.p.o. except for sips with meds 2. Continue on Lantus 10 units daily and start sliding scale when tolerating p.o. intake  3. Acute on chronic anemia 1. Follow-up Hb after getting her blood transfusion  4. Acute on chronic hyponatremia  1. Baseline seems to be high 120s 2. Getting IV fluids 3. Check sodium studies  5. Hypothyroidism 1. Continue Synthroid  6. History of brain tumor s/p surgery with distant history of seizures 1. Continue home meds  7. Hypertension 1. On verapamil and trandolapril   Thank you for this consultation.  Our [facility] hospitalist team will follow the patient with you.   Time Spent: 32 mins  Harold Hedge M.D. [facility] 03/13/2020, 5:32 PM

## 2020-03-14 DIAGNOSIS — R112 Nausea with vomiting, unspecified: Secondary | ICD-10-CM | POA: Diagnosis not present

## 2020-03-14 LAB — GLUCOSE, CAPILLARY
Glucose-Capillary: 133 mg/dL — ABNORMAL HIGH (ref 70–99)
Glucose-Capillary: 133 mg/dL — ABNORMAL HIGH (ref 70–99)
Glucose-Capillary: 140 mg/dL — ABNORMAL HIGH (ref 70–99)
Glucose-Capillary: 151 mg/dL — ABNORMAL HIGH (ref 70–99)
Glucose-Capillary: 151 mg/dL — ABNORMAL HIGH (ref 70–99)

## 2020-03-14 LAB — CBC WITH DIFFERENTIAL/PLATELET
Abs Immature Granulocytes: 0.17 10*3/uL — ABNORMAL HIGH (ref 0.00–0.07)
Basophils Absolute: 0.1 10*3/uL (ref 0.0–0.1)
Basophils Relative: 1 %
Eosinophils Absolute: 0.4 10*3/uL (ref 0.0–0.5)
Eosinophils Relative: 3 %
HCT: 31.7 % — ABNORMAL LOW (ref 36.0–46.0)
Hemoglobin: 10.7 g/dL — ABNORMAL LOW (ref 12.0–15.0)
Immature Granulocytes: 1 %
Lymphocytes Relative: 10 %
Lymphs Abs: 1.2 10*3/uL (ref 0.7–4.0)
MCH: 28.5 pg (ref 26.0–34.0)
MCHC: 33.8 g/dL (ref 30.0–36.0)
MCV: 84.5 fL (ref 80.0–100.0)
Monocytes Absolute: 1.2 10*3/uL — ABNORMAL HIGH (ref 0.1–1.0)
Monocytes Relative: 10 %
Neutro Abs: 9 10*3/uL — ABNORMAL HIGH (ref 1.7–7.7)
Neutrophils Relative %: 75 %
Platelets: 331 10*3/uL (ref 150–400)
RBC: 3.75 MIL/uL — ABNORMAL LOW (ref 3.87–5.11)
RDW: 20 % — ABNORMAL HIGH (ref 11.5–15.5)
WBC: 12.1 10*3/uL — ABNORMAL HIGH (ref 4.0–10.5)
nRBC: 0 % (ref 0.0–0.2)

## 2020-03-14 LAB — BPAM RBC
Blood Product Expiration Date: 202111292359
Blood Product Expiration Date: 202111292359
ISSUE DATE / TIME: 202111101116
ISSUE DATE / TIME: 202111101442
Unit Type and Rh: 6200
Unit Type and Rh: 6200

## 2020-03-14 LAB — URINE CULTURE: Culture: NO GROWTH

## 2020-03-14 LAB — TYPE AND SCREEN
ABO/RH(D): A POS
Antibody Screen: NEGATIVE
Unit division: 0
Unit division: 0

## 2020-03-14 LAB — OSMOLALITY: Osmolality: 276 mOsm/kg (ref 275–295)

## 2020-03-14 LAB — HEMOGLOBIN AND HEMATOCRIT, BLOOD
HCT: 29.7 % — ABNORMAL LOW (ref 36.0–46.0)
Hemoglobin: 9.9 g/dL — ABNORMAL LOW (ref 12.0–15.0)

## 2020-03-14 LAB — OSMOLALITY, URINE: Osmolality, Ur: 357 mOsm/kg (ref 300–900)

## 2020-03-14 MED ORDER — FLUCONAZOLE 150 MG PO TABS
150.0000 mg | ORAL_TABLET | Freq: Once | ORAL | Status: DC
  Filled 2020-03-14: qty 1

## 2020-03-14 NOTE — Progress Notes (Signed)
Pt's diet changed to Carb. Modified Pt attempted soup and became nauseated with small amount of emesis. Medicated for N/V.

## 2020-03-14 NOTE — Progress Notes (Signed)
PROGRESS NOTE  [name] IRJ:[id number] DOB: June 07, 1929 DOA: 03/12/2020 PCP: Yolanda Bill, MD  HPI/Recap of past 24 hours: [name] is an 86 y.o. female with a past medical history of chronic hyponatremia, type 2 diabetes on insulin, hypothyroidism, hypertension, hyperlipidemia, left tib/fib fracture, breast cancer, brain tumor s/p surgery complicated with seizures, colon cancer followed by neurology outpatient, recent hospitalization from 11/3-11/9 for laparoscopic right hemicolectomy with extensive adhesiolysis by Dr. Dema [name] on 11/3 for colon cancer complicated by postop bleeding requiring blood transfusions who was discharged yesterday but had persistent generalized weakness, nausea and abdominal pain causing her to return to the ED soon after discharge.  She was seen by Dr. Redmond [name], general surgery, in the ED after she was found to have a SIRS type picture in the ED.  CT imaging was performed which showed mild small bowel dilatation and fluid within the abdomen consistent with blood however her Hb was stable from discharge but now with leukocytosis.  She was given fluids and started on empiric antibiotics.  CXR was unremarkable and initial lactate was elevated but repeat was normal.  Given her multiple medical comorbidities, [facility] was consulted.  03/14/20: Main complaint today, she believes she has an vagina yeast infection. 1 dose of oral fluconazole ordered. She states she had a good bowel movement today.  Assessment/Plan: Active Problems:   Nausea & vomiting  1. Colon cancer s/p right hemicolectomy on 03/06/2020 with Dr. Dema [name] 1. Management per primary team  2. Type 2 diabetes 1. Currently n.p.o. except for sips with meds 2. States she had a bowel movement today, bowel sounds present on exam. 3. Continue on Lantus 10 units daily and start sliding scale when tolerating p.o. intake  3. Acute on chronic anemia 1. Hemoglobin stable 10.7 on 03/14/2020  Acute on chronic  hyponatremia 1. Baseline seems to be high 120s 2. Getting IV fluids 3. Repeat renal panel in the morning  Hypothyroidism 1. Continue Synthroid  History of brain tumor s/p surgery with distant history of seizures 1. Continue home meds  Hypertension BP not at goal, elevated BP Morriss be contributed by pain. On verapamil and trandolapril Continue to monitor vital signs, if persistently elevated [place] consider increasing doses of current antihypertensives.  Possible vagina candidiasis  Per the patient she feels like she has a vagina yeast infection. 1 dose of oral fluconazole on 03/14/2020.   Thank you for this consultation.  Our [facility] hospitalist team will follow the patient with you.     Objective: Vitals:   03/13/20 2003 03/14/20 0651 03/14/20 1010 03/14/20 1300  BP:  (!) 154/73 (!) 150/73 (!) 162/78  Pulse:  89 91 88  Resp:  18 18 18   Temp:  98.2 F (36.8 C) 99 F (37.2 C) 99.3 F (37.4 C)  TempSrc: Oral  Oral Oral  SpO2: 98% 96%  97%  Weight:      Height:        Intake/Output Summary (Last 24 hours) at 03/14/2020 1541 Last data filed at 03/14/2020 1300 Gross per 24 hour  Intake 2332.37 ml  Output 800 ml  Net 1532.37 ml   Filed Weights   03/12/20 2245  Weight: 65 kg    Exam:  . General: 86 y.o. year-old female well developed well nourished in no acute distress.  Alert and oriented x3. . Cardiovascular: Regular rate and rhythm with no rubs or gallops.  No thyromegaly or JVD noted.   Marland Kitchen Respiratory: Clear to auscultation with no wheezes or  rales. Good inspiratory effort. . Abdomen: Soft tender with palpation diffusely. Nondistended. Mild bowel sounds present.  . Musculoskeletal: No lower extremity edema. 2/4 pulses in all 4 extremities. Marland Kitchen Psychiatry: Mood is appropriate for condition and setting   Data Reviewed: CBC: Recent Labs  Lab 03/07/20 1829 03/07/20 1829 03/08/20 0617 03/08/20 0617 03/09/20 0558 03/09/20 0558 03/12/20 1846 03/13/20 0525  03/13/20 2050 03/14/20 0239 03/14/20 0717  WBC 9.0   < > 7.8  --  7.9  --  23.0* 19.1*  --   --  12.1*  NEUTROABS 7.1  --   --   --   --   --  18.9*  --   --   --  9.0*  HGB 9.2*   < > 8.6*   < > 9.0*   < > 9.0* 7.7* 10.8* 9.9* 10.7*  HCT 28.8*   < > 26.9*   < > 28.0*   < > 28.2* 25.2* 32.0* 29.7* 31.7*  MCV 78.3*   < > 78.0*  --  77.3*  --  79.7* 82.9  --   --  84.5  PLT 338   < > 290  --  338  --  484* 422*  --   --  331   < > = values in this interval not displayed.   Basic Metabolic Panel: Recent Labs  Lab 03/11/20 0458 03/12/20 0524 03/12/20 1846 03/13/20 0525 03/13/20 2050  NA 130* 130* 124* 125* 129*  K 2.9* 4.7 5.5* 5.3* 5.9*  CL 99 101 96* 99 101  CO2 20* 19* 18* 19* 19*  GLUCOSE 108* 81 253* 202* 137*  BUN <5* 6* 8 10 12   CREATININE 0.54 0.61 0.74 0.89 1.22*  CALCIUM 7.5* 7.9* 7.6* 7.6* 7.8*  MG 1.2* 2.0  --   --   --    GFR: Estimated Creatinine Clearance: 32.2 mL/min (A) (by C-G formula based on SCr of 1.22 mg/dL (H)). Liver Function Tests: Recent Labs  Lab 03/12/20 1846  AST 21  ALT 22  ALKPHOS 150*  BILITOT 0.5  PROT 5.7*  ALBUMIN 2.8*   Recent Labs  Lab 03/12/20 1846  LIPASE 28   No results for input(s): AMMONIA in the last 168 hours. Coagulation Profile: Recent Labs  Lab 03/12/20 2010  INR 1.0   Cardiac Enzymes: No results for input(s): CKTOTAL, CKMB, CKMBINDEX, TROPONINI in the last 168 hours. BNP (last 3 results) No results for input(s): PROBNP in the last 8760 hours. HbA1C: No results for input(s): HGBA1C in the last 72 hours. CBG: Recent Labs  Lab 03/13/20 1612 03/13/20 2001 03/14/20 0010 03/14/20 0737 03/14/20 1146  GLUCAP 160* 127* 151* 133* 133*   Lipid Profile: No results for input(s): CHOL, HDL, LDLCALC, TRIG, CHOLHDL, LDLDIRECT in the last 72 hours. Thyroid Function Tests: No results for input(s): TSH, T4TOTAL, FREET4, T3FREE, THYROIDAB in the last 72 hours. Anemia Panel: No results for input(s): VITAMINB12, FOLATE,  FERRITIN, TIBC, IRON, RETICCTPCT in the last 72 hours. Urine analysis:    Component Value Date/Time   COLORURINE YELLOW 03/13/2020 [place] 03/13/2020 1112   LABSPEC 1.013 03/13/2020 1112   PHURINE 5.0 03/13/2020 1112   GLUCOSEU 50 (A) 03/13/2020 1112   GLUCOSEU NEGATIVE 12/28/2013 1036   HGBUR NEGATIVE 03/13/2020 1112   HGBUR trace-intact 06/29/2007 1304   BILIRUBINUR NEGATIVE 03/13/2020 1112   BILIRUBINUR negative 01/19/2017 1049   KETONESUR 5 (A) 03/13/2020 1112   PROTEINUR NEGATIVE 03/13/2020 1112   UROBILINOGEN 0.2 01/19/2017 1049  UROBILINOGEN 1.0 03/25/2015 1755   NITRITE NEGATIVE 03/13/2020 1112   LEUKOCYTESUR NEGATIVE 03/13/2020 1112   Sepsis Labs: @LABRCNTIP (procalcitonin:4,lacticidven:4)  ) Recent Results (from the past 240 hour(s))  Respiratory Panel by RT PCR (Flu A&B, Covid) - Nasopharyngeal Swab     Status: None   Collection Time: 03/06/20  8:17 AM   Specimen: Nasopharyngeal Swab  Result Value Ref Range Status   SARS Coronavirus 2 by RT PCR NEGATIVE NEGATIVE Final    Comment: (NOTE) SARS-CoV-2 target nucleic acids are NOT DETECTED.  The SARS-CoV-2 RNA is generally detectable in upper respiratoy specimens during the acute phase of infection. The lowest concentration of SARS-CoV-2 viral copies this assay can detect is 131 copies/mL. A negative result does not preclude SARS-Cov-2 infection and should not be used as the sole basis for treatment or other patient management decisions. A negative result Mizrahi occur with  improper specimen collection/handling, submission of specimen other than nasopharyngeal swab, presence of viral mutation(s) within the areas targeted by this assay, and inadequate number of viral copies (<131 copies/mL). A negative result must be combined with clinical observations, patient history, and epidemiological information. The expected result is Negative.  Fact Sheet for Patients:   PinkCheek.be  Fact Sheet for Healthcare Providers:  GravelBags.it  This test is no t yet approved or cleared by the [place] FDA and  has been authorized for detection and/or diagnosis of SARS-CoV-2 by FDA under an Emergency Use Authorization (EUA). This EUA will remain  in effect (meaning this test can be used) for the duration of the COVID-19 declaration under Section 564(b)(1) of the Act, 21 U.S.C. section 360bbb-3(b)(1), unless the authorization is terminated or revoked sooner.     Influenza A by PCR NEGATIVE NEGATIVE Final   Influenza B by PCR NEGATIVE NEGATIVE Final    Comment: (NOTE) The Xpert Xpress SARS-CoV-2/FLU/RSV assay is intended as an aid in  the diagnosis of influenza from Nasopharyngeal swab specimens and  should not be used as a sole basis for treatment. Nasal washings and  aspirates are unacceptable for Xpert Xpress SARS-CoV-2/FLU/RSV  testing.  Fact Sheet for Patients: PinkCheek.be  Fact Sheet for Healthcare Providers: GravelBags.it  This test is not yet approved or cleared by the [place] FDA and  has been authorized for detection and/or diagnosis of SARS-CoV-2 by  FDA under an Emergency Use Authorization (EUA). This EUA will remain  in effect (meaning this test can be used) for the duration of the  Covid-19 declaration under Section 564(b)(1) of the Act, 21  U.S.C. section 360bbb-3(b)(1), unless the authorization is  terminated or revoked. Performed at [facility], [place] [address] [place], [place] [address]   Blood Culture (routine x 2)     Status: None (Preliminary result)   Collection Time: 03/12/20  8:00 PM   Specimen: BLOOD  Result Value Ref Range Status   Specimen Description   Final    BLOOD RIGHT ANTECUBITAL Performed at [facility] [address] [place], [place] [address]     Special Requests   Final    BOTTLES DRAWN AEROBIC AND ANAEROBIC Blood Culture adequate volume Performed at [facility] [address] [place], [place] [address]    Culture   Final    NO GROWTH 2 DAYS Performed at [facility] [address], [place], [place] [address]    Report Status PENDING  Incomplete  Blood Culture (routine x 2)     Status: None (Preliminary result)  Collection Time: 03/12/20  8:10 PM   Specimen: BLOOD RIGHT FOREARM  Result Value Ref Range Status   Specimen Description   Final    BLOOD RIGHT FOREARM Performed at [facility] [address], [place], [place] [address]    Special Requests   Final    BOTTLES DRAWN AEROBIC AND ANAEROBIC Blood Culture adequate volume Performed at [facility] [address] [place], [place] [address]    Culture   Final    NO GROWTH 2 DAYS Performed at [facility] [address], [place], [place] [address]    Report Status PENDING  Incomplete  Respiratory Panel by RT PCR (Flu A&B, Covid) - Nasopharyngeal Swab     Status: None   Collection Time: 03/12/20  8:42 PM   Specimen: Nasopharyngeal Swab  Result Value Ref Range Status   SARS Coronavirus 2 by RT PCR NEGATIVE NEGATIVE Final    Comment: (NOTE) SARS-CoV-2 target nucleic acids are NOT DETECTED.  The SARS-CoV-2 RNA is generally detectable in upper respiratoy specimens during the acute phase of infection. The lowest concentration of SARS-CoV-2 viral copies this assay can detect is 131 copies/mL. A negative result does not preclude SARS-Cov-2 infection and should not be used as the sole basis for treatment or other patient management decisions. A negative result Berres occur with  improper specimen collection/handling, submission of specimen other than nasopharyngeal swab, presence of viral mutation(s) within the areas targeted by this assay, and inadequate number of viral copies (<131 copies/mL). A  negative result must be combined with clinical observations, patient history, and epidemiological information. The expected result is Negative.  Fact Sheet for Patients:  PinkCheek.be  Fact Sheet for Healthcare Providers:  GravelBags.it  This test is no t yet approved or cleared by the [place] FDA and  has been authorized for detection and/or diagnosis of SARS-CoV-2 by FDA under an Emergency Use Authorization (EUA). This EUA will remain  in effect (meaning this test can be used) for the duration of the COVID-19 declaration under Section 564(b)(1) of the Act, 21 U.S.C. section 360bbb-3(b)(1), unless the authorization is terminated or revoked sooner.     Influenza A by PCR NEGATIVE NEGATIVE Final   Influenza B by PCR NEGATIVE NEGATIVE Final    Comment: (NOTE) The Xpert Xpress SARS-CoV-2/FLU/RSV assay is intended as an aid in  the diagnosis of influenza from Nasopharyngeal swab specimens and  should not be used as a sole basis for treatment. Nasal washings and  aspirates are unacceptable for Xpert Xpress SARS-CoV-2/FLU/RSV  testing.  Fact Sheet for Patients: PinkCheek.be  Fact Sheet for Healthcare Providers: GravelBags.it  This test is not yet approved or cleared by the [place] FDA and  has been authorized for detection and/or diagnosis of SARS-CoV-2 by  FDA under an Emergency Use Authorization (EUA). This EUA will remain  in effect (meaning this test can be used) for the duration of the  Covid-19 declaration under Section 564(b)(1) of the Act, 21  U.S.C. section 360bbb-3(b)(1), unless the authorization is  terminated or revoked. Performed at [facility], [place] [address], [place], [place] [address]   Urine culture     Status: None   Collection Time: 03/13/20 11:12 AM   Specimen: In/Out Cath Urine  Result Value Ref Range Status    Specimen Description   Final    IN/OUT CATH URINE Performed at [facility] [address], [place], [place] [address]    Special  Requests   Final    NONE Performed at [facility], [place] [address], [place], [place] [address]    Culture   Final    NO GROWTH Performed at [facility], [place] [address], [place], [place] [address]    Report Status 03/14/2020 FINAL  Final      Studies: No results found.  Scheduled Meds: . Chlorhexidine Gluconate Cloth  6 each Topical Daily  . fluconazole  150 mg Oral Once  . insulin glargine  10 Units Subcutaneous QHS  . levothyroxine  75 mcg Oral QAC breakfast  . phenytoin  100 mg Oral Daily   And  . phenytoin  50 mg Oral Daily  . phenytoin  200 mg Oral QHS  . primidone  150 mg Oral Daily   And  . primidone  100 mg Oral QHS  . vitamin B-1  250 mg Oral QHS  . trandolapril  2 mg Oral QHS   And  . verapamil  240 mg Oral QHS    Continuous Infusions: . sodium chloride 100 mL/hr at 03/14/20 1216  . meropenem (MERREM) IV 1 g (03/14/20 0948)     LOS: 2 days     Kayleen Memos, MD [facility] Pager [phone]  If 7PM-7AM, please contact night-coverage [email or web address] Password [facility] 03/14/2020, 3:41 PM

## 2020-03-14 NOTE — Progress Notes (Signed)
Subjective Feeling better today - still weak, no emesis. Had mild nausea. Passing flatus and having BMs  Objective: Vital signs in last 24 hours: Temp:  [97.5 F (36.4 C)-99 F (37.2 C)] 99 F (37.2 C) (11/11 1010) Pulse Rate:  [89-108] 91 (11/11 1010) Resp:  [16-20] 18 (11/11 1010) BP: (111-159)/(61-78) 150/73 (11/11 1010) SpO2:  [96 %-98 %] 96 % (11/11 0651) Last BM Date: 03/14/20  Intake/Output from previous day: 11/10 0701 - 11/11 0700 In: 2576.4 [I.V.:1742.4; Blood:734; IV Piggyback:100] Out: 600 [Urine:600] Intake/Output this shift: No intake/output data recorded.  Gen: NAD, comfortable, mentation appropriate - A&O x3 CV: RRR Pulm: Normal work of breathing Abd: Soft, mild/mod tenderness in RLQ; no left sided tenderness; no rebound nor guarding. Incisions c/d/i without erythema or drainage. Ext: SCDs in place  Lab Results: CBC  Recent Labs    03/13/20 0525 03/13/20 2050 03/14/20 0239 03/14/20 0717  WBC 19.1*  --   --  12.1*  HGB 7.7*   < > 9.9* 10.7*  HCT 25.2*   < > 29.7* 31.7*  PLT 422*  --   --  331   < > = values in this interval not displayed.   BMET Recent Labs    03/13/20 0525 03/13/20 2050  NA 125* 129*  K 5.3* 5.9*  CL 99 101  CO2 19* 19*  GLUCOSE 202* 137*  BUN 10 12  CREATININE 0.89 1.22*  CALCIUM 7.6* 7.8*   PT/INR Recent Labs    03/12/20 2010  LABPROT 12.6  INR 1.0   ABG No results for input(s): PHART, HCO3 in the last 72 hours.  Invalid input(s): PCO2, PO2  Studies/Results:  Anti-infectives: Anti-infectives (From admission, onward)   Start     Dose/Rate Route Frequency Ordered Stop   03/13/20 2200  meropenem (MERREM) 1 g in sodium chloride 0.9 % 100 mL IVPB        1 g 200 mL/hr over 30 Minutes Intravenous Every 12 hours 03/13/20 1015     03/13/20 0915  meropenem (MERREM) 1 g in sodium chloride 0.9 % 100 mL IVPB  Status:  Discontinued        1 g 200 mL/hr over 30 Minutes Intravenous Every 8 hours 03/13/20 0854 03/13/20  1015   03/12/20 2200  meropenem (MERREM) 1 g in sodium chloride 0.9 % 100 mL IVPB        1 g 200 mL/hr over 30 Minutes Intravenous  Once 03/12/20 2150 03/12/20 2355   03/12/20 1945  metroNIDAZOLE (FLAGYL) IVPB 500 mg        500 mg 100 mL/hr over 60 Minutes Intravenous  Once 03/12/20 1936 03/12/20 2133   03/12/20 1945  vancomycin (VANCOCIN) IVPB 1000 mg/200 mL premix        1,000 mg 200 mL/hr over 60 Minutes Intravenous  Once 03/12/20 1936 03/12/20 2133       Assessment/Plan: Patient Active Problem List   Diagnosis Date Noted  . Nausea & vomiting 03/12/2020  . Cancer of ascending colon pT3pN0 (0/12 LN) s/p lap right colectomy 03/06/2020 03/09/2020  . History of multiple allergies 03/09/2020  . S/P right hemicolectomy 03/06/2020  . Closed fracture of shaft of tibia 02/12/2020  . Gait abnormality 12/04/2019  . Impaired left ventricular function 09/05/2019  . Hypokalemia   . Effusion of left knee joint   . Hyponatremia   . Left patella fracture 10/26/2018  . Fracture of patella 10/26/2018  . Acquired hypothyroidism 08/25/2017  . H/O excision of tumor of brain  meninges 08/25/2017  . Hammer toe 08/25/2017  . History of breast cancer 08/25/2017  . Nontoxic thyroid nodule 08/25/2017  . Osteopetrosis 08/25/2017  . Dysuria 01/19/2017  . Vitamin D deficiency 11/18/2016  . Tremor, essential 08/18/2016  . Convulsions/seizures ([place]) 10/16/2014  . Brain tumor (benign) ([place]) 10/18/2013  . Subdural hemorrhage ([place]) 10/09/2013  . Anxiety 10/09/2013  . Compression fracture of T12 vertebra ([place]) 10/09/2013  . Nontoxic multinodular goiter 09/05/2013  . Syncope 08/27/2013  . Carotid artery disease ([place]) 08/27/2013  . Abnormal thyroid ultrasound 08/27/2013  . Cancer of central portion of female breast ([place]) 12/30/2012  . Osteoporosis 03/06/2010  . ASTHMA 03/05/2009  . Asthma 03/05/2009  . IRRITABLE BOWEL SYNDROME  diarrhea type 03/21/2008  . ANEMIA-NOS 12/16/2006  . GERD 12/16/2006  .  Insulin-requiring or dependent type II diabetes mellitus ([place]) 10/29/2006  . Mixed hyperlipidemia 10/29/2006  . Essential hypertension 10/29/2006  . Allergic rhinitis, cause unspecified 10/29/2006  . OSTEOARTHRITIS 10/29/2006   -Hgb responded nicely to 2U for acute blood loss anemia -Reorder diet - carb modified - discussed trying the liquid offerings first before considering more solid items. Clear liquids have given a lot of issues with hyperglycemia for her -Urinary retention - will void trial tomorrow -Empiric IV abx - [place] D/C tomorrow if WBC has normalized -PPx: SCDs; holding chemical dvt ppx in light of above -Her sister Tonia Ghent is at bedside; reviewed the above with her as well -Dispo: requesting [facility] place if able   LOS: 2 days   Sharon Mt. Dema Severin, M.D. [facility] Use [email or web address] to contact on call provider

## 2020-03-14 NOTE — Progress Notes (Signed)
PT Cancellation Note  Patient Details Name: [name] MRN: [id number] DOB: [birth year]   Cancelled Treatment:    Reason Eval/Treat Not Completed: Fatigue/lethargy limiting ability to participate Pt received pain meds and sleeping heavily.   Jaylon Boylen,KATHrine E 03/14/2020, 10:19 AM Arlyce Dice, DPT Acute Rehabilitation Services Pager: [phone] Office: [phone]

## 2020-03-14 NOTE — Progress Notes (Signed)
Pt resting quietly after Medications given MD made aware and new orders given to make Pt NPO. Maintain current plan of care for Pt and monitor closely

## 2020-03-15 DIAGNOSIS — R112 Nausea with vomiting, unspecified: Secondary | ICD-10-CM | POA: Diagnosis not present

## 2020-03-15 LAB — CBC WITH DIFFERENTIAL/PLATELET
Abs Immature Granulocytes: 0.08 10*3/uL — ABNORMAL HIGH (ref 0.00–0.07)
Basophils Absolute: 0 10*3/uL (ref 0.0–0.1)
Basophils Relative: 1 %
Eosinophils Absolute: 0.3 10*3/uL (ref 0.0–0.5)
Eosinophils Relative: 4 %
HCT: 32.1 % — ABNORMAL LOW (ref 36.0–46.0)
Hemoglobin: 10.4 g/dL — ABNORMAL LOW (ref 12.0–15.0)
Immature Granulocytes: 1 %
Lymphocytes Relative: 16 %
Lymphs Abs: 1.1 10*3/uL (ref 0.7–4.0)
MCH: 27.7 pg (ref 26.0–34.0)
MCHC: 32.4 g/dL (ref 30.0–36.0)
MCV: 85.4 fL (ref 80.0–100.0)
Monocytes Absolute: 0.6 10*3/uL (ref 0.1–1.0)
Monocytes Relative: 9 %
Neutro Abs: 4.8 10*3/uL (ref 1.7–7.7)
Neutrophils Relative %: 69 %
Platelets: 322 10*3/uL (ref 150–400)
RBC: 3.76 MIL/uL — ABNORMAL LOW (ref 3.87–5.11)
RDW: 20.5 % — ABNORMAL HIGH (ref 11.5–15.5)
WBC: 7 10*3/uL (ref 4.0–10.5)
nRBC: 0 % (ref 0.0–0.2)

## 2020-03-15 LAB — BASIC METABOLIC PANEL
Anion gap: 6 (ref 5–15)
BUN: 9 mg/dL (ref 8–23)
CO2: 21 mmol/L — ABNORMAL LOW (ref 22–32)
Calcium: 7.4 mg/dL — ABNORMAL LOW (ref 8.9–10.3)
Chloride: 104 mmol/L (ref 98–111)
Creatinine, Ser: 0.95 mg/dL (ref 0.44–1.00)
GFR, Estimated: 58 mL/min — ABNORMAL LOW (ref >60)
Glucose, Bld: 85 mg/dL (ref 70–99)
Potassium: 4.4 mmol/L (ref 3.5–5.1)
Sodium: 131 mmol/L — ABNORMAL LOW (ref 135–145)

## 2020-03-15 LAB — GLUCOSE, CAPILLARY
Glucose-Capillary: 74 mg/dL (ref 70–99)
Glucose-Capillary: 70 mg/dL (ref 70–99)
Glucose-Capillary: 85 mg/dL (ref 70–99)
Glucose-Capillary: 102 mg/dL — ABNORMAL HIGH (ref 70–99)

## 2020-03-15 MED ORDER — METOCLOPRAMIDE HCL 10 MG/10ML PO SOLN
5.0000 mg | Freq: Three times a day (TID) | ORAL | Status: DC
  Administered 2020-03-17 – 2020-03-20 (×2): 5 mg via ORAL
  Filled 2020-03-15 (×6): qty 10
  Filled 2020-03-15: qty 5
  Filled 2020-03-15 (×8): qty 10

## 2020-03-15 MED ORDER — ACETAMINOPHEN 325 MG PO TABS
650.0000 mg | ORAL_TABLET | Freq: Four times a day (QID) | ORAL | Status: DC
  Administered 2020-03-15 – 2020-03-21 (×6): 650 mg via ORAL
  Filled 2020-03-15 (×10): qty 2

## 2020-03-15 MED ORDER — IBUPROFEN 200 MG PO TABS
400.0000 mg | ORAL_TABLET | Freq: Four times a day (QID) | ORAL | Status: DC
  Administered 2020-03-15 – 2020-03-21 (×11): 400 mg via ORAL
  Filled 2020-03-15 (×11): qty 2

## 2020-03-15 MED ORDER — OXYCODONE HCL 5 MG PO TABS
5.0000 mg | ORAL_TABLET | Freq: Four times a day (QID) | ORAL | Status: DC | PRN
  Administered 2020-03-15 – 2020-03-21 (×5): 5 mg via ORAL
  Filled 2020-03-15 (×5): qty 1

## 2020-03-15 MED ORDER — HYDROMORPHONE HCL 1 MG/ML IJ SOLN
0.5000 mg | INTRAMUSCULAR | Status: DC | PRN
  Administered 2020-03-15 – 2020-03-19 (×18): 0.5 mg via INTRAVENOUS
  Filled 2020-03-15 (×18): qty 0.5

## 2020-03-15 NOTE — Evaluation (Signed)
Occupational Therapy Evaluation Patient Details Name: [name] MRN: [id number] DOB: 1929/09/04 Today's Date: 03/15/2020    History of Present Illness [name] is an 86 y.o. female with a past medical history of chronic hyponatremia, type 2 diabetes on insulin, hypothyroidism, hypertension, hyperlipidemia, left tib/fib fracture, breast cancer, brain tumor s/p surgery complicated with seizures, colon, recent hospitalization from 11/3-11/9 for laparoscopic right hemicolectomy with extensive adhesiolysis by Dr. Dema Severin on 11/3. Was discharged 11/9 for less than one day but had persistent generalized weakness, nausea and abdominal pain causing her to return to the ED soon after discharge.   Clinical Impression   [name] is an 86 year old woman who today presents with generalized weakness, poor activity tolerance and complaints of pain resulting in significant decline in functional abilities. Patient tolerated minimal activity today due to complaints of abdominal pain - needing assistance for rolling, supine to sit and return to bed. Patient's ADLs limited to bed level at this time as she is unable to tolerate upright seated position at this time. Set up for grooming and feeding, and mod - total assist for all other ADLs. Patient will benefit from skilled OT services while in hospital in order to improve deficits and learn compensatory strategies as needed in order to return PLOF.  Patient reports not wanting to go to rehab but does not have caregivers at night. If she wants to go home she needs 24/7 assistance as she is weak and deconditioned and unsafe to be alone at night. Discussed need for caregivers at night as well.    Follow Up Recommendations  SNF;Supervision/Assistance - 24 hour;Home health OT    Equipment Recommendations  None recommended by OT    Recommendations for Other Services       Precautions / Restrictions Precautions Precautions: Fall Restrictions Weight Bearing  Restrictions: No      Mobility Bed Mobility Overal bed mobility: Needs Assistance Bed Mobility: Rolling;Sidelying to Sit;Sit to Sidelying Rolling: Mod assist Sidelying to sit: Max assist Supine to sit: Mod assist     General bed mobility comments: Patient limited by weakness and complains of abdominal pain. Mod assist to roll to the left, max assist to transfer into sitting requiring guiding of lower extremities and trunk lift off. Unable to tolerate upright position and returned to supine - mod assist for LEs.    Transfers                      Balance                                           ADL either performed or assessed with clinical judgement   ADL Overall ADL's : Needs assistance/impaired Eating/Feeding: Set up;Bed level   Grooming: Set up;Bed level;Wash/dry face;Wash/dry hands   Upper Body Bathing: Moderate assistance;Bed level;Set up   Lower Body Bathing: Total assistance;Bed level   Upper Body Dressing : Moderate assistance;Set up;Bed level   Lower Body Dressing: Total assistance;Bed level     Toilet Transfer Details (indicate cue type and reason): unable to transfer due to pain. Toileting- Clothing Manipulation and Hygiene: Total assistance;Bed level Toileting - Clothing Manipulation Details (indicate cue type and reason): foley catheter             Vision Patient Visual Report: No change from baseline       Perception  Praxis      Pertinent Vitals/Pain Pain Assessment: 0-10 Pain Score: 10-Worst pain ever Pain Location: abdomen Pain Descriptors / Indicators: Sore;Guarding;Grimacing;Moaning Pain Intervention(s): Limited activity within patient's tolerance;Premedicated before session;Repositioned     Hand Dominance Right   Extremity/Trunk Assessment Upper Extremity Assessment Upper Extremity Assessment: Overall WFL for tasks assessed;RUE deficits/detail;LUE deficits/detail RUE Deficits / Details: Grossly  functional ROM but poor effort with MMT due to complaints of pain. RUE: Unable to fully assess due to pain LUE Deficits / Details: Grossly functional ROM but poor effort with MMT due to complaints of pain. LUE: Unable to fully assess due to pain   Lower Extremity Assessment Lower Extremity Assessment: Defer to PT evaluation       Communication Communication Communication: No difficulties   Cognition Arousal/Alertness: Awake/alert Behavior During Therapy: WFL for tasks assessed/performed Overall Cognitive Status: Within Functional Limits for tasks assessed                                     General Comments       Exercises     Shoulder Instructions      Home Living Family/patient expects to be discharged to:: Unsure Living Arrangements: Alone Available Help at Discharge: Personal care attendant (everyday 8 hours a day, alone at night) Type of Home: House Home Access: Stairs to enter [employer] of Steps: 4 Entrance Stairs-Rails: Right;Left Home Layout: One level;Other (Comment)     Bathroom Shower/Tub: Occupational psychologist: Standard     Home Equipment: Walker - standard;Toilet riser;Grab bars - toilet;Grab bars - tub/shower;Shower seat;Bedside commode   Additional Comments: Recently in rehab in December after tib/fib fracture.      Prior Functioning/Environment Level of Independence: Needs assistance  Gait / Transfers Assistance Needed: uses walker "mostly"; mostly household ambulation now but prior to recent R tib/fib fracture could ambulate in community ADL's / Homemaking Assistance Needed: patient reports caregivers help "with everything, I couldn't be there without them." can ambulate to bathroom for toileting mod I. caregivers Sun-Sat 8hrs  - reports they help her up in the morning and have her in pajamas and fed before they leave so that all she has to do is go to bed; states has BSC for toielting at night             OT Problem List: Decreased strength;Decreased activity tolerance;Impaired balance (sitting and/or standing);Pain;Decreased knowledge of use of DME or AE      OT Treatment/Interventions: Self-care/ADL training;Energy conservation;DME and/or AE instruction;Therapeutic activities;Balance training;Patient/family education    OT Goals(Current goals can be found in the care plan section) Acute Rehab OT Goals Patient Stated Goal: return home; does not want to go to rehab OT Goal Formulation: With patient Time For Goal Achievement: 03/29/20 Potential to Achieve Goals: Fair  OT Frequency: Min 2X/week   Barriers to D/C:            Co-evaluation              AM-PAC OT "6 Clicks" Daily Activity     Outcome Measure Help from another person eating meals?: A Little Help from another person taking care of personal grooming?: A Little Help from another person toileting, which includes using toliet, bedpan, or urinal?: Total Help from another person bathing (including washing, rinsing, drying)?: A Lot Help from another person to put on and taking off regular upper body clothing?: A Lot Help  from another person to put on and taking off regular lower body clothing?: Total 6 Click Score: 12   End of Session Nurse Communication:  (okay to see Per Rn)  Activity Tolerance: Patient limited by pain;Patient limited by fatigue Patient left: in bed;with call bell/phone within reach;with bed alarm set  OT Visit Diagnosis: Pain Pain - part of body:  (abdomen)                Time: 7544-9201 OT Time Calculation (min): 13 min Charges:  OT General Charges $OT Visit: 1 Visit OT Evaluation $OT Eval Moderate Complexity: 1 Mod  Lilyth Lawyer, OTR/L [place]  Office [phone] Pager: [place] 03/15/2020, 9:57 AM

## 2020-03-15 NOTE — Progress Notes (Signed)
Foley cath was removed at 1430 today. Patient due to void. purwick in place. Patient has not voided at 1830 and states " I don't feel like I have to pee". Bladder scan shows 42 mls. Patient was encouraged to drink fluids. Will pass along to oncoming RN.

## 2020-03-15 NOTE — Progress Notes (Signed)
Subjective 1 episode of emesis yesterday. Had mild nausea. Passing flatus and having plenty of good BMs. Denies n/v this am. Not interested in participating with therapies. Abdominal soreness stable relative to yesterday.   Objective: Vital signs in last 24 hours: Temp:  [98.4 F (36.9 C)-100.3 F (37.9 C)] 98.7 F (37.1 C) (11/12 0452) Pulse Rate:  [87-102] 87 (11/12 0452) Resp:  [18] 18 (11/12 0452) BP: (151-162)/(69-78) 157/76 (11/12 0452) SpO2:  [96 %-98 %] 98 % (11/12 0452) Last BM Date: 03/14/20  Intake/Output from previous day: 11/11 0701 - 11/12 0700 In: 1966 [P.O.:120; I.V.:1546; IV Piggyback:300] Out: 1050 [Urine:1050] Intake/Output this shift: No intake/output data recorded.  Gen: NAD, comfortable, mentation appropriate - A&O x3 CV: RRR Pulm: Normal work of breathing Abd: Soft, mild/mod tenderness in RLQ; no left sided tenderness; no rebound/guarding. Incisions c/d/i without erythema or drainage. Ext: SCDs in place  Lab Results: CBC  Recent Labs    03/14/20 0717 03/15/20 0513  WBC 12.1* 7.0  HGB 10.7* 10.4*  HCT 31.7* 32.1*  PLT 331 322   BMET Recent Labs    03/13/20 2050 03/15/20 0513  NA 129* 131*  K 5.9* 4.4  CL 101 104  CO2 19* 21*  GLUCOSE 137* 85  BUN 12 9  CREATININE 1.22* 0.95  CALCIUM 7.8* 7.4*   PT/INR Recent Labs    03/12/20 2010  LABPROT 12.6  INR 1.0   ABG No results for input(s): PHART, HCO3 in the last 72 hours.  Invalid input(s): PCO2, PO2  Studies/Results:  Anti-infectives: Anti-infectives (From admission, onward)   Start     Dose/Rate Route Frequency Ordered Stop   03/14/20 1630  fluconazole (DIFLUCAN) tablet 150 mg        150 mg Oral  Once 03/14/20 1541     03/13/20 2200  meropenem (MERREM) 1 g in sodium chloride 0.9 % 100 mL IVPB  Status:  Discontinued        1 g 200 mL/hr over 30 Minutes Intravenous Every 12 hours 03/13/20 1015 03/15/20 0923   03/13/20 0915  meropenem (MERREM) 1 g in sodium chloride 0.9 % 100  mL IVPB  Status:  Discontinued        1 g 200 mL/hr over 30 Minutes Intravenous Every 8 hours 03/13/20 0854 03/13/20 1015   03/12/20 2200  meropenem (MERREM) 1 g in sodium chloride 0.9 % 100 mL IVPB        1 g 200 mL/hr over 30 Minutes Intravenous  Once 03/12/20 2150 03/12/20 2355   03/12/20 1945  metroNIDAZOLE (FLAGYL) IVPB 500 mg        500 mg 100 mL/hr over 60 Minutes Intravenous  Once 03/12/20 1936 03/12/20 2133   03/12/20 1945  vancomycin (VANCOCIN) IVPB 1000 mg/200 mL premix        1,000 mg 200 mL/hr over 60 Minutes Intravenous  Once 03/12/20 1936 03/12/20 2133       Assessment/Plan: Patient Active Problem List   Diagnosis Date Noted  . Nausea & vomiting 03/12/2020  . Cancer of ascending colon pT3pN0 (0/12 LN) s/p lap right colectomy 03/06/2020 03/09/2020  . History of multiple allergies 03/09/2020  . S/P right hemicolectomy 03/06/2020  . Closed fracture of shaft of tibia 02/12/2020  . Gait abnormality 12/04/2019  . Impaired left ventricular function 09/05/2019  . Hypokalemia   . Effusion of left knee joint   . Hyponatremia   . Left patella fracture 10/26/2018  . Fracture of patella 10/26/2018  . Acquired hypothyroidism 08/25/2017  .  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 Hammer toe 08/25/2017  . History of breast cancer 08/25/2017  . Nontoxic thyroid nodule 08/25/2017  . Osteopetrosis 08/25/2017  . Dysuria 01/19/2017  . Vitamin D deficiency 11/18/2016  . Tremor, essential 08/18/2016  . Convulsions/seizures ([place]) 10/16/2014  . Brain tumor (benign) ([place]) 10/18/2013  . Subdural hemorrhage ([place]) 10/09/2013  . Anxiety 10/09/2013  . Compression fracture of T12 vertebra ([place]) 10/09/2013  . Nontoxic multinodular goiter 09/05/2013  . Syncope 08/27/2013  . Carotid artery disease ([place]) 08/27/2013  . Abnormal thyroid ultrasound 08/27/2013  . Cancer of central portion of female breast ([place]) 12/30/2012  . Osteoporosis 03/06/2010  . ASTHMA 03/05/2009  .  Asthma 03/05/2009  . IRRITABLE BOWEL SYNDROME  diarrhea type 03/21/2008  . ANEMIA-NOS 12/16/2006  . GERD 12/16/2006  . Insulin-requiring or dependent type II diabetes mellitus ([place]) 10/29/2006  . Mixed hyperlipidemia 10/29/2006  . Essential hypertension 10/29/2006  . Allergic rhinitis, cause unspecified 10/29/2006  . OSTEOARTHRITIS 10/29/2006   -Hgb remains stable -"Bariatric clears" for glycemic control reasons -Voiding trial -Afebrile, normal wbc, no evident source; abx discontinued -PPx: Imran restart chemical dvt ppx tomorrow [facility]) if her hgb remains stable -Her sister Tonia Ghent is at bedside; reviewed the above with her as well -Dispo: requesting [place] place if able   LOS: 3 days   Nadeen Landau, MD [facility] Use [email or web address] to contact on call provider

## 2020-03-15 NOTE — Progress Notes (Signed)
PROGRESS NOTE  [name] LFY:[id number] DOB: August 05, 1929 DOA: 03/12/2020 PCP: Bartholome Bill, MD  HPI/Recap of past 24 hours: [name] is an 86 y.o. female with a past medical history of chronic hyponatremia, IDDM2, hypothyroidism, hypertension, hyperlipidemia, left tib/fib fracture, breast cancer, brain tumor s/p surgery complicated with seizures, colon cancer, recent hospitalization from 11/3-11/9 for laparoscopic right hemicolectomy with extensive adhesiolysis by Dr. Dema Severin on 11/3 for colon cancer complicated by postop bleeding requiring blood transfusions.  Was discharged the day prior to this presentation but returns to the ED due to persistent generalized weakness, nausea and abdominal pain.  CT imaging was performed which showed mild small bowel dilatation and fluid within the abdomen consistent with blood.  CXR was unremarkable and initial lactate was elevated but repeat was normal.  Given her multiple medical comorbidities, TRH was consulted.  03/15/20: Reports abdominal pain.  Denies nausea.  Positive flatus with bowel movement.  Assessment/Plan: Active Problems:   Nausea & vomiting  Colon cancer s/p right hemicolectomy on 03/06/2020 by general surgery, Dr. Dema Severin 1. Management per primary team  Type 2 diabetes, well controlled. Hemoglobin A1c 8.6 on 03/04/2020. CBG stable Continue Lantus 10 units daily Continue to monitor CBGs  Acute blood loss anemia post surgery Hemoglobin is stable at 10.4 from 10.7 No overt bleeding  Improving hyponatremia Serum sodium 131 from 129 Currently on normal saline at 100 cc/h Reduce rate when tolerating oral  Resolving hyperkalemia Potassium downtrending Serum potassium 4.4 from 5.9  Seizure disorder Continue home AEDs Seizure precautions  Essential hypertension Continue home oral antihypertensives Continue to monitor vital signs  Hypothyroidism Obtain TSH Continue home Synthroid  History of brain tumor s/p  surgery  Possible vagina candidiasis  Per the patient she feels like she has a vagina yeast infection. 1 dose of oral fluconazole on 03/14/2020.   Thank you for this consultation.  Our [facility] hospitalist team will follow the patient with you.     Objective: Vitals:   03/14/20 1625 03/14/20 2058 03/15/20 0452 03/15/20 1356  BP: (!) 151/69 (!) 159/73 (!) 157/76 (!) 158/74  Pulse: (!) 102 (!) 102 87 85  Resp: 18 18 18 20   Temp: 100.3 F (37.9 C) 98.4 F (36.9 C) 98.7 F (37.1 C) 98.6 F (37 C)  TempSrc: Oral Oral Oral Oral  SpO2: 96% 97% 98% 98%  Weight:      Height:        Intake/Output Summary (Last 24 hours) at 03/15/2020 1427 Last data filed at 03/15/2020 0500 Gross per 24 hour  Intake 1846.01 ml  Output 850 ml  Net 996.01 ml   Filed Weights   03/12/20 2245  Weight: 65 kg    Exam:  . General: 86 y.o. year-old female well-developed well-nourished no distress.  Alert and mildly uncomfortable due to mild abdominal pain. . Cardiovascular: Regular rate and rhythm no rubs or gallops.  Marland Kitchen Respiratory: Clear to auscultation no wheezes or rales.  . Abdomen: Soft bowel sounds present.  Mild tenderness around umbilicus. . Musculoskeletal: No lower extremity edema bilaterally.  Marland Kitchen Psychiatry: Mood is appropriate for condition and setting.  Data Reviewed: CBC: Recent Labs  Lab 03/09/20 0558 03/09/20 0558 03/12/20 1846 03/12/20 1846 03/13/20 0525 03/13/20 2050 03/14/20 0239 03/14/20 0717 03/15/20 0513  WBC 7.9  --  23.0*  --  19.1*  --   --  12.1* 7.0  NEUTROABS  --   --  18.9*  --   --   --   --  9.0* 4.8  HGB 9.0*   < > 9.0*   < > 7.7* 10.8* 9.9* 10.7* 10.4*  HCT 28.0*   < > 28.2*   < > 25.2* 32.0* 29.7* 31.7* 32.1*  MCV 77.3*  --  79.7*  --  82.9  --   --  84.5 85.4  PLT 338  --  484*  --  422*  --   --  331 322   < > = values in this interval not displayed.   Basic Metabolic Panel: Recent Labs  Lab 03/11/20 0458 03/11/20 0458 03/12/20 0524  03/12/20 1846 03/13/20 0525 03/13/20 2050 03/15/20 0513  NA 130*   < > 130* 124* 125* 129* 131*  K 2.9*   < > 4.7 5.5* 5.3* 5.9* 4.4  CL 99   < > 101 96* 99 101 104  CO2 20*   < > 19* 18* 19* 19* 21*  GLUCOSE 108*   < > 81 253* 202* 137* 85  BUN <5*   < > 6* 8 10 12 9   CREATININE 0.54   < > 0.61 0.74 0.89 1.22* 0.95  CALCIUM 7.5*   < > 7.9* 7.6* 7.6* 7.8* 7.4*  MG 1.2*  --  2.0  --   --   --   --    < > = values in this interval not displayed.   GFR: Estimated Creatinine Clearance: 41.4 mL/min (by C-G formula based on SCr of 0.95 mg/dL). Liver Function Tests: Recent Labs  Lab 03/12/20 1846  AST 21  ALT 22  ALKPHOS 150*  BILITOT 0.5  PROT 5.7*  ALBUMIN 2.8*   Recent Labs  Lab 03/12/20 1846  LIPASE 28   No results for input(s): AMMONIA in the last 168 hours. Coagulation Profile: Recent Labs  Lab 03/12/20 2010  INR 1.0   Cardiac Enzymes: No results for input(s): CKTOTAL, CKMB, CKMBINDEX, TROPONINI in the last 168 hours. BNP (last 3 results) No results for input(s): PROBNP in the last 8760 hours. HbA1C: No results for input(s): HGBA1C in the last 72 hours. CBG: Recent Labs  Lab 03/14/20 1146 03/14/20 1627 03/14/20 2056 03/15/20 0743 03/15/20 1148  GLUCAP 133* 140* 151* 74 70   Lipid Profile: No results for input(s): CHOL, HDL, LDLCALC, TRIG, CHOLHDL, LDLDIRECT in the last 72 hours. Thyroid Function Tests: No results for input(s): TSH, T4TOTAL, FREET4, T3FREE, THYROIDAB in the last 72 hours. Anemia Panel: No results for input(s): VITAMINB12, FOLATE, FERRITIN, TIBC, IRON, RETICCTPCT in the last 72 hours. Urine analysis:    Component Value Date/Time   COLORURINE YELLOW 03/13/2020 [place] 03/13/2020 1112   LABSPEC 1.013 03/13/2020 1112   PHURINE 5.0 03/13/2020 1112   GLUCOSEU 50 (A) 03/13/2020 1112   GLUCOSEU NEGATIVE 12/28/2013 1036   HGBUR NEGATIVE 03/13/2020 1112   HGBUR trace-intact 06/29/2007 1304   BILIRUBINUR NEGATIVE  03/13/2020 1112   BILIRUBINUR negative 01/19/2017 1049   KETONESUR 5 (A) 03/13/2020 1112   PROTEINUR NEGATIVE 03/13/2020 1112   UROBILINOGEN 0.2 01/19/2017 1049   UROBILINOGEN 1.0 03/25/2015 1755   NITRITE NEGATIVE 03/13/2020 1112   LEUKOCYTESUR NEGATIVE 03/13/2020 1112   Sepsis Labs: @LABRCNTIP (procalcitonin:4,lacticidven:4)  ) Recent Results (from the past 240 hour(s))  Respiratory Panel by RT PCR (Flu A&B, Covid) - Nasopharyngeal Swab     Status: None   Collection Time: 03/06/20  8:17 AM   Specimen: Nasopharyngeal Swab  Result Value Ref Range Status   SARS Coronavirus 2 by RT PCR NEGATIVE NEGATIVE Final  Comment: (NOTE) SARS-CoV-2 target nucleic acids are NOT DETECTED.  The SARS-CoV-2 RNA is generally detectable in upper respiratoy specimens during the acute phase of infection. The lowest concentration of SARS-CoV-2 viral copies this assay can detect is 131 copies/mL. A negative result does not preclude SARS-Cov-2 infection and should not be used as the sole basis for treatment or other patient management decisions. A negative result Juul occur with  improper specimen collection/handling, submission of specimen other than nasopharyngeal swab, presence of viral mutation(s) within the areas targeted by this assay, and inadequate number of viral copies (<131 copies/mL). A negative result must be combined with clinical observations, patient history, and epidemiological information. The expected result is Negative.  Fact Sheet for Patients:  PinkCheek.be  Fact Sheet for Healthcare Providers:  GravelBags.it  This test is no t yet approved or cleared by the [place] FDA and  has been authorized for detection and/or diagnosis of SARS-CoV-2 by FDA under an Emergency Use Authorization (EUA). This EUA will remain  in effect (meaning this test can be used) for the duration of the COVID-19 declaration under Section  564(b)(1) of the Act, 21 U.S.C. section 360bbb-3(b)(1), unless the authorization is terminated or revoked sooner.     Influenza A by PCR NEGATIVE NEGATIVE Final   Influenza B by PCR NEGATIVE NEGATIVE Final    Comment: (NOTE) The Xpert Xpress SARS-CoV-2/FLU/RSV assay is intended as an aid in  the diagnosis of influenza from Nasopharyngeal swab specimens and  should not be used as a sole basis for treatment. Nasal washings and  aspirates are unacceptable for Xpert Xpress SARS-CoV-2/FLU/RSV  testing.  Fact Sheet for Patients: PinkCheek.be  Fact Sheet for Healthcare Providers: GravelBags.it  This test is not yet approved or cleared by the [place] FDA and  has been authorized for detection and/or diagnosis of SARS-CoV-2 by  FDA under an Emergency Use Authorization (EUA). This EUA will remain  in effect (meaning this test can be used) for the duration of the  Covid-19 declaration under Section 564(b)(1) of the Act, 21  U.S.C. section 360bbb-3(b)(1), unless the authorization is  terminated or revoked. Performed at [facility], [place] [address] [place], [place] [address]   Blood Culture (routine x 2)     Status: None (Preliminary result)   Collection Time: 03/12/20  8:00 PM   Specimen: BLOOD  Result Value Ref Range Status   Specimen Description   Final    BLOOD RIGHT ANTECUBITAL Performed at [facility] [address], [place], [place] [address]    Special Requests   Final    BOTTLES DRAWN AEROBIC AND ANAEROBIC Blood Culture adequate volume Performed at [place] [address] [place], [place] [address]    Culture   Final    NO GROWTH 3 DAYS Performed at [facility], [place] [address] [place], [place] [address]    Report Status PENDING  Incomplete  Blood Culture (routine x 2)     Status: None (Preliminary result)   Collection Time: 03/12/20  8:10  PM   Specimen: BLOOD RIGHT FOREARM  Result Value Ref Range Status   Specimen Description   Final    BLOOD RIGHT FOREARM Performed at [place] [address] [place], [place] [address]    Special Requests   Final    BOTTLES DRAWN AEROBIC AND ANAEROBIC Blood Culture adequate volume Performed at [facility] [address] [place], [place] [address]  Culture   Final    NO GROWTH 3 DAYS Performed at [facility], [place] [address] [place], [place] [address]    Report Status PENDING  Incomplete  Respiratory Panel by RT PCR (Flu A&B, Covid) - Nasopharyngeal Swab     Status: None   Collection Time: 03/12/20  8:42 PM   Specimen: Nasopharyngeal Swab  Result Value Ref Range Status   SARS Coronavirus 2 by RT PCR NEGATIVE NEGATIVE Final    Comment: (NOTE) SARS-CoV-2 target nucleic acids are NOT DETECTED.  The SARS-CoV-2 RNA is generally detectable in upper respiratoy specimens during the acute phase of infection. The lowest concentration of SARS-CoV-2 viral copies this assay can detect is 131 copies/mL. A negative result does not preclude SARS-Cov-2 infection and should not be used as the sole basis for treatment or other patient management decisions. A negative result Brunner occur with  improper specimen collection/handling, submission of specimen other than nasopharyngeal swab, presence of viral mutation(s) within the areas targeted by this assay, and inadequate number of viral copies (<131 copies/mL). A negative result must be combined with clinical observations, patient history, and epidemiological information. The expected result is Negative.  Fact Sheet for Patients:  PinkCheek.be  Fact Sheet for Healthcare Providers:  GravelBags.it  This test is no t yet approved or cleared by the [place] FDA and  has been authorized for detection and/or diagnosis of SARS-CoV-2  by FDA under an Emergency Use Authorization (EUA). This EUA will remain  in effect (meaning this test can be used) for the duration of the COVID-19 declaration under Section 564(b)(1) of the Act, 21 U.S.C. section 360bbb-3(b)(1), unless the authorization is terminated or revoked sooner.     Influenza A by PCR NEGATIVE NEGATIVE Final   Influenza B by PCR NEGATIVE NEGATIVE Final    Comment: (NOTE) The Xpert Xpress SARS-CoV-2/FLU/RSV assay is intended as an aid in  the diagnosis of influenza from Nasopharyngeal swab specimens and  should not be used as a sole basis for treatment. Nasal washings and  aspirates are unacceptable for Xpert Xpress SARS-CoV-2/FLU/RSV  testing.  Fact Sheet for Patients: PinkCheek.be  Fact Sheet for Healthcare Providers: GravelBags.it  This test is not yet approved or cleared by the [place] FDA and  has been authorized for detection and/or diagnosis of SARS-CoV-2 by  FDA under an Emergency Use Authorization (EUA). This EUA will remain  in effect (meaning this test can be used) for the duration of the  Covid-19 declaration under Section 564(b)(1) of the Act, 21  U.S.C. section 360bbb-3(b)(1), unless the authorization is  terminated or revoked. Performed at [facility], [place] [address], [place], [place] [address]   Urine culture     Status: None   Collection Time: 03/13/20 11:12 AM   Specimen: In/Out Cath Urine  Result Value Ref Range Status   Specimen Description   Final    IN/OUT CATH URINE Performed at [facility] [address], [place], [place] [address]    Special Requests   Final    NONE Performed at [facility], [place] [address] [place], [place] [address]    Culture   Final    NO GROWTH Performed at [facility], [place] [address], [place], [place] [address]    Report Status 03/14/2020 FINAL  Final       Studies: No results found.  Scheduled Meds: . acetaminophen  650 mg Oral Q6H  . Chlorhexidine Gluconate Cloth  6 each Topical Daily  . fluconazole  150 mg Oral Once  . ibuprofen  400 mg Oral Q6H  . insulin glargine  10 Units Subcutaneous QHS  . levothyroxine  75 mcg Oral QAC breakfast  . phenytoin  100 mg Oral Daily   And  . phenytoin  50 mg Oral Daily  . phenytoin  200 mg Oral QHS  . primidone  150 mg Oral Daily   And  . primidone  100 mg Oral QHS  . vitamin B-1  250 mg Oral QHS  . trandolapril  2 mg Oral QHS   And  . verapamil  240 mg Oral QHS    Continuous Infusions: . sodium chloride 100 mL/hr at 03/15/20 1032     LOS: 3 days     Kayleen Memos, MD [facility] Pager [phone]  If 7PM-7AM, please contact night-coverage [email or web address] Password [facility] 03/15/2020, 2:27 PM

## 2020-03-15 NOTE — TOC Progression Note (Signed)
Transition of Care [facility]) - Progression Note    Patient Details  Name: [name] MRN: [id number] Date of Birth: 1929-07-17  Transition of Care [facility]) CM/SW Contact  Torri Michalski, Juliann Pulse, RN Phone Number: 03/15/2020, 10:53 AM  Clinical Narrative: Active w/[facility]-HHPT/OT. Noted OT-SNF-will await PT eval, & recc.      Expected Discharge Plan: [place] Barriers to Discharge: Continued Medical Work up  Expected Discharge Plan and Services Expected Discharge Plan: [place]   Discharge Planning Services: CM Consult   Living arrangements for the past 2 months: Single Family Home                                       Social Determinants of Health (SDOH) Interventions    Readmission Risk Interventions Readmission Risk Prevention Plan 03/13/2020  PCP or Specialist Appt within 3-5 Days Complete  HRI or [place] Complete  Social Work Consult for [place] Planning/Counseling Complete  Palliative Care Screening Complete  Medication Review [employer]) Complete  Some recent data might be hidden

## 2020-03-15 NOTE — Progress Notes (Signed)
PT Cancellation Note  Patient Details Name: [name] MRN: [id number] DOB: 06-14-1929   Cancelled Treatment:    Reason Eval/Treat Not Completed: Fatigue/lethargy limiting ability to participate Pt just received pain meds and reports she cannot participate at this time because the meds "knock me out."  Pt encouraged to attempt while pain meds on board due to her severe abdominal pain however pt states she cannot.  She requests therapy come back before she gets her medication.  Therapist mentioned pt would then be in more pain as she is supposed to ask for pain meds when hurting and asked if she could tolerate mobility in pain.  Pt not able answer, just stating again to come back before she get her pain meds.   Nicole Hafley,KATHrine E 03/15/2020, 2:33 PM Jannette Spanner PT, DPT Acute Rehabilitation Services Pager: [phone] Office: [phone]

## 2020-03-15 NOTE — Plan of Care (Signed)
  Problem: Education: Goal: Knowledge of General Education information will improve Description: Including pain rating scale, medication(s)/side effects and non-pharmacologic comfort measures Outcome: Progressing   Problem: Health Behavior/Discharge Planning: Goal: Ability to manage health-related needs will improve Outcome: Progressing   Problem: Clinical Measurements: Goal: Ability to maintain clinical measurements within normal limits will improve Outcome: Progressing Goal: Will remain free from infection Outcome: Progressing Goal: Diagnostic test results will improve Outcome: Progressing Goal: Respiratory complications will improve Outcome: Progressing Goal: Cardiovascular complication will be avoided Outcome: Progressing   Problem: Coping: Goal: Level of anxiety will decrease Outcome: Progressing   Problem: Pain Managment: Goal: General experience of comfort will improve Outcome: Progressing   Problem: Safety: Goal: Ability to remain free from injury will improve Outcome: Progressing   Problem: Skin Integrity: Goal: Risk for impaired skin integrity will decrease Outcome: Progressing   Problem: Activity: Goal: Risk for activity intolerance will decrease Outcome: Not Progressing   Problem: Nutrition: Goal: Adequate nutrition will be maintained\ Intervention:  Continue to monitor oral and po intake; if no improvement or weight decreases consult dietician  Outcome: Not Progressing   Problem: Elimination: Goal: Will not experience complications related to bowel motility Intervetion: Start bowel program and give medications for constipation per md order if warranted Outcome: Not Progressing Goal: Will not experience complications related to urinary retention Intervention: Bladder scan per needed; continue to monitor pt for suprapubic pain or other discomfort related to bladder  Outcome: Not Progressing

## 2020-03-16 DIAGNOSIS — R112 Nausea with vomiting, unspecified: Secondary | ICD-10-CM | POA: Diagnosis not present

## 2020-03-16 LAB — COMPREHENSIVE METABOLIC PANEL
ALT: 15 U/L (ref 0–44)
AST: 19 U/L (ref 15–41)
Albumin: 2.4 g/dL — ABNORMAL LOW (ref 3.5–5.0)
Alkaline Phosphatase: 101 U/L (ref 38–126)
Anion gap: 15 (ref 5–15)
BUN: 8 mg/dL (ref 8–23)
CO2: 11 mmol/L — ABNORMAL LOW (ref 22–32)
Calcium: 7.4 mg/dL — ABNORMAL LOW (ref 8.9–10.3)
Chloride: 100 mmol/L (ref 98–111)
Creatinine, Ser: 0.8 mg/dL (ref 0.44–1.00)
GFR, Estimated: >60 mL/min (ref >60)
Glucose, Bld: 144 mg/dL — ABNORMAL HIGH (ref 70–99)
Potassium: 4.4 mmol/L (ref 3.5–5.1)
Sodium: 126 mmol/L — ABNORMAL LOW (ref 135–145)
Total Bilirubin: 1.4 mg/dL — ABNORMAL HIGH (ref 0.3–1.2)
Total Protein: 5.1 g/dL — ABNORMAL LOW (ref 6.5–8.1)

## 2020-03-16 LAB — CBC WITH DIFFERENTIAL/PLATELET
Abs Immature Granulocytes: 0.1 10*3/uL — ABNORMAL HIGH (ref 0.00–0.07)
Basophils Absolute: 0.1 10*3/uL (ref 0.0–0.1)
Basophils Relative: 1 %
Eosinophils Absolute: 0.2 10*3/uL (ref 0.0–0.5)
Eosinophils Relative: 2 %
HCT: 32.2 % — ABNORMAL LOW (ref 36.0–46.0)
Hemoglobin: 10.3 g/dL — ABNORMAL LOW (ref 12.0–15.0)
Immature Granulocytes: 1 %
Lymphocytes Relative: 9 %
Lymphs Abs: 0.8 10*3/uL (ref 0.7–4.0)
MCH: 27.6 pg (ref 26.0–34.0)
MCHC: 32 g/dL (ref 30.0–36.0)
MCV: 86.3 fL (ref 80.0–100.0)
Monocytes Absolute: 0.7 10*3/uL (ref 0.1–1.0)
Monocytes Relative: 8 %
Neutro Abs: 7.8 10*3/uL — ABNORMAL HIGH (ref 1.7–7.7)
Neutrophils Relative %: 79 %
Platelets: 389 10*3/uL (ref 150–400)
RBC: 3.73 MIL/uL — ABNORMAL LOW (ref 3.87–5.11)
RDW: 20.6 % — ABNORMAL HIGH (ref 11.5–15.5)
WBC: 9.7 10*3/uL (ref 4.0–10.5)
nRBC: 0 % (ref 0.0–0.2)

## 2020-03-16 LAB — LACTIC ACID, PLASMA: Lactic Acid, Venous: 0.7 mmol/L (ref 0.5–1.9)

## 2020-03-16 LAB — GLUCOSE, CAPILLARY
Glucose-Capillary: 137 mg/dL — ABNORMAL HIGH (ref 70–99)
Glucose-Capillary: 145 mg/dL — ABNORMAL HIGH (ref 70–99)
Glucose-Capillary: 129 mg/dL — ABNORMAL HIGH (ref 70–99)
Glucose-Capillary: 125 mg/dL — ABNORMAL HIGH (ref 70–99)

## 2020-03-16 LAB — PROCALCITONIN: Procalcitonin: <0.1 ng/mL

## 2020-03-16 LAB — TSH: TSH: 2.729 u[IU]/mL (ref 0.350–4.500)

## 2020-03-16 LAB — HIV ANTIBODY (ROUTINE TESTING W REFLEX): HIV Screen 4th Generation wRfx: NONREACTIVE

## 2020-03-16 MED ORDER — ENOXAPARIN SODIUM 40 MG/0.4ML ~~LOC~~ SOLN
40.0000 mg | SUBCUTANEOUS | Status: DC
  Administered 2020-03-16 – 2020-03-21 (×6): 40 mg via SUBCUTANEOUS
  Filled 2020-03-16 (×7): qty 0.4

## 2020-03-16 NOTE — Evaluation (Signed)
Physical Therapy Evaluation Patient Details Name: [name]: [id number] DOB: 01/10/1930 Today's Date: 03/16/2020   History of Present Illness  [name] is an 86 y.o. female with a past medical history of chronic hyponatremia, IDDM2, hypothyroidism, hypertension, hyperlipidemia, left tib/fib fracture, breast cancer, brain tumor s/p surgery complicated with seizures, colon cancer, recent hospitalization from 11/3-11/9 for laparoscopic right hemicolectomy with extensive adhesiolysis by Dr. Dema Severin on 11/3 for colon cancer complicated by postop bleeding requiring blood transfusions.  Was discharged the day prior to this presentation but returns to the ED due to persistent generalized weakness, nausea and abdominal pain.  CT imaging was performed which showed mild small bowel dilatation and fluid within the abdomen consistent with blood.  Clinical Impression   Pt admitted with above diagnosis. At evaluation pt was significantly limited by pain. PT with multiple attempts in the past in which pt refused due to lethargy from pain meds, she had requested pt prior to meds.  PT saw pt today, prior to pain meds, and she was very limited due to nausea and pain.  Encouraged pt to participate as able to prevent further illnesses that occur from immobility (blood clots, PNE, further abdominal issues, further weakness) and she agreed, but pt moaning and crying with mobility and was only able to take a few steps.   Returned to supine and RN in with medications.  Pt does not have 24 hr support and presents with limited mobility - will need SNF at discharge from PT perspective. Pt currently with functional limitations due to the deficits listed below (see PT Problem List). Pt will benefit from skilled PT to increase their independence and safety with mobility to allow discharge to the venue listed below.       Follow Up Recommendations SNF    Equipment Recommendations  None recommended by PT    Recommendations  for Other Services       Precautions / Restrictions Precautions Precautions: Fall      Mobility  Bed Mobility Overal bed mobility: Needs Assistance Bed Mobility: Rolling;Sidelying to Sit;Sit to Sidelying Rolling: Mod assist Sidelying to sit: Mod assist     Sit to sidelying: Mod assist General bed mobility comments: Increased time for all; rolling both sides with cues and mod A for toielting ADLs; mod A to lift trunk with sititng and for legs back to supine; limited due to pain    Transfers Overall transfer level: Needs assistance Equipment used: Rolling walker (2 wheeled) Transfers: Sit to/from Stand Sit to Stand: Min assist            Ambulation/Gait Ambulation/Gait assistance: Herbalist (Feet): 2 Feet Assistive device: Rolling walker (2 wheeled) Gait Pattern/deviations: Step-to pattern;Decreased stride length Gait velocity: decreased   General Gait Details: side steps to [facility] only  Science writer    Modified Rankin (Stroke Patients Only)       Balance Overall balance assessment: Needs assistance Sitting-balance support: No upper extremity supported Sitting balance-Leahy Scale: Good     Standing balance support: Bilateral upper extremity supported Standing balance-Leahy Scale: Poor Standing balance comment: required RW                             Pertinent Vitals/Pain Pain Assessment: 0-10 Pain Score: 10-Worst pain ever Faces Pain Scale: Hurts worst Pain Location: abdomen Pain Descriptors / Indicators: Grimacing;Moaning;Restless Pain Intervention(s): Limited  activity within patient's tolerance;Patient requesting pain meds-RN notified;RN gave pain meds during session;Repositioned;Relaxation (at last attempt pt request PT prior to pain meds due to lethargy)    Home Living Family/patient expects to be discharged to:: Skilled nursing facility Living Arrangements: Alone Available Help at  Discharge: Personal care attendant (8 hrs daily; alone at night) Type of Home: House Home Access: Stairs to enter Entrance Stairs-Rails: Right;Left Entrance Stairs-Number of Steps: 4 Home Layout: One level;Other (Comment) Home Equipment: Walker - standard;Toilet riser;Grab bars - toilet;Grab bars - tub/shower;Shower seat;Bedside commode      Prior Function Level of Independence: Needs assistance   Gait / Transfers Assistance Needed: uses walker "mostly"; mostly household ambulation now but prior to recent R tib/fib fracture could ambulate in community; cleared for WBAT on R LE per pt  ADL's / Homemaking Assistance Needed: patient reports caregivers help "with everything, I couldn't be there without them." can ambulate to bathroom for toileting mod I. caregivers Sun-Sat 8hrs  - reports they help her up in the morning and have her in pajamas and fed before they leave so that all she has to do is go to bed; states has BSC for toielting at night        Hand Dominance   Dominant Hand: Right    Extremity/Trunk Assessment   Upper Extremity Assessment Upper Extremity Assessment: Defer to OT evaluation    Lower Extremity Assessment Lower Extremity Assessment: LLE deficits/detail;RLE deficits/detail RLE Deficits / Details: ROM WFL; MMT 4/5 LLE Deficits / Details: ROM WFL; MMT 4/5    Cervical / Trunk Assessment Cervical / Trunk Assessment: Other exceptions Cervical / Trunk Exceptions: kyphotic/trunk flexed due to pain  Communication   Communication: No difficulties  Cognition Arousal/Alertness: Awake/alert Behavior During Therapy: WFL for tasks assessed/performed Overall Cognitive Status: Difficult to assess                                 General Comments: Pt with pain and moaning/crying and frequently stating "oh my" - symptoms increased when returned to bed.  She followed commands with increased time but limited communication due to pain.      General Comments  General comments (skin integrity, edema, etc.): Pt was soiled (loose BM) at arrival. .  PT cleaned pt and changed bed pad. RN in at end of session to give pain meds and nausea meds.    Exercises     Assessment/Plan    PT Assessment Patient needs continued PT services  PT Problem List Decreased strength;Decreased mobility;Decreased activity tolerance;Decreased balance;Decreased knowledge of use of DME;Pain;Decreased safety awareness;Decreased range of motion       PT Treatment Interventions DME instruction;Therapeutic activities;Modalities;Gait training;Therapeutic exercise;Patient/family education;Stair training;Balance training;Functional mobility training    PT Goals (Current goals can be found in the Care Plan section)  Acute Rehab PT Goals Patient Stated Goal: decrease pain PT Goal Formulation: With patient Time For Goal Achievement: 03/30/20 Potential to Achieve Goals: Good    Frequency Min 3X/week   Barriers to discharge Decreased caregiver support      Co-evaluation               AM-PAC PT "6 Clicks" Mobility  Outcome Measure Help needed turning from your back to your side while in a flat bed without using bedrails?: A Lot Help needed moving from lying on your back to sitting on the side of a flat bed without using bedrails?: A Lot Help needed  moving to and from a bed to a chair (including a wheelchair)?: A Lot Help needed standing up from a chair using your arms (e.g., wheelchair or bedside chair)?: A Lot Help needed to walk in hospital room?: A Lot Help needed climbing 3-5 steps with a railing? : Total 6 Click Score: 11    End of Session   Activity Tolerance: Patient limited by pain Patient left: in bed;with call bell/phone within reach;with bed alarm set Nurse Communication: Mobility status;Other (comment);Patient requests pain meds (had BM) PT Visit Diagnosis: Other abnormalities of gait and mobility (R26.89);Muscle weakness (generalized)  (M62.81);Unsteadiness on feet (R26.81);Pain Pain - part of body:  (abdomen)    Time: 1510-1535 PT Time Calculation (min) (ACUTE ONLY): 25 min   Charges:   PT Evaluation $PT Eval Moderate Complexity: 1 Mod PT Treatments $Therapeutic Activity: 8-22 mins        Abran Richard, PT Acute Rehab Services Pager [phone] [facility] [phone]    Karlton Lemon 03/16/2020, 3:50 PM

## 2020-03-16 NOTE — Progress Notes (Signed)
Patient declined all scheduled medications today due to nausaa.  Patient c/o significant nausea and pain this morning, and again this evening after working with physical therapy.  Nausea and pain relieved by phenergan and dilaudid (ordered).  PO intake very poor. Incontinent of small amounts of liquid stool x2.

## 2020-03-16 NOTE — Progress Notes (Addendum)
Subjective No recorded episodes of emesis yesterday. Had mild nausea. Passing flatus and having plenty of good BMs. Objective: Vital signs in last 24 hours: Temp:  [98 F (36.7 C)-98.6 F (37 C)] 98.1 F (36.7 C) (11/13 0459) Pulse Rate:  [85-91] 91 (11/13 0459) Resp:  [18-20] 18 (11/13 0459) BP: (148-158)/(73-80) 149/80 (11/13 0459) SpO2:  [97 %-98 %] 97 % (11/13 0459) Last BM Date: 03/15/20  Intake/Output from previous day: 11/12 0701 - 11/13 0700 In: -  Out: 350 [Urine:350] Intake/Output this shift: No intake/output data recorded.  Gen: NAD, comfortable, mentation appropriate - A&O x3 CV: RRR Pulm: Normal work of breathing Abd: Soft, mild/mod tenderness in RLQ; no left sided tenderness; no rebound/guarding. Incisions c/d/i without erythema or drainage. Ext: SCDs in place  Lab Results: CBC  Recent Labs    03/14/20 0717 03/15/20 0513  WBC 12.1* 7.0  HGB 10.7* 10.4*  HCT 31.7* 32.1*  PLT 331 322   BMET Recent Labs    03/13/20 2050 03/15/20 0513  NA 129* 131*  K 5.9* 4.4  CL 101 104  CO2 19* 21*  GLUCOSE 137* 85  BUN 12 9  CREATININE 1.22* 0.95  CALCIUM 7.8* 7.4*   PT/INR No results for input(s): LABPROT, INR in the last 72 hours. ABG No results for input(s): PHART, HCO3 in the last 72 hours.  Invalid input(s): PCO2, PO2  Studies/Results:  Anti-infectives: Anti-infectives (From admission, onward)   Start     Dose/Rate Route Frequency Ordered Stop   03/14/20 1630  fluconazole (DIFLUCAN) tablet 150 mg        150 mg Oral  Once 03/14/20 1541     03/13/20 2200  meropenem (MERREM) 1 g in sodium chloride 0.9 % 100 mL IVPB  Status:  Discontinued        1 g 200 mL/hr over 30 Minutes Intravenous Every 12 hours 03/13/20 1015 03/15/20 0923   03/13/20 0915  meropenem (MERREM) 1 g in sodium chloride 0.9 % 100 mL IVPB  Status:  Discontinued        1 g 200 mL/hr over 30 Minutes Intravenous Every 8 hours 03/13/20 0854 03/13/20 1015   03/12/20 2200  meropenem  (MERREM) 1 g in sodium chloride 0.9 % 100 mL IVPB        1 g 200 mL/hr over 30 Minutes Intravenous  Once 03/12/20 2150 03/12/20 2355   03/12/20 1945  metroNIDAZOLE (FLAGYL) IVPB 500 mg        500 mg 100 mL/hr over 60 Minutes Intravenous  Once 03/12/20 1936 03/12/20 2133   03/12/20 1945  vancomycin (VANCOCIN) IVPB 1000 mg/200 mL premix        1,000 mg 200 mL/hr over 60 Minutes Intravenous  Once 03/12/20 1936 03/12/20 2133       Assessment/Plan: Patient Active Problem List   Diagnosis Date Noted  . Nausea & vomiting 03/12/2020  . Cancer of ascending colon pT3pN0 (0/12 LN) s/p lap right colectomy 03/06/2020 03/09/2020  . History of multiple allergies 03/09/2020  . S/P right hemicolectomy 03/06/2020  . Closed fracture of shaft of tibia 02/12/2020  . Gait abnormality 12/04/2019  . Impaired left ventricular function 09/05/2019  . Hypokalemia   . Effusion of left knee joint   . Hyponatremia   . Left patella fracture 10/26/2018  . Fracture of patella 10/26/2018  . Acquired hypothyroidism 08/25/2017  . 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 Hammer toe 08/25/2017  . History of breast cancer 08/25/2017  .  Nontoxic thyroid nodule 08/25/2017  . Osteopetrosis 08/25/2017  . Dysuria 01/19/2017  . Vitamin D deficiency 11/18/2016  . Tremor, essential 08/18/2016  . Convulsions/seizures ([place]) 10/16/2014  . Brain tumor (benign) ([place]) 10/18/2013  . Subdural hemorrhage ([place]) 10/09/2013  . Anxiety 10/09/2013  . Compression fracture of T12 vertebra ([place]) 10/09/2013  . Nontoxic multinodular goiter 09/05/2013  . Syncope 08/27/2013  . Carotid artery disease ([place]) 08/27/2013  . Abnormal thyroid ultrasound 08/27/2013  . Cancer of central portion of female breast ([place]) 12/30/2012  . Osteoporosis 03/06/2010  . ASTHMA 03/05/2009  . Asthma 03/05/2009  . IRRITABLE BOWEL SYNDROME  diarrhea type 03/21/2008  . ANEMIA-NOS 12/16/2006  . GERD 12/16/2006  . Insulin-requiring or dependent  type II diabetes mellitus ([place]) 10/29/2006  . Mixed hyperlipidemia 10/29/2006  . Essential hypertension 10/29/2006  . Allergic rhinitis, cause unspecified 10/29/2006  . OSTEOARTHRITIS 10/29/2006   -Hgb remains stable -"Bariatric clears" for glycemic control reasons, cont this today and will advance in AM if no further emesis -Afebrile, normal wbc, no evident source; abx discontinued -PPx: restart chemical dvt ppx, hgb stable  -Dispo: requesting [place] place if able   LOS: 4 days  Rosario Adie, MD  [facility]

## 2020-03-16 NOTE — Progress Notes (Signed)
PROGRESS NOTE  [name] OFB:[id number] DOB: 1929/08/14 DOA: 03/12/2020 PCP: Bartholome Bill, MD  HPI/Recap of past 24 hours: [name] is an 86 y.o. female with a past medical history of chronic hyponatremia, IDDM2, hypothyroidism, hypertension, hyperlipidemia, left tib/fib fracture, breast cancer, brain tumor s/p surgery complicated with seizures, colon cancer, recent hospitalization from 11/3-11/9 for laparoscopic right hemicolectomy with extensive adhesiolysis by Dr. Dema Severin on 11/3 for colon cancer complicated by postop bleeding requiring blood transfusions.  Was discharged the day prior to this presentation but returns to the ED due to persistent generalized weakness, nausea and abdominal pain.  CT imaging was performed which showed mild small bowel dilatation and fluid within the abdomen consistent with blood.  CXR was unremarkable and initial lactate was elevated but repeat was normal.  Given her multiple medical comorbidities, TRH was consulted.  03/16/20: Reports some abdominal discomfort.  No vomiting.  Labs essentially unremarkable except for drop in serum Na+ down to 126 from 131.  Will continue NS IV fluid and repeat serum Na+ this afternoon.  Her Hg is stable, she has been afebrile with no leukocytosis.  Advise to mobilize as tolerated, OOB to chair with every shift.  Encourage oral intake as tolerated.  Assessment/Plan: Active Problems:   Nausea & vomiting  Colon cancer s/p right hemicolectomy on 03/06/2020 by general surgery, Dr. Dema Severin 1. Management per primary team  Type 2 diabetes, well controlled. Hemoglobin A1c 8.6 on 03/04/2020. CBG stable Continue Lantus 10 units daily Continue to monitor CBGs  Abdominal discomfort No evidence of systemic infection or active infective process Afebrile with no leukocytosis Lactic acid, procalcitonin negative Mobilize Encourage po intake  Acute blood loss anemia post surgery Hemoglobin is stable at 10.3 from 10.4 from 10.7 No  overt bleeding  Hypovolemic hyponatremia Serum sodium 126 from 131 from 129       Will continue NS IV fluid and repeat serum Na+ this afternoon.  Resolved hyperkalemia Potassium downtrending Serum potassium 4.4 from 5.9  Seizure disorder Continue home AEDs Seizure precautions  Essential hypertension BP is stable Continue home oral antihypertensives Continue to monitor vital signs  Hypothyroidism TSH normal 2.729 Continue home Synthroid  History of brain tumor s/p surgery  Possible vagina candidiasis  Per the patient she feels like she has a vagina yeast infection. 1 dose of oral fluconazole on 03/14/2020.  Ambulatory dysfunction PT OT rec SNF Hopefully she would work with PT today OOB to chair every shift   Thank you for this consultation.  Our [facility] hospitalist team will follow the patient with you.     Objective: Vitals:   03/15/20 1356 03/15/20 2137 03/16/20 0459 03/16/20 1017  BP: (!) 158/74 (!) 148/73 (!) 149/80 136/60  Pulse: 85 87 91 98  Resp: 20 18 18 16   Temp: 98.6 F (37 C) 98 F (36.7 C) 98.1 F (36.7 C) 97.9 F (36.6 C)  TempSrc: Oral Oral Oral Oral  SpO2: 98% 97% 97% 94%  Weight:      Height:        Intake/Output Summary (Last 24 hours) at 03/16/2020 1248 Last data filed at 03/16/2020 0100 Gross per 24 hour  Intake --  Output 350 ml  Net -350 ml   Filed Weights   03/12/20 2245  Weight: 65 kg    Exam:  . General: 86 y.o. year-old female WD WN uncomfortable due to abd discomfort. . Cardiovascular: RRR no rubs or gallops . Respiratory: CTA no wheezes or rales . Abdomen: BS  present. Non distended. . Musculoskeletal: No LE edema . Psychiatry: Mood is appropriate  Data Reviewed: CBC: Recent Labs  Lab 03/12/20 1846 03/12/20 1846 03/13/20 0525 03/13/20 0525 03/13/20 2050 03/14/20 0239 03/14/20 0717 03/15/20 0513 03/16/20 1105  WBC 23.0*  --  19.1*  --   --   --  12.1* 7.0 9.7  NEUTROABS 18.9*  --   --   --   --   --   9.0* 4.8 7.8*  HGB 9.0*   < > 7.7*   < > 10.8* 9.9* 10.7* 10.4* 10.3*  HCT 28.2*   < > 25.2*   < > 32.0* 29.7* 31.7* 32.1* 32.2*  MCV 79.7*  --  82.9  --   --   --  84.5 85.4 86.3  PLT 484*  --  422*  --   --   --  331 322 389   < > = values in this interval not displayed.   Basic Metabolic Panel: Recent Labs  Lab 03/11/20 0458 03/11/20 0458 03/12/20 0524 03/12/20 0524 03/12/20 1846 03/13/20 0525 03/13/20 2050 03/15/20 0513 03/16/20 1105  NA 130*   < > 130*   < > 124* 125* 129* 131* 126*  K 2.9*   < > 4.7   < > 5.5* 5.3* 5.9* 4.4 4.4  CL 99   < > 101   < > 96* 99 101 104 100  CO2 20*   < > 19*   < > 18* 19* 19* 21* 11*  GLUCOSE 108*   < > 81   < > 253* 202* 137* 85 144*  BUN <5*   < > 6*   < > 8 10 12 9 8   CREATININE 0.54   < > 0.61   < > 0.74 0.89 1.22* 0.95 0.80  CALCIUM 7.5*   < > 7.9*   < > 7.6* 7.6* 7.8* 7.4* 7.4*  MG 1.2*  --  2.0  --   --   --   --   --   --    < > = values in this interval not displayed.   GFR: Estimated Creatinine Clearance: 49.1 mL/min (by C-G formula based on SCr of 0.8 mg/dL). Liver Function Tests: Recent Labs  Lab 03/12/20 1846 03/16/20 1105  AST 21 19  ALT 22 15  ALKPHOS 150* 101  BILITOT 0.5 1.4*  PROT 5.7* 5.1*  ALBUMIN 2.8* 2.4*   Recent Labs  Lab 03/12/20 1846  LIPASE 28   No results for input(s): AMMONIA in the last 168 hours. Coagulation Profile: Recent Labs  Lab 03/12/20 2010  INR 1.0   Cardiac Enzymes: No results for input(s): CKTOTAL, CKMB, CKMBINDEX, TROPONINI in the last 168 hours. BNP (last 3 results) No results for input(s): PROBNP in the last 8760 hours. HbA1C: No results for input(s): HGBA1C in the last 72 hours. CBG: Recent Labs  Lab 03/15/20 1148 03/15/20 1642 03/15/20 2133 03/16/20 0740 03/16/20 1120  GLUCAP 70 85 102* 137* 145*   Lipid Profile: No results for input(s): CHOL, HDL, LDLCALC, TRIG, CHOLHDL, LDLDIRECT in the last 72 hours. Thyroid Function Tests: Recent Labs    03/16/20 0706   TSH 2.729   Anemia Panel: No results for input(s): VITAMINB12, FOLATE, FERRITIN, TIBC, IRON, RETICCTPCT in the last 72 hours. Urine analysis:    Component Value Date/Time   COLORURINE YELLOW 03/13/2020 [place] 03/13/2020 1112   LABSPEC 1.013 03/13/2020 1112   PHURINE 5.0 03/13/2020 1112   GLUCOSEU 50 (  A) 03/13/2020 1112   GLUCOSEU NEGATIVE 12/28/2013 1036   HGBUR NEGATIVE 03/13/2020 1112   HGBUR trace-intact 06/29/2007 1304   BILIRUBINUR NEGATIVE 03/13/2020 1112   BILIRUBINUR negative 01/19/2017 1049   KETONESUR 5 (A) 03/13/2020 1112   PROTEINUR NEGATIVE 03/13/2020 1112   UROBILINOGEN 0.2 01/19/2017 1049   UROBILINOGEN 1.0 03/25/2015 1755   NITRITE NEGATIVE 03/13/2020 1112   LEUKOCYTESUR NEGATIVE 03/13/2020 1112   Sepsis Labs: @LABRCNTIP (procalcitonin:4,lacticidven:4)  ) Recent Results (from the past 240 hour(s))  Blood Culture (routine x 2)     Status: None (Preliminary result)   Collection Time: 03/12/20  8:00 PM   Specimen: BLOOD  Result Value Ref Range Status   Specimen Description   Final    BLOOD RIGHT ANTECUBITAL Performed at [facility], [place] [address], [place], [place] [address]    Special Requests   Final    BOTTLES DRAWN AEROBIC AND ANAEROBIC Blood Culture adequate volume Performed at [facility] [address] [place], [place] [address]    Culture   Final    NO GROWTH 4 DAYS Performed at [facility], [place] [address], [place], [place] [address]    Report Status PENDING  Incomplete  Blood Culture (routine x 2)     Status: None (Preliminary result)   Collection Time: 03/12/20  8:10 PM   Specimen: BLOOD RIGHT FOREARM  Result Value Ref Range Status   Specimen Description   Final    BLOOD RIGHT FOREARM Performed at [facility] [address], [place], [place] [address]    Special Requests   Final    BOTTLES DRAWN AEROBIC AND ANAEROBIC Blood Culture adequate  volume Performed at [facility] [address], [place], [place] [address]    Culture   Final    NO GROWTH 4 DAYS Performed at [facility], [place] [address] [place], [place] [address]    Report Status PENDING  Incomplete  Respiratory Panel by RT PCR (Flu A&B, Covid) - Nasopharyngeal Swab     Status: None   Collection Time: 03/12/20  8:42 PM   Specimen: Nasopharyngeal Swab  Result Value Ref Range Status   SARS Coronavirus 2 by RT PCR NEGATIVE NEGATIVE Final    Comment: (NOTE) SARS-CoV-2 target nucleic acids are NOT DETECTED.  The SARS-CoV-2 RNA is generally detectable in upper respiratoy specimens during the acute phase of infection. The lowest concentration of SARS-CoV-2 viral copies this assay can detect is 131 copies/mL. A negative result does not preclude SARS-Cov-2 infection and should not be used as the sole basis for treatment or other patient management decisions. A negative result [place] occur with  improper specimen collection/handling, submission of specimen other than nasopharyngeal swab, presence of viral mutation(s) within the areas targeted by this assay, and inadequate number of viral copies (<131 copies/mL). A negative result must be combined with clinical observations, patient history, and epidemiological information. The expected result is Negative.  Fact Sheet for Patients:  PinkCheek.be  Fact Sheet for Healthcare Providers:  GravelBags.it  This test is no t yet approved or cleared by the [place] FDA and  has been authorized for detection and/or diagnosis of SARS-CoV-2 by FDA under an Emergency Use Authorization (EUA). This EUA will remain  in effect (meaning this test can be used) for the duration of the COVID-19 declaration under Section 564(b)(1) of the Act, 21 U.S.C. section 360bbb-3(b)(1), unless the authorization is terminated or revoked sooner.  Influenza A  by PCR NEGATIVE NEGATIVE Final   Influenza B by PCR NEGATIVE NEGATIVE Final    Comment: (NOTE) The Xpert Xpress SARS-CoV-2/FLU/RSV assay is intended as an aid in  the diagnosis of influenza from Nasopharyngeal swab specimens and  should not be used as a sole basis for treatment. Nasal washings and  aspirates are unacceptable for Xpert Xpress SARS-CoV-2/FLU/RSV  testing.  Fact Sheet for Patients: PinkCheek.be  Fact Sheet for Healthcare Providers: GravelBags.it  This test is not yet approved or cleared by the [place] FDA and  has been authorized for detection and/or diagnosis of SARS-CoV-2 by  FDA under an Emergency Use Authorization (EUA). This EUA will remain  in effect (meaning this test can be used) for the duration of the  Covid-19 declaration under Section 564(b)(1) of the Act, 21  U.S.C. section 360bbb-3(b)(1), unless the authorization is  terminated or revoked. Performed at [facility], [place] [address], [place], [place] [address]   Urine culture     Status: None   Collection Time: 03/13/20 11:12 AM   Specimen: In/Out Cath Urine  Result Value Ref Range Status   Specimen Description   Final    IN/OUT CATH URINE Performed at [facility] [address] [place], [place] [address]    Special Requests   Final    NONE Performed at [facility], [place] [address], [place], [place] [address]    Culture   Final    NO GROWTH Performed at [facility], [place] [address], [place],  [address]    Report Status 03/14/2020 FINAL  Final      Studies: No results found.  Scheduled Meds: . acetaminophen  650 mg Oral Q6H  . Chlorhexidine Gluconate Cloth  6 each Topical Daily  . enoxaparin (LOVENOX) injection  40 mg Subcutaneous Q24H  . fluconazole  150 mg Oral Once  . ibuprofen  400 mg Oral Q6H  . insulin glargine  10 Units Subcutaneous QHS  .  levothyroxine  75 mcg Oral QAC breakfast  . metoCLOPramide  5 mg Oral TID AC  . phenytoin  100 mg Oral Daily   And  . phenytoin  50 mg Oral Daily  . phenytoin  200 mg Oral QHS  . primidone  150 mg Oral Daily   And  . primidone  100 mg Oral QHS  . vitamin B-1  250 mg Oral QHS  . trandolapril  2 mg Oral QHS   And  . verapamil  240 mg Oral QHS    Continuous Infusions: . sodium chloride 100 mL/hr at 03/15/20 2315     LOS: 4 days     Kayleen Memos, MD [facility] Pager [phone]  If 7PM-7AM, please contact night-coverage [email or web address] Password [facility] 03/16/2020, 12:48 PM

## 2020-03-17 DIAGNOSIS — R112 Nausea with vomiting, unspecified: Secondary | ICD-10-CM | POA: Diagnosis not present

## 2020-03-17 LAB — CULTURE, BLOOD (ROUTINE X 2)
Culture: NO GROWTH
Culture: NO GROWTH
Special Requests: ADEQUATE
Special Requests: ADEQUATE

## 2020-03-17 LAB — SODIUM
Sodium: 129 mmol/L — ABNORMAL LOW (ref 135–145)
Sodium: 127 mmol/L — ABNORMAL LOW (ref 135–145)

## 2020-03-17 LAB — COMPREHENSIVE METABOLIC PANEL
ALT: 11 U/L (ref 0–44)
AST: 12 U/L — ABNORMAL LOW (ref 15–41)
Albumin: 2.3 g/dL — ABNORMAL LOW (ref 3.5–5.0)
Alkaline Phosphatase: 91 U/L (ref 38–126)
Anion gap: 12 (ref 5–15)
BUN: 5 mg/dL — ABNORMAL LOW (ref 8–23)
CO2: 11 mmol/L — ABNORMAL LOW (ref 22–32)
Calcium: 7.6 mg/dL — ABNORMAL LOW (ref 8.9–10.3)
Chloride: 103 mmol/L (ref 98–111)
Creatinine, Ser: 0.8 mg/dL (ref 0.44–1.00)
GFR, Estimated: >60 mL/min (ref >60)
Glucose, Bld: 168 mg/dL — ABNORMAL HIGH (ref 70–99)
Potassium: 3.7 mmol/L (ref 3.5–5.1)
Sodium: 126 mmol/L — ABNORMAL LOW (ref 135–145)
Total Bilirubin: 1.4 mg/dL — ABNORMAL HIGH (ref 0.3–1.2)
Total Protein: 5 g/dL — ABNORMAL LOW (ref 6.5–8.1)

## 2020-03-17 LAB — GLUCOSE, CAPILLARY
Glucose-Capillary: 127 mg/dL — ABNORMAL HIGH (ref 70–99)
Glucose-Capillary: 144 mg/dL — ABNORMAL HIGH (ref 70–99)
Glucose-Capillary: 179 mg/dL — ABNORMAL HIGH (ref 70–99)
Glucose-Capillary: 232 mg/dL — ABNORMAL HIGH (ref 70–99)

## 2020-03-17 MED ORDER — CALCIUM GLUCONATE-NACL 2-0.675 GM/100ML-% IV SOLN
2.0000 g | Freq: Once | INTRAVENOUS | Status: DC
  Filled 2020-03-17 (×2): qty 100

## 2020-03-17 NOTE — Progress Notes (Signed)
Subjective No recorded episodes of emesis. Had mild nausea yesterday. Passing flatus and having plenty of good BMs. Objective: Vital signs in last 24 hours: Temp:  [97.9 F (36.6 C)-99 F (37.2 C)] 99 F (37.2 C) (11/14 0628) Pulse Rate:  [86-101] 86 (11/14 0628) Resp:  [16-20] 20 (11/14 0628) BP: (136-168)/(60-81) 144/68 (11/14 0628) SpO2:  [94 %-99 %] 96 % (11/14 0628) Last BM Date: 03/16/20  Intake/Output from previous day: 11/13 0701 - 11/14 0700 In: -  Out: 450 [Urine:450] Intake/Output this shift: No intake/output data recorded.  Gen: NAD, comfortable, mentation appropriate - A&O x3 CV: RRR Pulm: Normal work of breathing Abd: Soft, mild/mod tenderness in RLQ; no left sided tenderness; no rebound/guarding. Incisions c/d/i without erythema or drainage. Ext: SCDs in place  Lab Results: CBC  Recent Labs    03/15/20 0513 03/16/20 1105  WBC 7.0 9.7  HGB 10.4* 10.3*  HCT 32.1* 32.2*  PLT 322 389   BMET Recent Labs    03/15/20 0513 03/15/20 0513 03/16/20 1105 03/17/20 0604  NA 131*   < > 126* 129*  K 4.4  --  4.4  --   CL 104  --  100  --   CO2 21*  --  11*  --   GLUCOSE 85  --  144*  --   BUN 9  --  8  --   CREATININE 0.95  --  0.80  --   CALCIUM 7.4*  --  7.4*  --    < > = values in this interval not displayed.   PT/INR No results for input(s): LABPROT, INR in the last 72 hours. ABG No results for input(s): PHART, HCO3 in the last 72 hours.  Invalid input(s): PCO2, PO2  Studies/Results:  Anti-infectives: Anti-infectives (From admission, onward)   Start     Dose/Rate Route Frequency Ordered Stop   03/14/20 1630  fluconazole (DIFLUCAN) tablet 150 mg        150 mg Oral  Once 03/14/20 1541     03/13/20 2200  meropenem (MERREM) 1 g in sodium chloride 0.9 % 100 mL IVPB  Status:  Discontinued        1 g 200 mL/hr over 30 Minutes Intravenous Every 12 hours 03/13/20 1015 03/15/20 0923   03/13/20 0915  meropenem (MERREM) 1 g in sodium chloride 0.9 % 100  mL IVPB  Status:  Discontinued        1 g 200 mL/hr over 30 Minutes Intravenous Every 8 hours 03/13/20 0854 03/13/20 1015   03/12/20 2200  meropenem (MERREM) 1 g in sodium chloride 0.9 % 100 mL IVPB        1 g 200 mL/hr over 30 Minutes Intravenous  Once 03/12/20 2150 03/12/20 2355   03/12/20 1945  metroNIDAZOLE (FLAGYL) IVPB 500 mg        500 mg 100 mL/hr over 60 Minutes Intravenous  Once 03/12/20 1936 03/12/20 2133   03/12/20 1945  vancomycin (VANCOCIN) IVPB 1000 mg/200 mL premix        1,000 mg 200 mL/hr over 60 Minutes Intravenous  Once 03/12/20 1936 03/12/20 2133       Assessment/Plan: Patient Active Problem List   Diagnosis Date Noted  . Nausea & vomiting 03/12/2020  . Cancer of ascending colon pT3pN0 (0/12 LN) s/p lap right colectomy 03/06/2020 03/09/2020  . History of multiple allergies 03/09/2020  . S/P right hemicolectomy 03/06/2020  . Closed fracture of shaft of tibia 02/12/2020  . Gait abnormality 12/04/2019  .  Impaired left ventricular function 09/05/2019  . Hypokalemia   . Effusion of left knee joint   . Hyponatremia   . Left patella fracture 10/26/2018  . Fracture of patella 10/26/2018  . Acquired hypothyroidism 08/25/2017  . 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 Hammer toe 08/25/2017  . History of breast cancer 08/25/2017  . Nontoxic thyroid nodule 08/25/2017  . Osteopetrosis 08/25/2017  . Dysuria 01/19/2017  . Vitamin D deficiency 11/18/2016  . Tremor, essential 08/18/2016  . Convulsions/seizures ([place]) 10/16/2014  . Brain tumor (benign) ([place]) 10/18/2013  . Subdural hemorrhage ([place]) 10/09/2013  . Anxiety 10/09/2013  . Compression fracture of T12 vertebra ([place]) 10/09/2013  . Nontoxic multinodular goiter 09/05/2013  . Syncope 08/27/2013  . Carotid artery disease ([place]) 08/27/2013  . Abnormal thyroid ultrasound 08/27/2013  . Cancer of central portion of female breast ([place]) 12/30/2012  . Osteoporosis 03/06/2010  . ASTHMA 03/05/2009  .  Asthma 03/05/2009  . IRRITABLE BOWEL SYNDROME  diarrhea type 03/21/2008  . ANEMIA-NOS 12/16/2006  . GERD 12/16/2006  . Insulin-requiring or dependent type II diabetes mellitus ([place]) 10/29/2006  . Mixed hyperlipidemia 10/29/2006  . Essential hypertension 10/29/2006  . Allergic rhinitis, cause unspecified 10/29/2006  . OSTEOARTHRITIS 10/29/2006   -Hgb remains stable -soft diet today -Afebrile, normal wbc, no evident source; abx discontinued -PPx: chemical dvt ppx, hgb stable  -Dispo: requesting [facility] place if able   LOS: 5 days  Rosario Adie, MD  [facility]

## 2020-03-17 NOTE — Progress Notes (Signed)
Pt has refused all early am meds.  Pt was opposed to taking synthroid but through much coaxing pt reluctantly took.

## 2020-03-17 NOTE — Progress Notes (Signed)
Asked pt if she wanted to take medications.  She does not want to take motrin or tylenol.  Pt states "its not doing anything anyway."  The only meds that she is willing to take on this evening is blood pressure and dilantin at this time.

## 2020-03-17 NOTE — NC FL2 (Signed)
[place] LEVEL OF CARE SCREENING TOOL     IDENTIFICATION  Patient Name: [name] Birthdate: January 12, 1930 Sex: female Admission Date (Current Location): 03/12/2020  [facility] and [place] Number:  Herbalist and Address:  [facility],  [place] [address] [place]      Provider Number: [id number]  Attending Physician Name and Address:  Ileana Roup, MD  Relative Name and Phone Number:       Current Level of Care: Hospital Recommended Level of Care: [place] Prior Approval Number:    Date Approved/Denied:   PASRR Number: [id number]  Discharge Plan: SNF    Current Diagnoses: Patient Active Problem List   Diagnosis Date Noted  . Nausea & vomiting 03/12/2020  . Cancer of ascending colon pT3pN0 (0/12 LN) s/p lap right colectomy 03/06/2020 03/09/2020  . History of multiple allergies 03/09/2020  . S/P right hemicolectomy 03/06/2020  . Closed fracture of shaft of tibia 02/12/2020  . Gait abnormality 12/04/2019  . Impaired left ventricular function 09/05/2019  . Hypokalemia   . Effusion of left knee joint   . Hyponatremia   . Left patella fracture 10/26/2018  . Fracture of patella 10/26/2018  . Acquired hypothyroidism 08/25/2017  . 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 Hammer toe 08/25/2017  . History of breast cancer 08/25/2017  . Nontoxic thyroid nodule 08/25/2017  . Osteopetrosis 08/25/2017  . Dysuria 01/19/2017  . Vitamin D deficiency 11/18/2016  . Tremor, essential 08/18/2016  . Convulsions/seizures ([place]) 10/16/2014  . Brain tumor (benign) ([place]) 10/18/2013  . Subdural hemorrhage ([place]) 10/09/2013  . Anxiety 10/09/2013  . Compression fracture of T12 vertebra ([place]) 10/09/2013  . Nontoxic multinodular goiter 09/05/2013  . Syncope 08/27/2013  . Carotid artery disease ([place]) 08/27/2013  . Abnormal thyroid ultrasound 08/27/2013  . Cancer of central portion of female breast ([place])  12/30/2012  . Osteoporosis 03/06/2010  . ASTHMA 03/05/2009  . Asthma 03/05/2009  . IRRITABLE BOWEL SYNDROME  diarrhea type 03/21/2008  . ANEMIA-NOS 12/16/2006  . GERD 12/16/2006  . Insulin-requiring or dependent type II diabetes mellitus ([place]) 10/29/2006  . Mixed hyperlipidemia 10/29/2006  . Essential hypertension 10/29/2006  . Allergic rhinitis, cause unspecified 10/29/2006  . OSTEOARTHRITIS 10/29/2006    Orientation RESPIRATION BLADDER Height & Weight     Self, Time, Situation, Place  Normal Incontinent Weight: 143 lb 4.8 oz (65 kg) Height:  5' 7.5" (171.5 cm)  BEHAVIORAL SYMPTOMS/MOOD NEUROLOGICAL BOWEL NUTRITION STATUS      Continent Diet (see dc summary)  AMBULATORY STATUS COMMUNICATION OF NEEDS Skin   Extensive Assist Verbally PU Stage and Appropriate Care (sacrum)                       Personal Care Assistance Level of Assistance  Bathing, Feeding, Dressing Bathing Assistance: Limited assistance Feeding assistance: Independent Dressing Assistance: Limited assistance     Functional Limitations Info  Sight, Hearing, Speech Sight Info: Adequate Hearing Info: Adequate Speech Info: Adequate    SPECIAL CARE FACTORS FREQUENCY  PT (By licensed PT), OT (By licensed OT)     PT Frequency: 5x week OT Frequency: 3x week            Contractures Contractures Info: Not present    Additional Factors Info  Code Status, Allergies, Psychotropic Code Status Info: Full Allergies Info: Bystolic Nebivolol HclCholestyramine Hydrazine Yellow TartrazineMorphine Niacin Niaspan Niacin ErNorvasc Amlodipine Besylate Optivar Azelastine Hcl Repaglinide Sular Nisoldipine ErTegretol Carbamazepine  Telmisartan Amoxicillin-pot Clavulanate Cefdinir Ciprofloxacin Hcl Clonidine Clonidine Hydrochloride Codeine Ezetimibe Hydroxychloroquine Naproxen Sulfa Antibiotics Ziac Bisoprolol-hydrochlorothiazide Azelastine Elavil AmitriptylineEmpagliflozin Hydralazine Iodine Kenalog TriamcinoloneKeppra  Levetiracetam Lamictal Lamotrigine Lyrica Pregabalin Pseudoephedrine Topamax TopiramateAce Inhibitors Actonel Risedronate SodiumAmlodipine Besylate Aspirin [place] Candesartan Bextra Valdecoxib Bisoprolol-hydrochlorothiazide Candesartan Cilexetil Cefadroxil Celecoxib Hydrocodone Hydrocodone-acetaminophen Iodinated Diagnostic Agents Meloxicam Methylprednisolone Sodium Succinate Nabumetone Penicillins Pseudoephedrine-guaifenesin Risedronate Sodium Rofecoxib Ru-tuss Chlorphen-pse-atrop-hyos-scopSulfonamide Derivatives [place]ur Sulfur Telithromycin Terfenadine Trandolapril-verapamil Hcl Er Trandolapril-verapamil Hcl Er Valium Diazepam Psychotropic Info: Xanax, Lexapro         Current Medications (03/17/2020):  This is the current hospital active medication list Current Facility-Administered Medications  Medication Dose Route Frequency Provider Last Rate Last Admin  . 0.9 %  sodium chloride infusion   Intravenous Continuous Greer Pickerel, MD 100 mL/hr at 03/17/20 0434 New Bag at 03/17/20 0434  . acetaminophen (TYLENOL) tablet 650 mg  650 mg Oral Q6H Ileana Roup, MD   650 mg at 03/15/20 2325  . calcium gluconate 2 g/ 100 mL sodium chloride IVPB  2 g Intravenous Once Kayleen Memos, DO 100 mL/hr at 03/17/20 7616 Restarted at 03/17/20 0708  . diclofenac Sodium (VOLTAREN) 1 % topical gel 2 g  2 g Topical QID PRN Greer Pickerel, MD      . diphenhydrAMINE (BENADRYL) 12.5 MG/5ML elixir 12.5 mg  12.5 mg Oral Q6H PRN Greer Pickerel, MD       Or  . diphenhydrAMINE (BENADRYL) injection 12.5 mg  12.5 mg Intravenous Q6H PRN Greer Pickerel, MD   12.5 mg at 03/16/20 2208  . enoxaparin (LOVENOX) injection 40 mg  40 mg Subcutaneous [id number] Leighton Ruff, MD   40 mg at 03/17/20 0932  . fluconazole (DIFLUCAN) tablet 150 mg  150 mg Oral Once Alix, DO      . HYDROmorphone (DILAUDID) injection 0.5 mg  0.5 mg Intravenous Q3H PRN Ileana Roup, MD   0.5 mg at 03/17/20 0530  . ibuprofen (ADVIL) tablet 400 mg   400 mg Oral Q6H Ileana Roup, MD   400 mg at 03/17/20 0933  . insulin glargine (LANTUS) injection 10 Units  10 Units Subcutaneous QHS Harold Hedge, MD   10 Units at 03/14/20 2228  . levothyroxine (SYNTHROID) tablet 75 mcg  75 mcg Oral QAC breakfast Greer Pickerel, MD   75 mcg at 03/17/20 0555  . metoCLOPramide (REGLAN) 10 MG/10ML solution 5 mg  5 mg Oral TID AC Green, Terri L, RPH   5 mg at 03/17/20 0931  . ondansetron (ZOFRAN-ODT) disintegrating tablet 4 mg  4 mg Oral Q6H PRN Greer Pickerel, MD       Or  . ondansetron [facility]) injection 4 mg  4 mg Intravenous Q6H PRN Greer Pickerel, MD   4 mg at 03/17/20 0225  . oxyCODONE (Oxy IR/ROXICODONE) immediate release tablet 5 mg  5 mg Oral Q6H PRN Ileana Roup, MD   5 mg at 03/15/20 2310  . phenytoin (DILANTIN) ER capsule 100 mg  100 mg Oral Daily Minda Ditto, RPH   100 mg at 03/17/20 1062   And  . phenytoin (DILANTIN) chewable tablet 50 mg  50 mg Oral Daily Minda Ditto, RPH   50 mg at 03/17/20 0934  . phenytoin (DILANTIN) ER capsule 200 mg  200 mg Oral QHS Minda Ditto, RPH   200 mg at 03/16/20 2209  . primidone (MYSOLINE) tablet 150 mg  150 mg Oral Daily Minda Ditto, RPH   150 mg at 03/17/20 6948  And  . primidone (MYSOLINE) tablet 100 mg  100 mg Oral QHS Green, Terri L, RPH   100 mg at 03/15/20 2327  . promethazine (PHENERGAN) injection 6.25 mg  6.25 mg Intravenous Q6H PRN Greer Pickerel, MD   6.25 mg at 03/17/20 0532  . simethicone (MYLICON) chewable tablet 40 mg  40 mg Oral Q6H PRN Greer Pickerel, MD   40 mg at 03/15/20 1304  . thiamine tablet 250 mg  250 mg Oral QHS Ileana Roup, MD   250 mg at 03/15/20 2306  . trandolapril (MAVIK) tablet 2 mg  2 mg Oral QHS Minda Ditto, RPH   2 mg at 03/16/20 2208   And  . verapamil (CALAN-SR) CR tablet 240 mg  240 mg Oral QHS Minda Ditto, RPH   240 mg at 03/16/20 2200     Discharge Medications: Please see discharge summary for a list of discharge medications.  Relevant  Imaging Results:  Relevant Lab Results:   Additional Information SSN: Philip, LCSW

## 2020-03-17 NOTE — Plan of Care (Signed)

## 2020-03-17 NOTE — Progress Notes (Signed)
PROGRESS NOTE  [name] XTG:[id number] DOB: [birth year] DOA: 03/12/2020 PCP: Bartholome Bill, MD  HPI/Recap of past 24 hours: [name] is an 86 y.o. female with a past medical history of chronic hyponatremia, IDDM2, hypothyroidism, hypertension, hyperlipidemia, left tib/fib fracture, breast cancer, brain tumor s/p surgery complicated with seizures, colon cancer, recent hospitalization from 11/3-11/9 for laparoscopic right hemicolectomy with extensive adhesiolysis by Dr. Dema Severin on 11/3 for colon cancer complicated by postop bleeding requiring blood transfusions.  Was discharged the day prior to this presentation but returns to the ED due to persistent generalized weakness, nausea and abdominal pain.  CT imaging was performed which showed mild small bowel dilatation and fluid within the abdomen consistent with blood.  CXR was unremarkable and initial lactate was elevated but repeat was normal.  Given her multiple medical comorbidities, [facility], Hospitalist team, was consulted.  03/17/20: Abdominal discomfort is improved today.  She is receptive to getting out of bed with assistance.  Assessment/Plan: Active Problems:   Nausea & vomiting  Colon cancer s/p right hemicolectomy on 03/06/2020 by general surgery, Dr. Dema Severin 1. Management per primary team  Hypovolemic hyponatremia Serum sodium 126, unclear if blood was drawn from same side as she is getting IV fluid Repeating serum sodium ay 1500       Will continue NS IV fluid and repeat serum Na+ this afternoon.  Type 2 diabetes, well controlled. Hemoglobin A1c 8.6 on 03/04/2020. CBG stable Continue Lantus 10 units daily Continue to monitor CBGs  Improving Abdominal discomfort No evidence of systemic infection or active infective process Afebrile with no leukocytosis Lactic acid, procalcitonin negative Mobilize Encourage po intake  Acute blood loss anemia post surgery, stable Hemoglobin is stable at 10.3 from 10.4 from 10.7 No overt  bleeding BUN low, no evidence of bleeding  Resolved hyperkalemia Potassium downtrending, 3.7 Initial K+ 5.9  Seizure disorder Continue home AEDs Last dilantin level 9.4 on 08/08/19 Seizure precautions in place Will need to follow up with her neurologist outpatient  Essential hypertension Continue home oral antihypertensives Continue to monitor vital signs  Hypothyroidism TSH normal 2.729 Continue home Synthroid  History of brain tumor s/p surgery  Ambulatory dysfunction PT OT rec SNF OOB to chair every shift   Thank you for this consultation.  [facility] hospitalist team will follow the patient with you.     Objective: Vitals:   03/16/20 1250 03/16/20 2034 03/17/20 0628 03/17/20 1342  BP: (!) 161/81 (!) 168/74 (!) 144/68 (!) 153/82  Pulse: (!) 101 96 86 (!) 101  Resp: 18 18 20 18   Temp: 98.8 F (37.1 C) 98.6 F (37 C) 99 F (37.2 C) 98.5 F (36.9 C)  TempSrc:  Oral    SpO2: 99% 95% 96% 97%  Weight:      Height:        Intake/Output Summary (Last 24 hours) at 03/17/2020 1451 Last data filed at 03/17/2020 1106 Gross per 24 hour  Intake --  Output 950 ml  Net -950 ml   Filed Weights   03/12/20 2245  Weight: 65 kg    Exam:  . General: 86 y.o. year-old female WD WN no acute distress.  Alert and interactive . Cardiovascular: RRR no rubs or gallops . Respiratory: CTA no wheezes or rales . Abdomen: BS present no significant tenderness as previously, soft. . Musculoskeletal: No LE edema bilaterally . Psychiatry: Mood is appropriate  Data Reviewed: CBC: Recent Labs  Lab 03/12/20 1846 03/12/20 1846 03/13/20 0525 03/13/20 0525 03/13/20 2050  03/14/20 0239 03/14/20 0717 03/15/20 0513 03/16/20 1105  WBC 23.0*  --  19.1*  --   --   --  12.1* 7.0 9.7  NEUTROABS 18.9*  --   --   --   --   --  9.0* 4.8 7.8*  HGB 9.0*   < > 7.7*   < > 10.8* 9.9* 10.7* 10.4* 10.3*  HCT 28.2*   < > 25.2*   < > 32.0* 29.7* 31.7* 32.1* 32.2*  MCV 79.7*  --  82.9  --   --    --  84.5 85.4 86.3  PLT 484*  --  422*  --   --   --  331 322 389   < > = values in this interval not displayed.   Basic Metabolic Panel: Recent Labs  Lab 03/11/20 0458 03/11/20 0458 03/12/20 0524 03/12/20 1846 03/13/20 0525 03/13/20 0525 03/13/20 2050 03/15/20 0513 03/16/20 1105 03/17/20 0604 03/17/20 1220  NA 130*   < > 130*   < > 125*   < > 129* 131* 126* 129* 126*  K 2.9*   < > 4.7   < > 5.3*  --  5.9* 4.4 4.4  --  3.7  CL 99   < > 101   < > 99  --  101 104 100  --  103  CO2 20*   < > 19*   < > 19*  --  19* 21* 11*  --  11*  GLUCOSE 108*   < > 81   < > 202*  --  137* 85 144*  --  168*  BUN <5*   < > 6*   < > 10  --  12 9 8   --  5*  CREATININE 0.54   < > 0.61   < > 0.89  --  1.22* 0.95 0.80  --  0.80  CALCIUM 7.5*   < > 7.9*   < > 7.6*  --  7.8* 7.4* 7.4*  --  7.6*  MG 1.2*  --  2.0  --   --   --   --   --   --   --   --    < > = values in this interval not displayed.   GFR: Estimated Creatinine Clearance: 48.2 mL/min (by C-G formula based on SCr of 0.8 mg/dL). Liver Function Tests: Recent Labs  Lab 03/12/20 1846 03/16/20 1105 03/17/20 1220  AST 21 19 12*  ALT 22 15 11   ALKPHOS 150* 101 91  BILITOT 0.5 1.4* 1.4*  PROT 5.7* 5.1* 5.0*  ALBUMIN 2.8* 2.4* 2.3*   Recent Labs  Lab 03/12/20 1846  LIPASE 28   No results for input(s): AMMONIA in the last 168 hours. Coagulation Profile: Recent Labs  Lab 03/12/20 2010  INR 1.0   Cardiac Enzymes: No results for input(s): CKTOTAL, CKMB, CKMBINDEX, TROPONINI in the last 168 hours. BNP (last 3 results) No results for input(s): PROBNP in the last 8760 hours. HbA1C: No results for input(s): HGBA1C in the last 72 hours. CBG: Recent Labs  Lab 03/16/20 1120 03/16/20 1600 03/16/20 2036 03/17/20 0732 03/17/20 1128  GLUCAP 145* 129* 125* 127* 144*   Lipid Profile: No results for input(s): CHOL, HDL, LDLCALC, TRIG, CHOLHDL, LDLDIRECT in the last 72 hours. Thyroid Function Tests: Recent Labs    03/16/20 0706   TSH 2.729   Anemia Panel: No results for input(s): VITAMINB12, FOLATE, FERRITIN, TIBC, IRON, RETICCTPCT in the last 72 hours. Urine analysis:  Component Value Date/Time   COLORURINE YELLOW 03/13/2020 1112   APPEARANCEUR CLEAR 03/13/2020 1112   LABSPEC 1.013 03/13/2020 1112   PHURINE 5.0 03/13/2020 1112   GLUCOSEU 50 (A) 03/13/2020 1112   GLUCOSEU NEGATIVE 12/28/2013 1036   HGBUR NEGATIVE 03/13/2020 1112   HGBUR trace-intact 06/29/2007 1304   BILIRUBINUR NEGATIVE 03/13/2020 1112   BILIRUBINUR negative 01/19/2017 1049   KETONESUR 5 (A) 03/13/2020 1112   PROTEINUR NEGATIVE 03/13/2020 1112   UROBILINOGEN 0.2 01/19/2017 1049   UROBILINOGEN 1.0 03/25/2015 1755   NITRITE NEGATIVE 03/13/2020 1112   LEUKOCYTESUR NEGATIVE 03/13/2020 1112   Sepsis Labs: @LABRCNTIP (procalcitonin:4,lacticidven:4)  ) Recent Results (from the past 240 hour(s))  Blood Culture (routine x 2)     Status: None   Collection Time: 03/12/20  8:00 PM   Specimen: BLOOD  Result Value Ref Range Status   Specimen Description   Final    BLOOD RIGHT ANTECUBITAL Performed at [facility] [address] [place], [place] [address]    Special Requests   Final    BOTTLES DRAWN AEROBIC AND ANAEROBIC Blood Culture adequate volume Performed at [facility] [address] [place], [place] [address]    Culture   Final    NO GROWTH 5 DAYS Performed at [facility], [place] [address] [place], [place] [address]    Report Status 03/17/2020 FINAL  Final  Blood Culture (routine x 2)     Status: None   Collection Time: 03/12/20  8:10 PM   Specimen: BLOOD RIGHT FOREARM  Result Value Ref Range Status   Specimen Description   Final    BLOOD RIGHT FOREARM Performed at [facility] [address] [place], [place] [address]    Special Requests   Final    BOTTLES DRAWN AEROBIC AND ANAEROBIC Blood Culture adequate volume Performed at [facility] [address] [place], [place] [address]    Culture   Final    NO GROWTH 5 DAYS Performed at [facility], [place] [address], [place], [place] [address]    Report Status 03/17/2020 FINAL  Final  Respiratory Panel by RT PCR (Flu A&B, Covid) - Nasopharyngeal Swab     Status: None   Collection Time: 03/12/20  8:42 PM   Specimen: Nasopharyngeal Swab  Result Value Ref Range Status   SARS Coronavirus 2 by RT PCR NEGATIVE NEGATIVE Final    Comment: (NOTE) SARS-CoV-2 target nucleic acids are NOT DETECTED.  The SARS-CoV-2 RNA is generally detectable in upper respiratoy specimens during the acute phase of infection. The lowest concentration of SARS-CoV-2 viral copies this assay can detect is 131 copies/mL. A negative result does not preclude SARS-Cov-2 infection and should not be used as the sole basis for treatment or other patient management decisions. A negative result Uher occur with  improper specimen collection/handling, submission of specimen other than nasopharyngeal swab, presence of viral mutation(s) within the areas targeted by this assay, and inadequate number of viral copies (<131 copies/mL). A negative result must be combined with clinical observations, patient history, and epidemiological information. The expected result is Negative.  Fact Sheet for Patients:  PinkCheek.be  Fact Sheet for Healthcare Providers:  GravelBags.it  This test is no t yet approved or cleared by the [place] FDA and  has been authorized for detection and/or diagnosis of SARS-CoV-2 by FDA under an Emergency Use Authorization (EUA). This EUA will remain  in effect (meaning this test can be used)  for the duration of the COVID-19 declaration under Section 564(b)(1) of the Act, 21 U.S.C. section 360bbb-3(b)(1), unless the authorization is terminated or revoked sooner.     Influenza A by PCR NEGATIVE NEGATIVE Final    Influenza B by PCR NEGATIVE NEGATIVE Final    Comment: (NOTE) The Xpert Xpress SARS-CoV-2/FLU/RSV assay is intended as an aid in  the diagnosis of influenza from Nasopharyngeal swab specimens and  should not be used as a sole basis for treatment. Nasal washings and  aspirates are unacceptable for Xpert Xpress SARS-CoV-2/FLU/RSV  testing.  Fact Sheet for Patients: PinkCheek.be  Fact Sheet for Healthcare Providers: GravelBags.it  This test is not yet approved or cleared by the [place] FDA and  has been authorized for detection and/or diagnosis of SARS-CoV-2 by  FDA under an Emergency Use Authorization (EUA). This EUA will remain  in effect (meaning this test can be used) for the duration of the  Covid-19 declaration under Section 564(b)(1) of the Act, 21  U.S.C. section 360bbb-3(b)(1), unless the authorization is  terminated or revoked. Performed at [facility], [place] [address] [place], [place] [address]   Urine culture     Status: None   Collection Time: 03/13/20 11:12 AM   Specimen: In/Out Cath Urine  Result Value Ref Range Status   Specimen Description   Final    IN/OUT CATH URINE Performed at [facility] [address], [place], [place] [address]    Special Requests   Final    NONE Performed at [facility], [place] [address] [place], [place] [address]    Culture   Final    NO GROWTH Performed at [facility], [place] [address], [place],  [address]    Report Status 03/14/2020 FINAL  Final      Studies: No results found.  Scheduled Meds: . acetaminophen  650 mg Oral Q6H  . enoxaparin (LOVENOX) injection  40 mg Subcutaneous Q24H  . fluconazole  150 mg Oral Once  . ibuprofen  400 mg Oral Q6H  . insulin glargine  10 Units Subcutaneous QHS  . levothyroxine  75 mcg Oral QAC breakfast  . metoCLOPramide  5 mg Oral TID AC  . phenytoin  100  mg Oral Daily   And  . phenytoin  50 mg Oral Daily  . phenytoin  200 mg Oral QHS  . primidone  150 mg Oral Daily   And  . primidone  100 mg Oral QHS  . vitamin B-1  250 mg Oral QHS  . trandolapril  2 mg Oral QHS   And  . verapamil  240 mg Oral QHS    Continuous Infusions: . sodium chloride 100 mL/hr at 03/17/20 0434  . calcium gluconate 100 mL/hr at 03/17/20 0708     LOS: 5 days     Kayleen Memos, MD [facility] Pager [phone]  If 7PM-7AM, please contact night-coverage [email or web address] Password [email or web address] 03/17/2020, 2:51 PM

## 2020-03-17 NOTE — TOC Progression Note (Signed)
Transition of Care [facility]) - Progression Note    Patient Details  Name: [name] MRN: [id number] Date of Birth: 1930/02/23  Transition of [facility]) CM/SW Contact  Shade Flood, LCSW Phone Number: 03/17/2020, 1:22 PM  Clinical Narrative:     TOC following. MD note indicated pt agreeable to SNF at dc with preference for [facility]. Met with pt today to review. Pt confirms she is agreeable to [facility] or [facility] in [place]. Will refer as requested. Pt family member arrived at the end of [facility] visit and he was updated. TOC will follow and assist as needed.  Expected Discharge Plan: [place] Barriers to Discharge: Continued Medical Work up  Expected Discharge Plan and Services Expected Discharge Plan: [place] In-house Referral: Clinical Social Work Discharge Planning Services: CM Consult Post Acute Care Choice: [place] arrangements for the past 2 months: Advance Determinants of Health (SDOH) Interventions    Readmission Risk Interventions Readmission Risk Prevention Plan 03/15/2020 03/13/2020  Transportation Screening Complete -  PCP or Specialist Appt within 3-5 Days Complete Complete  HRI or [place] - Complete  Social Work Consult for [place] Planning/Counseling Complete Complete  Palliative Care Screening Complete Complete  Medication Review Press photographer) Complete Complete  Some recent data might be hidden

## 2020-03-17 NOTE — Progress Notes (Signed)
Pt refused evening medications except for dilantin and bp medicine which pt was coaxed to take. Pt stated "I will not be taking any other medications at this time."

## 2020-03-18 ENCOUNTER — Encounter (HOSPITAL_COMMUNITY): Payer: Self-pay

## 2020-03-18 DIAGNOSIS — R112 Nausea with vomiting, unspecified: Secondary | ICD-10-CM | POA: Diagnosis not present

## 2020-03-18 LAB — CBC
HCT: 30.1 % — ABNORMAL LOW (ref 36.0–46.0)
Hemoglobin: 9.9 g/dL — ABNORMAL LOW (ref 12.0–15.0)
MCH: 28 pg (ref 26.0–34.0)
MCHC: 32.9 g/dL (ref 30.0–36.0)
MCV: 85 fL (ref 80.0–100.0)
Platelets: 354 10*3/uL (ref 150–400)
RBC: 3.54 MIL/uL — ABNORMAL LOW (ref 3.87–5.11)
RDW: 20.4 % — ABNORMAL HIGH (ref 11.5–15.5)
WBC: 9.5 10*3/uL (ref 4.0–10.5)
nRBC: 0 % (ref 0.0–0.2)

## 2020-03-18 LAB — BASIC METABOLIC PANEL
Anion gap: 6 (ref 5–15)
Anion gap: 8 (ref 5–15)
BUN: <5 mg/dL — ABNORMAL LOW (ref 8–23)
BUN: <5 mg/dL — ABNORMAL LOW (ref 8–23)
CO2: 18 mmol/L — ABNORMAL LOW (ref 22–32)
CO2: 18 mmol/L — ABNORMAL LOW (ref 22–32)
Calcium: 7.4 mg/dL — ABNORMAL LOW (ref 8.9–10.3)
Calcium: 7.2 mg/dL — ABNORMAL LOW (ref 8.9–10.3)
Chloride: 104 mmol/L (ref 98–111)
Chloride: 102 mmol/L (ref 98–111)
Creatinine, Ser: 0.54 mg/dL (ref 0.44–1.00)
Creatinine, Ser: 0.63 mg/dL (ref 0.44–1.00)
GFR, Estimated: >60 mL/min (ref >60)
GFR, Estimated: >60 mL/min (ref >60)
Glucose, Bld: 153 mg/dL — ABNORMAL HIGH (ref 70–99)
Glucose, Bld: 255 mg/dL — ABNORMAL HIGH (ref 70–99)
Potassium: 3.2 mmol/L — ABNORMAL LOW (ref 3.5–5.1)
Potassium: 3.1 mmol/L — ABNORMAL LOW (ref 3.5–5.1)
Sodium: 128 mmol/L — ABNORMAL LOW (ref 135–145)
Sodium: 128 mmol/L — ABNORMAL LOW (ref 135–145)

## 2020-03-18 LAB — GLUCOSE, CAPILLARY
Glucose-Capillary: 128 mg/dL — ABNORMAL HIGH (ref 70–99)
Glucose-Capillary: 173 mg/dL — ABNORMAL HIGH (ref 70–99)
Glucose-Capillary: 272 mg/dL — ABNORMAL HIGH (ref 70–99)
Glucose-Capillary: 277 mg/dL — ABNORMAL HIGH (ref 70–99)

## 2020-03-18 LAB — MAGNESIUM: Magnesium: 1.1 mg/dL — ABNORMAL LOW (ref 1.7–2.4)

## 2020-03-18 MED ORDER — POTASSIUM CHLORIDE CRYS ER 20 MEQ PO TBCR
40.0000 meq | EXTENDED_RELEASE_TABLET | Freq: Two times a day (BID) | ORAL | Status: AC
  Administered 2020-03-18 (×2): 40 meq via ORAL
  Filled 2020-03-18 (×2): qty 2

## 2020-03-18 MED ORDER — CYANOCOBALAMIN 1000 MCG/ML IJ SOLN: 1000.0000 ug | INTRAMUSCULAR | Status: DC

## 2020-03-18 MED ORDER — VITAMIN D3 25 MCG (1000 UNIT) PO TABS
5000.0000 [IU] | ORAL_TABLET | Freq: Every day | ORAL | Status: DC
  Administered 2020-03-18 – 2020-03-21 (×4): 5000 [IU] via ORAL
  Filled 2020-03-18 (×4): qty 5

## 2020-03-18 MED ORDER — SODIUM CHLORIDE 1 G PO TABS
2.0000 g | ORAL_TABLET | Freq: Two times a day (BID) | ORAL | Status: DC
  Filled 2020-03-18 (×4): qty 2

## 2020-03-18 MED ORDER — ESCITALOPRAM OXALATE 10 MG PO TABS
10.0000 mg | ORAL_TABLET | Freq: Every day | ORAL | Status: DC
  Administered 2020-03-18 – 2020-03-21 (×4): 10 mg via ORAL
  Filled 2020-03-18 (×4): qty 1

## 2020-03-18 MED ORDER — BIOTIN 1000 MCG PO TABS: 1000.0000 ug | ORAL_TABLET | Freq: Every day | ORAL | Status: DC

## 2020-03-18 MED ORDER — GLUCERNA SHAKE PO LIQD
237.0000 mL | Freq: Three times a day (TID) | ORAL | Status: DC
  Administered 2020-03-18 – 2020-03-20 (×4): 237 mL via ORAL
  Filled 2020-03-18 (×11): qty 237

## 2020-03-18 NOTE — Care Management Important Message (Signed)
Important Message  Patient Details IM Letter given to the Patient. Name: [name] MRN: [id number] Date of Birth: Aug 10, 1929   Medicare Important Message Given:  Yes     Kerin Salen 03/18/2020, 11:54 AM

## 2020-03-18 NOTE — Progress Notes (Signed)
   03/17/20 2020  Gastrointestinal  Tenderness Tender;Other (Comment) (Ecchymosis noted left and lateral abd-d/t old blood in abd)  Will continue to monitor for signs of increasing pain not relieved by pain medication and any  new pain in flank area.

## 2020-03-18 NOTE — Progress Notes (Signed)
PROGRESS NOTE  [name] SAY:[id number] DOB: 1929-09-08 DOA: 03/12/2020 PCP: Bartholome Bill, MD  HPI/Recap of past 24 hours: [name] is an 86 y.o. female with a past medical history of chronic hyponatremia, IDDM2, hypothyroidism, hypertension, hyperlipidemia, left tib/fib fracture, breast cancer, brain tumor s/p surgery complicated with seizures, colon cancer, recent hospitalization from 11/3-11/9 for laparoscopic right hemicolectomy with extensive adhesiolysis by Dr. Dema Severin on 11/3 for colon cancer complicated by postop bleeding requiring blood transfusions.  Was discharged the day prior to this presentation but returns to the ED due to persistent generalized weakness, nausea and abdominal pain.  CT imaging was performed which showed mild small bowel dilatation and fluid within the abdomen consistent with blood.  CXR was unremarkable and initial lactate was elevated but repeat was normal.  Given her multiple medical comorbidities, [facility], Hospitalist team, was consulted.  03/18/20:  [name] says she feels better today.  She has some ecchymosis noted on her left abdomen and left flank but she is asymptomatic from it.  Hg 9.9K from 10.3K.  IV fluid dced today.  Serum Na+ 128 and K+ 3.2, replaced.  Started on salt tablet.  Will repeat BMP at 1600.  Assessment/Plan: Active Problems:   Nausea & vomiting  Colon cancer s/p right hemicolectomy on 03/06/2020 by general surgery, Dr. Dema Severin 1. Management per primary team  Hypovolemic hyponatremia   Serum sodium 128   Started on salt tablet.  Will repeat BMP at 1600.  Hypokalemia K+ 3.2 Repleted orally Repeat BMP and mag level at 1600  Type 2 diabetes, well controlled. Hemoglobin A1c 8.6 on 03/04/2020. CBG stable Continue Lantus 10 units daily Continue to monitor CBGs  Improving Abdominal discomfort No evidence of systemic infection or active infective process Afebrile with no leukocytosis Lactic acid, procalcitonin  negative Mobilize Encourage po intake  Acute blood loss anemia post surgery, stable Hemoglobin 9.9 10.3 from 10.4 from 10.7 No overt bleeding  Resolved hyperkalemia, now hypokalemia Potassium downtrending, 3.7>> 3.2 Initial K+ 5.9  Seizure disorder Continue home AEDs Last dilantin level 9.4 on 08/08/19 Seizure precautions in place Will need to follow up with her neurologist outpatient  Essential hypertension Continue home oral antihypertensives Continue to monitor vital signs  Hypothyroidism TSH normal 2.729 Continue home Synthroid  History of brain tumor s/p surgery  Ambulatory dysfunction PT OT rec SNF OOB to chair every shift   Thank you for this consultation.  Our [facility] hospitalist team will follow the patient with you.     Objective: Vitals:   03/17/20 1342 03/17/20 2045 03/18/20 0603 03/18/20 1130  BP: (!) 153/82 (!) 151/83 130/72 135/60  Pulse: (!) 101 (!) 110 88 98  Resp: 18 18 18  (!) 21  Temp: 98.5 F (36.9 C) 97.9 F (36.6 C) 98 F (36.7 C) (!) 97.5 F (36.4 C)  TempSrc:      SpO2: 97% 96% 91% 94%  Weight:      Height:        Intake/Output Summary (Last 24 hours) at 03/18/2020 1605 Last data filed at 03/18/2020 0556 Gross per 24 hour  Intake --  Output 950 ml  Net -950 ml   Filed Weights   03/12/20 2245  Weight: 65 kg    Exam:   . General: 86 y.o. year-old female WD WN NAD A& interactive . Cardiovascular: RRR no rubs or gallops . Respiratory: CTA no wheezes or rales . Abdomen: BS present, soft non tender.  Mild ecchymosis noted left abd and left flank. Marland Kitchen  Musculoskeletal: No LE edema bilaterally. Marland Kitchen Psychiatry: Mood is appropriate.  Data Reviewed: CBC: Recent Labs  Lab 03/12/20 1846 03/12/20 1846 03/13/20 0525 03/13/20 2050 03/14/20 0239 03/14/20 0717 03/15/20 0513 03/16/20 1105 03/18/20 0557  WBC 23.0*   < > 19.1*  --   --  12.1* 7.0 9.7 9.5  NEUTROABS 18.9*  --   --   --   --  9.0* 4.8 7.8*  --   HGB 9.0*   < > 7.7*    < > 9.9* 10.7* 10.4* 10.3* 9.9*  HCT 28.2*   < > 25.2*   < > 29.7* 31.7* 32.1* 32.2* 30.1*  MCV 79.7*   < > 82.9  --   --  84.5 85.4 86.3 85.0  PLT 484*   < > 422*  --   --  331 322 389 354   < > = values in this interval not displayed.   Basic Metabolic Panel: Recent Labs  Lab 03/12/20 0524 03/12/20 1846 03/13/20 2050 03/13/20 2050 03/15/20 0513 03/15/20 0513 03/16/20 1105 03/17/20 0604 03/17/20 1220 03/17/20 1520 03/18/20 0557  NA 130*   < > 129*   < > 131*   < > 126* 129* 126* 127* 128*  K 4.7   < > 5.9*  --  4.4  --  4.4  --  3.7  --  3.2*  CL 101   < > 101  --  104  --  100  --  103  --  104  CO2 19*   < > 19*  --  21*  --  11*  --  11*  --  18*  GLUCOSE 81   < > 137*  --  85  --  144*  --  168*  --  153*  BUN 6*   < > 12  --  9  --  8  --  5*  --  <5*  CREATININE 0.61   < > 1.22*  --  0.95  --  0.80  --  0.80  --  0.54  CALCIUM 7.9*   < > 7.8*  --  7.4*  --  7.4*  --  7.6*  --  7.4*  MG 2.0  --   --   --   --   --   --   --   --   --   --    < > = values in this interval not displayed.   GFR: Estimated Creatinine Clearance: 48.2 mL/min (by C-G formula based on SCr of 0.54 mg/dL). Liver Function Tests: Recent Labs  Lab 03/12/20 1846 03/16/20 1105 03/17/20 1220  AST 21 19 12*  ALT 22 15 11   ALKPHOS 150* 101 91  BILITOT 0.5 1.4* 1.4*  PROT 5.7* 5.1* 5.0*  ALBUMIN 2.8* 2.4* 2.3*   Recent Labs  Lab 03/12/20 1846  LIPASE 28   No results for input(s): AMMONIA in the last 168 hours. Coagulation Profile: Recent Labs  Lab 03/12/20 2010  INR 1.0   Cardiac Enzymes: No results for input(s): CKTOTAL, CKMB, CKMBINDEX, TROPONINI in the last 168 hours. BNP (last 3 results) No results for input(s): PROBNP in the last 8760 hours. HbA1C: No results for input(s): HGBA1C in the last 72 hours. CBG: Recent Labs  Lab 03/17/20 1128 03/17/20 1604 03/17/20 2045 03/18/20 0742 03/18/20 1127  GLUCAP 144* 179* 232* 128* 173*   Lipid Profile: No results for input(s):  CHOL, HDL, LDLCALC, TRIG, CHOLHDL, LDLDIRECT in the last 72 hours.  Thyroid Function Tests: Recent Labs    03/16/20 0706  TSH 2.729   Anemia Panel: No results for input(s): VITAMINB12, FOLATE, FERRITIN, TIBC, IRON, RETICCTPCT in the last 72 hours. Urine analysis:    Component Value Date/Time   COLORURINE YELLOW 03/13/2020 1112   APPEARANCEUR CLEAR 03/13/2020 1112   LABSPEC 1.013 03/13/2020 1112   PHURINE 5.0 03/13/2020 1112   GLUCOSEU 50 (A) 03/13/2020 1112   GLUCOSEU NEGATIVE 12/28/2013 1036   HGBUR NEGATIVE 03/13/2020 1112   HGBUR trace-intact 06/29/2007 1304   BILIRUBINUR NEGATIVE 03/13/2020 1112   BILIRUBINUR negative 01/19/2017 1049   KETONESUR 5 (A) 03/13/2020 1112   PROTEINUR NEGATIVE 03/13/2020 1112   UROBILINOGEN 0.2 01/19/2017 1049   UROBILINOGEN 1.0 03/25/2015 1755   NITRITE NEGATIVE 03/13/2020 1112   LEUKOCYTESUR NEGATIVE 03/13/2020 1112   Sepsis Labs: @LABRCNTIP (procalcitonin:4,lacticidven:4)  ) Recent Results (from the past 240 hour(s))  Blood Culture (routine x 2)     Status: None   Collection Time: 03/12/20  8:00 PM   Specimen: BLOOD  Result Value Ref Range Status   Specimen Description   Final    BLOOD RIGHT ANTECUBITAL Performed at [facility] [address] [place], [place] [address]    Special Requests   Final    BOTTLES DRAWN AEROBIC AND ANAEROBIC Blood Culture adequate volume Performed at [facility] [address], [place], [place] [address]    Culture   Final    NO GROWTH 5 DAYS Performed at [facility], [place] [address], [place], [place] [address]    Report Status 03/17/2020 FINAL  Final  Blood Culture (routine x 2)     Status: None   Collection Time: 03/12/20  8:10 PM   Specimen: BLOOD RIGHT FOREARM  Result Value Ref Range Status   Specimen Description   Final    BLOOD RIGHT FOREARM Performed at [facility] [address], [place], [place] [address]    Special  Requests   Final    BOTTLES DRAWN AEROBIC AND ANAEROBIC Blood Culture adequate volume Performed at [facility] [address] [place], [place] [address]    Culture   Final    NO GROWTH 5 DAYS Performed at [facility], [place] [address], [place], [place] [address]    Report Status 03/17/2020 FINAL  Final  Respiratory Panel by RT PCR (Flu A&B, Covid) - Nasopharyngeal Swab     Status: None   Collection Time: 03/12/20  8:42 PM   Specimen: Nasopharyngeal Swab  Result Value Ref Range Status   SARS Coronavirus 2 by RT PCR NEGATIVE NEGATIVE Final    Comment: (NOTE) SARS-CoV-2 target nucleic acids are NOT DETECTED.  The SARS-CoV-2 RNA is generally detectable in upper respiratoy specimens during the acute phase of infection. The lowest concentration of SARS-CoV-2 viral copies this assay can detect is 131 copies/mL. A negative result does not preclude SARS-Cov-2 infection and should not be used as the sole basis for treatment or other patient management decisions. A negative result Nichelson occur with  improper specimen collection/handling, submission of specimen other than nasopharyngeal swab, presence of viral mutation(s) within the areas targeted by this assay, and inadequate number of viral copies (<131 copies/mL). A negative result must be combined with clinical observations, patient history, and epidemiological information. The expected result is Negative.  Fact Sheet for Patients:  PinkCheek.be  Fact Sheet for Healthcare Providers:  GravelBags.it  This test is no t yet approved or cleared  by the [place] and  has been authorized for detection and/or diagnosis of SARS-CoV-2 by FDA under an Emergency Use Authorization (EUA). This EUA will remain  in effect (meaning this test can be used) for the duration of the COVID-19 declaration under Section 564(b)(1) of the Act, 21 U.S.C. section  360bbb-3(b)(1), unless the authorization is terminated or revoked sooner.     Influenza A by PCR NEGATIVE NEGATIVE Final   Influenza B by PCR NEGATIVE NEGATIVE Final    Comment: (NOTE) The Xpert Xpress SARS-CoV-2/FLU/RSV assay is intended as an aid in  the diagnosis of influenza from Nasopharyngeal swab specimens and  should not be used as a sole basis for treatment. Nasal washings and  aspirates are unacceptable for Xpert Xpress SARS-CoV-2/FLU/RSV  testing.  Fact Sheet for Patients: PinkCheek.be  Fact Sheet for Healthcare Providers: GravelBags.it  This test is not yet approved or cleared by the [place] FDA and  has been authorized for detection and/or diagnosis of SARS-CoV-2 by  FDA under an Emergency Use Authorization (EUA). This EUA will remain  in effect (meaning this test can be used) for the duration of the  Covid-19 declaration under Section 564(b)(1) of the Act, 21  U.S.C. section 360bbb-3(b)(1), unless the authorization is  terminated or revoked. Performed at [facility], [place] [address], [place], [place] [address]   Urine culture     Status: None   Collection Time: 03/13/20 11:12 AM   Specimen: In/Out Cath Urine  Result Value Ref Range Status   Specimen Description   Final    IN/OUT CATH URINE Performed at [facility] [address] [place], [place] [address]    Special Requests   Final    NONE Performed at [facility], [place] [address], [place], [place] [address]    Culture   Final    NO GROWTH Performed at [facility], [place] [address] [place],  [address]    Report Status 03/14/2020 FINAL  Final      Studies: No results found.  Scheduled Meds: . acetaminophen  650 mg Oral Q6H  . cholecalciferol  5,000 Units Oral Daily  . [START ON 03/26/2020] cyanocobalamin  1,000 mcg Intramuscular Q21 days  . enoxaparin (LOVENOX)  injection  40 mg Subcutaneous Q24H  . escitalopram  10 mg Oral Daily  . feeding supplement (GLUCERNA SHAKE)  237 mL Oral TID BM  . fluconazole  150 mg Oral Once  . ibuprofen  400 mg Oral Q6H  . insulin glargine  10 Units Subcutaneous QHS  . levothyroxine  75 mcg Oral QAC breakfast  . metoCLOPramide  5 mg Oral TID AC  . phenytoin  100 mg Oral Daily   And  . phenytoin  50 mg Oral Daily  . phenytoin  200 mg Oral QHS  . potassium chloride  40 mEq Oral BID  . primidone  150 mg Oral Daily   And  . primidone  100 mg Oral QHS  . sodium chloride  2 g Oral BID WC  . vitamin B-1  250 mg Oral QHS  . trandolapril  2 mg Oral QHS   And  . verapamil  240 mg Oral QHS    Continuous Infusions: . calcium gluconate Stopped (03/17/20 1029)     LOS: 6 days     Kayleen Memos, MD [facility] Pager [phone]  If 7PM-7AM, please contact night-coverage [email or web address] Password [facility] 03/18/2020, 4:05 PM

## 2020-03-18 NOTE — Progress Notes (Signed)
Subjective No recorded episodes of emesis. Minimal nausea at this point. Passing flatus and having plenty of good BMs.  Objective: Vital signs in last 24 hours: Temp:  [97.9 F (36.6 C)-98.5 F (36.9 C)] 98 F (36.7 C) (11/15 0603) Pulse Rate:  [88-110] 88 (11/15 0603) Resp:  [18] 18 (11/15 0603) BP: (130-153)/(72-83) 130/72 (11/15 0603) SpO2:  [91 %-97 %] 91 % (11/15 0603) Last BM Date: 03/17/20  Intake/Output from previous day: 11/14 0701 - 11/15 0700 In: 5318.4 [I.V.:5217.1; IV Piggyback:101.3] Out: 1450 [Urine:1450] Intake/Output this shift: No intake/output data recorded.  Gen: NAD, comfortable, mentation appropriate - A&O x3 CV: RRR Pulm: Normal work of breathing Abd: Soft, mild/mod tenderness in RLQ; no left sided tenderness; no rebound/guarding. Incisions c/d/i without erythema or drainage. Ext: SCDs in place  Lab Results: CBC  Recent Labs    03/16/20 1105 03/18/20 0557  WBC 9.7 9.5  HGB 10.3* 9.9*  HCT 32.2* 30.1*  PLT 389 354   BMET Recent Labs    03/17/20 1220 03/17/20 1220 03/17/20 1520 03/18/20 0557  NA 126*   < > 127* 128*  K 3.7  --   --  3.2*  CL 103  --   --  104  CO2 11*  --   --  18*  GLUCOSE 168*  --   --  153*  BUN 5*  --   --  <5*  CREATININE 0.80  --   --  0.54  CALCIUM 7.6*  --   --  7.4*   < > = values in this interval not displayed.   PT/INR No results for input(s): LABPROT, INR in the last 72 hours. ABG No results for input(s): PHART, HCO3 in the last 72 hours.  Invalid input(s): PCO2, PO2  Studies/Results:  Anti-infectives: Anti-infectives (From admission, onward)   Start     Dose/Rate Route Frequency Ordered Stop   03/14/20 1630  fluconazole (DIFLUCAN) tablet 150 mg        150 mg Oral  Once 03/14/20 1541     03/13/20 2200  meropenem (MERREM) 1 g in sodium chloride 0.9 % 100 mL IVPB  Status:  Discontinued        1 g 200 mL/hr over 30 Minutes Intravenous Every 12 hours 03/13/20 1015 03/15/20 0923   03/13/20 0915   meropenem (MERREM) 1 g in sodium chloride 0.9 % 100 mL IVPB  Status:  Discontinued        1 g 200 mL/hr over 30 Minutes Intravenous Every 8 hours 03/13/20 0854 03/13/20 1015   03/12/20 2200  meropenem (MERREM) 1 g in sodium chloride 0.9 % 100 mL IVPB        1 g 200 mL/hr over 30 Minutes Intravenous  Once 03/12/20 2150 03/12/20 2355   03/12/20 1945  metroNIDAZOLE (FLAGYL) IVPB 500 mg        500 mg 100 mL/hr over 60 Minutes Intravenous  Once 03/12/20 1936 03/12/20 2133   03/12/20 1945  vancomycin (VANCOCIN) IVPB 1000 mg/200 mL premix        1,000 mg 200 mL/hr over 60 Minutes Intravenous  Once 03/12/20 1936 03/12/20 2133       Assessment/Plan: Patient Active Problem List   Diagnosis Date Noted  . Nausea & vomiting 03/12/2020  . Cancer of ascending colon pT3pN0 (0/12 LN) s/p lap right colectomy 03/06/2020 03/09/2020  . History of multiple allergies 03/09/2020  . S/P right hemicolectomy 03/06/2020  . Closed fracture of shaft of tibia 02/12/2020  . Gait  abnormality 12/04/2019  . Impaired left ventricular function 09/05/2019  . Hypokalemia   . Effusion of left knee joint   . Hyponatremia   . Left patella fracture 10/26/2018  . Fracture of patella 10/26/2018  . Acquired hypothyroidism 08/25/2017  . 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 Hammer toe 08/25/2017  . History of breast cancer 08/25/2017  . Nontoxic thyroid nodule 08/25/2017  . Osteopetrosis 08/25/2017  . Dysuria 01/19/2017  . Vitamin D deficiency 11/18/2016  . Tremor, essential 08/18/2016  . Convulsions/seizures ([place]) 10/16/2014  . Brain tumor (benign) ([place]) 10/18/2013  . Subdural hemorrhage ([place]) 10/09/2013  . Anxiety 10/09/2013  . Compression fracture of T12 vertebra ([place]) 10/09/2013  . Nontoxic multinodular goiter 09/05/2013  . Syncope 08/27/2013  . Carotid artery disease ([place]) 08/27/2013  . Abnormal thyroid ultrasound 08/27/2013  . Cancer of central portion of female breast ([place]) 12/30/2012  .  Osteoporosis 03/06/2010  . ASTHMA 03/05/2009  . Asthma 03/05/2009  . IRRITABLE BOWEL SYNDROME  diarrhea type 03/21/2008  . ANEMIA-NOS 12/16/2006  . GERD 12/16/2006  . Insulin-requiring or dependent type II diabetes mellitus ([place]) 10/29/2006  . Mixed hyperlipidemia 10/29/2006  . Essential hypertension 10/29/2006  . Allergic rhinitis, cause unspecified 10/29/2006  . OSTEOARTHRITIS 10/29/2006   -soft diet + supplements - prefers chocolate flavor if available -monitor PO intake -PPx: chemical dvt ppx, hgb stable  -Dispo: requesting [place] [facility] place if able  Nadeen Landau, MD [facility] Use [email or web address] to contact on call provider

## 2020-03-18 NOTE — TOC Progression Note (Signed)
Transition of Care [facility]) - Progression Note    Patient Details  Name: [name] MRN: [id number] Date of Birth: [birth year]  [facility] CM/SW Contact  Joaquin Courts, RN Phone Number: 03/18/2020, 9:29 AM  Clinical Narrative:    Patient provided with bed offers.  Patient is agreeable to [facility] SN[facility].  Patient will need an updated Covid test prior to dc.     Expected Discharge Plan: [place] Barriers to Discharge: Continued Medical Work up  Expected Discharge Plan and Services Expected Discharge Plan: [place] In-house Referral: Clinical Social Work Discharge Planning Services: CM Consult Post Acute Care Choice: [place] arrangements for the past 2 months: [place] Determinants of Health (SDOH) Interventions    Readmission Risk Interventions Readmission Risk Prevention Plan 03/15/2020 03/13/2020  Transportation Screening Complete -  PCP or Specialist Appt within 3-5 Days Complete Complete  HRI or [place] - Complete  Social Work Consult for [place] Planning/Counseling Complete Complete  Palliative Care Screening Complete Complete  Medication Review Press photographer) Complete Complete  Some recent data might be hidden

## 2020-03-19 DIAGNOSIS — R112 Nausea with vomiting, unspecified: Secondary | ICD-10-CM | POA: Diagnosis not present

## 2020-03-19 LAB — BASIC METABOLIC PANEL
Anion gap: 7 (ref 5–15)
BUN: <5 mg/dL — ABNORMAL LOW (ref 8–23)
CO2: 19 mmol/L — ABNORMAL LOW (ref 22–32)
Calcium: 7.4 mg/dL — ABNORMAL LOW (ref 8.9–10.3)
Chloride: 103 mmol/L (ref 98–111)
Creatinine, Ser: 0.56 mg/dL (ref 0.44–1.00)
GFR, Estimated: >60 mL/min (ref >60)
Glucose, Bld: 256 mg/dL — ABNORMAL HIGH (ref 70–99)
Potassium: 3.8 mmol/L (ref 3.5–5.1)
Sodium: 129 mmol/L — ABNORMAL LOW (ref 135–145)

## 2020-03-19 LAB — CBC
HCT: 32.9 % — ABNORMAL LOW (ref 36.0–46.0)
Hemoglobin: 11 g/dL — ABNORMAL LOW (ref 12.0–15.0)
MCH: 27.7 pg (ref 26.0–34.0)
MCHC: 33.4 g/dL (ref 30.0–36.0)
MCV: 82.9 fL (ref 80.0–100.0)
Platelets: 354 10*3/uL (ref 150–400)
RBC: 3.97 MIL/uL (ref 3.87–5.11)
RDW: 21 % — ABNORMAL HIGH (ref 11.5–15.5)
WBC: 11.8 10*3/uL — ABNORMAL HIGH (ref 4.0–10.5)
nRBC: 0 % (ref 0.0–0.2)

## 2020-03-19 LAB — GLUCOSE, CAPILLARY
Glucose-Capillary: 183 mg/dL — ABNORMAL HIGH (ref 70–99)
Glucose-Capillary: 199 mg/dL — ABNORMAL HIGH (ref 70–99)
Glucose-Capillary: 181 mg/dL — ABNORMAL HIGH (ref 70–99)
Glucose-Capillary: 147 mg/dL — ABNORMAL HIGH (ref 70–99)

## 2020-03-19 LAB — MAGNESIUM
Magnesium: 1.1 mg/dL — ABNORMAL LOW (ref 1.7–2.4)
Magnesium: 2.1 mg/dL (ref 1.7–2.4)

## 2020-03-19 MED ORDER — METOCLOPRAMIDE HCL 10 MG/10ML PO SOLN
5.0000 mg | Freq: Three times a day (TID) | ORAL | 0 refills | Status: DC
Start: 2020-03-19 — End: 2020-03-20

## 2020-03-19 MED ORDER — MAGNESIUM OXIDE 400 (241.3 MG) MG PO TABS
800.0000 mg | ORAL_TABLET | Freq: Two times a day (BID) | ORAL | 0 refills | Status: DC
Start: 2020-03-19 — End: 2020-03-19

## 2020-03-19 MED ORDER — POTASSIUM CHLORIDE 10 MEQ/100ML IV SOLN
10.0000 meq | INTRAVENOUS | Status: DC
  Administered 2020-03-19 (×2): 10 meq via INTRAVENOUS
  Filled 2020-03-19 (×2): qty 100

## 2020-03-19 MED ORDER — POTASSIUM CHLORIDE 2 MEQ/ML IV SOLN
INTRAVENOUS | Status: DC
Start: 2020-03-19 — End: 2020-03-19

## 2020-03-19 MED ORDER — MAGNESIUM SULFATE 4 GM/100ML IV SOLN
4.0000 g | Freq: Two times a day (BID) | INTRAVENOUS | Status: DC
  Administered 2020-03-19: 4 g via INTRAVENOUS
  Filled 2020-03-19 (×2): qty 100

## 2020-03-19 MED ORDER — POLYETHYLENE GLYCOL 3350 17 G PO PACK: 17.0000 g | PACK | Freq: Every day | ORAL | 0 refills | Status: DC | PRN

## 2020-03-19 MED ORDER — INSULIN GLARGINE 100 UNIT/ML ~~LOC~~ SOLN
10.0000 [IU] | Freq: Two times a day (BID) | SUBCUTANEOUS | Status: DC
  Administered 2020-03-19 – 2020-03-21 (×5): 10 [IU] via SUBCUTANEOUS
  Filled 2020-03-19 (×5): qty 0.1

## 2020-03-19 MED ORDER — OXYCODONE HCL 5 MG PO TABS: 5.0000 mg | ORAL_TABLET | Freq: Four times a day (QID) | ORAL | 0 refills | Status: AC | PRN

## 2020-03-19 MED ORDER — ONDANSETRON 4 MG PO TBDP: 4.0000 mg | ORAL_TABLET | Freq: Four times a day (QID) | ORAL | 0 refills | Status: DC | PRN

## 2020-03-19 MED ORDER — POTASSIUM CHLORIDE IN NACL 40-0.9 MEQ/L-% IV SOLN
INTRAVENOUS | Status: DC
  Filled 2020-03-19: qty 1000

## 2020-03-19 MED ORDER — MAGNESIUM OXIDE 400 (241.3 MG) MG PO TABS
800.0000 mg | ORAL_TABLET | Freq: Two times a day (BID) | ORAL | Status: DC
  Administered 2020-03-19: 800 mg via ORAL
  Filled 2020-03-19: qty 2

## 2020-03-19 MED ORDER — POLYETHYLENE GLYCOL 3350 17 G PO PACK: 17.0000 g | PACK | Freq: Every day | ORAL | Status: DC | PRN

## 2020-03-19 NOTE — Progress Notes (Signed)
Elevated HR 155 which did not sustain and abated immediately upon starting.  Charge nurse is aware.  Pt remains asymptomatic and is sleeping currently.

## 2020-03-19 NOTE — Discharge Instructions (Signed)
POST OP INSTRUCTIONS AFTER COLON SURGERY  1. DIET: Be sure to include lots of fluids daily to stay hydrated - 64oz of water per day (8, 8 oz glasses).  Avoid fast food or heavy meals for the first couple of weeks as your are more likely to get nauseated. Avoid raw/uncooked fruits or vegetables for the first 4 weeks (its ok to have these if they are blended into smoothie form). If you have fruits/vegetables, make sure they are cooked until soft enough to mash on the roof of your mouth and chew your food well. Otherwise, diet as tolerated.  2. Take your usually prescribed home medications unless otherwise directed.  3. PAIN CONTROL: a. Pain is best controlled by a usual combination of three different methods TOGETHER: i. Ice/Heat ii. Over the counter pain medication iii. Prescription pain medication b. Most patients will experience some swelling and bruising around the surgical site.  Ice packs or heating pads (30-60 minutes up to 6 times a day) will help. Some people prefer to use ice alone, heat alone, alternating between ice & heat.  Experiment to what works for you.  Swelling and bruising can take several weeks to resolve.   c. It is helpful to take an over-the-counter pain medication regularly for the first few weeks: i. Ibuprofen (Motrin/Advil) - 200mg tabs - take 3 tabs (600mg) every 6 hours as needed for pain (unless you have been directed previously to avoid NSAIDs/ibuprofen) ii. Acetaminophen (Tylenol) - you Schachter take 650mg every 6 hours as needed. You can take this with motrin as they act differently on the body. If you are taking a narcotic pain medication that has acetaminophen in it, do not take over the counter tylenol at the same time. iii. NOTE: You Coey take both of these medications together - most patients  find it most helpful when alternating between the two (i.e. Ibuprofen at 6am, tylenol at 9am, ibuprofen at 12pm ...) d. A  prescription for pain medication should be given to you  upon discharge.  Take your pain medication as prescribed if your pain is not adequatly controlled with the over-the-counter pain reliefs mentioned above.  4. Avoid getting constipated.  Between the surgery and the pain medications, it is common to experience some constipation.  Increasing fluid intake and taking a fiber supplement (such as Metamucil, Citrucel, FiberCon, MiraLax, etc) 1-2 times a day regularly will usually help prevent this problem from occurring.  A mild laxative (prune juice, Milk of Magnesia, MiraLax, etc) should be taken according to package directions if there are no bowel movements after 48 hours.    5. Dressing: Your incisions are covered in Dermabond which is like sterile superglue for the skin. This will come off on it's own in a couple weeks. It is waterproof and you Sarti bathe normally starting the day after your surgery in a shower. Avoid baths/pools/lakes/oceans until your wounds have fully healed.  6. ACTIVITIES as tolerated:   a. Avoid heavy lifting (>10lbs or 1 gallon of milk) for the next 6 weeks. b. You Pilar resume regular daily activities as tolerated--such as daily self-care, walking, climbing stairs--gradually increasing activities as tolerated.  If you can walk 30 minutes without difficulty, it is safe to try more intense activity such as jogging, treadmill, bicycling, low-impact aerobics.  c. DO NOT PUSH THROUGH PAIN.  Let pain be your guide: If it hurts to do something, don't do it. d. You [address] drive when you are no longer taking prescription pain medication, you   can comfortably wear a seatbelt, and you can safely maneuver your car and apply brakes.  7. FOLLOW UP in our office a. Please call [facility] at [phone] to set up an appointment to see your surgeon in the office for a follow-up appointment approximately 2 weeks after your surgery. b. Make sure that you call for this appointment the day you arrive home to insure a convenient appointment time.  9. If you  have disability or family leave forms that need to be completed, you Isidro have them completed by your primary care physician's office; for return to work instructions, please ask our office staff and they will be happy to assist you in obtaining this documentation   When to call us [phone]: 1. Poor pain control 2. Reactions / problems with new medications (rash/itching, etc)  3. Fever over 101.5 F (38.5 C) 4. Inability to urinate 5. Nausea/vomiting 6. Worsening swelling or bruising 7. Continued bleeding from incision. 8. Increased pain, redness, or drainage from the incision  The clinic staff is available to answer your questions during regular business hours (8:30am-5pm).  Please don't hesitate to call and ask to speak to one of our nurses for clinical concerns.   A surgeon from [facility] is always on call at the hospitals   If you have a medical emergency, go to the nearest emergency room or call 911.  [facility] [address], [address], [place], [place]  [address] MAIN: [phone] FAX: [phone] [email or web address] 

## 2020-03-19 NOTE — Progress Notes (Signed)
Subjective She is up in the chair this morning - drinking her glucerna. Denies nausea/vomiting. Passing flatus and having good bowel function - 2 BMs in last 24 hrs.  Objective: Vital signs in last 24 hours: Temp:  [97.5 F (36.4 C)-98.3 F (36.8 C)] 98.3 F (36.8 C) (11/16 0458) Pulse Rate:  [88-110] 88 (11/16 0458) Resp:  [16-21] 16 (11/16 0458) BP: (124-140)/(60-74) 124/69 (11/16 0458) SpO2:  [93 %-96 %] 96 % (11/16 0458) Last BM Date: 03/18/20  Intake/Output from previous day: 11/15 0701 - 11/16 0700 In: 540 [P.O.:540] Out: 950 [Urine:950] Intake/Output this shift: No intake/output data recorded.  Gen: NAD, comfortable, mentation appropriate - A&O x3 CV: RRR Pulm: Normal work of breathing Abd: Soft, not significantly tender throughout; incisions c/d/i without erythema or drainage. Ext: SCDs in place  Lab Results: CBC  Recent Labs    03/16/20 1105 03/18/20 0557  WBC 9.7 9.5  HGB 10.3* 9.9*  HCT 32.2* 30.1*  PLT 389 354   BMET Recent Labs    03/18/20 0557 03/18/20 1614  NA 128* 128*  K 3.2* 3.1*  CL 104 102  CO2 18* 18*  GLUCOSE 153* 255*  BUN <5* <5*  CREATININE 0.54 0.63  CALCIUM 7.4* 7.2*   PT/INR No results for input(s): LABPROT, INR in the last 72 hours. ABG No results for input(s): PHART, HCO3 in the last 72 hours.  Invalid input(s): PCO2, PO2  Studies/Results:  Anti-infectives: Anti-infectives (From admission, onward)   Start     Dose/Rate Route Frequency Ordered Stop   03/14/20 1630  fluconazole (DIFLUCAN) tablet 150 mg  Status:  Discontinued        150 mg Oral  Once 03/14/20 1541 03/19/20 0616   03/13/20 2200  meropenem (MERREM) 1 g in sodium chloride 0.9 % 100 mL IVPB  Status:  Discontinued        1 g 200 mL/hr over 30 Minutes Intravenous Every 12 hours 03/13/20 1015 03/15/20 0923   03/13/20 0915  meropenem (MERREM) 1 g in sodium chloride 0.9 % 100 mL IVPB  Status:  Discontinued        1 g 200 mL/hr over 30 Minutes Intravenous Every  8 hours 03/13/20 0854 03/13/20 1015   03/12/20 2200  meropenem (MERREM) 1 g in sodium chloride 0.9 % 100 mL IVPB        1 g 200 mL/hr over 30 Minutes Intravenous  Once 03/12/20 2150 03/12/20 2355   03/12/20 1945  metroNIDAZOLE (FLAGYL) IVPB 500 mg        500 mg 100 mL/hr over 60 Minutes Intravenous  Once 03/12/20 1936 03/12/20 2133   03/12/20 1945  vancomycin (VANCOCIN) IVPB 1000 mg/200 mL premix        1,000 mg 200 mL/hr over 60 Minutes Intravenous  Once 03/12/20 1936 03/12/20 2133       Assessment/Plan: Patient Active Problem List   Diagnosis Date Noted  . Nausea & vomiting 03/12/2020  . Cancer of ascending colon pT3pN0 (0/12 LN) s/p lap right colectomy 03/06/2020 03/09/2020  . History of multiple allergies 03/09/2020  . S/P right hemicolectomy 03/06/2020  . Closed fracture of shaft of tibia 02/12/2020  . Gait abnormality 12/04/2019  . Impaired left ventricular function 09/05/2019  . Hypokalemia   . Effusion of left knee joint   . Hyponatremia   . Left patella fracture 10/26/2018  . Fracture of patella 10/26/2018  . Acquired hypothyroidism 08/25/2017  . 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 Hammer toe  08/25/2017  . History of breast cancer 08/25/2017  . Nontoxic thyroid nodule 08/25/2017  . Osteopetrosis 08/25/2017  . Dysuria 01/19/2017  . Vitamin D deficiency 11/18/2016  . Tremor, essential 08/18/2016  . Convulsions/seizures ([place]) 10/16/2014  . Brain tumor (benign) ([place]) 10/18/2013  . Subdural hemorrhage ([place]) 10/09/2013  . Anxiety 10/09/2013  . Compression fracture of T12 vertebra ([place]) 10/09/2013  . Nontoxic multinodular goiter 09/05/2013  . Syncope 08/27/2013  . Carotid artery disease ([place]) 08/27/2013  . Abnormal thyroid ultrasound 08/27/2013  . Cancer of central portion of female breast ([place]) 12/30/2012  . Osteoporosis 03/06/2010  . ASTHMA 03/05/2009  . Asthma 03/05/2009  . IRRITABLE BOWEL SYNDROME  diarrhea type 03/21/2008  . ANEMIA-NOS  12/16/2006  . GERD 12/16/2006  . Insulin-requiring or dependent type II diabetes mellitus ([place]) 10/29/2006  . Mixed hyperlipidemia 10/29/2006  . Essential hypertension 10/29/2006  . Allergic rhinitis, cause unspecified 10/29/2006  . OSTEOARTHRITIS 10/29/2006   -Soft diet + supplements - prefers chocolate flavor glucerna -Monitor PO intake -PPx: SCDs, SQH  -Dispo: requesting [facility] if able - pending clearance for discharge by medicine service - they are currently monitoring electrolytes which were low   Nadeen Landau, MD [facility] Use [email or web address] to contact on call provider

## 2020-03-19 NOTE — TOC Progression Note (Signed)
Transition of Care [facility]) - Progression Note    Patient Details  Name: [name] MRN: [id number] Date of Birth: 08-20-1929  [facility] CM/SW Contact  Joaquin Courts, RN Phone Number: 03/19/2020, 12:55 PM  Clinical Narrative:  CM called [facility] to confirm bed availability and was informed facility is now full and cannot accept patient at this time, no anticipated vacancy. CM spoke with patient and explained this to her and discussed need to broaden facility search.  Patient is agreeable to broaden search but states she does not feel that she is ready to leave the hospital this week. FL2 faxed out to all area facilities.       Expected Discharge Plan: [place] Barriers to Discharge: Continued Medical Work up  Expected Discharge Plan and Services Expected Discharge Plan: [place] In-house Referral: Clinical Social Work Discharge Planning Services: CM Consult Post Acute Care Choice: [place] arrangements for the past 2 months: [place] Determinants of Health (SDOH) Interventions    Readmission Risk Interventions Readmission Risk Prevention Plan 03/15/2020 03/13/2020  Transportation Screening Complete -  PCP or Specialist Appt within 3-5 Days Complete Complete  HRI or [place] - Complete  Social Work Consult for [place] Planning/Counseling Complete Complete  Palliative Care Screening Complete Complete  Medication Review [employer]) Complete Complete  Some recent data might be hidden

## 2020-03-19 NOTE — Progress Notes (Signed)
HR elevated at 170 but did not sustain.  Will continue to monitor.

## 2020-03-19 NOTE — Progress Notes (Signed)
Physical Therapy Treatment Patient Details Name: [name] MRN: [id number] DOB: [birth year] Today's Date: 03/19/2020    History of Present Illness [name] is an 86 y.o. female with a past medical history of chronic hyponatremia, IDDM2, hypothyroidism, hypertension, hyperlipidemia, left tib/fib fracture, breast cancer, brain tumor s/p surgery complicated with seizures, colon cancer, recent hospitalization from 11/3-11/9 for laparoscopic right hemicolectomy with extensive adhesiolysis by Dr. Dema Severin on 11/3 for colon cancer complicated by postop bleeding requiring blood transfusions.  Was discharged the day prior to this presentation but returns to the ED due to persistent generalized weakness, nausea and abdominal pain.  CT imaging was performed which showed mild small bowel dilatation and fluid within the abdomen consistent with blood.    PT Comments    Pt was able to transfer and take a few steps today with min-mod A ; however, fatigues easily and required max encouragement to do what she can rather than relying on assistance in order to get stronger. Continue to progress as able.     Follow Up Recommendations  SNF     Equipment Recommendations  None recommended by PT    Recommendations for Other Services       Precautions / Restrictions Precautions Precautions: Fall    Mobility  Bed Mobility Overal bed mobility: Needs Assistance Bed Mobility: Sit to Sidelying;Rolling Rolling: Min assist Sidelying to sit: Mod assist     Sit to sidelying: Min assist General bed mobility comments: patient requires increased time and cues to sequence, use of bed pad to assist with rolling   Transfers Overall transfer level: Needs assistance Equipment used: Rolling walker (2 wheeled) Transfers: Sit to/from Stand Sit to Stand: Mod assist         General transfer comment: increased time, cues for foot and hand placement, mod A to boost and stabilize  initially  Ambulation/Gait Ambulation/Gait assistance: Min assist Gait Distance (Feet): 2 Feet Assistive device: Rolling walker (2 wheeled) Gait Pattern/deviations: Step-to pattern;Decreased stride length;Shuffle Gait velocity: decreased   General Gait Details: Pt stated "go ahead and turn me around" - encouraged her to go ahead and start taking steps with PT providing support for stability   Stairs             Wheelchair Mobility    Modified Rankin (Stroke Patients Only)       Balance Overall balance assessment: Needs assistance Sitting-balance support: Feet supported Sitting balance-Leahy Scale: Fair Sitting balance - Comments: EOB without assist but did not challenge   Standing balance support: Bilateral upper extremity supported Standing balance-Leahy Scale: Poor Standing balance comment: Requiring RW and min A                            Cognition Arousal/Alertness: Awake/alert Behavior During Therapy: Flat affect Overall Cognitive Status: Within Functional Limits for tasks assessed                                 General Comments: somnolent - focused on pain , required encouragement to do things on her own      Exercises General Exercises - Lower Extremity Ankle Circles/Pumps: AROM;Both;10 reps;Supine Quad Sets: AROM;Both;10 reps;Supine Short Arc Quad: AAROM;Both;5 reps;Supine Heel Slides: AAROM;Both;10 reps;Supine Hip ABduction/ADduction: AAROM;Both;10 reps;Supine Other Exercises Other Exercises: Fatigues easily with exercises, required encouragement to do what she could and assist to complete and for correct form  General Comments General comments (skin integrity, edema, etc.): Pt requiring encouragement and education on importance of mobility and doing what she can to improve strength.      Pertinent Vitals/Pain Pain Assessment: Faces Faces Pain Scale: Hurts little more Pain Location: R arm IV site and abdomen Pain  Descriptors / Indicators: Sore;Burning Pain Intervention(s): Limited activity within patient's tolerance;Monitored during session;Premedicated before session;Repositioned    Home Living                      Prior Function            PT Goals (current goals can now be found in the care plan section) Acute Rehab PT Goals Patient Stated Goal: decrease pain PT Goal Formulation: With patient Time For Goal Achievement: 03/30/20 Potential to Achieve Goals: Good Progress towards PT goals: Progressing toward goals    Frequency    Min 3X/week      PT Plan Current plan remains appropriate    Co-evaluation              AM-PAC PT "6 Clicks" Mobility   Outcome Measure  Help needed turning from your back to your side while in a flat bed without using bedrails?: A Lot Help needed moving from lying on your back to sitting on the side of a flat bed without using bedrails?: A Lot Help needed moving to and from a bed to a chair (including a wheelchair)?: A Lot Help needed standing up from a chair using your arms (e.g., wheelchair or bedside chair)?: A Lot Help needed to walk in hospital room?: A Lot Help needed climbing 3-5 steps with a railing? : Total 6 Click Score: 11    End of Session Equipment Utilized During Treatment: Gait belt Activity Tolerance: Patient limited by fatigue Patient left: in bed;with call bell/phone within reach;with bed alarm set;with family/visitor present Nurse Communication: Mobility status (c/o IV burnign - RN reports potassium is going as slow as possible) PT Visit Diagnosis: Other abnormalities of gait and mobility (R26.89);Muscle weakness (generalized) (M62.81);Unsteadiness on feet (R26.81);Pain     Time: 3300-7622 PT Time Calculation (min) (ACUTE ONLY): 20 min  Charges:  $Therapeutic Exercise: 8-22 mins                     Abran Richard, PT Acute Rehab Services Pager [phone] [facility] 03/19/2020, 1:37 PM

## 2020-03-19 NOTE — Progress Notes (Signed)
Occupational Therapy Treatment Patient Details Name: [name] MRN: [id number] DOB: 07/22/1929 Today's Date: 03/19/2020    History of present illness [name] is an 86 y.o. female with a past medical history of chronic hyponatremia, IDDM2, hypothyroidism, hypertension, hyperlipidemia, left tib/fib fracture, breast cancer, brain tumor s/p surgery complicated with seizures, colon cancer, recent hospitalization from 11/3-11/9 for laparoscopic right hemicolectomy with extensive adhesiolysis by Dr. Dema Severin on 11/3 for colon cancer complicated by postop bleeding requiring blood transfusions.  Was discharged the day prior to this presentation but returns to the ED due to persistent generalized weakness, nausea and abdominal pain.  CT imaging was performed which showed mild small bowel dilatation and fluid within the abdomen consistent with blood.   OT comments  Patient with global weakness and decreased activity tolerance requiring mod A for bed mobility. Allowed for increased time to recover, with bed height elevated and mod A to stand patient with LE buckling with cues for sequencing, safety and to push with UEs on rolling walker to assist with weight shifting. Patient having increased difficulty advancing LEsr requiring mod A into the chair. CNA present and recommend stedy for back to bed.    Follow Up Recommendations  SNF;Supervision/Assistance - 24 hour    Equipment Recommendations  None recommended by OT       Precautions / Restrictions Precautions Precautions: Fall       Mobility Bed Mobility Overal bed mobility: Needs Assistance Bed Mobility: Rolling;Sidelying to Sit Rolling: Mod assist Sidelying to sit: Mod assist       General bed mobility comments: patient requires increased time and cues to sequence, use of bed pad to assist with rolling   Transfers Overall transfer level: Needs assistance Equipment used: Rolling walker (2 wheeled) Transfers: Sit to/from Stand Sit to  Stand: Mod assist;From elevated surface         General transfer comment: please see ADL section for transfer to recliner    Balance Overall balance assessment: Needs assistance Sitting-balance support: Feet supported Sitting balance-Leahy Scale: Fair     Standing balance support: Bilateral upper extremity supported Standing balance-Leahy Scale: Poor Standing balance comment: RW and mod a                           ADL either performed or assessed with clinical judgement   ADL Overall ADL's : Needs assistance/impaired                     Lower Body Dressing: Total assistance;Bed level   Toilet Transfer: Moderate assistance;Stand-pivot;Cueing for safety;Cueing for sequencing;BSC;RW Toilet Transfer Details (indicate cue type and reason): significant difficulty advancing LEs, cues to push through UEs to assist with weight shifting however patient with LE buckling requiring mod A to safely pivot into chair         Functional mobility during ADLs: Moderate assistance;Cueing for safety;Cueing for sequencing;Rolling walker General ADL Comments: patient with global weakness, decrerased activity tolerance requiring moderate assistance to safely transfer to recliner               Cognition Arousal/Alertness: Awake/alert Behavior During Therapy: Flat affect Overall Cognitive Status: Within Functional Limits for tasks assessed  Pertinent Vitals/ Pain       Pain Assessment: Faces Faces Pain Scale: Hurts little more Pain Location: R UE (IV site) Pain Descriptors / Indicators: Sore Pain Intervention(s): Premedicated before session         Frequency  Min 2X/week        Progress Toward Goals  OT Goals(current goals can now be found in the care plan section)  Progress towards OT goals: Progressing toward goals  Acute Rehab OT Goals Patient Stated Goal: decrease pain OT Goal  Formulation: With patient Time For Goal Achievement: 03/29/20 Potential to Achieve Goals: Fair ADL Goals Pt Will Perform Eating: with set-up;sitting Pt Will Perform Grooming: with set-up;sitting Pt Will Perform Upper Body Dressing: with modified independence;sitting Pt Will Perform Lower Body Dressing: with supervision;sit to/from stand;sitting/lateral leans Pt Will Transfer to Toilet: with min assist;bedside commode;stand pivot transfer Pt Will Perform Toileting - Clothing Manipulation and hygiene: with min assist;sit to/from stand Additional ADL Goal #1: Patient will perform 10 min functional activity or exercise activity as evidence of improving activity tolerance  Plan Discharge plan needs to be updated       AM-PAC OT "6 Clicks" Daily Activity     Outcome Measure   Help from another person eating meals?: A Little Help from another person taking care of personal grooming?: A Little Help from another person toileting, which includes using toliet, bedpan, or urinal?: Total Help from another person bathing (including washing, rinsing, drying)?: A Lot Help from another person to put on and taking off regular upper body clothing?: A Lot Help from another person to put on and taking off regular lower body clothing?: Total 6 Click Score: 12    End of Session Equipment Utilized During Treatment: Rolling walker  OT Visit Diagnosis: Pain Pain - part of body:  (abdomen)   Activity Tolerance Patient limited by fatigue   Patient Left in chair;with call bell/phone within reach;with chair alarm set   Nurse Communication Mobility status;Need for lift equipment (stedy)        Time: 7902-4097 OT Time Calculation (min): 18 min  Charges: OT General Charges $OT Visit: 1 Visit OT Treatments $Self Care/Home Management : 8-22 mins  [name] OT OT pager: [phone]   Rosemary Holms 03/19/2020, 10:19 AM

## 2020-03-19 NOTE — Progress Notes (Signed)
PROGRESS NOTE  [name] EXB:[id number] DOB: 18-Jan-1930 DOA: 03/12/2020 PCP: Bartholome Bill, MD  HPI/Recap of past 24 hours: [name] is an 86 y.o. female with a past medical history of chronic hyponatremia, IDDM2, hypothyroidism, hypertension, hyperlipidemia, left tib/fib fracture, breast cancer, brain tumor s/p surgery complicated with seizures, colon cancer, recent hospitalization from 11/3-11/9 for laparoscopic right hemicolectomy with extensive adhesiolysis by Dr. Dema Severin on 03/06/20 for colon cancer complicated by postop bleeding requiring blood transfusions.  Was discharged the day prior to this presentation but returns to the ED due to persistent generalized weakness, nausea and abdominal pain.  CT imaging was performed which showed mild small bowel dilatation and fluid within the abdomen consistent with blood.  CXR was unremarkable and initial lactate was elevated but repeat was normal.  Given her multiple medical comorbidities, [facility], Hospitalist team, was consulted.  Hospital course complicated by electrolyte imbalance and medical noncompliance.  Patient has refused to take some of her medications.  03/19/20: She is sitting in a chair at her bedside.  Reports abdominal pain.  No nausea.  She is alert and not in distress.    Could be discharged to SNF from a medical standpoint on 03/19/2020.  We will stop IV Dilaudid for DC planning, and continue oral magnesium supplement.   Assessment/Plan: Active Problems:   Nausea & vomiting  Colon cancer s/p right hemicolectomy on 03/06/2020 by general surgery, Dr. Dema Severin 1. Management per primary team  Hypomagnesemia Serum magnesium 1.1 on 03/18/2020 Received 4 g of IV magnesium, repeat another dose if the patient is still here. Obtain serum magnesium level at 1300. Continue p.o. mag oxide 800 mg twice daily x3 days.  Improving, hypovolemic hyponatremia   Serum sodium 128>> 129 She was started on Salt tablet but has not  taken.  Resolved post repletion: Hypokalemia K+ 3.2>> 3.8.  Type 2 diabetes, with hyperglycemia. Hemoglobin A1c 8.6 on 03/04/2020. Increase Lantus dose to 10 units twice daily Continue to monitor CBGs  Abdominal discomfort No evidence of a systemic infection or active infective process Her hemoglobin is stable and uptrending, 11 from 9.9 Patient needs to mobilize. Out of bed to chair with every shift Avoid opiate-induced constipation, add MiraLAX as needed Recommend to DC IV Dilaudid for DC planning  Acute blood loss anemia post surgery, stable Hemoglobin uptrending 11.0 from 9.9 from 10.3 from 10.4 from 10.7 No overt bleeding.  Resolved hyperkalemia Serum potassium 3.8.  Seizure disorder Continue home AEDs Last dilantin level 9.4 on 08/08/19 Seizure precautions in place Will need to follow up with her neurologist outpatient  Essential hypertension Continue home oral antihypertensives Continue to monitor vital signs  Hypothyroidism TSH normal 2.729 Continue home Synthroid  History of brain tumor s/p surgery  Ambulatory dysfunction PT OT rec SNF OOB to chair every shift Mobilize as tolerated with assistance and fall precautions    Her potassium has normalized, currently repleting serum magnesium intravenously, will repeat level at 1300.  Oral replacement has also been added.  Patient could be discharged from a medical standpoint on 03/19/2020 on oral magnesium supplement.    Thank you for this consultation.  Our [facility] hospitalist team will follow the patient with you.     Objective: Vitals:   03/18/20 0603 03/18/20 1130 03/18/20 1956 03/19/20 0458  BP: 130/72 135/60 140/74 124/69  Pulse: 88 98 (!) 110 88  Resp: 18 (!) 21 16 16   Temp: 98 F (36.7 C) (!) 97.5 F (36.4 C) 98 F (36.7 C) 98.3  F (36.8 C)  TempSrc:   Oral Oral  SpO2: 91% 94% 93% 96%  Weight:      Height:        Intake/Output Summary (Last 24 hours) at 03/19/2020 1236 Last data filed  at 03/19/2020 0445 Gross per 24 hour  Intake 300 ml  Output 950 ml  Net -650 ml   Filed Weights   03/12/20 2245  Weight: 65 kg    Exam:   . General: 86 y.o. year-old female well-developed well-nourished in no acute stress.  Alert and interactive.   . Cardiovascular: Regular rate and rhythm no rubs or gallops. Marland Kitchen Respiratory: Clear to auscultation no wheezes or rales.   . Abdomen: Bowel sounds present.  Nondistended.  Mild ecchymosis to left sided abdominal quadrant.   . Musculoskeletal: No lower extremity edema bilaterally.   Marland Kitchen Psychiatry: Mood is appropriate.  Data Reviewed: CBC: Recent Labs  Lab 03/12/20 1846 03/13/20 0525 03/14/20 0717 03/15/20 0513 03/16/20 1105 03/18/20 0557 03/19/20 1012  WBC 23.0*   < > 12.1* 7.0 9.7 9.5 11.8*  NEUTROABS 18.9*  --  9.0* 4.8 7.8*  --   --   HGB 9.0*   < > 10.7* 10.4* 10.3* 9.9* 11.0*  HCT 28.2*   < > 31.7* 32.1* 32.2* 30.1* 32.9*  MCV 79.7*   < > 84.5 85.4 86.3 85.0 82.9  PLT 484*   < > 331 322 389 354 354   < > = values in this interval not displayed.   Basic Metabolic Panel: Recent Labs  Lab 03/16/20 1105 03/17/20 0604 03/17/20 1220 03/17/20 1520 03/18/20 0557 03/18/20 1614 03/18/20 1619 03/19/20 1012  NA 126*   < > 126* 127* 128* 128*  --  129*  K 4.4  --  3.7  --  3.2* 3.1*  --  3.8  CL 100  --  103  --  104 102  --  103  CO2 11*  --  11*  --  18* 18*  --  19*  GLUCOSE 144*  --  168*  --  153* 255*  --  256*  BUN 8  --  5*  --  <5* <5*  --  <5*  CREATININE 0.80  --  0.80  --  0.54 0.63  --  0.56  CALCIUM 7.4*  --  7.6*  --  7.4* 7.2*  --  7.4*  MG  --   --   --   --   --   --  1.1* 1.1*   < > = values in this interval not displayed.   GFR: Estimated Creatinine Clearance: 48.2 mL/min (by C-G formula based on SCr of 0.56 mg/dL). Liver Function Tests: Recent Labs  Lab 03/12/20 1846 03/16/20 1105 03/17/20 1220  AST 21 19 12*  ALT 22 15 11   ALKPHOS 150* 101 91  BILITOT 0.5 1.4* 1.4*  PROT 5.7* 5.1* 5.0*   ALBUMIN 2.8* 2.4* 2.3*   Recent Labs  Lab 03/12/20 1846  LIPASE 28   No results for input(s): AMMONIA in the last 168 hours. Coagulation Profile: Recent Labs  Lab 03/12/20 2010  INR 1.0   Cardiac Enzymes: No results for input(s): CKTOTAL, CKMB, CKMBINDEX, TROPONINI in the last 168 hours. BNP (last 3 results) No results for input(s): PROBNP in the last 8760 hours. HbA1C: No results for input(s): HGBA1C in the last 72 hours. CBG: Recent Labs  Lab 03/18/20 1127 03/18/20 1718 03/18/20 1954 03/19/20 0759 03/19/20 1235  GLUCAP 173* 272* 277* 183*  199*   Lipid Profile: No results for input(s): CHOL, HDL, LDLCALC, TRIG, CHOLHDL, LDLDIRECT in the last 72 hours. Thyroid Function Tests: No results for input(s): TSH, T4TOTAL, FREET4, T3FREE, THYROIDAB in the last 72 hours. Anemia Panel: No results for input(s): VITAMINB12, FOLATE, FERRITIN, TIBC, IRON, RETICCTPCT in the last 72 hours. Urine analysis:    Component Value Date/Time   COLORURINE YELLOW 03/13/2020 1112   APPEARANCEUR CLEAR 03/13/2020 1112   LABSPEC 1.013 03/13/2020 1112   PHURINE 5.0 03/13/2020 1112   GLUCOSEU 50 (A) 03/13/2020 1112   GLUCOSEU NEGATIVE 12/28/2013 1036   HGBUR NEGATIVE 03/13/2020 1112   HGBUR trace-intact 06/29/2007 1304   BILIRUBINUR NEGATIVE 03/13/2020 1112   BILIRUBINUR negative 01/19/2017 1049   KETONESUR 5 (A) 03/13/2020 1112   PROTEINUR NEGATIVE 03/13/2020 1112   UROBILINOGEN 0.2 01/19/2017 1049   UROBILINOGEN 1.0 03/25/2015 1755   NITRITE NEGATIVE 03/13/2020 1112   LEUKOCYTESUR NEGATIVE 03/13/2020 1112   Sepsis Labs: @LABRCNTIP (procalcitonin:4,lacticidven:4)  ) Recent Results (from the past 240 hour(s))  Blood Culture (routine x 2)     Status: None   Collection Time: 03/12/20  8:00 PM   Specimen: BLOOD  Result Value Ref Range Status   Specimen Description   Final    BLOOD RIGHT ANTECUBITAL Performed at [name] [address], [place], [place]  [address]    Special Requests   Final    BOTTLES DRAWN AEROBIC AND ANAEROBIC Blood Culture adequate volume Performed at [facility] [address] [place], [place] [address]    Culture   Final    NO GROWTH 5 DAYS Performed at [facility], [place] [address] [place], [place] [address]    Report Status 03/17/2020 FINAL  Final  Blood Culture (routine x 2)     Status: None   Collection Time: 03/12/20  8:10 PM   Specimen: BLOOD RIGHT FOREARM  Result Value Ref Range Status   Specimen Description   Final    BLOOD RIGHT FOREARM Performed at [facility] [address] [place], [place] [address]    Special Requests   Final    BOTTLES DRAWN AEROBIC AND ANAEROBIC Blood Culture adequate volume Performed at [facility] [address], [place], [place] [address]    Culture   Final    NO GROWTH 5 DAYS Performed at [facility], [place] [address], [place], [place] [address]    Report Status 03/17/2020 FINAL  Final  Respiratory Panel by RT PCR (Flu A&B, Covid) - Nasopharyngeal Swab     Status: None   Collection Time: 03/12/20  8:42 PM   Specimen: Nasopharyngeal Swab  Result Value Ref Range Status   SARS Coronavirus 2 by RT PCR NEGATIVE NEGATIVE Final    Comment: (NOTE) SARS-CoV-2 target nucleic acids are NOT DETECTED.  The SARS-CoV-2 RNA is generally detectable in upper respiratoy specimens during the acute phase of infection. The lowest concentration of SARS-CoV-2 viral copies this assay can detect is 131 copies/mL. A negative result does not preclude SARS-Cov-2 infection and should not be used as the sole basis for treatment or other patient management decisions. A negative result Badal occur with  improper specimen collection/handling, submission of specimen other than nasopharyngeal swab, presence of viral mutation(s) within the areas targeted by this assay, and inadequate number of viral copies (<131 copies/mL). A  negative result must be combined with clinical observations, patient history, and epidemiological information. The expected result is Negative.  Fact  Sheet for Patients:  PinkCheek.be  Fact Sheet for Healthcare Providers:  GravelBags.it  This test is no t yet approved or cleared by the [place] FDA and  has been authorized for detection and/or diagnosis of SARS-CoV-2 by FDA under an Emergency Use Authorization (EUA). This EUA will remain  in effect (meaning this test can be used) for the duration of the COVID-19 declaration under Section 564(b)(1) of the Act, 21 U.S.C. section 360bbb-3(b)(1), unless the authorization is terminated or revoked sooner.     Influenza A by PCR NEGATIVE NEGATIVE Final   Influenza B by PCR NEGATIVE NEGATIVE Final    Comment: (NOTE) The Xpert Xpress SARS-CoV-2/FLU/RSV assay is intended as an aid in  the diagnosis of influenza from Nasopharyngeal swab specimens and  should not be used as a sole basis for treatment. Nasal washings and  aspirates are unacceptable for Xpert Xpress SARS-CoV-2/FLU/RSV  testing.  Fact Sheet for Patients: PinkCheek.be  Fact Sheet for Healthcare Providers: GravelBags.it  This test is not yet approved or cleared by the [place] FDA and  has been authorized for detection and/or diagnosis of SARS-CoV-2 by  FDA under an Emergency Use Authorization (EUA). This EUA will remain  in effect (meaning this test can be used) for the duration of the  Covid-19 declaration under Section 564(b)(1) of the Act, 21  U.S.C. section 360bbb-3(b)(1), unless the authorization is  terminated or revoked. Performed at [facility], [place] [address], [place], [place] [address]   Urine culture     Status: None   Collection Time: 03/13/20 11:12 AM   Specimen: In/Out Cath Urine  Result Value Ref Range Status    Specimen Description   Final    IN/OUT CATH URINE Performed at [facility] [address] [place], [place] [address]    Special Requests   Final    NONE Performed at [facility], [place] [address] [place], [place] [address]    Culture   Final    NO GROWTH Performed at [facility], [place] [address] [place], [place] [address]    Report Status 03/14/2020 FINAL  Final      Studies: No results found.  Scheduled Meds: . acetaminophen  650 mg Oral Q6H  . cholecalciferol  5,000 Units Oral Daily  . [START ON 03/26/2020] cyanocobalamin  1,000 mcg Intramuscular Q21 days  . enoxaparin (LOVENOX) injection  40 mg Subcutaneous Q24H  . escitalopram  10 mg Oral Daily  . feeding supplement (GLUCERNA SHAKE)  237 mL Oral TID BM  . ibuprofen  400 mg Oral Q6H  . insulin glargine  10 Units Subcutaneous BID  . levothyroxine  75 mcg Oral QAC breakfast  . magnesium oxide  800 mg Oral BID  . metoCLOPramide  5 mg Oral TID AC  . phenytoin  100 mg Oral Daily   And  . phenytoin  50 mg Oral Daily  . phenytoin  200 mg Oral QHS  . primidone  150 mg Oral Daily   And  . primidone  100 mg Oral QHS  . sodium chloride  2 g Oral BID WC  . vitamin B-1  250 mg Oral QHS  . trandolapril  2 mg Oral QHS   And  . verapamil  240 mg Oral QHS    Continuous Infusions: . calcium gluconate Stopped (03/17/20 1029)  . magnesium sulfate bolus IVPB 4 g (03/19/20 1124)     LOS: 7 days     Archie Patten  Malachi Carl, MD [facility] Pager [phone]  If 7PM-7AM, please contact night-coverage [email or web address] Password [facility] 03/19/2020, 12:36 PM

## 2020-03-19 NOTE — Progress Notes (Signed)
Serum magnesium is currently at goal 2.1.  I have discontinued additional Mg2+ supplements.  Encourage to increase po intake, continue feeding supplement, Glucerna shake.

## 2020-03-19 NOTE — Progress Notes (Signed)
Pt is refusing tylenol; Pt states "It doesn't do anything anyway."

## 2020-03-19 NOTE — Progress Notes (Signed)
Pt is refusing all meds except for neurontin, bp meds, and potassium.  Pt continues to take medications for pain, nausea, and itching.

## 2020-03-20 ENCOUNTER — Other Ambulatory Visit: Payer: Self-pay

## 2020-03-20 DIAGNOSIS — R112 Nausea with vomiting, unspecified: Secondary | ICD-10-CM | POA: Diagnosis not present

## 2020-03-20 LAB — SARS CORONAVIRUS 2 BY RT PCR (HOSPITAL ORDER, PERFORMED IN ~~LOC~~ HOSPITAL LAB): SARS Coronavirus 2: NEGATIVE

## 2020-03-20 LAB — GLUCOSE, CAPILLARY
Glucose-Capillary: 81 mg/dL (ref 70–99)
Glucose-Capillary: 150 mg/dL — ABNORMAL HIGH (ref 70–99)
Glucose-Capillary: 241 mg/dL — ABNORMAL HIGH (ref 70–99)
Glucose-Capillary: 186 mg/dL — ABNORMAL HIGH (ref 70–99)

## 2020-03-20 MED ORDER — LOPERAMIDE HCL 2 MG PO CAPS
2.0000 mg | ORAL_CAPSULE | Freq: Two times a day (BID) | ORAL | 0 refills | Status: DC | PRN
Start: 2020-03-20 — End: 2020-03-20

## 2020-03-20 MED ORDER — LOPERAMIDE HCL 2 MG PO CAPS
2.0000 mg | ORAL_CAPSULE | Freq: Two times a day (BID) | ORAL | Status: DC
  Administered 2020-03-20 – 2020-03-21 (×3): 2 mg via ORAL
  Filled 2020-03-20 (×3): qty 1

## 2020-03-20 MED ORDER — FLUCONAZOLE 150 MG PO TABS
150.0000 mg | ORAL_TABLET | Freq: Once | ORAL | Status: AC
  Administered 2020-03-20: 150 mg via ORAL
  Filled 2020-03-20: qty 1

## 2020-03-20 NOTE — TOC Progression Note (Signed)
Transition of Care [facility]) - Progression Note    Patient Details  Name: Kieana Livesay Wessells MRN: [id number] Date of Birth: Finnicum 21, 1931  Transition of [facility]) CM/SW Contact  Dell Hurtubise, Juliann Pulse, RN Phone Number: 03/20/2020, 2:03 PM  Clinical Narrative: Patient has changed facility of choice to [facility] will have a bed available tomorrow-MD updated.      Expected Discharge Plan: [place] Barriers to Discharge: No Barriers Identified  Expected Discharge Plan and Services Expected Discharge Plan: [place] In-house Referral: Clinical Social Work Discharge Planning Services: CM Consult Post Acute Care Choice: [place] [facility] [place] arrangements for the past 2 months: Single Family Home Expected Discharge Date: 03/20/20                                     Social Determinants of Health (SDOH) Interventions    Readmission Risk Interventions Readmission Risk Prevention Plan 03/15/2020 03/13/2020  Transportation Screening Complete -  PCP or Specialist Appt within 3-5 Days Complete Complete  HRI or [place] - Complete  Social Work Consult for [place] Planning/Counseling Complete Complete  Palliative Care Screening Complete Complete  Medication Review Press photographer) Complete Complete  Some recent data might be hidden

## 2020-03-20 NOTE — Discharge Summary (Addendum)
Patient ID: [name] MRN: [id number] DOB/AGE: Mar 26, 1930 86 y.o.  Admit date: 03/12/2020 Discharge date: 03/21/20  Discharge Diagnoses Patient Active Problem List   Diagnosis Date Noted  . Nausea & vomiting 03/12/2020  . Cancer of ascending colon pT3pN0 (0/12 LN) s/p lap right colectomy 03/06/2020 03/09/2020  . History of multiple allergies 03/09/2020  . S/P right hemicolectomy 03/06/2020  . Closed fracture of shaft of tibia 02/12/2020  . Gait abnormality 12/04/2019  . Impaired left ventricular function 09/05/2019  . Hypokalemia   . Effusion of left knee joint   . Hyponatremia   . Left patella fracture 10/26/2018  . Fracture of patella 10/26/2018  . Acquired hypothyroidism 08/25/2017  . 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 Hammer toe 08/25/2017  . History of breast cancer 08/25/2017  . Nontoxic thyroid nodule 08/25/2017  . Osteopetrosis 08/25/2017  . Dysuria 01/19/2017  . Vitamin D deficiency 11/18/2016  . Tremor, essential 08/18/2016  . Convulsions/seizures ([place]) 10/16/2014  . Brain tumor (benign) ([place]) 10/18/2013  . Subdural hemorrhage ([place]) 10/09/2013  . Anxiety 10/09/2013  . Compression fracture of T12 vertebra ([place]) 10/09/2013  . Nontoxic multinodular goiter 09/05/2013  . Syncope 08/27/2013  . Carotid artery disease ([place]) 08/27/2013  . Abnormal thyroid ultrasound 08/27/2013  . Cancer of central portion of female breast ([place]) 12/30/2012  . Osteoporosis 03/06/2010  . ASTHMA 03/05/2009  . Asthma 03/05/2009  . IRRITABLE BOWEL SYNDROME  diarrhea type 03/21/2008  . ANEMIA-NOS 12/16/2006  . GERD 12/16/2006  . Insulin-requiring or dependent type II diabetes mellitus ([place]) 10/29/2006  . Mixed hyperlipidemia 10/29/2006  . Essential hypertension 10/29/2006  . Allergic rhinitis, cause unspecified 10/29/2006  . OSTEOARTHRITIS 10/29/2006    Consultants Internal medicine (Hospitalist service)  Hospital Course: She was readmitted with findings consistent  with a mild/moderate amount of hemoperitoneum - likely related to LLQ port site as there was some bruising at this location. A CT was repeated the morning of admission with PO contrast. Although it didn't fully opacify the anastomosis there were no other progressive findgins or extraluminal air to suggest an enteric leak. She was admitted and monitored. Acute blood loss anemia managed with 2 U PRBC transfusion and she responded well. She had an expected ileus in setting of some intra-abdominal blood that slowly resolved. Her diet was gradually advanced. Internal medicine was consulted for assistince in care and managing electrolyte distrubances - see their notes for details. On 03/20/20, she was tolerating a diet without issue, maintaining hydration and taking Glucerna supplements. She was having good reliable bowel function. Discharging to SNF today   Allergies as of 03/21/2020      Reactions   Bystolic [nebivolol Hcl] Other (See Comments)   "extreme weakness, heaviness in legs & arms, swelling in legs/arms/face, swollen abdomen, pain in bladder, feet pain, soreness in chest"   Cholestyramine    "itching rash on stomach, bloated, nausea, vomiting, sleeplessness, extreme pain in arms"   Hydrazine Yellow [tartrazine] Other (See Comments)   "does not reduce high blood pressure, pain in arm, high pressure, felt like I was on verge of heart attack, really weak"   Morphine Other (See Comments)   Feels morbid, weak, still in pain   Niacin Palpitations   Fast heart beat   Niaspan [niacin Er] Palpitations, Other (See Comments)   "fast heart beat, high blood pressure"   Norvasc [amlodipine Besylate] Other (See Comments)   "extreme fluid retention/pain)   Optivar [azelastine Hcl] Photosensitivity   Repaglinide Hives  Sular [nisoldipine Er] Other (See Comments)   "severe headaches, swelling eyes, hands, feet, shortness of breath, weak, flushed face, brain boiling, fluid retention, high blood sugar,  nervous, heart fast beating"   Tegretol [carbamazepine] Other (See Comments)   Blood poisoning   Telmisartan Other (See Comments)   "headache, difficulty urinating, high blood sugar, fluid retention"   Amoxicillin-pot Clavulanate Rash   Cefdinir Swelling   Vaginal irritation, breathing,    Ciprofloxacin Hcl Hives   Clonidine Other (See Comments)   Dry mouth, fluid retention   Clonidine Hydrochloride Other (See Comments)   Dry mouth, fluid retention   Codeine Nausea And Vomiting   Ezetimibe Other (See Comments)   Made weak   Hydroxychloroquine Other (See Comments)   Low platlets   Naproxen Other (See Comments)   Shrinks bladder   Sulfa Antibiotics Rash   Ziac [bisoprolol-hydrochlorothiazide] Other (See Comments)   "stopped urination"   Azelastine Other (See Comments)   Elavil [amitriptyline] Other (See Comments)   Gave Pt nightmares   Empagliflozin Other (See Comments)   "Caused yeast infection, slowed my urine"   Hydralazine    Iodine    Kenalog [triamcinolone] Diarrhea   Keppra [levetiracetam] Other (See Comments)   Shaking   Lamictal [lamotrigine]    itching   Lyrica [pregabalin] Swelling   Pseudoephedrine    Topamax [topiramate]    Dry eyes   Ace Inhibitors Other (See Comments)   unknown   Actonel [risedronate Sodium] Other (See Comments)   unknown   Amlodipine Besylate Other (See Comments)   unknown   Aspirin Other (See Comments)   unknown   Atacand [candesartan] Other (See Comments)   unknown   Bextra [valdecoxib] Other (See Comments)   unknown   Bisoprolol-hydrochlorothiazide Other (See Comments)   unknown   Candesartan Cilexetil Other (See Comments)   unknown   Cefadroxil Other (See Comments)   unknown   Celecoxib Rash   Hydrocodone Other (See Comments)   unknown   Hydrocodone-acetaminophen Other (See Comments)   unknown   Iodinated Diagnostic Agents Rash   "All over" "All over"   Meloxicam Other (See Comments)   unknown   Methylprednisolone  Sodium Succinate Other (See Comments)   unknown   Nabumetone Other (See Comments)   unknown   Penicillins Other (See Comments)   unknown   Pseudoephedrine-guaifenesin Other (See Comments)   unknown   Risedronate Sodium Other (See Comments)   unknown   Rofecoxib Other (See Comments)   unknown   Ru-tuss [chlorphen-pse-atrop-hyos-scop] Other (See Comments)   unknown   Sulfonamide Derivatives Other (See Comments)   unknown   Sulphur [sulfur] Other (See Comments)   unknown   Telithromycin Other (See Comments)   unknown   Terfenadine Other (See Comments)   unknown   Trandolapril-verapamil Hcl Er Other (See Comments)   Headache, difficulty urinating, high blood sugar, fluid retention Pt is taking Tarka (trandolapril-verapamil) currently, but requests the medication stay in her allergy list   Trandolapril-verapamil Hcl Er Other (See Comments)   Headache, difficulty urinating, high blood sugar, fluid retention Pt is taking Tarka (trandolapril-verapamil) currently, but requests the medication stay in her allergy list   Valium [diazepam] Other (See Comments)   Makes her mean and hyper      Medication List    TAKE these medications   ALPRAZolam 0.25 MG tablet Commonly known as: XANAX TAKE 1 TABLET AT BEDTIME AS NEEDED (CAN TAKE 1 TABLET DURING THE DAY IF NEEDED FOR ANXIETY) What changed:   how  much to take  how to take this  when to take this  reasons to take this  additional instructions   AZO CRANBERRY PO Take 1 tablet by mouth daily.   Biotin 1000 MCG tablet Take 1,000 mcg by mouth daily.   cholestyramine light 4 g packet Commonly known as: PREVALITE Take 0.5 packets by mouth in the morning and at bedtime.   cyanocobalamin 1000 MCG/ML injection Commonly known as: (VITAMIN B-12) INJECT 1 ML INTRAMUSCULARLY EVERY 21 DAYS What changed: See the new instructions.   diclofenac Sodium 1 % Gel Commonly known as: VOLTAREN Apply 2 g topically 4 (four) times daily as  needed (pain.).   diphenhydrAMINE 25 MG tablet Commonly known as: BENADRYL Take 25-50 mg by mouth every 6 (six) hours as needed (runny nose/allergies.).   docusate sodium 100 MG capsule Commonly known as: COLACE Take 200 mg by mouth 2 (two) times daily as needed (constipation.).   escitalopram 10 MG tablet Commonly known as: LEXAPRO Take 10 mg by mouth daily.   glucose blood test strip Use to check blood sugar 1 time per day.   insulin aspart 100 UNIT/ML FlexPen Commonly known as: NOVOLOG Inject 2-3 Units into the skin 3 (three) times daily as needed (blood sugars greater than 150=2 units & 200=3 units). Sliding Scale Insulin   insulin glargine 100 UNIT/ML injection Commonly known as: LANTUS Inject 14 Units into the skin at bedtime.   Insulin Pen Needle 32G X 4 MM Misc Used to inject insulin 3x daily   levothyroxine 75 MCG tablet Commonly known as: SYNTHROID Take 75 mcg by mouth daily before breakfast.   lidocaine 5 % Commonly known as: LIDODERM Place 1 patch onto the skin daily. Remove & Discard patch within 12 hours or as directed by MD What changed:   how much to take  when to take this  reasons to take this   ondansetron 4 MG disintegrating tablet Commonly known as: ZOFRAN-ODT Take 1 tablet (4 mg total) by mouth every 6 (six) hours as needed for nausea.   oxyCODONE 5 MG immediate release tablet Commonly known as: Roxicodone Take 1 tablet (5 mg total) by mouth every 6 (six) hours as needed for up to 5 days (severe pain not controlled with tylenol first - do not take with tramadol).   phenytoin 100 MG ER capsule Commonly known as: DILANTIN Take 1 capsule (100 mg) in the morning and Take 2 capsules (200 mg) at bedtime What changed:   how much to take  how to take this  when to take this  additional instructions   phenytoin 50 MG tablet Commonly known as: DILANTIN Chew 50 mg by mouth daily. Total morning dose=150 mg   primidone 50 MG  tablet Commonly known as: MYSOLINE Three tablets in the morning and one at dinner time What changed:   how much to take  how to take this  when to take this  additional instructions   Prodigy Voice Blood Glucose w/Device Kit Use to check blood sugar 1 time per day.   silver sulfADIAZINE 1 % cream Commonly known as: SILVADENE Apply 1 application topically daily as needed (itching.).   SYSTANE OP Place 1 drop into both eyes in the morning, at noon, in the evening, and at bedtime.   trandolapril-verapamil 2-240 MG tablet Commonly known as: TARKA Take 1 tablet by mouth at bedtime.   vitamin B-1 250 MG tablet Take 250 mg by mouth at bedtime.   Vitamin D-3 125 MCG (5000  UT) Tabs Take 5,000 Units by mouth in the morning and at bedtime.         Contact information for follow-up providers    Primary Care Physician. Schedule an appointment as soon as possible for a visit.        Ileana Roup, MD Follow up in 2 week(s).   Specialty: General Surgery Contact information: [place] [address] [phone]            Contact information for after-discharge care    Destination    [facility] Preferred SNF .   Service: Skilled Nursing Contact information: [address] [place] Dema Severin, M.D. [facility]    ADDENDUM At request for clarification this has been added Sepsis ruled out  Nadeen Landau, M.D. [facility]

## 2020-03-20 NOTE — Consult Note (Signed)
   [facility] Inpatient Consult   03/20/2020  [name] 09/04/1929 [id number]   [facility] Follow up:  Per chart review, current disposition plan is for SNF. No [facility] Care Management needs at this time.  [name], MSN, [facility] Liaison Nurse Mobile Phone [phone]  Toll free office [phone]

## 2020-03-20 NOTE — Progress Notes (Addendum)
Subjective In great spirits. Denies nausea/vomiting. Passing flatus and having good bowel function - 3 BMs in last 24 hrs.  Objective: Vital signs in last 24 hours: Temp:  [97.4 F (36.3 C)-98.8 F (37.1 C)] 97.4 F (36.3 C) (11/17 0545) Pulse Rate:  [78-88] 78 (11/17 0545) Resp:  [11-20] 16 (11/17 0545) BP: (138-146)/(78-83) 142/83 (11/17 0545) SpO2:  [93 %-97 %] 95 % (11/17 0545) Weight:  [70.8 kg] 70.8 kg (11/17 0600) Last BM Date: 03/19/20  Intake/Output from previous day: 11/16 0701 - 11/17 0700 In: 500 [P.O.:400; IV Piggyback:100] Out: 1300 [Urine:1300] Intake/Output this shift: Total I/O In: 240 [P.O.:240] Out: -   Gen: NAD, comfortable, mentation appropriate - A&O x3 CV: RRR Pulm: Normal work of breathing Abd: Soft, not significantly tender throughout; incisions c/d/i without erythema or drainage. Ecchymosis LLQ port site Ext: SCDs in place  Lab Results: CBC  Recent Labs    03/18/20 0557 03/19/20 1012  WBC 9.5 11.8*  HGB 9.9* 11.0*  HCT 30.1* 32.9*  PLT 354 354   BMET Recent Labs    03/18/20 1614 03/19/20 1012  NA 128* 129*  K 3.1* 3.8  CL 102 103  CO2 18* 19*  GLUCOSE 255* 256*  BUN <5* <5*  CREATININE 0.63 0.56  CALCIUM 7.2* 7.4*   PT/INR No results for input(s): LABPROT, INR in the last 72 hours. ABG No results for input(s): PHART, HCO3 in the last 72 hours.  Invalid input(s): PCO2, PO2  Studies/Results:  Anti-infectives: Anti-infectives (From admission, onward)   Start     Dose/Rate Route Frequency Ordered Stop   03/14/20 1630  fluconazole (DIFLUCAN) tablet 150 mg  Status:  Discontinued        150 mg Oral  Once 03/14/20 1541 03/19/20 0616   03/13/20 2200  meropenem (MERREM) 1 g in sodium chloride 0.9 % 100 mL IVPB  Status:  Discontinued        1 g 200 mL/hr over 30 Minutes Intravenous Every 12 hours 03/13/20 1015 03/15/20 0923   03/13/20 0915  meropenem (MERREM) 1 g in sodium chloride 0.9 % 100 mL IVPB  Status:  Discontinued         1 g 200 mL/hr over 30 Minutes Intravenous Every 8 hours 03/13/20 0854 03/13/20 1015   03/12/20 2200  meropenem (MERREM) 1 g in sodium chloride 0.9 % 100 mL IVPB        1 g 200 mL/hr over 30 Minutes Intravenous  Once 03/12/20 2150 03/12/20 2355   03/12/20 1945  metroNIDAZOLE (FLAGYL) IVPB 500 mg        500 mg 100 mL/hr over 60 Minutes Intravenous  Once 03/12/20 1936 03/12/20 2133   03/12/20 1945  vancomycin (VANCOCIN) IVPB 1000 mg/200 mL premix        1,000 mg 200 mL/hr over 60 Minutes Intravenous  Once 03/12/20 1936 03/12/20 2133       Assessment/Plan: Patient Active Problem List   Diagnosis Date Noted  . Nausea & vomiting 03/12/2020  . Cancer of ascending colon pT3pN0 (0/12 LN) s/p lap right colectomy 03/06/2020 03/09/2020  . History of multiple allergies 03/09/2020  . S/P right hemicolectomy 03/06/2020  . Closed fracture of shaft of tibia 02/12/2020  . Gait abnormality 12/04/2019  . Impaired left ventricular function 09/05/2019  . Hypokalemia   . Effusion of left knee joint   . Hyponatremia   . Left patella fracture 10/26/2018  . Fracture of patella 10/26/2018  . Acquired hypothyroidism 08/25/2017  . H/O excision of  tumor of brain meninges 08/25/2017  . Hammer toe 08/25/2017  . History of breast cancer 08/25/2017  . Nontoxic thyroid nodule 08/25/2017  . Osteopetrosis 08/25/2017  . Dysuria 01/19/2017  . Vitamin D deficiency 11/18/2016  . Tremor, essential 08/18/2016  . Convulsions/seizures ([place]) 10/16/2014  . Brain tumor (benign) ([place]) 10/18/2013  . Subdural hemorrhage ([place]) 10/09/2013  . Anxiety 10/09/2013  . Compression fracture of T12 vertebra ([place]) 10/09/2013  . Nontoxic multinodular goiter 09/05/2013  . Syncope 08/27/2013  . Carotid artery disease ([place]) 08/27/2013  . Abnormal thyroid ultrasound 08/27/2013  . Cancer of central portion of female breast ([place]) 12/30/2012  . Osteoporosis 03/06/2010  . ASTHMA 03/05/2009  . Asthma 03/05/2009  . IRRITABLE BOWEL  SYNDROME  diarrhea type 03/21/2008  . ANEMIA-NOS 12/16/2006  . GERD 12/16/2006  . Insulin-requiring or dependent type II diabetes mellitus ([place]) 10/29/2006  . Mixed hyperlipidemia 10/29/2006  . Essential hypertension 10/29/2006  . Allergic rhinitis, cause unspecified 10/29/2006  . OSTEOARTHRITIS 10/29/2006   -Soft diet + supplements - prefers chocolate flavor glucerna -Eating well - smaller more frequent meals; no emesis. Tolerating glucerna as well -D/C'd miralax - not sure why this was started but it's certainly contributing to looser stool -PPx: SCDs, SQH  -Dispo: requesting [name] if able - cleared medically for discharge. Rapid covid test ordered; if negative, should be able to go later today  Nadeen Landau, MD [facility] Use [email or web address] to contact on call provider

## 2020-03-20 NOTE — Progress Notes (Signed)
Nutrition Follow-up  DOCUMENTATION CODES:   Not applicable  INTERVENTION:  - continue Glucerna Shake TID. - will monitor for additional needs if patient unable to d/c today.   NUTRITION DIAGNOSIS:   Increased nutrient needs related to acute illness, cancer and cancer related treatments, post-op healing as evidenced by estimated needs. -ongoing  GOAL:   Patient will meet greater than or equal to 90% of their needs -unmet on average   MONITOR:   PO intake, Supplement acceptance, Labs, Weight trends  ASSESSMENT:   86 y.o. female with medical history of chronic hyponatremia, type 2 DM, hypothyroidism, HTN, hyperlipidemia, left tib/fib fracture, breast cancer, brain tumor s/p surgery complicated with seizures, and colon cancer. She was hospitalized 11/3-11/9 for lap R hemicolectomy with extensive adhesions on 11/3 due to colon cancer. She returned to the ED due to persistent generalized weakness, nausea, and abdominal pain. In the ED, she was found to have SIRS and CT showed mild small bowel dilatation and fluid consistent with blood.  Diet advanced from NPO to CLD on 11/12 at 70 and then to Soft on 11/14 at [facility]. She ate 50% of breakfast and 25% of lunch on 11/15; 0% of dinner yesterday; 50% of breakfast this AM.  Glucerna Shake was ordered TID on 11/15 and patient has accepted this supplement 4 of the 6 times offered.   Weight today is +5.8 kg/13 lb compared to weight documented on admission date (11/9). No information documented in the edema section of flow sheet.   Discharge order and discharge summary entered for discharge to SNF pending negative COVID test.     Labs reviewed; CBGs: 81 and 150 mg/dl, Na: 129 mmol/l, BUN: <5 mg/dl, Ca: 7.4 mg/dl. Medications reviewed; 5000 units cholecalciferol/day, 10 unist lantus BID, 75 mcg oral synthroif/day, 2 mg imodium BID, 250 mg thiamine/day.    Diet Order:   Diet Order            DIET SOFT Room service appropriate? Yes; Fluid  consistency: Thin  Diet effective now                 EDUCATION NEEDS:   No education needs have been identified at this time  Skin:  Skin Assessment: Reviewed RN Assessment  Last BM:  11/17  Height:   Ht Readings from Last 1 Encounters:  03/12/20 5' 7.5" (1.715 m)    Weight:   Wt Readings from Last 1 Encounters:  03/20/20 70.8 kg     Estimated Nutritional Needs:  Kcal:  1820-1950 kcal Protein:  90-100 grams Fluid:  >/= 2 L/day     Yolanda Matin, MS, RD, LDN, CNSC Inpatient Clinical Dietitian RD pager # available in AMION  After hours/weekend pager # available in [facility]

## 2020-03-20 NOTE — NC FL2 (Signed)
[place] LEVEL OF CARE SCREENING TOOL     IDENTIFICATION  Patient Name: [name] Birthdate: 09-Mar-1930 Sex: female Admission Date (Current Location): 03/12/2020  [facility] and [place] Number:  Herbalist and Address:  [facility],  [place] [address], [place]      Provider Number: [id number]  Attending Physician Name and Address:  Ileana Roup, MD  Relative Name and Phone Number:       Current Level of Care: Hospital Recommended Level of Care: [place] Prior Approval Number:    Date Approved/Denied:   PASRR Number: [id number]  Discharge Plan: SNF    Current Diagnoses: Patient Active Problem List   Diagnosis Date Noted  . Nausea & vomiting 03/12/2020  . Cancer of ascending colon pT3pN0 (0/12 LN) s/p lap right colectomy 03/06/2020 03/09/2020  . History of multiple allergies 03/09/2020  . S/P right hemicolectomy 03/06/2020  . Closed fracture of shaft of tibia 02/12/2020  . Gait abnormality 12/04/2019  . Impaired left ventricular function 09/05/2019  . Hypokalemia   . Effusion of left knee joint   . Hyponatremia   . Left patella fracture 10/26/2018  . Fracture of patella 10/26/2018  . Acquired hypothyroidism 08/25/2017  . 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 Hammer toe 08/25/2017  . History of breast cancer 08/25/2017  . Nontoxic thyroid nodule 08/25/2017  . Osteopetrosis 08/25/2017  . Dysuria 01/19/2017  . Vitamin D deficiency 11/18/2016  . Tremor, essential 08/18/2016  . Convulsions/seizures ([place]) 10/16/2014  . Brain tumor (benign) ([place]) 10/18/2013  . Subdural hemorrhage ([place]) 10/09/2013  . Anxiety 10/09/2013  . Compression fracture of T12 vertebra ([place]) 10/09/2013  . Nontoxic multinodular goiter 09/05/2013  . Syncope 08/27/2013  . Carotid artery disease ([place]) 08/27/2013  . Abnormal thyroid ultrasound 08/27/2013  . Cancer of central portion of female breast ([place])  12/30/2012  . Osteoporosis 03/06/2010  . ASTHMA 03/05/2009  . Asthma 03/05/2009  . IRRITABLE BOWEL SYNDROME  diarrhea type 03/21/2008  . ANEMIA-NOS 12/16/2006  . GERD 12/16/2006  . Insulin-requiring or dependent type II diabetes mellitus ([place]) 10/29/2006  . Mixed hyperlipidemia 10/29/2006  . Essential hypertension 10/29/2006  . Allergic rhinitis, cause unspecified 10/29/2006  . OSTEOARTHRITIS 10/29/2006    Orientation RESPIRATION BLADDER Height & Weight     Self, Time, Situation, Place  Normal Incontinent Weight: 70.8 kg Height:  5' 7.5" (171.5 cm)  BEHAVIORAL SYMPTOMS/MOOD NEUROLOGICAL BOWEL NUTRITION STATUS      Continent Diet (see dc summary)  AMBULATORY STATUS COMMUNICATION OF NEEDS Skin   Extensive Assist Verbally PU Stage and Appropriate Care (sacrum)                       Personal Care Assistance Level of Assistance  Bathing, Feeding, Dressing Bathing Assistance: Limited assistance Feeding assistance: Independent Dressing Assistance: Limited assistance     Functional Limitations Info  Sight, Hearing, Speech Sight Info: Adequate Hearing Info: Adequate Speech Info: Adequate    SPECIAL CARE FACTORS FREQUENCY  PT (By licensed PT), OT (By licensed OT)     PT Frequency: 5x week OT Frequency: 3x week            Contractures Contractures Info: Not present    Additional Factors Info  Code Status, Allergies, Psychotropic Code Status Info: Full Allergies Info: Bystolic Nebivolol HclCholestyramine Hydrazine Yellow TartrazineMorphine Niacin Niaspan Niacin ErNorvasc Amlodipine Besylate Optivar Azelastine Hcl Repaglinide Sular Nisoldipine ErTegretol Carbamazepine Telmisartan Amoxicillin-pot Clavulanate Cefdinir  Ciprofloxacin Hcl Clonidine Clonidine Hydrochloride Codeine Ezetimibe Hydroxychloroquine Naproxen Sulfa Antibiotics Ziac Bisoprolol-hydrochlorothiazide Azelastine Elavil AmitriptylineEmpagliflozin Hydralazine Iodine Kenalog TriamcinoloneKeppra Levetiracetam  Lamictal Lamotrigine Lyrica Pregabalin Pseudoephedrine Topamax TopiramateAce Inhibitors Actonel Risedronate SodiumAmlodipine Besylate Aspirin Atacand Candesartan Bextra Valdecoxib Bisoprolol-hydrochlorothiazide Candesartan Cilexetil Cefadroxil Celecoxib Hydrocodone Hydrocodone-acetaminophen Iodinated Diagnostic Agents Meloxicam Methylprednisolone Sodium Succinate Nabumetone Penicillins Pseudoephedrine-guaifenesin Risedronate Sodium Rofecoxib Ru-tuss Chlorphen-pse-atrop-hyos-scopSulfonamide Derivatives Sulphur Sulfur Telithromycin Terfenadine Trandolapril-verapamil Hcl Er Trandolapril-verapamil Hcl Er Valium Diazepam Psychotropic Info: Xanax, Lexapro         Current Medications (03/20/2020):  This is the current hospital active medication list Current Facility-Administered Medications  Medication Dose Route Frequency Provider Last Rate Last Admin  . acetaminophen (TYLENOL) tablet 650 mg  650 mg Oral Q6H Ileana Roup, MD   650 mg at 03/20/20 0546  . cholecalciferol (VITAMIN D) tablet 5,000 Units  5,000 Units Oral Daily Kayleen Memos, DO   5,000 Units at 03/20/20 0955  . [START ON 03/26/2020] cyanocobalamin ((VITAMIN B-12)) injection 1,000 mcg  1,000 mcg Intramuscular Q21 days Irene Pap N, DO      . diclofenac Sodium (VOLTAREN) 1 % topical gel 2 g  2 g Topical QID PRN Greer Pickerel, MD      . diphenhydrAMINE (BENADRYL) 12.5 MG/5ML elixir 12.5 mg  12.5 mg Oral Q6H PRN Greer Pickerel, MD   12.5 mg at 03/19/20 1352   Or  . diphenhydrAMINE (BENADRYL) injection 12.5 mg  12.5 mg Intravenous Q6H PRN Greer Pickerel, MD   12.5 mg at 03/19/20 0521  . enoxaparin (LOVENOX) injection 40 mg  40 mg Subcutaneous W38L Leighton Ruff, MD   40 mg at 03/20/20 1002  . escitalopram (LEXAPRO) tablet 10 mg  10 mg Oral Daily Irene Pap N, DO   10 mg at 03/20/20 0957  . feeding supplement (GLUCERNA SHAKE) (GLUCERNA SHAKE) liquid 237 mL  237 mL Oral TID BM Ileana Roup, MD   237 mL at 03/20/20 1012  .  ibuprofen (ADVIL) tablet 400 mg  400 mg Oral Q6H Ileana Roup, MD   400 mg at 03/20/20 0956  . insulin glargine (LANTUS) injection 10 Units  10 Units Subcutaneous BID Irene Pap N, DO   10 Units at 03/20/20 1002  . levothyroxine (SYNTHROID) tablet 75 mcg  75 mcg Oral QAC breakfast Greer Pickerel, MD   75 mcg at 03/20/20 0546  . metoCLOPramide (REGLAN) 10 MG/10ML solution 5 mg  5 mg Oral TID AC Green, Terri L, RPH   5 mg at 03/20/20 0957  . ondansetron (ZOFRAN-ODT) disintegrating tablet 4 mg  4 mg Oral Q6H PRN Greer Pickerel, MD       Or  . ondansetron [facility]) injection 4 mg  4 mg Intravenous Q6H PRN Greer Pickerel, MD   4 mg at 03/19/20 3734  . oxyCODONE (Oxy IR/ROXICODONE) immediate release tablet 5 mg  5 mg Oral Q6H PRN Ileana Roup, MD   5 mg at 03/19/20 1352  . phenytoin (DILANTIN) ER capsule 100 mg  100 mg Oral Daily Minda Ditto, RPH   100 mg at 03/20/20 2876   And  . phenytoin (DILANTIN) chewable tablet 50 mg  50 mg Oral Daily Green, Terri L, RPH   50 mg at 03/20/20 1000  . phenytoin (DILANTIN) ER capsule 200 mg  200 mg Oral QHS Minda Ditto, RPH   200 mg at 03/19/20 2132  . polyethylene glycol (MIRALAX / GLYCOLAX) packet 17 g  17 g Oral Daily PRN Irene Pap N, DO      .  primidone (MYSOLINE) tablet 150 mg  150 mg Oral Daily Minda Ditto, RPH   150 mg at 03/20/20 7615   And  . primidone (MYSOLINE) tablet 100 mg  100 mg Oral QHS Minda Ditto, RPH   100 mg at 03/19/20 2134  . promethazine (PHENERGAN) injection 6.25 mg  6.25 mg Intravenous Q6H PRN Greer Pickerel, MD   6.25 mg at 03/19/20 0522  . simethicone (MYLICON) chewable tablet 40 mg  40 mg Oral Q6H PRN Greer Pickerel, MD   40 mg at 03/15/20 1304  . thiamine tablet 250 mg  250 mg Oral QHS Ileana Roup, MD   250 mg at 03/19/20 2132  . trandolapril (MAVIK) tablet 2 mg  2 mg Oral QHS Minda Ditto, RPH   2 mg at 03/19/20 2133   And  . verapamil (CALAN-SR) CR tablet 240 mg  240 mg Oral QHS Minda Ditto, RPH    240 mg at 03/19/20 2133     Discharge Medications: Please see discharge summary for a list of discharge medications.  Relevant Imaging Results:  Relevant Lab Results:   Additional Information SSN: [id number]  Devonte Migues, Juliann Pulse, RN

## 2020-03-20 NOTE — NC FL2 (Signed)
[place] LEVEL OF CARE SCREENING TOOL     IDENTIFICATION  Patient Name: [name] Birthdate: Riveron 31, 1931 Sex: female Admission Date (Current Location): 03/12/2020  [facility] and [place] Number:  Herbalist and Address:  [facility],  [place] [address] [place]      Provider Number: [id number]  Attending Physician Name and Address:  Ileana Roup, MD  Relative Name and Phone Number:       Current Level of Care: Hospital Recommended Level of Care: [place] Prior Approval Number:    Date Approved/Denied:   PASRR Number: [id number]  Discharge Plan: SNF    Current Diagnoses: Patient Active Problem List   Diagnosis Date Noted  . Nausea & vomiting 03/12/2020  . Cancer of ascending colon pT3pN0 (0/12 LN) s/p lap right colectomy 03/06/2020 03/09/2020  . History of multiple allergies 03/09/2020  . S/P right hemicolectomy 03/06/2020  . Closed fracture of shaft of tibia 02/12/2020  . Gait abnormality 12/04/2019  . Impaired left ventricular function 09/05/2019  . Hypokalemia   . Effusion of left knee joint   . Hyponatremia   . Left patella fracture 10/26/2018  . Fracture of patella 10/26/2018  . Acquired hypothyroidism 08/25/2017  . 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 Hammer toe 08/25/2017  . History of breast cancer 08/25/2017  . Nontoxic thyroid nodule 08/25/2017  . Osteopetrosis 08/25/2017  . Dysuria 01/19/2017  . Vitamin D deficiency 11/18/2016  . Tremor, essential 08/18/2016  . Convulsions/seizures ([place]) 10/16/2014  . Brain tumor (benign) ([place]) 10/18/2013  . Subdural hemorrhage ([place]) 10/09/2013  . Anxiety 10/09/2013  . Compression fracture of T12 vertebra ([place]) 10/09/2013  . Nontoxic multinodular goiter 09/05/2013  . Syncope 08/27/2013  . Carotid artery disease ([place]) 08/27/2013  . Abnormal thyroid ultrasound 08/27/2013  . Cancer of central portion of female breast ([place])  12/30/2012  . Osteoporosis 03/06/2010  . ASTHMA 03/05/2009  . Asthma 03/05/2009  . IRRITABLE BOWEL SYNDROME  diarrhea type 03/21/2008  . ANEMIA-NOS 12/16/2006  . GERD 12/16/2006  . Insulin-requiring or dependent type II diabetes mellitus ([place]) 10/29/2006  . Mixed hyperlipidemia 10/29/2006  . Essential hypertension 10/29/2006  . Allergic rhinitis, cause unspecified 10/29/2006  . OSTEOARTHRITIS 10/29/2006    Orientation RESPIRATION BLADDER Height & Weight     Self, Time, Situation, Place  Normal Incontinent Weight: 70.8 kg Height:  5' 7.5" (171.5 cm)  BEHAVIORAL SYMPTOMS/MOOD NEUROLOGICAL BOWEL NUTRITION STATUS      Continent Diet (see dc summary)  AMBULATORY STATUS COMMUNICATION OF NEEDS Skin   Extensive Assist Verbally PU Stage and Appropriate Care (sacrum)                       Personal Care Assistance Level of Assistance  Bathing, Feeding, Dressing Bathing Assistance: Limited assistance Feeding assistance: Independent Dressing Assistance: Limited assistance     Functional Limitations Info  Sight, Hearing, Speech Sight Info: Adequate Hearing Info: Adequate Speech Info: Adequate    SPECIAL CARE FACTORS FREQUENCY  PT (By licensed PT), OT (By licensed OT)     PT Frequency: 5x week OT Frequency: 3x week            Contractures Contractures Info: Not present    Additional Factors Info  Code Status, Allergies, Psychotropic Code Status Info: Full Allergies Info: Bystolic Nebivolol HclCholestyramine Hydrazine Yellow TartrazineMorphine Niacin Niaspan Niacin ErNorvasc Amlodipine Besylate Optivar Azelastine Hcl Repaglinide Sular Nisoldipine ErTegretol Carbamazepine Telmisartan Amoxicillin-pot Clavulanate Cefdinir  Ciprofloxacin Hcl Clonidine Clonidine Hydrochloride Codeine Ezetimibe Hydroxychloroquine Naproxen Sulfa Antibiotics Ziac Bisoprolol-hydrochlorothiazide Azelastine Elavil AmitriptylineEmpagliflozin Hydralazine Iodine Kenalog TriamcinoloneKeppra Levetiracetam  Lamictal Lamotrigine Lyrica Pregabalin Pseudoephedrine Topamax TopiramateAce Inhibitors Actonel Risedronate SodiumAmlodipine Besylate Aspirin Atacand Candesartan Bextra Valdecoxib Bisoprolol-hydrochlorothiazide Candesartan Cilexetil Cefadroxil Celecoxib Hydrocodone Hydrocodone-acetaminophen Iodinated Diagnostic Agents Meloxicam Methylprednisolone Sodium Succinate Nabumetone Penicillins Pseudoephedrine-guaifenesin Risedronate Sodium Rofecoxib Ru-tuss Chlorphen-pse-atrop-hyos-scopSulfonamide Derivatives [place] Sulfur Telithromycin Terfenadine Trandolapril-verapamil Hcl Er Trandolapril-verapamil Hcl Er Valium Diazepam Psychotropic Info: Xanax, Lexapro         Current Medications (03/20/2020):  This is the current hospital active medication list Current Facility-Administered Medications  Medication Dose Route Frequency Provider Last Rate Last Admin  . acetaminophen (TYLENOL) tablet 650 mg  650 mg Oral Q6H Ileana Roup, MD   650 mg at 03/20/20 0546  . cholecalciferol (VITAMIN D) tablet 5,000 Units  5,000 Units Oral Daily Kayleen Memos, DO   5,000 Units at 03/20/20 0955  . [START ON 03/26/2020] cyanocobalamin ((VITAMIN B-12)) injection 1,000 mcg  1,000 mcg Intramuscular Q21 days Irene Pap N, DO      . diclofenac Sodium (VOLTAREN) 1 % topical gel 2 g  2 g Topical QID PRN Greer Pickerel, MD      . diphenhydrAMINE (BENADRYL) 12.5 MG/5ML elixir 12.5 mg  12.5 mg Oral Q6H PRN Greer Pickerel, MD   12.5 mg at 03/19/20 1352   Or  . diphenhydrAMINE (BENADRYL) injection 12.5 mg  12.5 mg Intravenous Q6H PRN Greer Pickerel, MD   12.5 mg at 03/19/20 0521  . enoxaparin (LOVENOX) injection 40 mg  40 mg Subcutaneous O13Y Leighton Ruff, MD   40 mg at 03/20/20 1002  . escitalopram (LEXAPRO) tablet 10 mg  10 mg Oral Daily Irene Pap N, DO   10 mg at 03/20/20 0957  . feeding supplement (GLUCERNA SHAKE) (GLUCERNA SHAKE) liquid 237 mL  237 mL Oral TID BM Ileana Roup, MD   237 mL at 03/20/20 1012  .  ibuprofen (ADVIL) tablet 400 mg  400 mg Oral Q6H Ileana Roup, MD   400 mg at 03/20/20 0956  . insulin glargine (LANTUS) injection 10 Units  10 Units Subcutaneous BID Irene Pap N, DO   10 Units at 03/20/20 1002  . levothyroxine (SYNTHROID) tablet 75 mcg  75 mcg Oral QAC breakfast Greer Pickerel, MD   75 mcg at 03/20/20 0546  . metoCLOPramide (REGLAN) 10 MG/10ML solution 5 mg  5 mg Oral TID AC Green, Terri L, RPH   5 mg at 03/20/20 0957  . ondansetron (ZOFRAN-ODT) disintegrating tablet 4 mg  4 mg Oral Q6H PRN Greer Pickerel, MD       Or  . ondansetron [facility]) injection 4 mg  4 mg Intravenous Q6H PRN Greer Pickerel, MD   4 mg at 03/19/20 8657  . oxyCODONE (Oxy IR/ROXICODONE) immediate release tablet 5 mg  5 mg Oral Q6H PRN Ileana Roup, MD   5 mg at 03/19/20 1352  . phenytoin (DILANTIN) ER capsule 100 mg  100 mg Oral Daily Minda Ditto, RPH   100 mg at 03/20/20 8469   And  . phenytoin (DILANTIN) chewable tablet 50 mg  50 mg Oral Daily Green, Terri L, RPH   50 mg at 03/20/20 1000  . phenytoin (DILANTIN) ER capsule 200 mg  200 mg Oral QHS Minda Ditto, RPH   200 mg at 03/19/20 2132  . polyethylene glycol (MIRALAX / GLYCOLAX) packet 17 g  17 g Oral Daily PRN Irene Pap N, DO      .  primidone (MYSOLINE) tablet 150 mg  150 mg Oral Daily Minda Ditto, RPH   150 mg at 03/20/20 0350   And  . primidone (MYSOLINE) tablet 100 mg  100 mg Oral QHS Minda Ditto, RPH   100 mg at 03/19/20 2134  . promethazine (PHENERGAN) injection 6.25 mg  6.25 mg Intravenous Q6H PRN Greer Pickerel, MD   6.25 mg at 03/19/20 0522  . simethicone (MYLICON) chewable tablet 40 mg  40 mg Oral Q6H PRN Greer Pickerel, MD   40 mg at 03/15/20 1304  . thiamine tablet 250 mg  250 mg Oral QHS Ileana Roup, MD   250 mg at 03/19/20 2132  . trandolapril (MAVIK) tablet 2 mg  2 mg Oral QHS Minda Ditto, RPH   2 mg at 03/19/20 2133   And  . verapamil (CALAN-SR) CR tablet 240 mg  240 mg Oral QHS Minda Ditto, RPH    240 mg at 03/19/20 2133     Discharge Medications: Please see discharge summary for a list of discharge medications.  Relevant Imaging Results:  Relevant Lab Results:   Additional Information SSN: [id number]  Leala Bryand, Juliann Pulse, RN

## 2020-03-20 NOTE — Progress Notes (Signed)
PROGRESS NOTE    [name]  ZCH:[id number] DOB: [birth year] DOA: 03/12/2020 PCP: Bartholome Bill, MD    Brief Narrative:  86 y.o.femalewith a past medical history of chronic hyponatremia, IDDM2, hypothyroidism, hypertension, hyperlipidemia, left tib/fib fracture, breast cancer, brain tumor s/p surgery complicated with seizures, colon cancer, recent hospitalization from11/3-11/9 for laparoscopic right hemicolectomy with extensive adhesiolysis by Dr. Dema Severin on 03/06/20 for colon cancer complicated by postop bleeding requiring blood transfusions.  Was discharged the day prior to this presentation but returns to the ED due to persistent generalized weakness, nausea and abdominal pain. CT imaging was performed which showed mild small bowel dilatation and fluid within the abdomen consistent with blood.  CXR was unremarkable and initial lactate was elevated but repeat was normal. Given her multiple medical comorbidities, TRH, Hospitalist team, was consulted.  Assessment & Plan:   Active Problems:   Nausea & vomiting  Colon cancer s/p right hemicolectomy on 03/06/2020 by general surgery, Dr. Dema Severin 1. Continue management per primary team  Hypomagnesemia Serum magnesium 1.1 on 03/18/2020 Normalized and currently 2.1 today  Improving, hypovolemic hyponatremia   Serum sodium 128>> 129 She was started on Salt tablets  Resolved post repletion: Hypokalemia Corrected  Type 2 diabetes, with hyperglycemia. Hemoglobin A1c 8.6 on 03/04/2020. Increase Lantus dose to 10 units twice daily Continue to monitor CBGs  Abdominal discomfort No evidence of a systemic infection or active infective process Her hemoglobin is stable and uptrending, 11 from 9.9 Patient needs to mobilize. Out of bed to chair with every shift Avoid opiate-induced constipation, add MiraLAX as needed Pending SNF per primary service  Acute blood loss anemia post surgery, stable Hemoglobin uptrending 11.0 from 9.9  from 10.3 from 10.4 from 10.7 No overt bleeding this AM  Resolved hyperkalemia Most recent K of 3.8  Seizure disorder Continue home AEDs Last dilantin level 9.4 on 08/08/19 Seizure precautions in place Will need to follow up with her neurologist outpatient Seizure free thus far  Essential hypertension Continue home oral antihypertensives Continue to monitor vital signs  Hypothyroidism TSH normal 2.729 Continue home Synthroid as tolerated  History of brain tumor s/p surgery  Ambulatory dysfunction PT OT rec SNF, TOC following OOB to chair every shift  DVT prophylaxis: Lovenox subq Code Status: Full Family Communication: Pt in room, family at bedside  Status is: Inpatient  Remains inpatient appropriate because:Unsafe d/c plan   Dispo:  Patient From: Home  Planned Disposition: [facility]  Expected discharge date: 03/20/20  Medically stable for discharge: Yes  Antimicrobials: Anti-infectives (From admission, onward)   Start     Dose/Rate Route Frequency Ordered Stop   03/20/20 1730  fluconazole (DIFLUCAN) tablet 150 mg        150 mg Oral  Once 03/20/20 1632     03/14/20 1630  fluconazole (DIFLUCAN) tablet 150 mg  Status:  Discontinued        150 mg Oral  Once 03/14/20 1541 03/19/20 0616   03/13/20 2200  meropenem (MERREM) 1 g in sodium chloride 0.9 % 100 mL IVPB  Status:  Discontinued        1 g 200 mL/hr over 30 Minutes Intravenous Every 12 hours 03/13/20 1015 03/15/20 0923   03/13/20 0915  meropenem (MERREM) 1 g in sodium chloride 0.9 % 100 mL IVPB  Status:  Discontinued        1 g 200 mL/hr over 30 Minutes Intravenous Every 8 hours 03/13/20 0854 03/13/20 1015   03/12/20 2200  meropenem (MERREM) 1  g in sodium chloride 0.9 % 100 mL IVPB        1 g 200 mL/hr over 30 Minutes Intravenous  Once 03/12/20 2150 03/12/20 2355   03/12/20 1945  metroNIDAZOLE (FLAGYL) IVPB 500 mg        500 mg 100 mL/hr over 60 Minutes Intravenous  Once 03/12/20 1936  03/12/20 2133   03/12/20 1945  vancomycin (VANCOCIN) IVPB 1000 mg/200 mL premix        1,000 mg 200 mL/hr over 60 Minutes Intravenous  Once 03/12/20 1936 03/12/20 2133       Subjective: Complains of abd discomfort this AM prior to rounds by surgical service  Objective: Vitals:   03/19/20 2118 03/20/20 0545 03/20/20 0600 03/20/20 1143  BP: 138/83 (!) 142/83  (!) 147/81  Pulse: 88 78  86  Resp: 20 16  20   Temp: 98.4 F (36.9 C) (!) 97.4 F (36.3 C)  (!) 97.4 F (36.3 C)  TempSrc: Oral Oral  Oral  SpO2: 93% 95%  96%  Weight:   70.8 kg   Height:        Intake/Output Summary (Last 24 hours) at 03/20/2020 1638 Last data filed at 03/20/2020 1605 Gross per 24 hour  Intake 1100 ml  Output 1300 ml  Net -200 ml   Filed Weights   03/12/20 2245 03/20/20 0600  Weight: 65 kg 70.8 kg    Examination:  General exam: Appears calm and comfortable  Respiratory system: Clear to auscultation. Respiratory effort normal. Cardiovascular system: S1 & S2 heard, Regular Gastrointestinal system: Abdomen is nondistended, soft and nontender. No organomegaly or masses felt. Normal bowel sounds heard. Central nervous system: Alert and oriented. No focal neurological deficits. Extremities: Symmetric 5 x 5 power. Skin: No rashes, lesions  Psychiatry: Judgement and insight appear normal. Mood & affect appropriate.   Data Reviewed: I have personally reviewed following labs and imaging studies  CBC: Recent Labs  Lab 03/14/20 0717 03/15/20 0513 03/16/20 1105 03/18/20 0557 03/19/20 1012  WBC 12.1* 7.0 9.7 9.5 11.8*  NEUTROABS 9.0* 4.8 7.8*  --   --   HGB 10.7* 10.4* 10.3* 9.9* 11.0*  HCT 31.7* 32.1* 32.2* 30.1* 32.9*  MCV 84.5 85.4 86.3 85.0 82.9  PLT 331 322 389 354 789   Basic Metabolic Panel: Recent Labs  Lab 03/16/20 1105 03/17/20 0604 03/17/20 1220 03/17/20 1520 03/18/20 0557 03/18/20 1614 03/18/20 1619 03/19/20 1012 03/19/20 1342  NA 126*   < > 126* 127* 128* 128*  --   129*  --   K 4.4  --  3.7  --  3.2* 3.1*  --  3.8  --   CL 100  --  103  --  104 102  --  103  --   CO2 11*  --  11*  --  18* 18*  --  19*  --   GLUCOSE 144*  --  168*  --  153* 255*  --  256*  --   BUN 8  --  5*  --  <5* <5*  --  <5*  --   CREATININE 0.80  --  0.80  --  0.54 0.63  --  0.56  --   CALCIUM 7.4*  --  7.6*  --  7.4* 7.2*  --  7.4*  --   MG  --   --   --   --   --   --  1.1* 1.1* 2.1   < > = values in this interval not  displayed.   GFR: Estimated Creatinine Clearance: 48.2 mL/min (by C-G formula based on SCr of 0.56 mg/dL). Liver Function Tests: Recent Labs  Lab 03/16/20 1105 03/17/20 1220  AST 19 12*  ALT 15 11  ALKPHOS 101 91  BILITOT 1.4* 1.4*  PROT 5.1* 5.0*  ALBUMIN 2.4* 2.3*   No results for input(s): LIPASE, AMYLASE in the last 168 hours. No results for input(s): AMMONIA in the last 168 hours. Coagulation Profile: No results for input(s): INR, PROTIME in the last 168 hours. Cardiac Enzymes: No results for input(s): CKTOTAL, CKMB, CKMBINDEX, TROPONINI in the last 168 hours. BNP (last 3 results) No results for input(s): PROBNP in the last 8760 hours. HbA1C: No results for input(s): HGBA1C in the last 72 hours. CBG: Recent Labs  Lab 03/19/20 1235 03/19/20 1708 03/19/20 2117 03/20/20 0731 03/20/20 1140  GLUCAP 199* 181* 147* 81 150*   Lipid Profile: No results for input(s): CHOL, HDL, LDLCALC, TRIG, CHOLHDL, LDLDIRECT in the last 72 hours. Thyroid Function Tests: No results for input(s): TSH, T4TOTAL, FREET4, T3FREE, THYROIDAB in the last 72 hours. Anemia Panel: No results for input(s): VITAMINB12, FOLATE, FERRITIN, TIBC, IRON, RETICCTPCT in the last 72 hours. Sepsis Labs: Recent Labs  Lab 03/16/20 1105  PROCALCITON <0.10  LATICACIDVEN 0.7    Recent Results (from the past 240 hour(s))  Blood Culture (routine x 2)     Status: None   Collection Time: 03/12/20  8:00 PM   Specimen: BLOOD  Result Value Ref Range Status   Specimen Description    Final    BLOOD RIGHT ANTECUBITAL Performed at [facility] [address] [place], [place] [address]    Special Requests   Final    BOTTLES DRAWN AEROBIC AND ANAEROBIC Blood Culture adequate volume Performed at [facility] [address] [place], [place] [address]    Culture   Final    NO GROWTH 5 DAYS Performed at [facility], [place] [address] [place], [place] [address]    Report Status 03/17/2020 FINAL  Final  Blood Culture (routine x 2)     Status: None   Collection Time: 03/12/20  8:10 PM   Specimen: BLOOD RIGHT FOREARM  Result Value Ref Range Status   Specimen Description   Final    BLOOD RIGHT FOREARM Performed at [facility] [address], [place], [place] [address]    Special Requests   Final    BOTTLES DRAWN AEROBIC AND ANAEROBIC Blood Culture adequate volume Performed at [facility] [address] [place], [place] [address]    Culture   Final    NO GROWTH 5 DAYS Performed at [facility], [place] [address] [place], [place] [address]    Report Status 03/17/2020 FINAL  Final  Respiratory Panel by RT PCR (Flu A&B, Covid) - Nasopharyngeal Swab     Status: None   Collection Time: 03/12/20  8:42 PM   Specimen: Nasopharyngeal Swab  Result Value Ref Range Status   SARS Coronavirus 2 by RT PCR NEGATIVE NEGATIVE Final    Comment: (NOTE) SARS-CoV-2 target nucleic acids are NOT DETECTED.  The SARS-CoV-2 RNA is generally detectable in upper respiratoy specimens during the acute phase of infection. The lowest concentration of SARS-CoV-2 viral copies this assay can detect is 131 copies/mL. A negative result does not preclude SARS-Cov-2 infection and should not be used as the sole basis for treatment or other patient management decisions. A negative result [place]  occur with  improper specimen collection/handling, submission of specimen other than nasopharyngeal swab, presence of viral  mutation(s) within the areas targeted by this assay, and inadequate number of viral copies (<131 copies/mL). A negative result must be combined with clinical observations, patient history, and epidemiological information. The expected result is Negative.  Fact Sheet for Patients:  PinkCheek.be  Fact Sheet for Healthcare Providers:  GravelBags.it  This test is no t yet approved or cleared by the [place] FDA and  has been authorized for detection and/or diagnosis of SARS-CoV-2 by FDA under an Emergency Use Authorization (EUA). This EUA will remain  in effect (meaning this test can be used) for the duration of the COVID-19 declaration under Section 564(b)(1) of the Act, 21 U.S.C. section 360bbb-3(b)(1), unless the authorization is terminated or revoked sooner.     Influenza A by PCR NEGATIVE NEGATIVE Final   Influenza B by PCR NEGATIVE NEGATIVE Final    Comment: (NOTE) The Xpert Xpress SARS-CoV-2/FLU/RSV assay is intended as an aid in  the diagnosis of influenza from Nasopharyngeal swab specimens and  should not be used as a sole basis for treatment. Nasal washings and  aspirates are unacceptable for Xpert Xpress SARS-CoV-2/FLU/RSV  testing.  Fact Sheet for Patients: PinkCheek.be  Fact Sheet for Healthcare Providers: GravelBags.it  This test is not yet approved or cleared by the [place] FDA and  has been authorized for detection and/or diagnosis of SARS-CoV-2 by  FDA under an Emergency Use Authorization (EUA). This EUA will remain  in effect (meaning this test can be used) for the duration of the  Covid-19 declaration under Section 564(b)(1) of the Act, 21  U.S.C. section 360bbb-3(b)(1), unless the authorization is  terminated or revoked. Performed at [facility], [place] [address], [place], [place] [address]   Urine culture      Status: None   Collection Time: 03/13/20 11:12 AM   Specimen: In/Out Cath Urine  Result Value Ref Range Status   Specimen Description   Final    IN/OUT CATH URINE Performed at [facility] [address] [place], [place] [address]    Special Requests   Final    NONE Performed at [facility], [place] [address], [place], [place] [address]    Culture   Final    NO GROWTH Performed at [facility], [place] [address] [place], [place] [address]    Report Status 03/14/2020 FINAL  Final  SARS Coronavirus 2 by RT PCR (hospital order, performed in [facility] hospital lab) Nasopharyngeal Nasopharyngeal Swab     Status: None   Collection Time: 03/20/20  1:19 PM   Specimen: Nasopharyngeal Swab  Result Value Ref Range Status   SARS Coronavirus 2 NEGATIVE NEGATIVE Final    Comment: (NOTE) SARS-CoV-2 target nucleic acids are NOT DETECTED.  The SARS-CoV-2 RNA is generally detectable in upper and lower respiratory specimens during the acute phase of infection. The lowest concentration of SARS-CoV-2 viral copies this assay can detect is 250 copies / mL. A negative result does not preclude SARS-CoV-2 infection and should not be used as the sole basis for treatment or other patient management decisions.  A negative result [place] occur with improper specimen collection / handling, submission of specimen other than nasopharyngeal swab, presence of viral mutation(s) within the areas targeted by this assay, and inadequate number of viral copies (<250 copies / mL). A negative result must be combined with clinical observations, patient history, and epidemiological information.  Fact Sheet for Patients:   StrictlyIdeas.no  Fact Sheet for Healthcare Providers: BankingDealers.co.za  This test is not yet approved or  cleared by the [place] FDA and has been authorized for detection and/or diagnosis of SARS-CoV-2  by FDA under an Emergency Use Authorization (EUA).  This EUA will remain in effect (meaning this test can be used) for the duration of the COVID-19 declaration under Section 564(b)(1) of the Act, 21 U.S.C. section 360bbb-3(b)(1), unless the authorization is terminated or revoked sooner.  Performed at [facility], [place] [address] [place] [address], [place], [place] [address]      Radiology Studies: No results found.  Scheduled Meds: . acetaminophen  650 mg Oral Q6H  . cholecalciferol  5,000 Units Oral Daily  . [START ON 03/26/2020] cyanocobalamin  1,000 mcg Intramuscular Q21 days  . enoxaparin (LOVENOX) injection  40 mg Subcutaneous Q24H  . escitalopram  10 mg Oral Daily  . feeding supplement (GLUCERNA SHAKE)  237 mL Oral TID BM  . fluconazole  150 mg Oral Once  . ibuprofen  400 mg Oral Q6H  . insulin glargine  10 Units Subcutaneous BID  . levothyroxine  75 mcg Oral QAC breakfast  . loperamide  2 mg Oral BID  . phenytoin  100 mg Oral Daily   And  . phenytoin  50 mg Oral Daily  . phenytoin  200 mg Oral QHS  . primidone  150 mg Oral Daily   And  . primidone  100 mg Oral QHS  . vitamin B-1  250 mg Oral QHS  . trandolapril  2 mg Oral QHS   And  . verapamil  240 mg Oral QHS   Continuous Infusions:   LOS: 8 days   Marylu Lund, MD [facility] Pager On Amion  If 7PM-7AM, please contact night-coverage 03/20/2020, 4:38 PM

## 2020-03-21 DIAGNOSIS — C189 Malignant neoplasm of colon, unspecified: Secondary | ICD-10-CM | POA: Diagnosis not present

## 2020-03-21 DIAGNOSIS — R5381 Other malaise: Secondary | ICD-10-CM | POA: Diagnosis not present

## 2020-03-21 DIAGNOSIS — I2699 Other pulmonary embolism without acute cor pulmonale: Secondary | ICD-10-CM | POA: Diagnosis not present

## 2020-03-21 DIAGNOSIS — M6281 Muscle weakness (generalized): Secondary | ICD-10-CM | POA: Diagnosis not present

## 2020-03-21 DIAGNOSIS — R0789 Other chest pain: Secondary | ICD-10-CM | POA: Diagnosis not present

## 2020-03-21 DIAGNOSIS — E871 Hypo-osmolality and hyponatremia: Secondary | ICD-10-CM | POA: Diagnosis not present

## 2020-03-21 DIAGNOSIS — C182 Malignant neoplasm of ascending colon: Secondary | ICD-10-CM | POA: Diagnosis not present

## 2020-03-21 DIAGNOSIS — S0990XA Unspecified injury of head, initial encounter: Secondary | ICD-10-CM | POA: Diagnosis not present

## 2020-03-21 DIAGNOSIS — S199XXA Unspecified injury of neck, initial encounter: Secondary | ICD-10-CM | POA: Diagnosis not present

## 2020-03-21 DIAGNOSIS — Z794 Long term (current) use of insulin: Secondary | ICD-10-CM | POA: Diagnosis not present

## 2020-03-21 DIAGNOSIS — F332 Major depressive disorder, recurrent severe without psychotic features: Secondary | ICD-10-CM | POA: Diagnosis not present

## 2020-03-21 DIAGNOSIS — Z23 Encounter for immunization: Secondary | ICD-10-CM | POA: Diagnosis not present

## 2020-03-21 DIAGNOSIS — Z7401 Bed confinement status: Secondary | ICD-10-CM | POA: Diagnosis not present

## 2020-03-21 DIAGNOSIS — I700 Atherosclerosis of aorta: Secondary | ICD-10-CM | POA: Diagnosis not present

## 2020-03-21 DIAGNOSIS — R1084 Generalized abdominal pain: Secondary | ICD-10-CM | POA: Diagnosis not present

## 2020-03-21 DIAGNOSIS — W1830XD Fall on same level, unspecified, subsequent encounter: Secondary | ICD-10-CM | POA: Diagnosis not present

## 2020-03-21 DIAGNOSIS — Y838 Other surgical procedures as the cause of abnormal reaction of the patient, or of later complication, without mention of misadventure at the time of the procedure: Secondary | ICD-10-CM | POA: Diagnosis not present

## 2020-03-21 DIAGNOSIS — W19XXXA Unspecified fall, initial encounter: Secondary | ICD-10-CM | POA: Diagnosis not present

## 2020-03-21 DIAGNOSIS — G9389 Other specified disorders of brain: Secondary | ICD-10-CM | POA: Diagnosis not present

## 2020-03-21 DIAGNOSIS — E039 Hypothyroidism, unspecified: Secondary | ICD-10-CM | POA: Diagnosis not present

## 2020-03-21 DIAGNOSIS — M2550 Pain in unspecified joint: Secondary | ICD-10-CM | POA: Diagnosis not present

## 2020-03-21 DIAGNOSIS — R279 Unspecified lack of coordination: Secondary | ICD-10-CM | POA: Diagnosis not present

## 2020-03-21 DIAGNOSIS — F29 Unspecified psychosis not due to a substance or known physiological condition: Secondary | ICD-10-CM | POA: Diagnosis not present

## 2020-03-21 DIAGNOSIS — K2289 Other specified disease of esophagus: Secondary | ICD-10-CM | POA: Diagnosis not present

## 2020-03-21 DIAGNOSIS — Z79899 Other long term (current) drug therapy: Secondary | ICD-10-CM | POA: Diagnosis not present

## 2020-03-21 DIAGNOSIS — M79662 Pain in left lower leg: Secondary | ICD-10-CM | POA: Diagnosis not present

## 2020-03-21 DIAGNOSIS — R2689 Other abnormalities of gait and mobility: Secondary | ICD-10-CM | POA: Diagnosis not present

## 2020-03-21 DIAGNOSIS — F411 Generalized anxiety disorder: Secondary | ICD-10-CM | POA: Diagnosis not present

## 2020-03-21 DIAGNOSIS — K661 Hemoperitoneum: Secondary | ICD-10-CM | POA: Diagnosis not present

## 2020-03-21 DIAGNOSIS — E041 Nontoxic single thyroid nodule: Secondary | ICD-10-CM | POA: Diagnosis not present

## 2020-03-21 DIAGNOSIS — J948 Other specified pleural conditions: Secondary | ICD-10-CM | POA: Diagnosis not present

## 2020-03-21 DIAGNOSIS — R9389 Abnormal findings on diagnostic imaging of other specified body structures: Secondary | ICD-10-CM | POA: Diagnosis not present

## 2020-03-21 DIAGNOSIS — R079 Chest pain, unspecified: Secondary | ICD-10-CM | POA: Diagnosis not present

## 2020-03-21 DIAGNOSIS — J90 Pleural effusion, not elsewhere classified: Secondary | ICD-10-CM | POA: Diagnosis not present

## 2020-03-21 DIAGNOSIS — R52 Pain, unspecified: Secondary | ICD-10-CM | POA: Diagnosis not present

## 2020-03-21 DIAGNOSIS — G40909 Epilepsy, unspecified, not intractable, without status epilepticus: Secondary | ICD-10-CM | POA: Diagnosis not present

## 2020-03-21 DIAGNOSIS — R918 Other nonspecific abnormal finding of lung field: Secondary | ICD-10-CM | POA: Diagnosis not present

## 2020-03-21 DIAGNOSIS — R600 Localized edema: Secondary | ICD-10-CM | POA: Diagnosis not present

## 2020-03-21 DIAGNOSIS — F410 Panic disorder [episodic paroxysmal anxiety] without agoraphobia: Secondary | ICD-10-CM | POA: Diagnosis not present

## 2020-03-21 DIAGNOSIS — R2242 Localized swelling, mass and lump, left lower limb: Secondary | ICD-10-CM | POA: Diagnosis not present

## 2020-03-21 DIAGNOSIS — J189 Pneumonia, unspecified organism: Secondary | ICD-10-CM | POA: Diagnosis not present

## 2020-03-21 DIAGNOSIS — Z9889 Other specified postprocedural states: Secondary | ICD-10-CM | POA: Diagnosis not present

## 2020-03-21 DIAGNOSIS — E119 Type 2 diabetes mellitus without complications: Secondary | ICD-10-CM | POA: Diagnosis not present

## 2020-03-21 DIAGNOSIS — J45909 Unspecified asthma, uncomplicated: Secondary | ICD-10-CM | POA: Diagnosis not present

## 2020-03-21 DIAGNOSIS — I10 Essential (primary) hypertension: Secondary | ICD-10-CM | POA: Diagnosis not present

## 2020-03-21 DIAGNOSIS — W010XXA Fall on same level from slipping, tripping and stumbling without subsequent striking against object, initial encounter: Secondary | ICD-10-CM | POA: Diagnosis not present

## 2020-03-21 DIAGNOSIS — E559 Vitamin D deficiency, unspecified: Secondary | ICD-10-CM | POA: Diagnosis not present

## 2020-03-21 DIAGNOSIS — R531 Weakness: Secondary | ICD-10-CM | POA: Diagnosis not present

## 2020-03-21 DIAGNOSIS — Z85038 Personal history of other malignant neoplasm of large intestine: Secondary | ICD-10-CM | POA: Diagnosis not present

## 2020-03-21 DIAGNOSIS — R569 Unspecified convulsions: Secondary | ICD-10-CM | POA: Diagnosis not present

## 2020-03-21 DIAGNOSIS — Z7901 Long term (current) use of anticoagulants: Secondary | ICD-10-CM | POA: Diagnosis not present

## 2020-03-21 DIAGNOSIS — E1165 Type 2 diabetes mellitus with hyperglycemia: Secondary | ICD-10-CM | POA: Diagnosis not present

## 2020-03-21 DIAGNOSIS — D649 Anemia, unspecified: Secondary | ICD-10-CM | POA: Diagnosis not present

## 2020-03-21 DIAGNOSIS — I82412 Acute embolism and thrombosis of left femoral vein: Secondary | ICD-10-CM | POA: Diagnosis not present

## 2020-03-21 DIAGNOSIS — F419 Anxiety disorder, unspecified: Secondary | ICD-10-CM | POA: Diagnosis not present

## 2020-03-21 DIAGNOSIS — Z9049 Acquired absence of other specified parts of digestive tract: Secondary | ICD-10-CM | POA: Diagnosis not present

## 2020-03-21 DIAGNOSIS — I82402 Acute embolism and thrombosis of unspecified deep veins of left lower extremity: Secondary | ICD-10-CM | POA: Diagnosis not present

## 2020-03-21 DIAGNOSIS — G9341 Metabolic encephalopathy: Secondary | ICD-10-CM | POA: Diagnosis not present

## 2020-03-21 DIAGNOSIS — S82209D Unspecified fracture of shaft of unspecified tibia, subsequent encounter for closed fracture with routine healing: Secondary | ICD-10-CM | POA: Diagnosis not present

## 2020-03-21 DIAGNOSIS — Z743 Need for continuous supervision: Secondary | ICD-10-CM | POA: Diagnosis not present

## 2020-03-21 DIAGNOSIS — R0902 Hypoxemia: Secondary | ICD-10-CM | POA: Diagnosis not present

## 2020-03-21 DIAGNOSIS — J9811 Atelectasis: Secondary | ICD-10-CM | POA: Diagnosis not present

## 2020-03-21 DIAGNOSIS — M47812 Spondylosis without myelopathy or radiculopathy, cervical region: Secondary | ICD-10-CM | POA: Diagnosis not present

## 2020-03-21 DIAGNOSIS — I2510 Atherosclerotic heart disease of native coronary artery without angina pectoris: Secondary | ICD-10-CM | POA: Diagnosis not present

## 2020-03-21 DIAGNOSIS — I82432 Acute embolism and thrombosis of left popliteal vein: Secondary | ICD-10-CM | POA: Diagnosis not present

## 2020-03-21 DIAGNOSIS — R0602 Shortness of breath: Secondary | ICD-10-CM | POA: Diagnosis not present

## 2020-03-21 DIAGNOSIS — S0083XA Contusion of other part of head, initial encounter: Secondary | ICD-10-CM | POA: Diagnosis not present

## 2020-03-21 DIAGNOSIS — S0003XA Contusion of scalp, initial encounter: Secondary | ICD-10-CM | POA: Diagnosis not present

## 2020-03-21 DIAGNOSIS — I119 Hypertensive heart disease without heart failure: Secondary | ICD-10-CM | POA: Diagnosis not present

## 2020-03-21 DIAGNOSIS — Z20822 Contact with and (suspected) exposure to covid-19: Secondary | ICD-10-CM | POA: Diagnosis not present

## 2020-03-21 DIAGNOSIS — Z9011 Acquired absence of right breast and nipple: Secondary | ICD-10-CM | POA: Diagnosis not present

## 2020-03-21 DIAGNOSIS — D500 Iron deficiency anemia secondary to blood loss (chronic): Secondary | ICD-10-CM | POA: Diagnosis not present

## 2020-03-21 DIAGNOSIS — Z853 Personal history of malignant neoplasm of breast: Secondary | ICD-10-CM | POA: Diagnosis not present

## 2020-03-21 DIAGNOSIS — J9601 Acute respiratory failure with hypoxia: Secondary | ICD-10-CM | POA: Diagnosis not present

## 2020-03-21 DIAGNOSIS — D62 Acute posthemorrhagic anemia: Secondary | ICD-10-CM | POA: Diagnosis not present

## 2020-03-21 LAB — GLUCOSE, CAPILLARY
Glucose-Capillary: 91 mg/dL (ref 70–99)
Glucose-Capillary: 145 mg/dL — ABNORMAL HIGH (ref 70–99)

## 2020-03-21 NOTE — Progress Notes (Signed)
Subjective In great spirits. Denies nausea/vomiting. Passing flatus and having good bowel function - 3 BMs in last 24 hrs. Eating breakfast  Objective: Vital signs in last 24 hours: Temp:  [97.4 F (36.3 C)-98.1 F (36.7 C)] 97.8 F (36.6 C) (11/18 0516) Pulse Rate:  [84-86] 84 (11/18 0516) Resp:  [18-20] 18 (11/18 0516) BP: (146-152)/(75-89) 146/75 (11/18 0516) SpO2:  [93 %-97 %] 93 % (11/18 0516) Last BM Date: 03/20/20  Intake/Output from previous day: 11/17 0701 - 11/18 0700 In: 840 [P.O.:840] Out: 2050 [Urine:2050] Intake/Output this shift: Total I/O In: 240 [P.O.:240] Out: -   Gen: NAD, comfortable, mentation appropriate - A&O x3 CV: RRR Pulm: Normal work of breathing Abd: Soft, not significantly tender throughout; incisions c/d/i without erythema or drainage. Ecchymosis LLQ port site Ext: SCDs in place  Lab Results: CBC  Recent Labs    03/19/20 1012  WBC 11.8*  HGB 11.0*  HCT 32.9*  PLT 354   BMET Recent Labs    03/18/20 1614 03/19/20 1012  NA 128* 129*  K 3.1* 3.8  CL 102 103  CO2 18* 19*  GLUCOSE 255* 256*  BUN <5* <5*  CREATININE 0.63 0.56  CALCIUM 7.2* 7.4*   PT/INR No results for input(s): LABPROT, INR in the last 72 hours. ABG No results for input(s): PHART, HCO3 in the last 72 hours.  Invalid input(s): PCO2, PO2  Studies/Results:  Anti-infectives: Anti-infectives (From admission, onward)   Start     Dose/Rate Route Frequency Ordered Stop   03/20/20 1730  fluconazole (DIFLUCAN) tablet 150 mg        150 mg Oral  Once 03/20/20 1632 03/20/20 1828   03/14/20 1630  fluconazole (DIFLUCAN) tablet 150 mg  Status:  Discontinued        150 mg Oral  Once 03/14/20 1541 03/19/20 0616   03/13/20 2200  meropenem (MERREM) 1 g in sodium chloride 0.9 % 100 mL IVPB  Status:  Discontinued        1 g 200 mL/hr over 30 Minutes Intravenous Every 12 hours 03/13/20 1015 03/15/20 0923   03/13/20 0915  meropenem (MERREM) 1 g in sodium chloride 0.9 % 100 mL  IVPB  Status:  Discontinued        1 g 200 mL/hr over 30 Minutes Intravenous Every 8 hours 03/13/20 0854 03/13/20 1015   03/12/20 2200  meropenem (MERREM) 1 g in sodium chloride 0.9 % 100 mL IVPB        1 g 200 mL/hr over 30 Minutes Intravenous  Once 03/12/20 2150 03/12/20 2355   03/12/20 1945  metroNIDAZOLE (FLAGYL) IVPB 500 mg        500 mg 100 mL/hr over 60 Minutes Intravenous  Once 03/12/20 1936 03/12/20 2133   03/12/20 1945  vancomycin (VANCOCIN) IVPB 1000 mg/200 mL premix        1,000 mg 200 mL/hr over 60 Minutes Intravenous  Once 03/12/20 1936 03/12/20 2133       Assessment/Plan: Patient Active Problem List   Diagnosis Date Noted  . Nausea & vomiting 03/12/2020  . Cancer of ascending colon pT3pN0 (0/12 LN) s/p lap right colectomy 03/06/2020 03/09/2020  . History of multiple allergies 03/09/2020  . S/P right hemicolectomy 03/06/2020  . Closed fracture of shaft of tibia 02/12/2020  . Gait abnormality 12/04/2019  . Impaired left ventricular function 09/05/2019  . Hypokalemia   . Effusion of left knee joint   . Hyponatremia   . Left patella fracture 10/26/2018  . Fracture of  patella 10/26/2018  . Acquired hypothyroidism 08/25/2017  . 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 Hammer toe 08/25/2017  . History of breast cancer 08/25/2017  . Nontoxic thyroid nodule 08/25/2017  . Osteopetrosis 08/25/2017  . Dysuria 01/19/2017  . Vitamin D deficiency 11/18/2016  . Tremor, essential 08/18/2016  . Convulsions/seizures ([place]) 10/16/2014  . Brain tumor (benign) ([place]) 10/18/2013  . Subdural hemorrhage ([place]) 10/09/2013  . Anxiety 10/09/2013  . Compression fracture of T12 vertebra ([place]) 10/09/2013  . Nontoxic multinodular goiter 09/05/2013  . Syncope 08/27/2013  . Carotid artery disease ([place]) 08/27/2013  . Abnormal thyroid ultrasound 08/27/2013  . Cancer of central portion of female breast ([place]) 12/30/2012  . Osteoporosis 03/06/2010  . ASTHMA 03/05/2009  . Asthma  03/05/2009  . IRRITABLE BOWEL SYNDROME  diarrhea type 03/21/2008  . ANEMIA-NOS 12/16/2006  . GERD 12/16/2006  . Insulin-requiring or dependent type II diabetes mellitus ([place]) 10/29/2006  . Mixed hyperlipidemia 10/29/2006  . Essential hypertension 10/29/2006  . Allergic rhinitis, cause unspecified 10/29/2006  . OSTEOARTHRITIS 10/29/2006   -Soft diet + supplements - prefers chocolate flavor glucerna -Eating well - smaller more frequent meals; no emesis. Tolerating glucerna as well -PPx: SCDs, SQH  -Dispo: to SNF today  Nadeen Landau, MD [facility] Use [email or web address] to contact on call provider

## 2020-03-21 NOTE — Care Management Important Message (Signed)
Important Message  Patient Details IM Letter given to the Patient. Name: [name] MRN: [id number] Date of Birth: 01-Nov-1929   Medicare Important Message Given:  Yes     Kerin Salen 03/21/2020, 11:25 AM

## 2020-03-21 NOTE — TOC Transition Note (Addendum)
Transition of Care [facility]) - CM/SW Discharge Note   Patient Details  Name: [name] MRN: [id number] Date of Birth: 02/15/1930  [facility] CM/SW Contact:  Dessa Phi, RN Phone Number: 03/21/2020, 8:55 AM   Clinical Narrative: Informed Dr of needed updated d/c summary or progress note to send to [facility] will provide rm#,nsg report tel# once received d/c summary.[facility] for transport. 10:51a-[facility] going to rm#[id number],nsg tel# report [phone]. [facility] called.     Final next level of care: Skilled Nursing Facility Barriers to Discharge: No Barriers Identified   Patient Goals and CMS Choice Patient states their goals for this hospitalization and ongoing recovery are:: go to rehab CMS Medicare.gov Compare Post Acute Care list provided to:: Patient Represenative (must comment) Choice offered to / list presented to : Patient, Sibling  Discharge Placement PASRR number recieved: 03/19/20            Patient [facility] bed at: [facility] Patient to be transferred to facility by: [facility] Name of family member notified: Tonia Ghent sister Patient and family notified of of transfer: 03/21/20  Discharge Plan and Services In-house Referral: Clinical Social Work Discharge Planning Services: CM Consult Post Acute Care Choice: [facility]                               Social Determinants of Health (SDOH) Interventions     Readmission Risk Interventions Readmission Risk Prevention Plan 03/15/2020 03/13/2020  Transportation Screening Complete -  PCP or Specialist Appt within 3-5 Days Complete Complete  HRI or [place] - Complete  Social Work Consult for [place] Planning/Counseling Complete Complete  Palliative Care Screening Complete Complete  Medication Review Press photographer) Complete Complete  Some recent data might be hidden

## 2020-03-22 DIAGNOSIS — F411 Generalized anxiety disorder: Secondary | ICD-10-CM | POA: Diagnosis not present

## 2020-03-22 DIAGNOSIS — Z9049 Acquired absence of other specified parts of digestive tract: Secondary | ICD-10-CM | POA: Diagnosis not present

## 2020-03-22 DIAGNOSIS — C182 Malignant neoplasm of ascending colon: Secondary | ICD-10-CM | POA: Diagnosis not present

## 2020-03-22 DIAGNOSIS — M6281 Muscle weakness (generalized): Secondary | ICD-10-CM | POA: Diagnosis not present

## 2020-03-22 DIAGNOSIS — R1084 Generalized abdominal pain: Secondary | ICD-10-CM | POA: Diagnosis not present

## 2020-03-27 DIAGNOSIS — F411 Generalized anxiety disorder: Secondary | ICD-10-CM | POA: Diagnosis not present

## 2020-03-27 DIAGNOSIS — R600 Localized edema: Secondary | ICD-10-CM | POA: Diagnosis not present

## 2020-03-27 DIAGNOSIS — C182 Malignant neoplasm of ascending colon: Secondary | ICD-10-CM | POA: Diagnosis not present

## 2020-03-27 DIAGNOSIS — F332 Major depressive disorder, recurrent severe without psychotic features: Secondary | ICD-10-CM | POA: Diagnosis not present

## 2020-03-27 DIAGNOSIS — R1084 Generalized abdominal pain: Secondary | ICD-10-CM | POA: Diagnosis not present

## 2020-03-27 DIAGNOSIS — M79662 Pain in left lower leg: Secondary | ICD-10-CM | POA: Diagnosis not present

## 2020-03-27 DIAGNOSIS — M6281 Muscle weakness (generalized): Secondary | ICD-10-CM | POA: Diagnosis not present

## 2020-03-31 ENCOUNTER — Observation Stay (HOSPITAL_COMMUNITY)
Admission: EM | Admit: 2020-03-31 | Discharge: 2020-04-02 | Disposition: A | Discharge status: Home / Self Care | Payer: Medicare Other | Attending: Family Medicine | Admitting: Family Medicine

## 2020-03-31 ENCOUNTER — Emergency Department (HOSPITAL_COMMUNITY): Payer: Medicare Other

## 2020-03-31 DIAGNOSIS — I2510 Atherosclerotic heart disease of native coronary artery without angina pectoris: Secondary | ICD-10-CM | POA: Insufficient documentation

## 2020-03-31 DIAGNOSIS — C189 Malignant neoplasm of colon, unspecified: Secondary | ICD-10-CM | POA: Insufficient documentation

## 2020-03-31 DIAGNOSIS — J9811 Atelectasis: Secondary | ICD-10-CM | POA: Diagnosis not present

## 2020-03-31 DIAGNOSIS — Z79899 Other long term (current) drug therapy: Secondary | ICD-10-CM | POA: Insufficient documentation

## 2020-03-31 DIAGNOSIS — J45909 Unspecified asthma, uncomplicated: Secondary | ICD-10-CM | POA: Insufficient documentation

## 2020-03-31 DIAGNOSIS — R9389 Abnormal findings on diagnostic imaging of other specified body structures: Secondary | ICD-10-CM | POA: Diagnosis present

## 2020-03-31 DIAGNOSIS — Z882 Allergy status to sulfonamides status: Secondary | ICD-10-CM | POA: Insufficient documentation

## 2020-03-31 DIAGNOSIS — I10 Essential (primary) hypertension: Secondary | ICD-10-CM | POA: Insufficient documentation

## 2020-03-31 DIAGNOSIS — Z9049 Acquired absence of other specified parts of digestive tract: Secondary | ICD-10-CM | POA: Insufficient documentation

## 2020-03-31 DIAGNOSIS — E039 Hypothyroidism, unspecified: Secondary | ICD-10-CM | POA: Insufficient documentation

## 2020-03-31 DIAGNOSIS — D649 Anemia, unspecified: Secondary | ICD-10-CM

## 2020-03-31 DIAGNOSIS — G9341 Metabolic encephalopathy: Secondary | ICD-10-CM | POA: Diagnosis not present

## 2020-03-31 DIAGNOSIS — Z20822 Contact with and (suspected) exposure to covid-19: Secondary | ICD-10-CM | POA: Insufficient documentation

## 2020-03-31 DIAGNOSIS — D500 Iron deficiency anemia secondary to blood loss (chronic): Secondary | ICD-10-CM | POA: Insufficient documentation

## 2020-03-31 DIAGNOSIS — R0902 Hypoxemia: Secondary | ICD-10-CM | POA: Diagnosis not present

## 2020-03-31 DIAGNOSIS — J189 Pneumonia, unspecified organism: Secondary | ICD-10-CM | POA: Diagnosis not present

## 2020-03-31 DIAGNOSIS — Z9889 Other specified postprocedural states: Secondary | ICD-10-CM

## 2020-03-31 DIAGNOSIS — G40909 Epilepsy, unspecified, not intractable, without status epilepticus: Secondary | ICD-10-CM | POA: Diagnosis not present

## 2020-03-31 DIAGNOSIS — K2289 Other specified disease of esophagus: Secondary | ICD-10-CM | POA: Diagnosis not present

## 2020-03-31 DIAGNOSIS — Z886 Allergy status to analgesic agent status: Secondary | ICD-10-CM | POA: Insufficient documentation

## 2020-03-31 DIAGNOSIS — Z888 Allergy status to other drugs, medicaments and biological substances status: Secondary | ICD-10-CM | POA: Insufficient documentation

## 2020-03-31 DIAGNOSIS — J9601 Acute respiratory failure with hypoxia: Principal | ICD-10-CM | POA: Insufficient documentation

## 2020-03-31 DIAGNOSIS — Z880 Allergy status to penicillin: Secondary | ICD-10-CM | POA: Insufficient documentation

## 2020-03-31 DIAGNOSIS — Z794 Long term (current) use of insulin: Secondary | ICD-10-CM | POA: Insufficient documentation

## 2020-03-31 DIAGNOSIS — Z885 Allergy status to narcotic agent status: Secondary | ICD-10-CM | POA: Insufficient documentation

## 2020-03-31 DIAGNOSIS — E041 Nontoxic single thyroid nodule: Secondary | ICD-10-CM | POA: Diagnosis not present

## 2020-03-31 DIAGNOSIS — R509 Fever, unspecified: Secondary | ICD-10-CM

## 2020-03-31 DIAGNOSIS — E871 Hypo-osmolality and hyponatremia: Secondary | ICD-10-CM | POA: Diagnosis not present

## 2020-03-31 DIAGNOSIS — J90 Pleural effusion, not elsewhere classified: Secondary | ICD-10-CM | POA: Diagnosis not present

## 2020-03-31 DIAGNOSIS — I2699 Other pulmonary embolism without acute cor pulmonale: Secondary | ICD-10-CM | POA: Insufficient documentation

## 2020-03-31 DIAGNOSIS — Z881 Allergy status to other antibiotic agents status: Secondary | ICD-10-CM | POA: Insufficient documentation

## 2020-03-31 DIAGNOSIS — R0789 Other chest pain: Secondary | ICD-10-CM | POA: Diagnosis present

## 2020-03-31 DIAGNOSIS — E1165 Type 2 diabetes mellitus with hyperglycemia: Secondary | ICD-10-CM

## 2020-03-31 DIAGNOSIS — E119 Type 2 diabetes mellitus without complications: Secondary | ICD-10-CM | POA: Diagnosis not present

## 2020-03-31 DIAGNOSIS — R079 Chest pain, unspecified: Secondary | ICD-10-CM | POA: Diagnosis not present

## 2020-03-31 DIAGNOSIS — R0602 Shortness of breath: Secondary | ICD-10-CM | POA: Diagnosis not present

## 2020-03-31 DIAGNOSIS — I700 Atherosclerosis of aorta: Secondary | ICD-10-CM | POA: Diagnosis not present

## 2020-03-31 DIAGNOSIS — Z853 Personal history of malignant neoplasm of breast: Secondary | ICD-10-CM | POA: Insufficient documentation

## 2020-03-31 MED ORDER — DIPHENHYDRAMINE HCL 25 MG PO CAPS
50.0000 mg | ORAL_CAPSULE | Freq: Once | ORAL | Status: AC
  Administered 2020-04-01: 50 mg via ORAL
  Filled 2020-03-31: qty 2

## 2020-03-31 MED ORDER — HYDROCORTISONE NA SUCCINATE PF 250 MG IJ SOLR
200.0000 mg | Freq: Once | INTRAMUSCULAR | Status: AC
  Administered 2020-03-31: 200 mg via INTRAVENOUS
  Filled 2020-03-31: qty 200

## 2020-03-31 MED ORDER — ACETAMINOPHEN 325 MG PO TABS
650.0000 mg | ORAL_TABLET | Freq: Once | ORAL | Status: AC
  Administered 2020-03-31: 650 mg via ORAL
  Filled 2020-03-31: qty 2

## 2020-03-31 MED ORDER — FENTANYL CITRATE (PF) 100 MCG/2ML IJ SOLN
50.0000 ug | Freq: Once | INTRAMUSCULAR | Status: AC
  Administered 2020-03-31: 50 ug via INTRAVENOUS
  Filled 2020-03-31: qty 2

## 2020-03-31 MED ORDER — SODIUM CHLORIDE 0.9 % IV SOLN
1.0000 g | Freq: Once | INTRAVENOUS | Status: AC
  Administered 2020-04-01: 1 g via INTRAVENOUS
  Filled 2020-03-31: qty 1

## 2020-03-31 MED ORDER — DIPHENHYDRAMINE HCL 50 MG/ML IJ SOLN: 50.0000 mg | Freq: Once | INTRAMUSCULAR | Status: AC

## 2020-03-31 MED ORDER — LACTATED RINGERS IV SOLN: INTRAVENOUS | Status: DC

## 2020-03-31 NOTE — ED Notes (Signed)
CTA can be done 4 hours prior to steroid admin, [id number]

## 2020-03-31 NOTE — ED Provider Notes (Signed)
[facility] EMERGENCY DEPARTMENT Provider Note   CSN: [id number] Arrival date & time: 03/31/20  2250   History Chief Complaint  Patient presents with  . Shortness of Breath    [name] is a 86 y.o. female.  The history is provided by the patient.  Shortness of Breath She has history of hypertension, diabetes, hyperlipidemia, asthma, breast cancer, colon cancer and comes in complaining of sharp right lateral chest pain which started yesterday and has been getting worse.  Pain is worse with taking a deep breath.  She has been short of breath.  Shortness of breath improved when EMS applied oxygen, but pain has not improved.  Pain is rated at 10/10.  EMS also noted she was hypoxic with oxygen saturation of 85% on room air, which improved following being placed on supplemental oxygen.  She denies fever, chills, sweats.  She denies any trauma.  She denies any cough.  Other than oxygen, she has not received any treatment.  Past Medical History:  Diagnosis Date  . Anemia   . Anxiety   . Asthma   . Breast cancer ([place]) 2014   right breast  . Cancer of right breast ([place]) 12/26/12   right breast 12:00 o'clock, DCIS  . Carotid artery disease ([employer])   . Carpal tunnel syndrome, bilateral   . Chronic bronchitis ([place])   . Chronic cough   . Chronic facial pain   . Chronic foot pain   . Colon cancer ([place]) dx'd 11/2019  . Complication of anesthesia    Sore jaw; could not chew or move mouth, prolonged sedation  . Convulsions/seizures ([place]) 10/16/2014  . Diabetes mellitus    type 2 niddm x 20 years  . Dyslipidemia   . Ejection fraction   . Gait abnormality 12/04/2019  . GERD (gastroesophageal reflux disease)   . Hammer toe    bilateral  . History of colonic polyps   . History of meningioma   . HTN (hypertension)   . Hx of radiation therapy 03/07/13- 03/29/13   right breast 4250 cGy 17 sessions  . Hyperlipidemia   . Hypokalemia   . Hyponatremia   . Hypothyroidism   . IBS  (irritable bowel syndrome)   . Melanoma ([place])   . Metatarsal bone fracture 2014  . Multiple drug allergies   . Nontoxic thyroid nodule   . Obesity   . Osteoarthritis   . Osteoporosis   . Palpitations   . Personal history of radiation therapy 2014  . Seasonal allergies   . Skin cancer   . Syncope   . Tremor, essential 08/18/2016  . Vitamin B12 deficiency   . Vitamin D deficiency     Patient Active Problem List   Diagnosis Date Noted  . Nausea & vomiting 03/12/2020  . Cancer of ascending colon pT3pN0 (0/12 LN) s/p lap right colectomy 03/06/2020 03/09/2020  . History of multiple allergies 03/09/2020  . S/P right hemicolectomy 03/06/2020  . Closed fracture of shaft of tibia 02/12/2020  . Gait abnormality 12/04/2019  . Impaired left ventricular function 09/05/2019  . Hypokalemia   . Effusion of left knee joint   . Hyponatremia   . Left patella fracture 10/26/2018  . Fracture of patella 10/26/2018  . Acquired hypothyroidism 08/25/2017  . 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 Hammer toe 08/25/2017  . History of breast cancer 08/25/2017  . Nontoxic thyroid nodule 08/25/2017  . Osteopetrosis 08/25/2017  . Dysuria 01/19/2017  . Vitamin D  deficiency 11/18/2016  . Tremor, essential 08/18/2016  . Convulsions/seizures ([place]) 10/16/2014  . Brain tumor (benign) ([place]) 10/18/2013  . Subdural hemorrhage ([place]) 10/09/2013  . Anxiety 10/09/2013  . Compression fracture of T12 vertebra ([place]) 10/09/2013  . Nontoxic multinodular goiter 09/05/2013  . Syncope 08/27/2013  . Carotid artery disease ([place]) 08/27/2013  . Abnormal thyroid ultrasound 08/27/2013  . Cancer of central portion of female breast ([place]) 12/30/2012  . Osteoporosis 03/06/2010  . ASTHMA 03/05/2009  . Asthma 03/05/2009  . IRRITABLE BOWEL SYNDROME  diarrhea type 03/21/2008  . ANEMIA-NOS 12/16/2006  . GERD 12/16/2006  . Insulin-requiring or dependent type II diabetes mellitus ([place]) 10/29/2006  . Mixed  hyperlipidemia 10/29/2006  . Essential hypertension 10/29/2006  . Allergic rhinitis, cause unspecified 10/29/2006  . OSTEOARTHRITIS 10/29/2006    Past Surgical History:  Procedure Laterality Date  . ABDOMINAL HYSTERECTOMY    . BIOPSY  11/07/2019   Procedure: BIOPSY;  Surgeon: Ronald Lobo, MD;  Location: [facility];  Service: Endoscopy;;  . BRAIN SURGERY    . BREAST BIOPSY Right 01/24/2013   Procedure: RE-EXCICION OF BREAST CANCER, ANTERIOR MARGINS;  Surgeon: Edward Jolly, MD;  Location: [facility];  Service: General;  Laterality: Right;  . BREAST LUMPECTOMY Right 2014  . BREAST LUMPECTOMY WITH NEEDLE LOCALIZATION Right 01/17/2013   Procedure: BREAST LUMPECTOMY WITH NEEDLE LOCALIZATION;  Surgeon: Edward Jolly, MD;  Location: [facility];  Service: General;  Laterality: Right;  . BUNIONECTOMY Bilateral   . CATARACT EXTRACTION W/ INTRAOCULAR LENS IMPLANT Right   . CHOLECYSTECTOMY    . COLONOSCOPY    . COLONOSCOPY WITH PROPOFOL N/A 11/07/2019   Procedure: COLONOSCOPY WITH PROPOFOL;  Surgeon: Ronald Lobo, MD;  Location: [facility];  Service: Endoscopy;  Laterality: N/A;  . CRANIOTOMY Right 10/18/2013   Procedure: CRANIOTOMY TUMOR EXCISION;  Surgeon: Floyce Stakes, MD;  Location: [facility];  Service: Neurosurgery;  Laterality: Right;  . ESOPHAGOGASTRODUODENOSCOPY (EGD) WITH PROPOFOL N/A 11/07/2019   Procedure: ESOPHAGOGASTRODUODENOSCOPY (EGD) WITH PROPOFOL;  Surgeon: Ronald Lobo, MD;  Location: [facility];  Service: Endoscopy;  Laterality: N/A;  . EYE SURGERY    . HERNIA REPAIR    . KNEE ARTHROSCOPY Bilateral   . LAPAROSCOPIC RIGHT HEMI COLECTOMY Right 03/06/2020   Procedure: LAPAROSCOPIC RIGHT HEMI COLECTOMY WITH TAP BLOCK AND LYSIS OF ADHESIONS;  Surgeon: Ileana Roup, MD;  Location: [facility];  Service: General;  Laterality: Right;  . POLYPECTOMY     small adenomatous  . ULNAR TUNNEL RELEASE       OB History   No obstetric history on file.     Family  History  Problem Relation Age of Onset  . Heart disease Mother   . Osteoporosis Mother   . Diabetes Father   . Pancreatic cancer Father   . Colon cancer Other   . Bone cancer Sister   . Prostate cancer Brother   . Colon cancer Brother   . Rectal cancer Sister   . Thyroid disease Sister        benign goiter resected    Social History   Tobacco Use  . Smoking status: Never Smoker  . Smokeless tobacco: Never Used  Vaping Use  . Vaping Use: Never used  Substance Use Topics  . Alcohol use: No  . Drug use: No    Home Medications Prior to Admission medications   Medication Sig Start Date End Date Taking? Authorizing Provider  ALPRAZolam (XANAX) 0.25 MG tablet TAKE 1 TABLET AT BEDTIME AS NEEDED (  CAN TAKE 1 TABLET DURING THE DAY IF NEEDED FOR ANXIETY) Patient taking differently: Take 0.25 mg by mouth at bedtime as needed for anxiety.  11/14/19   Ward Givens, NP  Biotin 1000 MCG tablet Take 1,000 mcg by mouth daily.     [provider]  Blood Glucose Monitoring Suppl (PRODIGY VOICE BLOOD GLUCOSE) w/Device KIT Use to check blood sugar 1 time per day. 10/18/15   Renato Shin, MD  Cholecalciferol (VITAMIN D-3) 125 MCG (5000 UT) TABS Take 5,000 Units by mouth in the morning and at bedtime.    [provider]  cholestyramine light (PREVALITE) 4 g packet Take 0.5 packets by mouth in the morning and at bedtime. 11/29/19   [provider]  Cranberry-Vitamin C-Probiotic (AZO CRANBERRY PO) Take 1 tablet by mouth daily.    [provider]  cyanocobalamin (,VITAMIN B-12,) 1000 MCG/ML injection INJECT 1 ML INTRAMUSCULARLY EVERY 21 DAYS Patient taking differently: Inject 1,000 mcg into the muscle every 21 ( twenty-one) days.  06/18/16   Hoyt Koch, MD  diclofenac Sodium (VOLTAREN) 1 % GEL Apply 2 g topically 4 (four) times daily as needed (pain.).  06/01/19   [provider]  diphenhydrAMINE (BENADRYL) 25 MG tablet Take 25-50 mg by mouth every 6  (six) hours as needed (runny nose/allergies.).     [provider]  docusate sodium (COLACE) 100 MG capsule Take 200 mg by mouth 2 (two) times daily as needed (constipation.).     [provider]  escitalopram (LEXAPRO) 10 MG tablet Take 10 mg by mouth daily.    [provider]  glucose blood test strip Use to check blood sugar 1 time per day. 10/22/15   Renato Shin, MD  insulin aspart (NOVOLOG) 100 UNIT/ML FlexPen Inject 2-3 Units into the skin 3 (three) times daily as needed (blood sugars greater than 150=2 units & 200=3 units). Sliding Scale Insulin 06/02/19   [provider]  insulin glargine (LANTUS) 100 UNIT/ML injection Inject 14 Units into the skin at bedtime.     [provider]  Insulin Pen Needle 32G X 4 MM MISC Used to inject insulin 3x daily 11/19/16   Renato Shin, MD  levothyroxine (SYNTHROID) 75 MCG tablet Take 75 mcg by mouth daily before breakfast.    [provider]  lidocaine (LIDODERM) 5 % Place 1 patch onto the skin daily. Remove & Discard patch within 12 hours or as directed by MD Patient taking differently: Place 1-2 patches onto the skin daily as needed (pain). Remove & Discard patch within 12 hours or as directed by MD 01/25/15   Hoyt Koch, MD  ondansetron (ZOFRAN-ODT) 4 MG disintegrating tablet Take 1 tablet (4 mg total) by mouth every 6 (six) hours as needed for nausea. 03/19/20   Ileana Roup, MD  phenytoin (DILANTIN) 100 MG ER capsule Take 1 capsule (100 mg) in the morning and Take 2 capsules (200 mg) at bedtime Patient taking differently: Take 100-200 mg by mouth See admin instructions. Take 1 capsule (100 mg) in the morning & Take 2 capsules (200 mg) at bedtime 05/16/19   Ward Givens, NP  phenytoin (DILANTIN) 50 MG tablet Chew 50 mg by mouth daily. Total morning dose=150 mg    [provider]  Polyethyl Glycol-Propyl Glycol (SYSTANE OP) Place 1 drop into both eyes in the morning, at  noon, in the evening, and at bedtime.     [provider]  primidone (MYSOLINE) 50 MG tablet Three tablets in  the morning and one at dinner time Patient taking differently: Take 100-150 mg by mouth See admin instructions. Take 3 tablets (150 mg) by mouth in the morning & take 2 tablets (100 mg) by mouth in the afternoon. 12/04/19   Kathrynn Ducking, MD  silver sulfADIAZINE (SILVADENE) 1 % cream Apply 1 application topically daily as needed (itching.).    [provider]  Thiamine HCl (VITAMIN B-1) 250 MG tablet Take 250 mg by mouth at bedtime.    [provider]  trandolapril-verapamil (TARKA) 2-240 MG tablet Take 1 tablet by mouth at bedtime.     [provider]    Allergies    Bystolic [nebivolol hcl], Cholestyramine, Hydrazine yellow [tartrazine], Morphine, Niacin, Niaspan [niacin er], Norvasc [amlodipine besylate], Optivar [azelastine hcl], Repaglinide, Sular [nisoldipine er], Tegretol [carbamazepine], Telmisartan, Amoxicillin-pot clavulanate, Cefdinir, Ciprofloxacin hcl, Clonidine, Clonidine hydrochloride, Codeine, Ezetimibe, Hydroxychloroquine, Naproxen, Sulfa antibiotics, Ziac [bisoprolol-hydrochlorothiazide], Azelastine, Elavil [amitriptyline], Empagliflozin, Hydralazine, Iodine, Kenalog [triamcinolone], Keppra [levetiracetam], Lamictal [lamotrigine], Lyrica [pregabalin], Pseudoephedrine, Topamax [topiramate], Ace inhibitors, Actonel [risedronate sodium], Amlodipine besylate, Aspirin, Atacand [candesartan], Bextra [valdecoxib], Bisoprolol-hydrochlorothiazide, Candesartan cilexetil, Cefadroxil, Celecoxib, Hydrocodone, Hydrocodone-acetaminophen, Iodinated diagnostic agents, Meloxicam, Methylprednisolone sodium succinate, Nabumetone, Penicillins, Pseudoephedrine-guaifenesin, Risedronate sodium, Rofecoxib, Ru-tuss [chlorphen-pse-atrop-hyos-scop], Sulfonamide derivatives, Sulphur [sulfur], Telithromycin, Terfenadine, Trandolapril-verapamil hcl er, Trandolapril-verapamil  hcl er, and Valium [diazepam]  Review of Systems   Review of Systems  Respiratory: Positive for shortness of breath.   All other systems reviewed and are negative.   Physical Exam Updated Vital Signs BP 122/60 (BP Location: Right Arm)   Pulse 80   Temp (!) 100.9 F (38.3 C) (Rectal)   Resp (!) 25   SpO2 100%   Physical Exam Vitals and nursing note reviewed.   86 year old female, resting comfortably and in no acute distress. Vital signs are significant for elevated temperature and respiratory rate. Oxygen saturation is 100%, which is normal. Head is normocephalic and atraumatic. PERRLA, EOMI. Oropharynx is clear. Neck is nontender and supple without adenopathy or JVD. Back is nontender and there is no CVA tenderness. Lungs are clear without rales, wheezes, or rhonchi. Chest is mildly tender in the right lateral rib cage. Heart has regular rate and rhythm without murmur. Abdomen is soft, flat, nontender without masses or hepatosplenomegaly and peristalsis is normoactive. Extremities have no cyanosis or edema, full range of motion is present. Skin is warm and dry without rash. Neurologic: Mental status is normal, cranial nerves are intact, there are no motor or sensory deficits.   ED Results / Procedures / Treatments   Labs (all labs ordered are listed, but only abnormal results are displayed) Labs Reviewed - No data to display  EKG EKG Interpretation  Date/Time:  Sunday March 31 2020 23:00:11 EST Ventricular Rate:  80 PR Interval:    QRS Duration: 96 QT Interval:  410 QTC Calculation: 473 R Axis:   -34 Text Interpretation: Sinus rhythm Left axis deviation Low voltage, precordial leads When compared with ECG of 03/12/2020, No significant change was found Confirmed by Delora Fuel ([id number]) on 03/31/2020 11:07:13 PM   Radiology DG Chest Port 1 View  Result Date: 03/31/2020 CLINICAL DATA:  Chest pain on the right EXAM: PORTABLE CHEST 1 VIEW COMPARISON:  03/12/2020  FINDINGS: Cardiac shadow is stable. Aortic calcifications are seen. The overall inspiratory effort is poor. Increasing density is noted over the right hemithorax consistent with a posteriorly layering effusion. Some patchy right basilar atelectasis is noted. Patchy left basilar atelectasis is seen as well. No bony abnormality is noted. IMPRESSION:  Mild bibasilar atelectasis with increasing right-sided effusion. Electronically Signed   By: Inez Catalina M.D.   On: 03/31/2020 23:24    Procedures Procedures   Medications Ordered in ED Medications - No data to display  ED Course  I have reviewed the triage vital signs and the nursing notes.  Pertinent labs & imaging results that were available during my care of the patient were reviewed by me and considered in my medical decision making (see chart for details).  MDM Rules/Calculators/A&P Fever with pleuritic chest pain worrisome for pneumonia.  Patient also has history of DVT, need to rule out pulmonary embolism.  She started on evolving sepsis pathway and will start evaluation with chest x-ray.  Old records are reviewed, and she has no relevant past visits.  Chest x-ray shows atelectasis and effusion, I am concerned about underlying pneumonia. Unfortunately, her list of allergies precludes everything that is part of the antibiotic selection from the sepsis order set. I have consulted with pharmacy and the decision was made to start her on meropenem, which she has received fairly recently without any reactions.  Labs show moderate hyponatremia which is not felt to be clinically significant.  Also, mild anemia which is not significantly changed from prior.  Respiratory panel is negative for Covid and influenza.  CT scan confirmed large pleural effusion and also, some deviation of the esophagus with concern for possible paraesophageal mass.  Effusion has developed since last chest x-ray of 03/12/2020.  This will need thoracentesis to evaluate the nature  of the effusion.  Case is discussed with Dr. Cyd Silence of [facility], who agrees to admit the patient.  Shelbylynn Walczyk Osterloh was evaluated in Emergency Department on 03/31/2020 for the symptoms described in the history of present illness. She was evaluated in the context of the global COVID-19 pandemic, which necessitated consideration that the patient might be at risk for infection with the SARS-CoV-2 virus that causes COVID-19. Institutional protocols and algorithms that pertain to the evaluation of patients at risk for COVID-19 are in a state of rapid change based on information released by regulatory bodies including the CDC and federal and state organizations. These policies and algorithms were followed during the patient's care in the ED.  Final Clinical Impression(s) / ED Diagnoses Final diagnoses:  Hypoxia  Pleural effusion on right  Fever in adult  Hyponatremia  Normochromic normocytic anemia    Rx / DC Orders ED Discharge Orders    None       Delora Fuel, MD 30/09/79 [phone]

## 2020-03-31 NOTE — ED Triage Notes (Signed)
PT coming from [place] with RT sided CP that is worse with movement. Pt with hx of DVT and provider at SNF would like pt evaluated for PE. Pt was found to be in mid 34's O2 sat and was placed on NRB to assist pt. Increased WOB noted. AAOX4

## 2020-04-01 ENCOUNTER — Emergency Department (HOSPITAL_COMMUNITY): Payer: Medicare Other

## 2020-04-01 ENCOUNTER — Encounter (HOSPITAL_COMMUNITY): Payer: Self-pay | Admitting: Internal Medicine

## 2020-04-01 ENCOUNTER — Observation Stay (HOSPITAL_COMMUNITY): Payer: Medicare Other

## 2020-04-01 ENCOUNTER — Other Ambulatory Visit: Payer: Self-pay

## 2020-04-01 DIAGNOSIS — I10 Essential (primary) hypertension: Secondary | ICD-10-CM

## 2020-04-01 DIAGNOSIS — J189 Pneumonia, unspecified organism: Secondary | ICD-10-CM | POA: Diagnosis not present

## 2020-04-01 DIAGNOSIS — Z794 Long term (current) use of insulin: Secondary | ICD-10-CM

## 2020-04-01 DIAGNOSIS — J948 Other specified pleural conditions: Secondary | ICD-10-CM | POA: Diagnosis not present

## 2020-04-01 DIAGNOSIS — G9341 Metabolic encephalopathy: Secondary | ICD-10-CM | POA: Diagnosis not present

## 2020-04-01 DIAGNOSIS — R9389 Abnormal findings on diagnostic imaging of other specified body structures: Secondary | ICD-10-CM | POA: Diagnosis not present

## 2020-04-01 DIAGNOSIS — J9601 Acute respiratory failure with hypoxia: Principal | ICD-10-CM

## 2020-04-01 DIAGNOSIS — C189 Malignant neoplasm of colon, unspecified: Secondary | ICD-10-CM | POA: Diagnosis not present

## 2020-04-01 DIAGNOSIS — G40909 Epilepsy, unspecified, not intractable, without status epilepticus: Secondary | ICD-10-CM | POA: Diagnosis not present

## 2020-04-01 DIAGNOSIS — E039 Hypothyroidism, unspecified: Secondary | ICD-10-CM

## 2020-04-01 DIAGNOSIS — E871 Hypo-osmolality and hyponatremia: Secondary | ICD-10-CM

## 2020-04-01 DIAGNOSIS — I2699 Other pulmonary embolism without acute cor pulmonale: Secondary | ICD-10-CM | POA: Diagnosis not present

## 2020-04-01 DIAGNOSIS — E1165 Type 2 diabetes mellitus with hyperglycemia: Secondary | ICD-10-CM

## 2020-04-01 DIAGNOSIS — J90 Pleural effusion, not elsewhere classified: Secondary | ICD-10-CM | POA: Diagnosis not present

## 2020-04-01 DIAGNOSIS — J9811 Atelectasis: Secondary | ICD-10-CM | POA: Diagnosis not present

## 2020-04-01 HISTORY — PX: IR THORACENTESIS ASP PLEURAL SPACE W/IMG GUIDE: IMG5380

## 2020-04-01 LAB — COMPREHENSIVE METABOLIC PANEL
ALT: 9 U/L (ref 0–44)
AST: 20 U/L (ref 15–41)
Albumin: 2 g/dL — ABNORMAL LOW (ref 3.5–5.0)
Alkaline Phosphatase: 74 U/L (ref 38–126)
Anion gap: 9 (ref 5–15)
BUN: 7 mg/dL — ABNORMAL LOW (ref 8–23)
CO2: 25 mmol/L (ref 22–32)
Calcium: 7.6 mg/dL — ABNORMAL LOW (ref 8.9–10.3)
Chloride: 93 mmol/L — ABNORMAL LOW (ref 98–111)
Creatinine, Ser: 0.7 mg/dL (ref 0.44–1.00)
GFR, Estimated: >60 mL/min (ref >60)
Glucose, Bld: 165 mg/dL — ABNORMAL HIGH (ref 70–99)
Potassium: 4 mmol/L (ref 3.5–5.1)
Sodium: 127 mmol/L — ABNORMAL LOW (ref 135–145)
Total Bilirubin: 0.7 mg/dL (ref 0.3–1.2)
Total Protein: 5.1 g/dL — ABNORMAL LOW (ref 6.5–8.1)

## 2020-04-01 LAB — URINALYSIS, ROUTINE W REFLEX MICROSCOPIC
Bilirubin Urine: NEGATIVE
Glucose, UA: NEGATIVE mg/dL
Hgb urine dipstick: NEGATIVE
Ketones, ur: NEGATIVE mg/dL
Nitrite: NEGATIVE
Protein, ur: NEGATIVE mg/dL
Specific Gravity, Urine: 1.038 — ABNORMAL HIGH (ref 1.005–1.030)
pH: 6 (ref 5.0–8.0)

## 2020-04-01 LAB — RESP PANEL BY RT-PCR (FLU A&B, COVID) ARPGX2
Influenza A by PCR: NEGATIVE
Influenza B by PCR: NEGATIVE
SARS Coronavirus 2 by RT PCR: NEGATIVE

## 2020-04-01 LAB — CBC WITH DIFFERENTIAL/PLATELET
Abs Immature Granulocytes: 0.05 10*3/uL (ref 0.00–0.07)
Basophils Absolute: 0.1 10*3/uL (ref 0.0–0.1)
Basophils Relative: 1 %
Eosinophils Absolute: 0.3 10*3/uL (ref 0.0–0.5)
Eosinophils Relative: 3 %
HCT: 32.1 % — ABNORMAL LOW (ref 36.0–46.0)
Hemoglobin: 10.4 g/dL — ABNORMAL LOW (ref 12.0–15.0)
Immature Granulocytes: 1 %
Lymphocytes Relative: 13 %
Lymphs Abs: 1.1 10*3/uL (ref 0.7–4.0)
MCH: 28.2 pg (ref 26.0–34.0)
MCHC: 32.4 g/dL (ref 30.0–36.0)
MCV: 87 fL (ref 80.0–100.0)
Monocytes Absolute: 0.8 10*3/uL (ref 0.1–1.0)
Monocytes Relative: 9 %
Neutro Abs: 6.3 10*3/uL (ref 1.7–7.7)
Neutrophils Relative %: 73 %
Platelets: 398 10*3/uL (ref 150–400)
RBC: 3.69 MIL/uL — ABNORMAL LOW (ref 3.87–5.11)
RDW: 21.4 % — ABNORMAL HIGH (ref 11.5–15.5)
WBC: 8.4 10*3/uL (ref 4.0–10.5)
nRBC: 0 % (ref 0.0–0.2)

## 2020-04-01 LAB — BODY FLUID CELL COUNT WITH DIFFERENTIAL
Lymphs, Fluid: 7 %
Monocyte-Macrophage-Serous Fluid: 10 % — ABNORMAL LOW (ref 50–90)
Neutrophil Count, Fluid: 83 % — ABNORMAL HIGH (ref 0–25)
Total Nucleated Cell Count, Fluid: 7350 cu mm — ABNORMAL HIGH (ref 0–1000)

## 2020-04-01 LAB — LACTATE DEHYDROGENASE, PLEURAL OR PERITONEAL FLUID: LD, Fluid: 363 U/L — ABNORMAL HIGH (ref 3–23)

## 2020-04-01 LAB — CBG MONITORING, ED
Glucose-Capillary: 136 mg/dL — ABNORMAL HIGH (ref 70–99)
Glucose-Capillary: 166 mg/dL — ABNORMAL HIGH (ref 70–99)

## 2020-04-01 LAB — PROTEIN, PLEURAL OR PERITONEAL FLUID: Total protein, fluid: <3 g/dL

## 2020-04-01 LAB — TROPONIN I (HIGH SENSITIVITY)
Troponin I (High Sensitivity): 5 ng/L (ref <18)
Troponin I (High Sensitivity): 5 ng/L (ref <18)

## 2020-04-01 LAB — APTT: aPTT: 46 seconds — ABNORMAL HIGH (ref 24–36)

## 2020-04-01 LAB — HIV ANTIBODY (ROUTINE TESTING W REFLEX): HIV Screen 4th Generation wRfx: NONREACTIVE

## 2020-04-01 LAB — GLUCOSE, CAPILLARY: Glucose-Capillary: 195 mg/dL — ABNORMAL HIGH (ref 70–99)

## 2020-04-01 LAB — PROTIME-INR
INR: 1.3 — ABNORMAL HIGH (ref 0.8–1.2)
Prothrombin Time: 15.5 seconds — ABNORMAL HIGH (ref 11.4–15.2)

## 2020-04-01 LAB — PHENYTOIN LEVEL, TOTAL: Phenytoin Lvl: 6.4 ug/mL — ABNORMAL LOW (ref 10.0–20.0)

## 2020-04-01 LAB — LACTIC ACID, PLASMA: Lactic Acid, Venous: 1.2 mmol/L (ref 0.5–1.9)

## 2020-04-01 MED ORDER — INSULIN ASPART 100 UNIT/ML ~~LOC~~ SOLN
0.0000 [IU] | Freq: Four times a day (QID) | SUBCUTANEOUS | Status: DC
  Administered 2020-04-02: 3 [IU] via SUBCUTANEOUS

## 2020-04-01 MED ORDER — IOHEXOL 350 MG/ML SOLN
75.0000 mL | Freq: Once | INTRAVENOUS | Status: AC | PRN
  Administered 2020-04-01: 75 mL via INTRAVENOUS

## 2020-04-01 MED ORDER — LACTATED RINGERS IV SOLN: INTRAVENOUS | Status: AC

## 2020-04-01 MED ORDER — OXYCODONE HCL 5 MG PO TABS
5.0000 mg | ORAL_TABLET | Freq: Four times a day (QID) | ORAL | Status: DC | PRN
  Administered 2020-04-01 – 2020-04-02 (×2): 5 mg via ORAL
  Filled 2020-04-01 (×2): qty 1

## 2020-04-01 MED ORDER — PHENYTOIN SODIUM 50 MG/ML IJ SOLN
100.0000 mg | Freq: Every day | INTRAMUSCULAR | Status: DC
  Administered 2020-04-01 – 2020-04-02 (×2): 100 mg via INTRAVENOUS
  Filled 2020-04-01 (×3): qty 2

## 2020-04-01 MED ORDER — ALPRAZOLAM 0.25 MG PO TABS
0.2500 mg | ORAL_TABLET | Freq: Every day | ORAL | Status: DC
  Administered 2020-04-01: 0.25 mg via ORAL
  Filled 2020-04-01: qty 1

## 2020-04-01 MED ORDER — LIDOCAINE HCL 1 % IJ SOLN
INTRAMUSCULAR | Status: AC | PRN
  Administered 2020-04-01: 10 mL

## 2020-04-01 MED ORDER — LIDOCAINE HCL 1 % IJ SOLN
INTRAMUSCULAR | Status: AC
  Filled 2020-04-01: qty 20

## 2020-04-01 MED ORDER — SODIUM CHLORIDE 0.9 % IV SOLN
200.0000 mg | Freq: Every day | INTRAVENOUS | Status: DC
  Administered 2020-04-01: 200 mg via INTRAVENOUS
  Filled 2020-04-01 (×5): qty 4

## 2020-04-01 MED ORDER — LEVOTHYROXINE SODIUM 100 MCG/5ML IV SOLN: 25.0000 ug | Freq: Every day | INTRAVENOUS | Status: DC

## 2020-04-01 MED ORDER — SODIUM CHLORIDE 0.9 % IV SOLN
1.0000 g | Freq: Two times a day (BID) | INTRAVENOUS | Status: DC
  Administered 2020-04-01 – 2020-04-02 (×3): 1 g via INTRAVENOUS
  Filled 2020-04-01 (×4): qty 1

## 2020-04-01 NOTE — Progress Notes (Signed)
PROGRESS NOTE    [name]  MRN:[id number] DOB: 03/13/1930 DOA: 03/31/2020 PCP: Boyd, Tammy Lamonica, MD      Brief Narrative:  [name] is a 86 y.o. F with hx recent dx adenoCA colon s/p resection complicated by post-ob bleeding, then readmission due to hemoperitoneum and ileus, as well as DM, HTN, SDH in 2015, chronic hyponatremia, hypothyroidism, remote BrCA, hx brain mass s/p resection and seizures who presented with several days progressive SOB and chest pain.  SpO2 <90% with EMS.  In the ER, temp 100.9F, CTA chest showed large pleural effusion, subtotal collapse of the left lower lobe and complete collapse of the right lower lobe, paraesophageal mass, resolving right upper lobe segmental PE, and pneumonia.        Assessment & Plan:  Acute respiratory failure with hypoxia due to right-sided pleural effusion and pneumonia of the right lung -Thoracentesis ordered -Continue meropenem   Acute metabolic encephalopathy -SLP eval  Subacute pulmonary embolism This appears to be subacute to chronic and resolving per imaging findings.  Also given the patient's recent life-threatening hemorrhage, I agree with avoiding anticoagulation, recognizing the risks that poses. -Consult palliative care  Paraesophageal mass -Follow-up CT chest with contrast  Resolving blood loss anemia due to adenocarcinoma of the colon resection Recent diagnosis colon cancer I have notified general surgery of the patient's presence.  No specific surgery needs at this time.  Diabetes -Sliding scale corrections -Hold home Lantus for now  Hypertension -Hold ACE, verapamil until hemodynamics clear  Hyponatremia Stable relative to baseline  Hypothyroidism -Start levothyroxine in 3 days if still not able to take p.o.  Seizure disorder -Continue Dilantin        Disposition: Status is: Observation  The patient will require care spanning > 2 midnights and should be moved to inpatient  because: IV treatments appropriate due to intensity of illness or inability to take PO  Dispo: The patient is from: SNF              Anticipated d/c is to: SNF              Anticipated d/c date is: 1 day              Patient currently is not medically stable to d/c.              MDM: This is a no charge note.  For further details, please see H&P by my partner Dr. Shalhoub from earlier today.  The below labs and imaging reports were reviewed and summarized above.    DVT prophylaxis: SCDs Start: 04/01/20 0655  Code Status: FULL Family Communication: Niece by phone    Consultants:   Palliative care  Procedures:   11/29 thoracentesis  Antimicrobials:   11/28 meropenem>>  Culture data:   11/28 blood culture x2  11/28 urine culture  11/29 thoracentesis culture          Subjective: Patient very weak and tired.  No fever.        Objective: Vitals:   04/01/20 0900 04/01/20 0930 04/01/20 0956 04/01/20 1053  BP: (!) 107/59 (!) 114/97 122/64 120/84  Pulse: 61 68  73  Resp: 17 16  18  Temp:      TempSrc:      SpO2: 100% 99%  98%  Weight:      Height:        Intake/Output Summary (Last 24 hours) at 04/01/2020 1135 Last data filed at 04/01/2020 1051 Gross per 24   hour  Intake 1765.57 ml  Output --  Net 1765.57 ml   Filed Weights   04/01/20 0014  Weight: 70.8 kg    Examination: The patient was seen and examined.      Data Reviewed: I have personally reviewed following labs and imaging studies:  CBC: Recent Labs  Lab 03/31/20 0018  WBC 8.4  NEUTROABS 6.3  HGB 10.4*  HCT 32.1*  MCV 87.0  PLT 660   Basic Metabolic Panel: Recent Labs  Lab 03/31/20 0019  NA 127*  K 4.0  CL 93*  CO2 25  GLUCOSE 165*  BUN 7*  CREATININE 0.70  CALCIUM 7.6*   GFR: Estimated Creatinine Clearance: 47.3 mL/min (by C-G formula based on SCr of 0.7 mg/dL). Liver Function Tests: Recent Labs  Lab 03/31/20 0019  AST 20  ALT 9  ALKPHOS 74   BILITOT 0.7  PROT 5.1*  ALBUMIN 2.0*   No results for input(s): LIPASE, AMYLASE in the last 168 hours. No results for input(s): AMMONIA in the last 168 hours. Coagulation Profile: Recent Labs  Lab 03/31/20 0019  INR 1.3*   Cardiac Enzymes: No results for input(s): CKTOTAL, CKMB, CKMBINDEX, TROPONINI in the last 168 hours. BNP (last 3 results) No results for input(s): PROBNP in the last 8760 hours. HbA1C: No results for input(s): HGBA1C in the last 72 hours. CBG: No results for input(s): GLUCAP in the last 168 hours. Lipid Profile: No results for input(s): CHOL, HDL, LDLCALC, TRIG, CHOLHDL, LDLDIRECT in the last 72 hours. Thyroid Function Tests: No results for input(s): TSH, T4TOTAL, FREET4, T3FREE, THYROIDAB in the last 72 hours. Anemia Panel: No results for input(s): VITAMINB12, FOLATE, FERRITIN, TIBC, IRON, RETICCTPCT in the last 72 hours. Urine analysis:    Component Value Date/Time   COLORURINE YELLOW 03/13/2020 [place] 03/13/2020 1112   LABSPEC 1.013 03/13/2020 1112   PHURINE 5.0 03/13/2020 1112   GLUCOSEU 50 (A) 03/13/2020 1112   GLUCOSEU NEGATIVE 12/28/2013 1036   HGBUR NEGATIVE 03/13/2020 1112   HGBUR trace-intact 06/29/2007 1304   BILIRUBINUR NEGATIVE 03/13/2020 1112   BILIRUBINUR negative 01/19/2017 1049   KETONESUR 5 (A) 03/13/2020 1112   PROTEINUR NEGATIVE 03/13/2020 1112   UROBILINOGEN 0.2 01/19/2017 1049   UROBILINOGEN 1.0 03/25/2015 1755   NITRITE NEGATIVE 03/13/2020 1112   LEUKOCYTESUR NEGATIVE 03/13/2020 1112   Sepsis Labs: _0 (procalcitonin:4,lacticacidven:4)  ) Recent Results (from the past 240 hour(s))  Resp Panel by RT-PCR (Flu A&B, Covid) Nasopharyngeal Swab     Status: None   Collection Time: 03/31/20 11:49 PM   Specimen: Nasopharyngeal Swab; Nasopharyngeal(NP) swabs in vial transport medium  Result Value Ref Range Status   SARS Coronavirus 2 by RT PCR NEGATIVE NEGATIVE Final    Comment: (NOTE) SARS-CoV-2  target nucleic acids are NOT DETECTED.  The SARS-CoV-2 RNA is generally detectable in upper respiratory specimens during the acute phase of infection. The lowest concentration of SARS-CoV-2 viral copies this assay can detect is 138 copies/mL. A negative result does not preclude SARS-Cov-2 infection and should not be used as the sole basis for treatment or other patient management decisions. A negative result [place] occur with  improper specimen collection/handling, submission of specimen other than nasopharyngeal swab, presence of viral mutation(s) within the areas targeted by this assay, and inadequate number of viral copies(<138 copies/mL). A negative result must be combined with clinical observations, patient history, and epidemiological information. The expected result is Negative.  Fact Sheet for Patients:  EntrepreneurPulse.com.au  Fact Sheet for Healthcare Providers:  https://www.fda.gov/media/152162/download  This test is no t yet approved or cleared by the [place] FDA and  has been authorized for detection and/or diagnosis of SARS-CoV-2 by FDA under an Emergency Use Authorization (EUA). This EUA will remain  in effect (meaning this test can be used) for the duration of the COVID-19 declaration under Section 564(b)(1) of the Act, 21 U.S.C.section 360bbb-3(b)(1), unless the authorization is terminated  or revoked sooner.       Influenza A by PCR NEGATIVE NEGATIVE Final   Influenza B by PCR NEGATIVE NEGATIVE Final    Comment: (NOTE) The Xpert Xpress SARS-CoV-2/FLU/RSV plus assay is intended as an aid in the diagnosis of influenza from Nasopharyngeal swab specimens and should not be used as a sole basis for treatment. Nasal washings and aspirates are unacceptable for Xpert Xpress SARS-CoV-2/FLU/RSV testing.  Fact Sheet for Patients: https://www.fda.gov/media/152166/download  Fact Sheet for Healthcare  Providers: https://www.fda.gov/media/152162/download  This test is not yet approved or cleared by the [place] FDA and has been authorized for detection and/or diagnosis of SARS-CoV-2 by FDA under an Emergency Use Authorization (EUA). This EUA will remain in effect (meaning this test can be used) for the duration of the COVID-19 declaration under Section 564(b)(1) of the Act, 21 U.S.C. section 360bbb-3(b)(1), unless the authorization is terminated or revoked.  Performed at [facility], [address] [place], [place] [address]          Radiology Studies: DG Chest 1 View  Result Date: 04/01/2020 CLINICAL DATA:  Post thoracentesis EXAM: CHEST  1 VIEW COMPARISON:  03/31/2020 FINDINGS: Decreased right pleural effusion and improved aeration of the right lung post thoracentesis. No pneumothorax low lung volumes with bibasilar atelectasis. Similar cardiomediastinal contours. IMPRESSION: Improved aeration of the right lung post thoracentesis. No pneumothorax. Bibasilar atelectasis. Electronically Signed   By: Praneil  Patel M.D.   On: 04/01/2020 11:08   CT Angio Chest PE W and/or Wo Contrast  Result Date: 04/01/2020 CLINICAL DATA:  Chest pain. History of DVT, right breast cancer status post radiation therapy, chronic cough, chronic bronchitis, asthma. EXAM: CT ANGIOGRAPHY CHEST WITH CONTRAST TECHNIQUE: Multidetector CT imaging of the chest was performed using the standard protocol during bolus administration of intravenous contrast. Multiplanar CT image reconstructions and MIPs were obtained to evaluate the vascular anatomy. CONTRAST:  75mL OMNIPAQUE IOHEXOL 350 MG/ML SOLN COMPARISON:  None. FINDINGS: Cardiovascular: There is excellent opacification of the pulmonary arterial tree. There is a single web-like intraluminal filling defect identified within the apical segmental pulmonary artery of the right upper lobe most in keeping with subacute to chronic pulmonary embolism. There is  no other intraluminal filling defect identified and no evidence of acute pulmonary embolism. The central pulmonary arteries are of normal caliber. Mild coronary artery calcification. Global cardiac size within normal limits. No pericardial effusion. Mild atherosclerotic calcifications seen within the thoracic aorta. No aortic aneurysm. Mediastinum/Nodes: 2.2 cm left thyroid nodule is stable since prior examination of 02/17/2020 and is similar to that seen on prior thyroid sonogram of 08/30/2013. No pathologic thoracic adenopathy. The esophagus demonstrates significant leftward deviation and there is a possible paraesophageal mass within the retrocardiac region, best seen at the level of the left pulmonary artery at axial image # 56. Given its relatively rapid development since prior examination, this most likely represents pleural fluid or paraesophageal adenopathy. Lungs/Pleura: Large right pleural effusion has developed with compressive atelectasis of the right lung. Small left pleural effusion is present with subtotal collapse of the left lower lobe. No   pneumothorax. No central obstructing mass. Upper Abdomen: No acute abnormality. Musculoskeletal: Asymmetric degenerative changes at the right sternoclavicular junction are unchanged. Remote L1 vertebroplasty is partially visualized. No acute bone abnormality. Stable loss of height of T9 vertebral body. Review of the MIP images confirms the above findings. IMPRESSION: No acute pulmonary embolism. Tiny web-like filling defect within the right apical upper lobe segmental pulmonary artery most in keeping with subacute to chronic pulmonary embolus. Interval development of large right and small left pleural effusions with associated subtotal collapse of the left lower lobe and compressive atelectasis of the right lung with complete collapse of the right lower lobe. Deviation of the esophagus with suggestion of a paraesophageal mass within the retrocardiac region. This  would be better assessed with a standard contrast enhanced CT examination of the chest following resolution or evacuation of the patient's pleural effusion. Alternatively, this could be further assessed with PET CT examination. Stable 2.2 cm left thyroid nodule, unlikely of clinical significance. Aortic Atherosclerosis (ICD10-I70.0). Electronically Signed   By: Fidela Salisbury MD   On: 04/01/2020 05:19   DG Chest Port 1 View  Result Date: 03/31/2020 CLINICAL DATA:  Chest pain on the right EXAM: PORTABLE CHEST 1 VIEW COMPARISON:  03/12/2020 FINDINGS: Cardiac shadow is stable. Aortic calcifications are seen. The overall inspiratory effort is poor. Increasing density is noted over the right hemithorax consistent with a posteriorly layering effusion. Some patchy right basilar atelectasis is noted. Patchy left basilar atelectasis is seen as well. No bony abnormality is noted. IMPRESSION: Mild bibasilar atelectasis with increasing right-sided effusion. Electronically Signed   By: Inez Catalina M.D.   On: 03/31/2020 23:24        Scheduled Meds: . insulin aspart  0-15 Units Subcutaneous Q6H  . [START ON 04/04/2020] levothyroxine  25 mcg Intravenous Daily  . lidocaine      . phenytoin (DILANTIN) IV  100 mg Intravenous Daily   Continuous Infusions: . lactated ringers Stopped (04/01/20 1051)  . meropenem (MERREM) IV    . phenytoin (DILANTIN) IV       LOS: 0 days    Time spent: 20 minutes    Edwin Dada, MD [facility] 04/01/2020, 11:35 AM     Please page though [place] or Epic secure chat:  For password, contact charge nurse

## 2020-04-01 NOTE — Progress Notes (Signed)
Pharmacy Antibiotic Note  [name] is a 86 y.o. female admitted on 03/31/2020 with pneumonia.  Pharmacy has been consulted for Meropenem dosing. Patient with numerous allergies, tolerated Meropenem overnight. CrCl - 47 ml/min.  Plan: Meropenem 1 gm IV q12hr Monitor renal function and C&S.  Height: 5\' 7"  (170.2 cm) Weight: 70.8 kg (156 lb 1.4 oz) IBW/kg (Calculated) : 61.6  Temp (24hrs), Avg:99.4 F (37.4 C), Min:97.8 F (36.6 C), Max:100.9 F (38.3 C)  Recent Labs  Lab 03/31/20 0018 03/31/20 0019  WBC 8.4  --   CREATININE  --  0.70  LATICACIDVEN 1.2  --     Estimated Creatinine Clearance: 47.3 mL/min (by C-G formula based on SCr of 0.7 mg/dL).    Allergies  Allergen Reactions  . Bystolic [Nebivolol Hcl] Other (See Comments)    "extreme weakness, heaviness in legs & arms, swelling in legs/arms/face, swollen abdomen, pain in bladder, feet pain, soreness in chest"  . Cholestyramine     "itching rash on stomach, bloated, nausea, vomiting, sleeplessness, extreme pain in arms"  . Hydrazine Yellow [Tartrazine] Other (See Comments)    "does not reduce high blood pressure, pain in arm, high pressure, felt like I was on verge of heart attack, really weak"  . Morphine Other (See Comments)    Feels morbid, weak, still in pain  . Niacin Palpitations    Fast heart beat  . Niaspan [Niacin Er] Palpitations and Other (See Comments)    "fast heart beat, high blood pressure"  . Norvasc [Amlodipine Besylate] Other (See Comments)    "extreme fluid retention/pain)  . Optivar [Azelastine Hcl] Photosensitivity  . Repaglinide Hives  . Sular [Nisoldipine Er] Other (See Comments)    "severe headaches, swelling eyes, hands, feet, shortness of breath, weak, flushed face, brain boiling, fluid retention, high blood sugar, nervous, heart fast beating"  . Tegretol [Carbamazepine] Other (See Comments)    Blood poisoning   . Telmisartan Other (See Comments)    "headache, difficulty urinating, high  blood sugar, fluid retention"  . Amoxicillin-Pot Clavulanate Rash  . Cefdinir Swelling    Vaginal irritation, breathing,   . Ciprofloxacin Hcl Hives  . Clonidine Other (See Comments)    Dry mouth, fluid retention  . Clonidine Hydrochloride Other (See Comments)    Dry mouth, fluid retention  . Codeine Nausea And Vomiting  . Ezetimibe Other (See Comments)    Made weak  . Hydroxychloroquine Other (See Comments)    Low platlets  . Naproxen Other (See Comments)    Shrinks bladder  . Sulfa Antibiotics Rash  . Ziac [Bisoprolol-Hydrochlorothiazide] Other (See Comments)    "stopped urination"  . Azelastine Other (See Comments)  . Elavil [Amitriptyline] Other (See Comments)    Gave Pt nightmares  . Empagliflozin Other (See Comments)    "Caused yeast infection, slowed my urine"  . Hydralazine   . Iodine   . Kenalog [Triamcinolone] Diarrhea  . Keppra [Levetiracetam] Other (See Comments)    Shaking  . Lamictal [Lamotrigine]     itching  . Lyrica [Pregabalin] Swelling  . Pseudoephedrine   . Topamax [Topiramate]     Dry eyes  . Ace Inhibitors Other (See Comments)    unknown  . Actonel [Risedronate Sodium] Other (See Comments)    unknown  . Amlodipine Besylate Other (See Comments)    unknown  . Aspirin Other (See Comments)    unknown  . Atacand [Candesartan] Other (See Comments)    unknown  . Bextra [Valdecoxib] Other (See Comments)  unknown  . Bisoprolol-Hydrochlorothiazide Other (See Comments)    unknown  . Candesartan Cilexetil Other (See Comments)    unknown  . Cefadroxil Other (See Comments)    unknown  . Celecoxib Rash  . Hydrocodone Other (See Comments)    unknown  . Hydrocodone-Acetaminophen Other (See Comments)    unknown  . Iodinated Diagnostic Agents Rash    "All over" "All over"  . Meloxicam Other (See Comments)    unknown  . Methylprednisolone Sodium Succinate Other (See Comments)    unknown  . Nabumetone Other (See Comments)    unknown  . Penicillins  Other (See Comments)    unknown  . Pseudoephedrine-Guaifenesin Other (See Comments)    unknown  . Risedronate Sodium Other (See Comments)    unknown  . Rofecoxib Other (See Comments)    unknown  . Ru-Tuss [Chlorphen-Pse-Atrop-Hyos-Scop] Other (See Comments)    unknown  . Sulfonamide Derivatives Other (See Comments)    unknown  . Sulphur [Sulfur] Other (See Comments)    unknown  . Telithromycin Other (See Comments)    unknown  . Terfenadine Other (See Comments)    unknown  . Trandolapril-Verapamil Hcl Er Other (See Comments)    Headache, difficulty urinating, high blood sugar, fluid retention  Pt is taking Tarka (trandolapril-verapamil) currently, but requests the medication stay in her allergy list  . Trandolapril-Verapamil Hcl Er Other (See Comments)    Headache, difficulty urinating, high blood sugar, fluid retention  Pt is taking Tarka (trandolapril-verapamil) currently, but requests the medication stay in her allergy list  . Valium [Diazepam] Other (See Comments)    Makes her mean and hyper    Antimicrobials this admission: Merrem 11/28 >>   Thank you for allowing pharmacy to be a part of this patient's care.  Alanda Slim, PharmD, [facility] Clinical Pharmacist Please see AMION for all Pharmacists' Contact Phone Numbers 04/01/2020, 7:09 AM

## 2020-04-01 NOTE — Evaluation (Signed)
Clinical/Bedside Swallow Evaluation Patient Details  Name: [name] MRN: [id number] Date of Birth: Brazell 25, 1931  Today's Date: 04/01/2020 Time: SLP Start Time (ACUTE ONLY): 4132 SLP Stop Time (ACUTE ONLY): 1050 SLP Time Calculation (min) (ACUTE ONLY): 10 min  Past Medical History:  Past Medical History:  Diagnosis Date  . Anemia   . Anxiety   . Asthma   . Breast cancer ([place]) 2014   right breast  . Cancer of right breast ([place]) 12/26/12   right breast 12:00 o'clock, DCIS  . Carotid artery disease ([place])   . Carpal tunnel syndrome, bilateral   . Chronic bronchitis ([place])   . Chronic cough   . Chronic facial pain   . Chronic foot pain   . Colon cancer ([place]) dx'd 11/2019  . Complication of anesthesia    Sore jaw; could not chew or move mouth, prolonged sedation  . Convulsions/seizures ([place]) 10/16/2014  . Diabetes mellitus    type 2 niddm x 20 years  . Dyslipidemia   . Ejection fraction   . Gait abnormality 12/04/2019  . GERD (gastroesophageal reflux disease)   . Hammer toe    bilateral  . History of colonic polyps   . History of meningioma   . HTN (hypertension)   . Hx of radiation therapy 03/07/13- 03/29/13   right breast 4250 cGy 17 sessions  . Hyperlipidemia   . Hypokalemia   . Hyponatremia   . Hypothyroidism   . IBS (irritable bowel syndrome)   . Melanoma ([place])   . Metatarsal bone fracture 2014  . Multiple drug allergies   . Nontoxic thyroid nodule   . Obesity   . Osteoarthritis   . Osteoporosis   . Palpitations   . Personal history of radiation therapy 2014  . Seasonal allergies   . Skin cancer   . Syncope   . Tremor, essential 08/18/2016  . Vitamin B12 deficiency   . Vitamin D deficiency    Past Surgical History:  Past Surgical History:  Procedure Laterality Date  . ABDOMINAL HYSTERECTOMY    . BIOPSY  11/07/2019   Procedure: BIOPSY;  Surgeon: Ronald Lobo, MD;  Location: [facility];  Service: Endoscopy;;  . BRAIN SURGERY    . BREAST BIOPSY Right  01/24/2013   Procedure: RE-EXCICION OF BREAST CANCER, ANTERIOR MARGINS;  Surgeon: Edward Jolly, MD;  Location: [facility];  Service: General;  Laterality: Right;  . BREAST LUMPECTOMY Right 2014  . BREAST LUMPECTOMY WITH NEEDLE LOCALIZATION Right 01/17/2013   Procedure: BREAST LUMPECTOMY WITH NEEDLE LOCALIZATION;  Surgeon: Edward Jolly, MD;  Location: [facility];  Service: General;  Laterality: Right;  . BUNIONECTOMY Bilateral   . CATARACT EXTRACTION W/ INTRAOCULAR LENS IMPLANT Right   . CHOLECYSTECTOMY    . COLONOSCOPY    . COLONOSCOPY WITH PROPOFOL N/A 11/07/2019   Procedure: COLONOSCOPY WITH PROPOFOL;  Surgeon: Ronald Lobo, MD;  Location: [facility];  Service: Endoscopy;  Laterality: N/A;  . CRANIOTOMY Right 10/18/2013   Procedure: CRANIOTOMY TUMOR EXCISION;  Surgeon: Floyce Stakes, MD;  Location: [facility];  Service: Neurosurgery;  Laterality: Right;  . ESOPHAGOGASTRODUODENOSCOPY (EGD) WITH PROPOFOL N/A 11/07/2019   Procedure: ESOPHAGOGASTRODUODENOSCOPY (EGD) WITH PROPOFOL;  Surgeon: Ronald Lobo, MD;  Location: [facility];  Service: Endoscopy;  Laterality: N/A;  . EYE SURGERY    . HERNIA REPAIR    . KNEE ARTHROSCOPY Bilateral   . LAPAROSCOPIC RIGHT HEMI COLECTOMY Right 03/06/2020   Procedure: LAPAROSCOPIC RIGHT HEMI COLECTOMY WITH TAP BLOCK AND LYSIS OF  ADHESIONS;  Surgeon: Ileana Roup, MD;  Location: [facility];  Service: General;  Laterality: Right;  . POLYPECTOMY     small adenomatous  . ULNAR TUNNEL RELEASE     HPI:  86 year old female with PMH of chronic hyponatremia, diabetes mellitus type 2, hypothyroidism, hypertension, hyperlipidemia, remote history of breast cancer, brain tumors status post resection resulting in seizure disorder and recent diagnosis of adenocarcinoma of the colon s/p recent right hemicolectomy. presents to ED via EMS for complaints of shortness of breath and chest pain. Substantial right-sided pleural effusion identified on CXR resulting in  subtotal collapse of the left lower lobe and complete collapse of the right lower lobe with deviation of the esophagus to the left reviewing the radiologist to be concerned for a possible underlying paraesophageal mass.    Assessment / Plan / Recommendation Clinical Impression  Pt was seen for BSE. She was lethargic and required min verbal cues to accept POs. She accepted POs of thin liquid, puree, and solids without any s/sx of aspiration. Recommend regular diet and thin liquids- SLP will sign off at this time.  SLP Visit Diagnosis: Dysphagia, unspecified (R13.10)    Aspiration Risk  Mild aspiration risk    Diet Recommendation Regular;Thin liquid   Liquid Administration via: Straw;Cup Medication Administration: Whole meds with liquid Supervision: Staff to assist with self feeding Postural Changes: Seated upright at 90 degrees    Other  Recommendations Oral Care Recommendations: Oral care BID   Follow up Recommendations        Frequency and Duration            Prognosis        Swallow Study   General HPI: 86 year old female with PMH of chronic hyponatremia, diabetes mellitus type 2, hypothyroidism, hypertension, hyperlipidemia, remote history of breast cancer, brain tumors status post resection resulting in seizure disorder and recent diagnosis of adenocarcinoma of the colon s/p recent right hemicolectomy. presents to ED via EMS for complaints of shortness of breath and chest pain. Substantial right-sided pleural effusion identified on CXR resulting in subtotal collapse of the left lower lobe and complete collapse of the right lower lobe with deviation of the esophagus to the left reviewing the radiologist to be concerned for a possible underlying paraesophageal mass.  Type of Study: Bedside Swallow Evaluation Diet Prior to this Study: NPO Temperature Spikes Noted: No Respiratory Status: Nasal cannula History of Recent Intubation: No Behavior/Cognition:  Alert;Lethargic/Drowsy Oral Cavity Assessment: Within Functional Limits Oral Care Completed by SLP: No Oral Cavity - Dentition: Adequate natural dentition Vision: Impaired for self-feeding Self-Feeding Abilities: Needs assist Patient Positioning: Upright in bed Baseline Vocal Quality: Normal    Oral/Motor/Sensory Function Overall Oral Motor/Sensory Function: Within functional limits   Ice Chips Ice chips: Not tested   Thin Liquid Thin Liquid: Within functional limits    Nectar Thick Nectar Thick Liquid: Not tested   Honey Thick Honey Thick Liquid: Not tested   Puree Puree: Within functional limits   Solid     Solid: Within functional limits      Greggory Keen 04/01/2020,11:01 AM

## 2020-04-01 NOTE — Procedures (Signed)
PROCEDURE SUMMARY:  Successful US guided right thoracentesis. Yielded 800 mL of thin bloody pleural  fluid. Pt tolerated procedure well. No immediate complications.  Specimen was sent for labs. CXR ordered.  EBL < 5 mL  Ascencion Dike PA-C 04/01/2020 10:14 AM

## 2020-04-01 NOTE — ED Notes (Signed)
Patient transported to 5M05 on stretcher with all belongings. She is alert and oriented x 4 with no distress at this time. Pure wick capped. Patient on room air and VSS. Report to Cathy< Rn

## 2020-04-01 NOTE — H&P (Signed)
History and Physical    [name] ZJQ:[id number] DOB: [birth year] DOA: 03/31/2020  PCP: Bartholome Bill, MD  Patient coming from: [facility] via EMS   Chief Complaint:  Chief Complaint  Patient presents with  . Shortness of Breath     HPI:  86 year old female with past medical history of chronic hyponatremia, insulin-dependent diabetes mellitus type 2, hypothyroidism, hypertension, hyperlipidemia, remote history of breast cancer, brain tumors status post resection resulting in seizure disorder and recent diagnosis of adenocarcinoma of the colon status post recent right hemicolectomy who presents to [facility] emergency department via EMS for complaints of shortness of breath and chest pain.    Of note, patient has had 2 recent hospitalizations: First being from 11/3-11/9 where the patient underwent a laparoscopic right-sided hemicolectomy by Dr. Dema Severin for removal of an identified adenocarcinoma of the ascending colon P EGD in July 2021.  Postoperative course was complicated by bleeding requiring 2 unit packed red blood cell transfusion.  Patient was eventually discharged on 11/9 but unfortunately had to return 24 hours later and be readmitted due to worsening abdominal pain.  Patient was found to have a hemoperitoneum with acute blood loss anemia and associated ileus.  Patient again required blood transfusion and eventually stabilized.  Diet was advanced and the patient was eventually discharged on 11/18 to [facility].  Patient is currently extremely lethargic and unable to provide a history.  Majority of history is been obtained from the emergency department provider and review of associated notes.   Patient has been experiencing progressively worsening shortness of breath and chest pain over the past several days.  This is been associated with diaphoresis and evidence of hypoxia.  EMS was eventually called identify the patient  to be saturating in the 80s.  Patient was placed on supplemental oxygen in route and upon arrival to the emergency department has been requiring 6 L of oxygen via nasal cannula.  Upon evaluation in the emergency department, patient is found to have a fever of 100.9 F as well as a substantial right-sided pleural effusion identified on both chest x-ray and CT angiogram of the chest resulting in subtotal collapse of the left lower lobe and complete collapse of the right lower lobe with deviation of the esophagus to the left reviewing the radiologist to be concerned for a possible underlying paraesophageal mass.  Additionally, 2 tiny right apical filling defects concerning for subacute to chronic pulmonary emboli were identified.  Patient was initiated on intravenous meropenem for antibiotic coverage of suspected underlying pneumonia due to the patient's numerous allergies.  The hospitalist group was then called to assess the patient for admission to the hospital.  Review of Systems:   Review of Systems  Unable to perform ROS: Mental status change    Past Medical History:  Diagnosis Date  . Anemia   . Anxiety   . Asthma   . Breast cancer ([place]) 2014   right breast  . Cancer of right breast ([place]) 12/26/12   right breast 12:00 o'clock, DCIS  . Carotid artery disease ([place])   . Carpal tunnel syndrome, bilateral   . Chronic bronchitis ([place])   . Chronic cough   . Chronic facial pain   . Chronic foot pain   . Colon cancer ([place]) dx'd 11/2019  . Complication of anesthesia    Sore jaw; could not chew or move mouth, prolonged sedation  . Convulsions/seizures ([place]) 10/16/2014  . Diabetes mellitus  type 2 niddm x 20 years  . Dyslipidemia   . Ejection fraction   . Gait abnormality 12/04/2019  . GERD (gastroesophageal reflux disease)   . Hammer toe    bilateral  . History of colonic polyps   . History of meningioma   . HTN (hypertension)   . Hx of radiation therapy 03/07/13- 03/29/13   right breast  4250 cGy 17 sessions  . Hyperlipidemia   . Hypokalemia   . Hyponatremia   . Hypothyroidism   . IBS (irritable bowel syndrome)   . Melanoma ([place])   . Metatarsal bone fracture 2014  . Multiple drug allergies   . Nontoxic thyroid nodule   . Obesity   . Osteoarthritis   . Osteoporosis   . Palpitations   . Personal history of radiation therapy 2014  . Seasonal allergies   . Skin cancer   . Syncope   . Tremor, essential 08/18/2016  . Vitamin B12 deficiency   . Vitamin D deficiency     Past Surgical History:  Procedure Laterality Date  . ABDOMINAL HYSTERECTOMY    . BIOPSY  11/07/2019   Procedure: BIOPSY;  Surgeon: Ronald Lobo, MD;  Location: [facility];  Service: Endoscopy;;  . BRAIN SURGERY    . BREAST BIOPSY Right 01/24/2013   Procedure: RE-EXCICION OF BREAST CANCER, ANTERIOR MARGINS;  Surgeon: Edward Jolly, MD;  Location: [facility];  Service: General;  Laterality: Right;  . BREAST LUMPECTOMY Right 2014  . BREAST LUMPECTOMY WITH NEEDLE LOCALIZATION Right 01/17/2013   Procedure: BREAST LUMPECTOMY WITH NEEDLE LOCALIZATION;  Surgeon: Edward Jolly, MD;  Location: [facility];  Service: General;  Laterality: Right;  . BUNIONECTOMY Bilateral   . CATARACT EXTRACTION W/ INTRAOCULAR LENS IMPLANT Right   . CHOLECYSTECTOMY    . COLONOSCOPY    . COLONOSCOPY WITH PROPOFOL N/A 11/07/2019   Procedure: COLONOSCOPY WITH PROPOFOL;  Surgeon: Ronald Lobo, MD;  Location: [facility];  Service: Endoscopy;  Laterality: N/A;  . CRANIOTOMY Right 10/18/2013   Procedure: CRANIOTOMY TUMOR EXCISION;  Surgeon: Floyce Stakes, MD;  Location: [facility];  Service: Neurosurgery;  Laterality: Right;  . ESOPHAGOGASTRODUODENOSCOPY (EGD) WITH PROPOFOL N/A 11/07/2019   Procedure: ESOPHAGOGASTRODUODENOSCOPY (EGD) WITH PROPOFOL;  Surgeon: Ronald Lobo, MD;  Location: [facility];  Service: Endoscopy;  Laterality: N/A;  . EYE SURGERY    . HERNIA REPAIR    . KNEE ARTHROSCOPY Bilateral   . LAPAROSCOPIC  RIGHT HEMI COLECTOMY Right 03/06/2020   Procedure: LAPAROSCOPIC RIGHT HEMI COLECTOMY WITH TAP BLOCK AND LYSIS OF ADHESIONS;  Surgeon: Ileana Roup, MD;  Location: [facility];  Service: General;  Laterality: Right;  . POLYPECTOMY     small adenomatous  . ULNAR TUNNEL RELEASE       reports that she has never smoked. She has never used smokeless tobacco. She reports that she does not drink alcohol and does not use drugs.  Allergies  Allergen Reactions  . Bystolic [Nebivolol Hcl] Other (See Comments)    "extreme weakness, heaviness in legs & arms, swelling in legs/arms/face, swollen abdomen, pain in bladder, feet pain, soreness in chest"  . Cholestyramine     "itching rash on stomach, bloated, nausea, vomiting, sleeplessness, extreme pain in arms"  . Hydrazine Yellow [Tartrazine] Other (See Comments)    "does not reduce high blood pressure, pain in arm, high pressure, felt like I was on verge of heart attack, really weak"  . Morphine Other (See Comments)    Feels morbid, weak, still in pain  .  Niacin Palpitations    Fast heart beat  . Niaspan [Niacin Er] Palpitations and Other (See Comments)    "fast heart beat, high blood pressure"  . Norvasc [Amlodipine Besylate] Other (See Comments)    "extreme fluid retention/pain)  . Optivar [Azelastine Hcl] Photosensitivity  . Repaglinide Hives  . Sular [Nisoldipine Er] Other (See Comments)    "severe headaches, swelling eyes, hands, feet, shortness of breath, weak, flushed face, brain boiling, fluid retention, high blood sugar, nervous, heart fast beating"  . Tegretol [Carbamazepine] Other (See Comments)    Blood poisoning   . Telmisartan Other (See Comments)    "headache, difficulty urinating, high blood sugar, fluid retention"  . Amoxicillin-Pot Clavulanate Rash  . Cefdinir Swelling    Vaginal irritation, breathing,   . Ciprofloxacin Hcl Hives  . Clonidine Other (See Comments)    Dry mouth, fluid retention  . Clonidine Hydrochloride  Other (See Comments)    Dry mouth, fluid retention  . Codeine Nausea And Vomiting  . Ezetimibe Other (See Comments)    Made weak  . Hydroxychloroquine Other (See Comments)    Low platlets  . Naproxen Other (See Comments)    Shrinks bladder  . Sulfa Antibiotics Rash  . Ziac [Bisoprolol-Hydrochlorothiazide] Other (See Comments)    "stopped urination"  . Azelastine Other (See Comments)  . Elavil [Amitriptyline] Other (See Comments)    Gave Pt nightmares  . Empagliflozin Other (See Comments)    "Caused yeast infection, slowed my urine"  . Hydralazine   . Iodine   . Kenalog [Triamcinolone] Diarrhea  . Keppra [Levetiracetam] Other (See Comments)    Shaking  . Lamictal [Lamotrigine]     itching  . Lyrica [Pregabalin] Swelling  . Pseudoephedrine   . Topamax [Topiramate]     Dry eyes  . Ace Inhibitors Other (See Comments)    unknown  . Actonel [Risedronate Sodium] Other (See Comments)    unknown  . Amlodipine Besylate Other (See Comments)    unknown  . Aspirin Other (See Comments)    unknown  . Atacand [Candesartan] Other (See Comments)    unknown  . Bextra [Valdecoxib] Other (See Comments)    unknown  . Bisoprolol-Hydrochlorothiazide Other (See Comments)    unknown  . Candesartan Cilexetil Other (See Comments)    unknown  . Cefadroxil Other (See Comments)    unknown  . Celecoxib Rash  . Hydrocodone Other (See Comments)    unknown  . Hydrocodone-Acetaminophen Other (See Comments)    unknown  . Iodinated Diagnostic Agents Rash    "All over" "All over"  . Meloxicam Other (See Comments)    unknown  . Methylprednisolone Sodium Succinate Other (See Comments)    unknown  . Nabumetone Other (See Comments)    unknown  . Penicillins Other (See Comments)    unknown  . Pseudoephedrine-Guaifenesin Other (See Comments)    unknown  . Risedronate Sodium Other (See Comments)    unknown  . Rofecoxib Other (See Comments)    unknown  . Ru-Tuss [Chlorphen-Pse-Atrop-Hyos-Scop]  Other (See Comments)    unknown  . Sulfonamide Derivatives Other (See Comments)    unknown  . Sulphur [Sulfur] Other (See Comments)    unknown  . Telithromycin Other (See Comments)    unknown  . Terfenadine Other (See Comments)    unknown  . Trandolapril-Verapamil Hcl Er Other (See Comments)    Headache, difficulty urinating, high blood sugar, fluid retention  Pt is taking Tarka (trandolapril-verapamil) currently, but requests the medication stay in her  allergy list  . Trandolapril-Verapamil Hcl Er Other (See Comments)    Headache, difficulty urinating, high blood sugar, fluid retention  Pt is taking Tarka (trandolapril-verapamil) currently, but requests the medication stay in her allergy list  . Valium [Diazepam] Other (See Comments)    Makes her mean and hyper    Family History  Problem Relation Age of Onset  . Heart disease Mother   . Osteoporosis Mother   . Diabetes Father   . Pancreatic cancer Father   . Colon cancer Other   . Bone cancer Sister   . Prostate cancer Brother   . Colon cancer Brother   . Rectal cancer Sister   . Thyroid disease Sister        benign goiter resected     Prior to Admission medications   Medication Sig Start Date End Date Taking? Authorizing Provider  ALPRAZolam (XANAX) 0.25 MG tablet TAKE 1 TABLET AT BEDTIME AS NEEDED (CAN TAKE 1 TABLET DURING THE DAY IF NEEDED FOR ANXIETY) Patient taking differently: Take 0.25 mg by mouth at bedtime as needed for anxiety.  11/14/19   Ward Givens, NP  Biotin 1000 MCG tablet Take 1,000 mcg by mouth daily.     [provider]  Blood Glucose Monitoring Suppl (PRODIGY VOICE BLOOD GLUCOSE) w/Device KIT Use to check blood sugar 1 time per day. 10/18/15   Renato Shin, MD  Cholecalciferol (VITAMIN D-3) 125 MCG (5000 UT) TABS Take 5,000 Units by mouth in the morning and at bedtime.    [provider]  cholestyramine light (PREVALITE) 4 g packet Take 0.5 packets by mouth in the morning and at  bedtime. 11/29/19   [provider]  Cranberry-Vitamin C-Probiotic (AZO CRANBERRY PO) Take 1 tablet by mouth daily.    [provider]  cyanocobalamin (,VITAMIN B-12,) 1000 MCG/ML injection INJECT 1 ML INTRAMUSCULARLY EVERY 21 DAYS Patient taking differently: Inject 1,000 mcg into the muscle every 21 ( twenty-one) days.  06/18/16   Hoyt Koch, MD  diclofenac Sodium (VOLTAREN) 1 % GEL Apply 2 g topically 4 (four) times daily as needed (pain.).  06/01/19   [provider]  diphenhydrAMINE (BENADRYL) 25 MG tablet Take 25-50 mg by mouth every 6 (six) hours as needed (runny nose/allergies.).     [provider]  docusate sodium (COLACE) 100 MG capsule Take 200 mg by mouth 2 (two) times daily as needed (constipation.).     [provider]  escitalopram (LEXAPRO) 10 MG tablet Take 10 mg by mouth daily.    [provider]  glucose blood test strip Use to check blood sugar 1 time per day. 10/22/15   Renato Shin, MD  insulin aspart (NOVOLOG) 100 UNIT/ML FlexPen Inject 2-3 Units into the skin 3 (three) times daily as needed (blood sugars greater than 150=2 units & 200=3 units). Sliding Scale Insulin 06/02/19   [provider]  insulin glargine (LANTUS) 100 UNIT/ML injection Inject 14 Units into the skin at bedtime.     [provider]  Insulin Pen Needle 32G X 4 MM MISC Used to inject insulin 3x daily 11/19/16   Renato Shin, MD  levothyroxine (SYNTHROID) 75 MCG tablet Take 75 mcg by mouth daily before breakfast.    [provider]  lidocaine (LIDODERM) 5 % Place 1 patch onto the skin daily. Remove & Discard patch within 12 hours or as directed by MD Patient taking differently: Place 1-2 patches onto the skin daily as needed (pain). Remove & Discard patch within  12 hours or as directed by MD 01/25/15   Hoyt Koch, MD  ondansetron (ZOFRAN-ODT) 4 MG disintegrating tablet Take 1 tablet (4 mg total) by mouth every 6  (six) hours as needed for nausea. 03/19/20   Ileana Roup, MD  phenytoin (DILANTIN) 100 MG ER capsule Take 1 capsule (100 mg) in the morning and Take 2 capsules (200 mg) at bedtime Patient taking differently: Take 100-200 mg by mouth See admin instructions. Take 1 capsule (100 mg) in the morning & Take 2 capsules (200 mg) at bedtime 05/16/19   Ward Givens, NP  phenytoin (DILANTIN) 50 MG tablet Chew 50 mg by mouth daily. Total morning dose=150 mg    [provider]  Polyethyl Glycol-Propyl Glycol (SYSTANE OP) Place 1 drop into both eyes in the morning, at noon, in the evening, and at bedtime.     [provider]  primidone (MYSOLINE) 50 MG tablet Three tablets in the morning and one at dinner time Patient taking differently: Take 100-150 mg by mouth See admin instructions. Take 3 tablets (150 mg) by mouth in the morning & take 2 tablets (100 mg) by mouth in the afternoon. 12/04/19   Kathrynn Ducking, MD  silver sulfADIAZINE (SILVADENE) 1 % cream Apply 1 application topically daily as needed (itching.).    [provider]  Thiamine HCl (VITAMIN B-1) 250 MG tablet Take 250 mg by mouth at bedtime.    [provider]  trandolapril-verapamil (TARKA) 2-240 MG tablet Take 1 tablet by mouth at bedtime.     [provider]    Physical Exam: Vitals:   04/01/20 0333 04/01/20 0400 04/01/20 0430 04/01/20 0630  BP:  (!) 101/57 108/60 111/64  Pulse:  64 64 67  Resp:  '17 14 16  ' Temp: 97.8 F (36.6 C)     TempSrc: Oral     SpO2:  99% 98% 98%  Weight:      Height:        Constitutional: Lethargic, minimally arousable, not following commands, currently in mild respiratory distress. Skin: no rashes, no lesions, notably poor skin turgor.  Patient is diaphoretic. Eyes: Pupils are equally reactive to light.  No evidence of scleral icterus or conjunctival pallor.  ENMT: Dry mucous membranes noted.  Posterior pharynx clear of any exudate or lesions.   Neck:  normal, supple, no masses, no thyromegaly.  No evidence of jugular venous distension.   Respiratory: Markedly diminished breath sounds in the right middle and lower fields with associated rales throughout the right lung fields.  Evidence of wheezing.  Patient is exhibiting somewhat labored breathing but is exhibiting no evidence of accessory muscle use.  Cardiovascular: Regular rate and rhythm, no murmurs / rubs / gallops. No extremity edema. 2+ pedal pulses. No carotid bruits.  Chest:   Nontender without crepitus or deformity.   Back:   Nontender without crepitus or deformity. Abdomen: Notable generalized abdominal tenderness.  Abdomen soft however.  No evidence of intra-abdominal masses.  Positive bowel sounds noted in all quadrants.   Musculoskeletal: No joint deformity upper and lower extremities. Good ROM, no contractures. Normal muscle tone.  Neurologic: Patient is currently extremely lethargic and minimally arousable.  Patient is not following commands.  Patient is responsive to verbal and painful stimuli however.  Patient does localize to pain.  Psychiatric: Unable to assess due to substantial lethargy.  Patient currently does not seem to possess insight as to her current situation.  Labs on Admission: I have personally reviewed following  labs and imaging studies -   CBC: Recent Labs  Lab 03/31/20 0018  WBC 8.4  NEUTROABS 6.3  HGB 10.4*  HCT 32.1*  MCV 87.0  PLT 229   Basic Metabolic Panel: Recent Labs  Lab 03/31/20 0019  NA 127*  K 4.0  CL 93*  CO2 25  GLUCOSE 165*  BUN 7*  CREATININE 0.70  CALCIUM 7.6*   GFR: Estimated Creatinine Clearance: 47.3 mL/min (by C-G formula based on SCr of 0.7 mg/dL). Liver Function Tests: Recent Labs  Lab 03/31/20 0019  AST 20  ALT 9  ALKPHOS 74  BILITOT 0.7  PROT 5.1*  ALBUMIN 2.0*   No results for input(s): LIPASE, AMYLASE in the last 168 hours. No results for input(s): AMMONIA in the last 168 hours. Coagulation  Profile: Recent Labs  Lab 03/31/20 0019  INR 1.3*   Cardiac Enzymes: No results for input(s): CKTOTAL, CKMB, CKMBINDEX, TROPONINI in the last 168 hours. BNP (last 3 results) No results for input(s): PROBNP in the last 8760 hours. HbA1C: No results for input(s): HGBA1C in the last 72 hours. CBG: No results for input(s): GLUCAP in the last 168 hours. Lipid Profile: No results for input(s): CHOL, HDL, LDLCALC, TRIG, CHOLHDL, LDLDIRECT in the last 72 hours. Thyroid Function Tests: No results for input(s): TSH, T4TOTAL, FREET4, T3FREE, THYROIDAB in the last 72 hours. Anemia Panel: No results for input(s): VITAMINB12, FOLATE, FERRITIN, TIBC, IRON, RETICCTPCT in the last 72 hours. Urine analysis:    Component Value Date/Time   COLORURINE YELLOW 03/13/2020 1112   APPEARANCEUR CLEAR 03/13/2020 1112   LABSPEC 1.013 03/13/2020 1112   PHURINE 5.0 03/13/2020 1112   GLUCOSEU 50 (A) 03/13/2020 1112   GLUCOSEU NEGATIVE 12/28/2013 1036   HGBUR NEGATIVE 03/13/2020 1112   HGBUR trace-intact 06/29/2007 1304   BILIRUBINUR NEGATIVE 03/13/2020 1112   BILIRUBINUR negative 01/19/2017 1049   KETONESUR 5 (A) 03/13/2020 1112   PROTEINUR NEGATIVE 03/13/2020 1112   UROBILINOGEN 0.2 01/19/2017 1049   UROBILINOGEN 1.0 03/25/2015 1755   NITRITE NEGATIVE 03/13/2020 1112   LEUKOCYTESUR NEGATIVE 03/13/2020 1112    Radiological Exams on Admission - Personally Reviewed: CT Angio Chest PE W and/or Wo Contrast  Result Date: 04/01/2020 CLINICAL DATA:  Chest pain. History of DVT, right breast cancer status post radiation therapy, chronic cough, chronic bronchitis, asthma. EXAM: CT ANGIOGRAPHY CHEST WITH CONTRAST TECHNIQUE: Multidetector CT imaging of the chest was performed using the standard protocol during bolus administration of intravenous contrast. Multiplanar CT image reconstructions and MIPs were obtained to evaluate the vascular anatomy. CONTRAST:  17m OMNIPAQUE IOHEXOL 350 MG/ML SOLN COMPARISON:  None.  FINDINGS: Cardiovascular: There is excellent opacification of the pulmonary arterial tree. There is a single web-like intraluminal filling defect identified within the apical segmental pulmonary artery of the right upper lobe most in keeping with subacute to chronic pulmonary embolism. There is no other intraluminal filling defect identified and no evidence of acute pulmonary embolism. The central pulmonary arteries are of normal caliber. Mild coronary artery calcification. Global cardiac size within normal limits. No pericardial effusion. Mild atherosclerotic calcifications seen within the thoracic aorta. No aortic aneurysm. Mediastinum/Nodes: 2.2 cm left thyroid nodule is stable since prior examination of 02/17/2020 and is similar to that seen on prior thyroid sonogram of 08/30/2013. No pathologic thoracic adenopathy. The esophagus demonstrates significant leftward deviation and there is a possible paraesophageal mass within the retrocardiac region, best seen at the level of the left pulmonary artery at axial image # 56. Given its relatively rapid  development since prior examination, this most likely represents pleural fluid or paraesophageal adenopathy. Lungs/Pleura: Large right pleural effusion has developed with compressive atelectasis of the right lung. Small left pleural effusion is present with subtotal collapse of the left lower lobe. No pneumothorax. No central obstructing mass. Upper Abdomen: No acute abnormality. Musculoskeletal: Asymmetric degenerative changes at the right sternoclavicular junction are unchanged. Remote L1 vertebroplasty is partially visualized. No acute bone abnormality. Stable loss of height of T9 vertebral body. Review of the MIP images confirms the above findings. IMPRESSION: No acute pulmonary embolism. Tiny web-like filling defect within the right apical upper lobe segmental pulmonary artery most in keeping with subacute to chronic pulmonary embolus. Interval development of large  right and small left pleural effusions with associated subtotal collapse of the left lower lobe and compressive atelectasis of the right lung with complete collapse of the right lower lobe. Deviation of the esophagus with suggestion of a paraesophageal mass within the retrocardiac region. This would be better assessed with a standard contrast enhanced CT examination of the chest following resolution or evacuation of the patient's pleural effusion. Alternatively, this could be further assessed with PET CT examination. Stable 2.2 cm left thyroid nodule, unlikely of clinical significance. Aortic Atherosclerosis (ICD10-I70.0). Electronically Signed   By: Fidela Salisbury MD   On: 04/01/2020 05:19   DG Chest Port 1 View  Result Date: 03/31/2020 CLINICAL DATA:  Chest pain on the right EXAM: PORTABLE CHEST 1 VIEW COMPARISON:  03/12/2020 FINDINGS: Cardiac shadow is stable. Aortic calcifications are seen. The overall inspiratory effort is poor. Increasing density is noted over the right hemithorax consistent with a posteriorly layering effusion. Some patchy right basilar atelectasis is noted. Patchy left basilar atelectasis is seen as well. No bony abnormality is noted. IMPRESSION: Mild bibasilar atelectasis with increasing right-sided effusion. Electronically Signed   By: Inez Catalina M.D.   On: 03/31/2020 23:24    EKG: Personally reviewed.  Rhythm is normal sinus rhythm with heart rate of 80 bpm.  Notably low voltage.  No dynamic ST segment changes appreciated.  Assessment/Plan Principal Problem:   Acute respiratory failure with hypoxia [facility])   Patient notably hypoxic via EMS assessment and also here in the emergency department requiring submental oxygen via nasal cannula.  Patient is currently requiring between 3 and 4 L of oxygen via nasal cannula.  Etiology of the patient's acute hypoxic respiratory failure is likely the substantial right-sided pleural effusion noted on both chest x-ray and CT imaging  during this presentation  Considering patient's presentation with concurrent fever I am concerned about an underlying pneumonia.  Emergency department staff has already initiate the patient on intravenous meropenem due to patient's extensive allergy list, this will be continued for now.  Blood cultures have been obtained.  Gentle intravenous hydration is being provided.  Concerning substantial effusion, etiology is unclear at this time however this [place]z be parapneumonic or even malignant considering recent diagnosis of malignancy as well as history of multiple other malignancies as well including breast cancer.  Patient will need to undergo paracentesis for both therapeutic and diagnostic purposes.  Order for IR guided thoracentesis has been placed however patient is currently not able to sign her own consent.  Niece who is listed on the chart has power of attorney but I have been unable to contact her.  Active Problems:   Pleural effusion on right   Please see assessment and plan above    Pneumonia of right lung due to infectious organism  Please see assessment and plan above    Subacute pulmonary embolism ([place])   2 tiny apical pulmonary emboli noted in the right lung  These are unlikely to be the cause of the patient's hypoxia  Considering patient's complicated recent history of hemicolectomy with bleeding complications requiring transfusions, I question the necessity for anticoagulation for these tiny manifestations of thromboembolic disease  Will likely discuss the need for treatment with pulmonology before proceeding with any anticoagulation.  For now, avoiding anticoagulation until at least thoracentesis.    Acute metabolic encephalopathy   Patient currently quite lethargic and minimally arousable  However, per my review of older medical records, patient is typically awake alert and able to participate in conversation and make medical decisions  Encephalopathy felt to be  secondary to underlying infection and hypoxia  Keeping patient n.p.o. for now  Treating underlying infection, providing patient with submental oxygen, monitoring for symptomatic improvement  Patient does not rapidly clinically improve consider expanding encephalopathy work-up    Essential hypertension   Holding home regimen of oral antihypertensives  Patient is currently exhibiting low normal blood pressures  As needed intravenous antihypertensives for excessively elevated blood pressures    Seizure disorder ([place])   Patient has been transitioned to intravenous phenytoin while n.p.o.    Acquired hypothyroidism   Temporarily holding home regimen of oral Synthroid  If patient is to remain n.p.o. for an extended period of time will patient will be transitioned to intravenous levothyroxine    Hyponatremia   Notably chronic per review of previous notes.  Baseline sodium levels are typically in the upper 120s  We will monitor sodium levels closely as we provide patient with gentle intravenous hydration.    Uncontrolled type 2 diabetes mellitus with hyperglycemia, with long-term current use of insulin (HCC)   Holding home regimen of basal insulin  Accu-Cheks every 6 hours with sliding scale insulin while n.p.o.    Abnormal CT of the chest   Radiology mentions the possibility of a paraesophageal mass with deviation of the esophagus to the left  However, this is not entirely clear considering the size of the patient's effusion.  He recommends repeat CT imaging with contrast once the effusion has been remedied.    Adenocarcinoma of colon [facility])  Identified via colonoscopy in July 2021 of the ascending colon.  Status post right-sided hemicolectomy 11/3 by Dr. Dema Severin  Outpatient GI and oncology follow-up  Code Status:  Full code Family Communication: Attempted to contact the niece who has medical power of attorney and have been unsuccessful  Status is:  Observation  The patient remains OBS appropriate and will d/c before 2 midnights.  Dispo: The patient is from: SNF              Anticipated d/c is to: SNF              Anticipated d/c date is: 2 days              Patient currently is not medically stable to d/c.        Vernelle Emerald MD [facility] Pager [phone]  If 7PM-7AM, please contact night-coverage [email or web address] Use universal [place] password for that web site. If you do not have the password, please call the hospital operator.  04/01/2020, 7:01 AM

## 2020-04-01 NOTE — ED Notes (Signed)
Pt transitioned to [place] 6L. Maintaining O2 at 100%

## 2020-04-01 NOTE — ED Notes (Signed)
Dinner Tray Ordered @ 1740. 

## 2020-04-02 DIAGNOSIS — J9601 Acute respiratory failure with hypoxia: Secondary | ICD-10-CM | POA: Diagnosis not present

## 2020-04-02 LAB — COMPREHENSIVE METABOLIC PANEL
ALT: 14 U/L (ref 0–44)
AST: 21 U/L (ref 15–41)
Albumin: 1.9 g/dL — ABNORMAL LOW (ref 3.5–5.0)
Alkaline Phosphatase: 72 U/L (ref 38–126)
Anion gap: 7 (ref 5–15)
BUN: 8 mg/dL (ref 8–23)
CO2: 23 mmol/L (ref 22–32)
Calcium: 7.7 mg/dL — ABNORMAL LOW (ref 8.9–10.3)
Chloride: 94 mmol/L — ABNORMAL LOW (ref 98–111)
Creatinine, Ser: 0.68 mg/dL (ref 0.44–1.00)
GFR, Estimated: >60 mL/min (ref >60)
Glucose, Bld: 196 mg/dL — ABNORMAL HIGH (ref 70–99)
Potassium: 3.6 mmol/L (ref 3.5–5.1)
Sodium: 124 mmol/L — ABNORMAL LOW (ref 135–145)
Total Bilirubin: 0.3 mg/dL (ref 0.3–1.2)
Total Protein: 4.9 g/dL — ABNORMAL LOW (ref 6.5–8.1)

## 2020-04-02 LAB — CBC WITH DIFFERENTIAL/PLATELET
Abs Immature Granulocytes: 0.03 10*3/uL (ref 0.00–0.07)
Basophils Absolute: 0 10*3/uL (ref 0.0–0.1)
Basophils Relative: 1 %
Eosinophils Absolute: 0.2 10*3/uL (ref 0.0–0.5)
Eosinophils Relative: 3 %
HCT: 32.2 % — ABNORMAL LOW (ref 36.0–46.0)
Hemoglobin: 10.6 g/dL — ABNORMAL LOW (ref 12.0–15.0)
Immature Granulocytes: 1 %
Lymphocytes Relative: 14 %
Lymphs Abs: 0.8 10*3/uL (ref 0.7–4.0)
MCH: 27.9 pg (ref 26.0–34.0)
MCHC: 32.9 g/dL (ref 30.0–36.0)
MCV: 84.7 fL (ref 80.0–100.0)
Monocytes Absolute: 0.5 10*3/uL (ref 0.1–1.0)
Monocytes Relative: 9 %
Neutro Abs: 4.1 10*3/uL (ref 1.7–7.7)
Neutrophils Relative %: 72 %
Platelets: 435 10*3/uL — ABNORMAL HIGH (ref 150–400)
RBC: 3.8 MIL/uL — ABNORMAL LOW (ref 3.87–5.11)
RDW: 21 % — ABNORMAL HIGH (ref 11.5–15.5)
WBC: 5.7 10*3/uL (ref 4.0–10.5)
nRBC: 0 % (ref 0.0–0.2)

## 2020-04-02 LAB — MAGNESIUM: Magnesium: 1.3 mg/dL — ABNORMAL LOW (ref 1.7–2.4)

## 2020-04-02 LAB — GLUCOSE, CAPILLARY: Glucose-Capillary: 187 mg/dL — ABNORMAL HIGH (ref 70–99)

## 2020-04-02 LAB — URINE CULTURE: Culture: NO GROWTH

## 2020-04-02 LAB — PH, BODY FLUID: pH, Body Fluid: 7.3

## 2020-04-02 MED ORDER — CLINDAMYCIN HCL 300 MG PO CAPS: 300.0000 mg | ORAL_CAPSULE | Freq: Three times a day (TID) | ORAL | 0 refills | Status: AC

## 2020-04-02 MED ORDER — ALPRAZOLAM 0.25 MG PO TABS
ORAL_TABLET | ORAL | 0 refills | Status: DC
Start: 2020-04-02 — End: 2020-06-10

## 2020-04-02 NOTE — Care Management Obs Status (Signed)
[place] NOTIFICATION   Patient Details  Name: [name] MRN: [id number] Date of Birth: [birth year]   Medicare Observation Status Notification Given:  Yes    Bartholomew Crews, RN 04/02/2020, 10:48 AM

## 2020-04-02 NOTE — TOC Transition Note (Signed)
Transition of Care [facility]) - CM/SW Discharge Note   Patient Details  Name: [name] MRN: [id number] Date of Birth: [birth year]  Transition of [facility]) CM/SW Contact:  Bartholomew Crews, RN Phone Number: [phone] 04/02/2020, 12:32 PM   Clinical Narrative:     Notified by MD that patient ready to return to SNF. Notified Chief Financial Officer at [facility]. Patient is observation at hospital. [facility] can accept back today. MD notified of patient able to return - SNF needs DC summary. Patient notified, and agreeable to return. Patient stated ok to call sister to advise. PTAR arranged. No further TOC needs identified.   Final next level of care: Skilled Nursing Facility Barriers to Discharge: No Barriers Identified   Patient Goals and CMS Choice Patient states their goals for this hospitalization and ongoing recovery are:: back to SNF to continue rehab CMS Medicare.gov Compare Post Acute Care list provided to:: Patient Choice offered to / list presented to : Patient  Discharge Placement              Patient chooses bed at: [facility] Patient to be transferred to facility by: [place] Name of family member notified: Tonia [place], sister Patient and family notified of of transfer: 04/02/20  Discharge Plan and Services                DME Arranged: N/A DME Agency: NA       HH Arranged: NA [place] Agency: NA        Social Determinants of Health ([place]) Interventions     Readmission Risk Interventions Readmission Risk Prevention Plan 03/15/2020 03/13/2020  Transportation Screening Complete -  PCP or Specialist Appt within 3-5 Days Complete Complete  HRI or [place] - Complete  Social Work Consult for [place] Planning/Counseling Complete Complete  Palliative Care Screening Complete Complete  Medication Review [employer] photographer) Complete Complete  Some recent data might be hidden

## 2020-04-02 NOTE — Discharge Summary (Signed)
Physician Discharge Summary  [name] SWH:[id number] DOB: Dec 20, 1929 DOA: 03/31/2020  PCP: Bartholome Bill, MD  Admit date: 03/31/2020 Discharge date: 04/02/2020  Admitted From: SNF  Disposition:  SNF   Recommendations for Outpatient Follow-up:  1. Follow up with PCP in 1-2 weeks after discharge from SNF Please obtain CBC and BMP in 1 week Please obtain CXR in 10 days 2. Continue clindamycin for 10 days 3. Please follow up pleural fluid cytology testing 4. Please obtain CT chest with contrast as an outpatient to evaluate paraesophageal mass (see CT report below)      Home Health: N/A  Equipment/Devices: TBD at SNF  Discharge Condition: Fair  CODE STATUS: FULL Diet recommendation: Diabetic  Brief/Interim Summary: [name] is a 86 y.o. F with hx recent dx adenoCA colon s/p resection complicated by post-ob bleeding, then readmission due to hemoperitoneum and ileus, as well as DM, HTN, SDH in 2015, chronic hyponatremia, hypothyroidism, remote BrCA, hx brain mass s/p resection and seizures who presented with several days progressive SOB and chest pain.  SpO2 <90% with EMS.  In the ER, temp 100.87F, CTA chest showed large pleural effusion, subtotal collapse of the left lower lobe and complete collapse of the right lower lobe, paraesophageal mass, resolving right upper lobe segmental PE, and pneumonia.     PRINCIPAL HOSPITAL DIAGNOSIS: Acute respiratory failure with hypoxia due to right-sided pleural effusion and pneumonia of the right lung    Discharge Diagnoses:  Acute respiratory failure with hypoxia due to right-sided pleural effusion and pneumonia of the right lung Patient admitted with dyspnea, SpO2 <90%.  Thoracentesis performed.  No cells, but LDH >300, exudative, associated with pneumonia.  Patient treated with meropenem due to allergies, now mentating at baseline, taking orals.  Temp < 100 F, heart rate < 100bpm, RR < 24, SpO2 at baseline.   Stable for  discharge.   Continue clindamycin (again due to allergies to penicillins, cephalosporins, fluoroquinolones) for 10 days.       Acute metabolic encephalopathy Resolved  Subacute pulmonary embolism Continue Lovenox  Paraesophageal mass Follow-up CT chest with contrast as an outpatient  Resolving blood loss anemia due to adenocarcinoma of the colon resection Recent diagnosis colon cancer  Diabetes  Hypertension  Hyponatremia Stable relative to baseline  Hypothyroidism  Seizure disorder Continue Dilantin            Discharge Instructions  Discharge Instructions    Diet - low sodium heart healthy   Complete by: As directed    Increase activity slowly   Complete by: As directed    No wound care   Complete by: As directed      Allergies as of 04/02/2020      Reactions   Bystolic [nebivolol Hcl] Other (See Comments)   "extreme weakness, heaviness in legs & arms, swelling in legs/arms/face, swollen abdomen, pain in bladder, feet pain, soreness in chest"   Cholestyramine    "itching rash on stomach, bloated, nausea, vomiting, sleeplessness, extreme pain in arms"   Hydrazine Yellow [tartrazine] Other (See Comments)   "does not reduce high blood pressure, pain in arm, high pressure, felt like I was on verge of heart attack, really weak"   Morphine Other (See Comments)   Feels morbid, weak, still in pain   Niacin Palpitations   Fast heart beat   Niaspan [niacin Er] Palpitations, Other (See Comments)   "fast heart beat, high blood pressure"   Norvasc [amlodipine Besylate] Other (See Comments)   "extreme fluid retention/pain)  Optivar [azelastine Hcl] Photosensitivity   Repaglinide Hives   Sular [nisoldipine Er] Other (See Comments)   "severe headaches, swelling eyes, hands, feet, shortness of breath, weak, flushed face, brain boiling, fluid retention, high blood sugar, nervous, heart fast beating"   Tegretol [carbamazepine] Other (See Comments)    Blood poisoning   Telmisartan Other (See Comments)   "headache, difficulty urinating, high blood sugar, fluid retention"   Amoxicillin-pot Clavulanate Rash   Cefdinir Swelling   Vaginal irritation, breathing,    Ciprofloxacin Hcl Hives   Clonidine Other (See Comments)   Dry mouth, fluid retention   Clonidine Hydrochloride Other (See Comments)   Dry mouth, fluid retention   Codeine Nausea And Vomiting   Ezetimibe Other (See Comments)   Made weak   Hydroxychloroquine Other (See Comments)   Low platlets   Naproxen Other (See Comments)   Shrinks bladder   Sulfa Antibiotics Rash   Ziac [bisoprolol-hydrochlorothiazide] Other (See Comments)   "stopped urination"   Azelastine Other (See Comments)   Unknown reaction - Per MAR   Elavil [amitriptyline] Other (See Comments)   Gave Pt nightmares   Empagliflozin Other (See Comments)   "Caused yeast infection, slowed my urine"   Hydralazine Other (See Comments)   Unknown reaction - Per MAR   Iodine I 131 Tositumomab Other (See Comments)   Unknown reaction - Per MAR   Kenalog [triamcinolone] Diarrhea   Per MAR   Keppra [levetiracetam] Other (See Comments)   Shaking   Lamictal [lamotrigine] Itching   Lyrica [pregabalin] Swelling   Norvasc [amlodipine] Other (See Comments)   Unknown reaction - Per MAR   Pseudoephedrine    Pseudoephedrine-guaifenesin Er Other (See Comments)   Unknown reaction - Per MAR   Risedronate    Unknown reaction - Per MAR   Ru-hist D [brompheniramine-phenylephrine] Other (See Comments)   Unknown reaction - Per MAR   Topamax [topiramate] Other (See Comments)   Dry eyes   Ace Inhibitors Other (See Comments)   unknown   Actonel [risedronate Sodium] Other (See Comments)   unknown   Amlodipine Besylate Other (See Comments)   unknown   Aspirin Other (See Comments)   unknown   Atacand [candesartan] Other (See Comments)   Unknown reaction - MAR   Bextra [valdecoxib] Other (See Comments)   Unknown reaction - Per  MAR   Bisoprolol-hydrochlorothiazide Other (See Comments)   Unknown reaction - Per MAR   Candesartan Cilexetil Other (See Comments)   unknown   Cefadroxil Other (See Comments)   unknown   Celecoxib Rash   Hydrocodone Other (See Comments)   unknown   Hydrocodone-acetaminophen Other (See Comments)   unknown   Iodinated Diagnostic Agents Rash   "All over"   Meloxicam Other (See Comments)   unknown   Methylprednisolone Sodium Succinate Other (See Comments)   unknown   Nabumetone Other (See Comments)   Unknown reaction   Penicillins Other (See Comments)   unknown   Pseudoephedrine-guaifenesin Other (See Comments)   unknown   Risedronate Sodium Other (See Comments)   unknown   Rofecoxib Other (See Comments)   Unknown reaction - MAR   Ru-tuss [chlorphen-pse-atrop-hyos-scop] Other (See Comments)   unknown   Sulfonamide Derivatives Other (See Comments)   unknown   Sulphur [sulfur] Other (See Comments)   unknown   Telithromycin Other (See Comments)   unknown   Terfenadine Other (See Comments)   Unknown reaction - Per MAR   Trandolapril-verapamil Hcl Er Other (See Comments)   Headache, difficulty urinating, high  blood sugar, fluid retention Pt is taking Tarka (trandolapril-verapamil) currently, but requests the medication stay in her allergy list   Trandolapril-verapamil Hcl Er Other (See Comments)   Headache, difficulty urinating, high blood sugar, fluid retention Pt is taking Tarka (trandolapril-verapamil) currently, but requests the medication stay in her allergy list   Valium [diazepam] Other (See Comments)   Makes her mean and hyper      Medication List    TAKE these medications   ALPRAZolam 0.25 MG tablet Commonly known as: XANAX TAKE 1 TABLET AT BEDTIME AS NEEDED (CAN TAKE 1 TABLET DURING THE DAY IF NEEDED FOR ANXIETY) What changed:   how much to take  how to take this  when to take this  additional instructions   Biotin 1000 MCG tablet Take 1,000 mcg by  mouth daily.   cholestyramine light 4 g packet Commonly known as: PREVALITE Take 2 g by mouth in the morning and at bedtime.   clindamycin 300 MG capsule Commonly known as: CLEOCIN Take 1 capsule (300 mg total) by mouth 3 (three) times daily for 10 days.   Cranberry 425 MG Caps Take 425 mg by mouth daily.   cyanocobalamin 1000 MCG/ML injection Commonly known as: (VITAMIN B-12) INJECT 1 ML INTRAMUSCULARLY EVERY 21 DAYS What changed: See the new instructions.   diclofenac Sodium 1 % Gel Commonly known as: VOLTAREN Apply 2 g topically 4 (four) times daily.   docusate sodium 100 MG capsule Commonly known as: COLACE Take 200 mg by mouth 2 (two) times daily as needed (for constipation.).   enoxaparin 60 MG/0.6ML injection Commonly known as: LOVENOX Inject 60 mg into the skin every 12 (twelve) hours.   escitalopram 10 MG tablet Commonly known as: LEXAPRO Take 10 mg by mouth daily.   feeding supplement (GLUCERNA SHAKE) Liqd Take 237 mLs by mouth 2 (two) times daily.   fluconazole 150 MG tablet Commonly known as: DIFLUCAN Take 150 mg by mouth daily.   glucose blood test strip Use to check blood sugar 1 time per day.   insulin aspart 100 UNIT/ML FlexPen Commonly known as: NOVOLOG Inject 2-10 Units into the skin 3 (three) times daily with meals. Injects 2-10 units of insulin under the skin 3 times a day per sliding scale: CBG 0-150: 2 units; CBG 151-200: 4 units; CBG 201-250: 6 units; CBG 251-300: 8 units; CBG 301-350: 10 units; CBG 351-400: 12 units; CBG >400: 12 units and notify the MD/NP   insulin glargine 100 UNIT/ML injection Commonly known as: LANTUS Inject 14 Units into the skin at bedtime.   Insulin Pen Needle 32G X 4 MM Misc Used to inject insulin 3x daily   levothyroxine 75 MCG tablet Commonly known as: SYNTHROID Take 75 mcg by mouth daily before breakfast.   lidocaine 5 % Commonly known as: LIDODERM Place 1 patch onto the skin daily. Remove & Discard patch  within 12 hours or as directed by MD   ondansetron 4 MG disintegrating tablet Commonly known as: ZOFRAN-ODT Take 1 tablet (4 mg total) by mouth every 6 (six) hours as needed for nausea.   oxycodone 5 MG capsule Commonly known as: OXY-IR Take 5 mg by mouth in the morning, at noon, and at bedtime.   phenytoin 100 MG ER capsule Commonly known as: DILANTIN Take 1 capsule (100 mg) in the morning and Take 2 capsules (200 mg) at bedtime What changed:   how much to take  how to take this  when to take this  additional instructions  primidone 50 MG tablet Commonly known as: MYSOLINE Three tablets in the morning and one at dinner time What changed:   how much to take  how to take this  when to take this  additional instructions   Prodigy Voice Blood Glucose w/Device Kit Use to check blood sugar 1 time per day.   SYSTANE OP Place 1 drop into both eyes in the morning, at noon, in the evening, and at bedtime.   trandolapril-verapamil 2-240 MG tablet Commonly known as: TARKA Take 1 tablet by mouth at bedtime.   vitamin B-1 250 MG tablet Take 250 mg by mouth at bedtime.   Vitamin D-3 125 MCG (5000 UT) Tabs Take 5,000 Units by mouth in the morning and at bedtime.       Follow-up Information    Bartholome Bill, MD. Schedule an appointment as soon as possible for a visit in 1 week(s).   Specialty: Family Medicine Why: within 1 week of SNF discharge Contact information: [place] [address] [phone]              Allergies  Allergen Reactions  . Bystolic [Nebivolol Hcl] Other (See Comments)    "extreme weakness, heaviness in legs & arms, swelling in legs/arms/face, swollen abdomen, pain in bladder, feet pain, soreness in chest"  . Cholestyramine     "itching rash on stomach, bloated, nausea, vomiting, sleeplessness, extreme pain in arms"  . Hydrazine Yellow [Tartrazine] Other (See Comments)    "does not reduce high  blood pressure, pain in arm, high pressure, felt like I was on verge of heart attack, really weak"  . Morphine Other (See Comments)    Feels morbid, weak, still in pain  . Niacin Palpitations    Fast heart beat  . Niaspan [Niacin Er] Palpitations and Other (See Comments)    "fast heart beat, high blood pressure"  . Norvasc [Amlodipine Besylate] Other (See Comments)    "extreme fluid retention/pain)  . Optivar [Azelastine Hcl] Photosensitivity  . Repaglinide Hives  . Sular [Nisoldipine Er] Other (See Comments)    "severe headaches, swelling eyes, hands, feet, shortness of breath, weak, flushed face, brain boiling, fluid retention, high blood sugar, nervous, heart fast beating"  . Tegretol [Carbamazepine] Other (See Comments)    Blood poisoning   . Telmisartan Other (See Comments)    "headache, difficulty urinating, high blood sugar, fluid retention"  . Amoxicillin-Pot Clavulanate Rash  . Cefdinir Swelling    Vaginal irritation, breathing,   . Ciprofloxacin Hcl Hives  . Clonidine Other (See Comments)    Dry mouth, fluid retention  . Clonidine Hydrochloride Other (See Comments)    Dry mouth, fluid retention  . Codeine Nausea And Vomiting  . Ezetimibe Other (See Comments)    Made weak  . Hydroxychloroquine Other (See Comments)    Low platlets  . Naproxen Other (See Comments)    Shrinks bladder  . Sulfa Antibiotics Rash  . Ziac [Bisoprolol-Hydrochlorothiazide] Other (See Comments)    "stopped urination"  . Azelastine Other (See Comments)    Unknown reaction - Per MAR  . Elavil [Amitriptyline] Other (See Comments)    Gave Pt nightmares  . Empagliflozin Other (See Comments)    "Caused yeast infection, slowed my urine"  . Hydralazine Other (See Comments)    Unknown reaction - Per MAR  . Iodine I 131 Tositumomab Other (See Comments)    Unknown reaction - Per MAR  . Kenalog [Triamcinolone] Diarrhea    Per  MAR  . Keppra [Levetiracetam] Other (See Comments)    Shaking  .  Lamictal [Lamotrigine] Itching  . Lyrica [Pregabalin] Swelling  . Norvasc [Amlodipine] Other (See Comments)    Unknown reaction - Per MAR  . Pseudoephedrine   . Pseudoephedrine-Guaifenesin Er Other (See Comments)    Unknown reaction - Per MAR  . Risedronate     Unknown reaction - Per MAR  . Ru-Hist D [Brompheniramine-Phenylephrine] Other (See Comments)    Unknown reaction - Per MAR  . Topamax [Topiramate] Other (See Comments)    Dry eyes  . Ace Inhibitors Other (See Comments)    unknown  . Actonel [Risedronate Sodium] Other (See Comments)    unknown  . Amlodipine Besylate Other (See Comments)    unknown  . Aspirin Other (See Comments)    unknown  . Atacand [Candesartan] Other (See Comments)    Unknown reaction - MAR  . Bextra [Valdecoxib] Other (See Comments)    Unknown reaction - Per MAR  . Bisoprolol-Hydrochlorothiazide Other (See Comments)    Unknown reaction - Per MAR  . Candesartan Cilexetil Other (See Comments)    unknown  . Cefadroxil Other (See Comments)    unknown  . Celecoxib Rash  . Hydrocodone Other (See Comments)    unknown  . Hydrocodone-Acetaminophen Other (See Comments)    unknown  . Iodinated Diagnostic Agents Rash    "All over"   . Meloxicam Other (See Comments)    unknown  . Methylprednisolone Sodium Succinate Other (See Comments)    unknown  . Nabumetone Other (See Comments)    Unknown reaction  . Penicillins Other (See Comments)    unknown  . Pseudoephedrine-Guaifenesin Other (See Comments)    unknown  . Risedronate Sodium Other (See Comments)    unknown  . Rofecoxib Other (See Comments)    Unknown reaction - MAR  . Ru-Tuss [Chlorphen-Pse-Atrop-Hyos-Scop] Other (See Comments)    unknown  . Sulfonamide Derivatives Other (See Comments)    unknown  . Sulphur [Sulfur] Other (See Comments)    unknown  . Telithromycin Other (See Comments)    unknown  . Terfenadine Other (See Comments)    Unknown reaction - Per MAR  . Trandolapril-Verapamil  Hcl Er Other (See Comments)    Headache, difficulty urinating, high blood sugar, fluid retention  Pt is taking Tarka (trandolapril-verapamil) currently, but requests the medication stay in her allergy list  . Trandolapril-Verapamil Hcl Er Other (See Comments)    Headache, difficulty urinating, high blood sugar, fluid retention  Pt is taking Tarka (trandolapril-verapamil) currently, but requests the medication stay in her allergy list  . Valium [Diazepam] Other (See Comments)    Makes her mean and hyper    Consultations:  None   Procedures/Studies: CT ABDOMEN PELVIS WO CONTRAST  Result Date: 03/13/2020 CLINICAL DATA:  Acute abdominal pain, recent findings of hemorrhage on CT examination EXAM: CT ABDOMEN AND PELVIS WITHOUT CONTRAST TECHNIQUE: Multidetector CT imaging of the abdomen and pelvis was performed following the standard protocol without IV contrast. COMPARISON:  03/12/2020 FINDINGS: Lower chest: Lung bases demonstrate mild bibasilar atelectasis. Cardiac blood pool shows decreased attenuation consistent with underlying anemia. Hepatobiliary: Gallbladder has been surgically removed. Liver is within normal limits. Pancreas: Stable cystic lesion is noted body/tail of the pancreas similar to that seen on the previous exam. A few scattered calcifications are noted likely related to chronic pancreatitis. Spleen: Normal in size without focal abnormality. Adrenals/Urinary Tract: Kidneys are well visualize without renal calculi or obstructive changes. The bladder  is decompressed by Foley catheter. Stomach/Bowel: There are changes consistent with the recent right hemicolectomy in the right mid abdomen. The anastomosis appears patent. No areas of focal fluid collection or air collection are noted adjacent to the anastomotic site to suggest perforation or breakdown. Contrast material has not reached the level of the anastomosis however. The more distal colon is within normal limits. Small bowel  demonstrates some mild dilatation increased from the previous day likely related to ileus. Vascular/Lymphatic: Aortic atherosclerosis. No enlarged abdominal or pelvic lymph nodes. Reproductive: Status post hysterectomy. No adnexal masses. Other: No herniation is identified. Postsurgical changes are noted in the anterior abdominal wall consistent with the given clinical history. Free fluid is noted slightly increased when compared with the prior exam and the area of hyperdense material in the right lower quadrant also appears mildly increased consistent with local hemorrhage. This is consistent with the patient's recent drop in hemoglobin. Musculoskeletal: No acute or significant osseous findings. Changes of prior vertebral augmentation are noted at T12. Chronic T8 compression deformity is noted as well. IMPRESSION: Persistent and slightly increased hyperdense material in the right lower quadrant consistent with the known hemorrhage. This corresponds with the patient's hemoglobin drop and corresponds with the overall anemia in the cardiac blood pool. Changes consistent with right hemicolectomy without evidence of perforation or focal pericolonic fluid collection. Mild dilatation of the small bowel related to ileus as well as some localized compression of the more distal small bowel by the focal right lower quadrant hematoma. These results were called by telephone at the time of interpretation on 03/13/2020 to provider Lilly Gasser WHITE , who verbally acknowledged these results. Electronically Signed   By: Inez Catalina M.D.   On: 03/13/2020 13:07   CT ABDOMEN PELVIS WO CONTRAST  Result Date: 03/12/2020 CLINICAL DATA:  Acute generalized abdominal pain. EXAM: CT ABDOMEN AND PELVIS WITHOUT CONTRAST TECHNIQUE: Multidetector CT imaging of the abdomen and pelvis was performed following the standard protocol without IV contrast. COMPARISON:  November 22, 2019. FINDINGS: Lower chest: No acute abnormality. Hepatobiliary: No  focal liver abnormality is seen. Status post cholecystectomy. No biliary dilatation. Pancreas: Grossly stable 15 mm cystic abnormality is seen in pancreatic tail. No acute inflammation is noted. No ductal dilatation is noted. Spleen: Fluid is noted around the spleen. Adrenals/Urinary Tract: Adrenal glands are unremarkable. Kidneys are normal, without renal calculi, focal lesion, or hydronephrosis. Bladder is unremarkable. Stomach/Bowel: The stomach appears normal. Status post right hemicolectomy. Mildly dilated small bowel loops are noted proximally which most likely represent ileus. There does appear to be a moderate amount of high density material in the right lower quadrant and posterior pelvis consistent with hemorrhage. Vascular/Lymphatic: Aortic atherosclerosis. No enlarged abdominal or pelvic lymph nodes. Reproductive: Status post hysterectomy. No adnexal masses. Other: No hernia is noted. Musculoskeletal: No acute or significant osseous findings. IMPRESSION: 1. Moderate amount of high density material is noted in the right lower quadrant and posterior pelvis consistent with hemorrhage. Critical Value/emergent results were called by telephone at the time of interpretation on 03/12/2020 at 8:12 pm to provider [facility] PATEL , who verbally acknowledged these results. 2. Mildly dilated small bowel loops are noted proximally which most likely represent ileus. 3. Grossly stable 15 mm cystic abnormality is seen in pancreatic tail. Follow-up with MRI in 1 year is recommended to ensure stability. 4. Aortic atherosclerosis. Aortic Atherosclerosis (ICD10-I70.0). Electronically Signed   By: Marijo Conception M.D.   On: 03/12/2020 20:12   DG Chest 1 View  Result  Date: 04/01/2020 CLINICAL DATA:  Post thoracentesis EXAM: CHEST  1 VIEW COMPARISON:  03/31/2020 FINDINGS: Decreased right pleural effusion and improved aeration of the right lung post thoracentesis. No pneumothorax low lung volumes with bibasilar atelectasis.  Similar cardiomediastinal contours. IMPRESSION: Improved aeration of the right lung post thoracentesis. No pneumothorax. Bibasilar atelectasis. Electronically Signed   By: Macy Mis M.D.   On: 04/01/2020 11:08   CT Angio Chest PE W and/or Wo Contrast  Result Date: 04/01/2020 CLINICAL DATA:  Chest pain. History of DVT, right breast cancer status post radiation therapy, chronic cough, chronic bronchitis, asthma. EXAM: CT ANGIOGRAPHY CHEST WITH CONTRAST TECHNIQUE: Multidetector CT imaging of the chest was performed using the standard protocol during bolus administration of intravenous contrast. Multiplanar CT image reconstructions and MIPs were obtained to evaluate the vascular anatomy. CONTRAST:  64m OMNIPAQUE IOHEXOL 350 MG/ML SOLN COMPARISON:  None. FINDINGS: Cardiovascular: There is excellent opacification of the pulmonary arterial tree. There is a single web-like intraluminal filling defect identified within the apical segmental pulmonary artery of the right upper lobe most in keeping with subacute to chronic pulmonary embolism. There is no other intraluminal filling defect identified and no evidence of acute pulmonary embolism. The central pulmonary arteries are of normal caliber. Mild coronary artery calcification. Global cardiac size within normal limits. No pericardial effusion. Mild atherosclerotic calcifications seen within the thoracic aorta. No aortic aneurysm. Mediastinum/Nodes: 2.2 cm left thyroid nodule is stable since prior examination of 02/17/2020 and is similar to that seen on prior thyroid sonogram of 08/30/2013. No pathologic thoracic adenopathy. The esophagus demonstrates significant leftward deviation and there is a possible paraesophageal mass within the retrocardiac region, best seen at the level of the left pulmonary artery at axial image # 56. Given its relatively rapid development since prior examination, this most likely represents pleural fluid or paraesophageal adenopathy.  Lungs/Pleura: Large right pleural effusion has developed with compressive atelectasis of the right lung. Small left pleural effusion is present with subtotal collapse of the left lower lobe. No pneumothorax. No central obstructing mass. Upper Abdomen: No acute abnormality. Musculoskeletal: Asymmetric degenerative changes at the right sternoclavicular junction are unchanged. Remote L1 vertebroplasty is partially visualized. No acute bone abnormality. Stable loss of height of T9 vertebral body. Review of the MIP images confirms the above findings. IMPRESSION: No acute pulmonary embolism. Tiny web-like filling defect within the right apical upper lobe segmental pulmonary artery most in keeping with subacute to chronic pulmonary embolus. Interval development of large right and small left pleural effusions with associated subtotal collapse of the left lower lobe and compressive atelectasis of the right lung with complete collapse of the right lower lobe. Deviation of the esophagus with suggestion of a paraesophageal mass within the retrocardiac region. This would be better assessed with a standard contrast enhanced CT examination of the chest following resolution or evacuation of the patient's pleural effusion. Alternatively, this could be further assessed with PET CT examination. Stable 2.2 cm left thyroid nodule, unlikely of clinical significance. Aortic Atherosclerosis (ICD10-I70.0). Electronically Signed   By: AFidela SalisburyMD   On: 04/01/2020 05:19   DG Chest Port 1 View  Result Date: 03/31/2020 CLINICAL DATA:  Chest pain on the right EXAM: PORTABLE CHEST 1 VIEW COMPARISON:  03/12/2020 FINDINGS: Cardiac shadow is stable. Aortic calcifications are seen. The overall inspiratory effort is poor. Increasing density is noted over the right hemithorax consistent with a posteriorly layering effusion. Some patchy right basilar atelectasis is noted. Patchy left basilar atelectasis is seen as  well. No bony abnormality is  noted. IMPRESSION: Mild bibasilar atelectasis with increasing right-sided effusion. Electronically Signed   By: Inez Catalina M.D.   On: 03/31/2020 23:24   DG Chest Port 1 View  Result Date: 03/12/2020 CLINICAL DATA:  Questionable sepsis, evaluate for abnormality, surgery 03/06/2020 for colon cancer EXAM: PORTABLE CHEST 1 VIEW COMPARISON:  CT 02/17/2020, radiograph 02/17/2020 FINDINGS: No consolidation, features of edema, pneumothorax, or effusion. Pulmonary vascularity is normally distributed. The aorta is calcified. The remaining cardiomediastinal contours are unremarkable. No acute osseous or soft tissue abnormality. Telemetry leads overlie the chest. IMPRESSION: No acute cardiopulmonary abnormality. Specifically, no focal consolidative opacity to suggest pneumonia. Electronically Signed   By: Lovena Le M.D.   On: 03/12/2020 19:59   IR THORACENTESIS ASP PLEURAL SPACE W/IMG GUIDE  Result Date: 04/01/2020 INDICATION: Shortness of breath. Large right pleural effusion. Request for diagnostic and therapeutic thoracentesis. EXAM: ULTRASOUND GUIDED RIGHT THORACENTESIS MEDICATIONS: 1% plain lidocaine, 5 mL COMPLICATIONS: None immediate. PROCEDURE: An ultrasound guided thoracentesis was thoroughly discussed with the patient and questions answered. The benefits, risks, alternatives and complications were also discussed. The patient understands and wishes to proceed with the procedure. Written consent was obtained. Ultrasound was performed to localize and mark an adequate pocket of fluid in the right chest. The area was then prepped and draped in the normal sterile fashion. 1% Lidocaine was used for local anesthesia. Under ultrasound guidance a 6 Fr Safe-T-Centesis catheter was introduced. Thoracentesis was performed. The catheter was removed and a dressing applied. FINDINGS: A total of approximately 800 mL of thin, bloody pleural fluid was removed. Samples were sent to the laboratory as requested by the clinical  team. IMPRESSION: Successful ultrasound guided right thoracentesis yielding 800 mL of pleural fluid. Read by: Ascencion Dike PA-C Electronically Signed   By: Jerilynn Mages.  Shick M.D.   On: 04/01/2020 11:38       Subjective: Patient feeling well. Pain and dyspnea resolved.   Discharge Exam: Vitals:   04/02/20 0436 04/02/20 1005  BP: (!) 152/69 (!) 160/81  Pulse: 73 89  Resp: 18 18  Temp: 98.8 F (37.1 C) 99.1 F (37.3 C)  SpO2: 94% 98%   Vitals:   04/01/20 2146 04/02/20 0047 04/02/20 0436 04/02/20 1005  BP: 131/68 130/66 (!) 152/69 (!) 160/81  Pulse: 86 70 73 89  Resp: _0 Temp: 98.5 F (36.9 C) 97.9 F (36.6 C) 98.8 F (37.1 C) 99.1 F (37.3 C)  TempSrc: Oral Oral Oral   SpO2: 95% 95% 94% 98%  Weight:   64.8 kg   Height:        General: Pt is alert, awake, not in acute distress Cardiovascular: RRR, nl S1-S2, no murmurs appreciated.   No LE edema.   Respiratory: Normal respiratory rate and rhythm.  CTAB without rales or wheezes. Abdominal: Abdomen soft and non-tender.  No distension or HSM.   Neuro/Psych: Strength symmetric in upper and lower extremities.  Judgment and insight appear normal.   The results of significant diagnostics from this hospitalization (including imaging, microbiology, ancillary and laboratory) are listed below for reference.     Microbiology: Recent Results (from the past 240 hour(s))  Resp Panel by RT-PCR (Flu A&B, Covid) Nasopharyngeal Swab     Status: None   Collection Time: 03/31/20 11:49 PM   Specimen: Nasopharyngeal Swab; Nasopharyngeal(NP) swabs in vial transport medium  Result Value Ref Range Status   SARS Coronavirus 2 by RT PCR NEGATIVE NEGATIVE Final    Comment: (  NOTE) SARS-CoV-2 target nucleic acids are NOT DETECTED.  The SARS-CoV-2 RNA is generally detectable in upper respiratory specimens during the acute phase of infection. The lowest concentration of SARS-CoV-2 viral copies this assay can detect is 138 copies/mL. A  negative result does not preclude SARS-Cov-2 infection and should not be used as the sole basis for treatment or other patient management decisions. A negative result [place] occur with  improper specimen collection/handling, submission of specimen other than nasopharyngeal swab, presence of viral mutation(s) within the areas targeted by this assay, and inadequate number of viral copies(<138 copies/mL). A negative result must be combined with clinical observations, patient history, and epidemiological information. The expected result is Negative.  Fact Sheet for Patients:  EntrepreneurPulse.com.au  Fact Sheet for Healthcare Providers:  IncredibleEmployment.be  This test is no t yet approved or cleared by the [place] FDA and  has been authorized for detection and/or diagnosis of SARS-CoV-2 by FDA under an Emergency Use Authorization (EUA). This EUA will remain  in effect (meaning this test can be used) for the duration of the COVID-19 declaration under Section 564(b)(1) of the Act, 21 U.S.C.section 360bbb-3(b)(1), unless the authorization is terminated  or revoked sooner.       Influenza A by PCR NEGATIVE NEGATIVE Final   Influenza B by PCR NEGATIVE NEGATIVE Final    Comment: (NOTE) The Xpert Xpress SARS-CoV-2/FLU/RSV plus assay is intended as an aid in the diagnosis of influenza from Nasopharyngeal swab specimens and should not be used as a sole basis for treatment. Nasal washings and aspirates are unacceptable for Xpert Xpress SARS-CoV-2/FLU/RSV testing.  Fact Sheet for Patients: EntrepreneurPulse.com.au  Fact Sheet for Healthcare Providers: IncredibleEmployment.be  This test is not yet approved or cleared by the [place] FDA and has been authorized for detection and/or diagnosis of SARS-CoV-2 by FDA under an Emergency Use Authorization (EUA). This EUA will remain in effect (meaning this test can  be used) for the duration of the COVID-19 declaration under Section 564(b)(1) of the Act, 21 U.S.C. section 360bbb-3(b)(1), unless the authorization is terminated or revoked.  Performed at [facility], [place] [address], [place], [place] [address]   Blood Culture (routine x 2)     Status: None (Preliminary result)   Collection Time: 03/31/20 11:50 PM   Specimen: BLOOD  Result Value Ref Range Status   Specimen Description BLOOD RIGHT ANTECUBITAL  Final   Special Requests   Final    BOTTLES DRAWN AEROBIC AND ANAEROBIC Blood Culture adequate volume   Culture   Final    NO GROWTH 1 DAY Performed at [facility], [place] [address] [place], [place] [address]    Report Status PENDING  Incomplete  Blood Culture (routine x 2)     Status: None (Preliminary result)   Collection Time: 04/01/20 12:09 AM   Specimen: BLOOD RIGHT WRIST  Result Value Ref Range Status   Specimen Description BLOOD RIGHT WRIST  Final   Special Requests   Final    BOTTLES DRAWN AEROBIC AND ANAEROBIC Blood Culture adequate volume   Culture   Final    NO GROWTH 1 DAY Performed at [facility], [place] [address], [place], [place] [address]    Report Status PENDING  Incomplete  Body fluid culture     Status: None (Preliminary result)   Collection Time: 04/01/20 10:17 AM   Specimen: Lung, Right; Pleural Fluid  Result Value Ref Range Status   Specimen Description PLEURAL FLUID  Final  Special Requests RIGHT LUNG  Final   Gram Stain   Final    ABUNDANT WBC PRESENT,BOTH PMN AND MONONUCLEAR NO ORGANISMS SEEN    Culture   Final    NO GROWTH < 24 HOURS Performed at [facility], [place] [address] [place], [place] [address]    Report Status PENDING  Incomplete  Urine culture     Status: None   Collection Time: 04/01/20 11:51 AM   Specimen: In/Out Cath Urine  Result Value Ref Range Status   Specimen Description IN/OUT CATH URINE  Final   Special Requests NONE  Final   Culture   Final    NO  GROWTH Performed at [facility], [place] [address] [place], [place] [address]    Report Status 04/02/2020 FINAL  Final     Labs: BNP (last 3 results) No results for input(s): BNP in the last 8760 hours. Basic Metabolic Panel: Recent Labs  Lab 03/31/20 0019 04/02/20 0237  NA 127* 124*  K 4.0 3.6  CL 93* 94*  CO2 25 23  GLUCOSE 165* 196*  BUN 7* 8  CREATININE 0.70 0.68  CALCIUM 7.6* 7.7*  MG  --  1.3*   Liver Function Tests: Recent Labs  Lab 03/31/20 0019 04/02/20 0237  AST 20 21  ALT 9 14  ALKPHOS 74 72  BILITOT 0.7 0.3  PROT 5.1* 4.9*  ALBUMIN 2.0* 1.9*   No results for input(s): LIPASE, AMYLASE in the last 168 hours. No results for input(s): AMMONIA in the last 168 hours. CBC: Recent Labs  Lab 03/31/20 0018 04/02/20 0237  WBC 8.4 5.7  NEUTROABS 6.3 4.1  HGB 10.4* 10.6*  HCT 32.1* 32.2*  MCV 87.0 84.7  PLT 398 435*   Cardiac Enzymes: No results for input(s): CKTOTAL, CKMB, CKMBINDEX, TROPONINI in the last 168 hours. BNP: Invalid input(s): POCBNP CBG: Recent Labs  Lab 04/01/20 1252 04/01/20 1704 04/01/20 2358 04/02/20 0605  GLUCAP 136* 166* 195* 187*   D-Dimer No results for input(s): DDIMER in the last 72 hours. Hgb A1c No results for input(s): HGBA1C in the last 72 hours. Lipid Profile No results for input(s): CHOL, HDL, LDLCALC, TRIG, CHOLHDL, LDLDIRECT in the last 72 hours. Thyroid function studies No results for input(s): TSH, T4TOTAL, T3FREE, THYROIDAB in the last 72 hours.  Invalid input(s): FREET3 Anemia work up No results for input(s): VITAMINB12, FOLATE, FERRITIN, TIBC, IRON, RETICCTPCT in the last 72 hours. Urinalysis    Component Value Date/Time   COLORURINE YELLOW 04/01/2020 1150   APPEARANCEUR CLEAR 04/01/2020 1150   LABSPEC 1.038 (H) 04/01/2020 1150   PHURINE 6.0 04/01/2020 1150   GLUCOSEU NEGATIVE 04/01/2020 1150   GLUCOSEU NEGATIVE 12/28/2013 1036   HGBUR NEGATIVE 04/01/2020 1150   HGBUR trace-intact 06/29/2007  1304   BILIRUBINUR NEGATIVE 04/01/2020 1150   BILIRUBINUR negative 01/19/2017 [place] 04/01/2020 1150   PROTEINUR NEGATIVE 04/01/2020 1150   UROBILINOGEN 0.2 01/19/2017 1049   UROBILINOGEN 1.0 03/25/2015 1755   NITRITE NEGATIVE 04/01/2020 1150   LEUKOCYTESUR LARGE (A) 04/01/2020 1150   Sepsis Labs Invalid input(s): PROCALCITONIN,  WBC,  LACTICIDVEN Microbiology Recent Results (from the past 240 hour(s))  Resp Panel by RT-PCR (Flu A&B, Covid) Nasopharyngeal Swab     Status: None   Collection Time: 03/31/20 11:49 PM   Specimen: Nasopharyngeal Swab; Nasopharyngeal(NP) swabs in vial transport medium  Result Value Ref Range Status   SARS Coronavirus 2 by RT PCR NEGATIVE NEGATIVE Final    Comment: (  NOTE) SARS-CoV-2 target nucleic acids are NOT DETECTED.  The SARS-CoV-2 RNA is generally detectable in upper respiratory specimens during the acute phase of infection. The lowest concentration of SARS-CoV-2 viral copies this assay can detect is 138 copies/mL. A negative result does not preclude SARS-Cov-2 infection and should not be used as the sole basis for treatment or other patient management decisions. A negative result [place] occur with  improper specimen collection/handling, submission of specimen other than nasopharyngeal swab, presence of viral mutation(s) within the areas targeted by this assay, and inadequate number of viral copies(<138 copies/mL). A negative result must be combined with clinical observations, patient history, and epidemiological information. The expected result is Negative.  Fact Sheet for Patients:  EntrepreneurPulse.com.au  Fact Sheet for Healthcare Providers:  IncredibleEmployment.be  This test is no t yet approved or cleared by the [place] FDA and  has been authorized for detection and/or diagnosis of SARS-CoV-2 by FDA under an Emergency Use Authorization (EUA). This EUA will remain  in effect  (meaning this test can be used) for the duration of the COVID-19 declaration under Section 564(b)(1) of the Act, 21 U.S.C.section 360bbb-3(b)(1), unless the authorization is terminated  or revoked sooner.       Influenza A by PCR NEGATIVE NEGATIVE Final   Influenza B by PCR NEGATIVE NEGATIVE Final    Comment: (NOTE) The Xpert Xpress SARS-CoV-2/FLU/RSV plus assay is intended as an aid in the diagnosis of influenza from Nasopharyngeal swab specimens and should not be used as a sole basis for treatment. Nasal washings and aspirates are unacceptable for Xpert Xpress SARS-CoV-2/FLU/RSV testing.  Fact Sheet for Patients: EntrepreneurPulse.com.au  Fact Sheet for Healthcare Providers: IncredibleEmployment.be  This test is not yet approved or cleared by the [place] FDA and has been authorized for detection and/or diagnosis of SARS-CoV-2 by FDA under an Emergency Use Authorization (EUA). This EUA will remain in effect (meaning this test can be used) for the duration of the COVID-19 declaration under Section 564(b)(1) of the Act, 21 U.S.C. section 360bbb-3(b)(1), unless the authorization is terminated or revoked.  Performed at [facility], [place] [address], [place], [place] [address]   Blood Culture (routine x 2)     Status: None (Preliminary result)   Collection Time: 03/31/20 11:50 PM   Specimen: BLOOD  Result Value Ref Range Status   Specimen Description BLOOD RIGHT ANTECUBITAL  Final   Special Requests   Final    BOTTLES DRAWN AEROBIC AND ANAEROBIC Blood Culture adequate volume   Culture   Final    NO GROWTH 1 DAY Performed at [facility], [place] [address] [place], [place] [address]    Report Status PENDING  Incomplete  Blood Culture (routine x 2)     Status: None (Preliminary result)   Collection Time: 04/01/20 12:09 AM   Specimen: BLOOD RIGHT WRIST  Result Value Ref Range Status   Specimen Description BLOOD RIGHT WRIST   Final   Special Requests   Final    BOTTLES DRAWN AEROBIC AND ANAEROBIC Blood Culture adequate volume   Culture   Final    NO GROWTH 1 DAY Performed at [facility], [place] [address], [place], [place] [address]    Report Status PENDING  Incomplete  Body fluid culture     Status: None (Preliminary result)   Collection Time: 04/01/20 10:17 AM   Specimen: Lung, Right; Pleural Fluid  Result Value Ref Range Status   Specimen Description PLEURAL FLUID  Final  Special Requests RIGHT LUNG  Final   Gram Stain   Final    ABUNDANT WBC PRESENT,BOTH PMN AND MONONUCLEAR NO ORGANISMS SEEN    Culture   Final    NO GROWTH < 24 HOURS Performed at [facility], [place] [address], [place], [place] [address]    Report Status PENDING  Incomplete  Urine culture     Status: None   Collection Time: 04/01/20 11:51 AM   Specimen: In/Out Cath Urine  Result Value Ref Range Status   Specimen Description IN/OUT CATH URINE  Final   Special Requests NONE  Final   Culture   Final    NO GROWTH Performed at [facility], [place] [address] [place], [place] [address]    Report Status 04/02/2020 FINAL  Final     Time coordinating discharge: 35 minutes The [place] controlled substances registry was reviewed for this patient      SIGNED:   Edwin Dada, MD  [facility] 04/02/2020, 12:06 PM

## 2020-04-02 NOTE — Progress Notes (Signed)
DISCHARGE NOTE [facility] to be discharged [facility] care per MD order. Patient verbalized understanding.  Skin clean, dry and intact without evidence of skin break down, no evidence of skin tears noted. IV catheter discontinued intact. Site without signs and symptoms of complications. Dressing and pressure applied. Pt denies pain at the site currently. No complaints noted.  Patient free of lines, drains, and wounds.   Discharge packet assembled. An After Visit Summary (AVS) was printed and given to the EMS personnel. Patient escorted via stretcher and discharged to [facility] via ambulance. Report called to accepting facility; all questions and concerns addressed.   Bradley Bostelman S Rokia Bosket, RN _______________________________________________________________________

## 2020-04-03 LAB — CYTOLOGY - NON PAP

## 2020-04-04 LAB — BODY FLUID CULTURE: Culture: NO GROWTH

## 2020-04-06 LAB — CULTURE, BLOOD (ROUTINE X 2)
Culture: NO GROWTH
Culture: NO GROWTH
Special Requests: ADEQUATE
Special Requests: ADEQUATE

## 2020-04-12 DIAGNOSIS — R918 Other nonspecific abnormal finding of lung field: Secondary | ICD-10-CM | POA: Diagnosis not present

## 2020-04-15 DIAGNOSIS — M79662 Pain in left lower leg: Secondary | ICD-10-CM | POA: Diagnosis not present

## 2020-04-15 DIAGNOSIS — F411 Generalized anxiety disorder: Secondary | ICD-10-CM | POA: Diagnosis not present

## 2020-04-15 DIAGNOSIS — I82402 Acute embolism and thrombosis of unspecified deep veins of left lower extremity: Secondary | ICD-10-CM | POA: Diagnosis not present

## 2020-04-15 DIAGNOSIS — F410 Panic disorder [episodic paroxysmal anxiety] without agoraphobia: Secondary | ICD-10-CM | POA: Diagnosis not present

## 2020-04-21 ENCOUNTER — Emergency Department (HOSPITAL_COMMUNITY): Payer: Medicare Other

## 2020-04-21 ENCOUNTER — Emergency Department (HOSPITAL_COMMUNITY)
Admission: EM | Admit: 2020-04-21 | Discharge: 2020-04-21 | Disposition: A | Discharge status: Home / Self Care | Payer: Medicare Other | Attending: Emergency Medicine | Admitting: Emergency Medicine

## 2020-04-21 ENCOUNTER — Other Ambulatory Visit: Payer: Self-pay

## 2020-04-21 DIAGNOSIS — Z9011 Acquired absence of right breast and nipple: Secondary | ICD-10-CM | POA: Insufficient documentation

## 2020-04-21 DIAGNOSIS — Z85038 Personal history of other malignant neoplasm of large intestine: Secondary | ICD-10-CM | POA: Diagnosis not present

## 2020-04-21 DIAGNOSIS — I2510 Atherosclerotic heart disease of native coronary artery without angina pectoris: Secondary | ICD-10-CM | POA: Insufficient documentation

## 2020-04-21 DIAGNOSIS — J90 Pleural effusion, not elsewhere classified: Secondary | ICD-10-CM | POA: Diagnosis not present

## 2020-04-21 DIAGNOSIS — S0003XA Contusion of scalp, initial encounter: Secondary | ICD-10-CM | POA: Diagnosis not present

## 2020-04-21 DIAGNOSIS — I10 Essential (primary) hypertension: Secondary | ICD-10-CM | POA: Diagnosis not present

## 2020-04-21 DIAGNOSIS — S0083XA Contusion of other part of head, initial encounter: Secondary | ICD-10-CM | POA: Diagnosis not present

## 2020-04-21 DIAGNOSIS — E039 Hypothyroidism, unspecified: Secondary | ICD-10-CM | POA: Diagnosis not present

## 2020-04-21 DIAGNOSIS — S0990XA Unspecified injury of head, initial encounter: Secondary | ICD-10-CM | POA: Insufficient documentation

## 2020-04-21 DIAGNOSIS — G9389 Other specified disorders of brain: Secondary | ICD-10-CM | POA: Diagnosis not present

## 2020-04-21 DIAGNOSIS — I119 Hypertensive heart disease without heart failure: Secondary | ICD-10-CM | POA: Insufficient documentation

## 2020-04-21 DIAGNOSIS — Z9889 Other specified postprocedural states: Secondary | ICD-10-CM | POA: Diagnosis not present

## 2020-04-21 DIAGNOSIS — Z794 Long term (current) use of insulin: Secondary | ICD-10-CM | POA: Insufficient documentation

## 2020-04-21 DIAGNOSIS — E119 Type 2 diabetes mellitus without complications: Secondary | ICD-10-CM | POA: Insufficient documentation

## 2020-04-21 DIAGNOSIS — W19XXXA Unspecified fall, initial encounter: Secondary | ICD-10-CM

## 2020-04-21 DIAGNOSIS — Z853 Personal history of malignant neoplasm of breast: Secondary | ICD-10-CM | POA: Insufficient documentation

## 2020-04-21 DIAGNOSIS — M47812 Spondylosis without myelopathy or radiculopathy, cervical region: Secondary | ICD-10-CM | POA: Diagnosis not present

## 2020-04-21 DIAGNOSIS — Z79899 Other long term (current) drug therapy: Secondary | ICD-10-CM | POA: Diagnosis not present

## 2020-04-21 DIAGNOSIS — J45909 Unspecified asthma, uncomplicated: Secondary | ICD-10-CM | POA: Insufficient documentation

## 2020-04-21 DIAGNOSIS — Z7901 Long term (current) use of anticoagulants: Secondary | ICD-10-CM | POA: Diagnosis not present

## 2020-04-21 DIAGNOSIS — S199XXA Unspecified injury of neck, initial encounter: Secondary | ICD-10-CM | POA: Diagnosis not present

## 2020-04-21 DIAGNOSIS — W010XXA Fall on same level from slipping, tripping and stumbling without subsequent striking against object, initial encounter: Secondary | ICD-10-CM | POA: Insufficient documentation

## 2020-04-21 DIAGNOSIS — E041 Nontoxic single thyroid nodule: Secondary | ICD-10-CM | POA: Diagnosis not present

## 2020-04-21 MED ORDER — OXYCODONE-ACETAMINOPHEN 5-325 MG PO TABS
1.0000 | ORAL_TABLET | Freq: Four times a day (QID) | ORAL | 0 refills | Status: DC | PRN
Start: 2020-04-21 — End: 2020-06-11

## 2020-04-21 MED ORDER — OXYCODONE-ACETAMINOPHEN 5-325 MG PO TABS
1.0000 | ORAL_TABLET | Freq: Once | ORAL | Status: AC
  Administered 2020-04-21: 1 via ORAL
  Filled 2020-04-21: qty 1

## 2020-04-21 NOTE — ED Notes (Signed)
[facility] called  

## 2020-04-21 NOTE — ED Triage Notes (Signed)
Pt from nursing facility for witnessed fall from standing. Pt reports trip and fall. Per EMS, pt takes lovenox twice a day.  Pt has c-collar in place. Pt complains of 10/10 head pain and left elbow pain.

## 2020-04-21 NOTE — ED Provider Notes (Signed)
[facility] EMERGENCY DEPARTMENT Provider Note   CSN: [id number] Arrival date & time: 04/21/20  1646     History Chief Complaint  Patient presents with  . Fall    [name] is a 86 y.o. female.  HPI Patient reports that she was just finishing going to the bathroom and washing her hands.  She had turned to try to get her walker and lost her balance falling directly backwards.  She reports she struck her head on a concrete floor very hard.  She denies having any loss of consciousness.  She reports she got a lot of posterior headache.  She denies any nausea vomiting or confusion.  She denies any weakness numbness or tingling into her arms.  She reports she does not have any specific pain in her neck but is all the back of her head.  No numbness or tingling going into her arms.  She reports she did hit her left elbow and that sore although she can move it without difficulty.  She denies any pain in the back, chest or lower extremities.  Patient is on daily Lovenox.  She reports she had a DVT on the left.  She reports she is status post a partial colectomy for colon cancer 11/21.    Past Medical History:  Diagnosis Date  . Anemia   . Anxiety   . Asthma   . Breast cancer ([place]) 2014   right breast  . Cancer of right breast ([place]) 12/26/12   right breast 12:00 o'clock, DCIS  . Carotid artery disease ([place])   . Carpal tunnel syndrome, bilateral   . Chronic bronchitis ([place])   . Chronic cough   . Chronic facial pain   . Chronic foot pain   . Colon cancer ([place]) dx'd 11/2019  . Complication of anesthesia    Sore jaw; could not chew or move mouth, prolonged sedation  . Convulsions/seizures ([place]) 10/16/2014  . Diabetes mellitus    type 2 niddm x 20 years  . Dyslipidemia   . Ejection fraction   . Gait abnormality 12/04/2019  . GERD (gastroesophageal reflux disease)   . Hammer toe    bilateral  . History of colonic polyps   . History of meningioma   . HTN (hypertension)    . Hx of radiation therapy 03/07/13- 03/29/13   right breast 4250 cGy 17 sessions  . Hyperlipidemia   . Hypokalemia   . Hyponatremia   . Hypothyroidism   . IBS (irritable bowel syndrome)   . Melanoma ([place])   . Metatarsal bone fracture 2014  . Multiple drug allergies   . Nontoxic thyroid nodule   . Obesity   . Osteoarthritis   . Osteoporosis   . Palpitations   . Personal history of radiation therapy 2014  . Seasonal allergies   . Skin cancer   . Syncope   . Tremor, essential 08/18/2016  . Vitamin B12 deficiency   . Vitamin D deficiency     Patient Active Problem List   Diagnosis Date Noted  . Pleural effusion on right 04/01/2020  . Acute respiratory failure with hypoxia ([place]) 04/01/2020  . Pneumonia of right lung due to infectious organism 04/01/2020  . Uncontrolled type 2 diabetes mellitus with hyperglycemia, with long-term current use of insulin ([place]) 04/01/2020  . Abnormal CT of the chest 04/01/2020  . Subacute pulmonary embolism ([place]) 04/01/2020  . Acute metabolic encephalopathy 49/75/3005  . Adenocarcinoma of colon ([place]) 04/01/2020  . Nausea & vomiting 03/12/2020  .  Cancer of ascending colon pT3pN0 (0/12 LN) s/p lap right colectomy 03/06/2020 03/09/2020  . History of multiple allergies 03/09/2020  . S/P right hemicolectomy 03/06/2020  . Closed fracture of shaft of tibia 02/12/2020  . Gait abnormality 12/04/2019  . Impaired left ventricular function 09/05/2019  . Hypokalemia   . Effusion of left knee joint   . Hyponatremia   . Left patella fracture 10/26/2018  . Fracture of patella 10/26/2018  . Acquired hypothyroidism 08/25/2017  . 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 Hammer toe 08/25/2017  . History of breast cancer 08/25/2017  . Nontoxic thyroid nodule 08/25/2017  . Osteopetrosis 08/25/2017  . Dysuria 01/19/2017  . Vitamin D deficiency 11/18/2016  . Tremor, essential 08/18/2016  . Seizure disorder ([place]) 10/16/2014  . Brain tumor (benign)  ([place]) 10/18/2013  . Subdural hemorrhage ([place]) 10/09/2013  . Anxiety 10/09/2013  . Compression fracture of T12 vertebra ([place]) 10/09/2013  . Nontoxic multinodular goiter 09/05/2013  . Syncope 08/27/2013  . Carotid artery disease ([place]) 08/27/2013  . Abnormal thyroid ultrasound 08/27/2013  . Cancer of central portion of female breast ([place]) 12/30/2012  . Osteoporosis 03/06/2010  . ASTHMA 03/05/2009  . Asthma 03/05/2009  . IRRITABLE BOWEL SYNDROME  diarrhea type 03/21/2008  . ANEMIA-NOS 12/16/2006  . GERD 12/16/2006  . Insulin-requiring or dependent type II diabetes mellitus ([place]) 10/29/2006  . Mixed hyperlipidemia 10/29/2006  . Essential hypertension 10/29/2006  . Allergic rhinitis, cause unspecified 10/29/2006  . OSTEOARTHRITIS 10/29/2006    Past Surgical History:  Procedure Laterality Date  . ABDOMINAL HYSTERECTOMY    . BIOPSY  11/07/2019   Procedure: BIOPSY;  Surgeon: Ronald Lobo, MD;  Location: [facility];  Service: Endoscopy;;  . BRAIN SURGERY    . BREAST BIOPSY Right 01/24/2013   Procedure: RE-EXCICION OF BREAST CANCER, ANTERIOR MARGINS;  Surgeon: Edward Jolly, MD;  Location: [facility];  Service: General;  Laterality: Right;  . BREAST LUMPECTOMY Right 2014  . BREAST LUMPECTOMY WITH NEEDLE LOCALIZATION Right 01/17/2013   Procedure: BREAST LUMPECTOMY WITH NEEDLE LOCALIZATION;  Surgeon: Edward Jolly, MD;  Location: [facility];  Service: General;  Laterality: Right;  . BUNIONECTOMY Bilateral   . CATARACT EXTRACTION W/ INTRAOCULAR LENS IMPLANT Right   . CHOLECYSTECTOMY    . COLONOSCOPY    . COLONOSCOPY WITH PROPOFOL N/A 11/07/2019   Procedure: COLONOSCOPY WITH PROPOFOL;  Surgeon: Ronald Lobo, MD;  Location: [facility];  Service: Endoscopy;  Laterality: N/A;  . CRANIOTOMY Right 10/18/2013   Procedure: CRANIOTOMY TUMOR EXCISION;  Surgeon: Floyce Stakes, MD;  Location: [facility];  Service: Neurosurgery;  Laterality: Right;  . ESOPHAGOGASTRODUODENOSCOPY (EGD) WITH  PROPOFOL N/A 11/07/2019   Procedure: ESOPHAGOGASTRODUODENOSCOPY (EGD) WITH PROPOFOL;  Surgeon: Ronald Lobo, MD;  Location: [facility];  Service: Endoscopy;  Laterality: N/A;  . EYE SURGERY    . HERNIA REPAIR    . IR THORACENTESIS ASP PLEURAL SPACE W/IMG GUIDE  04/01/2020  . KNEE ARTHROSCOPY Bilateral   . LAPAROSCOPIC RIGHT HEMI COLECTOMY Right 03/06/2020   Procedure: LAPAROSCOPIC RIGHT HEMI COLECTOMY WITH TAP BLOCK AND LYSIS OF ADHESIONS;  Surgeon: Ileana Roup, MD;  Location: [facility];  Service: General;  Laterality: Right;  . POLYPECTOMY     small adenomatous  . ULNAR TUNNEL RELEASE       OB History   No obstetric history on file.     Family History  Problem Relation Age of Onset  . Heart disease Mother   . Osteoporosis Mother   .  Diabetes Father   . Pancreatic cancer Father   . Colon cancer Other   . Bone cancer Sister   . Prostate cancer Brother   . Colon cancer Brother   . Rectal cancer Sister   . Thyroid disease Sister        benign goiter resected    Social History   Tobacco Use  . Smoking status: Never Smoker  . Smokeless tobacco: Never Used  Vaping Use  . Vaping Use: Never used  Substance Use Topics  . Alcohol use: No  . Drug use: No    Home Medications Prior to Admission medications   Medication Sig Start Date End Date Taking? Authorizing Provider  ALPRAZolam (XANAX) 0.25 MG tablet TAKE 1 TABLET AT BEDTIME AS NEEDED (CAN TAKE 1 TABLET DURING THE DAY IF NEEDED FOR ANXIETY) Patient taking differently: Take 0.25 mg by mouth at bedtime as needed. TAKE 1 TABLET AT BEDTIME AS NEEDED (CAN TAKE 1 TABLET DURING THE DAY IF NEEDED FOR ANXIETY) 04/02/20  Yes Danford, Suann Larry, MD  Biotin 1000 MCG tablet Take 1,000 mcg by mouth daily.    Yes [provider]  Blood Glucose Monitoring Suppl (PRODIGY VOICE BLOOD GLUCOSE) w/Device KIT Use to check blood sugar 1 time per day. 10/18/15  Yes Renato Shin, MD  Cholecalciferol (VITAMIN D-3) 125 MCG (5000  UT) TABS Take 5,000 Units by mouth in the morning and at bedtime.   Yes [provider]  cholestyramine light (PREVALITE) 4 g packet Take 2 g by mouth in the morning and at bedtime.  11/29/19  Yes [provider]  Cranberry 425 MG CAPS Take 425 mg by mouth daily.   Yes [provider]  cyanocobalamin (,VITAMIN B-12,) 1000 MCG/ML injection INJECT 1 ML INTRAMUSCULARLY EVERY 21 DAYS Patient taking differently: Inject 1,000 mcg into the muscle every 21 ( twenty-one) days. 06/18/16  Yes Hoyt Koch, MD  diclofenac Sodium (VOLTAREN) 1 % GEL Apply 2 g topically 4 (four) times daily. 06/01/19  Yes [provider]  docusate sodium (COLACE) 100 MG capsule Take 200 mg by mouth 2 (two) times daily as needed (for constipation.).    Yes [provider]  enoxaparin (LOVENOX) 60 MG/0.6ML injection Inject 60 mg into the skin every 12 (twelve) hours.   Yes [provider]  escitalopram (LEXAPRO) 10 MG tablet Take 10 mg by mouth daily.   Yes [provider]  feeding supplement, GLUCERNA SHAKE, (GLUCERNA SHAKE) LIQD Take 237 mLs by mouth 2 (two) times daily.   Yes [provider]  fluconazole (DIFLUCAN) 150 MG tablet Take 150 mg by mouth daily.   Yes [provider]  glucose blood test strip Use to check blood sugar 1 time per day. 10/22/15  Yes Renato Shin, MD  insulin aspart (NOVOLOG) 100 UNIT/ML FlexPen Inject 2-12 Units into the skin 3 (three) times daily with meals. Injects 2-10 units of insulin under the skin 3 times a day per sliding scale: CBG 0-150: 2 units; CBG 151-200: 4 units; CBG 201-250: 6 units; CBG 251-300: 8 units; CBG 301-350: 10 units; CBG 351-400: 12 units; CBG >400: 12 units and notify the MD/NP 06/02/19  Yes [provider]  insulin glargine (LANTUS) 100 UNIT/ML injection Inject 14 Units into the skin at bedtime.    Yes [provider]  Insulin Pen Needle 32G X 4 MM MISC Used to inject insulin 3x  daily 11/19/16  Yes Renato Shin, MD  levothyroxine (SYNTHROID) 75 MCG tablet Take 75  mcg by mouth daily before breakfast.   Yes [provider]  lidocaine (LIDODERM) 5 % Place 1 patch onto the skin daily. Remove & Discard patch within 12 hours or as directed by MD 01/25/15  Yes Hoyt Koch, MD  ondansetron (ZOFRAN-ODT) 4 MG disintegrating tablet Take 1 tablet (4 mg total) by mouth every 6 (six) hours as needed for nausea. 03/19/20  Yes Ileana Roup, MD  oxycodone (OXY-IR) 5 MG capsule Take 5 mg by mouth in the morning, at noon, and at bedtime.   Yes [provider]  phenytoin (DILANTIN) 100 MG ER capsule Take 1 capsule (100 mg) in the morning and Take 2 capsules (200 mg) at bedtime Patient taking differently: Take 100-200 mg by mouth See admin instructions. Take 1 capsule (100 mg) in the morning & Take 2 capsules (200 mg) at bedtime 05/16/19  Yes Ward Givens, NP  Polyethyl Glycol-Propyl Glycol (SYSTANE OP) Place 1 drop into both eyes in the morning, at noon, in the evening, and at bedtime.    Yes [provider]  primidone (MYSOLINE) 50 MG tablet Three tablets in the morning and one at dinner time Patient taking differently: Take 50 mg by mouth in the morning and at bedtime. 12/04/19  Yes Kathrynn Ducking, MD  Thiamine HCl (VITAMIN B-1) 250 MG tablet Take 250 mg by mouth at bedtime.   Yes [provider]  trandolapril-verapamil (TARKA) 2-240 MG tablet Take 1 tablet by mouth at bedtime.   Yes [provider]  oxyCODONE-acetaminophen (PERCOCET) 5-325 MG tablet Take 1-2 tablets by mouth every 6 (six) hours as needed. 04/21/20   Charlesetta Shanks, MD    Allergies    Bystolic [nebivolol hcl], Cholestyramine, Hydrazine yellow [tartrazine], Morphine, Niacin, Niaspan [niacin er], Norvasc [amlodipine besylate], Optivar [azelastine hcl], Repaglinide, Sular [nisoldipine er], Tegretol [carbamazepine], Telmisartan, Amoxicillin-pot clavulanate,  Cefdinir, Ciprofloxacin hcl, Clonidine, Clonidine hydrochloride, Codeine, Ezetimibe, Hydroxychloroquine, Naproxen, Sulfa antibiotics, Ziac [bisoprolol-hydrochlorothiazide], Azelastine, Elavil [amitriptyline], Empagliflozin, Hydralazine, Iodine i 131 tositumomab, Kenalog [triamcinolone], Keppra [levetiracetam], Lamictal [lamotrigine], Lyrica [pregabalin], Norvasc [amlodipine], Pseudoephedrine, Pseudoephedrine-guaifenesin er, Risedronate, Ru-hist d [brompheniramine-phenylephrine], Topamax [topiramate], Ace inhibitors, Actonel [risedronate sodium], Amlodipine besylate, Aspirin, Atacand [candesartan], Bextra [valdecoxib], Bisoprolol-hydrochlorothiazide, Candesartan cilexetil, Cefadroxil, Celecoxib, Hydrocodone, Hydrocodone-acetaminophen, Iodinated diagnostic agents, Meloxicam, Methylprednisolone sodium succinate, Nabumetone, Penicillins, Pseudoephedrine-guaifenesin, Risedronate sodium, Rofecoxib, Ru-tuss [chlorphen-pse-atrop-hyos-scop], Sulfonamide derivatives, Sulphur [sulfur], Telithromycin, Terfenadine, Trandolapril-verapamil hcl er, Trandolapril-verapamil hcl er, and Valium [diazepam]  Review of Systems   Review of Systems 10 systems reviewed and negative except as per HPI Physical Exam Updated Vital Signs BP (!) 164/97   Pulse (!) 34   Temp 98.6 F (37 C) (Oral)   Resp 19   Ht '5\' 7"'  (1.702 m)   Wt 64.8 kg   SpO2 97%   BMI 22.37 kg/m   Physical Exam Constitutional:      Comments: Alert and nontoxic.  Mental status clear.  No respiratory distress.  Patient is uncomfortable  HENT:     Head:     Comments: 4 cm hematoma posterior occiput.  Superficial abrasion overlying.  No active bleeding.    Nose: Nose normal.     Mouth/Throat:     Pharynx: Oropharynx is clear.  Eyes:     Extraocular Movements: Extraocular movements intact.     Pupils: Pupils are equal, round, and reactive to light.  Neck:     Comments: No midline C-spine tenderness. Cardiovascular:     Rate and Rhythm: Normal rate.   Pulmonary:     Effort: Pulmonary effort is  normal.     Breath sounds: Normal breath sounds.  Chest:     Chest wall: No tenderness.  Abdominal:     General: There is no distension.     Tenderness: There is no abdominal tenderness. There is no guarding.  Musculoskeletal:        General: No swelling or tenderness. Normal range of motion.     Comments: Patient has 1-2+ edema of the left lower extremity.  No pain with range of motion of the hip knee ankle.  No deformities of the extremities.  Patient has discomfort of the left elbow but no visible abrasion or deformity.  Skin:    General: Skin is warm and dry.  Neurological:     General: No focal deficit present.     Mental Status: She is oriented to person, place, and time.     Cranial Nerves: No cranial nerve deficit.     Coordination: Coordination normal.     ED Results / Procedures / Treatments   Labs (all labs ordered are listed, but only abnormal results are displayed) Labs Reviewed - No data to display  EKG None  Radiology CT Head Wo Contrast  Result Date: 04/21/2020 CLINICAL DATA:  Head trauma EXAM: CT HEAD WITHOUT CONTRAST TECHNIQUE: Contiguous axial images were obtained from the base of the skull through the vertex without intravenous contrast. COMPARISON:  10/31/2019 FINDINGS: Brain: Chronic encephalomalacia right frontal lobe unchanged. No acute infarct or hemorrhage. Lateral ventricles and midline structures are stable. No acute extra-axial fluid collections. No mass effect. Vascular: No hyperdense vessel or unexpected calcification. Skull: Stable postsurgical changes from right frontal craniotomy. No acute bony abnormalities. Sinuses/Orbits: No acute finding. Other: None. IMPRESSION: 1. Stable postsurgical changes right frontal lobe. 2. No acute intracranial process. Electronically Signed   By: Randa Ngo M.D.   On: 04/21/2020 19:31   CT Cervical Spine Wo Contrast  Result Date: 04/21/2020 CLINICAL DATA:  Neck  trauma EXAM: CT CERVICAL SPINE WITHOUT CONTRAST TECHNIQUE: Multidetector CT imaging of the cervical spine was performed without intravenous contrast. Multiplanar CT image reconstructions were also generated. COMPARISON:  12/28/2018 FINDINGS: Alignment: Minimal anterolisthesis of C5 on C6. Otherwise alignment is anatomic. Skull base and vertebrae: No acute fracture. No primary bone lesion or focal pathologic process. Soft tissues and spinal canal: Stable 2.3 cm heterogeneous hypodense nodule left lobe thyroid, which has previously been evaluated by ultrasound. Remaining soft tissues are unremarkable. Disc levels: Mild spondylosis at C4-5 and C6-7. Right predominant facet hypertrophy at C4-5, C5-6, and C6-7. Upper chest: Right pleural effusion is noted. Lung apices are otherwise clear. Central airway is patent. Other: Reconstructed images demonstrate no additional findings. IMPRESSION: 1. No acute cervical spine fracture. 2. Prominent spondylosis and facet hypertrophy from C4-5 through C6-7. 3. Right pleural effusion. Electronically Signed   By: Randa Ngo M.D.   On: 04/21/2020 19:42    Procedures Procedures (including critical care time)  Medications Ordered in ED Medications  oxyCODONE-acetaminophen (PERCOCET/ROXICET) 5-325 MG per tablet 1 tablet (has no administration in time range)    ED Course  I have reviewed the triage vital signs and the nursing notes.  Pertinent labs & imaging results that were available during my care of the patient were reviewed by me and considered in my medical decision making (see chart for details).    MDM Rules/Calculators/A&P                          CT head  and neck do not show any acute findings.  Patient's mental status has remained stable.  She is alert and appropriate.  No signs of confusion.  No signs of other significant injury.  At this time, stable for discharge.  Patient does have a hematoma and a persistent headache.  Patient is prescribed 1 Percocet  every 6 hours as needed for moderate to severe headache.  She is advised for mild to moderate headache to use extra strength Tylenol.  Return precautions reviewed. Final Clinical Impression(s) / ED Diagnoses Final diagnoses:  Fall, initial encounter  Injury of head, initial encounter  Hematoma of scalp, initial encounter  Anticoagulated    Rx / DC Orders ED Discharge Orders         Ordered    oxyCODONE-acetaminophen (PERCOCET) 5-325 MG tablet  Every 6 hours PRN        04/21/20 2017           Charlesetta Shanks, MD 04/21/20 2022

## 2020-04-21 NOTE — ED Notes (Signed)
Attempted to call report to [facility]. Not answering phone at this time

## 2020-04-21 NOTE — ED Notes (Signed)
Pt discharged via [facility]. All questions and concerns addressed. No complaints at this time.

## 2020-04-21 NOTE — Discharge Instructions (Addendum)
1.  You Neels rest and sleep.  You should have a recheck by care provider a family member over the next 24 hours. 2.  If you get a suddenly worsening headache, if you develop any confusion, vomiting, visual changes return to the emergency department immediately for recheck. 3.  You Emily take 1 Percocet tablet every 6 hours for severe headache.  For mild to moderate headache take 2 extra strength Tylenol. 4.  You have a hematoma on your scalp.  This is a blood collection beneath the skin like a big bruise.  Try to sleep with your head of bed elevated about 30 degrees to help with decreasing the swelling.  You [place]man apply a well wrapped ice pack for 20 minutes every several hours.

## 2020-04-25 DIAGNOSIS — G40909 Epilepsy, unspecified, not intractable, without status epilepticus: Secondary | ICD-10-CM | POA: Diagnosis not present

## 2020-04-25 DIAGNOSIS — R2689 Other abnormalities of gait and mobility: Secondary | ICD-10-CM | POA: Diagnosis not present

## 2020-04-25 DIAGNOSIS — E119 Type 2 diabetes mellitus without complications: Secondary | ICD-10-CM | POA: Diagnosis not present

## 2020-04-25 DIAGNOSIS — J9601 Acute respiratory failure with hypoxia: Secondary | ICD-10-CM | POA: Diagnosis not present

## 2020-04-25 DIAGNOSIS — M6281 Muscle weakness (generalized): Secondary | ICD-10-CM | POA: Diagnosis not present

## 2020-04-25 DIAGNOSIS — F410 Panic disorder [episodic paroxysmal anxiety] without agoraphobia: Secondary | ICD-10-CM | POA: Diagnosis not present

## 2020-04-25 DIAGNOSIS — J90 Pleural effusion, not elsewhere classified: Secondary | ICD-10-CM | POA: Diagnosis not present

## 2020-04-25 DIAGNOSIS — F411 Generalized anxiety disorder: Secondary | ICD-10-CM | POA: Diagnosis not present

## 2020-04-30 DIAGNOSIS — J9601 Acute respiratory failure with hypoxia: Secondary | ICD-10-CM | POA: Diagnosis not present

## 2020-04-30 DIAGNOSIS — C182 Malignant neoplasm of ascending colon: Secondary | ICD-10-CM | POA: Diagnosis not present

## 2020-04-30 DIAGNOSIS — F419 Anxiety disorder, unspecified: Secondary | ICD-10-CM | POA: Diagnosis not present

## 2020-04-30 DIAGNOSIS — D62 Acute posthemorrhagic anemia: Secondary | ICD-10-CM | POA: Diagnosis not present

## 2020-04-30 DIAGNOSIS — J181 Lobar pneumonia, unspecified organism: Secondary | ICD-10-CM | POA: Diagnosis not present

## 2020-04-30 DIAGNOSIS — Z9181 History of falling: Secondary | ICD-10-CM | POA: Diagnosis not present

## 2020-04-30 DIAGNOSIS — K589 Irritable bowel syndrome without diarrhea: Secondary | ICD-10-CM | POA: Diagnosis not present

## 2020-04-30 DIAGNOSIS — I2699 Other pulmonary embolism without acute cor pulmonale: Secondary | ICD-10-CM | POA: Diagnosis not present

## 2020-04-30 DIAGNOSIS — G40909 Epilepsy, unspecified, not intractable, without status epilepticus: Secondary | ICD-10-CM | POA: Diagnosis not present

## 2020-04-30 DIAGNOSIS — I2510 Atherosclerotic heart disease of native coronary artery without angina pectoris: Secondary | ICD-10-CM | POA: Diagnosis not present

## 2020-04-30 DIAGNOSIS — Z794 Long term (current) use of insulin: Secondary | ICD-10-CM | POA: Diagnosis not present

## 2020-04-30 DIAGNOSIS — I10 Essential (primary) hypertension: Secondary | ICD-10-CM | POA: Diagnosis not present

## 2020-04-30 DIAGNOSIS — K229 Disease of esophagus, unspecified: Secondary | ICD-10-CM | POA: Diagnosis not present

## 2020-04-30 DIAGNOSIS — E039 Hypothyroidism, unspecified: Secondary | ICD-10-CM | POA: Diagnosis not present

## 2020-04-30 DIAGNOSIS — E871 Hypo-osmolality and hyponatremia: Secondary | ICD-10-CM | POA: Diagnosis not present

## 2020-04-30 DIAGNOSIS — Z483 Aftercare following surgery for neoplasm: Secondary | ICD-10-CM | POA: Diagnosis not present

## 2020-04-30 DIAGNOSIS — E119 Type 2 diabetes mellitus without complications: Secondary | ICD-10-CM | POA: Diagnosis not present

## 2020-04-30 DIAGNOSIS — S82202D Unspecified fracture of shaft of left tibia, subsequent encounter for closed fracture with routine healing: Secondary | ICD-10-CM | POA: Diagnosis not present

## 2020-05-03 DIAGNOSIS — C182 Malignant neoplasm of ascending colon: Secondary | ICD-10-CM | POA: Diagnosis not present

## 2020-05-03 DIAGNOSIS — D62 Acute posthemorrhagic anemia: Secondary | ICD-10-CM | POA: Diagnosis not present

## 2020-05-03 DIAGNOSIS — Z483 Aftercare following surgery for neoplasm: Secondary | ICD-10-CM | POA: Diagnosis not present

## 2020-05-03 DIAGNOSIS — J181 Lobar pneumonia, unspecified organism: Secondary | ICD-10-CM | POA: Diagnosis not present

## 2020-05-03 DIAGNOSIS — I2699 Other pulmonary embolism without acute cor pulmonale: Secondary | ICD-10-CM | POA: Diagnosis not present

## 2020-05-03 DIAGNOSIS — J9601 Acute respiratory failure with hypoxia: Secondary | ICD-10-CM | POA: Diagnosis not present

## 2020-05-07 DIAGNOSIS — I2699 Other pulmonary embolism without acute cor pulmonale: Secondary | ICD-10-CM | POA: Diagnosis not present

## 2020-05-07 DIAGNOSIS — J181 Lobar pneumonia, unspecified organism: Secondary | ICD-10-CM | POA: Diagnosis not present

## 2020-05-07 DIAGNOSIS — C182 Malignant neoplasm of ascending colon: Secondary | ICD-10-CM | POA: Diagnosis not present

## 2020-05-07 DIAGNOSIS — Z483 Aftercare following surgery for neoplasm: Secondary | ICD-10-CM | POA: Diagnosis not present

## 2020-05-07 DIAGNOSIS — J9601 Acute respiratory failure with hypoxia: Secondary | ICD-10-CM | POA: Diagnosis not present

## 2020-05-07 DIAGNOSIS — D62 Acute posthemorrhagic anemia: Secondary | ICD-10-CM | POA: Diagnosis not present

## 2020-05-08 DIAGNOSIS — C182 Malignant neoplasm of ascending colon: Secondary | ICD-10-CM | POA: Diagnosis not present

## 2020-05-08 DIAGNOSIS — Z483 Aftercare following surgery for neoplasm: Secondary | ICD-10-CM | POA: Diagnosis not present

## 2020-05-08 DIAGNOSIS — J9601 Acute respiratory failure with hypoxia: Secondary | ICD-10-CM | POA: Diagnosis not present

## 2020-05-08 DIAGNOSIS — D62 Acute posthemorrhagic anemia: Secondary | ICD-10-CM | POA: Diagnosis not present

## 2020-05-08 DIAGNOSIS — J181 Lobar pneumonia, unspecified organism: Secondary | ICD-10-CM | POA: Diagnosis not present

## 2020-05-08 DIAGNOSIS — I2699 Other pulmonary embolism without acute cor pulmonale: Secondary | ICD-10-CM | POA: Diagnosis not present

## 2020-05-10 DIAGNOSIS — C182 Malignant neoplasm of ascending colon: Secondary | ICD-10-CM | POA: Diagnosis not present

## 2020-05-10 DIAGNOSIS — J181 Lobar pneumonia, unspecified organism: Secondary | ICD-10-CM | POA: Diagnosis not present

## 2020-05-10 DIAGNOSIS — Z483 Aftercare following surgery for neoplasm: Secondary | ICD-10-CM | POA: Diagnosis not present

## 2020-05-10 DIAGNOSIS — I2699 Other pulmonary embolism without acute cor pulmonale: Secondary | ICD-10-CM | POA: Diagnosis not present

## 2020-05-10 DIAGNOSIS — J9601 Acute respiratory failure with hypoxia: Secondary | ICD-10-CM | POA: Diagnosis not present

## 2020-05-10 DIAGNOSIS — D62 Acute posthemorrhagic anemia: Secondary | ICD-10-CM | POA: Diagnosis not present

## 2020-05-14 ENCOUNTER — Other Ambulatory Visit: Payer: Self-pay | Admitting: Family Medicine

## 2020-05-14 DIAGNOSIS — Z1231 Encounter for screening mammogram for malignant neoplasm of breast: Secondary | ICD-10-CM

## 2020-05-15 DIAGNOSIS — C182 Malignant neoplasm of ascending colon: Secondary | ICD-10-CM | POA: Diagnosis not present

## 2020-05-15 DIAGNOSIS — D62 Acute posthemorrhagic anemia: Secondary | ICD-10-CM | POA: Diagnosis not present

## 2020-05-15 DIAGNOSIS — J9601 Acute respiratory failure with hypoxia: Secondary | ICD-10-CM | POA: Diagnosis not present

## 2020-05-15 DIAGNOSIS — I2699 Other pulmonary embolism without acute cor pulmonale: Secondary | ICD-10-CM | POA: Diagnosis not present

## 2020-05-15 DIAGNOSIS — J181 Lobar pneumonia, unspecified organism: Secondary | ICD-10-CM | POA: Diagnosis not present

## 2020-05-15 DIAGNOSIS — Z483 Aftercare following surgery for neoplasm: Secondary | ICD-10-CM | POA: Diagnosis not present

## 2020-05-16 ENCOUNTER — Other Ambulatory Visit: Payer: Self-pay | Admitting: Emergency Medicine

## 2020-05-16 DIAGNOSIS — J181 Lobar pneumonia, unspecified organism: Secondary | ICD-10-CM | POA: Diagnosis not present

## 2020-05-16 DIAGNOSIS — Z483 Aftercare following surgery for neoplasm: Secondary | ICD-10-CM | POA: Diagnosis not present

## 2020-05-16 DIAGNOSIS — J9601 Acute respiratory failure with hypoxia: Secondary | ICD-10-CM | POA: Diagnosis not present

## 2020-05-16 DIAGNOSIS — C182 Malignant neoplasm of ascending colon: Secondary | ICD-10-CM | POA: Diagnosis not present

## 2020-05-16 DIAGNOSIS — D62 Acute posthemorrhagic anemia: Secondary | ICD-10-CM | POA: Diagnosis not present

## 2020-05-16 DIAGNOSIS — I2699 Other pulmonary embolism without acute cor pulmonale: Secondary | ICD-10-CM | POA: Diagnosis not present

## 2020-05-16 MED ORDER — PRIMIDONE 50 MG PO TABS
ORAL_TABLET | ORAL | 1 refills | Status: DC
Start: 2020-05-16 — End: 2020-05-22

## 2020-05-21 DIAGNOSIS — J9601 Acute respiratory failure with hypoxia: Secondary | ICD-10-CM | POA: Diagnosis not present

## 2020-05-21 DIAGNOSIS — Z483 Aftercare following surgery for neoplasm: Secondary | ICD-10-CM | POA: Diagnosis not present

## 2020-05-21 DIAGNOSIS — C182 Malignant neoplasm of ascending colon: Secondary | ICD-10-CM | POA: Diagnosis not present

## 2020-05-21 DIAGNOSIS — I2699 Other pulmonary embolism without acute cor pulmonale: Secondary | ICD-10-CM | POA: Diagnosis not present

## 2020-05-21 DIAGNOSIS — D62 Acute posthemorrhagic anemia: Secondary | ICD-10-CM | POA: Diagnosis not present

## 2020-05-21 DIAGNOSIS — J181 Lobar pneumonia, unspecified organism: Secondary | ICD-10-CM | POA: Diagnosis not present

## 2020-05-22 ENCOUNTER — Telehealth: Payer: Self-pay | Admitting: *Deleted

## 2020-05-22 MED ORDER — PRIMIDONE 50 MG PO TABS
ORAL_TABLET | ORAL | 1 refills | Status: DC
Start: 2020-05-22 — End: 2020-05-23

## 2020-05-22 NOTE — Telephone Encounter (Signed)
I called and spoke to patient to make her aware that we had sent the Rx to [facility] on 05/16/2020 and she says that they called her to confirm that they were sending it to her, but she still has not received anything. Patient says that her hands are trembling so bad, a little worse since returning home from the nursing home recently. She says that she's been taking the medication, 3 tablets in the AM and 2 tablets in the PM, because she felt like it was wearing off too quickly when she was only taking 1 in the PM. Are you ok with this change? If so, is it ok to send a 30 day supply to her local pharmacy, [facility]?

## 2020-05-22 NOTE — Telephone Encounter (Signed)
Pt called need update on primidone medication. Please call [phone]

## 2020-05-23 MED ORDER — PRIMIDONE 50 MG PO TABS
ORAL_TABLET | ORAL | 1 refills | Status: DC
Start: 2020-05-23 — End: 2020-06-18

## 2020-05-23 NOTE — Telephone Encounter (Signed)
Rx sent to the [facility] on [address]

## 2020-05-23 NOTE — Addendum Note (Signed)
Addended by: Belinda Block A on: 05/23/2020 10:27 AM   Modules accepted: Orders

## 2020-05-23 NOTE — Telephone Encounter (Signed)
Pt's care taker(Amy) is asking that pt's primidone (MYSOLINE) 50 MG tablet be called into [facility] on [address] [phone] because the normal pharmacy is out.  Amy can be reached at [phone] if there are questions on this request

## 2020-05-27 ENCOUNTER — Other Ambulatory Visit: Payer: Self-pay

## 2020-05-27 ENCOUNTER — Encounter: Payer: Self-pay | Admitting: Podiatry

## 2020-05-27 ENCOUNTER — Ambulatory Visit (INDEPENDENT_AMBULATORY_CARE_PROVIDER_SITE_OTHER): Payer: Medicare Other | Admitting: Podiatry

## 2020-05-27 DIAGNOSIS — L84 Corns and callosities: Secondary | ICD-10-CM

## 2020-05-27 DIAGNOSIS — B351 Tinea unguium: Secondary | ICD-10-CM | POA: Diagnosis not present

## 2020-05-27 DIAGNOSIS — E1142 Type 2 diabetes mellitus with diabetic polyneuropathy: Secondary | ICD-10-CM | POA: Diagnosis not present

## 2020-05-27 DIAGNOSIS — I2699 Other pulmonary embolism without acute cor pulmonale: Secondary | ICD-10-CM | POA: Diagnosis not present

## 2020-05-27 DIAGNOSIS — Z483 Aftercare following surgery for neoplasm: Secondary | ICD-10-CM | POA: Diagnosis not present

## 2020-05-27 DIAGNOSIS — J9601 Acute respiratory failure with hypoxia: Secondary | ICD-10-CM | POA: Diagnosis not present

## 2020-05-27 DIAGNOSIS — K5900 Constipation, unspecified: Secondary | ICD-10-CM | POA: Insufficient documentation

## 2020-05-27 DIAGNOSIS — M79675 Pain in left toe(s): Secondary | ICD-10-CM | POA: Diagnosis not present

## 2020-05-27 DIAGNOSIS — M2042 Other hammer toe(s) (acquired), left foot: Secondary | ICD-10-CM

## 2020-05-27 DIAGNOSIS — D62 Acute posthemorrhagic anemia: Secondary | ICD-10-CM | POA: Diagnosis not present

## 2020-05-27 DIAGNOSIS — M2041 Other hammer toe(s) (acquired), right foot: Secondary | ICD-10-CM

## 2020-05-27 DIAGNOSIS — A0472 Enterocolitis due to Clostridium difficile, not specified as recurrent: Secondary | ICD-10-CM | POA: Insufficient documentation

## 2020-05-27 DIAGNOSIS — M79674 Pain in right toe(s): Secondary | ICD-10-CM

## 2020-05-27 DIAGNOSIS — J181 Lobar pneumonia, unspecified organism: Secondary | ICD-10-CM | POA: Diagnosis not present

## 2020-05-27 DIAGNOSIS — C182 Malignant neoplasm of ascending colon: Secondary | ICD-10-CM | POA: Diagnosis not present

## 2020-05-27 DIAGNOSIS — E785 Hyperlipidemia, unspecified: Secondary | ICD-10-CM | POA: Insufficient documentation

## 2020-05-28 DIAGNOSIS — H04123 Dry eye syndrome of bilateral lacrimal glands: Secondary | ICD-10-CM | POA: Diagnosis not present

## 2020-05-30 DIAGNOSIS — C182 Malignant neoplasm of ascending colon: Secondary | ICD-10-CM | POA: Diagnosis not present

## 2020-05-30 DIAGNOSIS — I2699 Other pulmonary embolism without acute cor pulmonale: Secondary | ICD-10-CM | POA: Diagnosis not present

## 2020-05-30 DIAGNOSIS — K229 Disease of esophagus, unspecified: Secondary | ICD-10-CM | POA: Diagnosis not present

## 2020-05-30 DIAGNOSIS — J181 Lobar pneumonia, unspecified organism: Secondary | ICD-10-CM | POA: Diagnosis not present

## 2020-05-30 DIAGNOSIS — E871 Hypo-osmolality and hyponatremia: Secondary | ICD-10-CM | POA: Diagnosis not present

## 2020-05-30 DIAGNOSIS — F419 Anxiety disorder, unspecified: Secondary | ICD-10-CM | POA: Diagnosis not present

## 2020-05-30 DIAGNOSIS — D62 Acute posthemorrhagic anemia: Secondary | ICD-10-CM | POA: Diagnosis not present

## 2020-05-30 DIAGNOSIS — K589 Irritable bowel syndrome without diarrhea: Secondary | ICD-10-CM | POA: Diagnosis not present

## 2020-05-30 DIAGNOSIS — E119 Type 2 diabetes mellitus without complications: Secondary | ICD-10-CM | POA: Diagnosis not present

## 2020-05-30 DIAGNOSIS — J9601 Acute respiratory failure with hypoxia: Secondary | ICD-10-CM | POA: Diagnosis not present

## 2020-05-30 DIAGNOSIS — S82202D Unspecified fracture of shaft of left tibia, subsequent encounter for closed fracture with routine healing: Secondary | ICD-10-CM | POA: Diagnosis not present

## 2020-05-30 DIAGNOSIS — I2510 Atherosclerotic heart disease of native coronary artery without angina pectoris: Secondary | ICD-10-CM | POA: Diagnosis not present

## 2020-05-30 DIAGNOSIS — Z483 Aftercare following surgery for neoplasm: Secondary | ICD-10-CM | POA: Diagnosis not present

## 2020-05-30 DIAGNOSIS — G40909 Epilepsy, unspecified, not intractable, without status epilepticus: Secondary | ICD-10-CM | POA: Diagnosis not present

## 2020-05-30 DIAGNOSIS — Z9181 History of falling: Secondary | ICD-10-CM | POA: Diagnosis not present

## 2020-05-30 DIAGNOSIS — Z794 Long term (current) use of insulin: Secondary | ICD-10-CM | POA: Diagnosis not present

## 2020-05-30 DIAGNOSIS — E039 Hypothyroidism, unspecified: Secondary | ICD-10-CM | POA: Diagnosis not present

## 2020-05-30 DIAGNOSIS — I10 Essential (primary) hypertension: Secondary | ICD-10-CM | POA: Diagnosis not present

## 2020-05-31 DIAGNOSIS — J9601 Acute respiratory failure with hypoxia: Secondary | ICD-10-CM | POA: Diagnosis not present

## 2020-05-31 DIAGNOSIS — C182 Malignant neoplasm of ascending colon: Secondary | ICD-10-CM | POA: Diagnosis not present

## 2020-05-31 DIAGNOSIS — D62 Acute posthemorrhagic anemia: Secondary | ICD-10-CM | POA: Diagnosis not present

## 2020-05-31 DIAGNOSIS — Z483 Aftercare following surgery for neoplasm: Secondary | ICD-10-CM | POA: Diagnosis not present

## 2020-05-31 DIAGNOSIS — J181 Lobar pneumonia, unspecified organism: Secondary | ICD-10-CM | POA: Diagnosis not present

## 2020-05-31 DIAGNOSIS — I2699 Other pulmonary embolism without acute cor pulmonale: Secondary | ICD-10-CM | POA: Diagnosis not present

## 2020-05-31 NOTE — Progress Notes (Signed)
Subjective: [name] presents today at risk foot care with history of diabetic neuropathy, at risk foot care. Patient has history of ulceration of right 4th digit, painful corn(s) b/l 2nd toes, callus(es) b/l feet and painful mycotic nails.  Pain interferes with ambulation. Aggravating factors include wearing enclosed shoe gear. She relates no new pedal concerns on today's visit.  Her caregiver is present during today's visit. Miss [name] has been hospitalized for a fall since her last visit with [name].   Bartholome Bill, MD is patient's PCP. Last visit was:12/05/2019.  Past Medical History:  Diagnosis Date  . Anemia   . Anxiety   . Asthma   . Breast cancer ([place]) 2014   right breast  . Cancer of right breast ([place]) 12/26/12   right breast 12:00 o'clock, DCIS  . Carotid artery disease ([place])   . Carpal tunnel syndrome, bilateral   . Chronic bronchitis ([place])   . Chronic cough   . Chronic facial pain   . Chronic foot pain   . Colon cancer ([place]) dx'd 11/2019  . Complication of anesthesia    Sore jaw; could not chew or move mouth, prolonged sedation  . Convulsions/seizures ([place]) 10/16/2014  . Diabetes mellitus    type 2 niddm x 20 years  . Dyslipidemia   . Ejection fraction   . Gait abnormality 12/04/2019  . GERD (gastroesophageal reflux disease)   . Hammer toe    bilateral  . History of colonic polyps   . History of meningioma   . HTN (hypertension)   . Hx of radiation therapy 03/07/13- 03/29/13   right breast 4250 cGy 17 sessions  . Hyperlipidemia   . Hypokalemia   . Hyponatremia   . Hypothyroidism   . IBS (irritable bowel syndrome)   . Melanoma ([place])   . Metatarsal bone fracture 2014  . Multiple drug allergies   . Nontoxic thyroid nodule   . Obesity   . Osteoarthritis   . Osteoporosis   . Palpitations   . Personal history of radiation therapy 2014  . Seasonal allergies   . Skin cancer   . Syncope   . Tremor, essential 08/18/2016  . Vitamin B12 deficiency   . Vitamin D  deficiency      Current Outpatient Medications on File Prior to Visit  Medication Sig Dispense Refill  . colesevelam (WELCHOL) 625 MG tablet 2 capsules    . ALPRAZolam (XANAX) 0.25 MG tablet TAKE 1 TABLET AT BEDTIME AS NEEDED (CAN TAKE 1 TABLET DURING THE DAY IF NEEDED FOR ANXIETY) (Patient taking differently: Take 0.25 mg by mouth at bedtime as needed. TAKE 1 TABLET AT BEDTIME AS NEEDED (CAN TAKE 1 TABLET DURING THE DAY IF NEEDED FOR ANXIETY)) 5 tablet 0  . Biotin 1000 MCG tablet 1 tablet    . Blood Glucose Monitoring Suppl (PRODIGY VOICE BLOOD GLUCOSE) w/Device KIT Use to check blood sugar 1 time per day. 1 each 2  . Cholecalciferol (VITAMIN D-3) 125 MCG (5000 UT) TABS Take 5,000 Units by mouth in the morning and at bedtime.    . Cholecalciferol (VITAMIN D-3) 5000 UNIT/ML LIQD 1 capsule    . cholestyramine (QUESTRAN) 4 g packet 1 packet mixed with water or non-carbonated drink    . cholestyramine light (PREVALITE) 4 g packet Take 2 g by mouth in the morning and at bedtime.     . Cranberry 425 MG CAPS Take 425 mg by mouth daily.    . cyanocobalamin (,VITAMIN B-12,) 1000 MCG/ML injection  INJECT 1 ML INTRAMUSCULARLY EVERY 21 DAYS (Patient taking differently: Inject 1,000 mcg into the muscle every 21 ( twenty-one) days.) 3 mL 2  . Cyanocobalamin 1000 MCG/ML KIT See admin instructions.    . diclofenac Sodium (VOLTAREN) 1 % GEL Apply 2 g topically 4 (four) times daily.    . diphenhydrAMINE (BENADRYL ALLERGY) 25 MG tablet 1 tablet    . docusate sodium (COLACE) 100 MG capsule Take 200 mg by mouth 2 (two) times daily as needed (for constipation.).     Marland Kitchen empagliflozin (JARDIANCE) 25 MG TABS tablet 1 tablet (10 mg)    . enoxaparin (LOVENOX) 60 MG/0.6ML injection Inject 60 mg into the skin every 12 (twelve) hours.    Marland Kitchen escitalopram (LEXAPRO) 10 MG tablet 1 tablet    . feeding supplement, GLUCERNA SHAKE, (GLUCERNA SHAKE) LIQD Take 237 mLs by mouth 2 (two) times daily.    . ferrous sulfate 325 (65 FE) MG  tablet 1 tablet    . fluconazole (DIFLUCAN) 150 MG tablet Take 150 mg by mouth daily.    . furosemide (LASIX) 20 MG tablet 1 tablet    . glucose blood (GLUCOSE METER TEST) test strip See admin instructions.    . Glucose Blood (PRODIGY VOICE BLOOD GLUCOSE VI)     . insulin aspart (NOVOLOG) 100 UNIT/ML FlexPen Inject 2-12 Units into the skin 3 (three) times daily with meals. Injects 2-10 units of insulin under the skin 3 times a day per sliding scale: CBG 0-150: 2 units; CBG 151-200: 4 units; CBG 201-250: 6 units; CBG 251-300: 8 units; CBG 301-350: 10 units; CBG 351-400: 12 units; CBG >400: 12 units and notify the MD/NP    . insulin glargine (LANTUS SOLOSTAR) 100 UNIT/ML Solostar Pen 14 units    . Insulin Pen Needle 32G X 4 MM MISC Used to inject insulin 3x daily 270 each 2  . levothyroxine (SYNTHROID) 75 MCG tablet 1 tablet on an empty stomach in the morning    . lidocaine (LIDODERM) 5 % Place 1 patch onto the skin daily. Remove & Discard patch within 12 hours or as directed by MD 90 patch 1  . ondansetron (ZOFRAN-ODT) 4 MG disintegrating tablet Take 1 tablet (4 mg total) by mouth every 6 (six) hours as needed for nausea. 20 tablet 0  . oxyCODONE (OXY IR/ROXICODONE) 5 MG immediate release tablet oxycodone 5 mg tablet  TAKE 1 TABLET (5 MG TOTAL) BY MOUTH EVERY 6 (SIX) HOURS AS NEEDED FOR UP TO 5 DAYS (SEVERE PAIN NOT CONTROLLED WITH TYLENOL FIRST.DO NOT TAKE WITH TRAMADOL)    . oxyCODONE-acetaminophen (PERCOCET) 5-325 MG tablet Take 1-2 tablets by mouth every 6 (six) hours as needed. 20 tablet 0  . phenytoin (DILANTIN) 100 MG ER capsule Take 1 capsule (100 mg) in the morning and Take 2 capsules (200 mg) at bedtime (Patient taking differently: Take 100-200 mg by mouth See admin instructions. Take 1 capsule (100 mg) in the morning & Take 2 capsules (200 mg) at bedtime) 270 capsule 3  . phenytoin (PHENYTOIN INFATABS) 50 MG tablet 150 in the Am and 200 in the Pm    . Polyethyl Glycol-Propyl Glycol  (SYSTANE) 0.4-0.3 % SOLN 2 drop in bilateral eyes    . primidone (MYSOLINE) 50 MG tablet Three tablets in the morning and 2 at dinner time 450 tablet 1  . primidone (MYSOLINE) 50 MG tablet 3 tablets    . Thiamine HCl (VITAMIN B-1) 250 MG tablet Take 250 mg by mouth at bedtime.    Marland Kitchen  traMADol (ULTRAM) 50 MG tablet 1 tablet    . trandolapril-verapamil (TARKA) 2-240 MG tablet Take 1 tablet by mouth at bedtime.    . trandolapril-verapamil (TARKA) 2-240 MG tablet 1 tablet     No current facility-administered medications on file prior to visit.     Allergies  Allergen Reactions  . Bystolic [Nebivolol Hcl] Other (See Comments)    "extreme weakness, heaviness in legs & arms, swelling in legs/arms/face, swollen abdomen, pain in bladder, feet pain, soreness in chest"  . Carbamazepine Other (See Comments)    Blood poisoning  Other reaction(s): Unknown  . Cholestyramine Other (See Comments)    "itching rash on stomach, bloated, nausea, vomiting, sleeplessness, extreme pain in arms"  . Hydrazine Yellow [Tartrazine] Other (See Comments)    "does not reduce high blood pressure, pain in arm, high pressure, felt like I was on verge of heart attack, really weak"  . Morphine Other (See Comments)    Feels morbid, weak, still in pain  . Niacin Palpitations    Fast heart beat Other reaction(s): Unknown  . Niaspan [Niacin Er] Palpitations and Other (See Comments)    "fast heart beat, high blood pressure"  . Norvasc [Amlodipine Besylate] Other (See Comments)    "extreme fluid retention/pain)  . Optivar [Azelastine Hcl] Photosensitivity  . Repaglinide Hives  . Sular [Nisoldipine Er] Other (See Comments)    "severe headaches, swelling eyes, hands, feet, shortness of breath, weak, flushed face, brain boiling, fluid retention, high blood sugar, nervous, heart fast beating"  . Telmisartan Other (See Comments)    "headache, difficulty urinating, high blood sugar, fluid retention"  . Amoxicillin-Pot  Clavulanate Rash and Other (See Comments)  . Cefdinir Swelling    Vaginal irritation, breathing,   . Ciprofloxacin Hcl Hives  . Clonidine Other (See Comments)    Dry mouth, fluid retention  . Clonidine Hydrochloride Other (See Comments)    Dry mouth, fluid retention  . Codeine Nausea And Vomiting  . Ezetimibe Other (See Comments)    Made weak Other reaction(s): Unknown  . Hydroxychloroquine Other (See Comments)    Low platlets  . Naproxen Other (See Comments)    Shrinks bladder  . Sulfa Antibiotics Rash  . Ziac [Bisoprolol-Hydrochlorothiazide] Other (See Comments)    "stopped urination"  . Amlodipine Besy-Benazepril Hcl     Other reaction(s): cough  . Azelastine Other (See Comments)    Unknown reaction - Per MAR  . Elavil [Amitriptyline] Other (See Comments)    Gave Pt nightmares  . Empagliflozin Other (See Comments)    "Caused yeast infection, slowed my urine"  . Hydralazine Other (See Comments)    Unknown reaction - Per MAR  . Iodine I 131 Tositumomab Other (See Comments)    Unknown reaction - Per MAR  . Kenalog [Triamcinolone] Diarrhea    Per MAR  . Keppra [Levetiracetam] Other (See Comments)    Shaking  . Lamotrigine Itching and Other (See Comments)  . Norvasc [Amlodipine] Other (See Comments)    Unknown reaction - Per MAR  . Other Other (See Comments)    Other reaction(s): Unknown Other reaction(s): Unknown Other reaction(s): Unknown Other reaction(s): Unknown Other reaction(s): Unknown  . Pregabalin Swelling    Other reaction(s): weight gain  . Pseudoephedrine   . Pseudoephedrine-Guaifenesin Er Other (See Comments)    Unknown reaction - Per MAR  . Risedronate     Unknown reaction - Per MAR  . Ru-Hist D [Brompheniramine-Phenylephrine] Other (See Comments)    Unknown reaction - Per MAR  .  Sumatriptan Other (See Comments)  . Topiramate Other (See Comments)    Dry eyes Other reaction(s): Unknown  . Ace Inhibitors Other (See Comments)    unknown  . Actonel  [Risedronate Sodium] Other (See Comments)    unknown  . Amlodipine Besylate Other (See Comments)    unknown  . Aspirin Other (See Comments)    unknown  . Atacand [Candesartan] Other (See Comments)    Unknown reaction - MAR  . Bextra [Valdecoxib] Other (See Comments)    Unknown reaction - Per MAR  . Bisoprolol-Hydrochlorothiazide Other (See Comments)    Unknown reaction - Per MAR  . Candesartan Cilexetil Other (See Comments)    unknown  . Cefadroxil Other (See Comments)    unknown  . Celecoxib Rash  . Hydrocodone Other (See Comments)    unknown  . Hydrocodone-Acetaminophen Other (See Comments)    unknown  . Iodinated Diagnostic Agents Rash    "All over"  Other reaction(s): Unknown  . Meloxicam Other (See Comments)    unknown  . Methylprednisolone Sodium Succinate Other (See Comments)    unknown  . Nabumetone Other (See Comments)    Unknown reaction  . Penicillins Other (See Comments)    unknown  . Pseudoephedrine-Guaifenesin Other (See Comments)    unknown  . Risedronate Sodium Other (See Comments)    unknown  . Rofecoxib Other (See Comments)    Unknown reaction - MAR Other reaction(s): Unknown  . Ru-Tuss [Chlorphen-Pse-Atrop-Hyos-Scop] Other (See Comments)    unknown  . Sulfonamide Derivatives Other (See Comments)    unknown  . Sulphur [Elemental Sulfur] Other (See Comments)    unknown  . Telithromycin Other (See Comments)    unknown  . Terfenadine Other (See Comments)    Unknown reaction - Per MAR  . Trandolapril-Verapamil Hcl Er Other (See Comments)    Headache, difficulty urinating, high blood sugar, fluid retention  Pt is taking Tarka (trandolapril-verapamil) currently, but requests the medication stay in her allergy list  . Trandolapril-Verapamil Hcl Er Other (See Comments)    Headache, difficulty urinating, high blood sugar, fluid retention  Pt is taking Tarka (trandolapril-verapamil) currently, but requests the medication stay in her allergy list  .  Valium [Diazepam] Other (See Comments)    Makes her mean and hyper    Objective: Ardella Chhim Oshana is a pleasant 87 y.o. Caucasian female, WD, WN in NAD.  AAO x 3.   There were no vitals filed for this visit.   Vascular Examination: Neurovascular status unchanged b/l. Capillary fill time to digits <3 seconds b/l. Faintly palpable pedal pulses b/l. Pedal hair absent b/l Skin temperature gradient within normal limits b/l. No edema noted b/l.  Dermatological Examination: Pedal skin with normal turgor, texture and tone bilaterally. No open wounds bilaterally. No interdigital macerations bilaterally. Toenails 1-5 b/l elongated, discolored, dystrophic, thickened, crumbly with subungual debris and tenderness to dorsal palpation. Hyperkeratotic lesion(s) L 2nd toe, R 2nd toe, R 4th toe, submet head 1 left foot and submet head 1 right foot.  No erythema, no edema, no drainage, no flocculence.  Musculoskeletal: Normal muscle strength 5/5 to all lower extremity muscle groups bilaterally. No gross bony deformities bilaterally. No pain crepitus or joint limitation noted with ROM b/l. Hammertoes noted to the 2-5 bilaterally.  Neurological Examination: Protective sensation diminished with 10g monofilament b/l.   Hemoglobin A1C Latest Ref Rng & Units 03/04/2020 01/01/2020  HGBA1C 4.8 - 5.6 % 8.6(H) 7.3(H)  Some recent data might be hidden   Assessment: 1. Pain  due to onychomycosis of toenails of both feet   2. Corns and callosities   3. Acquired hammertoes of both feet   4. Diabetic peripheral neuropathy associated with type 2 diabetes mellitus ([place])    Plan: -Examined patient. -Continue diabetic foot care principles. -Toenails 1-5 b/l were debrided in length and girth with sterile nail nippers and dremel without iatrogenic bleeding.  -Corn(s) L 2nd toe, R 2nd toe and R 4th toe pared utilizing sterile scalpel blade without complication or incident. Total number debrided=3. -Callus(es) submet head 1 left  foot and submet head 1 right foot pared utilizing sterile scalpel blade without complication or incident. Total number debrided =2. -Patient to report any pedal injuries to medical professional immediately. -Patient to continue soft, supportive shoe gear daily. -Patient/POA to call should there be question/concern in the interim.  Return in about 3 months (around 08/25/2020).  Marzetta Board, DPM

## 2020-06-03 ENCOUNTER — Ambulatory Visit
Admission: RE | Admit: 2020-06-03 | Discharge: 2020-06-03 | Disposition: A | Discharge status: Home / Self Care | Payer: Medicare Other | Source: Ambulatory Visit | Attending: Family Medicine | Admitting: Family Medicine

## 2020-06-03 DIAGNOSIS — E041 Nontoxic single thyroid nodule: Secondary | ICD-10-CM

## 2020-06-04 ENCOUNTER — Inpatient Hospital Stay (HOSPITAL_COMMUNITY): Payer: Medicare Other

## 2020-06-04 ENCOUNTER — Emergency Department (HOSPITAL_COMMUNITY): Payer: Medicare Other

## 2020-06-04 ENCOUNTER — Encounter (HOSPITAL_COMMUNITY): Payer: Self-pay

## 2020-06-04 ENCOUNTER — Inpatient Hospital Stay (HOSPITAL_COMMUNITY)
Admission: EM | Admit: 2020-06-04 | Discharge: 2020-06-11 | DRG: 522 | Disposition: A | Discharge status: Skilled Nursing Facility | Payer: Medicare Other | Attending: Internal Medicine | Admitting: Internal Medicine

## 2020-06-04 DIAGNOSIS — Y92009 Unspecified place in unspecified non-institutional (private) residence as the place of occurrence of the external cause: Secondary | ICD-10-CM | POA: Diagnosis not present

## 2020-06-04 DIAGNOSIS — D62 Acute posthemorrhagic anemia: Secondary | ICD-10-CM | POA: Diagnosis not present

## 2020-06-04 DIAGNOSIS — Z886 Allergy status to analgesic agent status: Secondary | ICD-10-CM

## 2020-06-04 DIAGNOSIS — Z888 Allergy status to other drugs, medicaments and biological substances status: Secondary | ICD-10-CM

## 2020-06-04 DIAGNOSIS — D72829 Elevated white blood cell count, unspecified: Secondary | ICD-10-CM | POA: Diagnosis not present

## 2020-06-04 DIAGNOSIS — Z043 Encounter for examination and observation following other accident: Secondary | ICD-10-CM | POA: Diagnosis not present

## 2020-06-04 DIAGNOSIS — Z85820 Personal history of malignant melanoma of skin: Secondary | ICD-10-CM

## 2020-06-04 DIAGNOSIS — Z9889 Other specified postprocedural states: Secondary | ICD-10-CM | POA: Diagnosis not present

## 2020-06-04 DIAGNOSIS — S72001D Fracture of unspecified part of neck of right femur, subsequent encounter for closed fracture with routine healing: Secondary | ICD-10-CM | POA: Diagnosis not present

## 2020-06-04 DIAGNOSIS — J302 Other seasonal allergic rhinitis: Secondary | ICD-10-CM | POA: Diagnosis present

## 2020-06-04 DIAGNOSIS — J42 Unspecified chronic bronchitis: Secondary | ICD-10-CM | POA: Diagnosis not present

## 2020-06-04 DIAGNOSIS — W1830XA Fall on same level, unspecified, initial encounter: Secondary | ICD-10-CM | POA: Diagnosis present

## 2020-06-04 DIAGNOSIS — S72001A Fracture of unspecified part of neck of right femur, initial encounter for closed fracture: Secondary | ICD-10-CM

## 2020-06-04 DIAGNOSIS — Z833 Family history of diabetes mellitus: Secondary | ICD-10-CM

## 2020-06-04 DIAGNOSIS — S72001P Fracture of unspecified part of neck of right femur, subsequent encounter for closed fracture with malunion: Secondary | ICD-10-CM | POA: Diagnosis not present

## 2020-06-04 DIAGNOSIS — W19XXXA Unspecified fall, initial encounter: Secondary | ICD-10-CM | POA: Diagnosis not present

## 2020-06-04 DIAGNOSIS — E785 Hyperlipidemia, unspecified: Secondary | ICD-10-CM | POA: Diagnosis not present

## 2020-06-04 DIAGNOSIS — E119 Type 2 diabetes mellitus without complications: Secondary | ICD-10-CM | POA: Diagnosis not present

## 2020-06-04 DIAGNOSIS — G8929 Other chronic pain: Secondary | ICD-10-CM | POA: Diagnosis present

## 2020-06-04 DIAGNOSIS — Z01818 Encounter for other preprocedural examination: Secondary | ICD-10-CM | POA: Diagnosis not present

## 2020-06-04 DIAGNOSIS — E871 Hypo-osmolality and hyponatremia: Secondary | ICD-10-CM | POA: Diagnosis not present

## 2020-06-04 DIAGNOSIS — R569 Unspecified convulsions: Secondary | ICD-10-CM | POA: Diagnosis present

## 2020-06-04 DIAGNOSIS — R519 Headache, unspecified: Secondary | ICD-10-CM | POA: Diagnosis not present

## 2020-06-04 DIAGNOSIS — K589 Irritable bowel syndrome without diarrhea: Secondary | ICD-10-CM | POA: Diagnosis present

## 2020-06-04 DIAGNOSIS — E1165 Type 2 diabetes mellitus with hyperglycemia: Secondary | ICD-10-CM

## 2020-06-04 DIAGNOSIS — E039 Hypothyroidism, unspecified: Secondary | ICD-10-CM

## 2020-06-04 DIAGNOSIS — M2550 Pain in unspecified joint: Secondary | ICD-10-CM | POA: Diagnosis not present

## 2020-06-04 DIAGNOSIS — Z86011 Personal history of benign neoplasm of the brain: Secondary | ICD-10-CM

## 2020-06-04 DIAGNOSIS — Z20822 Contact with and (suspected) exposure to covid-19: Secondary | ICD-10-CM | POA: Diagnosis not present

## 2020-06-04 DIAGNOSIS — M1990 Unspecified osteoarthritis, unspecified site: Secondary | ICD-10-CM | POA: Diagnosis present

## 2020-06-04 DIAGNOSIS — E538 Deficiency of other specified B group vitamins: Secondary | ICD-10-CM | POA: Diagnosis not present

## 2020-06-04 DIAGNOSIS — R2681 Unsteadiness on feet: Secondary | ICD-10-CM | POA: Diagnosis not present

## 2020-06-04 DIAGNOSIS — Z9049 Acquired absence of other specified parts of digestive tract: Secondary | ICD-10-CM

## 2020-06-04 DIAGNOSIS — M810 Age-related osteoporosis without current pathological fracture: Secondary | ICD-10-CM | POA: Diagnosis present

## 2020-06-04 DIAGNOSIS — Z86010 Personal history of colonic polyps: Secondary | ICD-10-CM

## 2020-06-04 DIAGNOSIS — Z961 Presence of intraocular lens: Secondary | ICD-10-CM | POA: Diagnosis present

## 2020-06-04 DIAGNOSIS — S72011A Unspecified intracapsular fracture of right femur, initial encounter for closed fracture: Principal | ICD-10-CM | POA: Diagnosis present

## 2020-06-04 DIAGNOSIS — G250 Essential tremor: Secondary | ICD-10-CM | POA: Diagnosis not present

## 2020-06-04 DIAGNOSIS — Z79899 Other long term (current) drug therapy: Secondary | ICD-10-CM

## 2020-06-04 DIAGNOSIS — M25572 Pain in left ankle and joints of left foot: Secondary | ICD-10-CM | POA: Diagnosis not present

## 2020-06-04 DIAGNOSIS — J45909 Unspecified asthma, uncomplicated: Secondary | ICD-10-CM | POA: Diagnosis present

## 2020-06-04 DIAGNOSIS — K219 Gastro-esophageal reflux disease without esophagitis: Secondary | ICD-10-CM

## 2020-06-04 DIAGNOSIS — Z882 Allergy status to sulfonamides status: Secondary | ICD-10-CM

## 2020-06-04 DIAGNOSIS — I10 Essential (primary) hypertension: Secondary | ICD-10-CM

## 2020-06-04 DIAGNOSIS — Z794 Long term (current) use of insulin: Secondary | ICD-10-CM | POA: Diagnosis not present

## 2020-06-04 DIAGNOSIS — Z853 Personal history of malignant neoplasm of breast: Secondary | ICD-10-CM

## 2020-06-04 DIAGNOSIS — Z7984 Long term (current) use of oral hypoglycemic drugs: Secondary | ICD-10-CM

## 2020-06-04 DIAGNOSIS — Z96641 Presence of right artificial hip joint: Secondary | ICD-10-CM | POA: Diagnosis not present

## 2020-06-04 DIAGNOSIS — Z923 Personal history of irradiation: Secondary | ICD-10-CM

## 2020-06-04 DIAGNOSIS — F32A Depression, unspecified: Secondary | ICD-10-CM | POA: Diagnosis present

## 2020-06-04 DIAGNOSIS — G40909 Epilepsy, unspecified, not intractable, without status epilepticus: Secondary | ICD-10-CM

## 2020-06-04 DIAGNOSIS — Z8262 Family history of osteoporosis: Secondary | ICD-10-CM

## 2020-06-04 DIAGNOSIS — Z79890 Hormone replacement therapy (postmenopausal): Secondary | ICD-10-CM

## 2020-06-04 DIAGNOSIS — Z419 Encounter for procedure for purposes other than remedying health state, unspecified: Secondary | ICD-10-CM

## 2020-06-04 DIAGNOSIS — M25551 Pain in right hip: Secondary | ICD-10-CM | POA: Diagnosis not present

## 2020-06-04 DIAGNOSIS — J069 Acute upper respiratory infection, unspecified: Secondary | ICD-10-CM | POA: Diagnosis not present

## 2020-06-04 DIAGNOSIS — Z7401 Bed confinement status: Secondary | ICD-10-CM | POA: Diagnosis not present

## 2020-06-04 DIAGNOSIS — M6281 Muscle weakness (generalized): Secondary | ICD-10-CM | POA: Diagnosis not present

## 2020-06-04 DIAGNOSIS — Z800 Family history of malignant neoplasm of digestive organs: Secondary | ICD-10-CM

## 2020-06-04 DIAGNOSIS — Z880 Allergy status to penicillin: Secondary | ICD-10-CM

## 2020-06-04 DIAGNOSIS — Z87898 Personal history of other specified conditions: Secondary | ICD-10-CM | POA: Diagnosis not present

## 2020-06-04 DIAGNOSIS — R52 Pain, unspecified: Secondary | ICD-10-CM | POA: Diagnosis not present

## 2020-06-04 DIAGNOSIS — Z9841 Cataract extraction status, right eye: Secondary | ICD-10-CM

## 2020-06-04 DIAGNOSIS — Z85038 Personal history of other malignant neoplasm of large intestine: Secondary | ICD-10-CM

## 2020-06-04 DIAGNOSIS — E559 Vitamin D deficiency, unspecified: Secondary | ICD-10-CM | POA: Diagnosis present

## 2020-06-04 DIAGNOSIS — R41841 Cognitive communication deficit: Secondary | ICD-10-CM | POA: Diagnosis not present

## 2020-06-04 DIAGNOSIS — Z881 Allergy status to other antibiotic agents status: Secondary | ICD-10-CM

## 2020-06-04 DIAGNOSIS — Z90710 Acquired absence of both cervix and uterus: Secondary | ICD-10-CM

## 2020-06-04 DIAGNOSIS — F419 Anxiety disorder, unspecified: Secondary | ICD-10-CM | POA: Diagnosis not present

## 2020-06-04 DIAGNOSIS — Z471 Aftercare following joint replacement surgery: Secondary | ICD-10-CM | POA: Diagnosis not present

## 2020-06-04 DIAGNOSIS — E876 Hypokalemia: Secondary | ICD-10-CM | POA: Diagnosis not present

## 2020-06-04 DIAGNOSIS — Z885 Allergy status to narcotic agent status: Secondary | ICD-10-CM

## 2020-06-04 DIAGNOSIS — Z9102 Food additives allergy status: Secondary | ICD-10-CM

## 2020-06-04 DIAGNOSIS — E861 Hypovolemia: Secondary | ICD-10-CM | POA: Diagnosis not present

## 2020-06-04 DIAGNOSIS — R457 State of emotional shock and stress, unspecified: Secondary | ICD-10-CM | POA: Diagnosis not present

## 2020-06-04 LAB — CBC WITH DIFFERENTIAL/PLATELET
Abs Immature Granulocytes: 0.11 10*3/uL — ABNORMAL HIGH (ref 0.00–0.07)
Basophils Absolute: 0 10*3/uL (ref 0.0–0.1)
Basophils Relative: 0 %
Eosinophils Absolute: 0 10*3/uL (ref 0.0–0.5)
Eosinophils Relative: 0 %
HCT: 36.1 % (ref 36.0–46.0)
Hemoglobin: 11.9 g/dL — ABNORMAL LOW (ref 12.0–15.0)
Immature Granulocytes: 1 %
Lymphocytes Relative: 7 %
Lymphs Abs: 1.1 10*3/uL (ref 0.7–4.0)
MCH: 30.4 pg (ref 26.0–34.0)
MCHC: 33 g/dL (ref 30.0–36.0)
MCV: 92.1 fL (ref 80.0–100.0)
Monocytes Absolute: 0.6 10*3/uL (ref 0.1–1.0)
Monocytes Relative: 4 %
Neutro Abs: 14.4 10*3/uL — ABNORMAL HIGH (ref 1.7–7.7)
Neutrophils Relative %: 88 %
Platelets: 249 10*3/uL (ref 150–400)
RBC: 3.92 MIL/uL (ref 3.87–5.11)
RDW: 16.4 % — ABNORMAL HIGH (ref 11.5–15.5)
WBC: 16.1 10*3/uL — ABNORMAL HIGH (ref 4.0–10.5)
nRBC: 0 % (ref 0.0–0.2)

## 2020-06-04 LAB — URINALYSIS, ROUTINE W REFLEX MICROSCOPIC
Bilirubin Urine: NEGATIVE
Glucose, UA: NEGATIVE mg/dL
Hgb urine dipstick: NEGATIVE
Ketones, ur: NEGATIVE mg/dL
Leukocytes,Ua: NEGATIVE
Nitrite: NEGATIVE
Protein, ur: NEGATIVE mg/dL
Specific Gravity, Urine: 1.01 (ref 1.005–1.030)
pH: 5 (ref 5.0–8.0)

## 2020-06-04 LAB — BASIC METABOLIC PANEL
Anion gap: 12 (ref 5–15)
BUN: 14 mg/dL (ref 8–23)
CO2: 21 mmol/L — ABNORMAL LOW (ref 22–32)
Calcium: 8.9 mg/dL (ref 8.9–10.3)
Chloride: 105 mmol/L (ref 98–111)
Creatinine, Ser: 0.83 mg/dL (ref 0.44–1.00)
GFR, Estimated: >60 mL/min (ref >60)
Glucose, Bld: 135 mg/dL — ABNORMAL HIGH (ref 70–99)
Potassium: 4.6 mmol/L (ref 3.5–5.1)
Sodium: 138 mmol/L (ref 135–145)

## 2020-06-04 LAB — CBC
HCT: 35 % — ABNORMAL LOW (ref 36.0–46.0)
Hemoglobin: 11.5 g/dL — ABNORMAL LOW (ref 12.0–15.0)
MCH: 30.3 pg (ref 26.0–34.0)
MCHC: 32.9 g/dL (ref 30.0–36.0)
MCV: 92.1 fL (ref 80.0–100.0)
Platelets: 246 10*3/uL (ref 150–400)
RBC: 3.8 MIL/uL — ABNORMAL LOW (ref 3.87–5.11)
RDW: 16.4 % — ABNORMAL HIGH (ref 11.5–15.5)
WBC: 12.4 10*3/uL — ABNORMAL HIGH (ref 4.0–10.5)
nRBC: 0 % (ref 0.0–0.2)

## 2020-06-04 LAB — VITAMIN B12: Vitamin B-12: 2750 pg/mL — ABNORMAL HIGH (ref 180–914)

## 2020-06-04 LAB — CREATININE, SERUM
Creatinine, Ser: 0.66 mg/dL (ref 0.44–1.00)
GFR, Estimated: >60 mL/min (ref >60)

## 2020-06-04 LAB — CBG MONITORING, ED: Glucose-Capillary: 122 mg/dL — ABNORMAL HIGH (ref 70–99)

## 2020-06-04 LAB — GLUCOSE, CAPILLARY: Glucose-Capillary: 97 mg/dL (ref 70–99)

## 2020-06-04 LAB — PROTIME-INR
INR: 1.1 (ref 0.8–1.2)
Prothrombin Time: 13.7 seconds (ref 11.4–15.2)

## 2020-06-04 MED ORDER — LEVOTHYROXINE SODIUM 75 MCG PO TABS
75.0000 ug | ORAL_TABLET | Freq: Every day | ORAL | Status: DC
  Administered 2020-06-06 – 2020-06-10 (×5): 75 ug via ORAL
  Filled 2020-06-04 (×5): qty 1

## 2020-06-04 MED ORDER — HEPARIN SODIUM (PORCINE) 5000 UNIT/ML IJ SOLN
5000.0000 [IU] | Freq: Three times a day (TID) | INTRAMUSCULAR | Status: DC
  Administered 2020-06-04 (×2): 5000 [IU] via SUBCUTANEOUS
  Filled 2020-06-04 (×2): qty 1

## 2020-06-04 MED ORDER — ALPRAZOLAM 0.25 MG PO TABS
0.2500 mg | ORAL_TABLET | Freq: Two times a day (BID) | ORAL | Status: DC | PRN
  Administered 2020-06-04 – 2020-06-10 (×5): 0.25 mg via ORAL
  Filled 2020-06-04 (×6): qty 1

## 2020-06-04 MED ORDER — GLUCERNA SHAKE PO LIQD
237.0000 mL | Freq: Two times a day (BID) | ORAL | Status: DC
  Administered 2020-06-05 – 2020-06-10 (×9): 237 mL via ORAL
  Filled 2020-06-04 (×13): qty 237

## 2020-06-04 MED ORDER — INSULIN DETEMIR 100 UNIT/ML ~~LOC~~ SOLN
6.0000 [IU] | Freq: Every day | SUBCUTANEOUS | Status: DC
  Administered 2020-06-04: 6 [IU] via SUBCUTANEOUS
  Filled 2020-06-04: qty 0.06

## 2020-06-04 MED ORDER — PRIMIDONE 50 MG PO TABS
100.0000 mg | ORAL_TABLET | Freq: Every day | ORAL | Status: DC
  Administered 2020-06-04 – 2020-06-10 (×7): 100 mg via ORAL
  Filled 2020-06-04 (×7): qty 2

## 2020-06-04 MED ORDER — ONDANSETRON HCL 4 MG/2ML IJ SOLN: 4.0000 mg | Freq: Four times a day (QID) | INTRAMUSCULAR | Status: DC | PRN

## 2020-06-04 MED ORDER — ESCITALOPRAM OXALATE 10 MG PO TABS
10.0000 mg | ORAL_TABLET | Freq: Every day | ORAL | Status: DC
Start: 2020-06-04 — End: 2020-06-08
  Administered 2020-06-05 – 2020-06-07 (×3): 10 mg via ORAL
  Filled 2020-06-04 (×3): qty 1

## 2020-06-04 MED ORDER — CHOLESTYRAMINE LIGHT 4 G PO PACK
2.0000 g | PACK | Freq: Two times a day (BID) | ORAL | Status: DC
  Administered 2020-06-04 – 2020-06-10 (×9): 2 g via ORAL
  Filled 2020-06-04 (×13): qty 1

## 2020-06-04 MED ORDER — INSULIN ASPART 100 UNIT/ML ~~LOC~~ SOLN
0.0000 [IU] | Freq: Every day | SUBCUTANEOUS | Status: DC
  Administered 2020-06-08: 4 [IU] via SUBCUTANEOUS
  Administered 2020-06-09: 3 [IU] via SUBCUTANEOUS
  Filled 2020-06-04: qty 0.05

## 2020-06-04 MED ORDER — TRANDOLAPRIL-VERAPAMIL HCL ER 2-240 MG PO TBCR: 1.0000 | EXTENDED_RELEASE_TABLET | Freq: Every day | ORAL | Status: DC

## 2020-06-04 MED ORDER — VANCOMYCIN HCL IN DEXTROSE 1-5 GM/200ML-% IV SOLN
1000.0000 mg | INTRAVENOUS | Status: AC
  Administered 2020-06-05: 1000 mg via INTRAVENOUS
  Filled 2020-06-04: qty 200

## 2020-06-04 MED ORDER — PRIMIDONE 50 MG PO TABS: 100.0000 mg | ORAL_TABLET | Freq: Two times a day (BID) | ORAL | Status: DC

## 2020-06-04 MED ORDER — PHENYTOIN SODIUM EXTENDED 100 MG PO CAPS: 100.0000 mg | ORAL_CAPSULE | ORAL | Status: DC

## 2020-06-04 MED ORDER — SODIUM CHLORIDE 0.9 % IV SOLN: INTRAVENOUS | Status: DC

## 2020-06-04 MED ORDER — PHENYTOIN SODIUM EXTENDED 100 MG PO CAPS
200.0000 mg | ORAL_CAPSULE | Freq: Every day | ORAL | Status: DC
  Administered 2020-06-04 – 2020-06-10 (×7): 200 mg via ORAL
  Filled 2020-06-04 (×7): qty 2

## 2020-06-04 MED ORDER — CHLORHEXIDINE GLUCONATE 4 % EX LIQD
60.0000 mL | Freq: Once | CUTANEOUS | Status: AC
  Administered 2020-06-04: 4 via TOPICAL
  Filled 2020-06-04: qty 15

## 2020-06-04 MED ORDER — ACETAMINOPHEN 325 MG PO TABS: 650.0000 mg | ORAL_TABLET | Freq: Four times a day (QID) | ORAL | Status: DC | PRN

## 2020-06-04 MED ORDER — TRANDOLAPRIL 2 MG PO TABS
2.0000 mg | ORAL_TABLET | Freq: Every day | ORAL | Status: DC
  Administered 2020-06-04 – 2020-06-05 (×2): 2 mg via ORAL
  Filled 2020-06-04 (×2): qty 1

## 2020-06-04 MED ORDER — PHENYTOIN SODIUM EXTENDED 100 MG PO CAPS
100.0000 mg | ORAL_CAPSULE | Freq: Every morning | ORAL | Status: DC
  Administered 2020-06-05 – 2020-06-10 (×6): 100 mg via ORAL
  Filled 2020-06-04 (×6): qty 1

## 2020-06-04 MED ORDER — HYDROMORPHONE HCL 1 MG/ML IJ SOLN
0.5000 mg | Freq: Once | INTRAMUSCULAR | Status: AC
  Administered 2020-06-04: 0.5 mg via INTRAVENOUS
  Filled 2020-06-04: qty 1

## 2020-06-04 MED ORDER — VERAPAMIL HCL ER 240 MG PO TBCR
240.0000 mg | EXTENDED_RELEASE_TABLET | Freq: Every day | ORAL | Status: DC
  Administered 2020-06-04 – 2020-06-05 (×2): 240 mg via ORAL
  Filled 2020-06-04 (×2): qty 1

## 2020-06-04 MED ORDER — THIAMINE HCL 100 MG PO TABS
250.0000 mg | ORAL_TABLET | Freq: Every day | ORAL | Status: DC
  Administered 2020-06-04 – 2020-06-10 (×7): 250 mg via ORAL
  Filled 2020-06-04 (×7): qty 3

## 2020-06-04 MED ORDER — HYDROMORPHONE HCL 1 MG/ML IJ SOLN
0.5000 mg | INTRAMUSCULAR | Status: AC | PRN
  Administered 2020-06-04 (×3): 0.5 mg via INTRAVENOUS
  Filled 2020-06-04 (×3): qty 1

## 2020-06-04 MED ORDER — OXYCODONE HCL 5 MG PO TABS
5.0000 mg | ORAL_TABLET | Freq: Four times a day (QID) | ORAL | Status: DC | PRN
Start: 2020-06-04 — End: 2020-06-07
  Administered 2020-06-04 – 2020-06-07 (×5): 5 mg via ORAL
  Filled 2020-06-04 (×5): qty 1

## 2020-06-04 MED ORDER — POLYVINYL ALCOHOL 1.4 % OP SOLN
2.0000 [drp] | Freq: Every day | OPHTHALMIC | Status: DC
  Administered 2020-06-05 – 2020-06-10 (×6): 2 [drp] via OPHTHALMIC
  Filled 2020-06-04: qty 15

## 2020-06-04 MED ORDER — HYDROMORPHONE HCL 1 MG/ML IJ SOLN
0.5000 mg | INTRAMUSCULAR | Status: DC | PRN
  Administered 2020-06-04 – 2020-06-09 (×17): 0.5 mg via INTRAVENOUS
  Filled 2020-06-04 (×19): qty 0.5

## 2020-06-04 MED ORDER — METHOCARBAMOL 500 MG PO TABS
500.0000 mg | ORAL_TABLET | Freq: Three times a day (TID) | ORAL | Status: DC | PRN
  Administered 2020-06-04 – 2020-06-10 (×8): 500 mg via ORAL
  Filled 2020-06-04 (×7): qty 1

## 2020-06-04 MED ORDER — LACTATED RINGERS IV BOLUS
1000.0000 mL | Freq: Once | INTRAVENOUS | Status: AC
  Administered 2020-06-04: 1000 mL via INTRAVENOUS

## 2020-06-04 MED ORDER — POLYETHYLENE GLYCOL 3350 17 G PO PACK: 17.0000 g | PACK | Freq: Every day | ORAL | Status: DC | PRN

## 2020-06-04 MED ORDER — PRIMIDONE 50 MG PO TABS
150.0000 mg | ORAL_TABLET | Freq: Every morning | ORAL | Status: DC
  Administered 2020-06-05 – 2020-06-10 (×6): 150 mg via ORAL
  Filled 2020-06-04 (×6): qty 3

## 2020-06-04 MED ORDER — INSULIN ASPART 100 UNIT/ML ~~LOC~~ SOLN
0.0000 [IU] | Freq: Three times a day (TID) | SUBCUTANEOUS | Status: DC
  Administered 2020-06-04: 1 [IU] via SUBCUTANEOUS
  Administered 2020-06-07: 2 [IU] via SUBCUTANEOUS
  Administered 2020-06-10: 7 [IU] via SUBCUTANEOUS
  Administered 2020-06-10: 3 [IU] via SUBCUTANEOUS
  Filled 2020-06-04: qty 0.09

## 2020-06-04 MED ORDER — METHOCARBAMOL 500 MG PO TABS
500.0000 mg | ORAL_TABLET | Freq: Once | ORAL | Status: DC
  Filled 2020-06-04: qty 1

## 2020-06-04 NOTE — Progress Notes (Signed)
Patient ID: [name], female   DOB: 1929-07-20, 87 y.o.   MRN: [id number]  Received consult for right femoral neck fracture. Will plan on hip hemi vs THA tomorrow with Dr. Lyla Glassing. Full consult to follow tomorrow. Rau give diet today, please keep NPO after MN.    Lisette Abu, PA-C Orthopedic Surgery [phone]

## 2020-06-04 NOTE — ED Triage Notes (Signed)
Transported by [facility] from home-- unwitnessed fall injuring right hip; patient tripped over her walker denies LOC or hitting head. Denies head/neck/back pain. AAO x 4. EMS administered 100 mcg of Fentantyl PTA.

## 2020-06-04 NOTE — H&P (Signed)
History and Physical    [name] UMP:[id number] DOB: 1929-11-21 DOA: 06/04/2020  PCP: Bartholome Bill, MD   Patient coming from: Home  I have personally briefly reviewed patient's old medical records in [place]  Chief Complaint: Mechanical fall and right hip pain.  HPI: [name] is a 87 y.o. female with medical history significant of hypertension, hyperlipidemia, history of benign meningioma (status post resection), osteoarthritis, B12 deficiency, vitamin D deficiency, history of right breast cancer (status post lumpectomy and radiation), seizure, anxiety and insulin-dependent diabetes; who presented to the hospital secondary to bone weakness mechanical fall at home with subsequent severe right hip pain and inability to bear weight.  Patient reports no loss of consciousness, no chest pain, no nausea, no vomiting, no shortness of breath, no fever, no chills, no focal weakness, no sick contacts, no dysuria, no hematuria, or any other complaints.  ED Course: Elevated WBCs appreciated on her blood work; CT head demonstrating no acute abnormalities.  X-ray of the hip confirming moderate displaced right femoral neck fracture.  Orthopedic service was consulted and the plan is for surgical repair on 06/05/2020; [facility] has been contacted to facilitate admission and further evaluation and management.  Review of Systems: As per HPI otherwise all other systems reviewed and are negative.   Past Medical History:  Diagnosis Date  . Anemia   . Anxiety   . Asthma   . Breast cancer ([place]) 2014   right breast  . Cancer of right breast ([place]) 12/26/12   right breast 12:00 o'clock, DCIS  . Carotid artery disease ([place])   . Carpal tunnel syndrome, bilateral   . Chronic bronchitis ([place])   . Chronic cough   . Chronic facial pain   . Chronic foot pain   . Colon cancer ([place]) dx'd 11/2019  . Complication of anesthesia    Sore jaw; could not chew or move mouth, prolonged sedation  .  Convulsions/seizures ([place]) 10/16/2014  . Diabetes mellitus    type 2 niddm x 20 years  . Dyslipidemia   . Ejection fraction   . Gait abnormality 12/04/2019  . GERD (gastroesophageal reflux disease)   . Hammer toe    bilateral  . History of colonic polyps   . History of meningioma   . HTN (hypertension)   . Hx of radiation therapy 03/07/13- 03/29/13   right breast 4250 cGy 17 sessions  . Hyperlipidemia   . Hypokalemia   . Hyponatremia   . Hypothyroidism   . IBS (irritable bowel syndrome)   . Melanoma ([place])   . Metatarsal bone fracture 2014  . Multiple drug allergies   . Nontoxic thyroid nodule   . Obesity   . Osteoarthritis   . Osteoporosis   . Palpitations   . Personal history of radiation therapy 2014  . Seasonal allergies   . Skin cancer   . Syncope   . Tremor, essential 08/18/2016  . Vitamin B12 deficiency   . Vitamin D deficiency     Past Surgical History:  Procedure Laterality Date  . ABDOMINAL HYSTERECTOMY    . BIOPSY  11/07/2019   Procedure: BIOPSY;  Surgeon: Ronald Lobo, MD;  Location: [facility];  Service: Endoscopy;;  . BRAIN SURGERY    . BREAST BIOPSY Right 01/24/2013   Procedure: RE-EXCICION OF BREAST CANCER, ANTERIOR MARGINS;  Surgeon: Edward Jolly, MD;  Location: [facility];  Service: General;  Laterality: Right;  . BREAST LUMPECTOMY Right 2014  . BREAST LUMPECTOMY WITH NEEDLE  LOCALIZATION Right 01/17/2013   Procedure: BREAST LUMPECTOMY WITH NEEDLE LOCALIZATION;  Surgeon: Edward Jolly, MD;  Location: [facility];  Service: General;  Laterality: Right;  . BUNIONECTOMY Bilateral   . CATARACT EXTRACTION W/ INTRAOCULAR LENS IMPLANT Right   . CHOLECYSTECTOMY    . COLONOSCOPY    . COLONOSCOPY WITH PROPOFOL N/A 11/07/2019   Procedure: COLONOSCOPY WITH PROPOFOL;  Surgeon: Ronald Lobo, MD;  [facility];  Service: Endoscopy;  Laterality: N/A;  . CRANIOTOMY Right 10/18/2013   Procedure: CRANIOTOMY TUMOR EXCISION;  Surgeon: Floyce Stakes, MD;   [facility]: [facility];  Service: Neurosurgery;  Laterality: Right;  . ESOPHAGOGASTRODUODENOSCOPY (EGD) WITH PROPOFOL N/A 11/07/2019   Procedure: ESOPHAGOGASTRODUODENOSCOPY (EGD) WITH PROPOFOL;  Surgeon: Ronald Lobo, MD;  Location: [facility];  Service: Endoscopy;  Laterality: N/A;  . EYE SURGERY    . HERNIA REPAIR    . IR THORACENTESIS ASP PLEURAL SPACE W/IMG GUIDE  04/01/2020  . KNEE ARTHROSCOPY Bilateral   . LAPAROSCOPIC RIGHT HEMI COLECTOMY Right 03/06/2020   Procedure: LAPAROSCOPIC RIGHT HEMI COLECTOMY WITH TAP BLOCK AND LYSIS OF ADHESIONS;  Surgeon: Ileana Roup, MD;  Location: [facility];  Service: General;  Laterality: Right;  . POLYPECTOMY     small adenomatous  . ULNAR TUNNEL RELEASE      Social History  reports that she has never smoked. She has never used smokeless tobacco. She reports that she does not drink alcohol and does not use drugs.  Allergies  Allergen Reactions  . Bystolic [Nebivolol Hcl] Other (See Comments)    "extreme weakness, heaviness in legs & arms, swelling in legs/arms/face, swollen abdomen, pain in bladder, feet pain, soreness in chest"  . Carbamazepine Other (See Comments)    Blood poisoning  Other reaction(s): Unknown  . Cholestyramine Other (See Comments)    "itching rash on stomach, bloated, nausea, vomiting, sleeplessness, extreme pain in arms"  . Hydrazine Yellow [Tartrazine] Other (See Comments)    "does not reduce high blood pressure, pain in arm, high pressure, felt like I was on verge of heart attack, really weak"  . Morphine Other (See Comments)    Feels morbid, weak, still in pain  . Niacin Palpitations    Fast heart beat Other reaction(s): Unknown  . Niaspan [Niacin Er] Palpitations and Other (See Comments)    "fast heart beat, high blood pressure"  . Norvasc [Amlodipine Besylate] Other (See Comments)    "extreme fluid retention/pain)  . Optivar [Azelastine Hcl] Photosensitivity  . Repaglinide Hives  . Sular [Nisoldipine Er]  Other (See Comments)    "severe headaches, swelling eyes, hands, feet, shortness of breath, weak, flushed face, brain boiling, fluid retention, high blood sugar, nervous, heart fast beating"  . Telmisartan Other (See Comments)    "headache, difficulty urinating, high blood sugar, fluid retention"  . Amoxicillin-Pot Clavulanate Rash and Other (See Comments)  . Cefdinir Swelling    Vaginal irritation, breathing,   . Ciprofloxacin Hcl Hives  . Clonidine Other (See Comments)    Dry mouth, fluid retention  . Clonidine Hydrochloride Other (See Comments)    Dry mouth, fluid retention  . Codeine Nausea And Vomiting  . Ezetimibe Other (See Comments)    Made weak Other reaction(s): Unknown  . Hydroxychloroquine Other (See Comments)    Low platlets  . Naproxen Other (See Comments)    Shrinks bladder  . Sulfa Antibiotics Rash  . Ziac [Bisoprolol-Hydrochlorothiazide] Other (See Comments)    "stopped urination"  . Amlodipine Besy-Benazepril Hcl  Other reaction(s): cough  . Azelastine Other (See Comments)    Unknown reaction - Per MAR  . Elavil [Amitriptyline] Other (See Comments)    Gave Pt nightmares  . Empagliflozin Other (See Comments)    "Caused yeast infection, slowed my urine"  . Hydralazine Other (See Comments)    Unknown reaction - Per MAR  . Iodine I 131 Tositumomab Other (See Comments)    Unknown reaction - Per MAR  . Kenalog [Triamcinolone] Diarrhea    Per MAR  . Keppra [Levetiracetam] Other (See Comments)    Shaking  . Lamotrigine Itching and Other (See Comments)  . Norvasc [Amlodipine] Other (See Comments)    Unknown reaction - Per MAR  . Other Other (See Comments)    Other reaction(s): Unknown Other reaction(s): Unknown Other reaction(s): Unknown Other reaction(s): Unknown Other reaction(s): Unknown  . Pregabalin Swelling    Other reaction(s): weight gain  . Pseudoephedrine   . Pseudoephedrine-Guaifenesin Er Other (See Comments)    Unknown reaction - Per MAR  .  Risedronate     Unknown reaction - Per MAR  . Ru-Hist D [Brompheniramine-Phenylephrine] Other (See Comments)    Unknown reaction - Per MAR  . Sumatriptan Other (See Comments)  . Topiramate Other (See Comments)    Dry eyes Other reaction(s): Unknown  . Ace Inhibitors Other (See Comments)    unknown  . Actonel [Risedronate Sodium] Other (See Comments)    unknown  . Amlodipine Besylate Other (See Comments)    unknown  . Aspirin Other (See Comments)    unknown  . Atacand [Candesartan] Other (See Comments)    Unknown reaction - MAR  . Bextra [Valdecoxib] Other (See Comments)    Unknown reaction - Per MAR  . Bisoprolol-Hydrochlorothiazide Other (See Comments)    Unknown reaction - Per MAR  . Candesartan Cilexetil Other (See Comments)    unknown  . Cefadroxil Other (See Comments)    unknown  . Celecoxib Rash  . Hydrocodone Other (See Comments)    unknown  . Hydrocodone-Acetaminophen Other (See Comments)    unknown  . Iodinated Diagnostic Agents Rash    "All over"  Other reaction(s): Unknown  . Meloxicam Other (See Comments)    unknown  . Methylprednisolone Sodium Succinate Other (See Comments)    unknown  . Nabumetone Other (See Comments)    Unknown reaction  . Penicillins Other (See Comments)    unknown  . Pseudoephedrine-Guaifenesin Other (See Comments)    unknown  . Risedronate Sodium Other (See Comments)    unknown  . Rofecoxib Other (See Comments)    Unknown reaction - MAR Other reaction(s): Unknown  . Ru-Tuss [Chlorphen-Pse-Atrop-Hyos-Scop] Other (See Comments)    unknown  . Sulfonamide Derivatives Other (See Comments)    unknown  . Sulphur [Elemental Sulfur] Other (See Comments)    unknown  . Telithromycin Other (See Comments)    unknown  . Terfenadine Other (See Comments)    Unknown reaction - Per MAR  . Trandolapril-Verapamil Hcl Er Other (See Comments)    Headache, difficulty urinating, high blood sugar, fluid retention  Pt is taking Tarka  (trandolapril-verapamil) currently, but requests the medication stay in her allergy list  . Trandolapril-Verapamil Hcl Er Other (See Comments)    Headache, difficulty urinating, high blood sugar, fluid retention  Pt is taking Tarka (trandolapril-verapamil) currently, but requests the medication stay in her allergy list  . Valium [Diazepam] Other (See Comments)    Makes her mean and hyper    Family History  Problem Relation Age of Onset  . Heart disease Mother   . Osteoporosis Mother   . Diabetes Father   . Pancreatic cancer Father   . Colon cancer Other   . Bone cancer Sister   . Prostate cancer Brother   . Colon cancer Brother   . Rectal cancer Sister   . Thyroid disease Sister        benign goiter resected    Prior to Admission medications   Medication Sig Start Date End Date Taking? Authorizing Provider  ALPRAZolam (XANAX) 0.25 MG tablet TAKE 1 TABLET AT BEDTIME AS NEEDED (CAN TAKE 1 TABLET DURING THE DAY IF NEEDED FOR ANXIETY) Patient taking differently: Take 0.25 mg by mouth at bedtime as needed. TAKE 1 TABLET AT BEDTIME AS NEEDED (CAN TAKE 1 TABLET DURING THE DAY IF NEEDED FOR ANXIETY) 04/02/20   Danford, Suann Larry, MD  Biotin 1000 MCG tablet 1 tablet    [provider]  Blood Glucose Monitoring Suppl (PRODIGY VOICE BLOOD GLUCOSE) w/Device KIT Use to check blood sugar 1 time per day. 10/18/15   Renato Shin, MD  Cholecalciferol (VITAMIN D-3) 125 MCG (5000 UT) TABS Take 5,000 Units by mouth in the morning and at bedtime.    [provider]  Cholecalciferol (VITAMIN D-3) 5000 UNIT/ML LIQD 1 capsule    [provider]  cholestyramine (QUESTRAN) 4 g packet 1 packet mixed with water or non-carbonated drink    [provider]  cholestyramine light (PREVALITE) 4 g packet Take 2 g by mouth in the morning and at bedtime.  11/29/19   [provider]  colesevelam (WELCHOL) 625 MG tablet 2 capsules 03/21/19   [provider]   Cranberry 425 MG CAPS Take 425 mg by mouth daily.    [provider]  cyanocobalamin (,VITAMIN B-12,) 1000 MCG/ML injection INJECT 1 ML INTRAMUSCULARLY EVERY 21 DAYS Patient taking differently: Inject 1,000 mcg into the muscle every 21 ( twenty-one) days. 06/18/16   Hoyt Koch, MD  Cyanocobalamin 1000 MCG/ML KIT See admin instructions.    [provider]  diclofenac Sodium (VOLTAREN) 1 % GEL Apply 2 g topically 4 (four) times daily. 06/01/19   [provider]  diphenhydrAMINE (BENADRYL ALLERGY) 25 MG tablet 1 tablet    [provider]  docusate sodium (COLACE) 100 MG capsule Take 200 mg by mouth 2 (two) times daily as needed (for constipation.).     [provider]  empagliflozin (JARDIANCE) 25 MG TABS tablet 1 tablet (10 mg)    [provider]  enoxaparin (LOVENOX) 60 MG/0.6ML injection Inject 60 mg into the skin every 12 (twelve) hours.    [provider]  escitalopram (LEXAPRO) 10 MG tablet 1 tablet    [provider]  feeding supplement, GLUCERNA SHAKE, (GLUCERNA SHAKE) LIQD Take 237 mLs by mouth 2 (two) times daily.    [provider]  ferrous sulfate 325 (65 FE) MG tablet 1 tablet    [provider]  furosemide (LASIX) 20 MG tablet 1 tablet    [provider]  glucose blood (GLUCOSE METER TEST) test strip See admin instructions.    [provider]  Glucose Blood (PRODIGY VOICE BLOOD GLUCOSE VI)     [provider]  insulin aspart (NOVOLOG) 100 UNIT/ML FlexPen Inject 2-12 Units into the skin 3 (three) times daily with meals. Injects 2-10 units of insulin under the skin 3 times a day per sliding scale: CBG 0-150: 2 units; CBG 151-200: 4 units;  CBG 201-250: 6 units; CBG 251-300: 8 units; CBG 301-350: 10 units; CBG 351-400: 12 units; CBG >400: 12 units and notify the MD/NP 06/02/19   [provider]  insulin glargine (LANTUS SOLOSTAR) 100 UNIT/ML Solostar Pen 14  units    [provider]  Insulin Pen Needle 32G X 4 MM MISC Used to inject insulin 3x daily 11/19/16   Renato Shin, MD  levothyroxine (SYNTHROID) 75 MCG tablet 1 tablet on an empty stomach in the morning    [provider]  lidocaine (LIDODERM) 5 % Place 1 patch onto the skin daily. Remove & Discard patch within 12 hours or as directed by MD 01/25/15   Hoyt Koch, MD  ondansetron (ZOFRAN-ODT) 4 MG disintegrating tablet Take 1 tablet (4 mg total) by mouth every 6 (six) hours as needed for nausea. 03/19/20   Ileana Roup, MD  oxyCODONE (OXY IR/ROXICODONE) 5 MG immediate release tablet oxycodone 5 mg tablet  TAKE 1 TABLET (5 MG TOTAL) BY MOUTH EVERY 6 (SIX) HOURS AS NEEDED FOR UP TO 5 DAYS (SEVERE PAIN NOT CONTROLLED WITH TYLENOL FIRST.DO NOT TAKE WITH TRAMADOL)    [provider]  oxyCODONE-acetaminophen (PERCOCET) 5-325 MG tablet Take 1-2 tablets by mouth every 6 (six) hours as needed. 04/21/20   Charlesetta Shanks, MD  phenytoin (DILANTIN) 100 MG ER capsule Take 1 capsule (100 mg) in the morning and Take 2 capsules (200 mg) at bedtime Patient taking differently: Take 100-200 mg by mouth See admin instructions. Take 1 capsule (100 mg) in the morning & Take 2 capsules (200 mg) at bedtime 05/16/19   Ward Givens, NP  phenytoin (PHENYTOIN INFATABS) 50 MG tablet 150 in the Am and 200 in the Pm    [provider]  Polyethyl Glycol-Propyl Glycol (SYSTANE) 0.4-0.3 % SOLN 2 drop in bilateral eyes    [provider]  primidone (MYSOLINE) 50 MG tablet Three tablets in the morning and 2 at dinner time 05/23/20   Kathrynn Ducking, MD  primidone (MYSOLINE) 50 MG tablet 3 tablets    [provider]  Thiamine HCl (VITAMIN B-1) 250 MG tablet Take 250 mg by mouth at bedtime.    [provider]  traMADol (ULTRAM) 50 MG tablet 1 tablet    [provider]  trandolapril-verapamil (TARKA) 2-240 MG tablet Take 1 tablet by mouth at  bedtime.    [provider]  trandolapril-verapamil [place]) 2-240 MG tablet 1 tablet    [provider]    Physical Exam: Vitals:   06/04/20 1047 06/04/20 1145 06/04/20 1302  BP: (!) 180/92 (!) 158/83 (!) 160/74  Pulse: 84 89 (!) 102  Resp: (!) 21 (!) 27 (!) 21  Temp: 98.3 F (36.8 C)    TempSrc: Oral    SpO2: 100% 100% 90%    Constitutional: In moderate distress during my evaluation secondary to severe right hip and lower back pain.  No chest pain, no shortness of breath, no nausea, no vomiting. Vitals:   06/04/20 1047 06/04/20 1145 06/04/20 1302  BP: (!) 180/92 (!) 158/83 (!) 160/74  Pulse: 84 89 (!) 102  Resp: (!) 21 (!) 27 (!) 21  Temp: 98.3 F (36.8 C)    TempSrc: Oral    SpO2: 100% 100% 90%   Eyes: PERRL, lids and conjunctivae normal; no icterus, no nystagmus. ENMT: Mucous membranes are moist. Posterior pharynx clear of any exudate or lesions. Neck: normal, supple, no masses, no thyromegaly, no JVD. Respiratory: clear to auscultation bilaterally,  no wheezing, no crackles. Normal respiratory effort. No accessory muscle use.  Cardiovascular: Sinus tachycardia, no rubs, no gallops, no lower extremity edema appreciated. Abdomen: no tenderness, no masses palpated. No hepatosplenomegaly. Bowel sounds positive.  Musculoskeletal: No cyanosis or clubbing, no major deformity appreciated; there is tenderness to palpation over her right hip; decreased range of motion. Skin: no rashes, lesions, ulcers. No induration Neurologic: CN 2-12 grossly intact. Sensation intact, DTR normal.  No focal weakness. Psychiatric: Normal judgment and insight. Alert and oriented x 3. Normal mood.   Labs on Admission: I have personally reviewed following labs and imaging studies  CBC: Recent Labs  Lab 06/04/20 1152  WBC 16.1*  NEUTROABS 14.4*  HGB 11.9*  HCT 36.1  MCV 92.1  PLT 878    Basic Metabolic Panel: Recent Labs  Lab 06/04/20 1152  NA 138  K 4.6  CL 105  CO2  21*  GLUCOSE 135*  BUN 14  CREATININE 0.83  CALCIUM 8.9    GFR: CrCl cannot be calculated (Unknown ideal weight.).  Urine analysis:    Component Value Date/Time   COLORURINE YELLOW 04/01/2020 1150   APPEARANCEUR CLEAR 04/01/2020 1150   LABSPEC 1.038 (H) 04/01/2020 1150   PHURINE 6.0 04/01/2020 1150   GLUCOSEU NEGATIVE 04/01/2020 1150   GLUCOSEU NEGATIVE 12/28/2013 1036   HGBUR NEGATIVE 04/01/2020 1150   HGBUR trace-intact 06/29/2007 1304   BILIRUBINUR NEGATIVE 04/01/2020 1150   BILIRUBINUR negative 01/19/2017 1049   KETONESUR NEGATIVE 04/01/2020 1150   PROTEINUR NEGATIVE 04/01/2020 1150   UROBILINOGEN 0.2 01/19/2017 1049   UROBILINOGEN 1.0 03/25/2015 1755   NITRITE NEGATIVE 04/01/2020 1150   LEUKOCYTESUR LARGE (A) 04/01/2020 1150    Radiological Exams on Admission: CT Head Wo Contrast  Result Date: 06/04/2020 CLINICAL DATA:  Unwitnessed fall. EXAM: CT HEAD WITHOUT CONTRAST TECHNIQUE: Contiguous axial images were obtained from the base of the skull through the vertex without intravenous contrast. COMPARISON:  April 21, 2020. FINDINGS: Brain: Stable right frontal encephalomalacia consistent with postoperative change. No mass effect or midline shift is noted. Ventricular size is within normal limits. There is no evidence of mass lesion, hemorrhage or acute infarction. Vascular: No hyperdense vessel or unexpected calcification. Skull: Status post right frontal craniotomy. No acute abnormality is noted. Sinuses/Orbits: No acute finding. Other: None. IMPRESSION: Stable right frontal encephalomalacia consistent with postoperative change. No acute intracranial abnormality seen. Electronically Signed   By: Marijo Conception M.D.   On: 06/04/2020 12:18   DG Chest Port 1 View  Result Date: 06/04/2020 CLINICAL DATA:  Fall.  Fractured hip.  Preop. EXAM: PORTABLE CHEST 1 VIEW COMPARISON:  04/01/2020 FINDINGS: Mild right hemidiaphragm elevation. Numerous leads and wires project over the chest.  Midline trachea. Normal heart size. No pleural effusion or pneumothorax. Clear lungs. IMPRESSION: No acute cardiopulmonary disease. Electronically Signed   By: Abigail Miyamoto M.D.   On: 06/04/2020 12:59   US THYROID  Result Date: 06/03/2020 CLINICAL DATA:  Prior ultrasound follow-up. EXAM: THYROID ULTRASOUND TECHNIQUE: Ultrasound examination of the thyroid gland and adjacent soft tissues was performed. COMPARISON:  04/24/2014 FINDINGS: Parenchymal Echotexture: Moderately heterogenous Isthmus: 0.2 cm, previously 0.2 cm Right lobe: 4.5 x 1.1 x 1.3 cm, previously 4.7 x 1.4 x 1.9 cm Left lobe: 4.4 x 1.6 x 2.3 cm, previously 4.4 x 1.9 x 2.6 cm _________________________________________________________ Estimated total number of nodules >/= 1 cm: 2 Number of spongiform nodules >/=  2 cm not described below (TR1): 0 Number of mixed cystic and solid nodules >/= 1.5  cm not described below ([place]): 0 _________________________________________________________ Nodule # 1: Prior biopsy: No Location: Right; Mid Maximum size: 1.0 cm; Other 2 dimensions: 0.9 x 0.5 cm, previously, 1.7 x 1.7 x 0.8 cm Composition: solid/almost completely solid (2) Echogenicity: hypoechoic (2) Shape: not taller-than-wide (0) Margins: ill-defined (0) Echogenic foci: none (0) ACR TI-RADS total points: 4. ACR TI-RADS risk category:  TR4 (4-6 points). Significant change in size (>/= 20% in two dimensions and minimal increase of 2 mm): No Change in features: Yes Change in ACR TI-RADS risk category: Yes ACR TI-RADS recommendations: This nodule does NOT meet TI-RADS criteria for biopsy or dedicated follow-up given 6 years stability. _________________________________________________________ Nodule # 2: Prior biopsy: No Location: Left; Superior Maximum size: 0.8 cm; Other 2 dimensions: 0.6 x 0.4 cm, previously, 0.9 x 0.6 x 0.5 cm Composition: solid/almost completely solid (2) Echogenicity: hypoechoic (2) Shape: not taller-than-wide (0) Margins: smooth (0)  Echogenic foci: none (0) ACR TI-RADS total points: 4. ACR TI-RADS risk category:  TR4 (4-6 points). Significant change in size (>/= 20% in two dimensions and minimal increase of 2 mm): No Change in features: No Change in ACR TI-RADS risk category: No ACR TI-RADS recommendations: Given size (<0.9 cm) and appearance, this nodule does NOT meet TI-RADS criteria for biopsy or dedicated follow-up. _________________________________________________________ Nodule # 3: Prior biopsy: No Location: Left; Mid Maximum size: 2.5 cm; Other 2 dimensions: 2.4 x 1.4 cm, previously, 2.4 x 2.3 x 1.3 cm Composition: mixed cystic and solid (1) Echogenicity: isoechoic (1) Shape: not taller-than-wide (0) Margins: smooth (0) Echogenic foci: none (0) ACR TI-RADS total points: 2. ACR TI-RADS risk category:  TR2 (2 points). Significant change in size (>/= 20% in two dimensions and minimal increase of 2 mm): No Change in features: No Change in ACR TI-RADS risk category: No ACR TI-RADS recommendations: This nodule does NOT meet TI-RADS criteria for biopsy or dedicated follow-up. _________________________________________________________ IMPRESSION: 1. Similar-appearing multinodular goiter. 2. The dominant nodule in the left mid thyroid (labeled 3, 2.5 cm, TI-RADS category 2) remains benign in appearance. No dedicated ultrasound follow-up or tissue sampling is recommended. 3. Decreased size of previously visualized right mid thyroid nodule (labeled 1, TI-RADS category 4) from 1.7 cm to 1.0 cm, now declared benign. No dedicated ultrasound follow-up or tissue sampling is recommended. The above is in keeping with the ACR TI-RADS recommendations - J Am Coll Radiol 2017;14:587-595. Ruthann Cancer, MD [facility] Electronically Signed   By: Ruthann Cancer MD   On: 06/03/2020 12:33   DG Hip Unilat With Pelvis 2-3 Views Right  Result Date: 06/04/2020 CLINICAL DATA:  Right hip pain after fall today.  EXAM: DG HIP (WITH OR WITHOUT PELVIS) 2-3V RIGHT COMPARISON:  None. FINDINGS: Moderately displaced proximal right femoral neck fracture is noted. IMPRESSION: Moderately displaced proximal right femoral neck fracture. Electronically Signed   By: Marijo Conception M.D.   On: 06/04/2020 12:31    EKG: Independently reviewed.  Then, no acute ischemic changes; normal QT.  Assessment/Plan 1-closed right hip fracture ([place]) -Will follow hip fracture protocol -Orthopedic service planning for surgical repair on 06/05/2020 -As needed analgesics and muscle relaxant will be provided -Physical therapy/Occupational Therapy evaluation after surgical repair -Patient [place] end requiring rehabilitation at the skilled nursing facility -Will check vitamin D level -Of note, prior to admission patient was living alone unable to perform her activities of daily living.  She was using a walker for ambulation. -N.p.o. after midnight.  2-Insulin-requiring or dependent type II  diabetes mellitus ([facility]) -Sliding scale insulin has been ordered -Continue adjusted dose of Levemir -Check A1c and follow CBGs.  3-Essential hypertension -Elevated blood pressure in the setting of pain. -Will attempt to control pain better and resume home antihypertensive regimen.  4-GERD -Continue PPI.  5-Anxiety/depression -Continue as needed anxiolytic as per home regimen. -Continue Lexapro.  6-Seizure disorder ([place]) -No seizure activity reported -Continue home antiepileptic drugs.  7-Tremor, essential -Continue primidone  8-hypothyroidism -Continue Synthroid  DVT prophylaxis: Heparin Code Status:   Full code. Family Communication:  Niece at bedside. Disposition Plan:   Patient is from:  home  Anticipated DC to:  To be determine; might need SNF  Anticipated DC date:  To be determined   Anticipated DC barriers: Surgical repair of hip fx Consults called:  Orthopedic service (Dr. Lyla Glassing will do surgery on 06/05/20 Admission  status:  MedSurg, inpatient status, length of stay more than 2 midnights.  Severity of Illness: Moderate illness; patient admitted with mechanical fall and subsequent right femoral neck fracture that will require surgical repair.    Barton Dubois MD [facility]  How to contact the [facility] Attending or Consulting provider Miguel Barrera or covering provider during after hours [place], for this patient?   1. Check the care team in [facility] and look for a) attending/consulting [facility] provider listed and b) the [facility] team listed 2. Log into [email or web address] and use [facility] universal password to access. If you do not have the password, please contact the hospital operator. 3. Locate the [facility] provider you are looking for under [facility] and page to a number that you can be directly reached. 4. If you still have difficulty reaching the provider, please page the [facility] (Director on Call) for the Hospitalists listed on amion for assistance.  06/04/2020, 1:34 PM

## 2020-06-04 NOTE — ED Provider Notes (Addendum)
[facility] DEPT Provider Note   CSN: [id number] Arrival date & time: 06/04/20  1014     History Chief Complaint  Patient presents with  . Fall    [name] is a 87 y.o. female.  HPI    87 year old female comes in a chief complaint of fall.. Patient has multiple medical comorbidities but is able to live by herself at her home.  She reports that she had a fall earlier this morning around 6:00.  At 9 AM her family member found her down and called EMS.  Patient fell after she got out of the bed and was trying to sit on the bedside commode.  She is having significant pain over the right hip and back region.  She denies any chest pain, shortness of breath, numbness, tingling.  Patient did strike her head with the fall.  She denies any neck pain  Past Medical History:  Diagnosis Date  . Anemia   . Anxiety   . Asthma   . Breast cancer ([place]) 2014   right breast  . Cancer of right breast ([place]) 12/26/12   right breast 12:00 o'clock, DCIS  . Carotid artery disease ([place])   . Carpal tunnel syndrome, bilateral   . Chronic bronchitis ([place])   . Chronic cough   . Chronic facial pain   . Chronic foot pain   . Colon cancer ([place]) dx'd 11/2019  . Complication of anesthesia    Sore jaw; could not chew or move mouth, prolonged sedation  . Convulsions/seizures ([place]) 10/16/2014  . Diabetes mellitus    type 2 niddm x 20 years  . Dyslipidemia   . Ejection fraction   . Gait abnormality 12/04/2019  . GERD (gastroesophageal reflux disease)   . Hammer toe    bilateral  . History of colonic polyps   . History of meningioma   . HTN (hypertension)   . Hx of radiation therapy 03/07/13- 03/29/13   right breast 4250 cGy 17 sessions  . Hyperlipidemia   . Hypokalemia   . Hyponatremia   . Hypothyroidism   . IBS (irritable bowel syndrome)   . Melanoma ([place])   . Metatarsal bone fracture 2014  . Multiple drug allergies   . Nontoxic thyroid nodule   . Obesity   .  Osteoarthritis   . Osteoporosis   . Palpitations   . Personal history of radiation therapy 2014  . Seasonal allergies   . Skin cancer   . Syncope   . Tremor, essential 08/18/2016  . Vitamin B12 deficiency   . Vitamin D deficiency     Patient Active Problem List   Diagnosis Date Noted  . Closed right hip fracture ([place]) 06/04/2020  . Constipation 05/27/2020  . Dyslipidemia 05/27/2020  . Enterocolitis due to Clostridium difficile, not specified as recurrent 05/27/2020  . Pleural effusion on right 04/01/2020  . Acute respiratory failure with hypoxia ([place]) 04/01/2020  . Pneumonia of right lung due to infectious organism 04/01/2020  . Uncontrolled type 2 diabetes mellitus with hyperglycemia, with long-term current use of insulin ([place]) 04/01/2020  . Abnormal CT of the chest 04/01/2020  . Subacute pulmonary embolism ([place]) 04/01/2020  . Acute metabolic encephalopathy 78/29/5621  . Adenocarcinoma of colon ([place]) 04/01/2020  . Nausea & vomiting 03/12/2020  . Cancer of ascending colon pT3pN0 (0/12 LN) s/p lap right colectomy 03/06/2020 03/09/2020  . History of multiple allergies 03/09/2020  . S/P right hemicolectomy 03/06/2020  . Closed fracture of shaft of tibia 02/12/2020  .  Abnormal feces 12/11/2019  . Gait abnormality 12/04/2019  . Impaired left ventricular function 09/05/2019  . Hypokalemia   . Effusion of left knee joint   . Hyponatremia   . Left patella fracture 10/26/2018  . Fracture of patella 10/26/2018  . Pain in left knee 09/29/2018  . Acquired hypothyroidism 08/25/2017  . 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 Hammer toe 08/25/2017  . History of breast cancer 08/25/2017  . Nontoxic thyroid nodule 08/25/2017  . Osteopetrosis 08/25/2017  . Dysuria 01/19/2017  . Vitamin D deficiency 11/18/2016  . Tremor, essential 08/18/2016  . Seizure disorder ([place]) 10/16/2014  . Brain tumor (benign) ([place]) 10/18/2013  . Subdural hemorrhage ([place]) 10/09/2013  . Anxiety  10/09/2013  . Compression fracture of T12 vertebra ([place]) 10/09/2013  . Nontoxic multinodular goiter 09/05/2013  . Syncope 08/27/2013  . Carotid artery disease ([place]) 08/27/2013  . Abnormal thyroid ultrasound 08/27/2013  . Cancer of central portion of female breast ([place]) 12/30/2012  . Osteoporosis 03/06/2010  . ASTHMA 03/05/2009  . Asthma 03/05/2009  . IRRITABLE BOWEL SYNDROME  diarrhea type 03/21/2008  . ANEMIA-NOS 12/16/2006  . GERD 12/16/2006  . Insulin-requiring or dependent type II diabetes mellitus ([place]) 10/29/2006  . Mixed hyperlipidemia 10/29/2006  . Essential hypertension 10/29/2006  . Allergic rhinitis, cause unspecified 10/29/2006  . OSTEOARTHRITIS 10/29/2006    Past Surgical History:  Procedure Laterality Date  . ABDOMINAL HYSTERECTOMY    . BIOPSY  11/07/2019   Procedure: BIOPSY;  Surgeon: Ronald Lobo, MD;  Location: [facility];  Service: Endoscopy;;  . BRAIN SURGERY    . BREAST BIOPSY Right 01/24/2013   Procedure: RE-EXCICION OF BREAST CANCER, ANTERIOR MARGINS;  Surgeon: Edward Jolly, MD;  Location: [facility];  Service: General;  Laterality: Right;  . BREAST LUMPECTOMY Right 2014  . BREAST LUMPECTOMY WITH NEEDLE LOCALIZATION Right 01/17/2013   Procedure: BREAST LUMPECTOMY WITH NEEDLE LOCALIZATION;  Surgeon: Edward Jolly, MD;  Location: [facility];  Service: General;  Laterality: Right;  . BUNIONECTOMY Bilateral   . CATARACT EXTRACTION W/ INTRAOCULAR LENS IMPLANT Right   . CHOLECYSTECTOMY    . COLONOSCOPY    . COLONOSCOPY WITH PROPOFOL N/A 11/07/2019   Procedure: COLONOSCOPY WITH PROPOFOL;  Surgeon: Ronald Lobo, MD;  Location: [facility];  Service: Endoscopy;  Laterality: N/A;  . CRANIOTOMY Right 10/18/2013   Procedure: CRANIOTOMY TUMOR EXCISION;  Surgeon: Floyce Stakes, MD;  Location: [facility];  Service: Neurosurgery;  Laterality: Right;  . ESOPHAGOGASTRODUODENOSCOPY (EGD) WITH PROPOFOL N/A 11/07/2019   Procedure: ESOPHAGOGASTRODUODENOSCOPY (EGD)  WITH PROPOFOL;  Surgeon: Ronald Lobo, MD;  Location: [facility];  Service: Endoscopy;  Laterality: N/A;  . EYE SURGERY    . HERNIA REPAIR    . IR THORACENTESIS ASP PLEURAL SPACE W/IMG GUIDE  04/01/2020  . KNEE ARTHROSCOPY Bilateral   . LAPAROSCOPIC RIGHT HEMI COLECTOMY Right 03/06/2020   Procedure: LAPAROSCOPIC RIGHT HEMI COLECTOMY WITH TAP BLOCK AND LYSIS OF ADHESIONS;  Surgeon: Ileana Roup, MD;  Location: [facility];  Service: General;  Laterality: Right;  . POLYPECTOMY     small adenomatous  . ULNAR TUNNEL RELEASE       OB History   No obstetric history on file.     Family History  Problem Relation Age of Onset  . Heart disease Mother   . Osteoporosis Mother   . Diabetes Father   . Pancreatic cancer Father   . Colon cancer Other   . Bone cancer Sister   . Prostate  cancer Brother   . Colon cancer Brother   . Rectal cancer Sister   . Thyroid disease Sister        benign goiter resected    Social History   Tobacco Use  . Smoking status: Never Smoker  . Smokeless tobacco: Never Used  Vaping Use  . Vaping Use: Never used  Substance Use Topics  . Alcohol use: No  . Drug use: No    Home Medications Prior to Admission medications   Medication Sig Start Date End Date Taking? Authorizing Provider  ALPRAZolam (XANAX) 0.25 MG tablet TAKE 1 TABLET AT BEDTIME AS NEEDED (CAN TAKE 1 TABLET DURING THE DAY IF NEEDED FOR ANXIETY) Patient taking differently: Take 0.25 mg by mouth at bedtime as needed. TAKE 1 TABLET AT BEDTIME AS NEEDED (CAN TAKE 1 TABLET DURING THE DAY IF NEEDED FOR ANXIETY) 04/02/20   Danford, Suann Larry, MD  Biotin 1000 MCG tablet 1 tablet    [provider]  Blood Glucose Monitoring Suppl (PRODIGY VOICE BLOOD GLUCOSE) w/Device KIT Use to check blood sugar 1 time per day. 10/18/15   Renato Shin, MD  Cholecalciferol (VITAMIN D-3) 125 MCG (5000 UT) TABS Take 5,000 Units by mouth in the morning and at bedtime.    [provider]   Cholecalciferol (VITAMIN D-3) 5000 UNIT/ML LIQD 1 capsule    [provider]  cholestyramine (QUESTRAN) 4 g packet 1 packet mixed with water or non-carbonated drink    [provider]  cholestyramine light (PREVALITE) 4 g packet Take 2 g by mouth in the morning and at bedtime.  11/29/19   [provider]  colesevelam (WELCHOL) 625 MG tablet 2 capsules 03/21/19   [provider]  Cranberry 425 MG CAPS Take 425 mg by mouth daily.    [provider]  cyanocobalamin (,VITAMIN B-12,) 1000 MCG/ML injection INJECT 1 ML INTRAMUSCULARLY EVERY 21 DAYS Patient taking differently: Inject 1,000 mcg into the muscle every 21 ( twenty-one) days. 06/18/16   Hoyt Koch, MD  Cyanocobalamin 1000 MCG/ML KIT See admin instructions.    [provider]  diclofenac Sodium (VOLTAREN) 1 % GEL Apply 2 g topically 4 (four) times daily. 06/01/19   [provider]  diphenhydrAMINE (BENADRYL ALLERGY) 25 MG tablet 1 tablet    [provider]  docusate sodium (COLACE) 100 MG capsule Take 200 mg by mouth 2 (two) times daily as needed (for constipation.).     [provider]  empagliflozin (JARDIANCE) 25 MG TABS tablet 1 tablet (10 mg)    [provider]  enoxaparin (LOVENOX) 60 MG/0.6ML injection Inject 60 mg into the skin every 12 (twelve) hours.    [provider]  escitalopram (LEXAPRO) 10 MG tablet 1 tablet    [provider]  feeding supplement, GLUCERNA SHAKE, (GLUCERNA SHAKE) LIQD Take 237 mLs by mouth 2 (two) times daily.    [provider]  ferrous sulfate 325 (65 FE) MG tablet 1 tablet    [provider]  fluconazole (DIFLUCAN) 150 MG tablet Take 150 mg by mouth daily.    [provider]  furosemide (LASIX) 20 MG tablet 1 tablet    [provider]  glucose blood (GLUCOSE METER TEST) test strip See admin instructions.    [provider]  Glucose Blood (PRODIGY  VOICE BLOOD GLUCOSE VI)     [provider]  insulin aspart (NOVOLOG) 100 UNIT/ML FlexPen Inject 2-12 Units into the skin 3 (three) times daily with meals.  Injects 2-10 units of insulin under the skin 3 times a day per sliding scale: CBG 0-150: 2 units; CBG 151-200: 4 units; CBG 201-250: 6 units; CBG 251-300: 8 units; CBG 301-350: 10 units; CBG 351-400: 12 units; CBG >400: 12 units and notify the MD/NP 06/02/19   [provider]  insulin glargine (LANTUS SOLOSTAR) 100 UNIT/ML Solostar Pen 14 units    [provider]  Insulin Pen Needle 32G X 4 MM MISC Used to inject insulin 3x daily 11/19/16   Renato Shin, MD  levothyroxine (SYNTHROID) 75 MCG tablet 1 tablet on an empty stomach in the morning    [provider]  lidocaine (LIDODERM) 5 % Place 1 patch onto the skin daily. Remove & Discard patch within 12 hours or as directed by MD 01/25/15   Hoyt Koch, MD  ondansetron (ZOFRAN-ODT) 4 MG disintegrating tablet Take 1 tablet (4 mg total) by mouth every 6 (six) hours as needed for nausea. 03/19/20   Ileana Roup, MD  oxyCODONE (OXY IR/ROXICODONE) 5 MG immediate release tablet oxycodone 5 mg tablet  TAKE 1 TABLET (5 MG TOTAL) BY MOUTH EVERY 6 (SIX) HOURS AS NEEDED FOR UP TO 5 DAYS (SEVERE PAIN NOT CONTROLLED WITH TYLENOL FIRST.DO NOT TAKE WITH TRAMADOL)    [provider]  oxyCODONE-acetaminophen (PERCOCET) 5-325 MG tablet Take 1-2 tablets by mouth every 6 (six) hours as needed. 04/21/20   Charlesetta Shanks, MD  phenytoin (DILANTIN) 100 MG ER capsule Take 1 capsule (100 mg) in the morning and Take 2 capsules (200 mg) at bedtime Patient taking differently: Take 100-200 mg by mouth See admin instructions. Take 1 capsule (100 mg) in the morning & Take 2 capsules (200 mg) at bedtime 05/16/19   Ward Givens, NP  phenytoin (PHENYTOIN INFATABS) 50 MG tablet 150 in the Am and 200 in the Pm    [provider]  Polyethyl Glycol-Propyl Glycol  (SYSTANE) 0.4-0.3 % SOLN 2 drop in bilateral eyes    [provider]  primidone (MYSOLINE) 50 MG tablet Three tablets in the morning and 2 at dinner time 05/23/20   Kathrynn Ducking, MD  primidone (MYSOLINE) 50 MG tablet 3 tablets    [provider]  Thiamine HCl (VITAMIN B-1) 250 MG tablet Take 250 mg by mouth at bedtime.    [provider]  traMADol (ULTRAM) 50 MG tablet 1 tablet    [provider]  trandolapril-verapamil (TARKA) 2-240 MG tablet Take 1 tablet by mouth at bedtime.    [provider]  trandolapril-verapamil [place]) 2-240 MG tablet 1 tablet    [provider]    Allergies    Bystolic [nebivolol hcl], Carbamazepine, Cholestyramine, Hydrazine yellow [tartrazine], Morphine, Niacin, Niaspan [niacin er], Norvasc [amlodipine besylate], Optivar [azelastine hcl], Repaglinide, Sular [nisoldipine er], Telmisartan, Amoxicillin-pot clavulanate, Cefdinir, Ciprofloxacin hcl, Clonidine, Clonidine hydrochloride, Codeine, Ezetimibe, Hydroxychloroquine, Naproxen, Sulfa antibiotics, Ziac [bisoprolol-hydrochlorothiazide], Amlodipine besy-benazepril hcl, Azelastine, Elavil [amitriptyline], Empagliflozin, Hydralazine, Iodine i 131 tositumomab, Kenalog [triamcinolone], Keppra [levetiracetam], Lamotrigine, Norvasc [amlodipine], Other, Pregabalin, Pseudoephedrine, Pseudoephedrine-guaifenesin er, Risedronate, Ru-hist d [brompheniramine-phenylephrine], Sumatriptan, Topiramate, Ace inhibitors, Actonel [risedronate sodium], Amlodipine besylate, Aspirin, Atacand [candesartan], Bextra [valdecoxib], Bisoprolol-hydrochlorothiazide, Candesartan cilexetil, Cefadroxil, Celecoxib, Hydrocodone, Hydrocodone-acetaminophen, Iodinated diagnostic agents, Meloxicam, Methylprednisolone sodium succinate, Nabumetone, Penicillins, Pseudoephedrine-guaifenesin, Risedronate sodium, Rofecoxib, Ru-tuss [chlorphen-pse-atrop-hyos-scop], Sulfonamide derivatives, Sulphur [elemental sulfur],  Telithromycin, Terfenadine, Trandolapril-verapamil hcl er, Trandolapril-verapamil hcl er, and Valium [diazepam]  Review of Systems   Review of Systems  Constitutional: Positive for activity change.  Respiratory: Negative for shortness of breath.  Cardiovascular: Negative for chest pain.  Musculoskeletal: Positive for arthralgias.  Allergic/Immunologic: Negative for immunocompromised state.  Hematological: Does not bruise/bleed easily.  All other systems reviewed and are negative.   Physical Exam Updated Vital Signs BP (!) 160/74 (BP Location: Left Arm)   Pulse (!) 102   Temp 98.3 F (36.8 C) (Oral)   Resp (!) 21   SpO2 90%   Physical Exam Vitals and nursing note reviewed.  Constitutional:      Appearance: She is well-developed.  HENT:     Head: Normocephalic and atraumatic.  Eyes:     Extraocular Movements: Extraocular movements intact and EOM normal.  Cardiovascular:     Rate and Rhythm: Normal rate.  Pulmonary:     Effort: Pulmonary effort is normal.  Abdominal:     General: Bowel sounds are normal.  Musculoskeletal:        General: Tenderness present. No deformity.     Cervical back: Normal range of motion and neck supple.     Comments: Patient has tenderness to palpation over the right hip  Skin:    General: Skin is warm and dry.  Neurological:     Mental Status: She is alert and oriented to person, place, and time.     ED Results / Procedures / Treatments   Labs (all labs ordered are listed, but only abnormal results are displayed) Labs Reviewed  BASIC METABOLIC PANEL - Abnormal; Notable for the following components:      Result Value   CO2 21 (*)    Glucose, Bld 135 (*)    All other components within normal limits  CBC WITH DIFFERENTIAL/PLATELET - Abnormal; Notable for the following components:   WBC 16.1 (*)    Hemoglobin 11.9 (*)    RDW 16.4 (*)    Neutro Abs 14.4 (*)    Abs Immature Granulocytes 0.11 (*)    All other components within normal  limits  SARS CORONAVIRUS 2 (TAT 6-24 HRS)  PROTIME-INR  CBC  CREATININE, SERUM  URINALYSIS, ROUTINE W REFLEX MICROSCOPIC  VITAMIN D 25 HYDROXY (VIT D DEFICIENCY, FRACTURES)  VITAMIN B12  TYPE AND SCREEN    EKG None  Radiology CT Head Wo Contrast  Result Date: 06/04/2020 CLINICAL DATA:  Unwitnessed fall. EXAM: CT HEAD WITHOUT CONTRAST TECHNIQUE: Contiguous axial images were obtained from the base of the skull through the vertex without intravenous contrast. COMPARISON:  April 21, 2020. FINDINGS: Brain: Stable right frontal encephalomalacia consistent with postoperative change. No mass effect or midline shift is noted. Ventricular size is within normal limits. There is no evidence of mass lesion, hemorrhage or acute infarction. Vascular: No hyperdense vessel or unexpected calcification. Skull: Status post right frontal craniotomy. No acute abnormality is noted. Sinuses/Orbits: No acute finding. Other: None. IMPRESSION: Stable right frontal encephalomalacia consistent with postoperative change. No acute intracranial abnormality seen. Electronically Signed   By: Marijo Conception M.D.   On: 06/04/2020 12:18   DG Chest Port 1 View  Result Date: 06/04/2020 CLINICAL DATA:  Fall.  Fractured hip.  Preop. EXAM: PORTABLE CHEST 1 VIEW COMPARISON:  04/01/2020 FINDINGS: Mild right hemidiaphragm elevation. Numerous leads and wires project over the chest. Midline trachea. Normal heart size. No pleural effusion or pneumothorax. Clear lungs. IMPRESSION: No acute cardiopulmonary disease. Electronically Signed   By: Abigail Miyamoto M.D.   On: 06/04/2020 12:59   US THYROID  Result Date: 06/03/2020 CLINICAL DATA:  Prior ultrasound follow-up. EXAM: THYROID ULTRASOUND TECHNIQUE: Ultrasound examination of the thyroid gland and  adjacent soft tissues was performed. COMPARISON:  04/24/2014 FINDINGS: Parenchymal Echotexture: Moderately heterogenous Isthmus: 0.2 cm, previously 0.2 cm Right lobe: 4.5 x 1.1 x 1.3 cm,  previously 4.7 x 1.4 x 1.9 cm Left lobe: 4.4 x 1.6 x 2.3 cm, previously 4.4 x 1.9 x 2.6 cm _________________________________________________________ Estimated total number of nodules >/= 1 cm: 2 Number of spongiform nodules >/=  2 cm not described below (TR1): 0 Number of mixed cystic and solid nodules >/= 1.5 cm not described below ([place]): 0 _________________________________________________________ Nodule # 1: Prior biopsy: No Location: Right; Mid Maximum size: 1.0 cm; Other 2 dimensions: 0.9 x 0.5 cm, previously, 1.7 x 1.7 x 0.8 cm Composition: solid/almost completely solid (2) Echogenicity: hypoechoic (2) Shape: not taller-than-wide (0) Margins: ill-defined (0) Echogenic foci: none (0) ACR TI-RADS total points: 4. ACR TI-RADS risk category:  TR4 (4-6 points). Significant change in size (>/= 20% in two dimensions and minimal increase of 2 mm): No Change in features: Yes Change in ACR TI-RADS risk category: Yes ACR TI-RADS recommendations: This nodule does NOT meet TI-RADS criteria for biopsy or dedicated follow-up given 6 years stability. _________________________________________________________ Nodule # 2: Prior biopsy: No Location: Left; Superior Maximum size: 0.8 cm; Other 2 dimensions: 0.6 x 0.4 cm, previously, 0.9 x 0.6 x 0.5 cm Composition: solid/almost completely solid (2) Echogenicity: hypoechoic (2) Shape: not taller-than-wide (0) Margins: smooth (0) Echogenic foci: none (0) ACR TI-RADS total points: 4. ACR TI-RADS risk category:  TR4 (4-6 points). Significant change in size (>/= 20% in two dimensions and minimal increase of 2 mm): No Change in features: No Change in ACR TI-RADS risk category: No ACR TI-RADS recommendations: Given size (<0.9 cm) and appearance, this nodule does NOT meet TI-RADS criteria for biopsy or dedicated follow-up. _________________________________________________________ Nodule # 3: Prior biopsy: No Location: Left; Mid Maximum size: 2.5 cm; Other 2 dimensions: 2.4 x 1.4 cm,  previously, 2.4 x 2.3 x 1.3 cm Composition: mixed cystic and solid (1) Echogenicity: isoechoic (1) Shape: not taller-than-wide (0) Margins: smooth (0) Echogenic foci: none (0) ACR TI-RADS total points: 2. ACR TI-RADS risk category:  TR2 (2 points). Significant change in size (>/= 20% in two dimensions and minimal increase of 2 mm): No Change in features: No Change in ACR TI-RADS risk category: No ACR TI-RADS recommendations: This nodule does NOT meet TI-RADS criteria for biopsy or dedicated follow-up. _________________________________________________________ IMPRESSION: 1. Similar-appearing multinodular goiter. 2. The dominant nodule in the left mid thyroid (labeled 3, 2.5 cm, TI-RADS category 2) remains benign in appearance. No dedicated ultrasound follow-up or tissue sampling is recommended. 3. Decreased size of previously visualized right mid thyroid nodule (labeled 1, TI-RADS category 4) from 1.7 cm to 1.0 cm, now declared benign. No dedicated ultrasound follow-up or tissue sampling is recommended. The above is in keeping with the ACR TI-RADS recommendations - J Am Coll Radiol 2017;14:587-595. Ruthann Cancer, MD [facility] Radiology Electronically Signed   By: Ruthann Cancer MD   On: 06/03/2020 12:33   DG Hip Unilat With Pelvis 2-3 Views Right  Result Date: 06/04/2020 CLINICAL DATA:  Right hip pain after fall today. EXAM: DG HIP (WITH OR WITHOUT PELVIS) 2-3V RIGHT COMPARISON:  None. FINDINGS: Moderately displaced proximal right femoral neck fracture is noted. IMPRESSION: Moderately displaced proximal right femoral neck fracture. Electronically Signed   By: Marijo Conception M.D.   On: 06/04/2020 12:31    Procedures Procedures   Medications Ordered in ED Medications  HYDROmorphone (DILAUDID) injection 0.5 mg (  0.5 mg Intravenous Given 06/04/20 1147)  heparin injection 5,000 Units (has no administration in time range)  0.9 %  sodium chloride infusion (has no  administration in time range)  HYDROmorphone (DILAUDID) injection 0.5 mg (has no administration in time range)  oxyCODONE (Oxy IR/ROXICODONE) immediate release tablet 5 mg (has no administration in time range)  methocarbamol (ROBAXIN) tablet 500 mg (has no administration in time range)  acetaminophen (TYLENOL) tablet 650 mg (has no administration in time range)  polyethylene glycol (MIRALAX / GLYCOLAX) packet 17 g (has no administration in time range)  ondansetron (ZOFRAN) injection 4 mg (has no administration in time range)  methocarbamol (ROBAXIN) tablet 500 mg (has no administration in time range)  HYDROmorphone (DILAUDID) injection 0.5 mg (has no administration in time range)  lactated ringers bolus 1,000 mL (1,000 mLs Intravenous New Bag/Given 06/04/20 1147)    ED Course  I have reviewed the triage vital signs and the nursing notes.  Pertinent labs & imaging results that were available during my care of the patient were reviewed by me and considered in my medical decision making (see chart for details).    MDM Rules/Calculators/A&P                          DDx includes: - Mechanical falls - ICH - Fractures - Contusions - Soft tissue injury  87 year old female comes in with chief complaint of fall.  Patient had a mechanical fall at her home.  She is complaining of primarily right hip pain.  Neurovascularly intact.  C-spine cleared clinically.  CT head, pelvis x-rays ordered.  Suspect that she likely has hip fracture.   1:28 PM Patient's CT brain looks normal.  X-ray of the hip confirms a femoral neck fracture.  Spoke with Dr. Percell Miller, he is okay with patient being admitted for hip replacement that would be done tomorrow most likely.  Patient can stay at [facility].  Hospitalist consulted for admission.  Patient and family made aware of the diagnosis Final Clinical Impression(s) / ED Diagnoses Final diagnoses:  Closed fracture of neck of right femur, initial encounter [facility])     Rx / DC Orders ED Discharge Orders    None       Varney Biles, MD 06/04/20 1275    Varney Biles, MD 06/04/20 1329

## 2020-06-04 NOTE — ED Notes (Signed)
Patient being transported to CT/XR at this time.

## 2020-06-05 ENCOUNTER — Inpatient Hospital Stay (HOSPITAL_COMMUNITY): Payer: Medicare Other | Admitting: Anesthesiology

## 2020-06-05 ENCOUNTER — Encounter (HOSPITAL_COMMUNITY): Payer: Self-pay | Admitting: Internal Medicine

## 2020-06-05 ENCOUNTER — Encounter (HOSPITAL_COMMUNITY)
Admission: EM | Disposition: A | Discharge status: Skilled Nursing Facility | Payer: Self-pay | Source: Home / Self Care | Attending: Internal Medicine

## 2020-06-05 ENCOUNTER — Other Ambulatory Visit: Payer: Self-pay

## 2020-06-05 ENCOUNTER — Inpatient Hospital Stay (HOSPITAL_COMMUNITY): Payer: Medicare Other

## 2020-06-05 DIAGNOSIS — S72001P Fracture of unspecified part of neck of right femur, subsequent encounter for closed fracture with malunion: Secondary | ICD-10-CM

## 2020-06-05 DIAGNOSIS — F419 Anxiety disorder, unspecified: Secondary | ICD-10-CM | POA: Diagnosis not present

## 2020-06-05 DIAGNOSIS — K219 Gastro-esophageal reflux disease without esophagitis: Secondary | ICD-10-CM | POA: Diagnosis not present

## 2020-06-05 DIAGNOSIS — D72829 Elevated white blood cell count, unspecified: Secondary | ICD-10-CM

## 2020-06-05 DIAGNOSIS — I10 Essential (primary) hypertension: Secondary | ICD-10-CM | POA: Diagnosis not present

## 2020-06-05 HISTORY — PX: TOTAL HIP ARTHROPLASTY: SHX124

## 2020-06-05 LAB — GLUCOSE, CAPILLARY
Glucose-Capillary: 96 mg/dL (ref 70–99)
Glucose-Capillary: 129 mg/dL — ABNORMAL HIGH (ref 70–99)
Glucose-Capillary: 118 mg/dL — ABNORMAL HIGH (ref 70–99)

## 2020-06-05 LAB — SURGICAL PCR SCREEN
MRSA, PCR: NEGATIVE
Staphylococcus aureus: NEGATIVE

## 2020-06-05 LAB — SARS CORONAVIRUS 2 (TAT 6-24 HRS): SARS Coronavirus 2: NEGATIVE

## 2020-06-05 LAB — VITAMIN D 25 HYDROXY (VIT D DEFICIENCY, FRACTURES): Vit D, 25-Hydroxy: 47.59 ng/mL (ref 30–100)

## 2020-06-05 LAB — HEMOGLOBIN A1C
Hgb A1c MFr Bld: 6.4 % — ABNORMAL HIGH (ref 4.8–5.6)
Mean Plasma Glucose: 136.98 mg/dL

## 2020-06-05 SURGERY — ARTHROPLASTY, HIP, TOTAL, ANTERIOR APPROACH
Anesthesia: Monitor Anesthesia Care | Site: Hip | Laterality: Right

## 2020-06-05 MED ORDER — PHENYLEPHRINE HCL (PRESSORS) 10 MG/ML IV SOLN
INTRAVENOUS | Status: AC
  Filled 2020-06-05: qty 2

## 2020-06-05 MED ORDER — KETAMINE HCL 10 MG/ML IJ SOLN
INTRAMUSCULAR | Status: DC | PRN
  Administered 2020-06-05: 30 mg via INTRAVENOUS

## 2020-06-05 MED ORDER — METOCLOPRAMIDE HCL 5 MG PO TABS: 5.0000 mg | ORAL_TABLET | Freq: Three times a day (TID) | ORAL | Status: DC | PRN

## 2020-06-05 MED ORDER — CHLORHEXIDINE GLUCONATE 4 % EX LIQD: 60.0000 mL | Freq: Once | CUTANEOUS | Status: DC

## 2020-06-05 MED ORDER — DEXAMETHASONE SODIUM PHOSPHATE 10 MG/ML IJ SOLN
INTRAMUSCULAR | Status: DC | PRN
  Administered 2020-06-05: 4 mg via INTRAVENOUS

## 2020-06-05 MED ORDER — ISOPROPYL ALCOHOL 70 % SOLN
Status: DC | PRN
  Administered 2020-06-05: 1 via TOPICAL

## 2020-06-05 MED ORDER — BUPIVACAINE IN DEXTROSE 0.75-8.25 % IT SOLN
INTRATHECAL | Status: DC | PRN
Start: 2020-06-05 — End: 2020-06-05
  Administered 2020-06-05: 1.8 mL via INTRATHECAL

## 2020-06-05 MED ORDER — DOCUSATE SODIUM 100 MG PO CAPS
100.0000 mg | ORAL_CAPSULE | Freq: Two times a day (BID) | ORAL | Status: DC
  Filled 2020-06-05: qty 1

## 2020-06-05 MED ORDER — CHLORHEXIDINE GLUCONATE CLOTH 2 % EX PADS
6.0000 | MEDICATED_PAD | Freq: Every day | CUTANEOUS | Status: DC
  Administered 2020-06-05 – 2020-06-10 (×4): 6 via TOPICAL

## 2020-06-05 MED ORDER — DEXAMETHASONE SODIUM PHOSPHATE 10 MG/ML IJ SOLN
INTRAMUSCULAR | Status: AC
  Filled 2020-06-05: qty 2

## 2020-06-05 MED ORDER — MENTHOL 3 MG MT LOZG: 1.0000 | LOZENGE | OROMUCOSAL | Status: DC | PRN

## 2020-06-05 MED ORDER — PANTOPRAZOLE SODIUM 40 MG PO TBEC: 40.0000 mg | DELAYED_RELEASE_TABLET | Freq: Every day | ORAL | Status: DC

## 2020-06-05 MED ORDER — KETAMINE HCL 10 MG/ML IJ SOLN
INTRAMUSCULAR | Status: AC
  Filled 2020-06-05: qty 1

## 2020-06-05 MED ORDER — LIDOCAINE HCL (PF) 2 % IJ SOLN
INTRAMUSCULAR | Status: AC
  Filled 2020-06-05: qty 15

## 2020-06-05 MED ORDER — LIP MEDEX EX OINT
TOPICAL_OINTMENT | CUTANEOUS | Status: AC
  Filled 2020-06-05: qty 7

## 2020-06-05 MED ORDER — CEFAZOLIN SODIUM-DEXTROSE 2-4 GM/100ML-% IV SOLN
INTRAVENOUS | Status: AC
  Filled 2020-06-05: qty 100

## 2020-06-05 MED ORDER — SODIUM CHLORIDE 0.9 % IR SOLN
Status: DC | PRN
  Administered 2020-06-05: 1000 mL

## 2020-06-05 MED ORDER — ONDANSETRON HCL 4 MG/2ML IJ SOLN: 4.0000 mg | Freq: Four times a day (QID) | INTRAMUSCULAR | Status: DC | PRN

## 2020-06-05 MED ORDER — PROPOFOL 10 MG/ML IV BOLUS
INTRAVENOUS | Status: DC | PRN
  Administered 2020-06-05: 20 mg via INTRAVENOUS
  Administered 2020-06-05: 10 mg via INTRAVENOUS

## 2020-06-05 MED ORDER — SENNA 8.6 MG PO TABS
1.0000 | ORAL_TABLET | Freq: Two times a day (BID) | ORAL | Status: DC
  Administered 2020-06-05: 8.6 mg via ORAL
  Filled 2020-06-05: qty 1

## 2020-06-05 MED ORDER — FENTANYL CITRATE (PF) 100 MCG/2ML IJ SOLN
INTRAMUSCULAR | Status: AC
  Filled 2020-06-05: qty 2

## 2020-06-05 MED ORDER — PROPOFOL 1000 MG/100ML IV EMUL
INTRAVENOUS | Status: AC
  Filled 2020-06-05: qty 100

## 2020-06-05 MED ORDER — BUPIVACAINE-EPINEPHRINE (PF) 0.25% -1:200000 IJ SOLN
INTRAMUSCULAR | Status: AC
  Filled 2020-06-05: qty 30

## 2020-06-05 MED ORDER — AMISULPRIDE (ANTIEMETIC) 5 MG/2ML IV SOLN: 10.0000 mg | Freq: Once | INTRAVENOUS | Status: DC | PRN

## 2020-06-05 MED ORDER — ISOPROPYL ALCOHOL 70 % SOLN
Status: AC
  Filled 2020-06-05: qty 480

## 2020-06-05 MED ORDER — POVIDONE-IODINE 10 % EX SWAB: 2.0000 "application " | Freq: Once | CUTANEOUS | Status: DC

## 2020-06-05 MED ORDER — FENTANYL CITRATE (PF) 250 MCG/5ML IJ SOLN
INTRAMUSCULAR | Status: DC | PRN
  Administered 2020-06-05 (×2): 25 ug via INTRAVENOUS

## 2020-06-05 MED ORDER — METOCLOPRAMIDE HCL 5 MG/ML IJ SOLN: 5.0000 mg | Freq: Three times a day (TID) | INTRAMUSCULAR | Status: DC | PRN

## 2020-06-05 MED ORDER — LACTATED RINGERS IV SOLN: INTRAVENOUS | Status: DC

## 2020-06-05 MED ORDER — KETOROLAC TROMETHAMINE 30 MG/ML IJ SOLN
INTRAMUSCULAR | Status: AC
  Filled 2020-06-05: qty 1

## 2020-06-05 MED ORDER — PHENYLEPHRINE HCL-NACL 10-0.9 MG/250ML-% IV SOLN
INTRAVENOUS | Status: DC | PRN
  Administered 2020-06-05: 25 ug/min via INTRAVENOUS

## 2020-06-05 MED ORDER — ACETAMINOPHEN 500 MG PO TABS: 1000.0000 mg | ORAL_TABLET | Freq: Once | ORAL | Status: DC

## 2020-06-05 MED ORDER — ENOXAPARIN SODIUM 40 MG/0.4ML ~~LOC~~ SOLN
40.0000 mg | SUBCUTANEOUS | Status: DC
  Administered 2020-06-06 – 2020-06-10 (×5): 40 mg via SUBCUTANEOUS
  Filled 2020-06-05 (×5): qty 0.4

## 2020-06-05 MED ORDER — BUPIVACAINE-EPINEPHRINE 0.25% -1:200000 IJ SOLN
INTRAMUSCULAR | Status: DC | PRN
  Administered 2020-06-05: 30 mL

## 2020-06-05 MED ORDER — WATER FOR IRRIGATION, STERILE IR SOLN
Status: DC | PRN
  Administered 2020-06-05: 1000 mL

## 2020-06-05 MED ORDER — TRANEXAMIC ACID-NACL 1000-0.7 MG/100ML-% IV SOLN
INTRAVENOUS | Status: AC
  Filled 2020-06-05: qty 100

## 2020-06-05 MED ORDER — FENTANYL CITRATE (PF) 100 MCG/2ML IJ SOLN: 25.0000 ug | INTRAMUSCULAR | Status: DC | PRN

## 2020-06-05 MED ORDER — SODIUM CHLORIDE (PF) 0.9 % IJ SOLN
INTRAMUSCULAR | Status: DC | PRN
  Administered 2020-06-05: 30 mL

## 2020-06-05 MED ORDER — KETOROLAC TROMETHAMINE 30 MG/ML IJ SOLN
INTRAMUSCULAR | Status: DC | PRN
  Administered 2020-06-05: 30 mg

## 2020-06-05 MED ORDER — PHENOL 1.4 % MT LIQD: 1.0000 | OROMUCOSAL | Status: DC | PRN

## 2020-06-05 MED ORDER — PROPOFOL 500 MG/50ML IV EMUL: INTRAVENOUS | Status: DC | PRN

## 2020-06-05 MED ORDER — CEFAZOLIN SODIUM-DEXTROSE 2-3 GM-%(50ML) IV SOLR
INTRAVENOUS | Status: DC | PRN
  Administered 2020-06-05: 2 g via INTRAVENOUS

## 2020-06-05 MED ORDER — SUCCINYLCHOLINE CHLORIDE 200 MG/10ML IV SOSY
PREFILLED_SYRINGE | INTRAVENOUS | Status: AC
  Filled 2020-06-05: qty 10

## 2020-06-05 MED ORDER — ONDANSETRON HCL 4 MG/2ML IJ SOLN
INTRAMUSCULAR | Status: AC
  Filled 2020-06-05: qty 4

## 2020-06-05 MED ORDER — SODIUM CHLORIDE (PF) 0.9 % IJ SOLN
INTRAMUSCULAR | Status: AC
  Filled 2020-06-05: qty 30

## 2020-06-05 MED ORDER — TRANEXAMIC ACID-NACL 1000-0.7 MG/100ML-% IV SOLN
INTRAVENOUS | Status: DC | PRN
  Administered 2020-06-05: 1000 mg via INTRAVENOUS

## 2020-06-05 MED ORDER — ONDANSETRON HCL 4 MG PO TABS: 4.0000 mg | ORAL_TABLET | Freq: Four times a day (QID) | ORAL | Status: DC | PRN

## 2020-06-05 MED ORDER — PROPOFOL 500 MG/50ML IV EMUL
INTRAVENOUS | Status: DC | PRN
  Administered 2020-06-05: 50 ug/kg/min via INTRAVENOUS

## 2020-06-05 MED ORDER — PROPOFOL 10 MG/ML IV BOLUS
INTRAVENOUS | Status: AC
  Filled 2020-06-05: qty 20

## 2020-06-05 MED ORDER — ALBUMIN HUMAN 5 % IV SOLN
INTRAVENOUS | Status: AC
  Filled 2020-06-05: qty 250

## 2020-06-05 MED ORDER — CEFAZOLIN SODIUM-DEXTROSE 2-4 GM/100ML-% IV SOLN: 2.0000 g | INTRAVENOUS | Status: DC

## 2020-06-05 MED ORDER — TRANEXAMIC ACID-NACL 1000-0.7 MG/100ML-% IV SOLN: 1000.0000 mg | INTRAVENOUS | Status: DC

## 2020-06-05 MED ORDER — ONDANSETRON HCL 4 MG/2ML IJ SOLN
INTRAMUSCULAR | Status: DC | PRN
  Administered 2020-06-05: 4 mg via INTRAVENOUS

## 2020-06-05 MED ORDER — SODIUM CHLORIDE 0.9 % IV SOLN: INTRAVENOUS | Status: DC

## 2020-06-05 SURGICAL SUPPLY — 65 items
ADH SKN CLS APL DERMABOND .7 (GAUZE/BANDAGES/DRESSINGS) ×1
APL PRP STRL LF DISP 70% ISPRP (MISCELLANEOUS) ×1
BAG DECANTER FOR FLEXI CONT (MISCELLANEOUS) IMPLANT
BAG SPEC THK2 15X12 ZIP CLS (MISCELLANEOUS)
BAG ZIPLOCK 12X15 (MISCELLANEOUS) IMPLANT
BLADE SURG SZ10 CARB STEEL (BLADE) IMPLANT
CHLORAPREP W/TINT 26 (MISCELLANEOUS) ×2 IMPLANT
COVER PERINEAL POST (MISCELLANEOUS) ×2 IMPLANT
COVER SURGICAL LIGHT HANDLE (MISCELLANEOUS) ×2 IMPLANT
COVER WAND RF STERILE (DRAPES) IMPLANT
DECANTER SPIKE VIAL GLASS SM (MISCELLANEOUS) ×2 IMPLANT
DERMABOND ADVANCED (GAUZE/BANDAGES/DRESSINGS) ×1
DERMABOND ADVANCED .7 DNX12 (GAUZE/BANDAGES/DRESSINGS) ×2 IMPLANT
DRAPE IMP U-DRAPE 54X76 (DRAPES) ×2 IMPLANT
DRAPE SHEET LG 3/4 BI-LAMINATE (DRAPES) ×6 IMPLANT
DRAPE STERI IOBAN 125X83 (DRAPES) IMPLANT
DRAPE U-SHAPE 47X51 STRL (DRAPES) ×4 IMPLANT
DRSG AQUACEL AG ADV 3.5X10 (GAUZE/BANDAGES/DRESSINGS) ×2 IMPLANT
ELECT REM PT RETURN 15FT ADLT (MISCELLANEOUS) ×2 IMPLANT
GAUZE SPONGE 4X4 12PLY STRL (GAUZE/BANDAGES/DRESSINGS) ×2 IMPLANT
GLOVE BIO SURGEON STRL SZ8.5 (GLOVE) ×4 IMPLANT
GLOVE BIOGEL PI IND STRL 8.5 (GLOVE) ×1 IMPLANT
GLOVE BIOGEL PI INDICATOR 8.5 (GLOVE) ×1
GLOVE SRG 8 PF TXTR STRL LF DI (GLOVE) ×1 IMPLANT
GLOVE SURG ENC TEXT LTX SZ7.5 (GLOVE) ×4 IMPLANT
GLOVE SURG UNDER POLY LF SZ8 (GLOVE) ×2
GOWN SPEC L3 XXLG W/TWL (GOWN DISPOSABLE) ×2 IMPLANT
GOWN STRL REUS W/ TWL LRG LVL3 (GOWN DISPOSABLE) ×1 IMPLANT
GOWN STRL REUS W/TWL LRG LVL3 (GOWN DISPOSABLE) ×2
HANDPIECE INTERPULSE COAX TIP (DISPOSABLE) ×2
HEAD M SROM 36MM PLUS 1.5 (Hips) IMPLANT
HOLDER FOLEY CATH W/STRAP (MISCELLANEOUS) ×2 IMPLANT
HOOD PEEL AWAY FLYTE STAYCOOL (MISCELLANEOUS) ×8 IMPLANT
JET LAVAGE IRRISEPT WOUND (IRRIGATION / IRRIGATOR) ×2
KIT TURNOVER KIT A (KITS) IMPLANT
LAVAGE JET IRRISEPT WOUND (IRRIGATION / IRRIGATOR) ×1 IMPLANT
LINER ACETAB NEUTRAL 36ID 520D (Liner) ×1 IMPLANT
MANIFOLD NEPTUNE II (INSTRUMENTS) ×2 IMPLANT
MARKER SKIN DUAL TIP RULER LAB (MISCELLANEOUS) ×2 IMPLANT
NDL SAFETY ECLIPSE 18X1.5 (NEEDLE) ×1 IMPLANT
NDL SPNL 18GX3.5 QUINCKE PK (NEEDLE) ×1 IMPLANT
NEEDLE HYPO 18GX1.5 SHARP (NEEDLE) ×2
NEEDLE SPNL 18GX3.5 QUINCKE PK (NEEDLE) ×2 IMPLANT
PACK ANTERIOR HIP CUSTOM (KITS) ×2 IMPLANT
PENCIL SMOKE EVACUATOR (MISCELLANEOUS) IMPLANT
PIN SECTOR W/GRIP ACE CUP 52MM (Hips) ×1 IMPLANT
SAW OSC TIP CART 19.5X105X1.3 (SAW) ×2 IMPLANT
SEALER BIPOLAR AQUA 6.0 (INSTRUMENTS) ×2 IMPLANT
SET HNDPC FAN SPRY TIP SCT (DISPOSABLE) ×1 IMPLANT
SROM M HEAD 36MM PLUS 1.5 (Hips) ×2 IMPLANT
STEM TRI LOC BPS GRIP SZ9 OFFS IMPLANT
SUT ETHIBOND NAB CT1 #1 30IN (SUTURE) ×4 IMPLANT
SUT MNCRL AB 3-0 PS2 18 (SUTURE) ×2 IMPLANT
SUT MNCRL AB 4-0 PS2 18 (SUTURE) ×2 IMPLANT
SUT MON AB 2-0 CT1 36 (SUTURE) ×4 IMPLANT
SUT STRATAFIX PDO 1 14 VIOLET (SUTURE) ×2
SUT STRATFX PDO 1 14 VIOLET (SUTURE) ×1
SUT VIC AB 2-0 CT1 27 (SUTURE) ×2
SUT VIC AB 2-0 CT1 TAPERPNT 27 (SUTURE) ×1 IMPLANT
SUTURE STRATFX PDO 1 14 VIOLET (SUTURE) ×1 IMPLANT
SYR 3ML LL SCALE MARK (SYRINGE) ×2 IMPLANT
TRAY FOLEY MTR SLVR 16FR STAT (SET/KITS/TRAYS/PACK) IMPLANT
TRI LOC BPS W/GRIP SZ 9 OFFS ×2 IMPLANT
TUBE SUCTION HIGH CAP CLEAR NV (SUCTIONS) ×2 IMPLANT
WATER STERILE IRR 1000ML POUR (IV SOLUTION) ×2 IMPLANT

## 2020-06-05 NOTE — Progress Notes (Signed)
Initial Nutrition Assessment  DOCUMENTATION CODES:   Not applicable  INTERVENTION:  - diet re-advancement as medically feasible. - continue Glucerna Shake BID, each supplement provides 220 kcal and 10 grams of protein. - complete NFPE when feasible.  NUTRITION DIAGNOSIS:   Increased nutrient needs related to post-op healing as evidenced by estimated needs.  GOAL:   Patient will meet greater than or equal to 90% of their needs  MONITOR:   PO intake,Supplement acceptance,Labs,Weight trends,Skin  REASON FOR ASSESSMENT:   Consult Hip fracture protocol  ASSESSMENT:   87 y.o. female with medical history of HTN, HLD, benign meningioma (s/p resection), osteoarthritis, vitamin B12 deficiency, vitamin D deficiency, R breast cancer (s/p lumpectomy and radiation), seizure, anxiety, and insulin-dependent DM. She presented to the ED following a mechanical fall at home with subsequent severe R hip pain and inability to bear weight.  Diet changed from Heart Healthy/Carb Modified to NPO at midnight; no intakes of lunch or dinner documented yesterday.   Patient is currently out of the room to OR; transport occurred at ~1030 this AM.   Patient was last seen by a [place] RD on 03/20/20 at which time patient was eating 25-50% at meals and accepting Glucerna Shake ~75% of the times offered.   Weight today is 138 lb and weight on 04/02/20 was 143 lb. This indicates 5 lb weight loss (3.5% body weight) in the past 2 months; not significant for time frame.    Labs reviewed; CBG: 96 mg/dl.  Medications reviewed; sliding scale novolog, 75 mcg oral synthroid/day, 40 mg oral protonix/day, 250 mg thiamine/day. IVF; NS @ 75 ml/hr.     NUTRITION - FOCUSED PHYSICAL EXAM:  unable to complete at this time.   Diet Order:   Diet Order            Diet NPO time specified Except for: Sips with Meds  Diet effective now                 EDUCATION NEEDS:   No education needs have been identified  at this time  Skin:  Skin Assessment: Reviewed RN Assessment  Last BM:  2/1  Height:   Ht Readings from Last 1 Encounters:  06/05/20 5\' 6"  (1.676 m)    Weight:   Wt Readings from Last 1 Encounters:  06/05/20 62.5 kg     Estimated Nutritional Needs:  Kcal:  1550-1750 kcal Protein:  70-80 grams Fluid:  >/= 1.7 L/day      Jarome Matin, MS, RD, LDN, CNSC Inpatient Clinical Dietitian RD pager # available in [facility]  After hours/weekend pager # available in [facility]

## 2020-06-05 NOTE — Anesthesia Procedure Notes (Signed)
Date/Time: 06/05/2020 11:39 AM Performed by: Cynda Familia, CRNA Pre-anesthesia Checklist: Patient identified, Emergency Drugs available, Suction available, Patient being monitored and Timeout performed Patient Re-evaluated:Patient Re-evaluated prior to induction Oxygen Delivery Method: Simple face mask Placement Confirmation: positive ETCO2 and breath sounds checked- equal and bilateral Dental Injury: Teeth and Oropharynx as per pre-operative assessment

## 2020-06-05 NOTE — Op Note (Signed)
OPERATIVE REPORT  SURGEON: Rod Can, MD   ASSISTANT: Cherlynn June, PA-C  PREOPERATIVE DIAGNOSIS: Displaced Right femoral neck fracture.   POSTOPERATIVE DIAGNOSIS: Displaced Right femoral neck fracture.   PROCEDURE: Right total hip arthroplasty, anterior approach.   IMPLANTS: DePuy Tri Lock stem, size 9, hi offset. DePuy Pinnacle Cup, size 52 mm. DePuy Altrx liner, size 36 by 52 mm, neutral. DePuy metal head ball, size 36 + 1.5 mm.  ANESTHESIA:  MAC and Spinal  ANTIBIOTICS: 2g ancef.  ESTIMATED BLOOD LOSS:-150 mL    DRAINS: None.  COMPLICATIONS: None   CONDITION: PACU - hemodynamically stable.   BRIEF CLINICAL NOTE: [name] is a 87 y.o. female with a displaced Right femoral neck fracture. The patient was admitted to the hospitalist service and underwent perioperative risk stratification and medical optimization. The risks, benefits, and alternatives to total hip arthroplasty were explained, and the patient elected to proceed.  PROCEDURE IN DETAIL: The patient was taken to the operating room and general anesthesia was induced on the hospital bed.  The patient was then positioned on the [place]ana table.  All bony prominences were well padded.  The hip was prepped and draped in the normal sterile surgical fashion.  A time-out was called verifying side and site of surgery. Antibiotics were given within 60 minutes of beginning the procedure.  The direct anterior approach to the hip was performed through the Hueter interval.  Lateral femoral circumflex vessels were treated with the Auqumantys. The anterior capsule was exposed and an inverted T capsulotomy was made.  Fracture hematoma was encountered and evacuated. The patient was found to have a comminuted Right subcapital femoral neck fracture.  I freshened the femoral neck cut with a saw.  I removed the femoral neck fragment.  A corkscrew was placed into the head and the head was removed.  This was passed to the back table and  was measured.   Acetabular exposure was achieved, and the pulvinar and labrum were excised. Sequential reaming of the acetabulum was then performed up to a size 51 mm reamer. A 52 mm cup was then opened and impacted into place at approximately 40 degrees of abduction and 20 degrees of anteversion. The final polyethylene liner was impacted into place.    I then gained femoral exposure taking care to protect the abductors and greater trochanter.  This was performed using standard external rotation, extension, and adduction.  The capsule was peeled off the inner aspect of the greater trochanter, taking care to preserve the short external rotators. A cookie cutter was used to enter the femoral canal, and then the femoral canal finder was used to confirm location.  I then sequentially broached up to a size 9.  Calcar planer was used on the femoral neck remnant.  I paced a hi neck and a trial head ball. The hip was reduced.  Leg lengths were checked fluoroscopically.  The hip was dislocated and trial components were removed.  I placed the real stem followed by the real spacer and head ball.  The hip was reduced.  Fluoroscopy was used to confirm component position and leg lengths.  At 90 degrees of external rotation and extension, the hip was stable to an anterior directed force.   The wound was copiously irrigated with Irrisept solution and normal saline using pule lavage.  Marcaine solution was injected into the periarticular soft tissue.  The wound was closed in layers using #1 Vicryl and V-Loc for the fascia, 2-0 Vicryl for the subcutaneous  fat, 2-0 Monocryl for the deep dermal layer, 3-0 running Monocryl subcuticular stitch and glue for the skin.  Once the glue was fully dried, an Aquacell Ag dressing was applied.  The patient was then awakened from anesthesia and transported to the recovery room in stable condition.  Sponge, needle, and instrument counts were correct at the end of the case x2.  The patient  tolerated the procedure well and there were no known complications.  Please note that a surgical assistant was a medical necessity for this procedure to perform it in a safe and expeditious manner. Assistant was necessary to provide appropriate retraction of vital neurovascular structures, to prevent femoral fracture, and to allow for anatomic placement of the prosthesis.

## 2020-06-05 NOTE — Progress Notes (Signed)
PROGRESS NOTE    [name]  [id number] DOB: May 11, 1929 DOA: 06/04/2020 PCP: Bartholome Bill, MD    Chief Complaint  Patient presents with  . Fall    Brief Narrative:  Patient is a pleasant 87 year old female history of hypertension, hyperlipidemia, benign meningioma status post resection, OA, B12 deficiency, vitamin D deficiency, history of right breast cancer status post lumpectomy and radiation, seizure, anxiety, insulin-dependent diabetes presented to the hospital after mechanical fall sustaining a right hip fracture with complaints of severe right hip pain and inability to bear weight.  Head CT done unremarkable.  Plain films of the right hip and pelvis with a displaced right femoral neck fracture.  Orthopedics consulted and plan is for surgical repair today 06/05/2020.   Assessment & Plan:   Principal Problem:   Closed right hip fracture (HCC) Active Problems:   Insulin-requiring or dependent type II diabetes mellitus ([place])   Essential hypertension   GERD   Anxiety   Seizure disorder (HCC)   Tremor, essential   Leukocytosis  1 closed right hip fracture Secondary to mechanical fall. Orthopedics consultation pending and plan is for surgical repair this morning 06/05/2020. -PT/OT evaluation post surgical repair.  [place] need SNF postop. -Vitamin D 25 hydroxy 47.59. -Continue current pain management. -Placed on scheduled Tylenol 500 mg 3 times daily.  2.  Well-controlled insulin-dependent diabetes mellitus type 2 Hemoglobin A1c 6.4.  CBG 96 this morning.  Discontinue Levemir and can resume once tolerating oral intake.  Sliding scale insulin.  3.  Hypothyroidism Continue home regimen Synthroid.  4.  Hypertension BP stable.  Continue home regimen Mavik and verapamil.  5.  Gastroesophageal reflux disease PPI.  6.  Depression/anxiety Continue Lexapro.  As needed anxiolytics per home regimen.  7.  Seizure disorder Stable.  No seizures noted.  Continue  Dilantin.  8.  Essential tremor Continue primidone.  9.  Leukocytosis Likely reactive leukocytosis secondary to problem #1.  Patient afebrile.  No urinary symptoms.  Chest x-ray unremarkable.  Urinalysis unremarkable.  Leukocytosis trending down.  Hold off on any antibiotics at this time.  Follow.   DVT prophylaxis: SCDs.  Postop per orthopedics. Code Status: Full Family Communication: Updated sister at bedside Disposition:   Status is: Inpatient    Dispo: The patient is from: Home              Anticipated d/c is to: Likely SNF versus home with home health              Anticipated d/c date is: 2 to 3 days.              Patient currently awaiting to undergo right hip surgery after right hip fracture.  Not stable for discharge.   Difficult to place patient on the term       Consultants:   Orthopedics pending  Procedures  Plain films of the right hip and pelvis 06/04/2020  Plain films of the right knee 06/04/2020  Chest x-ray 06/04/2020  CT head 06/04/2020  Antimicrobials:   None   Subjective: Laying in bed.  Complains of right hip pain.  Denies any chest pain.  No shortness of breath.  No abdominal pain.  States supposed to go for surgery this morning.  Sister and chaplain at bedside.  Objective: Vitals:   06/04/20 2040 06/05/20 0043 06/05/20 0447 06/05/20 1003  BP: 131/71 (!) 108/55 131/60 120/64  Pulse: 91 85 74 84  Resp: 18 16 20 18   Temp: 98.8 F (37.1  C) 97.8 F (36.6 C) 98.1 F (36.7 C) 98.3 F (36.8 C)  TempSrc: Oral Oral Oral Oral  SpO2: 99% 97% 96% 99%  Weight:  62.5 kg    Height:  5\' 6"  (1.676 m)      Intake/Output Summary (Last 24 hours) at 06/05/2020 1036 Last data filed at 06/05/2020 0600 Gross per 24 hour  Intake 5578.47 ml  Output 550 ml  Net 5028.47 ml   Filed Weights   06/05/20 0043  Weight: 62.5 kg    Examination:  General exam: Appears calm and comfortable  Respiratory system: Clear to auscultation anterior lung fields.  Respiratory effort normal. Cardiovascular system: S1 & S2 heard, RRR. No JVD, murmurs, rubs, gallops or clicks. No pedal edema. Gastrointestinal system: Abdomen is nondistended, soft and nontender. No organomegaly or masses felt. Normal bowel sounds heard. Central nervous system: Alert and oriented. No focal neurological deficits. Extremities: Right lower extremity externally rotated and shortened.  Ice pack on right hip.  Skin: No rashes, lesions or ulcers Psychiatry: Judgement and insight appear normal. Mood & affect appropriate.     Data Reviewed: I have personally reviewed following labs and imaging studies  CBC: Recent Labs  Lab 06/04/20 1152 06/04/20 1911  WBC 16.1* 12.4*  NEUTROABS 14.4*  --   HGB 11.9* 11.5*  HCT 36.1 35.0*  MCV 92.1 92.1  PLT 249 0000000    Basic Metabolic Panel: Recent Labs  Lab 06/04/20 1152 06/04/20 1911  NA 138  --   K 4.6  --   CL 105  --   CO2 21*  --   GLUCOSE 135*  --   BUN 14  --   CREATININE 0.83 0.66  CALCIUM 8.9  --     GFR: Estimated Creatinine Clearance: 45.5 mL/min (by C-G formula based on SCr of 0.66 mg/dL).  Liver Function Tests: No results for input(s): AST, ALT, ALKPHOS, BILITOT, PROT, ALBUMIN in the last 168 hours.  CBG: Recent Labs  Lab 06/04/20 1732 06/04/20 2044 06/05/20 0808  GLUCAP 122* 97 96     Recent Results (from the past 240 hour(s))  SARS CORONAVIRUS 2 (TAT 6-24 HRS) Nasopharyngeal Nasopharyngeal Swab     Status: None   Collection Time: 06/04/20 12:04 PM   Specimen: Nasopharyngeal Swab  Result Value Ref Range Status   SARS Coronavirus 2 NEGATIVE NEGATIVE Final    Comment: (NOTE) SARS-CoV-2 target nucleic acids are NOT DETECTED.  The SARS-CoV-2 RNA is generally detectable in upper and lower respiratory specimens during the acute phase of infection. Negative results do not preclude SARS-CoV-2 infection, do not rule out co-infections with other pathogens, and should not be used as the sole basis for  treatment or other patient management decisions. Negative results must be combined with clinical observations, patient history, and epidemiological information. The expected result is Negative.  Fact Sheet for Patients: SugarRoll.be  Fact Sheet for Healthcare Providers: https://www.woods-mathews.com/  This test is not yet approved or cleared by the [place] FDA and  has been authorized for detection and/or diagnosis of SARS-CoV-2 by FDA under an Emergency Use Authorization (EUA). This EUA will remain  in effect (meaning this test can be used) for the duration of the COVID-19 declaration under Se ction 564(b)(1) of the Act, 21 U.S.C. section 360bbb-3(b)(1), unless the authorization is terminated or revoked sooner.  Performed at [facility], [place] [address] [place], [place] [address]   Surgical pcr screen     Status: None   Collection Time: 06/05/20  3:25 AM   Specimen: Nasal Mucosa; Nasal Swab  Result Value Ref Range Status   MRSA, PCR NEGATIVE NEGATIVE Final   Staphylococcus aureus NEGATIVE NEGATIVE Final    Comment: (NOTE) The Xpert SA Assay (FDA approved for NASAL specimens in patients 24 years of age and older), is one component of a comprehensive surveillance program. It is not intended to diagnose infection nor to guide or monitor treatment. Performed at [facility], [place] [address], [place], [place] [address]          Radiology Studies: CT Head Wo Contrast  Result Date: 06/04/2020 CLINICAL DATA:  Unwitnessed fall. EXAM: CT HEAD WITHOUT CONTRAST TECHNIQUE: Contiguous axial images were obtained from the base of the skull through the vertex without intravenous contrast. COMPARISON:  April 21, 2020. FINDINGS: Brain: Stable right frontal encephalomalacia consistent with postoperative change. No mass effect or midline shift is noted. Ventricular size is within normal limits. There is no evidence of  mass lesion, hemorrhage or acute infarction. Vascular: No hyperdense vessel or unexpected calcification. Skull: Status post right frontal craniotomy. No acute abnormality is noted. Sinuses/Orbits: No acute finding. Other: None. IMPRESSION: Stable right frontal encephalomalacia consistent with postoperative change. No acute intracranial abnormality seen. Electronically Signed   By: Marijo Conception M.D.   On: 06/04/2020 12:18   DG Chest Port 1 View  Result Date: 06/04/2020 CLINICAL DATA:  Fall.  Fractured hip.  Preop. EXAM: PORTABLE CHEST 1 VIEW COMPARISON:  04/01/2020 FINDINGS: Mild right hemidiaphragm elevation. Numerous leads and wires project over the chest. Midline trachea. Normal heart size. No pleural effusion or pneumothorax. Clear lungs. IMPRESSION: No acute cardiopulmonary disease. Electronically Signed   By: Abigail Miyamoto M.D.   On: 06/04/2020 12:59   DG Knee Right Port  Result Date: 06/04/2020 CLINICAL DATA:  Right femoral neck fracture EXAM: PORTABLE RIGHT KNEE - 1-2 VIEW COMPARISON:  None. FINDINGS: Alignment is anatomic. There is no acute fracture. No joint effusion. Mild degenerative changes are present. IMPRESSION: No acute fracture. Electronically Signed   By: Macy Mis M.D.   On: 06/04/2020 17:05   US THYROID  Result Date: 06/03/2020 CLINICAL DATA:  Prior ultrasound follow-up. EXAM: THYROID ULTRASOUND TECHNIQUE: Ultrasound examination of the thyroid gland and adjacent soft tissues was performed. COMPARISON:  04/24/2014 FINDINGS: Parenchymal Echotexture: Moderately heterogenous Isthmus: 0.2 cm, previously 0.2 cm Right lobe: 4.5 x 1.1 x 1.3 cm, previously 4.7 x 1.4 x 1.9 cm Left lobe: 4.4 x 1.6 x 2.3 cm, previously 4.4 x 1.9 x 2.6 cm _________________________________________________________ Estimated total number of nodules >/= 1 cm: 2 Number of spongiform nodules >/=  2 cm not described below (TR1): 0 Number of mixed cystic and solid nodules >/= 1.5 cm not described below ([place]): 0  _________________________________________________________ Nodule # 1: Prior biopsy: No Location: Right; Mid Maximum size: 1.0 cm; Other 2 dimensions: 0.9 x 0.5 cm, previously, 1.7 x 1.7 x 0.8 cm Composition: solid/almost completely solid (2) Echogenicity: hypoechoic (2) Shape: not taller-than-wide (0) Margins: ill-defined (0) Echogenic foci: none (0) ACR TI-RADS total points: 4. ACR TI-RADS risk category:  TR4 (4-6 points). Significant change in size (>/= 20% in two dimensions and minimal increase of 2 mm): No Change in features: Yes Change in ACR TI-RADS risk category: Yes ACR TI-RADS recommendations: This nodule does NOT meet TI-RADS criteria for biopsy or dedicated follow-up given 6 years stability. _________________________________________________________ Nodule # 2: Prior biopsy: No Location: Left; Superior Maximum size: 0.8 cm; Other 2 dimensions: 0.6 x 0.4 cm,  previously, 0.9 x 0.6 x 0.5 cm Composition: solid/almost completely solid (2) Echogenicity: hypoechoic (2) Shape: not taller-than-wide (0) Margins: smooth (0) Echogenic foci: none (0) ACR TI-RADS total points: 4. ACR TI-RADS risk category:  TR4 (4-6 points). Significant change in size (>/= 20% in two dimensions and minimal increase of 2 mm): No Change in features: No Change in ACR TI-RADS risk category: No ACR TI-RADS recommendations: Given size (<0.9 cm) and appearance, this nodule does NOT meet TI-RADS criteria for biopsy or dedicated follow-up. _________________________________________________________ Nodule # 3: Prior biopsy: No Location: Left; Mid Maximum size: 2.5 cm; Other 2 dimensions: 2.4 x 1.4 cm, previously, 2.4 x 2.3 x 1.3 cm Composition: mixed cystic and solid (1) Echogenicity: isoechoic (1) Shape: not taller-than-wide (0) Margins: smooth (0) Echogenic foci: none (0) ACR TI-RADS total points: 2. ACR TI-RADS risk category:  TR2 (2 points). Significant change in size (>/= 20% in two dimensions and minimal increase of 2 mm): No Change in  features: No Change in ACR TI-RADS risk category: No ACR TI-RADS recommendations: This nodule does NOT meet TI-RADS criteria for biopsy or dedicated follow-up. _________________________________________________________ IMPRESSION: 1. Similar-appearing multinodular goiter. 2. The dominant nodule in the left mid thyroid (labeled 3, 2.5 cm, TI-RADS category 2) remains benign in appearance. No dedicated ultrasound follow-up or tissue sampling is recommended. 3. Decreased size of previously visualized right mid thyroid nodule (labeled 1, TI-RADS category 4) from 1.7 cm to 1.0 cm, now declared benign. No dedicated ultrasound follow-up or tissue sampling is recommended. The above is in keeping with the ACR TI-RADS recommendations - J Am Coll Radiol 2017;14:587-595. Marliss Coots, MD [facility] Radiology Electronically Signed   By: Marliss Coots MD   On: 06/03/2020 12:33   DG Hip Unilat With Pelvis 2-3 Views Right  Result Date: 06/04/2020 CLINICAL DATA:  Right hip pain after fall today. EXAM: DG HIP (WITH OR WITHOUT PELVIS) 2-3V RIGHT COMPARISON:  None. FINDINGS: Moderately displaced proximal right femoral neck fracture is noted. IMPRESSION: Moderately displaced proximal right femoral neck fracture. Electronically Signed   By: Lupita Raider M.D.   On: 06/04/2020 12:31        Scheduled Meds: . [MAR Hold] cholestyramine light  2 g Oral BID  . [MAR Hold] escitalopram  10 mg Oral Daily  . [MAR Hold] feeding supplement (GLUCERNA SHAKE)  237 mL Oral BID  . [MAR Hold] insulin aspart  0-5 Units Subcutaneous QHS  . [MAR Hold] insulin aspart  0-9 Units Subcutaneous TID WC  . [MAR Hold] levothyroxine  75 mcg Oral Q0600  . lip balm      . [MAR Hold] methocarbamol  500 mg Oral Once  . [MAR Hold] phenytoin  100 mg Oral q morning - 10a  . [MAR Hold] phenytoin  200 mg Oral QHS  . [MAR Hold] polyvinyl alcohol  2 drop Both Eyes Daily  . [MAR Hold] primidone  100 mg Oral  QHS  . [MAR Hold] primidone  150 mg Oral q morning - 10a  . [MAR Hold] vitamin B-1  250 mg Oral QHS  . [MAR Hold] trandolapril  2 mg Oral QHS   And  . [MAR Hold] verapamil  240 mg Oral QHS   Continuous Infusions: . sodium chloride 75 mL/hr at 06/05/20 0936  . vancomycin Stopped (06/05/20 0600)     LOS: 1 day    Time spent: 35 minutes    Ramiro Harvest, MD [facility]   To contact the attending provider  between 7A-7P or the covering provider during after hours 7P-7A, please log into the web site [email or web address] and access using universal [place] password for that web site. If you do not have the password, please call the hospital operator.  06/05/2020, 10:36 AM

## 2020-06-05 NOTE — Consult Note (Signed)
Reason for Consult:Right hip fx Referring Physician: Isla Pence Time called: 2/1@1332   Time at bedside: 1030   [name] is an 87 y.o. female.  HPI: Tabby was at home. She's not sure what happened but she fell and hurt her right hip. She could not get up or bear weight. She was brought to the ED where x-rays showed a right hip fx and orthopedic surgery was consulted. She lives at home alone and ambulates with a RW. She c/o localized pain to the hip.  Past Medical History:  Diagnosis Date  . Anemia   . Anxiety   . Asthma   . Breast cancer (HCC) 2014   right breast  . Cancer of right breast (HCC) 12/26/12   right breast 12:00 o'clock, DCIS  . Carotid artery disease (HCC)   . Carpal tunnel syndrome, bilateral   . Chronic bronchitis (HCC)   . Chronic cough   . Chronic facial pain   . Chronic foot pain   . Colon cancer (HCC) dx'd 11/2019  . Complication of anesthesia    Sore jaw; could not chew or move mouth, prolonged sedation  . Convulsions/seizures (HCC) 10/16/2014  . Diabetes mellitus    type 2 niddm x 20 years  . Dyslipidemia   . Ejection fraction   . Gait abnormality 12/04/2019  . GERD (gastroesophageal reflux disease)   . Hammer toe    bilateral  . History of colonic polyps   . History of meningioma   . HTN (hypertension)   . Hx of radiation therapy 03/07/13- 03/29/13   right breast 4250 cGy 17 sessions  . Hyperlipidemia   . Hypokalemia   . Hyponatremia   . Hypothyroidism   . IBS (irritable bowel syndrome)   . Melanoma (HCC)   . Metatarsal bone fracture 2014  . Multiple drug allergies   . Nontoxic thyroid nodule   . Obesity   . Osteoarthritis   . Osteoporosis   . Palpitations   . Personal history of radiation therapy 2014  . Seasonal allergies   . Skin cancer   . Syncope   . Tremor, essential 08/18/2016  . Vitamin B12 deficiency   . Vitamin D deficiency     Past Surgical History:  Procedure Laterality Date  . ABDOMINAL HYSTERECTOMY    . BIOPSY  11/07/2019    Procedure: BIOPSY;  Surgeon: Bernette Redbird, MD;  Location: [facility];  Service: Endoscopy;;  . BRAIN SURGERY    . BREAST BIOPSY Right 01/24/2013   Procedure: RE-EXCICION OF BREAST CANCER, ANTERIOR MARGINS;  Surgeon: Mariella Saa, MD;  Location: [facility];  Service: General;  Laterality: Right;  . BREAST LUMPECTOMY Right 2014  . BREAST LUMPECTOMY WITH NEEDLE LOCALIZATION Right 01/17/2013   Procedure: BREAST LUMPECTOMY WITH NEEDLE LOCALIZATION;  Surgeon: Mariella Saa, MD;  Location: [facility];  Service: General;  Laterality: Right;  . BUNIONECTOMY Bilateral   . CATARACT EXTRACTION W/ INTRAOCULAR LENS IMPLANT Right   . CHOLECYSTECTOMY    . COLONOSCOPY    . COLONOSCOPY WITH PROPOFOL N/A 11/07/2019   Procedure: COLONOSCOPY WITH PROPOFOL;  Surgeon: Bernette Redbird, MD;  [facility];  Service: Endoscopy;  Laterality: N/A;  . CRANIOTOMY Right 10/18/2013   Procedure: CRANIOTOMY TUMOR EXCISION;  Surgeon: Karn Cassis, MD;  Location: [facility];  Service: Neurosurgery;  Laterality: Right;  . ESOPHAGOGASTRODUODENOSCOPY (EGD) WITH PROPOFOL N/A 11/07/2019   Procedure: ESOPHAGOGASTRODUODENOSCOPY (EGD) WITH PROPOFOL;  Surgeon: Bernette Redbird, MD;  Location[facility];  Service: Endoscopy;  Laterality: N/A;  . EYE SURGERY    . HERNIA REPAIR    . IR THORACENTESIS ASP PLEURAL SPACE W/IMG GUIDE  04/01/2020  . KNEE ARTHROSCOPY Bilateral   . LAPAROSCOPIC RIGHT HEMI COLECTOMY Right 03/06/2020   Procedure: LAPAROSCOPIC RIGHT HEMI COLECTOMY WITH TAP BLOCK AND LYSIS OF ADHESIONS;  Surgeon: Andria Meuse, MD;  Location: [facility];  Service: General;  Laterality: Right;  . POLYPECTOMY     small adenomatous  . ULNAR TUNNEL RELEASE      Family History  Problem Relation Age of Onset  . Heart disease Mother   . Osteoporosis Mother   . Diabetes Father   . Pancreatic cancer Father   . Colon cancer Other   . Bone cancer Sister   . Prostate cancer Brother   . Colon cancer Brother   .  Rectal cancer Sister   . Thyroid disease Sister        benign goiter resected    Social History:  reports that she has never smoked. She has never used smokeless tobacco. She reports that she does not drink alcohol and does not use drugs.  Allergies:  Allergies  Allergen Reactions  . Bystolic [Nebivolol Hcl] Other (See Comments)    "extreme weakness, heaviness in legs & arms, swelling in legs/arms/face, swollen abdomen, pain in bladder, feet pain, soreness in chest"  . Carbamazepine Other (See Comments)    Blood poisoning  Other reaction(s): Unknown  . Cholestyramine Other (See Comments)    "itching rash on stomach, bloated, nausea, vomiting, sleeplessness, extreme pain in arms"  . Hydrazine Yellow [Tartrazine] Other (See Comments)    "does not reduce high blood pressure, pain in arm, high pressure, felt like I was on verge of heart attack, really weak"  . Morphine Other (See Comments)    Feels morbid, weak, still in pain  . Niacin Palpitations    Fast heart beat Other reaction(s): Unknown  . Niaspan [Niacin Er] Palpitations and Other (See Comments)    "fast heart beat, high blood pressure"  . Norvasc [Amlodipine Besylate] Other (See Comments)    "extreme fluid retention/pain)  . Optivar [Azelastine Hcl] Photosensitivity  . Repaglinide Hives  . Sular [Nisoldipine Er] Other (See Comments)    "severe headaches, swelling eyes, hands, feet, shortness of breath, weak, flushed face, brain boiling, fluid retention, high blood sugar, nervous, heart fast beating"  . Telmisartan Other (See Comments)    "headache, difficulty urinating, high blood sugar, fluid retention"  . Amoxicillin-Pot Clavulanate Rash and Other (See Comments)  . Cefdinir Swelling    Vaginal irritation, breathing,   . Ciprofloxacin Hcl Hives  . Clonidine Other (See Comments)    Dry mouth, fluid retention  . Clonidine Hydrochloride Other (See Comments)    Dry mouth, fluid retention  . Codeine Nausea And Vomiting  .  Ezetimibe Other (See Comments)    Made weak Other reaction(s): Unknown  . Hydroxychloroquine Other (See Comments)    Low platlets  . Naproxen Other (See Comments)    Shrinks bladder  . Sulfa Antibiotics Rash  . Ziac [Bisoprolol-Hydrochlorothiazide] Other (See Comments)    "stopped urination"  . Amlodipine Besy-Benazepril Hcl     Other reaction(s): cough  . Azelastine Other (See Comments)    Unknown reaction - Per MAR  . Elavil [Amitriptyline] Other (See Comments)    Gave Pt nightmares  . Empagliflozin Other (See Comments)    "Caused yeast infection, slowed my urine"  . Hydralazine Other (See Comments)    Unknown  reaction - Per MAR  . Iodine I 131 Tositumomab Other (See Comments)    Unknown reaction - Per MAR  . Kenalog [Triamcinolone] Diarrhea    Per MAR  . Keppra [Levetiracetam] Other (See Comments)    Shaking  . Lamotrigine Itching and Other (See Comments)  . Norvasc [Amlodipine] Other (See Comments)    Unknown reaction - Per MAR  . Other Other (See Comments)    Other reaction(s): Unknown Other reaction(s): Unknown Other reaction(s): Unknown Other reaction(s): Unknown Other reaction(s): Unknown  . Pregabalin Swelling    Other reaction(s): weight gain  . Pseudoephedrine   . Pseudoephedrine-Guaifenesin Er Other (See Comments)    Unknown reaction - Per MAR  . Risedronate     Unknown reaction - Per MAR  . Ru-Hist D [Brompheniramine-Phenylephrine] Other (See Comments)    Unknown reaction - Per MAR  . Sumatriptan Other (See Comments)  . Topiramate Other (See Comments)    Dry eyes Other reaction(s): Unknown  . Ace Inhibitors Other (See Comments)    unknown  . Actonel [Risedronate Sodium] Other (See Comments)    unknown  . Amlodipine Besylate Other (See Comments)    unknown  . Aspirin Other (See Comments)    unknown  . Atacand [Candesartan] Other (See Comments)    Unknown reaction - MAR  . Bextra [Valdecoxib] Other (See Comments)    Unknown reaction - Per MAR  .  Bisoprolol-Hydrochlorothiazide Other (See Comments)    Unknown reaction - Per MAR  . Candesartan Cilexetil Other (See Comments)    unknown  . Cefadroxil Other (See Comments)    unknown  . Celecoxib Rash  . Hydrocodone Other (See Comments)    unknown  . Hydrocodone-Acetaminophen Other (See Comments)    unknown  . Iodinated Diagnostic Agents Rash    "All over"  Other reaction(s): Unknown  . Meloxicam Other (See Comments)    unknown  . Methylprednisolone Sodium Succinate Other (See Comments)    unknown  . Nabumetone Other (See Comments)    Unknown reaction  . Penicillins Other (See Comments)    unknown  . Pseudoephedrine-Guaifenesin Other (See Comments)    unknown  . Risedronate Sodium Other (See Comments)    unknown  . Rofecoxib Other (See Comments)    Unknown reaction - MAR Other reaction(s): Unknown  . Ru-Tuss [Chlorphen-Pse-Atrop-Hyos-Scop] Other (See Comments)    unknown  . Sulfonamide Derivatives Other (See Comments)    unknown  . Sulphur [Elemental Sulfur] Other (See Comments)    unknown  . Telithromycin Other (See Comments)    unknown  . Terfenadine Other (See Comments)    Unknown reaction - Per MAR  . Trandolapril-Verapamil Hcl Er Other (See Comments)    Headache, difficulty urinating, high blood sugar, fluid retention  Pt is taking Tarka (trandolapril-verapamil) currently, but requests the medication stay in her allergy list  . Trandolapril-Verapamil Hcl Er Other (See Comments)    Headache, difficulty urinating, high blood sugar, fluid retention  Pt is taking Tarka (trandolapril-verapamil) currently, but requests the medication stay in her allergy list  . Valium [Diazepam] Other (See Comments)    Makes her mean and hyper    Medications: I have reviewed the patient's current medications.  Results for orders placed or performed during the hospital encounter of 06/04/20 (from the past 48 hour(s))  Basic metabolic panel     Status: Abnormal   Collection  Time: 06/04/20 11:52 AM  Result Value Ref Range   Sodium 138 135 - 145 mmol/L  Potassium 4.6 3.5 - 5.1 mmol/L   Chloride 105 98 - 111 mmol/L   CO2 21 (L) 22 - 32 mmol/L   Glucose, Bld 135 (H) 70 - 99 mg/dL    Comment: Glucose reference range applies only to samples taken after fasting for at least 8 hours.   BUN 14 8 - 23 mg/dL   Creatinine, Ser 0.83 0.44 - 1.00 mg/dL   Calcium 8.9 8.9 - 10.3 mg/dL   GFR, Estimated >60 >60 mL/min    Comment: (NOTE) Calculated using the CKD-EPI Creatinine Equation (2021)    Anion gap 12 5 - 15    Comment: Performed at [facility], [place] [address] [place], [place] [address]  CBC WITH DIFFERENTIAL     Status: Abnormal   Collection Time: 06/04/20 11:52 AM  Result Value Ref Range   WBC 16.1 (H) 4.0 - 10.5 K/uL   RBC 3.92 3.87 - 5.11 MIL/uL   Hemoglobin 11.9 (L) 12.0 - 15.0 g/dL   HCT 36.1 36.0 - 46.0 %   MCV 92.1 80.0 - 100.0 fL   MCH 30.4 26.0 - 34.0 pg   MCHC 33.0 30.0 - 36.0 g/dL   RDW 16.4 (H) 11.5 - 15.5 %   Platelets 249 150 - 400 K/uL   nRBC 0.0 0.0 - 0.2 %   Neutrophils Relative % 88 %   Neutro Abs 14.4 (H) 1.7 - 7.7 K/uL   Lymphocytes Relative 7 %   Lymphs Abs 1.1 0.7 - 4.0 K/uL   Monocytes Relative 4 %   Monocytes Absolute 0.6 0.1 - 1.0 K/uL   Eosinophils Relative 0 %   Eosinophils Absolute 0.0 0.0 - 0.5 K/uL   Basophils Relative 0 %   Basophils Absolute 0.0 0.0 - 0.1 K/uL   Immature Granulocytes 1 %   Abs Immature Granulocytes 0.11 (H) 0.00 - 0.07 K/uL    Comment: Performed at [facility], [place] [address], [place], [place] [address]  Protime-INR     Status: None   Collection Time: 06/04/20 11:52 AM  Result Value Ref Range   Prothrombin Time 13.7 11.4 - 15.2 seconds   INR 1.1 0.8 - 1.2    Comment: (NOTE) INR goal varies based on device and disease states. Performed at [facility], [place] [address] [place], [place] [address]   Type and screen [place]     Status: None   Collection Time: 06/04/20 11:52 AM  Result Value Ref Range   ABO/RH(D) A POS    Antibody Screen NEG    Sample Expiration      06/07/2020,2359 Performed at [facility], [place] [address] [place], [place] [address]   SARS CORONAVIRUS 2 (TAT 6-24 HRS) Nasopharyngeal Nasopharyngeal Swab     Status: None   Collection Time: 06/04/20 12:04 PM   Specimen: Nasopharyngeal Swab  Result Value Ref Range   SARS Coronavirus 2 NEGATIVE NEGATIVE    Comment: (NOTE) SARS-CoV-2 target nucleic acids are NOT DETECTED.  The SARS-CoV-2 RNA is generally detectable in upper and lower respiratory specimens during the acute phase of infection. Negative results do not preclude SARS-CoV-2 infection, do not rule out co-infections with other pathogens, and should not be used as the sole basis for treatment or other patient management decisions. Negative results must be combined with clinical observations, patient history, and epidemiological information. The expected result is Negative.  Fact Sheet for Patients: SugarRoll.be  Fact Sheet for Healthcare Providers: https://www.woods-mathews.com/  This  test is not yet approved or cleared by the [place] and  has been authorized for detection and/or diagnosis of SARS-CoV-2 by FDA under an Emergency Use Authorization (EUA). This EUA will remain  in effect (meaning this test can be used) for the duration of the COVID-19 declaration under Se ction 564(b)(1) of the Act, 21 U.S.C. section 360bbb-3(b)(1), unless the authorization is terminated or revoked sooner.  Performed at [facility], [place] [address], [place], [place] [address]   Urinalysis, Routine w reflex microscopic Urine, Clean Catch     Status: Abnormal   Collection Time: 06/04/20  3:33 PM  Result Value Ref Range   Color, Urine STRAW (A) YELLOW   APPearance CLEAR CLEAR   Specific Gravity, Urine 1.010  1.005 - 1.030   pH 5.0 5.0 - 8.0   Glucose, UA NEGATIVE NEGATIVE mg/dL   Hgb urine dipstick NEGATIVE NEGATIVE   Bilirubin Urine NEGATIVE NEGATIVE   Ketones, ur NEGATIVE NEGATIVE mg/dL   Protein, ur NEGATIVE NEGATIVE mg/dL   Nitrite NEGATIVE NEGATIVE   Leukocytes,Ua NEGATIVE NEGATIVE    Comment: Performed at [place] [address], [place], [place] [address]  CBG monitoring, ED     Status: Abnormal   Collection Time: 06/04/20  5:32 PM  Result Value Ref Range   Glucose-Capillary 122 (H) 70 - 99 mg/dL    Comment: Glucose reference range applies only to samples taken after fasting for at least 8 hours.  CBC     Status: Abnormal   Collection Time: 06/04/20  7:11 PM  Result Value Ref Range   WBC 12.4 (H) 4.0 - 10.5 K/uL   RBC 3.80 (L) 3.87 - 5.11 MIL/uL   Hemoglobin 11.5 (L) 12.0 - 15.0 g/dL   HCT 35.0 (L) 36.0 - 46.0 %   MCV 92.1 80.0 - 100.0 fL   MCH 30.3 26.0 - 34.0 pg   MCHC 32.9 30.0 - 36.0 g/dL   RDW 16.4 (H) 11.5 - 15.5 %   Platelets 246 150 - 400 K/uL   nRBC 0.0 0.0 - 0.2 %    Comment: Performed at [facility], [place] [address] [place], [place] [address]  Creatinine, serum     Status: None   Collection Time: 06/04/20  7:11 PM  Result Value Ref Range   Creatinine, Ser 0.66 0.44 - 1.00 mg/dL   GFR, Estimated >60 >60 mL/min    Comment: (NOTE) Calculated using the CKD-EPI Creatinine Equation (2021) Performed at [facility], [place] [address] [place], [place] [address]   VITAMIN D 25 Hydroxy (Vit-D Deficiency, Fractures)     Status: None   Collection Time: 06/04/20  7:11 PM  Result Value Ref Range   Vit D, 25-Hydroxy 47.59 30 - 100 ng/mL    Comment: (NOTE) Vitamin D deficiency has been defined by the [place] practice guideline as a level of serum 25-OH  vitamin D less than 20 ng/mL (1,2). The Endocrine Society went on to  further define vitamin D insufficiency as a level  between 21 and 29  ng/mL (2).  1. IOM (Institute of Medicine). 2010. Dietary reference intakes for  calcium and D. [place]: The [employer]. 2. Holick MF, Binkley [place], Bischoff-Ferrari HA, et al. Evaluation,  treatment, and prevention of vitamin D deficiency: an Endocrine  Society clinical practice guideline, JCEM. 2011 Jul; 96(7): 1911-30.  Performed at [facility], [place] [address],  [place], [place] [address]   Vitamin B12     Status: Abnormal   Collection Time: 06/04/20  7:11 PM  Result Value Ref Range   Vitamin B-12 2,750 (H) 180 - 914 pg/mL    Comment: RESULTS CONFIRMED BY MANUAL DILUTION (NOTE) This assay is not validated for testing neonatal or myeloproliferative syndrome specimens for Vitamin B12 levels. Performed at [facility], [place] [address], [place], [place] [address]   Hemoglobin A1c     Status: Abnormal   Collection Time: 06/04/20  7:11 PM  Result Value Ref Range   Hgb A1c MFr Bld 6.4 (H) 4.8 - 5.6 %    Comment: (NOTE) Pre diabetes:          5.7%-6.4%  Diabetes:              >6.4%  Glycemic control for   <7.0% adults with diabetes    Mean Plasma Glucose 136.98 mg/dL    Comment: Performed at [facility] [address] [place], [place] [address]  Glucose, capillary     Status: None   Collection Time: 06/04/20  8:44 PM  Result Value Ref Range   Glucose-Capillary 97 70 - 99 mg/dL    Comment: Glucose reference range applies only to samples taken after fasting for at least 8 hours.  Surgical pcr screen     Status: None   Collection Time: 06/05/20  3:25 AM   Specimen: Nasal Mucosa; Nasal Swab  Result Value Ref Range   MRSA, PCR NEGATIVE NEGATIVE   Staphylococcus aureus NEGATIVE NEGATIVE    Comment: (NOTE) The Xpert SA Assay (FDA approved for NASAL specimens in patients 18 years of age and older), is one component of a comprehensive surveillance program. It is not intended to diagnose infection nor to guide  or monitor treatment. Performed at [facility], [place] [address] [place], [place] [address]   Glucose, capillary     Status: None   Collection Time: 06/05/20  8:08 AM  Result Value Ref Range   Glucose-Capillary 96 70 - 99 mg/dL    Comment: Glucose reference range applies only to samples taken after fasting for at least 8 hours.    CT Head Wo Contrast  Result Date: 06/04/2020 CLINICAL DATA:  Unwitnessed fall. EXAM: CT HEAD WITHOUT CONTRAST TECHNIQUE: Contiguous axial images were obtained from the base of the skull through the vertex without intravenous contrast. COMPARISON:  April 21, 2020. FINDINGS: Brain: Stable right frontal encephalomalacia consistent with postoperative change. No mass effect or midline shift is noted. Ventricular size is within normal limits. There is no evidence of mass lesion, hemorrhage or acute infarction. Vascular: No hyperdense vessel or unexpected calcification. Skull: Status post right frontal craniotomy. No acute abnormality is noted. Sinuses/Orbits: No acute finding. Other: None. IMPRESSION: Stable right frontal encephalomalacia consistent with postoperative change. No acute intracranial abnormality seen. Electronically Signed   By: Marijo Conception M.D.   On: 06/04/2020 12:18   DG Chest Port 1 View  Result Date: 06/04/2020 CLINICAL DATA:  Fall.  Fractured hip.  Preop. EXAM: PORTABLE CHEST 1 VIEW COMPARISON:  04/01/2020 FINDINGS: Mild right hemidiaphragm elevation. Numerous leads and wires project over the chest. Midline trachea. Normal heart size. No pleural effusion or pneumothorax. Clear lungs. IMPRESSION: No acute cardiopulmonary disease. Electronically Signed   By: Abigail Miyamoto M.D.   On: 06/04/2020 12:59   DG Knee Right Port  Result Date: 06/04/2020 CLINICAL DATA:  Right femoral neck fracture EXAM: PORTABLE RIGHT  KNEE - 1-2 VIEW COMPARISON:  None. FINDINGS: Alignment is anatomic. There is no acute fracture. No joint effusion. Mild  degenerative changes are present. IMPRESSION: No acute fracture. Electronically Signed   By: Guadlupe Spanish M.D.   On: 06/04/2020 17:05   US THYROID  Result Date: 06/03/2020 CLINICAL DATA:  Prior ultrasound follow-up. EXAM: THYROID ULTRASOUND TECHNIQUE: Ultrasound examination of the thyroid gland and adjacent soft tissues was performed. COMPARISON:  04/24/2014 FINDINGS: Parenchymal Echotexture: Moderately heterogenous Isthmus: 0.2 cm, previously 0.2 cm Right lobe: 4.5 x 1.1 x 1.3 cm, previously 4.7 x 1.4 x 1.9 cm Left lobe: 4.4 x 1.6 x 2.3 cm, previously 4.4 x 1.9 x 2.6 cm _________________________________________________________ Estimated total number of nodules >/= 1 cm: 2 Number of spongiform nodules >/=  2 cm not described below (TR1): 0 Number of mixed cystic and solid nodules >/= 1.5 cm not described below (TR2): 0 _________________________________________________________ Nodule # 1: Prior biopsy: No Location: Right; Mid Maximum size: 1.0 cm; Other 2 dimensions: 0.9 x 0.5 cm, previously, 1.7 x 1.7 x 0.8 cm Composition: solid/almost completely solid (2) Echogenicity: hypoechoic (2) Shape: not taller-than-wide (0) Margins: ill-defined (0) Echogenic foci: none (0) ACR TI-RADS total points: 4. ACR TI-RADS risk category:  TR4 (4-6 points). Significant change in size (>/= 20% in two dimensions and minimal increase of 2 mm): No Change in features: Yes Change in ACR TI-RADS risk category: Yes ACR TI-RADS recommendations: This nodule does NOT meet TI-RADS criteria for biopsy or dedicated follow-up given 6 years stability. _________________________________________________________ Nodule # 2: Prior biopsy: No Location: Left; Superior Maximum size: 0.8 cm; Other 2 dimensions: 0.6 x 0.4 cm, previously, 0.9 x 0.6 x 0.5 cm Composition: solid/almost completely solid (2) Echogenicity: hypoechoic (2) Shape: not taller-than-wide (0) Margins: smooth (0) Echogenic foci: none (0) ACR TI-RADS total points: 4. ACR TI-RADS risk  category:  TR4 (4-6 points). Significant change in size (>/= 20% in two dimensions and minimal increase of 2 mm): No Change in features: No Change in ACR TI-RADS risk category: No ACR TI-RADS recommendations: Given size (<0.9 cm) and appearance, this nodule does NOT meet TI-RADS criteria for biopsy or dedicated follow-up. _________________________________________________________ Nodule # 3: Prior biopsy: No Location: Left; Mid Maximum size: 2.5 cm; Other 2 dimensions: 2.4 x 1.4 cm, previously, 2.4 x 2.3 x 1.3 cm Composition: mixed cystic and solid (1) Echogenicity: isoechoic (1) Shape: not taller-than-wide (0) Margins: smooth (0) Echogenic foci: none (0) ACR TI-RADS total points: 2. ACR TI-RADS risk category:  TR2 (2 points). Significant change in size (>/= 20% in two dimensions and minimal increase of 2 mm): No Change in features: No Change in ACR TI-RADS risk category: No ACR TI-RADS recommendations: This nodule does NOT meet TI-RADS criteria for biopsy or dedicated follow-up. _________________________________________________________ IMPRESSION: 1. Similar-appearing multinodular goiter. 2. The dominant nodule in the left mid thyroid (labeled 3, 2.5 cm, TI-RADS category 2) remains benign in appearance. No dedicated ultrasound follow-up or tissue sampling is recommended. 3. Decreased size of previously visualized right mid thyroid nodule (labeled 1, TI-RADS category 4) from 1.7 cm to 1.0 cm, now declared benign. No dedicated ultrasound follow-up or tissue sampling is recommended. The above is in keeping with the ACR TI-RADS recommendations - J Am Coll Radiol 2017;14:587-595. Marliss Coots, MD [facility] Electronically Signed   By: Marliss Coots MD   On: 06/03/2020 12:33   DG Hip Unilat With Pelvis 2-3 Views Right  Result Date: 06/04/2020 CLINICAL DATA:  Right hip  pain after fall today. EXAM: DG HIP (WITH OR WITHOUT PELVIS) 2-3V RIGHT COMPARISON:  None.  FINDINGS: Moderately displaced proximal right femoral neck fracture is noted. IMPRESSION: Moderately displaced proximal right femoral neck fracture. Electronically Signed   By: Lupita Raider M.D.   On: 06/04/2020 12:31    Review of Systems  HENT: Negative for ear discharge, ear pain, hearing loss and tinnitus.   Eyes: Negative for photophobia and pain.  Respiratory: Negative for cough and shortness of breath.   Cardiovascular: Negative for chest pain.  Gastrointestinal: Negative for abdominal pain, nausea and vomiting.  Genitourinary: Negative for dysuria, flank pain, frequency and urgency.  Musculoskeletal: Positive for arthralgias (Right hip). Negative for back pain, myalgias and neck pain.  Neurological: Negative for dizziness and headaches.  Hematological: Does not bruise/bleed easily.  Psychiatric/Behavioral: The patient is not nervous/anxious.    Blood pressure 120/64, pulse 84, temperature 98.3 F (36.8 C), temperature source Oral, resp. rate 18, height 5\' 6"  (1.676 m), weight 62.5 kg, SpO2 99 %. Physical Exam Constitutional:      General: She is not in acute distress.    Appearance: She is well-developed and well-nourished. She is not diaphoretic.  HENT:     Head: Normocephalic and atraumatic.  Eyes:     General: No scleral icterus.       Right eye: No discharge.        Left eye: No discharge.     Conjunctiva/sclera: Conjunctivae normal.  Cardiovascular:     Rate and Rhythm: Normal rate and regular rhythm.  Pulmonary:     Effort: Pulmonary effort is normal. No respiratory distress.  Musculoskeletal:     Cervical back: Normal range of motion.     Comments: LLE No traumatic wounds, ecchymosis, or rash  Severe TTP hip  No knee or ankle effusion  Knee stable to varus/ valgus and anterior/posterior stress  Sens DPN, SPN, TN intact  Motor EHL, ext, flex, evers 5/5  DP 1+, PT 1+, No significant edema  Skin:    General: Skin is warm and dry.  Neurological:     Mental  Status: She is alert.  Psychiatric:        Mood and Affect: Mood and affect normal.        Behavior: Behavior normal.     Assessment/Plan: Right hip fx -- Plan hip hemi today by Dr. Linna Caprice. Please keep NPO. Multiple medical problems including hypertension, hyperlipidemia, history of benign meningioma (status post resection), osteoarthritis, B12 deficiency, vitamin D deficiency, history of right breast cancer (status post lumpectomy and radiation), seizure, anxiety and insulin-dependent diabetes -- per primary service, appreciate their help    Freeman Caldron, PA-C Orthopedic Surgery [phone] 06/05/2020, 10:41 AM

## 2020-06-05 NOTE — Consult Note (Signed)
ORTHOPAEDIC CONSULTATION  REQUESTING PHYSICIAN: Eugenie Filler, MD  PCP:  Bartholome Bill, MD  Chief Complaint: Right hip injury  HPI: [name] is a 87 y.o. female with a past medical history of hypertension, hyperlipidemia, osteoarthritis, B12 deficiency, vitamin D deficiency, history of right breast cancer status post lumpectomy and radiation, seizure, anxiety disorder, insulin-dependent diabetes, and benign meningioma status post resection had a mechanical ground-level fall at home and injured her right hip.  She had right hip pain and inability to weight-bear.  She was brought to the emergency department at [facility], where x-rays revealed a displaced right femoral neck fracture.  She was admitted to the hospitalist service for perioperative or stratification and medical optimization.  Orthopedic consultation was placed for management of her hip fracture.  She denies other injuries.  She had a recent stay in a skilled nursing facility following hospital admission.  She returned to home last week.  She ambulates with a walker.  Past Medical History:  Diagnosis Date  . Anemia   . Anxiety   . Asthma   . Breast cancer ([place]) 2014   right breast  . Cancer of right breast ([place]) 12/26/12   right breast 12:00 o'clock, DCIS  . Carotid artery disease ([place])   . Carpal tunnel syndrome, bilateral   . Chronic bronchitis ([place])   . Chronic cough   . Chronic facial pain   . Chronic foot pain   . Colon cancer ([place]) dx'd 11/2019  . Complication of anesthesia    Sore jaw; could not chew or move mouth, prolonged sedation  . Convulsions/seizures ([place]) 10/16/2014  . Diabetes mellitus    type 2 niddm x 20 years  . Dyslipidemia   . Ejection fraction   . Gait abnormality 12/04/2019  . GERD (gastroesophageal reflux disease)   . Hammer toe    bilateral  . History of colonic polyps   . History of meningioma   . HTN (hypertension)   . Hx of radiation therapy 03/07/13- 03/29/13    right breast 4250 cGy 17 sessions  . Hyperlipidemia   . Hypokalemia   . Hyponatremia   . Hypothyroidism   . IBS (irritable bowel syndrome)   . Melanoma ([place])   . Metatarsal bone fracture 2014  . Multiple drug allergies   . Nontoxic thyroid nodule   . Obesity   . Osteoarthritis   . Osteoporosis   . Palpitations   . Personal history of radiation therapy 2014  . Seasonal allergies   . Skin cancer   . Syncope   . Tremor, essential 08/18/2016  . Vitamin B12 deficiency   . Vitamin D deficiency    Past Surgical History:  Procedure Laterality Date  . ABDOMINAL HYSTERECTOMY    . BIOPSY  11/07/2019   Procedure: BIOPSY;  Surgeon: Ronald Lobo, MD;  Location: [facility];  Service: Endoscopy;;  . BRAIN SURGERY    . BREAST BIOPSY Right 01/24/2013   Procedure: RE-EXCICION OF BREAST CANCER, ANTERIOR MARGINS;  Surgeon: Edward Jolly, MD;  Location: [facility];  Service: General;  Laterality: Right;  . BREAST LUMPECTOMY Right 2014  . BREAST LUMPECTOMY WITH NEEDLE LOCALIZATION Right 01/17/2013   Procedure: BREAST LUMPECTOMY WITH NEEDLE LOCALIZATION;  Surgeon: Edward Jolly, MD;  Location: [facility];  Service: General;  Laterality: Right;  . BUNIONECTOMY Bilateral   . CATARACT EXTRACTION W/ INTRAOCULAR LENS IMPLANT Right   . CHOLECYSTECTOMY    . COLONOSCOPY    . COLONOSCOPY WITH  PROPOFOL N/A 11/07/2019   Procedure: COLONOSCOPY WITH PROPOFOL;  Surgeon: Ronald Lobo, MD;  Location: [facility];  Service: Endoscopy;  Laterality: N/A;  . CRANIOTOMY Right 10/18/2013   Procedure: CRANIOTOMY TUMOR EXCISION;  Surgeon: Floyce Stakes, MD;  Location: [facility];  Service: Neurosurgery;  Laterality: Right;  . ESOPHAGOGASTRODUODENOSCOPY (EGD) WITH PROPOFOL N/A 11/07/2019   Procedure: ESOPHAGOGASTRODUODENOSCOPY (EGD) WITH PROPOFOL;  Surgeon: Ronald Lobo, MD;  Location: [facility];  Service: Endoscopy;  Laterality: N/A;  . EYE SURGERY    . HERNIA REPAIR    . IR THORACENTESIS ASP PLEURAL SPACE  W/IMG GUIDE  04/01/2020  . KNEE ARTHROSCOPY Bilateral   . LAPAROSCOPIC RIGHT HEMI COLECTOMY Right 03/06/2020   Procedure: LAPAROSCOPIC RIGHT HEMI COLECTOMY WITH TAP BLOCK AND LYSIS OF ADHESIONS;  Surgeon: Ileana Roup, MD;  Location: [facility];  Service: General;  Laterality: Right;  . POLYPECTOMY     small adenomatous  . ULNAR TUNNEL RELEASE     Social History   Socioeconomic History  . Marital status: Widowed    Spouse name: Not on file  . Number of children: 0  . Years of education: college  . Highest education level: Not on file  Occupational History  . Occupation: retired  Tobacco Use  . Smoking status: Never Smoker  . Smokeless tobacco: Never Used  Vaping Use  . Vaping Use: Never used  Substance and Sexual Activity  . Alcohol use: No  . Drug use: No  . Sexual activity: Yes    Comment: menarche age 70, hysterectomy age 43, HRT x 2-3 mos, G1- miscarriage  Other Topics Concern  . Not on file  Social History Narrative   Widowed, lives alone.  Ambulates independently, uses walker at home intermittently    Drinks decaf only   Patient is right handed.   Social Determinants of Health   Financial Resource Strain: Not on file  Food Insecurity: Not on file  Transportation Needs: Not on file  Physical Activity: Not on file  Stress: Not on file  Social Connections: Not on file   Family History  Problem Relation Age of Onset  . Heart disease Mother   . Osteoporosis Mother   . Diabetes Father   . Pancreatic cancer Father   . Colon cancer Other   . Bone cancer Sister   . Prostate cancer Brother   . Colon cancer Brother   . Rectal cancer Sister   . Thyroid disease Sister        benign goiter resected   Allergies  Allergen Reactions  . Bystolic [Nebivolol Hcl] Other (See Comments)    "extreme weakness, heaviness in legs & arms, swelling in legs/arms/face, swollen abdomen, pain in bladder, feet pain, soreness in chest"  . Carbamazepine Other (See Comments)     Blood poisoning  Other reaction(s): Unknown  . Cholestyramine Other (See Comments)    "itching rash on stomach, bloated, nausea, vomiting, sleeplessness, extreme pain in arms"  . Hydrazine Yellow [Tartrazine] Other (See Comments)    "does not reduce high blood pressure, pain in arm, high pressure, felt like I was on verge of heart attack, really weak"  . Morphine Other (See Comments)    Feels morbid, weak, still in pain  . Niacin Palpitations    Fast heart beat Other reaction(s): Unknown  . Niaspan [Niacin Er] Palpitations and Other (See Comments)    "fast heart beat, high blood pressure"  . Norvasc [Amlodipine Besylate] Other (See Comments)    "extreme fluid retention/pain)  .  Optivar [Azelastine Hcl] Photosensitivity  . Repaglinide Hives  . Sular [Nisoldipine Er] Other (See Comments)    "severe headaches, swelling eyes, hands, feet, shortness of breath, weak, flushed face, brain boiling, fluid retention, high blood sugar, nervous, heart fast beating"  . Telmisartan Other (See Comments)    "headache, difficulty urinating, high blood sugar, fluid retention"  . Amoxicillin-Pot Clavulanate Rash and Other (See Comments)  . Cefdinir Swelling    Vaginal irritation, breathing,   . Ciprofloxacin Hcl Hives  . Clonidine Other (See Comments)    Dry mouth, fluid retention  . Clonidine Hydrochloride Other (See Comments)    Dry mouth, fluid retention  . Codeine Nausea And Vomiting  . Ezetimibe Other (See Comments)    Made weak Other reaction(s): Unknown  . Hydroxychloroquine Other (See Comments)    Low platlets  . Naproxen Other (See Comments)    Shrinks bladder  . Sulfa Antibiotics Rash  . Ziac [Bisoprolol-Hydrochlorothiazide] Other (See Comments)    "stopped urination"  . Amlodipine Besy-Benazepril Hcl     Other reaction(s): cough  . Azelastine Other (See Comments)    Unknown reaction - Per MAR  . Elavil [Amitriptyline] Other (See Comments)    Gave Pt nightmares  . Empagliflozin  Other (See Comments)    "Caused yeast infection, slowed my urine"  . Hydralazine Other (See Comments)    Unknown reaction - Per MAR  . Iodine I 131 Tositumomab Other (See Comments)    Unknown reaction - Per MAR  . Kenalog [Triamcinolone] Diarrhea    Per MAR  . Keppra [Levetiracetam] Other (See Comments)    Shaking  . Lamotrigine Itching and Other (See Comments)  . Norvasc [Amlodipine] Other (See Comments)    Unknown reaction - Per MAR  . Other Other (See Comments)    Other reaction(s): Unknown Other reaction(s): Unknown Other reaction(s): Unknown Other reaction(s): Unknown Other reaction(s): Unknown  . Pregabalin Swelling    Other reaction(s): weight gain  . Pseudoephedrine   . Pseudoephedrine-Guaifenesin Er Other (See Comments)    Unknown reaction - Per MAR  . Risedronate     Unknown reaction - Per MAR  . Ru-Hist D [Brompheniramine-Phenylephrine] Other (See Comments)    Unknown reaction - Per MAR  . Sumatriptan Other (See Comments)  . Topiramate Other (See Comments)    Dry eyes Other reaction(s): Unknown  . Ace Inhibitors Other (See Comments)    unknown  . Actonel [Risedronate Sodium] Other (See Comments)    unknown  . Amlodipine Besylate Other (See Comments)    unknown  . Aspirin Other (See Comments)    unknown  . Atacand [Candesartan] Other (See Comments)    Unknown reaction - MAR  . Bextra [Valdecoxib] Other (See Comments)    Unknown reaction - Per MAR  . Bisoprolol-Hydrochlorothiazide Other (See Comments)    Unknown reaction - Per MAR  . Candesartan Cilexetil Other (See Comments)    unknown  . Cefadroxil Other (See Comments)    unknown  . Celecoxib Rash  . Hydrocodone Other (See Comments)    unknown  . Hydrocodone-Acetaminophen Other (See Comments)    unknown  . Iodinated Diagnostic Agents Rash    "All over"  Other reaction(s): Unknown  . Meloxicam Other (See Comments)    unknown  . Methylprednisolone Sodium Succinate Other (See Comments)    unknown   . Nabumetone Other (See Comments)    Unknown reaction  . Penicillins Other (See Comments)    unknown  . Pseudoephedrine-Guaifenesin Other (See Comments)  unknown  . Risedronate Sodium Other (See Comments)    unknown  . Rofecoxib Other (See Comments)    Unknown reaction - MAR Other reaction(s): Unknown  . Ru-Tuss [Chlorphen-Pse-Atrop-Hyos-Scop] Other (See Comments)    unknown  . Sulfonamide Derivatives Other (See Comments)    unknown  . Sulphur [Elemental Sulfur] Other (See Comments)    unknown  . Telithromycin Other (See Comments)    unknown  . Terfenadine Other (See Comments)    Unknown reaction - Per MAR  . Trandolapril-Verapamil Hcl Er Other (See Comments)    Headache, difficulty urinating, high blood sugar, fluid retention  Pt is taking Tarka (trandolapril-verapamil) currently, but requests the medication stay in her allergy list  . Trandolapril-Verapamil Hcl Er Other (See Comments)    Headache, difficulty urinating, high blood sugar, fluid retention  Pt is taking Tarka (trandolapril-verapamil) currently, but requests the medication stay in her allergy list  . Valium [Diazepam] Other (See Comments)    Makes her mean and hyper   Prior to Admission medications   Medication Sig Start Date End Date Taking? Authorizing Provider  ALPRAZolam (XANAX) 0.25 MG tablet TAKE 1 TABLET AT BEDTIME AS NEEDED (CAN TAKE 1 TABLET DURING THE DAY IF NEEDED FOR ANXIETY) Patient taking differently: Take 0.25 mg by mouth at bedtime as needed. TAKE 1 TABLET AT BEDTIME AS NEEDED (CAN TAKE 1 TABLET DURING THE DAY IF NEEDED FOR ANXIETY) 04/02/20  Yes Danford, Suann Larry, MD  Biotin 1000 MCG tablet Take 1,000 mcg by mouth daily.   Yes [provider]  Blood Glucose Monitoring Suppl (PRODIGY VOICE BLOOD GLUCOSE) w/Device KIT Use to check blood sugar 1 time per day. 10/18/15  Yes Renato Shin, MD  Cholecalciferol (VITAMIN D-3) 125 MCG (5000 UT) TABS Take 5,000 Units by mouth daily.   Yes  [provider]  cholestyramine (QUESTRAN) 4 g packet Take 4 g by mouth as needed (diarrhea).   Yes [provider]  cyanocobalamin (,VITAMIN B-12,) 1000 MCG/ML injection INJECT 1 ML INTRAMUSCULARLY EVERY 21 DAYS Patient taking differently: Inject 1,000 mcg into the muscle every 21 ( twenty-one) days. 06/18/16  Yes Hoyt Koch, MD  diclofenac Sodium (VOLTAREN) 1 % GEL Apply 2 g topically as needed (knee pain). 06/01/19  Yes [provider]  escitalopram (LEXAPRO) 10 MG tablet Take 10 mg by mouth daily.   Yes [provider]  feeding supplement, GLUCERNA SHAKE, (GLUCERNA SHAKE) LIQD Take 237 mLs by mouth 2 (two) times daily.   Yes [provider]  furosemide (LASIX) 20 MG tablet Take 20 mg by mouth daily.   Yes [provider]  glucose blood (GLUCOSE METER TEST) test strip See admin instructions.   Yes [provider]  Glucose Blood (PRODIGY VOICE BLOOD GLUCOSE VI)    Yes [provider]  insulin aspart (NOVOLOG) 100 UNIT/ML FlexPen Inject 2-12 Units into the skin See admin instructions. Injects 2-10 units of insulin under the skin 3 times a day per sliding scale: CBG 0-150: 2 units; CBG 151-200: 4 units; CBG 201-250: 6 units; CBG 251-300: 8 units; CBG 301-350: 10 units; CBG 351-400: 12 units; CBG >400: 12 units and notify the MD/NP 06/02/19  Yes [provider]  insulin glargine (LANTUS SOLOSTAR) 100 UNIT/ML Solostar Pen Inject 14 Units into the skin daily.   Yes [provider]  Insulin Pen Needle 32G X 4 MM MISC Used to inject insulin 3x daily 11/19/16  Yes Renato Shin, MD  levothyroxine (SYNTHROID) 75 MCG tablet Take  75 mcg by mouth daily before breakfast.   Yes [provider]  lidocaine (LIDODERM) 5 % Place 1 patch onto the skin daily. Remove & Discard patch within 12 hours or as directed by MD 01/25/15  Yes Hoyt Koch, MD  ondansetron (ZOFRAN-ODT) 4 MG disintegrating tablet Take 1  tablet (4 mg total) by mouth every 6 (six) hours as needed for nausea. 03/19/20  Yes Ileana Roup, MD  phenytoin (DILANTIN) 100 MG ER capsule Take 1 capsule (100 mg) in the morning and Take 2 capsules (200 mg) at bedtime Patient taking differently: Take 100-200 mg by mouth See admin instructions. Take 1 capsule (100 mg) in the morning (along with the 63m tablet for a total of 1527mevery morning), & take 2 capsules (200 mg) at bedtime. 05/16/19  Yes MiWard GivensNP  phenytoin (DILANTIN) 50 MG tablet Chew 50 mg by mouth daily. Take with 10059mapsule for a total of 150m91mery morning.   Yes [provider]  Polyethyl Glycol-Propyl Glycol (SYSTANE) 0.4-0.3 % SOLN Place 2 drops into both eyes as needed (dry eyes).   Yes [provider]  primidone (MYSOLINE) 50 MG tablet Three tablets in the morning and 2 at dinner time Patient taking differently: Take 100-150 mg by mouth See admin instructions. Take 150mg60mthe morning and 100mg 31minner time. 05/23/20  Yes WillisKathrynn DuckingThiamine HCl (VITAMIN B-1) 250 MG tablet Take 250 mg by mouth at bedtime.   Yes [provider]  traMADol (ULTRAM) 50 MG tablet Take 50 mg by mouth as needed (pain).   Yes [provider]  trandolapril-verapamil (TARKA) 2-240 MG tablet Take 1 tablet by mouth at bedtime.   Yes [provider]  oxyCODONE-acetaminophen (PERCOCET) 5-325 MG tablet Take 1-2 tablets by mouth every 6 (six) hours as needed. Patient not taking: No sig reported 04/21/20   PfeiffCharlesetta Shanks CT Head Wo Contrast  Result Date: 06/04/2020 CLINICAL DATA:  Unwitnessed fall. EXAM: CT HEAD WITHOUT CONTRAST TECHNIQUE: Contiguous axial images were obtained from the base of the skull through the vertex without intravenous contrast. COMPARISON:  April 21, 2020. FINDINGS: Brain: Stable right frontal encephalomalacia consistent with postoperative change. No mass effect or midline shift is noted. Ventricular  size is within normal limits. There is no evidence of mass lesion, hemorrhage or acute infarction. Vascular: No hyperdense vessel or unexpected calcification. Skull: Status post right frontal craniotomy. No acute abnormality is noted. Sinuses/Orbits: No acute finding. Other: None. IMPRESSION: Stable right frontal encephalomalacia consistent with postoperative change. No acute intracranial abnormality seen. Electronically Signed   By: James Marijo Conception  On: 06/04/2020 12:18   DG Chest Port 1 View  Result Date: 06/04/2020 CLINICAL DATA:  Fall.  Fractured hip.  Preop. EXAM: PORTABLE CHEST 1 VIEW COMPARISON:  04/01/2020 FINDINGS: Mild right hemidiaphragm elevation. Numerous leads and wires project over the chest. Midline trachea. Normal heart size. No pleural effusion or pneumothorax. Clear lungs. IMPRESSION: No acute cardiopulmonary disease. Electronically Signed   By: Kyle  Abigail Miyamoto  On: 06/04/2020 12:59   DG Knee Right Port  Result Date: 06/04/2020 CLINICAL DATA:  Right femoral neck fracture EXAM: PORTABLE RIGHT KNEE - 1-2 VIEW COMPARISON:  None. FINDINGS: Alignment is anatomic. There is no acute fracture. No joint effusion. Mild degenerative changes are present. IMPRESSION: No acute fracture. Electronically Signed   By: PraneiMacy Mis  On: 06/04/2020 17:05   US THYK[place]ID  Result Date: 06/03/2020 CLINICAL DATA:  Prior ultrasound follow-up. EXAM: THYROID ULTRASOUND TECHNIQUE: Ultrasound examination of the thyroid gland and adjacent soft tissues was performed. COMPARISON:  04/24/2014 FINDINGS: Parenchymal Echotexture: Moderately heterogenous Isthmus: 0.2 cm, previously 0.2 cm Right lobe: 4.5 x 1.1 x 1.3 cm, previously 4.7 x 1.4 x 1.9 cm Left lobe: 4.4 x 1.6 x 2.3 cm, previously 4.4 x 1.9 x 2.6 cm _________________________________________________________ Estimated total number of nodules >/= 1 cm: 2 Number of spongiform nodules >/=  2 cm not described below (TR1): 0 Number of mixed cystic and solid  nodules >/= 1.5 cm not described below ([place]): 0 _________________________________________________________ Nodule # 1: Prior biopsy: No Location: Right; Mid Maximum size: 1.0 cm; Other 2 dimensions: 0.9 x 0.5 cm, previously, 1.7 x 1.7 x 0.8 cm Composition: solid/almost completely solid (2) Echogenicity: hypoechoic (2) Shape: not taller-than-wide (0) Margins: ill-defined (0) Echogenic foci: none (0) ACR TI-RADS total points: 4. ACR TI-RADS risk category:  TR4 (4-6 points). Significant change in size (>/= 20% in two dimensions and minimal increase of 2 mm): No Change in features: Yes Change in ACR TI-RADS risk category: Yes ACR TI-RADS recommendations: This nodule does NOT meet TI-RADS criteria for biopsy or dedicated follow-up given 6 years stability. _________________________________________________________ Nodule # 2: Prior biopsy: No Location: Left; Superior Maximum size: 0.8 cm; Other 2 dimensions: 0.6 x 0.4 cm, previously, 0.9 x 0.6 x 0.5 cm Composition: solid/almost completely solid (2) Echogenicity: hypoechoic (2) Shape: not taller-than-wide (0) Margins: smooth (0) Echogenic foci: none (0) ACR TI-RADS total points: 4. ACR TI-RADS risk category:  TR4 (4-6 points). Significant change in size (>/= 20% in two dimensions and minimal increase of 2 mm): No Change in features: No Change in ACR TI-RADS risk category: No ACR TI-RADS recommendations: Given size (<0.9 cm) and appearance, this nodule does NOT meet TI-RADS criteria for biopsy or dedicated follow-up. _________________________________________________________ Nodule # 3: Prior biopsy: No Location: Left; Mid Maximum size: 2.5 cm; Other 2 dimensions: 2.4 x 1.4 cm, previously, 2.4 x 2.3 x 1.3 cm Composition: mixed cystic and solid (1) Echogenicity: isoechoic (1) Shape: not taller-than-wide (0) Margins: smooth (0) Echogenic foci: none (0) ACR TI-RADS total points: 2. ACR TI-RADS risk category:  TR2 (2 points). Significant change in size (>/= 20% in two dimensions  and minimal increase of 2 mm): No Change in features: No Change in ACR TI-RADS risk category: No ACR TI-RADS recommendations: This nodule does NOT meet TI-RADS criteria for biopsy or dedicated follow-up. _________________________________________________________ IMPRESSION: 1. Similar-appearing multinodular goiter. 2. The dominant nodule in the left mid thyroid (labeled 3, 2.5 cm, TI-RADS category 2) remains benign in appearance. No dedicated ultrasound follow-up or tissue sampling is recommended. 3. Decreased size of previously visualized right mid thyroid nodule (labeled 1, TI-RADS category 4) from 1.7 cm to 1.0 cm, now declared benign. No dedicated ultrasound follow-up or tissue sampling is recommended. The above is in keeping with the ACR TI-RADS recommendations - J Am Coll Radiol 2017;14:587-595. Ruthann Cancer, MD [facility] Radiology Electronically Signed   By: Ruthann Cancer MD   On: 06/03/2020 12:33   DG Hip Unilat With Pelvis 2-3 Views Right  Result Date: 06/04/2020 CLINICAL DATA:  Right hip pain after fall today. EXAM: DG HIP (WITH OR WITHOUT PELVIS) 2-3V RIGHT COMPARISON:  None. FINDINGS: Moderately displaced proximal right femoral neck fracture is noted. IMPRESSION: Moderately displaced proximal right femoral neck fracture. Electronically Signed   By: Marijo Conception M.D.   On:  06/04/2020 12:31    Positive ROS: All other systems have been reviewed and were otherwise negative with the exception of those mentioned in the HPI and as above.  Physical Exam: General: Alert, no acute distress Cardiovascular: No pedal edema Respiratory: No cyanosis, no use of accessory musculature GI: No organomegaly, abdomen is soft and non-tender Skin: No lesions in the area of chief complaint Neurologic: Sensation intact distally Psychiatric: Patient is competent for consent with normal mood and affect Lymphatic: No axillary or cervical  lymphadenopathy  MUSCULOSKELETAL: Examination the right hip reveals no skin wounds or lesions.  She is shortened and externally rotated.  Pain with attempted logrolling of the hip.  Positive motor function dorsiflexion, plantarflexion, and great toe extension.  She reports intact sensation.  Foot is perfused.  Assessment: Displaced right femoral neck fracture. Deconditioning. Diabetes.  Plan: I discussed the findings with the patient.  She has a displaced right hip fracture that requires surgical intervention.  We discussed the risk, benefits, and alternatives to right hip hemiarthroplasty versus total hip arthroplasty.  Please see statement of risk.  Plan for surgery today.  All questions were solicited and answered.  The risks, benefits, and alternatives were discussed with the patient. There are risks associated with the surgery including, but not limited to, problems with anesthesia (death), infection, instability (giving out of the joint), dislocation, differences in leg length/angulation/rotation, fracture of bones, loosening or failure of implants, hematoma (blood accumulation) which [place] require surgical drainage, blood clots, pulmonary embolism, nerve injury (foot drop and lateral thigh numbness), and blood vessel injury. The patient understands these risks and elects to proceed.   Bertram Savin, MD [phone]    06/05/2020 11:04 AM

## 2020-06-05 NOTE — Anesthesia Procedure Notes (Signed)
Spinal  Patient location during procedure: OR Start time: 06/05/2020 11:51 AM End time: 06/05/2020 12:01 PM Staffing Performed: anesthesiologist  Anesthesiologist: Duane Boston, MD Preanesthetic Checklist Completed: patient identified, IV checked, risks and benefits discussed, surgical consent, monitors and equipment checked, pre-op evaluation and timeout performed Spinal Block Patient position: right lateral decubitus Prep: DuraPrep Patient monitoring: cardiac monitor, continuous pulse ox and blood pressure Approach: midline Location: L2-3 Injection technique: single-shot Needle Needle type: Whitacre  Needle gauge: 22 G Needle length: 9 cm Additional Notes Functioning IV was confirmed and monitors were applied. Sterile prep and drape, including hand hygiene and sterile gloves were used. The patient was positioned and the spine was prepped. The skin was anesthetized with lidocaine.  Free flow of clear CSF was obtained prior to injecting local anesthetic into the CSF.  The spinal needle aspirated freely following injection.  The needle was carefully withdrawn.  The patient tolerated the procedure well.

## 2020-06-05 NOTE — Discharge Instructions (Signed)
°Dr. Inika Bellanger °Joint Replacement Specialist °[facility] °[address] °[place], [place] [address] °[phone] ° ° °TOTAL HIP REPLACEMENT POSTOPERATIVE DIRECTIONS ° ° ° °Hip Rehabilitation, Guidelines Following Surgery  ° °WEIGHT BEARING °Weight bearing as tolerated with assist device (walker, cane, etc) as directed, use it as long as suggested by your surgeon or therapist, typically at least 4-6 weeks. ° °The results of a hip operation are greatly improved after range of motion and muscle strengthening exercises. Follow all safety measures which are given to protect your hip. If any of these exercises cause increased pain or swelling in your joint, decrease the amount until you are comfortable again. Then slowly increase the exercises. Call your caregiver if you have problems or questions.  ° °HOME CARE INSTRUCTIONS  °Most of the following instructions are designed to prevent the dislocation of your new hip.  °Remove items at home which could result in a fall. This includes throw rugs or furniture in walking pathways.  °Continue medications as instructed at time of discharge. °· You Sprinkle have some home medications which will be placed on hold until you complete the course of blood thinner medication. °· You Climer start showering once you are discharged home. Do not remove your dressing. °Do not put on socks or shoes without following the instructions of your caregivers.   °Sit on chairs with arms. Use the chair arms to help push yourself up when arising.  °Arrange for the use of a toilet seat elevator so you are not sitting low.  °· Walk with walker as instructed.  °You Saffo resume a sexual relationship in one month or when given the OK by your caregiver.  °Use walker as long as suggested by your caregivers.  °You Kump put full weight on your legs and walk as much as is comfortable. °Avoid periods of inactivity such as sitting longer than an hour when not asleep. This helps prevent  blood clots.  °You Poliquin return to work once you are cleared by your surgeon.  °Do not drive a car for 6 weeks or until released by your surgeon.  °Do not drive while taking narcotics.  °Wear elastic stockings for two weeks following surgery during the day but you Jarema remove then at night.  °Make sure you keep all of your appointments after your operation with all of your doctors and caregivers. You should call the office at the above phone number and make an appointment for approximately two weeks after the date of your surgery. °Please pick up a stool softener and laxative for home use as long as you are requiring pain medications. °· ICE to the affected hip every three hours for 30 minutes at a time and then as needed for pain and swelling. Continue to use ice on the hip for pain and swelling from surgery. You Lastinger notice swelling that will progress down to the foot and ankle.  This is normal after surgery.  Elevate the leg when you are not up walking on it.   °It is important for you to complete the blood thinner medication as prescribed by your doctor. °· Continue to use the breathing machine which will help keep your temperature down.  It is common for your temperature to cycle up and down following surgery, especially at night when you are not up moving around and exerting yourself.  The breathing machine keeps your lungs expanded and your temperature down. ° °RANGE OF MOTION AND STRENGTHENING EXERCISES  °These exercises are   designed to help you keep full movement of your hip joint. Follow your caregiver's or physical therapist's instructions. Perform all exercises about fifteen times, three times per day or as directed. Exercise both hips, even if you have had only one joint replacement. These exercises can be done on a training (exercise) mat, on the floor, on a table or on a bed. Use whatever works the best and is most comfortable for you. Use music or television while you are exercising so that the exercises  are a pleasant break in your day. This will make your life better with the exercises acting as a break in routine you can look forward to.  °Lying on your back, slowly slide your foot toward your buttocks, raising your knee up off the floor. Then slowly slide your foot back down until your leg is straight again.  °Lying on your back spread your legs as far apart as you can without causing discomfort.  °Lying on your side, raise your upper leg and foot straight up from the floor as far as is comfortable. Slowly lower the leg and repeat.  °Lying on your back, tighten up the muscle in the front of your thigh (quadriceps muscles). You can do this by keeping your leg straight and trying to raise your heel off the floor. This helps strengthen the largest muscle supporting your knee.  °Lying on your back, tighten up the muscles of your buttocks both with the legs straight and with the knee bent at a comfortable angle while keeping your heel on the floor.  ° °SKILLED REHAB INSTRUCTIONS: °If the patient is transferred to a skilled rehab facility following release from the hospital, a list of the current medications will be sent to the facility for the patient to continue.  When discharged from the skilled rehab facility, please have the facility set up the patient's Home Health Physical Therapy prior to being released. Also, the skilled facility will be responsible for providing the patient with their medications at time of release from the facility to include their pain medication and their blood thinner medication. If the patient is still at the rehab facility at time of the two week follow up appointment, the skilled rehab facility will also need to assist the patient in arranging follow up appointment in our office and any transportation needs. ° °MAKE SURE YOU:  °Understand these instructions.  °Will watch your condition.  °Will get help right away if you are not doing well or get worse. ° °Pick up stool softner and  laxative for home use following surgery while on pain medications. °Do not remove your dressing. °The dressing is waterproof--it is OK to take showers. °Continue to use ice for pain and swelling after surgery. °Do not use any lotions or creams on the incision until instructed by your surgeon. °Total Hip Protocol. ° ° °

## 2020-06-05 NOTE — Anesthesia Postprocedure Evaluation (Signed)
Anesthesia Post Note  Patient: [name]  Procedure(s) Performed: ARTHROPLASTY  ANTERIOR APPROACH. RIGHT HIP (Right Hip)     Patient location during evaluation: PACU Anesthesia Type: MAC and Spinal Level of consciousness: awake and alert Pain management: pain level controlled Vital Signs Assessment: post-procedure vital signs reviewed and stable Respiratory status: spontaneous breathing and respiratory function stable Cardiovascular status: blood pressure returned to baseline and stable Postop Assessment: spinal receding Anesthetic complications: no   No complications documented.  Last Vitals:  Vitals:   06/05/20 1400 06/05/20 1415  BP: 138/60 (!) 144/67  Pulse: 78 80  Resp: 14 14  Temp:    SpO2: 99% 95%    Last Pain:  Vitals:   06/05/20 1415  TempSrc:   PainSc: 0-No pain                 Zahara Rembert DANIEL

## 2020-06-05 NOTE — Transfer of Care (Signed)
Immediate Anesthesia Transfer of Care Note  Patient: Ravyn S Grenier  Procedure(s) Performed: ARTHROPLASTY  ANTERIOR APPROACH. RIGHT HIP (Right Hip)  Patient Location: PACU  Anesthesia Type:Spinal  Level of Consciousness: awake and alert   Airway & Oxygen Therapy: Patient Spontanous Breathing and Patient connected to face mask oxygen  Post-op Assessment: Report given to RN and Post -op Vital signs reviewed and stable  Post vital signs: Reviewed and stable  Last Vitals:  Vitals Value Taken Time  BP 132/63 06/05/20 1353  Temp    Pulse    Resp 15 06/05/20 1355  SpO2    Vitals shown include unvalidated device data.  Last Pain:  Vitals:   06/05/20 1003  TempSrc: Oral  PainSc:       Patients Stated Pain Goal: 0 (40/08/67 6195)  Complications: No complications documented.

## 2020-06-05 NOTE — Anesthesia Preprocedure Evaluation (Addendum)
Anesthesia Evaluation  Patient identified by MRN, date of birth, ID band Patient awake    Reviewed: Allergy & Precautions, NPO status , Patient's Chart, lab work & pertinent test results  History of Anesthesia Complications Negative for: history of anesthetic complications  Airway Mallampati: II  TM Distance: >3 FB Neck ROM: Full    Dental no notable dental hx. (+) Dental Advisory Given   Pulmonary asthma ,    Pulmonary exam normal        Cardiovascular hypertension, Normal cardiovascular exam  IMPRESSIONS    1. Left ventricular ejection fraction, by estimation, is 60 to 65%. The  left ventricle has normal function. The left ventricle has no regional  wall motion abnormalities. Left ventricular diastolic parameters were  normal.  2. Right ventricular systolic function is normal. The right ventricular  size is normal. There is mildly elevated pulmonary artery systolic  pressure.  3. Left atrial size was moderately dilated.  4. The mitral valve is normal in structure. Trivial mitral valve  regurgitation. No evidence of mitral stenosis.  5. The aortic valve is tricuspid. Aortic valve regurgitation is not  visualized. Mild aortic valve sclerosis is present, with no evidence of  aortic valve stenosis.  6. The inferior vena cava is normal in size with greater than 50%  respiratory variability, suggesting right atrial pressure of 3 mmHg.    Neuro/Psych Seizures -, Well Controlled,  Anxiety H/o meningioma    GI/Hepatic Neg liver ROS, GERD  ,Colon cancer   Endo/Other  diabetes, Type 2, Insulin DependentHypothyroidism   Renal/GU negative Renal ROS  negative genitourinary   Musculoskeletal  (+) Arthritis ,   Abdominal   Peds  Hematology  (+) anemia ,   Anesthesia Other Findings  H/o breast cancer  Echo 12/14/19: EF 60-65%, normal RV function, mildly elevated PASP, mild aortic sclerosis w/o stenosis   Reproductive/Obstetrics                           Anesthesia Physical  Anesthesia Plan  ASA: III  Anesthesia Plan: Spinal and MAC   Post-op Pain Management:    Induction: Intravenous  PONV Risk Score and Plan: 3 and Ondansetron, Dexamethasone, Treatment Alpert vary due to age or medical condition and Propofol infusion  Airway Management Planned: Natural Airway  Additional Equipment:   Intra-op Plan:   Post-operative Plan: Extubation in OR  Informed Consent: I have reviewed the patients History and Physical, chart, labs and discussed the procedure including the risks, benefits and alternatives for the proposed anesthesia with the patient or authorized representative who has indicated his/her understanding and acceptance.     Dental advisory given  Plan Discussed with: Anesthesiologist and CRNA  Anesthesia Plan Comments:        Anesthesia Quick Evaluation

## 2020-06-06 ENCOUNTER — Encounter (HOSPITAL_COMMUNITY): Payer: Self-pay | Admitting: Orthopedic Surgery

## 2020-06-06 DIAGNOSIS — S72001P Fracture of unspecified part of neck of right femur, subsequent encounter for closed fracture with malunion: Secondary | ICD-10-CM | POA: Diagnosis not present

## 2020-06-06 DIAGNOSIS — I10 Essential (primary) hypertension: Secondary | ICD-10-CM | POA: Diagnosis not present

## 2020-06-06 DIAGNOSIS — E871 Hypo-osmolality and hyponatremia: Secondary | ICD-10-CM

## 2020-06-06 DIAGNOSIS — F419 Anxiety disorder, unspecified: Secondary | ICD-10-CM | POA: Diagnosis not present

## 2020-06-06 DIAGNOSIS — K219 Gastro-esophageal reflux disease without esophagitis: Secondary | ICD-10-CM | POA: Diagnosis not present

## 2020-06-06 DIAGNOSIS — D62 Acute posthemorrhagic anemia: Secondary | ICD-10-CM

## 2020-06-06 LAB — GLUCOSE, CAPILLARY
Glucose-Capillary: 215 mg/dL — ABNORMAL HIGH (ref 70–99)
Glucose-Capillary: 214 mg/dL — ABNORMAL HIGH (ref 70–99)
Glucose-Capillary: 178 mg/dL — ABNORMAL HIGH (ref 70–99)
Glucose-Capillary: 193 mg/dL — ABNORMAL HIGH (ref 70–99)

## 2020-06-06 LAB — BASIC METABOLIC PANEL
Anion gap: 9 (ref 5–15)
BUN: 11 mg/dL (ref 8–23)
CO2: 20 mmol/L — ABNORMAL LOW (ref 22–32)
Calcium: 7.5 mg/dL — ABNORMAL LOW (ref 8.9–10.3)
Chloride: 100 mmol/L (ref 98–111)
Creatinine, Ser: 0.71 mg/dL (ref 0.44–1.00)
GFR, Estimated: >60 mL/min (ref >60)
Glucose, Bld: 216 mg/dL — ABNORMAL HIGH (ref 70–99)
Potassium: 4.1 mmol/L (ref 3.5–5.1)
Sodium: 129 mmol/L — ABNORMAL LOW (ref 135–145)

## 2020-06-06 LAB — CBC WITH DIFFERENTIAL/PLATELET
Abs Immature Granulocytes: 0.05 10*3/uL (ref 0.00–0.07)
Basophils Absolute: 0 10*3/uL (ref 0.0–0.1)
Basophils Relative: 0 %
Eosinophils Absolute: 0.2 10*3/uL (ref 0.0–0.5)
Eosinophils Relative: 2 %
HCT: 24.7 % — ABNORMAL LOW (ref 36.0–46.0)
Hemoglobin: 7.9 g/dL — ABNORMAL LOW (ref 12.0–15.0)
Immature Granulocytes: 1 %
Lymphocytes Relative: 13 %
Lymphs Abs: 1 10*3/uL (ref 0.7–4.0)
MCH: 30.3 pg (ref 26.0–34.0)
MCHC: 32 g/dL (ref 30.0–36.0)
MCV: 94.6 fL (ref 80.0–100.0)
Monocytes Absolute: 0.5 10*3/uL (ref 0.1–1.0)
Monocytes Relative: 7 %
Neutro Abs: 6 10*3/uL (ref 1.7–7.7)
Neutrophils Relative %: 77 %
Platelets: 172 10*3/uL (ref 150–400)
RBC: 2.61 MIL/uL — ABNORMAL LOW (ref 3.87–5.11)
RDW: 16 % — ABNORMAL HIGH (ref 11.5–15.5)
WBC: 7.8 10*3/uL (ref 4.0–10.5)
nRBC: 0 % (ref 0.0–0.2)

## 2020-06-06 LAB — SODIUM, URINE, RANDOM: Sodium, Ur: 20 mmol/L

## 2020-06-06 LAB — HEMOGLOBIN AND HEMATOCRIT, BLOOD
HCT: 22.3 % — ABNORMAL LOW (ref 36.0–46.0)
Hemoglobin: 7.3 g/dL — ABNORMAL LOW (ref 12.0–15.0)

## 2020-06-06 LAB — PREPARE RBC (CROSSMATCH)

## 2020-06-06 LAB — CREATININE, URINE, RANDOM: Creatinine, Urine: 188.67 mg/dL

## 2020-06-06 MED ORDER — ACETAMINOPHEN 500 MG PO TABS
500.0000 mg | ORAL_TABLET | Freq: Three times a day (TID) | ORAL | Status: DC
  Administered 2020-06-07 – 2020-06-10 (×8): 500 mg via ORAL
  Filled 2020-06-06 (×9): qty 1

## 2020-06-06 MED ORDER — INSULIN GLARGINE 100 UNIT/ML ~~LOC~~ SOLN
7.0000 [IU] | Freq: Every day | SUBCUTANEOUS | Status: DC
  Administered 2020-06-06: 7 [IU] via SUBCUTANEOUS
  Filled 2020-06-06 (×2): qty 0.07

## 2020-06-06 MED ORDER — SODIUM CHLORIDE 0.9 % IV SOLN: INTRAVENOUS | Status: AC

## 2020-06-06 MED ORDER — ACETAMINOPHEN 325 MG PO TABS: 650.0000 mg | ORAL_TABLET | Freq: Once | ORAL | Status: DC

## 2020-06-06 MED ORDER — SENNOSIDES-DOCUSATE SODIUM 8.6-50 MG PO TABS
1.0000 | ORAL_TABLET | Freq: Two times a day (BID) | ORAL | Status: DC
  Administered 2020-06-06 – 2020-06-10 (×5): 1 via ORAL
  Filled 2020-06-06 (×7): qty 1

## 2020-06-06 MED ORDER — SODIUM CHLORIDE 0.9 % IV BOLUS
500.0000 mL | Freq: Once | INTRAVENOUS | Status: AC
  Administered 2020-06-06: 500 mL via INTRAVENOUS

## 2020-06-06 MED ORDER — ASPIRIN 81 MG PO CHEW: 81.0000 mg | CHEWABLE_TABLET | Freq: Two times a day (BID) | ORAL | 0 refills | Status: DC

## 2020-06-06 MED ORDER — OXYCODONE HCL 5 MG PO TABS: 5.0000 mg | ORAL_TABLET | Freq: Four times a day (QID) | ORAL | 0 refills | Status: DC | PRN

## 2020-06-06 MED ORDER — SODIUM CHLORIDE 0.9% IV SOLUTION: Freq: Once | INTRAVENOUS | Status: AC

## 2020-06-06 MED ORDER — CHOLESTYRAMINE 4 G PO PACK
4.0000 g | PACK | ORAL | Status: DC | PRN
  Filled 2020-06-06: qty 1

## 2020-06-06 MED ORDER — DIPHENHYDRAMINE HCL 25 MG PO CAPS
25.0000 mg | ORAL_CAPSULE | Freq: Once | ORAL | Status: AC
  Administered 2020-06-06: 25 mg via ORAL
  Filled 2020-06-06: qty 1

## 2020-06-06 MED ORDER — PANTOPRAZOLE SODIUM 40 MG PO TBEC
40.0000 mg | DELAYED_RELEASE_TABLET | Freq: Every day | ORAL | Status: DC
  Administered 2020-06-06 – 2020-06-10 (×5): 40 mg via ORAL
  Filled 2020-06-06 (×5): qty 1

## 2020-06-06 MED ORDER — DICLOFENAC SODIUM 1 % EX GEL
2.0000 g | CUTANEOUS | Status: DC | PRN
  Filled 2020-06-06: qty 100

## 2020-06-06 MED ORDER — VITAMIN D 25 MCG (1000 UNIT) PO TABS
5000.0000 [IU] | ORAL_TABLET | Freq: Every day | ORAL | Status: DC
  Administered 2020-06-06 – 2020-06-10 (×5): 5000 [IU] via ORAL
  Filled 2020-06-06 (×5): qty 5

## 2020-06-06 MED ORDER — DIPHENHYDRAMINE HCL 25 MG PO CAPS
25.0000 mg | ORAL_CAPSULE | Freq: Once | ORAL | Status: DC
  Filled 2020-06-06: qty 1

## 2020-06-06 NOTE — Plan of Care (Signed)
  Problem: Coping: Goal: Level of anxiety will decrease Outcome: Progressing   Problem: Pain Managment: Goal: General experience of comfort will improve Outcome: Progressing   

## 2020-06-06 NOTE — Progress Notes (Signed)
PT Cancellation Note  Patient Details Name: [name] MRN: [id number] DOB: [birth year]   Cancelled Treatment:    Reason Eval/Treat Not Completed: Pain limiting ability to participate Pt yelling out in pain with hygiene care.  RN also reports low BP.  Will check back as schedule permits.   Jaymason Ledesma,KATHrine E 06/06/2020, 11:08 AM Arlyce Dice, DPT Acute Rehabilitation Services Pager: [phone] Office: [phone]

## 2020-06-06 NOTE — Progress Notes (Signed)
    Subjective:  Patient reports pain as moderate.  Denies N/V/CP/SOB.   Objective:   VITALS:   Vitals:   06/05/20 1737 06/05/20 1832 06/05/20 2204 06/06/20 0527  BP: (!) 96/58 (!) 116/58 115/68 (!) 92/53  Pulse: 92 86 84 81  Resp: 18 18 16 16   Temp: (!) 97.4 F (36.3 C) 98.2 F (36.8 C) 98 F (36.7 C) 98.3 F (36.8 C)  TempSrc: Oral Oral Oral Oral  SpO2: 100% 99% 90% 100%  Weight:      Height:        NAD ABD soft Neurovascular intact Sensation intact distally Intact pulses distally Dorsiflexion/Plantar flexion intact Incision: dressing C/D/I   Lab Results  Component Value Date   WBC 7.8 06/06/2020   HGB 7.9 (L) 06/06/2020   HCT 24.7 (L) 06/06/2020   MCV 94.6 06/06/2020   PLT 172 06/06/2020   BMET    Component Value Date/Time   NA 129 (L) 06/06/2020 0309   NA 138 08/31/2017 1053   NA 138 01/04/2013 1212   K 4.1 06/06/2020 0309   K 3.9 01/04/2013 1212   CL 100 06/06/2020 0309   CO2 20 (L) 06/06/2020 0309   CO2 22 01/04/2013 1212   GLUCOSE 216 (H) 06/06/2020 0309   GLUCOSE 285 (H) 01/04/2013 1212   BUN 11 06/06/2020 0309   BUN 11 08/31/2017 1053   BUN 10.3 01/04/2013 1212   CREATININE 0.71 06/06/2020 0309   CREATININE 0.78 12/01/2013 1659   CREATININE 1.0 01/04/2013 1212   CALCIUM 7.5 (L) 06/06/2020 0309   CALCIUM 9.6 01/04/2013 1212   GFRNONAA >60 06/06/2020 0309   GFRAA >60 01/02/2020 0455     Assessment/Plan: 1 Day Post-Op   Principal Problem:   Closed right hip fracture (HCC) Active Problems:   Insulin-requiring or dependent type II diabetes mellitus (HCC)   Essential hypertension   GERD   Anxiety   Seizure disorder (HCC)   Tremor, essential   Hyponatremia   Leukocytosis   Postoperative anemia due to acute blood loss   WBAT with walker ABLA: HgB 7.9 this AM, continue to monitor daily and treat per hospitalist recommendations DVT ppx: Lovenox, SCDs, TEDS    Plan for ASA 81mg  BID at D/C PO pain control PT/OT Dispo: D/C  planning. Waiting on recommendations from PT/OT. Patient adamantly states that she wants to go home but only has help at home for 8 hours daily.    Dorothyann Peng 06/06/2020, 8:46 AM [facility] is now [employer] [address], [place], [place], [place] [address] Phone: [phone] [email or web address] Facebook  [employer]

## 2020-06-06 NOTE — Progress Notes (Signed)
PROGRESS NOTE    [name]  [id number] DOB: 1929/12/02 DOA: 06/04/2020 PCP: Bartholome Bill, MD    Chief Complaint  Patient presents with  . Fall    Brief Narrative:  Patient is a pleasant 87 year old female history of hypertension, hyperlipidemia, benign meningioma status post resection, OA, B12 deficiency, vitamin D deficiency, history of right breast cancer status post lumpectomy and radiation, seizure, anxiety, insulin-dependent diabetes presented to the hospital after mechanical fall sustaining a right hip fracture with complaints of severe right hip pain and inability to bear weight.  Head CT done unremarkable.  Plain films of the right hip and pelvis with a displaced right femoral neck fracture.  Orthopedics consulted and plan is for surgical repair today 06/05/2020.   Assessment & Plan:   Principal Problem:   Closed right hip fracture (HCC) Active Problems:   Insulin-requiring or dependent type II diabetes mellitus ([place])   Essential hypertension   GERD   Anxiety   Seizure disorder (HCC)   Tremor, essential   Hyponatremia   Leukocytosis   Postoperative anemia due to acute blood loss  1 closed right hip fracture Secondary to mechanical fall. Patient seen in consultation by orthopedics.  Patient subsequently underwent right total hip arthroplasty per Dr. Lyla Glassing 06/05/2020.  Patient with significant exquisite left hip pain.  Orthopedics to assess patient today.  Pain management per orthopedics.   PT/OT when okay with orthopedics. -Vitamin D 25 hydroxy 47.59. -Continue current pain management. -Place on scheduled Tylenol 500 mg 3 times daily. -Per orthopedics.  2.  Postop acute blood loss anemia Patient with borderline blood pressure.  Hemoglobin currently at 7.9 from 11.9 on presentation.  Partly dilutional.  Repeat H&H this afternoon and if hemoglobin is drifting further down will transfuse a unit of packed red blood cells.  PPI.  Follow.  3.  Well-controlled  insulin-dependent diabetes mellitus type 2 Hemoglobin A1c 6.4.  CBG at 215.  Resume Lantus at 7 units daily.  Sliding scale insulin.  Follow.   4.  Hypothyroidism Continue home regimen Synthroid.  5.  Hypertension Blood pressure borderline.  Discontinue antihypertensive medications for now.  Follow.   6.  Gastroesophageal reflux disease PPI.  7.  Depression/anxiety Continue Lexapro.  As needed anxiolytics per home regimen.  8.  Seizure disorder Stable.  Continue home regimen Dilantin.    9.  Essential tremor Primidone.    10.  Leukocytosis Likely reactive leukocytosis secondary to problem #1.  Patient afebrile.  No urinary symptoms.  Chest x-ray unremarkable.  Urinalysis unremarkable.  Leukocytosis trending down.  No need for antibiotics at this time.     DVT prophylaxis: SCDs.  Postop per orthopedics. Code Status: Full Family Communication:  No family at bedside. Disposition:   Status is: Inpatient    Dispo: The patient is from: Home              Anticipated d/c is to: Likely SNF versus home with home health              Anticipated d/c date is: 2 to 3 days.              Patient status post right hip arthroplasty, awaiting evaluation by physical therapy, patient with significant pain in the right hip, patient also with an anemia.  Not stable for discharge.   Difficult to place patient on the term       Consultants:   Orthopedics: Dr. Lyla Glassing 06/05/2020  Procedures  Plain films of the right  hip and pelvis 06/04/2020  Plain films of the right knee 06/04/2020  Chest x-ray 06/04/2020  CT head 06/04/2020  Right total hip arthroplasty per Dr. Lyla Glassing 06/05/2020  Antimicrobials:   None   Subjective: C/o significant right hip pain.  Denies any shortness of breath.  No chest pain.  No abdominal pain.  Stated received pain medications about 30 to 60 minutes ago.  Objective: Vitals:   06/05/20 2204 06/06/20 0154 06/06/20 0527 06/06/20 1015  BP: 115/68 (!) 100/42 (!)  92/53 (!) 94/52  Pulse: 84 86 81 81  Resp: 16 16 16 18   Temp: 98 F (36.7 C) 98.5 F (36.9 C) 98.3 F (36.8 C) 97.6 F (36.4 C)  TempSrc: Oral Oral Oral Oral  SpO2: 90% 98% 100% 99%  Weight:      Height:        Intake/Output Summary (Last 24 hours) at 06/06/2020 1042 Last data filed at 06/06/2020 0900 Gross per 24 hour  Intake 3467 ml  Output 1100 ml  Net 2367 ml   Filed Weights   06/05/20 0043  Weight: 62.5 kg    Examination:  General exam: NAD Respiratory system: CTAB anterior lung fields.  No wheezes, no crackles, no rhonchi.   Cardiovascular system: RRR no murmurs rubs or gallops.  No JVD.  No lower extremity edema.  Gastrointestinal system: Abdomen is soft, nontender, nondistended, positive bowel sounds.  No rebound.  No guarding.  Central nervous system: Alert and oriented. No focal neurological deficits. Extremities: Right hip exquistely TTP. Skin: No rashes, lesions or ulcers Psychiatry: Judgement and insight appear normal. Mood & affect appropriate.     Data Reviewed: I have personally reviewed following labs and imaging studies  CBC: Recent Labs  Lab 06/04/20 1152 06/04/20 1911 06/06/20 0355  WBC 16.1* 12.4* 7.8  NEUTROABS 14.4*  --  6.0  HGB 11.9* 11.5* 7.9*  HCT 36.1 35.0* 24.7*  MCV 92.1 92.1 94.6  PLT 249 246 [id number]    Basic Metabolic Panel: Recent Labs  Lab 06/04/20 1152 06/04/20 1911 06/06/20 0309  NA 138  --  129*  K 4.6  --  4.1  CL 105  --  100  CO2 21*  --  20*  GLUCOSE 135*  --  216*  BUN 14  --  11  CREATININE 0.83 0.66 0.71  CALCIUM 8.9  --  7.5*    GFR: Estimated Creatinine Clearance: 45.5 mL/min (by C-G formula based on SCr of 0.71 mg/dL).  Liver Function Tests: No results for input(s): AST, ALT, ALKPHOS, BILITOT, PROT, ALBUMIN in the last 168 hours.  CBG: Recent Labs  Lab 06/04/20 2044 06/05/20 0808 06/05/20 1623 06/05/20 2215 06/06/20 0733  GLUCAP 97 96 129* 118* 215*     Recent Results (from the past 240  hour(s))  SARS CORONAVIRUS 2 (TAT 6-24 HRS) Nasopharyngeal Nasopharyngeal Swab     Status: None   Collection Time: 06/04/20 12:04 PM   Specimen: Nasopharyngeal Swab  Result Value Ref Range Status   SARS Coronavirus 2 NEGATIVE NEGATIVE Final    Comment: (NOTE) SARS-CoV-2 target nucleic acids are NOT DETECTED.  The SARS-CoV-2 RNA is generally detectable in upper and lower respiratory specimens during the acute phase of infection. Negative results do not preclude SARS-CoV-2 infection, do not rule out co-infections with other pathogens, and should not be used as the sole basis for treatment or other patient management decisions. Negative results must be combined with clinical observations, patient history, and epidemiological information. The expected result is Negative.  Fact Sheet for Patients: SugarRoll.be  Fact Sheet for Healthcare Providers: https://www.woods-mathews.com/  This test is not yet approved or cleared by the [place] FDA and  has been authorized for detection and/or diagnosis of SARS-CoV-2 by FDA under an Emergency Use Authorization (EUA). This EUA will remain  in effect (meaning this test can be used) for the duration of the COVID-19 declaration under Se ction 564(b)(1) of the Act, 21 U.S.C. section 360bbb-3(b)(1), unless the authorization is terminated or revoked sooner.  Performed at [facility], [place] [address] [place], [place] [address]   Surgical pcr screen     Status: None   Collection Time: 06/05/20  3:25 AM   Specimen: Nasal Mucosa; Nasal Swab  Result Value Ref Range Status   MRSA, PCR NEGATIVE NEGATIVE Final   Staphylococcus aureus NEGATIVE NEGATIVE Final    Comment: (NOTE) The Xpert SA Assay (FDA approved for NASAL specimens in patients 51 years of age and older), is one component of a comprehensive surveillance program. It is not intended to diagnose infection nor to guide or monitor  treatment. Performed at [facility], [place] [address] [place], [place] [address]          Radiology Studies: CT Head Wo Contrast  Result Date: 06/04/2020 CLINICAL DATA:  Unwitnessed fall. EXAM: CT HEAD WITHOUT CONTRAST TECHNIQUE: Contiguous axial images were obtained from the base of the skull through the vertex without intravenous contrast. COMPARISON:  April 21, 2020. FINDINGS: Brain: Stable right frontal encephalomalacia consistent with postoperative change. No mass effect or midline shift is noted. Ventricular size is within normal limits. There is no evidence of mass lesion, hemorrhage or acute infarction. Vascular: No hyperdense vessel or unexpected calcification. Skull: Status post right frontal craniotomy. No acute abnormality is noted. Sinuses/Orbits: No acute finding. Other: None. IMPRESSION: Stable right frontal encephalomalacia consistent with postoperative change. No acute intracranial abnormality seen. Electronically Signed   By: Marijo Conception M.D.   On: 06/04/2020 12:18   Pelvis Portable  Result Date: 06/05/2020 CLINICAL DATA:  Right hip pain. EXAM: PORTABLE PELVIS 1-2 VIEWS COMPARISON:  None. FINDINGS: Generalized osteopenia. Right total hip arthroplasty with postsurgical changes in the surrounding soft tissues. No fracture or dislocation. IMPRESSION: Right total hip arthroplasty. Electronically Signed   By: Kathreen Devoid   On: 06/05/2020 14:51   DG Chest Port 1 View  Result Date: 06/04/2020 CLINICAL DATA:  Fall.  Fractured hip.  Preop. EXAM: PORTABLE CHEST 1 VIEW COMPARISON:  04/01/2020 FINDINGS: Mild right hemidiaphragm elevation. Numerous leads and wires project over the chest. Midline trachea. Normal heart size. No pleural effusion or pneumothorax. Clear lungs. IMPRESSION: No acute cardiopulmonary disease. Electronically Signed   By: Abigail Miyamoto M.D.   On: 06/04/2020 12:59   DG Knee Right Port  Result Date: 06/04/2020 CLINICAL DATA:  Right femoral neck  fracture EXAM: PORTABLE RIGHT KNEE - 1-2 VIEW COMPARISON:  None. FINDINGS: Alignment is anatomic. There is no acute fracture. No joint effusion. Mild degenerative changes are present. IMPRESSION: No acute fracture. Electronically Signed   By: Macy Mis M.D.   On: 06/04/2020 17:05   DG C-Arm 1-60 Min-No Report  Result Date: 06/05/2020 Fluoroscopy was utilized by the requesting physician.  No radiographic interpretation.   DG HIP OPERATIVE UNILAT W OR W/O PELVIS RIGHT  Result Date: 06/05/2020 CLINICAL DATA:  Right anterior hip. EXAM: OPERATIVE RIGHT HIP (WITH PELVIS IF PERFORMED) 9 VIEWS TECHNIQUE: Fluoroscopic spot image(s) were submitted for interpretation post-operatively. COMPARISON:  June 04, 2020. FINDINGS: Fluoro time: 13 seconds. Nine C-arm fluoroscopic images were obtained intraoperatively and submitted for post operative interpretation. These images demonstrate surgical change relating to total right hip arthroplasty. No unexpected findings. Please see the performing provider's procedural report for further detail. IMPRESSION: Intraoperative fluoroscopic imaging, as detailed above. Electronically Signed   By: Margaretha Sheffield MD   On: 06/05/2020 14:13   DG Hip Unilat With Pelvis 2-3 Views Right  Result Date: 06/04/2020 CLINICAL DATA:  Right hip pain after fall today. EXAM: DG HIP (WITH OR WITHOUT PELVIS) 2-3V RIGHT COMPARISON:  None. FINDINGS: Moderately displaced proximal right femoral neck fracture is noted. IMPRESSION: Moderately displaced proximal right femoral neck fracture. Electronically Signed   By: Marijo Conception M.D.   On: 06/04/2020 12:31        Scheduled Meds: . Chlorhexidine Gluconate Cloth  6 each Topical Daily  . cholecalciferol  5,000 Units Oral Daily  . cholestyramine light  2 g Oral BID  . enoxaparin (LOVENOX) injection  40 mg Subcutaneous Q24H  . escitalopram  10 mg Oral Daily  . feeding supplement (GLUCERNA SHAKE)  237 mL Oral BID  . insulin aspart  0-5  Units Subcutaneous QHS  . insulin aspart  0-9 Units Subcutaneous TID WC  . insulin glargine  7 Units Subcutaneous Daily  . levothyroxine  75 mcg Oral Q0600  . phenytoin  100 mg Oral q morning - 10a  . phenytoin  200 mg Oral QHS  . polyvinyl alcohol  2 drop Both Eyes Daily  . primidone  100 mg Oral QHS  . primidone  150 mg Oral q morning - 10a  . senna-docusate  1 tablet Oral BID  . vitamin B-1  250 mg Oral QHS   Continuous Infusions: . sodium chloride 100 mL/hr at 06/06/20 1009     LOS: 2 days    Time spent: 35 minutes    Irine Seal, MD [facility]   To contact the attending provider between 7A-7P or the covering provider during after hours 7P-7A, please log into the web site [email or web address] and access using universal [place] password for that web site. If you do not have the password, please call the hospital operator.  06/06/2020, 10:42 AM

## 2020-06-06 NOTE — TOC Initial Note (Signed)
Transition of Care [facility]) - Initial/Assessment Note    Patient Details  Name: [name] MRN: [id number] Date of Birth: 03/19/1930  [facility] CM/SW Contact:    Lennart Pall, LCSW Phone Number: 06/06/2020, 2:34 PM  Clinical Narrative:                  Met pt and, with her permission, have spoken with niece Emelda Fear @ [phone]) to discuss pt's current home supports and potential dc plans/ needs.  Both confirm that pt was living alone but does have 8hrs/ day of assistance (daytime) x 7 days/ week.  This is provided by a combination of family members and private duty caregiver.  Pt has also had a couple of SNF stays with the most recent in November at [facility].  Pt prefers to dc home and states, "but I guess I would need someone there all the time."  We discussed plan to await report and recommendations form PT and possible need to reconsider another SNF rehab stay.  Both agreeable.  Niece feels this [place] be the best plan in order for her to get more therapy - will follow up for decision.   Expected Discharge Plan: [place] (vs SNF) Barriers to Discharge: Continued Medical Work up   Patient Goals and CMS Choice Patient states their goals for this hospitalization and ongoing recovery are:: to go home      Expected Discharge Plan and Services Expected Discharge Plan: [place] (vs SNF) In-house Referral: Clinical Social Work     Living arrangements for the past 2 months: [place] (Home a "few weeks" from SNF)                                      Prior Living Arrangements/Services Living arrangements for the past 2 months: Single Family Home (Home a "few weeks" from SNF) Lives with:: Self Patient language and need for interpreter reviewed:: Yes Do you feel safe going back to the place where you live?: Yes      Need for Family Participation in Patient Care: Yes (Comment) Care giver support system  in place?: Yes (comment) (however, not 24/7) Current home services: DME,Home PT Criminal Activity/Legal Involvement Pertinent to Current Situation/Hospitalization: No - Comment as needed  Activities of Daily Living Home Assistive Devices/Equipment: Shower chair without back,Grab bars in shower,Bedside commode/3-in-1,Walker (specify type),Other (Comment),CBG Meter (walk-in shower, standard toilet, front wheeled walker) ADL Screening (condition at time of admission) Patient's cognitive ability adequate to safely complete daily activities?: Yes Is the patient deaf or have difficulty hearing?: No Does the patient have difficulty seeing, even when wearing glasses/contacts?: No Does the patient have difficulty concentrating, remembering, or making decisions?: No Patient able to express need for assistance with ADLs?: Yes Does the patient have difficulty dressing or bathing?: Yes Independently performs ADLs?: No Communication: Independent Dressing (OT): Needs assistance Is this a change from baseline?: Pre-admission baseline Grooming: Needs assistance Is this a change from baseline?: Pre-admission baseline Feeding: Needs assistance Is this a change from baseline?: Pre-admission baseline Bathing: Needs assistance Is this a change from baseline?: Pre-admission baseline Toileting: Dependent Is this a change from baseline?: Change from baseline, expected to last >3days In/Out Bed: Dependent Is this a change from baseline?: Change from baseline, expected to last >3 days Walks in Home: Dependent Is this a change from baseline?: Change  from baseline, expected to last >3 days Does the patient have difficulty walking or climbing stairs?: Yes (secondary to weakness) Weakness of Legs: Right (R/L, neuropathy in feet) Weakness of Arms/Hands: None  Permission Sought/Granted Permission sought to share information with : Family Supports Permission granted to share information with : Yes, Verbal  Permission Granted  Share Information with NAME: [name]     Permission granted to share info w Relationship: neice  Permission granted to share info w Contact Information: [phone]  Emotional Assessment Appearance:: Appears stated age Attitude/Demeanor/Rapport: Engaged Affect (typically observed): Accepting Orientation: : Oriented to Self,Oriented to Place,Oriented to  Time,Oriented to Situation Alcohol / Substance Use: Not Applicable Psych Involvement: No (comment)  Admission diagnosis:  Closed right hip fracture (HCC) [S72.001A] Closed fracture of neck of right femur, initial encounter (HCC) [S72.001A] Closed displaced fracture of right femoral neck (HCC) [S72.001A] Patient Active Problem List   Diagnosis Date Noted  . Postoperative anemia due to acute blood loss 06/06/2020  . Leukocytosis   . Closed right hip fracture (HCC) 06/04/2020  . Constipation 05/27/2020  . Dyslipidemia 05/27/2020  . Enterocolitis due to Clostridium difficile, not specified as recurrent 05/27/2020  . Pleural effusion on right 04/01/2020  . Acute respiratory failure with hypoxia (HCC) 04/01/2020  . Pneumonia of right lung due to infectious organism 04/01/2020  . Uncontrolled type 2 diabetes mellitus with hyperglycemia, with long-term current use of insulin (HCC) 04/01/2020  . Abnormal CT of the chest 04/01/2020  . Subacute pulmonary embolism (HCC) 04/01/2020  . Acute metabolic encephalopathy 04/01/2020  . Adenocarcinoma of colon (HCC) 04/01/2020  . Nausea & vomiting 03/12/2020  . Cancer of ascending colon pT3pN0 (0/12 LN) s/p lap right colectomy 03/06/2020 03/09/2020  . History of multiple allergies 03/09/2020  . S/P right hemicolectomy 03/06/2020  . Closed fracture of shaft of tibia 02/12/2020  . Abnormal feces 12/11/2019  . Gait abnormality 12/04/2019  . Impaired left ventricular function 09/05/2019  . Hypokalemia   . Effusion of left knee joint   . Hyponatremia   . Left patella  fracture 10/26/2018  . Fracture of patella 10/26/2018  . Pain in left knee 09/29/2018  . Acquired hypothyroidism 08/25/2017  . 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 Hammer toe 08/25/2017  . History of breast cancer 08/25/2017  . Nontoxic thyroid nodule 08/25/2017  . Osteopetrosis 08/25/2017  . Dysuria 01/19/2017  . Vitamin D deficiency 11/18/2016  . Tremor, essential 08/18/2016  . Seizure disorder (HCC) 10/16/2014  . Brain tumor (benign) (HCC) 10/18/2013  . Subdural hemorrhage (HCC) 10/09/2013  . Anxiety 10/09/2013  . Compression fracture of T12 vertebra (HCC) 10/09/2013  . Nontoxic multinodular goiter 09/05/2013  . Syncope 08/27/2013  . Carotid artery disease (HCC) 08/27/2013  . Abnormal thyroid ultrasound 08/27/2013  . Cancer of central portion of female breast (HCC) 12/30/2012  . Osteoporosis 03/06/2010  . ASTHMA 03/05/2009  . Asthma 03/05/2009  . IRRITABLE BOWEL SYNDROME  diarrhea type 03/21/2008  . ANEMIA-NOS 12/16/2006  . GERD 12/16/2006  . Insulin-requiring or dependent type II diabetes mellitus (HCC) 10/29/2006  . Mixed hyperlipidemia 10/29/2006  . Essential hypertension 10/29/2006  . Allergic rhinitis, cause unspecified 10/29/2006  . OSTEOARTHRITIS 10/29/2006   PCP:  Verlon Au, MD Pharmacy:   [facility] [phone] [place], [place] - [phone] AT [facility] [address] [facility] [address] [place] [address] Phone: [phone] Fax: [phone]  [facility] - [place], [place] - [address]  [place] [address] Phone: [phone] Fax: [phone]  [id number] [place], [place]: [phone]: [phone]    Social Determinants of Health ([place] Interventions    Readmission Risk Interventions Readmission Risk Prevention Plan  06/06/2020 03/15/2020 03/13/2020  Transportation Screening Complete Complete -  PCP or Specialist Appt within 3-5 Days - Complete Complete  HRI or Home Care Consult - - Complete  Social Work Consult for [place]anning/Counseling - Complete Complete  Palliative Care Screening - Complete Complete  Medication Review (Press photographer - Complete Complete  PCP or Specialist appointment within 3-5 days of discharge Complete - -  SW Recovery Care/Counseling Consult Complete - -  Palliative Care Screening Not Applicable - -  Some recent data might be hidden

## 2020-06-06 NOTE — Progress Notes (Signed)
Patient recuperating from surgery-Visited to encourage and provide spiritual support and prayer. Patient in good spirits and had appetite-When I asked how her pain was she said it was being managed well and we celebrated that she doesn't have to be in pain.  Thanks to the staff for all you do- the wonderful care and encouragement .  Conard Novak, Chaplain   06/06/20 1800  Clinical Encounter Type  Visited With Patient (Patient)  Visit Type Spiritual support  Consult/Referral To Chaplain  Spiritual Encounters  Spiritual Needs Prayer;Emotional  Stress Factors  Patient Stress Factors Health changes

## 2020-06-06 NOTE — Evaluation (Signed)
Physical Therapy Evaluation Patient Details Name: [name] MRN: [id number] DOB: [birth year] Today's Date: 06/06/2020   History of Present Illness  [name] is an 87 y.o. female with a past medical history of chronic hyponatremia, IDDM2, hypothyroidism, hypertension, hyperlipidemia, left tib/fib fracture, breast cancer, brain tumor s/p surgery complicated with seizures, colon cancer, and admitted for closed right hip fracture.  Pt s/p R DA THA on 06/05/20.  Clinical Impression  Pt admitted with above diagnosis.  Pt currently with functional limitations due to the deficits listed below (see PT Problem List). Pt will benefit from skilled PT to increase their independence and safety with mobility to allow discharge to the venue listed below.  Pt provided with cues and increased time to self assist however still required total assist for bed mobility due to pain.  Pt declined attempting to stand today.  Pt prefers d/c home however would recommend SNF upon d/c at this time.      Follow Up Recommendations SNF    Equipment Recommendations  None recommended by PT    Recommendations for Other Services       Precautions / Restrictions Precautions Precautions: Fall Restrictions Weight Bearing Restrictions: No RLE Weight Bearing: Weight bearing as tolerated      Mobility  Bed Mobility Overal bed mobility: Needs Assistance Bed Mobility: Supine to Sit;Sit to Supine     Supine to sit: Total assist Sit to supine: Total assist   General bed mobility comments: provided gait belt for pt to self assist R LE and pt able to move minimally, reporting pain, pt provided with time however unable to perform so assisted with upper and lower body with bed mobility, utilized bed pad for positioning    Transfers                 General transfer comment: pt declined  Ambulation/Gait                Stairs            Wheelchair Mobility    Modified Rankin (Stroke Patients Only)        Balance Overall balance assessment: Needs assistance;History of Falls Sitting-balance support: No upper extremity supported;Feet supported Sitting balance-Leahy Scale: Fair Sitting balance - Comments: able to sit EOB and drink water for approx 2 minutes, no dizziness                                     Pertinent Vitals/Pain Pain Assessment: Faces Faces Pain Scale: Hurts even more Pain Location: right hip/thigh Pain Descriptors / Indicators: Grimacing;Discomfort;Sore Pain Intervention(s): Repositioned;Monitored during session;Ice applied    Home Living Family/patient expects to be discharged to:: Private residence Living Arrangements: Alone Available Help at Discharge: Personal care attendant (caregiver during the day) Type of Home: House Home Access: Stairs to enter Entrance Stairs-Rails: Psychiatric nurse of Steps: 4 Home Layout: One level Home Equipment: Walker - standard;Toilet riser;Grab bars - toilet;Grab bars - tub/shower;Shower seat;Bedside commode;Walker - 2 wheels      Prior Function Level of Independence: Needs assistance   Gait / Transfers Assistance Needed: recently returned home from SNF, using RW, fell using BSC and sustained right hip fx leading to admission  ADL's / Homemaking Assistance Needed: requires assist        Hand Dominance        Extremity/Trunk Assessment        Lower Extremity  Assessment Lower Extremity Assessment: Generalized weakness;LLE deficits/detail;RLE deficits/detail RLE Deficits / Details: no active movement observed, pt required assist RLE: Unable to fully assess due to pain LLE Deficits / Details: reports previous ankle injury, foot/ankle rests in PF and inversion and pt unable to obtain neutral actively    Cervical / Trunk Assessment Cervical / Trunk Assessment: Kyphotic;Other exceptions Cervical / Trunk Exceptions: forward neck posture  Communication   Communication: No difficulties   Cognition Arousal/Alertness: Awake/alert Behavior During Therapy: Flat affect Overall Cognitive Status: Within Functional Limits for tasks assessed                                        General Comments      Exercises     Assessment/Plan    PT Assessment Patient needs continued PT services  PT Problem List Decreased balance;Decreased knowledge of use of DME;Decreased mobility;Decreased activity tolerance;Decreased strength;Pain       PT Treatment Interventions Gait training;DME instruction;Therapeutic exercise;Balance training;Functional mobility training;Therapeutic activities;Patient/family education;Wheelchair mobility training    PT Goals (Current goals can be found in the Care Plan section)  Acute Rehab PT Goals Patient Stated Goal: to go home PT Goal Formulation: With patient Time For Goal Achievement: 06/20/20 Potential to Achieve Goals: Good    Frequency Min 3X/week   Barriers to discharge        Co-evaluation               AM-PAC PT "6 Clicks" Mobility  Outcome Measure Help needed turning from your back to your side while in a flat bed without using bedrails?: Total Help needed moving from lying on your back to sitting on the side of a flat bed without using bedrails?: Total Help needed moving to and from a bed to a chair (including a wheelchair)?: Total Help needed standing up from a chair using your arms (e.g., wheelchair or bedside chair)?: Total Help needed to walk in hospital room?: Total Help needed climbing 3-5 steps with a railing? : Total 6 Click Score: 6    End of Session Equipment Utilized During Treatment: Gait belt Activity Tolerance: Patient limited by pain Patient left: in bed;with call bell/phone within reach;with bed alarm set Nurse Communication: Mobility status PT Visit Diagnosis: Difficulty in walking, not elsewhere classified (R26.2);Unsteadiness on feet (R26.81)    Time: 4496-7591 PT Time Calculation (min)  (ACUTE ONLY): 25 min   Charges:   PT Evaluation $PT Eval Low Complexity: 1 Low PT Treatments $Therapeutic Activity: 8-22 mins      Jannette Spanner PT, DPT Acute Rehabilitation Services Pager: [phone] Office: [phone]  [name],KATHrine E 06/06/2020, 1:10 PM

## 2020-06-07 DIAGNOSIS — K219 Gastro-esophageal reflux disease without esophagitis: Secondary | ICD-10-CM | POA: Diagnosis not present

## 2020-06-07 DIAGNOSIS — I10 Essential (primary) hypertension: Secondary | ICD-10-CM | POA: Diagnosis not present

## 2020-06-07 DIAGNOSIS — S72001A Fracture of unspecified part of neck of right femur, initial encounter for closed fracture: Secondary | ICD-10-CM | POA: Diagnosis not present

## 2020-06-07 DIAGNOSIS — F419 Anxiety disorder, unspecified: Secondary | ICD-10-CM | POA: Diagnosis not present

## 2020-06-07 LAB — BPAM RBC
Blood Product Expiration Date: 202202222359
ISSUE DATE / TIME: 202202032140
Unit Type and Rh: 6200

## 2020-06-07 LAB — BASIC METABOLIC PANEL
Anion gap: 7 (ref 5–15)
BUN: 9 mg/dL (ref 8–23)
CO2: 19 mmol/L — ABNORMAL LOW (ref 22–32)
Calcium: 7.1 mg/dL — ABNORMAL LOW (ref 8.9–10.3)
Chloride: 103 mmol/L (ref 98–111)
Creatinine, Ser: 0.65 mg/dL (ref 0.44–1.00)
GFR, Estimated: >60 mL/min (ref >60)
Glucose, Bld: 144 mg/dL — ABNORMAL HIGH (ref 70–99)
Potassium: 4.3 mmol/L (ref 3.5–5.1)
Sodium: 129 mmol/L — ABNORMAL LOW (ref 135–145)

## 2020-06-07 LAB — CBC
HCT: 25.6 % — ABNORMAL LOW (ref 36.0–46.0)
Hemoglobin: 8.5 g/dL — ABNORMAL LOW (ref 12.0–15.0)
MCH: 30.5 pg (ref 26.0–34.0)
MCHC: 33.2 g/dL (ref 30.0–36.0)
MCV: 91.8 fL (ref 80.0–100.0)
Platelets: 135 10*3/uL — ABNORMAL LOW (ref 150–400)
RBC: 2.79 MIL/uL — ABNORMAL LOW (ref 3.87–5.11)
RDW: 15.6 % — ABNORMAL HIGH (ref 11.5–15.5)
WBC: 5.6 10*3/uL (ref 4.0–10.5)
nRBC: 0 % (ref 0.0–0.2)

## 2020-06-07 LAB — TYPE AND SCREEN
ABO/RH(D): A POS
Antibody Screen: NEGATIVE
Unit division: 0

## 2020-06-07 LAB — GLUCOSE, CAPILLARY
Glucose-Capillary: 145 mg/dL — ABNORMAL HIGH (ref 70–99)
Glucose-Capillary: 149 mg/dL — ABNORMAL HIGH (ref 70–99)
Glucose-Capillary: 199 mg/dL — ABNORMAL HIGH (ref 70–99)
Glucose-Capillary: 138 mg/dL — ABNORMAL HIGH (ref 70–99)
Glucose-Capillary: 170 mg/dL — ABNORMAL HIGH (ref 70–99)

## 2020-06-07 MED ORDER — OXYCODONE HCL 5 MG PO TABS
2.5000 mg | ORAL_TABLET | Freq: Four times a day (QID) | ORAL | Status: DC | PRN
  Administered 2020-06-07 – 2020-06-08 (×4): 2.5 mg via ORAL
  Filled 2020-06-07 (×4): qty 1

## 2020-06-07 MED ORDER — SODIUM CHLORIDE 0.9 % IV SOLN
510.0000 mg | Freq: Once | INTRAVENOUS | Status: AC
  Administered 2020-06-07: 510 mg via INTRAVENOUS
  Filled 2020-06-07: qty 510

## 2020-06-07 MED ORDER — INSULIN GLARGINE 100 UNIT/ML ~~LOC~~ SOLN
7.0000 [IU] | Freq: Every day | SUBCUTANEOUS | Status: DC
  Administered 2020-06-07 – 2020-06-10 (×4): 7 [IU] via SUBCUTANEOUS
  Filled 2020-06-07 (×4): qty 0.07

## 2020-06-07 NOTE — TOC Progression Note (Addendum)
Transition of Care [facility]) - Progression Note    Patient Details  Name: [name] MRN: [id number] Date of Birth: Jun 20, 1929  Transition of Care [facility]) CM/SW Contact  Lennart Pall, [place] Phone Number: 06/07/2020, 2:48 PM  Clinical Narrative:    Met with patient and niece this afternoon to discuss PT recommendations for SNF.  Niece explaining to pt that family and caregivers cannot provide 24/7 coverage and feels pt is best served to go to SNF for rehab.  Pt is reluctantly agreeable and states she would prefer [facility] as she has been there before.  Will reach out to that facility and others to begin bed search process.    ADDENDUM: Alerted today that pt has exhausted all of her Medicare covered SNF days with most recent SNF stay in Nov - Dec 2021.  Have explained to pt (who kept eyes closed but nods) and to her niece and sister.  Have stressed that they must discuss dc plan and make decision if they want to privately pay for either 24/7 care at home (pt already has 8 hrs/ day) or for SNF.  Will ask weekend TOC to follow up if able.   Expected Discharge Plan: [place] (vs SNF) Barriers to Discharge: Continued Medical Work up  Expected Discharge Plan and Services Expected Discharge Plan: [place] (vs SNF) In-house Referral: Clinical Social Work     Living arrangements for the past 2 months: [place] (Home a "few weeks" from SNF)                                       Social Determinants of Health (SDOH) Interventions    Readmission Risk Interventions Readmission Risk Prevention Plan 06/06/2020 03/15/2020 03/13/2020  Transportation Screening Complete Complete -  PCP or Specialist Appt within 3-5 Days - Complete Complete  HRI or [place] - - Complete  Social Work Consult for [place] Planning/Counseling - Complete Complete  Palliative Care Screening - Complete Complete  Medication Review Press photographer) -  Complete Complete  PCP or Specialist appointment within 3-5 days of discharge Complete - -  SW Recovery Care/Counseling Consult Complete - -  Palliative Care Screening Not Applicable - -  Some recent data might be hidden

## 2020-06-07 NOTE — NC FL2 (Signed)
[place] LEVEL OF CARE SCREENING TOOL     IDENTIFICATION  Patient Name: [name] Birthdate: Glander 09, 1931 Sex: female Admission Date (Current Location): 06/04/2020  [facility] and [place] Number:  Herbalist and Address:  [facility],  [place], [place]      Provider Number: [id number]  Attending Physician Name and Address:  Eugenie Filler, MD  Relative Name and Phone Number:  niece, Emelda Fear @ [phone]    Current Level of Care: Hospital Recommended Level of Care: [place] Prior Approval Number:    Date Approved/Denied:   PASRR Number: [id number]  Discharge Plan: SNF    Current Diagnoses: Patient Active Problem List   Diagnosis Date Noted  . Postoperative anemia due to acute blood loss 06/06/2020  . Leukocytosis   . Closed right hip fracture ([place]) 06/04/2020  . Constipation 05/27/2020  . Dyslipidemia 05/27/2020  . Enterocolitis due to Clostridium difficile, not specified as recurrent 05/27/2020  . Pleural effusion on right 04/01/2020  . Acute respiratory failure with hypoxia ([place]) 04/01/2020  . Pneumonia of right lung due to infectious organism 04/01/2020  . Uncontrolled type 2 diabetes mellitus with hyperglycemia, with long-term current use of insulin ([place]) 04/01/2020  . Abnormal CT of the chest 04/01/2020  . Subacute pulmonary embolism ([place]) 04/01/2020  . Acute metabolic encephalopathy 96/78/9381  . Adenocarcinoma of colon ([place]) 04/01/2020  . Nausea & vomiting 03/12/2020  . Cancer of ascending colon pT3pN0 (0/12 LN) s/p lap right colectomy 03/06/2020 03/09/2020  . History of multiple allergies 03/09/2020  . S/P right hemicolectomy 03/06/2020  . Closed fracture of shaft of tibia 02/12/2020  . Abnormal feces 12/11/2019  . Gait abnormality 12/04/2019  . Impaired left ventricular function 09/05/2019  . Hypokalemia   . Effusion of left knee joint   . Hyponatremia   . Left patella  fracture 10/26/2018  . Fracture of patella 10/26/2018  . Pain in left knee 09/29/2018  . Acquired hypothyroidism 08/25/2017  . 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 Hammer toe 08/25/2017  . History of breast cancer 08/25/2017  . Nontoxic thyroid nodule 08/25/2017  . Osteopetrosis 08/25/2017  . Dysuria 01/19/2017  . Vitamin D deficiency 11/18/2016  . Tremor, essential 08/18/2016  . Seizure disorder ([place]) 10/16/2014  . Brain tumor (benign) ([place]) 10/18/2013  . Subdural hemorrhage ([place]) 10/09/2013  . Anxiety 10/09/2013  . Compression fracture of T12 vertebra ([place]) 10/09/2013  . Nontoxic multinodular goiter 09/05/2013  . Syncope 08/27/2013  . Carotid artery disease ([place]) 08/27/2013  . Abnormal thyroid ultrasound 08/27/2013  . Cancer of central portion of female breast ([place]) 12/30/2012  . Osteoporosis 03/06/2010  . ASTHMA 03/05/2009  . Asthma 03/05/2009  . IRRITABLE BOWEL SYNDROME  diarrhea type 03/21/2008  . ANEMIA-NOS 12/16/2006  . GERD 12/16/2006  . Insulin-requiring or dependent type II diabetes mellitus ([place]) 10/29/2006  . Mixed hyperlipidemia 10/29/2006  . Essential hypertension 10/29/2006  . Allergic rhinitis, cause unspecified 10/29/2006  . OSTEOARTHRITIS 10/29/2006    Orientation RESPIRATION BLADDER Height & Weight     Self,Time,Situation,Place  Normal Continent Weight: 137 lb 12.6 oz (62.5 kg) Height:  5\' 6"  (167.6 cm)  BEHAVIORAL SYMPTOMS/MOOD NEUROLOGICAL BOWEL NUTRITION STATUS      Continent    AMBULATORY STATUS COMMUNICATION OF NEEDS Skin   Extensive Assist Verbally Surgical wounds                       Personal  Care Assistance Level of Assistance  Bathing,Dressing Bathing Assistance: Limited assistance   Dressing Assistance: Limited assistance     Functional Limitations Info             SPECIAL CARE FACTORS FREQUENCY  PT (By licensed PT),OT (By licensed OT)     PT Frequency: 5x/wk OT Frequency: 5x/wk             Contractures Contractures Info: Not present    Additional Factors Info  Code Status,Allergies Code Status Info: Full Allergies Info: see MAR           Current Medications (06/07/2020):  This is the current hospital active medication list Current Facility-Administered Medications  Medication Dose Route Frequency Provider Last Rate Last Admin  . acetaminophen (TYLENOL) tablet 500 mg  500 mg Oral TID Rodolph Bong, MD   500 mg at 06/07/20 0841  . acetaminophen (TYLENOL) tablet 650 mg  650 mg Oral Q6H PRN Swinteck, Arlys John, MD      . acetaminophen (TYLENOL) tablet 650 mg  650 mg Oral Once Rodolph Bong, MD      . ALPRAZolam Prudy Feeler) tablet 0.25 mg  0.25 mg Oral Q12H PRN Samson Frederic, MD   0.25 mg at 06/07/20 0350  . Chlorhexidine Gluconate Cloth 2 % PADS 6 each  6 each Topical Daily Rodolph Bong, MD   6 each at 06/07/20 [phone]  . cholecalciferol (VITAMIN D3) tablet 5,000 Units  5,000 Units Oral Daily Rodolph Bong, MD   5,000 Units at 06/07/20 0840  . cholestyramine (QUESTRAN) packet 4 g  4 g Oral PRN Rodolph Bong, MD      . cholestyramine light (PREVALITE) packet 2 g  2 g Oral BID Samson Frederic, MD   2 g at 06/07/20 [phone]  . diclofenac Sodium (VOLTAREN) 1 % topical gel 2 g  2 g Topical PRN Rodolph Bong, MD      . enoxaparin (LOVENOX) injection 40 mg  40 mg Subcutaneous Q24H Samson Frederic, MD   40 mg at 06/07/20 0840  . escitalopram (LEXAPRO) tablet 10 mg  10 mg Oral Daily Samson Frederic, MD   10 mg at 06/07/20 0841  . feeding supplement (GLUCERNA SHAKE) (GLUCERNA SHAKE) liquid 237 mL  237 mL Oral BID Samson Frederic, MD   237 mL at 06/07/20 0840  . HYDROmorphone (DILAUDID) injection 0.5 mg  0.5 mg Intravenous Q3H PRN Samson Frederic, MD   0.5 mg at 06/07/20 0156  . insulin aspart (novoLOG) injection 0-5 Units  0-5 Units Subcutaneous QHS Swinteck, Arlys John, MD      . insulin aspart (novoLOG) injection 0-9 Units  0-9 Units Subcutaneous TID WC Samson Frederic, MD   1 Units at 06/04/20 1734  . insulin glargine (LANTUS) injection 7 Units  7 Units Subcutaneous QHS Rodolph Bong, MD      . levothyroxine (SYNTHROID) tablet 75 mcg  75 mcg Oral Q0600 Samson Frederic, MD   75 mcg at 06/07/20 0615  . menthol-cetylpyridinium (CEPACOL) lozenge 3 mg  1 lozenge Oral PRN Swinteck, Arlys John, MD       Or  . phenol (CHLORASEPTIC) mouth spray 1 spray  1 spray Mouth/Throat PRN Swinteck, Arlys John, MD      . methocarbamol (ROBAXIN) tablet 500 mg  500 mg Oral Q8H PRN Samson Frederic, MD   500 mg at 06/07/20 9371  . metoCLOPramide (REGLAN) tablet 5-10 mg  5-10 mg Oral Q8H PRN Samson Frederic, MD       Or  .  metoCLOPramide (REGLAN) injection 5-10 mg  5-10 mg Intravenous Q8H PRN Swinteck, Aaron Edelman, MD      . ondansetron [facility]) tablet 4 mg  4 mg Oral Q6H PRN Swinteck, Aaron Edelman, MD       Or  . ondansetron (ZOFRAN) injection 4 mg  4 mg Intravenous Q6H PRN Swinteck, Aaron Edelman, MD      . oxyCODONE (Oxy IR/ROXICODONE) immediate release tablet 2.5 mg  2.5 mg Oral Q6H PRN Eugenie Filler, MD   2.5 mg at 06/07/20 1333  . pantoprazole (PROTONIX) EC tablet 40 mg  40 mg Oral Q0600 Eugenie Filler, MD   40 mg at 06/07/20 0615  . phenytoin (DILANTIN) ER capsule 100 mg  100 mg Oral q morning - 10a Rod Can, MD   100 mg at 06/07/20 0842  . phenytoin (DILANTIN) ER capsule 200 mg  200 mg Oral QHS Rod Can, MD   200 mg at 06/06/20 2026  . polyethylene glycol (MIRALAX / GLYCOLAX) packet 17 g  17 g Oral Daily PRN Swinteck, Aaron Edelman, MD      . polyvinyl alcohol (LIQUIFILM TEARS) 1.4 % ophthalmic solution 2 drop  2 drop Both Eyes Daily Rod Can, MD   2 drop at 06/07/20 0839  . primidone (MYSOLINE) tablet 100 mg  100 mg Oral QHS Rod Can, MD   100 mg at 06/06/20 2029  . primidone (MYSOLINE) tablet 150 mg  150 mg Oral q morning - 10a Rod Can, MD   150 mg at 06/07/20 0841  . senna-docusate (Senokot-S) tablet 1 tablet  1 tablet Oral BID Eugenie Filler, MD   1  tablet at 06/07/20 [phone]  . thiamine tablet 250 mg  250 mg Oral QHS Rod Can, MD   250 mg at 06/06/20 2027     Discharge Medications: Please see discharge summary for a list of discharge medications.  Relevant Imaging Results:  Relevant Lab Results:   Additional Information SSN: [place]  Lashawnda Hancox, LCSW

## 2020-06-07 NOTE — Progress Notes (Addendum)
PROGRESS NOTE    [name]  [id number] DOB: July 27, 1929 DOA: 06/04/2020 PCP: Bartholome Bill, MD    Chief Complaint  Patient presents with  . Fall    Brief Narrative:  Patient is a pleasant 87 year old female history of hypertension, hyperlipidemia, benign meningioma status post resection, OA, B12 deficiency, vitamin D deficiency, history of right breast cancer status post lumpectomy and radiation, seizure, anxiety, insulin-dependent diabetes presented to the hospital after mechanical fall sustaining a right hip fracture with complaints of severe right hip pain and inability to bear weight.  Head CT done unremarkable.  Plain films of the right hip and pelvis with a displaced right femoral neck fracture.  Orthopedics consulted and plan is for surgical repair today 06/05/2020.   Assessment & Plan:   Principal Problem:   Closed right hip fracture (HCC) Active Problems:   Insulin-requiring or dependent type II diabetes mellitus ([place])   Essential hypertension   GERD   Anxiety   Seizure disorder (HCC)   Tremor, essential   Hyponatremia   Leukocytosis   Postoperative anemia due to acute blood loss  1 closed right hip fracture Secondary to mechanical fall. Patient seen in consultation by orthopedics.  Patient subsequently underwent right total hip arthroplasty per Dr. Lyla Glassing 06/05/2020.  Patient with improvement with right hip pain.  We will decrease oxycodone to 2.5 mg every 4 hours as needed due to drowsiness earlier this morning..  Pain management per orthopedics.   PT/OT when okay with orthopedics. -Vitamin D 25 hydroxy 47.59. -Continue Tylenol 500 mg 3 times daily.   -Per orthopedics.  2.  Postop acute blood loss anemia Patient with borderline blood pressure postop with hemoglobin dropping as low as 7.3 from 11.9 on presentation.  Patient status post transfusion 1 unit packed red blood cells hemoglobin currently at 8.5.  We will give a dose of IV Feraheme.  Continue PPI.   Repeat labs in the morning.  3.  Well-controlled insulin-dependent diabetes mellitus type 2 Hemoglobin A1c 6.4.  CBG 145 this morning.  Continue Lantus 7 units nightly.  Sliding scale insulin.  Follow.  4.  Hypothyroidism Synthroid.    5.  Hypertension Blood pressure borderline.  Antihypertensive medications on hold.  BP improved with transfusion and IV fluids.  Follow.    6.  Gastroesophageal reflux disease Protonix.   7.  Depression/anxiety Lexapro.  Anxiolytics as needed.   8.  Seizure disorder Stable.  Continue Dilantin.   9.  Essential tremor Continue primidone.    10.  Leukocytosis Likely reactive leukocytosis secondary to problem #1.  Patient afebrile.  No urinary symptoms.  Chest x-ray unremarkable.  Urinalysis unremarkable.  Leukocytosis resolved.  No need for antibiotics.  11.  Hyponatremia Likely secondary to hypovolemic hyponatremia as patient noted to have borderline/soft blood pressure postoperatively and noted to be anemic.  Status post transfusion 1 unit packed red blood cells with improvement with hyponatremia.  Continue gentle hydration.  Follow.   DVT prophylaxis: Lovenox. Postop per orthopedics. Code Status: Full Family Communication:  No family at bedside. Disposition:   Status is: Inpatient    Dispo: The patient is from: Home              Anticipated d/c is to: Likely SNF versus home with home health              Anticipated d/c date is: 2 to 3 days.              Patient status post right  hip arthroplasty, assessed by PT awaiting SNF placement.  Not stable for discharge.    Difficult to place patient on the term       Consultants:   Orthopedics: Dr. Lyla Glassing 06/05/2020  Procedures  Plain films of the right hip and pelvis 06/04/2020  Plain films of the right knee 06/04/2020  Chest x-ray 06/04/2020  CT head 06/04/2020  Right total hip arthroplasty per Dr. Lyla Glassing 06/05/2020  Transfusion 1 unit packed red blood cells 06/06/2020  Antimicrobials:    None   Subjective: Sleeping but arousable. Patient noted to have received Xanax as well as Robaxin and oxycodone early this morning around 6:14 AM and 6:26 AM. Patient denies any chest pain. No shortness of breath. States right hip pain currently controlled. Complaining of pain all over. Niece is at bedside states patient with complaints of back and neck pain.   Objective: Vitals:   06/06/20 2217 06/07/20 0230 06/07/20 0533 06/07/20 0832  BP: (!) 117/58 121/66 126/64   Pulse: 74 74 71   Resp: (!) 22 16 16    Temp: 98.1 F (36.7 C) 97.8 F (36.6 C) 98 F (36.7 C)   TempSrc: Oral Oral Oral   SpO2: 97% 99% 97% 97%  Weight:      Height:        Intake/Output Summary (Last 24 hours) at 06/07/2020 1126 Last data filed at 06/07/2020 0600 Gross per 24 hour  Intake 2174 ml  Output 1625 ml  Net 549 ml   Filed Weights   06/05/20 0043  Weight: 62.5 kg    Examination:  General exam: NAD. Respiratory system: Clear to auscultation bilaterally anterior lung fields.  No wheezes, no crackles, no rhonchi.  Normal respiratory effort.   Cardiovascular system: Regular rate rhythm no murmurs rubs or gallops.  No JVD.  No lower extremity edema.  Gastrointestinal system: Abdomen is soft, nontender, nondistended, positive bowel sounds.  No rebound.  No guarding.  Central nervous system: Alert and oriented. No focal neurological deficits. Extremities: Right hip less exquisitely tender to palpation.  Skin: No rashes, lesions or ulcers Psychiatry: Judgement and insight appear normal. Mood & affect appropriate.     Data Reviewed: I have personally reviewed following labs and imaging studies  CBC: Recent Labs  Lab 06/04/20 1152 06/04/20 1911 06/06/20 0355 06/06/20 1517 06/07/20 0308  WBC 16.1* 12.4* 7.8  --  5.6  NEUTROABS 14.4*  --  6.0  --   --   HGB 11.9* 11.5* 7.9* 7.3* 8.5*  HCT 36.1 35.0* 24.7* 22.3* 25.6*  MCV 92.1 92.1 94.6  --  91.8  PLT 249 246 172  --  135*    Basic  Metabolic Panel: Recent Labs  Lab 06/04/20 1152 06/04/20 1911 06/06/20 0309 06/07/20 0308  NA 138  --  129* 129*  K 4.6  --  4.1 4.3  CL 105  --  100 103  CO2 21*  --  20* 19*  GLUCOSE 135*  --  216* 144*  BUN 14  --  11 9  CREATININE 0.83 0.66 0.71 0.65  CALCIUM 8.9  --  7.5* 7.1*    GFR: Estimated Creatinine Clearance: 45.5 mL/min (by C-G formula based on SCr of 0.65 mg/dL).  Liver Function Tests: No results for input(s): AST, ALT, ALKPHOS, BILITOT, PROT, ALBUMIN in the last 168 hours.  CBG: Recent Labs  Lab 06/06/20 0733 06/06/20 1147 06/06/20 1554 06/06/20 2119 06/07/20 0741  GLUCAP 215* 214* 178* 193* 145*     Recent Results (from  the past 240 hour(s))  SARS CORONAVIRUS 2 (TAT 6-24 HRS) Nasopharyngeal Nasopharyngeal Swab     Status: None   Collection Time: 06/04/20 12:04 PM   Specimen: Nasopharyngeal Swab  Result Value Ref Range Status   SARS Coronavirus 2 NEGATIVE NEGATIVE Final    Comment: (NOTE) SARS-CoV-2 target nucleic acids are NOT DETECTED.  The SARS-CoV-2 RNA is generally detectable in upper and lower respiratory specimens during the acute phase of infection. Negative results do not preclude SARS-CoV-2 infection, do not rule out co-infections with other pathogens, and should not be used as the sole basis for treatment or other patient management decisions. Negative results must be combined with clinical observations, patient history, and epidemiological information. The expected result is Negative.  Fact Sheet for Patients: SugarRoll.be  Fact Sheet for Healthcare Providers: https://www.woods-mathews.com/  This test is not yet approved or cleared by the [place] FDA and  has been authorized for detection and/or diagnosis of SARS-CoV-2 by FDA under an Emergency Use Authorization (EUA). This EUA will remain  in effect (meaning this test can be used) for the duration of the COVID-19 declaration under  Se ction 564(b)(1) of the Act, 21 U.S.C. section 360bbb-3(b)(1), unless the authorization is terminated or revoked sooner.  Performed at [facility], [place] [address] [place], [place] [address]   Surgical pcr screen     Status: None   Collection Time: 06/05/20  3:25 AM   Specimen: Nasal Mucosa; Nasal Swab  Result Value Ref Range Status   MRSA, PCR NEGATIVE NEGATIVE Final   Staphylococcus aureus NEGATIVE NEGATIVE Final    Comment: (NOTE) The Xpert SA Assay (FDA approved for NASAL specimens in patients 85 years of age and older), is one component of a comprehensive surveillance program. It is not intended to diagnose infection nor to guide or monitor treatment. Performed at [facility], [place] [address], [place], [place] [address]          Radiology Studies: Pelvis Portable  Result Date: 06/05/2020 CLINICAL DATA:  Right hip pain. EXAM: PORTABLE PELVIS 1-2 VIEWS COMPARISON:  None. FINDINGS: Generalized osteopenia. Right total hip arthroplasty with postsurgical changes in the surrounding soft tissues. No fracture or dislocation. IMPRESSION: Right total hip arthroplasty. Electronically Signed   By: Kathreen Devoid   On: 06/05/2020 14:51   DG C-Arm 1-60 Min-No Report  Result Date: 06/05/2020 Fluoroscopy was utilized by the requesting physician.  No radiographic interpretation.   DG HIP OPERATIVE UNILAT W OR W/O PELVIS RIGHT  Result Date: 06/05/2020 CLINICAL DATA:  Right anterior hip. EXAM: OPERATIVE RIGHT HIP (WITH PELVIS IF PERFORMED) 9 VIEWS TECHNIQUE: Fluoroscopic spot image(s) were submitted for interpretation post-operatively. COMPARISON:  June 04, 2020. FINDINGS: Fluoro time: 13 seconds. Nine C-arm fluoroscopic images were obtained intraoperatively and submitted for post operative interpretation. These images demonstrate surgical change relating to total right hip arthroplasty. No unexpected findings. Please see the performing provider's procedural report  for further detail. IMPRESSION: Intraoperative fluoroscopic imaging, as detailed above. Electronically Signed   By: Margaretha Sheffield MD   On: 06/05/2020 14:13        Scheduled Meds: . acetaminophen  500 mg Oral TID  . acetaminophen  650 mg Oral Once  . Chlorhexidine Gluconate Cloth  6 each Topical Daily  . cholecalciferol  5,000 Units Oral Daily  . cholestyramine light  2 g Oral BID  . enoxaparin (LOVENOX) injection  40 mg Subcutaneous Q24H  . escitalopram  10 mg Oral Daily  . feeding supplement (  GLUCERNA SHAKE)  237 mL Oral BID  . insulin aspart  0-5 Units Subcutaneous QHS  . insulin aspart  0-9 Units Subcutaneous TID WC  . insulin glargine  7 Units Subcutaneous Daily  . levothyroxine  75 mcg Oral Q0600  . pantoprazole  40 mg Oral Q0600  . phenytoin  100 mg Oral q morning - 10a  . phenytoin  200 mg Oral QHS  . polyvinyl alcohol  2 drop Both Eyes Daily  . primidone  100 mg Oral QHS  . primidone  150 mg Oral q morning - 10a  . senna-docusate  1 tablet Oral BID  . vitamin B-1  250 mg Oral QHS   Continuous Infusions: . ferumoxytol       LOS: 3 days    Time spent: 35 minutes    Irine Seal, MD [facility]   To contact the attending provider between 7A-7P or the covering provider during after hours 7P-7A, please log into the web site [email or web address] and access using universal [place] password for that web site. If you do not have the password, please call the hospital operator.  06/07/2020, 11:26 AM

## 2020-06-07 NOTE — Progress Notes (Signed)
Physical Therapy Treatment Patient Details Name: [name] MRN: [id number] DOB: 06-24-1929 Today's Date: 06/07/2020    History of Present Illness [name] is an 87 y.o. female with a past medical history of chronic hyponatremia, IDDM2, hypothyroidism, hypertension, hyperlipidemia, left tib/fib fracture, breast cancer, brain tumor s/p surgery complicated with seizures, colon cancer, and admitted for closed right hip fracture.  Pt s/p R DA THA on 06/05/20.    PT Comments    Pt agreeable to attempt mobility today however required a little encouragement.  Pt reports increased pain with movement (premedicated).  Pt required total assist for bed mobility and mod +2 assist to transfer to recliner.  Continue to recommend SNF upon d/c.   Follow Up Recommendations  SNF     Equipment Recommendations  None recommended by PT    Recommendations for Other Services       Precautions / Restrictions Precautions Precautions: Fall Restrictions Weight Bearing Restrictions: No RLE Weight Bearing: Weight bearing as tolerated    Mobility  Bed Mobility Overal bed mobility: Needs Assistance Bed Mobility: Supine to Sit     Supine to sit: Total assist     General bed mobility comments: pt able to move minimally by self assist R LE however unable to completely move LE to EOB, reporting pain, pt provided with time however unable to perform so assisted with upper and lower body with bed mobility, utilized bed pad for positioning  Transfers Overall transfer level: Needs assistance Equipment used: Rolling walker (2 wheeled) Transfers: Sit to/from [facility]   Stand pivot transfers: Mod assist;+2 physical assistance;From elevated surface       General transfer comment: assist for rise and steady, cues for weight shifting and using UEs through RW for support; pt able to stand approx 1.5 minutes and then took seated rest break; pt assisted again to standing and cues for shifting feet  over to transfer to recliner; assist for weakness and stability as well as manuevering RW; pt unable to lift feet from floor  Ambulation/Gait                 Stairs             Wheelchair Mobility    Modified Rankin (Stroke Patients Only)       Balance Overall balance assessment: Needs assistance;History of Falls         Standing balance support: Bilateral upper extremity supported Standing balance-Leahy Scale: Zero                              Cognition Arousal/Alertness: Awake/alert Behavior During Therapy: WFL for tasks assessed/performed Overall Cognitive Status: Within Functional Limits for tasks assessed                                        Exercises      General Comments        Pertinent Vitals/Pain Pain Assessment: Faces Faces Pain Scale: Hurts even more Pain Location: right hip/thigh Pain Descriptors / Indicators: Grimacing;Discomfort;Sore Pain Intervention(s): Repositioned;Premedicated before session;Monitored during session  Pt remained on 3L O2 [place] during session, SPO2 upper 90s pre and post session.    Home Living                      Prior Function  PT Goals (current goals can now be found in the care plan section) Progress towards PT goals: Progressing toward goals    Frequency    Min 3X/week      PT Plan Current plan remains appropriate    Co-evaluation              AM-PAC PT "6 Clicks" Mobility   Outcome Measure  Help needed turning from your back to your side while in a flat bed without using bedrails?: Total Help needed moving from lying on your back to sitting on the side of a flat bed without using bedrails?: Total Help needed moving to and from a bed to a chair (including a wheelchair)?: A Lot Help needed standing up from a chair using your arms (e.g., wheelchair or bedside chair)?: A Lot Help needed to walk in hospital room?: Total Help needed climbing  3-5 steps with a railing? : Total 6 Click Score: 8    End of Session Equipment Utilized During Treatment: Gait belt Activity Tolerance: Patient limited by pain Patient left: in chair;with chair alarm set;with call bell/phone within reach Nurse Communication: Mobility status;Need for lift equipment (recommended stedy for return to bed) PT Visit Diagnosis: Difficulty in walking, not elsewhere classified (R26.2);Unsteadiness on feet (R26.81)     Time: 3220-2542 PT Time Calculation (min) (ACUTE ONLY): 33 min  Charges:  $Therapeutic Activity: 23-37 mins                     Jannette Spanner PT, DPT Acute Rehabilitation Services Pager: [phone] Office: [phone]   York Ram E 06/07/2020, 3:17 PM

## 2020-06-07 NOTE — Progress Notes (Signed)
Pt sat up in chair for approx 1 hour then requested to go back to bed. Pt complained of a lot of pain, mediated with dilaudid as ordered prn. Oxygen weaned to 1L at this time, sats 93%. Pt denies any sob.

## 2020-06-07 NOTE — Plan of Care (Signed)
  Problem: Health Behavior/Discharge Planning: Goal: Ability to manage health-related needs will improve Outcome: Progressing   Problem: Pain Managment: Goal: General experience of comfort will improve Outcome: Progressing   Problem: Health Behavior/Discharge Planning: Goal: Ability to manage health-related needs will improve Outcome: Progressing   Problem: Pain Managment: Goal: General experience of comfort will improve Outcome: Progressing

## 2020-06-08 DIAGNOSIS — S72001P Fracture of unspecified part of neck of right femur, subsequent encounter for closed fracture with malunion: Secondary | ICD-10-CM | POA: Diagnosis not present

## 2020-06-08 DIAGNOSIS — F419 Anxiety disorder, unspecified: Secondary | ICD-10-CM | POA: Diagnosis not present

## 2020-06-08 DIAGNOSIS — K219 Gastro-esophageal reflux disease without esophagitis: Secondary | ICD-10-CM | POA: Diagnosis not present

## 2020-06-08 DIAGNOSIS — I10 Essential (primary) hypertension: Secondary | ICD-10-CM | POA: Diagnosis not present

## 2020-06-08 LAB — BASIC METABOLIC PANEL
Anion gap: 5 (ref 5–15)
Anion gap: 6 (ref 5–15)
BUN: 9 mg/dL (ref 8–23)
BUN: 8 mg/dL (ref 8–23)
CO2: 18 mmol/L — ABNORMAL LOW (ref 22–32)
CO2: 21 mmol/L — ABNORMAL LOW (ref 22–32)
Calcium: 7.3 mg/dL — ABNORMAL LOW (ref 8.9–10.3)
Calcium: 7.8 mg/dL — ABNORMAL LOW (ref 8.9–10.3)
Chloride: 103 mmol/L (ref 98–111)
Chloride: 105 mmol/L (ref 98–111)
Creatinine, Ser: 0.57 mg/dL (ref 0.44–1.00)
Creatinine, Ser: 0.55 mg/dL (ref 0.44–1.00)
GFR, Estimated: >60 mL/min (ref >60)
GFR, Estimated: >60 mL/min (ref >60)
Glucose, Bld: 173 mg/dL — ABNORMAL HIGH (ref 70–99)
Glucose, Bld: 143 mg/dL — ABNORMAL HIGH (ref 70–99)
Potassium: 4.4 mmol/L (ref 3.5–5.1)
Potassium: 4.5 mmol/L (ref 3.5–5.1)
Sodium: 126 mmol/L — ABNORMAL LOW (ref 135–145)
Sodium: 132 mmol/L — ABNORMAL LOW (ref 135–145)

## 2020-06-08 LAB — CBC
HCT: 26.6 % — ABNORMAL LOW (ref 36.0–46.0)
Hemoglobin: 8.8 g/dL — ABNORMAL LOW (ref 12.0–15.0)
MCH: 30.8 pg (ref 26.0–34.0)
MCHC: 33.1 g/dL (ref 30.0–36.0)
MCV: 93 fL (ref 80.0–100.0)
Platelets: 170 10*3/uL (ref 150–400)
RBC: 2.86 MIL/uL — ABNORMAL LOW (ref 3.87–5.11)
RDW: 15.6 % — ABNORMAL HIGH (ref 11.5–15.5)
WBC: 5.9 10*3/uL (ref 4.0–10.5)
nRBC: 0 % (ref 0.0–0.2)

## 2020-06-08 LAB — TSH: TSH: 3.899 u[IU]/mL (ref 0.350–4.500)

## 2020-06-08 LAB — SODIUM, URINE, RANDOM: Sodium, Ur: 74 mmol/L

## 2020-06-08 LAB — OSMOLALITY: Osmolality: 274 mOsm/kg — ABNORMAL LOW (ref 275–295)

## 2020-06-08 LAB — OSMOLALITY, URINE: Osmolality, Ur: 229 mOsm/kg — ABNORMAL LOW (ref 300–900)

## 2020-06-08 LAB — CREATININE, URINE, RANDOM: Creatinine, Urine: 15.92 mg/dL

## 2020-06-08 LAB — CORTISOL: Cortisol, Plasma: 12 ug/dL

## 2020-06-08 LAB — GLUCOSE, CAPILLARY
Glucose-Capillary: 149 mg/dL — ABNORMAL HIGH (ref 70–99)
Glucose-Capillary: 229 mg/dL — ABNORMAL HIGH (ref 70–99)
Glucose-Capillary: 236 mg/dL — ABNORMAL HIGH (ref 70–99)
Glucose-Capillary: 322 mg/dL — ABNORMAL HIGH (ref 70–99)

## 2020-06-08 MED ORDER — OXYCODONE HCL 5 MG PO TABS
2.5000 mg | ORAL_TABLET | Freq: Once | ORAL | Status: AC
Start: 2020-06-08 — End: 2020-06-08
  Administered 2020-06-08: 2.5 mg via ORAL
  Filled 2020-06-08: qty 1

## 2020-06-08 MED ORDER — FUROSEMIDE 10 MG/ML IJ SOLN
20.0000 mg | Freq: Once | INTRAMUSCULAR | Status: AC
  Administered 2020-06-08: 20 mg via INTRAVENOUS
  Filled 2020-06-08: qty 2

## 2020-06-08 MED ORDER — OXYCODONE HCL 5 MG PO TABS
2.5000 mg | ORAL_TABLET | ORAL | Status: DC | PRN
  Administered 2020-06-08 – 2020-06-10 (×9): 2.5 mg via ORAL
  Filled 2020-06-08 (×9): qty 1

## 2020-06-08 NOTE — Plan of Care (Signed)
  Problem: Education: Goal: Knowledge of General Education information will improve Description: Including pain rating scale, medication(s)/side effects and non-pharmacologic comfort measures Outcome: Progressing   Problem: Clinical Measurements: Goal: Ability to maintain clinical measurements within normal limits will improve Outcome: Progressing   

## 2020-06-08 NOTE — Progress Notes (Signed)
PROGRESS NOTE    [name]  [id number] DOB: 1929-07-13 DOA: 06/04/2020 PCP: Bartholome Bill, MD    Chief Complaint  Patient presents with  . Fall    Brief Narrative:  Patient is a pleasant 87 year old female history of hypertension, hyperlipidemia, benign meningioma status post resection, OA, B12 deficiency, vitamin D deficiency, history of right breast cancer status post lumpectomy and radiation, seizure, anxiety, insulin-dependent diabetes presented to the hospital after mechanical fall sustaining a right hip fracture with complaints of severe right hip pain and inability to bear weight.  Head CT done unremarkable.  Plain films of the right hip and pelvis with a displaced right femoral neck fracture.  Orthopedics consulted and plan is for surgical repair today 06/05/2020.   Assessment & Plan:   Principal Problem:   Closed right hip fracture (HCC) Active Problems:   Insulin-requiring or dependent type II diabetes mellitus ([place])   Essential hypertension   GERD   Anxiety   Seizure disorder (HCC)   Tremor, essential   Hyponatremia   Leukocytosis   Postoperative anemia due to acute blood loss  1 closed right hip fracture Secondary to mechanical fall. Patient seen in consultation by orthopedics.  Patient subsequently underwent right total hip arthroplasty per Dr. Lyla Glassing 06/05/2020.  Patient with improvement with right hip pain.  Patient complaining today that right hip pain not fully managed and states pain medication not lasting long enough.  Change oxycodone to 2.5 mg every 3 hours as needed.  Continue scheduled Tylenol.  Vitamin D 25 hydroxy at 47.59.  PT/OT.   Pain management per orthopedics.   Per orthopedics.   2.  Postop acute blood loss anemia Patient with borderline blood pressure postop with hemoglobin dropping as low as 7.3 from 11.9 on presentation.  Patient status post transfusion 1 unit packed red blood cells hemoglobin currently at 8.8.  Status post IV  Feraheme.  PPI.  Transfusion threshold hemoglobin < 7.   3.  Well-controlled insulin-dependent diabetes mellitus type 2 Hemoglobin A1c 6.4.  CBG 149.  Continue Lantus 7 units daily.  Sliding scale insulin.  Patient asking for diet to be liberalized.    4.  Hypothyroidism Check TSH. Continue Synthroid.    5.  Hypertension Blood pressure borderline.  Antihypertensive medications on hold.  BP improved with transfusion and IV fluids.  Patient received a dose of IV Lasix today.  Could likely resume antihypertensive medications in the next 1 to 2 days.  Follow.    6.  Gastroesophageal reflux disease Continue PPI.   7.  Depression/anxiety Discontinue Lexapro secondary to hyponatremia.  Anxiolytics as needed.   8.  Seizure disorder Dilantin.    9.  Essential tremor Primidone.   10.  Leukocytosis Likely reactive leukocytosis secondary to problem #1.  Patient afebrile.  No urinary symptoms.  Chest x-ray unremarkable.  Urinalysis unremarkable.  Leukocytosis resolved.  No need for antibiotics.  11.  Hyponatremia Likely secondary to hypovolemic hyponatremia as patient noted to have borderline/soft blood pressure postoperatively and noted to be anemic.  Status post transfusion 1 unit packed red blood cells with improvement with hyponatremia.  Sodium levels trended back down.  Patient with complaints of uncontrolled pain which Ganci be contributing to hyponatremia.  Patient also on SSRI.  Hold Lexapro for now.  Saline lock IV fluids.  Check urine sodium, urine creatinine, urine and serum osmolality, random cortisol level, continue Synthroid.  Check a weight.  Give a dose of Lasix 20 mg IV x1.  Repeat labs  this afternoon.  Follow.     DVT prophylaxis: Lovenox. Postop per orthopedics. Code Status: Full Family Communication: Updated patient and family at bedside.  Disposition:   Status is: Inpatient    Dispo: The patient is from: Home              Anticipated d/c is to: Likely SNF versus home  with home health              Anticipated d/c date is: 06/10/2020.              Patient status post right hip arthroplasty, assessed by PT awaiting SNF placement.  Not stable for discharge.    Difficult to place patient on the term       Consultants:   Orthopedics: Dr. Lyla Glassing 06/05/2020  Procedures  Plain films of the right hip and pelvis 06/04/2020  Plain films of the right knee 06/04/2020  Chest x-ray 06/04/2020  CT head 06/04/2020  Right total hip arthroplasty per Dr. Lyla Glassing 06/05/2020  Transfusion 1 unit packed red blood cells 06/06/2020  Antimicrobials:   None   Subjective: Alert.  Family at bedside.  Complaining of right hip pain.  States pain medication does not last long enough and if he was every 3 hours as needed hip pain might be better controlled.  Denied any chest pain.  No shortness of breath.  No abdominal pain.  Objective: Vitals:   06/07/20 0832 06/07/20 1556 06/07/20 2204 06/08/20 0555  BP:  139/66 130/68 (!) 143/84  Pulse:  70 78 73  Resp:  17 16 20   Temp:  98.2 F (36.8 C) 98.2 F (36.8 C) 98.2 F (36.8 C)  TempSrc:   Oral Oral  SpO2: 97% 92% 95% 97%  Weight:      Height:        Intake/Output Summary (Last 24 hours) at 06/08/2020 1130 Last data filed at 06/08/2020 0810 Gross per 24 hour  Intake 1060 ml  Output 1750 ml  Net -690 ml   Filed Weights   06/05/20 0043  Weight: 62.5 kg    Examination:  General exam: NAD. Respiratory system: CTAB anterior lung fields.  No wheezes, no crackles, no rhonchi.  Normal respiratory effort. Cardiovascular system: RRR no murmurs rubs or gallops.  No JVD.  No lower extremity edema.  Gastrointestinal system: Abdomen is soft, nontender, nondistended, positive bowel sounds.  No rebound.  No guarding.  Central nervous system: Alert and oriented. No focal neurological deficits. Extremities: Right hip less exquisitely tender to palpation.  Skin: No rashes, lesions or ulcers Psychiatry: Judgement and insight appear  normal. Mood & affect appropriate.     Data Reviewed: I have personally reviewed following labs and imaging studies  CBC: Recent Labs  Lab 06/04/20 1152 06/04/20 1911 06/06/20 0355 06/06/20 1517 06/07/20 0308 06/08/20 0311  WBC 16.1* 12.4* 7.8  --  5.6 5.9  NEUTROABS 14.4*  --  6.0  --   --   --   HGB 11.9* 11.5* 7.9* 7.3* 8.5* 8.8*  HCT 36.1 35.0* 24.7* 22.3* 25.6* 26.6*  MCV 92.1 92.1 94.6  --  91.8 93.0  PLT 249 246 172  --  135* [id number]    Basic Metabolic Panel: Recent Labs  Lab 06/04/20 1152 06/04/20 1911 06/06/20 0309 06/07/20 0308 06/08/20 0311  NA 138  --  129* 129* 126*  K 4.6  --  4.1 4.3 4.4  CL 105  --  100 103 103  CO2 21*  --  20*  19* 18*  GLUCOSE 135*  --  216* 144* 173*  BUN 14  --  11 9 9   CREATININE 0.83 0.66 0.71 0.65 0.57  CALCIUM 8.9  --  7.5* 7.1* 7.3*    GFR: Estimated Creatinine Clearance: 45.5 mL/min (by C-G formula based on SCr of 0.57 mg/dL).  Liver Function Tests: No results for input(s): AST, ALT, ALKPHOS, BILITOT, PROT, ALBUMIN in the last 168 hours.  CBG: Recent Labs  Lab 06/07/20 1127 06/07/20 1658 06/07/20 2015 06/07/20 2159 06/08/20 0745  GLUCAP 149* 199* 138* 170* 149*     Recent Results (from the past 240 hour(s))  SARS CORONAVIRUS 2 (TAT 6-24 HRS) Nasopharyngeal Nasopharyngeal Swab     Status: None   Collection Time: 06/04/20 12:04 PM   Specimen: Nasopharyngeal Swab  Result Value Ref Range Status   SARS Coronavirus 2 NEGATIVE NEGATIVE Final    Comment: (NOTE) SARS-CoV-2 target nucleic acids are NOT DETECTED.  The SARS-CoV-2 RNA is generally detectable in upper and lower respiratory specimens during the acute phase of infection. Negative results do not preclude SARS-CoV-2 infection, do not rule out co-infections with other pathogens, and should not be used as the sole basis for treatment or other patient management decisions. Negative results must be combined with clinical observations, patient history, and  epidemiological information. The expected result is Negative.  Fact Sheet for Patients: SugarRoll.be  Fact Sheet for Healthcare Providers: https://www.woods-mathews.com/  This test is not yet approved or cleared by the [place] FDA and  has been authorized for detection and/or diagnosis of SARS-CoV-2 by FDA under an Emergency Use Authorization (EUA). This EUA will remain  in effect (meaning this test can be used) for the duration of the COVID-19 declaration under Se ction 564(b)(1) of the Act, 21 U.S.C. section 360bbb-3(b)(1), unless the authorization is terminated or revoked sooner.  Performed at [facility], [place] [address], [place], [place] [address]   Surgical pcr screen     Status: None   Collection Time: 06/05/20  3:25 AM   Specimen: Nasal Mucosa; Nasal Swab  Result Value Ref Range Status   MRSA, PCR NEGATIVE NEGATIVE Final   Staphylococcus aureus NEGATIVE NEGATIVE Final    Comment: (NOTE) The Xpert SA Assay (FDA approved for NASAL specimens in patients 73 years of age and older), is one component of a comprehensive surveillance program. It is not intended to diagnose infection nor to guide or monitor treatment. Performed at [facility], [place] [address], [place], [place] [address]          Radiology Studies: No results found.      Scheduled Meds: . acetaminophen  500 mg Oral TID  . acetaminophen  650 mg Oral Once  . Chlorhexidine Gluconate Cloth  6 each Topical Daily  . cholecalciferol  5,000 Units Oral Daily  . cholestyramine light  2 g Oral BID  . enoxaparin (LOVENOX) injection  40 mg Subcutaneous Q24H  . feeding supplement (GLUCERNA SHAKE)  237 mL Oral BID  . insulin aspart  0-5 Units Subcutaneous QHS  . insulin aspart  0-9 Units Subcutaneous TID WC  . insulin glargine  7 Units Subcutaneous QHS  . levothyroxine  75 mcg Oral Q0600  . pantoprazole  40 mg Oral Q0600  . phenytoin   100 mg Oral q morning - 10a  . phenytoin  200 mg Oral QHS  . polyvinyl alcohol  2 drop Both Eyes Daily  . primidone  100 mg Oral QHS  . primidone  150 mg Oral q morning - 10a  . senna-docusate  1 tablet Oral BID  . vitamin B-1  250 mg Oral QHS   Continuous Infusions:    LOS: 4 days    Time spent: 35 minutes    Irine Seal, MD [facility]   To contact the attending provider between 7A-7P or the covering provider during after hours 7P-7A, please log into the web site [email or web address] and access using universal [place] password for that web site. If you do not have the password, please call the hospital operator.  06/08/2020, 11:30 AM

## 2020-06-08 NOTE — Progress Notes (Signed)
Physical Therapy Treatment Patient Details Name: [name] MRN: [id number] DOB: [birth year] Today's Date: 06/08/2020    History of Present Illness Leonard Hendler Tullius is an 87 y.o. female with a past medical history of chronic hyponatremia, IDDM2, hypothyroidism, hypertension, hyperlipidemia, left tib/fib fracture, breast cancer, brain tumor s/p surgery complicated with seizures, colon cancer, and admitted for closed right hip fracture.  Pt s/p R DA THA on 06/05/20.    PT Comments    Patient progressing with standing transfers and with step by step cues able to help some with bed mobility, though very slow to engage.  Feel she remains appropriate for SNF level rehab at d/c.  PT to continue to follow.    Follow Up Recommendations  SNF     Equipment Recommendations  None recommended by PT    Recommendations for Other Services       Precautions / Restrictions Precautions Precautions: Fall Restrictions RLE Weight Bearing: Weight bearing as tolerated    Mobility  Bed Mobility Overal bed mobility: Needs Assistance Bed Mobility: Supine to Sit     Supine to sit: Max assist;+2 for physical assistance     General bed mobility comments: cues for lifting hips to help scoot in supine, then assist to bring L leg off bed and to lift trunk with pt cued to use bed rail  Transfers Overall transfer level: Needs assistance Equipment used: Rolling walker (2 wheeled) Transfers: Sit to/from [facility] Sit to Stand: Mod assist;+2 safety/equipment Stand pivot transfers: Mod assist;+2 safety/equipment       General transfer comment: increased time and cues for momentum to stand, assist for safety with RW as moving/stpping to recliner  Ambulation/Gait                 Stairs             Wheelchair Mobility    Modified Rankin (Stroke Patients Only)       Balance Overall balance assessment: Needs assistance Sitting-balance support: Feet supported Sitting  balance-Leahy Scale: Fair     Standing balance support: Bilateral upper extremity supported Standing balance-Leahy Scale: Poor Standing balance comment: UE support for balance                            Cognition Arousal/Alertness: Awake/alert Behavior During Therapy: WFL for tasks assessed/performed Overall Cognitive Status: Within Functional Limits for tasks assessed                                        Exercises Total Joint Exercises Ankle Circles/Pumps: AROM;10 reps;Both;Supine Quad Sets: AROM;5 reps;Both;Supine Short Arc Quad: AROM;5 reps;Both;Supine Heel Slides: AROM;AAROM;Both;5 reps;Supine    General Comments        Pertinent Vitals/Pain Pain Assessment: Faces Faces Pain Scale: Hurts whole lot Pain Location: right hip/thigh Pain Descriptors / Indicators: Grimacing;Discomfort;Sore Pain Intervention(s): Monitored during session;Repositioned;Limited activity within patient's tolerance    Home Living                      Prior Function            PT Goals (current goals can now be found in the care plan section) Progress towards PT goals: Progressing toward goals    Frequency    Min 3X/week      PT Plan Current plan remains appropriate  Co-evaluation              AM-PAC PT "6 Clicks" Mobility   Outcome Measure  Help needed turning from your back to your side while in a flat bed without using bedrails?: Total Help needed moving from lying on your back to sitting on the side of a flat bed without using bedrails?: Total Help needed moving to and from a bed to a chair (including a wheelchair)?: A Lot Help needed standing up from a chair using your arms (e.g., wheelchair or bedside chair)?: A Lot Help needed to walk in hospital room?: Total Help needed climbing 3-5 steps with a railing? : Total 6 Click Score: 8    End of Session Equipment Utilized During Treatment: Gait belt Activity Tolerance: Patient  limited by pain Patient left: in chair;with call bell/phone within reach;with chair alarm set;with family/visitor present   PT Visit Diagnosis: Difficulty in walking, not elsewhere classified (R26.2);Unsteadiness on feet (R26.81)     Time: 9892-1194 PT Time Calculation (min) (ACUTE ONLY): 21 min  Charges:  $Therapeutic Activity: 8-22 mins                     Magda Kiel, PT Acute Rehabilitation Services RDEYC:[phone] Office:[phone] 06/08/2020    Reginia Naas 06/08/2020, 3:00 PM

## 2020-06-08 NOTE — TOC Progression Note (Signed)
Transition of Care [facility]) - Progression Note    Patient Details  Name: [name] MRN: [id number] Date of Birth: 03-14-1930  Transition of Care [facility]) CM/SW Contact  Joaquin Courts, RN Phone Number: 06/08/2020, 11:31 AM  Clinical Narrative:    CM spoke with patient's niece by phone and patient and her sister at bedside.  CM discussed discharge planning options or private pay SNF vs home with private pay care.  At this time patient and sister feel private pay snf is a better options, they feel patient will receive more therapy and be able to recover quicker.  CM spoke with patient and discussed need to expand facility search beyond the three facilities originally faxed to as there [place] be barriers including limited bed availability and covid.  With permission CM faxed information to all facilities except [facility] and [facility], patient states she has been there before and does not want to return.  CM also discussed need for a plan B, should none of the facilities be available or if the patient does not like her options.  CM discussed with patient the need to look into home care agencies to see if they can provide staff to cover 24/7 needs.  TOC will continue to follow and provide bed offers once available.  Expected Discharge Plan: [place] (vs SNF) Barriers to Discharge: Continued Medical Work up  Expected Discharge Plan and Services Expected Discharge Plan: [place] (vs SNF) In-house Referral: Clinical Social Work     Living arrangements for the past 2 months: [place] (Home a "few weeks" from SNF)                                       Social Determinants of Health (SDOH) Interventions    Readmission Risk Interventions Readmission Risk Prevention Plan 06/06/2020 03/15/2020 03/13/2020  Transportation Screening Complete Complete -  PCP or Specialist Appt within 3-5 Days - Complete Complete  HRI or [place] - -  Complete  Social Work Consult for [place] Planning/Counseling - Complete Complete  Palliative Care Screening - Complete Complete  Medication Review Press photographer) - Complete Complete  PCP or Specialist appointment within 3-5 days of discharge Complete - -  SW Recovery Care/Counseling Consult Complete - -  Palliative Care Screening Not Applicable - -  Some recent data might be hidden

## 2020-06-09 DIAGNOSIS — K219 Gastro-esophageal reflux disease without esophagitis: Secondary | ICD-10-CM | POA: Diagnosis not present

## 2020-06-09 DIAGNOSIS — F419 Anxiety disorder, unspecified: Secondary | ICD-10-CM | POA: Diagnosis not present

## 2020-06-09 DIAGNOSIS — I10 Essential (primary) hypertension: Secondary | ICD-10-CM | POA: Diagnosis not present

## 2020-06-09 DIAGNOSIS — S72001A Fracture of unspecified part of neck of right femur, initial encounter for closed fracture: Secondary | ICD-10-CM | POA: Diagnosis not present

## 2020-06-09 LAB — BASIC METABOLIC PANEL
Anion gap: 10 (ref 5–15)
BUN: 10 mg/dL (ref 8–23)
CO2: 22 mmol/L (ref 22–32)
Calcium: 8.2 mg/dL — ABNORMAL LOW (ref 8.9–10.3)
Chloride: 99 mmol/L (ref 98–111)
Creatinine, Ser: 0.53 mg/dL (ref 0.44–1.00)
GFR, Estimated: >60 mL/min (ref >60)
Glucose, Bld: 150 mg/dL — ABNORMAL HIGH (ref 70–99)
Potassium: 4 mmol/L (ref 3.5–5.1)
Sodium: 131 mmol/L — ABNORMAL LOW (ref 135–145)

## 2020-06-09 LAB — GLUCOSE, CAPILLARY
Glucose-Capillary: 158 mg/dL — ABNORMAL HIGH (ref 70–99)
Glucose-Capillary: 251 mg/dL — ABNORMAL HIGH (ref 70–99)
Glucose-Capillary: 236 mg/dL — ABNORMAL HIGH (ref 70–99)
Glucose-Capillary: 256 mg/dL — ABNORMAL HIGH (ref 70–99)

## 2020-06-09 NOTE — Progress Notes (Signed)
     Subjective: 4 Days Post-Op Procedure(s) (LRB): ARTHROPLASTY  ANTERIOR APPROACH. RIGHT HIP (Right)   Patient reports pain as moderate, controlled medication.  No reported events throughout the night.  Discussed the procedure and expectations moving forward.     Objective:   VITALS:   Vitals:   06/08/20 2104 06/09/20 0526  BP: (!) 141/72 (!) 148/87  Pulse: 86 79  Resp: 17 16  Temp: 99 F (37.2 C) 98.4 F (36.9 C)  SpO2: 96% 93%    Dorsiflexion/Plantar flexion intact Incision: dressing C/D/I No cellulitis present Compartment soft  LABS Recent Labs    06/06/20 1517 06/07/20 0308 06/08/20 0311  HGB 7.3* 8.5* 8.8*  HCT 22.3* 25.6* 26.6*  WBC  --  5.6 5.9  PLT  --  135* 170    Recent Labs    06/08/20 0311 06/08/20 0853 06/09/20 0304  NA 126* 132* 131*  K 4.4 4.5 4.0  BUN 9 8 10   CREATININE 0.57 0.55 0.53  GLUCOSE 173* 143* 150*     Assessment/Plan: 4 Days Post-Op Procedure(s) (LRB): ARTHROPLASTY  ANTERIOR APPROACH. RIGHT HIP (Right)   Up with therapy Discharge disposition to be determined        Danae Orleans PA-C  [facility]  [facility] [address], [address], [place], [place] [address] Phone: [phone] [email or web address] Facebook  [employer]

## 2020-06-09 NOTE — Progress Notes (Signed)
PROGRESS NOTE    [name]  YIR:[id number] DOB: 1929/06/03 DOA: 06/04/2020 PCP: Bartholome Bill, MD    Chief Complaint  Patient presents with  . Fall    Brief Narrative:  Patient is a pleasant 87 year old female history of hypertension, hyperlipidemia, benign meningioma status post resection, OA, B12 deficiency, vitamin D deficiency, history of right breast cancer status post lumpectomy and radiation, seizure, anxiety, insulin-dependent diabetes presented to the hospital after mechanical fall sustaining a right hip fracture with complaints of severe right hip pain and inability to bear weight.  Head CT done unremarkable.  Plain films of the right hip and pelvis with a displaced right femoral neck fracture.  Orthopedics consulted and plan is for surgical repair today 06/05/2020.   Assessment & Plan:   Principal Problem:   Closed right hip fracture (HCC) Active Problems:   Insulin-requiring or dependent type II diabetes mellitus ([place])   Essential hypertension   GERD   Anxiety   Seizure disorder (HCC)   Tremor, essential   Hyponatremia   Leukocytosis   Postoperative anemia due to acute blood loss  1 closed right hip fracture Secondary to mechanical fall. Patient seen in consultation by orthopedics.  Patient subsequently underwent right total hip arthroplasty per Dr. Lyla Glassing 06/05/2020.  Patient with improvement with right hip pain.  Patient complaining today that right hip pain not fully managed and states pain medication not lasting long enough.  Continue oxycodone to 2.5 mg every 3 hours as needed.  Continue scheduled Tylenol.  Vitamin D 25 hydroxy at 47.59.  PT/OT.   Pain management per orthopedics.   Per orthopedics.   2.  Postop acute blood loss anemia Patient with borderline blood pressure postop with hemoglobin dropping as low as 7.3 from 11.9 on presentation.  Patient status post transfusion 1 unit packed red blood cells hemoglobin currently at 8.8.  Status post IV  Feraheme.  PPI.  Transfusion threshold hemoglobin < 7.   3.  Well-controlled insulin-dependent diabetes mellitus type 2 Hemoglobin A1c 6.4.  CBG 158 this morning.  Continue Lantus 7 units daily.  Sliding scale insulin.  Continue current diet.    4.  Hypothyroidism TSH within normal limits.  Continue Synthroid.  Outpatient follow-up.   5.  Hypertension Blood pressure was borderline.  Antihypertensive medications on hold.  Blood pressure improved with transfusion and IV fluids.  Status post IV Lasix.  Borderline.  Antihypertensive medications on hold.  BP improved with transfusion and IV fluids.  Patient received a dose of IV Lasix today.  Could likely resume antihypertensive medications in the next 1 to 2 days.  Follow.    6.  Gastroesophageal reflux disease Continue PPI.    7.  Depression/anxiety Discontinued Lexapro secondary to hyponatremia.  Anxiolytics as needed.   8.  Seizure disorder Continue Dilantin.    9.  Essential tremor Continue primidone.   10.  Leukocytosis Likely reactive leukocytosis secondary to problem #1.  Patient afebrile.  No urinary symptoms.  Chest x-ray unremarkable.  Urinalysis unremarkable.  Leukocytosis resolved.  No need for antibiotics.  11.  Hyponatremia Likely secondary to hypovolemic hyponatremia as patient noted to have borderline/soft blood pressure postoperatively and noted to be anemic.  Status post transfusion 1 unit packed red blood cells with improvement with hyponatremia.  Sodium levels trended back down.  Patient with complaints of uncontrolled pain which [place] be contributing to hyponatremia.  Patient also on SSRI.  Continue to hold Lexapro.  IV fluids have been saline lock.  Urine  sodium of 74, urine creatinine of 15.92, urine osmolality of 229.  TSH within normal limits.  Cortisol level randomly done was 12.0.  Continue home regimen Synthroid.  Status post IV Lasix x1 with improvement with renal function and sodium currently at 131.    DVT  prophylaxis: Lovenox. Postop per orthopedics. Code Status: Full Family Communication: Updated patient.  No family at bedside.   Disposition:   Status is: Inpatient    Dispo: The patient is from: Home              Anticipated d/c is to: Likely SNF versus home with home health              Anticipated d/c date is: 06/10/2020.              Patient status post right hip arthroplasty, assessed by PT awaiting SNF placement.  Not stable for discharge.    Difficult to place patient on the term       Consultants:   Orthopedics: Dr. Lyla Glassing 06/05/2020  Procedures  Plain films of the right hip and pelvis 06/04/2020  Plain films of the right knee 06/04/2020  Chest x-ray 06/04/2020  CT head 06/04/2020  Right total hip arthroplasty per Dr. Lyla Glassing 06/05/2020  Transfusion 1 unit packed red blood cells 06/06/2020  Antimicrobials:   None   Subjective: Sitting up in bed.  Feeling better.  States adjustment with pain medication help manage her right hip pain better.  No chest pain.  No shortness of breath.  Some complaints of some intermittent right groin pain.  Stated had to wait 3 hours overnight when she asked for pain medication for it to be given to her.   Overall feeling better.  Objective: Vitals:   06/08/20 1100 06/08/20 1300 06/08/20 2104 06/09/20 0526  BP:  (!) 151/81 (!) 141/72 (!) 148/87  Pulse:  80 86 79  Resp:  17 17 16   Temp:  98 F (36.7 C) 99 F (37.2 C) 98.4 F (36.9 C)  TempSrc:  Oral    SpO2:  99% 96% 93%  Weight: 73.1 kg     Height:        Intake/Output Summary (Last 24 hours) at 06/09/2020 1038 Last data filed at 06/09/2020 0929 Gross per 24 hour  Intake 800 ml  Output 3900 ml  Net -3100 ml   Filed Weights   06/05/20 0043 06/08/20 1100  Weight: 62.5 kg 73.1 kg    Examination:  General exam: : NAD Respiratory system: CTAB anterior lung fields.  No wheezes, no rhonchi.  Speaking in full sentences.  Normal respiratory effort. Cardiovascular system: Regular  rate and rhythm no murmurs rubs or gallops.  No JVD.  No lower extremity edema.  Gastrointestinal system: Abdomen soft, nontender, nondistended, positive bowel sounds.  No rebound.  No guarding. Central nervous system: Alert and oriented. No focal neurological deficits. Extremities: Right hip less exquisitely tender to palpation.  No lower extremity edema.  Skin: No rashes, lesions or ulcers Psychiatry: Judgement and insight appear normal. Mood & affect appropriate.   Data Reviewed: I have personally reviewed following labs and imaging studies  CBC: Recent Labs  Lab 06/04/20 1152 06/04/20 1911 06/06/20 0355 06/06/20 1517 06/07/20 0308 06/08/20 0311  WBC 16.1* 12.4* 7.8  --  5.6 5.9  NEUTROABS 14.4*  --  6.0  --   --   --   HGB 11.9* 11.5* 7.9* 7.3* 8.5* 8.8*  HCT 36.1 35.0* 24.7* 22.3* 25.6* 26.6*  MCV 92.1  92.1 94.6  --  91.8 93.0  PLT 249 246 172  --  135* [id number]    Basic Metabolic Panel: Recent Labs  Lab 06/06/20 0309 06/07/20 0308 06/08/20 0311 06/08/20 0853 06/09/20 0304  NA 129* 129* 126* 132* 131*  K 4.1 4.3 4.4 4.5 4.0  CL 100 103 103 105 99  CO2 20* 19* 18* 21* 22  GLUCOSE 216* 144* 173* 143* 150*  BUN 11 9 9 8 10   CREATININE 0.71 0.65 0.57 0.55 0.53  CALCIUM 7.5* 7.1* 7.3* 7.8* 8.2*    GFR: Estimated Creatinine Clearance: 49.7 mL/min (by C-G formula based on SCr of 0.53 mg/dL).  Liver Function Tests: No results for input(s): AST, ALT, ALKPHOS, BILITOT, PROT, ALBUMIN in the last 168 hours.  CBG: Recent Labs  Lab 06/08/20 0745 06/08/20 1150 06/08/20 1727 06/08/20 2106 06/09/20 0735  GLUCAP 149* 229* 236* 322* 158*     Recent Results (from the past 240 hour(s))  SARS CORONAVIRUS 2 (TAT 6-24 HRS) Nasopharyngeal Nasopharyngeal Swab     Status: None   Collection Time: 06/04/20 12:04 PM   Specimen: Nasopharyngeal Swab  Result Value Ref Range Status   SARS Coronavirus 2 NEGATIVE NEGATIVE Final    Comment: (NOTE) SARS-CoV-2 target nucleic acids are  NOT DETECTED.  The SARS-CoV-2 RNA is generally detectable in upper and lower respiratory specimens during the acute phase of infection. Negative results do not preclude SARS-CoV-2 infection, do not rule out co-infections with other pathogens, and should not be used as the sole basis for treatment or other patient management decisions. Negative results must be combined with clinical observations, patient history, and epidemiological information. The expected result is Negative.  Fact Sheet for Patients: SugarRoll.be  Fact Sheet for Healthcare Providers: https://www.woods-mathews.com/  This test is not yet approved or cleared by the [place] FDA and  has been authorized for detection and/or diagnosis of SARS-CoV-2 by FDA under an Emergency Use Authorization (EUA). This EUA will remain  in effect (meaning this test can be used) for the duration of the COVID-19 declaration under Se ction 564(b)(1) of the Act, 21 U.S.C. section 360bbb-3(b)(1), unless the authorization is terminated or revoked sooner.  Performed at [facility], [place] [address], [place], [place] [address]   Surgical pcr screen     Status: None   Collection Time: 06/05/20  3:25 AM   Specimen: Nasal Mucosa; Nasal Swab  Result Value Ref Range Status   MRSA, PCR NEGATIVE NEGATIVE Final   Staphylococcus aureus NEGATIVE NEGATIVE Final    Comment: (NOTE) The Xpert SA Assay (FDA approved for NASAL specimens in patients 42 years of age and older), is one component of a comprehensive surveillance program. It is not intended to diagnose infection nor to guide or monitor treatment. Performed at [facility], [place] [address], [place],  [address]          Radiology Studies: No results found.      Scheduled Meds: . acetaminophen  500 mg Oral TID  . acetaminophen  650 mg Oral Once  . Chlorhexidine Gluconate Cloth  6 each Topical Daily  .  cholecalciferol  5,000 Units Oral Daily  . cholestyramine light  2 g Oral BID  . enoxaparin (LOVENOX) injection  40 mg Subcutaneous Q24H  . feeding supplement (GLUCERNA SHAKE)  237 mL Oral BID  . insulin aspart  0-5 Units Subcutaneous QHS  . insulin aspart  0-9 Units Subcutaneous TID WC  . insulin glargine  7 Units Subcutaneous  QHS  . levothyroxine  75 mcg Oral Q0600  . pantoprazole  40 mg Oral Q0600  . phenytoin  100 mg Oral q morning - 10a  . phenytoin  200 mg Oral QHS  . polyvinyl alcohol  2 drop Both Eyes Daily  . primidone  100 mg Oral QHS  . primidone  150 mg Oral q morning - 10a  . senna-docusate  1 tablet Oral BID  . vitamin B-1  250 mg Oral QHS   Continuous Infusions:    LOS: 5 days    Time spent: 35 minutes    Irine Seal, MD [facility]   To contact the attending provider between 7A-7P or the covering provider during after hours 7P-7A, please log into the web site [email or web address] and access using universal [place] password for that web site. If you do not have the password, please call the hospital operator.  06/09/2020, 10:38 AM

## 2020-06-09 NOTE — Plan of Care (Signed)
  Problem: Education: Goal: Knowledge of General Education information will improve Description Including pain rating scale, medication(s)/side effects and non-pharmacologic comfort measures Outcome: Progressing   Problem: Health Behavior/Discharge Planning: Goal: Ability to manage health-related needs will improve Outcome: Progressing   

## 2020-06-09 NOTE — Plan of Care (Signed)
  Problem: Education: Goal: Knowledge of General Education information will improve Description: Including pain rating scale, medication(s)/side effects and non-pharmacologic comfort measures Outcome: Progressing   Problem: Clinical Measurements: Goal: Cardiovascular complication will be avoided Outcome: Progressing   Problem: Clinical Measurements: Goal: Will remain free from infection Outcome: Progressing

## 2020-06-10 ENCOUNTER — Other Ambulatory Visit: Payer: Self-pay | Admitting: Orthopedic Surgery

## 2020-06-10 ENCOUNTER — Telehealth: Payer: Self-pay | Admitting: Neurology

## 2020-06-10 DIAGNOSIS — R296 Repeated falls: Secondary | ICD-10-CM | POA: Insufficient documentation

## 2020-06-10 DIAGNOSIS — S72001P Fracture of unspecified part of neck of right femur, subsequent encounter for closed fracture with malunion: Secondary | ICD-10-CM | POA: Diagnosis not present

## 2020-06-10 DIAGNOSIS — C4490 Unspecified malignant neoplasm of skin, unspecified: Secondary | ICD-10-CM | POA: Insufficient documentation

## 2020-06-10 DIAGNOSIS — F419 Anxiety disorder, unspecified: Secondary | ICD-10-CM | POA: Diagnosis not present

## 2020-06-10 DIAGNOSIS — Z794 Long term (current) use of insulin: Secondary | ICD-10-CM | POA: Insufficient documentation

## 2020-06-10 DIAGNOSIS — E1165 Type 2 diabetes mellitus with hyperglycemia: Secondary | ICD-10-CM

## 2020-06-10 DIAGNOSIS — I519 Heart disease, unspecified: Secondary | ICD-10-CM | POA: Insufficient documentation

## 2020-06-10 DIAGNOSIS — Z96641 Presence of right artificial hip joint: Secondary | ICD-10-CM

## 2020-06-10 DIAGNOSIS — K219 Gastro-esophageal reflux disease without esophagitis: Secondary | ICD-10-CM | POA: Diagnosis not present

## 2020-06-10 DIAGNOSIS — I10 Essential (primary) hypertension: Secondary | ICD-10-CM | POA: Diagnosis not present

## 2020-06-10 LAB — CBC WITH DIFFERENTIAL/PLATELET
Abs Immature Granulocytes: 0.07 10*3/uL (ref 0.00–0.07)
Basophils Absolute: 0 10*3/uL (ref 0.0–0.1)
Basophils Relative: 0 %
Eosinophils Absolute: 0.2 10*3/uL (ref 0.0–0.5)
Eosinophils Relative: 4 %
HCT: 28.2 % — ABNORMAL LOW (ref 36.0–46.0)
Hemoglobin: 9.4 g/dL — ABNORMAL LOW (ref 12.0–15.0)
Immature Granulocytes: 1 %
Lymphocytes Relative: 22 %
Lymphs Abs: 1.3 10*3/uL (ref 0.7–4.0)
MCH: 30.6 pg (ref 26.0–34.0)
MCHC: 33.3 g/dL (ref 30.0–36.0)
MCV: 91.9 fL (ref 80.0–100.0)
Monocytes Absolute: 0.5 10*3/uL (ref 0.1–1.0)
Monocytes Relative: 8 %
Neutro Abs: 3.7 10*3/uL (ref 1.7–7.7)
Neutrophils Relative %: 65 %
Platelets: 264 10*3/uL (ref 150–400)
RBC: 3.07 MIL/uL — ABNORMAL LOW (ref 3.87–5.11)
RDW: 15.6 % — ABNORMAL HIGH (ref 11.5–15.5)
WBC: 5.7 10*3/uL (ref 4.0–10.5)
nRBC: 0 % (ref 0.0–0.2)

## 2020-06-10 LAB — BASIC METABOLIC PANEL
Anion gap: 8 (ref 5–15)
BUN: 10 mg/dL (ref 8–23)
CO2: 23 mmol/L (ref 22–32)
Calcium: 8.2 mg/dL — ABNORMAL LOW (ref 8.9–10.3)
Chloride: 97 mmol/L — ABNORMAL LOW (ref 98–111)
Creatinine, Ser: 0.57 mg/dL (ref 0.44–1.00)
GFR, Estimated: >60 mL/min (ref >60)
Glucose, Bld: 265 mg/dL — ABNORMAL HIGH (ref 70–99)
Potassium: 4 mmol/L (ref 3.5–5.1)
Sodium: 128 mmol/L — ABNORMAL LOW (ref 135–145)

## 2020-06-10 LAB — SARS CORONAVIRUS 2 (TAT 6-24 HRS): SARS Coronavirus 2: NEGATIVE

## 2020-06-10 LAB — GLUCOSE, CAPILLARY
Glucose-Capillary: 170 mg/dL — ABNORMAL HIGH (ref 70–99)
Glucose-Capillary: 344 mg/dL — ABNORMAL HIGH (ref 70–99)
Glucose-Capillary: 242 mg/dL — ABNORMAL HIGH (ref 70–99)
Glucose-Capillary: 164 mg/dL — ABNORMAL HIGH (ref 70–99)

## 2020-06-10 MED ORDER — ALPRAZOLAM 0.25 MG PO TABS: 0.2500 mg | ORAL_TABLET | Freq: Every evening | ORAL | 0 refills | Status: DC | PRN

## 2020-06-10 MED ORDER — TRAMADOL HCL 50 MG PO TABS: 50.0000 mg | ORAL_TABLET | ORAL | 0 refills | Status: DC | PRN

## 2020-06-10 MED ORDER — SENNOSIDES-DOCUSATE SODIUM 8.6-50 MG PO TABS: 1.0000 | ORAL_TABLET | Freq: Two times a day (BID) | ORAL | Status: DC

## 2020-06-10 MED ORDER — ACETAMINOPHEN 500 MG PO TABS: 500.0000 mg | ORAL_TABLET | Freq: Three times a day (TID) | ORAL | 0 refills | Status: AC

## 2020-06-10 MED ORDER — PANTOPRAZOLE SODIUM 40 MG PO TBEC: 40.0000 mg | DELAYED_RELEASE_TABLET | Freq: Every day | ORAL | 0 refills | Status: DC

## 2020-06-10 MED ORDER — ESCITALOPRAM OXALATE 10 MG PO TABS: 10.0000 mg | ORAL_TABLET | Freq: Every day | ORAL | Status: DC

## 2020-06-10 MED ORDER — OXYCODONE HCL 5 MG PO TABS
2.5000 mg | ORAL_TABLET | ORAL | Status: DC | PRN
Start: 2020-06-10 — End: 2020-06-11
  Administered 2020-06-10: 2.5 mg via ORAL
  Filled 2020-06-10: qty 1

## 2020-06-10 MED ORDER — OXYCODONE HCL 5 MG PO TABS: 5.0000 mg | ORAL_TABLET | Freq: Four times a day (QID) | ORAL | 0 refills | Status: AC | PRN

## 2020-06-10 MED ORDER — FUROSEMIDE 10 MG/ML IJ SOLN
20.0000 mg | Freq: Once | INTRAMUSCULAR | Status: AC
  Administered 2020-06-10: 20 mg via INTRAVENOUS
  Filled 2020-06-10: qty 2

## 2020-06-10 MED ORDER — LIDOCAINE 5 % EX PTCH: 1.0000 | MEDICATED_PATCH | CUTANEOUS | 0 refills | Status: DC

## 2020-06-10 MED ORDER — INSULIN GLARGINE 100 UNIT/ML ~~LOC~~ SOLN
7.0000 [IU] | Freq: Once | SUBCUTANEOUS | Status: AC
  Administered 2020-06-10: 7 [IU] via SUBCUTANEOUS
  Filled 2020-06-10: qty 0.07

## 2020-06-10 NOTE — Telephone Encounter (Signed)
[facility]) called, patient is in the hospital at [facility]. We need to clarify the dosage for phenytoin (DILANTIN) ER capsule 100 mg Phenytoin (DILANTIN) ER capsule 200 mg.  Contact info: [phone]

## 2020-06-10 NOTE — Telephone Encounter (Signed)
I called and talk with the pharmacist.  The patient is on 150 mg of Dilantin in the morning and 200 mg in the evening, she takes one 50 mg tablet in the morning with a 100 mg capsule and in the evening takes 2 of the 100 mg capsules.

## 2020-06-10 NOTE — Progress Notes (Signed)
Report called to [facility]

## 2020-06-10 NOTE — Telephone Encounter (Signed)
Called pharmacist back, reached [employer].  Patient is being discharged today and they are confused to what her actual Dilantin dosage is.  Unable to verify through office notes, looks like the extra 50 of dilantin for morning, expired 3/21.  Message forwarded to Dr. Jannifer Franklin for clarification.

## 2020-06-10 NOTE — TOC Transition Note (Signed)
Transition of Care [facility]) - CM/SW Discharge Note   Patient Details  Name: [name] MRN: [id number] Date of Birth: 19-Dec-1929  [facility] of [facility] CM/SW Contact:  Lennart Pall, LCSW Phone Number: 06/10/2020, 12:49 PM   Clinical Narrative:     Pt has accepted SNF bed at [facility] and nephew will assist in making private payment to facility today.  Plan to transport via [facility] once dc summary completed.  Pt and family in agreement.  No further TOC needs.  Final next level of care: Skilled Nursing Facility Barriers to Discharge: Barriers Resolved   Patient Goals and CMS Choice Patient states their goals for this hospitalization and ongoing recovery are:: to go home CMS Medicare.gov Compare Post Acute Care list provided to:: Patient Choice offered to / list presented to : [facility] / [place]  Discharge Placement              [facility] bed at: [facility] Patient to be transferred to facility by: [facility] Name of family member notified: niece and nephew Patient and family notified of of transfer: 06/10/20  Discharge Plan and Services In-house Referral: Clinical Social Work              DME Arranged: N/A DME Agency: NA                  Social Determinants of Health ([place]) Interventions     Readmission Risk Interventions Readmission Risk Prevention Plan 06/06/2020 03/15/2020 03/13/2020  Transportation Screening Complete Complete -  PCP or Specialist Appt within 3-5 Days - Complete Complete  HRI or Home Care Consult - - Complete  Social Work Consult for [place] Planning/Counseling - Complete Complete  Palliative Care Screening - Complete Complete  Medication Review [employer]) - Complete Complete  PCP or Specialist appointment within 3-5 days of discharge Complete - -  SW Recovery Care/Counseling Consult Complete - -  Palliative Care Screening Not Applicable - -  Some recent data might be hidden

## 2020-06-10 NOTE — Discharge Summary (Signed)
Physician Discharge Summary  [name] WNI:[id number] DOB: [birth year] DOA: 06/04/2020  PCP: Bartholome Bill, MD  Admit date: 06/04/2020 Discharge date: 06/10/2020  Time spent: 60 minutes  Recommendations for Outpatient Follow-up:  1. Follow-up with Dr. Lyla Glassing, orthopedics in 2 weeks. 2. Follow-up with MD at skilled nursing facility.  Patient will need a basic metabolic profile done in 1 week to follow-up on electrolytes and renal function.  Patient need a CBC done in 1 week to follow-up on H&H.   Discharge Diagnoses:  Principal Problem:   Closed right hip fracture ([place]) Active Problems:   Insulin-requiring or dependent type II diabetes mellitus ([place])   Essential hypertension   GERD   Anxiety   Seizure disorder (HCC)   Tremor, essential   Hyponatremia   Leukocytosis   Postoperative anemia due to acute blood loss   Discharge Condition: Stable and improved  Diet recommendation: Carb modified diet  Filed Weights   06/05/20 0043 06/08/20 1100 06/09/20 1500  Weight: 62.5 kg 73.1 kg 71.1 kg    History of present illness:  HPI per Dr. Darnell [name] [name] is a 87 y.o. female with medical history significant of hypertension, hyperlipidemia, history of benign meningioma (status post resection), osteoarthritis, B12 deficiency, vitamin D deficiency, history of right breast cancer (status post lumpectomy and radiation), seizure, anxiety and insulin-dependent diabetes; who presented to the hospital secondary to bone weakness mechanical fall at home with subsequent severe right hip pain and inability to bear weight.  Patient reports no loss of consciousness, no chest pain, no nausea, no vomiting, no shortness of breath, no fever, no chills, no focal weakness, no sick contacts, no dysuria, no hematuria, or any other complaints.  ED Course: Elevated WBCs appreciated on her blood work; CT head demonstrating no acute abnormalities.  X-ray of the hip confirming moderate displaced right femoral  neck fracture.  Orthopedic service was consulted and the plan is for surgical repair on 06/05/2020; [facility] has been contacted to facilitate admission and further evaluation and management.  Review of Systems: As per HPI otherwise all other systems reviewed and are negative.   Hospital Course:  1 closed right hip fracture Secondary to mechanical fall. Patient seen in consultation by orthopedics.  Patient subsequently underwent right total hip arthroplasty per Dr. Lyla Glassing 06/05/2020.  Patient with improvement with right hip pain.  Patient complaining early on during the hospitalization of pain not well managed and pain medication adjusted to every 3 hours as needed.  Patient also placed on scheduled Tylenol.  Vitamin D 25 hydroxy at 47.59.  PT/OT.    Pain management per orthopedics.  PT OT recommended skilled nursing facility placement which patient be discharged to on.  Outpatient follow-up with orthopedics in 2 weeks.  2.  Postop acute blood loss anemia Patient with borderline blood pressure postop with hemoglobin dropping as low as 7.3 from 11.9 on presentation.  Patient status post transfusion 1 unit packed red blood cells hemoglobin stabilized at 9.4 by day of discharge.  Patient also received a dose of IV Feraheme during the hospitalization.  Outpatient follow-up.   3.  Well-controlled insulin-dependent diabetes mellitus type 2 Hemoglobin A1c 6.4.    Patient's Lantus dose was decreased to half the dose at 7 units daily during the hospitalization patient maintained on sliding scale insulin.  Home dose Lantus will be resumed at 14 units on day of discharge.  Outpatient follow-up.  4.  Hypothyroidism TSH within normal limits.    Patient maintained on  home regimen Synthroid.  Outpatient follow-up.    5.  Hypertension Blood pressure was borderline.  Antihypertensive medications were held initially during the hospitalization.  Blood pressure improved with transfusion and IV fluids.   Status post IV Lasix.   Blood pressure improved and patient's antihypertensive medications will be resumed on discharge.  Outpatient follow-up.  6.  Gastroesophageal reflux disease Patient maintained on PPI during the hospitalization.  7.  Depression/anxiety Discontinued Lexapro secondary to hyponatremia.  Anxiolytics as needed.  Lexapro will be resumed 3 to 4 days post discharge.  8.  Seizure disorder Continued on home regimen Dilantin.  Tramadol will be discontinued on discharge.  9.  Essential tremor Continued on home regimen primidone.   10.  Leukocytosis Likely reactive leukocytosis secondary to problem #1.  Patient afebrile.  No urinary symptoms.  Chest x-ray unremarkable.  Urinalysis unremarkable.  Leukocytosis resolved.  No need for antibiotics.  11.    Acute on chronic hyponatremia Likely secondary to hypovolemic hyponatremia as patient noted to have borderline/soft blood pressure postoperatively and noted to be anemic.  Status post transfusion 1 unit packed red blood cells with improvement with hyponatremia.  Sodium levels trended back down.  Patient with complaints of uncontrolled pain which Grunden be contributing to hyponatremia.  Patient also on SSRI.    Patient's Lexapro was held and will be resumed 3 days post discharge.  IV fluids with saline lock.  Urine studies obtained. Urine sodium of 74, urine creatinine of 15.92, urine osmolality of 229.  TSH within normal limits.  Cortisol [name] randomly done was 12.0.  Continued on home regimen Synthroid.    Patient given doses of IV Lasix with improvement with sodium levels.  Patient's home regimen diuretics were held during the hospitalization will be resumed on discharge.  Patient will need repeat labs done in about 1 week.  Outpatient follow-up.    Procedures:  Plain films of the right hip and pelvis 06/04/2020  Plain films of the right knee 06/04/2020  Chest x-ray 06/04/2020  CT head 06/04/2020  Right total hip arthroplasty  per Dr. Lyla Glassing 06/05/2020  Transfusion 1 unit packed red blood cells 06/06/2020    Consultations:  Orthopedics: Dr. Lyla Glassing 06/05/2020    Discharge Exam: Vitals:   06/10/20 0453 06/10/20 1311  BP: (!) 148/69 (!) 143/76  Pulse: 68 94  Resp: 14 18  Temp: 98.6 F (37 C) 97.7 F (36.5 C)  SpO2: 100% 100%    General: NAD Cardiovascular: RRR Respiratory: CTAB  Discharge Instructions   Discharge Instructions    Diet Carb Modified   Complete by: As directed    Discharge wound care:   Complete by: As directed    As above   Increase activity slowly   Complete by: As directed      Allergies as of 06/10/2020      Reactions   Bystolic [nebivolol Hcl] Other (See Comments)   "extreme weakness, heaviness in legs & arms, swelling in legs/arms/face, swollen abdomen, pain in bladder, feet pain, soreness in chest"   Carbamazepine Other (See Comments)   Blood poisoning Other reaction(s): Unknown   Cholestyramine Other (See Comments)   "itching rash on stomach, bloated, nausea, vomiting, sleeplessness, extreme pain in arms"   Hydrazine Yellow [tartrazine] Other (See Comments)   "does not reduce high blood pressure, pain in arm, high pressure, felt like I was on verge of heart attack, really weak"   Morphine Other (See Comments)   Feels morbid, weak, still in pain   Niacin  Palpitations   Fast heart beat Other reaction(s): Unknown   Niaspan [niacin Er] Palpitations, Other (See Comments)   "fast heart beat, high blood pressure"   Norvasc [amlodipine Besylate] Other (See Comments)   "extreme fluid retention/pain)   Optivar [azelastine Hcl] Photosensitivity   Repaglinide Hives   Sular [nisoldipine Er] Other (See Comments)   "severe headaches, swelling eyes, hands, feet, shortness of breath, weak, flushed face, brain boiling, fluid retention, high blood sugar, nervous, heart fast beating"   Telmisartan Other (See Comments)   "headache, difficulty urinating, high blood sugar, fluid  retention"   Amoxicillin-pot Clavulanate Rash, Other (See Comments)   Cefdinir Swelling   Vaginal irritation, breathing,    Ciprofloxacin Hcl Hives   Clonidine Other (See Comments)   Dry mouth, fluid retention   Clonidine Hydrochloride Other (See Comments)   Dry mouth, fluid retention   Codeine Nausea And Vomiting   Ezetimibe Other (See Comments)   Made weak Other reaction(s): Unknown   Hydroxychloroquine Other (See Comments)   Low platlets   Naproxen Other (See Comments)   Shrinks bladder   Sulfa Antibiotics Rash   Ziac [bisoprolol-hydrochlorothiazide] Other (See Comments)   "stopped urination"   Amlodipine Besy-benazepril Hcl    Other reaction(s): cough   Azelastine Other (See Comments)   Unknown reaction - Per MAR   Elavil [amitriptyline] Other (See Comments)   Gave Pt nightmares   Empagliflozin Other (See Comments)   "Caused yeast infection, slowed my urine"   Hydralazine Other (See Comments)   Unknown reaction - Per MAR   Iodine I 131 Tositumomab Other (See Comments)   Unknown reaction - Per MAR   Kenalog [triamcinolone] Diarrhea   Per MAR   Keppra [levetiracetam] Other (See Comments)   Shaking   Lamotrigine Itching, Other (See Comments)   Norvasc [amlodipine] Other (See Comments)   Unknown reaction - Per MAR   Other Other (See Comments)   Other reaction(s): Unknown Other reaction(s): Unknown Other reaction(s): Unknown Other reaction(s): Unknown Other reaction(s): Unknown   Pregabalin Swelling   Other reaction(s): weight gain   Pseudoephedrine    Pseudoephedrine-guaifenesin Er Other (See Comments)   Unknown reaction - Per MAR   Risedronate    Unknown reaction - Per MAR   Ru-hist D [brompheniramine-phenylephrine] Other (See Comments)   Unknown reaction - Per MAR   Sumatriptan Other (See Comments)   Topiramate Other (See Comments)   Dry eyes Other reaction(s): Unknown   Ace Inhibitors Other (See Comments)   unknown   Actonel [risedronate Sodium] Other  (See Comments)   unknown   Amlodipine Besylate Other (See Comments)   unknown   Aspirin Other (See Comments)   unknown   Atacand [candesartan] Other (See Comments)   Unknown reaction - MAR   Bextra [valdecoxib] Other (See Comments)   Unknown reaction - Per MAR   Bisoprolol-hydrochlorothiazide Other (See Comments)   Unknown reaction - Per MAR   Candesartan Cilexetil Other (See Comments)   unknown   Cefadroxil Other (See Comments)   unknown   Celecoxib Rash   Hydrocodone Other (See Comments)   unknown   Hydrocodone-acetaminophen Other (See Comments)   unknown   Iodinated Diagnostic Agents Rash   "All over" Other reaction(s): Unknown   Meloxicam Other (See Comments)   unknown   Methylprednisolone Sodium Succinate Other (See Comments)   unknown   Nabumetone Other (See Comments)   Unknown reaction   Penicillins Other (See Comments)   unknown   Pseudoephedrine-guaifenesin Other (See Comments)   unknown  Risedronate Sodium Other (See Comments)   unknown   Rofecoxib Other (See Comments)   Unknown reaction - MAR Other reaction(s): Unknown   Ru-tuss [chlorphen-pse-atrop-hyos-scop] Other (See Comments)   unknown   Sulfonamide Derivatives Other (See Comments)   unknown   Sulphur [elemental Sulfur] Other (See Comments)   unknown   Telithromycin Other (See Comments)   unknown   Terfenadine Other (See Comments)   Unknown reaction - Per MAR   Trandolapril-verapamil Hcl Er Other (See Comments)   Headache, difficulty urinating, high blood sugar, fluid retention Pt is taking Tarka (trandolapril-verapamil) currently, but requests the medication stay in her allergy list   Trandolapril-verapamil Hcl Er Other (See Comments)   Headache, difficulty urinating, high blood sugar, fluid retention Pt is taking Tarka (trandolapril-verapamil) currently, but requests the medication stay in her allergy list   Valium [diazepam] Other (See Comments)   Makes her mean and hyper      Medication  List    STOP taking these medications   oxyCODONE-acetaminophen 5-325 MG tablet Commonly known as: Percocet   traMADol 50 MG tablet Commonly known as: ULTRAM     TAKE these medications   acetaminophen 500 MG tablet Commonly known as: TYLENOL Take 1 tablet (500 mg total) by mouth 3 (three) times daily for 7 days.   ALPRAZolam 0.25 MG tablet Commonly known as: XANAX Take 1 tablet (0.25 mg total) by mouth at bedtime as needed. TAKE 1 TABLET AT BEDTIME AS NEEDED (CAN TAKE 1 TABLET DURING THE DAY IF NEEDED FOR ANXIETY)   aspirin 81 MG chewable tablet Commonly known as: Aspirin Childrens Chew 1 tablet (81 mg total) by mouth 2 (two) times daily with a meal.   Biotin 1000 MCG tablet Take 1,000 mcg by mouth daily.   cholestyramine 4 g packet Commonly known as: QUESTRAN Take 4 g by mouth as needed (diarrhea).   cyanocobalamin 1000 MCG/ML injection Commonly known as: (VITAMIN B-12) INJECT 1 ML INTRAMUSCULARLY EVERY 21 DAYS What changed: See the new instructions.   diclofenac Sodium 1 % Gel Commonly known as: VOLTAREN Apply 2 g topically as needed (knee pain).   escitalopram 10 MG tablet Commonly known as: LEXAPRO Take 1 tablet (10 mg total) by mouth daily. Start taking on: June 13, 2020 What changed: These instructions start on June 13, 2020. If you are unsure what to do until then, ask your doctor or other care provider.   feeding supplement (GLUCERNA SHAKE) Liqd Take 237 mLs by mouth 2 (two) times daily.   furosemide 20 MG tablet Commonly known as: LASIX Take 20 mg by mouth daily.   Glucose Meter Test test strip Generic drug: glucose blood See admin instructions.   PRODIGY VOICE BLOOD GLUCOSE VI   insulin aspart 100 UNIT/ML FlexPen Commonly known as: NOVOLOG Inject 2-12 Units into the skin See admin instructions. Injects 2-10 units of insulin under the skin 3 times a day per sliding scale: CBG 0-150: 2 units; CBG 151-200: 4 units; CBG 201-250: 6 units; CBG  251-300: 8 units; CBG 301-350: 10 units; CBG 351-400: 12 units; CBG >400: 12 units and notify the MD/NP   Insulin Pen Needle 32G X 4 MM Misc Used to inject insulin 3x daily   Lantus SoloStar 100 UNIT/ML Solostar Pen Generic drug: insulin glargine Inject 14 Units into the skin at bedtime.   levothyroxine 75 MCG tablet Commonly known as: SYNTHROID Take 75 mcg by mouth daily before breakfast.   lidocaine 5 % Commonly known as: LIDODERM Place 1 patch  onto the skin daily. Remove & Discard patch within 12 hours or as directed by MD   ondansetron 4 MG disintegrating tablet Commonly known as: ZOFRAN-ODT Take 1 tablet (4 mg total) by mouth every 6 (six) hours as needed for nausea.   oxyCODONE 5 MG immediate release tablet Commonly known as: Oxy IR/ROXICODONE Take 1 tablet (5 mg total) by mouth every 6 (six) hours as needed for up to 7 days for moderate pain or severe pain.   pantoprazole 40 MG tablet Commonly known as: PROTONIX Take 1 tablet (40 mg total) by mouth daily at 6 (six) AM. Start taking on: June 11, 2020   phenytoin 100 MG ER capsule Commonly known as: DILANTIN Take 1 capsule (100 mg) in the morning and Take 2 capsules (200 mg) at bedtime What changed:   how much to take  how to take this  when to take this  additional instructions   phenytoin 50 MG tablet Commonly known as: DILANTIN Chew 50 mg by mouth daily. Take with 173m capsule for a total of 1587mevery morning.   primidone 50 MG tablet Commonly known as: MYSOLINE Three tablets in the morning and 2 at dinner time What changed:   how much to take  how to take this  when to take this  additional instructions   Prodigy Voice Blood Glucose w/Device Kit Use to check blood sugar 1 time per day.   senna-docusate 8.6-50 MG tablet Commonly known as: Senokot-S Take 1 tablet by mouth 2 (two) times daily.   Systane 0.4-0.3 % Soln Generic drug: Polyethyl Glycol-Propyl Glycol Place 2 drops into both  eyes as needed (dry eyes).   trandolapril-verapamil 2-240 MG tablet Commonly known as: TARKA Take 1 tablet by mouth at bedtime.   vitamin B-1 250 MG tablet Take 250 mg by mouth at bedtime.   Vitamin D-3 125 MCG (5000 UT) Tabs Take 5,000 Units by mouth daily.            Discharge Care Instructions  (From admission, onward)         Start     Ordered   06/10/20 0000  Discharge wound care:       Comments: As above   06/10/20 1321         Allergies  Allergen Reactions  . Bystolic [Nebivolol Hcl] Other (See Comments)    "extreme weakness, heaviness in legs & arms, swelling in legs/arms/face, swollen abdomen, pain in bladder, feet pain, soreness in chest"  . Carbamazepine Other (See Comments)    Blood poisoning  Other reaction(s): Unknown  . Cholestyramine Other (See Comments)    "itching rash on stomach, bloated, nausea, vomiting, sleeplessness, extreme pain in arms"  . Hydrazine Yellow [Tartrazine] Other (See Comments)    "does not reduce high blood pressure, pain in arm, high pressure, felt like I was on verge of heart attack, really weak"  . Morphine Other (See Comments)    Feels morbid, weak, still in pain  . Niacin Palpitations    Fast heart beat Other reaction(s): Unknown  . Niaspan [Niacin Er] Palpitations and Other (See Comments)    "fast heart beat, high blood pressure"  . Norvasc [Amlodipine Besylate] Other (See Comments)    "extreme fluid retention/pain)  . Optivar [Azelastine Hcl] Photosensitivity  . Repaglinide Hives  . Sular [Nisoldipine Er] Other (See Comments)    "severe headaches, swelling eyes, hands, feet, shortness of breath, weak, flushed face, brain boiling, fluid retention, high blood sugar, nervous, heart fast beating"  .  Telmisartan Other (See Comments)    "headache, difficulty urinating, high blood sugar, fluid retention"  . Amoxicillin-Pot Clavulanate Rash and Other (See Comments)  . Cefdinir Swelling    Vaginal irritation, breathing,    . Ciprofloxacin Hcl Hives  . Clonidine Other (See Comments)    Dry mouth, fluid retention  . Clonidine Hydrochloride Other (See Comments)    Dry mouth, fluid retention  . Codeine Nausea And Vomiting  . Ezetimibe Other (See Comments)    Made weak Other reaction(s): Unknown  . Hydroxychloroquine Other (See Comments)    Low platlets  . Naproxen Other (See Comments)    Shrinks bladder  . Sulfa Antibiotics Rash  . Ziac [Bisoprolol-Hydrochlorothiazide] Other (See Comments)    "stopped urination"  . Amlodipine Besy-Benazepril Hcl     Other reaction(s): cough  . Azelastine Other (See Comments)    Unknown reaction - Per MAR  . Elavil [Amitriptyline] Other (See Comments)    Gave Pt nightmares  . Empagliflozin Other (See Comments)    "Caused yeast infection, slowed my urine"  . Hydralazine Other (See Comments)    Unknown reaction - Per MAR  . Iodine I 131 Tositumomab Other (See Comments)    Unknown reaction - Per MAR  . Kenalog [Triamcinolone] Diarrhea    Per MAR  . Keppra [Levetiracetam] Other (See Comments)    Shaking  . Lamotrigine Itching and Other (See Comments)  . Norvasc [Amlodipine] Other (See Comments)    Unknown reaction - Per MAR  . Other Other (See Comments)    Other reaction(s): Unknown Other reaction(s): Unknown Other reaction(s): Unknown Other reaction(s): Unknown Other reaction(s): Unknown  . Pregabalin Swelling    Other reaction(s): weight gain  . Pseudoephedrine   . Pseudoephedrine-Guaifenesin Er Other (See Comments)    Unknown reaction - Per MAR  . Risedronate     Unknown reaction - Per MAR  . Ru-Hist D [Brompheniramine-Phenylephrine] Other (See Comments)    Unknown reaction - Per MAR  . Sumatriptan Other (See Comments)  . Topiramate Other (See Comments)    Dry eyes Other reaction(s): Unknown  . Ace Inhibitors Other (See Comments)    unknown  . Actonel [Risedronate Sodium] Other (See Comments)    unknown  . Amlodipine Besylate Other (See Comments)     unknown  . Aspirin Other (See Comments)    unknown  . Atacand [Candesartan] Other (See Comments)    Unknown reaction - MAR  . Bextra [Valdecoxib] Other (See Comments)    Unknown reaction - Per MAR  . Bisoprolol-Hydrochlorothiazide Other (See Comments)    Unknown reaction - Per MAR  . Candesartan Cilexetil Other (See Comments)    unknown  . Cefadroxil Other (See Comments)    unknown  . Celecoxib Rash  . Hydrocodone Other (See Comments)    unknown  . Hydrocodone-Acetaminophen Other (See Comments)    unknown  . Iodinated Diagnostic Agents Rash    "All over"  Other reaction(s): Unknown  . Meloxicam Other (See Comments)    unknown  . Methylprednisolone Sodium Succinate Other (See Comments)    unknown  . Nabumetone Other (See Comments)    Unknown reaction  . Penicillins Other (See Comments)    unknown  . Pseudoephedrine-Guaifenesin Other (See Comments)    unknown  . Risedronate Sodium Other (See Comments)    unknown  . Rofecoxib Other (See Comments)    Unknown reaction - MAR Other reaction(s): Unknown  . Ru-Tuss [Chlorphen-Pse-Atrop-Hyos-Scop] Other (See Comments)    unknown  . Sulfonamide  Derivatives Other (See Comments)    unknown  . Sulphur [Elemental Sulfur] Other (See Comments)    unknown  . Telithromycin Other (See Comments)    unknown  . Terfenadine Other (See Comments)    Unknown reaction - Per MAR  . Trandolapril-Verapamil Hcl Er Other (See Comments)    Headache, difficulty urinating, high blood sugar, fluid retention  Pt is taking Tarka (trandolapril-verapamil) currently, but requests the medication stay in her allergy list  . Trandolapril-Verapamil Hcl Er Other (See Comments)    Headache, difficulty urinating, high blood sugar, fluid retention  Pt is taking Tarka (trandolapril-verapamil) currently, but requests the medication stay in her allergy list  . Valium [Diazepam] Other (See Comments)    Makes her mean and hyper    Contact information for  follow-up providers    Swinteck, Aaron Edelman, MD. Schedule an appointment as soon as possible for a visit in 2 weeks.   Specialty: Orthopedic Surgery Why: For wound re-check, For suture removal Contact information: [address] [place] [address] [phone]        MD AT SNF Follow up.            Contact information for after-discharge care    Destination    [facility] Preferred SNF .   Service: Skilled Nursing Contact information: [address] [place] [phone]                   The results of significant diagnostics from this hospitalization (including imaging, microbiology, ancillary and laboratory) are listed below for reference.    Significant Diagnostic Studies: CT Head Wo Contrast  Result Date: 06/04/2020 CLINICAL DATA:  Unwitnessed fall. EXAM: CT HEAD WITHOUT CONTRAST TECHNIQUE: Contiguous axial images were obtained from the base of the skull through the vertex without intravenous contrast. COMPARISON:  April 21, 2020. FINDINGS: Brain: Stable right frontal encephalomalacia consistent with postoperative change. No mass effect or midline shift is noted. Ventricular size is within normal limits. There is no evidence of mass lesion, hemorrhage or acute infarction. Vascular: No hyperdense vessel or unexpected calcification. Skull: Status post right frontal craniotomy. No acute abnormality is noted. Sinuses/Orbits: No acute finding. Other: None. IMPRESSION: Stable right frontal encephalomalacia consistent with postoperative change. No acute intracranial abnormality seen. Electronically Signed   By: Marijo Conception M.D.   On: 06/04/2020 12:18   Pelvis Portable  Result Date: 06/05/2020 CLINICAL DATA:  Right hip pain. EXAM: PORTABLE PELVIS 1-2 VIEWS COMPARISON:  None. FINDINGS: Generalized osteopenia. Right total hip arthroplasty with postsurgical changes in the surrounding soft tissues. No fracture or  dislocation. IMPRESSION: Right total hip arthroplasty. Electronically Signed   By: Kathreen Devoid   On: 06/05/2020 14:51   DG Chest Port 1 View  Result Date: 06/04/2020 CLINICAL DATA:  Fall.  Fractured hip.  Preop. EXAM: PORTABLE CHEST 1 VIEW COMPARISON:  04/01/2020 FINDINGS: Mild right hemidiaphragm elevation. Numerous leads and wires project over the chest. Midline trachea. Normal heart size. No pleural effusion or pneumothorax. Clear lungs. IMPRESSION: No acute cardiopulmonary disease. Electronically Signed   By: Abigail Miyamoto M.D.   On: 06/04/2020 12:59   DG Knee Right Port  Result Date: 06/04/2020 CLINICAL DATA:  Right femoral neck fracture EXAM: PORTABLE RIGHT KNEE - 1-2 VIEW COMPARISON:  None. FINDINGS: Alignment is anatomic. There is no acute fracture. No joint effusion. Mild degenerative changes are present. IMPRESSION: No acute fracture. Electronically Signed   By: Addison Lank.D.  On: 06/04/2020 17:05   DG C-Arm 1-60 Min-No Report  Result Date: 06/05/2020 Fluoroscopy was utilized by the requesting physician.  No radiographic interpretation.   US THYROID  Result Date: 06/03/2020 CLINICAL DATA:  Prior ultrasound follow-up. EXAM: THYROID ULTRASOUND TECHNIQUE: Ultrasound examination of the thyroid gland and adjacent soft tissues was performed. COMPARISON:  04/24/2014 FINDINGS: Parenchymal Echotexture: Moderately heterogenous Isthmus: 0.2 cm, previously 0.2 cm Right lobe: 4.5 x 1.1 x 1.3 cm, previously 4.7 x 1.4 x 1.9 cm Left lobe: 4.4 x 1.6 x 2.3 cm, previously 4.4 x 1.9 x 2.6 cm _________________________________________________________ Estimated total number of nodules >/= 1 cm: 2 Number of spongiform nodules >/=  2 cm not described below (TR1): 0 Number of mixed cystic and solid nodules >/= 1.5 cm not described below ([place]): 0 _________________________________________________________ Nodule # 1: Prior biopsy: No Location: Right; Mid Maximum size: 1.0 cm; Other 2 dimensions: 0.9 x 0.5 cm,  previously, 1.7 x 1.7 x 0.8 cm Composition: solid/almost completely solid (2) Echogenicity: hypoechoic (2) Shape: not taller-than-wide (0) Margins: ill-defined (0) Echogenic foci: none (0) ACR TI-RADS total points: 4. ACR TI-RADS risk category:  TR4 (4-6 points). Significant change in size (>/= 20% in two dimensions and minimal increase of 2 mm): No Change in features: Yes Change in ACR TI-RADS risk category: Yes ACR TI-RADS recommendations: This nodule does NOT meet TI-RADS criteria for biopsy or dedicated follow-up given 6 years stability. _________________________________________________________ Nodule # 2: Prior biopsy: No Location: Left; Superior Maximum size: 0.8 cm; Other 2 dimensions: 0.6 x 0.4 cm, previously, 0.9 x 0.6 x 0.5 cm Composition: solid/almost completely solid (2) Echogenicity: hypoechoic (2) Shape: not taller-than-wide (0) Margins: smooth (0) Echogenic foci: none (0) ACR TI-RADS total points: 4. ACR TI-RADS risk category:  TR4 (4-6 points). Significant change in size (>/= 20% in two dimensions and minimal increase of 2 mm): No Change in features: No Change in ACR TI-RADS risk category: No ACR TI-RADS recommendations: Given size (<0.9 cm) and appearance, this nodule does NOT meet TI-RADS criteria for biopsy or dedicated follow-up. _________________________________________________________ Nodule # 3: Prior biopsy: No Location: Left; Mid Maximum size: 2.5 cm; Other 2 dimensions: 2.4 x 1.4 cm, previously, 2.4 x 2.3 x 1.3 cm Composition: mixed cystic and solid (1) Echogenicity: isoechoic (1) Shape: not taller-than-wide (0) Margins: smooth (0) Echogenic foci: none (0) ACR TI-RADS total points: 2. ACR TI-RADS risk category:  TR2 (2 points). Significant change in size (>/= 20% in two dimensions and minimal increase of 2 mm): No Change in features: No Change in ACR TI-RADS risk category: No ACR TI-RADS recommendations: This nodule does NOT meet TI-RADS criteria for biopsy or dedicated follow-up.  _________________________________________________________ IMPRESSION: 1. Similar-appearing multinodular goiter. 2. The dominant nodule in the left mid thyroid (labeled 3, 2.5 cm, TI-RADS category 2) remains benign in appearance. No dedicated ultrasound follow-up or tissue sampling is recommended. 3. Decreased size of previously visualized right mid thyroid nodule (labeled 1, TI-RADS category 4) from 1.7 cm to 1.0 cm, now declared benign. No dedicated ultrasound follow-up or tissue sampling is recommended. The above is in keeping with the ACR TI-RADS recommendations - J Am Coll Radiol 2017;14:587-595. Ruthann Cancer, MD [facility] Electronically Signed   By: Ruthann Cancer MD   On: 06/03/2020 12:33   DG HIP OPERATIVE UNILAT W OR W/O PELVIS RIGHT  Result Date: 06/05/2020 CLINICAL DATA:  Right anterior hip. EXAM: OPERATIVE RIGHT HIP (WITH PELVIS IF PERFORMED) 9 VIEWS TECHNIQUE: Fluoroscopic spot image(s) were  submitted for interpretation post-operatively. COMPARISON:  June 04, 2020. FINDINGS: Fluoro time: 13 seconds. Nine C-arm fluoroscopic images were obtained intraoperatively and submitted for post operative interpretation. These images demonstrate surgical change relating to total right hip arthroplasty. No unexpected findings. Please see the performing provider's procedural report for further detail. IMPRESSION: Intraoperative fluoroscopic imaging, as detailed above. Electronically Signed   By: Margaretha Sheffield MD   On: 06/05/2020 14:13   DG Hip Unilat With Pelvis 2-3 Views Right  Result Date: 06/04/2020 CLINICAL DATA:  Right hip pain after fall today. EXAM: DG HIP (WITH OR WITHOUT PELVIS) 2-3V RIGHT COMPARISON:  None. FINDINGS: Moderately displaced proximal right femoral neck fracture is noted. IMPRESSION: Moderately displaced proximal right femoral neck fracture. Electronically Signed   By: Marijo Conception M.D.   On: 06/04/2020 12:31     Microbiology: Recent Results (from the past 240 hour(s))  SARS CORONAVIRUS 2 (TAT 6-24 HRS) Nasopharyngeal Nasopharyngeal Swab     Status: None   Collection Time: 06/04/20 12:04 PM   Specimen: Nasopharyngeal Swab  Result Value Ref Range Status   SARS Coronavirus 2 NEGATIVE NEGATIVE Final    Comment: (NOTE) SARS-CoV-2 target nucleic acids are NOT DETECTED.  The SARS-CoV-2 RNA is generally detectable in upper and lower respiratory specimens during the acute phase of infection. Negative results do not preclude SARS-CoV-2 infection, do not rule out co-infections with other pathogens, and should not be used as the sole basis for treatment or other patient management decisions. Negative results must be combined with clinical observations, patient history, and epidemiological information. The expected result is Negative.  Fact Sheet for Patients: SugarRoll.be  Fact Sheet for Healthcare Providers: https://www.woods-mathews.com/  This test is not yet approved or cleared by the [place] FDA and  has been authorized for detection and/or diagnosis of SARS-CoV-2 by FDA under an Emergency Use Authorization (EUA). This EUA will remain  in effect (meaning this test can be used) for the duration of the COVID-19 declaration under Se ction 564(b)(1) of the Act, 21 U.S.C. section 360bbb-3(b)(1), unless the authorization is terminated or revoked sooner.  Performed at [facility], [place] [address], [place], [place] [address]   Surgical pcr screen     Status: None   Collection Time: 06/05/20  3:25 AM   Specimen: Nasal Mucosa; Nasal Swab  Result Value Ref Range Status   MRSA, PCR NEGATIVE NEGATIVE Final   Staphylococcus aureus NEGATIVE NEGATIVE Final    Comment: (NOTE) The Xpert SA Assay (FDA approved for NASAL specimens in patients 14 years of age and older), is one component of a comprehensive surveillance program. It is not intended to  diagnose infection nor to guide or monitor treatment. Performed at [facility], [place] [address], [place], [place] [address]   SARS CORONAVIRUS 2 (TAT 6-24 HRS) Nasopharyngeal Nasopharyngeal Swab     Status: None   Collection Time: 06/09/20  6:19 PM   Specimen: Nasopharyngeal Swab  Result Value Ref Range Status   SARS Coronavirus 2 NEGATIVE NEGATIVE Final    Comment: (NOTE) SARS-CoV-2 target nucleic acids are NOT DETECTED.  The SARS-CoV-2 RNA is generally detectable in upper and lower respiratory specimens during the acute phase of infection. Negative results do not preclude SARS-CoV-2 infection, do not rule out co-infections with other pathogens, and should not be used as the sole basis for treatment or other patient management decisions. Negative results must be combined with clinical observations, patient history, and epidemiological information. The expected result is Negative.  Fact Sheet for Patients: SugarRoll.be  Fact Sheet for Healthcare Providers: https://www.woods-mathews.com/  This test is not yet approved or cleared by the [place] FDA and  has been authorized for detection and/or diagnosis of SARS-CoV-2 by FDA under an Emergency Use Authorization (EUA). This EUA will remain  in effect (meaning this test can be used) for the duration of the COVID-19 declaration under Se ction 564(b)(1) of the Act, 21 U.S.C. section 360bbb-3(b)(1), unless the authorization is terminated or revoked sooner.  Performed at [facility], [place] [address] [place], [place] [address]      Labs: Basic Metabolic Panel: Recent Labs  Lab 06/07/20 0308 06/08/20 0311 06/08/20 0853 06/09/20 0304 06/10/20 0255  NA 129* 126* 132* 131* 128*  K 4.3 4.4 4.5 4.0 4.0  CL 103 103 105 99 97*  CO2 19* 18* 21* 22 23  GLUCOSE 144* 173* 143* 150* 265*  BUN '9 9 8 10 10  ' CREATININE 0.65 0.57 0.55 0.53 0.57  CALCIUM 7.1* 7.3*  7.8* 8.2* 8.2*   Liver Function Tests: No results for input(s): AST, ALT, ALKPHOS, BILITOT, PROT, ALBUMIN in the last 168 hours. No results for input(s): LIPASE, AMYLASE in the last 168 hours. No results for input(s): AMMONIA in the last 168 hours. CBC: Recent Labs  Lab 06/04/20 1152 06/04/20 1911 06/06/20 0355 06/06/20 1517 06/07/20 0308 06/08/20 0311 06/10/20 0255  WBC 16.1* 12.4* 7.8  --  5.6 5.9 5.7  NEUTROABS 14.4*  --  6.0  --   --   --  3.7  HGB 11.9* 11.5* 7.9* 7.3* 8.5* 8.8* 9.4*  HCT 36.1 35.0* 24.7* 22.3* 25.6* 26.6* 28.2*  MCV 92.1 92.1 94.6  --  91.8 93.0 91.9  PLT 249 246 172  --  135* 170 264   Cardiac Enzymes: No results for input(s): CKTOTAL, CKMB, CKMBINDEX, TROPONINI in the last 168 hours. BNP: BNP (last 3 results) No results for input(s): BNP in the last 8760 hours.  ProBNP (last 3 results) No results for input(s): PROBNP in the last 8760 hours.  CBG: Recent Labs  Lab 06/09/20 1124 06/09/20 1657 06/09/20 2055 06/10/20 0728 06/10/20 1105  GLUCAP 251* 236* 256* 170* 344*       Signed:  Irine Seal MD.  [facility] 06/10/2020, 1:35 PM

## 2020-06-10 NOTE — Care Management Important Message (Signed)
Important Message  Patient Details IM Letter placed in Patient's door [employer]. Name: [name] MRN: [id number] Date of Birth: 1929-05-16   Medicare Important Message Given:  Yes     Kerin Salen 06/10/2020, 3:00 PM

## 2020-06-11 ENCOUNTER — Non-Acute Institutional Stay (SKILLED_NURSING_FACILITY): Payer: Medicare Other | Admitting: Orthopedic Surgery

## 2020-06-11 ENCOUNTER — Encounter: Payer: Self-pay | Admitting: Orthopedic Surgery

## 2020-06-11 DIAGNOSIS — Z96641 Presence of right artificial hip joint: Secondary | ICD-10-CM | POA: Diagnosis not present

## 2020-06-11 DIAGNOSIS — D62 Acute posthemorrhagic anemia: Secondary | ICD-10-CM | POA: Diagnosis not present

## 2020-06-11 DIAGNOSIS — E538 Deficiency of other specified B group vitamins: Secondary | ICD-10-CM | POA: Diagnosis not present

## 2020-06-11 DIAGNOSIS — I10 Essential (primary) hypertension: Secondary | ICD-10-CM

## 2020-06-11 DIAGNOSIS — Z794 Long term (current) use of insulin: Secondary | ICD-10-CM

## 2020-06-11 DIAGNOSIS — E1165 Type 2 diabetes mellitus with hyperglycemia: Secondary | ICD-10-CM

## 2020-06-11 DIAGNOSIS — F419 Anxiety disorder, unspecified: Secondary | ICD-10-CM

## 2020-06-11 DIAGNOSIS — Z87898 Personal history of other specified conditions: Secondary | ICD-10-CM

## 2020-06-11 DIAGNOSIS — E039 Hypothyroidism, unspecified: Secondary | ICD-10-CM

## 2020-06-11 DIAGNOSIS — R2681 Unsteadiness on feet: Secondary | ICD-10-CM | POA: Insufficient documentation

## 2020-06-11 DIAGNOSIS — M6281 Muscle weakness (generalized): Secondary | ICD-10-CM | POA: Insufficient documentation

## 2020-06-11 NOTE — Progress Notes (Signed)
Location:    [facility] Room Number: 188/C Place of Service:  SNF [phone]) Provider: Windell Moulding NP   Bartholome Bill, MD  Patient Care Team: Bartholome Bill, MD as PCP - General (Family Medicine)  Extended Emergency Contact Information Primary Emergency Contact: [place] Phone: [phone] Relation: Niece Secondary Emergency Contact: Smith,Ida          [place], [place] [address] [place] Phone: [phone] Relation: Sister  Code Status:  Full Code Goals of care: Advanced Directive information Advanced Directives 06/11/2020  Does Patient Have a Medical Advance Directive? No  Type of Advance Directive -  Does patient want to make changes to medical advance directive? No - Patient declined  Copy of [place] in Chart? -  Would patient like information on creating a medical advance directive? -  Pre-existing out of facility DNR order (yellow form or pink MOST form) -     Chief Complaint  Patient presents with  . Hospitalization Follow-up    Hospitalization Follow Up     HPI:  Pt is a 87 y.o. female seen today for a hospital f/u s/p admission from [facility] 02/01-02/07.   She is currently a resident of [facility] since 02/07. PMH includes: hypertension, hyperlipidemia, benign meningioma, osteoarthritis, B12 and D deficiency, right sides breast cancer (s/p lumpectomy and radiation), seizure, anxiety, and indulin dependent diabetes.   02/01 she had a mechanical fall at home, c/o severe right hip pain and unable to wear weight on right extremity. No other injury reported. In the ED, WBC 16.1. HGB 11.9. NA 138. K 4.6. Glucose 135. BUN 14. Creat 0.83. Calcium 8.9. CT head- no acute intracranial abnormality. CT chest- no cardiopulmonary disease. US thyroid- multinodular goiter with left dominant nodule in mid lobe- biopsy recommended, decreased right nodule  (from 1.7 cm to 1.0 cm). Right hip xray with moderately displaced proximal right femoral neck fracture. 02/02 she underwent right total hip arthroplasty, anterior approach by Dr. Rod Can. She tolerated procedure well. Post operative pain was not well managed and narcotic pain medicine adjusted to every 3 hours and tylenol scheduled. Started on 81 mg asa po bid for dvt prophylaxis. In addition, she was given 1 unit PRBC due to post opearative anemia. Also received IV feraheme. Diabetes well controlled with 7 units Lantus daily and sliding scale insulin. PT recommended snf for additional therapy. Advised to follow up with orthopedics in 2 weeks.   Today, she is sleeping in her recliner. She is easily aroused. Oriented x 3. Follows commands and can express needs. C/o right hip pain at surgical site. Describes it as constant and burning. At this time she remains a two person assist with transfers and moderate assistance with ADL's. Appetite fair. She has had one bowel movement. Plan to go home after rehab stay. No recent falls, seizures or hypoglycemic events.  She denies sob or chest pain.   Facility nurse does not report any concerns, vitals stable.   Past Medical History:  Diagnosis Date  . Anemia   . Anxiety   . Asthma   . Breast cancer ([place]) 2014   right breast  . Cancer of right breast ([place]) 12/26/12   right breast 12:00 o'clock, DCIS  . Carotid artery disease ([place])   . Carpal tunnel syndrome, bilateral   . Chronic bronchitis ([place])   . Chronic cough   . Chronic facial pain   .  Chronic foot pain   . Colon cancer ([place]) dx'd 11/2019  . Complication of anesthesia    Sore jaw; could not chew or move mouth, prolonged sedation  . Convulsions/seizures ([place]) 10/16/2014  . Diabetes mellitus    type 2 niddm x 20 years  . Dyslipidemia   . Ejection fraction   . Gait abnormality 12/04/2019  . GERD (gastroesophageal reflux disease)   . Hammer toe    bilateral  . History of colonic polyps   .  History of meningioma   . HTN (hypertension)   . Hx of radiation therapy 03/07/13- 03/29/13   right breast 4250 cGy 17 sessions  . Hyperlipidemia   . Hypokalemia   . Hyponatremia   . Hypothyroidism   . IBS (irritable bowel syndrome)   . Melanoma ([place])   . Metatarsal bone fracture 2014  . Multiple drug allergies   . Nontoxic thyroid nodule   . Obesity   . Osteoarthritis   . Osteoporosis   . Palpitations   . Personal history of radiation therapy 2014  . Seasonal allergies   . Skin cancer   . Syncope   . Tremor, essential 08/18/2016  . Vitamin B12 deficiency   . Vitamin D deficiency    Past Surgical History:  Procedure Laterality Date  . ABDOMINAL HYSTERECTOMY    . BIOPSY  11/07/2019   Procedure: BIOPSY;  Surgeon: Ronald Lobo, MD;  Location: [facility];  Service: Endoscopy;;  . BRAIN SURGERY    . BREAST BIOPSY Right 01/24/2013   Procedure: RE-EXCICION OF BREAST CANCER, ANTERIOR MARGINS;  Surgeon: Edward Jolly, MD;  Location: [facility];  Service: General;  Laterality: Right;  . BREAST LUMPECTOMY Right 2014  . BREAST LUMPECTOMY WITH NEEDLE LOCALIZATION Right 01/17/2013   Procedure: BREAST LUMPECTOMY WITH NEEDLE LOCALIZATION;  Surgeon: Edward Jolly, MD;  Location: [facility];  Service: General;  Laterality: Right;  . BUNIONECTOMY Bilateral   . CATARACT EXTRACTION W/ INTRAOCULAR LENS IMPLANT Right   . CHOLECYSTECTOMY    . COLONOSCOPY    . COLONOSCOPY WITH PROPOFOL N/A 11/07/2019   Procedure: COLONOSCOPY WITH PROPOFOL;  Surgeon: Ronald Lobo, MD;  [facility];  Service: Endoscopy;  Laterality: N/A;  . CRANIOTOMY Right 10/18/2013   Procedure: CRANIOTOMY TUMOR EXCISION;  Surgeon: Floyce Stakes, MD;  Location: [facility];  Service: Neurosurgery;  Laterality: Right;  . ESOPHAGOGASTRODUODENOSCOPY (EGD) WITH PROPOFOL N/A 11/07/2019   Procedure: ESOPHAGOGASTRODUODENOSCOPY (EGD) WITH PROPOFOL;  Surgeon: Ronald Lobo, MD;  Location: [facility];  Service:  Endoscopy;  Laterality: N/A;  . EYE SURGERY    . HERNIA REPAIR    . IR THORACENTESIS ASP PLEURAL SPACE W/IMG GUIDE  04/01/2020  . KNEE ARTHROSCOPY Bilateral   . LAPAROSCOPIC RIGHT HEMI COLECTOMY Right 03/06/2020   Procedure: LAPAROSCOPIC RIGHT HEMI COLECTOMY WITH TAP BLOCK AND LYSIS OF ADHESIONS;  Surgeon: Ileana Roup, MD;  Location: [facility];  Service: General;  Laterality: Right;  . POLYPECTOMY     small adenomatous  . TOTAL HIP ARTHROPLASTY Right 06/05/2020   Procedure: ARTHROPLASTY  ANTERIOR APPROACH. RIGHT HIP;  Surgeon: Rod Can, MD;  Location: [facility];  Service: Orthopedics;  Laterality: Right;  . ULNAR TUNNEL RELEASE      Allergies  Allergen Reactions  . Bystolic [Nebivolol Hcl] Other (See Comments)    "extreme weakness, heaviness in legs & arms, swelling in legs/arms/face, swollen abdomen, pain in bladder, feet pain, soreness in chest"  . Carbamazepine Other (See Comments)    Blood poisoning  Other reaction(s): Unknown  . Cholestyramine Other (See Comments)    "itching rash on stomach, bloated, nausea, vomiting, sleeplessness, extreme pain in arms"  . Hydrazine Yellow [Tartrazine] Other (See Comments)    "does not reduce high blood pressure, pain in arm, high pressure, felt like I was on verge of heart attack, really weak"  . Morphine Other (See Comments)    Feels morbid, weak, still in pain  . Niacin Palpitations    Fast heart beat Other reaction(s): Unknown  . Niaspan [Niacin Er] Palpitations and Other (See Comments)    "fast heart beat, high blood pressure"  . Norvasc [Amlodipine Besylate] Other (See Comments)    "extreme fluid retention/pain)  . Optivar [Azelastine Hcl] Photosensitivity  . Repaglinide Hives  . Sular [Nisoldipine Er] Other (See Comments)    "severe headaches, swelling eyes, hands, feet, shortness of breath, weak, flushed face, brain boiling, fluid retention, high blood sugar, nervous, heart fast beating"  . Telmisartan Other (See Comments)     "headache, difficulty urinating, high blood sugar, fluid retention"  . Amoxicillin-Pot Clavulanate Rash and Other (See Comments)  . Cefdinir Swelling    Vaginal irritation, breathing,   . Ciprofloxacin Hcl Hives  . Clonidine Other (See Comments)    Dry mouth, fluid retention  . Clonidine Hydrochloride Other (See Comments)    Dry mouth, fluid retention  . Codeine Nausea And Vomiting  . Ezetimibe Other (See Comments)    Made weak Other reaction(s): Unknown  . Hydroxychloroquine Other (See Comments)    Low platlets  . Naproxen Other (See Comments)    Shrinks bladder  . Sulfa Antibiotics Rash  . Ziac [Bisoprolol-Hydrochlorothiazide] Other (See Comments)    "stopped urination"  . Amlodipine Besy-Benazepril Hcl     Other reaction(s): cough  . Azelastine Other (See Comments)    Unknown reaction - Per MAR  . Elavil [Amitriptyline] Other (See Comments)    Gave Pt nightmares  . Empagliflozin Other (See Comments)    "Caused yeast infection, slowed my urine"  . Hydralazine Other (See Comments)    Unknown reaction - Per MAR  . Iodine I 131 Tositumomab Other (See Comments)    Unknown reaction - Per MAR  . Kenalog [Triamcinolone] Diarrhea    Per MAR  . Keppra [Levetiracetam] Other (See Comments)    Shaking  . Lamotrigine Itching and Other (See Comments)  . Norvasc [Amlodipine] Other (See Comments)    Unknown reaction - Per MAR  . Other Other (See Comments)    Other reaction(s): Unknown Other reaction(s): Unknown Other reaction(s): Unknown Other reaction(s): Unknown Other reaction(s): Unknown  . Pregabalin Swelling    Other reaction(s): weight gain  . Pseudoephedrine   . Pseudoephedrine-Guaifenesin Er Other (See Comments)    Unknown reaction - Per MAR  . Risedronate     Unknown reaction - Per MAR  . Ru-Hist D [Brompheniramine-Phenylephrine] Other (See Comments)    Unknown reaction - Per MAR  . Sumatriptan Other (See Comments)  . Topiramate Other (See Comments)    Dry  eyes Other reaction(s): Unknown  . Ace Inhibitors Other (See Comments)    unknown  . Actonel [Risedronate Sodium] Other (See Comments)    unknown  . Amlodipine Besylate Other (See Comments)    unknown  . Aspirin Other (See Comments)    unknown  . Atacand [Candesartan] Other (See Comments)    Unknown reaction - MAR  . Bextra [Valdecoxib] Other (See Comments)    Unknown reaction - Per MAR  . Bisoprolol-Hydrochlorothiazide Other (  See Comments)    Unknown reaction - Per MAR  . Candesartan Cilexetil Other (See Comments)    unknown  . Cefadroxil Other (See Comments)    unknown  . Celecoxib Rash  . Hydrocodone Other (See Comments)    unknown  . Hydrocodone-Acetaminophen Other (See Comments)    unknown  . Iodinated Diagnostic Agents Rash    "All over"  Other reaction(s): Unknown  . Meloxicam Other (See Comments)    unknown  . Methylprednisolone Sodium Succinate Other (See Comments)    unknown  . Nabumetone Other (See Comments)    Unknown reaction  . Penicillins Other (See Comments)    unknown  . Pseudoephedrine-Guaifenesin Other (See Comments)    unknown  . Risedronate Sodium Other (See Comments)    unknown  . Rofecoxib Other (See Comments)    Unknown reaction - MAR Other reaction(s): Unknown  . Ru-Tuss [Chlorphen-Pse-Atrop-Hyos-Scop] Other (See Comments)    unknown  . Sulfonamide Derivatives Other (See Comments)    unknown  . Sulphur [Elemental Sulfur] Other (See Comments)    unknown  . Telithromycin Other (See Comments)    unknown  . Terfenadine Other (See Comments)    Unknown reaction - Per MAR  . Trandolapril-Verapamil Hcl Er Other (See Comments)    Headache, difficulty urinating, high blood sugar, fluid retention  Pt is taking Tarka (trandolapril-verapamil) currently, but requests the medication stay in her allergy list  . Trandolapril-Verapamil Hcl Er Other (See Comments)    Headache, difficulty urinating, high blood sugar, fluid retention  Pt is taking  Tarka (trandolapril-verapamil) currently, but requests the medication stay in her allergy list  . Valium [Diazepam] Other (See Comments)    Makes her mean and hyper    Allergies as of 06/11/2020      Reactions   Bystolic [nebivolol Hcl] Other (See Comments)   "extreme weakness, heaviness in legs & arms, swelling in legs/arms/face, swollen abdomen, pain in bladder, feet pain, soreness in chest"   Carbamazepine Other (See Comments)   Blood poisoning Other reaction(s): Unknown   Cholestyramine Other (See Comments)   "itching rash on stomach, bloated, nausea, vomiting, sleeplessness, extreme pain in arms"   Hydrazine Yellow [tartrazine] Other (See Comments)   "does not reduce high blood pressure, pain in arm, high pressure, felt like I was on verge of heart attack, really weak"   Morphine Other (See Comments)   Feels morbid, weak, still in pain   Niacin Palpitations   Fast heart beat Other reaction(s): Unknown   Niaspan [niacin Er] Palpitations, Other (See Comments)   "fast heart beat, high blood pressure"   Norvasc [amlodipine Besylate] Other (See Comments)   "extreme fluid retention/pain)   Optivar [azelastine Hcl] Photosensitivity   Repaglinide Hives   Sular [nisoldipine Er] Other (See Comments)   "severe headaches, swelling eyes, hands, feet, shortness of breath, weak, flushed face, brain boiling, fluid retention, high blood sugar, nervous, heart fast beating"   Telmisartan Other (See Comments)   "headache, difficulty urinating, high blood sugar, fluid retention"   Amoxicillin-pot Clavulanate Rash, Other (See Comments)   Cefdinir Swelling   Vaginal irritation, breathing,    Ciprofloxacin Hcl Hives   Clonidine Other (See Comments)   Dry mouth, fluid retention   Clonidine Hydrochloride Other (See Comments)   Dry mouth, fluid retention   Codeine Nausea And Vomiting   Ezetimibe Other (See Comments)   Made weak Other reaction(s): Unknown   Hydroxychloroquine Other (See Comments)    Low platlets   Naproxen Other (See  Comments)   Shrinks bladder   Sulfa Antibiotics Rash   Ziac [bisoprolol-hydrochlorothiazide] Other (See Comments)   "stopped urination"   Amlodipine Besy-benazepril Hcl    Other reaction(s): cough   Azelastine Other (See Comments)   Unknown reaction - Per MAR   Elavil [amitriptyline] Other (See Comments)   Gave Pt nightmares   Empagliflozin Other (See Comments)   "Caused yeast infection, slowed my urine"   Hydralazine Other (See Comments)   Unknown reaction - Per MAR   Iodine I 131 Tositumomab Other (See Comments)   Unknown reaction - Per MAR   Kenalog [triamcinolone] Diarrhea   Per MAR   Keppra [levetiracetam] Other (See Comments)   Shaking   Lamotrigine Itching, Other (See Comments)   Norvasc [amlodipine] Other (See Comments)   Unknown reaction - Per MAR   Other Other (See Comments)   Other reaction(s): Unknown Other reaction(s): Unknown Other reaction(s): Unknown Other reaction(s): Unknown Other reaction(s): Unknown   Pregabalin Swelling   Other reaction(s): weight gain   Pseudoephedrine    Pseudoephedrine-guaifenesin Er Other (See Comments)   Unknown reaction - Per MAR   Risedronate    Unknown reaction - Per MAR   Ru-hist D [brompheniramine-phenylephrine] Other (See Comments)   Unknown reaction - Per MAR   Sumatriptan Other (See Comments)   Topiramate Other (See Comments)   Dry eyes Other reaction(s): Unknown   Ace Inhibitors Other (See Comments)   unknown   Actonel [risedronate Sodium] Other (See Comments)   unknown   Amlodipine Besylate Other (See Comments)   unknown   Aspirin Other (See Comments)   unknown   Atacand [candesartan] Other (See Comments)   Unknown reaction - MAR   Bextra [valdecoxib] Other (See Comments)   Unknown reaction - Per MAR   Bisoprolol-hydrochlorothiazide Other (See Comments)   Unknown reaction - Per MAR   Candesartan Cilexetil Other (See Comments)   unknown   Cefadroxil Other (See  Comments)   unknown   Celecoxib Rash   Hydrocodone Other (See Comments)   unknown   Hydrocodone-acetaminophen Other (See Comments)   unknown   Iodinated Diagnostic Agents Rash   "All over" Other reaction(s): Unknown   Meloxicam Other (See Comments)   unknown   Methylprednisolone Sodium Succinate Other (See Comments)   unknown   Nabumetone Other (See Comments)   Unknown reaction   Penicillins Other (See Comments)   unknown   Pseudoephedrine-guaifenesin Other (See Comments)   unknown   Risedronate Sodium Other (See Comments)   unknown   Rofecoxib Other (See Comments)   Unknown reaction - MAR Other reaction(s): Unknown   Ru-tuss [chlorphen-pse-atrop-hyos-scop] Other (See Comments)   unknown   Sulfonamide Derivatives Other (See Comments)   unknown   Sulphur [elemental Sulfur] Other (See Comments)   unknown   Telithromycin Other (See Comments)   unknown   Terfenadine Other (See Comments)   Unknown reaction - Per MAR   Trandolapril-verapamil Hcl Er Other (See Comments)   Headache, difficulty urinating, high blood sugar, fluid retention Pt is taking Tarka (trandolapril-verapamil) currently, but requests the medication stay in her allergy list   Trandolapril-verapamil Hcl Er Other (See Comments)   Headache, difficulty urinating, high blood sugar, fluid retention Pt is taking Tarka (trandolapril-verapamil) currently, but requests the medication stay in her allergy list   Valium [diazepam] Other (See Comments)   Makes her mean and hyper      Medication List       Accurate as of June 11, 2020 11:00 AM. If you  have any questions, ask your nurse or doctor.        acetaminophen 500 MG tablet Commonly known as: TYLENOL Take 1 tablet (500 mg total) by mouth 3 (three) times daily for 7 days.   ALPRAZolam 0.25 MG tablet Commonly known as: XANAX Take 1 tablet (0.25 mg total) by mouth at bedtime as needed. TAKE 1 TABLET AT BEDTIME AS NEEDED (CAN TAKE 1 TABLET DURING THE DAY  IF NEEDED FOR ANXIETY)   aspirin 81 MG chewable tablet Commonly known as: Aspirin Childrens Chew 1 tablet (81 mg total) by mouth 2 (two) times daily with a meal.   Biotin 1000 MCG tablet Take 1,000 mcg by mouth daily.   cholestyramine 4 g packet Commonly known as: QUESTRAN Take 4 g by mouth as needed (diarrhea).   cyanocobalamin 1000 MCG/ML injection Commonly known as: (VITAMIN B-12) INJECT 1 ML INTRAMUSCULARLY EVERY 21 DAYS   diclofenac Sodium 1 % Gel Commonly known as: VOLTAREN Apply 2 g topically 4 (four) times daily.   escitalopram 10 MG tablet Commonly known as: LEXAPRO Take 1 tablet (10 mg total) by mouth daily. Start taking on: June 13, 2020   feeding supplement (GLUCERNA SHAKE) Liqd Take 237 mLs by mouth 3 (three) times daily between meals.   furosemide 20 MG tablet Commonly known as: LASIX Take 20 mg by mouth daily.   Glucose Meter Test test strip Generic drug: glucose blood See admin instructions.   PRODIGY VOICE BLOOD GLUCOSE VI   insulin aspart 100 UNIT/ML FlexPen Commonly known as: NOVOLOG Inject 2-12 Units into the skin See admin instructions. Injects 2-10 units of insulin under the skin 3 times a day per sliding scale: CBG 0-150: 2 units; CBG 151-200: 4 units; CBG 201-250: 6 units; CBG 251-300: 8 units; CBG 301-350: 10 units; CBG 351-400: 12 units; CBG >400: 12 units and notify the MD/NP   Insulin Pen Needle 32G X 4 MM Misc Used to inject insulin 3x daily   Lantus SoloStar 100 UNIT/ML Solostar Pen Generic drug: insulin glargine Inject 14 Units into the skin at bedtime.   levothyroxine 75 MCG tablet Commonly known as: SYNTHROID Take 75 mcg by mouth daily before breakfast.   lidocaine 5 % Commonly known as: LIDODERM Place 1 patch onto the skin daily. Remove & Discard patch within 12 hours or as directed by MD   ondansetron 4 MG disintegrating tablet Commonly known as: ZOFRAN-ODT Take 1 tablet (4 mg total) by mouth every 6 (six) hours as  needed for nausea.   oxyCODONE 5 MG immediate release tablet Commonly known as: Oxy IR/ROXICODONE Take 1 tablet (5 mg total) by mouth every 6 (six) hours as needed for up to 7 days for moderate pain or severe pain.   pantoprazole 40 MG tablet Commonly known as: PROTONIX Take 1 tablet (40 mg total) by mouth daily at 6 (six) AM.   phenytoin 100 MG ER capsule Commonly known as: DILANTIN Take 200 mg by mouth at bedtime. What changed: Another medication with the same name was removed. Continue taking this medication, and follow the directions you see here.   phenytoin 50 MG tablet Commonly known as: DILANTIN Chew 50 mg by mouth daily. Take with 182m capsule for a total of 156mevery morning.   phenytoin 50 MG tablet Commonly known as: DILANTIN Chew 100 mg by mouth every morning. Take along with 50 mg to = 150 mg   polyethylene glycol 17 g packet Commonly known as: MIRALAX / GLYCOLAX Take 17 g by  mouth daily as needed.   polyethylene glycol 17 g packet Commonly known as: MIRALAX / GLYCOLAX Take 17 g by mouth daily. For 3 days   primidone 50 MG tablet Commonly known as: MYSOLINE Three tablets in the morning and 2 at dinner time   Prodigy Voice Blood Glucose w/Device Kit Use to check blood sugar 1 time per day.   senna-docusate 8.6-50 MG tablet Commonly known as: Senokot-S Take 1 tablet by mouth 2 (two) times daily.   Systane 0.4-0.3 % Soln Generic drug: Polyethyl Glycol-Propyl Glycol Place 2 drops into both eyes as needed (dry eyes).   trandolapril-verapamil 2-240 MG tablet Commonly known as: TARKA Take 1 tablet by mouth at bedtime.   vitamin B-1 250 MG tablet Take 250 mg by mouth at bedtime.   Vitamin D-3 125 MCG (5000 UT) Tabs Take 5,000 Units by mouth daily.       Review of Systems  Constitutional: Negative for activity change, appetite change and fever.  HENT: Negative for congestion, dental problem, hearing loss and trouble swallowing.   Eyes: Negative  for visual disturbance.  Respiratory: Negative for cough, shortness of breath and wheezing.   Cardiovascular: Negative for chest pain and leg swelling.  Genitourinary: Negative for dysuria, frequency and hematuria.  Musculoskeletal: Positive for arthralgias and myalgias.       Right hip pain  Skin:       Surgical incision  Neurological: Positive for weakness. Negative for dizziness and headaches.  Psychiatric/Behavioral: Negative for confusion and dysphoric mood. The patient is not nervous/anxious.     Immunization History  Administered Date(s) Administered  . Fluad Quad(high Dose 65+) 03/14/2020  . Influenza Split 03/04/2018  . Influenza Whole 05/04/2000, 03/04/2001, 04/03/2009  . Influenza, High Dose Seasonal PF 02/06/2013, 03/14/2020  . Influenza-Unspecified 03/04/2018  . PFIZER(Purple Top)SARS-COV-2 Vaccination 05/25/2019, 06/14/2019  . Pneumococcal Polysaccharide-23 05/04/1997, 01/03/2008, 01/17/2009, 02/01/2009  . Td 05/04/1988, 05/04/1997, 01/17/2009  . Tdap 03/13/2014  . Zoster 11/19/2011   Pertinent  Health Maintenance Due  Topic Date Due  . PNA vac Low Risk Adult (2 of 2 - PCV13) 02/01/2010  . OPHTHALMOLOGY EXAM  07/06/2018  . FOOT EXAM  09/10/2020  . HEMOGLOBIN A1C  12/02/2020  . INFLUENZA VACCINE  Completed  . DEXA SCAN  Completed   Fall Risk  12/04/2019 12/01/2018 09/06/2018 06/15/2018 01/25/2018  Falls in the past year? _0 Yes  Comment - - Last fall in March 2020 [place] in January trying to sit in chair no fall since 12/17/2017  Number falls in past yr: _1 or more  Comment - - - - -  Injury with Fall? _2 Yes  Comment - - - - -  Risk Factor Category  - - High Risk (2 or more Points) High Risk (2 or more Points) High Fall Risk  Risk for fall due to : Impaired balance/gait History of fall(s);Impaired balance/gait;Other (Comment) History of fall(s);Impaired vision;Medication side effect;Impaired balance/gait;Impaired mobility History of fall(s);Impaired  vision;Medication side effect;Impaired balance/gait;Impaired mobility History of fall(s);Impaired balance/gait;Medication side effect;Impaired mobility;Impaired vision  Risk for fall due to: Comment - Knee issues at the time of her falls - - -  Follow up Falls prevention discussed;Education provided Falls prevention discussed Education provided;Falls evaluation completed;Falls prevention discussed Falls evaluation completed;Falls prevention discussed;Education provided Falls prevention discussed;Education provided  Comment - Hx of falls resulting in SNF placement [name] - - -   Functional Status Survey:    Vitals:  06/11/20 1006  BP: 130/63  Pulse: 74  Resp: 20  Temp: (!) 97.5 F (36.4 C)  SpO2: 95%  Weight: 156 lb 12 oz (71.1 kg)  Height: _0  (1.676 m)   Body mass index is 25.3 kg/m. Physical Exam Vitals reviewed.  Constitutional:      General: She is not in acute distress. HENT:     Head: Normocephalic.     Right Ear: There is no impacted cerumen.     Left Ear: There is no impacted cerumen.     Nose: Nose normal.     Mouth/Throat:     Mouth: Mucous membranes are moist.  Eyes:     General:        Right eye: No discharge.        Left eye: No discharge.  Cardiovascular:     Rate and Rhythm: Normal rate and regular rhythm.     Pulses: Normal pulses.     Heart sounds: Normal heart sounds. No murmur heard.   Pulmonary:     Effort: Pulmonary effort is normal. No respiratory distress.     Breath sounds: Normal breath sounds. No wheezing.  Abdominal:     General: Abdomen is flat. Bowel sounds are normal. There is no distension.     Palpations: Abdomen is soft.     Tenderness: There is no abdominal tenderness.  Musculoskeletal:     Cervical back: Normal range of motion.     Right lower leg: No edema.     Left lower leg: No edema.     Comments: Right dorsal flexion intact, strength 5/5  Lymphadenopathy:     Cervical: No cervical adenopathy.  Skin:    General:  Skin is warm and dry.     Capillary Refill: Capillary refill takes less than 2 seconds.     Comments: Right hip aquacell CDI, old drainage marked. Surrounding skin intact.    Neurological:     General: No focal deficit present.     Mental Status: She is alert and oriented to person, place, and time.     Motor: Weakness present.     Gait: Gait abnormal.  Psychiatric:        Mood and Affect: Mood normal.        Behavior: Behavior normal.     Labs reviewed: Recent Labs    03/19/20 1012 03/19/20 1342 03/31/20 0019 04/02/20 0237 06/04/20 1152 06/08/20 0853 06/09/20 0304 06/10/20 0255  NA 129*  --    < > 124*   < > 132* 131* 128*  K 3.8  --    < > 3.6   < > 4.5 4.0 4.0  CL 103  --    < > 94*   < > 105 99 97*  CO2 19*  --    < > 23   < > 21* 22 23  GLUCOSE 256*  --    < > 196*   < > 143* 150* 265*  BUN <5*  --    < > 8   < > _1 CREATININE 0.56  --    < > 0.68   < > 0.55 0.53 0.57  CALCIUM 7.4*  --    < > 7.7*   < > 7.8* 8.2* 8.2*  MG 1.1* 2.1  --  1.3*  --   --   --   --    < > = values in this interval not displayed.   Recent Labs  03/17/20 1220 03/31/20 0019 04/02/20 0237  AST 12* 20 21  ALT _0 ALKPHOS 91 74 72  BILITOT 1.4* 0.7 0.3  PROT 5.0* 5.1* 4.9*  ALBUMIN 2.3* 2.0* 1.9*   Recent Labs    06/04/20 1152 06/04/20 1911 06/06/20 0355 06/06/20 1517 06/07/20 0308 06/08/20 0311 06/10/20 0255  WBC 16.1*   < > 7.8  --  5.6 5.9 5.7  NEUTROABS 14.4*  --  6.0  --   --   --  3.7  HGB 11.9*   < > 7.9*   < > 8.5* 8.8* 9.4*  HCT 36.1   < > 24.7*   < > 25.6* 26.6* 28.2*  MCV 92.1   < > 94.6  --  91.8 93.0 91.9  PLT 249   < > 172  --  135* 170 264   < > = values in this interval not displayed.   Lab Results  Component Value Date   TSH 3.899 06/08/2020   Lab Results  Component Value Date   HGBA1C 6.4 (H) 06/04/2020   Lab Results  Component Value Date   CHOL 148 01/30/2013   HDL 47.10 01/30/2013   LDLCALC 65 01/30/2013   LDLDIRECT 85.7  09/05/2009   TRIG 179.0 (H) 01/30/2013   CHOLHDL 3 01/30/2013    Significant Diagnostic Results in last 30 days:  CT Head Wo Contrast  Result Date: 06/04/2020 CLINICAL DATA:  Unwitnessed fall. EXAM: CT HEAD WITHOUT CONTRAST TECHNIQUE: Contiguous axial images were obtained from the base of the skull through the vertex without intravenous contrast. COMPARISON:  April 21, 2020. FINDINGS: Brain: Stable right frontal encephalomalacia consistent with postoperative change. No mass effect or midline shift is noted. Ventricular size is within normal limits. There is no evidence of mass lesion, hemorrhage or acute infarction. Vascular: No hyperdense vessel or unexpected calcification. Skull: Status post right frontal craniotomy. No acute abnormality is noted. Sinuses/Orbits: No acute finding. Other: None. IMPRESSION: Stable right frontal encephalomalacia consistent with postoperative change. No acute intracranial abnormality seen. Electronically Signed   By: Marijo Conception M.D.   On: 06/04/2020 12:18   Pelvis Portable  Result Date: 06/05/2020 CLINICAL DATA:  Right hip pain. EXAM: PORTABLE PELVIS 1-2 VIEWS COMPARISON:  None. FINDINGS: Generalized osteopenia. Right total hip arthroplasty with postsurgical changes in the surrounding soft tissues. No fracture or dislocation. IMPRESSION: Right total hip arthroplasty. Electronically Signed   By: Kathreen Devoid   On: 06/05/2020 14:51   DG Chest Port 1 View  Result Date: 06/04/2020 CLINICAL DATA:  Fall.  Fractured hip.  Preop. EXAM: PORTABLE CHEST 1 VIEW COMPARISON:  04/01/2020 FINDINGS: Mild right hemidiaphragm elevation. Numerous leads and wires project over the chest. Midline trachea. Normal heart size. No pleural effusion or pneumothorax. Clear lungs. IMPRESSION: No acute cardiopulmonary disease. Electronically Signed   By: Abigail Miyamoto M.D.   On: 06/04/2020 12:59   DG Knee Right Port  Result Date: 06/04/2020 CLINICAL DATA:  Right femoral neck fracture EXAM:  PORTABLE RIGHT KNEE - 1-2 VIEW COMPARISON:  None. FINDINGS: Alignment is anatomic. There is no acute fracture. No joint effusion. Mild degenerative changes are present. IMPRESSION: No acute fracture. Electronically Signed   By: Macy Mis M.D.   On: 06/04/2020 17:05   DG C-Arm 1-60 Min-No Report  Result Date: 06/05/2020 Fluoroscopy was utilized by the requesting physician.  No radiographic interpretation.   DG HIP OPERATIVE UNILAT W OR W/O PELVIS RIGHT  Result Date: 06/05/2020 CLINICAL DATA:  Right anterior  hip. EXAM: OPERATIVE RIGHT HIP (WITH PELVIS IF PERFORMED) 9 VIEWS TECHNIQUE: Fluoroscopic spot image(s) were submitted for interpretation post-operatively. COMPARISON:  June 04, 2020. FINDINGS: Fluoro time: 13 seconds. Nine C-arm fluoroscopic images were obtained intraoperatively and submitted for post operative interpretation. These images demonstrate surgical change relating to total right hip arthroplasty. No unexpected findings. Please see the performing provider's procedural report for further detail. IMPRESSION: Intraoperative fluoroscopic imaging, as detailed above. Electronically Signed   By: Margaretha Sheffield MD   On: 06/05/2020 14:13   DG Hip Unilat With Pelvis 2-3 Views Right  Result Date: 06/04/2020 CLINICAL DATA:  Right hip pain after fall today. EXAM: DG HIP (WITH OR WITHOUT PELVIS) 2-3V RIGHT COMPARISON:  None. FINDINGS: Moderately displaced proximal right femoral neck fracture is noted. IMPRESSION: Moderately displaced proximal right femoral neck fracture. Electronically Signed   By: Marijo Conception M.D.   On: 06/04/2020 12:31    Assessment/Plan 1. Status post total replacement of right hip - surgical incision no signs of infection, still having postsurgical pain, has had BM - f/u with Dr. Lyla Glassing in 2 weeks - cont tylenol 500 mg po tid - cont oxycodone 5 mg po q 6hrs prn - discontinue 02/14- consider tramadol - cont aspirin 81 mg po bid x 30 days for dvt prophylaxis -  continue PT/OT  2. Anxiety - stable with xanax QHS prn  - cont lexapro   3. Acquired hypothyroidism - will need f/u biopsy for large left nodule - cont levothyroxine  4. Uncontrolled type 2 diabetes mellitus with hyperglycemia, with long-term current use of insulin (HCC) - no recent hypoglycemic events - stable with sliding scale and Lantus  5. Postoperative anemia due to acute blood loss - received 1 unit PRBC for hgb 7.3 - heart rate controlled, bp stable - recheck cbc/diff in 1 week  6. B12 deficiency - stable with B12 injections every 21 days  7. History of seizure - no recent seizure - cont phenytoin and primidone - cont seizure precautions  8. Essential (primary) hypertension - bp < 150/90 - cont trandolapril-verapamil - continue to limit sodium in diet < 2000 mg/day - bmp in 1 week   Family/ staff Communication:  Plan discussed with patient and facility nurse  Labs/tests ordered:  Cbc/diff, bmp in 1 week

## 2020-06-11 NOTE — Progress Notes (Signed)
Patient discharged to [facility]. All belongings w/ patient. Verified with facility that they are still okay to receive patient at late hour.

## 2020-06-14 DIAGNOSIS — I10 Essential (primary) hypertension: Secondary | ICD-10-CM | POA: Diagnosis not present

## 2020-06-15 DIAGNOSIS — A049 Bacterial intestinal infection, unspecified: Secondary | ICD-10-CM | POA: Diagnosis not present

## 2020-06-17 DIAGNOSIS — Z20822 Contact with and (suspected) exposure to covid-19: Secondary | ICD-10-CM | POA: Diagnosis not present

## 2020-06-18 ENCOUNTER — Non-Acute Institutional Stay (SKILLED_NURSING_FACILITY): Payer: Medicare Other | Admitting: Internal Medicine

## 2020-06-18 ENCOUNTER — Encounter: Payer: Self-pay | Admitting: Internal Medicine

## 2020-06-18 ENCOUNTER — Ambulatory Visit: Payer: Medicare Other | Admitting: Neurology

## 2020-06-18 DIAGNOSIS — E1165 Type 2 diabetes mellitus with hyperglycemia: Secondary | ICD-10-CM

## 2020-06-18 DIAGNOSIS — S72001A Fracture of unspecified part of neck of right femur, initial encounter for closed fracture: Secondary | ICD-10-CM | POA: Diagnosis not present

## 2020-06-18 DIAGNOSIS — E039 Hypothyroidism, unspecified: Secondary | ICD-10-CM | POA: Diagnosis not present

## 2020-06-18 DIAGNOSIS — Z96641 Presence of right artificial hip joint: Secondary | ICD-10-CM | POA: Diagnosis not present

## 2020-06-18 DIAGNOSIS — D62 Acute posthemorrhagic anemia: Secondary | ICD-10-CM | POA: Diagnosis not present

## 2020-06-18 DIAGNOSIS — K582 Mixed irritable bowel syndrome: Secondary | ICD-10-CM

## 2020-06-18 DIAGNOSIS — S72001D Fracture of unspecified part of neck of right femur, subsequent encounter for closed fracture with routine healing: Secondary | ICD-10-CM | POA: Diagnosis not present

## 2020-06-18 DIAGNOSIS — Z85038 Personal history of other malignant neoplasm of large intestine: Secondary | ICD-10-CM

## 2020-06-18 NOTE — Progress Notes (Signed)
Provider:  Rexene Edison. Mariea Clonts, D.O., C.M.D. Location:  [place] Room Number: 349 Place of Service:  SNF ([id number])  PCP: Bartholome Bill, MD Patient Care Team: Bartholome Bill, MD as PCP - General (Family Medicine)  Extended Emergency Contact Information Primary Emergency Contact: Gerilyn Pilgrim [place] Phone: [phone] Relation: Niece Secondary Emergency Contact: Smith,Ida          [place], [place] [address] [place] Phone: [phone] Relation: Sister  Code Status: FULL Goals of Care: Advanced Directive information Advanced Directives 06/18/2020  Does Patient Have a Medical Advance Directive? No  Type of Advance Directive -  Does patient want to make changes to medical advance directive? No - Patient declined  Copy of [place] in Chart? -  Would patient like information on creating a medical advance directive? -  Pre-existing out of facility DNR order (yellow form or pink MOST form) -   Chief Complaint  Patient presents with  . New Admit To SNF    New Admission To [facility] SNF     HPI: Patient is a 87 y.o. female seen today for admission to [facility] s/[facility] polarization at [place] from February 1 to February 7 with right femoral neck fracture.  Ms. Trulock has a medical history significant for senile osteoporosis, T12 compression fracture, benign brain tumor, seizure disorder, prior subdural hematoma, colon cancer with a history of right hemicolectomy in November 2021, pulmonary embolism, type 2 diabetes, C. difficile colitis, hyperlipidemia, irritable bowel syndrome, and recently postop anemia.  On February 1 she had a mechanical fall at home and complained of severe right hip pain where she was unable to bear weight.  In the emergency department her white blood cell count was 16.1, hemoglobin 11.9, creatinine 0.83 and glucose 135.  The CT of her head showed no  acute intracranial abnormality.  CT of her chest showed no cardiopulmonary disease.  Ultrasound of her thyroid revealed multinodular goiter with left dominant nodule in the mid lobe and a biopsy recommended.  It also showed decreased size of the right nodule from 1.7 to 1 cm.  Hip x-ray revealed moderately displaced proximal right femoral neck fracture for which she underwent right total hip arthroplasty by anterior approach by Dr. Rod Can.  She tolerated the procedure well.  Her pain regimen was adjusted accordingly with opioids every 3 hours and Tylenol scheduled.  She was started on aspirin 81 mg p.o. twice daily as DVT prophylaxis.  She did have postop anemia for which he received 1 unit of packed red blood cells and IV Feraheme.  During her hospitalization her diabetes was well controlled with Lantus 7 units daily and sliding scale insulin.  She was evaluated by physical therapy and SNF was recommended for continued PT OT.  She is to follow-up with Dr. Lyla Glassing in 2 weeks.  When NP saw her she was resting in the [place] but easily aroused and oriented x3.  She is able to follow commands and express her needs.  She did complain of right hip pain at the surgical site at that time that was constant and burning.  She was requiring 2 person assist with transfers and moderate assistance with ADLs.  She is eating fairly well.    I was asked to address concerns regarding her bowels today--she's had colon cancer with right hemicolectomy and has IBS per her record.  She is on senna  S twice a day, MiraLAX daily as needed and Questran 1 packet every 12 hours as needed for diarrhea.  She was requesting to Imodium in the morning and in the evening to stop the diarrhea.  She also apparently requested to have her pain medication even closer together but she did not ask me about that, saying it was working well when received. Her niece was visiting and also added to history--they requested that her senna s be  stopped and the imodium be added bid.    Past Medical History:  Diagnosis Date  . Anemia   . Anxiety   . Asthma   . Breast cancer ([place]) 2014   right breast  . Cancer of right breast ([place]) 12/26/12   right breast 12:00 o'clock, DCIS  . Carotid artery disease ([place])   . Carpal tunnel syndrome, bilateral   . Chronic bronchitis ([place])   . Chronic cough   . Chronic facial pain   . Chronic foot pain   . Colon cancer ([place]) dx'd 11/2019  . Complication of anesthesia    Sore jaw; could not chew or move mouth, prolonged sedation  . Convulsions/seizures ([place]) 10/16/2014  . Diabetes mellitus    type 2 niddm x 20 years  . Dyslipidemia   . Ejection fraction   . Gait abnormality 12/04/2019  . GERD (gastroesophageal reflux disease)   . Hammer toe    bilateral  . History of colonic polyps   . History of meningioma   . HTN (hypertension)   . Hx of radiation therapy 03/07/13- 03/29/13   right breast 4250 cGy 17 sessions  . Hyperlipidemia   . Hypokalemia   . Hyponatremia   . Hypothyroidism   . IBS (irritable bowel syndrome)   . Melanoma ([place])   . Metatarsal bone fracture 2014  . Multiple drug allergies   . Nontoxic thyroid nodule   . Obesity   . Osteoarthritis   . Osteoporosis   . Palpitations   . Personal history of radiation therapy 2014  . Seasonal allergies   . Skin cancer   . Syncope   . Tremor, essential 08/18/2016  . Vitamin B12 deficiency   . Vitamin D deficiency    Past Surgical History:  Procedure Laterality Date  . ABDOMINAL HYSTERECTOMY    . BIOPSY  11/07/2019   Procedure: BIOPSY;  Surgeon: Ronald Lobo, MD;  Location: [facility];  Service: Endoscopy;;  . BRAIN SURGERY    . BREAST BIOPSY Right 01/24/2013   Procedure: RE-EXCICION OF BREAST CANCER, ANTERIOR MARGINS;  Surgeon: Edward Jolly, MD;  Location: [facility];  Service: General;  Laterality: Right;  . BREAST LUMPECTOMY Right 2014  . BREAST LUMPECTOMY WITH NEEDLE LOCALIZATION Right 01/17/2013   Procedure: BREAST  LUMPECTOMY WITH NEEDLE LOCALIZATION;  Surgeon: Edward Jolly, MD;  Location: [place];  Service: General;  Laterality: Right;  . BUNIONECTOMY Bilateral   . CATARACT EXTRACTION W/ INTRAOCULAR LENS IMPLANT Right   . CHOLECYSTECTOMY    . COLONOSCOPY    . COLONOSCOPY WITH PROPOFOL N/A 11/07/2019   Procedure: COLONOSCOPY WITH PROPOFOL;  Surgeon: Ronald Lobo, MD;  [facility]: [facility];  Service: Endoscopy;  Laterality: N/A;  . CRANIOTOMY Right 10/18/2013   Procedure: CRANIOTOMY TUMOR EXCISION;  Surgeon: Floyce Stakes, MD;  Location: [facility];  Service: Neurosurgery;  Laterality: Right;  . ESOPHAGOGASTRODUODENOSCOPY (EGD) WITH PROPOFOL N/A 11/07/2019   Procedure: ESOPHAGOGASTRODUODENOSCOPY (EGD) WITH PROPOFOL;  Surgeon: Ronald Lobo, MD;  [facility];  Service: Endoscopy;  Laterality:  N/A;  . EYE SURGERY    . HERNIA REPAIR    . IR THORACENTESIS ASP PLEURAL SPACE W/IMG GUIDE  04/01/2020  . KNEE ARTHROSCOPY Bilateral   . LAPAROSCOPIC RIGHT HEMI COLECTOMY Right 03/06/2020   Procedure: LAPAROSCOPIC RIGHT HEMI COLECTOMY WITH TAP BLOCK AND LYSIS OF ADHESIONS;  Surgeon: Ileana Roup, MD;  Location: [facility];  Service: General;  Laterality: Right;  . POLYPECTOMY     small adenomatous  . TOTAL HIP ARTHROPLASTY Right 06/05/2020   Procedure: ARTHROPLASTY  ANTERIOR APPROACH. RIGHT HIP;  Surgeon: Rod Can, MD;  Location: [facility];  Service: Orthopedics;  Laterality: Right;  . ULNAR TUNNEL RELEASE      Social History   Socioeconomic History  . Marital status: Widowed    Spouse name: Not on file  . Number of children: 0  . Years of education: college  . Highest education level: Not on file  Occupational History  . Occupation: retired  Tobacco Use  . Smoking status: Never Smoker  . Smokeless tobacco: Never Used  Vaping Use  . Vaping Use: Never used  Substance and Sexual Activity  . Alcohol use: No  . Drug use: No  . Sexual activity: Yes    Comment: menarche age  60, hysterectomy age 24, HRT x 2-3 mos, G1- miscarriage  Other Topics Concern  . Not on file  Social History Narrative   Widowed, lives alone.  Ambulates independently, uses walker at home intermittently    Drinks decaf only   Patient is right handed.   Social Determinants of Health   Financial Resource Strain: Not on file  Food Insecurity: Not on file  Transportation Needs: Not on file  Physical Activity: Not on file  Stress: Not on file  Social Connections: Not on file    reports that she has never smoked. She has never used smokeless tobacco. She reports that she does not drink alcohol and does not use drugs.  Functional Status Survey:    Family History  Problem Relation Age of Onset  . Heart disease Mother   . Osteoporosis Mother   . Diabetes Father   . Pancreatic cancer Father   . Colon cancer Other   . Bone cancer Sister   . Prostate cancer Brother   . Colon cancer Brother   . Rectal cancer Sister   . Thyroid disease Sister        benign goiter resected    Health Maintenance  Topic Date Due  . PNA vac Low Risk Adult (2 of 2 - PCV13) 02/01/2010  . OPHTHALMOLOGY EXAM  07/06/2018  . COVID-19 Vaccine (3 - Pfizer risk 4-dose series) 07/12/2019  . FOOT EXAM  09/10/2020  . HEMOGLOBIN A1C  12/02/2020  . TETANUS/TDAP  03/13/2024  . INFLUENZA VACCINE  Completed  . DEXA SCAN  Completed    Allergies  Allergen Reactions  . Bystolic [Nebivolol Hcl] Other (See Comments)    "extreme weakness, heaviness in legs & arms, swelling in legs/arms/face, swollen abdomen, pain in bladder, feet pain, soreness in chest"  . Carbamazepine Other (See Comments)    Blood poisoning  Other reaction(s): Unknown  . Cholestyramine Other (See Comments)    "itching rash on stomach, bloated, nausea, vomiting, sleeplessness, extreme pain in arms"  . Hydrazine Yellow [Tartrazine] Other (See Comments)    "does not reduce high blood pressure, pain in arm, high pressure, felt like I was on verge  of heart attack, really weak"  . Morphine Other (See Comments)  Feels morbid, weak, still in pain  . Niacin Palpitations    Fast heart beat Other reaction(s): Unknown  . Niaspan [Niacin Er] Palpitations and Other (See Comments)    "fast heart beat, high blood pressure"  . Norvasc [Amlodipine Besylate] Other (See Comments)    "extreme fluid retention/pain)  . Optivar [Azelastine Hcl] Photosensitivity  . Repaglinide Hives  . Sular [Nisoldipine Er] Other (See Comments)    "severe headaches, swelling eyes, hands, feet, shortness of breath, weak, flushed face, brain boiling, fluid retention, high blood sugar, nervous, heart fast beating"  . Telmisartan Other (See Comments)    "headache, difficulty urinating, high blood sugar, fluid retention"  . Amoxicillin-Pot Clavulanate Rash and Other (See Comments)  . Cefdinir Swelling    Vaginal irritation, breathing,   . Ciprofloxacin Hcl Hives  . Clonidine Other (See Comments)    Dry mouth, fluid retention  . Clonidine Hydrochloride Other (See Comments)    Dry mouth, fluid retention  . Codeine Nausea And Vomiting  . Ezetimibe Other (See Comments)    Made weak Other reaction(s): Unknown  . Hydroxychloroquine Other (See Comments)    Low platlets  . Naproxen Other (See Comments)    Shrinks bladder  . Sulfa Antibiotics Rash  . Ziac [Bisoprolol-Hydrochlorothiazide] Other (See Comments)    "stopped urination"  . Amlodipine Besy-Benazepril Hcl     Other reaction(s): cough  . Azelastine Other (See Comments)    Unknown reaction - Per MAR  . Elavil [Amitriptyline] Other (See Comments)    Gave Pt nightmares  . Empagliflozin Other (See Comments)    "Caused yeast infection, slowed my urine"  . Hydralazine Other (See Comments)    Unknown reaction - Per MAR  . Iodine I 131 Tositumomab Other (See Comments)    Unknown reaction - Per MAR  . Kenalog [Triamcinolone] Diarrhea    Per MAR  . Keppra [Levetiracetam] Other (See Comments)    Shaking  .  Lamotrigine Itching and Other (See Comments)  . Norvasc [Amlodipine] Other (See Comments)    Unknown reaction - Per MAR  . Other Other (See Comments)    Other reaction(s): Unknown Other reaction(s): Unknown Other reaction(s): Unknown Other reaction(s): Unknown Other reaction(s): Unknown  . Pregabalin Swelling    Other reaction(s): weight gain  . Pseudoephedrine   . Pseudoephedrine-Guaifenesin Er Other (See Comments)    Unknown reaction - Per MAR  . Risedronate     Unknown reaction - Per MAR  . Ru-Hist D [Brompheniramine-Phenylephrine] Other (See Comments)    Unknown reaction - Per MAR  . Sumatriptan Other (See Comments)  . Topiramate Other (See Comments)    Dry eyes Other reaction(s): Unknown  . Ace Inhibitors Other (See Comments)    unknown  . Actonel [Risedronate Sodium] Other (See Comments)    unknown  . Amlodipine Besylate Other (See Comments)    unknown  . Aspirin Other (See Comments)    unknown  . Atacand [Candesartan] Other (See Comments)    Unknown reaction - MAR  . Bextra [Valdecoxib] Other (See Comments)    Unknown reaction - Per MAR  . Bisoprolol-Hydrochlorothiazide Other (See Comments)    Unknown reaction - Per MAR  . Candesartan Cilexetil Other (See Comments)    unknown  . Cefadroxil Other (See Comments)    unknown  . Celecoxib Rash  . Hydrocodone Other (See Comments)    unknown  . Hydrocodone-Acetaminophen Other (See Comments)    unknown  . Iodinated Diagnostic Agents Rash    "All over"  Other reaction(s): Unknown  . Meloxicam Other (See Comments)    unknown  . Methylprednisolone Sodium Succinate Other (See Comments)    unknown  . Nabumetone Other (See Comments)    Unknown reaction  . Penicillins Other (See Comments)    unknown  . Pseudoephedrine-Guaifenesin Other (See Comments)    unknown  . Risedronate Sodium Other (See Comments)    unknown  . Rofecoxib Other (See Comments)    Unknown reaction - MAR Other reaction(s): Unknown  . Ru-Tuss  [Chlorphen-Pse-Atrop-Hyos-Scop] Other (See Comments)    unknown  . Sulfonamide Derivatives Other (See Comments)    unknown  . Sulphur [Elemental Sulfur] Other (See Comments)    unknown  . Telithromycin Other (See Comments)    unknown  . Terfenadine Other (See Comments)    Unknown reaction - Per MAR  . Trandolapril-Verapamil Hcl Er Other (See Comments)    Headache, difficulty urinating, high blood sugar, fluid retention  Pt is taking Tarka (trandolapril-verapamil) currently, but requests the medication stay in her allergy list  . Trandolapril-Verapamil Hcl Er Other (See Comments)    Headache, difficulty urinating, high blood sugar, fluid retention  Pt is taking Tarka (trandolapril-verapamil) currently, but requests the medication stay in her allergy list  . Valium [Diazepam] Other (See Comments)    Makes her mean and hyper    Outpatient Encounter Medications as of 06/18/2020  Medication Sig  . ALPRAZolam (XANAX) 0.25 MG tablet Take 1 tablet (0.25 mg total) by mouth at bedtime as needed. TAKE 1 TABLET AT BEDTIME AS NEEDED (CAN TAKE 1 TABLET DURING THE DAY IF NEEDED FOR ANXIETY)  . aspirin (ASPIRIN CHILDRENS) 81 MG chewable tablet Chew 1 tablet (81 mg total) by mouth 2 (two) times daily with a meal.  . Biotin 1000 MCG tablet Take 1,000 mcg by mouth daily.  . bisacodyl (DULCOLAX) 10 MG suppository Place 10 mg rectally as needed for moderate constipation.  . Blood Glucose Monitoring Suppl (PRODIGY VOICE BLOOD GLUCOSE) w/Device KIT Use to check blood sugar 1 time per day.  . Cholecalciferol (VITAMIN D-3) 125 MCG (5000 UT) TABS Take 5,000 Units by mouth daily.  . cholestyramine (QUESTRAN) 4 g packet Take 4 g by mouth as needed (diarrhea).  . cyanocobalamin (,VITAMIN B-12,) 1000 MCG/ML injection INJECT 1 ML INTRAMUSCULARLY EVERY 21 DAYS  . diclofenac Sodium (VOLTAREN) 1 % GEL Apply 2 g topically 4 (four) times daily.  Marland Kitchen escitalopram (LEXAPRO) 10 MG tablet Take 1 tablet (10 mg total) by mouth  daily.  . feeding supplement, GLUCERNA SHAKE, (GLUCERNA SHAKE) LIQD Take 237 mLs by mouth 3 (three) times daily between meals.  . furosemide (LASIX) 20 MG tablet Take 20 mg by mouth daily.  Marland Kitchen glucose blood (GLUCOSE METER TEST) test strip See admin instructions.  . Glucose Blood (PRODIGY VOICE BLOOD GLUCOSE VI)   . insulin aspart (NOVOLOG) 100 UNIT/ML FlexPen Inject 2-12 Units into the skin See admin instructions. Injects 2-10 units of insulin under the skin 3 times a day per sliding scale: CBG 0-150: 2 units; CBG 151-200: 4 units; CBG 201-250: 6 units; CBG 251-300: 8 units; CBG 301-350: 10 units; CBG 351-400: 12 units; CBG >400: 12 units and notify the MD/NP  . insulin glargine (LANTUS SOLOSTAR) 100 UNIT/ML Solostar Pen Inject 14 Units into the skin at bedtime.  . Insulin Pen Needle 32G X 4 MM MISC Used to inject insulin 3x daily  . levothyroxine (SYNTHROID) 75 MCG tablet Take 75 mcg by mouth daily before breakfast.  . lidocaine (LIDODERM)  5 % Place 1 patch onto the skin daily. Remove & Discard patch within 12 hours or as directed by MD  . magnesium hydroxide (MILK OF MAGNESIA) 400 MG/5ML suspension Take by mouth daily as needed for mild constipation.  . ondansetron (ZOFRAN-ODT) 4 MG disintegrating tablet Take 1 tablet (4 mg total) by mouth every 6 (six) hours as needed for nausea.  . pantoprazole (PROTONIX) 40 MG tablet Take 1 tablet (40 mg total) by mouth daily at 6 (six) AM.  . phenytoin (DILANTIN) 100 MG ER capsule Take 100 mg by mouth daily.  . phenytoin (DILANTIN) 50 MG tablet Chew 50 mg by mouth daily. Take with 14m capsule for a total of 1563mevery morning.  . Vladimir Fasterlycol-Propyl Glycol (SYSTANE) 0.4-0.3 % SOLN Place 2 drops into both eyes as needed (dry eyes).  . polyethylene glycol (MIRALAX / GLYCOLAX) 17 g packet Take 17 g by mouth daily as needed.  . primidone (MYSOLINE) 50 MG tablet Take 50 mg by mouth 3 (three) times daily. 1 tablet in the morning and 2 tablet  at dinner time.   . senna-docusate (SENOKOT-S) 8.6-50 MG tablet Take 1 tablet by mouth 2 (two) times daily.  . Thiamine HCl (VITAMIN B-1) 250 MG tablet Take 250 mg by mouth at bedtime.  . trandolapril-verapamil (TARKA) 2-240 MG tablet Take 1 tablet by mouth at bedtime.  . [DISCONTINUED] phenytoin (DILANTIN) 50 MG tablet Chew 100 mg by mouth every morning. Take along with 50 mg to = 150 mg  . [DISCONTINUED] polyethylene glycol (MIRALAX / GLYCOLAX) 17 g packet Take 17 g by mouth daily. For 3 days  . [DISCONTINUED] primidone (MYSOLINE) 50 MG tablet Three tablets in the morning and 2 at dinner time (Patient taking differently: 50 mg. 1 tablet in the morning and 2 tablets at dinner time)   No facility-administered encounter medications on file as of 06/18/2020.    Review of Systems  Constitutional: Negative for chills, fever and malaise/fatigue.  HENT: Negative for congestion and sore throat.   Eyes: Negative for blurred vision.  Respiratory: Negative for cough and shortness of breath.   Cardiovascular: Negative for chest pain, palpitations and leg swelling.  Gastrointestinal: Positive for constipation and diarrhea. Negative for abdominal pain, blood in stool, melena, nausea and vomiting.  Genitourinary: Negative for dysuria.  Musculoskeletal: Positive for falls and joint pain. Negative for myalgias.  Skin: Negative for itching and rash.  Neurological: Negative for dizziness and loss of consciousness.  Endo/Heme/Allergies: Bruises/bleeds easily.       Diabetes  Psychiatric/Behavioral: Negative for memory loss.    Vitals:   06/18/20 0952  BP: 124/73  Pulse: 69  Temp: (!) 97.2 F (36.2 C)  Weight: 138 lb 9.6 oz (62.9 kg)  Height: '5\' 6"'  (1.676 m)   Body mass index is 22.37 kg/m. Physical Exam Vitals reviewed.  Constitutional:      General: She is not in acute distress.    Appearance: Normal appearance. She is not toxic-appearing.  HENT:     Head: Normocephalic and atraumatic.     Right Ear:  External ear normal.     Left Ear: External ear normal.     Nose: Nose normal.     Mouth/Throat:     Pharynx: Oropharynx is clear.  Eyes:     Extraocular Movements: Extraocular movements intact.     Conjunctiva/sclera: Conjunctivae normal.     Pupils: Pupils are equal, round, and reactive to light.  Cardiovascular:     Rate and Rhythm: Normal  rate and regular rhythm.  Pulmonary:     Effort: Pulmonary effort is normal.     Breath sounds: Normal breath sounds. No wheezing.  Abdominal:     General: Bowel sounds are normal. There is no distension.     Tenderness: There is no abdominal tenderness. There is no guarding or rebound.  Skin:    Coloration: Skin is pale.     Comments: Staples have been removed, glue holding incision and dressing covering it  Neurological:     General: No focal deficit present.     Mental Status: She is alert and oriented to person, place, and time.     Motor: Weakness present.     Gait: Gait abnormal.  Psychiatric:        Mood and Affect: Mood normal.        Behavior: Behavior normal.     Comments: Very pleasant     Labs reviewed: Basic Metabolic Panel: Recent Labs    03/19/20 1012 03/19/20 1342 03/31/20 0019 04/02/20 0237 06/04/20 1152 06/08/20 0853 06/09/20 0304 06/10/20 0255  NA 129*  --    < > 124*   < > 132* 131* 128*  K 3.8  --    < > 3.6   < > 4.5 4.0 4.0  CL 103  --    < > 94*   < > 105 99 97*  CO2 19*  --    < > 23   < > 21* 22 23  GLUCOSE 256*  --    < > 196*   < > 143* 150* 265*  BUN <5*  --    < > 8   < > '8 10 10  ' CREATININE 0.56  --    < > 0.68   < > 0.55 0.53 0.57  CALCIUM 7.4*  --    < > 7.7*   < > 7.8* 8.2* 8.2*  MG 1.1* 2.1  --  1.3*  --   --   --   --    < > = values in this interval not displayed.   Liver Function Tests: Recent Labs    03/17/20 1220 03/31/20 0019 04/02/20 0237  AST 12* 20 21  ALT '11 9 14  ' ALKPHOS 91 74 72  BILITOT 1.4* 0.7 0.3  PROT 5.0* 5.1* 4.9*  ALBUMIN 2.3* 2.0* 1.9*   Recent Labs     03/12/20 1846  LIPASE 28   No results for input(s): AMMONIA in the last 8760 hours. CBC: Recent Labs    06/04/20 1152 06/04/20 1911 06/06/20 0355 06/06/20 1517 06/07/20 0308 06/08/20 0311 06/10/20 0255  WBC 16.1*   < > 7.8  --  5.6 5.9 5.7  NEUTROABS 14.4*  --  6.0  --   --   --  3.7  HGB 11.9*   < > 7.9*   < > 8.5* 8.8* 9.4*  HCT 36.1   < > 24.7*   < > 25.6* 26.6* 28.2*  MCV 92.1   < > 94.6  --  91.8 93.0 91.9  PLT 249   < > 172  --  135* 170 264   < > = values in this interval not displayed.   Cardiac Enzymes: No results for input(s): CKTOTAL, CKMB, CKMBINDEX, TROPONINI in the last 8760 hours. BNP: Invalid input(s): POCBNP Lab Results  Component Value Date   HGBA1C 6.4 (H) 06/04/2020   Lab Results  Component Value Date   TSH 3.899 06/08/2020   Lab Results  Component Value Date   VITAMINB12 2,750 (H) 06/04/2020   Lab Results  Component Value Date   FOLATE 10.9 09/05/2009   Lab Results  Component Value Date   IRON 14 (L) 03/07/2020   TIBC 321 03/07/2020    Imaging and Procedures obtained prior to SNF admission: CT Head Wo Contrast  Result Date: 06/04/2020 CLINICAL DATA:  Unwitnessed fall. EXAM: CT HEAD WITHOUT CONTRAST TECHNIQUE: Contiguous axial images were obtained from the base of the skull through the vertex without intravenous contrast. COMPARISON:  April 21, 2020. FINDINGS: Brain: Stable right frontal encephalomalacia consistent with postoperative change. No mass effect or midline shift is noted. Ventricular size is within normal limits. There is no evidence of mass lesion, hemorrhage or acute infarction. Vascular: No hyperdense vessel or unexpected calcification. Skull: Status post right frontal craniotomy. No acute abnormality is noted. Sinuses/Orbits: No acute finding. Other: None. IMPRESSION: Stable right frontal encephalomalacia consistent with postoperative change. No acute intracranial abnormality seen. Electronically Signed   By: Marijo Conception  M.D.   On: 06/04/2020 12:18   Pelvis Portable  Result Date: 06/05/2020 CLINICAL DATA:  Right hip pain. EXAM: PORTABLE PELVIS 1-2 VIEWS COMPARISON:  None. FINDINGS: Generalized osteopenia. Right total hip arthroplasty with postsurgical changes in the surrounding soft tissues. No fracture or dislocation. IMPRESSION: Right total hip arthroplasty. Electronically Signed   By: Kathreen Devoid   On: 06/05/2020 14:51   DG Chest Port 1 View  Result Date: 06/04/2020 CLINICAL DATA:  Fall.  Fractured hip.  Preop. EXAM: PORTABLE CHEST 1 VIEW COMPARISON:  04/01/2020 FINDINGS: Mild right hemidiaphragm elevation. Numerous leads and wires project over the chest. Midline trachea. Normal heart size. No pleural effusion or pneumothorax. Clear lungs. IMPRESSION: No acute cardiopulmonary disease. Electronically Signed   By: Abigail Miyamoto M.D.   On: 06/04/2020 12:59   DG Knee Right Port  Result Date: 06/04/2020 CLINICAL DATA:  Right femoral neck fracture EXAM: PORTABLE RIGHT KNEE - 1-2 VIEW COMPARISON:  None. FINDINGS: Alignment is anatomic. There is no acute fracture. No joint effusion. Mild degenerative changes are present. IMPRESSION: No acute fracture. Electronically Signed   By: Macy Mis M.D.   On: 06/04/2020 17:05   DG C-Arm 1-60 Min-No Report  Result Date: 06/05/2020 Fluoroscopy was utilized by the requesting physician.  No radiographic interpretation.   DG HIP OPERATIVE UNILAT W OR W/O PELVIS RIGHT  Result Date: 06/05/2020 CLINICAL DATA:  Right anterior hip. EXAM: OPERATIVE RIGHT HIP (WITH PELVIS IF PERFORMED) 9 VIEWS TECHNIQUE: Fluoroscopic spot image(s) were submitted for interpretation post-operatively. COMPARISON:  June 04, 2020. FINDINGS: Fluoro time: 13 seconds. Nine C-arm fluoroscopic images were obtained intraoperatively and submitted for post operative interpretation. These images demonstrate surgical change relating to total right hip arthroplasty. No unexpected findings. Please see the performing  provider's procedural report for further detail. IMPRESSION: Intraoperative fluoroscopic imaging, as detailed above. Electronically Signed   By: Margaretha Sheffield MD   On: 06/05/2020 14:13   DG Hip Unilat With Pelvis 2-3 Views Right  Result Date: 06/04/2020 CLINICAL DATA:  Right hip pain after fall today. EXAM: DG HIP (WITH OR WITHOUT PELVIS) 2-3V RIGHT COMPARISON:  None. FINDINGS: Moderately displaced proximal right femoral neck fracture is noted. IMPRESSION: Moderately displaced proximal right femoral neck fracture. Electronically Signed   By: Marijo Conception M.D.   On: 06/04/2020 12:31    Assessment/Plan 1. Closed fracture of right hip, initial encounter ([facility]) -s/p total hip anterior approach with Dr. Lyla Glassing -oxycodone orders were renewed by  ortho today and staples removed -cont PT, OT -ASA DVT prophylaxis per ortho   2. Status post total replacement of right hip -as in #1  3. History of colon cancer -s/p hemicolectomy, has long-term challenges with bowels   4. Irritable bowel syndrome with both constipation and diarrhea -will d/c senna s and add bid imodium she took at home -counseled about constipation risks of oxycodone, but she's not had that so far, only diarrhea which has been frustrating  5. Postoperative anemia due to acute blood loss -f/u cbc at one week post d/c  6. Acquired hypothyroidism -cont current levothyroxine -will need f/u US with possible biopsy of thyroid nodule noted in hospital  DMII Lab Results  Component Value Date   HGBA1C 6.4 (H) 06/04/2020  -had been shown as uncontrolled, but not most recently -cont lantus with meal coverage novolog--will need to be transitioned either to scheduled novolog--should not go home with SSI or be here with it long-term  Family/ staff Communication: d/w snf nurse and pt's niece at bedside  Labs/tests ordered:  Cbc, bmp at one week from admit here  Russell. Yaakov Saindon, D.O. [facility] [address], [place] [address] Cell Phone (Mon-Fri 8am-5pm):  [phone] On Call:  [phone] & follow prompts after 5pm & weekends Office Phone:  [phone] Office Fax:  [phone]

## 2020-06-19 ENCOUNTER — Other Ambulatory Visit: Payer: Self-pay | Admitting: Orthopedic Surgery

## 2020-06-19 DIAGNOSIS — I10 Essential (primary) hypertension: Secondary | ICD-10-CM

## 2020-06-19 MED ORDER — TRANDOLAPRIL-VERAPAMIL HCL ER 2-240 MG PO TBCR: 1.0000 | EXTENDED_RELEASE_TABLET | Freq: Every day | ORAL | 0 refills | Status: DC

## 2020-06-25 ENCOUNTER — Ambulatory Visit: Payer: Medicare Other

## 2020-07-02 DIAGNOSIS — R41841 Cognitive communication deficit: Secondary | ICD-10-CM | POA: Diagnosis not present

## 2020-07-02 DIAGNOSIS — R2681 Unsteadiness on feet: Secondary | ICD-10-CM | POA: Diagnosis not present

## 2020-07-02 DIAGNOSIS — S72001D Fracture of unspecified part of neck of right femur, subsequent encounter for closed fracture with routine healing: Secondary | ICD-10-CM | POA: Diagnosis not present

## 2020-07-02 DIAGNOSIS — M6281 Muscle weakness (generalized): Secondary | ICD-10-CM | POA: Diagnosis not present

## 2020-07-03 DIAGNOSIS — R2681 Unsteadiness on feet: Secondary | ICD-10-CM | POA: Diagnosis not present

## 2020-07-03 DIAGNOSIS — S72001D Fracture of unspecified part of neck of right femur, subsequent encounter for closed fracture with routine healing: Secondary | ICD-10-CM | POA: Diagnosis not present

## 2020-07-03 DIAGNOSIS — M6281 Muscle weakness (generalized): Secondary | ICD-10-CM | POA: Diagnosis not present

## 2020-07-03 DIAGNOSIS — R41841 Cognitive communication deficit: Secondary | ICD-10-CM | POA: Diagnosis not present

## 2020-07-04 ENCOUNTER — Other Ambulatory Visit: Payer: Self-pay | Admitting: Orthopedic Surgery

## 2020-07-04 DIAGNOSIS — S72001D Fracture of unspecified part of neck of right femur, subsequent encounter for closed fracture with routine healing: Secondary | ICD-10-CM | POA: Diagnosis not present

## 2020-07-04 DIAGNOSIS — R2681 Unsteadiness on feet: Secondary | ICD-10-CM | POA: Diagnosis not present

## 2020-07-04 DIAGNOSIS — M545 Low back pain, unspecified: Secondary | ICD-10-CM

## 2020-07-04 DIAGNOSIS — G8929 Other chronic pain: Secondary | ICD-10-CM

## 2020-07-04 DIAGNOSIS — R41841 Cognitive communication deficit: Secondary | ICD-10-CM | POA: Diagnosis not present

## 2020-07-04 DIAGNOSIS — M6281 Muscle weakness (generalized): Secondary | ICD-10-CM | POA: Diagnosis not present

## 2020-07-04 MED ORDER — TRAMADOL HCL 50 MG PO TABS: 50.0000 mg | ORAL_TABLET | Freq: Every day | ORAL | 0 refills | Status: AC

## 2020-07-05 DIAGNOSIS — M6281 Muscle weakness (generalized): Secondary | ICD-10-CM | POA: Diagnosis not present

## 2020-07-05 DIAGNOSIS — R41841 Cognitive communication deficit: Secondary | ICD-10-CM | POA: Diagnosis not present

## 2020-07-05 DIAGNOSIS — R2681 Unsteadiness on feet: Secondary | ICD-10-CM | POA: Diagnosis not present

## 2020-07-05 DIAGNOSIS — S72001D Fracture of unspecified part of neck of right femur, subsequent encounter for closed fracture with routine healing: Secondary | ICD-10-CM | POA: Diagnosis not present

## 2020-07-06 DIAGNOSIS — R41841 Cognitive communication deficit: Secondary | ICD-10-CM | POA: Diagnosis not present

## 2020-07-06 DIAGNOSIS — R2681 Unsteadiness on feet: Secondary | ICD-10-CM | POA: Diagnosis not present

## 2020-07-06 DIAGNOSIS — S72001D Fracture of unspecified part of neck of right femur, subsequent encounter for closed fracture with routine healing: Secondary | ICD-10-CM | POA: Diagnosis not present

## 2020-07-06 DIAGNOSIS — M6281 Muscle weakness (generalized): Secondary | ICD-10-CM | POA: Diagnosis not present

## 2020-07-08 ENCOUNTER — Non-Acute Institutional Stay (SKILLED_NURSING_FACILITY): Payer: Medicare Other | Admitting: Orthopedic Surgery

## 2020-07-08 ENCOUNTER — Encounter: Payer: Self-pay | Admitting: Orthopedic Surgery

## 2020-07-08 DIAGNOSIS — Z87898 Personal history of other specified conditions: Secondary | ICD-10-CM

## 2020-07-08 DIAGNOSIS — Z794 Long term (current) use of insulin: Secondary | ICD-10-CM

## 2020-07-08 DIAGNOSIS — K219 Gastro-esophageal reflux disease without esophagitis: Secondary | ICD-10-CM | POA: Diagnosis not present

## 2020-07-08 DIAGNOSIS — M6281 Muscle weakness (generalized): Secondary | ICD-10-CM | POA: Diagnosis not present

## 2020-07-08 DIAGNOSIS — E1165 Type 2 diabetes mellitus with hyperglycemia: Secondary | ICD-10-CM | POA: Diagnosis not present

## 2020-07-08 DIAGNOSIS — R41841 Cognitive communication deficit: Secondary | ICD-10-CM | POA: Diagnosis not present

## 2020-07-08 DIAGNOSIS — R2681 Unsteadiness on feet: Secondary | ICD-10-CM | POA: Diagnosis not present

## 2020-07-08 DIAGNOSIS — K582 Mixed irritable bowel syndrome: Secondary | ICD-10-CM | POA: Diagnosis not present

## 2020-07-08 DIAGNOSIS — G8929 Other chronic pain: Secondary | ICD-10-CM

## 2020-07-08 DIAGNOSIS — E039 Hypothyroidism, unspecified: Secondary | ICD-10-CM

## 2020-07-08 DIAGNOSIS — M545 Low back pain, unspecified: Secondary | ICD-10-CM

## 2020-07-08 DIAGNOSIS — S72001D Fracture of unspecified part of neck of right femur, subsequent encounter for closed fracture with routine healing: Secondary | ICD-10-CM | POA: Diagnosis not present

## 2020-07-08 DIAGNOSIS — I10 Essential (primary) hypertension: Secondary | ICD-10-CM | POA: Diagnosis not present

## 2020-07-08 DIAGNOSIS — Z96641 Presence of right artificial hip joint: Secondary | ICD-10-CM | POA: Diagnosis not present

## 2020-07-08 DIAGNOSIS — F419 Anxiety disorder, unspecified: Secondary | ICD-10-CM | POA: Diagnosis not present

## 2020-07-08 MED ORDER — LIDOCAINE 5 % EX PTCH: 1.0000 | MEDICATED_PATCH | CUTANEOUS | 0 refills | Status: DC

## 2020-07-08 MED ORDER — PHENYTOIN SODIUM EXTENDED 100 MG PO CAPS
100.0000 mg | ORAL_CAPSULE | Freq: Every day | ORAL | 0 refills | Status: DC
Start: 2020-07-08 — End: 2020-08-22

## 2020-07-08 MED ORDER — LEVOTHYROXINE SODIUM 75 MCG PO TABS: 75.0000 ug | ORAL_TABLET | Freq: Every day | ORAL | 0 refills | Status: AC

## 2020-07-08 MED ORDER — PHENYTOIN 50 MG PO CHEW: 50.0000 mg | CHEWABLE_TABLET | Freq: Every day | ORAL | 0 refills | Status: DC

## 2020-07-08 MED ORDER — TRANDOLAPRIL-VERAPAMIL HCL ER 2-240 MG PO TBCR: 1.0000 | EXTENDED_RELEASE_TABLET | Freq: Every day | ORAL | 0 refills | Status: DC

## 2020-07-08 MED ORDER — ESCITALOPRAM OXALATE 10 MG PO TABS: 10.0000 mg | ORAL_TABLET | Freq: Every day | ORAL | 0 refills | Status: DC

## 2020-07-08 MED ORDER — INSULIN ASPART 100 UNIT/ML FLEXPEN: 2.0000 [IU] | PEN_INJECTOR | SUBCUTANEOUS | 0 refills | Status: DC

## 2020-07-08 MED ORDER — FUROSEMIDE 20 MG PO TABS: 20.0000 mg | ORAL_TABLET | Freq: Every day | ORAL | 0 refills | Status: DC

## 2020-07-08 MED ORDER — ASPIRIN 81 MG PO CHEW: 81.0000 mg | CHEWABLE_TABLET | Freq: Two times a day (BID) | ORAL | 0 refills | Status: DC

## 2020-07-08 MED ORDER — PANTOPRAZOLE SODIUM 40 MG PO TBEC: 40.0000 mg | DELAYED_RELEASE_TABLET | Freq: Every day | ORAL | 0 refills | Status: DC

## 2020-07-08 MED ORDER — LANTUS SOLOSTAR 100 UNIT/ML ~~LOC~~ SOPN: 14.0000 [IU] | PEN_INJECTOR | Freq: Every day | SUBCUTANEOUS | 0 refills | Status: DC

## 2020-07-08 MED ORDER — PRIMIDONE 50 MG PO TABS: 50.0000 mg | ORAL_TABLET | Freq: Three times a day (TID) | ORAL | 0 refills | Status: DC

## 2020-07-08 NOTE — Progress Notes (Signed)
Location:    [facility] Room Number: 242/P Place of Service:  SNF [phone])  Provider: Windell Moulding NP  PCP: Bartholome Bill, MD Patient Care Team: Bartholome Bill, MD as PCP - General (Family Medicine)  Extended Emergency Contact Information Primary Emergency Contact: [place] of [place] Phone: [phone] Relation: Niece Secondary Emergency Contact: Smith,Ida          [place],  [address] [place] Phone: [phone] Relation: Sister  Code Status: Full Code Goals of care:  Advanced Directive information Advanced Directives 07/08/2020  Does Patient Have a Medical Advance Directive? No  Type of Advance Directive -  Does patient want to make changes to medical advance directive? No - Patient declined  Copy of [place] in Chart? -  Would patient like information on creating a medical advance directive? -  Pre-existing out of facility DNR order (yellow form or pink MOST form) -     Allergies  Allergen Reactions  . Bystolic [Nebivolol Hcl] Other (See Comments)    "extreme weakness, heaviness in legs & arms, swelling in legs/arms/face, swollen abdomen, pain in bladder, feet pain, soreness in chest"  . Carbamazepine Other (See Comments)    Blood poisoning  Other reaction(s): Unknown  . Cholestyramine Other (See Comments)    "itching rash on stomach, bloated, nausea, vomiting, sleeplessness, extreme pain in arms"  . Hydrazine Yellow [Tartrazine] Other (See Comments)    "does not reduce high blood pressure, pain in arm, high pressure, felt like I was on verge of heart attack, really weak"  . Morphine Other (See Comments)    Feels morbid, weak, still in pain  . Niacin Palpitations    Fast heart beat Other reaction(s): Unknown  . Niaspan [Niacin Er] Palpitations and Other (See Comments)    "fast heart beat, high blood pressure"  . Norvasc [Amlodipine Besylate] Other (See  Comments)    "extreme fluid retention/pain)  . Optivar [Azelastine Hcl] Photosensitivity  . Repaglinide Hives  . Sular [Nisoldipine Er] Other (See Comments)    "severe headaches, swelling eyes, hands, feet, shortness of breath, weak, flushed face, brain boiling, fluid retention, high blood sugar, nervous, heart fast beating"  . Telmisartan Other (See Comments)    "headache, difficulty urinating, high blood sugar, fluid retention"  . Amoxicillin-Pot Clavulanate Rash and Other (See Comments)  . Cefdinir Swelling    Vaginal irritation, breathing,   . Ciprofloxacin Hcl Hives  . Clonidine Other (See Comments)    Dry mouth, fluid retention  . Clonidine Hydrochloride Other (See Comments)    Dry mouth, fluid retention  . Codeine Nausea And Vomiting  . Ezetimibe Other (See Comments)    Made weak Other reaction(s): Unknown  . Hydroxychloroquine Other (See Comments)    Low platlets  . Naproxen Other (See Comments)    Shrinks bladder  . Sulfa Antibiotics Rash  . Ziac [Bisoprolol-Hydrochlorothiazide] Other (See Comments)    "stopped urination"  . Amlodipine Besy-Benazepril Hcl     Other reaction(s): cough  . Azelastine Other (See Comments)    Unknown reaction - Per MAR  . Elavil [Amitriptyline] Other (See Comments)    Gave Pt nightmares  . Empagliflozin Other (See Comments)    "Caused yeast infection, slowed my urine"  . Hydralazine Other (See Comments)    Unknown reaction - Per MAR  . Iodine I 131 Tositumomab Other (See Comments)    Unknown reaction - Per MAR  . Kenalog [  Triamcinolone] Diarrhea    Per MAR  . Keppra [Levetiracetam] Other (See Comments)    Shaking  . Lamotrigine Itching and Other (See Comments)  . Norvasc [Amlodipine] Other (See Comments)    Unknown reaction - Per MAR  . Other Other (See Comments)    Other reaction(s): Unknown Other reaction(s): Unknown Other reaction(s): Unknown Other reaction(s): Unknown Other reaction(s): Unknown  . Pregabalin Swelling     Other reaction(s): weight gain  . Pseudoephedrine   . Pseudoephedrine-Guaifenesin Er Other (See Comments)    Unknown reaction - Per MAR  . Risedronate     Unknown reaction - Per MAR  . Ru-Hist D [Brompheniramine-Phenylephrine] Other (See Comments)    Unknown reaction - Per MAR  . Sumatriptan Other (See Comments)  . Topiramate Other (See Comments)    Dry eyes Other reaction(s): Unknown  . Ace Inhibitors Other (See Comments)    unknown  . Actonel [Risedronate Sodium] Other (See Comments)    unknown  . Amlodipine Besylate Other (See Comments)    unknown  . Aspirin Other (See Comments)    unknown  . Atacand [Candesartan] Other (See Comments)    Unknown reaction - MAR  . Bextra [Valdecoxib] Other (See Comments)    Unknown reaction - Per MAR  . Bisoprolol-Hydrochlorothiazide Other (See Comments)    Unknown reaction - Per MAR  . Candesartan Cilexetil Other (See Comments)    unknown  . Cefadroxil Other (See Comments)    unknown  . Celecoxib Rash  . Hydrocodone Other (See Comments)    unknown  . Hydrocodone-Acetaminophen Other (See Comments)    unknown  . Iodinated Diagnostic Agents Rash    "All over"  Other reaction(s): Unknown  . Meloxicam Other (See Comments)    unknown  . Methylprednisolone Sodium Succinate Other (See Comments)    unknown  . Nabumetone Other (See Comments)    Unknown reaction  . Penicillins Other (See Comments)    unknown  . Pseudoephedrine-Guaifenesin Other (See Comments)    unknown  . Risedronate Sodium Other (See Comments)    unknown  . Rofecoxib Other (See Comments)    Unknown reaction - MAR Other reaction(s): Unknown  . Ru-Tuss [Chlorphen-Pse-Atrop-Hyos-Scop] Other (See Comments)    unknown  . Sulfonamide Derivatives Other (See Comments)    unknown  . Sulphur [Elemental Sulfur] Other (See Comments)    unknown  . Telithromycin Other (See Comments)    unknown  . Terfenadine Other (See Comments)    Unknown reaction - Per MAR  .  Trandolapril-Verapamil Hcl Er Other (See Comments)    Headache, difficulty urinating, high blood sugar, fluid retention  Pt is taking Tarka (trandolapril-verapamil) currently, but requests the medication stay in her allergy list  . Trandolapril-Verapamil Hcl Er Other (See Comments)    Headache, difficulty urinating, high blood sugar, fluid retention  Pt is taking Tarka (trandolapril-verapamil) currently, but requests the medication stay in her allergy list  . Valium [Diazepam] Other (See Comments)    Makes her mean and hyper    Chief Complaint  Patient presents with  . Discharge Note    Discharge Visit     HPI:  87 y.o. female seen today for discharge evaluation.   She is currently a resident of [facility] since [facility]. PMH includes: hypertension, hyperlipidemia, benign meningioma, osteoarthritis, B12 and D deficiency, right sides breast cancer (s/p lumpectomy and radiation), seizure, anxiety, and indulin dependent diabetes.   02/01 she was hospitalized at [facility] 02/01- 02/07 for right femoral neck  fracture. 02/02 she underwent right total hip arthroplasty, anterior approach by Dr. Rod Can. She tolerated procedure well and was discharged to [facility].   Today, she is alert and oriented x 3. Follows commands and can express needs. Ambulating about 200 ft with FWW. Touching assistance with ADLs. Pain minimal wit tylenol and tramadol. Reports of some mild diarrhea this morning. Wished to make senna prn for constipation. Consuming 1-2 meals daily.   No recent falls or injuries.   Blood sugars ranging from 95-230's. No recent hypoglycemic events.    At this time, she plans to discharge home today. Family will provide transportation, caregivers scheduled to help with daily needs. Home health PT/OT ordered. No DME needs. I have advised her to follow up with PCP in 1-2 weeks.      Past Medical History:  Diagnosis Date  . Anemia   .  Anxiety   . Asthma   . Breast cancer ([place]) 2014   right breast  . Cancer of right breast ([place]) 12/26/12   right breast 12:00 o'clock, DCIS  . Carotid artery disease ([place])   . Carpal tunnel syndrome, bilateral   . Chronic bronchitis ([place])   . Chronic cough   . Chronic facial pain   . Chronic foot pain   . Colon cancer ([place]) dx'd 11/2019  . Complication of anesthesia    Sore jaw; could not chew or move mouth, prolonged sedation  . Convulsions/seizures ([place]) 10/16/2014  . Diabetes mellitus    type 2 niddm x 20 years  . Dyslipidemia   . Ejection fraction   . Gait abnormality 12/04/2019  . GERD (gastroesophageal reflux disease)   . Hammer toe    bilateral  . History of colonic polyps   . History of meningioma   . HTN (hypertension)   . Hx of radiation therapy 03/07/13- 03/29/13   right breast 4250 cGy 17 sessions  . Hyperlipidemia   . Hypokalemia   . Hyponatremia   . Hypothyroidism   . IBS (irritable bowel syndrome)   . Melanoma ([place])   . Metatarsal bone fracture 2014  . Multiple drug allergies   . Nontoxic thyroid nodule   . Obesity   . Osteoarthritis   . Osteoporosis   . Palpitations   . Personal history of radiation therapy 2014  . Seasonal allergies   . Skin cancer   . Syncope   . Tremor, essential 08/18/2016  . Vitamin B12 deficiency   . Vitamin D deficiency     Past Surgical History:  Procedure Laterality Date  . ABDOMINAL HYSTERECTOMY    . BIOPSY  11/07/2019   Procedure: BIOPSY;  Surgeon: Ronald Lobo, MD;  Location: [facility];  Service: Endoscopy;;  . BRAIN SURGERY    . BREAST BIOPSY Right 01/24/2013   Procedure: RE-EXCICION OF BREAST CANCER, ANTERIOR MARGINS;  Surgeon: Edward Jolly, MD;  Location: [facility];  Service: General;  Laterality: Right;  . BREAST LUMPECTOMY Right 2014  . BREAST LUMPECTOMY WITH NEEDLE LOCALIZATION Right 01/17/2013   Procedure: BREAST LUMPECTOMY WITH NEEDLE LOCALIZATION;  Surgeon: Edward Jolly, MD;  Location: [facility];   Service: General;  Laterality: Right;  . BUNIONECTOMY Bilateral   . CATARACT EXTRACTION W/ INTRAOCULAR LENS IMPLANT Right   . CHOLECYSTECTOMY    . COLONOSCOPY    . COLONOSCOPY WITH PROPOFOL N/A 11/07/2019   Procedure: COLONOSCOPY WITH PROPOFOL;  Surgeon: Ronald Lobo, MD;  Location: [facility];  Service: Endoscopy;  Laterality: N/A;  . CRANIOTOMY  Right 10/18/2013   Procedure: CRANIOTOMY TUMOR EXCISION;  Surgeon: Floyce Stakes, MD;  Location: [facility];  Service: Neurosurgery;  Laterality: Right;  . ESOPHAGOGASTRODUODENOSCOPY (EGD) WITH PROPOFOL N/A 11/07/2019   Procedure: ESOPHAGOGASTRODUODENOSCOPY (EGD) WITH PROPOFOL;  Surgeon: Ronald Lobo, MD;  Location: [facility];  Service: Endoscopy;  Laterality: N/A;  . EYE SURGERY    . HERNIA REPAIR    . IR THORACENTESIS ASP PLEURAL SPACE W/IMG GUIDE  04/01/2020  . KNEE ARTHROSCOPY Bilateral   . LAPAROSCOPIC RIGHT HEMI COLECTOMY Right 03/06/2020   Procedure: LAPAROSCOPIC RIGHT HEMI COLECTOMY WITH TAP BLOCK AND LYSIS OF ADHESIONS;  Surgeon: Ileana Roup, MD;  Location: [facility];  Service: General;  Laterality: Right;  . POLYPECTOMY     small adenomatous  . TOTAL HIP ARTHROPLASTY Right 06/05/2020   Procedure: ARTHROPLASTY  ANTERIOR APPROACH. RIGHT HIP;  Surgeon: Rod Can, MD;  Location: [facility];  Service: Orthopedics;  Laterality: Right;  . ULNAR TUNNEL RELEASE        reports that she has never smoked. She has never used smokeless tobacco. She reports that she does not drink alcohol and does not use drugs. Social History   Socioeconomic History  . Marital status: Widowed    Spouse name: Not on file  . Number of children: 0  . Years of education: college  . Highest education level: Not on file  Occupational History  . Occupation: retired  Tobacco Use  . Smoking status: Never Smoker  . Smokeless tobacco: Never Used  Vaping Use  . Vaping Use: Never used  Substance and Sexual Activity  . Alcohol use: No  . Drug use: No   . Sexual activity: Yes    Comment: menarche age 62, hysterectomy age 68, HRT x 2-3 mos, G1- miscarriage  Other Topics Concern  . Not on file  Social History Narrative   Widowed, lives alone.  Ambulates independently, uses walker at home intermittently    Drinks decaf only   Patient is right handed.   Social Determinants of Health   Financial Resource Strain: Not on file  Food Insecurity: Not on file  Transportation Needs: Not on file  Physical Activity: Not on file  Stress: Not on file  Social Connections: Not on file  Intimate Partner Violence: Not on file   Functional Status Survey:    Allergies  Allergen Reactions  . Bystolic [Nebivolol Hcl] Other (See Comments)    "extreme weakness, heaviness in legs & arms, swelling in legs/arms/face, swollen abdomen, pain in bladder, feet pain, soreness in chest"  . Carbamazepine Other (See Comments)    Blood poisoning  Other reaction(s): Unknown  . Cholestyramine Other (See Comments)    "itching rash on stomach, bloated, nausea, vomiting, sleeplessness, extreme pain in arms"  . Hydrazine Yellow [Tartrazine] Other (See Comments)    "does not reduce high blood pressure, pain in arm, high pressure, felt like I was on verge of heart attack, really weak"  . Morphine Other (See Comments)    Feels morbid, weak, still in pain  . Niacin Palpitations    Fast heart beat Other reaction(s): Unknown  . Niaspan [Niacin Er] Palpitations and Other (See Comments)    "fast heart beat, high blood pressure"  . Norvasc [Amlodipine Besylate] Other (See Comments)    "extreme fluid retention/pain)  . Optivar [Azelastine Hcl] Photosensitivity  . Repaglinide Hives  . Sular [Nisoldipine Er] Other (See Comments)    "severe headaches, swelling eyes, hands, feet, shortness of breath, weak, flushed face,  brain boiling, fluid retention, high blood sugar, nervous, heart fast beating"  . Telmisartan Other (See Comments)    "headache, difficulty urinating, high  blood sugar, fluid retention"  . Amoxicillin-Pot Clavulanate Rash and Other (See Comments)  . Cefdinir Swelling    Vaginal irritation, breathing,   . Ciprofloxacin Hcl Hives  . Clonidine Other (See Comments)    Dry mouth, fluid retention  . Clonidine Hydrochloride Other (See Comments)    Dry mouth, fluid retention  . Codeine Nausea And Vomiting  . Ezetimibe Other (See Comments)    Made weak Other reaction(s): Unknown  . Hydroxychloroquine Other (See Comments)    Low platlets  . Naproxen Other (See Comments)    Shrinks bladder  . Sulfa Antibiotics Rash  . Ziac [Bisoprolol-Hydrochlorothiazide] Other (See Comments)    "stopped urination"  . Amlodipine Besy-Benazepril Hcl     Other reaction(s): cough  . Azelastine Other (See Comments)    Unknown reaction - Per MAR  . Elavil [Amitriptyline] Other (See Comments)    Gave Pt nightmares  . Empagliflozin Other (See Comments)    "Caused yeast infection, slowed my urine"  . Hydralazine Other (See Comments)    Unknown reaction - Per MAR  . Iodine I 131 Tositumomab Other (See Comments)    Unknown reaction - Per MAR  . Kenalog [Triamcinolone] Diarrhea    Per MAR  . Keppra [Levetiracetam] Other (See Comments)    Shaking  . Lamotrigine Itching and Other (See Comments)  . Norvasc [Amlodipine] Other (See Comments)    Unknown reaction - Per MAR  . Other Other (See Comments)    Other reaction(s): Unknown Other reaction(s): Unknown Other reaction(s): Unknown Other reaction(s): Unknown Other reaction(s): Unknown  . Pregabalin Swelling    Other reaction(s): weight gain  . Pseudoephedrine   . Pseudoephedrine-Guaifenesin Er Other (See Comments)    Unknown reaction - Per MAR  . Risedronate     Unknown reaction - Per MAR  . Ru-Hist D [Brompheniramine-Phenylephrine] Other (See Comments)    Unknown reaction - Per MAR  . Sumatriptan Other (See Comments)  . Topiramate Other (See Comments)    Dry eyes Other reaction(s): Unknown  . Ace  Inhibitors Other (See Comments)    unknown  . Actonel [Risedronate Sodium] Other (See Comments)    unknown  . Amlodipine Besylate Other (See Comments)    unknown  . Aspirin Other (See Comments)    unknown  . Atacand [Candesartan] Other (See Comments)    Unknown reaction - MAR  . Bextra [Valdecoxib] Other (See Comments)    Unknown reaction - Per MAR  . Bisoprolol-Hydrochlorothiazide Other (See Comments)    Unknown reaction - Per MAR  . Candesartan Cilexetil Other (See Comments)    unknown  . Cefadroxil Other (See Comments)    unknown  . Celecoxib Rash  . Hydrocodone Other (See Comments)    unknown  . Hydrocodone-Acetaminophen Other (See Comments)    unknown  . Iodinated Diagnostic Agents Rash    "All over"  Other reaction(s): Unknown  . Meloxicam Other (See Comments)    unknown  . Methylprednisolone Sodium Succinate Other (See Comments)    unknown  . Nabumetone Other (See Comments)    Unknown reaction  . Penicillins Other (See Comments)    unknown  . Pseudoephedrine-Guaifenesin Other (See Comments)    unknown  . Risedronate Sodium Other (See Comments)    unknown  . Rofecoxib Other (See Comments)    Unknown reaction - MAR Other reaction(s): Unknown  .  Ru-Tuss [Chlorphen-Pse-Atrop-Hyos-Scop] Other (See Comments)    unknown  . Sulfonamide Derivatives Other (See Comments)    unknown  . Sulphur [Elemental Sulfur] Other (See Comments)    unknown  . Telithromycin Other (See Comments)    unknown  . Terfenadine Other (See Comments)    Unknown reaction - Per MAR  . Trandolapril-Verapamil Hcl Er Other (See Comments)    Headache, difficulty urinating, high blood sugar, fluid retention  Pt is taking Tarka (trandolapril-verapamil) currently, but requests the medication stay in her allergy list  . Trandolapril-Verapamil Hcl Er Other (See Comments)    Headache, difficulty urinating, high blood sugar, fluid retention  Pt is taking Tarka (trandolapril-verapamil) currently, but  requests the medication stay in her allergy list  . Valium [Diazepam] Other (See Comments)    Makes her mean and hyper    Pertinent  Health Maintenance Due  Topic Date Due  . PNA vac Low Risk Adult (2 of 2 - PCV13) 02/01/2010  . OPHTHALMOLOGY EXAM  07/06/2018  . FOOT EXAM  09/10/2020  . HEMOGLOBIN A1C  12/02/2020  . INFLUENZA VACCINE  Completed  . DEXA SCAN  Completed    Medications: Allergies as of 07/08/2020      Reactions   Bystolic [nebivolol Hcl] Other (See Comments)   "extreme weakness, heaviness in legs & arms, swelling in legs/arms/face, swollen abdomen, pain in bladder, feet pain, soreness in chest"   Carbamazepine Other (See Comments)   Blood poisoning Other reaction(s): Unknown   Cholestyramine Other (See Comments)   "itching rash on stomach, bloated, nausea, vomiting, sleeplessness, extreme pain in arms"   Hydrazine Yellow [tartrazine] Other (See Comments)   "does not reduce high blood pressure, pain in arm, high pressure, felt like I was on verge of heart attack, really weak"   Morphine Other (See Comments)   Feels morbid, weak, still in pain   Niacin Palpitations   Fast heart beat Other reaction(s): Unknown   Niaspan [niacin Er] Palpitations, Other (See Comments)   "fast heart beat, high blood pressure"   Norvasc [amlodipine Besylate] Other (See Comments)   "extreme fluid retention/pain)   Optivar [azelastine Hcl] Photosensitivity   Repaglinide Hives   Sular [nisoldipine Er] Other (See Comments)   "severe headaches, swelling eyes, hands, feet, shortness of breath, weak, flushed face, brain boiling, fluid retention, high blood sugar, nervous, heart fast beating"   Telmisartan Other (See Comments)   "headache, difficulty urinating, high blood sugar, fluid retention"   Amoxicillin-pot Clavulanate Rash, Other (See Comments)   Cefdinir Swelling   Vaginal irritation, breathing,    Ciprofloxacin Hcl Hives   Clonidine Other (See Comments)   Dry mouth, fluid  retention   Clonidine Hydrochloride Other (See Comments)   Dry mouth, fluid retention   Codeine Nausea And Vomiting   Ezetimibe Other (See Comments)   Made weak Other reaction(s): Unknown   Hydroxychloroquine Other (See Comments)   Low platlets   Naproxen Other (See Comments)   Shrinks bladder   Sulfa Antibiotics Rash   Ziac [bisoprolol-hydrochlorothiazide] Other (See Comments)   "stopped urination"   Amlodipine Besy-benazepril Hcl    Other reaction(s): cough   Azelastine Other (See Comments)   Unknown reaction - Per MAR   Elavil [amitriptyline] Other (See Comments)   Gave Pt nightmares   Empagliflozin Other (See Comments)   "Caused yeast infection, slowed my urine"   Hydralazine Other (See Comments)   Unknown reaction - Per MAR   Iodine I 131 Tositumomab Other (See Comments)   Unknown  reaction - Per MAR   Kenalog [triamcinolone] Diarrhea   Per MAR   Keppra [levetiracetam] Other (See Comments)   Shaking   Lamotrigine Itching, Other (See Comments)   Norvasc [amlodipine] Other (See Comments)   Unknown reaction - Per MAR   Other Other (See Comments)   Other reaction(s): Unknown Other reaction(s): Unknown Other reaction(s): Unknown Other reaction(s): Unknown Other reaction(s): Unknown   Pregabalin Swelling   Other reaction(s): weight gain   Pseudoephedrine    Pseudoephedrine-guaifenesin Er Other (See Comments)   Unknown reaction - Per MAR   Risedronate    Unknown reaction - Per MAR   Ru-hist D [brompheniramine-phenylephrine] Other (See Comments)   Unknown reaction - Per MAR   Sumatriptan Other (See Comments)   Topiramate Other (See Comments)   Dry eyes Other reaction(s): Unknown   Ace Inhibitors Other (See Comments)   unknown   Actonel [risedronate Sodium] Other (See Comments)   unknown   Amlodipine Besylate Other (See Comments)   unknown   Aspirin Other (See Comments)   unknown   Atacand [candesartan] Other (See Comments)   Unknown reaction - MAR   Bextra  [valdecoxib] Other (See Comments)   Unknown reaction - Per MAR   Bisoprolol-hydrochlorothiazide Other (See Comments)   Unknown reaction - Per MAR   Candesartan Cilexetil Other (See Comments)   unknown   Cefadroxil Other (See Comments)   unknown   Celecoxib Rash   Hydrocodone Other (See Comments)   unknown   Hydrocodone-acetaminophen Other (See Comments)   unknown   Iodinated Diagnostic Agents Rash   "All over" Other reaction(s): Unknown   Meloxicam Other (See Comments)   unknown   Methylprednisolone Sodium Succinate Other (See Comments)   unknown   Nabumetone Other (See Comments)   Unknown reaction   Penicillins Other (See Comments)   unknown   Pseudoephedrine-guaifenesin Other (See Comments)   unknown   Risedronate Sodium Other (See Comments)   unknown   Rofecoxib Other (See Comments)   Unknown reaction - MAR Other reaction(s): Unknown   Ru-tuss [chlorphen-pse-atrop-hyos-scop] Other (See Comments)   unknown   Sulfonamide Derivatives Other (See Comments)   unknown   Sulphur [elemental Sulfur] Other (See Comments)   unknown   Telithromycin Other (See Comments)   unknown   Terfenadine Other (See Comments)   Unknown reaction - Per MAR   Trandolapril-verapamil Hcl Er Other (See Comments)   Headache, difficulty urinating, high blood sugar, fluid retention Pt is taking Tarka (trandolapril-verapamil) currently, but requests the medication stay in her allergy list   Trandolapril-verapamil Hcl Er Other (See Comments)   Headache, difficulty urinating, high blood sugar, fluid retention Pt is taking Tarka (trandolapril-verapamil) currently, but requests the medication stay in her allergy list   Valium [diazepam] Other (See Comments)   Makes her mean and hyper      Medication List       Accurate as of July 08, 2020 12:05 PM. If you have any questions, ask your nurse or doctor.        ALPRAZolam 0.25 MG tablet Commonly known as: XANAX Take 1 tablet (0.25 mg total) by  mouth at bedtime as needed. TAKE 1 TABLET AT BEDTIME AS NEEDED (CAN TAKE 1 TABLET DURING THE DAY IF NEEDED FOR ANXIETY)   aspirin 81 MG chewable tablet Commonly known as: Aspirin Childrens Chew 1 tablet (81 mg total) by mouth 2 (two) times daily with a meal.   Biotin 1000 MCG tablet Take 1,000 mcg by mouth daily.   bisacodyl  10 MG suppository Commonly known as: DULCOLAX Place 10 mg rectally as needed for moderate constipation.   cholestyramine 4 g packet Commonly known as: QUESTRAN Take 4 g by mouth as needed (diarrhea).   cyanocobalamin 1000 MCG/ML injection Commonly known as: (VITAMIN B-12) INJECT 1 ML INTRAMUSCULARLY EVERY 21 DAYS   diclofenac Sodium 1 % Gel Commonly known as: VOLTAREN Apply 2 g topically 4 (four) times daily.   escitalopram 10 MG tablet Commonly known as: LEXAPRO Take 1 tablet (10 mg total) by mouth daily.   feeding supplement (GLUCERNA SHAKE) Liqd Take 237 mLs by mouth 3 (three) times daily between meals.   furosemide 20 MG tablet Commonly known as: LASIX Take 20 mg by mouth daily.   Glucose Meter Test test strip Generic drug: glucose blood See admin instructions.   PRODIGY VOICE BLOOD GLUCOSE VI   insulin aspart 100 UNIT/ML FlexPen Commonly known as: NOVOLOG Inject 2-12 Units into the skin See admin instructions. Injects 2-10 units of insulin under the skin 3 times a day per sliding scale: CBG 0-150: 2 units; CBG 151-200: 4 units; CBG 201-250: 6 units; CBG 251-300: 8 units; CBG 301-350: 10 units; CBG 351-400: 12 units; CBG >400: 12 units and notify the MD/NP   Insulin Pen Needle 32G X 4 MM Misc Used to inject insulin 3x daily   Lantus SoloStar 100 UNIT/ML Solostar Pen Generic drug: insulin glargine Inject 14 Units into the skin at bedtime.   levothyroxine 75 MCG tablet Commonly known as: SYNTHROID Take 75 mcg by mouth daily before breakfast.   lidocaine 5 % Commonly known as: LIDODERM Place 1 patch onto the skin daily. Remove & Discard  patch within 12 hours or as directed by MD   magnesium hydroxide 400 MG/5ML suspension Commonly known as: MILK OF MAGNESIA Take by mouth daily as needed for mild constipation.   ondansetron 4 MG disintegrating tablet Commonly known as: ZOFRAN-ODT Take 1 tablet (4 mg total) by mouth every 6 (six) hours as needed for nausea.   pantoprazole 40 MG tablet Commonly known as: PROTONIX Take 1 tablet (40 mg total) by mouth daily at 6 (six) AM.   phenytoin 100 MG ER capsule Commonly known as: DILANTIN Take 100 mg by mouth daily.   phenytoin 50 MG tablet Commonly known as: DILANTIN Chew 50 mg by mouth daily. Take with 156m capsule for a total of 1556mevery morning.   polyethylene glycol 17 g packet Commonly known as: MIRALAX / GLYCOLAX Take 17 g by mouth daily as needed.   primidone 50 MG tablet Commonly known as: MYSOLINE Take 50 mg by mouth 3 (three) times daily. 1 tablet in the morning and 2 tablet  at dinner time.   Prodigy Voice Blood Glucose w/Device Kit Use to check blood sugar 1 time per day.   senna-docusate 8.6-50 MG tablet Commonly known as: Senokot-S Take 1 tablet by mouth 2 (two) times daily.   Systane 0.4-0.3 % Soln Generic drug: Polyethyl Glycol-Propyl Glycol Place 2 drops into both eyes as needed (dry eyes).   traMADol 50 MG tablet Commonly known as: ULTRAM Take 1 tablet (50 mg total) by mouth daily with breakfast for 14 days.   trandolapril-verapamil 2-240 MG tablet Commonly known as: TARKA Take 1 tablet by mouth at bedtime.   vitamin B-1 250 MG tablet Take 250 mg by mouth at bedtime.   Vitamin D-3 125 MCG (5000 UT) Tabs Take 5,000 Units by mouth daily.       Review of Systems  Constitutional: Negative for activity change, appetite change and fever.  HENT: Negative for congestion, hearing loss and trouble swallowing.   Eyes: Negative for visual disturbance.  Respiratory: Negative for cough, shortness of breath and wheezing.   Cardiovascular:  Negative for chest pain and leg swelling.  Gastrointestinal: Positive for diarrhea. Negative for abdominal distention, abdominal pain, constipation and nausea.  Genitourinary: Positive for frequency. Negative for dysuria and hematuria.  Musculoskeletal: Positive for arthralgias, back pain and myalgias.       Right hip soreness  Skin:       Dry skin  Neurological: Positive for weakness. Negative for dizziness and headaches.  Hematological: Bruises/bleeds easily.  Psychiatric/Behavioral: Negative for confusion and dysphoric mood. The patient is not nervous/anxious.     Vitals:   07/08/20 1204  BP: 122/79  Pulse: 78  Resp: 18  Temp: (!) 97.2 F (36.2 C)  SpO2: 95%  Weight: 142 lb 3.2 oz (64.5 kg)  Height: '5\' 6"'  (1.676 m)   Body mass index is 22.95 kg/m. Physical Exam Vitals reviewed.  Constitutional:      General: She is not in acute distress. HENT:     Head: Normocephalic.     Right Ear: There is no impacted cerumen.     Left Ear: There is no impacted cerumen.     Nose: Nose normal.     Mouth/Throat:     Mouth: Mucous membranes are moist.  Eyes:     General:        Right eye: No discharge.        Left eye: No discharge.  Cardiovascular:     Rate and Rhythm: Normal rate and regular rhythm.     Pulses: Normal pulses.     Heart sounds: Normal heart sounds. No murmur heard.   Pulmonary:     Effort: Pulmonary effort is normal. No respiratory distress.     Breath sounds: Normal breath sounds. No wheezing.  Abdominal:     General: Bowel sounds are normal. There is no distension.     Palpations: Abdomen is soft.     Tenderness: There is no abdominal tenderness.  Musculoskeletal:     Cervical back: Normal range of motion.     Right lower leg: No edema.     Left lower leg: No edema.  Lymphadenopathy:     Cervical: No cervical adenopathy.  Skin:    General: Skin is warm and dry.     Capillary Refill: Capillary refill takes less than 2 seconds.     Comments: Surgical  incision closed. No drainage or sign of infection.   Neurological:     General: No focal deficit present.     Mental Status: She is alert and oriented to person, place, and time.     Motor: Weakness present.     Gait: Gait abnormal.     Comments: Wheelchair/walker  Psychiatric:        Mood and Affect: Mood normal.        Behavior: Behavior normal.     Labs reviewed: Basic Metabolic Panel: Recent Labs    03/19/20 1012 03/19/20 1342 03/31/20 0019 04/02/20 0237 06/04/20 1152 06/08/20 0853 06/09/20 0304 06/10/20 0255  NA 129*  --    < > 124*   < > 132* 131* 128*  K 3.8  --    < > 3.6   < > 4.5 4.0 4.0  CL 103  --    < > 94*   < > 105 99 97*  CO2 19*  --    < > 23   < > 21* 22 23  GLUCOSE 256*  --    < > 196*   < > 143* 150* 265*  BUN <5*  --    < > 8   < > '8 10 10  ' CREATININE 0.56  --    < > 0.68   < > 0.55 0.53 0.57  CALCIUM 7.4*  --    < > 7.7*   < > 7.8* 8.2* 8.2*  MG 1.1* 2.1  --  1.3*  --   --   --   --    < > = values in this interval not displayed.   Liver Function Tests: Recent Labs    03/17/20 1220 03/31/20 0019 04/02/20 0237  AST 12* 20 21  ALT '11 9 14  ' ALKPHOS 91 74 72  BILITOT 1.4* 0.7 0.3  PROT 5.0* 5.1* 4.9*  ALBUMIN 2.3* 2.0* 1.9*   Recent Labs    03/12/20 1846  LIPASE 28   No results for input(s): AMMONIA in the last 8760 hours. CBC: Recent Labs    06/04/20 1152 06/04/20 1911 06/06/20 0355 06/06/20 1517 06/07/20 0308 06/08/20 0311 06/10/20 0255  WBC 16.1*   < > 7.8  --  5.6 5.9 5.7  NEUTROABS 14.4*  --  6.0  --   --   --  3.7  HGB 11.9*   < > 7.9*   < > 8.5* 8.8* 9.4*  HCT 36.1   < > 24.7*   < > 25.6* 26.6* 28.2*  MCV 92.1   < > 94.6  --  91.8 93.0 91.9  PLT 249   < > 172  --  135* 170 264   < > = values in this interval not displayed.   Cardiac Enzymes: No results for input(s): CKTOTAL, CKMB, CKMBINDEX, TROPONINI in the last 8760 hours. BNP: Invalid input(s): POCBNP CBG: Recent Labs    06/10/20 1105 06/10/20 1708  06/10/20 2106  GLUCAP 344* 242* 164*    Procedures and Imaging Studies During Stay: No results found.  Assessment/Plan:   1. Status post total replacement of right hip - stable, no sign of infection or dvt, WBAT - home health PT/OT - aspirin 81 mg po bid complete 03/20  2. Essential hypertension - bp at goal < 150/90 with trandolapril-verapamil - cbc/diff- future - bmp- future  3. Chronic midline low back pain without sciatica - stable with tramadol  4. Irritable bowel syndrome with both constipation and diarrhea - reports diarrhea today - Guiles make Senakot prn  5. Acquired hypothyroidism - stable with levothyroxine  6. Uncontrolled type 2 diabetes mellitus with hyperglycemia, with long-term current use of insulin (HCC) - sugars stable with novolog and lantus - no reports of hypoglycemia  7. Anxiety - stable with lexapro and prn xanax    Patient is being discharged with the following home health services: PT/OT   Patient is being discharged with the following durable medical equipment: None  Patient has been advised to f/u with their PCP in 1-2 weeks to bring them up to date on their rehab stay.  Social services at facility was responsible for arranging this appointment.  Pt was provided with a 30 day supply of prescriptions for medications and refills must be obtained from their PCP.  For controlled substances, a more limited supply Gato be provided adequate until PCP appointment only.  Future labs/tests needed:  Cbc/diff, bmp

## 2020-07-11 DIAGNOSIS — S72009A Fracture of unspecified part of neck of unspecified femur, initial encounter for closed fracture: Secondary | ICD-10-CM | POA: Insufficient documentation

## 2020-07-12 DIAGNOSIS — E785 Hyperlipidemia, unspecified: Secondary | ICD-10-CM | POA: Diagnosis not present

## 2020-07-12 DIAGNOSIS — G8929 Other chronic pain: Secondary | ICD-10-CM | POA: Diagnosis not present

## 2020-07-12 DIAGNOSIS — I700 Atherosclerosis of aorta: Secondary | ICD-10-CM | POA: Diagnosis not present

## 2020-07-12 DIAGNOSIS — C189 Malignant neoplasm of colon, unspecified: Secondary | ICD-10-CM | POA: Diagnosis not present

## 2020-07-12 DIAGNOSIS — M545 Low back pain, unspecified: Secondary | ICD-10-CM | POA: Diagnosis not present

## 2020-07-12 DIAGNOSIS — G250 Essential tremor: Secondary | ICD-10-CM | POA: Diagnosis not present

## 2020-07-12 DIAGNOSIS — K219 Gastro-esophageal reflux disease without esophagitis: Secondary | ICD-10-CM | POA: Diagnosis not present

## 2020-07-12 DIAGNOSIS — E039 Hypothyroidism, unspecified: Secondary | ICD-10-CM | POA: Diagnosis not present

## 2020-07-12 DIAGNOSIS — D630 Anemia in neoplastic disease: Secondary | ICD-10-CM | POA: Diagnosis not present

## 2020-07-12 DIAGNOSIS — I10 Essential (primary) hypertension: Secondary | ICD-10-CM | POA: Diagnosis not present

## 2020-07-12 DIAGNOSIS — K582 Mixed irritable bowel syndrome: Secondary | ICD-10-CM | POA: Diagnosis not present

## 2020-07-12 DIAGNOSIS — J449 Chronic obstructive pulmonary disease, unspecified: Secondary | ICD-10-CM | POA: Diagnosis not present

## 2020-07-12 DIAGNOSIS — I6529 Occlusion and stenosis of unspecified carotid artery: Secondary | ICD-10-CM | POA: Diagnosis not present

## 2020-07-12 DIAGNOSIS — M1990 Unspecified osteoarthritis, unspecified site: Secondary | ICD-10-CM | POA: Diagnosis not present

## 2020-07-12 DIAGNOSIS — E119 Type 2 diabetes mellitus without complications: Secondary | ICD-10-CM | POA: Diagnosis not present

## 2020-07-12 DIAGNOSIS — E042 Nontoxic multinodular goiter: Secondary | ICD-10-CM | POA: Diagnosis not present

## 2020-07-12 DIAGNOSIS — F419 Anxiety disorder, unspecified: Secondary | ICD-10-CM | POA: Diagnosis not present

## 2020-07-12 DIAGNOSIS — Z96641 Presence of right artificial hip joint: Secondary | ICD-10-CM | POA: Diagnosis not present

## 2020-07-12 DIAGNOSIS — E538 Deficiency of other specified B group vitamins: Secondary | ICD-10-CM | POA: Diagnosis not present

## 2020-07-12 DIAGNOSIS — E669 Obesity, unspecified: Secondary | ICD-10-CM | POA: Diagnosis not present

## 2020-07-12 DIAGNOSIS — G40909 Epilepsy, unspecified, not intractable, without status epilepticus: Secondary | ICD-10-CM | POA: Diagnosis not present

## 2020-07-12 DIAGNOSIS — M80051D Age-related osteoporosis with current pathological fracture, right femur, subsequent encounter for fracture with routine healing: Secondary | ICD-10-CM | POA: Diagnosis not present

## 2020-07-12 DIAGNOSIS — I2699 Other pulmonary embolism without acute cor pulmonale: Secondary | ICD-10-CM | POA: Diagnosis not present

## 2020-07-12 DIAGNOSIS — G5603 Carpal tunnel syndrome, bilateral upper limbs: Secondary | ICD-10-CM | POA: Diagnosis not present

## 2020-07-12 DIAGNOSIS — D62 Acute posthemorrhagic anemia: Secondary | ICD-10-CM | POA: Diagnosis not present

## 2020-07-15 DIAGNOSIS — I10 Essential (primary) hypertension: Secondary | ICD-10-CM | POA: Diagnosis not present

## 2020-07-15 DIAGNOSIS — M80051D Age-related osteoporosis with current pathological fracture, right femur, subsequent encounter for fracture with routine healing: Secondary | ICD-10-CM | POA: Diagnosis not present

## 2020-07-15 DIAGNOSIS — J449 Chronic obstructive pulmonary disease, unspecified: Secondary | ICD-10-CM | POA: Diagnosis not present

## 2020-07-15 DIAGNOSIS — E119 Type 2 diabetes mellitus without complications: Secondary | ICD-10-CM | POA: Diagnosis not present

## 2020-07-15 DIAGNOSIS — D62 Acute posthemorrhagic anemia: Secondary | ICD-10-CM | POA: Diagnosis not present

## 2020-07-15 DIAGNOSIS — Z96641 Presence of right artificial hip joint: Secondary | ICD-10-CM | POA: Diagnosis not present

## 2020-07-16 DIAGNOSIS — I10 Essential (primary) hypertension: Secondary | ICD-10-CM | POA: Diagnosis not present

## 2020-07-16 DIAGNOSIS — E119 Type 2 diabetes mellitus without complications: Secondary | ICD-10-CM | POA: Diagnosis not present

## 2020-07-16 DIAGNOSIS — J449 Chronic obstructive pulmonary disease, unspecified: Secondary | ICD-10-CM | POA: Diagnosis not present

## 2020-07-16 DIAGNOSIS — S72031D Displaced midcervical fracture of right femur, subsequent encounter for closed fracture with routine healing: Secondary | ICD-10-CM | POA: Diagnosis not present

## 2020-07-16 DIAGNOSIS — Z96641 Presence of right artificial hip joint: Secondary | ICD-10-CM | POA: Diagnosis not present

## 2020-07-16 DIAGNOSIS — M80051D Age-related osteoporosis with current pathological fracture, right femur, subsequent encounter for fracture with routine healing: Secondary | ICD-10-CM | POA: Diagnosis not present

## 2020-07-16 DIAGNOSIS — D62 Acute posthemorrhagic anemia: Secondary | ICD-10-CM | POA: Diagnosis not present

## 2020-07-19 DIAGNOSIS — M80051D Age-related osteoporosis with current pathological fracture, right femur, subsequent encounter for fracture with routine healing: Secondary | ICD-10-CM | POA: Diagnosis not present

## 2020-07-19 DIAGNOSIS — E119 Type 2 diabetes mellitus without complications: Secondary | ICD-10-CM | POA: Diagnosis not present

## 2020-07-19 DIAGNOSIS — J449 Chronic obstructive pulmonary disease, unspecified: Secondary | ICD-10-CM | POA: Diagnosis not present

## 2020-07-19 DIAGNOSIS — I10 Essential (primary) hypertension: Secondary | ICD-10-CM | POA: Diagnosis not present

## 2020-07-19 DIAGNOSIS — Z96641 Presence of right artificial hip joint: Secondary | ICD-10-CM | POA: Diagnosis not present

## 2020-07-19 DIAGNOSIS — D62 Acute posthemorrhagic anemia: Secondary | ICD-10-CM | POA: Diagnosis not present

## 2020-07-22 DIAGNOSIS — E119 Type 2 diabetes mellitus without complications: Secondary | ICD-10-CM | POA: Diagnosis not present

## 2020-07-22 DIAGNOSIS — I10 Essential (primary) hypertension: Secondary | ICD-10-CM | POA: Diagnosis not present

## 2020-07-22 DIAGNOSIS — J449 Chronic obstructive pulmonary disease, unspecified: Secondary | ICD-10-CM | POA: Diagnosis not present

## 2020-07-22 DIAGNOSIS — Z96641 Presence of right artificial hip joint: Secondary | ICD-10-CM | POA: Diagnosis not present

## 2020-07-22 DIAGNOSIS — M80051D Age-related osteoporosis with current pathological fracture, right femur, subsequent encounter for fracture with routine healing: Secondary | ICD-10-CM | POA: Diagnosis not present

## 2020-07-22 DIAGNOSIS — D62 Acute posthemorrhagic anemia: Secondary | ICD-10-CM | POA: Diagnosis not present

## 2020-07-24 DIAGNOSIS — E119 Type 2 diabetes mellitus without complications: Secondary | ICD-10-CM | POA: Diagnosis not present

## 2020-07-24 DIAGNOSIS — J449 Chronic obstructive pulmonary disease, unspecified: Secondary | ICD-10-CM | POA: Diagnosis not present

## 2020-07-24 DIAGNOSIS — I10 Essential (primary) hypertension: Secondary | ICD-10-CM | POA: Diagnosis not present

## 2020-07-24 DIAGNOSIS — D62 Acute posthemorrhagic anemia: Secondary | ICD-10-CM | POA: Diagnosis not present

## 2020-07-24 DIAGNOSIS — Z96641 Presence of right artificial hip joint: Secondary | ICD-10-CM | POA: Diagnosis not present

## 2020-07-24 DIAGNOSIS — M80051D Age-related osteoporosis with current pathological fracture, right femur, subsequent encounter for fracture with routine healing: Secondary | ICD-10-CM | POA: Diagnosis not present

## 2020-07-26 DIAGNOSIS — E119 Type 2 diabetes mellitus without complications: Secondary | ICD-10-CM | POA: Diagnosis not present

## 2020-07-26 DIAGNOSIS — J449 Chronic obstructive pulmonary disease, unspecified: Secondary | ICD-10-CM | POA: Diagnosis not present

## 2020-07-26 DIAGNOSIS — Z96641 Presence of right artificial hip joint: Secondary | ICD-10-CM | POA: Diagnosis not present

## 2020-07-26 DIAGNOSIS — D62 Acute posthemorrhagic anemia: Secondary | ICD-10-CM | POA: Diagnosis not present

## 2020-07-26 DIAGNOSIS — M80051D Age-related osteoporosis with current pathological fracture, right femur, subsequent encounter for fracture with routine healing: Secondary | ICD-10-CM | POA: Diagnosis not present

## 2020-07-26 DIAGNOSIS — I10 Essential (primary) hypertension: Secondary | ICD-10-CM | POA: Diagnosis not present

## 2020-07-29 DIAGNOSIS — M810 Age-related osteoporosis without current pathological fracture: Secondary | ICD-10-CM | POA: Diagnosis not present

## 2020-07-29 DIAGNOSIS — E039 Hypothyroidism, unspecified: Secondary | ICD-10-CM | POA: Diagnosis not present

## 2020-07-29 DIAGNOSIS — E1165 Type 2 diabetes mellitus with hyperglycemia: Secondary | ICD-10-CM | POA: Diagnosis not present

## 2020-07-29 DIAGNOSIS — Z794 Long term (current) use of insulin: Secondary | ICD-10-CM | POA: Diagnosis not present

## 2020-07-29 DIAGNOSIS — Q782 Osteopetrosis: Secondary | ICD-10-CM | POA: Diagnosis not present

## 2020-07-31 DIAGNOSIS — D62 Acute posthemorrhagic anemia: Secondary | ICD-10-CM | POA: Diagnosis not present

## 2020-07-31 DIAGNOSIS — J449 Chronic obstructive pulmonary disease, unspecified: Secondary | ICD-10-CM | POA: Diagnosis not present

## 2020-07-31 DIAGNOSIS — Z96641 Presence of right artificial hip joint: Secondary | ICD-10-CM | POA: Diagnosis not present

## 2020-07-31 DIAGNOSIS — M80051D Age-related osteoporosis with current pathological fracture, right femur, subsequent encounter for fracture with routine healing: Secondary | ICD-10-CM | POA: Diagnosis not present

## 2020-07-31 DIAGNOSIS — E119 Type 2 diabetes mellitus without complications: Secondary | ICD-10-CM | POA: Diagnosis not present

## 2020-07-31 DIAGNOSIS — I10 Essential (primary) hypertension: Secondary | ICD-10-CM | POA: Diagnosis not present

## 2020-08-02 DIAGNOSIS — D62 Acute posthemorrhagic anemia: Secondary | ICD-10-CM | POA: Diagnosis not present

## 2020-08-02 DIAGNOSIS — E119 Type 2 diabetes mellitus without complications: Secondary | ICD-10-CM | POA: Diagnosis not present

## 2020-08-02 DIAGNOSIS — Z96641 Presence of right artificial hip joint: Secondary | ICD-10-CM | POA: Diagnosis not present

## 2020-08-02 DIAGNOSIS — J449 Chronic obstructive pulmonary disease, unspecified: Secondary | ICD-10-CM | POA: Diagnosis not present

## 2020-08-02 DIAGNOSIS — I10 Essential (primary) hypertension: Secondary | ICD-10-CM | POA: Diagnosis not present

## 2020-08-02 DIAGNOSIS — M80051D Age-related osteoporosis with current pathological fracture, right femur, subsequent encounter for fracture with routine healing: Secondary | ICD-10-CM | POA: Diagnosis not present

## 2020-08-05 ENCOUNTER — Telehealth: Payer: Self-pay | Admitting: Neurology

## 2020-08-05 ENCOUNTER — Telehealth: Payer: Self-pay | Admitting: *Deleted

## 2020-08-05 DIAGNOSIS — M8588 Other specified disorders of bone density and structure, other site: Secondary | ICD-10-CM | POA: Diagnosis not present

## 2020-08-05 DIAGNOSIS — M810 Age-related osteoporosis without current pathological fracture: Secondary | ICD-10-CM | POA: Diagnosis not present

## 2020-08-05 NOTE — Telephone Encounter (Signed)
Patient called back, see health concerns message.

## 2020-08-05 NOTE — Telephone Encounter (Signed)
Called patient back.  Patient stated that she read nausea can cause a seizure and she has been nauseated for 5 days and she is afraid that she is going to have another seizure.  She had the colonoscopy and found she had cancer in her colon, she had surgery for the colon cancer back in November 2021.  She is also staying anxious and fearful all the time.    She also keep tremors in her hands and wants to talk to a doctor about that.    She wants to talk to Dr. Jannifer Franklin and understands he isn't here this week but would like to speak to a provider today.    Is also complaining that her blood pressure is not being controlled.

## 2020-08-05 NOTE — Telephone Encounter (Signed)
Pt called needs to speak to a nurse about her health. Please call [phone]

## 2020-08-05 NOTE — Telephone Encounter (Signed)
I called and left a message.  If she has multiple complaints it Loria just be best for her to get an appointment to come in to see Dr. Jannifer Franklin.

## 2020-08-05 NOTE — Telephone Encounter (Signed)
Pt is asking for a call to discuss her feeling of being nauseated  and having anxiety.  She said she was told by Dr Jannifer Franklin this is a sign of a seizure.  Pt states she doesn't want to experience that again because of the blood from chewing on her tongue.  Pt states her anxiety is bad and never leaves even with the prescribed medication from Dr Jannifer Franklin.  Please call pt to discuss.  Pt has asked this message be sent as high priority, when asked if she feels like she needs to go back to ED  Pt declined stating she was not too long ago released from the hospital.

## 2020-08-06 DIAGNOSIS — J449 Chronic obstructive pulmonary disease, unspecified: Secondary | ICD-10-CM | POA: Diagnosis not present

## 2020-08-06 DIAGNOSIS — M80051D Age-related osteoporosis with current pathological fracture, right femur, subsequent encounter for fracture with routine healing: Secondary | ICD-10-CM | POA: Diagnosis not present

## 2020-08-06 DIAGNOSIS — D62 Acute posthemorrhagic anemia: Secondary | ICD-10-CM | POA: Diagnosis not present

## 2020-08-06 DIAGNOSIS — E119 Type 2 diabetes mellitus without complications: Secondary | ICD-10-CM | POA: Diagnosis not present

## 2020-08-06 DIAGNOSIS — I10 Essential (primary) hypertension: Secondary | ICD-10-CM | POA: Diagnosis not present

## 2020-08-06 DIAGNOSIS — Z96641 Presence of right artificial hip joint: Secondary | ICD-10-CM | POA: Diagnosis not present

## 2020-08-07 MED ORDER — PRIMIDONE 50 MG PO TABS: 100.0000 mg | ORAL_TABLET | Freq: Two times a day (BID) | ORAL | 1 refills | Status: DC

## 2020-08-07 NOTE — Telephone Encounter (Signed)
I called the patient.  The patient was upset by somebody she had a conversation with last week, her tremors have worsened, she has had some nausea.  We can increase the primidone to 100 mg twice daily, this [place]t help both the tremor and the anxiety, she could if she wanted to take alprazolam 0.25 mg tablets, 1 up to 3 times daily for anxiety.

## 2020-08-07 NOTE — Addendum Note (Signed)
Addended by: Kathrynn Ducking on: 08/07/2020 05:15 PM   Modules accepted: Orders

## 2020-08-08 DIAGNOSIS — I10 Essential (primary) hypertension: Secondary | ICD-10-CM | POA: Diagnosis not present

## 2020-08-08 DIAGNOSIS — E119 Type 2 diabetes mellitus without complications: Secondary | ICD-10-CM | POA: Diagnosis not present

## 2020-08-08 DIAGNOSIS — D62 Acute posthemorrhagic anemia: Secondary | ICD-10-CM | POA: Diagnosis not present

## 2020-08-08 DIAGNOSIS — J449 Chronic obstructive pulmonary disease, unspecified: Secondary | ICD-10-CM | POA: Diagnosis not present

## 2020-08-08 DIAGNOSIS — Z96641 Presence of right artificial hip joint: Secondary | ICD-10-CM | POA: Diagnosis not present

## 2020-08-08 DIAGNOSIS — M80051D Age-related osteoporosis with current pathological fracture, right femur, subsequent encounter for fracture with routine healing: Secondary | ICD-10-CM | POA: Diagnosis not present

## 2020-08-10 DIAGNOSIS — J449 Chronic obstructive pulmonary disease, unspecified: Secondary | ICD-10-CM | POA: Diagnosis not present

## 2020-08-10 DIAGNOSIS — Z96641 Presence of right artificial hip joint: Secondary | ICD-10-CM | POA: Diagnosis not present

## 2020-08-10 DIAGNOSIS — I10 Essential (primary) hypertension: Secondary | ICD-10-CM | POA: Diagnosis not present

## 2020-08-10 DIAGNOSIS — M80051D Age-related osteoporosis with current pathological fracture, right femur, subsequent encounter for fracture with routine healing: Secondary | ICD-10-CM | POA: Diagnosis not present

## 2020-08-10 DIAGNOSIS — E119 Type 2 diabetes mellitus without complications: Secondary | ICD-10-CM | POA: Diagnosis not present

## 2020-08-10 DIAGNOSIS — D62 Acute posthemorrhagic anemia: Secondary | ICD-10-CM | POA: Diagnosis not present

## 2020-08-11 DIAGNOSIS — Z96641 Presence of right artificial hip joint: Secondary | ICD-10-CM | POA: Diagnosis not present

## 2020-08-11 DIAGNOSIS — J449 Chronic obstructive pulmonary disease, unspecified: Secondary | ICD-10-CM | POA: Diagnosis not present

## 2020-08-11 DIAGNOSIS — C189 Malignant neoplasm of colon, unspecified: Secondary | ICD-10-CM | POA: Diagnosis not present

## 2020-08-11 DIAGNOSIS — D62 Acute posthemorrhagic anemia: Secondary | ICD-10-CM | POA: Diagnosis not present

## 2020-08-11 DIAGNOSIS — M545 Low back pain, unspecified: Secondary | ICD-10-CM | POA: Diagnosis not present

## 2020-08-11 DIAGNOSIS — G250 Essential tremor: Secondary | ICD-10-CM | POA: Diagnosis not present

## 2020-08-11 DIAGNOSIS — K582 Mixed irritable bowel syndrome: Secondary | ICD-10-CM | POA: Diagnosis not present

## 2020-08-11 DIAGNOSIS — G8929 Other chronic pain: Secondary | ICD-10-CM | POA: Diagnosis not present

## 2020-08-11 DIAGNOSIS — F419 Anxiety disorder, unspecified: Secondary | ICD-10-CM | POA: Diagnosis not present

## 2020-08-11 DIAGNOSIS — M80051D Age-related osteoporosis with current pathological fracture, right femur, subsequent encounter for fracture with routine healing: Secondary | ICD-10-CM | POA: Diagnosis not present

## 2020-08-11 DIAGNOSIS — E669 Obesity, unspecified: Secondary | ICD-10-CM | POA: Diagnosis not present

## 2020-08-11 DIAGNOSIS — E538 Deficiency of other specified B group vitamins: Secondary | ICD-10-CM | POA: Diagnosis not present

## 2020-08-11 DIAGNOSIS — D630 Anemia in neoplastic disease: Secondary | ICD-10-CM | POA: Diagnosis not present

## 2020-08-11 DIAGNOSIS — I2699 Other pulmonary embolism without acute cor pulmonale: Secondary | ICD-10-CM | POA: Diagnosis not present

## 2020-08-11 DIAGNOSIS — G5603 Carpal tunnel syndrome, bilateral upper limbs: Secondary | ICD-10-CM | POA: Diagnosis not present

## 2020-08-11 DIAGNOSIS — E119 Type 2 diabetes mellitus without complications: Secondary | ICD-10-CM | POA: Diagnosis not present

## 2020-08-11 DIAGNOSIS — G40909 Epilepsy, unspecified, not intractable, without status epilepticus: Secondary | ICD-10-CM | POA: Diagnosis not present

## 2020-08-11 DIAGNOSIS — I6529 Occlusion and stenosis of unspecified carotid artery: Secondary | ICD-10-CM | POA: Diagnosis not present

## 2020-08-11 DIAGNOSIS — E042 Nontoxic multinodular goiter: Secondary | ICD-10-CM | POA: Diagnosis not present

## 2020-08-11 DIAGNOSIS — M1990 Unspecified osteoarthritis, unspecified site: Secondary | ICD-10-CM | POA: Diagnosis not present

## 2020-08-11 DIAGNOSIS — I10 Essential (primary) hypertension: Secondary | ICD-10-CM | POA: Diagnosis not present

## 2020-08-11 DIAGNOSIS — E039 Hypothyroidism, unspecified: Secondary | ICD-10-CM | POA: Diagnosis not present

## 2020-08-11 DIAGNOSIS — K219 Gastro-esophageal reflux disease without esophagitis: Secondary | ICD-10-CM | POA: Diagnosis not present

## 2020-08-11 DIAGNOSIS — I700 Atherosclerosis of aorta: Secondary | ICD-10-CM | POA: Diagnosis not present

## 2020-08-11 DIAGNOSIS — E785 Hyperlipidemia, unspecified: Secondary | ICD-10-CM | POA: Diagnosis not present

## 2020-08-12 DIAGNOSIS — J449 Chronic obstructive pulmonary disease, unspecified: Secondary | ICD-10-CM | POA: Diagnosis not present

## 2020-08-12 DIAGNOSIS — M80051D Age-related osteoporosis with current pathological fracture, right femur, subsequent encounter for fracture with routine healing: Secondary | ICD-10-CM | POA: Diagnosis not present

## 2020-08-12 DIAGNOSIS — Z96641 Presence of right artificial hip joint: Secondary | ICD-10-CM | POA: Diagnosis not present

## 2020-08-12 DIAGNOSIS — E119 Type 2 diabetes mellitus without complications: Secondary | ICD-10-CM | POA: Diagnosis not present

## 2020-08-12 DIAGNOSIS — D62 Acute posthemorrhagic anemia: Secondary | ICD-10-CM | POA: Diagnosis not present

## 2020-08-12 DIAGNOSIS — I10 Essential (primary) hypertension: Secondary | ICD-10-CM | POA: Diagnosis not present

## 2020-08-13 ENCOUNTER — Other Ambulatory Visit: Payer: Self-pay

## 2020-08-13 ENCOUNTER — Ambulatory Visit
Admission: RE | Admit: 2020-08-13 | Discharge: 2020-08-13 | Disposition: A | Discharge status: Home / Self Care | Payer: Medicare Other | Source: Ambulatory Visit | Attending: Family Medicine | Admitting: Family Medicine

## 2020-08-13 DIAGNOSIS — D62 Acute posthemorrhagic anemia: Secondary | ICD-10-CM | POA: Diagnosis not present

## 2020-08-13 DIAGNOSIS — Z96641 Presence of right artificial hip joint: Secondary | ICD-10-CM | POA: Diagnosis not present

## 2020-08-13 DIAGNOSIS — Z1231 Encounter for screening mammogram for malignant neoplasm of breast: Secondary | ICD-10-CM | POA: Diagnosis not present

## 2020-08-13 DIAGNOSIS — I10 Essential (primary) hypertension: Secondary | ICD-10-CM | POA: Diagnosis not present

## 2020-08-13 DIAGNOSIS — E119 Type 2 diabetes mellitus without complications: Secondary | ICD-10-CM | POA: Diagnosis not present

## 2020-08-13 DIAGNOSIS — J449 Chronic obstructive pulmonary disease, unspecified: Secondary | ICD-10-CM | POA: Diagnosis not present

## 2020-08-13 DIAGNOSIS — M80051D Age-related osteoporosis with current pathological fracture, right femur, subsequent encounter for fracture with routine healing: Secondary | ICD-10-CM | POA: Diagnosis not present

## 2020-08-16 ENCOUNTER — Emergency Department (HOSPITAL_COMMUNITY): Payer: Medicare Other

## 2020-08-16 ENCOUNTER — Inpatient Hospital Stay (HOSPITAL_COMMUNITY)
Admission: EM | Admit: 2020-08-16 | Discharge: 2020-08-22 | DRG: 918 | Disposition: A | Discharge status: Skilled Nursing Facility | Payer: Medicare Other | Attending: Internal Medicine | Admitting: Internal Medicine

## 2020-08-16 DIAGNOSIS — Z9841 Cataract extraction status, right eye: Secondary | ICD-10-CM

## 2020-08-16 DIAGNOSIS — W19XXXA Unspecified fall, initial encounter: Secondary | ICD-10-CM | POA: Diagnosis present

## 2020-08-16 DIAGNOSIS — R059 Cough, unspecified: Secondary | ICD-10-CM | POA: Diagnosis present

## 2020-08-16 DIAGNOSIS — Z20822 Contact with and (suspected) exposure to covid-19: Secondary | ICD-10-CM | POA: Diagnosis not present

## 2020-08-16 DIAGNOSIS — Z882 Allergy status to sulfonamides status: Secondary | ICD-10-CM

## 2020-08-16 DIAGNOSIS — Z9049 Acquired absence of other specified parts of digestive tract: Secondary | ICD-10-CM

## 2020-08-16 DIAGNOSIS — Z800 Family history of malignant neoplasm of digestive organs: Secondary | ICD-10-CM

## 2020-08-16 DIAGNOSIS — R52 Pain, unspecified: Secondary | ICD-10-CM | POA: Diagnosis not present

## 2020-08-16 DIAGNOSIS — R531 Weakness: Secondary | ICD-10-CM | POA: Diagnosis not present

## 2020-08-16 DIAGNOSIS — E871 Hypo-osmolality and hyponatremia: Secondary | ICD-10-CM | POA: Diagnosis not present

## 2020-08-16 DIAGNOSIS — Z79899 Other long term (current) drug therapy: Secondary | ICD-10-CM

## 2020-08-16 DIAGNOSIS — I10 Essential (primary) hypertension: Secondary | ICD-10-CM | POA: Diagnosis not present

## 2020-08-16 DIAGNOSIS — Z8349 Family history of other endocrine, nutritional and metabolic diseases: Secondary | ICD-10-CM

## 2020-08-16 DIAGNOSIS — Z23 Encounter for immunization: Secondary | ICD-10-CM | POA: Diagnosis not present

## 2020-08-16 DIAGNOSIS — M4854XA Collapsed vertebra, not elsewhere classified, thoracic region, initial encounter for fracture: Secondary | ICD-10-CM | POA: Diagnosis present

## 2020-08-16 DIAGNOSIS — T420X5A Adverse effect of hydantoin derivatives, initial encounter: Secondary | ICD-10-CM | POA: Diagnosis present

## 2020-08-16 DIAGNOSIS — Z91041 Radiographic dye allergy status: Secondary | ICD-10-CM

## 2020-08-16 DIAGNOSIS — Z79890 Hormone replacement therapy (postmenopausal): Secondary | ICD-10-CM

## 2020-08-16 DIAGNOSIS — M25572 Pain in left ankle and joints of left foot: Secondary | ICD-10-CM | POA: Diagnosis not present

## 2020-08-16 DIAGNOSIS — S0990XA Unspecified injury of head, initial encounter: Secondary | ICD-10-CM | POA: Diagnosis not present

## 2020-08-16 DIAGNOSIS — Z885 Allergy status to narcotic agent status: Secondary | ICD-10-CM

## 2020-08-16 DIAGNOSIS — T420X1A Poisoning by hydantoin derivatives, accidental (unintentional), initial encounter: Secondary | ICD-10-CM | POA: Diagnosis not present

## 2020-08-16 DIAGNOSIS — Z853 Personal history of malignant neoplasm of breast: Secondary | ICD-10-CM

## 2020-08-16 DIAGNOSIS — Z043 Encounter for examination and observation following other accident: Secondary | ICD-10-CM | POA: Diagnosis not present

## 2020-08-16 DIAGNOSIS — Z86011 Personal history of benign neoplasm of the brain: Secondary | ICD-10-CM

## 2020-08-16 DIAGNOSIS — R296 Repeated falls: Secondary | ICD-10-CM | POA: Diagnosis present

## 2020-08-16 DIAGNOSIS — Z888 Allergy status to other drugs, medicaments and biological substances status: Secondary | ICD-10-CM

## 2020-08-16 DIAGNOSIS — E041 Nontoxic single thyroid nodule: Secondary | ICD-10-CM | POA: Diagnosis present

## 2020-08-16 DIAGNOSIS — F419 Anxiety disorder, unspecified: Secondary | ICD-10-CM | POA: Diagnosis present

## 2020-08-16 DIAGNOSIS — Z8262 Family history of osteoporosis: Secondary | ICD-10-CM

## 2020-08-16 DIAGNOSIS — Z85038 Personal history of other malignant neoplasm of large intestine: Secondary | ICD-10-CM

## 2020-08-16 DIAGNOSIS — Z880 Allergy status to penicillin: Secondary | ICD-10-CM

## 2020-08-16 DIAGNOSIS — E785 Hyperlipidemia, unspecified: Secondary | ICD-10-CM | POA: Diagnosis present

## 2020-08-16 DIAGNOSIS — S22000A Wedge compression fracture of unspecified thoracic vertebra, initial encounter for closed fracture: Secondary | ICD-10-CM

## 2020-08-16 DIAGNOSIS — E039 Hypothyroidism, unspecified: Secondary | ICD-10-CM | POA: Diagnosis present

## 2020-08-16 DIAGNOSIS — N390 Urinary tract infection, site not specified: Secondary | ICD-10-CM | POA: Diagnosis not present

## 2020-08-16 DIAGNOSIS — Z923 Personal history of irradiation: Secondary | ICD-10-CM

## 2020-08-16 DIAGNOSIS — G40909 Epilepsy, unspecified, not intractable, without status epilepticus: Secondary | ICD-10-CM

## 2020-08-16 DIAGNOSIS — Z86010 Personal history of colonic polyps: Secondary | ICD-10-CM

## 2020-08-16 DIAGNOSIS — Z8042 Family history of malignant neoplasm of prostate: Secondary | ICD-10-CM

## 2020-08-16 DIAGNOSIS — E1165 Type 2 diabetes mellitus with hyperglycemia: Secondary | ICD-10-CM

## 2020-08-16 DIAGNOSIS — Z8249 Family history of ischemic heart disease and other diseases of the circulatory system: Secondary | ICD-10-CM

## 2020-08-16 DIAGNOSIS — F32A Depression, unspecified: Secondary | ICD-10-CM | POA: Diagnosis present

## 2020-08-16 DIAGNOSIS — Z90710 Acquired absence of both cervix and uterus: Secondary | ICD-10-CM

## 2020-08-16 DIAGNOSIS — Y92009 Unspecified place in unspecified non-institutional (private) residence as the place of occurrence of the external cause: Secondary | ICD-10-CM

## 2020-08-16 DIAGNOSIS — Z85820 Personal history of malignant melanoma of skin: Secondary | ICD-10-CM

## 2020-08-16 DIAGNOSIS — Z9109 Other allergy status, other than to drugs and biological substances: Secondary | ICD-10-CM

## 2020-08-16 DIAGNOSIS — Z794 Long term (current) use of insulin: Secondary | ICD-10-CM

## 2020-08-16 DIAGNOSIS — C50119 Malignant neoplasm of central portion of unspecified female breast: Secondary | ICD-10-CM | POA: Diagnosis present

## 2020-08-16 DIAGNOSIS — M545 Low back pain, unspecified: Secondary | ICD-10-CM | POA: Diagnosis not present

## 2020-08-16 DIAGNOSIS — E876 Hypokalemia: Secondary | ICD-10-CM | POA: Diagnosis present

## 2020-08-16 DIAGNOSIS — Z96641 Presence of right artificial hip joint: Secondary | ICD-10-CM | POA: Diagnosis present

## 2020-08-16 DIAGNOSIS — Z833 Family history of diabetes mellitus: Secondary | ICD-10-CM

## 2020-08-16 DIAGNOSIS — E119 Type 2 diabetes mellitus without complications: Secondary | ICD-10-CM | POA: Diagnosis present

## 2020-08-16 DIAGNOSIS — M542 Cervicalgia: Secondary | ICD-10-CM | POA: Diagnosis not present

## 2020-08-16 LAB — CBG MONITORING, ED: Glucose-Capillary: 55 mg/dL — ABNORMAL LOW (ref 70–99)

## 2020-08-16 MED ORDER — FENTANYL CITRATE (PF) 100 MCG/2ML IJ SOLN
50.0000 ug | Freq: Once | INTRAMUSCULAR | Status: AC
Start: 2020-08-16 — End: 2020-08-16
  Administered 2020-08-16: 50 ug via INTRAVENOUS
  Filled 2020-08-16: qty 2

## 2020-08-16 NOTE — ED Notes (Signed)
Patient transported to X-ray 
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
## 2020-08-16 NOTE — ED Notes (Signed)
RN placed 2 warm blankets on pt. Pt stated that she needs more warm blankets, to which this RN explained that pt's temperature is slightly elevated & we cannot give more blankets at this time, in order to avoid further temperature elevation

## 2020-08-16 NOTE — ED Notes (Signed)
C-Collar adjusted by this RN and Rob, PA to remove pt jewlery per pt yelling that "it is digging into my skin." After removal of jewelry and C-collar replacement, pt screamed at this RN and Rob, [place] to keep C-collar off. This RN told pt that the C-collar must remain on her and hold her neck in place until her imaging has been finished and evaluated, otherwise she risks paralyzation. Pt continued screaming at this RN and Rob, PA to remove C-collar. Rob, PA removed collar and told pt that she must keep her neck still if she refuses to wear the collar.

## 2020-08-16 NOTE — ED Notes (Signed)
Pt screamed at this RN "What are you doing to me?" as this RN administered fentanyl IV. This RN explained that she was giving pt pain medication through her IV, to which pt replied: "I thought you already did that! And it ain't working." This RN explained that no pain medication have been given to pt prior to this administration of fentanyl.

## 2020-08-16 NOTE — ED Provider Notes (Signed)
[facility] EMERGENCY DEPARTMENT Provider Note   CSN: [id number] Arrival date & time: 08/16/20  2139     History Chief Complaint  Patient presents with  . Fall    [name] is a 87 y.o. female.  Patient presents to the ED with a chief complaint of fall.  She states that she has been falling frequently at home.  She reports neck pain and low back pain.  She reports hitting her head, but denies LOC.  She states that she is able to move her arms and legs, but it causes pain in her back.  She was BIB EMS from home.  The history is provided by the patient. No language interpreter was used.       Past Medical History:  Diagnosis Date  . Anemia   . Anxiety   . Asthma   . Breast cancer ([place]) 2014   right breast  . Cancer of right breast ([place]) 12/26/12   right breast 12:00 o'clock, DCIS  . Carotid artery disease ([place])   . Carpal tunnel syndrome, bilateral   . Chronic bronchitis ([place])   . Chronic cough   . Chronic facial pain   . Chronic foot pain   . Colon cancer ([place]) dx'd 11/2019  . Complication of anesthesia    Sore jaw; could not chew or move mouth, prolonged sedation  . Convulsions/seizures ([place]) 10/16/2014  . Diabetes mellitus    type 2 niddm x 20 years  . Dyslipidemia   . Ejection fraction   . Gait abnormality 12/04/2019  . GERD (gastroesophageal reflux disease)   . Hammer toe    bilateral  . History of colonic polyps   . History of meningioma   . HTN (hypertension)   . Hx of radiation therapy 03/07/13- 03/29/13   right breast 4250 cGy 17 sessions  . Hyperlipidemia   . Hypokalemia   . Hyponatremia   . Hypothyroidism   . IBS (irritable bowel syndrome)   . Melanoma ([place])   . Metatarsal bone fracture 2014  . Multiple drug allergies   . Nontoxic thyroid nodule   . Obesity   . Osteoarthritis   . Osteoporosis   . Palpitations   . Personal history of radiation therapy 2014  . Seasonal allergies   . Skin cancer   . Syncope   . Tremor,  essential 08/18/2016  . Vitamin B12 deficiency   . Vitamin D deficiency     Patient Active Problem List   Diagnosis Date Noted  . Postoperative anemia due to acute blood loss 06/06/2020  . Leukocytosis   . Closed right hip fracture ([place]) 06/04/2020  . Constipation 05/27/2020  . Dyslipidemia 05/27/2020  . Enterocolitis due to Clostridium difficile, not specified as recurrent 05/27/2020  . Pleural effusion on right 04/01/2020  . Acute respiratory failure with hypoxia ([place]) 04/01/2020  . Pneumonia of right lung due to infectious organism 04/01/2020  . Uncontrolled type 2 diabetes mellitus with hyperglycemia, with long-term current use of insulin ([place]) 04/01/2020  . Abnormal CT of the chest 04/01/2020  . Subacute pulmonary embolism ([place]) 04/01/2020  . Acute metabolic encephalopathy 02/17/5101  . Adenocarcinoma of colon ([place]) 04/01/2020  . Nausea & vomiting 03/12/2020  . Cancer of ascending colon pT3pN0 (0/12 LN) s/p lap right colectomy 03/06/2020 03/09/2020  . History of multiple allergies 03/09/2020  . S/P right hemicolectomy 03/06/2020  . Closed fracture of shaft of tibia 02/12/2020  . Abnormal feces 12/11/2019  . Gait abnormality 12/04/2019  .  Impaired left ventricular function 09/05/2019  . Hypokalemia   . Effusion of left knee joint   . Hyponatremia   . Left patella fracture 10/26/2018  . Fracture of patella 10/26/2018  . Pain in left knee 09/29/2018  . Acquired hypothyroidism 08/25/2017  . 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 Hammer toe 08/25/2017  . History of breast cancer 08/25/2017  . Nontoxic thyroid nodule 08/25/2017  . Osteopetrosis 08/25/2017  . Dysuria 01/19/2017  . Vitamin D deficiency 11/18/2016  . Tremor, essential 08/18/2016  . Seizure disorder ([place]) 10/16/2014  . Brain tumor (benign) ([place]) 10/18/2013  . Subdural hemorrhage ([place]) 10/09/2013  . Anxiety 10/09/2013  . Compression fracture of T12 vertebra ([place]) 10/09/2013  . Nontoxic  multinodular goiter 09/05/2013  . Syncope 08/27/2013  . Carotid artery disease ([place]) 08/27/2013  . Abnormal thyroid ultrasound 08/27/2013  . Cancer of central portion of female breast ([place]) 12/30/2012  . Osteoporosis 03/06/2010  . ASTHMA 03/05/2009  . Asthma 03/05/2009  . IRRITABLE BOWEL SYNDROME  diarrhea type 03/21/2008  . ANEMIA-NOS 12/16/2006  . GERD 12/16/2006  . Insulin-requiring or dependent type II diabetes mellitus ([place]) 10/29/2006  . Mixed hyperlipidemia 10/29/2006  . Essential hypertension 10/29/2006  . Allergic rhinitis, cause unspecified 10/29/2006  . OSTEOARTHRITIS 10/29/2006    Past Surgical History:  Procedure Laterality Date  . ABDOMINAL HYSTERECTOMY    . BIOPSY  11/07/2019   Procedure: BIOPSY;  Surgeon: Ronald Lobo, MD;  Location: [facility];  Service: Endoscopy;;  . BRAIN SURGERY    . BREAST BIOPSY Right 01/24/2013   Procedure: RE-EXCICION OF BREAST CANCER, ANTERIOR MARGINS;  Surgeon: Edward Jolly, MD;  Location: [facility];  Service: General;  Laterality: Right;  . BREAST LUMPECTOMY Right 2014  . BREAST LUMPECTOMY WITH NEEDLE LOCALIZATION Right 01/17/2013   Procedure: BREAST LUMPECTOMY WITH NEEDLE LOCALIZATION;  Surgeon: Edward Jolly, MD;  Location: [facility];  Service: General;  Laterality: Right;  . BUNIONECTOMY Bilateral   . CATARACT EXTRACTION W/ INTRAOCULAR LENS IMPLANT Right   . CHOLECYSTECTOMY    . COLONOSCOPY    . COLONOSCOPY WITH PROPOFOL N/A 11/07/2019   Procedure: COLONOSCOPY WITH PROPOFOL;  Surgeon: Ronald Lobo, MD;  Location: [facility];  Service: Endoscopy;  Laterality: N/A;  . CRANIOTOMY Right 10/18/2013   Procedure: CRANIOTOMY TUMOR EXCISION;  Surgeon: Floyce Stakes, MD;  Location: [facility];  Service: Neurosurgery;  Laterality: Right;  . ESOPHAGOGASTRODUODENOSCOPY (EGD) WITH PROPOFOL N/A 11/07/2019   Procedure: ESOPHAGOGASTRODUODENOSCOPY (EGD) WITH PROPOFOL;  Surgeon: Ronald Lobo, MD;  Location: [facility];  Service:  Endoscopy;  Laterality: N/A;  . EYE SURGERY    . HERNIA REPAIR    . IR THORACENTESIS ASP PLEURAL SPACE W/IMG GUIDE  04/01/2020  . KNEE ARTHROSCOPY Bilateral   . LAPAROSCOPIC RIGHT HEMI COLECTOMY Right 03/06/2020   Procedure: LAPAROSCOPIC RIGHT HEMI COLECTOMY WITH TAP BLOCK AND LYSIS OF ADHESIONS;  Surgeon: Ileana Roup, MD;  Location: [facility];  Service: General;  Laterality: Right;  . POLYPECTOMY     small adenomatous  . TOTAL HIP ARTHROPLASTY Right 06/05/2020   Procedure: ARTHROPLASTY  ANTERIOR APPROACH. RIGHT HIP;  Surgeon: Rod Can, MD;  Location: [facility];  Service: Orthopedics;  Laterality: Right;  . ULNAR TUNNEL RELEASE       OB History   No obstetric history on file.     Family History  Problem Relation Age of Onset  . Heart disease Mother   . Osteoporosis Mother   . Diabetes Father   .  Pancreatic cancer Father   . Colon cancer Other   . Bone cancer Sister   . Prostate cancer Brother   . Colon cancer Brother   . Rectal cancer Sister   . Thyroid disease Sister        benign goiter resected    Social History   Tobacco Use  . Smoking status: Never Smoker  . Smokeless tobacco: Never Used  Vaping Use  . Vaping Use: Never used  Substance Use Topics  . Alcohol use: No  . Drug use: No    Home Medications Prior to Admission medications   Medication Sig Start Date End Date Taking? Authorizing Provider  acetaminophen (TYLENOL) 500 MG tablet Take 1,000 mg by mouth every 8 (eight) hours as needed.    [provider]  Biotin 1000 MCG tablet Take 1,000 mcg by mouth daily.    [provider]  bisacodyl (DULCOLAX) 10 MG suppository Place 10 mg rectally as needed for moderate constipation.    [provider]  Blood Glucose Monitoring Suppl (PRODIGY VOICE BLOOD GLUCOSE) w/Device KIT Use to check blood sugar 1 time per day. 10/18/15   Renato Shin, MD  Cholecalciferol (VITAMIN D-3) 125 MCG (5000 UT) TABS Take 5,000 Units by mouth daily.     [provider]  cholestyramine (QUESTRAN) 4 g packet Take 4 g by mouth every 12 (twelve) hours as needed (diarrhea).    [provider]  diclofenac Sodium (VOLTAREN) 1 % GEL Apply 2 g topically 4 (four) times daily. 06/01/19   [provider]  escitalopram (LEXAPRO) 10 MG tablet Take 1 tablet (10 mg total) by mouth daily. 07/08/20   Fargo, Amy E, NP  feeding supplement, GLUCERNA SHAKE, (GLUCERNA SHAKE) LIQD Take 237 mLs by mouth 3 (three) times daily between meals.    [provider]  furosemide (LASIX) 20 MG tablet Take 1 tablet (20 mg total) by mouth daily. 07/08/20   Fargo, Amy E, NP  glucose blood (GLUCOSE METER TEST) test strip See admin instructions.    [provider]  Glucose Blood (PRODIGY VOICE BLOOD GLUCOSE VI)     [provider]  insulin aspart (NOVOLOG) 100 UNIT/ML FlexPen Inject 2-12 Units into the skin See admin instructions. Injects 2-10 units of insulin under the skin 3 times a day per sliding scale: CBG 0-150: 2 units; CBG 151-200: 4 units; CBG 201-250: 6 units; CBG 251-300: 8 units; CBG 301-350: 10 units; CBG 351-400: 12 units; CBG >400: 12 units and notify the MD/NP 07/08/20   Cleophas Dunker, Amy E, NP  insulin glargine (LANTUS SOLOSTAR) 100 UNIT/ML Solostar Pen Inject 14 Units into the skin at bedtime. 07/08/20   Fargo, Amy E, NP  Insulin Pen Needle 32G X 4 MM MISC Used to inject insulin 3x daily 11/19/16   Renato Shin, MD  levothyroxine (SYNTHROID) 75 MCG tablet Take 1 tablet (75 mcg total) by mouth daily before breakfast. 07/08/20   Fargo, Amy E, NP  lidocaine (LIDODERM) 5 % Place 1 patch onto the skin daily. Remove & Discard patch within 12 hours or as directed by MD 07/08/20   Windell Moulding E, NP  loperamide (IMODIUM) 2 MG capsule Take 4 mg by mouth 2 (two) times daily. For Chronic Diarrhea    [provider]  magnesium hydroxide (MILK OF MAGNESIA) 400 MG/5ML suspension Take by mouth daily as needed for mild constipation.    [provider]  ondansetron (ZOFRAN-ODT) 4 MG disintegrating tablet Take 1 tablet (4 mg total)  by mouth every 6 (six) hours as needed for nausea. 03/19/20   Ileana Roup, MD  pantoprazole (PROTONIX) 40 MG tablet Take 1 tablet (40 mg total) by mouth daily at 6 (six) AM. 07/08/20   Fargo, Amy E, NP  phenytoin (DILANTIN) 100 MG ER capsule Take 1 capsule (100 mg total) by mouth daily. 07/08/20   Fargo, Amy E, NP  phenytoin (DILANTIN) 100 MG ER capsule Take 100 mg by mouth 2 (two) times daily. At bedtime    [provider]  phenytoin (DILANTIN) 50 MG tablet Chew 1 tablet (50 mg total) by mouth daily. Take with 124m capsule for a total of 1590mevery morning. 07/08/20   Fargo, Amy E, NP  Polyethyl Glycol-Propyl Glycol (SYSTANE) 0.4-0.3 % SOLN Place 2 drops into both eyes every 12 (twelve) hours as needed (dry eyes).    [provider]  polyethylene glycol (MIRALAX / GLYCOLAX) 17 g packet Take 17 g by mouth daily as needed.    [provider]  primidone (MYSOLINE) 50 MG tablet Take 2 tablets (100 mg total) by mouth in the morning and at bedtime. 08/07/20   WiKathrynn DuckingMD  senna-docusate (SENOKOT-S) 8.6-50 MG tablet Take 1 tablet by mouth 2 (two) times daily. 06/10/20   ThEugenie FillerMD  Thiamine HCl (VITAMIN B-1) 250 MG tablet Take 250 mg by mouth at bedtime.    [provider]  trandolapril-verapamil (TARKA) 2-240 MG tablet Take 1 tablet by mouth at bedtime. 07/08/20   FaYvonna AlanisNP    Allergies    Bystolic [nebivolol hcl], Carbamazepine, Cholestyramine, Hydrazine yellow [tartrazine], Morphine, Niacin, Niaspan [niacin er], Norvasc [amlodipine besylate], Optivar [azelastine hcl], Repaglinide, Sular [nisoldipine er], Telmisartan, Amoxicillin-pot clavulanate, Cefdinir, Ciprofloxacin hcl, Clonidine, Clonidine hydrochloride, Codeine, Ezetimibe, Hydroxychloroquine, Naproxen, Sulfa antibiotics, Ziac [bisoprolol-hydrochlorothiazide], Amlodipine besy-benazepril hcl,  Azelastine, Elavil [amitriptyline], Empagliflozin, Hydralazine, Iodine i 131 tositumomab, Kenalog [triamcinolone], Keppra [levetiracetam], Lamotrigine, Norvasc [amlodipine], Other, Pregabalin, Pseudoephedrine, Pseudoephedrine-guaifenesin er, Risedronate, Ru-hist d [brompheniramine-phenylephrine], Sumatriptan, Topiramate, Ace inhibitors, Actonel [risedronate sodium], Amlodipine besylate, Aspirin, Atacand [candesartan], Bextra [valdecoxib], Bisoprolol-hydrochlorothiazide, Candesartan cilexetil, Cefadroxil, Celecoxib, Hydrocodone, Hydrocodone-acetaminophen, Iodinated diagnostic agents, Meloxicam, Methylprednisolone sodium succinate, Nabumetone, Penicillins, Pseudoephedrine-guaifenesin, Risedronate sodium, Rofecoxib, Ru-tuss [chlorphen-pse-atrop-hyos-scop], Sulfonamide derivatives, Sulphur [elemental sulfur], Telithromycin, Terfenadine, Trandolapril-verapamil hcl er, Trandolapril-verapamil hcl er, and Valium [diazepam]  Review of Systems   Review of Systems  All other systems reviewed and are negative.   Physical Exam Updated Vital Signs BP (!) 147/63 (BP Location: Right Arm)   Pulse 75   Temp 100.1 F (37.8 C) (Oral)   Resp 16   Ht '5\' 7"'  (1.702 m)   Wt 62.1 kg   SpO2 98%   BMI 21.46 kg/m   Physical Exam Vitals and nursing note reviewed.  Constitutional:      General: She is not in acute distress.    Appearance: She is well-developed.  HENT:     Head: Normocephalic and atraumatic.  Eyes:     Conjunctiva/sclera: Conjunctivae normal.  Cardiovascular:     Rate and Rhythm: Normal rate and regular rhythm.     Heart sounds: No murmur heard.   Pulmonary:     Effort: Pulmonary effort is normal. No respiratory distress.     Breath sounds: Normal breath sounds.  Abdominal:     Palpations: Abdomen is soft.     Tenderness: There is no abdominal tenderness.  Musculoskeletal:        General: Normal range of motion.     Cervical back: Neck supple.  Skin:  General: Skin is warm and dry.   Neurological:     Mental Status: She is alert and oriented to person, place, and time.  Psychiatric:        Mood and Affect: Mood normal.        Behavior: Behavior normal.     ED Results / Procedures / Treatments   Labs (all labs ordered are listed, but only abnormal results are displayed) Labs Reviewed  CBC WITH DIFFERENTIAL/PLATELET  COMPREHENSIVE METABOLIC PANEL  URINALYSIS, ROUTINE W REFLEX MICROSCOPIC  CBG MONITORING, ED    EKG None  Radiology No results found.  Procedures Procedures   Medications Ordered in ED Medications  fentaNYL (SUBLIMAZE) injection 50 mcg (has no administration in time range)    ED Course  I have reviewed the triage vital signs and the nursing notes.  Pertinent labs & imaging results that were available during my care of the patient were reviewed by me and considered in my medical decision making (see chart for details).    MDM Rules/Calculators/A&P                          Patient ripped off c-collar.  Instructed her to hold still, but she refuses reapplication of collar.   Patient here with recurrent falls.  Has fallen 4 times in the last week.  Laboratory work-up ordered.  Dilantin level is high.  Could be Dilantin toxicity causing her to be unstable.  Urinalysis is also abnormal, could have been contributing to UTI.  She does have mildly elevated temperature.    Extensive allergy list.   Patient seen by and discussed with Dr. Christy Gentles, who agrees with plan for admission.  Appreciate Dr. Hal Hope for admitting.    Final Clinical Impression(s) / ED Diagnoses Final diagnoses:  Fall    Rx / DC Orders ED Discharge Orders    None       Montine Circle, PA-C 08/17/20 0344    Ripley Fraise, MD 08/26/20 2306

## 2020-08-17 ENCOUNTER — Observation Stay (HOSPITAL_COMMUNITY): Payer: Medicare Other

## 2020-08-17 ENCOUNTER — Encounter (HOSPITAL_COMMUNITY): Payer: Self-pay | Admitting: Internal Medicine

## 2020-08-17 ENCOUNTER — Emergency Department (HOSPITAL_COMMUNITY): Payer: Medicare Other

## 2020-08-17 DIAGNOSIS — G40909 Epilepsy, unspecified, not intractable, without status epilepticus: Secondary | ICD-10-CM | POA: Diagnosis present

## 2020-08-17 DIAGNOSIS — S22060A Wedge compression fracture of T7-T8 vertebra, initial encounter for closed fracture: Secondary | ICD-10-CM | POA: Diagnosis not present

## 2020-08-17 DIAGNOSIS — Z853 Personal history of malignant neoplasm of breast: Secondary | ICD-10-CM | POA: Diagnosis not present

## 2020-08-17 DIAGNOSIS — Z882 Allergy status to sulfonamides status: Secondary | ICD-10-CM | POA: Diagnosis not present

## 2020-08-17 DIAGNOSIS — M542 Cervicalgia: Secondary | ICD-10-CM | POA: Diagnosis not present

## 2020-08-17 DIAGNOSIS — Z20822 Contact with and (suspected) exposure to covid-19: Secondary | ICD-10-CM | POA: Diagnosis present

## 2020-08-17 DIAGNOSIS — M47812 Spondylosis without myelopathy or radiculopathy, cervical region: Secondary | ICD-10-CM | POA: Diagnosis not present

## 2020-08-17 DIAGNOSIS — M2550 Pain in unspecified joint: Secondary | ICD-10-CM | POA: Diagnosis not present

## 2020-08-17 DIAGNOSIS — I69828 Other speech and language deficits following other cerebrovascular disease: Secondary | ICD-10-CM | POA: Diagnosis not present

## 2020-08-17 DIAGNOSIS — R410 Disorientation, unspecified: Secondary | ICD-10-CM | POA: Diagnosis not present

## 2020-08-17 DIAGNOSIS — R52 Pain, unspecified: Secondary | ICD-10-CM | POA: Diagnosis not present

## 2020-08-17 DIAGNOSIS — Z880 Allergy status to penicillin: Secondary | ICD-10-CM | POA: Diagnosis not present

## 2020-08-17 DIAGNOSIS — G9389 Other specified disorders of brain: Secondary | ICD-10-CM | POA: Diagnosis not present

## 2020-08-17 DIAGNOSIS — T420X5D Adverse effect of hydantoin derivatives, subsequent encounter: Secondary | ICD-10-CM | POA: Diagnosis not present

## 2020-08-17 DIAGNOSIS — Z9181 History of falling: Secondary | ICD-10-CM | POA: Diagnosis not present

## 2020-08-17 DIAGNOSIS — S22080A Wedge compression fracture of T11-T12 vertebra, initial encounter for closed fracture: Secondary | ICD-10-CM | POA: Diagnosis not present

## 2020-08-17 DIAGNOSIS — I10 Essential (primary) hypertension: Secondary | ICD-10-CM

## 2020-08-17 DIAGNOSIS — Z885 Allergy status to narcotic agent status: Secondary | ICD-10-CM | POA: Diagnosis not present

## 2020-08-17 DIAGNOSIS — Z23 Encounter for immunization: Secondary | ICD-10-CM | POA: Diagnosis not present

## 2020-08-17 DIAGNOSIS — R41841 Cognitive communication deficit: Secondary | ICD-10-CM | POA: Diagnosis not present

## 2020-08-17 DIAGNOSIS — R55 Syncope and collapse: Secondary | ICD-10-CM | POA: Diagnosis not present

## 2020-08-17 DIAGNOSIS — Z7401 Bed confinement status: Secondary | ICD-10-CM | POA: Diagnosis not present

## 2020-08-17 DIAGNOSIS — T420X1A Poisoning by hydantoin derivatives, accidental (unintentional), initial encounter: Principal | ICD-10-CM

## 2020-08-17 DIAGNOSIS — Y92009 Unspecified place in unspecified non-institutional (private) residence as the place of occurrence of the external cause: Secondary | ICD-10-CM | POA: Diagnosis not present

## 2020-08-17 DIAGNOSIS — S22070A Wedge compression fracture of T9-T10 vertebra, initial encounter for closed fracture: Secondary | ICD-10-CM | POA: Diagnosis not present

## 2020-08-17 DIAGNOSIS — N390 Urinary tract infection, site not specified: Secondary | ICD-10-CM | POA: Diagnosis present

## 2020-08-17 DIAGNOSIS — M4854XA Collapsed vertebra, not elsewhere classified, thoracic region, initial encounter for fracture: Secondary | ICD-10-CM | POA: Diagnosis present

## 2020-08-17 DIAGNOSIS — Z794 Long term (current) use of insulin: Secondary | ICD-10-CM | POA: Diagnosis not present

## 2020-08-17 DIAGNOSIS — T420X5A Adverse effect of hydantoin derivatives, initial encounter: Secondary | ICD-10-CM | POA: Diagnosis present

## 2020-08-17 DIAGNOSIS — C182 Malignant neoplasm of ascending colon: Secondary | ICD-10-CM | POA: Diagnosis not present

## 2020-08-17 DIAGNOSIS — R2681 Unsteadiness on feet: Secondary | ICD-10-CM | POA: Diagnosis not present

## 2020-08-17 DIAGNOSIS — Z888 Allergy status to other drugs, medicaments and biological substances status: Secondary | ICD-10-CM | POA: Diagnosis not present

## 2020-08-17 DIAGNOSIS — M4802 Spinal stenosis, cervical region: Secondary | ICD-10-CM | POA: Diagnosis not present

## 2020-08-17 DIAGNOSIS — M5023 Other cervical disc displacement, cervicothoracic region: Secondary | ICD-10-CM | POA: Diagnosis not present

## 2020-08-17 DIAGNOSIS — M545 Low back pain, unspecified: Secondary | ICD-10-CM | POA: Diagnosis not present

## 2020-08-17 DIAGNOSIS — E119 Type 2 diabetes mellitus without complications: Secondary | ICD-10-CM | POA: Diagnosis present

## 2020-08-17 DIAGNOSIS — R059 Cough, unspecified: Secondary | ICD-10-CM | POA: Diagnosis present

## 2020-08-17 DIAGNOSIS — W19XXXA Unspecified fall, initial encounter: Secondary | ICD-10-CM | POA: Diagnosis present

## 2020-08-17 DIAGNOSIS — Z85038 Personal history of other malignant neoplasm of large intestine: Secondary | ICD-10-CM | POA: Diagnosis not present

## 2020-08-17 DIAGNOSIS — M810 Age-related osteoporosis without current pathological fracture: Secondary | ICD-10-CM | POA: Diagnosis not present

## 2020-08-17 DIAGNOSIS — E1165 Type 2 diabetes mellitus with hyperglycemia: Secondary | ICD-10-CM | POA: Diagnosis not present

## 2020-08-17 DIAGNOSIS — F32A Depression, unspecified: Secondary | ICD-10-CM | POA: Diagnosis present

## 2020-08-17 DIAGNOSIS — R5381 Other malaise: Secondary | ICD-10-CM | POA: Diagnosis not present

## 2020-08-17 DIAGNOSIS — S22009D Unspecified fracture of unspecified thoracic vertebra, subsequent encounter for fracture with routine healing: Secondary | ICD-10-CM | POA: Diagnosis not present

## 2020-08-17 DIAGNOSIS — G40919 Epilepsy, unspecified, intractable, without status epilepticus: Secondary | ICD-10-CM | POA: Diagnosis not present

## 2020-08-17 DIAGNOSIS — R296 Repeated falls: Secondary | ICD-10-CM | POA: Diagnosis present

## 2020-08-17 DIAGNOSIS — E871 Hypo-osmolality and hyponatremia: Secondary | ICD-10-CM | POA: Diagnosis present

## 2020-08-17 DIAGNOSIS — Z833 Family history of diabetes mellitus: Secondary | ICD-10-CM | POA: Diagnosis not present

## 2020-08-17 DIAGNOSIS — D649 Anemia, unspecified: Secondary | ICD-10-CM | POA: Diagnosis not present

## 2020-08-17 DIAGNOSIS — E039 Hypothyroidism, unspecified: Secondary | ICD-10-CM | POA: Diagnosis present

## 2020-08-17 DIAGNOSIS — Z91041 Radiographic dye allergy status: Secondary | ICD-10-CM | POA: Diagnosis not present

## 2020-08-17 DIAGNOSIS — M5021 Other cervical disc displacement,  high cervical region: Secondary | ICD-10-CM | POA: Diagnosis not present

## 2020-08-17 DIAGNOSIS — Z9109 Other allergy status, other than to drugs and biological substances: Secondary | ICD-10-CM | POA: Diagnosis not present

## 2020-08-17 DIAGNOSIS — D62 Acute posthemorrhagic anemia: Secondary | ICD-10-CM | POA: Diagnosis not present

## 2020-08-17 DIAGNOSIS — Z043 Encounter for examination and observation following other accident: Secondary | ICD-10-CM | POA: Diagnosis not present

## 2020-08-17 DIAGNOSIS — E041 Nontoxic single thyroid nodule: Secondary | ICD-10-CM | POA: Diagnosis present

## 2020-08-17 DIAGNOSIS — M6281 Muscle weakness (generalized): Secondary | ICD-10-CM | POA: Diagnosis not present

## 2020-08-17 LAB — CK: Total CK: 55 U/L (ref 38–234)

## 2020-08-17 LAB — CBC
HCT: 35.8 % — ABNORMAL LOW (ref 36.0–46.0)
Hemoglobin: 12 g/dL (ref 12.0–15.0)
MCH: 32.6 pg (ref 26.0–34.0)
MCHC: 33.5 g/dL (ref 30.0–36.0)
MCV: 97.3 fL (ref 80.0–100.0)
Platelets: 251 10*3/uL (ref 150–400)
RBC: 3.68 MIL/uL — ABNORMAL LOW (ref 3.87–5.11)
RDW: 13.2 % (ref 11.5–15.5)
WBC: 7.1 10*3/uL (ref 4.0–10.5)
nRBC: 0 % (ref 0.0–0.2)

## 2020-08-17 LAB — COMPREHENSIVE METABOLIC PANEL
ALT: 23 U/L (ref 0–44)
AST: 26 U/L (ref 15–41)
Albumin: 3.9 g/dL (ref 3.5–5.0)
Alkaline Phosphatase: 95 U/L (ref 38–126)
Anion gap: 8 (ref 5–15)
BUN: 12 mg/dL (ref 8–23)
CO2: 20 mmol/L — ABNORMAL LOW (ref 22–32)
Calcium: 9.2 mg/dL (ref 8.9–10.3)
Chloride: 104 mmol/L (ref 98–111)
Creatinine, Ser: 0.81 mg/dL (ref 0.44–1.00)
GFR, Estimated: >60 mL/min (ref >60)
Glucose, Bld: 83 mg/dL (ref 70–99)
Potassium: 5.3 mmol/L — ABNORMAL HIGH (ref 3.5–5.1)
Sodium: 132 mmol/L — ABNORMAL LOW (ref 135–145)
Total Bilirubin: 0.8 mg/dL (ref 0.3–1.2)
Total Protein: 6.3 g/dL — ABNORMAL LOW (ref 6.5–8.1)

## 2020-08-17 LAB — CBC WITH DIFFERENTIAL/PLATELET
Abs Immature Granulocytes: 0.04 10*3/uL (ref 0.00–0.07)
Basophils Absolute: 0 10*3/uL (ref 0.0–0.1)
Basophils Relative: 0 %
Eosinophils Absolute: 0.1 10*3/uL (ref 0.0–0.5)
Eosinophils Relative: 1 %
HCT: 36.9 % (ref 36.0–46.0)
Hemoglobin: 12.5 g/dL (ref 12.0–15.0)
Immature Granulocytes: 1 %
Lymphocytes Relative: 12 %
Lymphs Abs: 1 10*3/uL (ref 0.7–4.0)
MCH: 32.8 pg (ref 26.0–34.0)
MCHC: 33.9 g/dL (ref 30.0–36.0)
MCV: 96.9 fL (ref 80.0–100.0)
Monocytes Absolute: 0.4 10*3/uL (ref 0.1–1.0)
Monocytes Relative: 5 %
Neutro Abs: 6.9 10*3/uL (ref 1.7–7.7)
Neutrophils Relative %: 81 %
Platelets: 167 10*3/uL (ref 150–400)
RBC: 3.81 MIL/uL — ABNORMAL LOW (ref 3.87–5.11)
RDW: 13.3 % (ref 11.5–15.5)
WBC: 8.4 10*3/uL (ref 4.0–10.5)
nRBC: 0 % (ref 0.0–0.2)

## 2020-08-17 LAB — URINALYSIS, ROUTINE W REFLEX MICROSCOPIC
Bilirubin Urine: NEGATIVE
Glucose, UA: NEGATIVE mg/dL
Hgb urine dipstick: NEGATIVE
Ketones, ur: NEGATIVE mg/dL
Nitrite: POSITIVE — AB
Protein, ur: NEGATIVE mg/dL
Specific Gravity, Urine: 1.008 (ref 1.005–1.030)
pH: 5 (ref 5.0–8.0)

## 2020-08-17 LAB — BASIC METABOLIC PANEL
Anion gap: 8 (ref 5–15)
BUN: 11 mg/dL (ref 8–23)
CO2: 22 mmol/L (ref 22–32)
Calcium: 9 mg/dL (ref 8.9–10.3)
Chloride: 103 mmol/L (ref 98–111)
Creatinine, Ser: 0.72 mg/dL (ref 0.44–1.00)
GFR, Estimated: >60 mL/min (ref >60)
Glucose, Bld: 86 mg/dL (ref 70–99)
Potassium: 4.8 mmol/L (ref 3.5–5.1)
Sodium: 133 mmol/L — ABNORMAL LOW (ref 135–145)

## 2020-08-17 LAB — PHENYTOIN LEVEL, TOTAL
Phenytoin Lvl: 13.6 ug/mL (ref 10.0–20.0)
Phenytoin Lvl: 21.9 ug/mL — ABNORMAL HIGH (ref 10.0–20.0)

## 2020-08-17 LAB — GLUCOSE, CAPILLARY
Glucose-Capillary: 81 mg/dL (ref 70–99)
Glucose-Capillary: 123 mg/dL — ABNORMAL HIGH (ref 70–99)
Glucose-Capillary: 202 mg/dL — ABNORMAL HIGH (ref 70–99)

## 2020-08-17 LAB — CBG MONITORING, ED: Glucose-Capillary: 102 mg/dL — ABNORMAL HIGH (ref 70–99)

## 2020-08-17 LAB — SARS CORONAVIRUS 2 (TAT 6-24 HRS): SARS Coronavirus 2: NEGATIVE

## 2020-08-17 MED ORDER — VERAPAMIL HCL ER 240 MG PO TBCR
240.0000 mg | EXTENDED_RELEASE_TABLET | Freq: Every day | ORAL | Status: DC
  Administered 2020-08-17 – 2020-08-21 (×5): 240 mg via ORAL
  Filled 2020-08-17 (×6): qty 1

## 2020-08-17 MED ORDER — DICLOFENAC SODIUM 1 % EX GEL
2.0000 g | Freq: Four times a day (QID) | CUTANEOUS | Status: DC | PRN
  Filled 2020-08-17: qty 100

## 2020-08-17 MED ORDER — GLUCOSE 40 % PO GEL: 1.0000 | Freq: Once | ORAL | Status: DC

## 2020-08-17 MED ORDER — PRIMIDONE 50 MG PO TABS
100.0000 mg | ORAL_TABLET | Freq: Two times a day (BID) | ORAL | Status: DC
  Administered 2020-08-17 – 2020-08-22 (×10): 100 mg via ORAL
  Filled 2020-08-17 (×13): qty 2

## 2020-08-17 MED ORDER — FENTANYL CITRATE (PF) 100 MCG/2ML IJ SOLN
25.0000 ug | INTRAMUSCULAR | Status: DC | PRN
  Administered 2020-08-17: 25 ug via INTRAVENOUS
  Filled 2020-08-17: qty 2

## 2020-08-17 MED ORDER — PANTOPRAZOLE SODIUM 40 MG PO TBEC
40.0000 mg | DELAYED_RELEASE_TABLET | Freq: Every day | ORAL | Status: DC
  Administered 2020-08-17 – 2020-08-22 (×6): 40 mg via ORAL
  Filled 2020-08-17 (×6): qty 1

## 2020-08-17 MED ORDER — LIDOCAINE 5 % EX PTCH
1.0000 | MEDICATED_PATCH | CUTANEOUS | Status: DC
  Administered 2020-08-17 – 2020-08-22 (×6): 1 via TRANSDERMAL
  Filled 2020-08-17 (×6): qty 1

## 2020-08-17 MED ORDER — ALPRAZOLAM 0.25 MG PO TABS
0.2500 mg | ORAL_TABLET | Freq: Two times a day (BID) | ORAL | Status: DC | PRN
  Administered 2020-08-17 – 2020-08-19 (×4): 0.25 mg via ORAL
  Filled 2020-08-17 (×4): qty 1

## 2020-08-17 MED ORDER — PHENYTOIN SODIUM EXTENDED 100 MG PO CAPS
100.0000 mg | ORAL_CAPSULE | Freq: Two times a day (BID) | ORAL | Status: DC
  Administered 2020-08-17 – 2020-08-18 (×2): 100 mg via ORAL
  Filled 2020-08-17 (×3): qty 1

## 2020-08-17 MED ORDER — INSULIN GLARGINE 100 UNIT/ML ~~LOC~~ SOLN
10.0000 [IU] | Freq: Every day | SUBCUTANEOUS | Status: DC
  Administered 2020-08-17 – 2020-08-21 (×5): 10 [IU] via SUBCUTANEOUS
  Filled 2020-08-17 (×6): qty 0.1

## 2020-08-17 MED ORDER — GLUCERNA SHAKE PO LIQD
237.0000 mL | ORAL | Status: DC
  Administered 2020-08-17 – 2020-08-19 (×3): 237 mL via ORAL

## 2020-08-17 MED ORDER — FOSFOMYCIN TROMETHAMINE 3 G PO PACK
3.0000 g | PACK | Freq: Once | ORAL | Status: AC
  Administered 2020-08-17: 3 g via ORAL
  Filled 2020-08-17: qty 3

## 2020-08-17 MED ORDER — TRANDOLAPRIL 1 MG PO TABS
2.0000 mg | ORAL_TABLET | Freq: Every day | ORAL | Status: DC
  Administered 2020-08-17 – 2020-08-22 (×6): 2 mg via ORAL
  Filled 2020-08-17 (×6): qty 2

## 2020-08-17 MED ORDER — VITAMIN D 25 MCG (1000 UNIT) PO TABS
5000.0000 [IU] | ORAL_TABLET | Freq: Every day | ORAL | Status: DC
  Administered 2020-08-17 – 2020-08-22 (×6): 5000 [IU] via ORAL
  Filled 2020-08-17 (×7): qty 5

## 2020-08-17 MED ORDER — TRANDOLAPRIL-VERAPAMIL HCL ER 2-240 MG PO TBCR: 1.0000 | EXTENDED_RELEASE_TABLET | Freq: Every day | ORAL | Status: DC

## 2020-08-17 MED ORDER — SODIUM CHLORIDE 0.9 % IV SOLN: INTRAVENOUS | Status: AC

## 2020-08-17 MED ORDER — FENTANYL CITRATE (PF) 100 MCG/2ML IJ SOLN
50.0000 ug | Freq: Once | INTRAMUSCULAR | Status: AC
  Administered 2020-08-17: 50 ug via INTRAVENOUS
  Filled 2020-08-17: qty 2

## 2020-08-17 MED ORDER — FENTANYL CITRATE (PF) 100 MCG/2ML IJ SOLN
25.0000 ug | INTRAMUSCULAR | Status: DC | PRN
  Administered 2020-08-17 (×3): 25 ug via INTRAVENOUS
  Filled 2020-08-17 (×3): qty 2

## 2020-08-17 MED ORDER — ENOXAPARIN SODIUM 40 MG/0.4ML ~~LOC~~ SOLN
40.0000 mg | SUBCUTANEOUS | Status: DC
  Administered 2020-08-17 – 2020-08-22 (×6): 40 mg via SUBCUTANEOUS
  Filled 2020-08-17 (×6): qty 0.4

## 2020-08-17 MED ORDER — POLYVINYL ALCOHOL 1.4 % OP SOLN: 2.0000 [drp] | Freq: Two times a day (BID) | OPHTHALMIC | Status: DC | PRN

## 2020-08-17 MED ORDER — PHENYTOIN SODIUM EXTENDED 100 MG PO CAPS: 100.0000 mg | ORAL_CAPSULE | Freq: Two times a day (BID) | ORAL | Status: DC

## 2020-08-17 MED ORDER — OXYCODONE-ACETAMINOPHEN 5-325 MG PO TABS
1.0000 | ORAL_TABLET | ORAL | Status: DC | PRN
  Administered 2020-08-17: 2 via ORAL
  Filled 2020-08-17: qty 2

## 2020-08-17 MED ORDER — LEVOTHYROXINE SODIUM 75 MCG PO TABS
75.0000 ug | ORAL_TABLET | Freq: Every day | ORAL | Status: DC
  Administered 2020-08-17 – 2020-08-22 (×6): 75 ug via ORAL
  Filled 2020-08-17 (×6): qty 1

## 2020-08-17 NOTE — Progress Notes (Signed)
MRI called for patient. She informed me she just came from the ED and she wants to go  MRI at a later time. MRI tech made aware

## 2020-08-17 NOTE — Progress Notes (Signed)
MEDICATION RELATED CONSULT NOTE - FOLLOW UP   Pharmacy Consult for Phenytoin Indication: h/o seizures  Allergies  Allergen Reactions  . Bystolic [Nebivolol Hcl] Other (See Comments)    "extreme weakness, heaviness in legs & arms, swelling in legs/arms/face, swollen abdomen, pain in bladder, feet pain, soreness in chest"  . Carbamazepine Other (See Comments)    Blood poisoning  Other reaction(s): Unknown  . Cholestyramine Other (See Comments)    "itching rash on stomach, bloated, nausea, vomiting, sleeplessness, extreme pain in arms"  . Hydrazine Yellow [Tartrazine] Other (See Comments)    "does not reduce high blood pressure, pain in arm, high pressure, felt like I was on verge of heart attack, really weak"  . Morphine Other (See Comments)    Feels morbid, weak, still in pain  . Niacin Palpitations    Fast heart beat Other reaction(s): Unknown  . Niaspan [Niacin Er] Palpitations and Other (See Comments)    "fast heart beat, high blood pressure"  . Norvasc [Amlodipine Besylate] Other (See Comments)    "extreme fluid retention/pain)  . Optivar [Azelastine Hcl] Photosensitivity  . Repaglinide Hives  . Sular [Nisoldipine Er] Other (See Comments)    "severe headaches, swelling eyes, hands, feet, shortness of breath, weak, flushed face, brain boiling, fluid retention, high blood sugar, nervous, heart fast beating"  . Telmisartan Other (See Comments)    "headache, difficulty urinating, high blood sugar, fluid retention"  . Amoxicillin-Pot Clavulanate Rash and Other (See Comments)  . Cefdinir Swelling    Vaginal irritation, breathing,   . Ciprofloxacin Hcl Hives  . Clonidine Other (See Comments)    Dry mouth, fluid retention  . Clonidine Hydrochloride Other (See Comments)    Dry mouth, fluid retention  . Codeine Nausea And Vomiting  . Ezetimibe Other (See Comments)    Made weak Other reaction(s): Unknown  . Hydroxychloroquine Other (See Comments)    Low platlets  . Naproxen  Other (See Comments)    Shrinks bladder  . Sulfa Antibiotics Rash  . Ziac [Bisoprolol-Hydrochlorothiazide] Other (See Comments)    "stopped urination"  . Amlodipine Besy-Benazepril Hcl     Other reaction(s): cough  . Azelastine Other (See Comments)    Unknown reaction - Per MAR  . Elavil [Amitriptyline] Other (See Comments)    Gave Pt nightmares  . Empagliflozin Other (See Comments)    "Caused yeast infection, slowed my urine"  . Hydralazine Other (See Comments)    Unknown reaction - Per MAR  . Iodine I 131 Tositumomab Other (See Comments)    Unknown reaction - Per MAR  . Kenalog [Triamcinolone] Diarrhea    Per MAR  . Keppra [Levetiracetam] Other (See Comments)    Shaking  . Lamotrigine Itching and Other (See Comments)  . Norvasc [Amlodipine] Other (See Comments)    Unknown reaction - Per MAR  . Other Other (See Comments)    Other reaction(s): Unknown Other reaction(s): Unknown Other reaction(s): Unknown Other reaction(s): Unknown Other reaction(s): Unknown  . Pregabalin Swelling    Other reaction(s): weight gain  . Pseudoephedrine   . Pseudoephedrine-Guaifenesin Er Other (See Comments)    Unknown reaction - Per MAR  . Risedronate     Unknown reaction - Per MAR  . Ru-Hist D [Brompheniramine-Phenylephrine] Other (See Comments)    Unknown reaction - Per MAR  . Sumatriptan Other (See Comments)  . Topiramate Other (See Comments)    Dry eyes Other reaction(s): Unknown  . Ace Inhibitors Other (See Comments)    unknown  . Actonel [  Risedronate Sodium] Other (See Comments)    unknown  . Amlodipine Besylate Other (See Comments)    unknown  . Aspirin Other (See Comments)    unknown  . Atacand [Candesartan] Other (See Comments)    Unknown reaction - MAR  . Bextra [Valdecoxib] Other (See Comments)    Unknown reaction - Per MAR  . Bisoprolol-Hydrochlorothiazide Other (See Comments)    Unknown reaction - Per MAR  . Candesartan Cilexetil Other (See Comments)    unknown  .  Cefadroxil Other (See Comments)    unknown  . Celecoxib Rash  . Hydrocodone Other (See Comments)    unknown  . Hydrocodone-Acetaminophen Other (See Comments)    unknown  . Iodinated Diagnostic Agents Rash    "All over"  Other reaction(s): Unknown  . Meloxicam Other (See Comments)    unknown  . Methylprednisolone Sodium Succinate Other (See Comments)    unknown  . Nabumetone Other (See Comments)    Unknown reaction  . Penicillins Other (See Comments)    unknown  . Pseudoephedrine-Guaifenesin Other (See Comments)    unknown  . Risedronate Sodium Other (See Comments)    unknown  . Rofecoxib Other (See Comments)    Unknown reaction - MAR Other reaction(s): Unknown  . Ru-Tuss [Chlorphen-Pse-Atrop-Hyos-Scop] Other (See Comments)    unknown  . Sulfonamide Derivatives Other (See Comments)    unknown  . Sulphur [Elemental Sulfur] Other (See Comments)    unknown  . Telithromycin Other (See Comments)    unknown  . Terfenadine Other (See Comments)    Unknown reaction - Per MAR  . Trandolapril-Verapamil Hcl Er Other (See Comments)    Headache, difficulty urinating, high blood sugar, fluid retention  Pt is taking Tarka (trandolapril-verapamil) currently, but requests the medication stay in her allergy list  . Trandolapril-Verapamil Hcl Er Other (See Comments)    Headache, difficulty urinating, high blood sugar, fluid retention  Pt is taking Tarka (trandolapril-verapamil) currently, but requests the medication stay in her allergy list  . Valium [Diazepam] Other (See Comments)    Makes her mean and hyper    Patient Measurements: Height: 5\' 7"  (170.2 cm) Weight: 61.5 kg (135 lb 9.3 oz) IBW/kg (Calculated) : 61.6  Vital Signs: Temp: 98.8 F (37.1 C) (04/16 2252) Temp Source: Oral (04/16 2252) BP: 110/65 (04/16 2252) Pulse Rate: 81 (04/16 2252) Intake/Output from previous day: 04/15 0701 - 04/16 0700 In: 139.7 [P.O.:120; I.V.:19.7] Out: -  Intake/Output from this  shift: Total I/O In: -  Out: 500 [Urine:500]  Labs: Recent Labs    08/17/20 0005 08/17/20 0502  WBC 8.4 7.1  HGB 12.5 12.0  HCT 36.9 35.8*  PLT 167 251  CREATININE 0.81 0.72  ALBUMIN 3.9  --   PROT 6.3*  --   AST 26  --   ALT 23  --   ALKPHOS 95  --   BILITOT 0.8  --    Estimated Creatinine Clearance: 47.2 mL/min (by C-G formula based on SCr of 0.72 mg/dL).   Microbiology: Recent Results (from the past 720 hour(s))  SARS CORONAVIRUS 2 (TAT 6-24 HRS) Nasopharyngeal Nasopharyngeal Swab     Status: None   Collection Time: 08/17/20  4:57 AM   Specimen: Nasopharyngeal Swab  Result Value Ref Range Status   SARS Coronavirus 2 NEGATIVE NEGATIVE Final    Comment: (NOTE) SARS-CoV-2 target nucleic acids are NOT DETECTED.  The SARS-CoV-2 RNA is generally detectable in upper and lower respiratory specimens during the acute phase of infection. Negative results  do not preclude SARS-CoV-2 infection, do not rule out co-infections with other pathogens, and should not be used as the sole basis for treatment or other patient management decisions. Negative results must be combined with clinical observations, patient history, and epidemiological information. The expected result is Negative.  Fact Sheet for Patients: SugarRoll.be  Fact Sheet for Healthcare Providers: https://www.woods-mathews.com/  This test is not yet approved or cleared by the [place] FDA and  has been authorized for detection and/or diagnosis of SARS-CoV-2 by FDA under an Emergency Use Authorization (EUA). This EUA will remain  in effect (meaning this test can be used) for the duration of the COVID-19 declaration under Se ction 564(b)(1) of the Act, 21 U.S.C. section 360bbb-3(b)(1), unless the authorization is terminated or revoked sooner.  Performed at [facility], [place] [address], [place], [place] [address]     Medications:  See electronic med rec    Assessment: 87 y.o. F presents s/p fall. Pt on phenytoin 100mg  in the morning and 150mg  in the evening PTA for h/o seizures. Also on primidone 100mg  BID. Phenytoin level 21.9 (supratherapeutic), albumin 3.9 on admission. Question whether falls could be related to phenytoin toxicity. No active seizures  Phenytoin level now down to 13.9 mcg/ml (in therapeutic range).  Goal of Therapy:  Phenytoin level 10-20 mcg/ml; no seizures  Plan:  Phenytoin 100mg  BID Will f/u phenytoin levels in ~5 days  Sherlon Handing, PharmD, BCPS Please see amion for complete clinical pharmacist phone list 08/17/2020,11:34 PM

## 2020-08-17 NOTE — Progress Notes (Signed)
PT Cancellation Note  Patient Details Name: Nesiah Jump Wohlford MRN: [id number] DOB: 04-30-1930   Cancelled Treatment:    Reason Eval/Treat Not Completed: Medical issues which prohibited therapy per imaging/MRI report, there is some concern in C3-C4 region of possible interspinous ligament injury vs degeneration- PT directly messaged MD in epic secure chat regarding finding prior to starting evaluation. Patient also currently in 9/10 pain. If she is medically clear to participate by MD, will attempt to return later if time/schedule allow.    Windell Norfolk, DPT, PN1   Supplemental Physical Therapist [facility]    Pager [phone] Acute Rehab Office [phone]

## 2020-08-17 NOTE — ED Notes (Signed)
CBG 102 

## 2020-08-17 NOTE — Progress Notes (Addendum)
MEDICATION RELATED CONSULT NOTE - FOLLOW UP   Pharmacy Consult for Phenytoin Indication: h/o seizures  Allergies  Allergen Reactions  . Bystolic [Nebivolol Hcl] Other (See Comments)    "extreme weakness, heaviness in legs & arms, swelling in legs/arms/face, swollen abdomen, pain in bladder, feet pain, soreness in chest"  . Carbamazepine Other (See Comments)    Blood poisoning  Other reaction(s): Unknown  . Cholestyramine Other (See Comments)    "itching rash on stomach, bloated, nausea, vomiting, sleeplessness, extreme pain in arms"  . Hydrazine Yellow [Tartrazine] Other (See Comments)    "does not reduce high blood pressure, pain in arm, high pressure, felt like I was on verge of heart attack, really weak"  . Morphine Other (See Comments)    Feels morbid, weak, still in pain  . Niacin Palpitations    Fast heart beat Other reaction(s): Unknown  . Niaspan [Niacin Er] Palpitations and Other (See Comments)    "fast heart beat, high blood pressure"  . Norvasc [Amlodipine Besylate] Other (See Comments)    "extreme fluid retention/pain)  . Optivar [Azelastine Hcl] Photosensitivity  . Repaglinide Hives  . Sular [Nisoldipine Er] Other (See Comments)    "severe headaches, swelling eyes, hands, feet, shortness of breath, weak, flushed face, brain boiling, fluid retention, high blood sugar, nervous, heart fast beating"  . Telmisartan Other (See Comments)    "headache, difficulty urinating, high blood sugar, fluid retention"  . Amoxicillin-Pot Clavulanate Rash and Other (See Comments)  . Cefdinir Swelling    Vaginal irritation, breathing,   . Ciprofloxacin Hcl Hives  . Clonidine Other (See Comments)    Dry mouth, fluid retention  . Clonidine Hydrochloride Other (See Comments)    Dry mouth, fluid retention  . Codeine Nausea And Vomiting  . Ezetimibe Other (See Comments)    Made weak Other reaction(s): Unknown  . Hydroxychloroquine Other (See Comments)    Low platlets  . Naproxen  Other (See Comments)    Shrinks bladder  . Sulfa Antibiotics Rash  . Ziac [Bisoprolol-Hydrochlorothiazide] Other (See Comments)    "stopped urination"  . Amlodipine Besy-Benazepril Hcl     Other reaction(s): cough  . Azelastine Other (See Comments)    Unknown reaction - Per MAR  . Elavil [Amitriptyline] Other (See Comments)    Gave Pt nightmares  . Empagliflozin Other (See Comments)    "Caused yeast infection, slowed my urine"  . Hydralazine Other (See Comments)    Unknown reaction - Per MAR  . Iodine I 131 Tositumomab Other (See Comments)    Unknown reaction - Per MAR  . Kenalog [Triamcinolone] Diarrhea    Per MAR  . Keppra [Levetiracetam] Other (See Comments)    Shaking  . Lamotrigine Itching and Other (See Comments)  . Norvasc [Amlodipine] Other (See Comments)    Unknown reaction - Per MAR  . Other Other (See Comments)    Other reaction(s): Unknown Other reaction(s): Unknown Other reaction(s): Unknown Other reaction(s): Unknown Other reaction(s): Unknown  . Pregabalin Swelling    Other reaction(s): weight gain  . Pseudoephedrine   . Pseudoephedrine-Guaifenesin Er Other (See Comments)    Unknown reaction - Per MAR  . Risedronate     Unknown reaction - Per MAR  . Ru-Hist D [Brompheniramine-Phenylephrine] Other (See Comments)    Unknown reaction - Per MAR  . Sumatriptan Other (See Comments)  . Topiramate Other (See Comments)    Dry eyes Other reaction(s): Unknown  . Ace Inhibitors Other (See Comments)    unknown  . Actonel [  Risedronate Sodium] Other (See Comments)    unknown  . Amlodipine Besylate Other (See Comments)    unknown  . Aspirin Other (See Comments)    unknown  . Atacand [Candesartan] Other (See Comments)    Unknown reaction - MAR  . Bextra [Valdecoxib] Other (See Comments)    Unknown reaction - Per MAR  . Bisoprolol-Hydrochlorothiazide Other (See Comments)    Unknown reaction - Per MAR  . Candesartan Cilexetil Other (See Comments)    unknown  .  Cefadroxil Other (See Comments)    unknown  . Celecoxib Rash  . Hydrocodone Other (See Comments)    unknown  . Hydrocodone-Acetaminophen Other (See Comments)    unknown  . Iodinated Diagnostic Agents Rash    "All over"  Other reaction(s): Unknown  . Meloxicam Other (See Comments)    unknown  . Methylprednisolone Sodium Succinate Other (See Comments)    unknown  . Nabumetone Other (See Comments)    Unknown reaction  . Penicillins Other (See Comments)    unknown  . Pseudoephedrine-Guaifenesin Other (See Comments)    unknown  . Risedronate Sodium Other (See Comments)    unknown  . Rofecoxib Other (See Comments)    Unknown reaction - MAR Other reaction(s): Unknown  . Ru-Tuss [Chlorphen-Pse-Atrop-Hyos-Scop] Other (See Comments)    unknown  . Sulfonamide Derivatives Other (See Comments)    unknown  . Sulphur [Elemental Sulfur] Other (See Comments)    unknown  . Telithromycin Other (See Comments)    unknown  . Terfenadine Other (See Comments)    Unknown reaction - Per MAR  . Trandolapril-Verapamil Hcl Er Other (See Comments)    Headache, difficulty urinating, high blood sugar, fluid retention  Pt is taking Tarka (trandolapril-verapamil) currently, but requests the medication stay in her allergy list  . Trandolapril-Verapamil Hcl Er Other (See Comments)    Headache, difficulty urinating, high blood sugar, fluid retention  Pt is taking Tarka (trandolapril-verapamil) currently, but requests the medication stay in her allergy list  . Valium [Diazepam] Other (See Comments)    Makes her mean and hyper    Patient Measurements: Height: 5\' 7"  (170.2 cm) Weight: 62.1 kg (137 lb) IBW/kg (Calculated) : 61.6  Vital Signs: Temp: 100.1 F (37.8 C) (04/15 2205) Temp Source: Oral (04/15 2205) BP: 124/59 (04/16 0237) Pulse Rate: 80 (04/16 0237) Intake/Output from previous day: No intake/output data recorded. Intake/Output from this shift: No intake/output data  recorded.  Labs: Recent Labs    08/17/20 0005  WBC 8.4  HGB 12.5  HCT 36.9  PLT 167  CREATININE 0.81  ALBUMIN 3.9  PROT 6.3*  AST 26  ALT 23  ALKPHOS 95  BILITOT 0.8   Estimated Creatinine Clearance: 46.7 mL/min (by C-G formula based on SCr of 0.81 mg/dL).   Microbiology: No results found for this or any previous visit (from the past 720 hour(s)).  Medications:  See electronic med rec   Assessment: 87 y.o. F presents s/p fall. Pt on phenytoin 100mg  in the morning and 150mg  in the evening PTA for h/o seizures. Also on primidone 100mg  BID. Phenytoin level 21.9 (supratherapeutic), albumin 3.9. Question whether falls could be related to phenytoin toxicity.  No active seizures  Goal of Therapy:  Phenytoin level 10-20 mcg/ml; no seizures  Plan:  Phenytoin level 4/16 2200 Further dosing when phenytoin level falls below 20 mcg/ml  Sherlon Handing, PharmD, BCPS Please see amion for complete clinical pharmacist phone list 08/17/2020,3:45 AM

## 2020-08-17 NOTE — Progress Notes (Signed)
PT Cancellation Note  Patient Details Name: Jennier Schissler Burkhammer MRN: [id number] DOB: 02/12/1930   Cancelled Treatment:    Reason Eval/Treat Not Completed: Patient at procedure or test/unavailable pt just now leaving unit for MRI. Will attempt to return if time/schedule allow.    Windell Norfolk, DPT, PN1   Supplemental Physical Therapist [facility]    Pager [phone] Acute Rehab Office [phone]

## 2020-08-17 NOTE — Progress Notes (Signed)
Patient reported that her pink robe is missing. Writer called ED to notify them and they were already aware and have been looking. Spoke with Butch Penny, who said she would check their soiled utility room. Patient made aware.

## 2020-08-17 NOTE — ED Notes (Signed)
RN attempted to call report x1 

## 2020-08-17 NOTE — H&P (Addendum)
History and Physical    [name] TTS:[id number] DOB: [birth year] DOA: 08/16/2020  PCP: Bartholome Bill, MD   Patient coming from: Home.  Chief Complaint: Fall.  HPI: [name] is a 87 y.o. female with history of seizure disorder, diabetes mellitus, hypothyroidism, hypertension, depression was admitted 2 months ago after patient had a fall had underwent right hip surgery was brought to the ER after patient had a fall at home.  Patient states she has been having at least 4 falls over the last 1 week at home.  Per report patient alerted life help button and EMS arrived at home and was found on the floor.  Patient states she has not lost consciousness did not have any chest pain or shortness of breath.  She has been hurting all over particularly in the back.  ED Course: In the ER patient had underwent CT head C-spine x-rays of the lumbar spine pelvis ankle chest.  All of the chest shows nothing acute except for possible new T5 compression fracture.  In addition UA is concerning for UTI with patient having mild fever.  Labs are significant for potassium of 5.3 CK is normal phenytoin level is 21.9 and EKG shows normal sinus rhythm.  Covid test is pending.  Patient admitted for further management of falls with Dilantin toxicity and possible UTI.  Review of Systems: As per HPI, rest all negative.   Past Medical History:  Diagnosis Date  . Anemia   . Anxiety   . Asthma   . Breast cancer ([place]) 2014   right breast  . Cancer of right breast ([place]) 12/26/12   right breast 12:00 o'clock, DCIS  . Carotid artery disease ([place])   . Carpal tunnel syndrome, bilateral   . Chronic bronchitis ([place])   . Chronic cough   . Chronic facial pain   . Chronic foot pain   . Colon cancer ([place]) dx'd 11/2019  . Complication of anesthesia    Sore jaw; could not chew or move mouth, prolonged sedation  . Convulsions/seizures ([place]) 10/16/2014  . Diabetes mellitus    type 2 niddm x 20 years  . Dyslipidemia   .  Ejection fraction   . Gait abnormality 12/04/2019  . GERD (gastroesophageal reflux disease)   . Hammer toe    bilateral  . History of colonic polyps   . History of meningioma   . HTN (hypertension)   . Hx of radiation therapy 03/07/13- 03/29/13   right breast 4250 cGy 17 sessions  . Hyperlipidemia   . Hypokalemia   . Hyponatremia   . Hypothyroidism   . IBS (irritable bowel syndrome)   . Melanoma ([place])   . Metatarsal bone fracture 2014  . Multiple drug allergies   . Nontoxic thyroid nodule   . Obesity   . Osteoarthritis   . Osteoporosis   . Palpitations   . Personal history of radiation therapy 2014  . Seasonal allergies   . Skin cancer   . Syncope   . Tremor, essential 08/18/2016  . Vitamin B12 deficiency   . Vitamin D deficiency     Past Surgical History:  Procedure Laterality Date  . ABDOMINAL HYSTERECTOMY    . BIOPSY  11/07/2019   Procedure: BIOPSY;  Surgeon: Ronald Lobo, MD;  Location: [facility];  Service: Endoscopy;;  . BRAIN SURGERY    . BREAST BIOPSY Right 01/24/2013   Procedure: RE-EXCICION OF BREAST CANCER, ANTERIOR MARGINS;  Surgeon: Edward Jolly, MD;  Location: [facility];  Service: General;  Laterality: Right;  . BREAST LUMPECTOMY Right 2014  . BREAST LUMPECTOMY WITH NEEDLE LOCALIZATION Right 01/17/2013   Procedure: BREAST LUMPECTOMY WITH NEEDLE LOCALIZATION;  Surgeon: Edward Jolly, MD;  Location: [place];  Service: General;  Laterality: Right;  . BUNIONECTOMY Bilateral   . CATARACT EXTRACTION W/ INTRAOCULAR LENS IMPLANT Right   . CHOLECYSTECTOMY    . COLONOSCOPY    . COLONOSCOPY WITH PROPOFOL N/A 11/07/2019   Procedure: COLONOSCOPY WITH PROPOFOL;  Surgeon: Ronald Lobo, MD;  Location: [facility];  Service: Endoscopy;  Laterality: N/A;  . CRANIOTOMY Right 10/18/2013   Procedure: CRANIOTOMY TUMOR EXCISION;  Surgeon: Floyce Stakes, MD;  Location: [facility];  Service: Neurosurgery;  Laterality: Right;  . ESOPHAGOGASTRODUODENOSCOPY (EGD) WITH  PROPOFOL N/A 11/07/2019   Procedure: ESOPHAGOGASTRODUODENOSCOPY (EGD) WITH PROPOFOL;  Surgeon: Ronald Lobo, MD;  Location: [facility];  Service: Endoscopy;  Laterality: N/A;  . EYE SURGERY    . HERNIA REPAIR    . IR THORACENTESIS ASP PLEURAL SPACE W/IMG GUIDE  04/01/2020  . KNEE ARTHROSCOPY Bilateral   . LAPAROSCOPIC RIGHT HEMI COLECTOMY Right 03/06/2020   Procedure: LAPAROSCOPIC RIGHT HEMI COLECTOMY WITH TAP BLOCK AND LYSIS OF ADHESIONS;  Surgeon: Ileana Roup, MD;  Location: [facility];  Service: General;  Laterality: Right;  . POLYPECTOMY     small adenomatous  . TOTAL HIP ARTHROPLASTY Right 06/05/2020   Procedure: ARTHROPLASTY  ANTERIOR APPROACH. RIGHT HIP;  Surgeon: Rod Can, MD;  Location: [facility];  Service: Orthopedics;  Laterality: Right;  . ULNAR TUNNEL RELEASE       reports that she has never smoked. She has never used smokeless tobacco. She reports that she does not drink alcohol and does not use drugs.  Allergies  Allergen Reactions  . Bystolic [Nebivolol Hcl] Other (See Comments)    "extreme weakness, heaviness in legs & arms, swelling in legs/arms/face, swollen abdomen, pain in bladder, feet pain, soreness in chest"  . Carbamazepine Other (See Comments)    Blood poisoning  Other reaction(s): Unknown  . Cholestyramine Other (See Comments)    "itching rash on stomach, bloated, nausea, vomiting, sleeplessness, extreme pain in arms"  . Hydrazine Yellow [Tartrazine] Other (See Comments)    "does not reduce high blood pressure, pain in arm, high pressure, felt like I was on verge of heart attack, really weak"  . Morphine Other (See Comments)    Feels morbid, weak, still in pain  . Niacin Palpitations    Fast heart beat Other reaction(s): Unknown  . Niaspan [Niacin Er] Palpitations and Other (See Comments)    "fast heart beat, high blood pressure"  . Norvasc [Amlodipine Besylate] Other (See Comments)    "extreme fluid retention/pain)  . Optivar [Azelastine Hcl]  Photosensitivity  . Repaglinide Hives  . Sular [Nisoldipine Er] Other (See Comments)    "severe headaches, swelling eyes, hands, feet, shortness of breath, weak, flushed face, brain boiling, fluid retention, high blood sugar, nervous, heart fast beating"  . Telmisartan Other (See Comments)    "headache, difficulty urinating, high blood sugar, fluid retention"  . Amoxicillin-Pot Clavulanate Rash and Other (See Comments)  . Cefdinir Swelling    Vaginal irritation, breathing,   . Ciprofloxacin Hcl Hives  . Clonidine Other (See Comments)    Dry mouth, fluid retention  . Clonidine Hydrochloride Other (See Comments)    Dry mouth, fluid retention  . Codeine Nausea And Vomiting  . Ezetimibe Other (See Comments)    Made weak Other reaction(s): Unknown  .  Hydroxychloroquine Other (See Comments)    Low platlets  . Naproxen Other (See Comments)    Shrinks bladder  . Sulfa Antibiotics Rash  . Ziac [Bisoprolol-Hydrochlorothiazide] Other (See Comments)    "stopped urination"  . Amlodipine Besy-Benazepril Hcl     Other reaction(s): cough  . Azelastine Other (See Comments)    Unknown reaction - Per MAR  . Elavil [Amitriptyline] Other (See Comments)    Gave Pt nightmares  . Empagliflozin Other (See Comments)    "Caused yeast infection, slowed my urine"  . Hydralazine Other (See Comments)    Unknown reaction - Per MAR  . Iodine I 131 Tositumomab Other (See Comments)    Unknown reaction - Per MAR  . Kenalog [Triamcinolone] Diarrhea    Per MAR  . Keppra [Levetiracetam] Other (See Comments)    Shaking  . Lamotrigine Itching and Other (See Comments)  . Norvasc [Amlodipine] Other (See Comments)    Unknown reaction - Per MAR  . Other Other (See Comments)    Other reaction(s): Unknown Other reaction(s): Unknown Other reaction(s): Unknown Other reaction(s): Unknown Other reaction(s): Unknown  . Pregabalin Swelling    Other reaction(s): weight gain  . Pseudoephedrine   .  Pseudoephedrine-Guaifenesin Er Other (See Comments)    Unknown reaction - Per MAR  . Risedronate     Unknown reaction - Per MAR  . Ru-Hist D [Brompheniramine-Phenylephrine] Other (See Comments)    Unknown reaction - Per MAR  . Sumatriptan Other (See Comments)  . Topiramate Other (See Comments)    Dry eyes Other reaction(s): Unknown  . Ace Inhibitors Other (See Comments)    unknown  . Actonel [Risedronate Sodium] Other (See Comments)    unknown  . Amlodipine Besylate Other (See Comments)    unknown  . Aspirin Other (See Comments)    unknown  . Atacand [Candesartan] Other (See Comments)    Unknown reaction - MAR  . Bextra [Valdecoxib] Other (See Comments)    Unknown reaction - Per MAR  . Bisoprolol-Hydrochlorothiazide Other (See Comments)    Unknown reaction - Per MAR  . Candesartan Cilexetil Other (See Comments)    unknown  . Cefadroxil Other (See Comments)    unknown  . Celecoxib Rash  . Hydrocodone Other (See Comments)    unknown  . Hydrocodone-Acetaminophen Other (See Comments)    unknown  . Iodinated Diagnostic Agents Rash    "All over"  Other reaction(s): Unknown  . Meloxicam Other (See Comments)    unknown  . Methylprednisolone Sodium Succinate Other (See Comments)    unknown  . Nabumetone Other (See Comments)    Unknown reaction  . Penicillins Other (See Comments)    unknown  . Pseudoephedrine-Guaifenesin Other (See Comments)    unknown  . Risedronate Sodium Other (See Comments)    unknown  . Rofecoxib Other (See Comments)    Unknown reaction - MAR Other reaction(s): Unknown  . Ru-Tuss [Chlorphen-Pse-Atrop-Hyos-Scop] Other (See Comments)    unknown  . Sulfonamide Derivatives Other (See Comments)    unknown  . Sulphur [Elemental Sulfur] Other (See Comments)    unknown  . Telithromycin Other (See Comments)    unknown  . Terfenadine Other (See Comments)    Unknown reaction - Per MAR  . Trandolapril-Verapamil Hcl Er Other (See Comments)    Headache,  difficulty urinating, high blood sugar, fluid retention  Pt is taking Tarka (trandolapril-verapamil) currently, but requests the medication stay in her allergy list  . Trandolapril-Verapamil Hcl Er Other (See Comments)  Headache, difficulty urinating, high blood sugar, fluid retention  Pt is taking Tarka (trandolapril-verapamil) currently, but requests the medication stay in her allergy list  . Valium [Diazepam] Other (See Comments)    Makes her mean and hyper    Family History  Problem Relation Age of Onset  . Heart disease Mother   . Osteoporosis Mother   . Diabetes Father   . Pancreatic cancer Father   . Colon cancer Other   . Bone cancer Sister   . Prostate cancer Brother   . Colon cancer Brother   . Rectal cancer Sister   . Thyroid disease Sister        benign goiter resected    Prior to Admission medications   Medication Sig Start Date End Date Taking? Authorizing Provider  acetaminophen (TYLENOL) 500 MG tablet Take 1,000 mg by mouth every 8 (eight) hours as needed for mild pain or headache.   Yes [provider]  ALPRAZolam (XANAX) 0.25 MG tablet Take 0.25 mg by mouth 2 (two) times daily as needed for anxiety or sleep. 05/30/19  Yes [provider]  Blood Glucose Monitoring Suppl (PRODIGY VOICE BLOOD GLUCOSE) w/Device KIT Use to check blood sugar 1 time per day. 10/18/15  Yes Renato Shin, MD  Cholecalciferol (VITAMIN D-3) 125 MCG (5000 UT) TABS Take 5,000 Units by mouth daily.   Yes [provider]  cholestyramine (QUESTRAN) 4 g packet Take 4 g by mouth 2 (two) times daily.   Yes [provider]  diclofenac Sodium (VOLTAREN) 1 % GEL Apply 2 g topically 4 (four) times daily as needed (pain). knees 06/01/19  Yes [provider]  feeding supplement, GLUCERNA SHAKE, (GLUCERNA SHAKE) LIQD Take 237 mLs by mouth daily.   Yes [provider]  glucose blood (GLUCOSE METER TEST) test strip See admin instructions.   Yes [provider]  Glucose Blood (PRODIGY VOICE BLOOD GLUCOSE VI)    Yes [provider]  insulin aspart (NOVOLOG) 100 UNIT/ML FlexPen Inject 2-12 Units into the skin See admin instructions. Injects 2-10 units of insulin under the skin 3 times a day per sliding scale: CBG 0-150: 2 units; CBG 151-200: 4 units; CBG 201-250: 6 units; CBG 251-300: 8 units; CBG 301-350: 10 units; CBG 351-400: 12 units; CBG >400: 12 units and notify the MD/NP 07/08/20  Yes Fargo, Amy E, NP  insulin glargine (LANTUS SOLOSTAR) 100 UNIT/ML Solostar Pen Inject 14 Units into the skin at bedtime. Patient taking differently: Inject 10 Units into the skin at bedtime. 07/08/20  Yes Fargo, Amy E, NP  Insulin Pen Needle 32G X 4 MM MISC Used to inject insulin 3x daily 11/19/16  Yes Renato Shin, MD  levothyroxine (SYNTHROID) 75 MCG tablet Take 1 tablet (75 mcg total) by mouth daily before breakfast. 07/08/20  Yes Fargo, Amy E, NP  lidocaine (LIDODERM) 5 % Place 1 patch onto the skin daily. Remove & Discard patch within 12 hours or as directed by MD 07/08/20  Yes Fargo, Amy E, NP  ondansetron (ZOFRAN-ODT) 4 MG disintegrating tablet Take 1 tablet (4 mg total) by mouth every 6 (six) hours as needed for nausea. 03/19/20  Yes Ileana Roup, MD  pantoprazole (PROTONIX) 40 MG tablet Take 1 tablet (40 mg total) by mouth daily at 6 (six) AM. 07/08/20  Yes Fargo, Amy E, NP  phenytoin (DILANTIN) 100 MG ER capsule Take 1 capsule (100 mg total) by mouth daily. Patient taking differently: Take 100 mg by mouth 2 (two) times  daily. 07/08/20  Yes Fargo, Amy E, NP  phenytoin (DILANTIN) 50 MG tablet Chew 1 tablet (50 mg total) by mouth daily. Take with 19m capsule for a total of 1525mevery morning. 07/08/20  Yes Fargo, Amy E, NP  Polyethyl Glycol-Propyl Glycol (SYSTANE) 0.4-0.3 % SOLN Place 2 drops into both eyes every 12 (twelve) hours as needed (dry eyes).   Yes [provider]  primidone (MYSOLINE) 50 MG tablet Take 2 tablets (100 mg total)  by mouth in the morning and at bedtime. 08/07/20  Yes WiKathrynn DuckingMD  traMADol (ULTRAM) 50 MG tablet Take 50 mg by mouth daily as needed for moderate pain.   Yes [provider]  trandolapril-verapamil (TARKA) 2-240 MG tablet Take 1 tablet by mouth at bedtime. 07/08/20  Yes Fargo, Amy E, NP  escitalopram (LEXAPRO) 10 MG tablet Take 1 tablet (10 mg total) by mouth daily. Patient not taking: No sig reported 07/08/20   FaWindell Moulding, NP  furosemide (LASIX) 20 MG tablet Take 1 tablet (20 mg total) by mouth daily. Patient not taking: No sig reported 07/08/20   FaWindell Moulding, NP  loperamide (IMODIUM) 2 MG capsule Take 4 mg by mouth 2 (two) times daily. For Chronic Diarrhea Patient not taking: No sig reported    [provider]  senna-docusate (SENOKOT-S) 8.6-50 MG tablet Take 1 tablet by mouth 2 (two) times daily. Patient not taking: No sig reported 06/10/20   ThEugenie FillerMD    Physical Exam: Constitutional: Moderately built and nourished. Vitals:   08/17/20 0000 08/17/20 0106 08/17/20 0143 08/17/20 0237  BP: (!) 156/63 (!) 144/65 (!) 141/63 (!) 124/59  Pulse: 65 84 89 80  Resp: 19 15 (!) 26 (!) 21  Temp:      TempSrc:      SpO2: 100% 97% 93% 97%  Weight:      Height:       Eyes: Anicteric no pallor. ENMT: No discharge from the ears eyes nose or mouth. Neck: No mass felt.  No neck rigidity. Respiratory: No rhonchi or crepitations. Cardiovascular: S1-S2 heard. Abdomen: Soft nontender bowel sounds present. Musculoskeletal: No edema. Skin: No rash. Neurologic: Alert awake oriented time place and person.  Moves all extremities with restricted with pain. Psychiatric: Appears normal.  Normal affect.   Labs on Admission: I have personally reviewed following labs and imaging studies  CBC: Recent Labs  Lab 08/17/20 0005  WBC 8.4  NEUTROABS 6.9  HGB 12.5  HCT 36.9  MCV 96.9  PLT 16161 Basic Metabolic Panel: Recent Labs  Lab 08/17/20 0005  NA 132*  K 5.3*   CL 104  CO2 20*  GLUCOSE 83  BUN 12  CREATININE 0.81  CALCIUM 9.2   GFR: Estimated Creatinine Clearance: 46.7 mL/min (by C-G formula based on SCr of 0.81 mg/dL). Liver Function Tests: Recent Labs  Lab 08/17/20 0005  AST 26  ALT 23  ALKPHOS 95  BILITOT 0.8  PROT 6.3*  ALBUMIN 3.9   No results for input(s): LIPASE, AMYLASE in the last 168 hours. No results for input(s): AMMONIA in the last 168 hours. Coagulation Profile: No results for input(s): INR, PROTIME in the last 168 hours. Cardiac Enzymes: Recent Labs  Lab 08/17/20 0005  CKTOTAL 55   BNP (last 3 results) No results for input(s): PROBNP in the last 8760 hours. HbA1C: No results for input(s): HGBA1C in the last 72 hours. CBG: Recent Labs  Lab 08/16/20 2353 08/17/20 0207  GLUCAP  55* 102*   Lipid Profile: No results for input(s): CHOL, HDL, LDLCALC, TRIG, CHOLHDL, LDLDIRECT in the last 72 hours. Thyroid Function Tests: No results for input(s): TSH, T4TOTAL, FREET4, T3FREE, THYROIDAB in the last 72 hours. Anemia Panel: No results for input(s): VITAMINB12, FOLATE, FERRITIN, TIBC, IRON, RETICCTPCT in the last 72 hours. Urine analysis:    Component Value Date/Time   COLORURINE YELLOW 08/17/2020 0242   APPEARANCEUR CLEAR 08/17/2020 0242   LABSPEC 1.008 08/17/2020 0242   PHURINE 5.0 08/17/2020 0242   GLUCOSEU NEGATIVE 08/17/2020 0242   GLUCOSEU NEGATIVE 12/28/2013 1036   HGBUR NEGATIVE 08/17/2020 0242   HGBUR trace-intact 06/29/2007 1304   BILIRUBINUR NEGATIVE 08/17/2020 0242   BILIRUBINUR negative 01/19/2017 1049   KETONESUR NEGATIVE 08/17/2020 0242   PROTEINUR NEGATIVE 08/17/2020 0242   UROBILINOGEN 0.2 01/19/2017 1049   UROBILINOGEN 1.0 03/25/2015 1755   NITRITE POSITIVE (A) 08/17/2020 0242   LEUKOCYTESUR SMALL (A) 08/17/2020 0242   Sepsis Labs: '@LABRCNTIP' (procalcitonin:4,lacticidven:4) )No results found for this or any previous visit (from the past 240 hour(s)).   Radiological Exams on  Admission: DG Chest 1 View  Result Date: 08/16/2020 CLINICAL DATA:  Found down, fell EXAM: CHEST  1 VIEW COMPARISON:  06/04/2020 FINDINGS: The heart size and mediastinal contours are within normal limits. Both lungs are clear. The visualized skeletal structures are unremarkable. IMPRESSION: No active disease. Electronically Signed   By: Randa Ngo M.D.   On: 08/16/2020 23:19   CT Head Wo Contrast  Addendum Date: 08/17/2020   ADDENDUM REPORT: 08/17/2020 02:53 ADDENDUM: Additional impression point: 2.1 cm hypoattenuating nodule in the left thyroid lobe. In the setting of significant comorbidities or limited life expectancy, no follow-up recommended (ref: J Am Coll Radiol. 2015 Feb;12(2): 143-50). Electronically Signed   By: Lovena Le M.D.   On: 08/17/2020 02:53   Result Date: 08/17/2020 CLINICAL DATA:  Fall, low back and neck pain EXAM: CT HEAD WITHOUT CONTRAST CT CERVICAL SPINE WITHOUT CONTRAST TECHNIQUE: Multidetector CT imaging of the head and cervical spine was performed following the standard protocol without intravenous contrast. Multiplanar CT image reconstructions of the cervical spine were also generated. COMPARISON:  CT head and cervical spine 04/21/2020, CT a chest 04/01/2020 FINDINGS: CT HEAD FINDINGS Brain: Postsurgical changes from prior right frontal craniotomy and subjacent encephalomalacia related to patient's history of prior tumoral resection. Stable appearance from comparison prior. Additional small hypoattenuating foci in the bilateral basal ganglia likely reflecting sequela of prior lacunar type infarcts. No evidence of acute infarction, hemorrhage, hydrocephalus, extra-axial collection, visible mass lesion or mass effect. Diffuse prominence of the ventricles, cisterns and sulci compatible with parenchymal volume loss. Patchy areas of white matter hypoattenuation are most compatible with chronic microvascular angiopathy. Vascular: Atherosclerotic calcification of the carotid  siphons. No hyperdense vessel. Skull: Prior right frontal craniotomy. No significant scalp swelling. No calvarial fracture or other acute or worrisome osseous abnormality. Sinuses/Orbits: Paranasal sinuses and mastoid air cells are predominantly clear. Orbital structures are unremarkable aside from prior lens extractions. Other: None. CT CERVICAL SPINE FINDINGS Alignment: Exaggeration of the normal cervical lordosis, similar to prior exam. Stepwise retrolisthesis C2-C5 with anterolisthesis C5 on C6 and C6 on C7 is unchanged from priors and likely related to underlying degenerative changes. Skull base and vertebrae: The osseous structures appear diffusely demineralized which Laser limit detection of small or nondisplaced fractures. No visible skull base fracture. Unchanged remote appearing endplate deformities involving the inferior endplates C4, C6 as well as the superior endplates T2-T3. Additional endplate deformity at T5  is new from comparison CT chest 04/01/2020 but with a more subacute to chronic appearance. No other acute vertebral compression deformities, fracture or acute osseous abnormality is seen. Minimal calcific pannus formation posterior to the dens, nonspecific but can be seen with rheumatoid arthropathy or CPPD arthropathy. Soft tissues and spinal canal: No pre or paravertebral fluid or swelling. No visible canal hematoma. Airways patent. Cervical carotid atherosclerosis. Disc levels: No significant central canal or foraminal stenosis identified within the imaged levels of the spine. Larger disc osteophyte complexes are present C3-4 C5-6 and C6-7 resulting in at most mild canal stenoses. Additional uncinate spurring and facet hypertrophic changes result in mild-to-moderate multilevel neural foraminal narrowing as well. Upper chest: Biapical pleuroparenchymal scarring. Calcifications seen in the aortic arch and proximal great vessels. Some interlobular septal thickening and dependent predominant  ground-glass, some of which is likely atelectatic though superimposed edematous features could have a similar appearance. Other: 2.1 cm hypoattenuating nodule in the left thyroid lobe, similar to prior. IMPRESSION: 1. No evidence of acute intracranial abnormality. No significant scalp swelling or calvarial fracture. 2. Postsurgical changes from prior right frontal craniotomy and subjacent encephalomalacia related to patient's history of prior tumoral resection. 3. Chronic microvascular angiopathy and parenchymal volume loss. 4. Remote appearing endplate deformities involving the inferior endplates C4, C6 as well as the superior endplates T2-T3. Additional endplate deformity at T5 is new from comparison CT chest 04/01/2020 but with a more subacute to chronic appearance though could correlate for point tenderness. 5. No definitively acute fracture or traumatic listhesis of the cervical spine. 6. Multilevel degenerative changes of the cervical spine as described above. 7. Some interlobular septal thickening and dependent predominant ground-glass, some of which is likely atelectatic though superimposed edematous features could have a similar appearance. 8. Aortic Atherosclerosis (ICD10-I70.0). Electronically Signed: By: Lovena Le M.D. On: 08/17/2020 02:41   CT Cervical Spine Wo Contrast  Addendum Date: 08/17/2020   ADDENDUM REPORT: 08/17/2020 02:53 ADDENDUM: Additional impression point: 2.1 cm hypoattenuating nodule in the left thyroid lobe. In the setting of significant comorbidities or limited life expectancy, no follow-up recommended (ref: J Am Coll Radiol. 2015 Feb;12(2): 143-50). Electronically Signed   By: Lovena Le M.D.   On: 08/17/2020 02:53   Result Date: 08/17/2020 CLINICAL DATA:  Fall, low back and neck pain EXAM: CT HEAD WITHOUT CONTRAST CT CERVICAL SPINE WITHOUT CONTRAST TECHNIQUE: Multidetector CT imaging of the head and cervical spine was performed following the standard protocol without  intravenous contrast. Multiplanar CT image reconstructions of the cervical spine were also generated. COMPARISON:  CT head and cervical spine 04/21/2020, CT a chest 04/01/2020 FINDINGS: CT HEAD FINDINGS Brain: Postsurgical changes from prior right frontal craniotomy and subjacent encephalomalacia related to patient's history of prior tumoral resection. Stable appearance from comparison prior. Additional small hypoattenuating foci in the bilateral basal ganglia likely reflecting sequela of prior lacunar type infarcts. No evidence of acute infarction, hemorrhage, hydrocephalus, extra-axial collection, visible mass lesion or mass effect. Diffuse prominence of the ventricles, cisterns and sulci compatible with parenchymal volume loss. Patchy areas of white matter hypoattenuation are most compatible with chronic microvascular angiopathy. Vascular: Atherosclerotic calcification of the carotid siphons. No hyperdense vessel. Skull: Prior right frontal craniotomy. No significant scalp swelling. No calvarial fracture or other acute or worrisome osseous abnormality. Sinuses/Orbits: Paranasal sinuses and mastoid air cells are predominantly clear. Orbital structures are unremarkable aside from prior lens extractions. Other: None. CT CERVICAL SPINE FINDINGS Alignment: Exaggeration of the normal cervical lordosis, similar to prior exam.  Stepwise retrolisthesis C2-C5 with anterolisthesis C5 on C6 and C6 on C7 is unchanged from priors and likely related to underlying degenerative changes. Skull base and vertebrae: The osseous structures appear diffusely demineralized which [place] limit detection of small or nondisplaced fractures. No visible skull base fracture. Unchanged remote appearing endplate deformities involving the inferior endplates C4, C6 as well as the superior endplates T2-T3. Additional endplate deformity at T5 is new from comparison CT chest 04/01/2020 but with a more subacute to chronic appearance. No other acute  vertebral compression deformities, fracture or acute osseous abnormality is seen. Minimal calcific pannus formation posterior to the dens, nonspecific but can be seen with rheumatoid arthropathy or CPPD arthropathy. Soft tissues and spinal canal: No pre or paravertebral fluid or swelling. No visible canal hematoma. Airways patent. Cervical carotid atherosclerosis. Disc levels: No significant central canal or foraminal stenosis identified within the imaged levels of the spine. Larger disc osteophyte complexes are present C3-4 C5-6 and C6-7 resulting in at most mild canal stenoses. Additional uncinate spurring and facet hypertrophic changes result in mild-to-moderate multilevel neural foraminal narrowing as well. Upper chest: Biapical pleuroparenchymal scarring. Calcifications seen in the aortic arch and proximal great vessels. Some interlobular septal thickening and dependent predominant ground-glass, some of which is likely atelectatic though superimposed edematous features could have a similar appearance. Other: 2.1 cm hypoattenuating nodule in the left thyroid lobe, similar to prior. IMPRESSION: 1. No evidence of acute intracranial abnormality. No significant scalp swelling or calvarial fracture. 2. Postsurgical changes from prior right frontal craniotomy and subjacent encephalomalacia related to patient's history of prior tumoral resection. 3. Chronic microvascular angiopathy and parenchymal volume loss. 4. Remote appearing endplate deformities involving the inferior endplates C4, C6 as well as the superior endplates T2-T3. Additional endplate deformity at T5 is new from comparison CT chest 04/01/2020 but with a more subacute to chronic appearance though could correlate for point tenderness. 5. No definitively acute fracture or traumatic listhesis of the cervical spine. 6. Multilevel degenerative changes of the cervical spine as described above. 7. Some interlobular septal thickening and dependent predominant  ground-glass, some of which is likely atelectatic though superimposed edematous features could have a similar appearance. 8. Aortic Atherosclerosis (ICD10-I70.0). Electronically Signed: By: Lovena Le M.D. On: 08/17/2020 02:41   CT Lumbar Spine Wo Contrast  Result Date: 08/17/2020 CLINICAL DATA:  Fall with low back pain EXAM: CT LUMBAR SPINE WITHOUT CONTRAST TECHNIQUE: Multidetector CT imaging of the lumbar spine was performed without intravenous contrast administration. Multiplanar CT image reconstructions were also generated. COMPARISON:  None. FINDINGS: Segmentation: 5 lumbar type vertebrae. Alignment: Grade 1 L2-3 anterolisthesis. Vertebrae: Chronic compression fracture of T12 with approximately 75% height loss. There is vertebral augmentation cement. Paraspinal and other soft tissues: Calcific aortic atherosclerosis. Disc levels: L1-2: No disc herniation or stenosis. L2-3: Moderate facet hypertrophy with grade 1 anterolisthesis. Mild spinal canal stenosis. L3-4: Moderate facet hypertrophy and small disc bulge. No spinal canal or neural foraminal stenosis. L4-5: Moderate right neural foraminal stenosis due to combination of right extraforaminal osteophyte and facet hypertrophy. L5-S1: Small disc bulge. Mild left foraminal stenosis. Mild facet hypertrophy. IMPRESSION: 1. No acute abnormality of the lumbar spine. 2. Chronic T12 compression fracture with approximately 75% height loss and vertebral augmentation cement. 3. Moderate right L4-5 neural foraminal stenosis. Aortic Atherosclerosis (ICD10-I70.0). Electronically Signed   By: Ulyses Jarred M.D.   On: 08/17/2020 02:50   DG Ankle Left Port  Result Date: 08/16/2020 CLINICAL DATA:  Fall, now with pain. EXAM: PORTABLE  LEFT ANKLE - 2 VIEW COMPARISON:  None. FINDINGS: Diffusely decreased bone density. No evidence of fracture, dislocation, or joint effusion. Partially visualized old healed tibial shaft fracture. Cortical irregularity of the first metatarsal  is partially visualized and could represent an old healed fracture. Plantar calcaneal spur. No evidence of severe arthropathy. No aggressive appearing focal bone abnormality. Soft tissues are unremarkable. IMPRESSION: No acute displaced fracture or dislocation in a patient with diffusely decreased bone density. Electronically Signed   By: Iven Finn M.D.   On: 08/16/2020 23:56   DG HIP UNILAT WITH PELVIS 2-3 VIEWS LEFT  Result Date: 08/16/2020 CLINICAL DATA:  Found down, fell EXAM: DG HIP (WITH OR WITHOUT PELVIS) 2-3V LEFT COMPARISON:  None. FINDINGS: Frontal view of the pelvis as well as frontal and cross-table lateral views of the left hip are obtained. Right hip prosthesis is partially visualized and unremarkable. The left hip is unremarkable with no fracture, subluxation, or dislocation. Extensive spondylosis of the lower lumbar spine. Soft tissues are unremarkable. IMPRESSION: 1. No acute displaced fracture. 2. Unremarkable right hip arthroplasty. Electronically Signed   By: Randa Ngo M.D.   On: 08/16/2020 23:22    EKG: Independently reviewed.  Normal sinus rhythm.  Assessment/Plan Principal Problem:   Dilantin toxicity Active Problems:   Essential hypertension   Cancer of central portion of female breast ([place])   Seizure disorder ([place])   Acquired hypothyroidism   History of breast cancer    1. Dilantin toxicity -levels are higher than normal I discussed with pharmacy about dosing Dilantin.  Follow Dilantin level closely. 2. Possible UTI -follow urine cultures at this time after discussing with pharmacy placing patient on fosfomycin. 3. Followed pain and concerning features for new T5 compression fracture for which at this time I ordered MRI of the C-spine and T-spine since patient has significant pain. 4. Thyroid nodule will need follow-up as outpatient. 5. Diabetes mellitus type 2 on Lantus 10 units at bedtime.  With CBG coverage. 6. Seizure disorder see #1. 7. Hypertension  on ACE and verapamil. 8. Mild hyponatremia with mild hypokalemia closely monitor. 9. Hypothyroidism on Synthroid.   Covid test is pending.  DVT prophylaxis: Lovenox. Code Status: Full code. Family Communication: We will need to discuss with family. Disposition Plan: To be determined.  [facility] need rehab. Consults called: Physical therapy. Admission status: Observation.   Rise Patience MD [facility] Pager [phone].  If 7PM-7AM, please contact night-coverage [email or web address] Password [facility]  08/17/2020, 3:38 AM

## 2020-08-17 NOTE — ED Notes (Signed)
RN spoke with CT tech and informed them that this RN will medicate pt (with ordered fentanyl) when transport comes to bring pt back to CT

## 2020-08-17 NOTE — Consult Note (Signed)
BP 140/62 (BP Location: Left Arm)   Pulse 90   Temp 99.6 F (37.6 C) (Oral)   Resp 16   Ht _0  (1.702 m)   Wt 61.5 kg   SpO2 96%   BMI 21.24 kg/m  Called for consultation regarding new T9 compression fracture. [name] has had multiple thoracic and lumbar fractures over the years. Mri performed 4/16 revealed a new compression fracture at T9. No cord or canal compromise. Reported multiple falls this past week. States her pain was so severe she would just crumple up and go to ground. No neurological issues. Bowel and bladder are at baseline. Allergies  Allergen Reactions  . Bystolic [Nebivolol Hcl] Other (See Comments)    "extreme weakness, heaviness in legs & arms, swelling in legs/arms/face, swollen abdomen, pain in bladder, feet pain, soreness in chest"  . Carbamazepine Other (See Comments)    Blood poisoning  Other reaction(s): Unknown  . Cholestyramine Other (See Comments)    "itching rash on stomach, bloated, nausea, vomiting, sleeplessness, extreme pain in arms"  . Hydrazine Yellow [Tartrazine] Other (See Comments)    "does not reduce high blood pressure, pain in arm, high pressure, felt like I was on verge of heart attack, really weak"  . Morphine Other (See Comments)    Feels morbid, weak, still in pain  . Niacin Palpitations    Fast heart beat Other reaction(s): Unknown  . Niaspan [Niacin Er] Palpitations and Other (See Comments)    "fast heart beat, high blood pressure"  . Norvasc [Amlodipine Besylate] Other (See Comments)    "extreme fluid retention/pain)  . Optivar [Azelastine Hcl] Photosensitivity  . Repaglinide Hives  . Sular [Nisoldipine Er] Other (See Comments)    "severe headaches, swelling eyes, hands, feet, shortness of breath, weak, flushed face, brain boiling, fluid retention, high blood sugar, nervous, heart fast beating"  . Telmisartan Other (See Comments)    "headache, difficulty urinating, high blood sugar, fluid retention"  . Amoxicillin-Pot Clavulanate  Rash and Other (See Comments)  . Cefdinir Swelling    Vaginal irritation, breathing,   . Ciprofloxacin Hcl Hives  . Clonidine Other (See Comments)    Dry mouth, fluid retention  . Clonidine Hydrochloride Other (See Comments)    Dry mouth, fluid retention  . Codeine Nausea And Vomiting  . Ezetimibe Other (See Comments)    Made weak Other reaction(s): Unknown  . Hydroxychloroquine Other (See Comments)    Low platlets  . Naproxen Other (See Comments)    Shrinks bladder  . Sulfa Antibiotics Rash  . Ziac [Bisoprolol-Hydrochlorothiazide] Other (See Comments)    "stopped urination"  . Amlodipine Besy-Benazepril Hcl     Other reaction(s): cough  . Azelastine Other (See Comments)    Unknown reaction - Per MAR  . Elavil [Amitriptyline] Other (See Comments)    Gave Pt nightmares  . Empagliflozin Other (See Comments)    "Caused yeast infection, slowed my urine"  . Hydralazine Other (See Comments)    Unknown reaction - Per MAR  . Iodine I 131 Tositumomab Other (See Comments)    Unknown reaction - Per MAR  . Kenalog [Triamcinolone] Diarrhea    Per MAR  . Keppra [Levetiracetam] Other (See Comments)    Shaking  . Lamotrigine Itching and Other (See Comments)  . Norvasc [Amlodipine] Other (See Comments)    Unknown reaction - Per MAR  . Other Other (See Comments)    Other reaction(s): Unknown Other reaction(s): Unknown Other reaction(s): Unknown Other reaction(s): Unknown Other reaction(s): Unknown  .  Pregabalin Swelling    Other reaction(s): weight gain  . Pseudoephedrine   . Pseudoephedrine-Guaifenesin Er Other (See Comments)    Unknown reaction - Per MAR  . Risedronate     Unknown reaction - Per MAR  . Ru-Hist D [Brompheniramine-Phenylephrine] Other (See Comments)    Unknown reaction - Per MAR  . Sumatriptan Other (See Comments)  . Topiramate Other (See Comments)    Dry eyes Other reaction(s): Unknown  . Ace Inhibitors Other (See Comments)    unknown  . Actonel [Risedronate  Sodium] Other (See Comments)    unknown  . Amlodipine Besylate Other (See Comments)    unknown  . Aspirin Other (See Comments)    unknown  . Atacand [Candesartan] Other (See Comments)    Unknown reaction - MAR  . Bextra [Valdecoxib] Other (See Comments)    Unknown reaction - Per MAR  . Bisoprolol-Hydrochlorothiazide Other (See Comments)    Unknown reaction - Per MAR  . Candesartan Cilexetil Other (See Comments)    unknown  . Cefadroxil Other (See Comments)    unknown  . Celecoxib Rash  . Hydrocodone Other (See Comments)    unknown  . Hydrocodone-Acetaminophen Other (See Comments)    unknown  . Iodinated Diagnostic Agents Rash    "All over"  Other reaction(s): Unknown  . Meloxicam Other (See Comments)    unknown  . Methylprednisolone Sodium Succinate Other (See Comments)    unknown  . Nabumetone Other (See Comments)    Unknown reaction  . Penicillins Other (See Comments)    unknown  . Pseudoephedrine-Guaifenesin Other (See Comments)    unknown  . Risedronate Sodium Other (See Comments)    unknown  . Rofecoxib Other (See Comments)    Unknown reaction - MAR Other reaction(s): Unknown  . Ru-Tuss [Chlorphen-Pse-Atrop-Hyos-Scop] Other (See Comments)    unknown  . Sulfonamide Derivatives Other (See Comments)    unknown  . Sulphur [Elemental Sulfur] Other (See Comments)    unknown  . Telithromycin Other (See Comments)    unknown  . Terfenadine Other (See Comments)    Unknown reaction - Per MAR  . Trandolapril-Verapamil Hcl Er Other (See Comments)    Headache, difficulty urinating, high blood sugar, fluid retention  Pt is taking Tarka (trandolapril-verapamil) currently, but requests the medication stay in her allergy list  . Trandolapril-Verapamil Hcl Er Other (See Comments)    Headache, difficulty urinating, high blood sugar, fluid retention  Pt is taking Tarka (trandolapril-verapamil) currently, but requests the medication stay in her allergy list  . Valium  [Diazepam] Other (See Comments)    Makes her mean and hyper   Past Medical History:  Diagnosis Date  . Anemia   . Anxiety   . Asthma   . Breast cancer ([place]) 2014   right breast  . Cancer of right breast ([place]) 12/26/12   right breast 12:00 o'clock, DCIS  . Carotid artery disease ([place])   . Carpal tunnel syndrome, bilateral   . Chronic bronchitis ([place])   . Chronic cough   . Chronic facial pain   . Chronic foot pain   . Colon cancer ([place]) dx'd 11/2019  . Complication of anesthesia    Sore jaw; could not chew or move mouth, prolonged sedation  . Convulsions/seizures ([place]) 10/16/2014  . Diabetes mellitus    type 2 niddm x 20 years  . Dyslipidemia   . Ejection fraction   . Gait abnormality 12/04/2019  . GERD (gastroesophageal reflux disease)   . Hammer toe  bilateral  . History of colonic polyps   . History of meningioma   . HTN (hypertension)   . Hx of radiation therapy 03/07/13- 03/29/13   right breast 4250 cGy 17 sessions  . Hyperlipidemia   . Hypokalemia   . Hyponatremia   . Hypothyroidism   . IBS (irritable bowel syndrome)   . Melanoma ([place])   . Metatarsal bone fracture 2014  . Multiple drug allergies   . Nontoxic thyroid nodule   . Obesity   . Osteoarthritis   . Osteoporosis   . Palpitations   . Personal history of radiation therapy 2014  . Seasonal allergies   . Skin cancer   . Syncope   . Tremor, essential 08/18/2016  . Vitamin B12 deficiency   . Vitamin D deficiency    Past Surgical History:  Procedure Laterality Date  . ABDOMINAL HYSTERECTOMY    . BIOPSY  11/07/2019   Procedure: BIOPSY;  Surgeon: Ronald Lobo, MD;  Location: [facility];  Service: Endoscopy;;  . BRAIN SURGERY    . BREAST BIOPSY Right 01/24/2013   Procedure: RE-EXCICION OF BREAST CANCER, ANTERIOR MARGINS;  Surgeon: Edward Jolly, MD;  Location: [facility];  Service: General;  Laterality: Right;  . BREAST LUMPECTOMY Right 2014  . BREAST LUMPECTOMY WITH NEEDLE LOCALIZATION Right  01/17/2013   Procedure: BREAST LUMPECTOMY WITH NEEDLE LOCALIZATION;  Surgeon: Edward Jolly, MD;  Location: [facility];  Service: General;  Laterality: Right;  . BUNIONECTOMY Bilateral   . CATARACT EXTRACTION W/ INTRAOCULAR LENS IMPLANT Right   . CHOLECYSTECTOMY    . COLONOSCOPY    . COLONOSCOPY WITH PROPOFOL N/A 11/07/2019   Procedure: COLONOSCOPY WITH PROPOFOL;  Surgeon: Ronald Lobo, MD;  Location: [facility];  Service: Endoscopy;  Laterality: N/A;  . CRANIOTOMY Right 10/18/2013   Procedure: CRANIOTOMY TUMOR EXCISION;  Surgeon: Floyce Stakes, MD;  Location: [facility];  Service: Neurosurgery;  Laterality: Right;  . ESOPHAGOGASTRODUODENOSCOPY (EGD) WITH PROPOFOL N/A 11/07/2019   Procedure: ESOPHAGOGASTRODUODENOSCOPY (EGD) WITH PROPOFOL;  Surgeon: Ronald Lobo, MD;  Location: [facility];  Service: Endoscopy;  Laterality: N/A;  . EYE SURGERY    . HERNIA REPAIR    . IR THORACENTESIS ASP PLEURAL SPACE W/IMG GUIDE  04/01/2020  . KNEE ARTHROSCOPY Bilateral   . LAPAROSCOPIC RIGHT HEMI COLECTOMY Right 03/06/2020   Procedure: LAPAROSCOPIC RIGHT HEMI COLECTOMY WITH TAP BLOCK AND LYSIS OF ADHESIONS;  Surgeon: Ileana Roup, MD;  Location: [facility];  Service: General;  Laterality: Right;  . POLYPECTOMY     small adenomatous  . TOTAL HIP ARTHROPLASTY Right 06/05/2020   Procedure: ARTHROPLASTY  ANTERIOR APPROACH. RIGHT HIP;  Surgeon: Rod Can, MD;  Location: [facility];  Service: Orthopedics;  Laterality: Right;  . ULNAR TUNNEL RELEASE     Prior to Admission medications   Medication Sig Start Date End Date Taking? Authorizing Provider  acetaminophen (TYLENOL) 500 MG tablet Take 1,000 mg by mouth every 8 (eight) hours as needed for mild pain or headache.   Yes [provider]  ALPRAZolam (XANAX) 0.25 MG tablet Take 0.25 mg by mouth 2 (two) times daily as needed for anxiety or sleep. 05/30/19  Yes [provider]  Blood Glucose Monitoring Suppl (PRODIGY VOICE BLOOD  GLUCOSE) w/Device KIT Use to check blood sugar 1 time per day. 10/18/15  Yes Renato Shin, MD  Cholecalciferol (VITAMIN D-3) 125 MCG (5000 UT) TABS Take 5,000 Units by mouth daily.   Yes [provider]  cholestyramine (QUESTRAN) 4 g packet  Take 4 g by mouth 2 (two) times daily.   Yes [provider]  diclofenac Sodium (VOLTAREN) 1 % GEL Apply 2 g topically 4 (four) times daily as needed (pain). knees 06/01/19  Yes [provider]  feeding supplement, GLUCERNA SHAKE, (GLUCERNA SHAKE) LIQD Take 237 mLs by mouth daily.   Yes [provider]  glucose blood (GLUCOSE METER TEST) test strip See admin instructions.   Yes [provider]  Glucose Blood (PRODIGY VOICE BLOOD GLUCOSE VI)    Yes [provider]  insulin aspart (NOVOLOG) 100 UNIT/ML FlexPen Inject 2-12 Units into the skin See admin instructions. Injects 2-10 units of insulin under the skin 3 times a day per sliding scale: CBG 0-150: 2 units; CBG 151-200: 4 units; CBG 201-250: 6 units; CBG 251-300: 8 units; CBG 301-350: 10 units; CBG 351-400: 12 units; CBG >400: 12 units and notify the MD/NP 07/08/20  Yes Fargo, Amy E, NP  insulin glargine (LANTUS SOLOSTAR) 100 UNIT/ML Solostar Pen Inject 14 Units into the skin at bedtime. Patient taking differently: Inject 10 Units into the skin at bedtime. 07/08/20  Yes Fargo, Amy E, NP  Insulin Pen Needle 32G X 4 MM MISC Used to inject insulin 3x daily 11/19/16  Yes Renato Shin, MD  levothyroxine (SYNTHROID) 75 MCG tablet Take 1 tablet (75 mcg total) by mouth daily before breakfast. 07/08/20  Yes Fargo, Amy E, NP  lidocaine (LIDODERM) 5 % Place 1 patch onto the skin daily. Remove & Discard patch within 12 hours or as directed by MD 07/08/20  Yes Fargo, Amy E, NP  ondansetron (ZOFRAN-ODT) 4 MG disintegrating tablet Take 1 tablet (4 mg total) by mouth every 6 (six) hours as needed for nausea. 03/19/20  Yes Ileana Roup, MD  pantoprazole (PROTONIX) 40 MG tablet  Take 1 tablet (40 mg total) by mouth daily at 6 (six) AM. 07/08/20  Yes Fargo, Amy E, NP  phenytoin (DILANTIN) 100 MG ER capsule Take 1 capsule (100 mg total) by mouth daily. Patient taking differently: Take 100 mg by mouth 2 (two) times daily. 07/08/20  Yes Fargo, Amy E, NP  phenytoin (DILANTIN) 50 MG tablet Chew 1 tablet (50 mg total) by mouth daily. Take with 152m capsule for a total of 1523mevery morning. 07/08/20  Yes Fargo, Amy E, NP  Polyethyl Glycol-Propyl Glycol (SYSTANE) 0.4-0.3 % SOLN Place 2 drops into both eyes every 12 (twelve) hours as needed (dry eyes).   Yes [provider]  primidone (MYSOLINE) 50 MG tablet Take 2 tablets (100 mg total) by mouth in the morning and at bedtime. 08/07/20  Yes WiKathrynn DuckingMD  traMADol (ULTRAM) 50 MG tablet Take 50 mg by mouth daily as needed for moderate pain.   Yes [provider]  trandolapril-verapamil (TARKA) 2-240 MG tablet Take 1 tablet by mouth at bedtime. 07/08/20  Yes Fargo, Amy E, NP  escitalopram (LEXAPRO) 10 MG tablet Take 1 tablet (10 mg total) by mouth daily. Patient not taking: No sig reported 07/08/20   FaWindell Moulding, NP  furosemide (LASIX) 20 MG tablet Take 1 tablet (20 mg total) by mouth daily. Patient not taking: No sig reported 07/08/20   FaWindell Moulding, NP  loperamide (IMODIUM) 2 MG capsule Take 4 mg by mouth 2 (two) times daily. For Chronic Diarrhea Patient not taking: No sig reported    [provider]  senna-docusate (SENOKOT-S) 8.6-50 MG tablet Take 1 tablet by mouth 2 (two) times daily. Patient not taking:  No sig reported 06/10/20   Eugenie Filler, MD   DG Chest 1 View  Result Date: 08/16/2020 CLINICAL DATA:  Found down, fell EXAM: CHEST  1 VIEW COMPARISON:  06/04/2020 FINDINGS: The heart size and mediastinal contours are within normal limits. Both lungs are clear. The visualized skeletal structures are unremarkable. IMPRESSION: No active disease. Electronically Signed   By: Randa Ngo M.D.   On:  08/16/2020 23:19   CT Head Wo Contrast  Addendum Date: 08/17/2020   ADDENDUM REPORT: 08/17/2020 02:53 ADDENDUM: Additional impression point: 2.1 cm hypoattenuating nodule in the left thyroid lobe. In the setting of significant comorbidities or limited life expectancy, no follow-up recommended (ref: J Am Coll Radiol. 2015 Feb;12(2): 143-50). Electronically Signed   By: Lovena Le M.D.   On: 08/17/2020 02:53   Result Date: 08/17/2020 CLINICAL DATA:  Fall, low back and neck pain EXAM: CT HEAD WITHOUT CONTRAST CT CERVICAL SPINE WITHOUT CONTRAST TECHNIQUE: Multidetector CT imaging of the head and cervical spine was performed following the standard protocol without intravenous contrast. Multiplanar CT image reconstructions of the cervical spine were also generated. COMPARISON:  CT head and cervical spine 04/21/2020, CT a chest 04/01/2020 FINDINGS: CT HEAD FINDINGS Brain: Postsurgical changes from prior right frontal craniotomy and subjacent encephalomalacia related to patient's history of prior tumoral resection. Stable appearance from comparison prior. Additional small hypoattenuating foci in the bilateral basal ganglia likely reflecting sequela of prior lacunar type infarcts. No evidence of acute infarction, hemorrhage, hydrocephalus, extra-axial collection, visible mass lesion or mass effect. Diffuse prominence of the ventricles, cisterns and sulci compatible with parenchymal volume loss. Patchy areas of white matter hypoattenuation are most compatible with chronic microvascular angiopathy. Vascular: Atherosclerotic calcification of the carotid siphons. No hyperdense vessel. Skull: Prior right frontal craniotomy. No significant scalp swelling. No calvarial fracture or other acute or worrisome osseous abnormality. Sinuses/Orbits: Paranasal sinuses and mastoid air cells are predominantly clear. Orbital structures are unremarkable aside from prior lens extractions. Other: None. CT CERVICAL SPINE FINDINGS  Alignment: Exaggeration of the normal cervical lordosis, similar to prior exam. Stepwise retrolisthesis C2-C5 with anterolisthesis C5 on C6 and C6 on C7 is unchanged from priors and likely related to underlying degenerative changes. Skull base and vertebrae: The osseous structures appear diffusely demineralized which Dingus limit detection of small or nondisplaced fractures. No visible skull base fracture. Unchanged remote appearing endplate deformities involving the inferior endplates C4, C6 as well as the superior endplates T2-T3. Additional endplate deformity at T5 is new from comparison CT chest 04/01/2020 but with a more subacute to chronic appearance. No other acute vertebral compression deformities, fracture or acute osseous abnormality is seen. Minimal calcific pannus formation posterior to the dens, nonspecific but can be seen with rheumatoid arthropathy or CPPD arthropathy. Soft tissues and spinal canal: No pre or paravertebral fluid or swelling. No visible canal hematoma. Airways patent. Cervical carotid atherosclerosis. Disc levels: No significant central canal or foraminal stenosis identified within the imaged levels of the spine. Larger disc osteophyte complexes are present C3-4 C5-6 and C6-7 resulting in at most mild canal stenoses. Additional uncinate spurring and facet hypertrophic changes result in mild-to-moderate multilevel neural foraminal narrowing as well. Upper chest: Biapical pleuroparenchymal scarring. Calcifications seen in the aortic arch and proximal great vessels. Some interlobular septal thickening and dependent predominant ground-glass, some of which is likely atelectatic though superimposed edematous features could have a similar appearance. Other: 2.1 cm hypoattenuating nodule in the left thyroid lobe, similar to prior. IMPRESSION: 1. No evidence of  acute intracranial abnormality. No significant scalp swelling or calvarial fracture. 2. Postsurgical changes from prior right frontal  craniotomy and subjacent encephalomalacia related to patient's history of prior tumoral resection. 3. Chronic microvascular angiopathy and parenchymal volume loss. 4. Remote appearing endplate deformities involving the inferior endplates C4, C6 as well as the superior endplates T2-T3. Additional endplate deformity at T5 is new from comparison CT chest 04/01/2020 but with a more subacute to chronic appearance though could correlate for point tenderness. 5. No definitively acute fracture or traumatic listhesis of the cervical spine. 6. Multilevel degenerative changes of the cervical spine as described above. 7. Some interlobular septal thickening and dependent predominant ground-glass, some of which is likely atelectatic though superimposed edematous features could have a similar appearance. 8. Aortic Atherosclerosis (ICD10-I70.0). Electronically Signed: By: Lovena Le M.D. On: 08/17/2020 02:41   CT Cervical Spine Wo Contrast  Addendum Date: 08/17/2020   ADDENDUM REPORT: 08/17/2020 02:53 ADDENDUM: Additional impression point: 2.1 cm hypoattenuating nodule in the left thyroid lobe. In the setting of significant comorbidities or limited life expectancy, no follow-up recommended (ref: J Am Coll Radiol. 2015 Feb;12(2): 143-50). Electronically Signed   By: Lovena Le M.D.   On: 08/17/2020 02:53   Result Date: 08/17/2020 CLINICAL DATA:  Fall, low back and neck pain EXAM: CT HEAD WITHOUT CONTRAST CT CERVICAL SPINE WITHOUT CONTRAST TECHNIQUE: Multidetector CT imaging of the head and cervical spine was performed following the standard protocol without intravenous contrast. Multiplanar CT image reconstructions of the cervical spine were also generated. COMPARISON:  CT head and cervical spine 04/21/2020, CT a chest 04/01/2020 FINDINGS: CT HEAD FINDINGS Brain: Postsurgical changes from prior right frontal craniotomy and subjacent encephalomalacia related to patient's history of prior tumoral resection. Stable appearance  from comparison prior. Additional small hypoattenuating foci in the bilateral basal ganglia likely reflecting sequela of prior lacunar type infarcts. No evidence of acute infarction, hemorrhage, hydrocephalus, extra-axial collection, visible mass lesion or mass effect. Diffuse prominence of the ventricles, cisterns and sulci compatible with parenchymal volume loss. Patchy areas of white matter hypoattenuation are most compatible with chronic microvascular angiopathy. Vascular: Atherosclerotic calcification of the carotid siphons. No hyperdense vessel. Skull: Prior right frontal craniotomy. No significant scalp swelling. No calvarial fracture or other acute or worrisome osseous abnormality. Sinuses/Orbits: Paranasal sinuses and mastoid air cells are predominantly clear. Orbital structures are unremarkable aside from prior lens extractions. Other: None. CT CERVICAL SPINE FINDINGS Alignment: Exaggeration of the normal cervical lordosis, similar to prior exam. Stepwise retrolisthesis C2-C5 with anterolisthesis C5 on C6 and C6 on C7 is unchanged from priors and likely related to underlying degenerative changes. Skull base and vertebrae: The osseous structures appear diffusely demineralized which Carreno limit detection of small or nondisplaced fractures. No visible skull base fracture. Unchanged remote appearing endplate deformities involving the inferior endplates C4, C6 as well as the superior endplates T2-T3. Additional endplate deformity at T5 is new from comparison CT chest 04/01/2020 but with a more subacute to chronic appearance. No other acute vertebral compression deformities, fracture or acute osseous abnormality is seen. Minimal calcific pannus formation posterior to the dens, nonspecific but can be seen with rheumatoid arthropathy or CPPD arthropathy. Soft tissues and spinal canal: No pre or paravertebral fluid or swelling. No visible canal hematoma. Airways patent. Cervical carotid atherosclerosis. Disc levels:  No significant central canal or foraminal stenosis identified within the imaged levels of the spine. Larger disc osteophyte complexes are present C3-4 C5-6 and C6-7 resulting in at most mild canal stenoses. Additional  uncinate spurring and facet hypertrophic changes result in mild-to-moderate multilevel neural foraminal narrowing as well. Upper chest: Biapical pleuroparenchymal scarring. Calcifications seen in the aortic arch and proximal great vessels. Some interlobular septal thickening and dependent predominant ground-glass, some of which is likely atelectatic though superimposed edematous features could have a similar appearance. Other: 2.1 cm hypoattenuating nodule in the left thyroid lobe, similar to prior. IMPRESSION: 1. No evidence of acute intracranial abnormality. No significant scalp swelling or calvarial fracture. 2. Postsurgical changes from prior right frontal craniotomy and subjacent encephalomalacia related to patient's history of prior tumoral resection. 3. Chronic microvascular angiopathy and parenchymal volume loss. 4. Remote appearing endplate deformities involving the inferior endplates C4, C6 as well as the superior endplates T2-T3. Additional endplate deformity at T5 is new from comparison CT chest 04/01/2020 but with a more subacute to chronic appearance though could correlate for point tenderness. 5. No definitively acute fracture or traumatic listhesis of the cervical spine. 6. Multilevel degenerative changes of the cervical spine as described above. 7. Some interlobular septal thickening and dependent predominant ground-glass, some of which is likely atelectatic though superimposed edematous features could have a similar appearance. 8. Aortic Atherosclerosis (ICD10-I70.0). Electronically Signed: By: Lovena Le M.D. On: 08/17/2020 02:41   CT Lumbar Spine Wo Contrast  Result Date: 08/17/2020 CLINICAL DATA:  Fall with low back pain EXAM: CT LUMBAR SPINE WITHOUT CONTRAST TECHNIQUE:  Multidetector CT imaging of the lumbar spine was performed without intravenous contrast administration. Multiplanar CT image reconstructions were also generated. COMPARISON:  None. FINDINGS: Segmentation: 5 lumbar type vertebrae. Alignment: Grade 1 L2-3 anterolisthesis. Vertebrae: Chronic compression fracture of T12 with approximately 75% height loss. There is vertebral augmentation cement. Paraspinal and other soft tissues: Calcific aortic atherosclerosis. Disc levels: L1-2: No disc herniation or stenosis. L2-3: Moderate facet hypertrophy with grade 1 anterolisthesis. Mild spinal canal stenosis. L3-4: Moderate facet hypertrophy and small disc bulge. No spinal canal or neural foraminal stenosis. L4-5: Moderate right neural foraminal stenosis due to combination of right extraforaminal osteophyte and facet hypertrophy. L5-S1: Small disc bulge. Mild left foraminal stenosis. Mild facet hypertrophy. IMPRESSION: 1. No acute abnormality of the lumbar spine. 2. Chronic T12 compression fracture with approximately 75% height loss and vertebral augmentation cement. 3. Moderate right L4-5 neural foraminal stenosis. Aortic Atherosclerosis (ICD10-I70.0). Electronically Signed   By: Ulyses Jarred M.D.   On: 08/17/2020 02:50   MR CERVICAL SPINE WO CONTRAST  Result Date: 08/17/2020 CLINICAL DATA:  Myelopathy, acute or progressive. Additional history provided: Fall at home. Back pain. EXAM: MRI CERVICAL SPINE WITHOUT CONTRAST TECHNIQUE: Multiplanar, multisequence MR imaging of the cervical spine was performed. No intravenous contrast was administered. COMPARISON:  Prior CT examinations of the cervical spine 08/17/2020 and earlier. FINDINGS: Intermittently motion degraded examination. Most notably, there is moderate motion degradation of the axial T2 GRE sequence. Alignment: Trace multilevel spondylolisthesis. Vertebrae: Cervical vertebral body height is maintained. Mild chronic T1, T2 and T3 superior endplate compression  deformities. Trace degenerative edema along the T1 superior endplate anteriorly. Elsewhere, no significant marrow edema or focal suspicious osseous lesion is identified. Cord: No spinal cord signal abnormality is identified. Posterior Fossa, vertebral arteries, paraspinal tissues: No abnormality identified within included portions of the posterior fossa. Flow voids preserved within the imaged cervical vertebral arteries. Paraspinal soft tissues within normal limits. Mild STIR hyperintense signal within the C3-C4 interspinous space, which Ochs be degenerative or Gracie reflect interspinous ligamentous injury. Disc levels: Multilevel disc degeneration. Most notably, there is moderate disc degeneration at C4-C5,  C5-C6 and C6-C7. C2-C3: Shallow broad-based central disc protrusion. Mild facet arthrosis and ligamentum flavum hypertrophy. No significant spinal canal stenosis or neural foraminal narrowing. C3-C4: Shallow broad-based central disc protrusion. Uncovertebral, facet and ligamentum flavum hypertrophy. Mild relative spinal canal narrowing. No significant foraminal stenosis. C4-C5: Posterior disc osteophyte complex. Uncovertebral, facet and ligamentum flavum hypertrophy. Moderate spinal canal stenosis with contact upon the dorsal and ventral spinal cord. Bilateral neural foraminal narrowing (severe right, moderate left). C5-C6: Shallow disc bulge. Facet arthrosis. Mild effacement of the ventral thecal sac with mild relative spinal canal narrowing. No spinal cord mass effect. Moderate bilateral neural foraminal narrowing. C6-C7: Posterior disc osteophyte complex. Uncovertebral, facet and ligamentum flavum hypertrophy. Mild relative spinal canal narrowing without spinal cord mass effect. Bilateral neural foraminal narrowing (mild right, moderate/severe left). C7-T1: Minimal disc bulge. Mild facet arthrosis. No significant spinal canal or foraminal stenosis. IMPRESSION: Motion degraded examination. Subtle STIR  hyperintense signal within the C3-C4 interspinous space, which [place] be degenerative in etiology or Crowson reflect interspinous ligament injury. Cervical spondylosis as outlined with multilevel spinal canal and neural foraminal narrowing. Spinal canal stenosis is greatest at C4-C5 (moderate in severity) with contact upon the dorsal and ventral spinal cord. Multilevel neural foraminal narrowing greatest bilaterally at C4-C5 (severe right, moderate left), bilaterally at C5-C6 (moderate) and on the left at C6-C7 (moderate/severe). Mild chronic superior endplate compression deformities at T1, T2 and T3. Electronically Signed   By: Kellie Simmering DO   On: 08/17/2020 11:55   MR THORACIC SPINE WO CONTRAST  Result Date: 08/17/2020 CLINICAL DATA:  Osteoarthritis, thoracic spine. Recent falls. Back pain. EXAM: MRI THORACIC SPINE WITHOUT CONTRAST TECHNIQUE: Multiplanar, multisequence MR imaging of the thoracic spine was performed. No intravenous contrast was administered. COMPARISON:  Prior cervical spine CT examinations 04/21/2020 and earlier. Prior thoracic spine MRI 11/29/2015. FINDINGS: Alignment: Exaggerated thoracic kyphosis. Trace T3-T4, T4-T5 grade 1 anterolisthesis. Bony retropulsion at the level of the T12 superior endplate. Vertebrae: Redemonstrated severe T12 vertebral compression fracture status post vertebral augmentation. New mild T9 vertebral compression fracture with mild edema at this site, acute/subacute. Moderate chronic T8 vertebral compression fracture with slight interval height loss as compared to the prior MRI of 11/29/2015. Redemonstrated mild T7 superior endplate compression deformity. Redemonstrated mild chronic T4 superior endplate compression deformity. Mild T1, T2 and T3 superior endplate compression deformities are new as compared to the prior MRI, but chronic. Mild degenerative edema along the T1 superior endplate anteriorly. T6 and T10 vertebral body hemangiomas. Cord:  No spinal cord signal  abnormality is identified. Paraspinal and other soft tissues: No paraspinal mass or collection. Disc levels: At T11-T12, there is moderate disc degeneration. Mild bony retropulsion at the level of the T12 superior endplate. Mild effacement of ventral thecal sac and mild relative spinal canal narrowing without spinal cord mass effect. No significant foraminal stenosis. At T12-L1, mild bony retropulsion at the level of the T12 inferior endplate. This partially effaces the ventral thecal sac and mildly narrows the spinal canal without spinal cord mass effect. Mild right neural foraminal narrowing. Mild degenerative changes elsewhere within the thoracic spine with shallow multilevel disc bulges, but no focal disc herniation, spinal canal stenosis or neural foraminal narrowing. Multilevel nerve root sheath cyst. IMPRESSION: Mild acute/early subacute T9 vertebral compression fracture with mild vertebral body edema. Chronic vertebral compression fractures at T12, T8, T7, T4, T3, T2 and T1, as described. The T1, T2 and T3 compression fractures have developed since the prior MRI of 11/29/2015. Additionally, there has been  mild progressive height loss at the T8 compression fracture. As before, bony retropulsion at the T12 level mildly narrows the spinal canal. Unchanged mild degenerative disease of the thoracic spine without significant degenerative canal stenosis or foraminal narrowing. Electronically Signed   By: Kellie Simmering DO   On: 08/17/2020 12:42   DG Ankle Left Port  Result Date: 08/16/2020 CLINICAL DATA:  Fall, now with pain. EXAM: PORTABLE LEFT ANKLE - 2 VIEW COMPARISON:  None. FINDINGS: Diffusely decreased bone density. No evidence of fracture, dislocation, or joint effusion. Partially visualized old healed tibial shaft fracture. Cortical irregularity of the first metatarsal is partially visualized and could represent an old healed fracture. Plantar calcaneal spur. No evidence of severe arthropathy. No  aggressive appearing focal bone abnormality. Soft tissues are unremarkable. IMPRESSION: No acute displaced fracture or dislocation in a patient with diffusely decreased bone density. Electronically Signed   By: Iven Finn M.D.   On: 08/16/2020 23:56   DG HIP UNILAT WITH PELVIS 2-3 VIEWS LEFT  Result Date: 08/16/2020 CLINICAL DATA:  Found down, fell EXAM: DG HIP (WITH OR WITHOUT PELVIS) 2-3V LEFT COMPARISON:  None. FINDINGS: Frontal view of the pelvis as well as frontal and cross-table lateral views of the left hip are obtained. Right hip prosthesis is partially visualized and unremarkable. The left hip is unremarkable with no fracture, subluxation, or dislocation. Extensive spondylosis of the lower lumbar spine. Soft tissues are unremarkable. IMPRESSION: 1. No acute displaced fracture. 2. Unremarkable right hip arthroplasty. Electronically Signed   By: Randa Ngo M.D.   On: 08/16/2020 23:22  She is alert and oriented x 4 Moving all extremities Symmetric facies, tongue and uvula midline Hearing intact to voice A/P No operative intervention is warranted. Would treat pain as needed. Do not  believe brace would be useful, I do believe the fracture will heal on its own. No restrictions needed. Should absolutely be cleared or determination made to reduce fall risk.

## 2020-08-17 NOTE — Progress Notes (Signed)
PROGRESS NOTE    [name]  WCH:[id number] DOB: [birth year] DOA: 08/16/2020 PCP: Bartholome Bill, MD   Brief Narrative: [name] is a 87 y.o. female with a history of seizures, diabetes, hypothyroidism, hypertension, depression. Patient presented secondary to a fall and resultant back pain, found to have dilantin toxicity and evidence of acute compression fracture. Pain control regimen initiated.   Assessment & Plan:   Principal Problem:   Dilantin toxicity Active Problems:   Essential hypertension   Cancer of central portion of female breast ([place])   Seizure disorder ([place])   Acquired hypothyroidism   History of breast cancer   Dilantin toxicity Patient dispenses medication on her own for her daily regimens. Phenytoin level of 21.9 on admission. No new changes prescribed dose. Last phenytoin level of 6.4 (low) on 03/31/20. This appears to be most likely unintentional overdosing by patient since there have been no dose adjustments by her neurologist in over one year and definitely not since 03/31/20. -Hold phenytoin  Possible UTI Urine significant for nitrites and bacteria. No specific symptoms. Fosfomycin given on admission. Urine culture pending.  Back pain MRI C-spine and T-spine obtained. Significant for multiple chronic compression fractures but also a new T9 compression fracture. -Neurosurgery consult for management -Continue analgesics prn  Thyroid nodule -Outpatient follow-up  Diabetes mellitus, type 2 -Continue Lantus and SSI  Seizure disorder Patient is on phenytoin as mentioned above which is being held secondary to above  Primary hypertension -Continue trandolapril and verapamil  Tremors -Continue primidone  Hyponatremia Mild. -Continue IV fluids  Hypothyroidism -Continue synthroid  DVT prophylaxis: Lovenox Code Status:   Code Status: Full Code Family Communication: Niece on telephone Disposition Plan: Discharge home vs SNF likely in 1-4  days pending improvement of pain, neurosurgery recommendations, PT/OT recommendations   Consultants:   Neurosurgery  Procedures:   None  Antimicrobials:  Fosfomycin    Subjective: Significant back pain when sitting up. Otherwise, pain is managed when lying down.  Objective: Vitals:   08/17/20 0448 08/17/20 0515 08/17/20 0822 08/17/20 1202  BP: 130/70  131/65 121/76  Pulse: 75   74  Resp: 20   16  Temp: 98.5 F (36.9 C)   98.3 F (36.8 C)  TempSrc: Oral   Oral  SpO2: 99%   100%  Weight:  61.5 kg    Height:        Intake/Output Summary (Last 24 hours) at 08/17/2020 1403 Last data filed at 08/17/2020 1236 Gross per 24 hour  Intake 139.73 ml  Output 500 ml  Net -360.27 ml   Filed Weights   08/16/20 2200 08/17/20 0515  Weight: 62.1 kg 61.5 kg    Examination:  General exam: Appears calm and comfortable Respiratory system: Clear to auscultation. Respiratory effort normal. Cardiovascular system: S1 & S2 heard, RRR. No murmurs, rubs, gallops or clicks. Gastrointestinal system: Abdomen is nondistended, soft and nontender. No organomegaly or masses felt. Normal bowel sounds heard. Central nervous system: Sleeping but arouses easily. No focal neurological deficits. Musculoskeletal: No edema. No calf tenderness Skin: No cyanosis. No rashes Psychiatry: Judgement and insight appear normal. Mood & affect appropriate.     Data Reviewed: I have personally reviewed following labs and imaging studies  CBC Lab Results  Component Value Date   WBC 7.1 08/17/2020   RBC 3.68 (L) 08/17/2020   HGB 12.0 08/17/2020   HCT 35.8 (L) 08/17/2020   MCV 97.3 08/17/2020   MCH 32.6 08/17/2020   PLT 251 08/17/2020  MCHC 33.5 08/17/2020   RDW 13.2 08/17/2020   LYMPHSABS 1.0 08/17/2020   MONOABS 0.4 08/17/2020   EOSABS 0.1 08/17/2020   BASOSABS 0.0 16/60/6301     Last metabolic panel Lab Results  Component Value Date   NA 133 (L) 08/17/2020   K 4.8 08/17/2020   CL 103  08/17/2020   CO2 22 08/17/2020   BUN 11 08/17/2020   CREATININE 0.72 08/17/2020   GLUCOSE 86 08/17/2020   GFRNONAA >60 08/17/2020   GFRAA >60 01/02/2020   CALCIUM 9.0 08/17/2020   PHOS 3.7 05/03/2007   PROT 6.3 (L) 08/17/2020   ALBUMIN 3.9 08/17/2020   LABGLOB 1.7 08/31/2017   AGRATIO 2.4 (H) 08/31/2017   BILITOT 0.8 08/17/2020   ALKPHOS 95 08/17/2020   AST 26 08/17/2020   ALT 23 08/17/2020   ANIONGAP 8 08/17/2020    CBG (last 3)  Recent Labs    08/17/20 0207 08/17/20 0606 08/17/20 1108  GLUCAP 102* 81 123*     GFR: Estimated Creatinine Clearance: 47.2 mL/min (by C-G formula based on SCr of 0.72 mg/dL).  Coagulation Profile: No results for input(s): INR, PROTIME in the last 168 hours.  Recent Results (from the past 240 hour(s))  SARS CORONAVIRUS 2 (TAT 6-24 HRS) Nasopharyngeal Nasopharyngeal Swab     Status: None   Collection Time: 08/17/20  4:57 AM   Specimen: Nasopharyngeal Swab  Result Value Ref Range Status   SARS Coronavirus 2 NEGATIVE NEGATIVE Final    Comment: (NOTE) SARS-CoV-2 target nucleic acids are NOT DETECTED.  The SARS-CoV-2 RNA is generally detectable in upper and lower respiratory specimens during the acute phase of infection. Negative results do not preclude SARS-CoV-2 infection, do not rule out co-infections with other pathogens, and should not be used as the sole basis for treatment or other patient management decisions. Negative results must be combined with clinical observations, patient history, and epidemiological information. The expected result is Negative.  Fact Sheet for Patients: SugarRoll.be  Fact Sheet for Healthcare Providers: https://www.woods-mathews.com/  This test is not yet approved or cleared by the [place] FDA and  has been authorized for detection and/or diagnosis of SARS-CoV-2 by FDA under an Emergency Use Authorization (EUA). This EUA will remain  in effect (meaning  this test can be used) for the duration of the COVID-19 declaration under Se ction 564(b)(1) of the Act, 21 U.S.C. section 360bbb-3(b)(1), unless the authorization is terminated or revoked sooner.  Performed at [facility], [place] [address], [place], [place] [address]         Radiology Studies: DG Chest 1 View  Result Date: 08/16/2020 CLINICAL DATA:  Found down, fell EXAM: CHEST  1 VIEW COMPARISON:  06/04/2020 FINDINGS: The heart size and mediastinal contours are within normal limits. Both lungs are clear. The visualized skeletal structures are unremarkable. IMPRESSION: No active disease. Electronically Signed   By: Randa Ngo M.D.   On: 08/16/2020 23:19   CT Head Wo Contrast  Addendum Date: 08/17/2020   ADDENDUM REPORT: 08/17/2020 02:53 ADDENDUM: Additional impression point: 2.1 cm hypoattenuating nodule in the left thyroid lobe. In the setting of significant comorbidities or limited life expectancy, no follow-up recommended (ref: J Am Coll Radiol. 2015 Feb;12(2): 143-50). Electronically Signed   By: Lovena Le M.D.   On: 08/17/2020 02:53   Result Date: 08/17/2020 CLINICAL DATA:  Fall, low back and neck pain EXAM: CT HEAD WITHOUT CONTRAST CT CERVICAL SPINE WITHOUT CONTRAST TECHNIQUE: Multidetector CT imaging of the head and cervical  spine was performed following the standard protocol without intravenous contrast. Multiplanar CT image reconstructions of the cervical spine were also generated. COMPARISON:  CT head and cervical spine 04/21/2020, CT a chest 04/01/2020 FINDINGS: CT HEAD FINDINGS Brain: Postsurgical changes from prior right frontal craniotomy and subjacent encephalomalacia related to patient's history of prior tumoral resection. Stable appearance from comparison prior. Additional small hypoattenuating foci in the bilateral basal ganglia likely reflecting sequela of prior lacunar type infarcts. No evidence of acute infarction, hemorrhage, hydrocephalus, extra-axial  collection, visible mass lesion or mass effect. Diffuse prominence of the ventricles, cisterns and sulci compatible with parenchymal volume loss. Patchy areas of white matter hypoattenuation are most compatible with chronic microvascular angiopathy. Vascular: Atherosclerotic calcification of the carotid siphons. No hyperdense vessel. Skull: Prior right frontal craniotomy. No significant scalp swelling. No calvarial fracture or other acute or worrisome osseous abnormality. Sinuses/Orbits: Paranasal sinuses and mastoid air cells are predominantly clear. Orbital structures are unremarkable aside from prior lens extractions. Other: None. CT CERVICAL SPINE FINDINGS Alignment: Exaggeration of the normal cervical lordosis, similar to prior exam. Stepwise retrolisthesis C2-C5 with anterolisthesis C5 on C6 and C6 on C7 is unchanged from priors and likely related to underlying degenerative changes. Skull base and vertebrae: The osseous structures appear diffusely demineralized which [place] limit detection of small or nondisplaced fractures. No visible skull base fracture. Unchanged remote appearing endplate deformities involving the inferior endplates C4, C6 as well as the superior endplates T2-T3. Additional endplate deformity at T5 is new from comparison CT chest 04/01/2020 but with a more subacute to chronic appearance. No other acute vertebral compression deformities, fracture or acute osseous abnormality is seen. Minimal calcific pannus formation posterior to the dens, nonspecific but can be seen with rheumatoid arthropathy or CPPD arthropathy. Soft tissues and spinal canal: No pre or paravertebral fluid or swelling. No visible canal hematoma. Airways patent. Cervical carotid atherosclerosis. Disc levels: No significant central canal or foraminal stenosis identified within the imaged levels of the spine. Larger disc osteophyte complexes are present C3-4 C5-6 and C6-7 resulting in at most mild canal stenoses. Additional  uncinate spurring and facet hypertrophic changes result in mild-to-moderate multilevel neural foraminal narrowing as well. Upper chest: Biapical pleuroparenchymal scarring. Calcifications seen in the aortic arch and proximal great vessels. Some interlobular septal thickening and dependent predominant ground-glass, some of which is likely atelectatic though superimposed edematous features could have a similar appearance. Other: 2.1 cm hypoattenuating nodule in the left thyroid lobe, similar to prior. IMPRESSION: 1. No evidence of acute intracranial abnormality. No significant scalp swelling or calvarial fracture. 2. Postsurgical changes from prior right frontal craniotomy and subjacent encephalomalacia related to patient's history of prior tumoral resection. 3. Chronic microvascular angiopathy and parenchymal volume loss. 4. Remote appearing endplate deformities involving the inferior endplates C4, C6 as well as the superior endplates T2-T3. Additional endplate deformity at T5 is new from comparison CT chest 04/01/2020 but with a more subacute to chronic appearance though could correlate for point tenderness. 5. No definitively acute fracture or traumatic listhesis of the cervical spine. 6. Multilevel degenerative changes of the cervical spine as described above. 7. Some interlobular septal thickening and dependent predominant ground-glass, some of which is likely atelectatic though superimposed edematous features could have a similar appearance. 8. Aortic Atherosclerosis (ICD10-I70.0). Electronically Signed: By: Lovena Le M.D. On: 08/17/2020 02:41   CT Cervical Spine Wo Contrast  Addendum Date: 08/17/2020   ADDENDUM REPORT: 08/17/2020 02:53 ADDENDUM: Additional impression point: 2.1 cm hypoattenuating nodule in the left  thyroid lobe. In the setting of significant comorbidities or limited life expectancy, no follow-up recommended (ref: J Am Coll Radiol. 2015 Feb;12(2): 143-50). Electronically Signed   By:  Lovena Le M.D.   On: 08/17/2020 02:53   Result Date: 08/17/2020 CLINICAL DATA:  Fall, low back and neck pain EXAM: CT HEAD WITHOUT CONTRAST CT CERVICAL SPINE WITHOUT CONTRAST TECHNIQUE: Multidetector CT imaging of the head and cervical spine was performed following the standard protocol without intravenous contrast. Multiplanar CT image reconstructions of the cervical spine were also generated. COMPARISON:  CT head and cervical spine 04/21/2020, CT a chest 04/01/2020 FINDINGS: CT HEAD FINDINGS Brain: Postsurgical changes from prior right frontal craniotomy and subjacent encephalomalacia related to patient's history of prior tumoral resection. Stable appearance from comparison prior. Additional small hypoattenuating foci in the bilateral basal ganglia likely reflecting sequela of prior lacunar type infarcts. No evidence of acute infarction, hemorrhage, hydrocephalus, extra-axial collection, visible mass lesion or mass effect. Diffuse prominence of the ventricles, cisterns and sulci compatible with parenchymal volume loss. Patchy areas of white matter hypoattenuation are most compatible with chronic microvascular angiopathy. Vascular: Atherosclerotic calcification of the carotid siphons. No hyperdense vessel. Skull: Prior right frontal craniotomy. No significant scalp swelling. No calvarial fracture or other acute or worrisome osseous abnormality. Sinuses/Orbits: Paranasal sinuses and mastoid air cells are predominantly clear. Orbital structures are unremarkable aside from prior lens extractions. Other: None. CT CERVICAL SPINE FINDINGS Alignment: Exaggeration of the normal cervical lordosis, similar to prior exam. Stepwise retrolisthesis C2-C5 with anterolisthesis C5 on C6 and C6 on C7 is unchanged from priors and likely related to underlying degenerative changes. Skull base and vertebrae: The osseous structures appear diffusely demineralized which [place] limit detection of small or nondisplaced fractures. No  visible skull base fracture. Unchanged remote appearing endplate deformities involving the inferior endplates C4, C6 as well as the superior endplates T2-T3. Additional endplate deformity at T5 is new from comparison CT chest 04/01/2020 but with a more subacute to chronic appearance. No other acute vertebral compression deformities, fracture or acute osseous abnormality is seen. Minimal calcific pannus formation posterior to the dens, nonspecific but can be seen with rheumatoid arthropathy or CPPD arthropathy. Soft tissues and spinal canal: No pre or paravertebral fluid or swelling. No visible canal hematoma. Airways patent. Cervical carotid atherosclerosis. Disc levels: No significant central canal or foraminal stenosis identified within the imaged levels of the spine. Larger disc osteophyte complexes are present C3-4 C5-6 and C6-7 resulting in at most mild canal stenoses. Additional uncinate spurring and facet hypertrophic changes result in mild-to-moderate multilevel neural foraminal narrowing as well. Upper chest: Biapical pleuroparenchymal scarring. Calcifications seen in the aortic arch and proximal great vessels. Some interlobular septal thickening and dependent predominant ground-glass, some of which is likely atelectatic though superimposed edematous features could have a similar appearance. Other: 2.1 cm hypoattenuating nodule in the left thyroid lobe, similar to prior. IMPRESSION: 1. No evidence of acute intracranial abnormality. No significant scalp swelling or calvarial fracture. 2. Postsurgical changes from prior right frontal craniotomy and subjacent encephalomalacia related to patient's history of prior tumoral resection. 3. Chronic microvascular angiopathy and parenchymal volume loss. 4. Remote appearing endplate deformities involving the inferior endplates C4, C6 as well as the superior endplates T2-T3. Additional endplate deformity at T5 is new from comparison CT chest 04/01/2020 but with a more  subacute to chronic appearance though could correlate for point tenderness. 5. No definitively acute fracture or traumatic listhesis of the cervical spine. 6. Multilevel degenerative changes of the  cervical spine as described above. 7. Some interlobular septal thickening and dependent predominant ground-glass, some of which is likely atelectatic though superimposed edematous features could have a similar appearance. 8. Aortic Atherosclerosis (ICD10-I70.0). Electronically Signed: By: Lovena Le M.D. On: 08/17/2020 02:41   CT Lumbar Spine Wo Contrast  Result Date: 08/17/2020 CLINICAL DATA:  Fall with low back pain EXAM: CT LUMBAR SPINE WITHOUT CONTRAST TECHNIQUE: Multidetector CT imaging of the lumbar spine was performed without intravenous contrast administration. Multiplanar CT image reconstructions were also generated. COMPARISON:  None. FINDINGS: Segmentation: 5 lumbar type vertebrae. Alignment: Grade 1 L2-3 anterolisthesis. Vertebrae: Chronic compression fracture of T12 with approximately 75% height loss. There is vertebral augmentation cement. Paraspinal and other soft tissues: Calcific aortic atherosclerosis. Disc levels: L1-2: No disc herniation or stenosis. L2-3: Moderate facet hypertrophy with grade 1 anterolisthesis. Mild spinal canal stenosis. L3-4: Moderate facet hypertrophy and small disc bulge. No spinal canal or neural foraminal stenosis. L4-5: Moderate right neural foraminal stenosis due to combination of right extraforaminal osteophyte and facet hypertrophy. L5-S1: Small disc bulge. Mild left foraminal stenosis. Mild facet hypertrophy. IMPRESSION: 1. No acute abnormality of the lumbar spine. 2. Chronic T12 compression fracture with approximately 75% height loss and vertebral augmentation cement. 3. Moderate right L4-5 neural foraminal stenosis. Aortic Atherosclerosis (ICD10-I70.0). Electronically Signed   By: Ulyses Jarred M.D.   On: 08/17/2020 02:50   MR CERVICAL SPINE WO CONTRAST  Result  Date: 08/17/2020 CLINICAL DATA:  Myelopathy, acute or progressive. Additional history provided: Fall at home. Back pain. EXAM: MRI CERVICAL SPINE WITHOUT CONTRAST TECHNIQUE: Multiplanar, multisequence MR imaging of the cervical spine was performed. No intravenous contrast was administered. COMPARISON:  Prior CT examinations of the cervical spine 08/17/2020 and earlier. FINDINGS: Intermittently motion degraded examination. Most notably, there is moderate motion degradation of the axial T2 GRE sequence. Alignment: Trace multilevel spondylolisthesis. Vertebrae: Cervical vertebral body height is maintained. Mild chronic T1, T2 and T3 superior endplate compression deformities. Trace degenerative edema along the T1 superior endplate anteriorly. Elsewhere, no significant marrow edema or focal suspicious osseous lesion is identified. Cord: No spinal cord signal abnormality is identified. Posterior Fossa, vertebral arteries, paraspinal tissues: No abnormality identified within included portions of the posterior fossa. Flow voids preserved within the imaged cervical vertebral arteries. Paraspinal soft tissues within normal limits. Mild STIR hyperintense signal within the C3-C4 interspinous space, which Marzella be degenerative or [place] reflect interspinous ligamentous injury. Disc levels: Multilevel disc degeneration. Most notably, there is moderate disc degeneration at C4-C5, C5-C6 and C6-C7. C2-C3: Shallow broad-based central disc protrusion. Mild facet arthrosis and ligamentum flavum hypertrophy. No significant spinal canal stenosis or neural foraminal narrowing. C3-C4: Shallow broad-based central disc protrusion. Uncovertebral, facet and ligamentum flavum hypertrophy. Mild relative spinal canal narrowing. No significant foraminal stenosis. C4-C5: Posterior disc osteophyte complex. Uncovertebral, facet and ligamentum flavum hypertrophy. Moderate spinal canal stenosis with contact upon the dorsal and ventral spinal cord. Bilateral  neural foraminal narrowing (severe right, moderate left). C5-C6: Shallow disc bulge. Facet arthrosis. Mild effacement of the ventral thecal sac with mild relative spinal canal narrowing. No spinal cord mass effect. Moderate bilateral neural foraminal narrowing. C6-C7: Posterior disc osteophyte complex. Uncovertebral, facet and ligamentum flavum hypertrophy. Mild relative spinal canal narrowing without spinal cord mass effect. Bilateral neural foraminal narrowing (mild right, moderate/severe left). C7-T1: Minimal disc bulge. Mild facet arthrosis. No significant spinal canal or foraminal stenosis. IMPRESSION: Motion degraded examination. Subtle STIR hyperintense signal within the C3-C4 interspinous space, which [place] be degenerative in etiology or  Wilkinson reflect interspinous ligament injury. Cervical spondylosis as outlined with multilevel spinal canal and neural foraminal narrowing. Spinal canal stenosis is greatest at C4-C5 (moderate in severity) with contact upon the dorsal and ventral spinal cord. Multilevel neural foraminal narrowing greatest bilaterally at C4-C5 (severe right, moderate left), bilaterally at C5-C6 (moderate) and on the left at C6-C7 (moderate/severe). Mild chronic superior endplate compression deformities at T1, T2 and T3. Electronically Signed   By: Kellie Simmering DO   On: 08/17/2020 11:55   MR THORACIC SPINE WO CONTRAST  Result Date: 08/17/2020 CLINICAL DATA:  Osteoarthritis, thoracic spine. Recent falls. Back pain. EXAM: MRI THORACIC SPINE WITHOUT CONTRAST TECHNIQUE: Multiplanar, multisequence MR imaging of the thoracic spine was performed. No intravenous contrast was administered. COMPARISON:  Prior cervical spine CT examinations 04/21/2020 and earlier. Prior thoracic spine MRI 11/29/2015. FINDINGS: Alignment: Exaggerated thoracic kyphosis. Trace T3-T4, T4-T5 grade 1 anterolisthesis. Bony retropulsion at the level of the T12 superior endplate. Vertebrae: Redemonstrated severe T12 vertebral  compression fracture status post vertebral augmentation. New mild T9 vertebral compression fracture with mild edema at this site, acute/subacute. Moderate chronic T8 vertebral compression fracture with slight interval height loss as compared to the prior MRI of 11/29/2015. Redemonstrated mild T7 superior endplate compression deformity. Redemonstrated mild chronic T4 superior endplate compression deformity. Mild T1, T2 and T3 superior endplate compression deformities are new as compared to the prior MRI, but chronic. Mild degenerative edema along the T1 superior endplate anteriorly. T6 and T10 vertebral body hemangiomas. Cord:  No spinal cord signal abnormality is identified. Paraspinal and other soft tissues: No paraspinal mass or collection. Disc levels: At T11-T12, there is moderate disc degeneration. Mild bony retropulsion at the level of the T12 superior endplate. Mild effacement of ventral thecal sac and mild relative spinal canal narrowing without spinal cord mass effect. No significant foraminal stenosis. At T12-L1, mild bony retropulsion at the level of the T12 inferior endplate. This partially effaces the ventral thecal sac and mildly narrows the spinal canal without spinal cord mass effect. Mild right neural foraminal narrowing. Mild degenerative changes elsewhere within the thoracic spine with shallow multilevel disc bulges, but no focal disc herniation, spinal canal stenosis or neural foraminal narrowing. Multilevel nerve root sheath cyst. IMPRESSION: Mild acute/early subacute T9 vertebral compression fracture with mild vertebral body edema. Chronic vertebral compression fractures at T12, T8, T7, T4, T3, T2 and T1, as described. The T1, T2 and T3 compression fractures have developed since the prior MRI of 11/29/2015. Additionally, there has been mild progressive height loss at the T8 compression fracture. As before, bony retropulsion at the T12 level mildly narrows the spinal canal. Unchanged mild  degenerative disease of the thoracic spine without significant degenerative canal stenosis or foraminal narrowing. Electronically Signed   By: Kellie Simmering DO   On: 08/17/2020 12:42   DG Ankle Left Port  Result Date: 08/16/2020 CLINICAL DATA:  Fall, now with pain. EXAM: PORTABLE LEFT ANKLE - 2 VIEW COMPARISON:  None. FINDINGS: Diffusely decreased bone density. No evidence of fracture, dislocation, or joint effusion. Partially visualized old healed tibial shaft fracture. Cortical irregularity of the first metatarsal is partially visualized and could represent an old healed fracture. Plantar calcaneal spur. No evidence of severe arthropathy. No aggressive appearing focal bone abnormality. Soft tissues are unremarkable. IMPRESSION: No acute displaced fracture or dislocation in a patient with diffusely decreased bone density. Electronically Signed   By: Iven Finn M.D.   On: 08/16/2020 23:56   DG HIP UNILAT WITH PELVIS 2-3  VIEWS LEFT  Result Date: 08/16/2020 CLINICAL DATA:  Found down, fell EXAM: DG HIP (WITH OR WITHOUT PELVIS) 2-3V LEFT COMPARISON:  None. FINDINGS: Frontal view of the pelvis as well as frontal and cross-table lateral views of the left hip are obtained. Right hip prosthesis is partially visualized and unremarkable. The left hip is unremarkable with no fracture, subluxation, or dislocation. Extensive spondylosis of the lower lumbar spine. Soft tissues are unremarkable. IMPRESSION: 1. No acute displaced fracture. 2. Unremarkable right hip arthroplasty. Electronically Signed   By: Randa Ngo M.D.   On: 08/16/2020 23:22        Scheduled Meds: . cholecalciferol  5,000 Units Oral Daily  . dextrose  1 Tube Oral Once  . enoxaparin (LOVENOX) injection  40 mg Subcutaneous Q24H  . feeding supplement (GLUCERNA SHAKE)  237 mL Oral Q24H  . insulin glargine  10 Units Subcutaneous QHS  . levothyroxine  75 mcg Oral QAC breakfast  . lidocaine  1 patch Transdermal Q24H  . pantoprazole  40  mg Oral Q0600  . primidone  100 mg Oral BID  . trandolapril  2 mg Oral Daily  . verapamil  240 mg Oral Daily   Continuous Infusions: . sodium chloride 100 mL/hr at 08/17/20 0532     LOS: 0 days     Cordelia Poche, MD [facility] 08/17/2020, 2:03 PM  If 7PM-7AM, please contact night-coverage [email or web address]

## 2020-08-17 NOTE — ED Notes (Signed)
Pt transport to CT  

## 2020-08-17 NOTE — ED Notes (Signed)
Pt given one full cup of orange juice per CBG of 55. Dr. Christy Gentles made aware

## 2020-08-18 DIAGNOSIS — G40909 Epilepsy, unspecified, not intractable, without status epilepticus: Secondary | ICD-10-CM

## 2020-08-18 DIAGNOSIS — S22070A Wedge compression fracture of T9-T10 vertebra, initial encounter for closed fracture: Secondary | ICD-10-CM

## 2020-08-18 LAB — GLUCOSE, CAPILLARY: Glucose-Capillary: 124 mg/dL — ABNORMAL HIGH (ref 70–99)

## 2020-08-18 MED ORDER — PHENYTOIN SODIUM EXTENDED 100 MG PO CAPS
200.0000 mg | ORAL_CAPSULE | Freq: Every day | ORAL | Status: DC
  Administered 2020-08-18 – 2020-08-21 (×3): 200 mg via ORAL
  Filled 2020-08-18 (×5): qty 2

## 2020-08-18 MED ORDER — OXYCODONE-ACETAMINOPHEN 5-325 MG PO TABS
1.0000 | ORAL_TABLET | ORAL | Status: DC | PRN
  Administered 2020-08-19: 2 via ORAL
  Filled 2020-08-18: qty 2

## 2020-08-18 MED ORDER — PHENYTOIN SODIUM EXTENDED 30 MG PO CAPS
30.0000 mg | ORAL_CAPSULE | Freq: Every day | ORAL | Status: DC
  Administered 2020-08-18 – 2020-08-21 (×4): 30 mg via ORAL
  Filled 2020-08-18 (×5): qty 1

## 2020-08-18 MED ORDER — WHITE PETROLATUM EX OINT
TOPICAL_OINTMENT | CUTANEOUS | Status: AC
  Administered 2020-08-18: 0.2
  Filled 2020-08-18: qty 28.35

## 2020-08-18 MED ORDER — PHENYTOIN SODIUM EXTENDED 100 MG PO CAPS
100.0000 mg | ORAL_CAPSULE | Freq: Every day | ORAL | Status: DC
  Administered 2020-08-19 – 2020-08-21 (×3): 100 mg via ORAL
  Filled 2020-08-18 (×4): qty 1

## 2020-08-18 MED ORDER — TRAMADOL HCL 50 MG PO TABS
50.0000 mg | ORAL_TABLET | Freq: Two times a day (BID) | ORAL | Status: DC | PRN
  Administered 2020-08-18 – 2020-08-19 (×2): 50 mg via ORAL
  Filled 2020-08-18 (×2): qty 1

## 2020-08-18 NOTE — Evaluation (Signed)
Physical Therapy Evaluation Patient Details Name: [name] MRN: [id number] DOB: [birth year] Today's Date: 08/18/2020   History of Present Illness  Patient is a 87 y/o female who presents on 08/17/20 with back pain s/p fall at home. Found to have acute compression fx T9, Dilantin toxicity and possibly UTI. PMH includes IDDM2, HTN, left tib/fib, breast ca, brain tumor s/p surgery complicated with seizure, colon ca, Rt THA, chronic hyponatremia.  Clinical Impression  Patient presents with pain, generalized weakness, impaired balance and impaired mobility s/p above. Pt lives at home alone and has a caregiver come in 8 hours daily to assist with ADLs/IADLs. Sister stays with her on the weekend. Reports lots of falls at home. Currently working with HHPT/OT but not sure if she can tolerate it due to back pain. Education on back precautions, importance of mobility and positioning for home. Pt declines SNF but might be able to increase caregiver hours at home- highly encouraged. Needs to work on how to get up off the floor through HHPT due to frequent falls. Encouraged trying to plan day and mobility around caregivers as to not get up on her own. Will follow acutely to maximize independence and mobility prior to return home.    Follow Up Recommendations Home health PT;Supervision for mobility/OOB;Other (comment) (increase aide hours)    Equipment Recommendations  Wheelchair (measurements PT);Wheelchair cushion (measurements PT)    Recommendations for Other Services       Precautions / Restrictions Precautions Precautions: Fall Precaution Comments: lots of falls at home Restrictions Weight Bearing Restrictions: No      Mobility  Bed Mobility Overal bed mobility: Needs Assistance Bed Mobility: Rolling;Sidelying to Sit Rolling: Supervision Sidelying to sit: Supervision;HOB elevated       General bed mobility comments: increased time and use of rail for support. Cues for log roll to protect  spine.    Transfers Overall transfer level: Needs assistance Equipment used: Rolling walker (2 wheeled) Transfers: Sit to/from Stand Sit to Stand: Min guard         General transfer comment: Min guard for safety to stand from EOB x1, flexed posture. Transferred to chair.  Ambulation/Gait Ambulation/Gait assistance: Min assist Gait Distance (Feet): 5 Feet Assistive device: Rolling walker (2 wheeled) Gait Pattern/deviations: Trunk flexed;Step-through pattern;Decreased step length - right;Decreased step length - left Gait velocity: decreased   General Gait Details: Slow, mildly unsteady gait with RW for support; limited due to back pain when upright.  Stairs            Wheelchair Mobility    Modified Rankin (Stroke Patients Only)       Balance Overall balance assessment: Needs assistance;History of Falls Sitting-balance support: Feet supported;Single extremity supported Sitting balance-Leahy Scale: Fair Sitting balance - Comments: supervision for safety.   Standing balance support: During functional activity Standing balance-Leahy Scale: Poor Standing balance comment: Requires UE support in standing.                             Pertinent Vitals/Pain Pain Assessment: Faces Faces Pain Scale: Hurts a little bit Pain Location: back and throat Pain Descriptors / Indicators: Sore Pain Intervention(s): Monitored during session;Repositioned    Home Living Family/patient expects to be discharged to:: Private residence Living Arrangements: Alone Available Help at Discharge: Personal care attendant (8 hours/day) Type of Home: House Home Access: Stairs to enter Entrance Stairs-Rails: Right Entrance Stairs-Number of Steps: 4 Home Layout: One level Home Equipment: Environmental consultant -  standard;Toilet riser;Grab bars - toilet;Grab bars - tub/shower;Shower seat;Bedside commode;Walker - 2 wheels      Prior Function Level of Independence: Needs assistance   Gait /  Transfers Assistance Needed: Uses RW for ambulation. Reports lots of falls at home.  ADL's / Homemaking Assistance Needed: requires assist for ADLs from caregivers. Sister stays with her on the weekends.  Comments: Currently getting HHPT, but reports she  might not be able to tolerate it due to back pain.     Hand Dominance   Dominant Hand: Right    Extremity/Trunk Assessment   Upper Extremity Assessment Upper Extremity Assessment: Defer to OT evaluation    Lower Extremity Assessment Lower Extremity Assessment: Generalized weakness (but functional; sensation WFLs BLEs)    Cervical / Trunk Assessment Cervical / Trunk Assessment: Kyphotic  Communication   Communication: No difficulties  Cognition Arousal/Alertness: Awake/alert Behavior During Therapy: WFL for tasks assessed/performed Overall Cognitive Status: No family/caregiver present to determine baseline cognitive functioning                                 General Comments: Able to follow simple commands, repeats self at times. WFL for basic mobility tasks.      General Comments General comments (skin integrity, edema, etc.): VSS on RA.    Exercises     Assessment/Plan    PT Assessment Patient needs continued PT services  PT Problem List Decreased strength;Decreased mobility;Pain;Decreased balance;Decreased activity tolerance       PT Treatment Interventions Therapeutic exercise;Gait training;Wheelchair mobility training;Balance training;Patient/family education;Therapeutic activities;Functional mobility training;Stair training;DME instruction    PT Goals (Current goals can be found in the Care Plan section)  Acute Rehab PT Goals Patient Stated Goal: to stop falling and improve pain PT Goal Formulation: With patient Time For Goal Achievement: 09/01/20 Potential to Achieve Goals: Good    Frequency Min 3X/week   Barriers to discharge Decreased caregiver support;Inaccessible home  environment stairs to enter home    Co-evaluation               AM-PAC PT "6 Clicks" Mobility  Outcome Measure Help needed turning from your back to your side while in a flat bed without using bedrails?: A Little Help needed moving from lying on your back to sitting on the side of a flat bed without using bedrails?: A Little Help needed moving to and from a bed to a chair (including a wheelchair)?: A Little Help needed standing up from a chair using your arms (e.g., wheelchair or bedside chair)?: A Little Help needed to walk in hospital room?: A Little Help needed climbing 3-5 steps with a railing? : A Lot 6 Click Score: 17    End of Session Equipment Utilized During Treatment: Gait belt Activity Tolerance: Patient limited by pain Patient left: in chair;with call bell/phone within reach;with chair alarm set Nurse Communication: Mobility status PT Visit Diagnosis: Pain;Unsteadiness on feet (R26.81);Repeated falls (R29.6) Pain - part of body:  (back)    Time: 1740-8144 PT Time Calculation (min) (ACUTE ONLY): 30 min   Charges:   PT Evaluation $PT Eval Moderate Complexity: 1 Mod PT Treatments $Therapeutic Activity: 8-22 mins        Marisa Severin, PT, DPT Acute Rehabilitation Services Pager [phone] Office [phone]      [name] 08/18/2020, 8:52 AM

## 2020-08-18 NOTE — Progress Notes (Signed)
PROGRESS NOTE    [name]  AOZ:[id number] DOB: [birth year] DOA: 08/16/2020 PCP: Bartholome Bill, MD   Brief Narrative: [name] is a 87 y.o. female with a history of seizures, diabetes, hypothyroidism, hypertension, depression. Patient presented secondary to a fall and resultant back pain, found to have dilantin toxicity and evidence of acute compression fracture. Pain control regimen initiated.   Assessment & Plan:   Principal Problem:   Dilantin toxicity Active Problems:   Essential hypertension   Cancer of central portion of female breast ([place])   Seizure disorder ([place])   Acquired hypothyroidism   History of breast cancer   Dilantin toxicity Patient dispenses medication on her own for her daily regimens. Phenytoin level of 21.9 on admission. No new changes prescribed dose. Last phenytoin level of 6.4 (low) on 03/31/20. Patient states that she has not taken more phenytoin than prescribed. Repeat phenytoin level of 13.6 on 4/17. -Neurology consult for dosing  Possible UTI Urine significant for nitrites and bacteria. No specific symptoms. Fosfomycin given on admission. Urine culture pending.  Back pain MRI C-spine and T-spine obtained. Significant for multiple chronic compression fractures but also a new T9 compression fracture. Neurosurgery consulted and recommended pain management. -Continue analgesics prn -Add home Tramadol as patient states she tolerates that well  Thyroid nodule -Outpatient follow-up  Diabetes mellitus, type 2 -Continue Lantus and SSI  Seizure disorder Patient is on phenytoin as mentioned above which is being held secondary to above. No recent seizures. Patient is on Tramadol as an outpatient.  Primary hypertension -Continue trandolapril and verapamil  Tremors -Continue primidone  Hyponatremia Mild. -Continue IV fluids  Hypothyroidism -Continue synthroid  DVT prophylaxis: Lovenox Code Status:   Code Status: Full Code Family  Communication: None at bedside Disposition Plan: Discharge home vs SNF likely in 1 day pending improvement of pain, neurology recommendations, OT recommendations   Consultants:   Neurosurgery  Neurology  Procedures:   None  Antimicrobials:  Fosfomycin    Subjective: Some back pain. Does not like food delivered. No other concerns  Objective: Vitals:   08/17/20 1202 08/17/20 1811 08/17/20 2252 08/18/20 0453  BP: 121/76 140/62 110/65 (!) 148/71  Pulse: 74 90 81 79  Resp: 16 16 20 18   Temp: 98.3 F (36.8 C) 99.6 F (37.6 C) 98.8 F (37.1 C) 99.1 F (37.3 C)  TempSrc: Oral Oral Oral Oral  SpO2: 100% 96% 95% 97%  Weight:      Height:        Intake/Output Summary (Last 24 hours) at 08/18/2020 0957 Last data filed at 08/18/2020 0400 Gross per 24 hour  Intake 962.85 ml  Output 1900 ml  Net -937.15 ml   Filed Weights   08/16/20 2200 08/17/20 0515  Weight: 62.1 kg 61.5 kg    Examination:  General exam: Appears calm and comfortable Respiratory system: Clear to auscultation. Respiratory effort normal. Cardiovascular system: S1 & S2 heard, RRR. No murmurs, rubs, gallops or clicks. Gastrointestinal system: Abdomen is nondistended, soft and nontender. No organomegaly or masses felt. Normal bowel sounds heard. Central nervous system: Alert and oriented. No focal neurological deficits. No tremor Musculoskeletal: No edema. No calf tenderness Skin: No cyanosis. No rashes Psychiatry: Judgement and insight appear normal. Mood & affect appropriate.    Data Reviewed: I have personally reviewed following labs and imaging studies  CBC Lab Results  Component Value Date   WBC 7.1 08/17/2020   RBC 3.68 (L) 08/17/2020   HGB 12.0 08/17/2020   HCT  35.8 (L) 08/17/2020   MCV 97.3 08/17/2020   MCH 32.6 08/17/2020   PLT 251 08/17/2020   MCHC 33.5 08/17/2020   RDW 13.2 08/17/2020   LYMPHSABS 1.0 08/17/2020   MONOABS 0.4 08/17/2020   EOSABS 0.1 08/17/2020   BASOSABS 0.0  45/80/9983     Last metabolic panel Lab Results  Component Value Date   NA 133 (L) 08/17/2020   K 4.8 08/17/2020   CL 103 08/17/2020   CO2 22 08/17/2020   BUN 11 08/17/2020   CREATININE 0.72 08/17/2020   GLUCOSE 86 08/17/2020   GFRNONAA >60 08/17/2020   GFRAA >60 01/02/2020   CALCIUM 9.0 08/17/2020   PHOS 3.7 05/03/2007   PROT 6.3 (L) 08/17/2020   ALBUMIN 3.9 08/17/2020   LABGLOB 1.7 08/31/2017   AGRATIO 2.4 (H) 08/31/2017   BILITOT 0.8 08/17/2020   ALKPHOS 95 08/17/2020   AST 26 08/17/2020   ALT 23 08/17/2020   ANIONGAP 8 08/17/2020    CBG (last 3)  Recent Labs    08/17/20 1108 08/17/20 2110 08/18/20 0610  GLUCAP 123* 202* 124*     GFR: Estimated Creatinine Clearance: 47.2 mL/min (by C-G formula based on SCr of 0.72 mg/dL).  Coagulation Profile: No results for input(s): INR, PROTIME in the last 168 hours.  Recent Results (from the past 240 hour(s))  Culture, Urine     Status: Abnormal (Preliminary result)   Collection Time: 08/17/20  4:47 AM   Specimen: Urine, Random  Result Value Ref Range Status   Specimen Description URINE, RANDOM  Final   Special Requests NONE  Final   Culture (A)  Final    >=100,000 COLONIES/mL GRAM NEGATIVE RODS SUSCEPTIBILITIES TO FOLLOW Performed at [facility], [address], [place], [place] [address]    Report Status PENDING  Incomplete  SARS CORONAVIRUS 2 (TAT 6-24 HRS) Nasopharyngeal Nasopharyngeal Swab     Status: None   Collection Time: 08/17/20  4:57 AM   Specimen: Nasopharyngeal Swab  Result Value Ref Range Status   SARS Coronavirus 2 NEGATIVE NEGATIVE Final    Comment: (NOTE) SARS-CoV-2 target nucleic acids are NOT DETECTED.  The SARS-CoV-2 RNA is generally detectable in upper and lower respiratory specimens during the acute phase of infection. Negative results do not preclude SARS-CoV-2 infection, do not rule out co-infections with other pathogens, and should not be used as the sole basis for treatment  or other patient management decisions. Negative results must be combined with clinical observations, patient history, and epidemiological information. The expected result is Negative.  Fact Sheet for Patients: SugarRoll.be  Fact Sheet for Healthcare Providers: https://www.woods-mathews.com/  This test is not yet approved or cleared by the [place] FDA and  has been authorized for detection and/or diagnosis of SARS-CoV-2 by FDA under an Emergency Use Authorization (EUA). This EUA will remain  in effect (meaning this test can be used) for the duration of the COVID-19 declaration under Se ction 564(b)(1) of the Act, 21 U.S.C. section 360bbb-3(b)(1), unless the authorization is terminated or revoked sooner.  Performed at [facility], [place] [address] [place], [place] [address]         Radiology Studies: DG Chest 1 View  Result Date: 08/16/2020 CLINICAL DATA:  Found down, fell EXAM: CHEST  1 VIEW COMPARISON:  06/04/2020 FINDINGS: The heart size and mediastinal contours are within normal limits. Both lungs are clear. The visualized skeletal structures are unremarkable. IMPRESSION: No active disease. Electronically Signed   By: Randa Ngo  M.D.   On: 08/16/2020 23:19   CT Head Wo Contrast  Addendum Date: 08/17/2020   ADDENDUM REPORT: 08/17/2020 02:53 ADDENDUM: Additional impression point: 2.1 cm hypoattenuating nodule in the left thyroid lobe. In the setting of significant comorbidities or limited life expectancy, no follow-up recommended (ref: J Am Coll Radiol. 2015 Feb;12(2): 143-50). Electronically Signed   By: Lovena Le M.D.   On: 08/17/2020 02:53   Result Date: 08/17/2020 CLINICAL DATA:  Fall, low back and neck pain EXAM: CT HEAD WITHOUT CONTRAST CT CERVICAL SPINE WITHOUT CONTRAST TECHNIQUE: Multidetector CT imaging of the head and cervical spine was performed following the standard protocol without intravenous contrast.  Multiplanar CT image reconstructions of the cervical spine were also generated. COMPARISON:  CT head and cervical spine 04/21/2020, CT a chest 04/01/2020 FINDINGS: CT HEAD FINDINGS Brain: Postsurgical changes from prior right frontal craniotomy and subjacent encephalomalacia related to patient's history of prior tumoral resection. Stable appearance from comparison prior. Additional small hypoattenuating foci in the bilateral basal ganglia likely reflecting sequela of prior lacunar type infarcts. No evidence of acute infarction, hemorrhage, hydrocephalus, extra-axial collection, visible mass lesion or mass effect. Diffuse prominence of the ventricles, cisterns and sulci compatible with parenchymal volume loss. Patchy areas of white matter hypoattenuation are most compatible with chronic microvascular angiopathy. Vascular: Atherosclerotic calcification of the carotid siphons. No hyperdense vessel. Skull: Prior right frontal craniotomy. No significant scalp swelling. No calvarial fracture or other acute or worrisome osseous abnormality. Sinuses/Orbits: Paranasal sinuses and mastoid air cells are predominantly clear. Orbital structures are unremarkable aside from prior lens extractions. Other: None. CT CERVICAL SPINE FINDINGS Alignment: Exaggeration of the normal cervical lordosis, similar to prior exam. Stepwise retrolisthesis C2-C5 with anterolisthesis C5 on C6 and C6 on C7 is unchanged from priors and likely related to underlying degenerative changes. Skull base and vertebrae: The osseous structures appear diffusely demineralized which [place]ovic limit detection of small or nondisplaced fractures. No visible skull base fracture. Unchanged remote appearing endplate deformities involving the inferior endplates C4, C6 as well as the superior endplates T2-T3. Additional endplate deformity at T5 is new from comparison CT chest 04/01/2020 but with a more subacute to chronic appearance. No other acute vertebral compression  deformities, fracture or acute osseous abnormality is seen. Minimal calcific pannus formation posterior to the dens, nonspecific but can be seen with rheumatoid arthropathy or CPPD arthropathy. Soft tissues and spinal canal: No pre or paravertebral fluid or swelling. No visible canal hematoma. Airways patent. Cervical carotid atherosclerosis. Disc levels: No significant central canal or foraminal stenosis identified within the imaged levels of the spine. Larger disc osteophyte complexes are present C3-4 C5-6 and C6-7 resulting in at most mild canal stenoses. Additional uncinate spurring and facet hypertrophic changes result in mild-to-moderate multilevel neural foraminal narrowing as well. Upper chest: Biapical pleuroparenchymal scarring. Calcifications seen in the aortic arch and proximal great vessels. Some interlobular septal thickening and dependent predominant ground-glass, some of which is likely atelectatic though superimposed edematous features could have a similar appearance. Other: 2.1 cm hypoattenuating nodule in the left thyroid lobe, similar to prior. IMPRESSION: 1. No evidence of acute intracranial abnormality. No significant scalp swelling or calvarial fracture. 2. Postsurgical changes from prior right frontal craniotomy and subjacent encephalomalacia related to patient's history of prior tumoral resection. 3. Chronic microvascular angiopathy and parenchymal volume loss. 4. Remote appearing endplate deformities involving the inferior endplates C4, C6 as well as the superior endplates T2-T3. Additional endplate deformity at T5 is new from comparison CT chest 04/01/2020  but with a more subacute to chronic appearance though could correlate for point tenderness. 5. No definitively acute fracture or traumatic listhesis of the cervical spine. 6. Multilevel degenerative changes of the cervical spine as described above. 7. Some interlobular septal thickening and dependent predominant ground-glass, some of  which is likely atelectatic though superimposed edematous features could have a similar appearance. 8. Aortic Atherosclerosis (ICD10-I70.0). Electronically Signed: By: Lovena Le M.D. On: 08/17/2020 02:41   CT Cervical Spine Wo Contrast  Addendum Date: 08/17/2020   ADDENDUM REPORT: 08/17/2020 02:53 ADDENDUM: Additional impression point: 2.1 cm hypoattenuating nodule in the left thyroid lobe. In the setting of significant comorbidities or limited life expectancy, no follow-up recommended (ref: J Am Coll Radiol. 2015 Feb;12(2): 143-50). Electronically Signed   By: Lovena Le M.D.   On: 08/17/2020 02:53   Result Date: 08/17/2020 CLINICAL DATA:  Fall, low back and neck pain EXAM: CT HEAD WITHOUT CONTRAST CT CERVICAL SPINE WITHOUT CONTRAST TECHNIQUE: Multidetector CT imaging of the head and cervical spine was performed following the standard protocol without intravenous contrast. Multiplanar CT image reconstructions of the cervical spine were also generated. COMPARISON:  CT head and cervical spine 04/21/2020, CT a chest 04/01/2020 FINDINGS: CT HEAD FINDINGS Brain: Postsurgical changes from prior right frontal craniotomy and subjacent encephalomalacia related to patient's history of prior tumoral resection. Stable appearance from comparison prior. Additional small hypoattenuating foci in the bilateral basal ganglia likely reflecting sequela of prior lacunar type infarcts. No evidence of acute infarction, hemorrhage, hydrocephalus, extra-axial collection, visible mass lesion or mass effect. Diffuse prominence of the ventricles, cisterns and sulci compatible with parenchymal volume loss. Patchy areas of white matter hypoattenuation are most compatible with chronic microvascular angiopathy. Vascular: Atherosclerotic calcification of the carotid siphons. No hyperdense vessel. Skull: Prior right frontal craniotomy. No significant scalp swelling. No calvarial fracture or other acute or worrisome osseous abnormality.  Sinuses/Orbits: Paranasal sinuses and mastoid air cells are predominantly clear. Orbital structures are unremarkable aside from prior lens extractions. Other: None. CT CERVICAL SPINE FINDINGS Alignment: Exaggeration of the normal cervical lordosis, similar to prior exam. Stepwise retrolisthesis C2-C5 with anterolisthesis C5 on C6 and C6 on C7 is unchanged from priors and likely related to underlying degenerative changes. Skull base and vertebrae: The osseous structures appear diffusely demineralized which Brandy limit detection of small or nondisplaced fractures. No visible skull base fracture. Unchanged remote appearing endplate deformities involving the inferior endplates C4, C6 as well as the superior endplates T2-T3. Additional endplate deformity at T5 is new from comparison CT chest 04/01/2020 but with a more subacute to chronic appearance. No other acute vertebral compression deformities, fracture or acute osseous abnormality is seen. Minimal calcific pannus formation posterior to the dens, nonspecific but can be seen with rheumatoid arthropathy or CPPD arthropathy. Soft tissues and spinal canal: No pre or paravertebral fluid or swelling. No visible canal hematoma. Airways patent. Cervical carotid atherosclerosis. Disc levels: No significant central canal or foraminal stenosis identified within the imaged levels of the spine. Larger disc osteophyte complexes are present C3-4 C5-6 and C6-7 resulting in at most mild canal stenoses. Additional uncinate spurring and facet hypertrophic changes result in mild-to-moderate multilevel neural foraminal narrowing as well. Upper chest: Biapical pleuroparenchymal scarring. Calcifications seen in the aortic arch and proximal great vessels. Some interlobular septal thickening and dependent predominant ground-glass, some of which is likely atelectatic though superimposed edematous features could have a similar appearance. Other: 2.1 cm hypoattenuating nodule in the left thyroid  lobe, similar to prior. IMPRESSION:  1. No evidence of acute intracranial abnormality. No significant scalp swelling or calvarial fracture. 2. Postsurgical changes from prior right frontal craniotomy and subjacent encephalomalacia related to patient's history of prior tumoral resection. 3. Chronic microvascular angiopathy and parenchymal volume loss. 4. Remote appearing endplate deformities involving the inferior endplates C4, C6 as well as the superior endplates T2-T3. Additional endplate deformity at T5 is new from comparison CT chest 04/01/2020 but with a more subacute to chronic appearance though could correlate for point tenderness. 5. No definitively acute fracture or traumatic listhesis of the cervical spine. 6. Multilevel degenerative changes of the cervical spine as described above. 7. Some interlobular septal thickening and dependent predominant ground-glass, some of which is likely atelectatic though superimposed edematous features could have a similar appearance. 8. Aortic Atherosclerosis (ICD10-I70.0). Electronically Signed: By: Lovena Le M.D. On: 08/17/2020 02:41   CT Lumbar Spine Wo Contrast  Result Date: 08/17/2020 CLINICAL DATA:  Fall with low back pain EXAM: CT LUMBAR SPINE WITHOUT CONTRAST TECHNIQUE: Multidetector CT imaging of the lumbar spine was performed without intravenous contrast administration. Multiplanar CT image reconstructions were also generated. COMPARISON:  None. FINDINGS: Segmentation: 5 lumbar type vertebrae. Alignment: Grade 1 L2-3 anterolisthesis. Vertebrae: Chronic compression fracture of T12 with approximately 75% height loss. There is vertebral augmentation cement. Paraspinal and other soft tissues: Calcific aortic atherosclerosis. Disc levels: L1-2: No disc herniation or stenosis. L2-3: Moderate facet hypertrophy with grade 1 anterolisthesis. Mild spinal canal stenosis. L3-4: Moderate facet hypertrophy and small disc bulge. No spinal canal or neural foraminal stenosis.  L4-5: Moderate right neural foraminal stenosis due to combination of right extraforaminal osteophyte and facet hypertrophy. L5-S1: Small disc bulge. Mild left foraminal stenosis. Mild facet hypertrophy. IMPRESSION: 1. No acute abnormality of the lumbar spine. 2. Chronic T12 compression fracture with approximately 75% height loss and vertebral augmentation cement. 3. Moderate right L4-5 neural foraminal stenosis. Aortic Atherosclerosis (ICD10-I70.0). Electronically Signed   By: Ulyses Jarred M.D.   On: 08/17/2020 02:50   MR CERVICAL SPINE WO CONTRAST  Result Date: 08/17/2020 CLINICAL DATA:  Myelopathy, acute or progressive. Additional history provided: Fall at home. Back pain. EXAM: MRI CERVICAL SPINE WITHOUT CONTRAST TECHNIQUE: Multiplanar, multisequence MR imaging of the cervical spine was performed. No intravenous contrast was administered. COMPARISON:  Prior CT examinations of the cervical spine 08/17/2020 and earlier. FINDINGS: Intermittently motion degraded examination. Most notably, there is moderate motion degradation of the axial T2 GRE sequence. Alignment: Trace multilevel spondylolisthesis. Vertebrae: Cervical vertebral body height is maintained. Mild chronic T1, T2 and T3 superior endplate compression deformities. Trace degenerative edema along the T1 superior endplate anteriorly. Elsewhere, no significant marrow edema or focal suspicious osseous lesion is identified. Cord: No spinal cord signal abnormality is identified. Posterior Fossa, vertebral arteries, paraspinal tissues: No abnormality identified within included portions of the posterior fossa. Flow voids preserved within the imaged cervical vertebral arteries. Paraspinal soft tissues within normal limits. Mild STIR hyperintense signal within the C3-C4 interspinous space, which John be degenerative or [place] reflect interspinous ligamentous injury. Disc levels: Multilevel disc degeneration. Most notably, there is moderate disc degeneration at  C4-C5, C5-C6 and C6-C7. C2-C3: Shallow broad-based central disc protrusion. Mild facet arthrosis and ligamentum flavum hypertrophy. No significant spinal canal stenosis or neural foraminal narrowing. C3-C4: Shallow broad-based central disc protrusion. Uncovertebral, facet and ligamentum flavum hypertrophy. Mild relative spinal canal narrowing. No significant foraminal stenosis. C4-C5: Posterior disc osteophyte complex. Uncovertebral, facet and ligamentum flavum hypertrophy. Moderate spinal canal stenosis with contact upon the dorsal and  ventral spinal cord. Bilateral neural foraminal narrowing (severe right, moderate left). C5-C6: Shallow disc bulge. Facet arthrosis. Mild effacement of the ventral thecal sac with mild relative spinal canal narrowing. No spinal cord mass effect. Moderate bilateral neural foraminal narrowing. C6-C7: Posterior disc osteophyte complex. Uncovertebral, facet and ligamentum flavum hypertrophy. Mild relative spinal canal narrowing without spinal cord mass effect. Bilateral neural foraminal narrowing (mild right, moderate/severe left). C7-T1: Minimal disc bulge. Mild facet arthrosis. No significant spinal canal or foraminal stenosis. IMPRESSION: Motion degraded examination. Subtle STIR hyperintense signal within the C3-C4 interspinous space, which Robbins be degenerative in etiology or [place] reflect interspinous ligament injury. Cervical spondylosis as outlined with multilevel spinal canal and neural foraminal narrowing. Spinal canal stenosis is greatest at C4-C5 (moderate in severity) with contact upon the dorsal and ventral spinal cord. Multilevel neural foraminal narrowing greatest bilaterally at C4-C5 (severe right, moderate left), bilaterally at C5-C6 (moderate) and on the left at C6-C7 (moderate/severe). Mild chronic superior endplate compression deformities at T1, T2 and T3. Electronically Signed   By: Kellie Simmering DO   On: 08/17/2020 11:55   MR THORACIC SPINE WO CONTRAST  Result Date:  08/17/2020 CLINICAL DATA:  Osteoarthritis, thoracic spine. Recent falls. Back pain. EXAM: MRI THORACIC SPINE WITHOUT CONTRAST TECHNIQUE: Multiplanar, multisequence MR imaging of the thoracic spine was performed. No intravenous contrast was administered. COMPARISON:  Prior cervical spine CT examinations 04/21/2020 and earlier. Prior thoracic spine MRI 11/29/2015. FINDINGS: Alignment: Exaggerated thoracic kyphosis. Trace T3-T4, T4-T5 grade 1 anterolisthesis. Bony retropulsion at the level of the T12 superior endplate. Vertebrae: Redemonstrated severe T12 vertebral compression fracture status post vertebral augmentation. New mild T9 vertebral compression fracture with mild edema at this site, acute/subacute. Moderate chronic T8 vertebral compression fracture with slight interval height loss as compared to the prior MRI of 11/29/2015. Redemonstrated mild T7 superior endplate compression deformity. Redemonstrated mild chronic T4 superior endplate compression deformity. Mild T1, T2 and T3 superior endplate compression deformities are new as compared to the prior MRI, but chronic. Mild degenerative edema along the T1 superior endplate anteriorly. T6 and T10 vertebral body hemangiomas. Cord:  No spinal cord signal abnormality is identified. Paraspinal and other soft tissues: No paraspinal mass or collection. Disc levels: At T11-T12, there is moderate disc degeneration. Mild bony retropulsion at the level of the T12 superior endplate. Mild effacement of ventral thecal sac and mild relative spinal canal narrowing without spinal cord mass effect. No significant foraminal stenosis. At T12-L1, mild bony retropulsion at the level of the T12 inferior endplate. This partially effaces the ventral thecal sac and mildly narrows the spinal canal without spinal cord mass effect. Mild right neural foraminal narrowing. Mild degenerative changes elsewhere within the thoracic spine with shallow multilevel disc bulges, but no focal disc  herniation, spinal canal stenosis or neural foraminal narrowing. Multilevel nerve root sheath cyst. IMPRESSION: Mild acute/early subacute T9 vertebral compression fracture with mild vertebral body edema. Chronic vertebral compression fractures at T12, T8, T7, T4, T3, T2 and T1, as described. The T1, T2 and T3 compression fractures have developed since the prior MRI of 11/29/2015. Additionally, there has been mild progressive height loss at the T8 compression fracture. As before, bony retropulsion at the T12 level mildly narrows the spinal canal. Unchanged mild degenerative disease of the thoracic spine without significant degenerative canal stenosis or foraminal narrowing. Electronically Signed   By: Kellie Simmering DO   On: 08/17/2020 12:42   DG Ankle Left Port  Result Date: 08/16/2020 CLINICAL DATA:  Fall, now  with pain. EXAM: PORTABLE LEFT ANKLE - 2 VIEW COMPARISON:  None. FINDINGS: Diffusely decreased bone density. No evidence of fracture, dislocation, or joint effusion. Partially visualized old healed tibial shaft fracture. Cortical irregularity of the first metatarsal is partially visualized and could represent an old healed fracture. Plantar calcaneal spur. No evidence of severe arthropathy. No aggressive appearing focal bone abnormality. Soft tissues are unremarkable. IMPRESSION: No acute displaced fracture or dislocation in a patient with diffusely decreased bone density. Electronically Signed   By: Iven Finn M.D.   On: 08/16/2020 23:56   DG HIP UNILAT WITH PELVIS 2-3 VIEWS LEFT  Result Date: 08/16/2020 CLINICAL DATA:  Found down, fell EXAM: DG HIP (WITH OR WITHOUT PELVIS) 2-3V LEFT COMPARISON:  None. FINDINGS: Frontal view of the pelvis as well as frontal and cross-table lateral views of the left hip are obtained. Right hip prosthesis is partially visualized and unremarkable. The left hip is unremarkable with no fracture, subluxation, or dislocation. Extensive spondylosis of the lower lumbar  spine. Soft tissues are unremarkable. IMPRESSION: 1. No acute displaced fracture. 2. Unremarkable right hip arthroplasty. Electronically Signed   By: Randa Ngo M.D.   On: 08/16/2020 23:22        Scheduled Meds: . cholecalciferol  5,000 Units Oral Daily  . dextrose  1 Tube Oral Once  . enoxaparin (LOVENOX) injection  40 mg Subcutaneous Q24H  . feeding supplement (GLUCERNA SHAKE)  237 mL Oral Q24H  . insulin glargine  10 Units Subcutaneous QHS  . levothyroxine  75 mcg Oral QAC breakfast  . lidocaine  1 patch Transdermal Q24H  . pantoprazole  40 mg Oral Q0600  . phenytoin  100 mg Oral BID  . primidone  100 mg Oral BID  . trandolapril  2 mg Oral Daily  . verapamil  240 mg Oral Daily   Continuous Infusions:    LOS: 1 day     Cordelia Poche, MD [facility] 08/18/2020, 9:57 AM  If 7PM-7AM, please contact night-coverage [email or web address]

## 2020-08-18 NOTE — Consult Note (Signed)
Neurology Consultation  Reason for Consult: Dilantin Toxicity Referring Physician: Ewing Schlein, MD  CC: Dilantin toxicity  History is obtained from: Patient  HPI: [name] is a 87 y.o. female with a PMHx of depression, seizures, HTN, tremors, IDDM II, remote breast cancer, colon cancer s/p resection, history of brain mass with resection, SDH, DDD, loss of bone density, falls, Vitamin D deficiency, and gait disturbance. She presented on 08/17/20 to ED after a fall with resultant T9 compression fracture and was found to have Dilantin toxicity upon level result. She also has a UTI not on antibiotics per medicine. Patient has a hx of multiple falls with multiple fractures including compression fractures (at T 12, 8, 7, 4, 3, 2, 1), hip, and tibial shaft.   Patient reports this fall occurred when she stood up from bedside commode.   Acute thoracic compression requires no surgical intervention or brace per NSU.  Dilantin level as 21.9 on admission and down to 13.6 today. Patient states she takes her Dilantin as prescribed. She endorses same dose that is on Dr. Jannifer Franklin' chart in February.   Patient endorses sore throat x 2 days. Nausea without vomiting x5 days and heartburn. No rash. No seizure in a long time.   Patient lives alone but has aides from 0900- 1700 hrs daily. They "do everything for me". She uses bedside commode when aides not there. Ambulates with a walker.   In review of latest neurology notes, patient sees Dr. Jannifer Franklin. 2021 visit shows a low Dilantin level on 100mg  bid. Dose was changed by Dr. Jannifer Franklin to 150mg  am and 200mg  pm. She was placed on Primidone for tremors 08/2020.   Neurology asked to consult regarding Dilantin toxicity and help with dosing  ROS: A robust ROS was performed and is negative except for some nausea the last few days, no vomiting, + sore throat for 2 days, and low grade fever. No rash, vision changes, or seizure activity.    Past Medical History:  Diagnosis Date   . Anemia   . Anxiety   . Asthma   . Breast cancer ([place]) 2014   right breast  . Cancer of right breast ([place]) 12/26/12   right breast 12:00 o'clock, DCIS  . Carotid artery disease ([place])   . Carpal tunnel syndrome, bilateral   . Chronic bronchitis ([place])   . Chronic cough   . Chronic facial pain   . Chronic foot pain   . Colon cancer ([place]) dx'd 11/2019  . Complication of anesthesia    Sore jaw; could not chew or move mouth, prolonged sedation  . Convulsions/seizures ([place]) 10/16/2014  . Diabetes mellitus    type 2 niddm x 20 years  . Dyslipidemia   . Ejection fraction   . Gait abnormality 12/04/2019  . GERD (gastroesophageal reflux disease)   . Hammer toe    bilateral  . History of colonic polyps   . History of meningioma   . HTN (hypertension)   . Hx of radiation therapy 03/07/13- 03/29/13   right breast 4250 cGy 17 sessions  . Hyperlipidemia   . Hypokalemia   . Hyponatremia   . Hypothyroidism   . IBS (irritable bowel syndrome)   . Melanoma ([place])   . Metatarsal bone fracture 2014  . Multiple drug allergies   . Nontoxic thyroid nodule   . Obesity   . Osteoarthritis   . Osteoporosis   . Palpitations   . Personal history of radiation therapy 2014  . Seasonal allergies   .  Skin cancer   . Syncope   . Tremor, essential 08/18/2016  . Vitamin B12 deficiency   . Vitamin D deficiency     Family History  Problem Relation Age of Onset  . Heart disease Mother   . Osteoporosis Mother   . Diabetes Father   . Pancreatic cancer Father   . Colon cancer Other   . Bone cancer Sister   . Prostate cancer Brother   . Colon cancer Brother   . Rectal cancer Sister   . Thyroid disease Sister        benign goiter resected    Social History:   reports that she has never smoked. She has never used smokeless tobacco. She reports that she does not drink alcohol and does not use drugs.  Medications  Current Facility-Administered Medications:  .  ALPRAZolam Duanne Moron) tablet 0.25 mg,  0.25 mg, Oral, BID PRN, Rise Patience, MD, 0.25 mg at 08/18/20 1208 .  cholecalciferol (VITAMIN D3) tablet 5,000 Units, 5,000 Units, Oral, Daily, Rise Patience, MD, 5,000 Units at 08/18/20 [phone] .  dextrose (GLUTOSE) 40 % oral gel 37.5 g, 1 Tube, Oral, Once, Rise Patience, MD .  diclofenac Sodium (VOLTAREN) 1 % topical gel 2 g, 2 g, Topical, QID PRN, Rise Patience, MD .  enoxaparin (LOVENOX) injection 40 mg, 40 mg, Subcutaneous, Q24H, Rise Patience, MD, 40 mg at 08/18/20 8250 .  feeding supplement (GLUCERNA SHAKE) (GLUCERNA SHAKE) liquid 237 mL, 237 mL, Oral, Q24H, Rise Patience, MD, 237 mL at 08/18/20 0556 .  fentaNYL (SUBLIMAZE) injection 25 mcg, 25 mcg, Intravenous, Q2H PRN, Mariel Aloe, MD, 25 mcg at 08/17/20 1816 .  insulin glargine (LANTUS) injection 10 Units, 10 Units, Subcutaneous, QHS, Rise Patience, MD, 10 Units at 08/17/20 2201 .  levothyroxine (SYNTHROID) tablet 75 mcg, 75 mcg, Oral, QAC breakfast, Rise Patience, MD, 75 mcg at 08/18/20 0556 .  lidocaine (LIDODERM) 5 % 1 patch, 1 patch, Transdermal, Q24H, Rise Patience, MD, 1 patch at 08/18/20 0556 .  oxyCODONE-acetaminophen (PERCOCET/ROXICET) 5-325 MG per tablet 1-2 tablet, 1-2 tablet, Oral, Q4H PRN, Mariel Aloe, MD .  pantoprazole (PROTONIX) EC tablet 40 mg, 40 mg, Oral, Q0600, Rise Patience, MD, 40 mg at 08/18/20 0556 .  phenytoin (DILANTIN) ER capsule 100 mg, 100 mg, Oral, BID, Franky Macho, RPH, 100 mg at 08/18/20 5397 .  polyvinyl alcohol (LIQUIFILM TEARS) 1.4 % ophthalmic solution 2 drop, 2 drop, Both Eyes, Q12H PRN, Rise Patience, MD .  primidone (MYSOLINE) tablet 100 mg, 100 mg, Oral, BID, Rise Patience, MD, 100 mg at 08/18/20 6734 .  traMADol (ULTRAM) tablet 50 mg, 50 mg, Oral, Q12H PRN, Mariel Aloe, MD, 50 mg at 08/18/20 1141 .  trandolapril (MAVIK) tablet 2 mg, 2 mg, Oral, Daily, Rise Patience, MD, 2 mg at 08/18/20 1937 .   verapamil (CALAN-SR) CR tablet 240 mg, 240 mg, Oral, Daily, Rise Patience, MD, 240 mg at 08/18/20 9024  Exam: Current vital signs: BP 117/66 (BP Location: Left Arm)   Pulse 83   Temp 98.9 F (37.2 C) (Oral)   Resp 16   Ht 5\' 7"  (1.702 m)   Wt 61.5 kg   SpO2 97%   BMI 21.24 kg/m  Vital signs in last 24 hours: Temp:  [98.8 F (37.1 C)-99.6 F (37.6 C)] 98.9 F (37.2 C) (04/17 1305) Pulse Rate:  [79-90] 83 (04/17 1305) Resp:  [16-20] 16 (04/17 1305) BP: (  110-148)/(62-71) 117/66 (04/17 1305) SpO2:  [95 %-97 %] 97 % (04/17 1305)  GENERAL: Awake, alert in NAD HEENT: - Normocephalic and atraumatic, moist mucous membranes with post pharyngeal redness, tonsillar hypertrophy, or drainage. No lymphadenopathy.  LUNGS - Normal respiratory effort.  CV - RRR ABDOMEN - Soft, nontender Ext: warm, well perfused. Skin: very warm. No rash.  Psych: Affect light.   NEURO:  Mental Status: AA&Ox3  Speech/Language: speech is without dysarthria or aphasia. Naming, repetition, fluency, and comprehension intact. Cranial Nerves:  II: PERRL 65mm/brisk. visual fields are decreased on the right due to vision loss in OS (chronic).  III, IV, VI: EOMI. No nystagmus. Lid elevation symmetric and full.  V: sensation is intact and symmetrical to face. Moves jaw back and forth.  VII: Smile is symmetrical. Able to puff cheeks and raise eyebrows.  VIII: hearing intact to voice IX, X: palate elevation is symmetric. Phonation normal.  XI: normal sternocleidomastoid and trapezius muscle strength ELF:YBOFBP is symmetrical without fasciculations.   Motor: 4+/5 strength is all muscle groups.  Tone is normal. Bulk is normal.  Sensation- Intact to light touch bilaterally in all four extremities. Extinction absent to light touch to DSS.  Coordination: FTN intact bilaterally. No pronator drift. No essential or intentional tremor noted.  Gait: deferred  Labs I have reviewed labs in epic and the results  pertinent to this consultation are: Phenytoin level 21.9 08/17/20 down to 13.6 08/18/20.   CBC    Component Value Date/Time   WBC 7.1 08/17/2020 0502   RBC 3.68 (L) 08/17/2020 0502   HGB 12.0 08/17/2020 0502   HGB 11.7 08/31/2017 1053   HGB 12.7 01/04/2013 1212   HCT 35.8 (L) 08/17/2020 0502   HCT 36.2 08/31/2017 1053   HCT 37.7 01/04/2013 1212   PLT 251 08/17/2020 0502   PLT 243 08/31/2017 1053   MCV 97.3 08/17/2020 0502   MCV 98 (H) 08/31/2017 1053   MCV 89.2 01/04/2013 1212   MCH 32.6 08/17/2020 0502   MCHC 33.5 08/17/2020 0502   RDW 13.2 08/17/2020 0502   RDW 14.3 08/31/2017 1053   RDW 13.9 01/04/2013 1212   LYMPHSABS 1.0 08/17/2020 0005   LYMPHSABS 1.6 08/31/2017 1053   LYMPHSABS 1.7 01/04/2013 1212   MONOABS 0.4 08/17/2020 0005   MONOABS 0.7 01/04/2013 1212   EOSABS 0.1 08/17/2020 0005   EOSABS 0.1 08/31/2017 1053   BASOSABS 0.0 08/17/2020 0005   BASOSABS 0.0 08/31/2017 1053   BASOSABS 0.0 01/04/2013 1212    CMP     Component Value Date/Time   NA 133 (L) 08/17/2020 0502   NA 138 08/31/2017 1053   NA 138 01/04/2013 1212   K 4.8 08/17/2020 0502   K 3.9 01/04/2013 1212   CL 103 08/17/2020 0502   CO2 22 08/17/2020 0502   CO2 22 01/04/2013 1212   GLUCOSE 86 08/17/2020 0502   GLUCOSE 285 (H) 01/04/2013 1212   BUN 11 08/17/2020 0502   BUN 11 08/31/2017 1053   BUN 10.3 01/04/2013 1212   CREATININE 0.72 08/17/2020 0502   CREATININE 0.78 12/01/2013 1659   CREATININE 1.0 01/04/2013 1212   CALCIUM 9.0 08/17/2020 0502   CALCIUM 9.6 01/04/2013 1212   PROT 6.3 (L) 08/17/2020 0005   PROT 5.8 (L) 08/31/2017 1053   PROT 6.4 01/04/2013 1212   ALBUMIN 3.9 08/17/2020 0005   ALBUMIN 4.1 08/31/2017 1053   ALBUMIN 3.6 01/04/2013 1212   AST 26 08/17/2020 0005   AST 14 01/04/2013 1212   ALT  23 08/17/2020 0005   ALT 16 01/04/2013 1212   ALKPHOS 95 08/17/2020 0005   ALKPHOS 52 01/04/2013 1212   BILITOT 0.8 08/17/2020 0005   BILITOT 0.2 08/31/2017 1053   BILITOT 1.01  01/04/2013 1212   GFRNONAA >60 08/17/2020 0502   GFRAA >60 01/02/2020 0455    Imaging  CT head 1. No evidence of acute intracranial abnormality. No significant scalp swelling or calvarial fracture. 2. Postsurgical changes from prior right frontal craniotomy and subjacent encephalomalacia related to patient's history of prior tumoral resection. 3. Chronic microvascular angiopathy and parenchymal volume loss  Spinal images as per chart.   Assessment: 87 yo female with a significant PMHx including SDH, seizure disorder, and brain mass with craniotomy. Presented yesterday to hospital after a fall sustaining a thoracic compression fracture and her Phenytoin level was high. She has not had a seizure in a long time. Her only symptoms found of Dilantin toxicity are sore throat and fever. Her Dilantin dose has been modified to 100mg  po bid by medicine. Because even small dose changes can drastly affect level, we recommend 130mg  in am and continue 200mg  at night. CYP450 medications should be avoided, however, Tramadol is one of those drugs and she has been on it for awhile with out seizures, so it should be OK to continue. Her newly prescribed Primidone will likely not interfere with her levels of Dilantin. Unfortunately, Dilantin can cause osteoporosis but she is allergic to most of the other AEDs. Given normal level now, she is no longer toxic.   Impression: 1. Incidental finding of Dilantin toxicity found after admission for compression fracture.  2. Frequent falls.  3.Tremors controlled with Primidone.  4. Dilantin toxicity symptoms of fever and sore throat.  5. UTI?   Plan/Recommendations:  1. Dilantin dose of 130mg  in am and 200mg  at night.  2. Patient should f/up with Dr. Jannifer Franklin in 1-2 weeks after discharge.  3. Supportive measures for low grade fever and sore throat.  4. Multiple falls-Home PT deferred to medical team.  5. If you treat UTI, please do not use Cipro.   Pt seen by Clance Boll, NP/Neuro and later by MD. Note/plan to be edited by MD as needed.  Pager: [phone]

## 2020-08-19 ENCOUNTER — Inpatient Hospital Stay (HOSPITAL_COMMUNITY): Payer: Medicare Other

## 2020-08-19 LAB — URINE CULTURE: Culture: >=100000 — AB

## 2020-08-19 LAB — GLUCOSE, CAPILLARY
Glucose-Capillary: 95 mg/dL (ref 70–99)
Glucose-Capillary: 166 mg/dL — ABNORMAL HIGH (ref 70–99)

## 2020-08-19 MED ORDER — METHOCARBAMOL 500 MG PO TABS
500.0000 mg | ORAL_TABLET | Freq: Three times a day (TID) | ORAL | Status: DC | PRN
  Administered 2020-08-19: 500 mg via ORAL
  Filled 2020-08-19: qty 1

## 2020-08-19 MED ORDER — BENZONATATE 100 MG PO CAPS
200.0000 mg | ORAL_CAPSULE | Freq: Two times a day (BID) | ORAL | Status: DC | PRN
  Administered 2020-08-19: 200 mg via ORAL
  Filled 2020-08-19 (×2): qty 2

## 2020-08-19 NOTE — Progress Notes (Signed)
PROGRESS NOTE    [name]  IPJ:[id number] DOB: 02-Jan-1930 DOA: 08/16/2020 PCP: Bartholome Bill, MD   Brief Narrative: [name] is a 87 y.o. female with a history of seizures, diabetes, hypothyroidism, hypertension, depression. Patient presented secondary to a fall and resultant back pain, found to have dilantin toxicity and evidence of acute compression fracture. Pain control regimen initiated.   Assessment & Plan:   Principal Problem:   Dilantin toxicity Active Problems:   Essential hypertension   Cancer of central portion of female breast ([place])   Seizure disorder ([place])   Acquired hypothyroidism   History of breast cancer   Dilantin toxicity Patient dispenses medication on her own for her daily regimens. Phenytoin level of 21.9 on admission. No new changes prescribed dose. Last phenytoin level of 6.4 (low) on 03/31/20. Patient states that she has not taken more phenytoin than prescribed. Repeat phenytoin level of 13.6 on 4/17. Neurology consulted and have reduced to Dilantin 130 mg in the morning and 200 mg at night with recommendations for repeat Dilantin level in 4-5 days  Possible UTI Urine significant for nitrites and bacteria. No specific symptoms. Fosfomycin given on admission. Urine culture pending.  Back pain MRI C-spine and T-spine obtained. Significant for multiple chronic compression fractures but also a new T9 compression fracture. Neurosurgery consulted and recommended pain management. -Continue Percocet prn; recommend Percocet over Tramadol since latter is not helping  Thyroid nodule -Outpatient follow-up  Diabetes mellitus, type 2 -Continue Lantus and SSI  Seizure disorder Patient is on phenytoin as mentioned above which is being held secondary to above. No recent seizures. Patient is on Tramadol as an outpatient.  Primary hypertension -Continue trandolapril and verapamil  Tremors -Continue primidone  Hyponatremia Mild. -Continue IV  fluids  Hypothyroidism -Continue synthroid  Cough Nonproductive. No obvious evidence of infection. No chest pain or dyspnea. -Chest x-ray -Tessalon Perles prn  DVT prophylaxis: Lovenox Code Status:   Code Status: Full Code Family Communication: None at bedside Disposition Plan: Discharge home (PT recommending) vs SNF pending improved control of back pain and OT recommendations   Consultants:   Neurosurgery  Neurology  Procedures:   None  Antimicrobials:  Fosfomycin    Subjective: Cough. Feels horrible today with neck and back pain.  Objective: Vitals:   08/18/20 1305 08/18/20 1815 08/18/20 2316 08/19/20 0441  BP: 117/66 120/67 118/62 125/64  Pulse: 83 82 81 79  Resp: 16 18 16 18   Temp: 98.9 F (37.2 C) 98.8 F (37.1 C) 99 F (37.2 C) 98.5 F (36.9 C)  TempSrc: Oral Oral Oral Oral  SpO2: 97% 100% 97% 97%  Weight:      Height:        Intake/Output Summary (Last 24 hours) at 08/19/2020 1033 Last data filed at 08/19/2020 0442 Gross per 24 hour  Intake --  Output 2300 ml  Net -2300 ml   Filed Weights   08/16/20 2200 08/17/20 0515  Weight: 62.1 kg 61.5 kg    Examination:  General exam: Appears calm and comfortable Respiratory system: Clear but diminished to auscultation. Respiratory effort normal. Cardiovascular system: S1 & S2 heard, RRR. No murmurs, rubs, gallops or clicks. Gastrointestinal system: Abdomen is nondistended, soft and nontender. No organomegaly or masses felt. Normal bowel sounds heard. Central nervous system: Alert and oriented. No focal neurological deficits. Musculoskeletal: No edema. No calf tenderness Skin: No cyanosis. No rashes Psychiatry: Judgement and insight appear normal. Mood & affect appropriate.    Data Reviewed: I have  personally reviewed following labs and imaging studies  CBC Lab Results  Component Value Date   WBC 7.1 08/17/2020   RBC 3.68 (L) 08/17/2020   HGB 12.0 08/17/2020   HCT 35.8 (L) 08/17/2020   MCV  97.3 08/17/2020   MCH 32.6 08/17/2020   PLT 251 08/17/2020   MCHC 33.5 08/17/2020   RDW 13.2 08/17/2020   LYMPHSABS 1.0 08/17/2020   MONOABS 0.4 08/17/2020   EOSABS 0.1 08/17/2020   BASOSABS 0.0 16/02/9603     Last metabolic panel Lab Results  Component Value Date   NA 133 (L) 08/17/2020   K 4.8 08/17/2020   CL 103 08/17/2020   CO2 22 08/17/2020   BUN 11 08/17/2020   CREATININE 0.72 08/17/2020   GLUCOSE 86 08/17/2020   GFRNONAA >60 08/17/2020   GFRAA >60 01/02/2020   CALCIUM 9.0 08/17/2020   PHOS 3.7 05/03/2007   PROT 6.3 (L) 08/17/2020   ALBUMIN 3.9 08/17/2020   LABGLOB 1.7 08/31/2017   AGRATIO 2.4 (H) 08/31/2017   BILITOT 0.8 08/17/2020   ALKPHOS 95 08/17/2020   AST 26 08/17/2020   ALT 23 08/17/2020   ANIONGAP 8 08/17/2020    CBG (last 3)  Recent Labs    08/17/20 2110 08/18/20 0610 08/19/20 0345  GLUCAP 202* 124* 95     GFR: Estimated Creatinine Clearance: 47.2 mL/min (by C-G formula based on SCr of 0.72 mg/dL).  Coagulation Profile: No results for input(s): INR, PROTIME in the last 168 hours.  Recent Results (from the past 240 hour(s))  Culture, Urine     Status: Abnormal   Collection Time: 08/17/20  4:47 AM   Specimen: Urine, Random  Result Value Ref Range Status   Specimen Description URINE, RANDOM  Final   Special Requests   Final    NONE Performed at [facility], [address] [place], [place] [address]    Culture >=100,000 COLONIES/mL KLEBSIELLA PNEUMONIAE (A)  Final   Report Status 08/19/2020 FINAL  Final   Organism ID, Bacteria KLEBSIELLA PNEUMONIAE (A)  Final      Susceptibility   Klebsiella pneumoniae - MIC*    AMPICILLIN RESISTANT Resistant     CEFAZOLIN <=4 SENSITIVE Sensitive     CEFEPIME <=0.12 SENSITIVE Sensitive     CEFTRIAXONE <=0.25 SENSITIVE Sensitive     CIPROFLOXACIN <=0.25 SENSITIVE Sensitive     GENTAMICIN <=1 SENSITIVE Sensitive     IMIPENEM <=0.25 SENSITIVE Sensitive     NITROFURANTOIN 32 SENSITIVE Sensitive      TRIMETH/SULFA <=20 SENSITIVE Sensitive     AMPICILLIN/SULBACTAM 4 SENSITIVE Sensitive     PIP/TAZO <=4 SENSITIVE Sensitive     * >=100,000 COLONIES/mL KLEBSIELLA PNEUMONIAE  SARS CORONAVIRUS 2 (TAT 6-24 HRS) Nasopharyngeal Nasopharyngeal Swab     Status: None   Collection Time: 08/17/20  4:57 AM   Specimen: Nasopharyngeal Swab  Result Value Ref Range Status   SARS Coronavirus 2 NEGATIVE NEGATIVE Final    Comment: (NOTE) SARS-CoV-2 target nucleic acids are NOT DETECTED.  The SARS-CoV-2 RNA is generally detectable in upper and lower respiratory specimens during the acute phase of infection. Negative results do not preclude SARS-CoV-2 infection, do not rule out co-infections with other pathogens, and should not be used as the sole basis for treatment or other patient management decisions. Negative results must be combined with clinical observations, patient history, and epidemiological information. The expected result is Negative.  Fact Sheet for Patients: SugarRoll.be  Fact Sheet for Healthcare Providers: https://www.woods-mathews.com/  This test is  not yet approved or cleared by the [place] and  has been authorized for detection and/or diagnosis of SARS-CoV-2 by FDA under an Emergency Use Authorization (EUA). This EUA will remain  in effect (meaning this test can be used) for the duration of the COVID-19 declaration under Se ction 564(b)(1) of the Act, 21 U.S.C. section 360bbb-3(b)(1), unless the authorization is terminated or revoked sooner.  Performed at [facility], [place] [address] [place], [place] [address]         Radiology Studies: MR CERVICAL SPINE WO CONTRAST  Result Date: 08/17/2020 CLINICAL DATA:  Myelopathy, acute or progressive. Additional history provided: Fall at home. Back pain. EXAM: MRI CERVICAL SPINE WITHOUT CONTRAST TECHNIQUE: Multiplanar, multisequence MR imaging of the cervical spine was  performed. No intravenous contrast was administered. COMPARISON:  Prior CT examinations of the cervical spine 08/17/2020 and earlier. FINDINGS: Intermittently motion degraded examination. Most notably, there is moderate motion degradation of the axial T2 GRE sequence. Alignment: Trace multilevel spondylolisthesis. Vertebrae: Cervical vertebral body height is maintained. Mild chronic T1, T2 and T3 superior endplate compression deformities. Trace degenerative edema along the T1 superior endplate anteriorly. Elsewhere, no significant marrow edema or focal suspicious osseous lesion is identified. Cord: No spinal cord signal abnormality is identified. Posterior Fossa, vertebral arteries, paraspinal tissues: No abnormality identified within included portions of the posterior fossa. Flow voids preserved within the imaged cervical vertebral arteries. Paraspinal soft tissues within normal limits. Mild STIR hyperintense signal within the C3-C4 interspinous space, which Camus be degenerative or Rathmann reflect interspinous ligamentous injury. Disc levels: Multilevel disc degeneration. Most notably, there is moderate disc degeneration at C4-C5, C5-C6 and C6-C7. C2-C3: Shallow broad-based central disc protrusion. Mild facet arthrosis and ligamentum flavum hypertrophy. No significant spinal canal stenosis or neural foraminal narrowing. C3-C4: Shallow broad-based central disc protrusion. Uncovertebral, facet and ligamentum flavum hypertrophy. Mild relative spinal canal narrowing. No significant foraminal stenosis. C4-C5: Posterior disc osteophyte complex. Uncovertebral, facet and ligamentum flavum hypertrophy. Moderate spinal canal stenosis with contact upon the dorsal and ventral spinal cord. Bilateral neural foraminal narrowing (severe right, moderate left). C5-C6: Shallow disc bulge. Facet arthrosis. Mild effacement of the ventral thecal sac with mild relative spinal canal narrowing. No spinal cord mass effect. Moderate bilateral  neural foraminal narrowing. C6-C7: Posterior disc osteophyte complex. Uncovertebral, facet and ligamentum flavum hypertrophy. Mild relative spinal canal narrowing without spinal cord mass effect. Bilateral neural foraminal narrowing (mild right, moderate/severe left). C7-T1: Minimal disc bulge. Mild facet arthrosis. No significant spinal canal or foraminal stenosis. IMPRESSION: Motion degraded examination. Subtle STIR hyperintense signal within the C3-C4 interspinous space, which Kawasaki be degenerative in etiology or [place] reflect interspinous ligament injury. Cervical spondylosis as outlined with multilevel spinal canal and neural foraminal narrowing. Spinal canal stenosis is greatest at C4-C5 (moderate in severity) with contact upon the dorsal and ventral spinal cord. Multilevel neural foraminal narrowing greatest bilaterally at C4-C5 (severe right, moderate left), bilaterally at C5-C6 (moderate) and on the left at C6-C7 (moderate/severe). Mild chronic superior endplate compression deformities at T1, T2 and T3. Electronically Signed   By: Kellie Simmering DO   On: 08/17/2020 11:55   MR THORACIC SPINE WO CONTRAST  Result Date: 08/17/2020 CLINICAL DATA:  Osteoarthritis, thoracic spine. Recent falls. Back pain. EXAM: MRI THORACIC SPINE WITHOUT CONTRAST TECHNIQUE: Multiplanar, multisequence MR imaging of the thoracic spine was performed. No intravenous contrast was administered. COMPARISON:  Prior cervical spine CT examinations 04/21/2020 and earlier. Prior thoracic spine MRI 11/29/2015. FINDINGS: Alignment: Exaggerated thoracic  kyphosis. Trace T3-T4, T4-T5 grade 1 anterolisthesis. Bony retropulsion at the level of the T12 superior endplate. Vertebrae: Redemonstrated severe T12 vertebral compression fracture status post vertebral augmentation. New mild T9 vertebral compression fracture with mild edema at this site, acute/subacute. Moderate chronic T8 vertebral compression fracture with slight interval height loss as  compared to the prior MRI of 11/29/2015. Redemonstrated mild T7 superior endplate compression deformity. Redemonstrated mild chronic T4 superior endplate compression deformity. Mild T1, T2 and T3 superior endplate compression deformities are new as compared to the prior MRI, but chronic. Mild degenerative edema along the T1 superior endplate anteriorly. T6 and T10 vertebral body hemangiomas. Cord:  No spinal cord signal abnormality is identified. Paraspinal and other soft tissues: No paraspinal mass or collection. Disc levels: At T11-T12, there is moderate disc degeneration. Mild bony retropulsion at the level of the T12 superior endplate. Mild effacement of ventral thecal sac and mild relative spinal canal narrowing without spinal cord mass effect. No significant foraminal stenosis. At T12-L1, mild bony retropulsion at the level of the T12 inferior endplate. This partially effaces the ventral thecal sac and mildly narrows the spinal canal without spinal cord mass effect. Mild right neural foraminal narrowing. Mild degenerative changes elsewhere within the thoracic spine with shallow multilevel disc bulges, but no focal disc herniation, spinal canal stenosis or neural foraminal narrowing. Multilevel nerve root sheath cyst. IMPRESSION: Mild acute/early subacute T9 vertebral compression fracture with mild vertebral body edema. Chronic vertebral compression fractures at T12, T8, T7, T4, T3, T2 and T1, as described. The T1, T2 and T3 compression fractures have developed since the prior MRI of 11/29/2015. Additionally, there has been mild progressive height loss at the T8 compression fracture. As before, bony retropulsion at the T12 level mildly narrows the spinal canal. Unchanged mild degenerative disease of the thoracic spine without significant degenerative canal stenosis or foraminal narrowing. Electronically Signed   By: Kellie Simmering DO   On: 08/17/2020 12:42   DG CHEST PORT 1 VIEW  Result Date:  08/19/2020 CLINICAL DATA:  Cough EXAM: PORTABLE CHEST 1 VIEW COMPARISON:  08/16/2020 FINDINGS: Poor inspiration. Heart size is normal. Atherosclerosis and tortuosity of the aorta. Slightly crowded lung markings at the bases could be due to the poor inspiration or indicate mild basilar atelectasis. The upper lungs remain clear. No heart failure or effusion. IMPRESSION: 1. Poor inspiration versus mild basilar atelectasis. Otherwise no active disease. 2. Aortic atherosclerosis and tortuosity. Electronically Signed   By: Nelson Chimes M.D.   On: 08/19/2020 09:20        Scheduled Meds: . cholecalciferol  5,000 Units Oral Daily  . dextrose  1 Tube Oral Once  . enoxaparin (LOVENOX) injection  40 mg Subcutaneous Q24H  . feeding supplement (GLUCERNA SHAKE)  237 mL Oral Q24H  . insulin glargine  10 Units Subcutaneous QHS  . levothyroxine  75 mcg Oral QAC breakfast  . lidocaine  1 patch Transdermal Q24H  . pantoprazole  40 mg Oral Q0600  . phenytoin  100 mg Oral Daily  . phenytoin  200 mg Oral QHS  . phenytoin  30 mg Oral Daily  . primidone  100 mg Oral BID  . trandolapril  2 mg Oral Daily  . verapamil  240 mg Oral Daily   Continuous Infusions:    LOS: 2 days     Cordelia Poche, MD [facility] 08/19/2020, 10:33 AM  If 7PM-7AM, please contact night-coverage [email or web address]

## 2020-08-19 NOTE — Progress Notes (Addendum)
Patient voiced concerns of being at home alone at night after discharge from the hospital and appeared anxious when she mentioned not having any helps after 5PM when her caregiver leaves. I reassured patient that I will share this concern with the interdisciplinary team. Patient also mentioned that her sister wants the provider to check out her chest to r/o PNA. Lungs sound noted clear/dimished w/ exception of dry cont cough, which patient verbalized a chronic issue where she takes a clear yellow round capsule. Patient uses cholaseptic (sp?) spray for her cough at this time. RN will shares and report her concerns to rounding team in AM.   POC: Frequent rounds  Will cont to provide emotional support/reassurance and  encouragement

## 2020-08-19 NOTE — Progress Notes (Addendum)
Pt noted to be hard to arouse; will yell with tactile/painful stimuli and yell "stop!" but will not open her eyes or answer orientation questions. CBG=166, VS 137/79, T=97.5 ax, HR=82, SpO2=93% on RA. On call provider paged, awaiting response. PO meds held at this time 2/2 increased lethargy   Addendum: orders received from on call provider; pt to CT at this time.

## 2020-08-19 NOTE — Progress Notes (Signed)
Physical Therapy Treatment Patient Details Name: [name] MRN: [id number] DOB: 1929/11/04 Today's Date: 08/19/2020    History of Present Illness Patient is a 86 y/o female who presents on 08/17/20 with back pain s/p fall at home. Found to have acute compression fx T9, Dilantin toxicity and possibly UTI. PMH includes IDDM2, HTN, left tib/fib, breast ca, brain tumor s/p surgery complicated with seizure, colon ca, Rt THA, chronic hyponatremia.    PT Comments    Pt received in bed, willing to participate in PT. Reviewed back precautions, mod cueing needed throughout session to maintain precautions. Pt initially moving okay, but fatigued quickly needing mod A to stand from the toilet and for steadying during ambulation. Knee began to buckle and pt reporting increased pain towards end of walking. Pt heavily reliant on RW during standing and unable to balance long enough to pull up undergarments following toilet use. PT expressed concerns regarding pt returning home without 24/7 assistance. Pt agreeing that she would benefit from 24/7 assistance, especially since her last fall happened after standing up from toilet, but declining SNF or ALF. Will need to maximize home health aide hours. PT to continue to follow acutely.    Follow Up Recommendations  Home health PT;Other (comment);Supervision for mobility/OOB (Pt declining SNF or ALF. Will need to increase aid hours.)     Equipment Recommendations  Wheelchair (measurements PT);Wheelchair cushion (measurements PT)    Recommendations for Other Services       Precautions / Restrictions Precautions Precautions: Fall Precaution Comments: lots of falls at home Restrictions Weight Bearing Restrictions: No    Mobility  Bed Mobility Overal bed mobility: Needs Assistance Bed Mobility: Rolling;Sidelying to Sit Rolling: Supervision Sidelying to sit: Supervision       General bed mobility comments: increased time and use of rail for support. Cues  for log roll to protect spine.    Transfers Overall transfer level: Needs assistance Equipment used: Rolling walker (2 wheeled) Transfers: Sit to/from Stand Sit to Stand: Min guard;Mod assist         General transfer comment: min guard for EOB on attempt 1, but pt unable to perform sit to stand from toilet, needed mod A from toilet  Ambulation/Gait Ambulation/Gait assistance: Min assist;Mod assist Gait Distance (Feet): 7 Feet (17 total = 7+10) Assistive device: Rolling walker (2 wheeled) Gait Pattern/deviations: Trunk flexed;Step-through pattern;Decreased step length - right;Decreased step length - left Gait velocity: decreased   General Gait Details: Slow, mildly unsteady gait with RW for support; limited due to back pain when upright.   Stairs             Wheelchair Mobility    Modified Rankin (Stroke Patients Only)       Balance Overall balance assessment: Needs assistance;History of Falls Sitting-balance support: Feet supported;Single extremity supported Sitting balance-Leahy Scale: Fair Sitting balance - Comments: supervision for safety   Standing balance support: During functional activity Standing balance-Leahy Scale: Poor Standing balance comment: Reliant UE support in standing.                            Cognition Arousal/Alertness: Awake/alert Behavior During Therapy: WFL for tasks assessed/performed Overall Cognitive Status: No family/caregiver present to determine baseline cognitive functioning                                 General Comments: Able to follow simple commands. Stating  that she knows she needs help at home and it is unsafe to go to the bathroom by herself, but also unwilling to go to SNF or ALF.      Exercises      General Comments        Pertinent Vitals/Pain Pain Assessment: Faces Faces Pain Scale: Hurts even more Pain Location: back Pain Descriptors / Indicators: Sore;Discomfort;Aching Pain  Intervention(s): Monitored during session;Repositioned    Home Living                      Prior Function            PT Goals (current goals can now be found in the care plan section) Acute Rehab PT Goals Patient Stated Goal: to stop falling and improve pain PT Goal Formulation: With patient Time For Goal Achievement: 09/01/20 Potential to Achieve Goals: Good    Frequency    Min 3X/week      PT Plan Current plan remains appropriate    Co-evaluation              AM-PAC PT "6 Clicks" Mobility   Outcome Measure  Help needed turning from your back to your side while in a flat bed without using bedrails?: A Little Help needed moving from lying on your back to sitting on the side of a flat bed without using bedrails?: A Little Help needed moving to and from a bed to a chair (including a wheelchair)?: A Little Help needed standing up from a chair using your arms (e.g., wheelchair or bedside chair)?: A Lot Help needed to walk in hospital room?: A Little Help needed climbing 3-5 steps with a railing? : A Lot 6 Click Score: 16    End of Session Equipment Utilized During Treatment: Gait belt Activity Tolerance: Patient limited by pain Patient left: in chair;with call bell/phone within reach;with chair alarm set Nurse Communication: Mobility status PT Visit Diagnosis: Pain Pain - part of body:  (back)     Time:  -     Charges:                        Rosita Kea, SPT

## 2020-08-19 NOTE — Progress Notes (Signed)
Overnight floor coverage progress note  Notified by RN that there is an acute change in the patient's mental status.  It is reported that she was awake, alert, and talking at the beginning of the shift.  She is somnolent now but arousable and able to tell her name and that the year is 2022 but not answering any other questions.  She is moving all extremities on command.  Vital signs stable.  CBG 166.  Last dose of Xanax was at 3:10 AM today, Robaxin 9:26 AM today, and Percocet at 1:42 PM today. -Stat ABG and head CT ordered -Hold Xanax, Robaxin, and Percocet

## 2020-08-20 ENCOUNTER — Other Ambulatory Visit: Payer: Self-pay

## 2020-08-20 MED ORDER — OXYCODONE-ACETAMINOPHEN 5-325 MG PO TABS
1.0000 | ORAL_TABLET | Freq: Four times a day (QID) | ORAL | Status: DC | PRN
  Administered 2020-08-20 – 2020-08-21 (×2): 1 via ORAL
  Filled 2020-08-20 (×2): qty 1

## 2020-08-20 NOTE — Progress Notes (Signed)
MEDICATION RELATED CONSULT NOTE - FOLLOW UP   Pharmacy Consult for Phenytoin Indication: h/o seizures  Allergies  Allergen Reactions  . Bystolic [Nebivolol Hcl] Other (See Comments)    "extreme weakness, heaviness in legs & arms, swelling in legs/arms/face, swollen abdomen, pain in bladder, feet pain, soreness in chest"  . Carbamazepine Other (See Comments)    Blood poisoning  Other reaction(s): Unknown  . Cholestyramine Other (See Comments)    "itching rash on stomach, bloated, nausea, vomiting, sleeplessness, extreme pain in arms"  . Hydrazine Yellow [Tartrazine] Other (See Comments)    "does not reduce high blood pressure, pain in arm, high pressure, felt like I was on verge of heart attack, really weak"  . Morphine Other (See Comments)    Feels morbid, weak, still in pain  . Niacin Palpitations    Fast heart beat Other reaction(s): Unknown  . Niaspan [Niacin Er] Palpitations and Other (See Comments)    "fast heart beat, high blood pressure"  . Norvasc [Amlodipine Besylate] Other (See Comments)    "extreme fluid retention/pain)  . Optivar [Azelastine Hcl] Photosensitivity  . Repaglinide Hives  . Sular [Nisoldipine Er] Other (See Comments)    "severe headaches, swelling eyes, hands, feet, shortness of breath, weak, flushed face, brain boiling, fluid retention, high blood sugar, nervous, heart fast beating"  . Telmisartan Other (See Comments)    "headache, difficulty urinating, high blood sugar, fluid retention"  . Amoxicillin-Pot Clavulanate Rash and Other (See Comments)  . Cefdinir Swelling    Vaginal irritation, breathing,   . Ciprofloxacin Hcl Hives  . Clonidine Other (See Comments)    Dry mouth, fluid retention  . Clonidine Hydrochloride Other (See Comments)    Dry mouth, fluid retention  . Codeine Nausea And Vomiting  . Ezetimibe Other (See Comments)    Made weak Other reaction(s): Unknown  . Hydroxychloroquine Other (See Comments)    Low platlets  . Naproxen  Other (See Comments)    Shrinks bladder  . Sulfa Antibiotics Rash  . Ziac [Bisoprolol-Hydrochlorothiazide] Other (See Comments)    "stopped urination"  . Amlodipine Besy-Benazepril Hcl     Other reaction(s): cough  . Azelastine Other (See Comments)    Unknown reaction - Per MAR  . Elavil [Amitriptyline] Other (See Comments)    Gave Pt nightmares  . Empagliflozin Other (See Comments)    "Caused yeast infection, slowed my urine"  . Hydralazine Other (See Comments)    Unknown reaction - Per MAR  . Iodine I 131 Tositumomab Other (See Comments)    Unknown reaction - Per MAR  . Kenalog [Triamcinolone] Diarrhea    Per MAR  . Keppra [Levetiracetam] Other (See Comments)    Shaking  . Lamotrigine Itching and Other (See Comments)  . Norvasc [Amlodipine] Other (See Comments)    Unknown reaction - Per MAR  . Other Other (See Comments)    Other reaction(s): Unknown Other reaction(s): Unknown Other reaction(s): Unknown Other reaction(s): Unknown Other reaction(s): Unknown  . Pregabalin Swelling    Other reaction(s): weight gain  . Pseudoephedrine   . Pseudoephedrine-Guaifenesin Er Other (See Comments)    Unknown reaction - Per MAR  . Risedronate     Unknown reaction - Per MAR  . Ru-Hist D [Brompheniramine-Phenylephrine] Other (See Comments)    Unknown reaction - Per MAR  . Sumatriptan Other (See Comments)  . Topiramate Other (See Comments)    Dry eyes Other reaction(s): Unknown  . Ace Inhibitors Other (See Comments)    unknown  . Actonel [  Risedronate Sodium] Other (See Comments)    unknown  . Amlodipine Besylate Other (See Comments)    unknown  . Aspirin Other (See Comments)    unknown  . Atacand [Candesartan] Other (See Comments)    Unknown reaction - MAR  . Bextra [Valdecoxib] Other (See Comments)    Unknown reaction - Per MAR  . Bisoprolol-Hydrochlorothiazide Other (See Comments)    Unknown reaction - Per MAR  . Candesartan Cilexetil Other (See Comments)    unknown  .  Cefadroxil Other (See Comments)    unknown  . Celecoxib Rash  . Hydrocodone Other (See Comments)    unknown  . Hydrocodone-Acetaminophen Other (See Comments)    unknown  . Iodinated Diagnostic Agents Rash    "All over"  Other reaction(s): Unknown  . Meloxicam Other (See Comments)    unknown  . Methylprednisolone Sodium Succinate Other (See Comments)    unknown  . Nabumetone Other (See Comments)    Unknown reaction  . Penicillins Other (See Comments)    unknown  . Pseudoephedrine-Guaifenesin Other (See Comments)    unknown  . Risedronate Sodium Other (See Comments)    unknown  . Rofecoxib Other (See Comments)    Unknown reaction - MAR Other reaction(s): Unknown  . Ru-Tuss [Chlorphen-Pse-Atrop-Hyos-Scop] Other (See Comments)    unknown  . Sulfonamide Derivatives Other (See Comments)    unknown  . Sulphur [Elemental Sulfur] Other (See Comments)    unknown  . Telithromycin Other (See Comments)    unknown  . Terfenadine Other (See Comments)    Unknown reaction - Per MAR  . Trandolapril-Verapamil Hcl Er Other (See Comments)    Headache, difficulty urinating, high blood sugar, fluid retention  Pt is taking Tarka (trandolapril-verapamil) currently, but requests the medication stay in her allergy list  . Trandolapril-Verapamil Hcl Er Other (See Comments)    Headache, difficulty urinating, high blood sugar, fluid retention  Pt is taking Tarka (trandolapril-verapamil) currently, but requests the medication stay in her allergy list  . Valium [Diazepam] Other (See Comments)    Makes her mean and hyper    Patient Measurements: Height: 5\' 7"  (170.2 cm) Weight: 61.5 kg (135 lb 9.3 oz) IBW/kg (Calculated) : 61.6  Vital Signs: Temp: 98.8 F (37.1 C) (04/19 0612) Temp Source: Oral (04/19 0612) BP: 101/63 (04/19 0612) Pulse Rate: 66 (04/19 0612) Intake/Output from previous day: 04/18 0701 - 04/19 0700 In: -  Out: 1100 [Urine:1100] Intake/Output from this shift: No  intake/output data recorded.  Labs: No results for input(s): WBC, HGB, HCT, PLT, APTT, CREATININE, LABCREA, CREATININE, CREAT24HRUR, MG, PHOS, ALBUMIN, PROT, ALBUMIN, AST, ALT, ALKPHOS, BILITOT, BILIDIR, IBILI in the last 72 hours. Estimated Creatinine Clearance: 47.2 mL/min (by C-G formula based on SCr of 0.72 mg/dL).   Microbiology: Recent Results (from the past 720 hour(s))  Culture, Urine     Status: Abnormal   Collection Time: 08/17/20  4:47 AM   Specimen: Urine, Random  Result Value Ref Range Status   Specimen Description URINE, RANDOM  Final   Special Requests   Final    NONE Performed at [facility], [address] [place], [place] [address]    Culture >=100,000 COLONIES/mL KLEBSIELLA PNEUMONIAE (A)  Final   Report Status 08/19/2020 FINAL  Final   Organism ID, Bacteria KLEBSIELLA PNEUMONIAE (A)  Final      Susceptibility   Klebsiella pneumoniae - MIC*    AMPICILLIN RESISTANT Resistant     CEFAZOLIN <=4 SENSITIVE Sensitive  CEFEPIME <=0.12 SENSITIVE Sensitive     CEFTRIAXONE <=0.25 SENSITIVE Sensitive     CIPROFLOXACIN <=0.25 SENSITIVE Sensitive     GENTAMICIN <=1 SENSITIVE Sensitive     IMIPENEM <=0.25 SENSITIVE Sensitive     NITROFURANTOIN 32 SENSITIVE Sensitive     TRIMETH/SULFA <=20 SENSITIVE Sensitive     AMPICILLIN/SULBACTAM 4 SENSITIVE Sensitive     PIP/TAZO <=4 SENSITIVE Sensitive     * >=100,000 COLONIES/mL KLEBSIELLA PNEUMONIAE  SARS CORONAVIRUS 2 (TAT 6-24 HRS) Nasopharyngeal Nasopharyngeal Swab     Status: None   Collection Time: 08/17/20  4:57 AM   Specimen: Nasopharyngeal Swab  Result Value Ref Range Status   SARS Coronavirus 2 NEGATIVE NEGATIVE Final    Comment: (NOTE) SARS-CoV-2 target nucleic acids are NOT DETECTED.  The SARS-CoV-2 RNA is generally detectable in upper and lower respiratory specimens during the acute phase of infection. Negative results do not preclude SARS-CoV-2 infection, do not rule out co-infections with other  pathogens, and should not be used as the sole basis for treatment or other patient management decisions. Negative results must be combined with clinical observations, patient history, and epidemiological information. The expected result is Negative.  Fact Sheet for Patients: SugarRoll.be  Fact Sheet for Healthcare Providers: https://www.woods-mathews.com/  This test is not yet approved or cleared by the [place] FDA and  has been authorized for detection and/or diagnosis of SARS-CoV-2 by FDA under an Emergency Use Authorization (EUA). This EUA will remain  in effect (meaning this test can be used) for the duration of the COVID-19 declaration under Se ction 564(b)(1) of the Act, 21 U.S.C. section 360bbb-3(b)(1), unless the authorization is terminated or revoked sooner.  Performed at [facility], [place] [address] [place], [place] [address]     Medications:  See electronic med rec   Assessment: 87 y.o. F presents s/p fall. Pt on phenytoin 150mg  in the morning and 200mg  in the evening PTA for h/o seizures. Also on primidone 100mg  BID. Phenytoin level 21.9 (supratherapeutic), albumin 3.9 on admission. Question whether falls could be related to phenytoin toxicity. No active seizures  Phenytoin level down to 13.9 mcg/ml (in therapeutic range).  Dose has been slightly reduced to 130mg  qAM and 200mg  qHS. We will repeat a level in a few days since one dose was missed 4/18 due to lethargy.  Goal of Therapy:  Phenytoin level 10-20 mcg/ml; no seizures  Plan:  Cont Dilantin 200mg  PO qHS Cont Dilantin 130mg  PO qAM Will f/u phenytoin levels in a few daus  Onnie Boer, PharmD, [place], AAHIVP, CPP Infectious Disease Pharmacist 08/20/2020 8:24 AM

## 2020-08-20 NOTE — Progress Notes (Signed)
PROGRESS NOTE    [name]  HGD:[id number] DOB: 11-25-1929 DOA: 08/16/2020 PCP: Bartholome Bill, MD   Brief Narrative: [name] is a 87 y.o. female with a history of seizures, diabetes, hypothyroidism, hypertension, depression. Patient presented secondary to a fall and resultant back pain, found to have dilantin toxicity and evidence of acute compression fracture. Pain control regimen initiated.   Assessment & Plan:   Principal Problem:   Dilantin toxicity Active Problems:   Essential hypertension   Cancer of central portion of female breast ([place])   Seizure disorder ([place])   Acquired hypothyroidism   History of breast cancer   Dilantin toxicity Patient dispenses medication on her own for her daily regimens. Phenytoin level of 21.9 on admission. No new changes prescribed dose. Last phenytoin level of 6.4 (low) on 03/31/20. Patient states that she has not taken more phenytoin than prescribed. Repeat phenytoin level of 13.6 on 4/17. Neurology consulted and have reduced to Dilantin 130 mg in the morning and 200 mg at night with recommendations for repeat Dilantin level in 4-5 days  Possible UTI Urine significant for nitrites and bacteria. No specific symptoms. Fosfomycin given on admission. Urine culture pending.  Back pain MRI C-spine and T-spine obtained. Significant for multiple chronic compression fractures but also a new T9 compression fracture. Neurosurgery consulted and recommended pain management. -Continue Percocet prn; recommend Percocet over Tramadol since latter is not helping  Thyroid nodule -Outpatient follow-up  Diabetes mellitus, type 2 -Continue Lantus and SSI  Seizure disorder Patient is on phenytoin as mentioned above which is being held secondary to above. No recent seizures. Patient is on Tramadol as an outpatient.  Primary hypertension -Continue trandolapril and verapamil  Tremors -Continue primidone  Hyponatremia Mild. -Continue IV  fluids  Hypothyroidism -Continue synthroid  Cough Nonproductive. No obvious evidence of infection. No chest pain or dyspnea. -Chest x-ray -Tessalon Perles prn  DVT prophylaxis: Lovenox Code Status:   Code Status: Full Code Family Communication: None at bedside Disposition Plan: Discharge home likely in 24 hours if remains lucid and pain is better controlled.   Consultants:   Neurosurgery  Neurology  Procedures:   None  Antimicrobials:  Fosfomycin    Subjective: In significant back pain. Crying. States she did not get her pain medication yesterday because of decreased mental awareness but states she just sleeps heavily.  Objective: Vitals:   08/19/20 1803 08/19/20 2256 08/20/20 0612 08/20/20 0945  BP: 117/61 137/79 101/63 103/61  Pulse: 68 82 66 74  Resp: 16 20 18    Temp: 98 F (36.7 C) (!) 97.5 F (36.4 C) 98.8 F (37.1 C)   TempSrc: Oral Axillary Oral   SpO2: 100% 93% 96%   Weight:      Height:        Intake/Output Summary (Last 24 hours) at 08/20/2020 1141 Last data filed at 08/20/2020 0700 Gross per 24 hour  Intake --  Output 1100 ml  Net -1100 ml   Filed Weights   08/16/20 2200 08/17/20 0515  Weight: 62.1 kg 61.5 kg    Examination:  General exam: Appears calm and comfortable Respiratory system: Clear to auscultation. Respiratory effort normal. Cardiovascular system: S1 & S2 heard, RRR. No murmurs, rubs, gallops or clicks. Gastrointestinal system: Abdomen is nondistended, soft and nontender. No organomegaly or masses felt. Normal bowel sounds heard. Central nervous system: Alert and oriented. No focal neurological deficits. Musculoskeletal: No edema. No calf tenderness Skin: No cyanosis. No rashes Psychiatry: Judgement and insight appear normal.  Teary.   Data Reviewed: I have personally reviewed following labs and imaging studies  CBC Lab Results  Component Value Date   WBC 7.1 08/17/2020   RBC 3.68 (L) 08/17/2020   HGB 12.0 08/17/2020    HCT 35.8 (L) 08/17/2020   MCV 97.3 08/17/2020   MCH 32.6 08/17/2020   PLT 251 08/17/2020   MCHC 33.5 08/17/2020   RDW 13.2 08/17/2020   LYMPHSABS 1.0 08/17/2020   MONOABS 0.4 08/17/2020   EOSABS 0.1 08/17/2020   BASOSABS 0.0 16/02/9603     Last metabolic panel Lab Results  Component Value Date   NA 133 (L) 08/17/2020   K 4.8 08/17/2020   CL 103 08/17/2020   CO2 22 08/17/2020   BUN 11 08/17/2020   CREATININE 0.72 08/17/2020   GLUCOSE 86 08/17/2020   GFRNONAA >60 08/17/2020   GFRAA >60 01/02/2020   CALCIUM 9.0 08/17/2020   PHOS 3.7 05/03/2007   PROT 6.3 (L) 08/17/2020   ALBUMIN 3.9 08/17/2020   LABGLOB 1.7 08/31/2017   AGRATIO 2.4 (H) 08/31/2017   BILITOT 0.8 08/17/2020   ALKPHOS 95 08/17/2020   AST 26 08/17/2020   ALT 23 08/17/2020   ANIONGAP 8 08/17/2020    CBG (last 3)  Recent Labs    08/18/20 0610 08/19/20 0345 08/19/20 2244  GLUCAP 124* 95 166*     GFR: Estimated Creatinine Clearance: 47.2 mL/min (by C-G formula based on SCr of 0.72 mg/dL).  Coagulation Profile: No results for input(s): INR, PROTIME in the last 168 hours.  Recent Results (from the past 240 hour(s))  Culture, Urine     Status: Abnormal   Collection Time: 08/17/20  4:47 AM   Specimen: Urine, Random  Result Value Ref Range Status   Specimen Description URINE, RANDOM  Final   Special Requests   Final    NONE Performed at [facility], [address], [place], [place] [address]    Culture >=100,000 COLONIES/mL KLEBSIELLA PNEUMONIAE (A)  Final   Report Status 08/19/2020 FINAL  Final   Organism ID, Bacteria KLEBSIELLA PNEUMONIAE (A)  Final      Susceptibility   Klebsiella pneumoniae - MIC*    AMPICILLIN RESISTANT Resistant     CEFAZOLIN <=4 SENSITIVE Sensitive     CEFEPIME <=0.12 SENSITIVE Sensitive     CEFTRIAXONE <=0.25 SENSITIVE Sensitive     CIPROFLOXACIN <=0.25 SENSITIVE Sensitive     GENTAMICIN <=1 SENSITIVE Sensitive     IMIPENEM <=0.25 SENSITIVE Sensitive      NITROFURANTOIN 32 SENSITIVE Sensitive     TRIMETH/SULFA <=20 SENSITIVE Sensitive     AMPICILLIN/SULBACTAM 4 SENSITIVE Sensitive     PIP/TAZO <=4 SENSITIVE Sensitive     * >=100,000 COLONIES/mL KLEBSIELLA PNEUMONIAE  SARS CORONAVIRUS 2 (TAT 6-24 HRS) Nasopharyngeal Nasopharyngeal Swab     Status: None   Collection Time: 08/17/20  4:57 AM   Specimen: Nasopharyngeal Swab  Result Value Ref Range Status   SARS Coronavirus 2 NEGATIVE NEGATIVE Final    Comment: (NOTE) SARS-CoV-2 target nucleic acids are NOT DETECTED.  The SARS-CoV-2 RNA is generally detectable in upper and lower respiratory specimens during the acute phase of infection. Negative results do not preclude SARS-CoV-2 infection, do not rule out co-infections with other pathogens, and should not be used as the sole basis for treatment or other patient management decisions. Negative results must be combined with clinical observations, patient history, and epidemiological information. The expected result is Negative.  Fact Sheet for Patients: SugarRoll.be  Fact Sheet for  Healthcare Providers: https://www.woods-mathews.com/  This test is not yet approved or cleared by the [place] and  has been authorized for detection and/or diagnosis of SARS-CoV-2 by FDA under an Emergency Use Authorization (EUA). This EUA will remain  in effect (meaning this test can be used) for the duration of the COVID-19 declaration under Se ction 564(b)(1) of the Act, 21 U.S.C. section 360bbb-3(b)(1), unless the authorization is terminated or revoked sooner.  Performed at [facility], [place] [address], [place], [place] [address]         Radiology Studies: CT HEAD WO CONTRAST  Result Date: 08/19/2020 CLINICAL DATA:  [place] EXAM: CT HEAD WITHOUT CONTRAST TECHNIQUE: Contiguous axial images were obtained from the base of the skull through the vertex without intravenous contrast. COMPARISON:   08/17/2020 FINDINGS: Brain: Unchanged right frontal encephalomalacia. No intracranial hemorrhage or other acute abnormality. Old left basal ganglia small vessel infarct. There is periventricular hypoattenuation compatible with chronic microvascular disease. Vascular: No abnormal hyperdensity of the major intracranial arteries or dural venous sinuses. No intracranial atherosclerosis. Skull: Remote right frontal craniotomy. Sinuses/Orbits: No fluid levels or advanced mucosal thickening of the visualized paranasal sinuses. No mastoid or middle ear effusion. The orbits are normal. IMPRESSION: 1. No acute intracranial abnormality. 2. Unchanged right frontal encephalomalacia and old left basal ganglia small vessel infarct. Electronically Signed   By: Ulyses Jarred M.D.   On: 08/19/2020 23:49   DG CHEST PORT 1 VIEW  Result Date: 08/19/2020 CLINICAL DATA:  Cough EXAM: PORTABLE CHEST 1 VIEW COMPARISON:  08/16/2020 FINDINGS: Poor inspiration. Heart size is normal. Atherosclerosis and tortuosity of the aorta. Slightly crowded lung markings at the bases could be due to the poor inspiration or indicate mild basilar atelectasis. The upper lungs remain clear. No heart failure or effusion. IMPRESSION: 1. Poor inspiration versus mild basilar atelectasis. Otherwise no active disease. 2. Aortic atherosclerosis and tortuosity. Electronically Signed   By: Nelson Chimes M.D.   On: 08/19/2020 09:20        Scheduled Meds: . cholecalciferol  5,000 Units Oral Daily  . dextrose  1 Tube Oral Once  . enoxaparin (LOVENOX) injection  40 mg Subcutaneous Q24H  . feeding supplement (GLUCERNA SHAKE)  237 mL Oral Q24H  . insulin glargine  10 Units Subcutaneous QHS  . levothyroxine  75 mcg Oral QAC breakfast  . lidocaine  1 patch Transdermal Q24H  . pantoprazole  40 mg Oral Q0600  . phenytoin  100 mg Oral Daily  . phenytoin  200 mg Oral QHS  . phenytoin  30 mg Oral Daily  . primidone  100 mg Oral BID  . trandolapril  2 mg Oral  Daily  . verapamil  240 mg Oral Daily   Continuous Infusions:    LOS: 3 days     Cordelia Poche, MD [facility] 08/20/2020, 11:41 AM  If 7PM-7AM, please contact night-coverage [email or web address]

## 2020-08-20 NOTE — Progress Notes (Signed)
Physical Therapy Treatment Patient Details Name: [name] MRN: [id number] DOB: 1929/07/07 Today's Date: 08/20/2020    History of Present Illness Patient is a 87 y/o female who presents on 08/17/20 with back pain s/p fall at home. Found to have acute compression fx T9, Dilantin toxicity and possibly UTI. PMH includes IDDM2, HTN, left tib/fib, breast ca, brain tumor s/p surgery complicated with seizure, colon ca, Rt THA, chronic hyponatremia.    PT Comments    Pt received in bed, reporting increased LBP. Moving at generally min A to min guard level. Fairly steady during ambulation with RW today but pt declining attempt to progress gait distance. Agreeable to exercises in recliner to address LE strengthening. Pt more consistently following simple commands, but noted change in memory. Reviewed back precautions with pt, who was unable to state precautions. Pt not remembering most of prior PT session about 24 hours ago, stating "I haven't walk this far since I've been here." When PT pointed out that she walked to/from the bathroom yesterday, she stated "Maybe I remember going to the bathroom, but I don't remember walking to the recliner." Pt repeating same stories to PT and unsure of which day ("maybe Tuesday or Wednesday"). When memory deficits pointed out to pt, pt stating "I'm like this at home too." PT again recommending SNF or ALF for safety but pt declining. Left in recliner with all needs met, chair alarm active, and call bell within reach. RN aware of status. Will continue to follow acutely.    Follow Up Recommendations  Home health PT;Other (comment);Supervision/Assistance - 24 hour (Pt declining SNF or ALF. Will need to increase aid hours.)     Equipment Recommendations  Wheelchair (measurements PT);Wheelchair cushion (measurements PT)    Recommendations for Other Services       Precautions / Restrictions Precautions Precautions: Fall;Back Precaution Comments: lots of falls at  home Restrictions Weight Bearing Restrictions: No    Mobility  Bed Mobility Overal bed mobility: Needs Assistance Bed Mobility: Rolling;Sidelying to Sit Rolling: Supervision Sidelying to sit: Supervision       General bed mobility comments: increased time and use of rail for support. Cues for log roll to protect spine. better job keeping spine in neutral position today    Transfers Overall transfer level: Needs assistance Equipment used: Rolling walker (2 wheeled) Transfers: Sit to/from Stand Sit to Stand: Min assist         General transfer comment: min A for power up into standing  Ambulation/Gait Ambulation/Gait assistance: Min guard Gait Distance (Feet): 15 Feet Assistive device: Rolling walker (2 wheeled) Gait Pattern/deviations: Trunk flexed;Step-through pattern;Decreased step length - right;Decreased step length - left Gait velocity: decreased   General Gait Details: Slow, mildly unsteady gait with RW for support   Stairs             Wheelchair Mobility    Modified Rankin (Stroke Patients Only)       Balance Overall balance assessment: Needs assistance;History of Falls Sitting-balance support: Feet supported;Single extremity supported Sitting balance-Leahy Scale: Fair Sitting balance - Comments: supervision for safety   Standing balance support: During functional activity Standing balance-Leahy Scale: Poor Standing balance comment: Reliant UE support in standing                            Cognition Arousal/Alertness: Awake/alert Behavior During Therapy: WFL for tasks assessed/performed Overall Cognitive Status: No family/caregiver present to determine baseline cognitive functioning  General Comments: Followed simple commands consistently with increased time, but did not remember PT worked with her yesterday stating "this is the first I've walked since i've been in the hospital." Unsure  of day and seemed to repeat herself multiple times unknowingly      Exercises General Exercises - Lower Extremity Ankle Circles/Pumps: AROM;Both;20 reps;Seated Long Arc Quad: Strengthening;Both;10 reps;Seated Hip Flexion/Marching: Strengthening;Both;10 reps;Seated    General Comments        Pertinent Vitals/Pain Pain Assessment: Faces Faces Pain Scale: Hurts little more Pain Location: back Pain Descriptors / Indicators: Sore;Discomfort;Aching Pain Intervention(s): Monitored during session;Repositioned    Home Living                      Prior Function            PT Goals (current goals can now be found in the care plan section) Acute Rehab PT Goals Patient Stated Goal: to stop falling and improve pain PT Goal Formulation: With patient Time For Goal Achievement: 09/01/20 Potential to Achieve Goals: Good    Frequency    Min 3X/week      PT Plan Current plan remains appropriate    Co-evaluation              AM-PAC PT "6 Clicks" Mobility   Outcome Measure  Help needed turning from your back to your side while in a flat bed without using bedrails?: A Little Help needed moving from lying on your back to sitting on the side of a flat bed without using bedrails?: A Little Help needed moving to and from a bed to a chair (including a wheelchair)?: A Little Help needed standing up from a chair using your arms (e.g., wheelchair or bedside chair)?: A Little Help needed to walk in hospital room?: A Little Help needed climbing 3-5 steps with a railing? : A Lot 6 Click Score: 17    End of Session Equipment Utilized During Treatment: Gait belt Activity Tolerance: Patient limited by pain Patient left: in chair;with call bell/phone within reach;with chair alarm set Nurse Communication: Mobility status PT Visit Diagnosis: Pain Pain - part of body:  (back)     Time:  -     Charges:                        Rosita Kea, SPT

## 2020-08-20 NOTE — Care Management Important Message (Signed)
Important Message  Patient Details  Name: [name] MRN: [id number] Date of Birth: 1930-02-04   Medicare Important Message Given:  Yes     Orbie Pyo 08/20/2020, 4:36 PM

## 2020-08-20 NOTE — Progress Notes (Signed)
Rt tried 2x to obtain abg not successful pt. Is in pain and refusing.

## 2020-08-21 DIAGNOSIS — R52 Pain, unspecified: Secondary | ICD-10-CM

## 2020-08-21 DIAGNOSIS — E041 Nontoxic single thyroid nodule: Secondary | ICD-10-CM | POA: Insufficient documentation

## 2020-08-21 DIAGNOSIS — S22000A Wedge compression fracture of unspecified thoracic vertebra, initial encounter for closed fracture: Secondary | ICD-10-CM

## 2020-08-21 DIAGNOSIS — E039 Hypothyroidism, unspecified: Secondary | ICD-10-CM

## 2020-08-21 LAB — GLUCOSE, CAPILLARY
Glucose-Capillary: 293 mg/dL — ABNORMAL HIGH (ref 70–99)
Glucose-Capillary: 287 mg/dL — ABNORMAL HIGH (ref 70–99)
Glucose-Capillary: 189 mg/dL — ABNORMAL HIGH (ref 70–99)
Glucose-Capillary: 231 mg/dL — ABNORMAL HIGH (ref 70–99)

## 2020-08-21 MED ORDER — PHENYTOIN SODIUM EXTENDED 200 MG PO CAPS: 200.0000 mg | ORAL_CAPSULE | Freq: Every day | ORAL | Status: DC

## 2020-08-21 MED ORDER — OXYCODONE HCL 5 MG PO TABS: 2.5000 mg | ORAL_TABLET | Freq: Four times a day (QID) | ORAL | 0 refills | Status: DC | PRN

## 2020-08-21 MED ORDER — ACETAMINOPHEN 500 MG PO TABS: 1000.0000 mg | ORAL_TABLET | Freq: Three times a day (TID) | ORAL | 0 refills | Status: DC

## 2020-08-21 MED ORDER — PHENYTOIN SODIUM EXTENDED 30 MG PO CAPS: 30.0000 mg | ORAL_CAPSULE | Freq: Every day | ORAL | Status: DC

## 2020-08-21 MED ORDER — PHENYTOIN SODIUM EXTENDED 100 MG PO CAPS: 100.0000 mg | ORAL_CAPSULE | Freq: Every day | ORAL | Status: DC

## 2020-08-21 MED ORDER — CHOLESTYRAMINE 4 G PO PACK
4.0000 g | PACK | Freq: Two times a day (BID) | ORAL | Status: DC
  Administered 2020-08-21 – 2020-08-22 (×3): 4 g via ORAL
  Filled 2020-08-21 (×4): qty 1

## 2020-08-21 MED ORDER — LANTUS SOLOSTAR 100 UNIT/ML ~~LOC~~ SOPN: 10.0000 [IU] | PEN_INJECTOR | Freq: Every day | SUBCUTANEOUS | Status: AC

## 2020-08-21 MED ORDER — BENZONATATE 200 MG PO CAPS: 200.0000 mg | ORAL_CAPSULE | Freq: Two times a day (BID) | ORAL | 0 refills | Status: DC | PRN

## 2020-08-21 MED ORDER — RISAQUAD PO CAPS
1.0000 | ORAL_CAPSULE | Freq: Every day | ORAL | Status: DC
  Administered 2020-08-21 – 2020-08-22 (×2): 1 via ORAL
  Filled 2020-08-21 (×2): qty 1

## 2020-08-21 MED ORDER — COVID-19 MRNA VAC-TRIS(PFIZER) 30 MCG/0.3ML IM SUSP
0.3000 mL | Freq: Once | INTRAMUSCULAR | Status: AC
  Administered 2020-08-21: 0.3 mL via INTRAMUSCULAR
  Filled 2020-08-21: qty 0.3

## 2020-08-21 MED ORDER — INSULIN ASPART 100 UNIT/ML ~~LOC~~ SOLN
0.0000 [IU] | Freq: Every day | SUBCUTANEOUS | Status: DC
  Administered 2020-08-21: 2 [IU] via SUBCUTANEOUS

## 2020-08-21 MED ORDER — ACETAMINOPHEN 500 MG PO TABS
1000.0000 mg | ORAL_TABLET | Freq: Three times a day (TID) | ORAL | Status: DC
  Administered 2020-08-21 – 2020-08-22 (×4): 1000 mg via ORAL
  Filled 2020-08-21 (×4): qty 2

## 2020-08-21 MED ORDER — RISAQUAD PO CAPS: 1.0000 | ORAL_CAPSULE | Freq: Every day | ORAL | Status: DC

## 2020-08-21 MED ORDER — INSULIN ASPART 100 UNIT/ML ~~LOC~~ SOLN
0.0000 [IU] | Freq: Three times a day (TID) | SUBCUTANEOUS | Status: DC
  Administered 2020-08-21: 2 [IU] via SUBCUTANEOUS
  Administered 2020-08-21: 5 [IU] via SUBCUTANEOUS
  Administered 2020-08-22 (×2): 2 [IU] via SUBCUTANEOUS

## 2020-08-21 NOTE — TOC Initial Note (Addendum)
Transition of Care [facility]) - Initial/Assessment Note    Patient Details  Name: [name] MRN: [id number] Date of Birth: 1929/09/26  Transition of Care [facility]) CM/SW Contact:    Bethann Berkshire, [place] Phone Number: 08/21/2020, 12:29 PM  Clinical Narrative:                  CSW met with pt to discuss SNF. Pt lives at home alone. Reports she has a brother and sister in the area as well as a niece. Pt has a walker and 3 in 1 at home. She reports she is agreeable to SNF at this time and that her niece especially wants her to go to SNF. Pt has been to [facility] as recently as February 2022. CSW informed pt that pt [place] have copays. Pt is fine with this and explained she had to pay money up front to go to [facility] in the past. Pt reports she has had 2 covid vaccines but not a booster. She is open to get a booster shot if needed. CSW completed fl2 and faxed in hub. CSW messaged Nikki with [facility] to inquire if they could take pt; waiting on response.  1313: [facility] can take pt if she can pay 11,400 up front since she is outside of medicare covered days. Pt would also need to get boosted. CSW spoke with pt who is agreeable to this plan. She is calling her broker to make arrangements for money to be available. CSW notified MD of plan and requested covid booster.    Expected Discharge Plan: Skilled Nursing Facility Barriers to Discharge: No SNF bed   Patient Goals and CMS Choice Patient states their goals for this hospitalization and ongoing recovery are:: Pt wants SNF CMS Medicare.gov Compare Post Acute Care list provided to:: Patient Choice offered to / list presented to : Patient  Expected Discharge Plan and Services Expected Discharge Plan: [place]       Living arrangements for the past 2 months: Single Family Home Expected Discharge Date: 08/21/20                                    Prior Living Arrangements/Services Living arrangements for the past 2 months:  Single Family Home Lives with:: Self Patient language and need for interpreter reviewed:: Yes        Need for Family Participation in Patient Care: Yes (Comment) Care giver support system in place?: No (comment) Current home services: DME Criminal Activity/Legal Involvement Pertinent to Current Situation/Hospitalization: No - Comment as needed  Activities of Daily Living Home Assistive Devices/Equipment: Gilford Rile (specify type) ADL Screening (condition at time of admission) Patient's cognitive ability adequate to safely complete daily activities?: Yes Is the patient deaf or have difficulty hearing?: No Does the patient have difficulty seeing, even when wearing glasses/contacts?: No Does the patient have difficulty concentrating, remembering, or making decisions?: No Patient able to express need for assistance with ADLs?: Yes Does the patient have difficulty dressing or bathing?: Yes Independently performs ADLs?: No Does the patient have difficulty walking or climbing stairs?: Yes Weakness of Legs: Both Weakness of Arms/Hands: Both  Permission Sought/Granted   Permission granted to share information with : Yes, Verbal Permission Granted  Share Information with NAME: Emelda Fear (Niece)   [phone] (Home Phone)           Emotional Assessment Appearance:: Appears stated age Attitude/Demeanor/Rapport: Engaged  Affect (typically observed): Adaptable Orientation: : Oriented to Self,Oriented to Place,Oriented to  Time,Oriented to Situation Alcohol / Substance Use: Not Applicable Psych Involvement: No (comment)  Admission diagnosis:  Pain [R52] Fall [W19.XXXA] Dilantin toxicity [T42.0X1A] Patient Active Problem List   Diagnosis Date Noted  . Compression fracture of body of thoracic vertebra ([place]) 08/21/2020  . Dilantin toxicity 08/17/2020  . Postoperative anemia due to acute blood loss 06/06/2020  . Leukocytosis   . Closed right hip fracture ([place]) 06/04/2020  .  Constipation 05/27/2020  . Dyslipidemia 05/27/2020  . Enterocolitis due to Clostridium difficile, not specified as recurrent 05/27/2020  . Pleural effusion on right 04/01/2020  . Acute respiratory failure with hypoxia ([place]) 04/01/2020  . Pneumonia of right lung due to infectious organism 04/01/2020  . Uncontrolled type 2 diabetes mellitus with hyperglycemia, with long-term current use of insulin ([place]) 04/01/2020  . Abnormal CT of the chest 04/01/2020  . Subacute pulmonary embolism ([place]) 04/01/2020  . Acute metabolic encephalopathy 69/62/9528  . Adenocarcinoma of colon ([place]) 04/01/2020  . Nausea & vomiting 03/12/2020  . Cancer of ascending colon pT3pN0 (0/12 LN) s/p lap right colectomy 03/06/2020 03/09/2020  . History of multiple allergies 03/09/2020  . S/P right hemicolectomy 03/06/2020  . Closed fracture of shaft of tibia 02/12/2020  . Abnormal feces 12/11/2019  . Gait abnormality 12/04/2019  . Impaired left ventricular function 09/05/2019  . Hypokalemia   . Effusion of left knee joint   . Hyponatremia   . Left patella fracture 10/26/2018  . Fracture of patella 10/26/2018  . Pain in left knee 09/29/2018  . Acquired hypothyroidism 08/25/2017  . 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 Hammer toe 08/25/2017  . History of breast cancer 08/25/2017  . Nontoxic thyroid nodule 08/25/2017  . Osteopetrosis 08/25/2017  . Dysuria 01/19/2017  . Vitamin D deficiency 11/18/2016  . Tremor, essential 08/18/2016  . Seizure disorder ([place]) 10/16/2014  . Brain tumor (benign) ([place]) 10/18/2013  . Subdural hemorrhage ([place]) 10/09/2013  . Anxiety 10/09/2013  . Compression fracture of T12 vertebra ([place]) 10/09/2013  . Nontoxic multinodular goiter 09/05/2013  . Syncope 08/27/2013  . Carotid artery disease ([place]) 08/27/2013  . Abnormal thyroid ultrasound 08/27/2013  . Cancer of central portion of female breast ([place]) 12/30/2012  . Osteoporosis 03/06/2010  . ASTHMA 03/05/2009  . Asthma  03/05/2009  . IRRITABLE BOWEL SYNDROME  diarrhea type 03/21/2008  . ANEMIA-NOS 12/16/2006  . GERD 12/16/2006  . Insulin-requiring or dependent type II diabetes mellitus ([place]) 10/29/2006  . Mixed hyperlipidemia 10/29/2006  . Essential hypertension 10/29/2006  . Allergic rhinitis, cause unspecified 10/29/2006  . OSTEOARTHRITIS 10/29/2006   PCP:  Bartholome Bill, MD Pharmacy:   [facility] [address] [place] [address] [place] [address] Phone: [phone] Fax: [phone]  [facility] [place], [place] [address] [place] [address] Phone: [phone] Fax: [phone]  [id number] [place]: [phone]: [phone] [place]: [phone]: [phone]    Social Determinants of Health (SDOH) Interventions    Readmission Risk Interventions Readmission Risk Prevention Plan 06/06/2020 03/15/2020 03/13/2020  Transportation Screening Complete Complete -  PCP or Specialist Appt within 3-5  Days - Complete Complete  HRI or Home Care Consult - - Complete  Social Work Consult for Recovery Care Planning/Counseling - Complete Complete  Palliative Care Screening - Complete Complete  Medication Review Press photographer) - Complete Complete  PCP or Specialist appointment within 3-5 days of discharge Complete - -  SW Recovery Care/Counseling Consult Complete - -  Palliative Care Screening Not Applicable - -  Some recent data might be hidden

## 2020-08-21 NOTE — NC FL2 (Signed)
[place] LEVEL OF CARE SCREENING TOOL     IDENTIFICATION  Patient Name: [name] Show Birthdate: [birth year] Sex: female Admission Date (Current Location): 08/16/2020  [facility] and [place] Number:  Herbalist and Address:  [facility] [place], [place] [address] [place], [place] [address]      Provider Number: [id number]  Attending Physician Name and Address:  Geradine Girt, DO  Relative Name and Phone Number:  Emelda Fear (Niece)   [phone] [facility] Phone)    Current Level of Care: Hospital Recommended Level of Care: [place] Prior Approval Number:    Date Approved/Denied:   PASRR Number: [id number]  Discharge Plan: SNF    Current Diagnoses: Patient Active Problem List   Diagnosis Date Noted  . Compression fracture of body of thoracic vertebra ([place]) 08/21/2020  . Dilantin toxicity 08/17/2020  . Postoperative anemia due to acute blood loss 06/06/2020  . Leukocytosis   . Closed right hip fracture ([place]) 06/04/2020  . Constipation 05/27/2020  . Dyslipidemia 05/27/2020  . Enterocolitis due to Clostridium difficile, not specified as recurrent 05/27/2020  . Pleural effusion on right 04/01/2020  . Acute respiratory failure with hypoxia ([place]) 04/01/2020  . Pneumonia of right lung due to infectious organism 04/01/2020  . Uncontrolled type 2 diabetes mellitus with hyperglycemia, with long-term current use of insulin ([place]) 04/01/2020  . Abnormal CT of the chest 04/01/2020  . Subacute pulmonary embolism ([place]) 04/01/2020  . Acute metabolic encephalopathy 66/29/4765  . Adenocarcinoma of colon ([place]) 04/01/2020  . Nausea & vomiting 03/12/2020  . Cancer of ascending colon pT3pN0 (0/12 LN) s/p lap right colectomy 03/06/2020 03/09/2020  . History of multiple allergies 03/09/2020  . S/P right hemicolectomy 03/06/2020  . Closed fracture of shaft of tibia 02/12/2020  . Abnormal feces 12/11/2019  . Gait abnormality 12/04/2019  .  Impaired left ventricular function 09/05/2019  . Hypokalemia   . Effusion of left knee joint   . Hyponatremia   . Left patella fracture 10/26/2018  . Fracture of patella 10/26/2018  . Pain in left knee 09/29/2018  . Acquired hypothyroidism 08/25/2017  . 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 Hammer toe 08/25/2017  . History of breast cancer 08/25/2017  . Nontoxic thyroid nodule 08/25/2017  . Osteopetrosis 08/25/2017  . Dysuria 01/19/2017  . Vitamin D deficiency 11/18/2016  . Tremor, essential 08/18/2016  . Seizure disorder ([place]) 10/16/2014  . Brain tumor (benign) ([place]) 10/18/2013  . Subdural hemorrhage ([place]) 10/09/2013  . Anxiety 10/09/2013  . Compression fracture of T12 vertebra ([place]) 10/09/2013  . Nontoxic multinodular goiter 09/05/2013  . Syncope 08/27/2013  . Carotid artery disease ([place]) 08/27/2013  . Abnormal thyroid ultrasound 08/27/2013  . Cancer of central portion of female breast ([place]) 12/30/2012  . Osteoporosis 03/06/2010  . ASTHMA 03/05/2009  . Asthma 03/05/2009  . IRRITABLE BOWEL SYNDROME  diarrhea type 03/21/2008  . ANEMIA-NOS 12/16/2006  . GERD 12/16/2006  . Insulin-requiring or dependent type II diabetes mellitus ([place]) 10/29/2006  . Mixed hyperlipidemia 10/29/2006  . Essential hypertension 10/29/2006  . Allergic rhinitis, cause unspecified 10/29/2006  . OSTEOARTHRITIS 10/29/2006    Orientation RESPIRATION BLADDER Height & Weight     Self,Time,Situation,Place  Normal Incontinent,External catheter Weight: 135 lb 9.3 oz (61.5 kg) Height:  5\' 7"  (170.2 cm)  BEHAVIORAL SYMPTOMS/MOOD NEUROLOGICAL BOWEL NUTRITION STATUS      Continent Diet (see d/c summary)  AMBULATORY STATUS COMMUNICATION OF NEEDS Skin   Extensive Assist Verbally Normal  Personal Care Assistance Level of Assistance  Bathing,Feeding,Dressing Bathing Assistance: Limited assistance Feeding assistance: Independent Dressing Assistance: Limited  assistance     Functional Limitations Info  Sight,Hearing,Speech Sight Info: Adequate Hearing Info: Adequate Speech Info: Adequate    SPECIAL CARE FACTORS FREQUENCY  PT (By licensed PT),OT (By licensed OT)     PT Frequency: 5x/week OT Frequency: 5x/week            Contractures Contractures Info: Not present    Additional Factors Info  Code Status,Allergies Code Status Info: full code Allergies Info: See h and p           Current Medications (08/21/2020):  This is the current hospital active medication list Current Facility-Administered Medications  Medication Dose Route Frequency Provider Last Rate Last Admin  . acetaminophen (TYLENOL) tablet 1,000 mg  1,000 mg Oral TID Eulogio Bear U, DO   1,000 mg at 08/21/20 1245  . acidophilus (RISAQUAD) capsule 1 capsule  1 capsule Oral Daily Vann, Jessica U, DO   1 capsule at 08/21/20 1040  . benzonatate (TESSALON) capsule 200 mg  200 mg Oral BID PRN Mariel Aloe, MD   200 mg at 08/19/20 0926  . cholecalciferol (VITAMIN D3) tablet 5,000 Units  5,000 Units Oral Daily Rise Patience, MD   5,000 Units at 08/21/20 [phone]  . cholestyramine (QUESTRAN) packet 4 g  4 g Oral BID Donnelle, Rubey U, DO   4 g at 08/21/20 1040  . dextrose (GLUTOSE) 40 % oral gel 37.5 g  1 Tube Oral Once Rise Patience, MD      . diclofenac Sodium (VOLTAREN) 1 % topical gel 2 g  2 g Topical QID PRN Rise Patience, MD      . enoxaparin (LOVENOX) injection 40 mg  40 mg Subcutaneous Q24H Rise Patience, MD   40 mg at 08/21/20 8338  . feeding supplement (GLUCERNA SHAKE) (GLUCERNA SHAKE) liquid 237 mL  237 mL Oral Q24H Rise Patience, MD   237 mL at 08/19/20 0400  . insulin aspart (novoLOG) injection 0-5 Units  0-5 Units Subcutaneous QHS Vann, Jessica U, DO      . insulin aspart (novoLOG) injection 0-9 Units  0-9 Units Subcutaneous TID WC Vann, Jessica U, DO   5 Units at 08/21/20 1120  . insulin glargine (LANTUS) injection 10 Units  10  Units Subcutaneous QHS Rise Patience, MD   10 Units at 08/20/20 2206  . levothyroxine (SYNTHROID) tablet 75 mcg  75 mcg Oral QAC breakfast Rise Patience, MD   75 mcg at 08/21/20 [phone]  . lidocaine (LIDODERM) 5 % 1 patch  1 patch Transdermal Q24H Mariel Aloe, MD   1 patch at 08/21/20 [phone]  . oxyCODONE-acetaminophen (PERCOCET/ROXICET) 5-325 MG per tablet 1 tablet  1 tablet Oral Q6H PRN Mariel Aloe, MD   1 tablet at 08/21/20 1120  . pantoprazole (PROTONIX) EC tablet 40 mg  40 mg Oral Q0600 Rise Patience, MD   40 mg at 08/21/20 0641  . phenytoin (DILANTIN) ER capsule 100 mg  100 mg Oral Daily Gardiner Barefoot, NP   100 mg at 08/21/20 0829  . phenytoin (DILANTIN) ER capsule 200 mg  200 mg Oral QHS Gardiner Barefoot, NP   200 mg at 08/20/20 2206  . phenytoin (DILANTIN) ER capsule 30 mg  30 mg Oral Daily Kirby-Graham, Karsten Fells, NP   30 mg at 08/21/20 0830  . polyvinyl alcohol (LIQUIFILM TEARS) 1.4 % ophthalmic  solution 2 drop  2 drop Both Eyes Q12H PRN Rise Patience, MD      . primidone (MYSOLINE) tablet 100 mg  100 mg Oral BID Rise Patience, MD   100 mg at 08/21/20 4996  . trandolapril (MAVIK) tablet 2 mg  2 mg Oral Daily Rise Patience, MD   2 mg at 08/21/20 0830  . verapamil (CALAN-SR) CR tablet 240 mg  240 mg Oral Daily Rise Patience, MD   240 mg at 08/21/20 9249     Discharge Medications: Please see discharge summary for a list of discharge medications.  Relevant Imaging Results:  Relevant Lab Results:   Additional Information SSN: [id number]; vaccinated x2. Willing to get booster  Bethann Berkshire, LCSW

## 2020-08-21 NOTE — Discharge Summary (Addendum)
Physician Discharge Summary  [name] FGB:[id number] DOB: 11/06/1929 DOA: 08/16/2020  PCP: Bartholome Bill, MD  Admit date: 08/16/2020 Discharge date: 08/22/2020  Admitted From: home Discharge disposition: snf   Recommendations for Outpatient Follow-Up:    Dilantin level next week: Continue Dilantin 211m PO qHS Continue Dilantin 1346mPO qAM Level next week as outpt Bowel regimen   Discharge Diagnosis:   Principal Problem:   Dilantin toxicity Active Problems:   Essential hypertension   Seizure disorder ([facility]  Acquired hypothyroidism   History of breast cancer   [facility]   Discharge Condition: Improved.  Diet recommendation: Low sodium, heart healthy.  Carbohydrate-modified.  Wound care: None.  Code status: Full.   History of Present Illness:   [name] is a [age].o. female with a history of seizures, diabetes, hypothyroidism, hypertension, depression. Patient presented secondary to a fall and resultant back pain, found to have dilantin toxicity and evidence of acute compression fracture. Pain control regimen initiated.   Hospital Course by Problem:   Dilantin toxicity Patient dispenses medication on her own for her daily regimens. Phenytoin level of 21.9 on admission. No new changes to prescribed dose. Last phenytoin level of 6.4 (low) on 03/31/20. Patient states that she has not taken more phenytoin than prescribed. Repeat phenytoin level of 13.6 on 4/17. Neurology consulted and have reduced to Dilantin 130 mg in the morning and 200 mg at night with recommendations for repeat Dilantin level in 4-5 days  Possible UTI -treated   Back pain MRI C-spine and T-spine obtained. Significant for multiple chronic compression fractures but also a new T9 compression fracture. Neurosurgery consulted and recommended pain management. -scheduled tylenol and prn oxy  Thyroid nodule -per report, no need for  follow up  Diabetes mellitus, type 2 -Continue Lantus and SSI  Seizure disorder Patient is on phenytoin as mentioned above   No recent seizures.   Primary hypertension -Continue trandolapril and verapamil  Tremors -Continue primidone  Hyponatremia Mild.  Hypothyroidism -Continue synthroid  Cough Nonproductive. No obvious evidence of infection. No chest pain or dyspnea. -Chest x-ray -Tessalon Perles prn    Medical Consultants:    NS neurology  Discharge Exam:   Vitals:   08/21/20 0534 08/21/20 0829  BP: 123/60 108/61  Pulse: 81   Resp: 18   Temp: 97.7 F (36.5 C)   SpO2: 99%    Vitals:   08/20/20 1655 08/21/20 0150 08/21/20 0534 08/21/20 0829  BP: (!) 101/56 102/65 123/60 108/61  Pulse: 76 72 81   Resp: '18 18 18   ' Temp: 99.1 F (37.3 C) 98.1 F (36.7 C) 97.7 F (36.5 C)   TempSrc: Oral Oral Oral   SpO2: 100% 98% 99%   Weight:      Height:        General exam: Appears calm and comfortable.    The results of significant diagnostics from this hospitalization (including imaging, microbiology, ancillary and laboratory) are listed below for reference.     Procedures and Diagnostic Studies:   DG Chest 1 View  Result Date: 08/16/2020 CLINICAL DATA:  Found down, fell EXAM: CHEST  1 VIEW COMPARISON:  06/04/2020 FINDINGS: The heart size and mediastinal contours are within normal limits. Both lungs are clear. The visualized skeletal structures are unremarkable. IMPRESSION: No active disease. Electronically Signed   By: MiRanda Ngo.D.   On: 08/16/2020 23:19   CT Head Wo Contrast  Addendum Date: 08/17/2020   ADDENDUM REPORT: 08/17/2020 02:53 ADDENDUM: Additional impression point: 2.1 cm hypoattenuating nodule in the left thyroid lobe. In the setting of significant comorbidities or limited life expectancy, no follow-up recommended (ref: J Am Coll Radiol. 2015 Feb;12(2): 143-50). Electronically Signed   By: Lovena Le M.D.   On: 08/17/2020 02:53    Result Date: 08/17/2020 CLINICAL DATA:  Fall, low back and neck pain EXAM: CT HEAD WITHOUT CONTRAST CT CERVICAL SPINE WITHOUT CONTRAST TECHNIQUE: Multidetector CT imaging of the head and cervical spine was performed following the standard protocol without intravenous contrast. Multiplanar CT image reconstructions of the cervical spine were also generated. COMPARISON:  CT head and cervical spine 04/21/2020, CT a chest 04/01/2020 FINDINGS: CT HEAD FINDINGS Brain: Postsurgical changes from prior right frontal craniotomy and subjacent encephalomalacia related to patient's history of prior tumoral resection. Stable appearance from comparison prior. Additional small hypoattenuating foci in the bilateral basal ganglia likely reflecting sequela of prior lacunar type infarcts. No evidence of acute infarction, hemorrhage, hydrocephalus, extra-axial collection, visible mass lesion or mass effect. Diffuse prominence of the ventricles, cisterns and sulci compatible with parenchymal volume loss. Patchy areas of white matter hypoattenuation are most compatible with chronic microvascular angiopathy. Vascular: Atherosclerotic calcification of the carotid siphons. No hyperdense vessel. Skull: Prior right frontal craniotomy. No significant scalp swelling. No calvarial fracture or other acute or worrisome osseous abnormality. Sinuses/Orbits: Paranasal sinuses and mastoid air cells are predominantly clear. Orbital structures are unremarkable aside from prior lens extractions. Other: None. CT CERVICAL SPINE FINDINGS Alignment: Exaggeration of the normal cervical lordosis, similar to prior exam. Stepwise retrolisthesis C2-C5 with anterolisthesis C5 on C6 and C6 on C7 is unchanged from priors and likely related to underlying degenerative changes. Skull base and vertebrae: The osseous structures appear diffusely demineralized which Nygren limit detection of small or nondisplaced fractures. No visible skull base fracture. Unchanged remote  appearing endplate deformities involving the inferior endplates C4, C6 as well as the superior endplates T2-T3. Additional endplate deformity at T5 is new from comparison CT chest 04/01/2020 but with a more subacute to chronic appearance. No other acute vertebral compression deformities, fracture or acute osseous abnormality is seen. Minimal calcific pannus formation posterior to the dens, nonspecific but can be seen with rheumatoid arthropathy or CPPD arthropathy. Soft tissues and spinal canal: No pre or paravertebral fluid or swelling. No visible canal hematoma. Airways patent. Cervical carotid atherosclerosis. Disc levels: No significant central canal or foraminal stenosis identified within the imaged levels of the spine. Larger disc osteophyte complexes are present C3-4 C5-6 and C6-7 resulting in at most mild canal stenoses. Additional uncinate spurring and facet hypertrophic changes result in mild-to-moderate multilevel neural foraminal narrowing as well. Upper chest: Biapical pleuroparenchymal scarring. Calcifications seen in the aortic arch and proximal great vessels. Some interlobular septal thickening and dependent predominant ground-glass, some of which is likely atelectatic though superimposed edematous features could have a similar appearance. Other: 2.1 cm hypoattenuating nodule in the left thyroid lobe, similar to prior. IMPRESSION: 1. No evidence of acute intracranial abnormality. No significant scalp swelling or calvarial fracture. 2. Postsurgical changes from prior right frontal craniotomy and subjacent encephalomalacia related to patient's history of prior tumoral resection. 3. Chronic microvascular angiopathy and parenchymal volume loss. 4. Remote appearing endplate deformities involving the inferior endplates C4, C6 as well as the superior endplates T2-T3. Additional endplate deformity at T5 is new from comparison CT chest 04/01/2020 but with a more subacute to chronic appearance though could  correlate for  point tenderness. 5. No definitively acute fracture or traumatic listhesis of the cervical spine. 6. Multilevel degenerative changes of the cervical spine as described above. 7. Some interlobular septal thickening and dependent predominant ground-glass, some of which is likely atelectatic though superimposed edematous features could have a similar appearance. 8. Aortic Atherosclerosis (ICD10-I70.0). Electronically Signed: By: Lovena Le M.D. On: 08/17/2020 02:41   CT Cervical Spine Wo Contrast  Addendum Date: 08/17/2020   ADDENDUM REPORT: 08/17/2020 02:53 ADDENDUM: Additional impression point: 2.1 cm hypoattenuating nodule in the left thyroid lobe. In the setting of significant comorbidities or limited life expectancy, no follow-up recommended (ref: J Am Coll Radiol. 2015 Feb;12(2): 143-50). Electronically Signed   By: Lovena Le M.D.   On: 08/17/2020 02:53   Result Date: 08/17/2020 CLINICAL DATA:  Fall, low back and neck pain EXAM: CT HEAD WITHOUT CONTRAST CT CERVICAL SPINE WITHOUT CONTRAST TECHNIQUE: Multidetector CT imaging of the head and cervical spine was performed following the standard protocol without intravenous contrast. Multiplanar CT image reconstructions of the cervical spine were also generated. COMPARISON:  CT head and cervical spine 04/21/2020, CT a chest 04/01/2020 FINDINGS: CT HEAD FINDINGS Brain: Postsurgical changes from prior right frontal craniotomy and subjacent encephalomalacia related to patient's history of prior tumoral resection. Stable appearance from comparison prior. Additional small hypoattenuating foci in the bilateral basal ganglia likely reflecting sequela of prior lacunar type infarcts. No evidence of acute infarction, hemorrhage, hydrocephalus, extra-axial collection, visible mass lesion or mass effect. Diffuse prominence of the ventricles, cisterns and sulci compatible with parenchymal volume loss. Patchy areas of white matter hypoattenuation are most  compatible with chronic microvascular angiopathy. Vascular: Atherosclerotic calcification of the carotid siphons. No hyperdense vessel. Skull: Prior right frontal craniotomy. No significant scalp swelling. No calvarial fracture or other acute or worrisome osseous abnormality. Sinuses/Orbits: Paranasal sinuses and mastoid air cells are predominantly clear. Orbital structures are unremarkable aside from prior lens extractions. Other: None. CT CERVICAL SPINE FINDINGS Alignment: Exaggeration of the normal cervical lordosis, similar to prior exam. Stepwise retrolisthesis C2-C5 with anterolisthesis C5 on C6 and C6 on C7 is unchanged from priors and likely related to underlying degenerative changes. Skull base and vertebrae: The osseous structures appear diffusely demineralized which Faivre limit detection of small or nondisplaced fractures. No visible skull base fracture. Unchanged remote appearing endplate deformities involving the inferior endplates C4, C6 as well as the superior endplates T2-T3. Additional endplate deformity at T5 is new from comparison CT chest 04/01/2020 but with a more subacute to chronic appearance. No other acute vertebral compression deformities, fracture or acute osseous abnormality is seen. Minimal calcific pannus formation posterior to the dens, nonspecific but can be seen with rheumatoid arthropathy or CPPD arthropathy. Soft tissues and spinal canal: No pre or paravertebral fluid or swelling. No visible canal hematoma. Airways patent. Cervical carotid atherosclerosis. Disc levels: No significant central canal or foraminal stenosis identified within the imaged levels of the spine. Larger disc osteophyte complexes are present C3-4 C5-6 and C6-7 resulting in at most mild canal stenoses. Additional uncinate spurring and facet hypertrophic changes result in mild-to-moderate multilevel neural foraminal narrowing as well. Upper chest: Biapical pleuroparenchymal scarring. Calcifications seen in the  aortic arch and proximal great vessels. Some interlobular septal thickening and dependent predominant ground-glass, some of which is likely atelectatic though superimposed edematous features could have a similar appearance. Other: 2.1 cm hypoattenuating nodule in the left thyroid lobe, similar to prior. IMPRESSION: 1. No evidence of acute intracranial abnormality. No significant scalp swelling or calvarial  fracture. 2. Postsurgical changes from prior right frontal craniotomy and subjacent encephalomalacia related to patient's history of prior tumoral resection. 3. Chronic microvascular angiopathy and parenchymal volume loss. 4. Remote appearing endplate deformities involving the inferior endplates C4, C6 as well as the superior endplates T2-T3. Additional endplate deformity at T5 is new from comparison CT chest 04/01/2020 but with a more subacute to chronic appearance though could correlate for point tenderness. 5. No definitively acute fracture or traumatic listhesis of the cervical spine. 6. Multilevel degenerative changes of the cervical spine as described above. 7. Some interlobular septal thickening and dependent predominant ground-glass, some of which is likely atelectatic though superimposed edematous features could have a similar appearance. 8. Aortic Atherosclerosis (ICD10-I70.0). Electronically Signed: By: Lovena Le M.D. On: 08/17/2020 02:41   CT Lumbar Spine Wo Contrast  Result Date: 08/17/2020 CLINICAL DATA:  Fall with low back pain EXAM: CT LUMBAR SPINE WITHOUT CONTRAST TECHNIQUE: Multidetector CT imaging of the lumbar spine was performed without intravenous contrast administration. Multiplanar CT image reconstructions were also generated. COMPARISON:  None. FINDINGS: Segmentation: 5 lumbar type vertebrae. Alignment: Grade 1 L2-3 anterolisthesis. Vertebrae: Chronic compression fracture of T12 with approximately 75% height loss. There is vertebral augmentation cement. Paraspinal and other soft  tissues: Calcific aortic atherosclerosis. Disc levels: L1-2: No disc herniation or stenosis. L2-3: Moderate facet hypertrophy with grade 1 anterolisthesis. Mild spinal canal stenosis. L3-4: Moderate facet hypertrophy and small disc bulge. No spinal canal or neural foraminal stenosis. L4-5: Moderate right neural foraminal stenosis due to combination of right extraforaminal osteophyte and facet hypertrophy. L5-S1: Small disc bulge. Mild left foraminal stenosis. Mild facet hypertrophy. IMPRESSION: 1. No acute abnormality of the lumbar spine. 2. Chronic T12 compression fracture with approximately 75% height loss and vertebral augmentation cement. 3. Moderate right L4-5 neural foraminal stenosis. Aortic Atherosclerosis (ICD10-I70.0). Electronically Signed   By: Ulyses Jarred M.D.   On: 08/17/2020 02:50   MR CERVICAL SPINE WO CONTRAST  Result Date: 08/17/2020 CLINICAL DATA:  Myelopathy, acute or progressive. Additional history provided: Fall at home. Back pain. EXAM: MRI CERVICAL SPINE WITHOUT CONTRAST TECHNIQUE: Multiplanar, multisequence MR imaging of the cervical spine was performed. No intravenous contrast was administered. COMPARISON:  Prior CT examinations of the cervical spine 08/17/2020 and earlier. FINDINGS: Intermittently motion degraded examination. Most notably, there is moderate motion degradation of the axial T2 GRE sequence. Alignment: Trace multilevel spondylolisthesis. Vertebrae: Cervical vertebral body height is maintained. Mild chronic T1, T2 and T3 superior endplate compression deformities. Trace degenerative edema along the T1 superior endplate anteriorly. Elsewhere, no significant marrow edema or focal suspicious osseous lesion is identified. Cord: No spinal cord signal abnormality is identified. Posterior Fossa, vertebral arteries, paraspinal tissues: No abnormality identified within included portions of the posterior fossa. Flow voids preserved within the imaged cervical vertebral arteries.  Paraspinal soft tissues within normal limits. Mild STIR hyperintense signal within the C3-C4 interspinous space, which Astle be degenerative or Frieden reflect interspinous ligamentous injury. Disc levels: Multilevel disc degeneration. Most notably, there is moderate disc degeneration at C4-C5, C5-C6 and C6-C7. C2-C3: Shallow broad-based central disc protrusion. Mild facet arthrosis and ligamentum flavum hypertrophy. No significant spinal canal stenosis or neural foraminal narrowing. C3-C4: Shallow broad-based central disc protrusion. Uncovertebral, facet and ligamentum flavum hypertrophy. Mild relative spinal canal narrowing. No significant foraminal stenosis. C4-C5: Posterior disc osteophyte complex. Uncovertebral, facet and ligamentum flavum hypertrophy. Moderate spinal canal stenosis with contact upon the dorsal and ventral spinal cord. Bilateral neural foraminal narrowing (severe right, moderate left). C5-C6: Shallow  disc bulge. Facet arthrosis. Mild effacement of the ventral thecal sac with mild relative spinal canal narrowing. No spinal cord mass effect. Moderate bilateral neural foraminal narrowing. C6-C7: Posterior disc osteophyte complex. Uncovertebral, facet and ligamentum flavum hypertrophy. Mild relative spinal canal narrowing without spinal cord mass effect. Bilateral neural foraminal narrowing (mild right, moderate/severe left). C7-T1: Minimal disc bulge. Mild facet arthrosis. No significant spinal canal or foraminal stenosis. IMPRESSION: Motion degraded examination. Subtle STIR hyperintense signal within the C3-C4 interspinous space, which Mayse be degenerative in etiology or [place] reflect interspinous ligament injury. Cervical spondylosis as outlined with multilevel spinal canal and neural foraminal narrowing. Spinal canal stenosis is greatest at C4-C5 (moderate in severity) with contact upon the dorsal and ventral spinal cord. Multilevel neural foraminal narrowing greatest bilaterally at C4-C5 (severe right,  moderate left), bilaterally at C5-C6 (moderate) and on the left at C6-C7 (moderate/severe). Mild chronic superior endplate compression deformities at T1, T2 and T3. Electronically Signed   By: Kellie Simmering DO   On: 08/17/2020 11:55   MR THORACIC SPINE WO CONTRAST  Result Date: 08/17/2020 CLINICAL DATA:  Osteoarthritis, thoracic spine. Recent falls. Back pain. EXAM: MRI THORACIC SPINE WITHOUT CONTRAST TECHNIQUE: Multiplanar, multisequence MR imaging of the thoracic spine was performed. No intravenous contrast was administered. COMPARISON:  Prior cervical spine CT examinations 04/21/2020 and earlier. Prior thoracic spine MRI 11/29/2015. FINDINGS: Alignment: Exaggerated thoracic kyphosis. Trace T3-T4, T4-T5 grade 1 anterolisthesis. Bony retropulsion at the level of the T12 superior endplate. Vertebrae: Redemonstrated severe T12 vertebral compression fracture status post vertebral augmentation. New mild T9 vertebral compression fracture with mild edema at this site, acute/subacute. Moderate chronic T8 vertebral compression fracture with slight interval height loss as compared to the prior MRI of 11/29/2015. Redemonstrated mild T7 superior endplate compression deformity. Redemonstrated mild chronic T4 superior endplate compression deformity. Mild T1, T2 and T3 superior endplate compression deformities are new as compared to the prior MRI, but chronic. Mild degenerative edema along the T1 superior endplate anteriorly. T6 and T10 vertebral body hemangiomas. Cord:  No spinal cord signal abnormality is identified. Paraspinal and other soft tissues: No paraspinal mass or collection. Disc levels: At T11-T12, there is moderate disc degeneration. Mild bony retropulsion at the level of the T12 superior endplate. Mild effacement of ventral thecal sac and mild relative spinal canal narrowing without spinal cord mass effect. No significant foraminal stenosis. At T12-L1, mild bony retropulsion at the level of the T12 inferior  endplate. This partially effaces the ventral thecal sac and mildly narrows the spinal canal without spinal cord mass effect. Mild right neural foraminal narrowing. Mild degenerative changes elsewhere within the thoracic spine with shallow multilevel disc bulges, but no focal disc herniation, spinal canal stenosis or neural foraminal narrowing. Multilevel nerve root sheath cyst. IMPRESSION: Mild acute/early subacute T9 vertebral compression fracture with mild vertebral body edema. Chronic vertebral compression fractures at T12, T8, T7, T4, T3, T2 and T1, as described. The T1, T2 and T3 compression fractures have developed since the prior MRI of 11/29/2015. Additionally, there has been mild progressive height loss at the T8 compression fracture. As before, bony retropulsion at the T12 level mildly narrows the spinal canal. Unchanged mild degenerative disease of the thoracic spine without significant degenerative canal stenosis or foraminal narrowing. Electronically Signed   By: Kellie Simmering DO   On: 08/17/2020 12:42   DG Ankle Left Port  Result Date: 08/16/2020 CLINICAL DATA:  Fall, now with pain. EXAM: PORTABLE LEFT ANKLE - 2 VIEW COMPARISON:  None. FINDINGS:  Diffusely decreased bone density. No evidence of fracture, dislocation, or joint effusion. Partially visualized old healed tibial shaft fracture. Cortical irregularity of the first metatarsal is partially visualized and could represent an old healed fracture. Plantar calcaneal spur. No evidence of severe arthropathy. No aggressive appearing focal bone abnormality. Soft tissues are unremarkable. IMPRESSION: No acute displaced fracture or dislocation in a patient with diffusely decreased bone density. Electronically Signed   By: Iven Finn M.D.   On: 08/16/2020 23:56   DG HIP UNILAT WITH PELVIS 2-3 VIEWS LEFT  Result Date: 08/16/2020 CLINICAL DATA:  Found down, fell EXAM: DG HIP (WITH OR WITHOUT PELVIS) 2-3V LEFT COMPARISON:  None. FINDINGS: Frontal  view of the pelvis as well as frontal and cross-table lateral views of the left hip are obtained. Right hip prosthesis is partially visualized and unremarkable. The left hip is unremarkable with no fracture, subluxation, or dislocation. Extensive spondylosis of the lower lumbar spine. Soft tissues are unremarkable. IMPRESSION: 1. No acute displaced fracture. 2. Unremarkable right hip arthroplasty. Electronically Signed   By: Randa Ngo M.D.   On: 08/16/2020 23:22     Labs:   Basic Metabolic Panel: Recent Labs  Lab 08/17/20 0005 08/17/20 0502  NA 132* 133*  K 5.3* 4.8  CL 104 103  CO2 20* 22  GLUCOSE 83 86  BUN 12 11  CREATININE 0.81 0.72  CALCIUM 9.2 9.0   GFR Estimated Creatinine Clearance: 47.2 mL/min (by C-G formula based on SCr of 0.72 mg/dL). Liver Function Tests: Recent Labs  Lab 08/17/20 0005  AST 26  ALT 23  ALKPHOS 95  BILITOT 0.8  PROT 6.3*  ALBUMIN 3.9   No results for input(s): LIPASE, AMYLASE in the last 168 hours. No results for input(s): AMMONIA in the last 168 hours. Coagulation profile No results for input(s): INR, PROTIME in the last 168 hours.  CBC: Recent Labs  Lab 08/17/20 0005 08/17/20 0502  WBC 8.4 7.1  NEUTROABS 6.9  --   HGB 12.5 12.0  HCT 36.9 35.8*  MCV 96.9 97.3  PLT 167 251   Cardiac Enzymes: Recent Labs  Lab 08/17/20 0005  CKTOTAL 55   BNP: Invalid input(s): POCBNP CBG: Recent Labs  Lab 08/18/20 0610 08/19/20 0345 08/19/20 2244 08/20/20 2204 08/21/20 1102  GLUCAP 124* 95 166* 293* 287*   D-Dimer No results for input(s): DDIMER in the last 72 hours. Hgb A1c No results for input(s): HGBA1C in the last 72 hours. Lipid Profile No results for input(s): CHOL, HDL, LDLCALC, TRIG, CHOLHDL, LDLDIRECT in the last 72 hours. Thyroid function studies No results for input(s): TSH, T4TOTAL, T3FREE, THYROIDAB in the last 72 hours.  Invalid input(s): FREET3 Anemia work up No results for input(s): VITAMINB12, FOLATE,  FERRITIN, TIBC, IRON, RETICCTPCT in the last 72 hours. Microbiology Recent Results (from the past 240 hour(s))  Culture, Urine     Status: Abnormal   Collection Time: 08/17/20  4:47 AM   Specimen: Urine, Random  Result Value Ref Range Status   Specimen Description URINE, RANDOM  Final   Special Requests   Final    NONE Performed at [facility], [address] [place], [place] [address]    Culture >=100,000 COLONIES/mL KLEBSIELLA PNEUMONIAE (A)  Final   Report Status 08/19/2020 FINAL  Final   Organism ID, Bacteria KLEBSIELLA PNEUMONIAE (A)  Final      Susceptibility   Klebsiella pneumoniae - MIC*    AMPICILLIN RESISTANT Resistant     CEFAZOLIN <=4 SENSITIVE  Sensitive     CEFEPIME <=0.12 SENSITIVE Sensitive     CEFTRIAXONE <=0.25 SENSITIVE Sensitive     CIPROFLOXACIN <=0.25 SENSITIVE Sensitive     GENTAMICIN <=1 SENSITIVE Sensitive     IMIPENEM <=0.25 SENSITIVE Sensitive     NITROFURANTOIN 32 SENSITIVE Sensitive     TRIMETH/SULFA <=20 SENSITIVE Sensitive     AMPICILLIN/SULBACTAM 4 SENSITIVE Sensitive     PIP/TAZO <=4 SENSITIVE Sensitive     * >=100,000 COLONIES/mL KLEBSIELLA PNEUMONIAE  SARS CORONAVIRUS 2 (TAT 6-24 HRS) Nasopharyngeal Nasopharyngeal Swab     Status: None   Collection Time: 08/17/20  4:57 AM   Specimen: Nasopharyngeal Swab  Result Value Ref Range Status   SARS Coronavirus 2 NEGATIVE NEGATIVE Final    Comment: (NOTE) SARS-CoV-2 target nucleic acids are NOT DETECTED.  The SARS-CoV-2 RNA is generally detectable in upper and lower respiratory specimens during the acute phase of infection. Negative results do not preclude SARS-CoV-2 infection, do not rule out co-infections with other pathogens, and should not be used as the sole basis for treatment or other patient management decisions. Negative results must be combined with clinical observations, patient history, and epidemiological information. The expected result is Negative.  Fact Sheet for  Patients: SugarRoll.be  Fact Sheet for Healthcare Providers: https://www.woods-mathews.com/  This test is not yet approved or cleared by the [place] FDA and  has been authorized for detection and/or diagnosis of SARS-CoV-2 by FDA under an Emergency Use Authorization (EUA). This EUA will remain  in effect (meaning this test can be used) for the duration of the COVID-19 declaration under Se ction 564(b)(1) of the Act, 21 U.S.C. section 360bbb-3(b)(1), unless the authorization is terminated or revoked sooner.  Performed at [facility], [place] [address], [place], [place] [address]      Discharge Instructions:   Discharge Instructions    Diet - low sodium heart healthy   Complete by: As directed    Diet Carb Modified   Complete by: As directed    Increase activity slowly   Complete by: As directed      Allergies as of 08/21/2020      Reactions   Bystolic [nebivolol Hcl] Other (See Comments)   "extreme weakness, heaviness in legs & arms, swelling in legs/arms/face, swollen abdomen, pain in bladder, feet pain, soreness in chest"   Carbamazepine Other (See Comments)   Blood poisoning Other reaction(s): Unknown   Cholestyramine Other (See Comments)   "itching rash on stomach, bloated, nausea, vomiting, sleeplessness, extreme pain in arms"   Hydrazine Yellow [tartrazine] Other (See Comments)   "does not reduce high blood pressure, pain in arm, high pressure, felt like I was on verge of heart attack, really weak"   Morphine Other (See Comments)   Feels morbid, weak, still in pain   Niacin Palpitations   Fast heart beat Other reaction(s): Unknown   Niaspan [niacin Er] Palpitations, Other (See Comments)   "fast heart beat, high blood pressure"   Norvasc [amlodipine Besylate] Other (See Comments)   "extreme fluid retention/pain)   Optivar [azelastine Hcl] Photosensitivity   Repaglinide Hives   Sular [nisoldipine Er] Other (See  Comments)   "severe headaches, swelling eyes, hands, feet, shortness of breath, weak, flushed face, brain boiling, fluid retention, high blood sugar, nervous, heart fast beating"   Telmisartan Other (See Comments)   "headache, difficulty urinating, high blood sugar, fluid retention"   Amoxicillin-pot Clavulanate Rash, Other (See Comments)   Cefdinir Swelling   Vaginal irritation, breathing,  Ciprofloxacin Hcl Hives   Clonidine Other (See Comments)   Dry mouth, fluid retention   Clonidine Hydrochloride Other (See Comments)   Dry mouth, fluid retention   Codeine Nausea And Vomiting   Ezetimibe Other (See Comments)   Made weak Other reaction(s): Unknown   Hydroxychloroquine Other (See Comments)   Low platlets   Naproxen Other (See Comments)   Shrinks bladder   Sulfa Antibiotics Rash   Ziac [bisoprolol-hydrochlorothiazide] Other (See Comments)   "stopped urination"   Amlodipine Besy-benazepril Hcl    Other reaction(s): cough   Azelastine Other (See Comments)   Unknown reaction - Per MAR   Elavil [amitriptyline] Other (See Comments)   Gave Pt nightmares   Empagliflozin Other (See Comments)   "Caused yeast infection, slowed my urine"   Hydralazine Other (See Comments)   Unknown reaction - Per MAR   Iodine I 131 Tositumomab Other (See Comments)   Unknown reaction - Per MAR   Kenalog [triamcinolone] Diarrhea   Per MAR   Keppra [levetiracetam] Other (See Comments)   Shaking   Lamotrigine Itching, Other (See Comments)   Norvasc [amlodipine] Other (See Comments)   Unknown reaction - Per MAR   Other Other (See Comments)   Other reaction(s): Unknown Other reaction(s): Unknown Other reaction(s): Unknown Other reaction(s): Unknown Other reaction(s): Unknown   Pregabalin Swelling   Other reaction(s): weight gain   Pseudoephedrine    Pseudoephedrine-guaifenesin Er Other (See Comments)   Unknown reaction - Per MAR   Risedronate    Unknown reaction - Per MAR   Ru-hist D  [brompheniramine-phenylephrine] Other (See Comments)   Unknown reaction - Per MAR   Sumatriptan Other (See Comments)   Topiramate Other (See Comments)   Dry eyes Other reaction(s): Unknown   Ace Inhibitors Other (See Comments)   unknown   Actonel [risedronate Sodium] Other (See Comments)   unknown   Amlodipine Besylate Other (See Comments)   unknown   Aspirin Other (See Comments)   unknown   Atacand [candesartan] Other (See Comments)   Unknown reaction - MAR   Bextra [valdecoxib] Other (See Comments)   Unknown reaction - Per MAR   Bisoprolol-hydrochlorothiazide Other (See Comments)   Unknown reaction - Per MAR   Candesartan Cilexetil Other (See Comments)   unknown   Cefadroxil Other (See Comments)   unknown   Celecoxib Rash   Hydrocodone Other (See Comments)   unknown   Hydrocodone-acetaminophen Other (See Comments)   unknown   Iodinated Diagnostic Agents Rash   "All over" Other reaction(s): Unknown   Meloxicam Other (See Comments)   unknown   Methylprednisolone Sodium Succinate Other (See Comments)   unknown   Nabumetone Other (See Comments)   Unknown reaction   Penicillins Other (See Comments)   unknown   Pseudoephedrine-guaifenesin Other (See Comments)   unknown   Risedronate Sodium Other (See Comments)   unknown   Rofecoxib Other (See Comments)   Unknown reaction - MAR Other reaction(s): Unknown   Ru-tuss [chlorphen-pse-atrop-hyos-scop] Other (See Comments)   unknown   Sulfonamide Derivatives Other (See Comments)   unknown   Sulphur [elemental Sulfur] Other (See Comments)   unknown   Telithromycin Other (See Comments)   unknown   Terfenadine Other (See Comments)   Unknown reaction - Per MAR   Trandolapril-verapamil Hcl Er Other (See Comments)   Headache, difficulty urinating, high blood sugar, fluid retention Pt is taking Tarka (trandolapril-verapamil) currently, but requests the medication stay in her allergy list   Trandolapril-verapamil Hcl Er Other  (See  Comments)   Headache, difficulty urinating, high blood sugar, fluid retention Pt is taking Tarka (trandolapril-verapamil) currently, but requests the medication stay in her allergy list   Valium [diazepam] Other (See Comments)   Makes her mean and hyper      Medication List    STOP taking these medications   ALPRAZolam 0.25 MG tablet Commonly known as: XANAX   furosemide 20 MG tablet Commonly known as: LASIX   loperamide 2 MG capsule Commonly known as: IMODIUM   phenytoin 50 MG tablet Commonly known as: DILANTIN   senna-docusate 8.6-50 MG tablet Commonly known as: Senokot-S   traMADol 50 MG tablet Commonly known as: ULTRAM     TAKE these medications   acetaminophen 500 MG tablet Commonly known as: TYLENOL Take 2 tablets (1,000 mg total) by mouth 3 (three) times daily. What changed:   when to take this  reasons to take this   acidophilus Caps capsule Take 1 capsule by mouth daily.   benzonatate 200 MG capsule Commonly known as: TESSALON Take 1 capsule (200 mg total) by mouth 2 (two) times daily as needed for cough.   cholestyramine 4 g packet Commonly known as: QUESTRAN Take 4 g by mouth 2 (two) times daily.   diclofenac Sodium 1 % Gel Commonly known as: VOLTAREN Apply 2 g topically 4 (four) times daily as needed (pain). knees   escitalopram 10 MG tablet Commonly known as: LEXAPRO Take 1 tablet (10 mg total) by mouth daily.   feeding supplement (GLUCERNA SHAKE) Liqd Take 237 mLs by mouth daily.   Glucose Meter Test test strip Generic drug: glucose blood See admin instructions.   PRODIGY VOICE BLOOD GLUCOSE VI   insulin aspart 100 UNIT/ML FlexPen Commonly known as: NOVOLOG Inject 2-12 Units into the skin See admin instructions. Injects 2-10 units of insulin under the skin 3 times a day per sliding scale: CBG 0-150: 2 units; CBG 151-200: 4 units; CBG 201-250: 6 units; CBG 251-300: 8 units; CBG 301-350: 10 units; CBG 351-400: 12 units; CBG >400:  12 units and notify the MD/NP   Insulin Pen Needle 32G X 4 MM Misc Used to inject insulin 3x daily   Lantus SoloStar 100 UNIT/ML Solostar Pen Generic drug: insulin glargine Inject 10 Units into the skin at bedtime.   levothyroxine 75 MCG tablet Commonly known as: SYNTHROID Take 1 tablet (75 mcg total) by mouth daily before breakfast.   lidocaine 5 % Commonly known as: LIDODERM Place 1 patch onto the skin daily. Remove & Discard patch within 12 hours or as directed by MD   ondansetron 4 MG disintegrating tablet Commonly known as: ZOFRAN-ODT Take 1 tablet (4 mg total) by mouth every 6 (six) hours as needed for nausea.   oxyCODONE 5 MG immediate release tablet Commonly known as: Roxicodone Take 0.5 tablets (2.5 mg total) by mouth every 6 (six) hours as needed for severe pain.   pantoprazole 40 MG tablet Commonly known as: PROTONIX Take 1 tablet (40 mg total) by mouth daily at 6 (six) AM.   phenytoin 200 MG ER capsule Commonly known as: DILANTIN Take 1 capsule (200 mg total) by mouth at bedtime. What changed: You were already taking a medication with the same name, and this prescription was added. Make sure you understand how and when to take each.   phenytoin 30 MG ER capsule Commonly known as: DILANTIN Take 1 capsule (30 mg total) by mouth daily. Start taking on: August 22, 2020 What changed:   medication strength  how much to take   phenytoin 100 MG ER capsule Commonly known as: DILANTIN Take 1 capsule (100 mg total) by mouth daily. Start taking on: August 22, 2020 What changed: You were already taking a medication with the same name, and this prescription was added. Make sure you understand how and when to take each.   primidone 50 MG tablet Commonly known as: MYSOLINE Take 2 tablets (100 mg total) by mouth in the morning and at bedtime.   Prodigy Voice Blood Glucose w/Device Kit Use to check blood sugar 1 time per day.   Systane 0.4-0.3 % Soln Generic drug:  Polyethyl Glycol-Propyl Glycol Place 2 drops into both eyes every 12 (twelve) hours as needed (dry eyes).   trandolapril-verapamil 2-240 MG tablet Commonly known as: TARKA Take 1 tablet by mouth at bedtime.   Vitamin D-3 125 MCG (5000 UT) Tabs Take 5,000 Units by mouth daily.       Follow-up Information    Bartholome Bill, MD Follow up in 1 week(s).   Specialty: Family Medicine Contact information: [place] [address] [phone]                Time coordinating discharge: 35 min  Signed:  JASZMINE NAVEJAS DO  [facility] 08/21/2020, 12:06 PM

## 2020-08-21 NOTE — Progress Notes (Signed)
Called niece to discuss discharge plan.  Patient does not have 24 hour care-- has 8 hours/day 7 days a week.  Family unable to supplement. Needs safe d/c plan-- will consult [place] DO

## 2020-08-22 DIAGNOSIS — Z20822 Contact with and (suspected) exposure to covid-19: Secondary | ICD-10-CM | POA: Diagnosis not present

## 2020-08-22 DIAGNOSIS — Z794 Long term (current) use of insulin: Secondary | ICD-10-CM | POA: Diagnosis not present

## 2020-08-22 DIAGNOSIS — D649 Anemia, unspecified: Secondary | ICD-10-CM | POA: Diagnosis not present

## 2020-08-22 DIAGNOSIS — R5381 Other malaise: Secondary | ICD-10-CM | POA: Diagnosis not present

## 2020-08-22 DIAGNOSIS — G40909 Epilepsy, unspecified, not intractable, without status epilepticus: Secondary | ICD-10-CM | POA: Diagnosis not present

## 2020-08-22 DIAGNOSIS — M810 Age-related osteoporosis without current pathological fracture: Secondary | ICD-10-CM | POA: Diagnosis not present

## 2020-08-22 DIAGNOSIS — E1142 Type 2 diabetes mellitus with diabetic polyneuropathy: Secondary | ICD-10-CM | POA: Diagnosis not present

## 2020-08-22 DIAGNOSIS — Z9181 History of falling: Secondary | ICD-10-CM | POA: Diagnosis not present

## 2020-08-22 DIAGNOSIS — B351 Tinea unguium: Secondary | ICD-10-CM | POA: Diagnosis not present

## 2020-08-22 DIAGNOSIS — L84 Corns and callosities: Secondary | ICD-10-CM | POA: Diagnosis not present

## 2020-08-22 DIAGNOSIS — C182 Malignant neoplasm of ascending colon: Secondary | ICD-10-CM | POA: Diagnosis not present

## 2020-08-22 DIAGNOSIS — R55 Syncope and collapse: Secondary | ICD-10-CM | POA: Diagnosis not present

## 2020-08-22 DIAGNOSIS — E1159 Type 2 diabetes mellitus with other circulatory complications: Secondary | ICD-10-CM | POA: Diagnosis not present

## 2020-08-22 DIAGNOSIS — I152 Hypertension secondary to endocrine disorders: Secondary | ICD-10-CM | POA: Diagnosis not present

## 2020-08-22 DIAGNOSIS — E039 Hypothyroidism, unspecified: Secondary | ICD-10-CM | POA: Diagnosis not present

## 2020-08-22 DIAGNOSIS — M79674 Pain in right toe(s): Secondary | ICD-10-CM | POA: Diagnosis not present

## 2020-08-22 DIAGNOSIS — Z7401 Bed confinement status: Secondary | ICD-10-CM | POA: Diagnosis not present

## 2020-08-22 DIAGNOSIS — S22009D Unspecified fracture of unspecified thoracic vertebra, subsequent encounter for fracture with routine healing: Secondary | ICD-10-CM | POA: Diagnosis not present

## 2020-08-22 DIAGNOSIS — T420X5D Adverse effect of hydantoin derivatives, subsequent encounter: Secondary | ICD-10-CM | POA: Diagnosis not present

## 2020-08-22 DIAGNOSIS — I69828 Other speech and language deficits following other cerebrovascular disease: Secondary | ICD-10-CM | POA: Diagnosis not present

## 2020-08-22 DIAGNOSIS — E1165 Type 2 diabetes mellitus with hyperglycemia: Secondary | ICD-10-CM | POA: Diagnosis not present

## 2020-08-22 DIAGNOSIS — E871 Hypo-osmolality and hyponatremia: Secondary | ICD-10-CM | POA: Diagnosis not present

## 2020-08-22 DIAGNOSIS — S22070A Wedge compression fracture of T9-T10 vertebra, initial encounter for closed fracture: Secondary | ICD-10-CM | POA: Diagnosis not present

## 2020-08-22 DIAGNOSIS — E041 Nontoxic single thyroid nodule: Secondary | ICD-10-CM | POA: Diagnosis not present

## 2020-08-22 DIAGNOSIS — M2550 Pain in unspecified joint: Secondary | ICD-10-CM | POA: Diagnosis not present

## 2020-08-22 DIAGNOSIS — R2681 Unsteadiness on feet: Secondary | ICD-10-CM | POA: Diagnosis not present

## 2020-08-22 DIAGNOSIS — M6281 Muscle weakness (generalized): Secondary | ICD-10-CM | POA: Diagnosis not present

## 2020-08-22 DIAGNOSIS — R41841 Cognitive communication deficit: Secondary | ICD-10-CM | POA: Diagnosis not present

## 2020-08-22 DIAGNOSIS — G40919 Epilepsy, unspecified, intractable, without status epilepticus: Secondary | ICD-10-CM | POA: Diagnosis not present

## 2020-08-22 DIAGNOSIS — D62 Acute posthemorrhagic anemia: Secondary | ICD-10-CM | POA: Diagnosis not present

## 2020-08-22 DIAGNOSIS — M79675 Pain in left toe(s): Secondary | ICD-10-CM | POA: Diagnosis not present

## 2020-08-22 LAB — PHENYTOIN LEVEL, TOTAL: Phenytoin Lvl: 3.7 ug/mL — ABNORMAL LOW (ref 10.0–20.0)

## 2020-08-22 LAB — GLUCOSE, CAPILLARY
Glucose-Capillary: 151 mg/dL — ABNORMAL HIGH (ref 70–99)
Glucose-Capillary: 173 mg/dL — ABNORMAL HIGH (ref 70–99)

## 2020-08-22 MED ORDER — PHENYTOIN SODIUM EXTENDED 100 MG PO CAPS: 100.0000 mg | ORAL_CAPSULE | Freq: Every day | ORAL | Status: DC

## 2020-08-22 MED ORDER — OXYCODONE HCL 5 MG PO TABS: 2.5000 mg | ORAL_TABLET | Freq: Four times a day (QID) | ORAL | 0 refills | Status: DC | PRN

## 2020-08-22 MED ORDER — PHENYTOIN SODIUM EXTENDED 100 MG PO CAPS
200.0000 mg | ORAL_CAPSULE | ORAL | Status: DC
  Administered 2020-08-22: 200 mg via ORAL
  Filled 2020-08-22 (×2): qty 2

## 2020-08-22 MED ORDER — PHENYTOIN SODIUM EXTENDED 30 MG PO CAPS: 30.0000 mg | ORAL_CAPSULE | Freq: Every day | ORAL | Status: DC

## 2020-08-22 NOTE — Progress Notes (Signed)
Report called to Chinita Greenland, Therapist, sports at [facility].

## 2020-08-22 NOTE — Consult Note (Signed)
   [facility] Inpatient Consult   08/22/2020  [name] 1929-10-15 [id number]  [place] [facility] [ACO] Patient: Medicare CMS DCE  Primary Care Provider: Bartholome Bill, MD, is with [name], a non [facility] provider  Plan: Will alert [facility] PAC of non-[facility] provider and in the [facility] for primary care.  For questions or referrals, please contact:   Natividad Brood, RN BSN [facility] Liaison  [phone] business mobile phone Toll free office [phone]  Fax number: [phone] [email or web address]

## 2020-08-22 NOTE — Progress Notes (Signed)
Patient discharged to SNF via [facility]. 

## 2020-08-22 NOTE — Progress Notes (Signed)
Patient is being d/c'd to SNF on 4/21.  Will need dilantin level in 1 week. Eulogio Bear DO

## 2020-08-22 NOTE — TOC Transition Note (Addendum)
Transition of Care [facility]) - CM/SW Discharge Note   Patient Details  Name: [name] MRN: [id number] Date of Birth: 06-Aug-1929  Transition of Care [facility]) CM/SW Contact:  Bethann Berkshire, [place] Phone Number: 08/22/2020, 11:45 AM   Clinical Narrative:     Patient will DC to: [facility] Anticipated DC date: 08/22/20 Transport by: Corey Harold   Per MD patient ready for DC to [facility]. RN, patient, and facility notified of DC. Discharge Summary and FL2 sent to facility. RN to call report prior to discharge [phone] Room 100). DC packet on chart. Ambulance transport requested for patient.   CSW will sign off for now as social work intervention is no longer needed. Please consult [place] again if new needs arise.   Final next level of care: Skilled Nursing Facility Barriers to Discharge: No Barriers Identified   Patient Goals and CMS Choice Patient states their goals for this hospitalization and ongoing recovery are:: Pt wants SNF CMS Medicare.gov Compare Post Acute Care list provided to:: Patient Choice offered to / list presented to : Patient  Discharge Placement              Patient chooses bed at: [facility] Patient to be transferred to facility by: [facility]   Patient and family notified of of transfer: 08/22/20  Discharge Plan and Services                                     Social Determinants of Health (SDOH) Interventions     Readmission Risk Interventions Readmission Risk Prevention Plan 06/06/2020 03/15/2020 03/13/2020  Transportation Screening Complete Complete -  PCP or Specialist Appt within 3-5 Days - Complete Complete  HRI or Home Care Consult - - Complete  Social Work Consult for [place] Planning/Counseling - Complete Complete  Palliative Care Screening - Complete Complete  Medication Review Press photographer) - Complete Complete  PCP or Specialist appointment within 3-5 days of discharge Complete - -  SW Recovery Care/Counseling  Consult Complete - -  Palliative Care Screening Not Applicable - -  Some recent data might be hidden

## 2020-08-22 NOTE — Progress Notes (Signed)
MEDICATION RELATED CONSULT NOTE - FOLLOW UP   Pharmacy Consult for Phenytoin Indication: h/o seizures  Allergies  Allergen Reactions  . Bystolic [Nebivolol Hcl] Other (See Comments)    "extreme weakness, heaviness in legs & arms, swelling in legs/arms/face, swollen abdomen, pain in bladder, feet pain, soreness in chest"  . Carbamazepine Other (See Comments)    Blood poisoning  Other reaction(s): Unknown  . Cholestyramine Other (See Comments)    "itching rash on stomach, bloated, nausea, vomiting, sleeplessness, extreme pain in arms"  . Hydrazine Yellow [Tartrazine] Other (See Comments)    "does not reduce high blood pressure, pain in arm, high pressure, felt like I was on verge of heart attack, really weak"  . Morphine Other (See Comments)    Feels morbid, weak, still in pain  . Niacin Palpitations    Fast heart beat Other reaction(s): Unknown  . Niaspan [Niacin Er] Palpitations and Other (See Comments)    "fast heart beat, high blood pressure"  . Norvasc [Amlodipine Besylate] Other (See Comments)    "extreme fluid retention/pain)  . Optivar [Azelastine Hcl] Photosensitivity  . Repaglinide Hives  . Sular [Nisoldipine Er] Other (See Comments)    "severe headaches, swelling eyes, hands, feet, shortness of breath, weak, flushed face, brain boiling, fluid retention, high blood sugar, nervous, heart fast beating"  . Telmisartan Other (See Comments)    "headache, difficulty urinating, high blood sugar, fluid retention"  . Amoxicillin-Pot Clavulanate Rash and Other (See Comments)  . Cefdinir Swelling    Vaginal irritation, breathing,   . Ciprofloxacin Hcl Hives  . Clonidine Other (See Comments)    Dry mouth, fluid retention  . Clonidine Hydrochloride Other (See Comments)    Dry mouth, fluid retention  . Codeine Nausea And Vomiting  . Ezetimibe Other (See Comments)    Made weak Other reaction(s): Unknown  . Hydroxychloroquine Other (See Comments)    Low platlets  . Naproxen  Other (See Comments)    Shrinks bladder  . Sulfa Antibiotics Rash  . Ziac [Bisoprolol-Hydrochlorothiazide] Other (See Comments)    "stopped urination"  . Amlodipine Besy-Benazepril Hcl     Other reaction(s): cough  . Azelastine Other (See Comments)    Unknown reaction - Per MAR  . Elavil [Amitriptyline] Other (See Comments)    Gave Pt nightmares  . Empagliflozin Other (See Comments)    "Caused yeast infection, slowed my urine"  . Hydralazine Other (See Comments)    Unknown reaction - Per MAR  . Iodine I 131 Tositumomab Other (See Comments)    Unknown reaction - Per MAR  . Kenalog [Triamcinolone] Diarrhea    Per MAR  . Keppra [Levetiracetam] Other (See Comments)    Shaking  . Lamotrigine Itching and Other (See Comments)  . Norvasc [Amlodipine] Other (See Comments)    Unknown reaction - Per MAR  . Other Other (See Comments)    Other reaction(s): Unknown Other reaction(s): Unknown Other reaction(s): Unknown Other reaction(s): Unknown Other reaction(s): Unknown  . Pregabalin Swelling    Other reaction(s): weight gain  . Pseudoephedrine   . Pseudoephedrine-Guaifenesin Er Other (See Comments)    Unknown reaction - Per MAR  . Risedronate     Unknown reaction - Per MAR  . Ru-Hist D [Brompheniramine-Phenylephrine] Other (See Comments)    Unknown reaction - Per MAR  . Sumatriptan Other (See Comments)  . Topiramate Other (See Comments)    Dry eyes Other reaction(s): Unknown  . Ace Inhibitors Other (See Comments)    unknown  . Actonel [  Risedronate Sodium] Other (See Comments)    unknown  . Amlodipine Besylate Other (See Comments)    unknown  . Aspirin Other (See Comments)    unknown  . Atacand [Candesartan] Other (See Comments)    Unknown reaction - MAR  . Bextra [Valdecoxib] Other (See Comments)    Unknown reaction - Per MAR  . Bisoprolol-Hydrochlorothiazide Other (See Comments)    Unknown reaction - Per MAR  . Candesartan Cilexetil Other (See Comments)    unknown  .  Cefadroxil Other (See Comments)    unknown  . Celecoxib Rash  . Hydrocodone Other (See Comments)    unknown  . Hydrocodone-Acetaminophen Other (See Comments)    unknown  . Iodinated Diagnostic Agents Rash    "All over"  Other reaction(s): Unknown  . Meloxicam Other (See Comments)    unknown  . Methylprednisolone Sodium Succinate Other (See Comments)    unknown  . Nabumetone Other (See Comments)    Unknown reaction  . Penicillins Other (See Comments)    unknown  . Pseudoephedrine-Guaifenesin Other (See Comments)    unknown  . Risedronate Sodium Other (See Comments)    unknown  . Rofecoxib Other (See Comments)    Unknown reaction - MAR Other reaction(s): Unknown  . Ru-Tuss [Chlorphen-Pse-Atrop-Hyos-Scop] Other (See Comments)    unknown  . Sulfonamide Derivatives Other (See Comments)    unknown  . Sulphur [Elemental Sulfur] Other (See Comments)    unknown  . Telithromycin Other (See Comments)    unknown  . Terfenadine Other (See Comments)    Unknown reaction - Per MAR  . Trandolapril-Verapamil Hcl Er Other (See Comments)    Headache, difficulty urinating, high blood sugar, fluid retention  Pt is taking Tarka (trandolapril-verapamil) currently, but requests the medication stay in her allergy list  . Trandolapril-Verapamil Hcl Er Other (See Comments)    Headache, difficulty urinating, high blood sugar, fluid retention  Pt is taking Tarka (trandolapril-verapamil) currently, but requests the medication stay in her allergy list  . Valium [Diazepam] Other (See Comments)    Makes her mean and hyper    Patient Measurements: Height: 5\' 7"  (170.2 cm) Weight: 61.5 kg (135 lb 9.3 oz) IBW/kg (Calculated) : 61.6  Vital Signs: Temp: 97.9 F (36.6 C) (04/21 0529) Temp Source: Oral (04/21 0529) BP: 130/61 (04/21 0529) Pulse Rate: 62 (04/21 0529) Intake/Output from previous day: 04/20 0701 - 04/21 0700 In: -  Out: 1200 [Urine:1200] Intake/Output from this shift: No  intake/output data recorded.  Labs: No results for input(s): WBC, HGB, HCT, PLT, APTT, CREATININE, LABCREA, CREATININE, CREAT24HRUR, MG, PHOS, ALBUMIN, PROT, ALBUMIN, AST, ALT, ALKPHOS, BILITOT, BILIDIR, IBILI in the last 72 hours. Estimated Creatinine Clearance: 47.2 mL/min (by C-G formula based on SCr of 0.72 mg/dL).   Microbiology: Recent Results (from the past 720 hour(s))  Culture, Urine     Status: Abnormal   Collection Time: 08/17/20  4:47 AM   Specimen: Urine, Random  Result Value Ref Range Status   Specimen Description URINE, RANDOM  Final   Special Requests   Final    NONE Performed at [facility], [address] [place],  [address]    Culture >=100,000 COLONIES/mL KLEBSIELLA PNEUMONIAE (A)  Final   Report Status 08/19/2020 FINAL  Final   Organism ID, Bacteria KLEBSIELLA PNEUMONIAE (A)  Final      Susceptibility   Klebsiella pneumoniae - MIC*    AMPICILLIN RESISTANT Resistant     CEFAZOLIN <=4 SENSITIVE Sensitive  CEFEPIME <=0.12 SENSITIVE Sensitive     CEFTRIAXONE <=0.25 SENSITIVE Sensitive     CIPROFLOXACIN <=0.25 SENSITIVE Sensitive     GENTAMICIN <=1 SENSITIVE Sensitive     IMIPENEM <=0.25 SENSITIVE Sensitive     NITROFURANTOIN 32 SENSITIVE Sensitive     TRIMETH/SULFA <=20 SENSITIVE Sensitive     AMPICILLIN/SULBACTAM 4 SENSITIVE Sensitive     PIP/TAZO <=4 SENSITIVE Sensitive     * >=100,000 COLONIES/mL KLEBSIELLA PNEUMONIAE  SARS CORONAVIRUS 2 (TAT 6-24 HRS) Nasopharyngeal Nasopharyngeal Swab     Status: None   Collection Time: 08/17/20  4:57 AM   Specimen: Nasopharyngeal Swab  Result Value Ref Range Status   SARS Coronavirus 2 NEGATIVE NEGATIVE Final    Comment: (NOTE) SARS-CoV-2 target nucleic acids are NOT DETECTED.  The SARS-CoV-2 RNA is generally detectable in upper and lower respiratory specimens during the acute phase of infection. Negative results do not preclude SARS-CoV-2 infection, do not rule out co-infections with other  pathogens, and should not be used as the sole basis for treatment or other patient management decisions. Negative results must be combined with clinical observations, patient history, and epidemiological information. The expected result is Negative.  Fact Sheet for Patients: SugarRoll.be  Fact Sheet for Healthcare Providers: https://www.woods-mathews.com/  This test is not yet approved or cleared by the [place] FDA and  has been authorized for detection and/or diagnosis of SARS-CoV-2 by FDA under an Emergency Use Authorization (EUA). This EUA will remain  in effect (meaning this test can be used) for the duration of the COVID-19 declaration under Se ction 564(b)(1) of the Act, 21 U.S.C. section 360bbb-3(b)(1), unless the authorization is terminated or revoked sooner.  Performed at [facility], [place] [address] [place], [place] [address]     Medications:  See electronic med rec   Assessment: 87 y.o. F presents s/p fall. Pt on phenytoin 150mg  in the morning and 200mg  in the evening PTA for h/o seizures. Also on primidone 100mg  BID. Phenytoin level 21.9 (supratherapeutic), albumin 3.9 on admission. Question whether falls could be related to phenytoin toxicity. No active seizures  Phenytoin level down to 13.9 mcg/ml (in therapeutic range).  Dose has been slightly reduced to 130mg  qAM and 200mg  qHS. Level came back at 3.7 this AM>>4.2 corrected for albumin. This is likely due to the one missed dose on the 4/18. We will give her a small oral load and continue with the current dose. Consider checking another level next week if she is discharged.  Goal of Therapy:  Phenytoin level 10-20 mcg/ml; no seizures  Plan:  Dilantin 200mg  PO q4 x2  Continue Dilantin 200mg  PO qHS Continue Dilantin 130mg  PO qAM Level next week as outpt  Onnie Boer, PharmD, BCIDP, AAHIVP, CPP Infectious Disease Pharmacist 08/22/2020 8:28 AM

## 2020-08-23 DIAGNOSIS — E1159 Type 2 diabetes mellitus with other circulatory complications: Secondary | ICD-10-CM | POA: Diagnosis not present

## 2020-08-23 DIAGNOSIS — S22070A Wedge compression fracture of T9-T10 vertebra, initial encounter for closed fracture: Secondary | ICD-10-CM | POA: Diagnosis not present

## 2020-08-23 DIAGNOSIS — G40909 Epilepsy, unspecified, not intractable, without status epilepticus: Secondary | ICD-10-CM | POA: Diagnosis not present

## 2020-08-23 DIAGNOSIS — I152 Hypertension secondary to endocrine disorders: Secondary | ICD-10-CM | POA: Diagnosis not present

## 2020-08-29 DIAGNOSIS — Z9181 History of falling: Secondary | ICD-10-CM | POA: Diagnosis not present

## 2020-08-29 DIAGNOSIS — G40909 Epilepsy, unspecified, not intractable, without status epilepticus: Secondary | ICD-10-CM | POA: Diagnosis not present

## 2020-08-29 DIAGNOSIS — E1159 Type 2 diabetes mellitus with other circulatory complications: Secondary | ICD-10-CM | POA: Diagnosis not present

## 2020-08-29 DIAGNOSIS — S22070A Wedge compression fracture of T9-T10 vertebra, initial encounter for closed fracture: Secondary | ICD-10-CM | POA: Diagnosis not present

## 2020-09-03 ENCOUNTER — Other Ambulatory Visit: Payer: Self-pay | Admitting: *Deleted

## 2020-09-03 ENCOUNTER — Telehealth: Payer: Self-pay | Admitting: Neurology

## 2020-09-03 DIAGNOSIS — Z5181 Encounter for therapeutic drug level monitoring: Secondary | ICD-10-CM

## 2020-09-03 NOTE — Patient Outreach (Signed)
Member screened for potential [facility] Care Management needs.  Mrs. Wiginton resides in [facility]. Communication sent to [facility] to inquire about transition plans.   Will continue to follow for potential [facility] Care Management needs.    Marthenia Rolling, MSN, RN,BSN [place] Acute Care Coordinator [phone] [facility]) [phone]  (Toll free office)

## 2020-09-03 NOTE — Telephone Encounter (Signed)
I called the patient.  She is to come in and get a Dilantin level checked through our office.

## 2020-09-04 ENCOUNTER — Other Ambulatory Visit: Payer: Self-pay | Admitting: *Deleted

## 2020-09-04 NOTE — Patient Outreach (Signed)
[place] Coordinator follow up. Member screened for potential [facility] Care Management needs. Mrs. Raysor resides in [facility].   Update received from [facility] indicating Mrs. Rayle's transition plan is to return home with private care giver for 8 hrs a day 7 days a week.   Will continue to follow for potential [facility] Care Management services while member resides in SNF.    Marthenia Rolling, MSN, RN,BSN [place] Acute Care Coordinator [phone] [facility]) [phone]  (Toll free office)

## 2020-09-09 ENCOUNTER — Other Ambulatory Visit: Payer: Self-pay

## 2020-09-09 ENCOUNTER — Ambulatory Visit (INDEPENDENT_AMBULATORY_CARE_PROVIDER_SITE_OTHER): Payer: Medicare Other | Admitting: Podiatry

## 2020-09-09 ENCOUNTER — Encounter: Payer: Self-pay | Admitting: Podiatry

## 2020-09-09 DIAGNOSIS — L84 Corns and callosities: Secondary | ICD-10-CM

## 2020-09-09 DIAGNOSIS — B351 Tinea unguium: Secondary | ICD-10-CM

## 2020-09-09 DIAGNOSIS — M2042 Other hammer toe(s) (acquired), left foot: Secondary | ICD-10-CM

## 2020-09-09 DIAGNOSIS — M79675 Pain in left toe(s): Secondary | ICD-10-CM | POA: Diagnosis not present

## 2020-09-09 DIAGNOSIS — E1142 Type 2 diabetes mellitus with diabetic polyneuropathy: Secondary | ICD-10-CM

## 2020-09-09 DIAGNOSIS — M2041 Other hammer toe(s) (acquired), right foot: Secondary | ICD-10-CM

## 2020-09-09 DIAGNOSIS — M79674 Pain in right toe(s): Secondary | ICD-10-CM | POA: Diagnosis not present

## 2020-09-10 DIAGNOSIS — I152 Hypertension secondary to endocrine disorders: Secondary | ICD-10-CM | POA: Diagnosis not present

## 2020-09-10 DIAGNOSIS — G40909 Epilepsy, unspecified, not intractable, without status epilepticus: Secondary | ICD-10-CM | POA: Diagnosis not present

## 2020-09-10 DIAGNOSIS — S22070A Wedge compression fracture of T9-T10 vertebra, initial encounter for closed fracture: Secondary | ICD-10-CM | POA: Diagnosis not present

## 2020-09-10 DIAGNOSIS — E1159 Type 2 diabetes mellitus with other circulatory complications: Secondary | ICD-10-CM | POA: Diagnosis not present

## 2020-09-12 DIAGNOSIS — Z96651 Presence of right artificial knee joint: Secondary | ICD-10-CM | POA: Diagnosis not present

## 2020-09-12 DIAGNOSIS — S22089D Unspecified fracture of T11-T12 vertebra, subsequent encounter for fracture with routine healing: Secondary | ICD-10-CM | POA: Diagnosis not present

## 2020-09-12 DIAGNOSIS — S72001D Fracture of unspecified part of neck of right femur, subsequent encounter for closed fracture with routine healing: Secondary | ICD-10-CM | POA: Diagnosis not present

## 2020-09-12 DIAGNOSIS — M2042 Other hammer toe(s) (acquired), left foot: Secondary | ICD-10-CM | POA: Diagnosis not present

## 2020-09-12 DIAGNOSIS — F32A Depression, unspecified: Secondary | ICD-10-CM | POA: Diagnosis not present

## 2020-09-12 DIAGNOSIS — M8580 Other specified disorders of bone density and structure, unspecified site: Secondary | ICD-10-CM | POA: Diagnosis not present

## 2020-09-12 DIAGNOSIS — G40909 Epilepsy, unspecified, not intractable, without status epilepticus: Secondary | ICD-10-CM | POA: Diagnosis not present

## 2020-09-12 DIAGNOSIS — I10 Essential (primary) hypertension: Secondary | ICD-10-CM | POA: Diagnosis not present

## 2020-09-12 DIAGNOSIS — M4050 Lordosis, unspecified, site unspecified: Secondary | ICD-10-CM | POA: Diagnosis not present

## 2020-09-12 DIAGNOSIS — M5137 Other intervertebral disc degeneration, lumbosacral region: Secondary | ICD-10-CM | POA: Diagnosis not present

## 2020-09-12 DIAGNOSIS — M47816 Spondylosis without myelopathy or radiculopathy, lumbar region: Secondary | ICD-10-CM | POA: Diagnosis not present

## 2020-09-12 DIAGNOSIS — M48061 Spinal stenosis, lumbar region without neurogenic claudication: Secondary | ICD-10-CM | POA: Diagnosis not present

## 2020-09-12 DIAGNOSIS — C50119 Malignant neoplasm of central portion of unspecified female breast: Secondary | ICD-10-CM | POA: Diagnosis not present

## 2020-09-12 DIAGNOSIS — M47812 Spondylosis without myelopathy or radiculopathy, cervical region: Secondary | ICD-10-CM | POA: Diagnosis not present

## 2020-09-12 DIAGNOSIS — M4312 Spondylolisthesis, cervical region: Secondary | ICD-10-CM | POA: Diagnosis not present

## 2020-09-12 DIAGNOSIS — J45909 Unspecified asthma, uncomplicated: Secondary | ICD-10-CM | POA: Diagnosis not present

## 2020-09-12 DIAGNOSIS — F419 Anxiety disorder, unspecified: Secondary | ICD-10-CM | POA: Diagnosis not present

## 2020-09-12 DIAGNOSIS — M810 Age-related osteoporosis without current pathological fracture: Secondary | ICD-10-CM | POA: Diagnosis not present

## 2020-09-12 DIAGNOSIS — M4802 Spinal stenosis, cervical region: Secondary | ICD-10-CM | POA: Diagnosis not present

## 2020-09-12 DIAGNOSIS — R519 Headache, unspecified: Secondary | ICD-10-CM | POA: Diagnosis not present

## 2020-09-12 DIAGNOSIS — D630 Anemia in neoplastic disease: Secondary | ICD-10-CM | POA: Diagnosis not present

## 2020-09-12 DIAGNOSIS — I2510 Atherosclerotic heart disease of native coronary artery without angina pectoris: Secondary | ICD-10-CM | POA: Diagnosis not present

## 2020-09-12 DIAGNOSIS — M2041 Other hammer toe(s) (acquired), right foot: Secondary | ICD-10-CM | POA: Diagnosis not present

## 2020-09-12 DIAGNOSIS — M4316 Spondylolisthesis, lumbar region: Secondary | ICD-10-CM | POA: Diagnosis not present

## 2020-09-12 DIAGNOSIS — E119 Type 2 diabetes mellitus without complications: Secondary | ICD-10-CM | POA: Diagnosis not present

## 2020-09-13 DIAGNOSIS — Z96651 Presence of right artificial knee joint: Secondary | ICD-10-CM | POA: Diagnosis not present

## 2020-09-13 DIAGNOSIS — S72001D Fracture of unspecified part of neck of right femur, subsequent encounter for closed fracture with routine healing: Secondary | ICD-10-CM | POA: Diagnosis not present

## 2020-09-13 DIAGNOSIS — I2510 Atherosclerotic heart disease of native coronary artery without angina pectoris: Secondary | ICD-10-CM | POA: Diagnosis not present

## 2020-09-13 DIAGNOSIS — S22089D Unspecified fracture of T11-T12 vertebra, subsequent encounter for fracture with routine healing: Secondary | ICD-10-CM | POA: Diagnosis not present

## 2020-09-13 DIAGNOSIS — G40909 Epilepsy, unspecified, not intractable, without status epilepticus: Secondary | ICD-10-CM | POA: Diagnosis not present

## 2020-09-13 DIAGNOSIS — E119 Type 2 diabetes mellitus without complications: Secondary | ICD-10-CM | POA: Diagnosis not present

## 2020-09-15 NOTE — Progress Notes (Signed)
Subjective: [name] is a pleasant 87 y.o. female patient seen today for diabetic foot care with h/o  painful thick toenails that are difficult to trim. Pain interferes with ambulation. Aggravating factors include wearing enclosed shoe gear. Pain is relieved with periodic professional debridement.  Her blood glucose was 113 mg/dl.  She is accompanied by her sister on today's visit.   PCP is Dr. Precious Haws and last visit was 05/27/2020.  She voices no new pedal concerns on today's visit.  Allergies  Allergen Reactions  . Bystolic [Nebivolol Hcl] Other (See Comments)    "extreme weakness, heaviness in legs & arms, swelling in legs/arms/face, swollen abdomen, pain in bladder, feet pain, soreness in chest"  . Carbamazepine Other (See Comments)    Blood poisoning  Other reaction(s): Unknown  . Cholestyramine Other (See Comments)    "itching rash on stomach, bloated, nausea, vomiting, sleeplessness, extreme pain in arms"  . Hydrazine Yellow [Tartrazine] Other (See Comments)    "does not reduce high blood pressure, pain in arm, high pressure, felt like I was on verge of heart attack, really weak"  . Morphine Other (See Comments)    Feels morbid, weak, still in pain  . Niacin Palpitations    Fast heart beat Other reaction(s): Unknown  . Niaspan [Niacin Er] Palpitations and Other (See Comments)    "fast heart beat, high blood pressure"  . Norvasc [Amlodipine Besylate] Other (See Comments)    "extreme fluid retention/pain)  . Optivar [Azelastine Hcl] Photosensitivity  . Repaglinide Hives  . Sular [Nisoldipine Er] Other (See Comments)    "severe headaches, swelling eyes, hands, feet, shortness of breath, weak, flushed face, brain boiling, fluid retention, high blood sugar, nervous, heart fast beating"  . Telmisartan Other (See Comments)    "headache, difficulty urinating, high blood sugar, fluid retention"  . Amoxicillin-Pot Clavulanate Rash and Other (See Comments)  . Cefdinir Swelling     Vaginal irritation, breathing,   . Ciprofloxacin Hcl Hives  . Clonidine Other (See Comments)    Dry mouth, fluid retention  . Clonidine Hydrochloride Other (See Comments)    Dry mouth, fluid retention  . Codeine Nausea And Vomiting  . Ezetimibe Other (See Comments)    Made weak Other reaction(s): Unknown  . Hydroxychloroquine Other (See Comments)    Low platlets  . Naproxen Other (See Comments)    Shrinks bladder  . Sulfa Antibiotics Rash  . Ziac [Bisoprolol-Hydrochlorothiazide] Other (See Comments)    "stopped urination"  . Amlodipine Besy-Benazepril Hcl     Other reaction(s): cough  . Azelastine Other (See Comments)    Unknown reaction - Per MAR  . Elavil [Amitriptyline] Other (See Comments)    Gave Pt nightmares  . Empagliflozin Other (See Comments)    "Caused yeast infection, slowed my urine"  . Hydralazine Other (See Comments)    Unknown reaction - Per MAR  . Iodine I 131 Tositumomab Other (See Comments)    Unknown reaction - Per MAR  . Kenalog [Triamcinolone] Diarrhea    Per MAR  . Keppra [Levetiracetam] Other (See Comments)    Shaking  . Lamotrigine Itching and Other (See Comments)  . Norvasc [Amlodipine] Other (See Comments)    Unknown reaction - Per MAR  . Other Other (See Comments)    Other reaction(s): Unknown Other reaction(s): Unknown Other reaction(s): Unknown Other reaction(s): Unknown Other reaction(s): Unknown  . Pregabalin Swelling    Other reaction(s): weight gain  . Pseudoephedrine   . Pseudoephedrine-Guaifenesin Er Other (See Comments)  Unknown reaction - Per MAR  . Risedronate     Unknown reaction - Per MAR  . Ru-Hist D [Brompheniramine-Phenylephrine] Other (See Comments)    Unknown reaction - Per MAR  . Sumatriptan Other (See Comments)  . Topiramate Other (See Comments)    Dry eyes Other reaction(s): Unknown  . Ace Inhibitors Other (See Comments)    unknown  . Actonel [Risedronate Sodium] Other (See Comments)    unknown  .  Amlodipine Besylate Other (See Comments)    unknown  . Aspirin Other (See Comments)    unknown  . Atacand [Candesartan] Other (See Comments)    Unknown reaction - MAR  . Bextra [Valdecoxib] Other (See Comments)    Unknown reaction - Per MAR  . Bisoprolol-Hydrochlorothiazide Other (See Comments)    Unknown reaction - Per MAR  . Candesartan Cilexetil Other (See Comments)    unknown  . Cefadroxil Other (See Comments)    unknown  . Celecoxib Rash  . Hydrocodone Other (See Comments)    unknown  . Hydrocodone-Acetaminophen Other (See Comments)    unknown  . Iodinated Diagnostic Agents Rash    "All over"  Other reaction(s): Unknown  . Meloxicam Other (See Comments)    unknown  . Methylprednisolone Sodium Succinate Other (See Comments)    unknown  . Nabumetone Other (See Comments)    Unknown reaction  . Penicillins Other (See Comments)    unknown  . Pseudoephedrine-Guaifenesin Other (See Comments)    unknown  . Risedronate Sodium Other (See Comments)    unknown  . Rofecoxib Other (See Comments)    Unknown reaction - MAR Other reaction(s): Unknown  . Ru-Tuss [Chlorphen-Pse-Atrop-Hyos-Scop] Other (See Comments)    unknown  . Sulfonamide Derivatives Other (See Comments)    unknown  . Sulphur [Elemental Sulfur] Other (See Comments)    unknown  . Telithromycin Other (See Comments)    unknown  . Terfenadine Other (See Comments)    Unknown reaction - Per MAR  . Trandolapril-Verapamil Hcl Er Other (See Comments)    Headache, difficulty urinating, high blood sugar, fluid retention  Pt is taking Tarka (trandolapril-verapamil) currently, but requests the medication stay in her allergy list  . Trandolapril-Verapamil Hcl Er Other (See Comments)    Headache, difficulty urinating, high blood sugar, fluid retention  Pt is taking Tarka (trandolapril-verapamil) currently, but requests the medication stay in her allergy list  . Valium [Diazepam] Other (See Comments)    Makes her mean  and hyper    Objective: Physical Exam  General: [name] is a pleasant 87 y.o. Caucasian female, WD, WN in NAD. AAO x 3.   Vascular:  Capillary refill time to digits <3 seconds b/l. DP/PT pulses 1/4 b/l.  Pedal hair absent  b/l lower extremities. Lower extremity skin temperature gradient within normal limits. No pain with calf compression b/l. No edema noted b/l lower extremities.   Dermatological:  Pedal skin with normal turgor, texture and tone bilaterally. No open wounds bilaterally. No interdigital macerations bilaterally. Toenails 1-5 b/l elongated, discolored, dystrophic, thickened, crumbly with subungual debris and tenderness to dorsal palpation.   Hyperkeratotic le sion submet head 1 b/l and distal tip left 2nd digit with tenderness to palpation. No edema, no erythema, no drainage, no fluctuance.  Musculoskeletal:  Normal muscle strength 5/5 to all lower extremity muscle groups bilaterally. No pain crepitus or joint limitation noted with ROM b/l. Hammertoes noted to the 2-5 bilaterally.  Neurological:  Protective sensation diminished bilaterally with 10g monofilament b/l. Clonus  negative b/l.  Assessment and Plan:  1. Pain due to onychomycosis of toenails of both feet   2. Corns and callosities   3. Acquired hammertoes of both feet   4. Diabetic peripheral neuropathy associated with type 2 diabetes mellitus ([place])     -Examined patient. -No new findings. No new orders. -Continue diabetic foot care principles. -Patient to continue soft, supportive shoe gear daily. -Toenails 1-5 b/l were debrided in length and girth with sterile nail nippers and dremel without iatrogenic bleeding.  -Corn(s) L 2nd toe and callus(es) submet head 1 left foot and submet head 1 right foot were pared utilizing sterile scalpel blade without incident. Total number debrided =3. -Patient to report any pedal injuries to medical professional immediately. -Patient/POA to call should there be  question/concern in the interim.  Return in about 3 months (around 12/10/2020).  Marzetta Board, DPM

## 2020-09-16 ENCOUNTER — Other Ambulatory Visit: Payer: Self-pay | Admitting: *Deleted

## 2020-09-16 NOTE — Patient Outreach (Signed)
[place] Coordinator follow up. Member screened for potential [facility] Care Management needs.  Verified in [place] (Patient [name]) [name] transitioned from [facility] on 09/11/20. [place] was arranged.   Telephone call made to [name] [phone]. Patient identifiers confirmed. [name] reports she has been home since last week. [facility] has had visit once so far. [name] lives alone. States her niece who was assisting her on Mondays and Tuesdays but now has covid. States her sister is staying with her for the time being.   [name] reports PCP appointment with Dr. Luciana Axe is on tomorrow.  She will have transportation.  Explained [place] Management follow up. [name] is agreeable.   [name] has several recent hospitalizations and SNF stays. [name] has medical history of CAD, DM, HTN, HLD, falls, seizures, anxiety, breast cancer, colon cancer, GERD.  Will make referral for [place] for care coordination.    Marthenia Rolling, MSN, RN,BSN [place] Acute Care Coordinator [phone] [facility]) [phone]  (Toll free office)

## 2020-09-17 DIAGNOSIS — I10 Essential (primary) hypertension: Secondary | ICD-10-CM | POA: Diagnosis not present

## 2020-09-17 DIAGNOSIS — Z0000 Encounter for general adult medical examination without abnormal findings: Secondary | ICD-10-CM | POA: Diagnosis not present

## 2020-09-18 ENCOUNTER — Encounter: Payer: Self-pay | Admitting: *Deleted

## 2020-09-18 ENCOUNTER — Other Ambulatory Visit: Payer: Self-pay | Admitting: *Deleted

## 2020-09-18 NOTE — Patient Outreach (Signed)
[facility]) Care Management  09/18/2020  [name] 19-Feb-1930 [id number]  Referral Received 5/16 SNF discharge Telephone Assessment Outreach #1  RN spoke with pt today post SNF and introduced [facility] services and purpose for today's call. Pt receptive and express her involved with HHPT and pending sitter services. Pt states her sister will be sitting today and tomorrow [facility] [employer] will send a CNA to assist further. Pt has several family member to assist with her ADL and transporttaion source to all her medical appointments. Pt has had diabetes for over 20 yrs and continue to manage this condition very well. Reports her daily glucose level under 100 with her last A1c at 6.7 in Feb. Pt within acute needs at this time and prefers monthly follow up call vs weekly. Pt communicates with all her providers on her last admission and continue to attend all medical appointments with no delays.   Will educate on diabetes once again and enrolled into the [facility] program for diabetes (pt receptive). Will generate a plan of care and discuss all goals and interventions. Will provide needed resources and alert pt of other disclipines available via [facility] if needed in the future for social workers and pharmacy. Will send appropriate letters via [facility] along with a calendar and DM information packet for review. All inquires and questions address with no additional needs presented at this time.   Will alert pt's provider and follow up next month with ongoing care management services.   Goals Addressed            This Visit's Progress   . [facility]-Maintain Quality of Life       Timeframe:  Long-Range Goal Priority:  Medium Start Date:  09/18/2020                           Expected End Date:  01/01/2021                     Follow Up Date 10/18/2020   - check out options for in-home help, long-term care or hospice - do one enjoyable thing every day - do something different, like talking to a new person or  going to a new place, every day - share memories using a picture album or scrapbook with my loved ones - spend time outdoors at least 3 times a week  Barriers: Health Behaviors     Why is this important?    Having a long-term illness can be scary.   It can also be stressful for you and your caregiver.   These steps Schepers help.    Notes:  5/18-Discussed her currently caregiver with her sister staying with her tonight. Pt will utilize [employer] starting tomorrow. Pt indicates she always has someone with her throughput most of the day and other assistance from family members. Pt feels she is very entertained throughout the day.     Linward Headland and Keep All Appointments       Timeframe:  Short-Term Goal Priority:  Medium Start Date:   09/18/2020                          Expected End Date:  10/31/2020                     Follow Up Date 10/18/2020   - arrange a ride through an agency 1 week before appointment -  ask family or friend for a ride - call to cancel if needed - keep a calendar with prescription refill dates - keep a calendar with appointment dates   Barriers: Health Behaviors  Why is this important?    Part of staying healthy is seeing the doctor for follow-up care.   If you forget your appointments, there are some things you can do to stay on track.    Notes:  5/18-Verified pt has sufficient transition to all medical appointments. Will remain available for additional resources if needed.    . [facility]-Monitor and Manage My Blood Sugar-Diabetes Type 2       Timeframe:  Short-Term Goal Priority:  Medium Start Date:  09/18/2020                           Expected End Date:  10/31/2020                     Follow Up Date 10/18/2020    - check blood sugar at prescribed times - check blood sugar before and after exercise - check blood sugar if I feel it is too high or too low - enter blood sugar readings and medication or insulin into daily log - take the blood sugar log to all  doctor visits - take the blood sugar meter to all doctor visits Barriers: Health Behaviors      Why is this important?    Checking your blood sugar at home helps to keep it from getting very high or very low.   Writing the results in a diary or log helps the doctor know how to care for you.   Your blood sugar log should have the time, date and the results.   Also, write down the amount of insulin or other medicine that you take.   Other information, like what you ate, exercise done and how you were feeling, will also be helpful.     Notes:  5/17- Pt reports her readings around 97 on CBG with no hypo-hyperglycemia episodes. Pt reports her glucose levels are much better with no acute issues. Will continue to encouraged adherence with her ongoing monitoring.    . [facility]-Set My Target A1C-Diabetes Type 2       Timeframe:  Long-Range Goal Priority:  Medium Start Date:  09/18/2020                           Expected End Date:  01/01/2021                     Follow Up Date 10/18/2020    - set target A1C Barriers: Health Behaviors     Why is this important?    Your target A1C is decided together by you and your doctor.   It is based on several things like your age and other health issues.    Notes: 5/17- Last A1C at 6.7 Feb pending update soon with endocrinologist.        Raina Mina, RN Care Management Coordinator [place] Office [phone]

## 2020-09-19 ENCOUNTER — Other Ambulatory Visit (INDEPENDENT_AMBULATORY_CARE_PROVIDER_SITE_OTHER): Payer: Self-pay

## 2020-09-19 ENCOUNTER — Telehealth: Payer: Self-pay | Admitting: Neurology

## 2020-09-19 DIAGNOSIS — Z0289 Encounter for other administrative examinations: Secondary | ICD-10-CM

## 2020-09-19 DIAGNOSIS — Z5181 Encounter for therapeutic drug level monitoring: Secondary | ICD-10-CM | POA: Diagnosis not present

## 2020-09-19 DIAGNOSIS — I2510 Atherosclerotic heart disease of native coronary artery without angina pectoris: Secondary | ICD-10-CM | POA: Diagnosis not present

## 2020-09-19 DIAGNOSIS — E119 Type 2 diabetes mellitus without complications: Secondary | ICD-10-CM | POA: Diagnosis not present

## 2020-09-19 DIAGNOSIS — S22089D Unspecified fracture of T11-T12 vertebra, subsequent encounter for fracture with routine healing: Secondary | ICD-10-CM | POA: Diagnosis not present

## 2020-09-19 DIAGNOSIS — G40909 Epilepsy, unspecified, not intractable, without status epilepticus: Secondary | ICD-10-CM | POA: Diagnosis not present

## 2020-09-19 DIAGNOSIS — S72001D Fracture of unspecified part of neck of right femur, subsequent encounter for closed fracture with routine healing: Secondary | ICD-10-CM | POA: Diagnosis not present

## 2020-09-19 DIAGNOSIS — Z96651 Presence of right artificial knee joint: Secondary | ICD-10-CM | POA: Diagnosis not present

## 2020-09-19 NOTE — Telephone Encounter (Signed)
Patient came in for labs today and also requesting refill of alprazolam. Best call back is [phone].

## 2020-09-20 ENCOUNTER — Telehealth: Payer: Self-pay | Admitting: Neurology

## 2020-09-20 LAB — PHENYTOIN LEVEL, TOTAL: Phenytoin (Dilantin), Serum: 7.7 ug/mL — ABNORMAL LOW (ref 10.0–20.0)

## 2020-09-20 MED ORDER — ALPRAZOLAM 0.25 MG PO TABS
0.2500 mg | ORAL_TABLET | Freq: Three times a day (TID) | ORAL | 1 refills | Status: DC | PRN
Start: 2020-09-20 — End: 2021-01-01

## 2020-09-20 NOTE — Telephone Encounter (Signed)
I called the patient.  Dilantin level 7.7, no change in medical therapy is recommended.  She wishes to have a prescription for her alprazolam, I will send one in, the last prescription was called in in July 2021.

## 2020-09-20 NOTE — Telephone Encounter (Signed)
The Xanax prescription was refilled.

## 2020-09-21 DIAGNOSIS — M25512 Pain in left shoulder: Secondary | ICD-10-CM | POA: Diagnosis not present

## 2020-09-21 DIAGNOSIS — M542 Cervicalgia: Secondary | ICD-10-CM | POA: Diagnosis not present

## 2020-09-23 DIAGNOSIS — E119 Type 2 diabetes mellitus without complications: Secondary | ICD-10-CM | POA: Diagnosis not present

## 2020-09-23 DIAGNOSIS — Z96651 Presence of right artificial knee joint: Secondary | ICD-10-CM | POA: Diagnosis not present

## 2020-09-23 DIAGNOSIS — G40909 Epilepsy, unspecified, not intractable, without status epilepticus: Secondary | ICD-10-CM | POA: Diagnosis not present

## 2020-09-23 DIAGNOSIS — S22089D Unspecified fracture of T11-T12 vertebra, subsequent encounter for fracture with routine healing: Secondary | ICD-10-CM | POA: Diagnosis not present

## 2020-09-23 DIAGNOSIS — S72001D Fracture of unspecified part of neck of right femur, subsequent encounter for closed fracture with routine healing: Secondary | ICD-10-CM | POA: Diagnosis not present

## 2020-09-23 DIAGNOSIS — I2510 Atherosclerotic heart disease of native coronary artery without angina pectoris: Secondary | ICD-10-CM | POA: Diagnosis not present

## 2020-09-24 DIAGNOSIS — M542 Cervicalgia: Secondary | ICD-10-CM | POA: Diagnosis not present

## 2020-09-24 DIAGNOSIS — S22089D Unspecified fracture of T11-T12 vertebra, subsequent encounter for fracture with routine healing: Secondary | ICD-10-CM | POA: Diagnosis not present

## 2020-09-24 DIAGNOSIS — I2510 Atherosclerotic heart disease of native coronary artery without angina pectoris: Secondary | ICD-10-CM | POA: Diagnosis not present

## 2020-09-24 DIAGNOSIS — S72031D Displaced midcervical fracture of right femur, subsequent encounter for closed fracture with routine healing: Secondary | ICD-10-CM | POA: Diagnosis not present

## 2020-09-24 DIAGNOSIS — S72001D Fracture of unspecified part of neck of right femur, subsequent encounter for closed fracture with routine healing: Secondary | ICD-10-CM | POA: Diagnosis not present

## 2020-09-24 DIAGNOSIS — G40909 Epilepsy, unspecified, not intractable, without status epilepticus: Secondary | ICD-10-CM | POA: Diagnosis not present

## 2020-09-24 DIAGNOSIS — Z96651 Presence of right artificial knee joint: Secondary | ICD-10-CM | POA: Diagnosis not present

## 2020-09-24 DIAGNOSIS — E119 Type 2 diabetes mellitus without complications: Secondary | ICD-10-CM | POA: Diagnosis not present

## 2020-09-26 DIAGNOSIS — E119 Type 2 diabetes mellitus without complications: Secondary | ICD-10-CM | POA: Diagnosis not present

## 2020-09-26 DIAGNOSIS — Z96651 Presence of right artificial knee joint: Secondary | ICD-10-CM | POA: Diagnosis not present

## 2020-09-26 DIAGNOSIS — S72001D Fracture of unspecified part of neck of right femur, subsequent encounter for closed fracture with routine healing: Secondary | ICD-10-CM | POA: Diagnosis not present

## 2020-09-26 DIAGNOSIS — I2510 Atherosclerotic heart disease of native coronary artery without angina pectoris: Secondary | ICD-10-CM | POA: Diagnosis not present

## 2020-09-26 DIAGNOSIS — G40909 Epilepsy, unspecified, not intractable, without status epilepticus: Secondary | ICD-10-CM | POA: Diagnosis not present

## 2020-09-26 DIAGNOSIS — S22089D Unspecified fracture of T11-T12 vertebra, subsequent encounter for fracture with routine healing: Secondary | ICD-10-CM | POA: Diagnosis not present

## 2020-10-01 DIAGNOSIS — E119 Type 2 diabetes mellitus without complications: Secondary | ICD-10-CM | POA: Diagnosis not present

## 2020-10-01 DIAGNOSIS — G40909 Epilepsy, unspecified, not intractable, without status epilepticus: Secondary | ICD-10-CM | POA: Diagnosis not present

## 2020-10-01 DIAGNOSIS — S72001D Fracture of unspecified part of neck of right femur, subsequent encounter for closed fracture with routine healing: Secondary | ICD-10-CM | POA: Diagnosis not present

## 2020-10-01 DIAGNOSIS — Z96651 Presence of right artificial knee joint: Secondary | ICD-10-CM | POA: Diagnosis not present

## 2020-10-01 DIAGNOSIS — S22089D Unspecified fracture of T11-T12 vertebra, subsequent encounter for fracture with routine healing: Secondary | ICD-10-CM | POA: Diagnosis not present

## 2020-10-01 DIAGNOSIS — I2510 Atherosclerotic heart disease of native coronary artery without angina pectoris: Secondary | ICD-10-CM | POA: Diagnosis not present

## 2020-10-02 DIAGNOSIS — I2510 Atherosclerotic heart disease of native coronary artery without angina pectoris: Secondary | ICD-10-CM | POA: Diagnosis not present

## 2020-10-02 DIAGNOSIS — Z96651 Presence of right artificial knee joint: Secondary | ICD-10-CM | POA: Diagnosis not present

## 2020-10-02 DIAGNOSIS — S72001D Fracture of unspecified part of neck of right femur, subsequent encounter for closed fracture with routine healing: Secondary | ICD-10-CM | POA: Diagnosis not present

## 2020-10-02 DIAGNOSIS — E119 Type 2 diabetes mellitus without complications: Secondary | ICD-10-CM | POA: Diagnosis not present

## 2020-10-02 DIAGNOSIS — S22089D Unspecified fracture of T11-T12 vertebra, subsequent encounter for fracture with routine healing: Secondary | ICD-10-CM | POA: Diagnosis not present

## 2020-10-02 DIAGNOSIS — G40909 Epilepsy, unspecified, not intractable, without status epilepticus: Secondary | ICD-10-CM | POA: Diagnosis not present

## 2020-10-03 DIAGNOSIS — S22089D Unspecified fracture of T11-T12 vertebra, subsequent encounter for fracture with routine healing: Secondary | ICD-10-CM | POA: Diagnosis not present

## 2020-10-03 DIAGNOSIS — E119 Type 2 diabetes mellitus without complications: Secondary | ICD-10-CM | POA: Diagnosis not present

## 2020-10-03 DIAGNOSIS — S72001D Fracture of unspecified part of neck of right femur, subsequent encounter for closed fracture with routine healing: Secondary | ICD-10-CM | POA: Diagnosis not present

## 2020-10-03 DIAGNOSIS — Z96651 Presence of right artificial knee joint: Secondary | ICD-10-CM | POA: Diagnosis not present

## 2020-10-03 DIAGNOSIS — G40909 Epilepsy, unspecified, not intractable, without status epilepticus: Secondary | ICD-10-CM | POA: Diagnosis not present

## 2020-10-03 DIAGNOSIS — I2510 Atherosclerotic heart disease of native coronary artery without angina pectoris: Secondary | ICD-10-CM | POA: Diagnosis not present

## 2020-10-09 DIAGNOSIS — E119 Type 2 diabetes mellitus without complications: Secondary | ICD-10-CM | POA: Diagnosis not present

## 2020-10-09 DIAGNOSIS — I2510 Atherosclerotic heart disease of native coronary artery without angina pectoris: Secondary | ICD-10-CM | POA: Diagnosis not present

## 2020-10-09 DIAGNOSIS — Z96651 Presence of right artificial knee joint: Secondary | ICD-10-CM | POA: Diagnosis not present

## 2020-10-09 DIAGNOSIS — S72001D Fracture of unspecified part of neck of right femur, subsequent encounter for closed fracture with routine healing: Secondary | ICD-10-CM | POA: Diagnosis not present

## 2020-10-09 DIAGNOSIS — S22089D Unspecified fracture of T11-T12 vertebra, subsequent encounter for fracture with routine healing: Secondary | ICD-10-CM | POA: Diagnosis not present

## 2020-10-09 DIAGNOSIS — G40909 Epilepsy, unspecified, not intractable, without status epilepticus: Secondary | ICD-10-CM | POA: Diagnosis not present

## 2020-10-12 DIAGNOSIS — F32A Depression, unspecified: Secondary | ICD-10-CM | POA: Diagnosis not present

## 2020-10-12 DIAGNOSIS — E119 Type 2 diabetes mellitus without complications: Secondary | ICD-10-CM | POA: Diagnosis not present

## 2020-10-12 DIAGNOSIS — M47816 Spondylosis without myelopathy or radiculopathy, lumbar region: Secondary | ICD-10-CM | POA: Diagnosis not present

## 2020-10-12 DIAGNOSIS — D630 Anemia in neoplastic disease: Secondary | ICD-10-CM | POA: Diagnosis not present

## 2020-10-12 DIAGNOSIS — M8580 Other specified disorders of bone density and structure, unspecified site: Secondary | ICD-10-CM | POA: Diagnosis not present

## 2020-10-12 DIAGNOSIS — F419 Anxiety disorder, unspecified: Secondary | ICD-10-CM | POA: Diagnosis not present

## 2020-10-12 DIAGNOSIS — M48061 Spinal stenosis, lumbar region without neurogenic claudication: Secondary | ICD-10-CM | POA: Diagnosis not present

## 2020-10-12 DIAGNOSIS — I2510 Atherosclerotic heart disease of native coronary artery without angina pectoris: Secondary | ICD-10-CM | POA: Diagnosis not present

## 2020-10-12 DIAGNOSIS — R519 Headache, unspecified: Secondary | ICD-10-CM | POA: Diagnosis not present

## 2020-10-12 DIAGNOSIS — M810 Age-related osteoporosis without current pathological fracture: Secondary | ICD-10-CM | POA: Diagnosis not present

## 2020-10-12 DIAGNOSIS — M4312 Spondylolisthesis, cervical region: Secondary | ICD-10-CM | POA: Diagnosis not present

## 2020-10-12 DIAGNOSIS — M5137 Other intervertebral disc degeneration, lumbosacral region: Secondary | ICD-10-CM | POA: Diagnosis not present

## 2020-10-12 DIAGNOSIS — S72001D Fracture of unspecified part of neck of right femur, subsequent encounter for closed fracture with routine healing: Secondary | ICD-10-CM | POA: Diagnosis not present

## 2020-10-12 DIAGNOSIS — Z96651 Presence of right artificial knee joint: Secondary | ICD-10-CM | POA: Diagnosis not present

## 2020-10-12 DIAGNOSIS — M2041 Other hammer toe(s) (acquired), right foot: Secondary | ICD-10-CM | POA: Diagnosis not present

## 2020-10-12 DIAGNOSIS — M4050 Lordosis, unspecified, site unspecified: Secondary | ICD-10-CM | POA: Diagnosis not present

## 2020-10-12 DIAGNOSIS — M4802 Spinal stenosis, cervical region: Secondary | ICD-10-CM | POA: Diagnosis not present

## 2020-10-12 DIAGNOSIS — M47812 Spondylosis without myelopathy or radiculopathy, cervical region: Secondary | ICD-10-CM | POA: Diagnosis not present

## 2020-10-12 DIAGNOSIS — J45909 Unspecified asthma, uncomplicated: Secondary | ICD-10-CM | POA: Diagnosis not present

## 2020-10-12 DIAGNOSIS — M2042 Other hammer toe(s) (acquired), left foot: Secondary | ICD-10-CM | POA: Diagnosis not present

## 2020-10-12 DIAGNOSIS — G40909 Epilepsy, unspecified, not intractable, without status epilepticus: Secondary | ICD-10-CM | POA: Diagnosis not present

## 2020-10-12 DIAGNOSIS — M4316 Spondylolisthesis, lumbar region: Secondary | ICD-10-CM | POA: Diagnosis not present

## 2020-10-12 DIAGNOSIS — S22089D Unspecified fracture of T11-T12 vertebra, subsequent encounter for fracture with routine healing: Secondary | ICD-10-CM | POA: Diagnosis not present

## 2020-10-12 DIAGNOSIS — I10 Essential (primary) hypertension: Secondary | ICD-10-CM | POA: Diagnosis not present

## 2020-10-12 DIAGNOSIS — C50119 Malignant neoplasm of central portion of unspecified female breast: Secondary | ICD-10-CM | POA: Diagnosis not present

## 2020-10-16 DIAGNOSIS — S72001D Fracture of unspecified part of neck of right femur, subsequent encounter for closed fracture with routine healing: Secondary | ICD-10-CM | POA: Diagnosis not present

## 2020-10-16 DIAGNOSIS — S22089D Unspecified fracture of T11-T12 vertebra, subsequent encounter for fracture with routine healing: Secondary | ICD-10-CM | POA: Diagnosis not present

## 2020-10-16 DIAGNOSIS — G40909 Epilepsy, unspecified, not intractable, without status epilepticus: Secondary | ICD-10-CM | POA: Diagnosis not present

## 2020-10-16 DIAGNOSIS — I2510 Atherosclerotic heart disease of native coronary artery without angina pectoris: Secondary | ICD-10-CM | POA: Diagnosis not present

## 2020-10-16 DIAGNOSIS — Z96651 Presence of right artificial knee joint: Secondary | ICD-10-CM | POA: Diagnosis not present

## 2020-10-16 DIAGNOSIS — E119 Type 2 diabetes mellitus without complications: Secondary | ICD-10-CM | POA: Diagnosis not present

## 2020-10-18 ENCOUNTER — Other Ambulatory Visit: Payer: Self-pay | Admitting: *Deleted

## 2020-10-18 NOTE — Patient Outreach (Signed)
[facility]) Care Management  10/18/2020  Alexsia Klindt Karnik [birth year] [id number]     Case closure  RN spoke with pt today and inquired on her ongoing management of care.  Pt states she continues to due well with Blood glucose levels around 112 and last A1c was 7. States no acute events or issues as pt continue to have her caregiver throughout the day to transport her to all medical appointments.   Pt has changed provider and no longer eligible for [facility] services. Pt has no immediate needs and aware [facility] will no longer follow up and to contact her provider for any immediate needs.   Case Closure.  Raina Mina, RN Care Management Coordinator [place] Office [phone]

## 2020-10-19 DIAGNOSIS — M542 Cervicalgia: Secondary | ICD-10-CM | POA: Diagnosis not present

## 2020-10-21 DIAGNOSIS — G40909 Epilepsy, unspecified, not intractable, without status epilepticus: Secondary | ICD-10-CM | POA: Diagnosis not present

## 2020-10-21 DIAGNOSIS — Z96651 Presence of right artificial knee joint: Secondary | ICD-10-CM | POA: Diagnosis not present

## 2020-10-21 DIAGNOSIS — I2510 Atherosclerotic heart disease of native coronary artery without angina pectoris: Secondary | ICD-10-CM | POA: Diagnosis not present

## 2020-10-21 DIAGNOSIS — S72001D Fracture of unspecified part of neck of right femur, subsequent encounter for closed fracture with routine healing: Secondary | ICD-10-CM | POA: Diagnosis not present

## 2020-10-21 DIAGNOSIS — S22089D Unspecified fracture of T11-T12 vertebra, subsequent encounter for fracture with routine healing: Secondary | ICD-10-CM | POA: Diagnosis not present

## 2020-10-21 DIAGNOSIS — E119 Type 2 diabetes mellitus without complications: Secondary | ICD-10-CM | POA: Diagnosis not present

## 2020-10-24 DIAGNOSIS — I2510 Atherosclerotic heart disease of native coronary artery without angina pectoris: Secondary | ICD-10-CM | POA: Diagnosis not present

## 2020-10-24 DIAGNOSIS — Z96651 Presence of right artificial knee joint: Secondary | ICD-10-CM | POA: Diagnosis not present

## 2020-10-24 DIAGNOSIS — S72001D Fracture of unspecified part of neck of right femur, subsequent encounter for closed fracture with routine healing: Secondary | ICD-10-CM | POA: Diagnosis not present

## 2020-10-24 DIAGNOSIS — E119 Type 2 diabetes mellitus without complications: Secondary | ICD-10-CM | POA: Diagnosis not present

## 2020-10-24 DIAGNOSIS — S22089D Unspecified fracture of T11-T12 vertebra, subsequent encounter for fracture with routine healing: Secondary | ICD-10-CM | POA: Diagnosis not present

## 2020-10-24 DIAGNOSIS — G40909 Epilepsy, unspecified, not intractable, without status epilepticus: Secondary | ICD-10-CM | POA: Diagnosis not present

## 2020-10-25 DIAGNOSIS — M542 Cervicalgia: Secondary | ICD-10-CM | POA: Diagnosis not present

## 2020-11-05 DIAGNOSIS — E041 Nontoxic single thyroid nodule: Secondary | ICD-10-CM | POA: Diagnosis not present

## 2020-11-06 DIAGNOSIS — G40909 Epilepsy, unspecified, not intractable, without status epilepticus: Secondary | ICD-10-CM | POA: Diagnosis not present

## 2020-11-06 DIAGNOSIS — Z96651 Presence of right artificial knee joint: Secondary | ICD-10-CM | POA: Diagnosis not present

## 2020-11-06 DIAGNOSIS — S22089D Unspecified fracture of T11-T12 vertebra, subsequent encounter for fracture with routine healing: Secondary | ICD-10-CM | POA: Diagnosis not present

## 2020-11-06 DIAGNOSIS — E119 Type 2 diabetes mellitus without complications: Secondary | ICD-10-CM | POA: Diagnosis not present

## 2020-11-06 DIAGNOSIS — I2510 Atherosclerotic heart disease of native coronary artery without angina pectoris: Secondary | ICD-10-CM | POA: Diagnosis not present

## 2020-11-06 DIAGNOSIS — S72001D Fracture of unspecified part of neck of right femur, subsequent encounter for closed fracture with routine healing: Secondary | ICD-10-CM | POA: Diagnosis not present

## 2020-11-10 DIAGNOSIS — Z20822 Contact with and (suspected) exposure to covid-19: Secondary | ICD-10-CM | POA: Diagnosis not present

## 2020-11-15 ENCOUNTER — Other Ambulatory Visit: Payer: Self-pay

## 2020-11-15 ENCOUNTER — Ambulatory Visit (INDEPENDENT_AMBULATORY_CARE_PROVIDER_SITE_OTHER): Payer: Medicare Other | Admitting: Podiatry

## 2020-11-15 ENCOUNTER — Encounter: Payer: Self-pay | Admitting: Podiatry

## 2020-11-15 DIAGNOSIS — M79674 Pain in right toe(s): Secondary | ICD-10-CM | POA: Diagnosis not present

## 2020-11-15 DIAGNOSIS — L84 Corns and callosities: Secondary | ICD-10-CM | POA: Diagnosis not present

## 2020-11-15 DIAGNOSIS — B351 Tinea unguium: Secondary | ICD-10-CM

## 2020-11-15 DIAGNOSIS — E1142 Type 2 diabetes mellitus with diabetic polyneuropathy: Secondary | ICD-10-CM

## 2020-11-15 DIAGNOSIS — M79675 Pain in left toe(s): Secondary | ICD-10-CM

## 2020-11-17 NOTE — Progress Notes (Signed)
Subjective:  Patient ID: [name], female    DOB: 07/17/1929,  MRN: [id number]  87 y.o. female presents with at risk foot care with history of diabetic neuropathy and corn(s) left 2nd toe , callus(es) b/l feet and painful mycotic nails.  Pain interferes with ambulation. Aggravating factors include wearing enclosed shoe gear. Painful toenails interfere with ambulation. Aggravating factors include wearing enclosed shoe gear. Pain is relieved with periodic professional debridement. Painful corns and calluses are aggravated when weightbearing with and without shoegear. Pain is relieved with periodic professional debridement.  Her sister, [name], is present during today's visit. Ms. Fortunato states her calluses are bothering her on today. Denies any redness, drainage or swelling.  Patient's blood sugar was 107 mg/dl today.  PCP: Bartholome Bill, MD and last visit was: 11/05/2020  Review of Systems: Negative except as noted in the HPI.   Allergies  Allergen Reactions   Bystolic [Nebivolol Hcl] Other (See Comments)    "extreme weakness, heaviness in legs & arms, swelling in legs/arms/face, swollen abdomen, pain in bladder, feet pain, soreness in chest"   Carbamazepine Other (See Comments)    Blood poisoning  Other reaction(s): Unknown   Cholestyramine Other (See Comments)    "itching rash on stomach, bloated, nausea, vomiting, sleeplessness, extreme pain in arms"   Hydrazine Yellow [Tartrazine] Other (See Comments)    "does not reduce high blood pressure, pain in arm, high pressure, felt like I was on verge of heart attack, really weak"   Morphine Other (See Comments)    Feels morbid, weak, still in pain   Niacin Palpitations    Fast heart beat Other reaction(s): Unknown   Niaspan [Niacin Er] Palpitations and Other (See Comments)    "fast heart beat, high blood pressure"   Norvasc [Amlodipine Besylate] Other (See Comments)    "extreme fluid retention/pain)   Optivar [Azelastine Hcl]  Photosensitivity   Repaglinide Hives   Sular [Nisoldipine Er] Other (See Comments)    "severe headaches, swelling eyes, hands, feet, shortness of breath, weak, flushed face, brain boiling, fluid retention, high blood sugar, nervous, heart fast beating"   Telmisartan Other (See Comments)    "headache, difficulty urinating, high blood sugar, fluid retention"   Amoxicillin-Pot Clavulanate Rash and Other (See Comments)   Cefdinir Swelling    Vaginal irritation, breathing,    Ciprofloxacin Hcl Hives   Clonidine Other (See Comments)    Dry mouth, fluid retention   Clonidine Hydrochloride Other (See Comments)    Dry mouth, fluid retention   Codeine Nausea And Vomiting   Ezetimibe Other (See Comments)    Made weak Other reaction(s): Unknown   Hydroxychloroquine Other (See Comments)    Low platlets   Naproxen Other (See Comments)    Shrinks bladder   Sulfa Antibiotics Rash   Ziac [Bisoprolol-Hydrochlorothiazide] Other (See Comments)    "stopped urination"   Amlodipine Besy-Benazepril Hcl     Other reaction(s): cough   Azelastine Other (See Comments)    Unknown reaction - Per MAR   Elavil [Amitriptyline] Other (See Comments)    Gave Pt nightmares   Empagliflozin Other (See Comments)    "Caused yeast infection, slowed my urine"   Hydralazine Other (See Comments)    Unknown reaction - Per MAR   Iodine I 131 Tositumomab Other (See Comments)    Unknown reaction - Per MAR   Kenalog [Triamcinolone] Diarrhea    Per MAR   Keppra [Levetiracetam] Other (See Comments)    Shaking   Lamotrigine Itching and  Other (See Comments)   Norvasc [Amlodipine] Other (See Comments)    Unknown reaction - Per MAR   Other Other (See Comments)    Other reaction(s): Unknown Other reaction(s): Unknown Other reaction(s): Unknown Other reaction(s): Unknown Other reaction(s): Unknown   Pregabalin Swelling    Other reaction(s): weight gain   Pseudoephedrine    Pseudoephedrine-Guaifenesin Er Other (See  Comments)    Unknown reaction - Per MAR   Risedronate     Unknown reaction - Per MAR   Ru-Hist D [Brompheniramine-Phenylephrine] Other (See Comments)    Unknown reaction - Per MAR   Sumatriptan Other (See Comments)   Topiramate Other (See Comments)    Dry eyes Other reaction(s): Unknown   Ace Inhibitors Other (See Comments)    unknown   Actonel [Risedronate Sodium] Other (See Comments)    unknown   Amlodipine Besylate Other (See Comments)    unknown   Aspirin Other (See Comments)    unknown   Atacand [Candesartan] Other (See Comments)    Unknown reaction - MAR   Bextra [Valdecoxib] Other (See Comments)    Unknown reaction - Per MAR   Bisoprolol-Hydrochlorothiazide Other (See Comments)    Unknown reaction - Per MAR   Candesartan Cilexetil Other (See Comments)    unknown   Cefadroxil Other (See Comments)    unknown   Celecoxib Rash   Hydrocodone Other (See Comments)    unknown   Hydrocodone-Acetaminophen Other (See Comments)    unknown   Iodinated Diagnostic Agents Rash    "All over"  Other reaction(s): Unknown   Meloxicam Other (See Comments)    unknown   Methylprednisolone Sodium Succinate Other (See Comments)    unknown   Nabumetone Other (See Comments)    Unknown reaction   Penicillins Other (See Comments)    unknown   Pseudoephedrine-Guaifenesin Other (See Comments)    unknown   Risedronate Sodium Other (See Comments)    unknown   Rofecoxib Other (See Comments)    Unknown reaction - MAR Other reaction(s): Unknown   Ru-Tuss [Chlorphen-Pse-Atrop-Hyos-Scop] Other (See Comments)    unknown   Sulfonamide Derivatives Other (See Comments)    unknown   Sulphur [Elemental Sulfur] Other (See Comments)    unknown   Telithromycin Other (See Comments)    unknown   Terfenadine Other (See Comments)    Unknown reaction - Per MAR   Trandolapril-Verapamil Hcl Er Other (See Comments)    Headache, difficulty urinating, high blood sugar, fluid retention  Pt is taking  Tarka (trandolapril-verapamil) currently, but requests the medication stay in her allergy list   Trandolapril-Verapamil Hcl Er Other (See Comments)    Headache, difficulty urinating, high blood sugar, fluid retention  Pt is taking Tarka (trandolapril-verapamil) currently, but requests the medication stay in her allergy list   Valium [Diazepam] Other (See Comments)    Makes her mean and hyper    Objective:  There were no vitals filed for this visit. Constitutional Patient is a pleasant 87 y.o. Caucasian female WD, WN in NAD. AAO x 3.  Vascular Capillary fill time to digits <3 seconds b/l lower extremities. Faintly palpable DP pulse(s) b/l lower extremities. Faintly palpable PT pulse(s) b/l lower extremities. Pedal hair absent. Lower extremity skin temperature gradient within normal limits. No pain with calf compression b/l. No edema noted b/l lower extremities. No cyanosis or clubbing noted.  Neurologic Normal speech. Protective sensation diminished with 10g monofilament b/l.  Dermatologic Pedal skin with normal turgor, texture and tone b/l lower extremities. No  open wounds b/l lower extremities. No interdigital macerations b/l lower extremities. Toenails 1-5 b/l elongated, discolored, dystrophic, thickened, crumbly with subungual debris and tenderness to dorsal palpation. Hyperkeratotic lesion(s) L 2nd toe, submet head 1 left foot, and submet head 1 right foot.  No erythema, no edema, no drainage, no fluctuance.  Orthopedic: Normal muscle strength 5/5 to all lower extremity muscle groups bilaterally. No pain crepitus or joint limitation noted with ROM b/l. Hammertoe(s) noted to the 1-5 bilaterally.   Hemoglobin A1C Latest Ref Rng & Units 06/04/2020 03/04/2020 01/01/2020  HGBA1C 4.8 - 5.6 % 6.4(H) 8.6(H) 7.3(H)  Some recent data might be hidden   Assessment:   1. Pain due to onychomycosis of toenails of both feet   2. Corns and callosities   3. Diabetic peripheral neuropathy associated with type  2 diabetes mellitus ([place])    Plan:  Patient was evaluated and treated and all questions answered.  Onychomycosis with pain -Nails palliatively debridement as below. -Educated on self-care  Procedure: Nail Debridement Rationale: Pain Type of Debridement: manual, sharp debridement. Instrumentation: Nail nipper, rotary burr. Number of Nails: 10  -Examined patient. -Continue diabetic foot care principles. -Patient to continue soft, supportive shoe gear daily. -Toenails 1-5 b/l were debrided in length and girth with sterile nail nippers and dremel without iatrogenic bleeding.  -Corn(s) L 2nd toe and callus(es) submet head 1 left foot and submet head 1 right foot were pared utilizing sterile scalpel blade without incident. Total number debrided =3. -Patient to report any pedal injuries to medical professional immediately. -Patient/POA to call should there be question/concern in the interim.  Return in about 9 weeks (around 01/17/2021).  Marzetta Board, DPM

## 2020-11-19 ENCOUNTER — Ambulatory Visit (INDEPENDENT_AMBULATORY_CARE_PROVIDER_SITE_OTHER): Payer: Medicare Other

## 2020-11-19 ENCOUNTER — Other Ambulatory Visit: Payer: Self-pay

## 2020-11-19 DIAGNOSIS — L97501 Non-pressure chronic ulcer of other part of unspecified foot limited to breakdown of skin: Secondary | ICD-10-CM

## 2020-11-19 DIAGNOSIS — E08621 Diabetes mellitus due to underlying condition with foot ulcer: Secondary | ICD-10-CM

## 2020-11-19 NOTE — Progress Notes (Signed)
Patient in office today for diabetic shoe selection and custom inserts. Patient wears a size 9 women's regular width shoe. The doctor treating patient's diabetes is Dr. Denton Lank. Patient selected shoe [id number] Apex Mesh Karle Starch as her 1st option and shoe [address] [employer] as a 2nd option. Patient advised that the office will call when shoes and inserts are available for pick-up. Patient verbalized understanding.

## 2020-12-02 ENCOUNTER — Ambulatory Visit: Payer: Medicare Other | Admitting: Neurology

## 2020-12-02 ENCOUNTER — Ambulatory Visit: Payer: Medicare Other | Admitting: Podiatry

## 2020-12-02 DIAGNOSIS — E041 Nontoxic single thyroid nodule: Secondary | ICD-10-CM | POA: Diagnosis not present

## 2020-12-02 DIAGNOSIS — Z794 Long term (current) use of insulin: Secondary | ICD-10-CM | POA: Diagnosis not present

## 2020-12-02 DIAGNOSIS — E559 Vitamin D deficiency, unspecified: Secondary | ICD-10-CM | POA: Diagnosis not present

## 2020-12-02 DIAGNOSIS — E039 Hypothyroidism, unspecified: Secondary | ICD-10-CM | POA: Diagnosis not present

## 2020-12-02 DIAGNOSIS — M8000XS Age-related osteoporosis with current pathological fracture, unspecified site, sequela: Secondary | ICD-10-CM | POA: Diagnosis not present

## 2020-12-02 DIAGNOSIS — E1165 Type 2 diabetes mellitus with hyperglycemia: Secondary | ICD-10-CM | POA: Diagnosis not present

## 2020-12-03 DIAGNOSIS — M5030 Other cervical disc degeneration, unspecified cervical region: Secondary | ICD-10-CM | POA: Insufficient documentation

## 2020-12-07 ENCOUNTER — Other Ambulatory Visit: Payer: Self-pay | Admitting: Neurology

## 2020-12-16 ENCOUNTER — Ambulatory Visit: Payer: Medicare Other | Admitting: Neurology

## 2020-12-20 ENCOUNTER — Telehealth: Payer: Self-pay | Admitting: Podiatry

## 2020-12-20 NOTE — Telephone Encounter (Signed)
Diabetic shoes/inserts in..lvm for pt to call to schedule an appt to pick them up.. next available date is 8.26 next Friday.

## 2020-12-24 ENCOUNTER — Encounter: Payer: Self-pay | Admitting: Neurology

## 2020-12-24 ENCOUNTER — Ambulatory Visit (INDEPENDENT_AMBULATORY_CARE_PROVIDER_SITE_OTHER): Payer: Medicare Other | Admitting: Neurology

## 2020-12-24 VITALS — BP 122/64 | HR 72 | Ht 67.5 in | Wt 143.8 lb

## 2020-12-24 DIAGNOSIS — G40909 Epilepsy, unspecified, not intractable, without status epilepticus: Secondary | ICD-10-CM | POA: Diagnosis not present

## 2020-12-24 DIAGNOSIS — R269 Unspecified abnormalities of gait and mobility: Secondary | ICD-10-CM | POA: Diagnosis not present

## 2020-12-24 DIAGNOSIS — Z5181 Encounter for therapeutic drug level monitoring: Secondary | ICD-10-CM | POA: Diagnosis not present

## 2020-12-24 DIAGNOSIS — G250 Essential tremor: Secondary | ICD-10-CM | POA: Diagnosis not present

## 2020-12-24 NOTE — Progress Notes (Signed)
Reason for visit: Essential tremor, history of seizures, gait disorder  [name] is an 87 y.o. female  History of present illness:  [name] is an 87 year old right-handed white female with a history of a seizure disorder, her last seizure was on 19 Rison 2022.  The patient was in an extended care facility at that time and she is not completely sure that she was getting her medications properly.  She has a chronic gait disorder, she has fallen on occasion, she has not fallen recently.  She uses a walker to get around.  She has an essential tremor that affects her ability to feed herself at times.  She is on Mysoline taking 150 mg in the morning and 100 mg in the evening.  She has history of diabetes.  She continues to be concerned about the tremor.  She wishes to have something done about this issue.  She questions whether ultrasound ablation of the brain [place] be something she could consider.  Past Medical History:  Diagnosis Date   Anemia    Anxiety    Asthma    Breast cancer ([facility] [facility]) 2014   right breast   Cancer of right breast ([facility]) 12/26/12   right breast 12:00 o'clock, DCIS   Carotid artery disease (HCC)    Carpal tunnel syndrome, bilateral    Chronic bronchitis (HCC)    Chronic cough    Chronic facial pain    Chronic foot pain    Colon cancer ([place]) dx'd 10/6292   Complication of anesthesia    Sore jaw; could not chew or move mouth, prolonged sedation   Convulsions/seizures ([facility]) 10/16/2014   Diabetes mellitus    type 2 niddm x 20 years   Dyslipidemia    Ejection fraction    Gait abnormality 12/04/2019   GERD (gastroesophageal reflux disease)    Hammer toe    bilateral   History of colonic polyps    History of meningioma    HTN (hypertension)    Hx of radiation therapy 03/07/13- 03/29/13   right breast 4250 cGy 17 sessions   Hyperlipidemia    Hypokalemia    Hyponatremia    Hypothyroidism    IBS (irritable bowel syndrome)    Melanoma ([place])    Metatarsal bone fracture 2014    Multiple drug allergies    Nontoxic thyroid nodule    Obesity    Osteoarthritis    Osteoporosis    Palpitations    Personal history of radiation therapy 2014   Seasonal allergies    Skin cancer    Syncope    Tremor, essential 08/18/2016   Vitamin B12 deficiency    Vitamin D deficiency     Past Surgical History:  Procedure Laterality Date   ABDOMINAL HYSTERECTOMY     BIOPSY  11/07/2019   Procedure: BIOPSY;  Surgeon: Ronald Lobo, MD;  Location: [facility];  Service: Endoscopy;;   BRAIN SURGERY     BREAST BIOPSY Right 01/24/2013   Procedure: RE-EXCICION OF BREAST CANCER, ANTERIOR MARGINS;  Surgeon: Edward Jolly, MD;  Location: [facility];  Service: General;  Laterality: Right;   BREAST LUMPECTOMY Right 2014   BREAST LUMPECTOMY WITH NEEDLE LOCALIZATION Right 01/17/2013   Procedure: BREAST LUMPECTOMY WITH NEEDLE LOCALIZATION;  Surgeon: Edward Jolly, MD;  Location: [facility];  Service: General;  Laterality: Right;   BUNIONECTOMY Bilateral    CATARACT EXTRACTION W/ INTRAOCULAR LENS IMPLANT Right    CHOLECYSTECTOMY     COLONOSCOPY  COLONOSCOPY WITH PROPOFOL N/A 11/07/2019   Procedure: COLONOSCOPY WITH PROPOFOL;  Surgeon: Ronald Lobo, MD;  [facility];  Service: Endoscopy;  Laterality: N/A;   CRANIOTOMY Right 10/18/2013   Procedure: CRANIOTOMY TUMOR EXCISION;  Surgeon: Floyce Stakes, MD;  [facility];  Service: Neurosurgery;  Laterality: Right;   ESOPHAGOGASTRODUODENOSCOPY (EGD) WITH PROPOFOL N/A 11/07/2019   Procedure: ESOPHAGOGASTRODUODENOSCOPY (EGD) WITH PROPOFOL;  Surgeon: Ronald Lobo, MD;  [facility];  Service: Endoscopy;  Laterality: N/A;   EYE SURGERY     HERNIA REPAIR     IR THORACENTESIS ASP PLEURAL SPACE W/IMG GUIDE  04/01/2020   KNEE ARTHROSCOPY Bilateral    LAPAROSCOPIC RIGHT HEMI COLECTOMY Right 03/06/2020   Procedure: LAPAROSCOPIC RIGHT HEMI COLECTOMY WITH TAP BLOCK AND LYSIS OF ADHESIONS;  Surgeon: Ileana Roup, MD;  Location: [facility];  Service: General;  Laterality: Right;   POLYPECTOMY     small adenomatous   TOTAL HIP ARTHROPLASTY Right 06/05/2020   Procedure: ARTHROPLASTY  ANTERIOR APPROACH. RIGHT HIP;  Surgeon: Rod Can, MD;  Location: [facility];  Service: Orthopedics;  Laterality: Right;   ULNAR TUNNEL RELEASE      Family History  Problem Relation Age of Onset   Heart disease Mother    Osteoporosis Mother    Diabetes Father    Pancreatic cancer Father    Bone cancer Sister    Rectal cancer Sister    Thyroid disease Sister        benign goiter resected   Prostate cancer Brother    Colon cancer Brother    Colon cancer Other     Social history:  reports that she has never smoked. She has never used smokeless tobacco. She reports that she does not drink alcohol and does not use drugs.    Allergies  Allergen Reactions   Bystolic [Nebivolol Hcl] Other (See Comments)    "extreme weakness, heaviness in legs & arms, swelling in legs/arms/face, swollen abdomen, pain in bladder, feet pain, soreness in chest"   Carbamazepine Other (See Comments)    Blood poisoning  Other reaction(s): Unknown   Cholestyramine Other (See Comments)    "itching rash on stomach, bloated, nausea, vomiting, sleeplessness, extreme pain in arms"   Hydrazine Yellow [Tartrazine] Other (See Comments)    "does not reduce high blood pressure, pain in arm, high pressure, felt like I was on verge of heart attack, really weak"   Morphine Other (See Comments)    Feels morbid, weak, still in pain   Niacin Palpitations    Fast heart beat Other reaction(s): Unknown   Niaspan [Niacin Er] Palpitations and Other (See Comments)    "fast heart beat, high blood pressure"   Norvasc [Amlodipine Besylate] Other (See Comments)    "extreme fluid retention/pain)   Optivar [Azelastine Hcl] Photosensitivity   Repaglinide Hives   Sular [Nisoldipine Er] Other (See Comments)    "severe headaches, swelling eyes, hands, feet,  shortness of breath, weak, flushed face, brain boiling, fluid retention, high blood sugar, nervous, heart fast beating"   Telmisartan Other (See Comments)    "headache, difficulty urinating, high blood sugar, fluid retention"   Amoxicillin-Pot Clavulanate Rash and Other (See Comments)   Cefdinir Swelling    Vaginal irritation, breathing,    Ciprofloxacin Hcl Hives   Clonidine Other (See Comments)    Dry mouth, fluid retention   Clonidine Hydrochloride Other (See Comments)    Dry mouth, fluid retention   Codeine Nausea And Vomiting   Ezetimibe Other (  See Comments)    Made weak Other reaction(s): Unknown   Hydroxychloroquine Other (See Comments)    Low platlets   Naproxen Other (See Comments)    Shrinks bladder   Sulfa Antibiotics Rash   Ziac [Bisoprolol-Hydrochlorothiazide] Other (See Comments)    "stopped urination"   Amlodipine Besy-Benazepril Hcl     Other reaction(s): cough   Azelastine Other (See Comments)    Unknown reaction - Per MAR   Elavil [Amitriptyline] Other (See Comments)    Gave Pt nightmares   Empagliflozin Other (See Comments)    "Caused yeast infection, slowed my urine"   Hydralazine Other (See Comments)    Unknown reaction - Per MAR   Iodine I 131 Tositumomab Other (See Comments)    Unknown reaction - Per MAR   Kenalog [Triamcinolone] Diarrhea    Per MAR   Keppra [Levetiracetam] Other (See Comments)    Shaking   Lamotrigine Itching and Other (See Comments)   Norvasc [Amlodipine] Other (See Comments)    Unknown reaction - Per MAR   Other Other (See Comments)    Other reaction(s): Unknown Other reaction(s): Unknown Other reaction(s): Unknown Other reaction(s): Unknown Other reaction(s): Unknown   Pregabalin Swelling    Other reaction(s): weight gain   Pseudoephedrine    Pseudoephedrine-Guaifenesin Er Other (See Comments)    Unknown reaction - Per MAR   Risedronate     Unknown reaction - Per MAR   Ru-Hist D [Brompheniramine-Phenylephrine] Other  (See Comments)    Unknown reaction - Per MAR   Sumatriptan Other (See Comments)   Topiramate Other (See Comments)    Dry eyes Other reaction(s): Unknown   Ace Inhibitors Other (See Comments)    unknown   Actonel [Risedronate Sodium] Other (See Comments)    unknown   Amlodipine Besylate Other (See Comments)    unknown   Aspirin Other (See Comments)    unknown   Atacand [Candesartan] Other (See Comments)    Unknown reaction - MAR   Bextra [Valdecoxib] Other (See Comments)    Unknown reaction - Per MAR   Bisoprolol-Hydrochlorothiazide Other (See Comments)    Unknown reaction - Per MAR   Candesartan Cilexetil Other (See Comments)    unknown   Cefadroxil Other (See Comments)    unknown   Celecoxib Rash   Hydrocodone Other (See Comments)    unknown   Hydrocodone-Acetaminophen Other (See Comments)    unknown   Iodinated Diagnostic Agents Rash    "All over"  Other reaction(s): Unknown   Meloxicam Other (See Comments)    unknown   Methylprednisolone Sodium Succinate Other (See Comments)    unknown   Nabumetone Other (See Comments)    Unknown reaction   Penicillins Other (See Comments)    unknown   Pseudoephedrine-Guaifenesin Other (See Comments)    unknown   Risedronate Sodium Other (See Comments)    unknown   Rofecoxib Other (See Comments)    Unknown reaction - MAR Other reaction(s): Unknown   Ru-Tuss [Chlorphen-Pse-Atrop-Hyos-Scop] Other (See Comments)    unknown   Sulfonamide Derivatives Other (See Comments)    unknown   Sulphur [Elemental Sulfur] Other (See Comments)    unknown   Telithromycin Other (See Comments)    unknown   Terfenadine Other (See Comments)    Unknown reaction - Per MAR   Trandolapril-Verapamil Hcl Er Other (See Comments)    Headache, difficulty urinating, high blood sugar, fluid retention  Pt is taking Tarka (trandolapril-verapamil) currently, but requests the medication stay in her allergy  list   Trandolapril-Verapamil Hcl Er Other (See  Comments)    Headache, difficulty urinating, high blood sugar, fluid retention  Pt is taking Tarka (trandolapril-verapamil) currently, but requests the medication stay in her allergy list   Valium [Diazepam] Other (See Comments)    Makes her mean and hyper    Medications:  Prior to Admission medications   Medication Sig Start Date End Date Taking? Authorizing Provider  acetaminophen (TYLENOL) 500 MG tablet Take 2 tablets (1,000 mg total) by mouth 3 (three) times daily. 08/21/20  Yes Vann, Jessica U, DO  acidophilus (RISAQUAD) CAPS capsule Take 1 capsule by mouth daily. 08/21/20  Yes Geradine Girt, DO  ALPRAZolam (XANAX) 0.25 MG tablet Take 1 tablet (0.25 mg total) by mouth 3 (three) times daily as needed for anxiety. 09/20/20  Yes Kathrynn Ducking, MD  benzonatate (TESSALON) 200 MG capsule Take 1 capsule (200 mg total) by mouth 2 (two) times daily as needed for cough. 08/21/20  Yes Vann, Jessica U, DO  Blood Glucose Monitoring Suppl (PRODIGY VOICE BLOOD GLUCOSE) w/Device KIT Use to check blood sugar 1 time per day. 10/18/15  Yes Renato Shin, MD  Cholecalciferol (VITAMIN D-3) 125 MCG (5000 UT) TABS Take 5,000 Units by mouth daily.   Yes [provider]  cholestyramine (QUESTRAN) 4 g packet Take 4 g by mouth 2 (two) times daily.   Yes [provider]  cholestyramine light (PREVALITE) 4 GM/DOSE powder Cholestyramine Light 4 gram powder for susp in a packet  MIX 1 PACKET WITH WATER OR NON CARBONATED DRINK AND DRINK BY MOUTH TWICE DAILY AT LEAST 2 HOURS AWAY FROM OTHER MEDICINES   Yes [provider]  CHOLESTYRAMINE PO  08/22/20  Yes [provider]  diclofenac Sodium (VOLTAREN) 1 % GEL Apply 2 g topically 4 (four) times daily as needed (pain). knees 06/01/19  Yes [provider]  escitalopram (LEXAPRO) 10 MG tablet Take 1 tablet (10 mg total) by mouth daily. 07/08/20  Yes Fargo, Amy E, NP  feeding supplement, GLUCERNA SHAKE, (GLUCERNA SHAKE) LIQD Take 237  mLs by mouth daily.   Yes [provider]  glucose blood (GLUCOSE METER TEST) test strip See admin instructions.   Yes [provider]  Glucose Blood (PRODIGY VOICE BLOOD GLUCOSE VI)    Yes [provider]  insulin aspart (NOVOLOG) 100 UNIT/ML FlexPen Inject 2-12 Units into the skin See admin instructions. Injects 2-10 units of insulin under the skin 3 times a day per sliding scale: CBG 0-150: 2 units; CBG 151-200: 4 units; CBG 201-250: 6 units; CBG 251-300: 8 units; CBG 301-350: 10 units; CBG 351-400: 12 units; CBG >400: 12 units and notify the MD/NP 07/08/20  Yes Fargo, Amy E, NP  insulin glargine (LANTUS SOLOSTAR) 100 UNIT/ML Solostar Pen Inject 10 Units into the skin at bedtime. Patient taking differently: Inject 14 Units into the skin at bedtime. 08/21/20  Yes Geradine Girt, DO  Insulin Pen Needle 32G X 4 MM MISC Used to inject insulin 3x daily 11/19/16  Yes Renato Shin, MD  levothyroxine (SYNTHROID) 75 MCG tablet Take 1 tablet (75 mcg total) by mouth daily before breakfast. 07/08/20  Yes Fargo, Amy E, NP  lidocaine (LIDODERM) 5 % Place 1 patch onto the skin daily. Remove & Discard patch within 12 hours or as directed by MD 07/08/20  Yes Fargo, Amy E, NP  lidocaine (XYLOCAINE) 2 % solution  10/20/20  Yes [provider]  ondansetron (ZOFRAN-ODT) 4 MG disintegrating tablet Take  1 tablet (4 mg total) by mouth every 6 (six) hours as needed for nausea. 03/19/20  Yes Ileana Roup, MD  oxyCODONE (ROXICODONE) 5 MG immediate release tablet Take 0.5 tablets (2.5 mg total) by mouth every 6 (six) hours as needed for severe pain. 08/22/20  Yes Vann, Jessica U, DO  pantoprazole (PROTONIX) 40 MG tablet Take 1 tablet (40 mg total) by mouth daily at 6 (six) AM. 07/08/20  Yes Fargo, Amy E, NP  phenytoin (DILANTIN) 100 MG ER capsule Take 1 capsule (100 mg total) by mouth daily. 08/22/20  Yes Geradine Girt, DO  phenytoin (DILANTIN) 200 MG ER capsule Take 1 capsule (200 mg total)  by mouth at bedtime. 08/21/20  Yes Vann, Jessica U, DO  PHENYTOIN INFATABS 50 MG tablet CHEW 1 TABLET DAILY 12/09/20  Yes Kathrynn Ducking, MD  Polyethyl Glycol-Propyl Glycol (SYSTANE) 0.4-0.3 % SOLN Place 2 drops into both eyes every 12 (twelve) hours as needed (dry eyes).   Yes [provider]  primidone (MYSOLINE) 50 MG tablet Take 2 tablets (100 mg total) by mouth in the morning and at bedtime. 08/07/20  Yes Kathrynn Ducking, MD  thiamine 250 MG tablet 250 mg.   Yes [provider]  tiZANidine (ZANAFLEX) 2 MG tablet  09/21/20  Yes [provider]  trandolapril ([place]) 2 MG tablet  10/19/20  Yes [provider]  Verapamil HCl CR 200 MG CP24  10/19/20  Yes [provider]  trandolapril-verapamil (TARKA) 2-240 MG tablet Take 1 tablet by mouth at bedtime. Patient not taking: Reported on 12/24/2020 07/08/20   Yvonna Alanis, NP    ROS:  Out of a complete 14 system review of symptoms, the patient complains only of the following symptoms, and all other reviewed systems are negative.  Tremor Walking difficulty  Blood pressure 122/64, pulse 72, height 5' 7.5" (1.715 m), weight 143 lb 12.8 oz (65.2 kg).  Physical Exam  General: The patient is alert and cooperative at the time of the examination.  Skin: No significant peripheral edema is noted.   Neurologic Exam  Mental status: The patient is alert and oriented x 3 at the time of the examination. The patient has apparent normal recent and remote memory, with an apparently normal attention span and concentration ability.   Cranial nerves: Facial symmetry is present. Speech is normal, no aphasia or dysarthria is noted. Extraocular movements are full. Visual fields are full.  Motor: The patient has good strength in all 4 extremities.  Sensory examination: Soft touch sensation is symmetric on the face, arms, and legs.  Coordination: The patient has good finger-nose-finger and heel-to-shin bilaterally.   Tremor is noted with finger-nose-finger bilaterally.  Gait and station: The patient has a slightly wide-based gait, the patient walks with a walker.  Tandem gait was not attempted.  Romberg is unsteady with a tendency to fall.  Reflexes: Deep tendon reflexes are symmetric.   Assessment/Plan:  1.  Essential tremor  2.  Gait disorder  3.  History of seizures  The patient currently is on Dilantin and Mysoline.  I will check blood levels today, if the primidone and phenobarbital levels appear to be adequate, I [place] consider cutting back on the Dilantin.  We [place] go up on the Mysoline to 150 mg twice daily.  The patient does report some mild drowsiness during the day, however.  Reduction in the Dilantin dose [place] help this.  She will follow-up in 6 months, in the future she can  be followed through Dr. Leta Baptist.  Jill Alexanders MD 12/24/2020 11:08 AM  [facility] [address] [place], [place] [address]  Phone [phone] Fax [phone]

## 2020-12-26 ENCOUNTER — Telehealth: Payer: Self-pay | Admitting: Neurology

## 2020-12-26 LAB — PRIMIDONE, SERUM
Phenobarbital, Serum: 20 ug/mL (ref 15–40)
Primidone Lvl: 6 ug/mL (ref 5.0–12.0)

## 2020-12-26 LAB — PHENOBARBITAL LEVEL: Phenobarbital, Serum: 19 ug/mL (ref 15–40)

## 2020-12-26 LAB — CBC WITH DIFFERENTIAL/PLATELET
Basophils Absolute: 0 10*3/uL (ref 0.0–0.2)
Basos: 0 %
EOS (ABSOLUTE): 0.1 10*3/uL (ref 0.0–0.4)
Eos: 1 %
Hematocrit: 34.3 % (ref 34.0–46.6)
Hemoglobin: 11.5 g/dL (ref 11.1–15.9)
Immature Grans (Abs): 0 10*3/uL (ref 0.0–0.1)
Immature Granulocytes: 0 %
Lymphocytes Absolute: 1.8 10*3/uL (ref 0.7–3.1)
Lymphs: 19 %
MCH: 30.3 pg (ref 26.6–33.0)
MCHC: 33.5 g/dL (ref 31.5–35.7)
MCV: 91 fL (ref 79–97)
Monocytes Absolute: 0.6 10*3/uL (ref 0.1–0.9)
Monocytes: 7 %
Neutrophils Absolute: 6.7 10*3/uL (ref 1.4–7.0)
Neutrophils: 73 %
Platelets: 291 10*3/uL (ref 150–450)
RBC: 3.79 x10E6/uL (ref 3.77–5.28)
RDW: 12.1 % (ref 11.7–15.4)
WBC: 9.2 10*3/uL (ref 3.4–10.8)

## 2020-12-26 LAB — COMPREHENSIVE METABOLIC PANEL
ALT: 21 IU/L (ref 0–32)
AST: 17 IU/L (ref 0–40)
Albumin/Globulin Ratio: 2 (ref 1.2–2.2)
Albumin: 4.2 g/dL (ref 3.6–4.6)
Alkaline Phosphatase: 96 IU/L (ref 44–121)
BUN/Creatinine Ratio: 17 (ref 12–28)
BUN: 14 mg/dL (ref 8–27)
Bilirubin Total: <0.2 mg/dL (ref 0.0–1.2)
CO2: 19 mmol/L — ABNORMAL LOW (ref 20–29)
Calcium: 8.9 mg/dL (ref 8.7–10.3)
Chloride: 97 mmol/L (ref 96–106)
Creatinine, Ser: 0.82 mg/dL (ref 0.57–1.00)
Globulin, Total: 2.1 g/dL (ref 1.5–4.5)
Glucose: 171 mg/dL — ABNORMAL HIGH (ref 65–99)
Potassium: 5.3 mmol/L — ABNORMAL HIGH (ref 3.5–5.2)
Sodium: 130 mmol/L — ABNORMAL LOW (ref 134–144)
Total Protein: 6.3 g/dL (ref 6.0–8.5)
eGFR: 69 mL/min/{1.73_m2} (ref >59)

## 2020-12-26 LAB — PHENYTOIN LEVEL, TOTAL: Phenytoin (Dilantin), Serum: 11.3 ug/mL (ref 10.0–20.0)

## 2020-12-26 NOTE — Telephone Encounter (Signed)
Blood work shows a low therapeutic phenytoin level, therapeutic phenobarbital and primidone level, chemistry panel shows a chronic but stable low sodium level 130, mildly elevated potassium level 5.3, mildly low CO2 level of 19, liver profile is unremarkable.  CBC is unremarkable.  Given the fact that the primidone and phenobarbital levels are therapeutic, we will lower the Dilantin dose, she will stop the 150 mg dose in the morning, continue 200 mg in the evening.  Hopefully this will help the low sodium level as it [place] be related to SIADH.  In the future, we [place] consider eliminating the Dilantin altogether.

## 2020-12-27 ENCOUNTER — Telehealth: Payer: Self-pay | Admitting: Neurology

## 2020-12-27 NOTE — Telephone Encounter (Signed)
Pt called stating the primidone (MYSOLINE) 50 MG tablet is not helping with her tremors. Pt is wanting to know what she should do. Pt requesting a call back.

## 2020-12-30 DIAGNOSIS — H35371 Puckering of macula, right eye: Secondary | ICD-10-CM | POA: Diagnosis not present

## 2020-12-30 MED ORDER — PRIMIDONE 50 MG PO TABS: 50.0000 mg | ORAL_TABLET | ORAL | 1 refills | Status: DC

## 2020-12-30 MED ORDER — PRIMIDONE 250 MG PO TABS: 250.0000 mg | ORAL_TABLET | Freq: Every day | ORAL | 1 refills | Status: DC

## 2020-12-30 NOTE — Telephone Encounter (Signed)
I called the patient.  The patient has stopped her morning dose of Dilantin.  She is still having tremors, currently on 150 mg of Mysoline the morning and 100 mg in the evening.  We will go to a 250 mg tablet in the evening of Mysoline and a 50 mg in the morning equal to 300 mg total daily.

## 2020-12-30 NOTE — Addendum Note (Signed)
Addended by: Kathrynn Ducking on: 12/30/2020 07:37 AM   Modules accepted: Orders

## 2020-12-31 DIAGNOSIS — K582 Mixed irritable bowel syndrome: Secondary | ICD-10-CM | POA: Diagnosis not present

## 2020-12-31 DIAGNOSIS — Z85038 Personal history of other malignant neoplasm of large intestine: Secondary | ICD-10-CM | POA: Diagnosis not present

## 2021-01-01 ENCOUNTER — Other Ambulatory Visit: Payer: Self-pay | Admitting: *Deleted

## 2021-01-01 MED ORDER — PHENYTOIN SODIUM EXTENDED 200 MG PO CAPS: 200.0000 mg | ORAL_CAPSULE | Freq: Every day | ORAL | Status: DC

## 2021-01-01 MED ORDER — PHENYTOIN SODIUM EXTENDED 200 MG PO CAPS: 200.0000 mg | ORAL_CAPSULE | Freq: Every day | ORAL | 1 refills | Status: DC

## 2021-01-01 MED ORDER — ALPRAZOLAM 0.25 MG PO TABS: 0.2500 mg | ORAL_TABLET | Freq: Three times a day (TID) | ORAL | 1 refills | Status: DC | PRN

## 2021-01-01 NOTE — Telephone Encounter (Signed)
Refill has been sent for the Dilantin 200 mg daily

## 2021-01-01 NOTE — Telephone Encounter (Signed)
Pt called stating he phenytoin (DILANTIN) 200 MG ER capsule needs to be called into [facility].

## 2021-01-01 NOTE — Addendum Note (Signed)
Addended by: Verlin Grills on: 01/01/2021 11:14 AM   Modules accepted: Orders

## 2021-01-10 DIAGNOSIS — Z23 Encounter for immunization: Secondary | ICD-10-CM | POA: Diagnosis not present

## 2021-01-13 ENCOUNTER — Other Ambulatory Visit: Payer: Self-pay

## 2021-01-13 ENCOUNTER — Ambulatory Visit (INDEPENDENT_AMBULATORY_CARE_PROVIDER_SITE_OTHER): Payer: Medicare Other

## 2021-01-13 ENCOUNTER — Telehealth: Payer: Self-pay | Admitting: *Deleted

## 2021-01-13 DIAGNOSIS — E08621 Diabetes mellitus due to underlying condition with foot ulcer: Secondary | ICD-10-CM

## 2021-01-13 DIAGNOSIS — M5030 Other cervical disc degeneration, unspecified cervical region: Secondary | ICD-10-CM | POA: Diagnosis not present

## 2021-01-13 DIAGNOSIS — L97501 Non-pressure chronic ulcer of other part of unspecified foot limited to breakdown of skin: Secondary | ICD-10-CM

## 2021-01-13 DIAGNOSIS — M2042 Other hammer toe(s) (acquired), left foot: Secondary | ICD-10-CM | POA: Diagnosis not present

## 2021-01-13 DIAGNOSIS — R200 Anesthesia of skin: Secondary | ICD-10-CM | POA: Diagnosis not present

## 2021-01-13 DIAGNOSIS — L84 Corns and callosities: Secondary | ICD-10-CM | POA: Diagnosis not present

## 2021-01-13 DIAGNOSIS — E1142 Type 2 diabetes mellitus with diabetic polyneuropathy: Secondary | ICD-10-CM | POA: Diagnosis not present

## 2021-01-13 DIAGNOSIS — M2041 Other hammer toe(s) (acquired), right foot: Secondary | ICD-10-CM | POA: Diagnosis not present

## 2021-01-13 MED ORDER — PRIMIDONE 250 MG PO TABS: 250.0000 mg | ORAL_TABLET | Freq: Every day | ORAL | 1 refills | Status: DC

## 2021-01-13 NOTE — Telephone Encounter (Signed)
PUDS-01/13/21

## 2021-01-20 DIAGNOSIS — M170 Bilateral primary osteoarthritis of knee: Secondary | ICD-10-CM | POA: Diagnosis not present

## 2021-01-21 ENCOUNTER — Ambulatory Visit (INDEPENDENT_AMBULATORY_CARE_PROVIDER_SITE_OTHER): Payer: Medicare Other | Admitting: Podiatry

## 2021-01-21 ENCOUNTER — Encounter: Payer: Self-pay | Admitting: Podiatry

## 2021-01-21 ENCOUNTER — Other Ambulatory Visit: Payer: Self-pay

## 2021-01-21 DIAGNOSIS — M79674 Pain in right toe(s): Secondary | ICD-10-CM | POA: Diagnosis not present

## 2021-01-21 DIAGNOSIS — B351 Tinea unguium: Secondary | ICD-10-CM | POA: Diagnosis not present

## 2021-01-21 DIAGNOSIS — L84 Corns and callosities: Secondary | ICD-10-CM

## 2021-01-21 DIAGNOSIS — M79675 Pain in left toe(s): Secondary | ICD-10-CM

## 2021-01-21 DIAGNOSIS — E1142 Type 2 diabetes mellitus with diabetic polyneuropathy: Secondary | ICD-10-CM

## 2021-01-24 ENCOUNTER — Telehealth: Payer: Self-pay | Admitting: Podiatry

## 2021-01-24 NOTE — Telephone Encounter (Signed)
Pt left message stating the diabetic shoes she got the velcro strap is not staying down. She was not sure what she needs to do. Does she need an appt or can she just stop in..  I returned call and pt states the straps on one will not stay because she is having swelling in the foot. She is asking if we have an extension that we can get for them. I told pt we do not but if she would like we can schedule an appt to get a different shoe. She does not want to do that.  I told her she [place] try to call shoe market and see if they carry anything like that or if they could recommend a place to get something like that.

## 2021-01-26 NOTE — Progress Notes (Signed)
Subjective: [name] is a 87 y.o. female patient seen today for at risk follow up. She has h/o diabetic neuropathy. She is see for painful corns and calluses of both feet. She also relates  painful thick toenails that are difficult to trim. Pain interferes with ambulation. Aggravating factors include wearing enclosed shoe gear. Pain is relieved with periodic professional debridement.  Patient states their blood glucose was 253 mg/dl after breakfast this morning.   PCP is Bartholome Bill, MD. Last visit was: 11/05/2020.  New problems reported today: None.  Allergies  Allergen Reactions   Bystolic [Nebivolol Hcl] Other (See Comments)    "extreme weakness, heaviness in legs & arms, swelling in legs/arms/face, swollen abdomen, pain in bladder, feet pain, soreness in chest"   Carbamazepine Other (See Comments)    Blood poisoning  Other reaction(s): Unknown   Cholestyramine Other (See Comments)    "itching rash on stomach, bloated, nausea, vomiting, sleeplessness, extreme pain in arms"   Hydrazine Yellow [Tartrazine] Other (See Comments)    "does not reduce high blood pressure, pain in arm, high pressure, felt like I was on verge of heart attack, really weak"   Morphine Other (See Comments)    Feels morbid, weak, still in pain   Niacin Palpitations    Fast heart beat Other reaction(s): Unknown   Niaspan [Niacin Er] Palpitations and Other (See Comments)    "fast heart beat, high blood pressure"   Norvasc [Amlodipine Besylate] Other (See Comments)    "extreme fluid retention/pain)   Optivar [Azelastine Hcl] Photosensitivity   Repaglinide Hives   Sular [Nisoldipine Er] Other (See Comments)    "severe headaches, swelling eyes, hands, feet, shortness of breath, weak, flushed face, brain boiling, fluid retention, high blood sugar, nervous, heart fast beating"   Telmisartan Other (See Comments)    "headache, difficulty urinating, high blood sugar, fluid retention"   Amoxicillin-Pot  Clavulanate Rash and Other (See Comments)   Cefdinir Swelling    Vaginal irritation, breathing,    Ciprofloxacin Hcl Hives   Clonidine Other (See Comments)    Dry mouth, fluid retention   Clonidine Hydrochloride Other (See Comments)    Dry mouth, fluid retention   Codeine Nausea And Vomiting   Ezetimibe Other (See Comments)    Made weak Other reaction(s): Unknown   Hydroxychloroquine Other (See Comments)    Low platlets   Naproxen Other (See Comments)    Shrinks bladder   Sulfa Antibiotics Rash   Ziac [Bisoprolol-Hydrochlorothiazide] Other (See Comments)    "stopped urination"   Amlodipine Besy-Benazepril Hcl     Other reaction(s): cough   Azelastine Other (See Comments)    Unknown reaction - Per MAR   Elavil [Amitriptyline] Other (See Comments)    Gave Pt nightmares   Empagliflozin Other (See Comments)    "Caused yeast infection, slowed my urine"   Hydralazine Other (See Comments)    Unknown reaction - Per MAR   Iodine I 131 Tositumomab Other (See Comments)    Unknown reaction - Per MAR   Kenalog [Triamcinolone] Diarrhea    Per MAR   Keppra [Levetiracetam] Other (See Comments)    Shaking   Lamotrigine Itching and Other (See Comments)   Norvasc [Amlodipine] Other (See Comments)    Unknown reaction - Per MAR   Other Other (See Comments)    Other reaction(s): Unknown Other reaction(s): Unknown Other reaction(s): Unknown Other reaction(s): Unknown Other reaction(s): Unknown   Pregabalin Swelling    Other reaction(s): weight gain   Pseudoephedrine  Pseudoephedrine-Guaifenesin Er Other (See Comments)    Unknown reaction - Per MAR   Risedronate     Unknown reaction - Per MAR   Ru-Hist D [Brompheniramine-Phenylephrine] Other (See Comments)    Unknown reaction - Per MAR   Sumatriptan Other (See Comments)   Topiramate Other (See Comments)    Dry eyes Other reaction(s): Unknown   Ace Inhibitors Other (See Comments)    unknown   Actonel [Risedronate Sodium] Other  (See Comments)    unknown   Amlodipine Besylate Other (See Comments)    unknown   Aspirin Other (See Comments)    unknown   Atacand [Candesartan] Other (See Comments)    Unknown reaction - MAR   Bextra [Valdecoxib] Other (See Comments)    Unknown reaction - Per MAR   Bisoprolol-Hydrochlorothiazide Other (See Comments)    Unknown reaction - Per MAR   Candesartan Cilexetil Other (See Comments)    unknown   Cefadroxil Other (See Comments)    unknown   Celecoxib Rash   Hydrocodone Other (See Comments)    unknown   Hydrocodone-Acetaminophen Other (See Comments)    unknown   Iodinated Diagnostic Agents Rash    "All over"  Other reaction(s): Unknown   Meloxicam Other (See Comments)    unknown   Methylprednisolone Sodium Succinate Other (See Comments)    unknown   Nabumetone Other (See Comments)    Unknown reaction   Penicillins Other (See Comments)    unknown   Pseudoephedrine-Guaifenesin Other (See Comments)    unknown   Risedronate Sodium Other (See Comments)    unknown   Rofecoxib Other (See Comments)    Unknown reaction - MAR Other reaction(s): Unknown   Ru-Tuss [Chlorphen-Pse-Atrop-Hyos-Scop] Other (See Comments)    unknown   Sulfonamide Derivatives Other (See Comments)    unknown   Sulphur [Elemental Sulfur] Other (See Comments)    unknown   Telithromycin Other (See Comments)    unknown   Terfenadine Other (See Comments)    Unknown reaction - Per MAR   Trandolapril-Verapamil Hcl Er Other (See Comments)    Headache, difficulty urinating, high blood sugar, fluid retention  Pt is taking Tarka (trandolapril-verapamil) currently, but requests the medication stay in her allergy list   Trandolapril-Verapamil Hcl Er Other (See Comments)    Headache, difficulty urinating, high blood sugar, fluid retention  Pt is taking Tarka (trandolapril-verapamil) currently, but requests the medication stay in her allergy list   Valium [Diazepam] Other (See Comments)    Makes her  mean and hyper    PCP is Bartholome Bill, MD .  Objective: Physical Exam  General: Patient is a pleasant 87 y.o. Caucasian female WD, WN in NAD. AAO x 3.   Neurovascular Examination: Capillary fill time to digits <3 seconds b/l lower extremities. Faintly palpable DP pulse(s) b/l lower extremities. Faintly palpable PT pulse(s) b/l lower extremities. Pedal hair absent. Lower extremity skin temperature gradient within normal limits. No pain with calf compression b/l. No edema noted b/l lower extremities.  Protective sensation diminished with 10g monofilament b/l.  Dermatological:  Skin warm and supple b/l lower extremities. No open wounds b/l lower extremities. No interdigital macerations b/l lower extremities. Toenails 1-5 b/l elongated, discolored, dystrophic, thickened, crumbly with subungual debris and tenderness to dorsal palpation. Hyperkeratotic lesion(s) L 2nd toe, submet head 1 left foot, and submet head 1 right foot.  No erythema, no edema, no drainage, no fluctuance.  Musculoskeletal:  Normal muscle strength 5/5 to all lower extremity muscle groups bilaterally.  No pain crepitus or joint limitation noted with ROM b/l lower extremities. Hammertoe(s) noted to the 1-5 bilaterally.  Assessment: 1. Pain due to onychomycosis of toenails of both feet   2. Corns and callosities   3. Diabetic peripheral neuropathy associated with type 2 diabetes mellitus ([place])    Plan: Patient was evaluated and treated and all questions answered. Consent given for treatment as described below: -Examined patient. -Continue diabetic foot care principles: inspect feet daily, monitor glucose as recommended by PCP and/or Endocrinologist, and follow prescribed diet per PCP, Endocrinologist and/or dietician. -Patient to continue soft, supportive shoe gear daily. -Toenails 1-5 b/l were debrided in length and girth with sterile nail nippers and dremel without iatrogenic bleeding.  -Corn(s) L 2nd toe and  callus(es) submet head 1 left foot and submet head 1 right foot were pared utilizing sterile scalpel blade without incident. Total number debrided =3. -Patient to report any pedal injuries to medical professional immediately. -Patient/POA to call should there be question/concern in the interim.  Return in about 9 weeks (around 03/25/2021).  Marzetta Board, DPM

## 2021-01-29 ENCOUNTER — Emergency Department (HOSPITAL_COMMUNITY)
Admission: EM | Admit: 2021-01-29 | Discharge: 2021-01-29 | Disposition: A | Discharge status: Home / Self Care | Payer: Medicare Other | Attending: Emergency Medicine | Admitting: Emergency Medicine

## 2021-01-29 ENCOUNTER — Encounter (HOSPITAL_COMMUNITY): Payer: Self-pay

## 2021-01-29 ENCOUNTER — Emergency Department (HOSPITAL_COMMUNITY): Payer: Medicare Other

## 2021-01-29 DIAGNOSIS — Z85038 Personal history of other malignant neoplasm of large intestine: Secondary | ICD-10-CM | POA: Insufficient documentation

## 2021-01-29 DIAGNOSIS — E041 Nontoxic single thyroid nodule: Secondary | ICD-10-CM | POA: Diagnosis not present

## 2021-01-29 DIAGNOSIS — Y92009 Unspecified place in unspecified non-institutional (private) residence as the place of occurrence of the external cause: Secondary | ICD-10-CM | POA: Insufficient documentation

## 2021-01-29 DIAGNOSIS — Z85828 Personal history of other malignant neoplasm of skin: Secondary | ICD-10-CM | POA: Insufficient documentation

## 2021-01-29 DIAGNOSIS — E119 Type 2 diabetes mellitus without complications: Secondary | ICD-10-CM | POA: Diagnosis not present

## 2021-01-29 DIAGNOSIS — Z853 Personal history of malignant neoplasm of breast: Secondary | ICD-10-CM | POA: Insufficient documentation

## 2021-01-29 DIAGNOSIS — S01112A Laceration without foreign body of left eyelid and periocular area, initial encounter: Secondary | ICD-10-CM | POA: Insufficient documentation

## 2021-01-29 DIAGNOSIS — E039 Hypothyroidism, unspecified: Secondary | ICD-10-CM | POA: Diagnosis not present

## 2021-01-29 DIAGNOSIS — I10 Essential (primary) hypertension: Secondary | ICD-10-CM | POA: Diagnosis not present

## 2021-01-29 DIAGNOSIS — S0990XA Unspecified injury of head, initial encounter: Secondary | ICD-10-CM | POA: Diagnosis not present

## 2021-01-29 DIAGNOSIS — Z79899 Other long term (current) drug therapy: Secondary | ICD-10-CM | POA: Insufficient documentation

## 2021-01-29 DIAGNOSIS — S0181XA Laceration without foreign body of other part of head, initial encounter: Secondary | ICD-10-CM

## 2021-01-29 DIAGNOSIS — Z85841 Personal history of malignant neoplasm of brain: Secondary | ICD-10-CM | POA: Diagnosis not present

## 2021-01-29 DIAGNOSIS — W01198A Fall on same level from slipping, tripping and stumbling with subsequent striking against other object, initial encounter: Secondary | ICD-10-CM | POA: Diagnosis not present

## 2021-01-29 DIAGNOSIS — W19XXXA Unspecified fall, initial encounter: Secondary | ICD-10-CM

## 2021-01-29 DIAGNOSIS — J45909 Unspecified asthma, uncomplicated: Secondary | ICD-10-CM | POA: Diagnosis not present

## 2021-01-29 DIAGNOSIS — Z96641 Presence of right artificial hip joint: Secondary | ICD-10-CM | POA: Diagnosis not present

## 2021-01-29 DIAGNOSIS — Z794 Long term (current) use of insulin: Secondary | ICD-10-CM | POA: Insufficient documentation

## 2021-01-29 DIAGNOSIS — M4312 Spondylolisthesis, cervical region: Secondary | ICD-10-CM | POA: Diagnosis not present

## 2021-01-29 DIAGNOSIS — G9389 Other specified disorders of brain: Secondary | ICD-10-CM | POA: Diagnosis not present

## 2021-01-29 DIAGNOSIS — R9431 Abnormal electrocardiogram [ECG] [EKG]: Secondary | ICD-10-CM | POA: Diagnosis not present

## 2021-01-29 DIAGNOSIS — S199XXA Unspecified injury of neck, initial encounter: Secondary | ICD-10-CM | POA: Diagnosis not present

## 2021-01-29 DIAGNOSIS — M47812 Spondylosis without myelopathy or radiculopathy, cervical region: Secondary | ICD-10-CM | POA: Diagnosis not present

## 2021-01-29 DIAGNOSIS — Z9889 Other specified postprocedural states: Secondary | ICD-10-CM | POA: Diagnosis not present

## 2021-01-29 LAB — CBC WITH DIFFERENTIAL/PLATELET
Abs Immature Granulocytes: 0.03 10*3/uL (ref 0.00–0.07)
Basophils Absolute: 0 10*3/uL (ref 0.0–0.1)
Basophils Relative: 0 %
Eosinophils Absolute: 0.1 10*3/uL (ref 0.0–0.5)
Eosinophils Relative: 1 %
HCT: 35.1 % — ABNORMAL LOW (ref 36.0–46.0)
Hemoglobin: 11.4 g/dL — ABNORMAL LOW (ref 12.0–15.0)
Immature Granulocytes: 0 %
Lymphocytes Relative: 25 %
Lymphs Abs: 2 10*3/uL (ref 0.7–4.0)
MCH: 31.1 pg (ref 26.0–34.0)
MCHC: 32.5 g/dL (ref 30.0–36.0)
MCV: 95.9 fL (ref 80.0–100.0)
Monocytes Absolute: 0.6 10*3/uL (ref 0.1–1.0)
Monocytes Relative: 8 %
Neutro Abs: 5.2 10*3/uL (ref 1.7–7.7)
Neutrophils Relative %: 66 %
Platelets: 296 10*3/uL (ref 150–400)
RBC: 3.66 MIL/uL — ABNORMAL LOW (ref 3.87–5.11)
RDW: 14.7 % (ref 11.5–15.5)
WBC: 7.9 10*3/uL (ref 4.0–10.5)
nRBC: 0 % (ref 0.0–0.2)

## 2021-01-29 LAB — COMPREHENSIVE METABOLIC PANEL
ALT: 23 U/L (ref 0–44)
AST: 20 U/L (ref 15–41)
Albumin: 4 g/dL (ref 3.5–5.0)
Alkaline Phosphatase: 82 U/L (ref 38–126)
Anion gap: 11 (ref 5–15)
BUN: 12 mg/dL (ref 8–23)
CO2: 20 mmol/L — ABNORMAL LOW (ref 22–32)
Calcium: 8.6 mg/dL — ABNORMAL LOW (ref 8.9–10.3)
Chloride: 102 mmol/L (ref 98–111)
Creatinine, Ser: 0.75 mg/dL (ref 0.44–1.00)
GFR, Estimated: >60 mL/min (ref >60)
Glucose, Bld: 230 mg/dL — ABNORMAL HIGH (ref 70–99)
Potassium: 4.6 mmol/L (ref 3.5–5.1)
Sodium: 133 mmol/L — ABNORMAL LOW (ref 135–145)
Total Bilirubin: 0.4 mg/dL (ref 0.3–1.2)
Total Protein: 6.6 g/dL (ref 6.5–8.1)

## 2021-01-29 MED ORDER — TETANUS-DIPHTH-ACELL PERTUSSIS 5-2.5-18.5 LF-MCG/0.5 IM SUSY
0.5000 mL | PREFILLED_SYRINGE | Freq: Once | INTRAMUSCULAR | Status: DC
  Filled 2021-01-29: qty 0.5

## 2021-01-29 MED ORDER — LIDOCAINE-EPINEPHRINE-TETRACAINE (LET) TOPICAL GEL
3.0000 mL | Freq: Once | TOPICAL | Status: AC
  Administered 2021-01-29: 3 mL via TOPICAL
  Filled 2021-01-29: qty 3

## 2021-01-29 NOTE — Discharge Instructions (Signed)
The CAT scans showed no sign of injury.  The stitches should be removed in 3 to 5 days.  If you start having severe headaches, difficulty walking, recurrent falling you should return to the emergency room.

## 2021-01-29 NOTE — ED Triage Notes (Signed)
Pt presents with c/o fall that occurred earlier today. Pt reports she fell forward and hit her head on the dresser. Pt has a laceration to her left eyebrow, bleeding controlled.

## 2021-01-29 NOTE — ED Provider Notes (Signed)
[place] DEPT Provider Note   CSN: [id number] Arrival date & time: 01/29/21  1649     History Chief Complaint  Patient presents with   April Holding Cohick is a 87 y.o. female.  Patient is an 87 year old female with a history of diabetes, hypertension, seizures, CAD, hyperlipidemia and prior balance issues who uses a walker presenting today after a fall.  Patient reports that she had gotten up and was starting to walk into the other room when she lost her balance and started falling forward.  She hit the left side of her eyebrow on the corner of her dresser but then landed on a very soft chair.  She denies any loss of consciousness and remembers the entire event.  She did not feel lightheaded, woozy, chest pain, palpitations or shortness of breath prior to or after the fall.  She was unable to get up so had to call 911 to come and help her get off the floor.  She denies any pain in her upper or lower extremities.  She has no neck pain or vision changes at this time.  She does not take any anticoagulation.  Tetanus shot is up-to-date.  The history is provided by the patient and medical records.  Fall      Past Medical History:  Diagnosis Date   Anemia    Anxiety    Asthma    Breast cancer ([place]) 2014   right breast   Cancer of right breast ([place]) 12/26/12   right breast 12:00 o'clock, DCIS   Carotid artery disease (HCC)    Carpal tunnel syndrome, bilateral    Chronic bronchitis (HCC)    Chronic cough    Chronic facial pain    Chronic foot pain    Colon cancer ([place]) dx'd 0/7371   Complication of anesthesia    Sore jaw; could not chew or move mouth, prolonged sedation   Convulsions/seizures ([place]) 10/16/2014   Diabetes mellitus    type 2 niddm x 20 years   Dyslipidemia    Ejection fraction    Gait abnormality 12/04/2019   GERD (gastroesophageal reflux disease)    Hammer toe    bilateral   History of colonic polyps    History of meningioma     HTN (hypertension)    Hx of radiation therapy 03/07/13- 03/29/13   right breast 4250 cGy 17 sessions   Hyperlipidemia    Hypokalemia    Hyponatremia    Hypothyroidism    IBS (irritable bowel syndrome)    Melanoma ([place])    Metatarsal bone fracture 2014   Multiple drug allergies    Nontoxic thyroid nodule    Obesity    Osteoarthritis    Osteoporosis    Palpitations    Personal history of radiation therapy 2014   Seasonal allergies    Skin cancer    Syncope    Tremor, essential 08/18/2016   Vitamin B12 deficiency    Vitamin D deficiency     Patient Active Problem List   Diagnosis Date Noted   Compression fracture of body of thoracic vertebra ([place]) 08/21/2020   Dilantin toxicity 08/17/2020   Postoperative anemia due to acute blood loss 06/06/2020   Leukocytosis    Closed right hip fracture ([place]) 06/04/2020   Constipation 05/27/2020   Dyslipidemia 05/27/2020   Enterocolitis due to Clostridium difficile, not specified as recurrent 05/27/2020   Pleural effusion on right 04/01/2020   Acute respiratory failure with hypoxia ([place]) 04/01/2020  Pneumonia of right lung due to infectious organism 04/01/2020   Uncontrolled type 2 diabetes mellitus with hyperglycemia, with long-term current use of insulin ([place]) 04/01/2020   Abnormal CT of the chest 04/01/2020   Subacute pulmonary embolism ([place]) 53/97/6734   Acute metabolic encephalopathy 19/37/9024   Adenocarcinoma of colon ([place]) 04/01/2020   Nausea & vomiting 03/12/2020   Cancer of ascending colon pT3pN0 (0/12 LN) s/p lap right colectomy 03/06/2020 03/09/2020   History of multiple allergies 03/09/2020   S/P right hemicolectomy 03/06/2020   Closed fracture of shaft of tibia 02/12/2020   Abnormal feces 12/11/2019   Gait abnormality 12/04/2019   Impaired left ventricular function 09/05/2019   Pain in joint of left shoulder 02/27/2019   Hypokalemia    Effusion of left knee joint    Hyponatremia    Left patella fracture 10/26/2018    Fracture of patella 10/26/2018   Pain in left knee 09/29/2018   Acquired hypothyroidism 08/25/2017   H/O excision of tumor of brain meninges 08/25/2017   Hammer toe 08/25/2017   History of breast cancer 08/25/2017   Nontoxic thyroid nodule 08/25/2017   Osteopetrosis 08/25/2017   Dysuria 01/19/2017   Vitamin D deficiency 11/18/2016   Tremor, essential 08/18/2016   Seizure disorder ([place]) 10/16/2014   Brain tumor (benign) ([place]) 10/18/2013   Subdural hemorrhage ([place]) 10/09/2013   Anxiety 10/09/2013   Compression fracture of T12 vertebra ([place]) 10/09/2013   Nontoxic multinodular goiter 09/05/2013   Syncope 08/27/2013   Carotid artery disease ([place]) 08/27/2013   Abnormal thyroid ultrasound 08/27/2013   Cancer of central portion of female breast ([place]) 12/30/2012   Osteoporosis 03/06/2010   ASTHMA 03/05/2009   Asthma 03/05/2009   IRRITABLE BOWEL SYNDROME  diarrhea type 03/21/2008   ANEMIA-NOS 12/16/2006   GERD 12/16/2006   Insulin-requiring or dependent type II diabetes mellitus ([place]) 10/29/2006   Mixed hyperlipidemia 10/29/2006   Essential hypertension 10/29/2006   Allergic rhinitis, cause unspecified 10/29/2006   OSTEOARTHRITIS 10/29/2006    Past Surgical History:  Procedure Laterality Date   ABDOMINAL HYSTERECTOMY     BIOPSY  11/07/2019   Procedure: BIOPSY;  Surgeon: Ronald Lobo, MD;  Location: [facility];  Service: Endoscopy;;   BRAIN SURGERY     BREAST BIOPSY Right 01/24/2013   Procedure: RE-EXCICION OF BREAST CANCER, ANTERIOR MARGINS;  Surgeon: Edward Jolly, MD;  Location: [facility];  Service: General;  Laterality: Right;   BREAST LUMPECTOMY Right 2014   BREAST LUMPECTOMY WITH NEEDLE LOCALIZATION Right 01/17/2013   Procedure: BREAST LUMPECTOMY WITH NEEDLE LOCALIZATION;  Surgeon: Edward Jolly, MD;  Location: [facility];  Service: General;  Laterality: Right;   BUNIONECTOMY Bilateral    CATARACT EXTRACTION W/ INTRAOCULAR LENS IMPLANT Right    CHOLECYSTECTOMY      COLONOSCOPY     COLONOSCOPY WITH PROPOFOL N/A 11/07/2019   Procedure: COLONOSCOPY WITH PROPOFOL;  Surgeon: Ronald Lobo, MD;  Location: [facility];  Service: Endoscopy;  Laterality: N/A;   CRANIOTOMY Right 10/18/2013   Procedure: CRANIOTOMY TUMOR EXCISION;  Surgeon: Floyce Stakes, MD;  [facility];  Service: Neurosurgery;  Laterality: Right;   ESOPHAGOGASTRODUODENOSCOPY (EGD) WITH PROPOFOL N/A 11/07/2019   Procedure: ESOPHAGOGASTRODUODENOSCOPY (EGD) WITH PROPOFOL;  Surgeon: Ronald Lobo, MD;  Location: [facility];  Service: Endoscopy;  Laterality: N/A;   EYE SURGERY     HERNIA REPAIR     IR THORACENTESIS ASP PLEURAL SPACE W/IMG GUIDE  04/01/2020   KNEE ARTHROSCOPY Bilateral    LAPAROSCOPIC RIGHT HEMI COLECTOMY Right 03/06/2020  Procedure: LAPAROSCOPIC RIGHT HEMI COLECTOMY WITH TAP BLOCK AND LYSIS OF ADHESIONS;  Surgeon: Ileana Roup, MD;  Location: [facility];  Service: General;  Laterality: Right;   POLYPECTOMY     small adenomatous   TOTAL HIP ARTHROPLASTY Right 06/05/2020   Procedure: ARTHROPLASTY  ANTERIOR APPROACH. RIGHT HIP;  Surgeon: Rod Can, MD;  Location: [facility];  Service: Orthopedics;  Laterality: Right;   ULNAR TUNNEL RELEASE       OB History   No obstetric history on file.     Family History  Problem Relation Age of Onset   Heart disease Mother    Osteoporosis Mother    Diabetes Father    Pancreatic cancer Father    Bone cancer Sister    Rectal cancer Sister    Thyroid disease Sister        benign goiter resected   Prostate cancer Brother    Colon cancer Brother    Colon cancer Other     Social History   Tobacco Use   Smoking status: Never   Smokeless tobacco: Never  Vaping Use   Vaping Use: Never used  Substance Use Topics   Alcohol use: No   Drug use: No    Home Medications Prior to Admission medications   Medication Sig Start Date End Date Taking? Authorizing Provider  acetaminophen (TYLENOL) 500 MG tablet Take 2  tablets (1,000 mg total) by mouth 3 (three) times daily. 08/21/20   Geradine Girt, DO  acidophilus (RISAQUAD) CAPS capsule Take 1 capsule by mouth daily. 08/21/20   Geradine Girt, DO  ALPRAZolam Duanne Moron) 0.25 MG tablet Take 1 tablet (0.25 mg total) by mouth 3 (three) times daily as needed for anxiety. 01/01/21   Kathrynn Ducking, MD  benzonatate (TESSALON) 200 MG capsule Take 1 capsule (200 mg total) by mouth 2 (two) times daily as needed for cough. 08/21/20   Geradine Girt, DO  Blood Glucose Monitoring Suppl (PRODIGY VOICE BLOOD GLUCOSE) w/Device KIT Use to check blood sugar 1 time per day. 10/18/15   Renato Shin, MD  Cholecalciferol (VITAMIN D-3) 125 MCG (5000 UT) TABS Take 5,000 Units by mouth daily.    [provider]  cholestyramine (QUESTRAN) 4 g packet Take 4 g by mouth 2 (two) times daily.    [provider]  cholestyramine light (PREVALITE) 4 GM/DOSE powder Cholestyramine Light 4 gram powder for susp in a packet  MIX 1 PACKET WITH WATER OR NON CARBONATED DRINK AND DRINK BY MOUTH TWICE DAILY AT LEAST 2 HOURS AWAY FROM OTHER MEDICINES    [provider]  CHOLESTYRAMINE PO  08/22/20   [provider]  clindamycin (CLEOCIN) 300 MG capsule  12/30/20   [provider]  diclofenac Sodium (VOLTAREN) 1 % GEL Apply 2 g topically 4 (four) times daily as needed (pain). knees 06/01/19   [provider]  escitalopram (LEXAPRO) 10 MG tablet Take 1 tablet (10 mg total) by mouth daily. 07/08/20   Fargo, Amy E, NP  feeding supplement, GLUCERNA SHAKE, (GLUCERNA SHAKE) LIQD Take 237 mLs by mouth daily.    [provider]  glucose blood (GLUCOSE METER TEST) test strip See admin instructions.    [provider]  Glucose Blood (PRODIGY VOICE BLOOD GLUCOSE VI)     [provider]  insulin aspart (NOVOLOG) 100 UNIT/ML FlexPen Inject 2-12 Units into the skin See admin instructions. Injects 2-10 units of insulin under the skin 3 times a day  per sliding scale:  CBG 0-150: 2 units; CBG 151-200: 4 units; CBG 201-250: 6 units; CBG 251-300: 8 units; CBG 301-350: 10 units; CBG 351-400: 12 units; CBG >400: 12 units and notify the MD/NP 07/08/20   Cleophas Dunker, Amy E, NP  insulin glargine (LANTUS SOLOSTAR) 100 UNIT/ML Solostar Pen Inject 10 Units into the skin at bedtime. Patient taking differently: Inject 14 Units into the skin at bedtime. 08/21/20   Geradine Girt, DO  insulin lispro (HUMALOG JUNIOR KWIKPEN) 100 UNIT/ML KwikPen Junior Humalog KwikPen Insulin    [provider]  Insulin Pen Needle 32G X 4 MM MISC Used to inject insulin 3x daily 11/19/16   Renato Shin, MD  levothyroxine (SYNTHROID) 75 MCG tablet Take 1 tablet (75 mcg total) by mouth daily before breakfast. 07/08/20   Fargo, Amy E, NP  levothyroxine (SYNTHROID) 75 MCG tablet Take by mouth. 01/13/21   [provider]  lidocaine (LIDODERM) 5 % Place 1 patch onto the skin daily. Remove & Discard patch within 12 hours or as directed by MD 07/08/20   Yvonna Alanis, NP  lidocaine (XYLOCAINE) 2 % solution  10/20/20   [provider]  ondansetron (ZOFRAN-ODT) 4 MG disintegrating tablet Take 1 tablet (4 mg total) by mouth every 6 (six) hours as needed for nausea. 03/19/20   Ileana Roup, MD  oxyCODONE (ROXICODONE) 5 MG immediate release tablet Take 0.5 tablets (2.5 mg total) by mouth every 6 (six) hours as needed for severe pain. 08/22/20   Geradine Girt, DO  pantoprazole (PROTONIX) 40 MG tablet Take 1 tablet (40 mg total) by mouth daily at 6 (six) AM. 07/08/20   Fargo, Amy E, NP  phenytoin (DILANTIN) 200 MG ER capsule Take 1 capsule (200 mg total) by mouth at bedtime. 01/01/21   Kathrynn Ducking, MD  Polyethyl Glycol-Propyl Glycol (SYSTANE) 0.4-0.3 % SOLN Place 2 drops into both eyes every 12 (twelve) hours as needed (dry eyes).    [provider]  primidone (MYSOLINE) 250 MG tablet Take 1 tablet (250 mg total) by mouth at bedtime. 01/13/21   Kathrynn Ducking,  MD  primidone (MYSOLINE) 50 MG tablet Take 1 tablet (50 mg total) by mouth every morning. 12/30/20   Kathrynn Ducking, MD  thiamine 250 MG tablet 250 mg.    [provider]  tiZANidine (ZANAFLEX) 2 MG tablet  09/21/20   [provider]  traMADol Veatrice Bourbon) 50 MG tablet  12/03/20   [provider]  trandolapril ([place]) 2 MG tablet  10/19/20   [provider]  trandolapril-verapamil (TARKA) 2-240 MG tablet Take 1 tablet by mouth at bedtime. Patient not taking: Reported on 12/24/2020 07/08/20   Yvonna Alanis, NP  Verapamil HCl CR 200 MG CP24  10/19/20   [provider]    Allergies    Bystolic [nebivolol hcl], Carbamazepine, Cholestyramine, Hydrazine yellow [tartrazine], Morphine, Niacin, Niaspan [niacin er], Norvasc [amlodipine besylate], Optivar [azelastine hcl], Repaglinide, Sular [nisoldipine er], Telmisartan, Amoxicillin-pot clavulanate, Cefdinir, Ciprofloxacin hcl, Clonidine, Clonidine hydrochloride, Codeine, Ezetimibe, Hydroxychloroquine, Naproxen, Sulfa antibiotics, Ziac [bisoprolol-hydrochlorothiazide], Amlodipine besy-benazepril hcl, Azelastine, Elavil [amitriptyline], Empagliflozin, Hydralazine, Iodine i 131 tositumomab, Kenalog [triamcinolone], Keppra [levetiracetam], Lamotrigine, Norvasc [amlodipine], Other, Pregabalin, Pseudoephedrine, Pseudoephedrine-guaifenesin er, Risedronate, Ru-hist d [brompheniramine-phenylephrine], Sumatriptan, Topiramate, Ace inhibitors, Actonel [risedronate sodium], Amlodipine besylate, Aspirin, Atacand [candesartan], Bextra [valdecoxib], Bisoprolol-hydrochlorothiazide, Candesartan cilexetil, Cefadroxil, Celecoxib, Hydrocodone, Hydrocodone-acetaminophen, Iodinated diagnostic agents, Meloxicam, Methylprednisolone sodium succinate, Nabumetone, Penicillins, Pseudoephedrine-guaifenesin, Risedronate sodium, Rofecoxib, Ru-tuss [chlorphen-pse-atrop-hyos-scop], Sulfonamide derivatives, Sulphur [elemental sulfur], Telithromycin, Terfenadine,  Trandolapril-verapamil hcl er, Trandolapril-verapamil hcl  er, and Valium [diazepam]  Review of Systems   Review of Systems  All other systems reviewed and are negative.  Physical Exam Updated Vital Signs BP 120/86 (BP Location: Left Arm)   Pulse 73   Temp 98.1 F (36.7 C) (Oral)   Resp 18   SpO2 98%   Physical Exam Vitals and nursing note reviewed.  Constitutional:      General: She is not in acute distress.    Appearance: She is well-developed.  HENT:     Head: Normocephalic. Contusion and laceration present.   Eyes:     Pupils: Pupils are equal, round, and reactive to light.  Cardiovascular:     Rate and Rhythm: Normal rate and regular rhythm.     Heart sounds: Normal heart sounds. No murmur heard.   No friction rub.  Pulmonary:     Effort: Pulmonary effort is normal.     Breath sounds: Normal breath sounds. No wheezing or rales.  Abdominal:     General: Bowel sounds are normal. There is no distension.     Palpations: Abdomen is soft.     Tenderness: There is no abdominal tenderness. There is no guarding or rebound.  Musculoskeletal:        General: No tenderness. Normal range of motion.     Cervical back: Normal range of motion. No tenderness.     Right lower leg: No edema.     Left lower leg: No edema.     Comments: No edema.  Full range of motion of the bilateral shoulders, elbows, wrists, hips, knees and ankles without tenderness.  Skin:    General: Skin is warm and dry.     Findings: No rash.  Neurological:     Mental Status: She is alert and oriented to person, place, and time. Mental status is at baseline.     Cranial Nerves: No cranial nerve deficit.  Psychiatric:        Mood and Affect: Mood normal.        Behavior: Behavior normal.    ED Results / Procedures / Treatments   Labs (all labs ordered are listed, but only abnormal results are displayed) Labs Reviewed  CBC WITH DIFFERENTIAL/PLATELET - Abnormal; Notable for the following components:       Result Value   RBC 3.66 (*)    Hemoglobin 11.4 (*)    HCT 35.1 (*)    All other components within normal limits  COMPREHENSIVE METABOLIC PANEL - Abnormal; Notable for the following components:   Sodium 133 (*)    CO2 20 (*)    Glucose, Bld 230 (*)    Calcium 8.6 (*)    All other components within normal limits    EKG EKG Interpretation  Date/Time:  Wednesday January 29 2021 17:38:32 EDT Ventricular Rate:  79 PR Interval:  196 QRS Duration: 95 QT Interval:  400 QTC Calculation: 459 R Axis:   -51 Text Interpretation: Sinus rhythm Inferior infarct, old Anteroseptal infarct, old No significant change since last tracing Confirmed by Blanchie Dessert [phone]) on 01/29/2021 6:05:19 PM  Radiology CT Head Wo Contrast  Result Date: 01/29/2021 CLINICAL DATA:  [place], hit head, left supraorbital laceration EXAM: CT HEAD WITHOUT CONTRAST TECHNIQUE: Contiguous axial images were obtained from the base of the skull through the vertex without intravenous contrast. COMPARISON:  08/19/2020 FINDINGS: Brain: Chronic encephalomalacia within the right frontal lobe. Focal hypodensities within the left basal ganglia and right thalamus are stable consistent with chronic lacunar infarcts. No signs  of acute infarct or hemorrhage. The lateral ventricles and remaining midline structures are unremarkable. No acute extra-axial fluid collections. No mass effect. Vascular: No hyperdense vessel or unexpected calcification. Skull: Stable postsurgical changes from right frontal craniotomy. No acute bony abnormalities. Sinuses/Orbits: No acute finding. Other: None. IMPRESSION: 1. No acute intracranial process. 2. Stable postsurgical changes and right frontal encephalomalacia. Electronically Signed   By: Randa Ngo M.D.   On: 01/29/2021 18:59    Procedures Procedures   LACERATION REPAIR Performed by: [employer] Authorized by: Blanchie Dessert Consent: Verbal consent obtained. Risks and benefits: risks,  benefits and alternatives were discussed Consent given by: patient Patient identity confirmed: provided demographic data Prepped and Draped in normal sterile fashion Wound explored  Laceration Location: left eyebrow  Laceration Length: 1cm  No Foreign Bodies seen or palpated  Anesthesia:topical  Local anesthetic: LET  Anesthetic total: 1 ml  Irrigation method: syringe Amount of cleaning: standard  Skin closure: 6.0 prolene  Number of sutures: 4  Technique: simple interrupted  Patient tolerance: Patient tolerated the procedure well with no immediate complications.   Medications Ordered in ED Medications  Tdap (BOOSTRIX) injection 0.5 mL (0.5 mLs Intramuscular Not Given 01/29/21 1840)  lidocaine-EPINEPHrine-tetracaine (LET) topical gel (3 mLs Topical Given 01/29/21 1841)    ED Course  I have reviewed the triage vital signs and the nursing notes.  Pertinent labs & imaging results that were available during my care of the patient were reviewed by me and considered in my medical decision making (see chart for details).    MDM Rules/Calculators/A&P                           Patient presenting today after a fall at home.  No syncope or loss of consciousness after the fall.  Patient is not anticoagulated.  Tetanus shot up-to-date.  Laceration repaired as above.  CBC and CMP are at baseline other than a blood sugar of 333.  Head and cervical spine are negative for acute pathology.  EKG without acute findings.  Wound repaired as above.  Patient stable for discharge.  MDM   Amount and/or Complexity of Data Reviewed Clinical lab tests: ordered and reviewed Tests in the radiology section of CPT: ordered and reviewed Tests in the medicine section of CPT: ordered and reviewed Independent visualization of images, tracings, or specimens: yes    Final Clinical Impression(s) / ED Diagnoses Final diagnoses:  Fall, initial encounter  Facial laceration, initial encounter     Rx / DC Orders ED Discharge Orders     None        Blanchie Dessert, MD 01/29/21 1942

## 2021-01-29 NOTE — ED Provider Notes (Signed)
Emergency Medicine Provider Triage Evaluation Note  [name] , a 87 y.o. female  was evaluated in triage.  Pt complains of fall. States she had been laying down and upon standing she got lightheaded and fell. She hit her head on the dresser.  Review of Systems  Positive: Head injury Negative: loc  Physical Exam  BP (!) 164/92 (BP Location: Left Arm)   Pulse 80   Temp 98.1 F (36.7 C) (Oral)   Resp 18   SpO2 98%  Gen:   Awake, no distress   Resp:  Normal effort  MSK:   Moves extremities without difficulty  Other:    Medical Decision Making  Medically screening exam initiated at 5:16 PM.  Appropriate orders placed.  [name] was informed that the remainder of the evaluation will be completed by another provider, this initial triage assessment does not replace that evaluation, and the importance of remaining in the ED until their evaluation is complete.     [name] 01/29/21 1716    Yolanda Dessert, MD 01/29/21 1730

## 2021-02-03 DIAGNOSIS — Z4802 Encounter for removal of sutures: Secondary | ICD-10-CM | POA: Diagnosis not present

## 2021-02-03 DIAGNOSIS — S0181XS Laceration without foreign body of other part of head, sequela: Secondary | ICD-10-CM | POA: Diagnosis not present

## 2021-02-11 DIAGNOSIS — I10 Essential (primary) hypertension: Secondary | ICD-10-CM | POA: Diagnosis not present

## 2021-02-11 DIAGNOSIS — M47812 Spondylosis without myelopathy or radiculopathy, cervical region: Secondary | ICD-10-CM | POA: Diagnosis not present

## 2021-02-11 DIAGNOSIS — M546 Pain in thoracic spine: Secondary | ICD-10-CM | POA: Diagnosis not present

## 2021-02-11 DIAGNOSIS — Z8781 Personal history of (healed) traumatic fracture: Secondary | ICD-10-CM | POA: Diagnosis not present

## 2021-02-17 DIAGNOSIS — M170 Bilateral primary osteoarthritis of knee: Secondary | ICD-10-CM | POA: Diagnosis not present

## 2021-02-22 DIAGNOSIS — Z23 Encounter for immunization: Secondary | ICD-10-CM | POA: Diagnosis not present

## 2021-02-24 DIAGNOSIS — M170 Bilateral primary osteoarthritis of knee: Secondary | ICD-10-CM | POA: Diagnosis not present

## 2021-03-03 DIAGNOSIS — M170 Bilateral primary osteoarthritis of knee: Secondary | ICD-10-CM | POA: Diagnosis not present

## 2021-03-06 DIAGNOSIS — J22 Unspecified acute lower respiratory infection: Secondary | ICD-10-CM | POA: Diagnosis not present

## 2021-03-10 ENCOUNTER — Other Ambulatory Visit: Payer: Self-pay

## 2021-03-10 MED ORDER — PRIMIDONE 250 MG PO TABS: 250.0000 mg | ORAL_TABLET | Freq: Every day | ORAL | 1 refills | Status: DC

## 2021-03-25 ENCOUNTER — Other Ambulatory Visit: Payer: Self-pay

## 2021-03-25 ENCOUNTER — Ambulatory Visit (INDEPENDENT_AMBULATORY_CARE_PROVIDER_SITE_OTHER): Payer: Medicare Other | Admitting: Podiatry

## 2021-03-25 DIAGNOSIS — B351 Tinea unguium: Secondary | ICD-10-CM

## 2021-03-25 DIAGNOSIS — L84 Corns and callosities: Secondary | ICD-10-CM

## 2021-03-25 DIAGNOSIS — M79675 Pain in left toe(s): Secondary | ICD-10-CM

## 2021-03-25 DIAGNOSIS — E1142 Type 2 diabetes mellitus with diabetic polyneuropathy: Secondary | ICD-10-CM

## 2021-03-25 DIAGNOSIS — M79674 Pain in right toe(s): Secondary | ICD-10-CM

## 2021-03-29 NOTE — Progress Notes (Signed)
Left due to excessive wait time.

## 2021-03-30 ENCOUNTER — Encounter (HOSPITAL_COMMUNITY): Payer: Self-pay | Admitting: Emergency Medicine

## 2021-03-30 ENCOUNTER — Other Ambulatory Visit: Payer: Self-pay

## 2021-03-30 ENCOUNTER — Emergency Department (HOSPITAL_COMMUNITY)
Admission: EM | Admit: 2021-03-30 | Discharge: 2021-03-31 | Disposition: A | Discharge status: Home / Self Care | Payer: Medicare Other | Attending: Emergency Medicine | Admitting: Emergency Medicine

## 2021-03-30 ENCOUNTER — Emergency Department (HOSPITAL_COMMUNITY): Payer: Medicare Other

## 2021-03-30 DIAGNOSIS — Z85038 Personal history of other malignant neoplasm of large intestine: Secondary | ICD-10-CM | POA: Insufficient documentation

## 2021-03-30 DIAGNOSIS — E039 Hypothyroidism, unspecified: Secondary | ICD-10-CM | POA: Diagnosis not present

## 2021-03-30 DIAGNOSIS — R509 Fever, unspecified: Secondary | ICD-10-CM

## 2021-03-30 DIAGNOSIS — E1169 Type 2 diabetes mellitus with other specified complication: Secondary | ICD-10-CM | POA: Diagnosis not present

## 2021-03-30 DIAGNOSIS — Z853 Personal history of malignant neoplasm of breast: Secondary | ICD-10-CM | POA: Insufficient documentation

## 2021-03-30 DIAGNOSIS — J111 Influenza due to unidentified influenza virus with other respiratory manifestations: Secondary | ICD-10-CM | POA: Diagnosis not present

## 2021-03-30 DIAGNOSIS — R059 Cough, unspecified: Secondary | ICD-10-CM | POA: Diagnosis not present

## 2021-03-30 DIAGNOSIS — J45909 Unspecified asthma, uncomplicated: Secondary | ICD-10-CM | POA: Insufficient documentation

## 2021-03-30 DIAGNOSIS — E785 Hyperlipidemia, unspecified: Secondary | ICD-10-CM | POA: Insufficient documentation

## 2021-03-30 DIAGNOSIS — R052 Subacute cough: Secondary | ICD-10-CM

## 2021-03-30 DIAGNOSIS — N3090 Cystitis, unspecified without hematuria: Secondary | ICD-10-CM | POA: Diagnosis not present

## 2021-03-30 DIAGNOSIS — N3001 Acute cystitis with hematuria: Secondary | ICD-10-CM | POA: Diagnosis not present

## 2021-03-30 DIAGNOSIS — Z96641 Presence of right artificial hip joint: Secondary | ICD-10-CM | POA: Diagnosis not present

## 2021-03-30 DIAGNOSIS — I10 Essential (primary) hypertension: Secondary | ICD-10-CM | POA: Diagnosis not present

## 2021-03-30 DIAGNOSIS — Z85820 Personal history of malignant melanoma of skin: Secondary | ICD-10-CM | POA: Diagnosis not present

## 2021-03-30 DIAGNOSIS — Z794 Long term (current) use of insulin: Secondary | ICD-10-CM | POA: Diagnosis not present

## 2021-03-30 DIAGNOSIS — Z20822 Contact with and (suspected) exposure to covid-19: Secondary | ICD-10-CM | POA: Insufficient documentation

## 2021-03-30 DIAGNOSIS — R0902 Hypoxemia: Secondary | ICD-10-CM | POA: Diagnosis not present

## 2021-03-30 DIAGNOSIS — Z79899 Other long term (current) drug therapy: Secondary | ICD-10-CM | POA: Diagnosis not present

## 2021-03-30 DIAGNOSIS — R051 Acute cough: Secondary | ICD-10-CM | POA: Diagnosis not present

## 2021-03-30 DIAGNOSIS — R531 Weakness: Secondary | ICD-10-CM | POA: Diagnosis not present

## 2021-03-30 DIAGNOSIS — I700 Atherosclerosis of aorta: Secondary | ICD-10-CM | POA: Diagnosis not present

## 2021-03-30 MED ORDER — ACETAMINOPHEN 325 MG PO TABS: 650.0000 mg | ORAL_TABLET | Freq: Once | ORAL | Status: DC

## 2021-03-30 MED ORDER — BENZONATATE 100 MG PO CAPS
100.0000 mg | ORAL_CAPSULE | Freq: Once | ORAL | Status: AC
  Administered 2021-03-31: 100 mg via ORAL
  Filled 2021-03-30: qty 1

## 2021-03-30 MED ORDER — SODIUM CHLORIDE 0.9 % IV BOLUS
500.0000 mL | Freq: Once | INTRAVENOUS | Status: AC
  Administered 2021-03-30: 23:00:00 500 mL via INTRAVENOUS

## 2021-03-30 NOTE — ED Provider Notes (Addendum)
[facility] DEPT Provider Note   CSN: [id number] Arrival date & time: 03/30/21  2153     History Chief Complaint  Patient presents with   Cough   Generalized Body Aches    [name] is a 87 y.o. female with significant medical history.  Patient presents to ED due to body aches, chills, ongoing cough.  Patient states that at the beginning of the month she was diagnosed with flu.  Patient states that she had 3 weeks of flulike symptoms, and then stated that her symptoms were seeming to resolve.  Patient states that she went to her foot doctor and was left in a very cold room for about an hour on Tuesday and that after this her symptoms returned. Patient also adds that for the last 1 week she has been urinating on herself in her sleep, which is a new problem for her. Patient endorses fever up to 99 degrees for 2 days, nausea, enuresis, headaches, lack of appetite, body aches, chills, congestion, sinus pressure, dry cough, shortness of breath and abdominal pain.  Patient denies hemoptysis, vomiting, diarrhea, chest pain, syncopal episodes, lightheadedness.  The history is provided by the patient.  Cough Associated symptoms: chills, fever, headaches, shortness of breath and sore throat   Associated symptoms: no chest pain       Past Medical History:  Diagnosis Date   Anemia    Anxiety    Asthma    Breast cancer ([place]) 2014   right breast   Cancer of right breast ([place]) 12/26/12   right breast 12:00 o'clock, DCIS   Carotid artery disease (HCC)    Carpal tunnel syndrome, bilateral    Chronic bronchitis (HCC)    Chronic cough    Chronic facial pain    Chronic foot pain    Colon cancer ([place]) dx'd 0/6301   Complication of anesthesia    Sore jaw; could not chew or move mouth, prolonged sedation   Convulsions/seizures ([place]) 10/16/2014   Diabetes mellitus    type 2 niddm x 20 years   Dyslipidemia    Ejection fraction    Gait abnormality 12/04/2019   GERD  (gastroesophageal reflux disease)    Hammer toe    bilateral   History of colonic polyps    History of meningioma    HTN (hypertension)    Hx of radiation therapy 03/07/13- 03/29/13   right breast 4250 cGy 17 sessions   Hyperlipidemia    Hypokalemia    Hyponatremia    Hypothyroidism    IBS (irritable bowel syndrome)    Melanoma ([place])    Metatarsal bone fracture 2014   Multiple drug allergies    Nontoxic thyroid nodule    Obesity    Osteoarthritis    Osteoporosis    Palpitations    Personal history of radiation therapy 2014   Seasonal allergies    Skin cancer    Syncope    Tremor, essential 08/18/2016   Vitamin B12 deficiency    Vitamin D deficiency     Patient Active Problem List   Diagnosis Date Noted   Compression fracture of body of thoracic vertebra ([place]) 08/21/2020   Dilantin toxicity 08/17/2020   Postoperative anemia due to acute blood loss 06/06/2020   Leukocytosis    Closed right hip fracture ([place]) 06/04/2020   Constipation 05/27/2020   Dyslipidemia 05/27/2020   Enterocolitis due to Clostridium difficile, not specified as recurrent 05/27/2020   Pleural effusion on right 04/01/2020   Acute respiratory  failure with hypoxia ([place]) 04/01/2020   Pneumonia of right lung due to infectious organism 04/01/2020   Uncontrolled type 2 diabetes mellitus with hyperglycemia, with long-term current use of insulin ([place]) 04/01/2020   Abnormal CT of the chest 04/01/2020   Subacute pulmonary embolism ([place]) 17/49/4496   Acute metabolic encephalopathy 75/91/6384   Adenocarcinoma of colon ([place]) 04/01/2020   Nausea & vomiting 03/12/2020   Cancer of ascending colon pT3pN0 (0/12 LN) s/p lap right colectomy 03/06/2020 03/09/2020   History of multiple allergies 03/09/2020   S/P right hemicolectomy 03/06/2020   Closed fracture of shaft of tibia 02/12/2020   Abnormal feces 12/11/2019   Gait abnormality 12/04/2019   Impaired left ventricular function 09/05/2019   Pain in joint of left  shoulder 02/27/2019   Hypokalemia    Effusion of left knee joint    Hyponatremia    Left patella fracture 10/26/2018   Fracture of patella 10/26/2018   Pain in left knee 09/29/2018   Acquired hypothyroidism 08/25/2017   H/O excision of tumor of brain meninges 08/25/2017   Hammer toe 08/25/2017   History of breast cancer 08/25/2017   Nontoxic thyroid nodule 08/25/2017   Osteopetrosis 08/25/2017   Dysuria 01/19/2017   Vitamin D deficiency 11/18/2016   Tremor, essential 08/18/2016   Seizure disorder ([place]) 10/16/2014   Brain tumor (benign) ([place]) 10/18/2013   Subdural hemorrhage ([place]) 10/09/2013   Anxiety 10/09/2013   Compression fracture of T12 vertebra ([place]) 10/09/2013   Nontoxic multinodular goiter 09/05/2013   Syncope 08/27/2013   Carotid artery disease ([place]) 08/27/2013   Abnormal thyroid ultrasound 08/27/2013   Cancer of central portion of female breast ([place]) 12/30/2012   Osteoporosis 03/06/2010   ASTHMA 03/05/2009   Asthma 03/05/2009   IRRITABLE BOWEL SYNDROME  diarrhea type 03/21/2008   ANEMIA-NOS 12/16/2006   GERD 12/16/2006   Insulin-requiring or dependent type II diabetes mellitus ([place]) 10/29/2006   Mixed hyperlipidemia 10/29/2006   Essential hypertension 10/29/2006   Allergic rhinitis, cause unspecified 10/29/2006   OSTEOARTHRITIS 10/29/2006    Past Surgical History:  Procedure Laterality Date   ABDOMINAL HYSTERECTOMY     BIOPSY  11/07/2019   Procedure: BIOPSY;  Surgeon: Ronald Lobo, MD;  Location: [facility];  Service: Endoscopy;;   BRAIN SURGERY     BREAST BIOPSY Right 01/24/2013   Procedure: RE-EXCICION OF BREAST CANCER, ANTERIOR MARGINS;  Surgeon: Edward Jolly, MD;  Location: [facility];  Service: General;  Laterality: Right;   BREAST LUMPECTOMY Right 2014   BREAST LUMPECTOMY WITH NEEDLE LOCALIZATION Right 01/17/2013   Procedure: BREAST LUMPECTOMY WITH NEEDLE LOCALIZATION;  Surgeon: Edward Jolly, MD;  Location: [facility];  Service: General;   Laterality: Right;   BUNIONECTOMY Bilateral    CATARACT EXTRACTION W/ INTRAOCULAR LENS IMPLANT Right    CHOLECYSTECTOMY     COLONOSCOPY     COLONOSCOPY WITH PROPOFOL N/A 11/07/2019   Procedure: COLONOSCOPY WITH PROPOFOL;  Surgeon: Ronald Lobo, MD;  Location: [facility];  Service: Endoscopy;  Laterality: N/A;   CRANIOTOMY Right 10/18/2013   Procedure: CRANIOTOMY TUMOR EXCISION;  Surgeon: Floyce Stakes, MD;  [facility]: [facility];  Service: Neurosurgery;  Laterality: Right;   ESOPHAGOGASTRODUODENOSCOPY (EGD) WITH PROPOFOL N/A 11/07/2019   Procedure: ESOPHAGOGASTRODUODENOSCOPY (EGD) WITH PROPOFOL;  Surgeon: Ronald Lobo, MD;  Location: [facility];  Service: Endoscopy;  Laterality: N/A;   EYE SURGERY     HERNIA REPAIR     IR THORACENTESIS ASP PLEURAL SPACE W/IMG GUIDE  04/01/2020   KNEE ARTHROSCOPY Bilateral  LAPAROSCOPIC RIGHT HEMI COLECTOMY Right 03/06/2020   Procedure: LAPAROSCOPIC RIGHT HEMI COLECTOMY WITH TAP BLOCK AND LYSIS OF ADHESIONS;  Surgeon: Ileana Roup, MD;  Location: [facility];  Service: General;  Laterality: Right;   POLYPECTOMY     small adenomatous   TOTAL HIP ARTHROPLASTY Right 06/05/2020   Procedure: ARTHROPLASTY  ANTERIOR APPROACH. RIGHT HIP;  Surgeon: Rod Can, MD;  Location: [facility];  Service: Orthopedics;  Laterality: Right;   ULNAR TUNNEL RELEASE       OB History   No obstetric history on file.     Family History  Problem Relation Age of Onset   Heart disease Mother    Osteoporosis Mother    Diabetes Father    Pancreatic cancer Father    Bone cancer Sister    Rectal cancer Sister    Thyroid disease Sister        benign goiter resected   Prostate cancer Brother    Colon cancer Brother    Colon cancer Other     Social History   Tobacco Use   Smoking status: Never   Smokeless tobacco: Never  Vaping Use   Vaping Use: Never used  Substance Use Topics   Alcohol use: No   Drug use: No    Home Medications Prior to Admission  medications   Medication Sig Start Date End Date Taking? Authorizing Provider  acetaminophen (TYLENOL) 500 MG tablet Take 2 tablets (1,000 mg total) by mouth 3 (three) times daily. Patient taking differently: Take 1,000 mg by mouth every 8 (eight) hours as needed for moderate pain. 08/21/20  Yes Geradine Girt, DO  ALPRAZolam (XANAX) 0.25 MG tablet Take 1 tablet (0.25 mg total) by mouth 3 (three) times daily as needed for anxiety. 01/01/21  Yes Kathrynn Ducking, MD  benzonatate (TESSALON) 200 MG capsule Take 1 capsule (200 mg total) by mouth 2 (two) times daily as needed for cough. 08/21/20  Yes Geradine Girt, DO  Cholecalciferol (VITAMIN D-3) 125 MCG (5000 UT) TABS Take 5,000 Units by mouth daily.   Yes [provider]  cholestyramine light (PREVALITE) 4 GM/DOSE powder Take 1 g by mouth See admin instructions. Mix 1 packet with water or non carbonated drink and drink by mouth twice daily at least 2 hours away from other medicines   Yes [provider]  diclofenac Sodium (VOLTAREN) 1 % GEL Apply 2 g topically 4 (four) times daily as needed (pain). knees 06/01/19  Yes [provider]  feeding supplement, GLUCERNA SHAKE, (GLUCERNA SHAKE) LIQD Take 237 mLs by mouth daily as needed (for poor appetite/dinner).   Yes [provider]  insulin aspart (NOVOLOG) 100 UNIT/ML FlexPen Inject 2-12 Units into the skin See admin instructions. Injects 2-10 units of insulin under the skin 3 times a day per sliding scale: CBG 0-150: 2 units; CBG 151-200: 4 units; CBG 201-250: 6 units; CBG 251-300: 8 units; CBG 301-350: 10 units; CBG 351-400: 12 units; CBG >400: 12 units and notify the MD/NP 07/08/20  Yes Fargo, Amy E, NP  insulin glargine (LANTUS SOLOSTAR) 100 UNIT/ML Solostar Pen Inject 10 Units into the skin at bedtime. Patient taking differently: Inject 12 Units into the skin at bedtime. 08/21/20  Yes Geradine Girt, DO  levothyroxine (SYNTHROID) 75 MCG tablet Take 1 tablet (75 mcg  total) by mouth daily before breakfast. 07/08/20  Yes Fargo, Amy E, NP  lidocaine (LIDODERM) 5 % Place 1 patch onto the skin daily. Remove & Discard patch within  12 hours or as directed by MD Patient taking differently: Place 1 patch onto the skin daily as needed (for pain). Remove & Discard patch within 12 hours or as directed by MD 07/08/20  Yes Fargo, Amy E, NP  nitrofurantoin, macrocrystal-monohydrate, (MACROBID) 100 MG capsule Take 1 capsule (100 mg total) by mouth 2 (two) times daily. 03/31/21  Yes Azucena Cecil, PA  phenytoin (DILANTIN) 200 MG ER capsule Take 1 capsule (200 mg total) by mouth at bedtime. 01/01/21  Yes Kathrynn Ducking, MD  Polyethyl Glycol-Propyl Glycol (SYSTANE) 0.4-0.3 % SOLN Place 2 drops into both eyes every 12 (twelve) hours as needed (dry eyes).   Yes [provider]  primidone (MYSOLINE) 250 MG tablet Take 1 tablet (250 mg total) by mouth at bedtime. 03/10/21  Yes Penumalli, Earlean Polka, MD  Verapamil HCl CR 200 MG CP24 Take 200 mg by mouth daily. 10/19/20  Yes [provider]  Blood Glucose Monitoring Suppl (PRODIGY VOICE BLOOD GLUCOSE) w/Device KIT Use to check blood sugar 1 time per day. 10/18/15   Renato Shin, MD  cholestyramine Lucrezia Starch) 4 g packet Take 4 g by mouth 2 (two) times daily.    [provider]  glucose blood (GLUCOSE METER TEST) test strip See admin instructions.    [provider]  Glucose Blood (PRODIGY VOICE BLOOD GLUCOSE VI)     [provider]  Insulin Pen Needle 32G X 4 MM MISC Used to inject insulin 3x daily 11/19/16   Renato Shin, MD  primidone (MYSOLINE) 50 MG tablet Take 1 tablet (50 mg total) by mouth every morning. Patient taking differently: Take 100 mg by mouth every morning. 12/30/20   Kathrynn Ducking, MD    Allergies    Bystolic [nebivolol hcl], Carbamazepine, Cholestyramine, Morphine, Niaspan [niacin er], Norvasc [amlodipine besylate], Optivar [azelastine hcl], Repaglinide, Sular  [nisoldipine er], Telmisartan, Amoxicillin-pot clavulanate, Cefdinir, Ciprofloxacin hcl, Clonidine, Clonidine hydrochloride, Codeine, Ezetimibe, Hydroxychloroquine, Naproxen, Sulfa antibiotics, Ziac [bisoprolol-hydrochlorothiazide], Amlodipine besy-benazepril hcl, Azelastine, Elavil [amitriptyline], Empagliflozin, Hydralazine, Iodine i 131 tositumomab, Kenalog [triamcinolone], Keppra [levetiracetam], Lamotrigine, Pregabalin, Pseudoephedrine, Pseudoephedrine-guaifenesin er, Risedronate, Ru-hist d [brompheniramine-phenylephrine], Sumatriptan, Topiramate, Ace inhibitors, Actonel [risedronate sodium], Aspirin, Atacand [candesartan], Bextra [valdecoxib], Bisoprolol-hydrochlorothiazide, Cefadroxil, Celecoxib, Hydrocodone, Hydrocodone-acetaminophen, Iodinated diagnostic agents, Meloxicam, Methylprednisolone sodium succinate, Nabumetone, Penicillins, Pseudoephedrine-guaifenesin, Risedronate sodium, Rofecoxib, Ru-tuss [chlorphen-pse-atrop-hyos-scop], Sulfonamide derivatives, Sulphur [elemental sulfur], Telithromycin, Terfenadine, Trandolapril-verapamil hcl er, Trandolapril-verapamil hcl er, and Valium [diazepam]  Review of Systems   Review of Systems  Constitutional:  Positive for appetite change, chills and fever.  HENT:  Positive for congestion, sinus pressure and sore throat.   Respiratory:  Positive for cough and shortness of breath.   Cardiovascular:  Negative for chest pain and leg swelling.  Gastrointestinal:  Positive for abdominal pain and nausea. Negative for diarrhea and vomiting.  Genitourinary:  Positive for enuresis. Negative for difficulty urinating and dysuria.  Musculoskeletal:  Negative for back pain.  Neurological:  Positive for headaches. Negative for dizziness and light-headedness.  All other systems reviewed and are negative.  Physical Exam Updated Vital Signs BP 124/67   Pulse 70   Temp 100.3 F (37.9 C) (Oral)   Resp 15   SpO2 98%   Physical Exam Constitutional:      General:  She is not in acute distress.    Appearance: She is not toxic-appearing.  HENT:     Head: Normocephalic.     Nose: Nose normal.     Mouth/Throat:     Mouth: Mucous membranes are moist.  Eyes:  Extraocular Movements: Extraocular movements intact.     Pupils: Pupils are equal, round, and reactive to light.  Cardiovascular:     Rate and Rhythm: Normal rate and regular rhythm.  Pulmonary:     Effort: Pulmonary effort is normal.     Breath sounds: Normal breath sounds.  Abdominal:     General: Abdomen is flat.     Tenderness: There is abdominal tenderness in the right upper quadrant.  Musculoskeletal:     Cervical back: Normal range of motion.  Skin:    General: Skin is warm and dry.     Capillary Refill: Capillary refill takes less than 2 seconds.  Neurological:     General: No focal deficit present.     Mental Status: She is alert.  Psychiatric:        Mood and Affect: Mood normal.    ED Results / Procedures / Treatments   Labs (all labs ordered are listed, but only abnormal results are displayed) Labs Reviewed  CBC WITH DIFFERENTIAL/PLATELET - Abnormal; Notable for the following components:      Result Value   RBC 3.59 (*)    Hemoglobin 11.2 (*)    HCT 33.1 (*)    All other components within normal limits  COMPREHENSIVE METABOLIC PANEL - Abnormal; Notable for the following components:   Sodium 130 (*)    CO2 18 (*)    Glucose, Bld 229 (*)    Calcium 8.3 (*)    AST 14 (*)    All other components within normal limits  URINALYSIS, ROUTINE W REFLEX MICROSCOPIC - Abnormal; Notable for the following components:   Hgb urine dipstick SMALL (*)    Nitrite POSITIVE (*)    Leukocytes,Ua LARGE (*)    Bacteria, UA RARE (*)    All other components within normal limits  RESP PANEL BY RT-PCR (FLU A&B, COVID) ARPGX2  URINE CULTURE    EKG None  Radiology DG Chest 2 View  Result Date: 03/30/2021 CLINICAL DATA:  Tested positive for flu EXAM: CHEST - 2 VIEW COMPARISON:   Chest x-ray 08/19/2020. chest x-ray 06/04/2020, CT angio chest 04/01/2020, chest x-ray 10/14/2013 FINDINGS: The heart and mediastinal contours are unchanged. Aortic calcification. En face right hilar vasculature measuring 2.1 cm. No focal consolidation. No pulmonary edema. No pleural effusion. No pneumothorax. No acute osseous abnormality. IMPRESSION: No active cardiopulmonary disease. Electronically Signed   By: Iven Finn M.D.   On: 03/30/2021 23:36    Procedures Procedures   Medications Ordered in ED Medications  sodium chloride 0.9 % bolus 500 mL (0 mLs Intravenous Stopped 03/31/21 0244)  benzonatate (TESSALON) capsule 100 mg (100 mg Oral Given 03/31/21 0013)  traMADol (ULTRAM) tablet 50 mg (50 mg Oral Given 03/31/21 0016)  HYDROcodone bit-homatropine (HYCODAN) 5-1.5 MG/5ML syrup 5 mL (5 mLs Oral Given 03/31/21 0256)  lidocaine (XYLOCAINE) 2 % viscous mouth solution 15 mL (15 mLs Mouth/Throat Given 03/31/21 0509)  acetaminophen (TYLENOL) tablet 500 mg (500 mg Oral Given 03/31/21 0507)    ED Course  I have reviewed the triage vital signs and the nursing notes.  Pertinent labs & imaging results that were available during my care of the patient were reviewed by me and considered in my medical decision making (see chart for details).  Clinical Course as of 03/31/21 2010  Sun Mar 30, 2021  2345 DG Chest 2 View [CG]  2345 DG Chest 2 View [CG]    Clinical Course User Index [CG] Azucena Cecil, [place]  MDM Rules/Calculators/A&P                          87 year old female presents for 3 weeks of ongoing flulike symptoms.  Patient diagnosed with flu at the beginning of the month and states her symptoms were seeming to resolve when they suddenly returned.  Patient endorses a body ache, fevers, chills, nonproductive cough.  On exam patient is afebrile, nontoxic-appearing, nonhypoxic with oxygen saturation of 98% on room air and has clear breath sounds to auscultation  bilaterally.  Currently differential diagnosis includes pneumonia, COVID, post viral bronchitis, UTI (due to new onset enuresis)  Patient will be given 1x dose of tessalon and tramadol for cough and pain.   Lab work that we ordered at this time includes respiratory panel, CMP, CBC, chest x-ray, UA  Chest x-ray is negative for any signs of consolidation or evidence of pneumonia.  CBC results with WBC WNL. Patient does have decreased hemoglobin but this is in line with patients baseline per chart review.   Respiratory panel is negative for COVID and flu  CMP does not have any significant results  UA is significant for nitrites and leukocytes.    Based on history and UA results, this patient most likely has UTI.  This patient will be prescribed nitrofurantoin 100 mg twice a day for 5 days. I will culture this patients urine prior to discharge. I have also discussed with this patient and her sister who is at bedside the need to follow up with her PCP in the next 5 days in order for them to recheck her urine and ensure that there is no progression of symptoms. I have given the patient strict return precautions including nausea, vomiting, fevers or increased flank pain and advised her if these occur to present back to the ED. Patient is agreeable to this plan and understanding of these return precautions.  Patient has been given return guidelines and is agreeable to the plan for outpatient treatment of her UTI and understands the need to follow up with her PCP in five days to ensure that her symptoms are subsiding.  Patient is stable on discharge.   Final Clinical Impression(s) / ED Diagnoses Final diagnoses:  Subacute cough  Cystitis    Rx / DC Orders ED Discharge Orders          Ordered    nitrofurantoin, macrocrystal-monohydrate, (MACROBID) 100 MG capsule  2 times daily        03/31/21 0421             Azucena Cecil, PA-C 03/31/21 0608    Azucena Cecil,  PA 03/31/21 Pecola Lawless    Merrily Pew, MD 03/31/21 0647    Azucena Cecil, PA 03/31/21 0653    Azucena Cecil, PA 03/31/21 0654    Azucena Cecil, PA 03/31/21 7915    Merrily Pew, MD 03/31/21 [phone]

## 2021-03-30 NOTE — ED Triage Notes (Signed)
Pt reports that at the beginning of the month, she tested positive for flu, got better for a couple weeks then earlier this week, she started experiencing body aches, productive cough, chills, and fever. She was orthostatic en route. Systolic 225 laying flat and 90 when sitting up. She has 1g of tylenol en route for a fever of 100 on EMS arrival. A&Ox4.

## 2021-03-31 DIAGNOSIS — E860 Dehydration: Secondary | ICD-10-CM | POA: Diagnosis not present

## 2021-03-31 DIAGNOSIS — J180 Bronchopneumonia, unspecified organism: Secondary | ICD-10-CM | POA: Diagnosis not present

## 2021-03-31 DIAGNOSIS — Z20822 Contact with and (suspected) exposure to covid-19: Secondary | ICD-10-CM | POA: Diagnosis not present

## 2021-03-31 DIAGNOSIS — E871 Hypo-osmolality and hyponatremia: Secondary | ICD-10-CM | POA: Diagnosis not present

## 2021-03-31 DIAGNOSIS — R918 Other nonspecific abnormal finding of lung field: Secondary | ICD-10-CM | POA: Diagnosis not present

## 2021-03-31 DIAGNOSIS — Z743 Need for continuous supervision: Secondary | ICD-10-CM | POA: Diagnosis not present

## 2021-03-31 DIAGNOSIS — E876 Hypokalemia: Secondary | ICD-10-CM | POA: Diagnosis not present

## 2021-03-31 DIAGNOSIS — E440 Moderate protein-calorie malnutrition: Secondary | ICD-10-CM | POA: Diagnosis not present

## 2021-03-31 DIAGNOSIS — R531 Weakness: Secondary | ICD-10-CM | POA: Diagnosis not present

## 2021-03-31 DIAGNOSIS — E872 Acidosis, unspecified: Secondary | ICD-10-CM | POA: Diagnosis not present

## 2021-03-31 DIAGNOSIS — R059 Cough, unspecified: Secondary | ICD-10-CM | POA: Diagnosis not present

## 2021-03-31 LAB — CBC WITH DIFFERENTIAL/PLATELET
Abs Immature Granulocytes: 0.04 10*3/uL (ref 0.00–0.07)
Basophils Absolute: 0 10*3/uL (ref 0.0–0.1)
Basophils Relative: 0 %
Eosinophils Absolute: 0.1 10*3/uL (ref 0.0–0.5)
Eosinophils Relative: 1 %
HCT: 33.1 % — ABNORMAL LOW (ref 36.0–46.0)
Hemoglobin: 11.2 g/dL — ABNORMAL LOW (ref 12.0–15.0)
Immature Granulocytes: 1 %
Lymphocytes Relative: 10 %
Lymphs Abs: 0.8 10*3/uL (ref 0.7–4.0)
MCH: 31.2 pg (ref 26.0–34.0)
MCHC: 33.8 g/dL (ref 30.0–36.0)
MCV: 92.2 fL (ref 80.0–100.0)
Monocytes Absolute: 0.7 10*3/uL (ref 0.1–1.0)
Monocytes Relative: 8 %
Neutro Abs: 6.9 10*3/uL (ref 1.7–7.7)
Neutrophils Relative %: 80 %
Platelets: 238 10*3/uL (ref 150–400)
RBC: 3.59 MIL/uL — ABNORMAL LOW (ref 3.87–5.11)
RDW: 14 % (ref 11.5–15.5)
WBC: 8.5 10*3/uL (ref 4.0–10.5)
nRBC: 0 % (ref 0.0–0.2)

## 2021-03-31 LAB — URINALYSIS, ROUTINE W REFLEX MICROSCOPIC
Bilirubin Urine: NEGATIVE
Glucose, UA: NEGATIVE mg/dL
Ketones, ur: NEGATIVE mg/dL
Nitrite: POSITIVE — AB
Protein, ur: NEGATIVE mg/dL
Specific Gravity, Urine: 1.013 (ref 1.005–1.030)
pH: 5 (ref 5.0–8.0)

## 2021-03-31 LAB — COMPREHENSIVE METABOLIC PANEL
ALT: 14 U/L (ref 0–44)
AST: 14 U/L — ABNORMAL LOW (ref 15–41)
Albumin: 3.6 g/dL (ref 3.5–5.0)
Alkaline Phosphatase: 76 U/L (ref 38–126)
Anion gap: 8 (ref 5–15)
BUN: 9 mg/dL (ref 8–23)
CO2: 18 mmol/L — ABNORMAL LOW (ref 22–32)
Calcium: 8.3 mg/dL — ABNORMAL LOW (ref 8.9–10.3)
Chloride: 104 mmol/L (ref 98–111)
Creatinine, Ser: 0.68 mg/dL (ref 0.44–1.00)
GFR, Estimated: >60 mL/min (ref >60)
Glucose, Bld: 229 mg/dL — ABNORMAL HIGH (ref 70–99)
Potassium: 3.7 mmol/L (ref 3.5–5.1)
Sodium: 130 mmol/L — ABNORMAL LOW (ref 135–145)
Total Bilirubin: 0.5 mg/dL (ref 0.3–1.2)
Total Protein: 6.5 g/dL (ref 6.5–8.1)

## 2021-03-31 LAB — RESP PANEL BY RT-PCR (FLU A&B, COVID) ARPGX2
Influenza A by PCR: NEGATIVE
Influenza B by PCR: NEGATIVE
SARS Coronavirus 2 by RT PCR: NEGATIVE

## 2021-03-31 MED ORDER — TRAMADOL HCL 50 MG PO TABS
50.0000 mg | ORAL_TABLET | Freq: Once | ORAL | Status: AC
  Administered 2021-03-31: 50 mg via ORAL
  Filled 2021-03-31: qty 1

## 2021-03-31 MED ORDER — NITROFURANTOIN MONOHYD MACRO 100 MG PO CAPS: 100.0000 mg | ORAL_CAPSULE | Freq: Two times a day (BID) | ORAL | 0 refills | Status: DC

## 2021-03-31 MED ORDER — LIDOCAINE VISCOUS HCL 2 % MT SOLN
15.0000 mL | Freq: Once | OROMUCOSAL | Status: AC
  Administered 2021-03-31: 05:00:00 15 mL via OROMUCOSAL
  Filled 2021-03-31: qty 15

## 2021-03-31 MED ORDER — ACETAMINOPHEN 500 MG PO TABS
500.0000 mg | ORAL_TABLET | Freq: Once | ORAL | Status: AC
  Administered 2021-03-31: 05:00:00 500 mg via ORAL
  Filled 2021-03-31: qty 1

## 2021-03-31 MED ORDER — HYDROCODONE BIT-HOMATROP MBR 5-1.5 MG/5ML PO SOLN
5.0000 mL | Freq: Once | ORAL | Status: AC
  Administered 2021-03-31: 03:00:00 5 mL via ORAL
  Filled 2021-03-31: qty 5

## 2021-03-31 NOTE — ED Notes (Signed)
Pt sister reports she cannot help pt up stairs to home. [facility] called

## 2021-03-31 NOTE — Discharge Instructions (Addendum)
Return to ED with any new or worsening symptoms such as fever, nausea, vomiting, increased flank pain, blood in urine Follow up with your PCP in 5 days for a recheck of your urine and to ensure that the infection is not progressing Take your prescribed antibiotics to completion Utilize warm liquids such as warm tea and a spoonful of honey for any continued sore throat You Touchette also treat your sore throat with ibuprofen or tylenol

## 2021-04-01 LAB — URINE CULTURE: Culture: >=100000 — AB

## 2021-04-02 ENCOUNTER — Telehealth: Payer: Self-pay | Admitting: Emergency Medicine

## 2021-04-02 NOTE — Telephone Encounter (Signed)
Post ED Visit - Positive Culture Follow-up: Successful Patient Follow-Up  Culture assessed and recommendations reviewed by:  []  Elenor Quinones, Pharm.D. [x]  Heide Guile, Pharm.D., BCPS AQ-ID []  Parks Neptune, Pharm.D., BCPS []  Alycia Rossetti, Pharm.D., BCPS []  V[place]o, Pharm.D., BCPS, AAHIVP []  Legrand Como, Pharm.D., BCPS, AAHIVP []  Salome Arnt, PharmD, BCPS []  Johnnette Gourd, PharmD, BCPS []  Hughes Better, PharmD, BCPS []  Leeroy Cha, PharmD  Positive urine culture  []  Patient discharged without antimicrobial prescription and treatment is now indicated [x]  Organism is resistant to prescribed ED discharge antimicrobial []  Patient with positive blood cultures  Changes discussed with ED provider: Suella Broad PA New antibiotic prescription still with urinary symptoms, stop macrobid, start bactrim single strength 1 tablet bid x 5 days  Called to [address]  Contacted patient, date 04/02/2021, time [place] 04/02/2021, 10:11 AM

## 2021-04-03 ENCOUNTER — Encounter (HOSPITAL_COMMUNITY): Payer: Self-pay

## 2021-04-03 ENCOUNTER — Inpatient Hospital Stay (HOSPITAL_COMMUNITY)
Admission: EM | Admit: 2021-04-03 | Discharge: 2021-04-08 | DRG: 194 | Disposition: A | Discharge status: Skilled Nursing Facility | Payer: Medicare Other | Attending: Internal Medicine | Admitting: Internal Medicine

## 2021-04-03 ENCOUNTER — Emergency Department (HOSPITAL_COMMUNITY): Payer: Medicare Other

## 2021-04-03 ENCOUNTER — Other Ambulatory Visit: Payer: Self-pay

## 2021-04-03 DIAGNOSIS — S22009D Unspecified fracture of unspecified thoracic vertebra, subsequent encounter for fracture with routine healing: Secondary | ICD-10-CM | POA: Diagnosis not present

## 2021-04-03 DIAGNOSIS — E871 Hypo-osmolality and hyponatremia: Secondary | ICD-10-CM | POA: Diagnosis present

## 2021-04-03 DIAGNOSIS — J189 Pneumonia, unspecified organism: Secondary | ICD-10-CM

## 2021-04-03 DIAGNOSIS — R41841 Cognitive communication deficit: Secondary | ICD-10-CM | POA: Diagnosis not present

## 2021-04-03 DIAGNOSIS — C182 Malignant neoplasm of ascending colon: Secondary | ICD-10-CM | POA: Diagnosis not present

## 2021-04-03 DIAGNOSIS — R918 Other nonspecific abnormal finding of lung field: Secondary | ICD-10-CM | POA: Diagnosis not present

## 2021-04-03 DIAGNOSIS — Z8262 Family history of osteoporosis: Secondary | ICD-10-CM

## 2021-04-03 DIAGNOSIS — Z794 Long term (current) use of insulin: Secondary | ICD-10-CM

## 2021-04-03 DIAGNOSIS — D62 Acute posthemorrhagic anemia: Secondary | ICD-10-CM | POA: Diagnosis not present

## 2021-04-03 DIAGNOSIS — Z1629 Resistance to other single specified antibiotic: Secondary | ICD-10-CM | POA: Diagnosis present

## 2021-04-03 DIAGNOSIS — R2689 Other abnormalities of gait and mobility: Secondary | ICD-10-CM | POA: Diagnosis not present

## 2021-04-03 DIAGNOSIS — E785 Hyperlipidemia, unspecified: Secondary | ICD-10-CM | POA: Diagnosis present

## 2021-04-03 DIAGNOSIS — R404 Transient alteration of awareness: Secondary | ICD-10-CM | POA: Diagnosis not present

## 2021-04-03 DIAGNOSIS — T420X5D Adverse effect of hydantoin derivatives, subsequent encounter: Secondary | ICD-10-CM | POA: Diagnosis not present

## 2021-04-03 DIAGNOSIS — Z882 Allergy status to sulfonamides status: Secondary | ICD-10-CM

## 2021-04-03 DIAGNOSIS — R739 Hyperglycemia, unspecified: Secondary | ICD-10-CM | POA: Diagnosis not present

## 2021-04-03 DIAGNOSIS — Z833 Family history of diabetes mellitus: Secondary | ICD-10-CM

## 2021-04-03 DIAGNOSIS — Z888 Allergy status to other drugs, medicaments and biological substances status: Secondary | ICD-10-CM | POA: Diagnosis not present

## 2021-04-03 DIAGNOSIS — G40919 Epilepsy, unspecified, intractable, without status epilepticus: Secondary | ICD-10-CM | POA: Diagnosis not present

## 2021-04-03 DIAGNOSIS — Z9181 History of falling: Secondary | ICD-10-CM | POA: Diagnosis not present

## 2021-04-03 DIAGNOSIS — E876 Hypokalemia: Secondary | ICD-10-CM | POA: Diagnosis present

## 2021-04-03 DIAGNOSIS — E872 Acidosis, unspecified: Secondary | ICD-10-CM | POA: Diagnosis present

## 2021-04-03 DIAGNOSIS — Z85038 Personal history of other malignant neoplasm of large intestine: Secondary | ICD-10-CM

## 2021-04-03 DIAGNOSIS — E1165 Type 2 diabetes mellitus with hyperglycemia: Secondary | ICD-10-CM | POA: Diagnosis present

## 2021-04-03 DIAGNOSIS — Z6822 Body mass index (BMI) 22.0-22.9, adult: Secondary | ICD-10-CM | POA: Diagnosis not present

## 2021-04-03 DIAGNOSIS — R2681 Unsteadiness on feet: Secondary | ICD-10-CM | POA: Diagnosis not present

## 2021-04-03 DIAGNOSIS — F419 Anxiety disorder, unspecified: Secondary | ICD-10-CM | POA: Diagnosis present

## 2021-04-03 DIAGNOSIS — E861 Hypovolemia: Secondary | ICD-10-CM | POA: Diagnosis present

## 2021-04-03 DIAGNOSIS — Z91041 Radiographic dye allergy status: Secondary | ICD-10-CM | POA: Diagnosis not present

## 2021-04-03 DIAGNOSIS — Z20822 Contact with and (suspected) exposure to covid-19: Secondary | ICD-10-CM | POA: Diagnosis present

## 2021-04-03 DIAGNOSIS — R059 Cough, unspecified: Secondary | ICD-10-CM | POA: Diagnosis not present

## 2021-04-03 DIAGNOSIS — Z85820 Personal history of malignant melanoma of skin: Secondary | ICD-10-CM

## 2021-04-03 DIAGNOSIS — Z853 Personal history of malignant neoplasm of breast: Secondary | ICD-10-CM

## 2021-04-03 DIAGNOSIS — E041 Nontoxic single thyroid nodule: Secondary | ICD-10-CM | POA: Diagnosis not present

## 2021-04-03 DIAGNOSIS — J180 Bronchopneumonia, unspecified organism: Secondary | ICD-10-CM | POA: Diagnosis present

## 2021-04-03 DIAGNOSIS — K219 Gastro-esophageal reflux disease without esophagitis: Secondary | ICD-10-CM | POA: Diagnosis present

## 2021-04-03 DIAGNOSIS — E039 Hypothyroidism, unspecified: Secondary | ICD-10-CM | POA: Diagnosis present

## 2021-04-03 DIAGNOSIS — Z79890 Hormone replacement therapy (postmenopausal): Secondary | ICD-10-CM

## 2021-04-03 DIAGNOSIS — E440 Moderate protein-calorie malnutrition: Secondary | ICD-10-CM | POA: Diagnosis present

## 2021-04-03 DIAGNOSIS — Z743 Need for continuous supervision: Secondary | ICD-10-CM | POA: Diagnosis not present

## 2021-04-03 DIAGNOSIS — G40909 Epilepsy, unspecified, not intractable, without status epilepticus: Secondary | ICD-10-CM | POA: Diagnosis present

## 2021-04-03 DIAGNOSIS — M6281 Muscle weakness (generalized): Secondary | ICD-10-CM | POA: Diagnosis not present

## 2021-04-03 DIAGNOSIS — Z880 Allergy status to penicillin: Secondary | ICD-10-CM

## 2021-04-03 DIAGNOSIS — M810 Age-related osteoporosis without current pathological fracture: Secondary | ICD-10-CM | POA: Diagnosis not present

## 2021-04-03 DIAGNOSIS — E860 Dehydration: Secondary | ICD-10-CM | POA: Diagnosis not present

## 2021-04-03 DIAGNOSIS — R531 Weakness: Secondary | ICD-10-CM

## 2021-04-03 DIAGNOSIS — G250 Essential tremor: Secondary | ICD-10-CM | POA: Diagnosis present

## 2021-04-03 DIAGNOSIS — Z79899 Other long term (current) drug therapy: Secondary | ICD-10-CM

## 2021-04-03 DIAGNOSIS — Z8249 Family history of ischemic heart disease and other diseases of the circulatory system: Secondary | ICD-10-CM

## 2021-04-03 DIAGNOSIS — I10 Essential (primary) hypertension: Secondary | ICD-10-CM | POA: Diagnosis present

## 2021-04-03 DIAGNOSIS — Z885 Allergy status to narcotic agent status: Secondary | ICD-10-CM | POA: Diagnosis not present

## 2021-04-03 DIAGNOSIS — D649 Anemia, unspecified: Secondary | ICD-10-CM | POA: Diagnosis not present

## 2021-04-03 DIAGNOSIS — Z96641 Presence of right artificial hip joint: Secondary | ICD-10-CM | POA: Diagnosis present

## 2021-04-03 DIAGNOSIS — Z923 Personal history of irradiation: Secondary | ICD-10-CM

## 2021-04-03 DIAGNOSIS — R55 Syncope and collapse: Secondary | ICD-10-CM | POA: Diagnosis not present

## 2021-04-03 DIAGNOSIS — R627 Adult failure to thrive: Secondary | ICD-10-CM

## 2021-04-03 LAB — CBC WITH DIFFERENTIAL/PLATELET
Abs Immature Granulocytes: 0.17 10*3/uL — ABNORMAL HIGH (ref 0.00–0.07)
Basophils Absolute: 0.1 10*3/uL (ref 0.0–0.1)
Basophils Relative: 1 %
Eosinophils Absolute: 0.2 10*3/uL (ref 0.0–0.5)
Eosinophils Relative: 1 %
HCT: 33.9 % — ABNORMAL LOW (ref 36.0–46.0)
Hemoglobin: 11.3 g/dL — ABNORMAL LOW (ref 12.0–15.0)
Immature Granulocytes: 1 %
Lymphocytes Relative: 15 %
Lymphs Abs: 1.9 10*3/uL (ref 0.7–4.0)
MCH: 31.1 pg (ref 26.0–34.0)
MCHC: 33.3 g/dL (ref 30.0–36.0)
MCV: 93.4 fL (ref 80.0–100.0)
Monocytes Absolute: 1 10*3/uL (ref 0.1–1.0)
Monocytes Relative: 7 %
Neutro Abs: 9.8 10*3/uL — ABNORMAL HIGH (ref 1.7–7.7)
Neutrophils Relative %: 75 %
Platelets: 313 10*3/uL (ref 150–400)
RBC: 3.63 MIL/uL — ABNORMAL LOW (ref 3.87–5.11)
RDW: 14 % (ref 11.5–15.5)
WBC: 13.1 10*3/uL — ABNORMAL HIGH (ref 4.0–10.5)
nRBC: 0 % (ref 0.0–0.2)

## 2021-04-03 LAB — CBG MONITORING, ED
Glucose-Capillary: 164 mg/dL — ABNORMAL HIGH (ref 70–99)
Glucose-Capillary: 138 mg/dL — ABNORMAL HIGH (ref 70–99)

## 2021-04-03 LAB — URINALYSIS, ROUTINE W REFLEX MICROSCOPIC
Bacteria, UA: NONE SEEN
Bilirubin Urine: NEGATIVE
Glucose, UA: NEGATIVE mg/dL
Hgb urine dipstick: NEGATIVE
Ketones, ur: 20 mg/dL — AB
Leukocytes,Ua: NEGATIVE
Nitrite: NEGATIVE
Protein, ur: 30 mg/dL — AB
Specific Gravity, Urine: 1.013 (ref 1.005–1.030)
pH: 5 (ref 5.0–8.0)

## 2021-04-03 LAB — RESP PANEL BY RT-PCR (FLU A&B, COVID) ARPGX2
Influenza A by PCR: NEGATIVE
Influenza B by PCR: NEGATIVE
SARS Coronavirus 2 by RT PCR: NEGATIVE

## 2021-04-03 LAB — COMPREHENSIVE METABOLIC PANEL
ALT: 15 U/L (ref 0–44)
AST: 16 U/L (ref 15–41)
Albumin: 3.4 g/dL — ABNORMAL LOW (ref 3.5–5.0)
Alkaline Phosphatase: 112 U/L (ref 38–126)
Anion gap: 8 (ref 5–15)
BUN: 9 mg/dL (ref 8–23)
CO2: 18 mmol/L — ABNORMAL LOW (ref 22–32)
Calcium: 8 mg/dL — ABNORMAL LOW (ref 8.9–10.3)
Chloride: 102 mmol/L (ref 98–111)
Creatinine, Ser: 0.86 mg/dL (ref 0.44–1.00)
GFR, Estimated: >60 mL/min (ref >60)
Glucose, Bld: 218 mg/dL — ABNORMAL HIGH (ref 70–99)
Potassium: 3.1 mmol/L — ABNORMAL LOW (ref 3.5–5.1)
Sodium: 128 mmol/L — ABNORMAL LOW (ref 135–145)
Total Bilirubin: 0.5 mg/dL (ref 0.3–1.2)
Total Protein: 6.8 g/dL (ref 6.5–8.1)

## 2021-04-03 LAB — LACTIC ACID, PLASMA
Lactic Acid, Venous: 1.6 mmol/L (ref 0.5–1.9)
Lactic Acid, Venous: 1.2 mmol/L (ref 0.5–1.9)

## 2021-04-03 LAB — HEMOGLOBIN A1C
Hgb A1c MFr Bld: 6.6 % — ABNORMAL HIGH (ref 4.8–5.6)
Mean Plasma Glucose: 142.72 mg/dL

## 2021-04-03 LAB — MAGNESIUM: Magnesium: 1.6 mg/dL — ABNORMAL LOW (ref 1.7–2.4)

## 2021-04-03 MED ORDER — ALPRAZOLAM 0.25 MG PO TABS
0.2500 mg | ORAL_TABLET | Freq: Two times a day (BID) | ORAL | Status: DC | PRN
  Administered 2021-04-04 – 2021-04-07 (×4): 0.25 mg via ORAL
  Filled 2021-04-03 (×4): qty 1

## 2021-04-03 MED ORDER — MAGNESIUM SULFATE 2 GM/50ML IV SOLN
2.0000 g | Freq: Once | INTRAVENOUS | Status: AC
  Administered 2021-04-03: 2 g via INTRAVENOUS
  Filled 2021-04-03: qty 50

## 2021-04-03 MED ORDER — POTASSIUM CHLORIDE IN NACL 40-0.9 MEQ/L-% IV SOLN
INTRAVENOUS | Status: DC
  Filled 2021-04-03 (×2): qty 1000

## 2021-04-03 MED ORDER — PRIMIDONE 250 MG PO TABS
250.0000 mg | ORAL_TABLET | Freq: Every day | ORAL | Status: DC
  Administered 2021-04-03: 250 mg via ORAL
  Filled 2021-04-03: qty 1

## 2021-04-03 MED ORDER — PROCHLORPERAZINE EDISYLATE 10 MG/2ML IJ SOLN
10.0000 mg | Freq: Four times a day (QID) | INTRAMUSCULAR | Status: DC | PRN
  Administered 2021-04-05: 10 mg via INTRAVENOUS
  Filled 2021-04-03: qty 2

## 2021-04-03 MED ORDER — ACETAMINOPHEN 325 MG PO TABS
650.0000 mg | ORAL_TABLET | Freq: Four times a day (QID) | ORAL | Status: DC | PRN
  Administered 2021-04-04 – 2021-04-07 (×6): 650 mg via ORAL
  Filled 2021-04-03 (×6): qty 2

## 2021-04-03 MED ORDER — DIPHENHYDRAMINE HCL 50 MG/ML IJ SOLN: 12.5000 mg | Freq: Four times a day (QID) | INTRAMUSCULAR | Status: DC | PRN

## 2021-04-03 MED ORDER — POLYETHYLENE GLYCOL 3350 17 G PO PACK: 17.0000 g | PACK | Freq: Every day | ORAL | Status: DC | PRN

## 2021-04-03 MED ORDER — INSULIN ASPART 100 UNIT/ML IJ SOLN
0.0000 [IU] | Freq: Three times a day (TID) | INTRAMUSCULAR | Status: DC
  Administered 2021-04-03: 1 [IU] via SUBCUTANEOUS
  Administered 2021-04-04: 2 [IU] via SUBCUTANEOUS
  Administered 2021-04-04: 3 [IU] via SUBCUTANEOUS
  Administered 2021-04-04: 1 [IU] via SUBCUTANEOUS
  Administered 2021-04-05: 2 [IU] via SUBCUTANEOUS
  Administered 2021-04-05: 3 [IU] via SUBCUTANEOUS
  Administered 2021-04-06: 19:00:00 1 [IU] via SUBCUTANEOUS
  Administered 2021-04-06: 09:00:00 2 [IU] via SUBCUTANEOUS
  Administered 2021-04-06: 13:00:00 3 [IU] via SUBCUTANEOUS
  Administered 2021-04-07: 2 [IU] via SUBCUTANEOUS
  Administered 2021-04-07: 1 [IU] via SUBCUTANEOUS
  Administered 2021-04-07 – 2021-04-08 (×3): 2 [IU] via SUBCUTANEOUS
  Filled 2021-04-03: qty 0.09

## 2021-04-03 MED ORDER — INSULIN ASPART 100 UNIT/ML IJ SOLN
0.0000 [IU] | Freq: Every day | INTRAMUSCULAR | Status: DC
  Administered 2021-04-05: 3 [IU] via SUBCUTANEOUS
  Administered 2021-04-06 – 2021-04-07 (×2): 2 [IU] via SUBCUTANEOUS
  Filled 2021-04-03: qty 0.05

## 2021-04-03 MED ORDER — VERAPAMIL HCL ER 200 MG PO CP24: 200.0000 mg | ORAL_CAPSULE | Freq: Every day | ORAL | Status: DC

## 2021-04-03 MED ORDER — POTASSIUM CHLORIDE CRYS ER 20 MEQ PO TBCR
40.0000 meq | EXTENDED_RELEASE_TABLET | Freq: Once | ORAL | Status: AC
  Administered 2021-04-03: 40 meq via ORAL
  Filled 2021-04-03: qty 2

## 2021-04-03 MED ORDER — PHENYTOIN SODIUM EXTENDED 100 MG PO CAPS: 200.0000 mg | ORAL_CAPSULE | Freq: Every day | ORAL | Status: DC

## 2021-04-03 MED ORDER — VERAPAMIL HCL ER 180 MG PO TBCR
200.0000 mg | EXTENDED_RELEASE_TABLET | Freq: Every day | ORAL | Status: DC
  Administered 2021-04-04 – 2021-04-08 (×5): 210 mg via ORAL
  Filled 2021-04-03 (×5): qty 0.5

## 2021-04-03 MED ORDER — LEVOTHYROXINE SODIUM 75 MCG PO TABS
75.0000 ug | ORAL_TABLET | Freq: Every day | ORAL | Status: DC
  Administered 2021-04-05 – 2021-04-08 (×4): 75 ug via ORAL
  Filled 2021-04-03 (×5): qty 1

## 2021-04-03 MED ORDER — SODIUM CHLORIDE 0.9 % IV BOLUS
1000.0000 mL | Freq: Once | INTRAVENOUS | Status: AC
  Administered 2021-04-03: 1000 mL via INTRAVENOUS

## 2021-04-03 MED ORDER — ENOXAPARIN SODIUM 40 MG/0.4ML IJ SOSY
40.0000 mg | PREFILLED_SYRINGE | INTRAMUSCULAR | Status: DC
  Administered 2021-04-03 – 2021-04-07 (×5): 40 mg via SUBCUTANEOUS
  Filled 2021-04-03 (×5): qty 0.4

## 2021-04-03 MED ORDER — DOXYCYCLINE HYCLATE 100 MG PO TABS
100.0000 mg | ORAL_TABLET | Freq: Once | ORAL | Status: AC
  Administered 2021-04-03: 100 mg via ORAL
  Filled 2021-04-03: qty 1

## 2021-04-03 MED ORDER — MELATONIN 3 MG PO TABS
3.0000 mg | ORAL_TABLET | Freq: Every evening | ORAL | Status: DC | PRN
  Administered 2021-04-04 – 2021-04-06 (×3): 3 mg via ORAL
  Filled 2021-04-03 (×3): qty 1

## 2021-04-03 MED ORDER — PHENYTOIN SODIUM EXTENDED 100 MG PO CAPS
200.0000 mg | ORAL_CAPSULE | Freq: Every day | ORAL | Status: DC
  Administered 2021-04-03 – 2021-04-07 (×5): 200 mg via ORAL
  Filled 2021-04-03 (×5): qty 2

## 2021-04-03 MED ORDER — PRIMIDONE 50 MG PO TABS
100.0000 mg | ORAL_TABLET | Freq: Every day | ORAL | Status: DC
  Administered 2021-04-04: 100 mg via ORAL
  Filled 2021-04-03: qty 2

## 2021-04-03 MED ORDER — SODIUM CHLORIDE 0.9 % IV SOLN
1.5000 g | Freq: Four times a day (QID) | INTRAVENOUS | Status: DC
  Administered 2021-04-03 – 2021-04-07 (×15): 1.5 g via INTRAVENOUS
  Filled 2021-04-03: qty 1.5
  Filled 2021-04-03: qty 4
  Filled 2021-04-03: qty 1.5
  Filled 2021-04-03 (×3): qty 4
  Filled 2021-04-03 (×2): qty 1.5
  Filled 2021-04-03 (×3): qty 4
  Filled 2021-04-03 (×3): qty 1.5
  Filled 2021-04-03 (×2): qty 4

## 2021-04-03 NOTE — ED Provider Notes (Signed)
[facility] DEPT Provider Note   CSN: [id number] Arrival date & time: 04/03/21  0737     History Chief Complaint  Patient presents with   Weakness    [name] is a 87 y.o. female.  Pt presents to the ED today with weakness.  Pt said she has been getting progressively weak for the last week.  She has had cough and congestion.  She was here on 11/27 and was diagnosed with a UTI.  She was given a rx for macrobid and urine culture showed resistance, so bactrim was called in. She took 1 dose last night and 1 this morning.  Pt has gotten so weak that she is unable to walk or eat on her own.  She lives with her sister who is very close to pt's own age.  Family have been coming by, but they can't lift her.  She continues to have cough and congestion.      Past Medical History:  Diagnosis Date   Anemia    Anxiety    Asthma    Breast cancer ([place]) 2014   right breast   Cancer of right breast ([place]) 12/26/12   right breast 12:00 o'clock, DCIS   Carotid artery disease (HCC)    Carpal tunnel syndrome, bilateral    Chronic bronchitis (HCC)    Chronic cough    Chronic facial pain    Chronic foot pain    Colon cancer ([place]) dx'd 05/624   Complication of anesthesia    Sore jaw; could not chew or move mouth, prolonged sedation   Convulsions/seizures ([place]) 10/16/2014   Diabetes mellitus    type 2 niddm x 20 years   Dyslipidemia    Ejection fraction    Gait abnormality 12/04/2019   GERD (gastroesophageal reflux disease)    Hammer toe    bilateral   History of colonic polyps    History of meningioma    HTN (hypertension)    Hx of radiation therapy 03/07/13- 03/29/13   right breast 4250 cGy 17 sessions   Hyperlipidemia    Hypokalemia    Hyponatremia    Hypothyroidism    IBS (irritable bowel syndrome)    Melanoma ([place])    Metatarsal bone fracture 2014   Multiple drug allergies    Nontoxic thyroid nodule    Obesity    Osteoarthritis    Osteoporosis     Palpitations    Personal history of radiation therapy 2014   Seasonal allergies    Skin cancer    Syncope    Tremor, essential 08/18/2016   Vitamin B12 deficiency    Vitamin D deficiency     Patient Active Problem List   Diagnosis Date Noted   CAP (community acquired pneumonia) 04/03/2021   Compression fracture of body of thoracic vertebra ([place]) 08/21/2020   Dilantin toxicity 08/17/2020   Postoperative anemia due to acute blood loss 06/06/2020   Leukocytosis    Closed right hip fracture ([place]) 06/04/2020   Constipation 05/27/2020   Dyslipidemia 05/27/2020   Enterocolitis due to Clostridium difficile, not specified as recurrent 05/27/2020   Pleural effusion on right 04/01/2020   Acute respiratory failure with hypoxia ([place]) 04/01/2020   Pneumonia of right lung due to infectious organism 04/01/2020   Uncontrolled type 2 diabetes mellitus with hyperglycemia, with long-term current use of insulin ([place]) 04/01/2020   Abnormal CT of the chest 04/01/2020   Subacute pulmonary embolism ([place]) 94/85/4627   Acute metabolic encephalopathy 03/50/0938   Adenocarcinoma  of colon ([place]) 04/01/2020   Nausea & vomiting 03/12/2020   Cancer of ascending colon pT3pN0 (0/12 LN) s/p lap right colectomy 03/06/2020 03/09/2020   History of multiple allergies 03/09/2020   S/P right hemicolectomy 03/06/2020   Closed fracture of shaft of tibia 02/12/2020   Abnormal feces 12/11/2019   Gait abnormality 12/04/2019   Impaired left ventricular function 09/05/2019   Pain in joint of left shoulder 02/27/2019   Hypokalemia    Effusion of left knee joint    Hyponatremia    Left patella fracture 10/26/2018   Fracture of patella 10/26/2018   Pain in left knee 09/29/2018   Acquired hypothyroidism 08/25/2017   H/O excision of tumor of brain meninges 08/25/2017   Hammer toe 08/25/2017   History of breast cancer 08/25/2017   Nontoxic thyroid nodule 08/25/2017   Osteopetrosis 08/25/2017   Dysuria 01/19/2017   Vitamin  D deficiency 11/18/2016   Tremor, essential 08/18/2016   Seizure disorder ([place]) 10/16/2014   Brain tumor (benign) ([place]) 10/18/2013   Subdural hemorrhage ([place]) 10/09/2013   Anxiety 10/09/2013   Compression fracture of T12 vertebra ([place]) 10/09/2013   Nontoxic multinodular goiter 09/05/2013   Syncope 08/27/2013   Carotid artery disease ([place]) 08/27/2013   Abnormal thyroid ultrasound 08/27/2013   Cancer of central portion of female breast ([place]) 12/30/2012   Osteoporosis 03/06/2010   ASTHMA 03/05/2009   Asthma 03/05/2009   IRRITABLE BOWEL SYNDROME  diarrhea type 03/21/2008   ANEMIA-NOS 12/16/2006   GERD 12/16/2006   Insulin-requiring or dependent type II diabetes mellitus ([place]) 10/29/2006   Mixed hyperlipidemia 10/29/2006   Essential hypertension 10/29/2006   Allergic rhinitis, cause unspecified 10/29/2006   OSTEOARTHRITIS 10/29/2006    Past Surgical History:  Procedure Laterality Date   ABDOMINAL HYSTERECTOMY     BIOPSY  11/07/2019   Procedure: BIOPSY;  Surgeon: Ronald Lobo, MD;  Location: [facility];  Service: Endoscopy;;   BRAIN SURGERY     BREAST BIOPSY Right 01/24/2013   Procedure: RE-EXCICION OF BREAST CANCER, ANTERIOR MARGINS;  Surgeon: Edward Jolly, MD;  Location: [facility];  Service: General;  Laterality: Right;   BREAST LUMPECTOMY Right 2014   BREAST LUMPECTOMY WITH NEEDLE LOCALIZATION Right 01/17/2013   Procedure: BREAST LUMPECTOMY WITH NEEDLE LOCALIZATION;  Surgeon: Edward Jolly, MD;  Location: [facility];  Service: General;  Laterality: Right;   BUNIONECTOMY Bilateral    CATARACT EXTRACTION W/ INTRAOCULAR LENS IMPLANT Right    CHOLECYSTECTOMY     COLONOSCOPY     COLONOSCOPY WITH PROPOFOL N/A 11/07/2019   Procedure: COLONOSCOPY WITH PROPOFOL;  Surgeon: Ronald Lobo, MD;  Location: [facility];  Service: Endoscopy;  Laterality: N/A;   CRANIOTOMY Right 10/18/2013   Procedure: CRANIOTOMY TUMOR EXCISION;  Surgeon: Floyce Stakes, MD;  Location: [facility];   Service: Neurosurgery;  Laterality: Right;   ESOPHAGOGASTRODUODENOSCOPY (EGD) WITH PROPOFOL N/A 11/07/2019   Procedure: ESOPHAGOGASTRODUODENOSCOPY (EGD) WITH PROPOFOL;  Surgeon: Ronald Lobo, MD;  Location: [facility];  Service: Endoscopy;  Laterality: N/A;   EYE SURGERY     HERNIA REPAIR     IR THORACENTESIS ASP PLEURAL SPACE W/IMG GUIDE  04/01/2020   KNEE ARTHROSCOPY Bilateral    LAPAROSCOPIC RIGHT HEMI COLECTOMY Right 03/06/2020   Procedure: LAPAROSCOPIC RIGHT HEMI COLECTOMY WITH TAP BLOCK AND LYSIS OF ADHESIONS;  Surgeon: Ileana Roup, MD;  Location: [facility];  Service: General;  Laterality: Right;   POLYPECTOMY     small adenomatous   TOTAL HIP ARTHROPLASTY Right 06/05/2020   Procedure: ARTHROPLASTY  ANTERIOR APPROACH. RIGHT HIP;  Surgeon: Rod Can, MD;  Location: [facility];  Service: Orthopedics;  Laterality: Right;   ULNAR TUNNEL RELEASE       OB History   No obstetric history on file.     Family History  Problem Relation Age of Onset   Heart disease Mother    Osteoporosis Mother    Diabetes Father    Pancreatic cancer Father    Bone cancer Sister    Rectal cancer Sister    Thyroid disease Sister        benign goiter resected   Prostate cancer Brother    Colon cancer Brother    Colon cancer Other     Social History   Tobacco Use   Smoking status: Never   Smokeless tobacco: Never  Vaping Use   Vaping Use: Never used  Substance Use Topics   Alcohol use: No   Drug use: No    Home Medications Prior to Admission medications   Medication Sig Start Date End Date Taking? Authorizing Provider  acetaminophen (TYLENOL) 500 MG tablet Take 2 tablets (1,000 mg total) by mouth 3 (three) times daily. Patient taking differently: Take 1,000 mg by mouth every 8 (eight) hours as needed for moderate pain. 08/21/20   Geradine Girt, DO  ALPRAZolam Duanne Moron) 0.25 MG tablet Take 1 tablet (0.25 mg total) by mouth 3 (three) times daily as needed for anxiety. 01/01/21    Kathrynn Ducking, MD  benzonatate (TESSALON) 200 MG capsule Take 1 capsule (200 mg total) by mouth 2 (two) times daily as needed for cough. 08/21/20   Geradine Girt, DO  Blood Glucose Monitoring Suppl (PRODIGY VOICE BLOOD GLUCOSE) w/Device KIT Use to check blood sugar 1 time per day. 10/18/15   Renato Shin, MD  Cholecalciferol (VITAMIN D-3) 125 MCG (5000 UT) TABS Take 5,000 Units by mouth daily.    [provider]  cholestyramine (QUESTRAN) 4 g packet Take 4 g by mouth 2 (two) times daily.    [provider]  cholestyramine light (PREVALITE) 4 GM/DOSE powder Take 1 g by mouth See admin instructions. Mix 1 packet with water or non carbonated drink and drink by mouth twice daily at least 2 hours away from other medicines    [provider]  diclofenac Sodium (VOLTAREN) 1 % GEL Apply 2 g topically 4 (four) times daily as needed (pain). knees 06/01/19   [provider]  feeding supplement, GLUCERNA SHAKE, (GLUCERNA SHAKE) LIQD Take 237 mLs by mouth daily as needed (for poor appetite/dinner).    [provider]  glucose blood (GLUCOSE METER TEST) test strip See admin instructions.    [provider]  Glucose Blood (PRODIGY VOICE BLOOD GLUCOSE VI)     [provider]  insulin aspart (NOVOLOG) 100 UNIT/ML FlexPen Inject 2-12 Units into the skin See admin instructions. Injects 2-10 units of insulin under the skin 3 times a day per sliding scale: CBG 0-150: 2 units; CBG 151-200: 4 units; CBG 201-250: 6 units; CBG 251-300: 8 units; CBG 301-350: 10 units; CBG 351-400: 12 units; CBG >400: 12 units and notify the MD/NP 07/08/20   Cleophas Dunker, Amy E, NP  insulin glargine (LANTUS SOLOSTAR) 100 UNIT/ML Solostar Pen Inject 10 Units into the skin at bedtime. Patient taking differently: Inject 12 Units into the skin at bedtime. 08/21/20   Geradine Girt, DO  Insulin Pen Needle 32G X 4 MM MISC Used to inject insulin 3x daily 11/19/16   Renato Shin,  MD   levothyroxine (SYNTHROID) 75 MCG tablet Take 1 tablet (75 mcg total) by mouth daily before breakfast. 07/08/20   Fargo, Amy E, NP  lidocaine (LIDODERM) 5 % Place 1 patch onto the skin daily. Remove & Discard patch within 12 hours or as directed by MD Patient taking differently: Place 1 patch onto the skin daily as needed (for pain). Remove & Discard patch within 12 hours or as directed by MD 07/08/20   Yvonna Alanis, NP  nitrofurantoin, macrocrystal-monohydrate, (MACROBID) 100 MG capsule Take 1 capsule (100 mg total) by mouth 2 (two) times daily. 03/31/21   Azucena Cecil, PA  phenytoin (DILANTIN) 200 MG ER capsule Take 1 capsule (200 mg total) by mouth at bedtime. 01/01/21   Kathrynn Ducking, MD  Polyethyl Glycol-Propyl Glycol (SYSTANE) 0.4-0.3 % SOLN Place 2 drops into both eyes every 12 (twelve) hours as needed (dry eyes).    [provider]  primidone (MYSOLINE) 250 MG tablet Take 1 tablet (250 mg total) by mouth at bedtime. 03/10/21   Penumalli, Earlean Polka, MD  primidone (MYSOLINE) 50 MG tablet Take 1 tablet (50 mg total) by mouth every morning. Patient taking differently: Take 100 mg by mouth every morning. 12/30/20   Kathrynn Ducking, MD  Verapamil HCl CR 200 MG CP24 Take 200 mg by mouth daily. 10/19/20   [provider]    Allergies    Bystolic [nebivolol hcl], Carbamazepine, Cholestyramine, Morphine, Niaspan [niacin er], Norvasc [amlodipine besylate], Optivar [azelastine hcl], Repaglinide, Sular [nisoldipine er], Telmisartan, Amoxicillin-pot clavulanate, Cefdinir, Ciprofloxacin hcl, Clonidine, Clonidine hydrochloride, Codeine, Ezetimibe, Hydroxychloroquine, Naproxen, Sulfa antibiotics, Ziac [bisoprolol-hydrochlorothiazide], Amlodipine besy-benazepril hcl, Azelastine, Elavil [amitriptyline], Empagliflozin, Hydralazine, Iodine i 131 tositumomab, Kenalog [triamcinolone], Keppra [levetiracetam], Lamotrigine, Pregabalin, Pseudoephedrine, Pseudoephedrine-guaifenesin er, Risedronate,  Ru-hist d [brompheniramine-phenylephrine], Sumatriptan, Topiramate, Ace inhibitors, Actonel [risedronate sodium], Aspirin, Atacand [candesartan], Bextra [valdecoxib], Bisoprolol-hydrochlorothiazide, Cefadroxil, Celecoxib, Hydrocodone, Hydrocodone-acetaminophen, Iodinated diagnostic agents, Meloxicam, Methylprednisolone sodium succinate, Nabumetone, Penicillins, Pseudoephedrine-guaifenesin, Risedronate sodium, Rofecoxib, Ru-tuss [chlorphen-pse-atrop-hyos-scop], Sulfonamide derivatives, Sulphur [elemental sulfur], Telithromycin, Terfenadine, Trandolapril-verapamil hcl er, Trandolapril-verapamil hcl er, and Valium [diazepam]  Review of Systems   Review of Systems  Respiratory:  Positive for cough.   Neurological:  Positive for weakness.  All other systems reviewed and are negative.  Physical Exam Updated Vital Signs BP 137/76 (BP Location: Right Arm)   Pulse 72   Temp 97.7 F (36.5 C) (Oral)   Resp 17   Ht 5' 7.5" (1.715 m)   Wt 65.2 kg   SpO2 100%   BMI 22.19 kg/m   Physical Exam Vitals and nursing note reviewed.  Constitutional:      Appearance: Normal appearance.  HENT:     Head: Normocephalic and atraumatic.     Right Ear: External ear normal.     Left Ear: External ear normal.     Nose: Nose normal.     Mouth/Throat:     Mouth: Mucous membranes are dry.  Eyes:     Extraocular Movements: Extraocular movements intact.     Conjunctiva/sclera: Conjunctivae normal.     Pupils: Pupils are equal, round, and reactive to light.  Cardiovascular:     Rate and Rhythm: Normal rate and regular rhythm.     Pulses: Normal pulses.     Heart sounds: Normal heart sounds.  Pulmonary:     Effort: Pulmonary effort is normal.     Breath sounds: Normal breath sounds.  Abdominal:     General: Abdomen is flat. Bowel sounds are normal.     Palpations:  Abdomen is soft.  Musculoskeletal:        General: Normal range of motion.     Cervical back: Normal range of motion and neck supple.  Skin:     General: Skin is warm.     Capillary Refill: Capillary refill takes less than 2 seconds.  Neurological:     General: No focal deficit present.     Mental Status: She is alert and oriented to person, place, and time.  Psychiatric:        Mood and Affect: Mood normal.        Behavior: Behavior normal.    ED Results / Procedures / Treatments   Labs (all labs ordered are listed, but only abnormal results are displayed) Labs Reviewed  CBC WITH DIFFERENTIAL/PLATELET - Abnormal; Notable for the following components:      Result Value   WBC 13.1 (*)    RBC 3.63 (*)    Hemoglobin 11.3 (*)    HCT 33.9 (*)    Neutro Abs 9.8 (*)    Abs Immature Granulocytes 0.17 (*)    All other components within normal limits  COMPREHENSIVE METABOLIC PANEL - Abnormal; Notable for the following components:   Sodium 128 (*)    Potassium 3.1 (*)    CO2 18 (*)    Glucose, Bld 218 (*)    Calcium 8.0 (*)    Albumin 3.4 (*)    All other components within normal limits  MAGNESIUM - Abnormal; Notable for the following components:   Magnesium 1.6 (*)    All other components within normal limits  CBG MONITORING, ED - Abnormal; Notable for the following components:   Glucose-Capillary 164 (*)    All other components within normal limits  RESP PANEL BY RT-PCR (FLU A&B, COVID) ARPGX2  CULTURE, BLOOD (ROUTINE X 2)  CULTURE, BLOOD (ROUTINE X 2)  RESPIRATORY PANEL BY PCR  EXPECTORATED SPUTUM ASSESSMENT W GRAM STAIN, RFLX TO RESP C  LACTIC ACID, PLASMA  LACTIC ACID, PLASMA  URINALYSIS, ROUTINE W REFLEX MICROSCOPIC  HEMOGLOBIN A1C    EKG EKG Interpretation  Date/Time:  Thursday April 03 2021 13:59:59 EST Ventricular Rate:  73 PR Interval:  202 QRS Duration: 104 QT Interval:  446 QTC Calculation: 492 R Axis:   -38 Text Interpretation: Sinus rhythm Atrial premature complex Abnormal R-wave progression, early transition Inferior infarct, old No significant change since last tracing Confirmed by Isla Pence [phone]) on 04/03/2021 2:07:19 PM  Radiology DG Chest 2 View  Result Date: 04/03/2021 CLINICAL DATA:  Cough.  Worsening weakness. EXAM: CHEST - 2 VIEW COMPARISON:  03/30/2021 FINDINGS: Heart size is normal. Chronic aortic tortuosity. There are mild bilateral patchy pulmonary infiltrates, right more than left, consistent with bronchopneumonia. No dense consolidation or lobar collapse. Old midthoracic vertebral body compression fractures incidentally noted. IMPRESSION: Mild patchy bilateral pulmonary infiltrates consistent with pneumonia, right more than left. Electronically Signed   By: Nelson Chimes M.D.   On: 04/03/2021 10:39    Procedures Procedures   Medications Ordered in ED Medications  enoxaparin (LOVENOX) injection 40 mg (has no administration in time range)  ALPRAZolam (XANAX) tablet 0.25 mg (has no administration in time range)  levothyroxine (SYNTHROID) tablet 75 mcg (has no administration in time range)  phenytoin (DILANTIN) ER capsule 200 mg (has no administration in time range)  primidone (MYSOLINE) tablet 250 mg (has no administration in time range)  primidone (MYSOLINE) tablet 100 mg (has no administration in time range)  Verapamil HCl CR CP24 200  mg (has no administration in time range)  acetaminophen (TYLENOL) tablet 650 mg (has no administration in time range)  prochlorperazine (COMPAZINE) injection 10 mg (has no administration in time range)  melatonin tablet 3 mg (has no administration in time range)  polyethylene glycol (MIRALAX / GLYCOLAX) packet 17 g (has no administration in time range)  0.9 % NaCl with KCl 40 mEq / L  infusion (has no administration in time range)  insulin aspart (novoLOG) injection 0-9 Units (has no administration in time range)  insulin aspart (novoLOG) injection 0-5 Units (has no administration in time range)  potassium chloride SA (KLOR-CON M) CR tablet 40 mEq (has no administration in time range)  magnesium sulfate IVPB 2 g 50 mL (has no  administration in time range)  sodium chloride 0.9 % bolus 1,000 mL (1,000 mLs Intravenous New Bag/Given 04/03/21 1334)  doxycycline (VIBRA-TABS) tablet 100 mg (100 mg Oral Given 04/03/21 1337)  potassium chloride SA (KLOR-CON M) CR tablet 40 mEq (40 mEq Oral Given 04/03/21 1336)    ED Course  I have reviewed the triage vital signs and the nursing notes.  Pertinent labs & imaging results that were available during my care of the patient were reviewed by me and considered in my medical decision making (see chart for details).    MDM Rules/Calculators/A&P                           CXR shows bilateral pneumonia.  Pt has over 60 allergies to medications.  The usual treatments (rocephin, levaquin, zithromax) are all allergies.  So, she is given doxy.  Covid/flu negative.  K is low and is replaced.  Mg is low and is replaced.  Na is low and pt is given IVFs.  Urine is still pending at admission.  Pt d/w Dr. Nevada Crane ([facility]) for admission.   Final Clinical Impression(s) / ED Diagnoses Final diagnoses:  Weakness  Dehydration  Pneumonia of both lungs due to infectious organism, unspecified part of lung  Failure to thrive in adult  Hypokalemia  Hyponatremia  Hypomagnesemia    Rx / DC Orders ED Discharge Orders     None        Isla Pence, MD 04/03/21 1500

## 2021-04-03 NOTE — ED Triage Notes (Signed)
BIB EMS from home. Pt c/o increased weakness over the last week or two. Per EMS, recently hospitalized for weakness, bronchitis, and UTI. Unable to get to bathroom, eat, walk, or drink recently.

## 2021-04-03 NOTE — H&P (Signed)
History and Physical  [name] ZMO:[id number] DOB: 1930-02-15 DOA: 04/03/2021  Referring physician: Dr. Gilford Raid, EDP PCP: Bartholome Bill, MD  Outpatient Specialists: Endocrinology, podiatry, neurology. Patient coming from: Home  Chief Complaint: Worsening productive cough x 1 week, weakness   HPI: [name] is a 87 y.o. female with medical history significant for seizure disorder, gait abnormality, essential tremor, who presented to [facility] ED from home due to worsening productive cough and generalized weakness of 1 week duration.  She states she started coughing about a month ago and it has gradually been worsening.  Associated with subjective fevers and chills for the past 2 days.  She presented to the ED at [facility] on 03/30/2021 for cough and generalized body aches, was diagnosed with bronchitis and UTI.  Received symptomatic treatment of bronchitis in the ED and was discharged from the ED with prescription for Macrobid 100 mg twice daily x5 days for presumptive UTI.  Urine culture returned resistant to Macrobid, switched to Bactrim which she took yesterday and this morning.  She presents today with persistent cough and generalized weakness.  Lives alone,-difficult to take care of her activities of daily living due to her acute illness.  Work-up in the ED reveals multifocal pneumonia seen on chest x-ray for which she received a dose of doxycycline in the ED.  Also showing electrolyte abnormalities including hyponatremia, hypokalemia, hypomagnesemia, metabolic acidosis with serum bicarb of 18.  COVID-19 and respiratory viral panel negative.  TRH, hospitalist service was asked to admit.  ED Course: Afebrile, T-max 97.8.  BP 137/76, pulse 71, respiration rate 14, O2 saturation 100% on room air.  Lab studies remarkable for WBC 13.1, hemoglobin 11.3, MCV 93, platelet count 313.  Neutrophil count 9.8.  Serum sodium 128, potassium 3.1, magnesium 1.6.  Serum bicarb 18, serum glucose 215,  calcium 8.0, albumin 3.4.  Review of Systems: Review of systems as noted in the HPI. All other systems reviewed and are negative.   Past Medical History:  Diagnosis Date   Anemia    Anxiety    Asthma    Breast cancer ([place]) 2014   right breast   Cancer of right breast ([place]) 12/26/12   right breast 12:00 o'clock, DCIS   Carotid artery disease (HCC)    Carpal tunnel syndrome, bilateral    Chronic bronchitis (HCC)    Chronic cough    Chronic facial pain    Chronic foot pain    Colon cancer ([place]) dx'd 0/3546   Complication of anesthesia    Sore jaw; could not chew or move mouth, prolonged sedation   Convulsions/seizures ([place]) 10/16/2014   Diabetes mellitus    type 2 niddm x 20 years   Dyslipidemia    Ejection fraction    Gait abnormality 12/04/2019   GERD (gastroesophageal reflux disease)    Hammer toe    bilateral   History of colonic polyps    History of meningioma    HTN (hypertension)    Hx of radiation therapy 03/07/13- 03/29/13   right breast 4250 cGy 17 sessions   Hyperlipidemia    Hypokalemia    Hyponatremia    Hypothyroidism    IBS (irritable bowel syndrome)    Melanoma ([facility])    Metatarsal bone fracture 2014   Multiple drug allergies    Nontoxic thyroid nodule    Obesity    Osteoarthritis    Osteoporosis    Palpitations    Personal history of radiation therapy 2014   Seasonal  allergies    Skin cancer    Syncope    Tremor, essential 08/18/2016   Vitamin B12 deficiency    Vitamin D deficiency    Past Surgical History:  Procedure Laterality Date   ABDOMINAL HYSTERECTOMY     BIOPSY  11/07/2019   Procedure: BIOPSY;  Surgeon: Ronald Lobo, MD;  Location: [facility];  Service: Endoscopy;;   BRAIN SURGERY     BREAST BIOPSY Right 01/24/2013   Procedure: RE-EXCICION OF BREAST CANCER, ANTERIOR MARGINS;  Surgeon: Edward Jolly, MD;  Location: [facility];  Service: General;  Laterality: Right;   BREAST LUMPECTOMY Right 2014   BREAST LUMPECTOMY WITH NEEDLE  LOCALIZATION Right 01/17/2013   Procedure: BREAST LUMPECTOMY WITH NEEDLE LOCALIZATION;  Surgeon: Edward Jolly, MD;  Location: [facility];  Service: General;  Laterality: Right;   BUNIONECTOMY Bilateral    CATARACT EXTRACTION W/ INTRAOCULAR LENS IMPLANT Right    CHOLECYSTECTOMY     COLONOSCOPY     COLONOSCOPY WITH PROPOFOL N/A 11/07/2019   Procedure: COLONOSCOPY WITH PROPOFOL;  Surgeon: Ronald Lobo, MD;  Location: [facility];  Service: Endoscopy;  Laterality: N/A;   CRANIOTOMY Right 10/18/2013   Procedure: CRANIOTOMY TUMOR EXCISION;  Surgeon: Floyce Stakes, MD;  Location: [facility];  Service: Neurosurgery;  Laterality: Right;   ESOPHAGOGASTRODUODENOSCOPY (EGD) WITH PROPOFOL N/A 11/07/2019   Procedure: ESOPHAGOGASTRODUODENOSCOPY (EGD) WITH PROPOFOL;  Surgeon: Ronald Lobo, MD;  Location: [facility];  Service: Endoscopy;  Laterality: N/A;   EYE SURGERY     HERNIA REPAIR     IR THORACENTESIS ASP PLEURAL SPACE W/IMG GUIDE  04/01/2020   KNEE ARTHROSCOPY Bilateral    LAPAROSCOPIC RIGHT HEMI COLECTOMY Right 03/06/2020   Procedure: LAPAROSCOPIC RIGHT HEMI COLECTOMY WITH TAP BLOCK AND LYSIS OF ADHESIONS;  Surgeon: Ileana Roup, MD;  Location: [facility];  Service: General;  Laterality: Right;   POLYPECTOMY     small adenomatous   TOTAL HIP ARTHROPLASTY Right 06/05/2020   Procedure: ARTHROPLASTY  ANTERIOR APPROACH. RIGHT HIP;  Surgeon: Rod Can, MD;  Location: [facility];  Service: Orthopedics;  Laterality: Right;   ULNAR TUNNEL RELEASE      Social History:  reports that she has never smoked. She has never used smokeless tobacco. She reports that she does not drink alcohol and does not use drugs.   Allergies  Allergen Reactions   Bystolic [Nebivolol Hcl] Other (See Comments)    "extreme weakness, heaviness in legs & arms, swelling in legs/arms/face, swollen abdomen, pain in bladder, feet pain, soreness in chest"   Carbamazepine Other (See Comments)    Blood poisoning  Other  reaction(s): Unknown   Cholestyramine Other (See Comments)    "itching rash on stomach, bloated, nausea, vomiting, sleeplessness, extreme pain in arms"   Morphine Other (See Comments)    Feels morbid, weak, still in pain   Niaspan [Niacin Er] Palpitations and Other (See Comments)    "fast heart beat, high blood pressure"   Norvasc [Amlodipine Besylate] Other (See Comments)    "extreme fluid retention/pain)   Optivar [Azelastine Hcl] Photosensitivity   Repaglinide Hives   Sular [Nisoldipine Er] Other (See Comments)    "severe headaches, swelling eyes, hands, feet, shortness of breath, weak, flushed face, brain boiling, fluid retention, high blood sugar, nervous, heart fast beating"   Telmisartan Other (See Comments)    "headache, difficulty urinating, high blood sugar, fluid retention"   Amoxicillin-Pot Clavulanate Rash and Other (See Comments)   Cefdinir Swelling    Vaginal irritation, breathing,  Ciprofloxacin Hcl Hives   Clonidine Other (See Comments)    Dry mouth, fluid retention   Clonidine Hydrochloride Other (See Comments)    Dry mouth, fluid retention   Codeine Nausea And Vomiting   Ezetimibe Other (See Comments)    Made weak Other reaction(s): Unknown   Hydroxychloroquine Other (See Comments)    Low platlets   Naproxen Other (See Comments)    Shrinks bladder   Sulfa Antibiotics Rash   Ziac [Bisoprolol-Hydrochlorothiazide] Other (See Comments)    "stopped urination"   Amlodipine Besy-Benazepril Hcl     Other reaction(s): cough   Azelastine Other (See Comments)    Unknown reaction - Per MAR   Elavil [Amitriptyline] Other (See Comments)    Gave Pt nightmares   Empagliflozin Other (See Comments)    "Caused yeast infection, slowed my urine"   Hydralazine Other (See Comments)    "does not reduce high blood pressure, pain in arm, high pressure, felt like I was on verge of heart attack, really weak"   Iodine I 131 Tositumomab Other (See Comments)    Unknown reaction -  Per MAR   Kenalog [Triamcinolone] Diarrhea    Per MAR   Keppra [Levetiracetam] Other (See Comments)    Shaking   Lamotrigine Itching and Other (See Comments)   Pregabalin Swelling    Other reaction(s): weight gain   Pseudoephedrine    Pseudoephedrine-Guaifenesin Er Other (See Comments)    Unknown reaction - Per MAR   Risedronate     Unknown reaction - Per MAR   Ru-Hist D [Brompheniramine-Phenylephrine] Other (See Comments)    Unknown reaction - Per MAR   Sumatriptan Other (See Comments)   Topiramate Other (See Comments)    Dry eyes Other reaction(s): Unknown   Ace Inhibitors Other (See Comments)    unknown   Actonel [Risedronate Sodium] Other (See Comments)    unknown   Aspirin Other (See Comments)    unknown   Atacand [Candesartan] Other (See Comments)    Unknown reaction - MAR   Bextra [Valdecoxib] Other (See Comments)    Unknown reaction - Per MAR   Bisoprolol-Hydrochlorothiazide Other (See Comments)    Unknown reaction - Per MAR   Cefadroxil Other (See Comments)    unknown   Celecoxib Rash   Hydrocodone Other (See Comments)    unknown   Hydrocodone-Acetaminophen Other (See Comments)    unknown   Iodinated Diagnostic Agents Rash    "All over"  Other reaction(s): Unknown   Meloxicam Other (See Comments)    unknown   Methylprednisolone Sodium Succinate Other (See Comments)    unknown   Nabumetone Other (See Comments)    Unknown reaction   Penicillins Other (See Comments)    unknown   Pseudoephedrine-Guaifenesin Other (See Comments)    unknown   Risedronate Sodium Other (See Comments)    unknown   Rofecoxib Other (See Comments)    Unknown reaction - MAR Other reaction(s): Unknown   Ru-Tuss [Chlorphen-Pse-Atrop-Hyos-Scop] Other (See Comments)    unknown   Sulfonamide Derivatives Other (See Comments)    unknown   Sulphur [Elemental Sulfur] Other (See Comments)    unknown   Telithromycin Other (See Comments)    unknown   Terfenadine Other (See Comments)     Unknown reaction - Per MAR   Trandolapril-Verapamil Hcl Er Other (See Comments)    Headache, difficulty urinating, high blood sugar, fluid retention  Pt is taking Tarka (trandolapril-verapamil) currently, but requests the medication stay in her allergy list  Trandolapril-Verapamil Hcl Er Other (See Comments)    Headache, difficulty urinating, high blood sugar, fluid retention  Pt is taking Tarka (trandolapril-verapamil) currently, but requests the medication stay in her allergy list   Valium [Diazepam] Other (See Comments)    Makes her mean and hyper    Family History  Problem Relation Age of Onset   Heart disease Mother    Osteoporosis Mother    Diabetes Father    Pancreatic cancer Father    Bone cancer Sister    Rectal cancer Sister    Thyroid disease Sister        benign goiter resected   Prostate cancer Brother    Colon cancer Brother    Colon cancer Other       Prior to Admission medications   Medication Sig Start Date End Date Taking? Authorizing Provider  acetaminophen (TYLENOL) 500 MG tablet Take 2 tablets (1,000 mg total) by mouth 3 (three) times daily. Patient taking differently: Take 1,000 mg by mouth every 8 (eight) hours as needed for moderate pain. 08/21/20   Geradine Girt, DO  ALPRAZolam Duanne Moron) 0.25 MG tablet Take 1 tablet (0.25 mg total) by mouth 3 (three) times daily as needed for anxiety. 01/01/21   Kathrynn Ducking, MD  benzonatate (TESSALON) 200 MG capsule Take 1 capsule (200 mg total) by mouth 2 (two) times daily as needed for cough. 08/21/20   Geradine Girt, DO  Blood Glucose Monitoring Suppl (PRODIGY VOICE BLOOD GLUCOSE) w/Device KIT Use to check blood sugar 1 time per day. 10/18/15   Renato Shin, MD  Cholecalciferol (VITAMIN D-3) 125 MCG (5000 UT) TABS Take 5,000 Units by mouth daily.    [provider]  cholestyramine (QUESTRAN) 4 g packet Take 4 g by mouth 2 (two) times daily.    [provider]  cholestyramine light  (PREVALITE) 4 GM/DOSE powder Take 1 g by mouth See admin instructions. Mix 1 packet with water or non carbonated drink and drink by mouth twice daily at least 2 hours away from other medicines    [provider]  diclofenac Sodium (VOLTAREN) 1 % GEL Apply 2 g topically 4 (four) times daily as needed (pain). knees 06/01/19   [provider]  feeding supplement, GLUCERNA SHAKE, (GLUCERNA SHAKE) LIQD Take 237 mLs by mouth daily as needed (for poor appetite/dinner).    [provider]  glucose blood (GLUCOSE METER TEST) test strip See admin instructions.    [provider]  Glucose Blood (PRODIGY VOICE BLOOD GLUCOSE VI)     [provider]  insulin aspart (NOVOLOG) 100 UNIT/ML FlexPen Inject 2-12 Units into the skin See admin instructions. Injects 2-10 units of insulin under the skin 3 times a day per sliding scale: CBG 0-150: 2 units; CBG 151-200: 4 units; CBG 201-250: 6 units; CBG 251-300: 8 units; CBG 301-350: 10 units; CBG 351-400: 12 units; CBG >400: 12 units and notify the MD/NP 07/08/20   Cleophas Dunker, Amy E, NP  insulin glargine (LANTUS SOLOSTAR) 100 UNIT/ML Solostar Pen Inject 10 Units into the skin at bedtime. Patient taking differently: Inject 12 Units into the skin at bedtime. 08/21/20   Geradine Girt, DO  Insulin Pen Needle 32G X 4 MM MISC Used to inject insulin 3x daily 11/19/16   Renato Shin, MD  levothyroxine (SYNTHROID) 75 MCG tablet Take 1 tablet (75 mcg total) by mouth daily before breakfast. 07/08/20   Fargo, Amy E, NP  lidocaine (LIDODERM) 5 % Place 1 patch onto  the skin daily. Remove & Discard patch within 12 hours or as directed by MD Patient taking differently: Place 1 patch onto the skin daily as needed (for pain). Remove & Discard patch within 12 hours or as directed by MD 07/08/20   Yvonna Alanis, NP  nitrofurantoin, macrocrystal-monohydrate, (MACROBID) 100 MG capsule Take 1 capsule (100 mg total) by mouth 2 (two) times daily. 03/31/21   Azucena Cecil, PA  phenytoin (DILANTIN) 200 MG ER capsule Take 1 capsule (200 mg total) by mouth at bedtime. 01/01/21   Kathrynn Ducking, MD  Polyethyl Glycol-Propyl Glycol (SYSTANE) 0.4-0.3 % SOLN Place 2 drops into both eyes every 12 (twelve) hours as needed (dry eyes).    [provider]  primidone (MYSOLINE) 250 MG tablet Take 1 tablet (250 mg total) by mouth at bedtime. 03/10/21   Penumalli, Earlean Polka, MD  primidone (MYSOLINE) 50 MG tablet Take 1 tablet (50 mg total) by mouth every morning. Patient taking differently: Take 100 mg by mouth every morning. 12/30/20   Kathrynn Ducking, MD  Verapamil HCl CR 200 MG CP24 Take 200 mg by mouth daily. 10/19/20   [provider]    Physical Exam: BP 137/76 (BP Location: Right Arm)   Pulse 72   Temp 97.7 F (36.5 C) (Oral)   Resp 17   Ht 5' 7.5" (1.715 m)   Wt 65.2 kg   SpO2 100%   BMI 22.19 kg/m   General: 87 y.o. year-old female well developed well nourished in no acute distress.  Alert and oriented x3. Cardiovascular: Regular rate and rhythm with no rubs or gallops.  No thyromegaly or JVD noted.  No lower extremity edema. 2/4 pulses in all 4 extremities. Respiratory: Diffuse rales bilaterally with no wheezing noted. Good inspiratory effort. Abdomen: Soft nontender nondistended with normal bowel sounds x4 quadrants. Muskuloskeletal: No cyanosis, clubbing or edema noted bilaterally Neuro: CN II-XII intact, strength, sensation, reflexes Skin: No ulcerative lesions noted or rashes Psychiatry: Judgement and insight appear normal. Mood is appropriate for condition and setting          Labs on Admission:  Basic Metabolic Panel: Recent Labs  Lab 03/30/21 2230 04/03/21 1106 04/03/21 1156  NA 130* 128*  --   K 3.7 3.1*  --   CL 104 102  --   CO2 18* 18*  --   GLUCOSE 229* 218*  --   BUN 9 9  --   CREATININE 0.68 0.86  --   CALCIUM 8.3* 8.0*  --   MG  --   --  1.6*   Liver Function Tests: Recent Labs  Lab  03/30/21 2230 04/03/21 1106  AST 14* 16  ALT 14 15  ALKPHOS 76 112  BILITOT 0.5 0.5  PROT 6.5 6.8  ALBUMIN 3.6 3.4*   No results for input(s): LIPASE, AMYLASE in the last 168 hours. No results for input(s): AMMONIA in the last 168 hours. CBC: Recent Labs  Lab 03/30/21 2230 04/03/21 1106  WBC 8.5 13.1*  NEUTROABS 6.9 9.8*  HGB 11.2* 11.3*  HCT 33.1* 33.9*  MCV 92.2 93.4  PLT 238 313   Cardiac Enzymes: No results for input(s): CKTOTAL, CKMB, CKMBINDEX, TROPONINI in the last 168 hours.  BNP (last 3 results) No results for input(s): BNP in the last 8760 hours.  ProBNP (last 3 results) No results for input(s): PROBNP in the last 8760 hours.  CBG: No results for input(s): GLUCAP in the last 168 hours.  Radiological Exams  on Admission: DG Chest 2 View  Result Date: 04/03/2021 CLINICAL DATA:  Cough.  Worsening weakness. EXAM: CHEST - 2 VIEW COMPARISON:  03/30/2021 FINDINGS: Heart size is normal. Chronic aortic tortuosity. There are mild bilateral patchy pulmonary infiltrates, right more than left, consistent with bronchopneumonia. No dense consolidation or lobar collapse. Old midthoracic vertebral body compression fractures incidentally noted. IMPRESSION: Mild patchy bilateral pulmonary infiltrates consistent with pneumonia, right more than left. Electronically Signed   By: Nelson Chimes M.D.   On: 04/03/2021 10:39    EKG: I independently viewed the EKG done and my findings are as followed: Sinus rhythm rate of 73, PACs, nonspecific ST-T changes.  QTc 492.  Assessment/Plan Present on Admission:  CAP (community acquired pneumonia)  Principal Problem:   CAP (community acquired pneumonia)  Multifocal community-acquired pneumonia, POA Productive cough x1 week, subjective fevers and chills. Personally reviewed chest x-ray done on admission which shows bilateral pulmonary infiltrates suggestive of multifocal pneumonia. Patient has multiple drug allergies more than 60. She  received p.o. doxycycline in the ED x1 dose. Pharmacy consulted to assist with antibiotic choice for which the patient is not allergic to. Will start Unasyn and Benadryl. Obtain sputum culture if able IV antibiotics Gentle IV fluid hydration Bronchodilators Pulmonary toilet Mobilize as tolerated  Type 2 diabetes with hyperglycemia Update hemoglobin A1c Start insulin sliding scale  Hypovolemic hyponatremia Serum sodium 128 Start normal saline KCl at 50 cc/h x 2 days Repeat BMP in the morning Encourage increase of oral solute intake  Non anion gap metabolic acidosis Serum bicarb 18, anion gap of 8 Continue IV fluid hydration, if no improvement start sodium bicarb tablets  Electrolytes disturbances: Hypokalemia, hypomagnesemia Serum potassium 3.1, repleted orally Serum magnesium 1.6, repleted intravenously  Seizure disorder Resume home regimen Seizure precautions  Essential tremors Resume home regimen Fall precautions  Gait abnormality Uses a walker at baseline. PT OT to assess Fall precautions  Moderate protein calorie malnutrition Serum albumin 3.4 BMI 22 Encourage oral protein calorie intake Oral supplement twice daily   DVT prophylaxis: Subcu enoxaparin   Code Status: Full code  Family Communication: None at bedside.  Disposition Plan: Admit to telemetry unit at [facility] called: None.  Admission status: Inpatient status.    Status is: Inpatient  Patient will require at least 2 midnights for further evaluation and treatment of present condition.        Kayleen Memos MD [facility] Pager [phone]  If 7PM-7AM, please contact night-coverage [email or web address] Password [email or web address]  04/03/2021, 2:22 PM

## 2021-04-04 LAB — CBC
HCT: 28.9 % — ABNORMAL LOW (ref 36.0–46.0)
Hemoglobin: 9.8 g/dL — ABNORMAL LOW (ref 12.0–15.0)
MCH: 31.2 pg (ref 26.0–34.0)
MCHC: 33.9 g/dL (ref 30.0–36.0)
MCV: 92 fL (ref 80.0–100.0)
Platelets: 325 10*3/uL (ref 150–400)
RBC: 3.14 MIL/uL — ABNORMAL LOW (ref 3.87–5.11)
RDW: 14.1 % (ref 11.5–15.5)
WBC: 9.1 10*3/uL (ref 4.0–10.5)
nRBC: 0 % (ref 0.0–0.2)

## 2021-04-04 LAB — COMPREHENSIVE METABOLIC PANEL
ALT: 15 U/L (ref 0–44)
AST: 14 U/L — ABNORMAL LOW (ref 15–41)
Albumin: 2.9 g/dL — ABNORMAL LOW (ref 3.5–5.0)
Alkaline Phosphatase: 112 U/L (ref 38–126)
Anion gap: 4 — ABNORMAL LOW (ref 5–15)
BUN: 8 mg/dL (ref 8–23)
CO2: 17 mmol/L — ABNORMAL LOW (ref 22–32)
Calcium: 7.7 mg/dL — ABNORMAL LOW (ref 8.9–10.3)
Chloride: 108 mmol/L (ref 98–111)
Creatinine, Ser: 0.63 mg/dL (ref 0.44–1.00)
GFR, Estimated: >60 mL/min (ref >60)
Glucose, Bld: 208 mg/dL — ABNORMAL HIGH (ref 70–99)
Potassium: 4.7 mmol/L (ref 3.5–5.1)
Sodium: 129 mmol/L — ABNORMAL LOW (ref 135–145)
Total Bilirubin: 0.7 mg/dL (ref 0.3–1.2)
Total Protein: 5.6 g/dL — ABNORMAL LOW (ref 6.5–8.1)

## 2021-04-04 LAB — CBG MONITORING, ED
Glucose-Capillary: 192 mg/dL — ABNORMAL HIGH (ref 70–99)
Glucose-Capillary: 220 mg/dL — ABNORMAL HIGH (ref 70–99)

## 2021-04-04 LAB — PHOSPHORUS: Phosphorus: 1.2 mg/dL — ABNORMAL LOW (ref 2.5–4.6)

## 2021-04-04 LAB — MAGNESIUM: Magnesium: 2 mg/dL (ref 1.7–2.4)

## 2021-04-04 LAB — PROCALCITONIN: Procalcitonin: 0.2 ng/mL

## 2021-04-04 LAB — GLUCOSE, CAPILLARY
Glucose-Capillary: 127 mg/dL — ABNORMAL HIGH (ref 70–99)
Glucose-Capillary: 123 mg/dL — ABNORMAL HIGH (ref 70–99)

## 2021-04-04 MED ORDER — PRIMIDONE 250 MG PO TABS
250.0000 mg | ORAL_TABLET | Freq: Every day | ORAL | Status: DC
  Administered 2021-04-05 – 2021-04-08 (×4): 250 mg via ORAL
  Filled 2021-04-04 (×4): qty 1

## 2021-04-04 MED ORDER — SODIUM PHOSPHATES 45 MMOLE/15ML IV SOLN
30.0000 mmol | Freq: Once | INTRAVENOUS | Status: AC
  Administered 2021-04-04: 30 mmol via INTRAVENOUS
  Filled 2021-04-04: qty 10

## 2021-04-04 MED ORDER — PRIMIDONE 50 MG PO TABS
150.0000 mg | ORAL_TABLET | Freq: Once | ORAL | Status: AC
  Administered 2021-04-04: 150 mg via ORAL
  Filled 2021-04-04: qty 3

## 2021-04-04 MED ORDER — TRAMADOL HCL 50 MG PO TABS
50.0000 mg | ORAL_TABLET | Freq: Two times a day (BID) | ORAL | Status: DC | PRN
  Administered 2021-04-04 – 2021-04-06 (×3): 50 mg via ORAL
  Filled 2021-04-04 (×3): qty 1

## 2021-04-04 MED ORDER — SODIUM BICARBONATE 650 MG PO TABS
1300.0000 mg | ORAL_TABLET | Freq: Four times a day (QID) | ORAL | Status: DC
  Administered 2021-04-04 – 2021-04-05 (×3): 1300 mg via ORAL
  Filled 2021-04-04 (×4): qty 2

## 2021-04-04 NOTE — Progress Notes (Signed)
PROGRESS NOTE  [name] GXQ:[id number] DOB: 09/27/1929 DOA: 04/03/2021 PCP: Bartholome Bill, MD  HPI/Recap of past 24 hours: [name] is a 87 y.o. female with medical history significant for seizure disorder, gait abnormality, essential tremor, who presented to [facility] ED from home due to worsening productive cough and generalized weakness of 1 week duration.  She states she started coughing about a month ago and it has gradually been worsening.  Associated with subjective fevers and chills for the past 2 days.  She presented to the ED at [facility] on 03/30/2021 for cough and generalized body aches, was diagnosed with bronchitis and UTI.  Received symptomatic treatment of bronchitis in the ED and was discharged from the ED with prescription for Macrobid 100 mg twice daily x5 days for presumptive UTI.  Urine culture returned resistant to Macrobid, switched to Bactrim which she took yesterday and this morning.  She presents today with persistent cough and generalized weakness.  Lives alone,-difficult to take care of her activities of daily living due to her acute illness.  Work-up in the ED reveals multifocal pneumonia seen on chest x-ray for which she received a dose of doxycycline in the ED.  Also showing electrolyte abnormalities including hyponatremia, hypokalemia, hypomagnesemia, metabolic acidosis with serum bicarb of 18.  COVID-19 and respiratory viral panel negative.  TRH, hospitalist service was asked to admit.   ED Course: Afebrile, T-max 97.8.  BP 137/76, pulse 71, respiration rate 14, O2 saturation 100% on room air.  Lab studies remarkable for WBC 13.1, hemoglobin 11.3, MCV 93, platelet count 313.  Neutrophil count 9.8.  Serum sodium 128, potassium 3.1, magnesium 1.6.  Serum bicarb 18, serum glucose 215, calcium 8.0, albumin 3.4.  04/04/2021: Patient was seen and examined at bedside in the ED.  Reports persistent cough.  States it is not getting  better.  Assessment/Plan: Principal Problem:   CAP (community acquired pneumonia)  Multifocal community-acquired pneumonia, POA Productive cough x1 week, subjective fevers and chills. Personally reviewed chest x-ray done on admission which shows bilateral pulmonary infiltrates suggestive of multifocal pneumonia. Patient has multiple drug allergies more than 60. She received p.o. doxycycline in the ED x1 dose. Pharmacy consulted to assist with antibiotic choice for which the patient is not allergic to. Received Unasyn and Benadryl, tolerated well. Continue Unasyn Obtain sputum culture if able Gentle IV fluid hydration Continue bronchodilators and pulmonary toilet.  Mobilize as tolerated  Non anion gap metabolic acidosis Serum bicarb 17 Anion gap 4 Start sodium bicarb p.o. Repeat BMP in the morning  Hypophosphatemia Replaced intravenously.  Resolved hypokalemia, post repletion. Serum potassium 4.7 from 3.1.  Resolved hypomagnesemia, post repletion Magnesium 2.0 from 1.6   Type 2 diabetes with hyperglycemia Hemoglobin A1c 6.6 on 04/03/2021. Continue insulin sliding scale   Hypovolemic hyponatremia Serum sodium 128> 129. Continue to encourage increase of oral solute intake   Non anion gap metabolic acidosis Serum bicarb 18, anion gap of 8 Continue IV fluid hydration, if no improvement start sodium bicarb tablets   Electrolytes disturbances: Hypokalemia, hypomagnesemia Serum potassium 3.1, repleted orally Serum magnesium 1.6, repleted intravenously   Seizure disorder Resume home regimen Seizure precautions   Essential tremors Resume home regimen Fall precautions   Gait abnormality Uses a walker at baseline. PT OT to assess. Continue fall precautions   Moderate protein calorie malnutrition Serum albumin 3.4 BMI 22 Continue to encourage oral protein calorie intake Oral supplement twice daily     DVT prophylaxis: Subcu enoxaparin  Code Status: Full  code   Family Communication: None at bedside.   Disposition Plan: Admit to telemetry unit at [facility] called: None.     Objective: Vitals:   04/04/21 1315 04/04/21 1320 04/04/21 1409 04/04/21 1444  BP:  138/66  (!) 149/79  Pulse: 86 87  87  Resp: (!) 25 (!) 27  16  Temp:   98.4 F (36.9 C) 99.4 F (37.4 C)  TempSrc:   Oral Oral  SpO2: 98% 99%  99%  Weight:      Height:        Intake/Output Summary (Last 24 hours) at 04/04/2021 1645 Last data filed at 04/04/2021 1550 Gross per 24 hour  Intake 436 ml  Output --  Net 436 ml   Filed Weights   04/03/21 1359  Weight: 65.2 kg    Exam:  General: 87 y.o. year-old female  frail-appearingin no acute distress.  Alert and oriented x3. Cardiovascular: Regular rate and rhythm with no rubs or gallops.  No thyromegaly or JVD noted.   Respiratory: Clear to auscultation with no wheezes or rales. Good inspiratory effort. Abdomen: Soft nontender nondistended with normal bowel sounds x4 quadrants. Musculoskeletal: No lower extremity edema. 2/4 pulses in all 4 extremities. Skin: No ulcerative lesions noted or rashes, Psychiatry: Mood is appropriate for condition and setting   Data Reviewed: CBC: Recent Labs  Lab 03/30/21 2230 04/03/21 1106 04/04/21 0500  WBC 8.5 13.1* 9.1  NEUTROABS 6.9 9.8*  --   HGB 11.2* 11.3* 9.8*  HCT 33.1* 33.9* 28.9*  MCV 92.2 93.4 92.0  PLT 238 313 161   Basic Metabolic Panel: Recent Labs  Lab 03/30/21 2230 04/03/21 1106 04/03/21 1156 04/04/21 0500  NA 130* 128*  --  129*  K 3.7 3.1*  --  4.7  CL 104 102  --  108  CO2 18* 18*  --  17*  GLUCOSE 229* 218*  --  208*  BUN 9 9  --  8  CREATININE 0.68 0.86  --  0.63  CALCIUM 8.3* 8.0*  --  7.7*  MG  --   --  1.6* 2.0  PHOS  --   --   --  1.2*   GFR: Estimated Creatinine Clearance: 47.3 mL/min (by C-G formula based on SCr of 0.63 mg/dL). Liver Function Tests: Recent Labs  Lab 03/30/21 2230 04/03/21 1106  04/04/21 0500  AST 14* 16 14*  ALT 14 15 15   ALKPHOS 76 112 112  BILITOT 0.5 0.5 0.7  PROT 6.5 6.8 5.6*  ALBUMIN 3.6 3.4* 2.9*   No results for input(s): LIPASE, AMYLASE in the last 168 hours. No results for input(s): AMMONIA in the last 168 hours. Coagulation Profile: No results for input(s): INR, PROTIME in the last 168 hours. Cardiac Enzymes: No results for input(s): CKTOTAL, CKMB, CKMBINDEX, TROPONINI in the last 168 hours. BNP (last 3 results) No results for input(s): PROBNP in the last 8760 hours. HbA1C: Recent Labs    04/03/21 1105  HGBA1C 6.6*   CBG: Recent Labs  Lab 04/03/21 1426 04/03/21 1710 04/04/21 0751 04/04/21 1151  GLUCAP 164* 138* 192* 220*   Lipid Profile: No results for input(s): CHOL, HDL, LDLCALC, TRIG, CHOLHDL, LDLDIRECT in the last 72 hours. Thyroid Function Tests: No results for input(s): TSH, T4TOTAL, FREET4, T3FREE, THYROIDAB in the last 72 hours. Anemia Panel: No results for input(s): VITAMINB12, FOLATE, FERRITIN, TIBC, IRON, RETICCTPCT in the last 72 hours. Urine analysis:    Component Value  Date/Time   COLORURINE YELLOW 04/03/2021 [place] 04/03/2021 1511   LABSPEC 1.013 04/03/2021 1511   PHURINE 5.0 04/03/2021 1511   GLUCOSEU NEGATIVE 04/03/2021 1511   GLUCOSEU NEGATIVE 12/28/2013 1036   HGBUR NEGATIVE 04/03/2021 1511   HGBUR trace-intact 06/29/2007 1304   BILIRUBINUR NEGATIVE 04/03/2021 1511   BILIRUBINUR negative 01/19/2017 1049   KETONESUR 20 (A) 04/03/2021 1511   PROTEINUR 30 (A) 04/03/2021 1511   UROBILINOGEN 0.2 01/19/2017 1049   UROBILINOGEN 1.0 03/25/2015 1755   NITRITE NEGATIVE 04/03/2021 1511   LEUKOCYTESUR NEGATIVE 04/03/2021 1511   Sepsis Labs: @LABRCNTIP (procalcitonin:4,lacticidven:4)  ) Recent Results (from the past 240 hour(s))  Resp Panel by RT-PCR (Flu A&B, Covid) Nasopharyngeal Swab     Status: None   Collection Time: 03/30/21 10:40 PM   Specimen: Nasopharyngeal Swab; Nasopharyngeal(NP)  swabs in vial transport medium  Result Value Ref Range Status   SARS Coronavirus 2 by RT PCR NEGATIVE NEGATIVE Final    Comment: (NOTE) SARS-CoV-2 target nucleic acids are NOT DETECTED.  The SARS-CoV-2 RNA is generally detectable in upper respiratory specimens during the acute phase of infection. The lowest concentration of SARS-CoV-2 viral copies this assay can detect is 138 copies/mL. A negative result does not preclude SARS-Cov-2 infection and should not be used as the sole basis for treatment or other patient management decisions. A negative result Scalera occur with  improper specimen collection/handling, submission of specimen other than nasopharyngeal swab, presence of viral mutation(s) within the areas targeted by this assay, and inadequate number of viral copies(<138 copies/mL). A negative result must be combined with clinical observations, patient history, and epidemiological information. The expected result is Negative.  Fact Sheet for Patients:  EntrepreneurPulse.com.au  Fact Sheet for Healthcare Providers:  IncredibleEmployment.be  This test is no t yet approved or cleared by the [place] FDA and  has been authorized for detection and/or diagnosis of SARS-CoV-2 by FDA under an Emergency Use Authorization (EUA). This EUA will remain  in effect (meaning this test can be used) for the duration of the COVID-19 declaration under Section 564(b)(1) of the Act, 21 U.S.C.section 360bbb-3(b)(1), unless the authorization is terminated  or revoked sooner.       Influenza A by PCR NEGATIVE NEGATIVE Final   Influenza B by PCR NEGATIVE NEGATIVE Final    Comment: (NOTE) The Xpert Xpress SARS-CoV-2/FLU/RSV plus assay is intended as an aid in the diagnosis of influenza from Nasopharyngeal swab specimens and should not be used as a sole basis for treatment. Nasal washings and aspirates are unacceptable for Xpert Xpress  SARS-CoV-2/FLU/RSV testing.  Fact Sheet for Patients: EntrepreneurPulse.com.au  Fact Sheet for Healthcare Providers: IncredibleEmployment.be  This test is not yet approved or cleared by the [place] FDA and has been authorized for detection and/or diagnosis of SARS-CoV-2 by FDA under an Emergency Use Authorization (EUA). This EUA will remain in effect (meaning this test can be used) for the duration of the COVID-19 declaration under Section 564(b)(1) of the Act, 21 U.S.C. section 360bbb-3(b)(1), unless the authorization is terminated or revoked.  Performed at [facility], [place] [address], [place], [place] [address]   Urine Culture     Status: Abnormal   Collection Time: 03/31/21  2:38 AM   Specimen: In/Out Cath Urine  Result Value Ref Range Status   Specimen Description   Final    IN/OUT CATH URINE Performed at [facility] [address] [place], [place] [address]    Special Requests  Final    NONE Performed at [facility], [place] [address], [place], [place] [address]    Culture >=100,000 COLONIES/mL KLEBSIELLA PNEUMONIAE (A)  Final   Report Status 04/01/2021 FINAL  Final   Organism ID, Bacteria KLEBSIELLA PNEUMONIAE (A)  Final      Susceptibility   Klebsiella pneumoniae - MIC*    AMPICILLIN >=32 RESISTANT Resistant     CEFAZOLIN <=4 SENSITIVE Sensitive     CEFEPIME <=0.12 SENSITIVE Sensitive     CEFTRIAXONE <=0.25 SENSITIVE Sensitive     CIPROFLOXACIN <=0.25 SENSITIVE Sensitive     GENTAMICIN <=1 SENSITIVE Sensitive     IMIPENEM <=0.25 SENSITIVE Sensitive     NITROFURANTOIN 64 INTERMEDIATE Intermediate     TRIMETH/SULFA <=20 SENSITIVE Sensitive     AMPICILLIN/SULBACTAM <=2 SENSITIVE Sensitive     PIP/TAZO <=4 SENSITIVE Sensitive     * >=100,000 COLONIES/mL KLEBSIELLA PNEUMONIAE  Resp Panel by RT-PCR (Flu A&B, Covid)     Status: None   Collection Time: 04/03/21 10:56  AM  Result Value Ref Range Status   SARS Coronavirus 2 by RT PCR NEGATIVE NEGATIVE Final    Comment: (NOTE) SARS-CoV-2 target nucleic acids are NOT DETECTED.  The SARS-CoV-2 RNA is generally detectable in upper respiratory specimens during the acute phase of infection. The lowest concentration of SARS-CoV-2 viral copies this assay can detect is 138 copies/mL. A negative result does not preclude SARS-Cov-2 infection and should not be used as the sole basis for treatment or other patient management decisions. A negative result [place] occur with  improper specimen collection/handling, submission of specimen other than nasopharyngeal swab, presence of viral mutation(s) within the areas targeted by this assay, and inadequate number of viral copies(<138 copies/mL). A negative result must be combined with clinical observations, patient history, and epidemiological information. The expected result is Negative.  Fact Sheet for Patients:  EntrepreneurPulse.com.au  Fact Sheet for Healthcare Providers:  IncredibleEmployment.be  This test is no t yet approved or cleared by the [place] FDA and  has been authorized for detection and/or diagnosis of SARS-CoV-2 by FDA under an Emergency Use Authorization (EUA). This EUA will remain  in effect (meaning this test can be used) for the duration of the COVID-19 declaration under Section 564(b)(1) of the Act, 21 U.S.C.section 360bbb-3(b)(1), unless the authorization is terminated  or revoked sooner.       Influenza A by PCR NEGATIVE NEGATIVE Final   Influenza B by PCR NEGATIVE NEGATIVE Final    Comment: (NOTE) The Xpert Xpress SARS-CoV-2/FLU/RSV plus assay is intended as an aid in the diagnosis of influenza from Nasopharyngeal swab specimens and should not be used as a sole basis for treatment. Nasal washings and aspirates are unacceptable for Xpert Xpress SARS-CoV-2/FLU/RSV testing.  Fact Sheet for  Patients: EntrepreneurPulse.com.au  Fact Sheet for Healthcare Providers: IncredibleEmployment.be  This test is not yet approved or cleared by the [place] FDA and has been authorized for detection and/or diagnosis of SARS-CoV-2 by FDA under an Emergency Use Authorization (EUA). This EUA will remain in effect (meaning this test can be used) for the duration of the COVID-19 declaration under Section 564(b)(1) of the Act, 21 U.S.C. section 360bbb-3(b)(1), unless the authorization is terminated or revoked.  Performed at [facility], [place] [address], [place], [place] [address]   Culture, blood (routine x 2)     Status: None (Preliminary result)   Collection Time: 04/03/21 11:06 AM   Specimen: Left Antecubital; Blood  Result Value Ref Range  Status   Specimen Description   Final    LEFT ANTECUBITAL Performed at [facility] [address] [place], [place] [address]    Special Requests   Final    BOTTLES DRAWN AEROBIC AND ANAEROBIC Blood Culture adequate volume Performed at [facility] [address], [place], [place] [address]    Culture   Final    NO GROWTH < 24 HOURS Performed at [facility] [address] [place], [place] [address]    Report Status PENDING  Incomplete  Culture, blood (routine x 2)     Status: None (Preliminary result)   Collection Time: 04/03/21  1:33 PM   Specimen: BLOOD  Result Value Ref Range Status   Specimen Description   Final    BLOOD LEFT ANTECUBITAL Performed at [facility] [address], [place], [place] [address]    Special Requests   Final    BOTTLES DRAWN AEROBIC AND ANAEROBIC Blood Culture results [place] not be optimal due to an excessive volume of blood received in culture bottles Performed at [facility] [address] [place], [place] [address]    Culture   Final    NO GROWTH < 12  HOURS Performed at [facility] [address], [place], [place] [address]    Report Status PENDING  Incomplete      Studies: No results found.  Scheduled Meds:  enoxaparin (LOVENOX) injection  40 mg Subcutaneous Q24H   insulin aspart  0-5 Units Subcutaneous QHS   insulin aspart  0-9 Units Subcutaneous TID WC   levothyroxine  75 mcg Oral QAC breakfast   phenytoin  200 mg Oral QHS   [START ON 04/05/2021] primidone  250 mg Oral Daily   verapamil  210 mg Oral Daily    Continuous Infusions:  0.9 % NaCl with KCl 40 mEq / L 50 mL/hr at 04/03/21 2233   ampicillin-sulbactam (UNASYN) IV 1.5 g (04/04/21 0936)     LOS: 1 day     Kayleen Memos, MD [facility] Pager [phone]  If 7PM-7AM, please contact night-coverage [email or web address] Password [facility] 04/04/2021, 4:45 PM

## 2021-04-04 NOTE — ED Notes (Signed)
Pt placed on purewick 

## 2021-04-05 LAB — RESPIRATORY PANEL BY PCR

## 2021-04-05 LAB — GLUCOSE, CAPILLARY
Glucose-Capillary: 172 mg/dL — ABNORMAL HIGH (ref 70–99)
Glucose-Capillary: 163 mg/dL — ABNORMAL HIGH (ref 70–99)
Glucose-Capillary: 202 mg/dL — ABNORMAL HIGH (ref 70–99)
Glucose-Capillary: 254 mg/dL — ABNORMAL HIGH (ref 70–99)

## 2021-04-05 LAB — BASIC METABOLIC PANEL
Anion gap: 5 (ref 5–15)
BUN: 6 mg/dL — ABNORMAL LOW (ref 8–23)
CO2: 18 mmol/L — ABNORMAL LOW (ref 22–32)
Calcium: 7.6 mg/dL — ABNORMAL LOW (ref 8.9–10.3)
Chloride: 106 mmol/L (ref 98–111)
Creatinine, Ser: 0.62 mg/dL (ref 0.44–1.00)
GFR, Estimated: >60 mL/min (ref >60)
Glucose, Bld: 178 mg/dL — ABNORMAL HIGH (ref 70–99)
Potassium: 3.6 mmol/L (ref 3.5–5.1)
Sodium: 129 mmol/L — ABNORMAL LOW (ref 135–145)

## 2021-04-05 LAB — PHOSPHORUS: Phosphorus: 1.6 mg/dL — ABNORMAL LOW (ref 2.5–4.6)

## 2021-04-05 LAB — MAGNESIUM: Magnesium: 1.9 mg/dL (ref 1.7–2.4)

## 2021-04-05 MED ORDER — SACCHAROMYCES BOULARDII 250 MG PO CAPS
250.0000 mg | ORAL_CAPSULE | Freq: Two times a day (BID) | ORAL | Status: DC
  Administered 2021-04-05 – 2021-04-08 (×7): 250 mg via ORAL
  Filled 2021-04-05 (×7): qty 1

## 2021-04-05 MED ORDER — SODIUM BICARBONATE 650 MG PO TABS
1300.0000 mg | ORAL_TABLET | Freq: Four times a day (QID) | ORAL | Status: AC
  Administered 2021-04-05 – 2021-04-07 (×8): 1300 mg via ORAL
  Filled 2021-04-05 (×7): qty 2

## 2021-04-05 MED ORDER — SODIUM PHOSPHATES 45 MMOLE/15ML IV SOLN
30.0000 mmol | Freq: Once | INTRAVENOUS | Status: AC
  Administered 2021-04-05: 30 mmol via INTRAVENOUS
  Filled 2021-04-05: qty 10

## 2021-04-05 MED ORDER — LOPERAMIDE HCL 2 MG PO CAPS
2.0000 mg | ORAL_CAPSULE | ORAL | Status: DC | PRN
  Administered 2021-04-05: 2 mg via ORAL
  Filled 2021-04-05: qty 1

## 2021-04-05 NOTE — Progress Notes (Signed)
PROGRESS NOTE  [name] OEU:[id number] DOB: Dinius 20, 1931 DOA: 04/03/2021 PCP: Bartholome Bill, MD  HPI/Recap of past 24 hours: [name] is a 87 y.o. female with medical history significant for seizure disorder, gait abnormality, essential tremor, who presented to [facility] ED from home due to worsening productive cough and generalized weakness of 1 week duration.  She states she started coughing about a month ago and it has gradually been worsening.  Associated with subjective fevers and chills for the past 2 days.  She presented to the ED at [facility] on 03/30/2021 for cough and generalized body aches, was diagnosed with bronchitis and UTI.  Received symptomatic treatment of bronchitis in the ED and was discharged from the ED with prescription for Macrobid 100 mg twice daily x5 days for presumptive UTI.  Urine culture returned resistant to Macrobid, switched to Bactrim which she took yesterday and this morning.  She presents today with persistent cough and generalized weakness.  Lives alone,-difficult to take care of her activities of daily living due to her acute illness.  Work-up in the ED reveals multifocal pneumonia seen on chest x-ray for which she received a dose of doxycycline in the ED.  Also showing electrolyte abnormalities including hyponatremia, hypokalemia, hypomagnesemia, metabolic acidosis with serum bicarb of 18.  COVID-19 and respiratory viral panel negative.  TRH, hospitalist service was asked to admit.   ED Course: Afebrile, T-max 97.8.  BP 137/76, pulse 71, respiration rate 14, O2 saturation 100% on room air.  Lab studies remarkable for WBC 13.1, hemoglobin 11.3, MCV 93, platelet count 313.  Neutrophil count 9.8.  Serum sodium 128, potassium 3.1, magnesium 1.6.  Serum bicarb 18, serum glucose 215, calcium 8.0, albumin 3.4.  04/05/2021: Seen and examined at bedside.  Reports of persistent cough.  Loose stool this morning.  Probiotic Florastor  added.  Assessment/Plan: Principal Problem:   CAP (community acquired pneumonia)  Multifocal community-acquired pneumonia, POA Productive cough x1 week, subjective fevers and chills. Personally reviewed chest x-ray done on admission which shows bilateral pulmonary infiltrates suggestive of multifocal pneumonia. Patient has multiple drug allergies more than 60. She received p.o. doxycycline in the ED x1 dose. Pharmacy consulted to assist with antibiotic choice for which the patient is not allergic to. Received Unasyn and Benadryl, tolerated well. Continue Unasyn Leukocytosis has resolved. Obtain sputum culture if able Gentle IV fluid hydration Continue bronchodilators and pulmonary toilet.  Mobilize as tolerated  Improving Non anion gap metabolic acidosis Serum bicarb 17 Anion gap 4 Continue sodium bicarb p.o. Repeat BMP in the morning  Refractory hypophosphatemia Replaced intravenously x2  Resolved hypokalemia, post repletion. Serum potassium 4.7 from 3.1.  Resolved hypomagnesemia, post repletion Magnesium 2.0 from 1.6   Type 2 diabetes with hyperglycemia Hemoglobin A1c 6.6 on 04/03/2021. Continue insulin sliding scale   Hypovolemic hyponatremia Serum sodium 128> 129. Continue to encourage increase of oral solute intake   Non anion gap metabolic acidosis Serum bicarb 18, anion gap of 8 Continue IV fluid hydration, if no improvement start sodium bicarb tablets   Electrolytes disturbances: Hypokalemia, hypomagnesemia Serum potassium 3.1, repleted orally Serum magnesium 1.6, repleted intravenously   Seizure disorder Continue home regimen Seizure precautions   Essential tremors Continue home regimen Fall precautions   Gait abnormality Uses a walker at baseline. PT OT assessed and recommended SNF TOC consulted to assist with SNF placement.  Continuefall precautions   Moderate protein calorie malnutrition Serum albumin 3.4 BMI 22 Continue to encourage oral  protein calorie  intake Oral supplement twice daily  Likely antibiotics induced loose stools Start Florastor 04/05/2021 Imodium as needed     DVT prophylaxis: Subcu enoxaparin daily   Code Status: Full code   Family Communication: None at bedside.   Disposition Plan: Admit to telemetry unit at [place] called: None.     Objective: Vitals:   04/04/21 1444 04/04/21 2245 04/05/21 0245 04/05/21 0900  BP: (!) 149/79 125/65 (!) 142/72   Pulse: 87 74 76   Resp: 16 (!) 24 (!) 24   Temp: 99.4 F (37.4 C) (!) 97.1 F (36.2 C) 97.9 F (36.6 C)   TempSrc: Oral Axillary Axillary   SpO2: 99% 94% 94% 97%  Weight:      Height:        Intake/Output Summary (Last 24 hours) at 04/05/2021 1658 Last data filed at 04/05/2021 0300 Gross per 24 hour  Intake 200 ml  Output 300 ml  Net -100 ml   Filed Weights   04/03/21 1359  Weight: 65.2 kg    Exam:  General: 87 y.o. year-old female appearing in no acute distress.  She is alert and oriented x3.   Cardiovascular: Regular rate and rhythm no rubs or gallops.  No JVD or thyromegaly noted.   Respiratory: Mild rales at bases with no wheezing noted.  Poor inspiratory effort. Abdomen:  Musculoskeletal: No lower extremity edema bilaterally.   Skin: No ulcerative lesions noted.   Psychiatry: Mood is appropriate for condition and setting.   Data Reviewed: CBC: Recent Labs  Lab 03/30/21 2230 04/03/21 1106 04/04/21 0500  WBC 8.5 13.1* 9.1  NEUTROABS 6.9 9.8*  --   HGB 11.2* 11.3* 9.8*  HCT 33.1* 33.9* 28.9*  MCV 92.2 93.4 92.0  PLT 238 313 716   Basic Metabolic Panel: Recent Labs  Lab 03/30/21 2230 04/03/21 1106 04/03/21 1156 04/04/21 0500 04/05/21 0605  NA 130* 128*  --  129* 129*  K 3.7 3.1*  --  4.7 3.6  CL 104 102  --  108 106  CO2 18* 18*  --  17* 18*  GLUCOSE 229* 218*  --  208* 178*  BUN 9 9  --  8 6*  CREATININE 0.68 0.86  --  0.63 0.62  CALCIUM 8.3* 8.0*  --  7.7* 7.6*  MG  --   --  1.6*  2.0 1.9  PHOS  --   --   --  1.2* 1.6*   GFR: Estimated Creatinine Clearance: 47.3 mL/min (by C-G formula based on SCr of 0.62 mg/dL). Liver Function Tests: Recent Labs  Lab 03/30/21 2230 04/03/21 1106 04/04/21 0500  AST 14* 16 14*  ALT 14 15 15   ALKPHOS 76 112 112  BILITOT 0.5 0.5 0.7  PROT 6.5 6.8 5.6*  ALBUMIN 3.6 3.4* 2.9*   No results for input(s): LIPASE, AMYLASE in the last 168 hours. No results for input(s): AMMONIA in the last 168 hours. Coagulation Profile: No results for input(s): INR, PROTIME in the last 168 hours. Cardiac Enzymes: No results for input(s): CKTOTAL, CKMB, CKMBINDEX, TROPONINI in the last 168 hours. BNP (last 3 results) No results for input(s): PROBNP in the last 8760 hours. HbA1C: Recent Labs    04/03/21 1105  HGBA1C 6.6*   CBG: Recent Labs  Lab 04/04/21 1645 04/04/21 2054 04/05/21 0800 04/05/21 1129 04/05/21 1645  GLUCAP 127* 123* 172* 163* 202*   Lipid Profile: No results for input(s): CHOL, HDL, LDLCALC, TRIG, CHOLHDL, LDLDIRECT in the last 72 hours.  Thyroid Function Tests: No results for input(s): TSH, T4TOTAL, FREET4, T3FREE, THYROIDAB in the last 72 hours. Anemia Panel: No results for input(s): VITAMINB12, FOLATE, FERRITIN, TIBC, IRON, RETICCTPCT in the last 72 hours. Urine analysis:    Component Value Date/Time   COLORURINE YELLOW 04/03/2021 [place] 04/03/2021 1511   LABSPEC 1.013 04/03/2021 1511   PHURINE 5.0 04/03/2021 1511   GLUCOSEU NEGATIVE 04/03/2021 1511   GLUCOSEU NEGATIVE 12/28/2013 1036   HGBUR NEGATIVE 04/03/2021 1511   HGBUR trace-intact 06/29/2007 1304   BILIRUBINUR NEGATIVE 04/03/2021 1511   BILIRUBINUR negative 01/19/2017 1049   KETONESUR 20 (A) 04/03/2021 1511   PROTEINUR 30 (A) 04/03/2021 1511   UROBILINOGEN 0.2 01/19/2017 1049   UROBILINOGEN 1.0 03/25/2015 1755   NITRITE NEGATIVE 04/03/2021 1511   LEUKOCYTESUR NEGATIVE 04/03/2021 1511   Sepsis  Labs: @LABRCNTIP (procalcitonin:4,lacticidven:4)  ) Recent Results (from the past 240 hour(s))  Resp Panel by RT-PCR (Flu A&B, Covid) Nasopharyngeal Swab     Status: None   Collection Time: 03/30/21 10:40 PM   Specimen: Nasopharyngeal Swab; Nasopharyngeal(NP) swabs in vial transport medium  Result Value Ref Range Status   SARS Coronavirus 2 by RT PCR NEGATIVE NEGATIVE Final    Comment: (NOTE) SARS-CoV-2 target nucleic acids are NOT DETECTED.  The SARS-CoV-2 RNA is generally detectable in upper respiratory specimens during the acute phase of infection. The lowest concentration of SARS-CoV-2 viral copies this assay can detect is 138 copies/mL. A negative result does not preclude SARS-Cov-2 infection and should not be used as the sole basis for treatment or other patient management decisions. A negative result Rugg occur with  improper specimen collection/handling, submission of specimen other than nasopharyngeal swab, presence of viral mutation(s) within the areas targeted by this assay, and inadequate number of viral copies(<138 copies/mL). A negative result must be combined with clinical observations, patient history, and epidemiological information. The expected result is Negative.  Fact Sheet for Patients:  EntrepreneurPulse.com.au  Fact Sheet for Healthcare Providers:  IncredibleEmployment.be  This test is no t yet approved or cleared by the [place] FDA and  has been authorized for detection and/or diagnosis of SARS-CoV-2 by FDA under an Emergency Use Authorization (EUA). This EUA will remain  in effect (meaning this test can be used) for the duration of the COVID-19 declaration under Section 564(b)(1) of the Act, 21 U.S.C.section 360bbb-3(b)(1), unless the authorization is terminated  or revoked sooner.       Influenza A by PCR NEGATIVE NEGATIVE Final   Influenza B by PCR NEGATIVE NEGATIVE Final    Comment: (NOTE) The Xpert  Xpress SARS-CoV-2/FLU/RSV plus assay is intended as an aid in the diagnosis of influenza from Nasopharyngeal swab specimens and should not be used as a sole basis for treatment. Nasal washings and aspirates are unacceptable for Xpert Xpress SARS-CoV-2/FLU/RSV testing.  Fact Sheet for Patients: EntrepreneurPulse.com.au  Fact Sheet for Healthcare Providers: IncredibleEmployment.be  This test is not yet approved or cleared by the [place] FDA and has been authorized for detection and/or diagnosis of SARS-CoV-2 by FDA under an Emergency Use Authorization (EUA). This EUA will remain in effect (meaning this test can be used) for the duration of the COVID-19 declaration under Section 564(b)(1) of the Act, 21 U.S.C. section 360bbb-3(b)(1), unless the authorization is terminated or revoked.  Performed at [facility], [place] [address], [place], [place] [address]   Urine Culture     Status: Abnormal   Collection Time: 03/31/21  2:38 AM   Specimen:  In/Out Cath Urine  Result Value Ref Range Status   Specimen Description   Final    IN/OUT CATH URINE Performed at [facility], [place] [address], [place], [place] [address]    Special Requests   Final    NONE Performed at [facility], [place] [address] [place], [place] [address]    Culture >=100,000 COLONIES/mL KLEBSIELLA PNEUMONIAE (A)  Final   Report Status 04/01/2021 FINAL  Final   Organism ID, Bacteria KLEBSIELLA PNEUMONIAE (A)  Final      Susceptibility   Klebsiella pneumoniae - MIC*    AMPICILLIN >=32 RESISTANT Resistant     CEFAZOLIN <=4 SENSITIVE Sensitive     CEFEPIME <=0.12 SENSITIVE Sensitive     CEFTRIAXONE <=0.25 SENSITIVE Sensitive     CIPROFLOXACIN <=0.25 SENSITIVE Sensitive     GENTAMICIN <=1 SENSITIVE Sensitive     IMIPENEM <=0.25 SENSITIVE Sensitive     NITROFURANTOIN 64 INTERMEDIATE Intermediate     TRIMETH/SULFA <=20  SENSITIVE Sensitive     AMPICILLIN/SULBACTAM <=2 SENSITIVE Sensitive     PIP/TAZO <=4 SENSITIVE Sensitive     * >=100,000 COLONIES/mL KLEBSIELLA PNEUMONIAE  Resp Panel by RT-PCR (Flu A&B, Covid)     Status: None   Collection Time: 04/03/21 10:56 AM  Result Value Ref Range Status   SARS Coronavirus 2 by RT PCR NEGATIVE NEGATIVE Final    Comment: (NOTE) SARS-CoV-2 target nucleic acids are NOT DETECTED.  The SARS-CoV-2 RNA is generally detectable in upper respiratory specimens during the acute phase of infection. The lowest concentration of SARS-CoV-2 viral copies this assay can detect is 138 copies/mL. A negative result does not preclude SARS-Cov-2 infection and should not be used as the sole basis for treatment or other patient management decisions. A negative result Bones occur with  improper specimen collection/handling, submission of specimen other than nasopharyngeal swab, presence of viral mutation(s) within the areas targeted by this assay, and inadequate number of viral copies(<138 copies/mL). A negative result must be combined with clinical observations, patient history, and epidemiological information. The expected result is Negative.  Fact Sheet for Patients:  EntrepreneurPulse.com.au  Fact Sheet for Healthcare Providers:  IncredibleEmployment.be  This test is no t yet approved or cleared by the [place] FDA and  has been authorized for detection and/or diagnosis of SARS-CoV-2 by FDA under an Emergency Use Authorization (EUA). This EUA will remain  in effect (meaning this test can be used) for the duration of the COVID-19 declaration under Section 564(b)(1) of the Act, 21 U.S.C.section 360bbb-3(b)(1), unless the authorization is terminated  or revoked sooner.       Influenza A by PCR NEGATIVE NEGATIVE Final   Influenza B by PCR NEGATIVE NEGATIVE Final    Comment: (NOTE) The Xpert Xpress SARS-CoV-2/FLU/RSV plus assay is intended  as an aid in the diagnosis of influenza from Nasopharyngeal swab specimens and should not be used as a sole basis for treatment. Nasal washings and aspirates are unacceptable for Xpert Xpress SARS-CoV-2/FLU/RSV testing.  Fact Sheet for Patients: EntrepreneurPulse.com.au  Fact Sheet for Healthcare Providers: IncredibleEmployment.be  This test is not yet approved or cleared by the [place] FDA and has been authorized for detection and/or diagnosis of SARS-CoV-2 by FDA under an Emergency Use Authorization (EUA). This EUA will remain in effect (meaning this test can be used) for the duration of the COVID-19 declaration under Section 564(b)(1) of the Act, 21 U.S.C. section 360bbb-3(b)(1), unless the authorization is terminated or revoked.  Performed at [facility], [place] [address], [place], [place] [address]   Culture, blood (routine x 2)     Status: None (Preliminary result)   Collection Time: 04/03/21 11:06 AM   Specimen: Left Antecubital; Blood  Result Value Ref Range Status   Specimen Description   Final    LEFT ANTECUBITAL Performed at [place] [address], [place], [place] [address]    Special Requests   Final    BOTTLES DRAWN AEROBIC AND ANAEROBIC Blood Culture adequate volume Performed at [place] [address], [place], [place] [address]    Culture   Final    NO GROWTH 2 DAYS Performed at [place] [address], [place], [place] [address]    Report Status PENDING  Incomplete  Culture, blood (routine x 2)     Status: None (Preliminary result)   Collection Time: 04/03/21  1:33 PM   Specimen: BLOOD  Result Value Ref Range Status   Specimen Description   Final    BLOOD LEFT ANTECUBITAL Performed at [place] [address] [place], [place] [address]    Special Requests   Final    BOTTLES DRAWN AEROBIC AND ANAEROBIC Blood  Culture results Salv[place] not be optimal due to an excessive volume of blood received in culture bottles Performed at [place] [address], [place], [place] [address]    Culture   Final    NO GROWTH 2 DAYS Performed at [place] [address], [place], [place] [address]    Report Status PENDING  Incomplete      Studies: No results found.  Scheduled Meds:  enoxaparin (LOVENOX) injection  40 mg Subcutaneous Q24H   insulin aspart  0-5 Units Subcutaneous QHS   insulin aspart  0-9 Units Subcutaneous TID WC   levothyroxine  75 mcg Oral QAC breakfast   phenytoin  200 mg Oral QHS   primidone  250 mg Oral Daily   saccharomyces boulardii  250 mg Oral BID   sodium bicarbonate  1,300 mg Oral QID   verapamil  210 mg Oral Daily    Continuous Infusions:  ampicillin-sulbactam (UNASYN) IV 1.5 g (04/05/21 1535)   sodium phosphate  Dextrose 5% IVPB 30 mmol (04/05/21 1640)     LOS: 2 days     Kayleen Memos, MD [facility] Pager [phone]  If 7PM-7AM, please contact night-coverage [email or web address] Password [facility] 04/05/2021, 4:58 PM

## 2021-04-05 NOTE — Evaluation (Signed)
Physical Therapy Evaluation Patient Details Name: [name] MRN: [id number] DOB: Nov 25, 1929 Today's Date: 04/05/2021  History of Present Illness  Pt is 87 yo female admitted on 04/03/21 with PNE, metabolic acidosis.  She was recently seen in ED for bronchitis and UTI.  Pt with hx including but not limited to seizure disorder, anxiety, R reast CA, CAD, Colon CA, DM, OA, OP, essential tremor, R THA 06/05/20.  Clinical Impression  Pt admitted with above diagnosis. At baseline, pt can transfer and ambulate short community distances with rollator.  She has caregivers during the day but alone at night.  Has had hx of frequent falls (reports when she is alone).  Pt now presents with further weakness.  She required mod A for transfers and only able to ambulate 15' at a time due to fatigue.  Pt with R LE weakness greater than L LE weaknes but reports this way since surgery in February.  Due to level of assist required and alone at night recommending SNF at d/c.  Pt would rather return home if able- discussed if home would need 24 hour care from someone able to provide mod A.  Pt currently with functional limitations due to the deficits listed below (see PT Problem List). Pt will benefit from skilled PT to increase their independence and safety with mobility to allow discharge to the venue listed below.          Recommendations for follow up therapy are one component of a multi-disciplinary discharge planning process, led by the attending physician.  Recommendations [place] be updated based on patient status, additional functional criteria and insurance authorization.  Follow Up Recommendations Skilled nursing-short term rehab (<3 hours/day)    Assistance Recommended at Discharge Frequent or constant Supervision/Assistance (needs assist at night too; has A during day)  Functional Status Assessment Patient has had a recent decline in their functional status and demonstrates the ability to make significant  improvements in function in a reasonable and predictable amount of time.  Equipment Recommendations  None recommended by PT    Recommendations for Other Services       Precautions / Restrictions Precautions Precautions: Fall      Mobility  Bed Mobility Overal bed mobility: Needs Assistance Bed Mobility: Supine to Sit     Supine to sit: Mod assist;HOB elevated     General bed mobility comments: Increased time with assist to scoot forward    Transfers Overall transfer level: Needs assistance Equipment used: Rolling walker (2 wheels) Transfers: Sit to/from Stand Sit to Stand: Mod assist           General transfer comment: Sit to stand from bed and toilet with mod A to rise and cues for hand placement    Ambulation/Gait Ambulation/Gait assistance: Min guard Gait Distance (Feet): 15 Feet (15'x2) Assistive device: Rolling walker (2 wheels) Gait Pattern/deviations: Step-to pattern;Decreased stride length;Narrow base of support;Shuffle Gait velocity: decreased     General Gait Details: Small steps, shuffle gait; min guard for safety with cues for RW; fatigued easily  Stairs            Wheelchair Mobility    Modified Rankin (Stroke Patients Only)       Balance Overall balance assessment: Needs assistance Sitting-balance support: No upper extremity supported Sitting balance-Leahy Scale: Fair     Standing balance support: Bilateral upper extremity supported Standing balance-Leahy Scale: Poor Standing balance comment: required UE support  Pertinent Vitals/Pain Pain Assessment: No/denies pain    Home Living Family/patient expects to be discharged to:: Private residence Living Arrangements: Alone Available Help at Discharge: Personal care attendant Type of Home: House Home Access: Stairs to enter Entrance Stairs-Rails: Right Entrance Stairs-Number of Steps: 4   Home Layout: One level Home Equipment: Charity fundraiser (4 wheels);Shower seat;BSC/3in1;Rolling Environmental consultant (2 wheels) Additional Comments: caregivers and family assist with IADLs and shower transfers    Prior Function               Mobility Comments: uses a rollator to ambulate; frequent falls ADLs Comments: mostly independent     Hand Dominance   Dominant Hand: Right    Extremity/Trunk Assessment   Upper Extremity Assessment Upper Extremity Assessment: Defer to OT evaluation    Lower Extremity Assessment Lower Extremity Assessment: LLE deficits/detail;RLE deficits/detail RLE Deficits / Details: ROM WFL; MMT 4/5 - reports weaker since sx LLE Deficits / Details: ROM WFL: MMT 4+/5    Cervical / Trunk Assessment Cervical / Trunk Assessment: Kyphotic  Communication   Communication: No difficulties  Cognition Arousal/Alertness: Awake/alert Behavior During Therapy: WFL for tasks assessed/performed Overall Cognitive Status: Within Functional Limits for tasks assessed                                 General Comments: slow to respond at times but overall functional        General Comments General comments (skin integrity, edema, etc.): VSS on RA; reports feels much weaker than baseline    Exercises     Assessment/Plan    PT Assessment Patient needs continued PT services  PT Problem List Decreased strength;Decreased mobility;Decreased safety awareness;Decreased range of motion;Decreased coordination;Decreased activity tolerance;Cardiopulmonary status limiting activity;Decreased balance;Decreased knowledge of use of DME       PT Treatment Interventions DME instruction;Therapeutic activities;Gait training;Therapeutic exercise;Patient/family education;Balance training;Functional mobility training    PT Goals (Current goals can be found in the Care Plan section)  Acute Rehab PT Goals Patient Stated Goal: return home PT Goal Formulation: With patient Time For Goal Achievement: 04/19/21 Potential to  Achieve Goals: Good    Frequency Min 2X/week   Barriers to discharge Decreased caregiver support      Co-evaluation PT/OT/SLP Co-Evaluation/Treatment: Yes Reason for Co-Treatment: For patient/therapist safety;Other (comment);Necessary to address cognition/behavior during functional activity (Pt was asleep(medication related); not overly willing to participate in pm with therapy so saw together to get both evals in) PT goals addressed during session: Mobility/safety with mobility OT goals addressed during session: ADL's and self-care       AM-PAC PT "6 Clicks" Mobility  Outcome Measure Help needed turning from your back to your side while in a flat bed without using bedrails?: A Little Help needed moving from lying on your back to sitting on the side of a flat bed without using bedrails?: A Lot Help needed moving to and from a bed to a chair (including a wheelchair)?: A Lot Help needed standing up from a chair using your arms (e.g., wheelchair or bedside chair)?: A Lot Help needed to walk in hospital room?: A Lot Help needed climbing 3-5 steps with a railing? : A Lot 6 Click Score: 13    End of Session Equipment Utilized During Treatment: Gait belt Activity Tolerance: Patient limited by fatigue Patient left: with chair alarm set;in chair;with call bell/phone within reach Nurse Communication: Mobility status PT Visit Diagnosis: Other abnormalities of gait and  mobility (R26.89);Muscle weakness (generalized) (M62.81);History of falling (Z91.81)    Time: 1497-0263 PT Time Calculation (min) (ACUTE ONLY): 33 min   Charges:   PT Evaluation $PT Eval Moderate Complexity: 1 Melina Schools, PT Acute Rehab Services Pager [phone] [facility] [place] 04/05/2021, 3:17 PM

## 2021-04-05 NOTE — Evaluation (Signed)
Occupational Therapy Evaluation Patient Details Name: [name] MRN: [id number] DOB: 1929/10/19 Today's Date: 04/05/2021   History of Present Illness Pt is 87 yo female admitted on 04/03/21 with PNE, metabolic acidosis.  She was recently seen in ED for bronchitis and UTI.  Pt with hx including but not limited to seizure disorder, anxiety, R reast CA, CAD, Colon CA, DM, OA, OP, essential tremor, R THA 06/05/20.   Clinical Impression   [name] is an 87 year old woman who presents with generalized weakness, decreased activity tolerance and impaired balance resulting in a decline in her functional abilities. One evaluation she is mod assist for bed transfers and standing and min guard to ambulate short distance in room. She is needing increased assistance for LB ADLs and toileting. She required encouragement to participate. Patient will benefit from skilled OT services while in hospital to improve deficits and learn compensatory strategies as needed in order to return to PLOF.  Recommend short term rehab at discharge prior to discharge home due to impairments and lack of assistance at night.      Recommendations for follow up therapy are one component of a multi-disciplinary discharge planning process, led by the attending physician.  Recommendations [place] be updated based on patient status, additional functional criteria and insurance authorization.   Follow Up Recommendations  Skilled nursing-short term rehab (<3 hours/day)    Assistance Recommended at Discharge Frequent or constant Supervision/Assistance  Functional Status Assessment  Patient has had a recent decline in their functional status and demonstrates the ability to make significant improvements in function in a reasonable and predictable amount of time.  Equipment Recommendations   (TBD)    Recommendations for Other Services       Precautions / Restrictions Precautions Precautions: Fall Restrictions Weight Bearing  Restrictions: No      Mobility Bed Mobility Overal bed mobility: Needs Assistance Bed Mobility: Supine to Sit     Supine to sit: Mod assist;HOB elevated     General bed mobility comments: Increased time with assist to scoot forward    Transfers Overall transfer level: Needs assistance Equipment used: Rolling walker (2 wheels) Transfers: Sit to/from Stand Sit to Stand: Mod assist           General transfer comment: Sit to stand from bed and toilet with mod A to rise and cues for hand placement      Balance Overall balance assessment: Needs assistance Sitting-balance support: No upper extremity supported Sitting balance-Leahy Scale: Fair     Standing balance support: Bilateral upper extremity supported Standing balance-Leahy Scale: Poor Standing balance comment: required UE support                           ADL either performed or assessed with clinical judgement   ADL Overall ADL's : Needs assistance/impaired Eating/Feeding: Independent   Grooming: Set up;Sitting   Upper Body Bathing: Set up;Sitting   Lower Body Bathing: Minimal assistance;Sit to/from stand   Upper Body Dressing : Set up;Sitting   Lower Body Dressing: Moderate assistance;Sit to/from stand   Toilet Transfer: Moderate assistance;Rolling walker (2 wheels);Regular Toilet;Grab bars Toilet Transfer Details (indicate cue type and reason): mod assist to rise from toilet Toileting- Clothing Manipulation and Hygiene: Maximal assistance Toileting - Clothing Manipulation Details (indicate cue type and reason): for cleaning after BM and clothing management     Functional mobility during ADLs: Min guard;Rolling walker (2 wheels)  Vision Patient Visual Report: No change from baseline       Perception     Praxis      Pertinent Vitals/Pain Pain Assessment: No/denies pain     Hand Dominance Right   Extremity/Trunk Assessment Upper Extremity Assessment Upper Extremity  Assessment: Overall WFL for tasks assessed   Lower Extremity Assessment Lower Extremity Assessment: Defer to PT evaluation RLE Deficits / Details: ROM WFL; MMT 4/5 - reports weaker since sx LLE Deficits / Details: ROM WFL: MMT 4+/5   Cervical / Trunk Assessment Cervical / Trunk Assessment: Kyphotic   Communication Communication Communication: No difficulties   Cognition Arousal/Alertness: Awake/alert Behavior During Therapy: WFL for tasks assessed/performed Overall Cognitive Status: Within Functional Limits for tasks assessed                                 General Comments: slow to respond at times but overall functional     General Comments  VSS on RA; reports feels much weaker than baseline    Exercises     Shoulder Instructions      Home Living Family/patient expects to be discharged to:: Private residence Living Arrangements: Alone Available Help at Discharge: Personal care attendant Type of Home: House Home Access: Stairs to enter [employer] of Steps: 4 Entrance Stairs-Rails: Right Home Layout: One level     Bathroom Shower/Tub: Occupational psychologist: Standard     Home Equipment: Health and safety inspector (4 wheels);Shower seat;BSC/3in1;Rolling Environmental consultant (2 wheels)   Additional Comments: caregivers and family assist with IADLs and shower transfers      Prior Functioning/Environment               Mobility Comments: uses a rollator to ambulate; frequent falls ADLs Comments: mostly independent        OT Problem List: Decreased strength;Decreased activity tolerance;Impaired balance (sitting and/or standing);Decreased safety awareness;Decreased knowledge of use of DME or AE      OT Treatment/Interventions: Self-care/ADL training;Therapeutic exercise;DME and/or AE instruction;Therapeutic activities;Balance training;Patient/family education    OT Goals(Current goals can be found in the care plan section) Acute Rehab  OT Goals Patient Stated Goal: to go home OT Goal Formulation: With patient Time For Goal Achievement: 04/19/21 Potential to Achieve Goals: Good  OT Frequency: Min 2X/week   Barriers to D/C:            Co-evaluation PT/OT/SLP Co-Evaluation/Treatment: Yes Reason for Co-Treatment: For patient/therapist safety;Necessary to address cognition/behavior during functional activity (lack of motivation to participate) PT goals addressed during session: Mobility/safety with mobility OT goals addressed during session: ADL's and self-care      AM-PAC OT "6 Clicks" Daily Activity     Outcome Measure Help from another person eating meals?: None Help from another person taking care of personal grooming?: A Little Help from another person toileting, which includes using toliet, bedpan, or urinal?: A Lot Help from another person bathing (including washing, rinsing, drying)?: A Little Help from another person to put on and taking off regular upper body clothing?: A Little Help from another person to put on and taking off regular lower body clothing?: A Lot 6 Click Score: 17   End of Session Equipment Utilized During Treatment: Gait belt;Rolling walker (2 wheels) Nurse Communication: Mobility status (Okay to see per RN)  Activity Tolerance: Patient limited by fatigue Patient left: in chair;with call bell/phone within reach;with chair alarm set  OT Visit Diagnosis: Muscle weakness (generalized) (M62.81);Unsteadiness  on feet (R26.81);Repeated falls (R29.6)                Time: 7005-2591 OT Time Calculation (min): 35 min Charges:  OT General Charges $OT Visit: 1 Visit OT Evaluation $OT Eval Low Complexity: 1 Low  Ladarrian Asencio, OTR/L [place] [phone] Pager: [place] 04/05/2021, 3:57 PM

## 2021-04-06 LAB — BASIC METABOLIC PANEL
Anion gap: 6 (ref 5–15)
BUN: <5 mg/dL — ABNORMAL LOW (ref 8–23)
CO2: 21 mmol/L — ABNORMAL LOW (ref 22–32)
Calcium: 7.5 mg/dL — ABNORMAL LOW (ref 8.9–10.3)
Chloride: 103 mmol/L (ref 98–111)
Creatinine, Ser: 0.55 mg/dL (ref 0.44–1.00)
GFR, Estimated: >60 mL/min (ref >60)
Glucose, Bld: 187 mg/dL — ABNORMAL HIGH (ref 70–99)
Potassium: 3.1 mmol/L — ABNORMAL LOW (ref 3.5–5.1)
Sodium: 130 mmol/L — ABNORMAL LOW (ref 135–145)

## 2021-04-06 LAB — CBC
HCT: 28.2 % — ABNORMAL LOW (ref 36.0–46.0)
Hemoglobin: 9.9 g/dL — ABNORMAL LOW (ref 12.0–15.0)
MCH: 31.4 pg (ref 26.0–34.0)
MCHC: 35.1 g/dL (ref 30.0–36.0)
MCV: 89.5 fL (ref 80.0–100.0)
Platelets: 406 10*3/uL — ABNORMAL HIGH (ref 150–400)
RBC: 3.15 MIL/uL — ABNORMAL LOW (ref 3.87–5.11)
RDW: 14.2 % (ref 11.5–15.5)
WBC: 7.5 10*3/uL (ref 4.0–10.5)
nRBC: 0 % (ref 0.0–0.2)

## 2021-04-06 LAB — PHOSPHORUS: Phosphorus: 2.9 mg/dL (ref 2.5–4.6)

## 2021-04-06 LAB — GLUCOSE, CAPILLARY
Glucose-Capillary: 193 mg/dL — ABNORMAL HIGH (ref 70–99)
Glucose-Capillary: 225 mg/dL — ABNORMAL HIGH (ref 70–99)
Glucose-Capillary: 124 mg/dL — ABNORMAL HIGH (ref 70–99)
Glucose-Capillary: 218 mg/dL — ABNORMAL HIGH (ref 70–99)
Glucose-Capillary: 90 mg/dL (ref 70–99)
Glucose-Capillary: 98 mg/dL (ref 70–99)

## 2021-04-06 LAB — PROCALCITONIN: Procalcitonin: <0.1 ng/mL

## 2021-04-06 MED ORDER — CYANOCOBALAMIN 1000 MCG/ML IJ SOLN
1000.0000 ug | INTRAMUSCULAR | Status: DC
Start: 2021-04-06 — End: 2021-04-08
  Administered 2021-04-06: 16:00:00 1000 ug via INTRAMUSCULAR
  Filled 2021-04-06: qty 1

## 2021-04-06 MED ORDER — BENZONATATE 100 MG PO CAPS: 200.0000 mg | ORAL_CAPSULE | Freq: Two times a day (BID) | ORAL | Status: DC | PRN

## 2021-04-06 MED ORDER — CHOLESTYRAMINE LIGHT 4 G PO PACK
4.0000 g | PACK | Freq: Two times a day (BID) | ORAL | Status: DC
  Administered 2021-04-06 – 2021-04-08 (×5): 4 g via ORAL
  Filled 2021-04-06 (×5): qty 1

## 2021-04-06 MED ORDER — VITAMIN D 25 MCG (1000 UNIT) PO TABS
5000.0000 [IU] | ORAL_TABLET | Freq: Every day | ORAL | Status: DC
  Administered 2021-04-06 – 2021-04-08 (×3): 5000 [IU] via ORAL
  Filled 2021-04-06 (×3): qty 5

## 2021-04-06 MED ORDER — POLYVINYL ALCOHOL 1.4 % OP SOLN
2.0000 [drp] | Freq: Three times a day (TID) | OPHTHALMIC | Status: DC
  Administered 2021-04-06 – 2021-04-08 (×7): 2 [drp] via OPHTHALMIC
  Filled 2021-04-06 (×2): qty 15

## 2021-04-06 MED ORDER — POTASSIUM CHLORIDE CRYS ER 20 MEQ PO TBCR
40.0000 meq | EXTENDED_RELEASE_TABLET | Freq: Two times a day (BID) | ORAL | Status: AC
  Administered 2021-04-06 (×2): 40 meq via ORAL
  Filled 2021-04-06 (×2): qty 2

## 2021-04-06 MED ORDER — TRANDOLAPRIL 2 MG PO TABS
2.0000 mg | ORAL_TABLET | Freq: Every day | ORAL | Status: DC
  Administered 2021-04-06 – 2021-04-08 (×3): 2 mg via ORAL
  Filled 2021-04-06 (×3): qty 1

## 2021-04-06 NOTE — Progress Notes (Signed)
FYI--pt's nephew is here to visit pt. He is very adamant that pt cannot go back home. He is in agreement that pt needs rehab/SNF. He is here to talk to pt about this concern, as pt insists she is going home on d/c.

## 2021-04-06 NOTE — Progress Notes (Signed)
PROGRESS NOTE  [name] PQZ:[id number] DOB: 08/07/1929 DOA: 04/03/2021 PCP: Bartholome Bill, MD  HPI/Recap of past 24 hours: [name] is a 87 y.o. female with medical history significant for seizure disorder, gait abnormality, essential tremor, who presented to [facility] ED from home due to worsening productive cough and generalized weakness of 1 week duration.  She states she started coughing about a month ago and it has gradually been worsening.  Associated with subjective fevers and chills for the past 2 days.  She presented to the ED at [facility] on 03/30/2021 for cough and generalized body aches, was diagnosed with bronchitis and UTI.  Received symptomatic treatment of bronchitis in the ED and was discharged from the ED with prescription for Macrobid 100 mg twice daily x5 days for presumptive UTI.  Urine culture returned resistant to Macrobid, switched to Bactrim which she took yesterday and this morning.  She presents today with persistent cough and generalized weakness.  Lives alone,-difficult to take care of her activities of daily living due to her acute illness.  Work-up in the ED reveals multifocal pneumonia seen on chest x-ray for which she received a dose of doxycycline in the ED.  Also showing electrolyte abnormalities including hyponatremia, hypokalemia, hypomagnesemia, metabolic acidosis with serum bicarb of 18.  COVID-19 and respiratory viral panel negative.  TRH, hospitalist service was asked to admit.   ED Course: Afebrile, T-max 97.8.  BP 137/76, pulse 71, respiration rate 14, O2 saturation 100% on room air.  Lab studies remarkable for WBC 13.1, hemoglobin 11.3, MCV 93, platelet count 313.  Neutrophil count 9.8.  Serum sodium 128, potassium 3.1, magnesium 1.6.  Serum bicarb 18, serum glucose 215, calcium 8.0, albumin 3.4.  04/06/2021: Seen and examined at her bedside.  No new complaints.  No acute events overnight.  Patient has declined SNF recommended by PT OT.   Anticipate discharge to home with home health services on 04/07/21.  I will call her sister for updates.   Assessment/Plan: Principal Problem:   CAP (community acquired pneumonia)  Multifocal community-acquired pneumonia, POA Productive cough x1 week, subjective fevers and chills. Personally reviewed chest x-ray done on admission which shows bilateral pulmonary infiltrates suggestive of multifocal pneumonia. Patient has multiple drug allergies more than 60. She received p.o. doxycycline in the ED x1 dose. Pharmacy consulted to assist with antibiotic choice for which the patient is not allergic to. Received Unasyn and Benadryl, tolerated well. Continue Unasyn Leukocytosis has resolved. Obtain sputum culture if able Gentle IV fluid hydration Continue bronchodilators and pulmonary toilet.  Mobilize as tolerated  Resolving Non anion gap metabolic acidosis Serum bicarb 17>> 21. Anion gap 4 Continue sodium bicarb p.o. Repeat BMP in the morning  Resolved refractory hypophosphatemia Replaced intravenously x2  Refractory hypokalemia, post repletion. Serum potassium 3.1 Repleted orally  Resolved hypomagnesemia, post repletion Magnesium 2.0 from 1.6   Type 2 diabetes with hyperglycemia Hemoglobin A1c 6.6 on 04/03/2021. Continue insulin sliding scale   Hypovolemic hyponatremia Serum sodium 128> 129. Continue to encourage increase of oral solute intake  Electrolytes disturbances: Hypokalemia, hypomagnesemia Serum potassium 3.1, repleted orally Serum magnesium 1.6, repleted intravenously   Seizure disorder Continue home regimen Seizure precautions   Essential tremors Continue home regimen Fall precautions   Gait abnormality Uses a walker at baseline. PT OT assessed and recommended SNF TOC consulted to assist with SNF placement.  Continue fall precautions Patient has declined SNF   Moderate protein calorie malnutrition Serum albumin 3.4 BMI 22 Continue  to encourage  oral protein calorie intake Oral supplement twice daily  Likely antibiotics induced loose stools Continue Florastor 04/05/2021 Imodium as needed     DVT prophylaxis: Subcu enoxaparin daily   Code Status: Full code   Family Communication: None at bedside.   Disposition Plan: Admit to telemetry unit at [place] called: None.     Objective: Vitals:   04/05/21 0900 04/05/21 2017 04/06/21 0558 04/06/21 1114  BP:  (!) 152/76 (!) 151/83 138/73  Pulse:  86 83 86  Resp:  20 14   Temp:  97.8 F (36.6 C) 98.1 F (36.7 C)   TempSrc:  Oral Oral   SpO2: 97% 97% 98% 97%  Weight:      Height:       No intake or output data in the 24 hours ending 04/06/21 1553  Filed Weights   04/03/21 1359  Weight: 65.2 kg    Exam:  General: 87 y.o. year-old female frail-appearing no acute distress.  She is alert and oriented x3.   Cardiovascular: Regular rate and rhythm no rubs or gallops. Respiratory: Clear to auscultation no wheezes or rales.  Abdomen: Soft nontender normal bowel sounds present. Musculoskeletal: No lower extremity edema bilaterally. Skin: No ulcerative lesions noted. Psychiatry: Mood is appropriate for condition and setting.  Data Reviewed: CBC: Recent Labs  Lab 03/30/21 2230 04/03/21 1106 04/04/21 0500 04/06/21 0545  WBC 8.5 13.1* 9.1 7.5  NEUTROABS 6.9 9.8*  --   --   HGB 11.2* 11.3* 9.8* 9.9*  HCT 33.1* 33.9* 28.9* 28.2*  MCV 92.2 93.4 92.0 89.5  PLT 238 313 325 096*   Basic Metabolic Panel: Recent Labs  Lab 03/30/21 2230 04/03/21 1106 04/03/21 1156 04/04/21 0500 04/05/21 0605 04/06/21 0545  NA 130* 128*  --  129* 129* 130*  K 3.7 3.1*  --  4.7 3.6 3.1*  CL 104 102  --  108 106 103  CO2 18* 18*  --  17* 18* 21*  GLUCOSE 229* 218*  --  208* 178* 187*  BUN 9 9  --  8 6* <5*  CREATININE 0.68 0.86  --  0.63 0.62 0.55  CALCIUM 8.3* 8.0*  --  7.7* 7.6* 7.5*  MG  --   --  1.6* 2.0 1.9  --   PHOS  --   --   --  1.2* 1.6* 2.9    GFR: Estimated Creatinine Clearance: 47.3 mL/min (by C-G formula based on SCr of 0.55 mg/dL). Liver Function Tests: Recent Labs  Lab 03/30/21 2230 04/03/21 1106 04/04/21 0500  AST 14* 16 14*  ALT 14 15 15   ALKPHOS 76 112 112  BILITOT 0.5 0.5 0.7  PROT 6.5 6.8 5.6*  ALBUMIN 3.6 3.4* 2.9*   No results for input(s): LIPASE, AMYLASE in the last 168 hours. No results for input(s): AMMONIA in the last 168 hours. Coagulation Profile: No results for input(s): INR, PROTIME in the last 168 hours. Cardiac Enzymes: No results for input(s): CKTOTAL, CKMB, CKMBINDEX, TROPONINI in the last 168 hours. BNP (last 3 results) No results for input(s): PROBNP in the last 8760 hours. HbA1C: No results for input(s): HGBA1C in the last 72 hours.  CBG: Recent Labs  Lab 04/05/21 1645 04/05/21 2020 04/06/21 0751 04/06/21 1114 04/06/21 1542  GLUCAP 202* 254* 193* 225* 124*   Lipid Profile: No results for input(s): CHOL, HDL, LDLCALC, TRIG, CHOLHDL, LDLDIRECT in the last 72 hours. Thyroid Function Tests: No results for input(s): TSH, T4TOTAL, FREET4,  T3FREE, THYROIDAB in the last 72 hours. Anemia Panel: No results for input(s): VITAMINB12, FOLATE, FERRITIN, TIBC, IRON, RETICCTPCT in the last 72 hours. Urine analysis:    Component Value Date/Time   COLORURINE YELLOW 04/03/2021 [place] 04/03/2021 1511   LABSPEC 1.013 04/03/2021 1511   PHURINE 5.0 04/03/2021 1511   GLUCOSEU NEGATIVE 04/03/2021 1511   GLUCOSEU NEGATIVE 12/28/2013 1036   HGBUR NEGATIVE 04/03/2021 1511   HGBUR trace-intact 06/29/2007 1304   BILIRUBINUR NEGATIVE 04/03/2021 1511   BILIRUBINUR negative 01/19/2017 1049   KETONESUR 20 (A) 04/03/2021 1511   PROTEINUR 30 (A) 04/03/2021 1511   UROBILINOGEN 0.2 01/19/2017 1049   UROBILINOGEN 1.0 03/25/2015 1755   NITRITE NEGATIVE 04/03/2021 1511   LEUKOCYTESUR NEGATIVE 04/03/2021 1511   Sepsis Labs: @LABRCNTIP (procalcitonin:4,lacticidven:4)  ) Recent  Results (from the past 240 hour(s))  Resp Panel by RT-PCR (Flu A&B, Covid) Nasopharyngeal Swab     Status: None   Collection Time: 03/30/21 10:40 PM   Specimen: Nasopharyngeal Swab; Nasopharyngeal(NP) swabs in vial transport medium  Result Value Ref Range Status   SARS Coronavirus 2 by RT PCR NEGATIVE NEGATIVE Final    Comment: (NOTE) SARS-CoV-2 target nucleic acids are NOT DETECTED.  The SARS-CoV-2 RNA is generally detectable in upper respiratory specimens during the acute phase of infection. The lowest concentration of SARS-CoV-2 viral copies this assay can detect is 138 copies/mL. A negative result does not preclude SARS-Cov-2 infection and should not be used as the sole basis for treatment or other patient management decisions. A negative result [place] occur with  improper specimen collection/handling, submission of specimen other than nasopharyngeal swab, presence of viral mutation(s) within the areas targeted by this assay, and inadequate number of viral copies(<138 copies/mL). A negative result must be combined with clinical observations, patient history, and epidemiological information. The expected result is Negative.  Fact Sheet for Patients:  EntrepreneurPulse.com.au  Fact Sheet for Healthcare Providers:  IncredibleEmployment.be  This test is no t yet approved or cleared by the [place] FDA and  has been authorized for detection and/or diagnosis of SARS-CoV-2 by FDA under an Emergency Use Authorization (EUA). This EUA will remain  in effect (meaning this test can be used) for the duration of the COVID-19 declaration under Section 564(b)(1) of the Act, 21 U.S.C.section 360bbb-3(b)(1), unless the authorization is terminated  or revoked sooner.       Influenza A by PCR NEGATIVE NEGATIVE Final   Influenza B by PCR NEGATIVE NEGATIVE Final    Comment: (NOTE) The Xpert Xpress SARS-CoV-2/FLU/RSV plus assay is intended as an aid in the  diagnosis of influenza from Nasopharyngeal swab specimens and should not be used as a sole basis for treatment. Nasal washings and aspirates are unacceptable for Xpert Xpress SARS-CoV-2/FLU/RSV testing.  Fact Sheet for Patients: EntrepreneurPulse.com.au  Fact Sheet for Healthcare Providers: IncredibleEmployment.be  This test is not yet approved or cleared by the [place] FDA and has been authorized for detection and/or diagnosis of SARS-CoV-2 by FDA under an Emergency Use Authorization (EUA). This EUA will remain in effect (meaning this test can be used) for the duration of the COVID-19 declaration under Section 564(b)(1) of the Act, 21 U.S.C. section 360bbb-3(b)(1), unless the authorization is terminated or revoked.  Performed at [facility], [place] [address] [place], [place] [address]   Urine Culture     Status: Abnormal   Collection Time: 03/31/21  2:38 AM   Specimen: In/Out Cath Urine  Result Value Ref Range Status  Specimen Description   Final    IN/OUT CATH URINE Performed at [place] [address], [place], [place] [address]    Special Requests   Final    NONE Performed at [facility], [place] [address], [place], [place] [address]    Culture >=100,000 COLONIES/mL KLEBSIELLA PNEUMONIAE (A)  Final   Report Status 04/01/2021 FINAL  Final   Organism ID, Bacteria KLEBSIELLA PNEUMONIAE (A)  Final      Susceptibility   Klebsiella pneumoniae - MIC*    AMPICILLIN >=32 RESISTANT Resistant     CEFAZOLIN <=4 SENSITIVE Sensitive     CEFEPIME <=0.12 SENSITIVE Sensitive     CEFTRIAXONE <=0.25 SENSITIVE Sensitive     CIPROFLOXACIN <=0.25 SENSITIVE Sensitive     GENTAMICIN <=1 SENSITIVE Sensitive     IMIPENEM <=0.25 SENSITIVE Sensitive     NITROFURANTOIN 64 INTERMEDIATE Intermediate     TRIMETH/SULFA <=20 SENSITIVE Sensitive     AMPICILLIN/SULBACTAM <=2 SENSITIVE Sensitive      PIP/TAZO <=4 SENSITIVE Sensitive     * >=100,000 COLONIES/mL KLEBSIELLA PNEUMONIAE  Resp Panel by RT-PCR (Flu A&B, Covid)     Status: None   Collection Time: 04/03/21 10:56 AM  Result Value Ref Range Status   SARS Coronavirus 2 by RT PCR NEGATIVE NEGATIVE Final    Comment: (NOTE) SARS-CoV-2 target nucleic acids are NOT DETECTED.  The SARS-CoV-2 RNA is generally detectable in upper respiratory specimens during the acute phase of infection. The lowest concentration of SARS-CoV-2 viral copies this assay can detect is 138 copies/mL. A negative result does not preclude SARS-Cov-2 infection and should not be used as the sole basis for treatment or other patient management decisions. A negative result [place] occur with  improper specimen collection/handling, submission of specimen other than nasopharyngeal swab, presence of viral mutation(s) within the areas targeted by this assay, and inadequate number of viral copies(<138 copies/mL). A negative result must be combined with clinical observations, patient history, and epidemiological information. The expected result is Negative.  Fact Sheet for Patients:  EntrepreneurPulse.com.au  Fact Sheet for Healthcare Providers:  IncredibleEmployment.be  This test is no t yet approved or cleared by the [place] FDA and  has been authorized for detection and/or diagnosis of SARS-CoV-2 by FDA under an Emergency Use Authorization (EUA). This EUA will remain  in effect (meaning this test can be used) for the duration of the COVID-19 declaration under Section 564(b)(1) of the Act, 21 U.S.C.section 360bbb-3(b)(1), unless the authorization is terminated  or revoked sooner.       Influenza A by PCR NEGATIVE NEGATIVE Final   Influenza B by PCR NEGATIVE NEGATIVE Final    Comment: (NOTE) The Xpert Xpress SARS-CoV-2/FLU/RSV plus assay is intended as an aid in the diagnosis of influenza from Nasopharyngeal swab  specimens and should not be used as a sole basis for treatment. Nasal washings and aspirates are unacceptable for Xpert Xpress SARS-CoV-2/FLU/RSV testing.  Fact Sheet for Patients: EntrepreneurPulse.com.au  Fact Sheet for Healthcare Providers: IncredibleEmployment.be  This test is not yet approved or cleared by the [place] FDA and has been authorized for detection and/or diagnosis of SARS-CoV-2 by FDA under an Emergency Use Authorization (EUA). This EUA will remain in effect (meaning this test can be used) for the duration of the COVID-19 declaration under Section 564(b)(1) of the Act, 21 U.S.C. section 360bbb-3(b)(1), unless the authorization is terminated or revoked.  Performed at [facility], [place] [address], [place], [place] [address]  Culture, blood (routine x 2)     Status: None (Preliminary result)   Collection Time: 04/03/21 11:06 AM   Specimen: Left Antecubital; Blood  Result Value Ref Range Status   Specimen Description   Final    LEFT ANTECUBITAL Performed at [place] [address] [place], [place] [address]    Special Requests   Final    BOTTLES DRAWN AEROBIC AND ANAEROBIC Blood Culture adequate volume Performed at [place] [address] [place], [place] [address]    Culture   Final    NO GROWTH 3 DAYS Performed at [facility], [place] [address], [place], [place] [address]    Report Status PENDING  Incomplete  Culture, blood (routine x 2)     Status: None (Preliminary result)   Collection Time: 04/03/21  1:33 PM   Specimen: BLOOD  Result Value Ref Range Status   Specimen Description   Final    BLOOD LEFT ANTECUBITAL Performed at [place] [address] [place], [place] [address]    Special Requests   Final    BOTTLES DRAWN AEROBIC AND ANAEROBIC Blood Culture results Mould not be optimal due to an excessive volume of  blood received in culture bottles Performed at [place] [address] [place], [place] [address]    Culture   Final    NO GROWTH 3 DAYS Performed at [facility], [place] [address], [place], [place] [address]    Report Status PENDING  Incomplete  Respiratory (~20 pathogens) panel by PCR     Status: None   Collection Time: 04/05/21  9:59 AM  Result Value Ref Range Status   Adenovirus NOT DETECTED NOT DETECTED Final   Coronavirus 229E NOT DETECTED NOT DETECTED Final    Comment: (NOTE) The Coronavirus on the Respiratory Panel, DOES NOT test for the novel  Coronavirus (2019 nCoV)    Coronavirus HKU1 NOT DETECTED NOT DETECTED Final   Coronavirus NL63 NOT DETECTED NOT DETECTED Final   Coronavirus OC43 NOT DETECTED NOT DETECTED Final   Metapneumovirus NOT DETECTED NOT DETECTED Final   Rhinovirus / Enterovirus NOT DETECTED NOT DETECTED Final   Influenza A NOT DETECTED NOT DETECTED Final   Influenza B NOT DETECTED NOT DETECTED Final   Parainfluenza Virus 1 NOT DETECTED NOT DETECTED Final   Parainfluenza Virus 2 NOT DETECTED NOT DETECTED Final   Parainfluenza Virus 3 NOT DETECTED NOT DETECTED Final   Parainfluenza Virus 4 NOT DETECTED NOT DETECTED Final   Respiratory Syncytial Virus NOT DETECTED NOT DETECTED Final   Bordetella pertussis NOT DETECTED NOT DETECTED Final   Bordetella Parapertussis NOT DETECTED NOT DETECTED Final   Chlamydophila pneumoniae NOT DETECTED NOT DETECTED Final   Mycoplasma pneumoniae NOT DETECTED NOT DETECTED Final    Comment: Performed at [facility], [place]. [address], [place], [place] [address]      Studies: No results found.  Scheduled Meds:  cholecalciferol  5,000 Units Oral Daily   cholestyramine light  4 g Oral BID   cyanocobalamin  1,000 mcg Intramuscular Q21 days   enoxaparin (LOVENOX) injection  40 mg Subcutaneous Q24H   insulin aspart  0-5 Units Subcutaneous QHS   insulin aspart  0-9 Units Subcutaneous TID WC    levothyroxine  75 mcg Oral QAC breakfast   phenytoin  200 mg Oral QHS   polyvinyl alcohol  2 drop Both Eyes TID   potassium chloride  40 mEq Oral  BID   primidone  250 mg Oral Daily   saccharomyces boulardii  250 mg Oral BID   sodium bicarbonate  1,300 mg Oral QID   trandolapril  2 mg Oral Daily   verapamil  210 mg Oral Daily    Continuous Infusions:  ampicillin-sulbactam (UNASYN) IV 1.5 g (04/06/21 1539)     LOS: 3 days     Kayleen Memos, MD [facility] Pager [phone]  If 7PM-7AM, please contact night-coverage [email or web address] Password [facility] 04/06/2021, 3:53 PM

## 2021-04-07 LAB — BASIC METABOLIC PANEL
Anion gap: 5 (ref 5–15)
BUN: <5 mg/dL — ABNORMAL LOW (ref 8–23)
CO2: 21 mmol/L — ABNORMAL LOW (ref 22–32)
Calcium: 7.6 mg/dL — ABNORMAL LOW (ref 8.9–10.3)
Chloride: 106 mmol/L (ref 98–111)
Creatinine, Ser: 0.56 mg/dL (ref 0.44–1.00)
GFR, Estimated: >60 mL/min (ref >60)
Glucose, Bld: 166 mg/dL — ABNORMAL HIGH (ref 70–99)
Potassium: 4.1 mmol/L (ref 3.5–5.1)
Sodium: 132 mmol/L — ABNORMAL LOW (ref 135–145)

## 2021-04-07 LAB — RESP PANEL BY RT-PCR (FLU A&B, COVID) ARPGX2
Influenza A by PCR: NEGATIVE
Influenza B by PCR: NEGATIVE
SARS Coronavirus 2 by RT PCR: NEGATIVE

## 2021-04-07 LAB — GLUCOSE, CAPILLARY
Glucose-Capillary: 164 mg/dL — ABNORMAL HIGH (ref 70–99)
Glucose-Capillary: 149 mg/dL — ABNORMAL HIGH (ref 70–99)
Glucose-Capillary: 195 mg/dL — ABNORMAL HIGH (ref 70–99)
Glucose-Capillary: 242 mg/dL — ABNORMAL HIGH (ref 70–99)

## 2021-04-07 MED ORDER — ALPRAZOLAM 0.25 MG PO TABS: 0.2500 mg | ORAL_TABLET | Freq: Three times a day (TID) | ORAL | 0 refills | Status: AC | PRN

## 2021-04-07 MED ORDER — TRAMADOL HCL 50 MG PO TABS: 50.0000 mg | ORAL_TABLET | Freq: Two times a day (BID) | ORAL | 0 refills | Status: AC | PRN

## 2021-04-07 MED ORDER — CALCIUM GLUCONATE-NACL 1-0.675 GM/50ML-% IV SOLN
1.0000 g | Freq: Once | INTRAVENOUS | Status: AC
  Administered 2021-04-07: 1000 mg via INTRAVENOUS
  Filled 2021-04-07: qty 50

## 2021-04-07 MED ORDER — SODIUM CHLORIDE 0.9 % IV SOLN
1.5000 g | Freq: Four times a day (QID) | INTRAVENOUS | Status: AC
  Administered 2021-04-07 (×3): 1.5 g via INTRAVENOUS
  Filled 2021-04-07 (×2): qty 4
  Filled 2021-04-07: qty 1.5

## 2021-04-07 MED ORDER — TRAMADOL HCL 50 MG PO TABS
50.0000 mg | ORAL_TABLET | Freq: Once | ORAL | Status: AC
  Administered 2021-04-07: 50 mg via ORAL
  Filled 2021-04-07: qty 1

## 2021-04-07 NOTE — Care Management Important Message (Signed)
Important Message  Patient Details IM Letter placed in Patients room. Name: [name] MRN: [id number] Date of Birth: 1929/12/25   Medicare Important Message Given:  Yes     Kerin Salen 04/07/2021, 12:38 PM

## 2021-04-07 NOTE — NC FL2 (Signed)
[place] LEVEL OF CARE SCREENING TOOL     IDENTIFICATION  Patient Name: [name] Birthdate: 25-Aug-1929 Sex: female Admission Date (Current Location): 04/03/2021  [facility] and [place] Number:  Herbalist and Address:  [facility],  [place], [place]      Provider Number: [id number]  Attending Physician Name and Address:  Kayleen Memos, DO  Relative Name and Phone Number:  Emelda Fear (Niece)   [phone]    Current Level of Care: SNF Recommended Level of Care: [place] Prior Approval Number:    Date Approved/Denied:   PASRR Number: [id number]  Discharge Plan: SNF    Current Diagnoses: Patient Active Problem List   Diagnosis Date Noted   CAP (community acquired pneumonia) 04/03/2021   Compression fracture of body of thoracic vertebra ([place]) 08/21/2020   Dilantin toxicity 08/17/2020   Postoperative anemia due to acute blood loss 06/06/2020   Leukocytosis    Closed right hip fracture ([place]) 06/04/2020   Constipation 05/27/2020   Dyslipidemia 05/27/2020   Enterocolitis due to Clostridium difficile, not specified as recurrent 05/27/2020   Pleural effusion on right 04/01/2020   Acute respiratory failure with hypoxia ([place]) 04/01/2020   Pneumonia of right lung due to infectious organism 04/01/2020   Uncontrolled type 2 diabetes mellitus with hyperglycemia, with long-term current use of insulin ([place]) 04/01/2020   Abnormal CT of the chest 04/01/2020   Subacute pulmonary embolism ([place]) 40/97/3532   Acute metabolic encephalopathy 99/24/2683   Adenocarcinoma of colon ([place]) 04/01/2020   Nausea & vomiting 03/12/2020   Cancer of ascending colon pT3pN0 (0/12 LN) s/p lap right colectomy 03/06/2020 03/09/2020   History of multiple allergies 03/09/2020   S/P right hemicolectomy 03/06/2020   Closed fracture of shaft of tibia 02/12/2020   Abnormal feces 12/11/2019   Gait abnormality 12/04/2019   Impaired  left ventricular function 09/05/2019   Pain in joint of left shoulder 02/27/2019   Hypokalemia    Effusion of left knee joint    Hyponatremia    Left patella fracture 10/26/2018   Fracture of patella 10/26/2018   Pain in left knee 09/29/2018   Acquired hypothyroidism 08/25/2017   H/O excision of tumor of brain meninges 08/25/2017   Hammer toe 08/25/2017   History of breast cancer 08/25/2017   Nontoxic thyroid nodule 08/25/2017   Osteopetrosis 08/25/2017   Dysuria 01/19/2017   Vitamin D deficiency 11/18/2016   Tremor, essential 08/18/2016   Seizure disorder ([place]) 10/16/2014   Brain tumor (benign) ([place]) 10/18/2013   Subdural hemorrhage ([place]) 10/09/2013   Anxiety 10/09/2013   Compression fracture of T12 vertebra ([employer]) 10/09/2013   Nontoxic multinodular goiter 09/05/2013   Syncope 08/27/2013   Carotid artery disease ([place]) 08/27/2013   Abnormal thyroid ultrasound 08/27/2013   Cancer of central portion of female breast ([place]) 12/30/2012   Osteoporosis 03/06/2010   ASTHMA 03/05/2009   Asthma 03/05/2009   IRRITABLE BOWEL SYNDROME  diarrhea type 03/21/2008   ANEMIA-NOS 12/16/2006   GERD 12/16/2006   Insulin-requiring or dependent type II diabetes mellitus ([place]) 10/29/2006   Mixed hyperlipidemia 10/29/2006   Essential hypertension 10/29/2006   Allergic rhinitis, cause unspecified 10/29/2006   OSTEOARTHRITIS 10/29/2006    Orientation RESPIRATION BLADDER Height & Weight     Self, Situation, Place, Time  Normal External catheter Weight: 65.2 kg Height:  5' 7.5" (171.5 cm)  BEHAVIORAL SYMPTOMS/MOOD NEUROLOGICAL BOWEL NUTRITION STATUS      Incontinent Diet (see d/c summary)  AMBULATORY STATUS  COMMUNICATION OF NEEDS Skin   Extensive Assist Verbally Normal                       Personal Care Assistance Level of Assistance  Bathing, Feeding, Dressing Bathing Assistance: Maximum assistance Feeding assistance: Independent Dressing Assistance: Limited assistance      Functional Limitations Info  Sight, Hearing, Speech Sight Info: Adequate Hearing Info: Impaired Speech Info: Adequate    SPECIAL CARE FACTORS FREQUENCY                       Contractures Contractures Info: Not present    Additional Factors Info  Code Status, Allergies Code Status Info: full Allergies Info: Bystolic (Nebivolol Hcl)   Carbamazepine   Cholestyramine   Morphine   Niaspan (Niacin Er)   Norvasc (Amlodipine Besylate)   Optivar (Azelastine Hcl)   Repaglinide   Sular (Nisoldipine Er)   Telmisartan   Amoxicillin-pot Clavulanate   Cefdinir   Ciprofloxacin Hcl   Clonidine   Clonidine Hydrochloride   Codeine   Ezetimibe   Hydroxychloroquine   Naproxen   Sulfa Antibiotics   Ziac (Bisoprolol-hydrochlorothiazide)   Amlodipine Besy-benazepril Hcl   Azelastine   Elavil (Amitriptyline)   Empagliflozin   Hydralazine   Iodine I 131 Tositumomab   Kenalog (Triamcinolone)   Keppra (Levetiracetam)   Lamotrigine   Pregabalin   Pseudoephedrine   Pseudoephedrine-guaifenesin Er   Risedronate   Ru-hist D (Brompheniramine-phenylephrine)   Sumatriptan   Topiramate   Ace Inhibitors   Actonel (Risedronate Sodium)   Aspirin   Atacand (Candesartan)   Bextra (Valdecoxib)   Bisoprolol-hydrochlorothiazide   Cefadroxil   Celecoxib   Hydrocodone   Hydrocodone-acetaminophen   Iodinated Diagnostic Agents   Meloxicam   Methylprednisolone Sodium Succinate   Nabumetone   Penicillins   Pseudoephedrine-guaifenesin   Risedronate Sodium   Rofecoxib   Ru-tuss (Chlorphen-pse-atrop-hyos-scop)   Sulfonamide Derivatives   Sulphur (Elemental Sulfur)   Telithromycin   Terfenadine   Trandolapril-verapamil Hcl Er   Trandolapril-verapamil Hcl Er   Valium (Diazepam)           Current Medications (04/07/2021):  This is the current hospital active medication list Current Facility-Administered Medications  Medication Dose Route Frequency Provider Last Rate Last Admin   acetaminophen (TYLENOL) tablet 650 mg  650 mg Oral Q6H PRN  Irene Pap N, DO   650 mg at 04/07/21 0518   ALPRAZolam Duanne Moron) tablet 0.25 mg  0.25 mg Oral BID PRN Irene Pap N, DO   0.25 mg at 04/06/21 2209   ampicillin-sulbactam (UNASYN) 1.5 g in sodium chloride 0.9 % 100 mL IVPB  1.5 g Intravenous Q6H Irene Pap N, DO   Stopped at 04/07/21 9024   benzonatate (TESSALON) capsule 200 mg  200 mg Oral BID PRN Kayleen Memos, DO       cholecalciferol (VITAMIN D3) tablet 5,000 Units  5,000 Units Oral Daily Kayleen Memos, DO   5,000 Units at 04/07/21 0847   cholestyramine light (PREVALITE) packet 4 g  4 g Oral BID Irene Pap N, DO   4 g at 04/07/21 1004   cyanocobalamin ((VITAMIN B-12)) injection 1,000 mcg  1,000 mcg Intramuscular Q21 days Kayleen Memos, DO   1,000 mcg at 04/06/21 1531   diphenhydrAMINE (BENADRYL) injection 12.5 mg  12.5 mg Intravenous Q6H PRN Irene Pap N, DO       enoxaparin (LOVENOX) injection 40 mg  40 mg Subcutaneous Q24H Kayleen Memos, DO  40 mg at 04/06/21 2210   insulin aspart (novoLOG) injection 0-5 Units  0-5 Units Subcutaneous QHS Kayleen Memos, DO   2 Units at 04/06/21 2209   insulin aspart (novoLOG) injection 0-9 Units  0-9 Units Subcutaneous TID WC Kayleen Memos, DO   2 Units at 04/07/21 7017   levothyroxine (SYNTHROID) tablet 75 mcg  75 mcg Oral QAC breakfast Kayleen Memos, DO   75 mcg at 04/07/21 0518   loperamide (IMODIUM) capsule 2 mg  2 mg Oral PRN Irene Pap N, DO   2 mg at 04/05/21 2209   melatonin tablet 3 mg  3 mg Oral QHS PRN Kayleen Memos, DO   3 mg at 04/06/21 2208   phenytoin (DILANTIN) ER capsule 200 mg  200 mg Oral QHS Irene Pap N, DO   200 mg at 04/06/21 2209   polyethylene glycol (MIRALAX / GLYCOLAX) packet 17 g  17 g Oral Daily PRN Irene Pap N, DO       polyvinyl alcohol (LIQUIFILM TEARS) 1.4 % ophthalmic solution 2 drop  2 drop Both Eyes TID Irene Pap N, DO   2 drop at 04/07/21 7939   primidone (MYSOLINE) tablet 250 mg  250 mg Oral Daily Irene Pap N, DO   250 mg at 04/07/21 0300    prochlorperazine (COMPAZINE) injection 10 mg  10 mg Intravenous Q6H PRN Irene Pap N, DO   10 mg at 04/05/21 9233   saccharomyces boulardii (FLORASTOR) capsule 250 mg  250 mg Oral BID Kayleen Memos, DO   250 mg at 04/07/21 0076   sodium bicarbonate tablet 1,300 mg  1,300 mg Oral QID Irene Pap N, DO   1,300 mg at 04/07/21 0847   traMADol (ULTRAM) tablet 50 mg  50 mg Oral Q12H PRN Kayleen Memos, DO   50 mg at 04/06/21 2208   trandolapril (MAVIK) tablet 2 mg  2 mg Oral Daily Irene Pap N, DO   2 mg at 04/07/21 0849   verapamil (CALAN-SR) CR tablet 210 mg  210 mg Oral Daily Kayleen Memos, DO   210 mg at 04/07/21 2263     Discharge Medications: Please see discharge summary for a list of discharge medications.  Relevant Imaging Results:  Relevant Lab Results:   Additional Information SSN: [place], [place]

## 2021-04-07 NOTE — Progress Notes (Signed)
PT Cancellation Note  Patient Details Name: [name] MRN: [id number] DOB: [birth year]   Cancelled Treatment:    Reason Eval/Treat Not Completed: Fatigue/lethargy limiting ability to participate Pt sleeping on arrival.  Pt awakens however declined to participate due to not feeling well and being tired.   Myrtis Hopping [place] 04/07/2021, 3:22 PM Arlyce Dice, DPT Acute Rehabilitation Services Pager: [phone] Office: [phone]

## 2021-04-07 NOTE — Consult Note (Signed)
[facility] Inpatient Consult   04/07/2021  [name] 12-08-1929 [id number]  [facility] CM)   Patient chart reviewed for [facility] CM services with noted extreme high risk score for unplanned readmission. Patient's listed primary provider is not affiliated with [place] of physicians. [facility] CM does not follow.  [name], MSN, RN [facility] [employer] [phone]  Toll free office [phone]

## 2021-04-07 NOTE — TOC Initial Note (Signed)
Transition of Care [facility]) - Initial/Assessment Note    Patient Details  Name: [name] MRN: [id number] Date of Birth: December 18, 1929  Transition of Care [facility] CM/SW Contact:    Trish Mage, LCSW Phone Number: 04/07/2021, 3:36 PM  Clinical Narrative:   Patient seen in follow up to PT recommendation of SNF.  Met with [name] and sister who was at bedside.  She knows she needs to go to SNF, reluctantly agrees to go.  Prefers return to [facility] if possible.  Bed search initiated.  [facility] made bed offer.  Lexine Baton will confirm tomorrow if they have bed for same day. TOC will continue to follow during the course of hospitalization.                 Expected Discharge Plan: Skilled Nursing Facility Barriers to Discharge: Other (must enter comment) (SNF pending confirmation of bed availability for tomorrow)   Patient Goals and CMS Choice     Choice offered to / list presented to : Patient  Expected Discharge Plan and Services Expected Discharge Plan: [place]   Discharge Planning Services: CM Consult Post Acute Care Choice: [place] Living arrangements for the past 2 months: Single Family Home                                      Prior Living Arrangements/Services Living arrangements for the past 2 months: Single Family Home Lives with:: Self Patient language and need for interpreter reviewed:: Yes        Need for Family Participation in Patient Care: Yes (Comment) Care giver support system in place?: Yes (comment)   Criminal Activity/Legal Involvement Pertinent to Current Situation/Hospitalization: No - Comment as needed  Activities of Daily Living Home Assistive Devices/Equipment: Walker (specify type), CBG Meter, Eyeglasses, Bedside commode/3-in-1 ADL Screening (condition at time of admission) Patient's cognitive ability adequate to safely complete daily activities?: No Is the patient deaf or have difficulty hearing?: No Does the patient  have difficulty seeing, even when wearing glasses/contacts?: No Does the patient have difficulty concentrating, remembering, or making decisions?: Yes Patient able to express need for assistance with ADLs?: Yes Does the patient have difficulty dressing or bathing?: Yes Independently performs ADLs?: No Communication: Independent Dressing (OT): Independent Grooming: Independent Feeding: Independent Bathing: Needs assistance Is this a change from baseline?: Pre-admission baseline Toileting: Needs assistance Is this a change from baseline?: Pre-admission baseline In/Out Bed: Needs assistance Is this a change from baseline?: Pre-admission baseline Walks in Home: Dependent Is this a change from baseline?: Change from baseline, expected to last >3 days Does the patient have difficulty walking or climbing stairs?: Yes Weakness of Legs: Both Weakness of Arms/Hands: Both  Permission Sought/Granted Permission sought to share information with : Family Supports Permission granted to share information with : Yes, Verbal Permission Granted  Share Information with NAME: [name] Sister [phone]           Emotional Assessment Appearance:: Appears stated age Attitude/Demeanor/Rapport: Engaged Affect (typically observed): Appropriate Orientation: : Oriented to Self, Oriented to Place, Oriented to Situation Alcohol / Substance Use: Not Applicable Psych Involvement: No (comment)  Admission diagnosis:  Dehydration [E86.0] Hypokalemia [E87.6] Hypomagnesemia [E83.42] Hyponatremia [E87.1] Weakness [R53.1] CAP (community acquired pneumonia) [J18.9] Failure to thrive in adult [R62.7] Pneumonia of both lungs due to infectious organism, unspecified part of lung [J18.9] Patient Active Problem List   Diagnosis Date Noted  CAP (community acquired pneumonia) 04/03/2021   Compression fracture of body of thoracic vertebra ([place]) 08/21/2020   Dilantin toxicity 08/17/2020   Postoperative anemia due  to acute blood loss 06/06/2020   Leukocytosis    Closed right hip fracture ([place]) 06/04/2020   Constipation 05/27/2020   Dyslipidemia 05/27/2020   Enterocolitis due to Clostridium difficile, not specified as recurrent 05/27/2020   Pleural effusion on right 04/01/2020   Acute respiratory failure with hypoxia ([place]) 04/01/2020   Pneumonia of right lung due to infectious organism 04/01/2020   Uncontrolled type 2 diabetes mellitus with hyperglycemia, with long-term current use of insulin ([place]) 04/01/2020   Abnormal CT of the chest 04/01/2020   Subacute pulmonary embolism ([place]) 40/37/5436   Acute metabolic encephalopathy 06/77/0340   Adenocarcinoma of colon ([place]) 04/01/2020   Nausea & vomiting 03/12/2020   Cancer of ascending colon pT3pN0 (0/12 LN) s/p lap right colectomy 03/06/2020 03/09/2020   History of multiple allergies 03/09/2020   S/P right hemicolectomy 03/06/2020   Closed fracture of shaft of tibia 02/12/2020   Abnormal feces 12/11/2019   Gait abnormality 12/04/2019   Impaired left ventricular function 09/05/2019   Pain in joint of left shoulder 02/27/2019   Hypokalemia    Effusion of left knee joint    Hyponatremia    Left patella fracture 10/26/2018   Fracture of patella 10/26/2018   Pain in left knee 09/29/2018   Acquired hypothyroidism 08/25/2017   H/O excision of tumor of brain meninges 08/25/2017   Hammer toe 08/25/2017   History of breast cancer 08/25/2017   Nontoxic thyroid nodule 08/25/2017   Osteopetrosis 08/25/2017   Dysuria 01/19/2017   Vitamin D deficiency 11/18/2016   Tremor, essential 08/18/2016   Seizure disorder ([place]) 10/16/2014   Brain tumor (benign) ([place]) 10/18/2013   Subdural hemorrhage ([place]) 10/09/2013   Anxiety 10/09/2013   Compression fracture of T12 vertebra ([place]) 10/09/2013   Nontoxic multinodular goiter 09/05/2013   Syncope 08/27/2013   Carotid artery disease ([place]) 08/27/2013   Abnormal thyroid ultrasound 08/27/2013   Cancer of central portion  of female breast ([place]) 12/30/2012   Osteoporosis 03/06/2010   ASTHMA 03/05/2009   Asthma 03/05/2009   IRRITABLE BOWEL SYNDROME  diarrhea type 03/21/2008   ANEMIA-NOS 12/16/2006   GERD 12/16/2006   Insulin-requiring or dependent type II diabetes mellitus ([place]) 10/29/2006   Mixed hyperlipidemia 10/29/2006   Essential hypertension 10/29/2006   Allergic rhinitis, cause unspecified 10/29/2006   OSTEOARTHRITIS 10/29/2006   PCP:  Bartholome Bill, MD Pharmacy:   [facility] [place], [place] [address] [place] [address] Phone: [phone] Fax: [phone]     Social Determinants of Health (SDOH) Interventions    Readmission Risk Interventions Readmission Risk Prevention Plan 06/06/2020 03/15/2020 03/13/2020  Transportation Screening Complete Complete -  PCP or Specialist Appt within 3-5 Days - Complete Complete  HRI or [place] - - Complete  Social Work Consult for Recovery Care Planning/Counseling - Complete Complete  Palliative Care Screening - Complete Complete  Medication Review Press [employer]) - Complete Complete  PCP or Specialist appointment within 3-5 days of discharge Complete - -  SW Recovery Care/Counseling Consult Complete - -  Palliative Care Screening Not Applicable - -  Some recent data might be hidden

## 2021-04-07 NOTE — Progress Notes (Signed)
PROGRESS NOTE  [name] MLY:[id number] DOB: July 31, 1929 DOA: 04/03/2021 PCP: Bartholome Bill, MD  HPI/Recap of past 24 hours: [name] is a 87 y.o. female with medical history significant for seizure disorder, gait abnormality, essential tremor, who presented to [facility] ED from home due to worsening productive cough and generalized weakness of 1 week duration.    Upon presentation to the ED work-up revealed multifocal community-acquired pneumonia and electrolyte disturbances.  She completed 5 days of IV antibiotics.  Her electrolytes were replaced.  She was seen by PT OT with recommendation for SNF.  She initially declined.  However after having conversation with her family she agreed to SNF for rehab.  04/07/2021: Patient was seen and examined at her bedside.  She has no new complaints.  There were no acute events overnight.  She is receptive to going to rehab prior to returning home.   Assessment/Plan: Principal Problem:   CAP (community acquired pneumonia)  Multifocal community-acquired pneumonia, POA Productive cough x1 week, subjective fevers and chills multifocal pneumonia seen on chest x-ray. Patient has multiple drug allergies more than 60. She received p.o. doxycycline in the ED x1 dose. Pharmacy consulted to assist with antibiotic choice for which the patient is not allergic to. Received Unasyn and Benadryl, tolerated well. Completed 5 days of IV Unasyn. Initially received IV fluid hydration. Bronchodilators Pulmonary toilet Mobilize as tolerated.  Resolving Non anion gap metabolic acidosis Serum bicarb 17>> 21. Anion gap 4 Continue sodium bicarb p.o. Repeat BMP in the morning  Resolved refractory hypophosphatemia Replaced intravenously x2  Refractory hypokalemia, post repletion. Serum potassium 3.1> 4.1 Repleted orally  Resolved hypomagnesemia, post repletion Magnesium 2.0 from 1.6> 1.9   Type 2 diabetes with hyperglycemia Hemoglobin A1c 6.6 on  04/03/2021. Continue insulin sliding scale   Improving hypovolemic hyponatremia Serum sodium 128> 129> 132. Continue to encourage increase of oral solute intake  Resolved electrolytes disturbances: Hypokalemia, hypomagnesemia   Seizure disorder Continue home regimen Continue seizure precautions   Essential tremors Continue home regimen Fall precautions   Gait abnormality Uses a walker at baseline. PT OT assessed and recommended SNF TOC consulted to assist with SNF placement.  Continue fall precautions Patient has declined SNF   Moderate protein calorie malnutrition Serum albumin 3.4 BMI 22 Continue to encourage oral protein calorie intake Continue oral supplement twice daily  Likely antibiotics induced loose stools Continue Florastor 04/05/2021 Imodium as needed     DVT prophylaxis: Subcu enoxaparin daily   Code Status: Full code   Family Communication: None at bedside.   Disposition Plan: Admit to telemetry unit at [facility] called: None.     Objective: Vitals:   04/06/21 2118 04/07/21 0317 04/07/21 0848 04/07/21 1410  BP: (!) 157/75 128/83 (!) 156/99 136/64  Pulse: 84 83 88 76  Resp:   19 16  Temp: 98.7 F (37.1 C) 98.4 F (36.9 C) 98.4 F (36.9 C) 97.7 F (36.5 C)  TempSrc: Oral Oral Oral Oral  SpO2: 96% 96% 97% 98%  Weight:      Height:        Intake/Output Summary (Last 24 hours) at 04/07/2021 1543 Last data filed at 04/07/2021 1411 Gross per 24 hour  Intake 1120 ml  Output 700 ml  Net 420 ml    Filed Weights   04/03/21 1359  Weight: 65.2 kg    Exam:  General: 87 y.o. year-old female frail-appearing no acute distress.  She is alert and oriented x3. Cardiovascular:  Regular rate and rhythm no rubs or gallops.  No JVD. Respiratory: Clear to auscultation with no wheezes or rales.   Abdomen: Soft nontender normal bowel sounds present.   Musculoskeletal: No lower extremity edema bilaterally.   Skin: No ulcerative  lesions seen. Psychiatry: Mood is appropriate for condition.  Data Reviewed: CBC: Recent Labs  Lab 04/03/21 1106 04/04/21 0500 04/06/21 0545  WBC 13.1* 9.1 7.5  NEUTROABS 9.8*  --   --   HGB 11.3* 9.8* 9.9*  HCT 33.9* 28.9* 28.2*  MCV 93.4 92.0 89.5  PLT 313 325 408*   Basic Metabolic Panel: Recent Labs  Lab 04/03/21 1106 04/03/21 1156 04/04/21 0500 04/05/21 0605 04/06/21 0545 04/07/21 0550  NA 128*  --  129* 129* 130* 132*  K 3.1*  --  4.7 3.6 3.1* 4.1  CL 102  --  108 106 103 106  CO2 18*  --  17* 18* 21* 21*  GLUCOSE 218*  --  208* 178* 187* 166*  BUN 9  --  8 6* <5* <5*  CREATININE 0.86  --  0.63 0.62 0.55 0.56  CALCIUM 8.0*  --  7.7* 7.6* 7.5* 7.6*  MG  --  1.6* 2.0 1.9  --   --   PHOS  --   --  1.2* 1.6* 2.9  --    GFR: Estimated Creatinine Clearance: 47.3 mL/min (by C-G formula based on SCr of 0.56 mg/dL). Liver Function Tests: Recent Labs  Lab 04/03/21 1106 04/04/21 0500  AST 16 14*  ALT 15 15  ALKPHOS 112 112  BILITOT 0.5 0.7  PROT 6.8 5.6*  ALBUMIN 3.4* 2.9*   No results for input(s): LIPASE, AMYLASE in the last 168 hours. No results for input(s): AMMONIA in the last 168 hours. Coagulation Profile: No results for input(s): INR, PROTIME in the last 168 hours. Cardiac Enzymes: No results for input(s): CKTOTAL, CKMB, CKMBINDEX, TROPONINI in the last 168 hours. BNP (last 3 results) No results for input(s): PROBNP in the last 8760 hours. HbA1C: No results for input(s): HGBA1C in the last 72 hours.  CBG: Recent Labs  Lab 04/06/21 2105 04/06/21 2121 04/06/21 2302 04/07/21 0800 04/07/21 1150  GLUCAP 90 218* 98 164* 149*   Lipid Profile: No results for input(s): CHOL, HDL, LDLCALC, TRIG, CHOLHDL, LDLDIRECT in the last 72 hours. Thyroid Function Tests: No results for input(s): TSH, T4TOTAL, FREET4, T3FREE, THYROIDAB in the last 72 hours. Anemia Panel: No results for input(s): VITAMINB12, FOLATE, FERRITIN, TIBC, IRON, RETICCTPCT in the last  72 hours. Urine analysis:    Component Value Date/Time   COLORURINE YELLOW 04/03/2021 [place] 04/03/2021 1511   LABSPEC 1.013 04/03/2021 1511   PHURINE 5.0 04/03/2021 1511   GLUCOSEU NEGATIVE 04/03/2021 1511   GLUCOSEU NEGATIVE 12/28/2013 1036   HGBUR NEGATIVE 04/03/2021 1511   HGBUR trace-intact 06/29/2007 1304   BILIRUBINUR NEGATIVE 04/03/2021 1511   BILIRUBINUR negative 01/19/2017 1049   KETONESUR 20 (A) 04/03/2021 1511   PROTEINUR 30 (A) 04/03/2021 1511   UROBILINOGEN 0.2 01/19/2017 1049   UROBILINOGEN 1.0 03/25/2015 1755   NITRITE NEGATIVE 04/03/2021 1511   LEUKOCYTESUR NEGATIVE 04/03/2021 1511   Sepsis Labs: @LABRCNTIP (procalcitonin:4,lacticidven:4)  ) Recent Results (from the past 240 hour(s))  Resp Panel by RT-PCR (Flu A&B, Covid) Nasopharyngeal Swab     Status: None   Collection Time: 03/30/21 10:40 PM   Specimen: Nasopharyngeal Swab; Nasopharyngeal(NP) swabs in vial transport medium  Result Value Ref Range Status   SARS Coronavirus 2 by RT  PCR NEGATIVE NEGATIVE Final    Comment: (NOTE) SARS-CoV-2 target nucleic acids are NOT DETECTED.  The SARS-CoV-2 RNA is generally detectable in upper respiratory specimens during the acute phase of infection. The lowest concentration of SARS-CoV-2 viral copies this assay can detect is 138 copies/mL. A negative result does not preclude SARS-Cov-2 infection and should not be used as the sole basis for treatment or other patient management decisions. A negative result Resch occur with  improper specimen collection/handling, submission of specimen other than nasopharyngeal swab, presence of viral mutation(s) within the areas targeted by this assay, and inadequate number of viral copies(<138 copies/mL). A negative result must be combined with clinical observations, patient history, and epidemiological information. The expected result is Negative.  Fact Sheet for Patients:   EntrepreneurPulse.com.au  Fact Sheet for Healthcare Providers:  IncredibleEmployment.be  This test is no t yet approved or cleared by the [place] FDA and  has been authorized for detection and/or diagnosis of SARS-CoV-2 by FDA under an Emergency Use Authorization (EUA). This EUA will remain  in effect (meaning this test can be used) for the duration of the COVID-19 declaration under Section 564(b)(1) of the Act, 21 U.S.C.section 360bbb-3(b)(1), unless the authorization is terminated  or revoked sooner.       Influenza A by PCR NEGATIVE NEGATIVE Final   Influenza B by PCR NEGATIVE NEGATIVE Final    Comment: (NOTE) The Xpert Xpress SARS-CoV-2/FLU/RSV plus assay is intended as an aid in the diagnosis of influenza from Nasopharyngeal swab specimens and should not be used as a sole basis for treatment. Nasal washings and aspirates are unacceptable for Xpert Xpress SARS-CoV-2/FLU/RSV testing.  Fact Sheet for Patients: EntrepreneurPulse.com.au  Fact Sheet for Healthcare Providers: IncredibleEmployment.be  This test is not yet approved or cleared by the [place] FDA and has been authorized for detection and/or diagnosis of SARS-CoV-2 by FDA under an Emergency Use Authorization (EUA). This EUA will remain in effect (meaning this test can be used) for the duration of the COVID-19 declaration under Section 564(b)(1) of the Act, 21 U.S.C. section 360bbb-3(b)(1), unless the authorization is terminated or revoked.  Performed at [facility], [place] [address] [place], [place] [address]   Urine Culture     Status: Abnormal   Collection Time: 03/31/21  2:38 AM   Specimen: In/Out Cath Urine  Result Value Ref Range Status   Specimen Description   Final    IN/OUT CATH URINE Performed at [place] [address] [place], [place] [address]    Special Requests   Final     NONE Performed at [facility], [place] [address] [place], [place] [address]    Culture >=100,000 COLONIES/mL KLEBSIELLA PNEUMONIAE (A)  Final   Report Status 04/01/2021 FINAL  Final   Organism ID, Bacteria KLEBSIELLA PNEUMONIAE (A)  Final      Susceptibility   Klebsiella pneumoniae - MIC*    AMPICILLIN >=32 RESISTANT Resistant     CEFAZOLIN <=4 SENSITIVE Sensitive     CEFEPIME <=0.12 SENSITIVE Sensitive     CEFTRIAXONE <=0.25 SENSITIVE Sensitive     CIPROFLOXACIN <=0.25 SENSITIVE Sensitive     GENTAMICIN <=1 SENSITIVE Sensitive     IMIPENEM <=0.25 SENSITIVE Sensitive     NITROFURANTOIN 64 INTERMEDIATE Intermediate     TRIMETH/SULFA <=20 SENSITIVE Sensitive     AMPICILLIN/SULBACTAM <=2 SENSITIVE Sensitive     PIP/TAZO <=4 SENSITIVE Sensitive     * >=100,000 COLONIES/mL KLEBSIELLA PNEUMONIAE  Resp Panel by  RT-PCR (Flu A&B, Covid)     Status: None   Collection Time: 04/03/21 10:56 AM  Result Value Ref Range Status   SARS Coronavirus 2 by RT PCR NEGATIVE NEGATIVE Final    Comment: (NOTE) SARS-CoV-2 target nucleic acids are NOT DETECTED.  The SARS-CoV-2 RNA is generally detectable in upper respiratory specimens during the acute phase of infection. The lowest concentration of SARS-CoV-2 viral copies this assay can detect is 138 copies/mL. A negative result does not preclude SARS-Cov-2 infection and should not be used as the sole basis for treatment or other patient management decisions. A negative result [place] occur with  improper specimen collection/handling, submission of specimen other than nasopharyngeal swab, presence of viral mutation(s) within the areas targeted by this assay, and inadequate number of viral copies(<138 copies/mL). A negative result must be combined with clinical observations, patient history, and epidemiological information. The expected result is Negative.  Fact Sheet for Patients:  EntrepreneurPulse.com.au  Fact Sheet  for Healthcare Providers:  IncredibleEmployment.be  This test is no t yet approved or cleared by the [place] FDA and  has been authorized for detection and/or diagnosis of SARS-CoV-2 by FDA under an Emergency Use Authorization (EUA). This EUA will remain  in effect (meaning this test can be used) for the duration of the COVID-19 declaration under Section 564(b)(1) of the Act, 21 U.S.C.section 360bbb-3(b)(1), unless the authorization is terminated  or revoked sooner.       Influenza A by PCR NEGATIVE NEGATIVE Final   Influenza B by PCR NEGATIVE NEGATIVE Final    Comment: (NOTE) The Xpert Xpress SARS-CoV-2/FLU/RSV plus assay is intended as an aid in the diagnosis of influenza from Nasopharyngeal swab specimens and should not be used as a sole basis for treatment. Nasal washings and aspirates are unacceptable for Xpert Xpress SARS-CoV-2/FLU/RSV testing.  Fact Sheet for Patients: EntrepreneurPulse.com.au  Fact Sheet for Healthcare Providers: IncredibleEmployment.be  This test is not yet approved or cleared by the [place] FDA and has been authorized for detection and/or diagnosis of SARS-CoV-2 by FDA under an Emergency Use Authorization (EUA). This EUA will remain in effect (meaning this test can be used) for the duration of the COVID-19 declaration under Section 564(b)(1) of the Act, 21 U.S.C. section 360bbb-3(b)(1), unless the authorization is terminated or revoked.  Performed at [facility], [place] [address] [place], [place] [address]   Culture, blood (routine x 2)     Status: None (Preliminary result)   Collection Time: 04/03/21 11:06 AM   Specimen: Left Antecubital; Blood  Result Value Ref Range Status   Specimen Description   Final    LEFT ANTECUBITAL Performed at [place] [address], [place], [place] [address]    Special Requests   Final    BOTTLES DRAWN  AEROBIC AND ANAEROBIC Blood Culture adequate volume Performed at [place] [address] [place], [place] [address]    Culture   Final    NO GROWTH 4 DAYS Performed at [facility], [place] [address] [place], [place] [address]    Report Status PENDING  Incomplete  Culture, blood (routine x 2)     Status: None (Preliminary result)   Collection Time: 04/03/21  1:33 PM   Specimen: BLOOD  Result Value Ref Range Status   Specimen Description   Final    BLOOD LEFT ANTECUBITAL Performed at [place] [address] [place], [place] [address]    Special Requests  Final    BOTTLES DRAWN AEROBIC AND ANAEROBIC Blood Culture results Muff not be optimal due to an excessive volume of blood received in culture bottles Performed at [place] [address] [place], [place] [address]    Culture   Final    NO GROWTH 4 DAYS Performed at [facility], [place] [address] [place], [place] [address]    Report Status PENDING  Incomplete  Respiratory (~20 pathogens) panel by PCR     Status: None   Collection Time: 04/05/21  9:59 AM  Result Value Ref Range Status   Adenovirus NOT DETECTED NOT DETECTED Final   Coronavirus 229E NOT DETECTED NOT DETECTED Final    Comment: (NOTE) The Coronavirus on the Respiratory Panel, DOES NOT test for the novel  Coronavirus (2019 nCoV)    Coronavirus HKU1 NOT DETECTED NOT DETECTED Final   Coronavirus NL63 NOT DETECTED NOT DETECTED Final   Coronavirus OC43 NOT DETECTED NOT DETECTED Final   Metapneumovirus NOT DETECTED NOT DETECTED Final   Rhinovirus / Enterovirus NOT DETECTED NOT DETECTED Final   Influenza A NOT DETECTED NOT DETECTED Final   Influenza B NOT DETECTED NOT DETECTED Final   Parainfluenza Virus 1 NOT DETECTED NOT DETECTED Final   Parainfluenza Virus 2 NOT DETECTED NOT DETECTED Final   Parainfluenza Virus 3 NOT DETECTED NOT DETECTED Final   Parainfluenza Virus 4 NOT DETECTED  NOT DETECTED Final   Respiratory Syncytial Virus NOT DETECTED NOT DETECTED Final   Bordetella pertussis NOT DETECTED NOT DETECTED Final   Bordetella Parapertussis NOT DETECTED NOT DETECTED Final   Chlamydophila pneumoniae NOT DETECTED NOT DETECTED Final   Mycoplasma pneumoniae NOT DETECTED NOT DETECTED Final    Comment: Performed at [facility], [place]. [address] [place], [place] [address]      Studies: No results found.  Scheduled Meds:  cholecalciferol  5,000 Units Oral Daily   cholestyramine light  4 g Oral BID   cyanocobalamin  1,000 mcg Intramuscular Q21 days   enoxaparin (LOVENOX) injection  40 mg Subcutaneous Q24H   insulin aspart  0-5 Units Subcutaneous QHS   insulin aspart  0-9 Units Subcutaneous TID WC   levothyroxine  75 mcg Oral QAC breakfast   phenytoin  200 mg Oral QHS   polyvinyl alcohol  2 drop Both Eyes TID   primidone  250 mg Oral Daily   saccharomyces boulardii  250 mg Oral BID   trandolapril  2 mg Oral Daily   verapamil  210 mg Oral Daily    Continuous Infusions:  ampicillin-sulbactam (UNASYN) IV Stopped (04/07/21 0933)     LOS: 4 days     Kayleen Memos, MD [facility] Pager [phone]  If 7PM-7AM, please contact night-coverage [email or web address] Password [facility] 04/07/2021, 3:43 PM

## 2021-04-08 DIAGNOSIS — E039 Hypothyroidism, unspecified: Secondary | ICD-10-CM | POA: Diagnosis not present

## 2021-04-08 DIAGNOSIS — G40909 Epilepsy, unspecified, not intractable, without status epilepticus: Secondary | ICD-10-CM | POA: Diagnosis not present

## 2021-04-08 DIAGNOSIS — G40919 Epilepsy, unspecified, intractable, without status epilepticus: Secondary | ICD-10-CM | POA: Diagnosis not present

## 2021-04-08 DIAGNOSIS — R55 Syncope and collapse: Secondary | ICD-10-CM | POA: Diagnosis not present

## 2021-04-08 DIAGNOSIS — R2681 Unsteadiness on feet: Secondary | ICD-10-CM | POA: Diagnosis not present

## 2021-04-08 DIAGNOSIS — D62 Acute posthemorrhagic anemia: Secondary | ICD-10-CM | POA: Diagnosis not present

## 2021-04-08 DIAGNOSIS — E1159 Type 2 diabetes mellitus with other circulatory complications: Secondary | ICD-10-CM | POA: Diagnosis not present

## 2021-04-08 DIAGNOSIS — E041 Nontoxic single thyroid nodule: Secondary | ICD-10-CM | POA: Diagnosis not present

## 2021-04-08 DIAGNOSIS — Z9181 History of falling: Secondary | ICD-10-CM | POA: Diagnosis not present

## 2021-04-08 DIAGNOSIS — Z794 Long term (current) use of insulin: Secondary | ICD-10-CM | POA: Diagnosis not present

## 2021-04-08 DIAGNOSIS — I152 Hypertension secondary to endocrine disorders: Secondary | ICD-10-CM | POA: Diagnosis not present

## 2021-04-08 DIAGNOSIS — M6281 Muscle weakness (generalized): Secondary | ICD-10-CM | POA: Diagnosis not present

## 2021-04-08 DIAGNOSIS — T420X5D Adverse effect of hydantoin derivatives, subsequent encounter: Secondary | ICD-10-CM | POA: Diagnosis not present

## 2021-04-08 DIAGNOSIS — Z743 Need for continuous supervision: Secondary | ICD-10-CM | POA: Diagnosis not present

## 2021-04-08 DIAGNOSIS — C182 Malignant neoplasm of ascending colon: Secondary | ICD-10-CM | POA: Diagnosis not present

## 2021-04-08 DIAGNOSIS — E1165 Type 2 diabetes mellitus with hyperglycemia: Secondary | ICD-10-CM | POA: Diagnosis not present

## 2021-04-08 DIAGNOSIS — D649 Anemia, unspecified: Secondary | ICD-10-CM | POA: Diagnosis not present

## 2021-04-08 DIAGNOSIS — M810 Age-related osteoporosis without current pathological fracture: Secondary | ICD-10-CM | POA: Diagnosis not present

## 2021-04-08 DIAGNOSIS — J189 Pneumonia, unspecified organism: Secondary | ICD-10-CM | POA: Diagnosis not present

## 2021-04-08 DIAGNOSIS — E440 Moderate protein-calorie malnutrition: Secondary | ICD-10-CM | POA: Diagnosis not present

## 2021-04-08 DIAGNOSIS — R5381 Other malaise: Secondary | ICD-10-CM | POA: Diagnosis not present

## 2021-04-08 DIAGNOSIS — R404 Transient alteration of awareness: Secondary | ICD-10-CM | POA: Diagnosis not present

## 2021-04-08 DIAGNOSIS — Z20822 Contact with and (suspected) exposure to covid-19: Secondary | ICD-10-CM | POA: Diagnosis not present

## 2021-04-08 DIAGNOSIS — R41841 Cognitive communication deficit: Secondary | ICD-10-CM | POA: Diagnosis not present

## 2021-04-08 DIAGNOSIS — S22009D Unspecified fracture of unspecified thoracic vertebra, subsequent encounter for fracture with routine healing: Secondary | ICD-10-CM | POA: Diagnosis not present

## 2021-04-08 DIAGNOSIS — R2689 Other abnormalities of gait and mobility: Secondary | ICD-10-CM | POA: Diagnosis not present

## 2021-04-08 LAB — GLUCOSE, CAPILLARY
Glucose-Capillary: 187 mg/dL — ABNORMAL HIGH (ref 70–99)
Glucose-Capillary: 168 mg/dL — ABNORMAL HIGH (ref 70–99)

## 2021-04-08 LAB — CULTURE, BLOOD (ROUTINE X 2)
Culture: NO GROWTH
Culture: NO GROWTH
Special Requests: ADEQUATE

## 2021-04-08 NOTE — Progress Notes (Signed)
Pt discharging to facility. AVS printed and placed in patient folder for facility use. PIV removed. VSS. Report called to facility and given to [facility]

## 2021-04-08 NOTE — Discharge Summary (Signed)
Discharge Summary  [name] MPN:[id number] DOB: 11/07/1929  PCP: Bartholome Bill, MD  Admit date: 04/03/2021 Discharge date: 04/08/2021  Time spent: 35 minutes   Recommendations for Outpatient Follow-up:  Follow up with your primary care provider Take your medications as prescribed Continue PT OT with assistance and fall precautions  Discharge Diagnoses:  Active Hospital Problems   Diagnosis Date Noted   CAP (community acquired pneumonia) 04/03/2021    Resolved Hospital Problems  No resolved problems to display.    Discharge Condition: Stable  Diet recommendation: Resume previous diet  Vitals:   04/08/21 0324 04/08/21 0848  BP: (!) 149/84 (!) 152/74  Pulse: 83 (!) 101  Resp: 20   Temp: 98 F (36.7 C)   SpO2: 98%     History of present illness:  [name] is a 87 y.o. female with medical history significant for seizure disorder, gait abnormality, essential tremor, who presented to [facility] ED from home due to worsening productive cough and generalized weakness of 1 week duration.     Upon presentation to the ED work-up revealed multifocal community-acquired pneumonia and electrolyte disturbances.  She completed 5 days of IV antibiotics.  Her electrolytes were replaced.  She was seen by PT OT with recommendation for SNF.  She initially declined.  However after having conversation with her family she agreed to SNF for rehab.   04/08/2021: Seen at her bedside.  There were no acute events overnight.  She has no new complaints.  Hospital Course:  Principal Problem:   CAP (community acquired pneumonia)  Multifocal community-acquired pneumonia, POA Productive cough x1 week, subjective fevers and chills, multifocal pneumonia seen on chest x-ray. Patient has more than 60 drug allergies. She received p.o. doxycycline in the ED x1 dose with no adverse reaction. Pharmacy was consulted to assist with antibiotic choice for which the patient is not allergic to. Received Unasyn  and Benadryl, tolerated well. Completed 5 days of IV Unasyn with no adverse reaction. Received IV fluid hydration, bronchodilators, pulmonary toilet and mobilized as tolerated. Follow-up with your PCP.   Resolving Non anion gap metabolic acidosis Serum bicarb 17>> 21. Anion gap 4 She has received replacement with p.o. sodium bicarb 1300 mg 4 times daily x3 days.   Resolved refractory hypophosphatemia Serum phosphorus post repletion 2.9 from 1.2.   Refractory hypokalemia, post repletion. Serum potassium 4.1 from 3.1.   Resolved hypomagnesemia, post repletion Serum magnesium 1.9 from 1.6.   Type 2 diabetes with hyperglycemia Hemoglobin A1c 6.6 on 04/03/2021. Resume home regimen. Follow-up with your PCP.   Improving hypovolemic hyponatremia Serum sodium 132 from 128 Continue to encourage increase of oral solute intake Follow-up with your PCP.   Resolved electrolytes disturbances: Hypokalemia, hypomagnesemia, hypophosphatemia Follow-up with your PCP.   Seizure disorder Continue home regimen Continue seizure precautions   Essential tremors Continue home regimen Continue fall precautions   Gait abnormality Uses a walker at baseline. PT OT assessed and recommended SNF TOC consulted to assist with SNF placement. Continue PT OT with assistance and fall precautions.   Moderate protein calorie malnutrition Serum albumin 3.4 BMI 22 Continue to encourage increase in oral protein calorie intake   Chronic intermittent loose stools Resume home regimen.      Code Status: Full code        Procedures: None  Consultations: None  Discharge Exam: BP (!) 152/74   Pulse (!) 101   Temp 98 F (36.7 C) (Oral)   Resp 20   Ht 5' 7.5" (  1.715 m)   Wt 65.2 kg   SpO2 98%   BMI 22.19 kg/m  General: 87 y.o. year-old female well developed well nourished in no acute distress.  Alert and oriented x3. Cardiovascular: Regular rate and rhythm with no rubs or gallops.  No  thyromegaly or JVD noted.   Respiratory: Clear to auscultation with no wheezes or rales. Good inspiratory effort. Abdomen: Soft nontender nondistended with normal bowel sounds x4 quadrants. Musculoskeletal: No lower extremity edema. 2/4 pulses in all 4 extremities. Skin: No ulcerative lesions noted or rashes, Psychiatry: Mood is appropriate for condition and setting  Discharge Instructions You were cared for by a hospitalist during your hospital stay. If you have any questions about your discharge medications or the care you received while you were in the hospital after you are discharged, you can call the unit and asked to speak with the hospitalist on call if the hospitalist that took care of you is not available. Once you are discharged, your primary care physician will handle any further medical issues. Please note that NO REFILLS for any discharge medications will be authorized once you are discharged, as it is imperative that you return to your primary care physician (or establish a relationship with a primary care physician if you do not have one) for your aftercare needs so that they can reassess your need for medications and monitor your lab values.   Allergies as of 04/08/2021       Reactions   Bystolic [nebivolol Hcl] Other (See Comments)   "extreme weakness, heaviness in legs & arms, swelling in legs/arms/face, swollen abdomen, pain in bladder, feet pain, soreness in chest"   Carbamazepine Other (See Comments)   Blood poisoning   Cholestyramine Itching, Nausea And Vomiting, Rash, Other (See Comments)   "itching rash on stomach, bloated, nausea, vomiting, sleeplessness, extreme pain in arms"  Patient is taking this in 2022.   Morphine Other (See Comments)   Feels morbid, weak, still in pain   Niaspan [niacin Er] Palpitations, Other (See Comments)   "fast heart beat, high blood pressure"   Norvasc [amlodipine Besylate] Other (See Comments)   "extreme fluid retention/pain)    Optivar [azelastine Hcl] Photosensitivity   Repaglinide Hives   Sular [nisoldipine Er] Other (See Comments)   "severe headaches, swelling eyes, hands, feet, shortness of breath, weak, flushed face, brain boiling, fluid retention, high blood sugar, nervous, heart fast beating"   Telmisartan Other (See Comments)   "headache, difficulty urinating, high blood sugar, fluid retention"   Amoxicillin-pot Clavulanate Rash   Tolerates Unasyn (>10 doses)   Cefdinir Swelling   Vaginal irritation, breathing,    Ciprofloxacin Hcl Hives   Clonidine Other (See Comments)   Dry mouth, fluid retention   Clonidine Hydrochloride Other (See Comments)   Dry mouth, fluid retention   Codeine Nausea And Vomiting   Ezetimibe Other (See Comments)   Made weak   Hydroxychloroquine Other (See Comments)   Low platlets   Naproxen Other (See Comments)   Shrinks bladder   Sulfa Antibiotics Rash   Ziac [bisoprolol-hydrochlorothiazide] Other (See Comments)   "stopped urination"   Amlodipine Besy-benazepril Hcl Cough   Azelastine Other (See Comments)   Unknown reaction - Per MAR   Elavil [amitriptyline] Other (See Comments)   Gave Pt nightmares   Empagliflozin Other (See Comments)   "Caused yeast infection, slowed my urine"   Hydralazine Other (See Comments)   "does not reduce high blood pressure, pain in arm, high pressure, felt like I was  on verge of heart attack, really weak"   Iodine I 131 Tositumomab Other (See Comments)   Unknown reaction - Per MAR   Kenalog [triamcinolone] Diarrhea   Per MAR   Keppra [levetiracetam] Other (See Comments)   Shaking   Lamotrigine Itching, Other (See Comments)   Pregabalin Swelling, Other (See Comments)   Weight gain   Pseudoephedrine Other (See Comments)   Unknown reaction   Pseudoephedrine-guaifenesin Er Other (See Comments)   Unknown reaction - Per MAR   Risedronate    Unknown reaction - Per MAR   Ru-hist D [brompheniramine-phenylephrine] Other (See Comments)    Unknown reaction - Per MAR   Sumatriptan Other (See Comments)   Topiramate Other (See Comments)   Dry eyes   Ace Inhibitors Other (See Comments)   unknown   Actonel [risedronate Sodium] Other (See Comments)   unknown   Aspirin Other (See Comments)   Unknown reaction   Atacand [candesartan] Other (See Comments)   Unknown reaction - MAR   Bextra [valdecoxib] Other (See Comments)   Unknown reaction - Per MAR   Bisoprolol-hydrochlorothiazide Other (See Comments)   Unknown reaction - Per MAR   Cefadroxil Other (See Comments)   Unknown reaction   Celecoxib Rash   Hydrocodone Other (See Comments)   unknown   Hydrocodone-acetaminophen Other (See Comments)   unknown   Iodinated Diagnostic Agents Rash, Other (See Comments)   "All over" rash   Meloxicam Other (See Comments)   unknown   Methylprednisolone Sodium Succinate Other (See Comments)   unknown   Nabumetone Other (See Comments)   Unknown reaction   Penicillins Other (See Comments)   unknown   Pseudoephedrine-guaifenesin Other (See Comments)   unknown   Risedronate Sodium Other (See Comments)   unknown   Rofecoxib Other (See Comments)   Unknown reaction - MAR Other reaction(s): Unknown   Ru-tuss [chlorphen-pse-atrop-hyos-scop] Other (See Comments)   unknown   Sulfonamide Derivatives Other (See Comments)   unknown   Sulphur [elemental Sulfur] Other (See Comments)   unknown   Telithromycin Other (See Comments)   unknown   Terfenadine Other (See Comments)   Unknown reaction - Per MAR   Trandolapril-verapamil Hcl Er Other (See Comments)   Headache, difficulty urinating, high blood sugar, fluid retention Pt is taking Tarka (trandolapril-verapamil) currently, but requests the medication stay in her allergy list   Trandolapril-verapamil Hcl Er Other (See Comments)   Headache, difficulty urinating, high blood sugar, fluid retention Pt is taking Tarka (trandolapril-verapamil) currently, but requests the medication stay in her  allergy list   Valium [diazepam] Other (See Comments)   Makes her mean and hyper        Medication List     STOP taking these medications    chlorpheniramine-HYDROcodone 10-8 MG/5ML Suer Commonly known as: TUSSIONEX   lidocaine 2 % solution Commonly known as: XYLOCAINE   lidocaine 5 % Commonly known as: LIDODERM   nitrofurantoin (macrocrystal-monohydrate) 100 MG capsule Commonly known as: MACROBID   sulfamethoxazole-trimethoprim 800-160 MG tablet Commonly known as: BACTRIM DS       TAKE these medications    acetaminophen 500 MG tablet Commonly known as: TYLENOL Take 2 tablets (1,000 mg total) by mouth 3 (three) times daily. What changed:  when to take this reasons to take this   ALPRAZolam 0.25 MG tablet Commonly known as: Xanax Take 1 tablet (0.25 mg total) by mouth 3 (three) times daily as needed for up to 3 days for anxiety.   benzonatate 200 MG capsule  Commonly known as: TESSALON Take 1 capsule (200 mg total) by mouth 2 (two) times daily as needed for cough.   cholestyramine 4 g packet Commonly known as: QUESTRAN Take 4 g by mouth 2 (two) times daily.   cyanocobalamin 1000 MCG/ML injection Commonly known as: (VITAMIN B-12) Inject 1,000 mcg into the muscle every 21 ( twenty-one) days.   diclofenac Sodium 1 % Gel Commonly known as: VOLTAREN Apply 2 g topically 4 (four) times daily as needed (for knee and back pain).   diphenhydrAMINE 25 MG tablet Commonly known as: BENADRYL Take 50 mg by mouth at bedtime.   feeding supplement (GLUCERNA SHAKE) Liqd Take 237 mLs by mouth daily as needed (for poor appetite).   Glucose Meter Test test strip Generic drug: glucose blood See admin instructions.   PRODIGY VOICE BLOOD GLUCOSE VI   insulin aspart 100 UNIT/ML FlexPen Commonly known as: NOVOLOG Inject 2-12 Units into the skin See admin instructions. Injects 2-10 units of insulin under the skin 3 times a day per sliding scale: CBG 0-150: 2 units; CBG  151-200: 4 units; CBG 201-250: 6 units; CBG 251-300: 8 units; CBG 301-350: 10 units; CBG 351-400: 12 units; CBG >400: 12 units and notify the MD/NP What changed: additional instructions   Insulin Pen Needle 32G X 4 MM Misc Used to inject insulin 3x daily   Lantus SoloStar 100 UNIT/ML Solostar Pen Generic drug: insulin glargine Inject 10 Units into the skin at bedtime. What changed:  how much to take when to take this additional instructions   levothyroxine 75 MCG tablet Commonly known as: SYNTHROID Take 1 tablet (75 mcg total) by mouth daily before breakfast.   phenytoin 200 MG ER capsule Commonly known as: DILANTIN Take 1 capsule (200 mg total) by mouth at bedtime.   primidone 250 MG tablet Commonly known as: MYSOLINE Take 1 tablet (250 mg total) by mouth at bedtime. What changed:  when to take this Another medication with the same name was removed. Continue taking this medication, and follow the directions you see here.   Prodigy Voice Blood Glucose w/Device Kit Use to check blood sugar 1 time per day.   Systane 0.4-0.3 % Soln Generic drug: Polyethyl Glycol-Propyl Glycol Place 2 drops into both eyes 3 (three) times daily.   traMADol 50 MG tablet Commonly known as: ULTRAM Take 1 tablet (50 mg total) by mouth 2 (two) times daily as needed for up to 3 days for severe pain (for pain). What changed: reasons to take this   trandolapril 2 MG tablet Commonly known as: MAVIK Take 2 mg by mouth daily.   Verapamil HCl CR 200 MG Cp24 Take 200 mg by mouth daily.   Vitamin D-3 125 MCG (5000 UT) Tabs Take 5,000 Units by mouth daily.       Allergies  Allergen Reactions   Bystolic [Nebivolol Hcl] Other (See Comments)    "extreme weakness, heaviness in legs & arms, swelling in legs/arms/face, swollen abdomen, pain in bladder, feet pain, soreness in chest"   Carbamazepine Other (See Comments)    Blood poisoning   Cholestyramine Itching, Nausea And Vomiting, Rash and Other  (See Comments)    "itching rash on stomach, bloated, nausea, vomiting, sleeplessness, extreme pain in arms"  Patient is taking this in 2022.   Morphine Other (See Comments)    Feels morbid, weak, still in pain   Niaspan [Niacin Er] Palpitations and Other (See Comments)    "fast heart beat, high blood pressure"   Norvasc [Amlodipine  Besylate] Other (See Comments)    "extreme fluid retention/pain)   Optivar [Azelastine Hcl] Photosensitivity   Repaglinide Hives   Sular [Nisoldipine Er] Other (See Comments)    "severe headaches, swelling eyes, hands, feet, shortness of breath, weak, flushed face, brain boiling, fluid retention, high blood sugar, nervous, heart fast beating"   Telmisartan Other (See Comments)    "headache, difficulty urinating, high blood sugar, fluid retention"   Amoxicillin-Pot Clavulanate Rash    Tolerates Unasyn (>10 doses)   Cefdinir Swelling    Vaginal irritation, breathing,    Ciprofloxacin Hcl Hives   Clonidine Other (See Comments)    Dry mouth, fluid retention   Clonidine Hydrochloride Other (See Comments)    Dry mouth, fluid retention   Codeine Nausea And Vomiting   Ezetimibe Other (See Comments)    Made weak   Hydroxychloroquine Other (See Comments)    Low platlets   Naproxen Other (See Comments)    Shrinks bladder   Sulfa Antibiotics Rash   Ziac [Bisoprolol-Hydrochlorothiazide] Other (See Comments)    "stopped urination"   Amlodipine Besy-Benazepril Hcl Cough   Azelastine Other (See Comments)    Unknown reaction - Per MAR   Elavil [Amitriptyline] Other (See Comments)    Gave Pt nightmares   Empagliflozin Other (See Comments)    "Caused yeast infection, slowed my urine"   Hydralazine Other (See Comments)    "does not reduce high blood pressure, pain in arm, high pressure, felt like I was on verge of heart attack, really weak"   Iodine I 131 Tositumomab Other (See Comments)    Unknown reaction - Per MAR   Kenalog [Triamcinolone] Diarrhea    Per MAR    Keppra [Levetiracetam] Other (See Comments)    Shaking   Lamotrigine Itching and Other (See Comments)   Pregabalin Swelling and Other (See Comments)    Weight gain   Pseudoephedrine Other (See Comments)    Unknown reaction   Pseudoephedrine-Guaifenesin Er Other (See Comments)    Unknown reaction - Per MAR   Risedronate     Unknown reaction - Per MAR   Ru-Hist D [Brompheniramine-Phenylephrine] Other (See Comments)    Unknown reaction - Per MAR   Sumatriptan Other (See Comments)   Topiramate Other (See Comments)    Dry eyes   Ace Inhibitors Other (See Comments)    unknown   Actonel [Risedronate Sodium] Other (See Comments)    unknown   Aspirin Other (See Comments)    Unknown reaction   Atacand [Candesartan] Other (See Comments)    Unknown reaction - MAR   Bextra [Valdecoxib] Other (See Comments)    Unknown reaction - Per MAR   Bisoprolol-Hydrochlorothiazide Other (See Comments)    Unknown reaction - Per MAR   Cefadroxil Other (See Comments)    Unknown reaction   Celecoxib Rash   Hydrocodone Other (See Comments)    unknown   Hydrocodone-Acetaminophen Other (See Comments)    unknown   Iodinated Diagnostic Agents Rash and Other (See Comments)    "All over" rash   Meloxicam Other (See Comments)    unknown   Methylprednisolone Sodium Succinate Other (See Comments)    unknown   Nabumetone Other (See Comments)    Unknown reaction   Penicillins Other (See Comments)    unknown   Pseudoephedrine-Guaifenesin Other (See Comments)    unknown   Risedronate Sodium Other (See Comments)    unknown   Rofecoxib Other (See Comments)    Unknown reaction - MAR Other reaction(s): Unknown  Ru-Tuss [Chlorphen-Pse-Atrop-Hyos-Scop] Other (See Comments)    unknown   Sulfonamide Derivatives Other (See Comments)    unknown   Sulphur [Elemental Sulfur] Other (See Comments)    unknown   Telithromycin Other (See Comments)    unknown   Terfenadine Other (See Comments)    Unknown reaction  - Per MAR   Trandolapril-Verapamil Hcl Er Other (See Comments)    Headache, difficulty urinating, high blood sugar, fluid retention  Pt is taking Tarka (trandolapril-verapamil) currently, but requests the medication stay in her allergy list   Trandolapril-Verapamil Hcl Er Other (See Comments)    Headache, difficulty urinating, high blood sugar, fluid retention  Pt is taking Tarka (trandolapril-verapamil) currently, but requests the medication stay in her allergy list   Valium [Diazepam] Other (See Comments)    Makes her mean and hyper    Contact information for follow-up providers     Bartholome Bill, MD. Call today.   Specialty: Family Medicine Why: Please call for a posthospital follow-up appointment. Contact information: [place] [address] [phone]              Contact information for after-discharge care     Destination     [facility] Preferred SNF .   Service: Skilled Nursing Contact information: [address] [place] [phone]                      The results of significant diagnostics from this hospitalization (including imaging, microbiology, ancillary and laboratory) are listed below for reference.    Significant Diagnostic Studies: DG Chest 2 View  Result Date: 04/03/2021 CLINICAL DATA:  Cough.  Worsening weakness. EXAM: CHEST - 2 VIEW COMPARISON:  03/30/2021 FINDINGS: Heart size is normal. Chronic aortic tortuosity. There are mild bilateral patchy pulmonary infiltrates, right more than left, consistent with bronchopneumonia. No dense consolidation or lobar collapse. Old midthoracic vertebral body compression fractures incidentally noted. IMPRESSION: Mild patchy bilateral pulmonary infiltrates consistent with pneumonia, right more than left. Electronically Signed   By: Nelson Chimes M.D.   On: 04/03/2021 10:39   DG Chest 2 View  Result Date:  03/30/2021 CLINICAL DATA:  Tested positive for flu EXAM: CHEST - 2 VIEW COMPARISON:  Chest x-ray 08/19/2020. chest x-ray 06/04/2020, CT angio chest 04/01/2020, chest x-ray 10/14/2013 FINDINGS: The heart and mediastinal contours are unchanged. Aortic calcification. En face right hilar vasculature measuring 2.1 cm. No focal consolidation. No pulmonary edema. No pleural effusion. No pneumothorax. No acute osseous abnormality. IMPRESSION: No active cardiopulmonary disease. Electronically Signed   By: Iven Finn M.D.   On: 03/30/2021 23:36    Microbiology: Recent Results (from the past 240 hour(s))  Resp Panel by RT-PCR (Flu A&B, Covid) Nasopharyngeal Swab     Status: None   Collection Time: 03/30/21 10:40 PM   Specimen: Nasopharyngeal Swab; Nasopharyngeal(NP) swabs in vial transport medium  Result Value Ref Range Status   SARS Coronavirus 2 by RT PCR NEGATIVE NEGATIVE Final    Comment: (NOTE) SARS-CoV-2 target nucleic acids are NOT DETECTED.  The SARS-CoV-2 RNA is generally detectable in upper respiratory specimens during the acute phase of infection. The lowest concentration of SARS-CoV-2 viral copies this assay can detect is 138 copies/mL. A negative result does not preclude SARS-Cov-2 infection and should not be used as the sole basis for treatment or other patient management decisions. A negative result [place] occur with  improper specimen collection/handling, submission of specimen  other than nasopharyngeal swab, presence of viral mutation(s) within the areas targeted by this assay, and inadequate number of viral copies(<138 copies/mL). A negative result must be combined with clinical observations, patient history, and epidemiological information. The expected result is Negative.  Fact Sheet for Patients:  EntrepreneurPulse.com.au  Fact Sheet for Healthcare Providers:  IncredibleEmployment.be  This test is no t yet approved or cleared by the  [place] FDA and  has been authorized for detection and/or diagnosis of SARS-CoV-2 by FDA under an Emergency Use Authorization (EUA). This EUA will remain  in effect (meaning this test can be used) for the duration of the COVID-19 declaration under Section 564(b)(1) of the Act, 21 U.S.C.section 360bbb-3(b)(1), unless the authorization is terminated  or revoked sooner.       Influenza A by PCR NEGATIVE NEGATIVE Final   Influenza B by PCR NEGATIVE NEGATIVE Final    Comment: (NOTE) The Xpert Xpress SARS-CoV-2/FLU/RSV plus assay is intended as an aid in the diagnosis of influenza from Nasopharyngeal swab specimens and should not be used as a sole basis for treatment. Nasal washings and aspirates are unacceptable for Xpert Xpress SARS-CoV-2/FLU/RSV testing.  Fact Sheet for Patients: EntrepreneurPulse.com.au  Fact Sheet for Healthcare Providers: IncredibleEmployment.be  This test is not yet approved or cleared by the [place] FDA and has been authorized for detection and/or diagnosis of SARS-CoV-2 by FDA under an Emergency Use Authorization (EUA). This EUA will remain in effect (meaning this test can be used) for the duration of the COVID-19 declaration under Section 564(b)(1) of the Act, 21 U.S.C. section 360bbb-3(b)(1), unless the authorization is terminated or revoked.  Performed at [facility], [place] [address], [place], [place] [address]   Urine Culture     Status: Abnormal   Collection Time: 03/31/21  2:38 AM   Specimen: In/Out Cath Urine  Result Value Ref Range Status   Specimen Description   Final    IN/OUT CATH URINE Performed at [facility] [address], [place], [place] [address]    Special Requests   Final    NONE Performed at [facility], [place] [address] [place], [place] [address]    Culture >=100,000 COLONIES/mL KLEBSIELLA PNEUMONIAE (A)  Final   Report  Status 04/01/2021 FINAL  Final   Organism ID, Bacteria KLEBSIELLA PNEUMONIAE (A)  Final      Susceptibility   Klebsiella pneumoniae - MIC*    AMPICILLIN >=32 RESISTANT Resistant     CEFAZOLIN <=4 SENSITIVE Sensitive     CEFEPIME <=0.12 SENSITIVE Sensitive     CEFTRIAXONE <=0.25 SENSITIVE Sensitive     CIPROFLOXACIN <=0.25 SENSITIVE Sensitive     GENTAMICIN <=1 SENSITIVE Sensitive     IMIPENEM <=0.25 SENSITIVE Sensitive     NITROFURANTOIN 64 INTERMEDIATE Intermediate     TRIMETH/SULFA <=20 SENSITIVE Sensitive     AMPICILLIN/SULBACTAM <=2 SENSITIVE Sensitive     PIP/TAZO <=4 SENSITIVE Sensitive     * >=100,000 COLONIES/mL KLEBSIELLA PNEUMONIAE  Resp Panel by RT-PCR (Flu A&B, Covid)     Status: None   Collection Time: 04/03/21 10:56 AM  Result Value Ref Range Status   SARS Coronavirus 2 by RT PCR NEGATIVE NEGATIVE Final    Comment: (NOTE) SARS-CoV-2 target nucleic acids are NOT DETECTED.  The SARS-CoV-2 RNA is generally detectable in upper respiratory specimens during the acute phase of infection. The lowest concentration of SARS-CoV-2 viral copies this assay can detect is 138 copies/mL. A negative result does not preclude SARS-Cov-2  infection and should not be used as the sole basis for treatment or other patient management decisions. A negative result [place] occur with  improper specimen collection/handling, submission of specimen other than nasopharyngeal swab, presence of viral mutation(s) within the areas targeted by this assay, and inadequate number of viral copies(<138 copies/mL). A negative result must be combined with clinical observations, patient history, and epidemiological information. The expected result is Negative.  Fact Sheet for Patients:  EntrepreneurPulse.com.au  Fact Sheet for Healthcare Providers:  IncredibleEmployment.be  This test is no t yet approved or cleared by the [place] FDA and  has been authorized for  detection and/or diagnosis of SARS-CoV-2 by FDA under an Emergency Use Authorization (EUA). This EUA will remain  in effect (meaning this test can be used) for the duration of the COVID-19 declaration under Section 564(b)(1) of the Act, 21 U.S.C.section 360bbb-3(b)(1), unless the authorization is terminated  or revoked sooner.       Influenza A by PCR NEGATIVE NEGATIVE Final   Influenza B by PCR NEGATIVE NEGATIVE Final    Comment: (NOTE) The Xpert Xpress SARS-CoV-2/FLU/RSV plus assay is intended as an aid in the diagnosis of influenza from Nasopharyngeal swab specimens and should not be used as a sole basis for treatment. Nasal washings and aspirates are unacceptable for Xpert Xpress SARS-CoV-2/FLU/RSV testing.  Fact Sheet for Patients: EntrepreneurPulse.com.au  Fact Sheet for Healthcare Providers: IncredibleEmployment.be  This test is not yet approved or cleared by the [place] FDA and has been authorized for detection and/or diagnosis of SARS-CoV-2 by FDA under an Emergency Use Authorization (EUA). This EUA will remain in effect (meaning this test can be used) for the duration of the COVID-19 declaration under Section 564(b)(1) of the Act, 21 U.S.C. section 360bbb-3(b)(1), unless the authorization is terminated or revoked.  Performed at [facility], [place] [address] [place], [place] [address]   Culture, blood (routine x 2)     Status: None   Collection Time: 04/03/21 11:06 AM   Specimen: Left Antecubital; Blood  Result Value Ref Range Status   Specimen Description   Final    LEFT ANTECUBITAL Performed at [facility] [address], [place], [place] [address]    Special Requests   Final    BOTTLES DRAWN AEROBIC AND ANAEROBIC Blood Culture adequate volume Performed at [facility] [address], [place], [place] [address]    Culture   Final    NO GROWTH 5  DAYS Performed at [facility], [place] [address] [place], [place] [address]    Report Status 04/08/2021 FINAL  Final  Culture, blood (routine x 2)     Status: None   Collection Time: 04/03/21  1:33 PM   Specimen: BLOOD  Result Value Ref Range Status   Specimen Description   Final    BLOOD LEFT ANTECUBITAL Performed at [facility] [address] [place], [place] [address]    Special Requests   Final    BOTTLES DRAWN AEROBIC AND ANAEROBIC Blood Culture results [place] not be optimal due to an excessive volume of blood received in culture bottles Performed at [facility] [address] [place], [place] [address]    Culture   Final    NO GROWTH 5 DAYS Performed at [facility], [place] [address] [place], [place] [address]    Report Status 04/08/2021 FINAL  Final  Respiratory (~20 pathogens) panel by PCR  Status: None   Collection Time: 04/05/21  9:59 AM  Result Value Ref Range Status   Adenovirus NOT DETECTED NOT DETECTED Final   Coronavirus 229E NOT DETECTED NOT DETECTED Final    Comment: (NOTE) The Coronavirus on the Respiratory Panel, DOES NOT test for the novel  Coronavirus (2019 nCoV)    Coronavirus HKU1 NOT DETECTED NOT DETECTED Final   Coronavirus NL63 NOT DETECTED NOT DETECTED Final   Coronavirus OC43 NOT DETECTED NOT DETECTED Final   Metapneumovirus NOT DETECTED NOT DETECTED Final   Rhinovirus / Enterovirus NOT DETECTED NOT DETECTED Final   Influenza A NOT DETECTED NOT DETECTED Final   Influenza B NOT DETECTED NOT DETECTED Final   Parainfluenza Virus 1 NOT DETECTED NOT DETECTED Final   Parainfluenza Virus 2 NOT DETECTED NOT DETECTED Final   Parainfluenza Virus 3 NOT DETECTED NOT DETECTED Final   Parainfluenza Virus 4 NOT DETECTED NOT DETECTED Final   Respiratory Syncytial Virus NOT DETECTED NOT DETECTED Final   Bordetella pertussis NOT DETECTED NOT DETECTED Final   Bordetella Parapertussis NOT DETECTED NOT DETECTED  Final   Chlamydophila pneumoniae NOT DETECTED NOT DETECTED Final   Mycoplasma pneumoniae NOT DETECTED NOT DETECTED Final    Comment: Performed at [facility], [place] [address], [place], [place] [address]  Resp Panel by RT-PCR (Flu A&B, Covid) Nasopharyngeal Swab     Status: None   Collection Time: 04/07/21  3:41 PM   Specimen: Nasopharyngeal Swab; Nasopharyngeal(NP) swabs in vial transport medium  Result Value Ref Range Status   SARS Coronavirus 2 by RT PCR NEGATIVE NEGATIVE Final    Comment: (NOTE) SARS-CoV-2 target nucleic acids are NOT DETECTED.  The SARS-CoV-2 RNA is generally detectable in upper respiratory specimens during the acute phase of infection. The lowest concentration of SARS-CoV-2 viral copies this assay can detect is 138 copies/mL. A negative result does not preclude SARS-Cov-2 infection and should not be used as the sole basis for treatment or other patient management decisions. A negative result [place] occur with  improper specimen collection/handling, submission of specimen other than nasopharyngeal swab, presence of viral mutation(s) within the areas targeted by this assay, and inadequate number of viral copies(<138 copies/mL). A negative result must be combined with clinical observations, patient history, and epidemiological information. The expected result is Negative.  Fact Sheet for Patients:  EntrepreneurPulse.com.au  Fact Sheet for Healthcare Providers:  IncredibleEmployment.be  This test is no t yet approved or cleared by the [place] FDA and  has been authorized for detection and/or diagnosis of SARS-CoV-2 by FDA under an Emergency Use Authorization (EUA). This EUA will remain  in effect (meaning this test can be used) for the duration of the COVID-19 declaration under Section 564(b)(1) of the Act, 21 U.S.C.section 360bbb-3(b)(1), unless the authorization is terminated  or revoked sooner.       Influenza A  by PCR NEGATIVE NEGATIVE Final   Influenza B by PCR NEGATIVE NEGATIVE Final    Comment: (NOTE) The Xpert Xpress SARS-CoV-2/FLU/RSV plus assay is intended as an aid in the diagnosis of influenza from Nasopharyngeal swab specimens and should not be used as a sole basis for treatment. Nasal washings and aspirates are unacceptable for Xpert Xpress SARS-CoV-2/FLU/RSV testing.  Fact Sheet for Patients: EntrepreneurPulse.com.au  Fact Sheet for Healthcare Providers: IncredibleEmployment.be  This test is not yet approved or cleared by the [place] FDA and has been authorized for detection and/or diagnosis of SARS-CoV-2 by FDA under an Emergency Use Authorization (EUA). This EUA will  remain in effect (meaning this test can be used) for the duration of the COVID-19 declaration under Section 564(b)(1) of the Act, 21 U.S.C. section 360bbb-3(b)(1), unless the authorization is terminated or revoked.  Performed at [facility], [place] [address] [place], [place] [address]      Labs: Basic Metabolic Panel: Recent Labs  Lab 04/03/21 1106 04/03/21 1156 04/04/21 0500 04/05/21 0605 04/06/21 0545 04/07/21 0550  NA 128*  --  129* 129* 130* 132*  K 3.1*  --  4.7 3.6 3.1* 4.1  CL 102  --  108 106 103 106  CO2 18*  --  17* 18* 21* 21*  GLUCOSE 218*  --  208* 178* 187* 166*  BUN 9  --  8 6* <5* <5*  CREATININE 0.86  --  0.63 0.62 0.55 0.56  CALCIUM 8.0*  --  7.7* 7.6* 7.5* 7.6*  MG  --  1.6* 2.0 1.9  --   --   PHOS  --   --  1.2* 1.6* 2.9  --    Liver Function Tests: Recent Labs  Lab 04/03/21 1106 04/04/21 0500  AST 16 14*  ALT 15 15  ALKPHOS 112 112  BILITOT 0.5 0.7  PROT 6.8 5.6*  ALBUMIN 3.4* 2.9*   No results for input(s): LIPASE, AMYLASE in the last 168 hours. No results for input(s): AMMONIA in the last 168 hours. CBC: Recent Labs  Lab 04/03/21 1106 04/04/21 0500 04/06/21 0545  WBC 13.1* 9.1 7.5  NEUTROABS 9.8*   --   --   HGB 11.3* 9.8* 9.9*  HCT 33.9* 28.9* 28.2*  MCV 93.4 92.0 89.5  PLT 313 325 406*   Cardiac Enzymes: No results for input(s): CKTOTAL, CKMB, CKMBINDEX, TROPONINI in the last 168 hours. BNP: BNP (last 3 results) No results for input(s): BNP in the last 8760 hours.  ProBNP (last 3 results) No results for input(s): PROBNP in the last 8760 hours.  CBG: Recent Labs  Lab 04/07/21 0800 04/07/21 1150 04/07/21 1701 04/07/21 2004 04/08/21 0804  GLUCAP 164* 149* 195* 242* 187*       Signed:  Kayleen Memos, MD [facility] 04/08/2021, 10:35 AM

## 2021-04-08 NOTE — TOC Transition Note (Signed)
Transition of Care [facility]) - CM/SW Discharge Note   Patient Details  Name: [name] MRN: [id number] Date of Birth: [birth year]  [facility] CM/SW Contact:  Trish Mage, LCSW Phone Number: 04/08/2021, 10:24 AM   Clinical Narrative:   Patient who is stable for d/c will transfer to [facility] today.  Family informed.  PTAR arranged.  Nursing, please call report to [phone]. TOC sign off.    Final next level of care: [facility] [place] Barriers to Discharge: Barriers Resolved   Patient Goals and CMS Choice     Choice offered to / list presented to : Patient  Discharge Placement                       Discharge Plan and Services   Discharge Planning Services: CM Consult Post Acute Care Choice: [place]                               Social Determinants of Health (SDOH) Interventions     Readmission Risk Interventions Readmission Risk Prevention Plan 06/06/2020 03/15/2020 03/13/2020  Transportation Screening Complete Complete -  PCP or Specialist Appt within 3-5 Days - Complete Complete  HRI or Home Care Consult - - Complete  Social Work Consult for [place] Planning/Counseling - Complete Complete  Palliative Care Screening - Complete Complete  Medication Review Press photographer) - Complete Complete  PCP or Specialist appointment within 3-5 days of discharge Complete - -  SW Recovery Care/Counseling Consult Complete - -  Palliative Care Screening Not Applicable - -  Some recent data might be hidden

## 2021-04-09 DIAGNOSIS — J189 Pneumonia, unspecified organism: Secondary | ICD-10-CM | POA: Diagnosis not present

## 2021-04-09 DIAGNOSIS — R5381 Other malaise: Secondary | ICD-10-CM | POA: Diagnosis not present

## 2021-04-09 DIAGNOSIS — E1159 Type 2 diabetes mellitus with other circulatory complications: Secondary | ICD-10-CM | POA: Diagnosis not present

## 2021-04-09 DIAGNOSIS — I152 Hypertension secondary to endocrine disorders: Secondary | ICD-10-CM | POA: Diagnosis not present

## 2021-04-09 DIAGNOSIS — R41841 Cognitive communication deficit: Secondary | ICD-10-CM | POA: Insufficient documentation

## 2021-04-10 DIAGNOSIS — Z20822 Contact with and (suspected) exposure to covid-19: Secondary | ICD-10-CM | POA: Diagnosis not present

## 2021-04-18 DIAGNOSIS — E039 Hypothyroidism, unspecified: Secondary | ICD-10-CM | POA: Diagnosis not present

## 2021-04-18 DIAGNOSIS — R5381 Other malaise: Secondary | ICD-10-CM | POA: Diagnosis not present

## 2021-04-18 DIAGNOSIS — G40909 Epilepsy, unspecified, not intractable, without status epilepticus: Secondary | ICD-10-CM | POA: Diagnosis not present

## 2021-04-18 DIAGNOSIS — J189 Pneumonia, unspecified organism: Secondary | ICD-10-CM | POA: Diagnosis not present

## 2021-04-30 DIAGNOSIS — E440 Moderate protein-calorie malnutrition: Secondary | ICD-10-CM | POA: Diagnosis not present

## 2021-04-30 DIAGNOSIS — E1159 Type 2 diabetes mellitus with other circulatory complications: Secondary | ICD-10-CM | POA: Diagnosis not present

## 2021-04-30 DIAGNOSIS — I152 Hypertension secondary to endocrine disorders: Secondary | ICD-10-CM | POA: Diagnosis not present

## 2021-04-30 DIAGNOSIS — E1165 Type 2 diabetes mellitus with hyperglycemia: Secondary | ICD-10-CM | POA: Diagnosis not present

## 2021-05-05 DIAGNOSIS — J189 Pneumonia, unspecified organism: Secondary | ICD-10-CM | POA: Diagnosis not present

## 2021-05-05 DIAGNOSIS — E1159 Type 2 diabetes mellitus with other circulatory complications: Secondary | ICD-10-CM | POA: Diagnosis not present

## 2021-05-05 DIAGNOSIS — R197 Diarrhea, unspecified: Secondary | ICD-10-CM | POA: Diagnosis not present

## 2021-05-05 DIAGNOSIS — I700 Atherosclerosis of aorta: Secondary | ICD-10-CM | POA: Diagnosis not present

## 2021-05-05 DIAGNOSIS — E872 Acidosis, unspecified: Secondary | ICD-10-CM | POA: Diagnosis not present

## 2021-05-05 DIAGNOSIS — G40909 Epilepsy, unspecified, not intractable, without status epilepticus: Secondary | ICD-10-CM | POA: Diagnosis not present

## 2021-05-05 DIAGNOSIS — G250 Essential tremor: Secondary | ICD-10-CM | POA: Diagnosis not present

## 2021-05-05 DIAGNOSIS — M159 Polyosteoarthritis, unspecified: Secondary | ICD-10-CM | POA: Diagnosis not present

## 2021-05-05 DIAGNOSIS — E440 Moderate protein-calorie malnutrition: Secondary | ICD-10-CM | POA: Diagnosis not present

## 2021-05-05 DIAGNOSIS — E785 Hyperlipidemia, unspecified: Secondary | ICD-10-CM | POA: Diagnosis not present

## 2021-05-05 DIAGNOSIS — I10 Essential (primary) hypertension: Secondary | ICD-10-CM | POA: Diagnosis not present

## 2021-05-05 DIAGNOSIS — Z794 Long term (current) use of insulin: Secondary | ICD-10-CM | POA: Diagnosis not present

## 2021-05-05 DIAGNOSIS — E039 Hypothyroidism, unspecified: Secondary | ICD-10-CM | POA: Diagnosis not present

## 2021-05-05 DIAGNOSIS — Z8781 Personal history of (healed) traumatic fracture: Secondary | ICD-10-CM | POA: Diagnosis not present

## 2021-05-05 DIAGNOSIS — F418 Other specified anxiety disorders: Secondary | ICD-10-CM | POA: Diagnosis not present

## 2021-05-05 DIAGNOSIS — Z9181 History of falling: Secondary | ICD-10-CM | POA: Diagnosis not present

## 2021-05-07 DIAGNOSIS — L814 Other melanin hyperpigmentation: Secondary | ICD-10-CM | POA: Diagnosis not present

## 2021-05-07 DIAGNOSIS — L570 Actinic keratosis: Secondary | ICD-10-CM | POA: Diagnosis not present

## 2021-05-07 DIAGNOSIS — D492 Neoplasm of unspecified behavior of bone, soft tissue, and skin: Secondary | ICD-10-CM | POA: Diagnosis not present

## 2021-05-07 DIAGNOSIS — C44722 Squamous cell carcinoma of skin of right lower limb, including hip: Secondary | ICD-10-CM | POA: Diagnosis not present

## 2021-05-07 DIAGNOSIS — L821 Other seborrheic keratosis: Secondary | ICD-10-CM | POA: Diagnosis not present

## 2021-05-07 DIAGNOSIS — Z85828 Personal history of other malignant neoplasm of skin: Secondary | ICD-10-CM | POA: Diagnosis not present

## 2021-05-07 DIAGNOSIS — C44729 Squamous cell carcinoma of skin of left lower limb, including hip: Secondary | ICD-10-CM | POA: Diagnosis not present

## 2021-05-07 DIAGNOSIS — Z08 Encounter for follow-up examination after completed treatment for malignant neoplasm: Secondary | ICD-10-CM | POA: Diagnosis not present

## 2021-05-07 DIAGNOSIS — D225 Melanocytic nevi of trunk: Secondary | ICD-10-CM | POA: Diagnosis not present

## 2021-05-08 DIAGNOSIS — J189 Pneumonia, unspecified organism: Secondary | ICD-10-CM | POA: Diagnosis not present

## 2021-05-08 DIAGNOSIS — E1159 Type 2 diabetes mellitus with other circulatory complications: Secondary | ICD-10-CM | POA: Diagnosis not present

## 2021-05-08 DIAGNOSIS — E872 Acidosis, unspecified: Secondary | ICD-10-CM | POA: Diagnosis not present

## 2021-05-08 DIAGNOSIS — G250 Essential tremor: Secondary | ICD-10-CM | POA: Diagnosis not present

## 2021-05-08 DIAGNOSIS — I10 Essential (primary) hypertension: Secondary | ICD-10-CM | POA: Diagnosis not present

## 2021-05-08 DIAGNOSIS — E785 Hyperlipidemia, unspecified: Secondary | ICD-10-CM | POA: Diagnosis not present

## 2021-05-12 ENCOUNTER — Telehealth: Payer: Self-pay | Admitting: Neurology

## 2021-05-12 DIAGNOSIS — G250 Essential tremor: Secondary | ICD-10-CM | POA: Diagnosis not present

## 2021-05-12 DIAGNOSIS — E1159 Type 2 diabetes mellitus with other circulatory complications: Secondary | ICD-10-CM | POA: Diagnosis not present

## 2021-05-12 DIAGNOSIS — E872 Acidosis, unspecified: Secondary | ICD-10-CM | POA: Diagnosis not present

## 2021-05-12 DIAGNOSIS — J189 Pneumonia, unspecified organism: Secondary | ICD-10-CM | POA: Diagnosis not present

## 2021-05-12 DIAGNOSIS — I10 Essential (primary) hypertension: Secondary | ICD-10-CM | POA: Diagnosis not present

## 2021-05-12 DIAGNOSIS — E785 Hyperlipidemia, unspecified: Secondary | ICD-10-CM | POA: Diagnosis not present

## 2021-05-12 NOTE — Telephone Encounter (Signed)
Pt asking if primidone (MYSOLINE) 250 MG tabletcan be taken in morning instead of at night and if can take a smaller dosage;100 mg at night. Would like a call from the nurse.

## 2021-05-12 NOTE — Telephone Encounter (Signed)
Please call pt and offer a f/u visit with Dr. Leta Baptist on 05/14/21 at 200 pm or 230 pm. This is a new to Dr. Leta Baptist and visit would be needed to discuss med changes.

## 2021-05-14 ENCOUNTER — Telehealth: Payer: Self-pay | Admitting: *Deleted

## 2021-05-14 DIAGNOSIS — I10 Essential (primary) hypertension: Secondary | ICD-10-CM | POA: Diagnosis not present

## 2021-05-14 DIAGNOSIS — J189 Pneumonia, unspecified organism: Secondary | ICD-10-CM | POA: Diagnosis not present

## 2021-05-14 DIAGNOSIS — E872 Acidosis, unspecified: Secondary | ICD-10-CM | POA: Diagnosis not present

## 2021-05-14 DIAGNOSIS — G250 Essential tremor: Secondary | ICD-10-CM | POA: Diagnosis not present

## 2021-05-14 DIAGNOSIS — E1159 Type 2 diabetes mellitus with other circulatory complications: Secondary | ICD-10-CM | POA: Diagnosis not present

## 2021-05-14 DIAGNOSIS — E785 Hyperlipidemia, unspecified: Secondary | ICD-10-CM | POA: Diagnosis not present

## 2021-05-14 NOTE — Telephone Encounter (Signed)
An appointment was made for diabetic shoe pick up,under casting schedule,dispensed the shoes and thru that schedule the shoes were tried on by patient, explained caring, wear of the shoes.

## 2021-05-14 NOTE — Telephone Encounter (Signed)
-----   Message from Weyman Pedro, RN sent at 05/13/2021 10:36 AM EST ----- An appointment should have been made for PUDS and a note should have been documented within the appointment encounter. I do not see why this was not done properly or who dispensed the shoes without an appointment.  ----- Message ----- From: Karsten Ro Sent: 05/13/2021  10:08 AM EST To: Marzetta Board, DPM, #  Good morning. Medicare is beginning to ask for records more and more on diabetic shoes. Patient picked up shoes on 01-13-21 (charges for shoes on this date) and the notes are not sufficient for Medicare to approve this. Only note in system for 9-12 is a telephone msg that says PUDS. We need something with much more detail for the shoes to be covered. Can someone update record and let me know when complete and ready to send to Medicare please? Thank you!

## 2021-05-15 DIAGNOSIS — J189 Pneumonia, unspecified organism: Secondary | ICD-10-CM | POA: Diagnosis not present

## 2021-05-15 DIAGNOSIS — I10 Essential (primary) hypertension: Secondary | ICD-10-CM | POA: Diagnosis not present

## 2021-05-15 DIAGNOSIS — E1159 Type 2 diabetes mellitus with other circulatory complications: Secondary | ICD-10-CM | POA: Diagnosis not present

## 2021-05-15 DIAGNOSIS — E785 Hyperlipidemia, unspecified: Secondary | ICD-10-CM | POA: Diagnosis not present

## 2021-05-15 DIAGNOSIS — E872 Acidosis, unspecified: Secondary | ICD-10-CM | POA: Diagnosis not present

## 2021-05-15 DIAGNOSIS — G250 Essential tremor: Secondary | ICD-10-CM | POA: Diagnosis not present

## 2021-05-20 DIAGNOSIS — I10 Essential (primary) hypertension: Secondary | ICD-10-CM | POA: Diagnosis not present

## 2021-05-20 DIAGNOSIS — E785 Hyperlipidemia, unspecified: Secondary | ICD-10-CM | POA: Diagnosis not present

## 2021-05-20 DIAGNOSIS — E1159 Type 2 diabetes mellitus with other circulatory complications: Secondary | ICD-10-CM | POA: Diagnosis not present

## 2021-05-20 DIAGNOSIS — G250 Essential tremor: Secondary | ICD-10-CM | POA: Diagnosis not present

## 2021-05-20 DIAGNOSIS — J189 Pneumonia, unspecified organism: Secondary | ICD-10-CM | POA: Diagnosis not present

## 2021-05-20 DIAGNOSIS — E872 Acidosis, unspecified: Secondary | ICD-10-CM | POA: Diagnosis not present

## 2021-05-21 DIAGNOSIS — I10 Essential (primary) hypertension: Secondary | ICD-10-CM | POA: Diagnosis not present

## 2021-05-21 DIAGNOSIS — J189 Pneumonia, unspecified organism: Secondary | ICD-10-CM | POA: Diagnosis not present

## 2021-05-21 DIAGNOSIS — G250 Essential tremor: Secondary | ICD-10-CM | POA: Diagnosis not present

## 2021-05-21 DIAGNOSIS — E785 Hyperlipidemia, unspecified: Secondary | ICD-10-CM | POA: Diagnosis not present

## 2021-05-21 DIAGNOSIS — E872 Acidosis, unspecified: Secondary | ICD-10-CM | POA: Diagnosis not present

## 2021-05-21 DIAGNOSIS — E1159 Type 2 diabetes mellitus with other circulatory complications: Secondary | ICD-10-CM | POA: Diagnosis not present

## 2021-05-22 DIAGNOSIS — E872 Acidosis, unspecified: Secondary | ICD-10-CM | POA: Diagnosis not present

## 2021-05-22 DIAGNOSIS — K921 Melena: Secondary | ICD-10-CM | POA: Diagnosis not present

## 2021-05-22 DIAGNOSIS — J189 Pneumonia, unspecified organism: Secondary | ICD-10-CM | POA: Diagnosis not present

## 2021-05-22 DIAGNOSIS — G250 Essential tremor: Secondary | ICD-10-CM | POA: Diagnosis not present

## 2021-05-22 DIAGNOSIS — E785 Hyperlipidemia, unspecified: Secondary | ICD-10-CM | POA: Diagnosis not present

## 2021-05-22 DIAGNOSIS — I10 Essential (primary) hypertension: Secondary | ICD-10-CM | POA: Diagnosis not present

## 2021-05-22 DIAGNOSIS — E1159 Type 2 diabetes mellitus with other circulatory complications: Secondary | ICD-10-CM | POA: Diagnosis not present

## 2021-05-23 ENCOUNTER — Telehealth: Payer: Self-pay | Admitting: *Deleted

## 2021-05-23 NOTE — Telephone Encounter (Signed)
Patient is wanting to change providers due to wait time. Please schedule with another provider.

## 2021-05-26 DIAGNOSIS — K580 Irritable bowel syndrome with diarrhea: Secondary | ICD-10-CM | POA: Diagnosis not present

## 2021-05-26 DIAGNOSIS — Z85038 Personal history of other malignant neoplasm of large intestine: Secondary | ICD-10-CM | POA: Diagnosis not present

## 2021-05-26 DIAGNOSIS — I10 Essential (primary) hypertension: Secondary | ICD-10-CM | POA: Diagnosis not present

## 2021-05-26 DIAGNOSIS — R195 Other fecal abnormalities: Secondary | ICD-10-CM | POA: Diagnosis not present

## 2021-05-26 DIAGNOSIS — D649 Anemia, unspecified: Secondary | ICD-10-CM | POA: Diagnosis not present

## 2021-05-27 DIAGNOSIS — E785 Hyperlipidemia, unspecified: Secondary | ICD-10-CM | POA: Diagnosis not present

## 2021-05-27 DIAGNOSIS — E872 Acidosis, unspecified: Secondary | ICD-10-CM | POA: Diagnosis not present

## 2021-05-27 DIAGNOSIS — I10 Essential (primary) hypertension: Secondary | ICD-10-CM | POA: Diagnosis not present

## 2021-05-27 DIAGNOSIS — J189 Pneumonia, unspecified organism: Secondary | ICD-10-CM | POA: Diagnosis not present

## 2021-05-27 DIAGNOSIS — E1159 Type 2 diabetes mellitus with other circulatory complications: Secondary | ICD-10-CM | POA: Diagnosis not present

## 2021-05-27 DIAGNOSIS — G250 Essential tremor: Secondary | ICD-10-CM | POA: Diagnosis not present

## 2021-05-28 ENCOUNTER — Other Ambulatory Visit: Payer: Self-pay

## 2021-05-28 ENCOUNTER — Ambulatory Visit (INDEPENDENT_AMBULATORY_CARE_PROVIDER_SITE_OTHER): Payer: Medicare Other | Admitting: Podiatry

## 2021-05-28 ENCOUNTER — Encounter: Payer: Self-pay | Admitting: Podiatry

## 2021-05-28 DIAGNOSIS — L84 Corns and callosities: Secondary | ICD-10-CM | POA: Diagnosis not present

## 2021-05-28 DIAGNOSIS — M79674 Pain in right toe(s): Secondary | ICD-10-CM

## 2021-05-28 DIAGNOSIS — M79675 Pain in left toe(s): Secondary | ICD-10-CM | POA: Diagnosis not present

## 2021-05-28 DIAGNOSIS — B351 Tinea unguium: Secondary | ICD-10-CM | POA: Diagnosis not present

## 2021-05-28 DIAGNOSIS — E1142 Type 2 diabetes mellitus with diabetic polyneuropathy: Secondary | ICD-10-CM | POA: Diagnosis not present

## 2021-05-28 NOTE — Progress Notes (Signed)
Subjective:  Patient ID: [name], female    DOB: [birth year],   MRN: [id number]  Chief Complaint  Patient presents with   Nail Problem    Pt is here for [facility] and callus    88 y.o. female presents for concern of thickened elongated and painful nails that are difficult to trim. Requesting to have them trimmed today. Denies any burning or tingling in her feet. Patient is diabetic and last A1c was 6.6.  . Denies any other pedal complaints. Denies n/v/f/c.   PCP: Arn Medal MD   Past Medical History:  Diagnosis Date   Anemia    Anxiety    Asthma    Breast cancer ([place]) 2014   right breast   Cancer of right breast ([place]) 12/26/12   right breast 12:00 o'clock, DCIS   Carotid artery disease ([facility])    Carpal tunnel syndrome, bilateral    Chronic bronchitis ([facility])    Chronic cough    Chronic facial pain    Chronic foot pain    Colon cancer ([place]) dx'd 10/3014   Complication of anesthesia    Sore jaw; could not chew or move mouth, prolonged sedation   Convulsions/seizures ([place]) 10/16/2014   Diabetes mellitus    type 2 niddm x 20 years   Dyslipidemia    Ejection fraction    Gait abnormality 12/04/2019   GERD (gastroesophageal reflux disease)    Hammer toe    bilateral   History of colonic polyps    History of meningioma    HTN (hypertension)    Hx of radiation therapy 03/07/13- 03/29/13   right breast 4250 cGy 17 sessions   Hyperlipidemia    Hypokalemia    Hyponatremia    Hypothyroidism    IBS (irritable bowel syndrome)    Melanoma ([place])    Metatarsal bone fracture 2014   Multiple drug allergies    Nontoxic thyroid nodule    Obesity    Osteoarthritis    Osteoporosis    Palpitations    Personal history of radiation therapy 2014   Seasonal allergies    Skin cancer    Syncope    Tremor, essential 08/18/2016   Vitamin B12 deficiency    Vitamin D deficiency     Objective:  Physical Exam: Vascular: DP/PT pulses 2/4 bilateral. CFT <3 seconds. Normal hair growth on digits.  No edema.  Skin. No lacerations or abrasions bilateral feet. Nails 1-5 are thickened discolored and elongated with subungual debris.  Hyperkeratotic tissue noted to bilateral plantar first metatarsal.  Musculoskeletal: MMT 5/5 bilateral lower extremities in DF, PF, Inversion and Eversion. Deceased ROM in DF of ankle joint.  Neurological: Sensation intact to light touch.   Assessment:   1. Pain due to onychomycosis of toenails of both feet   2. Diabetic peripheral neuropathy associated with type 2 diabetes mellitus ([place])   3. Corns and callosities      Plan:  Patient was evaluated and treated and all questions answered. -Discussed and educated patient on diabetic foot care, especially with  regards to the vascular, neurological and musculoskeletal systems.  -Stressed the importance of good glycemic control and the detriment of not  controlling glucose levels in relation to the foot. -Discussed supportive shoes at all times and checking feet regularly.  -Mechanically debrided all nails 1-5 bilateral using sterile nail nipper and filed with dremel without incident  -Answered all patient questions -Patient to return  in 3 months for at risk foot care -Patient advised to call  the office if any problems or questions arise in the meantime.   Lorenda Peck, DPM

## 2021-05-28 NOTE — Progress Notes (Signed)
Dispensed one pair diabetic shoes [employer] M[employer], Karle Starch style (Women's size 39M) and 3 pair total of custom molded diabetic insoles. Shoes and inserts were appropriate fit with no heel slippage. Reviewed warranty information and patient signed all paperwork stating patient received shoes, insert(s)/filler(s), break-in instructions and warranty information. Patient instructed not to wear shoes outside unless completely satisfied. Patient related understanding.

## 2021-05-29 DIAGNOSIS — I10 Essential (primary) hypertension: Secondary | ICD-10-CM | POA: Diagnosis not present

## 2021-05-29 DIAGNOSIS — G250 Essential tremor: Secondary | ICD-10-CM | POA: Diagnosis not present

## 2021-05-29 DIAGNOSIS — J189 Pneumonia, unspecified organism: Secondary | ICD-10-CM | POA: Diagnosis not present

## 2021-05-29 DIAGNOSIS — E872 Acidosis, unspecified: Secondary | ICD-10-CM | POA: Diagnosis not present

## 2021-05-29 DIAGNOSIS — E1159 Type 2 diabetes mellitus with other circulatory complications: Secondary | ICD-10-CM | POA: Diagnosis not present

## 2021-05-29 DIAGNOSIS — E785 Hyperlipidemia, unspecified: Secondary | ICD-10-CM | POA: Diagnosis not present

## 2021-06-02 ENCOUNTER — Ambulatory Visit: Payer: Medicare Other | Admitting: Podiatry

## 2021-06-03 DIAGNOSIS — J189 Pneumonia, unspecified organism: Secondary | ICD-10-CM | POA: Diagnosis not present

## 2021-06-03 DIAGNOSIS — I10 Essential (primary) hypertension: Secondary | ICD-10-CM | POA: Diagnosis not present

## 2021-06-03 DIAGNOSIS — E872 Acidosis, unspecified: Secondary | ICD-10-CM | POA: Diagnosis not present

## 2021-06-03 DIAGNOSIS — G250 Essential tremor: Secondary | ICD-10-CM | POA: Diagnosis not present

## 2021-06-03 DIAGNOSIS — E785 Hyperlipidemia, unspecified: Secondary | ICD-10-CM | POA: Diagnosis not present

## 2021-06-03 DIAGNOSIS — E1159 Type 2 diabetes mellitus with other circulatory complications: Secondary | ICD-10-CM | POA: Diagnosis not present

## 2021-06-04 DIAGNOSIS — E872 Acidosis, unspecified: Secondary | ICD-10-CM | POA: Diagnosis not present

## 2021-06-04 DIAGNOSIS — J189 Pneumonia, unspecified organism: Secondary | ICD-10-CM | POA: Diagnosis not present

## 2021-06-04 DIAGNOSIS — E039 Hypothyroidism, unspecified: Secondary | ICD-10-CM | POA: Diagnosis not present

## 2021-06-04 DIAGNOSIS — E440 Moderate protein-calorie malnutrition: Secondary | ICD-10-CM | POA: Diagnosis not present

## 2021-06-04 DIAGNOSIS — M159 Polyosteoarthritis, unspecified: Secondary | ICD-10-CM | POA: Diagnosis not present

## 2021-06-04 DIAGNOSIS — Z9181 History of falling: Secondary | ICD-10-CM | POA: Diagnosis not present

## 2021-06-04 DIAGNOSIS — I700 Atherosclerosis of aorta: Secondary | ICD-10-CM | POA: Diagnosis not present

## 2021-06-04 DIAGNOSIS — Z8781 Personal history of (healed) traumatic fracture: Secondary | ICD-10-CM | POA: Diagnosis not present

## 2021-06-04 DIAGNOSIS — I10 Essential (primary) hypertension: Secondary | ICD-10-CM | POA: Diagnosis not present

## 2021-06-04 DIAGNOSIS — F418 Other specified anxiety disorders: Secondary | ICD-10-CM | POA: Diagnosis not present

## 2021-06-04 DIAGNOSIS — Z794 Long term (current) use of insulin: Secondary | ICD-10-CM | POA: Diagnosis not present

## 2021-06-04 DIAGNOSIS — R197 Diarrhea, unspecified: Secondary | ICD-10-CM | POA: Diagnosis not present

## 2021-06-04 DIAGNOSIS — E785 Hyperlipidemia, unspecified: Secondary | ICD-10-CM | POA: Diagnosis not present

## 2021-06-04 DIAGNOSIS — E1159 Type 2 diabetes mellitus with other circulatory complications: Secondary | ICD-10-CM | POA: Diagnosis not present

## 2021-06-04 DIAGNOSIS — G250 Essential tremor: Secondary | ICD-10-CM | POA: Diagnosis not present

## 2021-06-04 DIAGNOSIS — G40909 Epilepsy, unspecified, not intractable, without status epilepticus: Secondary | ICD-10-CM | POA: Diagnosis not present

## 2021-06-09 DIAGNOSIS — E1140 Type 2 diabetes mellitus with diabetic neuropathy, unspecified: Secondary | ICD-10-CM | POA: Diagnosis not present

## 2021-06-09 DIAGNOSIS — E611 Iron deficiency: Secondary | ICD-10-CM | POA: Diagnosis not present

## 2021-06-09 DIAGNOSIS — M810 Age-related osteoporosis without current pathological fracture: Secondary | ICD-10-CM | POA: Diagnosis not present

## 2021-06-09 DIAGNOSIS — R296 Repeated falls: Secondary | ICD-10-CM | POA: Diagnosis not present

## 2021-06-09 DIAGNOSIS — E559 Vitamin D deficiency, unspecified: Secondary | ICD-10-CM | POA: Diagnosis not present

## 2021-06-09 DIAGNOSIS — E1365 Other specified diabetes mellitus with hyperglycemia: Secondary | ICD-10-CM | POA: Diagnosis not present

## 2021-06-09 DIAGNOSIS — E039 Hypothyroidism, unspecified: Secondary | ICD-10-CM | POA: Diagnosis not present

## 2021-06-09 DIAGNOSIS — Z87310 Personal history of (healed) osteoporosis fracture: Secondary | ICD-10-CM | POA: Diagnosis not present

## 2021-06-09 DIAGNOSIS — Z9181 History of falling: Secondary | ICD-10-CM | POA: Diagnosis not present

## 2021-06-09 DIAGNOSIS — M8000XS Age-related osteoporosis with current pathological fracture, unspecified site, sequela: Secondary | ICD-10-CM | POA: Diagnosis not present

## 2021-06-09 DIAGNOSIS — Z794 Long term (current) use of insulin: Secondary | ICD-10-CM | POA: Diagnosis not present

## 2021-06-09 DIAGNOSIS — Z79890 Hormone replacement therapy (postmenopausal): Secondary | ICD-10-CM | POA: Diagnosis not present

## 2021-06-12 DIAGNOSIS — J189 Pneumonia, unspecified organism: Secondary | ICD-10-CM | POA: Diagnosis not present

## 2021-06-12 DIAGNOSIS — G250 Essential tremor: Secondary | ICD-10-CM | POA: Diagnosis not present

## 2021-06-12 DIAGNOSIS — E785 Hyperlipidemia, unspecified: Secondary | ICD-10-CM | POA: Diagnosis not present

## 2021-06-12 DIAGNOSIS — E1159 Type 2 diabetes mellitus with other circulatory complications: Secondary | ICD-10-CM | POA: Diagnosis not present

## 2021-06-12 DIAGNOSIS — E872 Acidosis, unspecified: Secondary | ICD-10-CM | POA: Diagnosis not present

## 2021-06-12 DIAGNOSIS — I10 Essential (primary) hypertension: Secondary | ICD-10-CM | POA: Diagnosis not present

## 2021-06-16 DIAGNOSIS — L570 Actinic keratosis: Secondary | ICD-10-CM | POA: Diagnosis not present

## 2021-06-16 DIAGNOSIS — D0472 Carcinoma in situ of skin of left lower limb, including hip: Secondary | ICD-10-CM | POA: Diagnosis not present

## 2021-06-16 DIAGNOSIS — Z85828 Personal history of other malignant neoplasm of skin: Secondary | ICD-10-CM | POA: Diagnosis not present

## 2021-06-16 DIAGNOSIS — L578 Other skin changes due to chronic exposure to nonionizing radiation: Secondary | ICD-10-CM | POA: Diagnosis not present

## 2021-06-16 DIAGNOSIS — Z08 Encounter for follow-up examination after completed treatment for malignant neoplasm: Secondary | ICD-10-CM | POA: Diagnosis not present

## 2021-06-17 DIAGNOSIS — J189 Pneumonia, unspecified organism: Secondary | ICD-10-CM | POA: Diagnosis not present

## 2021-06-17 DIAGNOSIS — G250 Essential tremor: Secondary | ICD-10-CM | POA: Diagnosis not present

## 2021-06-17 DIAGNOSIS — E785 Hyperlipidemia, unspecified: Secondary | ICD-10-CM | POA: Diagnosis not present

## 2021-06-17 DIAGNOSIS — E1159 Type 2 diabetes mellitus with other circulatory complications: Secondary | ICD-10-CM | POA: Diagnosis not present

## 2021-06-17 DIAGNOSIS — E872 Acidosis, unspecified: Secondary | ICD-10-CM | POA: Diagnosis not present

## 2021-06-17 DIAGNOSIS — I10 Essential (primary) hypertension: Secondary | ICD-10-CM | POA: Diagnosis not present

## 2021-06-23 DIAGNOSIS — M1712 Unilateral primary osteoarthritis, left knee: Secondary | ICD-10-CM | POA: Diagnosis not present

## 2021-06-24 ENCOUNTER — Encounter: Payer: Self-pay | Admitting: Diagnostic Neuroimaging

## 2021-06-24 ENCOUNTER — Ambulatory Visit (INDEPENDENT_AMBULATORY_CARE_PROVIDER_SITE_OTHER): Payer: Medicare Other | Admitting: Diagnostic Neuroimaging

## 2021-06-24 VITALS — BP 134/74 | HR 69 | Ht 67.5 in | Wt 143.0 lb

## 2021-06-24 DIAGNOSIS — G40909 Epilepsy, unspecified, not intractable, without status epilepticus: Secondary | ICD-10-CM

## 2021-06-24 MED ORDER — PRIMIDONE 250 MG PO TABS: 250.0000 mg | ORAL_TABLET | Freq: Every day | ORAL | 4 refills | Status: DC

## 2021-06-24 MED ORDER — PHENYTOIN SODIUM EXTENDED 200 MG PO CAPS: 200.0000 mg | ORAL_CAPSULE | Freq: Every day | ORAL | 4 refills | Status: DC

## 2021-06-24 NOTE — Progress Notes (Signed)
[facility]  PATIENT: [name] DOB: [birth year]  REFERRING CLINICIAN: Bartholome Bill, MD HISTORY FROM: patient and niece REASON FOR VISIT: follow up   HISTORICAL  CHIEF COMPLAINT:  Chief Complaint  Patient presents with   Follow-up    Rm 7 with Niece here for 6 month f/u TOC from Dr. Jannifer [name]- Reports she has been doing well sx are stable.     HISTORY OF PRESENT ILLNESS:   UPDATE (06/24/21, VRP): Since last visit, doing well. Symptoms are stable. No seizures. No alleviating or aggravating factors. Tolerating meds.   PRIOR HPI (12/24/20; Dr. Jannifer [name]): "[name] is an 88 year old right-handed white female with a history of a seizure disorder, her last seizure was on 19 Sheller 2022.  The patient was in an extended care facility at that time and she is not completely sure that she was getting her medications properly.  She has a chronic gait disorder, she has fallen on occasion, she has not fallen recently.  She uses a walker to get around.  She has an essential tremor that affects her ability to feed herself at times.  She is on Mysoline taking 150 mg in the morning and 100 mg in the evening.  She has history of diabetes.  She continues to be concerned about the tremor.  She wishes to have something done about this issue.  She questions whether ultrasound ablation of the brain Dwan be something she could consider."  PRIOR HPI (08/15/14, VRP): 88 year old female here for evaluation of tongue laceration and possible seizures. In 2015 patient was having intermittent episodes of falling down. There is question of whether she had unresponsiveness with these events. She was initially evaluated for syncopal workup with heart testing. Ultimately she was admitted to the hospital in June 2015 for confusion, altered mental status, recurrent falls. She's found to have a large right frontal meningioma and right subdural hematoma. Patient was treated with surgical resection, Decadron and  antiseizure medication. Neurology consult was obtained who felt patient was also having intermittent complex partial seizures. She was discharged on Keppra 500 mg twice a day. 2 weeks after discharge patient stopped his medication on her own due to side effects of jitteriness, dizziness.   Patient continues to live on her own, continues to have intermittent falls and confusion spells. In 07/18/2014 patient woke up with large amount of blood on her pillow and bed sheets. She had a severe tongue laceration. No muscle aches, incontinence, confusion. The next day patient went to the hospital for kyphoplasty procedure. On 07/20/2014 patient went to the emergency room for evaluation of possible infection of tongue laceration. She followed up with ENT Dr. Constance [name]. Due to history of meningioma and seizures, patient was referred back to me for consultation.   Patient still drives, short distances to church and grocery store. She is also had lower extremity weakness and numbness. She has diabetes.   REVIEW OF SYSTEMS: Full 14 system review of systems performed and negative with exception of: as per HPI.  ALLERGIES: Allergies  Allergen Reactions   Bystolic [Nebivolol Hcl] Other (See Comments)    "extreme weakness, heaviness in legs & arms, swelling in legs/arms/face, swollen abdomen, pain in bladder, feet pain, soreness in chest"   Carbamazepine Other (See Comments)    Blood poisoning   Cholestyramine Itching, Nausea And Vomiting, Rash and Other (See Comments)    "itching rash on stomach, bloated, nausea, vomiting, sleeplessness, extreme pain in arms"  Patient is taking this in 2022.  Morphine Other (See Comments)    Feels morbid, weak, still in pain   Niaspan [Niacin Er] Palpitations and Other (See Comments)    "fast heart beat, high blood pressure"   Norvasc [Amlodipine Besylate] Other (See Comments)    "extreme fluid retention/pain)   Optivar [Azelastine Hcl] Photosensitivity   Repaglinide Hives    Sular [Nisoldipine Er] Other (See Comments)    "severe headaches, swelling eyes, hands, feet, shortness of breath, weak, flushed face, brain boiling, fluid retention, high blood sugar, nervous, heart fast beating"   Telmisartan Other (See Comments)    "headache, difficulty urinating, high blood sugar, fluid retention"   Amoxicillin-Pot Clavulanate Rash    Tolerates Unasyn (>10 doses)   Cefdinir Swelling    Vaginal irritation, breathing,    Ciprofloxacin Hcl Hives   Clonidine Other (See Comments)    Dry mouth, fluid retention   Clonidine Hydrochloride Other (See Comments)    Dry mouth, fluid retention   Codeine Nausea And Vomiting   Ezetimibe Other (See Comments)    Made weak   Hydroxychloroquine Other (See Comments)    Low platlets   Naproxen Other (See Comments)    Shrinks bladder   Sulfa Antibiotics Rash   Ziac [Bisoprolol-Hydrochlorothiazide] Other (See Comments)    "stopped urination"   Amlodipine Besy-Benazepril Hcl Cough   Azelastine Other (See Comments)    Unknown reaction - Per MAR   Elavil [Amitriptyline] Other (See Comments)    Gave Pt nightmares   Empagliflozin Other (See Comments)    "Caused yeast infection, slowed my urine"   Hydralazine Other (See Comments)    "does not reduce high blood pressure, pain in arm, high pressure, felt like I was on verge of heart attack, really weak"   Iodine I 131 Tositumomab Other (See Comments)    Unknown reaction - Per MAR   Kenalog [Triamcinolone] Diarrhea    Per MAR   Keppra [Levetiracetam] Other (See Comments)    Shaking   Lamotrigine Itching and Other (See Comments)   Pregabalin Swelling and Other (See Comments)    Weight gain   Pseudoephedrine Other (See Comments)    Unknown reaction   Pseudoephedrine-Guaifenesin Er Other (See Comments)    Unknown reaction - Per MAR   Risedronate     Unknown reaction - Per MAR   Ru-Hist D [Brompheniramine-Phenylephrine] Other (See Comments)    Unknown reaction - Per MAR   Sumatriptan  Other (See Comments)   Topiramate Other (See Comments)    Dry eyes   Ace Inhibitors Other (See Comments)    unknown   Actonel [Risedronate Sodium] Other (See Comments)    unknown   Aspirin Other (See Comments)    Unknown reaction   Atacand [Candesartan] Other (See Comments)    Unknown reaction - MAR   Bextra [Valdecoxib] Other (See Comments)    Unknown reaction - Per MAR   Bisoprolol-Hydrochlorothiazide Other (See Comments)    Unknown reaction - Per MAR   Cefadroxil Other (See Comments)    Unknown reaction   Celecoxib Rash   Hydrocodone Other (See Comments)    unknown   Hydrocodone-Acetaminophen Other (See Comments)    unknown   Iodinated Contrast Media Rash and Other (See Comments)    "All over" rash   Meloxicam Other (See Comments)    unknown   Methylprednisolone Sodium Succinate Other (See Comments)    unknown   Nabumetone Other (See Comments)    Unknown reaction   Penicillins Other (See Comments)    unknown  Pseudoephedrine-Guaifenesin Other (See Comments)    unknown   Risedronate Sodium Other (See Comments)    unknown   Rofecoxib Other (See Comments)    Unknown reaction - MAR Other reaction(s): Unknown   Ru-Tuss [Chlorphen-Pse-Atrop-Hyos-Scop] Other (See Comments)    unknown   Sulfonamide Derivatives Other (See Comments)    unknown   Sulphur [Elemental Sulfur] Other (See Comments)    unknown   Telithromycin Other (See Comments)    unknown   Terfenadine Other (See Comments)    Unknown reaction - Per MAR   Trandolapril-Verapamil Hcl Er Other (See Comments)    Headache, difficulty urinating, high blood sugar, fluid retention  Pt is taking Tarka (trandolapril-verapamil) currently, but requests the medication stay in her allergy list   Trandolapril-Verapamil Hcl Er Other (See Comments)    Headache, difficulty urinating, high blood sugar, fluid retention  Pt is taking Tarka (trandolapril-verapamil) currently, but requests the medication stay in her allergy list    Valium [Diazepam] Other (See Comments)    Makes her mean and hyper    HOME MEDICATIONS: Outpatient Medications Prior to Visit  Medication Sig Dispense Refill   acetaminophen (TYLENOL) 500 MG tablet Take 2 tablets (1,000 mg total) by mouth 3 (three) times daily. (Patient taking differently: Take 1,000 mg by mouth every 8 (eight) hours as needed for moderate pain.) 30 tablet 0   ALPRAZolam (XANAX) 0.25 MG tablet Take 0.25 mg by mouth 3 (three) times daily.     benzonatate (TESSALON) 200 MG capsule Take 1 capsule (200 mg total) by mouth 2 (two) times daily as needed for cough. 20 capsule 0   Blood Glucose Monitoring Suppl (PRODIGY VOICE BLOOD GLUCOSE) w/Device KIT Use to check blood sugar 1 time per day. 1 each 2   Cholecalciferol (VITAMIN D-3) 125 MCG (5000 UT) TABS Take 5,000 Units by mouth daily.     cholestyramine (QUESTRAN) 4 g packet Take 4 g by mouth 2 (two) times daily.     cyanocobalamin (,VITAMIN B-12,) 1000 MCG/ML injection Inject 1,000 mcg into the muscle every 21 ( twenty-one) days.     diclofenac Sodium (VOLTAREN) 1 % GEL Apply 2 g topically 4 (four) times daily as needed (for knee and back pain).     diphenhydrAMINE (BENADRYL) 25 MG tablet Take 50 mg by mouth at bedtime.     feeding supplement, GLUCERNA SHAKE, (GLUCERNA SHAKE) LIQD Take 237 mLs by mouth daily as needed (for poor appetite).     glucose blood (GLUCOSE METER TEST) test strip See admin instructions.     Glucose Blood (PRODIGY VOICE BLOOD GLUCOSE VI)      insulin aspart (NOVOLOG) 100 UNIT/ML FlexPen Inject 2-12 Units into the skin See admin instructions. Injects 2-10 units of insulin under the skin 3 times a day per sliding scale: CBG 0-150: 2 units; CBG 151-200: 4 units; CBG 201-250: 6 units; CBG 251-300: 8 units; CBG 301-350: 10 units; CBG 351-400: 12 units; CBG >400: 12 units and notify the MD/NP (Patient taking differently: Inject 2-12 Units into the skin See admin instructions. Inject 2-10 units into the skin 3  times a day per sliding scale: CBG 0-150: 2 units; CBG 151-200: 4 units; CBG 201-250: 6 units; CBG 251-300: 8 units; CBG 301-350: 10 units; CBG 351-400: 12 units; CBG >400: 12 units and notify the MD/NP) 15 mL 0   insulin glargine (LANTUS SOLOSTAR) 100 UNIT/ML Solostar Pen Inject 10 Units into the skin at bedtime. (Patient taking differently: Inject 12 Units into the skin See  admin instructions. Inject 12 units into the skin every afternoon- when eating)     Insulin Pen Needle 32G X 4 MM MISC Used to inject insulin 3x daily 270 each 2   levothyroxine (SYNTHROID) 75 MCG tablet Take 1 tablet (75 mcg total) by mouth daily before breakfast. 30 tablet 0   Polyethyl Glycol-Propyl Glycol (SYSTANE) 0.4-0.3 % SOLN Place 2 drops into both eyes 3 (three) times daily.     trandolapril (MAVIK) 2 MG tablet Take 2 mg by mouth daily.     Verapamil HCl CR 200 MG CP24 Take 200 mg by mouth daily.     phenytoin (DILANTIN) 200 MG ER capsule Take 1 capsule (200 mg total) by mouth at bedtime. 90 capsule 1   primidone (MYSOLINE) 250 MG tablet Take 1 tablet (250 mg total) by mouth at bedtime. (Patient taking differently: Take 250 mg by mouth daily. Taking in the morning) 90 tablet 1   No facility-administered medications prior to visit.      PHYSICAL EXAM  GENERAL EXAM/CONSTITUTIONAL: Vitals:  Vitals:   06/24/21 1053  BP: 134/74  Pulse: 69  Weight: 143 lb (64.9 kg)  Height: 5' 7.5" (1.715 m)   Body mass index is 22.07 kg/m. Wt Readings from Last 3 Encounters:  06/24/21 143 lb (64.9 kg)  04/03/21 143 lb 12.8 oz (65.2 kg)  12/24/20 143 lb 12.8 oz (65.2 kg)   Patient is in no distress; well developed, nourished and groomed; neck is supple  CARDIOVASCULAR: Examination of carotid arteries is normal; no carotid bruits Regular rate and rhythm, no murmurs Examination of peripheral vascular system by observation and palpation is normal  EYES: Ophthalmoscopic exam of optic discs and posterior segments is  normal; no papilledema or hemorrhages No results found.  MUSCULOSKELETAL: Gait, strength, tone, movements noted in Neurologic exam below  NEUROLOGIC: MENTAL STATUS:  MMSE - Mini Mental State Exam 03/28/2018 08/31/2017  Not completed: (No Data) -  Orientation to time 5 5  Orientation to Place 4 5  Registration 3 3  Attention/ Calculation 5 4  Recall 2 2  Language- name 2 objects 2 2  Language- repeat 1 1  Language- follow 3 step command 3 3  Language- read & follow direction 1 1  Write a sentence 1 1  Copy design 1 0  Total score 28 27   awake, alert, oriented to person, place and time recent and remote memory intact normal attention and concentration language fluent, comprehension intact, naming intact fund of knowledge appropriate  CRANIAL NERVE:  2nd, 3rd, 4th, 6th - pupils equal and reactive to light, visual fields full to confrontation, extraocular muscles intact, no nystagmus 5th - facial sensation symmetric 7th - facial strength symmetric 8th - hearing intact 9th - palate elevates symmetrically, uvula midline 11th - shoulder shrug symmetric 12th - tongue protrusion midline  MOTOR:  normal bulk and tone, full strength in the BUE, BLE  SENSORY:  normal and symmetric to light touch  COORDINATION:  finger-nose-finger, fine finger movements normal  REFLEXES:  deep tendon reflexes TRACE and symmetric  GAIT/STATION:  narrow based gait     DIAGNOSTIC DATA (LABS, IMAGING, TESTING) - I reviewed patient records, labs, notes, testing and imaging myself where available.  Lab Results  Component Value Date   WBC 7.5 04/06/2021   HGB 9.9 (L) 04/06/2021   HCT 28.2 (L) 04/06/2021   MCV 89.5 04/06/2021   PLT 406 (H) 04/06/2021      Component Value Date/Time   NA 132 (  L) 04/07/2021 0550   NA 130 (L) 12/24/2020 1143   NA 138 01/04/2013 1212   K 4.1 04/07/2021 0550   K 3.9 01/04/2013 1212   CL 106 04/07/2021 0550   CO2 21 (L) 04/07/2021 0550   CO2 22  01/04/2013 1212   GLUCOSE 166 (H) 04/07/2021 0550   GLUCOSE 285 (H) 01/04/2013 1212   BUN <5 (L) 04/07/2021 0550   BUN 14 12/24/2020 1143   BUN 10.3 01/04/2013 1212   CREATININE 0.56 04/07/2021 0550   CREATININE 0.78 12/01/2013 1659   CREATININE 1.0 01/04/2013 1212   CALCIUM 7.6 (L) 04/07/2021 0550   CALCIUM 9.6 01/04/2013 1212   PROT 5.6 (L) 04/04/2021 0500   PROT 6.3 12/24/2020 1143   PROT 6.4 01/04/2013 1212   ALBUMIN 2.9 (L) 04/04/2021 0500   ALBUMIN 4.2 12/24/2020 1143   ALBUMIN 3.6 01/04/2013 1212   AST 14 (L) 04/04/2021 0500   AST 14 01/04/2013 1212   ALT 15 04/04/2021 0500   ALT 16 01/04/2013 1212   ALKPHOS 112 04/04/2021 0500   ALKPHOS 52 01/04/2013 1212   BILITOT 0.7 04/04/2021 0500   BILITOT <0.2 12/24/2020 1143   BILITOT 1.01 01/04/2013 1212   GFRNONAA >60 04/07/2021 0550   GFRAA >60 01/02/2020 0455   Lab Results  Component Value Date   CHOL 148 01/30/2013   HDL 47.10 01/30/2013   LDLCALC 65 01/30/2013   LDLDIRECT 85.7 09/05/2009   TRIG 179.0 (H) 01/30/2013   CHOLHDL 3 01/30/2013   Lab Results  Component Value Date   HGBA1C 6.6 (H) 04/03/2021   Lab Results  Component Value Date   VITAMINB12 2,750 (H) 06/04/2020   Lab Results  Component Value Date   TSH 3.899 06/08/2020    01/29/21 CT head  1. No acute intracranial process. 2. Stable postsurgical changes and right frontal encephalomalacia.    ASSESSMENT AND PLAN  88 y.o. year old female here with:   Dx:  1. Seizure disorder ([place])     PLAN:  SEIZURE DISORDER (since 2015; prior meningioma, subdural hematoma, s/p surgery; last seizure ~Farve 2019) - continue primidone 244m in AM - continue phenytoin 2043min PM  Essential tremor (brother also has similar tremor) - primidone as above  Meds ordered this encounter  Medications   phenytoin (DILANTIN) 200 MG ER capsule    Sig: Take 1 capsule (200 mg total) by mouth at bedtime.    Dispense:  90 capsule    Refill:  4   primidone  (MYSOLINE) 250 MG tablet    Sig: Take 1 tablet (250 mg total) by mouth daily. Taking in the morning    Dispense:  90 tablet    Refill:  4   Return in about 1 year (around 06/24/2022).  I spent 36 minutes of face-to-face and non-face-to-face time with patient.  This included previsit chart review, lab review, study review, order entry, electronic health record documentation, patient education.    VIPenni BombardMD 06/08/07/[id number] Certified in Neurology, Neurophysiology and Neuroimaging  [facility] [address]SuRadomrWest [place] [id number]

## 2021-06-30 DIAGNOSIS — Z20822 Contact with and (suspected) exposure to covid-19: Secondary | ICD-10-CM | POA: Diagnosis not present

## 2021-07-09 ENCOUNTER — Telehealth: Payer: Self-pay | Admitting: *Deleted

## 2021-07-09 MED ORDER — PHENYTOIN SODIUM EXTENDED 200 MG PO CAPS: 200.0000 mg | ORAL_CAPSULE | Freq: Every day | ORAL | 4 refills | Status: DC

## 2021-07-09 NOTE — Telephone Encounter (Signed)
Received request for Dilantin Rx to go [facility]. Rx change made.  ?

## 2021-07-10 DIAGNOSIS — Z20828 Contact with and (suspected) exposure to other viral communicable diseases: Secondary | ICD-10-CM | POA: Diagnosis not present

## 2021-07-21 ENCOUNTER — Telehealth: Payer: Self-pay | Admitting: Diagnostic Neuroimaging

## 2021-07-21 DIAGNOSIS — L989 Disorder of the skin and subcutaneous tissue, unspecified: Secondary | ICD-10-CM | POA: Diagnosis not present

## 2021-07-21 DIAGNOSIS — Z872 Personal history of diseases of the skin and subcutaneous tissue: Secondary | ICD-10-CM | POA: Diagnosis not present

## 2021-07-21 DIAGNOSIS — Z08 Encounter for follow-up examination after completed treatment for malignant neoplasm: Secondary | ICD-10-CM | POA: Diagnosis not present

## 2021-07-21 DIAGNOSIS — D492 Neoplasm of unspecified behavior of bone, soft tissue, and skin: Secondary | ICD-10-CM | POA: Diagnosis not present

## 2021-07-21 DIAGNOSIS — Z85828 Personal history of other malignant neoplasm of skin: Secondary | ICD-10-CM | POA: Diagnosis not present

## 2021-07-21 NOTE — Telephone Encounter (Signed)
Pt is requesting a refill for ALPRAZolam (XANAX) 1.25 MG tablet. ? ?Pharmacy: [facility]  ?Pt states a new prescription is needed for [facility] ? ?

## 2021-07-21 NOTE — Telephone Encounter (Signed)
PHONE RM CAN RELAY ? ?Contacted pt back, LVM rq call back.  ? ?Doesn't look like our office prescribed this medication to pt. She will need to contact original provider for refills. Looks like [place], [place] from medication history  ?

## 2021-07-22 ENCOUNTER — Encounter: Payer: Self-pay | Admitting: Podiatry

## 2021-07-22 ENCOUNTER — Ambulatory Visit (INDEPENDENT_AMBULATORY_CARE_PROVIDER_SITE_OTHER): Payer: Medicare Other | Admitting: Podiatry

## 2021-07-22 ENCOUNTER — Other Ambulatory Visit: Payer: Self-pay

## 2021-07-22 DIAGNOSIS — B351 Tinea unguium: Secondary | ICD-10-CM

## 2021-07-22 DIAGNOSIS — M79675 Pain in left toe(s): Secondary | ICD-10-CM | POA: Diagnosis not present

## 2021-07-22 DIAGNOSIS — M79674 Pain in right toe(s): Secondary | ICD-10-CM | POA: Diagnosis not present

## 2021-07-22 DIAGNOSIS — M2042 Other hammer toe(s) (acquired), left foot: Secondary | ICD-10-CM

## 2021-07-22 DIAGNOSIS — L84 Corns and callosities: Secondary | ICD-10-CM | POA: Diagnosis not present

## 2021-07-22 DIAGNOSIS — E1142 Type 2 diabetes mellitus with diabetic polyneuropathy: Secondary | ICD-10-CM | POA: Diagnosis not present

## 2021-07-22 DIAGNOSIS — M2041 Other hammer toe(s) (acquired), right foot: Secondary | ICD-10-CM

## 2021-07-22 DIAGNOSIS — E119 Type 2 diabetes mellitus without complications: Secondary | ICD-10-CM

## 2021-07-22 NOTE — Progress Notes (Signed)
ANNUAL DIABETIC FOOT EXAM ? ?Subjective: ?[name] presents today for annual diabetic foot examination.  ? ?Patient had an undesirable visit on her last visit with me. She was placed in a treatment room which was very cold and had an excessive wait. She was hospitalized for pneumonia. ? ?Patient denies any h/o foot wounds. ? ?Patient has been diagnosed with neuropathy and takes no medication. ? ?Patient's blood sugar was 109 mg/dl today. Last HgA1c was in the 6% range. ? ?Patient had a biopsy of two areas performed on her left leg by Dermatology on yesterday and is awaiting results. ? ?Risk factors: diabetic neuropathy, HTN, CAD, hyperlipidemia, foot deformity. ? ?Her plantar calluses are painful on today's visit. ? ?Bartholome Bill, MD is patient's PCP. Last visit was May 26, 2021. ? ?Past Medical History:  ?Diagnosis Date  ? Anemia   ? Anxiety   ? Asthma   ? Breast cancer ([place]) 2014  ? right breast  ? Cancer of right breast ([place]) 12/26/12  ? right breast 12:00 o'clock, DCIS  ? Carotid artery disease ([place])   ? Carpal tunnel syndrome, bilateral   ? Chronic bronchitis ([place])   ? Chronic cough   ? Chronic facial pain   ? Chronic foot pain   ? Colon cancer ([place]) dx'd 11/2019  ? Complication of anesthesia   ? Sore jaw; could not chew or move mouth, prolonged sedation  ? Convulsions/seizures ([place]) 10/16/2014  ? Diabetes mellitus   ? type 2 niddm x 20 years  ? Dyslipidemia   ? Ejection fraction   ? Gait abnormality 12/04/2019  ? GERD (gastroesophageal reflux disease)   ? Hammer toe   ? bilateral  ? History of colonic polyps   ? History of meningioma   ? HTN (hypertension)   ? Hx of radiation therapy 03/07/13- 03/29/13  ? right breast 4250 cGy 17 sessions  ? Hyperlipidemia   ? Hypokalemia   ? Hyponatremia   ? Hypothyroidism   ? IBS (irritable bowel syndrome)   ? Melanoma ([place])   ? Metatarsal bone fracture 2014  ? Multiple drug allergies   ? Nontoxic thyroid nodule   ? Obesity   ? Osteoarthritis   ? Osteoporosis   ?  Palpitations   ? Personal history of radiation therapy 2014  ? Seasonal allergies   ? Skin cancer   ? Syncope   ? Tremor, essential 08/18/2016  ? Vitamin B12 deficiency   ? Vitamin D deficiency   ? ?Patient Active Problem List  ? Diagnosis Date Noted  ? CAP (community acquired pneumonia) 04/03/2021  ? DDD (degenerative disc disease), cervical 12/03/2020  ? Compression fracture of body of thoracic vertebra ([place]) 08/21/2020  ? Dilantin toxicity 08/17/2020  ? Postoperative anemia due to acute blood loss 06/06/2020  ? Leukocytosis   ? Closed right hip fracture ([place]) 06/04/2020  ? Constipation 05/27/2020  ? Dyslipidemia 05/27/2020  ? Enterocolitis due to Clostridium difficile, not specified as recurrent 05/27/2020  ? Pleural effusion on right 04/01/2020  ? Acute respiratory failure with hypoxia ([place]) 04/01/2020  ? Pneumonia of right lung due to infectious organism 04/01/2020  ? Uncontrolled type 2 diabetes mellitus with hyperglycemia, with long-term current use of insulin ([place]) 04/01/2020  ? Abnormal CT of the chest 04/01/2020  ? Subacute pulmonary embolism ([place]) 04/01/2020  ? Acute metabolic encephalopathy 14/70/9295  ? Adenocarcinoma of colon ([place]) 04/01/2020  ? Nausea & vomiting 03/12/2020  ? Cancer of ascending colon pT3pN0 (0/12 LN) s/p lap right  colectomy 03/06/2020 03/09/2020  ? History of multiple allergies 03/09/2020  ? S/P right hemicolectomy 03/06/2020  ? Closed fracture of shaft of tibia 02/12/2020  ? Abnormal feces 12/11/2019  ? Gait abnormality 12/04/2019  ? Impaired left ventricular function 09/05/2019  ? Pain in joint of left shoulder 02/27/2019  ? Hypokalemia   ? Effusion of left knee joint   ? Hyponatremia   ? Left patella fracture 10/26/2018  ? Fracture of patella 10/26/2018  ? Pain in left knee 09/29/2018  ? Acquired hypothyroidism 08/25/2017  ? H/O excision of tumor of brain meninges 08/25/2017  ? Hammer toe 08/25/2017  ? History of breast cancer 08/25/2017  ? Nontoxic thyroid nodule 08/25/2017  ?  Osteopetrosis 08/25/2017  ? Age-related osteoporosis without current pathological fracture 08/25/2017  ? Dysuria 01/19/2017  ? Vitamin D deficiency 11/18/2016  ? Tremor, essential 08/18/2016  ? Closed burst fracture of lumbar vertebra ([place]) 11/25/2015  ? Seizure disorder ([place]) 10/16/2014  ? Chronic low back pain 12/21/2013  ? Cerebral meningioma ([place]) 12/21/2013  ? Brain tumor (benign) ([place]) 10/18/2013  ? Subdural hemorrhage ([place]) 10/09/2013  ? Anxiety 10/09/2013  ? Compression fracture of T12 vertebra ([place]) 10/09/2013  ? Nontoxic multinodular goiter 09/05/2013  ? Syncope 08/27/2013  ? Carotid artery disease ([place]) 08/27/2013  ? Abnormal thyroid ultrasound 08/27/2013  ? Cancer of central portion of female breast ([place]) 12/30/2012  ? Osteoporosis 03/06/2010  ? ASTHMA 03/05/2009  ? Asthma 03/05/2009  ? IRRITABLE BOWEL SYNDROME  diarrhea type 03/21/2008  ? ANEMIA-NOS 12/16/2006  ? GERD 12/16/2006  ? Insulin-requiring or dependent type II diabetes mellitus ([place]) 10/29/2006  ? Mixed hyperlipidemia 10/29/2006  ? Essential hypertension 10/29/2006  ? Allergic rhinitis, cause unspecified 10/29/2006  ? OSTEOARTHRITIS 10/29/2006  ? ?Past Surgical History:  ?Procedure Laterality Date  ? ABDOMINAL HYSTERECTOMY    ? BIOPSY  11/07/2019  ? Procedure: BIOPSY;  Surgeon: Ronald Lobo, MD;  Location: [facility];  Service: Endoscopy;;  ? BRAIN SURGERY    ? BREAST BIOPSY Right 01/24/2013  ? Procedure: RE-EXCICION OF BREAST CANCER, ANTERIOR MARGINS;  Surgeon: Edward Jolly, MD;  Location: [facility];  Service: General;  Laterality: Right;  ? BREAST LUMPECTOMY Right 2014  ? BREAST LUMPECTOMY WITH NEEDLE LOCALIZATION Right 01/17/2013  ? Procedure: BREAST LUMPECTOMY WITH NEEDLE LOCALIZATION;  Surgeon: Edward Jolly, MD;  Location: [facility];  Service: General;  Laterality: Right;  ? BUNIONECTOMY Bilateral   ? CATARACT EXTRACTION W/ INTRAOCULAR LENS IMPLANT Right   ? CHOLECYSTECTOMY    ? COLONOSCOPY    ? COLONOSCOPY WITH PROPOFOL N/A  11/07/2019  ? Procedure: COLONOSCOPY WITH PROPOFOL;  Surgeon: Ronald Lobo, MD;  Location: [facility];  Service: Endoscopy;  Laterality: N/A;  ? CRANIOTOMY Right 10/18/2013  ? Procedure: CRANIOTOMY TUMOR EXCISION;  Surgeon: Floyce Stakes, MD;  Location: [facility];  Service: Neurosurgery;  Laterality: Right;  ? ESOPHAGOGASTRODUODENOSCOPY (EGD) WITH PROPOFOL N/A 11/07/2019  ? Procedure: ESOPHAGOGASTRODUODENOSCOPY (EGD) WITH PROPOFOL;  Surgeon: Ronald Lobo, MD;  Location: [facility];  Service: Endoscopy;  Laterality: N/A;  ? EYE SURGERY    ? HERNIA REPAIR    ? IR THORACENTESIS ASP PLEURAL SPACE W/IMG GUIDE  04/01/2020  ? KNEE ARTHROSCOPY Bilateral   ? LAPAROSCOPIC RIGHT HEMI COLECTOMY Right 03/06/2020  ? Procedure: LAPAROSCOPIC RIGHT HEMI COLECTOMY WITH TAP BLOCK AND LYSIS OF ADHESIONS;  Surgeon: Ileana Roup, MD;  Location: [facility];  Service: General;  Laterality: Right;  ? POLYPECTOMY    ? small adenomatous  ?  TOTAL HIP ARTHROPLASTY Right 06/05/2020  ? Procedure: ARTHROPLASTY  ANTERIOR APPROACH. RIGHT HIP;  Surgeon: Rod Can, MD;  Location: [facility];  Service: Orthopedics;  Laterality: Right;  ? ULNAR TUNNEL RELEASE    ? ?Current Outpatient Medications on File Prior to Visit  ?Medication Sig Dispense Refill  ? ALPRAZolam (XANAX) 0.25 MG tablet Take by mouth.    ? cholestyramine (QUESTRAN) 4 g packet Take by mouth.    ? furosemide (LASIX) 20 MG tablet Take by mouth.    ? acetaminophen (TYLENOL) 500 MG tablet Take 2 tablets (1,000 mg total) by mouth 3 (three) times daily. (Patient taking differently: Take 1,000 mg by mouth every 8 (eight) hours as needed for moderate pain.) 30 tablet 0  ? ALPRAZolam (XANAX) 0.25 MG tablet Take 0.25 mg by mouth 3 (three) times daily.    ? benzonatate (TESSALON) 200 MG capsule Take 1 capsule (200 mg total) by mouth 2 (two) times daily as needed for cough. 20 capsule 0  ? Blood Glucose Monitoring Suppl (PRODIGY VOICE BLOOD GLUCOSE) w/Device KIT Use to check blood  sugar 1 time per day. 1 each 2  ? cefdinir (OMNICEF) 300 MG capsule Take 300 mg by mouth 2 (two) times daily.    ? Cholecalciferol (VITAMIN D-3) 125 MCG (5000 UT) TABS Take 5,000 Units by mouth daily.    ? Cholecalcif

## 2021-07-29 DIAGNOSIS — H5340 Unspecified visual field defects: Secondary | ICD-10-CM | POA: Diagnosis not present

## 2021-07-29 DIAGNOSIS — H35371 Puckering of macula, right eye: Secondary | ICD-10-CM | POA: Diagnosis not present

## 2021-08-07 DIAGNOSIS — Z20822 Contact with and (suspected) exposure to covid-19: Secondary | ICD-10-CM | POA: Diagnosis not present

## 2021-08-14 DIAGNOSIS — Z20828 Contact with and (suspected) exposure to other viral communicable diseases: Secondary | ICD-10-CM | POA: Diagnosis not present

## 2021-08-18 DIAGNOSIS — M170 Bilateral primary osteoarthritis of knee: Secondary | ICD-10-CM | POA: Diagnosis not present

## 2021-08-18 DIAGNOSIS — Z20822 Contact with and (suspected) exposure to covid-19: Secondary | ICD-10-CM | POA: Diagnosis not present

## 2021-08-19 DIAGNOSIS — R200 Anesthesia of skin: Secondary | ICD-10-CM | POA: Diagnosis not present

## 2021-08-19 DIAGNOSIS — M5030 Other cervical disc degeneration, unspecified cervical region: Secondary | ICD-10-CM | POA: Diagnosis not present

## 2021-08-25 DIAGNOSIS — M170 Bilateral primary osteoarthritis of knee: Secondary | ICD-10-CM | POA: Diagnosis not present

## 2021-08-26 ENCOUNTER — Ambulatory Visit: Payer: Medicare Other | Admitting: Podiatry

## 2021-09-06 DIAGNOSIS — Z20822 Contact with and (suspected) exposure to covid-19: Secondary | ICD-10-CM | POA: Diagnosis not present

## 2021-09-08 DIAGNOSIS — G6181 Chronic inflammatory demyelinating polyneuritis: Secondary | ICD-10-CM | POA: Diagnosis not present

## 2021-09-08 DIAGNOSIS — G5602 Carpal tunnel syndrome, left upper limb: Secondary | ICD-10-CM | POA: Diagnosis not present

## 2021-09-08 DIAGNOSIS — Z20822 Contact with and (suspected) exposure to covid-19: Secondary | ICD-10-CM | POA: Diagnosis not present

## 2021-09-19 ENCOUNTER — Other Ambulatory Visit: Payer: Self-pay | Admitting: Family Medicine

## 2021-09-19 DIAGNOSIS — Z1231 Encounter for screening mammogram for malignant neoplasm of breast: Secondary | ICD-10-CM

## 2021-09-22 DIAGNOSIS — F419 Anxiety disorder, unspecified: Secondary | ICD-10-CM | POA: Diagnosis not present

## 2021-09-22 DIAGNOSIS — M1712 Unilateral primary osteoarthritis, left knee: Secondary | ICD-10-CM | POA: Diagnosis not present

## 2021-09-22 DIAGNOSIS — G5602 Carpal tunnel syndrome, left upper limb: Secondary | ICD-10-CM | POA: Insufficient documentation

## 2021-09-22 DIAGNOSIS — K219 Gastro-esophageal reflux disease without esophagitis: Secondary | ICD-10-CM | POA: Diagnosis not present

## 2021-09-23 ENCOUNTER — Ambulatory Visit: Payer: Medicare Other | Admitting: Podiatry

## 2021-09-23 DIAGNOSIS — G5602 Carpal tunnel syndrome, left upper limb: Secondary | ICD-10-CM | POA: Diagnosis not present

## 2021-10-06 ENCOUNTER — Ambulatory Visit (INDEPENDENT_AMBULATORY_CARE_PROVIDER_SITE_OTHER): Payer: Medicare Other | Admitting: Podiatry

## 2021-10-06 ENCOUNTER — Encounter: Payer: Self-pay | Admitting: Podiatry

## 2021-10-06 DIAGNOSIS — M79675 Pain in left toe(s): Secondary | ICD-10-CM

## 2021-10-06 DIAGNOSIS — R1313 Dysphagia, pharyngeal phase: Secondary | ICD-10-CM | POA: Insufficient documentation

## 2021-10-06 DIAGNOSIS — E1142 Type 2 diabetes mellitus with diabetic polyneuropathy: Secondary | ICD-10-CM

## 2021-10-06 DIAGNOSIS — B351 Tinea unguium: Secondary | ICD-10-CM

## 2021-10-06 DIAGNOSIS — M8580 Other specified disorders of bone density and structure, unspecified site: Secondary | ICD-10-CM | POA: Insufficient documentation

## 2021-10-06 DIAGNOSIS — M79674 Pain in right toe(s): Secondary | ICD-10-CM

## 2021-10-06 DIAGNOSIS — K909 Intestinal malabsorption, unspecified: Secondary | ICD-10-CM | POA: Insufficient documentation

## 2021-10-06 DIAGNOSIS — L84 Corns and callosities: Secondary | ICD-10-CM

## 2021-10-06 DIAGNOSIS — Z85038 Personal history of other malignant neoplasm of large intestine: Secondary | ICD-10-CM | POA: Insufficient documentation

## 2021-10-06 DIAGNOSIS — Z800 Family history of malignant neoplasm of digestive organs: Secondary | ICD-10-CM | POA: Insufficient documentation

## 2021-10-06 DIAGNOSIS — Z8781 Personal history of (healed) traumatic fracture: Secondary | ICD-10-CM | POA: Insufficient documentation

## 2021-10-06 NOTE — Progress Notes (Signed)
Subjective:  Patient ID: [name], female    DOB: [birth year],  MRN: [id number]  Wallace S Yankovich presents to clinic today for at risk foot care with history of diabetic neuropathy and preulcerative lesion(s) b/l lower extremities and painful mycotic toenails that limit ambulation. Painful toenails interfere with ambulation. Aggravating factors include wearing enclosed shoe gear. Pain is relieved with periodic professional debridement. Painful porokeratotic lesions are aggravated when weightbearing with and without shoegear. Pain is relieved with periodic professional debridement.  Most symptomatic is her left foot lesion today. She is accompanied by her niece on today's visit.  Patient states blood glucose was 168 mg/dl today.  Last known HgA1c was 7.1%.  New problem(s): None.   PCP is Bartholome Bill, MD , and last visit was last week.  Allergies  Allergen Reactions   Bystolic [Nebivolol Hcl] Other (See Comments)    "extreme weakness, heaviness in legs & arms, swelling in legs/arms/face, swollen abdomen, pain in bladder, feet pain, soreness in chest"   Carbamazepine Other (See Comments)    Blood poisoning   Cholestyramine Itching, Nausea And Vomiting, Rash and Other (See Comments)    "itching rash on stomach, bloated, nausea, vomiting, sleeplessness, extreme pain in arms"  Patient is taking this in 2022.   Morphine Other (See Comments)    Feels morbid, weak, still in pain   Niaspan [Niacin Er] Palpitations and Other (See Comments)    "fast heart beat, high blood pressure"   Norvasc [Amlodipine Besylate] Other (See Comments)    "extreme fluid retention/pain)   Optivar [Azelastine Hcl] Photosensitivity   Repaglinide Hives   Sular [Nisoldipine Er] Other (See Comments)    "severe headaches, swelling eyes, hands, feet, shortness of breath, weak, flushed face, brain boiling, fluid retention, high blood sugar, nervous, heart fast beating"   Telmisartan Other (See Comments)    "headache,  difficulty urinating, high blood sugar, fluid retention"   Amoxicillin-Pot Clavulanate Rash    Tolerates Unasyn (>10 doses)   Cefdinir Swelling    Vaginal irritation, breathing,    Ciprofloxacin Hcl Hives   Clonidine Other (See Comments)    Dry mouth, fluid retention   Clonidine Hydrochloride Other (See Comments)    Dry mouth, fluid retention   Codeine Nausea And Vomiting   Ezetimibe Other (See Comments)    Made weak   Hydroxychloroquine Other (See Comments)    Low platlets   Naproxen Other (See Comments)    Shrinks bladder   Sulfa Antibiotics Rash   Ziac [Bisoprolol-Hydrochlorothiazide] Other (See Comments)    "stopped urination"   Amlodipine Besy-Benazepril Hcl Cough   Azelastine Other (See Comments)    Unknown reaction - Per MAR   Elavil [Amitriptyline] Other (See Comments)    Gave Pt nightmares   Empagliflozin Other (See Comments)    "Caused yeast infection, slowed my urine"   Hydralazine Other (See Comments)    "does not reduce high blood pressure, pain in arm, high pressure, felt like I was on verge of heart attack, really weak"   Iodine I 131 Tositumomab Other (See Comments)    Unknown reaction - Per MAR   Kenalog [Triamcinolone] Diarrhea    Per MAR   Keppra [Levetiracetam] Other (See Comments)    Shaking   Lamotrigine Itching and Other (See Comments)   Pregabalin Swelling and Other (See Comments)    Weight gain   Pseudoephedrine Other (See Comments)    Unknown reaction   Pseudoephedrine-Guaifenesin Er Other (See Comments)    Unknown reaction -  Per MAR   Risedronate     Unknown reaction - Per MAR   Ru-Hist D [Brompheniramine-Phenylephrine] Other (See Comments)    Unknown reaction - Per MAR   Sumatriptan Other (See Comments)   Topiramate Other (See Comments)    Dry eyes   Ace Inhibitors Other (See Comments)    unknown   Actonel [Risedronate Sodium] Other (See Comments)    unknown   Aspirin Other (See Comments)    Unknown reaction   Atacand [Candesartan]  Other (See Comments)    Unknown reaction - MAR   Bextra [Valdecoxib] Other (See Comments)    Unknown reaction - Per MAR   Bisoprolol-Hydrochlorothiazide Other (See Comments)    Unknown reaction - Per MAR   Cefadroxil Other (See Comments)    Unknown reaction   Celecoxib Rash   Hydrocodone Other (See Comments)    unknown   Hydrocodone-Acetaminophen Other (See Comments)    unknown   Iodinated Contrast Media Rash and Other (See Comments)    "All over" rash   Meloxicam Other (See Comments)    unknown   Methylprednisolone Sodium Succinate Other (See Comments)    unknown   Nabumetone Other (See Comments)    Unknown reaction   Penicillins Other (See Comments)    unknown   Pseudoephedrine-Guaifenesin Other (See Comments)    unknown   Risedronate Sodium Other (See Comments)    unknown   Rofecoxib Other (See Comments)    Unknown reaction - MAR Other reaction(s): Unknown   Ru-Tuss [Chlorphen-Pse-Atrop-Hyos-Scop] Other (See Comments)    unknown   Sulfonamide Derivatives Other (See Comments)    unknown   Sulphur [Elemental Sulfur] Other (See Comments)    unknown   Telithromycin Other (See Comments)    unknown   Terfenadine Other (See Comments)    Unknown reaction - Per MAR   Trandolapril-Verapamil Hcl Er Other (See Comments)    Headache, difficulty urinating, high blood sugar, fluid retention  Pt is taking Tarka (trandolapril-verapamil) currently, but requests the medication stay in her allergy list   Trandolapril-Verapamil Hcl Er Other (See Comments)    Headache, difficulty urinating, high blood sugar, fluid retention  Pt is taking Tarka (trandolapril-verapamil) currently, but requests the medication stay in her allergy list   Valium [Diazepam] Other (See Comments)    Makes her mean and hyper    Review of Systems: Negative except as noted in the HPI.  Objective: No changes noted in today's physical examination. There were no vitals filed for this visit.  Charnese Federici Faller is a  pleasant 88 y.o. female in NAD. AAO X 3.  Vascular Examination: CFT immediate b/l LE. Faintly palpable DP/PT pulses b/l LE. Digital hair absent b/l. Skin temperature gradient WNL b/l. No pain with calf compression b/l. No edema noted b/l. No cyanosis or clubbing noted b/l LE.  Dermatological Examination: No open wounds b/l LE. No interdigital macerations noted b/l LE. Toenails 1-5 bilaterally elongated, discolored, dystrophic, thickened, and crumbly with subungual debris and tenderness to dorsal palpation. Preulcerative lesion noted submet head 1 b/l. There is visible subdermal hemorrhage. There is no surrounding erythema, no edema, no drainage, no odor, no fluctuance.  Neurological Examination: Protective sensation diminished with 10g monofilament b/l. Vibratory sensation intact b/l.  Musculoskeletal Examination: Muscle strength 5/5 to all LE muscle groups of b/l lower extremities. Hammertoe deformity noted 1-5 b/l. Wearing diabetic shoes today. Utilizes rollator for ambulation assistance.     Latest Ref Rng & Units 04/03/2021   11:05 AM  Hemoglobin A1C  Hemoglobin-A1c 4.8 - 5.6 % 6.6     Assessment/Plan: 1. Pain due to onychomycosis of toenails of both feet   2. Pre-ulcerative calluses   3. Diabetic peripheral neuropathy associated with type 2 diabetes mellitus ([place])     -Patient was evaluated and treated. All patient's and/or POA's questions/concerns answered on today's visit. -Examined patient. -Continue diabetic foot care principles: inspect feet daily, monitor glucose as recommended by PCP and/or Endocrinologist, and follow prescribed diet per PCP, Endocrinologist and/or dietician. -Patient to continue soft, supportive shoe gear daily. -Toenails 1-5 b/l were debrided in length and girth with sterile nail nippers and dremel without iatrogenic bleeding.  -Porokeratotic lesion(s) submet head 1 b/l pared and enucleated with sterile scalpel blade without incident. Total number of  lesions debrided=2. -Patient/POA to call should there be question/concern in the interim.   Return in about 9 weeks (around 12/08/2021).  Marzetta Board, DPM

## 2021-10-14 ENCOUNTER — Ambulatory Visit
Admission: RE | Admit: 2021-10-14 | Discharge: 2021-10-14 | Disposition: A | Discharge status: Home / Self Care | Payer: Medicare Other | Source: Ambulatory Visit | Attending: Family Medicine | Admitting: Family Medicine

## 2021-10-14 DIAGNOSIS — Z1231 Encounter for screening mammogram for malignant neoplasm of breast: Secondary | ICD-10-CM

## 2021-10-21 DIAGNOSIS — G5602 Carpal tunnel syndrome, left upper limb: Secondary | ICD-10-CM | POA: Diagnosis not present

## 2021-10-27 DIAGNOSIS — Z85828 Personal history of other malignant neoplasm of skin: Secondary | ICD-10-CM | POA: Diagnosis not present

## 2021-10-27 DIAGNOSIS — Z08 Encounter for follow-up examination after completed treatment for malignant neoplasm: Secondary | ICD-10-CM | POA: Diagnosis not present

## 2021-11-25 ENCOUNTER — Encounter: Payer: Self-pay | Admitting: Podiatry

## 2021-11-25 ENCOUNTER — Ambulatory Visit (INDEPENDENT_AMBULATORY_CARE_PROVIDER_SITE_OTHER): Payer: Medicare Other | Admitting: Podiatry

## 2021-11-25 DIAGNOSIS — M79674 Pain in right toe(s): Secondary | ICD-10-CM | POA: Diagnosis not present

## 2021-11-25 DIAGNOSIS — B351 Tinea unguium: Secondary | ICD-10-CM | POA: Diagnosis not present

## 2021-11-25 DIAGNOSIS — E1142 Type 2 diabetes mellitus with diabetic polyneuropathy: Secondary | ICD-10-CM

## 2021-11-25 DIAGNOSIS — M79675 Pain in left toe(s): Secondary | ICD-10-CM

## 2021-11-25 DIAGNOSIS — L84 Corns and callosities: Secondary | ICD-10-CM

## 2021-11-30 NOTE — Progress Notes (Signed)
Subjective:  Patient ID: [name], female    DOB: June 19, 1929,  MRN: [id number]  [name] presents to clinic today for at risk foot care with history of diabetic neuropathy and preulcerative lesion(s) submet head 1 b/l and painful mycotic toenails that limit ambulation. Painful toenails interfere with ambulation. Aggravating factors include wearing enclosed shoe gear. Pain is relieved with periodic professional debridement. Painful porokeratotic lesions are aggravated when weightbearing with and without shoegear. Pain is relieved with periodic professional debridement.  Patient states blood glucose was 161 mg/dl today. Last known HgA1c was unknown.    New problem(s): None.   PCP is Bartholome Bill, MD , and last visit was  Ulrey 22, 2023  Allergies  Allergen Reactions   Bystolic [Nebivolol Hcl] Other (See Comments)    "extreme weakness, heaviness in legs & arms, swelling in legs/arms/face, swollen abdomen, pain in bladder, feet pain, soreness in chest"   Carbamazepine Other (See Comments)    Blood poisoning   Cholestyramine Itching, Nausea And Vomiting, Rash and Other (See Comments)    "itching rash on stomach, bloated, nausea, vomiting, sleeplessness, extreme pain in arms"  Patient is taking this in 2022.   Morphine Other (See Comments)    Feels morbid, weak, still in pain   Niacin Palpitations    Fast heart beat Other reaction(s): Unknown   Niaspan [Niacin Er] Palpitations and Other (See Comments)    "fast heart beat, high blood pressure"   Norvasc [Amlodipine Besylate] Other (See Comments)    "extreme fluid retention/pain)   Optivar [Azelastine Hcl] Photosensitivity   Repaglinide Hives   Sular [Nisoldipine Er] Other (See Comments)    "severe headaches, swelling eyes, hands, feet, shortness of breath, weak, flushed face, brain boiling, fluid retention, high blood sugar, nervous, heart fast beating"   Telmisartan Other (See Comments)    "headache, difficulty urinating, high  blood sugar, fluid retention"   Amoxicillin-Pot Clavulanate Rash    Tolerates Unasyn (>10 doses)   Cefdinir Swelling    Vaginal irritation, breathing,    Ciprofloxacin Hcl Hives   Clonidine Other (See Comments)    Dry mouth, fluid retention   Clonidine Hydrochloride Other (See Comments)    Dry mouth, fluid retention   Codeine Nausea And Vomiting   Ezetimibe Other (See Comments)    Made weak   Hydroxychloroquine Other (See Comments)    Low platlets   Naproxen Other (See Comments)    Shrinks bladder   Sulfa Antibiotics Rash   Ziac [Bisoprolol-Hydrochlorothiazide] Other (See Comments)    "stopped urination"   Amlodipine Besy-Benazepril Hcl Cough   Azelastine Other (See Comments)    Unknown reaction - Per MAR   Elavil [Amitriptyline] Other (See Comments)    Gave Pt nightmares   Empagliflozin Other (See Comments)    "Caused yeast infection, slowed my urine"   Hydralazine Other (See Comments)    "does not reduce high blood pressure, pain in arm, high pressure, felt like I was on verge of heart attack, really weak"   Iodine I 131 Tositumomab Other (See Comments)    Unknown reaction - Per MAR   Kenalog [Triamcinolone] Diarrhea    Per MAR   Keppra [Levetiracetam] Other (See Comments)    Shaking   Lamotrigine Itching and Other (See Comments)   Misc. Sulfonamide Containing Compounds    Pregabalin Swelling and Other (See Comments)    Weight gain   Pseudoephedrine Other (See Comments)    Unknown reaction   Pseudoephedrine-Guaifenesin Er Other (See Comments)  Unknown reaction - Per MAR   Risedronate     Unknown reaction - Per MAR   Ru-Hist D [Brompheniramine-Phenylephrine] Other (See Comments)    Unknown reaction - Per MAR   Sumatriptan Other (See Comments)   Topiramate Other (See Comments)    Dry eyes   Ace Inhibitors Other (See Comments)    unknown   Actonel [Risedronate Sodium] Other (See Comments)    unknown   Aspirin Other (See Comments)    Unknown reaction   Atacand  [Candesartan] Other (See Comments)    Unknown reaction - MAR   Bextra [Valdecoxib] Other (See Comments)    Unknown reaction - Per MAR   Bisoprolol-Hydrochlorothiazide Other (See Comments)    Unknown reaction - Per MAR   Cefadroxil Other (See Comments)    Unknown reaction   Celecoxib Rash   Hydrocodone Other (See Comments)    unknown   Hydrocodone-Acetaminophen Other (See Comments)    unknown   Iodinated Contrast Media Rash and Other (See Comments)    "All over" rash   Meloxicam Other (See Comments)    unknown   Methylprednisolone Sodium Succinate Other (See Comments)    unknown   Nabumetone Other (See Comments)    Unknown reaction   Penicillins Other (See Comments)    unknown   Pseudoephedrine-Guaifenesin Other (See Comments)    unknown   Risedronate Sodium Other (See Comments)    unknown   Rofecoxib Other (See Comments)    Unknown reaction - MAR Other reaction(s): Unknown   Ru-Tuss [Chlorphen-Pse-Atrop-Hyos-Scop] Other (See Comments)    unknown   Sulfonamide Derivatives Other (See Comments)    unknown   Sulphur [Elemental Sulfur] Other (See Comments)    unknown   Telithromycin Other (See Comments)    unknown   Terfenadine Other (See Comments)    Unknown reaction - Per MAR   Trandolapril-Verapamil Hcl Er Other (See Comments)    Headache, difficulty urinating, high blood sugar, fluid retention  Pt is taking Tarka (trandolapril-verapamil) currently, but requests the medication stay in her allergy list   Trandolapril-Verapamil Hcl Er Other (See Comments)    Headache, difficulty urinating, high blood sugar, fluid retention  Pt is taking Tarka (trandolapril-verapamil) currently, but requests the medication stay in her allergy list   Valium [Diazepam] Other (See Comments)    Makes her mean and hyper    Review of Systems: Negative except as noted in the HPI.  Objective: No changes noted in today's physical examination.  [name] is a pleasant 88 y.o. female in NAD.  AAO X 3.  Vascular Examination: CFT immediate b/l LE. Faintly palpable DP/PT pulses b/l LE. Digital hair absent b/l. Skin temperature gradient WNL b/l. No pain with calf compression b/l. No edema noted b/l. No cyanosis or clubbing noted b/l LE.  Dermatological Examination: No open wounds b/l LE. No interdigital macerations noted b/l LE. Toenails 1-5 bilaterally elongated, discolored, dystrophic, thickened, and crumbly with subungual debris and tenderness to dorsal palpation. Preulcerative lesion noted submet head 1 b/l. There is visible subdermal hemorrhage. There is no surrounding erythema, no edema, no drainage, no odor, no fluctuance.  Neurological Examination: Protective sensation diminished with 10g monofilament b/l. Vibratory sensation intact b/l.  Musculoskeletal Examination: Muscle strength 5/5 to all LE muscle groups of b/l lower extremities. Hammertoe deformity noted 1-5 b/l. Wearing diabetic shoes today. Utilizes rollator for ambulation assistance.    Latest Ref Rng & Units 04/03/2021   11:05 AM  Hemoglobin A1C  Hemoglobin-A1c 4.8 - 5.6 %  6.6    Assessment/Plan: 1. Pain due to onychomycosis of toenails of both feet   2. Pre-ulcerative calluses   3. Diabetic peripheral neuropathy associated with type 2 diabetes mellitus ([place])   -Patient was evaluated and treated. All patient's and/or POA's questions/concerns answered on today's visit. -Medicare ABN signed. Patient consents for services of paring of calluses and toenails due to frequency  today. Copy has been placed in patient's chart. -Mycotic toenails 1-5 bilaterally were debrided in length and girth with sterile nail nippers and dremel without incident. -Preulcerative lesion pared submet head 1 b/l. Total number pared=2. -Patient/POA to call should there be question/concern in the interim.   Return in about 9 weeks (around 01/27/2022).  Marzetta Board, DPM

## 2021-12-08 DIAGNOSIS — H10413 Chronic giant papillary conjunctivitis, bilateral: Secondary | ICD-10-CM | POA: Diagnosis not present

## 2021-12-15 DIAGNOSIS — M5030 Other cervical disc degeneration, unspecified cervical region: Secondary | ICD-10-CM | POA: Diagnosis not present

## 2021-12-21 ENCOUNTER — Emergency Department (HOSPITAL_COMMUNITY): Payer: Medicare Other

## 2021-12-21 ENCOUNTER — Observation Stay (HOSPITAL_COMMUNITY)
Admission: EM | Admit: 2021-12-21 | Discharge: 2021-12-22 | Disposition: A | Discharge status: Home Health | Payer: Medicare Other | Attending: Internal Medicine | Admitting: Internal Medicine

## 2021-12-21 ENCOUNTER — Encounter (HOSPITAL_COMMUNITY): Payer: Self-pay

## 2021-12-21 DIAGNOSIS — Z794 Long term (current) use of insulin: Secondary | ICD-10-CM | POA: Diagnosis not present

## 2021-12-21 DIAGNOSIS — R55 Syncope and collapse: Secondary | ICD-10-CM | POA: Diagnosis not present

## 2021-12-21 DIAGNOSIS — Z79899 Other long term (current) drug therapy: Secondary | ICD-10-CM | POA: Diagnosis not present

## 2021-12-21 DIAGNOSIS — G40909 Epilepsy, unspecified, not intractable, without status epilepticus: Secondary | ICD-10-CM

## 2021-12-21 DIAGNOSIS — R410 Disorientation, unspecified: Secondary | ICD-10-CM | POA: Diagnosis not present

## 2021-12-21 DIAGNOSIS — I6381 Other cerebral infarction due to occlusion or stenosis of small artery: Secondary | ICD-10-CM | POA: Diagnosis not present

## 2021-12-21 DIAGNOSIS — E1165 Type 2 diabetes mellitus with hyperglycemia: Secondary | ICD-10-CM

## 2021-12-21 DIAGNOSIS — I10 Essential (primary) hypertension: Secondary | ICD-10-CM | POA: Insufficient documentation

## 2021-12-21 DIAGNOSIS — E039 Hypothyroidism, unspecified: Secondary | ICD-10-CM | POA: Diagnosis not present

## 2021-12-21 DIAGNOSIS — K219 Gastro-esophageal reflux disease without esophagitis: Secondary | ICD-10-CM | POA: Insufficient documentation

## 2021-12-21 DIAGNOSIS — Z85038 Personal history of other malignant neoplasm of large intestine: Secondary | ICD-10-CM | POA: Insufficient documentation

## 2021-12-21 DIAGNOSIS — R4182 Altered mental status, unspecified: Secondary | ICD-10-CM | POA: Diagnosis not present

## 2021-12-21 DIAGNOSIS — R404 Transient alteration of awareness: Secondary | ICD-10-CM | POA: Diagnosis not present

## 2021-12-21 DIAGNOSIS — Z86000 Personal history of in-situ neoplasm of breast: Secondary | ICD-10-CM | POA: Insufficient documentation

## 2021-12-21 DIAGNOSIS — Z96641 Presence of right artificial hip joint: Secondary | ICD-10-CM | POA: Insufficient documentation

## 2021-12-21 DIAGNOSIS — R4020 Unspecified coma: Secondary | ICD-10-CM

## 2021-12-21 DIAGNOSIS — E871 Hypo-osmolality and hyponatremia: Secondary | ICD-10-CM | POA: Diagnosis not present

## 2021-12-21 DIAGNOSIS — S060X9A Concussion with loss of consciousness of unspecified duration, initial encounter: Secondary | ICD-10-CM | POA: Diagnosis not present

## 2021-12-21 LAB — URINALYSIS, ROUTINE W REFLEX MICROSCOPIC
Bilirubin Urine: NEGATIVE
Glucose, UA: NEGATIVE mg/dL
Hgb urine dipstick: NEGATIVE
Ketones, ur: NEGATIVE mg/dL
Nitrite: NEGATIVE
Protein, ur: NEGATIVE mg/dL
Specific Gravity, Urine: <1.005 — ABNORMAL LOW (ref 1.005–1.030)
pH: 6.5 (ref 5.0–8.0)

## 2021-12-21 LAB — I-STAT VENOUS BLOOD GAS, ED
Acid-base deficit: 2 mmol/L (ref 0.0–2.0)
Bicarbonate: 22.1 mmol/L (ref 20.0–28.0)
Calcium, Ion: 1.05 mmol/L — ABNORMAL LOW (ref 1.15–1.40)
HCT: 34 % — ABNORMAL LOW (ref 36.0–46.0)
Hemoglobin: 11.6 g/dL — ABNORMAL LOW (ref 12.0–15.0)
O2 Saturation: 82 %
Potassium: 4.4 mmol/L (ref 3.5–5.1)
Sodium: 128 mmol/L — ABNORMAL LOW (ref 135–145)
TCO2: 23 mmol/L (ref 22–32)
pCO2, Ven: 34.8 mmHg — ABNORMAL LOW (ref 44–60)
pH, Ven: 7.411 (ref 7.25–7.43)
pO2, Ven: 45 mmHg (ref 32–45)

## 2021-12-21 LAB — COMPREHENSIVE METABOLIC PANEL
ALT: 15 U/L (ref 0–44)
AST: 16 U/L (ref 15–41)
Albumin: 3.4 g/dL — ABNORMAL LOW (ref 3.5–5.0)
Alkaline Phosphatase: 88 U/L (ref 38–126)
Anion gap: 8 (ref 5–15)
BUN: 12 mg/dL (ref 8–23)
CO2: 24 mmol/L (ref 22–32)
Calcium: 8.5 mg/dL — ABNORMAL LOW (ref 8.9–10.3)
Chloride: 97 mmol/L — ABNORMAL LOW (ref 98–111)
Creatinine, Ser: 0.78 mg/dL (ref 0.44–1.00)
GFR, Estimated: >60 mL/min (ref >60)
Glucose, Bld: 155 mg/dL — ABNORMAL HIGH (ref 70–99)
Potassium: 4.3 mmol/L (ref 3.5–5.1)
Sodium: 129 mmol/L — ABNORMAL LOW (ref 135–145)
Total Bilirubin: 0.7 mg/dL (ref 0.3–1.2)
Total Protein: 5.9 g/dL — ABNORMAL LOW (ref 6.5–8.1)

## 2021-12-21 LAB — AMMONIA: Ammonia: 23 umol/L (ref 9–35)

## 2021-12-21 LAB — URINALYSIS, MICROSCOPIC (REFLEX)
Bacteria, UA: NONE SEEN
Squamous Epithelial / HPF: NONE SEEN (ref 0–5)

## 2021-12-21 LAB — CBC WITH DIFFERENTIAL/PLATELET
Abs Immature Granulocytes: 0.04 10*3/uL (ref 0.00–0.07)
Basophils Absolute: 0 10*3/uL (ref 0.0–0.1)
Basophils Relative: 0 %
Eosinophils Absolute: 0.1 10*3/uL (ref 0.0–0.5)
Eosinophils Relative: 1 %
HCT: 34.6 % — ABNORMAL LOW (ref 36.0–46.0)
Hemoglobin: 11.4 g/dL — ABNORMAL LOW (ref 12.0–15.0)
Immature Granulocytes: 1 %
Lymphocytes Relative: 22 %
Lymphs Abs: 1.7 10*3/uL (ref 0.7–4.0)
MCH: 30.9 pg (ref 26.0–34.0)
MCHC: 32.9 g/dL (ref 30.0–36.0)
MCV: 93.8 fL (ref 80.0–100.0)
Monocytes Absolute: 0.6 10*3/uL (ref 0.1–1.0)
Monocytes Relative: 8 %
Neutro Abs: 5.3 10*3/uL (ref 1.7–7.7)
Neutrophils Relative %: 68 %
Platelets: 331 10*3/uL (ref 150–400)
RBC: 3.69 MIL/uL — ABNORMAL LOW (ref 3.87–5.11)
RDW: 14.5 % (ref 11.5–15.5)
WBC: 7.7 10*3/uL (ref 4.0–10.5)
nRBC: 0 % (ref 0.0–0.2)

## 2021-12-21 LAB — PHOSPHORUS: Phosphorus: 3.2 mg/dL (ref 2.5–4.6)

## 2021-12-21 LAB — MAGNESIUM: Magnesium: 1.9 mg/dL (ref 1.7–2.4)

## 2021-12-21 LAB — CBG MONITORING, ED
Glucose-Capillary: 148 mg/dL — ABNORMAL HIGH (ref 70–99)
Glucose-Capillary: 123 mg/dL — ABNORMAL HIGH (ref 70–99)
Glucose-Capillary: 234 mg/dL — ABNORMAL HIGH (ref 70–99)

## 2021-12-21 LAB — TSH: TSH: 3.062 u[IU]/mL (ref 0.350–4.500)

## 2021-12-21 LAB — PHENYTOIN LEVEL, TOTAL: Phenytoin Lvl: <2.5 ug/mL — ABNORMAL LOW (ref 10.0–20.0)

## 2021-12-21 LAB — TROPONIN I (HIGH SENSITIVITY): Troponin I (High Sensitivity): 5 ng/L (ref <18)

## 2021-12-21 LAB — LACTIC ACID, PLASMA: Lactic Acid, Venous: 1.4 mmol/L (ref 0.5–1.9)

## 2021-12-21 MED ORDER — INSULIN GLARGINE-YFGN 100 UNIT/ML ~~LOC~~ SOLN
5.0000 [IU] | Freq: Every day | SUBCUTANEOUS | Status: DC
Start: 2021-12-21 — End: 2021-12-22
  Filled 2021-12-21 (×2): qty 0.05

## 2021-12-21 MED ORDER — PANTOPRAZOLE SODIUM 40 MG PO TBEC
40.0000 mg | DELAYED_RELEASE_TABLET | Freq: Every day | ORAL | Status: DC
  Administered 2021-12-22: 40 mg via ORAL
  Filled 2021-12-21: qty 1

## 2021-12-21 MED ORDER — PHENYTOIN 50 MG PO CHEW
75.0000 mg | CHEWABLE_TABLET | Freq: Three times a day (TID) | ORAL | Status: DC
  Administered 2021-12-21 – 2021-12-22 (×3): 75 mg via ORAL
  Filled 2021-12-21 (×5): qty 1.5

## 2021-12-21 MED ORDER — POLYETHYLENE GLYCOL 3350 17 G PO PACK: 17.0000 g | PACK | Freq: Every day | ORAL | Status: DC | PRN

## 2021-12-21 MED ORDER — ENOXAPARIN SODIUM 40 MG/0.4ML IJ SOSY: 40.0000 mg | PREFILLED_SYRINGE | INTRAMUSCULAR | Status: DC

## 2021-12-21 MED ORDER — VERAPAMIL HCL ER 200 MG PO CP24: 200.0000 mg | ORAL_CAPSULE | Freq: Every day | ORAL | Status: DC

## 2021-12-21 MED ORDER — INSULIN ASPART 100 UNIT/ML IJ SOLN
0.0000 [IU] | Freq: Three times a day (TID) | INTRAMUSCULAR | Status: DC
  Administered 2021-12-22: 9 [IU] via SUBCUTANEOUS

## 2021-12-21 MED ORDER — RIVAROXABAN 10 MG PO TABS
10.0000 mg | ORAL_TABLET | Freq: Every day | ORAL | Status: DC
  Administered 2021-12-21: 10 mg via ORAL
  Filled 2021-12-21: qty 1

## 2021-12-21 MED ORDER — SODIUM CHLORIDE 0.9 % IV BOLUS
1000.0000 mL | Freq: Once | INTRAVENOUS | Status: AC
  Administered 2021-12-21: 1000 mL via INTRAVENOUS

## 2021-12-21 MED ORDER — SODIUM CHLORIDE 0.9 % IV SOLN
10.0000 mg/kg | Freq: Once | INTRAVENOUS | Status: AC
  Administered 2021-12-21: 590 mg via INTRAVENOUS
  Filled 2021-12-21: qty 11.8

## 2021-12-21 MED ORDER — LEVOTHYROXINE SODIUM 75 MCG PO TABS
75.0000 ug | ORAL_TABLET | Freq: Every day | ORAL | Status: DC
  Administered 2021-12-22: 75 ug via ORAL
  Filled 2021-12-21: qty 1

## 2021-12-21 MED ORDER — TRANDOLAPRIL 1 MG PO TABS
2.0000 mg | ORAL_TABLET | Freq: Every day | ORAL | Status: DC
  Administered 2021-12-22: 2 mg via ORAL
  Filled 2021-12-21: qty 2

## 2021-12-21 NOTE — ED Notes (Signed)
Patient was alseep when I walked into the room. Gave her medications and talked about insulin needs while in here in the hospital

## 2021-12-21 NOTE — H&P (Cosign Needed)
Date: 12/21/2021               Patient Name:  [name] MRN: [id number]  DOB: Mar 05, 1930 Age / Sex: 88 y.o., female   PCP: Bartholome Bill, MD         Medical Service: Internal Medicine Teaching Service         Attending Physician: Dr. Lucious Groves, DO    First Contact: Johny Blamer, DO      Pager: Lavone Nian [phone]      Second Contact: Sanjuana Letters, DO     Pager: Maryjean Morn [phone]        After Hours (After 5p/  First Contact Pager: [phone]  weekends / holidays): Second Contact Pager: [phone]   SUBJECTIVE   Chief Complaint: Seizure Like Activity  History of Present Illness:  [name] is an 88 year old female with a past medical history of seizure disorder, T2DM, HTN, essential tremor, right frontal meningioma s/p resection in 2015, colon cancer s/p hemicolectomy 2021, DCIS of right breast s/p lumpectomy 2014, and hypothyroidism who presented after a period of seizure-like activity.  The patient does not remember the event or much leading up to it.  She does remember that she has been feeling normal and woke up and had her usual breakfast.  Per her sister she was sitting on the couch watching TV when she noticed that her head falling backwards and that she was in responding to her name.  She states this lasted about 15 minutes until emergency services arrived.  When they got there they gave her oxygen.  She did not immediately return to baseline but seem to have a drawn out postictal state.  However now in the ED the patient is at baseline.  Patient and sister note that she has not been sick recently or had any sick contacts.  She did not have an abnormal morning or have any recent changes in her medications.  Denies any fevers, dizziness, lightheadedness, other LOC, recent fatigue, chest pain, cough, shortness of breath, or dysuria.  She has had some dark stools recently as well as some pain in her lower abdomen that is intermittent and does not have any known alleviating or  aggravating factors.  She also had an short episode of a headache a few days ago on the right side which she states is generally abnormal for her.  For her history of seizures including an episode where she bit her tongue in 2016 where she woke up with a left amount of blood on her pillow.  Last neurology note from Dr. Leta Baptist shows that she was doing well and that her last seizure was on Rosell 19, 2022 during which time she was in an extended care facility and [place] not have been getting her meds as usual.  She takes phenytoin 200 mg daily and primidone 250 mg daily.  Does not seem like many of her seizures have been witnessed but they seem to have a next presentation with some of them being described more like absence seizures and some being more like tonic clonic seizures.  She was having seizures both prior and after surgical resection of her meningioma.  She does state that her medication dose was decreased around 9 months ago but since then she has not had any seizures.  She does describe recent stressors in life including her brother being newly diagnosed with Alzheimer's and a close friend having to have her leg amputated.  She states these have been weighing on  her mind this week.  ED Course: She was altered and irritable on arrival to the emergency room.  Initial lab work showed hyponatremia which appears to be chronic and low phenytoin level.  Neurology was consulted and recommended loading dose of fosphenytoin and restarting her phenytoin dose spaced out into 3 times daily.  Meds:  xanax Phenytoin Primidone Insulin lantus 14 units, novolog ssi Levothyroxine Tramadal  Verapamil Trandolapril  Thiamine  Protonix   Past Medical History Seizure disorder Hypertension Essential tremor Gait abnormality right frontal meningioma s/p resection in 2015 colon cancer s/p hemicolectomy 2021 DCIS of right breast s/p lumpectomy 2014 Hypothyroidism HLD  Past Surgical History:  Procedure  Laterality Date   ABDOMINAL HYSTERECTOMY     BIOPSY  11/07/2019   Procedure: BIOPSY;  Surgeon: Ronald Lobo, MD;  Location: [facility];  Service: Endoscopy;;   BRAIN SURGERY     BREAST BIOPSY Right 01/24/2013   Procedure: RE-EXCICION OF BREAST CANCER, ANTERIOR MARGINS;  Surgeon: Edward Jolly, MD;  Location: [facility];  Service: General;  Laterality: Right;   BREAST LUMPECTOMY Right 2014   BREAST LUMPECTOMY WITH NEEDLE LOCALIZATION Right 01/17/2013   Procedure: BREAST LUMPECTOMY WITH NEEDLE LOCALIZATION;  Surgeon: Edward Jolly, MD;  Location: [place];  Service: General;  Laterality: Right;   BUNIONECTOMY Bilateral    CATARACT EXTRACTION W/ INTRAOCULAR LENS IMPLANT Right    CHOLECYSTECTOMY     COLONOSCOPY     COLONOSCOPY WITH PROPOFOL N/A 11/07/2019   Procedure: COLONOSCOPY WITH PROPOFOL;  Surgeon: Ronald Lobo, MD;  Location: [facility];  Service: Endoscopy;  Laterality: N/A;   CRANIOTOMY Right 10/18/2013   Procedure: CRANIOTOMY TUMOR EXCISION;  Surgeon: Floyce Stakes, MD;  Location: [facility];  Service: Neurosurgery;  Laterality: Right;   ESOPHAGOGASTRODUODENOSCOPY (EGD) WITH PROPOFOL N/A 11/07/2019   Procedure: ESOPHAGOGASTRODUODENOSCOPY (EGD) WITH PROPOFOL;  Surgeon: Ronald Lobo, MD;  Location: [facility];  Service: Endoscopy;  Laterality: N/A;   EYE SURGERY     HERNIA REPAIR     IR THORACENTESIS ASP PLEURAL SPACE W/IMG GUIDE  04/01/2020   KNEE ARTHROSCOPY Bilateral    LAPAROSCOPIC RIGHT HEMI COLECTOMY Right 03/06/2020   Procedure: LAPAROSCOPIC RIGHT HEMI COLECTOMY WITH TAP BLOCK AND LYSIS OF ADHESIONS;  Surgeon: Ileana Roup, MD;  Location: [facility];  Service: General;  Laterality: Right;   POLYPECTOMY     small adenomatous   TOTAL HIP ARTHROPLASTY Right 06/05/2020   Procedure: ARTHROPLASTY  ANTERIOR APPROACH. RIGHT HIP;  Surgeon: Rod Can, MD;  Location: [facility];  Service: Orthopedics;  Laterality: Right;   ULNAR TUNNEL RELEASE     Social:  Lives  alone in a home  Occupation: Retired Support: Has brother in the area and caregivers during the day Level of Function: Need support with ADL/IADL. Uses walker PCP: Precious Haws Substances: Denies tobacco, alcohol, or other substance use  Family History:  Brother - alzheimers dementia Family with history of strokes, heart attacks.  Father-pancreatic cancer Mother CVA  Allergies: Allergies as of 12/21/2021 - Review Complete 12/21/2021  Allergen Reaction Noted   Bystolic [nebivolol hcl] Other (See Comments) 01/04/2013   Carbamazepine Other (See Comments) 12/16/2006   Cholestyramine Itching, Nausea And Vomiting, Rash, and Other (See Comments) 12/16/2006   Morphine Other (See Comments) 12/16/2006   Niacin Palpitations 12/16/2006   Niaspan [niacin er] Palpitations and Other (See Comments) 01/04/2013   Norvasc [amlodipine besylate] Other (See Comments) 01/04/2013   Optivar [azelastine hcl] Photosensitivity 01/04/2013   Repaglinide Hives 06/03/2016   Sular [employer]  er] Other (See Comments) 01/04/2013   Telmisartan Other (See Comments) 12/16/2006   Amoxicillin-pot clavulanate Rash 04/22/2019   Cefdinir Swelling 12/16/2006   Ciprofloxacin hcl Hives 04/22/2019   Clonidine Other (See Comments)    Clonidine hydrochloride Other (See Comments)    Codeine Nausea And Vomiting 12/16/2006   Ezetimibe Other (See Comments) 12/16/2006   Hydroxychloroquine Other (See Comments) 04/22/2019   Naproxen Other (See Comments) 12/16/2006   Sulfa antibiotics Rash 04/22/2019   Ziac [bisoprolol-hydrochlorothiazide] Other (See Comments) 01/04/2013   Amlodipine besy-benazepril hcl Cough 02/10/2020   Azelastine Other (See Comments) 06/21/2019   Elavil [amitriptyline] Other (See Comments) 03/13/2014   Empagliflozin Other (See Comments) 06/26/2019   Hydralazine Other (See Comments) 10/25/2018   Iodine i 131 tositumomab Other (See Comments) 04/01/2020   Kenalog [triamcinolone] Diarrhea 08/08/2019   Keppra  [levetiracetam] Other (See Comments) 01/05/2014   Lamotrigine Itching and Other (See Comments) 10/30/2014   Misc. sulfonamide containing compounds  11/25/2021   Pregabalin Swelling and Other (See Comments) 03/13/2014   Pseudoephedrine Other (See Comments) 10/25/2018   Pseudoephedrine-guaifenesin er Other (See Comments) 04/01/2020   Risedronate  04/01/2020   Ru-hist d [brompheniramine-phenylephrine] Other (See Comments) 04/01/2020   Sumatriptan Other (See Comments) 02/10/2020   Topiramate Other (See Comments) 11/12/2014   Ace inhibitors Other (See Comments) 10/20/2013   Actonel [risedronate sodium] Other (See Comments) 01/04/2013   Aspirin Other (See Comments) 12/16/2006   Atacand [candesartan] Other (See Comments) 01/04/2013   Bextra [valdecoxib] Other (See Comments) 01/04/2013   Bisoprolol-hydrochlorothiazide Other (See Comments) 12/16/2006   Cefadroxil Other (See Comments) 12/16/2006   Celecoxib Rash 12/16/2006   Hydrocodone Other (See Comments) 12/16/2006   Hydrocodone-acetaminophen Other (See Comments) 12/16/2006   Iodinated contrast media Rash and Other (See Comments) 01/03/2013   Meloxicam Other (See Comments) 12/16/2006   Methylprednisolone sodium succinate Other (See Comments)    Nabumetone Other (See Comments) 12/16/2006   Penicillins Other (See Comments) 12/16/2006   Pseudoephedrine-guaifenesin Other (See Comments) 12/16/2006   Risedronate sodium Other (See Comments) 12/16/2006   Rofecoxib Other (See Comments) 12/16/2006   Ru-tuss [chlorphen-pse-atrop-hyos-scop] Other (See Comments) 01/04/2013   Sulfonamide derivatives Other (See Comments)    Sulphur [elemental sulfur] Other (See Comments) 01/04/2013   Telithromycin Other (See Comments) 12/16/2006   Terfenadine Other (See Comments) 12/16/2006   Trandolapril-verapamil hcl er Other (See Comments) 12/16/2006   Trandolapril-verapamil hcl er Other (See Comments) 12/16/2006   Valium [diazepam] Other (See Comments) 01/03/2013     Review of Systems: A complete ROS was negative except as per HPI.   OBJECTIVE:   Physical Exam: Blood pressure (!) 167/77, pulse 84, temperature 97.8 F (36.6 C), temperature source Oral, resp. rate 18, weight 59 kg, SpO2 99 %.  Constitutional: well-appearing elderly female sitting in bed, in no acute distress HENT: normocephalic atraumatic, mucous membranes moist Eyes: conjunctiva non-erythematous Neck: supple Cardiovascular: regular rate and rhythm Pulmonary/Chest: normal work of breathing on room air, lungs clear to auscultation bilaterally Abdominal: soft, non-tender, non-distended, positive bowel sounds MSK: normal bulk and tone, mild lower extremity edema Neurological: alert & oriented x 3, 5/5 strength and intact sensation in bilateral upper and lower extremities, cranial nerves II through XII intact Skin: warm and dry   Labs: CBC    Component Value Date/Time   WBC 7.7 12/21/2021 1231   RBC 3.69 (L) 12/21/2021 1231   HGB 11.6 (L) 12/21/2021 1240   HGB 11.5 12/24/2020 1143   HGB 12.7 01/04/2013 1212   HCT 34.0 (L) 12/21/2021 1240   HCT 34.3  12/24/2020 1143   HCT 37.7 01/04/2013 1212   PLT 331 12/21/2021 1231   PLT 291 12/24/2020 1143   MCV 93.8 12/21/2021 1231   MCV 91 12/24/2020 1143   MCV 89.2 01/04/2013 1212   MCH 30.9 12/21/2021 1231   MCHC 32.9 12/21/2021 1231   RDW 14.5 12/21/2021 1231   RDW 12.1 12/24/2020 1143   RDW 13.9 01/04/2013 1212   LYMPHSABS 1.7 12/21/2021 1231   LYMPHSABS 1.8 12/24/2020 1143   LYMPHSABS 1.7 01/04/2013 1212   MONOABS 0.6 12/21/2021 1231   MONOABS 0.7 01/04/2013 1212   EOSABS 0.1 12/21/2021 1231   EOSABS 0.1 12/24/2020 1143   BASOSABS 0.0 12/21/2021 1231   BASOSABS 0.0 12/24/2020 1143   BASOSABS 0.0 01/04/2013 1212     CMP     Component Value Date/Time   NA 128 (L) 12/21/2021 1240   NA 130 (L) 12/24/2020 1143   NA 138 01/04/2013 1212   K 4.4 12/21/2021 1240   K 3.9 01/04/2013 1212   CL 97 (L) 12/21/2021 1231    CO2 24 12/21/2021 1231   CO2 22 01/04/2013 1212   GLUCOSE 155 (H) 12/21/2021 1231   GLUCOSE 285 (H) 01/04/2013 1212   BUN 12 12/21/2021 1231   BUN 14 12/24/2020 1143   BUN 10.3 01/04/2013 1212   CREATININE 0.78 12/21/2021 1231   CREATININE 0.78 12/01/2013 1659   CREATININE 1.0 01/04/2013 1212   CALCIUM 8.5 (L) 12/21/2021 1231   CALCIUM 9.6 01/04/2013 1212   PROT 5.9 (L) 12/21/2021 1231   PROT 6.3 12/24/2020 1143   PROT 6.4 01/04/2013 1212   ALBUMIN 3.4 (L) 12/21/2021 1231   ALBUMIN 4.2 12/24/2020 1143   ALBUMIN 3.6 01/04/2013 1212   AST 16 12/21/2021 1231   AST 14 01/04/2013 1212   ALT 15 12/21/2021 1231   ALT 16 01/04/2013 1212   ALKPHOS 88 12/21/2021 1231   ALKPHOS 52 01/04/2013 1212   BILITOT 0.7 12/21/2021 1231   BILITOT <0.2 12/24/2020 1143   BILITOT 1.01 01/04/2013 1212   GFRNONAA >60 12/21/2021 1231   GFRAA >60 01/02/2020 0455    Imaging:  EKG: personally reviewed my interpretation is sinus bradycardia with normal intervals and normal axis. Prior EKG 04/2021 largely unchanged.  ASSESSMENT & PLAN:   Assessment & Plan by Problem: Principal Problem:   Syncope   [name] is an 88 year old female with a past medical history of seizure disorder, T2DM, HTN, essential tremor, right frontal meningioma s/p resection in 2015, colon cancer s/p hemicolectomy 2021, DCIS of right breast s/p lumpectomy 2014, and hypothyroidism who presented after a period of seizure-like activity and admitted for work-up of syncope versus seizure on hospital day 0  Seizure disorder Syncope Essential tremor Description of event with unresponsive episode lasting upwards of 10 minutes and likely postictal state with eventual return to baseline point more towards seizure.  Chart review shows that her phenytoin level has been subtherapeutic when it has been checked over the past year including here.  With her recent life stressors of her brothers Alzheimer's diagnosis and her friend having to have the  leg amputated she could have had a stress or sleep deprivation related seizure.  Further chart review shows that after being discharged following resection of her meningioma she self discontinued her Keppra and continued to have intermittent syncope and confusion which were likely recurrent seizures.  With her subtherapeutic phenytoin level we believe this is likely recurrent seizures.  CT head showed no acute intracranial abnormalities but there is  stable right frontal lobe encephalomalacia along with cerebral atrophy and small vessel ischemic change.  However she was also bradycardic on arrival and is on verapamil at home.  EKG here shows bradycardia but no signs of heart block.  She has been been evaluated in the past by cardiology with event monitor this was back in 2015.  No leukocytosis, troponin negative, no hypoglycemia, low ammonia level, no lactic acid elevation, VBG showed no acidosis.  Neurology was consulted in the ED. - Fosphenytoin loading dose - Start phenytoin 75 mg 3 times a day - Hold primidone - Primidone level, magnesium, phosphorus, TSH pending - Pending neuro evaluation, appreciate the recommendations  Hyponatremia Sodium of 129 on arrival, this appears to be chronic as she has been between 128 and 132 for the majority of her recorded labs.  We will continue to monitor.  Hypothyroidism Recheck TSH.  Continue home levothyroxine 75 mcg daily.  Anxiety Patient takes Xanax 0.125-0.25 mg nightly.  PDMP shows inconsistent prescribing of this.  We will hold this tonight as we continue to evaluate.  T2DM Home regimen of Lantus 14 units and SSI. - Semglee 5 units tonight as we advance her diet, will titrate up as needed.  Sensitive SSI  Hypertension Continue home medications trandolapril 2 mg daily and verapamil 200 mg daily  GERD Continue home pantoprazole 40 mg daily  Diet: Normal VTE: DOAC IVF: None,None Code: Full  Prior to Admission Living Arrangement: Home, living  alone with help from aids/sister Anticipated Discharge Location:  Pending PT/OT evaluation Barriers to Discharge: Clinical workup and management.  Dispo: Admit patient to Inpatient with expected length of stay greater than 2 midnights.  Signed: Johny Blamer, DO Internal Medicine Resident PGY-1 Pager: [phone]   12/21/2021, 6:34 PM

## 2021-12-21 NOTE — ED Provider Notes (Signed)
[facility] EMERGENCY DEPARTMENT Provider Note   CSN: [id number] Arrival date & time: 12/21/21  1221     History  Chief Complaint  Patient presents with   Altered Mental Status    [name] is a 88 y.o. female.  88 yo F with a chief complaint of altered mental status.  Per EMS the initial call out was for cardiac arrest.  The patient was reportedly sitting on the couch with a family member and became unresponsive.  Fire arrived and indicated CPR was in progress but when EMS showed up there was no CPR ongoing and the patient was noted to be confused.  Patient has improved since.  No obvious complaints.  Patient is moaning on initial exam but tells me that nothing is bothering her.  Has some discomfort when I range her left elbow which holds her IV.   Altered Mental Status      Home Medications Prior to Admission medications   Medication Sig Start Date End Date Taking? Authorizing Provider  acetaminophen (TYLENOL) 500 MG tablet Take 2 tablets (1,000 mg total) by mouth 3 (three) times daily. Patient taking differently: Take 1,000 mg by mouth every 8 (eight) hours as needed for moderate pain. 08/21/20   Geradine Girt, DO  ALPRAZolam Duanne Moron) 0.25 MG tablet Take by mouth. 09/22/21   [provider]  benzonatate (TESSALON) 200 MG capsule Take 1 capsule (200 mg total) by mouth 2 (two) times daily as needed for cough. 08/21/20   Geradine Girt, DO  Blood Glucose Monitoring Suppl (PRODIGY VOICE BLOOD GLUCOSE) w/Device KIT Use to check blood sugar 1 time per day. 10/18/15   Renato Shin, MD  capsicum (ZOSTRIX) 0.075 % topical cream Applied to the knees as needed up to 4 times a day.  Wash hands after application. 08/04/21   [provider]  cefdinir (OMNICEF) 300 MG capsule Take 300 mg by mouth 2 (two) times daily. 07/21/21   [provider]  Cholecalciferol (VITAMIN D-3) 125 MCG (5000 UT) TABS Take 5,000 Units by mouth daily.    [provider]  Cholecalciferol 125 MCG (5000 UT) capsule Take by mouth.    [provider]  cholestyramine (QUESTRAN) 4 g packet Take 4 g by mouth 2 (two) times daily.    [provider]  cyanocobalamin (,VITAMIN B-12,) 1000 MCG/ML injection Inject 1,000 mcg into the muscle every 21 ( twenty-one) days.    [provider]  diclofenac Sodium (VOLTAREN) 1 % GEL Apply 2 g topically 4 (four) times daily as needed (for knee and back pain). 06/01/19   [provider]  diphenhydrAMINE (BENADRYL) 25 mg capsule Take by mouth.    [provider]  diphenhydrAMINE (BENADRYL) 25 MG tablet Take 50 mg by mouth at bedtime.    [provider]  feeding supplement, GLUCERNA SHAKE, (GLUCERNA SHAKE) LIQD Take 237 mLs by mouth daily as needed (for poor appetite).    [provider]  furosemide (LASIX) 20 MG tablet Take by mouth. 06/06/17   [provider]  glucose blood (GLUCOSE METER TEST) test strip See admin instructions.    [provider]  Glucose Blood (PRODIGY VOICE BLOOD GLUCOSE VI)     [provider]  insulin aspart (NOVOLOG) 100 UNIT/ML FlexPen Inject 2-12 Units into the skin See admin instructions. Injects 2-10 units of insulin under the skin 3 times a day per sliding scale: CBG 0-150: 2 units; CBG 151-200: 4 units; CBG 201-250: 6  units; CBG 251-300: 8 units; CBG 301-350: 10 units; CBG 351-400: 12 units; CBG >400: 12 units and notify the MD/NP Patient taking differently: Inject 2-12 Units into the skin See admin instructions. Inject 2-10 units into the skin 3 times a day per sliding scale: CBG 0-150: 2 units; CBG 151-200: 4 units; CBG 201-250: 6 units; CBG 251-300: 8 units; CBG 301-350: 10 units; CBG 351-400: 12 units; CBG >400: 12 units and notify the MD/NP 07/08/20   Cleophas Dunker, Amy E, NP  insulin glargine (LANTUS SOLOSTAR) 100 UNIT/ML Solostar Pen Inject 10 Units into the skin at bedtime. Patient taking differently: Inject 12  Units into the skin See admin instructions. Inject 12 units into the skin every afternoon- when eating 08/21/20   Eulogio Bear U, DO  Insulin Pen Needle 32G X 4 MM MISC Used to inject insulin 3x daily 11/19/16   Renato Shin, MD  levothyroxine (SYNTHROID) 75 MCG tablet Take 1 tablet (75 mcg total) by mouth daily before breakfast. 07/08/20   [place]argo, Amy E, NP  pantoprazole (PROTONIX) 40 MG tablet Take 40 mg by mouth daily. 09/22/21   [provider]  phenytoin (DILANTIN) 200 MG ER capsule Take 1 capsule (200 mg total) by mouth at bedtime. 07/09/21   Penumalli, Earlean Polka, MD  Polyethyl Glycol-Propyl Glycol (SYSTANE) 0.4-0.3 % SOLN Place 2 drops into both eyes 3 (three) times daily.    [provider]  primidone (MYSOLINE) 250 MG tablet Take 1 tablet by mouth at bedtime. 09/30/21   [provider]  sulfamethoxazole-trimethoprim (BACTRIM) 400-80 MG tablet Take 1 tablet by mouth 2 (two) times daily. 04/02/21   [provider]  thiamine 250 MG tablet 1 tablet (250 mg total).    [provider]  traMADol (ULTRAM) 50 MG tablet Take by mouth.    [provider]  trandolapril (MAVIK) 2 MG tablet Take 2 mg by mouth daily.    [provider]  Verapamil HCl CR 200 MG CP24 Take 200 mg by mouth daily. 10/19/20   [provider]      Allergies    Bystolic [nebivolol hcl], Carbamazepine, Cholestyramine, Morphine, Niacin, Niaspan [niacin er], Norvasc [amlodipine besylate], Optivar [azelastine hcl], Repaglinide, Sular [nisoldipine er], Telmisartan, Amoxicillin-pot clavulanate, Cefdinir, Ciprofloxacin hcl, Clonidine, Clonidine hydrochloride, Codeine, Ezetimibe, Hydroxychloroquine, Naproxen, Sulfa antibiotics, Ziac [bisoprolol-hydrochlorothiazide], Amlodipine besy-benazepril hcl, Azelastine, Elavil [amitriptyline], Empagliflozin, Hydralazine, Iodine i 131 tositumomab, Kenalog [triamcinolone], Keppra [levetiracetam], Lamotrigine, Misc. sulfonamide containing  compounds, Pregabalin, Pseudoephedrine, Pseudoephedrine-guaifenesin er, Risedronate, Ru-hist d [brompheniramine-phenylephrine], Sumatriptan, Topiramate, Ace inhibitors, Actonel [risedronate sodium], Aspirin, Atacand [candesartan], Bextra [valdecoxib], Bisoprolol-hydrochlorothiazide, Cefadroxil, Celecoxib, Hydrocodone, Hydrocodone-acetaminophen, Iodinated contrast media, Meloxicam, Methylprednisolone sodium succinate, Nabumetone, Penicillins, Pseudoephedrine-guaifenesin, Risedronate sodium, Rofecoxib, Ru-tuss [chlorphen-pse-atrop-hyos-scop], Sulfonamide derivatives, Sulphur [elemental sulfur], Telithromycin, Terfenadine, Trandolapril-verapamil hcl er, Trandolapril-verapamil hcl er, and Valium [diazepam]    Review of Systems   Review of Systems  Physical Exam Updated Vital Signs BP (!) 178/89   Pulse 71   Temp 97.8 F (36.6 C) (Oral)   Resp (!) 26   Wt 59 kg Comment: stated weight per patient  SpO2 100%   BMI 20.06 kg/m  Physical Exam Vitals and nursing note reviewed.  Constitutional:      General: She is not in acute distress.    Appearance: She is well-developed. She is not diaphoretic.  HENT:     Head: Normocephalic and atraumatic.  Eyes:     Pupils: Pupils are equal, round, and reactive to light.  Cardiovascular:     Rate and Rhythm: Normal rate and  regular rhythm.     Heart sounds: No murmur heard.    No friction rub. No gallop.  Pulmonary:     Effort: Pulmonary effort is normal.     Breath sounds: No wheezing or rales.  Abdominal:     General: There is no distension.     Palpations: Abdomen is soft.     Tenderness: There is no abdominal tenderness.  Musculoskeletal:        General: No tenderness.     Cervical back: Normal range of motion and neck supple.     Comments: Palpated from head to toe.  Tenderness stated at the IV site.  Completed tenderness transiently at the ankle.  When exposed no obvious signs of trauma.  Repalpated without any discomfort.  Full range of  motion.  Skin:    General: Skin is warm and dry.  Neurological:     Mental Status: She is alert.     Comments: Patient does not seem to be oriented to scenario.  Psychiatric:        Behavior: Behavior normal.     ED Results / Procedures / Treatments   Labs (all labs ordered are listed, but only abnormal results are displayed) Labs Reviewed  COMPREHENSIVE METABOLIC PANEL - Abnormal; Notable for the following components:      Result Value   Sodium 129 (*)    Chloride 97 (*)    Glucose, Bld 155 (*)    Calcium 8.5 (*)    Total Protein 5.9 (*)    Albumin 3.4 (*)    All other components within normal limits  CBC WITH DIFFERENTIAL/PLATELET - Abnormal; Notable for the following components:   RBC 3.69 (*)    Hemoglobin 11.4 (*)    HCT 34.6 (*)    All other components within normal limits  PHENYTOIN LEVEL, TOTAL - Abnormal; Notable for the following components:   Phenytoin Lvl <2.5 (*)    All other components within normal limits  CBG MONITORING, ED - Abnormal; Notable for the following components:   Glucose-Capillary 148 (*)    All other components within normal limits  I-STAT VENOUS BLOOD GAS, ED - Abnormal; Notable for the following components:   pCO2, Ven 34.8 (*)    Sodium 128 (*)    Calcium, Ion 1.05 (*)    HCT 34.0 (*)    Hemoglobin 11.6 (*)    All other components within normal limits  AMMONIA  LACTIC ACID, PLASMA  URINALYSIS, ROUTINE W REFLEX MICROSCOPIC  PRIMIDONE, SERUM  TROPONIN I (HIGH SENSITIVITY)    EKG EKG Interpretation  Date/Time:  Sunday December 21 2021 12:34:11 EDT Ventricular Rate:  54 PR Interval:  194 QRS Duration: 104 QT Interval:  456 QTC Calculation: 433 R Axis:   -46 Text Interpretation: Sinus rhythm Inferior infarct, old Anterior infarct, old isolated flipped t waves in lead III not seen on prior Otherwise no significant change Confirmed by Deno Etienne [phone]) on 12/21/2021 12:57:21 PM  Radiology CT HEAD WO CONTRAST  Result Date:  12/21/2021 CLINICAL DATA:  Delirium EXAM: CT HEAD WITHOUT CONTRAST TECHNIQUE: Contiguous axial images were obtained from the base of the skull through the vertex without intravenous contrast. RADIATION DOSE REDUCTION: This exam was performed according to the departmental dose-optimization program which includes automated exposure control, adjustment of the mA and/or kV according to patient size and/or use of iterative reconstruction technique. COMPARISON:  01/29/2021 FINDINGS: Brain: Expected cerebral and cerebellar volume loss for age. Mild for age low density in the  periventricular white matter likely related to small vessel disease. Encephalomalacia involving the right frontal lobe, similar. Remote lacunar infarcts within the left basal ganglia and internal and internal capsule. No mass lesion, hemorrhage, hydrocephalus, acute infarct, intra-axial, or extra-axial fluid collection. Vascular: No hyperdense vessel or unexpected calcification. Intracranial atherosclerosis. Skull: No scalp soft tissue swelling. Right frontal craniotomy. Sinuses/Orbits: Suspect soft tissue swelling about the lateral right globe including on 33/4. Clear paranasal sinuses and mastoid air cells. Other: None. IMPRESSION: 1. No acute intracranial abnormality. 2. Right frontal lobe encephalomalacia, as before. 3. Cerebral atrophy and small vessel ischemic change. 4. Suspect soft tissue swelling about the lateral right globe. Correlate with physical exam. Electronically Signed   By: Abigail Miyamoto M.D.   On: 12/21/2021 13:17   DG Chest Port 1 View  Result Date: 12/21/2021 CLINICAL DATA:  Altered mental status. EXAM: PORTABLE CHEST 1 VIEW COMPARISON:  04/03/2021 FINDINGS: Stable cardiomediastinal contours. Lung volumes are low. No pleural effusion or edema. No airspace opacities identified. IMPRESSION: Low lung volumes.  No acute abnormality. Electronically Signed   By: Kerby Moors M.D.   On: 12/21/2021 12:51    Procedures Procedures     Medications Ordered in ED Medications  fosPHENYtoin (CEREBYX) 590 mg PE in sodium chloride 0.9 % 25 mL IVPB (has no administration in time range)  sodium chloride 0.9 % bolus 1,000 mL (1,000 mLs Intravenous New Bag/Given 12/21/21 1325)    ED Course/ Medical Decision Making/ A&P                           Medical Decision Making Amount and/or Complexity of Data Reviewed Labs: ordered. Radiology: ordered.   88 yo F with a chief complaint of unresponsiveness.  She was initially called out as a CPR in progress.  By the time EMS arrived the patient had a GCS reported of 12.  Improved in route to 14.  Patient has a history of seizures but had no reported tonic-clonic like activity which is typical of her seizures.  Patient is moaning, but denies any areas of pain.  We will obtain a laboratory evaluation.  On record review the patient appears to be on Dilantin.  We will check a level.  CT of the head.  Reassess.   Patient is still quite irritable on exam.  She tells me that she is feels very anxious.  She has trouble describing this in any other way.  CT of the head without obvious concerning intracranial pathology.  Patient's sodium and chloride are a bit low we will give a bolus of IV fluids for possible dehydration.  No significant anemia no leukocytosis.  Lactate is normal ammonia negative no hypercarbia. her phenytoin level is completely unmeasurable.  I discussed this with Dr.Khaliqdina, recommended loading her on fosphenytoin.  He recommended continuing her Dilantin dose but adding up and dividing it into 3 times daily oral dosing.  As the patient is not quite back to baseline and I am not sure of the exact events that occurred to make her unconscious will discuss with medicine about admission.  CRITICAL CARE Performed by: Cecilio Asper   Total critical care time: 35 minutes  Critical care time was exclusive of separately billable procedures and treating other  patients.  Critical care was necessary to treat or prevent imminent or life-threatening deterioration.  Critical care was time spent personally by me on the following activities: development of treatment plan with patient and/or surrogate as  well as nursing, discussions with consultants, evaluation of patient's response to treatment, examination of patient, obtaining history from patient or surrogate, ordering and performing treatments and interventions, ordering and review of laboratory studies, ordering and review of radiographic studies, pulse oximetry and re-evaluation of patient's condition.  The patients results and plan were reviewed and discussed.   Any x-rays performed were independently reviewed by myself.   Differential diagnosis were considered with the presenting HPI.  Medications  fosPHENYtoin (CEREBYX) 590 mg PE in sodium chloride 0.9 % 25 mL IVPB (has no administration in time range)  sodium chloride 0.9 % bolus 1,000 mL (1,000 mLs Intravenous New Bag/Given 12/21/21 1325)    Vitals:   12/21/21 1245 12/21/21 1315 12/21/21 1330 12/21/21 1400  BP: (!) 169/60 (!) 189/88 (!) 178/89   Pulse: (!) 54 68 71   Resp: 13 (!) 25 (!) 26   Temp:      TempSrc:      SpO2: 97% 99% 100%   Weight:    59 kg    Final diagnoses:  Loss of consciousness ([place])    Admission/ observation were discussed with the admitting physician, patient and/or family and they are comfortable with the plan.          Final Clinical Impression(s) / ED Diagnoses Final diagnoses:  Loss of consciousness [facility])    Rx / DC Orders ED Discharge Orders     None         Deno Etienne, DO 12/21/21 1412

## 2021-12-21 NOTE — ED Notes (Signed)
Patient is eating dinner

## 2021-12-21 NOTE — ED Triage Notes (Signed)
[facility] called for cardiac arrest. Pt was watching tv with her family member, pt went completely unresponsive. fire stated they were doing CPR, when [facility] arrived GCS 12, pulses back. Upon EMS arrival GCS 14. Pt has hx of seizures, has not had her medication today.   BP 162/90 HR 68 RR 20 CBG 181

## 2021-12-22 DIAGNOSIS — Z794 Long term (current) use of insulin: Secondary | ICD-10-CM | POA: Diagnosis not present

## 2021-12-22 DIAGNOSIS — I10 Essential (primary) hypertension: Secondary | ICD-10-CM | POA: Diagnosis not present

## 2021-12-22 DIAGNOSIS — R251 Tremor, unspecified: Secondary | ICD-10-CM | POA: Diagnosis not present

## 2021-12-22 DIAGNOSIS — E871 Hypo-osmolality and hyponatremia: Secondary | ICD-10-CM | POA: Diagnosis not present

## 2021-12-22 DIAGNOSIS — G40909 Epilepsy, unspecified, not intractable, without status epilepticus: Secondary | ICD-10-CM | POA: Diagnosis not present

## 2021-12-22 DIAGNOSIS — E1165 Type 2 diabetes mellitus with hyperglycemia: Secondary | ICD-10-CM | POA: Diagnosis not present

## 2021-12-22 DIAGNOSIS — E039 Hypothyroidism, unspecified: Secondary | ICD-10-CM

## 2021-12-22 DIAGNOSIS — R55 Syncope and collapse: Secondary | ICD-10-CM | POA: Diagnosis not present

## 2021-12-22 DIAGNOSIS — K219 Gastro-esophageal reflux disease without esophagitis: Secondary | ICD-10-CM

## 2021-12-22 DIAGNOSIS — F419 Anxiety disorder, unspecified: Secondary | ICD-10-CM

## 2021-12-22 LAB — BASIC METABOLIC PANEL
Anion gap: 8 (ref 5–15)
BUN: 10 mg/dL (ref 8–23)
CO2: 22 mmol/L (ref 22–32)
Calcium: 8.1 mg/dL — ABNORMAL LOW (ref 8.9–10.3)
Chloride: 102 mmol/L (ref 98–111)
Creatinine, Ser: 0.65 mg/dL (ref 0.44–1.00)
GFR, Estimated: >60 mL/min (ref >60)
Glucose, Bld: 146 mg/dL — ABNORMAL HIGH (ref 70–99)
Potassium: 4 mmol/L (ref 3.5–5.1)
Sodium: 132 mmol/L — ABNORMAL LOW (ref 135–145)

## 2021-12-22 LAB — CBC
HCT: 31.5 % — ABNORMAL LOW (ref 36.0–46.0)
Hemoglobin: 10.8 g/dL — ABNORMAL LOW (ref 12.0–15.0)
MCH: 31.4 pg (ref 26.0–34.0)
MCHC: 34.3 g/dL (ref 30.0–36.0)
MCV: 91.6 fL (ref 80.0–100.0)
Platelets: 294 10*3/uL (ref 150–400)
RBC: 3.44 MIL/uL — ABNORMAL LOW (ref 3.87–5.11)
RDW: 14.5 % (ref 11.5–15.5)
WBC: 6 10*3/uL (ref 4.0–10.5)
nRBC: 0 % (ref 0.0–0.2)

## 2021-12-22 LAB — CBG MONITORING, ED
Glucose-Capillary: 401 mg/dL — ABNORMAL HIGH (ref 70–99)
Glucose-Capillary: 209 mg/dL — ABNORMAL HIGH (ref 70–99)

## 2021-12-22 MED ORDER — VERAPAMIL HCL ER 180 MG PO TBCR
180.0000 mg | EXTENDED_RELEASE_TABLET | Freq: Every day | ORAL | Status: DC
  Administered 2021-12-22: 180 mg via ORAL
  Filled 2021-12-22: qty 1

## 2021-12-22 MED ORDER — PHENYTOIN SODIUM EXTENDED 300 MG PO CAPS: 300.0000 mg | ORAL_CAPSULE | Freq: Every day | ORAL | 0 refills | Status: DC

## 2021-12-22 NOTE — ED Notes (Signed)
Emelda Fear request a call when pt is discharged

## 2021-12-22 NOTE — Progress Notes (Signed)
Physical Therapy Evaluation Patient Details Name: [name] MRN: [id number] DOB: April 12, 1930 Today's Date: 12/22/2021  History of Present Illness  47 female presenting to ED via EMS on 8/20 with unresponsiveness at home. PMH including seizure disorder, T2DM, HTN, essential tremor, right frontal meningioma s/p resection in 2015, colon cancer s/p hemicolectomy 2021, DCIS of right breast s/p lumpectomy 2014, and hypothyroidism.  Clinical Impression  Pt was seen for mobility today, using rollator as was her PLOF.  Has significant weakness on L knee which per pt is the source of her falls at home.  Pt will be recommended to have HHPT and continue with daily assistance as was her PLOF.  Family and caregiver assist is fairly well arranged, but will need to see how her safety with RW progresses with acute PT treatment.  Encourage OOB to chair, stair training, balance challenges and endurance work along with L knee strengthening.       Recommendations for follow up therapy are one component of a multi-disciplinary discharge planning process, led by the attending physician.  Recommendations [place] be updated based on patient status, additional functional criteria and insurance authorization.  Follow Up Recommendations Home health PT      Assistance Recommended at Discharge Intermittent Supervision/Assistance  Patient can return home with the following  A little help with walking and/or transfers;A little help with bathing/dressing/bathroom;Assistance with cooking/housework;Assist for transportation;Help with stairs or ramp for entrance    Equipment Recommendations None recommended by PT  Recommendations for Other Services       Functional Status Assessment Patient has had a recent decline in their functional status and demonstrates the ability to make significant improvements in function in a reasonable and predictable amount of time.     Precautions / Restrictions Precautions Precautions:  Fall Precaution Comments: seizure Restrictions Weight Bearing Restrictions: No      Mobility  Bed Mobility Overal bed mobility: Needs Assistance Bed Mobility: Supine to Sit, Sit to Supine     Supine to sit: Min assist Sit to supine: Min guard   General bed mobility comments: min assist for trunk and then min guard for safety    Transfers Overall transfer level: Needs assistance Equipment used: None Transfers: Sit to/from Stand Sit to Stand: Min guard (just for safety)                Ambulation/Gait Ambulation/Gait assistance: Min guard Gait Distance (Feet): 50 Feet Assistive device: Rollator (4 wheels) Gait Pattern/deviations: Step-through pattern, Decreased stride length Gait velocity: variable Gait velocity interpretation: <1.31 ft/sec, indicative of household ambulator Pre-gait activities: standing balance ck General Gait Details: stepping with rapid pace, quick turns and a bit impulsive  Stairs            Wheelchair Mobility    Modified Rankin (Stroke Patients Only)       Balance Overall balance assessment: Needs assistance Sitting-balance support: Feet supported Sitting balance-Leahy Scale: Good     Standing balance support: Bilateral upper extremity supported, During functional activity Standing balance-Leahy Scale: Fair                               Pertinent Vitals/Pain Pain Assessment Pain Assessment: No/denies pain    Home Living Family/patient expects to be discharged to:: Private residence Living Arrangements: Alone Available Help at Discharge: Family;Available PRN/intermittently Type of Home: House Home Access: Stairs to enter Entrance Stairs-Rails: Right Entrance Stairs-Number of Steps: 4   Home Layout: One  level Home Equipment: Conservation officer, nature (2 wheels);Rollator (4 wheels);Shower seat;BSC/3in1 Additional Comments: sister helps F-Sun, caregiver w-Th, then niece helps M-T, but none overnight    Prior Function  Prior Level of Function : Independent/Modified Independent             Mobility Comments: rollator with no assist       Hand Dominance   Dominant Hand: Right    Extremity/Trunk Assessment   Upper Extremity Assessment Upper Extremity Assessment: Defer to OT evaluation    Lower Extremity Assessment Lower Extremity Assessment: LLE deficits/detail LLE Deficits / Details: L knee strength is low at 3/5 LLE Coordination: decreased gross motor    Cervical / Trunk Assessment Cervical / Trunk Assessment: Kyphotic (mild)  Communication   Communication: No difficulties  Cognition Arousal/Alertness: Awake/alert Behavior During Therapy: WFL for tasks assessed/performed Overall Cognitive Status: Within Functional Limits for tasks assessed                                 General Comments: fairly helpful with history questions        General Comments General comments (skin integrity, edema, etc.): telemetry values are WFL and sats 97%    Exercises     Assessment/Plan    PT Assessment Patient needs continued PT services  PT Problem List Decreased strength;Decreased activity tolerance;Decreased balance;Decreased coordination;Decreased mobility       PT Treatment Interventions DME instruction;Gait training;Functional mobility training;Therapeutic activities;Therapeutic exercise;Balance training;Neuromuscular re-education;Patient/family education;Stair training    PT Goals (Current goals can be found in the Care Plan section)  Acute Rehab PT Goals Patient Stated Goal: to go home PT Goal Formulation: With patient Time For Goal Achievement: 01/05/22 Potential to Achieve Goals: Good    Frequency Min 3X/week     Co-evaluation               AM-PAC PT "6 Clicks" Mobility  Outcome Measure Help needed turning from your back to your side while in a flat bed without using bedrails?: None Help needed moving from lying on your back to sitting on the side of a  flat bed without using bedrails?: A Little Help needed moving to and from a bed to a chair (including a wheelchair)?: A Little Help needed standing up from a chair using your arms (e.g., wheelchair or bedside chair)?: A Little Help needed to walk in hospital room?: A Little Help needed climbing 3-5 steps with a railing? : A Lot 6 Click Score: 18    End of Session Equipment Utilized During Treatment: Gait belt Activity Tolerance: Patient limited by fatigue;Treatment limited secondary to medical complications (Comment) Patient left: in bed;with call bell/phone within reach Nurse Communication: Mobility status PT Visit Diagnosis: Unsteadiness on feet (R26.81);Muscle weakness (generalized) (M62.81)    Time: 7416-3845 PT Time Calculation (min) (ACUTE ONLY): 28 min   Charges:   PT Evaluation $PT Eval Moderate Complexity: 1 Mod PT Treatments $Gait Training: 8-22 mins       Ramond Dial 12/22/2021, 2:04 PM  Mee Hives, PT PhD Acute Rehab Dept. Number: [place] [facility] [place]

## 2021-12-22 NOTE — Discharge Instructions (Addendum)
You were hospitalized for a seizure. Thank you for allowing [place] to be part of your care.   Please call and schedule follow-ups with your primary care physician and your neurology office.  Please CHANGE how you take: Phenytoin-please take 300 mg at night  Please make sure to follow up with your primary care physician and neurologist to make sure you are taking the correct dose of your seizure medications.   Please call our clinic if you have any questions or concerns, we [place] be able to help and keep you from a long and expensive emergency room wait. Our clinic and after hours phone number is [phone], the best time to call is Monday through Friday 9 am to 4 pm but there is always someone available 24/7 if you have an emergency. If you need medication refills please notify your pharmacy one week in advance and they will send [place] a request.

## 2021-12-22 NOTE — Hospital Course (Addendum)
[name] is an 88 year old female with a past medical history of seizure disorder, T2DM, HTN, essential tremor, right frontal meningioma s/p resection in 2015, colon cancer s/p hemicolectomy 2021, DCIS of right breast s/p lumpectomy 2014, and hypothyroidism who presented after a period of seizure-like activity and was admitted for workup of syncope vs seizure.   Seizure disorder Syncope Essential tremor Patient presented after a suspected seizure with the description of event of an unresponsive episode lasting between 10-15 minutes followed by a postictal state and eventual return to baseline.  On arrival her phenytoin levels were undetectable. CT head showed no acute intracranial abnormalities but there is stable right frontal lobe encephalomalacia along with cerebral atrophy and small vessel ischemic change.  She was also bradycardic on arrival and is on verapamil at home.  EKG here shows bradycardia but no signs of heart block.  No leukocytosis, troponin negative, no hypoglycemia, low ammonia level, no lactic acid elevation, VBG showed no acidosis.  Neurology was consulted in the ED recommended a loading dose of fosphenytoin to restart her phenytoin home dose looking into 3 times daily dosing.  Her electrolytes were overall normal with some hyponatremia that appears to be chronic.  She did well after this and was back to baseline per her sister.  She continues to show improvement and had an uneventful night.  Chart review shows her phenytoin level has been subtherapeutic before but her primidone and phenobarbital level were therapeutic.  At that time it appears her phenytoin dose was decreased from 350 mg daily to 200 mg daily.  With her recent life stressors of her brothers Alzheimer's diagnosis and her friend having to have a leg amputated there could have been some impact of stress and/or sleep disturbances on her seizure.  Further chart review shows that after being discharged following resection of her  meningioma in 2016 she self discontinued her Keppra and continued to have intermittent syncope and confusion which were likely recurrent seizures.  We spoke to her most recent neurologist who recommended we increase the dose of phenytoin to 300 mg daily and for her to have her phenytoin level checked in about 2 weeks which will guide further management and follow-up.   Hyponatremia Sodium of 129 on arrival, this appears to be chronic as she has been between 128 and 132 for the majority of her recorded labs.    Hypothyroidism TSH was 3.062 and we continued her home levothyroxine 75 mcg daily.   Anxiety Patient takes Xanax 0.125-0.25 mg nightly.  We held this while she was admitted as we were wanting to avoid further sedating medications.   T2DM with hyperglycemia Home regimen of Lantus 14 units and SSI.  We started her on Semglee 5 units and sliding scale she was n.p.o. on arrival.  Her sugars were elevated in the morning we recommended that she return to her home dose on discharge.   Hypertension Continued home medications trandolapril 2 mg daily and verapamil 200 mg daily.  Blood pressure was well controlled during admission.   GERD Continued home pantoprazole 40 mg daily

## 2021-12-22 NOTE — ED Notes (Signed)
Patient is asleep in bed

## 2021-12-22 NOTE — ED Notes (Signed)
Patient is up and was given some saltine crackers. Patient states she is ready for breakfast due to not eating much lastnight. Call light is within reach.

## 2021-12-22 NOTE — ED Notes (Signed)
Patient is asleep.  

## 2021-12-22 NOTE — ED Notes (Signed)
MD Heber [place] made aware of CBG of 401. RN to give novalog dose of 9 units now.

## 2021-12-22 NOTE — Evaluation (Signed)
Occupational Therapy Evaluation Patient Details Name: [name] MRN: [id number] DOB: Jun 17, 1929 Today's Date: 12/22/2021   History of Present Illness 47 female presenting to ED via EMS on 8/20 with unresponsiveness at home. PMH including seizure disorder, T2DM, HTN, essential tremor, right frontal meningioma s/p resection in 2015, colon cancer s/p hemicolectomy 2021, DCIS of right breast s/p lumpectomy 2014, and hypothyroidism.    Clinical Impression   PTA, pt was living alone and performed ADLs independently and used rollator for mobility. Pt reporting she has good support from her family (sister and niece) as well as an aide who comes Wednesday and Thursdays to assist with IADLs. Pt currently performing at [place] A level with ADLs and performing mobility with single hand held A.  Pt near baseline function and very agreeable to therapy. Pt would benefit from further acute OT to facilitate safe dc. Recommend dc to home once medically stable per physician.       Recommendations for follow up therapy are one component of a multi-disciplinary discharge planning process, led by the attending physician.  Recommendations Ohms be updated based on patient status, additional functional criteria and insurance authorization.   Follow Up Recommendations  No OT follow up    Assistance Recommended at Discharge Frequent or constant Supervision/Assistance (Initital 24/7 for safety)  Patient can return home with the following      Functional Status Assessment  Patient has had a recent decline in their functional status and demonstrates the ability to make significant improvements in function in a reasonable and predictable amount of time.  Equipment Recommendations  None recommended by OT    Recommendations for Other Services       Precautions / Restrictions Precautions Precautions: Fall Restrictions Weight Bearing Restrictions: No      Mobility Bed Mobility Overal bed mobility:  Needs Assistance Bed Mobility: Supine to Sit, Sit to Supine     Supine to sit: Min assist Sit to supine: Min guard   General bed mobility comments: Min A for assisting trunk to sit upright    Transfers Overall transfer level: Needs assistance Equipment used: None Transfers: Sit to/from Stand Sit to Stand: Min guard           General transfer comment: Min Guard A for safety      Balance Overall balance assessment: Needs assistance Sitting-balance support: No upper extremity supported, Feet supported Sitting balance-Leahy Scale: Good     Standing balance support: Single extremity supported, No upper extremity supported, During functional activity Standing balance-Leahy Scale: Fair Standing balance comment: able to maintain static standing                           ADL either performed or assessed with clinical judgement   ADL Overall ADL's : Needs assistance/impaired Eating/Feeding: Set up;Sitting   Grooming: Set up;Sitting   Upper Body Bathing: Set up;Sitting   Lower Body Bathing: Min guard;Sit to/from stand   Upper Body Dressing : Set up;Sitting   Lower Body Dressing: Min guard;Sit to/from stand Lower Body Dressing Details (indicate cue type and reason): Adjusting socks at EOB without assistance Toilet Transfer: Min guard;Ambulation Toilet Transfer Details (indicate cue type and reason): simulated         Functional mobility during ADLs: Minimal assistance (single hand held) General ADL Comments: Pt performing ADLs at [facility] A level. Min A for single hand held A to perform mobility in room. Pt near baseline function. Very pleasant  and agreeable to therapy.     Vision         Perception     Praxis      Pertinent Vitals/Pain Pain Assessment Pain Assessment: Faces Faces Pain Scale: No hurt Pain Intervention(s): Monitored during session     Hand Dominance Right   Extremity/Trunk Assessment Upper Extremity  Assessment Upper Extremity Assessment: Overall WFL for tasks assessed   Lower Extremity Assessment Lower Extremity Assessment: Defer to PT evaluation   Cervical / Trunk Assessment Cervical / Trunk Assessment: Normal   Communication Communication Communication: No difficulties   Cognition Arousal/Alertness: Awake/alert Behavior During Therapy: WFL for tasks assessed/performed Overall Cognitive Status: Within Functional Limits for tasks assessed                                       General Comments  VSS on RA    Exercises     Shoulder Instructions      Home Living Family/patient expects to be discharged to:: Private residence Living Arrangements: Alone Available Help at Discharge: Family;Available PRN/intermittently   Home Access: Stairs to enter Entrance Stairs-Number of Steps: 4 Entrance Stairs-Rails: Right Home Layout: One level     Bathroom Shower/Tub: Occupational psychologist: Standard     Home Equipment: Conservation officer, nature (2 wheels);Rollator (4 wheels);Shower seat;BSC/3in1   Additional Comments: Aide comes Wed and Thurs. Neice acts as caregiver as well and comes regularly.      Prior Functioning/Environment Prior Level of Function : Independent/Modified Independent             Mobility Comments: Using rollator ADLs Comments: Performs ADLs. Family and aide does IADLs. Neice does drives.        OT Problem List: Decreased strength;Decreased range of motion;Decreased activity tolerance;Impaired balance (sitting and/or standing);Decreased knowledge of use of DME or AE;Decreased knowledge of precautions      OT Treatment/Interventions: Self-care/ADL training;Therapeutic exercise;Energy conservation;DME and/or AE instruction;Therapeutic activities;Patient/family education    OT Goals(Current goals can be found in the care plan section) Acute Rehab OT Goals Patient Stated Goal: Go home OT Goal Formulation: With patient Time For  Goal Achievement: 01/05/22 Potential to Achieve Goals: Good  OT Frequency: Min 2X/week    Co-evaluation              AM-PAC OT "6 Clicks" Daily Activity     Outcome Measure Help from another person eating meals?: A Little Help from another person taking care of personal grooming?: A Little Help from another person toileting, which includes using toliet, bedpan, or urinal?: A Little Help from another person bathing (including washing, rinsing, drying)?: A Little Help from another person to put on and taking off regular upper body clothing?: A Little Help from another person to put on and taking off regular lower body clothing?: A Little 6 Click Score: 18   End of Session Equipment Utilized During Treatment: Gait belt Nurse Communication: Mobility status  Activity Tolerance: Patient tolerated treatment well Patient left: in bed;with call bell/phone within reach  OT Visit Diagnosis: Unsteadiness on feet (R26.81);Other abnormalities of gait and mobility (R26.89);Muscle weakness (generalized) (M62.81)                Time: 2094-7096 OT Time Calculation (min): 17 min Charges:  OT General Charges $OT Visit: 1 Visit OT Evaluation $OT Eval Low Complexity: [place], OTR/L Acute Rehab Office: [phone]  [employer]  M [name] 12/22/2021, 10:09 AM

## 2021-12-22 NOTE — Discharge Summary (Signed)
Name: [name] MRN: [id number] DOB: 1929-06-16 88 y.o. PCP: Bartholome Bill, MD  Date of Admission: 12/21/2021 12:21 PM Date of Discharge: 12/22/2021 Attending Physician: Dr. Angelia Mould  Discharge Diagnosis: Principal Problem:   Syncope Active Problems:   Seizure disorder [facility])   Uncontrolled type 2 diabetes mellitus with hyperglycemia, with long-term current use of insulin [facility])    Discharge Medications: Allergies as of 12/22/2021       Reactions   Bystolic [nebivolol Hcl] Other (See Comments)   "extreme weakness, heaviness in legs & arms, swelling in legs/arms/face, swollen abdomen, pain in bladder, feet pain, soreness in chest"   Carbamazepine Other (See Comments)   Blood poisoning   Cholestyramine Itching, Nausea And Vomiting, Rash, Other (See Comments)   "itching rash on stomach, bloated, nausea, vomiting, sleeplessness, extreme pain in arms"  Patient is taking this in 2022.   Morphine Other (See Comments)   Feels morbid, weak, still in pain   Niacin Palpitations   Fast heart beat Other reaction(s): Unknown   Niaspan [niacin Er] Palpitations, Other (See Comments)   "fast heart beat, high blood pressure"   Norvasc [amlodipine Besylate] Other (See Comments)   "extreme fluid retention/pain)   Optivar [azelastine Hcl] Photosensitivity   Repaglinide Hives   Sular [nisoldipine Er] Other (See Comments)   "severe headaches, swelling eyes, hands, feet, shortness of breath, weak, flushed face, brain boiling, fluid retention, high blood sugar, nervous, heart fast beating"   Telmisartan Other (See Comments)   "headache, difficulty urinating, high blood sugar, fluid retention"   Amoxicillin-pot Clavulanate Rash   Tolerates Unasyn (>10 doses)   Cefdinir Swelling   Vaginal irritation, breathing,    Ciprofloxacin Hcl Hives   Clonidine Other (See Comments)   Dry mouth, fluid retention   Clonidine Hydrochloride Other (See Comments)   Dry mouth, fluid retention   Codeine  Nausea And Vomiting   Ezetimibe Other (See Comments)   Made weak   Hydroxychloroquine Other (See Comments)   Low platlets   Naproxen Other (See Comments)   Shrinks bladder   Sulfa Antibiotics Rash   Ziac [bisoprolol-hydrochlorothiazide] Other (See Comments)   "stopped urination"   Amlodipine Besy-benazepril Hcl Cough   Azelastine Other (See Comments)   Unknown reaction - Per MAR   Elavil [amitriptyline] Other (See Comments)   Gave Pt nightmares   Empagliflozin Other (See Comments)   "Caused yeast infection, slowed my urine"   Hydralazine Other (See Comments)   "does not reduce high blood pressure, pain in arm, high pressure, felt like I was on verge of heart attack, really weak"   Iodine I 131 Tositumomab Other (See Comments)   Unknown reaction - Per MAR   Kenalog [triamcinolone] Diarrhea   Per MAR   Keppra [levetiracetam] Other (See Comments)   Shaking   Lamotrigine Itching, Other (See Comments)   Misc. Sulfonamide Containing Compounds    Pregabalin Swelling, Other (See Comments)   Weight gain   Pseudoephedrine Other (See Comments)   Unknown reaction   Pseudoephedrine-guaifenesin Er Other (See Comments)   Unknown reaction - Per MAR   Risedronate    Unknown reaction - Per MAR   Ru-hist D [brompheniramine-phenylephrine] Other (See Comments)   Unknown reaction - Per MAR   Sumatriptan Other (See Comments)   Topiramate Other (See Comments)   Dry eyes   Ace Inhibitors Other (See Comments)   unknown   Actonel [risedronate Sodium] Other (See Comments)   unknown   Aspirin Other (See Comments)  Unknown reaction   Atacand [candesartan] Other (See Comments)   Unknown reaction - MAR   Bextra [valdecoxib] Other (See Comments)   Unknown reaction - Per MAR   Bisoprolol-hydrochlorothiazide Other (See Comments)   Unknown reaction - Per MAR   Cefadroxil Other (See Comments)   Unknown reaction   Celecoxib Rash   Hydrocodone Other (See Comments)   unknown    Hydrocodone-acetaminophen Other (See Comments)   unknown   Iodinated Contrast Media Rash, Other (See Comments)   "All over" rash   Meloxicam Other (See Comments)   unknown   Methylprednisolone Sodium Succinate Other (See Comments)   unknown   Nabumetone Other (See Comments)   Unknown reaction   Penicillins Other (See Comments)   unknown   Pseudoephedrine-guaifenesin Other (See Comments)   unknown   Risedronate Sodium Other (See Comments)   unknown   Rofecoxib Other (See Comments)   Unknown reaction - MAR Other reaction(s): Unknown   Ru-tuss [chlorphen-pse-atrop-hyos-scop] Other (See Comments)   unknown   Sulfonamide Derivatives Other (See Comments)   unknown   Sulphur [elemental Sulfur] Other (See Comments)   unknown   Telithromycin Other (See Comments)   unknown   Terfenadine Other (See Comments)   Unknown reaction - Per MAR   Trandolapril-verapamil Hcl Er Other (See Comments)   Headache, difficulty urinating, high blood sugar, fluid retention Pt is taking Tarka (trandolapril-verapamil) currently, but requests the medication stay in her allergy list   Trandolapril-verapamil Hcl Er Other (See Comments)   Headache, difficulty urinating, high blood sugar, fluid retention Pt is taking Tarka (trandolapril-verapamil) currently, but requests the medication stay in her allergy list   Valium [diazepam] Other (See Comments)   Makes her mean and hyper        Medication List     TAKE these medications    acetaminophen 500 MG tablet Commonly known as: TYLENOL Take 2 tablets (1,000 mg total) by mouth 3 (three) times daily. What changed:  when to take this reasons to take this   ALPRAZolam 0.25 MG tablet Commonly known as: XANAX Take 0.25 mg by mouth 2 (two) times daily as needed for anxiety.   benzonatate 200 MG capsule Commonly known as: TESSALON Take 1 capsule (200 mg total) by mouth 2 (two) times daily as needed for cough.   Biotin 5000 MCG Caps Take 5,000 mcg by  mouth 2 (two) times daily.   capsicum 0.075 % topical cream Commonly known as: ZOSTRIX Apply 1 Application topically 4 (four) times daily as needed (pain).   cholestyramine 4 g packet Commonly known as: QUESTRAN Take 4 g by mouth 2 (two) times daily.   cyanocobalamin 1000 MCG/ML injection Commonly known as: VITAMIN B12 Inject 1,000 mcg into the muscle every 21 ( twenty-one) days.   diclofenac Sodium 1 % Gel Commonly known as: VOLTAREN Apply 2 g topically 4 (four) times daily as needed (for knee and back pain).   diphenhydrAMINE 25 mg capsule Commonly known as: BENADRYL Take 25 mg by mouth every 8 (eight) hours as needed for allergies or sleep.   feeding supplement (GLUCERNA SHAKE) Liqd Take 237 mLs by mouth daily as needed (for poor appetite).   Glucose Meter Test test strip Generic drug: glucose blood See admin instructions.   PRODIGY VOICE BLOOD GLUCOSE VI   insulin aspart 100 UNIT/ML FlexPen Commonly known as: NOVOLOG Inject 2-12 Units into the skin See admin instructions. Injects 2-10 units of insulin under the skin 3 times a day per sliding scale: CBG 0-150:  2 units; CBG 151-200: 4 units; CBG 201-250: 6 units; CBG 251-300: 8 units; CBG 301-350: 10 units; CBG 351-400: 12 units; CBG >400: 12 units and notify the MD/NP What changed: additional instructions   Insulin Pen Needle 32G X 4 MM Misc Used to inject insulin 3x daily   Lantus SoloStar 100 UNIT/ML Solostar Pen Generic drug: insulin glargine Inject 10 Units into the skin at bedtime. What changed:  how much to take when to take this additional instructions   levothyroxine 75 MCG tablet Commonly known as: SYNTHROID Take 1 tablet (75 mcg total) by mouth daily before breakfast.   pantoprazole 40 MG tablet Commonly known as: PROTONIX Take 40 mg by mouth daily.   phenytoin 300 MG ER capsule Commonly known as: DILANTIN Take 1 capsule (300 mg total) by mouth at bedtime. What changed:  medication strength how  much to take   polyethylene glycol 17 g packet Commonly known as: MIRALAX / GLYCOLAX Take 17 g by mouth daily as needed for mild constipation.   primidone 250 MG tablet Commonly known as: MYSOLINE Take 250 mg by mouth at bedtime.   Prodigy Voice Blood Glucose w/Device Kit Use to check blood sugar 1 time per day.   Systane 0.4-0.3 % Soln Generic drug: Polyethyl Glycol-Propyl Glycol Place 2 drops into both eyes 3 (three) times daily.   thiamine 250 MG tablet Take 250 mg by mouth daily.   traMADol 50 MG tablet Commonly known as: ULTRAM Take 50 mg by mouth every 8 (eight) hours as needed for moderate pain.   trandolapril 2 MG tablet Commonly known as: MAVIK Take 2 mg by mouth daily.   Verapamil HCl CR 200 MG Cp24 Take 200 mg by mouth daily.   Vitamin D-3 125 MCG (5000 UT) Tabs Take 5,000 Units by mouth daily.        Disposition and follow-up:   Ms.Kamil S Whetsell was discharged from [facility] in Good condition.  At the hospital follow up visit please address:  1.  Follow-up:  a.  Phenytoin level in 2 weeks and base further neurology follow-up and medication changes of this.    b.  Follow-up on her adherence to other medications she struggles with a lot of her ADLs already she [place] need further assistance with her medications going forward.  2.  Labs / imaging needed at time of follow-up: Phenytoin level, primidone level, phenobarbital level, BMP and CBC  3.  Pending labs/ test needing follow-up: Primidone level  4.  Medication Changes Phenytoin 300 mg daily  Follow-up Appointments:  Follow-up Information     Bartholome Bill, MD Follow up.   Specialty: Family Medicine Contact information: [place] [address] [phone]         [facility]. Call.   Contact information: [address]     [address] [place] [address] [place] [facility] Course by problem list: Sahian [place] is an 88 year old female with a past medical history of seizure disorder, T2DM, HTN, essential tremor, right frontal meningioma s/p resection in 2015, colon cancer s/p hemicolectomy 2021, DCIS of right breast s/p lumpectomy 2014, and hypothyroidism who presented after a period of seizure-like activity and was admitted for workup of syncope vs seizure.   Seizure disorder Syncope Essential tremor Patient presented after a suspected seizure with the description of event of an unresponsive episode  lasting between 10-15 minutes followed by a postictal state and eventual return to baseline.  On arrival her phenytoin levels were undetectable. CT head showed no acute intracranial abnormalities but there is stable right frontal lobe encephalomalacia along with cerebral atrophy and small vessel ischemic change.  She was also bradycardic on arrival and is on verapamil at home.  EKG here shows bradycardia but no signs of heart block.  No leukocytosis, troponin negative, no hypoglycemia, low ammonia level, no lactic acid elevation, VBG showed no acidosis.  Neurology was consulted in the ED recommended a loading dose of fosphenytoin to restart her phenytoin home dose looking into 3 times daily dosing.  Her electrolytes were overall normal with some hyponatremia that appears to be chronic.  She did well after this and was back to baseline per her sister.  She continues to show improvement and had an uneventful night.  Chart review shows her phenytoin level has been subtherapeutic before but her primidone and phenobarbital level were therapeutic.  At that time it appears her phenytoin dose was decreased from 350 mg daily to 200 mg daily.  With her recent life stressors of her brothers Alzheimer's diagnosis and her friend having to have a leg amputated there could have been some impact of stress and/or sleep disturbances on her seizure.  Further chart review shows that after  being discharged following resection of her meningioma in 2016 she self discontinued her Keppra and continued to have intermittent syncope and confusion which were likely recurrent seizures.  We spoke to her most recent neurologist who recommended we increase the dose of phenytoin to 300 mg daily and for her to have her phenytoin level checked in about 2 weeks which will guide further management and follow-up.   Hyponatremia Sodium of 129 on arrival, this appears to be chronic as she has been between 128 and 132 for the majority of her recorded labs.    Hypothyroidism TSH was 3.062 and we continued her home levothyroxine 75 mcg daily.   Anxiety Patient takes Xanax 0.125-0.25 mg nightly.  We held this while she was admitted as we were wanting to avoid further sedating medications.   T2DM with hyperglycemia Home regimen of Lantus 14 units and SSI.  We started her on Semglee 5 units and sliding scale she was n.p.o. on arrival.  Her sugars were elevated in the morning we recommended that she return to her home dose on discharge.   Hypertension Continued home medications trandolapril 2 mg daily and verapamil 200 mg daily.  Blood pressure was well controlled during admission.   GERD Continued home pantoprazole 40 mg daily   Discharge Subjective:   Discharge Exam:   BP (!) 140/82   Pulse 71   Temp 98.7 F (37.1 C)   Resp (!) 24   Wt 59 kg Comment: stated weight per patient  SpO2 96%   BMI 20.06 kg/m  Constitutional: well-appearing elderly female sitting in bed, in no acute distress Cardiovascular: regular rate and rhythm Pulmonary/Chest: normal work of breathing on room air MSK: normal bulk and tone, mild lower extremity edema Neurological: alert & oriented x 3 Skin: warm and dry  Pertinent Labs, Studies, and Procedures:     Latest Ref Rng & Units 12/22/2021    4:28 AM 12/21/2021   12:40 PM 12/21/2021   12:31 PM  CBC  WBC 4.0 - 10.5 K/uL 6.0   7.7   Hemoglobin 12.0 - 15.0 g/dL  10.8  11.6  11.4   Hematocrit 36.0 -  46.0 % 31.5  34.0  34.6   Platelets 150 - 400 K/uL 294   331        Latest Ref Rng & Units 12/22/2021    4:28 AM 12/21/2021   12:40 PM 12/21/2021   12:31 PM  CMP  Glucose 70 - 99 mg/dL 146   155   BUN 8 - 23 mg/dL 10   12   Creatinine 0.44 - 1.00 mg/dL 0.65   0.78   Sodium 135 - 145 mmol/L 132  128  129   Potassium 3.5 - 5.1 mmol/L 4.0  4.4  4.3   Chloride 98 - 111 mmol/L 102   97   CO2 22 - 32 mmol/L 22   24   Calcium 8.9 - 10.3 mg/dL 8.1   8.5   Total Protein 6.5 - 8.1 g/dL   5.9   Total Bilirubin 0.3 - 1.2 mg/dL   0.7   Alkaline Phos 38 - 126 U/L   88   AST 15 - 41 U/L   16   ALT 0 - 44 U/L   15     CT HEAD WO CONTRAST  Result Date: 12/21/2021 CLINICAL DATA:  Delirium EXAM: CT HEAD WITHOUT CONTRAST TECHNIQUE: Contiguous axial images were obtained from the base of the skull through the vertex without intravenous contrast. RADIATION DOSE REDUCTION: This exam was performed according to the departmental dose-optimization program which includes automated exposure control, adjustment of the mA and/or kV according to patient size and/or use of iterative reconstruction technique. COMPARISON:  01/29/2021 FINDINGS: Brain: Expected cerebral and cerebellar volume loss for age. Mild for age low density in the periventricular white matter likely related to small vessel disease. Encephalomalacia involving the right frontal lobe, similar. Remote lacunar infarcts within the left basal ganglia and internal and internal capsule. No mass lesion, hemorrhage, hydrocephalus, acute infarct, intra-axial, or extra-axial fluid collection. Vascular: No hyperdense vessel or unexpected calcification. Intracranial atherosclerosis. Skull: No scalp soft tissue swelling. Right frontal craniotomy. Sinuses/Orbits: Suspect soft tissue swelling about the lateral right globe including on 33/4. Clear paranasal sinuses and mastoid air cells. Other: None. IMPRESSION: 1. No acute intracranial  abnormality. 2. Right frontal lobe encephalomalacia, as before. 3. Cerebral atrophy and small vessel ischemic change. 4. Suspect soft tissue swelling about the lateral right globe. Correlate with physical exam. Electronically Signed   By: Abigail Miyamoto M.D.   On: 12/21/2021 13:17   DG Chest Port 1 View  Result Date: 12/21/2021 CLINICAL DATA:  Altered mental status. EXAM: PORTABLE CHEST 1 VIEW COMPARISON:  04/03/2021 FINDINGS: Stable cardiomediastinal contours. Lung volumes are low. No pleural effusion or edema. No airspace opacities identified. IMPRESSION: Low lung volumes.  No acute abnormality. Electronically Signed   By: Kerby Moors M.D.   On: 12/21/2021 12:51     Discharge Instructions: Discharge Instructions     Call MD for:  difficulty breathing, headache or visual disturbances   Complete by: As directed    Call MD for:  extreme fatigue   Complete by: As directed    Call MD for:  persistant dizziness or light-headedness   Complete by: As directed    Call MD for:  persistant nausea and vomiting   Complete by: As directed    Call MD for:  severe uncontrolled pain   Complete by: As directed    Call MD for:  temperature >100.4   Complete by: As directed    Diet - low sodium heart healthy   Complete by: As  directed    Increase activity slowly   Complete by: As directed        Signed: Johny Blamer, DO 12/22/2021, 4:16 PM   Pager: [phone]

## 2021-12-23 DIAGNOSIS — G5602 Carpal tunnel syndrome, left upper limb: Secondary | ICD-10-CM | POA: Diagnosis not present

## 2021-12-23 DIAGNOSIS — L03114 Cellulitis of left upper limb: Secondary | ICD-10-CM | POA: Diagnosis not present

## 2021-12-23 LAB — PRIMIDONE, SERUM
Phenobarbital: 16 ug/mL (ref 15–40)
Primidone, Serum: 6.2 ug/mL — ABNORMAL LOW (ref 5.0–12.0)

## 2021-12-29 ENCOUNTER — Telehealth: Payer: Self-pay | Admitting: *Deleted

## 2021-12-29 ENCOUNTER — Encounter: Payer: Self-pay | Admitting: *Deleted

## 2021-12-29 DIAGNOSIS — G40909 Epilepsy, unspecified, not intractable, without status epilepticus: Secondary | ICD-10-CM

## 2021-12-29 DIAGNOSIS — Z5181 Encounter for therapeutic drug level monitoring: Secondary | ICD-10-CM

## 2021-12-29 NOTE — Telephone Encounter (Signed)
Received fax form [facility] re: patient has requested refill of phenytk 200 mg, recently filled phenytek 300 mg. Noted she was recently in hospital, dose was increased from 220 mg to 300 mg.

## 2021-12-29 NOTE — Telephone Encounter (Signed)
Per Dr Leta Baptist: please have patient come in for AM trough dilantin level in 1-2 weeks post-discharge. Discharge was 12/22/21. Called niece, Hoyle Sauer and discussed. Patient will come in 01/06/22 for lab draw. She takes dilantin at bedtime.  Patient is asking for Xanax refills, stated Dr Jannifer Franklin told her seizure medicine can cause her anxiety; that is why Xanax was ordered. She is asking for 90 day supply sent to [facility].  I advised will discuss with Dr Leta Baptist. Patient verbalized understanding, appreciation.

## 2021-12-29 NOTE — Telephone Encounter (Signed)
Received fax form [facility] re: Phenytek dose, 200 mg or 300 mg. On MD desk for completion, signature.

## 2021-12-30 DIAGNOSIS — G5602 Carpal tunnel syndrome, left upper limb: Secondary | ICD-10-CM | POA: Diagnosis not present

## 2021-12-30 DIAGNOSIS — L03114 Cellulitis of left upper limb: Secondary | ICD-10-CM | POA: Diagnosis not present

## 2021-12-30 MED ORDER — ALPRAZOLAM 0.25 MG PO TABS
0.2500 mg | ORAL_TABLET | Freq: Two times a day (BID) | ORAL | 5 refills | Status: DC | PRN
Start: 2021-12-30 — End: 2022-01-29

## 2021-12-30 NOTE — Telephone Encounter (Signed)
Orders Placed This Encounter  Procedures   Phenytoin Level, Total   Meds ordered this encounter  Medications   ALPRAZolam (XANAX) 0.25 MG tablet    Sig: Take 1 tablet (0.25 mg total) by mouth 2 (two) times daily as needed for anxiety.    Dispense:  60 tablet    Refill:  Gonzalez, MD 01/04/7954, 83:16 AM Certified in Neurology, Neurophysiology and Neuroimaging  [facility] [address] [place], [place] [address] [phone]

## 2021-12-31 NOTE — Telephone Encounter (Signed)
Phenytek dose 300 mg per Dr Leta Baptist. Paper faxed back to pharmacy yesterday. Received confirmation.

## 2022-01-06 ENCOUNTER — Telehealth: Payer: Self-pay | Admitting: Diagnostic Neuroimaging

## 2022-01-06 ENCOUNTER — Other Ambulatory Visit (INDEPENDENT_AMBULATORY_CARE_PROVIDER_SITE_OTHER): Payer: Self-pay

## 2022-01-06 DIAGNOSIS — G40909 Epilepsy, unspecified, not intractable, without status epilepticus: Secondary | ICD-10-CM | POA: Diagnosis not present

## 2022-01-06 DIAGNOSIS — Z5181 Encounter for therapeutic drug level monitoring: Secondary | ICD-10-CM

## 2022-01-06 DIAGNOSIS — Z0289 Encounter for other administrative examinations: Secondary | ICD-10-CM

## 2022-01-06 NOTE — Telephone Encounter (Signed)
I will be happy to see the patient.

## 2022-01-06 NOTE — Telephone Encounter (Signed)
Patient recently had a seizure and was in the hospital and was told she didn't have enough medicine in her system. The patient would prefer to see a seizure specialist instead of Dr. Leta Baptist and try to be seen as soon as possible. Is that something that can be done? She would prefer to move over if possible. Please call patient to schedule.

## 2022-01-06 NOTE — Telephone Encounter (Signed)
Pt has been scheduled with Dr April Manson and is on the wait list should anything open before the scheduled date and time.  This is FYI no call back requested by pt.

## 2022-01-06 NOTE — Telephone Encounter (Signed)
Please schedule pt with Dr April Manson as requested

## 2022-01-06 NOTE — Telephone Encounter (Signed)
Are you ok with pt transferring to Dr April Manson?

## 2022-01-07 ENCOUNTER — Telehealth: Payer: Self-pay | Admitting: Diagnostic Neuroimaging

## 2022-01-07 DIAGNOSIS — G40909 Epilepsy, unspecified, not intractable, without status epilepticus: Secondary | ICD-10-CM

## 2022-01-07 DIAGNOSIS — Z5181 Encounter for therapeutic drug level monitoring: Secondary | ICD-10-CM

## 2022-01-07 LAB — PHENYTOIN LEVEL, TOTAL: Phenytoin (Dilantin), Serum: 7.4 ug/mL — ABNORMAL LOW (ref 10.0–20.0)

## 2022-01-07 NOTE — Telephone Encounter (Signed)
Pt is calling. Pt stated she would like for a nurse to give her a call to go over lab results.

## 2022-01-08 ENCOUNTER — Encounter: Payer: Self-pay | Admitting: Diagnostic Neuroimaging

## 2022-01-08 NOTE — Telephone Encounter (Signed)
Agree with increasing the dose and rechecking the level. She is also on primidone. We can inquire about her seizure frequency when we call her.   Dr. Loletha Grayer

## 2022-01-08 NOTE — Telephone Encounter (Signed)
Called patient and advised her of Dr Jabier Mutton message. She stated she filled her pillboxes and only has one week of pills to equal 350 mg nightly.  I advised her I'll let MD know so new Rx can be sent as mail order pharmacies take longer to get meds out than local. She prefers 300 mg tabs, but I explained that [facility] does not stock that strength. Patient verbalized understanding, appreciation.

## 2022-01-08 NOTE — Telephone Encounter (Signed)
When the time comes for refill, we will send rx of 350 mg, 3.5 tablets of the 100 mg.

## 2022-01-08 NOTE — Telephone Encounter (Signed)
Called patient and advised her of Dr Penumalli's/ Dr Jabier Mutton recommendation to increase dilantin to 350 mg at night. Repeat lab in one week. She's using 200 mg, 100 mg tabs to equal 300 mg dose,  has 50 mg tabs to last her several weeks before [facility] will [facility]. She only has a few 100 mg tabs left. She wants all Rx to come [facility] due to her [facility]. She's not received Rx of 300 mg tabs sent 12/22/21. I advised I'll call to check status, call her back.  She'll have to come for lab on a Mon or Tues so niece can drive her. Patient verbalized understanding, appreciation.  [facility]ed [facility], spoke with Jaclyn Shaggy who stated she sees note stating phenytoin 300 mg drug not available, stated patient should get letter advising of this.  She stated they no longer carry phenytoin, only have dilantin in 30 mg and 100 mg strengths. Phenytoin drug was d/c by manufacturer Jun 06, 2021. Sent to MD for recommendations.

## 2022-01-08 NOTE — Telephone Encounter (Signed)
Yes, I can send the refill 350 mg nightly.  Thanks

## 2022-01-12 ENCOUNTER — Telehealth: Payer: Self-pay | Admitting: Diagnostic Neuroimaging

## 2022-01-12 MED ORDER — PHENYTOIN SODIUM EXTENDED 300 MG PO CAPS: 300.0000 mg | ORAL_CAPSULE | Freq: Every day | ORAL | 0 refills | Status: DC

## 2022-01-12 NOTE — Telephone Encounter (Signed)
Pt is calling. Stated she needs a refill on phenytoin (DILANTIN) 300 MG ER capsule. Pt would like enough medication called in to [email or web address] [place], [place], [place] [address]  ~5.2 mi [phone]  Pt is al requesting a full refill be sent to [facility]

## 2022-01-12 NOTE — Addendum Note (Signed)
Addended by: Minna Antis on: 01/12/2022 08:01 AM   Modules accepted: Orders

## 2022-01-12 NOTE — Telephone Encounter (Signed)
Pt said would like to verify phenytoin (DILANTIN) 300 MG ER capsule 90-day was sent to [facility]. Would like a call from the nurse.

## 2022-01-12 NOTE — Telephone Encounter (Signed)
Phenytoin 300 mg one month supply sent to [facility]. 90 day Refill sent to mail order this morning.

## 2022-01-12 NOTE — Telephone Encounter (Signed)
Per [facility] last note this morning, "Refill sent to mail order this morning. "

## 2022-01-12 NOTE — Addendum Note (Signed)
Addended by: Florian Buff C on: 01/12/2022 11:21 AM   Modules accepted: Orders

## 2022-01-13 ENCOUNTER — Other Ambulatory Visit (INDEPENDENT_AMBULATORY_CARE_PROVIDER_SITE_OTHER): Payer: Self-pay

## 2022-01-13 DIAGNOSIS — Z5181 Encounter for therapeutic drug level monitoring: Secondary | ICD-10-CM | POA: Diagnosis not present

## 2022-01-13 DIAGNOSIS — G40909 Epilepsy, unspecified, not intractable, without status epilepticus: Secondary | ICD-10-CM

## 2022-01-13 DIAGNOSIS — Z0289 Encounter for other administrative examinations: Secondary | ICD-10-CM

## 2022-01-13 MED ORDER — PHENYTOIN SODIUM EXTENDED 300 MG PO CAPS: 300.0000 mg | ORAL_CAPSULE | Freq: Every day | ORAL | 3 refills | Status: DC

## 2022-01-13 MED ORDER — PHENYTOIN 50 MG PO CHEW: 50.0000 mg | CHEWABLE_TABLET | Freq: Every day | ORAL | 3 refills | Status: DC

## 2022-01-13 NOTE — Addendum Note (Signed)
Addended byAlric Ran on: 01/13/2022 10:43 AM   Modules accepted: Orders

## 2022-01-14 LAB — PHENYTOIN LEVEL, TOTAL: Phenytoin (Dilantin), Serum: 7.9 ug/mL — ABNORMAL LOW (ref 10.0–20.0)

## 2022-01-15 ENCOUNTER — Telehealth: Payer: Self-pay | Admitting: Diagnostic Neuroimaging

## 2022-01-15 NOTE — Telephone Encounter (Signed)
Pt called wanting to know if the lab results for this week have come in yet. Please call pt when available.

## 2022-01-16 ENCOUNTER — Telehealth: Payer: Self-pay | Admitting: Diagnostic Neuroimaging

## 2022-01-16 DIAGNOSIS — Z5181 Encounter for therapeutic drug level monitoring: Secondary | ICD-10-CM

## 2022-01-16 DIAGNOSIS — G40909 Epilepsy, unspecified, not intractable, without status epilepticus: Secondary | ICD-10-CM

## 2022-01-16 NOTE — Telephone Encounter (Signed)
[name] from [facility] states their pharmacist is needing clarification of directions on pt's phenytoin.  She is asking they are called back at [phone] please ref# [id number] [name] stated this is their final attempt on this request. [name] was advised the office is not open on Fridays and that they should not expect a call before Monday

## 2022-01-19 DIAGNOSIS — Z794 Long term (current) use of insulin: Secondary | ICD-10-CM | POA: Diagnosis not present

## 2022-01-19 DIAGNOSIS — Z79890 Hormone replacement therapy (postmenopausal): Secondary | ICD-10-CM | POA: Diagnosis not present

## 2022-01-19 DIAGNOSIS — Z9181 History of falling: Secondary | ICD-10-CM | POA: Diagnosis not present

## 2022-01-19 DIAGNOSIS — E11649 Type 2 diabetes mellitus with hypoglycemia without coma: Secondary | ICD-10-CM | POA: Diagnosis not present

## 2022-01-19 DIAGNOSIS — M810 Age-related osteoporosis without current pathological fracture: Secondary | ICD-10-CM | POA: Diagnosis not present

## 2022-01-19 DIAGNOSIS — E039 Hypothyroidism, unspecified: Secondary | ICD-10-CM | POA: Diagnosis not present

## 2022-01-19 NOTE — Telephone Encounter (Signed)
As work in MD, please result pts labs

## 2022-01-19 NOTE — Telephone Encounter (Signed)
Dr Leta Baptist Dilantin level stable, but low. Will defer to dr April Manson about next steps. Currently on dilantin '350mg'$  daily.

## 2022-01-19 NOTE — Telephone Encounter (Signed)
Patient is to continue with Dilantin 350 mg nightly. Thank you

## 2022-01-19 NOTE — Telephone Encounter (Signed)
Dr Leta Baptist - Dilantin level stable, but low. Will defer to dr April Manson about next steps. Currently on dilantin '350mg'$  daily.

## 2022-01-19 NOTE — Telephone Encounter (Deleted)
[name] in interfusing stated she will get IVIG processed today and it [name] take up to two weeks for approval, pt is aware.

## 2022-01-19 NOTE — Telephone Encounter (Signed)
Contacted pt back, informed her and Niece of Dilantin levels. If Dr April Manson wants to adjust anything, we will advise then. Pt and niece verbally understood and was appreciative

## 2022-01-19 NOTE — Progress Notes (Signed)
Will keep current dose and check again at next visit. Thank you

## 2022-01-19 NOTE — Telephone Encounter (Signed)
Can you please clarify how this pt is suppose to take medication? Do you want to change Rx due pt labs now ?

## 2022-01-19 NOTE — Telephone Encounter (Signed)
Per Dr April Manson, patient is to continue with Dilantin 350 mg nightly. [facility] was notified of the clarification

## 2022-01-21 NOTE — Telephone Encounter (Signed)
Contacted pt back X3, no answer

## 2022-01-21 NOTE — Telephone Encounter (Signed)
Pt is called. [facility] is out of stock on thephenytoin (DILANTIN) 300 MG ER capsule. Pt is requesting

## 2022-01-21 NOTE — Telephone Encounter (Signed)
Pt is calling. [facility] is out of stock on the phenytoin (DILANTIN) 300 MG ER capsule. Pt said she would like prescription sent [facility] on [address] if they have medication but she wants a 90 day supply. Pt is requesting a nurse give her a call

## 2022-01-22 MED ORDER — PHENYTOIN SODIUM EXTENDED 300 MG PO CAPS: 300.0000 mg | ORAL_CAPSULE | Freq: Every day | ORAL | 3 refills | Status: DC

## 2022-01-22 NOTE — Addendum Note (Signed)
Addended by: Gertie Baron D on: 01/22/2022 08:36 AM   Modules accepted: Orders

## 2022-01-22 NOTE — Telephone Encounter (Signed)
Contacted pt again, informed her Rx has been sent to [facility].

## 2022-01-23 ENCOUNTER — Inpatient Hospital Stay (HOSPITAL_COMMUNITY)
Admission: EM | Admit: 2022-01-23 | Discharge: 2022-01-29 | DRG: 389 | Disposition: A | Discharge status: Skilled Nursing Facility | Payer: Medicare Other | Attending: Internal Medicine | Admitting: Internal Medicine

## 2022-01-23 ENCOUNTER — Emergency Department (HOSPITAL_COMMUNITY): Payer: Medicare Other

## 2022-01-23 ENCOUNTER — Other Ambulatory Visit: Payer: Self-pay

## 2022-01-23 DIAGNOSIS — S22009D Unspecified fracture of unspecified thoracic vertebra, subsequent encounter for fracture with routine healing: Secondary | ICD-10-CM | POA: Diagnosis not present

## 2022-01-23 DIAGNOSIS — G40909 Epilepsy, unspecified, not intractable, without status epilepticus: Secondary | ICD-10-CM | POA: Diagnosis not present

## 2022-01-23 DIAGNOSIS — Z8349 Family history of other endocrine, nutritional and metabolic diseases: Secondary | ICD-10-CM

## 2022-01-23 DIAGNOSIS — R109 Unspecified abdominal pain: Secondary | ICD-10-CM | POA: Diagnosis not present

## 2022-01-23 DIAGNOSIS — R2681 Unsteadiness on feet: Secondary | ICD-10-CM | POA: Diagnosis not present

## 2022-01-23 DIAGNOSIS — Z886 Allergy status to analgesic agent status: Secondary | ICD-10-CM

## 2022-01-23 DIAGNOSIS — I10 Essential (primary) hypertension: Secondary | ICD-10-CM | POA: Diagnosis not present

## 2022-01-23 DIAGNOSIS — K56600 Partial intestinal obstruction, unspecified as to cause: Principal | ICD-10-CM | POA: Diagnosis present

## 2022-01-23 DIAGNOSIS — E559 Vitamin D deficiency, unspecified: Secondary | ICD-10-CM | POA: Diagnosis present

## 2022-01-23 DIAGNOSIS — Z85820 Personal history of malignant melanoma of skin: Secondary | ICD-10-CM

## 2022-01-23 DIAGNOSIS — Z794 Long term (current) use of insulin: Secondary | ICD-10-CM | POA: Diagnosis not present

## 2022-01-23 DIAGNOSIS — K589 Irritable bowel syndrome without diarrhea: Secondary | ICD-10-CM | POA: Diagnosis present

## 2022-01-23 DIAGNOSIS — K56609 Unspecified intestinal obstruction, unspecified as to partial versus complete obstruction: Secondary | ICD-10-CM | POA: Diagnosis present

## 2022-01-23 DIAGNOSIS — E041 Nontoxic single thyroid nodule: Secondary | ICD-10-CM | POA: Diagnosis present

## 2022-01-23 DIAGNOSIS — R1084 Generalized abdominal pain: Secondary | ICD-10-CM | POA: Diagnosis not present

## 2022-01-23 DIAGNOSIS — Z853 Personal history of malignant neoplasm of breast: Secondary | ICD-10-CM

## 2022-01-23 DIAGNOSIS — Z96641 Presence of right artificial hip joint: Secondary | ICD-10-CM | POA: Diagnosis present

## 2022-01-23 DIAGNOSIS — Z833 Family history of diabetes mellitus: Secondary | ICD-10-CM

## 2022-01-23 DIAGNOSIS — F419 Anxiety disorder, unspecified: Secondary | ICD-10-CM | POA: Diagnosis present

## 2022-01-23 DIAGNOSIS — R112 Nausea with vomiting, unspecified: Secondary | ICD-10-CM | POA: Diagnosis not present

## 2022-01-23 DIAGNOSIS — Z4659 Encounter for fitting and adjustment of other gastrointestinal appliance and device: Secondary | ICD-10-CM | POA: Diagnosis not present

## 2022-01-23 DIAGNOSIS — E876 Hypokalemia: Secondary | ICD-10-CM | POA: Diagnosis present

## 2022-01-23 DIAGNOSIS — Z7401 Bed confinement status: Secondary | ICD-10-CM | POA: Diagnosis not present

## 2022-01-23 DIAGNOSIS — R1031 Right lower quadrant pain: Secondary | ICD-10-CM | POA: Diagnosis not present

## 2022-01-23 DIAGNOSIS — D649 Anemia, unspecified: Secondary | ICD-10-CM | POA: Diagnosis not present

## 2022-01-23 DIAGNOSIS — J45909 Unspecified asthma, uncomplicated: Secondary | ICD-10-CM | POA: Diagnosis present

## 2022-01-23 DIAGNOSIS — J42 Unspecified chronic bronchitis: Secondary | ICD-10-CM | POA: Diagnosis present

## 2022-01-23 DIAGNOSIS — K56699 Other intestinal obstruction unspecified as to partial versus complete obstruction: Secondary | ICD-10-CM | POA: Diagnosis not present

## 2022-01-23 DIAGNOSIS — M545 Low back pain, unspecified: Secondary | ICD-10-CM | POA: Diagnosis not present

## 2022-01-23 DIAGNOSIS — M7989 Other specified soft tissue disorders: Secondary | ICD-10-CM | POA: Diagnosis not present

## 2022-01-23 DIAGNOSIS — Z9109 Other allergy status, other than to drugs and biological substances: Secondary | ICD-10-CM

## 2022-01-23 DIAGNOSIS — R55 Syncope and collapse: Secondary | ICD-10-CM | POA: Diagnosis not present

## 2022-01-23 DIAGNOSIS — Z86011 Personal history of benign neoplasm of the brain: Secondary | ICD-10-CM

## 2022-01-23 DIAGNOSIS — Z9181 History of falling: Secondary | ICD-10-CM | POA: Diagnosis not present

## 2022-01-23 DIAGNOSIS — E1165 Type 2 diabetes mellitus with hyperglycemia: Secondary | ICD-10-CM | POA: Diagnosis not present

## 2022-01-23 DIAGNOSIS — R600 Localized edema: Secondary | ICD-10-CM | POA: Diagnosis present

## 2022-01-23 DIAGNOSIS — Z79890 Hormone replacement therapy (postmenopausal): Secondary | ICD-10-CM

## 2022-01-23 DIAGNOSIS — Z882 Allergy status to sulfonamides status: Secondary | ICD-10-CM

## 2022-01-23 DIAGNOSIS — I5189 Other ill-defined heart diseases: Secondary | ICD-10-CM | POA: Diagnosis not present

## 2022-01-23 DIAGNOSIS — R2689 Other abnormalities of gait and mobility: Secondary | ICD-10-CM | POA: Diagnosis not present

## 2022-01-23 DIAGNOSIS — Z880 Allergy status to penicillin: Secondary | ICD-10-CM

## 2022-01-23 DIAGNOSIS — M2042 Other hammer toe(s) (acquired), left foot: Secondary | ICD-10-CM | POA: Diagnosis present

## 2022-01-23 DIAGNOSIS — M810 Age-related osteoporosis without current pathological fracture: Secondary | ICD-10-CM | POA: Diagnosis present

## 2022-01-23 DIAGNOSIS — Z8262 Family history of osteoporosis: Secondary | ICD-10-CM | POA: Diagnosis not present

## 2022-01-23 DIAGNOSIS — Z85038 Personal history of other malignant neoplasm of large intestine: Secondary | ICD-10-CM | POA: Diagnosis not present

## 2022-01-23 DIAGNOSIS — R531 Weakness: Secondary | ICD-10-CM | POA: Diagnosis not present

## 2022-01-23 DIAGNOSIS — R519 Headache, unspecified: Secondary | ICD-10-CM | POA: Diagnosis present

## 2022-01-23 DIAGNOSIS — C182 Malignant neoplasm of ascending colon: Secondary | ICD-10-CM | POA: Diagnosis not present

## 2022-01-23 DIAGNOSIS — Z881 Allergy status to other antibiotic agents status: Secondary | ICD-10-CM

## 2022-01-23 DIAGNOSIS — G40919 Epilepsy, unspecified, intractable, without status epilepticus: Secondary | ICD-10-CM | POA: Diagnosis not present

## 2022-01-23 DIAGNOSIS — M1990 Unspecified osteoarthritis, unspecified site: Secondary | ICD-10-CM | POA: Diagnosis present

## 2022-01-23 DIAGNOSIS — Z923 Personal history of irradiation: Secondary | ICD-10-CM

## 2022-01-23 DIAGNOSIS — I2510 Atherosclerotic heart disease of native coronary artery without angina pectoris: Secondary | ICD-10-CM | POA: Diagnosis not present

## 2022-01-23 DIAGNOSIS — Z91041 Radiographic dye allergy status: Secondary | ICD-10-CM

## 2022-01-23 DIAGNOSIS — E118 Type 2 diabetes mellitus with unspecified complications: Secondary | ICD-10-CM | POA: Diagnosis not present

## 2022-01-23 DIAGNOSIS — D72829 Elevated white blood cell count, unspecified: Secondary | ICD-10-CM | POA: Diagnosis present

## 2022-01-23 DIAGNOSIS — G250 Essential tremor: Secondary | ICD-10-CM | POA: Diagnosis present

## 2022-01-23 DIAGNOSIS — Z800 Family history of malignant neoplasm of digestive organs: Secondary | ICD-10-CM | POA: Diagnosis not present

## 2022-01-23 DIAGNOSIS — Z8249 Family history of ischemic heart disease and other diseases of the circulatory system: Secondary | ICD-10-CM | POA: Diagnosis not present

## 2022-01-23 DIAGNOSIS — E785 Hyperlipidemia, unspecified: Secondary | ICD-10-CM | POA: Diagnosis not present

## 2022-01-23 DIAGNOSIS — M549 Dorsalgia, unspecified: Secondary | ICD-10-CM | POA: Diagnosis not present

## 2022-01-23 DIAGNOSIS — Z9049 Acquired absence of other specified parts of digestive tract: Secondary | ICD-10-CM

## 2022-01-23 DIAGNOSIS — E119 Type 2 diabetes mellitus without complications: Secondary | ICD-10-CM | POA: Diagnosis not present

## 2022-01-23 DIAGNOSIS — E538 Deficiency of other specified B group vitamins: Secondary | ICD-10-CM | POA: Diagnosis present

## 2022-01-23 DIAGNOSIS — Z79899 Other long term (current) drug therapy: Secondary | ICD-10-CM

## 2022-01-23 DIAGNOSIS — G8929 Other chronic pain: Secondary | ICD-10-CM | POA: Diagnosis present

## 2022-01-23 DIAGNOSIS — M6281 Muscle weakness (generalized): Secondary | ICD-10-CM | POA: Diagnosis not present

## 2022-01-23 DIAGNOSIS — E039 Hypothyroidism, unspecified: Secondary | ICD-10-CM | POA: Diagnosis present

## 2022-01-23 DIAGNOSIS — Z90710 Acquired absence of both cervix and uterus: Secondary | ICD-10-CM

## 2022-01-23 DIAGNOSIS — Z86010 Personal history of colonic polyps: Secondary | ICD-10-CM

## 2022-01-23 DIAGNOSIS — K5669 Other partial intestinal obstruction: Secondary | ICD-10-CM | POA: Diagnosis not present

## 2022-01-23 DIAGNOSIS — K219 Gastro-esophageal reflux disease without esophagitis: Secondary | ICD-10-CM | POA: Diagnosis present

## 2022-01-23 DIAGNOSIS — D62 Acute posthemorrhagic anemia: Secondary | ICD-10-CM | POA: Diagnosis not present

## 2022-01-23 DIAGNOSIS — R41841 Cognitive communication deficit: Secondary | ICD-10-CM | POA: Diagnosis not present

## 2022-01-23 DIAGNOSIS — E222 Syndrome of inappropriate secretion of antidiuretic hormone: Secondary | ICD-10-CM | POA: Diagnosis present

## 2022-01-23 DIAGNOSIS — M2041 Other hammer toe(s) (acquired), right foot: Secondary | ICD-10-CM | POA: Diagnosis present

## 2022-01-23 DIAGNOSIS — F411 Generalized anxiety disorder: Secondary | ICD-10-CM | POA: Diagnosis not present

## 2022-01-23 DIAGNOSIS — T420X5D Adverse effect of hydantoin derivatives, subsequent encounter: Secondary | ICD-10-CM | POA: Diagnosis not present

## 2022-01-23 DIAGNOSIS — Z885 Allergy status to narcotic agent status: Secondary | ICD-10-CM

## 2022-01-23 DIAGNOSIS — K6389 Other specified diseases of intestine: Secondary | ICD-10-CM | POA: Diagnosis not present

## 2022-01-23 DIAGNOSIS — Z888 Allergy status to other drugs, medicaments and biological substances status: Secondary | ICD-10-CM

## 2022-01-23 LAB — COMPREHENSIVE METABOLIC PANEL
ALT: 29 U/L (ref 0–44)
AST: 26 U/L (ref 15–41)
Albumin: 3.4 g/dL — ABNORMAL LOW (ref 3.5–5.0)
Alkaline Phosphatase: 95 U/L (ref 38–126)
Anion gap: 11 (ref 5–15)
BUN: 11 mg/dL (ref 8–23)
CO2: 19 mmol/L — ABNORMAL LOW (ref 22–32)
Calcium: 8.9 mg/dL (ref 8.9–10.3)
Chloride: 99 mmol/L (ref 98–111)
Creatinine, Ser: 0.69 mg/dL (ref 0.44–1.00)
GFR, Estimated: >60 mL/min (ref >60)
Glucose, Bld: 231 mg/dL — ABNORMAL HIGH (ref 70–99)
Potassium: 3.9 mmol/L (ref 3.5–5.1)
Sodium: 129 mmol/L — ABNORMAL LOW (ref 135–145)
Total Bilirubin: 0.4 mg/dL (ref 0.3–1.2)
Total Protein: 6.4 g/dL — ABNORMAL LOW (ref 6.5–8.1)

## 2022-01-23 LAB — CBC WITH DIFFERENTIAL/PLATELET
Abs Immature Granulocytes: 0.07 10*3/uL (ref 0.00–0.07)
Basophils Absolute: 0 10*3/uL (ref 0.0–0.1)
Basophils Relative: 0 %
Eosinophils Absolute: 0.1 10*3/uL (ref 0.0–0.5)
Eosinophils Relative: 0 %
HCT: 37.2 % (ref 36.0–46.0)
Hemoglobin: 12.8 g/dL (ref 12.0–15.0)
Immature Granulocytes: 1 %
Lymphocytes Relative: 12 %
Lymphs Abs: 1.7 10*3/uL (ref 0.7–4.0)
MCH: 31.3 pg (ref 26.0–34.0)
MCHC: 34.4 g/dL (ref 30.0–36.0)
MCV: 91 fL (ref 80.0–100.0)
Monocytes Absolute: 1 10*3/uL (ref 0.1–1.0)
Monocytes Relative: 6 %
Neutro Abs: 12.1 10*3/uL — ABNORMAL HIGH (ref 1.7–7.7)
Neutrophils Relative %: 81 %
Platelets: 362 10*3/uL (ref 150–400)
RBC: 4.09 MIL/uL (ref 3.87–5.11)
RDW: 13.7 % (ref 11.5–15.5)
WBC: 14.9 10*3/uL — ABNORMAL HIGH (ref 4.0–10.5)
nRBC: 0 % (ref 0.0–0.2)

## 2022-01-23 LAB — LIPASE, BLOOD: Lipase: 44 U/L (ref 11–51)

## 2022-01-23 MED ORDER — ONDANSETRON HCL 4 MG/2ML IJ SOLN
4.0000 mg | Freq: Once | INTRAMUSCULAR | Status: AC | PRN
  Administered 2022-01-23: 4 mg via INTRAVENOUS
  Filled 2022-01-23: qty 2

## 2022-01-23 MED ORDER — SODIUM CHLORIDE 0.9 % IV BOLUS
500.0000 mL | Freq: Once | INTRAVENOUS | Status: AC
  Administered 2022-01-24: 500 mL via INTRAVENOUS

## 2022-01-23 MED ORDER — FENTANYL CITRATE PF 50 MCG/ML IJ SOSY
50.0000 ug | PREFILLED_SYRINGE | INTRAMUSCULAR | Status: DC | PRN
  Administered 2022-01-23: 50 ug via INTRAVENOUS
  Filled 2022-01-23: qty 1

## 2022-01-23 NOTE — ED Triage Notes (Signed)
Pt bib [facility] due to abdominal pain since 12pm after eating lunch at [employer]. Pt had a BM at 8pm, pt is unsure what it looked like. Pain in the lower right and left abdomen. Pt Complains of lower back pain as well. Abdomen soft non distended.Denies vomiting Pt is nauseated  161/85 79HR 16RR 95% on room air CBG 200 10/10 pain

## 2022-01-23 NOTE — ED Provider Notes (Signed)
[facility] EMERGENCY DEPARTMENT Provider Note   CSN: [id number] Arrival date & time: 01/23/22  2222     History {Add pertinent medical, surgical, social history, OB history to HPI:1} Chief Complaint  Patient presents with   Abdominal Pain    [name] is a 88 y.o. female with a hx of hypertension, hyperlipidemia, hypothyroidism, IBS, and prior hysterectomy, cholecystectomy, and hernia repair who presents to the ED with complaints of abdominal pain that began around 1430 this afternoon.  Patient reports the pain is to the generalized abdomen but most prominent in the right lower quadrant, also having some pain in her lower back.  Pain is constant, no alleviating or aggravating factors, said associated nausea with some vomiting but mostly dry heaving.  She has had a bowel movement since the pain started, this was around 20:00-she is unsure if this was diarrhea or could have blood because she did not look at it.  She denies fever, hematemesis, or dysuria.  HPI     Home Medications Prior to Admission medications   Medication Sig Start Date End Date Taking? Authorizing Provider  acetaminophen (TYLENOL) 500 MG tablet Take 2 tablets (1,000 mg total) by mouth 3 (three) times daily. Patient taking differently: Take 1,000 mg by mouth every 8 (eight) hours as needed for moderate pain. 08/21/20   Geradine Girt, DO  ALPRAZolam Duanne Moron) 0.25 MG tablet Take 1 tablet (0.25 mg total) by mouth 2 (two) times daily as needed for anxiety. 12/30/21   Penumalli, Earlean Polka, MD  benzonatate (TESSALON) 200 MG capsule Take 1 capsule (200 mg total) by mouth 2 (two) times daily as needed for cough. 08/21/20   Geradine Girt, DO  Biotin 5000 MCG CAPS Take 5,000 mcg by mouth 2 (two) times daily.    [provider]  Blood Glucose Monitoring Suppl (PRODIGY VOICE BLOOD GLUCOSE) w/Device KIT Use to check blood sugar 1 time per day. 10/18/15   Renato Shin, MD  capsicum (ZOSTRIX) 0.075 % topical  cream Apply 1 Application topically 4 (four) times daily as needed (pain). 08/04/21   [provider]  Cholecalciferol (VITAMIN D-3) 125 MCG (5000 UT) TABS Take 5,000 Units by mouth daily.    [provider]  cholestyramine (QUESTRAN) 4 g packet Take 4 g by mouth 2 (two) times daily.    [provider]  cyanocobalamin (,VITAMIN B-12,) 1000 MCG/ML injection Inject 1,000 mcg into the muscle every 21 ( twenty-one) days.    [provider]  diclofenac Sodium (VOLTAREN) 1 % GEL Apply 2 g topically 4 (four) times daily as needed (for knee and back pain). 06/01/19   [provider]  diphenhydrAMINE (BENADRYL) 25 mg capsule Take 25 mg by mouth every 8 (eight) hours as needed for allergies or sleep.    [provider]  feeding supplement, GLUCERNA SHAKE, (GLUCERNA SHAKE) LIQD Take 237 mLs by mouth daily as needed (for poor appetite).    [provider]  glucose blood (GLUCOSE METER TEST) test strip See admin instructions.    [provider]  Glucose Blood (PRODIGY VOICE BLOOD GLUCOSE VI)     [provider]  insulin aspart (NOVOLOG) 100 UNIT/ML FlexPen Inject 2-12 Units into the skin See admin instructions. Injects 2-10 units of insulin under the skin 3 times a day per sliding scale: CBG 0-150: 2 units; CBG 151-200: 4 units; CBG 201-250: 6 units; CBG 251-300: 8 units; CBG 301-350: 10 units; CBG 351-400: 12 units; CBG >400: 12  units and notify the MD/NP Patient taking differently: Inject 2-12 Units into the skin See admin instructions. Inject 2-10 units into the skin 3 times a day per sliding scale: CBG 0-150: 2 units; CBG 151-200: 4 units; CBG 201-250: 6 units; CBG 251-300: 8 units; CBG 301-350: 10 units; CBG 351-400: 12 units; CBG >400: 12 units and notify the MD/NP 07/08/20   Cleophas Dunker, Amy E, NP  insulin glargine (LANTUS SOLOSTAR) 100 UNIT/ML Solostar Pen Inject 10 Units into the skin at bedtime. Patient taking differently: Inject 12 Units  into the skin See admin instructions. Inject 12 units into the skin every afternoon- when eating 08/21/20   Eulogio Bear U, DO  Insulin Pen Needle 32G X 4 MM MISC Used to inject insulin 3x daily 11/19/16   Renato Shin, MD  levothyroxine (SYNTHROID) 75 MCG tablet Take 1 tablet (75 mcg total) by mouth daily before breakfast. 07/08/20   Fargo, Amy E, NP  pantoprazole (PROTONIX) 40 MG tablet Take 40 mg by mouth daily. 09/22/21   [provider]  phenytoin (DILANTIN) 300 MG ER capsule Take 1 capsule (300 mg total) by mouth at bedtime. 01/12/22   Alric Ran, MD  phenytoin (DILANTIN) 300 MG ER capsule Take 1 capsule (300 mg total) by mouth at bedtime. 01/22/22   Alric Ran, MD  phenytoin (DILANTIN) 50 MG tablet Chew 1 tablet (50 mg total) by mouth daily. 01/13/22   Alric Ran, MD  Polyethyl Glycol-Propyl Glycol (SYSTANE) 0.4-0.3 % SOLN Place 2 drops into both eyes 3 (three) times daily.    [provider]  polyethylene glycol (MIRALAX / GLYCOLAX) 17 g packet Take 17 g by mouth daily as needed for mild constipation.    [provider]  primidone (MYSOLINE) 250 MG tablet Take 250 mg by mouth at bedtime. 09/30/21   [provider]  thiamine 250 MG tablet Take 250 mg by mouth daily.    [provider]  traMADol (ULTRAM) 50 MG tablet Take 50 mg by mouth every 8 (eight) hours as needed for moderate pain.    [provider]  trandolapril (MAVIK) 2 MG tablet Take 2 mg by mouth daily.    [provider]  Verapamil HCl CR 200 MG CP24 Take 200 mg by mouth daily. 10/19/20   [provider]      Allergies    Bystolic [nebivolol hcl], Carbamazepine, Cholestyramine, Morphine, Niacin, Niaspan [niacin er], Norvasc [amlodipine besylate], Optivar [azelastine hcl], Repaglinide, Sular [nisoldipine er], Telmisartan, Amoxicillin-pot clavulanate, Cefdinir, Ciprofloxacin hcl, Clonidine, Clonidine hydrochloride, Codeine, Ezetimibe, Hydroxychloroquine,  Naproxen, Sulfa antibiotics, Ziac [bisoprolol-hydrochlorothiazide], Amlodipine besy-benazepril hcl, Azelastine, Elavil [amitriptyline], Empagliflozin, Hydralazine, Iodine i 131 tositumomab, Kenalog [triamcinolone], Keppra [levetiracetam], Lamotrigine, Misc. sulfonamide containing compounds, Pregabalin, Pseudoephedrine, Pseudoephedrine-guaifenesin er, Risedronate, Ru-hist d [brompheniramine-phenylephrine], Sumatriptan, Topiramate, Ace inhibitors, Actonel [risedronate sodium], Aspirin, Atacand [candesartan], Bextra [valdecoxib], Bisoprolol-hydrochlorothiazide, Cefadroxil, Celecoxib, Hydrocodone, Hydrocodone-acetaminophen, Iodinated contrast media, Meloxicam, Methylprednisolone sodium succinate, Nabumetone, Penicillins, Pseudoephedrine-guaifenesin, Risedronate sodium, Rofecoxib, Ru-tuss [chlorphen-pse-atrop-hyos-scop], Sulfonamide derivatives, Sulphur [elemental sulfur], Telithromycin, Terfenadine, Trandolapril-verapamil hcl er, Trandolapril-verapamil hcl er, and Valium [diazepam]    Review of Systems   Review of Systems  Constitutional:  Negative for fever.  Respiratory:  Negative for shortness of breath.   Cardiovascular:  Negative for chest pain.  Gastrointestinal:  Positive for abdominal pain, nausea and vomiting.  Genitourinary:  Negative for dysuria.  Musculoskeletal:  Positive for back pain.  Neurological:  Negative for syncope.  All other systems reviewed and are negative.   Physical Exam Updated Vital Signs BP (!) 172/89 (BP  Location: Right Arm)   Pulse 80   Temp 98.1 F (36.7 C) (Oral)   Resp 16   Ht 5' 7.5" (1.715 m)   Wt 65.3 kg   SpO2 93%   BMI 22.22 kg/m  Physical Exam Vitals and nursing note reviewed.  Constitutional:      General: She is in acute distress.     Appearance: She is well-developed. She is not toxic-appearing.  HENT:     Head: Normocephalic and atraumatic.  Eyes:     General:        Right eye: No discharge.        Left eye: No discharge.      Conjunctiva/sclera: Conjunctivae normal.  Cardiovascular:     Rate and Rhythm: Normal rate and regular rhythm.  Pulmonary:     Effort: No respiratory distress.     Breath sounds: Normal breath sounds. No wheezing or rales.  Abdominal:     General: There is distension.     Tenderness: There is generalized abdominal tenderness (most prominent in the RLQ). There is guarding.     Hernia: A hernia (no overlying skin changes) is present.  Musculoskeletal:     Cervical back: Neck supple.  Skin:    General: Skin is warm and dry.  Neurological:     Mental Status: She is alert.     Comments: Clear speech.   Psychiatric:        Behavior: Behavior normal.     ED Results / Procedures / Treatments   Labs (all labs ordered are listed, but only abnormal results are displayed) Labs Reviewed  CBC WITH DIFFERENTIAL/PLATELET - Abnormal; Notable for the following components:      Result Value   WBC 14.9 (*)    Neutro Abs 12.1 (*)    All other components within normal limits  LIPASE, BLOOD  COMPREHENSIVE METABOLIC PANEL  URINALYSIS, ROUTINE W REFLEX MICROSCOPIC    EKG None  Radiology No results found.  Procedures Procedures  {Document cardiac monitor, telemetry assessment procedure when appropriate:1}  Medications Ordered in ED Medications  fentaNYL (SUBLIMAZE) injection 50 mcg (50 mcg Intravenous Given 01/23/22 2314)  ondansetron (ZOFRAN) injection 4 mg (4 mg Intravenous Given 01/23/22 2313)    ED Course/ Medical Decision Making/ A&P                           Medical Decision Making Amount and/or Complexity of Data Reviewed Labs: ordered. Radiology: ordered.  Risk Prescription drug management.   Patient presents to the ED with complaints of abdominal pain. Patient nontoxic appearing, vitals w/ HTN. On exam patient tender to the generalized abdomen, more so to RLQ. Guarding. Distended.    Ddx including but not limited to: ***  Additional history obtained:  Additional  history obtained from chart review & nursing note review.  External records reviewed including last Echo w/ EF 60-65%.   Lab Tests:  I Ordered, viewed, and interpreted labs, which included:  CBC: Leukocytosis  CMP: *** Lipase: *** UA: *** Preg test: ***  Imaging Studies ordered:  I ordered imaging studies which included ***, I independently reviewed, formal radiology impression shows:  ***   Portions of this note were generated with Dragon dictation software. Dictation errors [place] occur despite best attempts at proofreading.     {Document critical care time when appropriate:1} {Document review of labs and clinical decision tools ie heart score, Chads2Vasc2 etc:1}  {Document your independent review of radiology images,  and any outside records:1} {Document your discussion with family members, caretakers, and with consultants:1} {Document social determinants of health affecting pt's care:1} {Document your decision making why or why not admission, treatments were needed:1} Final Clinical Impression(s) / ED Diagnoses Final diagnoses:  None    Rx / DC Orders ED Discharge Orders     None

## 2022-01-24 ENCOUNTER — Encounter (HOSPITAL_COMMUNITY): Payer: Self-pay | Admitting: Family Medicine

## 2022-01-24 ENCOUNTER — Inpatient Hospital Stay (HOSPITAL_COMMUNITY): Payer: Medicare Other

## 2022-01-24 ENCOUNTER — Inpatient Hospital Stay (HOSPITAL_BASED_OUTPATIENT_CLINIC_OR_DEPARTMENT_OTHER): Payer: Medicare Other

## 2022-01-24 DIAGNOSIS — G250 Essential tremor: Secondary | ICD-10-CM | POA: Diagnosis present

## 2022-01-24 DIAGNOSIS — Z923 Personal history of irradiation: Secondary | ICD-10-CM | POA: Diagnosis not present

## 2022-01-24 DIAGNOSIS — K56609 Unspecified intestinal obstruction, unspecified as to partial versus complete obstruction: Secondary | ICD-10-CM | POA: Diagnosis present

## 2022-01-24 DIAGNOSIS — I5189 Other ill-defined heart diseases: Secondary | ICD-10-CM | POA: Diagnosis not present

## 2022-01-24 DIAGNOSIS — E222 Syndrome of inappropriate secretion of antidiuretic hormone: Secondary | ICD-10-CM | POA: Diagnosis present

## 2022-01-24 DIAGNOSIS — M7989 Other specified soft tissue disorders: Secondary | ICD-10-CM | POA: Diagnosis not present

## 2022-01-24 DIAGNOSIS — E118 Type 2 diabetes mellitus with unspecified complications: Secondary | ICD-10-CM | POA: Diagnosis not present

## 2022-01-24 DIAGNOSIS — D649 Anemia, unspecified: Secondary | ICD-10-CM | POA: Diagnosis not present

## 2022-01-24 DIAGNOSIS — Z8262 Family history of osteoporosis: Secondary | ICD-10-CM | POA: Diagnosis not present

## 2022-01-24 DIAGNOSIS — F411 Generalized anxiety disorder: Secondary | ICD-10-CM | POA: Diagnosis not present

## 2022-01-24 DIAGNOSIS — K56600 Partial intestinal obstruction, unspecified as to cause: Secondary | ICD-10-CM | POA: Diagnosis present

## 2022-01-24 DIAGNOSIS — R1084 Generalized abdominal pain: Secondary | ICD-10-CM | POA: Diagnosis present

## 2022-01-24 DIAGNOSIS — G40919 Epilepsy, unspecified, intractable, without status epilepticus: Secondary | ICD-10-CM | POA: Diagnosis not present

## 2022-01-24 DIAGNOSIS — Z9181 History of falling: Secondary | ICD-10-CM | POA: Diagnosis not present

## 2022-01-24 DIAGNOSIS — E785 Hyperlipidemia, unspecified: Secondary | ICD-10-CM | POA: Diagnosis present

## 2022-01-24 DIAGNOSIS — K56699 Other intestinal obstruction unspecified as to partial versus complete obstruction: Secondary | ICD-10-CM | POA: Diagnosis not present

## 2022-01-24 DIAGNOSIS — J45909 Unspecified asthma, uncomplicated: Secondary | ICD-10-CM | POA: Diagnosis present

## 2022-01-24 DIAGNOSIS — G40909 Epilepsy, unspecified, not intractable, without status epilepticus: Secondary | ICD-10-CM

## 2022-01-24 DIAGNOSIS — K5669 Other partial intestinal obstruction: Secondary | ICD-10-CM | POA: Diagnosis not present

## 2022-01-24 DIAGNOSIS — E039 Hypothyroidism, unspecified: Secondary | ICD-10-CM | POA: Diagnosis present

## 2022-01-24 DIAGNOSIS — K6389 Other specified diseases of intestine: Secondary | ICD-10-CM | POA: Diagnosis not present

## 2022-01-24 DIAGNOSIS — Z794 Long term (current) use of insulin: Secondary | ICD-10-CM

## 2022-01-24 DIAGNOSIS — Z7401 Bed confinement status: Secondary | ICD-10-CM | POA: Diagnosis not present

## 2022-01-24 DIAGNOSIS — R2689 Other abnormalities of gait and mobility: Secondary | ICD-10-CM | POA: Diagnosis not present

## 2022-01-24 DIAGNOSIS — E1165 Type 2 diabetes mellitus with hyperglycemia: Secondary | ICD-10-CM

## 2022-01-24 DIAGNOSIS — J42 Unspecified chronic bronchitis: Secondary | ICD-10-CM | POA: Diagnosis present

## 2022-01-24 DIAGNOSIS — Z96641 Presence of right artificial hip joint: Secondary | ICD-10-CM | POA: Diagnosis present

## 2022-01-24 DIAGNOSIS — Z800 Family history of malignant neoplasm of digestive organs: Secondary | ICD-10-CM | POA: Diagnosis not present

## 2022-01-24 DIAGNOSIS — R41841 Cognitive communication deficit: Secondary | ICD-10-CM | POA: Diagnosis not present

## 2022-01-24 DIAGNOSIS — E041 Nontoxic single thyroid nodule: Secondary | ICD-10-CM | POA: Diagnosis present

## 2022-01-24 DIAGNOSIS — Z85820 Personal history of malignant melanoma of skin: Secondary | ICD-10-CM | POA: Diagnosis not present

## 2022-01-24 DIAGNOSIS — E119 Type 2 diabetes mellitus without complications: Secondary | ICD-10-CM | POA: Diagnosis not present

## 2022-01-24 DIAGNOSIS — R531 Weakness: Secondary | ICD-10-CM | POA: Diagnosis not present

## 2022-01-24 DIAGNOSIS — R1031 Right lower quadrant pain: Secondary | ICD-10-CM | POA: Diagnosis not present

## 2022-01-24 DIAGNOSIS — R55 Syncope and collapse: Secondary | ICD-10-CM | POA: Diagnosis not present

## 2022-01-24 DIAGNOSIS — I2510 Atherosclerotic heart disease of native coronary artery without angina pectoris: Secondary | ICD-10-CM | POA: Diagnosis not present

## 2022-01-24 DIAGNOSIS — D72829 Elevated white blood cell count, unspecified: Secondary | ICD-10-CM | POA: Diagnosis present

## 2022-01-24 DIAGNOSIS — Z853 Personal history of malignant neoplasm of breast: Secondary | ICD-10-CM | POA: Diagnosis not present

## 2022-01-24 DIAGNOSIS — I10 Essential (primary) hypertension: Secondary | ICD-10-CM | POA: Diagnosis present

## 2022-01-24 DIAGNOSIS — Z85038 Personal history of other malignant neoplasm of large intestine: Secondary | ICD-10-CM | POA: Diagnosis not present

## 2022-01-24 DIAGNOSIS — M6281 Muscle weakness (generalized): Secondary | ICD-10-CM | POA: Diagnosis not present

## 2022-01-24 DIAGNOSIS — Z4659 Encounter for fitting and adjustment of other gastrointestinal appliance and device: Secondary | ICD-10-CM | POA: Diagnosis not present

## 2022-01-24 DIAGNOSIS — S22009D Unspecified fracture of unspecified thoracic vertebra, subsequent encounter for fracture with routine healing: Secondary | ICD-10-CM | POA: Diagnosis not present

## 2022-01-24 DIAGNOSIS — M810 Age-related osteoporosis without current pathological fracture: Secondary | ICD-10-CM | POA: Diagnosis present

## 2022-01-24 DIAGNOSIS — T420X5D Adverse effect of hydantoin derivatives, subsequent encounter: Secondary | ICD-10-CM | POA: Diagnosis not present

## 2022-01-24 DIAGNOSIS — Z9049 Acquired absence of other specified parts of digestive tract: Secondary | ICD-10-CM | POA: Diagnosis not present

## 2022-01-24 DIAGNOSIS — C182 Malignant neoplasm of ascending colon: Secondary | ICD-10-CM | POA: Diagnosis not present

## 2022-01-24 DIAGNOSIS — D62 Acute posthemorrhagic anemia: Secondary | ICD-10-CM | POA: Diagnosis not present

## 2022-01-24 DIAGNOSIS — Z833 Family history of diabetes mellitus: Secondary | ICD-10-CM | POA: Diagnosis not present

## 2022-01-24 DIAGNOSIS — R2681 Unsteadiness on feet: Secondary | ICD-10-CM | POA: Diagnosis not present

## 2022-01-24 DIAGNOSIS — E876 Hypokalemia: Secondary | ICD-10-CM | POA: Diagnosis present

## 2022-01-24 DIAGNOSIS — Z8249 Family history of ischemic heart disease and other diseases of the circulatory system: Secondary | ICD-10-CM | POA: Diagnosis not present

## 2022-01-24 LAB — CBC
HCT: 38.3 % (ref 36.0–46.0)
Hemoglobin: 13 g/dL (ref 12.0–15.0)
MCH: 31.3 pg (ref 26.0–34.0)
MCHC: 33.9 g/dL (ref 30.0–36.0)
MCV: 92.3 fL (ref 80.0–100.0)
Platelets: 366 10*3/uL (ref 150–400)
RBC: 4.15 MIL/uL (ref 3.87–5.11)
RDW: 14.1 % (ref 11.5–15.5)
WBC: 16.1 10*3/uL — ABNORMAL HIGH (ref 4.0–10.5)
nRBC: 0 % (ref 0.0–0.2)

## 2022-01-24 LAB — BASIC METABOLIC PANEL
Anion gap: 10 (ref 5–15)
BUN: 10 mg/dL (ref 8–23)
CO2: 19 mmol/L — ABNORMAL LOW (ref 22–32)
Calcium: 8.4 mg/dL — ABNORMAL LOW (ref 8.9–10.3)
Chloride: 97 mmol/L — ABNORMAL LOW (ref 98–111)
Creatinine, Ser: 0.62 mg/dL (ref 0.44–1.00)
GFR, Estimated: >60 mL/min (ref >60)
Glucose, Bld: 299 mg/dL — ABNORMAL HIGH (ref 70–99)
Potassium: 4.7 mmol/L (ref 3.5–5.1)
Sodium: 126 mmol/L — ABNORMAL LOW (ref 135–145)

## 2022-01-24 LAB — GLUCOSE, CAPILLARY
Glucose-Capillary: 115 mg/dL — ABNORMAL HIGH (ref 70–99)
Glucose-Capillary: 125 mg/dL — ABNORMAL HIGH (ref 70–99)
Glucose-Capillary: 127 mg/dL — ABNORMAL HIGH (ref 70–99)
Glucose-Capillary: 135 mg/dL — ABNORMAL HIGH (ref 70–99)
Glucose-Capillary: 95 mg/dL (ref 70–99)

## 2022-01-24 LAB — MAGNESIUM: Magnesium: 1.6 mg/dL — ABNORMAL LOW (ref 1.7–2.4)

## 2022-01-24 LAB — HEMOGLOBIN A1C
Hgb A1c MFr Bld: 6.4 % — ABNORMAL HIGH (ref 4.8–5.6)
Mean Plasma Glucose: 136.98 mg/dL

## 2022-01-24 LAB — PHOSPHORUS: Phosphorus: 4.1 mg/dL (ref 2.5–4.6)

## 2022-01-24 LAB — CBG MONITORING, ED: Glucose-Capillary: 274 mg/dL — ABNORMAL HIGH (ref 70–99)

## 2022-01-24 MED ORDER — LORAZEPAM 2 MG/ML IJ SOLN
0.5000 mg | Freq: Once | INTRAMUSCULAR | Status: AC | PRN
  Administered 2022-01-24: 0.5 mg via INTRAVENOUS
  Filled 2022-01-24: qty 1

## 2022-01-24 MED ORDER — PANTOPRAZOLE SODIUM 40 MG IV SOLR
40.0000 mg | INTRAVENOUS | Status: DC
  Administered 2022-01-24 – 2022-01-25 (×2): 40 mg via INTRAVENOUS
  Filled 2022-01-24 (×2): qty 10

## 2022-01-24 MED ORDER — SODIUM CHLORIDE 0.9 % IV SOLN: 350.0000 mg | Freq: Every day | INTRAVENOUS | Status: DC

## 2022-01-24 MED ORDER — ONDANSETRON HCL 4 MG PO TABS: 4.0000 mg | ORAL_TABLET | Freq: Four times a day (QID) | ORAL | Status: DC | PRN

## 2022-01-24 MED ORDER — HYDROMORPHONE HCL 1 MG/ML IJ SOLN
0.5000 mg | Freq: Once | INTRAMUSCULAR | Status: AC
  Administered 2022-01-24: 0.5 mg via INTRAVENOUS
  Filled 2022-01-24: qty 1

## 2022-01-24 MED ORDER — FENTANYL CITRATE PF 50 MCG/ML IJ SOSY
25.0000 ug | PREFILLED_SYRINGE | INTRAMUSCULAR | Status: DC | PRN
  Administered 2022-01-24 – 2022-01-25 (×5): 50 ug via INTRAVENOUS
  Administered 2022-01-26: 25 ug via INTRAVENOUS
  Administered 2022-01-26 (×2): 50 ug via INTRAVENOUS
  Filled 2022-01-24 (×9): qty 1

## 2022-01-24 MED ORDER — ONDANSETRON HCL 4 MG/2ML IJ SOLN
4.0000 mg | Freq: Four times a day (QID) | INTRAMUSCULAR | Status: DC | PRN
  Administered 2022-01-24 – 2022-01-26 (×2): 4 mg via INTRAVENOUS
  Filled 2022-01-24 (×3): qty 2

## 2022-01-24 MED ORDER — SODIUM CHLORIDE 0.9 % IV SOLN
150.0000 mg | Freq: Every day | INTRAVENOUS | Status: DC
  Administered 2022-01-24 – 2022-01-25 (×2): 150 mg via INTRAVENOUS
  Filled 2022-01-24 (×7): qty 3

## 2022-01-24 MED ORDER — DIATRIZOATE MEGLUMINE & SODIUM 66-10 % PO SOLN
90.0000 mL | Freq: Once | ORAL | Status: AC
  Administered 2022-01-24: 90 mL via NASOGASTRIC
  Filled 2022-01-24: qty 90

## 2022-01-24 MED ORDER — INSULIN ASPART 100 UNIT/ML IJ SOLN
0.0000 [IU] | INTRAMUSCULAR | Status: DC
  Administered 2022-01-24 (×2): 3 [IU] via SUBCUTANEOUS
  Administered 2022-01-25 – 2022-01-26 (×4): 1 [IU] via SUBCUTANEOUS
  Administered 2022-01-26 – 2022-01-27 (×2): 2 [IU] via SUBCUTANEOUS
  Administered 2022-01-27 (×4): 1 [IU] via SUBCUTANEOUS
  Administered 2022-01-27 – 2022-01-28 (×2): 2 [IU] via SUBCUTANEOUS
  Administered 2022-01-28: 1 [IU] via SUBCUTANEOUS

## 2022-01-24 MED ORDER — SODIUM CHLORIDE 0.9 % IV SOLN: INTRAVENOUS | Status: AC

## 2022-01-24 MED ORDER — LORAZEPAM 2 MG/ML IJ SOLN
0.5000 mg | Freq: Four times a day (QID) | INTRAMUSCULAR | Status: DC | PRN
  Administered 2022-01-25: 0.5 mg via INTRAVENOUS
  Filled 2022-01-24: qty 1

## 2022-01-24 MED ORDER — POTASSIUM CHLORIDE 10 MEQ/100ML IV SOLN
10.0000 meq | Freq: Once | INTRAVENOUS | Status: AC
  Administered 2022-01-24: 10 meq via INTRAVENOUS
  Filled 2022-01-24: qty 100

## 2022-01-24 MED ORDER — MAGNESIUM SULFATE 2 GM/50ML IV SOLN
2.0000 g | Freq: Once | INTRAVENOUS | Status: AC
  Administered 2022-01-24: 2 g via INTRAVENOUS
  Filled 2022-01-24: qty 50

## 2022-01-24 MED ORDER — PHENYTOIN SODIUM 50 MG/ML IJ SOLN
100.0000 mg | INTRAMUSCULAR | Status: DC
  Administered 2022-01-24 – 2022-01-26 (×4): 100 mg via INTRAVENOUS
  Filled 2022-01-24 (×10): qty 2

## 2022-01-24 NOTE — Progress Notes (Signed)
Left lower extremity venous duplex has been completed. Preliminary results can be found in CV Proc through chart review.   01/24/22 11:28 AM Carlos Levering RVT

## 2022-01-24 NOTE — ED Notes (Signed)
Patient is yelling out and crying. Patient is attempting to pull NG tube out after being reminded not to do so. Patient continues to scream and sates "I am so miserable. I am horribly uncomfortable." Patient is sobbing at this time. MD Opyd notified of situation. Requesting MD for medication to help aide in patient's comfort.

## 2022-01-24 NOTE — H&P (Signed)
History and Physical    [name] IRJ:[id number] DOB: 04/09/1930 DOA: 01/23/2022  PCP: Bartholome Bill, MD   Patient coming from: Home   Chief Complaint: Abdominal pain, nausea   HPI: [name] is a pleasant 88 y.o. female with medical history significant for insulin-dependent diabetes mellitus, seizure disorder, colon cancer status post right hemicolectomy in 2021, and tremor presents emergency department with abdominal pain and nausea.  Patient reports that she had been in her usual state of health until yesterday afternoon when she developed pain across her mid and lower abdomen.  Pain became more severe and constant and was associated with severe nausea.  She reports having a bowel movement at 8 PM but continued to have severe pain and eventually came into the ED.  ED Course: Upon arrival to the ED, patient is found to be afebrile and saturating low 90s on room air with stable blood pressure.  Chemistry panel notable for glucose 231 and CBC features a leukocytosis to 14,900.  CT of the abdomen and pelvis is concerning for partial or developing SBO.  Surgery was consulted by the ED PA and the patient was treated with fentanyl, Dilaudid, Zofran, IV fluids, NG tube.  Review of Systems:  All other systems reviewed and apart from HPI, are negative.  Past Medical History:  Diagnosis Date   Anemia    Anxiety    Asthma    Breast cancer ([place]) 2014   right breast   Cancer of right breast ([place]) 12/26/12   right breast 12:00 o'clock, DCIS   Carotid artery disease (HCC)    Carpal tunnel syndrome, bilateral    Chronic bronchitis (HCC)    Chronic cough    Chronic facial pain    Chronic foot pain    Colon cancer ([place]) dx'd 07/158   Complication of anesthesia    Sore jaw; could not chew or move mouth, prolonged sedation   Convulsions/seizures ([place]) 10/16/2014   Diabetes mellitus    type 2 niddm x 20 years   Dyslipidemia    Ejection fraction    Gait abnormality 12/04/2019   GERD  (gastroesophageal reflux disease)    Hammer toe    bilateral   History of colonic polyps    History of meningioma    HTN (hypertension)    Hx of radiation therapy 03/07/13- 03/29/13   right breast 4250 cGy 17 sessions   Hyperlipidemia    Hypokalemia    Hyponatremia    Hypothyroidism    IBS (irritable bowel syndrome)    Melanoma ([place])    Metatarsal bone fracture 2014   Multiple drug allergies    Nontoxic thyroid nodule    Obesity    Osteoarthritis    Osteoporosis    Palpitations    Personal history of radiation therapy 2014   Seasonal allergies    Skin cancer    Syncope    Tremor, essential 08/18/2016   Vitamin B12 deficiency    Vitamin D deficiency     Past Surgical History:  Procedure Laterality Date   ABDOMINAL HYSTERECTOMY     BIOPSY  11/07/2019   Procedure: BIOPSY;  Surgeon: Ronald Lobo, MD;  Location: [facility];  Service: Endoscopy;;   BRAIN SURGERY     BREAST BIOPSY Right 01/24/2013   Procedure: RE-EXCICION OF BREAST CANCER, ANTERIOR MARGINS;  Surgeon: Edward Jolly, MD;  Location: [facility];  Service: General;  Laterality: Right;   BREAST LUMPECTOMY Right 2014   BREAST LUMPECTOMY WITH NEEDLE LOCALIZATION Right  01/17/2013   Procedure: BREAST LUMPECTOMY WITH NEEDLE LOCALIZATION;  Surgeon: Edward Jolly, MD;  Location: [place];  Service: General;  Laterality: Right;   BUNIONECTOMY Bilateral    CATARACT EXTRACTION W/ INTRAOCULAR LENS IMPLANT Right    CHOLECYSTECTOMY     COLONOSCOPY     COLONOSCOPY WITH PROPOFOL N/A 11/07/2019   Procedure: COLONOSCOPY WITH PROPOFOL;  Surgeon: Ronald Lobo, MD;  [facility]: [facility];  Service: Endoscopy;  Laterality: N/A;   CRANIOTOMY Right 10/18/2013   Procedure: CRANIOTOMY TUMOR EXCISION;  Surgeon: Floyce Stakes, MD;  [facility]: [facility];  Service: Neurosurgery;  Laterality: Right;   ESOPHAGOGASTRODUODENOSCOPY (EGD) WITH PROPOFOL N/A 11/07/2019   Procedure: ESOPHAGOGASTRODUODENOSCOPY (EGD) WITH PROPOFOL;  Surgeon:  Ronald Lobo, MD;  [facility]: [facility];  Service: Endoscopy;  Laterality: N/A;   EYE SURGERY     HERNIA REPAIR     IR THORACENTESIS ASP PLEURAL SPACE W/IMG GUIDE  04/01/2020   KNEE ARTHROSCOPY Bilateral    LAPAROSCOPIC RIGHT HEMI COLECTOMY Right 03/06/2020   Procedure: LAPAROSCOPIC RIGHT HEMI COLECTOMY WITH TAP BLOCK AND LYSIS OF ADHESIONS;  Surgeon: Ileana Roup, MD;  Location: [facility];  Service: General;  Laterality: Right;   POLYPECTOMY     small adenomatous   TOTAL HIP ARTHROPLASTY Right 06/05/2020   Procedure: ARTHROPLASTY  ANTERIOR APPROACH. RIGHT HIP;  Surgeon: Rod Can, MD;  Location: [facility];  Service: Orthopedics;  Laterality: Right;   ULNAR TUNNEL RELEASE      Social History:   reports that she has never smoked. She has never used smokeless tobacco. She reports that she does not drink alcohol and does not use drugs.  Allergies  Allergen Reactions   Bystolic [Nebivolol Hcl] Other (See Comments)    "extreme weakness, heaviness in legs & arms, swelling in legs/arms/face, swollen abdomen, pain in bladder, feet pain, soreness in chest"   Carbamazepine Other (See Comments)    Blood poisoning   Cholestyramine Itching, Nausea And Vomiting, Rash and Other (See Comments)    "itching rash on stomach, bloated, nausea, vomiting, sleeplessness, extreme pain in arms"  Patient is taking this in 2022.   Morphine Other (See Comments)    Feels morbid, weak, still in pain   Niacin Palpitations    Fast heart beat Other reaction(s): Unknown   Niaspan [Niacin Er] Palpitations and Other (See Comments)    "fast heart beat, high blood pressure"   Norvasc [Amlodipine Besylate] Other (See Comments)    "extreme fluid retention/pain)   Optivar [Azelastine Hcl] Photosensitivity   Repaglinide Hives   Sular [Nisoldipine Er] Other (See Comments)    "severe headaches, swelling eyes, hands, feet, shortness of breath, weak, flushed face, brain boiling, fluid retention, high blood sugar,  nervous, heart fast beating"   Telmisartan Other (See Comments)    "headache, difficulty urinating, high blood sugar, fluid retention"   Amoxicillin-Pot Clavulanate Rash    Tolerates Unasyn (>10 doses)   Cefdinir Swelling    Vaginal irritation, breathing,    Ciprofloxacin Hcl Hives   Clonidine Other (See Comments)    Dry mouth, fluid retention   Clonidine Hydrochloride Other (See Comments)    Dry mouth, fluid retention   Codeine Nausea And Vomiting   Ezetimibe Other (See Comments)    Made weak   Hydroxychloroquine Other (See Comments)    Low platlets   Naproxen Other (See Comments)    Shrinks bladder   Sulfa Antibiotics Rash   Ziac [Bisoprolol-Hydrochlorothiazide] Other (See Comments)    "stopped urination"  Amlodipine Besy-Benazepril Hcl Cough   Azelastine Other (See Comments)    Unknown reaction - Per MAR   Elavil [Amitriptyline] Other (See Comments)    Gave Pt nightmares   Empagliflozin Other (See Comments)    "Caused yeast infection, slowed my urine"   Hydralazine Other (See Comments)    "does not reduce high blood pressure, pain in arm, high pressure, felt like I was on verge of heart attack, really weak"   Iodine I 131 Tositumomab Other (See Comments)    Unknown reaction - Per MAR   Kenalog [Triamcinolone] Diarrhea    Per MAR   Keppra [Levetiracetam] Other (See Comments)    Shaking   Lamotrigine Itching and Other (See Comments)   Misc. Sulfonamide Containing Compounds    Pregabalin Swelling and Other (See Comments)    Weight gain   Pseudoephedrine Other (See Comments)    Unknown reaction   Pseudoephedrine-Guaifenesin Er Other (See Comments)    Unknown reaction - Per MAR   Risedronate     Unknown reaction - Per MAR   Ru-Hist D [Brompheniramine-Phenylephrine] Other (See Comments)    Unknown reaction - Per MAR   Sumatriptan Other (See Comments)   Topiramate Other (See Comments)    Dry eyes   Ace Inhibitors Other (See Comments)    unknown   Actonel  [Risedronate Sodium] Other (See Comments)    unknown   Aspirin Other (See Comments)    Unknown reaction   Atacand [Candesartan] Other (See Comments)    Unknown reaction - MAR   Bextra [Valdecoxib] Other (See Comments)    Unknown reaction - Per MAR   Bisoprolol-Hydrochlorothiazide Other (See Comments)    Unknown reaction - Per MAR   Cefadroxil Other (See Comments)    Unknown reaction   Celecoxib Rash   Hydrocodone Other (See Comments)    unknown   Hydrocodone-Acetaminophen Other (See Comments)    unknown   Iodinated Contrast Media Rash and Other (See Comments)    "All over" rash   Meloxicam Other (See Comments)    unknown   Methylprednisolone Sodium Succinate Other (See Comments)    unknown   Nabumetone Other (See Comments)    Unknown reaction   Penicillins Other (See Comments)    unknown   Pseudoephedrine-Guaifenesin Other (See Comments)    unknown   Risedronate Sodium Other (See Comments)    unknown   Rofecoxib Other (See Comments)    Unknown reaction - MAR Other reaction(s): Unknown   Ru-Tuss [Chlorphen-Pse-Atrop-Hyos-Scop] Other (See Comments)    unknown   Sulfonamide Derivatives Other (See Comments)    unknown   Sulphur [Elemental Sulfur] Other (See Comments)    unknown   Telithromycin Other (See Comments)    unknown   Terfenadine Other (See Comments)    Unknown reaction - Per MAR   Trandolapril-Verapamil Hcl Er Other (See Comments)    Headache, difficulty urinating, high blood sugar, fluid retention  Pt is taking Tarka (trandolapril-verapamil) currently, but requests the medication stay in her allergy list   Trandolapril-Verapamil Hcl Er Other (See Comments)    Headache, difficulty urinating, high blood sugar, fluid retention  Pt is taking Tarka (trandolapril-verapamil) currently, but requests the medication stay in her allergy list   Valium [Diazepam] Other (See Comments)    Makes her mean and hyper    Family History  Problem Relation Age of Onset    Heart disease Mother    Osteoporosis Mother    Diabetes Father    Pancreatic cancer Father  Bone cancer Sister    Rectal cancer Sister    Thyroid disease Sister        benign goiter resected   Prostate cancer Brother    Colon cancer Brother    Colon cancer Other      Prior to Admission medications   Medication Sig Start Date End Date Taking? Authorizing Provider  acetaminophen (TYLENOL) 500 MG tablet Take 2 tablets (1,000 mg total) by mouth 3 (three) times daily. Patient taking differently: Take 1,000 mg by mouth every 8 (eight) hours as needed for moderate pain. 08/21/20   Geradine Girt, DO  ALPRAZolam Duanne Moron) 0.25 MG tablet Take 1 tablet (0.25 mg total) by mouth 2 (two) times daily as needed for anxiety. 12/30/21   Penumalli, Earlean Polka, MD  benzonatate (TESSALON) 200 MG capsule Take 1 capsule (200 mg total) by mouth 2 (two) times daily as needed for cough. 08/21/20   Geradine Girt, DO  Biotin 5000 MCG CAPS Take 5,000 mcg by mouth 2 (two) times daily.    [provider]  Blood Glucose Monitoring Suppl (PRODIGY VOICE BLOOD GLUCOSE) w/Device KIT Use to check blood sugar 1 time per day. 10/18/15   Renato Shin, MD  capsicum (ZOSTRIX) 0.075 % topical cream Apply 1 Application topically 4 (four) times daily as needed (pain). 08/04/21   [provider]  Cholecalciferol (VITAMIN D-3) 125 MCG (5000 UT) TABS Take 5,000 Units by mouth daily.    [provider]  cholestyramine (QUESTRAN) 4 g packet Take 4 g by mouth 2 (two) times daily.    [provider]  cyanocobalamin (,VITAMIN B-12,) 1000 MCG/ML injection Inject 1,000 mcg into the muscle every 21 ( twenty-one) days.    [provider]  diclofenac Sodium (VOLTAREN) 1 % GEL Apply 2 g topically 4 (four) times daily as needed (for knee and back pain). 06/01/19   [provider]  diphenhydrAMINE (BENADRYL) 25 mg capsule Take 25 mg by mouth every 8 (eight) hours as needed for allergies or sleep.     [provider]  feeding supplement, GLUCERNA SHAKE, (GLUCERNA SHAKE) LIQD Take 237 mLs by mouth daily as needed (for poor appetite).    [provider]  glucose blood (GLUCOSE METER TEST) test strip See admin instructions.    [provider]  Glucose Blood (PRODIGY VOICE BLOOD GLUCOSE VI)     [provider]  insulin aspart (NOVOLOG) 100 UNIT/ML FlexPen Inject 2-12 Units into the skin See admin instructions. Injects 2-10 units of insulin under the skin 3 times a day per sliding scale: CBG 0-150: 2 units; CBG 151-200: 4 units; CBG 201-250: 6 units; CBG 251-300: 8 units; CBG 301-350: 10 units; CBG 351-400: 12 units; CBG >400: 12 units and notify the MD/NP Patient taking differently: Inject 2-12 Units into the skin See admin instructions. Inject 2-10 units into the skin 3 times a day per sliding scale: CBG 0-150: 2 units; CBG 151-200: 4 units; CBG 201-250: 6 units; CBG 251-300: 8 units; CBG 301-350: 10 units; CBG 351-400: 12 units; CBG >400: 12 units and notify the MD/NP 07/08/20   Cleophas Dunker, Amy E, NP  insulin glargine (LANTUS SOLOSTAR) 100 UNIT/ML Solostar Pen Inject 10 Units into the skin at bedtime. Patient taking differently: Inject 12 Units into the skin See admin instructions. Inject 12 units into the skin every afternoon- when eating 08/21/20   Eulogio Bear U, DO  Insulin Pen Needle 32G X 4 MM MISC Used to inject insulin 3x daily 11/19/16  Renato Shin, MD  levothyroxine (SYNTHROID) 75 MCG tablet Take 1 tablet (75 mcg total) by mouth daily before breakfast. 07/08/20   Fargo, Amy E, NP  pantoprazole (PROTONIX) 40 MG tablet Take 40 mg by mouth daily. 09/22/21   [provider]  phenytoin (DILANTIN) 300 MG ER capsule Take 1 capsule (300 mg total) by mouth at bedtime. 01/12/22   Alric Ran, MD  phenytoin (DILANTIN) 300 MG ER capsule Take 1 capsule (300 mg total) by mouth at bedtime. 01/22/22   Alric Ran, MD  phenytoin (DILANTIN) 50 MG tablet Chew 1 tablet  (50 mg total) by mouth daily. 01/13/22   Alric Ran, MD  Polyethyl Glycol-Propyl Glycol (SYSTANE) 0.4-0.3 % SOLN Place 2 drops into both eyes 3 (three) times daily.    [provider]  polyethylene glycol (MIRALAX / GLYCOLAX) 17 g packet Take 17 g by mouth daily as needed for mild constipation.    [provider]  primidone (MYSOLINE) 250 MG tablet Take 250 mg by mouth at bedtime. 09/30/21   [provider]  thiamine 250 MG tablet Take 250 mg by mouth daily.    [provider]  traMADol (ULTRAM) 50 MG tablet Take 50 mg by mouth every 8 (eight) hours as needed for moderate pain.    [provider]  trandolapril (MAVIK) 2 MG tablet Take 2 mg by mouth daily.    [provider]  Verapamil HCl CR 200 MG CP24 Take 200 mg by mouth daily. 10/19/20   [provider]    Physical Exam: Vitals:   01/24/22 0000 01/24/22 0015 01/24/22 0030 01/24/22 0230  BP: (!) 157/85  (!) 157/80 (!) 163/83  Pulse: 87 89 89 89  Resp:   18 20  Temp:      TempSrc:      SpO2: 96% 97% 97% 97%  Weight:      Height:        Constitutional: NAD, calm  Eyes: PERTLA, lids and conjunctivae normal ENMT: Mucous membranes are moist. Posterior pharynx clear of any exudate or lesions.   Neck: supple, no masses  Respiratory: no wheezing, no crackles. No accessory muscle use.  Cardiovascular: S1 & S2 heard, regular rate and rhythm. Left ankle swelling.   Abdomen: soft, tender in mid and lower abdomen without rebound pain or guarding. Rare high-pitched bowel sound.  Musculoskeletal: no clubbing / cyanosis. No joint deformity upper and lower extremities.   Skin: no significant rashes, lesions, ulcers. Warm, dry, well-perfused. Neurologic: CN 2-12 grossly intact. Moving all extremities. Alert and oriented.  Psychiatric: Pleasant. Cooperative.    Labs and Imaging on Admission: I have personally reviewed following labs and imaging studies  CBC: Recent Labs  Lab  01/23/22 2246  WBC 14.9*  NEUTROABS 12.1*  HGB 12.8  HCT 37.2  MCV 91.0  PLT 188   Basic Metabolic Panel: Recent Labs  Lab 01/23/22 2246  NA 129*  K 3.9  CL 99  CO2 19*  GLUCOSE 231*  BUN 11  CREATININE 0.69  CALCIUM 8.9   GFR: Estimated Creatinine Clearance: 47.3 mL/min (by C-G formula based on SCr of 0.69 mg/dL). Liver Function Tests: Recent Labs  Lab 01/23/22 2246  AST 26  ALT 29  ALKPHOS 95  BILITOT 0.4  PROT 6.4*  ALBUMIN 3.4*   Recent Labs  Lab 01/23/22 2246  LIPASE 44   No results for input(s): "AMMONIA" in the last 168 hours. Coagulation Profile: No results for input(s): "INR", "PROTIME" in the last 168  hours. Cardiac Enzymes: No results for input(s): "CKTOTAL", "CKMB", "CKMBINDEX", "TROPONINI" in the last 168 hours. BNP (last 3 results) No results for input(s): "PROBNP" in the last 8760 hours. HbA1C: No results for input(s): "HGBA1C" in the last 72 hours. CBG: No results for input(s): "GLUCAP" in the last 168 hours. Lipid Profile: No results for input(s): "CHOL", "HDL", "LDLCALC", "TRIG", "CHOLHDL", "LDLDIRECT" in the last 72 hours. Thyroid Function Tests: No results for input(s): "TSH", "T4TOTAL", "FREET4", "T3FREE", "THYROIDAB" in the last 72 hours. Anemia Panel: No results for input(s): "VITAMINB12", "FOLATE", "FERRITIN", "TIBC", "IRON", "RETICCTPCT" in the last 72 hours. Urine analysis:    Component Value Date/Time   COLORURINE YELLOW 12/21/2021 [place] 12/21/2021 1654   LABSPEC <1.005 (L) 12/21/2021 1654   PHURINE 6.5 12/21/2021 1654   GLUCOSEU NEGATIVE 12/21/2021 1654   GLUCOSEU NEGATIVE 12/28/2013 1036   HGBUR NEGATIVE 12/21/2021 1654   HGBUR trace-intact 06/29/2007 1304   BILIRUBINUR NEGATIVE 12/21/2021 1654   BILIRUBINUR negative 01/19/2017 1049   KETONESUR NEGATIVE 12/21/2021 1654   PROTEINUR NEGATIVE 12/21/2021 1654   UROBILINOGEN 0.2 01/19/2017 1049   UROBILINOGEN 1.0 03/25/2015 1755   NITRITE NEGATIVE  12/21/2021 1654   LEUKOCYTESUR TRACE (A) 12/21/2021 1654   Sepsis Labs: _0 (procalcitonin:4,lacticidven:4) )No results found for this or any previous visit (from the past 240 hour(s)).   Radiological Exams on Admission: DG Abdomen Acute W/Chest  Result Date: 01/23/2022 CLINICAL DATA:  Abdominal pain and weakness EXAM: DG ABDOMEN ACUTE WITH 1 VIEW CHEST COMPARISON:  CT abdomen and pelvis from earlier today and chest radiographs 12/21/2021 FINDINGS: Dilated loops of small bowel in the central abdomen. No radiopaque calculi or other significant radiographic abnormality is seen. Cholecystectomy clips. Low lung volumes. Bibasilar atelectasis/scarring. Chronic bilateral bronchitic changes. Stable heart size. No pleural effusion or pneumothorax. No acute osseous abnormality. Right THA. IMPRESSION: Gas-filled dilation of loops of small bowel in the central abdomen concerning for obstruction. Recommend correlation with same day CT abdomen and pelvis. Electronically Signed   By: Placido Sou M.D.   On: 01/23/2022 23:57     Assessment/Plan  1. SBO  - Pt with remote hx of hysterectomy and right hemicolectomy in 2021 presents with severe abdominal pain and nausea and has CT findings concerning for partial or developing SBO  - Surgery was consulted by ED PA, patient was treated with IVF, analgesics, and antiemetics in the ED, and NGT is being placed  - Continue bowel rest, NGT decompression, and IVF hydration, optimize electrolytes   2. Insulin-dependent DM  - A1c was 6.6% in December 2022  - Continue CBG checks and insulin   3. Seizure disorder  - Convert Dilantin to IV for now    4. Hypothyroidism  - Consider starting IV levothyroxine if still unable to take pills in 48-72 hrs   5. LLE edema  - Patient reports ~2 wks of atraumatic left foot and ankle swelling  - Check venous US    DVT prophylaxis: SCDs  Code Status: Full, discussed with patient on admission  Level of Care: Level of  care: Med-Surg Family Communication: None present  Disposition Plan:  Patient is from: home  Anticipated d/c is to: TBD  Anticipated d/c date is: 01/26/22  Patient currently: Pending return of bowel function   Consults called: Surgery  Admission status: Inpatient     Vianne Bulls, MD [facility]  01/24/2022, 3:35 AM

## 2022-01-24 NOTE — Progress Notes (Signed)
[facility] PLAN OF CARE NOTE Patient: [name] ZOX:[id number]   PCP: Bartholome Bill, MD DOB: 01-29-1930   DOA: 01/23/2022   DOS: 01/24/2022    Patient was admitted by my colleague earlier on 01/24/2022. I have reviewed the H&P as well as assessment and plan and agree with the same. Important changes in the plan are listed below.  Plan of care: Principal Problem:   SBO (small bowel obstruction) (HCC) Active Problems:   Uncontrolled type 2 diabetes mellitus with hyperglycemia, with long-term current use of insulin (HCC)   Seizure disorder ([place])   Acquired hypothyroidism General surgery consult appreciated. Monitor.  Author: Berle Mull, MD [facility] 01/24/2022 7:00 PM   If 7PM-7AM, please contact night-coverage at [email or web address]

## 2022-01-24 NOTE — Consult Note (Addendum)
CC/Reason for consult: Small bowel obstruction  Requesting PA: Amaryllis Dyke, PA-C  HPI: [name] is an 88 y.o. female with hx of HTN, DM, whom is known to me following OR 03/06/20 -  1. Laparoscopic right hemicolectomy 2. Laparoscopic lysis of adhesions x 120 minutes 3. Bilateral transversus abdominus plane blocks  A. COLON, RIGHT, RESECTION:  - Invasive moderately differentiated adenocarcinoma, 4.7 cm, involving  proximal ascending colon  - Carcinoma invades into pericolonic soft tissue  - Negative for lymphovascular or perineural invasion  - Resection margins are negative for carcinoma  - Twelve lymph nodes, negative for carcinoma (0/12)  - A separate tubular adenoma without high-grade dysplasia  - See oncology table   Have been somewhat lost to follow-up from at least her colon cancer perspective.  She had return to the ER yesterday with nausea and vomiting.  No evidence of gas/BM yet the last day or so she believes.   CT scan which demonstrated fluid-filled loops of small bowel throughout the abdomen extending to her ileocolic anastomosis without inspissated stool in the terminal small bowel.   NG placed in ED. She is not thrilled about NG but notes improvement in her symptoms. Not sure if she has had flatus since being here.   Past Medical History:  Diagnosis Date   Anemia    Anxiety    Asthma    Breast cancer ([facility]) 2014   right breast   Cancer of right breast ([facility]) 12/26/12   right breast 12:00 o'clock, DCIS   Carotid artery disease (HCC)    Carpal tunnel syndrome, bilateral    Chronic bronchitis (HCC)    Chronic cough    Chronic facial pain    Chronic foot pain    Colon cancer ([place]) dx'd 01/4173   Complication of anesthesia    Sore jaw; could not chew or move mouth, prolonged sedation   Convulsions/seizures ([facility] [place]) 10/16/2014   Diabetes mellitus    type 2 niddm x 20 years   Dyslipidemia    Ejection fraction    Gait abnormality 12/04/2019   GERD  (gastroesophageal reflux disease)    Hammer toe    bilateral   History of colonic polyps    History of meningioma    HTN (hypertension)    Hx of radiation therapy 03/07/13- 03/29/13   right breast 4250 cGy 17 sessions   Hyperlipidemia    Hypokalemia    Hyponatremia    Hypothyroidism    IBS (irritable bowel syndrome)    Melanoma ([facility])    Metatarsal bone fracture 2014   Multiple drug allergies    Nontoxic thyroid nodule    Obesity    Osteoarthritis    Osteoporosis    Palpitations    Personal history of radiation therapy 2014   Seasonal allergies    Skin cancer    Syncope    Tremor, essential 08/18/2016   Vitamin B12 deficiency    Vitamin D deficiency     Past Surgical History:  Procedure Laterality Date   ABDOMINAL HYSTERECTOMY     BIOPSY  11/07/2019   Procedure: BIOPSY;  Surgeon: Ronald Lobo, MD;  [facility];  Service: Endoscopy;;   BRAIN SURGERY     BREAST BIOPSY Right 01/24/2013   Procedure: RE-EXCICION OF BREAST CANCER, ANTERIOR MARGINS;  Surgeon: Edward Jolly, MD;  Location: [facility];  Service: General;  Laterality: Right;   BREAST LUMPECTOMY Right 2014   BREAST LUMPECTOMY WITH NEEDLE LOCALIZATION Right 01/17/2013   Procedure: BREAST  LUMPECTOMY WITH NEEDLE LOCALIZATION;  Surgeon: Edward Jolly, MD;  Location: [facility];  Service: General;  Laterality: Right;   BUNIONECTOMY Bilateral    CATARACT EXTRACTION W/ INTRAOCULAR LENS IMPLANT Right    CHOLECYSTECTOMY     COLONOSCOPY     COLONOSCOPY WITH PROPOFOL N/A 11/07/2019   Procedure: COLONOSCOPY WITH PROPOFOL;  Surgeon: Ronald Lobo, MD;  [facility];  Service: Endoscopy;  Laterality: N/A;   CRANIOTOMY Right 10/18/2013   Procedure: CRANIOTOMY TUMOR EXCISION;  Surgeon: Floyce Stakes, MD;  [facility];  Service: Neurosurgery;  Laterality: Right;   ESOPHAGOGASTRODUODENOSCOPY (EGD) WITH PROPOFOL N/A 11/07/2019   Procedure: ESOPHAGOGASTRODUODENOSCOPY (EGD) WITH PROPOFOL;  Surgeon:  Ronald Lobo, MD;  [facility];  Service: Endoscopy;  Laterality: N/A;   EYE SURGERY     HERNIA REPAIR     IR THORACENTESIS ASP PLEURAL SPACE W/IMG GUIDE  04/01/2020   KNEE ARTHROSCOPY Bilateral    LAPAROSCOPIC RIGHT HEMI COLECTOMY Right 03/06/2020   Procedure: LAPAROSCOPIC RIGHT HEMI COLECTOMY WITH TAP BLOCK AND LYSIS OF ADHESIONS;  Surgeon: Ileana Roup, MD;  [facility];  Service: General;  Laterality: Right;   POLYPECTOMY     small adenomatous   TOTAL HIP ARTHROPLASTY Right 06/05/2020   Procedure: ARTHROPLASTY  ANTERIOR APPROACH. RIGHT HIP;  Surgeon: Rod Can, MD;  [facility];  Service: Orthopedics;  Laterality: Right;   ULNAR TUNNEL RELEASE      Family History  Problem Relation Age of Onset   Heart disease Mother    Osteoporosis Mother    Diabetes Father    Pancreatic cancer Father    Bone cancer Sister    Rectal cancer Sister    Thyroid disease Sister        benign goiter resected   Prostate cancer Brother    Colon cancer Brother    Colon cancer Other     Social:  reports that she has never smoked. She has never used smokeless tobacco. She reports that she does not drink alcohol and does not use drugs.  Allergies:  Allergies  Allergen Reactions   Bystolic [Nebivolol Hcl] Other (See Comments)    "extreme weakness, heaviness in legs & arms, swelling in legs/arms/face, swollen abdomen, pain in bladder, feet pain, soreness in chest"   Carbamazepine Other (See Comments)    Blood poisoning   Cholestyramine Itching, Nausea And Vomiting, Rash and Other (See Comments)    "itching rash on stomach, bloated, nausea, vomiting, sleeplessness, extreme pain in arms"  Patient is taking this in 2022.   Morphine Other (See Comments)    Feels morbid, weak, still in pain   Niacin Palpitations    Fast heart beat Other reaction(s): Unknown   Niaspan [Niacin Er] Palpitations and Other (See Comments)    "fast heart beat, high blood pressure"   Norvasc  [Amlodipine Besylate] Other (See Comments)    "extreme fluid retention/pain)   Optivar [Azelastine Hcl] Photosensitivity   Repaglinide Hives   Sular [Nisoldipine Er] Other (See Comments)    "severe headaches, swelling eyes, hands, feet, shortness of breath, weak, flushed face, brain boiling, fluid retention, high blood sugar, nervous, heart fast beating"   Telmisartan Other (See Comments)    "headache, difficulty urinating, high blood sugar, fluid retention"   Amoxicillin-Pot Clavulanate Rash    Tolerates Unasyn (>10 doses)   Cefdinir Swelling    Vaginal irritation, breathing,    Ciprofloxacin Hcl Hives   Clonidine Other (See Comments)    Dry mouth, fluid  retention   Clonidine Hydrochloride Other (See Comments)    Dry mouth, fluid retention   Codeine Nausea And Vomiting   Ezetimibe Other (See Comments)    Made weak   Hydroxychloroquine Other (See Comments)    Low platlets   Naproxen Other (See Comments)    Shrinks bladder   Sulfa Antibiotics Rash   Ziac [Bisoprolol-Hydrochlorothiazide] Other (See Comments)    "stopped urination"   Amlodipine Besy-Benazepril Hcl Cough   Azelastine Other (See Comments)    Unknown reaction - Per MAR   Elavil [Amitriptyline] Other (See Comments)    Gave Pt nightmares   Empagliflozin Other (See Comments)    "Caused yeast infection, slowed my urine"   Hydralazine Other (See Comments)    "does not reduce high blood pressure, pain in arm, high pressure, felt like I was on verge of heart attack, really weak"   Iodine I 131 Tositumomab Other (See Comments)    Unknown reaction - Per MAR   Kenalog [Triamcinolone] Diarrhea    Per MAR   Keppra [Levetiracetam] Other (See Comments)    Shaking   Lamotrigine Itching and Other (See Comments)   Misc. Sulfonamide Containing Compounds    Pregabalin Swelling and Other (See Comments)    Weight gain   Pseudoephedrine Other (See Comments)    Unknown reaction   Pseudoephedrine-Guaifenesin Er Other (See Comments)     Unknown reaction - Per MAR   Risedronate     Unknown reaction - Per MAR   Ru-Hist D [Brompheniramine-Phenylephrine] Other (See Comments)    Unknown reaction - Per MAR   Sumatriptan Other (See Comments)   Topiramate Other (See Comments)    Dry eyes   Ace Inhibitors Other (See Comments)    unknown   Actonel [Risedronate Sodium] Other (See Comments)    unknown   Aspirin Other (See Comments)    Unknown reaction   Atacand [Candesartan] Other (See Comments)    Unknown reaction - MAR   Bextra [Valdecoxib] Other (See Comments)    Unknown reaction - Per MAR   Bisoprolol-Hydrochlorothiazide Other (See Comments)    Unknown reaction - Per MAR   Cefadroxil Other (See Comments)    Unknown reaction   Celecoxib Rash   Hydrocodone Other (See Comments)    unknown   Hydrocodone-Acetaminophen Other (See Comments)    unknown   Iodinated Contrast Media Rash and Other (See Comments)    "All over" rash   Meloxicam Other (See Comments)    unknown   Methylprednisolone Sodium Succinate Other (See Comments)    unknown   Nabumetone Other (See Comments)    Unknown reaction   Penicillins Other (See Comments)    unknown   Pseudoephedrine-Guaifenesin Other (See Comments)    unknown   Risedronate Sodium Other (See Comments)    unknown   Rofecoxib Other (See Comments)    Unknown reaction - MAR Other reaction(s): Unknown   Ru-Tuss [Chlorphen-Pse-Atrop-Hyos-Scop] Other (See Comments)    unknown   Sulfonamide Derivatives Other (See Comments)    unknown   Sulphur [Elemental Sulfur] Other (See Comments)    unknown   Telithromycin Other (See Comments)    unknown   Terfenadine Other (See Comments)    Unknown reaction - Per MAR   Trandolapril-Verapamil Hcl Er Other (See Comments)    Headache, difficulty urinating, high blood sugar, fluid retention  Pt is taking Tarka (trandolapril-verapamil) currently, but requests the medication stay in her allergy list   Trandolapril-Verapamil Hcl Er Other (See  Comments)  Headache, difficulty urinating, high blood sugar, fluid retention  Pt is taking Tarka (trandolapril-verapamil) currently, but requests the medication stay in her allergy list   Valium [Diazepam] Other (See Comments)    Makes her mean and hyper    Medications: I have reviewed the patient's current medications.  Results for orders placed or performed during the hospital encounter of 01/23/22 (from the past 48 hour(s))  Lipase, blood     Status: None   Collection Time: 01/23/22 10:46 PM  Result Value Ref Range   Lipase 44 11 - 51 U/L    Comment: Performed at [facility], [place] [address], [place], [place] [address]  Comprehensive metabolic panel     Status: Abnormal   Collection Time: 01/23/22 10:46 PM  Result Value Ref Range   Sodium 129 (L) 135 - 145 mmol/L   Potassium 3.9 3.5 - 5.1 mmol/L   Chloride 99 98 - 111 mmol/L   CO2 19 (L) 22 - 32 mmol/L   Glucose, Bld 231 (H) 70 - 99 mg/dL    Comment: Glucose reference range applies only to samples taken after fasting for at least 8 hours.   BUN 11 8 - 23 mg/dL   Creatinine, Ser 0.69 0.44 - 1.00 mg/dL   Calcium 8.9 8.9 - 10.3 mg/dL   Total Protein 6.4 (L) 6.5 - 8.1 g/dL   Albumin 3.4 (L) 3.5 - 5.0 g/dL   AST 26 15 - 41 U/L   ALT 29 0 - 44 U/L   Alkaline Phosphatase 95 38 - 126 U/L   Total Bilirubin 0.4 0.3 - 1.2 mg/dL   GFR, Estimated >60 >60 mL/min    Comment: (NOTE) Calculated using the CKD-EPI Creatinine Equation (2021)    Anion gap 11 5 - 15    Comment: Performed at [facility], [place] [address] [place], [place] [address]  CBC with Differential     Status: Abnormal   Collection Time: 01/23/22 10:46 PM  Result Value Ref Range   WBC 14.9 (H) 4.0 - 10.5 K/uL   RBC 4.09 3.87 - 5.11 MIL/uL   Hemoglobin 12.8 12.0 - 15.0 g/dL   HCT 37.2 36.0 - 46.0 %   MCV 91.0 80.0 - 100.0 fL   MCH 31.3 26.0 - 34.0 pg   MCHC 34.4 30.0 - 36.0 g/dL   RDW 13.7 11.5 - 15.5 %   Platelets 362 150 - 400 K/uL   nRBC 0.0 0.0 -  0.2 %   Neutrophils Relative % 81 %   Neutro Abs 12.1 (H) 1.7 - 7.7 K/uL   Lymphocytes Relative 12 %   Lymphs Abs 1.7 0.7 - 4.0 K/uL   Monocytes Relative 6 %   Monocytes Absolute 1.0 0.1 - 1.0 K/uL   Eosinophils Relative 0 %   Eosinophils Absolute 0.1 0.0 - 0.5 K/uL   Basophils Relative 0 %   Basophils Absolute 0.0 0.0 - 0.1 K/uL   Immature Granulocytes 1 %   Abs Immature Granulocytes 0.07 0.00 - 0.07 K/uL    Comment: Performed at [facility] [address] [place], [place] [address]  Hemoglobin A1c     Status: Abnormal   Collection Time: 01/24/22  5:00 AM  Result Value Ref Range   Hgb A1c MFr Bld 6.4 (H) 4.8 - 5.6 %    Comment: (NOTE) Pre diabetes:          5.7%-6.4%  Diabetes:              >  6.4%  Glycemic control for   <7.0% adults with diabetes    Mean Plasma Glucose 136.98 mg/dL    Comment: Performed at [facility] [address], [place], [place] [address]  Basic metabolic panel     Status: Abnormal   Collection Time: 01/24/22  5:00 AM  Result Value Ref Range   Sodium 126 (L) 135 - 145 mmol/L   Potassium 4.7 3.5 - 5.1 mmol/L    Comment: HEMOLYSIS AT THIS LEVEL [place] AFFECT RESULT   Chloride 97 (L) 98 - 111 mmol/L   CO2 19 (L) 22 - 32 mmol/L   Glucose, Bld 299 (H) 70 - 99 mg/dL    Comment: Glucose reference range applies only to samples taken after fasting for at least 8 hours.   BUN 10 8 - 23 mg/dL   Creatinine, Ser 0.62 0.44 - 1.00 mg/dL   Calcium 8.4 (L) 8.9 - 10.3 mg/dL   GFR, Estimated >60 >60 mL/min    Comment: (NOTE) Calculated using the CKD-EPI Creatinine Equation (2021)    Anion gap 10 5 - 15    Comment: Performed at [facility] [address], [place], [place] [address]  Magnesium     Status: Abnormal   Collection Time: 01/24/22  5:00 AM  Result Value Ref Range   Magnesium 1.6 (L) 1.7 - 2.4 mg/dL    Comment: Performed at [facility] [address], [place], [place] [address]  Phosphorus     Status: None   Collection  Time: 01/24/22  5:00 AM  Result Value Ref Range   Phosphorus 4.1 2.5 - 4.6 mg/dL    Comment: Performed at [facility] [address] [place], [place] [address]  CBC     Status: Abnormal   Collection Time: 01/24/22  5:00 AM  Result Value Ref Range   WBC 16.1 (H) 4.0 - 10.5 K/uL   RBC 4.15 3.87 - 5.11 MIL/uL   Hemoglobin 13.0 12.0 - 15.0 g/dL   HCT 38.3 36.0 - 46.0 %   MCV 92.3 80.0 - 100.0 fL   MCH 31.3 26.0 - 34.0 pg   MCHC 33.9 30.0 - 36.0 g/dL   RDW 14.1 11.5 - 15.5 %   Platelets 366 150 - 400 K/uL   nRBC 0.0 0.0 - 0.2 %    Comment: Performed at [facility], [place] [address] [place], [place] [address]  CBG monitoring, ED     Status: Abnormal   Collection Time: 01/24/22  5:22 AM  Result Value Ref Range   Glucose-Capillary 274 (H) 70 - 99 mg/dL    Comment: Glucose reference range applies only to samples taken after fasting for at least 8 hours.    DG Abdomen 1 View  Result Date: 01/24/2022 CLINICAL DATA:  NG tube placement EXAM: ABDOMEN - 1 VIEW COMPARISON:  CT abdomen and pelvis 01/23/2022 FINDINGS: Single AP radiograph for NG tube placement. The enteric tube tip and side port project over the expected location of the stomach. Mild dilation of gas-filled loops of small bowel compatible with known obstruction as seen on CT 01/23/2022. Surgical clips right upper quadrant. Vertebroplasty T12. IMPRESSION: NG tube in good position. Electronically Signed   By: Placido Sou M.D.   On: 01/24/2022 03:56   CT ABDOMEN PELVIS WO CONTRAST  Result Date: 01/24/2022 CLINICAL DATA:  Abdominal pain, acute, nonlocalized generalized EXAM: CT ABDOMEN AND PELVIS WITHOUT CONTRAST TECHNIQUE: Multidetector CT imaging of the abdomen and pelvis  was performed following the standard protocol without IV contrast. RADIATION DOSE REDUCTION: This exam was performed according to the departmental dose-optimization program which includes automated exposure control, adjustment of the mA and/or kV  according to patient size and/or use of iterative reconstruction technique. COMPARISON:  03/13/2020 FINDINGS: Lower chest: No acute abnormality. Hepatobiliary: Status post cholecystectomy. There is marked extrahepatic and mild intrahepatic biliary ductal dilation which appears progressive since prior examination with the extrahepatic bile duct measuring up to 21 mm in diameter. While this [place] simply represent post cholecystectomy change, a distal obstructing process could appear similarly. No definite intrahepatic mass identified on this noncontrast examination. Pancreas: Scattered parenchymal calcifications are seen within the pancreas, particular within the pancreatic head which Brucker reflect changes of chronic pancreatitis. These appears stable since prior examination. No superimposed peripancreatic inflammatory stranding. Cystic lesion within the a tail the pancreas demonstrates slight interval increase in size since prior examination measuring 20 x 13 mm at axial image # 26/3, not optimally characterized on this examination. Spleen: Unremarkable Adrenals/Urinary Tract: Adrenal glands are unremarkable. Kidneys are normal, without renal calculi, focal lesion, or hydronephrosis. Bladder is partially obscured by streak artifact from right total hip arthroplasty, however, the visualized segment appears unremarkable. Stomach/Bowel: Surgical changes of probable ileocecectomy are again identified. There is persistent diffusely dilated and fluid-filled loops of small bowel throughout the abdomen extending to the anastomosis within the right lower quadrant of the abdomen with inspissated stool within the terminal small bowel. The colon distal to the anastomosis appears decompressed and, together, the findings suggest a partial or developing small bowel obstruction at the surgical anastomosis. Mild ascites is again seen, unchanged. No free intraperitoneal gas. Moderate diverticulosis of the mid jejunum within the left mid  abdomen is again seen without superimposed acute inflammatory change in this region. Vascular/Lymphatic: Aortic atherosclerosis. No enlarged abdominal or pelvic lymph nodes. Reproductive: The pelvis is partially obscured, however, surgical changes of hysterectomy are identified. No definite axial mass. Other: No abdominal wall hernia.  Rectum unremarkable. Musculoskeletal: Osseous structures are diffusely osteopenic. T12 compression deformity status post vertebroplasty is again identified, unchanged. Degenerative changes are seen within the a lower lumbar spine. No acute bone abnormality. Right total hip arthroplasty has been performed. IMPRESSION: 1. Surgical changes of probable ileocecectomy. Persistent diffusely dilated and fluid-filled loops of small bowel throughout the abdomen extending to the anastomosis within the right lower quadrant of the abdomen with inspissated stool within the terminal small bowel. The colon distal to the anastomosis appears decompressed and, together, the findings suggest a partial or developing small bowel obstruction at the surgical anastomosis. 2. Status post cholecystectomy. Interval development of marked extrahepatic and mild intrahepatic biliary ductal dilation. While this [place] simply represent post cholecystectomy change, a distal obstructing process could appear similarly. Correlation with liver enzymes is recommended to exclude underlying obstructive process. 3. Slight interval increase in size of a cystic lesion within the tail the pancreas measuring 20 x 13 mm, not optimally characterized on this examination. This [place] represent a pseudocyst or cystic neoplasm such as a side branch intraductal papillary mucinous neoplasm. This could be further assessed with MRI examination, if indicated. 4. Moderate diverticulosis of the mid jejunum without superimposed acute inflammatory change. 5. Osteopenia. Stable T12 compression deformity status post vertebroplasty. Aortic  Atherosclerosis (ICD10-I70.0). Electronically Signed   By: Fidela Salisbury M.D.   On: 01/24/2022 00:16   DG Abdomen Acute W/Chest  Result Date: 01/23/2022 CLINICAL DATA:  Abdominal pain and weakness EXAM: DG ABDOMEN ACUTE WITH  1 VIEW CHEST COMPARISON:  CT abdomen and pelvis from earlier today and chest radiographs 12/21/2021 FINDINGS: Dilated loops of small bowel in the central abdomen. No radiopaque calculi or other significant radiographic abnormality is seen. Cholecystectomy clips. Low lung volumes. Bibasilar atelectasis/scarring. Chronic bilateral bronchitic changes. Stable heart size. No pleural effusion or pneumothorax. No acute osseous abnormality. Right THA. IMPRESSION: Gas-filled dilation of loops of small bowel in the central abdomen concerning for obstruction. Recommend correlation with same day CT abdomen and pelvis. Electronically Signed   By: Placido Sou M.D.   On: 01/23/2022 23:57    ROS - all of the below systems have been reviewed with the patient and positives are indicated with bold text General: chills, fever or night sweats Eyes: blurry vision or double vision ENT: epistaxis or sore throat Allergy/Immunology: itchy/watery eyes or nasal congestion Hematologic/Lymphatic: bleeding problems, blood clots or swollen lymph nodes Endocrine: temperature intolerance or unexpected weight changes Breast: new or changing breast lumps or nipple discharge Resp: cough, shortness of breath, or wheezing CV: chest pain or dyspnea on exertion GI: as per HPI GU: dysuria, trouble voiding, or hematuria MSK: joint pain or joint stiffness Neuro: TIA or stroke symptoms Derm: pruritus and skin lesion changes Psych: anxiety and depression  PE Blood pressure 119/66, pulse 89, temperature 98 F (36.7 C), temperature source Oral, resp. rate 19, height 5' 7.5" (1.715 m), weight 65.3 kg, SpO2 93 %. Constitutional: NAD; conversant Eyes: Moist conjunctiva; PERRL Lungs: Normal respiratory effort CV:  RRR; no palpable thrills; no pitting edema GI: Abd soft, nontender, mild to moderate distention MSK: Normal range of motion of extremities Psychiatric: Appropriate affect; alert and oriented x3  Results for orders placed or performed during the hospital encounter of 01/23/22 (from the past 48 hour(s))  Lipase, blood     Status: None   Collection Time: 01/23/22 10:46 PM  Result Value Ref Range   Lipase 44 11 - 51 U/L    Comment: Performed at [facility], [place] [address] [place], [place] [address]  Comprehensive metabolic panel     Status: Abnormal   Collection Time: 01/23/22 10:46 PM  Result Value Ref Range   Sodium 129 (L) 135 - 145 mmol/L   Potassium 3.9 3.5 - 5.1 mmol/L   Chloride 99 98 - 111 mmol/L   CO2 19 (L) 22 - 32 mmol/L   Glucose, Bld 231 (H) 70 - 99 mg/dL    Comment: Glucose reference range applies only to samples taken after fasting for at least 8 hours.   BUN 11 8 - 23 mg/dL   Creatinine, Ser 0.69 0.44 - 1.00 mg/dL   Calcium 8.9 8.9 - 10.3 mg/dL   Total Protein 6.4 (L) 6.5 - 8.1 g/dL   Albumin 3.4 (L) 3.5 - 5.0 g/dL   AST 26 15 - 41 U/L   ALT 29 0 - 44 U/L   Alkaline Phosphatase 95 38 - 126 U/L   Total Bilirubin 0.4 0.3 - 1.2 mg/dL   GFR, Estimated >60 >60 mL/min    Comment: (NOTE) Calculated using the CKD-EPI Creatinine Equation (2021)    Anion gap 11 5 - 15    Comment: Performed at [facility], [place] [address], [place],  [address]  CBC with Differential     Status: Abnormal   Collection Time: 01/23/22 10:46 PM  Result Value Ref Range   WBC 14.9 (H) 4.0 - 10.5 K/uL   RBC 4.09 3.87 - 5.11 MIL/uL   Hemoglobin  12.8 12.0 - 15.0 g/dL   HCT 37.2 36.0 - 46.0 %   MCV 91.0 80.0 - 100.0 fL   MCH 31.3 26.0 - 34.0 pg   MCHC 34.4 30.0 - 36.0 g/dL   RDW 13.7 11.5 - 15.5 %   Platelets 362 150 - 400 K/uL   nRBC 0.0 0.0 - 0.2 %   Neutrophils Relative % 81 %   Neutro Abs 12.1 (H) 1.7 - 7.7 K/uL   Lymphocytes Relative 12 %   Lymphs Abs 1.7 0.7 - 4.0  K/uL   Monocytes Relative 6 %   Monocytes Absolute 1.0 0.1 - 1.0 K/uL   Eosinophils Relative 0 %   Eosinophils Absolute 0.1 0.0 - 0.5 K/uL   Basophils Relative 0 %   Basophils Absolute 0.0 0.0 - 0.1 K/uL   Immature Granulocytes 1 %   Abs Immature Granulocytes 0.07 0.00 - 0.07 K/uL    Comment: Performed at [facility] [address], [place], [place] [address]  Hemoglobin A1c     Status: Abnormal   Collection Time: 01/24/22  5:00 AM  Result Value Ref Range   Hgb A1c MFr Bld 6.4 (H) 4.8 - 5.6 %    Comment: (NOTE) Pre diabetes:          5.7%-6.4%  Diabetes:              >6.4%  Glycemic control for   <7.0% adults with diabetes    Mean Plasma Glucose 136.98 mg/dL    Comment: Performed at [facility] [address], [place], [place] [address]  Basic metabolic panel     Status: Abnormal   Collection Time: 01/24/22  5:00 AM  Result Value Ref Range   Sodium 126 (L) 135 - 145 mmol/L   Potassium 4.7 3.5 - 5.1 mmol/L    Comment: HEMOLYSIS AT THIS LEVEL [place] AFFECT RESULT   Chloride 97 (L) 98 - 111 mmol/L   CO2 19 (L) 22 - 32 mmol/L   Glucose, Bld 299 (H) 70 - 99 mg/dL    Comment: Glucose reference range applies only to samples taken after fasting for at least 8 hours.   BUN 10 8 - 23 mg/dL   Creatinine, Ser 0.62 0.44 - 1.00 mg/dL   Calcium 8.4 (L) 8.9 - 10.3 mg/dL   GFR, Estimated >60 >60 mL/min    Comment: (NOTE) Calculated using the CKD-EPI Creatinine Equation (2021)    Anion gap 10 5 - 15    Comment: Performed at [facility] [address] [place],  [place] [address]  Magnesium     Status: Abnormal   Collection Time: 01/24/22  5:00 AM  Result Value Ref Range   Magnesium 1.6 (L) 1.7 - 2.4 mg/dL    Comment: Performed at [facility] [address] [place], [place] [address]  Phosphorus     Status: None   Collection Time: 01/24/22  5:00 AM  Result Value Ref Range   Phosphorus 4.1 2.5 - 4.6 mg/dL    Comment: Performed at [facility] [address], [place] [address]  CBC     Status: Abnormal   Collection Time: 01/24/22  5:00 AM  Result Value Ref Range   WBC 16.1 (H) 4.0 - 10.5 K/uL   RBC 4.15 3.87 - 5.11 MIL/uL   Hemoglobin 13.0 12.0 - 15.0 g/dL   HCT 38.3 36.0 - 46.0 %   MCV 92.3 80.0 - 100.0 fL  MCH 31.3 26.0 - 34.0 pg   MCHC 33.9 30.0 - 36.0 g/dL   RDW 14.1 11.5 - 15.5 %   Platelets 366 150 - 400 K/uL   nRBC 0.0 0.0 - 0.2 %    Comment: Performed at [facility], [place] [address], [place], [place] [address]  CBG monitoring, ED     Status: Abnormal   Collection Time: 01/24/22  5:22 AM  Result Value Ref Range   Glucose-Capillary 274 (H) 70 - 99 mg/dL    Comment: Glucose reference range applies only to samples taken after fasting for at least 8 hours.    DG Abdomen 1 View  Result Date: 01/24/2022 CLINICAL DATA:  NG tube placement EXAM: ABDOMEN - 1 VIEW COMPARISON:  CT abdomen and pelvis 01/23/2022 FINDINGS: Single AP radiograph for NG tube placement. The enteric tube tip and side port project over the expected location of the stomach. Mild dilation of gas-filled loops of small bowel compatible with known obstruction as seen on CT 01/23/2022. Surgical clips right upper quadrant. Vertebroplasty T12. IMPRESSION: NG tube in good position. Electronically Signed   By: Placido Sou M.D.   On: 01/24/2022 03:56   CT ABDOMEN PELVIS WO CONTRAST  Result Date: 01/24/2022 CLINICAL DATA:  Abdominal pain, acute, nonlocalized generalized EXAM: CT ABDOMEN AND PELVIS WITHOUT CONTRAST TECHNIQUE: Multidetector CT imaging of the abdomen and pelvis was performed following the standard protocol without IV contrast. RADIATION DOSE REDUCTION: This exam was performed according to the departmental dose-optimization program which includes automated exposure control, adjustment of the mA and/or kV according to patient size and/or use of iterative reconstruction technique. COMPARISON:  03/13/2020 FINDINGS: Lower chest: No acute  abnormality. Hepatobiliary: Status post cholecystectomy. There is marked extrahepatic and mild intrahepatic biliary ductal dilation which appears progressive since prior examination with the extrahepatic bile duct measuring up to 21 mm in diameter. While this Abernethy simply represent post cholecystectomy change, a distal obstructing process could appear similarly. No definite intrahepatic mass identified on this noncontrast examination. Pancreas: Scattered parenchymal calcifications are seen within the pancreas, particular within the pancreatic head which Knapke reflect changes of chronic pancreatitis. These appears stable since prior examination. No superimposed peripancreatic inflammatory stranding. Cystic lesion within the a tail the pancreas demonstrates slight interval increase in size since prior examination measuring 20 x 13 mm at axial image # 26/3, not optimally characterized on this examination. Spleen: Unremarkable Adrenals/Urinary Tract: Adrenal glands are unremarkable. Kidneys are normal, without renal calculi, focal lesion, or hydronephrosis. Bladder is partially obscured by streak artifact from right total hip arthroplasty, however, the visualized segment appears unremarkable. Stomach/Bowel: Surgical changes of probable ileocecectomy are again identified. There is persistent diffusely dilated and fluid-filled loops of small bowel throughout the abdomen extending to the anastomosis within the right lower quadrant of the abdomen with inspissated stool within the terminal small bowel. The colon distal to the anastomosis appears decompressed and, together, the findings suggest a partial or developing small bowel obstruction at the surgical anastomosis. Mild ascites is again seen, unchanged. No free intraperitoneal gas. Moderate diverticulosis of the mid jejunum within the left mid abdomen is again seen without superimposed acute inflammatory change in this region. Vascular/Lymphatic: Aortic atherosclerosis. No  enlarged abdominal or pelvic lymph nodes. Reproductive: The pelvis is partially obscured, however, surgical changes of hysterectomy are identified. No definite axial mass. Other: No abdominal wall hernia.  Rectum unremarkable. Musculoskeletal: Osseous structures are diffusely osteopenic. T12 compression deformity status post vertebroplasty is again identified, unchanged. Degenerative changes  are seen within the a lower lumbar spine. No acute bone abnormality. Right total hip arthroplasty has been performed. IMPRESSION: 1. Surgical changes of probable ileocecectomy. Persistent diffusely dilated and fluid-filled loops of small bowel throughout the abdomen extending to the anastomosis within the right lower quadrant of the abdomen with inspissated stool within the terminal small bowel. The colon distal to the anastomosis appears decompressed and, together, the findings suggest a partial or developing small bowel obstruction at the surgical anastomosis. 2. Status post cholecystectomy. Interval development of marked extrahepatic and mild intrahepatic biliary ductal dilation. While this Karan simply represent post cholecystectomy change, a distal obstructing process could appear similarly. Correlation with liver enzymes is recommended to exclude underlying obstructive process. 3. Slight interval increase in size of a cystic lesion within the tail the pancreas measuring 20 x 13 mm, not optimally characterized on this examination. This Cichowski represent a pseudocyst or cystic neoplasm such as a side branch intraductal papillary mucinous neoplasm. This could be further assessed with MRI examination, if indicated. 4. Moderate diverticulosis of the mid jejunum without superimposed acute inflammatory change. 5. Osteopenia. Stable T12 compression deformity status post vertebroplasty. Aortic Atherosclerosis (ICD10-I70.0). Electronically Signed   By: Fidela Salisbury M.D.   On: 01/24/2022 00:16   DG Abdomen Acute W/Chest  Result Date:  01/23/2022 CLINICAL DATA:  Abdominal pain and weakness EXAM: DG ABDOMEN ACUTE WITH 1 VIEW CHEST COMPARISON:  CT abdomen and pelvis from earlier today and chest radiographs 12/21/2021 FINDINGS: Dilated loops of small bowel in the central abdomen. No radiopaque calculi or other significant radiographic abnormality is seen. Cholecystectomy clips. Low lung volumes. Bibasilar atelectasis/scarring. Chronic bilateral bronchitic changes. Stable heart size. No pleural effusion or pneumothorax. No acute osseous abnormality. Right THA. IMPRESSION: Gas-filled dilation of loops of small bowel in the central abdomen concerning for obstruction. Recommend correlation with same day CT abdomen and pelvis. Electronically Signed   By: Placido Sou M.D.   On: 01/23/2022 23:57     A/P: [name] is an 88 y.o. 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  Nadeen Landau, [facility], [place]

## 2022-01-25 ENCOUNTER — Inpatient Hospital Stay (HOSPITAL_COMMUNITY): Payer: Medicare Other

## 2022-01-25 DIAGNOSIS — K56609 Unspecified intestinal obstruction, unspecified as to partial versus complete obstruction: Secondary | ICD-10-CM | POA: Diagnosis not present

## 2022-01-25 LAB — CBC
HCT: 35.6 % — ABNORMAL LOW (ref 36.0–46.0)
Hemoglobin: 12.2 g/dL (ref 12.0–15.0)
MCH: 31.9 pg (ref 26.0–34.0)
MCHC: 34.3 g/dL (ref 30.0–36.0)
MCV: 93 fL (ref 80.0–100.0)
Platelets: 333 10*3/uL (ref 150–400)
RBC: 3.83 MIL/uL — ABNORMAL LOW (ref 3.87–5.11)
RDW: 14.2 % (ref 11.5–15.5)
WBC: 9.7 10*3/uL (ref 4.0–10.5)
nRBC: 0 % (ref 0.0–0.2)

## 2022-01-25 LAB — BASIC METABOLIC PANEL
Anion gap: 8 (ref 5–15)
BUN: 8 mg/dL (ref 8–23)
CO2: 21 mmol/L — ABNORMAL LOW (ref 22–32)
Calcium: 8 mg/dL — ABNORMAL LOW (ref 8.9–10.3)
Chloride: 105 mmol/L (ref 98–111)
Creatinine, Ser: 0.74 mg/dL (ref 0.44–1.00)
GFR, Estimated: >60 mL/min (ref >60)
Glucose, Bld: 157 mg/dL — ABNORMAL HIGH (ref 70–99)
Potassium: 3.5 mmol/L (ref 3.5–5.1)
Sodium: 134 mmol/L — ABNORMAL LOW (ref 135–145)

## 2022-01-25 LAB — GLUCOSE, CAPILLARY
Glucose-Capillary: 106 mg/dL — ABNORMAL HIGH (ref 70–99)
Glucose-Capillary: 126 mg/dL — ABNORMAL HIGH (ref 70–99)
Glucose-Capillary: 135 mg/dL — ABNORMAL HIGH (ref 70–99)
Glucose-Capillary: 151 mg/dL — ABNORMAL HIGH (ref 70–99)
Glucose-Capillary: 155 mg/dL — ABNORMAL HIGH (ref 70–99)
Glucose-Capillary: 83 mg/dL (ref 70–99)

## 2022-01-25 MED ORDER — PHENOL 1.4 % MT LIQD: 1.0000 | OROMUCOSAL | Status: DC | PRN

## 2022-01-25 MED ORDER — MENTHOL 3 MG MT LOZG
1.0000 | LOZENGE | OROMUCOSAL | Status: DC | PRN
  Administered 2022-01-25 – 2022-01-26 (×3): 3 mg via ORAL
  Filled 2022-01-25: qty 9

## 2022-01-25 MED ORDER — LIDOCAINE VISCOUS HCL 2 % MT SOLN: 15.0000 mL | OROMUCOSAL | Status: DC | PRN

## 2022-01-25 MED ORDER — LACTATED RINGERS IV SOLN: INTRAVENOUS | Status: DC

## 2022-01-25 MED ORDER — PANTOPRAZOLE SODIUM 40 MG IV SOLR
40.0000 mg | Freq: Two times a day (BID) | INTRAVENOUS | Status: DC
  Administered 2022-01-25 – 2022-01-28 (×5): 40 mg via INTRAVENOUS
  Filled 2022-01-25 (×6): qty 10

## 2022-01-25 NOTE — Progress Notes (Signed)
  Progress Note Patient: [name] KBT:[id number] DOB: 15-Jun-1929 DOA: 01/23/2022  DOS: the patient was seen and examined on 01/25/2022  Brief hospital course: PMH of type II DM, seizure disorder, colon cancer SP right hemicolectomy presented to hospital with complaints of abdominal pain and nausea found to have SBO currently has an NG tube and general surgery following. Assessment and Plan: Principal Problem:   SBO (small bowel obstruction) ([place]) General surgery following. NG tube inserted. Small bowel protocol initiated. Patient is having difficulty with the NG tube and is trying to remove. Mittens applied. Monitor repeat x-ray.  Active Problems:   Uncontrolled type 2 diabetes mellitus with hyperglycemia, with long-term current use of insulin (HCC) We will continue every 4 hours sliding scale. Continue IV fluids.    Seizure disorder ([place]) On Dilantin at home. We will continue.    Acquired hypothyroidism On 75 mcg of Synthroid. We will initiate 37.5 mcg tomorrow.  Subjective: Trying to pull her NG tube out.  No nausea no vomiting or fever no chills.  Reports irritation of the NG tube.  Continues to have some abdominal discomfort.  Passing gas.  Had a BM.  Physical Exam: Vitals:   01/25/22 0500 01/25/22 0745 01/25/22 1605 01/25/22 1735  BP:  (!) 144/75 (!) 153/82 (!) 144/77  Pulse:  86 99 96  Resp:  14 15   Temp:  98.7 F (37.1 C) 98.4 F (36.9 C) 97.9 F (36.6 C)  TempSrc:  Oral Oral Oral  SpO2:  98% 97% 99%  Weight: 67.5 kg     Height:       General: Appear in mild distress; no visible Abnormal Neck Mass Or lumps, Conjunctiva normal Cardiovascular: S1 and S2 Present, aortic systolic  Murmur, Respiratory: good respiratory effort, Bilateral Air entry present and CTA, no Crackles, no wheezes Abdomen: Bowel Sound sluggish.  Nontender. Extremities: no Pedal edema Neurology: alert and oriented to time, place, and person  Gait not checked due to patient safety concerns    Data Reviewed: I have Reviewed nursing notes, Vitals, and Lab results since pt's last encounter. Pertinent lab results CBC and BMP I have ordered test including CBC and BMP I have ordered imaging studies x-ray abdomen.   Family Communication: No one at bedside  Disposition: Status is: Inpatient Remains inpatient appropriate because: Difficulty swallowing.  Still has NG tube.  Unresolved SBO.  Author: Berle Mull, MD 01/25/2022 6:36 PM  Please look on [email or web address] to find out who is on call.

## 2022-01-25 NOTE — Progress Notes (Signed)
Subjective/Chief Complaint: Complains of abd pain but states it is better than yesterday. Many bm's overnight   Objective: Vital signs in last 24 hours: Temp:  [97.8 F (36.6 C)-98.7 F (37.1 C)] 98.7 F (37.1 C) (09/24 0745) Pulse Rate:  [67-86] 86 (09/24 0745) Resp:  [14-20] 14 (09/24 0745) BP: (122-147)/(67-84) 144/75 (09/24 0745) SpO2:  [93 %-100 %] 98 % (09/24 0745) Weight:  [67.5 kg] 67.5 kg (09/24 0500)    Intake/Output from previous day: 09/23 0701 - 09/24 0700 In: 1047.4 [I.V.:944.4; IV Piggyback:103] Out: 700 [Emesis/NG output:700] Intake/Output this shift: No intake/output data recorded.  General appearance: alert and cooperative Resp: clear to auscultation bilaterally Cardio: regular rate and rhythm GI: soft, mild diffuse tenderness. Good bs  Lab Results:  Recent Labs    01/24/22 0500 01/25/22 0748  WBC 16.1* 9.7  HGB 13.0 12.2  HCT 38.3 35.6*  PLT 366 333   BMET Recent Labs    01/24/22 0500 01/25/22 0748  NA 126* 134*  K 4.7 3.5  CL 97* 105  CO2 19* 21*  GLUCOSE 299* 157*  BUN 10 8  CREATININE 0.62 0.74  CALCIUM 8.4* 8.0*   PT/INR No results for input(s): "LABPROT", "INR" in the last 72 hours. ABG No results for input(s): "PHART", "HCO3" in the last 72 hours.  Invalid input(s): "PCO2", "PO2"  Studies/Results: VAS [place] LOWER EXTREMITY VENOUS (DVT)  Result Date: 01/25/2022  Lower Venous DVT Study Patient Name:  [name]   Date of Exam:   01/24/2022 Medical Rec #: [id number]   Accession #:    [id number] Date of Birth: [birth year]  Patient Gender: F Patient Age:   88 years Exam Location:  [facility] Procedure:      VAS [place] LOWER EXTREMITY VENOUS (DVT) Referring Phys: TIMOTHY OPYD --------------------------------------------------------------------------------  Indications: Swelling.  Risk Factors: None identified. Limitations: Poor ultrasound/tissue interface and patient positioning. Comparison Study: No prior studies. Performing  Technologist: Oliver Hum RVT  Examination Guidelines: A complete evaluation includes B-mode imaging, spectral Doppler, color Doppler, and power Doppler as needed of all accessible portions of each vessel. Bilateral testing is considered an integral part of a complete examination. Limited examinations for reoccurring indications Sprick be performed as noted. The reflux portion of the exam is performed with the patient in reverse Trendelenburg.  +-----+---------------+---------+-----------+----------+--------------+ RIGHTCompressibilityPhasicitySpontaneityPropertiesThrombus Aging +-----+---------------+---------+-----------+----------+--------------+ CFV  Full           Yes      Yes                                 +-----+---------------+---------+-----------+----------+--------------+   +---------+---------------+---------+-----------+----------+--------------+ LEFT     CompressibilityPhasicitySpontaneityPropertiesThrombus Aging +---------+---------------+---------+-----------+----------+--------------+ CFV      Full           Yes      Yes                                 +---------+---------------+---------+-----------+----------+--------------+ SFJ      Full                                                        +---------+---------------+---------+-----------+----------+--------------+ FV Prox  Full                                                        +---------+---------------+---------+-----------+----------+--------------+  FV Mid   Full                                                        +---------+---------------+---------+-----------+----------+--------------+ FV DistalFull                                                        +---------+---------------+---------+-----------+----------+--------------+ PFV      Full                                                         +---------+---------------+---------+-----------+----------+--------------+ POP      Full           Yes      Yes                                 +---------+---------------+---------+-----------+----------+--------------+ PTV      Full                                                        +---------+---------------+---------+-----------+----------+--------------+ PERO     Full                                                        +---------+---------------+---------+-----------+----------+--------------+    Summary: RIGHT: - No evidence of common femoral vein obstruction.  LEFT: - There is no evidence of deep vein thrombosis in the lower extremity. However, portions of this examination were limited- see technologist comments above.  - No cystic structure found in the popliteal fossa.  *See table(s) above for measurements and observations. Electronically signed by Jamelle Haring on 01/25/2022 at 9:49:30 AM.    Final    DG Abd Portable 1V-Small Bowel Obstruction Protocol-initial, 8 hr delay  Result Date: 01/24/2022 CLINICAL DATA:  Small-bowel obstruction.  8 hour post image EXAM: PORTABLE ABDOMEN - 1 VIEW COMPARISON:  01/24/2022 at 3:30 a.m. FINDINGS: Contrast is seen within multiple loops of small bowel within the upper abdomen and left pelvis. No definite intra colonic contrast is identified, however. Multiple mildly dilated loops of small bowel are again identified throughout the abdomen in keeping with a distal small bowel obstruction. Nasogastric tube tip is seen within the epigastrium just beyond the expected gastroesophageal junction. Excreted contrast noted within the bladder lumen. Cholecystectomy clips in the right upper quadrant. T12 vertebroplasty has been performed. Right total hip arthroplasty has been performed. IMPRESSION: Persistent distal small bowel obstruction. Contrast is seen within multiple loops of small bowel within the upper abdomen and left pelvis. No definite intra  colonic contrast identified. Electronically Signed   By: Linwood Dibbles.D.  On: 01/24/2022 19:39   DG Abdomen 1 View  Result Date: 01/24/2022 CLINICAL DATA:  NG tube placement EXAM: ABDOMEN - 1 VIEW COMPARISON:  CT abdomen and pelvis 01/23/2022 FINDINGS: Single AP radiograph for NG tube placement. The enteric tube tip and side port project over the expected location of the stomach. Mild dilation of gas-filled loops of small bowel compatible with known obstruction as seen on CT 01/23/2022. Surgical clips right upper quadrant. Vertebroplasty T12. IMPRESSION: NG tube in good position. Electronically Signed   By: Placido Sou M.D.   On: 01/24/2022 03:56   CT ABDOMEN PELVIS WO CONTRAST  Result Date: 01/24/2022 CLINICAL DATA:  Abdominal pain, acute, nonlocalized generalized EXAM: CT ABDOMEN AND PELVIS WITHOUT CONTRAST TECHNIQUE: Multidetector CT imaging of the abdomen and pelvis was performed following the standard protocol without IV contrast. RADIATION DOSE REDUCTION: This exam was performed according to the departmental dose-optimization program which includes automated exposure control, adjustment of the mA and/or kV according to patient size and/or use of iterative reconstruction technique. COMPARISON:  03/13/2020 FINDINGS: Lower chest: No acute abnormality. Hepatobiliary: Status post cholecystectomy. There is marked extrahepatic and mild intrahepatic biliary ductal dilation which appears progressive since prior examination with the extrahepatic bile duct measuring up to 21 mm in diameter. While this Whiteside simply represent post cholecystectomy change, a distal obstructing process could appear similarly. No definite intrahepatic mass identified on this noncontrast examination. Pancreas: Scattered parenchymal calcifications are seen within the pancreas, particular within the pancreatic head which Yarde reflect changes of chronic pancreatitis. These appears stable since prior examination. No superimposed  peripancreatic inflammatory stranding. Cystic lesion within the a tail the pancreas demonstrates slight interval increase in size since prior examination measuring 20 x 13 mm at axial image # 26/3, not optimally characterized on this examination. Spleen: Unremarkable Adrenals/Urinary Tract: Adrenal glands are unremarkable. Kidneys are normal, without renal calculi, focal lesion, or hydronephrosis. Bladder is partially obscured by streak artifact from right total hip arthroplasty, however, the visualized segment appears unremarkable. Stomach/Bowel: Surgical changes of probable ileocecectomy are again identified. There is persistent diffusely dilated and fluid-filled loops of small bowel throughout the abdomen extending to the anastomosis within the right lower quadrant of the abdomen with inspissated stool within the terminal small bowel. The colon distal to the anastomosis appears decompressed and, together, the findings suggest a partial or developing small bowel obstruction at the surgical anastomosis. Mild ascites is again seen, unchanged. No free intraperitoneal gas. Moderate diverticulosis of the mid jejunum within the left mid abdomen is again seen without superimposed acute inflammatory change in this region. Vascular/Lymphatic: Aortic atherosclerosis. No enlarged abdominal or pelvic lymph nodes. Reproductive: The pelvis is partially obscured, however, surgical changes of hysterectomy are identified. No definite axial mass. Other: No abdominal wall hernia.  Rectum unremarkable. Musculoskeletal: Osseous structures are diffusely osteopenic. T12 compression deformity status post vertebroplasty is again identified, unchanged. Degenerative changes are seen within the a lower lumbar spine. No acute bone abnormality. Right total hip arthroplasty has been performed. IMPRESSION: 1. Surgical changes of probable ileocecectomy. Persistent diffusely dilated and fluid-filled loops of small bowel throughout the abdomen  extending to the anastomosis within the right lower quadrant of the abdomen with inspissated stool within the terminal small bowel. The colon distal to the anastomosis appears decompressed and, together, the findings suggest a partial or developing small bowel obstruction at the surgical anastomosis. 2. Status post cholecystectomy. Interval development of marked extrahepatic and mild intrahepatic biliary ductal dilation. While this Luscher simply represent post cholecystectomy  change, a distal obstructing process could appear similarly. Correlation with liver enzymes is recommended to exclude underlying obstructive process. 3. Slight interval increase in size of a cystic lesion within the tail the pancreas measuring 20 x 13 mm, not optimally characterized on this examination. This Beirne represent a pseudocyst or cystic neoplasm such as a side branch intraductal papillary mucinous neoplasm. This could be further assessed with MRI examination, if indicated. 4. Moderate diverticulosis of the mid jejunum without superimposed acute inflammatory change. 5. Osteopenia. Stable T12 compression deformity status post vertebroplasty. Aortic Atherosclerosis (ICD10-I70.0). Electronically Signed   By: Fidela Salisbury M.D.   On: 01/24/2022 00:16   DG Abdomen Acute W/Chest  Result Date: 01/23/2022 CLINICAL DATA:  Abdominal pain and weakness EXAM: DG ABDOMEN ACUTE WITH 1 VIEW CHEST COMPARISON:  CT abdomen and pelvis from earlier today and chest radiographs 12/21/2021 FINDINGS: Dilated loops of small bowel in the central abdomen. No radiopaque calculi or other significant radiographic abnormality is seen. Cholecystectomy clips. Low lung volumes. Bibasilar atelectasis/scarring. Chronic bilateral bronchitic changes. Stable heart size. No pleural effusion or pneumothorax. No acute osseous abnormality. Right THA. IMPRESSION: Gas-filled dilation of loops of small bowel in the central abdomen concerning for obstruction. Recommend correlation  with same day CT abdomen and pelvis. Electronically Signed   By: Placido Sou M.D.   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

## 2022-01-25 NOTE — Hospital Course (Addendum)
PMH of type II DM, seizure disorder, colon cancer SP right hemicolectomy presented to hospital with complaints of abdominal pain and nausea found to have SBO currently has an NG tube and general surgery following. NG tube removed on 9/25.

## 2022-01-25 NOTE — Progress Notes (Signed)
Patient continues to pull at NG tube. Dr. Posey Pronto notified. Mittens applied and order placed for abd x-ray to check for placement.

## 2022-01-26 ENCOUNTER — Ambulatory Visit: Payer: Medicare Other | Admitting: Podiatry

## 2022-01-26 DIAGNOSIS — K56609 Unspecified intestinal obstruction, unspecified as to partial versus complete obstruction: Secondary | ICD-10-CM | POA: Diagnosis not present

## 2022-01-26 LAB — GLUCOSE, CAPILLARY
Glucose-Capillary: 146 mg/dL — ABNORMAL HIGH (ref 70–99)
Glucose-Capillary: 177 mg/dL — ABNORMAL HIGH (ref 70–99)
Glucose-Capillary: 170 mg/dL — ABNORMAL HIGH (ref 70–99)
Glucose-Capillary: 153 mg/dL — ABNORMAL HIGH (ref 70–99)
Glucose-Capillary: 201 mg/dL — ABNORMAL HIGH (ref 70–99)
Glucose-Capillary: 180 mg/dL — ABNORMAL HIGH (ref 70–99)

## 2022-01-26 LAB — CBC
HCT: 37.1 % (ref 36.0–46.0)
Hemoglobin: 12.3 g/dL (ref 12.0–15.0)
MCH: 31.5 pg (ref 26.0–34.0)
MCHC: 33.2 g/dL (ref 30.0–36.0)
MCV: 94.9 fL (ref 80.0–100.0)
Platelets: 382 10*3/uL (ref 150–400)
RBC: 3.91 MIL/uL (ref 3.87–5.11)
RDW: 14.3 % (ref 11.5–15.5)
WBC: 13.5 10*3/uL — ABNORMAL HIGH (ref 4.0–10.5)
nRBC: 0 % (ref 0.0–0.2)

## 2022-01-26 LAB — BASIC METABOLIC PANEL
Anion gap: 15 (ref 5–15)
BUN: 10 mg/dL (ref 8–23)
CO2: 17 mmol/L — ABNORMAL LOW (ref 22–32)
Calcium: 8.3 mg/dL — ABNORMAL LOW (ref 8.9–10.3)
Chloride: 107 mmol/L (ref 98–111)
Creatinine, Ser: 0.98 mg/dL (ref 0.44–1.00)
GFR, Estimated: 55 mL/min — ABNORMAL LOW (ref >60)
Glucose, Bld: 186 mg/dL — ABNORMAL HIGH (ref 70–99)
Potassium: 3.5 mmol/L (ref 3.5–5.1)
Sodium: 139 mmol/L (ref 135–145)

## 2022-01-26 MED ORDER — SALINE SPRAY 0.65 % NA SOLN
1.0000 | NASAL | Status: DC | PRN
  Filled 2022-01-26: qty 44

## 2022-01-26 MED ORDER — LIP MEDEX EX OINT
TOPICAL_OINTMENT | CUTANEOUS | Status: DC | PRN
  Filled 2022-01-26: qty 7

## 2022-01-26 NOTE — Plan of Care (Signed)
  Problem: Education: Goal: Ability to describe self-care measures that [place] prevent or decrease complications (Diabetes Survival Skills Education) will improve Outcome: Progressing Goal: Individualized Educational Video(s) Outcome: Progressing   Problem: Coping: Goal: Ability to adjust to condition or change in health will improve Outcome: Progressing   Problem: Fluid Volume: Goal: Ability to maintain a balanced intake and output will improve Outcome: Progressing   Problem: Health Behavior/Discharge Planning: Goal: Ability to identify and utilize available resources and services will improve Outcome: Progressing Goal: Ability to manage health-related needs will improve Outcome: Progressing   Problem: Metabolic: Goal: Ability to maintain appropriate glucose levels will improve Outcome: Progressing   Problem: Nutritional: Goal: Maintenance of adequate nutrition will improve Outcome: Progressing Goal: Progress toward achieving an optimal weight will improve Outcome: Progressing   Problem: Skin Integrity: Goal: Risk for impaired skin integrity will decrease Outcome: Progressing   Problem: Tissue Perfusion: Goal: Adequacy of tissue perfusion will improve Outcome: Progressing   Problem: Education: Goal: Knowledge of General Education information will improve Description: Including pain rating scale, medication(s)/side effects and non-pharmacologic comfort measures Outcome: Progressing   Problem: Health Behavior/Discharge Planning: Goal: Ability to manage health-related needs will improve Outcome: Progressing   Problem: Clinical Measurements: Goal: Ability to maintain clinical measurements within normal limits will improve Outcome: Progressing Goal: Will remain free from infection Outcome: Progressing Goal: Diagnostic test results will improve Outcome: Progressing Goal: Respiratory complications will improve Outcome: Progressing Goal: Cardiovascular complication will  be avoided Outcome: Progressing   Problem: Activity: Goal: Risk for activity intolerance will decrease Outcome: Progressing   Problem: Nutrition: Goal: Adequate nutrition will be maintained Outcome: Progressing   Problem: Coping: Goal: Level of anxiety will decrease Outcome: Progressing   Problem: Elimination: Goal: Will not experience complications related to bowel motility Outcome: Progressing Goal: Will not experience complications related to urinary retention Outcome: Progressing   Problem: Pain Managment: Goal: General experience of comfort will improve Outcome: Progressing   Problem: Safety: Goal: Ability to remain free from injury will improve Outcome: Progressing   Problem: Skin Integrity: Goal: Risk for impaired skin integrity will decrease Outcome: Progressing   

## 2022-01-26 NOTE — Progress Notes (Signed)
  Progress Note Patient: [name] XBJ:[id number] DOB: Apr 04, 1930 DOA: 01/23/2022  DOS: the patient was seen and examined on 01/26/2022  Brief hospital course: PMH of type II DM, seizure disorder, colon cancer SP right hemicolectomy presented to hospital with complaints of abdominal pain and nausea found to have SBO currently has an NG tube and general surgery following. NG tube removed on 9/25. Assessment and Plan: SBO (small bowel obstruction) ([place]) General surgery following. NG tube inserted. Small bowel protocol initiated. Patient is having difficulty with the NG tube and is trying to remove. Mittens applied. As of 9/25 per general surgery recommendation NG tube was removed after clamping trial and the diet is advanced. Abdomen still remains distended therefore will remain on clear liquid diet.  Uncontrolled type 2 diabetes mellitus with hyperglycemia, with long-term current use of insulin (HCC) We will continue every 4 hours sliding scale. Continue IV fluids.   Seizure disorder ([place]) On Dilantin at home. We will continue.   Acquired hypothyroidism On 75 mcg of Synthroid. Initial plan was to start cortisone 37.5 mcg IV Synthroid.  We will resume 75 mcg Synthroid tomorrow as long as no other issues.  Subjective: Wants to remove the NG tube.  No nausea no vomiting after initiation of the clamping trial.  Passing gas.  Small BM.  Abdomen still tender and distended.  Physical Exam: Vitals:   01/26/22 0354 01/26/22 0450 01/26/22 0824 01/26/22 1618  BP: (!) 158/68  (!) 132/54 (!) 152/101  Pulse: 95  (!) 102 (!) 102  Resp: 16  17 (!) 22  Temp: 98.1 F (36.7 C)  99.2 F (37.3 C) 99.5 F (37.5 C)  TempSrc: Oral  Oral Axillary  SpO2: 99%  99% 99%  Weight:  65.4 kg    Height:       General: Appear in mild distress; no visible Abnormal Neck Mass Or lumps, Conjunctiva normal Cardiovascular: S1 and S2 Present, no Murmur, Respiratory: good respiratory effort, Bilateral Air entry  present and CTA, no Crackles, no wheezes Abdomen: Bowel Sound present, distended, mild tenderness diffusely present Extremities: no Pedal edema Neurology: alert and oriented to time, place, and person  Gait not checked due to patient safety concerns   Data Reviewed: I have Reviewed nursing notes, Vitals, and Lab results since pt's last encounter. Pertinent lab results CBC and BMP I have ordered test including CBC and BMP I have ordered imaging studies x-ray abdomen. I have discussed pt's care plan and test results with surgery.   Family Communication: Family at bedside  Disposition: Status is: Inpatient Remains inpatient appropriate because: Need to ensure that the patient is able to tolerate oral diet  Author: Berle Mull, MD 01/26/2022 6:22 PM  Please look on [email or web address] to find out who is on call.

## 2022-01-26 NOTE — Progress Notes (Signed)
[facility] Progress Note     Subjective: CC:  Reports one, small episode flatus since admission. Reports one BM last night. Ongoing RLQ pain. NG was not clamped yesterday  Objective: Vital signs in last 24 hours: Temp:  [97.9 F (36.6 C)-99.2 F (37.3 C)] 99.2 F (37.3 C) (09/25 0824) Pulse Rate:  [94-102] 102 (09/25 0824) Resp:  [14-17] 17 (09/25 0824) BP: (132-158)/(54-82) 132/54 (09/25 0824) SpO2:  [97 %-99 %] 99 % (09/25 0824) Weight:  [65.4 kg] 65.4 kg (09/25 0450)    Intake/Output from previous day: 09/24 0701 - 09/25 0700 In: 1183.3 [I.V.:900.3; NG/GT:180; IV Piggyback:103] Out: 1100 [Urine:400; Emesis/NG output:700] Intake/Output this shift: No intake/output data recorded.  PE: Gen:  Alert, NAD, pleasant Card:  Regular rate and rhythm, pedal pulses 2+ BL Pulm:  Normal effort, clear to auscultation bilaterally Abd: Soft, mild to moderate distention, TTP RLQ without peritonitis, previous laparotomy incision appears well-healing.   NG in place drain thin, clear, brown effluent. ~350 cc/12 hours.  Skin: warm and dry, no rashes  Psych: A&Ox3   Lab Results:  Recent Labs    01/24/22 0500 01/25/22 0748  WBC 16.1* 9.7  HGB 13.0 12.2  HCT 38.3 35.6*  PLT 366 333   BMET Recent Labs    01/24/22 0500 01/25/22 0748  NA 126* 134*  K 4.7 3.5  CL 97* 105  CO2 19* 21*  GLUCOSE 299* 157*  BUN 10 8  CREATININE 0.62 0.74  CALCIUM 8.4* 8.0*   PT/INR No results for input(s): "LABPROT", "INR" in the last 72 hours. CMP     Component Value Date/Time   NA 134 (L) 01/25/2022 0748   NA 130 (L) 12/24/2020 1143   NA 138 01/04/2013 1212   K 3.5 01/25/2022 0748   K 3.9 01/04/2013 1212   CL 105 01/25/2022 0748   CO2 21 (L) 01/25/2022 0748   CO2 22 01/04/2013 1212   GLUCOSE 157 (H) 01/25/2022 0748   GLUCOSE 285 (H) 01/04/2013 1212   BUN 8 01/25/2022 0748   BUN 14 12/24/2020 1143   BUN 10.3 01/04/2013 1212   CREATININE 0.74 01/25/2022 0748    CREATININE 0.78 12/01/2013 1659   CREATININE 1.0 01/04/2013 1212   CALCIUM 8.0 (L) 01/25/2022 0748   CALCIUM 9.6 01/04/2013 1212   PROT 6.4 (L) 01/23/2022 2246   PROT 6.3 12/24/2020 1143   PROT 6.4 01/04/2013 1212   ALBUMIN 3.4 (L) 01/23/2022 2246   ALBUMIN 4.2 12/24/2020 1143   ALBUMIN 3.6 01/04/2013 1212   AST 26 01/23/2022 2246   AST 14 01/04/2013 1212   ALT 29 01/23/2022 2246   ALT 16 01/04/2013 1212   ALKPHOS 95 01/23/2022 2246   ALKPHOS 52 01/04/2013 1212   BILITOT 0.4 01/23/2022 2246   BILITOT <0.2 12/24/2020 1143   BILITOT 1.01 01/04/2013 1212   GFRNONAA >60 01/25/2022 0748   GFRAA >60 01/02/2020 0455   Lipase     Component Value Date/Time   LIPASE 44 01/23/2022 2246       Studies/Results: DG Abd Portable 1V  Result Date: 01/25/2022 CLINICAL DATA:  NG tube placement EXAM: PORTABLE ABDOMEN - 1 VIEW COMPARISON:  01/24/2022 FINDINGS: NG tube tip is in the proximal stomach with the side port in the distal esophagus. IMPRESSION: NG tube tip in the proximal stomach. Electronically Signed   By: Rolm Baptise M.D.   On: 01/25/2022 19:25   DG Abd 2 Views  Result Date: 01/25/2022 CLINICAL DATA:  Small bowel obstruction. EXAM:  ABDOMEN - 2 VIEW COMPARISON:  January 24, 2022 FINDINGS: Persistent mild dilation of small bowel loops with maximum diameter of 3.7 cm. Enteric catheter is with side hole likely in the distal esophagus. Adjustment [place] be considered. No free gas seen. IMPRESSION: 1. Persistent mild dilation of small bowel loops. 2. Enteric catheter is with side hole likely in the distal esophagus. Adjustment [place] be considered. 3. No free gas seen. Electronically Signed   By: Fidela Salisbury M.D.   On: 01/25/2022 13:07   VAS [place] LOWER EXTREMITY VENOUS (DVT)  Result Date: 01/25/2022  Lower Venous DVT Study Patient Name:  [name]   Date of Exam:   01/24/2022 Medical Rec #: [id number]   Accession #:    [id number] Date of Birth: [birth year]  Patient Gender: F Patient Age:    88 years Exam Location:  [facility] Procedure:      VAS [place] LOWER EXTREMITY VENOUS (DVT) Referring Phys: TIMOTHY OPYD --------------------------------------------------------------------------------  Indications: Swelling.  Risk Factors: None identified. Limitations: Poor ultrasound/tissue interface and patient positioning. Comparison Study: No prior studies. Performing Technologist: Oliver Hum RVT  Examination Guidelines: A complete evaluation includes B-mode imaging, spectral Doppler, color Doppler, and power Doppler as needed of all accessible portions of each vessel. Bilateral testing is considered an integral part of a complete examination. Limited examinations for reoccurring indications [place] be performed as noted. The reflux portion of the exam is performed with the patient in reverse Trendelenburg.  +-----+---------------+---------+-----------+----------+--------------+ RIGHTCompressibilityPhasicitySpontaneityPropertiesThrombus Aging +-----+---------------+---------+-----------+----------+--------------+ CFV  Full           Yes      Yes                                 +-----+---------------+---------+-----------+----------+--------------+   +---------+---------------+---------+-----------+----------+--------------+ LEFT     CompressibilityPhasicitySpontaneityPropertiesThrombus Aging +---------+---------------+---------+-----------+----------+--------------+ CFV      Full           Yes      Yes                                 +---------+---------------+---------+-----------+----------+--------------+ SFJ      Full                                                        +---------+---------------+---------+-----------+----------+--------------+ FV Prox  Full                                                        +---------+---------------+---------+-----------+----------+--------------+ FV Mid   Full                                                         +---------+---------------+---------+-----------+----------+--------------+ FV DistalFull                                                        +---------+---------------+---------+-----------+----------+--------------+  PFV      Full                                                        +---------+---------------+---------+-----------+----------+--------------+ POP      Full           Yes      Yes                                 +---------+---------------+---------+-----------+----------+--------------+ PTV      Full                                                        +---------+---------------+---------+-----------+----------+--------------+ PERO     Full                                                        +---------+---------------+---------+-----------+----------+--------------+    Summary: RIGHT: - No evidence of common femoral vein obstruction.  LEFT: - There is no evidence of deep vein thrombosis in the lower extremity. However, portions of this examination were limited- see technologist comments above.  - No cystic structure found in the popliteal fossa.  *See table(s) above for measurements and observations. Electronically signed by Jamelle Haring on 01/25/2022 at 9:49:30 AM.    Final    DG Abd Portable 1V-Small Bowel Obstruction Protocol-initial, 8 hr delay  Result Date: 01/24/2022 CLINICAL DATA:  Small-bowel obstruction.  8 hour post image EXAM: PORTABLE ABDOMEN - 1 VIEW COMPARISON:  01/24/2022 at 3:30 a.m. FINDINGS: Contrast is seen within multiple loops of small bowel within the upper abdomen and left pelvis. No definite intra colonic contrast is identified, however. Multiple mildly dilated loops of small bowel are again identified throughout the abdomen in keeping with a distal small bowel obstruction. Nasogastric tube tip is seen within the epigastrium just beyond the expected gastroesophageal junction. Excreted contrast noted within the bladder lumen.  Cholecystectomy clips in the right upper quadrant. T12 vertebroplasty has been performed. Right total hip arthroplasty has been performed. IMPRESSION: Persistent distal small bowel obstruction. Contrast is seen within multiple loops of small bowel within the upper abdomen and left pelvis. No definite intra colonic contrast identified. Electronically Signed   By: Fidela Salisbury M.D.   On: 01/24/2022 19:39    Anti-infectives: Anti-infectives (From admission, onward)    None        Assessment/Plan  pSBO, suspect related to adhesions - hx R hemicolectomy by Dr. Dema Severin 03/2020 for colon cancer  - showing some clinical improvement with a BM, small amt flatus, decreasing NG output. She does have some abdominal distention and ongoing RLQ pain. KUB looks improved with a non-obstructive pattern. Contrast not visible in the colon.  - clamp NG tube. Allow sips of water. Plan to D/C NG tube around 1300 if she tolerates. Replace NG to LIWS if she develops progressive nausea,  vomiting, or wosening abd pain/distention.  - no emergent surgical needs  LOS: 2 days   I reviewed nursing notes, hospitalist notes, last 24 h vitals and pain scores, last 48 h intake and output, last 24 h labs and trends, and last 24 h imaging results.    Obie Dredge, PA-C [facility] Please see Amion for pager number during day hours 7:00am-4:30pm

## 2022-01-27 ENCOUNTER — Ambulatory Visit: Payer: Medicare Other | Admitting: Podiatry

## 2022-01-27 ENCOUNTER — Inpatient Hospital Stay (HOSPITAL_COMMUNITY): Payer: Medicare Other

## 2022-01-27 DIAGNOSIS — G40909 Epilepsy, unspecified, not intractable, without status epilepticus: Secondary | ICD-10-CM | POA: Diagnosis not present

## 2022-01-27 DIAGNOSIS — K56609 Unspecified intestinal obstruction, unspecified as to partial versus complete obstruction: Secondary | ICD-10-CM | POA: Diagnosis not present

## 2022-01-27 DIAGNOSIS — E039 Hypothyroidism, unspecified: Secondary | ICD-10-CM | POA: Diagnosis not present

## 2022-01-27 LAB — GLUCOSE, CAPILLARY
Glucose-Capillary: 162 mg/dL — ABNORMAL HIGH (ref 70–99)
Glucose-Capillary: 170 mg/dL — ABNORMAL HIGH (ref 70–99)
Glucose-Capillary: 177 mg/dL — ABNORMAL HIGH (ref 70–99)
Glucose-Capillary: 178 mg/dL — ABNORMAL HIGH (ref 70–99)
Glucose-Capillary: 235 mg/dL — ABNORMAL HIGH (ref 70–99)
Glucose-Capillary: 244 mg/dL — ABNORMAL HIGH (ref 70–99)

## 2022-01-27 MED ORDER — PHENYTOIN SODIUM EXTENDED 100 MG PO CAPS
300.0000 mg | ORAL_CAPSULE | Freq: Every day | ORAL | Status: DC
  Administered 2022-01-27 – 2022-01-28 (×2): 300 mg via ORAL
  Filled 2022-01-27 (×3): qty 3

## 2022-01-27 MED ORDER — ENOXAPARIN SODIUM 30 MG/0.3ML IJ SOSY
30.0000 mg | PREFILLED_SYRINGE | INTRAMUSCULAR | Status: DC
  Administered 2022-01-27 – 2022-01-28 (×2): 30 mg via SUBCUTANEOUS
  Filled 2022-01-27 (×2): qty 0.3

## 2022-01-27 MED ORDER — BISACODYL 10 MG RE SUPP
10.0000 mg | Freq: Once | RECTAL | Status: AC
  Administered 2022-01-27: 10 mg via RECTAL
  Filled 2022-01-27: qty 1

## 2022-01-27 MED ORDER — PHENYTOIN 50 MG PO CHEW
50.0000 mg | CHEWABLE_TABLET | Freq: Every day | ORAL | Status: DC
  Administered 2022-01-27 – 2022-01-28 (×2): 50 mg via ORAL
  Filled 2022-01-27 (×3): qty 1

## 2022-01-27 MED ORDER — PHENYTOIN SODIUM EXTENDED 100 MG PO CAPS
100.0000 mg | ORAL_CAPSULE | Freq: Once | ORAL | Status: AC
  Administered 2022-01-27: 100 mg via ORAL
  Filled 2022-01-27: qty 1

## 2022-01-27 MED ORDER — MELATONIN 3 MG PO TABS
3.0000 mg | ORAL_TABLET | Freq: Every day | ORAL | Status: DC
  Administered 2022-01-27 – 2022-01-28 (×2): 3 mg via ORAL
  Filled 2022-01-27 (×2): qty 1

## 2022-01-27 NOTE — Progress Notes (Signed)
01/27/2022 7:44 AM  Asked by physician to switch phenytoin from IV to PO.  PTA patient was receiving '350mg'$  PO qhs.  However, patient refused IV phenytoin doses last PM.  Therefor will order phenytoin '100mg'$  PO x 1 now, then resume home regimen this evening.  Thanks, [employer], Pharm.D., BCPS Clinical Pharmacist  **Pharmacist phone directory can be found on [email or web address] listed under [place].

## 2022-01-27 NOTE — Care Management Important Message (Signed)
Important Message  Patient Details  Name: [name] MRN: [id number] Date of Birth: [birth year]   Medicare Important Message Given:  Yes     Orbie Pyo 01/27/2022, 2:31 PM

## 2022-01-27 NOTE — Progress Notes (Signed)
  Progress Note Patient: [name] [id number]:[id number] DOB: Reha 27, 1931 DOA: 01/23/2022  DOS: the patient was seen and examined on 01/27/2022  Brief hospital course:  PMH of type II DM, seizure disorder, colon cancer SP right hemicolectomy presented to hospital with complaints of abdominal pain and nausea found to have SBO currently has an NG tube and general surgery following. NG tube removed on 9/25.  Assessment and Plan:  SBO (small bowel obstruction) ([place]) General surgery following. NG tube inserted, removed 9/25. Abdomen mildly distended, but having good BM, tolerating clear liquid diet, advance to full liquid diet today. Management per general surgery  Uncontrolled type 2 diabetes mellitus with hyperglycemia, with long-term current use of insulin (HCC) We will continue every 4 hours sliding scale. Continue IV fluids.   Seizure disorder ([place]) On Dilantin at home. We will continue.   Acquired hypothyroidism On 75 mcg of Synthroid. Initial plan was to start cortisone 37.5 mcg IV Synthroid.  We will resume 75 mcg Synthroid tomorrow as long as no other issues.  Subjective:  Patient reports bowel movement, denies any nausea or vomiting, she still reports some abdomen discomfort . -Reports poor sleep overnight, would like to try melatonin   Physical Exam: Vitals:   01/26/22 2008 01/27/22 0334 01/27/22 0348 01/27/22 0831  BP: (!) 130/58 (!) 129/56  127/63  Pulse: 92 84  78  Resp: '18 16  16  '$ Temp: 98.5 F (36.9 C) 98.7 F (37.1 C)  98.9 F (37.2 C)  TempSrc:  Oral  Oral  SpO2: 96% 96%  97%  Weight:   65.4 kg   Height:       Awake Alert, Oriented X 3, No new F.N deficits, Normal affect, frail deconditioned Symmetrical Chest wall movement, Good air movement bilaterally, CTAB RRR,No Gallops,Rubs or new Murmurs, No Parasternal Heave +ve B.Sounds, Abd Soft, mildly distended, but nontender, No rebound - guarding or rigidity. No Cyanosis, Clubbing or edema, No new Rash or bruise      Data Reviewed: I have Reviewed nursing notes, Vitals, and Lab results since pt's last encounter. Pertinent lab results CBC and BMP I have ordered test including CBC and BMP I have ordered imaging studies x-ray abdomen. I have discussed pt's care plan and test results with surgery.   Family Communication: None  at bedside  Disposition: Status is: Inpatient Remains inpatient appropriate because: Need to ensure that the patient is able to tolerate oral diet  Author: Phillips Climes, MD 01/27/2022 12:25 PM  Please look on [email or web address] to find out who is on call.

## 2022-01-27 NOTE — Progress Notes (Signed)
[facility] Progress Note     Subjective: NG tube removed yesterday afternoon. Patient denies nausea/vomiting. Says she is now passing a lot of flatus and has had several bowel movements. She still endorses mild abdominal pain.  Objective: Vital signs in last 24 hours: Temp:  [98.5 F (36.9 C)-99.5 F (37.5 C)] 98.9 F (37.2 C) (09/26 0831) Pulse Rate:  [78-102] 78 (09/26 0831) Resp:  [16-22] 16 (09/26 0831) BP: (127-152)/(56-101) 127/63 (09/26 0831) SpO2:  [96 %-99 %] 97 % (09/26 0831) Weight:  [65.4 kg] 65.4 kg (09/26 0348)    Intake/Output from previous day: 09/25 0701 - 09/26 0700 In: 741.6 [I.V.:741.6] Out: 550 [Urine:500; Emesis/NG output:50] Intake/Output this shift: No intake/output data recorded.  PE: Gen:  Alert, NAD, pleasant Pulm:  Normal work of breathing on room air Abd: Remains mildly distended but soft, well-healed laparotomy, minimal tender, no peritoneal signs. Skin: warm and dry, no rashes  Psych: A&Ox3   Lab Results:  Recent Labs    01/25/22 0748 01/26/22 0938  WBC 9.7 13.5*  HGB 12.2 12.3  HCT 35.6* 37.1  PLT 333 382   BMET Recent Labs    01/25/22 0748 01/26/22 0938  NA 134* 139  K 3.5 3.5  CL 105 107  CO2 21* 17*  GLUCOSE 157* 186*  BUN 8 10  CREATININE 0.74 0.98  CALCIUM 8.0* 8.3*   PT/INR No results for input(s): "LABPROT", "INR" in the last 72 hours. CMP     Component Value Date/Time   NA 139 01/26/2022 0938   NA 130 (L) 12/24/2020 1143   NA 138 01/04/2013 1212   K 3.5 01/26/2022 0938   K 3.9 01/04/2013 1212   CL 107 01/26/2022 0938   CO2 17 (L) 01/26/2022 0938   CO2 22 01/04/2013 1212   GLUCOSE 186 (H) 01/26/2022 0938   GLUCOSE 285 (H) 01/04/2013 1212   BUN 10 01/26/2022 0938   BUN 14 12/24/2020 1143   BUN 10.3 01/04/2013 1212   CREATININE 0.98 01/26/2022 0938   CREATININE 0.78 12/01/2013 1659   CREATININE 1.0 01/04/2013 1212   CALCIUM 8.3 (L) 01/26/2022 0938   CALCIUM 9.6 01/04/2013 1212   PROT  6.4 (L) 01/23/2022 2246   PROT 6.3 12/24/2020 1143   PROT 6.4 01/04/2013 1212   ALBUMIN 3.4 (L) 01/23/2022 2246   ALBUMIN 4.2 12/24/2020 1143   ALBUMIN 3.6 01/04/2013 1212   AST 26 01/23/2022 2246   AST 14 01/04/2013 1212   ALT 29 01/23/2022 2246   ALT 16 01/04/2013 1212   ALKPHOS 95 01/23/2022 2246   ALKPHOS 52 01/04/2013 1212   BILITOT 0.4 01/23/2022 2246   BILITOT <0.2 12/24/2020 1143   BILITOT 1.01 01/04/2013 1212   GFRNONAA 55 (L) 01/26/2022 0938   GFRAA >60 01/02/2020 0455   Lipase     Component Value Date/Time   LIPASE 44 01/23/2022 2246       Studies/Results: DG Abd Portable 1V  Result Date: 01/25/2022 CLINICAL DATA:  NG tube placement EXAM: PORTABLE ABDOMEN - 1 VIEW COMPARISON:  01/24/2022 FINDINGS: NG tube tip is in the proximal stomach with the side port in the distal esophagus. IMPRESSION: NG tube tip in the proximal stomach. Electronically Signed   By: Rolm Baptise M.D.   On: 01/25/2022 19:25   DG Abd 2 Views  Result Date: 01/25/2022 CLINICAL DATA:  Small bowel obstruction. EXAM: ABDOMEN - 2 VIEW COMPARISON:  January 24, 2022 FINDINGS: Persistent mild dilation of small bowel loops with maximum diameter of  3.7 cm. Enteric catheter is with side hole likely in the distal esophagus. Adjustment [place] be considered. No free gas seen. IMPRESSION: 1. Persistent mild dilation of small bowel loops. 2. Enteric catheter is with side hole likely in the distal esophagus. Adjustment [place] be considered. 3. No free gas seen. Electronically Signed   By: Fidela Salisbury M.D.   On: 01/25/2022 13:07    Anti-infectives: Anti-infectives (From admission, onward)    None        Assessment/Plan  pSBO, suspect related to adhesions - hx R hemicolectomy by Dr. Dema Severin 03/2020 for colon cancer  - Obstructive symptoms continue to improve and patient is now having regular bowel function. - Advance to full liquids. If patient tolerates, [place] advance to soft diet this afternoon.    LOS:  3 days   I reviewed nursing notes, hospitalist notes, last 24 h vitals and pain scores, last 48 h intake and output, last 24 h labs and trends, and last 24 h imaging results.  Michaelle Birks, MD [facility] Surgery General, Hepatobiliary and Pancreatic Surgery 01/27/22 9:38 AM

## 2022-01-28 DIAGNOSIS — G40909 Epilepsy, unspecified, not intractable, without status epilepticus: Secondary | ICD-10-CM | POA: Diagnosis not present

## 2022-01-28 DIAGNOSIS — E1165 Type 2 diabetes mellitus with hyperglycemia: Secondary | ICD-10-CM | POA: Diagnosis not present

## 2022-01-28 DIAGNOSIS — K56609 Unspecified intestinal obstruction, unspecified as to partial versus complete obstruction: Secondary | ICD-10-CM | POA: Diagnosis not present

## 2022-01-28 DIAGNOSIS — E039 Hypothyroidism, unspecified: Secondary | ICD-10-CM | POA: Diagnosis not present

## 2022-01-28 LAB — CBC
HCT: 30.7 % — ABNORMAL LOW (ref 36.0–46.0)
Hemoglobin: 10.6 g/dL — ABNORMAL LOW (ref 12.0–15.0)
MCH: 31.3 pg (ref 26.0–34.0)
MCHC: 34.5 g/dL (ref 30.0–36.0)
MCV: 90.6 fL (ref 80.0–100.0)
Platelets: 300 10*3/uL (ref 150–400)
RBC: 3.39 MIL/uL — ABNORMAL LOW (ref 3.87–5.11)
RDW: 13.5 % (ref 11.5–15.5)
WBC: 6 10*3/uL (ref 4.0–10.5)
nRBC: 0 % (ref 0.0–0.2)

## 2022-01-28 LAB — GLUCOSE, CAPILLARY
Glucose-Capillary: 172 mg/dL — ABNORMAL HIGH (ref 70–99)
Glucose-Capillary: 227 mg/dL — ABNORMAL HIGH (ref 70–99)
Glucose-Capillary: 181 mg/dL — ABNORMAL HIGH (ref 70–99)
Glucose-Capillary: 232 mg/dL — ABNORMAL HIGH (ref 70–99)
Glucose-Capillary: 256 mg/dL — ABNORMAL HIGH (ref 70–99)

## 2022-01-28 LAB — BASIC METABOLIC PANEL
Anion gap: 8 (ref 5–15)
BUN: <5 mg/dL — ABNORMAL LOW (ref 8–23)
CO2: 23 mmol/L (ref 22–32)
Calcium: 7.7 mg/dL — ABNORMAL LOW (ref 8.9–10.3)
Chloride: 98 mmol/L (ref 98–111)
Creatinine, Ser: 0.63 mg/dL (ref 0.44–1.00)
GFR, Estimated: >60 mL/min (ref >60)
Glucose, Bld: 178 mg/dL — ABNORMAL HIGH (ref 70–99)
Potassium: 3.5 mmol/L (ref 3.5–5.1)
Sodium: 129 mmol/L — ABNORMAL LOW (ref 135–145)

## 2022-01-28 LAB — PHOSPHORUS: Phosphorus: 2.2 mg/dL — ABNORMAL LOW (ref 2.5–4.6)

## 2022-01-28 MED ORDER — INSULIN ASPART 100 UNIT/ML IJ SOLN
0.0000 [IU] | Freq: Three times a day (TID) | INTRAMUSCULAR | Status: DC
  Administered 2022-01-28: 2 [IU] via SUBCUTANEOUS
  Administered 2022-01-28 – 2022-01-29 (×2): 3 [IU] via SUBCUTANEOUS

## 2022-01-28 MED ORDER — PANTOPRAZOLE SODIUM 40 MG PO TBEC
40.0000 mg | DELAYED_RELEASE_TABLET | Freq: Every day | ORAL | Status: DC
  Administered 2022-01-29: 40 mg via ORAL
  Filled 2022-01-28: qty 1

## 2022-01-28 MED ORDER — VERAPAMIL HCL ER 200 MG PO CP24: 100.0000 mg | ORAL_CAPSULE | Freq: Every day | ORAL | Status: DC

## 2022-01-28 MED ORDER — VERAPAMIL HCL ER 120 MG PO TBCR
120.0000 mg | EXTENDED_RELEASE_TABLET | Freq: Every day | ORAL | Status: DC
  Administered 2022-01-28 – 2022-01-29 (×2): 120 mg via ORAL
  Filled 2022-01-28 (×2): qty 1

## 2022-01-28 MED ORDER — LEVOTHYROXINE SODIUM 75 MCG PO TABS
75.0000 ug | ORAL_TABLET | Freq: Every day | ORAL | Status: DC
  Administered 2022-01-28 – 2022-01-29 (×2): 75 ug via ORAL
  Filled 2022-01-28 (×2): qty 1

## 2022-01-28 MED ORDER — PRIMIDONE 250 MG PO TABS
250.0000 mg | ORAL_TABLET | Freq: Every day | ORAL | Status: DC
  Administered 2022-01-28: 250 mg via ORAL
  Filled 2022-01-28 (×2): qty 1

## 2022-01-28 MED ORDER — ALPRAZOLAM 0.5 MG PO TABS: 0.2500 mg | ORAL_TABLET | Freq: Two times a day (BID) | ORAL | Status: DC | PRN

## 2022-01-28 MED ORDER — FUROSEMIDE 10 MG/ML IJ SOLN
20.0000 mg | Freq: Once | INTRAMUSCULAR | Status: AC
  Administered 2022-01-28: 20 mg via INTRAVENOUS
  Filled 2022-01-28: qty 2

## 2022-01-28 NOTE — Evaluation (Signed)
Physical Therapy Evaluation Patient Details Name: [name] MRN: [id number] DOB: [birth year] Today's Date: 01/28/2022  History of Present Illness  88 y.o. female with medical history significant for insulin-dependent diabetes mellitus, seizure disorder, colon cancer status post right hemicolectomy in 2021, and tremor presented  with abdominal pain and nausea.  Found to have a SBO.  Clinical Impression   Pt presents with impaired activity tolerance, LE weakness, impaired gait, impaired balance vs baseline. Pt to benefit from acute PT to address deficits. Pt ambulated to/from bathroom only this session and reports feeling much weaker than baseline, pt with multiple bouts of diarrhea today including during session. PT anticipates pt will progress well with mobility during acute stay, recommending increased support from family and caregiver, as well as HHPT at d/c. PT to progress mobility as tolerated, and will continue to follow acutely.         Recommendations for follow up therapy are one component of a multi-disciplinary discharge planning process, led by the attending physician.  Recommendations [place] be updated based on patient status, additional functional criteria and insurance authorization.  Follow Up Recommendations Home health PT      Assistance Recommended at Discharge Frequent or constant Supervision/Assistance  Patient can return home with the following  A little help with walking and/or transfers;A little help with bathing/dressing/bathroom    Equipment Recommendations None recommended by PT  Recommendations for Other Services       Functional Status Assessment Patient has had a recent decline in their functional status and demonstrates the ability to make significant improvements in function in a reasonable and predictable amount of time.     Precautions / Restrictions Precautions Precautions: Fall Restrictions Weight Bearing Restrictions: No      Mobility  Bed  Mobility Overal bed mobility: Needs Assistance Bed Mobility: Supine to Sit, Sit to Supine     Supine to sit: Min assist, HOB elevated Sit to supine: Min assist, HOB elevated   General bed mobility comments: assist for trunk management, increased time and effort and use of bedrails.    Transfers Overall transfer level: Needs assistance Equipment used: Rolling walker (2 wheels) Transfers: Sit to/from Stand Sit to Stand: Min assist           General transfer comment: assist for power up and rise, STS x2 from EOB and toilet.    Ambulation/Gait Ambulation/Gait assistance: Min guard Gait Distance (Feet): 12 Feet (x2 - to and from toilet) Assistive device: Rolling walker (2 wheels) Gait Pattern/deviations: Step-through pattern, Decreased stride length, Trunk flexed, Shuffle Gait velocity: dcr     General Gait Details: for safety, slowed and short steps  Stairs            Wheelchair Mobility    Modified Rankin (Stroke Patients Only)       Balance Overall balance assessment: Needs assistance Sitting-balance support: No upper extremity supported, Feet supported Sitting balance-Leahy Scale: Fair     Standing balance support: Bilateral upper extremity supported, During functional activity, Reliant on assistive device for balance Standing balance-Leahy Scale: Poor                               Pertinent Vitals/Pain Pain Assessment Pain Assessment: No/denies pain    Home Living Family/patient expects to be discharged to:: Private residence Living Arrangements: Alone Available Help at Discharge: Family;Personal care attendant;Available PRN/intermittently Type of Home: House Home Access: Stairs to enter Entrance Stairs-Rails: Right Entrance Stairs-Number  of Steps: 4   Home Layout: One level Home Equipment: Conservation officer, nature (2 wheels);Rollator (4 wheels);Shower seat;BSC/3in1 Additional Comments: sister helps F-Sun, caregiver w-Th, then niece helps  M-T, but none overnight    Prior Function Prior Level of Function : Independent/Modified Independent             Mobility Comments: rollator with no assist ADLs Comments: Performs ADLs. Family and aide does IADLs. Neice does drives.     Hand Dominance   Dominant Hand: Right    Extremity/Trunk Assessment   Upper Extremity Assessment Upper Extremity Assessment: Defer to OT evaluation    Lower Extremity Assessment Lower Extremity Assessment: Generalized weakness    Cervical / Trunk Assessment Cervical / Trunk Assessment: Kyphotic  Communication   Communication: No difficulties  Cognition Arousal/Alertness: Awake/alert Behavior During Therapy: WFL for tasks assessed/performed Overall Cognitive Status: Within Functional Limits for tasks assessed                                          General Comments General comments (skin integrity, edema, etc.): diarrhea during session, pt reports multiple bouts today    Exercises     Assessment/Plan    PT Assessment Patient needs continued PT services  PT Problem List Decreased strength;Decreased mobility;Decreased activity tolerance;Decreased balance;Decreased knowledge of use of DME;Pain       PT Treatment Interventions DME instruction;Therapeutic activities;Gait training;Therapeutic exercise;Patient/family education;Balance training;Stair training;Functional mobility training;Neuromuscular re-education    PT Goals (Current goals can be found in the Care Plan section)  Acute Rehab PT Goals Patient Stated Goal: home PT Goal Formulation: With patient Time For Goal Achievement: 02/11/22 Potential to Achieve Goals: Good    Frequency Min 3X/week     Co-evaluation               AM-PAC PT "6 Clicks" Mobility  Outcome Measure Help needed turning from your back to your side while in a flat bed without using bedrails?: A Little Help needed moving from lying on your back to sitting on the side of a  flat bed without using bedrails?: A Little Help needed moving to and from a bed to a chair (including a wheelchair)?: A Little Help needed standing up from a chair using your arms (e.g., wheelchair or bedside chair)?: A Little Help needed to walk in hospital room?: A Little Help needed climbing 3-5 steps with a railing? : A Little 6 Click Score: 18    End of Session   Activity Tolerance: Patient limited by fatigue Patient left: in bed;with call bell/phone within reach;with bed alarm set Nurse Communication: Mobility status PT Visit Diagnosis: Other abnormalities of gait and mobility (R26.89);Muscle weakness (generalized) (M62.81)    Time: 1610-9604 PT Time Calculation (min) (ACUTE ONLY): 23 min   Charges:   PT Evaluation $PT Eval Low Complexity: 1 Low PT Treatments $Therapeutic Activity: 8-22 mins      Stacie Glaze, PT DPT Acute Rehabilitation Services Pager [phone]  Office [phone]   Louis Matte 01/28/2022, 3:48 PM

## 2022-01-28 NOTE — Progress Notes (Signed)
PT Cancellation Note  Patient Details Name: [name] MRN: [id number] DOB: June 13, 1929   Cancelled Treatment:    Reason Eval/Treat Not Completed: Fatigue/lethargy limiting ability to participate - pt reports fatigue from OOB to bathroom and bath, requests PT check back later.   Stacie Glaze, PT DPT Acute Rehabilitation Services Pager [phone]  Office [phone]    [place] 01/28/2022, 11:22 AM

## 2022-01-28 NOTE — Evaluation (Signed)
Occupational Therapy Evaluation Patient Details Name: [name] MRN: [id number] DOB: [birth year] Today's Date: 01/28/2022   History of Present Illness 88 y.o. female with medical history significant for insulin-dependent diabetes mellitus, seizure disorder, colon cancer status post right hemicolectomy in 2021, and tremor presented  with abdominal pain and nausea.  Found to have a SBO.   Clinical Impression   Patient admitted for the diagnosis above.  She is feeling a little weak and unsteady, but is very close to her baseline.  OT will follow in the acute setting and no post acute OT is anticipated given the level of assist she has at home.        Recommendations for follow up therapy are one component of a multi-disciplinary discharge planning process, led by the attending physician.  Recommendations [place] be updated based on patient status, additional functional criteria and insurance authorization.   Follow Up Recommendations  No OT follow up    Assistance Recommended at Discharge Intermittent Supervision/Assistance  Patient can return home with the following      Functional Status Assessment  Patient has had a recent decline in their functional status and demonstrates the ability to make significant improvements in function in a reasonable and predictable amount of time.  Equipment Recommendations  None recommended by OT    Recommendations for Other Services       Precautions / Restrictions Precautions Precautions: Fall Restrictions Weight Bearing Restrictions: No      Mobility Bed Mobility Overal bed mobility: Modified Independent                  Transfers Overall transfer level: Needs assistance Equipment used: Rolling walker (2 wheels) Transfers: Sit to/from Stand, Bed to chair/wheelchair/BSC Sit to Stand: Min guard     Step pivot transfers: Supervision            Balance Overall balance assessment: Needs assistance Sitting-balance support: Feet  supported Sitting balance-Leahy Scale: Good     Standing balance support: Bilateral upper extremity supported Standing balance-Leahy Scale: Fair                             ADL either performed or assessed with clinical judgement   ADL       Grooming: Wash/dry hands;Wash/dry face;Oral care;Supervision/safety;Standing           Upper Body Dressing : Set up;Sitting   Lower Body Dressing: Supervision/safety;Sit to/from stand   Toilet Transfer: Supervision/safety                   Vision Baseline Vision/History: 1 Wears glasses Patient Visual Report: No change from baseline       Perception Perception Perception: Not tested   Praxis Praxis Praxis: Not tested    Pertinent Vitals/Pain Pain Assessment Pain Assessment: No/denies pain     Hand Dominance Right   Extremity/Trunk Assessment Upper Extremity Assessment Upper Extremity Assessment: Overall WFL for tasks assessed   Lower Extremity Assessment Lower Extremity Assessment: Defer to PT evaluation   Cervical / Trunk Assessment Cervical / Trunk Assessment: Kyphotic   Communication Communication Communication: No difficulties   Cognition Arousal/Alertness: Awake/alert Behavior During Therapy: WFL for tasks assessed/performed Overall Cognitive Status: Within Functional Limits for tasks assessed                                 General Comments: Mild STM deficts  General Comments   VSS on RA    Exercises     Shoulder Instructions      Home Living Family/patient expects to be discharged to:: Private residence Living Arrangements: Alone Available Help at Discharge: Family;Personal care attendant;Available PRN/intermittently Type of Home: House Home Access: Stairs to enter [employer] of Steps: 4 Entrance Stairs-Rails: Right Home Layout: One level     Bathroom Shower/Tub: Occupational psychologist: Standard     Home Equipment: Chartered certified accountant (2 wheels);Rollator (4 wheels);Shower seat;BSC/3in1   Additional Comments: sister helps F-Sun, caregiver w-Th, then niece helps M-T, but none overnight      Prior Functioning/Environment               Mobility Comments: rollator with no assist ADLs Comments: Performs ADLs. Family and aide does IADLs. Neice does drives.        OT Problem List: Decreased activity tolerance;Impaired balance (sitting and/or standing);Decreased strength      OT Treatment/Interventions: Self-care/ADL training;Patient/family education;Balance training;Therapeutic activities    OT Goals(Current goals can be found in the care plan section) Acute Rehab OT Goals Patient Stated Goal: Ready to go home OT Goal Formulation: With patient Time For Goal Achievement: 02/11/22 Potential to Achieve Goals: Good ADL Goals Pt Will Perform Grooming: with modified independence;standing Pt Will Perform Lower Body Dressing: with modified independence;sit to/from stand Pt Will Transfer to Toilet: with modified independence;ambulating;regular height toilet  OT Frequency: Min 2X/week    Co-evaluation              AM-PAC OT "6 Clicks" Daily Activity     Outcome Measure Help from another person eating meals?: None Help from another person taking care of personal grooming?: A Little Help from another person toileting, which includes using toliet, bedpan, or urinal?: A Little Help from another person bathing (including washing, rinsing, drying)?: A Little Help from another person to put on and taking off regular upper body clothing?: None Help from another person to put on and taking off regular lower body clothing?: A Little 6 Click Score: 20   End of Session Equipment Utilized During Treatment: Rolling walker (2 wheels) Nurse Communication: Mobility status  Activity Tolerance: Patient tolerated treatment well Patient left: in chair;with call bell/phone within reach  OT Visit Diagnosis: Unsteadiness on  feet (R26.81)                Time: 2330-0762 OT Time Calculation (min): 40 min Charges:  OT General Charges $OT Visit: 1 Visit OT Evaluation $OT Eval Moderate Complexity: 1 Mod OT Treatments $Self Care/Home Management : 23-37 mins  01/28/2022  RP, OTR/L  Acute Rehabilitation Services  Office:  [phone]   Metta Clines 01/28/2022, 9:01 AM

## 2022-01-28 NOTE — Progress Notes (Signed)
[facility] Progress Note     Subjective: Patient feels abdominal bloating is improving. Continues passing flatus. Has not yet had solid food.  Objective: Vital signs in last 24 hours: Temp:  [98.9 F (37.2 C)-99.3 F (37.4 C)] 98.9 F (37.2 C) (09/27 0759) Pulse Rate:  [77-83] 83 (09/27 0759) Resp:  [15-16] 15 (09/27 0759) BP: (130-138)/(68-71) 130/68 (09/27 0759) SpO2:  [96 %-97 %] 96 % (09/27 0759) Last BM Date : 01/27/22  Intake/Output from previous day: 09/26 0701 - 09/27 0700 In: -  Out: 300 [Urine:300] Intake/Output this shift: Total I/O In: 240 [P.O.:240] Out: -   PE: Gen:  Alert, NAD, pleasant Pulm:  Normal work of breathing on room air Abd: Mildly distended but softer compared to prior exams. Nontender to palpation. Well-healed surgical scar. Skin: warm and dry, no rashes  Psych: A&Ox3   Lab Results:  Recent Labs    01/26/22 0938 01/28/22 0743  WBC 13.5* 6.0  HGB 12.3 10.6*  HCT 37.1 30.7*  PLT 382 300   BMET Recent Labs    01/26/22 0938 01/28/22 0743  NA 139 129*  K 3.5 3.5  CL 107 98  CO2 17* 23  GLUCOSE 186* 178*  BUN 10 <5*  CREATININE 0.98 0.63  CALCIUM 8.3* 7.7*   PT/INR No results for input(s): "LABPROT", "INR" in the last 72 hours. CMP     Component Value Date/Time   NA 129 (L) 01/28/2022 0743   NA 130 (L) 12/24/2020 1143   NA 138 01/04/2013 1212   K 3.5 01/28/2022 0743   K 3.9 01/04/2013 1212   CL 98 01/28/2022 0743   CO2 23 01/28/2022 0743   CO2 22 01/04/2013 1212   GLUCOSE 178 (H) 01/28/2022 0743   GLUCOSE 285 (H) 01/04/2013 1212   BUN <5 (L) 01/28/2022 0743   BUN 14 12/24/2020 1143   BUN 10.3 01/04/2013 1212   CREATININE 0.63 01/28/2022 0743   CREATININE 0.78 12/01/2013 1659   CREATININE 1.0 01/04/2013 1212   CALCIUM 7.7 (L) 01/28/2022 0743   CALCIUM 9.6 01/04/2013 1212   PROT 6.4 (L) 01/23/2022 2246   PROT 6.3 12/24/2020 1143   PROT 6.4 01/04/2013 1212   ALBUMIN 3.4 (L) 01/23/2022 2246   ALBUMIN  4.2 12/24/2020 1143   ALBUMIN 3.6 01/04/2013 1212   AST 26 01/23/2022 2246   AST 14 01/04/2013 1212   ALT 29 01/23/2022 2246   ALT 16 01/04/2013 1212   ALKPHOS 95 01/23/2022 2246   ALKPHOS 52 01/04/2013 1212   BILITOT 0.4 01/23/2022 2246   BILITOT <0.2 12/24/2020 1143   BILITOT 1.01 01/04/2013 1212   GFRNONAA >60 01/28/2022 0743   GFRAA >60 01/02/2020 0455   Lipase     Component Value Date/Time   LIPASE 44 01/23/2022 2246       Studies/Results: DG Abd 2 Views  Result Date: 01/27/2022 CLINICAL DATA:  Right lower quadrant pain, follow-up small bowel obstruction EXAM: ABDOMEN - 2 VIEW COMPARISON:  01/25/2022 FINDINGS: Gas-filled, although nondistended loops of small bowel scattered throughout the abdomen, largest caliber 3.5 cm. Scattered gas is present to the distal colon. No free air in the abdomen. Interval removal of previously esophagogastric tube. IMPRESSION: Gas-filled, although nondistended loops of small bowel scattered throughout the abdomen, largest caliber 3.5 cm. Scattered gas is present to the distal colon. No free air in the abdomen. Electronically Signed   By: Delanna Ahmadi M.D.   On: 01/27/2022 10:23    Anti-infectives: Anti-infectives (From admission, onward)  None        Assessment/Plan  pSBO, suspect related to adhesions - hx R hemicolectomy by Dr. Dema Severin 03/2020 for colon cancer - Distension improving, having regular bowel function - Advance to soft diet this morning   LOS: 4 days   I reviewed nursing notes, hospitalist notes, last 24 h vitals and pain scores, last 48 h intake and output, last 24 h labs and trends, and last 24 h imaging results.  Michaelle Birks, [facility] General, Hepatobiliary and Pancreatic Surgery 01/28/22 10:12 AM

## 2022-01-28 NOTE — Progress Notes (Addendum)
  Progress Note Patient: [name] NWG:[id number] DOB: 01/03/1930 DOA: 01/23/2022  DOS: the patient was seen and examined on 01/28/2022  Brief summary: PMH of type II DM, seizure disorder, colon cancer SP right hemicolectomy presented to hospital with complaints of abdominal pain and nausea found to have SBO  Significant studies: 9/22>> CT abdomen/pelvis: SBO, slight increase in cystic lesion within the tail of the pancreas.  Subjective: Having multiple BMs.  No abdominal pain.  Tolerating full liquids.  Objective: Blood pressure 130/68, pulse 83, temperature 98.9 F (37.2 C), temperature source Oral, resp. rate 15, height 5' 7.5" (1.715 m), weight 65.4 kg, SpO2 96 %.   Gen Exam:Alert awake-not in any distress HEENT:atraumatic, normocephalic Chest: B/L clear to auscultation anteriorly CVS:S1S2 regular Abdomen:soft non tender, non distended Extremities:no edema Neurology: Non focal Skin: no rash   Assessment and Plan: SBO (small bowel obstruction)  Improved-NG tube removed 9/25-tolerating advancement in diet-having BMs.  And to soft diet today.  Mobilize with rehab services today.  Await further recommendations from general surgery.  Uncontrolled type 2 diabetes mellitus with hyperglycemia, with long-term current use of insulin (A1c 6.4 on 9/23) CBGs stable-continue SSI.  Recent Labs    01/27/22 2331 01/28/22 0341 01/28/22 0758  GLUCAP 178* 172* 227*     Seizure disorder  Continue phenytoin-no breakthrough seizures.     Acquired hypothyroidism Continue Synthroid.    Hyponatremia  Mild-euvolemic on exam-saline lock all IV fluids-give 1 dose of IV Lasix.  Recheck tomorrow.  HTN Resume verapamil-at half of home dose.  Essential tremor Resume primidone  Chronic intermittent diarrhea/constipation: Longstanding/chronic issue-patient apparently takes colestyramine for diarrhea-and when she gets constipation she takes a laxative.  Apparently used to follow with Dr.  Leia Alf apparently undergone extensive work-up in the past.  Suspect she has IBS.  Cystic lesion seen within the tail of the pancreas: Incidental finding on CT imaging done for SBO-we will need outpatient follow-up with PCP/MRI in the outpatient setting  Data Reviewed:    Latest Ref Rng & Units 01/28/2022    7:43 AM 01/26/2022    9:38 AM 01/25/2022    7:48 AM  CBC  WBC 4.0 - 10.5 K/uL 6.0  13.5  9.7   Hemoglobin 12.0 - 15.0 g/dL 10.6  12.3  12.2   Hematocrit 36.0 - 46.0 % 30.7  37.1  35.6   Platelets 150 - 400 K/uL 300  382  333        Latest Ref Rng & Units 01/28/2022    7:43 AM 01/26/2022    9:38 AM 01/25/2022    7:48 AM  BMP  Glucose 70 - 99 mg/dL 178  186  157   BUN 8 - 23 mg/dL '5  10  8   '$ Creatinine 0.44 - 1.00 mg/dL 0.63  0.98  0.74   Sodium 135 - 145 mmol/L 129  139  134   Potassium 3.5 - 5.1 mmol/L 3.5  3.5  3.5   Chloride 98 - 111 mmol/L 98  107  105   CO2 22 - 32 mmol/L '23  17  21   '$ Calcium 8.9 - 10.3 mg/dL 7.7  8.3  8.0      Family Communication: Niece-[phone]-updated over the phone 9/27  Disposition: Status is: Inpatient Remains inpatient appropriate because: Diet being advanced-mobilize with PT-if improved-Home 9/28 if improved.  Author: Oren Binet, MD 01/28/2022 9:59 AM  Please look on [email or web address] to find out who is on call.

## 2022-01-29 DIAGNOSIS — E1165 Type 2 diabetes mellitus with hyperglycemia: Secondary | ICD-10-CM | POA: Diagnosis not present

## 2022-01-29 DIAGNOSIS — E871 Hypo-osmolality and hyponatremia: Secondary | ICD-10-CM | POA: Diagnosis not present

## 2022-01-29 DIAGNOSIS — G250 Essential tremor: Secondary | ICD-10-CM | POA: Diagnosis not present

## 2022-01-29 DIAGNOSIS — D649 Anemia, unspecified: Secondary | ICD-10-CM | POA: Diagnosis not present

## 2022-01-29 DIAGNOSIS — K56699 Other intestinal obstruction unspecified as to partial versus complete obstruction: Secondary | ICD-10-CM | POA: Diagnosis not present

## 2022-01-29 DIAGNOSIS — R2681 Unsteadiness on feet: Secondary | ICD-10-CM | POA: Diagnosis not present

## 2022-01-29 DIAGNOSIS — R197 Diarrhea, unspecified: Secondary | ICD-10-CM | POA: Diagnosis not present

## 2022-01-29 DIAGNOSIS — E039 Hypothyroidism, unspecified: Secondary | ICD-10-CM | POA: Diagnosis not present

## 2022-01-29 DIAGNOSIS — E876 Hypokalemia: Secondary | ICD-10-CM | POA: Diagnosis not present

## 2022-01-29 DIAGNOSIS — R531 Weakness: Secondary | ICD-10-CM | POA: Diagnosis not present

## 2022-01-29 DIAGNOSIS — T420X5D Adverse effect of hydantoin derivatives, subsequent encounter: Secondary | ICD-10-CM | POA: Diagnosis not present

## 2022-01-29 DIAGNOSIS — I5189 Other ill-defined heart diseases: Secondary | ICD-10-CM | POA: Diagnosis not present

## 2022-01-29 DIAGNOSIS — E119 Type 2 diabetes mellitus without complications: Secondary | ICD-10-CM

## 2022-01-29 DIAGNOSIS — S22009D Unspecified fracture of unspecified thoracic vertebra, subsequent encounter for fracture with routine healing: Secondary | ICD-10-CM | POA: Diagnosis not present

## 2022-01-29 DIAGNOSIS — G40919 Epilepsy, unspecified, intractable, without status epilepticus: Secondary | ICD-10-CM | POA: Diagnosis not present

## 2022-01-29 DIAGNOSIS — F411 Generalized anxiety disorder: Secondary | ICD-10-CM | POA: Diagnosis not present

## 2022-01-29 DIAGNOSIS — E785 Hyperlipidemia, unspecified: Secondary | ICD-10-CM | POA: Diagnosis not present

## 2022-01-29 DIAGNOSIS — K56609 Unspecified intestinal obstruction, unspecified as to partial versus complete obstruction: Secondary | ICD-10-CM | POA: Diagnosis not present

## 2022-01-29 DIAGNOSIS — M6281 Muscle weakness (generalized): Secondary | ICD-10-CM | POA: Diagnosis not present

## 2022-01-29 DIAGNOSIS — E1140 Type 2 diabetes mellitus with diabetic neuropathy, unspecified: Secondary | ICD-10-CM | POA: Diagnosis not present

## 2022-01-29 DIAGNOSIS — C182 Malignant neoplasm of ascending colon: Secondary | ICD-10-CM | POA: Diagnosis not present

## 2022-01-29 DIAGNOSIS — Z9181 History of falling: Secondary | ICD-10-CM | POA: Diagnosis not present

## 2022-01-29 DIAGNOSIS — K219 Gastro-esophageal reflux disease without esophagitis: Secondary | ICD-10-CM | POA: Diagnosis not present

## 2022-01-29 DIAGNOSIS — Z794 Long term (current) use of insulin: Secondary | ICD-10-CM | POA: Diagnosis not present

## 2022-01-29 DIAGNOSIS — E118 Type 2 diabetes mellitus with unspecified complications: Secondary | ICD-10-CM | POA: Diagnosis not present

## 2022-01-29 DIAGNOSIS — E782 Mixed hyperlipidemia: Secondary | ICD-10-CM | POA: Diagnosis not present

## 2022-01-29 DIAGNOSIS — K5669 Other partial intestinal obstruction: Secondary | ICD-10-CM | POA: Diagnosis not present

## 2022-01-29 DIAGNOSIS — R2689 Other abnormalities of gait and mobility: Secondary | ICD-10-CM | POA: Diagnosis not present

## 2022-01-29 DIAGNOSIS — D62 Acute posthemorrhagic anemia: Secondary | ICD-10-CM | POA: Diagnosis not present

## 2022-01-29 DIAGNOSIS — Z7401 Bed confinement status: Secondary | ICD-10-CM | POA: Diagnosis not present

## 2022-01-29 DIAGNOSIS — I2510 Atherosclerotic heart disease of native coronary artery without angina pectoris: Secondary | ICD-10-CM | POA: Diagnosis not present

## 2022-01-29 DIAGNOSIS — R55 Syncope and collapse: Secondary | ICD-10-CM | POA: Diagnosis not present

## 2022-01-29 DIAGNOSIS — K5900 Constipation, unspecified: Secondary | ICD-10-CM | POA: Diagnosis not present

## 2022-01-29 DIAGNOSIS — R41841 Cognitive communication deficit: Secondary | ICD-10-CM | POA: Diagnosis not present

## 2022-01-29 DIAGNOSIS — I10 Essential (primary) hypertension: Secondary | ICD-10-CM | POA: Diagnosis not present

## 2022-01-29 DIAGNOSIS — G40909 Epilepsy, unspecified, not intractable, without status epilepticus: Secondary | ICD-10-CM | POA: Diagnosis not present

## 2022-01-29 LAB — BASIC METABOLIC PANEL
Anion gap: 8 (ref 5–15)
BUN: <5 mg/dL — ABNORMAL LOW (ref 8–23)
CO2: 26 mmol/L (ref 22–32)
Calcium: 7.9 mg/dL — ABNORMAL LOW (ref 8.9–10.3)
Chloride: 98 mmol/L (ref 98–111)
Creatinine, Ser: 0.56 mg/dL (ref 0.44–1.00)
GFR, Estimated: >60 mL/min (ref >60)
Glucose, Bld: 249 mg/dL — ABNORMAL HIGH (ref 70–99)
Potassium: 3 mmol/L — ABNORMAL LOW (ref 3.5–5.1)
Sodium: 132 mmol/L — ABNORMAL LOW (ref 135–145)

## 2022-01-29 LAB — GLUCOSE, CAPILLARY: Glucose-Capillary: 233 mg/dL — ABNORMAL HIGH (ref 70–99)

## 2022-01-29 MED ORDER — POTASSIUM CHLORIDE 20 MEQ PO PACK
40.0000 meq | PACK | Freq: Once | ORAL | Status: AC
  Administered 2022-01-29: 40 meq via ORAL
  Filled 2022-01-29: qty 2

## 2022-01-29 MED ORDER — TRAMADOL HCL 50 MG PO TABS: 50.0000 mg | ORAL_TABLET | Freq: Three times a day (TID) | ORAL | 0 refills | Status: DC | PRN

## 2022-01-29 MED ORDER — ALPRAZOLAM 0.25 MG PO TABS: 0.2500 mg | ORAL_TABLET | Freq: Two times a day (BID) | ORAL | 0 refills | Status: DC | PRN

## 2022-01-29 NOTE — TOC Initial Note (Addendum)
Transition of Care [facility]) - Initial/Assessment Note    Patient Details  Name: [name] MRN: [id number] Date of Birth: 1929-06-13  Transition of Care [facility]) CM/SW Contact:    Curlene Labrum, RN Phone Number: 01/29/2022, 10:24 AM  Clinical Narrative:                 CM met with the patient at the bedside and called the patient's niece, Emelda Fear on the phone to discuss transitions of care needs.  The patient currently has Home Health aide through [facility] on Wednesday and Thursday and family support on Monday, Tuesday and Weekends.  The patient's niece states that the patient will not have 24 hour supervision at home - and considering the patient will need physical therapy for strengthening - SNf placement is requested by the family since they are unable to stay with the patient overnight to insure safety in the home.  The patient states that she would rather return home but was agreeable to [facility] placement at [facility] at this time.  I called Nikki, CM at [facility] and she will check on bed availability at the facility but will likely have an available bed today.  FL2 completed and discharge summary uploaded in the hub for the facility to review.  The patient has 65 allergies and note was made on the [facility] that facility would be provided with full list of allergies since the list was extensive.  The patient and family are aware that I am coordinating patient's likely transfer to the facility today since patient has traditional Medicare and insurance authorization is not needed.  01/29/2022 1103 - I called Nikki, CM at the facility and she will call back with the available room number for admission.  I updated the patient and family regarding transition to the facility today by PTAR transport.  Bedside nursing - Please call report to [facility] at [phone] - Room 507.  PTAR packet to include discharge summary, face-sheet and medical necessity.  [facility]AR  called and patient arranged for transport to the facility.  Expected Discharge Plan: Skilled Nursing Facility Barriers to Discharge: No Barriers Identified   Patient Goals and CMS Choice Patient states their goals for this hospitalization and ongoing recovery are:: Agreeable to go to SNF at [facility].gov Compare Post Acute Care list provided to:: Patient Choice offered to / list presented to : Patient, [facility] POA / Guardian  Expected Discharge Plan and Services Expected Discharge Plan: [place]   Discharge Planning Services: CM Consult Post Acute Care Choice: [place] Living arrangements for the past 2 months: Single Family Home Expected Discharge Date: 01/29/22                                    Prior Living Arrangements/Services Living arrangements for the past 2 months: Single Family Home Lives with:: Self (Family and home health aide from [facility] provide assistance at home - otherwise alone at home overnight) Patient language and need for interpreter reviewed:: Yes Do you feel safe going back to the place where you live?: Yes      Need for Family Participation in Patient Care: Yes (Comment) Care giver support system in place?: Yes (comment) Current home services: DME, Homehealth aide (private pay aide through [facility]) Criminal Activity/Legal Involvement Pertinent to Current Situation/Hospitalization: No - Comment as  needed  Activities of Daily Living Home Assistive Devices/Equipment: Gilford Rile (specify type) ADL Screening (condition at time of admission) Patient's cognitive ability adequate to safely complete daily activities?: Yes Is the patient deaf or have difficulty hearing?: No Does the patient have difficulty seeing, even when wearing glasses/contacts?: No Does the patient have difficulty concentrating, remembering, or making decisions?: No Patient able to express need for assistance with ADLs?:  Yes Does the patient have difficulty dressing or bathing?: Yes Independently performs ADLs?: No Communication: Independent Dressing (OT): Needs assistance Is this a change from baseline?: Pre-admission baseline Grooming: Independent Feeding: Independent Bathing: Needs assistance Is this a change from baseline?: Pre-admission baseline Toileting: Independent In/Out Bed: Needs assistance Is this a change from baseline?: Pre-admission baseline Walks in Home: Independent with device (comment) Does the patient have difficulty walking or climbing stairs?: Yes Weakness of Legs: None Weakness of Arms/Hands: None  Permission Sought/Granted Permission sought to share information with : Case Manager, Family Supports, Chartered certified accountant granted to share information with : Yes, Verbal Permission Granted     Permission granted to share info w AGENCY: [place] granted to share info w Relationship: Niece -     Emotional Assessment Appearance:: Appears stated age Attitude/Demeanor/Rapport: Gracious Affect (typically observed): Accepting Orientation: : Oriented to Self, Oriented to Place, Oriented to  Time, Oriented to Situation Alcohol / Substance Use: Not Applicable Psych Involvement: No (comment)  Admission diagnosis:  Small bowel obstruction ([place]) [K56.609] SBO (small bowel obstruction) ([place]) [K56.609] Patient Active Problem List   Diagnosis Date Noted   SBO (small bowel obstruction) ([place]) 01/24/2022   Osteopenia 10/06/2021   Family history of malignant neoplasm of digestive organs 10/06/2021   History of malignant neoplasm of colon 10/06/2021   Hx of compression fracture of spine 10/06/2021   Intestinal malabsorption 10/06/2021   Pharyngeal dysphagia 10/06/2021   Carpal tunnel syndrome of left wrist 09/22/2021   CAP (community acquired pneumonia) 04/03/2021   Numbness of hand 01/13/2021   DDD (degenerative disc disease), cervical 12/03/2020    Compression fracture of body of thoracic vertebra ([place]) 08/21/2020   Dilantin toxicity 08/17/2020   Fracture of neck of femur ([place]) 07/11/2020   Postoperative anemia due to acute blood loss 06/06/2020   Leukocytosis    Closed right hip fracture ([place]) 06/04/2020   Constipation 05/27/2020   Dyslipidemia 05/27/2020   Enterocolitis due to Clostridium difficile, not specified as recurrent 05/27/2020   Pleural effusion on right 04/01/2020   Acute respiratory failure with hypoxia ([place]) 04/01/2020   Pneumonia of right lung due to infectious organism 04/01/2020   Uncontrolled type 2 diabetes mellitus with hyperglycemia, with long-term current use of insulin ([place]) 04/01/2020   Abnormal CT of the chest 04/01/2020   Subacute pulmonary embolism ([place]) 35/32/9924   Acute metabolic encephalopathy 26/83/4196   Adenocarcinoma of colon ([place]) 04/01/2020   Nausea & vomiting 03/12/2020   Cancer of ascending colon pT3pN0 (0/12 LN) s/p lap right colectomy 03/06/2020 03/09/2020   History of multiple allergies 03/09/2020   S/P right hemicolectomy 03/06/2020   Closed fracture of shaft of tibia 02/12/2020   Abnormal feces 12/11/2019   Preop cardiovascular exam 12/11/2019   Gait abnormality 12/04/2019   Impaired left ventricular function 09/05/2019   Pain in joint of left shoulder 02/27/2019   Hypokalemia    Effusion of left knee joint    Hyponatremia    Left patella fracture 10/26/2018   Fracture of patella 10/26/2018   Pain in left knee 09/29/2018  Acquired hypothyroidism 08/25/2017   H/O excision of tumor of brain meninges 08/25/2017   Hammer toe 08/25/2017   History of breast cancer 08/25/2017   Nontoxic thyroid nodule 08/25/2017   Osteopetrosis 08/25/2017   Age-related osteoporosis without current pathological fracture 08/25/2017   Dysuria 01/19/2017   Vitamin D deficiency 11/18/2016   Tremor, essential 08/18/2016   Closed burst fracture of lumbar vertebra ([place]) 11/25/2015   Seizure disorder  ([place]) 10/16/2014   Chronic low back pain 12/21/2013   Cerebral meningioma ([place]) 12/21/2013   Brain tumor (benign) ([place]) 10/18/2013   Subdural hemorrhage ([place]) 10/09/2013   Anxiety 10/09/2013   Compression fracture of T12 vertebra ([place]) 10/09/2013   Nontoxic multinodular goiter 09/05/2013   Syncope 08/27/2013   Carotid artery disease ([place]) 08/27/2013   Abnormal thyroid ultrasound 08/27/2013   Cancer of central portion of female breast ([place]) 12/30/2012   Osteoporosis 03/06/2010   ASTHMA 03/05/2009   Asthma 03/05/2009   IRRITABLE BOWEL SYNDROME  diarrhea type 03/21/2008   ANEMIA-NOS 12/16/2006   GERD 12/16/2006   Insulin-requiring or dependent type II diabetes mellitus ([place]) 10/29/2006   Mixed hyperlipidemia 10/29/2006   Essential hypertension 10/29/2006   Allergic rhinitis, cause unspecified 10/29/2006   OSTEOARTHRITIS 10/29/2006   PCP:  Bartholome Bill, MD Pharmacy:   [facility], [place] [address] Phone: [phone] Fax: [phone]     Social Determinants of Health (SDOH) Interventions    Readmission Risk Interventions    01/29/2022   10:17 AM 06/06/2020    2:30 PM 03/15/2020   10:55 AM  Readmission Risk Prevention Plan  Transportation Screening Complete Complete Complete  PCP or Specialist Appt within 3-5 Days Complete  Complete  HRI or [place] Complete    Social Work Consult for [place] Planning/Counseling Complete  Complete  Palliative Care Screening Complete  Complete  Medication Review Press photographer) Complete  Complete  PCP or Specialist appointment within 3-5 days of discharge  Complete   SW Recovery Care/Counseling Consult  Complete   Palliative Care Screening  Not Applicable

## 2022-01-29 NOTE — NC FL2 (Signed)
[place] LEVEL OF CARE SCREENING TOOL     IDENTIFICATION  Patient Name: [name] Birthdate: Jun 20, 1929 Sex: female Admission Date (Current Location): 01/23/2022  [facility] and [place] Number:  Herbalist and Address:  [facility], [place] [address] [place], [place] [address]      Provider Number: [id number]  Attending Physician Name and Address:  Jonetta Osgood, MD  Relative Name and Phone Number:  Emelda Fear, Niece - [phone]    Current Level of Care: Hospital Recommended Level of Care: [place] Prior Approval Number:    Date Approved/Denied:   PASRR Number: [id number]  Discharge Plan: SNF    Current Diagnoses: Patient Active Problem List   Diagnosis Date Noted   SBO (small bowel obstruction) ([place]) 01/24/2022   Osteopenia 10/06/2021   Family history of malignant neoplasm of digestive organs 10/06/2021   History of malignant neoplasm of colon 10/06/2021   Hx of compression fracture of spine 10/06/2021   Intestinal malabsorption 10/06/2021   Pharyngeal dysphagia 10/06/2021   Carpal tunnel syndrome of left wrist 09/22/2021   CAP (community acquired pneumonia) 04/03/2021   Numbness of hand 01/13/2021   DDD (degenerative disc disease), cervical 12/03/2020   Compression fracture of body of thoracic vertebra ([place]) 08/21/2020   Dilantin toxicity 08/17/2020   Fracture of neck of femur ([place]) 07/11/2020   Postoperative anemia due to acute blood loss 06/06/2020   Leukocytosis    Closed right hip fracture ([place]) 06/04/2020   Constipation 05/27/2020   Dyslipidemia 05/27/2020   Enterocolitis due to Clostridium difficile, not specified as recurrent 05/27/2020   Pleural effusion on right 04/01/2020   Acute respiratory failure with hypoxia ([place]) 04/01/2020   Pneumonia of right lung due to infectious organism 04/01/2020   Uncontrolled type 2 diabetes mellitus with hyperglycemia, with long-term current  use of insulin ([place]) 04/01/2020   Abnormal CT of the chest 04/01/2020   Subacute pulmonary embolism ([place]) 51/76/1607   Acute metabolic encephalopathy 37/02/6268   Adenocarcinoma of colon ([place]) 04/01/2020   Nausea & vomiting 03/12/2020   Cancer of ascending colon pT3pN0 (0/12 LN) s/p lap right colectomy 03/06/2020 03/09/2020   History of multiple allergies 03/09/2020   S/P right hemicolectomy 03/06/2020   Closed fracture of shaft of tibia 02/12/2020   Abnormal feces 12/11/2019   Preop cardiovascular exam 12/11/2019   Gait abnormality 12/04/2019   Impaired left ventricular function 09/05/2019   Pain in joint of left shoulder 02/27/2019   Hypokalemia    Effusion of left knee joint    Hyponatremia    Left patella fracture 10/26/2018   Fracture of patella 10/26/2018   Pain in left knee 09/29/2018   Acquired hypothyroidism 08/25/2017   H/O excision of tumor of brain meninges 08/25/2017   Hammer toe 08/25/2017   History of breast cancer 08/25/2017   Nontoxic thyroid nodule 08/25/2017   Osteopetrosis 08/25/2017   Age-related osteoporosis without current pathological fracture 08/25/2017   Dysuria 01/19/2017   Vitamin D deficiency 11/18/2016   Tremor, essential 08/18/2016   Closed burst fracture of lumbar vertebra ([place]) 11/25/2015   Seizure disorder ([place]) 10/16/2014   Chronic low back pain 12/21/2013   Cerebral meningioma ([place]) 12/21/2013   Brain tumor (benign) ([place]) 10/18/2013   Subdural hemorrhage ([place]) 10/09/2013   Anxiety 10/09/2013   Compression fracture of T12 vertebra ([place]) 10/09/2013   Nontoxic multinodular goiter 09/05/2013   Syncope 08/27/2013   Carotid artery disease ([place]) 08/27/2013   Abnormal thyroid  ultrasound 08/27/2013   Cancer of central portion of female breast ([place]) 12/30/2012   Osteoporosis 03/06/2010   ASTHMA 03/05/2009   Asthma 03/05/2009   IRRITABLE BOWEL SYNDROME  diarrhea type 03/21/2008   ANEMIA-NOS 12/16/2006   GERD 12/16/2006   Insulin-requiring or  dependent type II diabetes mellitus ([place]) 10/29/2006   Mixed hyperlipidemia 10/29/2006   Essential hypertension 10/29/2006   Allergic rhinitis, cause unspecified 10/29/2006   OSTEOARTHRITIS 10/29/2006    Orientation RESPIRATION BLADDER Height & Weight     Self, Time, Situation, Place  Normal Continent, External catheter Weight: 65.5 kg Height:  5' 7.5" (171.5 cm)  BEHAVIORAL SYMPTOMS/MOOD NEUROLOGICAL BOWEL NUTRITION STATUS      Continent Diet  AMBULATORY STATUS COMMUNICATION OF NEEDS Skin   Supervision Verbally Normal                       Personal Care Assistance Level of Assistance  Bathing, Feeding, Dressing Bathing Assistance: Limited assistance Feeding assistance: Independent Dressing Assistance: Limited assistance     Functional Limitations Info             SPECIAL CARE FACTORS FREQUENCY  PT (By licensed PT), OT (By licensed OT)     PT Frequency: 3-5 x per week OT Frequency: 3-5 x per week            Contractures Contractures Info: Not present    Additional Factors Info  Allergies, Code Status Code Status Info: Full code Allergies Info: Extensive Allergy List of 65 Allergies - Please See Discharge Summary for complete list of Allergies           Current Medications (01/29/2022):  This is the current hospital active medication list Current Facility-Administered Medications  Medication Dose Route Frequency Provider Last Rate Last Admin   ALPRAZolam Duanne Moron) tablet 0.25 mg  0.25 mg Oral BID PRN Jonetta Osgood, MD       enoxaparin (LOVENOX) injection 30 mg  30 mg Subcutaneous Q24H Elgergawy, Silver Huguenin, MD   30 mg at 01/28/22 1704   fentaNYL (SUBLIMAZE) injection 25-50 mcg  25-50 mcg Intravenous Q2H PRN Opyd, Ilene Qua, MD   25 mcg at 01/26/22 1157   insulin aspart (novoLOG) injection 0-9 Units  0-9 Units Subcutaneous TID WC Jonetta Osgood, MD   3 Units at 01/29/22 0818   levothyroxine (SYNTHROID) tablet 75 mcg  75 mcg Oral QAC breakfast  Jonetta Osgood, MD   75 mcg at 01/29/22 0554   lidocaine (XYLOCAINE) 2 % viscous mouth solution 15 mL  15 mL Mouth/Throat Q4H PRN Lavina Hamman, MD       lip balm (CARMEX) ointment   Topical PRN Lavina Hamman, MD       LORazepam (ATIVAN) injection 0.5 mg  0.5 mg Intravenous Q6H PRN Lavina Hamman, MD   0.5 mg at 01/25/22 1248   melatonin tablet 3 mg  3 mg Oral QHS Elgergawy, Silver Huguenin, MD   3 mg at 01/28/22 2024   menthol-cetylpyridinium (CEPACOL) lozenge 3 mg  1 lozenge Oral PRN Lavina Hamman, MD   3 mg at 01/26/22 1134   ondansetron (ZOFRAN) tablet 4 mg  4 mg Oral Q6H PRN Opyd, Ilene Qua, MD       Or   ondansetron (ZOFRAN) injection 4 mg  4 mg Intravenous Q6H PRN Opyd, Ilene Qua, MD   4 mg at 01/26/22 0450   pantoprazole (PROTONIX) EC tablet 40 mg  40 mg Oral Daily Ghimire, Shanker  M, MD   40 mg at 01/29/22 0818   phenol (CHLORASEPTIC) mouth spray 1 spray  1 spray Mouth/Throat PRN Lavina Hamman, MD       phenytoin (DILANTIN) ER capsule 300 mg  300 mg Oral QHS Elgergawy, Silver Huguenin, MD   300 mg at 01/28/22 2028   And   phenytoin (DILANTIN) chewable tablet 50 mg  50 mg Oral QHS Elgergawy, Silver Huguenin, MD   50 mg at 01/28/22 2026   potassium chloride (KLOR-CON) packet 40 mEq  40 mEq Oral Once Jonetta Osgood, MD       primidone (MYSOLINE) tablet 250 mg  250 mg Oral QHS Jonetta Osgood, MD   250 mg at 01/28/22 2026   sodium chloride (OCEAN) 0.65 % nasal spray 1 spray  1 spray Each Nare PRN Lavina Hamman, MD       verapamil (CALAN-SR) CR tablet 120 mg  120 mg Oral Daily Jonetta Osgood, MD   120 mg at 01/29/22 0818     Discharge Medications: Please see discharge summary for a list of discharge medications.  Relevant Imaging Results:  Relevant Lab Results:   Additional Information SSN: [place], RN

## 2022-01-29 NOTE — Progress Notes (Signed)
[facility] Progress Note     Subjective: Reports mild ongoing abd discomfort, significantly improved compared to when she got to the hospital. Reports small amt flatus and a BM last night. Tolerating some PO but not eating much - states her breakfast was cold and she was not going eat cold pancakes.  Objective: Vital signs in last 24 hours: Temp:  [98.6 F (37 C)-99.2 F (37.3 C)] 99.2 F (37.3 C) (09/28 0747) Pulse Rate:  [76-82] 76 (09/28 0747) Resp:  [16-18] 16 (09/28 0747) BP: (141-150)/(72-86) 150/79 (09/28 0747) SpO2:  [96 %-100 %] 97 % (09/28 0747) Weight:  [65.5 kg] 65.5 kg (09/28 0428) Last BM Date : 01/27/22  Intake/Output from previous day: 09/27 0701 - 09/28 0700 In: 560 [P.O.:560] Out: 1600 [Urine:1600] Intake/Output this shift: No intake/output data recorded.  PE: Gen:  Alert, NAD, pleasant Pulm:  Normal work of breathing on room air Abd: Mildly distended, Nontender to palpation. Well-healed surgical scar.  Skin: warm and dry, no rashes  Psych: A&Ox3   Lab Results:  Recent Labs    01/26/22 0938 01/28/22 0743  WBC 13.5* 6.0  HGB 12.3 10.6*  HCT 37.1 30.7*  PLT 382 300   BMET Recent Labs    01/28/22 0743 01/29/22 0628  NA 129* 132*  K 3.5 3.0*  CL 98 98  CO2 23 26  GLUCOSE 178* 249*  BUN <5* <5*  CREATININE 0.63 0.56  CALCIUM 7.7* 7.9*   PT/INR No results for input(s): "LABPROT", "INR" in the last 72 hours. CMP     Component Value Date/Time   NA 132 (L) 01/29/2022 0628   NA 130 (L) 12/24/2020 1143   NA 138 01/04/2013 1212   K 3.0 (L) 01/29/2022 0628   K 3.9 01/04/2013 1212   CL 98 01/29/2022 0628   CO2 26 01/29/2022 0628   CO2 22 01/04/2013 1212   GLUCOSE 249 (H) 01/29/2022 0628   GLUCOSE 285 (H) 01/04/2013 1212   BUN <5 (L) 01/29/2022 0628   BUN 14 12/24/2020 1143   BUN 10.3 01/04/2013 1212   CREATININE 0.56 01/29/2022 0628   CREATININE 0.78 12/01/2013 1659   CREATININE 1.0 01/04/2013 1212   CALCIUM 7.9 (L)  01/29/2022 0628   CALCIUM 9.6 01/04/2013 1212   PROT 6.4 (L) 01/23/2022 2246   PROT 6.3 12/24/2020 1143   PROT 6.4 01/04/2013 1212   ALBUMIN 3.4 (L) 01/23/2022 2246   ALBUMIN 4.2 12/24/2020 1143   ALBUMIN 3.6 01/04/2013 1212   AST 26 01/23/2022 2246   AST 14 01/04/2013 1212   ALT 29 01/23/2022 2246   ALT 16 01/04/2013 1212   ALKPHOS 95 01/23/2022 2246   ALKPHOS 52 01/04/2013 1212   BILITOT 0.4 01/23/2022 2246   BILITOT <0.2 12/24/2020 1143   BILITOT 1.01 01/04/2013 1212   GFRNONAA >60 01/29/2022 0628   GFRAA >60 01/02/2020 0455   Lipase     Component Value Date/Time   LIPASE 44 01/23/2022 2246       Studies/Results: No results found.  Anti-infectives: Anti-infectives (From admission, onward)    None        Assessment/Plan  pSBO, suspect related to adhesions - hx R hemicolectomy by Dr. Dema Severin 03/2020 for colon cancer - Distension improving, having regular bowel function, tolerating soft diet but with poor appetite, sounds like this is mostly because she does not like the food. - no emergent surgical needs. Clinically she is not obstructed or Tech have a mild partial obstruction. I think if she  is tolerating enough PO she could be discharged home. Continue to encourage protein shakes at discharge.   LOS: 5 days   I reviewed nursing notes, hospitalist notes, last 24 h vitals and pain scores, last 48 h intake and output, last 24 h labs and trends, and last 24 h imaging results.  Obie Dredge, PA-C [facility]  01/29/22 9:12 AM

## 2022-01-29 NOTE — Discharge Summary (Signed)
PATIENT DETAILS Name: [name] Age: 88 y.o. Sex: female Date of Birth: 13-Dec-1929 MRN: [id number]. Admitting Physician: Vianne Bulls, MD CMK:[id number], Dola Factor, MD  Admit Date: 01/23/2022 Discharge date: 01/29/2022  Recommendations for Outpatient Follow-up:  Follow up with PCP in 1-2 weeks Please obtain CMP/CBC in one week Incidental finding on CT abdomen-cystic lesion in tail of pancreas-needs dedicated MRI pancreatic protocol in the outpatient setting when she recovers from acute illness.  Admitted From:  Home  Disposition: Skilled nursing facility   Discharge Condition: fair  CODE STATUS:   Code Status: Full Code   Diet recommendation:  Diet Order             Diet - low sodium heart healthy           Diet Carb Modified           DIET SOFT Room service appropriate? No; Fluid consistency: Thin  Diet effective now                    Brief Summary: PMH of type II DM, seizure disorder, colon cancer SP right hemicolectomy presented to hospital with complaints of abdominal pain and nausea found to have SBO   Significant studies: 9/22>> CT abdomen/pelvis: SBO, slight increase in cystic lesion within the tail of the pancreas.  Brief Hospital Course: SBO (small bowel obstruction)  Improved with conservative measures.NG tube removed 9/25-tolerating advancement in diet-having BMs.  Tolerating regular diet.  Appreciate general surgery input.  Mobilize with therapy-with plans to discharge to SNF today.     Uncontrolled type 2 diabetes mellitus with hyperglycemia, with long-term current use of insulin (A1c 6.4 on 9/23) CBGs stable-but slowly creeping up-plan is to resume Lantus 10 units daily-continue SSI.  Will defer further optimization to the outpatient setting.  Seizure disorder  Continue phenytoin-no breakthrough seizures.     Acquired hypothyroidism Continue Synthroid.     Hyponatremia Likely SIADH-resolved after 1 dose of  Lasix.  Hypokalemia Replete prior to discharge-recheck in 1 week   HTN Resume verapamil   Essential tremor Resume primidone   Chronic intermittent diarrhea/constipation:  Longstanding/chronic issue-patient apparently takes colestyramine for diarrhea-and when she gets constipation she takes a laxative.  Apparently used to follow with Dr. Leia Alf apparently undergone extensive work-up in the past.  Suspect she has IBS.   Cystic lesion seen within the tail of the pancreas: Incidental finding on CT imaging done for SBO-we will need outpatient follow-up with PCP/MRI in the outpatient setting.  Note both patient/niece aware of this finding-with recommendations to pursue further work-up in the outpatient setting.   Debility/deconditioning: Due to acute illness-plans are for SNF on discharge.  BMI: Estimated body mass index is 22.28 kg/m as calculated from the following:   Height as of this encounter: 5' 7.5" (1.715 m).   Weight as of this encounter: 65.5 kg.    Discharge Diagnoses:  Principal Problem:   SBO (small bowel obstruction) (HCC) Active Problems:   Uncontrolled type 2 diabetes mellitus with hyperglycemia, with long-term current use of insulin (HCC)   Seizure disorder (HCC)   Acquired hypothyroidism   Discharge Instructions: Check CBGs before meals and at bedtime  Activity:  As tolerated with Full fall precautions use walker/cane & assistance as needed    Discharge Instructions     Call MD for:  persistant nausea and vomiting   Complete by: As directed    Call MD for:  severe uncontrolled pain   Complete by: As directed  Diet - low sodium heart healthy   Complete by: As directed    Diet Carb Modified   Complete by: As directed    Discharge instructions   Complete by: As directed    Follow with Primary MD  Bartholome Bill, MD in 1-2 weeks  Incidental finding-you have a cystic lesion in the pancreatic tail-you will need a dedicated MRI of the  pancreas in the next few weeks.  Please talk to your gastroenterologist or your primary care practitioner about this finding.  Please get a complete blood count and chemistry panel checked by your Primary MD at your next visit, and again as instructed by your Primary MD.  Get Medicines reviewed and adjusted: Please take all your medications with you for your next visit with your Primary MD  Laboratory/radiological data: Please request your Primary MD to go over all hospital tests and procedure/radiological results at the follow up, please ask your Primary MD to get all Hospital records sent to his/her office.  In some cases, they will be blood work, cultures and biopsy results pending at the time of your discharge. Please request that your primary care M.D. follows up on these results.  Also Note the following: If you experience worsening of your admission symptoms, develop shortness of breath, life threatening emergency, suicidal or homicidal thoughts you must seek medical attention immediately by calling 911 or calling your MD immediately  if symptoms less severe.  You must read complete instructions/literature along with all the possible adverse reactions/side effects for all the Medicines you take and that have been prescribed to you. Take any new Medicines after you have completely understood and accpet all the possible adverse reactions/side effects.   Do not drive when taking Pain medications or sleeping medications (Benzodaizepines)  Do not take more than prescribed Pain, Sleep and Anxiety Medications. It is not advisable to combine anxiety,sleep and pain medications without talking with your primary care practitioner  Special Instructions: If you have smoked or chewed Tobacco  in the last 2 yrs please stop smoking, stop any regular Alcohol  and or any Recreational drug use.  Wear Seat belts while driving.  Please note: You were cared for by a hospitalist during your hospital stay.  Once you are discharged, your primary care physician will handle any further medical issues. Please note that NO REFILLS for any discharge medications will be authorized once you are discharged, as it is imperative that you return to your primary care physician (or establish a relationship with a primary care physician if you do not have one) for your post hospital discharge needs so that they can reassess your need for medications and monitor your lab values.   Increase activity slowly   Complete by: As directed       Allergies as of 01/29/2022       Reactions   Bystolic [nebivolol Hcl] Other (See Comments)   "extreme weakness, heaviness in legs & arms, swelling in legs/arms/face, swollen abdomen, pain in bladder, feet pain, soreness in chest"   Carbamazepine Other (See Comments)   Blood poisoning   Cholestyramine Itching, Nausea And Vomiting, Rash, Other (See Comments)   "itching rash on stomach, bloated, nausea, vomiting, sleeplessness, extreme pain in arms"  Patient is taking this in 2022.   Morphine Other (See Comments)   Feels morbid, weak, still in pain   Niacin Palpitations   Fast heart beat Other reaction(s): Unknown   Niaspan [niacin Er] Palpitations, Other (See Comments)   "fast heart  beat, high blood pressure"   Norvasc [amlodipine Besylate] Other (See Comments)   "extreme fluid retention/pain)   Optivar [azelastine Hcl] Photosensitivity   Repaglinide Hives   Sular [nisoldipine Er] Other (See Comments)   "severe headaches, swelling eyes, hands, feet, shortness of breath, weak, flushed face, brain boiling, fluid retention, high blood sugar, nervous, heart fast beating"   Telmisartan Other (See Comments)   "headache, difficulty urinating, high blood sugar, fluid retention"   Amoxicillin-pot Clavulanate Rash   Tolerates Unasyn (>10 doses)   Cefdinir Swelling   Vaginal irritation, breathing,    Ciprofloxacin Hcl Hives   Clonidine Other (See Comments)   Dry mouth, fluid  retention   Clonidine Hydrochloride Other (See Comments)   Dry mouth, fluid retention   Codeine Nausea And Vomiting   Ezetimibe Other (See Comments)   Made weak   Hydroxychloroquine Other (See Comments)   Low platlets   Naproxen Other (See Comments)   Shrinks bladder   Sulfa Antibiotics Rash   Ziac [bisoprolol-hydrochlorothiazide] Other (See Comments)   "stopped urination"   Amlodipine Besy-benazepril Hcl Cough   Azelastine Other (See Comments)   Unknown reaction - Per MAR   Elavil [amitriptyline] Other (See Comments)   Gave Pt nightmares   Empagliflozin Other (See Comments)   "Caused yeast infection, slowed my urine"   Hydralazine Other (See Comments)   "does not reduce high blood pressure, pain in arm, high pressure, felt like I was on verge of heart attack, really weak"   Iodine I 131 Tositumomab Other (See Comments)   Unknown reaction - Per MAR   Kenalog [triamcinolone] Diarrhea   Per MAR   Keppra [levetiracetam] Other (See Comments)   Shaking   Lamotrigine Itching, Other (See Comments)   Misc. Sulfonamide Containing Compounds    Pregabalin Swelling, Other (See Comments)   Weight gain   Pseudoephedrine Other (See Comments)   Unknown reaction   Pseudoephedrine-guaifenesin Er Other (See Comments)   Unknown reaction - Per MAR   Risedronate    Unknown reaction - Per MAR   Ru-hist D [brompheniramine-phenylephrine] Other (See Comments)   Unknown reaction - Per MAR   Sumatriptan Other (See Comments)   Topiramate Other (See Comments)   Dry eyes   Ace Inhibitors Other (See Comments)   unknown   Actonel [risedronate Sodium] Other (See Comments)   unknown   Aspirin Other (See Comments)   Unknown reaction   Atacand [candesartan] Other (See Comments)   Unknown reaction - MAR   Bextra [valdecoxib] Other (See Comments)   Unknown reaction - Per MAR   Bisoprolol-hydrochlorothiazide Other (See Comments)   Unknown reaction - Per MAR   Cefadroxil Other (See Comments)   Unknown  reaction   Celecoxib Rash   Hydrocodone Other (See Comments)   unknown   Hydrocodone-acetaminophen Other (See Comments)   unknown   Iodinated Contrast Media Rash, Other (See Comments)   "All over" rash   Meloxicam Other (See Comments)   unknown   Methylprednisolone Sodium Succinate Other (See Comments)   unknown   Nabumetone Other (See Comments)   Unknown reaction   Penicillins Other (See Comments)   unknown   Pseudoephedrine-guaifenesin Other (See Comments)   unknown   Risedronate Sodium Other (See Comments)   unknown   Rofecoxib Other (See Comments)   Unknown reaction - MAR Other reaction(s): Unknown   Ru-tuss [chlorphen-pse-atrop-hyos-scop] Other (See Comments)   unknown   Sulfonamide Derivatives Other (See Comments)   unknown   Sulphur [elemental Sulfur] Other (See Comments)  unknown   Telithromycin Other (See Comments)   unknown   Terfenadine Other (See Comments)   Unknown reaction - Per MAR   Trandolapril-verapamil Hcl Er Other (See Comments)   Headache, difficulty urinating, high blood sugar, fluid retention Pt is taking Tarka (trandolapril-verapamil) currently, but requests the medication stay in her allergy list   Trandolapril-verapamil Hcl Er Other (See Comments)   Headache, difficulty urinating, high blood sugar, fluid retention Pt is taking Tarka (trandolapril-verapamil) currently, but requests the medication stay in her allergy list   Valium [diazepam] Other (See Comments)   Makes her mean and hyper        Medication List     STOP taking these medications    benzonatate 200 MG capsule Commonly known as: TESSALON       TAKE these medications    acetaminophen 500 MG tablet Commonly known as: TYLENOL Take 2 tablets (1,000 mg total) by mouth 3 (three) times daily. What changed:  when to take this reasons to take this   ALPRAZolam 0.25 MG tablet Commonly known as: XANAX Take 1 tablet (0.25 mg total) by mouth 2 (two) times daily as needed for  anxiety.   Biotin 5000 MCG Caps Take 5,000 mcg by mouth 2 (two) times daily.   capsicum 0.075 % topical cream Commonly known as: ZOSTRIX Apply 1 Application topically 4 (four) times daily as needed (pain).   cholestyramine 4 g packet Commonly known as: QUESTRAN Take 4 g by mouth 2 (two) times daily.   cyanocobalamin 1000 MCG/ML injection Commonly known as: VITAMIN B12 Inject 1,000 mcg into the muscle every 21 ( twenty-one) days.   diclofenac Sodium 1 % Gel Commonly known as: VOLTAREN Apply 2 g topically 4 (four) times daily as needed (for knee and back pain).   diphenhydrAMINE 25 mg capsule Commonly known as: BENADRYL Take 25 mg by mouth every 8 (eight) hours as needed for allergies or sleep.   feeding supplement (GLUCERNA SHAKE) Liqd Take 237 mLs by mouth daily as needed (for poor appetite).   Glucose Meter Test test strip Generic drug: glucose blood See admin instructions.   PRODIGY VOICE BLOOD GLUCOSE VI   insulin aspart 100 UNIT/ML FlexPen Commonly known as: NOVOLOG Inject 2-12 Units into the skin See admin instructions. Injects 2-10 units of insulin under the skin 3 times a day per sliding scale: CBG 0-150: 2 units; CBG 151-200: 4 units; CBG 201-250: 6 units; CBG 251-300: 8 units; CBG 301-350: 10 units; CBG 351-400: 12 units; CBG >400: 12 units and notify the MD/NP What changed: additional instructions   Insulin Pen Needle 32G X 4 MM Misc Used to inject insulin 3x daily What changed:  how much to take how to take this when to take this   Lantus SoloStar 100 UNIT/ML Solostar Pen Generic drug: insulin glargine Inject 10 Units into the skin at bedtime. What changed:  when to take this additional instructions   levothyroxine 75 MCG tablet Commonly known as: SYNTHROID Take 1 tablet (75 mcg total) by mouth daily before breakfast.   pantoprazole 40 MG tablet Commonly known as: PROTONIX Take 40 mg by mouth daily.   phenytoin 300 MG ER capsule Commonly known  as: DILANTIN Take 1 capsule (300 mg total) by mouth at bedtime.   phenytoin 50 MG tablet Commonly known as: DILANTIN Chew 1 tablet (50 mg total) by mouth daily.   polyethylene glycol 17 g packet Commonly known as: MIRALAX / GLYCOLAX Take 17 g by mouth daily as needed for  mild constipation.   primidone 250 MG tablet Commonly known as: MYSOLINE Take 250 mg by mouth at bedtime.   Prodigy Voice Blood Glucose w/Device Kit Use to check blood sugar 1 time per day. What changed:  how much to take how to take this when to take this   Systane 0.4-0.3 % Soln Generic drug: Polyethyl Glycol-Propyl Glycol Place 2 drops into both eyes 3 (three) times daily.   thiamine 250 MG tablet Take 250 mg by mouth daily.   traMADol 50 MG tablet Commonly known as: ULTRAM Take 1 tablet (50 mg total) by mouth every 8 (eight) hours as needed for moderate pain.   Verapamil HCl CR 200 MG Cp24 Take 200 mg by mouth daily.   Vitamin D-3 125 MCG (5000 UT) Tabs Take 5,000 Units by mouth daily.        Follow-up Information     Bartholome Bill, MD .   Specialty: Family Medicine Why: You have a lesion in the tail of the pancreas-please ask your primary care practitioner regarding further tests. Contact information: [address] [place] [address] [phone]         Gastroenterology, [facility]. Schedule an appointment as soon as possible for a visit in 2 week(s).   Why: You have a lesion in the tail of the pancreas-please ask your gastroenterologist regarding further tests. Contact information: [address] [place] [address] [phone]                Allergies  Allergen Reactions   Bystolic [Nebivolol Hcl] Other (See Comments)    "extreme weakness, heaviness in legs & arms, swelling in legs/arms/face, swollen abdomen, pain in bladder, feet pain, soreness in chest"   Carbamazepine Other (See Comments)    Blood poisoning   Cholestyramine  Itching, Nausea And Vomiting, Rash and Other (See Comments)    "itching rash on stomach, bloated, nausea, vomiting, sleeplessness, extreme pain in arms"  Patient is taking this in 2022.   Morphine Other (See Comments)    Feels morbid, weak, still in pain   Niacin Palpitations    Fast heart beat Other reaction(s): Unknown   Niaspan [Niacin Er] Palpitations and Other (See Comments)    "fast heart beat, high blood pressure"   Norvasc [Amlodipine Besylate] Other (See Comments)    "extreme fluid retention/pain)   Optivar [Azelastine Hcl] Photosensitivity   Repaglinide Hives   Sular [Nisoldipine Er] Other (See Comments)    "severe headaches, swelling eyes, hands, feet, shortness of breath, weak, flushed face, brain boiling, fluid retention, high blood sugar, nervous, heart fast beating"   Telmisartan Other (See Comments)    "headache, difficulty urinating, high blood sugar, fluid retention"   Amoxicillin-Pot Clavulanate Rash    Tolerates Unasyn (>10 doses)   Cefdinir Swelling    Vaginal irritation, breathing,    Ciprofloxacin Hcl Hives   Clonidine Other (See Comments)    Dry mouth, fluid retention   Clonidine Hydrochloride Other (See Comments)    Dry mouth, fluid retention   Codeine Nausea And Vomiting   Ezetimibe Other (See Comments)    Made weak   Hydroxychloroquine Other (See Comments)    Low platlets   Naproxen Other (See Comments)    Shrinks bladder   Sulfa Antibiotics Rash   Ziac [Bisoprolol-Hydrochlorothiazide] Other (See Comments)    "stopped urination"   Amlodipine Besy-Benazepril Hcl Cough   Azelastine Other (See Comments)    Unknown reaction - Per [facility]  Elavil [Amitriptyline] Other (See Comments)    Gave Pt nightmares   Empagliflozin Other (See Comments)    "Caused yeast infection, slowed my urine"   Hydralazine Other (See Comments)    "does not reduce high blood pressure, pain in arm, high pressure, felt like I was on verge of heart attack, really weak"   Iodine I  131 Tositumomab Other (See Comments)    Unknown reaction - Per MAR   Kenalog [Triamcinolone] Diarrhea    Per MAR   Keppra [Levetiracetam] Other (See Comments)    Shaking   Lamotrigine Itching and Other (See Comments)   Misc. Sulfonamide Containing Compounds    Pregabalin Swelling and Other (See Comments)    Weight gain   Pseudoephedrine Other (See Comments)    Unknown reaction   Pseudoephedrine-Guaifenesin Er Other (See Comments)    Unknown reaction - Per MAR   Risedronate     Unknown reaction - Per MAR   Ru-Hist D [Brompheniramine-Phenylephrine] Other (See Comments)    Unknown reaction - Per MAR   Sumatriptan Other (See Comments)   Topiramate Other (See Comments)    Dry eyes   Ace Inhibitors Other (See Comments)    unknown   Actonel [Risedronate Sodium] Other (See Comments)    unknown   Aspirin Other (See Comments)    Unknown reaction   Atacand [Candesartan] Other (See Comments)    Unknown reaction - MAR   Bextra [Valdecoxib] Other (See Comments)    Unknown reaction - Per MAR   Bisoprolol-Hydrochlorothiazide Other (See Comments)    Unknown reaction - Per MAR   Cefadroxil Other (See Comments)    Unknown reaction   Celecoxib Rash   Hydrocodone Other (See Comments)    unknown   Hydrocodone-Acetaminophen Other (See Comments)    unknown   Iodinated Contrast Media Rash and Other (See Comments)    "All over" rash   Meloxicam Other (See Comments)    unknown   Methylprednisolone Sodium Succinate Other (See Comments)    unknown   Nabumetone Other (See Comments)    Unknown reaction   Penicillins Other (See Comments)    unknown   Pseudoephedrine-Guaifenesin Other (See Comments)    unknown   Risedronate Sodium Other (See Comments)    unknown   Rofecoxib Other (See Comments)    Unknown reaction - MAR Other reaction(s): Unknown   Ru-Tuss [Chlorphen-Pse-Atrop-Hyos-Scop] Other (See Comments)    unknown   Sulfonamide Derivatives Other (See Comments)    unknown   Sulphur  [Elemental Sulfur] Other (See Comments)    unknown   Telithromycin Other (See Comments)    unknown   Terfenadine Other (See Comments)    Unknown reaction - Per MAR   Trandolapril-Verapamil Hcl Er Other (See Comments)    Headache, difficulty urinating, high blood sugar, fluid retention  Pt is taking Tarka (trandolapril-verapamil) currently, but requests the medication stay in her allergy list   Trandolapril-Verapamil Hcl Er Other (See Comments)    Headache, difficulty urinating, high blood sugar, fluid retention  Pt is taking Tarka (trandolapril-verapamil) currently, but requests the medication stay in her allergy list   Valium [Diazepam] Other (See Comments)    Makes her mean and hyper     Other Procedures/Studies: DG Abd 2 Views  Result Date: 01/27/2022 CLINICAL DATA:  Right lower quadrant pain, follow-up small bowel obstruction EXAM: ABDOMEN - 2 VIEW COMPARISON:  01/25/2022 FINDINGS: Gas-filled, although nondistended loops of small bowel scattered throughout the abdomen, largest caliber 3.5 cm. Scattered gas is present  to the distal colon. No free air in the abdomen. Interval removal of previously esophagogastric tube. IMPRESSION: Gas-filled, although nondistended loops of small bowel scattered throughout the abdomen, largest caliber 3.5 cm. Scattered gas is present to the distal colon. No free air in the abdomen. Electronically Signed   By: Delanna Ahmadi M.D.   On: 01/27/2022 10:23   DG Abd Portable 1V  Result Date: 01/25/2022 CLINICAL DATA:  NG tube placement EXAM: PORTABLE ABDOMEN - 1 VIEW COMPARISON:  01/24/2022 FINDINGS: NG tube tip is in the proximal stomach with the side port in the distal esophagus. IMPRESSION: NG tube tip in the proximal stomach. Electronically Signed   By: Rolm Baptise M.D.   On: 01/25/2022 19:25   DG Abd 2 Views  Result Date: 01/25/2022 CLINICAL DATA:  Small bowel obstruction. EXAM: ABDOMEN - 2 VIEW COMPARISON:  January 24, 2022 FINDINGS: Persistent mild  dilation of small bowel loops with maximum diameter of 3.7 cm. Enteric catheter is with side hole likely in the distal esophagus. Adjustment Alwin be considered. No free gas seen. IMPRESSION: 1. Persistent mild dilation of small bowel loops. 2. Enteric catheter is with side hole likely in the distal esophagus. Adjustment [place] be considered. 3. No free gas seen. Electronically Signed   By: Fidela Salisbury M.D.   On: 01/25/2022 13:07   VAS [place] LOWER EXTREMITY VENOUS (DVT)  Result Date: 01/25/2022  Lower Venous DVT Study Patient Name:  Rhyleigh S Noble   Date of Exam:   01/24/2022 Medical Rec #: [id number]   Accession #:    [id number] Date of Birth: [birth year]  Patient Gender: F Patient Age:   29 years Exam Location:  [facility] Procedure:      VAS [place] LOWER EXTREMITY VENOUS (DVT) Referring Phys: TIMOTHY OPYD --------------------------------------------------------------------------------  Indications: Swelling.  Risk Factors: None identified. Limitations: Poor ultrasound/tissue interface and patient positioning. Comparison Study: No prior studies. Performing Technologist: Oliver Hum RVT  Examination Guidelines: A complete evaluation includes B-mode imaging, spectral Doppler, color Doppler, and power Doppler as needed of all accessible portions of each vessel. Bilateral testing is considered an integral part of a complete examination. Limited examinations for reoccurring indications Kilian be performed as noted. The reflux portion of the exam is performed with the patient in reverse Trendelenburg.  +-----+---------------+---------+-----------+----------+--------------+ RIGHTCompressibilityPhasicitySpontaneityPropertiesThrombus Aging +-----+---------------+---------+-----------+----------+--------------+ CFV  Full           Yes      Yes                                 +-----+---------------+---------+-----------+----------+--------------+    +---------+---------------+---------+-----------+----------+--------------+ LEFT     CompressibilityPhasicitySpontaneityPropertiesThrombus Aging +---------+---------------+---------+-----------+----------+--------------+ CFV      Full           Yes      Yes                                 +---------+---------------+---------+-----------+----------+--------------+ SFJ      Full                                                        +---------+---------------+---------+-----------+----------+--------------+ FV Prox  Full                                                        +---------+---------------+---------+-----------+----------+--------------+  FV Mid   Full                                                        +---------+---------------+---------+-----------+----------+--------------+ FV DistalFull                                                        +---------+---------------+---------+-----------+----------+--------------+ PFV      Full                                                        +---------+---------------+---------+-----------+----------+--------------+ POP      Full           Yes      Yes                                 +---------+---------------+---------+-----------+----------+--------------+ PTV      Full                                                        +---------+---------------+---------+-----------+----------+--------------+ PERO     Full                                                        +---------+---------------+---------+-----------+----------+--------------+    Summary: RIGHT: - No evidence of common femoral vein obstruction.  LEFT: - There is no evidence of deep vein thrombosis in the lower extremity. However, portions of this examination were limited- see technologist comments above.  - No cystic structure found in the popliteal fossa.  *See table(s) above for measurements and observations.  Electronically signed by Jamelle Haring on 01/25/2022 at 9:49:30 AM.    Final    DG Abd Portable 1V-Small Bowel Obstruction Protocol-initial, 8 hr delay  Result Date: 01/24/2022 CLINICAL DATA:  Small-bowel obstruction.  8 hour post image EXAM: PORTABLE ABDOMEN - 1 VIEW COMPARISON:  01/24/2022 at 3:30 a.m. FINDINGS: Contrast is seen within multiple loops of small bowel within the upper abdomen and left pelvis. No definite intra colonic contrast is identified, however. Multiple mildly dilated loops of small bowel are again identified throughout the abdomen in keeping with a distal small bowel obstruction. Nasogastric tube tip is seen within the epigastrium just beyond the expected gastroesophageal junction. Excreted contrast noted within the bladder lumen. Cholecystectomy clips in the right upper quadrant. T12 vertebroplasty has been performed. Right total hip arthroplasty has been performed. IMPRESSION: Persistent distal small bowel obstruction. Contrast is seen within multiple loops of small bowel within the upper abdomen and left pelvis. No definite intra colonic contrast identified. Electronically Signed   By: Linwood Dibbles.D.  On: 01/24/2022 19:39   DG Abdomen 1 View  Result Date: 01/24/2022 CLINICAL DATA:  NG tube placement EXAM: ABDOMEN - 1 VIEW COMPARISON:  CT abdomen and pelvis 01/23/2022 FINDINGS: Single AP radiograph for NG tube placement. The enteric tube tip and side port project over the expected location of the stomach. Mild dilation of gas-filled loops of small bowel compatible with known obstruction as seen on CT 01/23/2022. Surgical clips right upper quadrant. Vertebroplasty T12. IMPRESSION: NG tube in good position. Electronically Signed   By: Placido Sou M.D.   On: 01/24/2022 03:56   CT ABDOMEN PELVIS WO CONTRAST  Result Date: 01/24/2022 CLINICAL DATA:  Abdominal pain, acute, nonlocalized generalized EXAM: CT ABDOMEN AND PELVIS WITHOUT CONTRAST TECHNIQUE: Multidetector CT imaging  of the abdomen and pelvis was performed following the standard protocol without IV contrast. RADIATION DOSE REDUCTION: This exam was performed according to the departmental dose-optimization program which includes automated exposure control, adjustment of the mA and/or kV according to patient size and/or use of iterative reconstruction technique. COMPARISON:  03/13/2020 FINDINGS: Lower chest: No acute abnormality. Hepatobiliary: Status post cholecystectomy. There is marked extrahepatic and mild intrahepatic biliary ductal dilation which appears progressive since prior examination with the extrahepatic bile duct measuring up to 21 mm in diameter. While this Mcvicar simply represent post cholecystectomy change, a distal obstructing process could appear similarly. No definite intrahepatic mass identified on this noncontrast examination. Pancreas: Scattered parenchymal calcifications are seen within the pancreas, particular within the pancreatic head which Broady reflect changes of chronic pancreatitis. These appears stable since prior examination. No superimposed peripancreatic inflammatory stranding. Cystic lesion within the a tail the pancreas demonstrates slight interval increase in size since prior examination measuring 20 x 13 mm at axial image # 26/3, not optimally characterized on this examination. Spleen: Unremarkable Adrenals/Urinary Tract: Adrenal glands are unremarkable. Kidneys are normal, without renal calculi, focal lesion, or hydronephrosis. Bladder is partially obscured by streak artifact from right total hip arthroplasty, however, the visualized segment appears unremarkable. Stomach/Bowel: Surgical changes of probable ileocecectomy are again identified. There is persistent diffusely dilated and fluid-filled loops of small bowel throughout the abdomen extending to the anastomosis within the right lower quadrant of the abdomen with inspissated stool within the terminal small bowel. The colon distal to the  anastomosis appears decompressed and, together, the findings suggest a partial or developing small bowel obstruction at the surgical anastomosis. Mild ascites is again seen, unchanged. No free intraperitoneal gas. Moderate diverticulosis of the mid jejunum within the left mid abdomen is again seen without superimposed acute inflammatory change in this region. Vascular/Lymphatic: Aortic atherosclerosis. No enlarged abdominal or pelvic lymph nodes. Reproductive: The pelvis is partially obscured, however, surgical changes of hysterectomy are identified. No definite axial mass. Other: No abdominal wall hernia.  Rectum unremarkable. Musculoskeletal: Osseous structures are diffusely osteopenic. T12 compression deformity status post vertebroplasty is again identified, unchanged. Degenerative changes are seen within the a lower lumbar spine. No acute bone abnormality. Right total hip arthroplasty has been performed. IMPRESSION: 1. Surgical changes of probable ileocecectomy. Persistent diffusely dilated and fluid-filled loops of small bowel throughout the abdomen extending to the anastomosis within the right lower quadrant of the abdomen with inspissated stool within the terminal small bowel. The colon distal to the anastomosis appears decompressed and, together, the findings suggest a partial or developing small bowel obstruction at the surgical anastomosis. 2. Status post cholecystectomy. Interval development of marked extrahepatic and mild intrahepatic biliary ductal dilation. While this Casaus simply represent post cholecystectomy  change, a distal obstructing process could appear similarly. Correlation with liver enzymes is recommended to exclude underlying obstructive process. 3. Slight interval increase in size of a cystic lesion within the tail the pancreas measuring 20 x 13 mm, not optimally characterized on this examination. This Sanda represent a pseudocyst or cystic neoplasm such as a side branch intraductal papillary  mucinous neoplasm. This could be further assessed with MRI examination, if indicated. 4. Moderate diverticulosis of the mid jejunum without superimposed acute inflammatory change. 5. Osteopenia. Stable T12 compression deformity status post vertebroplasty. Aortic Atherosclerosis (ICD10-I70.0). Electronically Signed   By: Fidela Salisbury M.D.   On: 01/24/2022 00:16   DG Abdomen Acute W/Chest  Result Date: 01/23/2022 CLINICAL DATA:  Abdominal pain and weakness EXAM: DG ABDOMEN ACUTE WITH 1 VIEW CHEST COMPARISON:  CT abdomen and pelvis from earlier today and chest radiographs 12/21/2021 FINDINGS: Dilated loops of small bowel in the central abdomen. No radiopaque calculi or other significant radiographic abnormality is seen. Cholecystectomy clips. Low lung volumes. Bibasilar atelectasis/scarring. Chronic bilateral bronchitic changes. Stable heart size. No pleural effusion or pneumothorax. No acute osseous abnormality. Right THA. IMPRESSION: Gas-filled dilation of loops of small bowel in the central abdomen concerning for obstruction. Recommend correlation with same day CT abdomen and pelvis. Electronically Signed   By: Placido Sou M.D.   On: 01/23/2022 23:57     TODAY-DAY OF DISCHARGE:  Subjective:   Ruweyda Heimann today has no headache,no chest abdominal pain,no new weakness tingling or numbness, feels much better wants to go home today.   Objective:   Blood pressure (!) 150/79, pulse 76, temperature 99.2 F (37.3 C), temperature source Oral, resp. rate 16, height 5' 7.5" (1.715 m), weight 65.5 kg, SpO2 97 %.  Intake/Output Summary (Last 24 hours) at 01/29/2022 0956 Last data filed at 01/28/2022 1700 Gross per 24 hour  Intake 320 ml  Output 1600 ml  Net -1280 ml   Filed Weights   01/26/22 0450 01/27/22 0348 01/29/22 0428  Weight: 65.4 kg 65.4 kg 65.5 kg    Exam: Awake Alert, Oriented *3, No new F.N deficits, Normal affect [place].AT,PERRAL Supple Neck,No JVD, No cervical lymphadenopathy  appriciated.  Symmetrical Chest wall movement, Good air movement bilaterally, CTAB RRR,No Gallops,Rubs or new Murmurs, No Parasternal Heave +ve B.Sounds, Abd Soft, Non tender, No organomegaly appriciated, No rebound -guarding or rigidity. No Cyanosis, Clubbing or edema, No new Rash or bruise   PERTINENT RADIOLOGIC STUDIES: No results found.   PERTINENT LAB RESULTS: CBC: Recent Labs    01/28/22 0743  WBC 6.0  HGB 10.6*  HCT 30.7*  PLT 300   CMET CMP     Component Value Date/Time   NA 132 (L) 01/29/2022 0628   NA 130 (L) 12/24/2020 1143   NA 138 01/04/2013 1212   K 3.0 (L) 01/29/2022 0628   K 3.9 01/04/2013 1212   CL 98 01/29/2022 0628   CO2 26 01/29/2022 0628   CO2 22 01/04/2013 1212   GLUCOSE 249 (H) 01/29/2022 0628   GLUCOSE 285 (H) 01/04/2013 1212   BUN <5 (L) 01/29/2022 0628   BUN 14 12/24/2020 1143   BUN 10.3 01/04/2013 1212   CREATININE 0.56 01/29/2022 0628   CREATININE 0.78 12/01/2013 1659   CREATININE 1.0 01/04/2013 1212   CALCIUM 7.9 (L) 01/29/2022 0628   CALCIUM 9.6 01/04/2013 1212   PROT 6.4 (L) 01/23/2022 2246   PROT 6.3 12/24/2020 1143   PROT 6.4 01/04/2013 1212   ALBUMIN 3.4 (L) 01/23/2022 2246  ALBUMIN 4.2 12/24/2020 1143   ALBUMIN 3.6 01/04/2013 1212   AST 26 01/23/2022 2246   AST 14 01/04/2013 1212   ALT 29 01/23/2022 2246   ALT 16 01/04/2013 1212   ALKPHOS 95 01/23/2022 2246   ALKPHOS 52 01/04/2013 1212   BILITOT 0.4 01/23/2022 2246   BILITOT <0.2 12/24/2020 1143   BILITOT 1.01 01/04/2013 1212   GFRNONAA >60 01/29/2022 0628   GFRAA >60 01/02/2020 0455    GFR Estimated Creatinine Clearance: 47.3 mL/min (by C-G formula based on SCr of 0.56 mg/dL). No results for input(s): "LIPASE", "AMYLASE" in the last 72 hours. No results for input(s): "CKTOTAL", "CKMB", "CKMBINDEX", "TROPONINI" in the last 72 hours. Invalid input(s): "POCBNP" No results for input(s): "DDIMER" in the last 72 hours. No results for input(s): "HGBA1C" in the last 72  hours. No results for input(s): "CHOL", "HDL", "LDLCALC", "TRIG", "CHOLHDL", "LDLDIRECT" in the last 72 hours. No results for input(s): "TSH", "T4TOTAL", "T3FREE", "THYROIDAB" in the last 72 hours.  Invalid input(s): "FREET3" No results for input(s): "VITAMINB12", "FOLATE", "FERRITIN", "TIBC", "IRON", "RETICCTPCT" in the last 72 hours. Coags: No results for input(s): "INR" in the last 72 hours.  Invalid input(s): "PT" Microbiology: No results found for this or any previous visit (from the past 240 hour(s)).  FURTHER DISCHARGE INSTRUCTIONS:  Get Medicines reviewed and adjusted: Please take all your medications with you for your next visit with your Primary MD  Laboratory/radiological data: Please request your Primary MD to go over all hospital tests and procedure/radiological results at the follow up, please ask your Primary MD to get all Hospital records sent to his/her office.  In some cases, they will be blood work, cultures and biopsy results pending at the time of your discharge. Please request that your primary care M.D. goes through all the records of your hospital data and follows up on these results.  Also Note the following: If you experience worsening of your admission symptoms, develop shortness of breath, life threatening emergency, suicidal or homicidal thoughts you must seek medical attention immediately by calling 911 or calling your MD immediately  if symptoms less severe.  You must read complete instructions/literature along with all the possible adverse reactions/side effects for all the Medicines you take and that have been prescribed to you. Take any new Medicines after you have completely understood and accpet all the possible adverse reactions/side effects.   Do not drive when taking Pain medications or sleeping medications (Benzodaizepines)  Do not take more than prescribed Pain, Sleep and Anxiety Medications. It is not advisable to combine anxiety,sleep and pain  medications without talking with your primary care practitioner  Special Instructions: If you have smoked or chewed Tobacco  in the last 2 yrs please stop smoking, stop any regular Alcohol  and or any Recreational drug use.  Wear Seat belts while driving.  Please note: You were cared for by a hospitalist during your hospital stay. Once you are discharged, your primary care physician will handle any further medical issues. Please note that NO REFILLS for any discharge medications will be authorized once you are discharged, as it is imperative that you return to your primary care physician (or establish a relationship with a primary care physician if you do not have one) for your post hospital discharge needs so that they can reassess your need for medications and monitor your lab values.  Total Time spent coordinating discharge including counseling, education and face to face time equals greater than 30 minutes.  Signed: Dispensing optician  Sausha Raymond 01/29/2022 9:56 AM

## 2022-01-29 NOTE — Progress Notes (Signed)
Pt discharging to SNF, [facility].  Copy of instructions printed and sent with transporters for facility.  Report called to [employer], spoke with nurse [facility] giving her report.  Pt d/c'd with belongings, transported by [facility].

## 2022-01-30 DIAGNOSIS — K56609 Unspecified intestinal obstruction, unspecified as to partial versus complete obstruction: Secondary | ICD-10-CM | POA: Diagnosis not present

## 2022-01-30 DIAGNOSIS — E1140 Type 2 diabetes mellitus with diabetic neuropathy, unspecified: Secondary | ICD-10-CM | POA: Diagnosis not present

## 2022-01-30 DIAGNOSIS — K219 Gastro-esophageal reflux disease without esophagitis: Secondary | ICD-10-CM | POA: Diagnosis not present

## 2022-01-30 DIAGNOSIS — I10 Essential (primary) hypertension: Secondary | ICD-10-CM | POA: Diagnosis not present

## 2022-01-30 DIAGNOSIS — E039 Hypothyroidism, unspecified: Secondary | ICD-10-CM | POA: Diagnosis not present

## 2022-02-02 DIAGNOSIS — I2510 Atherosclerotic heart disease of native coronary artery without angina pectoris: Secondary | ICD-10-CM | POA: Diagnosis not present

## 2022-02-02 DIAGNOSIS — E871 Hypo-osmolality and hyponatremia: Secondary | ICD-10-CM | POA: Diagnosis not present

## 2022-02-02 DIAGNOSIS — E785 Hyperlipidemia, unspecified: Secondary | ICD-10-CM | POA: Diagnosis not present

## 2022-02-02 DIAGNOSIS — F411 Generalized anxiety disorder: Secondary | ICD-10-CM | POA: Diagnosis not present

## 2022-02-02 DIAGNOSIS — E039 Hypothyroidism, unspecified: Secondary | ICD-10-CM | POA: Diagnosis not present

## 2022-02-02 DIAGNOSIS — I10 Essential (primary) hypertension: Secondary | ICD-10-CM | POA: Diagnosis not present

## 2022-02-02 DIAGNOSIS — E1165 Type 2 diabetes mellitus with hyperglycemia: Secondary | ICD-10-CM | POA: Diagnosis not present

## 2022-02-02 DIAGNOSIS — D649 Anemia, unspecified: Secondary | ICD-10-CM | POA: Diagnosis not present

## 2022-02-02 DIAGNOSIS — K56699 Other intestinal obstruction unspecified as to partial versus complete obstruction: Secondary | ICD-10-CM | POA: Diagnosis not present

## 2022-02-02 DIAGNOSIS — K56609 Unspecified intestinal obstruction, unspecified as to partial versus complete obstruction: Secondary | ICD-10-CM | POA: Diagnosis not present

## 2022-02-02 DIAGNOSIS — E118 Type 2 diabetes mellitus with unspecified complications: Secondary | ICD-10-CM | POA: Diagnosis not present

## 2022-02-02 DIAGNOSIS — E876 Hypokalemia: Secondary | ICD-10-CM | POA: Diagnosis not present

## 2022-02-03 DIAGNOSIS — K56699 Other intestinal obstruction unspecified as to partial versus complete obstruction: Secondary | ICD-10-CM | POA: Diagnosis not present

## 2022-02-03 DIAGNOSIS — K5900 Constipation, unspecified: Secondary | ICD-10-CM | POA: Diagnosis not present

## 2022-02-03 DIAGNOSIS — E782 Mixed hyperlipidemia: Secondary | ICD-10-CM | POA: Diagnosis not present

## 2022-02-03 DIAGNOSIS — K219 Gastro-esophageal reflux disease without esophagitis: Secondary | ICD-10-CM | POA: Diagnosis not present

## 2022-02-05 DIAGNOSIS — I2510 Atherosclerotic heart disease of native coronary artery without angina pectoris: Secondary | ICD-10-CM | POA: Diagnosis not present

## 2022-02-05 DIAGNOSIS — D649 Anemia, unspecified: Secondary | ICD-10-CM | POA: Diagnosis not present

## 2022-02-05 DIAGNOSIS — K56699 Other intestinal obstruction unspecified as to partial versus complete obstruction: Secondary | ICD-10-CM | POA: Diagnosis not present

## 2022-02-05 DIAGNOSIS — E039 Hypothyroidism, unspecified: Secondary | ICD-10-CM | POA: Diagnosis not present

## 2022-02-05 DIAGNOSIS — F411 Generalized anxiety disorder: Secondary | ICD-10-CM | POA: Diagnosis not present

## 2022-02-05 DIAGNOSIS — I10 Essential (primary) hypertension: Secondary | ICD-10-CM | POA: Diagnosis not present

## 2022-02-05 DIAGNOSIS — E118 Type 2 diabetes mellitus with unspecified complications: Secondary | ICD-10-CM | POA: Diagnosis not present

## 2022-02-05 DIAGNOSIS — E785 Hyperlipidemia, unspecified: Secondary | ICD-10-CM | POA: Diagnosis not present

## 2022-02-06 DIAGNOSIS — R197 Diarrhea, unspecified: Secondary | ICD-10-CM | POA: Diagnosis not present

## 2022-02-06 DIAGNOSIS — E1165 Type 2 diabetes mellitus with hyperglycemia: Secondary | ICD-10-CM | POA: Diagnosis not present

## 2022-02-09 ENCOUNTER — Ambulatory Visit: Payer: Medicare Other | Admitting: Neurology

## 2022-02-09 DIAGNOSIS — I2510 Atherosclerotic heart disease of native coronary artery without angina pectoris: Secondary | ICD-10-CM | POA: Diagnosis not present

## 2022-02-09 DIAGNOSIS — I10 Essential (primary) hypertension: Secondary | ICD-10-CM | POA: Diagnosis not present

## 2022-02-09 DIAGNOSIS — E1165 Type 2 diabetes mellitus with hyperglycemia: Secondary | ICD-10-CM | POA: Diagnosis not present

## 2022-02-09 DIAGNOSIS — E785 Hyperlipidemia, unspecified: Secondary | ICD-10-CM | POA: Diagnosis not present

## 2022-02-09 DIAGNOSIS — F411 Generalized anxiety disorder: Secondary | ICD-10-CM | POA: Diagnosis not present

## 2022-02-09 DIAGNOSIS — E118 Type 2 diabetes mellitus with unspecified complications: Secondary | ICD-10-CM | POA: Diagnosis not present

## 2022-02-09 DIAGNOSIS — K56609 Unspecified intestinal obstruction, unspecified as to partial versus complete obstruction: Secondary | ICD-10-CM | POA: Diagnosis not present

## 2022-02-09 DIAGNOSIS — K56699 Other intestinal obstruction unspecified as to partial versus complete obstruction: Secondary | ICD-10-CM | POA: Diagnosis not present

## 2022-02-09 DIAGNOSIS — D649 Anemia, unspecified: Secondary | ICD-10-CM | POA: Diagnosis not present

## 2022-02-09 DIAGNOSIS — K219 Gastro-esophageal reflux disease without esophagitis: Secondary | ICD-10-CM | POA: Diagnosis not present

## 2022-02-09 DIAGNOSIS — E039 Hypothyroidism, unspecified: Secondary | ICD-10-CM | POA: Diagnosis not present

## 2022-02-09 DIAGNOSIS — E782 Mixed hyperlipidemia: Secondary | ICD-10-CM | POA: Diagnosis not present

## 2022-02-10 IMAGING — CT CT ABD-PELV W/ CM
2 of 5 series · 15 of 46 positions shown, 17 images · IV contrast (APPLIED)
Comparison: None.

CLINICAL DATA: Patient reports having colon cancer diagnosed 2-3
weeks ago.

EXAM:
CT ABDOMEN AND PELVIS WITH CONTRAST
TECHNIQUE: Multidetector CT imaging of the abdomen and pelvis was performed
using the standard protocol following bolus administration of
intravenous contrast.
CONTRAST:  100mL OMNIPAQUE IOHEXOL 300 MG/ML  SOLN

[Series 2: axial st · axial · 0.83mm/px · z∈[-517,-122]mm · 12 of 93 slices shown, 14 images]
[im 7/93  soft-tissue]
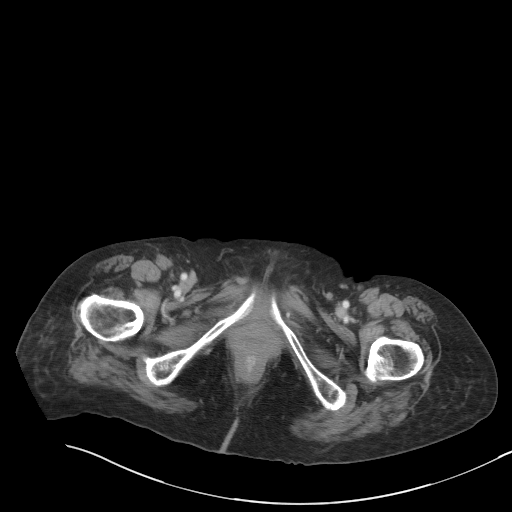
[im 7/93  bone]
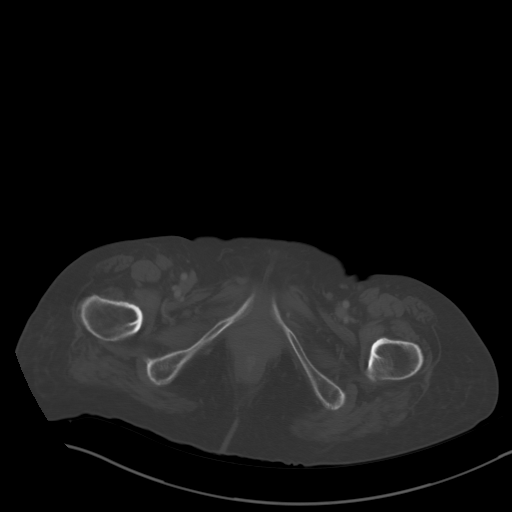
[im 13/93  soft-tissue]
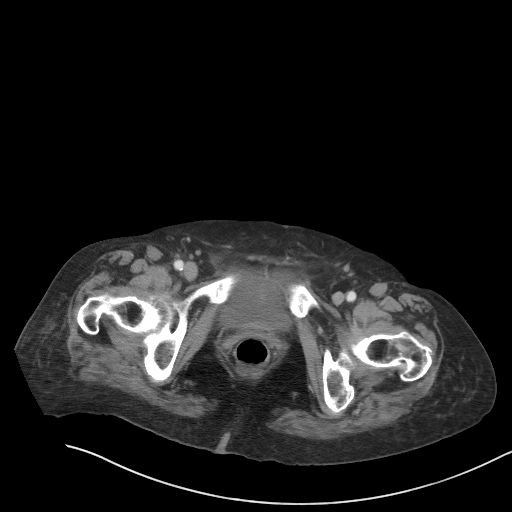
[im 19/93  soft-tissue]
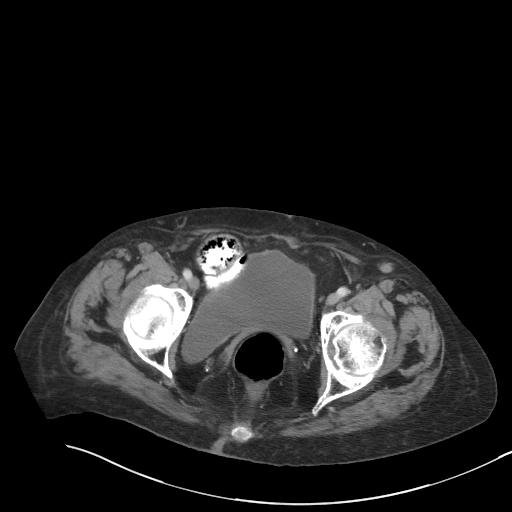
[im 31/93  soft-tissue]
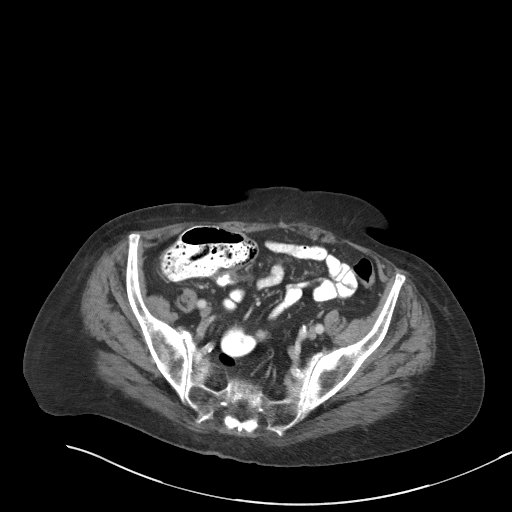
[im 37/93  soft-tissue]
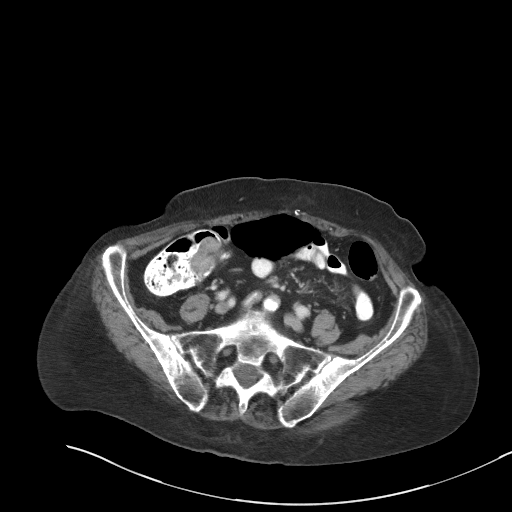
[im 43/93  soft-tissue]
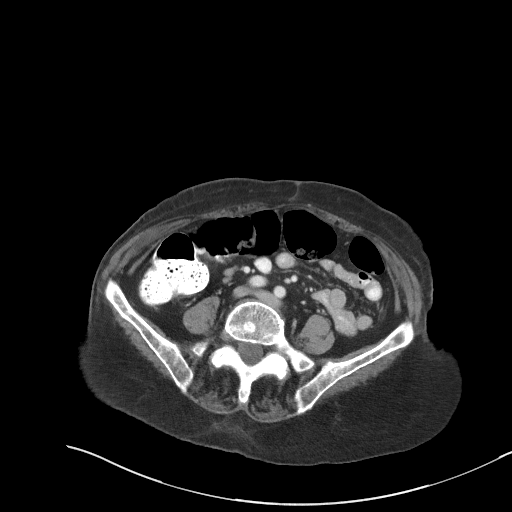
[im 50/93  soft-tissue]
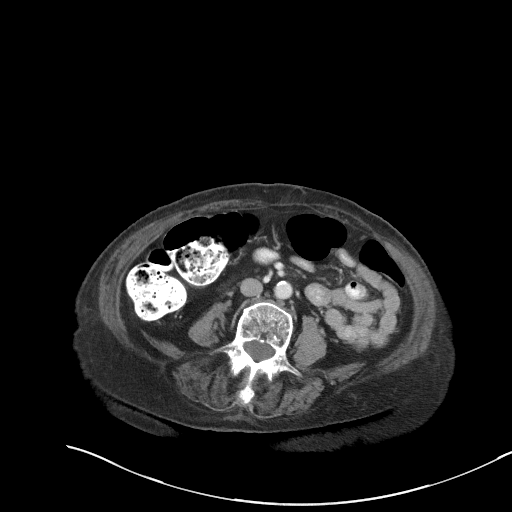
[im 56/93  soft-tissue]
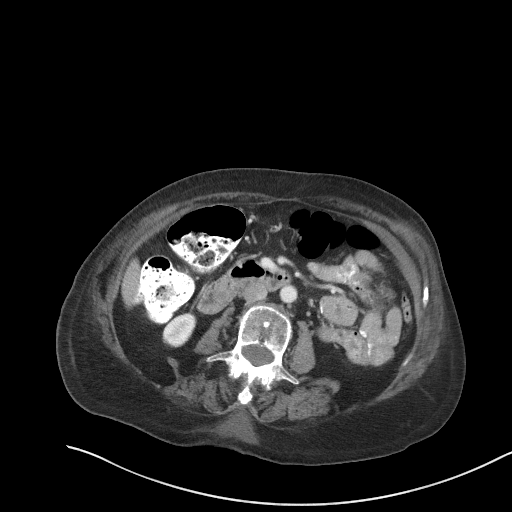
[im 62/93  soft-tissue]
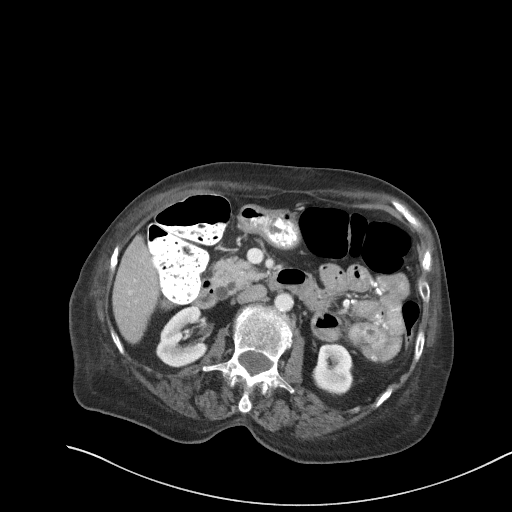
[im 62/93  bone]
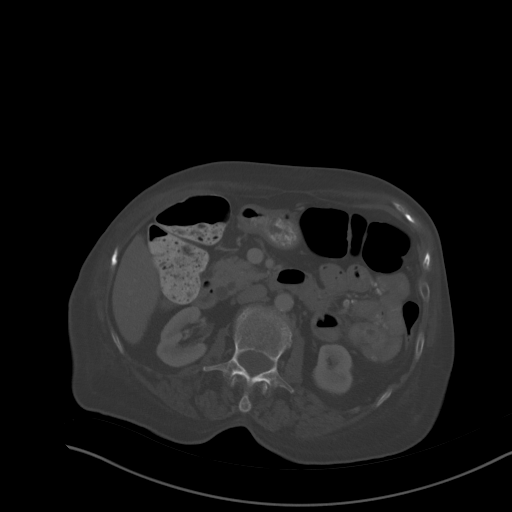
[im 74/93  soft-tissue]
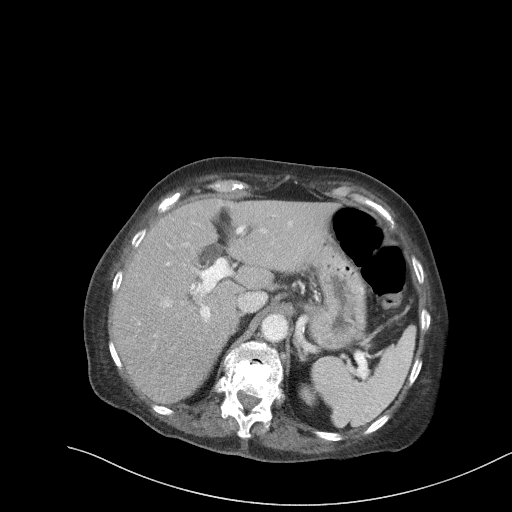
[im 80/93  soft-tissue]
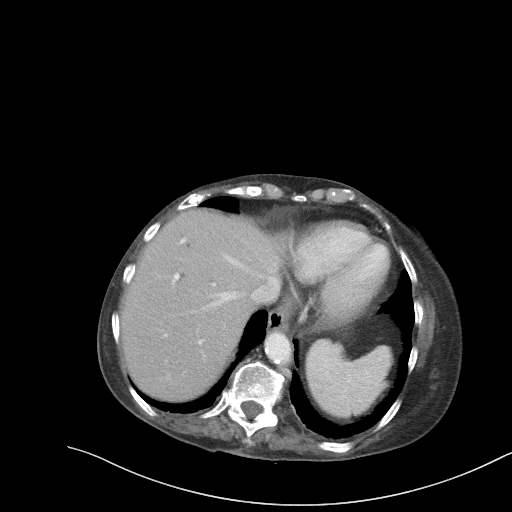
[im 86/93  soft-tissue]
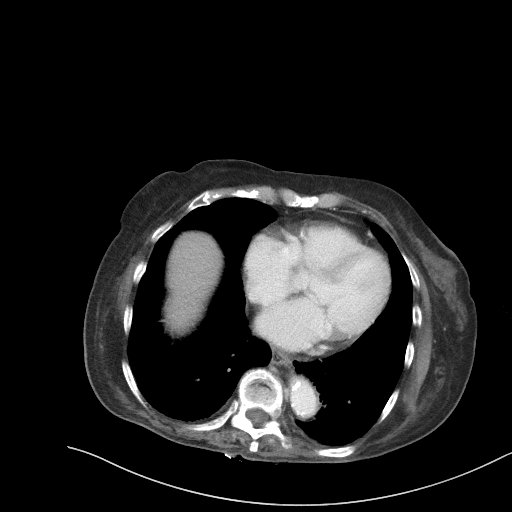

[Series 4: coronal st · coronal · 0.76mm/px · 3 of 86 slices shown]
[im 29/86  soft-tissue]
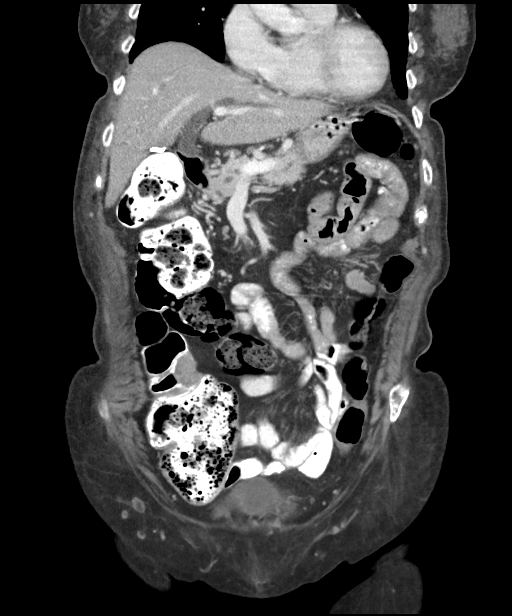
[im 38/86  soft-tissue]
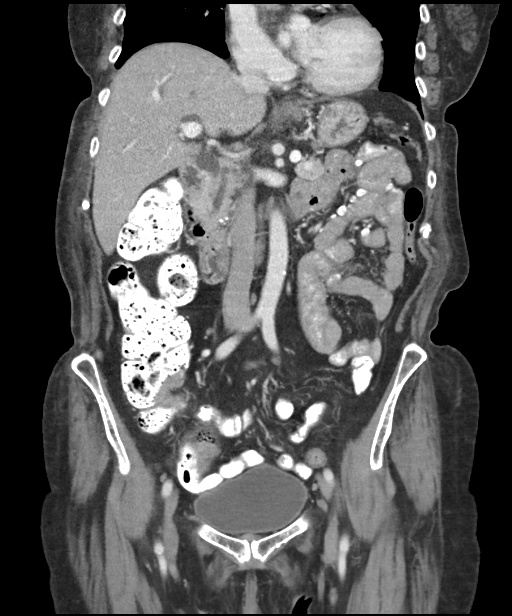
[im 48/86  soft-tissue]
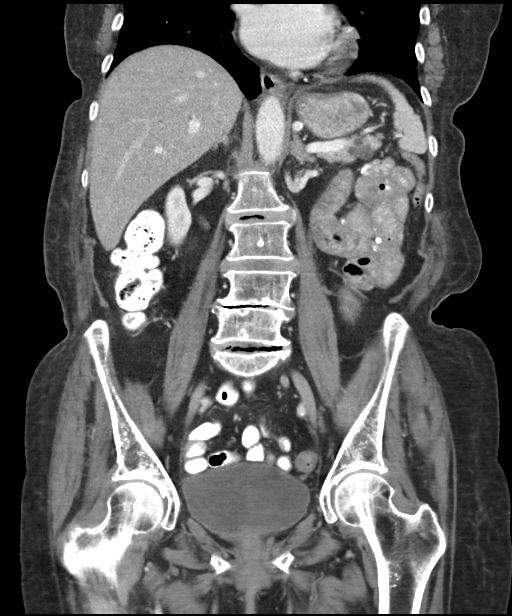

[15 of 46 positions shown; findings below may reference images not displayed]

FINDINGS: Lower chest: Lung bases are unremarkable. There is minimal
atherosclerotic calcification of the coronary arteries. Heart size
is mildly enlarged. No pericardial effusion.

Hepatobiliary: Cholecystectomy. Common bile duct is ectatic,
measuring 11 millimeters post cholecystectomy. There is mild intra
hepatic duct dilatation.

Pancreas: Small cysts are identified within the pancreatic body and
tail. Cyst in the pancreatic tail region is 1.3 x 2.0 centimeters on
image 24 of series 2. Adjacent cyst is 0.9 centimeters on image 22
of series 2. Cyst in the junction of the body and tail is 1.6 x
centimeters on image 27 of series 2. There is mild pancreatic duct
ectasia. Focal calcifications within the region of the pancreatic
head are consistent with chronic pancreatitis.

Spleen: Normal in size without focal abnormality.

Adrenals/Urinary Tract: Adrenal glands are normal in appearance. No
hydronephrosis. Kidneys are symmetric in enhancement and excretion.
No hydronephrosis. Ureters are unremarkable.

Stomach/Bowel: Stomach is normal in appearance. Small duodenal
diverticulum. Small bowel loops are normal in appearance. Appendix
is not seen.

Within the cecum there is a soft tissue mass measuring 3.8 x 2.4 x
2.9 centimeters, adherent to the MEDIAL wall of the cecum and is
discrete from the ileocecal valve. There are 3 adjacent small RIGHT
LOWER QUADRANT mesenteric lymph nodes, largest measuring 7
millimeters on coronal image 41 of series 4.

Vascular/Lymphatic: There is atherosclerotic calcification of the
abdominal aorta. No associated aneurysm. Although involved by
atherosclerosis, there is vascular opacification of the celiac axis,
superior mesenteric artery, and inferior mesenteric artery. Normal
appearance of the portal venous system and inferior vena cava. No
retroperitoneal adenopathy. Small RIGHT LOWER QUADRANT mesenteric
lymph nodes, measuring up to 7 millimeters.

Reproductive: Hysterectomy.  No adnexal mass.

Other: No ascites.  Anterior abdominal wall is unremarkable.

Musculoskeletal: Moderate degenerative changes in the lumbar spine.
Chronic compression fracture and vertebral augmentation at T12. 3
millimeters anterolisthesis of L2 on L3. Vacuum disc phenomenon
identified at L2-3, L4-5, and L5-S1. There are scattered bone
islands. No evidence for lytic or blastic metastatic disease.
IMPRESSION: 1. Soft tissue mass in the cecum measuring 3.8 x 2.4 x
centimeters, discrete from the ileocecal valve, likely representing
the patient's known colon cancer. There is no associated
obstruction.
2. Small RIGHT LOWER QUADRANT mesenteric lymph nodes, largest
measuring 7 millimeters.
3. No evidence for distant metastases.
4. Status post cholecystectomy, hysterectomy, and appendectomy.
5. Chronic pancreatitis. Small cysts in the pancreatic body and
tail, measuring up to 2.0 centimeters. Recommend follow up pre and
post contrast MRI/MRCP or pancreatic protocol CT in 2 years. This
recommendation follows ACR consensus guidelines: Management of
Incidental Pancreatic Cysts: [place] Paper of the ACR Incidental
Findings Committee. [HOSPITAL] [id number];[DATE].
6. Chronic compression fracture and vertebral augmentation at T12.
7. Lumbar spondylosis.
8. Coronary artery disease.
9. Aortic Atherosclerosis ([id number]).

## 2022-02-11 DIAGNOSIS — Z85038 Personal history of other malignant neoplasm of large intestine: Secondary | ICD-10-CM | POA: Diagnosis not present

## 2022-02-11 DIAGNOSIS — F419 Anxiety disorder, unspecified: Secondary | ICD-10-CM | POA: Diagnosis not present

## 2022-02-11 DIAGNOSIS — Z79899 Other long term (current) drug therapy: Secondary | ICD-10-CM | POA: Diagnosis not present

## 2022-02-11 DIAGNOSIS — Z794 Long term (current) use of insulin: Secondary | ICD-10-CM | POA: Diagnosis not present

## 2022-02-11 DIAGNOSIS — K589 Irritable bowel syndrome without diarrhea: Secondary | ICD-10-CM | POA: Diagnosis not present

## 2022-02-11 DIAGNOSIS — E039 Hypothyroidism, unspecified: Secondary | ICD-10-CM | POA: Diagnosis not present

## 2022-02-11 DIAGNOSIS — G250 Essential tremor: Secondary | ICD-10-CM | POA: Diagnosis not present

## 2022-02-11 DIAGNOSIS — C253 Malignant neoplasm of pancreatic duct: Secondary | ICD-10-CM | POA: Diagnosis not present

## 2022-02-11 DIAGNOSIS — E1140 Type 2 diabetes mellitus with diabetic neuropathy, unspecified: Secondary | ICD-10-CM | POA: Diagnosis not present

## 2022-02-11 DIAGNOSIS — I2510 Atherosclerotic heart disease of native coronary artery without angina pectoris: Secondary | ICD-10-CM | POA: Diagnosis not present

## 2022-02-11 DIAGNOSIS — E782 Mixed hyperlipidemia: Secondary | ICD-10-CM | POA: Diagnosis not present

## 2022-02-11 DIAGNOSIS — E876 Hypokalemia: Secondary | ICD-10-CM | POA: Diagnosis not present

## 2022-02-11 DIAGNOSIS — M810 Age-related osteoporosis without current pathological fracture: Secondary | ICD-10-CM | POA: Diagnosis not present

## 2022-02-11 DIAGNOSIS — Z853 Personal history of malignant neoplasm of breast: Secondary | ICD-10-CM | POA: Diagnosis not present

## 2022-02-11 DIAGNOSIS — E538 Deficiency of other specified B group vitamins: Secondary | ICD-10-CM | POA: Diagnosis not present

## 2022-02-11 DIAGNOSIS — N189 Chronic kidney disease, unspecified: Secondary | ICD-10-CM | POA: Diagnosis not present

## 2022-02-11 DIAGNOSIS — E1122 Type 2 diabetes mellitus with diabetic chronic kidney disease: Secondary | ICD-10-CM | POA: Diagnosis not present

## 2022-02-11 DIAGNOSIS — Z96641 Presence of right artificial hip joint: Secondary | ICD-10-CM | POA: Diagnosis not present

## 2022-02-11 DIAGNOSIS — G40901 Epilepsy, unspecified, not intractable, with status epilepticus: Secondary | ICD-10-CM | POA: Diagnosis not present

## 2022-02-11 DIAGNOSIS — I779 Disorder of arteries and arterioles, unspecified: Secondary | ICD-10-CM | POA: Diagnosis not present

## 2022-02-11 DIAGNOSIS — I1310 Hypertensive heart and chronic kidney disease without heart failure, with stage 1 through stage 4 chronic kidney disease, or unspecified chronic kidney disease: Secondary | ICD-10-CM | POA: Diagnosis not present

## 2022-02-11 DIAGNOSIS — K219 Gastro-esophageal reflux disease without esophagitis: Secondary | ICD-10-CM | POA: Diagnosis not present

## 2022-02-11 DIAGNOSIS — Z9181 History of falling: Secondary | ICD-10-CM | POA: Diagnosis not present

## 2022-02-11 DIAGNOSIS — D631 Anemia in chronic kidney disease: Secondary | ICD-10-CM | POA: Diagnosis not present

## 2022-02-11 DIAGNOSIS — D630 Anemia in neoplastic disease: Secondary | ICD-10-CM | POA: Diagnosis not present

## 2022-02-12 DIAGNOSIS — C253 Malignant neoplasm of pancreatic duct: Secondary | ICD-10-CM | POA: Diagnosis not present

## 2022-02-12 DIAGNOSIS — E1122 Type 2 diabetes mellitus with diabetic chronic kidney disease: Secondary | ICD-10-CM | POA: Diagnosis not present

## 2022-02-12 DIAGNOSIS — D630 Anemia in neoplastic disease: Secondary | ICD-10-CM | POA: Diagnosis not present

## 2022-02-12 DIAGNOSIS — N189 Chronic kidney disease, unspecified: Secondary | ICD-10-CM | POA: Diagnosis not present

## 2022-02-12 DIAGNOSIS — D631 Anemia in chronic kidney disease: Secondary | ICD-10-CM | POA: Diagnosis not present

## 2022-02-12 DIAGNOSIS — I1310 Hypertensive heart and chronic kidney disease without heart failure, with stage 1 through stage 4 chronic kidney disease, or unspecified chronic kidney disease: Secondary | ICD-10-CM | POA: Diagnosis not present

## 2022-02-16 ENCOUNTER — Ambulatory Visit (INDEPENDENT_AMBULATORY_CARE_PROVIDER_SITE_OTHER): Payer: Medicare Other | Admitting: Neurology

## 2022-02-16 ENCOUNTER — Encounter: Payer: Self-pay | Admitting: Neurology

## 2022-02-16 VITALS — BP 145/79 | HR 76 | Ht 67.5 in | Wt 140.0 lb

## 2022-02-16 DIAGNOSIS — G250 Essential tremor: Secondary | ICD-10-CM

## 2022-02-16 DIAGNOSIS — G40909 Epilepsy, unspecified, not intractable, without status epilepticus: Secondary | ICD-10-CM

## 2022-02-16 DIAGNOSIS — R269 Unspecified abnormalities of gait and mobility: Secondary | ICD-10-CM | POA: Diagnosis not present

## 2022-02-16 IMAGING — CT CT CHEST W/ CM
2 of 3 series · 15 of 36 positions shown, 18 images · IV contrast (OMNIPAQUE)
Comparison: CT abdomen/pelvis 11/22/2019

CLINICAL DATA: New diagnosis of colon cancer. Evaluate for
metastatic disease. Prior history of right breast cancer.

EXAM:
CT CHEST WITH CONTRAST
TECHNIQUE: Multidetector CT imaging of the chest was performed during
intravenous contrast administration.
CONTRAST:  75mL OMNIPAQUE IOHEXOL 300 MG/ML  SOLN

[Series 2: axial st · axial · 0.64mm/px · z∈[+1347,+1579]mm · 12 of 138 slices shown, 15 images]
[im 11/138  mediastinal]
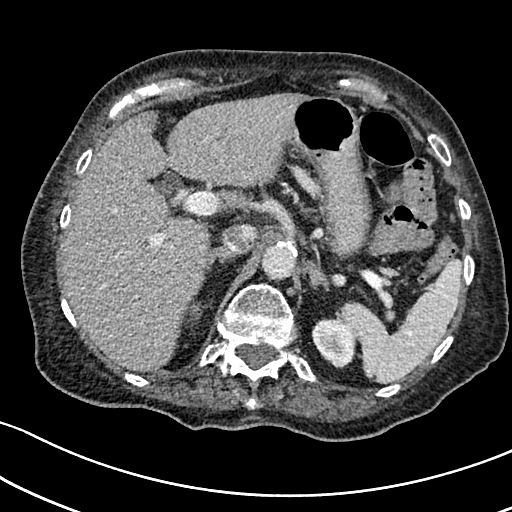
[im 11/138  lung]
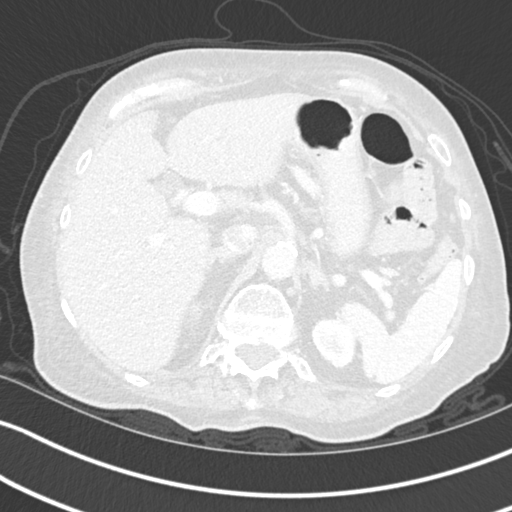
[im 21/138  lung]
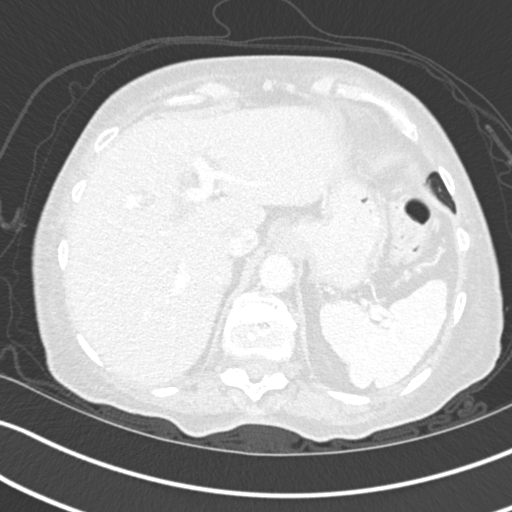
[im 31/138  lung]
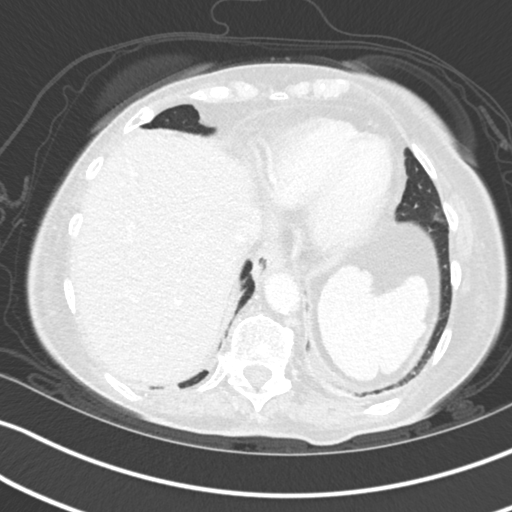
[im 41/138  lung]
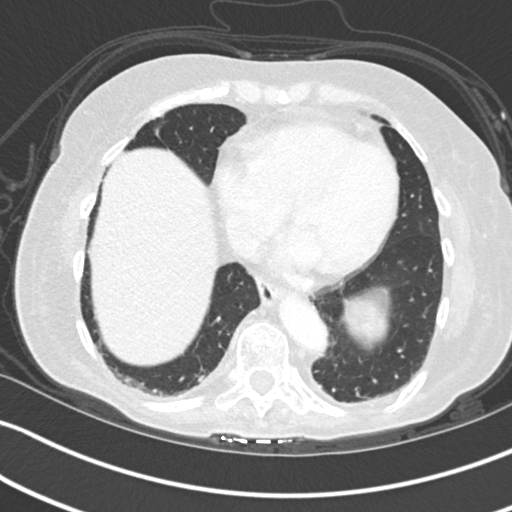
[im 51/138  mediastinal]
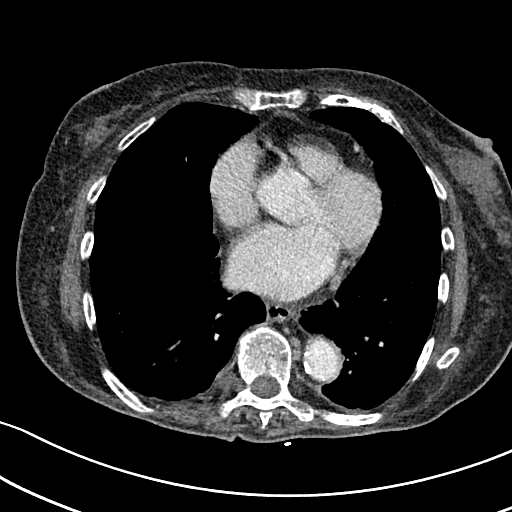
[im 51/138  lung]
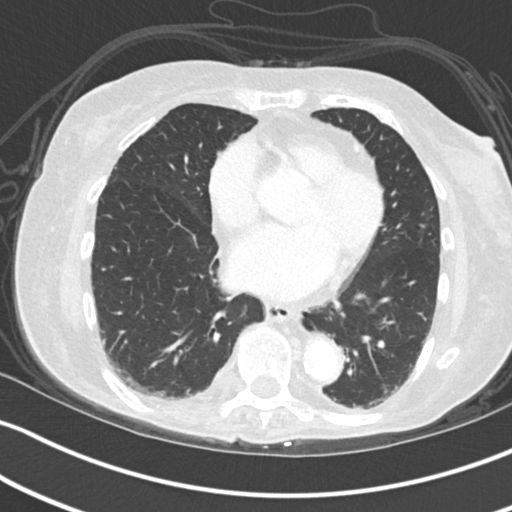
[im 61/138  lung]
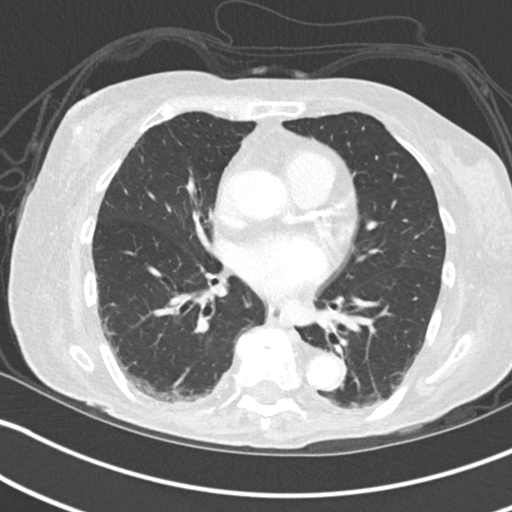
[im 77/138  lung]
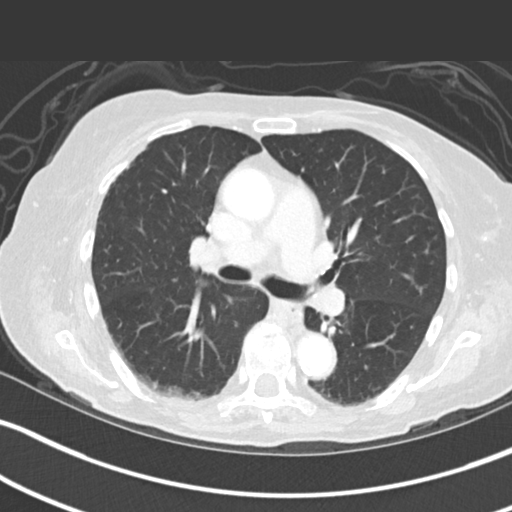
[im 87/138  lung]
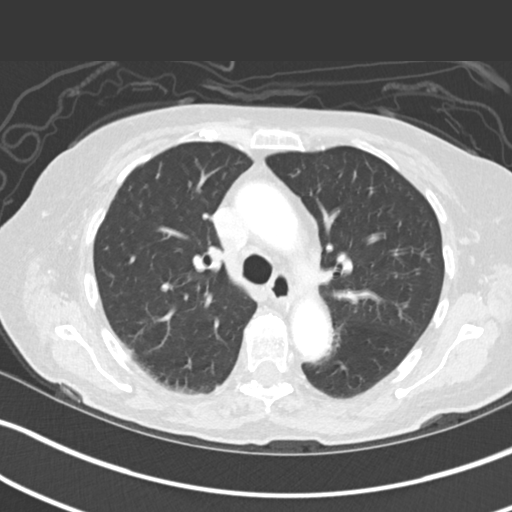
[im 97/138  mediastinal]
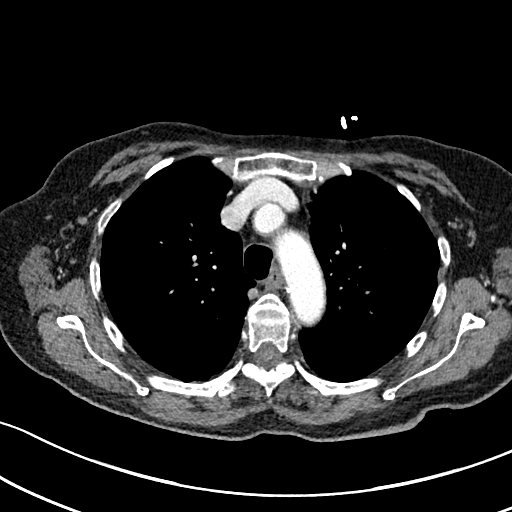
[im 97/138  lung]
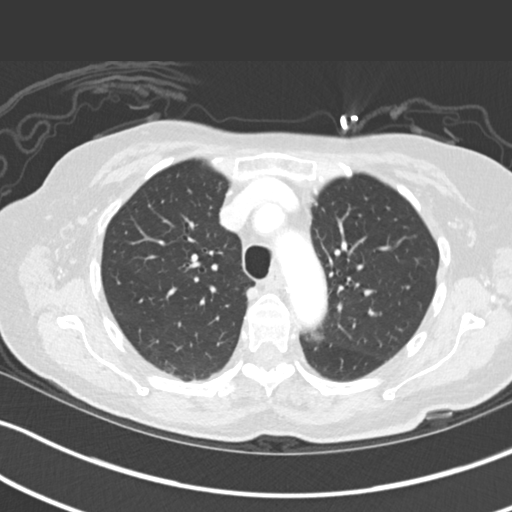
[im 107/138  lung]
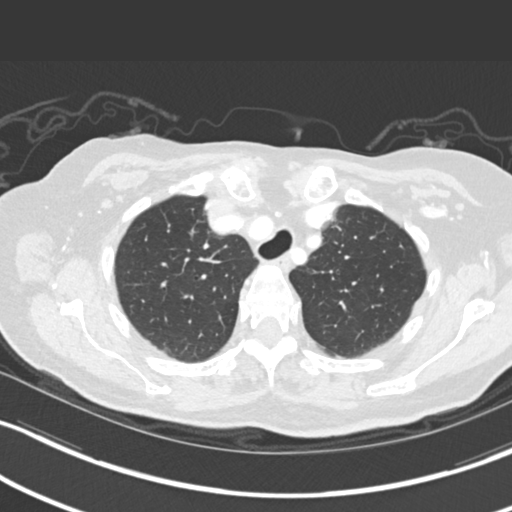
[im 117/138  lung]
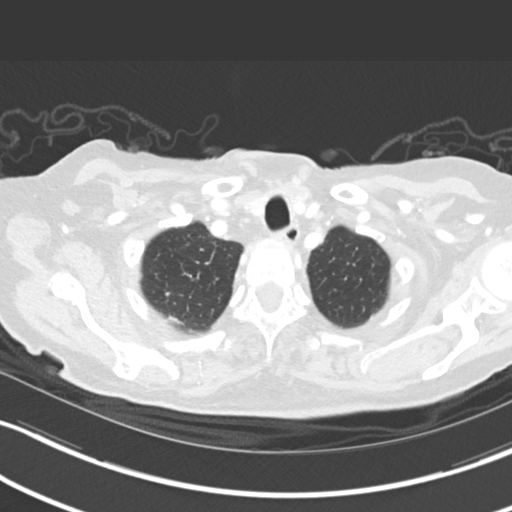
[im 127/138  lung]
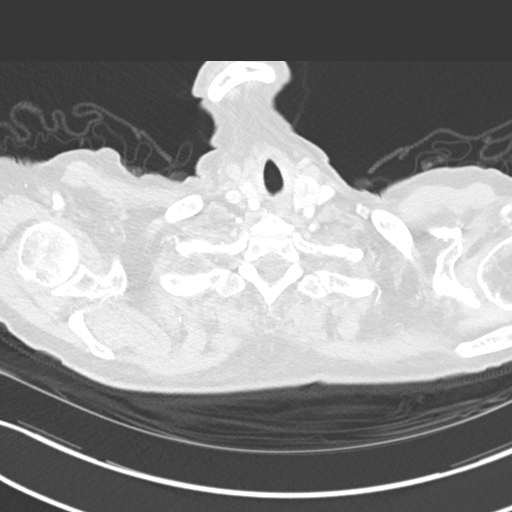

[Series 5: coronal · coronal · 0.57mm/px · 3 of 129 slices shown]
[im 26/129  lung]
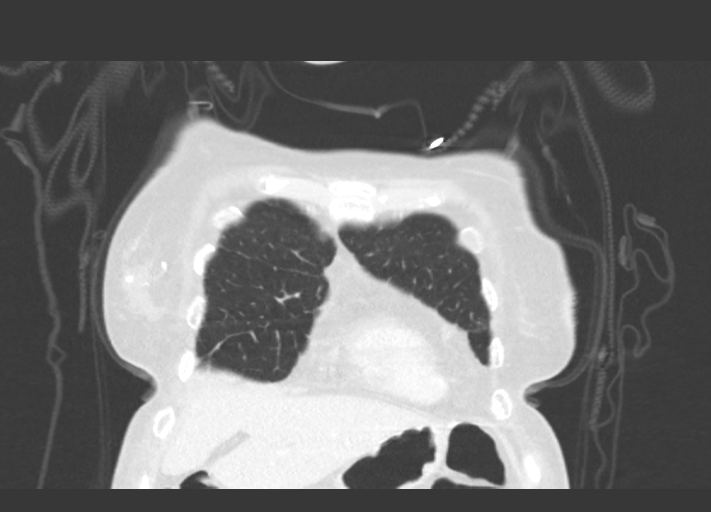
[im 52/129  lung]
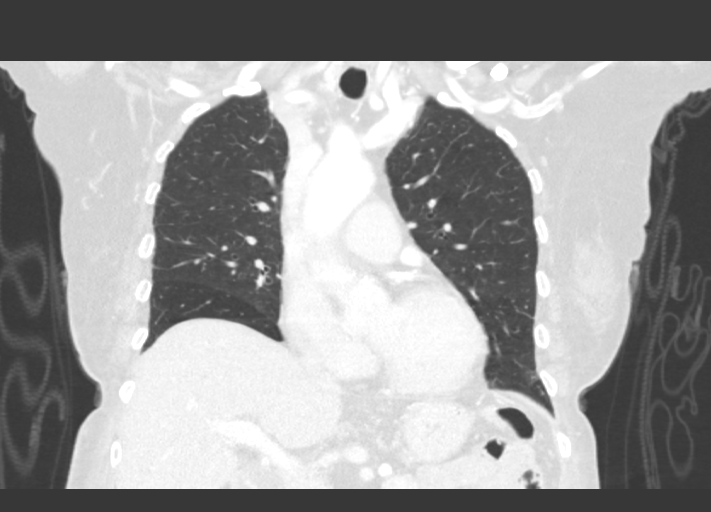
[im 77/129  lung]
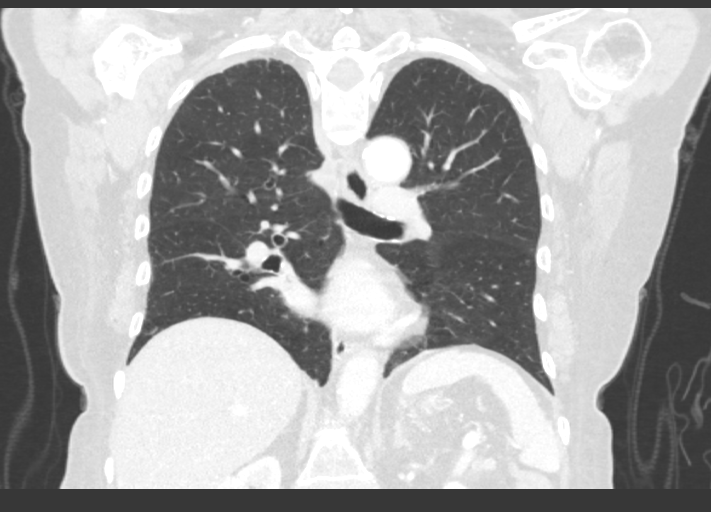

[15 of 36 positions shown; findings below may reference images not displayed]

FINDINGS: Cardiovascular: The heart is normal in size. No pericardial
effusion. Mild tortuosity of the thoracic aorta but no aortic
aneurysm or dissection. Moderate atherosclerotic calcifications. The
branch vessels are patent. Scattered three-vessel coronary artery
calcifications are noted.

Mediastinum/Nodes: No mediastinal or hilar mass or adenopathy. Small
scattered lymph nodes are noted. The esophagus is grossly normal.

Lungs/Pleura: No worrisome pulmonary nodules to suggest pulmonary
metastatic disease. Mild dependent subpleural atelectasis but no
infiltrates or effusions. No interstitial lung disease or
bronchiectasis.

Upper Abdomen: Stable upper abdominal findings including biliary
dilatation and pancreatic cysts.

Musculoskeletal: No worrisome bone lesions are identified. Remote
thoracic spine compression fractures with vertebral augmentation
changes at T11
IMPRESSION: 1. No CT findings for pulmonary metastatic disease.
2. No mediastinal or hilar mass or adenopathy.
3. Stable upper abdominal findings including biliary dilatation and
pancreatic cysts.
4. Remote thoracic spine compression fractures with vertebral
augmentation changes at T11.
5. Aortic atherosclerosis.

Aortic Atherosclerosis ([id number]).

## 2022-02-16 MED ORDER — PRIMIDONE 50 MG PO TABS: 50.0000 mg | ORAL_TABLET | Freq: Every day | ORAL | 3 refills | Status: DC

## 2022-02-16 MED ORDER — PHENYTOIN SODIUM EXTENDED 300 MG PO CAPS: 300.0000 mg | ORAL_CAPSULE | Freq: Every day | ORAL | 0 refills | Status: DC

## 2022-02-16 NOTE — Progress Notes (Signed)
[facility]  PATIENT: [name] DOB: November 08, 1929  REQUESTING CLINICIAN: Bartholome Bill, MD HISTORY FROM: Patient, niece and chart review  REASON FOR VISIT: Seizure   HISTORICAL  CHIEF COMPLAINT:  Chief Complaint  Patient presents with   New Patient (Initial Visit)    Rm 12. Accompanied by niece. Former Dr [name] pt, Seizure disorder. Pt states she is taking 250 MG of Dilantin.    HISTORY OF PRESENT ILLNESS:  This is a 88 year old woman with multiple medical conditions including seizure disorder, essential tremor, type 2 diabetes mellitus, hypertension, right frontal meningioma s/p resection in 2015, colon cancer who is presenting for follow-up for seizure disorder.  Patient was previously seen by Dr. Jannifer [name] and Dr. Leta [name] for her seizures.  His last visit with Dr. Tish [name] was in February 2023, at that time plan was to continue with phenytoin 200 mg in the evening and also primidone 250 mg in the morning for her essential tremor.  In August she was admitted to the hospital for seizure work-up versus syncope.  Patient reports she was at home with a friend and then passed out on the couch, the friend could not arouse her and therefore she called EMS.  In the ED, was noted to be hyponatremic to 129, was observed in the emergency department and her condition continued to improve after IV hydration.  On discharge, she was recommended to increase her Dilantin to 300 mg.  Since her hospitalization in August she has not had any additional seizures.  She did present to the hospital however for small bowel obstruction.  In terms of the seizure we have checked her Dilantin which was low and plan was to increase it to 350 but due to miscommunication patient is actually taking 250 mg of Dilantin nightly, again no seizures.  In terms of the tremors, she reports that tremors are still bothersome with eating, she spilled her food has difficulty using a fork she is currently  on primidone to 50 in the morning    PRIOR HPI (12/24/20; Dr. Jannifer [name]): "[name] is an 88 year old right-handed white female with a history of a seizure disorder, her last seizure was on 19 Acoff 2022.  The patient was in an extended care facility at that time and she is not completely sure that she was getting her medications properly.  She has a chronic gait disorder, she has fallen on occasion, she has not fallen recently.  She uses a walker to get around.  She has an essential tremor that affects her ability to feed herself at times.  She is on Mysoline taking 150 mg in the morning and 100 mg in the evening.  She has history of diabetes.  She continues to be concerned about the tremor.  She wishes to have something done about this issue.  She questions whether ultrasound ablation of the brain Rede be something she could consider."   PRIOR HPI (08/15/14, VRP): 88 year old female here for evaluation of tongue laceration and possible seizures. In 2015 patient was having intermittent episodes of falling down. There is question of whether she had unresponsiveness with these events. She was initially evaluated for syncopal workup with heart testing. Ultimately she was admitted to the hospital in June 2015 for confusion, altered mental status, recurrent falls. She's found to have a large right frontal meningioma and right subdural hematoma. Patient was treated with surgical resection, Decadron and antiseizure medication. Neurology consult was obtained who felt patient was also having intermittent complex partial  seizures. She was discharged on Keppra 500 mg twice a day. 2 weeks after discharge patient stopped his medication on her own due to side effects of jitteriness, dizziness.   Patient continues to live on her own, continues to have intermittent falls and confusion spells. In 07/18/2014 patient woke up with large amount of blood on her pillow and bed sheets. She had a severe tongue laceration. No muscle aches,  incontinence, confusion. The next day patient went to the hospital for kyphoplasty procedure. On 07/20/2014 patient went to the emergency room for evaluation of possible infection of tongue laceration. She followed up with ENT Dr. Constance Holster. Due to history of meningioma and seizures, patient was referred back to me for consultation.   Patient still drives, short distances to church and grocery store. She is also had lower extremity weakness and numbness. She has diabetes   Previous ASMs: Levetiracetam, Lamotrigine, Carbamazepine, Topiramate   Currenty ASMs: Phenytoin 250 mg (she should be taking 350 mg), Primidone 250 mg     OTHER MEDICAL CONDITIONS: Seizure disorder, essential tremor, type 2 diabetes mellitus, hypertension, right frontal meningioma s/p resection in 2015, colon cancer  REVIEW OF SYSTEMS: Full 14 system review of systems performed and negative with exception of: As noted in the HPI   ALLERGIES: Allergies  Allergen Reactions   Bystolic [Nebivolol Hcl] Other (See Comments)    "extreme weakness, heaviness in legs & arms, swelling in legs/arms/face, swollen abdomen, pain in bladder, feet pain, soreness in chest"   Carbamazepine Other (See Comments)    Blood poisoning   Cholestyramine Itching, Nausea And Vomiting, Rash and Other (See Comments)    "itching rash on stomach, bloated, nausea, vomiting, sleeplessness, extreme pain in arms"  Patient is taking this in 2022.   Morphine Other (See Comments)    Feels morbid, weak, still in pain   Niacin Palpitations    Fast heart beat Other reaction(s): Unknown   Niaspan [Niacin Er] Palpitations and Other (See Comments)    "fast heart beat, high blood pressure"   Norvasc [Amlodipine Besylate] Other (See Comments)    "extreme fluid retention/pain)   Optivar [Azelastine Hcl] Photosensitivity   Repaglinide Hives   Sular [Nisoldipine Er] Other (See Comments)    "severe headaches, swelling eyes, hands, feet, shortness of breath, weak,  flushed face, brain boiling, fluid retention, high blood sugar, nervous, heart fast beating"   Telmisartan Other (See Comments)    "headache, difficulty urinating, high blood sugar, fluid retention"   Amoxicillin-Pot Clavulanate Rash    Tolerates Unasyn (>10 doses)   Cefdinir Swelling    Vaginal irritation, breathing,    Ciprofloxacin Hcl Hives   Clonidine Other (See Comments)    Dry mouth, fluid retention   Clonidine Hydrochloride Other (See Comments)    Dry mouth, fluid retention   Codeine Nausea And Vomiting   Ezetimibe Other (See Comments)    Made weak   Hydroxychloroquine Other (See Comments)    Low platlets   Naproxen Other (See Comments)    Shrinks bladder   Sulfa Antibiotics Rash   Ziac [Bisoprolol-Hydrochlorothiazide] Other (See Comments)    "stopped urination"   Amlodipine Besy-Benazepril Hcl Cough   Azelastine Other (See Comments)    Unknown reaction - Per MAR   Elavil [Amitriptyline] Other (See Comments)    Gave Pt nightmares   Empagliflozin Other (See Comments)    "Caused yeast infection, slowed my urine"   Hydralazine Other (See Comments)    "does not reduce high blood pressure, pain in arm,  high pressure, felt like I was on verge of heart attack, really weak"   Iodine I 131 Tositumomab Other (See Comments)    Unknown reaction - Per MAR   Kenalog [Triamcinolone] Diarrhea    Per MAR   Keppra [Levetiracetam] Other (See Comments)    Shaking   Lamotrigine Itching and Other (See Comments)   Misc. Sulfonamide Containing Compounds    Pregabalin Swelling and Other (See Comments)    Weight gain   Pseudoephedrine Other (See Comments)    Unknown reaction   Pseudoephedrine-Guaifenesin Er Other (See Comments)    Unknown reaction - Per MAR   Risedronate     Unknown reaction - Per MAR   Ru-Hist D [Brompheniramine-Phenylephrine] Other (See Comments)    Unknown reaction - Per MAR   Sumatriptan Other (See Comments)   Topiramate Other (See Comments)    Dry eyes   Ace  Inhibitors Other (See Comments)    unknown   Actonel [Risedronate Sodium] Other (See Comments)    unknown   Aspirin Other (See Comments)    Unknown reaction   Atacand [Candesartan] Other (See Comments)    Unknown reaction - MAR   Bextra [Valdecoxib] Other (See Comments)    Unknown reaction - Per MAR   Bisoprolol-Hydrochlorothiazide Other (See Comments)    Unknown reaction - Per MAR   Cefadroxil Other (See Comments)    Unknown reaction   Celecoxib Rash   Hydrocodone Other (See Comments)    unknown   Hydrocodone-Acetaminophen Other (See Comments)    unknown   Iodinated Contrast Media Rash and Other (See Comments)    "All over" rash   Meloxicam Other (See Comments)    unknown   Methylprednisolone Sodium Succinate Other (See Comments)    unknown   Nabumetone Other (See Comments)    Unknown reaction   Penicillins Other (See Comments)    unknown   Pseudoephedrine-Guaifenesin Other (See Comments)    unknown   Risedronate Sodium Other (See Comments)    unknown   Rofecoxib Other (See Comments)    Unknown reaction - MAR Other reaction(s): Unknown   Ru-Tuss [Chlorphen-Pse-Atrop-Hyos-Scop] Other (See Comments)    unknown   Sulfonamide Derivatives Other (See Comments)    unknown   Sulphur [Elemental Sulfur] Other (See Comments)    unknown   Telithromycin Other (See Comments)    unknown   Terfenadine Other (See Comments)    Unknown reaction - Per MAR   Trandolapril-Verapamil Hcl Er Other (See Comments)    Headache, difficulty urinating, high blood sugar, fluid retention  Pt is taking Tarka (trandolapril-verapamil) currently, but requests the medication stay in her allergy list   Trandolapril-Verapamil Hcl Er Other (See Comments)    Headache, difficulty urinating, high blood sugar, fluid retention  Pt is taking Tarka (trandolapril-verapamil) currently, but requests the medication stay in her allergy list   Valium [Diazepam] Other (See Comments)    Makes her mean and hyper     HOME MEDICATIONS: Outpatient Medications Prior to Visit  Medication Sig Dispense Refill   acetaminophen (TYLENOL) 500 MG tablet Take 2 tablets (1,000 mg total) by mouth 3 (three) times daily. (Patient taking differently: Take 1,000 mg by mouth every 8 (eight) hours as needed for moderate pain.) 30 tablet 0   ALPRAZolam (XANAX) 0.25 MG tablet Take 1 tablet (0.25 mg total) by mouth 2 (two) times daily as needed for anxiety. 10 tablet 0   Biotin 5000 MCG CAPS Take 5,000 mcg by mouth daily.     Blood  Glucose Monitoring Suppl (PRODIGY VOICE BLOOD GLUCOSE) w/Device KIT Use to check blood sugar 1 time per day. (Patient taking differently: 1 each by Other route See admin instructions. Use to check blood sugar 1 time per day.) 1 each 2   capsicum (ZOSTRIX) 0.075 % topical cream Apply 1 Application topically 4 (four) times daily as needed (pain).     Cholecalciferol (VITAMIN D-3) 125 MCG (5000 UT) TABS Take 5,000 Units by mouth daily.     cholestyramine (QUESTRAN) 4 g packet Take 4 g by mouth 2 (two) times daily.     cyanocobalamin (,VITAMIN B-12,) 1000 MCG/ML injection Inject 1,000 mcg into the muscle every 21 ( twenty-one) days.     diclofenac Sodium (VOLTAREN) 1 % GEL Apply 2 g topically 4 (four) times daily as needed (for knee and back pain).     diphenhydrAMINE (BENADRYL) 25 mg capsule Take 25 mg by mouth every 8 (eight) hours as needed for allergies or sleep.     glucose blood (GLUCOSE METER TEST) test strip See admin instructions.     Glucose Blood (PRODIGY VOICE BLOOD GLUCOSE VI)      insulin aspart (NOVOLOG) 100 UNIT/ML FlexPen Inject 2-12 Units into the skin See admin instructions. Injects 2-10 units of insulin under the skin 3 times a day per sliding scale: CBG 0-150: 2 units; CBG 151-200: 4 units; CBG 201-250: 6 units; CBG 251-300: 8 units; CBG 301-350: 10 units; CBG 351-400: 12 units; CBG >400: 12 units and notify the MD/NP (Patient taking differently: Inject 2-12 Units into the skin See  admin instructions. Inject 2-10 units into the skin 3 times a day per sliding scale: CBG 0-150: 2 units; CBG 151-200: 4 units; CBG 201-250: 6 units; CBG 251-300: 8 units; CBG 301-350: 10 units; CBG 351-400: 12 units; CBG >400: 12 units and notify the MD/NP) 15 mL 0   insulin glargine (LANTUS SOLOSTAR) 100 UNIT/ML Solostar Pen Inject 10 Units into the skin at bedtime. (Patient taking differently: Inject 10 Units into the skin See admin instructions. Inject 10 units into the skin every afternoon- when eating)     Insulin Pen Needle 32G X 4 MM MISC Used to inject insulin 3x daily (Patient taking differently: 1 each by Other route See admin instructions. Used to inject insulin 3x daily) 270 each 2   levothyroxine (SYNTHROID) 75 MCG tablet Take 1 tablet (75 mcg total) by mouth daily before breakfast. 30 tablet 0   pantoprazole (PROTONIX) 40 MG tablet Take 40 mg by mouth daily.     phenytoin (DILANTIN) 50 MG tablet Chew 1 tablet (50 mg total) by mouth daily. 90 tablet 3   Polyethyl Glycol-Propyl Glycol (SYSTANE) 0.4-0.3 % SOLN Place 2 drops into both eyes 3 (three) times daily.     polyethylene glycol (MIRALAX / GLYCOLAX) 17 g packet Take 17 g by mouth daily as needed for mild constipation.     primidone (MYSOLINE) 250 MG tablet Take 250 mg by mouth at bedtime.     thiamine 250 MG tablet Take 250 mg by mouth daily.     traMADol (ULTRAM) 50 MG tablet Take 1 tablet (50 mg total) by mouth every 8 (eight) hours as needed for moderate pain. 10 tablet 0   Verapamil HCl CR 200 MG CP24 Take 200 mg by mouth daily.     phenytoin (DILANTIN) 300 MG ER capsule Take 1 capsule (300 mg total) by mouth at bedtime. (Patient taking differently: Take 200 mg by mouth at bedtime.) 30 capsule 0  feeding supplement, GLUCERNA SHAKE, (GLUCERNA SHAKE) LIQD Take 237 mLs by mouth daily as needed (for poor appetite).     No facility-administered medications prior to visit.    PAST MEDICAL HISTORY: Past Medical History:  Diagnosis  Date   Anemia    Anxiety    Asthma    Breast cancer ([place]) 2014   right breast   Cancer of right breast ([place]) 12/26/12   right breast 12:00 o'clock, DCIS   Carotid artery disease (HCC)    Carpal tunnel syndrome, bilateral    Chronic bronchitis (HCC)    Chronic cough    Chronic facial pain    Chronic foot pain    Colon cancer ([place]) dx'd 0/2725   Complication of anesthesia    Sore jaw; could not chew or move mouth, prolonged sedation   Convulsions/seizures ([place]) 10/16/2014   Diabetes mellitus    type 2 niddm x 20 years   Dyslipidemia    Ejection fraction    Gait abnormality 12/04/2019   GERD (gastroesophageal reflux disease)    Hammer toe    bilateral   History of colonic polyps    History of meningioma    HTN (hypertension)    Hx of radiation therapy 03/07/13- 03/29/13   right breast 4250 cGy 17 sessions   Hyperlipidemia    Hypokalemia    Hyponatremia    Hypothyroidism    IBS (irritable bowel syndrome)    Melanoma ([place])    Metatarsal bone fracture 2014   Multiple drug allergies    Nontoxic thyroid nodule    Obesity    Osteoarthritis    Osteoporosis    Palpitations    Personal history of radiation therapy 2014   Seasonal allergies    Skin cancer    Syncope    Tremor, essential 08/18/2016   Vitamin B12 deficiency    Vitamin D deficiency     PAST SURGICAL HISTORY: Past Surgical History:  Procedure Laterality Date   ABDOMINAL HYSTERECTOMY     BIOPSY  11/07/2019   Procedure: BIOPSY;  Surgeon: Ronald Lobo, MD;  Location: [facility];  Service: Endoscopy;;   BRAIN SURGERY     BREAST BIOPSY Right 01/24/2013   Procedure: RE-EXCICION OF BREAST CANCER, ANTERIOR MARGINS;  Surgeon: Edward Jolly, MD;  Location: [facility];  Service: General;  Laterality: Right;   BREAST LUMPECTOMY Right 2014   BREAST LUMPECTOMY WITH NEEDLE LOCALIZATION Right 01/17/2013   Procedure: BREAST LUMPECTOMY WITH NEEDLE LOCALIZATION;  Surgeon: Edward Jolly, MD;  Location: [facility];  Service:  General;  Laterality: Right;   BUNIONECTOMY Bilateral    CATARACT EXTRACTION W/ INTRAOCULAR LENS IMPLANT Right    CHOLECYSTECTOMY     COLONOSCOPY     COLONOSCOPY WITH PROPOFOL N/A 11/07/2019   Procedure: COLONOSCOPY WITH PROPOFOL;  Surgeon: Ronald Lobo, MD;  Location: [facility];  Service: Endoscopy;  Laterality: N/A;   CRANIOTOMY Right 10/18/2013   Procedure: CRANIOTOMY TUMOR EXCISION;  Surgeon: Floyce Stakes, MD;  Location: [facility];  Service: Neurosurgery;  Laterality: Right;   ESOPHAGOGASTRODUODENOSCOPY (EGD) WITH PROPOFOL N/A 11/07/2019   Procedure: ESOPHAGOGASTRODUODENOSCOPY (EGD) WITH PROPOFOL;  Surgeon: Ronald Lobo, MD;  Location: [facility];  Service: Endoscopy;  Laterality: N/A;   EYE SURGERY     HERNIA REPAIR     IR THORACENTESIS ASP PLEURAL SPACE W/IMG GUIDE  04/01/2020   KNEE ARTHROSCOPY Bilateral    LAPAROSCOPIC RIGHT HEMI COLECTOMY Right 03/06/2020   Procedure: LAPAROSCOPIC RIGHT HEMI COLECTOMY WITH TAP BLOCK AND LYSIS OF  ADHESIONS;  Surgeon: Ileana Roup, MD;  Location: [facility];  Service: General;  Laterality: Right;   POLYPECTOMY     small adenomatous   TOTAL HIP ARTHROPLASTY Right 06/05/2020   Procedure: ARTHROPLASTY  ANTERIOR APPROACH. RIGHT HIP;  Surgeon: Rod Can, MD;  Location: [facility];  Service: Orthopedics;  Laterality: Right;   ULNAR TUNNEL RELEASE      FAMILY HISTORY: Family History  Problem Relation Age of Onset   Heart disease Mother    Osteoporosis Mother    Diabetes Father    Pancreatic cancer Father    Bone cancer Sister    Rectal cancer Sister    Thyroid disease Sister        benign goiter resected   Prostate cancer Brother    Colon cancer Brother    Colon cancer Other     SOCIAL HISTORY: Social History   Socioeconomic History   Marital status: Widowed    Spouse name: Not on file   Number of children: 0   Years of education: college   Highest education level: Not on file  Occupational History   Occupation:  retired  Tobacco Use   Smoking status: Never   Smokeless tobacco: Never  Vaping Use   Vaping Use: Never used  Substance and Sexual Activity   Alcohol use: No   Drug use: No   Sexual activity: Not Currently    Comment: menarche age 57, hysterectomy age 71, HRT x 2-3 mos, G1- miscarriage  Other Topics Concern   Not on file  Social History Narrative   12/24/20 Widowed, lives alone. Has caregivers 8 hrs a day, no one at night- 12/29/21     Ambulates independently, uses walker at home intermittently    Drinks decaf only   Patient is right handed.   Social Determinants of Health   Financial Resource Strain: Not on file  Food Insecurity: No Food Insecurity (01/24/2022)   Hunger Vital Sign    Worried About Running Out of Food in the Last Year: Never true    Ran Out of Food in the Last Year: Never true  Transportation Needs: No Transportation Needs (01/24/2022)   PRAPARE - Hydrologist (Medical): No    Lack of Transportation (Non-Medical): No  Physical Activity: Not on file  Stress: Not on file  Social Connections: Not on file  Intimate Partner Violence: Not At Risk (01/24/2022)   Humiliation, Afraid, Rape, and Kick questionnaire    Fear of Current or Ex-Partner: No    Emotionally Abused: No    Physically Abused: No    Sexually Abused: No    PHYSICAL EXAM  GENERAL EXAM/CONSTITUTIONAL: Vitals:  Vitals:   02/16/22 1252  BP: (!) 145/79  Pulse: 76  Weight: 140 lb (63.5 kg)  Height: 5' 7.5" (1.715 m)   Body mass index is 21.6 kg/m. Wt Readings from Last 3 Encounters:  02/16/22 140 lb (63.5 kg)  01/29/22 144 lb 6.4 oz (65.5 kg)  12/21/21 130 lb (59 kg)   Patient is in no distress; well developed, nourished and groomed; neck is supple  EYES: Pupils round and reactive to light, Visual fields full to confrontation, Extraocular movements intacts,  No results found.  MUSCULOSKELETAL: Gait, strength, tone, movements noted in Neurologic exam  below  NEUROLOGIC: MENTAL STATUS:     03/28/2018   11:47 AM 08/31/2017   10:44 AM  MMSE - Mini Mental State Exam  Orientation to time 5 5  Orientation to Place  4 5  Registration 3 3  Attention/ Calculation 5 4  Recall 2 2  Language- name 2 objects 2 2  Language- repeat 1 1  Language- follow 3 step command 3 3  Language- read & follow direction 1 1  Write a sentence 1 1  Copy design 1 0  Total score 28 27   awake, alert, oriented to person, place and time recent and remote memory intact normal attention and concentration language fluent, comprehension intact, naming intact fund of knowledge appropriate  CRANIAL NERVE:  2nd, 3rd, 4th, 6th - pupils equal and reactive to light, visual fields full to confrontation, extraocular muscles intact, no nystagmus 5th - facial sensation symmetric 7th - facial strength symmetric 8th - hearing intact 9th - palate elevates symmetrically, uvula midline 11th - shoulder shrug symmetric 12th - tongue protrusion midline  MOTOR:  normal bulk and tone, at least antigravity in the BUE and BLE  SENSORY:  normal and symmetric to light touch  COORDINATION:  There is presence of resting tremors and action tremors    GAIT/STATION:  Use a walker      DIAGNOSTIC DATA (LABS, IMAGING, TESTING) - I reviewed patient records, labs, notes, testing and imaging myself where available.  Lab Results  Component Value Date   WBC 6.0 01/28/2022   HGB 10.6 (L) 01/28/2022   HCT 30.7 (L) 01/28/2022   MCV 90.6 01/28/2022   PLT 300 01/28/2022      Component Value Date/Time   NA 132 (L) 01/29/2022 0628   NA 130 (L) 12/24/2020 1143   NA 138 01/04/2013 1212   K 3.0 (L) 01/29/2022 0628   K 3.9 01/04/2013 1212   CL 98 01/29/2022 0628   CO2 26 01/29/2022 0628   CO2 22 01/04/2013 1212   GLUCOSE 249 (H) 01/29/2022 0628   GLUCOSE 285 (H) 01/04/2013 1212   BUN <5 (L) 01/29/2022 0628   BUN 14 12/24/2020 1143   BUN 10.3 01/04/2013 1212   CREATININE  0.56 01/29/2022 0628   CREATININE 0.78 12/01/2013 1659   CREATININE 1.0 01/04/2013 1212   CALCIUM 7.9 (L) 01/29/2022 0628   CALCIUM 9.6 01/04/2013 1212   PROT 6.4 (L) 01/23/2022 2246   PROT 6.3 12/24/2020 1143   PROT 6.4 01/04/2013 1212   ALBUMIN 3.4 (L) 01/23/2022 2246   ALBUMIN 4.2 12/24/2020 1143   ALBUMIN 3.6 01/04/2013 1212   AST 26 01/23/2022 2246   AST 14 01/04/2013 1212   ALT 29 01/23/2022 2246   ALT 16 01/04/2013 1212   ALKPHOS 95 01/23/2022 2246   ALKPHOS 52 01/04/2013 1212   BILITOT 0.4 01/23/2022 2246   BILITOT <0.2 12/24/2020 1143   BILITOT 1.01 01/04/2013 1212   GFRNONAA >60 01/29/2022 0628   GFRAA >60 01/02/2020 0455   Lab Results  Component Value Date   CHOL 148 01/30/2013   HDL 47.10 01/30/2013   LDLCALC 65 01/30/2013   LDLDIRECT 85.7 09/05/2009   TRIG 179.0 (H) 01/30/2013   Lab Results  Component Value Date   HGBA1C 6.4 (H) 01/24/2022   Lab Results  Component Value Date   VITAMINB12 2,750 (H) 06/04/2020   Lab Results  Component Value Date   TSH 3.062 12/21/2021    Phenytoin level 7.9 (01/13/22) 7.4 (01/06/22) 11.3 (12/24/20)    ASSESSMENT AND PLAN  88 y.o. year old female  with seizure disorder, essential tremor, type 2 diabetes mellitus, hypertension, right frontal meningioma s/p resection in 2015, colon cancer who is presenting for seizure follow-up after being admitted  in the hospital for in August for seizure versus syncope and found to be hyponatremic. Since that hospitalization, patient has not had any additional seizures.  Due to miscommunication she is on phenytoin 250 instead of 350 mg and the last level was low at 7.9.  At this time we will increase the phenytoin to 350 mg nightly.  In terms of the essential tremor, she continues to report difficulty with writing, difficulty with eating therefore we will increase the primidone to 250 mg in the morning and 50 in the evening and will titrate the afternoon dose as tolerated.  Continue to follow  with PCP and at the advised patient to contact me if she has any breakthrough seizure or any other concern.  She voices understanding     1. Seizure disorder (HCC)   2. Gait abnormality   3. Essential tremor     Patient Instructions  Continue with Phenytoin 350 mg nightly  Continue with Primidone 250 mg in the morning and will add 50 mg in the evening  Continue your other medications  Follow up in 3 months    Per [facility] statutes, patients with seizures are not allowed to drive until they have been seizure-free for six months.  Other recommendations include using caution when using heavy equipment or power tools. Avoid working on ladders or at heights. Take showers instead of baths.  Do not swim alone.  Ensure the water temperature is not too high on the home water heater. Do not go swimming alone. Do not lock yourself in a room alone (i.e. bathroom). When caring for infants or small children, sit down when holding, feeding, or changing them to minimize risk of injury to the child in the event you have a seizure. Maintain good sleep hygiene. Avoid alcohol.  Also recommend adequate sleep, hydration, good diet and minimize stress.   During the Seizure  - First, ensure adequate ventilation and place patients on the floor on their left side  Loosen clothing around the neck and ensure the airway is patent. If the patient is clenching the teeth, do not force the mouth open with any object as this can cause severe damage - Remove all items from the surrounding that can be hazardous. The patient Lampron be oblivious to what's happening and Lyer[place] not even know what he or she is doing. If the patient is confused and wandering, either gently guide him/her away and block access to outside areas - Reassure the individual and be comforting - Call 911. In most cases, the seizure ends before EMS arrives. However, there are cases when seizures Frese last over 3 to 5 minutes. Or the individual Turman have  developed breathing difficulties or severe injuries. If a pregnant patient or a person with diabetes develops a seizure, it is prudent to call an ambulance. - Finally, if the patient does not regain full consciousness, then call EMS. Most patients will remain confused for about 45 to 90 minutes after a seizure, so you must use judgment in calling for help. - Avoid restraints but make sure the patient is in a bed with padded side rails - Place the individual in a lateral position with the neck slightly flexed; this will help the saliva drain from the mouth and prevent the tongue from falling backward - Remove all nearby furniture and other hazards from the area - Provide verbal assurance as the individual is regaining consciousness - Provide the patient with privacy if possible - Call for help and  start treatment as ordered by the caregiver   After the Seizure (Postictal Stage)  After a seizure, most patients experience confusion, fatigue, muscle pain and/or a headache. Thus, one should permit the individual to sleep. For the next few days, reassurance is essential. Being calm and helping reorient the person is also of importance.  Most seizures are painless and end spontaneously. Seizures are not harmful to others but can lead to complications such as stress on the lungs, brain and the heart. Individuals with prior lung problems [place] develop labored breathing and respiratory distress.     No orders of the defined types were placed in this encounter.   Meds ordered this encounter  Medications   phenytoin (DILANTIN) 300 MG ER capsule    Sig: Take 1 capsule (300 mg total) by mouth at bedtime.    Dispense:  90 capsule    Refill:  0   primidone (MYSOLINE) 50 MG tablet    Sig: Take 1 tablet (50 mg total) by mouth at bedtime.    Dispense:  90 tablet    Refill:  3    Return in about 3 months (around 05/19/2022).  I have spent a total of 45 minutes dedicated to this patient today, preparing to  see patient, performing a medically appropriate examination and evaluation, ordering tests and/or medications and procedures, and counseling and educating the patient/family/caregiver; independently interpreting result and communicating results to the family/patient/caregiver; and documenting clinical information in the electronic medical record.   Alric Ran, MD 02/16/2022, 9:09 PM  [facility] [address], [place], [place] [address] [phone]

## 2022-02-16 NOTE — Patient Instructions (Signed)
Continue with Phenytoin 350 mg nightly  Continue with Primidone 250 mg in the morning and will add 50 mg in the evening  Continue your other medications  Follow up in 3 months

## 2022-02-17 DIAGNOSIS — I1310 Hypertensive heart and chronic kidney disease without heart failure, with stage 1 through stage 4 chronic kidney disease, or unspecified chronic kidney disease: Secondary | ICD-10-CM | POA: Diagnosis not present

## 2022-02-17 DIAGNOSIS — D631 Anemia in chronic kidney disease: Secondary | ICD-10-CM | POA: Diagnosis not present

## 2022-02-17 DIAGNOSIS — C253 Malignant neoplasm of pancreatic duct: Secondary | ICD-10-CM | POA: Diagnosis not present

## 2022-02-17 DIAGNOSIS — E1122 Type 2 diabetes mellitus with diabetic chronic kidney disease: Secondary | ICD-10-CM | POA: Diagnosis not present

## 2022-02-17 DIAGNOSIS — N189 Chronic kidney disease, unspecified: Secondary | ICD-10-CM | POA: Diagnosis not present

## 2022-02-17 DIAGNOSIS — D630 Anemia in neoplastic disease: Secondary | ICD-10-CM | POA: Diagnosis not present

## 2022-02-18 ENCOUNTER — Telehealth: Payer: Self-pay | Admitting: Neurology

## 2022-02-18 DIAGNOSIS — E1122 Type 2 diabetes mellitus with diabetic chronic kidney disease: Secondary | ICD-10-CM | POA: Diagnosis not present

## 2022-02-18 DIAGNOSIS — I1310 Hypertensive heart and chronic kidney disease without heart failure, with stage 1 through stage 4 chronic kidney disease, or unspecified chronic kidney disease: Secondary | ICD-10-CM | POA: Diagnosis not present

## 2022-02-18 DIAGNOSIS — D630 Anemia in neoplastic disease: Secondary | ICD-10-CM | POA: Diagnosis not present

## 2022-02-18 DIAGNOSIS — C253 Malignant neoplasm of pancreatic duct: Secondary | ICD-10-CM | POA: Diagnosis not present

## 2022-02-18 DIAGNOSIS — N189 Chronic kidney disease, unspecified: Secondary | ICD-10-CM | POA: Diagnosis not present

## 2022-02-18 DIAGNOSIS — D631 Anemia in chronic kidney disease: Secondary | ICD-10-CM | POA: Diagnosis not present

## 2022-02-18 NOTE — Telephone Encounter (Signed)
Beth,RN @ [name] reports pt feels dizzy as a result of phenytoin (DILANTIN) 300 MG ER capsule, Beth said when she saw pt she was not dizzy.  She welcomes a call back if RN wants to discuss concerns re: the medication

## 2022-02-23 NOTE — Telephone Encounter (Signed)
I attempted to reach the pt both at her home and mobile number to discuss how she is doing on on the Dilantin. No answer at home # or mobile number ( vm's not available).  Will wait to hear back from the pt on this message.

## 2022-02-25 DIAGNOSIS — E1122 Type 2 diabetes mellitus with diabetic chronic kidney disease: Secondary | ICD-10-CM | POA: Diagnosis not present

## 2022-02-25 DIAGNOSIS — I1310 Hypertensive heart and chronic kidney disease without heart failure, with stage 1 through stage 4 chronic kidney disease, or unspecified chronic kidney disease: Secondary | ICD-10-CM | POA: Diagnosis not present

## 2022-02-25 DIAGNOSIS — C253 Malignant neoplasm of pancreatic duct: Secondary | ICD-10-CM | POA: Diagnosis not present

## 2022-02-25 DIAGNOSIS — D631 Anemia in chronic kidney disease: Secondary | ICD-10-CM | POA: Diagnosis not present

## 2022-02-25 DIAGNOSIS — N189 Chronic kidney disease, unspecified: Secondary | ICD-10-CM | POA: Diagnosis not present

## 2022-02-25 DIAGNOSIS — D630 Anemia in neoplastic disease: Secondary | ICD-10-CM | POA: Diagnosis not present

## 2022-03-02 DIAGNOSIS — L570 Actinic keratosis: Secondary | ICD-10-CM | POA: Diagnosis not present

## 2022-03-02 DIAGNOSIS — L538 Other specified erythematous conditions: Secondary | ICD-10-CM | POA: Diagnosis not present

## 2022-03-02 DIAGNOSIS — L821 Other seborrheic keratosis: Secondary | ICD-10-CM | POA: Diagnosis not present

## 2022-03-02 DIAGNOSIS — D492 Neoplasm of unspecified behavior of bone, soft tissue, and skin: Secondary | ICD-10-CM | POA: Diagnosis not present

## 2022-03-03 ENCOUNTER — Other Ambulatory Visit: Payer: Self-pay | Admitting: Gastroenterology

## 2022-03-03 ENCOUNTER — Ambulatory Visit
Admission: RE | Admit: 2022-03-03 | Discharge: 2022-03-03 | Disposition: A | Discharge status: Home / Self Care | Payer: Medicare Other | Source: Ambulatory Visit | Attending: Gastroenterology | Admitting: Gastroenterology

## 2022-03-03 DIAGNOSIS — K582 Mixed irritable bowel syndrome: Secondary | ICD-10-CM | POA: Diagnosis not present

## 2022-03-03 DIAGNOSIS — K5900 Constipation, unspecified: Secondary | ICD-10-CM

## 2022-03-05 DIAGNOSIS — E1122 Type 2 diabetes mellitus with diabetic chronic kidney disease: Secondary | ICD-10-CM | POA: Diagnosis not present

## 2022-03-05 DIAGNOSIS — I1310 Hypertensive heart and chronic kidney disease without heart failure, with stage 1 through stage 4 chronic kidney disease, or unspecified chronic kidney disease: Secondary | ICD-10-CM | POA: Diagnosis not present

## 2022-03-05 DIAGNOSIS — C253 Malignant neoplasm of pancreatic duct: Secondary | ICD-10-CM | POA: Diagnosis not present

## 2022-03-05 DIAGNOSIS — D630 Anemia in neoplastic disease: Secondary | ICD-10-CM | POA: Diagnosis not present

## 2022-03-05 DIAGNOSIS — N189 Chronic kidney disease, unspecified: Secondary | ICD-10-CM | POA: Diagnosis not present

## 2022-03-05 DIAGNOSIS — D631 Anemia in chronic kidney disease: Secondary | ICD-10-CM | POA: Diagnosis not present

## 2022-03-10 DIAGNOSIS — Z79890 Hormone replacement therapy (postmenopausal): Secondary | ICD-10-CM | POA: Diagnosis not present

## 2022-03-10 DIAGNOSIS — E039 Hypothyroidism, unspecified: Secondary | ICD-10-CM | POA: Diagnosis not present

## 2022-03-10 DIAGNOSIS — Z794 Long term (current) use of insulin: Secondary | ICD-10-CM | POA: Diagnosis not present

## 2022-03-10 DIAGNOSIS — E119 Type 2 diabetes mellitus without complications: Secondary | ICD-10-CM | POA: Diagnosis not present

## 2022-03-11 DIAGNOSIS — E1122 Type 2 diabetes mellitus with diabetic chronic kidney disease: Secondary | ICD-10-CM | POA: Diagnosis not present

## 2022-03-11 DIAGNOSIS — C253 Malignant neoplasm of pancreatic duct: Secondary | ICD-10-CM | POA: Diagnosis not present

## 2022-03-11 DIAGNOSIS — D630 Anemia in neoplastic disease: Secondary | ICD-10-CM | POA: Diagnosis not present

## 2022-03-11 DIAGNOSIS — D631 Anemia in chronic kidney disease: Secondary | ICD-10-CM | POA: Diagnosis not present

## 2022-03-11 DIAGNOSIS — I1310 Hypertensive heart and chronic kidney disease without heart failure, with stage 1 through stage 4 chronic kidney disease, or unspecified chronic kidney disease: Secondary | ICD-10-CM | POA: Diagnosis not present

## 2022-03-11 DIAGNOSIS — N189 Chronic kidney disease, unspecified: Secondary | ICD-10-CM | POA: Diagnosis not present

## 2022-03-13 DIAGNOSIS — E538 Deficiency of other specified B group vitamins: Secondary | ICD-10-CM | POA: Diagnosis not present

## 2022-03-13 DIAGNOSIS — E1122 Type 2 diabetes mellitus with diabetic chronic kidney disease: Secondary | ICD-10-CM | POA: Diagnosis not present

## 2022-03-13 DIAGNOSIS — I779 Disorder of arteries and arterioles, unspecified: Secondary | ICD-10-CM | POA: Diagnosis not present

## 2022-03-13 DIAGNOSIS — Z9181 History of falling: Secondary | ICD-10-CM | POA: Diagnosis not present

## 2022-03-13 DIAGNOSIS — M810 Age-related osteoporosis without current pathological fracture: Secondary | ICD-10-CM | POA: Diagnosis not present

## 2022-03-13 DIAGNOSIS — K219 Gastro-esophageal reflux disease without esophagitis: Secondary | ICD-10-CM | POA: Diagnosis not present

## 2022-03-13 DIAGNOSIS — Z794 Long term (current) use of insulin: Secondary | ICD-10-CM | POA: Diagnosis not present

## 2022-03-13 DIAGNOSIS — I2510 Atherosclerotic heart disease of native coronary artery without angina pectoris: Secondary | ICD-10-CM | POA: Diagnosis not present

## 2022-03-13 DIAGNOSIS — I1310 Hypertensive heart and chronic kidney disease without heart failure, with stage 1 through stage 4 chronic kidney disease, or unspecified chronic kidney disease: Secondary | ICD-10-CM | POA: Diagnosis not present

## 2022-03-13 DIAGNOSIS — G250 Essential tremor: Secondary | ICD-10-CM | POA: Diagnosis not present

## 2022-03-13 DIAGNOSIS — C253 Malignant neoplasm of pancreatic duct: Secondary | ICD-10-CM | POA: Diagnosis not present

## 2022-03-13 DIAGNOSIS — Z85038 Personal history of other malignant neoplasm of large intestine: Secondary | ICD-10-CM | POA: Diagnosis not present

## 2022-03-13 DIAGNOSIS — E039 Hypothyroidism, unspecified: Secondary | ICD-10-CM | POA: Diagnosis not present

## 2022-03-13 DIAGNOSIS — Z853 Personal history of malignant neoplasm of breast: Secondary | ICD-10-CM | POA: Diagnosis not present

## 2022-03-13 DIAGNOSIS — D631 Anemia in chronic kidney disease: Secondary | ICD-10-CM | POA: Diagnosis not present

## 2022-03-13 DIAGNOSIS — N189 Chronic kidney disease, unspecified: Secondary | ICD-10-CM | POA: Diagnosis not present

## 2022-03-13 DIAGNOSIS — K589 Irritable bowel syndrome without diarrhea: Secondary | ICD-10-CM | POA: Diagnosis not present

## 2022-03-13 DIAGNOSIS — E1140 Type 2 diabetes mellitus with diabetic neuropathy, unspecified: Secondary | ICD-10-CM | POA: Diagnosis not present

## 2022-03-13 DIAGNOSIS — Z79899 Other long term (current) drug therapy: Secondary | ICD-10-CM | POA: Diagnosis not present

## 2022-03-13 DIAGNOSIS — G40901 Epilepsy, unspecified, not intractable, with status epilepticus: Secondary | ICD-10-CM | POA: Diagnosis not present

## 2022-03-13 DIAGNOSIS — E876 Hypokalemia: Secondary | ICD-10-CM | POA: Diagnosis not present

## 2022-03-13 DIAGNOSIS — E782 Mixed hyperlipidemia: Secondary | ICD-10-CM | POA: Diagnosis not present

## 2022-03-13 DIAGNOSIS — D630 Anemia in neoplastic disease: Secondary | ICD-10-CM | POA: Diagnosis not present

## 2022-03-13 DIAGNOSIS — F419 Anxiety disorder, unspecified: Secondary | ICD-10-CM | POA: Diagnosis not present

## 2022-03-13 DIAGNOSIS — Z96641 Presence of right artificial hip joint: Secondary | ICD-10-CM | POA: Diagnosis not present

## 2022-03-17 DIAGNOSIS — I10 Essential (primary) hypertension: Secondary | ICD-10-CM | POA: Diagnosis not present

## 2022-03-17 DIAGNOSIS — K120 Recurrent oral aphthae: Secondary | ICD-10-CM | POA: Diagnosis not present

## 2022-03-18 DIAGNOSIS — D630 Anemia in neoplastic disease: Secondary | ICD-10-CM | POA: Diagnosis not present

## 2022-03-18 DIAGNOSIS — D631 Anemia in chronic kidney disease: Secondary | ICD-10-CM | POA: Diagnosis not present

## 2022-03-18 DIAGNOSIS — E1122 Type 2 diabetes mellitus with diabetic chronic kidney disease: Secondary | ICD-10-CM | POA: Diagnosis not present

## 2022-03-18 DIAGNOSIS — I1310 Hypertensive heart and chronic kidney disease without heart failure, with stage 1 through stage 4 chronic kidney disease, or unspecified chronic kidney disease: Secondary | ICD-10-CM | POA: Diagnosis not present

## 2022-03-18 DIAGNOSIS — Z86011 Personal history of benign neoplasm of the brain: Secondary | ICD-10-CM | POA: Insufficient documentation

## 2022-03-18 DIAGNOSIS — N189 Chronic kidney disease, unspecified: Secondary | ICD-10-CM | POA: Diagnosis not present

## 2022-03-18 DIAGNOSIS — C253 Malignant neoplasm of pancreatic duct: Secondary | ICD-10-CM | POA: Diagnosis not present

## 2022-03-19 ENCOUNTER — Telehealth: Payer: Self-pay | Admitting: Neurology

## 2022-03-19 DIAGNOSIS — E1122 Type 2 diabetes mellitus with diabetic chronic kidney disease: Secondary | ICD-10-CM | POA: Diagnosis not present

## 2022-03-19 DIAGNOSIS — D630 Anemia in neoplastic disease: Secondary | ICD-10-CM | POA: Diagnosis not present

## 2022-03-19 DIAGNOSIS — N189 Chronic kidney disease, unspecified: Secondary | ICD-10-CM | POA: Diagnosis not present

## 2022-03-19 DIAGNOSIS — D631 Anemia in chronic kidney disease: Secondary | ICD-10-CM | POA: Diagnosis not present

## 2022-03-19 DIAGNOSIS — I1310 Hypertensive heart and chronic kidney disease without heart failure, with stage 1 through stage 4 chronic kidney disease, or unspecified chronic kidney disease: Secondary | ICD-10-CM | POA: Diagnosis not present

## 2022-03-19 DIAGNOSIS — C253 Malignant neoplasm of pancreatic duct: Secondary | ICD-10-CM | POA: Diagnosis not present

## 2022-03-19 NOTE — Telephone Encounter (Signed)
Beth, RN with [facility] reports today is her last day coming to pt's home.  Pt informed her that weeks ago she stopped taking the phenytoin (DILANTIN) 50 MG tablet because it made her dizzy.  Beth, RN said pt told her she did not call the office to relay.  If there are questions for Beth she can be called at [phone]

## 2022-03-20 NOTE — Telephone Encounter (Signed)
Can we clarify what she is taking and how much she is taking?

## 2022-03-23 NOTE — Telephone Encounter (Signed)
I spoke with the patient. She is taking phenytoin 300 MG ER capsule daily. She stated adding on the phenytoin 50 MG caused dizziness, difficulty with gait. Upon discontinuing the phenytoin 50 MG a few weeks ago her symptoms resolved.  She continues on primidone 250 MG in the morning and primidone 50 MG at night for tremors. She has seen significant benefit with the medication.  She denies beginning any new medications.

## 2022-03-24 NOTE — Telephone Encounter (Signed)
Patient states she is doing well on phenytoin 300 MG. Denies any new seizure activity. She stated she feels like phenytoin 300 MG is "enough for now". She was agreeable to remain on the dosage. She verbalized understanding and expressed appreciation for the call.

## 2022-03-24 NOTE — Telephone Encounter (Signed)
If she is doing well with the residual dose of phenytoin ( 300 mg ) , she will just stay on that dose, primidone can continue as ordered.

## 2022-03-25 DIAGNOSIS — C253 Malignant neoplasm of pancreatic duct: Secondary | ICD-10-CM | POA: Diagnosis not present

## 2022-03-25 DIAGNOSIS — D631 Anemia in chronic kidney disease: Secondary | ICD-10-CM | POA: Diagnosis not present

## 2022-03-25 DIAGNOSIS — E1122 Type 2 diabetes mellitus with diabetic chronic kidney disease: Secondary | ICD-10-CM | POA: Diagnosis not present

## 2022-03-25 DIAGNOSIS — N189 Chronic kidney disease, unspecified: Secondary | ICD-10-CM | POA: Diagnosis not present

## 2022-03-25 DIAGNOSIS — I1310 Hypertensive heart and chronic kidney disease without heart failure, with stage 1 through stage 4 chronic kidney disease, or unspecified chronic kidney disease: Secondary | ICD-10-CM | POA: Diagnosis not present

## 2022-03-25 DIAGNOSIS — D630 Anemia in neoplastic disease: Secondary | ICD-10-CM | POA: Diagnosis not present

## 2022-03-31 ENCOUNTER — Encounter: Payer: Self-pay | Admitting: Podiatry

## 2022-03-31 ENCOUNTER — Ambulatory Visit (INDEPENDENT_AMBULATORY_CARE_PROVIDER_SITE_OTHER): Payer: Medicare Other | Admitting: Podiatry

## 2022-03-31 DIAGNOSIS — B351 Tinea unguium: Secondary | ICD-10-CM | POA: Diagnosis not present

## 2022-03-31 DIAGNOSIS — E1142 Type 2 diabetes mellitus with diabetic polyneuropathy: Secondary | ICD-10-CM

## 2022-03-31 DIAGNOSIS — M79674 Pain in right toe(s): Secondary | ICD-10-CM

## 2022-03-31 DIAGNOSIS — L84 Corns and callosities: Secondary | ICD-10-CM | POA: Diagnosis not present

## 2022-03-31 DIAGNOSIS — M79675 Pain in left toe(s): Secondary | ICD-10-CM | POA: Diagnosis not present

## 2022-04-03 NOTE — Progress Notes (Signed)
Subjective:  Patient ID: [name], female    DOB: 1930-02-08,  MRN: [id number]  [name] presents to clinic today for at risk foot care with history of diabetic neuropathy and preulcerative lesion(s) b/l feet and painful mycotic toenails that limit ambulation. Painful toenails interfere with ambulation. Aggravating factors include wearing enclosed shoe gear. Pain is relieved with periodic professional debridement. Painful porokeratotic lesions are aggravated when weightbearing with and without shoegear. Pain is relieved with periodic professional debridement.  Chief Complaint  Patient presents with   Nail Problem    Diabetic foot care BS-[id number] A1C-6.5 PCP-Tammy Boyd PCP VST-Last week   New problem(s): None.   PCP is Bartholome Bill, MD.  Allergies  Allergen Reactions   Thayer Jew Hcl] Other (See Comments)    "extreme weakness, heaviness in legs & arms, swelling in legs/arms/face, swollen abdomen, pain in bladder, feet pain, soreness in chest"   Carbamazepine Other (See Comments)    Blood poisoning   Cholestyramine Itching, Nausea And Vomiting, Rash and Other (See Comments)    "itching rash on stomach, bloated, nausea, vomiting, sleeplessness, extreme pain in arms"  Patient is taking this in 2022.   Morphine Other (See Comments)    Feels morbid, weak, still in pain   Niacin Palpitations    Fast heart beat Other reaction(s): Unknown   Niaspan [Niacin Er] Palpitations and Other (See Comments)    "fast heart beat, high blood pressure"   Norvasc [Amlodipine Besylate] Other (See Comments)    "extreme fluid retention/pain)   Optivar [Azelastine Hcl] Photosensitivity   Repaglinide Hives   Sular [Nisoldipine Er] Other (See Comments)    "severe headaches, swelling eyes, hands, feet, shortness of breath, weak, flushed face, brain boiling, fluid retention, high blood sugar, nervous, heart fast beating"   Telmisartan Other (See Comments)    "headache, difficulty urinating,  high blood sugar, fluid retention"   Amoxicillin-Pot Clavulanate Rash    Tolerates Unasyn (>10 doses)   Cefdinir Swelling    Vaginal irritation, breathing,    Ciprofloxacin Hcl Hives   Clonidine Other (See Comments)    Dry mouth, fluid retention   Clonidine Hydrochloride Other (See Comments)    Dry mouth, fluid retention   Codeine Nausea And Vomiting   Ezetimibe Other (See Comments)    Made weak   Hydroxychloroquine Other (See Comments)    Low platlets   Naproxen Other (See Comments)    Shrinks bladder   Sulfa Antibiotics Rash   Ziac [Bisoprolol-Hydrochlorothiazide] Other (See Comments)    "stopped urination"   Amlodipine Besy-Benazepril Hcl Cough   Azelastine Other (See Comments)    Unknown reaction - Per MAR   Elavil [Amitriptyline] Other (See Comments)    Gave Pt nightmares   Empagliflozin Other (See Comments)    "Caused yeast infection, slowed my urine"   Hydralazine Other (See Comments)    "does not reduce high blood pressure, pain in arm, high pressure, felt like I was on verge of heart attack, really weak"   Iodine I 131 Tositumomab Other (See Comments)    Unknown reaction - Per MAR   Kenalog [Triamcinolone] Diarrhea    Per MAR   Keppra [Levetiracetam] Other (See Comments)    Shaking   Lamotrigine Itching and Other (See Comments)   Misc. Sulfonamide Containing Compounds    Pregabalin Swelling and Other (See Comments)    Weight gain   Pseudoephedrine Other (See Comments)    Unknown reaction   Pseudoephedrine-Guaifenesin Er Other (See Comments)  Unknown reaction - Per MAR   Risedronate     Unknown reaction - Per MAR   Ru-Hist D [Brompheniramine-Phenylephrine] Other (See Comments)    Unknown reaction - Per MAR   Sumatriptan Other (See Comments)   Topiramate Other (See Comments)    Dry eyes   Ace Inhibitors Other (See Comments)    unknown   Actonel [Risedronate Sodium] Other (See Comments)    unknown   Aspirin Other (See Comments)    Unknown reaction    Atacand [Candesartan] Other (See Comments)    Unknown reaction - MAR   Bextra [Valdecoxib] Other (See Comments)    Unknown reaction - Per MAR   Bisoprolol-Hydrochlorothiazide Other (See Comments)    Unknown reaction - Per MAR   Cefadroxil Other (See Comments)    Unknown reaction   Celecoxib Rash   Hydrocodone Other (See Comments)    unknown   Hydrocodone-Acetaminophen Other (See Comments)    unknown   Iodinated Contrast Media Rash and Other (See Comments)    "All over" rash   Meloxicam Other (See Comments)    unknown   Methylprednisolone Sodium Succinate Other (See Comments)    unknown   Nabumetone Other (See Comments)    Unknown reaction   Penicillins Other (See Comments)    unknown   Pseudoephedrine-Guaifenesin Other (See Comments)    unknown   Risedronate Sodium Other (See Comments)    unknown   Rofecoxib Other (See Comments)    Unknown reaction - MAR Other reaction(s): Unknown   Ru-Tuss [Chlorphen-Pse-Atrop-Hyos-Scop] Other (See Comments)    unknown   Sulfonamide Derivatives Other (See Comments)    unknown   Sulphur [Elemental Sulfur] Other (See Comments)    unknown   Telithromycin Other (See Comments)    unknown   Terfenadine Other (See Comments)    Unknown reaction - Per MAR   Trandolapril-Verapamil Hcl Er Other (See Comments)    Headache, difficulty urinating, high blood sugar, fluid retention  Pt is taking Tarka (trandolapril-verapamil) currently, but requests the medication stay in her allergy list   Trandolapril-Verapamil Hcl Er Other (See Comments)    Headache, difficulty urinating, high blood sugar, fluid retention  Pt is taking Tarka (trandolapril-verapamil) currently, but requests the medication stay in her allergy list   Valium [Diazepam] Other (See Comments)    Makes her mean and hyper    Review of Systems: Negative except as noted in the HPI.  Objective: No changes noted in today's physical examination.  [name] is a pleasant 88 y.o. female  WD, WN in NAD. AAO x 3.  Vascular Examination: CFT immediate b/l LE. Faintly palpable DP/PT pulses b/l LE. Digital hair absent b/l. Skin temperature gradient WNL b/l. No pain with calf compression b/l. No edema noted b/l. No cyanosis or clubbing noted b/l LE.  Dermatological Examination: No open wounds b/l LE. No interdigital macerations noted b/l LE. Toenails 1-5 bilaterally elongated, discolored, dystrophic, thickened, and crumbly with subungual debris and tenderness to dorsal palpation. Preulcerative lesion noted submet head 1 b/l. There is visible subdermal hemorrhage. There is no surrounding erythema, no edema, no drainage, no odor, no fluctuance.  Neurological Examination: Protective sensation diminished with 10g monofilament b/l. Vibratory sensation intact b/l.  Musculoskeletal Examination: Muscle strength 5/5 to all LE muscle groups of b/l lower extremities. Hammertoe deformity noted 1-5 b/l. Wearing diabetic shoes today. Utilizes rollator for ambulation assistance.  Assessment/Plan: 1. Pain due to onychomycosis of toenails of both feet   2. Pre-ulcerative calluses   3.  Diabetic peripheral neuropathy associated with type 2 diabetes mellitus (HCC)     No orders of the defined types were placed in this encounter.   -Consent given for treatment as described below: -Examined patient. -Continue diabetic shoes daily. -Toenails 1-5 b/l were debrided in length and girth with sterile nail nippers and dremel without iatrogenic bleeding.  -Preulcerative lesion pared submet head 1 b/l utilizing sterile scalpel blade. Total number pared=2. -Patient/POA to call should there be question/concern in the interim.   Return in about 9 weeks (around 06/02/2022).  Marzetta Board, DPM

## 2022-04-20 DIAGNOSIS — R2242 Localized swelling, mass and lump, left lower limb: Secondary | ICD-10-CM | POA: Diagnosis not present

## 2022-04-20 DIAGNOSIS — M5030 Other cervical disc degeneration, unspecified cervical region: Secondary | ICD-10-CM | POA: Diagnosis not present

## 2022-04-21 ENCOUNTER — Other Ambulatory Visit (HOSPITAL_COMMUNITY): Payer: Self-pay | Admitting: Physical Medicine and Rehabilitation

## 2022-04-21 ENCOUNTER — Ambulatory Visit (HOSPITAL_COMMUNITY)
Admission: RE | Admit: 2022-04-21 | Discharge: 2022-04-21 | Disposition: A | Discharge status: Home / Self Care | Payer: Medicare Other | Source: Ambulatory Visit | Attending: Cardiology | Admitting: Cardiology

## 2022-04-21 ENCOUNTER — Ambulatory Visit (HOSPITAL_COMMUNITY): Admission: RE | Admit: 2022-04-21 | Payer: Medicare Other | Source: Ambulatory Visit

## 2022-04-21 ENCOUNTER — Encounter (HOSPITAL_COMMUNITY): Payer: Self-pay

## 2022-04-21 DIAGNOSIS — M7989 Other specified soft tissue disorders: Secondary | ICD-10-CM | POA: Diagnosis not present

## 2022-04-22 ENCOUNTER — Ambulatory Visit (HOSPITAL_COMMUNITY): Payer: Medicare Other

## 2022-04-29 ENCOUNTER — Encounter (HOSPITAL_COMMUNITY): Payer: Medicare Other

## 2022-04-30 ENCOUNTER — Other Ambulatory Visit: Payer: Self-pay | Admitting: Neurology

## 2022-05-05 ENCOUNTER — Other Ambulatory Visit: Payer: Self-pay | Admitting: Family Medicine

## 2022-05-05 ENCOUNTER — Encounter (HOSPITAL_COMMUNITY): Payer: Medicare Other

## 2022-05-05 ENCOUNTER — Ambulatory Visit
Admission: RE | Admit: 2022-05-05 | Discharge: 2022-05-05 | Disposition: A | Discharge status: Home / Self Care | Payer: Medicare Other | Source: Ambulatory Visit | Attending: Family Medicine | Admitting: Family Medicine

## 2022-05-05 DIAGNOSIS — G8929 Other chronic pain: Secondary | ICD-10-CM

## 2022-05-11 ENCOUNTER — Emergency Department (HOSPITAL_COMMUNITY)
Admission: EM | Admit: 2022-05-11 | Discharge: 2022-05-11 | Disposition: A | Discharge status: Home / Self Care | Payer: Medicare Other | Attending: Emergency Medicine | Admitting: Emergency Medicine

## 2022-05-11 ENCOUNTER — Other Ambulatory Visit: Payer: Self-pay

## 2022-05-11 ENCOUNTER — Encounter (HOSPITAL_COMMUNITY): Payer: Self-pay

## 2022-05-11 DIAGNOSIS — Z794 Long term (current) use of insulin: Secondary | ICD-10-CM | POA: Diagnosis not present

## 2022-05-11 DIAGNOSIS — R1905 Periumbilic swelling, mass or lump: Secondary | ICD-10-CM | POA: Diagnosis present

## 2022-05-11 DIAGNOSIS — E119 Type 2 diabetes mellitus without complications: Secondary | ICD-10-CM | POA: Diagnosis not present

## 2022-05-11 DIAGNOSIS — Z85038 Personal history of other malignant neoplasm of large intestine: Secondary | ICD-10-CM | POA: Insufficient documentation

## 2022-05-11 DIAGNOSIS — K429 Umbilical hernia without obstruction or gangrene: Secondary | ICD-10-CM | POA: Insufficient documentation

## 2022-05-11 DIAGNOSIS — I10 Essential (primary) hypertension: Secondary | ICD-10-CM | POA: Diagnosis not present

## 2022-05-11 LAB — CBC WITH DIFFERENTIAL/PLATELET
Abs Immature Granulocytes: 0.03 10*3/uL (ref 0.00–0.07)
Basophils Absolute: 0 10*3/uL (ref 0.0–0.1)
Basophils Relative: 0 %
Eosinophils Absolute: 0.1 10*3/uL (ref 0.0–0.5)
Eosinophils Relative: 1 %
HCT: 36.9 % (ref 36.0–46.0)
Hemoglobin: 12.1 g/dL (ref 12.0–15.0)
Immature Granulocytes: 0 %
Lymphocytes Relative: 20 %
Lymphs Abs: 1.6 10*3/uL (ref 0.7–4.0)
MCH: 30.6 pg (ref 26.0–34.0)
MCHC: 32.8 g/dL (ref 30.0–36.0)
MCV: 93.2 fL (ref 80.0–100.0)
Monocytes Absolute: 0.5 10*3/uL (ref 0.1–1.0)
Monocytes Relative: 7 %
Neutro Abs: 5.6 10*3/uL (ref 1.7–7.7)
Neutrophils Relative %: 72 %
Platelets: 339 10*3/uL (ref 150–400)
RBC: 3.96 MIL/uL (ref 3.87–5.11)
RDW: 14.3 % (ref 11.5–15.5)
WBC: 7.9 10*3/uL (ref 4.0–10.5)
nRBC: 0 % (ref 0.0–0.2)

## 2022-05-11 LAB — COMPREHENSIVE METABOLIC PANEL
ALT: 19 U/L (ref 0–44)
AST: 23 U/L (ref 15–41)
Albumin: 3.4 g/dL — ABNORMAL LOW (ref 3.5–5.0)
Alkaline Phosphatase: 98 U/L (ref 38–126)
Anion gap: 7 (ref 5–15)
BUN: 13 mg/dL (ref 8–23)
CO2: 23 mmol/L (ref 22–32)
Calcium: 8.5 mg/dL — ABNORMAL LOW (ref 8.9–10.3)
Chloride: 98 mmol/L (ref 98–111)
Creatinine, Ser: 0.72 mg/dL (ref 0.44–1.00)
GFR, Estimated: >60 mL/min (ref >60)
Glucose, Bld: 155 mg/dL — ABNORMAL HIGH (ref 70–99)
Potassium: 4.6 mmol/L (ref 3.5–5.1)
Sodium: 128 mmol/L — ABNORMAL LOW (ref 135–145)
Total Bilirubin: 0.4 mg/dL (ref 0.3–1.2)
Total Protein: 6.2 g/dL — ABNORMAL LOW (ref 6.5–8.1)

## 2022-05-11 NOTE — ED Provider Triage Note (Signed)
Emergency Medicine Provider Triage Evaluation Note  [name] , a 89 y.o. female  was evaluated in triage.  Pt complains of 89 year old female presents today for evaluation of a reducible incisional hernia just above the umbilicus.  She states she noticed this around the time she fell.  Denies abdominal pain, nausea, or vomiting.  Denies dysuria or other complaints.  She states she was getting out of the bed to answer the door.  The alarm had worn off so she was trying to hurry.  She grabbed her walker however she fell and the walker fell on top of her.  Denies head injury.  Not on anticoagulation.  She crawled to the door and was able to answer the door.  They called 911.  She did not want to be evaluated at that time.  Complains generalized soreness otherwise no joint pain.  Able to move all major joints without difficulty and with good range of motion.  Her main concern is the incisional hernia.  On exam it is easily reducible.  Does not appear to be in any acute distress..  Review of Systems  Positive: As above Negative: As above  Physical Exam  BP (!) 147/83 (BP Location: Right Arm)   Pulse 74   Temp 97.8 F (36.6 C) (Oral)   Resp 20   Ht 5' 7.5" (1.715 m)   Wt 63 kg   SpO2 99%   BMI 21.45 kg/m  Gen:   Awake, no distress   Resp:  Normal effort  MSK:   Moves extremities without difficulty  Other:    Medical Decision Making  Medically screening exam initiated at 11:25 AM.  Appropriate orders placed.  [name] was informed that the remainder of the evaluation will be completed by another provider, this initial triage assessment does not replace that evaluation, and the importance of remaining in the ED until their evaluation is complete.     Evlyn Courier, PA-C 05/11/22 1127

## 2022-05-11 NOTE — ED Triage Notes (Signed)
Pt presents concern with a periumbilical mass that was noticed on 1/4. Pt states that day she was hurrying across the house and tripped. Pt reports soreness all over from the fall, but denies specific injury. Denies LOC.

## 2022-05-11 NOTE — ED Notes (Signed)
Pt fell towards the left side on 05/07/21.  Pt reports a mass appeared above her umbilicus.  Pt states that it does not hurt when palpated but it does feel "tight".

## 2022-05-11 NOTE — Discharge Instructions (Signed)
As stated with you in the room, if you have any significant/severe pain at your hernia site and hernia is not reducible and there are overlying skin changes or other concerns please return for evaluation.  Otherwise follow-up with general surgery team.

## 2022-05-11 NOTE — ED Provider Notes (Signed)
[facility] EMERGENCY DEPARTMENT Provider Note   CSN: [id number] Arrival date & time: 05/11/22  9371     History  Chief Complaint  Patient presents with   Mass    [name] is a 89 y.o. female.  Patient here with mass around her umbilicus.  History of diabetes, hypertension, colon cancer status postresection, prior hip fracture.  She states that she tripped and fell about 4 to 5 days ago.  Did not hit her head or lose consciousness.  But since then she noticed masslike structure at her umbilical area.  She denies any extremity pain, headache, neck pain, nausea, vomiting, diarrhea.  Nothing makes it worse or better.  She denies having hernia in the past or having a mass like this in the past.  The history is provided by the patient.       Home Medications Prior to Admission medications   Medication Sig Start Date End Date Taking? Authorizing Provider  acetaminophen (TYLENOL) 500 MG tablet Take 2 tablets (1,000 mg total) by mouth 3 (three) times daily. Patient taking differently: Take 1,000 mg by mouth every 8 (eight) hours as needed for moderate pain. 08/21/20   Geradine Girt, DO  ALPRAZolam Duanne Moron) 0.25 MG tablet Take 1 tablet (0.25 mg total) by mouth 2 (two) times daily as needed for anxiety. 01/29/22   Ghimire, Henreitta Leber, MD  Biotin 5000 MCG CAPS Take 5,000 mcg by mouth daily.    [provider]  Blood Glucose Monitoring Suppl (PRODIGY VOICE BLOOD GLUCOSE) w/Device KIT Use to check blood sugar 1 time per day. Patient taking differently: 1 each by Other route See admin instructions. Use to check blood sugar 1 time per day. 10/18/15   Renato Shin, MD  capsicum (ZOSTRIX) 0.075 % topical cream Apply 1 Application topically 4 (four) times daily as needed (pain). 08/04/21   [provider]  Cholecalciferol (VITAMIN D-3) 125 MCG (5000 UT) TABS Take 5,000 Units by mouth daily.    [provider]  cholestyramine (QUESTRAN) 4 g packet Take 4 g by  mouth 2 (two) times daily.    [provider]  cyanocobalamin (,VITAMIN B-12,) 1000 MCG/ML injection Inject 1,000 mcg into the muscle every 21 ( twenty-one) days.    [provider]  diclofenac Sodium (VOLTAREN) 1 % GEL Apply 2 g topically 4 (four) times daily as needed (for knee and back pain). 06/01/19   [provider]  diphenhydrAMINE (BENADRYL) 25 mg capsule Take 25 mg by mouth every 8 (eight) hours as needed for allergies or sleep.    [provider]  glucose blood (GLUCOSE METER TEST) test strip See admin instructions.    [provider]  Glucose Blood (PRODIGY VOICE BLOOD GLUCOSE VI)     [provider]  insulin aspart (NOVOLOG) 100 UNIT/ML FlexPen Inject 2-12 Units into the skin See admin instructions. Injects 2-10 units of insulin under the skin 3 times a day per sliding scale: CBG 0-150: 2 units; CBG 151-200: 4 units; CBG 201-250: 6 units; CBG 251-300: 8 units; CBG 301-350: 10 units; CBG 351-400: 12 units; CBG >400: 12 units and notify the MD/NP Patient taking differently: Inject 2-12 Units into the skin See admin instructions. Inject 2-10 units into the skin 3 times a day per sliding scale: CBG 0-150: 2 units; CBG 151-200: 4 units; CBG 201-250: 6 units; CBG 251-300: 8 units; CBG 301-350: 10 units; CBG 351-400: 12 units; CBG >400: 12 units and notify the MD/NP 07/08/20  Fargo, Amy E, NP  insulin glargine (LANTUS SOLOSTAR) 100 UNIT/ML Solostar Pen Inject 10 Units into the skin at bedtime. Patient taking differently: Inject 10 Units into the skin See admin instructions. Inject 10 units into the skin every afternoon- when eating 08/21/20   Eulogio Bear U, DO  Insulin Pen Needle 32G X 4 MM MISC Used to inject insulin 3x daily Patient taking differently: 1 each by Other route See admin instructions. Used to inject insulin 3x daily 11/19/16   Renato Shin, MD  levothyroxine (SYNTHROID) 75 MCG tablet Take 1 tablet (75 mcg total) by mouth daily  before breakfast. 07/08/20   Fargo, Amy E, NP  pantoprazole (PROTONIX) 40 MG tablet Take 40 mg by mouth daily. 09/22/21   [provider]  PHENYTEK 300 MG ER capsule TAKE 1 CAPSULE AT BEDTIME 04/30/22   Alric Ran, MD  phenytoin (DILANTIN) 50 MG tablet Chew 1 tablet (50 mg total) by mouth daily. 01/13/22   Alric Ran, MD  Polyethyl Glycol-Propyl Glycol (SYSTANE) 0.4-0.3 % SOLN Place 2 drops into both eyes 3 (three) times daily.    [provider]  polyethylene glycol (MIRALAX / GLYCOLAX) 17 g packet Take 17 g by mouth daily as needed for mild constipation.    [provider]  primidone (MYSOLINE) 250 MG tablet Take 250 mg by mouth at bedtime. 09/30/21   [provider]  primidone (MYSOLINE) 50 MG tablet Take 1 tablet (50 mg total) by mouth at bedtime. 02/16/22 02/11/23  Alric Ran, MD  thiamine 250 MG tablet Take 250 mg by mouth daily.    [provider]  traMADol (ULTRAM) 50 MG tablet Take 1 tablet (50 mg total) by mouth every 8 (eight) hours as needed for moderate pain. 01/29/22   Ghimire, Henreitta Leber, MD  Verapamil HCl CR 200 MG CP24 Take 200 mg by mouth daily. 10/19/20   [provider]      Allergies    Bystolic [nebivolol hcl], Carbamazepine, Cholestyramine, Morphine, Niacin, Niaspan [niacin er], Norvasc [amlodipine besylate], Optivar [azelastine hcl], Repaglinide, Sular [nisoldipine er], Telmisartan, Amoxicillin-pot clavulanate, Cefdinir, Ciprofloxacin hcl, Clonidine, Clonidine hydrochloride, Codeine, Ezetimibe, Hydroxychloroquine, Naproxen, Sulfa antibiotics, Ziac [bisoprolol-hydrochlorothiazide], Amlodipine besy-benazepril hcl, Azelastine, Elavil [amitriptyline], Empagliflozin, Hydralazine, Iodine i 131 tositumomab, Kenalog [triamcinolone], Keppra [levetiracetam], Lamotrigine, Misc. sulfonamide containing compounds, Pregabalin, Pseudoephedrine, Pseudoephedrine-guaifenesin er, Risedronate, Ru-hist d [brompheniramine-phenylephrine],  Sumatriptan, Topiramate, Ace inhibitors, Actonel [risedronate sodium], Aspirin, Atacand [candesartan], Bextra [valdecoxib], Bisoprolol-hydrochlorothiazide, Cefadroxil, Celecoxib, Hydrocodone, Hydrocodone-acetaminophen, Iodinated contrast media, Meloxicam, Methylprednisolone sodium succinate, Nabumetone, Penicillins, Pseudoephedrine-guaifenesin, Risedronate sodium, Rofecoxib, Ru-tuss [chlorphen-pse-atrop-hyos-scop], Sulfonamide derivatives, Sulphur [elemental sulfur], Telithromycin, Terfenadine, Trandolapril-verapamil hcl er, Trandolapril-verapamil hcl er, and Valium [diazepam]    Review of Systems   Review of Systems  Physical Exam Updated Vital Signs BP 132/61   Pulse 76   Temp (!) 97.4 F (36.3 C) (Oral)   Resp 17   Ht 5' 7.5" (1.715 m)   Wt 63 kg   SpO2 97%   BMI 21.45 kg/m  Physical Exam Vitals and nursing note reviewed.  Constitutional:      General: She is not in acute distress.    Appearance: She is well-developed. She is not ill-appearing.  HENT:     Head: Normocephalic and atraumatic.     Nose: Nose normal.     Mouth/Throat:     Mouth: Mucous membranes are moist.  Eyes:     Extraocular Movements: Extraocular movements intact.     Conjunctiva/sclera: Conjunctivae normal.     Pupils: Pupils are equal, round, and reactive  to light.  Cardiovascular:     Rate and Rhythm: Normal rate and regular rhythm.     Pulses: Normal pulses.     Heart sounds: Normal heart sounds. No murmur heard. Pulmonary:     Effort: Pulmonary effort is normal. No respiratory distress.     Breath sounds: Normal breath sounds.  Abdominal:     Palpations: Abdomen is soft.     Tenderness: There is no abdominal tenderness.     Comments: Patient has easily reducible umbilical hernia with no overlying skin changes  Musculoskeletal:        General: No swelling.     Cervical back: Normal range of motion and neck supple.  Skin:    General: Skin is warm and dry.     Capillary Refill: Capillary refill  takes less than 2 seconds.  Neurological:     General: No focal deficit present.     Mental Status: She is alert and oriented to person, place, and time.     Cranial Nerves: No cranial nerve deficit.     Sensory: No sensory deficit.     Motor: No weakness.     Coordination: Coordination normal.  Psychiatric:        Mood and Affect: Mood normal.     ED Results / Procedures / Treatments   Labs (all labs ordered are listed, but only abnormal results are displayed) Labs Reviewed  COMPREHENSIVE METABOLIC PANEL - Abnormal; Notable for the following components:      Result Value   Sodium 128 (*)    Glucose, Bld 155 (*)    Calcium 8.5 (*)    Total Protein 6.2 (*)    Albumin 3.4 (*)    All other components within normal limits  CBC WITH DIFFERENTIAL/PLATELET    EKG None  Radiology No results found.  Procedures Procedures    Medications Ordered in ED Medications - No data to display  ED Course/ Medical Decision Making/ A&P                           Medical Decision Making  Tylie Golonka Jillson is here with mass around her bellybutton.  Patient with history of high blood pressure, abdominal surgery in the past.  She has reducible umbilical hernia on exam.  She not having any obstructive symptoms.  There is no overlying skin changes.  I can palpate a fairly sizable defect in this area which I suspect is from an old surgical site.  She has had multiple abdominal surgeries in the past.  I suspect that this is a umbilical hernia from prior surgery site.  Right now there are no complications and easily reducible.  Does come back out however.  I educated her about avoiding heavy lifting and MiraLAX use.  She has followed with [facility] in the past for her colon surgery for her cancer.  Will have her follow-up with them as she does state that she want to pursue possible surgery for this.  She understands return precautions.  Discharged in good condition.  This chart was dictated using  voice recognition software.  Despite best efforts to proofread,  errors can occur which can change the documentation meaning.         Final Clinical Impression(s) / ED Diagnoses Final diagnoses:  Umbilical hernia without obstruction and without gangrene    Rx / DC Orders ED Discharge Orders     None  Lennice Sites, DO 05/11/22 1353

## 2022-05-22 ENCOUNTER — Telehealth: Payer: Self-pay

## 2022-05-22 NOTE — Telephone Encounter (Signed)
        Patient  visited [facility] on 11/06/1468  for Umbilical hernia without obstruction and without gangrene.   Telephone encounter attempt :  1st  A HIPAA compliant voice message was left requesting a return call.  Instructed patient to call back at [phone].   [place] Resource Care Guide   ??[email or web address]  ?? [phone]   Website: [email or web address]  [email or web address]

## 2022-05-26 ENCOUNTER — Telehealth: Payer: Self-pay

## 2022-05-26 NOTE — Telephone Encounter (Signed)
     Patient  visit on 05/11/2022  at [facility] was for Umbilical hernia without obstruction and without gangrene.  Have you been able to follow up with your primary care physician? Yes  The patient was or was not able to obtain any needed medicine or equipment. No medication prescribed.  Are there diet recommendations that you are having difficulty following? No  Patient expresses understanding of discharge instructions and education provided has no other needs at this time.    [place] Resource Care Guide   [email or web address]  ?? [phone]   Website: [email or web address]  [email or web address]

## 2022-06-02 ENCOUNTER — Ambulatory Visit: Payer: Medicare Other | Admitting: Neurology

## 2022-06-02 ENCOUNTER — Telehealth: Payer: Self-pay | Admitting: Neurology

## 2022-06-11 ENCOUNTER — Encounter (HOSPITAL_COMMUNITY): Payer: Self-pay

## 2022-06-11 ENCOUNTER — Emergency Department (HOSPITAL_COMMUNITY): Payer: Medicare Other

## 2022-06-11 ENCOUNTER — Emergency Department (HOSPITAL_COMMUNITY)
Admission: EM | Admit: 2022-06-11 | Discharge: 2022-06-11 | Disposition: A | Discharge status: Home / Self Care | Payer: Medicare Other | Attending: Emergency Medicine | Admitting: Emergency Medicine

## 2022-06-11 ENCOUNTER — Other Ambulatory Visit: Payer: Self-pay

## 2022-06-11 DIAGNOSIS — M79642 Pain in left hand: Secondary | ICD-10-CM | POA: Diagnosis not present

## 2022-06-11 DIAGNOSIS — Z794 Long term (current) use of insulin: Secondary | ICD-10-CM | POA: Diagnosis not present

## 2022-06-11 DIAGNOSIS — R519 Headache, unspecified: Secondary | ICD-10-CM | POA: Diagnosis not present

## 2022-06-11 DIAGNOSIS — W19XXXA Unspecified fall, initial encounter: Secondary | ICD-10-CM | POA: Diagnosis not present

## 2022-06-11 DIAGNOSIS — Z85038 Personal history of other malignant neoplasm of large intestine: Secondary | ICD-10-CM | POA: Diagnosis not present

## 2022-06-11 DIAGNOSIS — E119 Type 2 diabetes mellitus without complications: Secondary | ICD-10-CM | POA: Insufficient documentation

## 2022-06-11 DIAGNOSIS — I10 Essential (primary) hypertension: Secondary | ICD-10-CM | POA: Insufficient documentation

## 2022-06-11 DIAGNOSIS — D649 Anemia, unspecified: Secondary | ICD-10-CM | POA: Diagnosis not present

## 2022-06-11 DIAGNOSIS — Z853 Personal history of malignant neoplasm of breast: Secondary | ICD-10-CM | POA: Diagnosis not present

## 2022-06-11 DIAGNOSIS — Y92009 Unspecified place in unspecified non-institutional (private) residence as the place of occurrence of the external cause: Secondary | ICD-10-CM | POA: Diagnosis not present

## 2022-06-11 DIAGNOSIS — E871 Hypo-osmolality and hyponatremia: Secondary | ICD-10-CM | POA: Insufficient documentation

## 2022-06-11 DIAGNOSIS — M79641 Pain in right hand: Secondary | ICD-10-CM | POA: Diagnosis present

## 2022-06-11 LAB — COMPREHENSIVE METABOLIC PANEL
ALT: 18 U/L (ref 0–44)
AST: 19 U/L (ref 15–41)
Albumin: 3.6 g/dL (ref 3.5–5.0)
Alkaline Phosphatase: 86 U/L (ref 38–126)
Anion gap: 10 (ref 5–15)
BUN: 12 mg/dL (ref 8–23)
CO2: 24 mmol/L (ref 22–32)
Calcium: 8.6 mg/dL — ABNORMAL LOW (ref 8.9–10.3)
Chloride: 97 mmol/L — ABNORMAL LOW (ref 98–111)
Creatinine, Ser: 0.62 mg/dL (ref 0.44–1.00)
GFR, Estimated: >60 mL/min (ref >60)
Glucose, Bld: 109 mg/dL — ABNORMAL HIGH (ref 70–99)
Potassium: 4.1 mmol/L (ref 3.5–5.1)
Sodium: 131 mmol/L — ABNORMAL LOW (ref 135–145)
Total Bilirubin: 0.5 mg/dL (ref 0.3–1.2)
Total Protein: 6.2 g/dL — ABNORMAL LOW (ref 6.5–8.1)

## 2022-06-11 LAB — CBC WITH DIFFERENTIAL/PLATELET
Abs Immature Granulocytes: 0.03 10*3/uL (ref 0.00–0.07)
Basophils Absolute: 0 10*3/uL (ref 0.0–0.1)
Basophils Relative: 0 %
Eosinophils Absolute: 0.1 10*3/uL (ref 0.0–0.5)
Eosinophils Relative: 1 %
HCT: 35.2 % — ABNORMAL LOW (ref 36.0–46.0)
Hemoglobin: 11.6 g/dL — ABNORMAL LOW (ref 12.0–15.0)
Immature Granulocytes: 0 %
Lymphocytes Relative: 13 %
Lymphs Abs: 1.3 10*3/uL (ref 0.7–4.0)
MCH: 30.6 pg (ref 26.0–34.0)
MCHC: 33 g/dL (ref 30.0–36.0)
MCV: 92.9 fL (ref 80.0–100.0)
Monocytes Absolute: 0.8 10*3/uL (ref 0.1–1.0)
Monocytes Relative: 8 %
Neutro Abs: 8.2 10*3/uL — ABNORMAL HIGH (ref 1.7–7.7)
Neutrophils Relative %: 78 %
Platelets: 334 10*3/uL (ref 150–400)
RBC: 3.79 MIL/uL — ABNORMAL LOW (ref 3.87–5.11)
RDW: 14.5 % (ref 11.5–15.5)
WBC: 10.4 10*3/uL (ref 4.0–10.5)
nRBC: 0 % (ref 0.0–0.2)

## 2022-06-11 LAB — CBG MONITORING, ED: Glucose-Capillary: 100 mg/dL — ABNORMAL HIGH (ref 70–99)

## 2022-06-11 LAB — CK: Total CK: 30 U/L — ABNORMAL LOW (ref 38–234)

## 2022-06-11 MED ORDER — ACETAMINOPHEN 500 MG PO TABS
1000.0000 mg | ORAL_TABLET | Freq: Once | ORAL | Status: AC
  Administered 2022-06-11: 1000 mg via ORAL
  Filled 2022-06-11: qty 2

## 2022-06-11 MED ORDER — LACTATED RINGERS IV BOLUS
500.0000 mL | Freq: Once | INTRAVENOUS | Status: AC
  Administered 2022-06-11: 500 mL via INTRAVENOUS

## 2022-06-11 MED ORDER — OXYCODONE-ACETAMINOPHEN 5-325 MG PO TABS
1.0000 | ORAL_TABLET | Freq: Once | ORAL | Status: AC
  Administered 2022-06-11: 1 via ORAL
  Filled 2022-06-11: qty 1

## 2022-06-11 NOTE — ED Notes (Signed)
Pt. In X-ray. ?

## 2022-06-11 NOTE — ED Notes (Signed)
Pt. CBG 100, RN made aware.

## 2022-06-11 NOTE — Discharge Instructions (Signed)
You are seen in the ER after your fall.  The your physical exam and imaging was reassuring.  You have a small broken bone in your left middle finger but aside from that there are no broken bones, bleeding in your brain, or other evidence of injury on your x-rays or CT scans.  Your laboratory studies are reassuring as well.  Follow-up with your primary care doctor, use Tylenol as needed as well as your previously prescribed tramadol, however please do not exceed 1 every 6-8 hours as prescribed.  Return to the ER with any new severe symptoms.

## 2022-06-11 NOTE — ED Triage Notes (Signed)
Pt BIB EMS from home for a fall around 2030 last night. Pt fell backwards and hit her head. Pt left middle finger is swollen and pt c/o pain on the right hand. Pt took 3-4 tramadol w/ no relief  142/86 72 hr 99% O2

## 2022-06-11 NOTE — ED Provider Notes (Signed)
[facility] Provider Note   CSN: [id number] Arrival date & time: 06/11/22  0348     History  Chief Complaint  Patient presents with   Fall   Finger Injury    [name] is a 89 y.o. female who presents via EMS after mechanical fall at home with downtime of 7 hours.  Patient states that she was going to check her thermostat, lost her balance walking the carpeted hallway and fell backwards striking her head on the carpeted ground.  No LOC noted, no vomiting, blurry or double vision since that time.  States that it took her quite some time to crawl around the corner into her bedroom where she was able to pull the phone off of her arm more and call for EMS.  In the interim between EMS arrival she took 4 tramadol, usually will take 1.  No relief in her left hand pain which is her primary complaint at this time.  Has had a total of 200 mg of tramadol.  Also states she took her other nighttime medications prior to her fall which includes primidone, insulin, patient not on any anticoagulation.  I personally reviewed her medical records which is history of GERD, seizures, remote history of breast cancer, history of colon cancer, diabetes, hyperlipidemia, removal of intracranial tumor in 2015.  HPI     Home Medications Prior to Admission medications   Medication Sig Start Date End Date Taking? Authorizing Provider  acetaminophen (TYLENOL) 500 MG tablet Take 2 tablets (1,000 mg total) by mouth 3 (three) times daily. Patient taking differently: Take 1,000 mg by mouth every 8 (eight) hours as needed for moderate pain. 08/21/20   Geradine Girt, DO  ALPRAZolam Duanne Moron) 0.25 MG tablet Take 1 tablet (0.25 mg total) by mouth 2 (two) times daily as needed for anxiety. 01/29/22   Ghimire, Henreitta Leber, MD  Biotin 5000 MCG CAPS Take 5,000 mcg by mouth daily.    [provider]  Blood Glucose Monitoring Suppl (PRODIGY VOICE BLOOD GLUCOSE) w/Device KIT Use to  check blood sugar 1 time per day. Patient taking differently: 1 each by Other route See admin instructions. Use to check blood sugar 1 time per day. 10/18/15   Renato Shin, MD  capsicum (ZOSTRIX) 0.075 % topical cream Apply 1 Application topically 4 (four) times daily as needed (pain). 08/04/21   [provider]  Cholecalciferol (VITAMIN D-3) 125 MCG (5000 UT) TABS Take 5,000 Units by mouth daily.    [provider]  cholestyramine (QUESTRAN) 4 g packet Take 4 g by mouth 2 (two) times daily.    [provider]  cyanocobalamin (,VITAMIN B-12,) 1000 MCG/ML injection Inject 1,000 mcg into the muscle every 21 ( twenty-one) days.    [provider]  diclofenac Sodium (VOLTAREN) 1 % GEL Apply 2 g topically 4 (four) times daily as needed (for knee and back pain). 06/01/19   [provider]  diphenhydrAMINE (BENADRYL) 25 mg capsule Take 25 mg by mouth every 8 (eight) hours as needed for allergies or sleep.    [provider]  glucose blood (GLUCOSE METER TEST) test strip See admin instructions.    [provider]  Glucose Blood (PRODIGY VOICE BLOOD GLUCOSE VI)     [provider]  insulin aspart (NOVOLOG) 100 UNIT/ML FlexPen Inject 2-12 Units into the skin See admin instructions. Injects 2-10 units of insulin under the skin 3 times a day per sliding scale: CBG  0-150: 2 units; CBG 151-200: 4 units; CBG 201-250: 6 units; CBG 251-300: 8 units; CBG 301-350: 10 units; CBG 351-400: 12 units; CBG >400: 12 units and notify the MD/NP Patient taking differently: Inject 2-12 Units into the skin See admin instructions. Inject 2-10 units into the skin 3 times a day per sliding scale: CBG 0-150: 2 units; CBG 151-200: 4 units; CBG 201-250: 6 units; CBG 251-300: 8 units; CBG 301-350: 10 units; CBG 351-400: 12 units; CBG >400: 12 units and notify the MD/NP 07/08/20   Cleophas Dunker, Amy E, NP  insulin glargine (LANTUS SOLOSTAR) 100 UNIT/ML Solostar Pen Inject 10 Units  into the skin at bedtime. Patient taking differently: Inject 10 Units into the skin See admin instructions. Inject 10 units into the skin every afternoon- when eating 08/21/20   Eulogio Bear U, DO  Insulin Pen Needle 32G X 4 MM MISC Used to inject insulin 3x daily Patient taking differently: 1 each by Other route See admin instructions. Used to inject insulin 3x daily 11/19/16   Renato Shin, MD  levothyroxine (SYNTHROID) 75 MCG tablet Take 1 tablet (75 mcg total) by mouth daily before breakfast. 07/08/20   Fargo, Amy E, NP  pantoprazole (PROTONIX) 40 MG tablet Take 40 mg by mouth daily. 09/22/21   [provider]  PHENYTEK 300 MG ER capsule TAKE 1 CAPSULE AT BEDTIME 04/30/22   Alric Ran, MD  phenytoin (DILANTIN) 50 MG tablet Chew 1 tablet (50 mg total) by mouth daily. 01/13/22   Alric Ran, MD  Polyethyl Glycol-Propyl Glycol (SYSTANE) 0.4-0.3 % SOLN Place 2 drops into both eyes 3 (three) times daily.    [provider]  polyethylene glycol (MIRALAX / GLYCOLAX) 17 g packet Take 17 g by mouth daily as needed for mild constipation.    [provider]  primidone (MYSOLINE) 250 MG tablet Take 250 mg by mouth at bedtime. 09/30/21   [provider]  primidone (MYSOLINE) 50 MG tablet Take 1 tablet (50 mg total) by mouth at bedtime. 02/16/22 02/11/23  Alric Ran, MD  thiamine 250 MG tablet Take 250 mg by mouth daily.    [provider]  traMADol (ULTRAM) 50 MG tablet Take 1 tablet (50 mg total) by mouth every 8 (eight) hours as needed for moderate pain. 01/29/22   Ghimire, Henreitta Leber, MD  Verapamil HCl CR 200 MG CP24 Take 200 mg by mouth daily. 10/19/20   [provider]      Allergies    Bystolic [nebivolol hcl], Carbamazepine, Cholestyramine, Morphine, Niacin, Niaspan [niacin er], Norvasc [amlodipine besylate], Optivar [azelastine hcl], Repaglinide, Sular [nisoldipine er], Telmisartan, Amoxicillin-pot clavulanate, Cefdinir, Ciprofloxacin hcl,  Clonidine, Clonidine hydrochloride, Codeine, Ezetimibe, Hydroxychloroquine, Naproxen, Sulfa antibiotics, Ziac [bisoprolol-hydrochlorothiazide], Amlodipine besy-benazepril hcl, Azelastine, Elavil [amitriptyline], Empagliflozin, Hydralazine, Iodine i 131 tositumomab, Kenalog [triamcinolone], Keppra [levetiracetam], Lamotrigine, Misc. sulfonamide containing compounds, Pregabalin, Pseudoephedrine, Pseudoephedrine-guaifenesin er, Risedronate, Ru-hist d [brompheniramine-phenylephrine], Sumatriptan, Topiramate, Ace inhibitors, Actonel [risedronate sodium], Aspirin, Atacand [candesartan], Bextra [valdecoxib], Bisoprolol-hydrochlorothiazide, Cefadroxil, Celecoxib, Hydrocodone, Hydrocodone-acetaminophen, Iodinated contrast media, Meloxicam, Methylprednisolone sodium succinate, Nabumetone, Penicillins, Pseudoephedrine-guaifenesin, Risedronate sodium, Rofecoxib, Ru-tuss [chlorphen-pse-atrop-hyos-scop], Sulfonamide derivatives, Sulphur [elemental sulfur], Telithromycin, Terfenadine, Trandolapril-verapamil hcl er, Trandolapril-verapamil hcl er, and Valium [diazepam]    Review of Systems   Review of Systems  Musculoskeletal:        Left hand pain    Physical Exam Updated Vital Signs BP 130/87   Pulse 64   Temp 97.9 F (36.6 C)   Resp 18   SpO2 100%  Physical Exam Vitals and nursing note  reviewed.  Constitutional:      Appearance: She is not ill-appearing or toxic-appearing.  HENT:     Head: Normocephalic and atraumatic.     Nose: Nose normal.     Mouth/Throat:     Mouth: Mucous membranes are moist.     Pharynx: Oropharynx is clear. Uvula midline. No oropharyngeal exudate or posterior oropharyngeal erythema.     Tonsils: No tonsillar exudate.  Eyes:     General: Lids are normal. Vision grossly intact.        Right eye: No discharge.        Left eye: No discharge.     Extraocular Movements: Extraocular movements intact.     Conjunctiva/sclera: Conjunctivae normal.     Pupils: Pupils are equal, round,  and reactive to light.  Neck:     Trachea: Trachea and phonation normal.  Cardiovascular:     Rate and Rhythm: Normal rate and regular rhythm.     Pulses: Normal pulses.          Radial pulses are 2+ on the right side and 2+ on the left side.     Heart sounds: Normal heart sounds.  Pulmonary:     Effort: Pulmonary effort is normal. No tachypnea, bradypnea, accessory muscle usage, prolonged expiration or respiratory distress.     Breath sounds: Normal breath sounds. No wheezing or rales.  Chest:     Chest wall: No mass, lacerations, deformity, swelling, tenderness, crepitus or edema.  Abdominal:     General: Bowel sounds are normal. There is no distension.     Palpations: Abdomen is soft.     Tenderness: There is no abdominal tenderness. There is no right CVA tenderness, left CVA tenderness, guarding or rebound.  Musculoskeletal:     Right shoulder: Normal.     Left shoulder: Normal.     Right upper arm: Normal.     Left upper arm: Normal.     Right elbow: Normal.     Left elbow: Normal.     Right forearm: Normal.     Left forearm: Normal.     Right wrist: Normal.     Left wrist: Normal.     Right hand: Tenderness present. No deformity or bony tenderness. Normal range of motion. There is no disruption of two-point discrimination. Normal capillary refill. Normal pulse.     Left hand: Deformity, tenderness and bony tenderness present. Normal range of motion. Normal capillary refill. Normal pulse.       Hands:     Cervical back: Normal range of motion and neck supple.     Right lower leg: No edema.     Left lower leg: No edema.  Lymphadenopathy:     Cervical: No cervical adenopathy.  Skin:    General: Skin is warm and dry.     Capillary Refill: Capillary refill takes less than 2 seconds.  Neurological:     General: No focal deficit present.     Mental Status: She is alert and oriented to person, place, and time. Mental status is at baseline.     GCS: GCS eye subscore is 4. GCS  verbal subscore is 5. GCS motor subscore is 6.     Sensory: Sensation is intact.     Motor: Motor function is intact.  Psychiatric:        Mood and Affect: Mood normal.     ED Results / Procedures / Treatments   Labs (all labs ordered are listed, but only abnormal results are  displayed) Labs Reviewed  CBC WITH DIFFERENTIAL/PLATELET - Abnormal; Notable for the following components:      Result Value   RBC 3.79 (*)    Hemoglobin 11.6 (*)    HCT 35.2 (*)    Neutro Abs 8.2 (*)    All other components within normal limits  COMPREHENSIVE METABOLIC PANEL - Abnormal; Notable for the following components:   Sodium 131 (*)    Chloride 97 (*)    Glucose, Bld 109 (*)    Calcium 8.6 (*)    Total Protein 6.2 (*)    All other components within normal limits  CK - Abnormal; Notable for the following components:   Total CK 30 (*)    All other components within normal limits    EKG None  Radiology DG Elbow Complete Left  Result Date: 06/11/2022 CLINICAL DATA:  Fall with left elbow pain. EXAM: LEFT ELBOW - COMPLETE 3+ VIEW COMPARISON:  None Available. FINDINGS: There is no evidence of fracture, dislocation, or joint effusion. There is no evidence of arthropathy or other focal bone abnormality. There is generalized osteopenia. There is mild dorsal soft tissue fullness. IMPRESSION: Osteopenia and mild dorsal soft tissue fullness. No evidence of acute fractures or dislocations. No significant joint effusion. Electronically Signed   By: Telford Nab M.D.   On: 06/11/2022 06:27   DG Hand Complete Left  Result Date: 06/11/2022 CLINICAL DATA:  89 year old female with history of trauma from a fall complaining of pain in the left middle finger. EXAM: LEFT HAND - COMPLETE 3+ VIEW COMPARISON:  No priors. FINDINGS: Three views of the left hand demonstrate soft tissue swelling around the left middle finger, most severe adjacent to the PIP joint. The oblique projection suggests a small nondisplaced fracture  through the medial aspect of the proximal phalanx adjacent to the joint space, predominantly in an area of osteophytosis. No other potential acute displaced fracture is identified. Extensive joint space narrowing, subchondral sclerosis, subchondral cyst formation and osteophyte formation is noted throughout the hand and wrist, most severe in the PIP and DIP joints, compatible with osteoarthritis. Some medial subluxation is noted at the second and third PIP joints (likely chronic). IMPRESSION: 1. Advanced degenerative changes of osteoarthritis in the left hand with probable nondisplaced fracture through an osteophyte of the third proximal phalanx adjacent to the PIP joint, as above. Electronically Signed   By: Vinnie Langton M.D.   On: 06/11/2022 06:26   DG Pelvis 1-2 Views  Result Date: 06/11/2022 CLINICAL DATA:  89 year old female status post fall backwards at 2030 hours last night. Continued pain. EXAM: PELVIS - 1-2 VIEW COMPARISON:  CT Abdomen and Pelvis 01/23/2022. FINDINGS: AP pelvis at 0552 hours. Partially visible chronic bipolar right hip arthroplasty. Visible hardware intact with normal AP alignment. Left femoral head normally located. No pelvis fracture identified. Chronic lumbar disc and endplate degeneration. Visible bowel-gas pattern within normal limits. Incidental pelvic phleboliths. IMPRESSION: 1. No acute fracture or dislocation identified about the pelvis. 2. Partially visible chronic right hip arthroplasty. If there is lateralizing hip pain recommend dedicated hip series. Electronically Signed   By: Genevie Malley M.D.   On: 06/11/2022 06:26   DG Hand Complete Right  Result Date: 06/11/2022 CLINICAL DATA:  89 year old female with history of trauma from a fall complaining of pain in the right pinky finger and right fifth metacarpal. EXAM: RIGHT HAND - COMPLETE 3+ VIEW COMPARISON:  10/25/2018. FINDINGS: Old healed fractures of the base of the fifth metacarpal and distal radius are  noted. No  definite acute displaced fractures are identified. No dislocations. Multifocal joint space narrowing, subchondral sclerosis, subchondral cyst formation and osteophyte formation is noted, most evident in the PIP and DIP joints, as well as the radiocarpal joint, compatible with osteoarthritis. IMPRESSION: 1. No evidence of significant acute traumatic injury to the right hand. 2. Old healed fractures of the base of the fifth metacarpal and the distal radius. 3. Degenerative changes of osteoarthritis, as above. Electronically Signed   By: Vinnie Langton M.D.   On: 06/11/2022 06:24   CT Cervical Spine Wo Contrast  Result Date: 06/11/2022 CLINICAL DATA:  89 year old female status post fall backwards at 2030 hours last night. Struck head. Continued pain. EXAM: CT CERVICAL SPINE WITHOUT CONTRAST TECHNIQUE: Multidetector CT imaging of the cervical spine was performed without intravenous contrast. Multiplanar CT image reconstructions were also generated. RADIATION DOSE REDUCTION: This exam was performed according to the departmental dose-optimization program which includes automated exposure control, adjustment of the mA and/or kV according to patient size and/or use of iterative reconstruction technique. COMPARISON:  Head CT today.  Cervical spine CT 01/29/2021. FINDINGS: Alignment: Cervical lordosis has not significantly changed. Cervicothoracic junction alignment is within normal limits. Bilateral posterior element alignment is within normal limits. Skull base and vertebrae: Bone mineralization is within normal limits for age. Visualized skull base is intact. No atlanto-occipital dissociation. C1 and C2 appear intact and aligned. No acute osseous abnormality identified in the cervical spine. Soft tissues and spinal canal: No prevertebral fluid or swelling. No visible canal hematoma. Heterogeneously enlarged left thyroid lobe, in the setting of significant comorbidities or limited life expectancy, no follow-up  recommended (ref: J Am Coll Radiol. 2015 Feb;12(2): 143-50). Otherwise negative noncontrast neck soft tissues, mild for age calcified carotid atherosclerosis. Disc levels: Cervical spine degeneration appears fairly age-appropriate and not significantly changed from 2022. Mild if any associated cervical spinal stenosis. Upper chest: Mild chronic multilevel upper thoracic compression fractures, stable since 2022. Lung apices well aerated. IMPRESSION: 1. No acute traumatic injury identified in the cervical spine. 2. Mild chronic upper thoracic compression fractures. Electronically Signed   By: Genevie Jamayah M.D.   On: 06/11/2022 05:32   CT HEAD WO CONTRAST (5MM)  Result Date: 06/11/2022 CLINICAL DATA:  89 year old female status post fall backwards at 2030 hours last night. Struck head. Continued pain. EXAM: CT HEAD WITHOUT CONTRAST TECHNIQUE: Contiguous axial images were obtained from the base of the skull through the vertex without intravenous contrast. RADIATION DOSE REDUCTION: This exam was performed according to the departmental dose-optimization program which includes automated exposure control, adjustment of the mA and/or kV according to patient size and/or use of iterative reconstruction technique. COMPARISON:  Head CT 12/21/2021.  Brain MRI 08/29/2014. FINDINGS: Brain: Stable cerebral volume from last year. Chronic encephalomalacia anterior right frontal lobe, especially the inferior frontal gyrus. No superimposed midline shift, ventriculomegaly, mass effect, evidence of mass lesion, intracranial hemorrhage or evidence of cortically based acute infarction. Small chronic lacunar infarcts of the left internal capsule, central right thalamus, and left cerebellum are stable. Vascular: Calcified atherosclerosis at the skull base. No suspicious intracranial vascular hyperdensity. Skull: Chronic right frontotemporal craniotomy is stable. Occiput appears stable. No acute osseous abnormality identified. Sinuses/Orbits:  Visualized paranasal sinuses and mastoids are stable and well aerated. Other: No acute orbit or scalp soft tissue injury identified. IMPRESSION: 1. No acute traumatic injury identified. 2. Stable non contrast CT appearance of the brain. Previous craniotomy with chronic right frontal lobe encephalomalacia, and chronic lacunar infarcts in  the left internal capsule, right thalamus, and left cerebellum. Electronically Signed   By: Genevie Margree M.D.   On: 06/11/2022 05:28    Procedures Procedures    Medications Ordered in ED Medications  acetaminophen (TYLENOL) tablet 1,000 mg (1,000 mg Oral Given 06/11/22 0453)  lactated ringers bolus 500 mL (500 mLs Intravenous New Bag/Given 06/11/22 9935)    ED Course/ Medical Decision Making/ A&P                             Medical Decision Making 89 year old female who presents with concern for left hand pain after mechanical fall.  HTN on intake. VS otherwise normal. Physical exam is reassuring aside form changes to the left middle finger as above.   Amount and/or Complexity of Data Reviewed Labs: ordered.    Details: CBC without leukocytosis, mild anemia with hemoglobin 11.6 near patient's baseline of 12.  CMP with mild hyponatremia of 131, otherwise unremarkable.  CK without elevation, 30. Radiology: ordered.    Details: No evidence of acute fracture or dislocation on the plain films of the left elbow, right hand, or pelvis.  Plain films of the left hand with likely nondisplaced fracture through an osteophyte off the third proximal phalanx adjacent to the PIP joint which is the patient's location of pain.  Images visualized by this provider.   Risk OTC drugs.   Clinical picture most consistent with mechanical fall with resulting osteophyte fracture of the left middle finger.  Splint placed by this provider with normal neurovascular status distal to the injury following splint placement.  Recommend close outpatient PCP follow-up.  Laboratory studies  reassuring, clinical concern for emergent underlying injury that would warrant further ED workup or inpatient management is exceedingly low.  Flavia voiced understanding of her medical evaluation and treatment plan. Each of their questions answered to their expressed satisfaction.  Return precautions were given.  Patient is well-appearing, stable, and was discharged in good condition.  This chart was dictated using voice recognition software, Dragon. Despite the best efforts of this provider to proofread and correct errors, errors Tracey still occur which can change documentation meaning.  Final Clinical Impression(s) / ED Diagnoses Final diagnoses:  None    Rx / DC Orders ED Discharge Orders     None         Aura Dials 06/11/22 7017    Quintella Reichert, MD 06/11/22 [phone]

## 2022-06-19 ENCOUNTER — Telehealth: Payer: Self-pay

## 2022-06-19 NOTE — Telephone Encounter (Signed)
        Patient  visited [facility] on 06/11/2022  for fall, finger injury.   Telephone encounter attempt :  1st  A HIPAA compliant voice message was left requesting a return call.  Instructed patient to call back at [phone].   [place] Resource Care Guide   [email or web address]  ?? [id number]   Website: [email or web address]  [email or web address]

## 2022-06-23 ENCOUNTER — Telehealth: Payer: Self-pay

## 2022-06-23 NOTE — Telephone Encounter (Signed)
     Patient  visit on 06/11/2022  at [facility] was for fall, finger injury.  Have you been able to follow up with your primary care physician? Patient stated she spoke with her PCP.  The patient was or was not able to obtain any needed medicine or equipment. No medication prescribed.  Are there diet recommendations that you are having difficulty following? No  Patient expresses understanding of discharge instructions and education provided has no other needs at this time. Yes   [place] Resource Care Guide   [email or web address]  ?? [id number]   Website: [email or web address]  [email or web address]

## 2022-06-29 ENCOUNTER — Ambulatory Visit: Payer: Medicare Other | Admitting: Diagnostic Neuroimaging

## 2022-07-15 ENCOUNTER — Ambulatory Visit (INDEPENDENT_AMBULATORY_CARE_PROVIDER_SITE_OTHER): Payer: Medicare Other | Admitting: Podiatry

## 2022-07-15 ENCOUNTER — Encounter: Payer: Self-pay | Admitting: Podiatry

## 2022-07-15 DIAGNOSIS — E1142 Type 2 diabetes mellitus with diabetic polyneuropathy: Secondary | ICD-10-CM

## 2022-07-15 DIAGNOSIS — M2042 Other hammer toe(s) (acquired), left foot: Secondary | ICD-10-CM

## 2022-07-15 DIAGNOSIS — L84 Corns and callosities: Secondary | ICD-10-CM

## 2022-07-15 DIAGNOSIS — B351 Tinea unguium: Secondary | ICD-10-CM | POA: Diagnosis not present

## 2022-07-15 DIAGNOSIS — M79675 Pain in left toe(s): Secondary | ICD-10-CM

## 2022-07-15 DIAGNOSIS — M2041 Other hammer toe(s) (acquired), right foot: Secondary | ICD-10-CM | POA: Diagnosis not present

## 2022-07-15 DIAGNOSIS — M79674 Pain in right toe(s): Secondary | ICD-10-CM

## 2022-07-15 NOTE — Progress Notes (Signed)
Subjective:  Patient ID: [name], female    DOB: 01-24-1930,  MRN: [id number]  [name] presents to clinic today for at risk foot care with history of diabetic neuropathy and preulcerative lesion(s) L 2nd toe and submet head 1 b/l and painful mycotic toenails that limit ambulation. Painful toenails interfere with ambulation. Aggravating factors include wearing enclosed shoe gear. Pain is relieved with periodic professional debridement. Painful porokeratotic lesions are aggravated when weightbearing with and without shoegear. Pain is relieved with periodic professional debridement.  Chief Complaint  Patient presents with   Nail Problem    DFC BS-306 A1C-6.7 PCP-Boyd, Tammy PCP VST-3 weeks ago   New problem(s): None.   PCP is Bartholome Bill, MD.  Allergies  Allergen Reactions   Thayer Jew Hcl] Other (See Comments)    "extreme weakness, heaviness in legs & arms, swelling in legs/arms/face, swollen abdomen, pain in bladder, feet pain, soreness in chest"   Carbamazepine Other (See Comments)    Blood poisoning   Cholestyramine Itching, Nausea And Vomiting, Rash and Other (See Comments)    "itching rash on stomach, bloated, nausea, vomiting, sleeplessness, extreme pain in arms"  Patient is taking this in 2022.   Morphine Other (See Comments)    Feels morbid, weak, still in pain   Niacin Palpitations    Fast heart beat Other reaction(s): Unknown   Niaspan [Niacin Er] Palpitations and Other (See Comments)    "fast heart beat, high blood pressure"   Norvasc [Amlodipine Besylate] Other (See Comments)    "extreme fluid retention/pain)   Optivar [Azelastine Hcl] Photosensitivity   Repaglinide Hives   Sular [Nisoldipine Er] Other (See Comments)    "severe headaches, swelling eyes, hands, feet, shortness of breath, weak, flushed face, brain boiling, fluid retention, high blood sugar, nervous, heart fast beating"   Telmisartan Other (See Comments)    "headache, difficulty  urinating, high blood sugar, fluid retention"   Amoxicillin-Pot Clavulanate Rash    Tolerates Unasyn (>10 doses)   Cefdinir Swelling    Vaginal irritation, breathing,    Ciprofloxacin Hcl Hives   Clonidine Other (See Comments)    Dry mouth, fluid retention   Clonidine Hydrochloride Other (See Comments)    Dry mouth, fluid retention   Codeine Nausea And Vomiting   Ezetimibe Other (See Comments)    Made weak   Hydroxychloroquine Other (See Comments)    Low platlets   Naproxen Other (See Comments)    Shrinks bladder   Sulfa Antibiotics Rash   Ziac [Bisoprolol-Hydrochlorothiazide] Other (See Comments)    "stopped urination"   Amlodipine Besy-Benazepril Hcl Cough   Azelastine Other (See Comments)    Unknown reaction - Per MAR   Elavil [Amitriptyline] Other (See Comments)    Gave Pt nightmares   Empagliflozin Other (See Comments)    "Caused yeast infection, slowed my urine"   Hydralazine Other (See Comments)    "does not reduce high blood pressure, pain in arm, high pressure, felt like I was on verge of heart attack, really weak"   Iodine I 131 Tositumomab Other (See Comments)    Unknown reaction - Per MAR   Kenalog [Triamcinolone] Diarrhea    Per MAR   Keppra [Levetiracetam] Other (See Comments)    Shaking   Lamotrigine Itching and Other (See Comments)   Misc. Sulfonamide Containing Compounds    Pregabalin Swelling and Other (See Comments)    Weight gain   Pseudoephedrine Other (See Comments)    Unknown reaction   Pseudoephedrine-Guaifenesin Er  Other (See Comments)    Unknown reaction - Per MAR   Risedronate     Unknown reaction - Per MAR   Ru-Hist D [Brompheniramine-Phenylephrine] Other (See Comments)    Unknown reaction - Per MAR   Sumatriptan Other (See Comments)   Topiramate Other (See Comments)    Dry eyes   Ace Inhibitors Other (See Comments)    unknown   Actonel [Risedronate Sodium] Other (See Comments)    unknown   Aspirin Other (See Comments)    Unknown  reaction   Atacand [Candesartan] Other (See Comments)    Unknown reaction - MAR   Bextra [Valdecoxib] Other (See Comments)    Unknown reaction - Per MAR   Bisoprolol-Hydrochlorothiazide Other (See Comments)    Unknown reaction - Per MAR   Cefadroxil Other (See Comments)    Unknown reaction   Celecoxib Rash   Hydrocodone Other (See Comments)    unknown   Hydrocodone-Acetaminophen Other (See Comments)    unknown   Iodinated Contrast Media Rash and Other (See Comments)    "All over" rash   Meloxicam Other (See Comments)    unknown   Methylprednisolone Sodium Succinate Other (See Comments)    unknown   Nabumetone Other (See Comments)    Unknown reaction   Penicillins Other (See Comments)    unknown   Pseudoephedrine-Guaifenesin Other (See Comments)    unknown   Risedronate Sodium Other (See Comments)    unknown   Rofecoxib Other (See Comments)    Unknown reaction - MAR Other reaction(s): Unknown   Ru-Tuss [Chlorphen-Pse-Atrop-Hyos-Scop] Other (See Comments)    unknown   Sulfonamide Derivatives Other (See Comments)    unknown   Sulphur [Elemental Sulfur] Other (See Comments)    unknown   Telithromycin Other (See Comments)    unknown   Terfenadine Other (See Comments)    Unknown reaction - Per MAR   Trandolapril-Verapamil Hcl Er Other (See Comments)    Headache, difficulty urinating, high blood sugar, fluid retention  Pt is taking Tarka (trandolapril-verapamil) currently, but requests the medication stay in her allergy list   Trandolapril-Verapamil Hcl Er Other (See Comments)    Headache, difficulty urinating, high blood sugar, fluid retention  Pt is taking Tarka (trandolapril-verapamil) currently, but requests the medication stay in her allergy list   Valium [Diazepam] Other (See Comments)    Makes her mean and hyper    Review of Systems: Negative except as noted in the HPI.  Objective: No changes noted in today's physical examination. There were no vitals filed for  this visit.  [name] is a pleasant 89 y.o. female WD, WN in NAD. AAO x 3.  Vascular Examination: CFT immediate b/l LE. Faintly palpable DP/PT pulses b/l LE. Digital hair absent b/l. Skin temperature gradient WNL b/l. No pain with calf compression b/l. No edema noted b/l. No cyanosis or clubbing noted b/l LE.  Dermatological Examination: No open wounds b/l LE. No interdigital macerations noted b/l LE. Toenails 1-5 bilaterally elongated, discolored, dystrophic, thickened, and crumbly with subungual debris and tenderness to dorsal palpation.   Preulcerative lesion noted submet head 1 b/l and dorsal PIPJ left 2nd toe. There is visible subdermal hemorrhage. There is no surrounding erythema, no edema, no drainage, no odor, no fluctuance.  Neurological Examination: Protective sensation diminished with 10g monofilament b/l. Vibratory sensation intact b/l.  Musculoskeletal Examination: Muscle strength 5/5 to all LE muscle groups of b/l lower extremities. Hammertoe deformity noted 1-5 b/l. Wearing diabetic shoes today. Utilizes rollator for  ambulation assistance.  Assessment/Plan: 1. Pain due to onychomycosis of toenails of both feet   2. Pre-ulcerative calluses   3. Acquired hammertoes of both feet   4. Diabetic peripheral neuropathy associated with type 2 diabetes mellitus ([place])     Orders Placed This Encounter  Procedures   For Home Use Only DME Diabetic Shoe    Dispense one pair extra depth shoes and 3 pair custom molded insoles. Offload calluses submetatarsal head 1 bilaterally. Choose shoes with stretchable uppers to accommodate hammertoe left 2nd digit with preulcerative corn.   FOR HOME USE ONLY DME DIABETIC SHOE -Patient was evaluated and treated. All patient's and/or POA's questions/concerns answered on today's visit. -Discussed diabetic shoe benefit available based on patient's diagnoses. Patient/POA would like to proceed. Order entered for one pair extra depth shoes and 3 pair  custom molded insoles. Patient qualifies based on diagnoses. -Toenails 1-5 b/l were debrided in length and girth with sterile nail nippers and dremel without iatrogenic bleeding.  -Preulcerative lesion pared left second digit and submet head 1 b/l utilizing sterile scalpel blade. Total number pared=3. -Patient/POA to call should there be question/concern in the interim.   Return in about 3 months (around 10/15/2022).  Marzetta Board, DPM

## 2022-08-04 ENCOUNTER — Ambulatory Visit (INDEPENDENT_AMBULATORY_CARE_PROVIDER_SITE_OTHER): Payer: Medicare Other

## 2022-08-04 DIAGNOSIS — M2041 Other hammer toe(s) (acquired), right foot: Secondary | ICD-10-CM

## 2022-08-04 DIAGNOSIS — M2042 Other hammer toe(s) (acquired), left foot: Secondary | ICD-10-CM

## 2022-08-04 DIAGNOSIS — E1142 Type 2 diabetes mellitus with diabetic polyneuropathy: Secondary | ICD-10-CM

## 2022-08-04 NOTE — Progress Notes (Signed)
Patient presents to the office today for diabetic shoe and insole measuring.  Patient was measured with brannock device to determine size and width for 1 pair of extra depth shoes and foam casted for 3 pair of insoles.   ABN signed.   Documentation of medical necessity will be sent to patient's treating diabetic doctor to verify and sign.   Patient's diabetic provider: William Hamburger, [phone]   Shoes and insoles will be ordered at that time and patient will be notified for an appointment for fitting when they arrive.    Patient shoe selection-   1st   Shoe choice:   [id number]  Shoe size ordered: 29M

## 2022-08-24 IMAGING — DX DG KNEE 1-2V PORT*R*
2 series · 2 of 2 positions shown · non-contrast
Comparison: None.

CLINICAL DATA: Right femoral neck fracture

EXAM:
PORTABLE RIGHT KNEE - 1-2 VIEW

[knee lat (1 of 2)]
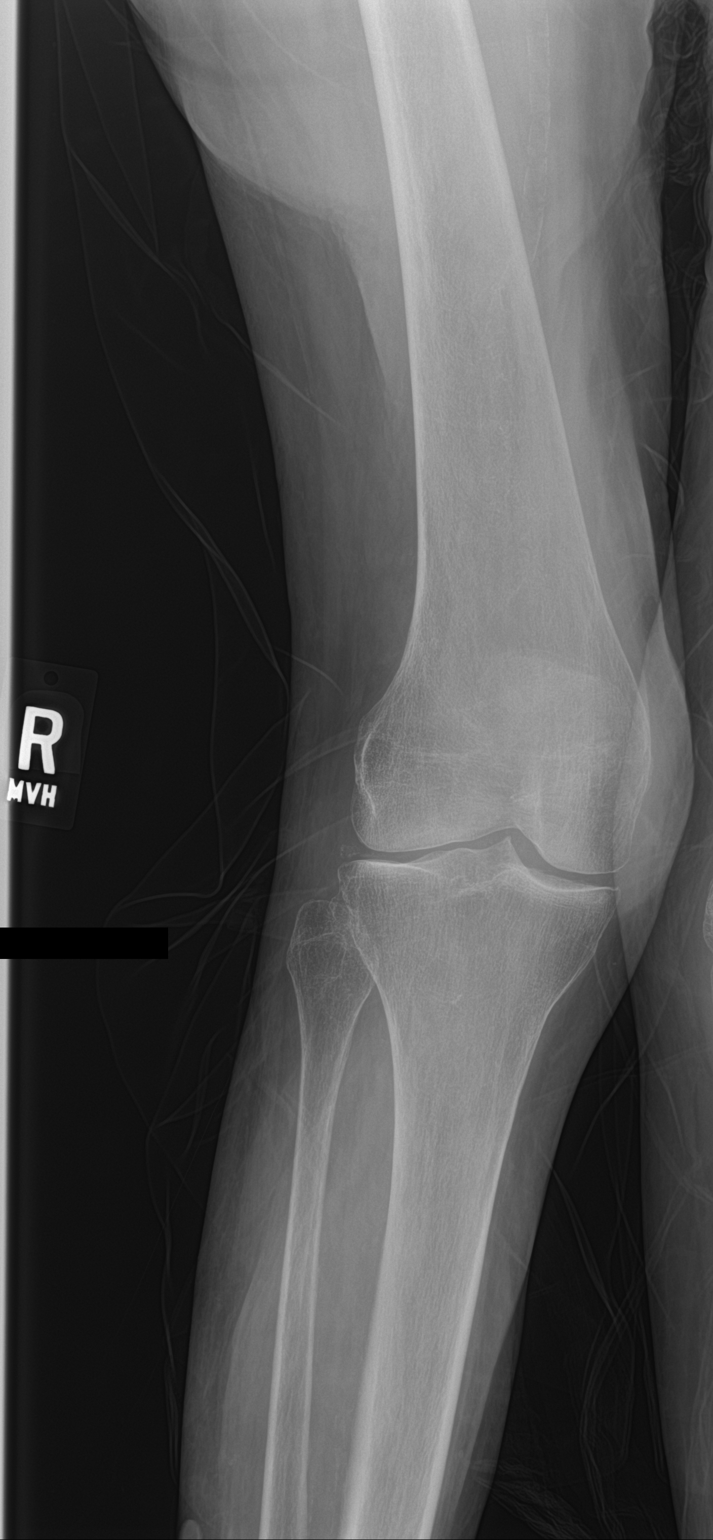

[knee lat (2 of 2)]
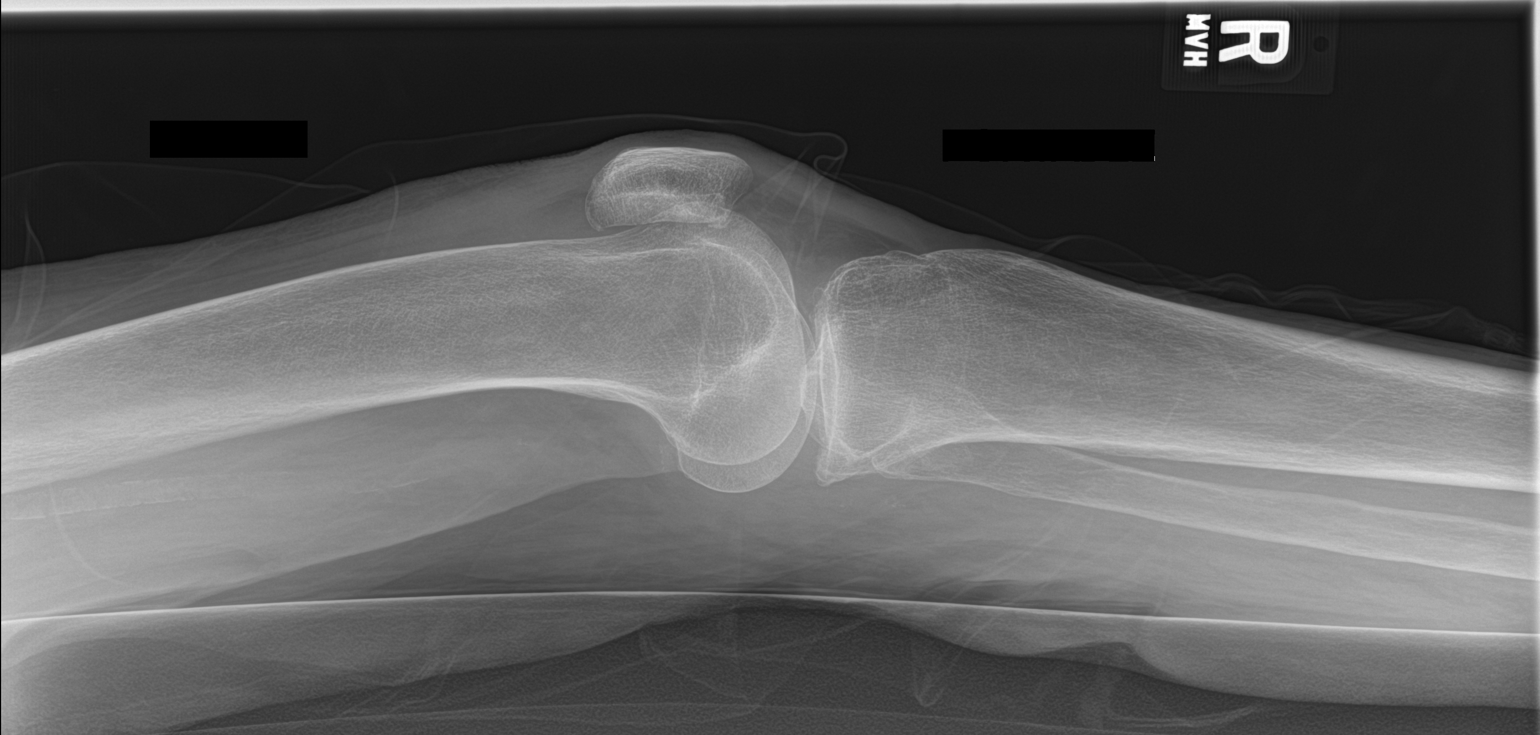

[2 of 2 positions shown; findings below may reference images not displayed]

FINDINGS: Alignment is anatomic. There is no acute fracture. No joint
effusion. Mild degenerative changes are present.
IMPRESSION: No acute fracture.

## 2022-09-03 ENCOUNTER — Other Ambulatory Visit: Payer: Self-pay | Admitting: Family Medicine

## 2022-09-03 DIAGNOSIS — Z1231 Encounter for screening mammogram for malignant neoplasm of breast: Secondary | ICD-10-CM

## 2022-09-21 ENCOUNTER — Encounter: Payer: Self-pay | Admitting: Neurology

## 2022-09-21 ENCOUNTER — Ambulatory Visit (INDEPENDENT_AMBULATORY_CARE_PROVIDER_SITE_OTHER): Payer: Medicare Other | Admitting: Neurology

## 2022-09-21 VITALS — BP 133/69 | HR 75 | Ht 67.5 in | Wt 140.0 lb

## 2022-09-21 DIAGNOSIS — G250 Essential tremor: Secondary | ICD-10-CM | POA: Diagnosis not present

## 2022-09-21 DIAGNOSIS — G40909 Epilepsy, unspecified, not intractable, without status epilepticus: Secondary | ICD-10-CM | POA: Diagnosis not present

## 2022-09-21 DIAGNOSIS — R269 Unspecified abnormalities of gait and mobility: Secondary | ICD-10-CM

## 2022-09-21 MED ORDER — PRIMIDONE 250 MG PO TABS: 250.0000 mg | ORAL_TABLET | Freq: Every day | ORAL | 3 refills | Status: DC

## 2022-09-21 MED ORDER — PRIMIDONE 50 MG PO TABS: 150.0000 mg | ORAL_TABLET | Freq: Every day | ORAL | 3 refills | Status: DC

## 2022-09-21 NOTE — Progress Notes (Signed)
[facility]  PATIENT: [name] DOB: 1929-08-25  REQUESTING CLINICIAN: Verlon Au, MD HISTORY FROM: Patient, niece and chart review  REASON FOR VISIT: Seizure   HISTORICAL  CHIEF COMPLAINT:  Chief Complaint  Patient presents with   Follow-up    Rm 12. Accompanied by niece, [name]. Denies any new seizure activity. She has had a few falls.    INTERVAL HISTORY 09/21/2022:  [name] presents today for follow-up, she is accompanied by her niece [name].  Last visit was in October, since then she has not had any seizure or seizure-like activity.  She is compliant with the phenytoin XR 300 mg nightly.  In terms of the essential tremor, she reports that sometimes in the evening she has worsening of her symptoms.  She is on primidone 250 in the morning and 50 mg in the evening but sometimes reports in the evening she can take up to 150 mg of primidone to control her symptoms.  She continued to have gait abnormality and reports recent falls for which she had to call EMS.  Denies hitting her head.  Overall she is stable.  She is independent but limited by her gait abnormality and overall weakness.   HISTORY OF PRESENT ILLNESS:  This is a 89 year old woman with multiple medical conditions including seizure disorder, essential tremor, type 2 diabetes mellitus, hypertension, right frontal meningioma s/p resection in 2015, colon cancer who is presenting for follow-up for seizure disorder.  Patient was previously seen by Dr. Anne Hahn and Dr. Marjory Lies for her seizures.  His last visit with Dr. Danae Orleans was in February 2023, at that time plan was to continue with phenytoin 200 mg in the evening and also primidone 250 mg in the morning for her essential tremor.  In August she was admitted to the hospital for seizure work-up versus syncope.  Patient reports she was at home with a friend and then passed out on the couch, the friend could not arouse her and therefore she called  EMS.  In the ED, was noted to be hyponatremic to 129, was observed in the emergency department and her condition continued to improve after IV hydration.  On discharge, she was recommended to increase her Dilantin to 300 mg.  Since her hospitalization in August she has not had any additional seizures.  She did present to the hospital however for small bowel obstruction.  In terms of the seizure we have checked her Dilantin which was low and plan was to increase it to 350 but due to miscommunication patient is actually taking 250 mg of Dilantin nightly, again no seizures.  In terms of the tremors, she reports that tremors are still bothersome with eating, she spilled her food has difficulty using a fork she is currently on primidone to 50 in the morning    PRIOR HPI (12/24/20; Dr. Anne Hahn): "Ms. [name] is an 89 year old right-handed white female with a history of a seizure disorder, her last seizure was on 19 Labell 2022.  The patient was in an extended care facility at that time and she is not completely sure that she was getting her medications properly.  She has a chronic gait disorder, she has fallen on occasion, she has not fallen recently.  She uses a walker to get around.  She has an essential tremor that affects her ability to feed herself at times.  She is on Mysoline taking 150 mg in the morning and 100 mg in the evening.  She has history of  diabetes.  She continues to be concerned about the tremor.  She wishes to have something done about this issue.  She questions whether ultrasound ablation of the brain Cork be something she could consider."   PRIOR HPI (08/15/14, VRP): 89 year old female here for evaluation of tongue laceration and possible seizures. In 2015 patient was having intermittent episodes of falling down. There is question of whether she had unresponsiveness with these events. She was initially evaluated for syncopal workup with heart testing. Ultimately she was admitted to the hospital in June  2015 for confusion, altered mental status, recurrent falls. She's found to have a large right frontal meningioma and right subdural hematoma. Patient was treated with surgical resection, Decadron and antiseizure medication. Neurology consult was obtained who felt patient was also having intermittent complex partial seizures. She was discharged on Keppra 500 mg twice a day. 2 weeks after discharge patient stopped his medication on her own due to side effects of jitteriness, dizziness.   Patient continues to live on her own, continues to have intermittent falls and confusion spells. In 07/18/2014 patient woke up with large amount of blood on her pillow and bed sheets. She had a severe tongue laceration. No muscle aches, incontinence, confusion. The next day patient went to the hospital for kyphoplasty procedure. On 07/20/2014 patient went to the emergency room for evaluation of possible infection of tongue laceration. She followed up with ENT Dr. Pollyann Kennedy. Due to history of meningioma and seizures, patient was referred back to me for consultation.   Patient still drives, short distances to church and grocery store. She is also had lower extremity weakness and numbness. She has diabetes   Previous ASMs: Levetiracetam, Lamotrigine, Carbamazepine, Topiramate   Currenty ASMs: Phenytoin 250 mg (she should be taking 350 mg), Primidone 250 mg     OTHER MEDICAL CONDITIONS: Seizure disorder, essential tremor, type 2 diabetes mellitus, hypertension, right frontal meningioma s/p resection in 2015, colon cancer  REVIEW OF SYSTEMS: Full 14 system review of systems performed and negative with exception of: As noted in the HPI   ALLERGIES: Allergies  Allergen Reactions   Bystolic [Nebivolol Hcl] Other (See Comments)    "extreme weakness, heaviness in legs & arms, swelling in legs/arms/face, swollen abdomen, pain in bladder, feet pain, soreness in chest"   Carbamazepine Other (See Comments)    Blood poisoning    Cholestyramine Itching, Nausea And Vomiting, Rash and Other (See Comments)    "itching rash on stomach, bloated, nausea, vomiting, sleeplessness, extreme pain in arms"  Patient is taking this in 2022.   Morphine Other (See Comments)    Feels morbid, weak, still in pain   Niacin Palpitations    Fast heart beat Other reaction(s): Unknown   Niaspan [Niacin Er] Palpitations and Other (See Comments)    "fast heart beat, high blood pressure"   Norvasc [Amlodipine Besylate] Other (See Comments)    "extreme fluid retention/pain)   Optivar [Azelastine Hcl] Photosensitivity   Repaglinide Hives   Sular [Nisoldipine Er] Other (See Comments)    "severe headaches, swelling eyes, hands, feet, shortness of breath, weak, flushed face, brain boiling, fluid retention, high blood sugar, nervous, heart fast beating"   Telmisartan Other (See Comments)    "headache, difficulty urinating, high blood sugar, fluid retention"   Amoxicillin-Pot Clavulanate Rash    Tolerates Unasyn (>10 doses)   Cefdinir Swelling    Vaginal irritation, breathing,    Ciprofloxacin Hcl Hives   Clonidine Other (See Comments)    Dry mouth, fluid retention  Clonidine Hydrochloride Other (See Comments)    Dry mouth, fluid retention   Codeine Nausea And Vomiting   Ezetimibe Other (See Comments)    Made weak   Hydroxychloroquine Other (See Comments)    Low platlets   Naproxen Other (See Comments)    Shrinks bladder   Sulfa Antibiotics Rash   Ziac [Bisoprolol-Hydrochlorothiazide] Other (See Comments)    "stopped urination"   Amlodipine Besy-Benazepril Hcl Cough   Azelastine Other (See Comments)    Unknown reaction - Per MAR   Elavil [Amitriptyline] Other (See Comments)    Gave Pt nightmares   Empagliflozin Other (See Comments)    "Caused yeast infection, slowed my urine"   Hydralazine Other (See Comments)    "does not reduce high blood pressure, pain in arm, high pressure, felt like I was on verge of heart attack, really  weak"   Iodine I 131 Tositumomab Other (See Comments)    Unknown reaction - Per MAR   Kenalog [Triamcinolone] Diarrhea    Per MAR   Keppra [Levetiracetam] Other (See Comments)    Shaking   Lamotrigine Itching and Other (See Comments)   Misc. Sulfonamide Containing Compounds    Pregabalin Swelling and Other (See Comments)    Weight gain   Pseudoephedrine Other (See Comments)    Unknown reaction   Pseudoephedrine-Guaifenesin Er Other (See Comments)    Unknown reaction - Per MAR   Risedronate     Unknown reaction - Per MAR   Ru-Hist D [Brompheniramine-Phenylephrine] Other (See Comments)    Unknown reaction - Per MAR   Sumatriptan Other (See Comments)   Topiramate Other (See Comments)    Dry eyes   Ace Inhibitors Other (See Comments)    unknown   Actonel [Risedronate Sodium] Other (See Comments)    unknown   Aspirin Other (See Comments)    Unknown reaction   Atacand [Candesartan] Other (See Comments)    Unknown reaction - MAR   Bextra [Valdecoxib] Other (See Comments)    Unknown reaction - Per MAR   Bisoprolol-Hydrochlorothiazide Other (See Comments)    Unknown reaction - Per MAR   Cefadroxil Other (See Comments)    Unknown reaction   Celecoxib Rash   Hydrocodone Other (See Comments)    unknown   Hydrocodone-Acetaminophen Other (See Comments)    unknown   Iodinated Contrast Media Rash and Other (See Comments)    "All over" rash   Meloxicam Other (See Comments)    unknown   Methylprednisolone Sodium Succinate Other (See Comments)    unknown   Nabumetone Other (See Comments)    Unknown reaction   Penicillins Other (See Comments)    unknown   Pseudoephedrine-Guaifenesin Other (See Comments)    unknown   Risedronate Sodium Other (See Comments)    unknown   Rofecoxib Other (See Comments)    Unknown reaction - MAR Other reaction(s): Unknown   Ru-Tuss [Chlorphen-Pse-Atrop-Hyos-Scop] Other (See Comments)    unknown   Sulfonamide Derivatives Other (See Comments)     unknown   Sulphur [Elemental Sulfur] Other (See Comments)    unknown   Telithromycin Other (See Comments)    unknown   Terfenadine Other (See Comments)    Unknown reaction - Per MAR   Trandolapril-Verapamil Hcl Er Other (See Comments)    Headache, difficulty urinating, high blood sugar, fluid retention  Pt is taking Tarka (trandolapril-verapamil) currently, but requests the medication stay in her allergy list   Trandolapril-Verapamil Hcl Er Other (See Comments)    Headache, difficulty  urinating, high blood sugar, fluid retention  Pt is taking Tarka (trandolapril-verapamil) currently, but requests the medication stay in her allergy list   Valium [Diazepam] Other (See Comments)    Makes her mean and hyper    HOME MEDICATIONS: Outpatient Medications Prior to Visit  Medication Sig Dispense Refill   acetaminophen (TYLENOL) 500 MG tablet Take 2 tablets (1,000 mg total) by mouth 3 (three) times daily. (Patient taking differently: Take 1,000 mg by mouth every 8 (eight) hours as needed for moderate pain.) 30 tablet 0   ALPRAZolam (XANAX) 0.25 MG tablet Take 1 tablet (0.25 mg total) by mouth 2 (two) times daily as needed for anxiety. 10 tablet 0   Biotin 5000 MCG CAPS Take 5,000 mcg by mouth daily.     capsicum (ZOSTRIX) 0.075 % topical cream Apply 1 Application topically 4 (four) times daily as needed (pain).     Cholecalciferol (VITAMIN D-3) 125 MCG (5000 UT) TABS Take 5,000 Units by mouth daily.     cholestyramine (QUESTRAN) 4 g packet Take 4 g by mouth 2 (two) times daily.     cyanocobalamin (,VITAMIN B-12,) 1000 MCG/ML injection Inject 1,000 mcg into the muscle every 21 ( twenty-one) days.     diclofenac Sodium (VOLTAREN) 1 % GEL Apply 2 g topically 4 (four) times daily as needed (for knee and back pain).     diphenhydrAMINE (BENADRYL) 25 mg capsule Take 25 mg by mouth every 8 (eight) hours as needed for allergies or sleep.     glucose blood (GLUCOSE METER TEST) test strip See admin  instructions.     Glucose Blood (PRODIGY VOICE BLOOD GLUCOSE VI)      insulin aspart (NOVOLOG) 100 UNIT/ML FlexPen Inject 2-12 Units into the skin See admin instructions. Injects 2-10 units of insulin under the skin 3 times a day per sliding scale: CBG 0-150: 2 units; CBG 151-200: 4 units; CBG 201-250: 6 units; CBG 251-300: 8 units; CBG 301-350: 10 units; CBG 351-400: 12 units; CBG >400: 12 units and notify the MD/NP (Patient taking differently: Inject 2-12 Units into the skin See admin instructions. Inject 2-10 units into the skin 3 times a day per sliding scale: CBG 0-150: 2 units; CBG 151-200: 4 units; CBG 201-250: 6 units; CBG 251-300: 8 units; CBG 301-350: 10 units; CBG 351-400: 12 units; CBG >400: 12 units and notify the MD/NP) 15 mL 0   insulin glargine (LANTUS SOLOSTAR) 100 UNIT/ML Solostar Pen Inject 10 Units into the skin at bedtime. (Patient taking differently: Inject 10 Units into the skin See admin instructions. Inject 10 units into the skin every afternoon- when eating)     Insulin Pen Needle 32G X 4 MM MISC Used to inject insulin 3x daily (Patient taking differently: 1 each by Other route See admin instructions. Used to inject insulin 3x daily) 270 each 2   levothyroxine (SYNTHROID) 75 MCG tablet Take 1 tablet (75 mcg total) by mouth daily before breakfast. 30 tablet 0   PHENYTEK 300 MG ER capsule TAKE 1 CAPSULE AT BEDTIME 90 capsule 3   Polyethyl Glycol-Propyl Glycol (SYSTANE) 0.4-0.3 % SOLN Place 2 drops into both eyes 3 (three) times daily.     polyethylene glycol (MIRALAX / GLYCOLAX) 17 g packet Take 17 g by mouth daily as needed for mild constipation.     thiamine 250 MG tablet Take 250 mg by mouth daily.     traMADol (ULTRAM) 50 MG tablet Take 1 tablet (50 mg total) by mouth every 8 (eight) hours  as needed for moderate pain. 10 tablet 0   Verapamil HCl CR 200 MG CP24 Take 200 mg by mouth daily.     primidone (MYSOLINE) 250 MG tablet Take 250 mg by mouth at bedtime.     primidone  (MYSOLINE) 50 MG tablet Take 1 tablet (50 mg total) by mouth at bedtime. 90 tablet 3   Blood Glucose Monitoring Suppl (PRODIGY VOICE BLOOD GLUCOSE) w/Device KIT Use to check blood sugar 1 time per day. (Patient not taking: Reported on 09/21/2022) 1 each 2   pantoprazole (PROTONIX) 40 MG tablet Take 40 mg by mouth daily.     phenytoin (DILANTIN) 50 MG tablet Chew 1 tablet (50 mg total) by mouth daily. (Patient not taking: Reported on 09/21/2022) 90 tablet 3   No facility-administered medications prior to visit.    PAST MEDICAL HISTORY: Past Medical History:  Diagnosis Date   Anemia    Anxiety    Asthma    Breast cancer (HCC) 2014   right breast   Cancer of right breast (HCC) 12/26/2012   right breast 12:00 o'clock, DCIS   Carotid artery disease (HCC)    Carpal tunnel syndrome, bilateral    Chronic bronchitis (HCC)    Chronic cough    Chronic facial pain    Chronic foot pain    Colon cancer (HCC) dx'd 11/2019   Complication of anesthesia    Sore jaw; could not chew or move mouth, prolonged sedation   Convulsions/seizures (HCC) 10/16/2014   Diabetes mellitus    type 2 niddm x 20 years   Dyslipidemia    Ejection fraction    Gait abnormality 12/04/2019   GERD (gastroesophageal reflux disease)    Hammer toe    bilateral   History of colonic polyps    History of meningioma    HTN (hypertension)    Hx of radiation therapy 03/07/13- 03/29/13   right breast 4250 cGy 17 sessions   Hyperlipidemia    Hypokalemia    Hyponatremia    Hypothyroidism    IBS (irritable bowel syndrome)    Melanoma (HCC)    Metatarsal bone fracture 2014   Multiple drug allergies    Nausea & vomiting 03/12/2020   Nontoxic thyroid nodule    Obesity    Osteoarthritis    Osteoporosis    Palpitations    Personal history of radiation therapy 2014   Seasonal allergies    Skin cancer    Syncope    Tremor, essential 08/18/2016   Vitamin B12 deficiency    Vitamin D deficiency     PAST SURGICAL  HISTORY: Past Surgical History:  Procedure Laterality Date   ABDOMINAL HYSTERECTOMY     BIOPSY  11/07/2019   Procedure: BIOPSY;  Surgeon: Bernette Redbird, MD;  Location: [facility];  Service: Endoscopy;;   BRAIN SURGERY     BREAST BIOPSY Right 01/24/2013   Procedure: RE-EXCICION OF BREAST CANCER, ANTERIOR MARGINS;  Surgeon: Mariella Saa, MD;  Location: [facility];  Service: General;  Laterality: Right;   BREAST LUMPECTOMY Right 2014   BREAST LUMPECTOMY WITH NEEDLE LOCALIZATION Right 01/17/2013   Procedure: BREAST LUMPECTOMY WITH NEEDLE LOCALIZATION;  Surgeon: Mariella Saa, MD;  Location: [facility];  Service: General;  Laterality: Right;   BUNIONECTOMY Bilateral    CATARACT EXTRACTION W/ INTRAOCULAR LENS IMPLANT Right    CHOLECYSTECTOMY     COLONOSCOPY     COLONOSCOPY WITH PROPOFOL N/A 11/07/2019   Procedure: COLONOSCOPY WITH PROPOFOL;  Surgeon: Bernette Redbird, MD;  Location:  [facility];  Service: Endoscopy;  Laterality: N/A;   CRANIOTOMY Right 10/18/2013   Procedure: CRANIOTOMY TUMOR EXCISION;  Surgeon: Karn Cassis, MD;  Location: [facility];  Service: Neurosurgery;  Laterality: Right;   ESOPHAGOGASTRODUODENOSCOPY (EGD) WITH PROPOFOL N/A 11/07/2019   Procedure: ESOPHAGOGASTRODUODENOSCOPY (EGD) WITH PROPOFOL;  Surgeon: Bernette Redbird, MD;  Location: [facility];  Service: Endoscopy;  Laterality: N/A;   EYE SURGERY     HERNIA REPAIR     IR THORACENTESIS ASP PLEURAL SPACE W/IMG GUIDE  04/01/2020   KNEE ARTHROSCOPY Bilateral    LAPAROSCOPIC RIGHT HEMI COLECTOMY Right 03/06/2020   Procedure: LAPAROSCOPIC RIGHT HEMI COLECTOMY WITH TAP BLOCK AND LYSIS OF ADHESIONS;  Surgeon: Andria Meuse, MD;  Location: [facility];  Service: General;  Laterality: Right;   POLYPECTOMY     small adenomatous   TOTAL HIP ARTHROPLASTY Right 06/05/2020   Procedure: ARTHROPLASTY  ANTERIOR APPROACH. RIGHT HIP;  Surgeon: Samson Frederic, MD;  Location: [facility];  Service: Orthopedics;  Laterality: Right;    ULNAR TUNNEL RELEASE      FAMILY HISTORY: Family History  Problem Relation Age of Onset   Heart disease Mother    Osteoporosis Mother    Diabetes Father    Pancreatic cancer Father    Bone cancer Sister    Rectal cancer Sister    Thyroid disease Sister        benign goiter resected   Prostate cancer Brother    Colon cancer Brother    Colon cancer Other     SOCIAL HISTORY: Social History   Socioeconomic History   Marital status: Widowed    Spouse name: Not on file   Number of children: 0   Years of education: college   Highest education level: Not on file  Occupational History   Occupation: retired  Tobacco Use   Smoking status: Never   Smokeless tobacco: Never  Vaping Use   Vaping Use: Never used  Substance and Sexual Activity   Alcohol use: No   Drug use: No   Sexual activity: Not Currently    Comment: menarche age 81, hysterectomy age 56, HRT x 2-3 mos, G1- miscarriage  Other Topics Concern   Not on file  Social History Narrative   12/24/20 Widowed, lives alone. Has caregivers 8 hrs a day, no one at night- 12/29/21     Ambulates independently, uses walker at home intermittently    Drinks decaf only   Patient is right handed.   Social Determinants of Health   Financial Resource Strain: Not on file  Food Insecurity: No Food Insecurity (01/24/2022)   Hunger Vital Sign    Worried About Running Out of Food in the Last Year: Never true    Ran Out of Food in the Last Year: Never true  Transportation Needs: No Transportation Needs (01/24/2022)   PRAPARE - Administrator, Civil Service (Medical): No    Lack of Transportation (Non-Medical): No  Physical Activity: Not on file  Stress: Not on file  Social Connections: Not on file  Intimate Partner Violence: Not At Risk (01/24/2022)   Humiliation, Afraid, Rape, and Kick questionnaire    Fear of Current or Ex-Partner: No    Emotionally Abused: No    Physically Abused: No    Sexually Abused: No     PHYSICAL EXAM  GENERAL EXAM/CONSTITUTIONAL: Vitals:  Vitals:   09/21/22 1348  BP: 133/69  Pulse: 75  Weight: 140 lb (63.5 kg)  Height: 5' 7.5" (1.715 m)  Body mass index is 21.6 kg/m. Wt Readings from Last 3 Encounters:  09/21/22 140 lb (63.5 kg)  05/11/22 139 lb (63 kg)  02/16/22 140 lb (63.5 kg)   Patient is in no distress; well developed, nourished and groomed; neck is supple  MUSCULOSKELETAL: Gait, strength, tone, movements noted in Neurologic exam below  NEUROLOGIC: MENTAL STATUS:     03/28/2018   11:47 AM 08/31/2017   10:44 AM  MMSE - Mini Mental State Exam  Orientation to time 5 5  Orientation to Place 4 5  Registration 3 3  Attention/ Calculation 5 4  Recall 2 2  Language- name 2 objects 2 2  Language- repeat 1 1  Language- follow 3 step command 3 3  Language- read & follow direction 1 1  Write a sentence 1 1  Copy design 1 0  Total score 28 27   awake, alert, oriented to person, place and time recent and remote memory intact normal attention and concentration language fluent, comprehension intact, naming intact fund of knowledge appropriate  CRANIAL NERVE:  2nd, 3rd, 4th, 6th - visual fields full to confrontation, extraocular muscles intact, no nystagmus 5th - facial sensation symmetric 7th - facial strength symmetric 8th - hearing intact 9th - palate elevates symmetrically, uvula midline 11th - shoulder shrug symmetric 12th - tongue protrusion midline  MOTOR:  normal bulk and tone, at least antigravity in the BUE and BLE  SENSORY:  normal and symmetric to light touch  COORDINATION:  There is presence of action tremors   GAIT/STATION:  Use a walker     DIAGNOSTIC DATA (LABS, IMAGING, TESTING) - I reviewed patient records, labs, notes, testing and imaging myself where available.  Lab Results  Component Value Date   WBC 10.4 06/11/2022   HGB 11.6 (L) 06/11/2022   HCT 35.2 (L) 06/11/2022   MCV 92.9 06/11/2022   PLT 334  06/11/2022      Component Value Date/Time   NA 131 (L) 06/11/2022 0433   NA 130 (L) 12/24/2020 1143   NA 138 01/04/2013 1212   K 4.1 06/11/2022 0433   K 3.9 01/04/2013 1212   CL 97 (L) 06/11/2022 0433   CO2 24 06/11/2022 0433   CO2 22 01/04/2013 1212   GLUCOSE 109 (H) 06/11/2022 0433   GLUCOSE 285 (H) 01/04/2013 1212   BUN 12 06/11/2022 0433   BUN 14 12/24/2020 1143   BUN 10.3 01/04/2013 1212   CREATININE 0.62 06/11/2022 0433   CREATININE 0.78 12/01/2013 1659   CREATININE 1.0 01/04/2013 1212   CALCIUM 8.6 (L) 06/11/2022 0433   CALCIUM 9.6 01/04/2013 1212   PROT 6.2 (L) 06/11/2022 0433   PROT 6.3 12/24/2020 1143   PROT 6.4 01/04/2013 1212   ALBUMIN 3.6 06/11/2022 0433   ALBUMIN 4.2 12/24/2020 1143   ALBUMIN 3.6 01/04/2013 1212   AST 19 06/11/2022 0433   AST 14 01/04/2013 1212   ALT 18 06/11/2022 0433   ALT 16 01/04/2013 1212   ALKPHOS 86 06/11/2022 0433   ALKPHOS 52 01/04/2013 1212   BILITOT 0.5 06/11/2022 0433   BILITOT <0.2 12/24/2020 1143   BILITOT 1.01 01/04/2013 1212   GFRNONAA >60 06/11/2022 0433   GFRAA >60 01/02/2020 0455   Lab Results  Component Value Date   CHOL 148 01/30/2013   HDL 47.10 01/30/2013   LDLCALC 65 01/30/2013   LDLDIRECT 85.7 09/05/2009   TRIG 179.0 (H) 01/30/2013   Lab Results  Component Value Date   HGBA1C 6.4 (H) 01/24/2022  Lab Results  Component Value Date   VITAMINB12 2,750 (H) 06/04/2020   Lab Results  Component Value Date   TSH 3.062 12/21/2021    Phenytoin level 7.9 (01/13/22) 7.4 (01/06/22) 11.3 (12/24/20)    ASSESSMENT AND PLAN  89 y.o. year old female  with seizure disorder, essential tremor, type 2 diabetes mellitus, hypertension, right frontal meningioma s/p resection in 2015, colon cancer who is presenting for for follow up for her seizure and essential tremors.  For her seizures, we will continue her on phenytoin ER 300 mg nightly.  In terms of her essential tremor, we will continue her on primidone 250 mg in the  morning and increase the evening dose to 100 mg for 1 week then further increase to 150 mg nightly, her new dose will be 250 mg in the morning and 150 mg in the evening for essential tremors.  Advised her to contact me if she does have side effect of the medicine or if the medication is not enough to control her symptoms.  I will see her in 6 months for follow-up or sooner if worse.  Both patient and her niece [name] voiced understanding and are comfortable with plans.    1. Seizure disorder (HCC)   2. Essential tremor   3. Gait abnormality     Patient Instructions  Continue with Phenytoin ER 300 mg at bedtime  Increase Primidone to 250 mg in the morning and 100 mg in the evening for one week then further increase to 250 mg in the morning and 150 mg in the evening.  Continue your other medications  Continue using the walker  Follow up in 6 months or sooner if worse    Per [place] [facility] statutes, patients with seizures are not allowed to drive until they have been seizure-free for six months.  Other recommendations include using caution when using heavy equipment or power tools. Avoid working on ladders or at heights. Take showers instead of baths.  Do not swim alone.  Ensure the water temperature is not too high on the home water heater. Do not go swimming alone. Do not lock yourself in a room alone (i.e. bathroom). When caring for infants or small children, sit down when holding, feeding, or changing them to minimize risk of injury to the child in the event you have a seizure. Maintain good sleep hygiene. Avoid alcohol.  Also recommend adequate sleep, hydration, good diet and minimize stress.   During the Seizure  - First, ensure adequate ventilation and place patients on the floor on their left side  Loosen clothing around the neck and ensure the airway is patent. If the patient is clenching the teeth, do not force the mouth open with any object as this can cause severe damage -  Remove all items from the surrounding that can be hazardous. The patient Laski be oblivious to what's happening and Boat[place] not even know what he or she is doing. If the patient is confused and wandering, either gently guide him/her away and block access to outside areas - Reassure the individual and be comforting - Call 911. In most cases, the seizure ends before EMS arrives. However, there are cases when seizures La[place] last over 3 to 5 minutes. Or the individual Mask have developed breathing difficulties or severe injuries. If a pregnant patient or a person with diabetes develops a seizure, it is prudent to call an ambulance. - Finally, if the patient does not regain full consciousness, then call EMS. Most  patients will remain confused for about 45 to 90 minutes after a seizure, so you must use judgment in calling for help. - Avoid restraints but make sure the patient is in a bed with padded side rails - Place the individual in a lateral position with the neck slightly flexed; this will help the saliva drain from the mouth and prevent the tongue from falling backward - Remove all nearby furniture and other hazards from the area - Provide verbal assurance as the individual is regaining consciousness - Provide the patient with privacy if possible - Call for help and start treatment as ordered by the caregiver   After the Seizure (Postictal Stage)  After a seizure, most patients experience confusion, fatigue, muscle pain and/or a headache. Thus, one should permit the individual to sleep. For the next few days, reassurance is essential. Being calm and helping reorient the person is also of importance.  Most seizures are painless and end spontaneously. Seizures are not harmful to others but can lead to complications such as stress on the lungs, brain and the heart. Individuals with prior lung problems Gitto develop labored breathing and respiratory distress.     No orders of the defined types were placed in  this encounter.   Meds ordered this encounter  Medications   primidone (MYSOLINE) 50 MG tablet    Sig: Take 3 tablets (150 mg total) by mouth at bedtime.    Dispense:  270 tablet    Refill:  3   primidone (MYSOLINE) 250 MG tablet    Sig: Take 1 tablet (250 mg total) by mouth daily.    Dispense:  90 tablet    Refill:  3    Return in about 6 months (around 03/24/2023).    Windell Norfolk, MD 09/21/2022, 4:33 PM  [facility] [address], [address] [place], [place] [address] [phone]

## 2022-09-21 NOTE — Patient Instructions (Signed)
Continue with Phenytoin ER 300 mg at bedtime  Increase Primidone to 250 mg in the morning and 100 mg in the evening for one week then further increase to 250 mg in the morning and 150 mg in the evening.  Continue your other medications  Continue using the walker  Follow up in 6 months or sooner if worse

## 2022-10-12 NOTE — Telephone Encounter (Signed)
This encounter was created in error - please disregard.

## 2022-10-13 DIAGNOSIS — S62613A Displaced fracture of proximal phalanx of left middle finger, initial encounter for closed fracture: Secondary | ICD-10-CM | POA: Insufficient documentation

## 2022-10-19 ENCOUNTER — Ambulatory Visit: Payer: Medicare Other

## 2022-10-24 ENCOUNTER — Other Ambulatory Visit: Payer: Self-pay

## 2022-10-24 ENCOUNTER — Encounter (HOSPITAL_COMMUNITY): Payer: Self-pay

## 2022-10-24 ENCOUNTER — Inpatient Hospital Stay (HOSPITAL_COMMUNITY): Payer: Medicare Other

## 2022-10-24 ENCOUNTER — Inpatient Hospital Stay (HOSPITAL_COMMUNITY)
Admission: EM | Admit: 2022-10-24 | Discharge: 2022-11-01 | DRG: 641 | Disposition: A | Discharge status: Skilled Nursing Facility | Payer: Medicare Other | Attending: Internal Medicine | Admitting: Internal Medicine

## 2022-10-24 DIAGNOSIS — Z85038 Personal history of other malignant neoplasm of large intestine: Secondary | ICD-10-CM

## 2022-10-24 DIAGNOSIS — Z85828 Personal history of other malignant neoplasm of skin: Secondary | ICD-10-CM | POA: Diagnosis not present

## 2022-10-24 DIAGNOSIS — E785 Hyperlipidemia, unspecified: Secondary | ICD-10-CM | POA: Diagnosis present

## 2022-10-24 DIAGNOSIS — Z923 Personal history of irradiation: Secondary | ICD-10-CM

## 2022-10-24 DIAGNOSIS — Z8349 Family history of other endocrine, nutritional and metabolic diseases: Secondary | ICD-10-CM

## 2022-10-24 DIAGNOSIS — M7989 Other specified soft tissue disorders: Secondary | ICD-10-CM | POA: Diagnosis present

## 2022-10-24 DIAGNOSIS — Z888 Allergy status to other drugs, medicaments and biological substances status: Secondary | ICD-10-CM

## 2022-10-24 DIAGNOSIS — F419 Anxiety disorder, unspecified: Secondary | ICD-10-CM | POA: Diagnosis present

## 2022-10-24 DIAGNOSIS — Z96641 Presence of right artificial hip joint: Secondary | ICD-10-CM | POA: Diagnosis present

## 2022-10-24 DIAGNOSIS — Z90710 Acquired absence of both cervix and uterus: Secondary | ICD-10-CM

## 2022-10-24 DIAGNOSIS — R531 Weakness: Secondary | ICD-10-CM | POA: Diagnosis not present

## 2022-10-24 DIAGNOSIS — Z880 Allergy status to penicillin: Secondary | ICD-10-CM

## 2022-10-24 DIAGNOSIS — Z9841 Cataract extraction status, right eye: Secondary | ICD-10-CM

## 2022-10-24 DIAGNOSIS — Z8249 Family history of ischemic heart disease and other diseases of the circulatory system: Secondary | ICD-10-CM

## 2022-10-24 DIAGNOSIS — M810 Age-related osteoporosis without current pathological fracture: Secondary | ICD-10-CM | POA: Diagnosis present

## 2022-10-24 DIAGNOSIS — Z79899 Other long term (current) drug therapy: Secondary | ICD-10-CM

## 2022-10-24 DIAGNOSIS — Z886 Allergy status to analgesic agent status: Secondary | ICD-10-CM

## 2022-10-24 DIAGNOSIS — Z794 Long term (current) use of insulin: Secondary | ICD-10-CM

## 2022-10-24 DIAGNOSIS — M79642 Pain in left hand: Secondary | ICD-10-CM

## 2022-10-24 DIAGNOSIS — K219 Gastro-esophageal reflux disease without esophagitis: Secondary | ICD-10-CM | POA: Diagnosis present

## 2022-10-24 DIAGNOSIS — Z86011 Personal history of benign neoplasm of the brain: Secondary | ICD-10-CM

## 2022-10-24 DIAGNOSIS — E1165 Type 2 diabetes mellitus with hyperglycemia: Secondary | ICD-10-CM | POA: Diagnosis present

## 2022-10-24 DIAGNOSIS — K439 Ventral hernia without obstruction or gangrene: Secondary | ICD-10-CM | POA: Diagnosis present

## 2022-10-24 DIAGNOSIS — Z7984 Long term (current) use of oral hypoglycemic drugs: Secondary | ICD-10-CM | POA: Diagnosis not present

## 2022-10-24 DIAGNOSIS — E039 Hypothyroidism, unspecified: Secondary | ICD-10-CM | POA: Diagnosis present

## 2022-10-24 DIAGNOSIS — Z79890 Hormone replacement therapy (postmenopausal): Secondary | ICD-10-CM | POA: Diagnosis not present

## 2022-10-24 DIAGNOSIS — Z8719 Personal history of other diseases of the digestive system: Secondary | ICD-10-CM

## 2022-10-24 DIAGNOSIS — I10 Essential (primary) hypertension: Secondary | ICD-10-CM | POA: Diagnosis present

## 2022-10-24 DIAGNOSIS — K580 Irritable bowel syndrome with diarrhea: Secondary | ICD-10-CM | POA: Diagnosis present

## 2022-10-24 DIAGNOSIS — Z853 Personal history of malignant neoplasm of breast: Secondary | ICD-10-CM | POA: Diagnosis not present

## 2022-10-24 DIAGNOSIS — Z889 Allergy status to unspecified drugs, medicaments and biological substances status: Secondary | ICD-10-CM

## 2022-10-24 DIAGNOSIS — Z85820 Personal history of malignant melanoma of skin: Secondary | ICD-10-CM

## 2022-10-24 DIAGNOSIS — D649 Anemia, unspecified: Secondary | ICD-10-CM | POA: Diagnosis present

## 2022-10-24 DIAGNOSIS — E871 Hypo-osmolality and hyponatremia: Secondary | ICD-10-CM | POA: Diagnosis not present

## 2022-10-24 DIAGNOSIS — Z882 Allergy status to sulfonamides status: Secondary | ICD-10-CM

## 2022-10-24 DIAGNOSIS — Z885 Allergy status to narcotic agent status: Secondary | ICD-10-CM

## 2022-10-24 DIAGNOSIS — J4489 Other specified chronic obstructive pulmonary disease: Secondary | ICD-10-CM | POA: Diagnosis present

## 2022-10-24 DIAGNOSIS — Z9861 Coronary angioplasty status: Secondary | ICD-10-CM

## 2022-10-24 DIAGNOSIS — Z881 Allergy status to other antibiotic agents status: Secondary | ICD-10-CM

## 2022-10-24 HISTORY — DX: Noninfective gastroenteritis and colitis, unspecified: K52.9

## 2022-10-24 LAB — COMPREHENSIVE METABOLIC PANEL
ALT: 14 U/L (ref 0–44)
AST: 15 U/L (ref 15–41)
Albumin: 3.2 g/dL — ABNORMAL LOW (ref 3.5–5.0)
Alkaline Phosphatase: 87 U/L (ref 38–126)
Anion gap: 9 (ref 5–15)
BUN: 13 mg/dL (ref 8–23)
CO2: 19 mmol/L — ABNORMAL LOW (ref 22–32)
Calcium: 8 mg/dL — ABNORMAL LOW (ref 8.9–10.3)
Chloride: 88 mmol/L — ABNORMAL LOW (ref 98–111)
Creatinine, Ser: 0.71 mg/dL (ref 0.44–1.00)
GFR, Estimated: >60 mL/min (ref >60)
Glucose, Bld: 201 mg/dL — ABNORMAL HIGH (ref 70–99)
Potassium: 4.3 mmol/L (ref 3.5–5.1)
Sodium: 116 mmol/L — CL (ref 135–145)
Total Bilirubin: 0.5 mg/dL (ref 0.3–1.2)
Total Protein: 6.4 g/dL — ABNORMAL LOW (ref 6.5–8.1)

## 2022-10-24 LAB — CBC WITH DIFFERENTIAL/PLATELET
Abs Immature Granulocytes: 0.04 10*3/uL (ref 0.00–0.07)
Basophils Absolute: 0 10*3/uL (ref 0.0–0.1)
Basophils Relative: 0 %
Eosinophils Absolute: 0 10*3/uL (ref 0.0–0.5)
Eosinophils Relative: 0 %
HCT: 33.5 % — ABNORMAL LOW (ref 36.0–46.0)
Hemoglobin: 11.1 g/dL — ABNORMAL LOW (ref 12.0–15.0)
Immature Granulocytes: 0 %
Lymphocytes Relative: 14 %
Lymphs Abs: 1.4 10*3/uL (ref 0.7–4.0)
MCH: 30 pg (ref 26.0–34.0)
MCHC: 33.1 g/dL (ref 30.0–36.0)
MCV: 90.5 fL (ref 80.0–100.0)
Monocytes Absolute: 0.9 10*3/uL (ref 0.1–1.0)
Monocytes Relative: 9 %
Neutro Abs: 7.4 10*3/uL (ref 1.7–7.7)
Neutrophils Relative %: 77 %
Platelets: 304 10*3/uL (ref 150–400)
RBC: 3.7 MIL/uL — ABNORMAL LOW (ref 3.87–5.11)
RDW: 14 % (ref 11.5–15.5)
WBC: 9.7 10*3/uL (ref 4.0–10.5)
nRBC: 0 % (ref 0.0–0.2)

## 2022-10-24 LAB — URINALYSIS, ROUTINE W REFLEX MICROSCOPIC
Bilirubin Urine: NEGATIVE
Glucose, UA: NEGATIVE mg/dL
Hgb urine dipstick: NEGATIVE
Ketones, ur: NEGATIVE mg/dL
Nitrite: NEGATIVE
Protein, ur: NEGATIVE mg/dL
Specific Gravity, Urine: 1.008 (ref 1.005–1.030)
pH: 5 (ref 5.0–8.0)

## 2022-10-24 LAB — TSH: TSH: 2.959 u[IU]/mL (ref 0.350–4.500)

## 2022-10-24 LAB — SODIUM
Sodium: 119 mmol/L — CL (ref 135–145)
Sodium: 122 mmol/L — ABNORMAL LOW (ref 135–145)

## 2022-10-24 LAB — GLUCOSE, CAPILLARY
Glucose-Capillary: 151 mg/dL — ABNORMAL HIGH (ref 70–99)
Glucose-Capillary: 226 mg/dL — ABNORMAL HIGH (ref 70–99)

## 2022-10-24 LAB — MRSA NEXT GEN BY PCR, NASAL: MRSA by PCR Next Gen: NOT DETECTED

## 2022-10-24 LAB — CBG MONITORING, ED: Glucose-Capillary: 187 mg/dL — ABNORMAL HIGH (ref 70–99)

## 2022-10-24 LAB — SODIUM, URINE, RANDOM: Sodium, Ur: 20 mmol/L

## 2022-10-24 LAB — HEMOGLOBIN A1C
Hgb A1c MFr Bld: 6.9 % — ABNORMAL HIGH (ref 4.8–5.6)
Mean Plasma Glucose: 151.33 mg/dL

## 2022-10-24 MED ORDER — CHOLESTYRAMINE 4 G PO PACK
4.0000 g | PACK | Freq: Two times a day (BID) | ORAL | Status: DC
  Filled 2022-10-24: qty 1

## 2022-10-24 MED ORDER — ACETAMINOPHEN 325 MG PO TABS
650.0000 mg | ORAL_TABLET | Freq: Four times a day (QID) | ORAL | Status: DC | PRN
  Filled 2022-10-24: qty 2

## 2022-10-24 MED ORDER — ALPRAZOLAM 0.25 MG PO TABS
0.1250 mg | ORAL_TABLET | Freq: Two times a day (BID) | ORAL | Status: DC | PRN
  Administered 2022-10-25 – 2022-10-31 (×9): 0.125 mg via ORAL
  Filled 2022-10-24 (×10): qty 1

## 2022-10-24 MED ORDER — FENTANYL CITRATE PF 50 MCG/ML IJ SOSY
50.0000 ug | PREFILLED_SYRINGE | Freq: Once | INTRAMUSCULAR | Status: AC
  Administered 2022-10-24: 50 ug via INTRAVENOUS
  Filled 2022-10-24: qty 1

## 2022-10-24 MED ORDER — ORAL CARE MOUTH RINSE: 15.0000 mL | OROMUCOSAL | Status: DC | PRN

## 2022-10-24 MED ORDER — CHOLESTYRAMINE LIGHT 4 G PO PACK
4.0000 g | PACK | Freq: Two times a day (BID) | ORAL | Status: DC
  Administered 2022-10-24 – 2022-11-01 (×15): 4 g via ORAL
  Filled 2022-10-24 (×15): qty 1

## 2022-10-24 MED ORDER — ONDANSETRON HCL 4 MG/2ML IJ SOLN: 4.0000 mg | Freq: Four times a day (QID) | INTRAMUSCULAR | Status: DC | PRN

## 2022-10-24 MED ORDER — TRAMADOL HCL 50 MG PO TABS
50.0000 mg | ORAL_TABLET | Freq: Once | ORAL | Status: AC
  Administered 2022-10-24: 50 mg via ORAL
  Filled 2022-10-24: qty 1

## 2022-10-24 MED ORDER — VERAPAMIL HCL ER 200 MG PO CP24: 200.0000 mg | ORAL_CAPSULE | Freq: Every day | ORAL | Status: DC

## 2022-10-24 MED ORDER — ACETAMINOPHEN 650 MG RE SUPP: 650.0000 mg | Freq: Four times a day (QID) | RECTAL | Status: DC | PRN

## 2022-10-24 MED ORDER — PANTOPRAZOLE SODIUM 40 MG PO TBEC
40.0000 mg | DELAYED_RELEASE_TABLET | Freq: Every day | ORAL | Status: DC
  Administered 2022-10-24 – 2022-11-01 (×9): 40 mg via ORAL
  Filled 2022-10-24 (×9): qty 1

## 2022-10-24 MED ORDER — PHENYTOIN SODIUM EXTENDED 100 MG PO CAPS
300.0000 mg | ORAL_CAPSULE | Freq: Every day | ORAL | Status: DC
  Administered 2022-10-25 – 2022-11-01 (×8): 300 mg via ORAL
  Filled 2022-10-24 (×8): qty 3

## 2022-10-24 MED ORDER — PRIMIDONE 250 MG PO TABS
250.0000 mg | ORAL_TABLET | Freq: Every day | ORAL | Status: DC
  Administered 2022-10-25 – 2022-11-01 (×8): 250 mg via ORAL
  Filled 2022-10-24 (×9): qty 1

## 2022-10-24 MED ORDER — POLYETHYLENE GLYCOL 3350 17 G PO PACK: 17.0000 g | PACK | Freq: Every day | ORAL | Status: DC | PRN

## 2022-10-24 MED ORDER — INSULIN ASPART 100 UNIT/ML IJ SOLN
0.0000 [IU] | Freq: Three times a day (TID) | INTRAMUSCULAR | Status: DC
  Administered 2022-10-25 – 2022-10-26 (×5): 2 [IU] via SUBCUTANEOUS
  Administered 2022-10-27 (×2): 1 [IU] via SUBCUTANEOUS
  Administered 2022-10-28: 3 [IU] via SUBCUTANEOUS
  Filled 2022-10-24: qty 0.09

## 2022-10-24 MED ORDER — SODIUM CHLORIDE 0.9 % IV SOLN: INTRAVENOUS | Status: DC

## 2022-10-24 MED ORDER — VERAPAMIL HCL ER 180 MG PO TBCR
180.0000 mg | EXTENDED_RELEASE_TABLET | Freq: Every day | ORAL | Status: DC
  Administered 2022-10-24 – 2022-11-01 (×9): 180 mg via ORAL
  Filled 2022-10-24 (×9): qty 1

## 2022-10-24 MED ORDER — PRIMIDONE 50 MG PO TABS
150.0000 mg | ORAL_TABLET | Freq: Every day | ORAL | Status: DC
  Administered 2022-10-24 – 2022-10-31 (×8): 150 mg via ORAL
  Filled 2022-10-24 (×8): qty 3

## 2022-10-24 MED ORDER — INSULIN ASPART 100 UNIT/ML IJ SOLN
0.0000 [IU] | Freq: Every day | INTRAMUSCULAR | Status: DC
  Administered 2022-10-24: 2 [IU] via SUBCUTANEOUS
  Filled 2022-10-24: qty 0.05

## 2022-10-24 MED ORDER — LACTATED RINGERS IV BOLUS
1000.0000 mL | Freq: Once | INTRAVENOUS | Status: AC
  Administered 2022-10-24: 1000 mL via INTRAVENOUS

## 2022-10-24 MED ORDER — ONDANSETRON HCL 4 MG PO TABS: 4.0000 mg | ORAL_TABLET | Freq: Four times a day (QID) | ORAL | Status: DC | PRN

## 2022-10-24 MED ORDER — LEVOTHYROXINE SODIUM 75 MCG PO TABS
75.0000 ug | ORAL_TABLET | Freq: Every day | ORAL | Status: DC
  Administered 2022-10-25 – 2022-11-01 (×8): 75 ug via ORAL
  Filled 2022-10-24 (×8): qty 1

## 2022-10-24 MED ORDER — ENOXAPARIN SODIUM 40 MG/0.4ML IJ SOSY
40.0000 mg | PREFILLED_SYRINGE | Freq: Every day | INTRAMUSCULAR | Status: DC
  Administered 2022-10-24 – 2022-10-31 (×8): 40 mg via SUBCUTANEOUS
  Filled 2022-10-24 (×8): qty 0.4

## 2022-10-24 MED ORDER — CHLORHEXIDINE GLUCONATE CLOTH 2 % EX PADS
6.0000 | MEDICATED_PAD | Freq: Every day | CUTANEOUS | Status: DC
  Administered 2022-10-24: 6 via TOPICAL

## 2022-10-24 MED ORDER — TRAMADOL HCL 50 MG PO TABS
50.0000 mg | ORAL_TABLET | Freq: Three times a day (TID) | ORAL | Status: DC | PRN
  Administered 2022-10-24 – 2022-10-26 (×5): 50 mg via ORAL
  Filled 2022-10-24 (×6): qty 1

## 2022-10-24 MED ORDER — INSULIN GLARGINE-YFGN 100 UNIT/ML ~~LOC~~ SOLN
8.0000 [IU] | Freq: Every day | SUBCUTANEOUS | Status: DC
  Administered 2022-10-24 – 2022-10-28 (×5): 8 [IU] via SUBCUTANEOUS
  Filled 2022-10-24 (×5): qty 0.08

## 2022-10-24 MED ORDER — LOPERAMIDE HCL 2 MG PO CAPS
2.0000 mg | ORAL_CAPSULE | ORAL | Status: DC | PRN
  Administered 2022-10-24: 2 mg via ORAL
  Filled 2022-10-24: qty 1

## 2022-10-24 NOTE — ED Triage Notes (Signed)
BIBA from home for left hand swelling, generalized weakness. Pt family states they cannot take care of her anymore and need help to find placement in a SNF.

## 2022-10-24 NOTE — ED Notes (Signed)
ED TO INPATIENT HANDOFF REPORT  Name/Age/Gender [name] 89 y.o. female  Code Status    Code Status Orders  (From admission, onward)           Start     Ordered   10/24/22 1414  Full code  Continuous       Question:  By:  Answer:  Consent: discussion documented in EHR   10/24/22 1415           Code Status History     Date Active Date Inactive Code Status Order ID Comments User Context   01/24/2022 0245 01/29/2022 1730 Full Code [id number]  Briscoe Deutscher, MD ED   12/21/2021 1446 12/22/2021 2154 Full Code [id number]  Belva Agee, MD ED   04/03/2021 1415 04/08/2021 1737 Full Code [id number]  Darlin Drop, DO ED   08/17/2020 0338 08/22/2020 2001 Full Code [id number]  Eduard Clos, MD ED   06/04/2020 1322 06/11/2020 0538 Full Code [id number]  Vassie Loll, MD ED   04/01/2020 0654 04/02/2020 1735 Full Code [id number]  Marinda Elk, MD ED   03/12/2020 2239 03/21/2020 1909 Full Code [id number]  Gaynelle Adu, MD ED   03/06/2020 1451 03/12/2020 1720 Full Code [id number]  Andria Meuse, MD Inpatient   10/26/2018 0153 10/29/2018 1938 Full Code [id number]  Hugelmeyer, [place], DO Inpatient   07/19/2014 1107 07/20/2014 0335 Full Code [id number]  Oley Balm, MD HOV   10/18/2013 1942 10/27/2013 2023 Full Code [id number]  Karn Cassis, MD Inpatient   10/09/2013 2217 10/18/2013 1942 Full Code [id number]  Rama, Maryruth Bun, MD Inpatient       Home/SNF/Other Home  Chief Complaint Hyponatremia [E87.1]  Level of Care/Admitting Diagnosis ED Disposition     ED Disposition  Admit   Condition  --   Comment  Hospital Area: [facility] [[id number]]  Level of Care: Stepdown [14]  Admit to SDU based on following criteria: Severe physiological/psychological symptoms:  Any diagnosis requiring assessment & intervention at least every 4 hours on an ongoing basis to obtain desired patient outcomes including stability and rehabilitation  [facility] admit patient to [facility] or [facility] if equivalent level of care is available:: No  Covid Evaluation: Asymptomatic - no recent exposure (last 10 days) testing not required  Diagnosis: Hyponatremia [254270]  Admitting Physician: Leatha Gilding [[id number]]  Attending Physician: Leatha Gilding [phone]  Certification:: I certify this patient will need inpatient services for at least 2 midnights  Estimated Length of Stay: 3          Medical History Past Medical History:  Diagnosis Date   Anemia    Anxiety    Asthma    Breast cancer (HCC) 2014   right breast   Cancer of right breast (HCC) 12/26/2012   right breast 12:00 o'clock, DCIS   Carotid artery disease (HCC)    Carpal tunnel syndrome, bilateral    Chronic bronchitis (HCC)    Chronic cough    Chronic facial pain    Chronic foot pain    Colon cancer (HCC) dx'd 11/2019   Complication of anesthesia    Sore jaw; could not chew or move mouth, prolonged sedation   Convulsions/seizures (HCC) 10/16/2014   Diabetes mellitus    type 2 niddm x 20 years   Dyslipidemia    Ejection fraction    Gait abnormality 12/04/2019   GERD (gastroesophageal reflux disease)    Hammer toe    bilateral   History of  colonic polyps    History of meningioma    HTN (hypertension)    Hx of radiation therapy 03/07/13- 03/29/13   right breast 4250 cGy 17 sessions   Hyperlipidemia    Hypokalemia    Hyponatremia    Hypothyroidism    IBS (irritable bowel syndrome)    Melanoma (HCC)    Metatarsal bone fracture 2014   Multiple drug allergies    Nausea & vomiting 03/12/2020   Nontoxic thyroid nodule    Obesity    Osteoarthritis    Osteoporosis    Palpitations    Personal history of radiation therapy 2014   Seasonal allergies    Skin cancer    Syncope    Tremor, essential 08/18/2016   Vitamin B12 deficiency    Vitamin D deficiency     Allergies Allergies  Allergen Reactions   Bystolic [Nebivolol Hcl] Other (See Comments)    "extreme weakness, heaviness in  legs & arms, swelling in legs/arms/face, swollen abdomen, pain in bladder, feet pain, soreness in chest"   Carbamazepine Other (See Comments)    Blood poisoning   Cholestyramine Itching, Nausea And Vomiting, Rash and Other (See Comments)    "itching rash on stomach, bloated, nausea, vomiting, sleeplessness, extreme pain in arms"  Patient is taking this in 2022.   Morphine Other (See Comments)    Feels morbid, weak, still in pain   Niacin Palpitations    Fast heart beat Other reaction(s): Unknown   Niaspan [Niacin Er] Palpitations and Other (See Comments)    "fast heart beat, high blood pressure"   Norvasc [Amlodipine Besylate] Other (See Comments)    "extreme fluid retention/pain)   Optivar [Azelastine Hcl] Photosensitivity   Repaglinide Hives   Sular [Nisoldipine Er] Other (See Comments)    "severe headaches, swelling eyes, hands, feet, shortness of breath, weak, flushed face, brain boiling, fluid retention, high blood sugar, nervous, heart fast beating"   Telmisartan Other (See Comments)    "headache, difficulty urinating, high blood sugar, fluid retention"   Amoxicillin-Pot Clavulanate Rash    Tolerates Unasyn (>10 doses)   Cefdinir Swelling    Vaginal irritation, breathing,    Ciprofloxacin Hcl Hives   Clonidine Other (See Comments)    Dry mouth, fluid retention   Clonidine Hydrochloride Other (See Comments)    Dry mouth, fluid retention   Codeine Nausea And Vomiting   Ezetimibe Other (See Comments)    Made weak   Hydroxychloroquine Other (See Comments)    Low platlets   Naproxen Other (See Comments)    Shrinks bladder   Sulfa Antibiotics Rash   Ziac [Bisoprolol-Hydrochlorothiazide] Other (See Comments)    "stopped urination"   Amlodipine Besy-Benazepril Hcl Cough   Azelastine Other (See Comments)    Unknown reaction - Per MAR   Elavil [Amitriptyline] Other (See Comments)    Gave Pt nightmares   Empagliflozin Other (See Comments)    "Caused yeast infection, slowed my  urine"   Hydralazine Other (See Comments)    "does not reduce high blood pressure, pain in arm, high pressure, felt like I was on verge of heart attack, really weak"   Iodine I 131 Tositumomab Other (See Comments)    Unknown reaction - Per MAR   Kenalog [Triamcinolone] Diarrhea    Per MAR   Keppra [Levetiracetam] Other (See Comments)    Shaking   Lamotrigine Itching and Other (See Comments)   Misc. Sulfonamide Containing Compounds    Pregabalin Swelling and Other (See Comments)    Weight  gain   Pseudoephedrine Other (See Comments)    Unknown reaction   Pseudoephedrine-Guaifenesin Er Other (See Comments)    Unknown reaction - Per MAR   Risedronate     Unknown reaction - Per MAR   Ru-Hist D [Brompheniramine-Phenylephrine] Other (See Comments)    Unknown reaction - Per MAR   Sumatriptan Other (See Comments)   Topiramate Other (See Comments)    Dry eyes   Ace Inhibitors Other (See Comments)    unknown   Actonel [Risedronate Sodium] Other (See Comments)    unknown   Aspirin Other (See Comments)    Unknown reaction   Atacand [Candesartan] Other (See Comments)    Unknown reaction - MAR   Bextra [Valdecoxib] Other (See Comments)    Unknown reaction - Per MAR   Bisoprolol-Hydrochlorothiazide Other (See Comments)    Unknown reaction - Per MAR   Cefadroxil Other (See Comments)    Unknown reaction   Celecoxib Rash   Hydrocodone Other (See Comments)    unknown   Hydrocodone-Acetaminophen Other (See Comments)    unknown   Iodinated Contrast Media Rash and Other (See Comments)    "All over" rash   Meloxicam Other (See Comments)    unknown   Methylprednisolone Sodium Succinate Other (See Comments)    unknown   Nabumetone Other (See Comments)    Unknown reaction   Penicillins Other (See Comments)    unknown   Pseudoephedrine-Guaifenesin Other (See Comments)    unknown   Risedronate Sodium Other (See Comments)    unknown   Rofecoxib Other (See Comments)    Unknown reaction -  MAR Other reaction(s): Unknown   Ru-Tuss [Chlorphen-Pse-Atrop-Hyos-Scop] Other (See Comments)    unknown   Sulfonamide Derivatives Other (See Comments)    unknown   Sulphur [Elemental Sulfur] Other (See Comments)    unknown   Telithromycin Other (See Comments)    unknown   Terfenadine Other (See Comments)    Unknown reaction - Per MAR   Trandolapril-Verapamil Hcl Er Other (See Comments)    Headache, difficulty urinating, high blood sugar, fluid retention  Pt is taking Tarka (trandolapril-verapamil) currently, but requests the medication stay in her allergy list   Trandolapril-Verapamil Hcl Er Other (See Comments)    Headache, difficulty urinating, high blood sugar, fluid retention  Pt is taking Tarka (trandolapril-verapamil) currently, but requests the medication stay in her allergy list   Valium [Diazepam] Other (See Comments)    Makes her mean and hyper    IV Location/Drains/Wounds Patient Lines/Drains/Airways Status     Active Line/Drains/Airways     Name Placement date Placement time Site Days   Peripheral IV 10/24/22 20 G Anterior;Distal;Right Forearm 10/24/22  1246  Forearm  less than 1   External Urinary Catheter 10/24/22  1135  --  less than 1            Labs/Imaging Results for orders placed or performed during the hospital encounter of 10/24/22 (from the past 48 hour(s))  CBC with Differential     Status: Abnormal   Collection Time: 10/24/22 12:33 PM  Result Value Ref Range   WBC 9.7 4.0 - 10.5 K/uL   RBC 3.70 (L) 3.87 - 5.11 MIL/uL   Hemoglobin 11.1 (L) 12.0 - 15.0 g/dL   HCT 09.8 (L) 11.9 - 14.7 %   MCV 90.5 80.0 - 100.0 fL   MCH 30.0 26.0 - 34.0 pg   MCHC 33.1 30.0 - 36.0 g/dL   RDW 82.9 56.2 - 13.0 %  Platelets 304 150 - 400 K/uL   nRBC 0.0 0.0 - 0.2 %   Neutrophils Relative % 77 %   Neutro Abs 7.4 1.7 - 7.7 K/uL   Lymphocytes Relative 14 %   Lymphs Abs 1.4 0.7 - 4.0 K/uL   Monocytes Relative 9 %   Monocytes Absolute 0.9 0.1 - 1.0 K/uL    Eosinophils Relative 0 %   Eosinophils Absolute 0.0 0.0 - 0.5 K/uL   Basophils Relative 0 %   Basophils Absolute 0.0 0.0 - 0.1 K/uL   Immature Granulocytes 0 %   Abs Immature Granulocytes 0.04 0.00 - 0.07 K/uL    Comment: Performed at [facility], [address], [place], [place] [address]  Comprehensive metabolic panel     Status: Abnormal   Collection Time: 10/24/22 12:33 PM  Result Value Ref Range   Sodium 116 (LL) 135 - 145 mmol/L    Comment: CRITICAL RESULT CALLED TO, READ BACK BY AND VERIFIED WITH COOK, J @ 1325 ON 10/24/2022 BY Deedra Ehrich, K    Potassium 4.3 3.5 - 5.1 mmol/L   Chloride 88 (L) 98 - 111 mmol/L   CO2 19 (L) 22 - 32 mmol/L   Glucose, Bld 201 (H) 70 - 99 mg/dL    Comment: Glucose reference range applies only to samples taken after fasting for at least 8 hours.   BUN 13 8 - 23 mg/dL   Creatinine, Ser 6.04 0.44 - 1.00 mg/dL   Calcium 8.0 (L) 8.9 - 10.3 mg/dL   Total Protein 6.4 (L) 6.5 - 8.1 g/dL   Albumin 3.2 (L) 3.5 - 5.0 g/dL   AST 15 15 - 41 U/L   ALT 14 0 - 44 U/L   Alkaline Phosphatase 87 38 - 126 U/L   Total Bilirubin 0.5 0.3 - 1.2 mg/dL   GFR, Estimated >54 >09 mL/min    Comment: (NOTE) Calculated using the CKD-EPI Creatinine Equation (2021)    Anion gap 9 5 - 15    Comment: Performed at [facility], [address], [place], [place] [address]  POC CBG, ED     Status: Abnormal   Collection Time: 10/24/22  1:02 PM  Result Value Ref Range   Glucose-Capillary 187 (H) 70 - 99 mg/dL    Comment: Glucose reference range applies only to samples taken after fasting for at least 8 hours.  TSH     Status: None   Collection Time: 10/24/22  1:20 PM  Result Value Ref Range   TSH 2.959 0.350 - 4.500 uIU/mL    Comment: Performed by a 3rd Generation assay with a functional sensitivity of <=0.01 uIU/mL. Performed at [facility], [address], [place], [place] [address]    *Note: Due to a large number of  results and/or encounters for the requested time period, some results have not been displayed. A complete set of results can be found in Results Review.   No results found.  Pending Labs Unresulted Labs (From admission, onward)     Start     Ordered   10/24/22 1700  Sodium  Now then every 6 hours,   R (with TIMED occurrences)      10/24/22 1430   10/24/22 1431  Hemoglobin A1c  Once,   R       Comments: To assess prior glycemic control    10/24/22 1430   10/24/22 1426  Sodium, urine, random  Once,   R  10/24/22 1425   10/24/22 1426  Osmolality, urine  Once,   R        10/24/22 1425   10/24/22 1232  Urinalysis, Routine w reflex microscopic -Urine, Clean Catch  Once,   URGENT       Question:  Specimen Source  Answer:  Urine, Clean Catch   10/24/22 1239   Signed and Held  Basic metabolic panel  Once,   R        Signed and Held   Signed and Held  Urinalysis, Routine w reflex microscopic -Urine, Clean Catch  Once,   R       Question:  Specimen Source  Answer:  Urine, Clean Catch   Signed and Held            Vitals/Pain Today's Vitals   10/24/22 1230 10/24/22 1300 10/24/22 1330 10/24/22 1400  BP: 126/79 135/69 131/63 122/67  Pulse: 74 74 72 73  Resp: 16 18 14 16   Temp:      TempSrc:      SpO2: 97% 95% 94% 94%    Isolation Precautions No active isolations  Medications Medications  insulin glargine-yfgn (SEMGLEE) injection 8 Units (has no administration in time range)  insulin aspart (novoLOG) injection 0-9 Units (has no administration in time range)  insulin aspart (novoLOG) injection 0-5 Units (has no administration in time range)  lactated ringers bolus 1,000 mL (0 mLs Intravenous Stopped 10/24/22 1356)  fentaNYL (SUBLIMAZE) injection 50 mcg (50 mcg Intravenous Given 10/24/22 1323)    Mobility walks with person assist

## 2022-10-24 NOTE — H&P (Signed)
History and Physical    Durinda Buzzelli Glanzer ZOX:[id number] DOB: 1929/06/08 DOA: 10/24/2022  I have briefly reviewed the patient's prior medical records in [facility]  PCP: Verlon Au, MD  Patient coming from: home  Chief Complaint: weakness, diarrhea  HPI: [name] is a 89 y.o. female with medical history significant of insulin-dependent diabetes mellitus, chronic hyponatremia, hypothyroidism, depression, who comes into the hospital with generalized weakness as well as significant diarrhea.  Symptoms have been present for the past couple of days.  Yesterday she was so weak that slept almost all day, and had little to no p.o. intake.  She denies any abdominal pain, nausea or vomiting.  She denies any fever or chills.  Of note, she recently had carpal tunnel surgery last Tuesday, about 4 days ago.  She has not felt herself since.  She also is complaining of a ventral hernia that she is notices protruding upon standing up.  Denies any pain with it.  She is also been having intermittent left lower extremity swelling in her calf/ankle area, coming and going over the last 3 months.  ED Course: In the emergency room she is afebrile, normotensive, satting well on room air.  Blood work pertinent for sodium of 116, chloride 88, bicarb 19 and glucose of 201.  Hemoglobin is 11.1.  White count is normal at 9.7.  Patient is too weak to get up.  Given weakness, hyponatremia, we are asked to admit to the hospital.  Review of Systems: All systems reviewed, and apart from HPI, all negative  Past Medical History:  Diagnosis Date   Anemia    Anxiety    Asthma    Breast cancer (HCC) 2014   right breast   Cancer of right breast (HCC) 12/26/2012   right breast 12:00 o'clock, DCIS   Carotid artery disease (HCC)    Carpal tunnel syndrome, bilateral    Chronic bronchitis (HCC)    Chronic cough    Chronic facial pain    Chronic foot pain    Colon cancer (HCC) dx'd 11/2019   Complication of anesthesia     Sore jaw; could not chew or move mouth, prolonged sedation   Convulsions/seizures (HCC) 10/16/2014   Diabetes mellitus    type 2 niddm x 20 years   Dyslipidemia    Ejection fraction    Gait abnormality 12/04/2019   GERD (gastroesophageal reflux disease)    Hammer toe    bilateral   History of colonic polyps    History of meningioma    HTN (hypertension)    Hx of radiation therapy 03/07/13- 03/29/13   right breast 4250 cGy 17 sessions   Hyperlipidemia    Hypokalemia    Hyponatremia    Hypothyroidism    IBS (irritable bowel syndrome)    Melanoma (HCC)    Metatarsal bone fracture 2014   Multiple drug allergies    Nausea & vomiting 03/12/2020   Nontoxic thyroid nodule    Obesity    Osteoarthritis    Osteoporosis    Palpitations    Personal history of radiation therapy 2014   Seasonal allergies    Skin cancer    Syncope    Tremor, essential 08/18/2016   Vitamin B12 deficiency    Vitamin D deficiency     Past Surgical History:  Procedure Laterality Date   ABDOMINAL HYSTERECTOMY     BIOPSY  11/07/2019   Procedure: BIOPSY;  Surgeon: Bernette Redbird, MD;  Location: [facility];  Service: Endoscopy;;  BRAIN SURGERY     BREAST BIOPSY Right 01/24/2013   Procedure: RE-EXCICION OF BREAST CANCER, ANTERIOR MARGINS;  Surgeon: Mariella Saa, MD;  Location: [facility];  Service: General;  Laterality: Right;   BREAST LUMPECTOMY Right 2014   BREAST LUMPECTOMY WITH NEEDLE LOCALIZATION Right 01/17/2013   Procedure: BREAST LUMPECTOMY WITH NEEDLE LOCALIZATION;  Surgeon: Mariella Saa, MD;  Location: [facility];  Service: General;  Laterality: Right;   BUNIONECTOMY Bilateral    CATARACT EXTRACTION W/ INTRAOCULAR LENS IMPLANT Right    CHOLECYSTECTOMY     COLONOSCOPY     COLONOSCOPY WITH PROPOFOL N/A 11/07/2019   Procedure: COLONOSCOPY WITH PROPOFOL;  Surgeon: Bernette Redbird, MD;  Location: [facility];  Service: Endoscopy;  Laterality: N/A;   CRANIOTOMY Right 10/18/2013   Procedure:  CRANIOTOMY TUMOR EXCISION;  Surgeon: Karn Cassis, MD;  Location: [facility];  Service: Neurosurgery;  Laterality: Right;   ESOPHAGOGASTRODUODENOSCOPY (EGD) WITH PROPOFOL N/A 11/07/2019   Procedure: ESOPHAGOGASTRODUODENOSCOPY (EGD) WITH PROPOFOL;  Surgeon: Bernette Redbird, MD;  Location: [facility];  Service: Endoscopy;  Laterality: N/A;   EYE SURGERY     HERNIA REPAIR     IR THORACENTESIS ASP PLEURAL SPACE W/IMG GUIDE  04/01/2020   KNEE ARTHROSCOPY Bilateral    LAPAROSCOPIC RIGHT HEMI COLECTOMY Right 03/06/2020   Procedure: LAPAROSCOPIC RIGHT HEMI COLECTOMY WITH TAP BLOCK AND LYSIS OF ADHESIONS;  Surgeon: Andria Meuse, MD;  Location: [facility];  Service: General;  Laterality: Right;   POLYPECTOMY     small adenomatous   TOTAL HIP ARTHROPLASTY Right 06/05/2020   Procedure: ARTHROPLASTY  ANTERIOR APPROACH. RIGHT HIP;  Surgeon: Samson Frederic, MD;  Location: [facility];  Service: Orthopedics;  Laterality: Right;   ULNAR TUNNEL RELEASE       reports that she has never smoked. She has never used smokeless tobacco. She reports that she does not drink alcohol and does not use drugs.  Allergies  Allergen Reactions   Bystolic [Nebivolol Hcl] Other (See Comments)    "extreme weakness, heaviness in legs & arms, swelling in legs/arms/face, swollen abdomen, pain in bladder, feet pain, soreness in chest"   Carbamazepine Other (See Comments)    Blood poisoning   Cholestyramine Itching, Nausea And Vomiting, Rash and Other (See Comments)    "itching rash on stomach, bloated, nausea, vomiting, sleeplessness, extreme pain in arms"  Patient is taking this in 2022.   Morphine Other (See Comments)    Feels morbid, weak, still in pain   Niacin Palpitations    Fast heart beat Other reaction(s): Unknown   Niaspan [Niacin Er] Palpitations and Other (See Comments)    "fast heart beat, high blood pressure"   Norvasc [Amlodipine Besylate] Other (See Comments)    "extreme fluid retention/pain)   Optivar  [Azelastine Hcl] Photosensitivity   Repaglinide Hives   Sular [Nisoldipine Er] Other (See Comments)    "severe headaches, swelling eyes, hands, feet, shortness of breath, weak, flushed face, brain boiling, fluid retention, high blood sugar, nervous, heart fast beating"   Telmisartan Other (See Comments)    "headache, difficulty urinating, high blood sugar, fluid retention"   Amoxicillin-Pot Clavulanate Rash    Tolerates Unasyn (>10 doses)   Cefdinir Swelling    Vaginal irritation, breathing,    Ciprofloxacin Hcl Hives   Clonidine Other (See Comments)    Dry mouth, fluid retention   Clonidine Hydrochloride Other (See Comments)    Dry mouth, fluid retention   Codeine Nausea And Vomiting   Ezetimibe Other (See Comments)  Made weak   Hydroxychloroquine Other (See Comments)    Low platlets   Naproxen Other (See Comments)    Shrinks bladder   Sulfa Antibiotics Rash   Ziac [Bisoprolol-Hydrochlorothiazide] Other (See Comments)    "stopped urination"   Amlodipine Besy-Benazepril Hcl Cough   Azelastine Other (See Comments)    Unknown reaction - Per MAR   Elavil [Amitriptyline] Other (See Comments)    Gave Pt nightmares   Empagliflozin Other (See Comments)    "Caused yeast infection, slowed my urine"   Hydralazine Other (See Comments)    "does not reduce high blood pressure, pain in arm, high pressure, felt like I was on verge of heart attack, really weak"   Iodine I 131 Tositumomab Other (See Comments)    Unknown reaction - Per MAR   Kenalog [Triamcinolone] Diarrhea    Per MAR   Keppra [Levetiracetam] Other (See Comments)    Shaking   Lamotrigine Itching and Other (See Comments)   Misc. Sulfonamide Containing Compounds    Pregabalin Swelling and Other (See Comments)    Weight gain   Pseudoephedrine Other (See Comments)    Unknown reaction   Pseudoephedrine-Guaifenesin Er Other (See Comments)    Unknown reaction - Per MAR   Risedronate     Unknown reaction - Per MAR   Ru-Hist  D [Brompheniramine-Phenylephrine] Other (See Comments)    Unknown reaction - Per MAR   Sumatriptan Other (See Comments)   Topiramate Other (See Comments)    Dry eyes   Ace Inhibitors Other (See Comments)    unknown   Actonel [Risedronate Sodium] Other (See Comments)    unknown   Aspirin Other (See Comments)    Unknown reaction   Atacand [Candesartan] Other (See Comments)    Unknown reaction - MAR   Bextra [Valdecoxib] Other (See Comments)    Unknown reaction - Per MAR   Bisoprolol-Hydrochlorothiazide Other (See Comments)    Unknown reaction - Per MAR   Cefadroxil Other (See Comments)    Unknown reaction   Celecoxib Rash   Hydrocodone Other (See Comments)    unknown   Hydrocodone-Acetaminophen Other (See Comments)    unknown   Iodinated Contrast Media Rash and Other (See Comments)    "All over" rash   Meloxicam Other (See Comments)    unknown   Methylprednisolone Sodium Succinate Other (See Comments)    unknown   Nabumetone Other (See Comments)    Unknown reaction   Penicillins Other (See Comments)    unknown   Pseudoephedrine-Guaifenesin Other (See Comments)    unknown   Risedronate Sodium Other (See Comments)    unknown   Rofecoxib Other (See Comments)    Unknown reaction - MAR Other reaction(s): Unknown   Ru-Tuss [Chlorphen-Pse-Atrop-Hyos-Scop] Other (See Comments)    unknown   Sulfonamide Derivatives Other (See Comments)    unknown   Sulphur [Elemental Sulfur] Other (See Comments)    unknown   Telithromycin Other (See Comments)    unknown   Terfenadine Other (See Comments)    Unknown reaction - Per MAR   Trandolapril-Verapamil Hcl Er Other (See Comments)    Headache, difficulty urinating, high blood sugar, fluid retention  Pt is taking Tarka (trandolapril-verapamil) currently, but requests the medication stay in her allergy list   Trandolapril-Verapamil Hcl Er Other (See Comments)    Headache, difficulty urinating, high blood sugar, fluid retention  Pt is  taking Tarka (trandolapril-verapamil) currently, but requests the medication stay in her allergy list   Valium [Diazepam] Other (  See Comments)    Makes her mean and hyper    Family History  Problem Relation Age of Onset   Heart disease Mother    Osteoporosis Mother    Diabetes Father    Pancreatic cancer Father    Bone cancer Sister    Rectal cancer Sister    Thyroid disease Sister        benign goiter resected   Prostate cancer Brother    Colon cancer Brother    Colon cancer Other     Prior to Admission medications   Medication Sig Start Date End Date Taking? Authorizing Provider  acetaminophen (TYLENOL) 500 MG tablet Take 2 tablets (1,000 mg total) by mouth 3 (three) times daily. Patient taking differently: Take 1,000 mg by mouth every 8 (eight) hours as needed for moderate pain. 08/21/20   Joseph Art, DO  ALPRAZolam Prudy Feeler) 0.25 MG tablet Take 1 tablet (0.25 mg total) by mouth 2 (two) times daily as needed for anxiety. 01/29/22   Ghimire, Werner Lean, MD  Biotin 5000 MCG CAPS Take 5,000 mcg by mouth daily.    [provider]  Blood Glucose Monitoring Suppl (PRODIGY VOICE BLOOD GLUCOSE) w/Device KIT Use to check blood sugar 1 time per day. Patient not taking: Reported on 09/21/2022 10/18/15   Romero Belling, MD  capsicum (ZOSTRIX) 0.075 % topical cream Apply 1 Application topically 4 (four) times daily as needed (pain). 08/04/21   [provider]  Cholecalciferol (VITAMIN D-3) 125 MCG (5000 UT) TABS Take 5,000 Units by mouth daily.    [provider]  cholestyramine (QUESTRAN) 4 g packet Take 4 g by mouth 2 (two) times daily.    [provider]  cyanocobalamin (,VITAMIN B-12,) 1000 MCG/ML injection Inject 1,000 mcg into the muscle every 21 ( twenty-one) days.    [provider]  diclofenac Sodium (VOLTAREN) 1 % GEL Apply 2 g topically 4 (four) times daily as needed (for knee and back pain). 06/01/19   [provider]  diphenhydrAMINE  (BENADRYL) 25 mg capsule Take 25 mg by mouth every 8 (eight) hours as needed for allergies or sleep.    [provider]  glucose blood (GLUCOSE METER TEST) test strip See admin instructions.    [provider]  Glucose Blood (PRODIGY VOICE BLOOD GLUCOSE VI)     [provider]  HYDROcodone-acetaminophen (NORCO/VICODIN) 5-325 MG tablet Take 1 tablet by mouth 3 (three) times daily as needed for moderate pain or severe pain. 10/20/22   [provider]  insulin aspart (NOVOLOG) 100 UNIT/ML FlexPen Inject 2-12 Units into the skin See admin instructions. Injects 2-10 units of insulin under the skin 3 times a day per sliding scale: CBG 0-150: 2 units; CBG 151-200: 4 units; CBG 201-250: 6 units; CBG 251-300: 8 units; CBG 301-350: 10 units; CBG 351-400: 12 units; CBG >400: 12 units and notify the MD/NP Patient taking differently: Inject 2-12 Units into the skin See admin instructions. Inject 2-10 units into the skin 3 times a day per sliding scale: CBG 0-150: 2 units; CBG 151-200: 4 units; CBG 201-250: 6 units; CBG 251-300: 8 units; CBG 301-350: 10 units; CBG 351-400: 12 units; CBG >400: 12 units and notify the MD/NP 07/08/20   Coletta Memos, Amy E, NP  insulin glargine (LANTUS SOLOSTAR) 100 UNIT/ML Solostar Pen Inject 10 Units into the skin at bedtime. Patient taking differently: Inject 10 Units into the skin See admin instructions. Inject 10 units into the skin every afternoon- when eating 08/21/20  Marlin Canary U, DO  Insulin Pen Needle 32G X 4 MM MISC Used to inject insulin 3x daily Patient taking differently: 1 each by Other route See admin instructions. Used to inject insulin 3x daily 11/19/16   Romero Belling, MD  levothyroxine (SYNTHROID) 75 MCG tablet Take 1 tablet (75 mcg total) by mouth daily before breakfast. 07/08/20   Fargo, Amy E, NP  mupirocin ointment (BACTROBAN) 2 % Apply 1 Application topically 2 (two) times daily. 09/11/22   [provider]  pantoprazole  (PROTONIX) 40 MG tablet Take 40 mg by mouth daily. 09/22/21   [provider]  PHENYTEK 300 MG ER capsule TAKE 1 CAPSULE AT BEDTIME 04/30/22   Windell Norfolk, MD  Polyethyl Glycol-Propyl Glycol (SYSTANE) 0.4-0.3 % SOLN Place 2 drops into both eyes 3 (three) times daily.    [provider]  polyethylene glycol (MIRALAX / GLYCOLAX) 17 g packet Take 17 g by mouth daily as needed for mild constipation.    [provider]  potassium chloride (KLOR-CON M) 10 MEQ tablet Take 10 mEq by mouth daily. 06/25/22   [provider]  primidone (MYSOLINE) 250 MG tablet Take 1 tablet (250 mg total) by mouth daily. 09/21/22 09/16/23  Windell Norfolk, MD  primidone (MYSOLINE) 50 MG tablet Take 3 tablets (150 mg total) by mouth at bedtime. 09/21/22 09/16/23  Windell Norfolk, MD  thiamine 250 MG tablet Take 250 mg by mouth daily.    [provider]  traMADol (ULTRAM) 50 MG tablet Take 1 tablet (50 mg total) by mouth every 8 (eight) hours as needed for moderate pain. 01/29/22   Ghimire, Werner Lean, MD  Verapamil HCl CR 200 MG CP24 Take 200 mg by mouth daily. 10/19/20   [provider]    Physical Exam: Vitals:   10/24/22 1157 10/24/22 1230 10/24/22 1300 10/24/22 1330  BP:  126/79 135/69 131/63  Pulse:  74 74 72  Resp:  16 18 14   Temp: 98.3 F (36.8 C)     TempSrc: Oral     SpO2:  97% 95% 94%    Constitutional: NAD, calm, comfortable Eyes: PERRL, lids and conjunctivae normal ENMT: Mucous membranes are moist.  Neck: normal, supple Respiratory: clear to auscultation bilaterally, no wheezing, no crackles. Normal respiratory effort. No accessory muscle use.  Cardiovascular: Regular rate and rhythm, no murmurs / rubs / gallops.  Trace edema Abdomen: no tenderness, small, easily reducible ventral hernia noted Musculoskeletal: no clubbing / cyanosis. Normal muscle tone.  Skin: no rashes, lesions, ulcers. No induration Neurologic: non focal   Labs on Admission: I have  personally reviewed following labs and imaging studies  CBC: Recent Labs  Lab 10/24/22 1233  WBC 9.7  NEUTROABS 7.4  HGB 11.1*  HCT 33.5*  MCV 90.5  PLT 304   Basic Metabolic Panel: Recent Labs  Lab 10/24/22 1233  NA 116*  K 4.3  CL 88*  CO2 19*  GLUCOSE 201*  BUN 13  CREATININE 0.71  CALCIUM 8.0*   Liver Function Tests: Recent Labs  Lab 10/24/22 1233  AST 15  ALT 14  ALKPHOS 87  BILITOT 0.5  PROT 6.4*  ALBUMIN 3.2*   Coagulation Profile: No results for input(s): "INR", "PROTIME" in the last 168 hours. BNP (last 3 results) No results for input(s): "PROBNP" in the last 8760 hours. CBG: Recent Labs  Lab 10/24/22 1302  GLUCAP 187*   Thyroid Function Tests: No results for input(s): "TSH", "T4TOTAL", "FREET4", "T3FREE", "THYROIDAB" in the last 72 hours.  Urine analysis:    Component Value Date/Time   COLORURINE YELLOW 12/21/2021 1654   APPEARANCEUR CLEAR 12/21/2021 1654   LABSPEC <1.005 (L) 12/21/2021 1654   PHURINE 6.5 12/21/2021 1654   GLUCOSEU NEGATIVE 12/21/2021 1654   GLUCOSEU NEGATIVE 12/28/2013 1036   HGBUR NEGATIVE 12/21/2021 1654   HGBUR trace-intact 06/29/2007 1304   BILIRUBINUR NEGATIVE 12/21/2021 1654   BILIRUBINUR negative 01/19/2017 1049   KETONESUR NEGATIVE 12/21/2021 1654   PROTEINUR NEGATIVE 12/21/2021 1654   UROBILINOGEN 0.2 01/19/2017 1049   UROBILINOGEN 1.0 03/25/2015 1755   NITRITE NEGATIVE 12/21/2021 1654   LEUKOCYTESUR TRACE (A) 12/21/2021 1654   EKG: Independently reviewed. Sinus rhythm   Assessment/plan Principal problem Hyponatremia -sodium quite low at 116, will admit to stepdown.  Likely due to GI losses.  Received fluids in the ED, will place on gentle hydration and recheck sodium later on today. -TSH pending -She has a degree of chronic hyponatremia sodium usually ranging between 126 and 134.  Most recent sodium obtained February 2024 was 131. -Obtain urine sodium and urine osmolality  Active  problems Insulin-dependent diabetes mellitus -place on long-acting and sliding scale for now, awaiting pharmacy reconciliation  Hypothyroidism -TSH pending.  Resume Synthroid once med rec is complete  Intermittent left lower extremity swelling -obtain vascular Doppler  Essential hypertension -resume home verapamil once dose verified by pharmacy  Status post left hand carpal tunnel surgery -hand quite swollen, bandage appears to be quite tight.  Bandage changed by RN.  DVT prophylaxis: Lovenox  Code Status: Full code per patient  Family Communication: sister at bedside  Disposition Plan: Home versus SNF when ready Bed Type: Stepdown Consults called: None Obs/Inp: Inpatient  At the time of admission, it appears that the appropriate admission status for this patient is INPATIENT as it is expected that patient will require hospital care > 2 midnights. This is judged to be reasonable and necessary in order to provide the required intensity of service to ensure the patient's safety given: presenting symptoms, initial radiographic and laboratory data and in the context of their chronic comorbidities. Together, these circumstances are felt to place patient at high at high risk for further clinical deterioration threatening life, limb, or organ.  Pamella Pert, MD, PhD [facility]  Contact via [email or web address]  10/24/2022, 2:15 PM

## 2022-10-24 NOTE — Progress Notes (Signed)
       CROSS COVER NOTE  NAME: [name] MRN: [id number] DOB : 1929-05-22    Concern as stated by nurse / staff    Severe chronic diarrhea 3 years Pain  left wrist 8/10 (recent carpal tunnel syndrome)unrelieved from tramadol given earlier    Pertinent findings on chart review:   Assessment and  Interventions   Assessment:  Plan: Imodium  Additional 50 mg dose given     Donnie Mesa NP [facility] Cross Cover 7pm-7am - check amion for availability Pager [phone]

## 2022-10-24 NOTE — ED Provider Notes (Signed)
[place] EMERGENCY DEPARTMENT AT [facility] Provider Note   CSN: [id number] Arrival date & time: 10/24/22  1101     History  Chief Complaint  Patient presents with   Weakness   Hand Pain    [name] is a 89 y.o. female with past medical history significant for anemia, dyslipidemia, cancer, hypertension, diabetes, hypothyroidism, asthma, IBS presents to the ED from home complaining of left hand swelling and generalized weakness.  Patient has been weak for the past couple of days and was unable to get up from the commode last night when she was having episodes of diarrhea.  Family states that they had to physically lift her, and they are unable to take care of her like this at home and are inquiring about placement in a SNF.  Patient recently had carpal tunnel surgery on her left hand and has a wrapping in place and is complaining of increased pain and swelling.  She has been taking tramadol without much relief of symptoms.  Patient has been eating and drinking like normal.  Denies fever, chest pain, shortness of breath, abdominal pain, nausea, vomiting, syncope, dizziness.       Home Medications Prior to Admission medications   Medication Sig Start Date End Date Taking? Authorizing Provider  acetaminophen (TYLENOL) 500 MG tablet Take 2 tablets (1,000 mg total) by mouth 3 (three) times daily. Patient taking differently: Take 1,000 mg by mouth every 8 (eight) hours as needed for moderate pain. 08/21/20   Joseph Art, DO  ALPRAZolam Prudy Feeler) 0.25 MG tablet Take 1 tablet (0.25 mg total) by mouth 2 (two) times daily as needed for anxiety. 01/29/22   Ghimire, Werner Lean, MD  Biotin 5000 MCG CAPS Take 5,000 mcg by mouth daily.    [provider]  Blood Glucose Monitoring Suppl (PRODIGY VOICE BLOOD GLUCOSE) w/Device KIT Use to check blood sugar 1 time per day. Patient not taking: Reported on 09/21/2022 10/18/15   Romero Belling, MD  capsicum (ZOSTRIX) 0.075 % topical cream  Apply 1 Application topically 4 (four) times daily as needed (pain). 08/04/21   [provider]  Cholecalciferol (VITAMIN D-3) 125 MCG (5000 UT) TABS Take 5,000 Units by mouth daily.    [provider]  cholestyramine (QUESTRAN) 4 g packet Take 4 g by mouth 2 (two) times daily.    [provider]  cyanocobalamin (,VITAMIN B-12,) 1000 MCG/ML injection Inject 1,000 mcg into the muscle every 21 ( twenty-one) days.    [provider]  diclofenac Sodium (VOLTAREN) 1 % GEL Apply 2 g topically 4 (four) times daily as needed (for knee and back pain). 06/01/19   [provider]  diphenhydrAMINE (BENADRYL) 25 mg capsule Take 25 mg by mouth every 8 (eight) hours as needed for allergies or sleep.    [provider]  glucose blood (GLUCOSE METER TEST) test strip See admin instructions.    [provider]  Glucose Blood (PRODIGY VOICE BLOOD GLUCOSE VI)     [provider]  HYDROcodone-acetaminophen (NORCO/VICODIN) 5-325 MG tablet Take 1 tablet by mouth 3 (three) times daily as needed for moderate pain or severe pain. 10/20/22   [provider]  insulin aspart (NOVOLOG) 100 UNIT/ML FlexPen Inject 2-12 Units into the skin See admin instructions. Injects 2-10 units of insulin under the skin 3 times a day per sliding scale: CBG 0-150: 2 units; CBG 151-200: 4 units; CBG 201-250: 6 units; CBG 251-300: 8 units; CBG 301-350: 10 units; CBG  351-400: 12 units; CBG >400: 12 units and notify the MD/NP Patient taking differently: Inject 2-12 Units into the skin See admin instructions. Inject 2-10 units into the skin 3 times a day per sliding scale: CBG 0-150: 2 units; CBG 151-200: 4 units; CBG 201-250: 6 units; CBG 251-300: 8 units; CBG 301-350: 10 units; CBG 351-400: 12 units; CBG >400: 12 units and notify the MD/NP 07/08/20   Coletta Memos, Amy E, NP  insulin glargine (LANTUS SOLOSTAR) 100 UNIT/ML Solostar Pen Inject 10 Units into the skin at bedtime. Patient  taking differently: Inject 10 Units into the skin See admin instructions. Inject 10 units into the skin every afternoon- when eating 08/21/20   Marlin Canary U, DO  Insulin Pen Needle 32G X 4 MM MISC Used to inject insulin 3x daily Patient taking differently: 1 each by Other route See admin instructions. Used to inject insulin 3x daily 11/19/16   Romero Belling, MD  levothyroxine (SYNTHROID) 75 MCG tablet Take 1 tablet (75 mcg total) by mouth daily before breakfast. 07/08/20   Fargo, Amy E, NP  mupirocin ointment (BACTROBAN) 2 % Apply 1 Application topically 2 (two) times daily. 09/11/22   [provider]  pantoprazole (PROTONIX) 40 MG tablet Take 40 mg by mouth daily. 09/22/21   [provider]  PHENYTEK 300 MG ER capsule TAKE 1 CAPSULE AT BEDTIME 04/30/22   Windell Norfolk, MD  Polyethyl Glycol-Propyl Glycol (SYSTANE) 0.4-0.3 % SOLN Place 2 drops into both eyes 3 (three) times daily.    [provider]  polyethylene glycol (MIRALAX / GLYCOLAX) 17 g packet Take 17 g by mouth daily as needed for mild constipation.    [provider]  potassium chloride (KLOR-CON M) 10 MEQ tablet Take 10 mEq by mouth daily. 06/25/22   [provider]  primidone (MYSOLINE) 250 MG tablet Take 1 tablet (250 mg total) by mouth daily. 09/21/22 09/16/23  Windell Norfolk, MD  primidone (MYSOLINE) 50 MG tablet Take 3 tablets (150 mg total) by mouth at bedtime. 09/21/22 09/16/23  Windell Norfolk, MD  thiamine 250 MG tablet Take 250 mg by mouth daily.    [provider]  traMADol (ULTRAM) 50 MG tablet Take 1 tablet (50 mg total) by mouth every 8 (eight) hours as needed for moderate pain. 01/29/22   Ghimire, Werner Lean, MD  Verapamil HCl CR 200 MG CP24 Take 200 mg by mouth daily. 10/19/20   [provider]      Allergies    Bystolic [nebivolol hcl], Carbamazepine, Cholestyramine, Morphine, Niacin, Niaspan [niacin er], Norvasc [amlodipine besylate], Optivar [azelastine hcl],  Repaglinide, Sular [nisoldipine er], Telmisartan, Amoxicillin-pot clavulanate, Cefdinir, Ciprofloxacin hcl, Clonidine, Clonidine hydrochloride, Codeine, Ezetimibe, Hydroxychloroquine, Naproxen, Sulfa antibiotics, Ziac [bisoprolol-hydrochlorothiazide], Amlodipine besy-benazepril hcl, Azelastine, Elavil [amitriptyline], Empagliflozin, Hydralazine, Iodine i 131 tositumomab, Kenalog [triamcinolone], Keppra [levetiracetam], Lamotrigine, Misc. sulfonamide containing compounds, Pregabalin, Pseudoephedrine, Pseudoephedrine-guaifenesin er, Risedronate, Ru-hist d [brompheniramine-phenylephrine], Sumatriptan, Topiramate, Ace inhibitors, Actonel [risedronate sodium], Aspirin, Atacand [candesartan], Bextra [valdecoxib], Bisoprolol-hydrochlorothiazide, Cefadroxil, Celecoxib, Hydrocodone, Hydrocodone-acetaminophen, Iodinated contrast media, Meloxicam, Methylprednisolone sodium succinate, Nabumetone, Penicillins, Pseudoephedrine-guaifenesin, Risedronate sodium, Rofecoxib, Ru-tuss [chlorphen-pse-atrop-hyos-scop], Sulfonamide derivatives, Sulphur [elemental sulfur], Telithromycin, Terfenadine, Trandolapril-verapamil hcl er, Trandolapril-verapamil hcl er, and Valium [diazepam]    Review of Systems   Review of Systems  Constitutional:  Negative for fever.  Respiratory:  Negative for shortness of breath.   Cardiovascular:  Negative for chest pain.  Gastrointestinal:  Positive for diarrhea. Negative for abdominal pain, nausea and vomiting.  Neurological:  Positive for weakness (lower extremities). Negative for dizziness and  syncope.    Physical Exam Updated Vital Signs BP 122/67   Pulse 73   Temp 98.3 F (36.8 C) (Oral)   Resp 16   SpO2 94%  Physical Exam Vitals and nursing note reviewed.  Constitutional:      General: She is not in acute distress.    Appearance: Normal appearance. She is not ill-appearing or diaphoretic.  HENT:     Mouth/Throat:     Mouth: Mucous membranes are dry.  Eyes:      Conjunctiva/sclera: Conjunctivae normal.     Pupils: Pupils are equal, round, and reactive to light.  Cardiovascular:     Rate and Rhythm: Normal rate and regular rhythm.  Pulmonary:     Effort: Pulmonary effort is normal. No tachypnea or respiratory distress.     Breath sounds: Normal breath sounds and air entry.  Abdominal:     General: Abdomen is flat.     Palpations: Abdomen is soft.     Tenderness: There is no abdominal tenderness.  Musculoskeletal:     Left hand: Swelling and tenderness present. Decreased range of motion. Normal sensation. Normal capillary refill.  Skin:    General: Skin is warm and dry.     Capillary Refill: Capillary refill takes less than 2 seconds.     Coloration: Skin is not pale.  Neurological:     Mental Status: She is alert. Mental status is at baseline.     GCS: GCS eye subscore is 4. GCS verbal subscore is 5. GCS motor subscore is 6.     Cranial Nerves: Cranial nerves 2-12 are intact.     Sensory: Sensation is intact.     Comments: CNII-XII grossly intact.  Speech is clear.  Patient is alert and oriented x4.  2/5 strength in bilateral lower extremities.  5/5 strength in RUE, unable to assess LUE due to severe hand swelling and pain.  Sensation is grossly intact.   Psychiatric:        Mood and Affect: Mood normal.        Speech: Speech normal.        Behavior: Behavior normal.     ED Results / Procedures / Treatments   Labs (all labs ordered are listed, but only abnormal results are displayed) Labs Reviewed  CBC WITH DIFFERENTIAL/PLATELET - Abnormal; Notable for the following components:      Result Value   RBC 3.70 (*)    Hemoglobin 11.1 (*)    HCT 33.5 (*)    All other components within normal limits  COMPREHENSIVE METABOLIC PANEL - Abnormal; Notable for the following components:   Sodium 116 (*)    Chloride 88 (*)    CO2 19 (*)    Glucose, Bld 201 (*)    Calcium 8.0 (*)    Total Protein 6.4 (*)    Albumin 3.2 (*)    All other  components within normal limits  CBG MONITORING, ED - Abnormal; Notable for the following components:   Glucose-Capillary 187 (*)    All other components within normal limits  TSH  URINALYSIS, ROUTINE W REFLEX MICROSCOPIC  SODIUM, URINE, RANDOM  OSMOLALITY, URINE  SODIUM  SODIUM  HEMOGLOBIN A1C    EKG EKG Interpretation  Date/Time:  Saturday October 24 2022 12:49:59 EDT Ventricular Rate:  74 PR Interval:  218 QRS Duration: 106 QT Interval:  408 QTC Calculation: 453 R Axis:   -36 Text Interpretation: Sinus rhythm Borderline prolonged PR interval Inferior infarct, old no acute ST/T changes Confirmed  by Pricilla Loveless [phone]) on 10/24/2022 1:15:41 PM  Radiology No results found.  Procedures Procedures    Medications Ordered in ED Medications  insulin glargine-yfgn (SEMGLEE) injection 8 Units (has no administration in time range)  insulin aspart (novoLOG) injection 0-9 Units (has no administration in time range)  insulin aspart (novoLOG) injection 0-5 Units (has no administration in time range)  lactated ringers bolus 1,000 mL (0 mLs Intravenous Stopped 10/24/22 1356)  fentaNYL (SUBLIMAZE) injection 50 mcg (50 mcg Intravenous Given 10/24/22 1323)    ED Course/ Medical Decision Making/ A&P                             Medical Decision Making Amount and/or Complexity of Data Reviewed Labs: ordered.   This patient presents to the ED with chief complaint(s) of generalized weakness, diarrhea with pertinent past medical history of hypothyroidism, cancer, anxiety, diabetes, hypertension, hyperlipidemia.  The complaint involves an extensive differential diagnosis and also carries with it a high risk of complications and morbidity.    The differential diagnosis includes metabolic derangement, cardiac dysrhythmia, hypoglycemia, infectious etiology, sepsis, anemia, dehydration, hypothyroidism, polypharmacy  The initial plan is to obtain baseline labs, EKG, TSH  Additional history  obtained: Additional history obtained from family at bedside state that patient has been weak over the past 2 days and was unable to walk or use her legs starting last night.  Family state they are unable lift her or care for her at home.   Initial Assessment:   On exam, patient is resting comfortably in bed and does not appear to be in acute distress.  She is alert and oriented x 4.  Speech is clear.  CN II through XII grossly intact.  2/5 strength in bilateral lower extremities with sensation grossly intact.  5/5 strength in right upper extremity, unable to assess left upper extremity due to severe hand swelling and pain.  Left hand with edema and ecchymosis.  There is a Coban wrap on the left hand and wrist.  Sensation to left digits intact.  Independent ECG/labs interpretation:  The following labs were independently interpreted:  CBC with mild anemia and, no leukocytosis.  Metabolic panel with critically low sodium 116, no other major electrolyte derangement.  Renal and hepatic function are normal.  CBG 187.    Treatment and Reassessment: Patient will be given 500 mL of lactated Ringer's as well as fentanyl for her left hand pain.  Will have RN rewrap hand as I suspect that the wrapping is too tight given patient's swelling.  I believe that patient will benefit from hospital admission for hyponatremia.  Consultations obtained:   I requested consultation with on-call hospitalist and spoke with Dr. Pamella Pert who agrees with hospital admission.    Disposition:   Patient to be admitted to [facility].           Final Clinical Impression(s) / ED Diagnoses Final diagnoses:  Hyponatremia  Generalized weakness  Left hand pain    Rx / DC Orders ED Discharge Orders     None         Lenard Simmer, PA-C 10/24/22 1441    Pricilla Loveless, MD 10/24/22 1442

## 2022-10-25 ENCOUNTER — Inpatient Hospital Stay (HOSPITAL_COMMUNITY): Payer: Medicare Other

## 2022-10-25 DIAGNOSIS — M7989 Other specified soft tissue disorders: Secondary | ICD-10-CM

## 2022-10-25 DIAGNOSIS — E871 Hypo-osmolality and hyponatremia: Secondary | ICD-10-CM | POA: Diagnosis not present

## 2022-10-25 LAB — GASTROINTESTINAL PANEL BY PCR, STOOL (REPLACES STOOL CULTURE)

## 2022-10-25 LAB — BASIC METABOLIC PANEL
Anion gap: 12 (ref 5–15)
BUN: 13 mg/dL (ref 8–23)
CO2: 16 mmol/L — ABNORMAL LOW (ref 22–32)
Calcium: 7.8 mg/dL — ABNORMAL LOW (ref 8.9–10.3)
Chloride: 92 mmol/L — ABNORMAL LOW (ref 98–111)
Creatinine, Ser: 0.68 mg/dL (ref 0.44–1.00)
GFR, Estimated: >60 mL/min (ref >60)
Glucose, Bld: 203 mg/dL — ABNORMAL HIGH (ref 70–99)
Potassium: 4.4 mmol/L (ref 3.5–5.1)
Sodium: 120 mmol/L — ABNORMAL LOW (ref 135–145)

## 2022-10-25 LAB — SODIUM
Sodium: 121 mmol/L — ABNORMAL LOW (ref 135–145)
Sodium: 125 mmol/L — ABNORMAL LOW (ref 135–145)
Sodium: 126 mmol/L — ABNORMAL LOW (ref 135–145)

## 2022-10-25 LAB — GLUCOSE, CAPILLARY
Glucose-Capillary: 153 mg/dL — ABNORMAL HIGH (ref 70–99)
Glucose-Capillary: 192 mg/dL — ABNORMAL HIGH (ref 70–99)
Glucose-Capillary: 167 mg/dL — ABNORMAL HIGH (ref 70–99)
Glucose-Capillary: 158 mg/dL — ABNORMAL HIGH (ref 70–99)

## 2022-10-25 LAB — OSMOLALITY, URINE: Osmolality, Ur: 257 mOsm/kg — ABNORMAL LOW (ref 300–900)

## 2022-10-25 MED ORDER — METHOCARBAMOL 500 MG PO TABS
500.0000 mg | ORAL_TABLET | Freq: Four times a day (QID) | ORAL | Status: DC | PRN
  Administered 2022-10-25 – 2022-10-28 (×8): 500 mg via ORAL
  Filled 2022-10-25 (×8): qty 1

## 2022-10-25 NOTE — Evaluation (Signed)
Physical Therapy Evaluation Patient Details Name: [name] MRN: [id number] DOB: [birth year] Today's Date: 10/25/2022  History of Present Illness  Patient is a 89 year old female who presented to the hospital on 6/22 with weakness and significant diarrhea for past few days. Of note patient had recent carpal tunnel surgery on L wrist on 6/18.patient was admitted with hyponatremia, and hypothyroidism.  PMH: breast cancer, carpal tunnel syndrome bilaterally, anemia, asthma, osteoporosis, R THA,  Clinical Impression  Pt admitted with above diagnosis.  Pt with limited participation today d/t c/o fatigue and being sleepy;  pt reluctantly cooperative with bed change d/t bed being soaked in urine despite pt having purewick. Pt will benefit from post acute rehab, will follow acutely  Pt currently with functional limitations due to the deficits listed below (see PT Problem List). Pt will benefit from acute skilled PT to increase their independence and safety with mobility to allow discharge.          Recommendations for follow up therapy are one component of a multi-disciplinary discharge planning process, led by the attending physician.  Recommendations Grossi be updated based on patient status, additional functional criteria and insurance authorization.  Follow Up Recommendations Can patient physically be transported by private vehicle: No     Assistance Recommended at Discharge Intermittent Supervision/Assistance  Patient can return home with the following  A lot of help with walking and/or transfers;A lot of help with bathing/dressing/bathroom;Assist for transportation;Help with stairs or ramp for entrance    Equipment Recommendations None recommended by PT  Recommendations for Other Services       Functional Status Assessment Patient has had a recent decline in their functional status and demonstrates the ability to make significant improvements in function in a reasonable and predictable amount  of time.     Precautions / Restrictions Precautions Precautions: Fall Restrictions Weight Bearing Restrictions: No      Mobility  Bed Mobility Overal bed mobility: Needs Assistance (Simultaneous filing. User Lafevers not have seen previous data.) Bed Mobility: Rolling (Simultaneous filing. User Nordgren not have seen previous data.) Rolling: Max assist, Min assist         General bed mobility comments: patient was max A to roll to L side and min A to roll to R side.    Transfers                   General transfer comment: pt declined    Ambulation/Gait                  Stairs            Wheelchair Mobility    Modified Rankin (Stroke Patients Only)       Balance                                             Pertinent Vitals/Pain Pain Assessment Pain Assessment: Faces    Home Living Family/patient expects to be discharged to:: Private residence                 Home Equipment: Agricultural consultant (2 wheels);Rollator (4 wheels);Shower seat;BSC/3in1      Prior Function Prior Level of Function : Independent/Modified Independent               ADLs Comments: patient reported being independent but was fatigued  and not able to provide  more information. PLOF info taken from chart.     Hand Dominance   Dominant Hand: Right    Extremity/Trunk Assessment   Upper Extremity Assessment Upper Extremity Assessment: Defer to OT evaluation LUE Deficits / Details: has soft cast in place with increasd edema in all digits on hand with education on keeping hand elevated provided. patient able to move shoulder and elbow with noted grimancing. carpal tunnel surgery on 6/18. LUE Coordination: decreased gross motor;decreased fine motor (recent carpal tunnel surgery)    Lower Extremity Assessment Lower Extremity Assessment: Generalized weakness;LLE deficits/detail;RLE deficits/detail;Difficult to assess due to impaired cognition RLE  Deficits / Details: appears grossly WFL fro ROM; unable to test strength d/t lethargy LLE Deficits / Details: grimacing with knee AAROM; lethargy limiting testing       Communication   Communication: No difficulties  Cognition Arousal/Alertness: Lethargic Behavior During Therapy: Flat affect Overall Cognitive Status: Difficult to assess                                 General Comments: patient was lethargic with recent administration of muscle relaxers.        General Comments      Exercises     Assessment/Plan    PT Assessment Patient needs continued PT services  PT Problem List Decreased strength;Decreased range of motion;Decreased activity tolerance;Decreased mobility;Decreased balance;Pain;Decreased knowledge of precautions;Decreased knowledge of use of DME       PT Treatment Interventions DME instruction;Therapeutic exercise;Gait training;Functional mobility training;Therapeutic activities;Patient/family education    PT Goals (Current goals can be found in the Care Plan section)  Acute Rehab PT Goals PT Goal Formulation: With patient Time For Goal Achievement: 11/08/22 Potential to Achieve Goals: Good    Frequency Min 1X/week     Co-evaluation   Reason for Co-Treatment: To address functional/ADL transfers PT goals addressed during session: Mobility/safety with mobility OT goals addressed during session: ADL's and self-care       AM-PAC PT "6 Clicks" Mobility  Outcome Measure Help needed turning from your back to your side while in a flat bed without using bedrails?: A Lot Help needed moving from lying on your back to sitting on the side of a flat bed without using bedrails?: Total Help needed moving to and from a bed to a chair (including a wheelchair)?: Total Help needed standing up from a chair using your arms (e.g., wheelchair or bedside chair)?: Total   Help needed climbing 3-5 steps with a railing? : Total 6 Click Score: 6    End of  Session   Activity Tolerance: Patient limited by lethargy Patient left: in bed;with call bell/phone within reach;with bed alarm set        Time: 1400-1416 PT Time Calculation (min) (ACUTE ONLY): 16 min   Charges:              Delice Bison, PT  Acute Rehab Dept [facility]) [phone]  10/25/2022   [facility] 10/25/2022, 3:15 PM

## 2022-10-25 NOTE — Plan of Care (Signed)
  Problem: Education: Goal: Ability to describe self-care measures that Pardini prevent or decrease complications (Diabetes Survival Skills Education) will improve Outcome: Progressing   Problem: Coping: Goal: Ability to adjust to condition or change in health will improve Outcome: Progressing   Problem: Fluid Volume: Goal: Ability to maintain a balanced intake and output will improve Outcome: Progressing   Problem: Health Behavior/Discharge Planning: Goal: Ability to identify and utilize available resources and services will improve Outcome: Progressing Goal: Ability to manage health-related needs will improve Outcome: Progressing   Problem: Metabolic: Goal: Ability to maintain appropriate glucose levels will improve Outcome: Progressing   Problem: Nutritional: Goal: Maintenance of adequate nutrition will improve Outcome: Progressing Goal: Progress toward achieving an optimal weight will improve Outcome: Progressing   Problem: Skin Integrity: Goal: Risk for impaired skin integrity will decrease Outcome: Progressing   Problem: Tissue Perfusion: Goal: Adequacy of tissue perfusion will improve Outcome: Progressing   Problem: Education: Goal: Knowledge of General Education information will improve Description: Including pain rating scale, medication(s)/side effects and non-pharmacologic comfort measures Outcome: Progressing   Problem: Health Behavior/Discharge Planning: Goal: Ability to manage health-related needs will improve Outcome: Progressing   Problem: Clinical Measurements: Goal: Ability to maintain clinical measurements within normal limits will improve Outcome: Progressing Goal: Will remain free from infection Outcome: Progressing Goal: Diagnostic test results will improve Outcome: Progressing Goal: Respiratory complications will improve Outcome: Progressing Goal: Cardiovascular complication will be avoided Outcome: Progressing   Problem: Activity: Goal:  Risk for activity intolerance will decrease Outcome: Progressing   Problem: Nutrition: Goal: Adequate nutrition will be maintained Outcome: Progressing   Problem: Coping: Goal: Level of anxiety will decrease Outcome: Progressing   Problem: Elimination: Goal: Will not experience complications related to bowel motility Outcome: Progressing Goal: Will not experience complications related to urinary retention Outcome: Progressing   Problem: Pain Managment: Goal: General experience of comfort will improve Outcome: Progressing   Problem: Safety: Goal: Ability to remain free from injury will improve Outcome: Progressing   Problem: Skin Integrity: Goal: Risk for impaired skin integrity will decrease Outcome: Progressing   Cindy S. Clelia Croft BSN, RN, [employer], CCRN 10/25/2022 5:39 AM

## 2022-10-25 NOTE — Progress Notes (Signed)
PROGRESS NOTE  [name] UXL:[id number] DOB: Jan 06, 1930 DOA: 10/24/2022 PCP: Verlon Au, MD  HPI/Recap of past 24 hours: [name] is a 89 y.o. female with medical history significant for DM, chronic hyponatremia, hypothyroidism, depression, who comes into the hospital with generalized weakness as well as significant diarrhea.  Symptoms have been present for the past couple of days, with associated poor oral intake. Of note, she recently had left hand carpal tunnel surgery on 10/20/2022 by Dr. Bradly Bienenstock from [facility], recommended postop follow up on 10/27/22. In the ED, vital signs stable, labs stable except for sodium of 116.  Patient admitted for further management given hyponatremia.   Today, patient denies any new complaints, still having diarrhea of which she reports is chronic but has been worsening for the past week, denies any abdominal pain, nausea/vomiting, fever/chills.  Left upper extremity still appears swollen, complaining of pain, dressing intact.   Assessment/Plan: Principal Problem:   Hyponatremia  Acute on chronic hyponatremia  Improving Sodium level typically ranges around 126 and 134 Likely due to GI losses, poor oral intake TSH WNL Urine osmolality 257, urine sodium 20 Continue gentle IV hydration  Chronic diarrhea Reports its worsening Noted to be loose, GIP panel pending Continue PTA cholestyramine   Insulin-dependent diabetes mellitus Last A1c 6.9, well-controlled SSI, Semglee, Accu-Cheks, hypoglycemic protocol   Hypothyroidism TSH WNL  Resume Synthroid   Intermittent left lower extremity swelling  Bilateral vascular Doppler negative for DVT   Essential hypertension Continue home verapamil   Status post left hand carpal tunnel surgery Still with some swelling and pain Surgery on 10/20/2022 by Dr. Bradly Bienenstock from [facility], recommended postop follow up on 10/27/22 Pain management OT consulted  Generalized weakness Likely  multifactorial PT/OT-recommend SNF     Estimated body mass index is 22.24 kg/m as calculated from the following:   Height as of this encounter: 5\' 7"  (1.702 m).   Weight as of this encounter: 64.4 kg.     Code Status: Full  Family Communication: None at bedside  Disposition Plan: Status is: Inpatient Remains inpatient appropriate because: Level of care      Consultants: None  Procedures: None  Antimicrobials: None  DVT prophylaxis: Lovenox   Objective: Vitals:   10/25/22 0959 10/25/22 1000 10/25/22 1150 10/25/22 1315  BP:  (!) 157/76  (!) 162/75  Pulse: 81 81  69  Resp: 16 14    Temp:   98 F (36.7 C) 97.6 F (36.4 C)  TempSrc:    Oral  SpO2: 100% 99%  100%  Weight:      Height:        Intake/Output Summary (Last 24 hours) at 10/25/2022 1532 Last data filed at 10/25/2022 1400 Gross per 24 hour  Intake 1889.16 ml  Output 700 ml  Net 1189.16 ml   Filed Weights   10/24/22 1600  Weight: 64.4 kg    Exam: General: NAD  Cardiovascular: S1, S2 present Respiratory: CTAB Abdomen: Soft, nontender, nondistended, bowel sounds present, reducible ventral hernia noted Musculoskeletal: Trace bilateral pedal edema noted, left hand swelling noted with pain, dressing clean/dry/intact Skin: Normal Psychiatry: Normal mood     Data Reviewed: CBC: Recent Labs  Lab 10/24/22 1233  WBC 9.7  NEUTROABS 7.4  HGB 11.1*  HCT 33.5*  MCV 90.5  PLT 304   Basic Metabolic Panel: Recent Labs  Lab 10/24/22 1233 10/24/22 1648 10/24/22 1649 10/24/22 2253 10/25/22 0505 10/25/22 1226  NA 116* 120* 119* 122* 121* 125*  K 4.3 4.4  --   --   --   --   CL 88* 92*  --   --   --   --   CO2 19* 16*  --   --   --   --   GLUCOSE 201* 203*  --   --   --   --   BUN 13 13  --   --   --   --   CREATININE 0.71 0.68  --   --   --   --   CALCIUM 8.0* 7.8*  --   --   --   --    GFR: Estimated Creatinine Clearance: 45.5 mL/min (by C-G formula based on SCr of 0.68  mg/dL). Liver Function Tests: Recent Labs  Lab 10/24/22 1233  AST 15  ALT 14  ALKPHOS 87  BILITOT 0.5  PROT 6.4*  ALBUMIN 3.2*   No results for input(s): "LIPASE", "AMYLASE" in the last 168 hours. No results for input(s): "AMMONIA" in the last 168 hours. Coagulation Profile: No results for input(s): "INR", "PROTIME" in the last 168 hours. Cardiac Enzymes: No results for input(s): "CKTOTAL", "CKMB", "CKMBINDEX", "TROPONINI" in the last 168 hours. BNP (last 3 results) No results for input(s): "PROBNP" in the last 8760 hours. HbA1C: Recent Labs    10/24/22 1648  HGBA1C 6.9*   CBG: Recent Labs  Lab 10/24/22 1302 10/24/22 1609 10/24/22 2219 10/25/22 0744 10/25/22 1145  GLUCAP 187* 151* 226* 153* 192*   Lipid Profile: No results for input(s): "CHOL", "HDL", "LDLCALC", "TRIG", "CHOLHDL", "LDLDIRECT" in the last 72 hours. Thyroid Function Tests: Recent Labs    10/24/22 1320  TSH 2.959   Anemia Panel: No results for input(s): "VITAMINB12", "FOLATE", "FERRITIN", "TIBC", "IRON", "RETICCTPCT" in the last 72 hours. Urine analysis:    Component Value Date/Time   COLORURINE YELLOW 10/24/2022 2131   APPEARANCEUR CLEAR 10/24/2022 2131   LABSPEC 1.008 10/24/2022 2131   PHURINE 5.0 10/24/2022 2131   GLUCOSEU NEGATIVE 10/24/2022 2131   GLUCOSEU NEGATIVE 12/28/2013 1036   HGBUR NEGATIVE 10/24/2022 2131   HGBUR trace-intact 06/29/2007 1304   BILIRUBINUR NEGATIVE 10/24/2022 2131   BILIRUBINUR negative 01/19/2017 1049   KETONESUR NEGATIVE 10/24/2022 2131   PROTEINUR NEGATIVE 10/24/2022 2131   UROBILINOGEN 0.2 01/19/2017 1049   UROBILINOGEN 1.0 03/25/2015 1755   NITRITE NEGATIVE 10/24/2022 2131   LEUKOCYTESUR SMALL (A) 10/24/2022 2131   Sepsis Labs: @LABRCNTIP (procalcitonin:4,lacticidven:4)  ) Recent Results (from the past 240 hour(s))  MRSA Next Gen by PCR, Nasal     Status: None   Collection Time: 10/24/22  3:42 PM   Specimen: Nasal Mucosa; Nasal Swab  Result Value  Ref Range Status   MRSA by PCR Next Gen NOT DETECTED NOT DETECTED Final    Comment: (NOTE) The GeneXpert MRSA Assay (FDA approved for NASAL specimens only), is one component of a comprehensive MRSA colonization surveillance program. It is not intended to diagnose MRSA infection nor to guide or monitor treatment for MRSA infections. Test performance is not FDA approved in patients less than 28 years old. Performed at [facility], [address] [place], [place] [address]   Gastrointestinal Panel by PCR , Stool     Status: None   Collection Time: 10/25/22  9:21 AM   Specimen: Stool  Result Value Ref Range Status   Campylobacter species NOT DETECTED NOT DETECTED Final   Plesimonas shigelloides NOT DETECTED NOT DETECTED Final   Salmonella species NOT DETECTED NOT DETECTED Final  Yersinia enterocolitica NOT DETECTED NOT DETECTED Final   Vibrio species NOT DETECTED NOT DETECTED Final   Vibrio cholerae NOT DETECTED NOT DETECTED Final   Enteroaggregative E coli (EAEC) NOT DETECTED NOT DETECTED Final   Enteropathogenic E coli (EPEC) NOT DETECTED NOT DETECTED Final   Enterotoxigenic E coli (ETEC) NOT DETECTED NOT DETECTED Final   Shiga like toxin producing E coli (STEC) NOT DETECTED NOT DETECTED Final   Shigella/Enteroinvasive E coli (EIEC) NOT DETECTED NOT DETECTED Final   Cryptosporidium NOT DETECTED NOT DETECTED Final   Cyclospora cayetanensis NOT DETECTED NOT DETECTED Final   Entamoeba histolytica NOT DETECTED NOT DETECTED Final   Giardia lamblia NOT DETECTED NOT DETECTED Final   Adenovirus F40/41 NOT DETECTED NOT DETECTED Final   Astrovirus NOT DETECTED NOT DETECTED Final   Norovirus GI/GII NOT DETECTED NOT DETECTED Final   Rotavirus A NOT DETECTED NOT DETECTED Final   Sapovirus (I, II, IV, and V) NOT DETECTED NOT DETECTED Final    Comment: Performed at [facility], [address] [place], [place] [address]      Studies: VAS [place] LOWER EXTREMITY  VENOUS (DVT)  Result Date: 10/25/2022  Lower Venous DVT Study Patient Name:  Tatym S Gilcrease   Date of Exam:   10/25/2022 Medical Rec #: [id number]   Accession #:    [id number] Date of Birth: [birth year]  Patient Gender: F Patient Age:   89 years Exam Location:  [facility] Procedure:      VAS [place] LOWER EXTREMITY VENOUS (DVT) Referring Phys: Pamella Pert --------------------------------------------------------------------------------  Indications: Edema.  Comparison Study: Previous RLEV on 04/21/22 was negative for DVT Performing Technologist: Ernestene Mention RVT, RDMS  Examination Guidelines: A complete evaluation includes B-mode imaging, spectral Doppler, color Doppler, and power Doppler as needed of all accessible portions of each vessel. Bilateral testing is considered an integral part of a complete examination. Limited examinations for reoccurring indications Hollerbach be performed as noted. The reflux portion of the exam is performed with the patient in reverse Trendelenburg.  +---------+---------------+---------+-----------+----------+--------------+ RIGHT    CompressibilityPhasicitySpontaneityPropertiesThrombus Aging +---------+---------------+---------+-----------+----------+--------------+ CFV      Full           Yes      Yes                                 +---------+---------------+---------+-----------+----------+--------------+ SFJ      Full                                                        +---------+---------------+---------+-----------+----------+--------------+ FV Prox  Full           Yes      Yes                                 +---------+---------------+---------+-----------+----------+--------------+ FV Mid   Full           Yes      Yes                                 +---------+---------------+---------+-----------+----------+--------------+ FV DistalFull           Yes  Yes                                  +---------+---------------+---------+-----------+----------+--------------+ PFV      Full                                                        +---------+---------------+---------+-----------+----------+--------------+ POP      Full           Yes      Yes                                 +---------+---------------+---------+-----------+----------+--------------+ PTV      Full                                                        +---------+---------------+---------+-----------+----------+--------------+ PERO     Full                                                        +---------+---------------+---------+-----------+----------+--------------+   +---------+---------------+---------+-----------+----------+--------------+ LEFT     CompressibilityPhasicitySpontaneityPropertiesThrombus Aging +---------+---------------+---------+-----------+----------+--------------+ CFV      Full           Yes      Yes                                 +---------+---------------+---------+-----------+----------+--------------+ SFJ      Full                                                        +---------+---------------+---------+-----------+----------+--------------+ FV Prox  Full           Yes      Yes                                 +---------+---------------+---------+-----------+----------+--------------+ FV Mid   Full           Yes      Yes                                 +---------+---------------+---------+-----------+----------+--------------+ FV DistalFull           Yes      Yes                                 +---------+---------------+---------+-----------+----------+--------------+ PFV      Full                                                        +---------+---------------+---------+-----------+----------+--------------+  POP      Full           Yes      Yes                                  +---------+---------------+---------+-----------+----------+--------------+ PTV      Full                                                        +---------+---------------+---------+-----------+----------+--------------+ PERO     Full                                                        +---------+---------------+---------+-----------+----------+--------------+     Summary: BILATERAL: - No evidence of deep vein thrombosis seen in the lower extremities, bilaterally. -No evidence of popliteal cyst, bilaterally.   *See table(s) above for measurements and observations. Electronically signed by Waverly Ferrari MD on 10/25/2022 at 12:13:51 PM.    Final     Scheduled Meds:  Chlorhexidine Gluconate Cloth  6 each Topical Daily   cholestyramine light  4 g Oral BID   enoxaparin (LOVENOX) injection  40 mg Subcutaneous QHS   insulin aspart  0-5 Units Subcutaneous QHS   insulin aspart  0-9 Units Subcutaneous TID WC   insulin glargine-yfgn  8 Units Subcutaneous Daily   levothyroxine  75 mcg Oral QAC breakfast   pantoprazole  40 mg Oral Daily   phenytoin  300 mg Oral Daily   primidone  150 mg Oral QHS   primidone  250 mg Oral Daily   verapamil  180 mg Oral Daily    Continuous Infusions:  sodium chloride 75 mL/hr at 10/25/22 0600     LOS: 1 day     Briant Cedar, MD [facility]  If 7PM-7AM, please contact night-coverage [email or web address] 10/25/2022, 3:32 PM

## 2022-10-25 NOTE — Progress Notes (Signed)
Scant amount of type 6 stool collected and sent down for GI panel. Lab tech unsure if amount of stool would be sufficient; recollect Naser be necessary. Order for c-diff cancelled due to consistency of stool; discussed w/ Dr. Sharolyn Douglas

## 2022-10-25 NOTE — Progress Notes (Signed)
BLE venous duplex has been completed.   Results can be found under chart review under CV PROC. 10/25/2022 11:27 AM Bryten Maher RVT, RDMS

## 2022-10-25 NOTE — Evaluation (Addendum)
Occupational Therapy Evaluation Patient Details Name: [name] MRN: [id number] DOB: [birth year] Today's Date: 10/25/2022   History of Present Illness Patient is a 89 year old female who presented to the hospital on 6/22 with weakness and significant diarrhea for past few days. Of note patient had recent carpal tunnel surgery on L wrist on 6/18.patient was admitted with hyponatremia, and hypothyroidism.  PMH: breast cancer, carpal tunnel syndrome bilaterally, anemia, asthma, osteoporosis, R THA,   Clinical Impression   Patient is a 89 year old female who was admitted for above. Patient was living at home with independence prior to recent carpal tunnel surgery on 6/18. currently patient was max A to roll to L side and min A to roll to R side with increased pain with movement on L side LUE and LLE. Patient positioned with LUE elevated with patient moving away from pillows even with education provided on elevation. Nurse made aware. Patient was noted to have decreased functional activity tolernace, decreased ROM, decreased BUE strength, decreased endurance, decreased sitting balance, decreased standing balanced, decreased safety awareness, and decreased knowledge of AE/AD impacting participation in ADLs. Patient would continue to benefit from skilled OT services at this time while admitted and after d/c to address noted deficits in order to improve overall safety and independence in ADLs.     Recommendations for follow up therapy are one component of a multi-disciplinary discharge planning process, led by the attending physician.  Recommendations Economou be updated based on patient status, additional functional criteria and insurance authorization.   Assistance Recommended at Discharge Frequent or constant Supervision/Assistance  Patient can return home with the following Two people to help with walking and/or transfers;Two people to help with bathing/dressing/bathroom;Assistance with  cooking/housework;Direct supervision/assist for medications management;Assist for transportation;Help with stairs or ramp for entrance;Direct supervision/assist for financial management    Functional Status Assessment  Patient has had a recent decline in their functional status and demonstrates the ability to make significant improvements in function in a reasonable and predictable amount of time.  Equipment Recommendations  None recommended by OT       Precautions / Restrictions Precautions Precautions: Fall Precaution Comments: recent carpal tunnel surgery 6/18 with soft cast in place, assume NWB? no orders in chart Restrictions Weight Bearing Restrictions: No      Mobility Bed Mobility Overal bed mobility: Needs Assistance Bed Mobility: Rolling Rolling: Max assist, Min assist         General bed mobility comments: patient was max A to roll to L side and min A to roll to R side.             ADL either performed or assessed with clinical judgement   ADL Overall ADL's : Needs assistance/impaired Eating/Feeding: Minimal assistance;Bed level   Grooming: Bed level;Moderate assistance   Upper Body Bathing: Bed level;Maximal assistance   Lower Body Bathing: Bed level;Total assistance   Upper Body Dressing : Bed level;Maximal assistance   Lower Body Dressing: Bed level;Total assistance     Toilet Transfer Details (indicate cue type and reason): patient's eval completed at bed level with patient in increased pain and fatigue impacting session. patient was max A to roll to L side and min A to roll to R side. Toileting- Clothing Manipulation and Hygiene: Total assistance;Bed level Toileting - Clothing Manipulation Details (indicate cue type and reason): whole bed was soiled upon entrance to room with patient td for hygiene tasks.       General ADL Comments: patients LUE was repoisitioned  with pillows aroudn patient to accomidate elevation wtih all positions in bed to  prevent further buildup of edema. patient requested warm blanket and was provided with one at this time. patient was educated on using ice for edema managemnet of L hand with patient reporting " no it will freeze me out". nurse made aware.                  Pertinent Vitals/Pain Pain Assessment Pain Assessment: Faces Faces Pain Scale: Hurts even more Pain Location: L hand at all times and LLE with movement Pain Descriptors / Indicators: Discomfort, Grimacing, Constant Pain Intervention(s): Limited activity within patient's tolerance, Monitored during session, Repositioned     Hand Dominance Right   Extremity/Trunk Assessment Upper Extremity Assessment Upper Extremity Assessment: Defer to OT evaluation LUE Deficits / Details: has soft cast in place with increasd edema in all digits on hand with education on keeping hand elevated provided. patient able to move shoulder and elbow with noted grimancing. carpal tunnel surgery on 6/18. LUE Coordination: decreased gross motor;decreased fine motor (recent carpal tunnel surgery)   Lower Extremity Assessment Lower Extremity Assessment: Generalized weakness;LLE deficits/detail;RLE deficits/detail;Difficult to assess due to impaired cognition RLE Deficits / Details: appears grossly WFL fro ROM; unable to test strength d/t lethargy LLE Deficits / Details: grimacing with knee AAROM; lethargy limiting testing       Communication Communication Communication: No difficulties   Cognition Arousal/Alertness: Lethargic Behavior During Therapy: Flat affect Overall Cognitive Status: Difficult to assess                                 General Comments: patient was lethargic with recent admistration of muscle relaxers.                Home Living Family/patient expects to be discharged to:: Private residence                             Home Equipment: Agricultural consultant (2 wheels);Rollator (4 wheels);Shower  seat;BSC/3in1          Prior Functioning/Environment Prior Level of Function : Independent/Modified Independent               ADLs Comments: patient reported being independent but was fatigued  and not able to provide more information. PLOF info taken from chart.        OT Problem List: Decreased safety awareness;Decreased knowledge of precautions;Impaired balance (sitting and/or standing);Decreased activity tolerance;Decreased cognition;Decreased knowledge of use of DME or AE;Pain;Impaired UE functional use;Increased edema      OT Treatment/Interventions:      OT Goals(Current goals can be found in the care plan section) Acute Rehab OT Goals Patient Stated Goal: to get a warm blanket OT Goal Formulation: Patient unable to participate in goal setting Time For Goal Achievement: 11/08/22 Potential to Achieve Goals: Fair  OT Frequency:      Co-evaluation PT/OT/SLP Co-Evaluation/Treatment: Yes Reason for Co-Treatment: To address functional/ADL transfers PT goals addressed during session: Mobility/safety with mobility OT goals addressed during session: ADL's and self-care      AM-PAC OT "6 Clicks" Daily Activity     Outcome Measure Help from another person eating meals?: A Lot Help from another person taking care of personal grooming?: A Lot Help from another person toileting, which includes using toliet, bedpan, or urinal?: Total Help from another person bathing (including washing, rinsing, drying)?: Total  Help from another person to put on and taking off regular upper body clothing?: Total Help from another person to put on and taking off regular lower body clothing?: Total 6 Click Score: 8   End of Session Nurse Communication: Other (comment) (ok to participate in session, update on participation in session)  Activity Tolerance: Patient limited by fatigue;Patient limited by pain Patient left: in bed;with call bell/phone within reach;with bed alarm set  OT Visit  Diagnosis: Pain Pain - Right/Left: Left Pain - part of body: Hand;Leg                Time: 1359-1417 OT Time Calculation (min): 18 min Charges:  OT General Charges $OT Visit: 1 Visit OT Evaluation $OT Eval Low Complexity: 1 Low  Derron Pipkins OTR/L, MS Acute Rehabilitation Department Office# [phone]   Selinda Flavin 10/25/2022, 3:18 PM

## 2022-10-26 DIAGNOSIS — E871 Hypo-osmolality and hyponatremia: Secondary | ICD-10-CM | POA: Diagnosis not present

## 2022-10-26 LAB — CBC WITH DIFFERENTIAL/PLATELET
Abs Immature Granulocytes: 0.03 10*3/uL (ref 0.00–0.07)
Basophils Absolute: 0 10*3/uL (ref 0.0–0.1)
Basophils Relative: 0 %
Eosinophils Absolute: 0.1 10*3/uL (ref 0.0–0.5)
Eosinophils Relative: 1 %
HCT: 30 % — ABNORMAL LOW (ref 36.0–46.0)
Hemoglobin: 10.2 g/dL — ABNORMAL LOW (ref 12.0–15.0)
Immature Granulocytes: 1 %
Lymphocytes Relative: 18 %
Lymphs Abs: 1.2 10*3/uL (ref 0.7–4.0)
MCH: 30.7 pg (ref 26.0–34.0)
MCHC: 34 g/dL (ref 30.0–36.0)
MCV: 90.4 fL (ref 80.0–100.0)
Monocytes Absolute: 0.6 10*3/uL (ref 0.1–1.0)
Monocytes Relative: 9 %
Neutro Abs: 4.6 10*3/uL (ref 1.7–7.7)
Neutrophils Relative %: 71 %
Platelets: 306 10*3/uL (ref 150–400)
RBC: 3.32 MIL/uL — ABNORMAL LOW (ref 3.87–5.11)
RDW: 13.7 % (ref 11.5–15.5)
WBC: 6.5 10*3/uL (ref 4.0–10.5)
nRBC: 0 % (ref 0.0–0.2)

## 2022-10-26 LAB — BASIC METABOLIC PANEL
Anion gap: 6 (ref 5–15)
BUN: 8 mg/dL (ref 8–23)
CO2: 22 mmol/L (ref 22–32)
Calcium: 7.4 mg/dL — ABNORMAL LOW (ref 8.9–10.3)
Chloride: 99 mmol/L (ref 98–111)
Creatinine, Ser: 0.57 mg/dL (ref 0.44–1.00)
GFR, Estimated: >60 mL/min (ref >60)
Glucose, Bld: 135 mg/dL — ABNORMAL HIGH (ref 70–99)
Potassium: 3.6 mmol/L (ref 3.5–5.1)
Sodium: 127 mmol/L — ABNORMAL LOW (ref 135–145)

## 2022-10-26 LAB — GLUCOSE, CAPILLARY
Glucose-Capillary: 118 mg/dL — ABNORMAL HIGH (ref 70–99)
Glucose-Capillary: 183 mg/dL — ABNORMAL HIGH (ref 70–99)
Glucose-Capillary: 169 mg/dL — ABNORMAL HIGH (ref 70–99)
Glucose-Capillary: 163 mg/dL — ABNORMAL HIGH (ref 70–99)

## 2022-10-26 MED ORDER — TRAMADOL HCL 50 MG PO TABS
50.0000 mg | ORAL_TABLET | Freq: Four times a day (QID) | ORAL | Status: DC | PRN
  Administered 2022-10-26 – 2022-10-27 (×5): 50 mg via ORAL
  Filled 2022-10-26 (×5): qty 1

## 2022-10-26 NOTE — TOC Initial Note (Signed)
Transition of Care [facility]) - Initial/Assessment Note    Patient Details  Name: [name] MRN: [id number] Date of Birth: [birth year]  Transition of Care [facility]) CM/SW Contact:    Howell Rucks, RN Phone Number: 10/26/2022, 12:11 PM  Clinical Narrative:  Met with pt at bedside to introduce role of TOC/NCM and review for dc planning, PT recommendation for short term rehab-SNF, pt unable to recall he had a PT session, pt gave permission to speak with her niece, [name]. NCM outreached to [place], introduced self and role of TOC/NCM, [name] agreeable to SNF,  prefers [facility]. Request for bed offers sent through SNF HUB, awaiting accepting bed offers. TOC will continue to follow.                         Patient Goals and CMS Choice            Expected Discharge Plan and Services                                              Prior Living Arrangements/Services                       Activities of Daily Living Home Assistive Devices/Equipment: Built-in shower seat, CBG Meter, Eyeglasses, Grab bars in shower, Grab bars around toilet, Walker (specify type) (rollator) ADL Screening (condition at time of admission) Patient's cognitive ability adequate to safely complete daily activities?: Yes Is the patient deaf or have difficulty hearing?: No Does the patient have difficulty seeing, even when wearing glasses/contacts?: No Does the patient have difficulty concentrating, remembering, or making decisions?: No Patient able to express need for assistance with ADLs?: Yes Does the patient have difficulty dressing or bathing?: Yes Independently performs ADLs?: No Communication: Independent Dressing (OT): Needs assistance Grooming: Needs assistance Is this a change from baseline?: Pre-admission baseline Feeding: Independent Bathing: Needs assistance Is this a change from baseline?: Pre-admission baseline Toileting: Needs assistance Is this a change from baseline?:  Change from baseline, expected to last <3 days In/Out Bed: Needs assistance Is this a change from baseline?: Change from baseline, expected to last <3 days Walks in Home: Independent with device (comment) Does the patient have difficulty walking or climbing stairs?: Yes Weakness of Legs: Both Weakness of Arms/Hands: Both  Permission Sought/Granted                  Emotional Assessment              Admission diagnosis:  Hyponatremia [E87.1] Left hand pain [M79.642] Generalized weakness [R53.1] Patient Active Problem List   Diagnosis Date Noted   SBO (small bowel obstruction) (HCC) 01/24/2022   Osteopenia 10/06/2021   Family history of malignant neoplasm of digestive organs 10/06/2021   History of malignant neoplasm of colon 10/06/2021   Hx of compression fracture of spine 10/06/2021   Intestinal malabsorption 10/06/2021   Pharyngeal dysphagia 10/06/2021   Carpal tunnel syndrome of left wrist 09/22/2021   CAP (community acquired pneumonia) 04/03/2021   Numbness of hand 01/13/2021   DDD (degenerative disc disease), cervical 12/03/2020   Compression fracture of body of thoracic vertebra (HCC) 08/21/2020   Dilantin toxicity 08/17/2020   Fracture of neck of femur (HCC) 07/11/2020   Postoperative anemia due to acute blood loss 06/06/2020   Leukocytosis  Closed right hip fracture (HCC) 06/04/2020   Constipation 05/27/2020   Dyslipidemia 05/27/2020   Enterocolitis due to Clostridium difficile, not specified as recurrent 05/27/2020   Pleural effusion on right 04/01/2020   Acute respiratory failure with hypoxia (HCC) 04/01/2020   Pneumonia of right lung due to infectious organism 04/01/2020   Uncontrolled type 2 diabetes mellitus with hyperglycemia, with long-term current use of insulin (HCC) 04/01/2020   Abnormal CT of the chest 04/01/2020   Subacute pulmonary embolism (HCC) 04/01/2020   Acute metabolic encephalopathy 04/01/2020   Adenocarcinoma of colon (HCC)  04/01/2020   Nausea & vomiting 03/12/2020   Cancer of ascending colon pT3pN0 (0/12 LN) s/p lap right colectomy 03/06/2020 03/09/2020   History of multiple allergies 03/09/2020   S/P right hemicolectomy 03/06/2020   Closed fracture of shaft of tibia 02/12/2020   Abnormal feces 12/11/2019   Preop cardiovascular exam 12/11/2019   Gait abnormality 12/04/2019   Impaired left ventricular function 09/05/2019   Pain in joint of left shoulder 02/27/2019   Hypokalemia    Effusion of left knee joint    Hyponatremia    Left patella fracture 10/26/2018   Fracture of patella 10/26/2018   Pain in left knee 09/29/2018   Acquired hypothyroidism 08/25/2017   H/O excision of tumor of brain meninges 08/25/2017   Hammer toe 08/25/2017   History of breast cancer 08/25/2017   Nontoxic thyroid nodule 08/25/2017   Osteopetrosis 08/25/2017   Age-related osteoporosis without current pathological fracture 08/25/2017   Dysuria 01/19/2017   Vitamin D deficiency 11/18/2016   Tremor, essential 08/18/2016   Closed burst fracture of lumbar vertebra (HCC) 11/25/2015   Seizure disorder (HCC) 10/16/2014   Chronic low back pain 12/21/2013   Cerebral meningioma (HCC) 12/21/2013   Brain tumor (benign) (HCC) 10/18/2013   Subdural hemorrhage (HCC) 10/09/2013   Anxiety 10/09/2013   Compression fracture of T12 vertebra (HCC) 10/09/2013   Nontoxic multinodular goiter 09/05/2013   Syncope 08/27/2013   Carotid artery disease (HCC) 08/27/2013   Abnormal thyroid ultrasound 08/27/2013   Cancer of central portion of female breast (HCC) 12/30/2012   Osteoporosis 03/06/2010   ASTHMA 03/05/2009   Asthma 03/05/2009   IRRITABLE BOWEL SYNDROME  diarrhea type 03/21/2008   ANEMIA-NOS 12/16/2006   GERD 12/16/2006   Insulin-requiring or dependent type II diabetes mellitus (HCC) 10/29/2006   Mixed hyperlipidemia 10/29/2006   Essential hypertension 10/29/2006   Allergic rhinitis, cause unspecified 10/29/2006   OSTEOARTHRITIS  10/29/2006   PCP:  Verlon Au, MD Pharmacy:   [facility] DELIVERY - [place], [place] - [address] [place] [address] Phone: [phone] Fax: [phone]     Social Determinants of Health (SDOH) Social History: SDOH Screenings   Food Insecurity: No Food Insecurity (10/24/2022)  Housing: Low Risk  (10/24/2022)  Transportation Needs: No Transportation Needs (10/24/2022)  Utilities: Not At Risk (10/24/2022)  Depression (PHQ2-9): Low Risk  (09/18/2020)  Tobacco Use: Low Risk  (10/24/2022)   SDOH Interventions:     Readmission Risk Interventions    01/29/2022   10:17 AM 06/06/2020    2:30 PM 03/15/2020   10:55 AM  Readmission Risk Prevention Plan  Transportation Screening Complete Complete Complete  PCP or Specialist Appt within 3-5 Days Complete  Complete  HRI or Home Care Consult Complete    Social Work Consult for Recovery Care Planning/Counseling Complete  Complete  Palliative Care Screening Complete  Complete  Medication Review Oceanographer) Complete  Complete  PCP or Specialist appointment within 3-5 days of discharge  Complete   SW Recovery Care/Counseling Consult  Complete   Palliative Care Screening  Not Applicable

## 2022-10-26 NOTE — Progress Notes (Signed)
PROGRESS NOTE  [name] ZOX:[id number] DOB: 01-08-1930 DOA: 10/24/2022 PCP: Verlon Au, MD  HPI/Recap of past 24 hours: [name] is a 89 y.o. female with medical history significant for DM, chronic hyponatremia, hypothyroidism, depression, who comes into the hospital with generalized weakness as well as significant diarrhea.  Symptoms have been present for the past couple of days, with associated poor oral intake. Of note, she recently had left hand carpal tunnel surgery on 10/20/2022 by Dr. Bradly Bienenstock from [facility], recommended postop follow up on 10/27/22. In the ED, vital signs stable, labs stable except for sodium of 116.  Patient admitted for further management given hyponatremia.    Today, patient denies any new complaints.  Discussed briefly about going to SNF as recommended.  Patient immediately began to cry uncontrollably.  Discussed with RN to consult chaplain.  Patient was concerned that if she went to SNF she will not come home again, although she recognizes that she needs SNF.  Expressed loneliness sometimes.   Assessment/Plan: Principal Problem:   Hyponatremia  Acute on chronic hyponatremia  Improving slowly Sodium level typically ranges around 126 and 134 Likely due to GI losses, poor oral intake TSH WNL Urine osmolality 257, urine sodium 20 Continue gentle IV hydration  Chronic diarrhea Improving GIP panel negative Continue PTA cholestyramine   Insulin-dependent diabetes mellitus Last A1c 6.9, well-controlled SSI, Semglee, Accu-Cheks, hypoglycemic protocol   Hypothyroidism TSH WNL  Resume Synthroid   Intermittent left lower extremity swelling  Bilateral vascular Doppler negative for DVT   Essential hypertension Continue home verapamil   Status post left hand carpal tunnel surgery Still with some swelling and pain Surgery on 10/20/2022 by Dr. Bradly Bienenstock from [facility], recommended postop follow up on 10/27/22 Pain management OT  consulted  Generalized weakness Likely multifactorial PT/OT-recommend SNF     Estimated body mass index is 22.24 kg/m as calculated from the following:   Height as of this encounter: 5\' 7"  (1.702 m).   Weight as of this encounter: 64.4 kg.     Code Status: Full  Family Communication: None at bedside  Disposition Plan: Status is: Inpatient Remains inpatient appropriate because: Level of care      Consultants: None  Procedures: None  Antimicrobials: None  DVT prophylaxis: Lovenox   Objective: Vitals:   10/25/22 2113 10/26/22 0119 10/26/22 0600 10/26/22 1020  BP: 121/66 117/64 130/65 133/68  Pulse: 73 77 72   Resp: 17 17 14    Temp: 98.3 F (36.8 C) 97.7 F (36.5 C) 97.6 F (36.4 C)   TempSrc: Oral Oral Oral   SpO2: 98% 93% 96%   Weight:      Height:        Intake/Output Summary (Last 24 hours) at 10/26/2022 1426 Last data filed at 10/26/2022 1200 Gross per 24 hour  Intake 1830 ml  Output 951 ml  Net 879 ml   Filed Weights   10/24/22 1600  Weight: 64.4 kg    Exam: General: NAD  Cardiovascular: S1, S2 present Respiratory: CTAB Abdomen: Soft, nontender, nondistended, bowel sounds present, reducible ventral hernia noted Musculoskeletal: No bilateral pedal edema noted, left hand swelling noted with pain, dressing clean/dry/intact Skin: Normal Psychiatry: Teary mood     Data Reviewed: CBC: Recent Labs  Lab 10/24/22 1233 10/26/22 0426  WBC 9.7 6.5  NEUTROABS 7.4 4.6  HGB 11.1* 10.2*  HCT 33.5* 30.0*  MCV 90.5 90.4  PLT 304 306   Basic Metabolic Panel: Recent Labs  Lab 10/24/22  1233 10/24/22 1648 10/24/22 1649 10/24/22 2253 10/25/22 0505 10/25/22 1226 10/25/22 2041 10/26/22 0426  NA 116* 120*   < > 122* 121* 125* 126* 127*  K 4.3 4.4  --   --   --   --   --  3.6  CL 88* 92*  --   --   --   --   --  99  CO2 19* 16*  --   --   --   --   --  22  GLUCOSE 201* 203*  --   --   --   --   --  135*  BUN 13 13  --   --   --   --   --   8  CREATININE 0.71 0.68  --   --   --   --   --  0.57  CALCIUM 8.0* 7.8*  --   --   --   --   --  7.4*   < > = values in this interval not displayed.   GFR: Estimated Creatinine Clearance: 45.5 mL/min (by C-G formula based on SCr of 0.57 mg/dL). Liver Function Tests: Recent Labs  Lab 10/24/22 1233  AST 15  ALT 14  ALKPHOS 87  BILITOT 0.5  PROT 6.4*  ALBUMIN 3.2*   No results for input(s): "LIPASE", "AMYLASE" in the last 168 hours. No results for input(s): "AMMONIA" in the last 168 hours. Coagulation Profile: No results for input(s): "INR", "PROTIME" in the last 168 hours. Cardiac Enzymes: No results for input(s): "CKTOTAL", "CKMB", "CKMBINDEX", "TROPONINI" in the last 168 hours. BNP (last 3 results) No results for input(s): "PROBNP" in the last 8760 hours. HbA1C: Recent Labs    10/24/22 1648  HGBA1C 6.9*   CBG: Recent Labs  Lab 10/25/22 1145 10/25/22 1720 10/25/22 2110 10/26/22 0734 10/26/22 1133  GLUCAP 192* 167* 158* 118* 183*   Lipid Profile: No results for input(s): "CHOL", "HDL", "LDLCALC", "TRIG", "CHOLHDL", "LDLDIRECT" in the last 72 hours. Thyroid Function Tests: Recent Labs    10/24/22 1320  TSH 2.959   Anemia Panel: No results for input(s): "VITAMINB12", "FOLATE", "FERRITIN", "TIBC", "IRON", "RETICCTPCT" in the last 72 hours. Urine analysis:    Component Value Date/Time   COLORURINE YELLOW 10/24/2022 2131   APPEARANCEUR CLEAR 10/24/2022 2131   LABSPEC 1.008 10/24/2022 2131   PHURINE 5.0 10/24/2022 2131   GLUCOSEU NEGATIVE 10/24/2022 2131   GLUCOSEU NEGATIVE 12/28/2013 1036   HGBUR NEGATIVE 10/24/2022 2131   HGBUR trace-intact 06/29/2007 1304   BILIRUBINUR NEGATIVE 10/24/2022 2131   BILIRUBINUR negative 01/19/2017 1049   KETONESUR NEGATIVE 10/24/2022 2131   PROTEINUR NEGATIVE 10/24/2022 2131   UROBILINOGEN 0.2 01/19/2017 1049   UROBILINOGEN 1.0 03/25/2015 1755   NITRITE NEGATIVE 10/24/2022 2131   LEUKOCYTESUR SMALL (A) 10/24/2022 2131    Sepsis Labs: @LABRCNTIP (procalcitonin:4,lacticidven:4)  ) Recent Results (from the past 240 hour(s))  MRSA Next Gen by PCR, Nasal     Status: None   Collection Time: 10/24/22  3:42 PM   Specimen: Nasal Mucosa; Nasal Swab  Result Value Ref Range Status   MRSA by PCR Next Gen NOT DETECTED NOT DETECTED Final    Comment: (NOTE) The GeneXpert MRSA Assay (FDA approved for NASAL specimens only), is one component of a comprehensive MRSA colonization surveillance program. It is not intended to diagnose MRSA infection nor to guide or monitor treatment for MRSA infections. Test performance is not FDA approved in patients less than 83 years old. Performed at [facility], [address] [place], [place] [address]   Gastrointestinal Panel by PCR , Stool     Status: None   Collection Time: 10/25/22  9:21 AM   Specimen: Stool  Result Value Ref Range Status   Campylobacter species NOT DETECTED NOT DETECTED Final   Plesimonas shigelloides NOT DETECTED NOT DETECTED Final   Salmonella species NOT DETECTED NOT DETECTED Final   Yersinia enterocolitica NOT DETECTED NOT DETECTED Final   Vibrio species NOT DETECTED NOT DETECTED Final   Vibrio cholerae NOT DETECTED NOT DETECTED Final   Enteroaggregative E coli (EAEC) NOT DETECTED NOT DETECTED Final   Enteropathogenic E coli (EPEC) NOT DETECTED NOT DETECTED Final   Enterotoxigenic E coli (ETEC) NOT DETECTED NOT DETECTED Final   Shiga like toxin producing E coli (STEC) NOT DETECTED NOT DETECTED Final   Shigella/Enteroinvasive E coli (EIEC) NOT DETECTED NOT DETECTED Final   Cryptosporidium NOT DETECTED NOT DETECTED Final   Cyclospora cayetanensis NOT DETECTED NOT DETECTED Final   Entamoeba histolytica NOT DETECTED NOT DETECTED Final   Giardia lamblia NOT DETECTED NOT DETECTED Final   Adenovirus F40/41 NOT DETECTED NOT DETECTED Final   Astrovirus NOT DETECTED NOT DETECTED Final   Norovirus GI/GII NOT DETECTED NOT DETECTED Final    Rotavirus A NOT DETECTED NOT DETECTED Final   Sapovirus (I, II, IV, and V) NOT DETECTED NOT DETECTED Final    Comment: Performed at [facility], [address], [place], [place] [address]      Studies: No results found.  Scheduled Meds:  cholestyramine light  4 g Oral BID   enoxaparin (LOVENOX) injection  40 mg Subcutaneous QHS   insulin aspart  0-5 Units Subcutaneous QHS   insulin aspart  0-9 Units Subcutaneous TID WC   insulin glargine-yfgn  8 Units Subcutaneous Daily   levothyroxine  75 mcg Oral QAC breakfast   pantoprazole  40 mg Oral Daily   phenytoin  300 mg Oral Daily   primidone  150 mg Oral QHS   primidone  250 mg Oral Daily   verapamil  180 mg Oral Daily    Continuous Infusions:  sodium chloride 75 mL/hr at 10/26/22 1200     LOS: 2 days     Briant Cedar, MD [facility]  If 7PM-7AM, please contact night-coverage [email or web address] 10/26/2022, 2:26 PM

## 2022-10-26 NOTE — Progress Notes (Signed)
Occupational Therapy Treatment Patient Details Name: [name] MRN: [id number] DOB: 01-09-1930 Today's Date: 10/26/2022   History of present illness Patient is a 89 year old female who presented to the hospital on 6/22 with weakness and significant diarrhea for past few days. Of note patient had recent carpal tunnel surgery on L wrist on 6/18.patient was admitted with hyponatremia, and hypothyroidism.  PMH: breast cancer, carpal tunnel syndrome bilaterally, anemia, asthma, osteoporosis, R THA,   OT comments  Patient was noted to make improvements towards goals. Patients sister was present during session reporting that patient was MI at rollator level prior to surgery with family support since surgery. Patient was able to transfer with mod A for initial stand with cues for sequencing of task to maintain NWB status on L wrist. Patient was mod/max for sit to stand with min A to complete transfer with L platform walker.  Patient was TD for hygiene tasks and noted to have continued poor functional activity tolerance. Patient was educated on strategies for keeping LUE elevated to reduce edema. Patient verbalized understanding. Patient's discharge plan remains appropriate at this time. OT will continue to follow acutely.     Recommendations for follow up therapy are one component of a multi-disciplinary discharge planning process, led by the attending physician.  Recommendations Fluty be updated based on patient status, additional functional criteria and insurance authorization.    Assistance Recommended at Discharge Frequent or constant Supervision/Assistance  Patient can return home with the following  Two people to help with walking and/or transfers;Two people to help with bathing/dressing/bathroom;Assistance with cooking/housework;Direct supervision/assist for medications management;Assist for transportation;Help with stairs or ramp for entrance;Direct supervision/assist for financial management    Equipment Recommendations  None recommended by OT       Precautions / Restrictions Precautions Precautions: Fall Precaution Comments: recent carpal tunnel surgery 6/18 with soft cast in place, assume NWB? no orders in chart Restrictions Weight Bearing Restrictions: No       Mobility Bed Mobility Overal bed mobility: Needs Assistance Bed Mobility: Supine to Sit     Supine to sit: Mod assist     General bed mobility comments: with HOB raised and increased time         Balance Overall balance assessment: Needs assistance Sitting-balance support: Feet supported, Single extremity supported Sitting balance-Leahy Scale: Fair     Standing balance support: Reliant on assistive device for balance, Bilateral upper extremity supported, During functional activity Standing balance-Leahy Scale: Poor             ADL either performed or assessed with clinical judgement   ADL Overall ADL's : Needs assistance/impaired           Toilet Transfer: Rolling walker (2 wheels);Stand-pivot;Maximal assistance Toilet Transfer Details (indicate cue type and reason): to [facility] with increased time. patient needed mod A with HOB raised and cues for leaning nose over toes. patient needed increased A to stand up from [facility] and cues to keep LUE across chest. Toileting- Clothing Manipulation and Hygiene: Total assistance;Sit to/from stand Toileting - Clothing Manipulation Details (indicate cue type and reason): with cues for proper placement of hands to maintain standing balance. patient reported not feeling "fully clean" after intial washing up with NT called into room and more wash cloths obtained and patient was washed down again. patient repeatedly telling therapist that she wanted to be washed down again during the set up of second wash up with patient re educated on needing to get materials and that this therapist  would not leave patient until she felt clean.       General ADL Comments: patient  was more alert today and patients sister was present. patient and sister reported that patient uses rollator at baseline and was liivng at home with support from family. patient and sister unable to communicate how patient was planning to get around house at rollator level when she is not allowed to use L wrist. patients sister reported " well she came home in a wheelchair" when asked if the plan was to use a wheelchair until follow up with ortho, patient reported " i dont know what the plan was going to be". patient was provided with platform walker on L side to increase time out of bed. patients sister reported " i hope you dont think she is going to walk". patients sister was educated on therapy process and purpose of trying and ability to get more staff support if needed to remain safe. patients sister verbalized understanding. patient at end of session was provided with ice pack to reduce edema and pain as patient was not due for more medication at this time. patient's sister reported " dont put that ice pack near her incisions, my ortho told me to never do that when i had my knee done". patients wrist remains insulated in soft cast at this time and ice pack was not placed near incision.  patient unable to tolerate ice pack ontop of hand with ice pack leaned on side with weight off of L hand/forearm.    Extremity/Trunk Assessment Upper Extremity Assessment Upper Extremity Assessment: LUE deficits/detail LUE Deficits / Details: soft cast still in place with continued edema in L digits with education on keeping arm elevated.             Cognition Arousal/Alertness: Awake/alert Behavior During Therapy: Flat affect Overall Cognitive Status: Difficult to assess           General Comments: patients sister in room with input on whole sesssion as well as patient.                   Pertinent Vitals/ Pain       Pain Assessment Pain Assessment: Faces Faces Pain Scale: Hurts even more Pain  Location: L hand at all times Pain Descriptors / Indicators: Discomfort, Grimacing, Constant Pain Intervention(s): Limited activity within patient's tolerance, Monitored during session, Repositioned, Premedicated before session, Patient requesting pain meds-RN notified, Ice applied         Frequency  Min 1X/week        Progress Toward Goals  OT Goals(current goals can now be found in the care plan section)  Progress towards OT goals: Progressing toward goals     Plan Discharge plan remains appropriate       AM-PAC OT "6 Clicks" Daily Activity     Outcome Measure   Help from another person eating meals?: A Little Help from another person taking care of personal grooming?: A Lot Help from another person toileting, which includes using toliet, bedpan, or urinal?: A Lot Help from another person bathing (including washing, rinsing, drying)?: A Lot Help from another person to put on and taking off regular upper body clothing?: A Lot Help from another person to put on and taking off regular lower body clothing?: A Lot 6 Click Score: 13    End of Session Equipment Utilized During Treatment: Gait belt;Other (comment) (L platform walker)  OT Visit Diagnosis: Pain;Muscle weakness (generalized) (M62.81);Unsteadiness on feet (R26.81);Other abnormalities of  gait and mobility (R26.89) Pain - Right/Left: Left Pain - part of body: Hand;Arm   Activity Tolerance Patient limited by fatigue;Patient limited by pain   Patient Left with call bell/phone within reach;in chair;with chair alarm set   Nurse Communication Other (comment);Patient requests pain meds (ok to participate in session, updated on participation after session)        Time: 1346-1430 OT Time Calculation (min): 44 min  Charges: OT General Charges $OT Visit: 1 Visit OT Treatments $Self Care/Home Management : 38-52 mins  Rosalio Loud, MS Acute Rehabilitation Department Office# [phone]   Selinda Flavin 10/26/2022, 4:24 PM

## 2022-10-26 NOTE — Progress Notes (Signed)
Chaplain engaged in an initial visit with [name]. [name] was very tearful as she expressed that she does not want to go to a "nursing home." She declared that she fears that she will never return home. Chaplain assessed that [name] has been processing her loss of independence and health. [name] also expressed valuing being home which is her place of comfort. The many transitions happening in her life and health have created significant grief.   [name] also talked about her support system and how everyone is "busy" in her family. Chaplain assessed that [name] has also felt lonely and without community. She recognizes that she doesn't have familial support to be at home by herself.   Chaplain offered supportive and compassionate presence with reflective listening. Chaplain also offered prayer over [name].   Chaplain Emeterio Balke, MDiv  10/26/22 1000  Spiritual Encounters  Type of Visit Initial  Reason for visit Routine spiritual support  Spiritual Framework  Presenting Themes Impactful experiences and emotions;Significant life change;Goals in life/care;Rituals and practive;Community and relationships  Patient Stress Factors Loss of control;Major life changes;Health changes  Interventions  Spiritual Care Interventions Made Prayer;Supported grief process;Encouragement;Normalization of emotions;Reflective listening;Compassionate presence;Established relationship of care and support  Intervention Outcomes  Outcomes Awareness of support;Connection to values and goals of care;Connection to spiritual care;Reduced fear

## 2022-10-26 NOTE — Plan of Care (Signed)
  Problem: Education: Goal: Ability to describe self-care measures that Theiler prevent or decrease complications (Diabetes Survival Skills Education) will improve Outcome: Progressing Goal: Individualized Educational Video(s) Outcome: Progressing   

## 2022-10-27 DIAGNOSIS — E871 Hypo-osmolality and hyponatremia: Secondary | ICD-10-CM | POA: Diagnosis not present

## 2022-10-27 LAB — BASIC METABOLIC PANEL
Anion gap: 7 (ref 5–15)
BUN: 5 mg/dL — ABNORMAL LOW (ref 8–23)
CO2: 17 mmol/L — ABNORMAL LOW (ref 22–32)
Calcium: 7 mg/dL — ABNORMAL LOW (ref 8.9–10.3)
Chloride: 101 mmol/L (ref 98–111)
Creatinine, Ser: 0.49 mg/dL (ref 0.44–1.00)
GFR, Estimated: >60 mL/min (ref >60)
Glucose, Bld: 120 mg/dL — ABNORMAL HIGH (ref 70–99)
Potassium: 3.9 mmol/L (ref 3.5–5.1)
Sodium: 125 mmol/L — ABNORMAL LOW (ref 135–145)

## 2022-10-27 LAB — SODIUM, URINE, RANDOM: Sodium, Ur: 55 mmol/L

## 2022-10-27 LAB — OSMOLALITY, URINE: Osmolality, Ur: 167 mOsm/kg — ABNORMAL LOW (ref 300–900)

## 2022-10-27 LAB — GLUCOSE, CAPILLARY
Glucose-Capillary: 120 mg/dL — ABNORMAL HIGH (ref 70–99)
Glucose-Capillary: 147 mg/dL — ABNORMAL HIGH (ref 70–99)
Glucose-Capillary: 128 mg/dL — ABNORMAL HIGH (ref 70–99)
Glucose-Capillary: 98 mg/dL (ref 70–99)

## 2022-10-27 MED ORDER — POLYVINYL ALCOHOL 1.4 % OP SOLN
1.0000 [drp] | OPHTHALMIC | Status: DC | PRN
  Administered 2022-10-27: 1 [drp] via OPHTHALMIC
  Filled 2022-10-27: qty 15

## 2022-10-27 MED ORDER — OXYCODONE HCL 5 MG PO TABS
5.0000 mg | ORAL_TABLET | Freq: Once | ORAL | Status: AC
  Administered 2022-10-27: 5 mg via ORAL
  Filled 2022-10-27: qty 1

## 2022-10-27 MED ORDER — CARMEX CLASSIC LIP BALM EX OINT
TOPICAL_OINTMENT | CUTANEOUS | Status: DC | PRN
  Filled 2022-10-27: qty 10

## 2022-10-27 MED ORDER — OXYCODONE HCL 5 MG PO TABS
5.0000 mg | ORAL_TABLET | Freq: Four times a day (QID) | ORAL | Status: DC | PRN
  Administered 2022-10-27 – 2022-10-28 (×2): 5 mg via ORAL
  Filled 2022-10-27 (×2): qty 1

## 2022-10-27 MED ORDER — SODIUM CHLORIDE 0.9 % IV SOLN: INTRAVENOUS | Status: DC

## 2022-10-27 MED ORDER — DICLOFENAC SODIUM 1 % EX GEL
2.0000 g | Freq: Four times a day (QID) | CUTANEOUS | Status: DC | PRN
  Administered 2022-10-28 – 2022-10-29 (×3): 2 g via TOPICAL
  Filled 2022-10-27: qty 100

## 2022-10-27 NOTE — TOC Progression Note (Signed)
Transition of Care [facility]) - Progression Note    Patient Details  Name: [name] MRN: [id number] Date of Birth: 01/03/1930  [facility] CM/SW Contact  Howell Rucks, RN Phone Number: 10/27/2022, 12:32 PM  Clinical Narrative:  Met with pt at bedside to present bed offers, pt agreeable to [facility], list of bed offers left at pt's bedside. Pt had no questions/concerns.  TOC will continue to follow.     Expected Discharge Plan: Skilled Nursing Facility Barriers to Discharge: Continued Medical Work up  Expected Discharge Plan and Services       Living arrangements for the past 2 months: Single Family Home                                       Social Determinants of Health (SDOH) Interventions SDOH Screenings   Food Insecurity: No Food Insecurity (10/24/2022)  Housing: Low Risk  (10/24/2022)  Transportation Needs: No Transportation Needs (10/24/2022)  Utilities: Not At Risk (10/24/2022)  Depression (PHQ2-9): Low Risk  (09/18/2020)  Tobacco Use: Low Risk  (10/24/2022)    Readmission Risk Interventions    10/27/2022   12:30 PM 01/29/2022   10:17 AM 06/06/2020    2:30 PM  Readmission Risk Prevention Plan  Transportation Screening Complete Complete Complete  PCP or Specialist Appt within 3-5 Days Complete Complete   HRI or Home Care Consult Complete Complete   Social Work Consult for Recovery Care Planning/Counseling Complete Complete   Palliative Care Screening  Complete   Medication Review Oceanographer) Complete Complete   PCP or Specialist appointment within 3-5 days of discharge   Complete  SW Recovery Care/Counseling Consult   Complete  Palliative Care Screening   Not Applicable

## 2022-10-27 NOTE — Progress Notes (Addendum)
PROGRESS NOTE  [name] YQM:[id number] DOB: 1929-10-28 DOA: 10/24/2022 PCP: Verlon Au, MD  HPI/Recap of past 24 hours: [name] is a 89 y.o. female with medical history significant for DM, chronic hyponatremia, hypothyroidism, depression, who comes into the hospital with generalized weakness as well as significant diarrhea.  Symptoms have been present for the past couple of days, with associated poor oral intake. Of note, she recently had left hand carpal tunnel surgery on 10/20/2022 by Dr. Bradly Bienenstock from [facility], recommended postop follow up on 10/27/22. In the ED, vital signs stable, labs stable except for sodium of 116.  Patient admitted for further management given hyponatremia.    Today, patient complaining of significant pain especially in her left hand.  Complaining of also generalized back and knee pain.  Discussed with sister at bedside.   Assessment/Plan: Principal Problem:   Hyponatremia   Acute on chronic hyponatremia  Improving slowly Sodium level typically ranges around 126 and 134 Likely due to GI losses, poor oral intake TSH WNL Urine osmolality 257, urine sodium 20 Discussed with Dr. Arlean Hopping from nephrology on 6/25, recommend continuing IV fluids and repeating urine lites (would want sodium to be 130 and above prior to discharge) Continue gentle IV hydration  Chronic diarrhea Improving GIP panel negative Continue PTA cholestyramine   Insulin-dependent diabetes mellitus, with hyperglycemia Last A1c 6.9 SSI, Semglee, Accu-Cheks, hypoglycemic protocol   Hypothyroidism TSH WNL  Resume Synthroid   Intermittent left lower extremity swelling  Bilateral vascular Doppler negative for DVT   Essential hypertension Continue home verapamil   Status post left hand carpal tunnel surgery Still with some swelling and pain Surgery on 10/20/2022 by Dr. Bradly Bienenstock from [facility], recommended postop follow up on 10/27/22, contacted [facility] on 6/24  and 6/25 to let him know patient is here at [facility] long Pain management OT consulted  Generalized weakness Likely multifactorial PT/OT-recommend SNF     Estimated body mass index is 22.24 kg/m as calculated from the following:   Height as of this encounter: 5\' 7"  (1.702 m).   Weight as of this encounter: 64.4 kg.     Code Status: Full  Family Communication: Sister at bedside  Disposition Plan: Status is: Inpatient Remains inpatient appropriate because: Level of care      Consultants: None  Procedures: None  Antimicrobials: None  DVT prophylaxis: Lovenox   Objective: Vitals:   10/26/22 1800 10/26/22 2037 10/27/22 0530 10/27/22 1100  BP: (!) 148/66 133/64 122/64 (!) 141/65  Pulse: 70 65 65   Resp: 20 16 14 16   Temp: 98.6 F (37 C) 98.7 F (37.1 C) 98.7 F (37.1 C) 98.1 F (36.7 C)  TempSrc: Oral Oral Oral Oral  SpO2: 100% 97% 96% 96%  Weight:      Height:        Intake/Output Summary (Last 24 hours) at 10/27/2022 1711 Last data filed at 10/27/2022 1400 Gross per 24 hour  Intake 2273.28 ml  Output 2150 ml  Net 123.28 ml   Filed Weights   10/24/22 1600  Weight: 64.4 kg    Exam: General: NAD  Cardiovascular: S1, S2 present Respiratory: CTAB Abdomen: Soft, nontender, nondistended, bowel sounds present, reducible ventral hernia noted Musculoskeletal: No bilateral pedal edema noted, left hand swelling noted with pain, dressing clean/dry/intact Skin: Normal Psychiatry: Normal mood     Data Reviewed: CBC: Recent Labs  Lab 10/24/22 1233 10/26/22 0426  WBC 9.7 6.5  NEUTROABS 7.4 4.6  HGB 11.1* 10.2*  HCT  33.5* 30.0*  MCV 90.5 90.4  PLT 304 306   Basic Metabolic Panel: Recent Labs  Lab 10/24/22 1233 10/24/22 1648 10/24/22 1649 10/25/22 0505 10/25/22 1226 10/25/22 2041 10/26/22 0426 10/27/22 0454  NA 116* 120*   < > 121* 125* 126* 127* 125*  K 4.3 4.4  --   --   --   --  3.6 3.9  CL 88* 92*  --   --   --   --  99 101  CO2 19*  16*  --   --   --   --  22 17*  GLUCOSE 201* 203*  --   --   --   --  135* 120*  BUN 13 13  --   --   --   --  8 5*  CREATININE 0.71 0.68  --   --   --   --  0.57 0.49  CALCIUM 8.0* 7.8*  --   --   --   --  7.4* 7.0*   < > = values in this interval not displayed.   GFR: Estimated Creatinine Clearance: 45.5 mL/min (by C-G formula based on SCr of 0.49 mg/dL). Liver Function Tests: Recent Labs  Lab 10/24/22 1233  AST 15  ALT 14  ALKPHOS 87  BILITOT 0.5  PROT 6.4*  ALBUMIN 3.2*   No results for input(s): "LIPASE", "AMYLASE" in the last 168 hours. No results for input(s): "AMMONIA" in the last 168 hours. Coagulation Profile: No results for input(s): "INR", "PROTIME" in the last 168 hours. Cardiac Enzymes: No results for input(s): "CKTOTAL", "CKMB", "CKMBINDEX", "TROPONINI" in the last 168 hours. BNP (last 3 results) No results for input(s): "PROBNP" in the last 8760 hours. HbA1C: No results for input(s): "HGBA1C" in the last 72 hours.  CBG: Recent Labs  Lab 10/26/22 1634 10/26/22 2035 10/27/22 0734 10/27/22 1226 10/27/22 1701  GLUCAP 169* 163* 120* 147* 128*   Lipid Profile: No results for input(s): "CHOL", "HDL", "LDLCALC", "TRIG", "CHOLHDL", "LDLDIRECT" in the last 72 hours. Thyroid Function Tests: No results for input(s): "TSH", "T4TOTAL", "FREET4", "T3FREE", "THYROIDAB" in the last 72 hours.  Anemia Panel: No results for input(s): "VITAMINB12", "FOLATE", "FERRITIN", "TIBC", "IRON", "RETICCTPCT" in the last 72 hours. Urine analysis:    Component Value Date/Time   COLORURINE YELLOW 10/24/2022 2131   APPEARANCEUR CLEAR 10/24/2022 2131   LABSPEC 1.008 10/24/2022 2131   PHURINE 5.0 10/24/2022 2131   GLUCOSEU NEGATIVE 10/24/2022 2131   GLUCOSEU NEGATIVE 12/28/2013 1036   HGBUR NEGATIVE 10/24/2022 2131   HGBUR trace-intact 06/29/2007 1304   BILIRUBINUR NEGATIVE 10/24/2022 2131   BILIRUBINUR negative 01/19/2017 1049   KETONESUR NEGATIVE 10/24/2022 2131   PROTEINUR  NEGATIVE 10/24/2022 2131   UROBILINOGEN 0.2 01/19/2017 1049   UROBILINOGEN 1.0 03/25/2015 1755   NITRITE NEGATIVE 10/24/2022 2131   LEUKOCYTESUR SMALL (A) 10/24/2022 2131   Sepsis Labs: @LABRCNTIP (procalcitonin:4,lacticidven:4)  ) Recent Results (from the past 240 hour(s))  MRSA Next Gen by PCR, Nasal     Status: None   Collection Time: 10/24/22  3:42 PM   Specimen: Nasal Mucosa; Nasal Swab  Result Value Ref Range Status   MRSA by PCR Next Gen NOT DETECTED NOT DETECTED Final    Comment: (NOTE) The GeneXpert MRSA Assay (FDA approved for NASAL specimens only), is one component of a comprehensive MRSA colonization surveillance program. It is not intended to diagnose MRSA infection nor to guide or monitor treatment for MRSA infections. Test performance is not FDA approved in patients less  than 28 years old. Performed at [facility], [address], [place], [place] [address]   Gastrointestinal Panel by PCR , Stool     Status: None   Collection Time: 10/25/22  9:21 AM   Specimen: Stool  Result Value Ref Range Status   Campylobacter species NOT DETECTED NOT DETECTED Final   Plesimonas shigelloides NOT DETECTED NOT DETECTED Final   Salmonella species NOT DETECTED NOT DETECTED Final   Yersinia enterocolitica NOT DETECTED NOT DETECTED Final   Vibrio species NOT DETECTED NOT DETECTED Final   Vibrio cholerae NOT DETECTED NOT DETECTED Final   Enteroaggregative E coli (EAEC) NOT DETECTED NOT DETECTED Final   Enteropathogenic E coli (EPEC) NOT DETECTED NOT DETECTED Final   Enterotoxigenic E coli (ETEC) NOT DETECTED NOT DETECTED Final   Shiga like toxin producing E coli (STEC) NOT DETECTED NOT DETECTED Final   Shigella/Enteroinvasive E coli (EIEC) NOT DETECTED NOT DETECTED Final   Cryptosporidium NOT DETECTED NOT DETECTED Final   Cyclospora cayetanensis NOT DETECTED NOT DETECTED Final   Entamoeba histolytica NOT DETECTED NOT DETECTED Final   Giardia lamblia NOT  DETECTED NOT DETECTED Final   Adenovirus F40/41 NOT DETECTED NOT DETECTED Final   Astrovirus NOT DETECTED NOT DETECTED Final   Norovirus GI/GII NOT DETECTED NOT DETECTED Final   Rotavirus A NOT DETECTED NOT DETECTED Final   Sapovirus (I, II, IV, and V) NOT DETECTED NOT DETECTED Final    Comment: Performed at [facility], [address] [place], [place] [address]      Studies: No results found.  Scheduled Meds:  cholestyramine light  4 g Oral BID   enoxaparin (LOVENOX) injection  40 mg Subcutaneous QHS   insulin aspart  0-5 Units Subcutaneous QHS   insulin aspart  0-9 Units Subcutaneous TID WC   insulin glargine-yfgn  8 Units Subcutaneous Daily   levothyroxine  75 mcg Oral QAC breakfast   pantoprazole  40 mg Oral Daily   phenytoin  300 mg Oral Daily   primidone  150 mg Oral QHS   primidone  250 mg Oral Daily   verapamil  180 mg Oral Daily    Continuous Infusions:  sodium chloride 75 mL/hr at 10/27/22 0917     LOS: 3 days     Briant Cedar, MD [facility]  If 7PM-7AM, please contact night-coverage [email or web address] 10/27/2022, 5:11 PM

## 2022-10-27 NOTE — [place] FL2 (Signed)
[place] MEDICAID FL2 LEVEL OF CARE FORM     IDENTIFICATION  Patient Name: [name] Birthdate: 11-16-1929 Sex: female Admission Date (Current Location): 10/24/2022  [facility] and [place] Number:  Producer, television/film/video and Address:  [facility],  [address] [place], [place] [address]      Provider Number: [id number]  Attending Physician Name and Address:  Briant Cedar, MD  Relative Name and Phone Number:  [name]( Niece) [phone]    Current Level of Care: Hospital Recommended Level of Care: Skilled Nursing Facility Prior Approval Number:    Date Approved/Denied:   PASRR Number: [id number]  Discharge Plan: SNF    Current Diagnoses: Patient Active Problem List   Diagnosis Date Noted   SBO (small bowel obstruction) (HCC) 01/24/2022   Osteopenia 10/06/2021   Family history of malignant neoplasm of digestive organs 10/06/2021   History of malignant neoplasm of colon 10/06/2021   Hx of compression fracture of spine 10/06/2021   Intestinal malabsorption 10/06/2021   Pharyngeal dysphagia 10/06/2021   Carpal tunnel syndrome of left wrist 09/22/2021   CAP (community acquired pneumonia) 04/03/2021   Numbness of hand 01/13/2021   DDD (degenerative disc disease), cervical 12/03/2020   Compression fracture of body of thoracic vertebra (HCC) 08/21/2020   Dilantin toxicity 08/17/2020   Fracture of neck of femur (HCC) 07/11/2020   Postoperative anemia due to acute blood loss 06/06/2020   Leukocytosis    Closed right hip fracture (HCC) 06/04/2020   Constipation 05/27/2020   Dyslipidemia 05/27/2020   Enterocolitis due to Clostridium difficile, not specified as recurrent 05/27/2020   Pleural effusion on right 04/01/2020   Acute respiratory failure with hypoxia (HCC) 04/01/2020   Pneumonia of right lung due to infectious organism 04/01/2020   Uncontrolled type 2 diabetes mellitus with hyperglycemia, with long-term current use of insulin (HCC)  04/01/2020   Abnormal CT of the chest 04/01/2020   Subacute pulmonary embolism (HCC) 04/01/2020   Acute metabolic encephalopathy 04/01/2020   Adenocarcinoma of colon (HCC) 04/01/2020   Nausea & vomiting 03/12/2020   Cancer of ascending colon pT3pN0 (0/12 LN) s/p lap right colectomy 03/06/2020 03/09/2020   History of multiple allergies 03/09/2020   S/P right hemicolectomy 03/06/2020   Closed fracture of shaft of tibia 02/12/2020   Abnormal feces 12/11/2019   Preop cardiovascular exam 12/11/2019   Gait abnormality 12/04/2019   Impaired left ventricular function 09/05/2019   Pain in joint of left shoulder 02/27/2019   Hypokalemia    Effusion of left knee joint    Hyponatremia    Left patella fracture 10/26/2018   Fracture of patella 10/26/2018   Pain in left knee 09/29/2018   Acquired hypothyroidism 08/25/2017   H/O excision of tumor of brain meninges 08/25/2017   Hammer toe 08/25/2017   History of breast cancer 08/25/2017   Nontoxic thyroid nodule 08/25/2017   Osteopetrosis 08/25/2017   Age-related osteoporosis without current pathological fracture 08/25/2017   Dysuria 01/19/2017   Vitamin D deficiency 11/18/2016   Tremor, essential 08/18/2016   Closed burst fracture of lumbar vertebra (HCC) 11/25/2015   Seizure disorder (HCC) 10/16/2014   Chronic low back pain 12/21/2013   Cerebral meningioma (HCC) 12/21/2013   Brain tumor (benign) (HCC) 10/18/2013   Subdural hemorrhage (HCC) 10/09/2013   Anxiety 10/09/2013   Compression fracture of T12 vertebra (HCC) 10/09/2013   Nontoxic multinodular goiter 09/05/2013   Syncope 08/27/2013   Carotid artery disease (HCC) 08/27/2013   Abnormal thyroid ultrasound 08/27/2013   Cancer  of central portion of female breast (HCC) 12/30/2012   Osteoporosis 03/06/2010   ASTHMA 03/05/2009   Asthma 03/05/2009   IRRITABLE BOWEL SYNDROME  diarrhea type 03/21/2008   ANEMIA-NOS 12/16/2006   GERD 12/16/2006   Insulin-requiring or dependent type II  diabetes mellitus (HCC) 10/29/2006   Mixed hyperlipidemia 10/29/2006   Essential hypertension 10/29/2006   Allergic rhinitis, cause unspecified 10/29/2006   OSTEOARTHRITIS 10/29/2006    Orientation RESPIRATION BLADDER Height & Weight     Self, Time, Situation, Place  Normal Incontinent, External catheter Weight: 64.4 kg Height:  5\' 7"  (170.2 cm)  BEHAVIORAL SYMPTOMS/MOOD NEUROLOGICAL BOWEL NUTRITION STATUS      Continent Diet (regular diet)  AMBULATORY STATUS COMMUNICATION OF NEEDS Skin   Extensive Assist Verbally Other (Comment) (Erythema bilateral buttocks , ace wrap to LUE)                       Personal Care Assistance Level of Assistance  Bathing, Feeding, Dressing Bathing Assistance: Maximum assistance Feeding assistance: Maximum assistance Dressing Assistance: Maximum assistance     Functional Limitations Info  Sight, Hearing, Speech Sight Info: Adequate Hearing Info: Adequate Speech Info: Adequate    SPECIAL CARE FACTORS FREQUENCY  PT (By licensed PT), OT (By licensed OT)     PT Frequency: 5x/wk OT Frequency: 5x/wk            Contractures Contractures Info: Not present    Additional Factors Info  Code Status, Allergies, Psychotropic Code Status Info: Full Code Allergies Info: Bystolic (Nebivolol Hcl), Carbamazepine, Cholestyramine, Morphine, Niacin, Norvasc (Amlodipine Besylate), Optivar (Azelastine Hcl), Repaglinide, Sular (Nisoldipine Er), Telmisartan, Amoxicillin-pot Clavulanate, Cefdinir, Ciprofloxacin Hcl, Clonidine, Codeine, Ezetimibe, Hydroxychloroquine, Naproxen, Sulfa Antibiotics, Ziac (Bisoprolol-hydrochlorothiazide), Amlodipine Besy-benazepril Hcl, Azelastine, Elavil (Amitriptyline), Empagliflozin, Hydralazine, Iodine I 131 Tositumomab, Kenalog (Triamcinolone), Keppra (Levetiracetam), Lamotrigine, Pregabalin, Pseudoephedrine, Pseudoephedrine-guaifenesin Er, Risedronate, Ru-hist D (Brompheniramine-phenylephrine), Sumatriptan, Topiramate, Ace  Inhibitors, Aspirin, Atacand (Candesartan), Bextra (Valdecoxib), Bisoprolol-hydrochlorothiazide, Cefadroxil, Celecoxib, Hydrocodone, Hydrocodone-acetaminophen, Iodinated Contrast Media, Meloxicam, Methylprednisolone Sodium Succinate, Nabumetone, Penicillins, Pseudoephedrine-guaifenesin, Rofecoxib, Ru-tuss (Chlorphen-pse-atrop-hyos-scop), Sulfonamide Derivatives, Sulphur (Elemental Sulfur), Telithromycin, Terfenadine, Trandolapril-verapamil Hcl Er, Valium (Diazepam) Psychotropic Info: Xanax 0.125mg  po prn         Current Medications (10/27/2022):  This is the current hospital active medication list Current Facility-Administered Medications  Medication Dose Route Frequency Provider Last Rate Last Admin   0.9 %  sodium chloride infusion   Intravenous Continuous Briant Cedar, MD 75 mL/hr at 10/27/22 0917 Restarted at 10/27/22 1610   acetaminophen (TYLENOL) tablet 650 mg  650 mg Oral Q6H PRN Leatha Gilding, MD       Or   acetaminophen (TYLENOL) suppository 650 mg  650 mg Rectal Q6H PRN Leatha Gilding, MD       ALPRAZolam Prudy Feeler) tablet 0.125 mg  0.125 mg Oral BID PRN Leatha Gilding, MD   0.125 mg at 10/27/22 0755   cholestyramine light (PREVALITE) packet 4 g  4 g Oral BID Leatha Gilding, MD   4 g at 10/27/22 1426   enoxaparin (LOVENOX) injection 40 mg  40 mg Subcutaneous QHS Leatha Gilding, MD   40 mg at 10/26/22 2100   insulin aspart (novoLOG) injection 0-5 Units  0-5 Units Subcutaneous QHS Leatha Gilding, MD   2 Units at 10/24/22 2227   insulin aspart (novoLOG) injection 0-9 Units  0-9 Units Subcutaneous TID WC Leatha Gilding, MD   1 Units at 10/27/22 1307   insulin glargine-yfgn (SEMGLEE) injection 8 Units  8 Units  Subcutaneous Daily Leatha Gilding, MD   8 Units at 10/27/22 1610   levothyroxine (SYNTHROID) tablet 75 mcg  75 mcg Oral QAC breakfast Leatha Gilding, MD   75 mcg at 10/27/22 0504   lip balm (CARMEX) ointment   Topical PRN Briant Cedar, MD   Given  at 10/27/22 1424   methocarbamol (ROBAXIN) tablet 500 mg  500 mg Oral Q6H PRN Briant Cedar, MD   500 mg at 10/27/22 1424   ondansetron (ZOFRAN) tablet 4 mg  4 mg Oral Q6H PRN Leatha Gilding, MD       Or   ondansetron (ZOFRAN) injection 4 mg  4 mg Intravenous Q6H PRN Leatha Gilding, MD       Oral care mouth rinse  15 mL Mouth Rinse PRN Leatha Gilding, MD       pantoprazole (PROTONIX) EC tablet 40 mg  40 mg Oral Daily Leatha Gilding, MD   40 mg at 10/27/22 9604   phenytoin (DILANTIN) ER capsule 300 mg  300 mg Oral Daily Leatha Gilding, MD   300 mg at 10/27/22 5409   polyvinyl alcohol (LIQUIFILM TEARS) 1.4 % ophthalmic solution 1 drop  1 drop Both Eyes PRN Briant Cedar, MD   1 drop at 10/27/22 1424   primidone (MYSOLINE) tablet 150 mg  150 mg Oral QHS Leatha Gilding, MD   150 mg at 10/26/22 2100   primidone (MYSOLINE) tablet 250 mg  250 mg Oral Daily Leatha Gilding, MD   250 mg at 10/27/22 0917   traMADol (ULTRAM) tablet 50 mg  50 mg Oral Q6H PRN Briant Cedar, MD   50 mg at 10/27/22 1424   verapamil (CALAN-SR) CR tablet 180 mg  180 mg Oral Daily Leatha Gilding, MD   180 mg at 10/27/22 8119     Discharge Medications: Please see discharge summary for a list of discharge medications.  Relevant Imaging Results:  Relevant Lab Results:   Additional Information [id number]  Howell Rucks, RN

## 2022-10-27 NOTE — Progress Notes (Signed)
Physical Therapy Treatment Patient Details Name: [name] MRN: [id number] DOB: 05/02/1930 Today's Date: 10/27/2022   History of Present Illness Patient is a 89 year old female who presented to the hospital on 6/22 with weakness and significant diarrhea for past few days. Of note patient had recent carpal tunnel surgery on L wrist on 6/18.patient was admitted with hyponatremia, and hypothyroidism.  PMH: breast cancer, carpal tunnel syndrome bilaterally, anemia, asthma, osteoporosis, R THA,    PT Comments    Pt reports fatigue from being unable to sleep last night. She agreed to bed level LE exercises. Reports continued pain in L hand/wrist post surgery. Recommend continued rehab after hospital stay.    Recommendations for follow up therapy are one component of a multi-disciplinary discharge planning process, led by the attending physician.  Recommendations Gelb be updated based on patient status, additional functional criteria and insurance authorization.  Follow Up Recommendations  Can patient physically be transported by private vehicle: No    Assistance Recommended at Discharge Frequent or constant Supervision/Assistance  Patient can return home with the following A lot of help with walking and/or transfers;A lot of help with bathing/dressing/bathroom;Assist for transportation;Help with stairs or ramp for entrance   Equipment Recommendations  None recommended by PT    Recommendations for Other Services       Precautions / Restrictions Precautions Precautions: Fall Precaution Comments: recent carpal tunnel surgery 6/18 with soft cast in place, assume NWB? Per ptt report: "told not to use for 2 weeks" Restrictions Weight Bearing Restrictions: Yes Other Position/Activity Restrictions: Per pt report: "told not to use for 2 weeks" (L hand/wrist)     Mobility  Bed Mobility               General bed mobility comments: Nt- pt politely declined OOB 2* fatigue/not getting any  sleep last night. She agreed to bed level LE exercises on today.    Transfers                        Ambulation/Gait                   Stairs             Wheelchair Mobility    Modified Rankin (Stroke Patients Only)       Balance                                            Cognition Arousal/Alertness: Awake/alert Behavior During Therapy: WFL for tasks assessed/performed Overall Cognitive Status: Within Functional Limits for tasks assessed                                          Exercises General Exercises - Lower Extremity Ankle Circles/Pumps: AROM, Both, 10 reps Heel Slides: AROM, Both, 10 reps Hip ABduction/ADduction: AROM, Both, 10 reps    General Comments        Pertinent Vitals/Pain Pain Assessment Pain Assessment: Faces Faces Pain Scale: Hurts even more Pain Location: L hand at all times Pain Descriptors / Indicators: Discomfort, Constant Pain Intervention(s): Monitored during session, Limited activity within patient's tolerance    Home Living  Prior Function            PT Goals (current goals can now be found in the care plan section) Progress towards PT goals: Progressing toward goals    Frequency    Min 1X/week      PT Plan Current plan remains appropriate    Co-evaluation              AM-PAC PT "6 Clicks" Mobility   Outcome Measure  Help needed turning from your back to your side while in a flat bed without using bedrails?: A Lot Help needed moving from lying on your back to sitting on the side of a flat bed without using bedrails?: Total Help needed moving to and from a bed to a chair (including a wheelchair)?: Total Help needed standing up from a chair using your arms (e.g., wheelchair or bedside chair)?: Total Help needed to walk in hospital room?: Total Help needed climbing 3-5 steps with a railing? : Total 6 Click Score: 7     End of Session   Activity Tolerance: Patient tolerated treatment well Patient left: in bed;with call bell/phone within reach;with bed alarm set         Time: 4098-1191 PT Time Calculation (min) (ACUTE ONLY): 8 min  Charges:  $Therapeutic Exercise: 8-22 mins                         Faye Ramsay, PT Acute Rehabilitation  Office: [phone]

## 2022-10-28 DIAGNOSIS — R531 Weakness: Secondary | ICD-10-CM

## 2022-10-28 DIAGNOSIS — E871 Hypo-osmolality and hyponatremia: Secondary | ICD-10-CM | POA: Diagnosis not present

## 2022-10-28 DIAGNOSIS — M79642 Pain in left hand: Secondary | ICD-10-CM

## 2022-10-28 LAB — BASIC METABOLIC PANEL
Anion gap: 5 (ref 5–15)
BUN: <5 mg/dL — ABNORMAL LOW (ref 8–23)
CO2: 19 mmol/L — ABNORMAL LOW (ref 22–32)
Calcium: 7.4 mg/dL — ABNORMAL LOW (ref 8.9–10.3)
Chloride: 102 mmol/L (ref 98–111)
Creatinine, Ser: 0.52 mg/dL (ref 0.44–1.00)
GFR, Estimated: >60 mL/min (ref >60)
Glucose, Bld: 83 mg/dL (ref 70–99)
Potassium: 3.6 mmol/L (ref 3.5–5.1)
Sodium: 126 mmol/L — ABNORMAL LOW (ref 135–145)

## 2022-10-28 LAB — GLUCOSE, CAPILLARY
Glucose-Capillary: 91 mg/dL (ref 70–99)
Glucose-Capillary: 215 mg/dL — ABNORMAL HIGH (ref 70–99)
Glucose-Capillary: 168 mg/dL — ABNORMAL HIGH (ref 70–99)
Glucose-Capillary: 172 mg/dL — ABNORMAL HIGH (ref 70–99)

## 2022-10-28 LAB — CBC
HCT: 31.3 % — ABNORMAL LOW (ref 36.0–46.0)
Hemoglobin: 10.6 g/dL — ABNORMAL LOW (ref 12.0–15.0)
MCH: 30.7 pg (ref 26.0–34.0)
MCHC: 33.9 g/dL (ref 30.0–36.0)
MCV: 90.7 fL (ref 80.0–100.0)
Platelets: 339 10*3/uL (ref 150–400)
RBC: 3.45 MIL/uL — ABNORMAL LOW (ref 3.87–5.11)
RDW: 13.8 % (ref 11.5–15.5)
WBC: 7.2 10*3/uL (ref 4.0–10.5)
nRBC: 0 % (ref 0.0–0.2)

## 2022-10-28 LAB — OSMOLALITY: Osmolality: 263 mOsm/kg — ABNORMAL LOW (ref 275–295)

## 2022-10-28 MED ORDER — SODIUM BICARBONATE 650 MG PO TABS
650.0000 mg | ORAL_TABLET | Freq: Two times a day (BID) | ORAL | Status: DC
  Administered 2022-10-28 – 2022-10-30 (×5): 650 mg via ORAL
  Filled 2022-10-28 (×6): qty 1

## 2022-10-28 MED ORDER — OXYCODONE-ACETAMINOPHEN 5-325 MG PO TABS
1.0000 | ORAL_TABLET | ORAL | Status: DC | PRN
  Administered 2022-10-28 – 2022-10-31 (×7): 1 via ORAL
  Filled 2022-10-28 (×7): qty 1

## 2022-10-28 MED ORDER — OXYCODONE HCL 5 MG PO TABS
5.0000 mg | ORAL_TABLET | Freq: Four times a day (QID) | ORAL | Status: DC | PRN
  Administered 2022-10-28 – 2022-11-01 (×7): 5 mg via ORAL
  Filled 2022-10-28 (×7): qty 1

## 2022-10-28 NOTE — Progress Notes (Signed)
Occupational Therapy Treatment Patient Details Name: [name] MRN: [id number] DOB: 1930-04-03 Today's Date: 10/28/2022   History of present illness Patient is a 89 year old female who presented to the hospital on 6/22 with weakness and significant diarrhea for past few days. Of note patient had recent carpal tunnel surgery on L wrist on 6/18.patient was admitted with hyponatremia, and hypothyroidism.  PMH: breast cancer, carpal tunnel syndrome bilaterally, anemia, asthma, osteoporosis, R THA,   OT comments  Patient was able to progress to transitioning into bathroom with platform walker with max A for sit to stands with patient hesitant for leaning forwards. Patient was noted to have increased redness, edema, and heat on first two digits on L hand. Nurse and MD made aware. Session limited with arrival of friends in room. Patient's discharge plan remains appropriate at this time. OT will continue to follow acutely.     Recommendations for follow up therapy are one component of a multi-disciplinary discharge planning process, led by the attending physician.  Recommendations Hoeffner be updated based on patient status, additional functional criteria and insurance authorization.    Assistance Recommended at Discharge Frequent or constant Supervision/Assistance  Patient can return home with the following  Two people to help with walking and/or transfers;Two people to help with bathing/dressing/bathroom;Assistance with cooking/housework;Direct supervision/assist for medications management;Assist for transportation;Help with stairs or ramp for entrance;Direct supervision/assist for financial management   Equipment Recommendations  None recommended by OT       Precautions / Restrictions Precautions Precautions: Fall Precaution Comments: recent carpal tunnel surgery 6/18 with soft cast in place, assume NWB? Per ptt report: "told not to use for 2 weeks" Restrictions Weight Bearing Restrictions:  Yes Other Position/Activity Restrictions: Per pt report: "told not to use for 2 weeks" (L hand/wrist)       Mobility Bed Mobility               General bed mobility comments: up in recliner with NT just prior to entrance       Balance Overall balance assessment: Needs assistance Sitting-balance support: Feet supported, Single extremity supported Sitting balance-Leahy Scale: Fair     Standing balance support: Reliant on assistive device for balance, Bilateral upper extremity supported, During functional activity Standing balance-Leahy Scale: Poor         ADL either performed or assessed with clinical judgement   ADL Overall ADL's : Needs assistance/impaired       Toilet Transfer: Moderate assistance;Ambulation;Rolling walker (2 wheels) Toilet Transfer Details (indicate cue type and reason): patient was max A for sit to stand and then min guard with platform walker. Toileting- Clothing Manipulation and Hygiene: Maximal assistance;Sit to/from stand                Cognition Arousal/Alertness: Awake/alert Behavior During Therapy: WFL for tasks assessed/performed Overall Cognitive Status: Within Functional Limits for tasks assessed             General Comments: patient was noted to need ecouragement to participate in task prior to asking therapist to complete it for her. patient's family was not present during session                   Pertinent Vitals/ Pain       Pain Assessment Pain Assessment: Faces Faces Pain Scale: Hurts even more Pain Location: L hand and first two digits on R hand Pain Descriptors / Indicators: Discomfort, Constant Pain Intervention(s): Limited activity within patient's tolerance, Monitored during session  Frequency  Min 1X/week        Progress Toward Goals  OT Goals(current goals can now be found in the care plan section)  Progress towards OT goals: Progressing toward goals     Plan Discharge plan  remains appropriate       AM-PAC OT "6 Clicks" Daily Activity     Outcome Measure   Help from another person eating meals?: A Little Help from another person taking care of personal grooming?: A Lot Help from another person toileting, which includes using toliet, bedpan, or urinal?: A Lot Help from another person bathing (including washing, rinsing, drying)?: A Lot Help from another person to put on and taking off regular upper body clothing?: A Lot Help from another person to put on and taking off regular lower body clothing?: A Lot 6 Click Score: 13    End of Session Equipment Utilized During Treatment: Gait belt;Other (comment) (L platform walker)  OT Visit Diagnosis: Pain;Muscle weakness (generalized) (M62.81);Unsteadiness on feet (R26.81);Other abnormalities of gait and mobility (R26.89) Pain - Right/Left: Left Pain - part of body: Hand;Arm   Activity Tolerance Patient limited by fatigue;Patient limited by pain   Patient Left with call bell/phone within reach;in chair;with chair alarm set   Nurse Communication Patient requests pain meds        Time: 1030-1059 OT Time Calculation (min): 29 min  Charges: OT General Charges $OT Visit: 1 Visit OT Treatments $Self Care/Home Management : 23-37 mins  Rosalio Loud, MS Acute Rehabilitation Department Office# [phone]   Selinda Flavin 10/28/2022, 12:32 PM

## 2022-10-28 NOTE — Progress Notes (Signed)
PROGRESS NOTE  Angles Trevizo Chow VWU:[id number] DOB: 10-Apr-1930 DOA: 10/24/2022 PCP: Verlon Au, MD  Hospital course [name] is a 89 y.o. female with medical history significant for DM, chronic hyponatremia, hypothyroidism, depression, who comes into the hospital with generalized weakness as well as significant diarrhea.  Symptoms have been present for the past couple of days, with associated poor oral intake. Of note, she recently had left hand carpal tunnel surgery on 10/20/2022 by Dr. Bradly Bienenstock from [facility], recommended postop follow up on 10/27/22. In the ED, vital signs stable, labs stable except for sodium of 116.  Patient admitted for further management given hyponatremia.  Subjective:  Feeling improved. Continues to describe significant pain and swelling in post-op hand. She is upset that I will be stopping tramadol but agreeable to trying something else for pain control.   Assessment/Plan: Principal Problem:   Hyponatremia  Acute on chronic hyponatremia- Na+ remaining stable. 126 today.  Reviewed medications for contribution with pharmacy. Discontinuing tramadol for possible contribution to worsening hyponatremia and she has been taking this for years.  TSH WNL Urine osmolality 257, urine sodium 20 Dr. Arlean Hopping from nephrology on 6/25, recommend continuing IV fluids and repeating urine lites (would want sodium to be 130 and above prior to discharge) - Continue gentle IV hydration - Adding salt tablets.  - stop tramadol  Chronic diarrhea- Improving GIP panel negative Continue PTA cholestyramine   Insulin-dependent diabetes mellitus, with hyperglycemia Last A1c 6.9- well below goal for age. Will stop insulin    Hypothyroidism TSH WNL  Resume Synthroid   Intermittent left lower extremity swelling  Bilateral vascular Doppler negative for DVT   Essential hypertension Continue home verapamil   S/p left hand carpal tunnel surgery  closed fx of proximal phalanx of  middle left finger- tearful from pain in post-op hand. Significant swelling. Poorly controlled pain. Continue to wear splint/brace and elevate.  Surgery on 10/20/2022 by Dr. Bradly Bienenstock from [facility], recommended postop follow up on 10/27/22 but she was hospitalized.  Prior provider attempted to contact [facility] on 6/24 and 6/25 to let him know patient is here at [facility] long- unsure if there was any follow up.  Called [facility] on call today, 6/26, and spoke with Dr. Victorino Dike who took patient's information and will pass along my request to have patient evaluated inpatient to Dr. Melvyn Novas.  Pain management OT consulted  Generalized weakness Likely multifactorial PT/OT-recommend SNF  Estimated body mass index is 22.24 kg/m as calculated from the following:   Height as of this encounter: 5\' 7"  (1.702 m).   Weight as of this encounter: 64.4 kg.     Code Status: Full  Family Communication: none at bedside  Disposition Plan: Status is: Inpatient Remains inpatient appropriate because: Level of care  Consultants: None  Procedures: None  Antimicrobials: None  DVT prophylaxis: Lovenox  Objective: Vitals:   10/27/22 0530 10/27/22 1100 10/27/22 2037 10/28/22 0441  BP: 122/64 (!) 141/65 (!) 146/68 131/65  Pulse: 65  64 65  Resp: 14 16 16 15   Temp: 98.7 F (37.1 C) 98.1 F (36.7 C) 98.4 F (36.9 C) 97.6 F (36.4 C)  TempSrc: Oral Oral Oral   SpO2: 96% 96% 96% 94%  Weight:      Height:        Intake/Output Summary (Last 24 hours) at 10/28/2022 0747 Last data filed at 10/28/2022 0600 Gross per 24 hour  Intake 1928.05 ml  Output 2600 ml  Net -671.95 ml  Filed Weights   10/24/22 1600  Weight: 64.4 kg    Exam: General: alert, in mild distress  Cardiovascular: S1, S2 present Respiratory: CTAB Musculoskeletal: No bilateral pedal edema noted, left hand swelling noted with pain, dressing clean/dry/intact Skin: Normal Psychiatry: Normal mood   Data  Reviewed: CBC: Recent Labs  Lab 10/24/22 1233 10/26/22 0426 10/28/22 0500  WBC 9.7 6.5 7.2  NEUTROABS 7.4 4.6  --   HGB 11.1* 10.2* 10.6*  HCT 33.5* 30.0* 31.3*  MCV 90.5 90.4 90.7  PLT 304 306 339    Basic Metabolic Panel: Recent Labs  Lab 10/24/22 1233 10/24/22 1648 10/24/22 1649 10/25/22 1226 10/25/22 2041 10/26/22 0426 10/27/22 0454 10/28/22 0500  NA 116* 120*   < > 125* 126* 127* 125* 126*  K 4.3 4.4  --   --   --  3.6 3.9 3.6  CL 88* 92*  --   --   --  99 101 102  CO2 19* 16*  --   --   --  22 17* 19*  GLUCOSE 201* 203*  --   --   --  135* 120* 83  BUN 13 13  --   --   --  8 5* <5*  CREATININE 0.71 0.68  --   --   --  0.57 0.49 0.52  CALCIUM 8.0* 7.8*  --   --   --  7.4* 7.0* 7.4*   < > = values in this interval not displayed.    GFR: Estimated Creatinine Clearance: 45.5 mL/min (by C-G formula based on SCr of 0.52 mg/dL). Liver Function Tests: Recent Labs  Lab 10/24/22 1233  AST 15  ALT 14  ALKPHOS 87  BILITOT 0.5  PROT 6.4*  ALBUMIN 3.2*    CBG: Recent Labs  Lab 10/27/22 0734 10/27/22 1226 10/27/22 1701 10/27/22 2034 10/28/22 0734  GLUCAP 120* 147* 128* 98 91    Urine analysis:    Component Value Date/Time   COLORURINE YELLOW 10/24/2022 2131   APPEARANCEUR CLEAR 10/24/2022 2131   LABSPEC 1.008 10/24/2022 2131   PHURINE 5.0 10/24/2022 2131   GLUCOSEU NEGATIVE 10/24/2022 2131   GLUCOSEU NEGATIVE 12/28/2013 1036   HGBUR NEGATIVE 10/24/2022 2131   HGBUR trace-intact 06/29/2007 1304   BILIRUBINUR NEGATIVE 10/24/2022 2131   BILIRUBINUR negative 01/19/2017 1049   KETONESUR NEGATIVE 10/24/2022 2131   PROTEINUR NEGATIVE 10/24/2022 2131   UROBILINOGEN 0.2 01/19/2017 1049   UROBILINOGEN 1.0 03/25/2015 1755   NITRITE NEGATIVE 10/24/2022 2131   LEUKOCYTESUR SMALL (A) 10/24/2022 2131   Sepsis Labs: @LABRCNTIP (procalcitonin:4,lacticidven:4)  ) Recent Results (from the past 240 hour(s))  MRSA Next Gen by PCR, Nasal     Status: None    Collection Time: 10/24/22  3:42 PM   Specimen: Nasal Mucosa; Nasal Swab  Result Value Ref Range Status   MRSA by PCR Next Gen NOT DETECTED NOT DETECTED Final    Comment: (NOTE) The GeneXpert MRSA Assay (FDA approved for NASAL specimens only), is one component of a comprehensive MRSA colonization surveillance program. It is not intended to diagnose MRSA infection nor to guide or monitor treatment for MRSA infections. Test performance is not FDA approved in patients less than 51 years old. Performed at [facility], [address], [place], [place] [address]   Gastrointestinal Panel by PCR , Stool     Status: None   Collection Time: 10/25/22  9:21 AM   Specimen: Stool  Result Value Ref Range Status   Campylobacter species NOT DETECTED  NOT DETECTED Final   Plesimonas shigelloides NOT DETECTED NOT DETECTED Final   Salmonella species NOT DETECTED NOT DETECTED Final   Yersinia enterocolitica NOT DETECTED NOT DETECTED Final   Vibrio species NOT DETECTED NOT DETECTED Final   Vibrio cholerae NOT DETECTED NOT DETECTED Final   Enteroaggregative E coli (EAEC) NOT DETECTED NOT DETECTED Final   Enteropathogenic E coli (EPEC) NOT DETECTED NOT DETECTED Final   Enterotoxigenic E coli (ETEC) NOT DETECTED NOT DETECTED Final   Shiga like toxin producing E coli (STEC) NOT DETECTED NOT DETECTED Final   Shigella/Enteroinvasive E coli (EIEC) NOT DETECTED NOT DETECTED Final   Cryptosporidium NOT DETECTED NOT DETECTED Final   Cyclospora cayetanensis NOT DETECTED NOT DETECTED Final   Entamoeba histolytica NOT DETECTED NOT DETECTED Final   Giardia lamblia NOT DETECTED NOT DETECTED Final   Adenovirus F40/41 NOT DETECTED NOT DETECTED Final   Astrovirus NOT DETECTED NOT DETECTED Final   Norovirus GI/GII NOT DETECTED NOT DETECTED Final   Rotavirus A NOT DETECTED NOT DETECTED Final   Sapovirus (I, II, IV, and V) NOT DETECTED NOT DETECTED Final    Comment: Performed at [facility],  [address] [place], [place] [address]    Studies: No results found.  Scheduled Meds:  cholestyramine light  4 g Oral BID   enoxaparin (LOVENOX) injection  40 mg Subcutaneous QHS   insulin aspart  0-5 Units Subcutaneous QHS   insulin aspart  0-9 Units Subcutaneous TID WC   insulin glargine-yfgn  8 Units Subcutaneous Daily   levothyroxine  75 mcg Oral QAC breakfast   pantoprazole  40 mg Oral Daily   phenytoin  300 mg Oral Daily   primidone  150 mg Oral QHS   primidone  250 mg Oral Daily   verapamil  180 mg Oral Daily    Continuous Infusions:  sodium chloride 75 mL/hr at 10/27/22 2107     LOS: 4 days     Leeroy Bock, MD [facility]  If 7PM-7AM, please contact night-coverage [email or web address] 10/28/2022, 7:47 AM

## 2022-10-28 NOTE — Progress Notes (Signed)
The patient was seen and examined this afternoon.  The hand was examined.  The patient does have the moderate swelling in the fingertips.  The bandages redressed today.  The patient is currently taking oral pain medication.  I would not remove the bandage while in the hospital.  If she was to go to a rehab facility we would need to see her back in the office in approximately 1 week to take the sutures out.  Keep the bandage on at all times do not remove.  Happy to see the patient back in follow-up.  Ice elevation activity modification she can use the hand as she tolerates.  Please contact me if any other questions arise.

## 2022-10-29 DIAGNOSIS — M79642 Pain in left hand: Secondary | ICD-10-CM | POA: Diagnosis not present

## 2022-10-29 DIAGNOSIS — R531 Weakness: Secondary | ICD-10-CM | POA: Diagnosis not present

## 2022-10-29 DIAGNOSIS — E871 Hypo-osmolality and hyponatremia: Secondary | ICD-10-CM | POA: Diagnosis not present

## 2022-10-29 LAB — BASIC METABOLIC PANEL
Anion gap: 6 (ref 5–15)
BUN: <5 mg/dL — ABNORMAL LOW (ref 8–23)
CO2: 20 mmol/L — ABNORMAL LOW (ref 22–32)
Calcium: 7.4 mg/dL — ABNORMAL LOW (ref 8.9–10.3)
Chloride: 102 mmol/L (ref 98–111)
Creatinine, Ser: 0.53 mg/dL (ref 0.44–1.00)
GFR, Estimated: >60 mL/min (ref >60)
Glucose, Bld: 136 mg/dL — ABNORMAL HIGH (ref 70–99)
Potassium: 3.4 mmol/L — ABNORMAL LOW (ref 3.5–5.1)
Sodium: 128 mmol/L — ABNORMAL LOW (ref 135–145)

## 2022-10-29 MED ORDER — POTASSIUM CHLORIDE CRYS ER 20 MEQ PO TBCR
40.0000 meq | EXTENDED_RELEASE_TABLET | Freq: Two times a day (BID) | ORAL | Status: AC
  Administered 2022-10-29 (×2): 40 meq via ORAL
  Filled 2022-10-29 (×2): qty 2

## 2022-10-29 NOTE — Plan of Care (Signed)
  Problem: Education: Goal: Knowledge of General Education information will improve Description: Including pain rating scale, medication(s)/side effects and non-pharmacologic comfort measures Outcome: Progressing   Problem: Clinical Measurements: Goal: Ability to maintain clinical measurements within normal limits will improve Outcome: Progressing   Problem: Activity: Goal: Risk for activity intolerance will decrease Outcome: Progressing   Problem: Health Behavior/Discharge Planning: Goal: Ability to manage health-related needs will improve Outcome: Not Progressing

## 2022-10-29 NOTE — Progress Notes (Addendum)
Physical Therapy Treatment Patient Details Name: [name] MRN: [id number] DOB: 08-11-1929 Today's Date: 10/29/2022   History of Present Illness Patient is a 89 year old female who presented to the hospital on 6/22 with weakness and significant diarrhea for past few days. Of note patient had recent carpal tunnel surgery on L wrist on 6/18.patient was admitted with hyponatremia, and hypothyroidism.  PMH: breast cancer, carpal tunnel syndrome bilaterally, anemia, asthma, osteoporosis, R THA,    PT Comments    Assisted patient to  stand a nd pivot to and from [facility]. Patient  making steady progress.   Recommendations for follow up therapy are one component of a multi-disciplinary discharge planning process, led by the attending physician.  Recommendations [name] be updated based on patient status, additional functional criteria and insurance authorization.  Follow Up Recommendations  Can patient physically be transported by private vehicle: Yes    Assistance Recommended at Discharge Frequent or constant Supervision/Assistance  Patient can return home with the following A lot of help with walking and/or transfers;A lot of help with bathing/dressing/bathroom;Assist for transportation;Help with stairs or ramp for entrance   Equipment Recommendations  None recommended by PT    Recommendations for Other Services       Precautions / Restrictions Precautions Precautions: Fall Precaution Comments: recent carpal tunnel surgery 6/18 with soft cast in place, assume NWB? Per ptt report: "told not to use for 2 weeks"     Mobility  Bed Mobility         General bed mobility comments: in recliner    Transfers Overall transfer level: Needs assistance Equipment used: Left platform walker Transfers: Sit to/from Stand, Bed to chair/wheelchair/BSC Sit to Stand: Mod assist, Max assist           General transfer comment: mod assist from raised recliner seat,  patient able to reach for [facility] and  pivot step to [facility]. max support to rise from [facility]. Bed pulled close so patient could hold to bed  rail to stand  to be cleaned up post B/B. patient then able to pivot to recliner  with min assistnace.    Ambulation/Gait       Stairs             Wheelchair Mobility    Modified Rankin (Stroke Patients Only)       Balance Overall balance assessment: Needs assistance Sitting-balance support: Feet supported, Single extremity supported Sitting balance-[name] Scale: Fair     Standing balance support: Reliant on assistive device for balance, Bilateral upper extremity supported, During functional activity Standing balance-[name] Scale: Poor                              Cognition Arousal/Alertness: Awake/alert Behavior During Therapy: WFL for tasks assessed/performed Overall Cognitive Status: Within Functional Limits for tasks assessed                                 General Comments: patient  motivated to get up and ambualte        Exercises      General Comments        Pertinent Vitals/Pain Pain Assessment Pain Assessment: Faces Pain Score: 4  Faces Pain Scale: Hurts little more Pain Location: L hand and first two digits on R hand Pain Descriptors / Indicators: Discomfort Pain Intervention(s): Repositioned, Ice applied    Home Living  Prior Function            PT Goals (current goals can now be found in the care plan section) Progress towards PT goals: Progressing toward goals    Frequency    Min 1X/week      PT Plan Current plan remains appropriate    Co-evaluation              AM-PAC PT "6 Clicks" Mobility   Outcome Measure  Help needed turning from your back to your side while in a flat bed without using bedrails?: A Lot Help needed moving from lying on your back to sitting on the side of a flat bed without using bedrails?: A Lot Help needed moving to and from a bed to a  chair (including a wheelchair)?: A Lot Help needed standing up from a chair using your arms (e.g., wheelchair or bedside chair)?: A Lot Help needed to walk in hospital room?: A Lot Help needed climbing 3-5 steps with a railing? : Total 6 Click Score: 11    End of Session Equipment Utilized During Treatment: Gait belt Activity Tolerance: Patient tolerated treatment well Patient left: in chair;with call bell/phone within reach;with chair alarm set Nurse Communication: Mobility status PT Visit Diagnosis: Unsteadiness on feet (R26.81);Pain Pain - Right/Left: Left Pain - part of body: Hand     Time: 0102-7253 PT Time Calculation (min) (ACUTE ONLY): 13 min  Charges:   $Therapeutic Activity: 8-22 mins                     Blanchard Kelch PT Acute Rehabilitation Services Office [phone] Weekend [phone]    [name] 10/29/2022, 3:49 PM

## 2022-10-29 NOTE — Progress Notes (Signed)
Physical Therapy Treatment Patient Details Name: [name] Mia MRN: [id number] DOB: 06-15-1929 Today's Date: 10/29/2022   History of Present Illness Patient is a 89 year old female who presented to the hospital on 6/22 with weakness and significant diarrhea for past few days. Of note patient had recent carpal tunnel surgery on L wrist on 6/18.patient was admitted with hyponatremia, and hypothyroidism.  PMH: breast cancer, carpal tunnel syndrome bilaterally, anemia, asthma, osteoporosis, R THA,    PT Comments    The patient eager to mobilize today. Mod assist with be dmobility, then ambulated x 20' using L plaform RW. Patient  tolerated very well and pleased with  her progress. Continue PT.   Recommendations for follow up therapy are one component of a multi-disciplinary discharge planning process, led by the attending physician.  Recommendations [place] be updated based on patient status, additional functional criteria and insurance authorization.  Follow Up Recommendations  Can patient physically be transported by private vehicle: Yes    Assistance Recommended at Discharge Frequent or constant Supervision/Assistance  Patient can return home with the following A lot of help with walking and/or transfers;A lot of help with bathing/dressing/bathroom;Assist for transportation;Help with stairs or ramp for entrance   Equipment Recommendations  None recommended by PT    Recommendations for Other Services       Precautions / Restrictions Precautions Precautions: Fall Precaution Comments: recent carpal tunnel surgery 6/18 with soft cast in place, assume NWB? Per ptt report: "told not to use for 2 weeks"     Mobility  Bed Mobility Overal bed mobility: Needs Assistance Bed Mobility: Supine to Sit Rolling: Mod assist         General bed mobility comments: pt. able to initiate moving legs to bed edge and able to sit upright, required support to scoot to bed edge    Transfers Overall  transfer level: Needs assistance Equipment used: Left platform walker Transfers: Sit to/from Stand Sit to Stand: Mod assist           General transfer comment: mod assist and bed elevated to stand  at [facility].    Ambulation/Gait Ambulation/Gait assistance: Min assist Gait Distance (Feet): 20 Feet Assistive device: Left platform walker Gait Pattern/deviations: Step-to pattern Gait velocity: decr     General Gait Details: patient balanced once standing  at platform RW, Able to push Rw and turn around, back up with  steady support   Stairs             Wheelchair Mobility    Modified Rankin (Stroke Patients Only)       Balance Overall balance assessment: Needs assistance Sitting-balance support: Feet supported, Single extremity supported Sitting balance-Leahy Scale: Fair     Standing balance support: Reliant on assistive device for balance, Bilateral upper extremity supported, During functional activity Standing balance-Leahy Scale: Poor                              Cognition Arousal/Alertness: Awake/alert Behavior During Therapy: WFL for tasks assessed/performed Overall Cognitive Status: Within Functional Limits for tasks assessed                                 General Comments: patient  motivated to get up and ambualte        Exercises      General Comments        Pertinent Vitals/Pain Pain  Assessment Pain Assessment: Faces Faces Pain Scale: Hurts little more Pain Location: L hand and first two digits on R hand Pain Descriptors / Indicators: Discomfort Pain Intervention(s): Ice applied, Repositioned    Home Living                          Prior Function            PT Goals (current goals can now be found in the care plan section) Progress towards PT goals: Progressing toward goals    Frequency    Min 1X/week      PT Plan Current plan remains appropriate    Co-evaluation               AM-PAC PT "6 Clicks" Mobility   Outcome Measure  Help needed turning from your back to your side while in a flat bed without using bedrails?: A Lot Help needed moving from lying on your back to sitting on the side of a flat bed without using bedrails?: A Lot Help needed moving to and from a bed to a chair (including a wheelchair)?: A Lot Help needed standing up from a chair using your arms (e.g., wheelchair or bedside chair)?: A Lot Help needed to walk in hospital room?: A Lot Help needed climbing 3-5 steps with a railing? : Total 6 Click Score: 11    End of Session Equipment Utilized During Treatment: Gait belt Activity Tolerance: Patient tolerated treatment well Patient left: in chair;with call bell/phone within reach;with chair alarm set Nurse Communication: Mobility status PT Visit Diagnosis: Unsteadiness on feet (R26.81);Pain Pain - Right/Left: Left Pain - part of body: Hand     Time: 1435-1510 PT Time Calculation (min) (ACUTE ONLY): 35 min  Charges:  $Gait Training: 8-22 mins $Therapeutic Exercise: 8-22 mins                     Blanchard Kelch PT Acute Rehabilitation Services Office [phone] Weekend [phone]   Rada Hay 10/29/2022, 3:43 PM

## 2022-10-29 NOTE — Progress Notes (Signed)
PROGRESS NOTE  [name] YQM:[id number] DOB: 1929-08-02 DOA: 10/24/2022 PCP: Verlon Au, MD  Hospital course [name] is a 89 y.o. female with medical history significant for DM, chronic hyponatremia, hypothyroidism, depression, who comes into the hospital with generalized weakness as well as significant diarrhea.  Symptoms have been present for the past couple of days, with associated poor oral intake. Of note, she recently had left hand carpal tunnel surgery on 10/20/2022 by Dr. Bradly Bienenstock from [facility], recommended postop follow up on 10/27/22. In the ED, vital signs stable, labs stable except for sodium of 116.  Patient admitted for further management given hyponatremia.  Subjective:  Feeling improved. Continues to describe significant pain and swelling in post-op hand. She states that she is having difficulty eating with only one hand.   Assessment/Plan: Principal Problem:   Hyponatremia Active Problems:   Generalized weakness   Left hand pain  Acute on chronic hyponatremia- Na+ remaining stable. 528>413 today.  Reviewed medications for contribution with pharmacy. Discontinuing tramadol for possible contribution to worsening hyponatremia and she has been taking this for years.  TSH WNL Urine osmolality 257, urine sodium 20 Dr. Arlean Hopping from nephrology on 6/25, recommend continuing IV fluids and repeating urine lites (would want sodium to be 130 and above prior to discharge) - Continue gentle IV hydration - continue salt tablets.  - stop tramadol  Chronic diarrhea- Improving GIP panel negative Continue PTA cholestyramine   Insulin-dependent diabetes mellitus, with hyperglycemia Last A1c 6.9- well below goal for age. Will stop insulin    Hypothyroidism TSH WNL  Resume Synthroid   Intermittent left lower extremity swelling  Bilateral vascular Doppler negative for DVT   Essential hypertension Continue home verapamil   S/p left hand carpal tunnel surgery   closed fx of proximal phalanx of middle left finger- tearful from pain in post-op hand. Significant swelling. Poorly controlled pain. Continue to wear splint/brace and elevate.  Surgery on 10/20/2022 by Dr. Bradly Bienenstock from [facility], recommended postop follow up on 10/27/22 but she was hospitalized.  Prior provider attempted to contact [facility] on 6/24 and 6/25 to let him know patient is here at [facility] long- unsure if there was any follow up.  Per Dr. Melvyn Novas, who evaluated on 6/26- patient to keep wound wrapped and elevated and will follow up outpatient in about a week.   Pain management OT consulted  Generalized weakness Likely multifactorial PT/OT-recommend SNF  Estimated body mass index is 22.24 kg/m as calculated from the following:   Height as of this encounter: 5\' 7"  (1.702 m).   Weight as of this encounter: 64.4 kg.    Code Status: Full  Family Communication: none at bedside  Disposition Plan: Status is: Inpatient Remains inpatient appropriate because: Level of care  Consultants: Ortho  Nephrology   Procedures: None  Antimicrobials: None  DVT prophylaxis: Lovenox  Objective: Vitals:   10/28/22 0441 10/28/22 1240 10/28/22 1953 10/29/22 0520  BP: 131/65 (!) 155/76 (!) 149/67 (!) 125/59  Pulse: 65 74 74 (!) 57  Resp: 15 18 18 16   Temp: 97.6 F (36.4 C) 97.7 F (36.5 C) 98.7 F (37.1 C) 98.5 F (36.9 C)  TempSrc:  Oral Oral Oral  SpO2: 94% 99% 98% 94%  Weight:      Height:        Intake/Output Summary (Last 24 hours) at 10/29/2022 0818 Last data filed at 10/29/2022 2440 Gross per 24 hour  Intake 2827.36 ml  Output 2250 ml  Net 577.36  ml    Filed Weights   10/24/22 1600  Weight: 64.4 kg   Exam: General: alert, in mild distress  Cardiovascular: S1, S2 present Respiratory: CTAB Musculoskeletal: No bilateral pedal edema noted, left hand swelling noted which is improved. No pain to palpation, dressing clean/dry/intact Skin: Normal Psychiatry:  Normal mood   Data Reviewed: CBC: Recent Labs  Lab 10/24/22 1233 10/26/22 0426 10/28/22 0500  WBC 9.7 6.5 7.2  NEUTROABS 7.4 4.6  --   HGB 11.1* 10.2* 10.6*  HCT 33.5* 30.0* 31.3*  MCV 90.5 90.4 90.7  PLT 304 306 339    Basic Metabolic Panel: Recent Labs  Lab 10/24/22 1648 10/24/22 1649 10/25/22 2041 10/26/22 0426 10/27/22 0454 10/28/22 0500 10/29/22 0518  NA 120*   < > 126* 127* 125* 126* 128*  K 4.4  --   --  3.6 3.9 3.6 3.4*  CL 92*  --   --  99 101 102 102  CO2 16*  --   --  22 17* 19* 20*  GLUCOSE 203*  --   --  135* 120* 83 136*  BUN 13  --   --  8 5* <5* <5*  CREATININE 0.68  --   --  0.57 0.49 0.52 0.53  CALCIUM 7.8*  --   --  7.4* 7.0* 7.4* 7.4*   < > = values in this interval not displayed.    GFR: Estimated Creatinine Clearance: 45.5 mL/min (by C-G formula based on SCr of 0.53 mg/dL). Liver Function Tests: Recent Labs  Lab 10/24/22 1233  AST 15  ALT 14  ALKPHOS 87  BILITOT 0.5  PROT 6.4*  ALBUMIN 3.2*    CBG: Recent Labs  Lab 10/27/22 2034 10/28/22 0734 10/28/22 1123 10/28/22 1632 10/28/22 2101  GLUCAP 98 91 215* 168* 172*    Urine analysis:    Component Value Date/Time   COLORURINE YELLOW 10/24/2022 2131   APPEARANCEUR CLEAR 10/24/2022 2131   LABSPEC 1.008 10/24/2022 2131   PHURINE 5.0 10/24/2022 2131   GLUCOSEU NEGATIVE 10/24/2022 2131   GLUCOSEU NEGATIVE 12/28/2013 1036   HGBUR NEGATIVE 10/24/2022 2131   HGBUR trace-intact 06/29/2007 1304   BILIRUBINUR NEGATIVE 10/24/2022 2131   BILIRUBINUR negative 01/19/2017 1049   KETONESUR NEGATIVE 10/24/2022 2131   PROTEINUR NEGATIVE 10/24/2022 2131   UROBILINOGEN 0.2 01/19/2017 1049   UROBILINOGEN 1.0 03/25/2015 1755   NITRITE NEGATIVE 10/24/2022 2131   LEUKOCYTESUR SMALL (A) 10/24/2022 2131   Sepsis Labs: @LABRCNTIP (procalcitonin:4,lacticidven:4)  ) Recent Results (from the past 240 hour(s))  MRSA Next Gen by PCR, Nasal     Status: None   Collection Time: 10/24/22  3:42 PM    Specimen: Nasal Mucosa; Nasal Swab  Result Value Ref Range Status   MRSA by PCR Next Gen NOT DETECTED NOT DETECTED Final    Comment: (NOTE) The GeneXpert MRSA Assay (FDA approved for NASAL specimens only), is one component of a comprehensive MRSA colonization surveillance program. It is not intended to diagnose MRSA infection nor to guide or monitor treatment for MRSA infections. Test performance is not FDA approved in patients less than 41 years old. Performed at [facility], [address], [place], [place] [address]   Gastrointestinal Panel by PCR , Stool     Status: None   Collection Time: 10/25/22  9:21 AM   Specimen: Stool  Result Value Ref Range Status   Campylobacter species NOT DETECTED NOT DETECTED Final   Plesimonas shigelloides NOT DETECTED NOT DETECTED Final   Salmonella species  NOT DETECTED NOT DETECTED Final   Yersinia enterocolitica NOT DETECTED NOT DETECTED Final   Vibrio species NOT DETECTED NOT DETECTED Final   Vibrio cholerae NOT DETECTED NOT DETECTED Final   Enteroaggregative E coli (EAEC) NOT DETECTED NOT DETECTED Final   Enteropathogenic E coli (EPEC) NOT DETECTED NOT DETECTED Final   Enterotoxigenic E coli (ETEC) NOT DETECTED NOT DETECTED Final   Shiga like toxin producing E coli (STEC) NOT DETECTED NOT DETECTED Final   Shigella/Enteroinvasive E coli (EIEC) NOT DETECTED NOT DETECTED Final   Cryptosporidium NOT DETECTED NOT DETECTED Final   Cyclospora cayetanensis NOT DETECTED NOT DETECTED Final   Entamoeba histolytica NOT DETECTED NOT DETECTED Final   Giardia lamblia NOT DETECTED NOT DETECTED Final   Adenovirus F40/41 NOT DETECTED NOT DETECTED Final   Astrovirus NOT DETECTED NOT DETECTED Final   Norovirus GI/GII NOT DETECTED NOT DETECTED Final   Rotavirus A NOT DETECTED NOT DETECTED Final   Sapovirus (I, II, IV, and V) NOT DETECTED NOT DETECTED Final    Comment: Performed at [facility], [address] [place], [place]  [address]    Studies: No results found.  Scheduled Meds:  cholestyramine light  4 g Oral BID   enoxaparin (LOVENOX) injection  40 mg Subcutaneous QHS   levothyroxine  75 mcg Oral QAC breakfast   pantoprazole  40 mg Oral Daily   phenytoin  300 mg Oral Daily   primidone  150 mg Oral QHS   primidone  250 mg Oral Daily   sodium bicarbonate  650 mg Oral BID   verapamil  180 mg Oral Daily    Continuous Infusions:  sodium chloride 75 mL/hr at 10/29/22 6045     LOS: 5 days     Leeroy Bock, MD [facility]  If 7PM-7AM, please contact night-coverage [email or web address] 10/29/2022, 8:18 AM

## 2022-10-30 DIAGNOSIS — E871 Hypo-osmolality and hyponatremia: Secondary | ICD-10-CM | POA: Diagnosis not present

## 2022-10-30 LAB — BASIC METABOLIC PANEL
Anion gap: 6 (ref 5–15)
BUN: <5 mg/dL — ABNORMAL LOW (ref 8–23)
CO2: 20 mmol/L — ABNORMAL LOW (ref 22–32)
Calcium: 7.7 mg/dL — ABNORMAL LOW (ref 8.9–10.3)
Chloride: 103 mmol/L (ref 98–111)
Creatinine, Ser: 0.52 mg/dL (ref 0.44–1.00)
GFR, Estimated: >60 mL/min (ref >60)
Glucose, Bld: 168 mg/dL — ABNORMAL HIGH (ref 70–99)
Potassium: 4.4 mmol/L (ref 3.5–5.1)
Sodium: 129 mmol/L — ABNORMAL LOW (ref 135–145)

## 2022-10-30 LAB — GLUCOSE, CAPILLARY
Glucose-Capillary: 268 mg/dL — ABNORMAL HIGH (ref 70–99)
Glucose-Capillary: 175 mg/dL — ABNORMAL HIGH (ref 70–99)

## 2022-10-30 MED ORDER — SODIUM CHLORIDE 1 G PO TABS
1.0000 g | ORAL_TABLET | Freq: Three times a day (TID) | ORAL | Status: DC
  Administered 2022-10-30 – 2022-11-01 (×7): 1 g via ORAL
  Filled 2022-10-30 (×6): qty 1

## 2022-10-30 MED ORDER — INSULIN ASPART 100 UNIT/ML IJ SOLN
0.0000 [IU] | Freq: Three times a day (TID) | INTRAMUSCULAR | Status: DC
  Administered 2022-10-30 – 2022-10-31 (×2): 3 [IU] via SUBCUTANEOUS
  Administered 2022-10-31 – 2022-11-01 (×2): 2 [IU] via SUBCUTANEOUS

## 2022-10-30 MED ORDER — INSULIN ASPART 100 UNIT/ML IJ SOLN
0.0000 [IU] | Freq: Every day | INTRAMUSCULAR | Status: DC
  Administered 2022-10-31: 2 [IU] via SUBCUTANEOUS

## 2022-10-30 MED ORDER — INSULIN GLARGINE-YFGN 100 UNIT/ML ~~LOC~~ SOLN
5.0000 [IU] | Freq: Every day | SUBCUTANEOUS | Status: DC
  Administered 2022-10-30 – 2022-10-31 (×2): 5 [IU] via SUBCUTANEOUS
  Filled 2022-10-30 (×2): qty 0.05

## 2022-10-30 NOTE — Progress Notes (Signed)
Mobility Specialist - Progress Note   10/30/22 1024  Mobility  Activity Ambulated with assistance in room  Level of Assistance Standby assist, set-up cues, supervision of patient - no hands on  Assistive Device Front wheel walker;Other (Comment) (Left Platform)  Distance Ambulated (ft) 20 ft  Range of Motion/Exercises Active  Activity Response Tolerated well  $Mobility charge 1 Mobility  Mobility Specialist Start Time (ACUTE ONLY) 1003  Mobility Specialist Stop Time (ACUTE ONLY) 1023  Mobility Specialist Time Calculation (min) (ACUTE ONLY) 20 min   Pt was found in bed and agreeable to ambulate in room. Pt was able to go STS with CG and SB for ambulation. Pt was left on BSC due to feeling gassy during session with RN in room.  Billey Chang Mobility Specialist

## 2022-10-30 NOTE — Progress Notes (Signed)
Physical Therapy Treatment Patient Details Name: [name] Nephew MRN: [id number] DOB: 02/12/1930 Today's Date: 10/30/2022   History of Present Illness Patient is a 89 year old female who presented to the hospital on 6/22 with weakness and significant diarrhea for past few days. Of note patient had recent carpal tunnel surgery on L wrist on 6/18.patient was admitted with hyponatremia, and hypothyroidism.  PMH: breast cancer, carpal tunnel syndrome bilaterally, anemia, asthma, osteoporosis, R THA,    PT Comments    The patient eager to ambulate. Patient  ambulated x 60' . Demonstrates much improvement in  mobility.  Recommendations for follow up therapy are one component of a multi-disciplinary discharge planning process, led by the attending physician.  Recommendations [place] be updated based on patient status, additional functional criteria and insurance authorization.  Follow Up Recommendations  Can patient physically be transported by private vehicle: Yes    Assistance Recommended at Discharge Frequent or constant Supervision/Assistance  Patient can return home with the following A little help with walking and/or transfers;A lot of help with bathing/dressing/bathroom;Assistance with cooking/housework;Help with stairs or ramp for entrance   Equipment Recommendations  None recommended by PT    Recommendations for Other Services       Precautions / Restrictions Precautions Precautions: Fall Precaution Comments: recent carpal tunnel surgery 6/18 with soft cast in place, assume NWB? Per ptt report: "told not to use for 2 weeks" Restrictions LUE Weight Bearing: Non weight bearing Other Position/Activity Restrictions: Per pt report: "told not to use for 2 weeks" (L hand/wrist)     Mobility  Bed Mobility   Bed Mobility: Sit to Supine       Sit to supine: Mod assist   General bed mobility comments: assist legs onto bed.    Transfers Overall transfer level: Needs assistance Equipment  used: Left platform walker Transfers: Sit to/from Stand Sit to Stand: Mod assist           General transfer comment: mod assist to rise from recliner to power up.    Ambulation/Gait Ambulation/Gait assistance: Min assist Gait Distance (Feet): 60 Feet Assistive device: Left platform walker Gait Pattern/deviations: Step-to pattern, Step-through pattern       General Gait Details: gait steady with PFRW, patient able to turn around multiple times walking in the room   Stairs             Wheelchair Mobility    Modified Rankin (Stroke Patients Only)       Balance Overall balance assessment: Needs assistance Sitting-balance support: Feet supported, Single extremity supported Sitting balance-Leahy Scale: Fair     Standing balance support: Reliant on assistive device for balance, Bilateral upper extremity supported, During functional activity Standing balance-Leahy Scale: Fair                              Cognition Arousal/Alertness: Awake/alert Behavior During Therapy: WFL for tasks assessed/performed Overall Cognitive Status: Within Functional Limits for tasks assessed                                 General Comments: patient  motivated to get up and ambualte        Exercises      General Comments        Pertinent Vitals/Pain Pain Assessment Pain Assessment: Faces Faces Pain Scale: Hurts little more Pain Descriptors / Indicators: Discomfort Pain Intervention(s): Monitored during  session, Repositioned    Home Living                          Prior Function            PT Goals (current goals can now be found in the care plan section) Progress towards PT goals: Progressing toward goals    Frequency    Min 1X/week      PT Plan Current plan remains appropriate    Co-evaluation              AM-PAC PT "6 Clicks" Mobility   Outcome Measure  Help needed turning from your back to your side while in  a flat bed without using bedrails?: A Lot Help needed moving from lying on your back to sitting on the side of a flat bed without using bedrails?: A Lot Help needed moving to and from a bed to a chair (including a wheelchair)?: A Lot Help needed standing up from a chair using your arms (e.g., wheelchair or bedside chair)?: A Little Help needed to walk in hospital room?: A Little Help needed climbing 3-5 steps with a railing? : Total 6 Click Score: 13    End of Session Equipment Utilized During Treatment: Gait belt Activity Tolerance: Patient tolerated treatment well Patient left: in chair;with call bell/phone within reach;with chair alarm set Nurse Communication: Mobility status PT Visit Diagnosis: Unsteadiness on feet (R26.81);Pain Pain - Right/Left: Left Pain - part of body: Hand     Time: 1610-9604 PT Time Calculation (min) (ACUTE ONLY): 23 min  Charges:  $Gait Training: 23-37 mins                     Blanchard Kelch PT Acute Rehabilitation Services Office [phone] Weekend pager-[phone]    Rada Hay 10/30/2022, 3:22 PM

## 2022-10-30 NOTE — Care Management Important Message (Signed)
Important Message  Patient Details IM Letter given. Name: [name] MRN: [id number] Date of Birth: 1929/08/03   Medicare Important Message Given:  Yes     Caren Macadam 10/30/2022, 12:01 PM

## 2022-10-30 NOTE — Progress Notes (Signed)
PROGRESS NOTE  [name]  DOB: May 19, 1929  PCP: Verlon Au, MD NWG:[id number]  DOA: 10/24/2022  LOS: 6 days  Hospital Day: 7  Brief narrative: [name] is a 89 y.o. female with PMH significant for DM2, HTN, HLD, carotid artery disease, chronic anemia, right breast cancer, colon cancer, chronic bronchitis, impaired mobility, osteoarthritis, hyponatremia, anxiety/depression. 6/22, patient presented to the hospital with few day history of generalized weakness, significant diarrhea, poor oral intake. Recently underwent left hand carpal tunnel surgery 6/18 by Dr. Bradly Bienenstock from [facility]  In the ED, patient was hemodynamically stable  Labs showed sodium level low at 160  Admitted to [facility]   Subjective: Patient was seen and examined this morning.  Pleasant elderly Caucasian female.  Sitting up in recliner.  Not in distress.  Sister at bedside. Chart reviewed In the last 24 hours, afebrile, hemodynamically stable Most recent labs from this morning with sodium 129  Assessment and plan: Acute on chronic hyponatremia Presented with low sodium level of 116 in the setting of poor oral intake, chronic diarrhea and recent use of tramadol.  Serum osmolality low at 263 urine osmolality low at 257, urine sodium low at 20.  Findings suggestive of hypovolemic hyponatremia. Started on treatment with IV fluid and sodium bicarb tablets. It seems previous hospitalist discussed informally with nephrologist on 6/25.  Recommended to continue inpatient care until sodium level is 130 or above. Currently on NS at 75 mill per hour, sodium bicarb 650 mg twice daily.  Replace with sodium chloride tablet Tramadol has been held. Recent Labs  Lab 10/24/22 1649 10/24/22 2253 10/25/22 0505 10/25/22 1226 10/25/22 2041 10/26/22 0426 10/27/22 0454 10/28/22 0500 10/29/22 0518 10/30/22 0515  NA 119* 122* 121* 125* 126* 127* 125* 126* 128* 129*    Left hand carpal tunnel S/p surgery surgery 6/18  by Dr. Bradly Bienenstock closed fx of proximal phalanx of middle left finger Noted to have significant swelling at the surgical site. Seen by Dr. Orlan Leavens on 6/26.  Recommended to keep wound wrapped and elevated Continue pain management with as needed Tylenol, as needed oxycodone To follow-up as an outpatient in a week Seen by PT/OT  Chronic diarrhea GIP panel negative Continue PTA cholestyramine   Essential hypertension Continue home verapamil  Type 2 diabetes mellitus A1c 6.9 on 10/24/2022 PTA meds-Lantus 10 units daily with sliding scale insulin Blood sugar trend as below. Resume Lantus at low-dose of 5 units daily.  Start SSI/Accu-Cheks Recent Labs  Lab 10/27/22 2034 10/28/22 0734 10/28/22 1123 10/28/22 1632 10/28/22 2101  GLUCAP 98 91 215* 168* 172*   Mild chronic anemia Continue PPI, vitamin B12 replacement Recent Labs    05/11/22 1126 06/11/22 0433 10/24/22 1233 10/26/22 0426 10/28/22 0500  HGB 12.1 11.6* 11.1* 10.2* 10.6*  MCV 93.2 92.9 90.5 90.4 90.7   Chronic tremor It seems patient was is on primidone twice daily and phenytoin PRN for tremors as an outpatient  Chronic anxiety Xanax as needed  Hypothyroidism Continue Synthroid   Intermittent left lower extremity swelling  Bilateral vascular Doppler negative for DVT Ace wrap ordered   Generalized weakness Likely multifactorial PT/OT-recommended SNF   Goals of care   Code Status: Full Code    DVT prophylaxis:  enoxaparin (LOVENOX) injection 40 mg Start: 10/24/22 2200   Antimicrobials: None Fluid: NS at 75 mill per hour Consultants: None Family Communication: Sister at bedside  Status: Inpatient Level of care:  Progressive   Patient from: Home Anticipated d/c to: Rehab  Needs to continue in-hospital care:  Pending improvement in sodium       Diet:  Diet Order             Diet regular Room service appropriate? Yes; Fluid consistency: Thin  Diet effective now                    Scheduled Meds:  cholestyramine light  4 g Oral BID   enoxaparin (LOVENOX) injection  40 mg Subcutaneous QHS   insulin aspart  0-5 Units Subcutaneous QHS   insulin aspart  0-6 Units Subcutaneous TID WC   insulin glargine-yfgn  5 Units Subcutaneous Daily   levothyroxine  75 mcg Oral QAC breakfast   pantoprazole  40 mg Oral Daily   phenytoin  300 mg Oral Daily   primidone  150 mg Oral QHS   primidone  250 mg Oral Daily   sodium chloride  1 g Oral TID WC   verapamil  180 mg Oral Daily    PRN meds: acetaminophen **OR** acetaminophen, ALPRAZolam, diclofenac Sodium, lip balm, methocarbamol, ondansetron **OR** ondansetron (ZOFRAN) IV, mouth rinse, oxyCODONE, oxyCODONE-acetaminophen, polyvinyl alcohol   Infusions:   sodium chloride 75 mL/hr at 10/29/22 2121    Antimicrobials: Anti-infectives (From admission, onward)    None       Nutritional status:  Body mass index is 22.24 kg/m.          Objective: Vitals:   10/30/22 0504 10/30/22 1322  BP: 138/63 139/64  Pulse: 68 66  Resp: 17 20  Temp: 98 F (36.7 C) 98 F (36.7 C)  SpO2: 95% 100%    Intake/Output Summary (Last 24 hours) at 10/30/2022 1430 Last data filed at 10/30/2022 1015 Gross per 24 hour  Intake 360 ml  Output 1550 ml  Net -1190 ml   Filed Weights   10/24/22 1600  Weight: 64.4 kg   Weight change:  Body mass index is 22.24 kg/m.   Physical Exam: General exam: Pleasant, elderly, not in physical distress Skin: No rashes, lesions or ulcers. HEENT: Atraumatic, normocephalic, no obvious bleeding Lungs: Clear to auscultation bilaterally CVS: Regular rate and rhythm, no murmur GI/Abd soft, nontender, nondistended, bowel sound present CNS: Alert, awake, oriented x 2, hard of hearing Psychiatry: Sad affect Extremities: Chronic mild swelling in left leg noted.  Data Review: I have personally reviewed the laboratory data and studies available.  F/u labs ordered Unresulted Labs (From admission,  onward)     Start     Ordered   10/31/22 0500  CBC with Differential/Platelet  Tomorrow morning,   R       Question:  Specimen collection method  Answer:  Lab=Lab collect   10/30/22 1430   10/31/22 0500  Basic metabolic panel  Tomorrow morning,   R       Question:  Specimen collection method  Answer:  Lab=Lab collect   10/30/22 1430            Total time spent in review of labs and imaging, patient evaluation, formulation of plan, documentation and communication with family: 55 minutes  Signed, Lorin Glass, MD [facility] 10/30/2022

## 2022-10-31 DIAGNOSIS — E871 Hypo-osmolality and hyponatremia: Secondary | ICD-10-CM | POA: Diagnosis not present

## 2022-10-31 LAB — CBC WITH DIFFERENTIAL/PLATELET
Abs Immature Granulocytes: 0.03 10*3/uL (ref 0.00–0.07)
Basophils Absolute: 0 10*3/uL (ref 0.0–0.1)
Basophils Relative: 1 %
Eosinophils Absolute: 0.1 10*3/uL (ref 0.0–0.5)
Eosinophils Relative: 2 %
HCT: 32 % — ABNORMAL LOW (ref 36.0–46.0)
Hemoglobin: 10.4 g/dL — ABNORMAL LOW (ref 12.0–15.0)
Immature Granulocytes: 1 %
Lymphocytes Relative: 21 %
Lymphs Abs: 1.1 10*3/uL (ref 0.7–4.0)
MCH: 30.1 pg (ref 26.0–34.0)
MCHC: 32.5 g/dL (ref 30.0–36.0)
MCV: 92.8 fL (ref 80.0–100.0)
Monocytes Absolute: 0.4 10*3/uL (ref 0.1–1.0)
Monocytes Relative: 7 %
Neutro Abs: 3.7 10*3/uL (ref 1.7–7.7)
Neutrophils Relative %: 68 %
Platelets: 351 10*3/uL (ref 150–400)
RBC: 3.45 MIL/uL — ABNORMAL LOW (ref 3.87–5.11)
RDW: 14 % (ref 11.5–15.5)
WBC: 5.4 10*3/uL (ref 4.0–10.5)
nRBC: 0 % (ref 0.0–0.2)

## 2022-10-31 LAB — GLUCOSE, CAPILLARY
Glucose-Capillary: 130 mg/dL — ABNORMAL HIGH (ref 70–99)
Glucose-Capillary: 243 mg/dL — ABNORMAL HIGH (ref 70–99)
Glucose-Capillary: 286 mg/dL — ABNORMAL HIGH (ref 70–99)
Glucose-Capillary: 239 mg/dL — ABNORMAL HIGH (ref 70–99)

## 2022-10-31 LAB — BASIC METABOLIC PANEL
Anion gap: 7 (ref 5–15)
BUN: 6 mg/dL — ABNORMAL LOW (ref 8–23)
CO2: 21 mmol/L — ABNORMAL LOW (ref 22–32)
Calcium: 7.7 mg/dL — ABNORMAL LOW (ref 8.9–10.3)
Chloride: 100 mmol/L (ref 98–111)
Creatinine, Ser: 0.48 mg/dL (ref 0.44–1.00)
GFR, Estimated: >60 mL/min (ref >60)
Glucose, Bld: 125 mg/dL — ABNORMAL HIGH (ref 70–99)
Potassium: 4 mmol/L (ref 3.5–5.1)
Sodium: 128 mmol/L — ABNORMAL LOW (ref 135–145)

## 2022-10-31 MED ORDER — INSULIN GLARGINE-YFGN 100 UNIT/ML ~~LOC~~ SOLN
10.0000 [IU] | Freq: Every day | SUBCUTANEOUS | Status: DC
  Administered 2022-11-01: 10 [IU] via SUBCUTANEOUS
  Filled 2022-10-31: qty 0.1

## 2022-10-31 NOTE — Progress Notes (Signed)
PROGRESS NOTE  [name] Single  DOB: 08-Feb-1930  PCP: Verlon Au, MD WUJ:[id number]  DOA: 10/24/2022  LOS: 7 days  Hospital Day: 8  Brief narrative: [name] is a 89 y.o. female with PMH significant for DM2, HTN, HLD, carotid artery disease, chronic anemia, right breast cancer, colon cancer, chronic bronchitis, impaired mobility, osteoarthritis, hyponatremia, anxiety/depression. 6/22, patient presented to the hospital with few day history of generalized weakness, significant diarrhea, poor oral intake. Recently underwent left hand carpal tunnel surgery 6/18 by Dr. Bradly Bienenstock from [facility]  In the ED, patient was hemodynamically stable  Labs showed sodium level low at 160  Admitted to [facility]   Subjective: Patient was seen and examined this morning. Lying on bed.  Not in distress.  IV fluid was stopped yesterday because of left upper extremity swelling. Sodium this morning down again to 128.  Assessment and plan: Acute on chronic hyponatremia Presented with low sodium level of 116 in the setting of poor oral intake, chronic diarrhea and recent use of tramadol.  Serum osmolality low at 263 urine osmolality low at 257, urine sodium low at 20.  Findings suggestive of hypovolemic hyponatremia. Started on treatment with IV fluid and sodium bicarb tablets. It seems previous hospitalist discussed informally with nephrologist on 6/25.  Recommended to continue inpatient care until sodium level is 130 or above. She was hydrated with IV normal saline.  It was stopped yesterday because of left abdomen swelling.  Sodium level down to 128 today.  Yesterday, she was also started on salt tablets.  I will continue salt tablet today.  Repeat sodium level tomorrow. Tramadol has been held. Recent Labs  Lab 10/24/22 2253 10/25/22 0505 10/25/22 1226 10/25/22 2041 10/26/22 0426 10/27/22 0454 10/28/22 0500 10/29/22 0518 10/30/22 0515 10/31/22 0627  NA 122* 121* 125* 126* 127* 125* 126* 128*  129* 128*    Left hand carpal tunnel S/p surgery surgery 6/18 by Dr. Bradly Bienenstock closed fx of proximal phalanx of middle left finger Noted to have significant swelling at the surgical site. Seen by Dr. Orlan Leavens on 6/26.  Recommended to keep wound wrapped and elevated Continue pain management with as needed Tylenol, as needed oxycodone To follow-up as an outpatient in a week Seen by PT/OT  Chronic diarrhea GIP panel negative Continue PTA cholestyramine   Essential hypertension Continue home verapamil  Type 2 diabetes mellitus A1c 6.9 on 10/24/2022 PTA meds-Lantus 10 units daily with sliding scale insulin Currently Lantus 5 units daily and SSI with Accu-Cheks. Blood sugar trend as below, rising up.  Increase Lantus to 10 units from tomorrow. Recent Labs  Lab 10/28/22 2101 10/30/22 1707 10/30/22 2024 10/31/22 0734 10/31/22 1151  GLUCAP 172* 268* 175* 130* 243*    Mild chronic anemia Continue PPI, vitamin B12 replacement Recent Labs    06/11/22 0433 10/24/22 1233 10/26/22 0426 10/28/22 0500 10/31/22 0627  HGB 11.6* 11.1* 10.2* 10.6* 10.4*  MCV 92.9 90.5 90.4 90.7 92.8    Chronic tremor It seems patient was is on primidone twice daily and phenytoin PRN for tremors as an outpatient  Chronic anxiety Xanax as needed  Hypothyroidism Continue Synthroid   Intermittent left lower extremity swelling  Bilateral vascular Doppler negative for DVT Ace wrap ordered   Generalized weakness Likely multifactorial PT/OT-recommended SNF   Goals of care   Code Status: Full Code    DVT prophylaxis:  enoxaparin (LOVENOX) injection 40 mg Start: 10/24/22 2200   Antimicrobials: None Fluid: None Consultants: None Family Communication: Sister  at bedside  Status: Inpatient Level of care:  Progressive   Patient from: Home Anticipated d/c to: Rehab Needs to continue in-hospital care:  Pending improvement in sodium.  Likely discharge to rehab tomorrow       Diet:   Diet Order             Diet regular Room service appropriate? Yes; Fluid consistency: Thin  Diet effective now                   Scheduled Meds:  cholestyramine light  4 g Oral BID   enoxaparin (LOVENOX) injection  40 mg Subcutaneous QHS   insulin aspart  0-5 Units Subcutaneous QHS   insulin aspart  0-6 Units Subcutaneous TID WC   insulin glargine-yfgn  5 Units Subcutaneous Daily   levothyroxine  75 mcg Oral QAC breakfast   pantoprazole  40 mg Oral Daily   phenytoin  300 mg Oral Daily   primidone  150 mg Oral QHS   primidone  250 mg Oral Daily   sodium chloride  1 g Oral TID WC   verapamil  180 mg Oral Daily    PRN meds: acetaminophen **OR** acetaminophen, ALPRAZolam, diclofenac Sodium, lip balm, methocarbamol, ondansetron **OR** ondansetron (ZOFRAN) IV, mouth rinse, oxyCODONE, oxyCODONE-acetaminophen, polyvinyl alcohol   Infusions:   sodium chloride 75 mL/hr at 10/29/22 2121    Antimicrobials: Anti-infectives (From admission, onward)    None       Nutritional status:  Body mass index is 22.24 kg/m.          Objective: Vitals:   10/31/22 0458 10/31/22 1237  BP: 125/60 (!) 141/65  Pulse: 65 60  Resp: 18 20  Temp: 98.1 F (36.7 C) 98.2 F (36.8 C)  SpO2: 96% 99%    Intake/Output Summary (Last 24 hours) at 10/31/2022 1601 Last data filed at 10/31/2022 0904 Gross per 24 hour  Intake 240 ml  Output 1850 ml  Net -1610 ml    Filed Weights   10/24/22 1600  Weight: 64.4 kg   Weight change:  Body mass index is 22.24 kg/m.   Physical Exam: General exam: Pleasant, elderly, not in physical distress Skin: No rashes, lesions or ulcers. HEENT: Atraumatic, normocephalic, no obvious bleeding Lungs: Clear to auscultation bilaterally CVS: Regular rate and rhythm, no murmur GI/Abd soft, nontender, nondistended, bowel sound present CNS: Alert, awake, oriented x 2, hard of hearing Psychiatry: Mood appropriate Extremities: Chronic mild swelling in left  leg noted.  Data Review: I have personally reviewed the laboratory data and studies available.  F/u labs ordered Unresulted Labs (From admission, onward)     Start     Ordered   11/01/22 0500  Basic metabolic panel  Tomorrow morning,   R       Question:  Specimen collection method  Answer:  Lab=Lab collect   10/31/22 0810            Total time spent in review of labs and imaging, patient evaluation, formulation of plan, documentation and communication with family: 45 minutes  Signed, Lorin Glass, MD [facility] 10/31/2022

## 2022-10-31 NOTE — Progress Notes (Signed)
Mobility Specialist - Progress Note   10/31/22 1002  Mobility  Activity Transferred to/from [facility]  Level of Assistance Maximum assist, patient does 25-49%  Assistive Device BSC  Distance Ambulated (ft) 6 ft  Range of Motion/Exercises Active Assistive  LUE Weight Bearing NWB  Activity Response Tolerated fair  $Mobility charge 1 Mobility  Mobility Specialist Start Time (ACUTE ONLY) [id number]  Mobility Specialist Stop Time (ACUTE ONLY) 1002  Mobility Specialist Time Calculation (min) (ACUTE ONLY) 37 min   Pt was found in bed and agreeable to ambulate. Required max-A for bed mobility and once sitting EOB stated needing to use BSC. Was max-A for STS as well and was very fatigued with transfer to and from [facility] which limited ambulation. At EOS was left in bed with RN in room.  Billey Chang Mobility Specialist

## 2022-11-01 DIAGNOSIS — E871 Hypo-osmolality and hyponatremia: Secondary | ICD-10-CM | POA: Diagnosis not present

## 2022-11-01 LAB — GLUCOSE, CAPILLARY
Glucose-Capillary: 124 mg/dL — ABNORMAL HIGH (ref 70–99)
Glucose-Capillary: 202 mg/dL — ABNORMAL HIGH (ref 70–99)

## 2022-11-01 LAB — BASIC METABOLIC PANEL
Anion gap: 9 (ref 5–15)
BUN: 8 mg/dL (ref 8–23)
CO2: 23 mmol/L (ref 22–32)
Calcium: 7.9 mg/dL — ABNORMAL LOW (ref 8.9–10.3)
Chloride: 99 mmol/L (ref 98–111)
Creatinine, Ser: 0.58 mg/dL (ref 0.44–1.00)
GFR, Estimated: >60 mL/min (ref >60)
Glucose, Bld: 131 mg/dL — ABNORMAL HIGH (ref 70–99)
Potassium: 4 mmol/L (ref 3.5–5.1)
Sodium: 131 mmol/L — ABNORMAL LOW (ref 135–145)

## 2022-11-01 MED ORDER — SODIUM CHLORIDE 1 G PO TABS: 1.0000 g | ORAL_TABLET | Freq: Three times a day (TID) | ORAL | Status: AC

## 2022-11-01 MED ORDER — OXYCODONE-ACETAMINOPHEN 5-325 MG PO TABS: 1.0000 | ORAL_TABLET | Freq: Four times a day (QID) | ORAL | 0 refills | Status: AC | PRN

## 2022-11-01 MED ORDER — INSULIN ASPART 100 UNIT/ML IJ SOLN: 0.0000 [IU] | Freq: Three times a day (TID) | INTRAMUSCULAR | 11 refills | Status: DC

## 2022-11-01 MED ORDER — INSULIN ASPART 100 UNIT/ML IJ SOLN: 0.0000 [IU] | Freq: Every day | INTRAMUSCULAR | 11 refills | Status: DC

## 2022-11-01 MED ORDER — CHOLESTYRAMINE LIGHT 4 G PO PACK: 4.0000 g | PACK | Freq: Two times a day (BID) | ORAL | Status: DC

## 2022-11-01 MED ORDER — ALPRAZOLAM 0.25 MG PO TABS: 0.1250 mg | ORAL_TABLET | Freq: Two times a day (BID) | ORAL | 0 refills | Status: AC | PRN

## 2022-11-01 MED ORDER — DIPHENHYDRAMINE HCL 25 MG PO CAPS
25.0000 mg | ORAL_CAPSULE | Freq: Three times a day (TID) | ORAL | Status: DC | PRN
  Administered 2022-11-01: 25 mg via ORAL
  Filled 2022-11-01: qty 1

## 2022-11-01 NOTE — Discharge Summary (Signed)
Physician Discharge Summary  [name] ZOX:[id number] DOB: 12-02-1929 DOA: 10/24/2022  PCP: Verlon Au, MD  Admit date: 10/24/2022 Discharge date: 11/01/2022  Admitted From: Home Discharge disposition: SNF  Recommendations at discharge:  Tramadol has been switched to oral oxycodone as needed. Take salt tablets for 1 more week Repeat BMP in a week   Brief narrative: [name] is a 89 y.o. female with PMH significant for DM2, HTN, HLD, carotid artery disease, chronic anemia, right breast cancer, colon cancer, chronic bronchitis, impaired mobility, osteoarthritis, hyponatremia, anxiety/depression. 6/22, patient presented to the hospital with few day history of generalized weakness, significant diarrhea, poor oral intake. Recently underwent left hand carpal tunnel surgery 6/18 by Dr. Bradly Bienenstock from [facility]  In the ED, patient was hemodynamically stable  Labs showed sodium level low at 160  Admitted to [facility]   Subjective: Patient was seen and examined this morning. Propped up in bed.  Taking her breakfast.  Not in distress no new symptoms.  Sodium this morning better at 131.  Hospital course: Acute on chronic hyponatremia Presented with low sodium level of 116 in the setting of poor oral intake, chronic diarrhea and recent use of tramadol.  Serum osmolality low at 263 urine osmolality low at 257, urine sodium low at 20.  Findings suggestive of hypovolemic hyponatremia. Started on treatment with IV fluid and sodium bicarb tablets. It seems previous hospitalist discussed informally with nephrologist on 6/25.  Recommended to continue inpatient care until sodium level is 130 or above. She was hydrated with IV normal saline.  It was stopped eventually because of edema.  She was continued on salt tablets.  Sodium level improving, 131 today.  Continue salt tablet 3 times daily for 7 days.   Tramadol has been held. Recent Labs  Lab 10/25/22 1226 10/25/22 2041 10/26/22 0426  10/27/22 0454 10/28/22 0500 10/29/22 0518 10/30/22 0515 10/31/22 0627 11/01/22 0530  NA 125* 126* 127* 125* 126* 128* 129* 128* 131*   Left hand carpal tunnel S/p surgery surgery 6/18 by Dr. Bradly Bienenstock closed fx of proximal phalanx of middle left finger Noted to have significant swelling at the surgical site. Seen by Dr. Orlan Leavens on 6/26.  Recommended to keep wound wrapped and elevated Continue pain management with as needed Tylenol, as needed oxycodone.  Tramadol held because of potential to relapse hyponatremia. To follow-up as an outpatient in a week Seen by PT/OT  Chronic diarrhea GIP panel negative Continue PTA cholestyramine   Essential hypertension Continue home verapamil  Type 2 diabetes mellitus A1c 6.9 on 10/24/2022 PTA meds-Lantus 10 units daily with sliding scale insulin Continue same post discharge. Recent Labs  Lab 10/31/22 0734 10/31/22 1151 10/31/22 1728 10/31/22 2015 11/01/22 0726  GLUCAP 130* 243* 286* 239* 124*   Mild chronic anemia Continue PPI, vitamin B12 replacement Recent Labs    06/11/22 0433 10/24/22 1233 10/26/22 0426 10/28/22 0500 10/31/22 0627  HGB 11.6* 11.1* 10.2* 10.6* 10.4*  MCV 92.9 90.5 90.4 90.7 92.8   Chronic tremor It seems patient was is on primidone twice daily and phenytoin PRN for tremors as an outpatient  Chronic anxiety Xanax as needed  Hypothyroidism Continue Synthroid   Intermittent left lower extremity swelling  Bilateral vascular Doppler negative for DVT Ace wrap ordered   Generalized weakness Likely multifactorial PT/OT-recommended SNF   Goals of care   Code Status: Full Code   Wounds:  -    Discharge Exam:   Vitals:   10/31/22 0458 10/31/22 1237  10/31/22 2136 11/01/22 0416  BP: 125/60 (!) 141/65 138/67 (!) 140/61  Pulse: 65 60 65 65  Resp: 18 20 18 18   Temp: 98.1 F (36.7 C) 98.2 F (36.8 C) 98.2 F (36.8 C) 98.2 F (36.8 C)  TempSrc: Oral Oral Oral Oral  SpO2: 96% 99% 96% 97%   Weight:      Height:        Body mass index is 22.24 kg/m.  General exam: Pleasant, elderly, not in physical distress Skin: No rashes, lesions or ulcers. HEENT: Atraumatic, normocephalic, no obvious bleeding Lungs: Clear to auscultation bilaterally CVS: Regular rate and rhythm, no murmur GI/Abd soft, nontender, nondistended, bowel sound present CNS: Alert, awake, oriented x 2, hard of hearing Psychiatry: Mood appropriate Extremities: Chronic mild swelling in legs improving after ace wrapping  Follow ups:    Contact information for follow-up providers     Bradly Bienenstock, MD Follow up in 1 week(s).   Specialty: Orthopedic Surgery        Verlon Au, MD Follow up.   Specialty: Family Medicine Contact information: [address] [place] [address] [phone]              Contact information for after-discharge care     Destination     [facility] Preferred SNF .   Service: Skilled Nursing Contact information: [address] [place] [address] [phone]                     Discharge Instructions:   Discharge Instructions     Call MD for:  difficulty breathing, headache or visual disturbances   Complete by: As directed    Call MD for:  extreme fatigue   Complete by: As directed    Call MD for:  hives   Complete by: As directed    Call MD for:  persistant dizziness or light-headedness   Complete by: As directed    Call MD for:  persistant nausea and vomiting   Complete by: As directed    Call MD for:  severe uncontrolled pain   Complete by: As directed    Call MD for:  temperature >100.4   Complete by: As directed    Diet Carb Modified   Complete by: As directed    Discharge instructions   Complete by: As directed    Recommendations at discharge:   Tramadol has been switched to oral oxycodone as needed.  Take salt tablets for 1 more week  Repeat BMP in a week  General  discharge instructions: Follow with Primary MD Verlon Au, MD in 7 days  Please request your PCP  to go over your hospital tests, procedures, radiology results at the follow up. Please get your medicines reviewed and adjusted.  Your PCP Carlile decide to repeat certain labs or tests as needed. Do not drive, operate heavy machinery, perform activities at heights, swimming or participation in water activities or provide baby sitting services if your were admitted for syncope or siezures until you have seen by Primary MD or a Neurologist and advised to do so again. [place] Controlled Substance Reporting System database was reviewed. Do not drive, operate heavy machinery, perform activities at heights, swim, participate in water activities or provide baby-sitting services while on medications for pain, sleep and mood until your outpatient physician has reevaluated you and advised to do so again.  You are strongly recommended to comply with the dose, frequency  and duration of prescribed medications. Activity: As tolerated with Full fall precautions use walker/cane & assistance as needed Avoid using any recreational substances like cigarette, tobacco, alcohol, or non-prescribed drug. If you experience worsening of your admission symptoms, develop shortness of breath, life threatening emergency, suicidal or homicidal thoughts you must seek medical attention immediately by calling 911 or calling your MD immediately  if symptoms less severe. You must read complete instructions/literature along with all the possible adverse reactions/side effects for all the medicines you take and that have been prescribed to you. Take any new medicine only after you have completely understood and accepted all the possible adverse reactions/side effects.  Wear Seat belts while driving. You were cared for by a hospitalist during your hospital stay. If you have any questions about your discharge medications or the care  you received while you were in the hospital after you are discharged, you can call the unit and ask to speak with the hospitalist or the covering physician. Once you are discharged, your primary care physician will handle any further medical issues. Please note that NO REFILLS for any discharge medications will be authorized once you are discharged, as it is imperative that you return to your primary care physician (or establish a relationship with a primary care physician if you do not have one).   Increase activity slowly   Complete by: As directed        Discharge Medications:   Allergies as of 11/01/2022       Reactions   Bystolic [nebivolol Hcl] Other (See Comments)   "extreme weakness, heaviness in legs & arms, swelling in legs/arms/face, swollen abdomen, pain in bladder, feet pain, soreness in chest"   Carbamazepine Other (See Comments)   Blood poisoning   Cholestyramine Itching, Nausea And Vomiting, Rash, Other (See Comments)   "itching rash on stomach, bloated, nausea, vomiting, sleeplessness, extreme pain in arms"  Patient is taking this in 2022.   Morphine Other (See Comments)   Feels morbid, weak, still in pain   Niacin Palpitations   Fast heart beat Other reaction(s): Unknown   Norvasc [amlodipine Besylate] Other (See Comments)   "extreme fluid retention/pain)   Optivar [azelastine Hcl] Photosensitivity   Repaglinide Hives   Sular [nisoldipine Er] Other (See Comments)   "severe headaches, swelling eyes, hands, feet, shortness of breath, weak, flushed face, brain boiling, fluid retention, high blood sugar, nervous, heart fast beating"   Telmisartan Other (See Comments)   "headache, difficulty urinating, high blood sugar, fluid retention"   Amoxicillin-pot Clavulanate Rash   Tolerates Unasyn (>10 doses)   Cefdinir Swelling   Vaginal irritation, breathing,    Ciprofloxacin Hcl Hives   Clonidine Other (See Comments)   Dry mouth, fluid retention   Codeine Nausea And  Vomiting   Ezetimibe Other (See Comments)   Made weak   Hydroxychloroquine Other (See Comments)   Low platlets   Naproxen Other (See Comments)   Shrinks bladder   Sulfa Antibiotics Rash   Ziac [bisoprolol-hydrochlorothiazide] Other (See Comments)   "stopped urination"   Amlodipine Besy-benazepril Hcl Cough   Azelastine Other (See Comments)   Unknown reaction - Per MAR   Elavil [amitriptyline] Other (See Comments)   Gave Pt nightmares   Empagliflozin Other (See Comments)   "Caused yeast infection, slowed my urine"   Hydralazine Other (See Comments)   "does not reduce high blood pressure, pain in arm, high pressure, felt like I was on verge of heart attack, really weak"   Iodine I  131 Tositumomab Other (See Comments)   Unknown reaction - Per MAR   Kenalog [triamcinolone] Diarrhea   Per MAR   Keppra [levetiracetam] Other (See Comments)   Shaking   Lamotrigine Itching, Other (See Comments)   Pregabalin Swelling, Other (See Comments)   Weight gain   Pseudoephedrine Other (See Comments)   Unknown reaction   Pseudoephedrine-guaifenesin Er Other (See Comments)   Unknown reaction - Per MAR   Risedronate    Unknown reaction - Per MAR   Ru-hist D [brompheniramine-phenylephrine] Other (See Comments)   Unknown reaction - Per MAR   Sumatriptan Other (See Comments)   Topiramate Other (See Comments)   Dry eyes   Ace Inhibitors Other (See Comments)   unknown   Aspirin Other (See Comments)   Unknown reaction   Atacand [candesartan] Other (See Comments)   Unknown reaction - MAR   Bextra [valdecoxib] Other (See Comments)   Unknown reaction - Per MAR   Bisoprolol-hydrochlorothiazide Other (See Comments)   Unknown reaction - Per MAR   Cefadroxil Other (See Comments)   Unknown reaction   Celecoxib Rash   Hydrocodone Other (See Comments)   unknown   Hydrocodone-acetaminophen Other (See Comments)   unknown   Iodinated Contrast Media Rash, Other (See Comments)   "All over" rash    Meloxicam Other (See Comments)   unknown   Methylprednisolone Sodium Succinate Other (See Comments)   unknown   Nabumetone Other (See Comments)   Unknown reaction   Penicillins Other (See Comments)   unknown   Pseudoephedrine-guaifenesin Other (See Comments)   unknown   Rofecoxib Other (See Comments)   Unknown reaction - MAR Other reaction(s): Unknown   Ru-tuss [chlorphen-pse-atrop-hyos-scop] Other (See Comments)   unknown   Sulfonamide Derivatives Other (See Comments)   unknown   Sulphur [elemental Sulfur] Other (See Comments)   unknown   Telithromycin Other (See Comments)   unknown   Terfenadine Other (See Comments)   Unknown reaction - Per MAR   Trandolapril-verapamil Hcl Er Other (See Comments)   Headache, difficulty urinating, high blood sugar, fluid retention Pt is taking Tarka (trandolapril-verapamil) currently, but requests the medication stay in her allergy list   Valium [diazepam] Other (See Comments)   Makes her mean and hyper        Medication List     STOP taking these medications    traMADol 50 MG tablet Commonly known as: ULTRAM       TAKE these medications    acetaminophen 500 MG tablet Commonly known as: TYLENOL Take 2 tablets (1,000 mg total) by mouth 3 (three) times daily. What changed:  when to take this reasons to take this   ALPRAZolam 0.25 MG tablet Commonly known as: XANAX Take 0.5 tablets (0.125 mg total) by mouth 2 (two) times daily as needed for up to 5 days for anxiety.   Biotin 5000 MCG Caps Take 5,000 mcg by mouth daily.   capsicum 0.075 % topical cream Commonly known as: ZOSTRIX Apply 1 Application topically 4 (four) times daily as needed (pain).   cholestyramine 4 g packet Commonly known as: QUESTRAN Take 4 g by mouth 2 (two) times daily.   cholestyramine light 4 g packet Commonly known as: PREVALITE Take 1 packet (4 g total) by mouth 2 (two) times daily.   cyanocobalamin 1000 MCG/ML injection Commonly known as:  VITAMIN B12 Inject 1,000 mcg into the muscle every 21 ( twenty-one) days.   diclofenac Sodium 1 % Gel Commonly known as: VOLTAREN Apply 2 g  topically 4 (four) times daily as needed (for knee and back pain).   diphenhydrAMINE 25 mg capsule Commonly known as: BENADRYL Take 25 mg by mouth every 8 (eight) hours as needed for allergies or sleep.   Glucose Meter Test test strip Generic drug: glucose blood See admin instructions.   PRODIGY VOICE BLOOD GLUCOSE VI   insulin aspart 100 UNIT/ML FlexPen Commonly known as: NOVOLOG Inject 2-12 Units into the skin See admin instructions. Injects 2-10 units of insulin under the skin 3 times a day per sliding scale: CBG 0-150: 2 units; CBG 151-200: 4 units; CBG 201-250: 6 units; CBG 251-300: 8 units; CBG 301-350: 10 units; CBG 351-400: 12 units; CBG >400: 12 units and notify the MD/NP What changed: additional instructions   insulin aspart 100 UNIT/ML injection Commonly known as: novoLOG Inject 0-5 Units into the skin at bedtime. What changed: You were already taking a medication with the same name, and this prescription was added. Make sure you understand how and when to take each.   insulin aspart 100 UNIT/ML injection Commonly known as: novoLOG Inject 0-6 Units into the skin 3 (three) times daily with meals. What changed: You were already taking a medication with the same name, and this prescription was added. Make sure you understand how and when to take each.   Insulin Pen Needle 32G X 4 MM Misc Used to inject insulin 3x daily What changed:  how much to take how to take this when to take this   Lantus SoloStar 100 UNIT/ML Solostar Pen Generic drug: insulin glargine Inject 10 Units into the skin at bedtime. What changed:  when to take this additional instructions   levothyroxine 75 MCG tablet Commonly known as: SYNTHROID Take 1 tablet (75 mcg total) by mouth daily before breakfast.   mupirocin ointment 2 % Commonly known as:  BACTROBAN Apply 1 Application topically 2 (two) times daily.   oxyCODONE-acetaminophen 5-325 MG tablet Commonly known as: PERCOCET/ROXICET Take 1 tablet by mouth every 6 (six) hours as needed for up to 5 days for moderate pain.   pantoprazole 40 MG tablet Commonly known as: PROTONIX Take 40 mg by mouth daily.   Phenytek 300 MG ER capsule Generic drug: phenytoin TAKE 1 CAPSULE AT BEDTIME What changed: when to take this   phenytoin 50 MG tablet Commonly known as: DILANTIN Chew 50 mg by mouth See admin instructions. Takes along with 300 mg when needed for tremor.   polyethylene glycol 17 g packet Commonly known as: MIRALAX / GLYCOLAX Take 17 g by mouth daily as needed for mild constipation.   primidone 50 MG tablet Commonly known as: MYSOLINE Take 3 tablets (150 mg total) by mouth at bedtime.   primidone 250 MG tablet Commonly known as: MYSOLINE Take 1 tablet (250 mg total) by mouth daily.   Prodigy Voice Blood Glucose w/Device Kit Use to check blood sugar 1 time per day.   sodium chloride 1 g tablet Take 1 tablet (1 g total) by mouth 3 (three) times daily with meals for 7 days.   Systane 0.4-0.3 % Soln Generic drug: Polyethyl Glycol-Propyl Glycol Place 2 drops into both eyes 3 (three) times daily.   thiamine 250 MG tablet Take 250 mg by mouth daily.   Verapamil HCl CR 200 MG Cp24 Take 200 mg by mouth daily.   Vitamin D-3 125 MCG (5000 UT) Tabs Take 5,000 Units by mouth daily.         The results of significant diagnostics from this hospitalization (  including imaging, microbiology, ancillary and laboratory) are listed below for reference.    Procedures and Diagnostic Studies:   VAS [place] LOWER EXTREMITY VENOUS (DVT)  Result Date: 10/25/2022  Lower Venous DVT Study Patient Name:  Bronnie S Jubb   Date of Exam:   10/25/2022 Medical Rec #: [id number]   Accession #:    [id number] Date of Birth: 1930-02-10  Patient Gender: F Patient Age:   9 years Exam Location:  [facility] Procedure:      VAS [place] LOWER EXTREMITY VENOUS (DVT) Referring Phys: Pamella Pert --------------------------------------------------------------------------------  Indications: Edema.  Comparison Study: Previous RLEV on 04/21/22 was negative for DVT Performing Technologist: Ernestene Mention RVT, RDMS  Examination Guidelines: A complete evaluation includes B-mode imaging, spectral Doppler, color Doppler, and power Doppler as needed of all accessible portions of each vessel. Bilateral testing is considered an integral part of a complete examination. Limited examinations for reoccurring indications Roemer be performed as noted. The reflux portion of the exam is performed with the patient in reverse Trendelenburg.  +---------+---------------+---------+-----------+----------+--------------+ RIGHT    CompressibilityPhasicitySpontaneityPropertiesThrombus Aging +---------+---------------+---------+-----------+----------+--------------+ CFV      Full           Yes      Yes                                 +---------+---------------+---------+-----------+----------+--------------+ SFJ      Full                                                        +---------+---------------+---------+-----------+----------+--------------+ FV Prox  Full           Yes      Yes                                 +---------+---------------+---------+-----------+----------+--------------+ FV Mid   Full           Yes      Yes                                 +---------+---------------+---------+-----------+----------+--------------+ FV DistalFull           Yes      Yes                                 +---------+---------------+---------+-----------+----------+--------------+ PFV      Full                                                        +---------+---------------+---------+-----------+----------+--------------+ POP      Full           Yes      Yes                                  +---------+---------------+---------+-----------+----------+--------------+ PTV      Full                                                        +---------+---------------+---------+-----------+----------+--------------+  PERO     Full                                                        +---------+---------------+---------+-----------+----------+--------------+   +---------+---------------+---------+-----------+----------+--------------+ LEFT     CompressibilityPhasicitySpontaneityPropertiesThrombus Aging +---------+---------------+---------+-----------+----------+--------------+ CFV      Full           Yes      Yes                                 +---------+---------------+---------+-----------+----------+--------------+ SFJ      Full                                                        +---------+---------------+---------+-----------+----------+--------------+ FV Prox  Full           Yes      Yes                                 +---------+---------------+---------+-----------+----------+--------------+ FV Mid   Full           Yes      Yes                                 +---------+---------------+---------+-----------+----------+--------------+ FV DistalFull           Yes      Yes                                 +---------+---------------+---------+-----------+----------+--------------+ PFV      Full                                                        +---------+---------------+---------+-----------+----------+--------------+ POP      Full           Yes      Yes                                 +---------+---------------+---------+-----------+----------+--------------+ PTV      Full                                                        +---------+---------------+---------+-----------+----------+--------------+ PERO     Full                                                         +---------+---------------+---------+-----------+----------+--------------+  Summary: BILATERAL: - No evidence of deep vein thrombosis seen in the lower extremities, bilaterally. -No evidence of popliteal cyst, bilaterally.   *See table(s) above for measurements and observations. Electronically signed by Waverly Ferrari MD on 10/25/2022 at 12:13:51 PM.    Final    DG Abd Portable 2V  Result Date: 10/24/2022 CLINICAL DATA:  Right lower quadrant abdominal pain and black stools EXAM: PORTABLE ABDOMEN - 2 VIEW COMPARISON:  Abdominal radiograph dated 01/25/2022 FINDINGS: Nonobstructive bowel gas pattern. Mildly dilated gas-filled loop of bowel in the right lower quadrant. No free air or pneumatosis. No abnormal radio-opaque calculi or mass effect. No acute or substantial osseous abnormality. The sacrum and coccyx are partially obscured by overlying bowel contents. Partially imaged right hip arthroplasty hardware appears intact. Partially imaged vertebral augmentation at T12. Right upper quadrant surgical clips. IMPRESSION: Nonobstructive bowel gas pattern. Mildly dilated gas-filled loop of bowel in the right lower quadrant, which Rafuse represent ileus. Electronically Signed   By: Agustin Cree M.D.   On: 10/24/2022 15:08     Labs:   Basic Metabolic Panel: Recent Labs  Lab 10/28/22 0500 10/29/22 0518 10/30/22 0515 10/31/22 0627 11/01/22 0530  NA 126* 128* 129* 128* 131*  K 3.6 3.4* 4.4 4.0 4.0  CL 102 102 103 100 99  CO2 19* 20* 20* 21* 23  GLUCOSE 83 136* 168* 125* 131*  BUN <5* <5* <5* 6* 8  CREATININE 0.52 0.53 0.52 0.48 0.58  CALCIUM 7.4* 7.4* 7.7* 7.7* 7.9*   GFR Estimated Creatinine Clearance: 45.5 mL/min (by C-G formula based on SCr of 0.58 mg/dL). Liver Function Tests: No results for input(s): "AST", "ALT", "ALKPHOS", "BILITOT", "PROT", "ALBUMIN" in the last 168 hours. No results for input(s): "LIPASE", "AMYLASE" in the last 168 hours. No results for input(s): "AMMONIA" in the last  168 hours. Coagulation profile No results for input(s): "INR", "PROTIME" in the last 168 hours.  CBC: Recent Labs  Lab 10/26/22 0426 10/28/22 0500 10/31/22 0627  WBC 6.5 7.2 5.4  NEUTROABS 4.6  --  3.7  HGB 10.2* 10.6* 10.4*  HCT 30.0* 31.3* 32.0*  MCV 90.4 90.7 92.8  PLT 306 339 351   Cardiac Enzymes: No results for input(s): "CKTOTAL", "CKMB", "CKMBINDEX", "TROPONINI" in the last 168 hours. BNP: Invalid input(s): "POCBNP" CBG: Recent Labs  Lab 10/31/22 0734 10/31/22 1151 10/31/22 1728 10/31/22 2015 11/01/22 0726  GLUCAP 130* 243* 286* 239* 124*   D-Dimer No results for input(s): "DDIMER" in the last 72 hours. Hgb A1c No results for input(s): "HGBA1C" in the last 72 hours. Lipid Profile No results for input(s): "CHOL", "HDL", "LDLCALC", "TRIG", "CHOLHDL", "LDLDIRECT" in the last 72 hours. Thyroid function studies No results for input(s): "TSH", "T4TOTAL", "T3FREE", "THYROIDAB" in the last 72 hours.  Invalid input(s): "FREET3" Anemia work up No results for input(s): "VITAMINB12", "FOLATE", "FERRITIN", "TIBC", "IRON", "RETICCTPCT" in the last 72 hours. Microbiology Recent Results (from the past 240 hour(s))  MRSA Next Gen by PCR, Nasal     Status: None   Collection Time: 10/24/22  3:42 PM   Specimen: Nasal Mucosa; Nasal Swab  Result Value Ref Range Status   MRSA by PCR Next Gen NOT DETECTED NOT DETECTED Final    Comment: (NOTE) The GeneXpert MRSA Assay (FDA approved for NASAL specimens only), is one component of a comprehensive MRSA colonization surveillance program. It is not intended to diagnose MRSA infection nor to guide or monitor treatment for MRSA infections. Test performance is not FDA approved in patients less than  87 years old. Performed at [facility], [address] [place], [place] [address]   Gastrointestinal Panel by PCR , Stool     Status: None   Collection Time: 10/25/22  9:21 AM   Specimen: Stool  Result Value Ref  Range Status   Campylobacter species NOT DETECTED NOT DETECTED Final   Plesimonas shigelloides NOT DETECTED NOT DETECTED Final   Salmonella species NOT DETECTED NOT DETECTED Final   Yersinia enterocolitica NOT DETECTED NOT DETECTED Final   Vibrio species NOT DETECTED NOT DETECTED Final   Vibrio cholerae NOT DETECTED NOT DETECTED Final   Enteroaggregative E coli (EAEC) NOT DETECTED NOT DETECTED Final   Enteropathogenic E coli (EPEC) NOT DETECTED NOT DETECTED Final   Enterotoxigenic E coli (ETEC) NOT DETECTED NOT DETECTED Final   Shiga like toxin producing E coli (STEC) NOT DETECTED NOT DETECTED Final   Shigella/Enteroinvasive E coli (EIEC) NOT DETECTED NOT DETECTED Final   Cryptosporidium NOT DETECTED NOT DETECTED Final   Cyclospora cayetanensis NOT DETECTED NOT DETECTED Final   Entamoeba histolytica NOT DETECTED NOT DETECTED Final   Giardia lamblia NOT DETECTED NOT DETECTED Final   Adenovirus F40/41 NOT DETECTED NOT DETECTED Final   Astrovirus NOT DETECTED NOT DETECTED Final   Norovirus GI/GII NOT DETECTED NOT DETECTED Final   Rotavirus A NOT DETECTED NOT DETECTED Final   Sapovirus (I, II, IV, and V) NOT DETECTED NOT DETECTED Final    Comment: Performed at [facility], [address], [place], [place] [address]    Time coordinating discharge: 45 minutes  Signed: Markell Schrier  [facility] 11/01/2022, 11:02 AM

## 2022-11-01 NOTE — Progress Notes (Signed)
Attempted to call [facility] to give report was left on hold. Attempted to call a second time with no response. Chart will travel with patient.

## 2022-11-01 NOTE — TOC Transition Note (Signed)
Transition of Care [facility]) - CM/SW Discharge Note   Patient Details  Name: [name] MRN: [id number] Date of Birth: [birth year]  Transition of Care [facility] CM/SW Contact:  Amada Jupiter, LCSW Phone Number: 11/01/2022, 1:02 PM   Clinical Narrative:    Pt medically cleared for dc to [facility] today.  Pt and sister aware and agreeable.  PTAR called at 1:00 pm.  RN to call report to [phone].  No further TOC needs.   Final next level of care: Skilled Nursing Facility Barriers to Discharge: Barriers Resolved   Patient Goals and CMS Choice CMS Medicare.gov Compare Post Acute Care list provided to:: Patient Choice offered to / list presented to : Patient  Discharge Placement                Patient chooses bed at: [facility] Patient to be transferred to facility by: PTAR Name of family member notified: sister Patient and family notified of of transfer: 11/01/22  Discharge Plan and Services Additional resources added to the After Visit Summary for                                       Social Determinants of Health (SDOH) Interventions SDOH Screenings   Food Insecurity: No Food Insecurity (10/24/2022)  Housing: Low Risk  (10/24/2022)  Transportation Needs: No Transportation Needs (10/24/2022)  Utilities: Not At Risk (10/24/2022)  Depression (PHQ2-9): Low Risk  (09/18/2020)  Tobacco Use: Low Risk  (10/24/2022)     Readmission Risk Interventions    10/27/2022   12:30 PM 01/29/2022   10:17 AM 06/06/2020    2:30 PM  Readmission Risk Prevention Plan  Transportation Screening Complete Complete Complete  PCP or Specialist Appt within 3-5 Days Complete Complete   HRI or Home Care Consult Complete Complete   Social Work Consult for Recovery Care Planning/Counseling Complete Complete   Palliative Care Screening  Complete   Medication Review Oceanographer) Complete Complete   PCP or Specialist appointment within 3-5 days of discharge   Complete  SW  Recovery Care/Counseling Consult   Complete  Palliative Care Screening   Not Applicable

## 2022-11-23 ENCOUNTER — Ambulatory Visit: Payer: Medicare Other

## 2022-11-24 ENCOUNTER — Encounter: Payer: Self-pay | Admitting: Podiatry

## 2022-11-24 ENCOUNTER — Ambulatory Visit: Payer: Medicare Other | Admitting: Podiatry

## 2022-11-24 DIAGNOSIS — M79675 Pain in left toe(s): Secondary | ICD-10-CM

## 2022-11-24 DIAGNOSIS — M79674 Pain in right toe(s): Secondary | ICD-10-CM | POA: Diagnosis not present

## 2022-11-24 DIAGNOSIS — E1142 Type 2 diabetes mellitus with diabetic polyneuropathy: Secondary | ICD-10-CM | POA: Diagnosis not present

## 2022-11-24 DIAGNOSIS — L84 Corns and callosities: Secondary | ICD-10-CM

## 2022-11-24 DIAGNOSIS — B351 Tinea unguium: Secondary | ICD-10-CM | POA: Diagnosis not present

## 2022-11-30 ENCOUNTER — Telehealth: Payer: Self-pay | Admitting: Podiatry

## 2022-11-30 NOTE — Telephone Encounter (Signed)
Left message on vm for pt to call back to schedule appt  to pick up diabetic shoes

## 2022-11-30 NOTE — Progress Notes (Signed)
Subjective:  Patient ID: [name], female    DOB: 07/03/1929,  MRN: [id number]  [name] presents to clinic today for at risk foot care with history of diabetic neuropathy and preulcerative lesion(s) both feet and painful mycotic toenails that limit ambulation. Painful toenails interfere with ambulation. Aggravating factors include wearing enclosed shoe gear. Pain is relieved with periodic professional debridement. Painful preulcerative lesion(s) is/are aggravated when weightbearing with and without shoegear. Pain is relieved with periodic professional debridement. Patient states she was hospitalized for low sodium. She has been discharged and is back at [facility]. Chief Complaint  Patient presents with   Callouses    CALLUS BILAT CORN LEFT 3 TOE   Diabetes    DFC   New problem(s): None.   PCP is Verlon Au, MD.  Allergies  Allergen Reactions   Sherrie Mustache Hcl] Other (See Comments)    "extreme weakness, heaviness in legs & arms, swelling in legs/arms/face, swollen abdomen, pain in bladder, feet pain, soreness in chest"   Carbamazepine Other (See Comments)    Blood poisoning   Cholestyramine Itching, Nausea And Vomiting, Rash and Other (See Comments)    "itching rash on stomach, bloated, nausea, vomiting, sleeplessness, extreme pain in arms"  Patient is taking this in 2022.   Morphine Other (See Comments)    Feels morbid, weak, still in pain   Niacin Palpitations    Fast heart beat Other reaction(s): Unknown   Norvasc [Amlodipine Besylate] Other (See Comments)    "extreme fluid retention/pain)   Optivar [Azelastine Hcl] Photosensitivity   Repaglinide Hives   Sular [Nisoldipine Er] Other (See Comments)    "severe headaches, swelling eyes, hands, feet, shortness of breath, weak, flushed face, brain boiling, fluid retention, high blood sugar, nervous, heart fast beating"   Telmisartan Other (See Comments)    "headache, difficulty urinating,  high blood sugar, fluid retention"   Amoxicillin-Pot Clavulanate Rash    Tolerates Unasyn (>10 doses)   Cefdinir Swelling    Vaginal irritation, breathing,    Ciprofloxacin Hcl Hives   Clonidine Other (See Comments)    Dry mouth, fluid retention   Codeine Nausea And Vomiting   Ezetimibe Other (See Comments)    Made weak   Hydroxychloroquine Other (See Comments)    Low platlets   Naproxen Other (See Comments)    Shrinks bladder   Sulfa Antibiotics Rash   Ziac [Bisoprolol-Hydrochlorothiazide] Other (See Comments)    "stopped urination"   Amlodipine Besy-Benazepril Hcl Cough   Azelastine Other (See Comments)    Unknown reaction - Per MAR   Elavil [Amitriptyline] Other (See Comments)    Gave Pt nightmares   Empagliflozin Other (See Comments)    "Caused yeast infection, slowed my urine"   Hydralazine Other (See Comments)    "does not reduce high blood pressure, pain in arm, high pressure, felt like I was on verge of heart attack, really weak"   Iodine I 131 Tositumomab Other (See Comments)    Unknown reaction - Per MAR   Kenalog [Triamcinolone] Diarrhea    Per MAR   Keppra [Levetiracetam] Other (See Comments)    Shaking   Lamotrigine Itching and Other (See Comments)   Pregabalin Swelling and Other (See Comments)    Weight gain   Pseudoephedrine Other (See Comments)    Unknown reaction   Pseudoephedrine-Guaifenesin Er Other (See Comments)    Unknown reaction - Per MAR   Risedronate     Unknown reaction - Per [facility]  Ru-Hist D [Brompheniramine-Phenylephrine] Other (See Comments)    Unknown reaction - Per MAR   Sumatriptan Other (See Comments)   Topiramate Other (See Comments)    Dry eyes   Ace Inhibitors Other (See Comments)    unknown   Aspirin Other (See Comments)    Unknown reaction   Atacand [Candesartan] Other (See Comments)    Unknown reaction - MAR   Bextra [Valdecoxib] Other (See Comments)    Unknown reaction - Per MAR   Bisoprolol-Hydrochlorothiazide Other (See  Comments)    Unknown reaction - Per MAR   Cefadroxil Other (See Comments)    Unknown reaction   Celecoxib Rash   Hydrocodone Other (See Comments)    unknown   Hydrocodone-Acetaminophen Other (See Comments)    unknown   Iodinated Contrast Media Rash and Other (See Comments)    "All over" rash   Meloxicam Other (See Comments)    unknown   Methylprednisolone Sodium Succinate Other (See Comments)    unknown   Nabumetone Other (See Comments)    Unknown reaction   Penicillins Other (See Comments)    unknown   Pseudoephedrine-Guaifenesin Other (See Comments)    unknown   Rofecoxib Other (See Comments)    Unknown reaction - MAR Other reaction(s): Unknown   Ru-Tuss [Chlorphen-Pse-Atrop-Hyos-Scop] Other (See Comments)    unknown   Sulfonamide Derivatives Other (See Comments)    unknown   Sulphur [Elemental Sulfur] Other (See Comments)    unknown   Telithromycin Other (See Comments)    unknown   Terfenadine Other (See Comments)    Unknown reaction - Per MAR   Trandolapril-Verapamil Hcl Er Other (See Comments)    Headache, difficulty urinating, high blood sugar, fluid retention  Pt is taking Tarka (trandolapril-verapamil) currently, but requests the medication stay in her allergy list   Valium [Diazepam] Other (See Comments)    Makes her mean and hyper    Review of Systems: Negative except as noted in the HPI.  Objective: No changes noted in today's physical examination. There were no vitals filed for this visit. [name] is a pleasant 89 y.o. female WD, WN in NAD. AAO x 3.  Vascular Examination: CFT <3 seconds b/l. DP/PT pulses faintly palpable b/l. Skin temperature gradient warm to warm b/l. No pain with calf compression. No ischemia or gangrene. No cyanosis or clubbing noted b/l. No edema noted b/l LE.   Neurological Examination: Protective sensation diminished with 10g monofilament b/l.  Dermatological Examination: Pedal skin warm and supple b/l.   No open wounds. No  interdigital macerations.  Toenails 1-5 b/l thick, discolored, elongated with subungual debris and pain on dorsal palpation.    Preulcerative lesion noted submet head 1 b/l left .right and L 2nd toe. There is visible subdermal hemorrhage. There is no surrounding erythema, no edema, no drainage, no odor, no fluctuance.  Musculoskeletal Examination: Normal muscle strength 5/5 to all lower extremity muscle groups bilaterally. No pain, crepitus or joint limitation noted with ROM b/l LE. Hammertoe deformity noted 1-5 b/l.Marland Kitchen Patient ambulates with rollator assistance.  Radiographs: None  Last A1c:      Latest Ref Rng & Units 10/24/2022    4:48 PM 01/24/2022    5:00 AM  Hemoglobin A1C  Hemoglobin-A1c 4.8 - 5.6 % 6.9  6.4    Assessment/Plan: 1. Pain due to onychomycosis of toenails of both feet   2. Pre-ulcerative calluses   3. Diabetic peripheral neuropathy associated with type 2 diabetes mellitus (HCC)     -Patient's  family member present. All questions/concerns addressed on today's visit. -Consent given for treatment as described below: -Examined patient. -Facility to continue fall precautions and pressure precautions. -Mycotic toenails 1-5 bilaterally were debrided in length and girth with sterile nail nippers and dremel without incident. -Preulcerative lesion pared left second digit and submet head 1 b/l utilizing sterile scalpel blade. Total number pared=3. -Order written for [employer] staff to apply antibiotic ointment to left 2nd toe once daily for one week. -Patient/POA to call should there be question/concern in the interim.   Return in about 9 weeks (around 01/26/2023).  Freddie Breech, DPM

## 2022-12-07 ENCOUNTER — Ambulatory Visit (INDEPENDENT_AMBULATORY_CARE_PROVIDER_SITE_OTHER): Payer: Medicare Other

## 2022-12-07 DIAGNOSIS — E1142 Type 2 diabetes mellitus with diabetic polyneuropathy: Secondary | ICD-10-CM

## 2022-12-07 DIAGNOSIS — L84 Corns and callosities: Secondary | ICD-10-CM

## 2022-12-07 DIAGNOSIS — M2041 Other hammer toe(s) (acquired), right foot: Secondary | ICD-10-CM

## 2022-12-07 DIAGNOSIS — M2042 Other hammer toe(s) (acquired), left foot: Secondary | ICD-10-CM

## 2022-12-07 NOTE — Progress Notes (Signed)
Patient presents today to pick up diabetic shoes and insoles.  Patient was dispensed 1 pair of diabetic shoes and 3 pairs of foam casted diabetic insoles. Fit was satisfactory. Instructions for break-in and wear was reviewed and a copy was given to the patient.   Re-appointment for regularly scheduled diabetic foot care visits or if they should experience any trouble with the shoes or insoles.  Shoes were a little snug patient still wants to try for a week at home will call back for re-fitting if needed -Addison Bailey CPed, CFo, CFm

## 2022-12-08 ENCOUNTER — Other Ambulatory Visit: Payer: Medicare Other

## 2022-12-09 ENCOUNTER — Inpatient Hospital Stay (HOSPITAL_COMMUNITY)
Admission: EM | Admit: 2022-12-09 | Discharge: 2022-12-15 | DRG: 522 | Disposition: A | Discharge status: Skilled Nursing Facility | Payer: Medicare Other | Attending: Internal Medicine | Admitting: Internal Medicine

## 2022-12-09 ENCOUNTER — Emergency Department (HOSPITAL_COMMUNITY): Payer: Medicare Other

## 2022-12-09 ENCOUNTER — Other Ambulatory Visit: Payer: Self-pay

## 2022-12-09 DIAGNOSIS — Z880 Allergy status to penicillin: Secondary | ICD-10-CM

## 2022-12-09 DIAGNOSIS — S72012A Unspecified intracapsular fracture of left femur, initial encounter for closed fracture: Principal | ICD-10-CM | POA: Diagnosis present

## 2022-12-09 DIAGNOSIS — Z961 Presence of intraocular lens: Secondary | ICD-10-CM | POA: Diagnosis present

## 2022-12-09 DIAGNOSIS — J45909 Unspecified asthma, uncomplicated: Secondary | ICD-10-CM | POA: Diagnosis not present

## 2022-12-09 DIAGNOSIS — Z90710 Acquired absence of both cervix and uterus: Secondary | ICD-10-CM

## 2022-12-09 DIAGNOSIS — D62 Acute posthemorrhagic anemia: Secondary | ICD-10-CM | POA: Diagnosis not present

## 2022-12-09 DIAGNOSIS — Z85038 Personal history of other malignant neoplasm of large intestine: Secondary | ICD-10-CM

## 2022-12-09 DIAGNOSIS — Z853 Personal history of malignant neoplasm of breast: Secondary | ICD-10-CM | POA: Diagnosis not present

## 2022-12-09 DIAGNOSIS — G250 Essential tremor: Secondary | ICD-10-CM | POA: Diagnosis present

## 2022-12-09 DIAGNOSIS — Z7984 Long term (current) use of oral hypoglycemic drugs: Secondary | ICD-10-CM

## 2022-12-09 DIAGNOSIS — W1830XA Fall on same level, unspecified, initial encounter: Secondary | ICD-10-CM | POA: Diagnosis present

## 2022-12-09 DIAGNOSIS — Z882 Allergy status to sulfonamides status: Secondary | ICD-10-CM

## 2022-12-09 DIAGNOSIS — Z923 Personal history of irradiation: Secondary | ICD-10-CM | POA: Diagnosis not present

## 2022-12-09 DIAGNOSIS — E782 Mixed hyperlipidemia: Secondary | ICD-10-CM | POA: Diagnosis present

## 2022-12-09 DIAGNOSIS — S72002A Fracture of unspecified part of neck of left femur, initial encounter for closed fracture: Secondary | ICD-10-CM | POA: Diagnosis not present

## 2022-12-09 DIAGNOSIS — M8588 Other specified disorders of bone density and structure, other site: Secondary | ICD-10-CM | POA: Diagnosis present

## 2022-12-09 DIAGNOSIS — Z79899 Other long term (current) drug therapy: Secondary | ICD-10-CM

## 2022-12-09 DIAGNOSIS — E1165 Type 2 diabetes mellitus with hyperglycemia: Secondary | ICD-10-CM | POA: Diagnosis present

## 2022-12-09 DIAGNOSIS — Z9049 Acquired absence of other specified parts of digestive tract: Secondary | ICD-10-CM

## 2022-12-09 DIAGNOSIS — E861 Hypovolemia: Secondary | ICD-10-CM | POA: Diagnosis present

## 2022-12-09 DIAGNOSIS — Z8249 Family history of ischemic heart disease and other diseases of the circulatory system: Secondary | ICD-10-CM

## 2022-12-09 DIAGNOSIS — M25752 Osteophyte, left hip: Secondary | ICD-10-CM | POA: Diagnosis present

## 2022-12-09 DIAGNOSIS — Z886 Allergy status to analgesic agent status: Secondary | ICD-10-CM

## 2022-12-09 DIAGNOSIS — G40909 Epilepsy, unspecified, not intractable, without status epilepticus: Secondary | ICD-10-CM | POA: Diagnosis present

## 2022-12-09 DIAGNOSIS — Z833 Family history of diabetes mellitus: Secondary | ICD-10-CM

## 2022-12-09 DIAGNOSIS — R9431 Abnormal electrocardiogram [ECG] [EKG]: Secondary | ICD-10-CM | POA: Diagnosis not present

## 2022-12-09 DIAGNOSIS — Z8042 Family history of malignant neoplasm of prostate: Secondary | ICD-10-CM

## 2022-12-09 DIAGNOSIS — E039 Hypothyroidism, unspecified: Secondary | ICD-10-CM | POA: Diagnosis present

## 2022-12-09 DIAGNOSIS — Z794 Long term (current) use of insulin: Secondary | ICD-10-CM

## 2022-12-09 DIAGNOSIS — K219 Gastro-esophageal reflux disease without esophagitis: Secondary | ICD-10-CM | POA: Diagnosis present

## 2022-12-09 DIAGNOSIS — Z79890 Hormone replacement therapy (postmenopausal): Secondary | ICD-10-CM | POA: Diagnosis not present

## 2022-12-09 DIAGNOSIS — Z85828 Personal history of other malignant neoplasm of skin: Secondary | ICD-10-CM

## 2022-12-09 DIAGNOSIS — K5900 Constipation, unspecified: Secondary | ICD-10-CM | POA: Diagnosis present

## 2022-12-09 DIAGNOSIS — Z91041 Radiographic dye allergy status: Secondary | ICD-10-CM

## 2022-12-09 DIAGNOSIS — Z8349 Family history of other endocrine, nutritional and metabolic diseases: Secondary | ICD-10-CM

## 2022-12-09 DIAGNOSIS — Z86011 Personal history of benign neoplasm of the brain: Secondary | ICD-10-CM

## 2022-12-09 DIAGNOSIS — Z9842 Cataract extraction status, left eye: Secondary | ICD-10-CM

## 2022-12-09 DIAGNOSIS — D649 Anemia, unspecified: Secondary | ICD-10-CM | POA: Diagnosis present

## 2022-12-09 DIAGNOSIS — Z800 Family history of malignant neoplasm of digestive organs: Secondary | ICD-10-CM

## 2022-12-09 DIAGNOSIS — I10 Essential (primary) hypertension: Secondary | ICD-10-CM | POA: Diagnosis present

## 2022-12-09 DIAGNOSIS — E871 Hypo-osmolality and hyponatremia: Secondary | ICD-10-CM | POA: Diagnosis present

## 2022-12-09 DIAGNOSIS — Z881 Allergy status to other antibiotic agents status: Secondary | ICD-10-CM | POA: Diagnosis not present

## 2022-12-09 DIAGNOSIS — Z888 Allergy status to other drugs, medicaments and biological substances status: Secondary | ICD-10-CM

## 2022-12-09 DIAGNOSIS — Y92009 Unspecified place in unspecified non-institutional (private) residence as the place of occurrence of the external cause: Secondary | ICD-10-CM | POA: Diagnosis not present

## 2022-12-09 DIAGNOSIS — Z885 Allergy status to narcotic agent status: Secondary | ICD-10-CM

## 2022-12-09 DIAGNOSIS — J4489 Other specified chronic obstructive pulmonary disease: Secondary | ICD-10-CM | POA: Diagnosis present

## 2022-12-09 DIAGNOSIS — K529 Noninfective gastroenteritis and colitis, unspecified: Secondary | ICD-10-CM | POA: Diagnosis present

## 2022-12-09 DIAGNOSIS — Z9841 Cataract extraction status, right eye: Secondary | ICD-10-CM

## 2022-12-09 DIAGNOSIS — Z85820 Personal history of malignant melanoma of skin: Secondary | ICD-10-CM

## 2022-12-09 DIAGNOSIS — Z96641 Presence of right artificial hip joint: Secondary | ICD-10-CM | POA: Diagnosis present

## 2022-12-09 DIAGNOSIS — Z8262 Family history of osteoporosis: Secondary | ICD-10-CM

## 2022-12-09 LAB — GLUCOSE, CAPILLARY: Glucose-Capillary: 219 mg/dL — ABNORMAL HIGH (ref 70–99)

## 2022-12-09 LAB — CBC WITH DIFFERENTIAL/PLATELET
Abs Immature Granulocytes: 0.05 10*3/uL (ref 0.00–0.07)
Basophils Absolute: 0 10*3/uL (ref 0.0–0.1)
Basophils Relative: 0 %
Eosinophils Absolute: 0 10*3/uL (ref 0.0–0.5)
Eosinophils Relative: 1 %
HCT: 30.7 % — ABNORMAL LOW (ref 36.0–46.0)
Hemoglobin: 9.7 g/dL — ABNORMAL LOW (ref 12.0–15.0)
Immature Granulocytes: 1 %
Lymphocytes Relative: 14 %
Lymphs Abs: 1.1 10*3/uL (ref 0.7–4.0)
MCH: 29.5 pg (ref 26.0–34.0)
MCHC: 31.6 g/dL (ref 30.0–36.0)
MCV: 93.3 fL (ref 80.0–100.0)
Monocytes Absolute: 0.5 10*3/uL (ref 0.1–1.0)
Monocytes Relative: 6 %
Neutro Abs: 6.6 10*3/uL (ref 1.7–7.7)
Neutrophils Relative %: 78 %
Platelets: 293 10*3/uL (ref 150–400)
RBC: 3.29 MIL/uL — ABNORMAL LOW (ref 3.87–5.11)
RDW: 13.9 % (ref 11.5–15.5)
WBC: 8.3 10*3/uL (ref 4.0–10.5)
nRBC: 0 % (ref 0.0–0.2)

## 2022-12-09 LAB — BASIC METABOLIC PANEL
Anion gap: 10 (ref 5–15)
BUN: 13 mg/dL (ref 8–23)
CO2: 21 mmol/L — ABNORMAL LOW (ref 22–32)
Calcium: 8.5 mg/dL — ABNORMAL LOW (ref 8.9–10.3)
Chloride: 94 mmol/L — ABNORMAL LOW (ref 98–111)
Creatinine, Ser: 0.6 mg/dL (ref 0.44–1.00)
GFR, Estimated: >60 mL/min (ref >60)
Glucose, Bld: 180 mg/dL — ABNORMAL HIGH (ref 70–99)
Potassium: 4.4 mmol/L (ref 3.5–5.1)
Sodium: 125 mmol/L — ABNORMAL LOW (ref 135–145)

## 2022-12-09 LAB — PHENYTOIN LEVEL, TOTAL: Phenytoin Lvl: 6.2 ug/mL — ABNORMAL LOW (ref 10.0–20.0)

## 2022-12-09 LAB — PROTIME-INR
INR: 1 (ref 0.8–1.2)
Prothrombin Time: 13.6 seconds (ref 11.4–15.2)

## 2022-12-09 LAB — TYPE AND SCREEN
ABO/RH(D): A POS
Antibody Screen: NEGATIVE

## 2022-12-09 MED ORDER — SODIUM CHLORIDE 1 G PO TABS
2.0000 g | ORAL_TABLET | Freq: Two times a day (BID) | ORAL | Status: DC
  Administered 2022-12-09 – 2022-12-15 (×13): 2 g via ORAL
  Filled 2022-12-09 (×15): qty 2

## 2022-12-09 MED ORDER — INSULIN GLARGINE-YFGN 100 UNIT/ML ~~LOC~~ SOLN
10.0000 [IU] | Freq: Every day | SUBCUTANEOUS | Status: DC
  Administered 2022-12-09 – 2022-12-15 (×7): 10 [IU] via SUBCUTANEOUS
  Filled 2022-12-09 (×7): qty 0.1

## 2022-12-09 MED ORDER — PHENYTOIN SODIUM EXTENDED 100 MG PO CAPS
300.0000 mg | ORAL_CAPSULE | Freq: Every day | ORAL | Status: DC
  Administered 2022-12-09 – 2022-12-15 (×7): 300 mg via ORAL
  Filled 2022-12-09 (×7): qty 3

## 2022-12-09 MED ORDER — INSULIN ASPART 100 UNIT/ML IJ SOLN
0.0000 [IU] | Freq: Three times a day (TID) | INTRAMUSCULAR | Status: DC
  Administered 2022-12-10: 1 [IU] via SUBCUTANEOUS
  Administered 2022-12-12: 2 [IU] via SUBCUTANEOUS
  Administered 2022-12-12: 3 [IU] via SUBCUTANEOUS
  Administered 2022-12-13: 2 [IU] via SUBCUTANEOUS
  Filled 2022-12-09: qty 0.09

## 2022-12-09 MED ORDER — LEVOTHYROXINE SODIUM 50 MCG PO TABS
75.0000 ug | ORAL_TABLET | Freq: Every day | ORAL | Status: DC
  Administered 2022-12-10 – 2022-12-15 (×6): 75 ug via ORAL
  Filled 2022-12-09 (×6): qty 1

## 2022-12-09 MED ORDER — CHOLESTYRAMINE LIGHT 4 G PO PACK
4.0000 g | PACK | Freq: Two times a day (BID) | ORAL | Status: DC
  Administered 2022-12-10 – 2022-12-15 (×9): 4 g via ORAL
  Filled 2022-12-09 (×9): qty 1

## 2022-12-09 MED ORDER — PANTOPRAZOLE SODIUM 40 MG PO TBEC
40.0000 mg | DELAYED_RELEASE_TABLET | Freq: Every day | ORAL | Status: DC
  Administered 2022-12-10 – 2022-12-15 (×6): 40 mg via ORAL
  Filled 2022-12-09 (×6): qty 1

## 2022-12-09 MED ORDER — PRIMIDONE 50 MG PO TABS
150.0000 mg | ORAL_TABLET | Freq: Every day | ORAL | Status: DC
  Administered 2022-12-09 – 2022-12-15 (×7): 150 mg via ORAL
  Filled 2022-12-09 (×7): qty 3

## 2022-12-09 MED ORDER — ONDANSETRON HCL 4 MG PO TABS: 4.0000 mg | ORAL_TABLET | Freq: Four times a day (QID) | ORAL | Status: DC | PRN

## 2022-12-09 MED ORDER — PHENYTOIN 50 MG PO CHEW
50.0000 mg | CHEWABLE_TABLET | Freq: Every day | ORAL | Status: DC
  Administered 2022-12-11 – 2022-12-15 (×5): 50 mg via ORAL
  Filled 2022-12-09 (×6): qty 1

## 2022-12-09 MED ORDER — OXYCODONE HCL 5 MG PO TABS
5.0000 mg | ORAL_TABLET | ORAL | Status: DC | PRN
  Administered 2022-12-09 – 2022-12-12 (×11): 5 mg via ORAL
  Filled 2022-12-09 (×11): qty 1

## 2022-12-09 MED ORDER — FENTANYL CITRATE PF 50 MCG/ML IJ SOSY
100.0000 ug | PREFILLED_SYRINGE | Freq: Once | INTRAMUSCULAR | Status: AC
  Administered 2022-12-09: 100 ug via INTRAVENOUS
  Filled 2022-12-09: qty 2

## 2022-12-09 MED ORDER — ONDANSETRON HCL 4 MG/2ML IJ SOLN: 4.0000 mg | Freq: Four times a day (QID) | INTRAMUSCULAR | Status: DC | PRN

## 2022-12-09 MED ORDER — PHENYTOIN SODIUM EXTENDED 100 MG PO CAPS: 300.0000 mg | ORAL_CAPSULE | Freq: Every day | ORAL | Status: DC

## 2022-12-09 MED ORDER — VERAPAMIL HCL ER 180 MG PO TBCR
180.0000 mg | EXTENDED_RELEASE_TABLET | Freq: Every day | ORAL | Status: DC
  Administered 2022-12-10 – 2022-12-15 (×6): 180 mg via ORAL
  Filled 2022-12-09 (×6): qty 1

## 2022-12-09 MED ORDER — CYANOCOBALAMIN 1000 MCG/ML IJ SOLN: 1000.0000 ug | INTRAMUSCULAR | Status: DC

## 2022-12-09 MED ORDER — HYDROMORPHONE HCL 1 MG/ML IJ SOLN
0.5000 mg | INTRAMUSCULAR | Status: AC | PRN
  Administered 2022-12-09 – 2022-12-10 (×3): 0.5 mg via INTRAVENOUS
  Filled 2022-12-09 (×3): qty 0.5

## 2022-12-09 MED ORDER — SODIUM CHLORIDE 0.9 % IV SOLN: INTRAVENOUS | Status: AC

## 2022-12-09 MED ORDER — FENTANYL CITRATE PF 50 MCG/ML IJ SOSY
50.0000 ug | PREFILLED_SYRINGE | INTRAMUSCULAR | Status: DC | PRN
  Administered 2022-12-09: 50 ug via INTRAVENOUS
  Filled 2022-12-09: qty 1

## 2022-12-09 MED ORDER — VERAPAMIL HCL ER 200 MG PO CP24: 200.0000 mg | ORAL_CAPSULE | Freq: Every day | ORAL | Status: DC

## 2022-12-09 MED ORDER — POLYETHYLENE GLYCOL 3350 17 G PO PACK: 17.0000 g | PACK | Freq: Every day | ORAL | Status: DC | PRN

## 2022-12-09 MED ORDER — FENTANYL CITRATE PF 50 MCG/ML IJ SOSY
50.0000 ug | PREFILLED_SYRINGE | Freq: Once | INTRAMUSCULAR | Status: AC
  Administered 2022-12-09: 50 ug via INTRAVENOUS
  Filled 2022-12-09: qty 1

## 2022-12-09 MED ORDER — PRIMIDONE 250 MG PO TABS
250.0000 mg | ORAL_TABLET | Freq: Every day | ORAL | Status: DC
  Administered 2022-12-10 – 2022-12-15 (×6): 250 mg via ORAL
  Filled 2022-12-09 (×6): qty 1

## 2022-12-09 MED ORDER — SODIUM CHLORIDE 0.9 % IV SOLN: INTRAVENOUS | Status: DC

## 2022-12-09 NOTE — ED Triage Notes (Signed)
Patient brought in from home by EMS after a fall. States she was trying to put her shirt on lost her balance and fell. She denies LOC and denies thinners. She does report headache and L leg pain. Per EMS patient unable to straighten L leg. EMS started 20g in R wrist and administered of fentanyl. 180/90 74 98% RA

## 2022-12-09 NOTE — ED Notes (Signed)
Patient transported to X-ray 

## 2022-12-09 NOTE — H&P (Signed)
History and Physical    Patient: [name] UXL:[id number] DOB: [birth year] DOA: 12/09/2022 DOS: the patient was seen and examined on 12/09/2022 PCP: Verlon Au, MD  Patient coming from: Home  Chief Complaint:  Chief Complaint  Patient presents with   Fall   HPI: [name] is a 89 y.o. female with medical history significant of unspecified anemia, anxiety, asthma/chronic bronchitis, chronic cough, right breast cancer, history of radiation therapy, carotid artery disease, bilateral carpal tunnel syndrome, history of chronic diarrhea, chronic facial pain, chronic foot pain, history of colon cancer, seizure disorder, type 2 diabetes, hyperlipidemia, gait abnormality, GERD, bilateral hammertoe, colon polyps, history of meningioma, hypertension, hypokalemia, hyponatremia, hypothyroidism, IBS, melanoma, metatarsal bone fracture, multiple drug allergies, nausea and vomiting, obesity, osteoarthritis, osteoporosis, palpitations, seasonal allergies, unspecified skin cancer, history of syncope, essential tremor, vitamin B12 deficiency, vitamin D deficiency who was brought to the emergency department via EMS after she had a fall at home.  Per patient, she did not get any prodromal symptoms and stated that the fall was accidental.  She denied fever, chills, rhinorrhea, sore throat, wheezing or hemoptysis.  She has been feeling mildly lightheaded at times, but no chest pain, palpitations, diaphoresis, PND, orthopnea or pitting edema of the lower extremities.  No abdominal pain, nausea, emesis, diarrhea, constipation, melena or hematochezia.  No flank pain, dysuria, frequency or hematuria.  No polyuria, polydipsia, polyphagia or blurred vision.  She has not been taking the sodium chloride tablets.  Lab work: CBC showed a white count of 8.3, hemoglobin 9.7 g/dL and platelets 536.  Normal PT and INR.  BMP shows a sodium 125, potassium 4.4, chloride 94 and CO2 21 mmol/L with a normal anion gap.  Glucose  monitoring 80 and calcium 8.5 mg/dL.  Normal renal function.  Imaging: Portable 1 view chest radiograph showing no acute cardiopulmonary disease.  Normal cardiopericardial silhouette.  Left hip x-ray showed minimally displaced subcapital femoral neck fracture of the left hip.  Osteopenia.  Right hip hemiarthroplasty.   ED course: Initial vital signs were temperature 97.4 F, pulse 80, respiration 20, BP 146/96 mmHg and O2 sat 100% on room air.  The patient received fentanyl 100 mcg IVP x 1.  I added another 50 mcg of fentanyl while the patient was seen in the ED.  Review of Systems: As mentioned in the history of present illness. All other systems reviewed and are negative. Past Medical History:  Diagnosis Date   Anemia    Anxiety    Asthma    Breast cancer (HCC) 2014   right breast   Cancer of right breast (HCC) 12/26/2012   right breast 12:00 o'clock, DCIS   Carotid artery disease (HCC)    Carpal tunnel syndrome, bilateral    Chronic bronchitis (HCC)    Chronic cough    Chronic diarrhea    3 years   Chronic facial pain    Chronic foot pain    Colon cancer (HCC) dx'd 11/2019   Complication of anesthesia    Sore jaw; could not chew or move mouth, prolonged sedation   Convulsions/seizures (HCC) 10/16/2014   Diabetes mellitus    type 2 niddm x 20 years   Dyslipidemia    Ejection fraction    Gait abnormality 12/04/2019   GERD (gastroesophageal reflux disease)    Hammer toe    bilateral   History of colonic polyps    History of meningioma    HTN (hypertension)    Hx of radiation therapy 03/07/13-  03/29/13   right breast 4250 cGy 17 sessions   Hyperlipidemia    Hypokalemia    Hyponatremia    Hypothyroidism    IBS (irritable bowel syndrome)    Melanoma (HCC)    Metatarsal bone fracture 2014   Multiple drug allergies    Nausea & vomiting 03/12/2020   Nontoxic thyroid nodule    Obesity    Osteoarthritis    Osteoporosis    Palpitations    Personal history of radiation  therapy 2014   Seasonal allergies    Skin cancer    Syncope    Tremor, essential 08/18/2016   Vitamin B12 deficiency    Vitamin D deficiency    Past Surgical History:  Procedure Laterality Date   ABDOMINAL HYSTERECTOMY     BIOPSY  11/07/2019   Procedure: BIOPSY;  Surgeon: Bernette Redbird, MD;  Location: [facility];  Service: Endoscopy;;   BRAIN SURGERY     BREAST BIOPSY Right 01/24/2013   Procedure: RE-EXCICION OF BREAST CANCER, ANTERIOR MARGINS;  Surgeon: Mariella Saa, MD;  Location: [facility];  Service: General;  Laterality: Right;   BREAST LUMPECTOMY Right 2014   BREAST LUMPECTOMY WITH NEEDLE LOCALIZATION Right 01/17/2013   Procedure: BREAST LUMPECTOMY WITH NEEDLE LOCALIZATION;  Surgeon: Mariella Saa, MD;  Location: [facility];  Service: General;  Laterality: Right;   BUNIONECTOMY Bilateral    CATARACT EXTRACTION W/ INTRAOCULAR LENS IMPLANT Right    CHOLECYSTECTOMY     COLONOSCOPY     COLONOSCOPY WITH PROPOFOL N/A 11/07/2019   Procedure: COLONOSCOPY WITH PROPOFOL;  Surgeon: Bernette Redbird, MD;  Location: [facility];  Service: Endoscopy;  Laterality: N/A;   CRANIOTOMY Right 10/18/2013   Procedure: CRANIOTOMY TUMOR EXCISION;  Surgeon: Karn Cassis, MD;  Location: [facility];  Service: Neurosurgery;  Laterality: Right;   ESOPHAGOGASTRODUODENOSCOPY (EGD) WITH PROPOFOL N/A 11/07/2019   Procedure: ESOPHAGOGASTRODUODENOSCOPY (EGD) WITH PROPOFOL;  Surgeon: Bernette Redbird, MD;  Location: [facility];  Service: Endoscopy;  Laterality: N/A;   EYE SURGERY     HERNIA REPAIR     IR THORACENTESIS ASP PLEURAL SPACE W/IMG GUIDE  04/01/2020   KNEE ARTHROSCOPY Bilateral    LAPAROSCOPIC RIGHT HEMI COLECTOMY Right 03/06/2020   Procedure: LAPAROSCOPIC RIGHT HEMI COLECTOMY WITH TAP BLOCK AND LYSIS OF ADHESIONS;  Surgeon: Andria Meuse, MD;  Location: [facility];  Service: General;  Laterality: Right;   POLYPECTOMY     small adenomatous   TOTAL HIP ARTHROPLASTY Right 06/05/2020    Procedure: ARTHROPLASTY  ANTERIOR APPROACH. RIGHT HIP;  Surgeon: Samson Frederic, MD;  Location: [facility];  Service: Orthopedics;  Laterality: Right;   ULNAR TUNNEL RELEASE     Social History:  reports that she has never smoked. She has never used smokeless tobacco. She reports that she does not drink alcohol and does not use drugs.  Allergies  Allergen Reactions   Bystolic [Nebivolol Hcl] Other (See Comments)    "extreme weakness, heaviness in legs & arms, swelling in legs/arms/face, swollen abdomen, pain in bladder, feet pain, soreness in chest"   Carbamazepine Other (See Comments)    Blood poisoning   Cholestyramine Itching, Nausea And Vomiting, Rash and Other (See Comments)    "itching rash on stomach, bloated, nausea, vomiting, sleeplessness, extreme pain in arms"  Patient is taking this in 2022.   Morphine Other (See Comments)    Feels morbid, weak, still in pain   Niacin Palpitations    Fast heart beat Other reaction(s): Unknown   Norvasc [Amlodipine Besylate] Other (  See Comments)    "extreme fluid retention/pain)   Aliene Altes Hcl] Photosensitivity   Repaglinide Hives   Sular [Nisoldipine Er] Other (See Comments)    "severe headaches, swelling eyes, hands, feet, shortness of breath, weak, flushed face, brain boiling, fluid retention, high blood sugar, nervous, heart fast beating"   Telmisartan Other (See Comments)    "headache, difficulty urinating, high blood sugar, fluid retention"   Amoxicillin-Pot Clavulanate Rash    Tolerates Unasyn (>10 doses)   Cefdinir Swelling    Vaginal irritation, breathing,    Ciprofloxacin Hcl Hives   Clonidine Other (See Comments)    Dry mouth, fluid retention   Codeine Nausea And Vomiting   Ezetimibe Other (See Comments)    Made weak   Hydroxychloroquine Other (See Comments)    Low platlets   Naproxen Other (See Comments)    Shrinks bladder   Sulfa Antibiotics Rash   Ziac [Bisoprolol-Hydrochlorothiazide] Other (See Comments)     "stopped urination"   Amlodipine Besy-Benazepril Hcl Cough   Azelastine Other (See Comments)    Unknown reaction - Per MAR   Elavil [Amitriptyline] Other (See Comments)    Gave Pt nightmares   Empagliflozin Other (See Comments)    "Caused yeast infection, slowed my urine"   Hydralazine Other (See Comments)    "does not reduce high blood pressure, pain in arm, high pressure, felt like I was on verge of heart attack, really weak"   Iodine I 131 Tositumomab Other (See Comments)    Unknown reaction - Per MAR   Kenalog [Triamcinolone] Diarrhea    Per MAR   Keppra [Levetiracetam] Other (See Comments)    Shaking   Lamotrigine Itching and Other (See Comments)   Pregabalin Swelling and Other (See Comments)    Weight gain   Pseudoephedrine Other (See Comments)    Unknown reaction   Pseudoephedrine-Guaifenesin Er Other (See Comments)    Unknown reaction - Per MAR   Risedronate     Unknown reaction - Per MAR   Ru-Hist D [Brompheniramine-Phenylephrine] Other (See Comments)    Unknown reaction - Per MAR   Sumatriptan Other (See Comments)   Topiramate Other (See Comments)    Dry eyes   Ace Inhibitors Other (See Comments)    unknown   Aspirin Other (See Comments)    Unknown reaction   Atacand [Candesartan] Other (See Comments)    Unknown reaction - MAR   Bextra [Valdecoxib] Other (See Comments)    Unknown reaction - Per MAR   Bisoprolol-Hydrochlorothiazide Other (See Comments)    Unknown reaction - Per MAR   Cefadroxil Other (See Comments)    Unknown reaction   Celecoxib Rash   Hydrocodone Other (See Comments)    unknown   Hydrocodone-Acetaminophen Other (See Comments)    unknown   Iodinated Contrast Media Rash and Other (See Comments)    "All over" rash   Meloxicam Other (See Comments)    unknown   Methylprednisolone Sodium Succinate Other (See Comments)    unknown   Nabumetone Other (See Comments)    Unknown reaction   Penicillins Other (See Comments)    unknown    Pseudoephedrine-Guaifenesin Other (See Comments)    unknown   Rofecoxib Other (See Comments)    Unknown reaction - MAR Other reaction(s): Unknown   Ru-Tuss [Chlorphen-Pse-Atrop-Hyos-Scop] Other (See Comments)    unknown   Sulfonamide Derivatives Other (See Comments)    unknown   Sulphur [Elemental Sulfur] Other (See Comments)    unknown   Telithromycin Other (  See Comments)    unknown   Terfenadine Other (See Comments)    Unknown reaction - Per MAR   Trandolapril-Verapamil Hcl Er Other (See Comments)    Headache, difficulty urinating, high blood sugar, fluid retention  Pt is taking Tarka (trandolapril-verapamil) currently, but requests the medication stay in her allergy list   Valium [Diazepam] Other (See Comments)    Makes her mean and hyper    Family History  Problem Relation Age of Onset   Heart disease Mother    Osteoporosis Mother    Diabetes Father    Pancreatic cancer Father    Bone cancer Sister    Rectal cancer Sister    Thyroid disease Sister        benign goiter resected   Prostate cancer Brother    Colon cancer Brother    Colon cancer Other     Prior to Admission medications   Medication Sig Start Date End Date Taking? Authorizing Provider  acetaminophen (TYLENOL) 500 MG tablet Take 2 tablets (1,000 mg total) by mouth 3 (three) times daily. Patient taking differently: Take 1,000 mg by mouth every 8 (eight) hours as needed for moderate pain. 08/21/20   Joseph Art, DO  Biotin 5000 MCG CAPS Take 5,000 mcg by mouth daily.    [provider]  Blood Glucose Monitoring Suppl (PRODIGY VOICE BLOOD GLUCOSE) w/Device KIT Use to check blood sugar 1 time per day. Patient not taking: Reported on 09/21/2022 10/18/15   Romero Belling, MD  capsicum (ZOSTRIX) 0.075 % topical cream Apply 1 Application topically 4 (four) times daily as needed (pain). 08/04/21   [provider]  Cholecalciferol (VITAMIN D-3) 125 MCG (5000 UT) TABS Take 5,000 Units by mouth daily.     [provider]  cholestyramine (QUESTRAN) 4 g packet Take 4 g by mouth 2 (two) times daily.    [provider]  cholestyramine light (PREVALITE) 4 g packet Take 1 packet (4 g total) by mouth 2 (two) times daily. 11/01/22   Dahal, Melina Schools, MD  cyanocobalamin (,VITAMIN B-12,) 1000 MCG/ML injection Inject 1,000 mcg into the muscle every 21 ( twenty-one) days.    [provider]  diclofenac Sodium (VOLTAREN) 1 % GEL Apply 2 g topically 4 (four) times daily as needed (for knee and back pain). 06/01/19   [provider]  diphenhydrAMINE (BENADRYL) 25 mg capsule Take 25 mg by mouth every 8 (eight) hours as needed for allergies or sleep.    [provider]  glucose blood (GLUCOSE METER TEST) test strip See admin instructions.    [provider]  Glucose Blood (PRODIGY VOICE BLOOD GLUCOSE VI)     [provider]  insulin aspart (NOVOLOG) 100 UNIT/ML FlexPen Inject 2-12 Units into the skin See admin instructions. Injects 2-10 units of insulin under the skin 3 times a day per sliding scale: CBG 0-150: 2 units; CBG 151-200: 4 units; CBG 201-250: 6 units; CBG 251-300: 8 units; CBG 301-350: 10 units; CBG 351-400: 12 units; CBG >400: 12 units and notify the MD/NP Patient taking differently: Inject 2-12 Units into the skin See admin instructions. Inject 2-10 units into the skin 3 times a day per sliding scale: CBG 0-150: 2 units; CBG 151-200: 4 units; CBG 201-250: 6 units; CBG 251-300: 8 units; CBG 301-350: 10 units; CBG 351-400: 12 units; CBG >400: 12 units and notify the MD/NP 07/08/20   Coletta Memos, Amy E, NP  insulin aspart (NOVOLOG) 100 UNIT/ML injection Inject 0-5 Units into the skin  at bedtime. 11/01/22   Dahal, Melina Schools, MD  insulin aspart (NOVOLOG) 100 UNIT/ML injection Inject 0-6 Units into the skin 3 (three) times daily with meals. 11/01/22   Dahal, Melina Schools, MD  insulin glargine (LANTUS SOLOSTAR) 100 UNIT/ML Solostar Pen Inject 10 Units into the skin at  bedtime. Patient taking differently: Inject 10 Units into the skin See admin instructions. Inject 10 units into the skin every afternoon- when eating 08/21/20   Marlin Canary U, DO  Insulin Pen Needle 32G X 4 MM MISC Used to inject insulin 3x daily Patient taking differently: 1 each by Other route See admin instructions. Used to inject insulin 3x daily 11/19/16   Romero Belling, MD  levothyroxine (SYNTHROID) 75 MCG tablet Take 1 tablet (75 mcg total) by mouth daily before breakfast. 07/08/20   Fargo, Amy E, NP  mupirocin ointment (BACTROBAN) 2 % Apply 1 Application topically 2 (two) times daily. 09/11/22   [provider]  pantoprazole (PROTONIX) 40 MG tablet Take 40 mg by mouth daily. 09/22/21   [provider]  PHENYTEK 300 MG ER capsule TAKE 1 CAPSULE AT BEDTIME Patient taking differently: Take 300 mg by mouth daily. 04/30/22   Windell Norfolk, MD  phenytoin (DILANTIN) 50 MG tablet Chew 50 mg by mouth See admin instructions. Takes along with 300 mg when needed for tremor.    [provider]  Polyethyl Glycol-Propyl Glycol (SYSTANE) 0.4-0.3 % SOLN Place 2 drops into both eyes 3 (three) times daily.    [provider]  polyethylene glycol (MIRALAX / GLYCOLAX) 17 g packet Take 17 g by mouth daily as needed for mild constipation.    [provider]  primidone (MYSOLINE) 250 MG tablet Take 1 tablet (250 mg total) by mouth daily. 09/21/22 09/16/23  Windell Norfolk, MD  primidone (MYSOLINE) 50 MG tablet Take 3 tablets (150 mg total) by mouth at bedtime. 09/21/22 09/16/23  Windell Norfolk, MD  thiamine 250 MG tablet Take 250 mg by mouth daily.    [provider]  Verapamil HCl CR 200 MG CP24 Take 200 mg by mouth daily. 10/19/20   [provider]    Physical Exam: Vitals:   12/09/22 1259 12/09/22 1305 12/09/22 1306  BP:  (!) 146/96   Pulse:  80   Resp:  20   Temp:  (!) 97.4 F (36.3 C)   TempSrc:  Oral   SpO2: 98% 100%   Weight:   65 kg  Height:    5\' 7"  (1.702 m)   Physical Exam Vitals and nursing note reviewed.  Constitutional:      Appearance: Normal appearance.  HENT:     Head: Normocephalic.     Nose: No rhinorrhea.     Mouth/Throat:     Mouth: Mucous membranes are moist.  Eyes:     General: No scleral icterus.    Pupils: Pupils are equal, round, and reactive to light.  Cardiovascular:     Rate and Rhythm: Normal rate.  Pulmonary:     Effort: Pulmonary effort is normal.     Breath sounds: Normal breath sounds.  Abdominal:     General: Bowel sounds are normal.     Palpations: Abdomen is soft.     Tenderness: There is no abdominal tenderness.  Musculoskeletal:     Cervical back: Neck supple.     Right hip: Tenderness present. Decreased range of motion.     Right lower leg: No edema.     Left lower leg: No edema.  Skin:  General: Skin is warm and dry.  Neurological:     Mental Status: She is alert and oriented to person, place, and time.  Psychiatric:        Mood and Affect: Mood normal.        Behavior: Behavior normal.     Data Reviewed:  Results are pending, will review when available. 12/14/2019 transthoracic echocardiogram. IMPRESSIONS:   1. Left ventricular ejection fraction, by estimation, is 60 to 65%. The left ventricle has normal function. The left ventricle has no regional wall motion abnormalities. Left ventricular diastolic parameters were normal.  2. Right ventricular systolic function is normal. The right ventricular size is normal. There is mildly elevated pulmonary artery systolic pressure.  3. Left atrial size was moderately dilated.  4. The mitral valve is normal in structure. Trivial mitral valve regurgitation. No evidence of mitral stenosis.  5. The aortic valve is tricuspid. Aortic valve regurgitation is not visualized. Mild aortic valve sclerosis is present, with no evidence of aortic valve stenosis.  6. The inferior vena cava is normal in size with greater than  50% respiratory variability, suggesting right atrial pressure of 3 mmHg.  EKG: Vent. rate 76 BPM PR interval * ms QRS duration 89 ms QT/QTcB 411/460 ms P-R-T axes * -25 59 Atrial fibrillation Borderline left axis deviation Anteroseptal infarct, age indeterminate Minimal ST elevation, inferior leads Artifact in lead(s) I II III aVR aVL aVF (Sinus rhythm/atrial fibrillation due to artifact)  Assessment and Plan: Principal Problem:   Closed left hip fracture, initial encounter (HCC) Admit to telemetry/inpatient. Ice area as needed. Buck's traction per protocol. Analgesics as needed. Antiemetics as needed. Consult TOC team. Consult nutritional services. PT evaluation after surgery. Orthopedic surgery evaluation pending.  Active Problems:   Hyponatremia Secondary to chronic diarrhea. Continue gentle normal saline infusion. Resume sodium tablets 2 g p.o. twice daily. Continue cholestyramine 4 g p.o. twice daily. (Cholestyramine should not be taken at the same time with phenytoin)    Mixed hyperlipidemia On cholestyramine as above.    ANEMIA-NOS Monitor hematocrit hemoglobin. Transfuse as needed.    Essential hypertension Continue verapamil 180 mg p.o. daily.    Essential tremor Continue primidone 250 mg p.o. daily. Primidone 150 mg p.o. bedtime.    GERD Continue pantoprazole 40 mg p.o. daily.    Uncontrolled type 2 diabetes mellitus with hyperglycemia,   with long-term current use of insulin (HCC)  Carbohydrate modified diet. Continue Lantus 10 units SQ at bedtime. CBG monitoring with RI SS. Check hemoglobin A1c.    Seizure disorder (HCC) Continue phenytoin 300 mg p.o. bedtime.    Acquired hypothyroidism Continue levothyroxine 75 mcg p.o. daily.    Asthma Bronchodilators as needed.    Constipation Continue MiraLAX as needed. Should be taken in a.m. to avoid phenytoin malabsorption.      Advance Care Planning:   Code Status: Full Code    Consults: Orthopedic surgery ([facility] was consulted)  Family Communication:   Severity of Illness: The appropriate patient status for this patient is INPATIENT. Inpatient status is judged to be reasonable and necessary in order to provide the required intensity of service to ensure the patient's safety. The patient's presenting symptoms, physical exam findings, and initial radiographic and laboratory data in the context of their chronic comorbidities is felt to place them at high risk for further clinical deterioration. Furthermore, it is not anticipated that the patient will be medically stable for discharge from the hospital within 2 midnights of admission.   * I certify  that at the point of admission it is my clinical judgment that the patient will require inpatient hospital care spanning beyond 2 midnights from the point of admission due to high intensity of service, high risk for further deterioration and high frequency of surveillance required.*  Author: Bobette Mo, MD 12/09/2022 2:56 PM  For on call review [email or web address].   This document was prepared using Dragon voice recognition software and Scheel contain some unintended transcription errors.

## 2022-12-09 NOTE — ED Notes (Signed)
ED TO INPATIENT HANDOFF REPORT  ED Nurse Name and Phone #: Deon Pilling [phone]  S Name/Age/Gender [name] 89 y.o. female Room/Bed: WA13/WA13  Code Status   Code Status: Full Code  Home/SNF/Other Home Patient oriented to: self, place, time, and situation Is this baseline? Yes   Triage Complete: Triage complete  Chief Complaint Closed left hip fracture, initial encounter [facility]) [S72.002A]  Triage Note Patient brought in from home by EMS after a fall. States she was trying to put her shirt on lost her balance and fell. She denies LOC and denies thinners. She does report headache and L leg pain. Per EMS patient unable to straighten L leg. EMS started 20g in R wrist and administered of fentanyl. 180/90 74 98% RA   Allergies Allergies  Allergen Reactions   Bystolic [Nebivolol Hcl] Other (See Comments)    "extreme weakness, heaviness in legs & arms, swelling in legs/arms/face, swollen abdomen, pain in bladder, feet pain, soreness in chest"   Carbamazepine Other (See Comments)    Blood poisoning   Cholestyramine Itching, Nausea And Vomiting, Rash and Other (See Comments)    "itching rash on stomach, bloated, nausea, vomiting, sleeplessness, extreme pain in arms"  Patient is taking this in 2022.   Morphine Other (See Comments)    Feels morbid, weak, still in pain   Niacin Palpitations    Fast heart beat Other reaction(s): Unknown   Norvasc [Amlodipine Besylate] Other (See Comments)    "extreme fluid retention/pain)   Optivar [Azelastine Hcl] Photosensitivity   Repaglinide Hives   Sular [Nisoldipine Er] Other (See Comments)    "severe headaches, swelling eyes, hands, feet, shortness of breath, weak, flushed face, brain boiling, fluid retention, high blood sugar, nervous, heart fast beating"   Telmisartan Other (See Comments)    "headache, difficulty urinating, high blood sugar, fluid retention"   Amoxicillin-Pot Clavulanate Rash    Tolerates Unasyn (>10 doses)   Cefdinir  Swelling    Vaginal irritation, breathing,    Ciprofloxacin Hcl Hives   Clonidine Other (See Comments)    Dry mouth, fluid retention   Codeine Nausea And Vomiting   Ezetimibe Other (See Comments)    Made weak   Hydroxychloroquine Other (See Comments)    Low platlets   Naproxen Other (See Comments)    Shrinks bladder   Sulfa Antibiotics Rash   Ziac [Bisoprolol-Hydrochlorothiazide] Other (See Comments)    "stopped urination"   Amlodipine Besy-Benazepril Hcl Cough   Azelastine Other (See Comments)    Unknown reaction - Per MAR   Elavil [Amitriptyline] Other (See Comments)    Gave Pt nightmares   Empagliflozin Other (See Comments)    "Caused yeast infection, slowed my urine"   Hydralazine Other (See Comments)    "does not reduce high blood pressure, pain in arm, high pressure, felt like I was on verge of heart attack, really weak"   Iodine I 131 Tositumomab Other (See Comments)    Unknown reaction - Per MAR   Kenalog [Triamcinolone] Diarrhea    Per MAR   Keppra [Levetiracetam] Other (See Comments)    Shaking   Lamotrigine Itching and Other (See Comments)   Pregabalin Swelling and Other (See Comments)    Weight gain   Pseudoephedrine Other (See Comments)    Unknown reaction   Pseudoephedrine-Guaifenesin Er Other (See Comments)    Unknown reaction - Per MAR   Risedronate     Unknown reaction - Per MAR   Ru-Hist D [Brompheniramine-Phenylephrine] Other (See Comments)  Unknown reaction - Per MAR   Sumatriptan Other (See Comments)   Topiramate Other (See Comments)    Dry eyes   Ace Inhibitors Other (See Comments)    unknown   Aspirin Other (See Comments)    Unknown reaction   Atacand [Candesartan] Other (See Comments)    Unknown reaction - MAR   Bextra [Valdecoxib] Other (See Comments)    Unknown reaction - Per MAR   Bisoprolol-Hydrochlorothiazide Other (See Comments)    Unknown reaction - Per MAR   Cefadroxil Other (See Comments)    Unknown reaction   Celecoxib Rash    Hydrocodone Other (See Comments)    unknown   Hydrocodone-Acetaminophen Other (See Comments)    unknown   Iodinated Contrast Media Rash and Other (See Comments)    "All over" rash   Meloxicam Other (See Comments)    unknown   Methylprednisolone Sodium Succinate Other (See Comments)    unknown   Nabumetone Other (See Comments)    Unknown reaction   Penicillins Other (See Comments)    unknown   Pseudoephedrine-Guaifenesin Other (See Comments)    unknown   Rofecoxib Other (See Comments)    Unknown reaction - MAR Other reaction(s): Unknown   Ru-Tuss [Chlorphen-Pse-Atrop-Hyos-Scop] Other (See Comments)    unknown   Sulfonamide Derivatives Other (See Comments)    unknown   Sulphur [Elemental Sulfur] Other (See Comments)    unknown   Telithromycin Other (See Comments)    unknown   Terfenadine Other (See Comments)    Unknown reaction - Per MAR   Trandolapril-Verapamil Hcl Er Other (See Comments)    Headache, difficulty urinating, high blood sugar, fluid retention  Pt is taking Tarka (trandolapril-verapamil) currently, but requests the medication stay in her allergy list   Valium [Diazepam] Other (See Comments)    Makes her mean and hyper    Level of Care/Admitting Diagnosis ED Disposition     ED Disposition  Admit   Condition  --   Comment  Hospital Area: [facility] [[id number]]  Level of Care: Telemetry [5]  Admit to tele based on following criteria: Monitor for Ischemic changes  [facility] to [facility] or [facility] if equivalent level of care is available:: No  Covid Evaluation: Asymptomatic - no recent exposure (last 10 days) testing not required  Diagnosis: Closed left hip fracture, initial encounter [facility]) [[id number]]  Admitting Physician: Bobette Mo [[id number]]  Attending Physician: Bobette Mo [[id number]]  Certification:: I certify this patient will need inpatient services for at least 2 midnights  Estimated Length of Stay:  3          B Medical/Surgery History Past Medical History:  Diagnosis Date   Anemia    Anxiety    Asthma    Breast cancer (HCC) 2014   right breast   Cancer of right breast (HCC) 12/26/2012   right breast 12:00 o'clock, DCIS   Carotid artery disease (HCC)    Carpal tunnel syndrome, bilateral    Chronic bronchitis (HCC)    Chronic cough    Chronic diarrhea    3 years   Chronic facial pain    Chronic foot pain    Colon cancer (HCC) dx'd 11/2019   Complication of anesthesia    Sore jaw; could not chew or move mouth, prolonged sedation   Convulsions/seizures (HCC) 10/16/2014   Diabetes mellitus    type 2 niddm x 20 years   Dyslipidemia    Ejection fraction  Gait abnormality 12/04/2019   GERD (gastroesophageal reflux disease)    Hammer toe    bilateral   History of colonic polyps    History of meningioma    HTN (hypertension)    Hx of radiation therapy 03/07/13- 03/29/13   right breast 4250 cGy 17 sessions   Hyperlipidemia    Hypokalemia    Hyponatremia    Hypothyroidism    IBS (irritable bowel syndrome)    Melanoma (HCC)    Metatarsal bone fracture 2014   Multiple drug allergies    Nausea & vomiting 03/12/2020   Nontoxic thyroid nodule    Obesity    Osteoarthritis    Osteoporosis    Palpitations    Personal history of radiation therapy 2014   Seasonal allergies    Skin cancer    Syncope    Tremor, essential 08/18/2016   Vitamin B12 deficiency    Vitamin D deficiency    Past Surgical History:  Procedure Laterality Date   ABDOMINAL HYSTERECTOMY     BIOPSY  11/07/2019   Procedure: BIOPSY;  Surgeon: Bernette Redbird, MD;  Location: [facility];  Service: Endoscopy;;   BRAIN SURGERY     BREAST BIOPSY Right 01/24/2013   Procedure: RE-EXCICION OF BREAST CANCER, ANTERIOR MARGINS;  Surgeon: Mariella Saa, MD;  Location: [facility];  Service: General;  Laterality: Right;   BREAST LUMPECTOMY Right 2014   BREAST LUMPECTOMY WITH NEEDLE LOCALIZATION Right  01/17/2013   Procedure: BREAST LUMPECTOMY WITH NEEDLE LOCALIZATION;  Surgeon: Mariella Saa, MD;  Location: [facility] OR;  Service: General;  Laterality: Right;   BUNIONECTOMY Bilateral    CATARACT EXTRACTION W/ INTRAOCULAR LENS IMPLANT Right    CHOLECYSTECTOMY     COLONOSCOPY     COLONOSCOPY WITH PROPOFOL N/A 11/07/2019   Procedure: COLONOSCOPY WITH PROPOFOL;  Surgeon: Bernette Redbird, MD;  Location: [facility];  Service: Endoscopy;  Laterality: N/A;   CRANIOTOMY Right 10/18/2013   Procedure: CRANIOTOMY TUMOR EXCISION;  Surgeon: Karn Cassis, MD;  Location: [facility];  Service: Neurosurgery;  Laterality: Right;   ESOPHAGOGASTRODUODENOSCOPY (EGD) WITH PROPOFOL N/A 11/07/2019   Procedure: ESOPHAGOGASTRODUODENOSCOPY (EGD) WITH PROPOFOL;  Surgeon: Bernette Redbird, MD;  Location: [facility];  Service: Endoscopy;  Laterality: N/A;   EYE SURGERY     HERNIA REPAIR     IR THORACENTESIS ASP PLEURAL SPACE W/IMG GUIDE  04/01/2020   KNEE ARTHROSCOPY Bilateral    LAPAROSCOPIC RIGHT HEMI COLECTOMY Right 03/06/2020   Procedure: LAPAROSCOPIC RIGHT HEMI COLECTOMY WITH TAP BLOCK AND LYSIS OF ADHESIONS;  Surgeon: Andria Meuse, MD;  Location: [facility];  Service: General;  Laterality: Right;   POLYPECTOMY     small adenomatous   TOTAL HIP ARTHROPLASTY Right 06/05/2020   Procedure: ARTHROPLASTY  ANTERIOR APPROACH. RIGHT HIP;  Surgeon: Samson Frederic, MD;  Location: [facility];  Service: Orthopedics;  Laterality: Right;   ULNAR TUNNEL RELEASE       A IV Location/Drains/Wounds Patient Lines/Drains/Airways Status     Active Line/Drains/Airways     Name Placement date Placement time Site Days   Peripheral IV 12/09/22 20 G 1" Right Wrist 12/09/22  1301  Wrist  less than 1   Peripheral IV 12/09/22 20 G 1" Left Antecubital 12/09/22  1319  Antecubital  less than 1            Intake/Output Last 24 hours  Intake/Output Summary (Last 24 hours) at 12/09/2022 1949 Last data filed at 12/09/2022 1516 Gross  per 24 hour  Intake 239.58  ml  Output --  Net 239.58 ml    Labs/Imaging Results for orders placed or performed during the hospital encounter of 12/09/22 (from the past 48 hour(s))  Basic metabolic panel     Status: Abnormal   Collection Time: 12/09/22  1:19 PM  Result Value Ref Range   Sodium 125 (L) 135 - 145 mmol/L   Potassium 4.4 3.5 - 5.1 mmol/L   Chloride 94 (L) 98 - 111 mmol/L   CO2 21 (L) 22 - 32 mmol/L   Glucose, Bld 180 (H) 70 - 99 mg/dL    Comment: Glucose reference range applies only to samples taken after fasting for at least 8 hours.   BUN 13 8 - 23 mg/dL   Creatinine, Ser 4.09 0.44 - 1.00 mg/dL   Calcium 8.5 (L) 8.9 - 10.3 mg/dL   GFR, Estimated >81 >19 mL/min    Comment: (NOTE) Calculated using the CKD-EPI Creatinine Equation (2021)    Anion gap 10 5 - 15    Comment: Performed at [facility], [address], [place], [place] [address]  CBC with Differential     Status: Abnormal   Collection Time: 12/09/22  1:19 PM  Result Value Ref Range   WBC 8.3 4.0 - 10.5 K/uL   RBC 3.29 (L) 3.87 - 5.11 MIL/uL   Hemoglobin 9.7 (L) 12.0 - 15.0 g/dL   HCT 95.6 (L) 21.3 - 08.6 %   MCV 93.3 80.0 - 100.0 fL   MCH 29.5 26.0 - 34.0 pg   MCHC 31.6 30.0 - 36.0 g/dL   RDW 57.8 46.9 - 62.9 %   Platelets 293 150 - 400 K/uL   nRBC 0.0 0.0 - 0.2 %   Neutrophils Relative % 78 %   Neutro Abs 6.6 1.7 - 7.7 K/uL   Lymphocytes Relative 14 %   Lymphs Abs 1.1 0.7 - 4.0 K/uL   Monocytes Relative 6 %   Monocytes Absolute 0.5 0.1 - 1.0 K/uL   Eosinophils Relative 1 %   Eosinophils Absolute 0.0 0.0 - 0.5 K/uL   Basophils Relative 0 %   Basophils Absolute 0.0 0.0 - 0.1 K/uL   Immature Granulocytes 1 %   Abs Immature Granulocytes 0.05 0.00 - 0.07 K/uL    Comment: Performed at [facility], [address] [place], [place] [address]  Protime-INR     Status: None   Collection Time: 12/09/22  1:19 PM  Result Value Ref Range   Prothrombin Time 13.6 11.4  - 15.2 seconds   INR 1.0 0.8 - 1.2    Comment: (NOTE) INR goal varies based on device and disease states. Performed at [facility], [address], [place], [place] [address]   Type and screen [facility]     Status: None   Collection Time: 12/09/22  1:19 PM  Result Value Ref Range   ABO/RH(D) A POS    Antibody Screen NEG    Sample Expiration      12/12/2022,2359 Performed at [facility], [address] [place], [place] [address]   Phenytoin level, total     Status: Abnormal   Collection Time: 12/09/22  1:19 PM  Result Value Ref Range   Phenytoin Lvl 6.2 (L) 10.0 - 20.0 ug/mL    Comment: Performed at [facility], [address] [place], [place] [address]   *Note: Due to a large number of results and/or encounters for the requested time period, some results  have not been displayed. A complete set of results can be found in Results Review.   DG Chest 1 View  Result Date: 12/09/2022 CLINICAL DATA:  Pain after fall EXAM: CHEST  1 VIEW COMPARISON:  12/21/2021 FINDINGS: No consolidation, pneumothorax or effusion. No edema. Normal cardiopericardial silhouette. Tortuous aorta. Overlapping cardiac leads. Surgical clips in the right axillary region. IMPRESSION: No acute cardiopulmonary disease. Electronically Signed   By: Karen Kays M.D.   On: 12/09/2022 14:42   DG Hip Unilat With Pelvis 2-3 Views Left  Result Date: 12/09/2022 CLINICAL DATA:  Pain after fall EXAM: DG HIP (WITH OR WITHOUT PELVIS) 3V LEFT COMPARISON:  X-ray 06/11/2022 FINDINGS: There is a minimally displaced subcapital femoral neck fracture of the left hip. Osteopenia. Right hemiarthroplasty identified. Expected alignment. The upper aspect of the pelvis is clipped off the edge of the film. IMPRESSION: Mm displaced subcapital femoral neck fracture of the left hip. Osteopenia. Right hip hemiarthroplasty Electronically Signed   By: Karen Kays M.D.   On:  12/09/2022 14:41    Pending Labs Unresulted Labs (From admission, onward)     Start     Ordered   12/10/22 0500  CBC  Tomorrow morning,   R        12/09/22 1552   12/10/22 0500  Comprehensive metabolic panel  Tomorrow morning,   R        12/09/22 1552            Vitals/Pain Today's Vitals   12/09/22 1650 12/09/22 1755 12/09/22 1801 12/09/22 1825  BP:    (!) 158/70  Pulse:    79  Resp:    (!) 22  Temp:  97.8 F (36.6 C)    TempSrc:  Oral    SpO2:    100%  Weight:      Height:      PainSc: Asleep  10-Worst pain ever     Isolation Precautions No active isolations  Medications Medications  0.9 %  sodium chloride infusion ( Intravenous New Bag/Given 12/09/22 1553)  oxyCODONE (Oxy IR/ROXICODONE) immediate release tablet 5 mg (5 mg Oral Given 12/09/22 1731)  ondansetron (ZOFRAN) tablet 4 mg (has no administration in time range)    Or  ondansetron (ZOFRAN) injection 4 mg (has no administration in time range)  sodium chloride tablet 2 g (has no administration in time range)  phenytoin (DILANTIN) chewable tablet 50 mg (has no administration in time range)  primidone (MYSOLINE) tablet 250 mg (has no administration in time range)  primidone (MYSOLINE) tablet 150 mg (has no administration in time range)  polyethylene glycol (MIRALAX / GLYCOLAX) packet 17 g (has no administration in time range)  pantoprazole (PROTONIX) EC tablet 40 mg (has no administration in time range)  insulin glargine-yfgn (SEMGLEE) injection 10 Units (has no administration in time range)  levothyroxine (SYNTHROID) tablet 75 mcg (has no administration in time range)  cyanocobalamin (VITAMIN B12) injection 1,000 mcg (has no administration in time range)  cholestyramine light (PREVALITE) packet 4 g (has no administration in time range)  insulin aspart (novoLOG) injection 0-9 Units (has no administration in time range)  phenytoin (DILANTIN) ER capsule 300 mg (has no administration in time range)  verapamil  (CALAN-SR) CR tablet 180 mg (has no administration in time range)  fentaNYL (SUBLIMAZE) injection 100 mcg (100 mcg Intravenous Given 12/09/22 1406)  fentaNYL (SUBLIMAZE) injection 50 mcg (50 mcg Intravenous Given 12/09/22 1554)    Mobility Normally walked before fall     Focused Assessments  R Recommendations: See Admitting Provider Note  Report given to:   Additional Notes:

## 2022-12-09 NOTE — ED Notes (Signed)
Pt sleeping comfortably in bed.

## 2022-12-09 NOTE — ED Notes (Signed)
ED TO INPATIENT HANDOFF REPORT  ED Nurse Name and Phone #: Crist Infante RN [phone]  S Name/Age/Gender [name] 89 y.o. female Room/Bed: WA13/WA13  Code Status   Code Status: Full Code  Home/SNF/Other Home Patient oriented to: self, place, time, and situation Is this baseline? Yes   Triage Complete: Triage complete  Chief Complaint Closed left hip fracture, initial encounter [facility]) [S72.002A]  Triage Note Patient brought in from home by EMS after a fall. States she was trying to put her shirt on lost her balance and fell. She denies LOC and denies thinners. She does report headache and L leg pain. Per EMS patient unable to straighten L leg. EMS started 20g in R wrist and administered of fentanyl. 180/90 74 98% RA   Allergies Allergies  Allergen Reactions   Bystolic [Nebivolol Hcl] Other (See Comments)    "extreme weakness, heaviness in legs & arms, swelling in legs/arms/face, swollen abdomen, pain in bladder, feet pain, soreness in chest"   Carbamazepine Other (See Comments)    Blood poisoning   Cholestyramine Itching, Nausea And Vomiting, Rash and Other (See Comments)    "itching rash on stomach, bloated, nausea, vomiting, sleeplessness, extreme pain in arms"  Patient is taking this in 2022.   Morphine Other (See Comments)    Feels morbid, weak, still in pain   Niacin Palpitations    Fast heart beat Other reaction(s): Unknown   Norvasc [Amlodipine Besylate] Other (See Comments)    "extreme fluid retention/pain)   Optivar [Azelastine Hcl] Photosensitivity   Repaglinide Hives   Sular [Nisoldipine Er] Other (See Comments)    "severe headaches, swelling eyes, hands, feet, shortness of breath, weak, flushed face, brain boiling, fluid retention, high blood sugar, nervous, heart fast beating"   Telmisartan Other (See Comments)    "headache, difficulty urinating, high blood sugar, fluid retention"   Amoxicillin-Pot Clavulanate Rash    Tolerates Unasyn (>10 doses)    Cefdinir Swelling    Vaginal irritation, breathing,    Ciprofloxacin Hcl Hives   Clonidine Other (See Comments)    Dry mouth, fluid retention   Codeine Nausea And Vomiting   Ezetimibe Other (See Comments)    Made weak   Hydroxychloroquine Other (See Comments)    Low platlets   Naproxen Other (See Comments)    Shrinks bladder   Sulfa Antibiotics Rash   Ziac [Bisoprolol-Hydrochlorothiazide] Other (See Comments)    "stopped urination"   Amlodipine Besy-Benazepril Hcl Cough   Azelastine Other (See Comments)    Unknown reaction - Per MAR   Elavil [Amitriptyline] Other (See Comments)    Gave Pt nightmares   Empagliflozin Other (See Comments)    "Caused yeast infection, slowed my urine"   Hydralazine Other (See Comments)    "does not reduce high blood pressure, pain in arm, high pressure, felt like I was on verge of heart attack, really weak"   Iodine I 131 Tositumomab Other (See Comments)    Unknown reaction - Per MAR   Kenalog [Triamcinolone] Diarrhea    Per MAR   Keppra [Levetiracetam] Other (See Comments)    Shaking   Lamotrigine Itching and Other (See Comments)   Pregabalin Swelling and Other (See Comments)    Weight gain   Pseudoephedrine Other (See Comments)    Unknown reaction   Pseudoephedrine-Guaifenesin Er Other (See Comments)    Unknown reaction - Per MAR   Risedronate     Unknown reaction - Per MAR   Ru-Hist D [Brompheniramine-Phenylephrine] Other (See Comments)  Unknown reaction - Per MAR   Sumatriptan Other (See Comments)   Topiramate Other (See Comments)    Dry eyes   Ace Inhibitors Other (See Comments)    unknown   Aspirin Other (See Comments)    Unknown reaction   Atacand [Candesartan] Other (See Comments)    Unknown reaction - MAR   Bextra [Valdecoxib] Other (See Comments)    Unknown reaction - Per MAR   Bisoprolol-Hydrochlorothiazide Other (See Comments)    Unknown reaction - Per MAR   Cefadroxil Other (See Comments)    Unknown reaction    Celecoxib Rash   Hydrocodone Other (See Comments)    unknown   Hydrocodone-Acetaminophen Other (See Comments)    unknown   Iodinated Contrast Media Rash and Other (See Comments)    "All over" rash   Meloxicam Other (See Comments)    unknown   Methylprednisolone Sodium Succinate Other (See Comments)    unknown   Nabumetone Other (See Comments)    Unknown reaction   Penicillins Other (See Comments)    unknown   Pseudoephedrine-Guaifenesin Other (See Comments)    unknown   Rofecoxib Other (See Comments)    Unknown reaction - MAR Other reaction(s): Unknown   Ru-Tuss [Chlorphen-Pse-Atrop-Hyos-Scop] Other (See Comments)    unknown   Sulfonamide Derivatives Other (See Comments)    unknown   Sulphur [Elemental Sulfur] Other (See Comments)    unknown   Telithromycin Other (See Comments)    unknown   Terfenadine Other (See Comments)    Unknown reaction - Per MAR   Trandolapril-Verapamil Hcl Er Other (See Comments)    Headache, difficulty urinating, high blood sugar, fluid retention  Pt is taking Tarka (trandolapril-verapamil) currently, but requests the medication stay in her allergy list   Valium [Diazepam] Other (See Comments)    Makes her mean and hyper    Level of Care/Admitting Diagnosis ED Disposition     ED Disposition  Admit   Condition  --   Comment  Hospital Area: [facility] [[id number]]  Level of Care: Progressive [102]  Admit to Progressive based on following criteria: NEPHROLOGY stable condition requiring close monitoring for AKI, requiring Hemodialysis or Peritoneal Dialysis either from expected electrolyte imbalance, acidosis, or fluid overload that can be managed by NIPPV or high flow oxygen.  Admit to Progressive based on following criteria: MULTISYSTEM THREATS such as stable sepsis, metabolic/electrolyte imbalance with or without encephalopathy that is responding to early treatment.  [facility] admit [facility] to [facility] or [facility] if equivalent  level of care is available:: No  Covid Evaluation: Asymptomatic - no recent exposure (last 10 days) testing not required  Diagnosis: Closed left hip fracture, initial encounter [facility]) [[id number]]  Admitting Physician: Bobette Mo [[id number]]  Attending Physician: Bobette Mo [[id number]]  Certification:: I certify this patient will need inpatient services for at least 2 midnights  Estimated Length of Stay: 3          B Medical/Surgery History Past Medical History:  Diagnosis Date   Anemia    Anxiety    Asthma    Breast cancer (HCC) 2014   right breast   Cancer of right breast (HCC) 12/26/2012   right breast 12:00 o'clock, DCIS   Carotid artery disease (HCC)    Carpal tunnel syndrome, bilateral    Chronic bronchitis (HCC)    Chronic cough    Chronic diarrhea    3 years   Chronic facial pain    Chronic foot pain  Colon cancer (HCC) dx'd 11/2019   Complication of anesthesia    Sore jaw; could not chew or move mouth, prolonged sedation   Convulsions/seizures (HCC) 10/16/2014   Diabetes mellitus    type 2 niddm x 20 years   Dyslipidemia    Ejection fraction    Gait abnormality 12/04/2019   GERD (gastroesophageal reflux disease)    Hammer toe    bilateral   History of colonic polyps    History of meningioma    HTN (hypertension)    Hx of radiation therapy 03/07/13- 03/29/13   right breast 4250 cGy 17 sessions   Hyperlipidemia    Hypokalemia    Hyponatremia    Hypothyroidism    IBS (irritable bowel syndrome)    Melanoma (HCC)    Metatarsal bone fracture 2014   Multiple drug allergies    Nausea & vomiting 03/12/2020   Nontoxic thyroid nodule    Obesity    Osteoarthritis    Osteoporosis    Palpitations    Personal history of radiation therapy 2014   Seasonal allergies    Skin cancer    Syncope    Tremor, essential 08/18/2016   Vitamin B12 deficiency    Vitamin D deficiency    Past Surgical History:  Procedure Laterality Date   ABDOMINAL  HYSTERECTOMY     BIOPSY  11/07/2019   Procedure: BIOPSY;  Surgeon: Bernette Redbird, MD;  Location: [facility];  Service: Endoscopy;;   BRAIN SURGERY     BREAST BIOPSY Right 01/24/2013   Procedure: RE-EXCICION OF BREAST CANCER, ANTERIOR MARGINS;  Surgeon: Mariella Saa, MD;  Location: [facility]S;  Service: General;  Laterality: Right;   BREAST LUMPECTOMY Right 2014   BREAST LUMPECTOMY WITH NEEDLE LOCALIZATION Right 01/17/2013   Procedure: BREAST LUMPECTOMY WITH NEEDLE LOCALIZATION;  Surgeon: Mariella Saa, MD;  Location: [facility] OR;  Service: General;  Laterality: Right;   BUNIONECTOMY Bilateral    CATARACT EXTRACTION W/ INTRAOCULAR LENS IMPLANT Right    CHOLECYSTECTOMY     COLONOSCOPY     COLONOSCOPY WITH PROPOFOL N/A 11/07/2019   Procedure: COLONOSCOPY WITH PROPOFOL;  Surgeon: Bernette Redbird, MD;  Location: [facility];  Service: Endoscopy;  Laterality: N/A;   CRANIOTOMY Right 10/18/2013   Procedure: CRANIOTOMY TUMOR EXCISION;  Surgeon: Karn Cassis, MD;  Location: [facility];  Service: Neurosurgery;  Laterality: Right;   ESOPHAGOGASTRODUODENOSCOPY (EGD) WITH PROPOFOL N/A 11/07/2019   Procedure: ESOPHAGOGASTRODUODENOSCOPY (EGD) WITH PROPOFOL;  Surgeon: Bernette Redbird, MD;  Location: [facility];  Service: Endoscopy;  Laterality: N/A;   EYE SURGERY     HERNIA REPAIR     IR THORACENTESIS ASP PLEURAL SPACE W/IMG GUIDE  04/01/2020   KNEE ARTHROSCOPY Bilateral    LAPAROSCOPIC RIGHT HEMI COLECTOMY Right 03/06/2020   Procedure: LAPAROSCOPIC RIGHT HEMI COLECTOMY WITH TAP BLOCK AND LYSIS OF ADHESIONS;  Surgeon: Andria Meuse, MD;  Location: [facility];  Service: General;  Laterality: Right;   POLYPECTOMY     small adenomatous   TOTAL HIP ARTHROPLASTY Right 06/05/2020   Procedure: ARTHROPLASTY  ANTERIOR APPROACH. RIGHT HIP;  Surgeon: Samson Frederic, MD;  Location: [facility];  Service: Orthopedics;  Laterality: Right;   ULNAR TUNNEL RELEASE       A IV Location/Drains/Wounds Patient  Lines/Drains/Airways Status     Active Line/Drains/Airways     Name Placement date Placement time Site Days   Peripheral IV 12/09/22 20 G 1" Right Wrist 12/09/22  1301  Wrist  less than 1   Peripheral IV  12/09/22 20 G 1" Left Antecubital 12/09/22  1319  Antecubital  less than 1            Intake/Output Last 24 hours  Intake/Output Summary (Last 24 hours) at 12/09/2022 1828 Last data filed at 12/09/2022 1516 Gross per 24 hour  Intake 239.58 ml  Output --  Net 239.58 ml    Labs/Imaging Results for orders placed or performed during the hospital encounter of 12/09/22 (from the past 48 hour(s))  Basic metabolic panel     Status: Abnormal   Collection Time: 12/09/22  1:19 PM  Result Value Ref Range   Sodium 125 (L) 135 - 145 mmol/L   Potassium 4.4 3.5 - 5.1 mmol/L   Chloride 94 (L) 98 - 111 mmol/L   CO2 21 (L) 22 - 32 mmol/L   Glucose, Bld 180 (H) 70 - 99 mg/dL    Comment: Glucose reference range applies only to samples taken after fasting for at least 8 hours.   BUN 13 8 - 23 mg/dL   Creatinine, Ser 1.61 0.44 - 1.00 mg/dL   Calcium 8.5 (L) 8.9 - 10.3 mg/dL   GFR, Estimated >09 >60 mL/min    Comment: (NOTE) Calculated using the CKD-EPI Creatinine Equation (2021)    Anion gap 10 5 - 15    Comment: Performed at [facility], [address], [place], [place] [address]  CBC with Differential     Status: Abnormal   Collection Time: 12/09/22  1:19 PM  Result Value Ref Range   WBC 8.3 4.0 - 10.5 K/uL   RBC 3.29 (L) 3.87 - 5.11 MIL/uL   Hemoglobin 9.7 (L) 12.0 - 15.0 g/dL   HCT 81.1 (L) 91.4 - 78.2 %   MCV 93.3 80.0 - 100.0 fL   MCH 29.5 26.0 - 34.0 pg   MCHC 31.6 30.0 - 36.0 g/dL   RDW 95.6 21.3 - 08.6 %   Platelets 293 150 - 400 K/uL   nRBC 0.0 0.0 - 0.2 %   Neutrophils Relative % 78 %   Neutro Abs 6.6 1.7 - 7.7 K/uL   Lymphocytes Relative 14 %   Lymphs Abs 1.1 0.7 - 4.0 K/uL   Monocytes Relative 6 %   Monocytes Absolute 0.5 0.1 - 1.0 K/uL    Eosinophils Relative 1 %   Eosinophils Absolute 0.0 0.0 - 0.5 K/uL   Basophils Relative 0 %   Basophils Absolute 0.0 0.0 - 0.1 K/uL   Immature Granulocytes 1 %   Abs Immature Granulocytes 0.05 0.00 - 0.07 K/uL    Comment: Performed at [facility], [address], [place], [place] [address]  Protime-INR     Status: None   Collection Time: 12/09/22  1:19 PM  Result Value Ref Range   Prothrombin Time 13.6 11.4 - 15.2 seconds   INR 1.0 0.8 - 1.2    Comment: (NOTE) INR goal varies based on device and disease states. Performed at [facility], [address], [place], [place] [address]   Type and screen [facility]     Status: None   Collection Time: 12/09/22  1:19 PM  Result Value Ref Range   ABO/RH(D) A POS    Antibody Screen NEG    Sample Expiration      12/12/2022,2359 Performed at [facility], [address], [place], [place] [address]   Phenytoin level, total     Status: Abnormal   Collection Time: 12/09/22  1:19  PM  Result Value Ref Range   Phenytoin Lvl 6.2 (L) 10.0 - 20.0 ug/mL    Comment: Performed at [facility], [address] [place], [place] [address]   *Note: Due to a large number of results and/or encounters for the requested time period, some results have not been displayed. A complete set of results can be found in Results Review.   DG Chest 1 View  Result Date: 12/09/2022 CLINICAL DATA:  Pain after fall EXAM: CHEST  1 VIEW COMPARISON:  12/21/2021 FINDINGS: No consolidation, pneumothorax or effusion. No edema. Normal cardiopericardial silhouette. Tortuous aorta. Overlapping cardiac leads. Surgical clips in the right axillary region. IMPRESSION: No acute cardiopulmonary disease. Electronically Signed   By: Karen Kays M.D.   On: 12/09/2022 14:42   DG Hip Unilat With Pelvis 2-3 Views Left  Result Date: 12/09/2022 CLINICAL DATA:  Pain after fall EXAM: DG HIP (WITH OR  WITHOUT PELVIS) 3V LEFT COMPARISON:  X-ray 06/11/2022 FINDINGS: There is a minimally displaced subcapital femoral neck fracture of the left hip. Osteopenia. Right hemiarthroplasty identified. Expected alignment. The upper aspect of the pelvis is clipped off the edge of the film. IMPRESSION: Mm displaced subcapital femoral neck fracture of the left hip. Osteopenia. Right hip hemiarthroplasty Electronically Signed   By: Karen Kays M.D.   On: 12/09/2022 14:41    Pending Labs Unresulted Labs (From admission, onward)     Start     Ordered   12/10/22 0500  CBC  Tomorrow morning,   R        12/09/22 1552   12/10/22 0500  Comprehensive metabolic panel  Tomorrow morning,   R        12/09/22 1552            Vitals/Pain Today's Vitals   12/09/22 1650 12/09/22 1755 12/09/22 1801 12/09/22 1825  BP:    (!) 158/70  Pulse:    79  Resp:    (!) 22  Temp:  97.8 F (36.6 C)    TempSrc:  Oral    SpO2:    100%  Weight:      Height:      PainSc: Asleep  10-Worst pain ever     Isolation Precautions No active isolations  Medications Medications  0.9 %  sodium chloride infusion ( Intravenous New Bag/Given 12/09/22 1553)  oxyCODONE (Oxy IR/ROXICODONE) immediate release tablet 5 mg (5 mg Oral Given 12/09/22 1731)  ondansetron (ZOFRAN) tablet 4 mg (has no administration in time range)    Or  ondansetron (ZOFRAN) injection 4 mg (has no administration in time range)  fentaNYL (SUBLIMAZE) injection 100 mcg (100 mcg Intravenous Given 12/09/22 1406)  fentaNYL (SUBLIMAZE) injection 50 mcg (50 mcg Intravenous Given 12/09/22 1554)    Mobility walks     Focused Assessments Neuro Assessment Handoff:  Swallow screen pass?  N/A Cardiac Rhythm: Normal sinus rhythm       Neuro Assessment: Within Defined Limits Neuro Checks:      Has TPA been given? No If patient is a Neuro Trauma and patient is going to OR before floor call report to 4N Charge nurse: [phone] or [phone]   R Recommendations:  See Admitting Provider Note  Report given to:   Additional Notes:

## 2022-12-09 NOTE — ED Provider Notes (Signed)
[place] EMERGENCY DEPARTMENT AT [facility] Provider Note   CSN: [id number] Arrival date & time: 12/09/22  1254     History  Chief Complaint  Patient presents with   [name] is a 89 y.o. female.   Fall  Patient presents after a fall.  Was at home.  Reportedly lost her balance attempt to put a shirt on.  Now severe left hip pain.  Cannot really move left hand.  Had fentanyl by EMS.  Did not hit head.  Not on blood thinners.  No neck pain.  Previously had a right hip replaced by Dr. Linna Caprice and would like to see him again if possible.    Past Medical History:  Diagnosis Date   Anemia    Anxiety    Asthma    Breast cancer (HCC) 2014   right breast   Cancer of right breast (HCC) 12/26/2012   right breast 12:00 o'clock, DCIS   Carotid artery disease (HCC)    Carpal tunnel syndrome, bilateral    Chronic bronchitis (HCC)    Chronic cough    Chronic diarrhea    3 years   Chronic facial pain    Chronic foot pain    Colon cancer (HCC) dx'd 11/2019   Complication of anesthesia    Sore jaw; could not chew or move mouth, prolonged sedation   Convulsions/seizures (HCC) 10/16/2014   Diabetes mellitus    type 2 niddm x 20 years   Dyslipidemia    Ejection fraction    Gait abnormality 12/04/2019   GERD (gastroesophageal reflux disease)    Hammer toe    bilateral   History of colonic polyps    History of meningioma    HTN (hypertension)    Hx of radiation therapy 03/07/13- 03/29/13   right breast 4250 cGy 17 sessions   Hyperlipidemia    Hypokalemia    Hyponatremia    Hypothyroidism    IBS (irritable bowel syndrome)    Melanoma (HCC)    Metatarsal bone fracture 2014   Multiple drug allergies    Nausea & vomiting 03/12/2020   Nontoxic thyroid nodule    Obesity    Osteoarthritis    Osteoporosis    Palpitations    Personal history of radiation therapy 2014   Seasonal allergies    Skin cancer    Syncope    Tremor, essential 08/18/2016    Vitamin B12 deficiency    Vitamin D deficiency     Home Medications Prior to Admission medications   Medication Sig Start Date End Date Taking? Authorizing Provider  Biotin 5000 MCG CAPS Take 5,000 mcg by mouth daily.    [provider]  Blood Glucose Monitoring Suppl (PRODIGY VOICE BLOOD GLUCOSE) w/Device KIT Use to check blood sugar 1 time per day. Patient not taking: Reported on 09/21/2022 10/18/15   Romero Belling, MD  capsicum (ZOSTRIX) 0.075 % topical cream Apply 1 Application topically 4 (four) times daily as needed (pain). 08/04/21   [provider]  Cholecalciferol (VITAMIN D-3) 125 MCG (5000 UT) TABS Take 5,000 Units by mouth daily.    [provider]  cholestyramine (QUESTRAN) 4 g packet Take 4 g by mouth 2 (two) times daily.    [provider]  cholestyramine light (PREVALITE) 4 g packet Take 1 packet (4 g total) by mouth 2 (two) times daily. 11/01/22   Lorin Glass, MD  cyanocobalamin (,VITAMIN B-12,) 1000 MCG/ML injection Inject 1,000 mcg into the muscle  every 21 ( twenty-one) days.    [provider]  diclofenac Sodium (VOLTAREN) 1 % GEL Apply 2 g topically 4 (four) times daily as needed (for knee and back pain). 06/01/19   [provider]  diphenhydrAMINE (BENADRYL) 25 mg capsule Take 25 mg by mouth every 8 (eight) hours as needed for allergies or sleep.    [provider]  glucose blood (GLUCOSE METER TEST) test strip See admin instructions.    [provider]  Glucose Blood (PRODIGY VOICE BLOOD GLUCOSE VI)     [provider]  insulin aspart (NOVOLOG) 100 UNIT/ML FlexPen Inject 2-12 Units into the skin See admin instructions. Injects 2-10 units of insulin under the skin 3 times a day per sliding scale: CBG 0-150: 2 units; CBG 151-200: 4 units; CBG 201-250: 6 units; CBG 251-300: 8 units; CBG 301-350: 10 units; CBG 351-400: 12 units; CBG >400: 12 units and notify the MD/NP Patient taking differently: Inject  2-12 Units into the skin See admin instructions. Inject 2-10 units into the skin 3 times a day per sliding scale: CBG 0-150: 2 units; CBG 151-200: 4 units; CBG 201-250: 6 units; CBG 251-300: 8 units; CBG 301-350: 10 units; CBG 351-400: 12 units; CBG >400: 12 units and notify the MD/NP 07/08/20   Coletta Memos, Amy E, NP  insulin aspart (NOVOLOG) 100 UNIT/ML injection Inject 0-5 Units into the skin at bedtime. 11/01/22   Dahal, Melina Schools, MD  insulin aspart (NOVOLOG) 100 UNIT/ML injection Inject 0-6 Units into the skin 3 (three) times daily with meals. 11/01/22   Dahal, Melina Schools, MD  insulin glargine (LANTUS SOLOSTAR) 100 UNIT/ML Solostar Pen Inject 10 Units into the skin at bedtime. Patient taking differently: Inject 10 Units into the skin See admin instructions. Inject 10 units into the skin every afternoon- when eating 08/21/20   Marlin Canary U, DO  Insulin Pen Needle 32G X 4 MM MISC Used to inject insulin 3x daily Patient taking differently: 1 each by Other route See admin instructions. Used to inject insulin 3x daily 11/19/16   Romero Belling, MD  levothyroxine (SYNTHROID) 75 MCG tablet Take 1 tablet (75 mcg total) by mouth daily before breakfast. 07/08/20   Fargo, Amy E, NP  mupirocin ointment (BACTROBAN) 2 % Apply 1 Application topically 2 (two) times daily. 09/11/22   [provider]  pantoprazole (PROTONIX) 40 MG tablet Take 40 mg by mouth daily. 09/22/21   [provider]  PHENYTEK 300 MG ER capsule TAKE 1 CAPSULE AT BEDTIME Patient taking differently: Take 300 mg by mouth daily. 04/30/22   Windell Norfolk, MD  phenytoin (DILANTIN) 50 MG tablet Chew 50 mg by mouth See admin instructions. Takes along with 300 mg when needed for tremor.    [provider]  Polyethyl Glycol-Propyl Glycol (SYSTANE) 0.4-0.3 % SOLN Place 2 drops into both eyes 3 (three) times daily.    [provider]  polyethylene glycol (MIRALAX / GLYCOLAX) 17 g packet Take 17 g by mouth daily as needed for mild  constipation.    [provider]  primidone (MYSOLINE) 250 MG tablet Take 1 tablet (250 mg total) by mouth daily. 09/21/22 09/16/23  Windell Norfolk, MD  primidone (MYSOLINE) 50 MG tablet Take 3 tablets (150 mg total) by mouth at bedtime. 09/21/22 09/16/23  Windell Norfolk, MD  thiamine 250 MG tablet Take 250 mg by mouth daily.    [provider]  Verapamil HCl CR 200 MG CP24 Take 200 mg by mouth daily. 10/19/20  [provider]      Allergies    Bystolic [nebivolol hcl], Carbamazepine, Cholestyramine, Morphine, Niacin, Norvasc [amlodipine besylate], Optivar [azelastine hcl], Repaglinide, Sular [nisoldipine er], Telmisartan, Amoxicillin-pot clavulanate, Cefdinir, Ciprofloxacin hcl, Clonidine, Codeine, Ezetimibe, Hydroxychloroquine, Naproxen, Sulfa antibiotics, Ziac [bisoprolol-hydrochlorothiazide], Amlodipine besy-benazepril hcl, Azelastine, Elavil [amitriptyline], Empagliflozin, Hydralazine, Iodine i 131 tositumomab, Kenalog [triamcinolone], Keppra [levetiracetam], Lamotrigine, Pregabalin, Pseudoephedrine, Pseudoephedrine-guaifenesin er, Risedronate, Ru-hist d [brompheniramine-phenylephrine], Sumatriptan, Topiramate, Ace inhibitors, Aspirin, Atacand [candesartan], Bextra [valdecoxib], Bisoprolol-hydrochlorothiazide, Cefadroxil, Celecoxib, Hydrocodone, Hydrocodone-acetaminophen, Iodinated contrast media, Meloxicam, Methylprednisolone sodium succinate, Nabumetone, Penicillins, Pseudoephedrine-guaifenesin, Rofecoxib, Ru-tuss [chlorphen-pse-atrop-hyos-scop], Sulfonamide derivatives, Sulphur [elemental sulfur], Telithromycin, Terfenadine, Trandolapril-verapamil hcl er, and Valium [diazepam]    Review of Systems   Review of Systems  Physical Exam Updated Vital Signs BP (!) 146/96 (BP Location: Right Arm)   Pulse 80   Temp (!) 97.4 F (36.3 C) (Oral)   Resp 20   Ht 5\' 7"  (1.702 m)   Wt 65 kg   SpO2 100%   BMI 22.44 kg/m  Physical Exam Vitals and nursing note reviewed.  HENT:      Head: Atraumatic.  Cardiovascular:     Rate and Rhythm: Regular rhythm.  Chest:     Chest wall: No tenderness.  Abdominal:     Tenderness: There is no abdominal tenderness.  Musculoskeletal:        General: Tenderness present.     Cervical back: Neck supple.     Comments: Tenderness over left hip.  Left hip held flexed.  No knee tenderness.  No foot tenderness.  No lumbar tenderness.  Neurological:     Mental Status: She is alert and oriented to person, place, and time.     ED Results / Procedures / Treatments   Labs (all labs ordered are listed, but only abnormal results are displayed) Labs Reviewed  BASIC METABOLIC PANEL - Abnormal; Notable for the following components:      Result Value   Sodium 125 (*)    Chloride 94 (*)    CO2 21 (*)    Glucose, Bld 180 (*)    Calcium 8.5 (*)    All other components within normal limits  CBC WITH DIFFERENTIAL/PLATELET - Abnormal; Notable for the following components:   RBC 3.29 (*)    Hemoglobin 9.7 (*)    HCT 30.7 (*)    All other components within normal limits  PHENYTOIN LEVEL, TOTAL - Abnormal; Notable for the following components:   Phenytoin Lvl 6.2 (*)    All other components within normal limits  PROTIME-INR  TYPE AND SCREEN    EKG EKG Interpretation Date/Time:  Wednesday December 09 2022 13:03:39 EDT Ventricular Rate:  76 PR Interval:    QRS Duration:  89 QT Interval:  411 QTC Calculation: 460 R Axis:   -25  Text Interpretation: Atrial fibrillation Borderline left axis deviation Anteroseptal infarct, age indeterminate Minimal ST elevation, inferior leads Artifact in lead(s) I II III aVR aVL aVF Confirmed by Benjiman Core [phone]) on 12/09/2022 1:38:35 PM  Radiology DG Chest 1 View  Result Date: 12/09/2022 CLINICAL DATA:  Pain after fall EXAM: CHEST  1 VIEW COMPARISON:  12/21/2021 FINDINGS: No consolidation, pneumothorax or effusion. No edema. Normal cardiopericardial silhouette. Tortuous aorta. Overlapping  cardiac leads. Surgical clips in the right axillary region. IMPRESSION: No acute cardiopulmonary disease. Electronically Signed   By: Karen Kays M.D.   On: 12/09/2022 14:42   DG Hip Unilat With Pelvis 2-3 Views Left  Result Date: 12/09/2022 CLINICAL DATA:  Pain after fall EXAM:  DG HIP (WITH OR WITHOUT PELVIS) 3V LEFT COMPARISON:  X-ray 06/11/2022 FINDINGS: There is a minimally displaced subcapital femoral neck fracture of the left hip. Osteopenia. Right hemiarthroplasty identified. Expected alignment. The upper aspect of the pelvis is clipped off the edge of the film. IMPRESSION: Mm displaced subcapital femoral neck fracture of the left hip. Osteopenia. Right hip hemiarthroplasty Electronically Signed   By: Karen Kays M.D.   On: 12/09/2022 14:41    Procedures Procedures    Medications Ordered in ED Medications  0.9 %  sodium chloride infusion ( Intravenous New Bag/Given 12/09/22 1553)  oxyCODONE (Oxy IR/ROXICODONE) immediate release tablet 5 mg (has no administration in time range)  ondansetron (ZOFRAN) tablet 4 mg (has no administration in time range)    Or  ondansetron (ZOFRAN) injection 4 mg (has no administration in time range)  fentaNYL (SUBLIMAZE) injection 100 mcg (100 mcg Intravenous Given 12/09/22 1406)  fentaNYL (SUBLIMAZE) injection 50 mcg (50 mcg Intravenous Given 12/09/22 1554)    ED Course/ Medical Decision Making/ A&P                                 Medical Decision Making Amount and/or Complexity of Data Reviewed Labs: ordered. Radiology: ordered.  Risk Prescription drug management. Decision regarding hospitalization.   Patient with fall.  Likely left hip injury, fracture versus dislocation.  Did not hit head.  Will give pain medicine.  Will get x-rays.  Will get preop labs.  Has seen Dr. Linna Caprice in the past.  X-ray of the hip independently interpreted and does show left subcapital hip fracture.  Required repeated doses of pain medicines.  Lab work does show mild  anemia.  His hyponatremia of 125 which patient is had previously.  Will require admission to hospital.  Discussed with [facility].  Dr. Linna Caprice is not available but they will talk with other hip providers.  Discussed with Dr. Robb Matar from internal medicine for the admission and informed of the Ortho status.        Final Clinical Impression(s) / ED Diagnoses Final diagnoses:  Closed fracture of left hip, initial encounter (HCC)  Hyponatremia    Rx / DC Orders ED Discharge Orders     None         Benjiman Core, MD 12/09/22 1630

## 2022-12-09 NOTE — Progress Notes (Signed)
Patient ID: [name], female   DOB: December 10, 1929, 89 y.o.   MRN: [id number]  Patient with ground level fall Recent history of right THR for femoral neck fracture  Now with findings of left femoral neck fracture  We have asked to see if Dr Linna Caprice would have any available time Thursday or Friday Also will need to check on OR availability  Full note will follow based on [facility] decision otherwise we will be working to get this fracture addressed as soon as possible  Would keep her NPO after midnight in case OR is possible tomorrow.  As soon as we hear otherwise we will change her diet

## 2022-12-10 ENCOUNTER — Inpatient Hospital Stay (HOSPITAL_COMMUNITY): Payer: Medicare Other | Admitting: Anesthesiology

## 2022-12-10 ENCOUNTER — Inpatient Hospital Stay (HOSPITAL_COMMUNITY): Payer: Medicare Other

## 2022-12-10 ENCOUNTER — Encounter (HOSPITAL_COMMUNITY): Payer: Self-pay | Admitting: Internal Medicine

## 2022-12-10 DIAGNOSIS — S72002A Fracture of unspecified part of neck of left femur, initial encounter for closed fracture: Secondary | ICD-10-CM | POA: Diagnosis not present

## 2022-12-10 DIAGNOSIS — R9431 Abnormal electrocardiogram [ECG] [EKG]: Secondary | ICD-10-CM | POA: Diagnosis not present

## 2022-12-10 DIAGNOSIS — E871 Hypo-osmolality and hyponatremia: Secondary | ICD-10-CM

## 2022-12-10 DIAGNOSIS — E039 Hypothyroidism, unspecified: Secondary | ICD-10-CM

## 2022-12-10 LAB — GLUCOSE, CAPILLARY
Glucose-Capillary: 129 mg/dL — ABNORMAL HIGH (ref 70–99)
Glucose-Capillary: 98 mg/dL (ref 70–99)
Glucose-Capillary: 88 mg/dL (ref 70–99)
Glucose-Capillary: 117 mg/dL — ABNORMAL HIGH (ref 70–99)

## 2022-12-10 MED ORDER — CYANOCOBALAMIN 1000 MCG/ML IJ SOLN
1000.0000 ug | INTRAMUSCULAR | Status: DC
  Administered 2022-12-11: 1000 ug via INTRAMUSCULAR
  Filled 2022-12-10: qty 1

## 2022-12-10 MED ORDER — FENTANYL CITRATE PF 50 MCG/ML IJ SOSY: 50.0000 ug | PREFILLED_SYRINGE | INTRAMUSCULAR | Status: DC

## 2022-12-10 MED ORDER — FENTANYL CITRATE PF 50 MCG/ML IJ SOSY
PREFILLED_SYRINGE | INTRAMUSCULAR | Status: AC
  Administered 2022-12-10: 50 ug
  Filled 2022-12-10: qty 2

## 2022-12-10 MED ORDER — HYDROMORPHONE HCL 1 MG/ML IJ SOLN: 0.5000 mg | INTRAMUSCULAR | Status: DC | PRN

## 2022-12-10 MED ORDER — HYDROMORPHONE HCL 1 MG/ML IJ SOLN
1.0000 mg | Freq: Once | INTRAMUSCULAR | Status: AC
  Administered 2022-12-10: 1 mg via INTRAVENOUS

## 2022-12-10 MED ORDER — SODIUM CHLORIDE 0.9 % IV SOLN: INTRAVENOUS | Status: AC

## 2022-12-10 MED ORDER — CARMEX CLASSIC LIP BALM EX OINT
TOPICAL_OINTMENT | CUTANEOUS | Status: DC | PRN
  Filled 2022-12-10: qty 10

## 2022-12-10 MED ORDER — HYDROMORPHONE HCL 1 MG/ML IJ SOLN
1.0000 mg | INTRAMUSCULAR | Status: AC | PRN
  Administered 2022-12-10 – 2022-12-11 (×3): 1 mg via INTRAVENOUS
  Filled 2022-12-10 (×3): qty 1

## 2022-12-10 MED ORDER — BOOST / RESOURCE BREEZE PO LIQD CUSTOM: 1.0000 | Freq: Two times a day (BID) | ORAL | Status: DC

## 2022-12-10 MED ORDER — POLYETHYLENE GLYCOL 3350 17 G PO PACK
17.0000 g | PACK | Freq: Two times a day (BID) | ORAL | Status: DC
  Administered 2022-12-10 – 2022-12-11 (×3): 17 g via ORAL
  Filled 2022-12-10 (×4): qty 1

## 2022-12-10 MED ORDER — HYDROMORPHONE HCL 1 MG/ML IJ SOLN
INTRAMUSCULAR | Status: AC
  Filled 2022-12-10: qty 1

## 2022-12-10 MED ORDER — BUPIVACAINE-EPINEPHRINE (PF) 0.5% -1:200000 IJ SOLN
INTRAMUSCULAR | Status: DC | PRN
  Administered 2022-12-10: 30 mL via PERINEURAL

## 2022-12-10 MED ORDER — MELATONIN 5 MG PO TABS
5.0000 mg | ORAL_TABLET | Freq: Every evening | ORAL | Status: DC | PRN
  Administered 2022-12-10 – 2022-12-12 (×3): 5 mg via ORAL
  Filled 2022-12-10 (×3): qty 1

## 2022-12-10 NOTE — Anesthesia Preprocedure Evaluation (Addendum)
Anesthesia Evaluation  Patient identified by MRN, date of birth, ID band Patient awake    Reviewed: Allergy & Precautions, Patient's Chart, lab work & pertinent test results  History of Anesthesia Complications Negative for: history of anesthetic complications  Airway        Dental   Pulmonary neg pulmonary ROS          Cardiovascular hypertension,      Neuro/Psych Seizures -,   Anxiety        GI/Hepatic Neg liver ROS,GERD  ,,  Endo/Other  diabetes, Type 2Hypothyroidism    Renal/GU Na 127  negative genitourinary   Musculoskeletal  (+) Arthritis ,  subcapital femoral neck fracture of left hip   Abdominal   Peds  Hematology  (+) Blood dyscrasia (Hgb 9.9), anemia   Anesthesia Other Findings Day of surgery medications reviewed with patient.  Reproductive/Obstetrics                             Anesthesia Physical Anesthesia Plan  ASA: 2  Anesthesia Plan: Regional   Post-op Pain Management:    Induction:   PONV Risk Score and Plan: Treatment [name] vary due to age or medical condition  Airway Management Planned: Natural Airway  Additional Equipment: None  Intra-op Plan:   Post-operative Plan:   Informed Consent: I have reviewed the patients History and Physical, chart, labs and discussed the procedure including the risks, benefits and alternatives for the proposed anesthesia with the patient or authorized representative who has indicated his/her understanding and acceptance.       Plan Discussed with:   Anesthesia Plan Comments:        Anesthesia Quick Evaluation

## 2022-12-10 NOTE — H&P (View-Only) (Signed)
 Reason for Consult: left hip fracture Referring Physician: David Stall, MD  [name] is an 89 y.o. female.  HPI: Patient presents after a fall. Was at home. Reportedly lost her balance attempt to put a shirt on. Now severe left hip pain. Cannot really move left hand. Had fentanyl by EMS. Did not hit head. Not on blood thinners. No neck pain. Previously had a right hip replaced by Dr. Linna Caprice and would like to see him again if possible.   Having a lot of pain  Past Medical History:  Diagnosis Date   Anemia    Anxiety    Asthma    Breast cancer (HCC) 2014   right breast   Cancer of right breast (HCC) 12/26/2012   right breast 12:00 o'clock, DCIS   Carotid artery disease (HCC)    Carpal tunnel syndrome, bilateral    Chronic bronchitis (HCC)    Chronic cough    Chronic diarrhea    3 years   Chronic facial pain    Chronic foot pain    Colon cancer (HCC) dx'd 11/2019   Complication of anesthesia    Sore jaw; could not chew or move mouth, prolonged sedation   Convulsions/seizures (HCC) 10/16/2014   Diabetes mellitus    type 2 niddm x 20 years   Dyslipidemia    Ejection fraction    Gait abnormality 12/04/2019   GERD (gastroesophageal reflux disease)    Hammer toe    bilateral   History of colonic polyps    History of meningioma    HTN (hypertension)    Hx of radiation therapy 03/07/13- 03/29/13   right breast 4250 cGy 17 sessions   Hyperlipidemia    Hypokalemia    Hyponatremia    Hypothyroidism    IBS (irritable bowel syndrome)    Melanoma (HCC)    Metatarsal bone fracture 2014   Multiple drug allergies    Nausea & vomiting 03/12/2020   Nontoxic thyroid nodule    Obesity    Osteoarthritis    Osteoporosis    Palpitations    Personal history of radiation therapy 2014   Seasonal allergies    Skin cancer    Syncope    Tremor, essential 08/18/2016   Vitamin B12 deficiency    Vitamin D deficiency     Past Surgical History:  Procedure Laterality Date   ABDOMINAL  HYSTERECTOMY     BIOPSY  11/07/2019   Procedure: BIOPSY;  Surgeon: Bernette Redbird, MD;  Location: [facility];  Service: Endoscopy;;   BRAIN SURGERY     BREAST BIOPSY Right 01/24/2013   Procedure: RE-EXCICION OF BREAST CANCER, ANTERIOR MARGINS;  Surgeon: Mariella Saa, MD;  Location: [facility];  Service: General;  Laterality: Right;   BREAST LUMPECTOMY Right 2014   BREAST LUMPECTOMY WITH NEEDLE LOCALIZATION Right 01/17/2013   Procedure: BREAST LUMPECTOMY WITH NEEDLE LOCALIZATION;  Surgeon: Mariella Saa, MD;  Location: [facility];  Service: General;  Laterality: Right;   BUNIONECTOMY Bilateral    CATARACT EXTRACTION W/ INTRAOCULAR LENS IMPLANT Right    CHOLECYSTECTOMY     COLONOSCOPY     COLONOSCOPY WITH PROPOFOL N/A 11/07/2019   Procedure: COLONOSCOPY WITH PROPOFOL;  Surgeon: Bernette Redbird, MD;  Location: [facility];  Service: Endoscopy;  Laterality: N/A;   CRANIOTOMY Right 10/18/2013   Procedure: CRANIOTOMY TUMOR EXCISION;  Surgeon: Karn Cassis, MD;  Location: [facility];  Service: Neurosurgery;  Laterality: Right;   ESOPHAGOGASTRODUODENOSCOPY (EGD) WITH PROPOFOL N/A 11/07/2019   Procedure: ESOPHAGOGASTRODUODENOSCOPY (  EGD) WITH PROPOFOL;  Surgeon: Bernette Redbird, MD;  Location: [facility];  Service: Endoscopy;  Laterality: N/A;   EYE SURGERY     HERNIA REPAIR     IR THORACENTESIS ASP PLEURAL SPACE W/IMG GUIDE  04/01/2020   KNEE ARTHROSCOPY Bilateral    LAPAROSCOPIC RIGHT HEMI COLECTOMY Right 03/06/2020   Procedure: LAPAROSCOPIC RIGHT HEMI COLECTOMY WITH TAP BLOCK AND LYSIS OF ADHESIONS;  Surgeon: Andria Meuse, MD;  Location: [facility];  Service: General;  Laterality: Right;   POLYPECTOMY     small adenomatous   TOTAL HIP ARTHROPLASTY Right 06/05/2020   Procedure: ARTHROPLASTY  ANTERIOR APPROACH. RIGHT HIP;  Surgeon: Samson Frederic, MD;  Location: [facility];  Service: Orthopedics;  Laterality: Right;   ULNAR TUNNEL RELEASE      Family History  Problem Relation Age of  Onset   Heart disease Mother    Osteoporosis Mother    Diabetes Father    Pancreatic cancer Father    Bone cancer Sister    Rectal cancer Sister    Thyroid disease Sister        benign goiter resected   Prostate cancer Brother    Colon cancer Brother    Colon cancer Other     Social History:  reports that she has never smoked. She has never used smokeless tobacco. She reports that she does not drink alcohol and does not use drugs.  Allergies:  Allergies  Allergen Reactions   Bystolic [Nebivolol Hcl] Other (See Comments)    "extreme weakness, heaviness in legs & arms, swelling in legs/arms/face, swollen abdomen, pain in bladder, feet pain, soreness in chest"   Carbamazepine Other (See Comments)    Blood poisoning   Cholestyramine Itching, Nausea And Vomiting, Rash and Other (See Comments)    "itching rash on stomach, bloated, nausea, vomiting, sleeplessness, extreme pain in arms"  Patient is taking this in 2022.   Morphine Other (See Comments)    Feels morbid, weak, still in pain   Niacin Palpitations    Fast heart beat Other reaction(s): Unknown   Norvasc [Amlodipine Besylate] Other (See Comments)    "extreme fluid retention/pain)   Optivar [Azelastine Hcl] Photosensitivity   Repaglinide Hives   Sular [Nisoldipine Er] Other (See Comments)    "severe headaches, swelling eyes, hands, feet, shortness of breath, weak, flushed face, brain boiling, fluid retention, high blood sugar, nervous, heart fast beating"   Telmisartan Other (See Comments)    "headache, difficulty urinating, high blood sugar, fluid retention"   Amoxicillin-Pot Clavulanate Rash    Tolerates Unasyn (>10 doses)   Cefdinir Swelling    Vaginal irritation, breathing,    Ciprofloxacin Hcl Hives   Clonidine Other (See Comments)    Dry mouth, fluid retention   Codeine Nausea And Vomiting   Ezetimibe Other (See Comments)    Made weak   Hydroxychloroquine Other (See Comments)    Low platlets   Naproxen Other  (See Comments)    Shrinks bladder   Sulfa Antibiotics Rash   Ziac [Bisoprolol-Hydrochlorothiazide] Other (See Comments)    "stopped urination"   Amlodipine Besy-Benazepril Hcl Cough   Azelastine Other (See Comments)    Unknown reaction - Per MAR   Elavil [Amitriptyline] Other (See Comments)    Gave Pt nightmares   Empagliflozin Other (See Comments)    "Caused yeast infection, slowed my urine"   Hydralazine Other (See Comments)    "does not reduce high blood pressure, pain in arm, high pressure, felt like I was on verge  of heart attack, really weak"   Iodine I 131 Tositumomab Other (See Comments)    Unknown reaction - Per MAR   Kenalog [Triamcinolone] Diarrhea    Per MAR   Keppra [Levetiracetam] Other (See Comments)    Shaking   Lamotrigine Itching and Other (See Comments)   Pregabalin Swelling and Other (See Comments)    Weight gain   Pseudoephedrine Other (See Comments)    Unknown reaction   Pseudoephedrine-Guaifenesin Er Other (See Comments)    Unknown reaction - Per MAR   Risedronate     Unknown reaction - Per MAR   Ru-Hist D [Brompheniramine-Phenylephrine] Other (See Comments)    Unknown reaction - Per MAR   Sumatriptan Other (See Comments)   Topiramate Other (See Comments)    Dry eyes   Ace Inhibitors Other (See Comments)    unknown   Aspirin Other (See Comments)    Unknown reaction   Atacand [Candesartan] Other (See Comments)    Unknown reaction - MAR   Bextra [Valdecoxib] Other (See Comments)    Unknown reaction - Per MAR   Bisoprolol-Hydrochlorothiazide Other (See Comments)    Unknown reaction - Per MAR   Cefadroxil Other (See Comments)    Unknown reaction   Celecoxib Rash   Hydrocodone Other (See Comments)    unknown   Hydrocodone-Acetaminophen Other (See Comments)    unknown   Iodinated Contrast Media Rash and Other (See Comments)    "All over" rash   Meloxicam Other (See Comments)    unknown   Methylprednisolone Sodium Succinate Other (See Comments)     unknown   Nabumetone Other (See Comments)    Unknown reaction   Penicillins Other (See Comments)    unknown   Pseudoephedrine-Guaifenesin Other (See Comments)    unknown   Rofecoxib Other (See Comments)    Unknown reaction - MAR Other reaction(s): Unknown   Ru-Tuss [Chlorphen-Pse-Atrop-Hyos-Scop] Other (See Comments)    unknown   Sulfonamide Derivatives Other (See Comments)    unknown   Sulphur [Elemental Sulfur] Other (See Comments)    unknown   Telithromycin Other (See Comments)    unknown   Terfenadine Other (See Comments)    Unknown reaction - Per MAR   Trandolapril-Verapamil Hcl Er Other (See Comments)    Headache, difficulty urinating, high blood sugar, fluid retention  Pt is taking Tarka (trandolapril-verapamil) currently, but requests the medication stay in her allergy list   Valium [Diazepam] Other (See Comments)    Makes her mean and hyper    Medications: I have reviewed the patient's current medications. Scheduled:  cholestyramine light  4 g Oral BID   cyanocobalamin  1,000 mcg Intramuscular Q21 days   insulin aspart  0-9 Units Subcutaneous TID WC   insulin glargine-yfgn  10 Units Subcutaneous QHS   levothyroxine  75 mcg Oral QAC breakfast   pantoprazole  40 mg Oral Daily   phenytoin  50 mg Oral See admin instructions   phenytoin  300 mg Oral QHS   primidone  150 mg Oral QHS   primidone  250 mg Oral Daily   sodium chloride  2 g Oral BID WC   verapamil  180 mg Oral Daily    Results for orders placed or performed during the hospital encounter of 12/09/22 (from the past 24 hour(s))  Basic metabolic panel     Status: Abnormal   Collection Time: 12/09/22  1:19 PM  Result Value Ref Range   Sodium 125 (L) 135 - 145 mmol/L  Potassium 4.4 3.5 - 5.1 mmol/L   Chloride 94 (L) 98 - 111 mmol/L   CO2 21 (L) 22 - 32 mmol/L   Glucose, Bld 180 (H) 70 - 99 mg/dL   BUN 13 8 - 23 mg/dL   Creatinine, Ser 1.61 0.44 - 1.00 mg/dL   Calcium 8.5 (L) 8.9 - 10.3 mg/dL    GFR, Estimated >09 >60 mL/min   Anion gap 10 5 - 15  CBC with Differential     Status: Abnormal   Collection Time: 12/09/22  1:19 PM  Result Value Ref Range   WBC 8.3 4.0 - 10.5 K/uL   RBC 3.29 (L) 3.87 - 5.11 MIL/uL   Hemoglobin 9.7 (L) 12.0 - 15.0 g/dL   HCT 45.4 (L) 09.8 - 11.9 %   MCV 93.3 80.0 - 100.0 fL   MCH 29.5 26.0 - 34.0 pg   MCHC 31.6 30.0 - 36.0 g/dL   RDW 14.7 82.9 - 56.2 %   Platelets 293 150 - 400 K/uL   nRBC 0.0 0.0 - 0.2 %   Neutrophils Relative % 78 %   Neutro Abs 6.6 1.7 - 7.7 K/uL   Lymphocytes Relative 14 %   Lymphs Abs 1.1 0.7 - 4.0 K/uL   Monocytes Relative 6 %   Monocytes Absolute 0.5 0.1 - 1.0 K/uL   Eosinophils Relative 1 %   Eosinophils Absolute 0.0 0.0 - 0.5 K/uL   Basophils Relative 0 %   Basophils Absolute 0.0 0.0 - 0.1 K/uL   Immature Granulocytes 1 %   Abs Immature Granulocytes 0.05 0.00 - 0.07 K/uL  Protime-INR     Status: None   Collection Time: 12/09/22  1:19 PM  Result Value Ref Range   Prothrombin Time 13.6 11.4 - 15.2 seconds   INR 1.0 0.8 - 1.2  Type and screen [facility]     Status: None   Collection Time: 12/09/22  1:19 PM  Result Value Ref Range   ABO/RH(D) A POS    Antibody Screen NEG    Sample Expiration      12/12/2022,2359 Performed at [facility], [address], [place], [place] [address]   Phenytoin level, total     Status: Abnormal   Collection Time: 12/09/22  1:19 PM  Result Value Ref Range   Phenytoin Lvl 6.2 (L) 10.0 - 20.0 ug/mL  Glucose, capillary     Status: Abnormal   Collection Time: 12/09/22  9:45 PM  Result Value Ref Range   Glucose-Capillary 219 (H) 70 - 99 mg/dL  CBC     Status: Abnormal   Collection Time: 12/10/22  4:13 AM  Result Value Ref Range   WBC 10.1 4.0 - 10.5 K/uL   RBC 3.34 (L) 3.87 - 5.11 MIL/uL   Hemoglobin 9.9 (L) 12.0 - 15.0 g/dL   HCT 57.8 (L) 46.9 - 62.9 %   MCV 92.2 80.0 - 100.0 fL   MCH 29.6 26.0 - 34.0 pg   MCHC 32.1 30.0 - 36.0 g/dL    RDW 52.8 41.3 - 24.4 %   Platelets 315 150 - 400 K/uL   nRBC 0.0 0.0 - 0.2 %  Comprehensive metabolic panel     Status: Abnormal   Collection Time: 12/10/22  4:13 AM  Result Value Ref Range   Sodium 127 (L) 135 - 145 mmol/L   Potassium 3.7 3.5 - 5.1 mmol/L   Chloride 98 98 - 111 mmol/L   CO2 19 (L) 22 - 32 mmol/L  Glucose, Bld 146 (H) 70 - 99 mg/dL   BUN 9 8 - 23 mg/dL   Creatinine, Ser 3.24 0.44 - 1.00 mg/dL   Calcium 7.6 (L) 8.9 - 10.3 mg/dL   Total Protein 5.2 (L) 6.5 - 8.1 g/dL   Albumin 2.6 (L) 3.5 - 5.0 g/dL   AST 23 15 - 41 U/L   ALT 14 0 - 44 U/L   Alkaline Phosphatase 75 38 - 126 U/L   Total Bilirubin 0.3 0.3 - 1.2 mg/dL   GFR, Estimated >40 >10 mL/min   Anion gap 10 5 - 15   *Note: Due to a large number of results and/or encounters for the requested time period, some results have not been displayed. A complete set of results can be found in Results Review.     X-ray: CLINICAL DATA:  Pain after fall   EXAM: DG HIP (WITH OR WITHOUT PELVIS) 3V LEFT   COMPARISON:  X-ray 06/11/2022   FINDINGS: There is a minimally displaced subcapital femoral neck fracture of the left hip. Osteopenia. Right hemiarthroplasty identified. Expected alignment. The upper aspect of the pelvis is clipped off the edge of the film.   IMPRESSION: Mm displaced subcapital femoral neck fracture of the left hip. Osteopenia.   Right hip hemiarthroplasty     Electronically Signed   By: Karen Kays M.D.  ROS: As per HPI  Blood pressure 132/62, pulse 69, temperature 98 F (36.7 C), temperature source Oral, resp. rate (!) 24, height 5\' 7"  (1.702 m), weight 65 kg, SpO2 99%.  Physical Exam: Vitals and nursing note reviewed.  HENT:     Head: Atraumatic.  Cardiovascular:     Rate and Rhythm: Regular rhythm.  Chest:     Chest wall: No tenderness.  Abdominal:     Tenderness: There is no abdominal tenderness.  Musculoskeletal:        General: Tenderness present.     Cervical back:  Neck supple.     Comments: Tenderness over left hip.  LLE slightly shortened and externally rotated.  Neurological:     Mental Status: She is alert and oriented to person, place, and time.   Assessment/Plan: Left hip femoral neck fracture  Plan: She will need a THR to address her femoral neck fracture NPO for now as we try and find time in the OR today versus tomorrow I will also be looking for help from [facility] who replaced her right hip when she broke her hip a couple of years ago  Shelda Pal 12/10/2022, 7:04 AM

## 2022-12-10 NOTE — TOC Initial Note (Addendum)
Transition of Care [facility]) - Initial/Assessment Note    Patient Details  Name: [name] MRN: [id number] Date of Birth: 05-10-1929  [facility] CM/SW Contact:    Howell Rucks, RN Phone Number: 12/10/2022, 8:31 AM  Clinical Narrative:   [facility] consult for SNF placement. Admit from home s/p fall with left hip fx,  plan from left hip hemiarthroplasty-pending OR availability.   TOC will continue to follow.                     Patient Goals and CMS Choice            Expected Discharge Plan and Services                                              Prior Living Arrangements/Services                       Activities of Daily Living Home Assistive Devices/Equipment: Cane (specify quad or straight) ADL Screening (condition at time of admission) Patient's cognitive ability adequate to safely complete daily activities?: Yes Is the patient deaf or have difficulty hearing?: No Does the patient have difficulty seeing, even when wearing glasses/contacts?: No Does the patient have difficulty concentrating, remembering, or making decisions?: No Patient able to express need for assistance with ADLs?: No Does the patient have difficulty dressing or bathing?: No Independently performs ADLs?: No Communication: Needs assistance Is this a change from baseline?: Change from baseline, expected to last <3 days Dressing (OT): Needs assistance Is this a change from baseline?: Change from baseline, expected to last <3days Grooming: Needs assistance Is this a change from baseline?: Change from baseline, expected to last <3 days Feeding: Independent Bathing: Needs assistance Is this a change from baseline?: Change from baseline, expected to last <3 days Toileting: Needs assistance Is this a change from baseline?: Change from baseline, expected to last <3 days In/Out Bed: Needs assistance Is this a change from baseline?: Change from baseline, expected to last >3  days Walks in Home: Needs assistance Is this a change from baseline?: Change from baseline, expected to last <3 days Does the patient have difficulty walking or climbing stairs?: Yes Weakness of Legs: Left Weakness of Arms/Hands: None  Permission Sought/Granted                  Emotional Assessment              Admission diagnosis:  Hyponatremia [E87.1] Closed fracture of left hip, initial encounter (HCC) [S72.002A] Closed left hip fracture, initial encounter (HCC) [S72.002A] Patient Active Problem List   Diagnosis Date Noted   Closed left hip fracture, initial encounter (HCC) 12/09/2022   Generalized weakness 10/28/2022   Left hand pain 10/28/2022   SBO (small bowel obstruction) (HCC) 01/24/2022   Osteopenia 10/06/2021   Family history of malignant neoplasm of digestive organs 10/06/2021   History of malignant neoplasm of colon 10/06/2021   Hx of compression fracture of spine 10/06/2021   Intestinal malabsorption 10/06/2021   Pharyngeal dysphagia 10/06/2021   Carpal tunnel syndrome of left wrist 09/22/2021   CAP (community acquired pneumonia) 04/03/2021   Numbness of hand 01/13/2021   DDD (degenerative disc disease), cervical 12/03/2020   Compression fracture of body of thoracic vertebra (HCC) 08/21/2020   Dilantin toxicity 08/17/2020   Fracture  of neck of femur (HCC) 07/11/2020   Postoperative anemia due to acute blood loss 06/06/2020   Leukocytosis    Closed right hip fracture (HCC) 06/04/2020   Constipation 05/27/2020   Dyslipidemia 05/27/2020   Enterocolitis due to Clostridium difficile, not specified as recurrent 05/27/2020   Pleural effusion on right 04/01/2020   Acute respiratory failure with hypoxia (HCC) 04/01/2020   Pneumonia of right lung due to infectious organism 04/01/2020   Uncontrolled type 2 diabetes mellitus with hyperglycemia, with long-term current use of insulin (HCC) 04/01/2020   Abnormal CT of the chest 04/01/2020   Subacute pulmonary  embolism (HCC) 04/01/2020   Acute metabolic encephalopathy 04/01/2020   Adenocarcinoma of colon (HCC) 04/01/2020   Nausea & vomiting 03/12/2020   Cancer of ascending colon pT3pN0 (0/12 LN) s/p lap right colectomy 03/06/2020 03/09/2020   History of multiple allergies 03/09/2020   S/P right hemicolectomy 03/06/2020   Closed fracture of shaft of tibia 02/12/2020   Abnormal feces 12/11/2019   Preop cardiovascular exam 12/11/2019   Gait abnormality 12/04/2019   Impaired left ventricular function 09/05/2019   Pain in joint of left shoulder 02/27/2019   Hypokalemia    Effusion of left knee joint    Hyponatremia    Left patella fracture 10/26/2018   Fracture of patella 10/26/2018   Pain in left knee 09/29/2018   Acquired hypothyroidism 08/25/2017   H/O excision of tumor of brain meninges 08/25/2017   Hammer toe 08/25/2017   History of breast cancer 08/25/2017   Nontoxic thyroid nodule 08/25/2017   Osteopetrosis 08/25/2017   Age-related osteoporosis without current pathological fracture 08/25/2017   Dysuria 01/19/2017   Vitamin D deficiency 11/18/2016   Tremor, essential 08/18/2016   Closed burst fracture of lumbar vertebra (HCC) 11/25/2015   Seizure disorder (HCC) 10/16/2014   Chronic low back pain 12/21/2013   Cerebral meningioma (HCC) 12/21/2013   Brain tumor (benign) (HCC) 10/18/2013   Subdural hemorrhage (HCC) 10/09/2013   Anxiety 10/09/2013   Compression fracture of T12 vertebra (HCC) 10/09/2013   Nontoxic multinodular goiter 09/05/2013   Syncope 08/27/2013   Carotid artery disease (HCC) 08/27/2013   Abnormal thyroid ultrasound 08/27/2013   Cancer of central portion of female breast (HCC) 12/30/2012   Osteoporosis 03/06/2010   ASTHMA 03/05/2009   Asthma 03/05/2009   IRRITABLE BOWEL SYNDROME  diarrhea type 03/21/2008   ANEMIA-NOS 12/16/2006   GERD 12/16/2006   Insulin-requiring or dependent type II diabetes mellitus (HCC) 10/29/2006   Mixed hyperlipidemia 10/29/2006    Essential hypertension 10/29/2006   Allergic rhinitis, cause unspecified 10/29/2006   OSTEOARTHRITIS 10/29/2006   PCP:  Verlon Au, MD Pharmacy:  No Pharmacies Listed    Social Determinants of Health (SDOH) Social History: SDOH Screenings   Food Insecurity: No Food Insecurity (12/09/2022)  Housing: Low Risk  (12/09/2022)  Transportation Needs: No Transportation Needs (12/09/2022)  Utilities: Not At Risk (12/09/2022)  Depression (PHQ2-9): Low Risk  (09/18/2020)  Social Connections: Unknown (09/16/2021)   [facility] from [facility]  Tobacco Use: Low Risk  (12/10/2022)   SDOH Interventions:     Readmission Risk Interventions    10/27/2022   12:30 PM 01/29/2022   10:17 AM 06/06/2020    2:30 PM  Readmission Risk Prevention Plan  Transportation Screening Complete Complete Complete  PCP or Specialist Appt within 3-5 Days Complete Complete   HRI or Home Care Consult Complete Complete   Social Work Consult for Recovery Care Planning/Counseling Complete Complete   Palliative Care Screening  Complete  Medication Review Oceanographer) Complete Complete   PCP or Specialist appointment within 3-5 days of discharge   Complete  SW Recovery Care/Counseling Consult   Complete  Palliative Care Screening   Not Applicable

## 2022-12-10 NOTE — Progress Notes (Signed)
[facility] PROGRESS NOTE    Progress Note  [name]  GEX:[id number] DOB: 1929-12-09 DOA: 12/09/2022 PCP: Verlon Au, MD     Brief Narrative:   [name] is an 89 y.o. female past medical history significant for unspecified anemia, asthma/chronic bronchitis, right breast cancer history of radiation, carotid artery stenosis, diabetes mellitus type 2, brought into the ED via EMS after a fall, hemoglobin was 9 white blood cell count 8.3 chest x-ray showed no acute findings she was complaining of left hip pain and imaging showed minimally displaced subcapital femoral neck fracture with repeat surgery was consulted   Assessment/Plan:   Closed left hip fracture, initial encounter (HCC) Buck traction per protocol. Continue analgesics and antiemetics. PT OT eval post surgical procedure. Orthopedic surgery was consulted who recommended THR, hopefully today, currently n.p.o. started on a bowel regiment.  Hyponatremia: Secondary to chronic diarrhea, on sodium tablets and cholestyramine. Improving with IV fluids. She usually ranges from 1 26-1 31. The chloride is low so hypovolemia likely contributing to it.  Uncontrolled type 2 diabetes mellitus with hyperglycemia, with long-term current use  of insulin (HCC) Currently n.p.o., continue CBGs Q4.  Continue sliding scale. Blood glucose fairly controlled.  Mixed hyperlipidemia Continue cholestyramine.  ANEMIA-NOS Hemoglobin like around 9-10 that is her baseline unchanged.  Essential hypertension Continue verapamil.  Essential tremors: Continue primidone.  GERD: Continue Protonix.  Seizure disorder (HCC) Continue phenytoin  Acquired hypothyroidism Continue Synthroid  Hyponatremia Continue inhalers.   DVT prophylaxis: Lovenox Family Communication:none Status is: Inpatient Remains inpatient appropriate because: Acute closed hip fracture    Code Status:     Code Status Orders  (From admission,  onward)           Start     Ordered   12/09/22 1549  Full code  Continuous       Question:  By:  Answer:  Consent: discussion documented in EHR   12/09/22 1552           Code Status History     Date Active Date Inactive Code Status Order ID Comments User Context   12/09/2022 1552 12/09/2022 1552 Full Code [id number]  Bobette Mo, MD ED   10/24/2022 1415 11/01/2022 2012 Full Code [id number]  Leatha Gilding, MD ED   01/24/2022 0245 01/29/2022 1730 Full Code [id number]  Briscoe Deutscher, MD ED   12/21/2021 1446 12/22/2021 2154 Full Code [id number]  Belva Agee, MD ED   04/03/2021 1415 04/08/2021 1737 Full Code [id number]  Darlin Drop, DO ED   08/17/2020 0338 08/22/2020 2001 Full Code [id number]  Eduard Clos, MD ED   06/04/2020 1322 06/11/2020 0538 Full Code [id number]  Vassie Loll, MD ED   04/01/2020 0654 04/02/2020 1735 Full Code [id number]  Shalhoub, Deno Lunger, MD ED   03/12/2020 2239 03/21/2020 1909 Full Code [id number]  Gaynelle Adu, MD ED   03/06/2020 1451 03/12/2020 1720 Full Code [id number]  Andria Meuse, MD Inpatient   10/26/2018 0153 10/29/2018 1938 Full Code [id number]  Hugelmeyer, [place], DO Inpatient   07/19/2014 1107 07/20/2014 0335 Full Code [id number]  Oley Balm, MD HOV   10/18/2013 1942 10/27/2013 2023 Full Code [id number]  Karn Cassis, MD Inpatient   10/09/2013 2217 10/18/2013 1942 Full Code [id number]  Rama, Maryruth Bun, MD Inpatient         IV Access:   Peripheral IV   Procedures and diagnostic studies:   DG Chest 1 View  Result Date: 12/09/2022 CLINICAL DATA:  Pain after fall EXAM: CHEST  1 VIEW COMPARISON:  12/21/2021 FINDINGS: No consolidation, pneumothorax or effusion. No edema. Normal cardiopericardial silhouette. Tortuous aorta. Overlapping cardiac leads. Surgical clips in the right axillary region. IMPRESSION: No acute cardiopulmonary disease. Electronically Signed   By: Karen Kays M.D.   On: 12/09/2022 14:42   DG Hip Unilat With  Pelvis 2-3 Views Left  Result Date: 12/09/2022 CLINICAL DATA:  Pain after fall EXAM: DG HIP (WITH OR WITHOUT PELVIS) 3V LEFT COMPARISON:  X-ray 06/11/2022 FINDINGS: There is a minimally displaced subcapital femoral neck fracture of the left hip. Osteopenia. Right hemiarthroplasty identified. Expected alignment. The upper aspect of the pelvis is clipped off the edge of the film. IMPRESSION: Mm displaced subcapital femoral neck fracture of the left hip. Osteopenia. Right hip hemiarthroplasty Electronically Signed   By: Karen Kays M.D.   On: 12/09/2022 14:41     Medical Consultants:   None.   Subjective:    [name] relates she still in pain.  Objective:    Vitals:   12/09/22 1825 12/09/22 2038 12/10/22 0028 12/10/22 0510  BP: (!) 158/70 (!) 160/95 (!) 150/84 132/62  Pulse: 79 87 86 69  Resp: (!) 22  (!) 24   Temp:  98.5 F (36.9 C) 98.1 F (36.7 C) 98 F (36.7 C)  TempSrc:   Oral Oral  SpO2: 100% 100% 99% 99%  Weight:      Height:       SpO2: 99 %   Intake/Output Summary (Last 24 hours) at 12/10/2022 0753 Last data filed at 12/10/2022 0409 Gross per 24 hour  Intake 1159.49 ml  Output --  Net 1159.49 ml   Filed Weights   12/09/22 1306  Weight: 65 kg    Exam: General exam: In no acute distress. Respiratory system: Good air movement and clear to auscultation. Cardiovascular system: S1 & S2 heard, RRR. No JVD. Gastrointestinal system: Abdomen is nondistended, soft and nontender.  Extremities: No pedal edema. Skin: No rashes, lesions or ulcers Psychiatry: Judgement and insight appear normal.    Data Reviewed:    Labs: Basic Metabolic Panel: Recent Labs  Lab 12/09/22 1319 12/10/22 0413  NA 125* 127*  K 4.4 3.7  CL 94* 98  CO2 21* 19*  GLUCOSE 180* 146*  BUN 13 9  CREATININE 0.60 0.53  CALCIUM 8.5* 7.6*   GFR Estimated Creatinine Clearance: 45.5 mL/min (by C-G formula based on SCr of 0.53 mg/dL). Liver Function Tests: Recent Labs  Lab 12/10/22 0413   AST 23  ALT 14  ALKPHOS 75  BILITOT 0.3  PROT 5.2*  ALBUMIN 2.6*   No results for input(s): "LIPASE", "AMYLASE" in the last 168 hours. No results for input(s): "AMMONIA" in the last 168 hours. Coagulation profile Recent Labs  Lab 12/09/22 1319  INR 1.0   COVID-19 Labs  No results for input(s): "DDIMER", "FERRITIN", "LDH", "CRP" in the last 72 hours.  Lab Results  Component Value Date   SARSCOV2NAA NEGATIVE 04/07/2021   SARSCOV2NAA NEGATIVE 04/03/2021   SARSCOV2NAA NEGATIVE 03/30/2021   SARSCOV2NAA NEGATIVE 08/17/2020    CBC: Recent Labs  Lab 12/09/22 1319 12/10/22 0413  WBC 8.3 10.1  NEUTROABS 6.6  --   HGB 9.7* 9.9*  HCT 30.7* 30.8*  MCV 93.3 92.2  PLT 293 315   Cardiac Enzymes: No results for input(s): "CKTOTAL", "CKMB", "CKMBINDEX", "TROPONINI" in the last 168 hours. BNP (last 3 results) No results for input(s): "PROBNP" in the last 8760 hours. CBG: Recent Labs  Lab 12/09/22 2145  GLUCAP 219*   D-Dimer: No results for input(s): "DDIMER" in the last 72 hours. Hgb A1c: No results for input(s): "HGBA1C" in the last 72 hours. Lipid Profile: No results for input(s): "CHOL", "HDL", "LDLCALC", "TRIG", "CHOLHDL", "LDLDIRECT" in the last 72 hours. Thyroid function studies: No results for input(s): "TSH", "T4TOTAL", "T3FREE", "THYROIDAB" in the last 72 hours.  Invalid input(s): "FREET3" Anemia work up: No results for input(s): "VITAMINB12", "FOLATE", "FERRITIN", "TIBC", "IRON", "RETICCTPCT" in the last 72 hours. Sepsis Labs: Recent Labs  Lab 12/09/22 1319 12/10/22 0413  WBC 8.3 10.1   Microbiology No results found for this or any previous visit (from the past 240 hour(s)).   Medications:    cholestyramine light  4 g Oral BID   cyanocobalamin  1,000 mcg Intramuscular Q21 days   insulin aspart  0-9 Units Subcutaneous TID WC   insulin glargine-yfgn  10 Units Subcutaneous QHS   levothyroxine  75 mcg Oral QAC breakfast   pantoprazole  40 mg Oral  Daily   phenytoin  50 mg Oral See admin instructions   phenytoin  300 mg Oral QHS   primidone  150 mg Oral QHS   primidone  250 mg Oral Daily   sodium chloride  2 g Oral BID WC   verapamil  180 mg Oral Daily   Continuous Infusions:  sodium chloride 75 mL/hr at 12/10/22 0409      LOS: 1 day   Marinda Elk  [facility]  12/10/2022, 7:53 AM

## 2022-12-10 NOTE — Plan of Care (Signed)

## 2022-12-10 NOTE — Consult Note (Signed)
Reason for Consult: left hip fracture Referring Physician: David Stall, MD  [name] is an 89 y.o. female.  HPI: Patient presents after a fall. Was at home. Reportedly lost her balance attempt to put a shirt on. Now severe left hip pain. Cannot really move left hand. Had fentanyl by EMS. Did not hit head. Not on blood thinners. No neck pain. Previously had a right hip replaced by Dr. Linna Caprice and would like to see him again if possible.   Having a lot of pain  Past Medical History:  Diagnosis Date   Anemia    Anxiety    Asthma    Breast cancer (HCC) 2014   right breast   Cancer of right breast (HCC) 12/26/2012   right breast 12:00 o'clock, DCIS   Carotid artery disease (HCC)    Carpal tunnel syndrome, bilateral    Chronic bronchitis (HCC)    Chronic cough    Chronic diarrhea    3 years   Chronic facial pain    Chronic foot pain    Colon cancer (HCC) dx'd 11/2019   Complication of anesthesia    Sore jaw; could not chew or move mouth, prolonged sedation   Convulsions/seizures (HCC) 10/16/2014   Diabetes mellitus    type 2 niddm x 20 years   Dyslipidemia    Ejection fraction    Gait abnormality 12/04/2019   GERD (gastroesophageal reflux disease)    Hammer toe    bilateral   History of colonic polyps    History of meningioma    HTN (hypertension)    Hx of radiation therapy 03/07/13- 03/29/13   right breast 4250 cGy 17 sessions   Hyperlipidemia    Hypokalemia    Hyponatremia    Hypothyroidism    IBS (irritable bowel syndrome)    Melanoma (HCC)    Metatarsal bone fracture 2014   Multiple drug allergies    Nausea & vomiting 03/12/2020   Nontoxic thyroid nodule    Obesity    Osteoarthritis    Osteoporosis    Palpitations    Personal history of radiation therapy 2014   Seasonal allergies    Skin cancer    Syncope    Tremor, essential 08/18/2016   Vitamin B12 deficiency    Vitamin D deficiency     Past Surgical History:  Procedure Laterality Date   ABDOMINAL  HYSTERECTOMY     BIOPSY  11/07/2019   Procedure: BIOPSY;  Surgeon: Bernette Redbird, MD;  Location: [facility];  Service: Endoscopy;;   BRAIN SURGERY     BREAST BIOPSY Right 01/24/2013   Procedure: RE-EXCICION OF BREAST CANCER, ANTERIOR MARGINS;  Surgeon: Mariella Saa, MD;  Location: [facility];  Service: General;  Laterality: Right;   BREAST LUMPECTOMY Right 2014   BREAST LUMPECTOMY WITH NEEDLE LOCALIZATION Right 01/17/2013   Procedure: BREAST LUMPECTOMY WITH NEEDLE LOCALIZATION;  Surgeon: Mariella Saa, MD;  Location: [facility];  Service: General;  Laterality: Right;   BUNIONECTOMY Bilateral    CATARACT EXTRACTION W/ INTRAOCULAR LENS IMPLANT Right    CHOLECYSTECTOMY     COLONOSCOPY     COLONOSCOPY WITH PROPOFOL N/A 11/07/2019   Procedure: COLONOSCOPY WITH PROPOFOL;  Surgeon: Bernette Redbird, MD;  Location: [facility];  Service: Endoscopy;  Laterality: N/A;   CRANIOTOMY Right 10/18/2013   Procedure: CRANIOTOMY TUMOR EXCISION;  Surgeon: Karn Cassis, MD;  Location: [facility];  Service: Neurosurgery;  Laterality: Right;   ESOPHAGOGASTRODUODENOSCOPY (EGD) WITH PROPOFOL N/A 11/07/2019   Procedure: ESOPHAGOGASTRODUODENOSCOPY (  EGD) WITH PROPOFOL;  Surgeon: Bernette Redbird, MD;  Location: [facility];  Service: Endoscopy;  Laterality: N/A;   EYE SURGERY     HERNIA REPAIR     IR THORACENTESIS ASP PLEURAL SPACE W/IMG GUIDE  04/01/2020   KNEE ARTHROSCOPY Bilateral    LAPAROSCOPIC RIGHT HEMI COLECTOMY Right 03/06/2020   Procedure: LAPAROSCOPIC RIGHT HEMI COLECTOMY WITH TAP BLOCK AND LYSIS OF ADHESIONS;  Surgeon: Andria Meuse, MD;  Location: [facility];  Service: General;  Laterality: Right;   POLYPECTOMY     small adenomatous   TOTAL HIP ARTHROPLASTY Right 06/05/2020   Procedure: ARTHROPLASTY  ANTERIOR APPROACH. RIGHT HIP;  Surgeon: Samson Frederic, MD;  Location: [facility];  Service: Orthopedics;  Laterality: Right;   ULNAR TUNNEL RELEASE      Family History  Problem Relation Age of  Onset   Heart disease Mother    Osteoporosis Mother    Diabetes Father    Pancreatic cancer Father    Bone cancer Sister    Rectal cancer Sister    Thyroid disease Sister        benign goiter resected   Prostate cancer Brother    Colon cancer Brother    Colon cancer Other     Social History:  reports that she has never smoked. She has never used smokeless tobacco. She reports that she does not drink alcohol and does not use drugs.  Allergies:  Allergies  Allergen Reactions   Bystolic [Nebivolol Hcl] Other (See Comments)    "extreme weakness, heaviness in legs & arms, swelling in legs/arms/face, swollen abdomen, pain in bladder, feet pain, soreness in chest"   Carbamazepine Other (See Comments)    Blood poisoning   Cholestyramine Itching, Nausea And Vomiting, Rash and Other (See Comments)    "itching rash on stomach, bloated, nausea, vomiting, sleeplessness, extreme pain in arms"  Patient is taking this in 2022.   Morphine Other (See Comments)    Feels morbid, weak, still in pain   Niacin Palpitations    Fast heart beat Other reaction(s): Unknown   Norvasc [Amlodipine Besylate] Other (See Comments)    "extreme fluid retention/pain)   Optivar [Azelastine Hcl] Photosensitivity   Repaglinide Hives   Sular [Nisoldipine Er] Other (See Comments)    "severe headaches, swelling eyes, hands, feet, shortness of breath, weak, flushed face, brain boiling, fluid retention, high blood sugar, nervous, heart fast beating"   Telmisartan Other (See Comments)    "headache, difficulty urinating, high blood sugar, fluid retention"   Amoxicillin-Pot Clavulanate Rash    Tolerates Unasyn (>10 doses)   Cefdinir Swelling    Vaginal irritation, breathing,    Ciprofloxacin Hcl Hives   Clonidine Other (See Comments)    Dry mouth, fluid retention   Codeine Nausea And Vomiting   Ezetimibe Other (See Comments)    Made weak   Hydroxychloroquine Other (See Comments)    Low platlets   Naproxen Other  (See Comments)    Shrinks bladder   Sulfa Antibiotics Rash   Ziac [Bisoprolol-Hydrochlorothiazide] Other (See Comments)    "stopped urination"   Amlodipine Besy-Benazepril Hcl Cough   Azelastine Other (See Comments)    Unknown reaction - Per MAR   Elavil [Amitriptyline] Other (See Comments)    Gave Pt nightmares   Empagliflozin Other (See Comments)    "Caused yeast infection, slowed my urine"   Hydralazine Other (See Comments)    "does not reduce high blood pressure, pain in arm, high pressure, felt like I was on verge  of heart attack, really weak"   Iodine I 131 Tositumomab Other (See Comments)    Unknown reaction - Per MAR   Kenalog [Triamcinolone] Diarrhea    Per MAR   Keppra [Levetiracetam] Other (See Comments)    Shaking   Lamotrigine Itching and Other (See Comments)   Pregabalin Swelling and Other (See Comments)    Weight gain   Pseudoephedrine Other (See Comments)    Unknown reaction   Pseudoephedrine-Guaifenesin Er Other (See Comments)    Unknown reaction - Per MAR   Risedronate     Unknown reaction - Per MAR   Ru-Hist D [Brompheniramine-Phenylephrine] Other (See Comments)    Unknown reaction - Per MAR   Sumatriptan Other (See Comments)   Topiramate Other (See Comments)    Dry eyes   Ace Inhibitors Other (See Comments)    unknown   Aspirin Other (See Comments)    Unknown reaction   Atacand [Candesartan] Other (See Comments)    Unknown reaction - MAR   Bextra [Valdecoxib] Other (See Comments)    Unknown reaction - Per MAR   Bisoprolol-Hydrochlorothiazide Other (See Comments)    Unknown reaction - Per MAR   Cefadroxil Other (See Comments)    Unknown reaction   Celecoxib Rash   Hydrocodone Other (See Comments)    unknown   Hydrocodone-Acetaminophen Other (See Comments)    unknown   Iodinated Contrast Media Rash and Other (See Comments)    "All over" rash   Meloxicam Other (See Comments)    unknown   Methylprednisolone Sodium Succinate Other (See Comments)     unknown   Nabumetone Other (See Comments)    Unknown reaction   Penicillins Other (See Comments)    unknown   Pseudoephedrine-Guaifenesin Other (See Comments)    unknown   Rofecoxib Other (See Comments)    Unknown reaction - MAR Other reaction(s): Unknown   Ru-Tuss [Chlorphen-Pse-Atrop-Hyos-Scop] Other (See Comments)    unknown   Sulfonamide Derivatives Other (See Comments)    unknown   Sulphur [Elemental Sulfur] Other (See Comments)    unknown   Telithromycin Other (See Comments)    unknown   Terfenadine Other (See Comments)    Unknown reaction - Per MAR   Trandolapril-Verapamil Hcl Er Other (See Comments)    Headache, difficulty urinating, high blood sugar, fluid retention  Pt is taking Tarka (trandolapril-verapamil) currently, but requests the medication stay in her allergy list   Valium [Diazepam] Other (See Comments)    Makes her mean and hyper    Medications: I have reviewed the patient's current medications. Scheduled:  cholestyramine light  4 g Oral BID   cyanocobalamin  1,000 mcg Intramuscular Q21 days   insulin aspart  0-9 Units Subcutaneous TID WC   insulin glargine-yfgn  10 Units Subcutaneous QHS   levothyroxine  75 mcg Oral QAC breakfast   pantoprazole  40 mg Oral Daily   phenytoin  50 mg Oral See admin instructions   phenytoin  300 mg Oral QHS   primidone  150 mg Oral QHS   primidone  250 mg Oral Daily   sodium chloride  2 g Oral BID WC   verapamil  180 mg Oral Daily    Results for orders placed or performed during the hospital encounter of 12/09/22 (from the past 24 hour(s))  Basic metabolic panel     Status: Abnormal   Collection Time: 12/09/22  1:19 PM  Result Value Ref Range   Sodium 125 (L) 135 - 145 mmol/L  Potassium 4.4 3.5 - 5.1 mmol/L   Chloride 94 (L) 98 - 111 mmol/L   CO2 21 (L) 22 - 32 mmol/L   Glucose, Bld 180 (H) 70 - 99 mg/dL   BUN 13 8 - 23 mg/dL   Creatinine, Ser 1.61 0.44 - 1.00 mg/dL   Calcium 8.5 (L) 8.9 - 10.3 mg/dL    GFR, Estimated >09 >60 mL/min   Anion gap 10 5 - 15  CBC with Differential     Status: Abnormal   Collection Time: 12/09/22  1:19 PM  Result Value Ref Range   WBC 8.3 4.0 - 10.5 K/uL   RBC 3.29 (L) 3.87 - 5.11 MIL/uL   Hemoglobin 9.7 (L) 12.0 - 15.0 g/dL   HCT 45.4 (L) 09.8 - 11.9 %   MCV 93.3 80.0 - 100.0 fL   MCH 29.5 26.0 - 34.0 pg   MCHC 31.6 30.0 - 36.0 g/dL   RDW 14.7 82.9 - 56.2 %   Platelets 293 150 - 400 K/uL   nRBC 0.0 0.0 - 0.2 %   Neutrophils Relative % 78 %   Neutro Abs 6.6 1.7 - 7.7 K/uL   Lymphocytes Relative 14 %   Lymphs Abs 1.1 0.7 - 4.0 K/uL   Monocytes Relative 6 %   Monocytes Absolute 0.5 0.1 - 1.0 K/uL   Eosinophils Relative 1 %   Eosinophils Absolute 0.0 0.0 - 0.5 K/uL   Basophils Relative 0 %   Basophils Absolute 0.0 0.0 - 0.1 K/uL   Immature Granulocytes 1 %   Abs Immature Granulocytes 0.05 0.00 - 0.07 K/uL  Protime-INR     Status: None   Collection Time: 12/09/22  1:19 PM  Result Value Ref Range   Prothrombin Time 13.6 11.4 - 15.2 seconds   INR 1.0 0.8 - 1.2  Type and screen [facility]     Status: None   Collection Time: 12/09/22  1:19 PM  Result Value Ref Range   ABO/RH(D) A POS    Antibody Screen NEG    Sample Expiration      12/12/2022,2359 Performed at [facility], [address], [place], [place] [address]   Phenytoin level, total     Status: Abnormal   Collection Time: 12/09/22  1:19 PM  Result Value Ref Range   Phenytoin Lvl 6.2 (L) 10.0 - 20.0 ug/mL  Glucose, capillary     Status: Abnormal   Collection Time: 12/09/22  9:45 PM  Result Value Ref Range   Glucose-Capillary 219 (H) 70 - 99 mg/dL  CBC     Status: Abnormal   Collection Time: 12/10/22  4:13 AM  Result Value Ref Range   WBC 10.1 4.0 - 10.5 K/uL   RBC 3.34 (L) 3.87 - 5.11 MIL/uL   Hemoglobin 9.9 (L) 12.0 - 15.0 g/dL   HCT 57.8 (L) 46.9 - 62.9 %   MCV 92.2 80.0 - 100.0 fL   MCH 29.6 26.0 - 34.0 pg   MCHC 32.1 30.0 - 36.0 g/dL    RDW 52.8 41.3 - 24.4 %   Platelets 315 150 - 400 K/uL   nRBC 0.0 0.0 - 0.2 %  Comprehensive metabolic panel     Status: Abnormal   Collection Time: 12/10/22  4:13 AM  Result Value Ref Range   Sodium 127 (L) 135 - 145 mmol/L   Potassium 3.7 3.5 - 5.1 mmol/L   Chloride 98 98 - 111 mmol/L   CO2 19 (L) 22 - 32 mmol/L  Glucose, Bld 146 (H) 70 - 99 mg/dL   BUN 9 8 - 23 mg/dL   Creatinine, Ser 3.24 0.44 - 1.00 mg/dL   Calcium 7.6 (L) 8.9 - 10.3 mg/dL   Total Protein 5.2 (L) 6.5 - 8.1 g/dL   Albumin 2.6 (L) 3.5 - 5.0 g/dL   AST 23 15 - 41 U/L   ALT 14 0 - 44 U/L   Alkaline Phosphatase 75 38 - 126 U/L   Total Bilirubin 0.3 0.3 - 1.2 mg/dL   GFR, Estimated >40 >10 mL/min   Anion gap 10 5 - 15   *Note: Due to a large number of results and/or encounters for the requested time period, some results have not been displayed. A complete set of results can be found in Results Review.     X-ray: CLINICAL DATA:  Pain after fall   EXAM: DG HIP (WITH OR WITHOUT PELVIS) 3V LEFT   COMPARISON:  X-ray 06/11/2022   FINDINGS: There is a minimally displaced subcapital femoral neck fracture of the left hip. Osteopenia. Right hemiarthroplasty identified. Expected alignment. The upper aspect of the pelvis is clipped off the edge of the film.   IMPRESSION: Mm displaced subcapital femoral neck fracture of the left hip. Osteopenia.   Right hip hemiarthroplasty     Electronically Signed   By: Karen Kays M.D.  ROS: As per HPI  Blood pressure 132/62, pulse 69, temperature 98 F (36.7 C), temperature source Oral, resp. rate (!) 24, height 5\' 7"  (1.702 m), weight 65 kg, SpO2 99%.  Physical Exam: Vitals and nursing note reviewed.  HENT:     Head: Atraumatic.  Cardiovascular:     Rate and Rhythm: Regular rhythm.  Chest:     Chest wall: No tenderness.  Abdominal:     Tenderness: There is no abdominal tenderness.  Musculoskeletal:        General: Tenderness present.     Cervical back:  Neck supple.     Comments: Tenderness over left hip.  LLE slightly shortened and externally rotated.  Neurological:     Mental Status: She is alert and oriented to person, place, and time.   Assessment/Plan: Left hip femoral neck fracture  Plan: She will need a THR to address her femoral neck fracture NPO for now as we try and find time in the OR today versus tomorrow I will also be looking for help from [facility] who replaced her right hip when she broke her hip a couple of years ago  Shelda Pal 12/10/2022, 7:04 AM

## 2022-12-10 NOTE — Progress Notes (Addendum)
Pt brought to [facility] for nerve block lelft hip   pt. Crying in pain.   A&O  no family at bedside.   See block time out ;  Fentynal 50 mg given for block 0838 post block- pt. Resting;

## 2022-12-10 NOTE — Progress Notes (Signed)
Initial Nutrition Assessment  DOCUMENTATION CODES:   Not applicable  INTERVENTION:  - Recommend Regular diet to promote intake given advanced age. - Boost Breeze po BID, each supplement provides 250 kcal and 9 grams of protein - Monitor weight trends.   NUTRITION DIAGNOSIS:   Increased nutrient needs related to acute illness as evidenced by estimated needs.  GOAL:   Patient will meet greater than or equal to 90% of their needs  MONITOR:   PO intake, Supplement acceptance, Weight trends  REASON FOR ASSESSMENT:   Consult Assessment of nutrition requirement/status  ASSESSMENT:   89 y.o. female PMH significant for unspecified anemia, asthma/chronic bronchitis, right breast cancer history of radiation, carotid artery stenosis, diabetes mellitus type 2 who was brought into the ED after a fall. Admitted for closed left hip fracture.  Patient crying in pain at time of visit. RN at bedside reports her IV's are not working properly so she hasn't been able to get IV pain meds.  Unable to obtain nutrition history at this time.   Per EMR, weight stable/up since October. Patient currently on a diet although per surgery notes possible plan for OR today.   Medications reviewed and include: vitamin B12 (IM injection every 21 days), Insulin, Dilantin, Miralax  Labs reviewed:  Na 127 HA1C 6.9 Blood Glucose 129-219 x24 hours   NUTRITION - FOCUSED PHYSICAL EXAM:  Unable to perform, patient crying in pain  Diet Order:   Diet Order             Diet heart healthy/carb modified Fluid consistency: Thin; Fluid restriction: 1200 mL Fluid  Diet effective now                   EDUCATION NEEDS:  Not appropriate for education at this time  Skin:  Skin Assessment: Reviewed RN Assessment  Last BM:  8/6  Height:  Ht Readings from Last 1 Encounters:  12/09/22 5\' 7"  (1.702 m)   Weight:  Wt Readings from Last 1 Encounters:  12/09/22 65 kg    BMI:  Body mass index is  22.44 kg/m.  Estimated Nutritional Needs:  Kcal:  1650-1800 kcals Protein:  70-85 grams Fluid:  >/= 1.7L    Shelle Iron RD, LDN For contact information, refer to [facility].

## 2022-12-10 NOTE — Anesthesia Procedure Notes (Signed)
Anesthesia Regional Block: Femoral nerve block   Pre-Anesthetic Checklist: , timeout performed,  Correct Patient, Correct Site, Correct Laterality,  Correct Procedure, Correct Position, site marked,  Risks and benefits discussed,  Pre-op evaluation,  At surgeon's request and post-op pain management  Laterality: Left  Prep: Maximum Sterile Barrier Precautions used, chloraprep       Needles:  Injection technique: Single-shot  Needle Type: Echogenic Stimulator Needle     Needle Length: 9cm  Needle Gauge: 22     Additional Needles:   Procedures:,,,, ultrasound used (permanent image in chart),,    Narrative:  Start time: 12/10/2022 8:26 AM End time: 12/10/2022 8:29 AM Injection made incrementally with aspirations every 5 mL.  Performed by: Personally  Anesthesiologist: Kaylyn Layer, MD  Additional Notes: Risks, benefits, and alternative discussed. Patient gave consent for procedure. Patient prepped and draped in sterile fashion. Sedation administered, patient remains easily responsive to voice. Relevant anatomy identified with ultrasound guidance. Local anesthetic given in 5cc increments with no signs or symptoms of intravascular injection. No pain or paraesthesias with injection. Patient monitored throughout procedure with signs of LAST or immediate complications. Tolerated well. Ultrasound image placed in chart.  Amalia Greenhouse, MD

## 2022-12-11 ENCOUNTER — Inpatient Hospital Stay (HOSPITAL_COMMUNITY): Payer: Medicare Other

## 2022-12-11 DIAGNOSIS — E871 Hypo-osmolality and hyponatremia: Secondary | ICD-10-CM | POA: Diagnosis not present

## 2022-12-11 DIAGNOSIS — E039 Hypothyroidism, unspecified: Secondary | ICD-10-CM | POA: Diagnosis not present

## 2022-12-11 DIAGNOSIS — S72002A Fracture of unspecified part of neck of left femur, initial encounter for closed fracture: Secondary | ICD-10-CM | POA: Diagnosis not present

## 2022-12-11 LAB — SURGICAL PCR SCREEN
MRSA, PCR: NEGATIVE
Staphylococcus aureus: NEGATIVE

## 2022-12-11 LAB — GLUCOSE, CAPILLARY
Glucose-Capillary: 101 mg/dL — ABNORMAL HIGH (ref 70–99)
Glucose-Capillary: 102 mg/dL — ABNORMAL HIGH (ref 70–99)
Glucose-Capillary: 107 mg/dL — ABNORMAL HIGH (ref 70–99)
Glucose-Capillary: 235 mg/dL — ABNORMAL HIGH (ref 70–99)

## 2022-12-11 MED ORDER — MUPIROCIN 2 % EX OINT
1.0000 | TOPICAL_OINTMENT | Freq: Two times a day (BID) | CUTANEOUS | Status: DC
  Administered 2022-12-11 – 2022-12-14 (×7): 1 via NASAL
  Filled 2022-12-11: qty 22

## 2022-12-11 MED ORDER — ENOXAPARIN SODIUM 30 MG/0.3ML IJ SOSY
30.0000 mg | PREFILLED_SYRINGE | Freq: Once | INTRAMUSCULAR | Status: AC
  Administered 2022-12-11: 30 mg via SUBCUTANEOUS
  Filled 2022-12-11: qty 0.3

## 2022-12-11 MED ORDER — VANCOMYCIN HCL IN DEXTROSE 1-5 GM/200ML-% IV SOLN
1000.0000 mg | INTRAVENOUS | Status: AC
  Administered 2022-12-12: 1000 mg via INTRAVENOUS
  Filled 2022-12-11: qty 200

## 2022-12-11 MED ORDER — CHLORHEXIDINE GLUCONATE CLOTH 2 % EX PADS
1.0000 | MEDICATED_PAD | CUTANEOUS | Status: AC
  Administered 2022-12-12: 1 via TOPICAL

## 2022-12-11 MED ORDER — TRANEXAMIC ACID-NACL 1000-0.7 MG/100ML-% IV SOLN
1000.0000 mg | INTRAVENOUS | Status: AC
  Administered 2022-12-12: 1000 mg via INTRAVENOUS

## 2022-12-11 MED ORDER — POVIDONE-IODINE 10 % EX SWAB
2.0000 | CUTANEOUS | Status: AC
  Administered 2022-12-12: 2 via TOPICAL

## 2022-12-11 MED ORDER — POVIDONE-IODINE 10 % EX SWAB
2.0000 | CUTANEOUS | Status: DC
  Administered 2022-12-12: 2 via TOPICAL

## 2022-12-11 MED ORDER — CEFAZOLIN SODIUM-DEXTROSE 2-4 GM/100ML-% IV SOLN: 2.0000 g | INTRAVENOUS | Status: DC

## 2022-12-11 MED ORDER — POVIDONE-IODINE 10 % EX SWAB: 2.0000 | Freq: Once | CUTANEOUS | Status: DC

## 2022-12-11 MED ORDER — ACETAMINOPHEN 500 MG PO TABS
1000.0000 mg | ORAL_TABLET | ORAL | Status: DC
  Filled 2022-12-11: qty 2

## 2022-12-11 NOTE — Plan of Care (Signed)
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
## 2022-12-12 ENCOUNTER — Inpatient Hospital Stay (HOSPITAL_COMMUNITY): Payer: Medicare Other

## 2022-12-12 ENCOUNTER — Inpatient Hospital Stay (HOSPITAL_COMMUNITY): Payer: Medicare Other | Admitting: Anesthesiology

## 2022-12-12 ENCOUNTER — Encounter (HOSPITAL_COMMUNITY)
Admission: EM | Disposition: A | Discharge status: Skilled Nursing Facility | Payer: Self-pay | Source: Home / Self Care | Attending: Internal Medicine

## 2022-12-12 DIAGNOSIS — S72002A Fracture of unspecified part of neck of left femur, initial encounter for closed fracture: Secondary | ICD-10-CM

## 2022-12-12 DIAGNOSIS — E871 Hypo-osmolality and hyponatremia: Secondary | ICD-10-CM | POA: Diagnosis not present

## 2022-12-12 DIAGNOSIS — I10 Essential (primary) hypertension: Secondary | ICD-10-CM

## 2022-12-12 DIAGNOSIS — J45909 Unspecified asthma, uncomplicated: Secondary | ICD-10-CM

## 2022-12-12 HISTORY — PX: TOTAL HIP ARTHROPLASTY: SHX124

## 2022-12-12 LAB — GLUCOSE, CAPILLARY
Glucose-Capillary: 171 mg/dL — ABNORMAL HIGH (ref 70–99)
Glucose-Capillary: 155 mg/dL — ABNORMAL HIGH (ref 70–99)
Glucose-Capillary: 187 mg/dL — ABNORMAL HIGH (ref 70–99)
Glucose-Capillary: 230 mg/dL — ABNORMAL HIGH (ref 70–99)
Glucose-Capillary: 207 mg/dL — ABNORMAL HIGH (ref 70–99)

## 2022-12-12 SURGERY — ARTHROPLASTY, HIP, TOTAL, ANTERIOR APPROACH
Anesthesia: General | Site: Hip | Laterality: Left

## 2022-12-12 MED ORDER — VANCOMYCIN HCL IN DEXTROSE 1-5 GM/200ML-% IV SOLN
INTRAVENOUS | Status: AC
  Filled 2022-12-12: qty 200

## 2022-12-12 MED ORDER — ONDANSETRON HCL 4 MG/2ML IJ SOLN
INTRAMUSCULAR | Status: AC
  Filled 2022-12-12: qty 2

## 2022-12-12 MED ORDER — 0.9 % SODIUM CHLORIDE (POUR BTL) OPTIME
TOPICAL | Status: DC | PRN
  Administered 2022-12-12: 1000 mL

## 2022-12-12 MED ORDER — CHOLESTYRAMINE 4 G PO PACK
4.0000 g | PACK | Freq: Two times a day (BID) | ORAL | Status: DC
  Filled 2022-12-12: qty 1

## 2022-12-12 MED ORDER — ACETAMINOPHEN 10 MG/ML IV SOLN: 1000.0000 mg | Freq: Once | INTRAVENOUS | Status: DC | PRN

## 2022-12-12 MED ORDER — ROCURONIUM BROMIDE 10 MG/ML (PF) SYRINGE
PREFILLED_SYRINGE | INTRAVENOUS | Status: DC | PRN
  Administered 2022-12-12 (×3): 10 mg via INTRAVENOUS
  Administered 2022-12-12: 50 mg via INTRAVENOUS

## 2022-12-12 MED ORDER — ACETAMINOPHEN 325 MG PO TABS
325.0000 mg | ORAL_TABLET | Freq: Four times a day (QID) | ORAL | Status: DC | PRN
  Administered 2022-12-15: 650 mg via ORAL
  Filled 2022-12-12: qty 2

## 2022-12-12 MED ORDER — DEXAMETHASONE SODIUM PHOSPHATE 10 MG/ML IJ SOLN
INTRAMUSCULAR | Status: AC
  Filled 2022-12-12: qty 1

## 2022-12-12 MED ORDER — POLYETHYLENE GLYCOL 3350 17 G PO PACK: 17.0000 g | PACK | Freq: Every day | ORAL | Status: DC | PRN

## 2022-12-12 MED ORDER — METHOCARBAMOL 1000 MG/10ML IJ SOLN
500.0000 mg | Freq: Four times a day (QID) | INTRAVENOUS | Status: DC | PRN
  Filled 2022-12-12: qty 5

## 2022-12-12 MED ORDER — PROPOFOL 10 MG/ML IV BOLUS
INTRAVENOUS | Status: AC
  Filled 2022-12-12: qty 20

## 2022-12-12 MED ORDER — ACETAMINOPHEN 160 MG/5ML PO SOLN: 325.0000 mg | Freq: Once | ORAL | Status: DC | PRN

## 2022-12-12 MED ORDER — SODIUM CHLORIDE 0.9 % IV SOLN: INTRAVENOUS | Status: DC

## 2022-12-12 MED ORDER — ONDANSETRON HCL 4 MG PO TABS: 4.0000 mg | ORAL_TABLET | Freq: Four times a day (QID) | ORAL | Status: DC | PRN

## 2022-12-12 MED ORDER — SENNA 8.6 MG PO TABS
1.0000 | ORAL_TABLET | Freq: Two times a day (BID) | ORAL | Status: DC
  Administered 2022-12-14 – 2022-12-15 (×2): 8.6 mg via ORAL
  Filled 2022-12-12 (×4): qty 1

## 2022-12-12 MED ORDER — HYDROMORPHONE HCL 1 MG/ML IJ SOLN
0.5000 mg | INTRAMUSCULAR | Status: DC | PRN
  Administered 2022-12-13 – 2022-12-14 (×4): 1 mg via INTRAVENOUS
  Filled 2022-12-12 (×4): qty 1

## 2022-12-12 MED ORDER — FENTANYL CITRATE (PF) 100 MCG/2ML IJ SOLN
INTRAMUSCULAR | Status: AC
  Filled 2022-12-12: qty 2

## 2022-12-12 MED ORDER — PROPOFOL 500 MG/50ML IV EMUL
INTRAVENOUS | Status: AC
Start: 2022-12-12 — End: ?
  Filled 2022-12-12: qty 50

## 2022-12-12 MED ORDER — DOCUSATE SODIUM 100 MG PO CAPS
100.0000 mg | ORAL_CAPSULE | Freq: Two times a day (BID) | ORAL | Status: DC
  Administered 2022-12-14 – 2022-12-15 (×2): 100 mg via ORAL
  Filled 2022-12-12 (×4): qty 1

## 2022-12-12 MED ORDER — MENTHOL 3 MG MT LOZG: 1.0000 | LOZENGE | OROMUCOSAL | Status: DC | PRN

## 2022-12-12 MED ORDER — FENTANYL CITRATE (PF) 100 MCG/2ML IJ SOLN
INTRAMUSCULAR | Status: DC | PRN
  Administered 2022-12-12 (×6): 25 ug via INTRAVENOUS

## 2022-12-12 MED ORDER — TRANEXAMIC ACID-NACL 1000-0.7 MG/100ML-% IV SOLN
INTRAVENOUS | Status: AC
  Filled 2022-12-12: qty 100

## 2022-12-12 MED ORDER — VITAMIN D 25 MCG (1000 UNIT) PO TABS
5000.0000 [IU] | ORAL_TABLET | Freq: Every day | ORAL | Status: DC
  Administered 2022-12-13 – 2022-12-15 (×3): 5000 [IU] via ORAL
  Filled 2022-12-12 (×3): qty 5

## 2022-12-12 MED ORDER — ALBUMIN HUMAN 5 % IV SOLN: INTRAVENOUS | Status: DC | PRN

## 2022-12-12 MED ORDER — ACETAMINOPHEN 325 MG PO TABS: 325.0000 mg | ORAL_TABLET | Freq: Once | ORAL | Status: DC | PRN

## 2022-12-12 MED ORDER — LIDOCAINE 2% (20 MG/ML) 5 ML SYRINGE
INTRAMUSCULAR | Status: DC | PRN
  Administered 2022-12-12: 40 mg via INTRAVENOUS

## 2022-12-12 MED ORDER — SUGAMMADEX SODIUM 200 MG/2ML IV SOLN
INTRAVENOUS | Status: DC | PRN
  Administered 2022-12-12: 150 mg via INTRAVENOUS

## 2022-12-12 MED ORDER — MEPERIDINE HCL 50 MG/ML IJ SOLN: 6.2500 mg | INTRAMUSCULAR | Status: DC | PRN

## 2022-12-12 MED ORDER — ACETAMINOPHEN 10 MG/ML IV SOLN
INTRAVENOUS | Status: DC | PRN
  Administered 2022-12-12: 1000 mg via INTRAVENOUS

## 2022-12-12 MED ORDER — PRONTOSAN WOUND IRRIGATION OPTIME
TOPICAL | Status: DC | PRN
  Administered 2022-12-12: 1 via TOPICAL

## 2022-12-12 MED ORDER — ONDANSETRON HCL 4 MG/2ML IJ SOLN
INTRAMUSCULAR | Status: DC | PRN
  Administered 2022-12-12: 4 mg via INTRAVENOUS

## 2022-12-12 MED ORDER — ACETAMINOPHEN 10 MG/ML IV SOLN
INTRAVENOUS | Status: AC
  Filled 2022-12-12: qty 100

## 2022-12-12 MED ORDER — METOCLOPRAMIDE HCL 5 MG/ML IJ SOLN: 5.0000 mg | Freq: Three times a day (TID) | INTRAMUSCULAR | Status: DC | PRN

## 2022-12-12 MED ORDER — STERILE WATER FOR IRRIGATION IR SOLN
Status: DC | PRN
  Administered 2022-12-12: 2000 mL

## 2022-12-12 MED ORDER — FENTANYL CITRATE PF 50 MCG/ML IJ SOSY
PREFILLED_SYRINGE | INTRAMUSCULAR | Status: AC
  Filled 2022-12-12: qty 2

## 2022-12-12 MED ORDER — HYDROMORPHONE HCL 1 MG/ML IJ SOLN
0.5000 mg | Freq: Once | INTRAMUSCULAR | Status: AC
  Administered 2022-12-12: 0.5 mg via INTRAVENOUS
  Filled 2022-12-12: qty 0.5

## 2022-12-12 MED ORDER — PROMETHAZINE HCL 25 MG/ML IJ SOLN: 6.2500 mg | INTRAMUSCULAR | Status: DC | PRN

## 2022-12-12 MED ORDER — APIXABAN 2.5 MG PO TABS
2.5000 mg | ORAL_TABLET | Freq: Two times a day (BID) | ORAL | Status: DC
  Administered 2022-12-13 – 2022-12-15 (×6): 2.5 mg via ORAL
  Filled 2022-12-12 (×6): qty 1

## 2022-12-12 MED ORDER — PROPOFOL 1000 MG/100ML IV EMUL
INTRAVENOUS | Status: AC
  Filled 2022-12-12: qty 100

## 2022-12-12 MED ORDER — KETOROLAC TROMETHAMINE 30 MG/ML IJ SOLN
INTRAMUSCULAR | Status: AC
  Filled 2022-12-12: qty 1

## 2022-12-12 MED ORDER — LIDOCAINE HCL (PF) 2 % IJ SOLN
INTRAMUSCULAR | Status: AC
  Filled 2022-12-12: qty 5

## 2022-12-12 MED ORDER — LACTATED RINGERS IV SOLN: INTRAVENOUS | Status: DC | PRN

## 2022-12-12 MED ORDER — VANCOMYCIN HCL IN DEXTROSE 1-5 GM/200ML-% IV SOLN
1000.0000 mg | Freq: Two times a day (BID) | INTRAVENOUS | Status: AC
  Administered 2022-12-12: 1000 mg via INTRAVENOUS
  Filled 2022-12-12: qty 200

## 2022-12-12 MED ORDER — ACETAMINOPHEN 500 MG PO TABS
1000.0000 mg | ORAL_TABLET | Freq: Four times a day (QID) | ORAL | Status: AC
  Administered 2022-12-12 (×2): 1000 mg via ORAL
  Filled 2022-12-12 (×3): qty 2

## 2022-12-12 MED ORDER — ISOPROPYL ALCOHOL 70 % SOLN
Status: AC
Start: 2022-12-12 — End: ?
  Filled 2022-12-12: qty 480

## 2022-12-12 MED ORDER — ROCURONIUM BROMIDE 10 MG/ML (PF) SYRINGE
PREFILLED_SYRINGE | INTRAVENOUS | Status: AC
  Filled 2022-12-12: qty 10

## 2022-12-12 MED ORDER — DIPHENHYDRAMINE HCL 12.5 MG/5ML PO ELIX: 12.5000 mg | ORAL_SOLUTION | ORAL | Status: DC | PRN

## 2022-12-12 MED ORDER — PHENOL 1.4 % MT LIQD
1.0000 | OROMUCOSAL | Status: DC | PRN
  Administered 2022-12-13: 1 via OROMUCOSAL
  Filled 2022-12-12: qty 177

## 2022-12-12 MED ORDER — BUPIVACAINE-EPINEPHRINE 0.25% -1:200000 IJ SOLN
INTRAMUSCULAR | Status: AC
  Filled 2022-12-12: qty 1

## 2022-12-12 MED ORDER — LACTATED RINGERS IV SOLN: INTRAVENOUS | Status: DC

## 2022-12-12 MED ORDER — METHOCARBAMOL 500 MG PO TABS
500.0000 mg | ORAL_TABLET | Freq: Four times a day (QID) | ORAL | Status: DC | PRN
  Administered 2022-12-14 – 2022-12-15 (×3): 500 mg via ORAL
  Filled 2022-12-12 (×4): qty 1

## 2022-12-12 MED ORDER — FENTANYL CITRATE PF 50 MCG/ML IJ SOSY: 25.0000 ug | PREFILLED_SYRINGE | INTRAMUSCULAR | Status: DC | PRN

## 2022-12-12 MED ORDER — SODIUM CHLORIDE (PF) 0.9 % IJ SOLN
INTRAMUSCULAR | Status: DC | PRN
  Administered 2022-12-12: 61 mL via INTRAMUSCULAR

## 2022-12-12 MED ORDER — PROPOFOL 500 MG/50ML IV EMUL
INTRAVENOUS | Status: DC | PRN
  Administered 2022-12-12: 125 ug/kg/min via INTRAVENOUS

## 2022-12-12 MED ORDER — ALUM & MAG HYDROXIDE-SIMETH 200-200-20 MG/5ML PO SUSP: 30.0000 mL | ORAL | Status: DC | PRN

## 2022-12-12 MED ORDER — DEXAMETHASONE SODIUM PHOSPHATE 10 MG/ML IJ SOLN
INTRAMUSCULAR | Status: DC | PRN
  Administered 2022-12-12: 4 mg via INTRAVENOUS

## 2022-12-12 MED ORDER — OXYCODONE HCL 5 MG PO TABS
10.0000 mg | ORAL_TABLET | ORAL | Status: DC | PRN
  Administered 2022-12-14 (×2): 15 mg via ORAL
  Filled 2022-12-12 (×2): qty 3

## 2022-12-12 MED ORDER — ONDANSETRON HCL 4 MG/2ML IJ SOLN: 4.0000 mg | Freq: Four times a day (QID) | INTRAMUSCULAR | Status: DC | PRN

## 2022-12-12 MED ORDER — SODIUM CHLORIDE (PF) 0.9 % IJ SOLN
INTRAMUSCULAR | Status: AC
  Filled 2022-12-12: qty 50

## 2022-12-12 MED ORDER — OXYCODONE HCL 5 MG PO TABS
5.0000 mg | ORAL_TABLET | ORAL | Status: DC | PRN
  Administered 2022-12-12 – 2022-12-15 (×5): 10 mg via ORAL
  Filled 2022-12-12 (×5): qty 2

## 2022-12-12 MED ORDER — AMISULPRIDE (ANTIEMETIC) 5 MG/2ML IV SOLN: 10.0000 mg | Freq: Once | INTRAVENOUS | Status: DC | PRN

## 2022-12-12 MED ORDER — METOCLOPRAMIDE HCL 5 MG PO TABS: 5.0000 mg | ORAL_TABLET | Freq: Three times a day (TID) | ORAL | Status: DC | PRN

## 2022-12-12 SURGICAL SUPPLY — 57 items
ACE SHELL 3H 52 E HIP (Shell) ×1 IMPLANT
ADH SKN CLS APL DERMABOND .7 (GAUZE/BANDAGES/DRESSINGS) ×2
APL PRP STRL LF DISP 70% ISPRP (MISCELLANEOUS) ×2
BAG COUNTER SPONGE SURGICOUNT (BAG) IMPLANT
BAG SPEC THK2 15X12 ZIP CLS (MISCELLANEOUS)
BAG SPNG CNTER NS LX DISP (BAG)
BAG ZIPLOCK 12X15 (MISCELLANEOUS) IMPLANT
CABLE CERLAGE W/CRIMP 1.8 (Cable) IMPLANT
CHLORAPREP W/TINT 26 (MISCELLANEOUS) ×2 IMPLANT
COVER PERINEAL POST (MISCELLANEOUS) ×2 IMPLANT
COVER SURGICAL LIGHT HANDLE (MISCELLANEOUS) ×2 IMPLANT
DERMABOND ADVANCED .7 DNX12 (GAUZE/BANDAGES/DRESSINGS) ×4 IMPLANT
DRAPE IMP U-DRAPE 54X76 (DRAPES) ×2 IMPLANT
DRAPE SHEET LG 3/4 BI-LAMINATE (DRAPES) ×6 IMPLANT
DRAPE STERI IOBAN 125X83 (DRAPES) ×2 IMPLANT
DRAPE U-SHAPE 47X51 STRL (DRAPES) ×2 IMPLANT
DRSG AQUACEL AG ADV 3.5X10 (GAUZE/BANDAGES/DRESSINGS) ×2 IMPLANT
ELECT REM PT RETURN 15FT ADLT (MISCELLANEOUS) ×2 IMPLANT
GAUZE SPONGE 4X4 12PLY STRL (GAUZE/BANDAGES/DRESSINGS) ×2 IMPLANT
GLOVE BIO SURGEON STRL SZ7 (GLOVE) ×2 IMPLANT
GLOVE BIO SURGEON STRL SZ8.5 (GLOVE) ×4 IMPLANT
GLOVE BIOGEL PI IND STRL 7.5 (GLOVE) ×2 IMPLANT
GLOVE BIOGEL PI IND STRL 8.5 (GLOVE) ×2 IMPLANT
GOWN SPEC L3 XXLG W/TWL (GOWN DISPOSABLE) ×2 IMPLANT
GOWN STRL REUS W/ TWL XL LVL3 (GOWN DISPOSABLE) ×2 IMPLANT
GOWN STRL REUS W/TWL XL LVL3 (GOWN DISPOSABLE) ×2
HANDPIECE INTERPULSE COAX TIP (DISPOSABLE) ×2
HD FEM MOD 36M STD (Miscellaneous) ×1 IMPLANT
HEAD FEM MOD 36M STD (Miscellaneous) IMPLANT
HOLDER FOLEY CATH W/STRAP (MISCELLANEOUS) ×2 IMPLANT
HOOD PEEL AWAY T7 (MISCELLANEOUS) ×6 IMPLANT
KIT TURNOVER KIT A (KITS) IMPLANT
LINER ACE G7 HIGH 36 SZ E (Liner) IMPLANT
MANIFOLD NEPTUNE II (INSTRUMENTS) ×2 IMPLANT
MARKER SKIN DUAL TIP RULER LAB (MISCELLANEOUS) ×2 IMPLANT
NDL SAFETY ECLIP 18X1.5 (MISCELLANEOUS) ×2 IMPLANT
NDL SPNL 18GX3.5 QUINCKE PK (NEEDLE) ×2 IMPLANT
NEEDLE SPNL 18GX3.5 QUINCKE PK (NEEDLE) ×2 IMPLANT
PACK ANTERIOR HIP CUSTOM (KITS) ×2 IMPLANT
SAW OSC TIP CART 19.5X105X1.3 (SAW) ×2 IMPLANT
SEALER BIPOLAR AQUA 6.0 (INSTRUMENTS) ×2 IMPLANT
SET HNDPC FAN SPRY TIP SCT (DISPOSABLE) ×2 IMPLANT
SHELL ACETAB 3H 52 E HIP (Shell) IMPLANT
SOLUTION PRONTOSAN WOUND 350ML (IRRIGATION / IRRIGATOR) ×2 IMPLANT
SPIKE FLUID TRANSFER (MISCELLANEOUS) ×2 IMPLANT
STEM FEM HO ARCOS 20X175 (Stem) IMPLANT
SUT MNCRL AB 3-0 PS2 18 (SUTURE) ×2 IMPLANT
SUT MON AB 2-0 CT1 36 (SUTURE) ×2 IMPLANT
SUT STRATAFIX PDO 1 14 VIOLET (SUTURE) ×2
SUT STRATFX PDO 1 14 VIOLET (SUTURE) ×2
SUT VIC AB 2-0 CT1 27 (SUTURE)
SUT VIC AB 2-0 CT1 TAPERPNT 27 (SUTURE) IMPLANT
SUTURE STRATFX PDO 1 14 VIOLET (SUTURE) ×2 IMPLANT
SYR 3ML LL SCALE MARK (SYRINGE) ×2 IMPLANT
TRAY FOLEY MTR SLVR 16FR STAT (SET/KITS/TRAYS/PACK) IMPLANT
TUBE SUCTION HIGH CAP CLEAR NV (SUCTIONS) ×2 IMPLANT
WATER STERILE IRR 1000ML POUR (IV SOLUTION) ×2 IMPLANT

## 2022-12-12 NOTE — Interval H&P Note (Signed)
History and Physical Interval Note:  12/12/2022 10:51 AM  [name]  has presented today for surgery, with the diagnosis of Lt. Femoral Neck Fracture.  The various methods of treatment have been discussed with the patient and family. After consideration of risks, benefits and other options for treatment, the patient has consented to  Procedure(s): TOTAL HIP ARTHROPLASTY ANTERIOR APPROACH (Left) as a surgical intervention.  The patient's history has been reviewed, patient examined, no change in status, stable for surgery.  I have reviewed the patient's chart and labs.  Questions were answered to the patient's satisfaction.    The risks, benefits, and alternatives were discussed with the patient. There are risks associated with the surgery including, but not limited to, problems with anesthesia (death), infection, instability (giving out of the joint), dislocation, differences in leg length/angulation/rotation, fracture of bones, loosening or failure of implants, hematoma (blood accumulation) which [place] require surgical drainage, blood clots, pulmonary embolism, nerve injury (foot drop and lateral thigh numbness), and blood vessel injury. The patient understands these risks and elects to proceed.    Iline Oven 

## 2022-12-12 NOTE — Progress Notes (Signed)
[facility] PROGRESS NOTE    Progress Note  [name]  XBJ:[id number] DOB: [birth year] DOA: 12/09/2022 PCP: Verlon Au, MD     Brief Narrative:   Keyley Swiggum Kissler is an 89 y.o. female past medical history significant for unspecified anemia, asthma/chronic bronchitis, right breast cancer history of radiation, carotid artery stenosis, diabetes mellitus type 2, brought into the ED via EMS after a fall, hemoglobin was 9 white blood cell count 8.3 chest x-ray showed no acute findings she was complaining of left hip pain and imaging showed minimally displaced subcapital femoral neck fracture with repeat surgery was consulted   Assessment/Plan:   Closed left hip fracture, initial encounter (HCC) Continue analgesics and antiemetics. PT OT eval post surgical procedure. Orthopedic surgery was consulted who recommended THR, surgery cannot be performed on 12/11/2022, n.p.o. hopefully surgery will happen today. Cont on a bowel regiment.  Hyponatremia: Secondary to chronic diarrhea, on sodium tablets and cholestyramine. Improving with IV fluids. She usually ranges from 126-1 31.  Uncontrolled type 2 diabetes mellitus with hyperglycemia, with long-term current use  of insulin (HCC) Currently n.p.o., continue CBGs Q4.  Continue sliding scale. Blood glucose fairly controlled.  Mixed hyperlipidemia Continue cholestyramine.  ANEMIA-NOS Hemoglobin like around 9-10 that is her baseline unchanged.  Essential hypertension Continue verapamil.  Essential tremors: Continue primidone.  GERD: Continue Protonix.  Seizure disorder (HCC) Continue phenytoin  Acquired hypothyroidism Continue Synthroid  Hyponatremia Continue inhalers.   DVT prophylaxis: Lovenox Family Communication:none Status is: Inpatient Remains inpatient appropriate because: Acute closed hip fracture    Code Status:     Code Status Orders  (From admission, onward)           Start     Ordered    12/09/22 1549  Full code  Continuous       Question:  By:  Answer:  Consent: discussion documented in EHR   12/09/22 1552           Code Status History     Date Active Date Inactive Code Status Order ID Comments User Context   12/09/2022 1552 12/09/2022 1552 Full Code [id number]  Bobette Mo, MD ED   10/24/2022 1415 11/01/2022 2012 Full Code [id number]  Leatha Gilding, MD ED   01/24/2022 0245 01/29/2022 1730 Full Code [id number]  Briscoe Deutscher, MD ED   12/21/2021 1446 12/22/2021 2154 Full Code [id number]  Belva Agee, MD ED   04/03/2021 1415 04/08/2021 1737 Full Code [id number]  Darlin Drop, DO ED   08/17/2020 0338 08/22/2020 2001 Full Code [id number]  Eduard Clos, MD ED   06/04/2020 1322 06/11/2020 0538 Full Code [id number]  Vassie Loll, MD ED   04/01/2020 0654 04/02/2020 1735 Full Code [id number]  Shalhoub, Deno Lunger, MD ED   03/12/2020 2239 03/21/2020 1909 Full Code [id number]  Gaynelle Adu, MD ED   03/06/2020 1451 03/12/2020 1720 Full Code [id number]  Andria Meuse, MD Inpatient   10/26/2018 0153 10/29/2018 1938 Full Code [id number]  Hugelmeyer, Hitchita, DO Inpatient   07/19/2014 1107 07/20/2014 0335 Full Code [id number]  Oley Balm, MD HOV   10/18/2013 1942 10/27/2013 2023 Full Code [id number]  Karn Cassis, MD Inpatient   10/09/2013 2217 10/18/2013 1942 Full Code [id number]  Rama, Maryruth Bun, MD Inpatient         IV Access:   Peripheral IV   Procedures and diagnostic studies:   DG Knee Left Port  Result Date: 12/11/2022 CLINICAL DATA:  Closed displaced fracture of left femoral  neck after fall. Left knee pain. EXAM: PORTABLE LEFT KNEE - 1-2 VIEW COMPARISON:  Left knee radiographs 05/05/2022 FINDINGS: Diffuse decreased bone mineralization. Moderate to severe medial compartment joint space narrowing. Mild lateral compartment joint space narrowing. Mild-to-moderate peripheral degenerative spurring. Severe patellofemoral joint space narrowing. No acute fracture is  seen. No dislocation. IMPRESSION: 1. No acute fracture is seen. 2. 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 Electronically Signed   By: Neita Garnet M.D.   On: 12/11/2022 13:44   ECHOCARDIOGRAM COMPLETE  Result Date: 12/10/2022    ECHOCARDIOGRAM REPORT   Patient Name:   [name]  Date of Exam: 12/10/2022 Medical Rec #:  [id number]  Height:       67.0 in Accession #:    [id number] Weight:       143.3 lb Date of Birth:  January 19, 1930 BSA:          1.755 m Patient Age:    89 years   BP:           148/65 mmHg Patient Gender: F          HR:           70 bpm. Exam Location:  Inpatient Procedure: 2D Echo, Cardiac Doppler and Color Doppler Indications:    Preoperative evaluation, abnormal EKG  History:        Patient has prior history of Echocardiogram examinations, most                 recent 12/14/2019. Carotid Disease; Risk Factors:Dyslipidemia,                 Hypertension and Diabetes. Seizure disorderm hx cancer,                 respiratory failure, hx PE.  Sonographer:    Wallie Char Referring Phys: [id number] DAVID MANUEL ORTIZ IMPRESSIONS  1. Left ventricular ejection fraction, by estimation, is 65 to 70%. The left ventricle has normal function. The left ventricle has no regional wall motion abnormalities. Left ventricular diastolic parameters are consistent with Grade I diastolic dysfunction (impaired relaxation).  2. LV intracavitary gradient of 21 mmHg due to hyperdynamic ventricle.  3. Right ventricular systolic function is normal. The right ventricular size is normal. Tricuspid regurgitation signal is inadequate for assessing PA pressure.  4. The mitral valve is grossly normal. Trivial mitral valve regurgitation. No evidence of mitral stenosis.  5. The aortic valve is tricuspid. There is mild calcification of the aortic valve. Aortic valve regurgitation is not visualized. No aortic stenosis is present. FINDINGS  Left Ventricle: Left ventricular ejection fraction, by estimation, is 65 to 70%.  The left ventricle has normal function. The left ventricle has no regional wall motion abnormalities. The left ventricular internal cavity size was normal in size. There is  no left ventricular hypertrophy. Left ventricular diastolic parameters are consistent with Grade I diastolic dysfunction (impaired relaxation). Right Ventricle: The right ventricular size is normal. No increase in right ventricular wall thickness. Right ventricular systolic function is normal. Tricuspid regurgitation signal is inadequate for assessing PA pressure. Left Atrium: Left atrial size was normal in size. Right Atrium: Right atrial size was normal in size. Pericardium: There is no evidence of pericardial effusion. Presence of epicardial fat layer. Mitral Valve: The mitral valve is grossly normal. Trivial mitral valve regurgitation. No evidence of mitral valve stenosis. MV peak gradient, 3.7 mmHg. The mean mitral valve gradient is 2.0 mmHg. Tricuspid Valve: The tricuspid valve is normal in structure.  Tricuspid valve regurgitation is trivial. No evidence of tricuspid stenosis. Aortic Valve: The aortic valve is tricuspid. There is mild calcification of the aortic valve. Aortic valve regurgitation is not visualized. No aortic stenosis is present. Aortic valve mean gradient measures 4.0 mmHg. Aortic valve peak gradient measures 7.7 mmHg. Aortic valve area, by VTI measures 2.21 cm. Pulmonic Valve: The pulmonic valve was not well visualized. Pulmonic valve regurgitation is not visualized. No evidence of pulmonic stenosis. Aorta: The aortic root is normal in size and structure. Venous: The inferior vena cava was not well visualized. IAS/Shunts: No atrial level shunt detected by color flow Doppler.  LEFT VENTRICLE PLAX 2D LVIDd:         3.90 cm     Diastology LVIDs:         2.40 cm     LV e' medial:    7.62 cm/s LV PW:         0.80 cm     LV E/e' medial:  9.5 LV IVS:        1.30 cm     LV e' lateral:   6.75 cm/s LVOT diam:     1.90 cm     LV  E/e' lateral: 10.8 LV SV:         58 LV SV Index:   33 LVOT Area:     2.84 cm  LV Volumes (MOD) LV vol d, MOD A2C: 56.5 ml LV vol d, MOD A4C: 63.0 ml LV vol s, MOD A2C: 14.4 ml LV vol s, MOD A4C: 16.3 ml LV SV MOD A2C:     42.1 ml LV SV MOD A4C:     63.0 ml LV SV MOD BP:      44.1 ml RIGHT VENTRICLE RV Basal diam:  2.60 cm RV S prime:     14.80 cm/s TAPSE (M-mode): 1.5 cm LEFT ATRIUM             Index        RIGHT ATRIUM          Index LA diam:        4.80 cm 2.73 cm/m   RA Area:     9.31 cm LA Vol (A2C):   45.7 ml 26.04 ml/m  RA Volume:   15.80 ml 9.00 ml/m LA Vol (A4C):   54.1 ml 30.82 ml/m LA Biplane Vol: 50.8 ml 28.94 ml/m  AORTIC VALVE AV Area (Vmax):    2.12 cm AV Area (Vmean):   2.15 cm AV Area (VTI):     2.21 cm AV Vmax:           139.00 cm/s AV Vmean:          97.600 cm/s AV VTI:            0.262 m AV Peak Grad:      7.7 mmHg AV Mean Grad:      4.0 mmHg LVOT Vmax:         104.00 cm/s LVOT Vmean:        73.950 cm/s LVOT VTI:          0.205 m LVOT/AV VTI ratio: 0.78  AORTA Ao Root diam: 3.30 cm Ao Asc diam:  3.30 cm MITRAL VALVE MV Area (PHT): 2.36 cm    SHUNTS MV Area VTI:   2.22 cm    Systemic VTI:  0.20 m MV Peak grad:  3.7 mmHg    Systemic Diam: 1.90 cm MV Mean grad:  2.0 mmHg MV Vmax:  0.97 m/s MV Vmean:      56.3 cm/s MV Decel Time: 322 msec MV E velocity: 72.70 cm/s MV A velocity: 92.40 cm/s MV E/A ratio:  0.79 Weston Brass MD Electronically signed by Weston Brass MD Signature Date/Time: 12/10/2022/2:26:30 PM    Final      Medical Consultants:   None.   Subjective:    [place] pain is controlled.  Objective:    Vitals:   12/11/22 0907 12/11/22 1238 12/11/22 1946 12/12/22 0416  BP: (!) 140/69 (!) 147/63 (!) 148/67 135/65  Pulse: 79 83 99 97  Resp:  16 15   Temp:  99 F (37.2 C) 98.4 F (36.9 C) 98.7 F (37.1 C)  TempSrc:  Oral Oral Oral  SpO2:  100% 97% 99%  Weight:      Height:       SpO2: 99 % O2 Flow Rate (L/min): 3 L/min   Intake/Output Summary  (Last 24 hours) at 12/12/2022 0817 Last data filed at 12/12/2022 9629 Gross per 24 hour  Intake 777 ml  Output 800 ml  Net -23 ml   Filed Weights   12/09/22 1306  Weight: 65 kg    Exam: General exam: In no acute distress. Respiratory system: Good air movement and clear to auscultation. Cardiovascular system: S1 & S2 heard, RRR. No JVD. Gastrointestinal system: Abdomen is nondistended, soft and nontender.  Extremities: No pedal edema. Skin: No rashes, lesions or ulcers Psychiatry: Judgement and insight appear normal. Mood & affect appropriate.  Data Reviewed:    Labs: Basic Metabolic Panel: Recent Labs  Lab 12/09/22 1319 12/10/22 0413  NA 125* 127*  K 4.4 3.7  CL 94* 98  CO2 21* 19*  GLUCOSE 180* 146*  BUN 13 9  CREATININE 0.60 0.53  CALCIUM 8.5* 7.6*   GFR Estimated Creatinine Clearance: 45.5 mL/min (by C-G formula based on SCr of 0.53 mg/dL). Liver Function Tests: Recent Labs  Lab 12/10/22 0413  AST 23  ALT 14  ALKPHOS 75  BILITOT 0.3  PROT 5.2*  ALBUMIN 2.6*   No results for input(s): "LIPASE", "AMYLASE" in the last 168 hours. No results for input(s): "AMMONIA" in the last 168 hours. Coagulation profile Recent Labs  Lab 12/09/22 1319  INR 1.0   COVID-19 Labs  No results for input(s): "DDIMER", "FERRITIN", "LDH", "CRP" in the last 72 hours.  Lab Results  Component Value Date   SARSCOV2NAA NEGATIVE 04/07/2021   SARSCOV2NAA NEGATIVE 04/03/2021   SARSCOV2NAA NEGATIVE 03/30/2021   SARSCOV2NAA NEGATIVE 08/17/2020    CBC: Recent Labs  Lab 12/09/22 1319 12/10/22 0413  WBC 8.3 10.1  NEUTROABS 6.6  --   HGB 9.7* 9.9*  HCT 30.7* 30.8*  MCV 93.3 92.2  PLT 293 315   Cardiac Enzymes: No results for input(s): "CKTOTAL", "CKMB", "CKMBINDEX", "TROPONINI" in the last 168 hours. BNP (last 3 results) No results for input(s): "PROBNP" in the last 8760 hours. CBG: Recent Labs  Lab 12/11/22 0800 12/11/22 1441 12/11/22 1616 12/11/22 2053  12/12/22 0804  GLUCAP 101* 102* 107* 235* 171*   D-Dimer: No results for input(s): "DDIMER" in the last 72 hours. Hgb A1c: No results for input(s): "HGBA1C" in the last 72 hours. Lipid Profile: No results for input(s): "CHOL", "HDL", "LDLCALC", "TRIG", "CHOLHDL", "LDLDIRECT" in the last 72 hours. Thyroid function studies: No results for input(s): "TSH", "T4TOTAL", "T3FREE", "THYROIDAB" in the last 72 hours.  Invalid input(s): "FREET3" Anemia work up: No results for input(s): "VITAMINB12", "FOLATE", "FERRITIN", "TIBC", "IRON", "RETICCTPCT" in  the last 72 hours. Sepsis Labs: Recent Labs  Lab 12/09/22 1319 12/10/22 0413  WBC 8.3 10.1   Microbiology Recent Results (from the past 240 hour(s))  Surgical PCR screen     Status: None   Collection Time: 12/11/22  9:31 PM   Specimen: Nasal Mucosa; Nasal Swab  Result Value Ref Range Status   MRSA, PCR NEGATIVE NEGATIVE Final   Staphylococcus aureus NEGATIVE NEGATIVE Final    Comment: (NOTE) The Xpert SA Assay (FDA approved for NASAL specimens in patients 86 years of age and older), is one component of a comprehensive surveillance program. It is not intended to diagnose infection nor to guide or monitor treatment. Performed at [facility], [address] [place], [place] [address]      Medications:    acetaminophen  1,000 mg Oral On Call to OR   Chlorhexidine Gluconate Cloth  1 each Topical On Call to OR   cholestyramine light  4 g Oral BID   cyanocobalamin  1,000 mcg Intramuscular Q21 days   feeding supplement  1 Container Oral BID BM   fentaNYL (SUBLIMAZE) injection  50-100 mcg Intravenous UD   insulin aspart  0-9 Units Subcutaneous TID WC   insulin glargine-yfgn  10 Units Subcutaneous QHS   levothyroxine  75 mcg Oral QAC breakfast   mupirocin ointment  1 Application Nasal BID   pantoprazole  40 mg Oral Daily   phenytoin  50 mg Oral Daily   phenytoin  300 mg Oral QHS   polyethylene glycol  17 g Oral  BID   povidone-iodine  2 Application Topical On Call to OR   povidone-iodine  2 Application Topical On Call to OR   primidone  150 mg Oral QHS   primidone  250 mg Oral Daily   sodium chloride  2 g Oral BID WC   verapamil  180 mg Oral Daily   Continuous Infusions:  tranexamic acid     vancomycin         LOS: 3 days   Marinda Elk  [facility]  12/12/2022, 8:17 AM

## 2022-12-12 NOTE — Anesthesia Postprocedure Evaluation (Signed)
Anesthesia Post Note  Patient: Rotunda [place]  Procedure(s) Performed: TOTAL HIP ARTHROPLASTY ANTERIOR APPROACH (Left: Hip)     Patient location during evaluation: PACU Anesthesia Type: General Level of consciousness: awake and alert Pain management: pain level controlled Vital Signs Assessment: post-procedure vital signs reviewed and stable Respiratory status: spontaneous breathing, nonlabored ventilation, respiratory function stable and patient connected to nasal cannula oxygen Cardiovascular status: blood pressure returned to baseline and stable Postop Assessment: no apparent nausea or vomiting Anesthetic complications: no  No notable events documented.  Last Vitals:  Vitals:   12/12/22 1445 12/12/22 1517  BP: 128/69 134/69  Pulse: 95 94  Resp: 18 17  Temp:  36.4 C  SpO2: 94% 98%    Last Pain:  Vitals:   12/12/22 1517  TempSrc: Oral  PainSc:                  Shelton Silvas

## 2022-12-12 NOTE — Transfer of Care (Signed)
Immediate Anesthesia Transfer of Care Note  Patient: [name]  Procedure(s) Performed: TOTAL HIP ARTHROPLASTY ANTERIOR APPROACH (Left: Hip)  Patient Location: PACU  Anesthesia Type:General  Level of Consciousness: drowsy and patient cooperative  Airway & Oxygen Therapy: Patient Spontanous Breathing and Patient connected to face mask oxygen  Post-op Assessment: Report given to RN and Post -op Vital signs reviewed and stable  Post vital signs: Reviewed and stable  Last Vitals:  Vitals Value Taken Time  BP    Temp    Pulse    Resp    SpO2      Last Pain:  Vitals:   12/12/22 0935  TempSrc:   PainSc: 10-Worst pain ever      Patients Stated Pain Goal: 6 (12/11/22 1316)  Complications: No notable events documented.

## 2022-12-12 NOTE — Anesthesia Preprocedure Evaluation (Addendum)
Anesthesia Evaluation  Patient identified by MRN, date of birth, ID band Patient awake    Reviewed: Allergy & Precautions, NPO status , Patient's Chart, lab work & pertinent test results  Airway Mallampati: II  TM Distance: >3 FB Neck ROM: Full    Dental  (+) Caps, Dental Advisory Given   Pulmonary asthma    breath sounds clear to auscultation       Cardiovascular hypertension,  Rhythm:Regular Rate:Normal  Echo:  1. Left ventricular ejection fraction, by estimation, is 65 to 70%. The  left ventricle has normal function. The left ventricle has no regional  wall motion abnormalities. Left ventricular diastolic parameters are  consistent with Grade I diastolic  dysfunction (impaired relaxation).   2. LV intracavitary gradient of 21 mmHg due to hyperdynamic ventricle.   3. Right ventricular systolic function is normal. The right ventricular  size is normal. Tricuspid regurgitation signal is inadequate for assessing  PA pressure.   4. The mitral valve is grossly normal. Trivial mitral valve  regurgitation. No evidence of mitral stenosis.   5. The aortic valve is tricuspid. There is mild calcification of the  aortic valve. Aortic valve regurgitation is not visualized. No aortic  stenosis is present.      Neuro/Psych Seizures -, Well Controlled,   Anxiety        GI/Hepatic Neg liver ROS,GERD  ,,  Endo/Other  diabetesHypothyroidism    Renal/GU      Musculoskeletal  (+) Arthritis ,    Abdominal   Peds  Hematology   Anesthesia Other Findings   Reproductive/Obstetrics                             Anesthesia Physical Anesthesia Plan  ASA: 3 and emergent  Anesthesia Plan: General   Post-op Pain Management: Tylenol PO (pre-op)*   Induction: Intravenous  PONV Risk Score and Plan: 4 or greater and Ondansetron, Treatment Kasik vary due to age or medical condition and TIVA  Airway Management  Planned: Oral ETT  Additional Equipment: None  Intra-op Plan:   Post-operative Plan: Extubation in OR  Informed Consent: I have reviewed the patients History and Physical, chart, labs and discussed the procedure including the risks, benefits and alternatives for the proposed anesthesia with the patient or authorized representative who has indicated his/her understanding and acceptance.     Dental advisory given  Plan Discussed with: CRNA  Anesthesia Plan Comments:        Anesthesia Quick Evaluation

## 2022-12-12 NOTE — Progress Notes (Signed)
Pt brought back from surgery accompanied by 2 PACU RN, Sleepy but arousable enough to respond to questions, vitals stable, currently denies pain, surgical site noted to have post op dressing intact with some bruises around the bandage. staff will continue to monitor pt closely

## 2022-12-12 NOTE — Op Note (Signed)
OPERATIVE REPORT  SURGEON: Samson Frederic, MD   ASSISTANT: Staff.  PREOPERATIVE DIAGNOSIS: Displaced Left femoral neck fracture.   POSTOPERATIVE DIAGNOSIS: Displaced Left femoral neck fracture with intertrochanteric extension.   PROCEDURE: Left total hip arthroplasty, anterior approach.   IMPLANTS: Biomet Arcos One Piece stem, size 20 x 175 mm, high offset. Biomet G7 OsseoTi Cup, size 52 mm. Biomet Vivacit-E liner, size 36 mm, E, neutral. Biomet metal head ball, size 36 + 0 mm. 1.8 mm adult reconstruction cable x2.  ANESTHESIA:  General  ANTIBIOTICS: 1g vancomycin.  ESTIMATED BLOOD LOSS:-300 mL    DRAINS: None.  COMPLICATIONS: None   CONDITION: PACU - hemodynamically stable.   BRIEF CLINICAL NOTE: [name] is a 89 y.o. female with a displaced Left femoral neck fracture. The patient was admitted to the hospitalist service and underwent perioperative risk stratification and medical optimization. The risks, benefits, and alternatives to total hip arthroplasty were explained, and the patient elected to proceed.  PROCEDURE IN DETAIL: The patient was taken to the operating room and general anesthesia was induced on the hospital bed.  The patient was then positioned on the Hana table.  All bony prominences were well padded.  The hip was prepped and draped in the normal sterile surgical fashion.  A time-out was called verifying side and site of surgery. Antibiotics were given within 60 minutes of beginning the procedure.   Bikini incision was made, and the direct anterior approach to the hip was performed through the Hueter interval.  Lateral femoral circumflex vessels were treated with the Auqumantys. The anterior capsule was exposed and an inverted T capsulotomy was made.  Fracture hematoma was encountered and evacuated. The patient was found to have a comminuted Left basicervical femoral neck fracture with intertrochanteric extension.  I freshened the femoral neck cut with a saw.  I  removed the femoral neck fragment.  A corkscrew was placed into the head and the head was removed.  This was passed to the back table and was measured. The pubofemoral ligament was released subperiosteally to the lesser trochanter. I placed a subperiosteal figure of 8 adult reconstruction cable just distal to the lesser trochanter and anterior to the abductor tendon insertion. I also placed a prophylactic adult reconstruction cable just proximal to the lesser trochanter.   Acetabular exposure was achieved, and the pulvinar and labrum were excised. Sequential reaming of the acetabulum was then performed up to a size 51 mm reamer under direct visulization. A 52 mm cup was then opened and impacted into place at approximately 40 degrees of abduction and 20 degrees of anteversion. The final polyethylene liner was impacted into place and acetabular osteophytes were removed.    I then gained femoral exposure taking care to protect the abductors and greater trochanter.  This was performed using standard external rotation, extension, and adduction.  A cookie cutter was used to enter the femoral canal, and then the femoral canal finder was placed.  Sequential reaming and broaching was performed up to a size 20.  Calcar planer was used on the femoral neck remnant.  I placed a high offset neck and a trial head ball.  The hip was reduced.  Leg lengths and offset were checked fluoroscopically.  The hip was dislocated and trial components were removed.  The final implants were placed, and the hip was reduced.  The cables were tightened and clipped. Fluoroscopy was used to confirm component position and leg lengths.  At 90 degrees of external rotation and full extension,  the hip was stable to an anterior directed force.   The wound was copiously irrigated with Prontosan solution and normal saline using pulse lavage.  Marcaine solution was injected into the periarticular soft tissue.  The wound was closed in layers using #1  Stratafix for the fascia, 2-0 Vicryl for the subcutaneous fat, 2-0 Monocryl for the deep dermal layer, and staples + Dermabond for the skin.  Once the glue was fully dried, an Aquacell Ag dressing was applied.  The patient was transported to the recovery room in stable condition.  Sponge, needle, and instrument counts were correct at the end of the case x2.  The patient tolerated the procedure well and there were no known complications.

## 2022-12-12 NOTE — Discharge Instructions (Signed)
? ?Dr. Brian Swinteck ?Joint Replacement Specialist ?[facility] ?[address] ?[place], [place] [address] ?[phone] ? ? ?TOTAL HIP REPLACEMENT POSTOPERATIVE DIRECTIONS ? ? ? ?Hip Rehabilitation, Guidelines Following Surgery  ? ?WEIGHT BEARING ?Weight bearing as tolerated with assist device (walker, cane, etc) as directed, use it as long as suggested by your surgeon or therapist, typically at least 4-6 weeks. ? ?The results of a hip operation are greatly improved after range of motion and muscle strengthening exercises. Follow all safety measures which are given to protect your hip. If any of these exercises cause increased pain or swelling in your joint, decrease the amount until you are comfortable again. Then slowly increase the exercises. Call your caregiver if you have problems or questions.  ? ?HOME CARE INSTRUCTIONS  ?Most of the following instructions are designed to prevent the dislocation of your new hip.  ?Remove items at home which could result in a fall. This includes throw rugs or furniture in walking pathways.  ?Continue medications as instructed at time of discharge. ?You may have some home medications which will be placed on hold until you complete the course of blood thinner medication. ?You may start showering once you are discharged home. Do not remove your dressing. ?Do not put on socks or shoes without following the instructions of your caregivers.   ?Sit on chairs with arms. Use the chair arms to help push yourself up when arising.  ?Arrange for the use of a toilet seat elevator so you are not sitting low.  ?Walk with walker as instructed.  ?You may resume a sexual relationship in one month or when given the OK by your caregiver.  ?Use walker as long as suggested by your caregivers.  ?You may put full weight on your legs and walk as much as is comfortable. ?Avoid periods of inactivity such as sitting longer than an hour when not asleep. This helps prevent blood  clots.  ?You may return to work once you are cleared by your surgeon.  ?Do not drive a car for 6 weeks or until released by your surgeon.  ?Do not drive while taking narcotics.  ?Wear elastic stockings for two weeks following surgery during the day but you may remove then at night.  ?Make sure you keep all of your appointments after your operation with all of your doctors and caregivers. You should call the office at the above phone number and make an appointment for approximately two weeks after the date of your surgery. ?Please pick up a stool softener and laxative for home use as long as you are requiring pain medications. ?ICE to the affected hip every three hours for 30 minutes at a time and then as needed for pain and swelling. Continue to use ice on the hip for pain and swelling from surgery. You may notice swelling that will progress down to the foot and ankle.  This is normal after surgery.  Elevate the leg when you are not up walking on it.   ?It is important for you to complete the blood thinner medication as prescribed by your doctor. ?Continue to use the breathing machine which will help keep your temperature down.  It is common for your temperature to cycle up and down following surgery, especially at night when you are not up moving around and exerting yourself.  The breathing machine keeps your lungs expanded and your temperature down. ? ?RANGE OF MOTION AND STRENGTHENING EXERCISES  ?These exercises are designed to help you   keep full movement of your hip joint. Follow your caregiver's or physical therapist's instructions. Perform all exercises about fifteen times, three times per day or as directed. Exercise both hips, even if you have had only one joint replacement. These exercises can be done on a training (exercise) mat, on the floor, on a table or on a bed. Use whatever works the best and is most comfortable for you. Use music or television while you are exercising so that the exercises are a  pleasant break in your day. This will make your life better with the exercises acting as a break in routine you can look forward to.  ?Lying on your back, slowly slide your foot toward your buttocks, raising your knee up off the floor. Then slowly slide your foot back down until your leg is straight again.  ?Lying on your back spread your legs as far apart as you can without causing discomfort.  ?Lying on your side, raise your upper leg and foot straight up from the floor as far as is comfortable. Slowly lower the leg and repeat.  ?Lying on your back, tighten up the muscle in the front of your thigh (quadriceps muscles). You can do this by keeping your leg straight and trying to raise your heel off the floor. This helps strengthen the largest muscle supporting your knee.  ?Lying on your back, tighten up the muscles of your buttocks both with the legs straight and with the knee bent at a comfortable angle while keeping your heel on the floor.  ? ?SKILLED REHAB INSTRUCTIONS: ?If the patient is transferred to a skilled rehab facility following release from the hospital, a list of the current medications will be sent to the facility for the patient to continue.  When discharged from the skilled rehab facility, please have the facility set up the patient's Home Health Physical Therapy prior to being released. Also, the skilled facility will be responsible for providing the patient with their medications at time of release from the facility to include their pain medication and their blood thinner medication. If the patient is still at the rehab facility at time of the two week follow up appointment, the skilled rehab facility will also need to assist the patient in arranging follow up appointment in our office and any transportation needs. ? ?POST-OPERATIVE OPIOID TAPER INSTRUCTIONS: ?It is important to wean off of your opioid medication as soon as possible. If you do not need pain medication after your surgery it is ok  to stop day one. ?Opioids include: ?Codeine, Hydrocodone(Norco, Vicodin), Oxycodone(Percocet, oxycontin) and hydromorphone amongst others.  ?Long term and even short term use of opiods can cause: ?Increased pain response ?Dependence ?Constipation ?Depression ?Respiratory depression ?And more.  ?Withdrawal symptoms can include ?Flu like symptoms ?Nausea, vomiting ?And more ?Techniques to manage these symptoms ?Hydrate well ?Eat regular healthy meals ?Stay active ?Use relaxation techniques(deep breathing, meditating, yoga) ?Do Not substitute Alcohol to help with tapering ?If you have been on opioids for less than two weeks and do not have pain than it is ok to stop all together.  ?Plan to wean off of opioids ?This plan should start within one week post op of your joint replacement. ?Maintain the same interval or time between taking each dose and first decrease the dose.  ?Cut the total daily intake of opioids by one tablet each day ?Next start to increase the time between doses. ?The last dose that should be eliminated is the evening dose.  ? ? ?MAKE   SURE YOU:  ?Understand these instructions.  ?Will watch your condition.  ?Will get help right away if you are not doing well or get worse. ? ?Pick up stool softner and laxative for home use following surgery while on pain medications. ?Do not remove your dressing. ?The dressing is waterproof--it is OK to take showers. ?Continue to use ice for pain and swelling after surgery. ?Do not use any lotions or creams on the incision until instructed by your surgeon. ?Total Hip Protocol. ? ?

## 2022-12-12 NOTE — Anesthesia Procedure Notes (Signed)
Procedure Name: Intubation Date/Time: 12/12/2022 11:39 AM  Performed by: Epimenio Sarin, CRNAPre-anesthesia Checklist: Patient identified, Emergency Drugs available, Suction available, Patient being monitored and Timeout performed Patient Re-evaluated:Patient Re-evaluated prior to induction Oxygen Delivery Method: Circle system utilized Preoxygenation: Pre-oxygenation with 100% oxygen Induction Type: IV induction Ventilation: Mask ventilation without difficulty and Oral airway inserted - appropriate to patient size Laryngoscope Size: Mac and 3 Grade View: Grade I Tube type: Oral Tube size: 7.0 mm Number of attempts: 1 Airway Equipment and Method: Stylet Placement Confirmation: ETT inserted through vocal cords under direct vision, positive ETCO2 and breath sounds checked- equal and bilateral Secured at: 21 cm Tube secured with: Tape Dental Injury: Teeth and Oropharynx as per pre-operative assessment

## 2022-12-12 NOTE — Plan of Care (Signed)
  Problem: Skin Integrity: Goal: Risk for impaired skin integrity will decrease Outcome: Progressing   Problem: Safety: Goal: Ability to remain free from injury will improve Outcome: Progressing   Problem: Pain Managment: Goal: General experience of comfort will improve Outcome: Progressing   Problem: Coping: Goal: Level of anxiety will decrease Outcome: Progressing   

## 2022-12-13 DIAGNOSIS — S72002A Fracture of unspecified part of neck of left femur, initial encounter for closed fracture: Secondary | ICD-10-CM | POA: Diagnosis not present

## 2022-12-13 DIAGNOSIS — E871 Hypo-osmolality and hyponatremia: Secondary | ICD-10-CM | POA: Diagnosis not present

## 2022-12-13 DIAGNOSIS — I10 Essential (primary) hypertension: Secondary | ICD-10-CM | POA: Diagnosis not present

## 2022-12-13 LAB — BASIC METABOLIC PANEL WITH GFR
Anion gap: 8 (ref 5–15)
BUN: 9 mg/dL (ref 8–23)
CO2: 19 mmol/L — ABNORMAL LOW (ref 22–32)
Calcium: 7.7 mg/dL — ABNORMAL LOW (ref 8.9–10.3)
Chloride: 100 mmol/L (ref 98–111)
Creatinine, Ser: 0.64 mg/dL (ref 0.44–1.00)
GFR, Estimated: >60 mL/min (ref >60)
Glucose, Bld: 181 mg/dL — ABNORMAL HIGH (ref 70–99)
Potassium: 3.9 mmol/L (ref 3.5–5.1)
Sodium: 127 mmol/L — ABNORMAL LOW (ref 135–145)

## 2022-12-13 LAB — GLUCOSE, CAPILLARY
Glucose-Capillary: 166 mg/dL — ABNORMAL HIGH (ref 70–99)
Glucose-Capillary: 132 mg/dL — ABNORMAL HIGH (ref 70–99)
Glucose-Capillary: 254 mg/dL — ABNORMAL HIGH (ref 70–99)
Glucose-Capillary: 176 mg/dL — ABNORMAL HIGH (ref 70–99)

## 2022-12-13 LAB — CBC
HCT: 21.9 % — ABNORMAL LOW (ref 36.0–46.0)
Hemoglobin: 7.1 g/dL — ABNORMAL LOW (ref 12.0–15.0)
MCH: 30.3 pg (ref 26.0–34.0)
MCHC: 32.4 g/dL (ref 30.0–36.0)
MCV: 93.6 fL (ref 80.0–100.0)
Platelets: 252 10*3/uL (ref 150–400)
RBC: 2.34 MIL/uL — ABNORMAL LOW (ref 3.87–5.11)
RDW: 14.3 % (ref 11.5–15.5)
WBC: 11.2 10*3/uL — ABNORMAL HIGH (ref 4.0–10.5)
nRBC: 0 % (ref 0.0–0.2)

## 2022-12-13 MED ORDER — POLYETHYLENE GLYCOL 3350 17 G PO PACK
17.0000 g | PACK | Freq: Two times a day (BID) | ORAL | Status: AC
  Administered 2022-12-13 – 2022-12-14 (×3): 17 g via ORAL
  Filled 2022-12-13 (×2): qty 1

## 2022-12-13 MED ORDER — INSULIN ASPART 100 UNIT/ML IJ SOLN
0.0000 [IU] | Freq: Three times a day (TID) | INTRAMUSCULAR | Status: DC
  Administered 2022-12-13: 5 [IU] via SUBCUTANEOUS
  Administered 2022-12-13: 1 [IU] via SUBCUTANEOUS
  Administered 2022-12-14 (×3): 2 [IU] via SUBCUTANEOUS
  Administered 2022-12-15: 1 [IU] via SUBCUTANEOUS
  Administered 2022-12-15 (×2): 2 [IU] via SUBCUTANEOUS

## 2022-12-13 MED ORDER — INSULIN ASPART 100 UNIT/ML IJ SOLN: 0.0000 [IU] | Freq: Every day | INTRAMUSCULAR | Status: DC

## 2022-12-13 MED ORDER — INSULIN ASPART 100 UNIT/ML IJ SOLN
3.0000 [IU] | Freq: Three times a day (TID) | INTRAMUSCULAR | Status: DC
  Administered 2022-12-13 – 2022-12-14 (×5): 3 [IU] via SUBCUTANEOUS

## 2022-12-13 MED ORDER — ORAL CARE MOUTH RINSE: 15.0000 mL | OROMUCOSAL | Status: DC | PRN

## 2022-12-13 NOTE — Plan of Care (Signed)
  Problem: Pain Management: Goal: Pain level will decrease with appropriate interventions Outcome: Progressing   Problem: Skin Integrity: Goal: Will show signs of wound healing Outcome: Progressing   Problem: Clinical Measurements: Goal: Postoperative complications will be avoided or minimized Outcome: Progressing   Problem: Safety: Goal: Ability to remain free from injury will improve Outcome: Progressing

## 2022-12-13 NOTE — Progress Notes (Signed)
Patient ID: Chely Gambell Valbuena, female   DOB: [birth year], 89 y.o.   MRN: [id number] Subjective: 1 Day Post-Op Procedure(s) (LRB): TOTAL HIP ARTHROPLASTY ANTERIOR APPROACH (Left)    Patient reports pain as mild to moderate No events reported  Objective:   VITALS:   Vitals:   12/12/22 2010 12/13/22 0408  BP: 136/71 138/68  Pulse: 86 99  Resp: 17 18  Temp: 97.6 F (36.4 C) 98 F (36.7 C)  SpO2: 100% 93%    Neurovascular intact Incision: dressing C/D/I  LABS No results for input(s): "HGB", "HCT", "WBC", "PLT" in the last 72 hours.  No results for input(s): "NA", "K", "BUN", "CREATININE", "GLUCOSE" in the last 72 hours.  No results for input(s): "LABPT", "INR" in the last 72 hours.   Assessment/Plan: 1 Day Post-Op Procedure(s) (LRB): TOTAL HIP ARTHROPLASTY ANTERIOR APPROACH (Left)   Advance diet Up with therapy RTC in 2 weeks to see Swinteck

## 2022-12-13 NOTE — Progress Notes (Signed)
Niece, Carylon, called and given an update.

## 2022-12-13 NOTE — Progress Notes (Signed)
[facility] PROGRESS NOTE    Progress Note  Graclynn Quinnell Siragusa  WJX:[id number] DOB: 1929-12-28 DOA: 12/09/2022 PCP: Verlon Au, MD     Brief Narrative:   Twylah Nuding Gundrum is an 89 y.o. female past medical history significant for unspecified anemia, asthma/chronic bronchitis, right breast cancer history of radiation, carotid artery stenosis, diabetes mellitus type 2, brought into the ED via EMS after a fall, hemoglobin was 9 white blood cell count 8.3 chest x-ray showed no acute findings she was complaining of left hip pain and imaging showed minimally displaced subcapital femoral neck fracture with repeat surgery was consulted   Assessment/Plan:   Closed left hip fracture, initial encounter (HCC) Continue analgesics and antiemetics. Orthopedic surgery was consulted who recommended THR, performed on 12/11/2022, n.p.o. Cont on a bowel regiment. Consult PT OT. Anticipate will need skilled nursing facility.  Hyponatremia: Secondary to chronic diarrhea, on sodium tablets and cholestyramine. Improving with IV fluids. She usually ranges from 126-1 31.  Uncontrolled type 2 diabetes mellitus with hyperglycemia, with long-term current use  of insulin (HCC) Continue CBGs ACHS.  Continue sliding scale. Blood glucose fairly controlled.  Mixed hyperlipidemia Continue cholestyramine.  ANEMIA-NOS Hemoglobin like around 9-10 that is her baseline unchanged.  Essential hypertension Continue verapamil.  Essential tremors: Continue primidone.  GERD: Continue Protonix.  Seizure disorder (HCC) Continue phenytoin  Acquired hypothyroidism Continue Synthroid  Hyponatremia Continue inhalers.   DVT prophylaxis: Lovenox Family Communication:none Status is: Inpatient Remains inpatient appropriate because: Acute closed hip fracture    Code Status:     Code Status Orders  (From admission, onward)           Start     Ordered   12/09/22 1549  Full code  Continuous        Question:  By:  Answer:  Consent: discussion documented in EHR   12/09/22 1552           Code Status History     Date Active Date Inactive Code Status Order ID Comments User Context   12/09/2022 1552 12/09/2022 1552 Full Code [id number]  Bobette Mo, MD ED   10/24/2022 1415 11/01/2022 2012 Full Code [id number]  Leatha Gilding, MD ED   01/24/2022 0245 01/29/2022 1730 Full Code [id number]  Briscoe Deutscher, MD ED   12/21/2021 1446 12/22/2021 2154 Full Code [id number]  Belva Agee, MD ED   04/03/2021 1415 04/08/2021 1737 Full Code [id number]  Darlin Drop, DO ED   08/17/2020 0338 08/22/2020 2001 Full Code [id number]  Eduard Clos, MD ED   06/04/2020 1322 06/11/2020 0538 Full Code [id number]  Vassie Loll, MD ED   04/01/2020 0654 04/02/2020 1735 Full Code [id number]  Shalhoub, Deno Lunger, MD ED   03/12/2020 2239 03/21/2020 1909 Full Code [id number]  Gaynelle Adu, MD ED   03/06/2020 1451 03/12/2020 1720 Full Code [id number]  Andria Meuse, MD Inpatient   10/26/2018 0153 10/29/2018 1938 Full Code [id number]  Hugelmeyer, Clayton, DO Inpatient   07/19/2014 1107 07/20/2014 0335 Full Code [id number]  Oley Balm, MD HOV   10/18/2013 1942 10/27/2013 2023 Full Code [id number]  Karn Cassis, MD Inpatient   10/09/2013 2217 10/18/2013 1942 Full Code [id number]  Rama, Maryruth Bun, MD Inpatient         IV Access:   Peripheral IV   Procedures and diagnostic studies:   DG HIP UNILAT WITH PELVIS 1V LEFT  Result Date: 12/12/2022 CLINICAL DATA:  Left hip arthroplasty EXAM: DG HIP (WITH OR WITHOUT PELVIS)  1V*L* COMPARISON:  Pelvis radiographs 12/09/2022 FINDINGS: Two C-arm fluoroscopic images were obtained intraoperatively and submitted for post operative interpretation. Postsurgical changes reflecting left hip arthroplasty with cerclage wire fixation are noted. There is no evidence of immediate complication. Fluoro time 28 seconds, dose 1.89 mGy. Please see the performing provider's procedural  report for further detail. IMPRESSION: Status post left hip arthroplasty without evidence of immediate complication. Electronically Signed   By: Lesia Hausen M.D.   On: 12/12/2022 15:17   DG Pelvis Portable  Result Date: 12/12/2022 CLINICAL DATA:  Status post hip surgery EXAM: PORTABLE PELVIS 1-2 VIEWS COMPARISON:  Pelvis radiographs 06/11/2022 FINDINGS: There has been interval left hip arthroplasty with cerclage wire fixation. Hardware alignment is within expected limits, without evidence of complication. There is expected surrounding postoperative soft tissue gas. Right hip arthroplasty hardware is stable, without evidence of complication. The SI joints and symphysis pubis are intact. IMPRESSION: Status post left hip arthroplasty without evidence of complication. Electronically Signed   By: Lesia Hausen M.D.   On: 12/12/2022 15:15   DG C-Arm 1-60 Min-No Report  Result Date: 12/12/2022 Fluoroscopy was utilized by the requesting physician.  No radiographic interpretation.   DG C-Arm 1-60 Min-No Report  Result Date: 12/12/2022 Fluoroscopy was utilized by the requesting physician.  No radiographic interpretation.   DG Knee Left Port  Result Date: 12/11/2022 CLINICAL DATA:  Closed displaced fracture of left femoral neck after fall. Left knee pain. EXAM: PORTABLE LEFT KNEE - 1-2 VIEW COMPARISON:  Left knee radiographs 05/05/2022 FINDINGS: Diffuse decreased bone mineralization. Moderate to severe medial compartment joint space narrowing. Mild lateral compartment joint space narrowing. Mild-to-moderate peripheral degenerative spurring. Severe patellofemoral joint space narrowing. No acute fracture is seen. No dislocation. IMPRESSION: 1. No acute fracture is seen. 2. 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 Electronically Signed   By: Neita Garnet M.D.   On: 12/11/2022 13:44     Medical Consultants:   None.   Subjective:    Valera Castle Boesel complaining of pain is not controlled, but  the patient was sleeping when I went to the room  Objective:    Vitals:   12/12/22 1445 12/12/22 1517 12/12/22 2010 12/13/22 0408  BP: 128/69 134/69 136/71 138/68  Pulse: 95 94 86 99  Resp: 18 17 17 18   Temp:  97.6 F (36.4 C) 97.6 F (36.4 C) 98 F (36.7 C)  TempSrc:  Oral Oral Oral  SpO2: 94% 98% 100% 93%  Weight:      Height:       SpO2: 93 % O2 Flow Rate (L/min): 2 L/min   Intake/Output Summary (Last 24 hours) at 12/13/2022 0840 Last data filed at 12/13/2022 0653 Gross per 24 hour  Intake 4158.01 ml  Output 775 ml  Net 3383.01 ml   Filed Weights   12/09/22 1306  Weight: 65 kg    Exam: General exam: In no acute distress. Respiratory system: Good air movement and clear to auscultation. Cardiovascular system: S1 & S2 heard, RRR. No JVD. Gastrointestinal system: Abdomen is nondistended, soft and nontender.  Extremities: No pedal edema. Skin: No rashes, lesions or ulcers Psychiatry: Judgement and insight appear normal. Mood & affect appropriate. Data Reviewed:    Labs: Basic Metabolic Panel: Recent Labs  Lab 12/09/22 1319 12/10/22 0413  NA 125* 127*  K 4.4 3.7  CL 94* 98  CO2 21* 19*  GLUCOSE 180* 146*  BUN 13 9  CREATININE 0.60 0.53  CALCIUM 8.5* 7.6*   GFR Estimated  Creatinine Clearance: 45.5 mL/min (by C-G formula based on SCr of 0.53 mg/dL). Liver Function Tests: Recent Labs  Lab 12/10/22 0413  AST 23  ALT 14  ALKPHOS 75  BILITOT 0.3  PROT 5.2*  ALBUMIN 2.6*   No results for input(s): "LIPASE", "AMYLASE" in the last 168 hours. No results for input(s): "AMMONIA" in the last 168 hours. Coagulation profile Recent Labs  Lab 12/09/22 1319  INR 1.0   COVID-19 Labs  No results for input(s): "DDIMER", "FERRITIN", "LDH", "CRP" in the last 72 hours.  Lab Results  Component Value Date   SARSCOV2NAA NEGATIVE 04/07/2021   SARSCOV2NAA NEGATIVE 04/03/2021   SARSCOV2NAA NEGATIVE 03/30/2021   SARSCOV2NAA NEGATIVE 08/17/2020    CBC: Recent  Labs  Lab 12/09/22 1319 12/10/22 0413  WBC 8.3 10.1  NEUTROABS 6.6  --   HGB 9.7* 9.9*  HCT 30.7* 30.8*  MCV 93.3 92.2  PLT 293 315   Cardiac Enzymes: No results for input(s): "CKTOTAL", "CKMB", "CKMBINDEX", "TROPONINI" in the last 168 hours. BNP (last 3 results) No results for input(s): "PROBNP" in the last 8760 hours. CBG: Recent Labs  Lab 12/12/22 1118 12/12/22 1418 12/12/22 1641 12/12/22 2050 12/13/22 0749  GLUCAP 155* 187* 230* 207* 166*   D-Dimer: No results for input(s): "DDIMER" in the last 72 hours. Hgb A1c: No results for input(s): "HGBA1C" in the last 72 hours. Lipid Profile: No results for input(s): "CHOL", "HDL", "LDLCALC", "TRIG", "CHOLHDL", "LDLDIRECT" in the last 72 hours. Thyroid function studies: No results for input(s): "TSH", "T4TOTAL", "T3FREE", "THYROIDAB" in the last 72 hours.  Invalid input(s): "FREET3" Anemia work up: No results for input(s): "VITAMINB12", "FOLATE", "FERRITIN", "TIBC", "IRON", "RETICCTPCT" in the last 72 hours. Sepsis Labs: Recent Labs  Lab 12/09/22 1319 12/10/22 0413  WBC 8.3 10.1   Microbiology Recent Results (from the past 240 hour(s))  Surgical PCR screen     Status: None   Collection Time: 12/11/22  9:31 PM   Specimen: Nasal Mucosa; Nasal Swab  Result Value Ref Range Status   MRSA, PCR NEGATIVE NEGATIVE Final   Staphylococcus aureus NEGATIVE NEGATIVE Final    Comment: (NOTE) The Xpert SA Assay (FDA approved for NASAL specimens in patients 67 years of age and older), is one component of a comprehensive surveillance program. It is not intended to diagnose infection nor to guide or monitor treatment. Performed at [facility], [address] [place], [place] [address]      Medications:    acetaminophen  1,000 mg Oral Q6H   apixaban  2.5 mg Oral Q12H   cholecalciferol  5,000 Units Oral Daily   cholestyramine light  4 g Oral BID   cyanocobalamin  1,000 mcg Intramuscular Q21 days    docusate sodium  100 mg Oral BID   insulin aspart  0-9 Units Subcutaneous TID WC   insulin glargine-yfgn  10 Units Subcutaneous QHS   levothyroxine  75 mcg Oral QAC breakfast   mupirocin ointment  1 Application Nasal BID   pantoprazole  40 mg Oral Daily   phenytoin  50 mg Oral Daily   phenytoin  300 mg Oral QHS   primidone  150 mg Oral QHS   primidone  250 mg Oral Daily   senna  1 tablet Oral BID   sodium chloride  2 g Oral BID WC   verapamil  180 mg Oral Daily   Continuous Infusions:  sodium chloride 150 mL/hr at 12/13/22 0419   methocarbamol (ROBAXIN) IV  LOS: 4 days   Marinda Elk  [facility]  12/13/2022, 8:40 AM

## 2022-12-13 NOTE — Evaluation (Signed)
Occupational Therapy Evaluation Patient Details Name: [name] Chain MRN: [id number] DOB: [birth year] Today's Date: 12/13/2022   History of Present Illness 89 yr old female admitted after fall and found to have with L femoral neck fracture now s/p L THA 12/12/22. Hx of R THA, breast cancer, anemia, esential tremor, osteoporosis, gait abnormality, anxiety, asthma, chronic pain, Sz, DM   Clinical Impression   Pt is with below listed deficits (see OT problem list) , compromising her ADL performance. She currently requires significant assist for all ADLs at bed level. Pt noted to be with intermittent drowsiness during session; pt also presented  with minimal tolerance for any activity, as she was noted to guard, grimace, and moan to light touch and most all activity attempts. Without further OT services, she is at risk for further weakness and deconditioning, as well as progressive functional decline. Patient will benefit from continued inpatient follow up therapy, <3 hours/day.       If plan is discharge home, recommend the following: Two people to help with walking and/or transfers;Two people to help with bathing/dressing/bathroom;Direct supervision/assist for medications management;Assist for transportation;Assistance with cooking/housework;Help with stairs or ramp for entrance    Functional Status Assessment  Patient has had a recent decline in their functional status and demonstrates the ability to make significant improvements in function in a reasonable and predictable amount of time.  Equipment Recommendations  Other (comment) (defer to next level of care)    Recommendations for Other Services       Precautions / Restrictions Precautions Precautions: Fall Restrictions Weight Bearing Restrictions: No LLE Weight Bearing: Weight bearing as tolerated      Mobility Bed Mobility Overal bed mobility: Needs Assistance             General bed mobility comments: pt unable to tolerate  any bed mobility, due to grimacing, guarding, and moaning when even light activity was attempted        Balance       Sitting balance - Comments: unable to assess       Standing balance comment: unable to assess              ADL either performed or assessed with clinical judgement   ADL Overall ADL's : Needs assistance/impaired Eating/Feeding: Moderate assistance;Bed level Eating/Feeding Details (indicate cue type and reason): required mod hand-over-hand assist to drink water from cup in bed Grooming: Moderate assistance;Bed level;Maximal assistance Grooming Details (indicate cue type and reason): simulated         Upper Body Dressing : Maximal assistance;Bed level   Lower Body Dressing: Total assistance;Bed level       Toileting- Clothing Manipulation and Hygiene: Total assistance;Bed level               Vision   Additional Comments: she correctly read the time on the wall clock            Pertinent Vitals/Pain Pain Assessment Pain Assessment: Faces Pain Score: 8  Pain Location: generalized to touch Pain Descriptors / Indicators: Grimacing, Guarding Pain Intervention(s): Limited activity within patient's tolerance, Monitored during session, Repositioned     Extremity/Trunk Assessment Upper Extremity Assessment Upper Extremity Assessment: Generalized weakness (required AAROM for BUE elbows and shoulders; demonstrated gross grasp bilaterally only )   Lower Extremity Assessment Lower Extremity Assessment: Generalized weakness;LLE deficits/detail;RLE deficits/detail RLE Deficits / Details: pt with minimal tolerance for formal testing; guarding, grimacing, and moaning noted, even to light touch LLE Deficits / Details: pt with minimal tolerance  for formal testing; guarding, grimacing, and moaning noted, even to light touch       Communication Communication Communication: No apparent difficulties Cueing Techniques: Verbal cues   Cognition Arousal:   (drowsy)   Overall Cognitive Status: Difficult to assess Area of Impairment: Attention      General Comments: Oriented to person, place, month, and sitaution. Reported year to be "2025". Required occasional repetition of prompts and cues to open eyes               Home Living Family/patient expects to be discharged to:: Private residence Living Arrangements: Alone   Type of Home: House Home Access: Stairs to enter Secretary/administrator of Steps: 4   Home Layout: One level     Bathroom Shower/Tub: Runner, broadcasting/film/video: Shower seat - built in;Rollator (4 wheels)          Prior Functioning/Environment Prior Level of Function : Needs assist             Mobility Comments:  (Used rollator for household ambulation.) ADLs Comments:  (Reported having daily caregiver for 8 hrs each day who assisted with cleaning, cooking, bathing, and dressing.)        OT Problem List: Decreased strength;Decreased activity tolerance;Decreased range of motion;Impaired balance (sitting and/or standing);Decreased cognition;Decreased knowledge of use of DME or AE;Pain      OT Treatment/Interventions: Self-care/ADL training;Therapeutic exercise;Energy conservation;DME and/or AE instruction;Therapeutic activities;Cognitive remediation/compensation;Balance training;Patient/family education    OT Goals(Current goals can be found in the care plan section) Acute Rehab OT Goals Patient Stated Goal: she did not specifically state OT Goal Formulation: With patient Time For Goal Achievement: 12/27/22 Potential to Achieve Goals:  (guarded at current) ADL Goals Pt Will Perform Eating: with set-up;sitting Pt Will Perform Grooming: with set-up;sitting Pt Will Perform Upper Body Dressing: with supervision;with set-up;sitting Pt Will Transfer to Toilet: with min assist;bedside commode;stand pivot transfer  OT Frequency: Min 1X/week       AM-PAC OT "6 Clicks" Daily Activity      Outcome Measure Help from another person eating meals?: A Lot Help from another person taking care of personal grooming?: A Lot Help from another person toileting, which includes using toliet, bedpan, or urinal?: Total Help from another person bathing (including washing, rinsing, drying)?: A Lot Help from another person to put on and taking off regular upper body clothing?: A Lot Help from another person to put on and taking off regular lower body clothing?: Total 6 Click Score: 10   End of Session Equipment Utilized During Treatment: Oxygen Nurse Communication: Patient requests pain meds  Activity Tolerance: Patient limited by pain;Patient limited by lethargy Patient left: in bed;with call bell/phone within reach;with bed alarm set;with nursing/sitter in room  OT Visit Diagnosis: Muscle weakness (generalized) (M62.81);History of falling (Z91.81);Pain Pain - part of body:  (generalized)                Time: 6962-9528 OT Time Calculation (min): 13 min Charges:  OT General Charges $OT Visit: 1 Visit OT Evaluation $OT Eval Moderate Complexity: 1 Mod    L , OTR/L 12/13/2022, 6:03 PM

## 2022-12-13 NOTE — Evaluation (Signed)
Physical Therapy Evaluation Patient Details Name: [name] MRN: [id number] DOB: 02-07-1930 Today's Date: 12/13/2022  History of Present Illness  89 yo female admitted with L hip fx. S/P L THA 12/12/22. Hx of R THA, breast cancer, anemia, esential tremor, osteoporosis, gait abnormality, anxiety, asthma, chronic pain, Sz, DM  Clinical Impression  Bed level eval only. Assessment significantly limited by pain. Pt crying out with attempts to range bil ankles. With time and encouragement, she did allow me to minimally range R LE. She was unable to tolerate any movement of L LE. She was able to activity move bil UEs. Pt reported 10/10 pain "all over". She was premedicated prior to evaluation. Will plan to follow and progress activity as pt is able to tolerate. Patient will benefit from continued inpatient follow up therapy, <3 hours/day         If plan is discharge home, recommend the following: Two people to help with walking and/or transfers;Two people to help with bathing/dressing/bathroom   Can travel by private vehicle   No    Equipment Recommendations None recommended by PT  Recommendations for Other Services  OT consult    Functional Status Assessment Patient has had a recent decline in their functional status and demonstrates the ability to make significant improvements in function in a reasonable and predictable amount of time.     Precautions / Restrictions Precautions Precautions: Fall Restrictions Weight Bearing Restrictions: No LLE Weight Bearing: Weight bearing as tolerated      Mobility  Bed Mobility               General bed mobility comments: Nt-pt unable 2* pain. Premedicated about 1 hour prior to session. Pt barely able to tolerate DF/PF of both ankles. She did allow me to slightly range R hip/knee.    Transfers                        Ambulation/Gait                  Stairs            Wheelchair Mobility     Tilt Bed     Modified Rankin (Stroke Patients Only)       Balance Overall balance assessment: Needs assistance                                           Pertinent Vitals/Pain Pain Assessment Pain Assessment: 0-10 Pain Score: 10-Worst pain ever Pain Location: "all over" Pain Descriptors / Indicators: Grimacing, Guarding Pain Intervention(s): Limited activity within patient's tolerance, Monitored during session    Home Living Family/patient expects to be discharged to:: Private residence Living Arrangements: Alone Available Help at Discharge: Family;Personal care attendant;Available PRN/intermittently Type of Home: House Home Access: Stairs to enter Entrance Stairs-Rails: Right Entrance Stairs-Number of Steps: 4   Home Layout: One level Home Equipment: Agricultural consultant (2 wheels);Rollator (4 wheels);Shower seat;BSC/3in1 Additional Comments: sister helps F-Sun, caregiver w-Th, then niece helps M-T, but none overnight    Prior Function Prior Level of Function : Independent/Modified Independent             Mobility Comments: rollator with no assist ADLs Comments: assist for bathing, dressing, meals     Extremity/Trunk Assessment   Upper Extremity Assessment Upper Extremity Assessment: Defer to OT evaluation    Lower Extremity Assessment  Lower Extremity Assessment:  (unable to assess 2* pain-pt crying out even with attempts to help her DF/PF ankles)       Communication   Communication Communication: No apparent difficulties  Cognition Arousal: Alert (but drowsy) Behavior During Therapy: WFL for tasks assessed/performed Overall Cognitive Status: Within Functional Limits for tasks assessed                                          General Comments      Exercises     Assessment/Plan    PT Assessment Patient needs continued PT services  PT Problem List Decreased strength;Decreased range of motion;Decreased activity tolerance;Decreased  balance;Decreased mobility;Decreased knowledge of use of DME;Pain       PT Treatment Interventions DME instruction;Gait training;Balance training;Functional mobility training;Therapeutic activities;Therapeutic exercise;Patient/family education;Stair training    PT Goals (Current goals can be found in the Care Plan section)  Acute Rehab PT Goals Patient Stated Goal: less pain PT Goal Formulation: With patient Time For Goal Achievement: 12/28/22 Potential to Achieve Goals: Fair    Frequency Min 1X/week     Co-evaluation               AM-PAC PT "6 Clicks" Mobility  Outcome Measure Help needed turning from your back to your side while in a flat bed without using bedrails?: Total Help needed moving from lying on your back to sitting on the side of a flat bed without using bedrails?: Total Help needed moving to and from a bed to a chair (including a wheelchair)?: Total Help needed standing up from a chair using your arms (e.g., wheelchair or bedside chair)?: Total Help needed to walk in hospital room?: Total Help needed climbing 3-5 steps with a railing? : Total 6 Click Score: 6    End of Session   Activity Tolerance: Patient limited by pain Patient left: in bed;with call bell/phone within reach;with bed alarm set   PT Visit Diagnosis: History of falling (Z91.81);Other abnormalities of gait and mobility (R26.89);Pain;Difficulty in walking, not elsewhere classified (R26.2);Muscle weakness (generalized) (M62.81)    Time: 1014-1040 PT Time Calculation (min) (ACUTE ONLY): 26 min   Charges:   PT Evaluation $PT Eval Low Complexity: 1 Low   PT General Charges $$ ACUTE PT VISIT: 1 Visit            Faye Ramsay, PT Acute Rehabilitation  Office: [phone]

## 2022-12-14 ENCOUNTER — Encounter (HOSPITAL_COMMUNITY): Payer: Self-pay | Admitting: Orthopedic Surgery

## 2022-12-14 DIAGNOSIS — E871 Hypo-osmolality and hyponatremia: Secondary | ICD-10-CM | POA: Diagnosis not present

## 2022-12-14 DIAGNOSIS — I10 Essential (primary) hypertension: Secondary | ICD-10-CM | POA: Diagnosis not present

## 2022-12-14 DIAGNOSIS — S72002A Fracture of unspecified part of neck of left femur, initial encounter for closed fracture: Secondary | ICD-10-CM | POA: Diagnosis not present

## 2022-12-14 LAB — GLUCOSE, CAPILLARY
Glucose-Capillary: 174 mg/dL — ABNORMAL HIGH (ref 70–99)
Glucose-Capillary: 186 mg/dL — ABNORMAL HIGH (ref 70–99)
Glucose-Capillary: 154 mg/dL — ABNORMAL HIGH (ref 70–99)
Glucose-Capillary: 168 mg/dL — ABNORMAL HIGH (ref 70–99)

## 2022-12-14 LAB — PREPARE RBC (CROSSMATCH)

## 2022-12-14 LAB — TYPE AND SCREEN
ABO/RH(D): A POS
Antibody Screen: NEGATIVE
Unit division: 0

## 2022-12-14 LAB — BPAM RBC
Blood Product Expiration Date: 202408302359
ISSUE DATE / TIME: 202408121130
Unit Type and Rh: 6200

## 2022-12-14 LAB — HEMOGLOBIN AND HEMATOCRIT, BLOOD
HCT: 26.3 % — ABNORMAL LOW (ref 36.0–46.0)
Hemoglobin: 8.6 g/dL — ABNORMAL LOW (ref 12.0–15.0)

## 2022-12-14 MED ORDER — APIXABAN 2.5 MG PO TABS: 2.5000 mg | ORAL_TABLET | Freq: Two times a day (BID) | ORAL | 0 refills | Status: AC

## 2022-12-14 MED ORDER — OXYCODONE HCL 5 MG PO TABS: 5.0000 mg | ORAL_TABLET | ORAL | 0 refills | Status: AC | PRN

## 2022-12-14 MED ORDER — SODIUM CHLORIDE 0.9% IV SOLUTION: Freq: Once | INTRAVENOUS | Status: AC

## 2022-12-14 NOTE — TOC Progression Note (Signed)
Transition of Care [facility]) - Progression Note    Patient Details  Name: [name] MRN: [id number] Date of Birth: Dec 31, 1929  [facility] CM/SW Contact  Larrie Kass, LCSW Phone Number: 12/14/2022, 3:42 PM  Clinical Narrative:     CSW attempted to speak with pt about short term rehab recommendation. Pt was unable to arouse. CSW attempted to speak with pt's Niece [name] no answer left HIPAA complaint VM requesting return call. CSW spoke with pt's [name] he reports that pt's niece is the one to make this decision. TOC to follow.         Expected Discharge Plan and Services                                               Social Determinants of Health (SDOH) Interventions SDOH Screenings   Food Insecurity: No Food Insecurity (12/09/2022)  Housing: Low Risk  (12/09/2022)  Transportation Needs: No Transportation Needs (12/09/2022)  Utilities: Not At Risk (12/09/2022)  Depression (PHQ2-9): Low Risk  (09/18/2020)  Social Connections: Unknown (09/16/2021)   [facility]  Tobacco Use: Low Risk  (12/10/2022)    Readmission Risk Interventions    10/27/2022   12:30 PM 01/29/2022   10:17 AM 06/06/2020    2:30 PM  Readmission Risk Prevention Plan  Transportation Screening Complete Complete Complete  PCP or Specialist Appt within 3-5 Days Complete Complete   HRI or Home Care Consult Complete Complete   Social Work Consult for Recovery Care Planning/Counseling Complete Complete   Palliative Care Screening  Complete   Medication Review Oceanographer) Complete Complete   PCP or Specialist appointment within 3-5 days of discharge   Complete  SW Recovery Care/Counseling Consult   Complete  Palliative Care Screening   Not Applicable

## 2022-12-14 NOTE — Plan of Care (Signed)
  Problem: Pain Management: Goal: Pain level will decrease with appropriate interventions Outcome: Progressing   Problem: Skin Integrity: Goal: Will show signs of wound healing Outcome: Progressing   Problem: Clinical Measurements: Goal: Postoperative complications will be avoided or minimized Outcome: Progressing   Problem: Safety: Goal: Ability to remain free from injury will improve Outcome: Progressing

## 2022-12-14 NOTE — Progress Notes (Signed)
Patient refuses to sign blood consent at this time. She states "I have never had to get blood before. I am not signing the consent until I get better food. I just need to eat more." Patient informed of risks and benefits of having the blood transfusion, but she still declines to sign the consent form at this time. She also declines to sign the blood transfusion refusal form.

## 2022-12-14 NOTE — Progress Notes (Signed)
    Subjective:  Patient reports pain as moderate.  Denies N/V/CP/SOB/Abd pain. She reports pain in her LLE today. Denies tingling or numbness in LE bilaterally.   Objective:   VITALS:   Vitals:   12/13/22 2042 12/14/22 0536 12/14/22 1122 12/14/22 1210  BP: (!) 146/70 (!) 123/59 (!) 116/52 (!) 112/48  Pulse: 91 87 92 93  Resp: 16 16 20 18   Temp: 98.9 F (37.2 C) 97.8 F (36.6 C) 98.7 F (37.1 C) 97.7 F (36.5 C)  TempSrc: Oral Oral Oral Axillary  SpO2: 100% 98% 100% 98%  Weight:      Height:        NAD ABD soft Neurovascular intact Sensation intact distally Intact pulses distally Dorsiflexion/Plantar flexion intact Incision: dressing C/D/I No cellulitis present Compartment soft Currently receiving blood transfusion.   Lab Results  Component Value Date   WBC 8.4 12/14/2022   HGB 6.7 (LL) 12/14/2022   HCT 20.7 (L) 12/14/2022   MCV 92.8 12/14/2022   PLT 284 12/14/2022   BMET    Component Value Date/Time   NA 127 (L) 12/13/2022 0818   NA 130 (L) 12/24/2020 1143   NA 138 01/04/2013 1212   K 3.9 12/13/2022 0818   K 3.9 01/04/2013 1212   CL 100 12/13/2022 0818   CO2 19 (L) 12/13/2022 0818   CO2 22 01/04/2013 1212   GLUCOSE 181 (H) 12/13/2022 0818   GLUCOSE 285 (H) 01/04/2013 1212   BUN 9 12/13/2022 0818   BUN 14 12/24/2020 1143   BUN 10.3 01/04/2013 1212   CREATININE 0.64 12/13/2022 0818   CREATININE 0.78 12/01/2013 1659   CREATININE 1.0 01/04/2013 1212   CALCIUM 7.7 (L) 12/13/2022 0818   CALCIUM 9.6 01/04/2013 1212   EGFR 69 12/24/2020 1143   GFRNONAA >60 12/13/2022 0818     Assessment/Plan: 2 Days Post-Op   Principal Problem:   Closed left hip fracture, initial encounter (HCC) Active Problems:   Mixed hyperlipidemia   ANEMIA-NOS   Essential hypertension   GERD   Seizure disorder (HCC)   Acquired hypothyroidism   Hyponatremia   Asthma   Uncontrolled type 2 diabetes mellitus with hyperglycemia, with long-term current use of insulin (HCC)    Constipation   WBAT with walker DVT ppx:  Eliquis , SCDs, TEDS PO pain control PT/OT Dispo: Patient under care of the medical team disposition per their recommendation. Eliquis for DVT ppx. Pain medication printed in chart.    Clois Dupes, PA-C 12/14/2022, 1:16 PM   [facility]  [facility] [address], [place], [place] [address] Phone: [phone] [email or web address] Facebook  [employer]

## 2022-12-14 NOTE — Progress Notes (Signed)
[facility] PROGRESS NOTE    Progress Note  [name]  GNF:[id number] DOB: 1929-07-09 DOA: 12/09/2022 PCP: Verlon Au, MD     Brief Narrative:   [name] is an 89 y.o. female past medical history significant for unspecified anemia, asthma/chronic bronchitis, right breast cancer history of radiation, carotid artery stenosis, diabetes mellitus type 2, brought into the ED via EMS after a fall, hemoglobin was 9 white blood cell count 8.3 chest x-ray showed no acute findings she was complaining of left hip pain and imaging showed minimally displaced subcapital femoral neck fracture with repeat surgery was consulted   Assessment/Plan:   Closed left hip fracture, initial encounter (HCC) Continue analgesics and antiemetics. Orthopedic surgery was consulted who recommended THR, performed on 12/11/2022. Cont on a bowel regiment. PT has evaluated the patient. Anticipate will need skilled nursing facility. She is oversedated, discontinue all IV narcotics continue orals.  Hyponatremia: Secondary to chronic diarrhea, on sodium tablets and cholestyramine. Improving with IV fluids. She usually ranges from 126-1 31.  Uncontrolled type 2 diabetes mellitus with hyperglycemia, with long-term current use  of insulin (HCC) Continue CBGs ACHS.  Continue sliding scale. Blood glucose fairly controlled.  Mixed hyperlipidemia Continue cholestyramine.  ANEMIA-NOS/acute blood loss anemia: Transfused 2 units of packed red blood cells recheck CBC posttransfusion.  Essential hypertension Continue verapamil.  Essential tremors: Continue primidone.  GERD: Continue Protonix.  Seizure disorder (HCC) Continue phenytoin  Acquired hypothyroidism Continue Synthroid   DVT prophylaxis: Lovenox Family Communication:none Status is: Inpatient Remains inpatient appropriate because: Acute closed hip fracture    Code Status:     Code Status Orders  (From admission, onward)            Start     Ordered   12/09/22 1549  Full code  Continuous       Question:  By:  Answer:  Consent: discussion documented in EHR   12/09/22 1552           Code Status History     Date Active Date Inactive Code Status Order ID Comments User Context   12/09/2022 1552 12/09/2022 1552 Full Code [id number]  Bobette Mo, MD ED   10/24/2022 1415 11/01/2022 2012 Full Code [id number]  Leatha Gilding, MD ED   01/24/2022 0245 01/29/2022 1730 Full Code [id number]  Briscoe Deutscher, MD ED   12/21/2021 1446 12/22/2021 2154 Full Code [id number]  Belva Agee, MD ED   04/03/2021 1415 04/08/2021 1737 Full Code [id number]  Darlin Drop, DO ED   08/17/2020 0338 08/22/2020 2001 Full Code [id number]  Eduard Clos, MD ED   06/04/2020 1322 06/11/2020 0538 Full Code [id number]  Vassie Loll, MD ED   04/01/2020 0654 04/02/2020 1735 Full Code [id number]  Marinda Elk, MD ED   03/12/2020 2239 03/21/2020 1909 Full Code [id number]  Gaynelle Adu, MD ED   03/06/2020 1451 03/12/2020 1720 Full Code [id number]  Andria Meuse, MD Inpatient   10/26/2018 0153 10/29/2018 1938 Full Code [id number]  Hugelmeyer, Killbuck, DO Inpatient   07/19/2014 1107 07/20/2014 0335 Full Code [id number]  Oley Balm, MD HOV   10/18/2013 1942 10/27/2013 2023 Full Code [id number]  Karn Cassis, MD Inpatient   10/09/2013 2217 10/18/2013 1942 Full Code [id number]  Rama, Maryruth Bun, MD Inpatient         IV Access:   Peripheral IV   Procedures and diagnostic studies:   DG HIP UNILAT WITH PELVIS 1V LEFT  Result Date: 12/12/2022 CLINICAL DATA:  Left hip arthroplasty EXAM: DG HIP (WITH OR WITHOUT PELVIS) 1V*L* COMPARISON:  Pelvis radiographs 12/09/2022 FINDINGS: Two C-arm fluoroscopic images were obtained intraoperatively and submitted for post operative interpretation. Postsurgical changes reflecting left hip arthroplasty with cerclage wire fixation are noted. There is no evidence of immediate complication. Fluoro time 28  seconds, dose 1.89 mGy. Please see the performing provider's procedural report for further detail. IMPRESSION: Status post left hip arthroplasty without evidence of immediate complication. Electronically Signed   By: Lesia Hausen M.D.   On: 12/12/2022 15:17   DG Pelvis Portable  Result Date: 12/12/2022 CLINICAL DATA:  Status post hip surgery EXAM: PORTABLE PELVIS 1-2 VIEWS COMPARISON:  Pelvis radiographs 06/11/2022 FINDINGS: There has been interval left hip arthroplasty with cerclage wire fixation. Hardware alignment is within expected limits, without evidence of complication. There is expected surrounding postoperative soft tissue gas. Right hip arthroplasty hardware is stable, without evidence of complication. The SI joints and symphysis pubis are intact. IMPRESSION: Status post left hip arthroplasty without evidence of complication. Electronically Signed   By: Lesia Hausen M.D.   On: 12/12/2022 15:15   DG C-Arm 1-60 Min-No Report  Result Date: 12/12/2022 Fluoroscopy was utilized by the requesting physician.  No radiographic interpretation.   DG C-Arm 1-60 Min-No Report  Result Date: 12/12/2022 Fluoroscopy was utilized by the requesting physician.  No radiographic interpretation.     Medical Consultants:   None.   Subjective:    [name] I could barely wake her up she was sleepy this morning  Objective:    Vitals:   12/13/22 0408 12/13/22 1201 12/13/22 2042 12/14/22 0536  BP: 138/68 (!) 118/56 (!) 146/70 (!) 123/59  Pulse: 99 92 91 87  Resp: 18 18 16 16   Temp: 98 F (36.7 C) 98 F (36.7 C) 98.9 F (37.2 C) 97.8 F (36.6 C)  TempSrc: Oral Oral Oral Oral  SpO2: 93% 99% 100% 98%  Weight:      Height:       SpO2: 98 % O2 Flow Rate (L/min): 2 L/min   Intake/Output Summary (Last 24 hours) at 12/14/2022 0958 Last data filed at 12/14/2022 0617 Gross per 24 hour  Intake 2124.64 ml  Output 275 ml  Net 1849.64 ml   Filed Weights   12/09/22 1306  Weight: 65 kg     Exam: General exam: In no acute distress. Respiratory system: Good air movement and clear to auscultation. Cardiovascular system: S1 & S2 heard, RRR. No JVD. Gastrointestinal system: Abdomen is nondistended, soft and nontender.  Extremities: No pedal edema. Skin: No rashes, lesions or ulcers Data Reviewed:    Labs: Basic Metabolic Panel: Recent Labs  Lab 12/09/22 1319 12/10/22 0413 12/13/22 0818  NA 125* 127* 127*  K 4.4 3.7 3.9  CL 94* 98 100  CO2 21* 19* 19*  GLUCOSE 180* 146* 181*  BUN 13 9 9   CREATININE 0.60 0.53 0.64  CALCIUM 8.5* 7.6* 7.7*   GFR Estimated Creatinine Clearance: 45.5 mL/min (by C-G formula based on SCr of 0.64 mg/dL). Liver Function Tests: Recent Labs  Lab 12/10/22 0413  AST 23  ALT 14  ALKPHOS 75  BILITOT 0.3  PROT 5.2*  ALBUMIN 2.6*   No results for input(s): "LIPASE", "AMYLASE" in the last 168 hours. No results for input(s): "AMMONIA" in the last 168 hours. Coagulation profile Recent Labs  Lab 12/09/22 1319  INR 1.0   COVID-19 Labs  No results for input(s): "DDIMER", "FERRITIN", "LDH", "CRP" in the last 72 hours.  Lab Results  Component Value Date   SARSCOV2NAA NEGATIVE 04/07/2021   SARSCOV2NAA NEGATIVE 04/03/2021   SARSCOV2NAA NEGATIVE 03/30/2021   SARSCOV2NAA NEGATIVE 08/17/2020    CBC: Recent Labs  Lab 12/09/22 1319 12/10/22 0413 12/13/22 0818 12/14/22 0405  WBC 8.3 10.1 11.2* 8.4  NEUTROABS 6.6  --   --   --   HGB 9.7* 9.9* 7.1* 6.7*  HCT 30.7* 30.8* 21.9* 20.7*  MCV 93.3 92.2 93.6 92.8  PLT 293 315 252 284   Cardiac Enzymes: No results for input(s): "CKTOTAL", "CKMB", "CKMBINDEX", "TROPONINI" in the last 168 hours. BNP (last 3 results) No results for input(s): "PROBNP" in the last 8760 hours. CBG: Recent Labs  Lab 12/13/22 0749 12/13/22 1158 12/13/22 1721 12/13/22 2044 12/14/22 0759  GLUCAP 166* 132* 254* 176* 174*   D-Dimer: No results for input(s): "DDIMER" in the last 72 hours. Hgb A1c: No  results for input(s): "HGBA1C" in the last 72 hours. Lipid Profile: No results for input(s): "CHOL", "HDL", "LDLCALC", "TRIG", "CHOLHDL", "LDLDIRECT" in the last 72 hours. Thyroid function studies: No results for input(s): "TSH", "T4TOTAL", "T3FREE", "THYROIDAB" in the last 72 hours.  Invalid input(s): "FREET3" Anemia work up: No results for input(s): "VITAMINB12", "FOLATE", "FERRITIN", "TIBC", "IRON", "RETICCTPCT" in the last 72 hours. Sepsis Labs: Recent Labs  Lab 12/09/22 1319 12/10/22 0413 12/13/22 0818 12/14/22 0405  WBC 8.3 10.1 11.2* 8.4   Microbiology Recent Results (from the past 240 hour(s))  Surgical PCR screen     Status: None   Collection Time: 12/11/22  9:31 PM   Specimen: Nasal Mucosa; Nasal Swab  Result Value Ref Range Status   MRSA, PCR NEGATIVE NEGATIVE Final   Staphylococcus aureus NEGATIVE NEGATIVE Final    Comment: (NOTE) The Xpert SA Assay (FDA approved for NASAL specimens in patients 29 years of age and older), is one component of a comprehensive surveillance program. It is not intended to diagnose infection nor to guide or monitor treatment. Performed at [facility], [address], [place], [place] [address]      Medications:    sodium chloride   Intravenous Once   apixaban  2.5 mg Oral Q12H   cholecalciferol  5,000 Units Oral Daily   cholestyramine light  4 g Oral BID   cyanocobalamin  1,000 mcg Intramuscular Q21 days   docusate sodium  100 mg Oral BID   insulin aspart  0-5 Units Subcutaneous QHS   insulin aspart  0-9 Units Subcutaneous TID WC   insulin aspart  3 Units Subcutaneous TID WC   insulin glargine-yfgn  10 Units Subcutaneous QHS   levothyroxine  75 mcg Oral QAC breakfast   mupirocin ointment  1 Application Nasal BID   pantoprazole  40 mg Oral Daily   phenytoin  50 mg Oral Daily   phenytoin  300 mg Oral QHS   polyethylene glycol  17 g Oral BID   primidone  150 mg Oral QHS   primidone  250 mg Oral Daily    senna  1 tablet Oral BID   sodium chloride  2 g Oral BID WC   verapamil  180 mg Oral Daily   Continuous Infusions:  sodium chloride 150 mL/hr at 12/14/22 0617   methocarbamol (ROBAXIN) IV         LOS: 5 days   Marinda Elk  [facility]  12/14/2022, 9:58 AM

## 2022-12-15 DIAGNOSIS — E871 Hypo-osmolality and hyponatremia: Secondary | ICD-10-CM | POA: Diagnosis not present

## 2022-12-15 DIAGNOSIS — S72002A Fracture of unspecified part of neck of left femur, initial encounter for closed fracture: Secondary | ICD-10-CM | POA: Diagnosis not present

## 2022-12-15 LAB — GLUCOSE, CAPILLARY
Glucose-Capillary: 165 mg/dL — ABNORMAL HIGH (ref 70–99)
Glucose-Capillary: 184 mg/dL — ABNORMAL HIGH (ref 70–99)
Glucose-Capillary: 174 mg/dL — ABNORMAL HIGH (ref 70–99)
Glucose-Capillary: 131 mg/dL — ABNORMAL HIGH (ref 70–99)

## 2022-12-15 MED ORDER — POLYETHYLENE GLYCOL 3350 17 G PO PACK
17.0000 g | PACK | Freq: Every day | ORAL | Status: DC
  Filled 2022-12-15: qty 1

## 2022-12-15 MED ORDER — POLYETHYLENE GLYCOL 3350 17 G PO PACK: 17.0000 g | PACK | Freq: Every day | ORAL | 0 refills | Status: AC | PRN

## 2022-12-15 MED ORDER — SALINE SPRAY 0.65 % NA SOLN
1.0000 | NASAL | Status: DC | PRN
  Filled 2022-12-15: qty 44

## 2022-12-15 NOTE — Care Management Important Message (Signed)
Important Message  Patient Details IM Letter placed in room. Name: [name] MRN: [id number] Date of Birth: 07-05-1929   Medicare Important Message Given:  Yes     Caren Macadam 12/15/2022, 12:16 PM

## 2022-12-15 NOTE — Progress Notes (Signed)
At 1400: RN bladder scanned patient per order and patient had not had very much urinary output. When RN bladder scanned patient, it showed that patient had 529 mL of urine in bladder. Notified MD. RN and Charge Nurse tried to get patient up to the bedside commode but patient refused. Patient was also still lethargic. Patient was placed on a bedpan but unsuccessful with very little urine. RN and Press photographer noticed that patient's bed/sheets were wet when putting patient on bedpan. Despite the unsuccessful attempt to get patient up to use the bedside commode and other measures, RN had to in and out cath patient. It took the RN, Press photographer, and another staff RN to do the in and out cath on the patient. 550 mL of urinary output was drained from patient's bladder from doing the in and out cath.   At 1952: RN bladder scanned patient again per order and patient had 252 mL of urine in the bladder. Will continue to monitor and encourage patient to urinate.

## 2022-12-15 NOTE — Progress Notes (Signed)
Ptar leaving with pt to transport to [facility] from [facility]. Pt POA, [name], contacted and informed of transport.

## 2022-12-15 NOTE — TOC Transition Note (Signed)
Transition of Care [facility]) - CM/SW Discharge Note   Patient Details  Name: [name] MRN: [id number] Date of Birth: May 19, 1929  [facility]) CM/SW Contact:  Howell Rucks, RN Phone Number: 12/15/2022, 2:32 PM   Clinical Narrative:       Final next level of care: Skilled Nursing Facility Barriers to Discharge: No Barriers Identified   Patient Goals and CMS Choice CMS Medicare.gov Compare Post Acute Care list provided to:: Patient Represenative (must comment) [name] (Niece)  [phone]  [facility] Phone)) Choice offered to / list presented to : [facility] POA / Guardian [name] (Niece)  [phone] (Home Phone))  Discharge Placement                Patient chooses bed at: [facility] Patient to be transferred to facility by: PTAR Name of family member notified: [name] (niece) Patient and family notified of of transfer: 12/15/22  Discharge Plan and Services Additional resources added to the After Visit Summary for     Discharge Planning Services: CM Consult                                 Social Determinants of Health (SDOH) Interventions SDOH Screenings   Food Insecurity: No Food Insecurity (12/09/2022)  Housing: Low Risk  (12/09/2022)  Transportation Needs: No Transportation Needs (12/09/2022)  Utilities: Not At Risk (12/09/2022)  Depression (PHQ2-9): Low Risk  (09/18/2020)  Social Connections: Unknown (09/16/2021)   [facility]  Tobacco Use: Low Risk  (12/10/2022)     Readmission Risk Interventions    12/15/2022   11:12 AM 10/27/2022   12:30 PM 01/29/2022   10:17 AM  Readmission Risk Prevention Plan  Transportation Screening Complete Complete Complete  PCP or Specialist Appt within 3-5 Days  Complete Complete  HRI or Home Care Consult  Complete Complete  Social Work Consult for Recovery Care Planning/Counseling  Complete Complete  Palliative Care Screening   Complete  Medication Review Oceanographer)  Complete Complete Complete  PCP or Specialist appointment within 3-5 days of discharge Complete    HRI or Home Care Consult Complete    SW Recovery Care/Counseling Consult Complete    Palliative Care Screening Not Applicable    Skilled Nursing Facility Complete

## 2022-12-15 NOTE — TOC Transition Note (Signed)
Transition of Care [facility]) - CM/SW Discharge Note   Patient Details  Name: [name] MRN: [id number] Date of Birth: October 10, 1929  Transition of Care [facility]) CM/SW Contact:  Howell Rucks, RN Phone Number: 12/15/2022, 2:31 PM   Clinical Narrative:  Call from [place] with [name], extended bed offer, reports she spoke with pt's niece [name]), she will bring pt's clothes to facility and has a family meeting at facility on Friday, 8/16. PTAR for transportation. RM 701, nurse report [phone]. No further TOC needs identified.         Barriers to Discharge: No Barriers Identified   Patient Goals and CMS Choice CMS Medicare.gov Compare Post Acute Care list provided to:: Patient Represenative (must comment) [name] (Niece)  [phone] [facility] Phone)) Choice offered to / list presented to : [facility] POA / Guardian [name] (Niece)  [phone] (Home Phone))  Discharge Placement                         Discharge Plan and Services Additional resources added to the After Visit Summary for     Discharge Planning Services: CM Consult                                 Social Determinants of Health (SDOH) Interventions SDOH Screenings   Food Insecurity: No Food Insecurity (12/09/2022)  Housing: Low Risk  (12/09/2022)  Transportation Needs: No Transportation Needs (12/09/2022)  Utilities: Not At Risk (12/09/2022)  Depression (PHQ2-9): Low Risk  (09/18/2020)  Social Connections: Unknown (09/16/2021)   Received from [facility], [facility]  Tobacco Use: Low Risk  (12/10/2022)     Readmission Risk Interventions    12/15/2022   11:12 AM 10/27/2022   12:30 PM 01/29/2022   10:17 AM  Readmission Risk Prevention Plan  Transportation Screening Complete Complete Complete  PCP or Specialist Appt within 3-5 Days  Complete Complete  HRI or Home Care Consult  Complete Complete  Social Work Consult for Recovery Care Planning/Counseling  Complete Complete  Palliative Care  Screening   Complete  Medication Review Oceanographer) Complete Complete Complete  PCP or Specialist appointment within 3-5 days of discharge Complete    HRI or Home Care Consult Complete    SW Recovery Care/Counseling Consult Complete    Palliative Care Screening Not Applicable    Skilled Nursing Facility Complete

## 2022-12-15 NOTE — Progress Notes (Signed)
Physical Therapy Treatment Patient Details Name: [name] MRN: [id number] DOB: [birth year] Today's Date: 12/15/2022   History of Present Illness 89 yo female admitted with L hip fx. S/P L THA 12/12/22. Hx of R THA, breast cancer, anemia, esential tremor, osteoporosis, gait abnormality, anxiety, asthma, chronic pain, Sz, DM    PT Comments  Pt not able to receive narcotics prior to PT session per RN 2* lethargy.  Pt stated she needed a bed pan, so was assisted with +2 total assist to roll to right side. Pt screamed in pain during roll and while on R side. Pt was actively having a BM so assisted her back to supine. NT/RN in room to assist with clean up. Attempted gentle ROM to L hip, pt was unable to tolerate any movement 2* pain.    If plan is discharge home, recommend the following: Two people to help with walking and/or transfers;Two people to help with bathing/dressing/bathroom;Help with stairs or ramp for entrance;Assist for transportation;Direct supervision/assist for financial management;Assistance with cooking/housework   Can travel by private vehicle     No  Equipment Recommendations  None recommended by PT    Recommendations for Other Services OT consult     Precautions / Restrictions Precautions Precautions: Fall Restrictions Weight Bearing Restrictions: No LLE Weight Bearing: Weight bearing as tolerated     Mobility  Bed Mobility Overal bed mobility: Needs Assistance Bed Mobility: Rolling Rolling: +2 for physical assistance, Total assist, +2 for safety/equipment         General bed mobility comments: +2 total assist for rolling to right for pericare, pt screamed entire time with roll and while on her side, she was actively having a BM so was asssited back to supine. NT/RN in room to assist with further clean up.    Transfers                        Ambulation/Gait                   Stairs             Wheelchair Mobility     Tilt  Bed    Modified Rankin (Stroke Patients Only)       Balance                                            Cognition Arousal: Lethargic Behavior During Therapy: WFL for tasks assessed/performed Overall Cognitive Status: No family/caregiver present to determine baseline cognitive functioning                                 General Comments: Pt stated month of her birthdate only, did not respond when asked current location/date. Pt lethargic, eyes closed.        Exercises Total Joint Exercises Ankle Circles/Pumps: AAROM, Both, 10 reps, Supine Heel Slides: AAROM, Left, 5 reps, Supine, Limitations Heel Slides Limitations: yelled in pain with minimal movement    General Comments        Pertinent Vitals/Pain Pain Assessment Pain Score: 10-Worst pain ever Faces Pain Scale: Hurts worst Pain Location: L hip with movement Pain Descriptors / Indicators: Grimacing, Guarding, Crying, Other (Comment) (screaming) Pain Intervention(s): Limited activity within patient's tolerance, Monitored during session, Repositioned (pain meds requested prior to session by  PT, however RN stated pt is lethargic so narcotics will not be given.  RN to give tylenol and robaxin.)    Home Living                          Prior Function            PT Goals (current goals can now be found in the care plan section) Acute Rehab PT Goals Patient Stated Goal: less pain PT Goal Formulation: With patient Time For Goal Achievement: 12/28/22 Potential to Achieve Goals: Fair Progress towards PT goals: Not progressing toward goals - comment (limited by pain)    Frequency    Min 1X/week      PT Plan      Co-evaluation              AM-PAC PT "6 Clicks" Mobility   Outcome Measure  Help needed turning from your back to your side while in a flat bed without using bedrails?: Total Help needed moving from lying on your back to sitting on the side of a flat bed  without using bedrails?: Total Help needed moving to and from a bed to a chair (including a wheelchair)?: Total Help needed standing up from a chair using your arms (e.g., wheelchair or bedside chair)?: Total Help needed to walk in hospital room?: Total Help needed climbing 3-5 steps with a railing? : Total 6 Click Score: 6    End of Session   Activity Tolerance: Patient limited by pain Patient left: in bed;with call bell/phone within reach;with bed alarm set;with nursing/sitter in room   PT Visit Diagnosis: History of falling (Z91.81);Other abnormalities of gait and mobility (R26.89);Pain;Difficulty in walking, not elsewhere classified (R26.2);Muscle weakness (generalized) (M62.81) Pain - Right/Left: Left Pain - part of body: Hip     Time: 7829-5621 PT Time Calculation (min) (ACUTE ONLY): 14 min  Charges:    $Therapeutic Activity: 8-22 mins PT General Charges $$ ACUTE PT VISIT: 1 Visit                     Tamala Ser PT 12/15/2022  Acute Rehabilitation Services  Office [phone]

## 2022-12-15 NOTE — [place] FL2 (Signed)
[place] MEDICAID [id number] LEVEL OF CARE FORM     IDENTIFICATION  Patient Name: [name] Birthdate: Jul 16, 1929 Sex: female Admission Date (Current Location): 12/09/2022  [facility] and [place] Number:  Producer, television/film/video and Address:  [facility],  [address] [place], [place] [address]      Provider Number: [id number]  Attending Physician Name and Address:  David Stall, Darin Engels, MD  Relative Name and Phone Number:  [name] (Niece)  [phone] (Home Phone) HCPOA    Current Level of Care: Hospital Recommended Level of Care: Skilled Nursing Facility Prior Approval Number:    Date Approved/Denied:   PASRR Number: [id number]  Discharge Plan: SNF    Current Diagnoses: Patient Active Problem List   Diagnosis Date Noted   Closed left hip fracture, initial encounter (HCC) 12/09/2022   Generalized weakness 10/28/2022   Left hand pain 10/28/2022   SBO (small bowel obstruction) (HCC) 01/24/2022   Osteopenia 10/06/2021   Family history of malignant neoplasm of digestive organs 10/06/2021   History of malignant neoplasm of colon 10/06/2021   Hx of compression fracture of spine 10/06/2021   Intestinal malabsorption 10/06/2021   Pharyngeal dysphagia 10/06/2021   Carpal tunnel syndrome of left wrist 09/22/2021   CAP (community acquired pneumonia) 04/03/2021   Numbness of hand 01/13/2021   DDD (degenerative disc disease), cervical 12/03/2020   Compression fracture of body of thoracic vertebra (HCC) 08/21/2020   Dilantin toxicity 08/17/2020   Fracture of neck of femur (HCC) 07/11/2020   Postoperative anemia due to acute blood loss 06/06/2020   Leukocytosis    Closed right hip fracture (HCC) 06/04/2020   Constipation 05/27/2020   Dyslipidemia 05/27/2020   Enterocolitis due to Clostridium difficile, not specified as recurrent 05/27/2020   Pleural effusion on right 04/01/2020   Acute respiratory failure with hypoxia (HCC) 04/01/2020   Pneumonia of right lung due  to infectious organism 04/01/2020   Uncontrolled type 2 diabetes mellitus with hyperglycemia, with long-term current use of insulin (HCC) 04/01/2020   Abnormal CT of the chest 04/01/2020   Subacute pulmonary embolism (HCC) 04/01/2020   Acute metabolic encephalopathy 04/01/2020   Adenocarcinoma of colon (HCC) 04/01/2020   Nausea & vomiting 03/12/2020   Cancer of ascending colon pT3pN0 (0/12 LN) s/p lap right colectomy 03/06/2020 03/09/2020   History of multiple allergies 03/09/2020   S/P right hemicolectomy 03/06/2020   Closed fracture of shaft of tibia 02/12/2020   Abnormal feces 12/11/2019   Preop cardiovascular exam 12/11/2019   Gait abnormality 12/04/2019   Impaired left ventricular function 09/05/2019   Pain in joint of left shoulder 02/27/2019   Hypokalemia    Effusion of left knee joint    Hyponatremia    Left patella fracture 10/26/2018   Fracture of patella 10/26/2018   Pain in left knee 09/29/2018   Acquired hypothyroidism 08/25/2017   H/O excision of tumor of brain meninges 08/25/2017   Hammer toe 08/25/2017   History of breast cancer 08/25/2017   Nontoxic thyroid nodule 08/25/2017   Osteopetrosis 08/25/2017   Age-related osteoporosis without current pathological fracture 08/25/2017   Dysuria 01/19/2017   Vitamin D deficiency 11/18/2016   Tremor, essential 08/18/2016   Closed burst fracture of lumbar vertebra (HCC) 11/25/2015   Seizure disorder (HCC) 10/16/2014   Chronic low back pain 12/21/2013   Cerebral meningioma (HCC) 12/21/2013   Brain tumor (benign) (HCC) 10/18/2013   Subdural hemorrhage (HCC) 10/09/2013   Anxiety 10/09/2013   Compression fracture of T12 vertebra (HCC) 10/09/2013  Nontoxic multinodular goiter 09/05/2013   Syncope 08/27/2013   Carotid artery disease (HCC) 08/27/2013   Abnormal thyroid ultrasound 08/27/2013   Cancer of central portion of female breast (HCC) 12/30/2012   Osteoporosis 03/06/2010   ASTHMA 03/05/2009   Asthma 03/05/2009    IRRITABLE BOWEL SYNDROME  diarrhea type 03/21/2008   ANEMIA-NOS 12/16/2006   GERD 12/16/2006   Insulin-requiring or dependent type II diabetes mellitus (HCC) 10/29/2006   Mixed hyperlipidemia 10/29/2006   Essential hypertension 10/29/2006   Allergic rhinitis, cause unspecified 10/29/2006   OSTEOARTHRITIS 10/29/2006    Orientation RESPIRATION BLADDER Height & Weight     Self, Time, Situation, Place  O2 Incontinent, External catheter Weight: 65 kg Height:  5\' 7"  (170.2 cm)  BEHAVIORAL SYMPTOMS/MOOD NEUROLOGICAL BOWEL NUTRITION STATUS    Convulsions/Seizures Incontinent Diet (carb modified)  AMBULATORY STATUS COMMUNICATION OF NEEDS Skin   Extensive Assist Verbally Normal                       Personal Care Assistance Level of Assistance  Bathing, Feeding, Dressing Bathing Assistance: Maximum assistance Feeding assistance: Maximum assistance Dressing Assistance: Maximum assistance     Functional Limitations Info  Sight, Hearing, Speech Sight Info: Impaired Hearing Info: Impaired Speech Info: Adequate    SPECIAL CARE FACTORS FREQUENCY  PT (By licensed PT), OT (By licensed OT)     PT Frequency: 5x/wk OT Frequency: 5x/wk            Contractures Contractures Info: Not present    Additional Factors Info  Code Status, Allergies, Psychotropic Code Status Info: Full Code Allergies Info: Bystolic (Nebivolol Hcl), Carbamazepine, Cholestyramine, Morphine, Niacin, Norvasc (Amlodipine Besylate), Optivar (Azelastine Hcl), Repaglinide, Sular (Nisoldipine Er), Telmisartan, Amoxicillin-pot Clavulanate, Cefdinir, Ciprofloxacin Hcl, Clonidine, Codeine, Ezetimibe, Hydroxychloroquine, Naproxen, Sulfa Antibiotics, Ziac (Bisoprolol-hydrochlorothiazide), Amlodipine Besy-benazepril Hcl, Azelastine, Elavil (Amitriptyline), Empagliflozin, Hydralazine, Iodine I 131 Tositumomab, Kenalog (Triamcinolone), Keppra (Levetiracetam), Lamotrigine, Pregabalin, Pseudoephedrine, Risedronate, Ru-hist D  (Brompheniramine-phenylephrine), Sumatriptan, Topiramate, Ace Inhibitors, Aspirin, Atacand (Candesartan), Bextra (Valdecoxib), Bisoprolol-hydrochlorothiazide, Cefadroxil, Celecoxib, Hydrocodone, Iodinated Contrast Media, Meloxicam, Methylprednisolone Sodium Succinate, Nabumetone, Penicillins, Pseudoephedrine-guaifenesin, Rofecoxib, Ru-tuss (Chlorphen-pse-atrop-hyos-scop), Sulfonamide Derivatives, Sulphur (Elemental Sulfur), Telithromycin, Terfenadine, Trandolapril-verapamil Hcl Er, Valium (Diazepam) Psychotropic Info: N/A         Current Medications (12/15/2022):  This is the current hospital active medication list Current Facility-Administered Medications  Medication Dose Route Frequency Provider Last Rate Last Admin   acetaminophen (TYLENOL) tablet 325-650 mg  325-650 mg Oral Q6H PRN Swinteck, Arlys John, MD       alum & mag hydroxide-simeth (MAALOX/MYLANTA) 200-200-20 MG/5ML suspension 30 mL  30 mL Oral Q4H PRN Swinteck, Arlys John, MD       apixaban Everlene Balls) tablet 2.5 mg  2.5 mg Oral Q12H Samson Frederic, MD   2.5 mg at 12/15/22 2956   cholecalciferol (VITAMIN D3) 25 MCG (1000 UNIT) tablet 5,000 Units  5,000 Units Oral Daily Samson Frederic, MD   5,000 Units at 12/15/22 2130   cholestyramine light (PREVALITE) packet 4 g  4 g Oral BID Samson Frederic, MD   4 g at 12/14/22 1835   cyanocobalamin (VITAMIN B12) injection 1,000 mcg  1,000 mcg Intramuscular Q21 days Samson Frederic, MD   1,000 mcg at 12/11/22 0920   diphenhydrAMINE (BENADRYL) 12.5 MG/5ML elixir 12.5-25 mg  12.5-25 mg Oral Q4H PRN Swinteck, Arlys John, MD       docusate sodium (COLACE) capsule 100 mg  100 mg Oral BID Samson Frederic, MD   100 mg at 12/14/22 2147   insulin aspart (novoLOG) injection 0-5  Units  0-5 Units Subcutaneous QHS Marinda Elk, MD       insulin aspart (novoLOG) injection 0-9 Units  0-9 Units Subcutaneous TID WC Marinda Elk, MD   2 Units at 12/15/22 1055   insulin aspart (novoLOG) injection 3 Units  3 Units  Subcutaneous TID WC Marinda Elk, MD   3 Units at 12/14/22 1836   insulin glargine-yfgn (SEMGLEE) injection 10 Units  10 Units Subcutaneous QHS Samson Frederic, MD   10 Units at 12/14/22 2149   levothyroxine (SYNTHROID) tablet 75 mcg  75 mcg Oral QAC breakfast Samson Frederic, MD   75 mcg at 12/15/22 0920   lip balm (CARMEX) ointment   Topical PRN Samson Frederic, MD   Given at 12/12/22 1957   melatonin tablet 5 mg  5 mg Oral QHS PRN Samson Frederic, MD   5 mg at 12/12/22 2134   menthol-cetylpyridinium (CEPACOL) lozenge 3 mg  1 lozenge Oral PRN Samson Frederic, MD       Or   phenol (CHLORASEPTIC) mouth spray 1 spray  1 spray Mouth/Throat PRN Samson Frederic, MD   1 spray at 12/13/22 0401   methocarbamol (ROBAXIN) tablet 500 mg  500 mg Oral Q6H PRN Samson Frederic, MD   500 mg at 12/15/22 1610   Or   methocarbamol (ROBAXIN) 500 mg in dextrose 5 % 50 mL IVPB  500 mg Intravenous Q6H PRN Swinteck, Arlys John, MD       metoCLOPramide (REGLAN) tablet 5-10 mg  5-10 mg Oral Q8H PRN Swinteck, Arlys John, MD       Or   metoCLOPramide (REGLAN) injection 5-10 mg  5-10 mg Intravenous Q8H PRN Swinteck, Arlys John, MD       ondansetron [facility]) tablet 4 mg  4 mg Oral Q6H PRN Swinteck, Arlys John, MD       Or   ondansetron (ZOFRAN) injection 4 mg  4 mg Intravenous Q6H PRN Swinteck, Arlys John, MD       Oral care mouth rinse  15 mL Mouth Rinse PRN Marinda Elk, MD       oxyCODONE (Oxy IR/ROXICODONE) immediate release tablet 5 mg  5 mg Oral Q4H PRN Samson Frederic, MD   5 mg at 12/12/22 0934   pantoprazole (PROTONIX) EC tablet 40 mg  40 mg Oral Daily Samson Frederic, MD   40 mg at 12/15/22 9604   phenytoin (DILANTIN) chewable tablet 50 mg  50 mg Oral Daily Samson Frederic, MD   50 mg at 12/15/22 5409   phenytoin (DILANTIN) ER capsule 300 mg  300 mg Oral QHS Swinteck, Arlys John, MD   300 mg at 12/14/22 2148   polyethylene glycol (MIRALAX / GLYCOLAX) packet 17 g  17 g Oral Daily PRN Swinteck, Arlys John, MD       polyethylene  glycol (MIRALAX / GLYCOLAX) packet 17 g  17 g Oral Daily Marinda Elk, MD       primidone (MYSOLINE) tablet 150 mg  150 mg Oral QHS Samson Frederic, MD   150 mg at 12/14/22 2148   primidone (MYSOLINE) tablet 250 mg  250 mg Oral Daily Samson Frederic, MD   250 mg at 12/15/22 8119   senna (SENOKOT) tablet 8.6 mg  1 tablet Oral BID Samson Frederic, MD   8.6 mg at 12/14/22 2147   sodium chloride (OCEAN) 0.65 % nasal spray 1 spray  1 spray Each Nare PRN Marinda Elk, MD       sodium chloride tablet 2 g  2 g Oral BID  WC Swinteck, Arlys John, MD   2 g at 12/15/22 9381   verapamil (CALAN-SR) CR tablet 180 mg  180 mg Oral Daily Samson Frederic, MD   180 mg at 12/15/22 0175     Discharge Medications: Please see discharge summary for a list of discharge medications.  Relevant Imaging Results:  Relevant Lab Results:   Additional Information SSN: [id number]  Howell Rucks, RN

## 2022-12-15 NOTE — TOC Progression Note (Addendum)
Transition of Care [facility]) - Progression Note    Patient Details  Name: [name] MRN: [id number] Date of Birth: December 10, 1929  [facility] CM/SW Contact  Howell Rucks, RN Phone Number: 12/15/2022, 11:15 AM  Clinical Narrative:  PT eval completed, recommendation for short term rehab-SNF. Met with pt at bedside, pt sleepy, unable to stay awake for assessment. NCM outreached to [place] (pt's niece, HCPOA), agreeable to short term rehab, prefers [facility]. NCM outreached to [facility] [place], states they [facility] have a bed available today, requested bed in SNF HUB, await bed offer. TOC will continue to follow.   -2:28pm Call from [place] with [facility], extended bed offer, reports she spoke with pt's niece [name]), she will bring pt's clothes to facility and has a family meeting at facility on Friday, 8/16. PTAR for transportation. RM 701, nurse report [phone]. No further TOC needs identified.      Expected Discharge Plan: Skilled Nursing Facility Barriers to Discharge: No Barriers Identified  Expected Discharge Plan and Services   Discharge Planning Services: CM Consult   Living arrangements for the past 2 months: Single Family Home Expected Discharge Date: 12/15/22                                     Social Determinants of Health (SDOH) Interventions SDOH Screenings   Food Insecurity: No Food Insecurity (12/09/2022)  Housing: Low Risk  (12/09/2022)  Transportation Needs: No Transportation Needs (12/09/2022)  Utilities: Not At Risk (12/09/2022)  Depression (PHQ2-9): Low Risk  (09/18/2020)  Social Connections: Unknown (09/16/2021)   [facility]  Tobacco Use: Low Risk  (12/10/2022)    Readmission Risk Interventions    12/15/2022   11:12 AM 10/27/2022   12:30 PM 01/29/2022   10:17 AM  Readmission Risk Prevention Plan  Transportation Screening Complete Complete Complete  PCP or Specialist Appt within 3-5 Days  Complete Complete   HRI or Home Care Consult  Complete Complete  Social Work Consult for Recovery Care Planning/Counseling  Complete Complete  Palliative Care Screening   Complete  Medication Review [employer]) Complete Complete Complete  PCP or Specialist appointment within 3-5 days of discharge Complete    HRI or Home Care Consult Complete    SW Recovery Care/Counseling Consult Complete    Palliative Care Screening Not Applicable    Skilled Nursing Facility Complete

## 2022-12-15 NOTE — Plan of Care (Signed)
  Problem: Education: Goal: Knowledge of General Education information will improve Description: Including pain rating scale, medication(s)/side effects and non-pharmacologic comfort measures Outcome: Progressing   Problem: Coping: Goal: Level of anxiety will decrease Outcome: Progressing   Problem: Elimination: Goal: Will not experience complications related to bowel motility Outcome: Progressing   Problem: Pain Managment: Goal: General experience of comfort will improve Outcome: Progressing   Problem: Safety: Goal: Ability to remain free from injury will improve Outcome: Progressing   Problem: Skin Integrity: Goal: Risk for impaired skin integrity will decrease Outcome: Progressing   Problem: Pain Management: Goal: Pain level will decrease with appropriate interventions Outcome: Progressing

## 2022-12-15 NOTE — Discharge Summary (Signed)
Physician Discharge Summary  [name] RJJ:[id number] DOB: 28-Feb-1930 DOA: 12/09/2022  PCP: Verlon Au, MD  Admit date: 12/09/2022 Discharge date: 12/15/2022  Admitted From: Home Disposition:  SNF  Recommendations for Outpatient Follow-up:  Follow up  orthopedic in 1-2 weeks Please obtain BMP/CBC in one week  Home Health:No Equipment/Devices:None  Discharge Condition:STable CODE STATUS:Full Diet recommendation: Heart Healthy   Brief/Interim Summary:  89 y.o. female past medical history significant for unspecified anemia, asthma/chronic bronchitis, right breast cancer history of radiation, carotid artery stenosis, diabetes mellitus type 2, brought into the ED via EMS after a fall, hemoglobin was 9 white blood cell count 8.3 chest x-ray showed no acute findings she was complaining of left hip pain and imaging showed minimally displaced subcapital femoral neck fracture with repeat surgery was consulted   Discharge Diagnoses:  Principal Problem:   Closed left hip fracture, initial encounter (HCC) Active Problems:   Uncontrolled type 2 diabetes mellitus with hyperglycemia, with long-term current use of insulin (HCC)   Mixed hyperlipidemia   ANEMIA-NOS   Essential hypertension   GERD   Seizure disorder (HCC)   Acquired hypothyroidism   Hyponatremia   Asthma   Constipation  Closed left hip fracture: Imaging shows subcapital femoral fracture. Orthopedic surgery was consulted she status post THR on 12/11/2022. Narcotics for analgesics she was placed on a bowel regimen. Physical therapy evaluated the patient, she will be discharged to skilled nursing facility.  Hyponatremia: In setting of diarrhea continue sodium tablets and cholestyramine her sodium remained in range uncontrolled diabetes mellitus type 2 with hyperglycemia: No changes made to her medication continue current regimen.  Dyslipidemia: Continue cholestyramine.  Anemia normocytic/acute blood loss  anemia: Postoperative loss hemoglobin dropped down to 6 show since recent units of packed red blood cells her hemoglobin on the discharge date.  Essential hypertension: Continue current medication.  Essential tremors: Continue per imaging.  Seizure disorder: Continue Dilantin.  Hypothyroidism: Continue Synthroid.  Discharge Instructions  Discharge Instructions     Diet - low sodium heart healthy   Complete by: As directed    Increase activity slowly   Complete by: As directed       Allergies as of 12/15/2022       Reactions   Bystolic [nebivolol Hcl] Other (See Comments)   "extreme weakness, heaviness in legs & arms, swelling in legs/arms/face, swollen abdomen, pain in bladder, feet pain, soreness in chest"   Carbamazepine Other (See Comments)   Blood poisoning   Cholestyramine Itching, Nausea And Vomiting, Rash, Other (See Comments)   "itching rash on stomach, bloated, nausea, vomiting, sleeplessness, extreme pain in arms"  Patient is taking this in 2022.   Morphine Other (See Comments)   Feels morbid, weak, still in pain   Niacin Palpitations   Fast heart beat Other reaction(s): Unknown   Norvasc [amlodipine Besylate] Other (See Comments)   "extreme fluid retention/pain)   Optivar [azelastine Hcl] Photosensitivity   Repaglinide Hives   Sular [nisoldipine Er] Other (See Comments)   "severe headaches, swelling eyes, hands, feet, shortness of breath, weak, flushed face, brain boiling, fluid retention, high blood sugar, nervous, heart fast beating"   Telmisartan Other (See Comments)   "headache, difficulty urinating, high blood sugar, fluid retention"   Amoxicillin-pot Clavulanate Rash   Tolerates Unasyn (>10 doses)   Cefdinir Swelling   Vaginal irritation, breathing,    Ciprofloxacin Hcl Hives   Clonidine Other (See Comments)   Dry mouth, fluid retention   Codeine Nausea And Vomiting  Ezetimibe Other (See Comments)   Made weak   Hydroxychloroquine Other (See  Comments)   Low platlets   Naproxen Other (See Comments)   Shrinks bladder   Sulfa Antibiotics Rash   Ziac [bisoprolol-hydrochlorothiazide] Other (See Comments)   "stopped urination"   Amlodipine Besy-benazepril Hcl Cough   Azelastine Other (See Comments)   Unknown reaction - Per MAR   Elavil [amitriptyline] Other (See Comments)   Gave Pt nightmares   Empagliflozin Other (See Comments)   "Caused yeast infection, slowed my urine"   Hydralazine Other (See Comments)   "does not reduce high blood pressure, pain in arm, high pressure, felt like I was on verge of heart attack, really weak"   Iodine I 131 Tositumomab Other (See Comments)   Unknown reaction - Per MAR   Kenalog [triamcinolone] Diarrhea   Per MAR   Keppra [levetiracetam] Other (See Comments)   Shaking   Lamotrigine Itching, Other (See Comments)   Pregabalin Swelling, Other (See Comments)   Weight gain   Pseudoephedrine Other (See Comments)   Unknown reaction   Risedronate    Unknown reaction - Per MAR   Ru-hist D [brompheniramine-phenylephrine] Other (See Comments)   Unknown reaction - Per MAR   Sumatriptan Other (See Comments)   Topiramate Other (See Comments)   Dry eyes   Ace Inhibitors Other (See Comments)   unknown   Aspirin Other (See Comments)   Unknown reaction   Atacand [candesartan] Other (See Comments)   Unknown reaction - MAR   Bextra [valdecoxib] Other (See Comments)   Unknown reaction - Per MAR   Bisoprolol-hydrochlorothiazide Other (See Comments)   Unknown reaction - Per MAR   Cefadroxil Other (See Comments)   Unknown reaction   Celecoxib Rash   Hydrocodone Other (See Comments)   unknown   Iodinated Contrast Media Rash, Other (See Comments)   "All over" rash   Meloxicam Other (See Comments)   unknown   Methylprednisolone Sodium Succinate Other (See Comments)   unknown   Nabumetone Other (See Comments)   Unknown reaction   Penicillins Other (See Comments)   unknown    Pseudoephedrine-guaifenesin Other (See Comments)   unknown   Rofecoxib Other (See Comments)   Unknown reaction - MAR Other reaction(s): Unknown   Ru-tuss [chlorphen-pse-atrop-hyos-scop] Other (See Comments)   unknown   Sulfonamide Derivatives Other (See Comments)   unknown   Sulphur [elemental Sulfur] Other (See Comments)   unknown   Telithromycin Other (See Comments)   unknown   Terfenadine Other (See Comments)   Unknown reaction - Per MAR   Trandolapril-verapamil Hcl Er Other (See Comments)   Headache, difficulty urinating, high blood sugar, fluid retention Pt is taking Tarka (trandolapril-verapamil) currently, but requests the medication stay in her allergy list   Valium [diazepam] Other (See Comments)   Makes her mean and hyper        Medication List     STOP taking these medications    Biotin 5000 MCG Caps   cholestyramine 4 g packet Commonly known as: QUESTRAN   cyanocobalamin 1000 MCG/ML injection Commonly known as: VITAMIN B12   diphenhydrAMINE 25 mg capsule Commonly known as: BENADRYL       TAKE these medications    apixaban 2.5 MG Tabs tablet Commonly known as: Eliquis Take 1 tablet (2.5 mg total) by mouth 2 (two) times daily.   capsicum 0.075 % topical cream Commonly known as: ZOSTRIX Apply 1 Application topically 4 (four) times daily as needed (pain).   cholestyramine light  4 g packet Commonly known as: PREVALITE Take 1 packet (4 g total) by mouth 2 (two) times daily.   diclofenac Sodium 1 % Gel Commonly known as: VOLTAREN Apply 2 g topically 4 (four) times daily as needed (for knee and back pain).   insulin aspart 100 UNIT/ML FlexPen Commonly known as: NOVOLOG Inject 2-12 Units into the skin See admin instructions. Injects 2-10 units of insulin under the skin 3 times a day per sliding scale: CBG 0-150: 2 units; CBG 151-200: 4 units; CBG 201-250: 6 units; CBG 251-300: 8 units; CBG 301-350: 10 units; CBG 351-400: 12 units; CBG >400: 12 units  and notify the MD/NP What changed: additional instructions   insulin aspart 100 UNIT/ML injection Commonly known as: novoLOG Inject 0-5 Units into the skin at bedtime. What changed: Another medication with the same name was changed. Make sure you understand how and when to take each.   insulin aspart 100 UNIT/ML injection Commonly known as: novoLOG Inject 0-6 Units into the skin 3 (three) times daily with meals. What changed: Another medication with the same name was changed. Make sure you understand how and when to take each.   Insulin Pen Needle 32G X 4 MM Misc Used to inject insulin 3x daily What changed:  how much to take how to take this when to take this   Lantus SoloStar 100 UNIT/ML Solostar Pen Generic drug: insulin glargine Inject 10 Units into the skin at bedtime. What changed:  when to take this additional instructions   levothyroxine 75 MCG tablet Commonly known as: SYNTHROID Take 1 tablet (75 mcg total) by mouth daily before breakfast.   mupirocin ointment 2 % Commonly known as: BACTROBAN Apply 1 Application topically 2 (two) times daily.   oxyCODONE 5 MG immediate release tablet Commonly known as: Roxicodone Take 1 tablet (5 mg total) by mouth every 4 (four) hours as needed for up to 7 days for severe pain. What changed:  when to take this reasons to take this   pantoprazole 40 MG tablet Commonly known as: PROTONIX Take 40 mg by mouth daily.   Phenytek 300 MG ER capsule Generic drug: phenytoin TAKE 1 CAPSULE AT BEDTIME   phenytoin 50 MG tablet Commonly known as: DILANTIN Chew 50 mg by mouth See admin instructions. Takes along with 300 mg when needed for tremor.   polyethylene glycol 17 g packet Commonly known as: MIRALAX / GLYCOLAX Take 17 g by mouth daily as needed for mild constipation. What changed: Another medication with the same name was added. Make sure you understand how and when to take each.   polyethylene glycol 17 g packet Commonly  known as: MIRALAX / GLYCOLAX Take 17 g by mouth daily as needed. What changed: You were already taking a medication with the same name, and this prescription was added. Make sure you understand how and when to take each.   primidone 50 MG tablet Commonly known as: MYSOLINE Take 3 tablets (150 mg total) by mouth at bedtime.   primidone 250 MG tablet Commonly known as: MYSOLINE Take 1 tablet (250 mg total) by mouth daily.   Prodigy Voice Blood Glucose w/Device Kit Use to check blood sugar 1 time per day.   Systane 0.4-0.3 % Soln Generic drug: Polyethyl Glycol-Propyl Glycol Place 2 drops into both eyes 3 (three) times daily.   thiamine 250 MG tablet Take 250 mg by mouth daily.   Verapamil HCl CR 200 MG Cp24 Take 200 mg by mouth daily.   Vitamin  D-3 125 MCG (5000 UT) Tabs Take 5,000 Units by mouth daily.        Follow-up Information     Clois Dupes, [place]. Schedule an appointment as soon as possible for a visit in 2 week(s).   Specialty: Orthopedic Surgery Why: For wound re-check Contact information: [address] [place] [address] [phone]                Allergies  Allergen Reactions   Bystolic [Nebivolol Hcl] Other (See Comments)    "extreme weakness, heaviness in legs & arms, swelling in legs/arms/face, swollen abdomen, pain in bladder, feet pain, soreness in chest"   Carbamazepine Other (See Comments)    Blood poisoning   Cholestyramine Itching, Nausea And Vomiting, Rash and Other (See Comments)    "itching rash on stomach, bloated, nausea, vomiting, sleeplessness, extreme pain in arms"  Patient is taking this in 2022.   Morphine Other (See Comments)    Feels morbid, weak, still in pain   Niacin Palpitations    Fast heart beat Other reaction(s): Unknown   Norvasc [Amlodipine Besylate] Other (See Comments)    "extreme fluid retention/pain)   Optivar [Azelastine Hcl] Photosensitivity   Repaglinide Hives   Sular [Nisoldipine Er]  Other (See Comments)    "severe headaches, swelling eyes, hands, feet, shortness of breath, weak, flushed face, brain boiling, fluid retention, high blood sugar, nervous, heart fast beating"   Telmisartan Other (See Comments)    "headache, difficulty urinating, high blood sugar, fluid retention"   Amoxicillin-Pot Clavulanate Rash    Tolerates Unasyn (>10 doses)   Cefdinir Swelling    Vaginal irritation, breathing,    Ciprofloxacin Hcl Hives   Clonidine Other (See Comments)    Dry mouth, fluid retention   Codeine Nausea And Vomiting   Ezetimibe Other (See Comments)    Made weak   Hydroxychloroquine Other (See Comments)    Low platlets   Naproxen Other (See Comments)    Shrinks bladder   Sulfa Antibiotics Rash   Ziac [Bisoprolol-Hydrochlorothiazide] Other (See Comments)    "stopped urination"   Amlodipine Besy-Benazepril Hcl Cough   Azelastine Other (See Comments)    Unknown reaction - Per MAR   Elavil [Amitriptyline] Other (See Comments)    Gave Pt nightmares   Empagliflozin Other (See Comments)    "Caused yeast infection, slowed my urine"   Hydralazine Other (See Comments)    "does not reduce high blood pressure, pain in arm, high pressure, felt like I was on verge of heart attack, really weak"   Iodine I 131 Tositumomab Other (See Comments)    Unknown reaction - Per MAR   Kenalog [Triamcinolone] Diarrhea    Per MAR   Keppra [Levetiracetam] Other (See Comments)    Shaking   Lamotrigine Itching and Other (See Comments)   Pregabalin Swelling and Other (See Comments)    Weight gain   Pseudoephedrine Other (See Comments)    Unknown reaction   Risedronate     Unknown reaction - Per MAR   Ru-Hist D [Brompheniramine-Phenylephrine] Other (See Comments)    Unknown reaction - Per MAR   Sumatriptan Other (See Comments)   Topiramate Other (See Comments)    Dry eyes   Ace Inhibitors Other (See Comments)    unknown   Aspirin Other (See Comments)    Unknown reaction   Atacand  [Candesartan] Other (See Comments)    Unknown reaction - MAR   Bextra [Valdecoxib] Other (See Comments)    Unknown  reaction - Per MAR   Bisoprolol-Hydrochlorothiazide Other (See Comments)    Unknown reaction - Per MAR   Cefadroxil Other (See Comments)    Unknown reaction   Celecoxib Rash   Hydrocodone Other (See Comments)    unknown   Iodinated Contrast Media Rash and Other (See Comments)    "All over" rash   Meloxicam Other (See Comments)    unknown   Methylprednisolone Sodium Succinate Other (See Comments)    unknown   Nabumetone Other (See Comments)    Unknown reaction   Penicillins Other (See Comments)    unknown   Pseudoephedrine-Guaifenesin Other (See Comments)    unknown   Rofecoxib Other (See Comments)    Unknown reaction - MAR Other reaction(s): Unknown   Ru-Tuss [Chlorphen-Pse-Atrop-Hyos-Scop] Other (See Comments)    unknown   Sulfonamide Derivatives Other (See Comments)    unknown   Sulphur [Elemental Sulfur] Other (See Comments)    unknown   Telithromycin Other (See Comments)    unknown   Terfenadine Other (See Comments)    Unknown reaction - Per MAR   Trandolapril-Verapamil Hcl Er Other (See Comments)    Headache, difficulty urinating, high blood sugar, fluid retention  Pt is taking Tarka (trandolapril-verapamil) currently, but requests the medication stay in her allergy list   Valium [Diazepam] Other (See Comments)    Makes her mean and hyper    Consultations: Orthopedic surgery   Procedures/Studies: DG HIP UNILAT WITH PELVIS 1V LEFT  Result Date: 12/12/2022 CLINICAL DATA:  Left hip arthroplasty EXAM: DG HIP (WITH OR WITHOUT PELVIS) 1V*L* COMPARISON:  Pelvis radiographs 12/09/2022 FINDINGS: Two C-arm fluoroscopic images were obtained intraoperatively and submitted for post operative interpretation. Postsurgical changes reflecting left hip arthroplasty with cerclage wire fixation are noted. There is no evidence of immediate complication. Fluoro time 28  seconds, dose 1.89 mGy. Please see the performing provider's procedural report for further detail. IMPRESSION: Status post left hip arthroplasty without evidence of immediate complication. Electronically Signed   By: Lesia Hausen M.D.   On: 12/12/2022 15:17   DG Pelvis Portable  Result Date: 12/12/2022 CLINICAL DATA:  Status post hip surgery EXAM: PORTABLE PELVIS 1-2 VIEWS COMPARISON:  Pelvis radiographs 06/11/2022 FINDINGS: There has been interval left hip arthroplasty with cerclage wire fixation. Hardware alignment is within expected limits, without evidence of complication. There is expected surrounding postoperative soft tissue gas. Right hip arthroplasty hardware is stable, without evidence of complication. The SI joints and symphysis pubis are intact. IMPRESSION: Status post left hip arthroplasty without evidence of complication. Electronically Signed   By: Lesia Hausen M.D.   On: 12/12/2022 15:15   DG C-Arm 1-60 Min-No Report  Result Date: 12/12/2022 Fluoroscopy was utilized by the requesting physician.  No radiographic interpretation.   DG C-Arm 1-60 Min-No Report  Result Date: 12/12/2022 Fluoroscopy was utilized by the requesting physician.  No radiographic interpretation.   DG Knee Left Port  Result Date: 12/11/2022 CLINICAL DATA:  Closed displaced fracture of left femoral neck after fall. Left knee pain. EXAM: PORTABLE LEFT KNEE - 1-2 VIEW COMPARISON:  Left knee radiographs 05/05/2022 FINDINGS: Diffuse decreased bone mineralization. Moderate to severe medial compartment joint space narrowing. Mild lateral compartment joint space narrowing. Mild-to-moderate peripheral degenerative spurring. Severe patellofemoral joint space narrowing. No acute fracture is seen. No dislocation. IMPRESSION: 1. No acute fracture is seen. 2. 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 Electronically Signed   By: Neita Garnet M.D.   On: 12/11/2022 13:44   ECHOCARDIOGRAM COMPLETE  Result  Date: 12/10/2022    ECHOCARDIOGRAM REPORT   Patient Name:   [name]  Date of Exam: 12/10/2022 Medical Rec #:  [id number]  Height:       67.0 in Accession #:    [id number] Weight:       143.3 lb Date of Birth:  1929-05-06 BSA:          1.755 m Patient Age:    89 years   BP:           148/65 mmHg Patient Gender: F          HR:           70 bpm. Exam Location:  Inpatient Procedure: 2D Echo, Cardiac Doppler and Color Doppler Indications:    Preoperative evaluation, abnormal EKG  History:        Patient has prior history of Echocardiogram examinations, most                 recent 12/14/2019. Carotid Disease; Risk Factors:Dyslipidemia,                 Hypertension and Diabetes. Seizure disorderm hx cancer,                 respiratory failure, hx PE.  Sonographer:    Wallie Char Referring Phys: [id number] DAVID MANUEL ORTIZ IMPRESSIONS  1. Left ventricular ejection fraction, by estimation, is 65 to 70%. The left ventricle has normal function. The left ventricle has no regional wall motion abnormalities. Left ventricular diastolic parameters are consistent with Grade I diastolic dysfunction (impaired relaxation).  2. LV intracavitary gradient of 21 mmHg due to hyperdynamic ventricle.  3. Right ventricular systolic function is normal. The right ventricular size is normal. Tricuspid regurgitation signal is inadequate for assessing PA pressure.  4. The mitral valve is grossly normal. Trivial mitral valve regurgitation. No evidence of mitral stenosis.  5. The aortic valve is tricuspid. There is mild calcification of the aortic valve. Aortic valve regurgitation is not visualized. No aortic stenosis is present. FINDINGS  Left Ventricle: Left ventricular ejection fraction, by estimation, is 65 to 70%. The left ventricle has normal function. The left ventricle has no regional wall motion abnormalities. The left ventricular internal cavity size was normal in size. There is  no left ventricular hypertrophy. Left ventricular diastolic  parameters are consistent with Grade I diastolic dysfunction (impaired relaxation). Right Ventricle: The right ventricular size is normal. No increase in right ventricular wall thickness. Right ventricular systolic function is normal. Tricuspid regurgitation signal is inadequate for assessing PA pressure. Left Atrium: Left atrial size was normal in size. Right Atrium: Right atrial size was normal in size. Pericardium: There is no evidence of pericardial effusion. Presence of epicardial fat layer. Mitral Valve: The mitral valve is grossly normal. Trivial mitral valve regurgitation. No evidence of mitral valve stenosis. MV peak gradient, 3.7 mmHg. The mean mitral valve gradient is 2.0 mmHg. Tricuspid Valve: The tricuspid valve is normal in structure. Tricuspid valve regurgitation is trivial. No evidence of tricuspid stenosis. Aortic Valve: The aortic valve is tricuspid. There is mild calcification of the aortic valve. Aortic valve regurgitation is not visualized. No aortic stenosis is present. Aortic valve mean gradient measures 4.0 mmHg. Aortic valve peak gradient measures 7.7 mmHg. Aortic valve area, by VTI measures 2.21 cm. Pulmonic Valve: The pulmonic valve was not well visualized. Pulmonic valve regurgitation is not visualized. No evidence of pulmonic stenosis. Aorta: The aortic root is normal in size and structure.  Venous: The inferior vena cava was not well visualized. IAS/Shunts: No atrial level shunt detected by color flow Doppler.  LEFT VENTRICLE PLAX 2D LVIDd:         3.90 cm     Diastology LVIDs:         2.40 cm     LV e' medial:    7.62 cm/s LV PW:         0.80 cm     LV E/e' medial:  9.5 LV IVS:        1.30 cm     LV e' lateral:   6.75 cm/s LVOT diam:     1.90 cm     LV E/e' lateral: 10.8 LV SV:         58 LV SV Index:   33 LVOT Area:     2.84 cm  LV Volumes (MOD) LV vol d, MOD A2C: 56.5 ml LV vol d, MOD A4C: 63.0 ml LV vol s, MOD A2C: 14.4 ml LV vol s, MOD A4C: 16.3 ml LV SV MOD A2C:     42.1 ml LV SV  MOD A4C:     63.0 ml LV SV MOD BP:      44.1 ml RIGHT VENTRICLE RV Basal diam:  2.60 cm RV S prime:     14.80 cm/s TAPSE (M-mode): 1.5 cm LEFT ATRIUM             Index        RIGHT ATRIUM          Index LA diam:        4.80 cm 2.73 cm/m   RA Area:     9.31 cm LA Vol (A2C):   45.7 ml 26.04 ml/m  RA Volume:   15.80 ml 9.00 ml/m LA Vol (A4C):   54.1 ml 30.82 ml/m LA Biplane Vol: 50.8 ml 28.94 ml/m  AORTIC VALVE AV Area (Vmax):    2.12 cm AV Area (Vmean):   2.15 cm AV Area (VTI):     2.21 cm AV Vmax:           139.00 cm/s AV Vmean:          97.600 cm/s AV VTI:            0.262 m AV Peak Grad:      7.7 mmHg AV Mean Grad:      4.0 mmHg LVOT Vmax:         104.00 cm/s LVOT Vmean:        73.950 cm/s LVOT VTI:          0.205 m LVOT/AV VTI ratio: 0.78  AORTA Ao Root diam: 3.30 cm Ao Asc diam:  3.30 cm MITRAL VALVE MV Area (PHT): 2.36 cm    SHUNTS MV Area VTI:   2.22 cm    Systemic VTI:  0.20 m MV Peak grad:  3.7 mmHg    Systemic Diam: 1.90 cm MV Mean grad:  2.0 mmHg MV Vmax:       0.97 m/s MV Vmean:      56.3 cm/s MV Decel Time: 322 msec MV E velocity: 72.70 cm/s MV A velocity: 92.40 cm/s MV E/A ratio:  0.79 Weston Brass MD Electronically signed by Weston Brass MD Signature Date/Time: 12/10/2022/2:26:30 PM    Final    DG Chest 1 View  Result Date: 12/09/2022 CLINICAL DATA:  Pain after fall EXAM: CHEST  1 VIEW COMPARISON:  12/21/2021 FINDINGS: No consolidation, pneumothorax or effusion. No edema. Normal  cardiopericardial silhouette. Tortuous aorta. Overlapping cardiac leads. Surgical clips in the right axillary region. IMPRESSION: No acute cardiopulmonary disease. Electronically Signed   By: Karen Kays M.D.   On: 12/09/2022 14:42   DG Hip Unilat With Pelvis 2-3 Views Left  Result Date: 12/09/2022 CLINICAL DATA:  Pain after fall EXAM: DG HIP (WITH OR WITHOUT PELVIS) 3V LEFT COMPARISON:  X-ray 06/11/2022 FINDINGS: There is a minimally displaced subcapital femoral neck fracture of the left hip. Osteopenia.  Right hemiarthroplasty identified. Expected alignment. The upper aspect of the pelvis is clipped off the edge of the film. IMPRESSION: Mm displaced subcapital femoral neck fracture of the left hip. Osteopenia. Right hip hemiarthroplasty Electronically Signed   By: Karen Kays M.D.   On: 12/09/2022 14:41   (Echo, Carotid, EGD, Colonoscopy, ERCP)    Subjective: No complaints  Discharge Exam: Vitals:   12/14/22 2111 12/15/22 0513  BP: (!) 134/50 (!) 137/54  Pulse:  (!) 50  Resp: 16 16  Temp: 100.3 F (37.9 C) 99.1 F (37.3 C)  SpO2: 96% 93%   Vitals:   12/14/22 1450 12/14/22 1505 12/14/22 2111 12/15/22 0513  BP: 127/66 127/66 (!) 134/50 (!) 137/54  Pulse: 83 83  (!) 50  Resp: 18 20 16 16   Temp: 98.7 F (37.1 C) 98.7 F (37.1 C) 100.3 F (37.9 C) 99.1 F (37.3 C)  TempSrc: Oral Oral Oral Oral  SpO2: 94% 94% 96% 93%  Weight:      Height:        General: Pt is alert, awake, not in acute distress Cardiovascular: RRR, S1/S2 +, no rubs, no gallops Respiratory: CTA bilaterally, no wheezing, no rhonchi Abdominal: Soft, NT, ND, bowel sounds + Extremities: no edema, no cyanosis    The results of significant diagnostics from this hospitalization (including imaging, microbiology, ancillary and laboratory) are listed below for reference.     Microbiology: Recent Results (from the past 240 hour(s))  Surgical PCR screen     Status: None   Collection Time: 12/11/22  9:31 PM   Specimen: Nasal Mucosa; Nasal Swab  Result Value Ref Range Status   MRSA, PCR NEGATIVE NEGATIVE Final   Staphylococcus aureus NEGATIVE NEGATIVE Final    Comment: (NOTE) The Xpert SA Assay (FDA approved for NASAL specimens in patients 90 years of age and older), is one component of a comprehensive surveillance program. It is not intended to diagnose infection nor to guide or monitor treatment. Performed at [facility], [address], [place], [place] [address]      Labs: BNP  (last 3 results) No results for input(s): "BNP" in the last 8760 hours. Basic Metabolic Panel: Recent Labs  Lab 12/09/22 1319 12/10/22 0413 12/13/22 0818  NA 125* 127* 127*  K 4.4 3.7 3.9  CL 94* 98 100  CO2 21* 19* 19*  GLUCOSE 180* 146* 181*  BUN 13 9 9   CREATININE 0.60 0.53 0.64  CALCIUM 8.5* 7.6* 7.7*   Liver Function Tests: Recent Labs  Lab 12/10/22 0413  AST 23  ALT 14  ALKPHOS 75  BILITOT 0.3  PROT 5.2*  ALBUMIN 2.6*   No results for input(s): "LIPASE", "AMYLASE" in the last 168 hours. No results for input(s): "AMMONIA" in the last 168 hours. CBC: Recent Labs  Lab 12/09/22 1319 12/10/22 0413 12/13/22 0818 12/14/22 0405 12/14/22 1540  WBC 8.3 10.1 11.2* 8.4  --   NEUTROABS 6.6  --   --   --   --   HGB 9.7*  9.9* 7.1* 6.7* 8.6*  HCT 30.7* 30.8* 21.9* 20.7* 26.3*  MCV 93.3 92.2 93.6 92.8  --   PLT 293 315 252 284  --    Cardiac Enzymes: No results for input(s): "CKTOTAL", "CKMB", "CKMBINDEX", "TROPONINI" in the last 168 hours. BNP: Invalid input(s): "POCBNP" CBG: Recent Labs  Lab 12/13/22 2044 12/14/22 0759 12/14/22 1126 12/14/22 1632 12/14/22 2112  GLUCAP 176* 174* 186* 154* 168*   D-Dimer No results for input(s): "DDIMER" in the last 72 hours. Hgb A1c No results for input(s): "HGBA1C" in the last 72 hours. Lipid Profile No results for input(s): "CHOL", "HDL", "LDLCALC", "TRIG", "CHOLHDL", "LDLDIRECT" in the last 72 hours. Thyroid function studies No results for input(s): "TSH", "T4TOTAL", "T3FREE", "THYROIDAB" in the last 72 hours.  Invalid input(s): "FREET3" Anemia work up No results for input(s): "VITAMINB12", "FOLATE", "FERRITIN", "TIBC", "IRON", "RETICCTPCT" in the last 72 hours. Urinalysis    Component Value Date/Time   COLORURINE YELLOW 10/24/2022 2131   APPEARANCEUR CLEAR 10/24/2022 2131   LABSPEC 1.008 10/24/2022 2131   PHURINE 5.0 10/24/2022 2131   GLUCOSEU NEGATIVE 10/24/2022 2131   GLUCOSEU NEGATIVE 12/28/2013 1036    HGBUR NEGATIVE 10/24/2022 2131   HGBUR trace-intact 06/29/2007 1304   BILIRUBINUR NEGATIVE 10/24/2022 2131   BILIRUBINUR negative 01/19/2017 1049   KETONESUR NEGATIVE 10/24/2022 2131   PROTEINUR NEGATIVE 10/24/2022 2131   UROBILINOGEN 0.2 01/19/2017 1049   UROBILINOGEN 1.0 03/25/2015 1755   NITRITE NEGATIVE 10/24/2022 2131   LEUKOCYTESUR SMALL (A) 10/24/2022 2131   Sepsis Labs Recent Labs  Lab 12/09/22 1319 12/10/22 0413 12/13/22 0818 12/14/22 0405  WBC 8.3 10.1 11.2* 8.4   Microbiology Recent Results (from the past 240 hour(s))  Surgical PCR screen     Status: None   Collection Time: 12/11/22  9:31 PM   Specimen: Nasal Mucosa; Nasal Swab  Result Value Ref Range Status   MRSA, PCR NEGATIVE NEGATIVE Final   Staphylococcus aureus NEGATIVE NEGATIVE Final    Comment: (NOTE) The Xpert SA Assay (FDA approved for NASAL specimens in patients 50 years of age and older), is one component of a comprehensive surveillance program. It is not intended to diagnose infection nor to guide or monitor treatment. Performed at [facility], [address], [place], [place] [address]      SIGNED:   Marinda Elk, MD  [facility] 12/15/2022, 7:35 AM Pager   If 7PM-7AM, please contact night-coverage [email or web address] Password [email or web address]

## 2022-12-15 NOTE — Plan of Care (Signed)
Reinforce education. Monitor pain and treat as needed. Turn and/or reposition every 2hrs. Elevate LLE. Monitor blood glucose levels. Encourage po intake.

## 2022-12-15 NOTE — Progress Notes (Signed)
Patient was given discharge orders to go to [facility]. Report was given to [name] Malta, LPN at [facility] about patient. All discharge paperwork/instructions are in discharge envelope ready to go with patient. [facility] has been called. Just waiting for [facility] to pick patient up to take patient to [facility], [place]. Will pass along to night shift RN to call patient's niece [name]) at [phone] when patient leaves.

## 2022-12-26 ENCOUNTER — Inpatient Hospital Stay (HOSPITAL_COMMUNITY): Payer: Medicare Other

## 2022-12-26 ENCOUNTER — Emergency Department (HOSPITAL_COMMUNITY): Payer: Medicare Other

## 2022-12-26 ENCOUNTER — Inpatient Hospital Stay (HOSPITAL_COMMUNITY)
Admission: EM | Admit: 2022-12-26 | Discharge: 2023-01-03 | DRG: 690 | Disposition: A | Discharge status: Skilled Nursing Facility | Payer: Medicare Other | Source: Skilled Nursing Facility | Attending: Internal Medicine | Admitting: Internal Medicine

## 2022-12-26 ENCOUNTER — Other Ambulatory Visit: Payer: Self-pay

## 2022-12-26 ENCOUNTER — Encounter (HOSPITAL_COMMUNITY): Payer: Self-pay

## 2022-12-26 DIAGNOSIS — Z8349 Family history of other endocrine, nutritional and metabolic diseases: Secondary | ICD-10-CM

## 2022-12-26 DIAGNOSIS — K5900 Constipation, unspecified: Secondary | ICD-10-CM | POA: Diagnosis not present

## 2022-12-26 DIAGNOSIS — Z8042 Family history of malignant neoplasm of prostate: Secondary | ICD-10-CM

## 2022-12-26 DIAGNOSIS — L89152 Pressure ulcer of sacral region, stage 2: Secondary | ICD-10-CM | POA: Insufficient documentation

## 2022-12-26 DIAGNOSIS — N12 Tubulo-interstitial nephritis, not specified as acute or chronic: Secondary | ICD-10-CM | POA: Insufficient documentation

## 2022-12-26 DIAGNOSIS — Z794 Long term (current) use of insulin: Secondary | ICD-10-CM | POA: Diagnosis not present

## 2022-12-26 DIAGNOSIS — I10 Essential (primary) hypertension: Secondary | ICD-10-CM | POA: Diagnosis present

## 2022-12-26 DIAGNOSIS — Z886 Allergy status to analgesic agent status: Secondary | ICD-10-CM

## 2022-12-26 DIAGNOSIS — R609 Edema, unspecified: Secondary | ICD-10-CM | POA: Diagnosis not present

## 2022-12-26 DIAGNOSIS — N3000 Acute cystitis without hematuria: Principal | ICD-10-CM | POA: Diagnosis present

## 2022-12-26 DIAGNOSIS — E871 Hypo-osmolality and hyponatremia: Secondary | ICD-10-CM | POA: Diagnosis present

## 2022-12-26 DIAGNOSIS — Z66 Do not resuscitate: Secondary | ICD-10-CM | POA: Diagnosis present

## 2022-12-26 DIAGNOSIS — R55 Syncope and collapse: Secondary | ICD-10-CM | POA: Diagnosis not present

## 2022-12-26 DIAGNOSIS — Z7901 Long term (current) use of anticoagulants: Secondary | ICD-10-CM

## 2022-12-26 DIAGNOSIS — Y838 Other surgical procedures as the cause of abnormal reaction of the patient, or of later complication, without mention of misadventure at the time of the procedure: Secondary | ICD-10-CM | POA: Diagnosis present

## 2022-12-26 DIAGNOSIS — D75839 Thrombocytosis, unspecified: Secondary | ICD-10-CM | POA: Diagnosis present

## 2022-12-26 DIAGNOSIS — K838 Other specified diseases of biliary tract: Secondary | ICD-10-CM | POA: Diagnosis present

## 2022-12-26 DIAGNOSIS — Z8719 Personal history of other diseases of the digestive system: Secondary | ICD-10-CM

## 2022-12-26 DIAGNOSIS — Z85038 Personal history of other malignant neoplasm of large intestine: Secondary | ICD-10-CM

## 2022-12-26 DIAGNOSIS — G40909 Epilepsy, unspecified, not intractable, without status epilepticus: Secondary | ICD-10-CM

## 2022-12-26 DIAGNOSIS — R5381 Other malaise: Secondary | ICD-10-CM

## 2022-12-26 DIAGNOSIS — Z800 Family history of malignant neoplasm of digestive organs: Secondary | ICD-10-CM

## 2022-12-26 DIAGNOSIS — N390 Urinary tract infection, site not specified: Secondary | ICD-10-CM

## 2022-12-26 DIAGNOSIS — M810 Age-related osteoporosis without current pathological fracture: Secondary | ICD-10-CM | POA: Diagnosis present

## 2022-12-26 DIAGNOSIS — J4489 Other specified chronic obstructive pulmonary disease: Secondary | ICD-10-CM | POA: Diagnosis present

## 2022-12-26 DIAGNOSIS — R410 Disorientation, unspecified: Secondary | ICD-10-CM | POA: Diagnosis not present

## 2022-12-26 DIAGNOSIS — Z7984 Long term (current) use of oral hypoglycemic drugs: Secondary | ICD-10-CM

## 2022-12-26 DIAGNOSIS — Z882 Allergy status to sulfonamides status: Secondary | ICD-10-CM

## 2022-12-26 DIAGNOSIS — Z923 Personal history of irradiation: Secondary | ICD-10-CM

## 2022-12-26 DIAGNOSIS — Z881 Allergy status to other antibiotic agents status: Secondary | ICD-10-CM

## 2022-12-26 DIAGNOSIS — E8770 Fluid overload, unspecified: Secondary | ICD-10-CM | POA: Diagnosis not present

## 2022-12-26 DIAGNOSIS — E876 Hypokalemia: Secondary | ICD-10-CM | POA: Diagnosis present

## 2022-12-26 DIAGNOSIS — K8689 Other specified diseases of pancreas: Secondary | ICD-10-CM

## 2022-12-26 DIAGNOSIS — E039 Hypothyroidism, unspecified: Secondary | ICD-10-CM | POA: Diagnosis present

## 2022-12-26 DIAGNOSIS — Z90710 Acquired absence of both cervix and uterus: Secondary | ICD-10-CM

## 2022-12-26 DIAGNOSIS — K469 Unspecified abdominal hernia without obstruction or gangrene: Secondary | ICD-10-CM

## 2022-12-26 DIAGNOSIS — E119 Type 2 diabetes mellitus without complications: Secondary | ICD-10-CM | POA: Diagnosis present

## 2022-12-26 DIAGNOSIS — L7632 Postprocedural hematoma of skin and subcutaneous tissue following other procedure: Secondary | ICD-10-CM | POA: Diagnosis present

## 2022-12-26 DIAGNOSIS — B9620 Unspecified Escherichia coli [E. coli] as the cause of diseases classified elsewhere: Secondary | ICD-10-CM | POA: Diagnosis present

## 2022-12-26 DIAGNOSIS — Z79890 Hormone replacement therapy (postmenopausal): Secondary | ICD-10-CM

## 2022-12-26 DIAGNOSIS — K869 Disease of pancreas, unspecified: Secondary | ICD-10-CM | POA: Diagnosis present

## 2022-12-26 DIAGNOSIS — Z85820 Personal history of malignant melanoma of skin: Secondary | ICD-10-CM

## 2022-12-26 DIAGNOSIS — Z8249 Family history of ischemic heart disease and other diseases of the circulatory system: Secondary | ICD-10-CM

## 2022-12-26 DIAGNOSIS — S72002A Fracture of unspecified part of neck of left femur, initial encounter for closed fracture: Secondary | ICD-10-CM | POA: Diagnosis not present

## 2022-12-26 DIAGNOSIS — Z833 Family history of diabetes mellitus: Secondary | ICD-10-CM

## 2022-12-26 DIAGNOSIS — Z8262 Family history of osteoporosis: Secondary | ICD-10-CM

## 2022-12-26 DIAGNOSIS — K219 Gastro-esophageal reflux disease without esophagitis: Secondary | ICD-10-CM | POA: Diagnosis present

## 2022-12-26 DIAGNOSIS — K5641 Fecal impaction: Secondary | ICD-10-CM | POA: Diagnosis present

## 2022-12-26 DIAGNOSIS — Z91128 Patient's intentional underdosing of medication regimen for other reason: Secondary | ICD-10-CM

## 2022-12-26 DIAGNOSIS — Z86011 Personal history of benign neoplasm of the brain: Secondary | ICD-10-CM

## 2022-12-26 DIAGNOSIS — Z85828 Personal history of other malignant neoplasm of skin: Secondary | ICD-10-CM | POA: Diagnosis not present

## 2022-12-26 DIAGNOSIS — L8990 Pressure ulcer of unspecified site, unspecified stage: Secondary | ICD-10-CM | POA: Insufficient documentation

## 2022-12-26 DIAGNOSIS — E785 Hyperlipidemia, unspecified: Secondary | ICD-10-CM | POA: Diagnosis present

## 2022-12-26 DIAGNOSIS — Z96643 Presence of artificial hip joint, bilateral: Secondary | ICD-10-CM | POA: Diagnosis present

## 2022-12-26 DIAGNOSIS — M25552 Pain in left hip: Secondary | ICD-10-CM | POA: Diagnosis present

## 2022-12-26 DIAGNOSIS — T45516A Underdosing of anticoagulants, initial encounter: Secondary | ICD-10-CM | POA: Diagnosis present

## 2022-12-26 DIAGNOSIS — Z888 Allergy status to other drugs, medicaments and biological substances status: Secondary | ICD-10-CM

## 2022-12-26 DIAGNOSIS — R531 Weakness: Secondary | ICD-10-CM | POA: Diagnosis present

## 2022-12-26 DIAGNOSIS — E559 Vitamin D deficiency, unspecified: Secondary | ICD-10-CM | POA: Diagnosis present

## 2022-12-26 DIAGNOSIS — R52 Pain, unspecified: Secondary | ICD-10-CM

## 2022-12-26 DIAGNOSIS — Z853 Personal history of malignant neoplasm of breast: Secondary | ICD-10-CM

## 2022-12-26 DIAGNOSIS — Z880 Allergy status to penicillin: Secondary | ICD-10-CM | POA: Diagnosis not present

## 2022-12-26 DIAGNOSIS — Z91041 Radiographic dye allergy status: Secondary | ICD-10-CM

## 2022-12-26 DIAGNOSIS — Z79899 Other long term (current) drug therapy: Secondary | ICD-10-CM

## 2022-12-26 DIAGNOSIS — Z9049 Acquired absence of other specified parts of digestive tract: Secondary | ICD-10-CM

## 2022-12-26 LAB — CBC
HCT: 30.7 % — ABNORMAL LOW (ref 36.0–46.0)
Hemoglobin: 10 g/dL — ABNORMAL LOW (ref 12.0–15.0)
MCH: 30.3 pg (ref 26.0–34.0)
MCHC: 32.6 g/dL (ref 30.0–36.0)
MCV: 93 fL (ref 80.0–100.0)
Platelets: 571 10*3/uL — ABNORMAL HIGH (ref 150–400)
RBC: 3.3 MIL/uL — ABNORMAL LOW (ref 3.87–5.11)
RDW: 15.6 % — ABNORMAL HIGH (ref 11.5–15.5)
WBC: 12.6 10*3/uL — ABNORMAL HIGH (ref 4.0–10.5)
nRBC: 0 % (ref 0.0–0.2)

## 2022-12-26 LAB — COMPREHENSIVE METABOLIC PANEL
ALT: 13 U/L (ref 0–44)
AST: 15 U/L (ref 15–41)
Albumin: 2.5 g/dL — ABNORMAL LOW (ref 3.5–5.0)
Alkaline Phosphatase: 114 U/L (ref 38–126)
Anion gap: 8 (ref 5–15)
BUN: 7 mg/dL — ABNORMAL LOW (ref 8–23)
CO2: 21 mmol/L — ABNORMAL LOW (ref 22–32)
Calcium: 7.7 mg/dL — ABNORMAL LOW (ref 8.9–10.3)
Chloride: 98 mmol/L (ref 98–111)
Creatinine, Ser: 0.49 mg/dL (ref 0.44–1.00)
GFR, Estimated: >60 mL/min (ref >60)
Glucose, Bld: 133 mg/dL — ABNORMAL HIGH (ref 70–99)
Potassium: 3.6 mmol/L (ref 3.5–5.1)
Sodium: 127 mmol/L — ABNORMAL LOW (ref 135–145)
Total Bilirubin: 0.5 mg/dL (ref 0.3–1.2)
Total Protein: 5.1 g/dL — ABNORMAL LOW (ref 6.5–8.1)

## 2022-12-26 LAB — URINALYSIS, ROUTINE W REFLEX MICROSCOPIC
Bilirubin Urine: NEGATIVE
Glucose, UA: NEGATIVE mg/dL
Ketones, ur: NEGATIVE mg/dL
Nitrite: NEGATIVE
Protein, ur: 100 mg/dL — AB
Specific Gravity, Urine: 1.006 (ref 1.005–1.030)
WBC, UA: >50 WBC/hpf (ref 0–5)
pH: 6 (ref 5.0–8.0)

## 2022-12-26 LAB — CBC WITH DIFFERENTIAL/PLATELET
Abs Immature Granulocytes: 0.1 10*3/uL — ABNORMAL HIGH (ref 0.00–0.07)
Basophils Absolute: 0.1 10*3/uL (ref 0.0–0.1)
Basophils Relative: 1 %
Eosinophils Absolute: 0.3 10*3/uL (ref 0.0–0.5)
Eosinophils Relative: 2 %
HCT: 31.4 % — ABNORMAL LOW (ref 36.0–46.0)
Hemoglobin: 10.5 g/dL — ABNORMAL LOW (ref 12.0–15.0)
Immature Granulocytes: 1 %
Lymphocytes Relative: 15 %
Lymphs Abs: 1.7 10*3/uL (ref 0.7–4.0)
MCH: 30.3 pg (ref 26.0–34.0)
MCHC: 33.4 g/dL (ref 30.0–36.0)
MCV: 90.5 fL (ref 80.0–100.0)
Monocytes Absolute: 0.6 10*3/uL (ref 0.1–1.0)
Monocytes Relative: 6 %
Neutro Abs: 8.6 10*3/uL — ABNORMAL HIGH (ref 1.7–7.7)
Neutrophils Relative %: 75 %
Platelets: 765 10*3/uL — ABNORMAL HIGH (ref 150–400)
RBC: 3.47 MIL/uL — ABNORMAL LOW (ref 3.87–5.11)
RDW: 15.6 % — ABNORMAL HIGH (ref 11.5–15.5)
WBC: 11.4 10*3/uL — ABNORMAL HIGH (ref 4.0–10.5)
nRBC: 0 % (ref 0.0–0.2)

## 2022-12-26 LAB — TROPONIN I (HIGH SENSITIVITY): Troponin I (High Sensitivity): 5 ng/L (ref <18)

## 2022-12-26 LAB — GLUCOSE, CAPILLARY: Glucose-Capillary: 149 mg/dL — ABNORMAL HIGH (ref 70–99)

## 2022-12-26 LAB — CREATININE, SERUM
Creatinine, Ser: 0.53 mg/dL (ref 0.44–1.00)
GFR, Estimated: >60 mL/min (ref >60)

## 2022-12-26 LAB — LIPASE, BLOOD: Lipase: 55 U/L — ABNORMAL HIGH (ref 11–51)

## 2022-12-26 MED ORDER — ACETAMINOPHEN 325 MG PO TABS
650.0000 mg | ORAL_TABLET | ORAL | Status: DC
  Administered 2022-12-26 – 2022-12-27 (×3): 650 mg via ORAL
  Filled 2022-12-26 (×3): qty 2

## 2022-12-26 MED ORDER — INSULIN ASPART 100 UNIT/ML IJ SOLN
0.0000 [IU] | Freq: Every day | INTRAMUSCULAR | Status: DC
  Administered 2022-12-27: 3 [IU] via SUBCUTANEOUS

## 2022-12-26 MED ORDER — ACETAMINOPHEN 650 MG RE SUPP: 650.0000 mg | Freq: Four times a day (QID) | RECTAL | Status: DC | PRN

## 2022-12-26 MED ORDER — DICLOFENAC SODIUM 1 % EX GEL
2.0000 g | Freq: Four times a day (QID) | CUTANEOUS | Status: DC | PRN
  Administered 2022-12-27 – 2022-12-28 (×2): 2 g via TOPICAL
  Filled 2022-12-26: qty 100

## 2022-12-26 MED ORDER — INSULIN GLARGINE-YFGN 100 UNIT/ML ~~LOC~~ SOLN
13.0000 [IU] | Freq: Every day | SUBCUTANEOUS | Status: DC
  Administered 2022-12-26 – 2023-01-02 (×7): 13 [IU] via SUBCUTANEOUS
  Filled 2022-12-26 (×11): qty 0.13

## 2022-12-26 MED ORDER — HYDROMORPHONE HCL 1 MG/ML IJ SOLN
0.5000 mg | Freq: Once | INTRAMUSCULAR | Status: AC
  Administered 2022-12-26: 0.5 mg via INTRAVENOUS
  Filled 2022-12-26: qty 1

## 2022-12-26 MED ORDER — ENOXAPARIN SODIUM 40 MG/0.4ML IJ SOSY: 40.0000 mg | PREFILLED_SYRINGE | INTRAMUSCULAR | Status: DC

## 2022-12-26 MED ORDER — VERAPAMIL HCL ER 180 MG PO TBCR
180.0000 mg | EXTENDED_RELEASE_TABLET | Freq: Every day | ORAL | Status: DC
  Administered 2022-12-27 – 2023-01-03 (×8): 180 mg via ORAL
  Filled 2022-12-26 (×8): qty 1

## 2022-12-26 MED ORDER — POLYVINYL ALCOHOL 1.4 % OP SOLN
2.0000 [drp] | Freq: Three times a day (TID) | OPHTHALMIC | Status: DC
  Administered 2022-12-26 – 2023-01-03 (×23): 2 [drp] via OPHTHALMIC
  Filled 2022-12-26: qty 15

## 2022-12-26 MED ORDER — HYDROMORPHONE HCL 1 MG/ML IJ SOLN
0.5000 mg | INTRAMUSCULAR | Status: DC | PRN
  Administered 2022-12-27 – 2022-12-31 (×11): 0.5 mg via INTRAVENOUS
  Filled 2022-12-26 (×11): qty 0.5

## 2022-12-26 MED ORDER — POLYETHYLENE GLYCOL 3350 17 G PO PACK: 17.0000 g | PACK | Freq: Every day | ORAL | Status: DC | PRN

## 2022-12-26 MED ORDER — THIAMINE MONONITRATE 100 MG PO TABS
250.0000 mg | ORAL_TABLET | Freq: Every day | ORAL | Status: DC
  Administered 2022-12-27 – 2023-01-03 (×8): 250 mg via ORAL
  Filled 2022-12-26 (×8): qty 3

## 2022-12-26 MED ORDER — INSULIN ASPART 100 UNIT/ML IJ SOLN
0.0000 [IU] | Freq: Three times a day (TID) | INTRAMUSCULAR | Status: DC
  Administered 2022-12-27: 5 [IU] via SUBCUTANEOUS
  Administered 2022-12-27: 3 [IU] via SUBCUTANEOUS
  Administered 2022-12-28: 5 [IU] via SUBCUTANEOUS
  Administered 2022-12-28: 3 [IU] via SUBCUTANEOUS
  Administered 2022-12-28 – 2022-12-29 (×2): 2 [IU] via SUBCUTANEOUS
  Administered 2022-12-29: 3 [IU] via SUBCUTANEOUS
  Administered 2022-12-29: 5 [IU] via SUBCUTANEOUS
  Administered 2022-12-30: 3 [IU] via SUBCUTANEOUS
  Administered 2022-12-31 (×2): 5 [IU] via SUBCUTANEOUS
  Administered 2023-01-01 – 2023-01-02 (×4): 3 [IU] via SUBCUTANEOUS
  Administered 2023-01-02: 5 [IU] via SUBCUTANEOUS
  Administered 2023-01-03: 3 [IU] via SUBCUTANEOUS

## 2022-12-26 MED ORDER — OXYCODONE HCL 5 MG PO TABS
2.5000 mg | ORAL_TABLET | Freq: Four times a day (QID) | ORAL | Status: DC | PRN
  Administered 2022-12-27 – 2022-12-31 (×4): 2.5 mg via ORAL
  Filled 2022-12-26 (×4): qty 1

## 2022-12-26 MED ORDER — ACETAMINOPHEN 500 MG PO TABS
1000.0000 mg | ORAL_TABLET | Freq: Once | ORAL | Status: AC
  Administered 2022-12-26: 1000 mg via ORAL
  Filled 2022-12-26: qty 2

## 2022-12-26 MED ORDER — POLYETHYLENE GLYCOL 3350 17 G PO PACK
17.0000 g | PACK | Freq: Every day | ORAL | Status: DC
  Administered 2022-12-27: 17 g via ORAL
  Filled 2022-12-26: qty 1

## 2022-12-26 MED ORDER — PANTOPRAZOLE SODIUM 40 MG PO TBEC
40.0000 mg | DELAYED_RELEASE_TABLET | Freq: Every day | ORAL | Status: DC
  Administered 2022-12-27 – 2023-01-03 (×8): 40 mg via ORAL
  Filled 2022-12-26 (×8): qty 1

## 2022-12-26 MED ORDER — SODIUM CHLORIDE 0.9 % IV SOLN: 2.0000 g | INTRAVENOUS | Status: DC

## 2022-12-26 MED ORDER — VITAMIN D 25 MCG (1000 UNIT) PO TABS
5000.0000 [IU] | ORAL_TABLET | Freq: Every day | ORAL | Status: DC
  Administered 2022-12-27 – 2023-01-03 (×8): 5000 [IU] via ORAL
  Filled 2022-12-26 (×7): qty 5

## 2022-12-26 MED ORDER — SENNOSIDES-DOCUSATE SODIUM 8.6-50 MG PO TABS
2.0000 | ORAL_TABLET | Freq: Two times a day (BID) | ORAL | Status: DC
  Administered 2022-12-26 – 2022-12-30 (×8): 2 via ORAL
  Filled 2022-12-26 (×9): qty 2

## 2022-12-26 MED ORDER — SODIUM CHLORIDE 0.9% FLUSH
3.0000 mL | Freq: Two times a day (BID) | INTRAVENOUS | Status: DC
  Administered 2022-12-27 – 2023-01-03 (×13): 3 mL via INTRAVENOUS

## 2022-12-26 MED ORDER — OXYCODONE HCL 5 MG PO TABS
5.0000 mg | ORAL_TABLET | Freq: Once | ORAL | Status: AC
  Administered 2022-12-26: 5 mg via ORAL
  Filled 2022-12-26: qty 1

## 2022-12-26 MED ORDER — CHOLESTYRAMINE LIGHT 4 G PO PACK
4.0000 g | PACK | Freq: Two times a day (BID) | ORAL | Status: DC
  Administered 2022-12-26 – 2022-12-27 (×2): 4 g via ORAL
  Filled 2022-12-26 (×2): qty 1

## 2022-12-26 MED ORDER — ACETAMINOPHEN 325 MG PO TABS: 650.0000 mg | ORAL_TABLET | Freq: Four times a day (QID) | ORAL | Status: DC | PRN

## 2022-12-26 MED ORDER — PRIMIDONE 50 MG PO TABS
150.0000 mg | ORAL_TABLET | Freq: Every day | ORAL | Status: DC
  Administered 2022-12-26 – 2023-01-02 (×8): 150 mg via ORAL
  Filled 2022-12-26 (×9): qty 3

## 2022-12-26 MED ORDER — LACTATED RINGERS IV BOLUS: 500.0000 mL | Freq: Once | INTRAVENOUS | Status: DC

## 2022-12-26 MED ORDER — OXYCODONE HCL ER 10 MG PO T12A
10.0000 mg | EXTENDED_RELEASE_TABLET | Freq: Two times a day (BID) | ORAL | Status: DC
  Administered 2022-12-26: 10 mg via ORAL
  Filled 2022-12-26: qty 1

## 2022-12-26 MED ORDER — APIXABAN 2.5 MG PO TABS: 2.5000 mg | ORAL_TABLET | Freq: Two times a day (BID) | ORAL | Status: DC

## 2022-12-26 MED ORDER — PHENYTOIN SODIUM EXTENDED 100 MG PO CAPS
300.0000 mg | ORAL_CAPSULE | Freq: Every day | ORAL | Status: DC
  Administered 2022-12-26 – 2023-01-02 (×8): 300 mg via ORAL
  Filled 2022-12-26 (×8): qty 3

## 2022-12-26 MED ORDER — HYDROMORPHONE HCL 1 MG/ML IJ SOLN
0.2500 mg | INTRAMUSCULAR | Status: DC | PRN
  Administered 2022-12-26: 0.25 mg via INTRAVENOUS
  Filled 2022-12-26: qty 1

## 2022-12-26 MED ORDER — LEVOTHYROXINE SODIUM 75 MCG PO TABS
75.0000 ug | ORAL_TABLET | Freq: Every day | ORAL | Status: DC
  Administered 2022-12-27 – 2023-01-03 (×8): 75 ug via ORAL
  Filled 2022-12-26 (×8): qty 1

## 2022-12-26 MED ORDER — HYDROMORPHONE HCL 1 MG/ML IJ SOLN
0.5000 mg | INTRAMUSCULAR | Status: DC | PRN
  Administered 2022-12-26: 0.5 mg via INTRAVENOUS
  Filled 2022-12-26: qty 0.5

## 2022-12-26 MED ORDER — SODIUM CHLORIDE 0.9 % IV SOLN
2.0000 g | Freq: Once | INTRAVENOUS | Status: AC
  Administered 2022-12-26: 2 g via INTRAVENOUS
  Filled 2022-12-26: qty 20

## 2022-12-26 MED ORDER — VERAPAMIL HCL ER 200 MG PO CP24: 200.0000 mg | ORAL_CAPSULE | Freq: Every day | ORAL | Status: DC

## 2022-12-26 MED ORDER — PRIMIDONE 250 MG PO TABS
250.0000 mg | ORAL_TABLET | Freq: Every day | ORAL | Status: DC
  Administered 2022-12-27 – 2023-01-03 (×8): 250 mg via ORAL
  Filled 2022-12-26 (×8): qty 1

## 2022-12-26 NOTE — Assessment & Plan Note (Signed)
Seen on CAT scan, corroborated on physical exam, outpatient surgery evaluation

## 2022-12-26 NOTE — Assessment & Plan Note (Signed)
89, seems to be chronic, moderate, asymptomatic

## 2022-12-26 NOTE — H&P (Signed)
History and Physical    Patient: [name] EXB:[id number] DOB: 08/21/1929 DOA: 12/26/2022 DOS: the patient was seen and examined on 12/26/2022 PCP: Verlon Au, MD  Patient coming from: SNF  Chief Complaint:  Chief Complaint  Patient presents with   Hip Pain   HPI: [name] is a 89 y.o. female with medical history significant of closed left hip fracture on or about December 09, 2022.  S/p ORIF.  Further history is obtained from patient's niece and sister at the bedside as well as corroborated by the patient.  Patient reports that she has unable to be bearing weight since her surgery and has had a progressive loss of function of the lower EXTR left lower extremity.  Patient has been cared for at a SNF/rehab facility since then.  Patient was being turned yesterday when she apparently had her left lower extremity positioned in an awkward manner causing severe pain.  Patient is reporting pain from the left toe all the way to the left shoulder.  Since then.  There is no report of rigors dysuria fever loss of consciousness any other trauma.  Medical evaluation is sought.  Your workup is notable for urinary tract infection.  Patient has been started on antibiotic therapy.  Patient is still complaining of pain as above.  Reports exacerbation by movement or attempts to bear weight on the left lower extremity.  It is aching severe.  No relieving factor. Review of Systems: As mentioned in the history of present illness. All other systems reviewed and are negative. Past Medical History:  Diagnosis Date   Anemia    Anxiety    Asthma    Breast cancer (HCC) 2014   right breast   Cancer of right breast (HCC) 12/26/2012   right breast 12:00 o'clock, DCIS   Carotid artery disease (HCC)    Carpal tunnel syndrome, bilateral    Chronic bronchitis (HCC)    Chronic cough    Chronic diarrhea    3 years   Chronic facial pain    Chronic foot pain    Colon cancer (HCC) dx'd 11/2019   Complication of  anesthesia    Sore jaw; could not chew or move mouth, prolonged sedation   Convulsions/seizures (HCC) 10/16/2014   Diabetes mellitus    type 2 niddm x 20 years   Dyslipidemia    Ejection fraction    Gait abnormality 12/04/2019   GERD (gastroesophageal reflux disease)    Hammer toe    bilateral   History of colonic polyps    History of meningioma    HTN (hypertension)    Hx of radiation therapy 03/07/13- 03/29/13   right breast 4250 cGy 17 sessions   Hyperlipidemia    Hypokalemia    Hyponatremia    Hypothyroidism    IBS (irritable bowel syndrome)    Melanoma (HCC)    Metatarsal bone fracture 2014   Multiple drug allergies    Nausea & vomiting 03/12/2020   Nontoxic thyroid nodule    Obesity    Osteoarthritis    Osteoporosis    Palpitations    Personal history of radiation therapy 2014   Seasonal allergies    Skin cancer    Syncope    Tremor, essential 08/18/2016   Vitamin B12 deficiency    Vitamin D deficiency    Past Surgical History:  Procedure Laterality Date   ABDOMINAL HYSTERECTOMY     BIOPSY  11/07/2019   Procedure: BIOPSY;  Surgeon: Bernette Redbird, MD;  Location: [facility];  Service: Endoscopy;;   BRAIN SURGERY     BREAST BIOPSY Right 01/24/2013   Procedure: RE-EXCICION OF BREAST CANCER, ANTERIOR MARGINS;  Surgeon: Mariella Saa, MD;  Location: [facility];  Service: General;  Laterality: Right;   BREAST LUMPECTOMY Right 2014   BREAST LUMPECTOMY WITH NEEDLE LOCALIZATION Right 01/17/2013   Procedure: BREAST LUMPECTOMY WITH NEEDLE LOCALIZATION;  Surgeon: Mariella Saa, MD;  Location: [facility];  Service: General;  Laterality: Right;   BUNIONECTOMY Bilateral    CATARACT EXTRACTION W/ INTRAOCULAR LENS IMPLANT Right    CHOLECYSTECTOMY     COLONOSCOPY     COLONOSCOPY WITH PROPOFOL N/A 11/07/2019   Procedure: COLONOSCOPY WITH PROPOFOL;  Surgeon: Bernette Redbird, MD;  Location: [facility];  Service: Endoscopy;  Laterality: N/A;   CRANIOTOMY Right 10/18/2013    Procedure: CRANIOTOMY TUMOR EXCISION;  Surgeon: Karn Cassis, MD;  Location: [facility];  Service: Neurosurgery;  Laterality: Right;   ESOPHAGOGASTRODUODENOSCOPY (EGD) WITH PROPOFOL N/A 11/07/2019   Procedure: ESOPHAGOGASTRODUODENOSCOPY (EGD) WITH PROPOFOL;  Surgeon: Bernette Redbird, MD;  Location: [facility];  Service: Endoscopy;  Laterality: N/A;   EYE SURGERY     HERNIA REPAIR     IR THORACENTESIS ASP PLEURAL SPACE W/IMG GUIDE  04/01/2020   KNEE ARTHROSCOPY Bilateral    LAPAROSCOPIC RIGHT HEMI COLECTOMY Right 03/06/2020   Procedure: LAPAROSCOPIC RIGHT HEMI COLECTOMY WITH TAP BLOCK AND LYSIS OF ADHESIONS;  Surgeon: Andria Meuse, MD;  Location: [facility];  Service: General;  Laterality: Right;   POLYPECTOMY     small adenomatous   TOTAL HIP ARTHROPLASTY Right 06/05/2020   Procedure: ARTHROPLASTY  ANTERIOR APPROACH. RIGHT HIP;  Surgeon: Samson Frederic, MD;  Location: [facility];  Service: Orthopedics;  Laterality: Right;   TOTAL HIP ARTHROPLASTY Left 12/12/2022   Procedure: TOTAL HIP ARTHROPLASTY ANTERIOR APPROACH;  Surgeon: Samson Frederic, MD;  Location: [facility];  Service: Orthopedics;  Laterality: Left;   ULNAR TUNNEL RELEASE     Social History:  reports that she has never smoked. She has never used smokeless tobacco. She reports that she does not drink alcohol and does not use drugs.  Allergies  Allergen Reactions   Bystolic [Nebivolol Hcl] Other (See Comments)    "extreme weakness, heaviness in legs & arms, swelling in legs/arms/face, swollen abdomen, pain in bladder, feet pain, soreness in chest"   Carbamazepine Other (See Comments)    Blood poisoning   Cholestyramine Itching, Nausea And Vomiting, Rash and Other (See Comments)    "itching rash on stomach, bloated, nausea, vomiting, sleeplessness, extreme pain in arms"  Patient is taking this in 2022.   Morphine Other (See Comments)    Feels morbid, weak, still in pain   Niacin Palpitations    Fast heart beat Other  reaction(s): Unknown   Norvasc [Amlodipine Besylate] Other (See Comments)    "extreme fluid retention/pain)   Optivar [Azelastine Hcl] Photosensitivity   Repaglinide Hives   Sular [Nisoldipine Er] Other (See Comments)    "severe headaches, swelling eyes, hands, feet, shortness of breath, weak, flushed face, brain boiling, fluid retention, high blood sugar, nervous, heart fast beating"   Telmisartan Other (See Comments)    "headache, difficulty urinating, high blood sugar, fluid retention"   Amoxicillin-Pot Clavulanate Rash    Tolerates Unasyn (>10 doses)   Cefdinir Swelling    Vaginal irritation, breathing,    Ciprofloxacin Hcl Hives   Clonidine Other (See Comments)    Dry mouth, fluid retention   Codeine Nausea And Vomiting  Ezetimibe Other (See Comments)    Made weak   Hydroxychloroquine Other (See Comments)    Low platlets   Naproxen Other (See Comments)    Shrinks bladder   Sulfa Antibiotics Rash   Ziac [Bisoprolol-Hydrochlorothiazide] Other (See Comments)    "stopped urination"   Amlodipine Besy-Benazepril Hcl Cough   Azelastine Other (See Comments)    Unknown reaction - Per MAR   Elavil [Amitriptyline] Other (See Comments)    Gave Pt nightmares   Empagliflozin Other (See Comments)    "Caused yeast infection, slowed my urine"   Hydralazine Other (See Comments)    "does not reduce high blood pressure, pain in arm, high pressure, felt like I was on verge of heart attack, really weak"   Iodine I 131 Tositumomab Other (See Comments)    Unknown reaction - Per MAR   Kenalog [Triamcinolone] Diarrhea    Per MAR   Keppra [Levetiracetam] Other (See Comments)    Shaking   Lamotrigine Itching and Other (See Comments)   Pregabalin Swelling and Other (See Comments)    Weight gain   Pseudoephedrine Other (See Comments)    Unknown reaction   Risedronate     Unknown reaction - Per MAR   Ru-Hist D [Brompheniramine-Phenylephrine] Other (See Comments)    Unknown reaction - Per MAR    Sumatriptan Other (See Comments)   Topiramate Other (See Comments)    Dry eyes   Ace Inhibitors Other (See Comments)    unknown   Aspirin Other (See Comments)    Unknown reaction   Atacand [Candesartan] Other (See Comments)    Unknown reaction - MAR   Bextra [Valdecoxib] Other (See Comments)    Unknown reaction - Per MAR   Bisoprolol-Hydrochlorothiazide Other (See Comments)    Unknown reaction - Per MAR   Cefadroxil Other (See Comments)    Unknown reaction   Celecoxib Rash   Hydrocodone Other (See Comments)    unknown   Iodinated Contrast Media Rash and Other (See Comments)    "All over" rash   Meloxicam Other (See Comments)    unknown   Methylprednisolone Sodium Succinate Other (See Comments)    unknown   Nabumetone Other (See Comments)    Unknown reaction   Penicillins Other (See Comments)    unknown   Pseudoephedrine-Guaifenesin Other (See Comments)    unknown   Rofecoxib Other (See Comments)    Unknown reaction - MAR Other reaction(s): Unknown   Ru-Tuss [Chlorphen-Pse-Atrop-Hyos-Scop] Other (See Comments)    unknown   Sulfonamide Derivatives Other (See Comments)    unknown   Sulphur [Elemental Sulfur] Other (See Comments)    unknown   Telithromycin Other (See Comments)    unknown   Terfenadine Other (See Comments)    Unknown reaction - Per MAR   Trandolapril-Verapamil Hcl Er Other (See Comments)    Headache, difficulty urinating, high blood sugar, fluid retention  Pt is taking Tarka (trandolapril-verapamil) currently, but requests the medication stay in her allergy list   Valium [Diazepam] Other (See Comments)    Makes her mean and hyper    Family History  Problem Relation Age of Onset   Heart disease Mother    Osteoporosis Mother    Diabetes Father    Pancreatic cancer Father    Bone cancer Sister    Rectal cancer Sister    Thyroid disease Sister        benign goiter resected   Prostate cancer Brother    Colon cancer Brother  Colon cancer Other      Prior to Admission medications   Medication Sig Start Date End Date Taking? Authorizing Provider  Acetaminophen 325 MG CAPS Take 650 mg by mouth 3 (three) times daily.   Yes [provider]  apixaban (ELIQUIS) 2.5 MG TABS tablet Take 1 tablet (2.5 mg total) by mouth 2 (two) times daily. 12/14/22  Yes Hill, Alain Honey, PA-C  Cholecalciferol (VITAMIN D-3) 125 MCG (5000 UT) TABS Take 5,000 Units by mouth daily.   Yes [provider]  cholestyramine light (PREVALITE) 4 g packet Take 1 packet (4 g total) by mouth 2 (two) times daily. 11/01/22  Yes Dahal, Melina Schools, MD  diclofenac Sodium (VOLTAREN) 1 % GEL Apply 2 g topically 4 (four) times daily as needed (for knee and back pain). 06/01/19  Yes [provider]  insulin glargine (LANTUS SOLOSTAR) 100 UNIT/ML Solostar Pen Inject 10 Units into the skin at bedtime. Patient taking differently: Inject 13 Units into the skin at bedtime. 08/21/20  Yes Vann, Jessica U, DO  insulin lispro (HUMALOG) 100 UNIT/ML injection Inject 0-12 Units into the skin 3 (three) times daily before meals.   Yes [provider]  levothyroxine (SYNTHROID) 75 MCG tablet Take 1 tablet (75 mcg total) by mouth daily before breakfast. 07/08/20  Yes Fargo, Amy E, NP  Menthol, Topical Analgesic, (BIOFREEZE COOL THE PAIN) 4 % GEL Apply 1 g topically 3 (three) times daily.   Yes [provider]  oxyCODONE (OXY IR/ROXICODONE) 5 MG immediate release tablet Take 2.5 mg by mouth every 6 (six) hours as needed for breakthrough pain. 12/16/22  Yes [provider]  pantoprazole (PROTONIX) 40 MG tablet Take 40 mg by mouth daily. 09/22/21  Yes [provider]  PHENYTEK 300 MG ER capsule TAKE 1 CAPSULE AT BEDTIME 04/30/22  Yes Camara, Amadou, MD  Polyethyl Glycol-Propyl Glycol (SYSTANE) 0.4-0.3 % SOLN Place 2 drops into both eyes 3 (three) times daily.   Yes [provider]  polyethylene glycol (MIRALAX / GLYCOLAX) 17 g packet Take 17 g by  mouth daily as needed. Patient taking differently: Take 17 g by mouth daily as needed for mild constipation. 12/15/22  Yes Marinda Elk, MD  primidone (MYSOLINE) 250 MG tablet Take 1 tablet (250 mg total) by mouth daily. 09/21/22 09/16/23 Yes Windell Norfolk, MD  primidone (MYSOLINE) 50 MG tablet Take 3 tablets (150 mg total) by mouth at bedtime. 09/21/22 09/16/23 Yes Windell Norfolk, MD  thiamine 250 MG tablet Take 250 mg by mouth daily.   Yes [provider]  Verapamil HCl CR 200 MG CP24 Take 200 mg by mouth daily. 10/19/20  Yes [provider]  Blood Glucose Monitoring Suppl (PRODIGY VOICE BLOOD GLUCOSE) w/Device KIT Use to check blood sugar 1 time per day. Patient not taking: Reported on 09/21/2022 10/18/15   Romero Belling, MD  Insulin Pen Needle 32G X 4 MM MISC Used to inject insulin 3x daily Patient taking differently: 1 each by Other route See admin instructions. Used to inject insulin 3x daily 11/19/16   Romero Belling, MD    Physical Exam: Vitals:   12/26/22 1422 12/26/22 1457 12/26/22 1527 12/26/22 1733  BP:  (!) 151/74 (!) 140/72 (!) 149/88  Pulse: 86 85 82 88  Resp: 17 18 15 20   Temp:   98.4 F (36.9 C) 98.3 F (36.8 C)  TempSrc:   Oral Axillary  SpO2: 97% 96% 97% 100%  Weight:      Height:  General: Patient did not appear to be in any distress on initial encounter.  Deferred a lot of history taking to patient's family member at the bedside.  However during exam became tearful when the left leg was attempted to be manipulated. Respiratory exam: Bilateral intravesicular, limited as patient could not be sat up Cardiovascular exam S1-S2 normal Abdomen all quadrants soft nontender. No CVA tenderness elicited with patient recumbent. Extremities have 1+ edema.  In the knee and lower leg.  Patient was able to demonstrate reasonable function of her right lower extremity and bilateral upper extremity, although patient is markedly deconditioned.  But patient flat  out refused/could not move the left lower extremity at all.  Further any attempt to mobilize it was extremely excruciatingly painful.  No skin changes are appreciated there is a dressing of the left inguinal area from the prior surgery still in place. Pelvis is stable to lateral compression Data Reviewed:  Labs on Admission:  Results for orders placed or performed during the hospital encounter of 12/26/22 (from the past 24 hour(s))  CBC with Differential     Status: Abnormal   Collection Time: 12/26/22  1:39 PM  Result Value Ref Range   WBC 11.4 (H) 4.0 - 10.5 K/uL   RBC 3.47 (L) 3.87 - 5.11 MIL/uL   Hemoglobin 10.5 (L) 12.0 - 15.0 g/dL   HCT 16.1 (L) 09.6 - 04.5 %   MCV 90.5 80.0 - 100.0 fL   MCH 30.3 26.0 - 34.0 pg   MCHC 33.4 30.0 - 36.0 g/dL   RDW 40.9 (H) 81.1 - 91.4 %   Platelets 765 (H) 150 - 400 K/uL   nRBC 0.0 0.0 - 0.2 %   Neutrophils Relative % 75 %   Neutro Abs 8.6 (H) 1.7 - 7.7 K/uL   Lymphocytes Relative 15 %   Lymphs Abs 1.7 0.7 - 4.0 K/uL   Monocytes Relative 6 %   Monocytes Absolute 0.6 0.1 - 1.0 K/uL   Eosinophils Relative 2 %   Eosinophils Absolute 0.3 0.0 - 0.5 K/uL   Basophils Relative 1 %   Basophils Absolute 0.1 0.0 - 0.1 K/uL   Immature Granulocytes 1 %   Abs Immature Granulocytes 0.10 (H) 0.00 - 0.07 K/uL  Comprehensive metabolic panel     Status: Abnormal   Collection Time: 12/26/22  1:39 PM  Result Value Ref Range   Sodium 127 (L) 135 - 145 mmol/L   Potassium 3.6 3.5 - 5.1 mmol/L   Chloride 98 98 - 111 mmol/L   CO2 21 (L) 22 - 32 mmol/L   Glucose, Bld 133 (H) 70 - 99 mg/dL   BUN 7 (L) 8 - 23 mg/dL   Creatinine, Ser 7.82 0.44 - 1.00 mg/dL   Calcium 7.7 (L) 8.9 - 10.3 mg/dL   Total Protein 5.1 (L) 6.5 - 8.1 g/dL   Albumin 2.5 (L) 3.5 - 5.0 g/dL   AST 15 15 - 41 U/L   ALT 13 0 - 44 U/L   Alkaline Phosphatase 114 38 - 126 U/L   Total Bilirubin 0.5 0.3 - 1.2 mg/dL   GFR, Estimated >95 >62 mL/min   Anion gap 8 5 - 15  Lipase, blood     Status:  Abnormal   Collection Time: 12/26/22  1:39 PM  Result Value Ref Range   Lipase 55 (H) 11 - 51 U/L  Urinalysis, Routine w reflex microscopic -Urine, Clean Catch     Status: Abnormal   Collection Time: 12/26/22  2:04  PM  Result Value Ref Range   Color, Urine YELLOW YELLOW   APPearance TURBID (A) CLEAR   Specific Gravity, Urine 1.006 1.005 - 1.030   pH 6.0 5.0 - 8.0   Glucose, UA NEGATIVE NEGATIVE mg/dL   Hgb urine dipstick MODERATE (A) NEGATIVE   Bilirubin Urine NEGATIVE NEGATIVE   Ketones, ur NEGATIVE NEGATIVE mg/dL   Protein, ur 409 (A) NEGATIVE mg/dL   Nitrite NEGATIVE NEGATIVE   Leukocytes,Ua MODERATE (A) NEGATIVE   RBC / HPF 21-50 0 - 5 RBC/hpf   WBC, UA >50 0 - 5 WBC/hpf   Bacteria, UA MANY (A) NONE SEEN   Squamous Epithelial / HPF 0-5 0 - 5 /HPF   WBC Clumps PRESENT    *Note: Due to a large number of results and/or encounters for the requested time period, some results have not been displayed. A complete set of results can be found in Results Review.   Basic Metabolic Panel: Recent Labs  Lab 12/26/22 1339  NA 127*  K 3.6  CL 98  CO2 21*  GLUCOSE 133*  BUN 7*  CREATININE 0.49  CALCIUM 7.7*   Liver Function Tests: Recent Labs  Lab 12/26/22 1339  AST 15  ALT 13  ALKPHOS 114  BILITOT 0.5  PROT 5.1*  ALBUMIN 2.5*   Recent Labs  Lab 12/26/22 1339  LIPASE 55*   No results for input(s): "AMMONIA" in the last 168 hours. CBC: Recent Labs  Lab 12/26/22 1339  WBC 11.4*  NEUTROABS 8.6*  HGB 10.5*  HCT 31.4*  MCV 90.5  PLT 765*   Cardiac Enzymes: No results for input(s): "CKTOTAL", "CKMB", "CKMBINDEX", "TROPONINIHS" in the last 168 hours.  BNP (last 3 results) No results for input(s): "PROBNP" in the last 8760 hours. CBG: No results for input(s): "GLUCAP" in the last 168 hours.  Radiological Exams on Admission:  CT Renal Stone Study  Result Date: 12/26/2022 CLINICAL DATA:  Left abdominal and flank pain. EXAM: CT ABDOMEN AND PELVIS WITHOUT CONTRAST  TECHNIQUE: Multidetector CT imaging of the abdomen and pelvis was performed following the standard protocol without IV contrast. RADIATION DOSE REDUCTION: This exam was performed according to the departmental dose-optimization program which includes automated exposure control, adjustment of the mA and/or kV according to patient size and/or use of iterative reconstruction technique. COMPARISON:  01/23/2022 FINDINGS: Lower chest: No acute findings. Hepatobiliary: No mass visualized on this unenhanced exam. Gallbladder is unremarkable. Stable diffuse biliary ductal dilatation, with common bile duct measuring 21 mm. Pancreas: Punctate calcifications are seen mainly in the pancreatic head, consistent with chronic pancreatitis. No evidence of acute pancreatitis. A 2.3 cm low-attenuation lesion in the pancreatic tail remains stable, and Hare be due to a small pseudocyst or indolent cystic neoplasm such as a side-branch IPMN. No No evidence of pancreatic ductal dilatation. Spleen:  Within normal limits in size. Adrenals/Urinary tract: No evidence of urolithiasis or hydronephrosis. Distal ureters and bladder not well visualized due to severe beam hardening artifact from bilateral hip prostheses. Stomach/Bowel: No evidence of obstruction, inflammatory process, or abnormal fluid collections. A Richter hernia is seen in the paraumbilical region containing anti mesenteric wall of the transverse colon. Large amount of stool seen in the rectum. Vascular/Lymphatic: No pathologically enlarged lymph nodes identified. No evidence of abdominal aortic aneurysm. Reproductive: Limited visualization noted due to severe artifact from bilateral hip prostheses. Other:  Diffuse body wall edema. Musculoskeletal: No suspicious bone lesions identified. Old T12 vertebroplasty noted. IMPRESSION: No evidence of urolithiasis, hydronephrosis, or other acute  findings. Stable diffuse biliary ductal dilatation, with common bile duct measuring 21 mm.  Recommend correlation with liver function tests, and consider abdomen MRI without and with contrast for further evaluation. Stable 2.3 cm low-attenuation lesion in pancreatic tail, which Marrocco be due to a small pseudocyst or indolent cystic neoplasm such as a side-branch IPMN. Continued imaging follow-up recommended in 1 year probably abdomen MRI without and with contrast. Chronic pancreatitis. No radiographic evidence of acute pancreatitis. Richter hernia in paraumbilical region containing anti mesenteric wall of transverse colon. No evidence of bowel obstruction. Large amount of stool in rectum. Recommend clinical correlation for possible fecal impaction. Diffuse body wall edema. Electronically Signed   By: Danae Orleans M.D.   On: 12/26/2022 16:06   CT L-SPINE NO CHARGE  Result Date: 12/26/2022 CLINICAL DATA:  Left hip pain.  Recent hip replacement. EXAM: CT LUMBAR SPINE WITHOUT CONTRAST TECHNIQUE: Multidetector CT imaging of the lumbar spine was performed without intravenous contrast administration. Multiplanar CT image reconstructions were also generated. RADIATION DOSE REDUCTION: This exam was performed according to the departmental dose-optimization program which includes automated exposure control, adjustment of the mA and/or kV according to patient size and/or use of iterative reconstruction technique. COMPARISON:  Lumbar spine CT 08/17/2020 FINDINGS: Segmentation: 5 lumbar type vertebrae. Alignment: Mild lumbar levoscoliosis. Unchanged trace anterolisthesis of L2 on L3 and L4 on L5. Vertebrae: Chronic, severe, previously augmented T12 compression fracture, unchanged. No acute fracture or suspicious osseous lesion. The bones appear diffusely osteopenic. Paraspinal and other soft tissues: No acute abnormality identified in the paraspinal soft tissues. Remainder of the abdomen and pelvis reported separately. Disc levels: Unchanged appearance of disc and facet degeneration compared to the prior CT without  high-grade spinal stenosis. 8 mm retropulsion of the T12 vertebral body results in borderline spinal stenosis. Right eccentric disc bulging, endplate spurring, severe disc space height loss, and facet arthrosis at L4-5 result in moderate right neural foraminal stenosis. IMPRESSION: 1. No acute osseous abnormality. 2. Chronic T12 compression fracture. 3. Unchanged lumbar disc and facet degeneration with moderate right neural foraminal stenosis at L4-5. Electronically Signed   By: Sebastian Ache M.D.   On: 12/26/2022 15:49   DG Knee Complete 4 Views Left  Result Date: 12/26/2022 CLINICAL DATA:  Pain. EXAM: LEFT KNEE - COMPLETE 4 VIEW COMPARISON:  None Available. FINDINGS: Osteopenia. Small osteophytes of all 3 compartments. No joint effusion on lateral view. No fracture or dislocation. IMPRESSION: Osteopenia with degenerative changes. Electronically Signed   By: Karen Kays M.D.   On: 12/26/2022 13:55   DG Knee Complete 4 Views Right  Result Date: 12/26/2022 CLINICAL DATA:  Knee pain EXAM: RIGHT KNEE - COMPLETE 4 VIEW COMPARISON:  None Available. FINDINGS: Osteopenia. No fracture or dislocation. Small osteophytes seen of all 3 compartments. No joint effusion. IMPRESSION: Osteopenia.  Slight degenerative change. Electronically Signed   By: Karen Kays M.D.   On: 12/26/2022 13:54   DG Hip Unilat W or Wo Pelvis 2-3 Views Left  Result Date: 12/26/2022 CLINICAL DATA:  Low back pain EXAM: DG HIP (WITH OR WITHOUT PELVIS) 3V LEFT COMPARISON:  12/09/2022.  12/12/2022 FINDINGS: No fracture or dislocation bilateral hip arthroplasty seen with Press-Fit components. The right femoral stem is not included in the imaging field of the left hip exam. There is also a cerclage wire on the left. Overlapping skin staples. No hardware failure. No fracture or dislocation. Vascular calcifications IMPRESSION: Osteopenia. Bilateral hip arthroplasties. Left-side femoral stem has a cerclage wire as well as  overlapping skin staples.  Please correlate with time course of surgery Electronically Signed   By: Karen Kays M.D.   On: 12/26/2022 13:46   DG Lumbar Spine Complete  Result Date: 12/26/2022 CLINICAL DATA:  Pain EXAM: LUMBAR SPINE - COMPLETE 5 VIEW COMPARISON:  None Available. FINDINGS: Diffuse are somewhat limited due to technique and patient's biliary tolerate the exam lateral views are extremely limited. Severe osteopenia. Preserved vertebral body height. Trace anterolisthesis suggested at L4-5 with moderate disc height loss. Prominent lower lumbar facet degenerative changes suggested. No spondylolysis. Augmentation cement seen of the spine at T12. Please correlate with history. Bilateral hip arthroplasties. Overlapping cardiac leads. IMPRESSION: Limited examination. Osteopenia with degenerative changes greatest at L4-5. Trace anterolisthesis at L4-5 Augmentation cement at T12. Electronically Signed   By: Karen Kays M.D.   On: 12/26/2022 13:42       Assessment and Plan: * Pain Please note patient's recent left femoral fracture status post ORIF at [facility], as well as recent discharge.  Although patient is reporting rather diffuse pain from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

## 2022-12-26 NOTE — ED Provider Notes (Signed)
Handoff received from Dr. Ala Dach.  Patient with recent hip replacement presenting for left hip and left abdominal pain.  Urine is grossly purulent concern for UTI.  She has received antibiotics.  Has mild leukocytosis.  Exam she has pain to her left hemiabdomen and left hip.  Suspect some postoperative component as well possible pyelonephritis.  CT scan reassuring against kidney stone.  No signs of hip fracture.  She does have significant constipation which is likely contributing as well.  Discussed with hospitalist and admitted.   Laurence Spates, MD 12/26/22 [id number]

## 2022-12-26 NOTE — ED Notes (Signed)
Patient's depends changed and placed on a purewick.
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
## 2022-12-26 NOTE — ED Notes (Signed)
ED TO INPATIENT HANDOFF REPORT  ED Nurse Name and Phone #:  Jeanmarie Hubert EMT-P  S Name/Age/Gender [name] 89 y.o. female Room/Bed: WA19/WA19  Code Status   Code Status: Full Code  Home/SNF/Other Skilled nursing facility Patient oriented to: self, place, time, and situation Is this baseline? Yes   Triage Complete: Triage complete  Chief Complaint Pyelonephritis [N12]  Triage Note Patient BIB EMS for left hip pain. Patient recently had left hip replacement and is receiving rehab at [facility]. Patient and daughter reports the staff handled her too rough last night and caused the pain. Nurse at [facility] stated she was not crying out until later this AM. They gave her 5 mg of oxy, she refused any other interventions and any further eval from rehab nurse.    Allergies Allergies  Allergen Reactions   Bystolic [Nebivolol Hcl] Other (See Comments)    "extreme weakness, heaviness in legs & arms, swelling in legs/arms/face, swollen abdomen, pain in bladder, feet pain, soreness in chest"   Carbamazepine Other (See Comments)    Blood poisoning   Cholestyramine Itching, Nausea And Vomiting, Rash and Other (See Comments)    "itching rash on stomach, bloated, nausea, vomiting, sleeplessness, extreme pain in arms"  Patient is taking this in 2022.   Morphine Other (See Comments)    Feels morbid, weak, still in pain   Niacin Palpitations    Fast heart beat Other reaction(s): Unknown   Norvasc [Amlodipine Besylate] Other (See Comments)    "extreme fluid retention/pain)   Optivar [Azelastine Hcl] Photosensitivity   Repaglinide Hives   Sular [Nisoldipine Er] Other (See Comments)    "severe headaches, swelling eyes, hands, feet, shortness of breath, weak, flushed face, brain boiling, fluid retention, high blood sugar, nervous, heart fast beating"   Telmisartan Other (See Comments)    "headache, difficulty urinating, high blood sugar, fluid retention"   Amoxicillin-Pot  Clavulanate Rash    Tolerates Unasyn (>10 doses)   Cefdinir Swelling    Vaginal irritation, breathing,    Ciprofloxacin Hcl Hives   Clonidine Other (See Comments)    Dry mouth, fluid retention   Codeine Nausea And Vomiting   Ezetimibe Other (See Comments)    Made weak   Hydroxychloroquine Other (See Comments)    Low platlets   Naproxen Other (See Comments)    Shrinks bladder   Sulfa Antibiotics Rash   Ziac [Bisoprolol-Hydrochlorothiazide] Other (See Comments)    "stopped urination"   Amlodipine Besy-Benazepril Hcl Cough   Azelastine Other (See Comments)    Unknown reaction - Per MAR   Elavil [Amitriptyline] Other (See Comments)    Gave Pt nightmares   Empagliflozin Other (See Comments)    "Caused yeast infection, slowed my urine"   Hydralazine Other (See Comments)    "does not reduce high blood pressure, pain in arm, high pressure, felt like I was on verge of heart attack, really weak"   Iodine I 131 Tositumomab Other (See Comments)    Unknown reaction - Per MAR   Kenalog [Triamcinolone] Diarrhea    Per MAR   Keppra [Levetiracetam] Other (See Comments)    Shaking   Lamotrigine Itching and Other (See Comments)   Pregabalin Swelling and Other (See Comments)    Weight gain   Pseudoephedrine Other (See Comments)    Unknown reaction   Risedronate     Unknown reaction - Per MAR   Ru-Hist D [Brompheniramine-Phenylephrine] Other (See Comments)    Unknown reaction - Per [facility]  Sumatriptan Other (See Comments)   Topiramate Other (See Comments)    Dry eyes   Ace Inhibitors Other (See Comments)    unknown   Aspirin Other (See Comments)    Unknown reaction   Atacand [Candesartan] Other (See Comments)    Unknown reaction - MAR   Bextra [Valdecoxib] Other (See Comments)    Unknown reaction - Per MAR   Bisoprolol-Hydrochlorothiazide Other (See Comments)    Unknown reaction - Per MAR   Cefadroxil Other (See Comments)    Unknown reaction   Celecoxib Rash   Hydrocodone Other (See  Comments)    unknown   Iodinated Contrast Media Rash and Other (See Comments)    "All over" rash   Meloxicam Other (See Comments)    unknown   Methylprednisolone Sodium Succinate Other (See Comments)    unknown   Nabumetone Other (See Comments)    Unknown reaction   Penicillins Other (See Comments)    unknown   Pseudoephedrine-Guaifenesin Other (See Comments)    unknown   Rofecoxib Other (See Comments)    Unknown reaction - MAR Other reaction(s): Unknown   Ru-Tuss [Chlorphen-Pse-Atrop-Hyos-Scop] Other (See Comments)    unknown   Sulfonamide Derivatives Other (See Comments)    unknown   Sulphur [Elemental Sulfur] Other (See Comments)    unknown   Telithromycin Other (See Comments)    unknown   Terfenadine Other (See Comments)    Unknown reaction - Per MAR   Trandolapril-Verapamil Hcl Er Other (See Comments)    Headache, difficulty urinating, high blood sugar, fluid retention  Pt is taking Tarka (trandolapril-verapamil) currently, but requests the medication stay in her allergy list   Valium [Diazepam] Other (See Comments)    Makes her mean and hyper    Level of Care/Admitting Diagnosis ED Disposition     ED Disposition  Admit   Condition  --   Comment  Hospital Area: [facility] [[id number]]  Level of Care: Telemetry [5]  Admit to tele based on following criteria: Acute CHF  [place] admit patient to [facility] or [facility] if equivalent level of care is available:: No  Covid Evaluation: Asymptomatic - no recent exposure (last 10 days) testing not required  Diagnosis: Pyelonephritis [161096]  Admitting Physician: Nolberto Hanlon [[id number]]  Attending Physician: Nolberto Hanlon [[id number]]  Certification:: I certify this patient will need inpatient services for at least 2 midnights  Expected Medical Readiness: 12/29/2022          B Medical/Surgery History Past Medical History:  Diagnosis Date   Anemia    Anxiety    Asthma    Breast cancer (HCC) 2014    right breast   Cancer of right breast (HCC) 12/26/2012   right breast 12:00 o'clock, DCIS   Carotid artery disease (HCC)    Carpal tunnel syndrome, bilateral    Chronic bronchitis (HCC)    Chronic cough    Chronic diarrhea    3 years   Chronic facial pain    Chronic foot pain    Colon cancer (HCC) dx'd 11/2019   Complication of anesthesia    Sore jaw; could not chew or move mouth, prolonged sedation   Convulsions/seizures (HCC) 10/16/2014   Diabetes mellitus    type 2 niddm x 20 years   Dyslipidemia    Ejection fraction    Gait abnormality 12/04/2019   GERD (gastroesophageal reflux disease)    Hammer toe    bilateral   History of colonic polyps    History  of meningioma    HTN (hypertension)    Hx of radiation therapy 03/07/13- 03/29/13   right breast 4250 cGy 17 sessions   Hyperlipidemia    Hypokalemia    Hyponatremia    Hypothyroidism    IBS (irritable bowel syndrome)    Melanoma (HCC)    Metatarsal bone fracture 2014   Multiple drug allergies    Nausea & vomiting 03/12/2020   Nontoxic thyroid nodule    Obesity    Osteoarthritis    Osteoporosis    Palpitations    Personal history of radiation therapy 2014   Seasonal allergies    Skin cancer    Syncope    Tremor, essential 08/18/2016   Vitamin B12 deficiency    Vitamin D deficiency    Past Surgical History:  Procedure Laterality Date   ABDOMINAL HYSTERECTOMY     BIOPSY  11/07/2019   Procedure: BIOPSY;  Surgeon: Bernette Redbird, MD;  Location: [facility];  Service: Endoscopy;;   BRAIN SURGERY     BREAST BIOPSY Right 01/24/2013   Procedure: RE-EXCICION OF BREAST CANCER, ANTERIOR MARGINS;  Surgeon: Mariella Saa, MD;  Location: [facility];  Service: General;  Laterality: Right;   BREAST LUMPECTOMY Right 2014   BREAST LUMPECTOMY WITH NEEDLE LOCALIZATION Right 01/17/2013   Procedure: BREAST LUMPECTOMY WITH NEEDLE LOCALIZATION;  Surgeon: Mariella Saa, MD;  Location: [facility];  Service: General;  Laterality:  Right;   BUNIONECTOMY Bilateral    CATARACT EXTRACTION W/ INTRAOCULAR LENS IMPLANT Right    CHOLECYSTECTOMY     COLONOSCOPY     COLONOSCOPY WITH PROPOFOL N/A 11/07/2019   Procedure: COLONOSCOPY WITH PROPOFOL;  Surgeon: Bernette Redbird, MD;  Location: [facility];  Service: Endoscopy;  Laterality: N/A;   CRANIOTOMY Right 10/18/2013   Procedure: CRANIOTOMY TUMOR EXCISION;  Surgeon: Karn Cassis, MD;  Location: [facility];  Service: Neurosurgery;  Laterality: Right;   ESOPHAGOGASTRODUODENOSCOPY (EGD) WITH PROPOFOL N/A 11/07/2019   Procedure: ESOPHAGOGASTRODUODENOSCOPY (EGD) WITH PROPOFOL;  Surgeon: Bernette Redbird, MD;  Location: [facility];  Service: Endoscopy;  Laterality: N/A;   EYE SURGERY     HERNIA REPAIR     IR THORACENTESIS ASP PLEURAL SPACE W/IMG GUIDE  04/01/2020   KNEE ARTHROSCOPY Bilateral    LAPAROSCOPIC RIGHT HEMI COLECTOMY Right 03/06/2020   Procedure: LAPAROSCOPIC RIGHT HEMI COLECTOMY WITH TAP BLOCK AND LYSIS OF ADHESIONS;  Surgeon: Andria Meuse, MD;  Location: [facility];  Service: General;  Laterality: Right;   POLYPECTOMY     small adenomatous   TOTAL HIP ARTHROPLASTY Right 06/05/2020   Procedure: ARTHROPLASTY  ANTERIOR APPROACH. RIGHT HIP;  Surgeon: Samson Frederic, MD;  Location: [facility];  Service: Orthopedics;  Laterality: Right;   TOTAL HIP ARTHROPLASTY Left 12/12/2022   Procedure: TOTAL HIP ARTHROPLASTY ANTERIOR APPROACH;  Surgeon: Samson Frederic, MD;  Location: [facility];  Service: Orthopedics;  Laterality: Left;   ULNAR TUNNEL RELEASE       A IV Location/Drains/Wounds Patient Lines/Drains/Airways Status     Active Line/Drains/Airways     Name Placement date Placement time Site Days   Peripheral IV 12/26/22 20 G Posterior;Right Hand 12/26/22  1149  Hand  less than 1            Intake/Output Last 24 hours No intake or output data in the 24 hours ending 12/26/22 1658  Labs/Imaging Results for orders placed or performed during the hospital encounter of  12/26/22 (from the past 48 hour(s))  CBC with Differential     Status:  Abnormal   Collection Time: 12/26/22  1:39 PM  Result Value Ref Range   WBC 11.4 (H) 4.0 - 10.5 K/uL   RBC 3.47 (L) 3.87 - 5.11 MIL/uL   Hemoglobin 10.5 (L) 12.0 - 15.0 g/dL   HCT 78.2 (L) 95.6 - 21.3 %   MCV 90.5 80.0 - 100.0 fL   MCH 30.3 26.0 - 34.0 pg   MCHC 33.4 30.0 - 36.0 g/dL   RDW 08.6 (H) 57.8 - 46.9 %   Platelets 765 (H) 150 - 400 K/uL   nRBC 0.0 0.0 - 0.2 %   Neutrophils Relative % 75 %   Neutro Abs 8.6 (H) 1.7 - 7.7 K/uL   Lymphocytes Relative 15 %   Lymphs Abs 1.7 0.7 - 4.0 K/uL   Monocytes Relative 6 %   Monocytes Absolute 0.6 0.1 - 1.0 K/uL   Eosinophils Relative 2 %   Eosinophils Absolute 0.3 0.0 - 0.5 K/uL   Basophils Relative 1 %   Basophils Absolute 0.1 0.0 - 0.1 K/uL   Immature Granulocytes 1 %   Abs Immature Granulocytes 0.10 (H) 0.00 - 0.07 K/uL    Comment: Performed at [facility], [address], [place], [place] [address]  Comprehensive metabolic panel     Status: Abnormal   Collection Time: 12/26/22  1:39 PM  Result Value Ref Range   Sodium 127 (L) 135 - 145 mmol/L   Potassium 3.6 3.5 - 5.1 mmol/L   Chloride 98 98 - 111 mmol/L   CO2 21 (L) 22 - 32 mmol/L   Glucose, Bld 133 (H) 70 - 99 mg/dL    Comment: Glucose reference range applies only to samples taken after fasting for at least 8 hours.   BUN 7 (L) 8 - 23 mg/dL   Creatinine, Ser 8.41 0.44 - 1.00 mg/dL   Calcium 7.7 (L) 8.9 - 10.3 mg/dL   Total Protein 5.1 (L) 6.5 - 8.1 g/dL   Albumin 2.5 (L) 3.5 - 5.0 g/dL   AST 15 15 - 41 U/L   ALT 13 0 - 44 U/L   Alkaline Phosphatase 114 38 - 126 U/L   Total Bilirubin 0.5 0.3 - 1.2 mg/dL   GFR, Estimated >32 >44 mL/min    Comment: (NOTE) Calculated using the CKD-EPI Creatinine Equation (2021)    Anion gap 8 5 - 15    Comment: Performed at [facility], [address], [place], [place] [address]  Lipase, blood     Status: Abnormal    Collection Time: 12/26/22  1:39 PM  Result Value Ref Range   Lipase 55 (H) 11 - 51 U/L    Comment: Performed at [facility], [address], [place], [place] [address]  Urinalysis, Routine w reflex microscopic -Urine, Clean Catch     Status: Abnormal   Collection Time: 12/26/22  2:04 PM  Result Value Ref Range   Color, Urine YELLOW YELLOW   APPearance TURBID (A) CLEAR   Specific Gravity, Urine 1.006 1.005 - 1.030   pH 6.0 5.0 - 8.0   Glucose, UA NEGATIVE NEGATIVE mg/dL   Hgb urine dipstick MODERATE (A) NEGATIVE   Bilirubin Urine NEGATIVE NEGATIVE   Ketones, ur NEGATIVE NEGATIVE mg/dL   Protein, ur 440 (A) NEGATIVE mg/dL   Nitrite NEGATIVE NEGATIVE   Leukocytes,Ua MODERATE (A) NEGATIVE   RBC / HPF 21-50 0 - 5 RBC/hpf   WBC, UA >50 0 - 5 WBC/hpf   Bacteria, UA MANY (A) NONE  SEEN   Squamous Epithelial / HPF 0-5 0 - 5 /HPF   WBC Clumps PRESENT     Comment: Performed at [facility], [address], [place], [place] [address]   *Note: Due to a large number of results and/or encounters for the requested time period, some results have not been displayed. A complete set of results can be found in Results Review.   CT Renal Stone Study  Result Date: 12/26/2022 CLINICAL DATA:  Left abdominal and flank pain. EXAM: CT ABDOMEN AND PELVIS WITHOUT CONTRAST TECHNIQUE: Multidetector CT imaging of the abdomen and pelvis was performed following the standard protocol without IV contrast. RADIATION DOSE REDUCTION: This exam was performed according to the departmental dose-optimization program which includes automated exposure control, adjustment of the mA and/or kV according to patient size and/or use of iterative reconstruction technique. COMPARISON:  01/23/2022 FINDINGS: Lower chest: No acute findings. Hepatobiliary: No mass visualized on this unenhanced exam. Gallbladder is unremarkable. Stable diffuse biliary ductal dilatation, with common bile duct measuring 21 mm.  Pancreas: Punctate calcifications are seen mainly in the pancreatic head, consistent with chronic pancreatitis. No evidence of acute pancreatitis. A 2.3 cm low-attenuation lesion in the pancreatic tail remains stable, and Kindel be due to a small pseudocyst or indolent cystic neoplasm such as a side-branch IPMN. No No evidence of pancreatic ductal dilatation. Spleen:  Within normal limits in size. Adrenals/Urinary tract: No evidence of urolithiasis or hydronephrosis. Distal ureters and bladder not well visualized due to severe beam hardening artifact from bilateral hip prostheses. Stomach/Bowel: No evidence of obstruction, inflammatory process, or abnormal fluid collections. A Richter hernia is seen in the paraumbilical region containing anti mesenteric wall of the transverse colon. Large amount of stool seen in the rectum. Vascular/Lymphatic: No pathologically enlarged lymph nodes identified. No evidence of abdominal aortic aneurysm. Reproductive: Limited visualization noted due to severe artifact from bilateral hip prostheses. Other:  Diffuse body wall edema. Musculoskeletal: No suspicious bone lesions identified. Old T12 vertebroplasty noted. IMPRESSION: No evidence of urolithiasis, hydronephrosis, or other acute findings. Stable diffuse biliary ductal dilatation, with common bile duct measuring 21 mm. Recommend correlation with liver function tests, and consider abdomen MRI without and with contrast for further evaluation. Stable 2.3 cm low-attenuation lesion in pancreatic tail, which Simek be due to a small pseudocyst or indolent cystic neoplasm such as a side-branch IPMN. Continued imaging follow-up recommended in 1 year probably abdomen MRI without and with contrast. Chronic pancreatitis. No radiographic evidence of acute pancreatitis. Richter hernia in paraumbilical region containing anti mesenteric wall of transverse colon. No evidence of bowel obstruction. Large amount of stool in rectum. Recommend clinical  correlation for possible fecal impaction. Diffuse body wall edema. Electronically Signed   By: Danae Orleans M.D.   On: 12/26/2022 16:06   CT L-SPINE NO CHARGE  Result Date: 12/26/2022 CLINICAL DATA:  Left hip pain.  Recent hip replacement. EXAM: CT LUMBAR SPINE WITHOUT CONTRAST TECHNIQUE: Multidetector CT imaging of the lumbar spine was performed without intravenous contrast administration. Multiplanar CT image reconstructions were also generated. RADIATION DOSE REDUCTION: This exam was performed according to the departmental dose-optimization program which includes automated exposure control, adjustment of the mA and/or kV according to patient size and/or use of iterative reconstruction technique. COMPARISON:  Lumbar spine CT 08/17/2020 FINDINGS: Segmentation: 5 lumbar type vertebrae. Alignment: Mild lumbar levoscoliosis. Unchanged trace anterolisthesis of L2 on L3 and L4 on L5. Vertebrae: Chronic, severe, previously augmented T12 compression fracture, unchanged. No acute fracture or suspicious osseous  lesion. The bones appear diffusely osteopenic. Paraspinal and other soft tissues: No acute abnormality identified in the paraspinal soft tissues. Remainder of the abdomen and pelvis reported separately. Disc levels: Unchanged appearance of disc and facet degeneration compared to the prior CT without high-grade spinal stenosis. 8 mm retropulsion of the T12 vertebral body results in borderline spinal stenosis. Right eccentric disc bulging, endplate spurring, severe disc space height loss, and facet arthrosis at L4-5 result in moderate right neural foraminal stenosis. IMPRESSION: 1. No acute osseous abnormality. 2. Chronic T12 compression fracture. 3. Unchanged lumbar disc and facet degeneration with moderate right neural foraminal stenosis at L4-5. Electronically Signed   By: Sebastian Ache M.D.   On: 12/26/2022 15:49   DG Knee Complete 4 Views Left  Result Date: 12/26/2022 CLINICAL DATA:  Pain. EXAM: LEFT KNEE -  COMPLETE 4 VIEW COMPARISON:  None Available. FINDINGS: Osteopenia. Small osteophytes of all 3 compartments. No joint effusion on lateral view. No fracture or dislocation. IMPRESSION: Osteopenia with degenerative changes. Electronically Signed   By: Karen Kays M.D.   On: 12/26/2022 13:55   DG Knee Complete 4 Views Right  Result Date: 12/26/2022 CLINICAL DATA:  Knee pain EXAM: RIGHT KNEE - COMPLETE 4 VIEW COMPARISON:  None Available. FINDINGS: Osteopenia. No fracture or dislocation. Small osteophytes seen of all 3 compartments. No joint effusion. IMPRESSION: Osteopenia.  Slight degenerative change. Electronically Signed   By: Karen Kays M.D.   On: 12/26/2022 13:54   DG Hip Unilat W or Wo Pelvis 2-3 Views Left  Result Date: 12/26/2022 CLINICAL DATA:  Low back pain EXAM: DG HIP (WITH OR WITHOUT PELVIS) 3V LEFT COMPARISON:  12/09/2022.  12/12/2022 FINDINGS: No fracture or dislocation bilateral hip arthroplasty seen with Press-Fit components. The right femoral stem is not included in the imaging field of the left hip exam. There is also a cerclage wire on the left. Overlapping skin staples. No hardware failure. No fracture or dislocation. Vascular calcifications IMPRESSION: Osteopenia. Bilateral hip arthroplasties. Left-side femoral stem has a cerclage wire as well as overlapping skin staples. Please correlate with time course of surgery Electronically Signed   By: Karen Kays M.D.   On: 12/26/2022 13:46   DG Lumbar Spine Complete  Result Date: 12/26/2022 CLINICAL DATA:  Pain EXAM: LUMBAR SPINE - COMPLETE 5 VIEW COMPARISON:  None Available. FINDINGS: Diffuse are somewhat limited due to technique and patient's biliary tolerate the exam lateral views are extremely limited. Severe osteopenia. Preserved vertebral body height. Trace anterolisthesis suggested at L4-5 with moderate disc height loss. Prominent lower lumbar facet degenerative changes suggested. No spondylolysis. Augmentation cement seen of the  spine at T12. Please correlate with history. Bilateral hip arthroplasties. Overlapping cardiac leads. IMPRESSION: Limited examination. Osteopenia with degenerative changes greatest at L4-5. Trace anterolisthesis at L4-5 Augmentation cement at T12. Electronically Signed   By: Karen Kays M.D.   On: 12/26/2022 13:42    Pending Labs Unresulted Labs (From admission, onward)     Start     Ordered   01/02/23 0500  Creatinine, serum  (enoxaparin (LOVENOX)    CrCl >/= 30 ml/min)  Weekly,   R     Comments: while on enoxaparin therapy    12/26/22 1631   12/27/22 0500  APTT  Tomorrow morning,   R        12/26/22 1631   12/27/22 0500  Protime-INR  Tomorrow morning,   R        12/26/22 1631   12/27/22 0500  Basic metabolic  panel  Tomorrow morning,   R        12/26/22 1631   12/27/22 0500  CBC  Tomorrow morning,   R        12/26/22 1631   12/26/22 1631  CBC  (enoxaparin (LOVENOX)    CrCl >/= 30 ml/min)  Once,   R       Comments: Baseline for enoxaparin therapy IF NOT ALREADY DRAWN.  Notify MD if PLT < 100 K.    12/26/22 1631   12/26/22 1631  Creatinine, serum  (enoxaparin (LOVENOX)    CrCl >/= 30 ml/min)  Once,   R       Comments: Baseline for enoxaparin therapy IF NOT ALREADY DRAWN.    12/26/22 1631   12/26/22 1459  Urine Culture  Add-on,   AD       Question:  Indication  Answer:  Dysuria   12/26/22 1458            Vitals/Pain Today's Vitals   12/26/22 1409 12/26/22 1422 12/26/22 1457 12/26/22 1527  BP:   (!) 151/74 (!) 140/72  Pulse: 92 86 85 82  Resp: 15 17 18 15   Temp:    98.4 F (36.9 C)  TempSrc:    Oral  SpO2: (!) 80% 97% 96% 97%  Weight:      Height:      PainSc:        Isolation Precautions No active isolations  Medications Medications  lactated ringers bolus 500 mL (has no administration in time range)  enoxaparin (LOVENOX) injection 40 mg (has no administration in time range)  polyethylene glycol (MIRALAX / GLYCOLAX) packet 17 g (has no administration in time  range)  sodium chloride flush (NS) 0.9 % injection 3 mL (has no administration in time range)  oxyCODONE (OXYCONTIN) 12 hr tablet 10 mg (has no administration in time range)  HYDROmorphone (DILAUDID) injection 0.25 mg (has no administration in time range)  senna-docusate (Senokot-S) tablet 2 tablet (has no administration in time range)  polyethylene glycol (MIRALAX / GLYCOLAX) packet 17 g (has no administration in time range)  acetaminophen (TYLENOL) tablet 650 mg (has no administration in time range)  acetaminophen (TYLENOL) tablet 1,000 mg (1,000 mg Oral Given 12/26/22 1158)  oxyCODONE (Oxy IR/ROXICODONE) immediate release tablet 5 mg (5 mg Oral Given 12/26/22 1158)  HYDROmorphone (DILAUDID) injection 0.5 mg (0.5 mg Intravenous Given 12/26/22 1330)  cefTRIAXone (ROCEPHIN) 2 g in sodium chloride 0.9 % 100 mL IVPB (2 g Intravenous New Bag/Given 12/26/22 1457)    Mobility non-ambulatory     Focused Assessments See Chart   R Recommendations: See Admitting Provider Note  Report given to: Dregely, Marquis Lunch

## 2022-12-26 NOTE — Assessment & Plan Note (Signed)
With associated leukocytosis as well as thrombocytosis, urine culture has been sent from the ER patient already got ceftriaxone, I will continue that for total of 3 days.  Blood cultures would not be helpful at this stage

## 2022-12-26 NOTE — Assessment & Plan Note (Addendum)
Please note patient's recent left femoral fracture status post ORIF at [facility], as well as recent discharge.  Although patient is reporting rather diffuse pain from the left toe all the way to the left shoulder, on physical exam, it seems to be related to musculoskeletal complaint of the left lower extremity.  Therefore I will get a CT scan of this area, and that it was precipitated by physical manipulation last evening at the SNF facility.  We will work up accordingly.  Patient has been ordered for standing as well as as needed pain meds.  Patient is also reporting slow progressive loss of strength of the left lower extremity of the over the last several weeks.  This will have to be worked up further once we have figured out why she is having so much pain.  Cehck troponin and CXR

## 2022-12-26 NOTE — Progress Notes (Addendum)
Please note CAT scan findings from lower extremity CT.  There is an ill-defined fluid collection tracking anteriorly to the mid thigh area which is the patient's area of symptoms.  My concern is that this could be bleeding.  Patient's apixaban will be held.  I will send a type and screen.  Concern for infection is low however I will draw blood cultures and give single dose of antibiotic given an elevated white count. D/w Delila Spence of orthopedics. They will see patient in AM.

## 2022-12-26 NOTE — Assessment & Plan Note (Signed)
Patient has been complaining of constipation for the last 1 week or so.  This is likely induced by opiates.  I will start with MiraLAX and standing senna docusate.  Will proceed accordingly

## 2022-12-26 NOTE — Assessment & Plan Note (Signed)
This is incidentally seen, seems to be previously known, will need outpatient imaging

## 2022-12-26 NOTE — ED Triage Notes (Signed)
Patient BIB EMS for left hip pain. Patient recently had left hip replacement and is receiving rehab at [facility]. Patient and daughter reports the staff handled her too rough last night and caused the pain. Nurse at [facility] stated she was not crying out until later this AM. They gave her 5 mg of oxy, she refused any other interventions and any further eval from rehab nurse.

## 2022-12-26 NOTE — Assessment & Plan Note (Signed)
Is a chronic diagnosis documented in the chart, continue with Dilantin and primidone

## 2022-12-27 ENCOUNTER — Inpatient Hospital Stay (HOSPITAL_COMMUNITY): Payer: Medicare Other

## 2022-12-27 DIAGNOSIS — R52 Pain, unspecified: Secondary | ICD-10-CM

## 2022-12-27 DIAGNOSIS — R609 Edema, unspecified: Secondary | ICD-10-CM | POA: Diagnosis not present

## 2022-12-27 LAB — TYPE AND SCREEN
ABO/RH(D): A POS
Antibody Screen: NEGATIVE

## 2022-12-27 LAB — CBC
HCT: 35.5 % — ABNORMAL LOW (ref 36.0–46.0)
Hemoglobin: 11.2 g/dL — ABNORMAL LOW (ref 12.0–15.0)
MCH: 30.1 pg (ref 26.0–34.0)
MCHC: 31.5 g/dL (ref 30.0–36.0)
MCV: 95.4 fL (ref 80.0–100.0)
Platelets: 588 10*3/uL — ABNORMAL HIGH (ref 150–400)
RBC: 3.72 MIL/uL — ABNORMAL LOW (ref 3.87–5.11)
RDW: 15.9 % — ABNORMAL HIGH (ref 11.5–15.5)
WBC: 9.6 10*3/uL (ref 4.0–10.5)
nRBC: 0 % (ref 0.0–0.2)

## 2022-12-27 LAB — PROTIME-INR
INR: 1.1 (ref 0.8–1.2)
Prothrombin Time: 14.7 s (ref 11.4–15.2)

## 2022-12-27 LAB — BASIC METABOLIC PANEL
Anion gap: 9 (ref 5–15)
Anion gap: 9 (ref 5–15)
BUN: 8 mg/dL (ref 8–23)
BUN: 9 mg/dL (ref 8–23)
CO2: 22 mmol/L (ref 22–32)
CO2: 21 mmol/L — ABNORMAL LOW (ref 22–32)
Calcium: 8 mg/dL — ABNORMAL LOW (ref 8.9–10.3)
Calcium: 7.7 mg/dL — ABNORMAL LOW (ref 8.9–10.3)
Chloride: 97 mmol/L — ABNORMAL LOW (ref 98–111)
Chloride: 96 mmol/L — ABNORMAL LOW (ref 98–111)
Creatinine, Ser: 0.48 mg/dL (ref 0.44–1.00)
Creatinine, Ser: 0.54 mg/dL (ref 0.44–1.00)
GFR, Estimated: >60 mL/min (ref >60)
GFR, Estimated: >60 mL/min (ref >60)
Glucose, Bld: 71 mg/dL (ref 70–99)
Glucose, Bld: 148 mg/dL — ABNORMAL HIGH (ref 70–99)
Potassium: 3.8 mmol/L (ref 3.5–5.1)
Potassium: 4.1 mmol/L (ref 3.5–5.1)
Sodium: 128 mmol/L — ABNORMAL LOW (ref 135–145)
Sodium: 126 mmol/L — ABNORMAL LOW (ref 135–145)

## 2022-12-27 LAB — GLUCOSE, CAPILLARY
Glucose-Capillary: 75 mg/dL (ref 70–99)
Glucose-Capillary: 161 mg/dL — ABNORMAL HIGH (ref 70–99)
Glucose-Capillary: 214 mg/dL — ABNORMAL HIGH (ref 70–99)
Glucose-Capillary: 268 mg/dL — ABNORMAL HIGH (ref 70–99)

## 2022-12-27 LAB — APTT: aPTT: 36 s (ref 24–36)

## 2022-12-27 LAB — OSMOLALITY, URINE: Osmolality, Ur: 303 mOsm/kg (ref 300–900)

## 2022-12-27 MED ORDER — BISACODYL 10 MG RE SUPP
10.0000 mg | Freq: Every day | RECTAL | Status: DC | PRN
  Administered 2022-12-27: 10 mg via RECTAL
  Filled 2022-12-27: qty 1

## 2022-12-27 MED ORDER — POLYETHYLENE GLYCOL 3350 17 G PO PACK
17.0000 g | PACK | Freq: Two times a day (BID) | ORAL | Status: DC
  Administered 2022-12-27 – 2022-12-30 (×4): 17 g via ORAL
  Filled 2022-12-27 (×6): qty 1

## 2022-12-27 MED ORDER — ACETAMINOPHEN 500 MG PO TABS
500.0000 mg | ORAL_TABLET | Freq: Three times a day (TID) | ORAL | Status: DC
  Administered 2022-12-27 – 2023-01-03 (×22): 500 mg via ORAL
  Filled 2022-12-27 (×22): qty 1

## 2022-12-27 MED ORDER — SODIUM CHLORIDE 0.9 % IV SOLN
2.0000 g | INTRAVENOUS | Status: AC
  Administered 2022-12-27 – 2022-12-30 (×4): 2 g via INTRAVENOUS
  Filled 2022-12-27 (×4): qty 20

## 2022-12-27 MED ORDER — SODIUM CHLORIDE 1 G PO TABS
2.0000 g | ORAL_TABLET | Freq: Two times a day (BID) | ORAL | Status: DC
  Administered 2022-12-27 – 2023-01-03 (×15): 2 g via ORAL
  Filled 2022-12-27 (×15): qty 2

## 2022-12-27 MED ORDER — CYCLOBENZAPRINE HCL 5 MG PO TABS
5.0000 mg | ORAL_TABLET | Freq: Three times a day (TID) | ORAL | Status: DC | PRN
  Administered 2022-12-30 – 2023-01-03 (×6): 5 mg via ORAL
  Filled 2022-12-27 (×6): qty 1

## 2022-12-27 MED ORDER — ENSURE ENLIVE PO LIQD
237.0000 mL | Freq: Two times a day (BID) | ORAL | Status: DC
  Administered 2022-12-27 – 2023-01-03 (×12): 237 mL via ORAL

## 2022-12-27 MED ORDER — FUROSEMIDE 10 MG/ML IJ SOLN
40.0000 mg | Freq: Once | INTRAMUSCULAR | Status: AC
  Administered 2022-12-27: 40 mg via INTRAVENOUS
  Filled 2022-12-27: qty 4

## 2022-12-27 NOTE — Progress Notes (Addendum)
PROGRESS NOTE    [name]  QQV:[id number] DOB: 02-11-1930 DOA: 12/26/2022 PCP: Verlon Au, MD   Brief Narrative: 89 year old with past medical history significant for close left hip fracture status post ORIF 12/2022, patient has not been able to bearing weight since her surgery and has had progressive loss of function of the right lower extremity.  Patient was being turned when she apparently had her left leg positioned in an awkward manner causing severe pain.  She was also found to have UTI.   Assessment & Plan:   Principal Problem:   Pain Active Problems:   Seizure disorder (HCC)   Hyponatremia   Constipation   Pyelonephritis   UTI (urinary tract infection)   Pancreatic mass   Hernia of abdominal cavity   1-Left leg pain Recent femoral fracture status post ORIF 12/12/2022, by Dr Linna Caprice.  CT left Femur: Prior left hip replacement.  No hardware complicating feature. No acute bony abnormality. Irregular ill-defined fluid collection and gas seen anterior to the left hip replacement within the quadriceps muscle extending inferiorly into the mid thigh region. This could reflect postoperative hematoma although infection/developing abscess cannot be excluded. Hemoglobin stable at 10.  -she has been declining eliquis at her facility  -Still having hip knee pain.  Schedule tylenol.. continue with PRN oxycodone. Add flexeril.  Ortho will see patient.   Urinary tract infection: Patient with leukocytosis, pyuria Follow urine culture Follow blood culture.  Continue with ceftriaxone  Left Knee pain, edema; Ortho to follow up on patient today   Hernia of abdominal cavity: Seen on CT scan, outpatient follow-up -  Anasarca; LE edema;  Doppler negative for DVT  IV lasix.  Start ensure, improve nutrition.   Pancreatic mass: Stable diffuse biliary ductal dilatation, with common bile duct measuring 21 mm. Known previously, need outpatient follow-up  Hyponatremia:  chronic, prior sodium 125---129 She was on sodium tablet last admission.  Resume sodium tablet.  Will give IV lasix, hypervolemia.  Monitor..   Constipation, fecal impaction: Had BM She will need more BM.Marland Kitchen  Continue with miralax, senna,  Dulcolax suppository   Seizure disorder: Continue with phenytoin.   DVT prophylaxis holding eliquis due to hip hematoma, she has been refusing eliquis at rehab,    Estimated body mass index is 22.44 kg/m as calculated from the following:   Height as of this encounter: 5\' 7"  (1.702 m).   Weight as of this encounter: 65 kg.   DVT prophylaxis: SCD Code Status: DNR Family Communication: sister at bedside Disposition Plan:  Status is: Inpatient Remains inpatient appropriate because: management of pain, UTI    Consultants:  Ortho  Procedures:  Doppler  Antimicrobials:    Subjective: She is alert, complaint of left knee pain and swelling.  Had BM yesterday. Still having leg pain.    Objective: Vitals:   12/26/22 1805 12/26/22 2030 12/27/22 0127 12/27/22 0439  BP: (!) 140/74 (!) 154/86 136/73 (!) 157/75  Pulse: 93 89 83 72  Resp: 17 18 18 18   Temp: 97.8 F (36.6 C) 97.6 F (36.4 C) 97.6 F (36.4 C) 97.7 F (36.5 C)  TempSrc: Axillary Oral Oral Axillary  SpO2: 90% 100% 100% 100%  Weight:      Height:        Intake/Output Summary (Last 24 hours) at 12/27/2022 0746 Last data filed at 12/27/2022 0655 Gross per 24 hour  Intake --  Output 200 ml  Net -200 ml   Filed Weights   12/26/22 1141  Weight: 65 kg    Examination:  General exam: Appears calm and comfortable  Respiratory system: Clear to auscultation. Respiratory effort normal. Cardiovascular system: S1 & S2 heard, RRR. No JVD, murmurs, rubs, gallops or clicks. No pedal edema. Gastrointestinal system: Abdomen is nondistended, soft and nontender.  Central nervous system: Alert and oriented. No focal neurological deficits. Extremities: Plus 2 edema  Data  Reviewed: I have personally reviewed following labs and imaging studies  CBC: Recent Labs  Lab 12/26/22 1339 12/26/22 1923  WBC 11.4* 12.6*  NEUTROABS 8.6*  --   HGB 10.5* 10.0*  HCT 31.4* 30.7*  MCV 90.5 93.0  PLT 765* 571*   Basic Metabolic Panel: Recent Labs  Lab 12/26/22 1339 12/26/22 1923  NA 127*  --   K 3.6  --   CL 98  --   CO2 21*  --   GLUCOSE 133*  --   BUN 7*  --   CREATININE 0.49 0.53  CALCIUM 7.7*  --    GFR: Estimated Creatinine Clearance: 45.5 mL/min (by C-G formula based on SCr of 0.53 mg/dL). Liver Function Tests: Recent Labs  Lab 12/26/22 1339  AST 15  ALT 13  ALKPHOS 114  BILITOT 0.5  PROT 5.1*  ALBUMIN 2.5*   Recent Labs  Lab 12/26/22 1339  LIPASE 55*   No results for input(s): "AMMONIA" in the last 168 hours. Coagulation Profile: No results for input(s): "INR", "PROTIME" in the last 168 hours. Cardiac Enzymes: No results for input(s): "CKTOTAL", "CKMB", "CKMBINDEX", "TROPONINI" in the last 168 hours. BNP (last 3 results) No results for input(s): "PROBNP" in the last 8760 hours. HbA1C: No results for input(s): "HGBA1C" in the last 72 hours. CBG: Recent Labs  Lab 12/26/22 2300  GLUCAP 149*   Lipid Profile: No results for input(s): "CHOL", "HDL", "LDLCALC", "TRIG", "CHOLHDL", "LDLDIRECT" in the last 72 hours. Thyroid Function Tests: No results for input(s): "TSH", "T4TOTAL", "FREET4", "T3FREE", "THYROIDAB" in the last 72 hours. Anemia Panel: No results for input(s): "VITAMINB12", "FOLATE", "FERRITIN", "TIBC", "IRON", "RETICCTPCT" in the last 72 hours. Sepsis Labs: No results for input(s): "PROCALCITON", "LATICACIDVEN" in the last 168 hours.  No results found for this or any previous visit (from the past 240 hour(s)).       Radiology Studies: DG CHEST PORT 1 VIEW  Result Date: 12/26/2022 CLINICAL DATA:  Recent surgery, patient states unable to bear weight since the surgery. Progressive loss of function of the left lower  extremity. Recent left hip replacement. Urinary tract infection. EXAM: PORTABLE CHEST 1 VIEW COMPARISON:  12/09/2022 FINDINGS: The patient is rotated to the right on today's radiograph, reducing diagnostic sensitivity and specificity. The lungs appear clear. Cardiac and mediastinal contours normal. No blunting of the costophrenic angles. No discrete bony abnormality identified. IMPRESSION: 1. No acute findings. Electronically Signed   By: Gaylyn Rong M.D.   On: 12/26/2022 19:01   CT FEMUR LEFT WO CONTRAST  Result Date: 12/26/2022 CLINICAL DATA:  Left leg pain.  Stress fracture suspected. EXAM: CT OF THE LOWER LEFT EXTREMITY WITHOUT CONTRAST TECHNIQUE: Multidetector CT imaging of the lower left extremity from the pelvis through the foot was performed according to the standard protocol. RADIATION DOSE REDUCTION: This exam was performed according to the departmental dose-optimization program which includes automated exposure control, adjustment of the mA and/or kV according to patient size and/or use of iterative reconstruction technique. COMPARISON:  None Available. FINDINGS: Bones/Joint/Cartilage Prior left hip replacement. No hardware complicating feature. No acute bony abnormality. Specifically, no fracture,  subluxation, or dislocation. Ligaments Suboptimally assessed by CT. Muscles and Tendons There is gas noted around the left proximal femur and hip anteriorly within the quadriceps muscle. This tracks down in the quadriceps muscle into the mid thigh. There is also likely irregular ill-defined fluid collection anterior to left hip replacement. This is difficult to measure due to its ill-defined appearance. This could reflect postoperative hematoma, but given the gas present, cannot exclude infection/developing abscess. Soft tissues As above IMPRESSION: Prior left hip replacement.  No hardware complicating feature. No acute bony abnormality. Irregular ill-defined fluid collection and gas seen anterior to  the left hip replacement within the quadriceps muscle extending inferiorly into the mid thigh region. This could reflect postoperative hematoma although infection/developing abscess cannot be excluded. Electronically Signed   By: Charlett Nose M.D.   On: 12/26/2022 18:29   CT EXTREMITY LOWER LEFT WO CONTRAST  Result Date: 12/26/2022 CLINICAL DATA:  Left leg pain.  Stress fracture suspected. EXAM: CT OF THE LOWER LEFT EXTREMITY WITHOUT CONTRAST TECHNIQUE: Multidetector CT imaging of the lower left extremity from the pelvis through the foot was performed according to the standard protocol. RADIATION DOSE REDUCTION: This exam was performed according to the departmental dose-optimization program which includes automated exposure control, adjustment of the mA and/or kV according to patient size and/or use of iterative reconstruction technique. COMPARISON:  None Available. FINDINGS: Bones/Joint/Cartilage Prior left hip replacement. No hardware complicating feature. No acute bony abnormality. Specifically, no fracture, subluxation, or dislocation. Ligaments Suboptimally assessed by CT. Muscles and Tendons There is gas noted around the left proximal femur and hip anteriorly within the quadriceps muscle. This tracks down in the quadriceps muscle into the mid thigh. There is also likely irregular ill-defined fluid collection anterior to left hip replacement. This is difficult to measure due to its ill-defined appearance. This could reflect postoperative hematoma, but given the gas present, cannot exclude infection/developing abscess. Soft tissues As above IMPRESSION: Prior left hip replacement.  No hardware complicating feature. No acute bony abnormality. Irregular ill-defined fluid collection and gas seen anterior to the left hip replacement within the quadriceps muscle extending inferiorly into the mid thigh region. This could reflect postoperative hematoma although infection/developing abscess cannot be excluded.  Electronically Signed   By: Charlett Nose M.D.   On: 12/26/2022 18:29   CT FOOT LEFT WO CONTRAST  Result Date: 12/26/2022 CLINICAL DATA:  Left leg pain.  Stress fracture suspected. EXAM: CT OF THE LOWER LEFT EXTREMITY WITHOUT CONTRAST TECHNIQUE: Multidetector CT imaging of the lower left extremity from the pelvis through the foot was performed according to the standard protocol. RADIATION DOSE REDUCTION: This exam was performed according to the departmental dose-optimization program which includes automated exposure control, adjustment of the mA and/or kV according to patient size and/or use of iterative reconstruction technique. COMPARISON:  None Available. FINDINGS: Bones/Joint/Cartilage Prior left hip replacement. No hardware complicating feature. No acute bony abnormality. Specifically, no fracture, subluxation, or dislocation. Ligaments Suboptimally assessed by CT. Muscles and Tendons There is gas noted around the left proximal femur and hip anteriorly within the quadriceps muscle. This tracks down in the quadriceps muscle into the mid thigh. There is also likely irregular ill-defined fluid collection anterior to left hip replacement. This is difficult to measure due to its ill-defined appearance. This could reflect postoperative hematoma, but given the gas present, cannot exclude infection/developing abscess. Soft tissues As above IMPRESSION: Prior left hip replacement.  No hardware complicating feature. No acute bony abnormality. Irregular ill-defined fluid collection and gas  seen anterior to the left hip replacement within the quadriceps muscle extending inferiorly into the mid thigh region. This could reflect postoperative hematoma although infection/developing abscess cannot be excluded. Electronically Signed   By: Charlett Nose M.D.   On: 12/26/2022 18:29   CT Renal Stone Study  Result Date: 12/26/2022 CLINICAL DATA:  Left abdominal and flank pain. EXAM: CT ABDOMEN AND PELVIS WITHOUT CONTRAST  TECHNIQUE: Multidetector CT imaging of the abdomen and pelvis was performed following the standard protocol without IV contrast. RADIATION DOSE REDUCTION: This exam was performed according to the departmental dose-optimization program which includes automated exposure control, adjustment of the mA and/or kV according to patient size and/or use of iterative reconstruction technique. COMPARISON:  01/23/2022 FINDINGS: Lower chest: No acute findings. Hepatobiliary: No mass visualized on this unenhanced exam. Gallbladder is unremarkable. Stable diffuse biliary ductal dilatation, with common bile duct measuring 21 mm. Pancreas: Punctate calcifications are seen mainly in the pancreatic head, consistent with chronic pancreatitis. No evidence of acute pancreatitis. A 2.3 cm low-attenuation lesion in the pancreatic tail remains stable, and Peyton be due to a small pseudocyst or indolent cystic neoplasm such as a side-branch IPMN. No No evidence of pancreatic ductal dilatation. Spleen:  Within normal limits in size. Adrenals/Urinary tract: No evidence of urolithiasis or hydronephrosis. Distal ureters and bladder not well visualized due to severe beam hardening artifact from bilateral hip prostheses. Stomach/Bowel: No evidence of obstruction, inflammatory process, or abnormal fluid collections. A Richter hernia is seen in the paraumbilical region containing anti mesenteric wall of the transverse colon. Large amount of stool seen in the rectum. Vascular/Lymphatic: No pathologically enlarged lymph nodes identified. No evidence of abdominal aortic aneurysm. Reproductive: Limited visualization noted due to severe artifact from bilateral hip prostheses. Other:  Diffuse body wall edema. Musculoskeletal: No suspicious bone lesions identified. Old T12 vertebroplasty noted. IMPRESSION: No evidence of urolithiasis, hydronephrosis, or other acute findings. Stable diffuse biliary ductal dilatation, with common bile duct measuring 21 mm.  Recommend correlation with liver function tests, and consider abdomen MRI without and with contrast for further evaluation. Stable 2.3 cm low-attenuation lesion in pancreatic tail, which Vandunk be due to a small pseudocyst or indolent cystic neoplasm such as a side-branch IPMN. Continued imaging follow-up recommended in 1 year probably abdomen MRI without and with contrast. Chronic pancreatitis. No radiographic evidence of acute pancreatitis. Richter hernia in paraumbilical region containing anti mesenteric wall of transverse colon. No evidence of bowel obstruction. Large amount of stool in rectum. Recommend clinical correlation for possible fecal impaction. Diffuse body wall edema. Electronically Signed   By: Danae Orleans M.D.   On: 12/26/2022 16:06   CT L-SPINE NO CHARGE  Result Date: 12/26/2022 CLINICAL DATA:  Left hip pain.  Recent hip replacement. EXAM: CT LUMBAR SPINE WITHOUT CONTRAST TECHNIQUE: Multidetector CT imaging of the lumbar spine was performed without intravenous contrast administration. Multiplanar CT image reconstructions were also generated. RADIATION DOSE REDUCTION: This exam was performed according to the departmental dose-optimization program which includes automated exposure control, adjustment of the mA and/or kV according to patient size and/or use of iterative reconstruction technique. COMPARISON:  Lumbar spine CT 08/17/2020 FINDINGS: Segmentation: 5 lumbar type vertebrae. Alignment: Mild lumbar levoscoliosis. Unchanged trace anterolisthesis of L2 on L3 and L4 on L5. Vertebrae: Chronic, severe, previously augmented T12 compression fracture, unchanged. No acute fracture or suspicious osseous lesion. The bones appear diffusely osteopenic. Paraspinal and other soft tissues: No acute abnormality identified in the paraspinal soft tissues. Remainder of the abdomen and pelvis  reported separately. Disc levels: Unchanged appearance of disc and facet degeneration compared to the prior CT without  high-grade spinal stenosis. 8 mm retropulsion of the T12 vertebral body results in borderline spinal stenosis. Right eccentric disc bulging, endplate spurring, severe disc space height loss, and facet arthrosis at L4-5 result in moderate right neural foraminal stenosis. IMPRESSION: 1. No acute osseous abnormality. 2. Chronic T12 compression fracture. 3. Unchanged lumbar disc and facet degeneration with moderate right neural foraminal stenosis at L4-5. Electronically Signed   By: Sebastian Ache M.D.   On: 12/26/2022 15:49   DG Knee Complete 4 Views Left  Result Date: 12/26/2022 CLINICAL DATA:  Pain. EXAM: LEFT KNEE - COMPLETE 4 VIEW COMPARISON:  None Available. FINDINGS: Osteopenia. Small osteophytes of all 3 compartments. No joint effusion on lateral view. No fracture or dislocation. IMPRESSION: Osteopenia with degenerative changes. Electronically Signed   By: Karen Kays M.D.   On: 12/26/2022 13:55   DG Knee Complete 4 Views Right  Result Date: 12/26/2022 CLINICAL DATA:  Knee pain EXAM: RIGHT KNEE - COMPLETE 4 VIEW COMPARISON:  None Available. FINDINGS: Osteopenia. No fracture or dislocation. Small osteophytes seen of all 3 compartments. No joint effusion. IMPRESSION: Osteopenia.  Slight degenerative change. Electronically Signed   By: Karen Kays M.D.   On: 12/26/2022 13:54   DG Hip Unilat W or Wo Pelvis 2-3 Views Left  Result Date: 12/26/2022 CLINICAL DATA:  Low back pain EXAM: DG HIP (WITH OR WITHOUT PELVIS) 3V LEFT COMPARISON:  12/09/2022.  12/12/2022 FINDINGS: No fracture or dislocation bilateral hip arthroplasty seen with Press-Fit components. The right femoral stem is not included in the imaging field of the left hip exam. There is also a cerclage wire on the left. Overlapping skin staples. No hardware failure. No fracture or dislocation. Vascular calcifications IMPRESSION: Osteopenia. Bilateral hip arthroplasties. Left-side femoral stem has a cerclage wire as well as overlapping skin staples.  Please correlate with time course of surgery Electronically Signed   By: Karen Kays M.D.   On: 12/26/2022 13:46   DG Lumbar Spine Complete  Result Date: 12/26/2022 CLINICAL DATA:  Pain EXAM: LUMBAR SPINE - COMPLETE 5 VIEW COMPARISON:  None Available. FINDINGS: Diffuse are somewhat limited due to technique and patient's biliary tolerate the exam lateral views are extremely limited. Severe osteopenia. Preserved vertebral body height. Trace anterolisthesis suggested at L4-5 with moderate disc height loss. Prominent lower lumbar facet degenerative changes suggested. No spondylolysis. Augmentation cement seen of the spine at T12. Please correlate with history. Bilateral hip arthroplasties. Overlapping cardiac leads. IMPRESSION: Limited examination. Osteopenia with degenerative changes greatest at L4-5. Trace anterolisthesis at L4-5 Augmentation cement at T12. Electronically Signed   By: Karen Kays M.D.   On: 12/26/2022 13:42        Scheduled Meds:  acetaminophen  650 mg Oral Q4H while awake   cholecalciferol  5,000 Units Oral Daily   cholestyramine light  4 g Oral BID   insulin aspart  0-15 Units Subcutaneous TID WC   insulin aspart  0-5 Units Subcutaneous QHS   insulin glargine-yfgn  13 Units Subcutaneous QHS   levothyroxine  75 mcg Oral QAC breakfast   oxyCODONE  10 mg Oral Q12H   pantoprazole  40 mg Oral Daily   phenytoin  300 mg Oral QHS   polyethylene glycol  17 g Oral Daily   polyvinyl alcohol  2 drop Both Eyes TID   primidone  150 mg Oral QHS   primidone  250 mg Oral Daily  senna-docusate  2 tablet Oral BID   sodium chloride flush  3 mL Intravenous Q12H   thiamine  250 mg Oral Daily   verapamil  180 mg Oral Daily   Continuous Infusions:  cefTRIAXone (ROCEPHIN)  IV     lactated ringers       LOS: 1 day    Time spent: 35 minutes    Maykayla Highley A Tempestt Silba, MD [facility]   If 7PM-7AM, please contact night-coverage [email or web address]  12/27/2022, 7:46 AM

## 2022-12-27 NOTE — Progress Notes (Signed)
Bilateral lower extremity venous duplex has been completed. Preliminary results can be found in CV Proc through chart review.   12/27/22 11:07 AM Olen Cordial RVT

## 2022-12-27 NOTE — Progress Notes (Signed)
Perform in and out cath and total output was 700 ml.

## 2022-12-27 NOTE — Plan of Care (Signed)
  Problem: Pain Management: Goal: Pain level will decrease with appropriate interventions Outcome: Progressing   Problem: Skin Integrity: Goal: Will show signs of wound healing Outcome: Progressing   Problem: Health Behavior/Discharge Planning: Goal: Ability to manage health-related needs will improve Outcome: Progressing   Problem: Clinical Measurements: Goal: Will remain free from infection Outcome: Progressing   Problem: Nutrition: Goal: Adequate nutrition will be maintained Outcome: Progressing

## 2022-12-27 NOTE — Consult Note (Signed)
Reason for Consult: left hip and knee pain Referring Physician: Sunnie Nielsen, MD  [name] is an 89 y.o. female.  HPI: [name] is a 89 y.o. female with medical history significant of closed left hip fracture on or about December 09, 2022.  S/p ORIF.  Further history is obtained from patient's niece and sister at the bedside as well as corroborated by the patient.  Patient reports that she has unable to be bearing weight since her surgery and has had a progressive loss of function of the lower EXTR left lower extremity.  Patient has been cared for at a SNF/rehab facility since then.  Patient was being turned yesterday when she apparently had her left lower extremity positioned in an awkward manner causing severe pain.  Patient is reporting pain from the left toe all the way to the left shoulder.    She feels that the way she was being moved in the bed was overly aggressive and resulted in her pain  Past Medical History:  Diagnosis Date   Anemia    Anxiety    Asthma    Breast cancer (HCC) 2014   right breast   Cancer of right breast (HCC) 12/26/2012   right breast 12:00 o'clock, DCIS   Carotid artery disease (HCC)    Carpal tunnel syndrome, bilateral    Chronic bronchitis (HCC)    Chronic cough    Chronic diarrhea    3 years   Chronic facial pain    Chronic foot pain    Colon cancer (HCC) dx'd 11/2019   Complication of anesthesia    Sore jaw; could not chew or move mouth, prolonged sedation   Convulsions/seizures (HCC) 10/16/2014   Diabetes mellitus    type 2 niddm x 20 years   Dyslipidemia    Ejection fraction    Gait abnormality 12/04/2019   GERD (gastroesophageal reflux disease)    Hammer toe    bilateral   History of colonic polyps    History of meningioma    HTN (hypertension)    Hx of radiation therapy 03/07/13- 03/29/13   right breast 4250 cGy 17 sessions   Hyperlipidemia    Hypokalemia    Hyponatremia    Hypothyroidism    IBS (irritable bowel syndrome)    Melanoma (HCC)     Metatarsal bone fracture 2014   Multiple drug allergies    Nausea & vomiting 03/12/2020   Nontoxic thyroid nodule    Obesity    Osteoarthritis    Osteoporosis    Palpitations    Personal history of radiation therapy 2014   Seasonal allergies    Skin cancer    Syncope    Tremor, essential 08/18/2016   Vitamin B12 deficiency    Vitamin D deficiency     Past Surgical History:  Procedure Laterality Date   ABDOMINAL HYSTERECTOMY     BIOPSY  11/07/2019   Procedure: BIOPSY;  Surgeon: Bernette Redbird, MD;  Location: [facility];  Service: Endoscopy;;   BRAIN SURGERY     BREAST BIOPSY Right 01/24/2013   Procedure: RE-EXCICION OF BREAST CANCER, ANTERIOR MARGINS;  Surgeon: Mariella Saa, MD;  Location: [facility];  Service: General;  Laterality: Right;   BREAST LUMPECTOMY Right 2014   BREAST LUMPECTOMY WITH NEEDLE LOCALIZATION Right 01/17/2013   Procedure: BREAST LUMPECTOMY WITH NEEDLE LOCALIZATION;  Surgeon: Mariella Saa, MD;  Location: [facility];  Service: General;  Laterality: Right;   BUNIONECTOMY Bilateral    CATARACT EXTRACTION W/ INTRAOCULAR LENS  IMPLANT Right    CHOLECYSTECTOMY     COLONOSCOPY     COLONOSCOPY WITH PROPOFOL N/A 11/07/2019   Procedure: COLONOSCOPY WITH PROPOFOL;  Surgeon: Bernette Redbird, MD;  Location: [facility];  Service: Endoscopy;  Laterality: N/A;   CRANIOTOMY Right 10/18/2013   Procedure: CRANIOTOMY TUMOR EXCISION;  Surgeon: Karn Cassis, MD;  Location: [facility];  Service: Neurosurgery;  Laterality: Right;   ESOPHAGOGASTRODUODENOSCOPY (EGD) WITH PROPOFOL N/A 11/07/2019   Procedure: ESOPHAGOGASTRODUODENOSCOPY (EGD) WITH PROPOFOL;  Surgeon: Bernette Redbird, MD;  Location: [facility];  Service: Endoscopy;  Laterality: N/A;   EYE SURGERY     HERNIA REPAIR     IR THORACENTESIS ASP PLEURAL SPACE W/IMG GUIDE  04/01/2020   KNEE ARTHROSCOPY Bilateral    LAPAROSCOPIC RIGHT HEMI COLECTOMY Right 03/06/2020   Procedure: LAPAROSCOPIC RIGHT HEMI COLECTOMY  WITH TAP BLOCK AND LYSIS OF ADHESIONS;  Surgeon: Andria Meuse, MD;  Location: [facility];  Service: General;  Laterality: Right;   POLYPECTOMY     small adenomatous   TOTAL HIP ARTHROPLASTY Right 06/05/2020   Procedure: ARTHROPLASTY  ANTERIOR APPROACH. RIGHT HIP;  Surgeon: Samson Frederic, MD;  Location: [facility];  Service: Orthopedics;  Laterality: Right;   TOTAL HIP ARTHROPLASTY Left 12/12/2022   Procedure: TOTAL HIP ARTHROPLASTY ANTERIOR APPROACH;  Surgeon: Samson Frederic, MD;  Location: [facility];  Service: Orthopedics;  Laterality: Left;   ULNAR TUNNEL RELEASE      Family History  Problem Relation Age of Onset   Heart disease Mother    Osteoporosis Mother    Diabetes Father    Pancreatic cancer Father    Bone cancer Sister    Rectal cancer Sister    Thyroid disease Sister        benign goiter resected   Prostate cancer Brother    Colon cancer Brother    Colon cancer Other     Social History:  reports that she has never smoked. She has never used smokeless tobacco. She reports that she does not drink alcohol and does not use drugs.  Allergies:  Allergies  Allergen Reactions   Bystolic [Nebivolol Hcl] Other (See Comments)    "extreme weakness, heaviness in legs & arms, swelling in legs/arms/face, swollen abdomen, pain in bladder, feet pain, soreness in chest"   Carbamazepine Other (See Comments)    Blood poisoning   Cholestyramine Itching, Nausea And Vomiting, Rash and Other (See Comments)    "itching rash on stomach, bloated, nausea, vomiting, sleeplessness, extreme pain in arms"  Patient is taking this in 2022.   Morphine Other (See Comments)    Feels morbid, weak, still in pain   Niacin Palpitations    Fast heart beat Other reaction(s): Unknown   Norvasc [Amlodipine Besylate] Other (See Comments)    "extreme fluid retention/pain)   Optivar [Azelastine Hcl] Photosensitivity   Repaglinide Hives   Sular [Nisoldipine Er] Other (See Comments)    "severe headaches,  swelling eyes, hands, feet, shortness of breath, weak, flushed face, brain boiling, fluid retention, high blood sugar, nervous, heart fast beating"   Telmisartan Other (See Comments)    "headache, difficulty urinating, high blood sugar, fluid retention"   Amoxicillin-Pot Clavulanate Rash    Tolerates Unasyn (>10 doses)   Cefdinir Swelling    Vaginal irritation, breathing,    Ciprofloxacin Hcl Hives   Clonidine Other (See Comments)    Dry mouth, fluid retention   Codeine Nausea And Vomiting   Ezetimibe Other (See Comments)    Made weak  Hydroxychloroquine Other (See Comments)    Low platlets   Naproxen Other (See Comments)    Shrinks bladder   Sulfa Antibiotics Rash   Ziac [Bisoprolol-Hydrochlorothiazide] Other (See Comments)    "stopped urination"   Amlodipine Besy-Benazepril Hcl Cough   Azelastine Other (See Comments)    Unknown reaction - Per MAR   Elavil [Amitriptyline] Other (See Comments)    Gave Pt nightmares   Empagliflozin Other (See Comments)    "Caused yeast infection, slowed my urine"   Hydralazine Other (See Comments)    "does not reduce high blood pressure, pain in arm, high pressure, felt like I was on verge of heart attack, really weak"   Iodine I 131 Tositumomab Other (See Comments)    Unknown reaction - Per MAR   Kenalog [Triamcinolone] Diarrhea    Per MAR   Keppra [Levetiracetam] Other (See Comments)    Shaking   Lamotrigine Itching and Other (See Comments)   Pregabalin Swelling and Other (See Comments)    Weight gain   Pseudoephedrine Other (See Comments)    Unknown reaction   Risedronate     Unknown reaction - Per MAR   Ru-Hist D [Brompheniramine-Phenylephrine] Other (See Comments)    Unknown reaction - Per MAR   Sumatriptan Other (See Comments)   Topiramate Other (See Comments)    Dry eyes   Ace Inhibitors Other (See Comments)    unknown   Aspirin Other (See Comments)    Unknown reaction   Atacand [Candesartan] Other (See Comments)    Unknown  reaction - MAR   Bextra [Valdecoxib] Other (See Comments)    Unknown reaction - Per MAR   Bisoprolol-Hydrochlorothiazide Other (See Comments)    Unknown reaction - Per MAR   Cefadroxil Other (See Comments)    Unknown reaction   Celecoxib Rash   Hydrocodone Other (See Comments)    unknown   Iodinated Contrast Media Rash and Other (See Comments)    "All over" rash   Meloxicam Other (See Comments)    unknown   Methylprednisolone Sodium Succinate Other (See Comments)    unknown   Nabumetone Other (See Comments)    Unknown reaction   Penicillins Other (See Comments)    unknown   Pseudoephedrine-Guaifenesin Other (See Comments)    unknown   Rofecoxib Other (See Comments)    Unknown reaction - MAR Other reaction(s): Unknown   Ru-Tuss [Chlorphen-Pse-Atrop-Hyos-Scop] Other (See Comments)    unknown   Sulfonamide Derivatives Other (See Comments)    unknown   Sulphur [Elemental Sulfur] Other (See Comments)    unknown   Telithromycin Other (See Comments)    unknown   Terfenadine Other (See Comments)    Unknown reaction - Per MAR   Trandolapril-Verapamil Hcl Er Other (See Comments)    Headache, difficulty urinating, high blood sugar, fluid retention  Pt is taking Tarka (trandolapril-verapamil) currently, but requests the medication stay in her allergy list   Valium [Diazepam] Other (See Comments)    Makes her mean and hyper    Medications: I have reviewed the patient's current medications. Scheduled:  acetaminophen  500 mg Oral TID   cholecalciferol  5,000 Units Oral Daily   feeding supplement  237 mL Oral BID BM   insulin aspart  0-15 Units Subcutaneous TID WC   insulin aspart  0-5 Units Subcutaneous QHS   insulin glargine-yfgn  13 Units Subcutaneous QHS   levothyroxine  75 mcg Oral QAC breakfast   pantoprazole  40 mg Oral Daily   phenytoin  300 mg Oral QHS   polyethylene glycol  17 g Oral BID   polyvinyl alcohol  2 drop Both Eyes TID   primidone  150 mg Oral QHS    primidone  250 mg Oral Daily   senna-docusate  2 tablet Oral BID   sodium chloride flush  3 mL Intravenous Q12H   sodium chloride  2 g Oral BID WC   thiamine  250 mg Oral Daily   verapamil  180 mg Oral Daily    Results for orders placed or performed during the hospital encounter of 12/26/22 (from the past 24 hour(s))  CBC     Status: Abnormal   Collection Time: 12/26/22  7:23 PM  Result Value Ref Range   WBC 12.6 (H) 4.0 - 10.5 K/uL   RBC 3.30 (L) 3.87 - 5.11 MIL/uL   Hemoglobin 10.0 (L) 12.0 - 15.0 g/dL   HCT 78.2 (L) 95.6 - 21.3 %   MCV 93.0 80.0 - 100.0 fL   MCH 30.3 26.0 - 34.0 pg   MCHC 32.6 30.0 - 36.0 g/dL   RDW 08.6 (H) 57.8 - 46.9 %   Platelets 571 (H) 150 - 400 K/uL   nRBC 0.0 0.0 - 0.2 %  Creatinine, serum     Status: None   Collection Time: 12/26/22  7:23 PM  Result Value Ref Range   Creatinine, Ser 0.53 0.44 - 1.00 mg/dL   GFR, Estimated >62 >95 mL/min  Troponin I (High Sensitivity)     Status: None   Collection Time: 12/26/22  7:23 PM  Result Value Ref Range   Troponin I (High Sensitivity) 5 <18 ng/L  Glucose, capillary     Status: Abnormal   Collection Time: 12/26/22 11:00 PM  Result Value Ref Range   Glucose-Capillary 149 (H) 70 - 99 mg/dL  Type and screen [facility]     Status: None   Collection Time: 12/26/22 11:11 PM  Result Value Ref Range   ABO/RH(D) A POS    Antibody Screen NEG    Sample Expiration      12/29/2022,2359 Performed at [facility], [address] [place], [place] [address]   Culture, blood (Routine X 2) w Reflex to ID Panel     Status: None (Preliminary result)   Collection Time: 12/26/22 11:12 PM   Specimen: BLOOD  Result Value Ref Range   Specimen Description      BLOOD BLOOD LEFT ARM Performed at [facility], [address], [place], [place] [address]    Special Requests      BOTTLES DRAWN AEROBIC AND ANAEROBIC Blood Culture adequate volume Performed at [facility], [address] [place], [place] [address]    Culture      NO GROWTH < 12 HOURS Performed at [facility], [address] [place], [place] [address]    Report Status PENDING   Culture, blood (Routine X 2) w Reflex to ID Panel     Status: None (Preliminary result)   Collection Time: 12/26/22 11:12 PM   Specimen: BLOOD  Result Value Ref Range   Specimen Description      BLOOD BLOOD LEFT HAND Performed at [facility], [address] [place], [place] [address]    Special Requests      BOTTLES DRAWN AEROBIC AND ANAEROBIC Blood Culture adequate volume Performed at [facility], [address], [place], [place] [address]    Culture  NO GROWTH < 12 HOURS Performed at [facility], [address] [place], [place] [address]    Report Status PENDING   APTT     Status: None   Collection Time: 12/27/22  6:53 AM  Result Value Ref Range   aPTT 36 24 - 36 seconds  Protime-INR     Status: None   Collection Time: 12/27/22  6:53 AM  Result Value Ref Range   Prothrombin Time 14.7 11.4 - 15.2 seconds   INR 1.1 0.8 - 1.2  Basic metabolic panel     Status: Abnormal   Collection Time: 12/27/22  6:53 AM  Result Value Ref Range   Sodium 128 (L) 135 - 145 mmol/L   Potassium 3.8 3.5 - 5.1 mmol/L   Chloride 97 (L) 98 - 111 mmol/L   CO2 22 22 - 32 mmol/L   Glucose, Bld 71 70 - 99 mg/dL   BUN 8 8 - 23 mg/dL   Creatinine, Ser 3.24 0.44 - 1.00 mg/dL   Calcium 8.0 (L) 8.9 - 10.3 mg/dL   GFR, Estimated >40 >10 mL/min   Anion gap 9 5 - 15  CBC     Status: Abnormal   Collection Time: 12/27/22  6:53 AM  Result Value Ref Range   WBC 9.6 4.0 - 10.5 K/uL   RBC 3.72 (L) 3.87 - 5.11 MIL/uL   Hemoglobin 11.2 (L) 12.0 - 15.0 g/dL   HCT 27.2 (L) 53.6 - 64.4 %   MCV 95.4 80.0 - 100.0 fL   MCH 30.1 26.0 - 34.0 pg   MCHC 31.5 30.0 - 36.0 g/dL   RDW 03.4 (H) 74.2 - 59.5 %   Platelets 588 (H) 150 - 400 K/uL   nRBC 0.0 0.0 - 0.2 %   Glucose, capillary     Status: None   Collection Time: 12/27/22  8:14 AM  Result Value Ref Range   Glucose-Capillary 75 70 - 99 mg/dL   Comment 1 Notify RN   Basic metabolic panel     Status: Abnormal   Collection Time: 12/27/22  9:34 AM  Result Value Ref Range   Sodium 126 (L) 135 - 145 mmol/L   Potassium 4.1 3.5 - 5.1 mmol/L   Chloride 96 (L) 98 - 111 mmol/L   CO2 21 (L) 22 - 32 mmol/L   Glucose, Bld 148 (H) 70 - 99 mg/dL   BUN 9 8 - 23 mg/dL   Creatinine, Ser 6.38 0.44 - 1.00 mg/dL   Calcium 7.7 (L) 8.9 - 10.3 mg/dL   GFR, Estimated >75 >64 mL/min   Anion gap 9 5 - 15  Glucose, capillary     Status: Abnormal   Collection Time: 12/27/22 11:59 AM  Result Value Ref Range   Glucose-Capillary 161 (H) 70 - 99 mg/dL   Comment 1 Notify RN   Glucose, capillary     Status: Abnormal   Collection Time: 12/27/22  4:39 PM  Result Value Ref Range   Glucose-Capillary 214 (H) 70 - 99 mg/dL   Comment 1 Notify RN    *Note: Due to a large number of results and/or encounters for the requested time period, some results have not been displayed. A complete set of results can be found in Results Review.     X-ray: EXAM: DG HIP (WITH OR WITHOUT PELVIS) 3V LEFT   COMPARISON:  12/09/2022.  12/12/2022   FINDINGS: No fracture or dislocation bilateral hip arthroplasty seen with Press-Fit components. The right femoral stem is  not included in the imaging field of the left hip exam. There is also a cerclage wire on the left. Overlapping skin staples. No hardware failure. No fracture or dislocation. Vascular calcifications   IMPRESSION: Osteopenia. Bilateral hip arthroplasties. Left-side femoral stem has a cerclage wire as well as overlapping skin staples. Please correlate with time course of surgery     Electronically Signed   By: Karen Kays M.D.  CLINICAL DATA:  Pain.   EXAM: LEFT KNEE - COMPLETE 4 VIEW   COMPARISON:  None Available.   FINDINGS: Osteopenia. Small osteophytes of  all 3 compartments. No joint effusion on lateral view. No fracture or dislocation.   IMPRESSION: Osteopenia with degenerative changes.     Electronically Signed   By: Karen Kays M.D.  ROS: As per HPI  Blood pressure 137/70, pulse 80, temperature 97.9 F (36.6 C), temperature source Oral, resp. rate 18, height 5\' 7"  (1.702 m), weight 65 kg, SpO2 99%.  Physical Exam: General: Patient did not appear to be in any distress on initial encounter.  Deferred a lot of history taking to patient's family member at the bedside.  However during exam became tearful when the left leg was attempted to be manipulated. Respiratory exam: Bilateral intravesicular, limited as patient could not be sat up Cardiovascular exam S1-S2 normal Abdomen all quadrants soft nontender. No CVA tenderness elicited with patient recumbent. Extremities have 1+ edema.  In the knee and lower leg.  Patient was able to demonstrate reasonable function of her right lower extremity and bilateral upper extremity, although patient is markedly deconditioned.  But patient flat out refused/could not move the left lower extremity at all.  Further any attempt to mobilize it was extremely excruciatingly painful.  No skin changes are appreciated there is a dressing of the left inguinal area from the prior surgery still in place.  There is a quarter sized area of blood staining on her Aquacel dressing. Pelvis is stable to lateral compression  Assessment/Plan: 1.  2-week status post left total hip arthroplasty performed for a proximal femur fracture 2.  History of right total hip replacement 3.  Left knee osteoarthritis and pain  Plan: I reviewed her presentation.  We discussed exam findings.  Radiographically there is no evidence of any acute pathology to her left total hip arthroplasty or periprosthetic structures.  She does have evidence of left knee osteoarthritis.  She has been significantly limited with the ability to move her left  lower extremity since the time of surgery.  We discussed that this could be normal particularly related to age.  She is encouraged to work on trying to reestablish isometric muscle tone and function as described with patient and family in the room.  Her surgical dressing can be removed prior to her discharge.  Based on the radiographic appearance of her left total hip replacement performed while she has been in the hospital we will likely discuss this with Dr. Linna Caprice and arrange for follow-up in 2 to 4 weeks.  From an orthopedic standpoint her left hip remained stable.  Physical therapy can work on mobilization as well as determine disposition status.  If there are further questions or concerns regarding her progress please reach out to Dr. Linna Caprice and/or his PA, [facility].  Shelda Pal 12/27/2022, 5:45 PM

## 2022-12-27 NOTE — Plan of Care (Signed)
  Problem: Education: Goal: Knowledge of the prescribed therapeutic regimen will improve Outcome: Not Progressing   Problem: Activity: Goal: Ability to avoid complications of mobility impairment will improve Outcome: Progressing   Problem: Activity: Goal: Ability to tolerate increased activity will improve Outcome: Not Progressing   Problem: Pain Management: Goal: Pain level will decrease with appropriate interventions Outcome: Progressing

## 2022-12-28 DIAGNOSIS — R52 Pain, unspecified: Secondary | ICD-10-CM | POA: Diagnosis not present

## 2022-12-28 LAB — GLUCOSE, CAPILLARY
Glucose-Capillary: 123 mg/dL — ABNORMAL HIGH (ref 70–99)
Glucose-Capillary: 218 mg/dL — ABNORMAL HIGH (ref 70–99)
Glucose-Capillary: 154 mg/dL — ABNORMAL HIGH (ref 70–99)
Glucose-Capillary: 144 mg/dL — ABNORMAL HIGH (ref 70–99)

## 2022-12-28 LAB — URINE CULTURE: Culture: >=100000 — AB

## 2022-12-28 LAB — BASIC METABOLIC PANEL
Anion gap: 8 (ref 5–15)
BUN: 11 mg/dL (ref 8–23)
CO2: 23 mmol/L (ref 22–32)
Calcium: 7.7 mg/dL — ABNORMAL LOW (ref 8.9–10.3)
Chloride: 97 mmol/L — ABNORMAL LOW (ref 98–111)
Creatinine, Ser: 0.47 mg/dL (ref 0.44–1.00)
GFR, Estimated: >60 mL/min (ref >60)
Glucose, Bld: 128 mg/dL — ABNORMAL HIGH (ref 70–99)
Potassium: 3.8 mmol/L (ref 3.5–5.1)
Sodium: 128 mmol/L — ABNORMAL LOW (ref 135–145)

## 2022-12-28 LAB — VITAMIN B12: Vitamin B-12: 1197 pg/mL — ABNORMAL HIGH (ref 180–914)

## 2022-12-28 LAB — CBC
HCT: 31.4 % — ABNORMAL LOW (ref 36.0–46.0)
Hemoglobin: 9.8 g/dL — ABNORMAL LOW (ref 12.0–15.0)
MCH: 30.1 pg (ref 26.0–34.0)
MCHC: 31.2 g/dL (ref 30.0–36.0)
MCV: 96.3 fL (ref 80.0–100.0)
Platelets: 516 10*3/uL — ABNORMAL HIGH (ref 150–400)
RBC: 3.26 MIL/uL — ABNORMAL LOW (ref 3.87–5.11)
RDW: 15.6 % — ABNORMAL HIGH (ref 11.5–15.5)
WBC: 7.6 10*3/uL (ref 4.0–10.5)
nRBC: 0 % (ref 0.0–0.2)

## 2022-12-28 MED ORDER — FLEET ENEMA RE ENEM: 1.0000 | ENEMA | Freq: Once | RECTAL | Status: DC

## 2022-12-28 MED ORDER — ONDANSETRON HCL 4 MG/2ML IJ SOLN: 4.0000 mg | Freq: Four times a day (QID) | INTRAMUSCULAR | Status: DC | PRN

## 2022-12-28 MED ORDER — FUROSEMIDE 10 MG/ML IJ SOLN
40.0000 mg | Freq: Once | INTRAMUSCULAR | Status: AC
  Administered 2022-12-28: 40 mg via INTRAVENOUS
  Filled 2022-12-28: qty 4

## 2022-12-28 MED ORDER — ENOXAPARIN SODIUM 40 MG/0.4ML IJ SOSY
40.0000 mg | PREFILLED_SYRINGE | INTRAMUSCULAR | Status: DC
  Administered 2022-12-28 – 2023-01-03 (×7): 40 mg via SUBCUTANEOUS
  Filled 2022-12-28 (×7): qty 0.4

## 2022-12-28 NOTE — Progress Notes (Signed)
PROGRESS NOTE    [name]  RUE:[id number] DOB: May 11, 1929 DOA: 12/26/2022 PCP: Verlon Au, MD   Brief Narrative: 89 year old with past medical history significant for close left hip fracture status post ORIF 12/2022, patient has not been able to bearing weight since her surgery and has had progressive loss of function of the right lower extremity.  Patient was being turned when she apparently had her left leg positioned in an awkward manner causing severe pain.  She was also found to have UTI.   Assessment & Plan:   Principal Problem:   Pain Active Problems:   Seizure disorder (HCC)   Hyponatremia   Constipation   Pyelonephritis   UTI (urinary tract infection)   Pancreatic mass   Hernia of abdominal cavity   1-Left leg pain Recent femoral fracture status post ORIF 12/12/2022, by Dr Linna Caprice.  -CT left Femur: Prior left hip replacement.  No hardware complicating feature. No acute bony abnormality. Irregular ill-defined fluid collection and gas seen anterior to the left hip replacement within the quadriceps muscle extending inferiorly into the mid thigh region. This could reflect postoperative hematoma although infection/developing abscess cannot be excluded. -Hemoglobin stable at 10. --9 resume Lovenox dvt prophylaxis.  -she has been declining eliquis at her facility.  -Schedule tylenol.. continue with PRN oxycodone. Flexeril prn.  -Evaluated by ortho, no acute intervention required.  -Continue with PT  Urinary tract infection: Patient with leukocytosis, pyuria Follow urine culture: E coli, 100K. Pan sensitive.  Follow blood culture. No growth to date.  Continue with ceftriaxone  Left Knee pain, edema; Ortho to follow up on patient today  Tylenol, voltaren.   Hernia of abdominal cavity: Seen on CT scan, outpatient follow-up -  Anasarca; LE edema;  Doppler negative for DVT  Plan to repeat IV lasix today.  Started ensure, improve nutrition.   Pancreatic  mass: Stable diffuse biliary ductal dilatation, with common bile duct measuring 21 mm. Known previously, need outpatient follow-up  Hyponatremia: chronic, prior sodium 125---129 She was on sodium tablet last admission.  Resume sodium tablet.  Monitor on lasix.  Monitor..  Stable 128 today.   Constipation, fecal impaction: Per nursing staff, patient has had multiples BM since yesterday.   Dulcolax suppository   Seizure disorder: Continue with phenytoin.   DVT prophylaxis;  she has been refusing eliquis at rehab,    Estimated body mass index is 22.44 kg/m as calculated from the following:   Height as of this encounter: 5\' 7"  (1.702 m).   Weight as of this encounter: 65 kg.   DVT prophylaxis: SCD Code Status: DNR Family Communication: sister at bedside Disposition Plan:  Status is: Inpatient Remains inpatient appropriate because: management of pain, UTI    Consultants:  Ortho  Procedures:  Doppler  Antimicrobials:    Subjective: She report left leg pain is better.  Per nurse patient had multiples BM She still have LE edema  Objective: Vitals:   12/27/22 0439 12/27/22 1359 12/27/22 2034 12/28/22 0521  BP: (!) 157/75 137/70 (!) 127/57 138/69  Pulse: 72 80 76 82  Resp: 18 18 17 18   Temp: 97.7 F (36.5 C) 97.9 F (36.6 C) 97.8 F (36.6 C) 97.8 F (36.6 C)  TempSrc: Axillary Oral Oral Oral  SpO2: 100% 99% 96% 99%  Weight:      Height:        Intake/Output Summary (Last 24 hours) at 12/28/2022 0849 Last data filed at 12/28/2022 0500 Gross per 24 hour  Intake 607.91 ml  Output 1200 ml  Net -592.09 ml   Filed Weights   12/26/22 1141  Weight: 65 kg    Examination:  General exam: NAD Respiratory system: CTA Cardiovascular system: S 1, S 2 RRR. Gastrointestinal system: BS present, soft, nt Central nervous system: alert Extremities: plus 2 edema  Data Reviewed: I have personally reviewed following labs and imaging studies  CBC: Recent Labs   Lab 12/26/22 1339 12/26/22 1923 12/27/22 0653 12/28/22 0804  WBC 11.4* 12.6* 9.6 7.6  NEUTROABS 8.6*  --   --   --   HGB 10.5* 10.0* 11.2* 9.8*  HCT 31.4* 30.7* 35.5* 31.4*  MCV 90.5 93.0 95.4 96.3  PLT 765* 571* 588* 516*   Basic Metabolic Panel: Recent Labs  Lab 12/26/22 1339 12/26/22 1923 12/27/22 0653 12/27/22 0934 12/28/22 0804  NA 127*  --  128* 126* 128*  K 3.6  --  3.8 4.1 3.8  CL 98  --  97* 96* 97*  CO2 21*  --  22 21* 23  GLUCOSE 133*  --  71 148* 128*  BUN 7*  --  8 9 11   CREATININE 0.49 0.53 0.48 0.54 0.47  CALCIUM 7.7*  --  8.0* 7.7* 7.7*   GFR: Estimated Creatinine Clearance: 45.5 mL/min (by C-G formula based on SCr of 0.47 mg/dL). Liver Function Tests: Recent Labs  Lab 12/26/22 1339  AST 15  ALT 13  ALKPHOS 114  BILITOT 0.5  PROT 5.1*  ALBUMIN 2.5*   Recent Labs  Lab 12/26/22 1339  LIPASE 55*   No results for input(s): "AMMONIA" in the last 168 hours. Coagulation Profile: Recent Labs  Lab 12/27/22 0653  INR 1.1   Cardiac Enzymes: No results for input(s): "CKTOTAL", "CKMB", "CKMBINDEX", "TROPONINI" in the last 168 hours. BNP (last 3 results) No results for input(s): "PROBNP" in the last 8760 hours. HbA1C: No results for input(s): "HGBA1C" in the last 72 hours. CBG: Recent Labs  Lab 12/27/22 0814 12/27/22 1159 12/27/22 1639 12/27/22 2049 12/28/22 0713  GLUCAP 75 161* 214* 268* 123*   Lipid Profile: No results for input(s): "CHOL", "HDL", "LDLCALC", "TRIG", "CHOLHDL", "LDLDIRECT" in the last 72 hours. Thyroid Function Tests: No results for input(s): "TSH", "T4TOTAL", "FREET4", "T3FREE", "THYROIDAB" in the last 72 hours. Anemia Panel: No results for input(s): "VITAMINB12", "FOLATE", "FERRITIN", "TIBC", "IRON", "RETICCTPCT" in the last 72 hours. Sepsis Labs: No results for input(s): "PROCALCITON", "LATICACIDVEN" in the last 168 hours.  Recent Results (from the past 240 hour(s))  Urine Culture     Status: Abnormal    Collection Time: 12/26/22  4:21 PM   Specimen: Urine, Clean Catch  Result Value Ref Range Status   Specimen Description   Final    URINE, CLEAN CATCH Performed at [facility], [address], [place], [place] [address]    Special Requests   Final    NONE Performed at [facility], [address], [place], [place] [address]    Culture >=100,000 COLONIES/mL ESCHERICHIA COLI (A)  Final   Report Status 12/28/2022 FINAL  Final   Organism ID, Bacteria ESCHERICHIA COLI (A)  Final      Susceptibility   Escherichia coli - MIC*    AMPICILLIN 8 SENSITIVE Sensitive     CEFAZOLIN <=4 SENSITIVE Sensitive     CEFEPIME <=0.12 SENSITIVE Sensitive     CEFTRIAXONE <=0.25 SENSITIVE Sensitive     CIPROFLOXACIN <=0.25 SENSITIVE Sensitive     GENTAMICIN <=1 SENSITIVE Sensitive     IMIPENEM <=0.25  SENSITIVE Sensitive     NITROFURANTOIN <=16 SENSITIVE Sensitive     TRIMETH/SULFA <=20 SENSITIVE Sensitive     AMPICILLIN/SULBACTAM <=2 SENSITIVE Sensitive     PIP/TAZO <=4 SENSITIVE Sensitive     * >=100,000 COLONIES/mL ESCHERICHIA COLI  Culture, blood (Routine X 2) w Reflex to ID Panel     Status: None (Preliminary result)   Collection Time: 12/26/22 11:12 PM   Specimen: BLOOD  Result Value Ref Range Status   Specimen Description   Final    BLOOD BLOOD LEFT ARM Performed at [facility], [address], [place], [place] [address]    Special Requests   Final    BOTTLES DRAWN AEROBIC AND ANAEROBIC Blood Culture adequate volume Performed at [facility], [address], [place], [place] [address]    Culture   Final    NO GROWTH < 12 HOURS Performed at [facility], [address], [place], [place] [address]    Report Status PENDING  Incomplete  Culture, blood (Routine X 2) w Reflex to ID Panel     Status: None (Preliminary result)   Collection Time: 12/26/22 11:12 PM   Specimen: BLOOD  Result Value Ref Range Status    Specimen Description   Final    BLOOD BLOOD LEFT HAND Performed at [facility], [address] [place], [place] [address]    Special Requests   Final    BOTTLES DRAWN AEROBIC AND ANAEROBIC Blood Culture adequate volume Performed at [facility], [address] [place], [place] [address]    Culture   Final    NO GROWTH < 12 HOURS Performed at [facility], [address], [place], [place] [address]    Report Status PENDING  Incomplete         Radiology Studies: VAS [place] LOWER EXTREMITY VENOUS (DVT)  Result Date: 12/27/2022  Lower Venous DVT Study Patient Name:  Lacheryl S Chandra   Date of Exam:   12/27/2022 Medical Rec #: [id number]   Accession #:    [id number] Date of Birth: [birth year]  Patient Gender: F Patient Age:   68 years Exam Location:  [facility] Procedure:      VAS [place] LOWER EXTREMITY VENOUS (DVT) Referring Phys: Jon Billings Kiosha Buchan --------------------------------------------------------------------------------  Indications: Edema.  Risk Factors: Surgery. Limitations: Poor ultrasound/tissue interface, bandages and patient positioning, patient immobility, patient pain tolerance. Comparison Study: No prior studies. Performing Technologist: Chanda Busing RVT  Examination Guidelines: A complete evaluation includes B-mode imaging, spectral Doppler, color Doppler, and power Doppler as needed of all accessible portions of each vessel. Bilateral testing is considered an integral part of a complete examination. Limited examinations for reoccurring indications Schweiger be performed as noted. The reflux portion of the exam is performed with the patient in reverse Trendelenburg.  +---------+---------------+---------+-----------+----------+--------------+ RIGHT    CompressibilityPhasicitySpontaneityPropertiesThrombus Aging +---------+---------------+---------+-----------+----------+--------------+ CFV      Full           Yes      Yes                                  +---------+---------------+---------+-----------+----------+--------------+ SFJ      Full                                                        +---------+---------------+---------+-----------+----------+--------------+  FV Prox  Full                                                        +---------+---------------+---------+-----------+----------+--------------+ FV Mid   Full           Yes      Yes                                 +---------+---------------+---------+-----------+----------+--------------+ FV Distal               Yes      Yes                                 +---------+---------------+---------+-----------+----------+--------------+ PFV      Full                                                        +---------+---------------+---------+-----------+----------+--------------+ POP      Full           Yes      Yes                                 +---------+---------------+---------+-----------+----------+--------------+ PTV      Full                                                        +---------+---------------+---------+-----------+----------+--------------+ PERO     Full                                                        +---------+---------------+---------+-----------+----------+--------------+   +---------+---------------+---------+-----------+----------+-------------------+ LEFT     CompressibilityPhasicitySpontaneityPropertiesThrombus Aging      +---------+---------------+---------+-----------+----------+-------------------+ CFV                                                   Not well visualized +---------+---------------+---------+-----------+----------+-------------------+ SFJ                                                   Not well visualized +---------+---------------+---------+-----------+----------+-------------------+ FV Prox  Full           Yes      Yes                                       +---------+---------------+---------+-----------+----------+-------------------+  FV Mid                  Yes      Yes                                      +---------+---------------+---------+-----------+----------+-------------------+ FV Distal               Yes      Yes                                      +---------+---------------+---------+-----------+----------+-------------------+ PFV                                                   Not well visualized +---------+---------------+---------+-----------+----------+-------------------+ POP                     Yes      Yes                                      +---------+---------------+---------+-----------+----------+-------------------+ PTV      Full                                                             +---------+---------------+---------+-----------+----------+-------------------+ PERO                                                  Not well visualized +---------+---------------+---------+-----------+----------+-------------------+     Summary: RIGHT: - There is no evidence of deep vein thrombosis in the lower extremity. However, portions of this examination were limited- see technologist comments above.  - No cystic structure found in the popliteal fossa.  LEFT: - There is no evidence of deep vein thrombosis in the lower extremity. However, portions of this examination were limited- see technologist comments above.  - No cystic structure found in the popliteal fossa.  *See table(s) above for measurements and observations. Electronically signed by Coral Else MD on 12/27/2022 at 8:53:53 PM.    Final    DG CHEST PORT 1 VIEW  Result Date: 12/26/2022 CLINICAL DATA:  Recent surgery, patient states unable to bear weight since the surgery. Progressive loss of function of the left lower extremity. Recent left hip replacement. Urinary tract infection. EXAM: PORTABLE CHEST 1 VIEW COMPARISON:  12/09/2022  FINDINGS: The patient is rotated to the right on today's radiograph, reducing diagnostic sensitivity and specificity. The lungs appear clear. Cardiac and mediastinal contours normal. No blunting of the costophrenic angles. No discrete bony abnormality identified. IMPRESSION: 1. No acute findings. Electronically Signed   By: Gaylyn Rong M.D.   On: 12/26/2022 19:01   CT FEMUR LEFT WO CONTRAST  Result Date: 12/26/2022 CLINICAL DATA:  Left leg pain.  Stress fracture suspected. EXAM: CT OF THE LOWER LEFT  EXTREMITY WITHOUT CONTRAST TECHNIQUE: Multidetector CT imaging of the lower left extremity from the pelvis through the foot was performed according to the standard protocol. RADIATION DOSE REDUCTION: This exam was performed according to the departmental dose-optimization program which includes automated exposure control, adjustment of the mA and/or kV according to patient size and/or use of iterative reconstruction technique. COMPARISON:  None Available. FINDINGS: Bones/Joint/Cartilage Prior left hip replacement. No hardware complicating feature. No acute bony abnormality. Specifically, no fracture, subluxation, or dislocation. Ligaments Suboptimally assessed by CT. Muscles and Tendons There is gas noted around the left proximal femur and hip anteriorly within the quadriceps muscle. This tracks down in the quadriceps muscle into the mid thigh. There is also likely irregular ill-defined fluid collection anterior to left hip replacement. This is difficult to measure due to its ill-defined appearance. This could reflect postoperative hematoma, but given the gas present, cannot exclude infection/developing abscess. Soft tissues As above IMPRESSION: Prior left hip replacement.  No hardware complicating feature. No acute bony abnormality. Irregular ill-defined fluid collection and gas seen anterior to the left hip replacement within the quadriceps muscle extending inferiorly into the mid thigh region. This could  reflect postoperative hematoma although infection/developing abscess cannot be excluded. Electronically Signed   By: Charlett Nose M.D.   On: 12/26/2022 18:29   CT EXTREMITY LOWER LEFT WO CONTRAST  Result Date: 12/26/2022 CLINICAL DATA:  Left leg pain.  Stress fracture suspected. EXAM: CT OF THE LOWER LEFT EXTREMITY WITHOUT CONTRAST TECHNIQUE: Multidetector CT imaging of the lower left extremity from the pelvis through the foot was performed according to the standard protocol. RADIATION DOSE REDUCTION: This exam was performed according to the departmental dose-optimization program which includes automated exposure control, adjustment of the mA and/or kV according to patient size and/or use of iterative reconstruction technique. COMPARISON:  None Available. FINDINGS: Bones/Joint/Cartilage Prior left hip replacement. No hardware complicating feature. No acute bony abnormality. Specifically, no fracture, subluxation, or dislocation. Ligaments Suboptimally assessed by CT. Muscles and Tendons There is gas noted around the left proximal femur and hip anteriorly within the quadriceps muscle. This tracks down in the quadriceps muscle into the mid thigh. There is also likely irregular ill-defined fluid collection anterior to left hip replacement. This is difficult to measure due to its ill-defined appearance. This could reflect postoperative hematoma, but given the gas present, cannot exclude infection/developing abscess. Soft tissues As above IMPRESSION: Prior left hip replacement.  No hardware complicating feature. No acute bony abnormality. Irregular ill-defined fluid collection and gas seen anterior to the left hip replacement within the quadriceps muscle extending inferiorly into the mid thigh region. This could reflect postoperative hematoma although infection/developing abscess cannot be excluded. Electronically Signed   By: Charlett Nose M.D.   On: 12/26/2022 18:29   CT FOOT LEFT WO CONTRAST  Result Date:  12/26/2022 CLINICAL DATA:  Left leg pain.  Stress fracture suspected. EXAM: CT OF THE LOWER LEFT EXTREMITY WITHOUT CONTRAST TECHNIQUE: Multidetector CT imaging of the lower left extremity from the pelvis through the foot was performed according to the standard protocol. RADIATION DOSE REDUCTION: This exam was performed according to the departmental dose-optimization program which includes automated exposure control, adjustment of the mA and/or kV according to patient size and/or use of iterative reconstruction technique. COMPARISON:  None Available. FINDINGS: Bones/Joint/Cartilage Prior left hip replacement. No hardware complicating feature. No acute bony abnormality. Specifically, no fracture, subluxation, or dislocation. Ligaments Suboptimally assessed by CT. Muscles and Tendons There is gas noted around the left proximal  femur and hip anteriorly within the quadriceps muscle. This tracks down in the quadriceps muscle into the mid thigh. There is also likely irregular ill-defined fluid collection anterior to left hip replacement. This is difficult to measure due to its ill-defined appearance. This could reflect postoperative hematoma, but given the gas present, cannot exclude infection/developing abscess. Soft tissues As above IMPRESSION: Prior left hip replacement.  No hardware complicating feature. No acute bony abnormality. Irregular ill-defined fluid collection and gas seen anterior to the left hip replacement within the quadriceps muscle extending inferiorly into the mid thigh region. This could reflect postoperative hematoma although infection/developing abscess cannot be excluded. Electronically Signed   By: Charlett Nose M.D.   On: 12/26/2022 18:29   CT Renal Stone Study  Result Date: 12/26/2022 CLINICAL DATA:  Left abdominal and flank pain. EXAM: CT ABDOMEN AND PELVIS WITHOUT CONTRAST TECHNIQUE: Multidetector CT imaging of the abdomen and pelvis was performed following the standard protocol without IV  contrast. RADIATION DOSE REDUCTION: This exam was performed according to the departmental dose-optimization program which includes automated exposure control, adjustment of the mA and/or kV according to patient size and/or use of iterative reconstruction technique. COMPARISON:  01/23/2022 FINDINGS: Lower chest: No acute findings. Hepatobiliary: No mass visualized on this unenhanced exam. Gallbladder is unremarkable. Stable diffuse biliary ductal dilatation, with common bile duct measuring 21 mm. Pancreas: Punctate calcifications are seen mainly in the pancreatic head, consistent with chronic pancreatitis. No evidence of acute pancreatitis. A 2.3 cm low-attenuation lesion in the pancreatic tail remains stable, and Akers be due to a small pseudocyst or indolent cystic neoplasm such as a side-branch IPMN. No No evidence of pancreatic ductal dilatation. Spleen:  Within normal limits in size. Adrenals/Urinary tract: No evidence of urolithiasis or hydronephrosis. Distal ureters and bladder not well visualized due to severe beam hardening artifact from bilateral hip prostheses. Stomach/Bowel: No evidence of obstruction, inflammatory process, or abnormal fluid collections. A Richter hernia is seen in the paraumbilical region containing anti mesenteric wall of the transverse colon. Large amount of stool seen in the rectum. Vascular/Lymphatic: No pathologically enlarged lymph nodes identified. No evidence of abdominal aortic aneurysm. Reproductive: Limited visualization noted due to severe artifact from bilateral hip prostheses. Other:  Diffuse body wall edema. Musculoskeletal: No suspicious bone lesions identified. Old T12 vertebroplasty noted. IMPRESSION: No evidence of urolithiasis, hydronephrosis, or other acute findings. Stable diffuse biliary ductal dilatation, with common bile duct measuring 21 mm. Recommend correlation with liver function tests, and consider abdomen MRI without and with contrast for further evaluation.  Stable 2.3 cm low-attenuation lesion in pancreatic tail, which Nunziato be due to a small pseudocyst or indolent cystic neoplasm such as a side-branch IPMN. Continued imaging follow-up recommended in 1 year probably abdomen MRI without and with contrast. Chronic pancreatitis. No radiographic evidence of acute pancreatitis. Richter hernia in paraumbilical region containing anti mesenteric wall of transverse colon. No evidence of bowel obstruction. Large amount of stool in rectum. Recommend clinical correlation for possible fecal impaction. Diffuse body wall edema. Electronically Signed   By: Danae Orleans M.D.   On: 12/26/2022 16:06   CT L-SPINE NO CHARGE  Result Date: 12/26/2022 CLINICAL DATA:  Left hip pain.  Recent hip replacement. EXAM: CT LUMBAR SPINE WITHOUT CONTRAST TECHNIQUE: Multidetector CT imaging of the lumbar spine was performed without intravenous contrast administration. Multiplanar CT image reconstructions were also generated. RADIATION DOSE REDUCTION: This exam was performed according to the departmental dose-optimization program which includes automated exposure control, adjustment of the mA  and/or kV according to patient size and/or use of iterative reconstruction technique. COMPARISON:  Lumbar spine CT 08/17/2020 FINDINGS: Segmentation: 5 lumbar type vertebrae. Alignment: Mild lumbar levoscoliosis. Unchanged trace anterolisthesis of L2 on L3 and L4 on L5. Vertebrae: Chronic, severe, previously augmented T12 compression fracture, unchanged. No acute fracture or suspicious osseous lesion. The bones appear diffusely osteopenic. Paraspinal and other soft tissues: No acute abnormality identified in the paraspinal soft tissues. Remainder of the abdomen and pelvis reported separately. Disc levels: Unchanged appearance of disc and facet degeneration compared to the prior CT without high-grade spinal stenosis. 8 mm retropulsion of the T12 vertebral body results in borderline spinal stenosis. Right eccentric  disc bulging, endplate spurring, severe disc space height loss, and facet arthrosis at L4-5 result in moderate right neural foraminal stenosis. IMPRESSION: 1. No acute osseous abnormality. 2. Chronic T12 compression fracture. 3. Unchanged lumbar disc and facet degeneration with moderate right neural foraminal stenosis at L4-5. Electronically Signed   By: Sebastian Ache M.D.   On: 12/26/2022 15:49   DG Knee Complete 4 Views Left  Result Date: 12/26/2022 CLINICAL DATA:  Pain. EXAM: LEFT KNEE - COMPLETE 4 VIEW COMPARISON:  None Available. FINDINGS: Osteopenia. Small osteophytes of all 3 compartments. No joint effusion on lateral view. No fracture or dislocation. IMPRESSION: Osteopenia with degenerative changes. Electronically Signed   By: Karen Kays M.D.   On: 12/26/2022 13:55   DG Knee Complete 4 Views Right  Result Date: 12/26/2022 CLINICAL DATA:  Knee pain EXAM: RIGHT KNEE - COMPLETE 4 VIEW COMPARISON:  None Available. FINDINGS: Osteopenia. No fracture or dislocation. Small osteophytes seen of all 3 compartments. No joint effusion. IMPRESSION: Osteopenia.  Slight degenerative change. Electronically Signed   By: Karen Kays M.D.   On: 12/26/2022 13:54   DG Hip Unilat W or Wo Pelvis 2-3 Views Left  Result Date: 12/26/2022 CLINICAL DATA:  Low back pain EXAM: DG HIP (WITH OR WITHOUT PELVIS) 3V LEFT COMPARISON:  12/09/2022.  12/12/2022 FINDINGS: No fracture or dislocation bilateral hip arthroplasty seen with Press-Fit components. The right femoral stem is not included in the imaging field of the left hip exam. There is also a cerclage wire on the left. Overlapping skin staples. No hardware failure. No fracture or dislocation. Vascular calcifications IMPRESSION: Osteopenia. Bilateral hip arthroplasties. Left-side femoral stem has a cerclage wire as well as overlapping skin staples. Please correlate with time course of surgery Electronically Signed   By: Karen Kays M.D.   On: 12/26/2022 13:46   DG Lumbar  Spine Complete  Result Date: 12/26/2022 CLINICAL DATA:  Pain EXAM: LUMBAR SPINE - COMPLETE 5 VIEW COMPARISON:  None Available. FINDINGS: Diffuse are somewhat limited due to technique and patient's biliary tolerate the exam lateral views are extremely limited. Severe osteopenia. Preserved vertebral body height. Trace anterolisthesis suggested at L4-5 with moderate disc height loss. Prominent lower lumbar facet degenerative changes suggested. No spondylolysis. Augmentation cement seen of the spine at T12. Please correlate with history. Bilateral hip arthroplasties. Overlapping cardiac leads. IMPRESSION: Limited examination. Osteopenia with degenerative changes greatest at L4-5. Trace anterolisthesis at L4-5 Augmentation cement at T12. Electronically Signed   By: Karen Kays M.D.   On: 12/26/2022 13:42        Scheduled Meds:  acetaminophen  500 mg Oral TID   cholecalciferol  5,000 Units Oral Daily   enoxaparin (LOVENOX) injection  40 mg Subcutaneous Q24H   feeding supplement  237 mL Oral BID BM   furosemide  40 mg Intravenous  Once   insulin aspart  0-15 Units Subcutaneous TID WC   insulin aspart  0-5 Units Subcutaneous QHS   insulin glargine-yfgn  13 Units Subcutaneous QHS   levothyroxine  75 mcg Oral QAC breakfast   pantoprazole  40 mg Oral Daily   phenytoin  300 mg Oral QHS   polyethylene glycol  17 g Oral BID   polyvinyl alcohol  2 drop Both Eyes TID   primidone  150 mg Oral QHS   primidone  250 mg Oral Daily   senna-docusate  2 tablet Oral BID   sodium chloride flush  3 mL Intravenous Q12H   sodium chloride  2 g Oral BID WC   sodium phosphate  1 enema Rectal Once   thiamine  250 mg Oral Daily   verapamil  180 mg Oral Daily   Continuous Infusions:  cefTRIAXone (ROCEPHIN)  IV 2 g (12/27/22 1407)     LOS: 2 days    Time spent: 35 minutes    Neftaly Inzunza A Elie Gragert, MD [facility]   If 7PM-7AM, please contact night-coverage [email or web address]  12/28/2022, 8:49 AM

## 2022-12-28 NOTE — Progress Notes (Signed)
    Subjective:  Patient reports pain as mild to moderate.  Denies N/V/CP/SOB/Abd pain. She reports that pain in her thigh. She was scheduled today for an in office appointment for routine 2 week postoperative visit.     Objective:   VITALS:   Vitals:   12/27/22 0439 12/27/22 1359 12/27/22 2034 12/28/22 0521  BP: (!) 157/75 137/70 (!) 127/57 138/69  Pulse: 72 80 76 82  Resp: 18 18 17 18   Temp: 97.7 F (36.5 C) 97.9 F (36.6 C) 97.8 F (36.6 C) 97.8 F (36.6 C)  TempSrc: Axillary Oral Oral Oral  SpO2: 100% 99% 96% 99%  Weight:      Height:        Patient is sitting up in bed. NAD.  Neurologically intact ABD soft Neurovascular intact Sensation intact distally Intact pulses distally Dorsiflexion/Plantar flexion intact No cellulitis present Compartment soft Aquacel dressing removed. Incision well healing. Staples intact. I removed her staples today. No drainage or erythema at incision. No wound dehiscence. No subcutaneous fluid collection. Mepilex dressing placed over incision.  Edema in LE bilaterally.   Lab Results  Component Value Date   WBC 7.6 12/28/2022   HGB 9.8 (L) 12/28/2022   HCT 31.4 (L) 12/28/2022   MCV 96.3 12/28/2022   PLT 516 (H) 12/28/2022   BMET    Component Value Date/Time   NA 128 (L) 12/28/2022 0804   NA 130 (L) 12/24/2020 1143   NA 138 01/04/2013 1212   K 3.8 12/28/2022 0804   K 3.9 01/04/2013 1212   CL 97 (L) 12/28/2022 0804   CO2 23 12/28/2022 0804   CO2 22 01/04/2013 1212   GLUCOSE 128 (H) 12/28/2022 0804   GLUCOSE 285 (H) 01/04/2013 1212   BUN 11 12/28/2022 0804   BUN 14 12/24/2020 1143   BUN 10.3 01/04/2013 1212   CREATININE 0.47 12/28/2022 0804   CREATININE 0.78 12/01/2013 1659   CREATININE 1.0 01/04/2013 1212   CALCIUM 7.7 (L) 12/28/2022 0804   CALCIUM 9.6 01/04/2013 1212   EGFR 69 12/24/2020 1143   GFRNONAA >60 12/28/2022 0804     Assessment/Plan:     Principal Problem:   Pain Active Problems:   Seizure disorder  (HCC)   Hyponatremia   Constipation   Pyelonephritis   UTI (urinary tract infection)   Pancreatic mass   Hernia of abdominal cavity   WBAT with walker DVT ppx: Lovenox, SCDs, TEDS PO pain control PT/OT: Continue PT as tolerated.  Dispo:  - Patient is 2 weeks postop, Aquacel dressing and staples removed today. Incision is well healing. Mepilex dressing placed over incision. Change daily as needed. Continue PT as tolerated. I reviewed her x-rays 12/26/22 prosthesis in excellent alignment, no periprosthetic complications. Continue to ice LLE for pain and swelling control. Patient can follow-up in office in 4 weeks for repeat evaluation.    Clois Dupes, PA-C 12/28/2022, 11:40 AM    [facility]  [facility] [address], [place], [place] [address] Phone: [phone] [email or web address] Facebook  [employer]

## 2022-12-28 NOTE — Plan of Care (Signed)
  Problem: Education: Goal: Understanding of discharge needs will improve Outcome: Progressing   Problem: Activity: Goal: Ability to tolerate increased activity will improve Outcome: Progressing   Problem: Clinical Measurements: Goal: Postoperative complications will be avoided or minimized Outcome: Progressing   Problem: Pain Management: Goal: Pain level will decrease with appropriate interventions Outcome: Progressing   Problem: Skin Integrity: Goal: Will show signs of wound healing Outcome: Progressing   Problem: Clinical Measurements: Goal: Will remain free from infection Outcome: Progressing   Problem: Elimination: Goal: Will not experience complications related to bowel motility Outcome: Progressing Goal: Will not experience complications related to urinary retention Outcome: Progressing   Problem: Pain Managment: Goal: General experience of comfort will improve Outcome: Progressing   Problem: Skin Integrity: Goal: Risk for impaired skin integrity will decrease Outcome: Progressing

## 2022-12-28 NOTE — TOC Initial Note (Signed)
Transition of Care [facility]) - Initial/Assessment Note    Patient Details  Name: [name] MRN: [id number] Date of Birth: [birth year]  [facility]) CM/SW Contact:    Otelia Santee, LCSW Phone Number: 12/28/2022, 3:46 PM  Clinical Narrative:                 Mer with pt, niece, and sister at bedside. Pt was recently discharged to [facility] for ST SNF on 12/15/22. Pt and family prefer for pt not to return to the same facility at discharge due to concern with certain staff members. Family does not currently have preference for another SNF. MD notified of pt's wishes. Will await PT eval/recommendation prior to sending referrals for placement.  Expected Discharge Plan: Skilled Nursing Facility Barriers to Discharge: SNF Pending bed offer   Patient Goals and CMS Choice Patient states their goals for this hospitalization and ongoing recovery are:: To get placement at an alternate SNF CMS Medicare.gov Compare Post Acute Care list provided to:: Patient Represenative (must comment) Choice offered to / list presented to : [facility] POA / Guardian [place] ownership interest in [facility].provided to:: [facility] POA / Guardian    Expected Discharge Plan and Services In-house Referral: Clinical Social Work Discharge Planning Services: NA Post Acute Care Choice: Skilled Nursing Facility Living arrangements for the past 2 months: Single Family Home                 DME Arranged: N/A DME Agency: NA                  Prior Living Arrangements/Services Living arrangements for the past 2 months: Single Family Home Lives with:: Self Patient language and need for interpreter reviewed:: Yes Do you feel safe going back to the place where you live?: Yes      Need for Family Participation in Patient Care: Yes (Comment) Care giver support system in place?: Yes (comment) Current home services: DME, Homehealth aide Criminal Activity/Legal Involvement Pertinent to Current  Situation/Hospitalization: No - Comment as needed  Activities of Daily Living Home Assistive Devices/Equipment: Cane (specify quad or straight) ADL Screening (condition at time of admission) Patient's cognitive ability adequate to safely complete daily activities?: Yes Is the patient deaf or have difficulty hearing?: No Does the patient have difficulty seeing, even when wearing glasses/contacts?: No Does the patient have difficulty concentrating, remembering, or making decisions?: No Patient able to express need for assistance with ADLs?: Yes Does the patient have difficulty dressing or bathing?: Yes Independently performs ADLs?: No Communication: Needs assistance Is this a change from baseline?: Change from baseline, expected to last >3 days Dressing (OT): Needs assistance Is this a change from baseline?: Change from baseline, expected to last >3 days Grooming: Needs assistance Is this a change from baseline?: Change from baseline, expected to last >3 days Feeding: Independent Is this a change from baseline?: Change from baseline, expected to last >3 days Bathing: Needs assistance Is this a change from baseline?: Pre-admission baseline Toileting: Needs assistance Is this a change from baseline?: Pre-admission baseline In/Out Bed: Needs assistance Is this a change from baseline?: Pre-admission baseline Walks in Home: Dependent Is this a change from baseline?: Pre-admission baseline Does the patient have difficulty walking or climbing stairs?: Yes Weakness of Legs: Left Weakness of Arms/Hands: None  Permission Sought/Granted Permission sought to share information with : Facility Medical sales representative, Family Supports Permission granted to share information with : Yes, Verbal Permission Granted  Emotional Assessment Appearance:: Appears stated age Attitude/Demeanor/Rapport: Lethargic   Orientation: : Oriented to Self, Oriented to Place, Oriented to  Time,  Oriented to Situation Alcohol / Substance Use: Not Applicable Psych Involvement: No (comment)  Admission diagnosis:  Pyelonephritis [N12] Acute cystitis without hematuria [N30.00] Patient Active Problem List   Diagnosis Date Noted   Pyelonephritis 12/26/2022   UTI (urinary tract infection) 12/26/2022   Pancreatic mass 12/26/2022   Hernia of abdominal cavity 12/26/2022   Pain 12/26/2022   Closed left hip fracture, initial encounter (HCC) 12/09/2022   Generalized weakness 10/28/2022   Left hand pain 10/28/2022   SBO (small bowel obstruction) (HCC) 01/24/2022   Osteopenia 10/06/2021   Family history of malignant neoplasm of digestive organs 10/06/2021   History of malignant neoplasm of colon 10/06/2021   Hx of compression fracture of spine 10/06/2021   Intestinal malabsorption 10/06/2021   Pharyngeal dysphagia 10/06/2021   Carpal tunnel syndrome of left wrist 09/22/2021   CAP (community acquired pneumonia) 04/03/2021   Numbness of hand 01/13/2021   DDD (degenerative disc disease), cervical 12/03/2020   Compression fracture of body of thoracic vertebra (HCC) 08/21/2020   Dilantin toxicity 08/17/2020   Fracture of neck of femur (HCC) 07/11/2020   Postoperative anemia due to acute blood loss 06/06/2020   Leukocytosis    Closed right hip fracture (HCC) 06/04/2020   Constipation 05/27/2020   Dyslipidemia 05/27/2020   Enterocolitis due to Clostridium difficile, not specified as recurrent 05/27/2020   Pleural effusion on right 04/01/2020   Acute respiratory failure with hypoxia (HCC) 04/01/2020   Pneumonia of right lung due to infectious organism 04/01/2020   Uncontrolled type 2 diabetes mellitus with hyperglycemia, with long-term current use of insulin (HCC) 04/01/2020   Abnormal CT of the chest 04/01/2020   Subacute pulmonary embolism (HCC) 04/01/2020   Acute metabolic encephalopathy 04/01/2020   Adenocarcinoma of colon (HCC) 04/01/2020   Nausea & vomiting 03/12/2020   Cancer of  ascending colon pT3pN0 (0/12 LN) s/p lap right colectomy 03/06/2020 03/09/2020   History of multiple allergies 03/09/2020   S/P right hemicolectomy 03/06/2020   Closed fracture of shaft of tibia 02/12/2020   Abnormal feces 12/11/2019   Preop cardiovascular exam 12/11/2019   Gait abnormality 12/04/2019   Impaired left ventricular function 09/05/2019   Pain in joint of left shoulder 02/27/2019   Hypokalemia    Effusion of left knee joint    Hyponatremia    Left patella fracture 10/26/2018   Fracture of patella 10/26/2018   Pain in left knee 09/29/2018   Acquired hypothyroidism 08/25/2017   H/O excision of tumor of brain meninges 08/25/2017   Hammer toe 08/25/2017   History of breast cancer 08/25/2017   Nontoxic thyroid nodule 08/25/2017   Osteopetrosis 08/25/2017   Age-related osteoporosis without current pathological fracture 08/25/2017   Dysuria 01/19/2017   Vitamin D deficiency 11/18/2016   Tremor, essential 08/18/2016   Closed burst fracture of lumbar vertebra (HCC) 11/25/2015   Seizure disorder (HCC) 10/16/2014   Chronic low back pain 12/21/2013   Cerebral meningioma (HCC) 12/21/2013   Brain tumor (benign) (HCC) 10/18/2013   Subdural hemorrhage (HCC) 10/09/2013   Anxiety 10/09/2013   Compression fracture of T12 vertebra (HCC) 10/09/2013   Nontoxic multinodular goiter 09/05/2013   Syncope 08/27/2013   Carotid artery disease (HCC) 08/27/2013   Abnormal thyroid ultrasound 08/27/2013   Cancer of central portion of female breast (HCC) 12/30/2012   Osteoporosis 03/06/2010   ASTHMA 03/05/2009   Asthma 03/05/2009   IRRITABLE  BOWEL SYNDROME  diarrhea type 03/21/2008   ANEMIA-NOS 12/16/2006   GERD 12/16/2006   Insulin-requiring or dependent type II diabetes mellitus (HCC) 10/29/2006   Mixed hyperlipidemia 10/29/2006   Essential hypertension 10/29/2006   Allergic rhinitis, cause unspecified 10/29/2006   OSTEOARTHRITIS 10/29/2006   PCP:  Verlon Au, MD Pharmacy:  No  Pharmacies Listed    Social Determinants of Health (SDOH) Social History: SDOH Screenings   Food Insecurity: No Food Insecurity (12/26/2022)  Housing: Low Risk  (12/26/2022)  Transportation Needs: No Transportation Needs (12/26/2022)  Utilities: Not At Risk (12/26/2022)  Depression (PHQ2-9): Low Risk  (09/18/2020)  Social Connections: Unknown (09/16/2021)   Received from [facility]  Tobacco Use: Low Risk  (12/26/2022)   SDOH Interventions:     Readmission Risk Interventions    12/28/2022    3:43 PM 12/15/2022   11:12 AM 10/27/2022   12:30 PM  Readmission Risk Prevention Plan  Transportation Screening Complete Complete Complete  PCP or Specialist Appt within 3-5 Days Complete  Complete  HRI or Home Care Consult Complete  Complete  Social Work Consult for Recovery Care Planning/Counseling Complete  Complete  Palliative Care Screening Not Applicable    Medication Review Oceanographer) Complete Complete Complete  PCP or Specialist appointment within 3-5 days of discharge  Complete   HRI or Home Care Consult  Complete   SW Recovery Care/Counseling Consult  Complete   Palliative Care Screening  Not Applicable   Skilled Nursing Facility  Complete

## 2022-12-29 DIAGNOSIS — R52 Pain, unspecified: Secondary | ICD-10-CM | POA: Diagnosis not present

## 2022-12-29 LAB — GLUCOSE, CAPILLARY
Glucose-Capillary: 145 mg/dL — ABNORMAL HIGH (ref 70–99)
Glucose-Capillary: 184 mg/dL — ABNORMAL HIGH (ref 70–99)
Glucose-Capillary: 244 mg/dL — ABNORMAL HIGH (ref 70–99)
Glucose-Capillary: 136 mg/dL — ABNORMAL HIGH (ref 70–99)

## 2022-12-29 LAB — CBC
HCT: 29.2 % — ABNORMAL LOW (ref 36.0–46.0)
Hemoglobin: 9.4 g/dL — ABNORMAL LOW (ref 12.0–15.0)
MCH: 30.4 pg (ref 26.0–34.0)
MCHC: 32.2 g/dL (ref 30.0–36.0)
MCV: 94.5 fL (ref 80.0–100.0)
Platelets: 425 10*3/uL — ABNORMAL HIGH (ref 150–400)
RBC: 3.09 MIL/uL — ABNORMAL LOW (ref 3.87–5.11)
RDW: 15.5 % (ref 11.5–15.5)
WBC: 6.6 10*3/uL (ref 4.0–10.5)
nRBC: 0 % (ref 0.0–0.2)

## 2022-12-29 LAB — BASIC METABOLIC PANEL
Anion gap: 7 (ref 5–15)
BUN: 11 mg/dL (ref 8–23)
CO2: 24 mmol/L (ref 22–32)
Calcium: 7.7 mg/dL — ABNORMAL LOW (ref 8.9–10.3)
Chloride: 95 mmol/L — ABNORMAL LOW (ref 98–111)
Creatinine, Ser: 0.52 mg/dL (ref 0.44–1.00)
GFR, Estimated: >60 mL/min (ref >60)
Glucose, Bld: 156 mg/dL — ABNORMAL HIGH (ref 70–99)
Potassium: 3.6 mmol/L (ref 3.5–5.1)
Sodium: 126 mmol/L — ABNORMAL LOW (ref 135–145)

## 2022-12-29 MED ORDER — CLOTRIMAZOLE 2 % VA CREA: 1.0000 | TOPICAL_CREAM | Freq: Every day | VAGINAL | Status: DC

## 2022-12-29 MED ORDER — CLOTRIMAZOLE 1 % VA CREA
1.0000 | TOPICAL_CREAM | Freq: Every day | VAGINAL | Status: AC
  Administered 2022-12-29 – 2022-12-31 (×3): 1 via VAGINAL
  Filled 2022-12-29: qty 45

## 2022-12-29 NOTE — TOC Progression Note (Signed)
Transition of Care [facility]) - Progression Note    Patient Details  Name: [name] MRN: [id number] Date of Birth: Nov 28, 1929  [facility]) CM/SW Contact  Otelia Santee, LCSW Phone Number: 12/29/2022, 12:57 PM  Clinical Narrative:    Referrals have been faxed out for SNF placement. Currently awaiting bed offers.    Expected Discharge Plan: Skilled Nursing Facility Barriers to Discharge: SNF Pending bed offer  Expected Discharge Plan and Services In-house Referral: Clinical Social Work Discharge Planning Services: NA Post Acute Care Choice: Skilled Nursing Facility Living arrangements for the past 2 months: Single Family Home                 DME Arranged: N/A DME Agency: NA                   Social Determinants of Health (SDOH) Interventions SDOH Screenings   Food Insecurity: No Food Insecurity (12/26/2022)  Housing: Low Risk  (12/26/2022)  Transportation Needs: No Transportation Needs (12/26/2022)  Utilities: Not At Risk (12/26/2022)  Depression (PHQ2-9): Low Risk  (09/18/2020)  Social Connections: Unknown (09/16/2021)   Received from [facility]  Tobacco Use: Low Risk  (12/26/2022)    Readmission Risk Interventions    12/28/2022    3:43 PM 12/15/2022   11:12 AM 10/27/2022   12:30 PM  Readmission Risk Prevention Plan  Transportation Screening Complete Complete Complete  PCP or Specialist Appt within 3-5 Days Complete  Complete  HRI or Home Care Consult Complete  Complete  Social Work Consult for Recovery Care Planning/Counseling Complete  Complete  Palliative Care Screening Not Applicable    Medication Review Oceanographer) Complete Complete Complete  PCP or Specialist appointment within 3-5 days of discharge  Complete   HRI or Home Care Consult  Complete   SW Recovery Care/Counseling Consult  Complete   Palliative Care Screening  Not Applicable   Skilled Nursing Facility  Complete

## 2022-12-29 NOTE — [place] FL2 (Signed)
[place] MEDICAID FL2 LEVEL OF CARE FORM     IDENTIFICATION  Patient Name: [name] Birthdate: 07-08-1929 Sex: female Admission Date (Current Location): 12/26/2022  [facility] and [place] Number:  Producer, television/film/video and Address:  [facility],  [address] [place], [place] [address]      Provider Number: [id number]  Attending Physician Name and Address:  Alba Cory, MD  Relative Name and Phone Number:  [name] (Niece) [phone] (Home Phone) HCPOA    Current Level of Care: Hospital Recommended Level of Care: Skilled Nursing Facility Prior Approval Number:    Date Approved/Denied:   PASRR Number: [id number]  Discharge Plan: SNF    Current Diagnoses: Patient Active Problem List   Diagnosis Date Noted   Pyelonephritis 12/26/2022   UTI (urinary tract infection) 12/26/2022   Pancreatic mass 12/26/2022   Hernia of abdominal cavity 12/26/2022   Pain 12/26/2022   Closed left hip fracture, initial encounter (HCC) 12/09/2022   Generalized weakness 10/28/2022   Left hand pain 10/28/2022   SBO (small bowel obstruction) (HCC) 01/24/2022   Osteopenia 10/06/2021   Family history of malignant neoplasm of digestive organs 10/06/2021   History of malignant neoplasm of colon 10/06/2021   Hx of compression fracture of spine 10/06/2021   Intestinal malabsorption 10/06/2021   Pharyngeal dysphagia 10/06/2021   Carpal tunnel syndrome of left wrist 09/22/2021   CAP (community acquired pneumonia) 04/03/2021   Numbness of hand 01/13/2021   DDD (degenerative disc disease), cervical 12/03/2020   Compression fracture of body of thoracic vertebra (HCC) 08/21/2020   Dilantin toxicity 08/17/2020   Fracture of neck of femur (HCC) 07/11/2020   Postoperative anemia due to acute blood loss 06/06/2020   Leukocytosis    Closed right hip fracture (HCC) 06/04/2020   Constipation 05/27/2020   Dyslipidemia 05/27/2020   Enterocolitis due to Clostridium difficile, not  specified as recurrent 05/27/2020   Pleural effusion on right 04/01/2020   Acute respiratory failure with hypoxia (HCC) 04/01/2020   Pneumonia of right lung due to infectious organism 04/01/2020   Uncontrolled type 2 diabetes mellitus with hyperglycemia, with long-term current use of insulin (HCC) 04/01/2020   Abnormal CT of the chest 04/01/2020   Subacute pulmonary embolism (HCC) 04/01/2020   Acute metabolic encephalopathy 04/01/2020   Adenocarcinoma of colon (HCC) 04/01/2020   Nausea & vomiting 03/12/2020   Cancer of ascending colon pT3pN0 (0/12 LN) s/p lap right colectomy 03/06/2020 03/09/2020   History of multiple allergies 03/09/2020   S/P right hemicolectomy 03/06/2020   Closed fracture of shaft of tibia 02/12/2020   Abnormal feces 12/11/2019   Preop cardiovascular exam 12/11/2019   Gait abnormality 12/04/2019   Impaired left ventricular function 09/05/2019   Pain in joint of left shoulder 02/27/2019   Hypokalemia    Effusion of left knee joint    Hyponatremia    Left patella fracture 10/26/2018   Fracture of patella 10/26/2018   Pain in left knee 09/29/2018   Acquired hypothyroidism 08/25/2017   H/O excision of tumor of brain meninges 08/25/2017   Hammer toe 08/25/2017   History of breast cancer 08/25/2017   Nontoxic thyroid nodule 08/25/2017   Osteopetrosis 08/25/2017   Age-related osteoporosis without current pathological fracture 08/25/2017   Dysuria 01/19/2017   Vitamin D deficiency 11/18/2016   Tremor, essential 08/18/2016   Closed burst fracture of lumbar vertebra (HCC) 11/25/2015   Seizure disorder (HCC) 10/16/2014   Chronic low back pain 12/21/2013   Cerebral meningioma (HCC) 12/21/2013  Brain tumor (benign) (HCC) 10/18/2013   Subdural hemorrhage (HCC) 10/09/2013   Anxiety 10/09/2013   Compression fracture of T12 vertebra (HCC) 10/09/2013   Nontoxic multinodular goiter 09/05/2013   Syncope 08/27/2013   Carotid artery disease (HCC) 08/27/2013   Abnormal  thyroid ultrasound 08/27/2013   Cancer of central portion of female breast (HCC) 12/30/2012   Osteoporosis 03/06/2010   ASTHMA 03/05/2009   Asthma 03/05/2009   IRRITABLE BOWEL SYNDROME  diarrhea type 03/21/2008   ANEMIA-NOS 12/16/2006   GERD 12/16/2006   Insulin-requiring or dependent type II diabetes mellitus (HCC) 10/29/2006   Mixed hyperlipidemia 10/29/2006   Essential hypertension 10/29/2006   Allergic rhinitis, cause unspecified 10/29/2006   OSTEOARTHRITIS 10/29/2006    Orientation RESPIRATION BLADDER Height & Weight     Self, Time, Situation, Place  Normal Incontinent, External catheter Weight: 143 lb 4.8 oz (65 kg) Height:  5\' 7"  (170.2 cm)  BEHAVIORAL SYMPTOMS/MOOD NEUROLOGICAL BOWEL NUTRITION STATUS      Incontinent Diet (Carb modified)  AMBULATORY STATUS COMMUNICATION OF NEEDS Skin   Extensive Assist Verbally Normal                       Personal Care Assistance Level of Assistance  Bathing, Feeding, Dressing Bathing Assistance: Maximum assistance Feeding assistance: Maximum assistance Dressing Assistance: Maximum assistance     Functional Limitations Info  Sight, Hearing, Speech Sight Info: Impaired Hearing Info: Impaired Speech Info: Adequate    SPECIAL CARE FACTORS FREQUENCY  PT (By licensed PT), OT (By licensed OT)     PT Frequency: 5x/wk OT Frequency: 5x/wk            Contractures Contractures Info: Not present    Additional Factors Info  Code Status, Allergies, Psychotropic Code Status Info: DNR Allergies Info: Bystolic (Nebivolol Hcl), Carbamazepine, Cholestyramine, Morphine, Niacin, Norvasc (Amlodipine Besylate), Optivar (Azelastine Hcl), Repaglinide, Sular (Nisoldipine Er), Telmisartan, Amoxicillin-pot Clavulanate, Cefdinir, Ciprofloxacin Hcl, Clonidine, Codeine, Ezetimibe, Hydroxychloroquine, Naproxen, Sulfa Antibiotics, Ziac (Bisoprolol-hydrochlorothiazide), Amlodipine Besy-benazepril Hcl, Azelastine, Elavil (Amitriptyline),  Empagliflozin, Hydralazine, Iodine I 131 Tositumomab, Kenalog (Triamcinolone), Keppra (Levetiracetam), Lamotrigine, Pregabalin, Pseudoephedrine, Risedronate, Ru-hist D (Brompheniramine-phenylephrine), Sumatriptan, Topiramate, Ace Inhibitors, Aspirin, Atacand (Candesartan), Bextra (Valdecoxib), Bisoprolol-hydrochlorothiazide, Cefadroxil, Celecoxib, Hydrocodone, Iodinated Contrast Media, Meloxicam, Methylprednisolone Sodium Succinate, Nabumetone, Penicillins, Pseudoephedrine-guaifenesin, Rofecoxib, Ru-tuss (Chlorphen-pse-atrop-hyos-scop), Sulfonamide Derivatives, Sulphur (Elemental Sulfur), Telithromycin, Terfenadine, Trandolapril-verapamil Hcl Er, Valium (Diazepam) Psychotropic Info: N/A         Current Medications (12/29/2022):  This is the current hospital active medication list Current Facility-Administered Medications  Medication Dose Route Frequency Provider Last Rate Last Admin   acetaminophen (TYLENOL) tablet 500 mg  500 mg Oral TID Regalado, Belkys A, MD   500 mg at 12/29/22 0909   bisacodyl (DULCOLAX) suppository 10 mg  10 mg Rectal Daily PRN Regalado, Belkys A, MD   10 mg at 12/27/22 1246   cefTRIAXone (ROCEPHIN) 2 g in sodium chloride 0.9 % 100 mL IVPB  2 g Intravenous Q24H Regalado, Belkys A, MD 200 mL/hr at 12/28/22 1420 2 g at 12/28/22 1420   cholecalciferol (VITAMIN D3) 25 MCG (1000 UNIT) tablet 5,000 Units  5,000 Units Oral Daily Nolberto Hanlon, MD   5,000 Units at 12/29/22 0908   cyclobenzaprine (FLEXERIL) tablet 5 mg  5 mg Oral TID PRN Regalado, Belkys A, MD       diclofenac Sodium (VOLTAREN) 1 % topical gel 2 g  2 g Topical QID PRN Nolberto Hanlon, MD   2 g at 12/28/22 0921   enoxaparin (LOVENOX) injection 40 mg  40 mg Subcutaneous Q24H Regalado, Belkys A, MD   40 mg at 12/29/22 0912   feeding supplement (ENSURE ENLIVE / ENSURE PLUS) liquid 237 mL  237 mL Oral BID BM Regalado, Belkys A, MD   237 mL at 12/29/22 0909   HYDROmorphone (DILAUDID) injection 0.5 mg  0.5 mg Intravenous Q2H PRN  Nolberto Hanlon, MD   0.5 mg at 12/28/22 2213   insulin aspart (novoLOG) injection 0-15 Units  0-15 Units Subcutaneous TID WC Nolberto Hanlon, MD   3 Units at 12/29/22 1148   insulin aspart (novoLOG) injection 0-5 Units  0-5 Units Subcutaneous QHS Nolberto Hanlon, MD   3 Units at 12/27/22 2103   insulin glargine-yfgn (SEMGLEE) injection 13 Units  13 Units Subcutaneous QHS Nolberto Hanlon, MD   13 Units at 12/28/22 2309   levothyroxine (SYNTHROID) tablet 75 mcg  75 mcg Oral QAC breakfast Nolberto Hanlon, MD   75 mcg at 12/29/22 0516   ondansetron (ZOFRAN) injection 4 mg  4 mg Intravenous Q6H PRN Regalado, Belkys A, MD       oxyCODONE (Oxy IR/ROXICODONE) immediate release tablet 2.5 mg  2.5 mg Oral Q6H PRN Nolberto Hanlon, MD   2.5 mg at 12/29/22 1011   pantoprazole (PROTONIX) EC tablet 40 mg  40 mg Oral Daily Nolberto Hanlon, MD   40 mg at 12/29/22 8295   phenytoin (DILANTIN) ER capsule 300 mg  300 mg Oral QHS Nolberto Hanlon, MD   300 mg at 12/28/22 2215   polyethylene glycol (MIRALAX / GLYCOLAX) packet 17 g  17 g Oral BID Regalado, Belkys A, MD   17 g at 12/27/22 2104   polyvinyl alcohol (LIQUIFILM TEARS) 1.4 % ophthalmic solution 2 drop  2 drop Both Eyes TID Nolberto Hanlon, MD   2 drop at 12/29/22 0913   primidone (MYSOLINE) tablet 150 mg  150 mg Oral QHS Nolberto Hanlon, MD   150 mg at 12/28/22 2309   primidone (MYSOLINE) tablet 250 mg  250 mg Oral Daily Nolberto Hanlon, MD   250 mg at 12/29/22 0908   senna-docusate (Senokot-S) tablet 2 tablet  2 tablet Oral BID Nolberto Hanlon, MD   2 tablet at 12/28/22 2215   sodium chloride flush (NS) 0.9 % injection 3 mL  3 mL Intravenous Q12H Nolberto Hanlon, MD   3 mL at 12/28/22 0932   sodium chloride tablet 2 g  2 g Oral BID WC Regalado, Belkys A, MD   2 g at 12/29/22 0908   thiamine (VITAMIN B1) tablet 250 mg  250 mg Oral Daily Nolberto Hanlon, MD   250 mg at 12/29/22 0909   verapamil (CALAN-SR) CR tablet 180 mg  180 mg Oral Daily Nolberto Hanlon, MD   180 mg at 12/29/22 6213     Discharge  Medications: Please see discharge summary for a list of discharge medications.  Relevant Imaging Results:  Relevant Lab Results:   Additional Information SSN: [id number]  Otelia Santee, LCSW

## 2022-12-29 NOTE — Evaluation (Signed)
Occupational Therapy Evaluation Patient Details Name: [name] MRN: [id number] DOB: 06/24/1929 Today's Date: 12/29/2022   History of Present Illness 89 yo female re-admitted from rehab at [facility] where she was recovering from a left Anterior approach THA 12/12/22.  Due to UTI as well as new onset left anterior thigh and knee pain after being turned in bed at her skilled nursing facility. Hx of R THA, breast cancer, anemia, esential tremor, osteoporosis, gait abnormality, anxiety, asthma, chronic pain, Sz, DM   Clinical Impression   Patient is currently requiring assistance with ADLs including up to Total assist +2 with Lower body ADLs, up to moderate assist with Upper body ADLs,  as well as  Total assist +2 with bed mobility and Max assist of 3 with stand to [facility] and pt with decreased alertness, decreased pallor and increased pain.    Current level of function is below patient's typical baseline.  During this evaluation, patient was limited by generalized weakness, impaired activity tolerance, and 10/10 LLE pain despite premedication and coordination with Nursing to give pt optimal pain control prior to initiating OT.  Patient also showed an abnormal response to mobility as above with decreased pallor and would benefit from blood pressure being monitored.  Patient's current impairments have the potential to impact patient's and caregivers safety and independence during functional mobility, as well as performance for ADLs.    Patient demonstrates fair rehab potential, and should benefit from continued skilled occupational therapy services while in acute care to maximize safety, independence and quality of life at home.  Continued occupational therapy services at skilled nursing facility are recommended.  ?        If plan is discharge home, recommend the following: Two people to help with walking and/or transfers;Two people to help with bathing/dressing/bathroom;Direct supervision/assist for  medications management;Assist for transportation;Assistance with cooking/housework;Help with stairs or ramp for entrance;A lot of help with bathing/dressing/bathroom;A lot of help with walking and/or transfers    Functional Status Assessment  Patient has had a recent decline in their functional status and demonstrates the ability to make significant improvements in function in a reasonable and predictable amount of time.  Equipment Recommendations   (defer)    Recommendations for Other Services       Precautions / Restrictions Precautions Precautions: Fall Precaution Comments: Mon Vitals Restrictions Weight Bearing Restrictions: No LLE Weight Bearing: Weight bearing as tolerated      Mobility Bed Mobility Overal bed mobility: Needs Assistance Bed Mobility: Supine to Sit, Sit to Supine     Supine to sit: Total assist, +2 for safety/equipment, HOB elevated, Used rails Sit to supine: Total assist, HOB elevated, +2 for safety/equipment        Transfers                          Balance Overall balance assessment: Needs assistance Sitting-balance support: Bilateral upper extremity supported Sitting balance-Leahy Scale: Poor   Postural control: Posterior lean   Standing balance-Leahy Scale: Zero (stedy and assist of 3)                             ADL either performed or assessed with clinical judgement   ADL Overall ADL's : Needs assistance/impaired Eating/Feeding: Minimal assistance;Bed level   Grooming: Minimal assistance;Sitting;Wash/dry face;Oral care;Wash/dry hands;Set up;Cueing for sequencing;Cueing for safety Grooming Details (indicate cue type and reason): Tray table right anterior to patient with  cues to rest forearms on table in order to promote anterior bias for balance.  From this setting patient's need for assistance for sitting balance lessened to contact-guard assist, and patient able to use bilateral hands to open and close toothpaste  as well as place paste on brush but with mistakes.  Patient also completed hand washing and face washing while urgent bed. Upper Body Bathing: Bed level;Moderate assistance   Lower Body Bathing: Total assistance;+2 for physical assistance;Bed level   Upper Body Dressing : Moderate assistance;Sitting   Lower Body Dressing: Total assistance;Bed level   Toilet Transfer: Maximal assistance;+2 for physical assistance Toilet Transfer Details (indicate cue type and reason): Patient performed 1 stand from elevated EOB to [place] steady with max assist of 3 people, cues.  Once standing patient's head felt slumped against OT's chest and patient became quiet.  Patient then shouted out that she needed to sit and was assisted back to edge of bed.  Noted patient with marked pallor change and very pale.  Patient described feeling "weak" while seated edge of bed and while trying to stand. Toileting- Clothing Manipulation and Hygiene: Total assistance;Bed level       Functional mobility during ADLs: Moderate assistance;Maximal assistance;Cueing for safety;Cueing for sequencing;+2 for physical assistance       Vision   Vision Assessment?: No apparent visual deficits     Perception         Praxis         Pertinent Vitals/Pain Pain Assessment Pain Assessment: Faces Faces Pain Scale: Hurts worst Pain Location: L hip/knee with movement Pain Descriptors / Indicators: Grimacing, Guarding, Crying, Other (Comment), Moaning Pain Intervention(s): Limited activity within patient's tolerance, Monitored during session, Premedicated before session, Repositioned (Patient declined ice)     Extremity/Trunk Assessment Upper Extremity Assessment Upper Extremity Assessment: Generalized weakness (3+/5 to shoulders, elbows, and hands.  Left hand still recovering from a past fall with wrist fracture and patient reports also fracturing her MP joint pointing to the D2.)       Cervical / Trunk Assessment Cervical /  Trunk Assessment: Kyphotic;Other exceptions Cervical / Trunk Exceptions: head tends to lean to the left   Communication Communication Communication: No apparent difficulties   Cognition Arousal: Alert Behavior During Therapy: Anxious, WFL for tasks assessed/performed, Lability Overall Cognitive Status: No family/caregiver present to determine baseline cognitive functioning                                 General Comments: Patient presents with functional cognition while supine prior to mobility.  During mobility patient's anxiety increased and patient having too much pain to consistently follow instructions but still able to follow most.  Patient does seem grossly oriented to situation and able to give some information about how she was doing at her rehab facility.     General Comments       Exercises     Shoulder Instructions      Home Living Family/patient expects to be discharged to:: Skilled nursing facility (Patient confirmed plan to return to a skilled rehab facility. But not [facility])                             Home Equipment: Shower seat - built in;Rollator (4 wheels)          Prior Functioning/Environment  Mobility Comments: Patient reports that she has not been able to stand or take steps since her surgery on 12/12/2022 secondary to pain.          OT Problem List: Decreased strength;Decreased activity tolerance;Decreased range of motion;Impaired balance (sitting and/or standing);Decreased cognition;Decreased knowledge of use of DME or AE;Pain      OT Treatment/Interventions: Self-care/ADL training;Therapeutic exercise;Energy conservation;DME and/or AE instruction;Therapeutic activities;Cognitive remediation/compensation;Balance training;Patient/family education    OT Goals(Current goals can be found in the care plan section) Acute Rehab OT Goals Patient Stated Goal: Decreased pain OT Goal Formulation: With  patient Time For Goal Achievement: 01/12/23 Potential to Achieve Goals: Fair ADL Goals Pt Will Perform Grooming: with set-up;sitting;with supervision Pt Will Perform Lower Body Dressing: sitting/lateral leans;bed level;with adaptive equipment;with mod assist Pt Will Transfer to Toilet: bedside commode;stand pivot transfer;squat pivot transfer;with mod assist Pt/caregiver will Perform Home Exercise Program: Increased strength;Both right and left upper extremity;With Supervision;With theraputty;With theraband;With written HEP provided Additional ADL Goal #1: Pt will tolerate 20 min EOB sitting and 1 stand from EOB to assist caregivers with ADLs with no more than Mod As to stand and pt alert with VSS and pain <6/10.  OT Frequency: Min 1X/week    Co-evaluation PT/OT/SLP Co-Evaluation/Treatment: Yes Reason for Co-Treatment: For patient/therapist safety;Complexity of the patient's impairments (multi-system involvement);To address functional/ADL transfers   OT goals addressed during session: ADL's and self-care;Strengthening/ROM      AM-PAC OT "6 Clicks" Daily Activity     Outcome Measure Help from another person eating meals?: A Little Help from another person taking care of personal grooming?: A Little Help from another person toileting, which includes using toliet, bedpan, or urinal?: Total Help from another person bathing (including washing, rinsing, drying)?: A Lot Help from another person to put on and taking off regular upper body clothing?: A Lot Help from another person to put on and taking off regular lower body clothing?: Total 6 Click Score: 12   End of Session Equipment Utilized During Treatment: Gait belt Nurse Communication: Other (comment);Mobility status (Need of increased pain meds before mobility for now. RN agreeable. MD in room and also informed of pt's decreased alertness and palor changes when up with therapy)  Activity Tolerance: Patient limited by pain;Treatment  limited secondary to medical complications (Comment) Patient left: in bed;with bed alarm set;with call bell/phone within reach;with SCD's reapplied  OT Visit Diagnosis: Muscle weakness (generalized) (M62.81);History of falling (Z91.81);Pain;Dizziness and giddiness (R42) Pain - Right/Left: Left Pain - part of body: Leg                Time: 7371-0626 OT Time Calculation (min): 31 min Charges:  OT General Charges $OT Visit: 1 Visit OT Evaluation $OT Eval Moderate Complexity: 1 Mod  Maycel Riffe, OT Acute Rehab Services Office: [phone] 12/29/2022  Theodoro Clock 12/29/2022, 10:29 AM

## 2022-12-29 NOTE — Progress Notes (Signed)
PROGRESS NOTE    [name]  WJX:[id number] DOB: 1929-06-04 DOA: 12/26/2022 PCP: Verlon Au, MD   Brief Narrative: 89 year old with past medical history significant for close left hip fracture status post ORIF 12/2022, patient has not been able to bearing weight since her surgery and has had progressive loss of function of the right lower extremity.  Patient was being turned when she apparently had her left leg positioned in an awkward manner causing severe pain.  She was also found to have UTI.  She has chronic hyponatremia, she was also found to have severe constipation.  She is currently awaiting for rehab.  Assessment & Plan:   Principal Problem:   Pain Active Problems:   Seizure disorder (HCC)   Hyponatremia   Constipation   Pyelonephritis   UTI (urinary tract infection)   Pancreatic mass   Hernia of abdominal cavity   1-Left leg pain Recent femoral fracture status post ORIF 12/12/2022, by Dr Linna Caprice.  -CT left Femur: Prior left hip replacement.  No hardware complicating feature. No acute bony abnormality. Irregular ill-defined fluid collection and gas seen anterior to the left hip replacement within the quadriceps muscle extending inferiorly into the mid thigh region. This could reflect postoperative hematoma although infection/developing abscess cannot be excluded. -Hemoglobin stable at 10. --9 resume Lovenox dvt prophylaxis.  -she has been declining eliquis at her facility.  -Schedule tylenol.. continue with PRN oxycodone. Flexeril prn.  -Evaluated by ortho, no acute intervention required.  -Continue with PT  Urinary tract infection: Patient with leukocytosis, pyuria Follow urine culture: E coli, 100K. Pan sensitive.  Blood culture. No growth to date.  Continue with ceftriaxone  Left Knee pain, edema;  Tylenol, voltaren.   Near syncope;  BP stable. Hb stable.  Suspect related to pain, vasovagal.  Needs to check BP while working with PT>  Will hold on  Lasix today.   Hernia of abdominal cavity: Seen on CT scan, outpatient follow-up -  Anasarca; LE edema;  Doppler negative for DVT  She has received IV lasix time two doses.  Started ensure, improve nutrition.  Monitor. Hold lasix today, due to near syncope episode while working with OT>   Pancreatic mass: Stable diffuse biliary ductal dilatation, with common bile duct measuring 21 mm. Known previously, need outpatient follow-up  Hyponatremia: chronic, prior sodium 125---129 She was on sodium tablet last admission.   Continue with sodium tablet BID.  Monitor on lasix.  Monitor..  Sodium fluctuates.   Constipation, fecal impaction: Per nursing staff, patient has had multiples BM since yesterday.   Continue with bowel regimen.    Seizure disorder: Continue with phenytoin.   DVT prophylaxis;  she has been refusing eliquis at rehab,    Estimated body mass index is 22.44 kg/m as calculated from the following:   Height as of this encounter: 5\' 7"  (1.702 m).   Weight as of this encounter: 65 kg.   DVT prophylaxis: SCD Code Status: DNR Family Communication: sister at bedside Disposition Plan:  Status is: Inpatient Remains inpatient appropriate because: management of pain, UTI    Consultants:  Ortho  Procedures:  Doppler  Antimicrobials:    Subjective: She is alert, she had what appear to be near syncope episode while working with OT>  She on my evaluation is alert, complaints of left hip pain, asking for pain medications.   Objective: Vitals:   12/28/22 1230 12/28/22 2100 12/29/22 0554 12/29/22 1308  BP: 131/67 129/72 (!) 148/64 (!) 149/67  Pulse: 88  86 72 82  Resp:  17 18 17   Temp: 98.1 F (36.7 C) 97.9 F (36.6 C) 97.9 F (36.6 C) (!) 97.5 F (36.4 C)  TempSrc: Oral Oral Oral Oral  SpO2: 99% 97% 98% 100%  Weight:      Height:        Intake/Output Summary (Last 24 hours) at 12/29/2022 1442 Last data filed at 12/29/2022 9811 Gross per 24 hour   Intake 238 ml  Output 2250 ml  Net -2012 ml   Filed Weights   12/26/22 1141  Weight: 65 kg    Examination:  General exam: NAD Respiratory system: CTA Cardiovascular system: S 1, S 2 RRR Gastrointestinal system: BS present, soft nt Central nervous system: Alert Extremities: plus 2 edema  Data Reviewed: I have personally reviewed following labs and imaging studies  CBC: Recent Labs  Lab 12/26/22 1339 12/26/22 1923 12/27/22 0653 12/28/22 0804 12/29/22 0531  WBC 11.4* 12.6* 9.6 7.6 6.6  NEUTROABS 8.6*  --   --   --   --   HGB 10.5* 10.0* 11.2* 9.8* 9.4*  HCT 31.4* 30.7* 35.5* 31.4* 29.2*  MCV 90.5 93.0 95.4 96.3 94.5  PLT 765* 571* 588* 516* 425*   Basic Metabolic Panel: Recent Labs  Lab 12/26/22 1339 12/26/22 1923 12/27/22 0653 12/27/22 0934 12/28/22 0804 12/29/22 0531  NA 127*  --  128* 126* 128* 126*  K 3.6  --  3.8 4.1 3.8 3.6  CL 98  --  97* 96* 97* 95*  CO2 21*  --  22 21* 23 24  GLUCOSE 133*  --  71 148* 128* 156*  BUN 7*  --  8 9 11 11   CREATININE 0.49 0.53 0.48 0.54 0.47 0.52  CALCIUM 7.7*  --  8.0* 7.7* 7.7* 7.7*   GFR: Estimated Creatinine Clearance: 45.5 mL/min (by C-G formula based on SCr of 0.52 mg/dL). Liver Function Tests: Recent Labs  Lab 12/26/22 1339  AST 15  ALT 13  ALKPHOS 114  BILITOT 0.5  PROT 5.1*  ALBUMIN 2.5*   Recent Labs  Lab 12/26/22 1339  LIPASE 55*   No results for input(s): "AMMONIA" in the last 168 hours. Coagulation Profile: Recent Labs  Lab 12/27/22 0653  INR 1.1   Cardiac Enzymes: No results for input(s): "CKTOTAL", "CKMB", "CKMBINDEX", "TROPONINI" in the last 168 hours. BNP (last 3 results) No results for input(s): "PROBNP" in the last 8760 hours. HbA1C: No results for input(s): "HGBA1C" in the last 72 hours. CBG: Recent Labs  Lab 12/28/22 1132 12/28/22 1602 12/28/22 2102 12/29/22 0754 12/29/22 1133  GLUCAP 218* 154* 144* 145* 184*   Lipid Profile: No results for input(s): "CHOL", "HDL",  "LDLCALC", "TRIG", "CHOLHDL", "LDLDIRECT" in the last 72 hours. Thyroid Function Tests: No results for input(s): "TSH", "T4TOTAL", "FREET4", "T3FREE", "THYROIDAB" in the last 72 hours. Anemia Panel: Recent Labs    12/28/22 0805  VITAMINB12 1,197*   Sepsis Labs: No results for input(s): "PROCALCITON", "LATICACIDVEN" in the last 168 hours.  Recent Results (from the past 240 hour(s))  Urine Culture     Status: Abnormal   Collection Time: 12/26/22  4:21 PM   Specimen: Urine, Clean Catch  Result Value Ref Range Status   Specimen Description   Final    URINE, CLEAN CATCH Performed at [facility], [address] [place], [place] [address]    Special Requests   Final    NONE Performed at [facility], [address] [place], [place]  [address]    Culture >=100,000 COLONIES/mL ESCHERICHIA COLI (A)  Final   Report Status 12/28/2022 FINAL  Final   Organism ID, Bacteria ESCHERICHIA COLI (A)  Final      Susceptibility   Escherichia coli - MIC*    AMPICILLIN 8 SENSITIVE Sensitive     CEFAZOLIN <=4 SENSITIVE Sensitive     CEFEPIME <=0.12 SENSITIVE Sensitive     CEFTRIAXONE <=0.25 SENSITIVE Sensitive     CIPROFLOXACIN <=0.25 SENSITIVE Sensitive     GENTAMICIN <=1 SENSITIVE Sensitive     IMIPENEM <=0.25 SENSITIVE Sensitive     NITROFURANTOIN <=16 SENSITIVE Sensitive     TRIMETH/SULFA <=20 SENSITIVE Sensitive     AMPICILLIN/SULBACTAM <=2 SENSITIVE Sensitive     PIP/TAZO <=4 SENSITIVE Sensitive     * >=100,000 COLONIES/mL ESCHERICHIA COLI  Culture, blood (Routine X 2) w Reflex to ID Panel     Status: None (Preliminary result)   Collection Time: 12/26/22 11:12 PM   Specimen: BLOOD  Result Value Ref Range Status   Specimen Description   Final    BLOOD BLOOD LEFT ARM Performed at [facility], [address], [place], [place] [address]    Special Requests   Final    BOTTLES DRAWN AEROBIC AND ANAEROBIC Blood Culture adequate  volume Performed at [facility], [address], [place], [place] [address]    Culture   Final    NO GROWTH 1 DAY Performed at [facility], [address], [place], [place] [address]    Report Status PENDING  Incomplete  Culture, blood (Routine X 2) w Reflex to ID Panel     Status: None (Preliminary result)   Collection Time: 12/26/22 11:12 PM   Specimen: BLOOD  Result Value Ref Range Status   Specimen Description   Final    BLOOD BLOOD LEFT HAND Performed at [facility], [address] [place], [place] [address]    Special Requests   Final    BOTTLES DRAWN AEROBIC AND ANAEROBIC Blood Culture adequate volume Performed at [facility], [address] [place], [place] [address]    Culture   Final    NO GROWTH 1 DAY Performed at [facility], [address], [place], [place] [address]    Report Status PENDING  Incomplete         Radiology Studies: No results found.      Scheduled Meds:  acetaminophen  500 mg Oral TID   cholecalciferol  5,000 Units Oral Daily   enoxaparin (LOVENOX) injection  40 mg Subcutaneous Q24H   feeding supplement  237 mL Oral BID BM   insulin aspart  0-15 Units Subcutaneous TID WC   insulin aspart  0-5 Units Subcutaneous QHS   insulin glargine-yfgn  13 Units Subcutaneous QHS   levothyroxine  75 mcg Oral QAC breakfast   pantoprazole  40 mg Oral Daily   phenytoin  300 mg Oral QHS   polyethylene glycol  17 g Oral BID   polyvinyl alcohol  2 drop Both Eyes TID   primidone  150 mg Oral QHS   primidone  250 mg Oral Daily   senna-docusate  2 tablet Oral BID   sodium chloride flush  3 mL Intravenous Q12H   sodium chloride  2 g Oral BID WC   thiamine  250 mg Oral Daily   verapamil  180 mg Oral Daily   Continuous Infusions:  cefTRIAXone (ROCEPHIN)  IV 2 g (12/29/22  1335)     LOS: 3 days    Time spent: 35 minutes    Amariz Flamenco A Cadell Gabrielson, MD [facility]   If  7PM-7AM, please contact night-coverage [email or web address]  12/29/2022, 2:42 PM

## 2022-12-29 NOTE — Evaluation (Signed)
Physical Therapy Evaluation Patient Details Name: [name] MRN: [id number] DOB: 1929-08-14 Today's Date: 12/29/2022  History of Present Illness  89 yo female re-admitted from rehab at [facility] where she was recovering from a left Anterior approach THA 12/12/22.  Due to UTI as well as new onset left anterior thigh and knee pain after being turned in bed at her skilled nursing facility. Hx of R THA, breast cancer, anemia, esential tremor, osteoporosis, gait abnormality, anxiety, asthma, chronic pain, Sz, DM  Clinical Impression  Pt admitted with above diagnosis.  Pt currently with functional limitations due to the deficits listed below (see PT Problem List). Pt will benefit from acute skilled PT to increase their independence and safety with mobility to allow discharge.     The patient did  participate in  mobility, requiring total assistance to mobilize to sitting and then to return to supine. Patient sat for ~ 10 minutes, appeared  to be pale after standing in /stedy x 1. Assisted back to supine. Patient  crying out reporting L leg pain, Patient  soon laughing with efforts to soothe and redirect patient.  Patient will benefit from continued inpatient follow up therapy, <3 hours/day      If plan is discharge home, recommend the following: Two people to help with walking and/or transfers;Two people to help with bathing/dressing/bathroom;Help with stairs or ramp for entrance;Assist for transportation;Direct supervision/assist for financial management;Assistance with cooking/housework   Can travel by private vehicle   No    Equipment Recommendations None recommended by PT  Recommendations for Other Services       Functional Status Assessment Patient has had a recent decline in their functional status and/or demonstrates limited ability to make significant improvements in function in a reasonable and predictable amount of time     Precautions / Restrictions Precautions Precautions:  Fall Precaution Comments: Mon Vitals Restrictions Weight Bearing Restrictions: No LLE Weight Bearing: Weight bearing as tolerated      Mobility  Bed Mobility               General bed mobility comments: total assist to slide patient around using bed pads .  total to return to supine with bed pads and trunk and leg support.    Transfers Overall transfer level: Needs assistance   Transfers: Sit to/from Stand Sit to Stand: +2 physical assistance, +2 safety/equipment (+ 3 support)           General transfer comment: total to rise  and stand x ~ 15 secs, patient  became pale and reported dizziness. Transfer via Lift Equipment: Stedy  Ambulation/Gait                  Stairs            Wheelchair Mobility     Tilt Bed    Modified Rankin (Stroke Patients Only)       Balance Overall balance assessment: Needs assistance Sitting-balance support: Bilateral upper extremity supported Sitting balance-Leahy Scale: Poor Sitting balance - Comments: gradually able to be min support for  afew minutes  then max when fatigued Postural control: Posterior lean   Standing balance-Leahy Scale: Zero                               Pertinent Vitals/Pain Pain Assessment Faces Pain Scale: Hurts worst Pain Location: L hip/knee with movement Pain Descriptors / Indicators: Grimacing, Guarding, Crying, Other (Comment), Moaning Pain Intervention(s):  Monitored during session, Limited activity within patient's tolerance, Premedicated before session, Repositioned    Home Living Family/patient expects to be discharged to:: Skilled nursing facility                 Home Equipment: Shower seat - built in;Rollator (4 wheels)      Prior Function Prior Level of Function : Needs assist             Mobility Comments: Patient reports that she has not been able to stand or take steps since her surgery on 12/12/2022 secondary to pain.        Extremity/Trunk Assessment   Upper Extremity Assessment Upper Extremity Assessment: Generalized weakness (3+/5 to shoulders, elbows, and hands.  Left hand still recovering from a past fall with wrist fracture and patient reports also fracturing her MP joint pointing to the D2.)    Lower Extremity Assessment Lower Extremity Assessment: RLE deficits/detail;LLE deficits/detail RLE Deficits / Details: able to flex hip and knee a little, active DF/PF noted edema about th  hip and thigh LLE Deficits / Details: required max support to bend the knee, activ e dF/PF, noted edema entire leg    Cervical / Trunk Assessment Cervical / Trunk Assessment: Kyphotic;Other exceptions Cervical / Trunk Exceptions: head tends to lean to the left  Communication   Communication Communication: No apparent difficulties  Cognition Arousal: Alert Behavior During Therapy: Anxious, WFL for tasks assessed/performed, Lability Overall Cognitive Status: No family/caregiver present to determine baseline cognitive functioning Area of Impairment: Attention                                        General Comments      Exercises     Assessment/Plan    PT Assessment Patient needs continued PT services  PT Problem List Decreased strength;Decreased range of motion;Decreased activity tolerance;Decreased balance;Decreased mobility;Decreased knowledge of use of DME;Pain       PT Treatment Interventions DME instruction;Balance training;Functional mobility training;Therapeutic activities;Therapeutic exercise;Patient/family education    PT Goals (Current goals can be found in the Care Plan section)  Acute Rehab PT Goals Patient Stated Goal: less pain PT Goal Formulation: With patient Time For Goal Achievement: 01/12/23 Potential to Achieve Goals: Fair    Frequency Min 1X/week     Co-evaluation PT/OT/SLP Co-Evaluation/Treatment: Yes Reason for Co-Treatment: For patient/therapist  safety;Complexity of the patient's impairments (multi-system involvement);To address functional/ADL transfers PT goals addressed during session: Mobility/safety with mobility OT goals addressed during session: ADL's and self-care;Strengthening/ROM       AM-PAC PT "6 Clicks" Mobility  Outcome Measure Help needed turning from your back to your side while in a flat bed without using bedrails?: Total Help needed moving from lying on your back to sitting on the side of a flat bed without using bedrails?: Total Help needed moving to and from a bed to a chair (including a wheelchair)?: Total Help needed standing up from a chair using your arms (e.g., wheelchair or bedside chair)?: Total Help needed to walk in hospital room?: Total Help needed climbing 3-5 steps with a railing? : Total 6 Click Score: 6    End of Session Equipment Utilized During Treatment: Gait belt Activity Tolerance: Patient limited by pain Patient left: in bed;with call bell/phone within reach;with bed alarm set Nurse Communication: Mobility status;Need for lift equipment PT Visit Diagnosis: History of falling (Z91.81);Other abnormalities of gait and mobility (R26.89);Pain;Difficulty  in walking, not elsewhere classified (R26.2);Muscle weakness (generalized) (M62.81) Pain - Right/Left: Left Pain - part of body: Hip    Time: 8315-1761 PT Time Calculation (min) (ACUTE ONLY): 31 min   Charges:   PT Evaluation $PT Eval Low Complexity: 1 Low   PT General Charges $$ ACUTE PT VISIT: 1 Visit         Blanchard Kelch PT Acute Rehabilitation Services Office [phone] Weekend pager-[phone]   Rada Hay 12/29/2022, 10:59 AM

## 2022-12-30 DIAGNOSIS — K5900 Constipation, unspecified: Secondary | ICD-10-CM

## 2022-12-30 DIAGNOSIS — R52 Pain, unspecified: Secondary | ICD-10-CM | POA: Diagnosis not present

## 2022-12-30 DIAGNOSIS — K469 Unspecified abdominal hernia without obstruction or gangrene: Secondary | ICD-10-CM

## 2022-12-30 LAB — BASIC METABOLIC PANEL
Anion gap: 8 (ref 5–15)
BUN: 9 mg/dL (ref 8–23)
CO2: 23 mmol/L (ref 22–32)
Calcium: 7.8 mg/dL — ABNORMAL LOW (ref 8.9–10.3)
Chloride: 97 mmol/L — ABNORMAL LOW (ref 98–111)
Creatinine, Ser: 0.49 mg/dL (ref 0.44–1.00)
GFR, Estimated: >60 mL/min (ref >60)
Glucose, Bld: 173 mg/dL — ABNORMAL HIGH (ref 70–99)
Potassium: 3.8 mmol/L (ref 3.5–5.1)
Sodium: 128 mmol/L — ABNORMAL LOW (ref 135–145)

## 2022-12-30 LAB — GLUCOSE, CAPILLARY
Glucose-Capillary: 157 mg/dL — ABNORMAL HIGH (ref 70–99)
Glucose-Capillary: 105 mg/dL — ABNORMAL HIGH (ref 70–99)
Glucose-Capillary: 58 mg/dL — ABNORMAL LOW (ref 70–99)
Glucose-Capillary: 81 mg/dL (ref 70–99)
Glucose-Capillary: 69 mg/dL — ABNORMAL LOW (ref 70–99)

## 2022-12-30 LAB — CBC
HCT: 30 % — ABNORMAL LOW (ref 36.0–46.0)
Hemoglobin: 9.3 g/dL — ABNORMAL LOW (ref 12.0–15.0)
MCH: 30.6 pg (ref 26.0–34.0)
MCHC: 31 g/dL (ref 30.0–36.0)
MCV: 98.7 fL (ref 80.0–100.0)
Platelets: 303 10*3/uL (ref 150–400)
RBC: 3.04 MIL/uL — ABNORMAL LOW (ref 3.87–5.11)
RDW: 15.6 % — ABNORMAL HIGH (ref 11.5–15.5)
WBC: 6.6 10*3/uL (ref 4.0–10.5)
nRBC: 0 % (ref 0.0–0.2)

## 2022-12-30 NOTE — TOC Progression Note (Signed)
Transition of Care [facility]) - Progression Note    Patient Details  Name: [name] MRN: [id number] Date of Birth: August 20, 1929  [facility]) CM/SW Contact  Otelia Santee, LCSW Phone Number: 12/30/2022, 3:28 PM  Clinical Narrative:    Attempted to meet with pt to review bed offers however, pt was sleeping and could not be woken. Spoke w/ pt's niece via t/c to review bed offers. Pt's niece is to discuss with pt and pt's sister prior to making decision.  TOC will follow for bed choice.    Expected Discharge Plan: Skilled Nursing Facility Barriers to Discharge: SNF Pending bed offer  Expected Discharge Plan and Services In-house Referral: Clinical Social Work Discharge Planning Services: NA Post Acute Care Choice: Skilled Nursing Facility Living arrangements for the past 2 months: Single Family Home                 DME Arranged: N/A DME Agency: NA                   Social Determinants of Health (SDOH) Interventions SDOH Screenings   Food Insecurity: No Food Insecurity (12/26/2022)  Housing: Low Risk  (12/26/2022)  Transportation Needs: No Transportation Needs (12/26/2022)  Utilities: Not At Risk (12/26/2022)  Depression (PHQ2-9): Low Risk  (09/18/2020)  Social Connections: Unknown (09/16/2021)   Received from [facility]  Tobacco Use: Low Risk  (12/26/2022)    Readmission Risk Interventions    12/28/2022    3:43 PM 12/15/2022   11:12 AM 10/27/2022   12:30 PM  Readmission Risk Prevention Plan  Transportation Screening Complete Complete Complete  PCP or Specialist Appt within 3-5 Days Complete  Complete  HRI or Home Care Consult Complete  Complete  Social Work Consult for Recovery Care Planning/Counseling Complete  Complete  Palliative Care Screening Not Applicable    Medication Review Oceanographer) Complete Complete Complete  PCP or Specialist appointment within 3-5 days of discharge  Complete   HRI or Home Care Consult  Complete    SW Recovery Care/Counseling Consult  Complete   Palliative Care Screening  Not Applicable   Skilled Nursing Facility  Complete

## 2022-12-30 NOTE — Plan of Care (Signed)
  Problem: Education: Goal: Knowledge of the prescribed therapeutic regimen will improve Outcome: Progressing Goal: Understanding of discharge needs will improve Outcome: Progressing Goal: Individualized Educational Video(s) Outcome: Progressing   Problem: Activity: Goal: Ability to avoid complications of mobility impairment will improve Outcome: Progressing Goal: Ability to tolerate increased activity will improve Outcome: Progressing   Problem: Clinical Measurements: Goal: Postoperative complications will be avoided or minimized Outcome: Progressing   Problem: Pain Management: Goal: Pain level will decrease with appropriate interventions Outcome: Progressing   Problem: Skin Integrity: Goal: Will show signs of wound healing Outcome: Progressing   Problem: Education: Goal: Knowledge of General Education information will improve Description: Including pain rating scale, medication(s)/side effects and non-pharmacologic comfort measures Outcome: Progressing   Problem: Health Behavior/Discharge Planning: Goal: Ability to manage health-related needs will improve Outcome: Progressing   Problem: Clinical Measurements: Goal: Ability to maintain clinical measurements within normal limits will improve Outcome: Progressing Goal: Will remain free from infection Outcome: Progressing Goal: Diagnostic test results will improve Outcome: Progressing Goal: Respiratory complications will improve Outcome: Progressing Goal: Cardiovascular complication will be avoided Outcome: Progressing   Problem: Activity: Goal: Risk for activity intolerance will decrease Outcome: Progressing   Problem: Nutrition: Goal: Adequate nutrition will be maintained Outcome: Progressing   Problem: Coping: Goal: Level of anxiety will decrease Outcome: Progressing   Problem: Elimination: Goal: Will not experience complications related to bowel motility Outcome: Progressing Goal: Will not experience  complications related to urinary retention Outcome: Progressing   Problem: Pain Managment: Goal: General experience of comfort will improve Outcome: Progressing   Problem: Safety: Goal: Ability to remain free from injury will improve Outcome: Progressing   Problem: Skin Integrity: Goal: Risk for impaired skin integrity will decrease Outcome: Progressing   Problem: Education: Goal: Ability to describe self-care measures that may prevent or decrease complications (Diabetes Survival Skills Education) will improve Outcome: Progressing Goal: Individualized Educational Video(s) Outcome: Progressing   Problem: Coping: Goal: Ability to adjust to condition or change in health will improve Outcome: Progressing   Problem: Fluid Volume: Goal: Ability to maintain a balanced intake and output will improve Outcome: Progressing   Problem: Health Behavior/Discharge Planning: Goal: Ability to identify and utilize available resources and services will improve Outcome: Progressing Goal: Ability to manage health-related needs will improve Outcome: Progressing   Problem: Metabolic: Goal: Ability to maintain appropriate glucose levels will improve Outcome: Progressing   Problem: Nutritional: Goal: Maintenance of adequate nutrition will improve Outcome: Progressing Goal: Progress toward achieving an optimal weight will improve Outcome: Progressing   Problem: Skin Integrity: Goal: Risk for impaired skin integrity will decrease Outcome: Progressing   Problem: Tissue Perfusion: Goal: Adequacy of tissue perfusion will improve Outcome: Progressing

## 2022-12-30 NOTE — Progress Notes (Signed)
Progress Note    [name]   BJY:[id number]  DOB: 08-14-1929  DOA: 12/26/2022     4 PCP: Verlon Au, MD  Initial CC: weakness  Hospital Course: Ms. Bench is a 89 yo female with PMH significant for left hip fracture status post ORIF 12/12/2022 who has had worsening weakness and deconditioning since surgery. She was discharged to SNF and upon repositioning in the bed prior to admission she developed worsened pain in her left hip.  She was therefore referred to the hospital for evaluation. CT of the left lower extremity was obtained on admission which was negative for hardware surgical complications.  She was also evaluated by orthopedic surgery given CT findings.  There was not felt to be underlying infectious complications.  She was recommended for continuing with PT as able.  Interval History:  No events overnight; patient moaning in bed with "pain all over". She could not fully elaborate but certainly does have worsened pain in LLE especially with passive ROM in bed to the LLE.   Assessment and Plan:  Left leg pain Recent femoral fracture status post ORIF 12/12/2022, by Dr Linna Caprice.  -CT left Femur: "Prior left hip replacement.  No hardware complicating feature. No acute bony abnormality. Irregular ill-defined fluid collection and gas seen anterior to the left hip replacement within the quadriceps muscle extending inferiorly into the mid thigh region. This could reflect postoperative hematoma although infection/developing abscess cannot be excluded." -Appreciate orthopedic surgery evaluation.  No concern for postop complications at this time - Continue PT/OT   Acute cystitis  Patient with leukocytosis, pyuria Follow urine culture: E coli, 100K. Pan sensitive.  Blood culture. No growth to date.  - complete 5 day course of Rocephin on 8/28   Left Knee pain, edema Tylenol, voltaren   Near syncope BP stable. Hb stable Suspect related to pain, vasovagal   Hernia of  abdominal cavity Seen on CT scan, outpatient follow-up   LE edema Doppler negative for DVT  She has received IV lasix time two doses Started ensure, improve nutrition -Lasix on hold due to near syncope with therapy   Pancreatic mass Stable diffuse biliary ductal dilatation, with common bile duct measuring 21 mm. Known previously, need outpatient follow-up   Hyponatremia: chronic, prior sodium 125---129 She was on sodium tablet last admission Continue with sodium tablet BID Sodium fluctuates   Constipation, fecal impaction: Per nursing staff, patient has had multiples BM since yesterday Continue with bowel regimen   Seizure disorder: Continue with phenytoin.    DVT prophylaxis;  she has been refusing eliquis at rehab,   Old records reviewed in assessment of this patient  Antimicrobials: Rocephin 12/26/2022 >> 12/30/2026  DVT prophylaxis:  enoxaparin (LOVENOX) injection 40 mg Start: 12/28/22 1000 Place and maintain sequential compression device Start: 12/27/22 1324 Place TED hose Start: 12/27/22 1324 SCDs Start: 12/26/22 1631   Code Status:   Code Status: DNR  Mobility Assessment (Last 72 Hours)     Mobility Assessment     Row Name 12/29/22 2300 12/29/22 1057 12/29/22 1026 12/28/22 2100 12/28/22 1100   Does patient have an order for bedrest or is patient medically unstable No - Continue assessment -- -- No - Continue assessment No - Continue assessment   What is the highest level of mobility based on the progressive mobility assessment? Level 2 (Chairfast) - Balance while sitting on edge of bed and cannot stand Level 2 (Chairfast) - Balance while sitting on edge of bed and cannot stand Level  2 (Chairfast) - Balance while sitting on edge of bed and cannot stand Level 1 (Bedfast) - Unable to balance while sitting on edge of bed Level 1 (Bedfast) - Unable to balance while sitting on edge of bed   Is the above level different from baseline mobility prior to current illness?  Yes - Recommend PT order -- -- Yes - Recommend PT order Yes - Recommend PT order    Row Name 12/27/22 2100           Does patient have an order for bedrest or is patient medically unstable No - Continue assessment       What is the highest level of mobility based on the progressive mobility assessment? Level 1 (Bedfast) - Unable to balance while sitting on edge of bed       Is the above level different from baseline mobility prior to current illness? Yes - Recommend PT order                Barriers to discharge: none Disposition Plan:  SNF Status is: Inpt  Objective: Blood pressure (!) 144/69, pulse 78, temperature 98.6 F (37 C), resp. rate 17, height 5\' 7"  (1.702 m), weight 65 kg, SpO2 100%.  Examination:  Physical Exam Constitutional:      Comments: Elderly woman laying in bed in no distress but uncomfortable and moaning  HENT:     Head: Normocephalic and atraumatic.     Mouth/Throat:     Mouth: Mucous membranes are moist.  Eyes:     Extraocular Movements: Extraocular movements intact.  Cardiovascular:     Rate and Rhythm: Normal rate and regular rhythm.  Pulmonary:     Effort: Pulmonary effort is normal.     Breath sounds: Normal breath sounds.  Abdominal:     General: Bowel sounds are normal. There is no distension.     Palpations: Abdomen is soft.     Tenderness: There is no abdominal tenderness.  Musculoskeletal:        General: Swelling (B/L LE edema in hips) present.     Cervical back: Normal range of motion and neck supple.     Comments: Left surgical dressing in place, compartments soft  Skin:    General: Skin is warm and dry.  Neurological:     Mental Status: She is disoriented.      Consultants:  Orthopedic surgery   Procedures:    Data Reviewed: Results for orders placed or performed during the hospital encounter of 12/26/22 (from the past 24 hour(s))  Glucose, capillary     Status: Abnormal   Collection Time: 12/29/22  4:44 PM  Result  Value Ref Range   Glucose-Capillary 244 (H) 70 - 99 mg/dL  Glucose, capillary     Status: Abnormal   Collection Time: 12/29/22  8:55 PM  Result Value Ref Range   Glucose-Capillary 136 (H) 70 - 99 mg/dL  CBC     Status: Abnormal   Collection Time: 12/30/22  5:25 AM  Result Value Ref Range   WBC 6.6 4.0 - 10.5 K/uL   RBC 3.04 (L) 3.87 - 5.11 MIL/uL   Hemoglobin 9.3 (L) 12.0 - 15.0 g/dL   HCT 40.9 (L) 81.1 - 91.4 %   MCV 98.7 80.0 - 100.0 fL   MCH 30.6 26.0 - 34.0 pg   MCHC 31.0 30.0 - 36.0 g/dL   RDW 78.2 (H) 95.6 - 21.3 %   Platelets 303 150 - 400 K/uL   nRBC 0.0 0.0 -  0.2 %  Basic metabolic panel     Status: Abnormal   Collection Time: 12/30/22  5:25 AM  Result Value Ref Range   Sodium 128 (L) 135 - 145 mmol/L   Potassium 3.8 3.5 - 5.1 mmol/L   Chloride 97 (L) 98 - 111 mmol/L   CO2 23 22 - 32 mmol/L   Glucose, Bld 173 (H) 70 - 99 mg/dL   BUN 9 8 - 23 mg/dL   Creatinine, Ser 5.28 0.44 - 1.00 mg/dL   Calcium 7.8 (L) 8.9 - 10.3 mg/dL   GFR, Estimated >41 >32 mL/min   Anion gap 8 5 - 15  Glucose, capillary     Status: Abnormal   Collection Time: 12/30/22  7:34 AM  Result Value Ref Range   Glucose-Capillary 157 (H) 70 - 99 mg/dL  Glucose, capillary     Status: Abnormal   Collection Time: 12/30/22 12:48 PM  Result Value Ref Range   Glucose-Capillary 105 (H) 70 - 99 mg/dL   Comment 1 Notify RN    Comment 2 Document in Chart    *Note: Due to a large number of results and/or encounters for the requested time period, some results have not been displayed. A complete set of results can be found in Results Review.    I have reviewed pertinent nursing notes, vitals, labs, and images as necessary. I have ordered labwork to follow up on as indicated.  I have reviewed the last notes from staff over past 24 hours. I have discussed patient's care plan and test results with nursing staff, CM/SW, and other staff as appropriate.  Time spent: Greater than 50% of the 55 minute visit was  spent in counseling/coordination of care for the patient as laid out in the A&P.   LOS: 4 days   Lewie Chamber, MD [facility] 12/30/2022, 4:14 PM

## 2022-12-30 NOTE — Hospital Course (Signed)
[name] is a 89 yo female with PMH significant for left hip fracture status post ORIF 12/12/2022 who has had worsening weakness and deconditioning since surgery. She was discharged to SNF and upon repositioning in the bed prior to admission she developed worsened pain in her left hip.  She was therefore referred to the hospital for evaluation. CT of the left lower extremity was obtained on admission which was negative for hardware surgical complications.  She was also evaluated by orthopedic surgery given CT findings.  There was not felt to be underlying infectious complications.  She was recommended for continuing with PT as able.

## 2022-12-31 DIAGNOSIS — R52 Pain, unspecified: Secondary | ICD-10-CM | POA: Diagnosis not present

## 2022-12-31 DIAGNOSIS — K5900 Constipation, unspecified: Secondary | ICD-10-CM | POA: Diagnosis not present

## 2022-12-31 LAB — GLUCOSE, CAPILLARY
Glucose-Capillary: 82 mg/dL (ref 70–99)
Glucose-Capillary: 211 mg/dL — ABNORMAL HIGH (ref 70–99)
Glucose-Capillary: 208 mg/dL — ABNORMAL HIGH (ref 70–99)
Glucose-Capillary: 194 mg/dL — ABNORMAL HIGH (ref 70–99)

## 2022-12-31 MED ORDER — LOPERAMIDE HCL 2 MG PO CAPS
2.0000 mg | ORAL_CAPSULE | ORAL | Status: DC | PRN
  Administered 2022-12-31 – 2023-01-01 (×2): 2 mg via ORAL
  Filled 2022-12-31 (×2): qty 1

## 2022-12-31 MED ORDER — ZINC OXIDE 40 % EX OINT
TOPICAL_OINTMENT | CUTANEOUS | Status: DC | PRN
  Filled 2022-12-31: qty 57

## 2022-12-31 MED ORDER — POLYETHYLENE GLYCOL 3350 17 G PO PACK: 17.0000 g | PACK | Freq: Every day | ORAL | Status: DC | PRN

## 2022-12-31 MED ORDER — SENNOSIDES-DOCUSATE SODIUM 8.6-50 MG PO TABS: 2.0000 | ORAL_TABLET | Freq: Two times a day (BID) | ORAL | Status: DC | PRN

## 2022-12-31 MED ORDER — OXYCODONE HCL 5 MG PO TABS
2.5000 mg | ORAL_TABLET | ORAL | Status: DC | PRN
  Administered 2023-01-01 – 2023-01-02 (×4): 2.5 mg via ORAL
  Filled 2022-12-31 (×4): qty 1

## 2022-12-31 NOTE — Progress Notes (Signed)
Progress Note    [name]   OZD:[id number]  DOB: 1929-11-26  DOA: 12/26/2022     5 PCP: Verlon Au, MD  Initial CC: weakness  Hospital Course: [name] is a 89 yo female with PMH significant for left hip fracture status post ORIF 12/12/2022 who has had worsening weakness and deconditioning since surgery. She was discharged to SNF and upon repositioning in the bed prior to admission she developed worsened pain in her left hip.  She was therefore referred to the hospital for evaluation. CT of the left lower extremity was obtained on admission which was negative for hardware surgical complications.  She was also evaluated by orthopedic surgery given CT findings.  There was not felt to be underlying infectious complications.  She was recommended for continuing with PT as able.  Interval History:  No events overnight. More comfortable appearing today.  Still unable to do much with therapy; today she partially stood with max assist 2.  Awaiting placement at this time.   Assessment and Plan:  Left leg pain Recent femoral fracture status post ORIF 12/12/2022, by Dr Linna Caprice.  -CT left Femur: "Prior left hip replacement.  No hardware complicating feature. No acute bony abnormality. Irregular ill-defined fluid collection and gas seen anterior to the left hip replacement within the quadriceps muscle extending inferiorly into the mid thigh region. This could reflect postoperative hematoma although infection/developing abscess cannot be excluded." -Appreciate orthopedic surgery evaluation.  No concern for postop complications at this time - Continue PT/OT   Acute cystitis  Patient with leukocytosis, pyuria Follow urine culture: E coli, 100K. Pan sensitive.  Blood culture. No growth to date.  - completed 5 day course of Rocephin on 8/28   Left Knee pain, edema Tylenol, voltaren   Near syncope BP stable. Hb stable Suspect related to pain, vasovagal   Hernia of abdominal  cavity Seen on CT scan, outpatient follow-up   LE edema Doppler negative for DVT  She has received IV lasix time two doses Started ensure, improve nutrition -Lasix on hold due to near syncope with therapy   Pancreatic mass Stable diffuse biliary ductal dilatation, with common bile duct measuring 21 mm. Known previously, need outpatient follow-up   Hyponatremia: chronic, prior sodium 125---129 She was on sodium tablet last admission Continue with sodium tablet BID Sodium fluctuates   Constipation, fecal impaction: Per nursing staff, patient has had multiples BM since yesterday Continue with bowel regimen   Seizure disorder: Continue with phenytoin.    DVT prophylaxis;  she has been refusing eliquis at rehab,   Old records reviewed in assessment of this patient  Antimicrobials: Rocephin 12/26/2022 >> 12/30/2026  DVT prophylaxis:  enoxaparin (LOVENOX) injection 40 mg Start: 12/28/22 1000 Place and maintain sequential compression device Start: 12/27/22 1324 Place TED hose Start: 12/27/22 1324 SCDs Start: 12/26/22 1631   Code Status:   Code Status: DNR  Mobility Assessment (Last 72 Hours)     Mobility Assessment     Row Name 12/31/22 1525 12/31/22 1130 12/31/22 0800 12/30/22 2100 12/30/22 0745   Does patient have an order for bedrest or is patient medically unstable -- -- No - Continue assessment No - Continue assessment No - Continue assessment   What is the highest level of mobility based on the progressive mobility assessment? Level 2 (Chairfast) - Balance while sitting on edge of bed and cannot stand Level 2 (Chairfast) - Balance while sitting on edge of bed and cannot stand Level 2 (Chairfast) - Balance  while sitting on edge of bed and cannot stand Level 2 (Chairfast) - Balance while sitting on edge of bed and cannot stand Level 2 (Chairfast) - Balance while sitting on edge of bed and cannot stand   Is the above level different from baseline mobility prior to current  illness? -- -- Yes - Recommend PT order Yes - Recommend PT order Yes - Recommend PT order    Row Name 12/29/22 2300 12/29/22 1057 12/29/22 1026 12/28/22 2100     Does patient have an order for bedrest or is patient medically unstable No - Continue assessment -- -- No - Continue assessment    What is the highest level of mobility based on the progressive mobility assessment? Level 2 (Chairfast) - Balance while sitting on edge of bed and cannot stand Level 2 (Chairfast) - Balance while sitting on edge of bed and cannot stand Level 2 (Chairfast) - Balance while sitting on edge of bed and cannot stand Level 1 (Bedfast) - Unable to balance while sitting on edge of bed    Is the above level different from baseline mobility prior to current illness? Yes - Recommend PT order -- -- Yes - Recommend PT order             Barriers to discharge: none Disposition Plan:  SNF; medically stable  Status is: Inpt  Objective: Blood pressure 134/74, pulse 94, temperature 98 F (36.7 C), temperature source Oral, resp. rate 15, height 5\' 7"  (1.702 m), weight 65 kg, SpO2 98%.  Examination:  Physical Exam Constitutional:      Comments: Elderly woman laying in bed in no distress; more comfortable appearing today   HENT:     Head: Normocephalic and atraumatic.     Mouth/Throat:     Mouth: Mucous membranes are moist.  Eyes:     Extraocular Movements: Extraocular movements intact.  Cardiovascular:     Rate and Rhythm: Normal rate and regular rhythm.  Pulmonary:     Effort: Pulmonary effort is normal.     Breath sounds: Normal breath sounds.  Abdominal:     General: Bowel sounds are normal. There is no distension.     Palpations: Abdomen is soft.     Tenderness: There is no abdominal tenderness.  Musculoskeletal:        General: Swelling (B/L LE edema in hips) present.     Cervical back: Normal range of motion and neck supple.     Comments: Left surgical dressing in place, compartments soft  Skin:     General: Skin is warm and dry.  Neurological:     Mental Status: She is disoriented.      Consultants:  Orthopedic surgery   Procedures:    Data Reviewed: Results for orders placed or performed during the hospital encounter of 12/26/22 (from the past 24 hour(s))  Glucose, capillary     Status: Abnormal   Collection Time: 12/30/22  5:07 PM  Result Value Ref Range   Glucose-Capillary 58 (L) 70 - 99 mg/dL  Glucose, capillary     Status: None   Collection Time: 12/30/22  5:30 PM  Result Value Ref Range   Glucose-Capillary 81 70 - 99 mg/dL  Glucose, capillary     Status: Abnormal   Collection Time: 12/30/22  8:28 PM  Result Value Ref Range   Glucose-Capillary 69 (L) 70 - 99 mg/dL  Glucose, capillary     Status: None   Collection Time: 12/31/22  7:27 AM  Result Value Ref Range  Glucose-Capillary 82 70 - 99 mg/dL  Glucose, capillary     Status: Abnormal   Collection Time: 12/31/22 12:10 PM  Result Value Ref Range   Glucose-Capillary 211 (H) 70 - 99 mg/dL   *Note: Due to a large number of results and/or encounters for the requested time period, some results have not been displayed. A complete set of results can be found in Results Review.    I have reviewed pertinent nursing notes, vitals, labs, and images as necessary. I have ordered labwork to follow up on as indicated.  I have reviewed the last notes from staff over past 24 hours. I have discussed patient's care plan and test results with nursing staff, CM/SW, and other staff as appropriate.    LOS: 5 days   Lewie Chamber, MD [facility] 12/31/2022, 4:20 PM

## 2022-12-31 NOTE — Progress Notes (Signed)
Physical Therapy Treatment Patient Details Name: [name] MRN: [id number] DOB: May 06, 1929 Today's Date: 12/31/2022   History of Present Illness 89 yr old female re-admitted from rehab at [facility] where she was recovering from a left Anterior approach THA 12/12/22.  Due to UTI as well as new onset left anterior thigh and knee pain. Hx of R THA, breast cancer, anemia, esential tremor, osteoporosis, gait abnormality, anxiety, asthma, chronic pain, Sz, DM    PT Comments  The patient tolerated well, demonstrated improved   balance while sitting on bed edge. Did partially stand x 2 with max of 2 to power up, did not clear buttocks, Patient  holding onto STEDY bar.The current medical regimen is effective;  continue present plan and medications. PT for mobility.    If plan is discharge home, recommend the following: Two people to help with walking and/or transfers;Two people to help with bathing/dressing/bathroom;Help with stairs or ramp for entrance;Assist for transportation;Direct supervision/assist for financial management;Assistance with cooking/housework   Can travel by private vehicle     No  Equipment Recommendations  None recommended by PT    Recommendations for Other Services       Precautions / Restrictions Precautions Precautions: Fall Restrictions LLE Weight Bearing: Weight bearing as tolerated     Mobility  Bed Mobility Overal bed mobility: Needs Assistance Bed Mobility: Supine to Sit, Sit to Supine Rolling: +2 for physical assistance, Total assist     Sit to supine: Total assist, HOB elevated, +2 for safety/equipment   General bed mobility comments: total assist to slide patient around using bed pads .  total to return to supine with bed pads and trunk and leg support.    Transfers Overall transfer level: Needs assistance   Transfers: Sit to/from Stand Sit to Stand: +2 physical assistance, +2 safety/equipment           General transfer comment: required 2  person total assist to perform two sit to stand transfers, with pt only able to partially clear her buttocks off the bed during each attempt; she required max cues for transfer technique, including B LE positioning on foot plate, pushing with B LE and pulling on stedy support bar Transfer via Lift Equipment: Stedy  Ambulation/Gait                   Stairs             Wheelchair Mobility     Tilt Bed    Modified Rankin (Stroke Patients Only)       Balance Overall balance assessment: Needs assistance Sitting-balance support: Bilateral upper extremity supported Sitting balance-Leahy Scale: Poor Sitting balance - Comments: gradually able to be min support for  afew minutes  then max when fatigued Postural control: Posterior lean   Standing balance-Leahy Scale: Zero                              Cognition Arousal: Alert Behavior During Therapy: WFL for tasks assessed/performed Overall Cognitive Status: No family/caregiver present to determine baseline cognitive functioning                                          Exercises General Exercises - Lower Extremity Ankle Circles/Pumps: AROM, Both, 10 reps Long Arc Quad: AROM, AAROM, Both, 10 reps, Seated Hip Flexion/Marching: AAROM, Both, 10 reps,  Seated    General Comments        Pertinent Vitals/Pain Pain Assessment Faces Pain Scale: Hurts worst Pain Location: L knee Pain Descriptors / Indicators: Grimacing, Guarding, Crying, Other (Comment), Moaning Pain Intervention(s): Limited activity within patient's tolerance    Home Living                          Prior Function            PT Goals (current goals can now be found in the care plan section) Progress towards PT goals: Progressing toward goals    Frequency    Min 1X/week      PT Plan      Co-evaluation PT/OT/SLP Co-Evaluation/Treatment: Yes Reason for Co-Treatment: For patient/therapist  safety;Complexity of the patient's impairments (multi-system involvement);To address functional/ADL transfers PT goals addressed during session: Mobility/safety with mobility OT goals addressed during session: ADL's and self-care;Strengthening/ROM      AM-PAC PT "6 Clicks" Mobility   Outcome Measure  Help needed turning from your back to your side while in a flat bed without using bedrails?: Total Help needed moving from lying on your back to sitting on the side of a flat bed without using bedrails?: Total Help needed moving to and from a bed to a chair (including a wheelchair)?: Total Help needed standing up from a chair using your arms (e.g., wheelchair or bedside chair)?: Total Help needed to walk in hospital room?: Total Help needed climbing 3-5 steps with a railing? : Total 6 Click Score: 6    End of Session Equipment Utilized During Treatment: Gait belt Activity Tolerance: Patient tolerated treatment well;Patient limited by pain Patient left: in bed;with call bell/phone within reach;with bed alarm set Nurse Communication: Mobility status;Need for lift equipment PT Visit Diagnosis: History of falling (Z91.81);Other abnormalities of gait and mobility (R26.89);Pain;Difficulty in walking, not elsewhere classified (R26.2);Muscle weakness (generalized) (M62.81) Pain - Right/Left: Left Pain - part of body: Hip     Time: 1308-6578 PT Time Calculation (min) (ACUTE ONLY): 26 min  Charges:    $Therapeutic Activity: 8-22 mins PT General Charges $$ ACUTE PT VISIT: 1 Visit                     Blanchard Kelch PT Acute Rehabilitation Services Office [phone] Weekend [phone]    Rada Hay 12/31/2022, 3:26 PM

## 2022-12-31 NOTE — Progress Notes (Signed)
Occupational Therapy Treatment Patient Details Name: [name] MRN: [id number] DOB: July 06, 1929 Today's Date: 12/31/2022   History of present illness 89 yr old female re-admitted from rehab at [facility] where she was recovering from a left Anterior approach THA 12/12/22.  Due to UTI as well as new onset left anterior thigh and knee pain. Hx of R THA, breast cancer, anemia, esential tremor, osteoporosis, gait abnormality, anxiety, asthma, chronic pain, Sz, DM   OT comments  Pt continues to require 2 person assist for progressive activity, including bed mobility and sit to stand attempts using a Corene Cornea. Pt was only able to partially clear her buttocks off the bed during each of 2 stand attempts; she required max cues for transfer technique, including B LE positioning on foot plate, pushing with B LE and pulling on stedy support bar. She initially presented with poor sitting balance EOB, however progressed to maintaining sitting for brief instances at CGA-min assist; her total sitting tolerance EOB was ~10 minutes. Continue OT plan of care to facilitate improved ADL performance and to decrease the risk for further weakness and deconditioning. Patient will benefit from continued inpatient follow up therapy, <3 hours/day.       If plan is discharge home, recommend the following:  Two people to help with walking and/or transfers;Direct supervision/assist for medications management;Assist for transportation;Assistance with cooking/housework;Help with stairs or ramp for entrance;A lot of help with bathing/dressing/bathroom   Equipment Recommendations  Other (comment) (defer to next level of care)    Recommendations for Other Services      Precautions / Restrictions Precautions Precautions: Fall Restrictions Weight Bearing Restrictions: Yes LLE Weight Bearing: Weight bearing as tolerated       Mobility Bed Mobility Overal bed mobility: Needs Assistance Bed Mobility: Supine to Sit, Sit to  Supine Rolling: +2 for physical assistance, Total assist   Supine to sit: Total assist, +2 for safety/equipment, HOB elevated     General bed mobility comments: total assist to slide patient around using bed pads .  total to return to supine with bed pads and trunk and leg support.    Transfers Overall transfer level: Needs assistance   Transfers: Sit to/from Stand Sit to Stand: +2 physical assistance, +2 safety/equipment           General transfer comment: required 2 person total assist to perform two sit to stand transfers, with pt only able to partially clear her buttocks off the bed during each attempt; she required max cues for transfer technique, including B LE positioning on foot plate, pushing with B LE and pulling on stedy support bar Transfer via Lift Equipment: Stedy       ADL either performed or assessed with clinical judgement   ADL Overall ADL's : Needs assistance/impaired Eating/Feeding: Set up;Bed level Eating/Feeding Details (indicate cue type and reason): based on clinical judgement Grooming: Minimal assistance;Bed level Grooming Details (indicate cue type and reason): simulated in semi-fowler's Upper Body Bathing: Bed level;Moderate assistance Upper Body Bathing Details (indicate cue type and reason): based on clinical judgement Lower Body Bathing: Maximal assistance;Bed level Lower Body Bathing Details (indicate cue type and reason): based on clinical judgement Upper Body Dressing : Moderate assistance;Bed level Upper Body Dressing Details (indicate cue type and reason): simulated Lower Body Dressing: Total assistance;Bed level Lower Body Dressing Details (indicate cue type and reason): based on clinical judgement     Toileting- Clothing Manipulation and Hygiene: Total assistance;Bed level  Cognition Arousal: Alert Behavior During Therapy: WFL for tasks assessed/performed            General Comments: able to follow simple  commands consistently                   Pertinent Vitals/ Pain       Pain Assessment Pain Assessment: 0-10 Pain Score: 8  Pain Location: L knee Pain Intervention(s): Limited activity within patient's tolerance, Monitored during session, Repositioned, Other (comment) (Pt received pain medication prior to session)         Frequency  Min 1X/week        Progress Toward Goals  OT Goals(current goals can now be found in the care plan section)     Acute Rehab OT Goals Patient Stated Goal: to learn how to walk again OT Goal Formulation: With patient Time For Goal Achievement: 01/12/23 Potential to Achieve Goals: Fair  Plan      Co-evaluation    PT/OT/SLP Co-Evaluation/Treatment: Yes Reason for Co-Treatment: For patient/therapist safety;Complexity of the patient's impairments (multi-system involvement);To address functional/ADL transfers PT goals addressed during session: Mobility/safety with mobility OT goals addressed during session: ADL's and self-care;Strengthening/ROM      AM-PAC OT "6 Clicks" Daily Activity     Outcome Measure   Help from another person eating meals?: A Little Help from another person taking care of personal grooming?: A Little Help from another person toileting, which includes using toliet, bedpan, or urinal?: Total Help from another person bathing (including washing, rinsing, drying)?: A Lot Help from another person to put on and taking off regular upper body clothing?: A Lot Help from another person to put on and taking off regular lower body clothing?: Total 6 Click Score: 12    End of Session Equipment Utilized During Treatment: Gait belt  OT Visit Diagnosis: Muscle weakness (generalized) (M62.81);History of falling (Z91.81);Pain Pain - Right/Left: Left Pain - part of body: Leg   Activity Tolerance Patient limited by pain;Patient limited by fatigue   Patient Left in bed;with bed alarm set;with call bell/phone within reach;with  SCD's reapplied   Nurse Communication Other (comment) (pt cleared for participation in therapy)        Time: 1610-9604 OT Time Calculation (min): 28 min  Charges: OT General Charges $OT Visit: 1 Visit OT Treatments $Therapeutic Activity: 8-22 mins      Reuben Likes, OTR/L 12/31/2022, 11:32 AM

## 2022-12-31 NOTE — Care Management Important Message (Signed)
Important Message  Patient Details IM Letter given. Name: [name] Comp MRN: [id number] Date of Birth: 11/07/1929   Medicare Important Message Given:  Yes     Caren Macadam 12/31/2022, 11:56 AM

## 2022-12-31 NOTE — TOC Progression Note (Signed)
Transition of Care [facility]) - Progression Note    Patient Details  Name: [name] MRN: [id number] Date of Birth: 04/14/1930  Transition of Care [facility] CM/SW Contact  Adrian Prows, RN Phone Number: 12/31/2022, 12:21 PM  Clinical Narrative:    [name] w/ pt's niece [name] [phone]); she says that she, pt, and sister will discuss bed offers when she comes to visit today; awaiting bed choice.   Expected Discharge Plan: Skilled Nursing Facility Barriers to Discharge: SNF Pending bed offer  Expected Discharge Plan and Services In-house Referral: Clinical Social Work Discharge Planning Services: NA Post Acute Care Choice: Skilled Nursing Facility Living arrangements for the past 2 months: Single Family Home                 DME Arranged: N/A DME Agency: NA                   Social Determinants of Health (SDOH) Interventions SDOH Screenings   Food Insecurity: No Food Insecurity (12/26/2022)  Housing: Low Risk  (12/26/2022)  Transportation Needs: No Transportation Needs (12/26/2022)  Utilities: Not At Risk (12/26/2022)  Depression (PHQ2-9): Low Risk  (09/18/2020)  Social Connections: Unknown (09/16/2021)   Received from [facility], [facility]  Tobacco Use: Low Risk  (12/26/2022)    Readmission Risk Interventions    12/28/2022    3:43 PM 12/15/2022   11:12 AM 10/27/2022   12:30 PM  Readmission Risk Prevention Plan  Transportation Screening Complete Complete Complete  PCP or Specialist Appt within 3-5 Days Complete  Complete  HRI or Home Care Consult Complete  Complete  Social Work Consult for Recovery Care Planning/Counseling Complete  Complete  Palliative Care Screening Not Applicable    Medication Review Oceanographer) Complete Complete Complete  PCP or Specialist appointment within 3-5 days of discharge  Complete   HRI or Home Care Consult  Complete   SW Recovery Care/Counseling Consult  Complete   Palliative Care Screening  Not Applicable    Skilled Nursing Facility  Complete

## 2022-12-31 NOTE — Plan of Care (Signed)
  Problem: Education: Goal: Knowledge of the prescribed therapeutic regimen will improve Outcome: Progressing Goal: Understanding of discharge needs will improve Outcome: Progressing Goal: Individualized Educational Video(s) Outcome: Progressing   Problem: Activity: Goal: Ability to avoid complications of mobility impairment will improve Outcome: Progressing Goal: Ability to tolerate increased activity will improve Outcome: Progressing   Problem: Clinical Measurements: Goal: Postoperative complications will be avoided or minimized Outcome: Progressing   Problem: Pain Management: Goal: Pain level will decrease with appropriate interventions Outcome: Progressing   Problem: Skin Integrity: Goal: Will show signs of wound healing Outcome: Progressing   Problem: Education: Goal: Knowledge of General Education information will improve Description: Including pain rating scale, medication(s)/side effects and non-pharmacologic comfort measures Outcome: Progressing   Problem: Health Behavior/Discharge Planning: Goal: Ability to manage health-related needs will improve Outcome: Progressing   Problem: Clinical Measurements: Goal: Ability to maintain clinical measurements within normal limits will improve Outcome: Progressing Goal: Will remain free from infection Outcome: Progressing Goal: Diagnostic test results will improve Outcome: Progressing Goal: Respiratory complications will improve Outcome: Progressing Goal: Cardiovascular complication will be avoided Outcome: Progressing   Problem: Activity: Goal: Risk for activity intolerance will decrease Outcome: Progressing   Problem: Nutrition: Goal: Adequate nutrition will be maintained Outcome: Progressing   Problem: Coping: Goal: Level of anxiety will decrease Outcome: Progressing   Problem: Elimination: Goal: Will not experience complications related to bowel motility Outcome: Progressing Goal: Will not experience  complications related to urinary retention Outcome: Progressing   Problem: Pain Managment: Goal: General experience of comfort will improve Outcome: Progressing   Problem: Safety: Goal: Ability to remain free from injury will improve Outcome: Progressing   Problem: Skin Integrity: Goal: Risk for impaired skin integrity will decrease Outcome: Progressing   Problem: Education: Goal: Ability to describe self-care measures that may prevent or decrease complications (Diabetes Survival Skills Education) will improve Outcome: Progressing Goal: Individualized Educational Video(s) Outcome: Progressing   Problem: Coping: Goal: Ability to adjust to condition or change in health will improve Outcome: Progressing   Problem: Fluid Volume: Goal: Ability to maintain a balanced intake and output will improve Outcome: Progressing   Problem: Health Behavior/Discharge Planning: Goal: Ability to identify and utilize available resources and services will improve Outcome: Progressing Goal: Ability to manage health-related needs will improve Outcome: Progressing   Problem: Metabolic: Goal: Ability to maintain appropriate glucose levels will improve Outcome: Progressing   Problem: Nutritional: Goal: Maintenance of adequate nutrition will improve Outcome: Progressing Goal: Progress toward achieving an optimal weight will improve Outcome: Progressing   Problem: Skin Integrity: Goal: Risk for impaired skin integrity will decrease Outcome: Progressing   Problem: Tissue Perfusion: Goal: Adequacy of tissue perfusion will improve Outcome: Progressing

## 2023-01-01 DIAGNOSIS — N3000 Acute cystitis without hematuria: Secondary | ICD-10-CM

## 2023-01-01 DIAGNOSIS — L89152 Pressure ulcer of sacral region, stage 2: Secondary | ICD-10-CM | POA: Insufficient documentation

## 2023-01-01 DIAGNOSIS — S72002A Fracture of unspecified part of neck of left femur, initial encounter for closed fracture: Secondary | ICD-10-CM | POA: Diagnosis not present

## 2023-01-01 DIAGNOSIS — R5381 Other malaise: Secondary | ICD-10-CM

## 2023-01-01 DIAGNOSIS — L8990 Pressure ulcer of unspecified site, unspecified stage: Secondary | ICD-10-CM | POA: Insufficient documentation

## 2023-01-01 LAB — CBC WITH DIFFERENTIAL/PLATELET
Abs Immature Granulocytes: 0.03 10*3/uL (ref 0.00–0.07)
Basophils Absolute: 0 10*3/uL (ref 0.0–0.1)
Basophils Relative: 1 %
Eosinophils Absolute: 0.3 10*3/uL (ref 0.0–0.5)
Eosinophils Relative: 5 %
HCT: 31.1 % — ABNORMAL LOW (ref 36.0–46.0)
Hemoglobin: 9.8 g/dL — ABNORMAL LOW (ref 12.0–15.0)
Immature Granulocytes: 1 %
Lymphocytes Relative: 17 %
Lymphs Abs: 0.9 10*3/uL (ref 0.7–4.0)
MCH: 30.2 pg (ref 26.0–34.0)
MCHC: 31.5 g/dL (ref 30.0–36.0)
MCV: 96 fL (ref 80.0–100.0)
Monocytes Absolute: 0.4 10*3/uL (ref 0.1–1.0)
Monocytes Relative: 7 %
Neutro Abs: 3.7 10*3/uL (ref 1.7–7.7)
Neutrophils Relative %: 69 %
Platelets: 453 10*3/uL — ABNORMAL HIGH (ref 150–400)
RBC: 3.24 MIL/uL — ABNORMAL LOW (ref 3.87–5.11)
RDW: 15.8 % — ABNORMAL HIGH (ref 11.5–15.5)
WBC: 5.3 10*3/uL (ref 4.0–10.5)
nRBC: 0 % (ref 0.0–0.2)

## 2023-01-01 LAB — GLUCOSE, CAPILLARY
Glucose-Capillary: 74 mg/dL (ref 70–99)
Glucose-Capillary: 196 mg/dL — ABNORMAL HIGH (ref 70–99)
Glucose-Capillary: 172 mg/dL — ABNORMAL HIGH (ref 70–99)
Glucose-Capillary: 193 mg/dL — ABNORMAL HIGH (ref 70–99)

## 2023-01-01 LAB — BASIC METABOLIC PANEL
Anion gap: 7 (ref 5–15)
BUN: 6 mg/dL — ABNORMAL LOW (ref 8–23)
CO2: 26 mmol/L (ref 22–32)
Calcium: 7.7 mg/dL — ABNORMAL LOW (ref 8.9–10.3)
Chloride: 100 mmol/L (ref 98–111)
Creatinine, Ser: 0.51 mg/dL (ref 0.44–1.00)
GFR, Estimated: >60 mL/min (ref >60)
Glucose, Bld: 243 mg/dL — ABNORMAL HIGH (ref 70–99)
Potassium: 3.2 mmol/L — ABNORMAL LOW (ref 3.5–5.1)
Sodium: 133 mmol/L — ABNORMAL LOW (ref 135–145)

## 2023-01-01 LAB — MAGNESIUM: Magnesium: 1.6 mg/dL — ABNORMAL LOW (ref 1.7–2.4)

## 2023-01-01 LAB — CULTURE, BLOOD (ROUTINE X 2)
Culture: NO GROWTH
Culture: NO GROWTH
Special Requests: ADEQUATE
Special Requests: ADEQUATE

## 2023-01-01 MED ORDER — CHOLESTYRAMINE LIGHT 4 G PO PACK
4.0000 g | PACK | Freq: Two times a day (BID) | ORAL | Status: DC
  Administered 2023-01-01 – 2023-01-03 (×5): 4 g via ORAL
  Filled 2023-01-01 (×5): qty 1

## 2023-01-01 MED ORDER — MAGNESIUM SULFATE 2 GM/50ML IV SOLN
2.0000 g | Freq: Once | INTRAVENOUS | Status: AC
  Administered 2023-01-01: 2 g via INTRAVENOUS
  Filled 2023-01-01: qty 50

## 2023-01-01 MED ORDER — POTASSIUM CHLORIDE CRYS ER 20 MEQ PO TBCR
40.0000 meq | EXTENDED_RELEASE_TABLET | Freq: Once | ORAL | Status: AC
  Administered 2023-01-01: 40 meq via ORAL
  Filled 2023-01-01: qty 2

## 2023-01-01 MED ORDER — HYDROXYZINE HCL 25 MG PO TABS
25.0000 mg | ORAL_TABLET | Freq: Three times a day (TID) | ORAL | Status: DC | PRN
  Administered 2023-01-01 – 2023-01-03 (×3): 25 mg via ORAL
  Filled 2023-01-01 (×3): qty 1

## 2023-01-01 NOTE — Progress Notes (Signed)
Progress Note    [name]   HKV:[id number]  DOB: 25-Oct-1929  DOA: 12/26/2022     6 PCP: Verlon Au, MD  Initial CC: weakness  Hospital Course: [name] is a 89 yo female with PMH significant for left hip fracture status post ORIF 12/12/2022 who has had worsening weakness and deconditioning since surgery. She was discharged to SNF and upon repositioning in the bed prior to admission she developed worsened pain in her left hip.  She was therefore referred to the hospital for evaluation. CT of the left lower extremity was obtained on admission which was negative for hardware surgical complications.  She was also evaluated by orthopedic surgery given CT findings.  There was not felt to be underlying infectious complications.  She was recommended for continuing with PT as able.  Interval History:  No events overnight. Continues to be comfortable.  Less pain in LLE on exam today as well. Updated niece, [name], on phone.  Awaiting d/c to SNF still.   Assessment and Plan:  Left femoral fracture s/p ORIF 12/12/2022, by Dr Linna Caprice Physical deconditioning  -CT left Femur: "Prior left hip replacement.  No hardware complicating feature. No acute bony abnormality. Irregular ill-defined fluid collection and gas seen anterior to the left hip replacement within the quadriceps muscle extending inferiorly into the mid thigh region. This could reflect postoperative hematoma although infection/developing abscess cannot be excluded." -Appreciate orthopedic surgery evaluation.  No concern for postop complications at this time - Continue PT/OT   Acute cystitis - resolved  Patient with leukocytosis, pyuria Follow urine culture: E coli, 100K. Pan sensitive.  Blood culture. No growth to date.  - completed 5 day course of Rocephin on 8/28   Left Knee pain, edema Tylenol, voltaren   Near syncope BP stable. Hb stable Suspect related to pain, vasovagal   Hernia of abdominal cavity Seen on CT  scan, outpatient follow-up   LE edema Doppler negative for DVT  She has received IV lasix time two doses Started ensure, improve nutrition -Lasix on hold due to near syncope with therapy   Pancreatic mass Stable diffuse biliary ductal dilatation, with common bile duct measuring 21 mm. Known previously, need outpatient follow-up   Hyponatremia: chronic, prior sodium 125---129 She was on sodium tablet last admission Continue with sodium tablet BID Sodium fluctuates   Constipation, fecal impaction: Per nursing staff, patient has had multiples BM since yesterday Continue with bowel regimen   Seizure disorder: Continue with phenytoin.    DVT prophylaxis;  she has been refusing eliquis at rehab,   Old records reviewed in assessment of this patient  Antimicrobials: Rocephin 12/26/2022 >> 12/30/2026  DVT prophylaxis:  enoxaparin (LOVENOX) injection 40 mg Start: 12/28/22 1000 Place and maintain sequential compression device Start: 12/27/22 1324 Place TED hose Start: 12/27/22 1324 SCDs Start: 12/26/22 1631   Code Status:   Code Status: DNR  Mobility Assessment (Last 72 Hours)     Mobility Assessment     Row Name 01/01/23 0800 12/31/22 2143 12/31/22 1525 12/31/22 1130 12/31/22 0800   Does patient have an order for bedrest or is patient medically unstable No - Continue assessment No - Continue assessment -- -- No - Continue assessment   What is the highest level of mobility based on the progressive mobility assessment? Level 2 (Chairfast) - Balance while sitting on edge of bed and cannot stand Level 2 (Chairfast) - Balance while sitting on edge of bed and cannot stand Level 2 (Chairfast) - Balance while  sitting on edge of bed and cannot stand Level 2 (Chairfast) - Balance while sitting on edge of bed and cannot stand Level 2 (Chairfast) - Balance while sitting on edge of bed and cannot stand   Is the above level different from baseline mobility prior to current illness? Yes -  Recommend PT order Yes - Recommend PT order -- -- Yes - Recommend PT order    Row Name 12/30/22 2100 12/30/22 0745 12/29/22 2300       Does patient have an order for bedrest or is patient medically unstable No - Continue assessment No - Continue assessment No - Continue assessment     What is the highest level of mobility based on the progressive mobility assessment? Level 2 (Chairfast) - Balance while sitting on edge of bed and cannot stand Level 2 (Chairfast) - Balance while sitting on edge of bed and cannot stand Level 2 (Chairfast) - Balance while sitting on edge of bed and cannot stand     Is the above level different from baseline mobility prior to current illness? Yes - Recommend PT order Yes - Recommend PT order Yes - Recommend PT order              Barriers to discharge: none Disposition Plan:  SNF; medically stable  Status is: Inpt  Objective: Blood pressure (!) 150/71, pulse 71, temperature 98.1 F (36.7 C), resp. rate 15, height 5\' 7"  (1.702 m), weight 65 kg, SpO2 95%.  Examination:  Physical Exam Constitutional:      Comments: Elderly woman laying in bed in no distress; more comfortable appearing today   HENT:     Head: Normocephalic and atraumatic.     Mouth/Throat:     Mouth: Mucous membranes are moist.  Eyes:     Extraocular Movements: Extraocular movements intact.  Cardiovascular:     Rate and Rhythm: Normal rate and regular rhythm.  Pulmonary:     Effort: Pulmonary effort is normal.     Breath sounds: Normal breath sounds.  Abdominal:     General: Bowel sounds are normal. There is no distension.     Palpations: Abdomen is soft.     Tenderness: There is no abdominal tenderness.  Musculoskeletal:        General: Swelling (B/L LE edema in hips) present.     Cervical back: Normal range of motion and neck supple.     Comments: Left surgical dressing in place, compartments soft  Skin:    General: Skin is warm and dry.  Neurological:     Mental Status:  Mental status is at baseline.      Consultants:  Orthopedic surgery   Procedures:    Data Reviewed: Results for orders placed or performed during the hospital encounter of 12/26/22 (from the past 24 hour(s))  Glucose, capillary     Status: Abnormal   Collection Time: 12/31/22  5:26 PM  Result Value Ref Range   Glucose-Capillary 208 (H) 70 - 99 mg/dL  Glucose, capillary     Status: Abnormal   Collection Time: 12/31/22  8:37 PM  Result Value Ref Range   Glucose-Capillary 194 (H) 70 - 99 mg/dL  Glucose, capillary     Status: None   Collection Time: 01/01/23  8:08 AM  Result Value Ref Range   Glucose-Capillary 74 70 - 99 mg/dL  Basic metabolic panel     Status: Abnormal   Collection Time: 01/01/23 11:12 AM  Result Value Ref Range   Sodium 133 (L) 135 -  145 mmol/L   Potassium 3.2 (L) 3.5 - 5.1 mmol/L   Chloride 100 98 - 111 mmol/L   CO2 26 22 - 32 mmol/L   Glucose, Bld 243 (H) 70 - 99 mg/dL   BUN 6 (L) 8 - 23 mg/dL   Creatinine, Ser 2.13 0.44 - 1.00 mg/dL   Calcium 7.7 (L) 8.9 - 10.3 mg/dL   GFR, Estimated >08 >65 mL/min   Anion gap 7 5 - 15  CBC with Differential/Platelet     Status: Abnormal   Collection Time: 01/01/23 11:12 AM  Result Value Ref Range   WBC 5.3 4.0 - 10.5 K/uL   RBC 3.24 (L) 3.87 - 5.11 MIL/uL   Hemoglobin 9.8 (L) 12.0 - 15.0 g/dL   HCT 78.4 (L) 69.6 - 29.5 %   MCV 96.0 80.0 - 100.0 fL   MCH 30.2 26.0 - 34.0 pg   MCHC 31.5 30.0 - 36.0 g/dL   RDW 28.4 (H) 13.2 - 44.0 %   Platelets 453 (H) 150 - 400 K/uL   nRBC 0.0 0.0 - 0.2 %   Neutrophils Relative % 69 %   Neutro Abs 3.7 1.7 - 7.7 K/uL   Lymphocytes Relative 17 %   Lymphs Abs 0.9 0.7 - 4.0 K/uL   Monocytes Relative 7 %   Monocytes Absolute 0.4 0.1 - 1.0 K/uL   Eosinophils Relative 5 %   Eosinophils Absolute 0.3 0.0 - 0.5 K/uL   Basophils Relative 1 %   Basophils Absolute 0.0 0.0 - 0.1 K/uL   Immature Granulocytes 1 %   Abs Immature Granulocytes 0.03 0.00 - 0.07 K/uL  Magnesium     Status:  Abnormal   Collection Time: 01/01/23 11:12 AM  Result Value Ref Range   Magnesium 1.6 (L) 1.7 - 2.4 mg/dL  Glucose, capillary     Status: Abnormal   Collection Time: 01/01/23 12:03 PM  Result Value Ref Range   Glucose-Capillary 196 (H) 70 - 99 mg/dL   *Note: Due to a large number of results and/or encounters for the requested time period, some results have not been displayed. A complete set of results can be found in Results Review.    I have reviewed pertinent nursing notes, vitals, labs, and images as necessary. I have ordered labwork to follow up on as indicated.  I have reviewed the last notes from staff over past 24 hours. I have discussed patient's care plan and test results with nursing staff, CM/SW, and other staff as appropriate.    LOS: 6 days   Lewie Chamber, MD [facility] 01/01/2023, 3:15 PM

## 2023-01-01 NOTE — Plan of Care (Signed)
  Problem: Education: Goal: Knowledge of the prescribed therapeutic regimen will improve Outcome: Progressing Goal: Understanding of discharge needs will improve Outcome: Progressing Goal: Individualized Educational Video(s) Outcome: Progressing   Problem: Activity: Goal: Ability to avoid complications of mobility impairment will improve Outcome: Progressing Goal: Ability to tolerate increased activity will improve Outcome: Progressing   Problem: Clinical Measurements: Goal: Postoperative complications will be avoided or minimized Outcome: Progressing   Problem: Pain Management: Goal: Pain level will decrease with appropriate interventions Outcome: Progressing   Problem: Skin Integrity: Goal: Will show signs of wound healing Outcome: Progressing   Problem: Education: Goal: Knowledge of General Education information will improve Description: Including pain rating scale, medication(s)/side effects and non-pharmacologic comfort measures Outcome: Progressing   Problem: Health Behavior/Discharge Planning: Goal: Ability to manage health-related needs will improve Outcome: Progressing   Problem: Clinical Measurements: Goal: Ability to maintain clinical measurements within normal limits will improve Outcome: Progressing Goal: Will remain free from infection Outcome: Progressing Goal: Diagnostic test results will improve Outcome: Progressing Goal: Respiratory complications will improve Outcome: Progressing Goal: Cardiovascular complication will be avoided Outcome: Progressing   Problem: Activity: Goal: Risk for activity intolerance will decrease Outcome: Progressing   Problem: Nutrition: Goal: Adequate nutrition will be maintained Outcome: Progressing   Problem: Coping: Goal: Level of anxiety will decrease Outcome: Progressing   Problem: Elimination: Goal: Will not experience complications related to bowel motility Outcome: Progressing Goal: Will not experience  complications related to urinary retention Outcome: Progressing   Problem: Pain Managment: Goal: General experience of comfort will improve Outcome: Progressing   Problem: Safety: Goal: Ability to remain free from injury will improve Outcome: Progressing   Problem: Skin Integrity: Goal: Risk for impaired skin integrity will decrease Outcome: Progressing   Problem: Education: Goal: Ability to describe self-care measures that may prevent or decrease complications (Diabetes Survival Skills Education) will improve Outcome: Progressing Goal: Individualized Educational Video(s) Outcome: Progressing   Problem: Coping: Goal: Ability to adjust to condition or change in health will improve Outcome: Progressing   Problem: Fluid Volume: Goal: Ability to maintain a balanced intake and output will improve Outcome: Progressing   Problem: Health Behavior/Discharge Planning: Goal: Ability to identify and utilize available resources and services will improve Outcome: Progressing Goal: Ability to manage health-related needs will improve Outcome: Progressing   Problem: Metabolic: Goal: Ability to maintain appropriate glucose levels will improve Outcome: Progressing   Problem: Nutritional: Goal: Maintenance of adequate nutrition will improve Outcome: Progressing Goal: Progress toward achieving an optimal weight will improve Outcome: Progressing   Problem: Skin Integrity: Goal: Risk for impaired skin integrity will decrease Outcome: Progressing   Problem: Tissue Perfusion: Goal: Adequacy of tissue perfusion will improve Outcome: Progressing

## 2023-01-01 NOTE — TOC Progression Note (Addendum)
Transition of Care [facility]) - Progression Note    Patient Details  Name: [name] MRN: [id number] Date of Birth: [birth year]  [facility] CM/SW Contact  Adrian Prows, RN Phone Number: 01/01/2023, 9:56 AM  Clinical Narrative:    [name] w/ pt and minister Ree Kida in room; pt agrees to have niece [name] be placed on speaker phone for conversation; pt says she wants [facility]; explained bed offer declined by facility; [name] says she has someone from outside agency trying to get pt bed at facility; encouraged pt and niece to choose facility from list of bed offers previously provided; [name] says she wants the pt to choose facility; LVM for [name], Admissions Director at [facility]; awaiting return call.   -[phone]- call back by [name] at [facility]; she says facility unable to offer bed; she has also notified [facility] rep.  -1240- pt notified pt unable to offer bed; she was given a list of bed offers; awaiting choice.  Expected Discharge Plan: Skilled Nursing Facility Barriers to Discharge: SNF Pending bed offer  Expected Discharge Plan and Services In-house Referral: Clinical Social Work Discharge Planning Services: NA Post Acute Care Choice: Skilled Nursing Facility Living arrangements for the past 2 months: Single Family Home                 DME Arranged: N/A DME Agency: NA                   Social Determinants of Health (SDOH) Interventions SDOH Screenings   Food Insecurity: No Food Insecurity (12/26/2022)  Housing: Low Risk  (12/26/2022)  Transportation Needs: No Transportation Needs (12/26/2022)  Utilities: Not At Risk (12/26/2022)  Depression (PHQ2-9): Low Risk  (09/18/2020)  Social Connections: Unknown (09/16/2021)   Received from [facility]  Tobacco Use: Low Risk  (12/26/2022)    Readmission Risk Interventions    12/28/2022    3:43 PM 12/15/2022   11:12 AM 10/27/2022   12:30 PM  Readmission Risk Prevention Plan   Transportation Screening Complete Complete Complete  PCP or Specialist Appt within 3-5 Days Complete  Complete  HRI or Home Care Consult Complete  Complete  Social Work Consult for Recovery Care Planning/Counseling Complete  Complete  Palliative Care Screening Not Applicable    Medication Review Oceanographer) Complete Complete Complete  PCP or Specialist appointment within 3-5 days of discharge  Complete   HRI or Home Care Consult  Complete   SW Recovery Care/Counseling Consult  Complete   Palliative Care Screening  Not Applicable   Skilled Nursing Facility  Complete

## 2023-01-02 DIAGNOSIS — S72002A Fracture of unspecified part of neck of left femur, initial encounter for closed fracture: Secondary | ICD-10-CM | POA: Diagnosis not present

## 2023-01-02 LAB — GLUCOSE, CAPILLARY
Glucose-Capillary: 176 mg/dL — ABNORMAL HIGH (ref 70–99)
Glucose-Capillary: 165 mg/dL — ABNORMAL HIGH (ref 70–99)
Glucose-Capillary: 210 mg/dL — ABNORMAL HIGH (ref 70–99)
Glucose-Capillary: 217 mg/dL — ABNORMAL HIGH (ref 70–99)
Glucose-Capillary: 171 mg/dL — ABNORMAL HIGH (ref 70–99)

## 2023-01-02 NOTE — Plan of Care (Signed)
Patient total care incontinent of urine and stool prior skin concerns noted Problem: Education: Goal: Knowledge of the prescribed therapeutic regimen will improve Outcome: Not Progressing Goal: Understanding of discharge needs will improve Outcome: Not Progressing Goal: Individualized Educational Video(s) Outcome: Not Progressing   Problem: Activity: Goal: Ability to avoid complications of mobility impairment will improve Outcome: Not Progressing Goal: Ability to tolerate increased activity will improve Outcome: Not Progressing   Problem: Clinical Measurements: Goal: Postoperative complications will be avoided or minimized Outcome: Not Progressing   Problem: Pain Management: Goal: Pain level will decrease with appropriate interventions Outcome: Not Progressing   Problem: Skin Integrity: Goal: Will show signs of wound healing Outcome: Not Progressing   Problem: Education: Goal: Knowledge of General Education information will improve Description: Including pain rating scale, medication(s)/side effects and non-pharmacologic comfort measures Outcome: Not Progressing   Problem: Health Behavior/Discharge Planning: Goal: Ability to manage health-related needs will improve Outcome: Not Progressing   Problem: Clinical Measurements: Goal: Ability to maintain clinical measurements within normal limits will improve Outcome: Not Progressing Goal: Will remain free from infection Outcome: Not Progressing Goal: Diagnostic test results will improve Outcome: Not Progressing Goal: Respiratory complications will improve Outcome: Not Progressing Goal: Cardiovascular complication will be avoided Outcome: Not Progressing   Problem: Activity: Goal: Risk for activity intolerance will decrease Outcome: Not Progressing   Problem: Nutrition: Goal: Adequate nutrition will be maintained Outcome: Not Progressing   Problem: Coping: Goal: Level of anxiety will decrease Outcome: Not  Progressing   Problem: Elimination: Goal: Will not experience complications related to bowel motility Outcome: Not Progressing Goal: Will not experience complications related to urinary retention Outcome: Not Progressing   Problem: Pain Managment: Goal: General experience of comfort will improve Outcome: Not Progressing   Problem: Safety: Goal: Ability to remain free from injury will improve Outcome: Not Progressing   Problem: Skin Integrity: Goal: Risk for impaired skin integrity will decrease Outcome: Not Progressing   Problem: Education: Goal: Ability to describe self-care measures that Schult prevent or decrease complications (Diabetes Survival Skills Education) will improve Outcome: Not Progressing Goal: Individualized Educational Video(s) Outcome: Not Progressing   Problem: Coping: Goal: Ability to adjust to condition or change in health will improve Outcome: Not Progressing   Problem: Fluid Volume: Goal: Ability to maintain a balanced intake and output will improve Outcome: Not Progressing   Problem: Metabolic: Goal: Ability to maintain appropriate glucose levels will improve Outcome: Not Progressing   Problem: Nutritional: Goal: Maintenance of adequate nutrition will improve Outcome: Not Progressing Goal: Progress toward achieving an optimal weight will improve Outcome: Not Progressing   Problem: Skin Integrity: Goal: Risk for impaired skin integrity will decrease Outcome: Not Progressing   Problem: Tissue Perfusion: Goal: Adequacy of tissue perfusion will improve Outcome: Not Progressing

## 2023-01-02 NOTE — TOC Progression Note (Addendum)
Transition of Care [facility]) - Progression Note    Patient Details  Name: [name] MRN: [id number] Date of Birth: 06-16-1929  Transition [facility]) CM/SW Contact  Adrian Prows, RN Phone Number: 01/02/2023, 2:20 PM  Clinical Narrative:    [name] w/ pt in room; pt says she has selected [facility]; pt requested to go to a different floor from previous admission; explained this RN CM will notify facility of request; facility notified of pt acceptance via SNF hub; also LVM for Lawerance Cruel, admissions at facility; awaiting return call.  [phone]- call back by Lawerance Cruel at [facility]; she says pt can admit tomorrow; she would like for this RN CM to call her in am for RM assignment and call report #; Dr Frederick Peers notified via secure chat.  Expected Discharge Plan: Skilled Nursing Facility Barriers to Discharge: SNF Pending bed offer  Expected Discharge Plan and Services In-house Referral: Clinical Social Work Discharge Planning Services: NA Post Acute Care Choice: Skilled Nursing Facility Living arrangements for the past 2 months: Single Family Home                 DME Arranged: N/A DME Agency: NA                   Social Determinants of Health (SDOH) Interventions SDOH Screenings   Food Insecurity: No Food Insecurity (12/26/2022)  Housing: Low Risk  (12/26/2022)  Transportation Needs: No Transportation Needs (12/26/2022)  Utilities: Not At Risk (12/26/2022)  Depression (PHQ2-9): Low Risk  (09/18/2020)  Social Connections: Unknown (09/16/2021)   Received from [facility]  Tobacco Use: Low Risk  (12/26/2022)    Readmission Risk Interventions    12/28/2022    3:43 PM 12/15/2022   11:12 AM 10/27/2022   12:30 PM  Readmission Risk Prevention Plan  Transportation Screening Complete Complete Complete  PCP or Specialist Appt within 3-5 Days Complete  Complete  HRI or Home Care Consult Complete  Complete  Social Work Consult for Recovery Care Planning/Counseling  Complete  Complete  Palliative Care Screening Not Applicable    Medication Review Oceanographer) Complete Complete Complete  PCP or Specialist appointment within 3-5 days of discharge  Complete   HRI or Home Care Consult  Complete   SW Recovery Care/Counseling Consult  Complete   Palliative Care Screening  Not Applicable   Skilled Nursing Facility  Complete

## 2023-01-02 NOTE — Progress Notes (Signed)
Progress Note    [name]   MWU:[id number]  DOB: [birth year]  DOA: 12/26/2022     7 PCP: Yolanda Au, MD  Initial CC: weakness  Hospital Course: [name] is a 89 yo female with PMH significant for left hip fracture status post ORIF 12/12/2022 who has had worsening weakness and deconditioning since surgery. She was discharged to SNF and upon repositioning in the bed prior to admission she developed worsened pain in her left hip.  She was therefore referred to the hospital for evaluation. CT of the left lower extremity was obtained on admission which was negative for hardware surgical complications.  She was also evaluated by orthopedic surgery given CT findings.  There was not felt to be underlying infectious complications.  She was recommended for continuing with PT as able.  Interval History:  No events overnight. Continues to be comfortable.  Daughter present bedside this morning.  We discussed need for getting her to rehab for hope for further mobility improvement.  She continues to remain stable medically inpatient. Tentative plan for discharge to [facility] tomorrow.  Assessment and Plan:  Left femoral fracture s/p ORIF 12/12/2022, by Dr Linna Caprice Physical deconditioning  -CT left Femur: "Prior left hip replacement.  No hardware complicating feature. No acute bony abnormality. Irregular ill-defined fluid collection and gas seen anterior to the left hip replacement within the quadriceps muscle extending inferiorly into the mid thigh region. This could reflect postoperative hematoma although infection/developing abscess cannot be excluded." -Appreciate orthopedic surgery evaluation.  No concern for postop complications at this time - Continue PT/OT   Acute cystitis - resolved  Patient with leukocytosis, pyuria Follow urine culture: E coli, 100K. Pan sensitive.  Blood culture. No growth to date.  - completed 5 day course of Rocephin on 8/28   Left Knee pain, edema Tylenol,  voltaren   Near syncope BP stable. Hb stable Suspect related to pain, vasovagal   Hernia of abdominal cavity Seen on CT scan, outpatient follow-up   LE edema Doppler negative for DVT  She has received IV lasix time two doses Started ensure, improve nutrition -Lasix on hold due to near syncope with therapy   Pancreatic mass Stable diffuse biliary ductal dilatation, with common bile duct measuring 21 mm. Known previously, need outpatient follow-up   Hyponatremia: chronic, prior sodium 125---129 She was on sodium tablet last admission Continue with sodium tablet BID Sodium fluctuates   Constipation, fecal impaction: Per nursing staff, patient has had multiples BM since yesterday Continue with bowel regimen   Seizure disorder: Continue with phenytoin.    DVT prophylaxis;  she has been refusing eliquis at rehab,   Old records reviewed in assessment of this patient  Antimicrobials: Rocephin 12/26/2022 >> 12/30/2026  DVT prophylaxis:  enoxaparin (LOVENOX) injection 40 mg Start: 12/28/22 1000 Place and maintain sequential compression device Start: 12/27/22 1324 Place TED hose Start: 12/27/22 1324 SCDs Start: 12/26/22 1631   Code Status:   Code Status: DNR  Mobility Assessment (Last 72 Hours)     Mobility Assessment     Row Name 01/02/23 0840 01/01/23 2100 01/01/23 0800 12/31/22 2143 12/31/22 1525   Does patient have an order for bedrest or is patient medically unstable No - Continue assessment No - Continue assessment No - Continue assessment No - Continue assessment --   What is the highest level of mobility based on the progressive mobility assessment? Level 1 (Bedfast) - Unable to balance while sitting on edge of bed Level 2 (Chairfast) -  Balance while sitting on edge of bed and cannot stand Level 2 (Chairfast) - Balance while sitting on edge of bed and cannot stand Level 2 (Chairfast) - Balance while sitting on edge of bed and cannot stand Level 2 (Chairfast) - Balance  while sitting on edge of bed and cannot stand   Is the above level different from baseline mobility prior to current illness? Yes - Recommend PT order Yes - Recommend PT order Yes - Recommend PT order Yes - Recommend PT order --    Row Name 12/31/22 1130 12/31/22 0800 12/30/22 2100       Does patient have an order for bedrest or is patient medically unstable -- No - Continue assessment No - Continue assessment     What is the highest level of mobility based on the progressive mobility assessment? Level 2 (Chairfast) - Balance while sitting on edge of bed and cannot stand Level 2 (Chairfast) - Balance while sitting on edge of bed and cannot stand Level 2 (Chairfast) - Balance while sitting on edge of bed and cannot stand     Is the above level different from baseline mobility prior to current illness? -- Yes - Recommend PT order Yes - Recommend PT order              Barriers to discharge: none Disposition Plan:  SNF; medically stable  Status is: Inpt  Objective: Blood pressure 136/72, pulse 86, temperature 98.9 F (37.2 C), temperature source Oral, resp. rate 16, height 5\' 7"  (1.702 m), weight 65 kg, SpO2 97%.  Examination:  Physical Exam Constitutional:      Comments: Elderly woman laying in bed in no distress; more comfortable appearing today   HENT:     Head: Normocephalic and atraumatic.     Mouth/Throat:     Mouth: Mucous membranes are moist.  Eyes:     Extraocular Movements: Extraocular movements intact.  Cardiovascular:     Rate and Rhythm: Normal rate and regular rhythm.  Pulmonary:     Effort: Pulmonary effort is normal.     Breath sounds: Normal breath sounds.  Abdominal:     General: Bowel sounds are normal. There is no distension.     Palpations: Abdomen is soft.     Tenderness: There is no abdominal tenderness.  Musculoskeletal:        General: Swelling (B/L LE edema in hips) present.     Cervical back: Normal range of motion and neck supple.     Comments:  Left surgical dressing in place, compartments soft  Skin:    General: Skin is warm and dry.  Neurological:     Mental Status: Mental status is at baseline.      Consultants:  Orthopedic surgery   Procedures:    Data Reviewed: Results for orders placed or performed during the hospital encounter of 12/26/22 (from the past 24 hour(s))  Glucose, capillary     Status: Abnormal   Collection Time: 01/01/23  5:29 PM  Result Value Ref Range   Glucose-Capillary 172 (H) 70 - 99 mg/dL  Glucose, capillary     Status: Abnormal   Collection Time: 01/01/23  9:47 PM  Result Value Ref Range   Glucose-Capillary 193 (H) 70 - 99 mg/dL   Comment 1 Notify RN   Glucose, capillary     Status: Abnormal   Collection Time: 01/02/23  7:12 AM  Result Value Ref Range   Glucose-Capillary 176 (H) 70 - 99 mg/dL  Glucose, capillary  Status: Abnormal   Collection Time: 01/02/23 11:41 AM  Result Value Ref Range   Glucose-Capillary 165 (H) 70 - 99 mg/dL  Glucose, capillary     Status: Abnormal   Collection Time: 01/02/23  4:28 PM  Result Value Ref Range   Glucose-Capillary 210 (H) 70 - 99 mg/dL   *Note: Due to a large number of results and/or encounters for the requested time period, some results have not been displayed. A complete set of results can be found in Results Review.    I have reviewed pertinent nursing notes, vitals, labs, and images as necessary. I have ordered labwork to follow up on as indicated.  I have reviewed the last notes from staff over past 24 hours. I have discussed patient's care plan and test results with nursing staff, CM/SW, and other staff as appropriate.    LOS: 7 days   Lewie Chamber, MD [facility] 01/02/2023, 4:59 PM

## 2023-01-03 DIAGNOSIS — R5381 Other malaise: Secondary | ICD-10-CM

## 2023-01-03 DIAGNOSIS — S72002A Fracture of unspecified part of neck of left femur, initial encounter for closed fracture: Secondary | ICD-10-CM | POA: Diagnosis not present

## 2023-01-03 LAB — GLUCOSE, CAPILLARY
Glucose-Capillary: 96 mg/dL (ref 70–99)
Glucose-Capillary: 166 mg/dL — ABNORMAL HIGH (ref 70–99)

## 2023-01-03 MED ORDER — OXYCODONE HCL 5 MG PO TABS: 2.5000 mg | ORAL_TABLET | Freq: Four times a day (QID) | ORAL | 0 refills | Status: DC | PRN

## 2023-01-03 MED ORDER — SODIUM CHLORIDE 1 G PO TABS: 2.0000 g | ORAL_TABLET | Freq: Two times a day (BID) | ORAL | Status: AC

## 2023-01-03 NOTE — TOC Transition Note (Signed)
Transition of Care [facility]) - CM/SW Discharge Note   Patient Details  Name: [name] MRN: [id number] Date of Birth: 1929/05/26  Transition of Care [facility]) CM/SW Contact:  Adrian Prows, RN Phone Number: 01/03/2023, 12:43 PM   Clinical Narrative:    D/C orders received; notified Star at [facility]; she gave RM # [id number], call report # [phone]; d/c summary and SNF transfer report sent via SNF hub; pt notified and agrees to d/c plan; LVM for niece [name] ([phone]); pt transport by Sharin Mons; [facility] called at 1245; spoke w/ operator # [phone]; no TOC needs.   Final next level of care: Skilled Nursing Facility Barriers to Discharge: No Barriers Identified   Patient Goals and CMS Choice CMS Medicare.gov Compare Post Acute Care list provided to:: Patient Represenative (must comment) Choice offered to / list presented to : [facility] POA / Guardian  Discharge Placement                Patient chooses bed at: [facility] Patient to be transferred to facility by: [facility] Name of family member notified: LVM for pt's niece [name] ([phone]) Patient and family notified of of transfer: 01/03/23  Discharge Plan and Services Additional resources added to the After Visit Summary for   In-house Referral: Clinical Social Work Discharge Planning Services: NA Post Acute Care Choice: Skilled Nursing Facility          DME Arranged: N/A DME Agency: NA                  Social Determinants of Health (SDOH) Interventions SDOH Screenings   Food Insecurity: No Food Insecurity (12/26/2022)  Housing: Low Risk  (12/26/2022)  Transportation Needs: No Transportation Needs (12/26/2022)  Utilities: Not At Risk (12/26/2022)  Depression (PHQ2-9): Low Risk  (09/18/2020)  Social Connections: Unknown (09/16/2021)   [facility]  Tobacco Use: Low Risk  (12/26/2022)     Readmission Risk Interventions    12/28/2022    3:43 PM 12/15/2022   11:12 AM  10/27/2022   12:30 PM  Readmission Risk Prevention Plan  Transportation Screening Complete Complete Complete  PCP or Specialist Appt within 3-5 Days Complete  Complete  HRI or Home Care Consult Complete  Complete  Social Work Consult for Recovery Care Planning/Counseling Complete  Complete  Palliative Care Screening Not Applicable    Medication Review Oceanographer) Complete Complete Complete  PCP or Specialist appointment within 3-5 days of discharge  Complete   HRI or Home Care Consult  Complete   SW Recovery Care/Counseling Consult  Complete   Palliative Care Screening  Not Applicable   Skilled Nursing Facility  Complete

## 2023-01-03 NOTE — Discharge Summary (Signed)
Physician Discharge Summary   [name] KVQ:[id number] DOB: [birth year] DOA: 12/26/2022  PCP: Verlon Au, MD  Admit date: 12/26/2022 Discharge date: 01/03/2023  Admitted From: [facility] Disposition:  [name] Discharging physician: Lewie Chamber, MD Barriers to discharge: none  Recommendations at discharge: Follow up with orthopedic surgery Pancreatic lesion, recommended for MRI abdomen in 1 year (noted on CT renal study 12/26/22)  Discharge Condition: stable CODE STATUS: DNR Diet recommendation:  Diet Orders (From admission, onward)     Start     Ordered   01/03/23 0000  Diet general        01/03/23 1221   12/26/22 1632  Diet regular Room service appropriate? Yes; Fluid consistency: Thin  Diet effective now       Question Answer Comment  Room service appropriate? Yes   Fluid consistency: Thin      12/26/22 1631            Hospital Course: [name] is a 89 yo female with PMH significant for left hip fracture status post ORIF 12/12/2022 who has had worsening weakness and deconditioning since surgery. She was discharged to SNF and upon repositioning in the bed prior to admission she developed worsened pain in her left hip.  She was therefore referred to the hospital for evaluation. CT of the left lower extremity was obtained on admission which was negative for hardware surgical complications.  She was also evaluated by orthopedic surgery given CT findings.  There was not felt to be underlying infectious complications.  She was recommended for continuing with PT as able.  Assessment and Plan:  Left femoral fracture s/p ORIF 12/12/2022, by Dr Linna Caprice Physical deconditioning  -CT left Femur: "Prior left hip replacement.  No hardware complicating feature. No acute bony abnormality. Irregular ill-defined fluid collection and gas seen anterior to the left hip replacement within the quadriceps muscle extending inferiorly into the mid thigh region. This could reflect postoperative  hematoma although infection/developing abscess cannot be excluded." -Appreciate orthopedic surgery evaluation.  No concern for postop complications at this time - Continue PT/OT   Acute cystitis - resolved  Patient with leukocytosis, pyuria Follow urine culture: E coli, 100K. Pan sensitive.  Blood culture. No growth to date.  - completed 5 day course of Rocephin on 8/28   Left Knee pain, edema Tylenol, voltaren   Near syncope BP stable. Hb stable Suspect related to pain, vasovagal   Hernia of abdominal cavity Seen on CT scan, outpatient follow-up   LE edema Doppler negative for DVT  She has received IV lasix time two doses Started ensure, improve nutrition   Pancreatic lesion Stable diffuse biliary ductal dilatation, with common bile duct measuring 21 mm. Known previously, need outpatient follow-up   Hyponatremia: chronic, prior sodium 125---129 She was on sodium tablet last admission Continue with sodium tablet BID x 4 more days    Constipation, fecal impaction: Per nursing staff, patient has had multiples BM since yesterday Continue with bowel regimen   Seizure disorder: Continue with phenytoin.      Principal Diagnosis: Closed displaced fracture of left femoral neck [facility])  Discharge Diagnoses: Active Hospital Problems   Diagnosis Date Noted   Closed displaced fracture of left femoral neck (HCC) 12/26/2022    Priority: 1.   Physical deconditioning 01/01/2023    Priority: 2.   Constipation 05/27/2020    Priority: 3.   Hyponatremia     Priority: 4.   Pancreatic mass 12/26/2022    Priority: 5.   Pressure  injury of skin 01/01/2023   Pressure injury of sacral region, stage 2 (HCC) 01/01/2023   Hypomagnesemia 01/01/2023   Hernia of abdominal cavity 12/26/2022   Hypokalemia    Seizure disorder (HCC) 10/16/2014    Resolved Hospital Problems   Diagnosis Date Noted Date Resolved   UTI (urinary tract infection) 12/26/2022 01/01/2023    Priority: 2.      Discharge Instructions     Diet general   Complete by: As directed    Increase activity slowly   Complete by: As directed    No wound care   Complete by: As directed       Allergies as of 01/03/2023       Reactions   Bystolic [nebivolol Hcl] Other (See Comments)   "extreme weakness, heaviness in legs & arms, swelling in legs/arms/face, swollen abdomen, pain in bladder, feet pain, soreness in chest"   Carbamazepine Other (See Comments)   Blood poisoning   Cholestyramine Itching, Nausea And Vomiting, Rash, Other (See Comments)   "itching rash on stomach, bloated, nausea, vomiting, sleeplessness, extreme pain in arms"  Patient is taking this in 2022.   Morphine Other (See Comments)   Feels morbid, weak, still in pain   Niacin Palpitations   Fast heart beat Other reaction(s): Unknown   Norvasc [amlodipine Besylate] Other (See Comments)   "extreme fluid retention/pain)   Optivar [azelastine Hcl] Photosensitivity   Repaglinide Hives   Sular [nisoldipine Er] Other (See Comments)   "severe headaches, swelling eyes, hands, feet, shortness of breath, weak, flushed face, brain boiling, fluid retention, high blood sugar, nervous, heart fast beating"   Telmisartan Other (See Comments)   "headache, difficulty urinating, high blood sugar, fluid retention"   Amoxicillin-pot Clavulanate Rash   Tolerates Unasyn (>10 doses)   Cefdinir Swelling   Vaginal irritation, breathing,    Ciprofloxacin Hcl Hives   Clonidine Other (See Comments)   Dry mouth, fluid retention   Codeine Nausea And Vomiting   Ezetimibe Other (See Comments)   Made weak   Hydroxychloroquine Other (See Comments)   Low platlets   Naproxen Other (See Comments)   Shrinks bladder   Sulfa Antibiotics Rash   Ziac [bisoprolol-hydrochlorothiazide] Other (See Comments)   "stopped urination"   Amlodipine Besy-benazepril Hcl Cough   Azelastine Other (See Comments)   Unknown reaction - Per MAR   Elavil [amitriptyline] Other  (See Comments)   Gave Pt nightmares   Empagliflozin Other (See Comments)   "Caused yeast infection, slowed my urine"   Hydralazine Other (See Comments)   "does not reduce high blood pressure, pain in arm, high pressure, felt like I was on verge of heart attack, really weak"   Iodine I 131 Tositumomab Other (See Comments)   Unknown reaction - Per MAR   Kenalog [triamcinolone] Diarrhea   Per MAR   Keppra [levetiracetam] Other (See Comments)   Shaking   Lamotrigine Itching, Other (See Comments)   Pregabalin Swelling, Other (See Comments)   Weight gain   Pseudoephedrine Other (See Comments)   Unknown reaction   Risedronate    Unknown reaction - Per MAR   Ru-hist D [brompheniramine-phenylephrine] Other (See Comments)   Unknown reaction - Per MAR   Sumatriptan Other (See Comments)   Topiramate Other (See Comments)   Dry eyes   Ace Inhibitors Other (See Comments)   unknown   Aspirin Other (See Comments)   Unknown reaction   Atacand [candesartan] Other (See Comments)   Unknown reaction - MAR   Bextra [valdecoxib]  Other (See Comments)   Unknown reaction - Per MAR   Bisoprolol-hydrochlorothiazide Other (See Comments)   Unknown reaction - Per MAR   Cefadroxil Other (See Comments)   Unknown reaction   Celecoxib Rash   Hydrocodone Other (See Comments)   unknown   Iodinated Contrast Media Rash, Other (See Comments)   "All over" rash   Meloxicam Other (See Comments)   unknown   Methylprednisolone Sodium Succinate Other (See Comments)   unknown   Nabumetone Other (See Comments)   Unknown reaction   Penicillins Other (See Comments)   unknown   Pseudoephedrine-guaifenesin Other (See Comments)   unknown   Rofecoxib Other (See Comments)   Unknown reaction - MAR Other reaction(s): Unknown   Ru-tuss [chlorphen-pse-atrop-hyos-scop] Other (See Comments)   unknown   Sulfonamide Derivatives Other (See Comments)   unknown   Sulphur [elemental Sulfur] Other (See Comments)   unknown    Telithromycin Other (See Comments)   unknown   Terfenadine Other (See Comments)   Unknown reaction - Per MAR   Trandolapril-verapamil Hcl Er Other (See Comments)   Headache, difficulty urinating, high blood sugar, fluid retention Pt is taking Tarka (trandolapril-verapamil) currently, but requests the medication stay in her allergy list   Valium [diazepam] Other (See Comments)   Makes her mean and hyper        Medication List     TAKE these medications    Acetaminophen 325 MG Caps Take 650 mg by mouth 3 (three) times daily.   apixaban 2.5 MG Tabs tablet Commonly known as: Eliquis Take 1 tablet (2.5 mg total) by mouth 2 (two) times daily.   Biofreeze Cool The Pain 4 % Gel Generic drug: Menthol (Topical Analgesic) Apply 1 g topically 3 (three) times daily.   cholestyramine light 4 g packet Commonly known as: PREVALITE Take 1 packet (4 g total) by mouth 2 (two) times daily.   diclofenac Sodium 1 % Gel Commonly known as: VOLTAREN Apply 2 g topically 4 (four) times daily as needed (for knee and back pain).   insulin lispro 100 UNIT/ML injection Commonly known as: HUMALOG Inject 0-12 Units into the skin 3 (three) times daily before meals.   Insulin Pen Needle 32G X 4 MM Misc Used to inject insulin 3x daily What changed:  how much to take how to take this when to take this   Lantus SoloStar 100 UNIT/ML Solostar Pen Generic drug: insulin glargine Inject 10 Units into the skin at bedtime. What changed: how much to take   levothyroxine 75 MCG tablet Commonly known as: SYNTHROID Take 1 tablet (75 mcg total) by mouth daily before breakfast.   oxyCODONE 5 MG immediate release tablet Commonly known as: Oxy IR/ROXICODONE Take 0.5 tablets (2.5 mg total) by mouth every 6 (six) hours as needed for breakthrough pain.   pantoprazole 40 MG tablet Commonly known as: PROTONIX Take 40 mg by mouth daily.   Phenytek 300 MG ER capsule Generic drug: phenytoin TAKE 1 CAPSULE AT  BEDTIME   polyethylene glycol 17 g packet Commonly known as: MIRALAX / GLYCOLAX Take 17 g by mouth daily as needed. What changed: reasons to take this   primidone 50 MG tablet Commonly known as: MYSOLINE Take 3 tablets (150 mg total) by mouth at bedtime.   primidone 250 MG tablet Commonly known as: MYSOLINE Take 1 tablet (250 mg total) by mouth daily.   Prodigy Voice Blood Glucose w/Device Kit Use to check blood sugar 1 time per day.   sodium chloride  1 g tablet Take 2 tablets (2 g total) by mouth 2 (two) times daily with a meal for 4 days.   Systane 0.4-0.3 % Soln Generic drug: Polyethyl Glycol-Propyl Glycol Place 2 drops into both eyes 3 (three) times daily.   thiamine 250 MG tablet Take 250 mg by mouth daily.   Verapamil HCl CR 200 MG Cp24 Take 200 mg by mouth daily.   Vitamin D-3 125 MCG (5000 UT) Tabs Take 5,000 Units by mouth daily.        Contact information for after-discharge care     Destination     [facility] Preferred SNF .   Service: Skilled Nursing Contact information: [address] [place] [address] [phone]                    Allergies  Allergen Reactions   Bystolic [Nebivolol Hcl] Other (See Comments)    "extreme weakness, heaviness in legs & arms, swelling in legs/arms/face, swollen abdomen, pain in bladder, feet pain, soreness in chest"   Carbamazepine Other (See Comments)    Blood poisoning   Cholestyramine Itching, Nausea And Vomiting, Rash and Other (See Comments)    "itching rash on stomach, bloated, nausea, vomiting, sleeplessness, extreme pain in arms"  Patient is taking this in 2022.   Morphine Other (See Comments)    Feels morbid, weak, still in pain   Niacin Palpitations    Fast heart beat Other reaction(s): Unknown   Norvasc [Amlodipine Besylate] Other (See Comments)    "extreme fluid retention/pain)   Optivar [Azelastine Hcl] Photosensitivity   Repaglinide Hives    Sular [Nisoldipine Er] Other (See Comments)    "severe headaches, swelling eyes, hands, feet, shortness of breath, weak, flushed face, brain boiling, fluid retention, high blood sugar, nervous, heart fast beating"   Telmisartan Other (See Comments)    "headache, difficulty urinating, high blood sugar, fluid retention"   Amoxicillin-Pot Clavulanate Rash    Tolerates Unasyn (>10 doses)   Cefdinir Swelling    Vaginal irritation, breathing,    Ciprofloxacin Hcl Hives   Clonidine Other (See Comments)    Dry mouth, fluid retention   Codeine Nausea And Vomiting   Ezetimibe Other (See Comments)    Made weak   Hydroxychloroquine Other (See Comments)    Low platlets   Naproxen Other (See Comments)    Shrinks bladder   Sulfa Antibiotics Rash   Ziac [Bisoprolol-Hydrochlorothiazide] Other (See Comments)    "stopped urination"   Amlodipine Besy-Benazepril Hcl Cough   Azelastine Other (See Comments)    Unknown reaction - Per MAR   Elavil [Amitriptyline] Other (See Comments)    Gave Pt nightmares   Empagliflozin Other (See Comments)    "Caused yeast infection, slowed my urine"   Hydralazine Other (See Comments)    "does not reduce high blood pressure, pain in arm, high pressure, felt like I was on verge of heart attack, really weak"   Iodine I 131 Tositumomab Other (See Comments)    Unknown reaction - Per MAR   Kenalog [Triamcinolone] Diarrhea    Per MAR   Keppra [Levetiracetam] Other (See Comments)    Shaking   Lamotrigine Itching and Other (See Comments)   Pregabalin Swelling and Other (See Comments)    Weight gain   Pseudoephedrine Other (See Comments)    Unknown reaction   Risedronate     Unknown reaction - Per MAR   Ru-Hist D [Brompheniramine-Phenylephrine] Other (See Comments)    Unknown  reaction - Per MAR   Sumatriptan Other (See Comments)   Topiramate Other (See Comments)    Dry eyes   Ace Inhibitors Other (See Comments)    unknown   Aspirin Other (See Comments)     Unknown reaction   Atacand [Candesartan] Other (See Comments)    Unknown reaction - MAR   Bextra [Valdecoxib] Other (See Comments)    Unknown reaction - Per MAR   Bisoprolol-Hydrochlorothiazide Other (See Comments)    Unknown reaction - Per MAR   Cefadroxil Other (See Comments)    Unknown reaction   Celecoxib Rash   Hydrocodone Other (See Comments)    unknown   Iodinated Contrast Media Rash and Other (See Comments)    "All over" rash   Meloxicam Other (See Comments)    unknown   Methylprednisolone Sodium Succinate Other (See Comments)    unknown   Nabumetone Other (See Comments)    Unknown reaction   Penicillins Other (See Comments)    unknown   Pseudoephedrine-Guaifenesin Other (See Comments)    unknown   Rofecoxib Other (See Comments)    Unknown reaction - MAR Other reaction(s): Unknown   Ru-Tuss [Chlorphen-Pse-Atrop-Hyos-Scop] Other (See Comments)    unknown   Sulfonamide Derivatives Other (See Comments)    unknown   Sulphur [Elemental Sulfur] Other (See Comments)    unknown   Telithromycin Other (See Comments)    unknown   Terfenadine Other (See Comments)    Unknown reaction - Per MAR   Trandolapril-Verapamil Hcl Er Other (See Comments)    Headache, difficulty urinating, high blood sugar, fluid retention  Pt is taking Tarka (trandolapril-verapamil) currently, but requests the medication stay in her allergy list   Valium [Diazepam] Other (See Comments)    Makes her mean and hyper    Consultations: Orthopedic surgery   Discharge Exam: BP (!) 142/57 (BP Location: Left Arm)   Pulse 78   Temp 97.6 F (36.4 C) (Oral)   Resp 18   Ht 5\' 7"  (1.702 m)   Wt 65 kg   SpO2 98%   BMI 22.44 kg/m  Physical Exam Constitutional:      Comments: Elderly woman laying in bed in no distress; more comfortable appearing today   HENT:     Head: Normocephalic and atraumatic.     Mouth/Throat:     Mouth: Mucous membranes are moist.  Eyes:     Extraocular Movements:  Extraocular movements intact.  Cardiovascular:     Rate and Rhythm: Normal rate and regular rhythm.  Pulmonary:     Effort: Pulmonary effort is normal.     Breath sounds: Normal breath sounds.  Abdominal:     General: Bowel sounds are normal. There is no distension.     Palpations: Abdomen is soft.     Tenderness: There is no abdominal tenderness.  Musculoskeletal:        General: Swelling (B/L LE edema in hips) present.     Cervical back: Normal range of motion and neck supple.     Comments: Left surgical dressing in place, compartments soft; left knee effusion stable (no erythema, calor, dolor)  Skin:    General: Skin is warm and dry.  Neurological:     Mental Status: Mental status is at baseline.      The results of significant diagnostics from this hospitalization (including imaging, microbiology, ancillary and laboratory) are listed below for reference.   Microbiology: Recent Results (from the past 240 hour(s))  Urine Culture  Status: Abnormal   Collection Time: 12/26/22  4:21 PM   Specimen: Urine, Clean Catch  Result Value Ref Range Status   Specimen Description   Final    URINE, CLEAN CATCH Performed at [facility], [address], [place], [place] [address]    Special Requests   Final    NONE Performed at [facility], [address], [place], [place] [address]    Culture >=100,000 COLONIES/mL ESCHERICHIA COLI (A)  Final   Report Status 12/28/2022 FINAL  Final   Organism ID, Bacteria ESCHERICHIA COLI (A)  Final      Susceptibility   Escherichia coli - MIC*    AMPICILLIN 8 SENSITIVE Sensitive     CEFAZOLIN <=4 SENSITIVE Sensitive     CEFEPIME <=0.12 SENSITIVE Sensitive     CEFTRIAXONE <=0.25 SENSITIVE Sensitive     CIPROFLOXACIN <=0.25 SENSITIVE Sensitive     GENTAMICIN <=1 SENSITIVE Sensitive     IMIPENEM <=0.25 SENSITIVE Sensitive     NITROFURANTOIN <=16 SENSITIVE Sensitive     TRIMETH/SULFA <=20 SENSITIVE Sensitive      AMPICILLIN/SULBACTAM <=2 SENSITIVE Sensitive     PIP/TAZO <=4 SENSITIVE Sensitive     * >=100,000 COLONIES/mL ESCHERICHIA COLI  Culture, blood (Routine X 2) w Reflex to ID Panel     Status: None   Collection Time: 12/26/22 11:12 PM   Specimen: BLOOD  Result Value Ref Range Status   Specimen Description   Final    BLOOD BLOOD LEFT ARM Performed at [facility], [address] [place], [place] [address]    Special Requests   Final    BOTTLES DRAWN AEROBIC AND ANAEROBIC Blood Culture adequate volume Performed at [facility], [address] [place], [place] [address]    Culture   Final    NO GROWTH 5 DAYS Performed at [facility], [address], [place], [place] [address]    Report Status 01/01/2023 FINAL  Final  Culture, blood (Routine X 2) w Reflex to ID Panel     Status: None   Collection Time: 12/26/22 11:12 PM   Specimen: BLOOD  Result Value Ref Range Status   Specimen Description   Final    BLOOD BLOOD LEFT HAND Performed at [facility], [address] [place], [place] [address]    Special Requests   Final    BOTTLES DRAWN AEROBIC AND ANAEROBIC Blood Culture adequate volume Performed at [facility], [address], [place], [place] [address]    Culture   Final    NO GROWTH 5 DAYS Performed at [facility], [address], [place], [place] [address]    Report Status 01/01/2023 FINAL  Final     Labs: BNP (last 3 results) No results for input(s): "BNP" in the last 8760 hours. Basic Metabolic Panel: Recent Labs  Lab 12/28/22 0804 12/29/22 0531 12/30/22 0525 01/01/23 1112  NA 128* 126* 128* 133*  K 3.8 3.6 3.8 3.2*  CL 97* 95* 97* 100  CO2 23 24 23 26   GLUCOSE 128* 156* 173* 243*  BUN 11 11 9  6*  CREATININE 0.47 0.52 0.49 0.51  CALCIUM 7.7* 7.7* 7.8* 7.7*  MG  --   --   --  1.6*   Liver Function Tests: No results for input(s): "AST", "ALT", "ALKPHOS", "BILITOT",  "PROT", "ALBUMIN" in the last 168 hours. No results for input(s): "LIPASE", "AMYLASE" in the last 168 hours. No results for  input(s): "AMMONIA" in the last 168 hours. CBC: Recent Labs  Lab 12/28/22 0804 12/29/22 0531 12/30/22 0525 01/01/23 1112  WBC 7.6 6.6 6.6 5.3  NEUTROABS  --   --   --  3.7  HGB 9.8* 9.4* 9.3* 9.8*  HCT 31.4* 29.2* 30.0* 31.1*  MCV 96.3 94.5 98.7 96.0  PLT 516* 425* 303 453*   Cardiac Enzymes: No results for input(s): "CKTOTAL", "CKMB", "CKMBINDEX", "TROPONINI" in the last 168 hours. BNP: Invalid input(s): "POCBNP" CBG: Recent Labs  Lab 01/02/23 1628 01/02/23 1727 01/02/23 2127 01/03/23 0719 01/03/23 1152  GLUCAP 210* 217* 171* 96 166*   D-Dimer No results for input(s): "DDIMER" in the last 72 hours. Hgb A1c No results for input(s): "HGBA1C" in the last 72 hours. Lipid Profile No results for input(s): "CHOL", "HDL", "LDLCALC", "TRIG", "CHOLHDL", "LDLDIRECT" in the last 72 hours. Thyroid function studies No results for input(s): "TSH", "T4TOTAL", "T3FREE", "THYROIDAB" in the last 72 hours.  Invalid input(s): "FREET3" Anemia work up No results for input(s): "VITAMINB12", "FOLATE", "FERRITIN", "TIBC", "IRON", "RETICCTPCT" in the last 72 hours. Urinalysis    Component Value Date/Time   COLORURINE YELLOW 12/26/2022 1404   APPEARANCEUR TURBID (A) 12/26/2022 1404   LABSPEC 1.006 12/26/2022 1404   PHURINE 6.0 12/26/2022 1404   GLUCOSEU NEGATIVE 12/26/2022 1404   GLUCOSEU NEGATIVE 12/28/2013 1036   HGBUR MODERATE (A) 12/26/2022 1404   HGBUR trace-intact 06/29/2007 1304   BILIRUBINUR NEGATIVE 12/26/2022 1404   BILIRUBINUR negative 01/19/2017 1049   KETONESUR NEGATIVE 12/26/2022 1404   PROTEINUR 100 (A) 12/26/2022 1404   UROBILINOGEN 0.2 01/19/2017 1049   UROBILINOGEN 1.0 03/25/2015 1755   NITRITE NEGATIVE 12/26/2022 1404   LEUKOCYTESUR MODERATE (A) 12/26/2022 1404   Sepsis Labs Recent Labs  Lab 12/28/22 0804 12/29/22 0531 12/30/22 0525  01/01/23 1112  WBC 7.6 6.6 6.6 5.3   Microbiology Recent Results (from the past 240 hour(s))  Urine Culture     Status: Abnormal   Collection Time: 12/26/22  4:21 PM   Specimen: Urine, Clean Catch  Result Value Ref Range Status   Specimen Description   Final    URINE, CLEAN CATCH Performed at [facility], [address] [place], [place] [address]    Special Requests   Final    NONE Performed at [facility], [address], [place], [place] [address]    Culture >=100,000 COLONIES/mL ESCHERICHIA COLI (A)  Final   Report Status 12/28/2022 FINAL  Final   Organism ID, Bacteria ESCHERICHIA COLI (A)  Final      Susceptibility   Escherichia coli - MIC*    AMPICILLIN 8 SENSITIVE Sensitive     CEFAZOLIN <=4 SENSITIVE Sensitive     CEFEPIME <=0.12 SENSITIVE Sensitive     CEFTRIAXONE <=0.25 SENSITIVE Sensitive     CIPROFLOXACIN <=0.25 SENSITIVE Sensitive     GENTAMICIN <=1 SENSITIVE Sensitive     IMIPENEM <=0.25 SENSITIVE Sensitive     NITROFURANTOIN <=16 SENSITIVE Sensitive     TRIMETH/SULFA <=20 SENSITIVE Sensitive     AMPICILLIN/SULBACTAM <=2 SENSITIVE Sensitive     PIP/TAZO <=4 SENSITIVE Sensitive     * >=100,000 COLONIES/mL ESCHERICHIA COLI  Culture, blood (Routine X 2) w Reflex to ID Panel     Status: None   Collection Time: 12/26/22 11:12 PM   Specimen: BLOOD  Result Value Ref Range Status   Specimen Description   Final    BLOOD BLOOD LEFT ARM Performed at [facility], [address]  [place], [place] [address]    Special Requests   Final    BOTTLES DRAWN AEROBIC AND ANAEROBIC Blood Culture adequate volume Performed at [facility], [address], [place], [place] [address]    Culture   Final    NO GROWTH 5 DAYS Performed at [facility], [address] [place], [place] [address]    Report Status 01/01/2023 FINAL  Final  Culture, blood (Routine X 2) w Reflex to ID Panel     Status:  None   Collection Time: 12/26/22 11:12 PM   Specimen: BLOOD  Result Value Ref Range Status   Specimen Description   Final    BLOOD BLOOD LEFT HAND Performed at [facility], [address], [place], [place] [address]    Special Requests   Final    BOTTLES DRAWN AEROBIC AND ANAEROBIC Blood Culture adequate volume Performed at [facility], [address], [place], [place] [address]    Culture   Final    NO GROWTH 5 DAYS Performed at [facility], [address], [place], [place] [address]    Report Status 01/01/2023 FINAL  Final    Procedures/Studies: VAS [place] LOWER EXTREMITY VENOUS (DVT)  Result Date: 12/27/2022  Lower Venous DVT Study Patient Name:  Demiana S Bucy   Date of Exam:   12/27/2022 Medical Rec #: [id number]   Accession #:    [id number] Date of Birth: 19-Jul-1929  Patient Gender: F Patient Age:   74 years Exam Location:  [facility] Procedure:      VAS [place] LOWER EXTREMITY VENOUS (DVT) Referring Phys: Jon Billings REGALADO --------------------------------------------------------------------------------  Indications: Edema.  Risk Factors: Surgery. Limitations: Poor ultrasound/tissue interface, bandages and patient positioning, patient immobility, patient pain tolerance. Comparison Study: No prior studies. Performing Technologist: Chanda Busing RVT  Examination Guidelines: A complete evaluation includes B-mode imaging, spectral Doppler, color Doppler, and power Doppler as needed of all accessible portions of each vessel. Bilateral testing is considered an integral part of a complete examination. Limited examinations for reoccurring indications Dempsey be performed as noted. The reflux portion of the exam is performed with the patient in reverse Trendelenburg.  +---------+---------------+---------+-----------+----------+--------------+ RIGHT    CompressibilityPhasicitySpontaneityPropertiesThrombus Aging  +---------+---------------+---------+-----------+----------+--------------+ CFV      Full           Yes      Yes                                 +---------+---------------+---------+-----------+----------+--------------+ SFJ      Full                                                        +---------+---------------+---------+-----------+----------+--------------+ FV Prox  Full                                                        +---------+---------------+---------+-----------+----------+--------------+ FV Mid   Full           Yes      Yes                                 +---------+---------------+---------+-----------+----------+--------------+  FV Distal               Yes      Yes                                 +---------+---------------+---------+-----------+----------+--------------+ PFV      Full                                                        +---------+---------------+---------+-----------+----------+--------------+ POP      Full           Yes      Yes                                 +---------+---------------+---------+-----------+----------+--------------+ PTV      Full                                                        +---------+---------------+---------+-----------+----------+--------------+ PERO     Full                                                        +---------+---------------+---------+-----------+----------+--------------+   +---------+---------------+---------+-----------+----------+-------------------+ LEFT     CompressibilityPhasicitySpontaneityPropertiesThrombus Aging      +---------+---------------+---------+-----------+----------+-------------------+ CFV                                                   Not well visualized +---------+---------------+---------+-----------+----------+-------------------+ SFJ                                                   Not well visualized  +---------+---------------+---------+-----------+----------+-------------------+ FV Prox  Full           Yes      Yes                                      +---------+---------------+---------+-----------+----------+-------------------+ FV Mid                  Yes      Yes                                      +---------+---------------+---------+-----------+----------+-------------------+ FV Distal               Yes      Yes                                      +---------+---------------+---------+-----------+----------+-------------------+  PFV                                                   Not well visualized +---------+---------------+---------+-----------+----------+-------------------+ POP                     Yes      Yes                                      +---------+---------------+---------+-----------+----------+-------------------+ PTV      Full                                                             +---------+---------------+---------+-----------+----------+-------------------+ PERO                                                  Not well visualized +---------+---------------+---------+-----------+----------+-------------------+     Summary: RIGHT: - There is no evidence of deep vein thrombosis in the lower extremity. However, portions of this examination were limited- see technologist comments above.  - No cystic structure found in the popliteal fossa.  LEFT: - There is no evidence of deep vein thrombosis in the lower extremity. However, portions of this examination were limited- see technologist comments above.  - No cystic structure found in the popliteal fossa.  *See table(s) above for measurements and observations. Electronically signed by Coral Else MD on 12/27/2022 at 8:53:53 PM.    Final    DG CHEST PORT 1 VIEW  Result Date: 12/26/2022 CLINICAL DATA:  Recent surgery, patient states unable to bear weight since the surgery. Progressive  loss of function of the left lower extremity. Recent left hip replacement. Urinary tract infection. EXAM: PORTABLE CHEST 1 VIEW COMPARISON:  12/09/2022 FINDINGS: The patient is rotated to the right on today's radiograph, reducing diagnostic sensitivity and specificity. The lungs appear clear. Cardiac and mediastinal contours normal. No blunting of the costophrenic angles. No discrete bony abnormality identified. IMPRESSION: 1. No acute findings. Electronically Signed   By: Gaylyn Rong M.D.   On: 12/26/2022 19:01   CT FEMUR LEFT WO CONTRAST  Result Date: 12/26/2022 CLINICAL DATA:  Left leg pain.  Stress fracture suspected. EXAM: CT OF THE LOWER LEFT EXTREMITY WITHOUT CONTRAST TECHNIQUE: Multidetector CT imaging of the lower left extremity from the pelvis through the foot was performed according to the standard protocol. RADIATION DOSE REDUCTION: This exam was performed according to the departmental dose-optimization program which includes automated exposure control, adjustment of the mA and/or kV according to patient size and/or use of iterative reconstruction technique. COMPARISON:  None Available. FINDINGS: Bones/Joint/Cartilage Prior left hip replacement. No hardware complicating feature. No acute bony abnormality. Specifically, no fracture, subluxation, or dislocation. Ligaments Suboptimally assessed by CT. Muscles and Tendons There is gas noted around the left proximal femur and hip anteriorly within the quadriceps muscle. This tracks down in the quadriceps muscle into the mid thigh. There is also likely irregular ill-defined fluid  collection anterior to left hip replacement. This is difficult to measure due to its ill-defined appearance. This could reflect postoperative hematoma, but given the gas present, cannot exclude infection/developing abscess. Soft tissues As above IMPRESSION: Prior left hip replacement.  No hardware complicating feature. No acute bony abnormality. Irregular ill-defined fluid  collection and gas seen anterior to the left hip replacement within the quadriceps muscle extending inferiorly into the mid thigh region. This could reflect postoperative hematoma although infection/developing abscess cannot be excluded. Electronically Signed   By: Charlett Nose M.D.   On: 12/26/2022 18:29   CT EXTREMITY LOWER LEFT WO CONTRAST  Result Date: 12/26/2022 CLINICAL DATA:  Left leg pain.  Stress fracture suspected. EXAM: CT OF THE LOWER LEFT EXTREMITY WITHOUT CONTRAST TECHNIQUE: Multidetector CT imaging of the lower left extremity from the pelvis through the foot was performed according to the standard protocol. RADIATION DOSE REDUCTION: This exam was performed according to the departmental dose-optimization program which includes automated exposure control, adjustment of the mA and/or kV according to patient size and/or use of iterative reconstruction technique. COMPARISON:  None Available. FINDINGS: Bones/Joint/Cartilage Prior left hip replacement. No hardware complicating feature. No acute bony abnormality. Specifically, no fracture, subluxation, or dislocation. Ligaments Suboptimally assessed by CT. Muscles and Tendons There is gas noted around the left proximal femur and hip anteriorly within the quadriceps muscle. This tracks down in the quadriceps muscle into the mid thigh. There is also likely irregular ill-defined fluid collection anterior to left hip replacement. This is difficult to measure due to its ill-defined appearance. This could reflect postoperative hematoma, but given the gas present, cannot exclude infection/developing abscess. Soft tissues As above IMPRESSION: Prior left hip replacement.  No hardware complicating feature. No acute bony abnormality. Irregular ill-defined fluid collection and gas seen anterior to the left hip replacement within the quadriceps muscle extending inferiorly into the mid thigh region. This could reflect postoperative hematoma although  infection/developing abscess cannot be excluded. Electronically Signed   By: Charlett Nose M.D.   On: 12/26/2022 18:29   CT FOOT LEFT WO CONTRAST  Result Date: 12/26/2022 CLINICAL DATA:  Left leg pain.  Stress fracture suspected. EXAM: CT OF THE LOWER LEFT EXTREMITY WITHOUT CONTRAST TECHNIQUE: Multidetector CT imaging of the lower left extremity from the pelvis through the foot was performed according to the standard protocol. RADIATION DOSE REDUCTION: This exam was performed according to the departmental dose-optimization program which includes automated exposure control, adjustment of the mA and/or kV according to patient size and/or use of iterative reconstruction technique. COMPARISON:  None Available. FINDINGS: Bones/Joint/Cartilage Prior left hip replacement. No hardware complicating feature. No acute bony abnormality. Specifically, no fracture, subluxation, or dislocation. Ligaments Suboptimally assessed by CT. Muscles and Tendons There is gas noted around the left proximal femur and hip anteriorly within the quadriceps muscle. This tracks down in the quadriceps muscle into the mid thigh. There is also likely irregular ill-defined fluid collection anterior to left hip replacement. This is difficult to measure due to its ill-defined appearance. This could reflect postoperative hematoma, but given the gas present, cannot exclude infection/developing abscess. Soft tissues As above IMPRESSION: Prior left hip replacement.  No hardware complicating feature. No acute bony abnormality. Irregular ill-defined fluid collection and gas seen anterior to the left hip replacement within the quadriceps muscle extending inferiorly into the mid thigh region. This could reflect postoperative hematoma although infection/developing abscess cannot be excluded. Electronically Signed   By: Charlett Nose M.D.   On: 12/26/2022 18:29  CT Renal Stone Study  Result Date: 12/26/2022 CLINICAL DATA:  Left abdominal and flank pain.  EXAM: CT ABDOMEN AND PELVIS WITHOUT CONTRAST TECHNIQUE: Multidetector CT imaging of the abdomen and pelvis was performed following the standard protocol without IV contrast. RADIATION DOSE REDUCTION: This exam was performed according to the departmental dose-optimization program which includes automated exposure control, adjustment of the mA and/or kV according to patient size and/or use of iterative reconstruction technique. COMPARISON:  01/23/2022 FINDINGS: Lower chest: No acute findings. Hepatobiliary: No mass visualized on this unenhanced exam. Gallbladder is unremarkable. Stable diffuse biliary ductal dilatation, with common bile duct measuring 21 mm. Pancreas: Punctate calcifications are seen mainly in the pancreatic head, consistent with chronic pancreatitis. No evidence of acute pancreatitis. A 2.3 cm low-attenuation lesion in the pancreatic tail remains stable, and Blasdel be due to a small pseudocyst or indolent cystic neoplasm such as a side-branch IPMN. No No evidence of pancreatic ductal dilatation. Spleen:  Within normal limits in size. Adrenals/Urinary tract: No evidence of urolithiasis or hydronephrosis. Distal ureters and bladder not well visualized due to severe beam hardening artifact from bilateral hip prostheses. Stomach/Bowel: No evidence of obstruction, inflammatory process, or abnormal fluid collections. A Richter hernia is seen in the paraumbilical region containing anti mesenteric wall of the transverse colon. Large amount of stool seen in the rectum. Vascular/Lymphatic: No pathologically enlarged lymph nodes identified. No evidence of abdominal aortic aneurysm. Reproductive: Limited visualization noted due to severe artifact from bilateral hip prostheses. Other:  Diffuse body wall edema. Musculoskeletal: No suspicious bone lesions identified. Old T12 vertebroplasty noted. IMPRESSION: No evidence of urolithiasis, hydronephrosis, or other acute findings. Stable diffuse biliary ductal dilatation,  with common bile duct measuring 21 mm. Recommend correlation with liver function tests, and consider abdomen MRI without and with contrast for further evaluation. Stable 2.3 cm low-attenuation lesion in pancreatic tail, which Franta be due to a small pseudocyst or indolent cystic neoplasm such as a side-branch IPMN. Continued imaging follow-up recommended in 1 year probably abdomen MRI without and with contrast. Chronic pancreatitis. No radiographic evidence of acute pancreatitis. Richter hernia in paraumbilical region containing anti mesenteric wall of transverse colon. No evidence of bowel obstruction. Large amount of stool in rectum. Recommend clinical correlation for possible fecal impaction. Diffuse body wall edema. Electronically Signed   By: Danae Orleans M.D.   On: 12/26/2022 16:06   CT L-SPINE NO CHARGE  Result Date: 12/26/2022 CLINICAL DATA:  Left hip pain.  Recent hip replacement. EXAM: CT LUMBAR SPINE WITHOUT CONTRAST TECHNIQUE: Multidetector CT imaging of the lumbar spine was performed without intravenous contrast administration. Multiplanar CT image reconstructions were also generated. RADIATION DOSE REDUCTION: This exam was performed according to the departmental dose-optimization program which includes automated exposure control, adjustment of the mA and/or kV according to patient size and/or use of iterative reconstruction technique. COMPARISON:  Lumbar spine CT 08/17/2020 FINDINGS: Segmentation: 5 lumbar type vertebrae. Alignment: Mild lumbar levoscoliosis. Unchanged trace anterolisthesis of L2 on L3 and L4 on L5. Vertebrae: Chronic, severe, previously augmented T12 compression fracture, unchanged. No acute fracture or suspicious osseous lesion. The bones appear diffusely osteopenic. Paraspinal and other soft tissues: No acute abnormality identified in the paraspinal soft tissues. Remainder of the abdomen and pelvis reported separately. Disc levels: Unchanged appearance of disc and facet  degeneration compared to the prior CT without high-grade spinal stenosis. 8 mm retropulsion of the T12 vertebral body results in borderline spinal stenosis. Right eccentric disc bulging, endplate spurring, severe disc space height loss,  and facet arthrosis at L4-5 result in moderate right neural foraminal stenosis. IMPRESSION: 1. No acute osseous abnormality. 2. Chronic T12 compression fracture. 3. Unchanged lumbar disc and facet degeneration with moderate right neural foraminal stenosis at L4-5. Electronically Signed   By: Sebastian Ache M.D.   On: 12/26/2022 15:49   DG Knee Complete 4 Views Left  Result Date: 12/26/2022 CLINICAL DATA:  Pain. EXAM: LEFT KNEE - COMPLETE 4 VIEW COMPARISON:  None Available. FINDINGS: Osteopenia. Small osteophytes of all 3 compartments. No joint effusion on lateral view. No fracture or dislocation. IMPRESSION: Osteopenia with degenerative changes. Electronically Signed   By: Karen Kays M.D.   On: 12/26/2022 13:55   DG Knee Complete 4 Views Right  Result Date: 12/26/2022 CLINICAL DATA:  Knee pain EXAM: RIGHT KNEE - COMPLETE 4 VIEW COMPARISON:  None Available. FINDINGS: Osteopenia. No fracture or dislocation. Small osteophytes seen of all 3 compartments. No joint effusion. IMPRESSION: Osteopenia.  Slight degenerative change. Electronically Signed   By: Karen Kays M.D.   On: 12/26/2022 13:54   DG Hip Unilat W or Wo Pelvis 2-3 Views Left  Result Date: 12/26/2022 CLINICAL DATA:  Low back pain EXAM: DG HIP (WITH OR WITHOUT PELVIS) 3V LEFT COMPARISON:  12/09/2022.  12/12/2022 FINDINGS: No fracture or dislocation bilateral hip arthroplasty seen with Press-Fit components. The right femoral stem is not included in the imaging field of the left hip exam. There is also a cerclage wire on the left. Overlapping skin staples. No hardware failure. No fracture or dislocation. Vascular calcifications IMPRESSION: Osteopenia. Bilateral hip arthroplasties. Left-side femoral stem has a  cerclage wire as well as overlapping skin staples. Please correlate with time course of surgery Electronically Signed   By: Karen Kays M.D.   On: 12/26/2022 13:46   DG Lumbar Spine Complete  Result Date: 12/26/2022 CLINICAL DATA:  Pain EXAM: LUMBAR SPINE - COMPLETE 5 VIEW COMPARISON:  None Available. FINDINGS: Diffuse are somewhat limited due to technique and patient's biliary tolerate the exam lateral views are extremely limited. Severe osteopenia. Preserved vertebral body height. Trace anterolisthesis suggested at L4-5 with moderate disc height loss. Prominent lower lumbar facet degenerative changes suggested. No spondylolysis. Augmentation cement seen of the spine at T12. Please correlate with history. Bilateral hip arthroplasties. Overlapping cardiac leads. IMPRESSION: Limited examination. Osteopenia with degenerative changes greatest at L4-5. Trace anterolisthesis at L4-5 Augmentation cement at T12. Electronically Signed   By: Karen Kays M.D.   On: 12/26/2022 13:42   DG HIP UNILAT WITH PELVIS 1V LEFT  Result Date: 12/12/2022 CLINICAL DATA:  Left hip arthroplasty EXAM: DG HIP (WITH OR WITHOUT PELVIS) 1V*L* COMPARISON:  Pelvis radiographs 12/09/2022 FINDINGS: Two C-arm fluoroscopic images were obtained intraoperatively and submitted for post operative interpretation. Postsurgical changes reflecting left hip arthroplasty with cerclage wire fixation are noted. There is no evidence of immediate complication. Fluoro time 28 seconds, dose 1.89 mGy. Please see the performing provider's procedural report for further detail. IMPRESSION: Status post left hip arthroplasty without evidence of immediate complication. Electronically Signed   By: Lesia Hausen M.D.   On: 12/12/2022 15:17   DG Pelvis Portable  Result Date: 12/12/2022 CLINICAL DATA:  Status post hip surgery EXAM: PORTABLE PELVIS 1-2 VIEWS COMPARISON:  Pelvis radiographs 06/11/2022 FINDINGS: There has been interval left hip arthroplasty with  cerclage wire fixation. Hardware alignment is within expected limits, without evidence of complication. There is expected surrounding postoperative soft tissue gas. Right hip arthroplasty hardware is stable, without evidence of complication. The SI  joints and symphysis pubis are intact. IMPRESSION: Status post left hip arthroplasty without evidence of complication. Electronically Signed   By: Lesia Hausen M.D.   On: 12/12/2022 15:15   DG C-Arm 1-60 Min-No Report  Result Date: 12/12/2022 Fluoroscopy was utilized by the requesting physician.  No radiographic interpretation.   DG C-Arm 1-60 Min-No Report  Result Date: 12/12/2022 Fluoroscopy was utilized by the requesting physician.  No radiographic interpretation.   DG Knee Left Port  Result Date: 12/11/2022 CLINICAL DATA:  Closed displaced fracture of left femoral neck after fall. Left knee pain. EXAM: PORTABLE LEFT KNEE - 1-2 VIEW COMPARISON:  Left knee radiographs 05/05/2022 FINDINGS: Diffuse decreased bone mineralization. Moderate to severe medial compartment joint space narrowing. Mild lateral compartment joint space narrowing. Mild-to-moderate peripheral degenerative spurring. Severe patellofemoral joint space narrowing. No acute fracture is seen. No dislocation. IMPRESSION: 1. No acute fracture is seen. 2. 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 Electronically Signed   By: Neita Garnet M.D.   On: 12/11/2022 13:44   ECHOCARDIOGRAM COMPLETE  Result Date: 12/10/2022    ECHOCARDIOGRAM REPORT   Patient Name:   Shirley S Arias  Date of Exam: 12/10/2022 Medical Rec #:  [id number]  Height:       67.0 in Accession #:    [id number] Weight:       143.3 lb Date of Birth:  10/03/1929 BSA:          1.755 m Patient Age:    89 years   BP:           148/65 mmHg Patient Gender: F          HR:           70 bpm. Exam Location:  Inpatient Procedure: 2D Echo, Cardiac Doppler and Color Doppler Indications:    Preoperative evaluation, abnormal EKG  History:         Patient has prior history of Echocardiogram examinations, most                 recent 12/14/2019. Carotid Disease; Risk Factors:Dyslipidemia,                 Hypertension and Diabetes. Seizure disorderm hx cancer,                 respiratory failure, hx PE.  Sonographer:    Wallie Char Referring Phys: [id number] Rose Hegner MANUEL ORTIZ IMPRESSIONS  1. Left ventricular ejection fraction, by estimation, is 65 to 70%. The left ventricle has normal function. The left ventricle has no regional wall motion abnormalities. Left ventricular diastolic parameters are consistent with Grade I diastolic dysfunction (impaired relaxation).  2. LV intracavitary gradient of 21 mmHg due to hyperdynamic ventricle.  3. Right ventricular systolic function is normal. The right ventricular size is normal. Tricuspid regurgitation signal is inadequate for assessing PA pressure.  4. The mitral valve is grossly normal. Trivial mitral valve regurgitation. No evidence of mitral stenosis.  5. The aortic valve is tricuspid. There is mild calcification of the aortic valve. Aortic valve regurgitation is not visualized. No aortic stenosis is present. FINDINGS  Left Ventricle: Left ventricular ejection fraction, by estimation, is 65 to 70%. The left ventricle has normal function. The left ventricle has no regional wall motion abnormalities. The left ventricular internal cavity size was normal in size. There is  no left ventricular hypertrophy. Left ventricular diastolic parameters are consistent with Grade I diastolic dysfunction (impaired relaxation). Right Ventricle: The right ventricular size is normal. No  increase in right ventricular wall thickness. Right ventricular systolic function is normal. Tricuspid regurgitation signal is inadequate for assessing PA pressure. Left Atrium: Left atrial size was normal in size. Right Atrium: Right atrial size was normal in size. Pericardium: There is no evidence of pericardial effusion. Presence of epicardial  fat layer. Mitral Valve: The mitral valve is grossly normal. Trivial mitral valve regurgitation. No evidence of mitral valve stenosis. MV peak gradient, 3.7 mmHg. The mean mitral valve gradient is 2.0 mmHg. Tricuspid Valve: The tricuspid valve is normal in structure. Tricuspid valve regurgitation is trivial. No evidence of tricuspid stenosis. Aortic Valve: The aortic valve is tricuspid. There is mild calcification of the aortic valve. Aortic valve regurgitation is not visualized. No aortic stenosis is present. Aortic valve mean gradient measures 4.0 mmHg. Aortic valve peak gradient measures 7.7 mmHg. Aortic valve area, by VTI measures 2.21 cm. Pulmonic Valve: The pulmonic valve was not well visualized. Pulmonic valve regurgitation is not visualized. No evidence of pulmonic stenosis. Aorta: The aortic root is normal in size and structure. Venous: The inferior vena cava was not well visualized. IAS/Shunts: No atrial level shunt detected by color flow Doppler.  LEFT VENTRICLE PLAX 2D LVIDd:         3.90 cm     Diastology LVIDs:         2.40 cm     LV e' medial:    7.62 cm/s LV PW:         0.80 cm     LV E/e' medial:  9.5 LV IVS:        1.30 cm     LV e' lateral:   6.75 cm/s LVOT diam:     1.90 cm     LV E/e' lateral: 10.8 LV SV:         58 LV SV Index:   33 LVOT Area:     2.84 cm  LV Volumes (MOD) LV vol d, MOD A2C: 56.5 ml LV vol d, MOD A4C: 63.0 ml LV vol s, MOD A2C: 14.4 ml LV vol s, MOD A4C: 16.3 ml LV SV MOD A2C:     42.1 ml LV SV MOD A4C:     63.0 ml LV SV MOD BP:      44.1 ml RIGHT VENTRICLE RV Basal diam:  2.60 cm RV S prime:     14.80 cm/s TAPSE (M-mode): 1.5 cm LEFT ATRIUM             Index        RIGHT ATRIUM          Index LA diam:        4.80 cm 2.73 cm/m   RA Area:     9.31 cm LA Vol (A2C):   45.7 ml 26.04 ml/m  RA Volume:   15.80 ml 9.00 ml/m LA Vol (A4C):   54.1 ml 30.82 ml/m LA Biplane Vol: 50.8 ml 28.94 ml/m  AORTIC VALVE AV Area (Vmax):    2.12 cm AV Area (Vmean):   2.15 cm AV Area (VTI):      2.21 cm AV Vmax:           139.00 cm/s AV Vmean:          97.600 cm/s AV VTI:            0.262 m AV Peak Grad:      7.7 mmHg AV Mean Grad:      4.0 mmHg LVOT Vmax:  104.00 cm/s LVOT Vmean:        73.950 cm/s LVOT VTI:          0.205 m LVOT/AV VTI ratio: 0.78  AORTA Ao Root diam: 3.30 cm Ao Asc diam:  3.30 cm MITRAL VALVE MV Area (PHT): 2.36 cm    SHUNTS MV Area VTI:   2.22 cm    Systemic VTI:  0.20 m MV Peak grad:  3.7 mmHg    Systemic Diam: 1.90 cm MV Mean grad:  2.0 mmHg MV Vmax:       0.97 m/s MV Vmean:      56.3 cm/s MV Decel Time: 322 msec MV E velocity: 72.70 cm/s MV A velocity: 92.40 cm/s MV E/A ratio:  0.79 Weston Brass MD Electronically signed by Weston Brass MD Signature Date/Time: 12/10/2022/2:26:30 PM    Final    DG Chest 1 View  Result Date: 12/09/2022 CLINICAL DATA:  Pain after fall EXAM: CHEST  1 VIEW COMPARISON:  12/21/2021 FINDINGS: No consolidation, pneumothorax or effusion. No edema. Normal cardiopericardial silhouette. Tortuous aorta. Overlapping cardiac leads. Surgical clips in the right axillary region. IMPRESSION: No acute cardiopulmonary disease. Electronically Signed   By: Karen Kays M.D.   On: 12/09/2022 14:42   DG Hip Unilat With Pelvis 2-3 Views Left  Result Date: 12/09/2022 CLINICAL DATA:  Pain after fall EXAM: DG HIP (WITH OR WITHOUT PELVIS) 3V LEFT COMPARISON:  X-ray 06/11/2022 FINDINGS: There is a minimally displaced subcapital femoral neck fracture of the left hip. Osteopenia. Right hemiarthroplasty identified. Expected alignment. The upper aspect of the pelvis is clipped off the edge of the film. IMPRESSION: Mm displaced subcapital femoral neck fracture of the left hip. Osteopenia. Right hip hemiarthroplasty Electronically Signed   By: Karen Kays M.D.   On: 12/09/2022 14:41     Time coordinating discharge: Over 30 minutes    Lewie Chamber, MD  [facility] 01/03/2023, 12:21 PM

## 2023-01-03 NOTE — Plan of Care (Signed)
  Problem: Education: Goal: Knowledge of the prescribed therapeutic regimen will improve Outcome: Progressing Goal: Understanding of discharge needs will improve Outcome: Progressing Goal: Individualized Educational Video(s) Outcome: Progressing   Problem: Activity: Goal: Ability to avoid complications of mobility impairment will improve Outcome: Progressing   Problem: Activity: Goal: Ability to tolerate increased activity will improve Outcome: Progressing   Problem: Skin Integrity: Goal: Will show signs of wound healing Outcome: Progressing   Problem: Safety: Goal: Ability to remain free from injury will improve Outcome: Progressing   Problem: Pain Managment: Goal: General experience of comfort will improve Outcome: Progressing

## 2023-01-03 NOTE — Plan of Care (Signed)
Problem: Education: Goal: Knowledge of the prescribed therapeutic regimen will improve Outcome: Adequate for Discharge Goal: Understanding of discharge needs will improve Outcome: Adequate for Discharge Goal: Individualized Educational Video(s) Outcome: Adequate for Discharge   Problem: Activity: Goal: Ability to avoid complications of mobility impairment will improve Outcome: Adequate for Discharge Goal: Ability to tolerate increased activity will improve Outcome: Adequate for Discharge   Problem: Clinical Measurements: Goal: Postoperative complications will be avoided or minimized Outcome: Adequate for Discharge   Problem: Pain Management: Goal: Pain level will decrease with appropriate interventions Outcome: Adequate for Discharge   Problem: Skin Integrity: Goal: Will show signs of wound healing Outcome: Adequate for Discharge   Problem: Education: Goal: Knowledge of General Education information will improve Description: Including pain rating scale, medication(s)/side effects and non-pharmacologic comfort measures Outcome: Adequate for Discharge   Problem: Health Behavior/Discharge Planning: Goal: Ability to manage health-related needs will improve Outcome: Adequate for Discharge   Problem: Clinical Measurements: Goal: Ability to maintain clinical measurements within normal limits will improve Outcome: Adequate for Discharge Goal: Will remain free from infection Outcome: Adequate for Discharge Goal: Diagnostic test results will improve Outcome: Adequate for Discharge Goal: Respiratory complications will improve Outcome: Adequate for Discharge Goal: Cardiovascular complication will be avoided Outcome: Adequate for Discharge   Problem: Clinical Measurements: Goal: Ability to maintain clinical measurements within normal limits will improve Outcome: Adequate for Discharge Goal: Will remain free from infection Outcome: Adequate for Discharge Goal: Diagnostic test  results will improve Outcome: Adequate for Discharge Goal: Respiratory complications will improve Outcome: Adequate for Discharge Goal: Cardiovascular complication will be avoided Outcome: Adequate for Discharge   Problem: Activity: Goal: Risk for activity intolerance will decrease Outcome: Adequate for Discharge   Problem: Nutrition: Goal: Adequate nutrition will be maintained Outcome: Adequate for Discharge   Problem: Coping: Goal: Level of anxiety will decrease Outcome: Adequate for Discharge   Problem: Elimination: Goal: Will not experience complications related to bowel motility Outcome: Adequate for Discharge Goal: Will not experience complications related to urinary retention Outcome: Adequate for Discharge   Problem: Pain Managment: Goal: General experience of comfort will improve Outcome: Adequate for Discharge   Problem: Safety: Goal: Ability to remain free from injury will improve Outcome: Adequate for Discharge   Problem: Skin Integrity: Goal: Risk for impaired skin integrity will decrease Outcome: Adequate for Discharge   Problem: Education: Goal: Ability to describe self-care measures that Jeffrey prevent or decrease complications (Diabetes Survival Skills Education) will improve Outcome: Adequate for Discharge Goal: Individualized Educational Video(s) Outcome: Adequate for Discharge   Problem: Coping: Goal: Ability to adjust to condition or change in health will improve Outcome: Adequate for Discharge   Problem: Fluid Volume: Goal: Ability to maintain a balanced intake and output will improve Outcome: Adequate for Discharge   Problem: Health Behavior/Discharge Planning: Goal: Ability to identify and utilize available resources and services will improve Outcome: Adequate for Discharge Goal: Ability to manage health-related needs will improve Outcome: Adequate for Discharge   Problem: Skin Integrity: Goal: Risk for impaired skin integrity will  decrease Outcome: Adequate for Discharge   Problem: Tissue Perfusion: Goal: Adequacy of tissue perfusion will improve Outcome: Adequate for Discharge   Problem: Acute Rehab OT Goals (only OT should resolve) Goal: Pt. Will Perform Grooming Outcome: Adequate for Discharge Goal: Pt. Will Perform Lower Body Dressing Outcome: Adequate for Discharge Goal: Pt. Will Transfer To Toilet Outcome: Adequate for Discharge Goal: Pt/Caregiver Will Perform Home Exercise Program Outcome: Adequate for Discharge Goal: OT Additional ADL Goal #  1 Outcome: Adequate for Discharge   Problem: Acute Rehab PT Goals(only PT should resolve) Goal: Pt will Roll Supine to Side Outcome: Adequate for Discharge Goal: Pt Will Go Supine/Side To Sit Outcome: Adequate for Discharge Goal: Pt Will Go Sit To Supine/Side Outcome: Adequate for Discharge Goal: Patient Will Perform Sitting Balance Outcome: Adequate for Discharge Goal: Patient Will Transfer Sit To/From Stand Outcome: Adequate for Discharge   Problem: Acute Rehab PT Goals(only PT should resolve) Goal: Pt Will Perform Standing Balance Or Pre-Gait Outcome: Adequate for Discharge

## 2023-01-03 NOTE — Progress Notes (Signed)
Patient d/c to [facility], report called spoke to nurse Y. Patient transported by [facility] family at bedside at time of d/c.   Christeen Lai, Kae Heller, RN

## 2023-01-19 ENCOUNTER — Telehealth: Payer: Self-pay | Admitting: Neurology

## 2023-01-19 NOTE — Telephone Encounter (Signed)
She is deconditioned per last hospital admission. Increasing her Primidone can make her general condition worse. We will hold on increasing any medication until our visit. Thanks

## 2023-01-19 NOTE — Telephone Encounter (Signed)
Shanda Bumps from [facility] called in to schedule patient for a sooner appointment. Stated Provider there evaluated patient and has noticed increased/worsening tremors and want to get patient in sooner. Per last office note-"Advised her to contact me if she does have side effect of the medicine or if the medication is not enough to control her symptoms." Please advise, I did go ahead and add patient to wait list. Can call Shanda Bumps at facility back at [phone]

## 2023-01-19 NOTE — Telephone Encounter (Signed)
Call back to [facility] facility, made aware of Dr. Teresa Coombs recommendation. Also advised she could access epic for those record for their facility staff and MD for their review.

## 2023-01-19 NOTE — Telephone Encounter (Signed)
Call back to Shanda Bumps, she says facility MD evaluated patient last week and tremors are getting worse. She would like patient to be evaluated and patient appt. Was added to wait list. Advised I would send to Dr. Teresa Coombs for further advice or recommendations.

## 2023-01-21 ENCOUNTER — Ambulatory Visit (INDEPENDENT_AMBULATORY_CARE_PROVIDER_SITE_OTHER): Payer: Medicare Other | Admitting: Neurology

## 2023-01-21 ENCOUNTER — Encounter: Payer: Self-pay | Admitting: Neurology

## 2023-01-21 VITALS — BP 127/85 | HR 96 | Resp 15 | Ht 67.0 in | Wt 151.0 lb

## 2023-01-21 DIAGNOSIS — G250 Essential tremor: Secondary | ICD-10-CM

## 2023-01-21 DIAGNOSIS — D649 Anemia, unspecified: Secondary | ICD-10-CM | POA: Diagnosis not present

## 2023-01-21 DIAGNOSIS — G40909 Epilepsy, unspecified, not intractable, without status epilepticus: Secondary | ICD-10-CM | POA: Diagnosis not present

## 2023-01-21 MED ORDER — PROPRANOLOL HCL 10 MG PO TABS: 10.0000 mg | ORAL_TABLET | Freq: Three times a day (TID) | ORAL | 0 refills | Status: DC

## 2023-01-21 NOTE — Progress Notes (Signed)
[facility]  PATIENT: [name] DOB: 04-14-1930  REQUESTING CLINICIAN: Verlon Au, MD HISTORY FROM: Patient, niece and chart review  REASON FOR VISIT: Seizure   HISTORICAL  CHIEF COMPLAINT:  Chief Complaint  Patient presents with   Tremors    Rm13, [name] poa niece, Tremors:worsening in bilateral hands would like more medication to help    INTERVAL HISTORY 01/22/2023 Patient presents for follow-up, she is accompanied by her niece [name].  Her last visit was in [place] at that time we increased the primidone to 250 in the morning and 150 in the evening to control her essential tremor symptoms.  We also continued her phenytoin XR 300 mg nightly for the seizures.  They tell me no seizure or seizure-like activity but the tremors are getting worse.  She is having extreme difficulty eating her breakfast to the point that her oral intake has decreased.  When asking about medication, she tells me that the morning dose of her primidone is given around 10 AM after breakfast and the evening dose around 10 PM when she is ready to go to bed.  Again she is having extreme difficulty eating and now with fine movement.   INTERVAL HISTORY 09/21/2022:  [name] presents today for follow-up, she is accompanied by her niece [name].  Last visit was in October, since then she has not had any seizure or seizure-like activity.  She is compliant with the phenytoin XR 300 mg nightly.  In terms of the essential tremor, she reports that sometimes in the evening she has worsening of her symptoms.  She is on primidone 250 in the morning and 50 mg in the evening but sometimes reports in the evening she can take up to 150 mg of primidone to control her symptoms.  She continued to have gait abnormality and reports recent falls for which she had to call EMS.  Denies hitting her head.  Overall she is stable.  She is independent but limited by her gait abnormality and overall weakness.   HISTORY  OF PRESENT ILLNESS:  This is a 89 year old woman with multiple medical conditions including seizure disorder, essential tremor, type 2 diabetes mellitus, hypertension, right frontal meningioma s/p resection in 2015, colon cancer who is presenting for follow-up for seizure disorder.  Patient was previously seen by Dr. Anne Hahn and Dr. Marjory Lies for her seizures.  His last visit with Dr. Danae Orleans was in February 2023, at that time plan was to continue with phenytoin 200 mg in the evening and also primidone 250 mg in the morning for her essential tremor.  In August she was admitted to the hospital for seizure work-up versus syncope.  Patient reports she was at home with a friend and then passed out on the couch, the friend could not arouse her and therefore she called EMS.  In the ED, was noted to be hyponatremic to 129, was observed in the emergency department and her condition continued to improve after IV hydration.  On discharge, she was recommended to increase her Dilantin to 300 mg.  Since her hospitalization in August she has not had any additional seizures.  She did present to the hospital however for small bowel obstruction.  In terms of the seizure we have checked her Dilantin which was low and plan was to increase it to 350 but due to miscommunication patient is actually taking 250 mg of Dilantin nightly, again no seizures.  In terms of the tremors, she reports that tremors are still bothersome  with eating, she spilled her food has difficulty using a fork she is currently on primidone to 50 in the morning    PRIOR HPI (12/24/20; Dr. Anne Hahn): "[name] is an 89 year old right-handed white female with a history of a seizure disorder, her last seizure was on 19 Soy 2022.  The patient was in an extended care facility at that time and she is not completely sure that she was getting her medications properly.  She has a chronic gait disorder, she has fallen on occasion, she has not fallen recently.  She uses a  walker to get around.  She has an essential tremor that affects her ability to feed herself at times.  She is on Mysoline taking 150 mg in the morning and 100 mg in the evening.  She has history of diabetes.  She continues to be concerned about the tremor.  She wishes to have something done about this issue.  She questions whether ultrasound ablation of the brain Shen be something she could consider."   PRIOR HPI (08/15/14, VRP): 89 year old female here for evaluation of tongue laceration and possible seizures. In 2015 patient was having intermittent episodes of falling down. There is question of whether she had unresponsiveness with these events. She was initially evaluated for syncopal workup with heart testing. Ultimately she was admitted to the hospital in June 2015 for confusion, altered mental status, recurrent falls. She's found to have a large right frontal meningioma and right subdural hematoma. Patient was treated with surgical resection, Decadron and antiseizure medication. Neurology consult was obtained who felt patient was also having intermittent complex partial seizures. She was discharged on Keppra 500 mg twice a day. 2 weeks after discharge patient stopped his medication on her own due to side effects of jitteriness, dizziness.   Patient continues to live on her own, continues to have intermittent falls and confusion spells. In 07/18/2014 patient woke up with large amount of blood on her pillow and bed sheets. She had a severe tongue laceration. No muscle aches, incontinence, confusion. The next day patient went to the hospital for kyphoplasty procedure. On 07/20/2014 patient went to the emergency room for evaluation of possible infection of tongue laceration. She followed up with ENT Dr. Pollyann Kennedy. Due to history of meningioma and seizures, patient was referred back to me for consultation.   Patient still drives, short distances to church and grocery store. She is also had lower extremity weakness  and numbness. She has diabetes   Previous ASMs: Levetiracetam, Lamotrigine, Carbamazepine, Topiramate   Currenty ASMs: Phenytoin 250 mg (she should be taking 350 mg), Primidone 250 mg     OTHER MEDICAL CONDITIONS: Seizure disorder, essential tremor, type 2 diabetes mellitus, hypertension, right frontal meningioma s/p resection in 2015, colon cancer  REVIEW OF SYSTEMS: Full 14 system review of systems performed and negative with exception of: As noted in the HPI   ALLERGIES: Allergies  Allergen Reactions   Bystolic [Nebivolol Hcl] Other (See Comments)    "extreme weakness, heaviness in legs & arms, swelling in legs/arms/face, swollen abdomen, pain in bladder, feet pain, soreness in chest"   Carbamazepine Other (See Comments)    Blood poisoning   Cholestyramine Itching, Nausea And Vomiting, Rash and Other (See Comments)    "itching rash on stomach, bloated, nausea, vomiting, sleeplessness, extreme pain in arms"  Patient is taking this in 2022.   Morphine Other (See Comments)    Feels morbid, weak, still in pain   Niacin Palpitations    Fast heart  beat Other reaction(s): Unknown   Norvasc [Amlodipine Besylate] Other (See Comments)    "extreme fluid retention/pain)   Optivar [Azelastine Hcl] Photosensitivity   Repaglinide Hives   Sular [Nisoldipine Er] Other (See Comments)    "severe headaches, swelling eyes, hands, feet, shortness of breath, weak, flushed face, brain boiling, fluid retention, high blood sugar, nervous, heart fast beating"   Telmisartan Other (See Comments)    "headache, difficulty urinating, high blood sugar, fluid retention"   Amoxicillin-Pot Clavulanate Rash    Tolerates Unasyn (>10 doses)   Cefdinir Swelling    Vaginal irritation, breathing,    Ciprofloxacin Hcl Hives   Clonidine Other (See Comments)    Dry mouth, fluid retention   Codeine Nausea And Vomiting   Ezetimibe Other (See Comments)    Made weak   Hydroxychloroquine Other (See Comments)    Low  platlets   Naproxen Other (See Comments)    Shrinks bladder   Sulfa Antibiotics Rash   Ziac [Bisoprolol-Hydrochlorothiazide] Other (See Comments)    "stopped urination"   Amlodipine Besy-Benazepril Hcl Cough   Azelastine Other (See Comments)    Unknown reaction - Per MAR   Elavil [Amitriptyline] Other (See Comments)    Gave Pt nightmares   Empagliflozin Other (See Comments)    "Caused yeast infection, slowed my urine"   Hydralazine Other (See Comments)    "does not reduce high blood pressure, pain in arm, high pressure, felt like I was on verge of heart attack, really weak"   Iodine I 131 Tositumomab Other (See Comments)    Unknown reaction - Per MAR   Kenalog [Triamcinolone] Diarrhea    Per MAR   Keppra [Levetiracetam] Other (See Comments)    Shaking   Lamotrigine Itching and Other (See Comments)   Pregabalin Swelling and Other (See Comments)    Weight gain   Pseudoephedrine Other (See Comments)    Unknown reaction   Risedronate     Unknown reaction - Per MAR   Ru-Hist D [Brompheniramine-Phenylephrine] Other (See Comments)    Unknown reaction - Per MAR   Sumatriptan Other (See Comments)   Topiramate Other (See Comments)    Dry eyes   Ace Inhibitors Other (See Comments)    unknown   Aspirin Other (See Comments)    Unknown reaction   Atacand [Candesartan] Other (See Comments)    Unknown reaction - MAR   Bextra [Valdecoxib] Other (See Comments)    Unknown reaction - Per MAR   Bisoprolol-Hydrochlorothiazide Other (See Comments)    Unknown reaction - Per MAR   Cefadroxil Other (See Comments)    Unknown reaction   Celecoxib Rash   Hydrocodone Other (See Comments)    unknown   Iodinated Contrast Media Rash and Other (See Comments)    "All over" rash   Meloxicam Other (See Comments)    unknown   Methylprednisolone Sodium Succinate Other (See Comments)    unknown   Nabumetone Other (See Comments)    Unknown reaction   Penicillins Other (See Comments)    unknown    Pseudoephedrine-Guaifenesin Other (See Comments)    unknown   Rofecoxib Other (See Comments)    Unknown reaction - MAR Other reaction(s): Unknown   Ru-Tuss [Chlorphen-Pse-Atrop-Hyos-Scop] Other (See Comments)    unknown   Sulfonamide Derivatives Other (See Comments)    unknown   Sulphur [Elemental Sulfur] Other (See Comments)    unknown   Telithromycin Other (See Comments)    unknown   Terfenadine Other (See Comments)  Unknown reaction - Per MAR   Trandolapril-Verapamil Hcl Er Other (See Comments)    Headache, difficulty urinating, high blood sugar, fluid retention  Pt is taking Tarka (trandolapril-verapamil) currently, but requests the medication stay in her allergy list   Valium [Diazepam] Other (See Comments)    Makes her mean and hyper    HOME MEDICATIONS: Outpatient Medications Prior to Visit  Medication Sig Dispense Refill   Acetaminophen 325 MG CAPS Take 650 mg by mouth 3 (three) times daily.     apixaban (ELIQUIS) 2.5 MG TABS tablet Take 1 tablet (2.5 mg total) by mouth 2 (two) times daily. 60 tablet 0   Blood Glucose Monitoring Suppl (PRODIGY VOICE BLOOD GLUCOSE) w/Device KIT Use to check blood sugar 1 time per day. 1 each 2   Cholecalciferol (VITAMIN D-3) 125 MCG (5000 UT) TABS Take 5,000 Units by mouth daily.     cholestyramine light (PREVALITE) 4 g packet Take 1 packet (4 g total) by mouth 2 (two) times daily.     diclofenac Sodium (VOLTAREN) 1 % GEL Apply 2 g topically 4 (four) times daily as needed (for knee and back pain).     insulin glargine (LANTUS SOLOSTAR) 100 UNIT/ML Solostar Pen Inject 10 Units into the skin at bedtime. (Patient taking differently: Inject 13 Units into the skin at bedtime.)     insulin lispro (HUMALOG) 100 UNIT/ML injection Inject 0-12 Units into the skin 3 (three) times daily before meals.     Insulin Pen Needle 32G X 4 MM MISC Used to inject insulin 3x daily (Patient taking differently: 1 each by Other route See admin instructions. Used to  inject insulin 3x daily) 270 each 2   levothyroxine (SYNTHROID) 75 MCG tablet Take 1 tablet (75 mcg total) by mouth daily before breakfast. 30 tablet 0   lidocaine (HM LIDOCAINE PATCH) 4 % Place 2 patches onto the skin daily.     loperamide (IMODIUM A-D) 2 MG tablet Take 2 mg by mouth 4 (four) times daily as needed for diarrhea or loose stools.     Menthol, Topical Analgesic, (BIOFREEZE COOL THE PAIN) 4 % GEL Apply 1 g topically 3 (three) times daily.     oxyCODONE (OXY IR/ROXICODONE) 5 MG immediate release tablet Take 0.5 tablets (2.5 mg total) by mouth every 6 (six) hours as needed for breakthrough pain. 20 tablet 0   pantoprazole (PROTONIX) 40 MG tablet Take 40 mg by mouth daily.     PHENYTEK 300 MG ER capsule TAKE 1 CAPSULE AT BEDTIME 90 capsule 3   Polyethyl Glycol-Propyl Glycol (SYSTANE) 0.4-0.3 % SOLN Place 2 drops into both eyes 3 (three) times daily.     polyethylene glycol (MIRALAX / GLYCOLAX) 17 g packet Take 17 g by mouth daily as needed. (Patient taking differently: Take 17 g by mouth daily as needed for mild constipation.) 14 each 0   primidone (MYSOLINE) 250 MG tablet Take 1 tablet (250 mg total) by mouth daily. 90 tablet 3   primidone (MYSOLINE) 50 MG tablet Take 3 tablets (150 mg total) by mouth at bedtime. 270 tablet 3   thiamine 250 MG tablet Take 250 mg by mouth daily.     Verapamil HCl CR 200 MG CP24 Take 200 mg by mouth daily.     No facility-administered medications prior to visit.    PAST MEDICAL HISTORY: Past Medical History:  Diagnosis Date   Anemia    Anxiety    Asthma    Breast cancer (HCC) 2014  right breast   Cancer of right breast (HCC) 12/26/2012   right breast 12:00 o'clock, DCIS   Carotid artery disease (HCC)    Carpal tunnel syndrome, bilateral    Chronic bronchitis (HCC)    Chronic cough    Chronic diarrhea    3 years   Chronic facial pain    Chronic foot pain    Colon cancer (HCC) dx'd 11/2019   Complication of anesthesia    Sore jaw; could  not chew or move mouth, prolonged sedation   Convulsions/seizures (HCC) 10/16/2014   Diabetes mellitus    type 2 niddm x 20 years   Dyslipidemia    Ejection fraction    Gait abnormality 12/04/2019   GERD (gastroesophageal reflux disease)    Hammer toe    bilateral   History of colonic polyps    History of meningioma    HTN (hypertension)    Hx of radiation therapy 03/07/13- 03/29/13   right breast 4250 cGy 17 sessions   Hyperlipidemia    Hypokalemia    Hyponatremia    Hypothyroidism    IBS (irritable bowel syndrome)    Melanoma (HCC)    Metatarsal bone fracture 2014   Multiple drug allergies    Nausea & vomiting 03/12/2020   Nontoxic thyroid nodule    Obesity    Osteoarthritis    Osteoporosis    Palpitations    Personal history of radiation therapy 2014   Seasonal allergies    Skin cancer    Syncope    Tremor, essential 08/18/2016   Vitamin B12 deficiency    Vitamin D deficiency     PAST SURGICAL HISTORY: Past Surgical History:  Procedure Laterality Date   ABDOMINAL HYSTERECTOMY     BIOPSY  11/07/2019   Procedure: BIOPSY;  Surgeon: Bernette Redbird, MD;  Location: [facility];  Service: Endoscopy;;   BRAIN SURGERY     BREAST BIOPSY Right 01/24/2013   Procedure: RE-EXCICION OF BREAST CANCER, ANTERIOR MARGINS;  Surgeon: Mariella Saa, MD;  Location: [facility];  Service: General;  Laterality: Right;   BREAST LUMPECTOMY Right 2014   BREAST LUMPECTOMY WITH NEEDLE LOCALIZATION Right 01/17/2013   Procedure: BREAST LUMPECTOMY WITH NEEDLE LOCALIZATION;  Surgeon: Mariella Saa, MD;  Location: [facility];  Service: General;  Laterality: Right;   BUNIONECTOMY Bilateral    CATARACT EXTRACTION W/ INTRAOCULAR LENS IMPLANT Right    CHOLECYSTECTOMY     COLONOSCOPY     COLONOSCOPY WITH PROPOFOL N/A 11/07/2019   Procedure: COLONOSCOPY WITH PROPOFOL;  Surgeon: Bernette Redbird, MD;  Location: [facility];  Service: Endoscopy;  Laterality: N/A;   CRANIOTOMY Right 10/18/2013    Procedure: CRANIOTOMY TUMOR EXCISION;  Surgeon: Karn Cassis, MD;  Location: [facility];  Service: Neurosurgery;  Laterality: Right;   ESOPHAGOGASTRODUODENOSCOPY (EGD) WITH PROPOFOL N/A 11/07/2019   Procedure: ESOPHAGOGASTRODUODENOSCOPY (EGD) WITH PROPOFOL;  Surgeon: Bernette Redbird, MD;  Location: [facility];  Service: Endoscopy;  Laterality: N/A;   EYE SURGERY     HERNIA REPAIR     IR THORACENTESIS ASP PLEURAL SPACE W/IMG GUIDE  04/01/2020   KNEE ARTHROSCOPY Bilateral    LAPAROSCOPIC RIGHT HEMI COLECTOMY Right 03/06/2020   Procedure: LAPAROSCOPIC RIGHT HEMI COLECTOMY WITH TAP BLOCK AND LYSIS OF ADHESIONS;  Surgeon: Andria Meuse, MD;  Location: [facility];  Service: General;  Laterality: Right;   POLYPECTOMY     small adenomatous   TOTAL HIP ARTHROPLASTY Right 06/05/2020   Procedure: ARTHROPLASTY  ANTERIOR APPROACH. RIGHT HIP;  Surgeon: Swinteck,  Arlys John, MD;  Location: [facility];  Service: Orthopedics;  Laterality: Right;   TOTAL HIP ARTHROPLASTY Left 12/12/2022   Procedure: TOTAL HIP ARTHROPLASTY ANTERIOR APPROACH;  Surgeon: Samson Frederic, MD;  Location: [facility];  Service: Orthopedics;  Laterality: Left;   ULNAR TUNNEL RELEASE      FAMILY HISTORY: Family History  Problem Relation Age of Onset   Heart disease Mother    Osteoporosis Mother    Diabetes Father    Pancreatic cancer Father    Bone cancer Sister    Rectal cancer Sister    Thyroid disease Sister        benign goiter resected   Prostate cancer Brother    Colon cancer Brother    Colon cancer Other     SOCIAL HISTORY: Social History   Socioeconomic History   Marital status: Widowed    Spouse name: Not on file   Number of children: 0   Years of education: college   Highest education level: Not on file  Occupational History   Occupation: retired  Tobacco Use   Smoking status: Never   Smokeless tobacco: Never  Vaping Use   Vaping status: Never Used  Substance and Sexual Activity   Alcohol use: No   Drug  use: No   Sexual activity: Not Currently    Comment: menarche age 34, hysterectomy age 67, HRT x 2-3 mos, G1- miscarriage  Other Topics Concern   Not on file  Social History Narrative   12/24/20 Widowed, lives alone. Has caregivers 8 hrs a day, no one at night- 12/29/21     Ambulates independently, uses walker at home intermittently    Drinks decaf only   Patient is right handed.   Social Determinants of Health   Financial Resource Strain: Not on file  Food Insecurity: No Food Insecurity (12/26/2022)   Hunger Vital Sign    Worried About Running Out of Food in the Last Year: Never true    Ran Out of Food in the Last Year: Never true  Transportation Needs: No Transportation Needs (12/26/2022)   PRAPARE - Administrator, Civil Service (Medical): No    Lack of Transportation (Non-Medical): No  Physical Activity: Not on file  Stress: Not on file  Social Connections: Unknown (09/16/2021)   Received from [facility]   Social Network    Social Network: Not on file  Intimate Partner Violence: Not At Risk (12/26/2022)   Humiliation, Afraid, Rape, and Kick questionnaire    Fear of Current or Ex-Partner: No    Emotionally Abused: No    Physically Abused: No    Sexually Abused: No    PHYSICAL EXAM  GENERAL EXAM/CONSTITUTIONAL: Vitals:  Vitals:   01/21/23 1549  BP: 127/85  Pulse: 96  Resp: 15  Weight: 151 lb (68.5 kg)  Height: 5\' 7"  (1.702 m)   Body mass index is 23.65 kg/m. Wt Readings from Last 3 Encounters:  01/21/23 151 lb (68.5 kg)  12/26/22 143 lb 4.8 oz (65 kg)  12/09/22 143 lb 4.8 oz (65 kg)   Patient is in no distress; well developed, nourished and groomed; neck is supple, appears pale today  MUSCULOSKELETAL: Gait, strength, tone, movements noted in Neurologic exam below  NEUROLOGIC: MENTAL STATUS:     03/28/2018   11:47 AM 08/31/2017   10:44 AM  MMSE - Mini Mental State Exam  Not completed: --   Orientation to time 5 5  Orientation  to Place 4 5  Registration  3 3  Attention/ Calculation 5 4  Recall 2 2  Language- name 2 objects 2 2  Language- repeat 1 1  Language- follow 3 step command 3 3  Language- read & follow direction 1 1  Write a sentence 1 1  Copy design 1 0  Total score 28 27   awake, alert, oriented to person, place and time recent and remote memory intact normal attention and concentration language fluent, comprehension intact, naming intact fund of knowledge appropriate  CRANIAL NERVE:  2nd, 3rd, 4th, 6th - visual fields full to confrontation, extraocular muscles intact, no nystagmus 5th - facial sensation symmetric 7th - facial strength symmetric 8th - hearing intact 9th - palate elevates symmetrically, uvula midline 11th - shoulder shrug symmetric 12th - tongue protrusion midline  MOTOR:  normal bulk and tone, at least antigravity in the BUE and BLE  SENSORY:  normal and symmetric to light touch  COORDINATION:  There is presence of action tremors, right worse then left  GAIT/STATION:  Deferred, using a walker     DIAGNOSTIC DATA (LABS, IMAGING, TESTING) - I reviewed patient records, labs, notes, testing and imaging myself where available.  Lab Results  Component Value Date   WBC 9.6 01/21/2023   HGB 12.5 01/21/2023   HCT 37.9 01/21/2023   MCV 91 01/21/2023   PLT 426 01/21/2023      Component Value Date/Time   NA 133 (L) 01/01/2023 1112   NA 130 (L) 12/24/2020 1143   NA 138 01/04/2013 1212   K 3.2 (L) 01/01/2023 1112   K 3.9 01/04/2013 1212   CL 100 01/01/2023 1112   CO2 26 01/01/2023 1112   CO2 22 01/04/2013 1212   GLUCOSE 243 (H) 01/01/2023 1112   GLUCOSE 285 (H) 01/04/2013 1212   BUN 6 (L) 01/01/2023 1112   BUN 14 12/24/2020 1143   BUN 10.3 01/04/2013 1212   CREATININE 0.51 01/01/2023 1112   CREATININE 0.78 12/01/2013 1659   CREATININE 1.0 01/04/2013 1212   CALCIUM 7.7 (L) 01/01/2023 1112   CALCIUM 9.6 01/04/2013 1212   PROT 5.1 (L) 12/26/2022 1339   PROT  6.3 12/24/2020 1143   PROT 6.4 01/04/2013 1212   ALBUMIN 2.5 (L) 12/26/2022 1339   ALBUMIN 4.2 12/24/2020 1143   ALBUMIN 3.6 01/04/2013 1212   AST 15 12/26/2022 1339   AST 14 01/04/2013 1212   ALT 13 12/26/2022 1339   ALT 16 01/04/2013 1212   ALKPHOS 114 12/26/2022 1339   ALKPHOS 52 01/04/2013 1212   BILITOT 0.5 12/26/2022 1339   BILITOT <0.2 12/24/2020 1143   BILITOT 1.01 01/04/2013 1212   GFRNONAA >60 01/01/2023 1112   GFRAA >60 01/02/2020 0455   Lab Results  Component Value Date   CHOL 148 01/30/2013   HDL 47.10 01/30/2013   LDLCALC 65 01/30/2013   LDLDIRECT 85.7 09/05/2009   TRIG 179.0 (H) 01/30/2013   Lab Results  Component Value Date   HGBA1C 6.9 (H) 10/24/2022   Lab Results  Component Value Date   VITAMINB12 1,197 (H) 12/28/2022   Lab Results  Component Value Date   TSH 2.959 10/24/2022    Phenytoin level 7.9 (01/13/22) 7.4 (01/06/22) 11.3 (12/24/20)    ASSESSMENT AND PLAN  89 y.o. year old female  with seizure disorder, essential tremor, type 2 diabetes mellitus, hypertension, right frontal meningioma s/p resection in 2015, colon cancer who is presenting for follow up for her seizure and essential tremors.  For her seizures, we will continue her on phenytoin  ER 300 mg nightly.    In terms of her essential tremor, we will continue on the same dose of primidone but facility will have to change the time of medication administration.  Recommendation is to give her the morning dose at 8 AM before her breakfast and the evening dose around 4 PM before dinner.  I will also try her on low-dose propranolol 10 mg three time daily; the propranolol can be given with the primidone but if she does have any side effect including dizziness, weakness, lightheadedness, she should stop the propranolol and contact me.  Will also contact the facility to inform them about our recommendations.  Follow-up as scheduled in November.  She was noted to be anemic on her recent hospital  admission and on today's visit, she appears very pale, will get CBC and Iron studies.     1. Seizure disorder (HCC)   2. Essential tremor   3. Anemia, unspecified type      Patient Instructions  Continue with phenytoin extended release 300 mg nightly For essential tremor please give the primidone 250 mg at 8 AM before breakfast and 150 mg in the evening around 4 PM before dinner Start propranolol 10 mg 3 times daily daily, the propranolol can be given with the primidone but if there is any side effect including dizziness, lightheadedness, weakness, please stop the propanolol and contact me Will check CBC and iron study due to recent anemia and patient looking pale today on exam Follow-up in November as scheduled.   Per [facility] statutes, patients with seizures are not allowed to drive until they have been seizure-free for six months.  Other recommendations include using caution when using heavy equipment or power tools. Avoid working on ladders or at heights. Take showers instead of baths.  Do not swim alone.  Ensure the water temperature is not too high on the home water heater. Do not go swimming alone. Do not lock yourself in a room alone (i.e. bathroom). When caring for infants or small children, sit down when holding, feeding, or changing them to minimize risk of injury to the child in the event you have a seizure. Maintain good sleep hygiene. Avoid alcohol.  Also recommend adequate sleep, hydration, good diet and minimize stress.   During the Seizure  - First, ensure adequate ventilation and place patients on the floor on their left side  Loosen clothing around the neck and ensure the airway is patent. If the patient is clenching the teeth, do not force the mouth open with any object as this can cause severe damage - Remove all items from the surrounding that can be hazardous. The patient [place] be oblivious to what's happening and [place] not even know what he or she is doing. If  the patient is confused and wandering, either gently guide him/her away and block access to outside areas - Reassure the individual and be comforting - Call 911. In most cases, the seizure ends before EMS arrives. However, there are cases when seizures [place] last over 3 to 5 minutes. Or the individual [place] have developed breathing difficulties or severe injuries. If a pregnant patient or a person with diabetes develops a seizure, it is prudent to call an ambulance. - Finally, if the patient does not regain full consciousness, then call EMS. Most patients will remain confused for about 45 to 90 minutes after a seizure, so you must use judgment in calling for help. - Avoid restraints but make sure the patient is  in a bed with padded side rails - Place the individual in a lateral position with the neck slightly flexed; this will help the saliva drain from the mouth and prevent the tongue from falling backward - Remove all nearby furniture and other hazards from the area - Provide verbal assurance as the individual is regaining consciousness - Provide the patient with privacy if possible - Call for help and start treatment as ordered by the caregiver   After the Seizure (Postictal Stage)  After a seizure, most patients experience confusion, fatigue, muscle pain and/or a headache. Thus, one should permit the individual to sleep. For the next few days, reassurance is essential. Being calm and helping reorient the person is also of importance.  Most seizures are painless and end spontaneously. Seizures are not harmful to others but can lead to complications such as stress on the lungs, brain and the heart. Individuals with prior lung problems [place] develop labored breathing and respiratory distress.     Orders Placed This Encounter  Procedures   CBC (no diff)   Iron, TIBC and Ferritin Panel    Meds ordered this encounter  Medications   propranolol (INDERAL) 10 MG tablet    Sig: Take 1 tablet (10 mg  total) by mouth 3 (three) times daily for 15 days.    Dispense:  45 tablet    Refill:  0    No follow-ups on file.  I have spent a total of 50 minutes dedicated to this patient today, preparing to see patient, performing a medically appropriate examination and evaluation, ordering tests and/or medications and procedures, and counseling and educating the patient/family/caregiver; independently interpreting result and communicating results to the family/patient/caregiver; and documenting clinical information in the electronic medical record.   Windell Norfolk, MD 01/22/2023, 10:26 AM  [facility] [address] [place], [place] [address] [phone]

## 2023-01-21 NOTE — Telephone Encounter (Signed)
Call to jessica at facility to see about getting patient in with Dr. Teresa Coombs at 3:45 today 9/19.  Left message to return call.

## 2023-01-22 ENCOUNTER — Encounter: Payer: Self-pay | Admitting: Neurology

## 2023-01-22 LAB — CBC
Hematocrit: 37.9 % (ref 34.0–46.6)
Hemoglobin: 12.5 g/dL (ref 11.1–15.9)
MCH: 29.9 pg (ref 26.6–33.0)
MCHC: 33 g/dL (ref 31.5–35.7)
MCV: 91 fL (ref 79–97)
Platelets: 426 10*3/uL (ref 150–450)
RBC: 4.18 x10E6/uL (ref 3.77–5.28)
RDW: 13.9 % (ref 11.7–15.4)
WBC: 9.6 10*3/uL (ref 3.4–10.8)

## 2023-01-22 LAB — IRON,TIBC AND FERRITIN PANEL
Ferritin: 74 ng/mL (ref 15–150)
Iron Saturation: 20 % (ref 15–55)
Iron: 71 ug/dL (ref 27–139)
Total Iron Binding Capacity: 349 ug/dL (ref 250–450)
UIBC: 278 ug/dL (ref 118–369)

## 2023-01-22 NOTE — Progress Notes (Signed)
Please call and advise the patient/facility that her recent blood work did not show evidence of anemia or iron deficiency. No further action is required on these tests at this time. Please remind patient to keep any upcoming appointments or tests and to call us with any interim questions, concerns, problems or updates. Thanks,   Windell Norfolk, MD

## 2023-01-22 NOTE — Patient Instructions (Signed)
Continue with phenytoin extended release 300 mg nightly For essential tremor please give the primidone 250 mg at 8 AM before breakfast and 150 mg in the evening around 4 PM before dinner Start propranolol 10 mg 3 times daily daily, the propranolol can be given with the primidone but if there is any side effect including dizziness, lightheadedness, weakness, please stop the propanolol and contact me Will check CBC and iron study due to recent anemia and patient looking pale today on exam Follow-up in November as scheduled.

## 2023-01-25 ENCOUNTER — Other Ambulatory Visit: Payer: Self-pay

## 2023-01-25 ENCOUNTER — Telehealth: Payer: Self-pay

## 2023-01-25 MED ORDER — PRIMIDONE 50 MG PO TABS: ORAL_TABLET | ORAL | Status: AC

## 2023-01-25 MED ORDER — PRIMIDONE 250 MG PO TABS: ORAL_TABLET | ORAL | Status: AC

## 2023-01-25 NOTE — Telephone Encounter (Signed)
  Faxed to [place], [facility] [phone]

## 2023-01-25 NOTE — Telephone Encounter (Signed)
Discussed with Dr. Teresa Coombs for clarification on primidone. Patient should only be taking primidone  250 mg at 8 am and 150 mg at 4 pm. Call to [place] at [facility], left message to call back. Will also fax information to [place] at [phone]. Will ask for call back to confirm receiving.

## 2023-01-25 NOTE — Telephone Encounter (Signed)
Received call back from [employer] at [facility]. Reviewed new Primidone dosing and administration and she verbalized understanding. I also discussed with her the other primidone listed in her medication list and that I will get clarification from Dr. Teresa Coombs. Shanda Bumps appreciative of call, will update epic medication list and fax to [phone]

## 2023-01-25 NOTE — Telephone Encounter (Signed)
Per Dr. Teresa Coombs call made to patients SNF at [phone] and instructions on Primidone given. No answer, left message for [name] to return call.  Please contact facility [name]) and advised them of medication (primidone) administration time for her essential tremors (shaking, 250 mg in the morning at 8Am before breakfast and 150 mg at 4 PM before dinner

## 2023-01-25 NOTE — Addendum Note (Signed)
Addended by: Lenn Cal on: 01/25/2023 09:18 AM   Modules accepted: Orders

## 2023-01-25 NOTE — Telephone Encounter (Signed)
250 mg at 8 AM   150 mg at 4 PM

## 2023-01-25 NOTE — Telephone Encounter (Signed)
-----   Message from [facility] sent at 01/22/2023 11:16 AM EDT ----- Please call and advise the patient/facility that her recent blood work did not show evidence of anemia or iron deficiency. No further action is required on these tests at this time. Please remind patient to keep any upcoming appointments or tests and to call us with any interim questions, concerns, problems or updates. Thanks,   Windell Norfolk, MD

## 2023-01-25 NOTE — Telephone Encounter (Signed)
Results given to [place] at [facility]. She verbalized understating and will update family.

## 2023-01-25 NOTE — Telephone Encounter (Signed)
Call back to [place] at [facility], reviewed updated and current orders for primidone and advised that a fax was sen tover with current dose and administration for primidone. She has not checked fax as of time of call, but will check and update the facility medication list as well. Appreciative of call

## 2023-01-26 ENCOUNTER — Ambulatory Visit: Payer: Medicare Other | Admitting: Podiatry

## 2023-03-29 ENCOUNTER — Encounter: Payer: Self-pay | Admitting: Neurology

## 2023-03-29 ENCOUNTER — Ambulatory Visit (INDEPENDENT_AMBULATORY_CARE_PROVIDER_SITE_OTHER): Payer: Medicare Other | Admitting: Neurology

## 2023-03-29 VITALS — BP 152/78 | HR 84 | Resp 15 | Ht 67.5 in

## 2023-03-29 DIAGNOSIS — G4700 Insomnia, unspecified: Secondary | ICD-10-CM

## 2023-03-29 DIAGNOSIS — G40909 Epilepsy, unspecified, not intractable, without status epilepticus: Secondary | ICD-10-CM

## 2023-03-29 DIAGNOSIS — G250 Essential tremor: Secondary | ICD-10-CM

## 2023-03-29 NOTE — Progress Notes (Signed)
[facility]  PATIENT: [name] DOB: 1929/07/08  REQUESTING CLINICIAN: Verlon Au, MD HISTORY FROM: Patient, niece and chart review  REASON FOR VISIT: Seizure   HISTORICAL  CHIEF COMPLAINT:  Chief Complaint  Patient presents with   Seizures    Rm12, sister present, [id number] that they can't remember when last sz was, doing well   INTERVAL HISTORY 03/29/2023 Patient presents today for follow-up, she is accompanied by her sister.  Last visit was in September.  Since then she is doing well, no seizure or seizure like activity.  She still struggles with her essential tremor as [name] still gives her the morning dose around 930, 10 AM after her breakfast.  She continues to struggle when eating breakfast.  In the evening they give her the primidone before supper, so no issue at in the evening.  Will contact [name] and have them correct the administration time   INTERVAL HISTORY 01/22/2023 Patient presents for follow-up, she is accompanied by her niece [name].  Her last visit was in [place] at that time we increased the primidone to 250 in the morning and 150 in the evening to control her essential tremor symptoms.  We also continued her phenytoin XR 300 mg nightly for the seizures.  They tell me no seizure or seizure-like activity but the tremors are getting worse.  She is having extreme difficulty eating her breakfast to the point that her oral intake has decreased.  When asking about medication, she tells me that the morning dose of her primidone is given around 10 AM after breakfast and the evening dose around 10 PM when she is ready to go to bed.  Again she is having extreme difficulty eating and now with fine movement.   INTERVAL HISTORY 09/21/2022:  [name] presents today for follow-up, she is accompanied by her niece [name].  Last visit was in October, since then she has not had any seizure or seizure-like activity.  She is compliant with the phenytoin XR  300 mg nightly.  In terms of the essential tremor, she reports that sometimes in the evening she has worsening of her symptoms.  She is on primidone 250 in the morning and 50 mg in the evening but sometimes reports in the evening she can take up to 150 mg of primidone to control her symptoms.  She continued to have gait abnormality and reports recent falls for which she had to call EMS.  Denies hitting her head.  Overall she is stable.  She is independent but limited by her gait abnormality and overall weakness.   HISTORY OF PRESENT ILLNESS:  This is a 89 year old woman with multiple medical conditions including seizure disorder, essential tremor, type 2 diabetes mellitus, hypertension, right frontal meningioma s/p resection in 2015, colon cancer who is presenting for follow-up for seizure disorder.  Patient was previously seen by Dr. Anne Hahn and Dr. Marjory Lies for her seizures.  His last visit with Dr. Danae Orleans was in February 2023, at that time plan was to continue with phenytoin 200 mg in the evening and also primidone 250 mg in the morning for her essential tremor.  In August she was admitted to the hospital for seizure work-up versus syncope.  Patient reports she was at home with a friend and then passed out on the couch, the friend could not arouse her and therefore she called EMS.  In the ED, was noted to be hyponatremic to 129, was observed in the emergency department and her condition continued  to improve after IV hydration.  On discharge, she was recommended to increase her Dilantin to 300 mg.  Since her hospitalization in August she has not had any additional seizures.  She did present to the hospital however for small bowel obstruction.  In terms of the seizure we have checked her Dilantin which was low and plan was to increase it to 350 but due to miscommunication patient is actually taking 250 mg of Dilantin nightly, again no seizures.  In terms of the tremors, she reports that tremors are still  bothersome with eating, she spilled her food has difficulty using a fork she is currently on primidone to 50 in the morning    PRIOR HPI (12/24/20; Dr. Anne Hahn): "[name] is an 89 year old right-handed white female with a history of a seizure disorder, her last seizure was on 19 Upson 2022.  The patient was in an extended care facility at that time and she is not completely sure that she was getting her medications properly.  She has a chronic gait disorder, she has fallen on occasion, she has not fallen recently.  She uses a walker to get around.  She has an essential tremor that affects her ability to feed herself at times.  She is on Mysoline taking 150 mg in the morning and 100 mg in the evening.  She has history of diabetes.  She continues to be concerned about the tremor.  She wishes to have something done about this issue.  She questions whether ultrasound ablation of the brain Skibinski be something she could consider."   PRIOR HPI (08/15/14, VRP): 89 year old female here for evaluation of tongue laceration and possible seizures. In 2015 patient was having intermittent episodes of falling down. There is question of whether she had unresponsiveness with these events. She was initially evaluated for syncopal workup with heart testing. Ultimately she was admitted to the hospital in June 2015 for confusion, altered mental status, recurrent falls. She's found to have a large right frontal meningioma and right subdural hematoma. Patient was treated with surgical resection, Decadron and antiseizure medication. Neurology consult was obtained who felt patient was also having intermittent complex partial seizures. She was discharged on Keppra 500 mg twice a day. 2 weeks after discharge patient stopped his medication on her own due to side effects of jitteriness, dizziness.   Patient continues to live on her own, continues to have intermittent falls and confusion spells. In 07/18/2014 patient woke up with large amount of  blood on her pillow and bed sheets. She had a severe tongue laceration. No muscle aches, incontinence, confusion. The next day patient went to the hospital for kyphoplasty procedure. On 07/20/2014 patient went to the emergency room for evaluation of possible infection of tongue laceration. She followed up with ENT Dr. Pollyann Kennedy. Due to history of meningioma and seizures, patient was referred back to me for consultation.   Patient still drives, short distances to church and grocery store. She is also had lower extremity weakness and numbness. She has diabetes   Previous ASMs: Levetiracetam, Lamotrigine, Carbamazepine, Topiramate   Currenty ASMs: Phenytoin 250 mg (she should be taking 350 mg), Primidone 250 mg     OTHER MEDICAL CONDITIONS: Seizure disorder, essential tremor, type 2 diabetes mellitus, hypertension, right frontal meningioma s/p resection in 2015, colon cancer  REVIEW OF SYSTEMS: Full 14 system review of systems performed and negative with exception of: As noted in the HPI   ALLERGIES: Allergies  Allergen Reactions   Bystolic [Nebivolol Hcl] Other (  See Comments)    "extreme weakness, heaviness in legs & arms, swelling in legs/arms/face, swollen abdomen, pain in bladder, feet pain, soreness in chest"   Carbamazepine Other (See Comments)    Blood poisoning   Cholestyramine Itching, Nausea And Vomiting, Rash and Other (See Comments)    "itching rash on stomach, bloated, nausea, vomiting, sleeplessness, extreme pain in arms"  Patient is taking this in 2022.   Morphine Other (See Comments)    Feels morbid, weak, still in pain   Niacin Palpitations    Fast heart beat Other reaction(s): Unknown   Norvasc [Amlodipine Besylate] Other (See Comments)    "extreme fluid retention/pain)   Optivar [Azelastine Hcl] Photosensitivity   Repaglinide Hives   Sular [Nisoldipine Er] Other (See Comments)    "severe headaches, swelling eyes, hands, feet, shortness of breath, weak, flushed face,  brain boiling, fluid retention, high blood sugar, nervous, heart fast beating"   Telmisartan Other (See Comments)    "headache, difficulty urinating, high blood sugar, fluid retention"   Amoxicillin-Pot Clavulanate Rash    Tolerates Unasyn (>10 doses)   Cefdinir Swelling    Vaginal irritation, breathing,    Ciprofloxacin Hcl Hives   Clonidine Other (See Comments)    Dry mouth, fluid retention   Codeine Nausea And Vomiting   Ezetimibe Other (See Comments)    Made weak   Hydroxychloroquine Other (See Comments)    Low platlets   Naproxen Other (See Comments)    Shrinks bladder   Sulfa Antibiotics Rash   Ziac [Bisoprolol-Hydrochlorothiazide] Other (See Comments)    "stopped urination"   Amlodipine Besy-Benazepril Hcl Cough   Azelastine Other (See Comments)    Unknown reaction - Per MAR   Elavil [Amitriptyline] Other (See Comments)    Gave Pt nightmares   Empagliflozin Other (See Comments)    "Caused yeast infection, slowed my urine"   Hydralazine Other (See Comments)    "does not reduce high blood pressure, pain in arm, high pressure, felt like I was on verge of heart attack, really weak"   Iodine I 131 Tositumomab Other (See Comments)    Unknown reaction - Per MAR   Kenalog [Triamcinolone] Diarrhea    Per MAR   Keppra [Levetiracetam] Other (See Comments)    Shaking   Lamotrigine Itching and Other (See Comments)   Pregabalin Swelling and Other (See Comments)    Weight gain   Pseudoephedrine Other (See Comments)    Unknown reaction   Risedronate     Unknown reaction - Per MAR   Ru-Hist D [Brompheniramine-Phenylephrine] Other (See Comments)    Unknown reaction - Per MAR   Sumatriptan Other (See Comments)   Topiramate Other (See Comments)    Dry eyes   Ace Inhibitors Other (See Comments)    unknown   Aspirin Other (See Comments)    Unknown reaction   Atacand [Candesartan] Other (See Comments)    Unknown reaction - MAR   Bextra [Valdecoxib] Other (See Comments)     Unknown reaction - Per MAR   Bisoprolol-Hydrochlorothiazide Other (See Comments)    Unknown reaction - Per MAR   Cefadroxil Other (See Comments)    Unknown reaction   Celecoxib Rash   Hydrocodone Other (See Comments)    unknown   Iodinated Contrast Media Rash and Other (See Comments)    "All over" rash   Meloxicam Other (See Comments)    unknown   Methylprednisolone Sodium Succinate Other (See Comments)    unknown   Nabumetone Other (See Comments)  Unknown reaction   Penicillins Other (See Comments)    unknown   Pseudoephedrine-Guaifenesin Other (See Comments)    unknown   Rofecoxib Other (See Comments)    Unknown reaction - MAR Other reaction(s): Unknown   Ru-Tuss [Chlorphen-Pse-Atrop-Hyos-Scop] Other (See Comments)    unknown   Sulfonamide Derivatives Other (See Comments)    unknown   Sulphur [Elemental Sulfur] Other (See Comments)    unknown   Telithromycin Other (See Comments)    unknown   Terfenadine Other (See Comments)    Unknown reaction - Per MAR   Trandolapril-Verapamil Hcl Er Other (See Comments)    Headache, difficulty urinating, high blood sugar, fluid retention  Pt is taking Tarka (trandolapril-verapamil) currently, but requests the medication stay in her allergy list   Valium [Diazepam] Other (See Comments)    Makes her mean and hyper    HOME MEDICATIONS: Outpatient Medications Prior to Visit  Medication Sig Dispense Refill   Acetaminophen 325 MG CAPS Take 650 mg by mouth 3 (three) times daily.     apixaban (ELIQUIS) 2.5 MG TABS tablet Take 1 tablet (2.5 mg total) by mouth 2 (two) times daily. 60 tablet 0   benzonatate (TESSALON) 100 MG capsule Take 100 mg by mouth 3 (three) times daily.     Blood Glucose Monitoring Suppl (PRODIGY VOICE BLOOD GLUCOSE) w/Device KIT Use to check blood sugar 1 time per day. 1 each 2   Cholecalciferol (VITAMIN D-3) 125 MCG (5000 UT) TABS Take 5,000 Units by mouth daily.     cholestyramine light (PREVALITE) 4 g packet Take  1 packet (4 g total) by mouth 2 (two) times daily.     cycloSPORINE (RESTASIS) 0.05 % ophthalmic emulsion Place 1 drop into both eyes 2 (two) times daily.     insulin glargine (LANTUS SOLOSTAR) 100 UNIT/ML Solostar Pen Inject 10 Units into the skin at bedtime. (Patient taking differently: Inject 13 Units into the skin at bedtime.)     insulin lispro (HUMALOG) 100 UNIT/ML injection Inject 0-12 Units into the skin 3 (three) times daily before meals.     Insulin Pen Needle 32G X 4 MM MISC Used to inject insulin 3x daily (Patient taking differently: 1 each by Other route See admin instructions. Used to inject insulin 3x daily) 270 each 2   levothyroxine (SYNTHROID) 75 MCG tablet Take 1 tablet (75 mcg total) by mouth daily before breakfast. 30 tablet 0   lidocaine (HM LIDOCAINE PATCH) 4 % Place 2 patches onto the skin daily.     Melatonin 3 MG CAPS Take 1 capsule by mouth at bedtime.     Menthol, Topical Analgesic, (BIOFREEZE COOL THE PAIN) 4 % GEL Apply 1 g topically 3 (three) times daily.     oxyCODONE (OXY IR/ROXICODONE) 5 MG immediate release tablet Take 0.5 tablets (2.5 mg total) by mouth every 6 (six) hours as needed for breakthrough pain. 20 tablet 0   pantoprazole (PROTONIX) 40 MG tablet Take 40 mg by mouth daily.     PHENYTEK 300 MG ER capsule TAKE 1 CAPSULE AT BEDTIME 90 capsule 3   Polyethyl Glycol-Propyl Glycol (SYSTANE) 0.4-0.3 % SOLN Place 2 drops into both eyes 3 (three) times daily.     polyethylene glycol (MIRALAX / GLYCOLAX) 17 g packet Take 17 g by mouth daily as needed. (Patient taking differently: Take 17 g by mouth daily as needed for mild constipation.) 14 each 0   primidone (MYSOLINE) 250 MG tablet Take 1 tablet (250 mg) at 8am  primidone (MYSOLINE) 50 MG tablet Take 3 tablets ( 150 mg) at 4pm     thiamine 250 MG tablet Take 250 mg by mouth daily.     traZODone (DESYREL) 50 MG tablet Take 50 mg by mouth at bedtime.     Verapamil HCl CR 200 MG CP24 Take 200 mg by mouth daily.      propranolol (INDERAL) 10 MG tablet Take 1 tablet (10 mg total) by mouth 3 (three) times daily for 15 days. 45 tablet 0   diclofenac Sodium (VOLTAREN) 1 % GEL Apply 2 g topically 4 (four) times daily as needed (for knee and back pain).     loperamide (IMODIUM A-D) 2 MG tablet Take 2 mg by mouth 4 (four) times daily as needed for diarrhea or loose stools.     No facility-administered medications prior to visit.    PAST MEDICAL HISTORY: Past Medical History:  Diagnosis Date   Anemia    Anxiety    Asthma    Breast cancer (HCC) 2014   right breast   Cancer of right breast (HCC) 12/26/2012   right breast 12:00 o'clock, DCIS   Carotid artery disease (HCC)    Carpal tunnel syndrome, bilateral    Chronic bronchitis (HCC)    Chronic cough    Chronic diarrhea    3 years   Chronic facial pain    Chronic foot pain    Colon cancer (HCC) dx'd 11/2019   Complication of anesthesia    Sore jaw; could not chew or move mouth, prolonged sedation   Convulsions/seizures (HCC) 10/16/2014   Diabetes mellitus    type 2 niddm x 20 years   Dyslipidemia    Ejection fraction    Gait abnormality 12/04/2019   GERD (gastroesophageal reflux disease)    Hammer toe    bilateral   History of colonic polyps    History of meningioma    HTN (hypertension)    Hx of radiation therapy 03/07/13- 03/29/13   right breast 4250 cGy 17 sessions   Hyperlipidemia    Hypokalemia    Hyponatremia    Hypothyroidism    IBS (irritable bowel syndrome)    Melanoma (HCC)    Metatarsal bone fracture 2014   Multiple drug allergies    Nausea & vomiting 03/12/2020   Nontoxic thyroid nodule    Obesity    Osteoarthritis    Osteoporosis    Palpitations    Personal history of radiation therapy 2014   Seasonal allergies    Skin cancer    Syncope    Tremor, essential 08/18/2016   Vitamin B12 deficiency    Vitamin D deficiency     PAST SURGICAL HISTORY: Past Surgical History:  Procedure Laterality Date   ABDOMINAL  HYSTERECTOMY     BIOPSY  11/07/2019   Procedure: BIOPSY;  Surgeon: Bernette Redbird, MD;  Location: [facility];  Service: Endoscopy;;   BRAIN SURGERY     BREAST BIOPSY Right 01/24/2013   Procedure: RE-EXCICION OF BREAST CANCER, ANTERIOR MARGINS;  Surgeon: Mariella Saa, MD;  Location: [facility];  Service: General;  Laterality: Right;   BREAST LUMPECTOMY Right 2014   BREAST LUMPECTOMY WITH NEEDLE LOCALIZATION Right 01/17/2013   Procedure: BREAST LUMPECTOMY WITH NEEDLE LOCALIZATION;  Surgeon: Mariella Saa, MD;  Location: [facility];  Service: General;  Laterality: Right;   BUNIONECTOMY Bilateral    CATARACT EXTRACTION W/ INTRAOCULAR LENS IMPLANT Right    CHOLECYSTECTOMY     COLONOSCOPY     COLONOSCOPY WITH  PROPOFOL N/A 11/07/2019   Procedure: COLONOSCOPY WITH PROPOFOL;  Surgeon: Bernette Redbird, MD;  Location: [facility];  Service: Endoscopy;  Laterality: N/A;   CRANIOTOMY Right 10/18/2013   Procedure: CRANIOTOMY TUMOR EXCISION;  Surgeon: Karn Cassis, MD;  Location: [facility];  Service: Neurosurgery;  Laterality: Right;   ESOPHAGOGASTRODUODENOSCOPY (EGD) WITH PROPOFOL N/A 11/07/2019   Procedure: ESOPHAGOGASTRODUODENOSCOPY (EGD) WITH PROPOFOL;  Surgeon: Bernette Redbird, MD;  Location: [facility];  Service: Endoscopy;  Laterality: N/A;   EYE SURGERY     HERNIA REPAIR     IR THORACENTESIS ASP PLEURAL SPACE W/IMG GUIDE  04/01/2020   KNEE ARTHROSCOPY Bilateral    LAPAROSCOPIC RIGHT HEMI COLECTOMY Right 03/06/2020   Procedure: LAPAROSCOPIC RIGHT HEMI COLECTOMY WITH TAP BLOCK AND LYSIS OF ADHESIONS;  Surgeon: Andria Meuse, MD;  Location: [facility];  Service: General;  Laterality: Right;   POLYPECTOMY     small adenomatous   TOTAL HIP ARTHROPLASTY Right 06/05/2020   Procedure: ARTHROPLASTY  ANTERIOR APPROACH. RIGHT HIP;  Surgeon: Samson Frederic, MD;  Location: [facility];  Service: Orthopedics;  Laterality: Right;   TOTAL HIP ARTHROPLASTY Left 12/12/2022   Procedure: TOTAL HIP ARTHROPLASTY  ANTERIOR APPROACH;  Surgeon: Samson Frederic, MD;  Location: [facility];  Service: Orthopedics;  Laterality: Left;   ULNAR TUNNEL RELEASE      FAMILY HISTORY: Family History  Problem Relation Age of Onset   Heart disease Mother    Osteoporosis Mother    Diabetes Father    Pancreatic cancer Father    Bone cancer Sister    Rectal cancer Sister    Thyroid disease Sister        benign goiter resected   Prostate cancer Brother    Colon cancer Brother    Colon cancer Other     SOCIAL HISTORY: Social History   Socioeconomic History   Marital status: Widowed    Spouse name: Not on file   Number of children: 0   Years of education: college   Highest education level: Not on file  Occupational History   Occupation: retired  Tobacco Use   Smoking status: Never   Smokeless tobacco: Never  Vaping Use   Vaping status: Never Used  Substance and Sexual Activity   Alcohol use: No   Drug use: No   Sexual activity: Not Currently    Comment: menarche age 31, hysterectomy age 89, HRT x 2-3 mos, G1- miscarriage  Other Topics Concern   Not on file  Social History Narrative   12/24/20 Widowed, lives alone. Has caregivers 8 hrs a day, no one at night- 12/29/21     Ambulates independently, uses walker at home intermittently    Drinks decaf only   Patient is right handed.   Social Determinants of Health   Financial Resource Strain: Not on file  Food Insecurity: No Food Insecurity (12/26/2022)   Hunger Vital Sign    Worried About Running Out of Food in the Last Year: Never true    Ran Out of Food in the Last Year: Never true  Transportation Needs: No Transportation Needs (12/26/2022)   PRAPARE - Administrator, Civil Service (Medical): No    Lack of Transportation (Non-Medical): No  Physical Activity: Not on file  Stress: Not on file  Social Connections: Unknown (09/16/2021)   Received from [facility], [facility]   Social Network    Social Network: Not on file  Intimate  Partner Violence: Not At Risk (12/26/2022)   Humiliation,  Afraid, Rape, and Kick questionnaire    Fear of Current or Ex-Partner: No    Emotionally Abused: No    Physically Abused: No    Sexually Abused: No    PHYSICAL EXAM  GENERAL EXAM/CONSTITUTIONAL: Vitals:  Vitals:   03/29/23 1051 03/29/23 1102  BP: (!) 155/94 (!) 152/78  Pulse: 84   Resp: 15   Height: 5' 7.5" (1.715 m)    Body mass index is 23.3 kg/m. Wt Readings from Last 3 Encounters:  01/21/23 151 lb (68.5 kg)  12/26/22 143 lb 4.8 oz (65 kg)  12/09/22 143 lb 4.8 oz (65 kg)   Patient is in no distress; well developed, nourished and groomed; neck is supple, appears pale today  MUSCULOSKELETAL: Gait, strength, tone, movements noted in Neurologic exam below  NEUROLOGIC: MENTAL STATUS:     03/28/2018   11:47 AM 08/31/2017   10:44 AM  MMSE - Mini Mental State Exam  Not completed: --   Orientation to time 5 5  Orientation to Place 4 5  Registration 3 3  Attention/ Calculation 5 4  Recall 2 2  Language- name 2 objects 2 2  Language- repeat 1 1  Language- follow 3 step command 3 3  Language- read & follow direction 1 1  Write a sentence 1 1  Copy design 1 0  Total score 28 27   awake, alert, oriented to person, place and time recent and remote memory intact normal attention and concentration language fluent, comprehension intact, naming intact fund of knowledge appropriate  CRANIAL NERVE:  2nd, 3rd, 4th, 6th - visual fields full to confrontation, extraocular muscles intact, no nystagmus 5th - facial sensation symmetric 7th - facial strength symmetric 8th - hearing intact 9th - palate elevates symmetrically, uvula midline 11th - shoulder shrug symmetric 12th - tongue protrusion midline  MOTOR:  normal bulk and tone, at least antigravity in the BUE and LLE limited by pain. (Hip fracture)  SENSORY:  normal and symmetric to light touch  COORDINATION:  There is presence of action tremors, right worse  then left  GAIT/STATION:  Deferred, using a wheelchair     DIAGNOSTIC DATA (LABS, IMAGING, TESTING) - I reviewed patient records, labs, notes, testing and imaging myself where available.  Lab Results  Component Value Date   WBC 9.6 01/21/2023   HGB 12.5 01/21/2023   HCT 37.9 01/21/2023   MCV 91 01/21/2023   PLT 426 01/21/2023      Component Value Date/Time   NA 133 (L) 01/01/2023 1112   NA 130 (L) 12/24/2020 1143   NA 138 01/04/2013 1212   K 3.2 (L) 01/01/2023 1112   K 3.9 01/04/2013 1212   CL 100 01/01/2023 1112   CO2 26 01/01/2023 1112   CO2 22 01/04/2013 1212   GLUCOSE 243 (H) 01/01/2023 1112   GLUCOSE 285 (H) 01/04/2013 1212   BUN 6 (L) 01/01/2023 1112   BUN 14 12/24/2020 1143   BUN 10.3 01/04/2013 1212   CREATININE 0.51 01/01/2023 1112   CREATININE 0.78 12/01/2013 1659   CREATININE 1.0 01/04/2013 1212   CALCIUM 7.7 (L) 01/01/2023 1112   CALCIUM 9.6 01/04/2013 1212   PROT 5.1 (L) 12/26/2022 1339   PROT 6.3 12/24/2020 1143   PROT 6.4 01/04/2013 1212   ALBUMIN 2.5 (L) 12/26/2022 1339   ALBUMIN 4.2 12/24/2020 1143   ALBUMIN 3.6 01/04/2013 1212   AST 15 12/26/2022 1339   AST 14 01/04/2013 1212   ALT 13 12/26/2022 1339   ALT 16  01/04/2013 1212   ALKPHOS 114 12/26/2022 1339   ALKPHOS 52 01/04/2013 1212   BILITOT 0.5 12/26/2022 1339   BILITOT <0.2 12/24/2020 1143   BILITOT 1.01 01/04/2013 1212   GFRNONAA >60 01/01/2023 1112   GFRAA >60 01/02/2020 0455   Lab Results  Component Value Date   CHOL 148 01/30/2013   HDL 47.10 01/30/2013   LDLCALC 65 01/30/2013   LDLDIRECT 85.7 09/05/2009   TRIG 179.0 (H) 01/30/2013   Lab Results  Component Value Date   HGBA1C 6.9 (H) 10/24/2022   Lab Results  Component Value Date   VITAMINB12 1,197 (H) 12/28/2022   Lab Results  Component Value Date   TSH 2.959 10/24/2022    Phenytoin level 7.9 (01/13/22) 7.4 (01/06/22) 11.3 (12/24/20)    ASSESSMENT AND PLAN  89 y.o. year old female  with seizure disorder,  essential tremor, type 2 diabetes mellitus, hypertension, right frontal meningioma s/p resection in 2015, colon cancer who is presenting for follow up for her seizure and essential tremors.  For her seizures, she continues to do well, no seizure and no seizure like activity. We will continue her on phenytoin ER 300 mg nightly.    In terms of her essential tremor, we will continue on the same dose of primidone but facility will have to change the time of medication administration.  Recommendation is to give her the morning dose at 8 AM before her breakfast but if unable, then they should give it at 5 AM when they administer her Levothyroxine even if this is not best medical practice. Basically she should take a morning primidone at least 30 minutes before breakfast time.  She is also complaining of trouble sleeping, she is on Melatonin 3 mg nightly, will recommend increase to 10 mg nightly.   1. Seizure disorder (HCC)   2. Essential tremor   3. Insomnia, unspecified type       Patient Instructions  Continue with phenytoin XR 300 mg nightly Please administer Primidone 250 mg at 8 AM before breakfast, if unable to do so, please give the primidone with her levothyroxine at 5 AM even if this is not best medical practice. Continue to give the evening primidone, 150 mg at 4 PM before supper Continue your other medications Consider increasing the melatonin to 10 mg nightly Return in 1 year or sooner if worse.   Per [facility] statutes, patients with seizures are not allowed to drive until they have been seizure-free for six months.  Other recommendations include using caution when using heavy equipment or power tools. Avoid working on ladders or at heights. Take showers instead of baths.  Do not swim alone.  Ensure the water temperature is not too high on the home water heater. Do not go swimming alone. Do not lock yourself in a room alone (i.e. bathroom). When caring for infants or small  children, sit down when holding, feeding, or changing them to minimize risk of injury to the child in the event you have a seizure. Maintain good sleep hygiene. Avoid alcohol.  Also recommend adequate sleep, hydration, good diet and minimize stress.   During the Seizure  - First, ensure adequate ventilation and place patients on the floor on their left side  Loosen clothing around the neck and ensure the airway is patent. If the patient is clenching the teeth, do not force the mouth open with any object as this can cause severe damage - Remove all items from the surrounding that can be hazardous.  The patient Sacca be oblivious to what's happening and Sabella not even know what he or she is doing. If the patient is confused and wandering, either gently guide him/her away and block access to outside areas - Reassure the individual and be comforting - Call 911. In most cases, the seizure ends before EMS arrives. However, there are cases when seizures [place] last over 3 to 5 minutes. Or the individual [place]ff[place] have developed breathing difficulties or severe injuries. If a pregnant patient or a person with diabetes develops a seizure, it is prudent to call an ambulance. - Finally, if the patient does not regain full consciousness, then call EMS. Most patients will remain confused for about 45 to 90 minutes after a seizure, so you must use judgment in calling for help. - Avoid restraints but make sure the patient is in a bed with padded side rails - Place the individual in a lateral position with the neck slightly flexed; this will help the saliva drain from the mouth and prevent the tongue from falling backward - Remove all nearby furniture and other hazards from the area - Provide verbal assurance as the individual is regaining consciousness - Provide the patient with privacy if possible - Call for help and start treatment as ordered by the caregiver   After the Seizure (Postictal Stage)  After a seizure, most  patients experience confusion, fatigue, muscle pain and/or a headache. Thus, one should permit the individual to sleep. For the next few days, reassurance is essential. Being calm and helping reorient the person is also of importance.  Most seizures are painless and end spontaneously. Seizures are not harmful to others but can lead to complications such as stress on the lungs, brain and the heart. Individuals with prior lung problems [place] develop labored breathing and respiratory distress.     No orders of the defined types were placed in this encounter.   No orders of the defined types were placed in this encounter.   No follow-ups on file.   Windell Norfolk, MD 03/29/2023, 12:14 PM  [facility] [address], [address] [place], [place] [address] [phone]

## 2023-03-29 NOTE — Patient Instructions (Signed)
Continue with phenytoin XR 300 mg nightly Please administer Primidone 250 mg at 8 AM before breakfast, if unable to do so, please give the primidone with her levothyroxine at 5 AM even if this is not best medical practice. Continue to give the evening primidone, 150 mg at 4 PM before supper Continue your other medications Consider increasing the melatonin to 10 mg nightly Return in 1 year or sooner if worse.

## 2023-05-13 NOTE — Telephone Encounter (Signed)
 Request for chart notes received from [facility]. Notes faxed via [facility]; confirmation success.

## 2023-09-07 ENCOUNTER — Telehealth: Payer: Self-pay | Admitting: Neurology

## 2023-09-07 NOTE — Telephone Encounter (Signed)
 Appointment details confirmed for [name] at [facility]

## 2023-11-17 ENCOUNTER — Encounter: Payer: Self-pay | Admitting: Neurology

## 2023-11-17 ENCOUNTER — Ambulatory Visit (INDEPENDENT_AMBULATORY_CARE_PROVIDER_SITE_OTHER): Admitting: Neurology

## 2023-11-17 VITALS — BP 110/70 | Ht 68.0 in | Wt 150.0 lb

## 2023-11-17 DIAGNOSIS — R5381 Other malaise: Secondary | ICD-10-CM

## 2023-11-17 DIAGNOSIS — R29898 Other symptoms and signs involving the musculoskeletal system: Secondary | ICD-10-CM | POA: Diagnosis not present

## 2023-11-17 DIAGNOSIS — G250 Essential tremor: Secondary | ICD-10-CM | POA: Diagnosis not present

## 2023-11-17 NOTE — Patient Instructions (Signed)
 Encourage aggressive physical therapy  Continue current medications  MRI Lumbar spine, I will contact you to go over the results  Return as scheduled in November

## 2023-11-17 NOTE — Progress Notes (Signed)
 [facility]  PATIENT: [name] DOB: 1930-04-06  REQUESTING CLINICIAN: Bonner Ade, MD HISTORY FROM: Patient/Sister  REASON FOR VISIT: Bilateral legs weakness    HISTORICAL  CHIEF COMPLAINT:  Chief Complaint  Patient presents with   New Patient (Initial Visit)    Rm12, with sister left leg weakness, not able to walk since hip surgery in 12/2022 referral for Bilateral leg weakness/Dr. Ade Bonner Gaba [phone]    HISTORY OF PRESENT ILLNESS:  Discussed the use of AI scribe software for clinical note transcription with the patient, who gave verbal consent to proceed.  The patient, with a history of meningioma status post resection, essential tremors and seizures, presents with left leg weakness following hip replacement surgery in August 2024. Now the weakness affect both legs and she is wheelchair bound in the past 6 months   The patient has been unable to move her left leg since undergoing hip replacement surgery following a fall that resulted in a hip fracture last August. The leg is described as 'useless' with weakness rather than pain. The surgery involved the use of wires due to the severity of the fracture per patient, and she has not been able to walk since the procedure.  Her right leg is also becoming weak, similar to the left leg. She is unable to extend or control the movement of her legs, and both knees tend to come together. No pain in the legs is reported, no back pain at rest but there is weakness.   She has followed with surgeon and was told she did not see anything surgical reason why she is not able to walk and wanted to rule out neurological causes  She has a history of a brain tumor that was removed in 2015, initially discovered during an MRI for a black eye. She reports losing eyesight in one eye and has a history of seizures that caused falls. No falls have occurred since the hip surgery.  She is currently taking medication for  tremors, including primidone  at 8 AM and 4 PM, and thyroid  medication at 5 AM. The timing of the medication affects her ability to manage tremors, particularly around meal times.  Her sister mentions that she did not consistently perform the recommended physical therapy exercises post-surgery, which Lamica have contributed to the current condition. She acknowledges that she has not been active since the surgery, which occurred almost a year ago.    OTHER MEDICAL CONDITIONS: Meningioma sp resection, Seizure, Essential tremors    REVIEW OF SYSTEMS: Full 14 system review of systems performed and negative with exception of: As noted in the HPI   ALLERGIES: Allergies  Allergen Reactions   Bystolic [Nebivolol Hcl] Other (See Comments)    extreme weakness, heaviness in legs & arms, swelling in legs/arms/face, swollen abdomen, pain in bladder, feet pain, soreness in chest   Carbamazepine Other (See Comments)    Blood poisoning   Cholestyramine  Itching, Nausea And Vomiting, Rash and Other (See Comments)    itching rash on stomach, bloated, nausea, vomiting, sleeplessness, extreme pain in arms  Patient is taking this in 2022.   Morphine  Other (See Comments)    Feels morbid, weak, still in pain   Niacin Palpitations    Fast heart beat Other reaction(s): Unknown   Norvasc [Amlodipine Besylate] Other (See Comments)    extreme fluid retention/pain)   Optivar [Azelastine Hcl] Photosensitivity   Repaglinide  Hives   Sular [Nisoldipine Er] Other (See Comments)    severe headaches, swelling  eyes, hands, feet, shortness of breath, weak, flushed face, brain boiling, fluid retention, high blood sugar, nervous, heart fast beating   Telmisartan Other (See Comments)    headache, difficulty urinating, high blood sugar, fluid retention   Amoxicillin-Pot Clavulanate Rash    Tolerates Unasyn  (>10 doses)   Cefdinir Swelling    Vaginal irritation, breathing,    Ciprofloxacin  Hcl Hives   Clonidine Other  (See Comments)    Dry mouth, fluid retention   Codeine Nausea And Vomiting   Ezetimibe Other (See Comments)    Made weak   Hydroxychloroquine Other (See Comments)    Low platlets   Naproxen Other (See Comments)    Shrinks bladder   Sulfa  Antibiotics Rash   Ziac [Bisoprolol-Hydrochlorothiazide] Other (See Comments)    stopped urination   Amlodipine Besy-Benazepril Hcl Cough   Azelastine Other (See Comments)    Unknown reaction - Per MAR   Elavil [Amitriptyline] Other (See Comments)    Gave Pt nightmares   Empagliflozin Other (See Comments)    Caused yeast infection, slowed my urine   Hydralazine  Other (See Comments)    does not reduce high blood pressure, pain in arm, high pressure, felt like I was on verge of heart attack, really weak   Iodine  I-131 Tositumomab Other (See Comments)    Unknown reaction - Per MAR   Kenalog  [Triamcinolone ] Diarrhea    Per MAR   Keppra  [Levetiracetam ] Other (See Comments)    Shaking   Lamotrigine  Itching and Other (See Comments)   Pregabalin  Swelling and Other (See Comments)    Weight gain   Pseudoephedrine Other (See Comments)    Unknown reaction   Risedronate     Unknown reaction - Per MAR   Ru-Hist D [Brompheniramine-Phenylephrine ] Other (See Comments)    Unknown reaction - Per MAR   Sumatriptan Other (See Comments)   Topiramate  Other (See Comments)    Dry eyes   Ace Inhibitors Other (See Comments)    unknown   Aspirin  Other (See Comments)    Unknown reaction   Atacand [Candesartan] Other (See Comments)    Unknown reaction - MAR   Bextra [Valdecoxib] Other (See Comments)    Unknown reaction - Per MAR   Bisoprolol-Hydrochlorothiazide Other (See Comments)    Unknown reaction - Per MAR   Cefadroxil Other (See Comments)    Unknown reaction   Celecoxib Rash   Hydrocodone  Other (See Comments)    unknown   Iodinated Contrast Media Rash and Other (See Comments)    All over rash   Meloxicam Other (See Comments)    unknown    Methylprednisolone  Sodium Succinate  Other (See Comments)    unknown   Nabumetone Other (See Comments)    Unknown reaction   Penicillins Other (See Comments)    unknown   Pseudoephedrine-Guaifenesin  Other (See Comments)    unknown   Rofecoxib Other (See Comments)    Unknown reaction - MAR Other reaction(s): Unknown   Ru-Tuss [Chlorphen-Pse-Atrop-Hyos-Scop] Other (See Comments)    unknown   Sulfonamide Derivatives Other (See Comments)    unknown   Sulphur [Elemental Sulfur] Other (See Comments)    unknown   Telithromycin Other (See Comments)    unknown   Terfenadine Other (See Comments)    Unknown reaction - Per MAR   Trandolapril -Verapamil  Hcl Er Other (See Comments)    Headache, difficulty urinating, high blood sugar, fluid retention  Pt is taking Tarka  (trandolapril -verapamil ) currently, but requests the medication stay in her allergy list   Valium [  Diazepam] Other (See Comments)    Makes her mean and hyper    HOME MEDICATIONS: Outpatient Medications Prior to Visit  Medication Sig Dispense Refill   Acetaminophen  325 MG CAPS Take 650 mg by mouth 3 (three) times daily.     apixaban  (ELIQUIS ) 2.5 MG TABS tablet Take 1 tablet (2.5 mg total) by mouth 2 (two) times daily. 60 tablet 0   benzonatate  (TESSALON ) 100 MG capsule Take 100 mg by mouth 3 (three) times daily.     Blood Glucose Monitoring Suppl (PRODIGY VOICE BLOOD GLUCOSE) w/Device KIT Use to check blood sugar 1 time per day. 1 each 2   Cholecalciferol  (VITAMIN D -3) 125 MCG (5000 UT) TABS Take 5,000 Units by mouth daily.     cholestyramine  light (PREVALITE ) 4 g packet Take 1 packet (4 g total) by mouth 2 (two) times daily.     cycloSPORINE (RESTASIS) 0.05 % ophthalmic emulsion Place 1 drop into both eyes 2 (two) times daily.     insulin  glargine (LANTUS  SOLOSTAR) 100 UNIT/ML Solostar Pen Inject 10 Units into the skin at bedtime. (Patient taking differently: Inject 13 Units into the skin at bedtime.)     insulin  lispro  (HUMALOG ) 100 UNIT/ML injection Inject 0-12 Units into the skin 3 (three) times daily before meals.     Insulin  Pen Needle 32G X 4 MM MISC Used to inject insulin  3x daily 270 each 2   levothyroxine  (SYNTHROID ) 75 MCG tablet Take 1 tablet (75 mcg total) by mouth daily before breakfast. 30 tablet 0   lidocaine  (HM LIDOCAINE  PATCH) 4 % Place 2 patches onto the skin daily.     Melatonin 3 MG CAPS Take 1 capsule by mouth at bedtime.     Menthol , Topical Analgesic, (BIOFREEZE COOL THE PAIN) 4 % GEL Apply 1 g topically 3 (three) times daily.     oxyCODONE  (OXY IR/ROXICODONE ) 5 MG immediate release tablet Take 0.5 tablets (2.5 mg total) by mouth every 6 (six) hours as needed for breakthrough pain. 20 tablet 0   pantoprazole  (PROTONIX ) 40 MG tablet Take 40 mg by mouth daily.     PHENYTEK  300 MG ER capsule TAKE 1 CAPSULE AT BEDTIME 90 capsule 3   Polyethyl Glycol-Propyl Glycol (SYSTANE) 0.4-0.3 % SOLN Place 2 drops into both eyes 3 (three) times daily.     polyethylene glycol (MIRALAX  / GLYCOLAX ) 17 g packet Take 17 g by mouth daily as needed. (Patient taking differently: Take 17 g by mouth daily as needed for mild constipation.) 14 each 0   primidone  (MYSOLINE ) 250 MG tablet Take 1 tablet (250 mg) at 8am     primidone  (MYSOLINE ) 50 MG tablet Take 3 tablets ( 150 mg) at 4pm     propranolol  (INDERAL ) 10 MG tablet Take 1 tablet (10 mg total) by mouth 3 (three) times daily for 15 days. 45 tablet 0   thiamine  250 MG tablet Take 250 mg by mouth daily.     traZODone (DESYREL) 50 MG tablet Take 50 mg by mouth at bedtime.     Verapamil  HCl CR 200 MG CP24 Take 200 mg by mouth daily.     No facility-administered medications prior to visit.    PAST MEDICAL HISTORY: Past Medical History:  Diagnosis Date   Anemia    Anxiety    Asthma    Breast cancer (HCC) 2014   right breast   Cancer of right breast (HCC) 12/26/2012   right breast 12:00 o'clock, DCIS   Carotid artery disease (HCC)  Carpal tunnel  syndrome, bilateral    Chronic bronchitis (HCC)    Chronic cough    Chronic diarrhea    3 years   Chronic facial pain    Chronic foot pain    Colon cancer (HCC) dx'd 11/2019   Complication of anesthesia    Sore jaw; could not chew or move mouth, prolonged sedation   Convulsions/seizures (HCC) 10/16/2014   Diabetes mellitus    type 2 niddm x 20 years   Dyslipidemia    Ejection fraction    Gait abnormality 12/04/2019   GERD (gastroesophageal reflux disease)    Hammer toe    bilateral   History of colonic polyps    History of meningioma    HTN (hypertension)    Hx of radiation therapy 03/07/13- 03/29/13   right breast 4250 cGy 17 sessions   Hyperlipidemia    Hypokalemia    Hyponatremia    Hypothyroidism    IBS (irritable bowel syndrome)    Melanoma (HCC)    Metatarsal bone fracture 2014   Multiple drug allergies    Nausea & vomiting 03/12/2020   Nontoxic thyroid  nodule    Obesity    Osteoarthritis    Osteoporosis    Palpitations    Personal history of radiation therapy 2014   Seasonal allergies    Skin cancer    Syncope    Tremor, essential 08/18/2016   Vitamin B12 deficiency    Vitamin D  deficiency     PAST SURGICAL HISTORY: Past Surgical History:  Procedure Laterality Date   ABDOMINAL HYSTERECTOMY     BIOPSY  11/07/2019   Procedure: BIOPSY;  Surgeon: Donnald Charleston, MD;  Location: [facility];  Service: Endoscopy;;   BRAIN SURGERY     BREAST BIOPSY Right 01/24/2013   Procedure: RE-EXCICION OF BREAST CANCER, ANTERIOR MARGINS;  Surgeon: Morene ONEIDA Olives, MD;  Location: [facility];  Service: General;  Laterality: Right;   BREAST LUMPECTOMY Right 2014   BREAST LUMPECTOMY WITH NEEDLE LOCALIZATION Right 01/17/2013   Procedure: BREAST LUMPECTOMY WITH NEEDLE LOCALIZATION;  Surgeon: Morene ONEIDA Olives, MD;  Location: [facility];  Service: General;  Laterality: Right;   BUNIONECTOMY Bilateral    CATARACT EXTRACTION W/ INTRAOCULAR LENS IMPLANT Right    CHOLECYSTECTOMY      COLONOSCOPY     COLONOSCOPY WITH PROPOFOL  N/A 11/07/2019   Procedure: COLONOSCOPY WITH PROPOFOL ;  Surgeon: Donnald Charleston, MD;  Location: [facility];  Service: Endoscopy;  Laterality: N/A;   CRANIOTOMY Right 10/18/2013   Procedure: CRANIOTOMY TUMOR EXCISION;  Surgeon: Catalina CHRISTELLA Stains, MD;  Location: [facility];  Service: Neurosurgery;  Laterality: Right;   ESOPHAGOGASTRODUODENOSCOPY (EGD) WITH PROPOFOL  N/A 11/07/2019   Procedure: ESOPHAGOGASTRODUODENOSCOPY (EGD) WITH PROPOFOL ;  Surgeon: Donnald Charleston, MD;  Location: [facility];  Service: Endoscopy;  Laterality: N/A;   EYE SURGERY     HERNIA REPAIR     IR THORACENTESIS ASP PLEURAL SPACE W/IMG GUIDE  04/01/2020   KNEE ARTHROSCOPY Bilateral    LAPAROSCOPIC RIGHT HEMI COLECTOMY Right 03/06/2020   Procedure: LAPAROSCOPIC RIGHT HEMI COLECTOMY WITH TAP BLOCK AND LYSIS OF ADHESIONS;  Surgeon: Teresa Lonni CHRISTELLA, MD;  Location: [facility];  Service: General;  Laterality: Right;   POLYPECTOMY     small adenomatous   TOTAL HIP ARTHROPLASTY Right 06/05/2020   Procedure: ARTHROPLASTY  ANTERIOR APPROACH. RIGHT HIP;  Surgeon: Fidel Rogue, MD;  Location: [facility];  Service: Orthopedics;  Laterality: Right;   TOTAL HIP ARTHROPLASTY Left 12/12/2022   Procedure: TOTAL HIP ARTHROPLASTY ANTERIOR  APPROACH;  Surgeon: Fidel Rogue, MD;  Location: [facility];  Service: Orthopedics;  Laterality: Left;   ULNAR TUNNEL RELEASE      FAMILY HISTORY: Family History  Problem Relation Age of Onset   Heart disease Mother    Osteoporosis Mother    Diabetes Father    Pancreatic cancer Father    Bone cancer Sister    Rectal cancer Sister    Thyroid  disease Sister        benign goiter resected   Prostate cancer Brother    Colon cancer Brother    Colon cancer Other     SOCIAL HISTORY: Social History   Socioeconomic History   Marital status: Widowed    Spouse name: Not on file   Number of children: 0   Years of education: college   Highest education level: Not  on file  Occupational History   Occupation: retired  Tobacco Use   Smoking status: Never   Smokeless tobacco: Never  Vaping Use   Vaping status: Never Used  Substance and Sexual Activity   Alcohol  use: No   Drug use: No   Sexual activity: Not Currently    Comment: menarche age 29, hysterectomy age 37, HRT x 2-3 mos, G1- miscarriage  Other Topics Concern   Not on file  Social History Narrative   12/24/20 Widowed, lives alone. Has caregivers 8 hrs a day, no one at night- 12/29/21     Ambulates independently, uses walker at home intermittently    Drinks decaf only   Patient is right handed.   Social Drivers of Corporate investment banker Strain: Not on file  Food Insecurity: No Food Insecurity (12/26/2022)   Hunger Vital Sign    Worried About Running Out of Food in the Last Year: Never true    Ran Out of Food in the Last Year: Never true  Transportation Needs: No Transportation Needs (12/26/2022)   PRAPARE - Administrator, Civil Service (Medical): No    Lack of Transportation (Non-Medical): No  Physical Activity: Not on file  Stress: Not on file  Social Connections: Unknown (09/16/2021)   Received from [facility]   Social Network    Social Network: Not on file  Intimate Partner Violence: Not At Risk (12/26/2022)   Humiliation, Afraid, Rape, and Kick questionnaire    Fear of Current or Ex-Partner: No    Emotionally Abused: No    Physically Abused: No    Sexually Abused: No    PHYSICAL EXAM  GENERAL EXAM/CONSTITUTIONAL: Vitals:  Vitals:   11/17/23 1412  BP: 110/70  Weight: 150 lb (68 kg)  Height: 5' 8 (1.727 m)   Body mass index is 22.81 kg/m. Wt Readings from Last 3 Encounters:  11/17/23 150 lb (68 kg)  01/21/23 151 lb (68.5 kg)  12/26/22 143 lb 4.8 oz (65 kg)   Patient is in no distress; well developed, nourished and groomed; neck is supple  MUSCULOSKELETAL: Gait, strength, tone, movements noted in Neurologic exam below  NEUROLOGIC: MENTAL  STATUS:     03/28/2018   11:47 AM 08/31/2017   10:44 AM  MMSE - Mini Mental State Exam  Not completed: --   Orientation to time 5 5  Orientation to Place 4 5  Registration 3 3  Attention/ Calculation 5 4  Recall 2 2  Language- name 2 objects 2 2  Language- repeat 1 1  Language- follow 3 step command 3 3  Language- read & follow direction 1 1  Write a sentence 1 1  Copy design 1 0  Total score 28 27   awake, alert, oriented to person, place and time recent and remote memory intact normal attention and concentration language fluent, comprehension intact, naming intact fund of knowledge appropriate  CRANIAL NERVE:  2nd, 3rd, 4th, 6th - Visual fields full to confrontation, extraocular muscles intact, no nystagmus 5th - facial sensation symmetric 7th - facial strength symmetric 8th - hearing intact 9th - palate elevates symmetrically, uvula midline 11th - shoulder shrug symmetric 12th - tongue protrusion midline  MOTOR:  normal bulk and tone, full strength in the BUE Both Lower extremities are weak, and knees are internally rotated when sitting down.  Hip flexion 2/5 bilaterally  Knee extension 3/5 bilaterally  Ankle plantar and dorsi flexion 3 to 4/5  SENSORY:  normal and symmetric to light touch  COORDINATION:  Mild tremors with FNF  REFLEXES:  Absent in the BLEs   GAIT/STATION:  Deferred, using a wheelchair     DIAGNOSTIC DATA (LABS, IMAGING, TESTING) - I reviewed patient records, labs, notes, testing and imaging myself where available.  Lab Results  Component Value Date   WBC 9.6 01/21/2023   HGB 12.5 01/21/2023   HCT 37.9 01/21/2023   MCV 91 01/21/2023   PLT 426 01/21/2023      Component Value Date/Time   NA 133 (L) 01/01/2023 1112   NA 130 (L) 12/24/2020 1143   NA 138 01/04/2013 1212   K 3.2 (L) 01/01/2023 1112   K 3.9 01/04/2013 1212   CL 100 01/01/2023 1112   CO2 26 01/01/2023 1112   CO2 22 01/04/2013 1212   GLUCOSE 243 (H) 01/01/2023  1112   GLUCOSE 285 (H) 01/04/2013 1212   BUN 6 (L) 01/01/2023 1112   BUN 14 12/24/2020 1143   BUN 10.3 01/04/2013 1212   CREATININE 0.51 01/01/2023 1112   CREATININE 0.78 12/01/2013 1659   CREATININE 1.0 01/04/2013 1212   CALCIUM  7.7 (L) 01/01/2023 1112   CALCIUM  9.6 01/04/2013 1212   PROT 5.1 (L) 12/26/2022 1339   PROT 6.3 12/24/2020 1143   PROT 6.4 01/04/2013 1212   ALBUMIN  2.5 (L) 12/26/2022 1339   ALBUMIN  4.2 12/24/2020 1143   ALBUMIN  3.6 01/04/2013 1212   AST 15 12/26/2022 1339   AST 14 01/04/2013 1212   ALT 13 12/26/2022 1339   ALT 16 01/04/2013 1212   ALKPHOS 114 12/26/2022 1339   ALKPHOS 52 01/04/2013 1212   BILITOT 0.5 12/26/2022 1339   BILITOT <0.2 12/24/2020 1143   BILITOT 1.01 01/04/2013 1212   GFRNONAA >60 01/01/2023 1112   GFRAA >60 01/02/2020 0455   Lab Results  Component Value Date   CHOL 148 01/30/2013   HDL 47.10 01/30/2013   LDLCALC 65 01/30/2013   LDLDIRECT 85.7 09/05/2009   TRIG 179.0 (H) 01/30/2013   CHOLHDL 3 01/30/2013   Lab Results  Component Value Date   HGBA1C 6.9 (H) 10/24/2022   Lab Results  Component Value Date   VITAMINB12 1,197 (H) 12/28/2022   Lab Results  Component Value Date   TSH 2.959 10/24/2022      ASSESSMENT AND PLAN  89 y.o. year old female with history of meningioma s/p resection, essential tremor, seizure disorder who is presenting with complaint of bilateral lower extremities weakness since her hip replacement surgery in August 2024.  I have explained to the patient that her weakness is likely from deconditioning.  Since surgery, she was not able to complete physical therapy and has  not been using her leg, it is almost a year.  I do not suspect any primary neurological deficit causing the bilateral lower extremities weakness, she has not had any fall, any seizures since the surgery, will defer MRI brain.  Will obtain MRI of lumbar spine to rule out structural abnormality that can cause lower extremity weakness but again  she denies any pain.  After the completion of the MRI lumbar spine, we will consider role of EMG/nerve conduction study.  Again we discussed importance of physical therapy and I have advised patient to start physical therapy daily even when sitting on the chair.  She voices understanding.  Assessment and Plan  Deconditioning of legs post-hip surgery Significant bilateral leg weakness and lack of movement following hip surgery, attributed to deconditioning due to lack of use. Differential diagnosis includes potential nerve issues, but absence of pain suggests against a pinched nerve. Symptoms have persisted for nearly a year. When sitting, legs are internally rotated due to weakness - Order MRI of the lumbar spine to rule structural abnormality that can cause the symptoms. - Will consider nerve conduction study after completion of MRI spine.  - I do not suspect any structural brain abnormalities that can cause the leg weakness, She has not had any fall after her hip surgery, will defer MRI Brain - Encourage regular leg exercises while sitting to improve muscle strength and prevent further deconditioning.  Essential Tremors Tremors managed with primidone , but timing of administration affects efficacy. Medication wears off before breakfast, increasing tremors during meals. - Instruct to take thyroid  medication at 5 AM separately from other medications. - Administer Primidone  at 8 AM and 4 PM to better manage tremors throughout the day.      1. Bilateral leg weakness   2. Physical deconditioning   3. Essential tremor     Patient Instructions  Encourage aggressive physical therapy  Continue current medications  MRI Lumbar spine, I will contact you to go over the results  Return as scheduled in November   No orders of the defined types were placed in this encounter.   No orders of the defined types were placed in this encounter.   No follow-ups on file.    Pastor Falling, MD 11/17/2023,  5:01 PM  [facility] [address], [address] [place], [place] [address] [phone]

## 2023-11-22 ENCOUNTER — Telehealth: Payer: Self-pay | Admitting: Neurology

## 2023-11-22 NOTE — Telephone Encounter (Signed)
 Thanks

## 2023-11-22 NOTE — Telephone Encounter (Signed)
 FYI-[name] called wanting to inform the provider that the pt's Phenobarbital  and Dilantin  Level's were 11.6.

## 2023-12-01 ENCOUNTER — Other Ambulatory Visit: Payer: Self-pay | Admitting: Neurology

## 2023-12-01 ENCOUNTER — Telehealth: Payer: Self-pay | Admitting: Neurology

## 2023-12-01 DIAGNOSIS — R29898 Other symptoms and signs involving the musculoskeletal system: Secondary | ICD-10-CM

## 2023-12-01 DIAGNOSIS — R269 Unspecified abnormalities of gait and mobility: Secondary | ICD-10-CM

## 2023-12-01 NOTE — Telephone Encounter (Signed)
 Yes, MRI lumbar spine. Order is in. Thanks

## 2023-12-01 NOTE — Telephone Encounter (Signed)
 I received a voice mail from this patient's friend, Lindajo Sharps, that she talked to someone here earlier today about [place] getting MRIs done and she was told there wasn't any ordered for her but someone would look into it and get back to her today and I don't see any notes from who she talked to. Does she need MRIs? Ida's phone number is [phone]

## 2023-12-01 NOTE — Telephone Encounter (Signed)
 I spoke to Lindajo, patient's sister, and let her know that [facility] will call her tomorrow to get scheduled. Their phone number is [phone]

## 2023-12-07 ENCOUNTER — Ambulatory Visit (HOSPITAL_COMMUNITY)
Admission: RE | Admit: 2023-12-07 | Discharge: 2023-12-07 | Disposition: A | Discharge status: Home / Self Care | Source: Ambulatory Visit | Attending: Neurology | Admitting: Neurology

## 2023-12-07 DIAGNOSIS — R29898 Other symptoms and signs involving the musculoskeletal system: Secondary | ICD-10-CM | POA: Diagnosis not present

## 2023-12-07 DIAGNOSIS — R269 Unspecified abnormalities of gait and mobility: Secondary | ICD-10-CM

## 2023-12-14 ENCOUNTER — Ambulatory Visit: Payer: Self-pay | Admitting: Neurology

## 2023-12-14 NOTE — Telephone Encounter (Signed)
 Pt sister called returning call and see what is MD next steps for Pt

## 2023-12-14 NOTE — Progress Notes (Signed)
 Please call patient and inform her that the lumbar spine MRI did not show any compression of the spine cord to cause the bilateral leg weakness. Her weakness is mainly from not using her legs for the past year. Continue with physical therapy and continue to follow up with Dr. Bonner.   Dr. Mohogany Toppins

## 2023-12-14 NOTE — Telephone Encounter (Signed)
 Pt sister  was returning, Inform that she will get call back

## 2023-12-14 NOTE — Telephone Encounter (Signed)
 Called and spoke to pt sister and relayed results: that the lumbar spine MRI did not show any compression of the spine cord to cause the bilateral leg weakness. Her weakness is mainly from not using her legs for the past year. Continue with physical therapy and continue to follow up with Dr. Bonner.   Pt sister stated that pt was stopped due to not progressing.I told them I would ask if Dr. Gregg personally wanted to order pt himself as they cancelled pt w/[facility] due to no progression. Pt sister voiced gratitude and understanding

## 2023-12-15 ENCOUNTER — Telehealth: Payer: Self-pay

## 2023-12-15 NOTE — Telephone Encounter (Signed)
 Result note faxed to [facility]

## 2023-12-17 ENCOUNTER — Encounter (HOSPITAL_COMMUNITY): Payer: Self-pay | Admitting: Emergency Medicine

## 2023-12-17 ENCOUNTER — Other Ambulatory Visit: Payer: Self-pay

## 2023-12-17 ENCOUNTER — Inpatient Hospital Stay (HOSPITAL_COMMUNITY)
Admission: EM | Admit: 2023-12-17 | Discharge: 2023-12-24 | DRG: 534 | Disposition: A | Discharge status: Skilled Nursing Facility | Source: Skilled Nursing Facility | Attending: Internal Medicine | Admitting: Internal Medicine

## 2023-12-17 ENCOUNTER — Emergency Department (HOSPITAL_COMMUNITY)

## 2023-12-17 DIAGNOSIS — Z993 Dependence on wheelchair: Secondary | ICD-10-CM

## 2023-12-17 DIAGNOSIS — K429 Umbilical hernia without obstruction or gangrene: Secondary | ICD-10-CM | POA: Diagnosis present

## 2023-12-17 DIAGNOSIS — E876 Hypokalemia: Secondary | ICD-10-CM | POA: Diagnosis present

## 2023-12-17 DIAGNOSIS — L89616 Pressure-induced deep tissue damage of right heel: Secondary | ICD-10-CM | POA: Diagnosis present

## 2023-12-17 DIAGNOSIS — Z9841 Cataract extraction status, right eye: Secondary | ICD-10-CM

## 2023-12-17 DIAGNOSIS — Z8262 Family history of osteoporosis: Secondary | ICD-10-CM

## 2023-12-17 DIAGNOSIS — Z85820 Personal history of malignant melanoma of skin: Secondary | ICD-10-CM

## 2023-12-17 DIAGNOSIS — Z800 Family history of malignant neoplasm of digestive organs: Secondary | ICD-10-CM

## 2023-12-17 DIAGNOSIS — Z8249 Family history of ischemic heart disease and other diseases of the circulatory system: Secondary | ICD-10-CM

## 2023-12-17 DIAGNOSIS — Z886 Allergy status to analgesic agent status: Secondary | ICD-10-CM

## 2023-12-17 DIAGNOSIS — Z7984 Long term (current) use of oral hypoglycemic drugs: Secondary | ICD-10-CM

## 2023-12-17 DIAGNOSIS — S7290XA Unspecified fracture of unspecified femur, initial encounter for closed fracture: Secondary | ICD-10-CM | POA: Diagnosis not present

## 2023-12-17 DIAGNOSIS — E1165 Type 2 diabetes mellitus with hyperglycemia: Secondary | ICD-10-CM | POA: Diagnosis present

## 2023-12-17 DIAGNOSIS — Z79899 Other long term (current) drug therapy: Secondary | ICD-10-CM

## 2023-12-17 DIAGNOSIS — Z885 Allergy status to narcotic agent status: Secondary | ICD-10-CM

## 2023-12-17 DIAGNOSIS — G40909 Epilepsy, unspecified, not intractable, without status epilepticus: Secondary | ICD-10-CM | POA: Diagnosis present

## 2023-12-17 DIAGNOSIS — Z515 Encounter for palliative care: Secondary | ICD-10-CM

## 2023-12-17 DIAGNOSIS — Z86000 Personal history of in-situ neoplasm of breast: Secondary | ICD-10-CM

## 2023-12-17 DIAGNOSIS — L89626 Pressure-induced deep tissue damage of left heel: Secondary | ICD-10-CM | POA: Diagnosis present

## 2023-12-17 DIAGNOSIS — E871 Hypo-osmolality and hyponatremia: Secondary | ICD-10-CM | POA: Diagnosis present

## 2023-12-17 DIAGNOSIS — R609 Edema, unspecified: Secondary | ICD-10-CM | POA: Diagnosis present

## 2023-12-17 DIAGNOSIS — S72401A Unspecified fracture of lower end of right femur, initial encounter for closed fracture: Secondary | ICD-10-CM | POA: Diagnosis not present

## 2023-12-17 DIAGNOSIS — Z853 Personal history of malignant neoplasm of breast: Secondary | ICD-10-CM

## 2023-12-17 DIAGNOSIS — Z961 Presence of intraocular lens: Secondary | ICD-10-CM | POA: Diagnosis present

## 2023-12-17 DIAGNOSIS — M25469 Effusion, unspecified knee: Secondary | ICD-10-CM | POA: Diagnosis present

## 2023-12-17 DIAGNOSIS — F419 Anxiety disorder, unspecified: Secondary | ICD-10-CM | POA: Diagnosis present

## 2023-12-17 DIAGNOSIS — Z91041 Radiographic dye allergy status: Secondary | ICD-10-CM

## 2023-12-17 DIAGNOSIS — Z9049 Acquired absence of other specified parts of digestive tract: Secondary | ICD-10-CM

## 2023-12-17 DIAGNOSIS — Z6826 Body mass index (BMI) 26.0-26.9, adult: Secondary | ICD-10-CM

## 2023-12-17 DIAGNOSIS — K589 Irritable bowel syndrome without diarrhea: Secondary | ICD-10-CM | POA: Diagnosis present

## 2023-12-17 DIAGNOSIS — E8722 Chronic metabolic acidosis: Secondary | ICD-10-CM | POA: Diagnosis present

## 2023-12-17 DIAGNOSIS — Z794 Long term (current) use of insulin: Secondary | ICD-10-CM

## 2023-12-17 DIAGNOSIS — G250 Essential tremor: Secondary | ICD-10-CM | POA: Diagnosis present

## 2023-12-17 DIAGNOSIS — I10 Essential (primary) hypertension: Secondary | ICD-10-CM | POA: Diagnosis present

## 2023-12-17 DIAGNOSIS — Z8349 Family history of other endocrine, nutritional and metabolic diseases: Secondary | ICD-10-CM

## 2023-12-17 DIAGNOSIS — R202 Paresthesia of skin: Secondary | ICD-10-CM | POA: Diagnosis present

## 2023-12-17 DIAGNOSIS — E039 Hypothyroidism, unspecified: Secondary | ICD-10-CM | POA: Diagnosis present

## 2023-12-17 DIAGNOSIS — Z881 Allergy status to other antibiotic agents status: Secondary | ICD-10-CM

## 2023-12-17 DIAGNOSIS — Z85828 Personal history of other malignant neoplasm of skin: Secondary | ICD-10-CM

## 2023-12-17 DIAGNOSIS — E785 Hyperlipidemia, unspecified: Secondary | ICD-10-CM | POA: Diagnosis present

## 2023-12-17 DIAGNOSIS — Z880 Allergy status to penicillin: Secondary | ICD-10-CM

## 2023-12-17 DIAGNOSIS — K219 Gastro-esophageal reflux disease without esophagitis: Secondary | ICD-10-CM | POA: Diagnosis present

## 2023-12-17 DIAGNOSIS — J4489 Other specified chronic obstructive pulmonary disease: Secondary | ICD-10-CM | POA: Diagnosis present

## 2023-12-17 DIAGNOSIS — Z7901 Long term (current) use of anticoagulants: Secondary | ICD-10-CM

## 2023-12-17 DIAGNOSIS — E440 Moderate protein-calorie malnutrition: Secondary | ICD-10-CM | POA: Diagnosis present

## 2023-12-17 DIAGNOSIS — E119 Type 2 diabetes mellitus without complications: Secondary | ICD-10-CM | POA: Diagnosis present

## 2023-12-17 DIAGNOSIS — Z96643 Presence of artificial hip joint, bilateral: Secondary | ICD-10-CM | POA: Diagnosis present

## 2023-12-17 DIAGNOSIS — Z882 Allergy status to sulfonamides status: Secondary | ICD-10-CM

## 2023-12-17 DIAGNOSIS — W19XXXA Unspecified fall, initial encounter: Secondary | ICD-10-CM | POA: Diagnosis present

## 2023-12-17 DIAGNOSIS — K5900 Constipation, unspecified: Secondary | ICD-10-CM | POA: Diagnosis present

## 2023-12-17 DIAGNOSIS — Z923 Personal history of irradiation: Secondary | ICD-10-CM

## 2023-12-17 DIAGNOSIS — M1990 Unspecified osteoarthritis, unspecified site: Secondary | ICD-10-CM | POA: Diagnosis present

## 2023-12-17 DIAGNOSIS — Z79890 Hormone replacement therapy (postmenopausal): Secondary | ICD-10-CM

## 2023-12-17 DIAGNOSIS — Y92129 Unspecified place in nursing home as the place of occurrence of the external cause: Secondary | ICD-10-CM

## 2023-12-17 DIAGNOSIS — Z85038 Personal history of other malignant neoplasm of large intestine: Secondary | ICD-10-CM

## 2023-12-17 DIAGNOSIS — M810 Age-related osteoporosis without current pathological fracture: Secondary | ICD-10-CM | POA: Diagnosis present

## 2023-12-17 DIAGNOSIS — Z888 Allergy status to other drugs, medicaments and biological substances status: Secondary | ICD-10-CM

## 2023-12-17 DIAGNOSIS — Z833 Family history of diabetes mellitus: Secondary | ICD-10-CM

## 2023-12-17 DIAGNOSIS — D320 Benign neoplasm of cerebral meninges: Secondary | ICD-10-CM | POA: Diagnosis present

## 2023-12-17 DIAGNOSIS — Z66 Do not resuscitate: Secondary | ICD-10-CM | POA: Diagnosis present

## 2023-12-17 DIAGNOSIS — Z7401 Bed confinement status: Secondary | ICD-10-CM

## 2023-12-17 LAB — CBC WITH DIFFERENTIAL/PLATELET
Abs Immature Granulocytes: 0.06 K/uL (ref 0.00–0.07)
Basophils Absolute: 0 K/uL (ref 0.0–0.1)
Basophils Relative: 0 %
Eosinophils Absolute: 0.1 K/uL (ref 0.0–0.5)
Eosinophils Relative: 1 %
HCT: 25.9 % — ABNORMAL LOW (ref 36.0–46.0)
Hemoglobin: 8.7 g/dL — ABNORMAL LOW (ref 12.0–15.0)
Immature Granulocytes: 1 %
Lymphocytes Relative: 17 %
Lymphs Abs: 1.7 K/uL (ref 0.7–4.0)
MCH: 30 pg (ref 26.0–34.0)
MCHC: 33.6 g/dL (ref 30.0–36.0)
MCV: 89.3 fL (ref 80.0–100.0)
Monocytes Absolute: 0.7 K/uL (ref 0.1–1.0)
Monocytes Relative: 7 %
Neutro Abs: 7.5 K/uL (ref 1.7–7.7)
Neutrophils Relative %: 74 %
Platelets: 259 K/uL (ref 150–400)
RBC: 2.9 MIL/uL — ABNORMAL LOW (ref 3.87–5.11)
RDW: 15.4 % (ref 11.5–15.5)
WBC: 10 K/uL (ref 4.0–10.5)
nRBC: 0 % (ref 0.0–0.2)

## 2023-12-17 LAB — COMPREHENSIVE METABOLIC PANEL WITH GFR
ALT: 12 U/L (ref 0–44)
AST: 12 U/L — ABNORMAL LOW (ref 15–41)
Albumin: 2.5 g/dL — ABNORMAL LOW (ref 3.5–5.0)
Alkaline Phosphatase: 57 U/L (ref 38–126)
Anion gap: 8 (ref 5–15)
BUN: 6 mg/dL — ABNORMAL LOW (ref 8–23)
CO2: 22 mmol/L (ref 22–32)
Calcium: 7.8 mg/dL — ABNORMAL LOW (ref 8.9–10.3)
Chloride: 94 mmol/L — ABNORMAL LOW (ref 98–111)
Creatinine, Ser: 0.46 mg/dL (ref 0.44–1.00)
GFR, Estimated: >60 mL/min (ref >60)
Glucose, Bld: 301 mg/dL — ABNORMAL HIGH (ref 70–99)
Potassium: 3.5 mmol/L (ref 3.5–5.1)
Sodium: 124 mmol/L — ABNORMAL LOW (ref 135–145)
Total Bilirubin: 0.3 mg/dL (ref 0.0–1.2)
Total Protein: 4.9 g/dL — ABNORMAL LOW (ref 6.5–8.1)

## 2023-12-17 LAB — PROTIME-INR
INR: 1.2 (ref 0.8–1.2)
Prothrombin Time: 15.7 s — ABNORMAL HIGH (ref 11.4–15.2)

## 2023-12-17 LAB — HEMOGLOBIN A1C
Hgb A1c MFr Bld: 5.9 % — ABNORMAL HIGH (ref 4.8–5.6)
Mean Plasma Glucose: 122.63 mg/dL

## 2023-12-17 LAB — APTT: aPTT: 38 s — ABNORMAL HIGH (ref 24–36)

## 2023-12-17 LAB — BASIC METABOLIC PANEL WITH GFR
Anion gap: 11 (ref 5–15)
BUN: 6 mg/dL — ABNORMAL LOW (ref 8–23)
CO2: 22 mmol/L (ref 22–32)
Calcium: 8 mg/dL — ABNORMAL LOW (ref 8.9–10.3)
Chloride: 92 mmol/L — ABNORMAL LOW (ref 98–111)
Creatinine, Ser: 0.4 mg/dL — ABNORMAL LOW (ref 0.44–1.00)
GFR, Estimated: >60 mL/min (ref >60)
Glucose, Bld: 230 mg/dL — ABNORMAL HIGH (ref 70–99)
Potassium: 3.3 mmol/L — ABNORMAL LOW (ref 3.5–5.1)
Sodium: 125 mmol/L — ABNORMAL LOW (ref 135–145)

## 2023-12-17 LAB — GLUCOSE, CAPILLARY
Glucose-Capillary: 187 mg/dL — ABNORMAL HIGH (ref 70–99)
Glucose-Capillary: 228 mg/dL — ABNORMAL HIGH (ref 70–99)

## 2023-12-17 LAB — TYPE AND SCREEN
ABO/RH(D): A POS
Antibody Screen: NEGATIVE

## 2023-12-17 MED ORDER — HYDROMORPHONE HCL 1 MG/ML IJ SOLN
0.2500 mg | INTRAMUSCULAR | Status: DC | PRN
  Administered 2023-12-18 – 2023-12-19 (×4): 0.25 mg via INTRAVENOUS
  Filled 2023-12-17 (×4): qty 0.5

## 2023-12-17 MED ORDER — PRIMIDONE 50 MG PO TABS
150.0000 mg | ORAL_TABLET | Freq: Every day | ORAL | Status: DC
  Administered 2023-12-17 – 2023-12-24 (×8): 150 mg via ORAL
  Filled 2023-12-17 (×9): qty 3

## 2023-12-17 MED ORDER — VERAPAMIL HCL ER 200 MG PO CP24: 200.0000 mg | ORAL_CAPSULE | Freq: Every day | ORAL | Status: DC

## 2023-12-17 MED ORDER — INSULIN GLARGINE-YFGN 100 UNIT/ML ~~LOC~~ SOLN
5.0000 [IU] | Freq: Every day | SUBCUTANEOUS | Status: DC
  Administered 2023-12-17 – 2023-12-20 (×4): 5 [IU] via SUBCUTANEOUS
  Filled 2023-12-17 (×5): qty 0.05

## 2023-12-17 MED ORDER — BUSPIRONE HCL 5 MG PO TABS
5.0000 mg | ORAL_TABLET | Freq: Three times a day (TID) | ORAL | Status: DC
  Administered 2023-12-17 – 2023-12-24 (×21): 5 mg via ORAL
  Filled 2023-12-17 (×21): qty 1

## 2023-12-17 MED ORDER — APIXABAN 2.5 MG PO TABS
2.5000 mg | ORAL_TABLET | Freq: Two times a day (BID) | ORAL | Status: DC
  Administered 2023-12-17 – 2023-12-24 (×14): 2.5 mg via ORAL
  Filled 2023-12-17 (×14): qty 1

## 2023-12-17 MED ORDER — ACETAMINOPHEN 500 MG PO TABS
1000.0000 mg | ORAL_TABLET | Freq: Three times a day (TID) | ORAL | Status: DC
  Administered 2023-12-17 – 2023-12-24 (×22): 1000 mg via ORAL
  Filled 2023-12-17 (×22): qty 2

## 2023-12-17 MED ORDER — PRIMIDONE 250 MG PO TABS
250.0000 mg | ORAL_TABLET | Freq: Every day | ORAL | Status: DC
  Administered 2023-12-18 – 2023-12-24 (×7): 250 mg via ORAL
  Filled 2023-12-17 (×8): qty 1

## 2023-12-17 MED ORDER — VERAPAMIL HCL ER 180 MG PO TBCR
180.0000 mg | EXTENDED_RELEASE_TABLET | Freq: Every day | ORAL | Status: DC
  Administered 2023-12-17 – 2023-12-24 (×8): 180 mg via ORAL
  Filled 2023-12-17 (×8): qty 1

## 2023-12-17 MED ORDER — PHENYTOIN SODIUM EXTENDED 100 MG PO CAPS
300.0000 mg | ORAL_CAPSULE | Freq: Every day | ORAL | Status: DC
  Administered 2023-12-17 – 2023-12-23 (×7): 300 mg via ORAL
  Filled 2023-12-17 (×8): qty 3

## 2023-12-17 MED ORDER — CHOLESTYRAMINE LIGHT 4 G PO PACK
4.0000 g | PACK | Freq: Two times a day (BID) | ORAL | Status: DC
  Administered 2023-12-17 – 2023-12-21 (×8): 4 g via ORAL
  Filled 2023-12-17 (×10): qty 1

## 2023-12-17 MED ORDER — FENTANYL CITRATE PF 50 MCG/ML IJ SOSY
50.0000 ug | PREFILLED_SYRINGE | Freq: Once | INTRAMUSCULAR | Status: AC
  Administered 2023-12-17: 50 ug via INTRAVENOUS
  Filled 2023-12-17: qty 1

## 2023-12-17 MED ORDER — TRAZODONE HCL 50 MG PO TABS
25.0000 mg | ORAL_TABLET | Freq: Every evening | ORAL | Status: DC | PRN
  Administered 2023-12-18 – 2023-12-23 (×3): 25 mg via ORAL
  Filled 2023-12-17 (×4): qty 1

## 2023-12-17 MED ORDER — SODIUM CHLORIDE 0.9 % IV BOLUS
1000.0000 mL | Freq: Once | INTRAVENOUS | Status: AC
  Administered 2023-12-17: 1000 mL via INTRAVENOUS

## 2023-12-17 MED ORDER — POLYETHYLENE GLYCOL 3350 17 G PO PACK
17.0000 g | PACK | Freq: Every day | ORAL | Status: DC
  Administered 2023-12-17 – 2023-12-19 (×3): 17 g via ORAL
  Filled 2023-12-17 (×3): qty 1

## 2023-12-17 MED ORDER — LEVOTHYROXINE SODIUM 75 MCG PO TABS
75.0000 ug | ORAL_TABLET | Freq: Every day | ORAL | Status: DC
  Administered 2023-12-18 – 2023-12-24 (×7): 75 ug via ORAL
  Filled 2023-12-17 (×7): qty 1

## 2023-12-17 MED ORDER — OXYCODONE HCL 5 MG PO TABS
2.5000 mg | ORAL_TABLET | ORAL | Status: DC | PRN
  Administered 2023-12-17 – 2023-12-22 (×8): 2.5 mg via ORAL
  Filled 2023-12-17 (×8): qty 1

## 2023-12-17 MED ORDER — INSULIN ASPART 100 UNIT/ML IJ SOLN
0.0000 [IU] | Freq: Three times a day (TID) | INTRAMUSCULAR | Status: DC
  Administered 2023-12-18 – 2023-12-19 (×5): 2 [IU] via SUBCUTANEOUS
  Administered 2023-12-19: 1 [IU] via SUBCUTANEOUS
  Administered 2023-12-20 (×2): 2 [IU] via SUBCUTANEOUS

## 2023-12-17 MED ORDER — MELATONIN 3 MG PO TABS
3.0000 mg | ORAL_TABLET | Freq: Every day | ORAL | Status: DC
  Administered 2023-12-17 – 2023-12-23 (×7): 3 mg via ORAL
  Filled 2023-12-17 (×7): qty 1

## 2023-12-17 MED ORDER — PANTOPRAZOLE SODIUM 20 MG PO TBEC
20.0000 mg | DELAYED_RELEASE_TABLET | ORAL | Status: DC
  Administered 2023-12-17 – 2023-12-21 (×3): 20 mg via ORAL
  Filled 2023-12-17 (×3): qty 1

## 2023-12-17 MED ORDER — CYCLOSPORINE 0.05 % OP EMUL
1.0000 [drp] | Freq: Two times a day (BID) | OPHTHALMIC | Status: DC
  Administered 2023-12-17 – 2023-12-24 (×14): 1 [drp] via OPHTHALMIC
  Filled 2023-12-17 (×15): qty 30

## 2023-12-17 NOTE — Consult Note (Signed)
 Reason for Consult:Right femur fx Referring Physician: Lavanda Bolster Time called: 1217 Time at bedside: 1244   [name] is an 89 y.o. female.  HPI: [name] was being transferred at the [facility] where she resides on Wednesday and she fell. She normally is transferred in a [place] but it was not used in this case. She had immediate right knee pain. When it was still hurting today she was brought to the ED where x-rays showed a distal femur fx and orthopedic surgery was consulted. She is non-ambulatory and does not use her legs for anything.  Past Medical History:  Diagnosis Date   Anemia    Anxiety    Asthma    Breast cancer (HCC) 2014   right breast   Cancer of right breast (HCC) 12/26/2012   right breast 12:00 o'clock, DCIS   Carotid artery disease (HCC)    Carpal tunnel syndrome, bilateral    Chronic bronchitis (HCC)    Chronic cough    Chronic diarrhea    3 years   Chronic facial pain    Chronic foot pain    Colon cancer (HCC) dx'd 11/2019   Complication of anesthesia    Sore jaw; could not chew or move mouth, prolonged sedation   Convulsions/seizures (HCC) 10/16/2014   Diabetes mellitus    type 2 niddm x 20 years   Dyslipidemia    Ejection fraction    Gait abnormality 12/04/2019   GERD (gastroesophageal reflux disease)    Hammer toe    bilateral   History of colonic polyps    History of meningioma    HTN (hypertension)    Hx of radiation therapy 03/07/13- 03/29/13   right breast 4250 cGy 17 sessions   Hyperlipidemia    Hypokalemia    Hyponatremia    Hypothyroidism    IBS (irritable bowel syndrome)    Melanoma (HCC)    Metatarsal bone fracture 2014   Multiple drug allergies    Nausea & vomiting 03/12/2020   Nontoxic thyroid  nodule    Obesity    Osteoarthritis    Osteoporosis    Palpitations    Personal history of radiation therapy 2014   Seasonal allergies    Skin cancer    Syncope    Tremor, essential 08/18/2016   Vitamin B12 deficiency    Vitamin D  deficiency      Past Surgical History:  Procedure Laterality Date   ABDOMINAL HYSTERECTOMY     BIOPSY  11/07/2019   Procedure: BIOPSY;  Surgeon: Donnald Charleston, MD;  Location: [facility];  Service: Endoscopy;;   BRAIN SURGERY     BREAST BIOPSY Right 01/24/2013   Procedure: RE-EXCICION OF BREAST CANCER, ANTERIOR MARGINS;  Surgeon: Morene ONEIDA Olives, MD;  Location: [facility];  Service: General;  Laterality: Right;   BREAST LUMPECTOMY Right 2014   BREAST LUMPECTOMY WITH NEEDLE LOCALIZATION Right 01/17/2013   Procedure: BREAST LUMPECTOMY WITH NEEDLE LOCALIZATION;  Surgeon: Morene ONEIDA Olives, MD;  Location: [facility];  Service: General;  Laterality: Right;   BUNIONECTOMY Bilateral    CATARACT EXTRACTION W/ INTRAOCULAR LENS IMPLANT Right    CHOLECYSTECTOMY     COLONOSCOPY     COLONOSCOPY WITH PROPOFOL  N/A 11/07/2019   Procedure: COLONOSCOPY WITH PROPOFOL ;  Surgeon: Donnald Charleston, MD;  Location: [facility];  Service: Endoscopy;  Laterality: N/A;   CRANIOTOMY Right 10/18/2013   Procedure: CRANIOTOMY TUMOR EXCISION;  Surgeon: Catalina CHRISTELLA Stains, MD;  Location: [facility];  Service: Neurosurgery;  Laterality: Right;   ESOPHAGOGASTRODUODENOSCOPY (  EGD) WITH PROPOFOL  N/A 11/07/2019   Procedure: ESOPHAGOGASTRODUODENOSCOPY (EGD) WITH PROPOFOL ;  Surgeon: Donnald Charleston, MD;  Location: [facility];  Service: Endoscopy;  Laterality: N/A;   EYE SURGERY     HERNIA REPAIR     IR THORACENTESIS ASP PLEURAL SPACE W/IMG GUIDE  04/01/2020   KNEE ARTHROSCOPY Bilateral    LAPAROSCOPIC RIGHT HEMI COLECTOMY Right 03/06/2020   Procedure: LAPAROSCOPIC RIGHT HEMI COLECTOMY WITH TAP BLOCK AND LYSIS OF ADHESIONS;  Surgeon: Teresa Lonni HERO, MD;  Location: [facility];  Service: General;  Laterality: Right;   POLYPECTOMY     small adenomatous   TOTAL HIP ARTHROPLASTY Right 06/05/2020   Procedure: ARTHROPLASTY  ANTERIOR APPROACH. RIGHT HIP;  Surgeon: Fidel Rogue, MD;  Location: [facility];  Service: Orthopedics;  Laterality: Right;    TOTAL HIP ARTHROPLASTY Left 12/12/2022   Procedure: TOTAL HIP ARTHROPLASTY ANTERIOR APPROACH;  Surgeon: Fidel Rogue, MD;  Location: [facility];  Service: Orthopedics;  Laterality: Left;   ULNAR TUNNEL RELEASE      Family History  Problem Relation Age of Onset   Heart disease Mother    Osteoporosis Mother    Diabetes Father    Pancreatic cancer Father    Bone cancer Sister    Rectal cancer Sister    Thyroid  disease Sister        benign goiter resected   Prostate cancer Brother    Colon cancer Brother    Colon cancer Other     Social History:  reports that she has never smoked. She has never used smokeless tobacco. She reports that she does not drink alcohol  and does not use drugs.  Allergies:  Allergies  Allergen Reactions   Bystolic [Nebivolol Hcl] Other (See Comments)    extreme weakness, heaviness in legs & arms, swelling in legs/arms/face, swollen abdomen, pain in bladder, feet pain, soreness in chest   Carbamazepine Other (See Comments)    Blood poisoning   Cholestyramine  Itching, Nausea And Vomiting, Rash and Other (See Comments)    itching rash on stomach, bloated, nausea, vomiting, sleeplessness, extreme pain in arms  Patient is taking this in 2022.   Morphine  Other (See Comments)    Feels morbid, weak, still in pain   Niacin Palpitations    Fast heart beat Other reaction(s): Unknown   Norvasc [Amlodipine Besylate] Other (See Comments)    extreme fluid retention/pain)   Optivar [Azelastine Hcl] Photosensitivity   Repaglinide  Hives   Sular [Nisoldipine Er] Other (See Comments)    severe headaches, swelling eyes, hands, feet, shortness of breath, weak, flushed face, brain boiling, fluid retention, high blood sugar, nervous, heart fast beating   Telmisartan Other (See Comments)    headache, difficulty urinating, high blood sugar, fluid retention   Amoxicillin-Pot Clavulanate Rash    Tolerates Unasyn  (>10 doses)   Cefdinir Swelling    Vaginal irritation,  breathing,    Ciprofloxacin  Hcl Hives   Clonidine Other (See Comments)    Dry mouth, fluid retention   Codeine Nausea And Vomiting   Ezetimibe Other (See Comments)    Made weak   Hydroxychloroquine Other (See Comments)    Low platlets   Naproxen Other (See Comments)    Shrinks bladder   Sulfa  Antibiotics Rash   Ziac [Bisoprolol-Hydrochlorothiazide] Other (See Comments)    stopped urination   Amlodipine Besy-Benazepril Hcl Cough   Azelastine Other (See Comments)    Unknown reaction - Per MAR   Elavil [Amitriptyline] Other (See Comments)    Gave Pt nightmares  Empagliflozin Other (See Comments)    Caused yeast infection, slowed my urine   Hydralazine  Other (See Comments)    does not reduce high blood pressure, pain in arm, high pressure, felt like I was on verge of heart attack, really weak   Iodine  I-131 Tositumomab Other (See Comments)    Unknown reaction - Per MAR   Kenalog  [Triamcinolone ] Diarrhea    Per MAR   Keppra  [Levetiracetam ] Other (See Comments)    Shaking   Lamotrigine  Itching and Other (See Comments)   Pravastatin Other (See Comments)   Pregabalin  Swelling and Other (See Comments)    Weight gain   Pseudoephedrine Other (See Comments)    Unknown reaction   Risedronate     Unknown reaction - Per MAR   Ru-Hist D [Brompheniramine-Phenylephrine ] Other (See Comments)    Unknown reaction - Per MAR   Sulfur Other (See Comments)    sulfur   Sumatriptan Other (See Comments)   Topiramate  Other (See Comments)    Dry eyes   Ace Inhibitors Other (See Comments)    unknown   Aspirin  Other (See Comments)    Unknown reaction   Atacand [Candesartan] Other (See Comments)    Unknown reaction - MAR   Bextra [Valdecoxib] Other (See Comments)    Unknown reaction - Per MAR   Bisoprolol-Hydrochlorothiazide Other (See Comments)    Unknown reaction - Per MAR   Cefadroxil Other (See Comments)    Unknown reaction   Celecoxib Rash   Hydrocodone  Other (See Comments)     unknown   Iodinated Contrast Media Rash and Other (See Comments)    All over rash   Meloxicam Other (See Comments)    unknown   Methylprednisolone  Sodium Succinate  Other (See Comments)    unknown   Nabumetone Other (See Comments)    Unknown reaction   Penicillins Other (See Comments)    unknown   Pseudoephedrine-Guaifenesin  Other (See Comments)    unknown   Rofecoxib Other (See Comments)    Unknown reaction - MAR Other reaction(s): Unknown   Ru-Tuss [Chlorphen-Pse-Atrop-Hyos-Scop] Other (See Comments)    unknown   Sulfonamide Derivatives Other (See Comments)    unknown   Sulphur [Elemental Sulfur] Other (See Comments)    unknown   Telithromycin Other (See Comments)    unknown   Terfenadine Other (See Comments)    Unknown reaction - Per MAR   Trandolapril -Verapamil  Hcl Er Other (See Comments)    Headache, difficulty urinating, high blood sugar, fluid retention  Pt is taking Tarka  (trandolapril -verapamil ) currently, but requests the medication stay in her allergy list   Valium [Diazepam] Other (See Comments)    Makes her mean and hyper    Medications: I have reviewed the patient's current medications.  Results for orders placed or performed during the hospital encounter of 12/17/23 (from the past 48 hours)  CBC with Differential     Status: Abnormal   Collection Time: 12/17/23 10:29 AM  Result Value Ref Range   WBC 10.0 4.0 - 10.5 K/uL   RBC 2.90 (L) 3.87 - 5.11 MIL/uL   Hemoglobin 8.7 (L) 12.0 - 15.0 g/dL   HCT 74.0 (L) 63.9 - 53.9 %   MCV 89.3 80.0 - 100.0 fL   MCH 30.0 26.0 - 34.0 pg   MCHC 33.6 30.0 - 36.0 g/dL   RDW 84.5 88.4 - 84.4 %   Platelets 259 150 - 400 K/uL   nRBC 0.0 0.0 - 0.2 %   Neutrophils Relative % 74 %  Neutro Abs 7.5 1.7 - 7.7 K/uL   Lymphocytes Relative 17 %   Lymphs Abs 1.7 0.7 - 4.0 K/uL   Monocytes Relative 7 %   Monocytes Absolute 0.7 0.1 - 1.0 K/uL   Eosinophils Relative 1 %   Eosinophils Absolute 0.1 0.0 - 0.5 K/uL   Basophils  Relative 0 %   Basophils Absolute 0.0 0.0 - 0.1 K/uL   Immature Granulocytes 1 %   Abs Immature Granulocytes 0.06 0.00 - 0.07 K/uL    Comment: Performed at [facility], [address], [place], [place] [address]  Comprehensive metabolic panel     Status: Abnormal   Collection Time: 12/17/23 10:29 AM  Result Value Ref Range   Sodium 124 (L) 135 - 145 mmol/L   Potassium 3.5 3.5 - 5.1 mmol/L   Chloride 94 (L) 98 - 111 mmol/L   CO2 22 22 - 32 mmol/L   Glucose, Bld 301 (H) 70 - 99 mg/dL    Comment: Glucose reference range applies only to samples taken after fasting for at least 8 hours.   BUN 6 (L) 8 - 23 mg/dL   Creatinine, Ser 9.53 0.44 - 1.00 mg/dL   Calcium  7.8 (L) 8.9 - 10.3 mg/dL   Total Protein 4.9 (L) 6.5 - 8.1 g/dL   Albumin  2.5 (L) 3.5 - 5.0 g/dL   AST 12 (L) 15 - 41 U/L   ALT 12 0 - 44 U/L   Alkaline Phosphatase 57 38 - 126 U/L   Total Bilirubin 0.3 0.0 - 1.2 mg/dL   GFR, Estimated >39 >39 mL/min    Comment: (NOTE) Calculated using the CKD-EPI Creatinine Equation (2021)    Anion gap 8 5 - 15    Comment: Performed at [facility], [address], [place], [place] [address]  Protime-INR     Status: Abnormal   Collection Time: 12/17/23 10:29 AM  Result Value Ref Range   Prothrombin Time 15.7 (H) 11.4 - 15.2 seconds   INR 1.2 0.8 - 1.2    Comment: (NOTE) INR goal varies based on device and disease states. Performed at [facility], [address] [place], [place] [address]   APTT     Status: Abnormal   Collection Time: 12/17/23 10:29 AM  Result Value Ref Range   aPTT 38 (H) 24 - 36 seconds    Comment:        IF BASELINE aPTT IS ELEVATED, SUGGEST PATIENT RISK ASSESSMENT BE USED TO DETERMINE APPROPRIATE ANTICOAGULANT THERAPY. Performed at [facility], [address], [place], [place] [address]   Type and screen [facility]     Status: None   Collection Time: 12/17/23 10:30 AM  Result Value Ref Range   ABO/RH(D) A POS     Antibody Screen NEG    Sample Expiration      12/20/2023,2359 Performed at [facility], [address] [place], [place] [address]    *Note: Due to a large number of results and/or encounters for the requested time period, some results have not been displayed. A complete set of results can be found in Results Review.    DG Femur Min 2 Views Right Result Date: 12/17/2023 CLINICAL DATA:  Fall. EXAM: RIGHT FEMUR 2 VIEWS COMPARISON:  None Available. FINDINGS: Possible minimally displaced distal right femoral fracture is noted. Moderate size suprapatellar joint effusion is noted. Status post right total hip arthroplasty. Vascular calcifications are noted. IMPRESSION: Possible minimally displaced  distal right femoral fracture. Moderate size joint effusion is noted. CT scan is recommended for further evaluation. Electronically Signed   By: Lynwood Landy Raddle M.D.   On: 12/17/2023 11:27   DG Tibia/Fibula Right Result Date: 12/17/2023 CLINICAL DATA:  Fall. EXAM: RIGHT TIBIA AND FIBULA - 2 VIEW COMPARISON:  None Available. FINDINGS: There is no evidence of fracture or other focal bone lesions. Soft tissues are unremarkable. IMPRESSION: Negative. Electronically Signed   By: Lynwood Landy Raddle M.D.   On: 12/17/2023 11:25   DG Pelvis 1-2 Views Result Date: 12/17/2023 CLINICAL DATA:  Fall. EXAM: PELVIS - 1-2 VIEW COMPARISON:  December 12, 2022. FINDINGS: Status post bilateral total hip arthroplasties. There is no evidence of pelvic fracture or diastasis. No pelvic bone lesions are seen. IMPRESSION: No acute abnormality seen. Electronically Signed   By: Lynwood Landy Raddle M.D.   On: 12/17/2023 11:24    Review of Systems  HENT:  Negative for ear discharge, ear pain, hearing loss and tinnitus.   Eyes:  Negative for photophobia and pain.  Respiratory:  Negative for cough and shortness of breath.   Cardiovascular:  Negative for chest pain.  Gastrointestinal:  Negative for abdominal pain, nausea and vomiting.   Genitourinary:  Negative for dysuria, flank pain, frequency and urgency.  Musculoskeletal:  Positive for arthralgias (Right knee pain). Negative for back pain, myalgias and neck pain.  Neurological:  Negative for dizziness and headaches.  Hematological:  Does not bruise/bleed easily.  Psychiatric/Behavioral:  The patient is not nervous/anxious.    Blood pressure (!) 166/72, pulse 82, temperature 98 F (36.7 C), resp. rate (!) 22, height 5' 8 (1.727 m), weight 68 kg, SpO2 98%. Physical Exam Constitutional:      General: She is not in acute distress.    Appearance: She is well-developed. She is not diaphoretic.  HENT:     Head: Normocephalic and atraumatic.  Eyes:     General: No scleral icterus.       Right eye: No discharge.        Left eye: No discharge.     Conjunctiva/sclera: Conjunctivae normal.  Cardiovascular:     Rate and Rhythm: Normal rate and regular rhythm.  Pulmonary:     Effort: Pulmonary effort is normal. No respiratory distress.  Musculoskeletal:     Cervical back: Normal range of motion.     Comments: RLE No traumatic wounds, ecchymosis, or rash  Mod TTP knee  Mod knee effusion  Sens DPN, SPN, TN intact  Motor EHL, ext, flex, evers 0/5  DP 2+, PT 2+, 3+ pitting edema  Skin:    General: Skin is warm and dry.  Neurological:     Mental Status: She is alert.  Psychiatric:        Mood and Affect: Mood normal.        Behavior: Behavior normal.     Assessment/Plan: Right distal femur fx -- Will get CT to better characterize. Likely non-operative treatment in a [facility]. She can f/u with Dr. Kendal in 2 weeks.    Ozell DOROTHA Ned, PA-C Orthopedic Surgery [phone] 12/17/2023, 12:50 PM

## 2023-12-17 NOTE — ED Provider Notes (Signed)
 [facility] Provider Note   CSN: [id number] Arrival date & time: 12/17/23  1012     Patient presents with: Fall and Leg Pain   [name] is a 89 y.o. female past medical history significant for meningioma s/p resection, essential tremors and seizure disorder, hypothyroidism, history of anemia, hypertension, hyperlipidemia, type 2 diabetes who presents emergency department for right lower extremity pain after fall.  Patient was brought in by EMS from facility.  Facility stated that the patient had a mechanical fall approximately 2 days ago.  Patient states that she was trying to get into bed when she was clumsy causing her to fall on her right lower extremity.  Patient denies head strike or LOC self-consciousness.  Patient had continued pain in the right lower extremity therefore x-ray imaging was obtained with concern for fracture.  On arrival patient is endorsing right leg pain and denies any other injuries at this time    Fall  Leg Pain      Prior to Admission medications   Medication Sig Start Date End Date Taking? Authorizing Provider  Acetaminophen  325 MG CAPS Take 650 mg by mouth 3 (three) times daily.   Yes [provider]  apixaban  (ELIQUIS ) 2.5 MG TABS tablet Take 1 tablet (2.5 mg total) by mouth 2 (two) times daily. 12/14/22  Yes Hill, Valery RAMAN, PA-C  busPIRone  (BUSPAR ) 5 MG tablet Take 5 mg by mouth 3 (three) times daily.   Yes [provider]  Cholecalciferol  (VITAMIN D -3) 125 MCG (5000 UT) TABS Take 5,000 Units by mouth daily.   Yes [provider]  cholestyramine  light (PREVALITE ) 4 g packet Take 1 packet (4 g total) by mouth 2 (two) times daily. 11/01/22  Yes Dahal, Chapman, MD  cycloSPORINE  (RESTASIS ) 0.05 % ophthalmic emulsion Place 1 drop into both eyes 2 (two) times daily.   Yes [provider]  Glycerin-Hypromellose-PEG 400 (ARTIFICIAL TEARS) 0.2-0.2-1 % SOLN Apply 2 drops to eye in the  morning, at noon, and at bedtime.   Yes [provider]  insulin  glargine (LANTUS  SOLOSTAR) 100 UNIT/ML Solostar Pen Inject 10 Units into the skin at bedtime. Patient taking differently: Inject 17 Units into the skin daily. 08/21/20  Yes Vann, Jessica U, DO  levothyroxine  (SYNTHROID ) 75 MCG tablet Take 1 tablet (75 mcg total) by mouth daily before breakfast. 07/08/20  Yes Fargo, Amy E, NP  lidocaine  (HM LIDOCAINE  PATCH) 4 % Place 2 patches onto the skin in the morning and at bedtime. Apply to left knee and lower back   Yes [provider]  Melatonin 10 MG TABS Take 10 mg by mouth at bedtime.   Yes [provider]  Menthol , Topical Analgesic, (BIOFREEZE COOL THE PAIN) 4 % GEL Apply 1 g topically 3 (three) times daily. Apply to both knees   Yes [provider]  oxyCODONE  (OXY IR/ROXICODONE ) 5 MG immediate release tablet Take 0.5 tablets (2.5 mg total) by mouth every 6 (six) hours as needed for breakthrough pain. 01/03/23  Yes Patsy Lenis, MD  pantoprazole  (PROTONIX ) 20 MG tablet Take 20 mg by mouth every other day.   Yes [provider]  PHENYTEK  300 MG ER capsule TAKE 1 CAPSULE AT BEDTIME 04/30/22  Yes Camara, Amadou, MD  polyethylene glycol (MIRALAX  / GLYCOLAX ) 17 g packet Take 17 g by mouth daily as needed. Patient taking differently: Take 17 g by mouth daily as needed for mild constipation. 12/15/22  Yes Odell Celinda Balo,  MD  primidone  (MYSOLINE ) 250 MG tablet Take 1 tablet (250 mg) at 8am 01/25/23  Yes Camara, Pastor, MD  primidone  (MYSOLINE ) 50 MG tablet Take 3 tablets ( 150 mg) at 4pm 01/25/23  Yes Camara, Amadou, MD  psyllium (REGULOID) 0.52 g capsule Take 2 capsules by mouth in the morning, at noon, and at bedtime.   Yes [provider]  sodium chloride  1 g tablet Take 1 g by mouth in the morning and at bedtime.   Yes [provider]  traMADol  (ULTRAM ) 50 MG tablet Take 50 mg by mouth 2 (two) times daily as needed for moderate pain  (pain score 4-6) or severe pain (pain score 7-10).   Yes [provider]  traZODone  (DESYREL ) 50 MG tablet Take 25 mg by mouth at bedtime.   Yes [provider]  Verapamil  HCl CR 200 MG CP24 Take 200 mg by mouth daily. 10/19/20  Yes [provider]  Blood Glucose Monitoring Suppl (PRODIGY VOICE BLOOD GLUCOSE) w/Device KIT Use to check blood sugar 1 time per day. 10/18/15   Kassie Mallick, MD  hydrocortisone  cream 1 % Apply 1 Application topically 2 (two) times daily. Patient not taking: Reported on 12/17/2023    [provider]  Insulin  Pen Needle 32G X 4 MM MISC Used to inject insulin  3x daily 11/19/16   Kassie Mallick, MD    Allergies: Bystolic [nebivolol hcl], Carbamazepine, Cholestyramine , Morphine , Niacin, Norvasc [amlodipine besylate], Optivar [azelastine hcl], Repaglinide , Sular [nisoldipine er], Telmisartan, Amoxicillin-pot clavulanate, Cefdinir, Ciprofloxacin  hcl, Clonidine, Codeine, Ezetimibe, Hydroxychloroquine, Naproxen, Sulfa  antibiotics, Ziac [bisoprolol-hydrochlorothiazide], Amlodipine besy-benazepril hcl, Azelastine, Elavil [amitriptyline], Empagliflozin, Hydralazine , Iodine  i-131 tositumomab, Kenalog  [triamcinolone ], Keppra  [levetiracetam ], Lamotrigine , Pravastatin, Pregabalin , Pseudoephedrine, Risedronate, Ru-hist d [brompheniramine-phenylephrine ], Sulfur, Sumatriptan, Topiramate , Ace inhibitors, Aspirin , Atacand [candesartan], Bextra [valdecoxib], Bisoprolol-hydrochlorothiazide, Cefadroxil, Celecoxib, Hydrocodone , Iodinated contrast media, Meloxicam, Methylprednisolone  sodium succinate , Nabumetone, Penicillins, Pseudoephedrine-guaifenesin , Rofecoxib, Ru-tuss [chlorphen-pse-atrop-hyos-scop], Sulfonamide derivatives, Sulphur [elemental sulfur], Telithromycin, Terfenadine, Trandolapril -verapamil  hcl er, and Valium [diazepam]    Review of Systems  Updated Vital Signs BP (!) 140/78   Pulse 90   Temp 98 F (36.7 C)   Resp (!) 22   Ht 5' 8 (1.727 m)   Wt  68 kg   SpO2 98%   BMI 22.81 kg/m   Physical Exam Vitals reviewed.  HENT:     Head: Normocephalic and atraumatic.     Right Ear: External ear normal.     Left Ear: External ear normal.  Eyes:     Pupils: Pupils are equal, round, and reactive to light.  Cardiovascular:     Rate and Rhythm: Normal rate.     Pulses:          Dorsalis pedis pulses are 2+ on the right side and 2+ on the left side.     Heart sounds: Normal heart sounds. No murmur heard. Pulmonary:     Effort: Pulmonary effort is normal. No tachypnea.  Abdominal:     General: There is no distension.     Palpations: Abdomen is soft.  Musculoskeletal:     Cervical back: Full passive range of motion without pain and normal range of motion. No spinous process tenderness or muscular tenderness.     Comments: Decreased range of motion of right lower extremity secondary to pain.  Significant tenderness to palpation of anterior, lateral and medial knee and distal femur.  No obvious deformity however there is edema surrounding the knee without erythema, abrasion or laceration  No midline thoracic or lumbar tenderness to palpation.  No  obvious step-offs or deformities  Bilateral upper extremities with full passive range of motion.  No obvious deformity, tenderness to palpation, laceration or abrasion  Neurological:     Mental Status: She is alert and oriented to person, place, and time.  Psychiatric:        Behavior: Behavior is cooperative.     (all labs ordered are listed, but only abnormal results are displayed) Labs Reviewed  CBC WITH DIFFERENTIAL/PLATELET - Abnormal; Notable for the following components:      Result Value   RBC 2.90 (*)    Hemoglobin 8.7 (*)    HCT 25.9 (*)    All other components within normal limits  COMPREHENSIVE METABOLIC PANEL WITH GFR - Abnormal; Notable for the following components:   Sodium 124 (*)    Chloride 94 (*)    Glucose, Bld 301 (*)    BUN 6 (*)    Calcium  7.8 (*)    Total  Protein 4.9 (*)    Albumin  2.5 (*)    AST 12 (*)    All other components within normal limits  PROTIME-INR - Abnormal; Notable for the following components:   Prothrombin Time 15.7 (*)    All other components within normal limits  APTT - Abnormal; Notable for the following components:   aPTT 38 (*)    All other components within normal limits  TYPE AND SCREEN    EKG: None  Radiology: DG Femur Min 2 Views Right Result Date: 12/17/2023 CLINICAL DATA:  Fall. EXAM: RIGHT FEMUR 2 VIEWS COMPARISON:  None Available. FINDINGS: Possible minimally displaced distal right femoral fracture is noted. Moderate size suprapatellar joint effusion is noted. Status post right total hip arthroplasty. Vascular calcifications are noted. IMPRESSION: Possible minimally displaced distal right femoral fracture. Moderate size joint effusion is noted. CT scan is recommended for further evaluation. Electronically Signed   By: Lynwood Landy Raddle M.D.   On: 12/17/2023 11:27   DG Tibia/Fibula Right Result Date: 12/17/2023 CLINICAL DATA:  Fall. EXAM: RIGHT TIBIA AND FIBULA - 2 VIEW COMPARISON:  None Available. FINDINGS: There is no evidence of fracture or other focal bone lesions. Soft tissues are unremarkable. IMPRESSION: Negative. Electronically Signed   By: Lynwood Landy Raddle M.D.   On: 12/17/2023 11:25   DG Pelvis 1-2 Views Result Date: 12/17/2023 CLINICAL DATA:  Fall. EXAM: PELVIS - 1-2 VIEW COMPARISON:  December 12, 2022. FINDINGS: Status post bilateral total hip arthroplasties. There is no evidence of pelvic fracture or diastasis. No pelvic bone lesions are seen. IMPRESSION: No acute abnormality seen. Electronically Signed   By: Lynwood Landy Raddle M.D.   On: 12/17/2023 11:24     Procedures   Medications Ordered in the ED  sodium chloride  0.9 % bolus 1,000 mL (has no administration in time range)  fentaNYL  (SUBLIMAZE ) injection 50 mcg (50 mcg Intravenous Given 12/17/23 1027)  fentaNYL  (SUBLIMAZE ) injection 50 mcg (50 mcg  Intravenous Given 12/17/23 1329)    Clinical Course as of 12/17/23 1529  Fri Dec 17, 2023  1026 EKG reviewed by me: Normal sinus rhythm without evidence of acute ischemia [AG]  1120 X-ray pelvis reviewed by me: Bilateral hip arthroplasties.  Pelvic ring intact.  No obvious fracture or dislocation [AG]  1122 X-ray right lower extremity reviewed by me with evidence of medial distal fibular fracture with minimal displacement [AG]  1213 Ortho consulted, pending reccs [  ] [AG]  1446 INR: 1.2 [AG]  1506 -s mech fall at facility 2 days ago Distal  femur fracture Ct and admit to med hyponat   [RC]  1507 Consult for admission placed [AG]    Clinical Course User Index [AG] Nada Chroman, DO [RC] Sharyne Darina RAMAN, MD                                 Medical Decision Making Amount and/or Complexity of Data Reviewed Labs: ordered. Decision-making details documented in ED Course. Radiology: ordered.  Risk Prescription drug management. Decision regarding hospitalization.   On initial evaluation patient is hemodynamically stable, afebrile and in mild distress secondary to right lower extremity pain.  Will pain control.  Differential diagnosis based upon patient's history and physical examination findings include acute fracture, dislocation or ligamentous injury.  Will obtain x-ray imaging to include the right lower extremity as well as the pelvis.  X-ray imaging with evidence of concern for right distal fracture.  Orthopedic surgery consulted who recommends obtaining CT imaging however does not believe acute intervention will be performed.  Patient's laboratory studies with evidence of hyponatremia therefore patient will be admitted to medicine with orthopedic surgery following.  Patient given fluid bolus secondary to hyponatremia.  At the time of admission patient was hemodynamically stable, afebrile and continued to not be in acute distress after pain control.     Final diagnoses:  Closed  fracture of distal end of right femur, unspecified fracture morphology, initial encounter [facility])  Hyponatremia    ED Discharge Orders     None       Chroman Nada DO Emergency Medicine PGY2    Nada Chroman, DO 12/17/23 1529    Neysa Caron PARAS, DO 12/17/23 [phone]

## 2023-12-17 NOTE — ED Notes (Signed)
 Pt changed of wet brief and linen.  Clean brief and dry linen in place.  Pt tolerated well with noted pain on rolling.

## 2023-12-17 NOTE — Inpatient Diabetes Management (Signed)
 Inpatient Diabetes Program Recommendations  AACE/ADA: New Consensus Statement on Inpatient Glycemic Control (2015)  Target Ranges:  Prepandial:   less than 140 mg/dL      Peak postprandial:   less than 180 mg/dL (1-2 hours)      Critically ill patients:  140 - 180 mg/dL   Lab Results  Component Value Date   GLUCAP 166 (H) 01/03/2023   HGBA1C 6.9 (H) 10/24/2022    Review of Glycemic Control  Latest Reference Range & Units 12/17/23 10:29  Glucose 70 - 99 mg/dL 698 (H)   Diabetes history: DM 2 Outpatient Diabetes medications: lantus  13 units qhs, Humalog  0-12 units tid Current orders for Inpatient glycemic control:  None being evaluated in the ED A1c 6.9% on 6/22  Inpatient Diabetes Program Recommendations:    -   Start Semglee  8 units -   Novolog  0-9 units q4 if NPO (tid + hs if eating)  Thanks,  Clotilda Bull RN, MSN, BC-ADM Inpatient Diabetes Coordinator Team Pager [phone] (8a-5p)

## 2023-12-17 NOTE — ED Triage Notes (Signed)
 Pt to ER via EMS form [facility] with reports of a fall 2 nights ago.  This AM pt had xrays performed, noted to have right distal femur fracture.  Pt arrives moaning in pain.

## 2023-12-17 NOTE — Hospital Course (Addendum)
 From SNF fall 2 days ago, mechanical  Contd right knee pain since fall XR show distal femur fx  Ortho will not do acute intervention, will cont to follow  Labs Fluid bolus for Na 124, no symptoms, AxOx3   *** Medication list taken from dispense history due to not being able to get in contact with [facility] and no MAR available in the ED Eliquis  2.5 mg twice daily Buspirone  5 mg 3 times daily Cholestyramine  lite packet twice daily Cyclosporine  0.05% eyedrops Insulin  glargine 10 units daily Levothyroxine  75 mcg daily Pantoprazole  20 mg every other day Phenytoin  300 mg nightly Primidone   250 mg every morning and 150 mg every afternoon Trazodone  25 mg nightly Verapamil  200 mg daily   8/18: Patient reports recently getting pain medicines. Denies lightheadedness, dizziness, or SOB. Has not had breakfast this morning. Unclear if pt had BM today, but had one documented yesterday.  Feels lousy today.   8/19- patient is not responding much to touch or questioning. States she is feeling sleepy. Denies dizziness, lightheadedness, or pain right nowl. Fairl.y controlledl. Sl.ightly confused, unsure of where she is.   ***ordered urea  tabs    8/21; patient reports feeling terrible this morningl. She is axox4l. Hard to distinguish where her pain isl. Denies abdominal pain, but does not specify if hip or l.eg pain is presentl. Endorses appetite but did not finish magic cup l.ast nightl.   - switch PPI to everyday

## 2023-12-17 NOTE — Progress Notes (Signed)
 Lead phlebotomist Celia aware for BMP w/ GFR to be drawn at this time and will notify her staff.

## 2023-12-17 NOTE — H&P (Addendum)
 Date: 12/17/2023               Patient Name:  [name] MRN: [id number]  DOB: 1929-05-16 Age / Sex: 89 y.o., female   PCP: Jolee Madelin Patch, MD         Medical Service: Internal Medicine Teaching Service         Attending Physician: Dr. Mliss Pouch      First Contact: An Nguyen, MS IV     Second Contact: Dr. Fairy Pool, DO          Pager Information: First Contact Pager: [phone]   Second Contact Pager: [phone]   SUBJECTIVE   Chief Complaint: Right Femoral Fracture   History of Present Illness: [name] is a 89 y.o. female with PMH of left femoral fracture s/p ORIF 12/12/2022, essential HTN, meningioma s/p resection, right breast cancer s/p lumpectomy and radiation, asthma, seizure disorder, and anemia who presented to the ED after a fall and landed on her right knee in a nursing facility 2 days ago.  The fall occurred when transferring and was a result of the aide not using the usual [facility] lift but picking up the patient himself. An XR was done at the facility and revealed a right femur fracture, and she was subsequently prompted to go to the ED. Right knee pain is on a 10/10 scale, non-radiating, worsened with movement, alleviated with non-activity. She has not taken anything to help with the pain. She describes herself as a picky eater and would turn down food because of the way they taste. There has been no change in oral intake before or after the fall.  She is bedbound/nonmobile at baseline so there has been no major change in her mobility however she has had persistent pain which prompted the x-ray as above and she was brought to the hospital.  Her sister is in the room with her and does not think there is any change in her mentation other than being tired and in pain from her baseline.  We were unable to get in touch with her skilled nursing facility for any further information on this incident or her medication list.  Patient has a history of multiple fractures with the  most recent being right hip fracture after a fall which resulted total hip arthroplasty in 2024. After the surgery, she did not perform recommended physical therapy. Since the beginning of this year, her activity has decreased has been wheelchair bound.   ED Course: Labs significant for: - Na 125, Hgb 8.7, Glucose 301, PT 15.7, PTT 38, BUN 6  Imaging: - XR: minimally displaced distal right femoral fracture and moderate size suprapatellar joint effusion  Received:  - Scheduled tylenol , fentanyl , 1L NS bolus  Consulted Orthopedic surgery   Past Medical History Hypertension T2DM Seizure disorder Essential tremor Right frontal meningioma s/p resection in 2015 Colon cancer s/p hemicolectomy in 2021 DCIS of right breast s/p lumpectomy and 2014 Hypothyroidism   Meds:  Medication list taken from dispense history due to not being able to get in contact with [facility] and no MAR available in the ED Eliquis  2.5 mg twice daily Buspirone  5 mg 3 times daily Cholestyramine  lite packet twice daily Cyclosporine  0.05% eyedrops Insulin  glargine 10 units daily Levothyroxine  75 mcg daily Pantoprazole  20 mg every other day Phenytoin  300 mg nightly Primidone   250 mg every morning and 150 mg every afternoon Trazodone  25 mg nightly Verapamil  200 mg daily  Past Surgical History Past Surgical History:  Procedure Laterality Date   ABDOMINAL HYSTERECTOMY     BIOPSY  11/07/2019   Procedure: BIOPSY;  Surgeon: Donnald Charleston, MD;  Location: [facility];  Service: Endoscopy;;   BRAIN SURGERY     BREAST BIOPSY Right 01/24/2013   Procedure: RE-EXCICION OF BREAST CANCER, ANTERIOR MARGINS;  Surgeon: Morene ONEIDA Olives, MD;  Location: [facility];  Service: General;  Laterality: Right;   BREAST LUMPECTOMY Right 2014   BREAST LUMPECTOMY WITH NEEDLE LOCALIZATION Right 01/17/2013   Procedure: BREAST LUMPECTOMY WITH NEEDLE LOCALIZATION;  Surgeon: Morene ONEIDA Olives, MD;  Location: [facility];  Service: General;   Laterality: Right;   BUNIONECTOMY Bilateral    CATARACT EXTRACTION W/ INTRAOCULAR LENS IMPLANT Right    CHOLECYSTECTOMY     COLONOSCOPY     COLONOSCOPY WITH PROPOFOL  N/A 11/07/2019   Procedure: COLONOSCOPY WITH PROPOFOL ;  Surgeon: Donnald Charleston, MD;  Location: [facility];  Service: Endoscopy;  Laterality: N/A;   CRANIOTOMY Right 10/18/2013   Procedure: CRANIOTOMY TUMOR EXCISION;  Surgeon: Catalina CHRISTELLA Stains, MD;  Location: [facility];  Service: Neurosurgery;  Laterality: Right;   ESOPHAGOGASTRODUODENOSCOPY (EGD) WITH PROPOFOL  N/A 11/07/2019   Procedure: ESOPHAGOGASTRODUODENOSCOPY (EGD) WITH PROPOFOL ;  Surgeon: Donnald Charleston, MD;  Location: [facility];  Service: Endoscopy;  Laterality: N/A;   EYE SURGERY     HERNIA REPAIR     IR THORACENTESIS ASP PLEURAL SPACE W/IMG GUIDE  04/01/2020   KNEE ARTHROSCOPY Bilateral    LAPAROSCOPIC RIGHT HEMI COLECTOMY Right 03/06/2020   Procedure: LAPAROSCOPIC RIGHT HEMI COLECTOMY WITH TAP BLOCK AND LYSIS OF ADHESIONS;  Surgeon: Teresa Lonni CHRISTELLA, MD;  Location: [facility];  Service: General;  Laterality: Right;   POLYPECTOMY     small adenomatous   TOTAL HIP ARTHROPLASTY Right 06/05/2020   Procedure: ARTHROPLASTY  ANTERIOR APPROACH. RIGHT HIP;  Surgeon: Fidel Rogue, MD;  Location: [facility];  Service: Orthopedics;  Laterality: Right;   TOTAL HIP ARTHROPLASTY Left 12/12/2022   Procedure: TOTAL HIP ARTHROPLASTY ANTERIOR APPROACH;  Surgeon: Fidel Rogue, MD;  Location: [facility];  Service: Orthopedics;  Laterality: Left;   ULNAR TUNNEL RELEASE      Social:  Lives at SNF Support: Sister  Level of Function: Limited, Wheelchair bound  PCP: Jolee Madelin Patch, MD  Substances: -Tobacco: None  -Alcohol : None  -Recreational Drug: None   Family History:  Family History  Problem Relation Age of Onset   Heart disease Mother    Osteoporosis Mother    Diabetes Father    Pancreatic cancer Father    Bone cancer Sister    Rectal cancer Sister    Thyroid   disease Sister        benign goiter resected   Prostate cancer Brother    Colon cancer Brother    Colon cancer Other      Allergies: Allergies as of 12/17/2023 - Review Complete 12/17/2023  Allergen Reaction Noted   Bystolic [nebivolol hcl] Other (See Comments) 01/04/2013   Carbamazepine Other (See Comments) 12/16/2006   Cholestyramine  Itching, Nausea And Vomiting, Rash, and Other (See Comments) 12/16/2006   Morphine  Other (See Comments) 12/16/2006   Niacin Palpitations 12/16/2006   Norvasc [amlodipine besylate] Other (See Comments) 01/04/2013   Optivar [azelastine hcl] Photosensitivity 01/04/2013   Repaglinide  Hives 06/03/2016   Sular [nisoldipine er] Other (See Comments) 01/04/2013   Telmisartan Other (See Comments) 12/16/2006   Amoxicillin-pot clavulanate Rash 04/22/2019   Cefdinir Swelling 12/16/2006   Ciprofloxacin  hcl Hives 04/22/2019   Clonidine Other (See Comments)    Codeine Nausea  And Vomiting 12/16/2006   Ezetimibe Other (See Comments) 12/16/2006   Hydroxychloroquine Other (See Comments) 04/22/2019   Naproxen Other (See Comments) 12/16/2006   Sulfa  antibiotics Rash 04/22/2019   Ziac [bisoprolol-hydrochlorothiazide] Other (See Comments) 01/04/2013   Amlodipine besy-benazepril hcl Cough 02/10/2020   Azelastine Other (See Comments) 06/21/2019   Elavil [amitriptyline] Other (See Comments) 03/13/2014   Empagliflozin Other (See Comments) 06/26/2019   Hydralazine  Other (See Comments) 10/25/2018   Iodine  i-131 tositumomab Other (See Comments) 04/01/2020   Kenalog  [triamcinolone ] Diarrhea 08/08/2019   Keppra  [levetiracetam ] Other (See Comments) 01/05/2014   Lamotrigine  Itching and Other (See Comments) 10/30/2014   Pravastatin Other (See Comments) 12/17/2023   Pregabalin  Swelling and Other (See Comments) 03/13/2014   Pseudoephedrine Other (See Comments) 10/25/2018   Risedronate  04/01/2020   Ru-hist d [brompheniramine-phenylephrine ] Other (See Comments) 04/01/2020    Sulfur Other (See Comments) 01/04/2013   Sumatriptan Other (See Comments) 02/10/2020   Topiramate  Other (See Comments) 11/12/2014   Ace inhibitors Other (See Comments) 10/20/2013   Aspirin  Other (See Comments) 12/16/2006   Atacand [candesartan] Other (See Comments) 01/04/2013   Bextra [valdecoxib] Other (See Comments) 01/04/2013   Bisoprolol-hydrochlorothiazide Other (See Comments) 12/16/2006   Cefadroxil Other (See Comments) 12/16/2006   Celecoxib Rash 12/16/2006   Hydrocodone  Other (See Comments) 12/16/2006   Iodinated contrast media Rash and Other (See Comments) 01/03/2013   Meloxicam Other (See Comments) 12/16/2006   Methylprednisolone  sodium succinate  Other (See Comments)    Nabumetone Other (See Comments) 12/16/2006   Penicillins Other (See Comments) 12/16/2006   Pseudoephedrine-guaifenesin  Other (See Comments) 12/16/2006   Rofecoxib Other (See Comments) 12/16/2006   Ru-tuss [chlorphen-pse-atrop-hyos-scop] Other (See Comments) 01/04/2013   Sulfonamide derivatives Other (See Comments)    Sulphur [elemental sulfur] Other (See Comments) 01/04/2013   Telithromycin Other (See Comments) 12/16/2006   Terfenadine Other (See Comments) 12/16/2006   Trandolapril -verapamil  hcl er Other (See Comments) 12/16/2006   Valium [diazepam] Other (See Comments) 01/03/2013    Review of Systems: A complete ROS was negative except as per HPI.   OBJECTIVE:   Physical Exam: Blood pressure (!) 140/78, pulse 90, temperature 98 F (36.7 C), resp. rate (!) 22, height 5' 8 (1.727 m), weight 68 kg, SpO2 98%.  Constitutional: Uncomfortable appearing elderly female laying in bed, in no acute distress HENT: normocephalic atraumatic Cardiovascular: regular rate and rhythm, 2+ radial and dorsalis pedis pulses bilaterally Pulmonary/Chest: normal work of breathing on room air, lungs clear to auscultation bilaterally Abdominal: soft, non-tender, non-distended MSK: mild pitting edema, right knee swelling with  effusion, but no erythema, tender to palpation and patient unwilling to do active or passive range of motion due to pain Neurological: alert & oriented to person, place, and situation (did not assess time), moves bilateral upper extremities independently without issue, neurovascularly intact in bilateral lower extremities Skin: warm and dry, bandages over bilateral heels   Labs: CBC    Component Value Date/Time   WBC 10.0 12/17/2023 1029   RBC 2.90 (L) 12/17/2023 1029   HGB 8.7 (L) 12/17/2023 1029   HGB 12.5 01/21/2023 1637   HGB 12.7 01/04/2013 1212   HCT 25.9 (L) 12/17/2023 1029   HCT 37.9 01/21/2023 1637   HCT 37.7 01/04/2013 1212   PLT 259 12/17/2023 1029   PLT 426 01/21/2023 1637   MCV 89.3 12/17/2023 1029   MCV 91 01/21/2023 1637   MCV 89.2 01/04/2013 1212   MCH 30.0 12/17/2023 1029   MCHC 33.6 12/17/2023 1029   RDW 15.4 12/17/2023 1029  RDW 13.9 01/21/2023 1637   RDW 13.9 01/04/2013 1212   LYMPHSABS 1.7 12/17/2023 1029   LYMPHSABS 1.8 12/24/2020 1143   LYMPHSABS 1.7 01/04/2013 1212   MONOABS 0.7 12/17/2023 1029   MONOABS 0.7 01/04/2013 1212   EOSABS 0.1 12/17/2023 1029   EOSABS 0.1 12/24/2020 1143   BASOSABS 0.0 12/17/2023 1029   BASOSABS 0.0 12/24/2020 1143   BASOSABS 0.0 01/04/2013 1212     CMP     Component Value Date/Time   NA 124 (L) 12/17/2023 1029   NA 130 (L) 12/24/2020 1143   NA 138 01/04/2013 1212   K 3.5 12/17/2023 1029   K 3.9 01/04/2013 1212   CL 94 (L) 12/17/2023 1029   CO2 22 12/17/2023 1029   CO2 22 01/04/2013 1212   GLUCOSE 301 (H) 12/17/2023 1029   GLUCOSE 285 (H) 01/04/2013 1212   BUN 6 (L) 12/17/2023 1029   BUN 14 12/24/2020 1143   BUN 10.3 01/04/2013 1212   CREATININE 0.46 12/17/2023 1029   CREATININE 0.78 12/01/2013 1659   CREATININE 1.0 01/04/2013 1212   CALCIUM  7.8 (L) 12/17/2023 1029   CALCIUM  9.6 01/04/2013 1212   PROT 4.9 (L) 12/17/2023 1029   PROT 6.3 12/24/2020 1143   PROT 6.4 01/04/2013 1212   ALBUMIN  2.5 (L)  12/17/2023 1029   ALBUMIN  4.2 12/24/2020 1143   ALBUMIN  3.6 01/04/2013 1212   AST 12 (L) 12/17/2023 1029   AST 14 01/04/2013 1212   ALT 12 12/17/2023 1029   ALT 16 01/04/2013 1212   ALKPHOS 57 12/17/2023 1029   ALKPHOS 52 01/04/2013 1212   BILITOT 0.3 12/17/2023 1029   BILITOT <0.2 12/24/2020 1143   BILITOT 1.01 01/04/2013 1212   GFRNONAA >60 12/17/2023 1029   GFRAA >60 01/02/2020 0455    Imaging:  DG Femur Min 2 Views Right Result Date: 12/17/2023 CLINICAL DATA:  Fall. EXAM: RIGHT FEMUR 2 VIEWS COMPARISON:  None Available. FINDINGS: Possible minimally displaced distal right femoral fracture is noted. Moderate size suprapatellar joint effusion is noted. Status post right total hip arthroplasty. Vascular calcifications are noted. IMPRESSION: Possible minimally displaced distal right femoral fracture. Moderate size joint effusion is noted. CT scan is recommended for further evaluation. Electronically Signed   By: Lynwood Landy Raddle M.D.   On: 12/17/2023 11:27   DG Tibia/Fibula Right Result Date: 12/17/2023 CLINICAL DATA:  Fall. EXAM: RIGHT TIBIA AND FIBULA - 2 VIEW COMPARISON:  None Available. FINDINGS: There is no evidence of fracture or other focal bone lesions. Soft tissues are unremarkable. IMPRESSION: Negative. Electronically Signed   By: Lynwood Landy Raddle M.D.   On: 12/17/2023 11:25   DG Pelvis 1-2 Views Result Date: 12/17/2023 CLINICAL DATA:  Fall. EXAM: PELVIS - 1-2 VIEW COMPARISON:  December 12, 2022. FINDINGS: Status post bilateral total hip arthroplasties. There is no evidence of pelvic fracture or diastasis. No pelvic bone lesions are seen. IMPRESSION: No acute abnormality seen. Electronically Signed   By: Lynwood Landy Raddle M.D.   On: 12/17/2023 11:24     ASSESSMENT & PLAN:   Assessment & Plan by Problem: Principal Problem:   Femur fracture (HCC) Normocytic Anemia  Acute on Chronic Hyponatremia  T2DM  Hypothyroidism  Anxiety  Seizure Disorder   [name] is a 89 y.o. female  with PMH of left femoral fracture s/p ORIF 12/12/2022, essential HTN, meningioma s/p resection, right breast cancer s/p lumpectomy and radiation, asthma, seizure disorder, and anemia who presented to the ED after a fall and admitted for  right femoral fracture on hospital day 0.   Right Femoral Fracture Patient presents with a fall during transfer without a [facility] lift. XR showed displaced distal right femoral fracture with moderate suprapatellar joint effusion. CT right knee showed decrease bone density, acute displaced fracture of medial distal femoral metadiaphysis and medial condyle that extends to the femoral intercondylar fossa, moderate joint effusion, and possible hemarthrosis.  Orthopedics have evaluated and have yet to review CT scan for determination of need for any procedures.   - Orthopedic surgery consulted   - Scheduled tylenol  1000 mg every 8 hours  - Hydromorphone  0.25 mg every 2 hours as needed for breakthrough pain  - Oxycodone  2.5 mg every 3 hours as needed for severe pain  Acute on Chronic Hyponatremia  Na 125. Her Na has been in the 125 -129 over the past few years. There has been no decrease in oral intake. Highest suspicion is still low solute intake due to the chronicity with a possible component of SIADH in the setting of pain.  No signs of volume overload or very significant hypovolemia, hypothyroidism is well treated with stable levothyroxine  dose.  She received a bolus of normal saline in the ED.  No urine labs were collected before fluids were given. - Repeat BMP tonight and in the morning, if worsening sodium level tonight we will start fluid restriction  Normocytic Anemia  Hgb 8.7. Hct 25.9. She is stable and there are no signs of bleeding. Her hemoglobin levels have been the high 9s for the past year. She is on Eliquis  2.5 mg it seems since her last lower extremity fracture for possible DVT prophylaxis.  No signs of hemarthrosis on CT scan  - Monitor with CBC  - Continue  Eliquis  2.5 mg daily but we will work to clarify this before discharge to see if it can be discontinued  - Transfuse if Hgb < 7  Well-controlled Type 2 Diabetes Mellitus  Hemoglobin A1c a year ago 6.9. Previously on Lantus  10 units and Novolog  2 units   - Start sensitive sliding scale with glargine 5 units   Essential Tremor Her tremor has affected her ability to eat and feed herself, leading to having to rely on others for feeding and ambulation   -  Continue primidone  250 mg every morning and 150 mg every afternoon   Hypothyroidism TSH 2.9 a year ago and she has been stable on Levothyroxine  74 mcg.  - Continue Levothyroxine  75 mcg daily   Anxiety   - Continue buspirone  5 mg 3 time daily    Seizure Disorder   - Continue phenytoin  300 mg nightly   Chronic deconditioning and immobility Patient is immobile at baseline and right now has protective dressings on her heels.  These were not removed for evaluation during initial physical exam.  Remainder of her feet do appear well cared for. - Wound care consult to prevent heel wounds, continue protective heel dressing  Best practice: Diet: Thin  VTE: Eliquis  2.5 mg Code: DNR  Disposition planning: Prior to Admission Living Arrangement: SNF,   Anticipated Discharge Location: SNF Barriers to Discharge: Femoral fracture awaiting intervention  Dispo: Admit patient to Observation with expected length of stay less than 2 midnights.  Signed: Nguyen, An T, Medical Student Internal Medicine Resident  12/17/2023, 4:31 PM  Please contact IM Residency On-Call Pager at: [phone] or [phone].   Attestation for Student Documentation:  I personally was present and re-performed the history, physical exam and medical decision-making activities of this  service and have verified that the service and findings are accurately documented in the student's note.  Jolaine Pac, DO 12/17/2023, 7:05 PM

## 2023-12-17 NOTE — ED Notes (Signed)
 Called floor and made aware of PT arrival.

## 2023-12-18 DIAGNOSIS — Z79890 Hormone replacement therapy (postmenopausal): Secondary | ICD-10-CM

## 2023-12-18 DIAGNOSIS — S72401A Unspecified fracture of lower end of right femur, initial encounter for closed fracture: Secondary | ICD-10-CM | POA: Diagnosis not present

## 2023-12-18 DIAGNOSIS — E871 Hypo-osmolality and hyponatremia: Secondary | ICD-10-CM | POA: Diagnosis not present

## 2023-12-18 DIAGNOSIS — Z79899 Other long term (current) drug therapy: Secondary | ICD-10-CM

## 2023-12-18 DIAGNOSIS — Z7901 Long term (current) use of anticoagulants: Secondary | ICD-10-CM

## 2023-12-18 DIAGNOSIS — D649 Anemia, unspecified: Secondary | ICD-10-CM | POA: Diagnosis not present

## 2023-12-18 DIAGNOSIS — G250 Essential tremor: Secondary | ICD-10-CM

## 2023-12-18 DIAGNOSIS — Z79891 Long term (current) use of opiate analgesic: Secondary | ICD-10-CM

## 2023-12-18 DIAGNOSIS — G40909 Epilepsy, unspecified, not intractable, without status epilepticus: Secondary | ICD-10-CM

## 2023-12-18 DIAGNOSIS — W19XXXA Unspecified fall, initial encounter: Secondary | ICD-10-CM | POA: Diagnosis not present

## 2023-12-18 DIAGNOSIS — E119 Type 2 diabetes mellitus without complications: Secondary | ICD-10-CM

## 2023-12-18 DIAGNOSIS — E039 Hypothyroidism, unspecified: Secondary | ICD-10-CM

## 2023-12-18 DIAGNOSIS — F419 Anxiety disorder, unspecified: Secondary | ICD-10-CM

## 2023-12-18 LAB — GLUCOSE, CAPILLARY
Glucose-Capillary: 187 mg/dL — ABNORMAL HIGH (ref 70–99)
Glucose-Capillary: 196 mg/dL — ABNORMAL HIGH (ref 70–99)
Glucose-Capillary: 188 mg/dL — ABNORMAL HIGH (ref 70–99)
Glucose-Capillary: 169 mg/dL — ABNORMAL HIGH (ref 70–99)

## 2023-12-18 LAB — BASIC METABOLIC PANEL WITH GFR
Anion gap: 9 (ref 5–15)
BUN: 7 mg/dL — ABNORMAL LOW (ref 8–23)
CO2: 21 mmol/L — ABNORMAL LOW (ref 22–32)
Calcium: 7.9 mg/dL — ABNORMAL LOW (ref 8.9–10.3)
Chloride: 95 mmol/L — ABNORMAL LOW (ref 98–111)
Creatinine, Ser: 0.39 mg/dL — ABNORMAL LOW (ref 0.44–1.00)
GFR, Estimated: >60 mL/min (ref >60)
Glucose, Bld: 170 mg/dL — ABNORMAL HIGH (ref 70–99)
Potassium: 3.6 mmol/L (ref 3.5–5.1)
Sodium: 125 mmol/L — ABNORMAL LOW (ref 135–145)

## 2023-12-18 LAB — CBC
HCT: 26.8 % — ABNORMAL LOW (ref 36.0–46.0)
Hemoglobin: 9.1 g/dL — ABNORMAL LOW (ref 12.0–15.0)
MCH: 29.7 pg (ref 26.0–34.0)
MCHC: 34 g/dL (ref 30.0–36.0)
MCV: 87.6 fL (ref 80.0–100.0)
Platelets: 272 K/uL (ref 150–400)
RBC: 3.06 MIL/uL — ABNORMAL LOW (ref 3.87–5.11)
RDW: 15.2 % (ref 11.5–15.5)
WBC: 7.8 K/uL (ref 4.0–10.5)
nRBC: 0 % (ref 0.0–0.2)

## 2023-12-18 MED ORDER — SENNOSIDES-DOCUSATE SODIUM 8.6-50 MG PO TABS: 1.0000 | ORAL_TABLET | Freq: Every evening | ORAL | Status: DC | PRN

## 2023-12-18 MED ORDER — POTASSIUM CHLORIDE 20 MEQ PO PACK
40.0000 meq | PACK | Freq: Once | ORAL | Status: AC
  Administered 2023-12-18: 40 meq via ORAL
  Filled 2023-12-18: qty 2

## 2023-12-18 NOTE — Progress Notes (Signed)
 8/16 Patient unable to sign due to feebleness and weakness.Patient's hans extremely shaky.

## 2023-12-18 NOTE — Care Management Obs Status (Signed)
 MEDICARE OBSERVATION STATUS NOTIFICATION   Patient Details  Name: [name] MRN: [id number] Date of Birth: Suell 17, 1931   Medicare Observation Status Notification Given:  Yes    Jon Cruel 12/18/2023, 10:28 AM

## 2023-12-18 NOTE — Progress Notes (Addendum)
 HD#0 SUBJECTIVE:  Patient Summary: [name] is a 89 y.o. with a pertinent PMH of left femoral neck fracture s/p ORIF, essential HTN, meningioma s/p resection, right breast cancer s/p lumpectomy and radiation, asthma, seizure disorder, and anemia who presented to the ED after a fall and admitted right femoral fracture.   Overnight Events: None  Interim History: Patient reports much improvement in her pain and seems much more comfortable. She denies chest pain, shortness of breath, fever and chills. There was also no urinary or bowel dysfunctions.   OBJECTIVE:  Vital Signs: Vitals:   12/17/23 2034 12/17/23 2343 12/18/23 0500 12/18/23 0821  BP: 134/75 (!) 136/59 (!) 142/79 138/71  Pulse: 84 (!) 55 79 82  Resp: 18 18 18 16   Temp: (!) 97.4 F (36.3 C) 97.7 F (36.5 C) 97.6 F (36.4 C) (!) 97.5 F (36.4 C)  TempSrc: Oral   Oral  SpO2: 98% 98% 100% 100%  Weight:   76.2 kg   Height:       Supplemental O2: None SpO2: 100 % O2 Flow Rate (L/min): 0 L/min FiO2 (%): 21 %  Filed Weights   12/17/23 1023 12/18/23 0500  Weight: 68 kg 76.2 kg    No intake or output data in the 24 hours ending 12/18/23 1320 Net IO Since Admission: No IO data has been entered for this period [12/18/23 1320]  Physical Exam: Physical Exam Constitutional:      General: She is not in acute distress.    Appearance: She is not ill-appearing or toxic-appearing.  Cardiovascular:     Rate and Rhythm: Normal rate and regular rhythm.     Pulses: Normal pulses.     Heart sounds: Normal heart sounds.  Pulmonary:     Effort: Pulmonary effort is normal. No respiratory distress.     Breath sounds: Normal breath sounds.  Musculoskeletal:     Comments: Right knee showed more warmth and swelling compared to the left. Strong distal pulses.   Neurological:     Mental Status: Mental status is at baseline.  Psychiatric:        Mood and Affect: Mood normal.        Behavior: Behavior normal.    Patient  Lines/Drains/Airways Status     Active Line/Drains/Airways     Name Placement date Placement time Site Days   Peripheral IV 12/17/23 20 G Left Antecubital 12/17/23  1027  Antecubital  1   Wound / Incision (Open or Dehisced) 12/26/22 Heel Right unstageable 12/26/22  1800  Heel  357            Pertinent labs and imaging:      Latest Ref Rng & Units 12/18/2023    7:58 AM 12/17/2023   10:29 AM 01/21/2023    4:37 PM  CBC  WBC 4.0 - 10.5 K/uL 7.8  10.0  9.6   Hemoglobin 12.0 - 15.0 g/dL 9.1  8.7  87.4   Hematocrit 36.0 - 46.0 % 26.8  25.9  37.9   Platelets 150 - 400 K/uL 272  259  426        Latest Ref Rng & Units 12/18/2023    7:58 AM 12/17/2023    6:03 PM 12/17/2023   10:29 AM  CMP  Glucose 70 - 99 mg/dL 829  769  698   BUN 8 - 23 mg/dL 7  6  6    Creatinine 0.44 - 1.00 mg/dL 9.60  9.59  9.53   Sodium 135 - 145  mmol/L 125  125  124   Potassium 3.5 - 5.1 mmol/L 3.6  3.3  3.5   Chloride 98 - 111 mmol/L 95  92  94   CO2 22 - 32 mmol/L 21  22  22    Calcium  8.9 - 10.3 mg/dL 7.9  8.0  7.8   Total Protein 6.5 - 8.1 g/dL   4.9   Total Bilirubin 0.0 - 1.2 mg/dL   0.3   Alkaline Phos 38 - 126 U/L   57   AST 15 - 41 U/L   12   ALT 0 - 44 U/L   12     CT KNEE RIGHT WO CONTRAST Result Date: 12/17/2023 CLINICAL DATA:  Knee trauma, occult fracture suspected, xray done EXAM: CT OF THE RIGHT KNEE WITHOUT CONTRAST TECHNIQUE: Multidetector CT imaging of the right knee was performed according to the standard protocol. Multiplanar CT image reconstructions were also generated. RADIATION DOSE REDUCTION: This exam was performed according to the departmental dose-optimization program which includes automated exposure control, adjustment of the mA and/or kV according to patient size and/or use of iterative reconstruction technique. COMPARISON:  X-ray tibia fibula 12/17/2023. x-ray right femur 12/17/2023 FINDINGS: Bones/Joint/Cartilage Diffusely decreased bone density. Acute displaced fracture of the  medial distal femoral metadiaphysis and medial condyle (8:34; 7:44). Fracture extends to the femoral intercondylar fossa left bowel wall (7:39). No tibial plateau fracture. No proximal fibular fracture. No dislocation. Moderate joint effusion. Possible eval vein hemarthrosis with fluid-fluid level within the joint effusion. Ligaments Suboptimally assessed by CT. Muscles and Tendons Grossly unremarkable. Soft tissues Subcutaneus soft tissue edema.  Atherosclerotic plaque. IMPRESSION: 1. Acute displaced fracture of the medial distal femoral metadiaphysis and medial condyle with extension to the femoral intercondylar fossa. 2. Moderate joint effusion. Electronically Signed   By: Morgane  Naveau M.D.   On: 12/17/2023 17:09    ASSESSMENT/PLAN:  Assessment: Principal Problem:   Femur fracture (HCC) Normocytic Anemia  Acute on Chronic Hyponatremia  T2DM  Hypothyroidism  Anxiety  Seizure Disorder  Plan: [name] is a 89 y.o. female with PMH of left femoral fracture s/p ORIF 12/12/2022, essential HTN, meningioma s/p resection, right breast cancer s/p lumpectomy and radiation, asthma, seizure disorder, and anemia who presented to the ED after a fall and admitted for right femoral fracture on hospital day 0.    Right Femoral Fracture Patient presents with a fall during transfer without a Hoyer lift. XR showed displaced distal right femoral fracture with moderate suprapatellar joint effusion. CT right knee showed decrease bone density, acute displaced fracture of medial distal femoral metadiaphysis and medial condyle that extends to the femoral intercondylar fossa, moderate joint effusion, and possible hemarthrosis.    Patient reports improvement on her pain today and seems much more comfortable. Orthopedic surgery recommended non-surgical management. Will proceed with her current pain regimen.             - Scheduled tylenol  1000 mg every 8 hours            - Hydromorphone  0.25 mg every 2 hours as needed  for breakthrough pain            - Oxycodone  2.5 mg every 3 hours as needed for severe pain  - Polyethylene glycol 17 g daily for bowel regimen   Hypokalemia, resolved  K 3.3 this morning. Level has improved to 3.6 after 40 mEq of potassium chloride .   Acute on Chronic Hyponatremia  Her Na baseline has been in the 125 -129  over the past few years. There has been no decrease in oral intake after the fall. Highest suspicion is low solute intake with consideration for SIADH in the setting of pain, although levels did not worsen after fluid administration. There are no signs of symptomatic hyponatremia and she is tolerating solid and liquid intake well. Will focus on pain management and monitor for worsening Na level. She is resuming diet. - Repeat BMP today   Normocytic Anemia  Hgb 9.1 She is stable and there are no signs of bleeding. Her hemoglobin levels have been the high 9s for the past year. She is on Eliquis  2.5 mg it seems since her last lower extremity fracture for possible DVT prophylaxis.  No signs of hemarthrosis on CT scan             - Monitor with CBC             - Continue Eliquis  2.5 mg daily, while we attempt to contact the nursing facility for the correct list of medication             - Transfuse if Hgb < 7   Well-controlled Type 2 Diabetes Mellitus  Hemoglobin A1c a year ago 6.9. Previously on Lantus  10 units and Novolog  2 units              - Start sensitive sliding scale with glargine 5 units and aspart 0-9 units    Essential Tremor Her tremor has affected her ability to eat and feed herself, leading to having to rely on others for feeding and ambulation.              -  Continue primidone  250 mg every morning and 150 mg every afternoon    Hypothyroidism TSH 2.9 a year ago and she has been stable on Levothyroxine  74 mcg.             - Continue Levothyroxine  75 mcg daily    Anxiety              - Continue buspirone  5 mg 3 time daily               Seizure Disorder               - Continue phenytoin  300 mg nightly    Chronic Deconditioning and Immobility Patient is immobile and wheelchair-bound at baseline and right now has protective dressings on her heels.  - Wound care consult to prevent heel wounds, continue protective heel dressing   Best Practice: Diet: Regular diet IVF: Fluids:  VTE: apixaban  (ELIQUIS ) tablet 2.5 mg Start: 12/17/23 2200 Code: DNR  Disposition planning: Therapy Recs: None, DME: Knee Immobilizer  Family Contact: Sister, to be notified. DISPO: Anticipated discharge in 2 days to Skilled nursing facility pending Resolution of Pain and Restore of function.  Signature:  An ONEIDA Miyamoto, medical student [facility]  1:20 PM, 12/18/2023  On Call pager [phone]   Attestation for Student Documentation:  I personally was present and re-performed the history, physical exam and medical decision-making activities of this service and have verified that the service and findings are accurately documented in the student's note.  Harrie Bruckner, DO IM Resident PGY-2 12/18/2023, 2:28 PM

## 2023-12-18 NOTE — Progress Notes (Signed)
 Orthopedic Tech Progress Note Patient Details:  [name] 1929/07/09 [id number]  Ortho Devices Type of Ortho Device: Knee Immobilizer Ortho Device/Splint Location: RLE Ortho Device/Splint Interventions: Ordered, Application, Adjustment   Post Interventions Patient Tolerated: Fair Instructions Provided: Care of device, Adjustment of device  [name] 12/18/2023, 10:45 AM

## 2023-12-18 NOTE — Evaluation (Signed)
 Occupational Therapy Evaluation Patient Details Name: [name] MRN: [id number] DOB: Jun 08, 1929 Today's Date: 12/18/2023   History of Present Illness   [name] is a 89 y.o. female who presented to [facility] ED 8/15 after a fall. X-ray showed minimally displaced distal right femoral fracture and moderate size suprapatellar joint effusion. CT of right knee demonstrated acute displaced fracture of the medial distal femoral  metadiaphysis and medial condyle with extension to the femoral  intercondylar fossa. PMHx: essential HTN, meningioma s/p resection, right breast cancer s/p lumpectomy and radiation, asthma, seizure disorder, and anemia; left femoral fracture s/p ORIF 12/12/2022.     Clinical Impressions At baseline, pt able to self-feed and perform oral care with Set-up and completes all other ADLs with Min to Max assist of +1 to +2. At baseline, pt requires a hoyer-style lift form transfers and uses a manual w/c for functional mobility. Pt is near baseline PLOF, but now presents with decreased activity tolerance, decreased sitting tolerance, decrease sitting balance, generalized B UE weakness, decreased B UE fine motor coordination, pain affecting functional level, and decreased safety and independence with functional tasks. Pt currently requiring largely Min to Total assist +2 for ADLs from bed level and bed mobility with Max +2 to Total assist +2. Pt will benefit from acute skilled OT services to address deficits and increase safety and independence with ADLs. Post acute discharge, pt will benefit from return to SNF with intensive inpatient skilled rehab services < 3 hours per day to maximize rehab potential.      If plan is discharge home, recommend the following:   Two people to help with walking and/or transfers;Two people to help with bathing/dressing/bathroom;Assistance with cooking/housework;Assistance with feeding;Direct supervision/assist for medications management;Direct supervision/assist  for financial management;Assist for transportation;Help with stairs or ramp for entrance     Functional Status Assessment   Patient has had a recent decline in their functional status and demonstrates the ability to make significant improvements in function in a reasonable and predictable amount of time.     Equipment Recommendations   Other (comment) (defer to next level of care)     Recommendations for Other Services         Precautions/Restrictions   Precautions Precautions: Fall Recall of Precautions/Restrictions: Impaired Required Braces or Orthoses: Knee Immobilizer - Right Knee Immobilizer - Right: On at all times Restrictions Weight Bearing Restrictions Per Provider Order: Yes RLE Weight Bearing Per Provider Order: Non weight bearing     Mobility Bed Mobility Overal bed mobility: Needs Assistance Bed Mobility: Supine to Sit, Sit to Supine     Supine to sit: Max assist, +2 for physical assistance, +2 for safety/equipment, HOB elevated Sit to supine: Max assist, Total assist, +2 for physical assistance, +2 for safety/equipment   General bed mobility comments: Pt requring cues for hand placement/technique and encouragement for participation throughout    Transfers Overall transfer level: Needs assistance                 General transfer comment: Pt requires a Hoyer-style mechanical lift for transfers      Balance Overall balance assessment: Needs assistance, History of Falls Sitting-balance support: Bilateral upper extremity supported, Feet supported Sitting balance-Leahy Scale: Poor Sitting balance - Comments: Pt requiring Mod to Max assist for balance sitting EOB Postural control: Posterior lean (Pt with heavy posterior lean; suspect pain is a factor)  ADL either performed or assessed with clinical judgement   ADL Overall ADL's : Needs assistance/impaired Eating/Feeding: Minimal assistance;Bed  level (cues for participation) Eating/Feeding Details (indicate cue type and reason): pt would likely benefit from AE for self-feeding Grooming: Minimal assistance;Bed level (cues for participation)   Upper Body Bathing: Moderate assistance;Maximal assistance;Bed level;Cueing for compensatory techniques (cues for participation)   Lower Body Bathing: Total assistance;+2 for physical assistance;+2 for safety/equipment;Bed level   Upper Body Dressing : Moderate assistance;Maximal assistance;Cueing for compensatory techniques;Bed level (cues for participation)   Lower Body Dressing: Total assistance;+2 for physical assistance;+2 for safety/equipment;Bed level     Toilet Transfer Details (indicate cue type and reason): unable; uses hoyer lift at baseline and reports completing toileting from bed level Toileting- Clothing Manipulation and Hygiene: Total assistance;+2 for physical assistance;+2 for safety/equipment;Bed level         General ADL Comments: Pt with decreased activity tolerance and limited by pain     Vision   Vision Assessment?: No apparent visual deficits Additional Comments: Vision [facility] for tasks assessed; not formally screened or assessed     Perception         Praxis         Pertinent Vitals/Pain Pain Assessment Pain Assessment: Faces Faces Pain Scale: Hurts whole lot Pain Location: R LE Pain Descriptors / Indicators: Discomfort, Grimacing, Guarding, Moaning, Sore, Aching Pain Intervention(s): Limited activity within patient's tolerance, Monitored during session, Repositioned, Patient requesting pain meds-RN notified, Relaxation (breathing techniques)     Extremity/Trunk Assessment Upper Extremity Assessment Upper Extremity Assessment: Right hand dominant;Generalized weakness;RUE deficits/detail;LUE deficits/detail RUE Deficits / Details: generalized weakness; decreased fine motor coordination; noted mild tremor with purposeful movement with pt reporting this  is baseline and varies in intensity from day to day RUE Coordination: decreased fine motor LUE Deficits / Details: generalized weakness; decreased fine motor coordination; noted mild tremor with purposeful movement with pt reporting this is baseline and varies in intensity from day to day LUE Coordination: decreased fine motor   Lower Extremity Assessment Lower Extremity Assessment: Defer to PT evaluation   Cervical / Trunk Assessment Cervical / Trunk Assessment: Kyphotic   Communication Communication Communication: No apparent difficulties   Cognition Arousal: Alert Behavior During Therapy: WFL for tasks assessed/performed, Flat affect Cognition: Cognition impaired     Awareness: Intellectual awareness intact, Online awareness impaired   Attention impairment (select first level of impairment): Alternating attention Executive functioning impairment (select all impairments): Reasoning, Problem solving, Organization OT - Cognition Comments: Pt AAOx4. Pt with poor insight into deficits and poor understanding of role/importance of active participation in skilled rehab services.                 Following commands: Intact       Cueing  General Comments   Cueing Techniques: Verbal cues;Gestural cues;Visual cues;Tactile cues  Pt's sister present and supportive throughout session. RN present for a portion of session.   Exercises     Shoulder Instructions      Home Living Family/patient expects to be discharged to:: Skilled nursing facility                                 Additional Comments: Pt in SNF since fall with L femoral fx requiring ORIF in 12/2022. Pt reports she was still receiving short-term rehab at SNF just prior to this admission. Plan to return to SNF post-acute discharge.      Prior  Functioning/Environment Prior Level of Function : Needs assist             Mobility Comments: At SNF, pt was using a hoyer ift for transfers and a manual  w/c for mobiity. Per chart review, staff attempted to transfer pt without lift leading to fall. Per pt's sister and chart review, pt has had poor participation in rehab services at [facility]. Pt with 1 fall in past 6 months leading to this admission and hx of fall in 12/2022 with L femoral fx requiring ORIF in 12/2022. ADLs Comments: At baseline, pt requires Set up for self-feeding and oral care and  Min to Max assist +1 to +2 assist for all other ADLs. Pt receives assist from family or staff for all IADLs.    OT Problem List: Decreased strength;Decreased activity tolerance;Impaired balance (sitting and/or standing);Decreased coordination;Decreased safety awareness;Decreased knowledge of use of DME or AE;Decreased knowledge of precautions;Pain   OT Treatment/Interventions: Self-care/ADL training;Therapeutic exercise;DME and/or AE instruction;Therapeutic activities;Patient/family education;Balance training      OT Goals(Current goals can be found in the care plan section)   Acute Rehab OT Goals Patient Stated Goal: to have less pain OT Goal Formulation: With patient/family Time For Goal Achievement: 01/01/24 Potential to Achieve Goals: Fair ADL Goals Pt Will Perform Eating: with set-up;sitting;with adaptive utensils (with adaptive equipment; sitting with Fair balance for 3 or more minutes) Pt Will Perform Grooming: with set-up;sitting Pt Will Perform Upper Body Bathing: with min assist;sitting Pt Will Perform Upper Body Dressing: with min assist;sitting Pt/caregiver will Perform Home Exercise Program: Increased strength;Both right and left upper extremity;With Supervision;With written HEP provided (AROM; increased activity tolerance)   OT Frequency:  Min 1X/week    Co-evaluation PT/OT/SLP Co-Evaluation/Treatment: Yes Reason for Co-Treatment: To address functional/ADL transfers;For patient/therapist safety   OT goals addressed during session: ADL's and self-care      AM-PAC OT 6 Clicks  Daily Activity     Outcome Measure Help from another person eating meals?: A Little Help from another person taking care of personal grooming?: A Little Help from another person toileting, which includes using toliet, bedpan, or urinal?: Total Help from another person bathing (including washing, rinsing, drying)?: A Lot Help from another person to put on and taking off regular upper body clothing?: A Lot Help from another person to put on and taking off regular lower body clothing?: Total 6 Click Score: 12   End of Session Equipment Utilized During Treatment: Right knee immobilizer Nurse Communication: Mobility status;Need for lift equipment;Patient requests pain meds;Weight bearing status  Activity Tolerance: Patient limited by pain;Other (comment) (Pt self-limiting during session) Patient left: in bed;with call bell/phone within reach;with bed alarm set;with family/visitor present  OT Visit Diagnosis: Unsteadiness on feet (R26.81);Other abnormalities of gait and mobility (R26.89);History of falling (Z91.81);Muscle weakness (generalized) (M62.81);Pain                Time: 8860-8842 OT Time Calculation (min): 18 min Charges:  OT General Charges $OT Visit: 1 Visit OT Evaluation $OT Eval Moderate Complexity: 1 Mod  [name] HERO., OTR/L, MA Acute Rehab [phone]   Yolanda FORBES Horns 12/18/2023, 1:16 PM

## 2023-12-18 NOTE — Evaluation (Signed)
 Physical Therapy Evaluation Patient Details Name: [name] MRN: [id number] DOB: [birth year] Today's Date: 12/18/2023  History of Present Illness  [name] is a 89 y.o. female who presented to [facility] ED 8/15 after a fall. X-ray showed minimally displaced distal right femoral fracture and moderate size suprapatellar joint effusion. CT of right knee demonstrated acute displaced fracture of the medial distal femoral  metadiaphysis and medial condyle with extension to the femoral  intercondylar fossa. PMHx: essential HTN, meningioma s/p resection, right breast cancer s/p lumpectomy and radiation, asthma, seizure disorder, and anemia; left femoral fracture s/p ORIF 12/12/2022.   Clinical Impression  Pt admitted with above diagnosis. PTA, pt required assist with all functional mobility, ADLs, and IADLs. Pt would transfer using a hoyer lift into a manual w/c, which pt reports she could propel. She has been residing at a SNF receiving therapy services since August 2024. Pt currently with functional limitations due to the deficits listed below (see PT Problem List). She required max-totalA for bed mobility. Pt is near baseline function, but is now NWB on RLE in a KI. She is limited by pain, decreased activity tolerance, and generalized deconditioning. Pt will benefit from acute skilled PT to maximize her independence and safety with mobility to allow discharge. Recommend return to continued inpatient follow up therapy, <3 hours/day.    If plan is discharge home, recommend the following: Two people to help with walking and/or transfers;Two people to help with bathing/dressing/bathroom;Assistance with cooking/housework;Assistance with feeding;Assist for transportation;Help with stairs or ramp for entrance   Can travel by private vehicle   No    Equipment Recommendations    Recommendations for Other Services       Functional Status Assessment Patient has had a recent decline in their functional status and/or  demonstrates limited ability to make significant improvements in function in a reasonable and predictable amount of time     Precautions / Restrictions Precautions Precautions: Fall Recall of Precautions/Restrictions: Impaired Required Braces or Orthoses: Knee Immobilizer - Right Knee Immobilizer - Right: On at all times Restrictions Weight Bearing Restrictions Per Provider Order: Yes RLE Weight Bearing Per Provider Order: Non weight bearing      Mobility  Bed Mobility Overal bed mobility: Needs Assistance Bed Mobility: Supine to Sit, Sit to Supine     Supine to sit: Max assist, +2 for physical assistance, +2 for safety/equipment, HOB elevated Sit to supine: Max assist, Total assist, +2 for physical assistance, +2 for safety/equipment   General bed mobility comments: Pt requring cues for hand placement/technique and encouragement for participation throughout. Pivoted pt to/from EOB with use of bed pad. Assist to manage trunk and BLE. Repositioned with +2 assist, bed features, and bed pad.    Transfers Overall transfer level: Needs assistance                 General transfer comment: Pt requires a Hoyer-style mechanical lift for transfers    Ambulation/Gait                  Stairs            Wheelchair Mobility     Tilt Bed    Modified Rankin (Stroke Patients Only)       Balance Overall balance assessment: Needs assistance, History of Falls Sitting-balance support: Bilateral upper extremity supported, Feet supported Sitting balance-Leahy Scale: Poor Sitting balance - Comments: Pt requiring Mod to Max assist for balance sitting EOB Postural control: Posterior lean (Pt with heavy posterior  lean; suspect pain is a factor)                                   Pertinent Vitals/Pain Pain Assessment Pain Assessment: Faces Faces Pain Scale: Hurts whole lot Pain Location: R LE Pain Descriptors / Indicators: Discomfort, Grimacing,  Guarding, Moaning, Sore, Aching Pain Intervention(s): Premedicated before session, Monitored during session, Limited activity within patient's tolerance, Patient requesting pain meds-RN notified, RN gave pain meds during session, Relaxation    Home Living Family/patient expects to be discharged to:: Skilled nursing facility                   Additional Comments: Pt has been in SNF since fall with L femoral fx requiring ORIF in August 2024. Pt reports she was still receiving therapy services just prior to this admission.    Prior Function Prior Level of Function : Needs assist       Physical Assist : Mobility (physical);ADLs (physical) Mobility (physical): Bed mobility;Transfers ADLs (physical): Feeding;Grooming;Bathing;Dressing;Toileting;IADLs Mobility Comments: At SNF, pt was using a hoyer lift for transfers and a manual w/c for mobility. She reports she could propel manual w/c. Per chart review, staff attempted to transfer pt without lift resulting in fall and what let to this current admission. Per pt's sister and chart review, pt has had poor participation in therapy services at [facility]. ADLs Comments: At baseline, pt requires Set up for self-feeding and oral care and mod-max assist +1 to +2 assist for all other ADLs. Pt receives assist from family or staff for all IADLs.     Extremity/Trunk Assessment   Upper Extremity Assessment Upper Extremity Assessment: Defer to OT evaluation RUE Deficits / Details: generalized weakness; decreased fine motor coordination; noted mild tremor with purposeful movement with pt reporting this is baseline and varies in intensity from day to day RUE Coordination: decreased fine motor LUE Deficits / Details: generalized weakness; decreased fine motor coordination; noted mild tremor with purposeful movement with pt reporting this is baseline and varies in intensity from day to day LUE Coordination: decreased fine motor    Lower Extremity  Assessment Lower Extremity Assessment: RLE deficits/detail;LLE deficits/detail RLE Deficits / Details: Pt in knee immobilizer. She demonstrated limited ankle DF/PF AROM and PROM. Pt guarded against hip PROM. Grossly 2-/5 strength. RLE: Unable to fully assess due to pain;Unable to fully assess due to immobilization RLE Coordination: decreased gross motor LLE Deficits / Details: PROM limited at all joints. Grossly 2-/5 strength. LLE: Unable to fully assess due to pain LLE Coordination: decreased gross motor    Cervical / Trunk Assessment Cervical / Trunk Assessment: Kyphotic  Communication   Communication Communication: No apparent difficulties    Cognition Arousal: Alert Behavior During Therapy: WFL for tasks assessed/performed, Flat affect   PT - Cognitive impairments: No apparent impairments                         Following commands: Intact       Cueing Cueing Techniques: Verbal cues, Gestural cues, Visual cues, Tactile cues     General Comments General comments (skin integrity, edema, etc.): Pt's sister present and supportive throughout session. RN present for a portion of session.    Exercises     Assessment/Plan    PT Assessment Patient needs continued PT services  PT Problem List Decreased strength;Decreased range of motion;Decreased activity tolerance;Decreased balance;Decreased mobility;Decreased knowledge of  use of DME;Decreased safety awareness;Decreased knowledge of precautions;Pain       PT Treatment Interventions DME instruction;Functional mobility training;Therapeutic activities;Therapeutic exercise;Balance training;Wheelchair mobility training;Patient/family education    PT Goals (Current goals can be found in the Care Plan section)  Acute Rehab PT Goals Patient Stated Goal: Have less pain PT Goal Formulation: With patient/family Time For Goal Achievement: 01/01/24 Potential to Achieve Goals: Fair Additional Goals Additional Goal #1: Pt will  propel manual w/c 50ft with minA    Frequency Min 1X/week     Co-evaluation PT/OT/SLP Co-Evaluation/Treatment: Yes Reason for Co-Treatment: To address functional/ADL transfers;For patient/therapist safety PT goals addressed during session: Mobility/safety with mobility;Balance OT goals addressed during session: ADL's and self-care       AM-PAC PT 6 Clicks Mobility  Outcome Measure Help needed turning from your back to your side while in a flat bed without using bedrails?: Total Help needed moving from lying on your back to sitting on the side of a flat bed without using bedrails?: Total Help needed moving to and from a bed to a chair (including a wheelchair)?: Total Help needed standing up from a chair using your arms (e.g., wheelchair or bedside chair)?: Total Help needed to walk in hospital room?: Total Help needed climbing 3-5 steps with a railing? : Total 6 Click Score: 6    End of Session Equipment Utilized During Treatment: Right knee immobilizer Activity Tolerance: Patient limited by pain Patient left: in bed;with call bell/phone within reach;with bed alarm set;with family/visitor present Nurse Communication: Mobility status;Need for lift equipment;Patient requests pain meds;Other (comment) (PRAFOs for BLE and potential need for an air mattress) PT Visit Diagnosis: Pain;Other abnormalities of gait and mobility (R26.89);Unsteadiness on feet (R26.81) Pain - Right/Left: Right Pain - part of body: Knee;Leg    Time: 8865-8842 PT Time Calculation (min) (ACUTE ONLY): 23 min   Charges:   PT Evaluation $PT Eval Moderate Complexity: 1 Mod   PT General Charges $$ ACUTE PT VISIT: 1 Visit         Randall SAUNDERS, PT, DPT Acute Rehabilitation Services Office: [phone] Secure Chat Preferred  Delon CHRISTELLA Callander 12/18/2023, 1:36 PM

## 2023-12-19 DIAGNOSIS — S72401A Unspecified fracture of lower end of right femur, initial encounter for closed fracture: Secondary | ICD-10-CM | POA: Diagnosis present

## 2023-12-19 DIAGNOSIS — G250 Essential tremor: Secondary | ICD-10-CM | POA: Diagnosis present

## 2023-12-19 DIAGNOSIS — L89626 Pressure-induced deep tissue damage of left heel: Secondary | ICD-10-CM | POA: Diagnosis present

## 2023-12-19 DIAGNOSIS — J4489 Other specified chronic obstructive pulmonary disease: Secondary | ICD-10-CM | POA: Diagnosis present

## 2023-12-19 DIAGNOSIS — M25469 Effusion, unspecified knee: Secondary | ICD-10-CM | POA: Diagnosis present

## 2023-12-19 DIAGNOSIS — R5381 Other malaise: Secondary | ICD-10-CM | POA: Diagnosis not present

## 2023-12-19 DIAGNOSIS — I10 Essential (primary) hypertension: Secondary | ICD-10-CM | POA: Diagnosis present

## 2023-12-19 DIAGNOSIS — E8722 Chronic metabolic acidosis: Secondary | ICD-10-CM | POA: Diagnosis present

## 2023-12-19 DIAGNOSIS — E119 Type 2 diabetes mellitus without complications: Secondary | ICD-10-CM | POA: Diagnosis present

## 2023-12-19 DIAGNOSIS — Z85038 Personal history of other malignant neoplasm of large intestine: Secondary | ICD-10-CM | POA: Diagnosis not present

## 2023-12-19 DIAGNOSIS — E785 Hyperlipidemia, unspecified: Secondary | ICD-10-CM | POA: Diagnosis present

## 2023-12-19 DIAGNOSIS — K5900 Constipation, unspecified: Secondary | ICD-10-CM | POA: Diagnosis not present

## 2023-12-19 DIAGNOSIS — S7290XA Unspecified fracture of unspecified femur, initial encounter for closed fracture: Secondary | ICD-10-CM | POA: Diagnosis present

## 2023-12-19 DIAGNOSIS — E1165 Type 2 diabetes mellitus with hyperglycemia: Secondary | ICD-10-CM | POA: Diagnosis present

## 2023-12-19 DIAGNOSIS — G40909 Epilepsy, unspecified, not intractable, without status epilepticus: Secondary | ICD-10-CM | POA: Diagnosis present

## 2023-12-19 DIAGNOSIS — E871 Hypo-osmolality and hyponatremia: Secondary | ICD-10-CM | POA: Diagnosis present

## 2023-12-19 DIAGNOSIS — Z86000 Personal history of in-situ neoplasm of breast: Secondary | ICD-10-CM | POA: Diagnosis not present

## 2023-12-19 DIAGNOSIS — Y92129 Unspecified place in nursing home as the place of occurrence of the external cause: Secondary | ICD-10-CM | POA: Diagnosis not present

## 2023-12-19 DIAGNOSIS — Z794 Long term (current) use of insulin: Secondary | ICD-10-CM | POA: Diagnosis not present

## 2023-12-19 DIAGNOSIS — W19XXXA Unspecified fall, initial encounter: Secondary | ICD-10-CM | POA: Diagnosis present

## 2023-12-19 DIAGNOSIS — D320 Benign neoplasm of cerebral meninges: Secondary | ICD-10-CM | POA: Diagnosis present

## 2023-12-19 DIAGNOSIS — Z9049 Acquired absence of other specified parts of digestive tract: Secondary | ICD-10-CM | POA: Diagnosis not present

## 2023-12-19 DIAGNOSIS — Z515 Encounter for palliative care: Secondary | ICD-10-CM | POA: Diagnosis not present

## 2023-12-19 DIAGNOSIS — Z7401 Bed confinement status: Secondary | ICD-10-CM | POA: Diagnosis not present

## 2023-12-19 DIAGNOSIS — Z66 Do not resuscitate: Secondary | ICD-10-CM | POA: Diagnosis present

## 2023-12-19 DIAGNOSIS — L89616 Pressure-induced deep tissue damage of right heel: Secondary | ICD-10-CM | POA: Diagnosis present

## 2023-12-19 DIAGNOSIS — E440 Moderate protein-calorie malnutrition: Secondary | ICD-10-CM | POA: Diagnosis present

## 2023-12-19 DIAGNOSIS — Z993 Dependence on wheelchair: Secondary | ICD-10-CM | POA: Diagnosis not present

## 2023-12-19 DIAGNOSIS — Z79891 Long term (current) use of opiate analgesic: Secondary | ICD-10-CM | POA: Diagnosis not present

## 2023-12-19 DIAGNOSIS — E039 Hypothyroidism, unspecified: Secondary | ICD-10-CM | POA: Diagnosis present

## 2023-12-19 LAB — BASIC METABOLIC PANEL WITH GFR
Anion gap: 12 (ref 5–15)
Anion gap: 10 (ref 5–15)
BUN: 6 mg/dL — ABNORMAL LOW (ref 8–23)
BUN: 7 mg/dL — ABNORMAL LOW (ref 8–23)
CO2: 19 mmol/L — ABNORMAL LOW (ref 22–32)
CO2: 21 mmol/L — ABNORMAL LOW (ref 22–32)
Calcium: 8.3 mg/dL — ABNORMAL LOW (ref 8.9–10.3)
Calcium: 7.6 mg/dL — ABNORMAL LOW (ref 8.9–10.3)
Chloride: 91 mmol/L — ABNORMAL LOW (ref 98–111)
Chloride: 90 mmol/L — ABNORMAL LOW (ref 98–111)
Creatinine, Ser: 0.45 mg/dL (ref 0.44–1.00)
Creatinine, Ser: 0.52 mg/dL (ref 0.44–1.00)
GFR, Estimated: >60 mL/min (ref >60)
GFR, Estimated: >60 mL/min (ref >60)
Glucose, Bld: 204 mg/dL — ABNORMAL HIGH (ref 70–99)
Glucose, Bld: 166 mg/dL — ABNORMAL HIGH (ref 70–99)
Potassium: 4.3 mmol/L (ref 3.5–5.1)
Potassium: 4.2 mmol/L (ref 3.5–5.1)
Sodium: 122 mmol/L — ABNORMAL LOW (ref 135–145)
Sodium: 121 mmol/L — ABNORMAL LOW (ref 135–145)

## 2023-12-19 LAB — CBC
HCT: 33.1 % — ABNORMAL LOW (ref 36.0–46.0)
Hemoglobin: 10.9 g/dL — ABNORMAL LOW (ref 12.0–15.0)
MCH: 29.9 pg (ref 26.0–34.0)
MCHC: 32.9 g/dL (ref 30.0–36.0)
MCV: 90.9 fL (ref 80.0–100.0)
Platelets: 333 K/uL (ref 150–400)
RBC: 3.64 MIL/uL — ABNORMAL LOW (ref 3.87–5.11)
RDW: 15.5 % (ref 11.5–15.5)
WBC: 10.1 K/uL (ref 4.0–10.5)
nRBC: 0 % (ref 0.0–0.2)

## 2023-12-19 LAB — GLUCOSE, CAPILLARY
Glucose-Capillary: 164 mg/dL — ABNORMAL HIGH (ref 70–99)
Glucose-Capillary: 178 mg/dL — ABNORMAL HIGH (ref 70–99)
Glucose-Capillary: 138 mg/dL — ABNORMAL HIGH (ref 70–99)
Glucose-Capillary: 114 mg/dL — ABNORMAL HIGH (ref 70–99)

## 2023-12-19 LAB — SODIUM, URINE, RANDOM: Sodium, Ur: 40 mmol/L

## 2023-12-19 LAB — OSMOLALITY, URINE: Osmolality, Ur: 419 mosm/kg (ref 300–900)

## 2023-12-19 LAB — OSMOLALITY: Osmolality: 256 mosm/kg — ABNORMAL LOW (ref 275–295)

## 2023-12-19 MED ORDER — ENSURE PLUS HIGH PROTEIN PO LIQD
237.0000 mL | Freq: Two times a day (BID) | ORAL | Status: DC
  Administered 2023-12-20 – 2023-12-24 (×8): 237 mL via ORAL

## 2023-12-19 MED ORDER — POLYETHYLENE GLYCOL 3350 17 G PO PACK
17.0000 g | PACK | Freq: Two times a day (BID) | ORAL | Status: DC
  Administered 2023-12-20: 17 g via ORAL
  Filled 2023-12-19: qty 1

## 2023-12-19 MED ORDER — SENNOSIDES-DOCUSATE SODIUM 8.6-50 MG PO TABS
1.0000 | ORAL_TABLET | Freq: Two times a day (BID) | ORAL | Status: DC
  Administered 2023-12-19 – 2023-12-20 (×2): 1 via ORAL
  Filled 2023-12-19 (×2): qty 1

## 2023-12-19 MED ORDER — SODIUM CHLORIDE 0.9 % IV BOLUS
500.0000 mL | Freq: Once | INTRAVENOUS | Status: AC
  Administered 2023-12-19: 500 mL via INTRAVENOUS

## 2023-12-19 NOTE — TOC Initial Note (Addendum)
 Transition of Care [facility]) - Initial/Assessment Note    Patient Details  Name: [name] MRN: [id number] Date of Birth: 18-Feb-1930  [facility] CM/SW Contact:    Isaiah Public, LCSWA Phone Number: 12/19/2023, 10:07 AM  Clinical Narrative:                  CSW received consult for possible SNF placement at time of discharge. CSW spoke with patient regarding PT recommendation of SNF placement at time of discharge. Patient reports PTA she comes from [facility].  Patient expressed understanding of PT recommendation and is agreeable to SNF placement at time of discharge. Patient informed CSW she would like to return to [facility] and receive short term rehab when she returns. CSW discussed insurance authorization process. Patient gave CSW permission  to speak with her nephew Zell or her sister Lindajo. CSW LVM for patients nephew Zell. No further questions reported at this time. CSW to continue to follow and assist with discharge planning needs.   Expected Discharge Plan: Skilled Nursing Facility Barriers to Discharge: Continued Medical Work up   Patient Goals and CMS Choice Patient states their goals for this hospitalization and ongoing recovery are:: SNF   Choice offered to / list presented to : Patient      Expected Discharge Plan and Services In-house Referral: Clinical Social Work     Living arrangements for the past 2 months:  (from [facility] short term)                                      Prior Living Arrangements/Services Living arrangements for the past 2 months:  (from [facility] short term) Lives with:: Self Patient language and need for interpreter reviewed:: Yes Do you feel safe going back to the place where you live?: No   SNF  Need for Family Participation in Patient Care: Yes (Comment) Care giver support system in place?: Yes (comment)   Criminal Activity/Legal Involvement Pertinent to Current Situation/Hospitalization: No - Comment  as needed  Activities of Daily Living      Permission Sought/Granted Permission sought to share information with : Case Manager, Magazine features editor, Family Supports Permission granted to share information with : Yes, Verbal Permission Granted  Share Information with NAME: Zell Lindajo  Permission granted to share info w AGENCY: SNF  Permission granted to share info w Relationship: nephew and sister  Permission granted to share info w Contact Information: Zell Dustman [phone], Lindajo Sharps (sister) [phone]  Emotional Assessment Appearance:: Appears stated age Attitude/Demeanor/Rapport: Gracious Affect (typically observed): Calm Orientation: : Oriented to Self, Oriented to Place, Oriented to  Time, Oriented to Situation Alcohol  / Substance Use: Not Applicable Psych Involvement: No (comment)  Admission diagnosis:  Femur fracture (HCC) [S72.90XA] Hyponatremia [E87.1] Closed fracture of distal end of right femur, unspecified fracture morphology, initial encounter (HCC) [S72.401A] Patient Active Problem List   Diagnosis Date Noted   Femur fracture (HCC) 12/17/2023   Physical deconditioning 01/01/2023   Pressure injury of skin 01/01/2023   Pressure injury of sacral region, stage 2 (HCC) 01/01/2023   Hypomagnesemia 01/01/2023   Pyelonephritis 12/26/2022   Pancreatic mass 12/26/2022   Hernia of abdominal cavity 12/26/2022   Closed displaced fracture of left femoral neck (HCC) 12/26/2022   Closed left hip fracture, initial encounter (HCC) 12/09/2022   Generalized weakness 10/28/2022   Left hand pain 10/28/2022  SBO (small bowel obstruction) (HCC) 01/24/2022   Osteopenia 10/06/2021   Family history of malignant neoplasm of digestive organs 10/06/2021   History of malignant neoplasm of colon 10/06/2021   Hx of compression fracture of spine 10/06/2021   Intestinal malabsorption 10/06/2021   Pharyngeal dysphagia 10/06/2021   Carpal tunnel syndrome of left wrist  09/22/2021   CAP (community acquired pneumonia) 04/03/2021   Numbness of hand 01/13/2021   DDD (degenerative disc disease), cervical 12/03/2020   Compression fracture of body of thoracic vertebra (HCC) 08/21/2020   Dilantin  toxicity 08/17/2020   Fracture of neck of femur (HCC) 07/11/2020   Postoperative anemia due to acute blood loss 06/06/2020   Leukocytosis    Closed right hip fracture (HCC) 06/04/2020   Constipation 05/27/2020   Dyslipidemia 05/27/2020   Enterocolitis due to Clostridium difficile, not specified as recurrent 05/27/2020   Pleural effusion on right 04/01/2020   Acute respiratory failure with hypoxia (HCC) 04/01/2020   Pneumonia of right lung due to infectious organism 04/01/2020   Uncontrolled type 2 diabetes mellitus with hyperglycemia, with long-term current use of insulin  (HCC) 04/01/2020   Abnormal CT of the chest 04/01/2020   Subacute pulmonary embolism (HCC) 04/01/2020   Acute metabolic encephalopathy 04/01/2020   Adenocarcinoma of colon (HCC) 04/01/2020   Nausea & vomiting 03/12/2020   Cancer of ascending colon pT3pN0 (0/12 LN) s/p lap right colectomy 03/06/2020 03/09/2020   History of multiple allergies 03/09/2020   S/P right hemicolectomy 03/06/2020   Closed fracture of shaft of tibia 02/12/2020   Abnormal feces 12/11/2019   Preop cardiovascular exam 12/11/2019   Gait abnormality 12/04/2019   Impaired left ventricular function 09/05/2019   Pain in joint of left shoulder 02/27/2019   Hypokalemia    Effusion of left knee joint    Hyponatremia    Left patella fracture 10/26/2018   Fracture of patella 10/26/2018   Pain in left knee 09/29/2018   Acquired hypothyroidism 08/25/2017   H/O excision of tumor of brain meninges 08/25/2017   Hammer toe 08/25/2017   History of breast cancer 08/25/2017   Nontoxic thyroid  nodule 08/25/2017   Osteopetrosis 08/25/2017   Age-related osteoporosis without current pathological fracture 08/25/2017   Dysuria 01/19/2017    Vitamin D  deficiency 11/18/2016   Tremor, essential 08/18/2016   Closed burst fracture of lumbar vertebra (HCC) 11/25/2015   Seizure disorder (HCC) 10/16/2014   Chronic low back pain 12/21/2013   Cerebral meningioma (HCC) 12/21/2013   Brain tumor (benign) (HCC) 10/18/2013   Subdural hemorrhage (HCC) 10/09/2013   Anxiety 10/09/2013   Compression fracture of T12 vertebra (HCC) 10/09/2013   Nontoxic multinodular goiter 09/05/2013   Syncope 08/27/2013   Carotid artery disease (HCC) 08/27/2013   Abnormal thyroid  ultrasound 08/27/2013   Cancer of central portion of female breast (HCC) 12/30/2012   Osteoporosis 03/06/2010   ASTHMA 03/05/2009   Asthma 03/05/2009   IRRITABLE BOWEL SYNDROME  diarrhea type 03/21/2008   ANEMIA-NOS 12/16/2006   GERD 12/16/2006   Insulin -requiring or dependent type II diabetes mellitus (HCC) 10/29/2006   Mixed hyperlipidemia 10/29/2006   Essential hypertension 10/29/2006   Allergic rhinitis, cause unspecified 10/29/2006   OSTEOARTHRITIS 10/29/2006   PCP:  Jolee Madelin Patch, MD Pharmacy:  No Pharmacies Listed    Social Drivers of Health (SDOH) Social History: SDOH Screenings   Food Insecurity: No Food Insecurity (12/18/2023)  Housing: Low Risk  (12/18/2023)  Transportation Needs: No Transportation Needs (12/18/2023)  Utilities: Not At Risk (12/18/2023)  Depression (PHQ2-9): Low Risk  (09/18/2020)  Social Connections: Moderately Isolated (12/18/2023)  Tobacco Use: Low Risk  (12/17/2023)   SDOH Interventions:     Readmission Risk Interventions    12/28/2022    3:43 PM 12/15/2022   11:12 AM 10/27/2022   12:30 PM  Readmission Risk Prevention Plan  Transportation Screening Complete Complete Complete  PCP or Specialist Appt within 3-5 Days Complete  Complete  HRI or Home Care Consult Complete  Complete  Social Work Consult for Recovery Care Planning/Counseling Complete  Complete  Palliative Care Screening Not Applicable    Medication Review Furniture conservator/restorer) Complete Complete Complete  PCP or Specialist appointment within 3-5 days of discharge  Complete   HRI or Home Care Consult  Complete   SW Recovery Care/Counseling Consult  Complete   Palliative Care Screening  Not Applicable   Skilled Nursing Facility  Complete

## 2023-12-19 NOTE — [place] FL2 (Signed)
 [place]  MEDICAID FL2 LEVEL OF CARE FORM     IDENTIFICATION  Patient Name: [name] Birthdate: 23-Nov-1929 Sex: female Admission Date (Current Location): 12/17/2023  [facility] and [place] Number:  Producer, television/film/video and Address:  [facility], [address] [place], [place] [address]      Provider Number: [id number]  Attending Physician Name and Address:  Trudy Mliss Dragon, MD  Relative Name and Phone Number:  Zell Dustman (nephew) [phone], Lindajo Sharps (sister) [phone]    Current Level of Care: Hospital Recommended Level of Care: Skilled Nursing Facility Prior Approval Number:    Date Approved/Denied:   PASRR Number: [id number]  Discharge Plan: SNF    Current Diagnoses: Patient Active Problem List   Diagnosis Date Noted   Femur fracture (HCC) 12/17/2023   Physical deconditioning 01/01/2023   Pressure injury of skin 01/01/2023   Pressure injury of sacral region, stage 2 (HCC) 01/01/2023   Hypomagnesemia 01/01/2023   Pyelonephritis 12/26/2022   Pancreatic mass 12/26/2022   Hernia of abdominal cavity 12/26/2022   Closed displaced fracture of left femoral neck (HCC) 12/26/2022   Closed left hip fracture, initial encounter (HCC) 12/09/2022   Generalized weakness 10/28/2022   Left hand pain 10/28/2022   SBO (small bowel obstruction) (HCC) 01/24/2022   Osteopenia 10/06/2021   Family history of malignant neoplasm of digestive organs 10/06/2021   History of malignant neoplasm of colon 10/06/2021   Hx of compression fracture of spine 10/06/2021   Intestinal malabsorption 10/06/2021   Pharyngeal dysphagia 10/06/2021   Carpal tunnel syndrome of left wrist 09/22/2021   CAP (community acquired pneumonia) 04/03/2021   Numbness of hand 01/13/2021   DDD (degenerative disc disease), cervical 12/03/2020   Compression fracture of body of thoracic vertebra (HCC) 08/21/2020   Dilantin  toxicity 08/17/2020   Fracture of neck of femur (HCC)  07/11/2020   Postoperative anemia due to acute blood loss 06/06/2020   Leukocytosis    Closed right hip fracture (HCC) 06/04/2020   Constipation 05/27/2020   Dyslipidemia 05/27/2020   Enterocolitis due to Clostridium difficile, not specified as recurrent 05/27/2020   Pleural effusion on right 04/01/2020   Acute respiratory failure with hypoxia (HCC) 04/01/2020   Pneumonia of right lung due to infectious organism 04/01/2020   Uncontrolled type 2 diabetes mellitus with hyperglycemia, with long-term current use of insulin  (HCC) 04/01/2020   Abnormal CT of the chest 04/01/2020   Subacute pulmonary embolism (HCC) 04/01/2020   Acute metabolic encephalopathy 04/01/2020   Adenocarcinoma of colon (HCC) 04/01/2020   Nausea & vomiting 03/12/2020   Cancer of ascending colon pT3pN0 (0/12 LN) s/p lap right colectomy 03/06/2020 03/09/2020   History of multiple allergies 03/09/2020   S/P right hemicolectomy 03/06/2020   Closed fracture of shaft of tibia 02/12/2020   Abnormal feces 12/11/2019   Preop cardiovascular exam 12/11/2019   Gait abnormality 12/04/2019   Impaired left ventricular function 09/05/2019   Pain in joint of left shoulder 02/27/2019   Hypokalemia    Effusion of left knee joint    Hyponatremia    Left patella fracture 10/26/2018   Fracture of patella 10/26/2018   Pain in left knee 09/29/2018   Acquired hypothyroidism 08/25/2017   H/O excision of tumor of brain meninges 08/25/2017   Hammer toe 08/25/2017   History of breast cancer 08/25/2017   Nontoxic thyroid  nodule 08/25/2017   Osteopetrosis 08/25/2017   Age-related osteoporosis without current pathological fracture 08/25/2017   Dysuria 01/19/2017   Vitamin D   deficiency 11/18/2016   Tremor, essential 08/18/2016   Closed burst fracture of lumbar vertebra (HCC) 11/25/2015   Seizure disorder (HCC) 10/16/2014   Chronic low back pain 12/21/2013   Cerebral meningioma (HCC) 12/21/2013   Brain tumor (benign) (HCC) 10/18/2013    Subdural hemorrhage (HCC) 10/09/2013   Anxiety 10/09/2013   Compression fracture of T12 vertebra (HCC) 10/09/2013   Nontoxic multinodular goiter 09/05/2013   Syncope 08/27/2013   Carotid artery disease (HCC) 08/27/2013   Abnormal thyroid  ultrasound 08/27/2013   Cancer of central portion of female breast (HCC) 12/30/2012   Osteoporosis 03/06/2010   ASTHMA 03/05/2009   Asthma 03/05/2009   IRRITABLE BOWEL SYNDROME  diarrhea type 03/21/2008   ANEMIA-NOS 12/16/2006   GERD 12/16/2006   Insulin -requiring or dependent type II diabetes mellitus (HCC) 10/29/2006   Mixed hyperlipidemia 10/29/2006   Essential hypertension 10/29/2006   Allergic rhinitis, cause unspecified 10/29/2006   OSTEOARTHRITIS 10/29/2006    Orientation RESPIRATION BLADDER Height & Weight     Self, Time, Situation, Place  Normal Incontinent Weight: 167 lb 8.8 oz (76 kg) Height:  5' 8 (172.7 cm)  BEHAVIORAL SYMPTOMS/MOOD NEUROLOGICAL BOWEL NUTRITION STATUS      Continent Diet (Please see discharge summary)  AMBULATORY STATUS COMMUNICATION OF NEEDS Skin   Extensive Assist Verbally Other (Comment) (Ecchymosis,Buttocks,Bil.,Erythema,Heel,Knee,Buttocks,R,L,Wound/Incision LDAs)                       Personal Care Assistance Level of Assistance  Feeding, Dressing, Bathing Bathing Assistance: Maximum assistance Feeding assistance: Limited assistance Dressing Assistance: Maximum assistance     Functional Limitations Info  Sight, Hearing, Speech Sight Info: Impaired Hearing Info: Impaired Speech Info: Adequate    SPECIAL CARE FACTORS FREQUENCY  PT (By licensed PT), OT (By licensed OT)     PT Frequency: 5x min weekly OT Frequency: 5x min weekly            Contractures Contractures Info: Not present    Additional Factors Info  Code Status, Allergies, Psychotropic, Insulin  Sliding Scale Code Status Info: DNR Allergies Info: Bystolic (Nebivolol Hcl), Carbamazepine, Cholestyramine , Morphine , Niacin,  Norvasc (Amlodipine Besylate), Optivar (Azelastine Hcl), Repaglinide , Sular (Nisoldipine Er), Telmisartan, Amoxicillin-pot Clavulanate, Cefdinir, Ciprofloxacin  Hcl, Clonidine, Codeine, Ezetimibe, Hydroxychloroquine, Naproxen, Sulfa  Antibiotics, Ziac (Bisoprolol-hydrochlorothiazide), Amlodipine Besy-benazepril Hcl, Azelastine, Elavil (Amitriptyline), Empagliflozin, Hydralazine , Iodine  I 131 Tositumomab, Kenalog  (Triamcinolone ), Keppra  (Levetiracetam ), Lamotrigine , Pregabalin , Pseudoephedrine, Pseudoephedrine-guaifenesin  Er, Risedronate, Ru-hist D (Brompheniramine-phenylephrine ), Sumatriptan, Topiramate , Ace Inhibitors, Aspirin , Atacand (Candesartan), Bextra (Valdecoxib), Bisoprolol-hydrochlorothiazide, Cefadroxil, Celecoxib, Hydrocodone , Hydrocodone -acetaminophen , Iodinated Contrast Media, Meloxicam, Methylprednisolone  Sodium Succinate , Nabumetone, Penicillins, Pseudoephedrine-guaifenesin , Rofecoxib, Ru-tuss (Chlorphen-pse-atrop-hyos-scop), Sulfonamide Derivatives, Sulphur (Elemental Sulfur), Telithromycin, Terfenadine, Trandolapril -verapamil  Hcl Er, Valium (Diazepam) Psychotropic Info: busPIRone  (BUSPAR ) tablet 5 mg 3 times daily Insulin  Sliding Scale Info: insulin  aspart (novoLOG ) injection 0-9 Units 3 times daily with meals,insulin  glargine-yfgn (SEMGLEE ) injection 5 Units daily       Current Medications (12/19/2023):  This is the current hospital active medication list Current Facility-Administered Medications  Medication Dose Route Frequency Provider Last Rate Last Admin   acetaminophen  (TYLENOL ) tablet 1,000 mg  1,000 mg Oral Q8H Jolaine Pac, DO   1,000 mg at 12/19/23 0554   apixaban  (ELIQUIS ) tablet 2.5 mg  2.5 mg Oral BID Jolaine Pac, DO   2.5 mg at 12/19/23 9087   busPIRone  (BUSPAR ) tablet 5 mg  5 mg Oral TID Jolaine Pac, DO   5 mg at 12/19/23 9087   cholestyramine  light (PREVALITE ) packet 4 g  4 g Oral BID Jolaine Pac, DO   4 g  at 12/19/23 0837   cycloSPORINE  (RESTASIS ) 0.05 %  ophthalmic emulsion 1 drop  1 drop Both Eyes BID Jolaine Pac, DO   1 drop at 12/19/23 9087   HYDROmorphone  (DILAUDID ) injection 0.25 mg  0.25 mg Intravenous Q2H PRN Jolaine Pac, DO   0.25 mg at 12/19/23 9162   insulin  aspart (novoLOG ) injection 0-9 Units  0-9 Units Subcutaneous TID WC Jolaine Pac, DO   2 Units at 12/19/23 9245   insulin  glargine-yfgn (SEMGLEE ) injection 5 Units  5 Units Subcutaneous Daily Jolaine Pac, DO   5 Units at 12/19/23 0912   levothyroxine  (SYNTHROID ) tablet 75 mcg  75 mcg Oral QAC breakfast Jolaine Pac, DO   75 mcg at 12/19/23 0554   melatonin tablet 3 mg  3 mg Oral QHS Jolaine Pac, DO   3 mg at 12/18/23 2205   oxyCODONE  (Oxy IR/ROXICODONE ) immediate release tablet 2.5 mg  2.5 mg Oral Q3H PRN Jolaine Pac, DO   2.5 mg at 12/18/23 1422   pantoprazole  (PROTONIX ) EC tablet 20 mg  20 mg Oral QODAY Goodwin, Joseph, DO   20 mg at 12/19/23 9087   phenytoin  (DILANTIN ) ER capsule 300 mg  300 mg Oral QHS Jolaine Pac, DO   300 mg at 12/18/23 2217   polyethylene glycol (MIRALAX  / GLYCOLAX ) packet 17 g  17 g Oral BID Juberg, Christopher, DO       primidone  (MYSOLINE ) tablet 150 mg  150 mg Oral Daily Jolaine Pac, DO   150 mg at 12/18/23 1609   primidone  (MYSOLINE ) tablet 250 mg  250 mg Oral Daily Jolaine Pac, DO   250 mg at 12/19/23 0913   senna-docusate (Senokot-S) tablet 1 tablet  1 tablet Oral BID Juberg, Christopher, DO       traZODone  (DESYREL ) tablet 25 mg  25 mg Oral QHS PRN Jolaine Pac, DO   25 mg at 12/18/23 2205   verapamil  (CALAN -SR) CR tablet 180 mg  180 mg Oral Daily Jolaine Pac, DO   180 mg at 12/19/23 9087     Discharge Medications: Please see discharge summary for a list of discharge medications.  Relevant Imaging Results:  Relevant Lab Results:   Additional Information [id number]  Isaiah Public, LCSWA

## 2023-12-19 NOTE — Progress Notes (Signed)
 HD#0 SUBJECTIVE:  Patient Summary: [name] is a 89 y.o. with a pertinent PMH of left femoral neck fracture s/p ORIF, essential HTN, meningioma s/p resection, right breast cancer s/p lumpectomy and radiation, asthma, seizure disorder, and anemia who presented to the ED after a fall and admitted right femoral fracture.   Overnight Events and Interim History: NEO. She reports feeling well this morning. Somewhat groggy at first but woke up and became cheerful. Notes some tingling in her LE but she can localize to the R foot. Does not feel her brace is too tight. Pain in her knee comes and goes. Has not had a bowel movement in a few days and feels some abdominal pressure.  OBJECTIVE:  Vital Signs: Vitals:   12/18/23 2040 12/19/23 0434 12/19/23 0559 12/19/23 0922  BP: (!) 167/62  (!) 175/65 (!) 158/92  Pulse: 60  80 90  Resp: 17  19 17   Temp: 97.7 F (36.5 C)  97.7 F (36.5 C) 97.7 F (36.5 C)  TempSrc:   Oral Axillary  SpO2: 98%  96% 98%  Weight:  76 kg    Height:       Supplemental O2: Room Air SpO2: 98 % O2 Flow Rate (L/min): 0 L/min FiO2 (%): 21 %  Filed Weights   12/17/23 1023 12/18/23 0500 12/19/23 0434  Weight: 68 kg 76.2 kg 76 kg     Intake/Output Summary (Last 24 hours) at 12/19/2023 0951 Last data filed at 12/19/2023 9077 Gross per 24 hour  Intake 720 ml  Output 1150 ml  Net -430 ml   Net IO Since Admission: -190 mL [12/19/23 0951]  Physical Exam: Physical Exam Constitutional:      General: She is not in acute distress.    Appearance: She is not ill-appearing or toxic-appearing.  Cardiovascular:     Rate and Rhythm: Normal rate and regular rhythm.     Pulses: Normal pulses.     Heart sounds: Normal heart sounds.  Pulmonary:     Effort: Pulmonary effort is normal. No respiratory distress.     Breath sounds: Normal breath sounds.  Abdominal:    General: Stomach with increase distension vs yesterday. Bowel sounds present. Very mild tenderness diffusely. A  chronic umbilical hernia is noted. Musculoskeletal:     Comments: R leg in brace. Loose enough for fingers to insert between skin and brace. Some edema in R foot. She can localize sensation to that foot. Neurological:     Mental Status: Mental status is at baseline.  Psychiatric:        Mood and Affect: Mood normal.        Behavior: Behavior normal.   Patient Lines/Drains/Airways Status     Active Line/Drains/Airways     Name Placement date Placement time Site Days   Peripheral IV 12/17/23 20 G Left Antecubital 12/17/23  1027  Antecubital  2   Wound / Incision (Open or Dehisced) 12/26/22 Heel Right unstageable 12/26/22  1800  Heel  358            ASSESSMENT/PLAN:  Assessment: Principal Problem:   Femur fracture (HCC)  [name] is a 89 y.o. with a pertinent PMH of left femoral neck fracture s/p ORIF, essential HTN, meningioma s/p resection, right breast cancer s/p lumpectomy and radiation, asthma, seizure disorder, and anemia who presented to the ED after a fall and admitted right femoral fracture.   Plan: Right Femoral Fracture Per orthopedics, not operative. They have placed a leg brace and  will follow up in the clinic. This is a patient who is non-ambulatory and uses hoyer lift for transfers to chair. Pain is manageable today. Distal neurovascular intact. The R foot looks somewhat swollen. - Scheduled tylenol  1000 mg every 8 hours - Hydromorphone  0.25 mg every 2 hours as needed for breakthrough pain - Oxycodone  2.5 mg every 3 hours as needed for severe pain - Miralax  and senokot BID for bowel regimen  Constipation No bowel movements in several days. Worsened with narcotics for pain control, albeit low dose. Abdomen is tighter than yesterday, normal bowel sounds. Will increase her bowel regimen. - Miralax  and senokot BID - Minimize narcotic pain meds as able   Acute on Chronic Hyponatremia  122 this am, lowest since admission. Suspect baseline upper 120s. Does not appear  symptomatic. Suspect this is nutrition related. Could consider SIADH but she is not taking in much fluid, effectively self-imposed fluid restriction. Will add nutrition shakes and collect urine labs for workup.  Hypokalemia, resolved    Normocytic Anemia  Improving. No signs of bleeding. Her hemoglobin levels have been the high 9s for the past year. She is on Eliquis  2.5 mg it seems since her last lower extremity fracture for possible DVT prophylaxis.  No signs of hemarthrosis on CT scan - Monitor with CBC - Continue Eliquis  2.5 mg daily - Transfuse if Hgb < 7   Well-controlled Type 2 Diabetes Mellitus  Hemoglobin A1c a year ago 6.9. Previously on Lantus  10 units and Novolog  2 units. Sugar goal 140-180 - Sensitive sliding scale with glargine 5 units and aspart 0-9 units    Chronic Issues Essential Tremor Her tremor has affected her ability to eat and feed herself, leading to having to rely on others for feeding and ambulation.  -  Continue primidone  250 mg every morning and 150 mg every afternoon  Hypothyroidism TSH 2.9 a year ago and she has been stable on Levothyroxine  74 mcg. - Continue Levothyroxine  75 mcg daily  Anxiety  - Continue buspirone  5 mg 3 time daily   Seizure Disorder  - Continue phenytoin  300 mg nightly  Chronic Deconditioning and Immobility Patient is immobile and wheelchair-bound at baseline and right now has protective dressings on her heels.  - continue protective heel dressing    Best Practice: Diet: Regular diet IVF: Fluids:  VTE: apixaban  (ELIQUIS ) tablet 2.5 mg Start: 12/17/23 2200 Code: DNR  Signature: Lonni Africa, D.O.  Internal Medicine Resident, PGY-1 [facility]  Pager: # [phone]. 9:51 AM, 12/19/2023

## 2023-12-19 NOTE — Plan of Care (Signed)

## 2023-12-19 NOTE — Consult Note (Signed)
 WOC Nurse Consult Note: Reason for Consult:Deep tissue injury to bilateral heels. Present on admission. Patient is immobile and uses hoyer lift for transfers.  Wound type: Deep tissue injury  Pressure Injury POA: Yes Measurement: bedside RN to obtain and place on flowsheet.  Intact skin  Wound bed: intact Drainage (amount, consistency, odor) none Periwound: edema to right foot  Right femoral fracture with leg brace in place.   Dressing procedure/placement/frequency: Heel foam to bilateral heels and Prevalon boots if able to apply around brace,.  IF not, float heels with a pillow under the calf.   Will not follow at this time.  Please re-consult if needed.  Darice Cooley MSN, RN, FNP-BC CWON Wound, Ostomy, Continence Nurse Outpatient [facility] [phone] Pager [phone]

## 2023-12-20 DIAGNOSIS — Z794 Long term (current) use of insulin: Secondary | ICD-10-CM

## 2023-12-20 DIAGNOSIS — K5900 Constipation, unspecified: Secondary | ICD-10-CM

## 2023-12-20 LAB — CBC
HCT: 31 % — ABNORMAL LOW (ref 36.0–46.0)
Hemoglobin: 10.5 g/dL — ABNORMAL LOW (ref 12.0–15.0)
MCH: 29.6 pg (ref 26.0–34.0)
MCHC: 33.9 g/dL (ref 30.0–36.0)
MCV: 87.3 fL (ref 80.0–100.0)
Platelets: 445 K/uL — ABNORMAL HIGH (ref 150–400)
RBC: 3.55 MIL/uL — ABNORMAL LOW (ref 3.87–5.11)
RDW: 15.3 % (ref 11.5–15.5)
WBC: 8.2 K/uL (ref 4.0–10.5)
nRBC: 0 % (ref 0.0–0.2)

## 2023-12-20 LAB — BASIC METABOLIC PANEL WITH GFR
Anion gap: 12 (ref 5–15)
Anion gap: 10 (ref 5–15)
BUN: 6 mg/dL — ABNORMAL LOW (ref 8–23)
BUN: 6 mg/dL — ABNORMAL LOW (ref 8–23)
CO2: 21 mmol/L — ABNORMAL LOW (ref 22–32)
CO2: 22 mmol/L (ref 22–32)
Calcium: 8.5 mg/dL — ABNORMAL LOW (ref 8.9–10.3)
Calcium: 8.5 mg/dL — ABNORMAL LOW (ref 8.9–10.3)
Chloride: 90 mmol/L — ABNORMAL LOW (ref 98–111)
Chloride: 92 mmol/L — ABNORMAL LOW (ref 98–111)
Creatinine, Ser: 0.39 mg/dL — ABNORMAL LOW (ref 0.44–1.00)
Creatinine, Ser: 0.41 mg/dL — ABNORMAL LOW (ref 0.44–1.00)
GFR, Estimated: >60 mL/min (ref >60)
GFR, Estimated: >60 mL/min (ref >60)
Glucose, Bld: 175 mg/dL — ABNORMAL HIGH (ref 70–99)
Glucose, Bld: 188 mg/dL — ABNORMAL HIGH (ref 70–99)
Potassium: 4.2 mmol/L (ref 3.5–5.1)
Potassium: 4.2 mmol/L (ref 3.5–5.1)
Sodium: 123 mmol/L — ABNORMAL LOW (ref 135–145)
Sodium: 124 mmol/L — ABNORMAL LOW (ref 135–145)

## 2023-12-20 LAB — GLUCOSE, CAPILLARY
Glucose-Capillary: 163 mg/dL — ABNORMAL HIGH (ref 70–99)
Glucose-Capillary: 178 mg/dL — ABNORMAL HIGH (ref 70–99)
Glucose-Capillary: 114 mg/dL — ABNORMAL HIGH (ref 70–99)

## 2023-12-20 MED ORDER — SODIUM CHLORIDE 1 G PO TABS
1.0000 g | ORAL_TABLET | Freq: Once | ORAL | Status: AC
  Administered 2023-12-20: 1 g via ORAL
  Filled 2023-12-20: qty 1

## 2023-12-20 MED ORDER — POLYETHYLENE GLYCOL 3350 17 G PO PACK
17.0000 g | PACK | Freq: Every day | ORAL | Status: DC
  Administered 2023-12-21 – 2023-12-24 (×3): 17 g via ORAL
  Filled 2023-12-20 (×3): qty 1

## 2023-12-20 MED ORDER — SENNOSIDES-DOCUSATE SODIUM 8.6-50 MG PO TABS
1.0000 | ORAL_TABLET | Freq: Every day | ORAL | Status: DC
  Administered 2023-12-21: 1 via ORAL
  Filled 2023-12-20: qty 1

## 2023-12-20 MED ORDER — SODIUM CHLORIDE 1 G PO TABS
1.0000 g | ORAL_TABLET | Freq: Three times a day (TID) | ORAL | Status: DC
  Administered 2023-12-20 – 2023-12-24 (×15): 1 g via ORAL
  Filled 2023-12-20 (×15): qty 1

## 2023-12-20 NOTE — Progress Notes (Cosign Needed Addendum)
 HD#1 SUBJECTIVE:  Patient Summary: [name] is a 89 y.o. with a pertinent PMH of f left femoral neck fracture s/p ORIF, essential HTN, meningioma s/p resection, right breast cancer s/p lumpectomy and radiation, asthma, seizure disorder, and anemia who presented to the ED after a fall and admitted right femoral fracture.   Overnight Events: None   Interim History: Patient reports recently getting pain medicine. Denies lightheadedness, dizziness, or SOB. Has not had breakfast this morning. It was unclear if pt had BM today, but had one documented yesterday.  She reports feeling lousy today.  OBJECTIVE:  Vital Signs: Vitals:   12/19/23 1959 12/20/23 0303 12/20/23 0410 12/20/23 0834  BP: (!) 157/72  (!) 157/79 (!) 146/77  Pulse: 79  79 84  Resp: 16  17 17   Temp: 98.2 F (36.8 C)  97.9 F (36.6 C) 97.6 F (36.4 C)  TempSrc:      SpO2: 99%  98% 96%  Weight:  76 kg    Height:       Supplemental O2: Room Air SpO2: 96 % O2 Flow Rate (L/min): 0 L/min FiO2 (%): 21 %  Filed Weights   12/18/23 0500 12/19/23 0434 12/20/23 0303  Weight: 76.2 kg 76 kg 76 kg     Intake/Output Summary (Last 24 hours) at 12/20/2023 1038 Last data filed at 12/20/2023 0300 Gross per 24 hour  Intake 360 ml  Output 1500 ml  Net -1140 ml   Net IO Since Admission: -1,330 mL [12/20/23 1038]  Physical Exam: Physical Exam Constitutional:      General: She is not in acute distress.    Appearance: She is not ill-appearing.  Cardiovascular:     Rate and Rhythm: Normal rate and regular rhythm.     Pulses: Normal pulses.     Heart sounds: Normal heart sounds.  Pulmonary:     Effort: Pulmonary effort is normal. No respiratory distress.     Breath sounds: Normal breath sounds.  Abdominal:     General: There is distension.     Hernia: A hernia is present.     Comments: Abdomen still distended. Umbilical hernia noted.   Musculoskeletal:     Comments: Right immobilizer and bilateral Pevalon boots in  place.   Skin:    General: Skin is warm and dry.  Psychiatric:        Mood and Affect: Mood normal.        Behavior: Behavior normal.     Patient Lines/Drains/Airways Status     Active Line/Drains/Airways     Name Placement date Placement time Site Days   Peripheral IV 12/17/23 20 G Left Antecubital 12/17/23  1027  Antecubital  3   Wound / Incision (Open or Dehisced) 12/26/22 Heel Right unstageable 12/26/22  1800  Heel  359            Pertinent labs and imaging:      Latest Ref Rng & Units 12/19/2023   10:27 AM 12/18/2023    7:58 AM 12/17/2023   10:29 AM  CBC  WBC 4.0 - 10.5 K/uL 10.1  7.8  10.0   Hemoglobin 12.0 - 15.0 g/dL 89.0  9.1  8.7   Hematocrit 36.0 - 46.0 % 33.1  26.8  25.9   Platelets 150 - 400 K/uL 333  272  259        Latest Ref Rng & Units 12/19/2023    9:29 PM 12/19/2023   10:27 AM 12/18/2023    7:58 AM  CMP  Glucose 70 - 99 mg/dL 833  795  829   BUN 8 - 23 mg/dL 7  6  7    Creatinine 0.44 - 1.00 mg/dL 9.47  9.54  9.60   Sodium 135 - 145 mmol/L 121  122  125   Potassium 3.5 - 5.1 mmol/L 4.2  4.3  3.6   Chloride 98 - 111 mmol/L 90  91  95   CO2 22 - 32 mmol/L 21  19  21    Calcium  8.9 - 10.3 mg/dL 7.6  8.3  7.9     No results found.  ASSESSMENT/PLAN:  Assessment: Principal Problem:   Femur fracture (HCC)  [name] is a 89 y.o. with a pertinent PMH of left femoral neck fracture s/p ORIF, essential HTN, meningioma s/p resection, right breast cancer s/p lumpectomy and radiation, asthma, seizure disorder, and anemia who presented to the ED after a fall and admitted right femoral fracture.   Plan: #Right Femoral Fracture  Orthopedic surgery recommended non-surgical management. Right knee immobilizer and bilateral pevalon boots are in place. Her pain is moderately controlled with current pain regimen, but manageable. Will discontinue Dilaudid  and continue oxycodone  for extended effects. Her mobility is limited and she is wheelchair bound at baseline.  Focus will be on supportive measures, pain control and appropriate rehab when discharged.   - Discontinue Hydromorphone  0.25 mg  - Continue scheduled tylenol  1000 mg every 8 hours - Continue Oxycodone  2.5 mg every 3 hours as needed for severe pain - See below for bowel regimen   #Constipation  1 bowel movement was documented overnight. Abdomen was mildly distended on the exam. Normal bowel sounds. Will decrease Miralax  and senokot frequency to once daily.   - Miralax  and senekot once daily  #Acute on Chronic Hyponatremia  Na 121 overnight, the lowest it has been since admission. Her Na baseline is in the high 120s for the past few years. BUN 7. Urine labs showed urine osmolality 256, urine Na 40. There is no signs of symptomatic hyponatremia. Given her chronicity of low Na, age, and comorbidities, the highest suspicion is still decreased oral intake with acute setting of SIADH.  - Fluid restriction and salt tablets started, will consider urea  tablets if worsened   - Monitor with BMP twice daily   #Normocytic Anemia Hgb 10.9. Improving from 9.1 yesterday. No signs of bleeding. Her hemoglobin levels have been the high 9s for the past year. She is on Eliquis  2.5 mg it seems since her last lower extremity fracture for possible DVT prophylaxis.  No signs of hemarthrosis on CT scan.  - Monitor with CBC - Continue Eliquis  2.5 mg daily - Transfuse if Hgb < 7  #Well-controlled Type 2 Diabetes Mellitus Glucose 114. A1c a year ago 6.9. Previously on Lantus  10 units and Novolog  2 units. While sugar goal is 140-180, there has been no signs of symptoms of hypoglycemia. Will monitor closely.  -  Continue sensitive sliding scale with glargine 5 units and aspart 0-9 units    #Bilateral Heel Wounds  Wound care evaluated heels and there was no signs of active infections.  Recommended heel elevation with foam or pillow.     Chronic Conditions:  #Essential Tremor Her tremor has affected her ability to eat  and feed herself, leading to having to rely on others for feeding and ambulation.  -  Continue primidone  250 mg every morning and 150 mg every afternoon  #Hypothyroidism TSH 2.9 a year ago and she has  been stable on Levothyroxine  74 mcg. - Continue Levothyroxine  75 mcg daily  #Anxiety  - Continue buspirone  5 mg 3 time daily   #Seizure Disorder  - Continue phenytoin  300 mg nightly  #Chronic Deconditioning and Immobility Patient is immobile and wheelchair-bound at baseline and right now has protective dressings on her heels. - Continue protective heel dressing   Best Practice: Diet: Regular diet IVF: Fluids VTE: apixaban  (ELIQUIS ) tablet 2.5 mg Start: 12/17/23 2200 Code: DNR  Disposition planning: Therapy Recs: SNF, DME: wheelchair Family Contact: Sister, to be notified. DISPO: Anticipated discharge in 2 days to Skilled nursing facility pending constipation and hyponatremia .  Signature:  An T Leontine [facility]  10:38 AM, 12/20/2023  On Call pager [phone]   Attestation for Student Documentation:  I personally was present and re-performed the history, physical exam and medical decision-making activities of this service and have verified that the service and findings are accurately documented in the student's note.  Harrie Bruckner, DO 12/20/2023, 7:29 PM

## 2023-12-20 NOTE — Plan of Care (Signed)

## 2023-12-21 LAB — BASIC METABOLIC PANEL WITH GFR
Anion gap: 7 (ref 5–15)
Anion gap: 7 (ref 5–15)
Anion gap: 9 (ref 5–15)
Anion gap: 10 (ref 5–15)
BUN: 6 mg/dL — ABNORMAL LOW (ref 8–23)
BUN: 7 mg/dL — ABNORMAL LOW (ref 8–23)
BUN: 13 mg/dL (ref 8–23)
BUN: 14 mg/dL (ref 8–23)
CO2: 21 mmol/L — ABNORMAL LOW (ref 22–32)
CO2: 21 mmol/L — ABNORMAL LOW (ref 22–32)
CO2: 23 mmol/L (ref 22–32)
CO2: 19 mmol/L — ABNORMAL LOW (ref 22–32)
Calcium: 7.8 mg/dL — ABNORMAL LOW (ref 8.9–10.3)
Calcium: 8.1 mg/dL — ABNORMAL LOW (ref 8.9–10.3)
Calcium: 8.7 mg/dL — ABNORMAL LOW (ref 8.9–10.3)
Calcium: 8.2 mg/dL — ABNORMAL LOW (ref 8.9–10.3)
Chloride: 91 mmol/L — ABNORMAL LOW (ref 98–111)
Chloride: 95 mmol/L — ABNORMAL LOW (ref 98–111)
Chloride: 94 mmol/L — ABNORMAL LOW (ref 98–111)
Chloride: 94 mmol/L — ABNORMAL LOW (ref 98–111)
Creatinine, Ser: 0.45 mg/dL (ref 0.44–1.00)
Creatinine, Ser: 0.42 mg/dL — ABNORMAL LOW (ref 0.44–1.00)
Creatinine, Ser: 0.38 mg/dL — ABNORMAL LOW (ref 0.44–1.00)
Creatinine, Ser: 0.4 mg/dL — ABNORMAL LOW (ref 0.44–1.00)
GFR, Estimated: >60 mL/min (ref >60)
GFR, Estimated: >60 mL/min (ref >60)
GFR, Estimated: >60 mL/min (ref >60)
GFR, Estimated: >60 mL/min (ref >60)
Glucose, Bld: 244 mg/dL — ABNORMAL HIGH (ref 70–99)
Glucose, Bld: 189 mg/dL — ABNORMAL HIGH (ref 70–99)
Glucose, Bld: 82 mg/dL (ref 70–99)
Glucose, Bld: 124 mg/dL — ABNORMAL HIGH (ref 70–99)
Potassium: 4 mmol/L (ref 3.5–5.1)
Potassium: 4.1 mmol/L (ref 3.5–5.1)
Potassium: 4.1 mmol/L (ref 3.5–5.1)
Potassium: 4.2 mmol/L (ref 3.5–5.1)
Sodium: 119 mmol/L — CL (ref 135–145)
Sodium: 123 mmol/L — ABNORMAL LOW (ref 135–145)
Sodium: 126 mmol/L — ABNORMAL LOW (ref 135–145)
Sodium: 123 mmol/L — ABNORMAL LOW (ref 135–145)

## 2023-12-21 LAB — CBC
HCT: 28.3 % — ABNORMAL LOW (ref 36.0–46.0)
Hemoglobin: 9.8 g/dL — ABNORMAL LOW (ref 12.0–15.0)
MCH: 30.1 pg (ref 26.0–34.0)
MCHC: 34.6 g/dL (ref 30.0–36.0)
MCV: 86.8 fL (ref 80.0–100.0)
Platelets: 425 K/uL — ABNORMAL HIGH (ref 150–400)
RBC: 3.26 MIL/uL — ABNORMAL LOW (ref 3.87–5.11)
RDW: 15.4 % (ref 11.5–15.5)
WBC: 8.8 K/uL (ref 4.0–10.5)
nRBC: 0 % (ref 0.0–0.2)

## 2023-12-21 LAB — GLUCOSE, CAPILLARY
Glucose-Capillary: 126 mg/dL — ABNORMAL HIGH (ref 70–99)
Glucose-Capillary: 177 mg/dL — ABNORMAL HIGH (ref 70–99)
Glucose-Capillary: 198 mg/dL — ABNORMAL HIGH (ref 70–99)
Glucose-Capillary: 265 mg/dL — ABNORMAL HIGH (ref 70–99)
Glucose-Capillary: 80 mg/dL (ref 70–99)

## 2023-12-21 MED ORDER — UREA 15 G PO PACK
15.0000 g | PACK | Freq: Two times a day (BID) | ORAL | Status: DC
  Administered 2023-12-21 (×2): 15 g via ORAL
  Filled 2023-12-21 (×4): qty 1

## 2023-12-21 MED ORDER — INSULIN GLARGINE 100 UNIT/ML ~~LOC~~ SOLN
10.0000 [IU] | Freq: Every day | SUBCUTANEOUS | Status: DC
  Administered 2023-12-21 – 2023-12-24 (×4): 10 [IU] via SUBCUTANEOUS
  Filled 2023-12-21 (×4): qty 0.1

## 2023-12-21 MED ORDER — INSULIN ASPART 100 UNIT/ML IJ SOLN
0.0000 [IU] | Freq: Three times a day (TID) | INTRAMUSCULAR | Status: DC
  Administered 2023-12-21 – 2023-12-22 (×3): 3 [IU] via SUBCUTANEOUS
  Administered 2023-12-22: 5 [IU] via SUBCUTANEOUS
  Administered 2023-12-23: 2 [IU] via SUBCUTANEOUS
  Administered 2023-12-23: 3 [IU] via SUBCUTANEOUS
  Administered 2023-12-23: 11 [IU] via SUBCUTANEOUS
  Administered 2023-12-24: 3 [IU] via SUBCUTANEOUS
  Administered 2023-12-24: 2 [IU] via SUBCUTANEOUS

## 2023-12-21 MED ORDER — OLANZAPINE 10 MG IM SOLR: 5.0000 mg | Freq: Once | INTRAMUSCULAR | Status: DC

## 2023-12-21 NOTE — TOC Progression Note (Signed)
 Transition of Care [facility]) - Progression Note    Patient Details  Name: [name] MRN: [id number] Date of Birth: [birth year]  Transition of Care [facility]) CM/SW Contact  Bridget Cordella Simmonds, LCSW Phone Number: 12/21/2023, 8:33 AM  Clinical Narrative:   CSW confirmed with [name] that pt can return when stable.  LTC pt with current STR recommendation.  Medicare payer with inpt order on 12/19/23.     Expected Discharge Plan: Skilled Nursing Facility Barriers to Discharge: Continued Medical Work up               Expected Discharge Plan and Services In-house Referral: Clinical Social Work     Living arrangements for the past 2 months: Skilled Nursing Facility                                       Social Drivers of Health (SDOH) Interventions SDOH Screenings   Food Insecurity: No Food Insecurity (12/18/2023)  Housing: Low Risk  (12/18/2023)  Transportation Needs: No Transportation Needs (12/18/2023)  Utilities: Not At Risk (12/18/2023)  Depression (PHQ2-9): Low Risk  (09/18/2020)  Social Connections: Moderately Isolated (12/18/2023)  Tobacco Use: Low Risk  (12/17/2023)    Readmission Risk Interventions    12/28/2022    3:43 PM 12/15/2022   11:12 AM 10/27/2022   12:30 PM  Readmission Risk Prevention Plan  Transportation Screening Complete Complete Complete  PCP or Specialist Appt within 3-5 Days Complete  Complete  HRI or Home Care Consult Complete  Complete  Social Work Consult for Recovery Care Planning/Counseling Complete  Complete  Palliative Care Screening Not Applicable    Medication Review Oceanographer) Complete Complete Complete  PCP or Specialist appointment within 3-5 days of discharge  Complete   HRI or Home Care Consult  Complete   SW Recovery Care/Counseling Consult  Complete   Palliative Care Screening  Not Applicable   Skilled Nursing Facility  Complete

## 2023-12-21 NOTE — Progress Notes (Cosign Needed Addendum)
 HD#2 SUBJECTIVE:  Patient Summary: [name] is a 89 y.o. with a pertinent PMH of f left femoral neck fracture s/p ORIF, essential HTN, meningioma s/p resection, right breast cancer s/p lumpectomy and radiation, asthma, seizure disorder, and anemia who presented to the ED after a fall and admitted right femoral fracture.   Overnight Events: Na 119 overnight and salt tablet was given.   Interim History: Patient was found sleeping on encounter and was not responding much to touch or questioning. She was drowsy but denied dizziness, lightheadedness. States that her pain is moderately controlled.   OBJECTIVE:  Vital Signs: Vitals:   12/20/23 0834 12/20/23 1620 12/20/23 1955 12/21/23 0423  BP: (!) 146/77 (!) 154/60 (!) 171/82 (!) 152/75  Pulse: 84 (!) 55 81 76  Resp: 17 18 15 17   Temp: 97.6 F (36.4 C) 98.7 F (37.1 C) (!) 97.5 F (36.4 C) 98 F (36.7 C)  TempSrc:   Oral Oral  SpO2: 96% 98% 99% 100%  Weight:      Height:       Supplemental O2: Room Air SpO2: 100 % O2 Flow Rate (L/min): 0 L/min FiO2 (%): 21 %  Filed Weights   12/18/23 0500 12/19/23 0434 12/20/23 0303  Weight: 76.2 kg 76 kg 76 kg     Intake/Output Summary (Last 24 hours) at 12/21/2023 0723 Last data filed at 12/21/2023 0259 Gross per 24 hour  Intake 240 ml  Output 1600 ml  Net -1360 ml   Net IO Since Admission: -2,690 mL [12/21/23 0723]  Physical Exam: Physical Exam Constitutional:      Comments: Pt appeared sleepy   Cardiovascular:     Rate and Rhythm: Normal rate and regular rhythm.     Pulses: Normal pulses.     Heart sounds: Normal heart sounds.  Pulmonary:     Effort: Pulmonary effort is normal.     Breath sounds: Normal breath sounds.  Musculoskeletal:     Comments: Knee immobilizer and bilateral pevalon boots in place      Patient Lines/Drains/Airways Status     Active Line/Drains/Airways     Name Placement date Placement time Site Days   Peripheral IV 12/17/23 20 G Left Antecubital  12/17/23  1027  Antecubital  4   Wound / Incision (Open or Dehisced) 12/26/22 Heel Right unstageable 12/26/22  1800  Heel  360            Pertinent labs and imaging:      Latest Ref Rng & Units 12/20/2023   10:09 AM 12/19/2023   10:27 AM 12/18/2023    7:58 AM  CBC  WBC 4.0 - 10.5 K/uL 8.2  10.1  7.8   Hemoglobin 12.0 - 15.0 g/dL 89.4  89.0  9.1   Hematocrit 36.0 - 46.0 % 31.0  33.1  26.8   Platelets 150 - 400 K/uL 445  333  272        Latest Ref Rng & Units 12/21/2023    1:30 AM 12/20/2023    8:35 PM 12/20/2023   10:09 AM  CMP  Glucose 70 - 99 mg/dL 755  811  824   BUN 8 - 23 mg/dL 6  6  6    Creatinine 0.44 - 1.00 mg/dL 9.54  9.58  9.60   Sodium 135 - 145 mmol/L 119  124  123   Potassium 3.5 - 5.1 mmol/L 4.0  4.2  4.2   Chloride 98 - 111 mmol/L 91  92  90  CO2 22 - 32 mmol/L 21  22  21    Calcium  8.9 - 10.3 mg/dL 7.8  8.5  8.5     No results found.  ASSESSMENT/PLAN:  Assessment: Principal Problem:   Femur fracture (HCC)  [name] is a 89 y.o. with a pertinent PMH of f left femoral neck fracture s/p ORIF, essential HTN, meningioma s/p resection, right breast cancer s/p lumpectomy and radiation, asthma, seizure disorder, and anemia who presented to the ED after a fall and admitted right femoral fracture.  Plan: Acute on Chronic Hyponatremia Type 2 Diabetes Mellitus  Na 119 over night, pseudohyponatremia effect influence from elevated glucose 244. With correction, Na is 121, a true hyponatremia still with highest suspicion for decreased oral intake and SIADH. There is also the possibility of non-adherence to fluid restriction. Repeat Na showed minimal improvement, will start urea  tablets.  - Urea  tablets 15g twice daily  - Continue fluid restriction and salt tablets 1g TID            - Monitor with BMP q8 daily   - Switch SSI to regular sliding scale   Right Femoral Fracture  Right knee immobilizer and bilateral pevalon boots are in place. Orthopedic surgery  recommended non-surgical management. Her pain is moderately controlled with current pain regimen, but manageable. Her mobility is limited and she is wheelchair bound at baseline. Focus will be on supportive measures, pain control and appropriate rehab when discharged.  - Continue scheduled tylenol  1000 mg every 8 hours - Continue Oxycodone  2.5 mg every 3 hours as needed for severe pain - See below for bowel regimen   Constipation 1 BM documented overnight.   - Continue Miralax  and senekot once daily  Normocytic Anemia  Hgb stable. There has been no signs of bleeding. Baseline is around high 9s for the past year. Currently on Eliquis  2.5 mg.  - Monitor with CBC   - Continue Eliquis  2.5 mg daily   - Transfuse if Hgb < 7  Bilateral Heel Wounds Wound care evaluated heels and there was no signs of active infections. Recommended heel elevation with foam or pillow.     Chronic Conditions:   Essential Tremor Her tremor has affected her ability to eat and feed herself, leading to having to rely on others for feeding and ambulation.  -  Continue primidone  250 mg every morning and 150 mg every afternoon  Hypothyroidism TSH 2.9 a year ago and she has been stable on Levothyroxine  74 mcg. - Continue Levothyroxine  75 mcg daily  Anxiety  - Continue buspirone  5 mg 3 time daily   Seizure Disorder  - Continue phenytoin  300 mg nightly  Chronic Deconditioning and Immobility Patient is immobile and wheelchair-bound at baseline and right now has protective dressings on her heels. - Continue protective heel dressing  Best Practice: Diet: Regular  VTE: apixaban  (ELIQUIS ) tablet 2.5 mg Start: 12/17/23 2200 Code: DNR  Disposition planning: Therapy Recs: SNF, DME: wheelchair Family Contact: Sister, to be notified. DISPO: Anticipated discharge in 2 days to Skilled nursing facility pending hyponatremia.  Signature:  An T Leontine Maris [facility] Medicine Residency  7:23 AM, 12/21/2023  On Call  pager [phone]   Attestation for Student Documentation:  I personally was present and re-performed the history, physical exam and medical decision-making activities of this service and have verified that the service and findings are accurately documented in the student's note.  Harrie Bruckner, DO 12/21/2023, 6:24 PM

## 2023-12-21 NOTE — Progress Notes (Signed)
 Dr. Myrna IM notified [phone]

## 2023-12-21 NOTE — Care Management Important Message (Signed)
 Important Message  Patient Details  Name: [name] MRN: [id number] Date of Birth: [birth year]   Important Message Given:  Yes - Medicare IM     Jon Cruel 12/21/2023, 2:43 PM

## 2023-12-21 NOTE — Progress Notes (Signed)
 Physical Therapy Treatment Patient Details Name: [name] MRN: [id number] DOB: 1929-10-15 Today's Date: 12/21/2023   History of Present Illness [name] is a 89 y.o. female who presented to [facility] ED 8/15 after a fall. X-ray showed minimally displaced distal right femoral fracture and moderate size suprapatellar joint effusion. CT of right knee demonstrated acute displaced fracture of the medial distal femoral  metadiaphysis and medial condyle with extension to the femoral  intercondylar fossa. PMHx: essential HTN, meningioma s/p resection, right breast cancer s/p lumpectomy and radiation, asthma, seizure disorder, and anemia; left femoral fracture s/p ORIF 12/12/2022.    PT Comments  Pt resting in bed on arrival, pleasant and agreeable to session, however pt greatly limited by pain, despite pre-medication prior to mobility. Pt requiring max-total A +2 to come to sitting EOB with helicopter technique utilized as pt unable to tolerate sequencing Les to and off EOB due to pain. Pt with strong posterior lean once seated up EOB, tearful and unable to be redirected. Pt returned to supine and positioned in partial chair position with pt coached in PLB breathing throughout with pt endorsing decreased pain at end of session. Pt continues to benefit from skilled PT services to progress toward functional mobility goals.      If plan is discharge home, recommend the following: Two people to help with walking and/or transfers;Two people to help with bathing/dressing/bathroom;Assistance with cooking/housework;Assistance with feeding;Assist for transportation;Help with stairs or ramp for entrance   Can travel by private vehicle     No  Equipment Recommendations       Recommendations for Other Services       Precautions / Restrictions Precautions Precautions: Fall Recall of Precautions/Restrictions: Impaired Required Braces or Orthoses: Knee Immobilizer - Right Knee Immobilizer - Right: On at all  times Restrictions Weight Bearing Restrictions Per Provider Order: Yes RLE Weight Bearing Per Provider Order: Non weight bearing     Mobility  Bed Mobility Overal bed mobility: Needs Assistance Bed Mobility: Supine to Sit, Sit to Supine     Supine to sit: Max assist, +2 for physical assistance, +2 for safety/equipment, HOB elevated Sit to supine: Max assist, Total assist, +2 for physical assistance, +2 for safety/equipment   General bed mobility comments: Pt requring cues for hand placement/technique and encouragement for participation throughout. Pivoted pt to/from EOB with use of bed pad. Assist to manage trunk and BLE. Repositioned with +2 assist, bed features, and bed pad. pt with poor tolerance despite pre-medication, strong posterior lean    Transfers Overall transfer level: Needs assistance                 General transfer comment: Pt requires a Hoyer-style mechanical lift for transfers    Ambulation/Gait                   Stairs             Wheelchair Mobility     Tilt Bed    Modified Rankin (Stroke Patients Only)       Balance Overall balance assessment: Needs assistance, History of Falls Sitting-balance support: Bilateral upper extremity supported, Feet supported Sitting balance-Leahy Scale: Poor Sitting balance - Comments: Pt requiring Mod to Max assist for balance sitting EOB Postural control: Posterior lean (Pt with heavy posterior lean; suspect pain is a factor)  Communication Communication Communication: No apparent difficulties  Cognition Arousal: Alert Behavior During Therapy: WFL for tasks assessed/performed, Anxious   PT - Cognitive impairments: No family/caregiver present to determine baseline                       PT - Cognition Comments: some tangential conversation, anxious with pain Following commands: Intact      Cueing Cueing Techniques: Verbal cues,  Gestural cues, Visual cues, Tactile cues  Exercises Other Exercises Other Exercises: pt unable to tolerate due to pain    General Comments General comments (skin integrity, edema, etc.): pt positonined in partial chair position with PRAFOs donned and breakfast tray set up at end of session      Pertinent Vitals/Pain Pain Assessment Pain Assessment: Faces Faces Pain Scale: Hurts worst Pain Location: R LE Pain Descriptors / Indicators: Discomfort, Grimacing, Guarding, Moaning, Aching, Crying Pain Intervention(s): Premedicated before session, Monitored during session, Limited activity within patient's tolerance    Home Living                          Prior Function            PT Goals (current goals can now be found in the care plan section) Acute Rehab PT Goals Patient Stated Goal: Have less pain PT Goal Formulation: With patient/family Time For Goal Achievement: 01/01/24 Progress towards PT goals: Not progressing toward goals - comment (pain)    Frequency    Min 1X/week      PT Plan      Co-evaluation              AM-PAC PT 6 Clicks Mobility   Outcome Measure  Help needed turning from your back to your side while in a flat bed without using bedrails?: Total Help needed moving from lying on your back to sitting on the side of a flat bed without using bedrails?: Total Help needed moving to and from a bed to a chair (including a wheelchair)?: Total Help needed standing up from a chair using your arms (e.g., wheelchair or bedside chair)?: Total Help needed to walk in hospital room?: Total Help needed climbing 3-5 steps with a railing? : Total 6 Click Score: 6    End of Session Equipment Utilized During Treatment: Right knee immobilizer Activity Tolerance: Patient limited by pain Patient left: in bed;with call bell/phone within reach;with bed alarm set Nurse Communication: Mobility status PT Visit Diagnosis: Pain;Other abnormalities of gait and  mobility (R26.89);Unsteadiness on feet (R26.81) Pain - Right/Left: Right Pain - part of body: Knee;Leg     Time: 9044-8978 PT Time Calculation (min) (ACUTE ONLY): 26 min  Charges:    $Therapeutic Activity: 23-37 mins PT General Charges $$ ACUTE PT VISIT: 1 Visit                     Rhonin Trott R. PTA Acute Rehabilitation Services Office: [phone]   [name] 12/21/2023, 10:58 AM

## 2023-12-21 NOTE — Plan of Care (Signed)
  Problem: Clinical Measurements: Goal: Ability to maintain clinical measurements within normal limits will improve Outcome: Progressing Goal: Will remain free from infection Outcome: Completed/Met Goal: Diagnostic test results will improve Outcome: Progressing Goal: Respiratory complications will improve Outcome: Progressing Goal: Cardiovascular complication will be avoided Outcome: Progressing   Problem: Activity: Goal: Risk for activity intolerance will decrease Outcome: Progressing   Problem: Nutrition: Goal: Adequate nutrition will be maintained Outcome: Progressing   Problem: Coping: Goal: Level of anxiety will decrease Outcome: Progressing   Problem: Elimination: Goal: Will not experience complications related to bowel motility Outcome: Progressing Goal: Will not experience complications related to urinary retention Outcome: Progressing   Problem: Pain Managment: Goal: General experience of comfort will improve and/or be controlled Outcome: Progressing   Problem: Safety: Goal: Ability to remain free from injury will improve Outcome: Progressing   Problem: Skin Integrity: Goal: Risk for impaired skin integrity will decrease Outcome: Progressing

## 2023-12-22 LAB — BASIC METABOLIC PANEL WITH GFR
Anion gap: 12 (ref 5–15)
Anion gap: 10 (ref 5–15)
Anion gap: 10 (ref 5–15)
BUN: 13 mg/dL (ref 8–23)
BUN: 25 mg/dL — ABNORMAL HIGH (ref 8–23)
BUN: 23 mg/dL (ref 8–23)
CO2: 19 mmol/L — ABNORMAL LOW (ref 22–32)
CO2: 20 mmol/L — ABNORMAL LOW (ref 22–32)
CO2: 19 mmol/L — ABNORMAL LOW (ref 22–32)
Calcium: 8 mg/dL — ABNORMAL LOW (ref 8.9–10.3)
Calcium: 8.1 mg/dL — ABNORMAL LOW (ref 8.9–10.3)
Calcium: 8 mg/dL — ABNORMAL LOW (ref 8.9–10.3)
Chloride: 93 mmol/L — ABNORMAL LOW (ref 98–111)
Chloride: 93 mmol/L — ABNORMAL LOW (ref 98–111)
Chloride: 95 mmol/L — ABNORMAL LOW (ref 98–111)
Creatinine, Ser: 0.49 mg/dL (ref 0.44–1.00)
Creatinine, Ser: 0.46 mg/dL (ref 0.44–1.00)
Creatinine, Ser: 0.48 mg/dL (ref 0.44–1.00)
GFR, Estimated: >60 mL/min (ref >60)
GFR, Estimated: >60 mL/min (ref >60)
GFR, Estimated: >60 mL/min (ref >60)
Glucose, Bld: 155 mg/dL — ABNORMAL HIGH (ref 70–99)
Glucose, Bld: 191 mg/dL — ABNORMAL HIGH (ref 70–99)
Glucose, Bld: 148 mg/dL — ABNORMAL HIGH (ref 70–99)
Potassium: 4 mmol/L (ref 3.5–5.1)
Potassium: 4.4 mmol/L (ref 3.5–5.1)
Potassium: 4 mmol/L (ref 3.5–5.1)
Sodium: 124 mmol/L — ABNORMAL LOW (ref 135–145)
Sodium: 123 mmol/L — ABNORMAL LOW (ref 135–145)
Sodium: 124 mmol/L — ABNORMAL LOW (ref 135–145)

## 2023-12-22 LAB — GLUCOSE, CAPILLARY
Glucose-Capillary: 153 mg/dL — ABNORMAL HIGH (ref 70–99)
Glucose-Capillary: 112 mg/dL — ABNORMAL HIGH (ref 70–99)
Glucose-Capillary: 201 mg/dL — ABNORMAL HIGH (ref 70–99)
Glucose-Capillary: 66 mg/dL — ABNORMAL LOW (ref 70–99)
Glucose-Capillary: 139 mg/dL — ABNORMAL HIGH (ref 70–99)

## 2023-12-22 MED ORDER — OXYCODONE HCL 5 MG PO TABS
5.0000 mg | ORAL_TABLET | ORAL | Status: DC | PRN
  Administered 2023-12-22 – 2023-12-24 (×8): 5 mg via ORAL
  Filled 2023-12-22 (×8): qty 1

## 2023-12-22 MED ORDER — SENNOSIDES-DOCUSATE SODIUM 8.6-50 MG PO TABS
1.0000 | ORAL_TABLET | Freq: Two times a day (BID) | ORAL | Status: DC
  Administered 2023-12-22 – 2023-12-24 (×3): 1 via ORAL
  Filled 2023-12-22 (×3): qty 1

## 2023-12-22 MED ORDER — UREA 15 G PO PACK
15.0000 g | PACK | Freq: Once | ORAL | Status: AC
  Administered 2023-12-22: 15 g via ORAL
  Filled 2023-12-22: qty 1

## 2023-12-22 MED ORDER — UREA 15 G PO PACK
30.0000 g | PACK | Freq: Two times a day (BID) | ORAL | Status: DC
  Administered 2023-12-22 – 2023-12-23 (×3): 30 g via ORAL
  Filled 2023-12-22 (×4): qty 2

## 2023-12-22 MED ORDER — PANTOPRAZOLE SODIUM 20 MG PO TBEC
20.0000 mg | DELAYED_RELEASE_TABLET | ORAL | Status: DC
  Administered 2023-12-23: 20 mg via ORAL
  Filled 2023-12-22: qty 1

## 2023-12-22 NOTE — Progress Notes (Signed)
 Initial Nutrition Assessment  DOCUMENTATION CODES:   Non-severe (moderate) malnutrition in context of chronic illness (chronic poor appetite related to aging and chronic illnesses)  INTERVENTION:  Magic cup TID with meals, each supplement provides 290 kcal and 9 grams of protein  Ensure Plus High Protein po BID, each supplement provides 350 kcal and 20 grams of protein.  Snacks BID to mimic small, frequent meals and help promote adequate intake to meet calorie and protein needs  Discontinued calorie count, but will continue to monitor intake while admitted  Discussed liberalizing diet with MD residents, residents in agreement with offering pt as many options as possible and liberalized diet to regular with fluid restriction  NUTRITION DIAGNOSIS:   Moderate Malnutrition related to chronic illness (chronic poor appetite related to chronic conditions) as evidenced by moderate fat depletion, severe muscle depletion.  GOAL:   Patient will meet greater than or equal to 90% of their needs  MONITOR:   PO intake, Supplement acceptance  REASON FOR ASSESSMENT:   Consult Calorie Count  ASSESSMENT:   Pt with hx diabetes, vitamin B12 deficiency, HTN, meningioma s/p resection, breast cancer s/p radiation/lumpectomy (2014), seizures, anemia, and L hip fx s/p ORIF (06/2020). Admitted after fall with R femoral fx.  8/15 admitted  Ortho following, recommending non-operative management. Pt with hyponatremia requiring urea  and salt tablets. Pt with mild pitting edema in BLE.  Spoke with pt who was resting in bed at time of assessment. Pt reports appetite has been poor last few days after diet order changed because she felt like she couldn't order as many things. Pt reports she had never followed specific diet for diabetes and was always told her blood sugar was well managed. Discussed liberalizing diet to regular to provide as many options available to pt. Also discussed adding magic cups to  trays and snacks BID to mimic small, frequent meals, pt agreeable. Pt had breakfast sitting in front of her at 10am but breakfast was cold. Suspect pt sleeps in usually and does not eat an early breakfast, discussed that 10am snack will be brought to pt's room each day in case her breakfast gets cold before she's ready to eat, pt requested hard boiled eggs and oatmeal for snack. Pt reports liking Ensure shakes, will continue offering. Pt reports abdomen pain and feeling gassy, abdomen felt slightly distended which Russum be impacting appetite.  PTA, pt reports she had a good appetite and would eat 3x per day. Pt reports breakfast consisted of eggs and oatmeal. Pt unable to provide full diet hx as she was easily distracted and off topic. Able to redirect pt and ask about likes/dislikes of foods. Pt reports no issues with chewing or swallowing. Pt reports taking lantus  daily to manage blood sugars, current A1c 5.9%.   Pt reports no recent wt changes, but pt has been retaining fluid in BLE which [place]man be masking wt loss. Will monitor changes in wt while admitted. Nutrition focused physical exam shows moderate fat depletion and severe muscle depletions. Suspect depletions related to malnutrition in the context of aging with multiple chronic health conditions. Suspect edema is masking further depletions. Pt is at risk for worsening malnutrition and wt loss if appetite and intake remains poor.   Pt reports she did not use any mobility assistive devices prior to her fall.  Average Meal Completion: 8/16-8/20: 0-100% with average 55% meal completion x 8 recorded meals (last day 0-20%)  Medications reviewed and include:  SSI TID Lantus  10 units daily Levothyroxine  Miralax   Senna Sodium chloride  1 g TID Urea  packet BID  Labs reviewed:  CBG x 24 hr: 80-198 mg/dL J8r 5.9 Sodium 875/ Chloride 93  NUTRITION - FOCUSED PHYSICAL EXAM:  Flowsheet Row Most Recent Value  Orbital Region Moderate depletion  Upper  Arm Region Mild depletion  Thoracic and Lumbar Region Unable to assess  [edema]  Buccal Region Moderate depletion  Temple Region Moderate depletion  Clavicle Bone Region Severe depletion  Clavicle and Acromion Bone Region Severe depletion  Scapular Bone Region Severe depletion  Dorsal Hand Severe depletion  Patellar Region Unable to assess  [BLE]  Anterior Thigh Region Unable to assess  [BLE]  Posterior Calf Region Unable to assess  [BLE]  Edema (RD Assessment) Mild  Hair Reviewed  Eyes Reviewed  Mouth Reviewed  Skin Reviewed  Nails Reviewed    Diet Order:   Diet Order             Diet regular Fluid consistency: Thin; Fluid restriction: 1200 mL Fluid  Diet effective now                   EDUCATION NEEDS:   Education needs have been addressed  Skin:  Skin Assessment: Reviewed RN Assessment  Last BM:  8/18 type 5  Height:   Ht Readings from Last 1 Encounters:  12/17/23 5' 8 (1.727 m)    Weight:   Wt Readings from Last 1 Encounters:  12/22/23 76.1 kg    Ideal Body Weight:  63.6 kg  BMI:  Body mass index is 25.51 kg/m.  Estimated Nutritional Needs:   Kcal:  1500-1700  Protein:  75-90g  Fluid:  1.5-1.7L    Josette Glance, MS, RDN, LDN Clinical Dietitian I Please reach out via secure chat

## 2023-12-22 NOTE — Plan of Care (Signed)
 Patient educated on needing to eat if needing pain medication. Patient will request pain medication, but fall asleep shortly after. Patient refusing labs, educated the need for sodium levels for care. [place], DO is aware. Will continue to monitor. Patient unable to turn q2hr d/t pain needs.    Problem: Education: Goal: Knowledge of General Education information will improve Description: Including pain rating scale, medication(s)/side effects and non-pharmacologic comfort measures Outcome: Progressing   Problem: Activity: Goal: Risk for activity intolerance will decrease Outcome: Progressing   Problem: Pain Managment: Goal: General experience of comfort will improve and/or be controlled Outcome: Progressing   Problem: Safety: Goal: Ability to remain free from injury will improve Outcome: Progressing   Problem: Skin Integrity: Goal: Risk for impaired skin integrity will decrease Outcome: Progressing

## 2023-12-22 NOTE — Progress Notes (Signed)
 HD#3 SUBJECTIVE:  Patient Summary: [name] is a 89 y.o. with a pertinent PMH of f left femoral neck fracture s/p ORIF, essential HTN, meningioma s/p resection, right breast cancer s/p lumpectomy and radiation, asthma, seizure disorder, and anemia who presented to the ED after a fall and admitted for right femoral fracture.    Overnight Events: None   Interim History: Patient was complaining of diffuse non-localized pain and was distressed this morning. There was abdominal discomfort. She was OA x3, and denied confusion, headache, nausea and vomiting.   OBJECTIVE:  Vital Signs: Vitals:   12/21/23 1518 12/21/23 2025 12/22/23 0500 12/22/23 0808  BP: 138/67 (!) 148/58  (!) 142/72  Pulse: 79 (!) 48  85  Resp: 18 18  20   Temp: 98.4 F (36.9 C) 98.4 F (36.9 C)  98.4 F (36.9 C)  TempSrc:  Oral    SpO2: 96% 97%  99%  Weight:   76.1 kg   Height:       Supplemental O2: Room Air SpO2: 99 % O2 Flow Rate (L/min): 0 L/min FiO2 (%): 21 %  Filed Weights   12/19/23 0434 12/20/23 0303 12/22/23 0500  Weight: 76 kg 76 kg 76.1 kg     Intake/Output Summary (Last 24 hours) at 12/22/2023 1011 Last data filed at 12/22/2023 9176 Gross per 24 hour  Intake 340 ml  Output --  Net 340 ml   Net IO Since Admission: -2,350 mL [12/22/23 1011]  Physical Exam: Physical Exam Constitutional:      Appearance: She is not ill-appearing.     Comments: Pt seemed distressed.  Cardiovascular:     Rate and Rhythm: Normal rate and regular rhythm.     Pulses: Normal pulses.     Heart sounds: Normal heart sounds.  Pulmonary:     Effort: Pulmonary effort is normal. No respiratory distress.     Breath sounds: Normal breath sounds.  Abdominal:     General: Bowel sounds are normal.     Palpations: Abdomen is soft.     Hernia: A hernia is present.     Comments: Umbilical hernia noted  Musculoskeletal:     Comments: Knee immobilizer and pevalon boots in place   Skin:    General: Skin is warm and dry.   Neurological:     Mental Status: She is oriented to person, place, and time.  Psychiatric:        Mood and Affect: Mood normal.        Behavior: Behavior normal.     Patient Lines/Drains/Airways Status     Active Line/Drains/Airways     Name Placement date Placement time Site Days   Peripheral IV 12/17/23 20 G Left Antecubital 12/17/23  1027  Antecubital  5   Wound / Incision (Open or Dehisced) 12/26/22 Heel Right unstageable 12/26/22  1800  Heel  361            Pertinent labs and imaging:     Latest Ref Rng & Units 12/21/2023    6:37 AM 12/20/2023   10:09 AM 12/19/2023   10:27 AM  CBC  WBC 4.0 - 10.5 K/uL 8.8  8.2  10.1   Hemoglobin 12.0 - 15.0 g/dL 9.8  89.4  89.0   Hematocrit 36.0 - 46.0 % 28.3  31.0  33.1   Platelets 150 - 400 K/uL 425  445  333        Latest Ref Rng & Units 12/22/2023    7:03 AM 12/21/2023  10:39 PM 12/21/2023    4:33 PM  CMP  Glucose 70 - 99 mg/dL 844  875  82   BUN 8 - 23 mg/dL 13  14  13    Creatinine 0.44 - 1.00 mg/dL 9.50  9.59  9.61   Sodium 135 - 145 mmol/L 124  123  126   Potassium 3.5 - 5.1 mmol/L 4.0  4.2  4.1   Chloride 98 - 111 mmol/L 93  94  94   CO2 22 - 32 mmol/L 19  19  23    Calcium  8.9 - 10.3 mg/dL 8.0  8.2  8.7     No results found.  ASSESSMENT/PLAN:  Assessment: Principal Problem:   Femur fracture (HCC)  [name] is a 89 y.o. with a pertinent PMH of f left femoral neck fracture s/p ORIF, essential HTN, meningioma s/p resection, right breast cancer s/p lumpectomy and radiation, asthma, seizure disorder, and anemia who presented to the ED after a fall and admitted for right femoral fracture.   Plan: Acute on Chronic Hyponatremia Na 124. BUN 14. She is chronically hyponatremic, though sodium level is still not within expected range. She has been asymptomatic and BUN has improved with urea  packets. There was documented unfinished meal yesterday; along with pain and her age, decreased oral intake is a considerable factor.  Will track her oral intake with caloric count and increase urea  packets.   - Start caloric count            - Increase urea  tablets to 30g twice daily  - Continue fluid restriction and salt tablets 1g TID - Continue protein supplements             - Monitor with BMP q8 daily              Right Femoral Fracture  Right knee immobilizer and bilateral pevalon boots are in place. Orthopedic surgery recommended non-surgical management. Her mobility is limited and she is wheelchair bound at baseline. Pain had complaints of inadequately controlled pain this morning.  - Increase Oxycodone  to 5 mg every 3 hours as needed for severe pain  - Continue scheduled tylenol  1000 mg every 8 hours - See below for bowel regimen    Constipation 1 BM documented overnight. Patient had abdominal discomfort, unclear if associated with bowel movement. Will monitor to see if it will resolve with increased pain regimen.             - Continue Miralax  once daily and senekot as needed   Type II Diabetes Mellitus  Glucose 153. Continue regular sliding scale.   Normocytic Anemia  Hgb stable. There has been no signs of bleeding. Baseline is around high 9s for the past year. Currently on Eliquis  2.5 mg.            - Monitor with CBC             - Continue Eliquis  2.5 mg daily             - Transfuse if Hgb < 7   Bilateral Heel Wounds Wound care evaluated heels and there was no signs of active infections. Recommended heel elevation with foam or pillow.    Chronic Conditions:    Essential Tremor Her tremor has affected her ability to eat and feed herself, leading to having to rely on others for feeding and ambulation.  -  Continue primidone  250 mg every morning and 150 mg every afternoon  Hypothyroidism TSH 2.9 a year  ago and she has been stable on Levothyroxine  74 mcg. - Continue Levothyroxine  75 mcg daily  Anxiety  - Continue buspirone  5 mg 3 time daily   Seizure Disorder  - Continue phenytoin  300 mg nightly   Chronic Deconditioning and Immobility Patient is immobile and wheelchair-bound at baseline and right now has protective dressings on her heels. - Continue protective heel dressing  Best Practice: Diet: Regular diet VTE: apixaban  (ELIQUIS ) tablet 2.5 mg Start: 12/17/23 2200 Code: DNR  Disposition planning: Therapy Recs: SNF, DME: wheelchair Family Contact: Sister, to be notified. DISPO: Anticipated discharge in 2 days to Skilled nursing facility pending hyponatremia.  Signature:  Carleton [place] T Leontine Maris [facility] Internal Medicine Residency  10:11 AM, 12/22/2023  On Call pager [phone]

## 2023-12-22 NOTE — Progress Notes (Signed)
 Occupational Therapy Treatment Patient Details Name: [name] MRN: [id number] DOB: 1929-12-07 Today's Date: 12/22/2023   History of present illness [name] is a 89 y.o. female who presented to [facility] ED 8/15 after a fall. X-ray showed minimally displaced distal right femoral fracture and moderate size suprapatellar joint effusion. CT of right knee demonstrated acute displaced fracture of the medial distal femoral  metadiaphysis and medial condyle with extension to the femoral  intercondylar fossa. PMHx: essential HTN, meningioma s/p resection, right breast cancer s/p lumpectomy and radiation, asthma, seizure disorder, and anemia; left femoral fracture s/p ORIF 12/12/2022.   OT comments  Pt with limited participation in OT session secondary to pt with low level of alertness, suspect due to medication. Pt demonstrating ability to complete grooming and limited self-feeding tasks from bed level this session with Set up-Supervision to Min assist with cues for initiation, maintaining attention to tasks, and maintaining alertness. Pt made good attempts to participate, but limited by decreased alertness. Acute OT to continue to follow. Post acute discharge, pt will benefit from intensive inpatient skilled rehab services < 3 hours per day to maximize rehab potential.      If plan is discharge home, recommend the following:  Two people to help with walking and/or transfers;Two people to help with bathing/dressing/bathroom;Assistance with cooking/housework;Assistance with feeding;Direct supervision/assist for medications management;Direct supervision/assist for financial management;Assist for transportation;Help with stairs or ramp for entrance   Equipment Recommendations  Other (comment) (defer to next level of care)    Recommendations for Other Services      Precautions / Restrictions Precautions Precautions: Fall Recall of Precautions/Restrictions: Impaired Required Braces or Orthoses: Knee Immobilizer  - Right Knee Immobilizer - Right: On at all times Restrictions Weight Bearing Restrictions Per Provider Order: Yes RLE Weight Bearing Per Provider Order: Non weight bearing       Mobility Bed Mobility Overal bed mobility: Needs Assistance             General bed mobility comments: Pt requiring Max assist to reposition in the bed and for participation in bed mobility    Transfers Overall transfer level: Needs assistance                 General transfer comment: deffered this session for pt/therapist safety due to pt with low level of alertness     Balance                                           ADL either performed or assessed with clinical judgement   ADL Overall ADL's : Needs assistance/impaired Eating/Feeding: Set up;Supervision/ safety;Bed level (with HOB elevated; cues for initation) Eating/Feeding Details (indicate cue type and reason): pt drinking water  from a cup with straw and taking medications from a small cup provided by RN with RN present Grooming: Wash/dry hands;Wash/dry face;Minimal assistance;Cueing for sequencing;Bed level (with HOB elevated; cues for iniation; cues to maintain attention to task; cues to maintain level of alertness)                                 General ADL Comments: Pt decreasedl level of alertness affecting funcitonal level this session    Extremity/Trunk Assessment Upper Extremity Assessment Upper Extremity Assessment: Right hand dominant;Generalized weakness;RUE deficits/detail;LUE deficits/detail RUE Deficits / Details: generalized weakness; decreased fine motor coordination;  noted mild tremor with purposeful movement with pt reporting this is baseline and varies in intensity from day to day RUE Coordination: decreased fine motor LUE Deficits / Details: generalized weakness; decreased fine motor coordination; noted mild tremor with purposeful movement with pt reporting this is baseline and  varies in intensity from day to day LUE Coordination: decreased fine motor   Lower Extremity Assessment Lower Extremity Assessment: Defer to PT evaluation        Vision   Vision Assessment?: No apparent visual deficits   Perception     Praxis     Communication Communication Communication: No apparent difficulties   Cognition Arousal: Lethargic, Obtunded, Suspect due to medications Behavior During Therapy: WFL for tasks assessed/performed, Flat affect Cognition: Cognition impaired     Awareness: Intellectual awareness intact, Online awareness impaired   Attention impairment (select first level of impairment): Sustained attention (due to level of alertness; suspect due to medications) Executive functioning impairment (select all impairments): Reasoning, Problem solving, Organization OT - Cognition Comments: Pt asleep with RN present and attempting to awake pt upon OT arrival. Pt would awaken briefly with tactile cues, but remained very drowsy and frequently beganing falling asleep again during session. Even when awake, pt largely keeping her eyes closed unless cues to open them. Per conversation with RN, pt with MD prescribed increased dosage of some medications since last OT session with RN suspecting medication is causing increased lethary. RN states she is already in communication with MD about this.                 Following commands: Impaired Following commands impaired: Follows one step commands inconsistently, Follows one step commands with increased time (due to level of alertness; suspect due to medications)      Cueing   Cueing Techniques: Verbal cues, Tactile cues  Exercises Exercises: Other exercises Other Exercises Other Exercises: OT attempted to educate pt in B UE AROM HEP; however, pt unable to maintain alertness or attention to task this session with pt beginning to fall asleep during UE therapeutic exercises. Per RN, suspect pt's decreased level of  alertness is medication related. OT plans to reattempt to educate pt in use of HEP next session.    Shoulder Instructions       General Comments RN present during a portion of session    Pertinent Vitals/ Pain       Pain Assessment Pain Assessment: Faces Faces Pain Scale: Hurts even more Pain Location: R LE Pain Descriptors / Indicators: Discomfort, Grimacing, Guarding, Moaning, Aching Pain Intervention(s): Limited activity within patient's tolerance, Monitored during session, Repositioned, Premedicated before session  Home Living                                          Prior Functioning/Environment              Frequency  Min 1X/week        Progress Toward Goals  OT Goals(current goals can now be found in the care plan section)  Progress towards OT goals: Progressing toward goals  Acute Rehab OT Goals Patient Stated Goal: pt did not state this session  Plan      Co-evaluation                 AM-PAC OT 6 Clicks Daily Activity     Outcome Measure   Help from another person eating  meals?: A Little Help from another person taking care of personal grooming?: A Little Help from another person toileting, which includes using toliet, bedpan, or urinal?: Total Help from another person bathing (including washing, rinsing, drying)?: A Lot Help from another person to put on and taking off regular upper body clothing?: A Lot Help from another person to put on and taking off regular lower body clothing?: Total 6 Click Score: 12    End of Session Equipment Utilized During Treatment: Right knee immobilizer  OT Visit Diagnosis: Unsteadiness on feet (R26.81);Other abnormalities of gait and mobility (R26.89);History of falling (Z91.81);Muscle weakness (generalized) (M62.81);Pain   Activity Tolerance Patient limited by lethargy   Patient Left in bed;with call bell/phone within reach;with bed alarm set   Nurse Communication Other (comment) (Pt  with decreased level of alertness suspect due to medication per RN)        Time: 8351-8295 OT Time Calculation (min): 16 min  Charges: OT General Charges $OT Visit: 1 Visit OT Treatments $Self Care/Home Management : 8-22 mins  Margarie Rockey HERO., OTR/L, MA Acute Rehab [phone]   Margarie FORBES Horns 12/22/2023, 6:25 PM

## 2023-12-22 NOTE — TOC Progression Note (Signed)
 Transition of Care [facility]) - Progression Note    Patient Details  Name: Ondrea Dow [place] MRN: [id number] Date of Birth: [birth year]  Transition of Care [facility]) CM/SW Contact  Bridget Cordella Simmonds, LCSW Phone Number: 12/22/2023, 11:17 AM  Clinical Narrative:   Per MD, pt not stable for DC.  CSW spoke with nephew Zell, sister Lindajo and updated them.  [name] still planning on pt return when table.  ICM will continue to follow.      Expected Discharge Plan: Skilled Nursing Facility Barriers to Discharge: Continued Medical Work up               Expected Discharge Plan and Services In-house Referral: Clinical Social Work     Living arrangements for the past 2 months: Skilled Nursing Facility                                       Social Drivers of Health (SDOH) Interventions SDOH Screenings   Food Insecurity: No Food Insecurity (12/18/2023)  Housing: Low Risk  (12/18/2023)  Transportation Needs: No Transportation Needs (12/18/2023)  Utilities: Not At Risk (12/18/2023)  Depression (PHQ2-9): Low Risk  (09/18/2020)  Social Connections: Moderately Isolated (12/18/2023)  Tobacco Use: Low Risk  (12/17/2023)    Readmission Risk Interventions    12/28/2022    3:43 PM 12/15/2022   11:12 AM 10/27/2022   12:30 PM  Readmission Risk Prevention Plan  Transportation Screening Complete Complete Complete  PCP or Specialist Appt within 3-5 Days Complete  Complete  HRI or Home Care Consult Complete  Complete  Social Work Consult for Recovery Care Planning/Counseling Complete  Complete  Palliative Care Screening Not Applicable    Medication Review [employer]) Complete Complete Complete  PCP or Specialist appointment within 3-5 days of discharge  Complete   HRI or Home Care Consult  Complete   SW Recovery Care/Counseling Consult  Complete   Palliative Care Screening  Not Applicable   Skilled Nursing Facility  Complete

## 2023-12-23 DIAGNOSIS — E440 Moderate protein-calorie malnutrition: Secondary | ICD-10-CM | POA: Insufficient documentation

## 2023-12-23 DIAGNOSIS — R5381 Other malaise: Secondary | ICD-10-CM

## 2023-12-23 LAB — CBC
HCT: 30.7 % — ABNORMAL LOW (ref 36.0–46.0)
Hemoglobin: 10.5 g/dL — ABNORMAL LOW (ref 12.0–15.0)
MCH: 30.1 pg (ref 26.0–34.0)
MCHC: 34.2 g/dL (ref 30.0–36.0)
MCV: 88 fL (ref 80.0–100.0)
Platelets: 559 K/uL — ABNORMAL HIGH (ref 150–400)
RBC: 3.49 MIL/uL — ABNORMAL LOW (ref 3.87–5.11)
RDW: 15.9 % — ABNORMAL HIGH (ref 11.5–15.5)
WBC: 13.1 K/uL — ABNORMAL HIGH (ref 4.0–10.5)
nRBC: 0 % (ref 0.0–0.2)

## 2023-12-23 LAB — BASIC METABOLIC PANEL WITH GFR
Anion gap: 12 (ref 5–15)
BUN: 42 mg/dL — ABNORMAL HIGH (ref 8–23)
CO2: 22 mmol/L (ref 22–32)
Calcium: 8.8 mg/dL — ABNORMAL LOW (ref 8.9–10.3)
Chloride: 93 mmol/L — ABNORMAL LOW (ref 98–111)
Creatinine, Ser: 0.49 mg/dL (ref 0.44–1.00)
GFR, Estimated: >60 mL/min (ref >60)
Glucose, Bld: 141 mg/dL — ABNORMAL HIGH (ref 70–99)
Potassium: 3.7 mmol/L (ref 3.5–5.1)
Sodium: 127 mmol/L — ABNORMAL LOW (ref 135–145)

## 2023-12-23 LAB — GLUCOSE, CAPILLARY
Glucose-Capillary: 174 mg/dL — ABNORMAL HIGH (ref 70–99)
Glucose-Capillary: 115 mg/dL — ABNORMAL HIGH (ref 70–99)
Glucose-Capillary: 143 mg/dL — ABNORMAL HIGH (ref 70–99)
Glucose-Capillary: 306 mg/dL — ABNORMAL HIGH (ref 70–99)
Glucose-Capillary: 212 mg/dL — ABNORMAL HIGH (ref 70–99)

## 2023-12-23 MED ORDER — ONDANSETRON HCL 4 MG/2ML IJ SOLN: 4.0000 mg | Freq: Three times a day (TID) | INTRAMUSCULAR | Status: DC | PRN

## 2023-12-23 MED ORDER — OXYCODONE HCL 5 MG PO TABS
2.5000 mg | ORAL_TABLET | Freq: Once | ORAL | Status: DC
  Filled 2023-12-23: qty 1

## 2023-12-23 MED ORDER — PANTOPRAZOLE SODIUM 20 MG PO TBEC
20.0000 mg | DELAYED_RELEASE_TABLET | Freq: Every day | ORAL | Status: DC
  Administered 2023-12-24: 20 mg via ORAL
  Filled 2023-12-23: qty 1

## 2023-12-23 MED ORDER — IBUPROFEN 200 MG PO TABS: 400.0000 mg | ORAL_TABLET | Freq: Four times a day (QID) | ORAL | Status: DC | PRN

## 2023-12-23 MED ORDER — ONDANSETRON 4 MG PO TBDP: 4.0000 mg | ORAL_TABLET | Freq: Three times a day (TID) | ORAL | Status: DC | PRN

## 2023-12-23 NOTE — Inpatient Diabetes Management (Signed)
 Inpatient Diabetes Program Recommendations  AACE/ADA: New Consensus Statement on Inpatient Glycemic Control (2015)  Target Ranges:  Prepandial:   less than 140 mg/dL      Peak postprandial:   less than 180 mg/dL (1-2 hours)      Critically ill patients:  140 - 180 mg/dL   Lab Results  Component Value Date   GLUCAP 115 (H) 12/23/2023   HGBA1C 5.9 (H) 12/17/2023    Review of Glycemic Control  Latest Reference Range & Units 12/22/23 06:12 12/22/23 11:38 12/22/23 16:30 12/22/23 21:08 12/22/23 22:16 12/23/23 06:36 12/23/23 09:17  Glucose-Capillary 70 - 99 mg/dL 846 (H) 887 (H) 798 (H) 66 (L) 139 (H) 174 (H) 115 (H)   Diabetes history: DM 2 Outpatient Diabetes medications: Lantus  17 units Daily Current orders for Inpatient glycemic control:  Lantus  10 units Daily Novolog  0-15 units tid   Ensure Plus high protein (19 grams of carbohydrates) bid between meals  Inpatient Diabetes Program Recommendations:    Note: hypoglycemia after Novolog  dose yesterday 66.  -  Reduce Novolog  Correction to 0-9 units tid and add HS scale  Thanks, Clotilda Bull RN, MSN, BC-ADM Inpatient Diabetes Coordinator Team Pager [phone] (8a-5p)

## 2023-12-23 NOTE — Progress Notes (Signed)
 Goals of care discussion with the patient and her sister due to ongoing low oral intake despite nursing and family help with feeding, meeting food preferences, and pain control without causing significant CNS depression.  The patient understands that she will not live very long if she does not eat but does not want to eat due to the pain.  We discussed that the pain medications needed to fully alleviate her pain have a high probability of making her too sleepy to be able to eat and she said that she would deal with that if it happens.  We continue to have discussions about comfort care, hospice care, and continuing current care.  She was very clear that she does not want anyone else making the decision for her but would not make a decision during this conversation.  She was clear that her priority is to be pain-free and that she is not worried or fearful of the end of her life.  She was okay with us  talking to her nephew, POA, but again was clear that she did not want anyone else making a decision on her goals of care other than herself.  We will consult palliative care for further help with goals of care discussions and will contact the nephew.  Fairy Pool, DO Internal Medicine Resident, PGY-3 Please contact the on call pager at [phone] for any urgent or emergent needs. 12:43 PM 12/23/2023

## 2023-12-23 NOTE — Progress Notes (Signed)
 HD#4 SUBJECTIVE:  Patient Summary: [name] is a 89 y.o. with a pertinent PMH of f left femoral neck fracture s/p ORIF, essential HTN, meningioma s/p resection, right breast cancer s/p lumpectomy and radiation, asthma, seizure disorder, and anemia who presented to the ED after a fall and admitted for right femoral fracture.   Overnight Events: None   Interim History: Patient still is demonstrating decreased alertness and decreased participation during morning round. She was alert and oriented x3, though reported feeling terrible. She was complaining of diffuse pain that was difficult to localize. She does not specify if hip or leg pain is present, but denies abdominal pain. She endorses appetite but did not finish magic cup last night.   OBJECTIVE:  Vital Signs: Vitals:   12/22/23 1928 12/23/23 0411 12/23/23 0500 12/23/23 0804  BP: 122/61 (!) 152/65  (!) 156/70  Pulse: (!) 55 85  87  Resp: 15 16  17   Temp: 97.6 F (36.4 C) 98.7 F (37.1 C)  (!) 97.5 F (36.4 C)  TempSrc:    Oral  SpO2: 96% 99%  98%  Weight:   78.3 kg   Height:       Supplemental O2: Room Air SpO2: 98 % O2 Flow Rate (L/min): 0 L/min FiO2 (%): 21 %  Filed Weights   12/20/23 0303 12/22/23 0500 12/23/23 0500  Weight: 76 kg 76.1 kg 78.3 kg     Intake/Output Summary (Last 24 hours) at 12/23/2023 0946 Last data filed at 12/23/2023 0900 Gross per 24 hour  Intake 260 ml  Output 100 ml  Net 160 ml   Net IO Since Admission: -2,190 mL [12/23/23 0946]  Physical Exam: Physical Exam Constitutional:      Comments: Pt seemed sleepy and not alert  Cardiovascular:     Rate and Rhythm: Normal rate and regular rhythm.     Pulses: Normal pulses.     Heart sounds: Normal heart sounds.  Pulmonary:     Effort: Pulmonary effort is normal.     Breath sounds: Normal breath sounds.  Musculoskeletal:     Comments: Knee immobilizer and pevalon boots in place   Skin:    General: Skin is warm and dry.     Patient  Lines/Drains/Airways Status     Active Line/Drains/Airways     Name Placement date Placement time Site Days   Wound / Incision (Open or Dehisced) 12/26/22 Heel Right unstageable 12/26/22  1800  Heel  362            Pertinent labs and imaging:     Latest Ref Rng & Units 12/21/2023    6:37 AM 12/20/2023   10:09 AM 12/19/2023   10:27 AM  CBC  WBC 4.0 - 10.5 K/uL 8.8  8.2  10.1   Hemoglobin 12.0 - 15.0 g/dL 9.8  89.4  89.0   Hematocrit 36.0 - 46.0 % 28.3  31.0  33.1   Platelets 150 - 400 K/uL 425  445  333        Latest Ref Rng & Units 12/22/2023   10:42 PM 12/22/2023    4:30 PM 12/22/2023    7:03 AM  CMP  Glucose 70 - 99 mg/dL 851  808  844   BUN 8 - 23 mg/dL 23  25  13    Creatinine 0.44 - 1.00 mg/dL 9.51  9.53  9.50   Sodium 135 - 145 mmol/L 124  123  124   Potassium 3.5 - 5.1 mmol/L 4.0  4.4  4.0   Chloride 98 - 111 mmol/L 95  93  93   CO2 22 - 32 mmol/L 19  20  19    Calcium  8.9 - 10.3 mg/dL 8.0  8.1  8.0     No results found.  ASSESSMENT/PLAN:  Assessment: Principal Problem:   Femur fracture (HCC) Active Problems:   Malnutrition of moderate degree  [name] is a 89 y.o. with a pertinent PMH of f left femoral neck fracture s/p ORIF, essential HTN, meningioma s/p resection, right breast cancer s/p lumpectomy and radiation, asthma, seizure disorder, and anemia who presented to the ED after a fall and admitted for right femoral fracture.   Plan: Acute on Chronic Hyponatremia Chronic Deconditioning and Immobility  Na 124. Na level has not shown significant increase and seems to have stabilized, though not quite back to her baseline before admission. She has been asymptomatic. Decreased oral intake due to age and co-morbodities, complicated by pain and limited ambulation, remains a big concern. We stressed the importance of adequate oral intake. Patient is immobile and wheelchair-bound at baseline, thus needing extensive assistance for ADLs and IADLs at discharge. We will  consult palliative care.   - Palliative consult pending            - Encourage adequate oral intake             - Continue urea  tablets to 30g twice daily  - Continue fluid restriction and salt tablets 1g TID - Continue protein supplements             - Monitor with BMP q8 daily              Right Femoral Fracture  Right knee immobilizer and bilateral pevalon boots are in place. Orthopedic surgery recommended non-surgical management. Source of reported pain was difficult to localize, although she reports alleviation of pain with current pain regimen. Goal is supportive measures, pain control and appropriate post-discharge rehab.  - Continue oxycodone  5 mg every 3 hours as needed for severe pain  - Continue scheduled tylenol  1000 mg every 8 hours - See below for bowel regimen    Constipation 2 BM documented overnight, with normal color and texture. It was unclear if patient's nonspecific pain is associated with bowels.             - Continue Miralax  once daily and senekot as needed   Type II Diabetes Mellitus  Glucose 174. Continue regular sliding scale.    Normocytic Anemia  Hgb stable. There has been no signs of bleeding. Baseline is around high 9s for the past year.             - Continue Eliquis  2.5 mg daily              Bilateral Heel Wounds Wound care evaluated heels and there was no signs of active infections. Recommended heel elevation with foam or pillow.    ---------- Chronic Conditions:    Essential Tremor Her tremor has affected her ability to eat and feed herself, leading to having to rely on others for feeding and ambulation.  - Continue primidone  250 mg every morning and 150 mg every afternoon  Hypothyroidism TSH 2.9 a year ago and she has been stable on Levothyroxine  74 mcg. - Continue Levothyroxine  75 mcg daily  Anxiety  - Continue buspirone  5 mg 3 time daily   Seizure Disorder  - Continue phenytoin  300 mg nightly     Best Practice: Diet: Regular  diet VTE: apixaban  (ELIQUIS ) tablet 2.5 mg Start: 12/17/23 2200 Code: DNR  Disposition planning: Therapy Recs: SNF, DME: wheelchair Family Contact: Sister, to be notified. DISPO: Anticipated discharge in 2 days to Skilled nursing facility pending Hyponatremia, Palliative consult .  Signature:  Temitope Flammer T Leontine [facility]  9:46 AM, 12/23/2023  On Call pager [phone]

## 2023-12-23 NOTE — Progress Notes (Signed)
 Physical Therapy Treatment Patient Details Name: [name] MRN: [id number] DOB: [birth year] Today's Date: 12/23/2023   History of Present Illness [name] is a 89 y.o. female who presented to [facility] ED 8/15 after a fall. X-ray showed minimally displaced distal right femoral fracture and moderate size suprapatellar joint effusion. CT of right knee demonstrated acute displaced fracture of the medial distal femoral  metadiaphysis and medial condyle with extension to the femoral  intercondylar fossa. PMHx: essential HTN, meningioma s/p resection, right breast cancer s/p lumpectomy and radiation, asthma, seizure disorder, and anemia; left femoral fracture s/p ORIF 12/12/2022.    PT Comments  Pt resting in bed on arrival, intermittently lethargic throughout session and greatly limited by pain with all movement/mobility.  Pt with with fair tolerance of gentle PROM at R ankle and hip with KI donned throughout session. Pt declining exercises on L. Transition to EOB deferred for pt and PTA safety. Pt positioned in chair position at end of session and RN updated. Pt continues to benefit from skilled PT services to progress toward functional mobility goals.     If plan is discharge home, recommend the following: Two people to help with walking and/or transfers;Two people to help with bathing/dressing/bathroom;Assistance with cooking/housework;Assistance with feeding;Assist for transportation;Help with stairs or ramp for entrance   Can travel by private vehicle     No  Equipment Recommendations       Recommendations for Other Services       Precautions / Restrictions Precautions Precautions: Fall Recall of Precautions/Restrictions: Impaired Required Braces or Orthoses: Knee Immobilizer - Right Knee Immobilizer - Right: On at all times Restrictions Weight Bearing Restrictions Per Provider Order: Yes RLE Weight Bearing Per Provider Order: Non weight bearing     Mobility  Bed Mobility Overal bed  mobility: Needs Assistance             General bed mobility comments: Pt requiring Max assist to reposition in the bed and for participation in bed mobility, positioned in parital chair position    Transfers Overall transfer level: Needs assistance                 General transfer comment: deffered this session due to pain with pt tearful    Ambulation/Gait                   Stairs             Wheelchair Mobility     Tilt Bed    Modified Rankin (Stroke Patients Only)       Balance Overall balance assessment: Needs assistance, History of Falls                                          Communication Communication Communication: No apparent difficulties  Cognition Arousal: Alert, Lethargic Behavior During Therapy: WFL for tasks assessed/performed, Flat affect                             Following commands: Impaired Following commands impaired: Follows one step commands inconsistently, Follows one step commands with increased time    Cueing Cueing Techniques: Verbal cues, Tactile cues  Exercises Other Exercises Other Exercises: gentle PROM at R ankle, AP and circles, gentle long axis PROM interl/external rotation at R hip    General Comments  Pertinent Vitals/Pain Pain Assessment Pain Assessment: Faces Faces Pain Scale: Hurts worst Pain Location: R LE Pain Descriptors / Indicators: Discomfort, Grimacing, Guarding, Moaning, Aching Pain Intervention(s): Monitored during session, Limited activity within patient's tolerance    Home Living                          Prior Function            PT Goals (current goals can now be found in the care plan section) Acute Rehab PT Goals Patient Stated Goal: Have less pain PT Goal Formulation: With patient/family Time For Goal Achievement: 01/01/24 Progress towards PT goals: Not progressing toward goals - comment (pain)    Frequency    Min  1X/week      PT Plan      Co-evaluation              AM-PAC PT 6 Clicks Mobility   Outcome Measure  Help needed turning from your back to your side while in a flat bed without using bedrails?: Total Help needed moving from lying on your back to sitting on the side of a flat bed without using bedrails?: Total Help needed moving to and from a bed to a chair (including a wheelchair)?: Total Help needed standing up from a chair using your arms (e.g., wheelchair or bedside chair)?: Total Help needed to walk in hospital room?: Total Help needed climbing 3-5 steps with a railing? : Total 6 Click Score: 6    End of Session Equipment Utilized During Treatment: Right knee immobilizer Activity Tolerance: Patient limited by pain Patient left: in bed;with call bell/phone within reach;with bed alarm set Nurse Communication: Mobility status PT Visit Diagnosis: Pain;Other abnormalities of gait and mobility (R26.89);Unsteadiness on feet (R26.81) Pain - Right/Left: Right Pain - part of body: Knee;Leg     Time: 9056-9043 PT Time Calculation (min) (ACUTE ONLY): 13 min  Charges:    $Therapeutic Activity: 8-22 mins PT General Charges $$ ACUTE PT VISIT: 1 Visit                     Acen Craun R. PTA Acute Rehabilitation Services Office: [phone]   [name] 12/23/2023, 1:06 PM

## 2023-12-24 DIAGNOSIS — Z993 Dependence on wheelchair: Secondary | ICD-10-CM

## 2023-12-24 LAB — BASIC METABOLIC PANEL WITH GFR
Anion gap: 10 (ref 5–15)
Anion gap: 9 (ref 5–15)
BUN: 46 mg/dL — ABNORMAL HIGH (ref 8–23)
BUN: 58 mg/dL — ABNORMAL HIGH (ref 8–23)
CO2: 21 mmol/L — ABNORMAL LOW (ref 22–32)
CO2: 22 mmol/L (ref 22–32)
Calcium: 8.3 mg/dL — ABNORMAL LOW (ref 8.9–10.3)
Calcium: 8.8 mg/dL — ABNORMAL LOW (ref 8.9–10.3)
Chloride: 94 mmol/L — ABNORMAL LOW (ref 98–111)
Chloride: 96 mmol/L — ABNORMAL LOW (ref 98–111)
Creatinine, Ser: 0.45 mg/dL (ref 0.44–1.00)
Creatinine, Ser: 0.67 mg/dL (ref 0.44–1.00)
GFR, Estimated: >60 mL/min (ref >60)
GFR, Estimated: >60 mL/min (ref >60)
Glucose, Bld: 178 mg/dL — ABNORMAL HIGH (ref 70–99)
Glucose, Bld: 236 mg/dL — ABNORMAL HIGH (ref 70–99)
Potassium: 3.7 mmol/L (ref 3.5–5.1)
Potassium: 4 mmol/L (ref 3.5–5.1)
Sodium: 125 mmol/L — ABNORMAL LOW (ref 135–145)
Sodium: 127 mmol/L — ABNORMAL LOW (ref 135–145)

## 2023-12-24 LAB — GLUCOSE, CAPILLARY
Glucose-Capillary: 171 mg/dL — ABNORMAL HIGH (ref 70–99)
Glucose-Capillary: 148 mg/dL — ABNORMAL HIGH (ref 70–99)
Glucose-Capillary: 112 mg/dL — ABNORMAL HIGH (ref 70–99)

## 2023-12-24 MED ORDER — SENNOSIDES-DOCUSATE SODIUM 8.6-50 MG PO TABS: 1.0000 | ORAL_TABLET | Freq: Two times a day (BID) | ORAL | Status: AC

## 2023-12-24 MED ORDER — MELATONIN 3 MG PO TABS: 3.0000 mg | ORAL_TABLET | Freq: Every day | ORAL | Status: AC

## 2023-12-24 MED ORDER — TRAZODONE HCL 50 MG PO TABS: 25.0000 mg | ORAL_TABLET | Freq: Every evening | ORAL | Status: AC | PRN

## 2023-12-24 MED ORDER — OXYCODONE HCL 5 MG PO TABS: 2.5000 mg | ORAL_TABLET | Freq: Three times a day (TID) | ORAL | 0 refills | Status: AC | PRN

## 2023-12-24 MED ORDER — PANTOPRAZOLE SODIUM 20 MG PO TBEC: 20.0000 mg | DELAYED_RELEASE_TABLET | Freq: Every day | ORAL | Status: AC

## 2023-12-24 NOTE — Plan of Care (Signed)

## 2023-12-24 NOTE — Discharge Summary (Addendum)
 Name: [name] MRN: [id number] DOB: [birth year] 89 y.o. PCP: Jolee Madelin Patch, MD  Date of Admission: 12/17/2023 10:12 AM Date of Discharge: 12/24/2023 Attending Physician: Dr. MICAEL Riis Winfrey  Discharge Diagnosis: 1. Principal Problem:   Femur fracture (HCC) Active Problems:   Malnutrition of moderate degree  Acute on Chronic Hyponatremia Chronic Deconditioning and Immobility  Constipation Hypokalemia, resolved  Type II Diabetes Mellitus  Essential tremor Hypothyroidism  Anxiety  Seizure Disorder  Discharge Medications: Allergies as of 12/24/2023       Reactions   Bystolic [nebivolol Hcl] Other (See Comments)   extreme weakness, heaviness in legs & arms, swelling in legs/arms/face, swollen abdomen, pain in bladder, feet pain, soreness in chest   Carbamazepine Other (See Comments)   Blood poisoning   Cholestyramine  Itching, Nausea And Vomiting, Rash, Other (See Comments)   itching rash on stomach, bloated, nausea, vomiting, sleeplessness, extreme pain in arms  Patient is taking this in 2022.   Morphine  Other (See Comments)   Feels morbid, weak, still in pain   Niacin Palpitations   Fast heart beat Other reaction(s): Unknown   Norvasc [amlodipine Besylate] Other (See Comments)   extreme fluid retention/pain)   Optivar [azelastine Hcl] Photosensitivity   Repaglinide  Hives   Sular [nisoldipine Er] Other (See Comments)   severe headaches, swelling eyes, hands, feet, shortness of breath, weak, flushed face, brain boiling, fluid retention, high blood sugar, nervous, heart fast beating   Telmisartan Other (See Comments)   headache, difficulty urinating, high blood sugar, fluid retention   Amoxicillin-pot Clavulanate Rash   Tolerates Unasyn  (>10 doses)   Cefdinir Swelling   Vaginal irritation, breathing,    Ciprofloxacin  Hcl Hives   Clonidine Other (See Comments)   Dry mouth, fluid retention   Codeine Nausea And Vomiting   Ezetimibe Other (See  Comments)   Made weak   Hydroxychloroquine Other (See Comments)   Low platlets   Naproxen Other (See Comments)   Shrinks bladder   Sulfa  Antibiotics Rash   Ziac [bisoprolol-hydrochlorothiazide] Other (See Comments)   stopped urination   Amlodipine Besy-benazepril Hcl Cough   Azelastine Other (See Comments)   Unknown reaction - Per MAR   Elavil [amitriptyline] Other (See Comments)   Gave Pt nightmares   Empagliflozin Other (See Comments)   Caused yeast infection, slowed my urine   Hydralazine  Other (See Comments)   does not reduce high blood pressure, pain in arm, high pressure, felt like I was on verge of heart attack, really weak   Iodine  I-131 Tositumomab Other (See Comments)   Unknown reaction - Per MAR   Kenalog  [triamcinolone ] Diarrhea   Per MAR   Keppra  [levetiracetam ] Other (See Comments)   Shaking   Lamotrigine  Itching, Other (See Comments)   Pravastatin Other (See Comments)   Pregabalin  Swelling, Other (See Comments)   Weight gain   Pseudoephedrine Other (See Comments)   Unknown reaction   Risedronate    Unknown reaction - Per MAR   Ru-hist D [brompheniramine-phenylephrine ] Other (See Comments)   Unknown reaction - Per MAR   Sulfur Other (See Comments)   sulfur   Sumatriptan Other (See Comments)   Topiramate  Other (See Comments)   Dry eyes   Ace Inhibitors Other (See Comments)   unknown   Aspirin  Other (See Comments)   Unknown reaction   Atacand [candesartan] Other (See Comments)   Unknown reaction - MAR   Bextra [valdecoxib] Other (See Comments)   Unknown reaction - Per MAR   Bisoprolol-hydrochlorothiazide Other (See  Comments)   Unknown reaction - Per MAR   Cefadroxil Other (See Comments)   Unknown reaction   Celecoxib Rash   Hydrocodone  Other (See Comments)   unknown   Iodinated Contrast Media Rash, Other (See Comments)   All over rash   Meloxicam Other (See Comments)   unknown   Methylprednisolone  Sodium Succinate  Other (See Comments)    unknown   Nabumetone Other (See Comments)   Unknown reaction   Penicillins Other (See Comments)   unknown   Pseudoephedrine-guaifenesin  Other (See Comments)   unknown   Rofecoxib Other (See Comments)   Unknown reaction - MAR Other reaction(s): Unknown   Ru-tuss [chlorphen-pse-atrop-hyos-scop] Other (See Comments)   unknown   Sulfonamide Derivatives Other (See Comments)   unknown   Sulphur [elemental Sulfur] Other (See Comments)   unknown   Telithromycin Other (See Comments)   unknown   Terfenadine Other (See Comments)   Unknown reaction - Per MAR   Trandolapril -verapamil  Hcl Er Other (See Comments)   Headache, difficulty urinating, high blood sugar, fluid retention Pt is taking Tarka  (trandolapril -verapamil ) currently, but requests the medication stay in her allergy list   Valium [diazepam] Other (See Comments)   Makes her mean and hyper        Medication List     STOP taking these medications    cholestyramine  light 4 g packet Commonly known as: PREVALITE    hydrocortisone  cream 1 %   traMADol  50 MG tablet Commonly known as: ULTRAM        TAKE these medications    Acetaminophen  325 MG Caps Take 650 mg by mouth 3 (three) times daily.   apixaban  2.5 MG Tabs tablet Commonly known as: Eliquis  Take 1 tablet (2.5 mg total) by mouth 2 (two) times daily.   Artificial Tears 0.2-0.2-1 % Soln Generic drug: Glycerin-Hypromellose-PEG 400 Apply 2 drops to eye in the morning, at noon, and at bedtime.   Biofreeze Cool The Pain 4 % Gel Generic drug: Menthol  (Topical Analgesic) Apply 1 g topically 3 (three) times daily. Apply to both knees   busPIRone  5 MG tablet Commonly known as: BUSPAR  Take 5 mg by mouth 3 (three) times daily.   cycloSPORINE  0.05 % ophthalmic emulsion Commonly known as: RESTASIS  Place 1 drop into both eyes 2 (two) times daily.   HM Lidocaine  Patch 4 % Generic drug: lidocaine  Place 2 patches onto the skin in the morning and at bedtime. Apply to  left knee and lower back   Insulin  Pen Needle 32G X 4 MM Misc Used to inject insulin  3x daily   Lantus  SoloStar 100 UNIT/ML Solostar Pen Generic drug: insulin  glargine Inject 10 Units into the skin at bedtime. What changed:  how much to take when to take this   levothyroxine  75 MCG tablet Commonly known as: SYNTHROID  Take 1 tablet (75 mcg total) by mouth daily before breakfast.   melatonin 3 MG Tabs tablet Take 1 tablet (3 mg total) by mouth at bedtime. What changed:  medication strength how much to take   oxyCODONE  5 MG immediate release tablet Commonly known as: Roxicodone  Take 0.5 tablets (2.5 mg total) by mouth every 8 (eight) hours as needed for up to 5 days. What changed:  when to take this reasons to take this   pantoprazole  20 MG tablet Commonly known as: PROTONIX  Take 1 tablet (20 mg total) by mouth daily. Start taking on: December 25, 2023 What changed: when to take this   Phenytek  300 MG ER capsule Generic drug: phenytoin  TAKE  1 CAPSULE AT BEDTIME   polyethylene glycol 17 g packet Commonly known as: MIRALAX  / GLYCOLAX  Take 17 g by mouth daily as needed. What changed: reasons to take this   primidone  50 MG tablet Commonly known as: MYSOLINE  Take 3 tablets ( 150 mg) at 4pm   primidone  250 MG tablet Commonly known as: MYSOLINE  Take 1 tablet (250 mg) at 8am   Prodigy Voice Blood Glucose w/Device Kit Use to check blood sugar 1 time per day.   psyllium 0.52 g capsule Commonly known as: REGULOID Take 2 capsules by mouth in the morning, at noon, and at bedtime.   senna-docusate 8.6-50 MG tablet Commonly known as: Senokot-S Take 1 tablet by mouth 2 (two) times daily.   sodium chloride  1 g tablet Take 1 g by mouth in the morning and at bedtime.   traZODone  50 MG tablet Commonly known as: DESYREL  Take 0.5 tablets (25 mg total) by mouth at bedtime as needed for sleep. What changed:  when to take this reasons to take this   Verapamil  HCl CR 200 MG  Cp24 Take 200 mg by mouth daily.   Vitamin D -3 125 MCG (5000 UT) Tabs Take 5,000 Units by mouth daily.               Durable Medical Equipment  (From admission, onward)           Start     Ordered   12/24/23 1332  DME standard manual wheelchair with seat cushion  Once       Comments: Patient suffers from immobility secondary to prior hip fracture, and right femoral fracture which impairs their ability to perform daily activities like bathing, dressing, feeding, grooming, and toileting in the home.  A walker will not resolve issue with performing activities of daily living. A wheelchair will allow patient to safely perform daily activities. Patient can safely propel the wheelchair in the home or has a caregiver who can provide assistance. Length of need Lifetime. Accessories: elevating leg rests (ELRs), wheel locks, extensions and anti-tippers.   12/24/23 1334              Discharge Care Instructions  (From admission, onward)           Start     Ordered   12/24/23 0000  Discharge wound care:       Comments: Heel foam to bilateral heels and Prevalon boots if able to apply around brace,.  IF not, float heels with a pillow under the calf.   12/24/23 1334            Disposition and follow-up:   Ms.Oriana S Linebaugh was discharged from [facility] in Stable condition.  At the hospital follow up visit please address:  1.  Right femoral fracture, Hyponatremia   2.  Labs / imaging needed at time of follow-up: BMP   3.  Pending labs/ test needing follow-up: None   Follow-up Appointments: Physician at [facility] will coordinate  Hospital Course by problem list:  Maleia Weems Ouzts is a 89 y.o. with a pertinent PMH of left femoral neck fracture s/p ORIF, essential HTN, meningioma s/p resection, right breast cancer s/p lumpectomy and radiation, asthma, seizure disorder, and anemia who presented to the ED after a fall and admitted for right femoral fracture now being  discharged on hospital day 5 with the following pertinent hospital course:  Right Femoral Fracture  Constipation XR showed displaced distal right femoral fracture with moderate suprapatellar joint effusion.  CT right knee showed decrease bone density, acute displaced fracture of medial distal femoral metadiaphysis and medial condyle that extends to the femoral intercondylar fossa, moderate joint effusion, and possible hemarthrosis. Non-surgical interventions were  recommended after orthopedic surgery consultation and a RLE knee immobilizer was placed.  She was initially started on scheduled tylenol  1000 mg, dilaudid  0.25 mg for breakthrough pain and oxycodone  2.5 m for severe pain. Dilaudid  was then discontinued and oxycodone  dose was increased, with moderate control. Bowel movements were normal with bowel regimen. Goal at discharge is continuing supportive measures, pain control and extensive rehab outpatient.   Acute on Chronic Hyponatremia Her hospital stay was significant for acute hyponatremia that was challenging to manage, and greatly complicated by ongoing pain and decreased oral intake, along with her age and immobility at baseline. Her Na baseline has been in the high 120s and she has been asymptomatic throughout this hospitalization. Her improvement was slow with fluid restriction, salt tablets and urea  packets, however the biggest contributing factor remained her decreased oral intake.   She showed steady decline since admission and compounded by her unwillingness to eat and drink. The importance of adequate nutrition was frequently stressed. On day of discharge, Na is within range of her baseline and patient was alert and conversant.   Type II Diabetes Mellitus Hgb A1c a year ago 6.9. Continue home insulin    Normocytic Anemia  Stable and there was no signs of bleeding. Before admission she was on Eliquis  2.5 mg since her last lower extremity fracture for possible DVT prophylaxis.  -  Continue Eliquis  2.5 mg daily   Essential Tremor Her tremor has affected her ability to eat and feed herself, leading to having to rely on others for feeding and ambulation.  - Continue primidone  250 mg every morning and 150 mg every afternoon   Hypothyroidism - Continue Levothyroxine  75 mcg daily   Anxiety  - Continue buspirone  5 mg 3 time daily    Seizure Disorder  - Continue phenytoin  300 mg nightly   ##Goals of Care Patient is immobile and wheelchair-bound at baseline, and will need extensive assistance for her ADLs and IADLs. Goals of care conversations were started inpatient involving her ongoing low oral intake despite nursing and family help with feeding, and her pain control. She stated she did not want anyone making decisions for her. POA is her nephew. Will get palliative involved to continue this conversation outpatient.     Subjective Patient was alert and conversant. She stated her pain was manageable. She denies any confusion or nonlocalized pain. She is medically ready for discharge.  Discharge Exam:   BP 123/63 (BP Location: Left Arm)   Pulse 74   Temp 98.3 F (36.8 C) (Oral)   Resp 18   Ht 5' 8 (1.727 m)   Wt 78.3 kg   SpO2 99%   BMI 26.25 kg/m  Discharge exam:  Physical Exam Constitutional:      General: She is not in acute distress. Cardiovascular:     Rate and Rhythm: Normal rate and regular rhythm.     Pulses: Normal pulses.     Heart sounds: Normal heart sounds.  Pulmonary:     Effort: Pulmonary effort is normal.     Breath sounds: Normal breath sounds.  Abdominal:     General: Abdomen is flat. There is no distension.     Palpations: Abdomen is soft.  Musculoskeletal:     Comments: RLE immobilizer and pevalon boots in place   Neurological:  Mental Status: She is alert.  Psychiatric:        Mood and Affect: Mood normal.        Behavior: Behavior normal.      Pertinent Labs, Studies, and Procedures:     Latest Ref Rng & Units  12/23/2023   11:53 AM 12/21/2023    6:37 AM 12/20/2023   10:09 AM  CBC  WBC 4.0 - 10.5 K/uL 13.1  8.8  8.2   Hemoglobin 12.0 - 15.0 g/dL 89.4  9.8  89.4   Hematocrit 36.0 - 46.0 % 30.7  28.3  31.0   Platelets 150 - 400 K/uL 559  425  445        Latest Ref Rng & Units 12/24/2023    6:41 AM 12/24/2023   12:04 AM 12/23/2023   11:53 AM  CMP  Glucose 70 - 99 mg/dL 821  763  858   BUN 8 - 23 mg/dL 46  58  42   Creatinine 0.44 - 1.00 mg/dL 9.54  9.32  9.50   Sodium 135 - 145 mmol/L 127  125  127   Potassium 3.5 - 5.1 mmol/L 4.0  3.7  3.7   Chloride 98 - 111 mmol/L 96  94  93   CO2 22 - 32 mmol/L 22  21  22    Calcium  8.9 - 10.3 mg/dL 8.8  8.3  8.8     CT KNEE RIGHT WO CONTRAST Result Date: 12/17/2023 CLINICAL DATA:  Knee trauma, occult fracture suspected, xray done EXAM: CT OF THE RIGHT KNEE WITHOUT CONTRAST TECHNIQUE: Multidetector CT imaging of the right knee was performed according to the standard protocol. Multiplanar CT image reconstructions were also generated. RADIATION DOSE REDUCTION: This exam was performed according to the departmental dose-optimization program which includes automated exposure control, adjustment of the mA and/or kV according to patient size and/or use of iterative reconstruction technique. COMPARISON:  X-ray tibia fibula 12/17/2023. x-ray right femur 12/17/2023 FINDINGS: Bones/Joint/Cartilage Diffusely decreased bone density. Acute displaced fracture of the medial distal femoral metadiaphysis and medial condyle (8:34; 7:44). Fracture extends to the femoral intercondylar fossa left bowel wall (7:39). No tibial plateau fracture. No proximal fibular fracture. No dislocation. Moderate joint effusion. Possible eval vein hemarthrosis with fluid-fluid level within the joint effusion. Ligaments Suboptimally assessed by CT. Muscles and Tendons Grossly unremarkable. Soft tissues Subcutaneus soft tissue edema.  Atherosclerotic plaque. IMPRESSION: 1. Acute displaced fracture of the  medial distal femoral metadiaphysis and medial condyle with extension to the femoral intercondylar fossa. 2. Moderate joint effusion. Electronically Signed   By: Morgane  Naveau M.D.   On: 12/17/2023 17:09   DG Femur Min 2 Views Right Result Date: 12/17/2023 CLINICAL DATA:  Fall. EXAM: RIGHT FEMUR 2 VIEWS COMPARISON:  None Available. FINDINGS: Possible minimally displaced distal right femoral fracture is noted. Moderate size suprapatellar joint effusion is noted. Status post right total hip arthroplasty. Vascular calcifications are noted. IMPRESSION: Possible minimally displaced distal right femoral fracture. Moderate size joint effusion is noted. CT scan is recommended for further evaluation. Electronically Signed   By: Lynwood Landy Raddle M.D.   On: 12/17/2023 11:27   DG Tibia/Fibula Right Result Date: 12/17/2023 CLINICAL DATA:  Fall. EXAM: RIGHT TIBIA AND FIBULA - 2 VIEW COMPARISON:  None Available. FINDINGS: There is no evidence of fracture or other focal bone lesions. Soft tissues are unremarkable. IMPRESSION: Negative. Electronically Signed   By: Lynwood Landy Raddle M.D.   On: 12/17/2023 11:25   DG Pelvis 1-2 Views Result  Date: 12/17/2023 CLINICAL DATA:  Fall. EXAM: PELVIS - 1-2 VIEW COMPARISON:  December 12, 2022. FINDINGS: Status post bilateral total hip arthroplasties. There is no evidence of pelvic fracture or diastasis. No pelvic bone lesions are seen. IMPRESSION: No acute abnormality seen. Electronically Signed   By: Lynwood Landy Raddle M.D.   On: 12/17/2023 11:24     Discharge Instructions: Discharge Instructions     Call MD for:  difficulty breathing, headache or visual disturbances   Complete by: As directed    Call MD for:  extreme fatigue   Complete by: As directed    Call MD for:  persistant dizziness or light-headedness   Complete by: As directed    Call MD for:  persistant nausea and vomiting   Complete by: As directed    Call MD for:  severe uncontrolled pain   Complete by: As  directed    Call MD for:  temperature >100.4   Complete by: As directed    Diet - low sodium heart healthy   Complete by: As directed    Discharge wound care:   Complete by: As directed    Heel foam to bilateral heels and Prevalon boots if able to apply around brace,.  IF not, float heels with a pillow under the calf.   Increase activity slowly   Complete by: As directed      Dear Ms. [place],   You were hospitalized for right knee fracture. There was no surgical interventions done. Your sodium level was also low, we think it was due to your pain and decreased food intake. You responded well to treatment and now your sodium level is in a stable range. We will give you some medication for your knee pain on discharge. Thank you for allowing us  to be part of your care.    Please note these changes made to your medications:    *Please START taking:             - Acetaminophen  500 mg 2 tablets every 8 hours              - Senna-docusate (senokot) 8.6-50 mg 1 tablet twice daily    *Please CHANGE how you take:             - Melatonin 3 mg: Take 1 tablet at bedtime             - Oxycodone  2.5 mg: Take 1 tablet every 3 hours as needed for severe pain              - Pantoprazole  (Protonix ) 20 mg: Take 1 tablet once daily             - Trazadone 50 mg: Take half a tablet as needed at bedtime for sleep   *Please STOP taking:             - Acetaminophen  325 mcg             - Cholestyramine  4g              - Hydrocortisone  cream              - Tramadol  50 mg    *Please CONTINUE taking:              - Apixaban  (Eliquis ) 2.5 mg twice daily             - Artificial tears 2 drops to eye in the morning, at noon and at bedtime             -  Biofreeze gel 3 times daily              - Buspirone  (Buspar ) 5 mg three times daily             - Cyclosporine  (Restasis ) eyedrops two times daily              - Lidocaine  patches             - Hydrocortisone  cream twice daily              - Insulin  glargine  (Lantus ) 10 units into the skin at bedtime              - Levothyroxine  75 mcg 1 tablet daily before breakfast              - Phenytoin  300 mg once daily at bedtime              - Polyethylene glycol (Miralax ) 17 g daily as needed              - Primidone  50 mg: 3 tablets at 4 pm             - Primidone  250 mg: 1 tablet at 8 am              - Psyllium 0.52 g: take 2 capsules in the morning, at noon, and at bedtime              - Sodium chloride  1g tablet: twice daily              - Verapamil  200 mg once daily             - Cholecalciferol  (Vitamin D3): Take 5000 units once daily     Please make sure to follow up with your primary care physician to make sure your fracture and pain medications are managed appropriately.    Please call our clinic if you have any questions or concerns, we [facility] be able to help and keep you from a long and expensive emergency room wait. Our clinic and after hours phone number is [phone], the best time to call is Monday through Friday 9 am to 4 pm but there is always someone available 24/7 if you have an emergency. If you need medication refills please notify your pharmacy one week in advance and they will send us  a request.    Signed: Nguyen, An T, Medical Student 12/24/2023, 2:37 PM      Attestation for Student Documentation:  I personally was present and re-performed the history, physical exam and medical decision-making activities of this service and have verified that the service and findings are accurately documented in the student's note.  Timarie Labell, MD 12/24/2023, 2:40 PM

## 2023-12-24 NOTE — Plan of Care (Signed)
 Palliative:  Consult received, upon review of chart noted discharge orders are in. Reached out to discharging MD - okay to refer to outpatient palliative.  Tobey Jama Barnacle, DNP, AGNP-C Palliative Medicine Team Team Phone # [phone]  Pager # [phone]  NO CHARGE

## 2023-12-24 NOTE — Plan of Care (Signed)
 Problem: Education: Goal: Knowledge of General Education information will improve Description: Including pain rating scale, medication(s)/side effects and non-pharmacologic comfort measures 12/24/2023 1614 by Shanti Agresti M, RN Outcome: Progressing 12/24/2023 1541 by Rieley Hausman M, RN Outcome: Progressing   Problem: Health Behavior/Discharge Planning: Goal: Ability to manage health-related needs will improve 12/24/2023 1614 by Tyla Burgner M, RN Outcome: Progressing 12/24/2023 1541 by Africa Masaki M, RN Outcome: Progressing   Problem: Clinical Measurements: Goal: Ability to maintain clinical measurements within normal limits will improve 12/24/2023 1614 by Trenika Hudson M, RN Outcome: Progressing 12/24/2023 1541 by Eisen Robenson M, RN Outcome: Progressing Goal: Diagnostic test results will improve 12/24/2023 1614 by Shiven Junious M, RN Outcome: Progressing 12/24/2023 1541 by Alexanderjames Berg M, RN Outcome: Progressing Goal: Respiratory complications will improve 12/24/2023 1614 by Tison Leibold M, RN Outcome: Progressing 12/24/2023 1541 by Meekah Math M, RN Outcome: Progressing Goal: Cardiovascular complication will be avoided 12/24/2023 1614 by Verniece Encarnacion M, RN Outcome: Progressing 12/24/2023 1541 by Lyndia Bury M, RN Outcome: Progressing   Problem: Activity: Goal: Risk for activity intolerance will decrease 12/24/2023 1614 by Orie Ananias HERO, RN Outcome: Progressing 12/24/2023 1541 by Orie Ananias HERO, RN Outcome: Progressing   Problem: Nutrition: Goal: Adequate nutrition will be maintained 12/24/2023 1614 by Orie Ananias HERO, RN Outcome: Progressing 12/24/2023 1541 by Orie Ananias HERO, RN Outcome: Progressing   Problem: Coping: Goal: Level of anxiety will decrease 12/24/2023 1614 by Orie Ananias HERO, RN Outcome: Progressing 12/24/2023 1541 by Orie Ananias HERO, RN Outcome: Progressing   Problem: Elimination: Goal:  Will not experience complications related to bowel motility 12/24/2023 1614 by Orie Ananias HERO, RN Outcome: Progressing 12/24/2023 1541 by Orie Ananias HERO, RN Outcome: Progressing Goal: Will not experience complications related to urinary retention 12/24/2023 1614 by Orie Ananias HERO, RN Outcome: Progressing 12/24/2023 1541 by Orie Ananias HERO, RN Outcome: Progressing   Problem: Pain Managment: Goal: General experience of comfort will improve and/or be controlled 12/24/2023 1614 by Kameo Bains M, RN Outcome: Progressing 12/24/2023 1541 by Orie Ananias HERO, RN Outcome: Progressing   Problem: Safety: Goal: Ability to remain free from injury will improve 12/24/2023 1614 by Junice Fei M, RN Outcome: Progressing 12/24/2023 1541 by Orie Ananias HERO, RN Outcome: Progressing   Problem: Skin Integrity: Goal: Risk for impaired skin integrity will decrease 12/24/2023 1614 by Orie Ananias HERO, RN Outcome: Progressing 12/24/2023 1541 by Orie Ananias HERO, RN Outcome: Progressing   Problem: Education: Goal: Ability to describe self-care measures that Sitzmann prevent or decrease complications (Diabetes Survival Skills Education) will improve 12/24/2023 1614 by Dorita Rowlands M, RN Outcome: Progressing 12/24/2023 1541 by Aylani Spurlock M, RN Outcome: Progressing Goal: Individualized Educational Video(s) 12/24/2023 1614 by Aryel Edelen M, RN Outcome: Progressing 12/24/2023 1541 by Corisa Montini M, RN Outcome: Progressing   Problem: Coping: Goal: Ability to adjust to condition or change in health will improve 12/24/2023 1614 by Kaylany Tesoriero M, RN Outcome: Progressing 12/24/2023 1541 by Merril Isakson M, RN Outcome: Progressing   Problem: Fluid Volume: Goal: Ability to maintain a balanced intake and output will improve 12/24/2023 1614 by Channon Brougher M, RN Outcome: Progressing 12/24/2023 1541 by Luisangel Wainright M, RN Outcome: Progressing   Problem:  Health Behavior/Discharge Planning: Goal: Ability to identify and utilize available resources and services will improve 12/24/2023 1614 by Aaleigha Bozza M, RN Outcome: Progressing 12/24/2023 1541 by Ceciley Buist M, RN Outcome: Progressing Goal: Ability to manage health-related needs will improve 12/24/2023 1614 by Maudell Stanbrough M, RN Outcome: Progressing 12/24/2023  1541 by Orie Ananias HERO, RN Outcome: Progressing   Problem: Metabolic: Goal: Ability to maintain appropriate glucose levels will improve 12/24/2023 1614 by Orie Ananias HERO, RN Outcome: Progressing 12/24/2023 1541 by Lissett Favorite M, RN Outcome: Progressing   Problem: Nutritional: Goal: Maintenance of adequate nutrition will improve 12/24/2023 1614 by Lodema Parma M, RN Outcome: Progressing 12/24/2023 1541 by Asako Saliba M, RN Outcome: Progressing Goal: Progress toward achieving an optimal weight will improve 12/24/2023 1614 by Purnell Daigle M, RN Outcome: Progressing 12/24/2023 1541 by Sedale Jenifer M, RN Outcome: Progressing   Problem: Skin Integrity: Goal: Risk for impaired skin integrity will decrease 12/24/2023 1614 by Orie Ananias HERO, RN Outcome: Progressing 12/24/2023 1541 by Hajar Penninger M, RN Outcome: Progressing   Problem: Tissue Perfusion: Goal: Adequacy of tissue perfusion will improve 12/24/2023 1614 by Jennalee Greaves M, RN Outcome: Progressing 12/24/2023 1541 by Elvis Laufer M, RN Outcome: Progressing   Problem: Safety: Goal: Non-violent Restraint(s) 12/24/2023 1614 by Dereon Williamsen M, RN Outcome: Progressing 12/24/2023 1541 by Kaelum Kissick M, RN Outcome: Progressing

## 2023-12-24 NOTE — Plan of Care (Signed)
  Problem: Education: Goal: Knowledge of General Education information will improve Description: Including pain rating scale, medication(s)/side effects and non-pharmacologic comfort measures Outcome: Progressing   Problem: Health Behavior/Discharge Planning: Goal: Ability to manage health-related needs will improve Outcome: Progressing   Problem: Clinical Measurements: Goal: Ability to maintain clinical measurements within normal limits will improve Outcome: Progressing Goal: Diagnostic test results will improve Outcome: Progressing Goal: Respiratory complications will improve Outcome: Progressing Goal: Cardiovascular complication will be avoided Outcome: Progressing   Problem: Activity: Goal: Risk for activity intolerance will decrease Outcome: Progressing   Problem: Nutrition: Goal: Adequate nutrition will be maintained Outcome: Progressing   Problem: Coping: Goal: Level of anxiety will decrease Outcome: Progressing   Problem: Elimination: Goal: Will not experience complications related to bowel motility Outcome: Progressing Goal: Will not experience complications related to urinary retention Outcome: Progressing   Problem: Pain Managment: Goal: General experience of comfort will improve and/or be controlled Outcome: Progressing   Problem: Safety: Goal: Ability to remain free from injury will improve Outcome: Progressing   Problem: Skin Integrity: Goal: Risk for impaired skin integrity will decrease Outcome: Progressing   Problem: Education: Goal: Ability to describe self-care measures that G[place] prevent or decrease complications (Diabetes Survival Skills Education) will improve Outcome: Progressing Goal: Individualized Educational Video(s) Outcome: Progressing   Problem: Coping: Goal: Ability to adjust to condition or change in health will improve Outcome: Progressing   Problem: Fluid Volume: Goal: Ability to maintain a balanced intake and output will  improve Outcome: Progressing   Problem: Health Behavior/Discharge Planning: Goal: Ability to identify and utilize available resources and services will improve Outcome: Progressing Goal: Ability to manage health-related needs will improve Outcome: Progressing   Problem: Metabolic: Goal: Ability to maintain appropriate glucose levels will improve Outcome: Progressing   Problem: Nutritional: Goal: Maintenance of adequate nutrition will improve Outcome: Progressing Goal: Progress toward achieving an optimal weight will improve Outcome: Progressing   Problem: Skin Integrity: Goal: Risk for impaired skin integrity will decrease Outcome: Progressing   Problem: Tissue Perfusion: Goal: Adequacy of tissue perfusion will improve Outcome: Progressing   Problem: Safety: Goal: Non-violent Restraint(s) Outcome: Progressing

## 2023-12-24 NOTE — TOC Transition Note (Signed)
 Transition of Care [facility]) - Discharge Note   Patient Details  Name: [name] MRN: [id number] Date of Birth: 12-Apr-1930  [facility]) CM/SW Contact:  Bridget Cordella Simmonds, LCSW Phone Number: 12/24/2023, 2:58 PM   Clinical Narrative:   Pt discharging to [facility], room 1108P.  RN call report to [phone].  PTAR called [phone].   1100: CSW confirmed with [name] that they can receive pt today.     Final next level of care: Skilled Nursing Facility Barriers to Discharge: Barriers Resolved   Patient Goals and CMS Choice Patient states their goals for this hospitalization and ongoing recovery are:: SNF   Choice offered to / list presented to : Patient      Discharge Placement              Patient chooses bed at: [facility] Patient to be transferred to facility by: ptar Name of family member notified: sister Lindajo in room Patient and family notified of of transfer: 12/24/23  Discharge Plan and Services Additional resources added to the After Visit Summary for   In-house Referral: Clinical Social Work                                   Social Drivers of Health (SDOH) Interventions SDOH Screenings   Food Insecurity: No Food Insecurity (12/18/2023)  Housing: Low Risk  (12/18/2023)  Transportation Needs: No Transportation Needs (12/18/2023)  Utilities: Not At Risk (12/18/2023)  Depression (PHQ2-9): Low Risk  (09/18/2020)  Social Connections: Moderately Isolated (12/18/2023)  Tobacco Use: Low Risk  (12/17/2023)     Readmission Risk Interventions    12/28/2022    3:43 PM 12/15/2022   11:12 AM 10/27/2022   12:30 PM  Readmission Risk Prevention Plan  Transportation Screening Complete Complete Complete  PCP or Specialist Appt within 3-5 Days Complete  Complete  HRI or Home Care Consult Complete  Complete  Social Work Consult for Recovery Care Planning/Counseling Complete  Complete  Palliative Care Screening Not Applicable    Medication Review Special educational needs teacher) Complete Complete Complete  PCP or Specialist appointment within 3-5 days of discharge  Complete   HRI or Home Care Consult  Complete   SW Recovery Care/Counseling Consult  Complete   Palliative Care Screening  Not Applicable   Skilled Nursing Facility  Complete

## 2023-12-24 NOTE — Progress Notes (Signed)
 Report given to [facility] of [facility].

## 2023-12-24 NOTE — Discharge Instructions (Addendum)
 Dear Ms. Cadden,  You were hospitalized for right knee fracture. There was no surgical interventions done. Your sodium level was also low, we think it was due to your pain and decreased food intake. You responded well to treatment and now your sodium level is in a stable range. We will give you some medication for your knee pain on discharge. Thank you for allowing us  to be part of your care.   Please note these changes made to your medications:   *Please START taking:  - Acetaminophen  500 mg 2 tablets every 8 hours   - Senna-docusate (senokot) 8.6-50 mg 1 tablet twice daily   *Please CHANGE how you take:  - Melatonin 3 mg: Take 1 tablet at bedtime  - Oxycodone  2.5 mg: Take 1 tablet every 3 hours as needed for severe pain   - Pantoprazole  (Protonix ) 20 mg: Take 1 tablet once daily  - Trazadone 50 mg: Take half a tablet as needed at bedtime for sleep  *Please STOP taking:  - Acetaminophen  325 mcg  - Cholestyramine  4g   - Hydrocortisone  cream   - Tramadol  50 mg   *Please CONTINUE taking:   - Apixaban  (Eliquis ) 2.5 mg twice daily  - Artificial tears 2 drops to eye in the morning, at noon and at bedtime  - Biofreeze gel 3 times daily   - Buspirone  (Buspar ) 5 mg three times daily  - Cyclosporine  (Restasis ) eyedrops two times daily   - Lidocaine  patches  - Hydrocortisone  cream twice daily   - Insulin  glargine (Lantus ) 10 units into the skin at bedtime   - Levothyroxine  75 mcg 1 tablet daily before breakfast   - Phenytoin  300 mg once daily at bedtime   - Polyethylene glycol (Miralax ) 17 g daily as needed   - Primidone  50 mg: 3 tablets at 4 pm  - Primidone  250 mg: 1 tablet at 8 am   - Psyllium 0.52 g: take 2 capsules in the morning, at noon, and at bedtime   - Sodium chloride  1g tablet: twice daily   - Verapamil  200 mg once daily  - Cholecalciferol  (Vitamin D3): Take 5000 units once daily   Please make sure to follow up with your primary care physician to make sure your fracture and  pain medications are managed appropriately.   Please call our clinic if you have any questions or concerns, we E[facility]ito be able to help and keep you from a long and expensive emergency room wait. Our clinic and after hours phone number is [phone], the best time to call is Monday through Friday 9 am to 4 pm but there is always someone available 24/7 if you have Omarri Eich emergency. If you need medication refills please notify your pharmacy one week in advance and they will send us  a request.

## 2024-01-11 ENCOUNTER — Ambulatory Visit: Admitting: Podiatry

## 2024-03-27 ENCOUNTER — Ambulatory Visit: Payer: Medicare Other | Admitting: Neurology

## 2024-03-27 ENCOUNTER — Encounter: Payer: Self-pay | Admitting: Neurology

## 2024-03-27 VITALS — BP 135/81 | HR 99

## 2024-03-27 DIAGNOSIS — R269 Unspecified abnormalities of gait and mobility: Secondary | ICD-10-CM

## 2024-03-27 DIAGNOSIS — G250 Essential tremor: Secondary | ICD-10-CM | POA: Diagnosis not present

## 2024-03-27 DIAGNOSIS — G4700 Insomnia, unspecified: Secondary | ICD-10-CM

## 2024-03-27 DIAGNOSIS — R29898 Other symptoms and signs involving the musculoskeletal system: Secondary | ICD-10-CM | POA: Diagnosis not present

## 2024-03-27 NOTE — Patient Instructions (Addendum)
 Continue current medications  Please ensure Primidone  250 mg is given before 8AM and Primidone  150 mg at 4PM. This would ensure that tremors are better manage throughout the day.  Call and left a message for Lyle, Nursing Director at [facility] to ensure patient gets her medications on time.  Continue to follow up with your doctors  Return in 6 months or sooner if worse

## 2024-03-27 NOTE — Progress Notes (Signed)
 [facility]  PATIENT: [name] DOB: 1929/09/22  REQUESTING CLINICIAN: Jolee Madelin Patch, MD HISTORY FROM: Patient, niece and chart review  REASON FOR VISIT: Seizure   HISTORICAL  CHIEF COMPLAINT:  Chief Complaint  Patient presents with   RM16/TREMOR    Pt is here with her Sister and caretaker. Pt states that her tremors are not everyday. Pt denies any new seizure activity.     INTERVAL HISTORY 03/27/2024 Patient presents today for follow-up, she is accompanied by her sister and caretaker.  Last visit was in July at that time she was complaining of bilateral legs weakness after hip surgery.  At that time, the bilateral leg weakness was attributed to deconditioning.  She tells me a few weeks after our visit she had a fall and fractured her femur.  She is in a wheelchair now and unable to ambulate.   When it comes to her tremors, she still experiences them, interfering with her daily activity such as eating and writing.  She is still having difficulty receiving her medication at the right time.  For instance this morning she has not taken her Primidone .  She tells me they usually do not give her the morning dose on time usually after breakfast or with breakfast.  The afternoon dose is given around 4 PM.  She tends to do better in the evening.   INTERVAL HISTORY 03/29/2023 Patient presents today for follow-up, she is accompanied by her sister.  Last visit was in September.  Since then she is doing well, no seizure or seizure like activity.  She still struggles with her essential tremor as [name] still gives her the morning dose around 930, 10 AM after her breakfast.  She continues to struggle when eating breakfast.  In the evening they give her the primidone  before supper, so no issue at in the evening.  Will contact [name] and have them correct the administration time   INTERVAL HISTORY 01/22/2023 Patient presents for follow-up, she is accompanied by her niece  [name].  Her last visit was in [place] at that time we increased the primidone  to 250 in the morning and 150 in the evening to control her essential tremor symptoms.  We also continued her phenytoin  XR 300 mg nightly for the seizures.  They tell me no seizure or seizure-like activity but the tremors are getting worse.  She is having extreme difficulty eating her breakfast to the point that her oral intake has decreased.  When asking about medication, she tells me that the morning dose of her primidone  is given around 10 AM after breakfast and the evening dose around 10 PM when she is ready to go to bed.  Again she is having extreme difficulty eating and now with fine movement.   INTERVAL HISTORY 09/21/2022:  [name] presents today for follow-up, she is accompanied by her niece [name].  Last visit was in October, since then she has not had any seizure or seizure-like activity.  She is compliant with the phenytoin  XR 300 mg nightly.  In terms of the essential tremor, she reports that sometimes in the evening she has worsening of her symptoms.  She is on primidone  250 in the morning and 50 mg in the evening but sometimes reports in the evening she can take up to 150 mg of primidone  to control her symptoms.  She continued to have gait abnormality and reports recent falls for which she had to call EMS.  Denies hitting her head.  Overall  she is stable.  She is independent but limited by her gait abnormality and overall weakness.   HISTORY OF PRESENT ILLNESS:  This is a 89 year old woman with multiple medical conditions including seizure disorder, essential tremor, type 2 diabetes mellitus, hypertension, right frontal meningioma s/p resection in 2015, colon cancer who is presenting for follow-up for seizure disorder.  Patient was previously seen by Dr. Jenel and Dr. Margaret for her seizures.  His last visit with Dr. Donita was in February 2023, at that time plan was to continue with phenytoin  200 mg in the  evening and also primidone  250 mg in the morning for her essential tremor.  In August she was admitted to the hospital for seizure work-up versus syncope.  Patient reports she was at home with a friend and then passed out on the couch, the friend could not arouse her and therefore she called EMS.  In the ED, was noted to be hyponatremic to 129, was observed in the emergency department and her condition continued to improve after IV hydration.  On discharge, she was recommended to increase her Dilantin  to 300 mg.  Since her hospitalization in August she has not had any additional seizures.  She did present to the hospital however for small bowel obstruction.  In terms of the seizure we have checked her Dilantin  which was low and plan was to increase it to 350 but due to miscommunication patient is actually taking 250 mg of Dilantin  nightly, again no seizures.  In terms of the tremors, she reports that tremors are still bothersome with eating, she spilled her food has difficulty using a fork she is currently on primidone  to 50 in the morning    PRIOR HPI (12/24/20; Dr. Jenel): [name] is an 89 year old right-handed-handed white female with a history of a seizure disorder, her last seizure was on 19 Bunker 2022.  The patient was in an extended care facility at that time and she is not completely sure that she was getting her medications properly.  She has a chronic gait disorder, she has fallen on occasion, she has not fallen recently.  She uses a walker to get around.  She has an essential tremor that affects her ability to feed herself at times.  She is on Mysoline  taking 150 mg in the morning and 100 mg in the evening.  She has history of diabetes.  She continues to be concerned about the tremor.  She wishes to have something done about this issue.  She questions whether ultrasound ablation of the brain Bussey be something she could consider.   PRIOR HPI (08/15/14, VRP): 89 year old female here for evaluation of tongue  laceration and possible seizures. In 2015 patient was having intermittent episodes of falling down. There is question of whether she had unresponsiveness with these events. She was initially evaluated for syncopal workup with heart testing. Ultimately she was admitted to the hospital in June 2015 for confusion, altered mental status, recurrent falls. She's found to have a large right frontal meningioma and right subdural hematoma. Patient was treated with surgical resection, Decadron  and antiseizure medication. Neurology consult was obtained who felt patient was also having intermittent complex partial seizures. She was discharged on Keppra  500 mg twice a day. 2 weeks after discharge patient stopped his medication on her own due to side effects of jitteriness, dizziness.   Patient continues to live on her own, continues to have intermittent falls and confusion spells. In 07/18/2014 patient woke up with large amount of blood  on her pillow and bed sheets. She had a severe tongue laceration. No muscle aches, incontinence, confusion. The next day patient went to the hospital for kyphoplasty procedure. On 07/20/2014 patient went to the emergency room for evaluation of possible infection of tongue laceration. She followed up with ENT Dr. Jesus. Due to history of meningioma and seizures, patient was referred back to me for consultation.   Patient still drives, short distances to church and grocery store. She is also had lower extremity weakness and numbness. She has diabetes   Previous ASMs: Levetiracetam , Lamotrigine , Carbamazepine, Topiramate    Currenty ASMs: Phenytoin  250 mg (she should be taking 350 mg), Primidone  250 mg     OTHER MEDICAL CONDITIONS: Seizure disorder, essential tremor, type 2 diabetes mellitus, hypertension, right frontal meningioma s/p resection in 2015, colon cancer  REVIEW OF SYSTEMS: Full 14 system review of systems performed and negative with exception of: As noted in the HPI    ALLERGIES: Allergies  Allergen Reactions   Bystolic [Nebivolol Hcl] Other (See Comments)    extreme weakness, heaviness in legs & arms, swelling in legs/arms/face, swollen abdomen, pain in bladder, feet pain, soreness in chest   Carbamazepine Other (See Comments)    Blood poisoning   Cholestyramine  Itching, Nausea And Vomiting, Rash and Other (See Comments)    itching rash on stomach, bloated, nausea, vomiting, sleeplessness, extreme pain in arms  Patient is taking this in 2022.   Morphine  Other (See Comments)    Feels morbid, weak, still in pain   Niacin Palpitations    Fast heart beat Other reaction(s): Unknown   Norvasc [Amlodipine Besylate] Other (See Comments)    extreme fluid retention/pain)   Optivar [Azelastine Hcl] Photosensitivity   Repaglinide  Hives   Sular [Nisoldipine Er] Other (See Comments)    severe headaches, swelling eyes, hands, feet, shortness of breath, weak, flushed face, brain boiling, fluid retention, high blood sugar, nervous, heart fast beating   Telmisartan Other (See Comments)    headache, difficulty urinating, high blood sugar, fluid retention   Amoxicillin-Pot Clavulanate Rash    Tolerates Unasyn  (>10 doses)   Cefdinir Swelling    Vaginal irritation, breathing,    Ciprofloxacin  Hcl Hives   Clonidine Other (See Comments)    Dry mouth, fluid retention   Codeine Nausea And Vomiting   Ezetimibe Other (See Comments)    Made weak   Hydroxychloroquine Other (See Comments)    Low platlets   Naproxen Other (See Comments)    Shrinks bladder   Sulfa  Antibiotics Rash   Ziac [Bisoprolol-Hydrochlorothiazide] Other (See Comments)    stopped urination   Amlodipine Besy-Benazepril Hcl Cough   Azelastine Other (See Comments)    Unknown reaction - Per MAR   Brompheniramine-Phenylephrine     Elavil [Amitriptyline] Other (See Comments)    Gave Pt nightmares   Empagliflozin Other (See Comments)    Caused yeast infection, slowed my urine    Hydralazine  Other (See Comments)    does not reduce high blood pressure, pain in arm, high pressure, felt like I was on verge of heart attack, really weak   Hydrochlorothiazide Other (See Comments)   Iodine  I-131 Tositumomab Other (See Comments)    Unknown reaction - Per MAR   Kenalog  [Triamcinolone ] Diarrhea    Per MAR   Keppra  [Levetiracetam ] Other (See Comments)    Shaking   Lamotrigine  Itching and Other (See Comments)   Pioglitazone  Other (See Comments)   Pravastatin Other (See Comments)   Pregabalin  Swelling and Other (See Comments)  Weight gain   Pseudoephedrine Other (See Comments)    Unknown reaction   Risedronate     Unknown reaction - Per MAR   Ru-Hist D [Brompheniramine-Phenylephrine ] Other (See Comments)    Unknown reaction - Per MAR   Sulfur Other (See Comments)    sulfur   Sumatriptan Other (See Comments)   Topiramate  Other (See Comments)    Dry eyes   Ace Inhibitors Other (See Comments)    unknown   Aspirin  Other (See Comments)    Unknown reaction   Atacand [Candesartan] Other (See Comments)    Unknown reaction - MAR   Bextra [Valdecoxib] Other (See Comments)    Unknown reaction - Per MAR   Bisoprolol-Hydrochlorothiazide Other (See Comments)    Unknown reaction - Per MAR   Cefadroxil Other (See Comments)    Unknown reaction   Celecoxib Rash   Hydrocodone  Other (See Comments)    unknown   Iodinated Contrast Media Rash and Other (See Comments)    All over rash   Meloxicam Other (See Comments)    unknown   Methylprednisolone  Sodium Succinate  Other (See Comments)    unknown   Nabumetone Other (See Comments)    Unknown reaction   Penicillins Other (See Comments)    unknown   Pseudoephedrine-Guaifenesin  Other (See Comments)    unknown   Rofecoxib Other (See Comments)    Unknown reaction - MAR Other reaction(s): Unknown   Ru-Tuss [Chlorphen-Pse-Atrop-Hyos-Scop] Other (See Comments)    unknown   Sulfonamide Derivatives Other (See Comments)     unknown   Sulphur [Elemental Sulfur] Other (See Comments)    unknown   Telithromycin Other (See Comments)    unknown   Terfenadine Other (See Comments)    Unknown reaction - Per MAR   Trandolapril -Verapamil  Hcl Er Other (See Comments)    Headache, difficulty urinating, high blood sugar, fluid retention  Pt is taking Tarka  (trandolapril -verapamil ) currently, but requests the medication stay in her allergy list   Valium [Diazepam] Other (See Comments)    Makes her mean and hyper    HOME MEDICATIONS: Outpatient Medications Prior to Visit  Medication Sig Dispense Refill   Acetaminophen  325 MG CAPS Take 650 mg by mouth 3 (three) times daily.     apixaban  (ELIQUIS ) 2.5 MG TABS tablet Take 1 tablet (2.5 mg total) by mouth 2 (two) times daily. 60 tablet 0   Blood Glucose Monitoring Suppl (PRODIGY VOICE BLOOD GLUCOSE) w/Device KIT Use to check blood sugar 1 time per day. 1 each 2   busPIRone  (BUSPAR ) 5 MG tablet Take 5 mg by mouth 3 (three) times daily.     Cholecalciferol  (VITAMIN D -3) 125 MCG (5000 UT) TABS Take 5,000 Units by mouth daily.     cholestyramine  light (PREVALITE ) 4 g packet Take 1 packet by mouth 2 (two) times daily.     cycloSPORINE  (RESTASIS ) 0.05 % ophthalmic emulsion Place 1 drop into both eyes 2 (two) times daily.     Glycerin-Hypromellose-PEG 400 (ARTIFICIAL TEARS) 0.2-0.2-1 % SOLN Apply 2 drops to eye in the morning, at noon, and at bedtime.     insulin  glargine (LANTUS  SOLOSTAR) 100 UNIT/ML Solostar Pen Inject 10 Units into the skin at bedtime. (Patient taking differently: Inject 17 Units into the skin daily.)     Insulin  Pen Needle 32G X 4 MM MISC Used to inject insulin  3x daily 270 each 2   levothyroxine  (SYNTHROID ) 75 MCG tablet Take 1 tablet (75 mcg total) by mouth daily before breakfast. 30 tablet 0  lidocaine  (HM LIDOCAINE  PATCH) 4 % Place 2 patches onto the skin in the morning and at bedtime. Apply to left knee and lower back     melatonin 3 MG TABS tablet Take 1  tablet (3 mg total) by mouth at bedtime.     Menthol , Topical Analgesic, (BIOFREEZE COOL THE PAIN) 4 % GEL Apply 1 g topically 3 (three) times daily. Apply to both knees     oxyCODONE  (OXY IR/ROXICODONE ) 5 MG immediate release tablet Take 5 mg by mouth 3 (three) times daily as needed.     pantoprazole  (PROTONIX ) 20 MG tablet Take 1 tablet (20 mg total) by mouth daily.     PHENYTEK  300 MG ER capsule TAKE 1 CAPSULE AT BEDTIME 90 capsule 3   polyethylene glycol (MIRALAX  / GLYCOLAX ) 17 g packet Take 17 g by mouth daily as needed. (Patient taking differently: Take 17 g by mouth daily as needed for mild constipation.) 14 each 0   primidone  (MYSOLINE ) 250 MG tablet Take 1 tablet (250 mg) at 8am     primidone  (MYSOLINE ) 50 MG tablet Take 3 tablets ( 150 mg) at 4pm     psyllium (REGULOID) 0.52 g capsule Take 2 capsules by mouth in the morning, at noon, and at bedtime.     senna-docusate (SENOKOT-S) 8.6-50 MG tablet Take 1 tablet by mouth 2 (two) times daily.     sodium chloride  1 g tablet Take 1 g by mouth in the morning and at bedtime.     traZODone  (DESYREL ) 50 MG tablet Take 0.5 tablets (25 mg total) by mouth at bedtime as needed for sleep.     Verapamil  HCl CR 200 MG CP24 Take 200 mg by mouth daily.     No facility-administered medications prior to visit.    PAST MEDICAL HISTORY: Past Medical History:  Diagnosis Date   Anemia    Anxiety    Asthma    Breast cancer (HCC) 2014   right breast   Cancer of right breast (HCC) 12/26/2012   right breast 12:00 o'clock, DCIS   Carotid artery disease    Carpal tunnel syndrome, bilateral    Chronic bronchitis (HCC)    Chronic cough    Chronic diarrhea    3 years   Chronic facial pain    Chronic foot pain    Colon cancer (HCC) dx'd 11/2019   Complication of anesthesia    Sore jaw; could not chew or move mouth, prolonged sedation   Convulsions/seizures (HCC) 10/16/2014   Diabetes mellitus    type 2 niddm x 20 years   Dyslipidemia    Ejection  fraction    Gait abnormality 12/04/2019   GERD (gastroesophageal reflux disease)    Hammer toe    bilateral   History of colonic polyps    History of meningioma    HTN (hypertension)    Hx of radiation therapy 03/07/13- 03/29/13   right breast 4250 cGy 17 sessions   Hyperlipidemia    Hypokalemia    Hyponatremia    Hypothyroidism    IBS (irritable bowel syndrome)    Melanoma (HCC)    Metatarsal bone fracture 2014   Multiple drug allergies    Nausea & vomiting 03/12/2020   Nontoxic thyroid  nodule    Obesity    Osteoarthritis    Osteoporosis    Palpitations    Personal history of radiation therapy 2014   Seasonal allergies    Skin cancer    Syncope    Tremor, essential 08/18/2016  Vitamin B12 deficiency    Vitamin D  deficiency     PAST SURGICAL HISTORY: Past Surgical History:  Procedure Laterality Date   ABDOMINAL HYSTERECTOMY     BIOPSY  11/07/2019   Procedure: BIOPSY;  Surgeon: Donnald Charleston, MD;  Location: [facility];  Service: Endoscopy;;   BRAIN SURGERY     BREAST BIOPSY Right 01/24/2013   Procedure: RE-EXCICION OF BREAST CANCER, ANTERIOR MARGINS;  Surgeon: Morene ONEIDA Olives, MD;  Location: [facility];  Service: General;  Laterality: Right;   BREAST LUMPECTOMY Right 2014   BREAST LUMPECTOMY WITH NEEDLE LOCALIZATION Right 01/17/2013   Procedure: BREAST LUMPECTOMY WITH NEEDLE LOCALIZATION;  Surgeon: Morene ONEIDA Olives, MD;  Location: [facility];  Service: General;  Laterality: Right;   BUNIONECTOMY Bilateral    CATARACT EXTRACTION W/ INTRAOCULAR LENS IMPLANT Right    CHOLECYSTECTOMY     COLONOSCOPY     COLONOSCOPY WITH PROPOFOL  N/A 11/07/2019   Procedure: COLONOSCOPY WITH PROPOFOL ;  Surgeon: Donnald Charleston, MD;  Location: [facility];  Service: Endoscopy;  Laterality: N/A;   CRANIOTOMY Right 10/18/2013   Procedure: CRANIOTOMY TUMOR EXCISION;  Surgeon: Catalina CHRISTELLA Stains, MD;  Location: [facility];  Service: Neurosurgery;  Laterality: Right;   ESOPHAGOGASTRODUODENOSCOPY  (EGD) WITH PROPOFOL  N/A 11/07/2019   Procedure: ESOPHAGOGASTRODUODENOSCOPY (EGD) WITH PROPOFOL ;  Surgeon: Donnald Charleston, MD;  Location: [facility];  Service: Endoscopy;  Laterality: N/A;   EYE SURGERY     HERNIA REPAIR     IR THORACENTESIS ASP PLEURAL SPACE W/IMG GUIDE  04/01/2020   KNEE ARTHROSCOPY Bilateral    LAPAROSCOPIC RIGHT HEMI COLECTOMY Right 03/06/2020   Procedure: LAPAROSCOPIC RIGHT HEMI COLECTOMY WITH TAP BLOCK AND LYSIS OF ADHESIONS;  Surgeon: Teresa Lonni CHRISTELLA, MD;  Location: [facility];  Service: General;  Laterality: Right;   POLYPECTOMY     small adenomatous   TOTAL HIP ARTHROPLASTY Right 06/05/2020   Procedure: ARTHROPLASTY  ANTERIOR APPROACH. RIGHT HIP;  Surgeon: Fidel Rogue, MD;  Location: [facility];  Service: Orthopedics;  Laterality: Right;   TOTAL HIP ARTHROPLASTY Left 12/12/2022   Procedure: TOTAL HIP ARTHROPLASTY ANTERIOR APPROACH;  Surgeon: Fidel Rogue, MD;  Location: [facility];  Service: Orthopedics;  Laterality: Left;   ULNAR TUNNEL RELEASE      FAMILY HISTORY: Family History  Problem Relation Age of Onset   Heart disease Mother    Osteoporosis Mother    Diabetes Father    Pancreatic cancer Father    Bone cancer Sister    Rectal cancer Sister    Thyroid  disease Sister        benign goiter resected   Prostate cancer Brother    Colon cancer Brother    Colon cancer Other     SOCIAL HISTORY: Social History   Socioeconomic History   Marital status: Widowed    Spouse name: Not on file   Number of children: 0   Years of education: college   Highest education level: Not on file  Occupational History   Occupation: retired  Tobacco Use   Smoking status: Never   Smokeless tobacco: Never  Vaping Use   Vaping status: Never Used  Substance and Sexual Activity   Alcohol  use: No   Drug use: No   Sexual activity: Not Currently    Comment: menarche age 49, hysterectomy age 32, HRT x 2-3 mos, G1- miscarriage  Other Topics Concern   Not on file  Social  History Narrative   12/24/20 Widowed, lives alone. Has caregivers 8 hrs a day, no one  at night- 12/29/21     Ambulates independently, uses walker at home intermittently    Drinks decaf only   Patient is right handed.   Social Drivers of Corporate Investment Banker Strain: Not on file  Food Insecurity: No Food Insecurity (12/18/2023)   Hunger Vital Sign    Worried About Running Out of Food in the Last Year: Never true    Ran Out of Food in the Last Year: Never true  Transportation Needs: No Transportation Needs (12/18/2023)   PRAPARE - Administrator, Civil Service (Medical): No    Lack of Transportation (Non-Medical): No  Physical Activity: Not on file  Stress: Not on file  Social Connections: Moderately Isolated (12/18/2023)   Social Connection and Isolation Panel    Frequency of Communication with Friends and Family: Once a week    Frequency of Social Gatherings with Friends and Family: Once a week    Attends Religious Services: 1 to 4 times per year    Active Member of [employer] or Organizations: No    Attends Banker Meetings: 1 to 4 times per year    Marital Status: Widowed  Intimate Partner Violence: Not At Risk (12/18/2023)   Humiliation, Afraid, Rape, and Kick questionnaire    Fear of Current or Ex-Partner: No    Emotionally Abused: No    Physically Abused: No    Sexually Abused: No    PHYSICAL EXAM  GENERAL EXAM/CONSTITUTIONAL: Vitals:  Vitals:   03/27/24 1047  BP: 135/81  Pulse: 99   There is no height or weight on file to calculate BMI. Wt Readings from Last 3 Encounters:  12/23/23 172 lb 9.9 oz (78.3 kg)  11/17/23 150 lb (68 kg)  01/21/23 151 lb (68.5 kg)   Patient is in no distress; well developed, nourished and groomed; neck is supple, appears pale today  MUSCULOSKELETAL: Gait, strength, tone, movements noted in Neurologic exam below  NEUROLOGIC: MENTAL STATUS:     03/28/2018   11:47 AM 08/31/2017   10:44 AM  MMSE - Mini Mental  State Exam  Not completed: --   Orientation to time 5 5  Orientation to Place 4 5  Registration 3 3  Attention/ Calculation 5 4  Recall 2 2  Language- name 2 objects 2 2  Language- repeat 1 1  Language- follow 3 step command 3 3  Language- read & follow direction 1 1  Write a sentence 1 1  Copy design 1 0  Total score 28 27   awake, alert, oriented to person, place and time recent and remote memory intact normal attention and concentration language fluent, comprehension intact, naming intact fund of knowledge appropriate  CRANIAL NERVE:  2nd, 3rd, 4th, 6th - visual fields full to confrontation, extraocular muscles intact, no nystagmus 5th - facial sensation symmetric 7th - facial strength symmetric 8th - hearing intact 9th - palate elevates symmetrically, uvula midline 11th - shoulder shrug symmetric 12th - tongue protrusion midline  MOTOR:  normal bulk and tone, at least antigravity in the BUE.   SENSORY:  normal and symmetric to light touch  COORDINATION:  There is presence of action tremors, right worse then left  GAIT/STATION:  Deferred, using a wheelchair    DIAGNOSTIC DATA (LABS, IMAGING, TESTING) - I reviewed patient records, labs, notes, testing and imaging myself where available.  Lab Results  Component Value Date   WBC 13.1 (H) 12/23/2023   HGB 10.5 (L) 12/23/2023   HCT 30.7 (L)  12/23/2023   MCV 88.0 12/23/2023   PLT 559 (H) 12/23/2023      Component Value Date/Time   NA 127 (L) 12/24/2023 0641   NA 130 (L) 12/24/2020 1143   NA 138 01/04/2013 1212   K 4.0 12/24/2023 0641   K 3.9 01/04/2013 1212   CL 96 (L) 12/24/2023 0641   CO2 22 12/24/2023 0641   CO2 22 01/04/2013 1212   GLUCOSE 178 (H) 12/24/2023 0641   GLUCOSE 285 (H) 01/04/2013 1212   BUN 46 (H) 12/24/2023 0641   BUN 14 12/24/2020 1143   BUN 10.3 01/04/2013 1212   CREATININE 0.45 12/24/2023 0641   CREATININE 0.78 12/01/2013 1659   CREATININE 1.0 01/04/2013 1212   CALCIUM  8.8 (L)  12/24/2023 0641   CALCIUM  9.6 01/04/2013 1212   PROT 4.9 (L) 12/17/2023 1029   PROT 6.3 12/24/2020 1143   PROT 6.4 01/04/2013 1212   ALBUMIN  2.5 (L) 12/17/2023 1029   ALBUMIN  4.2 12/24/2020 1143   ALBUMIN  3.6 01/04/2013 1212   AST 12 (L) 12/17/2023 1029   AST 14 01/04/2013 1212   ALT 12 12/17/2023 1029   ALT 16 01/04/2013 1212   ALKPHOS 57 12/17/2023 1029   ALKPHOS 52 01/04/2013 1212   BILITOT 0.3 12/17/2023 1029   BILITOT <0.2 12/24/2020 1143   BILITOT 1.01 01/04/2013 1212   GFRNONAA >60 12/24/2023 0641   GFRAA >60 01/02/2020 0455   Lab Results  Component Value Date   CHOL 148 01/30/2013   HDL 47.10 01/30/2013   LDLCALC 65 01/30/2013   LDLDIRECT 85.7 09/05/2009   TRIG 179.0 (H) 01/30/2013   Lab Results  Component Value Date   HGBA1C 5.9 (H) 12/17/2023   Lab Results  Component Value Date   VITAMINB12 1,197 (H) 12/28/2022   Lab Results  Component Value Date   TSH 2.959 10/24/2022    Phenytoin  level 7.9 (01/13/22) 7.4 (01/06/22) 11.3 (12/24/20)    ASSESSMENT AND PLAN  89 y.o. year old female  with seizure disorder, essential tremor, type 2 diabetes mellitus, hypertension, right frontal meningioma s/p resection in 2015, colon cancer who is presenting for follow up for her seizure and essential tremors.  For her seizures, she continues to do well, no seizure and no seizure like activity. We will continue her on phenytoin  ER 300 mg nightly.    In terms of her essential tremor, we will continue on the same dose of primidone  but per patient facility is not administering the medication at the right time, we will have to contact the facility to reinforce the need to take primidone  early in the morning before breakfast.  I will see her in 6 months for follow-up or sooner if worse    1. Essential tremor   2. Insomnia, unspecified type   3. Bilateral leg weakness   4. Gait abnormality      Patient Instructions  Continue current medications  Please ensure Primidone  250  mg is given before 8AM and Primidone  150 mg at 4PM. This would ensure that tremors are better manage throughout the day.  Call and left a message for Lyle, Nursing Director at [facility] to ensure patient gets her medications on time.  Continue to follow up with your doctors  Return in 6 months or sooner if worse    Per [place] statutes, patients with seizures are not allowed to drive until they have been seizure-free for six months.  Other recommendations include using caution when using heavy equipment or power tools. Avoid working  on ladders or at heights. Take showers instead of baths.  Do not swim alone.  Ensure the water  temperature is not too high on the home water  heater. Do not go swimming alone. Do not lock yourself in a room alone (i.e. bathroom). When caring for infants or small children, sit down when holding, feeding, or changing them to minimize risk of injury to the child in the event you have a seizure. Maintain good sleep hygiene. Avoid alcohol .  Also recommend adequate sleep, hydration, good diet and minimize stress.   During the Seizure  - First, ensure adequate ventilation and place patients on the floor on their left side  Loosen clothing around the neck and ensure the airway is patent. If the patient is clenching the teeth, do not force the mouth open with any object as this can cause severe damage - Remove all items from the surrounding that can be hazardous. The patient Schall be oblivious to what's happening and Haertel not even know what he or she is doing. If the patient is confused and wandering, either gently guide him/her away and block access to outside areas - Reassure the individual and be comforting - Call 911. In most cases, the seizure ends before EMS arrives. However, there are cases when seizures [place] last over 3 to 5 minutes. Or the individual Greenbaum have developed breathing difficulties or severe injuries. If a pregnant patient or a person with diabetes develops  a seizure, it is prudent to call an ambulance. - Finally, if the patient does not regain full consciousness, then call EMS. Most patients will remain confused for about 45 to 90 minutes after a seizure, so you must use judgment in calling for help. - Avoid restraints but make sure the patient is in a bed with padded side rails - Place the individual in a lateral position with the neck slightly flexed; this will help the saliva drain from the mouth and prevent the tongue from falling backward - Remove all nearby furniture and other hazards from the area - Provide verbal assurance as the individual is regaining consciousness - Provide the patient with privacy if possible - Call for help and start treatment as ordered by the caregiver   After the Seizure (Postictal Stage)  After a seizure, most patients experience confusion, fatigue, muscle pain and/or a headache. Thus, one should permit the individual to sleep. For the next few days, reassurance is essential. Being calm and helping reorient the person is also of importance.  Most seizures are painless and end spontaneously. Seizures are not harmful to others but can lead to complications such as stress on the lungs, brain and the heart. Individuals with prior lung problems H[place] develop labored breathing and respiratory distress.     No orders of the defined types were placed in this encounter.   No orders of the defined types were placed in this encounter.   Return in about 6 months (around 09/24/2024).   Pastor Falling, MD 03/27/2024, 12:18 PM  [facility] [address] [place], [place] [address] [phone]

## 2024-10-26 ENCOUNTER — Ambulatory Visit: Admitting: Neurology
